# Patient Record
Sex: Male | Born: 1935 | Race: White | Hispanic: No | Marital: Married | Smoking: Former smoker
Health system: Southern US, Community
[De-identification: ages and names within clinical notes are randomized; demographics above are authoritative.]

## PROBLEM LIST (undated history)

## (undated) DIAGNOSIS — L989 Disorder of the skin and subcutaneous tissue, unspecified: Secondary | ICD-10-CM

## (undated) DIAGNOSIS — T4145XA Adverse effect of unspecified anesthetic, initial encounter: Secondary | ICD-10-CM

## (undated) DIAGNOSIS — C61 Malignant neoplasm of prostate: Secondary | ICD-10-CM

## (undated) DIAGNOSIS — N400 Enlarged prostate without lower urinary tract symptoms: Secondary | ICD-10-CM

## (undated) DIAGNOSIS — N529 Male erectile dysfunction, unspecified: Secondary | ICD-10-CM

## (undated) DIAGNOSIS — Z8489 Family history of other specified conditions: Secondary | ICD-10-CM

## (undated) DIAGNOSIS — I10 Essential (primary) hypertension: Secondary | ICD-10-CM

## (undated) DIAGNOSIS — M1990 Unspecified osteoarthritis, unspecified site: Secondary | ICD-10-CM

## (undated) DIAGNOSIS — I480 Paroxysmal atrial fibrillation: Secondary | ICD-10-CM

## (undated) DIAGNOSIS — J45909 Unspecified asthma, uncomplicated: Secondary | ICD-10-CM

## (undated) DIAGNOSIS — T8859XA Other complications of anesthesia, initial encounter: Secondary | ICD-10-CM

## (undated) DIAGNOSIS — I5042 Chronic combined systolic (congestive) and diastolic (congestive) heart failure: Secondary | ICD-10-CM

## (undated) DIAGNOSIS — K219 Gastro-esophageal reflux disease without esophagitis: Secondary | ICD-10-CM

## (undated) DIAGNOSIS — E039 Hypothyroidism, unspecified: Secondary | ICD-10-CM

## (undated) DIAGNOSIS — I2510 Atherosclerotic heart disease of native coronary artery without angina pectoris: Secondary | ICD-10-CM

## (undated) DIAGNOSIS — Z952 Presence of prosthetic heart valve: Secondary | ICD-10-CM

## (undated) DIAGNOSIS — I219 Acute myocardial infarction, unspecified: Secondary | ICD-10-CM

## (undated) DIAGNOSIS — R0989 Other specified symptoms and signs involving the circulatory and respiratory systems: Secondary | ICD-10-CM

## (undated) DIAGNOSIS — K602 Anal fissure, unspecified: Secondary | ICD-10-CM

## (undated) DIAGNOSIS — I509 Heart failure, unspecified: Secondary | ICD-10-CM

## (undated) DIAGNOSIS — H353130 Nonexudative age-related macular degeneration, bilateral, stage unspecified: Secondary | ICD-10-CM

## (undated) DIAGNOSIS — Z860100 Personal history of colon polyps, unspecified: Secondary | ICD-10-CM

## (undated) DIAGNOSIS — E669 Obesity, unspecified: Secondary | ICD-10-CM

## (undated) DIAGNOSIS — I350 Nonrheumatic aortic (valve) stenosis: Secondary | ICD-10-CM

## (undated) DIAGNOSIS — T7840XA Allergy, unspecified, initial encounter: Secondary | ICD-10-CM

## (undated) DIAGNOSIS — E785 Hyperlipidemia, unspecified: Secondary | ICD-10-CM

## (undated) DIAGNOSIS — Z951 Presence of aortocoronary bypass graft: Secondary | ICD-10-CM

## (undated) HISTORY — DX: Heart failure, unspecified: I50.9

## (undated) HISTORY — DX: Personal history of colonic polyps: Z86.010

## (undated) HISTORY — DX: Anal fissure, unspecified: K60.2

## (undated) HISTORY — DX: Atherosclerotic heart disease of native coronary artery without angina pectoris: I25.10

## (undated) HISTORY — PX: CYST EXCISION PERINEAL: SHX6278

## (undated) HISTORY — DX: Hyperlipidemia, unspecified: E78.5

## (undated) HISTORY — DX: Allergy, unspecified, initial encounter: T78.40XA

## (undated) HISTORY — DX: Paroxysmal atrial fibrillation: I48.0

## (undated) HISTORY — PX: COLONOSCOPY: SHX174

## (undated) HISTORY — DX: Other specified symptoms and signs involving the circulatory and respiratory systems: R09.89

## (undated) HISTORY — DX: Chronic combined systolic (congestive) and diastolic (congestive) heart failure: I50.42

## (undated) HISTORY — DX: Acute myocardial infarction, unspecified: I21.9

## (undated) HISTORY — PX: MULTIPLE TOOTH EXTRACTIONS: SHX2053

## (undated) HISTORY — DX: Personal history of colon polyps, unspecified: Z86.0100

## (undated) HISTORY — DX: Essential (primary) hypertension: I10

## (undated) HISTORY — PX: HAMMER TOE SURGERY: SHX385

## (undated) HISTORY — PX: JOINT REPLACEMENT: SHX530

## (undated) HISTORY — DX: Gastro-esophageal reflux disease without esophagitis: K21.9

## (undated) HISTORY — DX: Unspecified osteoarthritis, unspecified site: M19.90

## (undated) HISTORY — DX: Male erectile dysfunction, unspecified: N52.9

## (undated) HISTORY — DX: Unspecified asthma, uncomplicated: J45.909

## (undated) HISTORY — PX: CATARACT EXTRACTION W/ INTRAOCULAR LENS  IMPLANT, BILATERAL: SHX1307

## (undated) HISTORY — DX: Nonrheumatic aortic (valve) stenosis: I35.0

## (undated) HISTORY — DX: Malignant neoplasm of prostate: C61

## (undated) HISTORY — DX: Disorder of the skin and subcutaneous tissue, unspecified: L98.9

## (undated) HISTORY — DX: Benign prostatic hyperplasia without lower urinary tract symptoms: N40.0

## (undated) HISTORY — DX: Presence of aortocoronary bypass graft: Z95.1

## (undated) HISTORY — PX: PROSTATE BIOPSY: SHX241

## (undated) HISTORY — PX: MASTECTOMY SUBCUTANEOUS: SUR853

## (undated) HISTORY — PX: ORIF FINGER / THUMB FRACTURE: SUR932

## (undated) HISTORY — PX: ANUS SURGERY: SHX302

## (undated) HISTORY — DX: Obesity, unspecified: E66.9

---

## 2002-07-05 ENCOUNTER — Ambulatory Visit (HOSPITAL_BASED_OUTPATIENT_CLINIC_OR_DEPARTMENT_OTHER): Admission: RE | Admit: 2002-07-05 | Discharge: 2002-07-05 | Payer: Self-pay | Admitting: *Deleted

## 2004-09-21 ENCOUNTER — Encounter: Admission: RE | Admit: 2004-09-21 | Discharge: 2004-09-21 | Payer: Self-pay | Admitting: Neurology

## 2004-10-31 ENCOUNTER — Ambulatory Visit: Payer: Self-pay | Admitting: Family Medicine

## 2005-06-27 ENCOUNTER — Ambulatory Visit: Payer: Self-pay | Admitting: Family Medicine

## 2005-08-26 HISTORY — PX: CORONARY ARTERY BYPASS GRAFT: SHX141

## 2005-10-11 ENCOUNTER — Ambulatory Visit: Payer: Self-pay | Admitting: Family Medicine

## 2005-10-15 ENCOUNTER — Ambulatory Visit: Payer: Self-pay | Admitting: Family Medicine

## 2005-10-18 ENCOUNTER — Ambulatory Visit: Payer: Self-pay | Admitting: Family Medicine

## 2005-10-29 ENCOUNTER — Inpatient Hospital Stay (HOSPITAL_COMMUNITY): Admission: RE | Admit: 2005-10-29 | Discharge: 2005-11-04 | Payer: Self-pay | Admitting: Cardiovascular Disease

## 2005-10-29 HISTORY — PX: CARDIAC CATHETERIZATION: SHX172

## 2005-10-30 DIAGNOSIS — Z951 Presence of aortocoronary bypass graft: Secondary | ICD-10-CM | POA: Insufficient documentation

## 2005-10-30 HISTORY — DX: Presence of aortocoronary bypass graft: Z95.1

## 2005-10-31 ENCOUNTER — Encounter: Payer: Self-pay | Admitting: Internal Medicine

## 2005-11-21 ENCOUNTER — Encounter: Admission: RE | Admit: 2005-11-21 | Discharge: 2005-11-21 | Payer: Self-pay | Admitting: Cardiothoracic Surgery

## 2005-11-28 ENCOUNTER — Encounter (HOSPITAL_COMMUNITY): Admission: RE | Admit: 2005-11-28 | Discharge: 2006-02-26 | Payer: Self-pay | Admitting: Cardiology

## 2006-04-02 ENCOUNTER — Ambulatory Visit: Payer: Self-pay | Admitting: Family Medicine

## 2006-06-06 ENCOUNTER — Ambulatory Visit: Payer: Self-pay | Admitting: Family Medicine

## 2006-06-30 ENCOUNTER — Ambulatory Visit: Payer: Self-pay | Admitting: Family Medicine

## 2006-06-30 LAB — CONVERTED CEMR LAB
ALT: 27 units/L (ref 0–40)
AST: 25 units/L (ref 0–37)
Albumin: 3.5 g/dL (ref 3.5–5.2)
Alkaline Phosphatase: 59 units/L (ref 39–117)
BUN: 9 mg/dL (ref 6–23)
Basophils Absolute: 0.1 10*3/uL (ref 0.0–0.1)
Basophils Relative: 1 % (ref 0.0–1.0)
CO2: 28 meq/L (ref 19–32)
Calcium: 8.7 mg/dL (ref 8.4–10.5)
Chloride: 103 meq/L (ref 96–112)
Chol/HDL Ratio, serum: 4.6
Cholesterol: 158 mg/dL (ref 0–200)
Creatinine, Ser: 0.9 mg/dL (ref 0.4–1.5)
Eosinophil percent: 3.4 % (ref 0.0–5.0)
GFR calc non Af Amer: 89 mL/min
Glomerular Filtration Rate, Af Am: 107 mL/min/{1.73_m2}
Glucose, Bld: 87 mg/dL (ref 70–99)
HCT: 45.5 % (ref 39.0–52.0)
HDL: 34.7 mg/dL — ABNORMAL LOW (ref >39.0)
Hemoglobin: 14.9 g/dL (ref 13.0–17.0)
LDL Cholesterol: 95 mg/dL (ref 0–99)
Lymphocytes Relative: 24.7 % (ref 12.0–46.0)
MCHC: 32.8 g/dL (ref 30.0–36.0)
MCV: 84.3 fL (ref 78.0–100.0)
Monocytes Absolute: 0.7 10*3/uL (ref 0.2–0.7)
Monocytes Relative: 7.1 % (ref 3.0–11.0)
Neutro Abs: 6.1 10*3/uL (ref 1.4–7.7)
Neutrophils Relative %: 63.8 % (ref 43.0–77.0)
PSA: 0.82 ng/mL (ref 0.10–4.00)
Platelets: 304 10*3/uL (ref 150–400)
Potassium: 3.8 meq/L (ref 3.5–5.1)
RBC: 5.4 M/uL (ref 4.22–5.81)
RDW: 15.5 % — ABNORMAL HIGH (ref 11.5–14.6)
Sodium: 139 meq/L (ref 135–145)
TSH: 3.54 microintl units/mL (ref 0.35–5.50)
Total Bilirubin: 1.2 mg/dL (ref 0.3–1.2)
Total Protein: 7.6 g/dL (ref 6.0–8.3)
Triglyceride fasting, serum: 143 mg/dL (ref 0–149)
VLDL: 29 mg/dL (ref 0–40)
WBC: 9.6 10*3/uL (ref 4.5–10.5)

## 2007-06-01 ENCOUNTER — Encounter: Payer: Self-pay | Admitting: Family Medicine

## 2007-06-05 ENCOUNTER — Ambulatory Visit: Payer: Self-pay | Admitting: Family Medicine

## 2007-07-03 ENCOUNTER — Ambulatory Visit: Payer: Self-pay | Admitting: Family Medicine

## 2007-07-03 DIAGNOSIS — J309 Allergic rhinitis, unspecified: Secondary | ICD-10-CM | POA: Insufficient documentation

## 2007-07-03 DIAGNOSIS — Z872 Personal history of diseases of the skin and subcutaneous tissue: Secondary | ICD-10-CM

## 2007-07-03 DIAGNOSIS — E785 Hyperlipidemia, unspecified: Secondary | ICD-10-CM | POA: Insufficient documentation

## 2007-07-03 DIAGNOSIS — I252 Old myocardial infarction: Secondary | ICD-10-CM | POA: Insufficient documentation

## 2007-07-03 DIAGNOSIS — I10 Essential (primary) hypertension: Secondary | ICD-10-CM | POA: Insufficient documentation

## 2007-07-03 DIAGNOSIS — Z860100 Personal history of colon polyps, unspecified: Secondary | ICD-10-CM | POA: Insufficient documentation

## 2007-07-03 DIAGNOSIS — L570 Actinic keratosis: Secondary | ICD-10-CM

## 2007-07-03 DIAGNOSIS — E291 Testicular hypofunction: Secondary | ICD-10-CM

## 2007-08-10 ENCOUNTER — Telehealth: Payer: Self-pay | Admitting: Family Medicine

## 2007-10-21 ENCOUNTER — Telehealth (INDEPENDENT_AMBULATORY_CARE_PROVIDER_SITE_OTHER): Payer: Self-pay | Admitting: *Deleted

## 2007-10-21 ENCOUNTER — Ambulatory Visit: Payer: Self-pay | Admitting: Family Medicine

## 2007-11-20 ENCOUNTER — Telehealth: Payer: Self-pay | Admitting: Family Medicine

## 2007-12-30 ENCOUNTER — Encounter: Payer: Self-pay | Admitting: Family Medicine

## 2008-02-01 ENCOUNTER — Ambulatory Visit: Payer: Self-pay | Admitting: Family Medicine

## 2008-02-01 DIAGNOSIS — N401 Enlarged prostate with lower urinary tract symptoms: Secondary | ICD-10-CM | POA: Insufficient documentation

## 2008-02-01 DIAGNOSIS — M545 Low back pain, unspecified: Secondary | ICD-10-CM | POA: Insufficient documentation

## 2008-02-04 ENCOUNTER — Encounter: Payer: Self-pay | Admitting: Family Medicine

## 2008-02-08 ENCOUNTER — Encounter: Payer: Self-pay | Admitting: Internal Medicine

## 2008-02-08 ENCOUNTER — Encounter: Payer: Self-pay | Admitting: Family Medicine

## 2008-02-11 ENCOUNTER — Encounter: Payer: Self-pay | Admitting: Family Medicine

## 2008-02-12 ENCOUNTER — Telehealth (INDEPENDENT_AMBULATORY_CARE_PROVIDER_SITE_OTHER): Payer: Self-pay | Admitting: *Deleted

## 2008-05-13 ENCOUNTER — Ambulatory Visit: Payer: Self-pay | Admitting: Family Medicine

## 2008-05-13 DIAGNOSIS — S7000XA Contusion of unspecified hip, initial encounter: Secondary | ICD-10-CM

## 2008-05-16 ENCOUNTER — Encounter: Admission: RE | Admit: 2008-05-16 | Discharge: 2008-05-23 | Payer: Self-pay | Admitting: Family Medicine

## 2008-05-24 ENCOUNTER — Encounter: Payer: Self-pay | Admitting: Family Medicine

## 2008-06-06 ENCOUNTER — Ambulatory Visit: Payer: Self-pay | Admitting: Internal Medicine

## 2008-06-30 ENCOUNTER — Ambulatory Visit: Payer: Self-pay | Admitting: Internal Medicine

## 2008-06-30 ENCOUNTER — Encounter: Payer: Self-pay | Admitting: Internal Medicine

## 2008-06-30 HISTORY — PX: COLONOSCOPY: SHX174

## 2008-07-01 ENCOUNTER — Encounter: Payer: Self-pay | Admitting: Internal Medicine

## 2008-07-07 ENCOUNTER — Ambulatory Visit: Payer: Self-pay | Admitting: Family Medicine

## 2008-07-07 DIAGNOSIS — M1990 Unspecified osteoarthritis, unspecified site: Secondary | ICD-10-CM | POA: Insufficient documentation

## 2008-07-07 DIAGNOSIS — N400 Enlarged prostate without lower urinary tract symptoms: Secondary | ICD-10-CM

## 2008-09-09 ENCOUNTER — Ambulatory Visit: Payer: Self-pay | Admitting: Cardiothoracic Surgery

## 2008-09-21 ENCOUNTER — Ambulatory Visit: Payer: Self-pay | Admitting: Family Medicine

## 2008-09-21 DIAGNOSIS — IMO0002 Reserved for concepts with insufficient information to code with codable children: Secondary | ICD-10-CM | POA: Insufficient documentation

## 2008-12-12 ENCOUNTER — Ambulatory Visit: Payer: Self-pay | Admitting: Family Medicine

## 2008-12-12 DIAGNOSIS — R252 Cramp and spasm: Secondary | ICD-10-CM | POA: Insufficient documentation

## 2008-12-15 LAB — CONVERTED CEMR LAB: Vit D, 25-Hydroxy: 33 ng/mL (ref 30–89)

## 2008-12-26 ENCOUNTER — Telehealth: Payer: Self-pay | Admitting: Family Medicine

## 2009-01-10 ENCOUNTER — Encounter: Payer: Self-pay | Admitting: Family Medicine

## 2009-04-21 ENCOUNTER — Telehealth: Payer: Self-pay | Admitting: Family Medicine

## 2009-05-08 ENCOUNTER — Ambulatory Visit: Payer: Self-pay | Admitting: Internal Medicine

## 2009-05-08 DIAGNOSIS — R011 Cardiac murmur, unspecified: Secondary | ICD-10-CM | POA: Insufficient documentation

## 2009-05-08 DIAGNOSIS — R0989 Other specified symptoms and signs involving the circulatory and respiratory systems: Secondary | ICD-10-CM

## 2009-05-10 DIAGNOSIS — I25810 Atherosclerosis of coronary artery bypass graft(s) without angina pectoris: Secondary | ICD-10-CM | POA: Insufficient documentation

## 2009-05-19 ENCOUNTER — Telehealth: Payer: Self-pay | Admitting: Internal Medicine

## 2009-06-06 ENCOUNTER — Ambulatory Visit: Payer: Self-pay | Admitting: Family Medicine

## 2009-06-09 ENCOUNTER — Telehealth: Payer: Self-pay | Admitting: Internal Medicine

## 2009-06-15 ENCOUNTER — Encounter: Payer: Self-pay | Admitting: Internal Medicine

## 2009-06-15 ENCOUNTER — Ambulatory Visit: Payer: Self-pay

## 2009-08-15 ENCOUNTER — Ambulatory Visit: Payer: Self-pay | Admitting: Family Medicine

## 2009-08-15 DIAGNOSIS — L02619 Cutaneous abscess of unspecified foot: Secondary | ICD-10-CM

## 2009-08-15 DIAGNOSIS — L03119 Cellulitis of unspecified part of limb: Secondary | ICD-10-CM

## 2009-08-16 ENCOUNTER — Encounter: Payer: Self-pay | Admitting: Family Medicine

## 2009-08-17 ENCOUNTER — Ambulatory Visit: Payer: Self-pay | Admitting: Family Medicine

## 2009-09-08 ENCOUNTER — Ambulatory Visit: Payer: Self-pay | Admitting: Family Medicine

## 2009-09-08 LAB — CONVERTED CEMR LAB
Glucose, Urine, Semiquant: NEGATIVE
Protein, U semiquant: NEGATIVE
Specific Gravity, Urine: 1.02
Urobilinogen, UA: 0.2
WBC Urine, dipstick: NEGATIVE
pH: 5.5

## 2009-09-12 ENCOUNTER — Ambulatory Visit: Payer: Self-pay | Admitting: Family Medicine

## 2009-09-12 DIAGNOSIS — M109 Gout, unspecified: Secondary | ICD-10-CM | POA: Insufficient documentation

## 2009-09-12 LAB — CONVERTED CEMR LAB
ALT: 22 units/L (ref 0–53)
Albumin: 3.9 g/dL (ref 3.5–5.2)
Basophils Absolute: 0.1 10*3/uL (ref 0.0–0.1)
Basophils Relative: 0.9 % (ref 0.0–3.0)
Bilirubin, Direct: <0.1 mg/dL (ref 0.0–0.3)
Chloride: 106 meq/L (ref 96–112)
Creatinine, Ser: 0.78 mg/dL (ref 0.40–1.50)
Glucose, Bld: 89 mg/dL (ref 70–99)
Lymphocytes Relative: 22.5 % (ref 12.0–46.0)
Lymphs Abs: 2 10*3/uL (ref 0.7–4.0)
MCHC: 32.6 g/dL (ref 30.0–36.0)
Monocytes Absolute: 0.8 10*3/uL (ref 0.1–1.0)
PSA: 0.73 ng/mL (ref 0.10–4.00)
Potassium: 4.5 meq/L (ref 3.5–5.3)
Sodium: 141 meq/L (ref 135–145)
TSH: 4.67 microintl units/mL (ref 0.35–5.50)
Total Bilirubin: 0.7 mg/dL (ref 0.3–1.2)
Total Protein: 7.2 g/dL (ref 6.0–8.3)
WBC: 8.9 10*3/uL (ref 4.5–10.5)

## 2009-10-31 ENCOUNTER — Ambulatory Visit: Payer: Self-pay | Admitting: Internal Medicine

## 2009-11-17 ENCOUNTER — Telehealth: Payer: Self-pay | Admitting: Family Medicine

## 2009-11-26 ENCOUNTER — Encounter: Payer: Self-pay | Admitting: Internal Medicine

## 2009-11-30 ENCOUNTER — Telehealth (INDEPENDENT_AMBULATORY_CARE_PROVIDER_SITE_OTHER): Payer: Self-pay | Admitting: *Deleted

## 2009-12-04 ENCOUNTER — Ambulatory Visit: Payer: Self-pay | Admitting: Internal Medicine

## 2009-12-04 ENCOUNTER — Encounter (HOSPITAL_COMMUNITY): Admission: RE | Admit: 2009-12-04 | Discharge: 2010-01-25 | Payer: Self-pay | Admitting: Internal Medicine

## 2009-12-04 ENCOUNTER — Ambulatory Visit: Payer: Self-pay

## 2009-12-12 ENCOUNTER — Telehealth: Payer: Self-pay | Admitting: Internal Medicine

## 2010-05-30 ENCOUNTER — Ambulatory Visit: Payer: Self-pay | Admitting: Internal Medicine

## 2010-05-30 ENCOUNTER — Ambulatory Visit: Payer: Self-pay | Admitting: Family Medicine

## 2010-05-30 ENCOUNTER — Telehealth (INDEPENDENT_AMBULATORY_CARE_PROVIDER_SITE_OTHER): Payer: Self-pay | Admitting: *Deleted

## 2010-06-15 ENCOUNTER — Ambulatory Visit: Payer: Self-pay | Admitting: Internal Medicine

## 2010-06-22 ENCOUNTER — Encounter: Payer: Self-pay | Admitting: Internal Medicine

## 2010-06-22 LAB — CONVERTED CEMR LAB
ALT: 23 units/L (ref 0–53)
Alkaline Phosphatase: 65 units/L (ref 39–117)
Bilirubin, Direct: 0.2 mg/dL (ref 0.0–0.3)
Chloride: 104 meq/L (ref 96–112)
Creatinine, Ser: 0.8 mg/dL (ref 0.4–1.5)
Potassium: 4.3 meq/L (ref 3.5–5.1)
Total Bilirubin: 1.1 mg/dL (ref 0.3–1.2)
Total Protein: 6.8 g/dL (ref 6.0–8.3)

## 2010-08-13 ENCOUNTER — Encounter: Payer: Self-pay | Admitting: Family Medicine

## 2010-08-14 ENCOUNTER — Encounter: Payer: Self-pay | Admitting: Internal Medicine

## 2010-08-16 ENCOUNTER — Telehealth: Payer: Self-pay | Admitting: Internal Medicine

## 2010-08-24 ENCOUNTER — Ambulatory Visit
Admission: RE | Admit: 2010-08-24 | Discharge: 2010-08-24 | Payer: Self-pay | Source: Home / Self Care | Attending: Family Medicine | Admitting: Family Medicine

## 2010-08-26 HISTORY — PX: REPLACEMENT TOTAL KNEE BILATERAL: SUR1225

## 2010-08-26 HISTORY — PX: CATARACT EXTRACTION, BILATERAL: SHX1313

## 2010-09-05 ENCOUNTER — Other Ambulatory Visit: Payer: Self-pay | Admitting: Family Medicine

## 2010-09-05 ENCOUNTER — Ambulatory Visit
Admission: RE | Admit: 2010-09-05 | Discharge: 2010-09-05 | Payer: Self-pay | Source: Home / Self Care | Attending: Family Medicine | Admitting: Family Medicine

## 2010-09-05 ENCOUNTER — Telehealth: Payer: Self-pay | Admitting: Family Medicine

## 2010-09-05 LAB — CONVERTED CEMR LAB
Specific Gravity, Urine: 1.02
Urobilinogen, UA: 0.2
WBC Urine, dipstick: NEGATIVE
pH: 6.5

## 2010-09-05 LAB — CBC WITH DIFFERENTIAL/PLATELET
Basophils Absolute: 0.1 10*3/uL (ref 0.0–0.1)
Basophils Relative: 0.4 % (ref 0.0–3.0)
Eosinophils Absolute: 0.3 10*3/uL (ref 0.0–0.7)
Eosinophils Relative: 2.3 % (ref 0.0–5.0)
HCT: 42.9 % (ref 39.0–52.0)
Hemoglobin: 14.3 g/dL (ref 13.0–17.0)
Lymphocytes Relative: 18.8 % (ref 12.0–46.0)
Lymphs Abs: 2.5 10*3/uL (ref 0.7–4.0)
MCHC: 33.3 g/dL (ref 30.0–36.0)
MCV: 86.9 fl (ref 78.0–100.0)
Monocytes Absolute: 0.7 10*3/uL (ref 0.1–1.0)
Monocytes Relative: 5 % (ref 3.0–12.0)
Neutro Abs: 9.6 10*3/uL — ABNORMAL HIGH (ref 1.4–7.7)
Neutrophils Relative %: 73.5 % (ref 43.0–77.0)
Platelets: 281 10*3/uL (ref 150.0–400.0)
RBC: 4.93 Mil/uL (ref 4.22–5.81)
RDW: 15.4 % — ABNORMAL HIGH (ref 11.5–14.6)
WBC: 13.1 10*3/uL — ABNORMAL HIGH (ref 4.5–10.5)

## 2010-09-05 LAB — LIPID PANEL
Cholesterol: 130 mg/dL (ref 0–200)
HDL: 39.2 mg/dL (ref >39.00)
LDL Cholesterol: 75 mg/dL (ref 0–99)
Total CHOL/HDL Ratio: 3
Triglycerides: 78 mg/dL (ref 0.0–149.0)
VLDL: 15.6 mg/dL (ref 0.0–40.0)

## 2010-09-05 LAB — BASIC METABOLIC PANEL
BUN: 18 mg/dL (ref 6–23)
CO2: 28 mEq/L (ref 19–32)
Calcium: 8.7 mg/dL (ref 8.4–10.5)
Chloride: 103 mEq/L (ref 96–112)
Creatinine, Ser: 0.8 mg/dL (ref 0.4–1.5)
GFR: 100.38 mL/min (ref >60.00)
Glucose, Bld: 79 mg/dL (ref 70–99)
Potassium: 4.6 mEq/L (ref 3.5–5.1)
Sodium: 139 mEq/L (ref 135–145)

## 2010-09-05 LAB — TSH: TSH: 3.04 u[IU]/mL (ref 0.35–5.50)

## 2010-09-05 LAB — HEPATIC FUNCTION PANEL
ALT: 32 U/L (ref 0–53)
AST: 26 U/L (ref 0–37)
Albumin: 3.3 g/dL — ABNORMAL LOW (ref 3.5–5.2)
Alkaline Phosphatase: 67 U/L (ref 39–117)
Bilirubin, Direct: 0.1 mg/dL (ref 0.0–0.3)
Total Bilirubin: 0.7 mg/dL (ref 0.3–1.2)
Total Protein: 6.9 g/dL (ref 6.0–8.3)

## 2010-09-05 LAB — PSA: PSA: 0.93 ng/mL (ref 0.10–4.00)

## 2010-09-06 ENCOUNTER — Ambulatory Visit
Admission: RE | Admit: 2010-09-06 | Discharge: 2010-09-06 | Payer: Self-pay | Source: Home / Self Care | Attending: Family Medicine | Admitting: Family Medicine

## 2010-09-06 DIAGNOSIS — J209 Acute bronchitis, unspecified: Secondary | ICD-10-CM | POA: Insufficient documentation

## 2010-09-11 ENCOUNTER — Telehealth: Payer: Self-pay

## 2010-09-13 ENCOUNTER — Ambulatory Visit
Admission: RE | Admit: 2010-09-13 | Discharge: 2010-09-13 | Payer: Self-pay | Source: Home / Self Care | Attending: Family Medicine | Admitting: Family Medicine

## 2010-09-23 LAB — CONVERTED CEMR LAB
ALT: 38 units/L (ref 0–53)
AST: 37 units/L (ref 0–37)
Albumin: 3.4 g/dL — ABNORMAL LOW (ref 3.5–5.2)
Albumin: 3.5 g/dL (ref 3.5–5.2)
BUN: 11 mg/dL (ref 6–23)
BUN: 9 mg/dL (ref 6–23)
Bilirubin, Direct: 0.1 mg/dL (ref 0.0–0.3)
Bilirubin, Direct: 0.2 mg/dL (ref 0.0–0.3)
CO2: 29 meq/L (ref 19–32)
Calcium: 8.8 mg/dL (ref 8.4–10.5)
Calcium: 8.9 mg/dL (ref 8.4–10.5)
Chloride: 103 meq/L (ref 96–112)
Chloride: 106 meq/L (ref 96–112)
Cholesterol: 125 mg/dL (ref 0–200)
Cholesterol: 131 mg/dL (ref 0–200)
Creatinine, Ser: 0.7 mg/dL (ref 0.4–1.5)
Creatinine, Ser: 0.8 mg/dL (ref 0.4–1.5)
Eosinophils Absolute: 0.6 10*3/uL (ref 0.0–0.6)
GFR calc Af Amer: 143 mL/min
Glucose, Bld: 98 mg/dL (ref 70–99)
HCT: 41.9 % (ref 39.0–52.0)
HDL: 31.1 mg/dL — ABNORMAL LOW (ref >39.0)
HDL: 36.5 mg/dL — ABNORMAL LOW (ref >39.0)
Hemoglobin: 14.2 g/dL (ref 13.0–17.0)
Hemoglobin: 14.4 g/dL (ref 13.0–17.0)
Lymphocytes Relative: 25.2 % (ref 12.0–46.0)
MCHC: 33.7 g/dL (ref 30.0–36.0)
MCV: 86.5 fL (ref 78.0–100.0)
Neutro Abs: 5 10*3/uL (ref 1.4–7.7)
Neutro Abs: 5.1 10*3/uL (ref 1.4–7.7)
PSA: 0.75 ng/mL (ref 0.10–4.00)
Potassium: 4.2 meq/L (ref 3.5–5.1)
RDW: 13.9 % (ref 11.5–14.6)
Sodium: 139 meq/L (ref 135–145)
TSH: 3.27 microintl units/mL (ref 0.35–5.50)
Total Protein: 6.7 g/dL (ref 6.0–8.3)
Total Protein: 7.4 g/dL (ref 6.0–8.3)
Triglycerides: 103 mg/dL (ref 0–149)
VLDL: 21 mg/dL (ref 0–40)
WBC: 8.7 10*3/uL (ref 4.5–10.5)
WBC: 8.8 10*3/uL (ref 4.5–10.5)

## 2010-09-27 NOTE — Assessment & Plan Note (Signed)
Summary: 6 month rov./sl   Referring Provider:  Jethro Bolus Primary Provider:  Nelwyn Salisbury MD   History of Present Illness: Justin [name] is a 77 y/o male with a h/o HTN, HL and CAD s/p CABG 2007. Previously followed by Dr. Shelva Majestic we saw him fo rthe first time at the end of last year to establish long-term cardiac care.  Carotid u/s in 10/10: 0-39% bilaterally.   Had nuclear stress test 4/11 EF 54%. Inferior, inferoseptal and apical defect consistent with scar and possible soft tissue attenuation.  No signficiant ischemia.  Returns for f/u. 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 Remains active. Has lost 40 pounds over past year. No CP or dyspnea.  Compliant with meds but not taking BP regularly.    Most recent cholesterol TC 153, LDL 87 HDL 43 TG 116. HgBa1c 6.0 CRP 5.0  on Lipitor 10.   Current Medications (verified): 1)  Allegra 180 Mg Tabs (Fexofenadine Hcl) .[name].. 1 By Mouth Once Daily 2)  Aspirin 325 Mg Tabs (Aspirin) .[name].. 1 Qd 3)  Niacin 500 Mg Tabs (Niacin) .... Once Daily 4)  Fish Oil   Oil (Fish Oil) .... 3 By Mouth Once Daily 5)  Multivitamins   Tabs (Multiple Vitamin) .... 2 By Mouth Once Daily 6)  Enablex 7.5 Mg Xr24h-Tab (Darifenacin Hydrobromide) .... Once Daily 7)  Lipitor 20 Mg Tabs (Atorvastatin Calcium) .... Take One Tablet By Mouth Daily. 8)  Vitamin D 1000 Unit  Tabs (Cholecalciferol) .[name].. 1by Mouth Once Daily 9)  Androgel Pump 1 % Gel (Testosterone) .[name].. 10g Every Day 10)  Ketoconazole 2 % Crea (Ketoconazole) .... Apply Three Times A Day As Needed 11)  Cialis 5 Mg Tabs (Tadalafil) .... As Needed 12)  Diclofenac Sodium 50 Mg Tbec (Diclofenac Sodium) .... Two Times A Day 13)  Afrin Nasal Spray 0.05 % Soln (Oxymetazoline Hcl) .... Once Daily 14)  Ramipril 2.5 Mg Caps (Ramipril) .... Take One Capsule By Mouth Daily  Allergies (verified): 1)  ! Sulfa 2)  ! * Mold 3)  ! * Shrimp 4)  ! * Peanuts  Past History:  Past  Medical History: Last updated: 10/31/2009 Allergic rhinitis (sees Dr. Corinda Gubler) Hyperlipidemia CAD, sees Dr. Arvilla Meres    --s/p Myocardial infarction, hx of    --s/p CABG 2007 Carotid bruits    --carotid u/s 10/10: 0-39% bilaterally Colonic polyps, hx of (sees Dr. Marina Goodell) ED Hypertension precancerous skin lesions (sees Dr. Terri Piedra) Osteoarthritis, especially in left knee, sees Dr. Dorene Grebe Benign prostatic hypertrophy, sees Dr. Vonita Moss Obesity Gout  Review of Systems       As per HPI and past medical history; otherwise all systems negative.   Vital Signs:  Patient profile:   77 year old male Height:      68 inches Weight:      250 pounds BMI:     38.15 Pulse rate:   72 / minute Resp:     16 per minute BP sitting:   138 / 76  (right arm)  Vitals Entered By: Marrion Coy, CNA (May 30, 2010 3:22 PM)  Physical Exam  General:  Obese well appearing. no resp difficulty HEENT: normal Neck: supple. no JVD. Carotids 2+ bilat; bilteral radiated bruits. No lymphadenopathy or thryomegaly appreciated. Cor: PMI nondisplaced. Regular rate & rhythm. No rubs, gallops. 2/6  AS murmur. Lungs: clear Abdomen: obese soft, nontender, nondistended. Good bowel sounds. Extremities: no cyanosis, clubbing, rash, 1+ edema Neuro: alert &  orientedx3, cranial nerves grossly intact. moves all 4 extremities w/o difficulty. affect pleasant    Impression & Recommendations:  Problem # 1:  CAD, AUTOLOGOUS BYPASS GRAFT (ICD-414.02) Stable. No evidence of ischemia on recent Myoview. Continue current regimen.  Problem # 2:  HYPERLIPIDEMIA (ICD-272.4) Due for repeat lipids. Check CMET and lipids. Goal LDL < 70.   Problem # 3:  HYPERTENSION (ICD-401.9) BP mildly elevated; increase remipril to 5 once daily   Problem # 4:  MURMUR (ICD-785.2) AS murmur on exam. Check echo at next visit.   Other Orders: EKG w/ Interpretation (93000)  Patient Instructions: 1)  Your physician recommends  that you schedule a follow-up appointment in: 6 months with Dr Gala Romney 2)  Your physician has recommended you make the following change in your medication: increase Ramipril 5 mg a day 3)  Your physician recommends that you return for a FASTING lipid profile: and CMP 272.0  414.01 v58.69 4)  Your physician has requested that you have an echocardiogram.  Echocardiography is a painless test that uses sound waves to create images of your heart. It provides your doctor with information about the size and shape of your heart and how well your heart's chambers and valves are working.  This procedure takes approximately one hour. There are no restrictions for this procedure.  Due in 6 months Prescriptions: RAMIPRIL 5 MG CAPS (RAMIPRIL) one daily  #90 x 3   Entered by:   Charolotte Capuchin, RN   Authorized by:   Dolores Patty, MD, [facility]   Signed by:   Charolotte Capuchin, RN on 05/30/2010   Method used:   Electronically to        [facility]* (retail)       [address]       [place], [place]  [address]       Ph: [phone]       Fax: [phone]   RxID:   [id number]

## 2010-09-27 NOTE — Assessment & Plan Note (Signed)
Summary: continued cough/dm   Vital Signs:  Patient profile:   77 year old male Weight:      252 pounds O2 Sat:      96 % Temp:     98 degrees F Pulse rate:   70 / minute BP sitting:   130 / 74  (left arm) Cuff size:   large  Vitals Entered By: Pura Spice, RN (September 06, 2010 11:32 AM) CC: dry cough lingering on     History of Present Illness: Here for continued wheezing, chest congestion, and a dry cough after 3 weeks. he had a Zpack at the beginning which did not help. H ehad a prednisone shot which did help somewhat for about 24 hours. No fever or chest pain.   Allergies: 1)  ! Sulfa 2)  ! * Mold 3)  ! * Shrimp 4)  ! * Peanuts  Past History:  Past Medical History: Reviewed history from 10/31/2009 and no changes required. Allergic rhinitis (sees Dr. Corinda Gubler) Hyperlipidemia CAD, sees Dr. Arvilla Meres    --s/p Myocardial infarction, hx of    --s/p CABG 2007 Carotid bruits    --carotid u/s 10/10: 0-39% bilaterally Colonic polyps, hx of (sees Dr. Marina Goodell) ED Hypertension precancerous skin lesions (sees Dr. Terri Piedra) Osteoarthritis, especially in left knee, sees Dr. Dorene Grebe Benign prostatic hypertrophy, sees Dr. Vonita Moss Obesity Gout  Review of Systems  The patient denies anorexia, fever, weight loss, weight gain, vision loss, decreased hearing, hoarseness, chest pain, syncope, dyspnea on exertion, peripheral edema, headaches, hemoptysis, abdominal pain, melena, hematochezia, severe indigestion/heartburn, hematuria, incontinence, genital sores, muscle weakness, suspicious skin lesions, transient blindness, difficulty walking, depression, unusual weight change, abnormal bleeding, enlarged lymph nodes, angioedema, breast masses, and testicular masses.    Physical Exam  General:  Well-developed,well-nourished,in no acute distress; alert,appropriate and cooperative throughout examination Head:  Normocephalic and atraumatic without obvious abnormalities. No  apparent alopecia or balding. Eyes:  No corneal or conjunctival inflammation noted. EOMI. Perrla. Funduscopic exam benign, without hemorrhages, exudates or papilledema. Vision grossly normal. Ears:  External ear exam shows no significant lesions or deformities.  Otoscopic examination reveals clear canals, tympanic membranes are intact bilaterally without bulging, retraction, inflammation or discharge. Hearing is grossly normal bilaterally. Nose:  External nasal examination shows no deformity or inflammation. Nasal mucosa are pink and moist without lesions or exudates. Mouth:  Oral mucosa and oropharynx without lesions or exudates.  Teeth in good repair. Neck:  No deformities, masses, or tenderness noted. Lungs:  soft rhonchi only . Voice is hoarse    Impression & Recommendations:  Problem # 1:  ACUTE BRONCHITIS (ICD-466.0)  His updated medication list for this problem includes:    Hydromet 5-1.5 Mg/34ml Syrp (Hydrocodone-homatropine) .[name]... 1 tsp q 4 hours as needed pain    Levaquin 500 Mg Tabs (Levofloxacin) ..... Once daily  Orders: T-2 View CXR (71020TC)  Complete Medication List: 1)  Allegra 180 Mg Tabs (Fexofenadine hcl) .[name].. 1 by mouth once daily 2)  Aspirin 325 Mg Tabs (Aspirin) .[name].. 1 qd 3)  Niacin 500 Mg Tabs (Niacin) .... Once daily 4)  Fish Oil Oil (Fish oil) .... 3 by mouth once daily 5)  Multivitamins Tabs (Multiple vitamin) .... 2 by mouth once daily 6)  Enablex 7.5 Mg Xr24h-tab (Darifenacin hydrobromide) .... Once daily 7)  Lipitor 20 Mg Tabs (Atorvastatin calcium) .... Take one tablet by mouth daily. 8)  Vitamin D 1000 Unit Tabs (Cholecalciferol) .[name].. 1by mouth once daily 9)  Androgel Pump 1 %  Gel (Testosterone) .[name].. 10g every day 10)  Ketoconazole 2 % Crea (Ketoconazole) .... Apply three times a day as needed 11)  Cialis 5 Mg Tabs (Tadalafil) .... As needed 12)  Diclofenac Sodium 50 Mg Tbec (Diclofenac sodium) .... Two times a day 13)  Afrin Nasal Spray 0.05 % Soln  (Oxymetazoline hcl) .... Once daily 14)  Ramipril 5 Mg Caps (Ramipril) .... One daily 15)  Hydromet 5-1.5 Mg/62ml Syrp (Hydrocodone-homatropine) .[name].. 1 tsp q 4 hours as needed pain 16)  Levaquin 500 Mg Tabs (Levofloxacin) .... Once daily 17)  Prednisone (pak) 10 Mg Tabs (Prednisone) .... As directed for 12 days  Patient Instructions: 1)  get a CXR today.  Prescriptions: PREDNISONE (PAK) 10 MG TABS (PREDNISONE) as directed for 12 days  #1 x 0   Entered and Authorized by:   Nelwyn Salisbury MD   Signed by:   Nelwyn Salisbury MD on 09/06/2010   Method used:   Electronically to        [facility]* (retail)       [address]       [place], [place]  [address]       Ph: [phone]       Fax: [phone]   RxID:   [id number] LEVAQUIN 500 MG TABS (LEVOFLOXACIN) once daily  #10 x 0   Entered and Authorized by:   Nelwyn Salisbury MD   Signed by:   Nelwyn Salisbury MD on 09/06/2010   Method used:   Electronically to        [facility]* (retail)       [address]       [place], [place]  [address]       Ph: [phone]       Fax: [phone]   RxID:   [id number]    Orders Added: 1)  Est. Patient Level IV [23557] 2)  T-2 View CXR [71020TC]

## 2010-09-27 NOTE — Progress Notes (Signed)
Summary: Patients Blood Pressure Log  Patients Blood Pressure Log   Imported By: Roderic Ovens 12/22/2009 12:35:24  _____________________________________________________________________  External Attachment:    Type:   Image     Comment:   External Document

## 2010-09-27 NOTE — Progress Notes (Signed)
Summary: Pt called to check on status of med. Waiting at pharmacy  Phone Note Call from Patient Call back at Home Phone [phone]   Caller: Patient Call For: Nelwyn Salisbury MD Summary of Call: Pt cannot sleep on the prednisone right now, and would like RX to help him sleep. [facility] Initial call taken by: Lynann Beaver CMA AAMA,  September 11, 2010 9:36 AM  Follow-up for Phone Call        call in Temazepam 30 mg at bedtime , #30 with 2 rf  Follow-up by: Nelwyn Salisbury MD,  September 11, 2010 12:52 PM  Additional Follow-up for Phone Call Additional follow up Details #1::        Pt called back and said that he wants med called in to [facility] pharmacy asap. Pt is going on to pharmacy to wait for med. [phone] cell Additional Follow-up by: Lucy Antigua,  September 11, 2010 2:11 PM    Additional Follow-up for Phone Call Additional follow up Details #2::    rx called in and pt aware Follow-up by: Kyung Rudd, CMA,  September 11, 2010 2:25 PM  New/Updated Medications: TEMAZEPAM 30 MG CAPS (TEMAZEPAM) 1 tab at bedtime Prescriptions: TEMAZEPAM 30 MG CAPS (TEMAZEPAM) 1 tab at bedtime  #30 x 2   Entered by:   Kyung Rudd, CMA   Authorized by:   Nelwyn Salisbury MD   Signed by:   Kyung Rudd, CMA on 09/11/2010   Method used:   Telephoned to ...       [facility]* (retail)       [address]       [place], [place]  [address]       Ph: [phone]       Fax: [phone]   RxID:   [id number]

## 2010-09-27 NOTE — Assessment & Plan Note (Signed)
Summary: CPX/RCD/pt rsc/cjr   Vital Signs:  Patient profile:   77 year old male Height:      68 inches Weight:      274 pounds Temp:     97.7 degrees F oral Pulse rate:   86 / minute BP sitting:   144 / 72  (left arm) Cuff size:   large  Vitals Entered By: Alfred Levins, CMA (September 12, 2009 2:47 PM) CC: cpx   History of Present Illness: 77 yr old male for cpx. He is doing well and has no concerns. He switched from Dr. Shelva Majestic to Dr. Gala Romney this past year for Cardiology care, and he seems to be stable.   Current Medications (verified): 1)  Allegra 180 Mg Tabs (Fexofenadine Hcl) .[name].. 1 By Mouth Once Daily 2)  Aspirin 325 Mg Tabs (Aspirin) .[name].. 1 Qd 3)  Niacin 250 Mg  Tabs (Niacin) .[name].. 1 By Mouth Once Daily 4)  Fish Oil   Oil (Fish Oil) .... 3 By Mouth Once Daily 5)  Multivitamins   Tabs (Multiple Vitamin) .... 2 By Mouth Once Daily 6)  Enablex 7.5 Mg Xr24h-Tab (Darifenacin Hydrobromide) .... Once Daily 7)  Lipitor 20 Mg Tabs (Atorvastatin Calcium) .... Take One Tablet By Mouth Daily. 8)  Vitamin D 1000 Unit  Tabs (Cholecalciferol) .... 2 By Mouth Once Daily 9)  Nabumetone 500 Mg  Tabs (Nabumetone) .[name].. 1 Two Times A Day Pc Prn 10)  Diphenhydramine Hcl 12.5 Mg/23ml Elix (Diphenhydramine Hcl) .... As Needed 11)  Androgel Pump 1 % Gel (Testosterone) .[name].. 10g Every Day 12)  Ketoconazole 2 % Crea (Ketoconazole) .... Apply Three Times A Day As Needed  Allergies (verified): 1)  ! Sulfa 2)  ! * Mold 3)  ! * Shrimp 4)  ! * Peanuts  Past History:  Past Medical History: Allergic rhinitis (sees Dr. Corinda Gubler) Hyperlipidemia CAD, sees Dr. Arvilla Meres    --s/p Myocardial infarction, hx of    --s/p CABG 2007 Colonic polyps, hx of (sees Dr. Marina Goodell) ED Hypertension precancerous skin lesions (sees Dr. Terri Piedra) Osteoarthritis, especially in left knee, sees Dr. Dorene Grebe Benign prostatic hypertrophy, sees Dr. Vonita Moss Obesity Gout  Past Surgical History: Reviewed history  from 07/07/2008 and no changes required. colonoscopy 06-30-08 per Dr. Marina Goodell, repeat in 5 yrs Cataract extraction, bilateral  Family History: Reviewed history from 07/03/2007 and no changes required. Family History of CAD Male 1st degree relative <50  Social History: Reviewed history from 05/08/2009 and no changes required. 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    Physical Exam  General:  overweight-appearing.   Head:  Normocephalic and atraumatic without obvious abnormalities. No apparent alopecia or balding. Eyes:  No corneal or conjunctival inflammation noted. EOMI. Perrla. Funduscopic exam benign, without hemorrhages, exudates or papilledema. Vision grossly normal. Ears:  External ear exam shows no significant lesions or deformities.  Otoscopic examination reveals clear canals, tympanic membranes are intact bilaterally without bulging, retraction, inflammation or discharge. Hearing is grossly normal bilaterally. Nose:  External nasal examination shows no deformity or inflammation. Nasal mucosa are  pink and moist without lesions or exudates. Mouth:  Oral mucosa and oropharynx without lesions or exudates.  Teeth in good repair. Neck:  No deformities, masses, or tenderness noted. Chest Wall:  No deformities, masses, tenderness or gynecomastia noted. Lungs:  Normal respiratory effort,  chest expands symmetrically. Lungs are clear to auscultation, no crackles or wheezes. Heart:  Normal rate and regular rhythm. S1 and S2 normal without gallop, murmur, click, rub or other extra sounds. EKG is at his baseline with inferior and anteroseptal T wave changes Abdomen:  Bowel sounds positive,abdomen soft and non-tender without masses, organomegaly or hernias noted. Rectal:  No external abnormalities noted. Normal sphincter tone. No rectal masses or tenderness. Heme neg. Genitalia:  Testes bilaterally descended without nodularity, tenderness or masses. No scrotal masses or lesions. No penis lesions or urethral discharge. Prostate:  Prostate gland firm and smooth, no enlargement, nodularity, tenderness, mass, asymmetry or induration. Msk:  No deformity or scoliosis noted of thoracic or lumbar spine.   Pulses:  R and L carotid,radial,femoral,dorsalis pedis and posterior tibial pulses are full and equal bilaterally Extremities:  No clubbing, cyanosis, edema, or deformity noted with normal full range of motion of all joints.   Neurologic:  No cranial nerve deficits noted. Station and gait are normal. Plantar reflexes are down-going bilaterally. DTRs are symmetrical throughout. Sensory, motor and coordinative functions appear intact. Skin:  Intact without suspicious lesions or rashes Cervical Nodes:  No lymphadenopathy noted Axillary Nodes:  No palpable lymphadenopathy Inguinal Nodes:  No significant adenopathy Psych:  Cognition and judgment appear intact. Alert and cooperative with normal attention span and concentration. No apparent delusions, illusions, hallucinations   Impression & Recommendations:  Problem # 1:  WELL ADULT EXAM (ICD-V70.0)  Orders: Hemoccult Guaiac-1 spec.(in office) (82270) EKG w/ Interpretation (93000)  Complete Medication List: 1)  Allegra 180 Mg Tabs (Fexofenadine hcl) .[name].. 1 by mouth once daily 2)  Aspirin 325 Mg Tabs (Aspirin) .[name].. 1 qd 3)  Niacin 250 Mg  Tabs (Niacin) .[name].. 1 by mouth once daily 4)  Fish Oil Oil (Fish oil) .... 3 by mouth once daily 5)  Multivitamins Tabs (Multiple vitamin) .... 2 by mouth once daily 6)  Enablex 7.5 Mg Xr24h-tab (Darifenacin hydrobromide) .... Once daily 7)  Lipitor 20 Mg Tabs (Atorvastatin calcium) .... Take one tablet by mouth daily. 8)  Vitamin D 1000 Unit Tabs (Cholecalciferol) .... 2 by mouth once daily 9)  Nabumetone 500 Mg Tabs (Nabumetone) .[name].. 1 two times a day pc prn 10)  Diphenhydramine Hcl 12.5 Mg/27ml Elix (Diphenhydramine hcl) .... As needed 11)  Androgel Pump 1 % Gel (Testosterone) .[name].. 10g every day 12)  Ketoconazole 2 % Crea (Ketoconazole) .... Apply three times a day as needed 13)  Cialis 5 Mg Tabs (Tadalafil) .... As needed  Patient Instructions: 1)  It is important that you exercise reguarly at least 20 minutes 5 times a week. If you develop chest pain, have severe difficulty breathing, or feel very tired, stop exercising immediately and seek medical attention.  2)  You need to lose weight. Consider a lower calorie diet and regular exercise.  3)  Please schedule a follow-up appointment in 6 months .  Prescriptions: CIALIS 5 MG TABS (TADALAFIL) as needed  #10 x 11   Entered and Authorized by:   Nelwyn Salisbury MD   Signed by:   Nelwyn Salisbury MD on 09/12/2009   Method used:   Electronically to        [facility]* (  retail)       [address]       [place], [place]  [address]       Ph: [phone]       Fax: [phone]   RxID:   [id number]

## 2010-09-27 NOTE — Progress Notes (Signed)
Summary: kidney pain & out of town few weeks  Phone Note Call from Patient   Summary of Call: In [place] working.  Sharp pain above waist back area last pm & night before with urination.  Residue pain.  Will go to UC there.   Initial call taken by: Rudy Jew, RN,  November 17, 2009 10:42 AM

## 2010-09-27 NOTE — Assessment & Plan Note (Signed)
Summary: cpx/cjr   Vital Signs:  Patient profile:   77 year old male Height:      68 inches Weight:      246 pounds O2 Sat:      98 % Temp:     97.7 degrees F Pulse rate:   90 / minute BP sitting:   124 / 70  (left arm) Cuff size:   large  Vitals Entered By: Pura Spice, RN (September 13, 2010 9:07 AM) CC: cpx    History of Present Illness: 77 yr old male for a cpx. We saw him  a week ago for bronchitis, and we started him on Levaquin. A CXR at that time was clear. He now feels a lot better. Otherwise he is doing well.   Allergies: 1)  ! Sulfa 2)  ! * Mold 3)  ! * Shrimp 4)  ! * Peanuts  Past History:  Past Medical History: Reviewed history from 10/31/2009 and no changes required. Allergic rhinitis (sees Dr. Corinda Gubler) Hyperlipidemia CAD, sees Dr. Arvilla Meres    --s/p Myocardial infarction, hx of    --s/p CABG 2007 Carotid bruits    --carotid u/s 10/10: 0-39% bilaterally Colonic polyps, hx of (sees Dr. Marina Goodell) ED Hypertension precancerous skin lesions (sees Dr. Terri Piedra) Osteoarthritis, especially in left knee, sees Dr. Dorene Grebe Benign prostatic hypertrophy, sees Dr. Vonita Moss Obesity Gout  Past Surgical History: Reviewed history from 07/07/2008 and no changes required. colonoscopy 06-30-08 per Dr. Marina Goodell, repeat in 5 yrs Cataract extraction, bilateral  Past History:  Care Management: Cardiology:  Dr Gala Romney Gastroenterology:[facility] Ophthalmology: Dr Vernetta Honey  Orthopedics: Dr Dorene Grebe   Family History: Reviewed history from 07/03/2007 and no changes required. Family History of CAD Male 1st degree relative <50  Social History: Reviewed history from 05/08/2009 and no changes required. 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    Physical Exam  General:  overweight-appearing.   Head:  Normocephalic and atraumatic without obvious abnormalities. No apparent alopecia or balding. Eyes:  No corneal or conjunctival inflammation noted. EOMI. Perrla. Funduscopic exam benign, without hemorrhages, exudates or papilledema. Vision grossly normal. Ears:  External ear exam shows no significant lesions or deformities.  Otoscopic examination reveals clear canals, tympanic membranes are intact bilaterally without bulging, retraction, inflammation or discharge. Hearing is grossly normal bilaterally. Nose:  External nasal examination shows no deformity or inflammation. Nasal mucosa are pink and moist without lesions or exudates. Mouth:  Oral mucosa and oropharynx without lesions or exudates.  Teeth in good repair. Neck:  No deformities, masses, or tenderness noted. Chest Wall:  No deformities, masses, tenderness or gynecomastia noted. Lungs:  Normal respiratory effort, chest expands symmetrically. Lungs are clear to auscultation, no crackles or wheezes. Heart:  normal rate, regular rhythm, no gallop, no rub, no JVD, and no HJR.  Has a 2/6 SM at the aortic area and also over the mitral area  Abdomen:  Bowel sounds positive,abdomen soft and non-tender without masses, organomegaly or hernias noted. Genitalia:  Testes bilaterally descended without nodularity, tenderness or masses. No scrotal  masses or lesions. No penis lesions or urethral discharge. Msk:  No deformity or scoliosis noted of thoracic or lumbar spine.   Pulses:  R and L  carotid,radial,femoral,dorsalis pedis and posterior tibial pulses are full and equal bilaterally Extremities:  No clubbing, cyanosis, edema, or deformity noted with normal full range of motion of all joints.   Neurologic:  No cranial nerve deficits noted. Station and gait are normal. Plantar reflexes are down-going bilaterally. DTRs are symmetrical throughout. Sensory, motor and coordinative functions appear intact. Skin:  Intact without suspicious lesions or rashes Cervical Nodes:  No lymphadenopathy noted Axillary Nodes:  No palpable lymphadenopathy Inguinal Nodes:  No significant adenopathy Psych:  Cognition and judgment appear intact. Alert and cooperative with normal attention span and concentration. No apparent delusions, illusions, hallucinations   Impression & Recommendations:  Problem # 1:  WELL ADULT EXAM (ICD-V70.0)  Complete Medication List: 1)  Allegra 180 Mg Tabs (Fexofenadine hcl) .[name].. 1 by mouth once daily 2)  Aspirin 325 Mg Tabs (Aspirin) .[name].. 1 qd 3)  Niacin 500 Mg Tabs (Niacin) .... Once daily 4)  Fish Oil Oil (Fish oil) .... 3 by mouth once daily 5)  Multivitamins Tabs (Multiple vitamin) .... 2 by mouth once daily 6)  Enablex 7.5 Mg Xr24h-tab (Darifenacin hydrobromide) .... Once daily 7)  Lipitor 20 Mg Tabs (Atorvastatin calcium) .... Take one tablet by mouth daily. 8)  Vitamin D 1000 Unit Tabs (Cholecalciferol) .[name].. 1by mouth once daily 9)  Androgel Pump 1 % Gel (Testosterone) .[name].. 10g every day 10)  Ketoconazole 2 % Crea (Ketoconazole) .... Apply three times a day as needed 11)  Cialis 5 Mg Tabs (Tadalafil) .... As needed 12)  Diclofenac Sodium 50 Mg Tbec (Diclofenac sodium) .... Two times a day 13)  Ramipril 5 Mg Caps (Ramipril) .... One daily 14)  Hydromet 5-1.5 Mg/83ml Syrp (Hydrocodone-homatropine) .[name].. 1 tsp q 4 hours as needed pain 15)  Levaquin 500 Mg Tabs (Levofloxacin) .... Once daily 16)  Prednisone (pak) 10 Mg Tabs (Prednisone) .... As directed for 12  days 17)  Temazepam 30 Mg Caps (Temazepam) .[name].. 1 tab at bedtime  Patient Instructions: 1)  Please schedule a follow-up appointment in 6 months .  2)  It is important that you exercise reguarly at least 20 minutes 5 times a week. If you develop chest pain, have severe difficulty breathing, or feel very tired, stop exercising immediately and seek medical attention.  3)  You need to lose weight. Consider a lower calorie diet and regular exercise.    Orders Added: 1)  Est. Patient 65& > [99397]     Eye Exam  Dr French Ana  opth last exam 07-2010

## 2010-09-27 NOTE — Assessment & Plan Note (Signed)
Summary: f68m   Visit Type:  Follow-up Referring Provider:  Jethro Bolus Primary Provider:  Nelwyn Salisbury MD   History of Present Illness: Justin [name] is a 77 y/o male with a h/o HTN, HL and CAD s/p CABG 2007. Previously followed by Dr. Shelva Majestic we saw him fo rthe first time at the end of last year to establish long-term cardiac care.  Carotid u/s in 10/10: 0-39% bilaterally.   Overall doing well but functional status limited by L knee arthritis and planning to get knee replacement later this year. No CP does note that he gets quite SOB with moderate activity. Compliant with med. No orthopnea, PND or edema.   Most recent cholesterol TC 153, LDL 87 HDL 43 TG 116. HgBa1c 6.0 CRP 5.0  on Lipitor 10.   Hypertension History:      Positive major cardiovascular risk factors include male age 41 years old or older, hyperlipidemia, and hypertension.  Negative major cardiovascular risk factors include non-tobacco-user status.        Positive history for target organ damage include ASHD (either angina/prior MI/prior CABG).    Current Medications (verified): 1)  Allegra 180 Mg Tabs (Fexofenadine Hcl) .[name].. 1 By Mouth Once Daily 2)  Aspirin 325 Mg Tabs (Aspirin) .[name].. 1 Qd 3)  Niacin 500 Mg Tabs (Niacin) .... Once Daily 4)  Fish Oil   Oil (Fish Oil) .... 3 By Mouth Once Daily 5)  Multivitamins   Tabs (Multiple Vitamin) .... 2 By Mouth Once Daily 6)  Enablex 7.5 Mg Xr24h-Tab (Darifenacin Hydrobromide) .... Once Daily 7)  Lipitor 20 Mg Tabs (Atorvastatin Calcium) .... Take One Tablet By Mouth Daily. 8)  Vitamin D 1000 Unit  Tabs (Cholecalciferol) .[name].. 1by Mouth Once Daily 9)  Androgel Pump 1 % Gel (Testosterone) .[name].. 10g Every Day 10)  Ketoconazole 2 % Crea (Ketoconazole) .... Apply Three Times A Day As Needed 11)  Cialis 5 Mg Tabs (Tadalafil) .... As Needed 12)  Allergy 4 Mg Tabs (Chlorpheniramine Maleate) .... Once Daily 13)  Diclofenac Sodium 50 Mg Tbec (Diclofenac Sodium) .... Two Times A Day 14)  Afrin  Nasal Spray 0.05 % Soln (Oxymetazoline Hcl) .... Once Daily  Allergies (verified): 1)  ! Sulfa 2)  ! * Mold 3)  ! * Shrimp 4)  ! * Peanuts  Past History:  Past Medical History: Allergic rhinitis (sees Dr. Corinda Gubler) Hyperlipidemia CAD, sees Dr. Arvilla Meres    --s/p Myocardial infarction, hx of    --s/p CABG 2007 Carotid bruits    --carotid u/s 10/10: 0-39% bilaterally Colonic polyps, hx of (sees Dr. Marina Goodell) ED Hypertension precancerous skin lesions (sees Dr. Terri Piedra) Osteoarthritis, especially in left knee, sees Dr. Dorene Grebe Benign prostatic hypertrophy, sees Dr. Vonita Moss Obesity Gout  Review of Systems       As per HPI and past medical history; otherwise all systems negative.   Vital Signs:  Patient profile:   77 year old male Height:      68 inches Weight:      271 pounds BMI:     41.35 Pulse rate:   67 / minute Pulse rhythm:   irregular BP sitting:   152 / 68  (left arm)  Vitals Entered By: Laurance Flatten CMA (October 31, 2009 4:35 PM)  Physical Exam  General:  Gen: well appearing. no resp difficulty HEENT: normal Neck: supple. no JVD. Carotids 2+ bilat; no bruits. No lymphadenopathy or thryomegaly appreciated. Cor: PMI nondisplaced. Regular rate & rhythm. No rubs, gallops, murmur. Lungs:  clear Abdomen: obese soft, nontender, nondistended. Good bowel sounds. Extremities: no cyanosis, clubbing, rash, edema Neuro: alert & orientedx3, cranial nerves grossly intact. moves all 4 extremities w/o difficulty. affect pleasant    New Orders:     1)  Nuclear Stress Test (Nuc Stress Test)   Impression & Recommendations:  Problem # 1:  CAD, AUTOLOGOUS BYPASS GRAFT (ICD-414.02) Somewhat progressive exertional dyspnea. Unable to walk on treadmill well. Will plan Lexiscan Myoview. We did discuss the role of weight loss.   Problem # 2:  HYPERTENSION (ICD-401.9) Assessment: Deteriorated BP elevated. Will have him keep BP log. Suspect he will need anti-hypertensive  agent.   Other Orders: Nuclear Stress Test (Nuc Stress Test)  Hypertension Assessment/Plan:      The patient's hypertensive risk group is category C: Target organ damage and/or diabetes.  Today's blood pressure is 152/68.     Patient Instructions: 1)  Your physician recommends that you schedule a follow-up appointment in: 6 months 2)  Your physician has requested that you have an adenosine myoview.  For further information please visit [email or web address]  Please follow instruction sheet, as given.

## 2010-09-27 NOTE — Procedures (Signed)
Summary: Colonoscopy   Colonoscopy  Procedure date:  06/30/2008  Findings:      Location:  [facility].    Procedures Next Due Date:    Colonoscopy: 06/2013  Patient Name: Justin [name], Justin [name]. MRN:  Procedure Procedures: Colonoscopy CPT: [phone].    with polypectomy. CPT: [id number].  Personnel: Endoscopist: Wilhemina Bonito. Marina Goodell, MD.  Exam Location: Exam performed in Outpatient Clinic. Outpatient  Patient Consent: Procedure, Alternatives, Risks and Benefits discussed, consent obtained, from patient. Consent was obtained by the RN.  Indications  Surveillance of: Adenomatous Polyp(s). This is not an initial surveillance exam. Initial polypectomy was performed in 1997. Pathology of worst  polyp: tubular adenoma. Previous surveillance exam(s) in  2000, Previous surveillance exam(s) in  2004,  History  Current Medications: Patient is not currently taking Coumadin.  Pre-Exam Physical: Performed Jun 30, 2008. Cardio-pulmonary exam, Rectal exam, HEENT exam , Abdominal exam, Mental status exam WNL.  Comments: Pt. history reviewed/updated, physical exam performed prior to initiation of sedation?yes Exam Exam: Extent of exam reached: Cecum, extent intended: Cecum.  The cecum was identified by appendiceal orifice and IC valve. Patient position: on left side. Time to Cecum: 00:01:52. Time for Withdrawl: 00:09:43. Colon retroflexion performed. Images taken. ASA Classification: III. Tolerance: excellent.  Monitoring: Pulse and BP monitoring, Oximetry used. Supplemental O2 given.  Colon Prep Used Movi prep for colon prep. Prep results: excellent.  Sedation Meds: Patient assessed and found to be appropriate for moderate (conscious) sedation. Fentanyl 75 mcg. given IV. Versed 10 mg. given IV.  Findings POLYP: Ascending Colon, Maximum size: 5 mm. Procedure:  snare without cautery, removed, retrieved, Polyp sent to pathology. ICD9: Colon Polyps: 211.3.  NORMAL EXAM: Rectum to  Cecum.  - DIVERTICULOSIS: Descending Colon to Sigmoid Colon. ICD9: Diverticulosis, Colon: 562.10. Comments: marked changes.   Assessment  Diagnoses: 211.3: Colon Polyps.  x 1.  562.10: Diverticulosis, Colon.   Events  Unplanned Interventions: No intervention was required.  Unplanned Events: There were no complications. Plans Disposition: After procedure patient sent to recovery. After recovery patient sent home.  Scheduling/Referral: Colonoscopy, to Wilhemina Bonito. Marina Goodell, MD, in 5 years if medically fit ,    cc: Gershon Crane, MD    REPORT OF SURGICAL PATHOLOGY   Case #: [id number] Patient Name: Justin [name] Office Chart Number:  N/A   MRN: [id number] Pathologist: H. Hollice Espy, MD DOB/Age  October 18, 1933 (Age: 81)    Gender: M Date Taken:  06/30/2008 Date Received: 06/30/2008   FINAL DIAGNOSIS   ***MICROSCOPIC EXAMINATION AND DIAGNOSIS***   COLON, ASCENDING, POLYP, BIOPSY:  INFLAMMATORY POLYP WITH SURFACE ULCERATION. NO EVIDENCE ADENOMATOUS CHANGES, DYSPLASIA OR MALIGNANCY. SEE COMMENT.   COMMENT The biopsies show a polypoid fragment of inflamed colonic mucosa with focal surface erosion.  There is no evidence of adenomatous changes, dysplasia or malignancy identified.  The overall findings are most consistent with inflammatory polyp.  Clinical and endoscopic correlation is highly recommended.  (HCL:kv 07-01-08)   kv Date Reported:  07/01/2008     H. Hollice Espy, MD    July 01, 2008 MRN: [id number]    [name] [address] [place], [place]  [address]    Dear Mr. PENKALA,  I am pleased to inform you that the colon polyp(s) removed during your recent colonoscopy was (were) found to be benign (no cancer detected) upon pathologic examination.  I recommend you have a repeat colonoscopy examination in 5 years to look for recurrent polyps, as having colon polyps increases your risk for  having recurrent polyps or even colon cancer in the  future.  Should you develop new or worsening symptoms of abdominal pain, bowel habit changes or bleeding from the rectum or bowels, please schedule an evaluation with either your primary care physician or with me.  Additional information/recommendations:  _X_ No further action with gastroenterology is needed at this time. Please      follow-up with your primary care physician for your other healthcare      needs.   Please call us if you are having persistent problems or have questions about your condition that have not been fully answered at this time.  Sincerely,  Hilarie Fredrickson MD  This report was created from the original endoscopy report, which was reviewed and signed by the above listed endoscopist.

## 2010-09-27 NOTE — Letter (Signed)
Summary: [facility]   Imported By: Maryln Gottron 08/29/2010 13:33:19  _____________________________________________________________________  External Attachment:    Type:   Image     Comment:   External Document

## 2010-09-27 NOTE — Progress Notes (Signed)
Summary: BP readings   Phone Note Outgoing Call   Call placed by: Meredith Staggers, RN,  December 12, 2009 5:00 PM Call placed to: Patient Summary of Call: Pt had faxed in BP readings ranging from 120-170, per Dr Gala Romney start ramipril 2.5mg  daily pt aware and agreeable, pt also given myoview results.  Pt states he is in [place] on a job until July wants new rx sent to [facility] and he states his daughter will pick it up along w/his other meds and get it to him next week.  Rx sent pt will monitor BP and let us know if still elevated Meredith Staggers, RN  December 12, 2009 5:02 PM     New/Updated Medications: RAMIPRIL 2.5 MG CAPS (RAMIPRIL) Take one capsule by mouth daily Prescriptions: RAMIPRIL 2.5 MG CAPS (RAMIPRIL) Take one capsule by mouth daily  #30 x 6   Entered by:   Meredith Staggers, RN   Authorized by:   Dolores Patty, MD, [facility]   Signed by:   Meredith Staggers, RN on 12/12/2009   Method used:   Electronically to        [facility]* (retail)       [address]       [place], [place]  [id number]       Ph: [phone]       Fax: [phone]   RxID:   [id number]

## 2010-09-27 NOTE — Progress Notes (Signed)
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 He informed me that if you get sick on the ship it is a $200 fee just to get an antibotic. Please advise..... Initial call taken by: Kathrynn Speed CMA,  May 30, 2010 2:27 PM  Follow-up for Phone Call        call in 2 Zpacks  Follow-up by: Nelwyn Salisbury MD,  May 30, 2010 3:44 PM  Additional Follow-up for Phone Call Additional follow up Details #1::        2 Z-packs called into [facility]. Tried to add to Z-pack to med list wasn't able to due to another user in chart a [name], MD. Lft msg for pt that Rx was sent. Additional Follow-up by: Kathrynn Speed CMA,  May 30, 2010 4:16 PM

## 2010-09-27 NOTE — Progress Notes (Signed)
Summary: surg clearance  Phone Note Other Incoming   Summary of Call: received fax from pt stating he was probably going to have knee replacement surgery in the fall of 2012 and wanted to know about clearance, per Dr Gala Romney pt should be fine to have surgery but would like to see him the summer 2012 to make sure he is doing ok, left mess on pts ID VM Initial call taken by: Meredith Staggers, RN,  August 16, 2010 2:26 PM

## 2010-09-27 NOTE — Assessment & Plan Note (Signed)
Summary: Cardiology Nuclear Study  Nuclear Med Background Indications for Stress Test: Evaluation for Ischemia, Graft Patency   History: CABG, Echo, Heart Catheterization, Myocardial Perfusion Study  History Comments: 4/98 BJY:NWGNF inferior infarct, EF=57%; '98 Echo: EF=60%, mild LVH; '07 CABG x 4  Symptoms: DOE, Fatigue    Nuclear Pre-Procedure Cardiac Risk Factors: Carotid Disease, History of Smoking, Hypertension, Lipids, Obesity Caffeine/Decaff Intake: None NPO After: 7:00 PM Lungs: Clear.  O2 Sat 98% on RA. IV 0.9% NS with Angio Cath: 20g     IV Site: (L) AC IV Started by: Irean Hong RN Chest Size (in) 50     Height (in): 68 Weight (lb): 266 BMI: 40.59  Nuclear Med Study 1 or 2 day study:  1 day     Stress Test Type:  Eugenie Birks Reading MD:  Dietrich Pates, MD     Referring MD:  Arvilla Meres, MD Resting Radionuclide:  Technetium 62m Tetrofosmin     Resting Radionuclide Dose:  11 mCi  Stress Radionuclide:  Technetium 72m Tetrofosmin     Stress Radionuclide Dose:  33 mCi   Stress Protocol   Lexiscan: 0.4 mg   Stress Test Technologist:  [employer] CMA-N     Nuclear Technologist:  Domenic Polite CNMT  Rest Procedure  Myocardial perfusion imaging was performed at rest 45 minutes following the intravenous administration of Myoview Technetium 89m Tetrofosmin.  Stress Procedure  The patient received IV Lexiscan 0.4 mg over 15-seconds.  Myoview injected at 30-seconds.  There were no significant changes with lexiscan; occasional PVC's.  Quantitative spect images were obtained after a 45 minute delay.  QPS Raw Data Images:  Images wer motion corrected.  Soft tissue (diaphragm) underlies heart. Stress Images:  Defect in the inferior (base, mid, distal), inferoseptal(base) and apical walls.  Otherwise normal perfusion. Rest Images:  No signifciant change from the stress images. Subtraction (SDS):  No evidence of ischemia. Transient Ischemic Dilatation:  1.11  (Normal  <1.22)  Lung/Heart Ratio:  .35  (Normal <0.45)  Quantitative Gated Spect Images QGS EDV:  144 ml QGS ESV:  63 ml QGS EF:  56 % QGS cine images:  Mild inferoseptal hypokinesis.   Overall Impression  Exercise Capacity: Lexiscan protocol. BP Response: Normal blood pressure response. Clinical Symptoms: Atypical chest pain. ECG Impression: No significant ST segment change suggestive of ischemia. Overall Impression: Inferior, inferoseptal and apical defect consistent with scar and possible soft tissue attenuation.  nO signficiant ischemia. Overall Impression Comments: Conclusion does not appear to be different from previous scan but images were not available for review.  Appended Document: Cardiology Nuclear Study continue medical therapy.  Appended Document: Cardiology Nuclear Study pt aware

## 2010-09-27 NOTE — Progress Notes (Signed)
Summary: Nuclear Pre-Procedure  Phone Note Outgoing Call Call back at [facility] Phone [phone]   Call placed by: Stanton Kidney, EMT-P,  November 30, 2009 2:04 PM Call placed to: Patient Action Taken: Phone Call Completed Summary of Call: Reviewed information on Myoview Information Sheet (see scanned document for further details).  Spoke with Patient.    Nuclear Med Background Indications for Stress Test: Evaluation for Ischemia, Graft Patency   History: CABG, Echo, Heart Catheterization, Myocardial Infarction  History Comments: 4/98 MI '98 Echo: EF=60%, mild LVH '07 Heart Cath: 2V Dz > CABG  Symptoms: DOE, SOB    Nuclear Pre-Procedure Cardiac Risk Factors: Carotid Disease, History of Smoking, Hypertension, Lipids Height (in): 68

## 2010-09-27 NOTE — Assessment & Plan Note (Signed)
Summary: FLU SHOT / Tdap/ VACC // RS  Nurse Visit   Allergies: 1)  ! Sulfa 2)  ! * Mold 3)  ! * Shrimp 4)  ! * Peanuts  Immunizations Administered:  Tetanus Vaccine:    Vaccine Type: Tdap    Site: right deltoid    Mfr: GlaxoSmithKline    Dose: 0.5 ml    Route: IM    Given by: Kathrynn Speed CMA    Exp. Date: 06/14/2012    Lot #: [id number]    VIS given: 07/13/08 version given May 30, 2010.  Influenza Vaccine # 1:    Vaccine Type: Fluvax MCR    Site: left deltoid    Mfr: GlaxoSmithKline    Dose: 0.5 ml    Route: IM    Given by: Kathrynn Speed CMA    Exp. Date: 02/23/2011    Lot #: [id number]    VIS given: 03/20/10 version given May 30, 2010.  Flu Vaccine Consent Questions:    Do you have a history of severe allergic reactions to this vaccine? no    Any prior history of allergic reactions to egg and/or gelatin? no    Do you have a sensitivity to the preservative Thimersol? no    Do you have a past history of Guillan-Barre Syndrome? no    Do you currently have an acute febrile illness? no    Have you ever had a severe reaction to latex? no    Vaccine information given and explained to patient? yes  Orders Added: 1)  Tdap => 7yrs IM [90715] 2)  Admin 1st Vaccine [90471] 3)  Influenza Vaccine MCR [00025]

## 2010-09-27 NOTE — Progress Notes (Signed)
Summary: Need Another RX, Still Coughing  Phone Note Refill Request Call back at Home Phone [phone] Message from:  Patient on September 05, 2010 8:23 AM  Refills Requested: Medication #1:  HYDROMET 5-1.5 MG/5ML SYRP 1 tsp q 4 hours as needed pain.  Method Requested: Electronic Initial call taken by: Trixie Dredge,  September 05, 2010 8:23 AM  Follow-up for Phone Call        call in another bottle please  Follow-up by: Nelwyn Salisbury MD,  September 06, 2010 8:40 AM  Additional Follow-up for Phone Call Additional follow up Details #1::        called Additional Follow-up by: Pura Spice, RN,  September 06, 2010 9:28 AM    Prescriptions: Heath Lark 5-1.5 MG/5ML SYRP (HYDROCODONE-HOMATROPINE) 1 tsp q 4 hours as needed pain  #240 ml x 0   Entered by:   Pura Spice, RN   Authorized by:   Nelwyn Salisbury MD   Signed by:   Pura Spice, RN on 09/06/2010   Method used:   Telephoned to ...       [facility]* (retail)       [address]       [place], [place]  [id number]       Ph: [phone]       Fax: [phone]   RxID:   [id number]

## 2010-09-27 NOTE — Assessment & Plan Note (Signed)
Summary: cough/chest congestion/cjr   Vital Signs:  Patient profile:   77 year old male O2 Sat:      98 % Temp:     97.9 degrees F Pulse rate:   79 / minute BP sitting:   124 / 76  (left arm) Cuff size:   large  Vitals Entered By: Pura Spice, RN (August 24, 2010 9:39 AM) CC: cough congestion sore throat just completed z pk today taking otc mucinex   History of Present Illness: Here for one week of sinus pressure, PND, ST, and a dry cough. No fever. He just finshed a Zpack with no improvement.   Allergies: 1)  ! Sulfa 2)  ! * Mold 3)  ! * Shrimp 4)  ! * Peanuts  Past History:  Past Medical History: Reviewed history from 10/31/2009 and no changes required. Allergic rhinitis (sees Dr. Corinda Gubler) Hyperlipidemia CAD, sees Dr. Arvilla Meres    --s/p Myocardial infarction, hx of    --s/p CABG 2007 Carotid bruits    --carotid u/s 10/10: 0-39% bilaterally Colonic polyps, hx of (sees Dr. Marina Goodell) ED Hypertension precancerous skin lesions (sees Dr. Terri Piedra) Osteoarthritis, especially in left knee, sees Dr. Dorene Grebe Benign prostatic hypertrophy, sees Dr. Vonita Moss Obesity Gout  Review of Systems  The patient denies anorexia, fever, weight loss, weight gain, vision loss, decreased hearing, hoarseness, chest pain, syncope, dyspnea on exertion, peripheral edema, hemoptysis, abdominal pain, melena, hematochezia, severe indigestion/heartburn, hematuria, incontinence, genital sores, muscle weakness, suspicious skin lesions, transient blindness, difficulty walking, depression, unusual weight change, abnormal bleeding, enlarged lymph nodes, angioedema, breast masses, and testicular masses.    Physical Exam  General:  Well-developed,well-nourished,in no acute distress; alert,appropriate and cooperative throughout examination Head:  Normocephalic and atraumatic without obvious abnormalities. No apparent alopecia or balding. Eyes:  No corneal or conjunctival inflammation noted.  EOMI. Perrla. Funduscopic exam benign, without hemorrhages, exudates or papilledema. Vision grossly normal. Ears:  External ear exam shows no significant lesions or deformities.  Otoscopic examination reveals clear canals, tympanic membranes are intact bilaterally without bulging, retraction, inflammation or discharge. Hearing is grossly normal bilaterally. Nose:  External nasal examination shows no deformity or inflammation. Nasal mucosa are pink and moist without lesions or exudates. Mouth:  Oral mucosa and oropharynx without lesions or exudates.  Teeth in good repair. Hoarse  Neck:  No deformities, masses, or tenderness noted. Lungs:  Normal respiratory effort, chest expands symmetrically. Lungs are clear to auscultation, no crackles or wheezes.   Impression & Recommendations:  Problem # 1:  VIRAL URI (ICD-465.9)  His updated medication list for this problem includes:    Allegra 180 Mg Tabs (Fexofenadine hcl) .[name]... 1 by mouth once daily    Aspirin 325 Mg Tabs (Aspirin) .[name]... 1 qd    Diclofenac Sodium 50 Mg Tbec (Diclofenac sodium) .[name]..[name] Two times a day    Hydromet 5-1.5 Mg/23ml Syrp (Hydrocodone-homatropine) .[name]... 1 tsp q 4 hours as needed pain  Orders: Depo- Medrol 40mg  (J1030) Depo- Medrol 80mg  (J1040) Admin of Therapeutic Inj  intramuscular or subcutaneous (91478)  Complete Medication List: 1)  Allegra 180 Mg Tabs (Fexofenadine hcl) .[name].. 1 by mouth once daily 2)  Aspirin 325 Mg Tabs (Aspirin) .[name].. 1 qd 3)  Niacin 500 Mg Tabs (Niacin) .... Once daily 4)  Fish Oil Oil (Fish oil) .... 3 by mouth once daily 5)  Multivitamins Tabs (Multiple vitamin) .... 2 by mouth once daily 6)  Enablex 7.5 Mg Xr24h-tab (Darifenacin hydrobromide) .... Once daily 7)  Lipitor 20  Mg Tabs (Atorvastatin calcium) .... Take one tablet by mouth daily. 8)  Vitamin D 1000 Unit Tabs (Cholecalciferol) .[name].. 1by mouth once daily 9)  Androgel Pump 1 % Gel (Testosterone) .[name].. 10g every day 10)  Ketoconazole 2 %  Crea (Ketoconazole) .... Apply three times a day as needed 11)  Cialis 5 Mg Tabs (Tadalafil) .... As needed 12)  Diclofenac Sodium 50 Mg Tbec (Diclofenac sodium) .... Two times a day 13)  Afrin Nasal Spray 0.05 % Soln (Oxymetazoline hcl) .... Once daily 14)  Ramipril 5 Mg Caps (Ramipril) .... One daily 15)  Hydromet 5-1.5 Mg/36ml Syrp (Hydrocodone-homatropine) .[name].. 1 tsp q 4 hours as needed pain  Patient Instructions: 1)  Please schedule a follow-up appointment as needed .  Prescriptions: HYDROMET 5-1.5 MG/5ML SYRP (HYDROCODONE-HOMATROPINE) 1 tsp q 4 hours as needed pain  #240 x 0   Entered and Authorized by:   Nelwyn Salisbury MD   Signed by:   Nelwyn Salisbury MD on 08/24/2010   Method used:   Print then Give to Patient   RxID:   [id number]    Medication Administration  Injection # 1:    Medication: Depo- Medrol 40mg     Diagnosis: VIRAL URI (ICD-465.9)    Route: IM    Site: RUOQ gluteus    Exp Date: 02/2013    Lot #: [id number]    Mfr: Pharmacia    Patient tolerated injection without complications    Given by: Pura Spice, RN (August 24, 2010 10:27 AM)  Injection # 2:    Medication: Depo- Medrol 80mg     Diagnosis: VIRAL URI (ICD-465.9)    Route: IM    Site: L thigh    Exp Date: 02/2013    Lot #: [id number]    Mfr: Pharmacia    Patient tolerated injection without complications    Given by: Pura Spice, RN (August 24, 2010 10:28 AM)  Orders Added: 1)  Est. Patient Level IV [52841] 2)  Depo- Medrol 40mg  [J1030] 3)  Depo- Medrol 80mg  [J1040] 4)  Admin of Therapeutic Inj  intramuscular or subcutaneous [32440]

## 2010-09-27 NOTE — Letter (Signed)
Summary: [facility]   Imported By: Maryln Gottron 06/07/2010 09:57:35  _____________________________________________________________________  External Attachment:    Type:   Image     Comment:   External Document

## 2010-09-27 NOTE — Letter (Signed)
Summary: [facility] - [facility]  [facility], Main Office  [address]   [place], [place] [address]   Phone: [phone]  Fax: [phone]     June 22, 2010 MRN: [id number]   Justin [name] [address] [place], [place]  [address]   Dear [name],  We have reviewed your cholesterol results.  They are as follows:     Total Cholesterol:    119 (Desirable: less than 200)       HDL  Cholesterol:     34.50  (Desirable: greater than 40 for men and 50 for women)       LDL Cholesterol:       68  (Desirable: less than 100 for low risk and less than 70 for moderate to high risk)       Triglycerides:       85.0  (Desirable: less than 150)  Our recommendations include:  Look Good, continue current medications.   Call our office at the number listed above if you have any questions.  Lowering your LDL cholesterol is important, but it is only one of a large number of "risk factors" that may indicate that you are at risk for heart disease, stroke or other complications of hardening of the arteries.  Other risk factors include:   A.  Cigarette Smoking* B.  High Blood Pressure* C.  Obesity* D.   Low HDL Cholesterol (see yours above)* E.   Diabetes Mellitus (higher risk if your is uncontrolled) F.  Family history of premature heart disease G.  Previous history of stroke or cardiovascular disease    *These are risk factors YOU HAVE CONTROL OVER.  For more information, visit .  There is now evidence that lowering the TOTAL CHOLESTEROL AND LDL CHOLESTEROL can reduce the risk of heart disease.  The American Heart Association recommends the following guidelines for the treatment of elevated cholesterol:  1.  If there is now current heart disease and less than two risk factors, TOTAL CHOLESTEROL should be less than 200 and LDL CHOLESTEROL should be less than 100. 2.  If there is current heart disease or two or more risk factors, TOTAL CHOLESTEROL should be less than 200 and LDL  CHOLESTEROL should be less than 70.  A diet low in cholesterol, saturated fat, and calories is the cornerstone of treatment for elevated cholesterol.  Cessation of smoking and exercise are also important in the management of elevated cholesterol and preventing vascular disease.  Studies have shown that 30 to 60 minutes of physical activity most days can help lower blood pressure, lower cholesterol, and keep your weight at a healthy level.  Drug therapy is used when cholesterol levels do not respond to therapeutic lifestyle changes (smoking cessation, diet, and exercise) and remains unacceptably high.  If medication is started, it is important to have you levels checked periodically to evaluate the need for further treatment options.  Thank you,    [facility] Team

## 2010-10-09 ENCOUNTER — Telehealth: Payer: Self-pay | Admitting: *Deleted

## 2010-10-09 NOTE — Telephone Encounter (Signed)
Take Align (an OTC probiotic) daily

## 2010-10-09 NOTE — Telephone Encounter (Signed)
Pt took 12 days of Prednisone and Levofloxin, and could not sleep, and stopped.   Started 09/06/2010. He also had diarrhea, constipation, heavy gas and heart burn constantly.  Is taking Zantac 150 mg. Bid - tid.  Also, Gas X.  No loss of appetite, no nausea.  Going with bland diet did not help.  Needs suggestions. Assumed he wiped out the bacteria in his GI tract.  Yogurt is not helping.  Please call pt.

## 2010-10-09 NOTE — Telephone Encounter (Signed)
Pt given Dr. Claris Che recommendations.

## 2010-10-17 NOTE — Letter (Signed)
Summary: [name]   [name]   Imported By: Roderic Ovens 10/10/2010 14:23:42  _____________________________________________________________________  External Attachment:    Type:   Image     Comment:   External Document

## 2010-12-26 ENCOUNTER — Encounter: Payer: Self-pay | Admitting: Internal Medicine

## 2011-01-01 ENCOUNTER — Encounter: Payer: Self-pay | Admitting: Internal Medicine

## 2011-01-01 ENCOUNTER — Ambulatory Visit (INDEPENDENT_AMBULATORY_CARE_PROVIDER_SITE_OTHER): Payer: Medicare Other | Admitting: Internal Medicine

## 2011-01-01 VITALS — BP 120/70 | HR 71 | Ht 70.0 in | Wt 259.0 lb

## 2011-01-01 DIAGNOSIS — I359 Nonrheumatic aortic valve disorder, unspecified: Secondary | ICD-10-CM

## 2011-01-01 DIAGNOSIS — E785 Hyperlipidemia, unspecified: Secondary | ICD-10-CM

## 2011-01-01 DIAGNOSIS — I2510 Atherosclerotic heart disease of native coronary artery without angina pectoris: Secondary | ICD-10-CM

## 2011-01-01 DIAGNOSIS — I10 Essential (primary) hypertension: Secondary | ICD-10-CM

## 2011-01-01 NOTE — Progress Notes (Signed)
HPI:  Justin [name] is a 77 y/o male with a h/o HTN, HL and CAD s/p CABG 2007. Previously followed by Dr. Shelva Majestic we saw him fo rthe first time at the end of last year to establish long-term cardiac care.  Carotid u/s in 10/10: 0-39% bilaterally.   Had nuclear stress test 4/11 EF 54%. Inferior, inferoseptal and apical defect consistent with scar and possible soft tissue attenuation.  No signficiant ischemia.  Echo 5/09: Normal EF. Aortic valve sclerosis. No stenosis.  Returns for f/u. Overall doing well but says he feels tired. 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 No CP or dyspnea.  Compliant with meds but not taking BP regularly.   Most recent cholesterol 1/12 with Dr. Clent Ridges.  TC 130, LDL 75 HDL 39 TG 78 on Lipitor 20.    ROS: All systems negative except as listed in HPI, PMH and Problem List.  Past Medical History  Diagnosis Date  . Allergic     (sees Dr. Corinda Gubler)  . Hyperlipidemia   . CAD (coronary artery disease)   . Myocardial infarction   . S/P CABG (coronary artery bypass graft) 2007     Coronary artery bypass grafting x4 with left internal(Edward Tyrone Sage, MD)  . Carotid bruit     carotid u/s 10/10: 0.39% bilaterally  . Hx of colonic polyps     (sees Dr. Marina Goodell)  . ED (erectile dysfunction)   . HTN (hypertension)   . Precancerous skin lesion     (sees Dr. Terri Piedra)  . Osteoarthritis     especially in left knee, (sees Dr. Dorene Grebe )  . Benign prostatic hypertrophy     (sees Dr. Chester Holstein  . Obesity   . Gout   . GERD (gastroesophageal reflux disease)     Current Outpatient Prescriptions  Medication Sig Dispense Refill  . aspirin 325 MG tablet Take 325 mg by mouth daily.        [name] atorvastatin (LIPITOR) 20 MG tablet Take 20 mg by mouth daily.        . Cholecalciferol (VITAMIN D) 1000 UNITS capsule Take 1,000 Units by mouth daily.        [name] darifenacin (ENABLEX) 7.5 MG 24  hr tablet Take 7.5 mg by mouth daily.        [name] DICLOFENAC SODIUM PO Take by mouth. 50 MG TBEC. 2 Times daily.       . fexofenadine (ALLEGRA) 180 MG tablet Take 180 mg by mouth daily.        [name] ketoconazole (NIZORAL) 2 % cream Apply topically. Apply 3 times a day as needed.       . niacin 500 MG tablet Take 500 mg by mouth daily.        . Omega-3 Fatty Acids (FISH OIL PO) Take by mouth. 3 by mouth once daily.       . Prenatal Vit-Fe Fumarate-FA (M-VIT PO) Take by mouth. 2 daily.       . ramipril (ALTACE) 5 MG capsule Take 5 mg by mouth daily.        . tadalafil (CIALIS) 5 MG tablet Take 5 mg by mouth as needed.        . temazepam (RESTORIL) 30 MG capsule Take 30 mg by mouth at bedtime as needed.       . Testosterone (ANDROGEL PUMP TD) Place onto the skin. 1% Gel. 10g every  day.      [name] DISCONTD: HYDROcodone-homatropine (HYCODAN) 5-1.5 MG/5ML syrup Take by mouth. 1tsp every 4 hours as needed for pain.          PHYSICAL EXAM: Filed Vitals:   01/01/11 0838  BP: 120/70  Pulse: 71   Obese well appearing. no resp difficulty. I walked him in clinic and sats 95-95%. No change with ambulation HEENT: normal Neck: supple. no JVD. Carotids 2+ bilat; bilteral radiated bruits. No lymphadenopathy or thryomegaly appreciated. Cor: PMI nondisplaced. Regular rate & rhythm. No rubs, gallops. 2/6  AS murmur. Lungs: clear Abdomen: obese soft, nontender, nondistended. Good bowel sounds. Extremities: no cyanosis, clubbing, rash, 1+ edema Neuro: alert & orientedx3, cranial nerves grossly intact. moves all 4 extremities w/o difficulty. affect pleasant     ECG: Sinus rhythm 71 with 1AVB ( ) Anteroseptal Qs   ASSESSMENT & PLAN:

## 2011-01-01 NOTE — Assessment & Plan Note (Signed)
AS murmur seems to have progressed. Will check echo. Suspect it is in mild to moderate range.

## 2011-01-01 NOTE — Assessment & Plan Note (Signed)
No evidence of ischemia. Continue current regimen.   

## 2011-01-01 NOTE — Assessment & Plan Note (Signed)
Blood pressure well controlled. Continue current regimen.  

## 2011-01-01 NOTE — Assessment & Plan Note (Signed)
Lipids look good. Continue atorva 20. F/u with Dr. Clent Ridges.

## 2011-01-01 NOTE — Patient Instructions (Signed)

## 2011-01-11 ENCOUNTER — Telehealth: Payer: Self-pay | Admitting: Internal Medicine

## 2011-01-11 ENCOUNTER — Ambulatory Visit (HOSPITAL_COMMUNITY): Payer: Medicare Other | Attending: Family Medicine | Admitting: Radiology

## 2011-01-11 DIAGNOSIS — I359 Nonrheumatic aortic valve disorder, unspecified: Secondary | ICD-10-CM

## 2011-01-11 DIAGNOSIS — E785 Hyperlipidemia, unspecified: Secondary | ICD-10-CM | POA: Insufficient documentation

## 2011-01-11 DIAGNOSIS — E669 Obesity, unspecified: Secondary | ICD-10-CM | POA: Insufficient documentation

## 2011-01-11 DIAGNOSIS — I080 Rheumatic disorders of both mitral and aortic valves: Secondary | ICD-10-CM | POA: Insufficient documentation

## 2011-01-11 DIAGNOSIS — I10 Essential (primary) hypertension: Secondary | ICD-10-CM | POA: Insufficient documentation

## 2011-01-11 DIAGNOSIS — I079 Rheumatic tricuspid valve disease, unspecified: Secondary | ICD-10-CM | POA: Insufficient documentation

## 2011-01-11 NOTE — Discharge Summary (Signed)
NAMEJOEANGEL, JEANPAUL NO.:  [id number]   MEDICAL RECORD NO.:  [id number]          PATIENT TYPE:  INP   LOCATION:  2011                         FACILITY:  [facility]   PHYSICIAN:  Sheliah Plane, MD    DATE OF BIRTH:  1934/02/19   DATE OF ADMISSION:  10/29/2005  DATE OF DISCHARGE:                                 DISCHARGE SUMMARY   ADMISSION DIAGNOSIS:  Coronary occlusive disease.   PAST MEDICAL HISTORY/DISCHARGE DIAGNOSES:  1.  Hyperlipidemia.  2.  Remote tobacco abuse.  3.  Hammer toe repairs bilaterally.  4.  Drainage of anal cyst.  5.  Repair of right thumb injury.  6.  Bilateral subcutaneous mastectomies.  7.  Coronary occlusive disease, status post coronary artery bypass grafting      times four.   ALLERGIES:  SULFA which causes redness in the face.   BRIEF HISTORY:  The patient is a 77 year old male, status post myocardial  infarction approximately 8 years ago. He was treated medically and had done  well since that time, although approximately two years ago the patient began  to have vague onset of exertional anginal symptoms. He has noted in the fall  of 2006 since that time these symptoms have began to progress with less  activity. He also noted several episodes of gas after eating which also  have been getting worse over the past several months. A stress echo was done  in September 2006 which showed some thickening of the aortic valve, but  without stenosis. He had a posterior wall abnormality and question of a peri-  infarct ischemia. Secondary to these findings the patient was admitted on  October 29, 2005, for an elective cardiac catheterization by Dr. Allyson Sabal.   HOSPITAL COURSE:  The patient was admitted and taken for elective cardiac  catheterization on October 29, 2005, by Dr. Allyson Sabal. This revealed multi-vessel  coronary artery disease with a patent foramen ovale. Secondary to these  findings, Dr. Tyrone Sage of the CVTS service was consulted regarding  surgical  revascularization. Dr. Tyrone Sage evaluated the patient on October 29, 2005, and  it was his opinion that the patient should proceed with coronary artery  bypass graft.   The patient was taken to the OR on October 30, 2005, for coronary artery bypass  grafting times four. The left internal mammary artery was grafted to the  LAD, saphenous vein graft to the diagonal, saphenous vein graft was grafted  to the first obtuse marginal, and saphenous vein graft was grafted to the  right coronary artery. A closure of the patent foramen ovale was also  performed at the same time. Endoscopic vessel harvesting was performed on  the right lower extremity. The patient tolerated the procedure well, was  hemodynamically stable immediately postoperatively. The patient was  transferred from the OR to the SICU in stable condition. The patient was  extubated without complications and woke up from anesthesia neurologically  intact.   The patient's hospital course has progressed as expected. He has been volume  overloaded postoperatively and has been diuresed accordingly. All invasive  lines and chest tubes have  been discontinued in a routine manner. On  postoperative day one the patient was afebrile with stable vital signs and  maintaining a normal sinus rhythm. He also began cardiac rehab on  postoperative day one and has continued to increased his tolerance to a  satisfactory level at this time. The patient was transferred without  difficulty to  unit 2000 on postoperative day two. At this time he remains  afebrile with stable vital signs and continues to maintain a normal sinus  rhythm. His chest x-ray is stable, and his wounds are healing well. The  patient is in stable condition at this time and as long as he continues to  progress in the current manner, should be ready for discharge within the  next one or two days pending morning round re-evaluations.   LABORATORY DATA:  CBC on November 01, 2005,  revealed hemoglobin of 8, hematocrit  23.8, white count 12.3, platelet count 117,000. BNP on November 01, 2005, showed  sodium of 137, potassium 3.4, BUN 14, creatinine 0.7, glucose 123.   CONDITION ON DISCHARGE:  Improved.   DISCHARGE INSTRUCTIONS:   MEDICATIONS:  1.  Aspirin 325 mg daily.  2.  Toprol XL 25 mg daily.  3.  Lipitor 10 mg daily.  4.  Folic acid 1 mg daily.  5.  Tylox one to two q.4-6h. p.r.n. pain.   ACTIVITY:  No driving or lifting more than 10 pounds until after three weeks  and the patient should continue daily breathing and walking exercises.   DIET:  Low salt, low fat.   WOUND CARE:  The patient may shower daily and clean incisions with soap and  water. If wound problems the patient should contact the CVTS office.   FOLLOWUP APPOINTMENT:  1.  With Dr. Rosalie Doctor patient has been instructed to contact his office      for an appointment two weeks after discharge.  2.  With [facility] one hour prior to the appointment with      Dr. Tyrone Sage for PA and lateral      chest x-ray.  3.  With Dr. Tyrone Sage three weeks after discharge. The CVTS office to      contact the patient with the date and time of this appointment.      Pecola Leisure, [place]      Sheliah Plane, MD  Electronically Signed    AY/MEDQ  D:  11/01/2005  T:  11/03/2005  Job:  [id number]   cc:   Sheliah Plane, MD  [address]  [place]  [place] [address]   Macarthur Critchley. Shelva Majestic, M.D.  Fax: [phone]

## 2011-01-11 NOTE — Cardiovascular Report (Signed)
NAMEMarland Kitchen  Justin [name], Justin [name] NO.:  [id number]   MEDICAL RECORD NO.:  [id number]          PATIENT TYPE:  OIB   LOCATION:  2899                         FACILITY:  [facility]   PHYSICIAN:  Nanetta Batty, M.D.   DATE OF BIRTH:  12/31/1933   DATE OF PROCEDURE:  10/29/2005  DATE OF DISCHARGE:                              CARDIAC CATHETERIZATION   Justin [name] is a 77 year old white male, a patient of Dr. Shelva Majestic, referred  for diagnostic coronary arteriogram.  He has a history of GERD,  hyperlipidemia, hypertension, and progressive effort angina.  He had a  silent MI in the past and an abnormal stress echo last year.  He presents  now for a diagnostic coronary angiography to define his anatomy and to rule  out an ischemic etiology.   PROCEDURE DESCRIPTION:  The patient was brought to the second floor [facility] cardiac catheterization lab in the postabsorptive state.  He was  premedicated with p.o. Valium.  His right groin was prepped and shaved in  the usual sterile fashion.  Xylocaine 1% was used for local anesthesia.  A 6  French sheath was inserted into the right femoral artery using standard  Seldinger technique.  Six French right and left Judkins diagnostic catheters  along with a 6 French pigtail catheter and a 6 [place] Q35 guide catheter  were used for selective coronary angiography, left ventriculography,  subselective left internal mammary artery angiography and distal abdominal  aortography.  Isovue dye was used for the entirety of the case.  Retrograde  aorta, ventricular and pullback pressures were recorded.   HEMODYNAMICS:  See catheterization lab flow.  There was no pullback gradient  noted.   SELECTIVE CORONARY ANGIOGRAPHY:  1.  The left main was inferiorly directed and most likely posteriorly      directed in order to engage with diagnostic catheters.  A 6 [place] Q35      catheter was used to partially engage the left main, which was short but      normal.  2.  LAD:  The LAD had a high-grade 90% segmental lesion proximally until the      first large diagonal branch.  3.  Left circumflex:  The circumflex was codominant.  The first OM branch      had a 50-60% proximal stenosis.  4.  RCA:  This appeared to be a codominant vessel, but clearly the dominance      was mostly left.  The right was occluded in its midportion and      recanalized with competitive flow distally.  5.  Left internal mammary artery:  This vessel was subselectively visualized      and was widely patent.  It appeared suitable for use during coronary      artery bypass grafting.   LEFT VENTRICULOGRAPHY:  RAO  left ventriculogram was performed using 25 mL  of Visipaque dye at 12 mL/sec.  The overall LVEF was estimated at  approximately 50% with inferoapical severe hypokinesia akinesia from his old  silent MI.   DISTAL ABDOMINAL AORTOGRAPHY:  Distal abdominal aortogram was performed  using 25 mL of Visipaque dye at 20 mL/sec.  The renal arteries were widely  patent.  The infrarenal abdominal aorta and iliac bifurcation were free of  significant atherosclerotic changes.   IMPRESSION:  Justin [name] has two-vessel disease primarily involving his left  anterior descending artery, which is large and wraps the apex, and his  right, which is old and occluded.  His left anterior descending artery  geometry is somewhat problematic with regard to intervention because of the  takeoff of the left main and the proximity of the lesion to the first large  diagonal branch.  After review of his anatomy with my partner, Justin Solo.  [name], M.D., the consensus opinion is that he will require coronary artery  bypass grafting, which I think is a safer procedure for him and has a long-  term more durable result.  I have discussed this with Dr. Shelva Majestic.  Dr.  Tyrone Sage will see the patient and will post him for tomorrow.   The patient did develop some chest pain at the end of the procedure, which   responded to sublingual nitroglycerin and Valium.   The sheath was removed and pressure was held on the groin to achieve  hemostasis.  The patient left the lab in stable condition.      Nanetta Batty, M.D.  Electronically Signed     JB/MEDQ  D:  10/29/2005  T:  10/30/2005  Job:  [id number]   cc:   Second Floor Cardiac Catheterization Lab   Macarthur Critchley. Shelva Majestic, M.D.  Fax: [phone]

## 2011-01-11 NOTE — Op Note (Signed)
NAMEDAILEN, MCCLISH NO.:  [id number]   MEDICAL RECORD NO.:  [id number]          PATIENT TYPE:  INP   LOCATION:  2315                         FACILITY:  [facility]   PHYSICIAN:  Sheliah Plane, MD    DATE OF BIRTH:  1934/06/17   DATE OF PROCEDURE:  10/30/2005  DATE OF DISCHARGE:                                 OPERATIVE REPORT   PREOPERATIVE DIAGNOSIS:  Coronary occlusive disease with progressive angina.   POSTOPERATIVE DIAGNOSES:  Coronary occlusive disease with progressive angina  plus patent foramen ovale.   PROCEDURE PERFORMED:  Coronary artery bypass grafting x4 with left internal  mammary to the left anterior descending coronary artery, reverse saphenous  vein graft to the diagonal coronary artery, reverse saphenous vein graft to  the first obtuse marginal coronary artery, reverse saphenous vein graft to  the right coronary artery and closure of patent foramen with right thigh and  lower leg endo-vein harvesting.   SURGEON:  Dr. Tyrone Sage.   FIRST ASSISTANT:  Theda Belfast, P.A.   BRIEF HISTORY:  The patient is a 77 year old male who had inferior  myocardial infarction with occlusion of the right coronary artery  approximately eight years ago.  He had done well until the last three to  four months when he began having recurrent anginal symptoms which have been  progressive in nature, usually associated with exertion but coming on more  frequently.  He also was noted to have a murmur.  Echocardiogram in  September showed some sclerosis of the aortic valve but without stenosis.  Because of his progressive anginal symptoms, he was referred for a cardiac  catheterization which was performed by Dr. Allyson Sabal.  This demonstrated no  aortic gradient and coronary occlusive disease with a complex proximal LAD  lesion involving the diagonal, a 70% first obtuse marginal vessel.  The  patient had a very short left main.  The circumflex proper had luminal  irregularities but no significant stenosis.  The right coronary artery was  occluded in the proximal third with collateral filling of a small distal  coronary system.  Overall, ventricular function was preserved.  The patient  was, coronary artery bypass grafting was recommended to the patient who  agreed and signed informed consent.  In addition, because of the patient's  murmur, it was decided to use TE during the operative procedure.  With this,  the patient was found to have some calcification of the aortic valve but  without any significant stenosis and good opening of the valve orifice.  He  was also found to have a patent foramen with left to right shunting.  Closure of the patent foramen was also undertaken.   DESCRIPTION OF PROCEDURE:  With Swan-Ganz and arterial line monitors in  place, the patient underwent general endotracheal anesthesia without  incident.  The skin of the chest and legs was prepped with Betadine and  draped in the usual sterile manner.  Using the Guidant endo-vein harvesting  system, the vein was harvested from the right thigh and the upper portion of  the right lower leg.  The vein was of  good quality and caliber.  A median  sternotomy was performed.  The left internal mammary artery was dissected  down to its pedicle graft.  The distal artery was divided, had good free  flow.  The pericardium was opened.  Overall ventricular function appeared  preserved.  The patient was systemically heparinized.  The ascending aorta  was cannulated.  The superior and inferior vena caval cannulas were placed  with cable tapes.  A TE was placed and the findings were noted above and in  a separate note dictated by anesthesia.  The patient was placed on  cardiopulmonary bypass, 2.4 liters per minute per m2.  The sites of  anastomosis were selected and dissected out of the epicardium.  The  patient's body temperature was cooled to 30 degrees.  The aortic cross clamp  was applied  and 500 cc of cold blood potassium cardioplegia was administered  with rapid diastolic arrest of the heart.  The myocardial septal temperature  was monitored throughout the cross clamp.  Attention was turned first to the  distal right coronary artery which was opened and admitted a 1.5 mm probe.  The artery appeared larger than appreciated on the catheter films.  Using a  running 7-0 Prolene, a distal anastomosis was performed with a second  reverse saphenous vein graft.  Additional cold blood cardioplegia was  administered down the vein grafts.  Attention was then turned to the first  obtuse marginal coronary artery which was opened and admitted a 1.5 mm  probe.  The vessel was 1.8 mm in size.  Using a running 7-0 Prolene, distal  anastomosis was performed.  The diagonal coronary artery was then opened and  was 1.5 mm in size using a running 7-0 Prolene.  A distal anastomosis was  performed.  The left anterior descending coronary artery was opened in the  mid portion using a running 8-0 Prolene.  The left internal mammary artery  was anastomosed to the left anterior descending coronary artery.  With  release of bulldog on the mammary artery, there was rise of myocardial  septal temperature.  The bulldog was replaced back on the mammary artery.  The fascia at the mammary artery was tacked to the epicardium.  Additional  cold blood cardioplegia was administered.  Cable tapes were secured and an  oblique incision was made in the right atrium.  This gave exposure to the  intra-atrial septum.  The patent foramen was easily identified and using a  running 4-0 Prolene in double layer, the patent foramen was closed.  With  closure of the patent foramen,___________ maneuvers were made to get as much  air out of the atrium as possible and the closure was completed.  The right  atriotomy was then closed with horizontal running 4-0 Prolene suture.  With the cross clamp still in place, three punch  aortotomies were performed on  the ascending aorta and each of the three vein grafts were anastomosed to  the ascending aorta.  Prior to completion of the final approximation of the  anastomosis, the bulldog was removed from the mammary artery.  There was  prompt rise in myocardial septal temperature.  The head was put in the down  position and other ___________ maneuvers and the heart was allowed to  passively fill. The aortic cross clamp was removed.  The total cross clamp  time was 89 minutes.  The patient spontaneously converted to a sinus rhythm  but because of slow rate, transiently required atrial pacing.  With the body  temperature rewarmed to 37 degrees, he was then ventilated and weaned from  cardiopulmonary bypass and low dose dopamine.  He was decannulated in the  usual fashion.  Protamine sulfate was administered with the operative field  hemostatic.  Two right atrial and two ventricular pacing wires were applied.  Graft marks applied.  The left pleural tube and two mediastinal tubes were  left in place.  The sternum was closed with #6 stainless steel wire. The  fascia was closed with interrupted 0 Vicryl and running 3-0 Vicryl and the  subcutaneous tissue with 4-0 subcuticular stitch in the skin edges.  Dry  dressings were applied.  Sponge and needle count was reported as correct at  the completion of the procedure.  The patient tolerated the procedure  without obvious complication.  No blood products were administered.      Sheliah Plane, MD  Electronically Signed     EG/MEDQ  D:  10/31/2005  T:  10/31/2005  Job:  [id number]   cc:   Tobe Sos, M.D.  [place]   Nanetta Batty, M.D.  Fax: [phone]

## 2011-01-11 NOTE — Telephone Encounter (Signed)
Have referral form, Dr Gala Romney will sign on Mon and fax to them, left mess on pt's VM

## 2011-01-11 NOTE — Telephone Encounter (Signed)
Pt needs auth for cardiac rehab

## 2011-01-11 NOTE — Consult Note (Signed)
Justin [name], Justin [name] NO.:  [id number]   MEDICAL RECORD NO.:  [id number]          PATIENT TYPE:  OIB   LOCATION:  2899                         FACILITY:  [facility]   PHYSICIAN:  Sheliah Plane, MD    DATE OF BIRTH:  [birth year]   DATE OF CONSULTATION:  10/29/2005  DATE OF DISCHARGE:                                   CONSULTATION   REQUESTING PHYSICIAN:  Dr. Allyson Sabal   CARDIOLOGIST:  Dr. Shelva Majestic   PRIMARY CARE PHYSICIAN:  Dr. Clent Ridges in [place].   REASON FOR CONSULTATION:  Coronary occlusive disease.   HISTORY OF PRESENT ILLNESS:  This patient is a 77 year old male, who had a  myocardial infarction approximately 8 years ago initially recognized only by  EKG changes.  He was treated medically and had done well, though  approximately 2 years ago he began having vague onset of exertional angina.  He notes that in the fall of 2006, these symptoms began getting worse with  less activity.  Frequently he had noted substernal chest pain, nonradiating,  without shortness of breath or nausea with exertion early in the morning and  later in the day, it would be less bothersome.  He has continued to work  full time in spite of these symptoms.  In addition, he has had episodes of  gas after eating which has also been getting worse over the past several  months.  A stress echocardiogram was done in September 2006 that showed some  thickening of the aortic valve but without stenosis.  He had posterior wall  abnormality and question of peri-infarct ischemia.  He denies hypertension,  has treated hyperlipidemia, denies diabetes, was a very remote smoker, quit  more than 50 years ago.   FAMILY HISTORY:  Significant for a father who died of congestive heart  failure at age 58.  Mother died of congestive heart failure at 52.  He has 1  sister who has had a history of breast cancer but no coronary disease.  The  patient has had no previous history of stroke or TIAs.  Denies claudication,  denies renal insufficiency.   PAST SURGICAL HISTORY:  1.  Hammer toe repairs bilateral feet.  2.  Drainage of anal cyst.  3.  Repair of right thumb injury.  4.  Bilateral subcutaneous mastectomies.   SOCIAL HISTORY:  The patient is married, lives with his wife, has 3  daughters.  He has worked a Product/process development scientist, has occasional wine.  As  noted above, he quit smoking more than 50 years ago.   MEDICATIONS:  1.  Lipitor 10 mg daily.  2.  Allergra.  3.  Toprol XL 100 mg daily.  4.  Aspirin 325 mg daily.  5.  Flomax 0.4 mg daily.   DRUG ALLERGIES:  SULFA which causes redness in the face.  The patient also  has been followed for allergies to grasses and trees.   REVIEW OF SYSTEMS:  CARDIAC:  Positive for chest pain, mild lower extremity  edema.  Denies palpitations, denies syncope, denies presyncope, denies  orthopnea, denies exertional or resting shortness of breath.  GENERAL:  The  patient denies any constitutional symptoms such as fever, chills, or night  sweats.  RESPIRATORY:  Does have a history of wheezing for more than 25  years usually associated with pollens and grasses.  GASTROINTESTINAL:  Frequent gas and bloating and belching has become more frequent in the past  month.  NEUROLOGIC:  Denies TIAs or amaurosis, does have some numbness in  his toes.  MUSCULOSKELETAL:  Feet hurt with ambulation, denies claudication.  GU:  Had some urinary frequency and hesitation, started on Flomax for BPH  last year.  Denies any recent infections, denies any easy bruisability,  denies amaurosis or TIAs, denies psychiatric history.  Other review of  systems are negative.   PHYSICAL EXAMINATION:  VITAL SIGNS:  Blood pressure 131/80.  Pulse is 66,  respiratory rate 17, O2 saturations 97% on room air, 5 feet 11 inches tall,  268 pounds.  GENERAL:  The patient is awake and alert, neurologically intact and able to  relay his history.  HEENT:  Pupils equal, round, and reactive to light.   NECK:  Supple with no carotid bruits.  LUNGS:  Clear without wheezing.  CARDIAC EXAM:  2/6 early systolic murmur, best heard along the right sternal  border but also heard at the apex, is nonradiating.  ABDOMINAL EXAM:  Soft without palpable tenderness.  There is no abdominal  enlargement of the aorta that is palpable.  NEUROLOGIC EXAM:  Grossly intact.  He has no palpable cervical __________  lymph nodes.  Right groin incision was catheterized.  There is no hematoma.  Pulses are 2+ DP and PT bilaterally.  SKIN:  Without ischemic changes.   Laboratory findings reveal a white count of 8.7, hematocrit of 40,  hemoglobin 13.8, platelets 210.  PT is 14, INR 1.1, PTT 32, SGOT 13, SGPT  41, creatinine 0.8.  Cardiac catheterization films are reviewed.  The  patient has a 50% ejection fraction.  The LAD proximally had a complex  lesion of at least 90% and extended into a diagonal branch.  The circumflex  is a dominant vessel without significant stenosis.  The patient has a very  short left main.  The right coronary artery appears to be small and is  totally occluded in the proximal third with collateral filling of small  distal right.  Because of the murmur, the valve gradient was discussed with  Dr. Marina Goodell, and there was no valve gradient appreciated.   IMPRESSION:  The patient with progressive anginal symptoms and catheterized,  is found to have high-grade proximal left anterior descending complex lesion  not suitable for angioplasty with total occlusion the right coronary artery  and evidence of old inferior myocardial infarction.  Because of the critical  nature of the proximal left anterior descending involving the diagonal,  coronary artery bypass grafting is recommended to the patient.  In addition,  there is some disease in the first obtuse marginal.  I have recommended  coronary artery bypass grafting to the patient as has Dr. Allyson Sabal.  At the time of surgery, we will place a TEE probe to  further evaluate the aortic  valve and only if appeared to be unrecognized significant disease of the  aortic valve will we do anything.  We did discuss aortic valve  replacement with the patient and if necessary, he would prefer a tissue  valve.  The risks of surgery including death, infection, stroke, myocardial  infarction, bleeding, blood transfusion are all discussed with the patient  in  detail.  He is willing to proceed.  We will plan on surgery on October 30, 2005.      Sheliah Plane, MD  Electronically Signed     EG/MEDQ  D:  10/29/2005  T:  10/29/2005  Job:  [id number]   cc:   Nanetta Batty, M.D.  Fax: [phone]   Macarthur Critchley. Shelva Majestic, M.D.  Fax: [phone]

## 2011-01-11 NOTE — Op Note (Signed)
NAMEMarland Kitchen  Justin [name], Justin [name] NO.:  [id number]   MEDICAL RECORD NO.:  [id number]          PATIENT TYPE:  INP   LOCATION:  2315                         FACILITY:  [facility]   PHYSICIAN:  Zenon Mayo, MDDATE OF BIRTH:  [birth year]   DATE OF PROCEDURE:  10/30/2005  DATE OF DISCHARGE:                                 OPERATIVE REPORT   DESCRIPTION:  [name] is a 77 year old gentleman with hypertension,  coronary artery disease who was brought to the operating room today by Dr.  Tyrone Sage for coronary artery bypass surgery. Transesophageal echocardiogram  was requested to also evaluate the aortic valve which had been noted to be  calcified on catheterization. The patient was brought to the operating room  and placed under general anesthesia. After confirmation of endotracheal tube  placement and suctioning of the esophagus, a transesophageal echo probe was  placed into the esophagus without complication. The left ventricle was  imaged first and revealed normal-sized left ventricle with wall thickness  measuring 11 mm. There were no wall motion abnormalities noted and an  ejection fraction of 55% was estimated. The mitral valve appeared normal in  structure. There did not appear to be any calcifications or vegetations  seen. The leaflets appeared to coapt well. When color was placed across the  valve, a trace amount of mitral regurgitation was seen. The aortic valve was  imaged next. The aortic valve was trileaflet in nature. All three leaflets  did have a moderate amount of calcification noted. The leaflets appeared to  open well, however. The aortic valve area was measured by planimetry and  then an area of 1.65 cm2 was measured. I was unable to give velocities  across the valve due to the angle at which I was taking measurements. There  was only trace to mild amounts of aortic insufficiency seen. The pulmonic  valve was normal in structure and function. The tricuspid  valve appeared  normal and revealed trace amounts of tricuspid regurgitation. When  evaluating or looking at the interatrial septum, a patent foramen ovale was  seen with left-to-right flow. The left atrial appendage was free from  thrombus. The thoracic aorta revealed moderate atherosclerotic disease.   At the conclusion of bypass, the heart was again imaged. A dopamine infusion  had been begun to give inotropic support for the post bypass. There was no  change in left ventricular function as well as wall motion. The interatrial  septum was again visualized and I was unable to locate any blood flow coming  across the septum at this point having been repaired by Dr. Tyrone Sage. The  other structures and functions of the heart remained unchanged from  prebypass values. The patient remained stable throughout his post bypass. At  the conclusion of the procedure, the echo probe was removed from the  patient's esophagus without complication. The patient was taken from the  operating room directly to the intensive care unit in stable condition.           ______________________________  Zenon Mayo, MD     WEF/MEDQ  D:  10/30/2005  T:  10/31/2005  Job:  [id number]   cc:   Patient's chart   Anesthesia Office

## 2011-01-11 NOTE — Assessment & Plan Note (Signed)
[facility] OFFICE NOTE   NAME:[name], [name]                     MRN:          [id number]  DATE:06/30/2006                            DOB:          28-Jul-1934    This is a 77 year old gentleman here for complete physical examination,  generally is doing well and has no complaints at all.  He continues to see  Dr. Alric Seton every 3 months for followup of coronary artery disease.  He just  had full stress testing done a year ago, and this came out fine.  Patient  just got back from a 2-week cruise with his wife to [place] and enjoyed  himself thoroughly.  He continues to walk every day for exercise.  He has  been able to lose about 20 pounds of weight over the past year.  We have  been following him primarily for hypertension, coronary artery disease,  hyperlipidemia, BPH and some erectile dysfunction.  For details of his past  medical history, family history, social history, habits, etc., refer to the  last physical note dated June 27, 2005.   ALLERGIES:  SULFA, PEANUTS AND SHRIMP.   CURRENT MEDICATIONS:  1. Aspirin 325 mg per day.  2. Lipitor 5 mg per day.  3. Flonase nasal sprays daily.  4. Cialis 20 mg as needed.   OBJECTIVE:  VITAL SIGNS:  Height 68 inches, weight 256 pounds, BP 138/64,  pulse 72 and regular.  GENERAL:  He remains overweight.  SKIN:  Eyes clear.  Sclerae and pharynx clear.  NECK:  Supple without lymphadenopathy or masses.  LUNGS:  Clear.  CARDIAC:  Regular rate and rhythm, without gallop, murmurs, rubs.  Distal  pulses are full.  EKGs within normal limits.  ABDOMEN:  Soft, normal bowel sounds, nontender, no masses.  GENITALIA:  Normal male.  RECTAL:  No masses or tenderness.  Prostate smooth.  It is slightly  enlarged.  Stool:  Heme-occult negative.  EXTREMITIES:  No clubbing, cyanosis or edema.  NEUROLOGIC:  Exam is grossly intact.   ASSESSMENT/PLAN:  Problem:  1.  Complete physical.  Will check the usual laboratories today, and I      encouraged him to continue losing weight.  2. Coronary artery disease, currently stable.  He is due to see Dr.      Alric Seton again in several months.  3. Hypertension, stable, off of all medications.  4. Hyperlipidemia.  Will check a fasting lipid panel today.  5. Erectile dysfunction, stable.  6. Benign prostatic hypertrophy, stable, off of all medications.    ______________________________  Tera Mater Clent Ridges, MD    SAF/MedQ  DD: 06/30/2006  DT: 06/30/2006  Job #: [id number]

## 2011-01-18 ENCOUNTER — Encounter (HOSPITAL_COMMUNITY): Payer: Self-pay | Attending: Internal Medicine

## 2011-01-18 DIAGNOSIS — I080 Rheumatic disorders of both mitral and aortic valves: Secondary | ICD-10-CM | POA: Insufficient documentation

## 2011-01-18 DIAGNOSIS — I10 Essential (primary) hypertension: Secondary | ICD-10-CM | POA: Insufficient documentation

## 2011-01-18 DIAGNOSIS — E785 Hyperlipidemia, unspecified: Secondary | ICD-10-CM | POA: Insufficient documentation

## 2011-01-18 DIAGNOSIS — Z5189 Encounter for other specified aftercare: Secondary | ICD-10-CM | POA: Insufficient documentation

## 2011-01-18 DIAGNOSIS — E669 Obesity, unspecified: Secondary | ICD-10-CM | POA: Insufficient documentation

## 2011-01-18 DIAGNOSIS — I079 Rheumatic tricuspid valve disease, unspecified: Secondary | ICD-10-CM | POA: Insufficient documentation

## 2011-01-21 ENCOUNTER — Encounter (HOSPITAL_COMMUNITY): Payer: Self-pay

## 2011-01-23 ENCOUNTER — Encounter (HOSPITAL_COMMUNITY): Payer: Self-pay

## 2011-01-25 ENCOUNTER — Encounter (HOSPITAL_COMMUNITY): Payer: Self-pay | Attending: Internal Medicine

## 2011-01-25 DIAGNOSIS — E785 Hyperlipidemia, unspecified: Secondary | ICD-10-CM | POA: Insufficient documentation

## 2011-01-25 DIAGNOSIS — I080 Rheumatic disorders of both mitral and aortic valves: Secondary | ICD-10-CM | POA: Insufficient documentation

## 2011-01-25 DIAGNOSIS — E669 Obesity, unspecified: Secondary | ICD-10-CM | POA: Insufficient documentation

## 2011-01-25 DIAGNOSIS — I079 Rheumatic tricuspid valve disease, unspecified: Secondary | ICD-10-CM | POA: Insufficient documentation

## 2011-01-25 DIAGNOSIS — I10 Essential (primary) hypertension: Secondary | ICD-10-CM | POA: Insufficient documentation

## 2011-01-25 DIAGNOSIS — Z5189 Encounter for other specified aftercare: Secondary | ICD-10-CM | POA: Insufficient documentation

## 2011-01-28 ENCOUNTER — Encounter (HOSPITAL_COMMUNITY): Payer: Self-pay

## 2011-01-29 ENCOUNTER — Telehealth: Payer: Self-pay | Admitting: Internal Medicine

## 2011-01-29 NOTE — Telephone Encounter (Signed)
Pt rtn call to heather from yesterday °

## 2011-01-29 NOTE — Telephone Encounter (Signed)
Left a message to call back.

## 2011-01-29 NOTE — Telephone Encounter (Signed)
Echo results given. Patient verbalized understanding.

## 2011-01-30 ENCOUNTER — Encounter (HOSPITAL_COMMUNITY): Payer: Self-pay

## 2011-02-01 ENCOUNTER — Encounter (HOSPITAL_COMMUNITY): Payer: Self-pay

## 2011-02-04 ENCOUNTER — Encounter (HOSPITAL_COMMUNITY): Payer: Self-pay

## 2011-02-06 ENCOUNTER — Encounter (HOSPITAL_COMMUNITY): Payer: Self-pay

## 2011-02-08 ENCOUNTER — Encounter (HOSPITAL_COMMUNITY): Payer: Self-pay

## 2011-02-11 ENCOUNTER — Encounter (HOSPITAL_COMMUNITY): Payer: Self-pay

## 2011-02-13 ENCOUNTER — Encounter (HOSPITAL_COMMUNITY): Payer: Self-pay

## 2011-02-15 ENCOUNTER — Encounter (HOSPITAL_COMMUNITY): Payer: Self-pay

## 2011-02-18 ENCOUNTER — Encounter (HOSPITAL_COMMUNITY): Payer: Self-pay

## 2011-02-20 ENCOUNTER — Encounter (HOSPITAL_COMMUNITY): Payer: Self-pay

## 2011-02-22 ENCOUNTER — Encounter (HOSPITAL_COMMUNITY): Payer: Self-pay

## 2011-02-25 ENCOUNTER — Encounter (HOSPITAL_COMMUNITY): Payer: Self-pay | Attending: Internal Medicine

## 2011-02-25 DIAGNOSIS — I080 Rheumatic disorders of both mitral and aortic valves: Secondary | ICD-10-CM | POA: Insufficient documentation

## 2011-02-25 DIAGNOSIS — Z5189 Encounter for other specified aftercare: Secondary | ICD-10-CM | POA: Insufficient documentation

## 2011-02-25 DIAGNOSIS — E669 Obesity, unspecified: Secondary | ICD-10-CM | POA: Insufficient documentation

## 2011-02-25 DIAGNOSIS — E785 Hyperlipidemia, unspecified: Secondary | ICD-10-CM | POA: Insufficient documentation

## 2011-02-25 DIAGNOSIS — I10 Essential (primary) hypertension: Secondary | ICD-10-CM | POA: Insufficient documentation

## 2011-02-25 DIAGNOSIS — I079 Rheumatic tricuspid valve disease, unspecified: Secondary | ICD-10-CM | POA: Insufficient documentation

## 2011-02-27 ENCOUNTER — Encounter (HOSPITAL_COMMUNITY): Payer: Self-pay

## 2011-03-01 ENCOUNTER — Encounter (HOSPITAL_COMMUNITY): Payer: Self-pay

## 2011-03-04 ENCOUNTER — Encounter (HOSPITAL_COMMUNITY): Payer: Self-pay

## 2011-03-06 ENCOUNTER — Encounter (HOSPITAL_COMMUNITY): Payer: Self-pay

## 2011-03-08 ENCOUNTER — Encounter (HOSPITAL_COMMUNITY): Payer: Self-pay

## 2011-03-11 ENCOUNTER — Encounter (HOSPITAL_COMMUNITY): Payer: Self-pay

## 2011-03-13 ENCOUNTER — Encounter (HOSPITAL_COMMUNITY): Payer: Self-pay

## 2011-03-15 ENCOUNTER — Encounter (HOSPITAL_COMMUNITY): Payer: Self-pay

## 2011-03-18 ENCOUNTER — Encounter (HOSPITAL_COMMUNITY): Payer: Self-pay

## 2011-03-19 ENCOUNTER — Inpatient Hospital Stay (HOSPITAL_COMMUNITY): Admission: RE | Admit: 2011-03-19 | Payer: Medicare Other | Source: Ambulatory Visit | Admitting: Orthopedic Surgery

## 2011-03-20 ENCOUNTER — Encounter (HOSPITAL_COMMUNITY): Payer: Self-pay

## 2011-03-22 ENCOUNTER — Encounter (HOSPITAL_COMMUNITY): Payer: Self-pay

## 2011-03-25 ENCOUNTER — Encounter (HOSPITAL_COMMUNITY): Payer: Self-pay

## 2011-03-27 ENCOUNTER — Encounter (HOSPITAL_COMMUNITY): Payer: Self-pay | Attending: Internal Medicine

## 2011-03-27 DIAGNOSIS — I10 Essential (primary) hypertension: Secondary | ICD-10-CM | POA: Insufficient documentation

## 2011-03-27 DIAGNOSIS — E785 Hyperlipidemia, unspecified: Secondary | ICD-10-CM | POA: Insufficient documentation

## 2011-03-27 DIAGNOSIS — I080 Rheumatic disorders of both mitral and aortic valves: Secondary | ICD-10-CM | POA: Insufficient documentation

## 2011-03-27 DIAGNOSIS — I079 Rheumatic tricuspid valve disease, unspecified: Secondary | ICD-10-CM | POA: Insufficient documentation

## 2011-03-27 DIAGNOSIS — Z5189 Encounter for other specified aftercare: Secondary | ICD-10-CM | POA: Insufficient documentation

## 2011-03-27 DIAGNOSIS — E669 Obesity, unspecified: Secondary | ICD-10-CM | POA: Insufficient documentation

## 2011-03-29 ENCOUNTER — Encounter (HOSPITAL_COMMUNITY): Payer: Self-pay

## 2011-04-01 ENCOUNTER — Encounter (HOSPITAL_COMMUNITY): Payer: Self-pay

## 2011-04-03 ENCOUNTER — Encounter (HOSPITAL_COMMUNITY): Payer: Self-pay

## 2011-04-05 ENCOUNTER — Encounter (HOSPITAL_COMMUNITY): Payer: Self-pay

## 2011-04-08 ENCOUNTER — Encounter (HOSPITAL_COMMUNITY): Payer: Self-pay

## 2011-04-10 ENCOUNTER — Encounter (HOSPITAL_COMMUNITY): Payer: Self-pay

## 2011-04-12 ENCOUNTER — Encounter (HOSPITAL_COMMUNITY): Payer: Self-pay

## 2011-04-15 ENCOUNTER — Encounter (HOSPITAL_COMMUNITY): Payer: Self-pay

## 2011-04-17 ENCOUNTER — Encounter (HOSPITAL_COMMUNITY): Payer: Self-pay

## 2011-04-19 ENCOUNTER — Encounter (HOSPITAL_COMMUNITY): Payer: Self-pay

## 2011-04-22 ENCOUNTER — Encounter (HOSPITAL_COMMUNITY): Payer: Self-pay

## 2011-04-24 ENCOUNTER — Encounter (HOSPITAL_COMMUNITY): Payer: Self-pay

## 2011-04-26 ENCOUNTER — Encounter (HOSPITAL_COMMUNITY): Payer: Self-pay

## 2011-04-26 ENCOUNTER — Encounter (HOSPITAL_COMMUNITY)
Admission: RE | Admit: 2011-04-26 | Discharge: 2011-04-26 | Disposition: A | Discharge status: Home / Self Care | Payer: Medicare Other | Source: Ambulatory Visit | Attending: Orthopedic Surgery | Admitting: Orthopedic Surgery

## 2011-04-26 LAB — PROTIME-INR: INR: 1.04 (ref 0.00–1.49)

## 2011-04-26 LAB — SURGICAL PCR SCREEN
MRSA, PCR: NEGATIVE
Staphylococcus aureus: NEGATIVE

## 2011-04-26 LAB — URINALYSIS, ROUTINE W REFLEX MICROSCOPIC
Ketones, ur: NEGATIVE mg/dL
Leukocytes, UA: NEGATIVE
Nitrite: NEGATIVE
Protein, ur: NEGATIVE mg/dL
Urobilinogen, UA: 0.2 mg/dL (ref 0.0–1.0)
pH: 6.5 (ref 5.0–8.0)

## 2011-04-26 LAB — BASIC METABOLIC PANEL
BUN: 17 mg/dL (ref 6–23)
CO2: 27 mEq/L (ref 19–32)
Calcium: 9.3 mg/dL (ref 8.4–10.5)
Chloride: 100 mEq/L (ref 96–112)
Creatinine, Ser: 0.78 mg/dL (ref 0.50–1.35)
Glucose, Bld: 92 mg/dL (ref 70–99)

## 2011-04-26 LAB — CBC
MCH: 28.3 pg (ref 26.0–34.0)
MCHC: 33.3 g/dL (ref 30.0–36.0)
Platelets: 252 10*3/uL (ref 150–400)
RBC: 4.87 MIL/uL (ref 4.22–5.81)
RDW: 14.6 % (ref 11.5–15.5)

## 2011-04-27 LAB — URINE CULTURE
Colony Count: NO GROWTH
Culture  Setup Time: 201208311224

## 2011-04-29 ENCOUNTER — Encounter (HOSPITAL_COMMUNITY): Payer: Self-pay | Attending: Internal Medicine

## 2011-04-29 DIAGNOSIS — I079 Rheumatic tricuspid valve disease, unspecified: Secondary | ICD-10-CM | POA: Insufficient documentation

## 2011-04-29 DIAGNOSIS — E669 Obesity, unspecified: Secondary | ICD-10-CM | POA: Insufficient documentation

## 2011-04-29 DIAGNOSIS — Z5189 Encounter for other specified aftercare: Secondary | ICD-10-CM | POA: Insufficient documentation

## 2011-04-29 DIAGNOSIS — E785 Hyperlipidemia, unspecified: Secondary | ICD-10-CM | POA: Insufficient documentation

## 2011-04-29 DIAGNOSIS — I10 Essential (primary) hypertension: Secondary | ICD-10-CM | POA: Insufficient documentation

## 2011-04-29 DIAGNOSIS — I080 Rheumatic disorders of both mitral and aortic valves: Secondary | ICD-10-CM | POA: Insufficient documentation

## 2011-05-01 ENCOUNTER — Encounter (HOSPITAL_COMMUNITY): Payer: Self-pay

## 2011-05-03 ENCOUNTER — Encounter (HOSPITAL_COMMUNITY): Payer: Self-pay

## 2011-05-06 ENCOUNTER — Encounter (HOSPITAL_COMMUNITY): Payer: Self-pay

## 2011-05-07 ENCOUNTER — Inpatient Hospital Stay (HOSPITAL_COMMUNITY): Payer: Medicare Other

## 2011-05-07 ENCOUNTER — Inpatient Hospital Stay (HOSPITAL_COMMUNITY)
Admission: RE | Admit: 2011-05-07 | Discharge: 2011-05-10 | DRG: 470 | Disposition: A | Discharge status: Home / Self Care | Payer: Medicare Other | Source: Ambulatory Visit | Attending: Orthopedic Surgery | Admitting: Orthopedic Surgery

## 2011-05-07 DIAGNOSIS — I2510 Atherosclerotic heart disease of native coronary artery without angina pectoris: Secondary | ICD-10-CM | POA: Diagnosis present

## 2011-05-07 DIAGNOSIS — I10 Essential (primary) hypertension: Secondary | ICD-10-CM | POA: Diagnosis present

## 2011-05-07 DIAGNOSIS — Z882 Allergy status to sulfonamides status: Secondary | ICD-10-CM

## 2011-05-07 DIAGNOSIS — E669 Obesity, unspecified: Secondary | ICD-10-CM | POA: Diagnosis present

## 2011-05-07 DIAGNOSIS — M1710 Unilateral primary osteoarthritis, unspecified knee: Principal | ICD-10-CM | POA: Diagnosis present

## 2011-05-07 DIAGNOSIS — Z7901 Long term (current) use of anticoagulants: Secondary | ICD-10-CM

## 2011-05-07 DIAGNOSIS — Z951 Presence of aortocoronary bypass graft: Secondary | ICD-10-CM

## 2011-05-08 ENCOUNTER — Encounter (HOSPITAL_COMMUNITY): Payer: Self-pay

## 2011-05-08 LAB — BASIC METABOLIC PANEL
BUN: 9 mg/dL (ref 6–23)
CO2: 31 mEq/L (ref 19–32)
Calcium: 8.4 mg/dL (ref 8.4–10.5)
GFR calc non Af Amer: >60 mL/min (ref >60)
Glucose, Bld: 111 mg/dL — ABNORMAL HIGH (ref 70–99)
Potassium: 4 mEq/L (ref 3.5–5.1)
Sodium: 140 mEq/L (ref 135–145)

## 2011-05-08 LAB — CBC
HCT: 35.7 % — ABNORMAL LOW (ref 39.0–52.0)
MCH: 28.5 pg (ref 26.0–34.0)
MCV: 86.9 fL (ref 78.0–100.0)
Platelets: 207 10*3/uL (ref 150–400)
RDW: 15.1 % (ref 11.5–15.5)
WBC: 12.5 10*3/uL — ABNORMAL HIGH (ref 4.0–10.5)

## 2011-05-08 NOTE — Op Note (Signed)
NAMEMarland Kitchen  [name], [name] NO.:  [id number]  MEDICAL RECORD NO.:  [id number]  LOCATION:  5015                         FACILITY:  [facility]  PHYSICIAN:  Burnard Bunting, M.D.    DATE OF BIRTH:  [birth year]  DATE OF PROCEDURE:  05/07/2011 DATE OF DISCHARGE:                              OPERATIVE REPORT   PREOPERATIVE DIAGNOSIS:  Right knee arthritis.  POSTOPERATIVE DIAGNOSIS:  Right knee arthritis.  PROCEDURE:  Right total knee replacement.  SURGEON:  Burnard Bunting, M.D.  ASSISTANT:  Wende Neighbors, PA.  ANESTHESIA:  General endotracheal.  BLOOD LOSS:  100 mL.  DRAINS:  None.  TOURNIQUET TIME:  1 hour 59 minutes at 3 mmHg.  INDICATIONS:  Justin [name] is a 77 year old patient with right knee arthritis who presents for operative management of his knee arthritis after explanation of risks and benefits.  COMPONENTS INSERTED:  DePuy rotating platform, 5 femur, 5 tibia, 12.5 poly, 41 patella, posterior cruciate ligament sacrificing.  PROCEDURE IN DETAIL:  The patient was brought to operating room where general endotracheal anesthesia was induced.  Preoperative antibiotics were administered.  Right leg was prescribed with alcohol and Betadine which was allowed to air dry and prepped with DuraPrep solution and draped in sterile manner.  Justin [name] was used to cover the operative field. Right leg was elevated and exsanguinated with the Esmarch wrap. Tourniquet was inflated.  The anterior approach of the knee was made. Skin and subcutaneous tissue were sharply divided.  Median parapatellar approach was made.  Fat-pad was partially excised.  Precise location of the arthrotomy was marked #1 Vicryl suture.  Soft tissue was removed from the anterior distal femur maintaining a periosteum on the distal femur and lateral patellofemoral ligament was released.  Soft tissue dissection was performed around the proximal-medial aspect of the tibia for visualization.  ACL and PCL were  released.  Two pins were then placed percutaneously through the anterior distal medial femur and proximal medial tibia.  Registration points were achieved hip center rotation, ankle and various points around the knee.  Tibial cut was then made perpendicular mechanical axis 10 mm off of the least affected lateral side, 8.6 off of the more affected medial side.  This cut did to be revisited later in the case with an additional 2 mm resected. Tensioning device was placed in both extension and flexion.  A 12-mm cut was then made off the distal femur, spacer block was placed and the patient had full extension with a 10 spacer.  Chamfer cut and box cut was made.  Tibia was keel punched.  Trial components were again placed with the 5 femur, 4 tibia and 10 poly.  There was a 7-mm flexion gap, but the alignment was intact.  At this time soft tissue was resected in subperiosteal fashion posteriorly.  The 2 more millimeters as mentioned earlier was then resected from the tibia.  The tibia was then re-keel punched and with the trial components in place, the patient had about 5 degrees of hyperextension with the 10-mm spacer.  Patella was then cut freehand fashion from 24 to 14, 41 patella was placed.  Trial components in place.  The patient had good  patellar tracking with no-thumbs technique and in good balance, in full extension and flexion and 90 degrees of flexion with a 12.5 poly.  Trial components were removed. True components were placed.  Again the patient achieved full extension, full flexion with good patellar tracking, good stable collateral ligaments at 0, 30 and 90 degrees of flexion.  At this time, excess cement was removed.  Tourniquet was released.  Bleeding points were encountered and controlled with electrocautery.  Incision was then thoroughly irrigated and closed over Hemovac drain using #1 Vicryl suture, 0 Vicryl suture, 0 Vicryl suture and skin staples.  Incisions for the pin  sites were irrigated and closed using 3-0 nylon.  Solution of Marcaine and morphine finally injected in to the knee.  The patient tolerated procedure well without immediate complications.  Bulky dressing and knee immobilizer was placed.  Justin [name]'s assistance was required at all times during the case for retraction of important neurovascular structures, limb positioning, opening, closing.  Her assistance was a medical necessity.     Burnard Bunting, M.D.     GSD/MEDQ  D:  05/07/2011  T:  05/07/2011  Job:  [id number]  Electronically Signed by Reece Agar.  DEAN M.D. on 05/08/2011 08:37:35 AM

## 2011-05-09 LAB — BASIC METABOLIC PANEL
BUN: 10 mg/dL (ref 6–23)
Chloride: 99 mEq/L (ref 96–112)
Creatinine, Ser: 0.71 mg/dL (ref 0.50–1.35)
Glucose, Bld: 112 mg/dL — ABNORMAL HIGH (ref 70–99)
Potassium: 3.5 mEq/L (ref 3.5–5.1)

## 2011-05-09 LAB — CBC
HCT: 36.6 % — ABNORMAL LOW (ref 39.0–52.0)
Hemoglobin: 11.9 g/dL — ABNORMAL LOW (ref 13.0–17.0)
MCV: 86.7 fL (ref 78.0–100.0)
WBC: 15.6 10*3/uL — ABNORMAL HIGH (ref 4.0–10.5)

## 2011-05-09 LAB — PROTIME-INR: INR: 1.3 (ref 0.00–1.49)

## 2011-05-10 ENCOUNTER — Encounter (HOSPITAL_COMMUNITY): Payer: Self-pay

## 2011-05-10 LAB — CBC
HCT: 31.5 % — ABNORMAL LOW (ref 39.0–52.0)
Hemoglobin: 10.3 g/dL — ABNORMAL LOW (ref 13.0–17.0)
MCH: 28.1 pg (ref 26.0–34.0)
MCHC: 32.7 g/dL (ref 30.0–36.0)
MCV: 86.1 fL (ref 78.0–100.0)
RDW: 14.8 % (ref 11.5–15.5)

## 2011-05-13 ENCOUNTER — Encounter (HOSPITAL_COMMUNITY): Payer: Self-pay

## 2011-05-15 ENCOUNTER — Encounter (HOSPITAL_COMMUNITY): Payer: Self-pay

## 2011-05-17 ENCOUNTER — Encounter (HOSPITAL_COMMUNITY): Payer: Self-pay

## 2011-05-20 ENCOUNTER — Encounter (HOSPITAL_COMMUNITY): Payer: Self-pay

## 2011-05-22 ENCOUNTER — Encounter (HOSPITAL_COMMUNITY): Payer: Self-pay

## 2011-05-24 ENCOUNTER — Encounter (HOSPITAL_COMMUNITY): Payer: Self-pay

## 2011-05-27 ENCOUNTER — Encounter (HOSPITAL_COMMUNITY): Payer: Medicare Other

## 2011-05-29 ENCOUNTER — Encounter (HOSPITAL_COMMUNITY): Payer: Medicare Other

## 2011-05-31 ENCOUNTER — Encounter (HOSPITAL_COMMUNITY): Payer: Medicare Other

## 2011-06-03 ENCOUNTER — Encounter (HOSPITAL_COMMUNITY): Payer: Medicare Other

## 2011-06-05 ENCOUNTER — Encounter (HOSPITAL_COMMUNITY): Payer: Medicare Other

## 2011-06-05 NOTE — Discharge Summary (Signed)
  NAMEMarland Kitchen  WASH, NIENHAUS NO.:  [id number]  MEDICAL RECORD NO.:  [id number]  LOCATION:  5015                         FACILITY:  [facility]  PHYSICIAN:  Burnard Bunting, M.D.    DATE OF BIRTH:  [birth year]  DATE OF ADMISSION:  05/07/2011 DATE OF DISCHARGE:  05/10/2011                              DISCHARGE SUMMARY   DISCHARGE DIAGNOSIS:  Right knee arthritis.  SECONDARY DIAGNOSES:  Hypertension and coronary artery disease.  OPERATIONS AND PROCEDURES:  Right total knee replacement performed on April 27, 2011.  HOSPITAL COURSE:  Tozzi is a 77 year old patient with right knee arthritis, underwent right total knee replacement on May 07, 2011, tolerated the procedure without immediate complication, started on Coumadin for DVT prophylaxis, physical therapy for range of motion, progressed well in therapy, was walking in the hall by the time of discharge.  Postop hemoglobin 11.0.  INR 1.6 on day of discharge.Discharged home in good condition.  DISCHARGE MEDICATIONS: 1. Testosterone transdermal gel. 2. Ramipril 5 mg p.o. daily. 3. Niacin 500 mg p.o. q.a.m. 4. Ambien 5 mg p.o. nightly. 5. Enablex 15 mg p.o. q.a.m. 6. Colace 100 mg p.o. b.i.d. 7. Robaxin 500 mg p.o. q.8 h. p.r.n. spasm. 8. Lipitor 20 mg p.o. q.a.m. 9. Multivitamins 1 p.o. daily. 10.Percocet 10/325 one p.o. q.3 h. p.r.n. pain. 11.Coumadin 5 mg p.o. daily with INR 2-0.5.  He is discharged in good condition.  Follow up with me in 10 days.     Burnard Bunting, M.D.     GSD/MEDQ  D:  05/10/2011  T:  05/10/2011  Job:  [id number]  Electronically Signed by Reece Agar.  Rakeisha Nyce M.D. on 06/05/2011 02:40:58 PM

## 2011-06-07 ENCOUNTER — Encounter (HOSPITAL_COMMUNITY): Payer: Medicare Other

## 2011-06-10 ENCOUNTER — Encounter (HOSPITAL_COMMUNITY): Payer: Medicare Other

## 2011-06-12 ENCOUNTER — Encounter (HOSPITAL_COMMUNITY): Payer: Medicare Other

## 2011-06-14 ENCOUNTER — Encounter (HOSPITAL_COMMUNITY): Payer: Medicare Other

## 2011-06-17 ENCOUNTER — Encounter (HOSPITAL_COMMUNITY): Payer: Medicare Other

## 2011-06-18 ENCOUNTER — Other Ambulatory Visit: Payer: Self-pay | Admitting: *Deleted

## 2011-06-18 MED ORDER — RAMIPRIL 5 MG PO CAPS: 5.0000 mg | ORAL_CAPSULE | Freq: Every day | ORAL | Status: DC

## 2011-06-19 ENCOUNTER — Encounter (HOSPITAL_COMMUNITY): Payer: Medicare Other

## 2011-06-21 ENCOUNTER — Encounter (HOSPITAL_COMMUNITY): Payer: Medicare Other

## 2011-06-24 ENCOUNTER — Encounter (HOSPITAL_COMMUNITY): Payer: Medicare Other

## 2011-06-26 ENCOUNTER — Encounter (HOSPITAL_COMMUNITY): Payer: Medicare Other

## 2011-06-28 ENCOUNTER — Encounter (HOSPITAL_COMMUNITY): Payer: Medicare Other

## 2011-07-01 ENCOUNTER — Encounter (HOSPITAL_COMMUNITY): Payer: Medicare Other

## 2011-07-02 DIAGNOSIS — H10829 Rosacea conjunctivitis, unspecified eye: Secondary | ICD-10-CM | POA: Insufficient documentation

## 2011-07-02 DIAGNOSIS — H04229 Epiphora due to insufficient drainage, unspecified lacrimal gland: Secondary | ICD-10-CM | POA: Insufficient documentation

## 2011-07-03 ENCOUNTER — Encounter (HOSPITAL_COMMUNITY): Payer: Medicare Other

## 2011-07-05 ENCOUNTER — Encounter (HOSPITAL_COMMUNITY): Payer: Medicare Other

## 2011-07-08 ENCOUNTER — Encounter (HOSPITAL_COMMUNITY): Payer: Medicare Other

## 2011-07-10 ENCOUNTER — Encounter (HOSPITAL_COMMUNITY): Payer: Medicare Other

## 2011-07-11 ENCOUNTER — Telehealth: Payer: Self-pay | Admitting: *Deleted

## 2011-07-11 NOTE — Telephone Encounter (Signed)
Finished a Zpack yesterday for a URI, and started having chills, low grade fever, with nausea and dry heaves.  Saw Dr. Corinda Gubler yesterday and his lungs were normal.  Please call pt with suggestions.

## 2011-07-11 NOTE — Telephone Encounter (Signed)
This sounds like a viral infection, in which case antibiotics are not helpful. It needs to run its course

## 2011-07-11 NOTE — Telephone Encounter (Signed)
Left a message for pt to return call 

## 2011-07-11 NOTE — Telephone Encounter (Signed)
Patient is calling back with some dysuria, nausea, and low grade fever.  Appointment made.

## 2011-07-12 ENCOUNTER — Encounter (HOSPITAL_COMMUNITY): Payer: Medicare Other

## 2011-07-12 ENCOUNTER — Ambulatory Visit (INDEPENDENT_AMBULATORY_CARE_PROVIDER_SITE_OTHER): Payer: Medicare Other | Admitting: Family Medicine

## 2011-07-12 ENCOUNTER — Encounter: Payer: Self-pay | Admitting: Family Medicine

## 2011-07-12 VITALS — BP 136/72 | HR 85 | Temp 98.7°F | Wt 259.0 lb

## 2011-07-12 DIAGNOSIS — N390 Urinary tract infection, site not specified: Secondary | ICD-10-CM

## 2011-07-12 LAB — POCT URINALYSIS DIPSTICK
Glucose, UA: NEGATIVE
Nitrite, UA: NEGATIVE
pH, UA: 6

## 2011-07-12 MED ORDER — CIPROFLOXACIN HCL 500 MG PO TABS: 500.0000 mg | ORAL_TABLET | Freq: Two times a day (BID) | ORAL | Status: DC

## 2011-07-12 NOTE — Progress Notes (Signed)
  Subjective:    Patient ID: Justin [name], male    DOB: [birth year], 77 y.o.   MRN: [id number]  HPI Here for 3 days of fevers, body aches, urgency to urinate and burning. Some nausea. Drinking plenty of water.    Review of Systems  Constitutional: Positive for fever.  Respiratory: Negative.   Cardiovascular: Negative.   Gastrointestinal: Positive for nausea. Negative for vomiting and abdominal pain.  Genitourinary: Positive for dysuria, urgency and frequency. Negative for flank pain.       Objective:   Physical Exam  Constitutional: He appears well-developed and well-nourished.  Abdominal: Soft. Bowel sounds are normal. He exhibits no distension and no mass. There is no tenderness. There is no rebound and no guarding.          Assessment & Plan:

## 2011-07-12 NOTE — Progress Notes (Signed)
Addended by: Aniceto Boss A on: 07/12/2011 05:15 PM   Modules accepted: Orders

## 2011-07-12 NOTE — Telephone Encounter (Signed)
Pt has an appt 07/12/2011

## 2011-07-15 ENCOUNTER — Encounter (HOSPITAL_COMMUNITY): Payer: Medicare Other

## 2011-07-15 ENCOUNTER — Other Ambulatory Visit (HOSPITAL_COMMUNITY): Payer: Self-pay | Admitting: Orthopedic Surgery

## 2011-07-15 LAB — URINE CULTURE

## 2011-07-15 NOTE — Progress Notes (Signed)
Quick Note:  Pt informed on personally identified VM ______ 

## 2011-07-17 ENCOUNTER — Encounter (HOSPITAL_COMMUNITY): Payer: Medicare Other

## 2011-07-19 ENCOUNTER — Encounter (HOSPITAL_COMMUNITY): Payer: Medicare Other

## 2011-07-22 ENCOUNTER — Encounter (HOSPITAL_COMMUNITY)
Admission: RE | Admit: 2011-07-22 | Discharge: 2011-07-22 | Disposition: A | Discharge status: Home / Self Care | Payer: Medicare Other | Source: Ambulatory Visit | Attending: Orthopedic Surgery | Admitting: Orthopedic Surgery

## 2011-07-22 ENCOUNTER — Ambulatory Visit (HOSPITAL_COMMUNITY)
Admission: RE | Admit: 2011-07-22 | Discharge: 2011-07-22 | Disposition: A | Discharge status: Home / Self Care | Payer: Medicare Other | Source: Ambulatory Visit | Attending: Orthopedic Surgery | Admitting: Orthopedic Surgery

## 2011-07-22 ENCOUNTER — Encounter (HOSPITAL_COMMUNITY): Payer: Self-pay | Admitting: Pharmacy Technician

## 2011-07-22 ENCOUNTER — Encounter (HOSPITAL_COMMUNITY): Payer: Medicare Other

## 2011-07-22 DIAGNOSIS — Z01812 Encounter for preprocedural laboratory examination: Secondary | ICD-10-CM | POA: Insufficient documentation

## 2011-07-22 DIAGNOSIS — Z01818 Encounter for other preprocedural examination: Secondary | ICD-10-CM | POA: Insufficient documentation

## 2011-07-22 DIAGNOSIS — I517 Cardiomegaly: Secondary | ICD-10-CM | POA: Insufficient documentation

## 2011-07-22 LAB — CBC
Hemoglobin: 12.8 g/dL — ABNORMAL LOW (ref 13.0–17.0)
MCH: 27.9 pg (ref 26.0–34.0)
MCHC: 32.9 g/dL (ref 30.0–36.0)

## 2011-07-22 LAB — URINALYSIS, ROUTINE W REFLEX MICROSCOPIC
Glucose, UA: NEGATIVE mg/dL
Hgb urine dipstick: NEGATIVE
Protein, ur: NEGATIVE mg/dL

## 2011-07-22 LAB — BASIC METABOLIC PANEL
BUN: 22 mg/dL (ref 6–23)
Calcium: 8.9 mg/dL (ref 8.4–10.5)
Creatinine, Ser: 0.78 mg/dL (ref 0.50–1.35)
GFR calc non Af Amer: 86 mL/min — ABNORMAL LOW (ref >90)
Glucose, Bld: 99 mg/dL (ref 70–99)
Potassium: 4.3 mEq/L (ref 3.5–5.1)

## 2011-07-22 LAB — TYPE AND SCREEN: ABO/RH(D): A POS

## 2011-07-22 LAB — SURGICAL PCR SCREEN: Staphylococcus aureus: NEGATIVE

## 2011-07-22 MED ORDER — CHLORHEXIDINE GLUCONATE 4 % EX LIQD: 60.0000 mL | Freq: Once | CUTANEOUS | Status: DC

## 2011-07-22 NOTE — Pre-Procedure Instructions (Signed)
20 Justin [name]  07/22/2011   Your procedure is scheduled on:  Dec 4  Report to [facility] at 0900 AM.  Call this number if you have problems the morning of surgery: [phone]   Remember:   Do not eat food:After Midnight.  May have clear liquids: up to 4 Hours before arrival.  Clear liquids include soda, tea, black coffee, apple or grape juice, broth.  Take these medicines the morning of surgery with A SIP OF WATER: allegra, stop aspirin, fish oil   Do not wear jewelry, make-up or nail polish.  Do not wear lotions, powders, or perfumes. You may wear deodorant.  Do not shave 48 hours prior to surgery.  Do not bring valuables to the hospital.  Contacts, dentures or bridgework may not be worn into surgery.  Leave suitcase in the car. After surgery it may be brought to your room.  For patients admitted to the hospital, checkout time is 11:00 AM the day of discharge.   Patients discharged the day of surgery will not be allowed to drive home.  Name and phone number of your driver: NA  Special Instructions: CHG Shower Use Special Wash: 1/2 bottle night before surgery and 1/2 bottle morning of surgery.   Please read over the following fact sheets that you were given: Pain Booklet, Blood Transfusion Information and Surgical Site Infection Prevention

## 2011-07-23 LAB — URINE CULTURE
Colony Count: NO GROWTH
Culture: NO GROWTH

## 2011-07-23 NOTE — Consult Note (Signed)
Anesthesia:  Patient is a 77 year old male for left TKA.  He is s/p right TKA in September of this year.  Hx + for moderate AS by 12/2010 echo, CAD s/p CABG 2007, HTN, hyperlipidemia, ED, BPH, GERD.  His former Cardiologist was Dr. Shelva Majestic with [facility], but transfered care to Dr. Gala Romney in 2011.  He underwent Lexiscan stress test on 12/04/09 showing EF 56%, inferior, inferoseptal and apical defect c/w scar and possible soft tissue attenuation, but no significant ischemia.  An echocardiogram done on 01/11/11 showed LVH, EF 60-65%, normal LV wall motion, mod AS, mild AR, grade 1 diastolic dysfunction, bilateral atrium mildly dilated, and an atrial septal aneurysm was noted.  His EKG from 01/01/11 showed SR with first degree AVB, inferior infarct, and possible anterolateral infarct.  Dr. Kittie Plater recommended 1 year follow up and continued medical therapy based on these studies.  Since he has had fairly recent cardiac studies and because he tolerated a similar procedure a few months ago, would plan to proceed if he remains asymptomatic.  Labs and CXR noted.  He was treated for an E. Coli UTI on 07/12/11.  His 07/22/11 urine culture is still pending and has been left for the Short Stay nurses to f/u results.

## 2011-07-24 ENCOUNTER — Encounter (HOSPITAL_COMMUNITY): Payer: Medicare Other

## 2011-07-26 ENCOUNTER — Encounter (HOSPITAL_COMMUNITY): Payer: Medicare Other

## 2011-07-29 MED ORDER — CEFAZOLIN SODIUM-DEXTROSE 2-3 GM-% IV SOLR
2.0000 g | INTRAVENOUS | Status: DC
  Filled 2011-07-29: qty 50

## 2011-07-30 ENCOUNTER — Inpatient Hospital Stay (HOSPITAL_COMMUNITY): Payer: Medicare Other

## 2011-07-30 ENCOUNTER — Inpatient Hospital Stay (HOSPITAL_COMMUNITY): Payer: Medicare Other | Admitting: Vascular Surgery

## 2011-07-30 ENCOUNTER — Encounter (HOSPITAL_COMMUNITY): Payer: Self-pay | Admitting: Vascular Surgery

## 2011-07-30 ENCOUNTER — Encounter (HOSPITAL_COMMUNITY): Payer: Self-pay | Admitting: *Deleted

## 2011-07-30 ENCOUNTER — Encounter (HOSPITAL_COMMUNITY)
Admission: RE | Disposition: A | Discharge status: Home / Self Care | Payer: Self-pay | Source: Ambulatory Visit | Attending: Orthopedic Surgery

## 2011-07-30 ENCOUNTER — Inpatient Hospital Stay (HOSPITAL_COMMUNITY)
Admission: RE | Admit: 2011-07-30 | Discharge: 2011-08-02 | DRG: 470 | Disposition: A | Discharge status: Home / Self Care | Payer: Medicare Other | Source: Ambulatory Visit | Attending: Orthopedic Surgery | Admitting: Orthopedic Surgery

## 2011-07-30 DIAGNOSIS — I252 Old myocardial infarction: Secondary | ICD-10-CM

## 2011-07-30 DIAGNOSIS — Z860100 Personal history of colon polyps, unspecified: Secondary | ICD-10-CM

## 2011-07-30 DIAGNOSIS — I10 Essential (primary) hypertension: Secondary | ICD-10-CM | POA: Diagnosis present

## 2011-07-30 DIAGNOSIS — M1712 Unilateral primary osteoarthritis, left knee: Secondary | ICD-10-CM

## 2011-07-30 DIAGNOSIS — I2510 Atherosclerotic heart disease of native coronary artery without angina pectoris: Secondary | ICD-10-CM | POA: Diagnosis present

## 2011-07-30 DIAGNOSIS — Z951 Presence of aortocoronary bypass graft: Secondary | ICD-10-CM

## 2011-07-30 DIAGNOSIS — Z87891 Personal history of nicotine dependence: Secondary | ICD-10-CM

## 2011-07-30 DIAGNOSIS — K219 Gastro-esophageal reflux disease without esophagitis: Secondary | ICD-10-CM | POA: Diagnosis present

## 2011-07-30 DIAGNOSIS — M1710 Unilateral primary osteoarthritis, unspecified knee: Principal | ICD-10-CM | POA: Diagnosis present

## 2011-07-30 HISTORY — PX: KNEE ARTHROPLASTY: SHX992

## 2011-07-30 LAB — PROTIME-INR
INR: 1.14 (ref 0.00–1.49)
Prothrombin Time: 14.8 seconds (ref 11.6–15.2)

## 2011-07-30 SURGERY — ARTHROPLASTY, KNEE, TOTAL, USING IMAGELESS COMPUTER-ASSISTED NAVIGATION
Anesthesia: General | Site: Knee | Laterality: Left | Wound class: Clean

## 2011-07-30 MED ORDER — PROMETHAZINE HCL 25 MG/ML IJ SOLN
12.5000 mg | Freq: Four times a day (QID) | INTRAMUSCULAR | Status: DC | PRN
  Administered 2011-07-30: 12.5 mg via INTRAVENOUS
  Filled 2011-07-30: qty 1

## 2011-07-30 MED ORDER — THERA M PLUS PO TABS
1.0000 | ORAL_TABLET | Freq: Every day | ORAL | Status: DC
  Administered 2011-07-31 – 2011-08-02 (×3): 1 via ORAL
  Filled 2011-07-30 (×3): qty 1

## 2011-07-30 MED ORDER — HYDROMORPHONE HCL PF 1 MG/ML IJ SOLN
0.2500 mg | INTRAMUSCULAR | Status: DC | PRN
  Administered 2011-07-30 (×4): 0.5 mg via INTRAVENOUS

## 2011-07-30 MED ORDER — WARFARIN VIDEO
1.0000 | Freq: Once | Status: AC
  Administered 2011-07-30: 1

## 2011-07-30 MED ORDER — LORATADINE 10 MG PO TABS
10.0000 mg | ORAL_TABLET | Freq: Every day | ORAL | Status: DC
  Administered 2011-07-31 – 2011-08-02 (×3): 10 mg via ORAL
  Filled 2011-07-30 (×3): qty 1

## 2011-07-30 MED ORDER — ONE-DAILY MULTI VITAMINS PO TABS: 1.0000 | ORAL_TABLET | Freq: Every day | ORAL | Status: DC

## 2011-07-30 MED ORDER — SIMVASTATIN 20 MG PO TABS
20.0000 mg | ORAL_TABLET | ORAL | Status: DC
  Administered 2011-07-31 – 2011-08-01 (×2): 20 mg via ORAL
  Filled 2011-07-30 (×4): qty 1

## 2011-07-30 MED ORDER — CLONIDINE HCL (ANALGESIA) 100 MCG/ML EP SOLN
1000.0000 ug | EPIDURAL | Status: DC
  Filled 2011-07-30: qty 10

## 2011-07-30 MED ORDER — ONDANSETRON HCL 4 MG/2ML IJ SOLN
4.0000 mg | Freq: Four times a day (QID) | INTRAMUSCULAR | Status: DC | PRN
  Filled 2011-07-30: qty 2

## 2011-07-30 MED ORDER — NIACIN 500 MG PO TABS
500.0000 mg | ORAL_TABLET | ORAL | Status: DC
  Administered 2011-07-31 – 2011-08-02 (×3): 500 mg via ORAL
  Filled 2011-07-30 (×4): qty 1

## 2011-07-30 MED ORDER — LABETALOL HCL 5 MG/ML IV SOLN
INTRAVENOUS | Status: DC | PRN
  Administered 2011-07-30 (×4): 5 mg via INTRAVENOUS

## 2011-07-30 MED ORDER — MENTHOL 3 MG MT LOZG: 1.0000 | LOZENGE | OROMUCOSAL | Status: DC | PRN

## 2011-07-30 MED ORDER — ONDANSETRON HCL 4 MG PO TABS: 4.0000 mg | ORAL_TABLET | Freq: Four times a day (QID) | ORAL | Status: DC | PRN

## 2011-07-30 MED ORDER — CLONIDINE HCL (ANALGESIA) 100 MCG/ML EP SOLN
EPIDURAL | Status: DC | PRN
  Administered 2011-07-30: 1 mL via INTRA_ARTICULAR

## 2011-07-30 MED ORDER — DARIFENACIN HYDROBROMIDE ER 15 MG PO TB24
15.0000 mg | ORAL_TABLET | ORAL | Status: DC
  Administered 2011-07-31 – 2011-08-02 (×3): 15 mg via ORAL
  Filled 2011-07-30 (×4): qty 1

## 2011-07-30 MED ORDER — ROCURONIUM BROMIDE 100 MG/10ML IV SOLN
INTRAVENOUS | Status: DC | PRN
  Administered 2011-07-30: 50 mg via INTRAVENOUS

## 2011-07-30 MED ORDER — ONDANSETRON HCL 4 MG/2ML IJ SOLN: 4.0000 mg | Freq: Four times a day (QID) | INTRAMUSCULAR | Status: DC | PRN

## 2011-07-30 MED ORDER — TEMAZEPAM 15 MG PO CAPS
30.0000 mg | ORAL_CAPSULE | Freq: Every evening | ORAL | Status: DC | PRN
  Administered 2011-07-30: 30 mg via ORAL
  Administered 2011-07-31: 15 mg via ORAL
  Administered 2011-08-01: 30 mg via ORAL
  Filled 2011-07-30: qty 1
  Filled 2011-07-30 (×2): qty 2

## 2011-07-30 MED ORDER — OLOPATADINE HCL 0.2 % OP SOLN: 1.0000 [drp] | OPHTHALMIC | Status: DC

## 2011-07-30 MED ORDER — METOCLOPRAMIDE HCL 5 MG/ML IJ SOLN
5.0000 mg | Freq: Three times a day (TID) | INTRAMUSCULAR | Status: DC | PRN
Start: 2011-07-30 — End: 2011-08-02
  Administered 2011-07-30: 10 mg via INTRAVENOUS
  Filled 2011-07-30: qty 2

## 2011-07-30 MED ORDER — FENTANYL CITRATE 0.05 MG/ML IJ SOLN
INTRAMUSCULAR | Status: DC | PRN
  Administered 2011-07-30 (×2): 50 ug via INTRAVENOUS
  Administered 2011-07-30: 100 ug via INTRAVENOUS
  Administered 2011-07-30: 200 ug via INTRAVENOUS
  Administered 2011-07-30 (×2): 50 ug via INTRAVENOUS
  Administered 2011-07-30: 100 ug via INTRAVENOUS

## 2011-07-30 MED ORDER — OLOPATADINE HCL 0.1 % OP SOLN
1.0000 [drp] | Freq: Two times a day (BID) | OPHTHALMIC | Status: DC
  Administered 2011-08-01 – 2011-08-02 (×3): 1 [drp] via OPHTHALMIC
  Filled 2011-07-30: qty 5

## 2011-07-30 MED ORDER — LACTATED RINGERS IV SOLN
INTRAVENOUS | Status: DC | PRN
  Administered 2011-07-30 (×3): via INTRAVENOUS

## 2011-07-30 MED ORDER — ACETAMINOPHEN 325 MG PO TABS: 650.0000 mg | ORAL_TABLET | Freq: Four times a day (QID) | ORAL | Status: DC | PRN

## 2011-07-30 MED ORDER — WARFARIN SODIUM 7.5 MG PO TABS
7.5000 mg | ORAL_TABLET | Freq: Once | ORAL | Status: AC
  Administered 2011-07-30: 7.5 mg via ORAL
  Filled 2011-07-30: qty 1

## 2011-07-30 MED ORDER — ONDANSETRON HCL 4 MG/2ML IJ SOLN
INTRAMUSCULAR | Status: DC | PRN
  Administered 2011-07-30: 4 mg via INTRAVENOUS

## 2011-07-30 MED ORDER — COUMADIN BOOK
1.0000 | Freq: Once | Status: AC
  Administered 2011-07-30: 1
  Filled 2011-07-30: qty 1

## 2011-07-30 MED ORDER — POTASSIUM CHLORIDE IN NACL 20-0.9 MEQ/L-% IV SOLN
INTRAVENOUS | Status: AC
  Administered 2011-07-30: 20:00:00 via INTRAVENOUS
  Filled 2011-07-30 (×4): qty 1000

## 2011-07-30 MED ORDER — RAMIPRIL 5 MG PO CAPS
5.0000 mg | ORAL_CAPSULE | Freq: Every day | ORAL | Status: DC
  Administered 2011-07-31 – 2011-08-02 (×3): 5 mg via ORAL
  Filled 2011-07-30 (×3): qty 1

## 2011-07-30 MED ORDER — VITAMIN D3 25 MCG (1000 UNIT) PO TABS
1000.0000 [IU] | ORAL_TABLET | Freq: Every day | ORAL | Status: DC
  Administered 2011-07-31 – 2011-08-02 (×3): 1000 [IU] via ORAL
  Filled 2011-07-30 (×3): qty 1

## 2011-07-30 MED ORDER — MORPHINE SULFATE (PF) 1 MG/ML IV SOLN
INTRAVENOUS | Status: DC
  Administered 2011-07-30: 4.5 mg via INTRAVENOUS
  Administered 2011-07-30 – 2011-07-31 (×2): 1.5 mg via INTRAVENOUS

## 2011-07-30 MED ORDER — METOCLOPRAMIDE HCL 10 MG PO TABS: 5.0000 mg | ORAL_TABLET | Freq: Three times a day (TID) | ORAL | Status: DC | PRN

## 2011-07-30 MED ORDER — DIPHENHYDRAMINE HCL 50 MG/ML IJ SOLN
12.5000 mg | Freq: Four times a day (QID) | INTRAMUSCULAR | Status: DC | PRN
  Filled 2011-07-30: qty 0.25

## 2011-07-30 MED ORDER — SODIUM CHLORIDE 0.9 % IR SOLN
Status: DC | PRN
  Administered 2011-07-30: 1000 mL
  Administered 2011-07-30: 3000 mL

## 2011-07-30 MED ORDER — SODIUM CHLORIDE 0.9 % IJ SOLN: 9.0000 mL | INTRAMUSCULAR | Status: DC | PRN

## 2011-07-30 MED ORDER — CEFAZOLIN SODIUM 1-5 GM-% IV SOLN
1.0000 g | Freq: Four times a day (QID) | INTRAVENOUS | Status: AC
  Administered 2011-07-30 – 2011-07-31 (×3): 1 g via INTRAVENOUS
  Filled 2011-07-30 (×3): qty 50

## 2011-07-30 MED ORDER — NEOSTIGMINE METHYLSULFATE 1 MG/ML IJ SOLN
INTRAMUSCULAR | Status: DC | PRN
  Administered 2011-07-30: 2 mg via INTRAVENOUS

## 2011-07-30 MED ORDER — DIPHENHYDRAMINE HCL 12.5 MG/5ML PO ELIX
12.5000 mg | ORAL_SOLUTION | Freq: Four times a day (QID) | ORAL | Status: DC | PRN
  Filled 2011-07-30: qty 5

## 2011-07-30 MED ORDER — ONDANSETRON HCL 4 MG/2ML IJ SOLN
4.0000 mg | Freq: Once | INTRAMUSCULAR | Status: AC | PRN
  Administered 2011-07-30: 4 mg via INTRAVENOUS

## 2011-07-30 MED ORDER — PROPOFOL 10 MG/ML IV EMUL
INTRAVENOUS | Status: DC | PRN
  Administered 2011-07-30: 200 mg via INTRAVENOUS

## 2011-07-30 MED ORDER — PHENOL 1.4 % MT LIQD
1.0000 | OROMUCOSAL | Status: DC | PRN
  Administered 2011-07-30: 1 via OROMUCOSAL
  Filled 2011-07-30: qty 177

## 2011-07-30 MED ORDER — GLYCOPYRROLATE 0.2 MG/ML IJ SOLN
INTRAMUSCULAR | Status: DC | PRN
  Administered 2011-07-30: 0.2 mg via INTRAVENOUS

## 2011-07-30 MED ORDER — VITAMIN D 1000 UNITS PO CAPS: 1000.0000 [IU] | ORAL_CAPSULE | Freq: Every day | ORAL | Status: DC

## 2011-07-30 MED ORDER — NALOXONE HCL 0.4 MG/ML IJ SOLN
0.4000 mg | INTRAMUSCULAR | Status: DC | PRN
  Filled 2011-07-30: qty 1

## 2011-07-30 MED ORDER — LACTATED RINGERS IV SOLN
INTRAVENOUS | Status: DC
  Administered 2011-07-30: 10:00:00 via INTRAVENOUS

## 2011-07-30 MED ORDER — ACETAMINOPHEN 650 MG RE SUPP: 650.0000 mg | Freq: Four times a day (QID) | RECTAL | Status: DC | PRN

## 2011-07-30 SURGICAL SUPPLY — 77 items
BANDAGE ACE 4 STERILE (GAUZE/BANDAGES/DRESSINGS) ×2 IMPLANT
BANDAGE ELASTIC 4 VELCRO ST LF (GAUZE/BANDAGES/DRESSINGS) IMPLANT
BANDAGE ELASTIC 6 VELCRO ST LF (GAUZE/BANDAGES/DRESSINGS) ×2 IMPLANT
BANDAGE ESMARK 6X9 LF (GAUZE/BANDAGES/DRESSINGS) ×1 IMPLANT
BLADE SAG 18X100X1.27 (BLADE) ×2 IMPLANT
BLADE SAW SGTL 13.0X1.19X90.0M (BLADE) ×2 IMPLANT
BLADE SURG 10 STRL SS (BLADE) ×4 IMPLANT
BNDG COHESIVE 6X5 TAN STRL LF (GAUZE/BANDAGES/DRESSINGS) ×2 IMPLANT
BNDG ELASTIC 6X10 VLCR STRL LF (GAUZE/BANDAGES/DRESSINGS) IMPLANT
BNDG ESMARK 6X9 LF (GAUZE/BANDAGES/DRESSINGS) ×2
BOWL SMART MIX CTS (DISPOSABLE) ×2 IMPLANT
CEMENT HV SMART SET (Cement) ×4 IMPLANT
CLOTH BEACON ORANGE TIMEOUT ST (SAFETY) ×2 IMPLANT
COVER BACK TABLE 24X17X13 BIG (DRAPES) IMPLANT
COVER SURGICAL LIGHT HANDLE (MISCELLANEOUS) ×2 IMPLANT
CUFF TOURNIQUET SINGLE 34IN LL (TOURNIQUET CUFF) ×2 IMPLANT
CUFF TOURNIQUET SINGLE 44IN (TOURNIQUET CUFF) IMPLANT
DRAPE INCISE IOBAN 66X45 STRL (DRAPES) ×4 IMPLANT
DRAPE ORTHO SPLIT 77X108 STRL (DRAPES) ×2
DRAPE PROXIMA HALF (DRAPES) ×2 IMPLANT
DRAPE SURG ORHT 6 SPLT 77X108 (DRAPES) ×2 IMPLANT
DRAPE U-SHAPE 47X51 STRL (DRAPES) ×2 IMPLANT
DRAPE X-RAY CASS 24X20 (DRAPES) IMPLANT
DRSG PAD ABDOMINAL 8X10 ST (GAUZE/BANDAGES/DRESSINGS) IMPLANT
DURAPREP 26ML APPLICATOR (WOUND CARE) ×2 IMPLANT
ELECT REM PT RETURN 9FT ADLT (ELECTROSURGICAL) ×2
ELECTRODE REM PT RTRN 9FT ADLT (ELECTROSURGICAL) ×1 IMPLANT
EVACUATOR 1/8 PVC DRAIN (DRAIN) IMPLANT
FACESHIELD LNG OPTICON STERILE (SAFETY) ×2 IMPLANT
FLUID NSS /IRRIG 3000 ML XXX (IV SOLUTION) ×2 IMPLANT
GAUZE XEROFORM 5X9 LF (GAUZE/BANDAGES/DRESSINGS) IMPLANT
GLOVE BIO SURGEON ST LM GN SZ9 (GLOVE) IMPLANT
GLOVE BIOGEL PI IND STRL 8 (GLOVE) ×1 IMPLANT
GLOVE BIOGEL PI INDICATOR 8 (GLOVE) ×1
GLOVE SURG ORTHO 8.0 STRL STRW (GLOVE) ×2 IMPLANT
GOWN PREVENTION PLUS LG XLONG (DISPOSABLE) ×2 IMPLANT
GOWN PREVENTION PLUS XLARGE (GOWN DISPOSABLE) ×4 IMPLANT
GOWN STRL NON-REIN LRG LVL3 (GOWN DISPOSABLE) ×4 IMPLANT
HANDPIECE INTERPULSE COAX TIP (DISPOSABLE) ×1
HOOD PEEL AWAY FACE SHEILD DIS (HOOD) ×6 IMPLANT
IMMBOLIZER KNEE 19 BLUE UNIV (SOFTGOODS) ×2 IMPLANT
IMMOBILIZER KNEE 20 (SOFTGOODS)
IMMOBILIZER KNEE 20 THIGH 36 (SOFTGOODS) IMPLANT
IMMOBILIZER KNEE 22 UNIV (SOFTGOODS) ×2 IMPLANT
IMMOBILIZER KNEE 24 THIGH 36 (MISCELLANEOUS) IMPLANT
IMMOBILIZER KNEE 24 UNIV (MISCELLANEOUS)
KIT BASIN OR (CUSTOM PROCEDURE TRAY) ×2 IMPLANT
KIT ROOM TURNOVER OR (KITS) ×2 IMPLANT
MANIFOLD NEPTUNE II (INSTRUMENTS) ×2 IMPLANT
MARKER SPHERE PSV REFLC THRD 5 (MARKER) ×6 IMPLANT
NEEDLE 18GX1X1/2 (RX/OR ONLY) (NEEDLE) ×2 IMPLANT
NEEDLE SPNL 18GX3.5 QUINCKE PK (NEEDLE) ×2 IMPLANT
NS IRRIG 1000ML POUR BTL (IV SOLUTION) ×2 IMPLANT
PACK TOTAL JOINT (CUSTOM PROCEDURE TRAY) ×2 IMPLANT
PAD ARMBOARD 7.5X6 YLW CONV (MISCELLANEOUS) ×4 IMPLANT
PAD CAST 4YDX4 CTTN HI CHSV (CAST SUPPLIES) IMPLANT
PADDING CAST COTTON 4X4 STRL (CAST SUPPLIES)
PADDING CAST COTTON 6X4 STRL (CAST SUPPLIES) ×2 IMPLANT
PIN SCHANZ 4MM 130MM (PIN) ×8 IMPLANT
RUBBERBAND STERILE (MISCELLANEOUS) IMPLANT
SET HNDPC FAN SPRY TIP SCT (DISPOSABLE) ×1 IMPLANT
SPONGE GAUZE 4X4 12PLY (GAUZE/BANDAGES/DRESSINGS) IMPLANT
SPONGE LAP 18X18 X RAY DECT (DISPOSABLE) IMPLANT
STAPLER VISISTAT 35W (STAPLE) ×2 IMPLANT
SUCTION FRAZIER TIP 10 FR DISP (SUCTIONS) ×2 IMPLANT
SUT ETHILON 3 0 PS 1 (SUTURE) ×4 IMPLANT
SUT VIC AB 0 CTB1 27 (SUTURE) ×6 IMPLANT
SUT VIC AB 1 CT1 27 (SUTURE) ×5
SUT VIC AB 1 CT1 27XBRD ANBCTR (SUTURE) ×5 IMPLANT
SUT VIC AB 2-0 CT1 27 (SUTURE) ×2
SUT VIC AB 2-0 CT1 TAPERPNT 27 (SUTURE) ×2 IMPLANT
SYR 30ML SLIP (SYRINGE) ×2 IMPLANT
SYR TB 1ML LUER SLIP (SYRINGE) ×2 IMPLANT
TOWEL OR 17X24 6PK STRL BLUE (TOWEL DISPOSABLE) ×2 IMPLANT
TOWEL OR 17X26 10 PK STRL BLUE (TOWEL DISPOSABLE) ×4 IMPLANT
TRAY FOLEY CATH 14FR (SET/KITS/TRAYS/PACK) ×2 IMPLANT
WATER STERILE IRR 1000ML POUR (IV SOLUTION) ×6 IMPLANT

## 2011-07-30 NOTE — H&P (Signed)
Justin [name] is an 77 y.o. male.   Chief Complaint: Left knee pain HPI: Justin [name] is a 77 year old patient with left knee pain. He is failed conservative management including a long course of therapy quite strengthening multiple injections bracing. He reports night pain and rest pain which is affecting his activities of daily living. He has had right total knee replacement 3 months ago has had a very good result from that procedure. He desires operative intervention for his left knee arthritis.  Past Medical History  Diagnosis Date  . Allergic     (sees Dr. Corinda Gubler)  . Hyperlipidemia   . CAD (coronary artery disease)   . Myocardial infarction   . S/P CABG (coronary artery bypass graft) 2007     Coronary artery bypass grafting x4 with left internal(Justin Tyrone Sage, MD)  . Carotid bruit     carotid u/s 10/10: 0.39% bilaterally  . Hx of colonic polyps     (sees Dr. Marina [name])  . ED (erectile dysfunction)   . HTN (hypertension)   . Precancerous skin lesion     (sees Dr. Terri [name])  . Osteoarthritis     especially in left knee, (sees Dr. Dorene Grebe )  . Benign prostatic hypertrophy     (sees Dr. Chester [name]  . Obesity   . Gout   . GERD (gastroesophageal reflux disease)     Past Surgical History  Procedure Date  . Coronary artery bypass graft 2007  . Colonoscopy 06/30/2008    Repeat  in 5 years.  . Cataract extraction, bilateral   . Cardiac catheterization 10/29/2005  . Hammer toe repairs bilaterally   . Repair of right thumb injury   . Bilateral subcutaneous mastectomies     No family history on file. Social History:  reports that he has never smoked. He has never used smokeless tobacco. He reports that he drinks alcohol. He reports that he does not use illicit drugs.  Allergies:  Allergies  Allergen Reactions  . Peanut-Containing Drug Products   . Prednisone     Constipation & "he could not sleep"  . Sulfonamide Derivatives     REACTION: anaphylaxis    Medications  Prior to Admission  Medication Dose Route Frequency Provider Last Rate Last Dose  . ceFAZolin (ANCEF) IVPB 2 g/50 mL premix  2 g Intravenous 60 min Pre-Op Cammy Copa       Medications Prior to Admission  Medication Sig Dispense Refill  . aspirin 325 MG tablet Take 325 mg by mouth daily.        [name] atorvastatin (LIPITOR) 20 MG tablet Take 20 mg by mouth daily.        . Cholecalciferol (VITAMIN D) 1000 UNITS capsule Take 1,000 Units by mouth daily.        . fexofenadine (ALLEGRA) 180 MG tablet Take 180 mg by mouth 2 (two) times daily.       . Multiple Vitamin (MULTIVITAMIN) tablet Take 1 tablet by mouth daily.        . niacin 500 MG tablet Take 500 mg by mouth every morning.       . Omega-3 Fatty Acids (FISH OIL PO) Take 1,000 mg by mouth daily. On hold for procedure      . ramipril (ALTACE) 5 MG capsule Take 1 capsule (5 mg total) by mouth daily.  90 capsule  2  . tadalafil (CIALIS) 5 MG tablet Take 5 mg by mouth as needed. For erectile dysfunction      . temazepam (  RESTORIL) 30 MG capsule Take 30 mg by mouth at bedtime as needed. For sleep      . Testosterone (ANDROGEL PUMP TD) Place 10 g onto the skin daily.         No results found for this or any previous visit (from the past 48 hour(s)). No results found.  Review of Systems  Constitutional: Negative.   HENT: Negative.   Eyes: Negative.   Respiratory: Negative.   Cardiovascular: Negative.   Genitourinary: Negative.   Musculoskeletal: Positive for joint pain.  Skin: Negative.   Neurological: Negative.   Endo/Heme/Allergies: Negative.   Psychiatric/Behavioral: Negative.     There were no vitals taken for this visit. Physical Exam  Constitutional: He is oriented to person, place, and time. He appears well-developed and well-nourished.  HENT:  Head: Normocephalic and atraumatic.  Right Ear: External ear normal.  Left Ear: External ear normal.  Nose: Nose normal.  Mouth/Throat: Oropharynx is clear and moist.  Eyes:  Conjunctivae and EOM are normal. Pupils are equal, round, and reactive to light.  Neck: Normal range of motion. Neck supple.  Cardiovascular: Normal rate, regular rhythm and intact distal pulses.   Murmur heard. Respiratory: Effort normal.  GI: Soft.  Neurological: He is alert and oriented to person, place, and time. He has normal reflexes.  Skin: Skin is warm.  Psychiatric: He has a normal mood and affect. His behavior is normal. Thought content normal.   left knee is examined. The patient has range of motion from 5-100.0 115. Varus alignment is present pedal pulses are palpable trace pitting edema is present extensor mechanism is intact there is no point in internal/external rotation of the leg no nerve root tension signs- skin is intact over the knee region  Assessment/Plan Left knee arthritis in a patient who has failed conservative measures. Patient presents now for left total knee arthroplasty he done well from his right total knee arthroplasty. The risks and benefits including but not limited to infection nerve vessel damage knee stiffness persistent pain deep vein thrombosis and death were all reviewed with the patient. Patient understands the risks and benefits and wishes to proceed with surgery. Visipaque 90% chance of improvement in pain and improvement in walking endurance activities of daily living. All questions are answered.  JustinGREGORY [name] 07/30/2011, 7:33 AM

## 2011-07-30 NOTE — Preoperative (Signed)
Beta Blockers   Reason not to administer Beta Blockers:Not Applicable 

## 2011-07-30 NOTE — Anesthesia Preprocedure Evaluation (Signed)
Anesthesia Evaluation  Patient identified by MRN, date of birth, ID band Patient awake    Reviewed: Allergy & Precautions, H&P , NPO status , Patient's Chart, lab work & pertinent test results  Airway Mallampati: I TM Distance: >3 FB Neck ROM: full    Dental   Pulmonary neg pulmonary ROS, former smoker         Cardiovascular hypertension, + CAD and + Past MI + Valvular Problems/Murmurs regular Normal    Neuro/Psych PSYCHIATRIC DISORDERS Negative Neurological ROS     GI/Hepatic GERD-  ,  Endo/Other  Negative Endocrine ROS  Renal/GU negative Renal ROS  Genitourinary negative   Musculoskeletal   Abdominal   Peds  Hematology negative hematology ROS (+)   Anesthesia Other Findings   Reproductive/Obstetrics                           Anesthesia Physical Anesthesia Plan  ASA: III  Anesthesia Plan: General   Post-op Pain Management:    Induction: Intravenous  Airway Management Planned: Oral ETT  Additional Equipment:   Intra-op Plan:   Post-operative Plan:   Informed Consent: I have reviewed the patients History and Physical, chart, labs and discussed the procedure including the risks, benefits and alternatives for the proposed anesthesia with the patient or authorized representative who has indicated his/her understanding and acceptance.     Plan Discussed with: Anesthesiologist, CRNA and Surgeon  Anesthesia Plan Comments:         Anesthesia Quick Evaluation

## 2011-07-30 NOTE — Brief Op Note (Signed)
07/30/2011  2:54 PM  PATIENT:  Justin [name]  77 y.o. male  PRE-OPERATIVE DIAGNOSIS:  Left knee osteoarthritis  POST-OPERATIVE DIAGNOSIS:  left knee osteoarthritis  PROCEDURE:  Procedure(s): COMPUTER ASSISTED TOTAL KNEE ARTHROPLASTY  SURGEON:  Surgeon(s): Corrie Mckusick Dean  ASSISTANT: Orma Flaming rnfa  ANESTHESIA:   general  EBL: 100 ml    Total I/O In: 2500 [I.V.:2500] Out: 300 [Urine:200; Blood:100]  BLOOD ADMINISTERED: none  DRAINS: hemovac x 1    LOCAL MEDICATIONS USED:  none  SPECIMEN:  No Specimen  COUNTS:  YES  TOURNIQUET:   Total Tourniquet Time Documented: Thigh (Left) - 116 minutes  DICTATION: .Other Dictation: Dictation Number [id number]  PLAN OF CARE: Admit to inpatient   PATIENT DISPOSITION:  PACU - hemodynamically stable

## 2011-07-30 NOTE — Transfer of Care (Signed)
Immediate Anesthesia Transfer of Care Note  Patient: Justin [name]  Procedure(s) Performed:  COMPUTER ASSISTED TOTAL KNEE ARTHROPLASTY - left total knee arthroplasty  Patient Location: PACU  Anesthesia Type: General  Level of Consciousness: awake and alert   Airway & Oxygen Therapy: Patient Spontanous Breathing and Patient connected to face mask oxygen  Post-op Assessment: Report given to PACU RN and Post -op Vital signs reviewed and stable  Post vital signs: Reviewed and stable  Complications: No apparent anesthesia complications

## 2011-07-30 NOTE — Progress Notes (Signed)
PHARMACY - ANTICOAGULATION CONSULT INITIAL NOTE  Pharmacy Consult for: Coumadin  Indication: VTE prophylaxis s/p left total knee arthroplasty    Patient Data:   Allergies: Allergies  Allergen Reactions  . Peanut-Containing Drug Products   . Prednisone     Constipation & "he could not sleep"  . Sulfonamide Derivatives     REACTION: anaphylaxis    Patient Measurements:   Adjusted Body Weight:   Vital Signs: Temp:  [97.5 F (36.4 C)-98 F (36.7 C)] 97.5 F (36.4 C) (12/04 1658) Pulse Rate:  [57-85] 74  (12/04 1659) Resp:  [5-24] 17  (12/04 1659) BP: (108-163)/(61-79) 156/78 mmHg (12/04 1647) SpO2:  [93 %-100 %] 99 % (12/04 1659)  Intake/Output from previous day:  Intake/Output Summary (Last 24 hours) at 07/30/11 1843 Last data filed at 07/30/11 1656  Gross per 24 hour  Intake   2500 ml  Output    550 ml  Net   1950 ml    Labs:  Basename 07/30/11 1804  HGB --  HCT --  PLT --  APTT --  LABPROT 14.8  INR 1.14  HEPARINUNFRC --  CREATININE --  CKTOTAL --  CKMB --  TROPONINI --   The CrCl is unknown because both a height and weight (above a minimum accepted value) are required for this calculation.  Medical History: Past Medical History  Diagnosis Date  . Allergic     (sees Dr. Corinda Gubler)  . Hyperlipidemia   . CAD (coronary artery disease)   . Myocardial infarction   . S/P CABG (coronary artery bypass graft) 2007     Coronary artery bypass grafting x4 with left internal(Edward Tyrone Sage, MD)  . Carotid bruit     carotid u/s 10/10: 0.39% bilaterally  . Hx of colonic polyps     (sees Dr. Marina Goodell)  . ED (erectile dysfunction)   . HTN (hypertension)   . Precancerous skin lesion     (sees Dr. Terri Piedra)  . Osteoarthritis     especially in left knee, (sees Dr. Dorene Grebe )  . Benign prostatic hypertrophy     (sees Dr. Chester Holstein  . Obesity   . Gout   . GERD (gastroesophageal reflux disease)     Scheduled medications:     . ceFAZolin (ANCEF) IV  1 g  Intravenous Q6H  . cholecalciferol  1,000 Units Oral Daily  . darifenacin  15 mg Oral Q0700  . loratadine  10 mg Oral Daily  . morphine   Intravenous Q4H  . multivitamins ther. w/minerals  1 tablet Oral Daily  . niacin  500 mg Oral Q0700  . olopatadine  1 drop Both Eyes BID  . ramipril  5 mg Oral Daily  . simvastatin  20 mg Oral Q24H  . DISCONTD: ceFAZolin (ANCEF) IV  2 g Intravenous 60 min Pre-Op  . DISCONTD: cloNIDine  1,000 mcg Intra-articular STAT  . DISCONTD: multivitamin  1 tablet Oral Daily  . DISCONTD: Olopatadine HCl  1 drop Both Eyes Q0700  . DISCONTD: Vitamin D  1,000 Units Oral Daily     Assessment:  77 y.o. male admitted on 07/30/2011 s/p left total knee arthroplasty. Pharmacy consulted to manage Coumadin.  Goal of Therapy:  1. INR 2-3  Plan:  1. Coumadin 7.5 mg po today.  2. Daily PT / INR 3. Coumadin book / video.  Dineen Kid Thad Ranger, PharmD 07/30/2011, 6:43 PM

## 2011-07-30 NOTE — Anesthesia Postprocedure Evaluation (Signed)
  Anesthesia Post-op Note  Patient: Justin [name]  Procedure(s) Performed:  COMPUTER ASSISTED TOTAL KNEE ARTHROPLASTY - left total knee arthroplasty  Patient Location: PACU  Anesthesia Type: General  Level of Consciousness: awake, alert , oriented and patient cooperative  Airway and Oxygen Therapy: Patient Spontanous Breathing and Patient connected to nasal cannula oxygen  Post-op Pain: moderate  Post-op Assessment: Post-op Vital signs reviewed, Patient's Cardiovascular Status Stable, Respiratory Function Stable, Patent Airway, No signs of Nausea or vomiting and Pain level controlled  Post-op Vital Signs: stable  Complications: No apparent anesthesia complications

## 2011-07-31 LAB — PROTIME-INR: Prothrombin Time: 13.9 seconds (ref 11.6–15.2)

## 2011-07-31 LAB — CBC
Hemoglobin: 11.6 g/dL — ABNORMAL LOW (ref 13.0–17.0)
MCV: 86.2 fL (ref 78.0–100.0)
Platelets: 327 10*3/uL (ref 150–400)
RBC: 4.27 MIL/uL (ref 4.22–5.81)
WBC: 12.2 10*3/uL — ABNORMAL HIGH (ref 4.0–10.5)

## 2011-07-31 LAB — BASIC METABOLIC PANEL
CO2: 26 mEq/L (ref 19–32)
Chloride: 101 mEq/L (ref 96–112)
Glucose, Bld: 104 mg/dL — ABNORMAL HIGH (ref 70–99)
Sodium: 137 mEq/L (ref 135–145)

## 2011-07-31 MED ORDER — OXYCODONE-ACETAMINOPHEN 5-325 MG PO TABS
1.0000 | ORAL_TABLET | ORAL | Status: DC | PRN
  Administered 2011-07-31 – 2011-08-02 (×8): 2 via ORAL
  Filled 2011-07-31 (×8): qty 2

## 2011-07-31 MED ORDER — WARFARIN SODIUM 7.5 MG PO TABS
7.5000 mg | ORAL_TABLET | Freq: Once | ORAL | Status: AC
  Administered 2011-07-31: 7.5 mg via ORAL
  Filled 2011-07-31: qty 1

## 2011-07-31 MED ORDER — METHOCARBAMOL 500 MG PO TABS
500.0000 mg | ORAL_TABLET | Freq: Four times a day (QID) | ORAL | Status: DC | PRN
  Administered 2011-07-31 – 2011-08-02 (×7): 500 mg via ORAL
  Filled 2011-07-31 (×8): qty 1

## 2011-07-31 NOTE — Op Note (Signed)
NAMEMarland Kitchen  Justin, [name] NO.:  [id number]  MEDICAL RECORD NO.:  [id number]  LOCATION:  5009                         FACILITY:  [facility]  PHYSICIAN:  Burnard Bunting, M.D.    DATE OF BIRTH:  11-27-1933  DATE OF PROCEDURE:  07/30/2011 DATE OF DISCHARGE:                              OPERATIVE REPORT   PREOPERATIVE DIAGNOSIS:  Left knee arthritis.  POSTOPERATIVE DIAGNOSIS:  Left knee arthritis.  PROCEDURE:  Left total knee replacement using DePuy posterior cruciate sacrificing, size 5 femur, size 4 tibia, 15 poly, 41 mm patella.  SURGEON:  Burnard Bunting, MD  ASSISTANT:  Marshia Ly, PA  ANESTHESIA:  General endotracheal.  ESTIMATED BLOOD LOSS:  100 mL.  DRAINS:  Hemovac x1.  TOURNIQUET TIME:  115 minutes at 300 mmHg.  INDICATIONS:  Justin [name] is a patient with left knee arthritis refractory to nonoperative management, presents for total knee replacement after explanation of risks and benefits.  PROCEDURE IN DETAIL:  The patient was brought to the operating room, where general endotracheal anesthesia was induced.  Preop antibiotics administered.  Left leg was pre-scrubbed with alcohol and Betadine, which allowed to air dry.  Prepped with DuraPrep solution, draped in a sterile manner.  [name] was used to cover the operative field.  Time-out was called.  Left leg was then elevated and exsanguinated with Esmarch wrap.  Tourniquet was inflated.  Anterior approach of the knee was utilized.  Skin and subcutaneous tissue sharply divided.  Median parapatellar approach was made. Lateral patellofemoral ligament was released.  Fat-pad was partially excised.  Osteophytes were removed.  ACL and PCL were released. Periosteal sleeve on the medial side was elevated.  Two pins were placed in the distal medial femur.  Two pins were placed in the proximal medial tibia.  Computer registration points were achieved around the hip, ankle, and points about the knee.  The tibia was then  cut perpendicular to mechanical axis.  Required a 12 mm commensurate with the previous cut on his previous side.  A tensioner was placed in both extension and flexion.  The femur was then cut approximately 8-10 mm.  The patient had a generally symmetric extension and flexion tested by blocks.  Chamfer and box cut was then made.  The tibia was then keel punched,  tried 4 tibia trial, size 5 femur, 12.5 poly and 15 poly were then trialed; 15 poly gave full extension, full flexion, excellent patellar tracking, and good stability in varus and valgus stress at 0, 30 and 90 degrees.  The patella was then prepared freehand from 25-24 mm 41 patella was placed. With trial components in position, again the patient had excellent extension and flexion, good stability in varus and valgus stress at 0, 30 and 90 degrees.  Excellent patellar tracking using no thumbs technique.  The trial components were removed.  True components were placed after a thorough 3 L irrigation.  The excess cement was removed. Same stability and range of motion parameters were maintained. Tourniquet was released.  Bleeding points were controlled with electrocautery.  The incision was closed over drain using #1 Vicryl suture, 0 Vicryl suture, 2-0 Vicryl suture, and the skin staples.  The  incisions for the pins were irrigated and closed using 3-0 nylon. Solution of Marcaine, morphine, and clonidine was injected into the knee.  The patient tolerated the procedure well without immediate complications.  Bethune's assistance was required in all times during the case for retraction, for neurovascular structures opening, closing.  Her assistance was a medical necessity.  The patient was transferred to recovery room after bulky wrap and knee immobilizer.     Burnard Bunting, M.D.     GSD/MEDQ  D:  07/30/2011  T:  07/31/2011  Job:  [id number]

## 2011-07-31 NOTE — Progress Notes (Signed)
Subjective: Pt stable has been walking in hall   Objective: Vital signs in last 24 hours: Temp:  [97.3 F (36.3 C)-98.7 F (37.1 C)] 97.9 F (36.6 C) (12/05 0557) Pulse Rate:  [57-85] 81  (12/05 0557) Resp:  [5-24] 20  (12/05 0557) BP: (108-163)/(53-79) 123/61 mmHg (12/05 0557) SpO2:  [93 %-100 %] 99 % (12/05 0557) Weight:  [116.6 kg (257 lb 0.9 oz)] 257 lb 0.9 oz (116.6 kg) (12/04 1700)  Intake/Output from previous day: 12/04 0701 - 12/05 0700 In: 2770 [P.O.:120; I.V.:2650] Out: 2225 [Urine:1850; Drains:275; Blood:100] Intake/Output this shift: Total I/O In: -  Out: 500 [Urine:500]  Exam:  Sensation intact distally Intact pulses distally Dorsiflexion/Plantar flexion intact  Labs:  Basename 07/31/11 0540  HGB 11.6*    Basename 07/31/11 0540  WBC 12.2*  RBC 4.27  HCT 36.8*  PLT 327    Basename 07/31/11 0540  NA 137  K 4.4  CL 101  CO2 26  BUN 11  CREATININE 0.73  GLUCOSE 104*  CALCIUM 8.4    Basename 07/31/11 0540 07/30/11 1804  LABPT -- --  INR 1.05 1.14    Assessment/Plan: Pt doing well - change to po pain meds - cpm today   DEAN,GREGORY SCOTT 07/31/2011, 12:48 PM

## 2011-07-31 NOTE — Progress Notes (Signed)
ANTICOAGULATION CONSULT NOTE - Follow Up Consult  Pharmacy Consult for Coumadin Indication: VTE prophylaxis  Allergies  Allergen Reactions  . Peanut-Containing Drug Products   . Prednisone     Constipation & "he could not sleep"  . Sulfonamide Derivatives     REACTION: anaphylaxis    Patient Measurements: Height: 5\' 10"  (177.8 cm) Weight: 257 lb 0.9 oz (116.6 kg) IBW/kg (Calculated) : 73  Adjusted Body Weight:    Vital Signs: Temp: 97.9 F (36.6 C) (12/05 0557) Temp src: Oral (12/05 0557) BP: 123/61 mmHg (12/05 0557) Pulse Rate: 81  (12/05 0557)  Labs:  Basename 07/31/11 0540 07/30/11 1804  HGB 11.6* --  HCT 36.8* --  PLT 327 --  APTT -- --  LABPROT 13.9 14.8  INR 1.05 1.14  HEPARINUNFRC -- --  CREATININE 0.73 --  CKTOTAL -- --  CKMB -- --  TROPONINI -- --   Estimated Creatinine Clearance: 102 ml/min (by C-G formula based on Cr of 0.73).   Medications:  Scheduled:    . ceFAZolin (ANCEF) IV  1 g Intravenous Q6H  . cholecalciferol  1,000 Units Oral Daily  . coumadin book  1 each Does not apply Once  . darifenacin  15 mg Oral Q0700  . loratadine  10 mg Oral Daily  . multivitamins ther. w/minerals  1 tablet Oral Daily  . niacin  500 mg Oral Q0700  . olopatadine  1 drop Both Eyes BID  . ramipril  5 mg Oral Daily  . simvastatin  20 mg Oral Q24H  . warfarin  7.5 mg Oral Once  . warfarin  1 each Does not apply Once  . DISCONTD: ceFAZolin (ANCEF) IV  2 g Intravenous 60 min Pre-Op  . DISCONTD: cloNIDine  1,000 mcg Intra-articular STAT  . DISCONTD: morphine   Intravenous Q4H  . DISCONTD: multivitamin  1 tablet Oral Daily  . DISCONTD: Olopatadine HCl  1 drop Both Eyes Q0700  . DISCONTD: Vitamin D  1,000 Units Oral Daily   Infusions:    . 0.9 % NaCl with KCl 20 mEq / L 75 mL/hr at 07/30/11 2010  . DISCONTD: lactated ringers 50 mL/hr at 07/30/11 1005    Assessment: S/p Left TKA start Coumadin for VTE prophylaxis. Post-op Hgb 11.6 today.   Goal of  Therapy:  INR 2-3   Plan:  Repeat Coumadin 7.5mg  again today.  Merilynn Finland, Saxon Barich Stillinger 07/31/2011,9:08 AM

## 2011-07-31 NOTE — Progress Notes (Signed)
Physical Therapy Evaluation Patient Details Name: Justin [name] MRN: [id number] DOB: June 21, 1934 Today's Date: 07/31/2011  Problem List:  Patient Active Problem List  Diagnoses  . TINEA PEDIS  . TESTOSTERONE DEFICIENCY  . HYPERLIPIDEMIA  . GOUT  . HYPERTENSION  . MYOCARDIAL INFARCTION, HX OF  . CAD, AUTOLOGOUS BYPASS GRAFT  . ALLERGIC RHINITIS  . BENIGN PROSTATIC HYPERTROPHY  . BENIGN PROSTATIC HYPERTROPHY, WITH OBSTRUCTION  . CELLULITIS, FOOT  . ACTINIC KERATOSIS  . OSTEOARTHRITIS  . LOW BACK PAIN SYNDROME  . MUSCLE CRAMPS  . MURMUR  . BRUIT  . KNEE SPRAIN  . CONTUSION, HIP  . COLONIC POLYPS, HX OF  . ACNE ROSACEA, HX OF  . VIRAL URI  . ACUTE BRONCHITIS  . Coronary atherosclerosis of native coronary artery  . Aortic valve disorders    Past Medical History:  Past Medical History  Diagnosis Date  . Allergic     (sees Dr. Corinda Gubler)  . Hyperlipidemia   . CAD (coronary artery disease)   . Myocardial infarction   . S/P CABG (coronary artery bypass graft) 2007     Coronary artery bypass grafting x4 with left internal(Edward Tyrone Sage, MD)  . Carotid bruit     carotid u/s 10/10: 0.39% bilaterally  . Hx of colonic polyps     (sees Dr. Marina Goodell)  . ED (erectile dysfunction)   . HTN (hypertension)   . Precancerous skin lesion     (sees Dr. Terri Piedra)  . Osteoarthritis     especially in left knee, (sees Dr. Dorene Grebe )  . Benign prostatic hypertrophy     (sees Dr. Chester Holstein  . Obesity   . Gout   . GERD (gastroesophageal reflux disease)    Past Surgical History:  Past Surgical History  Procedure Date  . Coronary artery bypass graft 2007  . Colonoscopy 06/30/2008    Repeat  in 5 years.  . Cataract extraction, bilateral   . Cardiac catheterization 10/29/2005  . Hammer toe repairs bilaterally   . Repair of right thumb injury   . Bilateral subcutaneous mastectomies     PT Assessment/Plan/Recommendation PT Assessment Clinical Impression Statement: 77 yo male  presents with decr independence and safety with mobility, amb, activity tol; will benefit from acute PT services to maximize independence and safety with mobility transfers, amb, to enable safe dc home PT Recommendation/Assessment: Patient will need skilled PT in the acute care venue PT Problem List: Decreased strength;Decreased range of motion;Decreased activity tolerance;Decreased mobility;Decreased knowledge of use of DME;Pain PT Therapy Diagnosis : Difficulty walking;Acute pain PT Plan PT Frequency: 7X/week PT Treatment/Interventions: DME instruction;Gait training;Stair training;Functional mobility training;Therapeutic activities;Therapeutic exercise;Patient/family education PT Recommendation Follow Up Recommendations: Home health PT Equipment Recommended: None recommended by PT PT Goals  Acute Rehab PT Goals PT Goal Formulation: With patient Time For Goal Achievement: 7 days Pt will go Supine/Side to Sit: with modified independence PT Goal: Supine/Side to Sit - Progress: Other (comment) Pt will go Sit to Supine/Side: with modified independence PT Goal: Sit to Supine/Side - Progress: Other (comment) Pt will go Sit to Stand: with modified independence PT Goal: Sit to Stand - Progress: Progressing toward goal Pt will go Stand to Sit: with modified independence PT Goal: Stand to Sit - Progress: Progressing toward goal Pt will Ambulate: >150 feet;with modified independence;with rolling walker PT Goal: Ambulate - Progress: Progressing toward goal Pt will Go Up / Down Stairs: 1-2 stairs (to simulate curb) PT Goal: Up/Down Stairs - Progress: Other (comment) Pt will Perform Home Exercise  Program: Independently PT Goal: Perform Home Exercise Program - Progress: Progressing toward goal  PT Evaluation Precautions/Restrictions  Precautions Precautions: Knee Required Braces or Orthoses: Yes Knee Immobilizer: On when out of bed or walking Restrictions LLE Weight Bearing: Weight bearing as  tolerated Prior Functioning  Home Living Lives With: Spouse Receives Help From: Family Type of Home: Apartment Home Layout: One level Home Access: Elevator Home Adaptive Equipment: Walker - rolling;Bedside commode/3-in-1 Prior Function Level of Independence: Independent with homemaking with ambulation Cognition Cognition Arousal/Alertness: Awake/alert Overall Cognitive Status: Appears within functional limits for tasks assessed Orientation Level: Oriented X4 Sensation/Coordination Sensation Light Touch: Appears Intact Coordination Gross Motor Movements are Fluid and Coordinated: Yes Fine Motor Movements are Fluid and Coordinated: Not tested Extremity Assessment RUE Assessment RUE Assessment: Within Functional Limits LUE Assessment LUE Assessment: Within Functional Limits RLE Assessment RLE Assessment: Within Functional Limits LLE Assessment LLE Assessment: Exceptions to [facility] LLE Strength LLE Overall Strength Comments: grossly decreased ROM and strength, limited by pain postop; AAROM flex to approx 50% by visual estimate; positive quad activation Mobility (including Balance) Transfers Transfers: Yes Sit to Stand: 4: Min assist;From chair/3-in-1;With upper extremity assist Sit to Stand Details (indicate cue type and reason): cues for safe transfer Stand to Sit: 4: Min assist (without phisical contact) Stand to Sit Details: good control Ambulation/Gait Ambulation/Gait: Yes Ambulation/Gait Assistance: 4: Min assist (without physical contact) Ambulation/Gait Assistance Details (indicate cue type and reason): initial cues for sequence, but pt manages well, other knee was replcced recently Ambulation Distance (Feet): 60 Feet Assistive device: Rolling walker Gait Pattern: Step-to pattern    Exercise  Total Joint Exercises Quad Sets: AROM;10 reps;Left;Seated Heel Slides: AAROM;Left;5 reps;Seated End of Session PT - End of Session Equipment Utilized During Treatment: Left  knee immobilizer;Gait belt Activity Tolerance: Patient tolerated treatment well Patient left: in chair;with call bell in reach General Behavior During Session: [facility] for tasks performed Cognition: [facility] for tasks performed  [facility] [place], [place] [phone]  07/31/2011, 2:03 PM

## 2011-08-01 ENCOUNTER — Encounter (HOSPITAL_COMMUNITY): Payer: Self-pay | Admitting: Orthopedic Surgery

## 2011-08-01 LAB — CBC
Platelets: 281 10*3/uL (ref 150–400)
RBC: 3.84 MIL/uL — ABNORMAL LOW (ref 4.22–5.81)
RDW: 15.6 % — ABNORMAL HIGH (ref 11.5–15.5)
WBC: 11.3 10*3/uL — ABNORMAL HIGH (ref 4.0–10.5)

## 2011-08-01 LAB — PROTIME-INR: INR: 1.33 (ref 0.00–1.49)

## 2011-08-01 MED ORDER — WARFARIN SODIUM 7.5 MG PO TABS
7.5000 mg | ORAL_TABLET | Freq: Once | ORAL | Status: AC
  Administered 2011-08-01: 7.5 mg via ORAL
  Filled 2011-08-01: qty 1

## 2011-08-01 NOTE — Progress Notes (Signed)
Order received, chart reviewed, spoke to pt about role of OT and he reports he does not foresee any issues with BADLs since he just had the other knee done 3 months ago. Per his report, his wife can A him prn and he has all DME. No OT needs identified, will sign off.  Ignacia Palma, [place] [phone] 08/01/2011

## 2011-08-01 NOTE — Progress Notes (Signed)
Physical Therapy Treatment Patient Details Name: Justin [name] MRN: [id number] DOB: [birth year] Today's Date: 08/01/2011  PT Assessment/Plan  PT - Assessment/Plan Comments on Treatment Session: Pt progressing very well today. Pt is really motivated PT Plan: Discharge plan remains appropriate PT Frequency: 7X/week Follow Up Recommendations: Home health PT Equipment Recommended: None recommended by PT PT Goals  Acute Rehab PT Goals PT Goal: Supine/Side to Sit - Progress: Met PT Goal: Sit to Supine/Side - Progress: Met PT Goal: Sit to Stand - Progress: Progressing toward goal PT Goal: Stand to Sit - Progress: Progressing toward goal PT Goal: Ambulate - Progress: Progressing toward goal PT Goal: Up/Down Stairs - Progress: Not met PT Goal: Perform Home Exercise Program - Progress: Progressing toward goal  PT Treatment Precautions/Restrictions  Precautions Precautions: Knee Required Braces or Orthoses: Yes Knee Immobilizer: On when out of bed or walking Restrictions Weight Bearing Restrictions: Yes LLE Weight Bearing: Weight bearing as tolerated Mobility (including Balance) Bed Mobility Bed Mobility: Yes Supine to Sit: 6: Modified independent (Device/Increase time) Sit to Supine - Left: 6: Modified independent (Device/Increase time) Transfers Transfers: Yes Sit to Stand: 5: Supervision;With upper extremity assist;From bed;From chair/3-in-1 Sit to Stand Details (indicate cue type and reason): Cues for safe hand placement Stand to Sit: 5: Supervision;To bed;To chair/3-in-1;With upper extremity assist Stand to Sit Details: Cues for safe hand placement Ambulation/Gait Ambulation/Gait: Yes Ambulation/Gait Assistance: 5: Supervision Ambulation/Gait Assistance Details (indicate cue type and reason): cues to increase step length and stand upright Ambulation Distance (Feet): 180 Feet Assistive device: Rolling walker Gait Pattern: Decreased stride length;Trunk flexed Gait velocity:  decreased    Exercise  Total Joint Exercises Ankle Circles/Pumps: AROM;Left;10 reps Quad Sets: 10 reps;Left;AROM Short Arc Quad: AROM;Strengthening;Left;10 reps Heel Slides: AROM;Strengthening;Left;10 reps Hip ABduction/ADduction: AROM;Strengthening;Left;10 reps Long Arc Quad: AAROM;Strengthening;Left;10 reps End of Session PT - End of Session Equipment Utilized During Treatment: Gait belt Activity Tolerance: Patient tolerated treatment well Patient left: in bed;in CPM;with call bell in reach General Behavior During Session: [facility] for tasks performed Cognition: [facility] for tasks performed  Keneshia Tena, Adline Potter 08/01/2011, 10:11 AM 08/01/2011 Fredrich Birks PTA [phone] pager [phone] office

## 2011-08-01 NOTE — Progress Notes (Signed)
ANTICOAGULATION CONSULT NOTE - Follow Up Consult  Pharmacy Consult for Coumadin Indication: VTE prophylaxis  Allergies  Allergen Reactions  . Peanut-Containing Drug Products   . Prednisone     Constipation & "he could not sleep"  . Sulfonamide Derivatives     REACTION: anaphylaxis    Patient Measurements: Height: 5\' 10"  (177.8 cm) Weight: 257 lb 0.9 oz (116.6 kg) IBW/kg (Calculated) : 73   Vital Signs: Temp: 99.5 F (37.5 C) (12/06 0602) Temp src: Oral (12/06 0602) BP: 99/52 mmHg (12/06 0602) Pulse Rate: 87  (12/06 0602)  Labs:  Basename 08/01/11 0610 07/31/11 0540 07/30/11 1804  HGB 10.4* 11.6* --  HCT 33.1* 36.8* --  PLT 281 327 --  APTT -- -- --  LABPROT 16.7* 13.9 14.8  INR 1.33 1.05 1.14  HEPARINUNFRC -- -- --  CREATININE -- 0.73 --  CKTOTAL -- -- --  CKMB -- -- --  TROPONINI -- -- --   Estimated Creatinine Clearance: 102 ml/min (by C-G formula based on Cr of 0.73).   Medications:  Scheduled:    . cholecalciferol  1,000 Units Oral Daily  . darifenacin  15 mg Oral Q0700  . loratadine  10 mg Oral Daily  . multivitamins ther. w/minerals  1 tablet Oral Daily  . niacin  500 mg Oral Q0700  . olopatadine  1 drop Both Eyes BID  . ramipril  5 mg Oral Daily  . simvastatin  20 mg Oral Q24H  . warfarin  7.5 mg Oral ONCE-1800    Assessment: 77 y/o male patient s/p L TKA receiving coumadin for dvt px. INR subtherapeutic but trending up on 7.5mg . No bleeding reported.  Goal of Therapy:  INR 2-3   Plan:  Coumadin 7.5mg  today and f/u in am.  Verlene Mayer, PharmD, BCPS Pager [phone]  08/01/2011,10:02 AM

## 2011-08-01 NOTE — Progress Notes (Signed)
Subjective: Pt stable, mobilizing well   Objective: Vital signs in last 24 hours: Temp:  [98.6 F (37 C)-100.1 F (37.8 C)] 99.5 F (37.5 C) (12/06 0602) Pulse Rate:  [75-94] 87  (12/06 0602) Resp:  [18-20] 18  (12/06 0602) BP: (99-117)/(52-63) 99/52 mmHg (12/06 0602) SpO2:  [93 %-98 %] 93 % (12/06 0602)  Intake/Output from previous day: 12/05 0701 - 12/06 0700 In: 840 [P.O.:840] Out: 1700 [Urine:1700] Intake/Output this shift:    Exam:  Sensation intact distally Intact pulses distally Dorsiflexion/Plantar flexion intact  Labs:  Basename 08/01/11 0610 07/31/11 0540  HGB 10.4* 11.6*    Basename 08/01/11 0610 07/31/11 0540  WBC 11.3* 12.2*  RBC 3.84* 4.27  HCT 33.1* 36.8*  PLT 281 327    Basename 07/31/11 0540  NA 137  K 4.4  CL 101  CO2 26  BUN 11  CREATININE 0.73  GLUCOSE 104*  CALCIUM 8.4    Basename 08/01/11 0610 07/31/11 0540  LABPT -- --  INR 1.33 1.05    Assessment/Plan: Possible dc friday   DEAN,GREGORY SCOTT 08/01/2011, 8:18 AM

## 2011-08-02 LAB — CBC
Platelets: 255 10*3/uL (ref 150–400)
RDW: 15.5 % (ref 11.5–15.5)
WBC: 10.6 10*3/uL — ABNORMAL HIGH (ref 4.0–10.5)

## 2011-08-02 LAB — PROTIME-INR
INR: 1.65 — ABNORMAL HIGH (ref 0.00–1.49)
Prothrombin Time: 19.8 seconds — ABNORMAL HIGH (ref 11.6–15.2)

## 2011-08-02 MED ORDER — CEFAZOLIN SODIUM 1-5 GM-% IV SOLN
INTRAVENOUS | Status: DC | PRN
  Administered 2011-07-30: 2 g via INTRAVENOUS

## 2011-08-02 MED ORDER — WARFARIN SODIUM 7.5 MG PO TABS
7.5000 mg | ORAL_TABLET | Freq: Once | ORAL | Status: DC
  Filled 2011-08-02: qty 1

## 2011-08-02 NOTE — Progress Notes (Signed)
ANTICOAGULATION CONSULT NOTE - Follow Up Consult  Pharmacy Consult for Coumadin Indication: VTE prophylaxis  Allergies  Allergen Reactions  . Peanut-Containing Drug Products   . Prednisone     Constipation & "he could not sleep"  . Sulfonamide Derivatives     REACTION: anaphylaxis    Patient Measurements: Height: 5\' 10"  (177.8 cm) Weight: 257 lb 0.9 oz (116.6 kg) IBW/kg (Calculated) : 73    Vital Signs: Temp: 97.9 F (36.6 C) (12/07 0624) Temp src: Oral (12/07 0624) BP: 126/64 mmHg (12/07 0624) Pulse Rate: 76  (12/07 0624)  Labs:  Basename 08/02/11 0520 08/01/11 0610 07/31/11 0540  HGB 10.3* 10.4* --  HCT 32.6* 33.1* 36.8*  PLT 255 281 327  APTT -- -- --  LABPROT 19.8* 16.7* 13.9  INR 1.65* 1.33 1.05  HEPARINUNFRC -- -- --  CREATININE -- -- 0.73  CKTOTAL -- -- --  CKMB -- -- --  TROPONINI -- -- --   Estimated Creatinine Clearance: 102 ml/min (by C-G formula based on Cr of 0.73).   Medications:  Scheduled:    . cholecalciferol  1,000 Units Oral Daily  . darifenacin  15 mg Oral Q0700  . loratadine  10 mg Oral Daily  . multivitamins ther. w/minerals  1 tablet Oral Daily  . niacin  500 mg Oral Q0700  . olopatadine  1 drop Both Eyes BID  . ramipril  5 mg Oral Daily  . simvastatin  20 mg Oral Q24H  . warfarin  7.5 mg Oral ONCE-1800    Assessment: 77 y/o male patient receiving coumadin for DVT px. INR subtherapeutic but trending up nicely on 7.5mg . No bleeding reported.  Goal of Therapy:  INR 2-3   Plan:  Repeat coumadin 7.5mg  today, education complete. Recommend this to be daily dose.  Verlene Mayer, PharmD, BCPS Pager [phone] 08/02/2011,10:00 AM

## 2011-08-02 NOTE — Discharge Summary (Signed)
Physician Discharge Summary  Patient ID: Justin [name] MRN: [id number] DOB/AGE: [birth year] 77 y.o.  Admit date: 07/30/2011 Discharge date: 08/02/2011  Admission Diagnoses:  Left knee arthritis  Discharge Diagnoses:  Same  Surgeries: Procedure(s): COMPUTER ASSISTED TOTAL KNEE ARTHROPLASTY on 07/30/2011   Consultants:    Discharged Condition: Stable  Hospital Course: Justin [name] is an 77 y.o. male who was admitted 07/30/2011 with a chief complaint ofleft knee pain, and found to have a diagnosis of left knee arthritis.  They were brought to the operating room on 07/30/2011 and underwent the above named procedures.    Antibiotics given:  Anti-infectives     Start     Dose/Rate Route Frequency Ordered Stop   07/30/11 2000   ceFAZolin (ANCEF) IVPB 1 g/50 mL premix        1 g 100 mL/hr over 30 Minutes Intravenous Every 6 hours 07/30/11 1739 07/31/11 0830   07/29/11 1500   ceFAZolin (ANCEF) IVPB 2 g/50 mL premix  Status:  Discontinued        2 g 100 mL/hr over 30 Minutes Intravenous 60 min pre-op 07/29/11 1447 07/30/11 1716        .  Recent vital signs:  Filed Vitals:   08/02/11 0624  BP: 126/64  Pulse: 76  Temp: 97.9 F (36.6 C)  Resp: 18    Recent laboratory studies:  Results for orders placed during the hospital encounter of 07/30/11  PROTIME-INR      Component Value Range   Prothrombin Time 13.9  11.6 - 15.2 (seconds)   INR 1.05  0.00 - 1.49   CBC      Component Value Range   WBC 12.2 (*) 4.0 - 10.5 (K/uL)   RBC 4.27  4.22 - 5.81 (MIL/uL)   Hemoglobin 11.6 (*) 13.0 - 17.0 (g/dL)   HCT 84.6 (*) 96.2 - 52.0 (%)   MCV 86.2  78.0 - 100.0 (fL)   MCH 27.2  26.0 - 34.0 (pg)   MCHC 31.5  30.0 - 36.0 (g/dL)   RDW 95.2 (*) 84.1 - 15.5 (%)   Platelets 327  150 - 400 (K/uL)  BASIC METABOLIC PANEL      Component Value Range   Sodium 137  135 - 145 (mEq/L)   Potassium 4.4  3.5 - 5.1 (mEq/L)   Chloride 101  96 - 112 (mEq/L)   CO2 26  19 - 32 (mEq/L)   Glucose, Bld 104 (*) 70 - 99 (mg/dL)   BUN 11  6 - 23 (mg/dL)   Creatinine, Ser 3.24  0.50 - 1.35 (mg/dL)   Calcium 8.4  8.4 - 40.1 (mg/dL)   GFR calc non Af Amer 89 (*) >90 (mL/min)   GFR calc Af Amer >90  >90 (mL/min)  PROTIME-INR      Component Value Range   Prothrombin Time 14.8  11.6 - 15.2 (seconds)   INR 1.14  0.00 - 1.49   PROTIME-INR      Component Value Range   Prothrombin Time 16.7 (*) 11.6 - 15.2 (seconds)   INR 1.33  0.00 - 1.49   CBC      Component Value Range   WBC 11.3 (*) 4.0 - 10.5 (K/uL)   RBC 3.84 (*) 4.22 - 5.81 (MIL/uL)   Hemoglobin 10.4 (*) 13.0 - 17.0 (g/dL)   HCT 02.7 (*) 25.3 - 52.0 (%)   MCV 86.2  78.0 - 100.0 (fL)   MCH 27.1  26.0 - 34.0 (pg)   MCHC 31.4  30.0 - 36.0 (g/dL)   RDW 16.1 (*) 09.6 - 15.5 (%)   Platelets 281  150 - 400 (K/uL)  PROTIME-INR      Component Value Range   Prothrombin Time 19.8 (*) 11.6 - 15.2 (seconds)   INR 1.65 (*) 0.00 - 1.49   CBC      Component Value Range   WBC 10.6 (*) 4.0 - 10.5 (K/uL)   RBC 3.77 (*) 4.22 - 5.81 (MIL/uL)   Hemoglobin 10.3 (*) 13.0 - 17.0 (g/dL)   HCT 04.5 (*) 40.9 - 52.0 (%)   MCV 86.5  78.0 - 100.0 (fL)   MCH 27.3  26.0 - 34.0 (pg)   MCHC 31.6  30.0 - 36.0 (g/dL)   RDW 81.1  91.4 - 78.2 (%)   Platelets 255  150 - 400 (K/uL)    Discharge Medications:   Discharge Medication List as of 08/02/2011 11:59 AM    CONTINUE these medications which have NOT CHANGED   Details  atorvastatin (LIPITOR) 20 MG tablet Take 20 mg by mouth daily.  , Until Discontinued, Historical Med    Cholecalciferol (VITAMIN D) 1000 UNITS capsule Take 1,000 Units by mouth daily.  , Until Discontinued, Historical Med    darifenacin (ENABLEX) 15 MG 24 hr tablet Take 15 mg by mouth every morning.  , Until Discontinued, Historical Med    fexofenadine (ALLEGRA) 180 MG tablet Take 180 mg by mouth 2 (two) times daily. , Until Discontinued, Historical Med    Multiple Vitamin (MULTIVITAMIN) tablet Take 1 tablet by mouth daily.   , Until Discontinued, Historical Med    niacin 500 MG tablet Take 500 mg by mouth every morning. , Until Discontinued, Historical Med    Olopatadine HCl (PATADAY) 0.2 % SOLN Place 1 drop into both eyes every morning.  , Until Discontinued, Historical Med    OVER THE COUNTER MEDICATION Take 2 tablets by mouth 2 (two) times daily. areds eye vitamin , Until Discontinued, Historical Med    ramipril (ALTACE) 5 MG capsule Take 1 capsule (5 mg total) by mouth daily., Starting 06/18/2011, Until Discontinued, Normal    tadalafil (CIALIS) 5 MG tablet Take 5 mg by mouth as needed. For erectile dysfunction, Until Discontinued, Historical Med    temazepam (RESTORIL) 30 MG capsule Take 30 mg by mouth at bedtime as needed. For sleep, Until Discontinued, Historical Med      STOP taking these medications     aspirin 325 MG tablet      diclofenac (VOLTAREN) 75 MG EC tablet      Omega-3 Fatty Acids (FISH OIL PO)      Testosterone (ANDROGEL PUMP TD)         Diagnostic Studies: Dg Chest 2 View  07/22/2011  *RADIOLOGY REPORT*  Clinical Data: Preop for left knee replacement  CHEST - 2 VIEW  Comparison: Chest x-ray of 09/06/2010  Findings: No active infiltrate or effusion is seen.  Moderate cardiomegaly is stable.  Median sternotomy sutures are noted from prior CABG.  There are diffuse degenerative changes throughout the thoracic spine .  IMPRESSION:   Stable cardiomegaly.  No active lung disease.  Original Report Authenticated By: Juline Patch, M.D.   X-ray Knee Left Port  07/30/2011  *RADIOLOGY REPORT*  Clinical Data: Postop arthroplasty  PORTABLE LEFT KNEE - 1-2 VIEW  Comparison: None.  Findings: Total knee arthroplasty is been performed.  Components appear well positioned.  Suprapatellar drain in place.  No radiographically detectable complication.  IMPRESSION: Good appearance  following total knee arthroplasty.  Original Report Authenticated By: Thomasenia Sales, M.D.    Disposition: Home or Self  Care  Discharge Orders    Future Appointments: Provider: Department: Dept Phone: Center:   09/30/2011 9:00 AM [facility] [phone] [facility]   10/07/2011 9:00 AM Nel[facility] Sal[facility] [phone] [facility]     Future Orders Please Complete By Expires   Diet - low sodium heart healthy      Call MD / Call 911      Comments:   If you experience chest pain or shortness of breath, CALL 911 and be transported to the hospital emergency room.  If you develope a fever above 101 F, pus (white drainage) or increased drainage or redness at the wound, or calf pain, call your surgeon's office.   Constipation Prevention      Comments:   Drink plenty of fluids.  Prune juice may be helpful.  You may use a stool softener, such as Colace (over the counter) 100 mg twice a day.  Use MiraLax (over the counter) for constipation as needed.   Increase activity slowly as tolerated      Weight Bearing as taught in Physical Therapy      Comments:   Use a walker or crutches as instructed.   Discharge instructions      Comments:   1.Weight bearing as tolerated left leg 2.cpm 2 hours per 8 3. Keep incision dry         Signed: Cammy Copa 08/02/2011, 5:45 PM

## 2011-08-02 NOTE — Progress Notes (Signed)
Utilization review completed. Lataria Courser, RN, BSN. 08/02/11  

## 2011-08-02 NOTE — Progress Notes (Signed)
Physical Therapy Treatment Patient Details Name: Justin [name] MRN: [id number] DOB: 11/02/1933 Today's Date: 08/02/2011  PT Assessment/Plan  PT - Assessment/Plan Comments on Treatment Session: Pt.continues to progress well. Very motivated PT Plan: Discharge plan remains appropriate PT Frequency: 7X/week Follow Up Recommendations: Home health PT Equipment Recommended: None recommended by PT PT Goals  Acute Rehab PT Goals PT Goal: Supine/Side to Sit - Progress: Met PT Goal: Sit to Supine/Side - Progress: Met PT Goal: Sit to Stand - Progress: Met PT Goal: Stand to Sit - Progress: Met PT Goal: Ambulate - Progress: Progressing toward goal PT Goal: Up/Down Stairs - Progress: Other (comment) (pt stated that he had no stairs and declined practice) PT Goal: Perform Home Exercise Program - Progress: Met  PT Treatment Precautions/Restrictions  Precautions Precautions: Knee Required Braces or Orthoses: Yes Knee Immobilizer: On when out of bed or walking Restrictions Weight Bearing Restrictions: Yes LLE Weight Bearing: Weight bearing as tolerated Mobility (including Balance) Bed Mobility Supine to Sit: 6: Modified independent (Device/Increase time) Sit to Supine - Left: 6: Modified independent (Device/Increase time) Transfers Sit to Stand: 6: Modified independent (Device/Increase time);With upper extremity assist;From chair/3-in-1;From bed;With armrests Stand to Sit: 6: Modified independent (Device/Increase time);To bed;To chair/3-in-1;With armrests;With upper extremity assist Ambulation/Gait Ambulation/Gait: Yes Ambulation/Gait Assistance: 5: Supervision Ambulation/Gait Assistance Details (indicate cue type and reason): Cues to stand upright and closer to RW Assistive device: Rolling walker Gait Pattern: Step-through pattern    Exercise  Total Joint Exercises Quad Sets: AROM;20 reps;Left;Strengthening Short Arc Quad: AROM;Strengthening;Left;Other reps (comment) (15) Heel  Slides: AAROM;Strengthening;Left;20 reps Straight Leg Raises: AROM;Strengthening;Left;15 reps End of Session PT - End of Session Equipment Utilized During Treatment: Gait belt Activity Tolerance: Patient tolerated treatment well Patient left: in chair;with call bell in reach Nurse Communication: Mobility status for transfers;Mobility status for ambulation General Behavior During Session: [facility] for tasks performed Cognition: [facility] for tasks performed  Fredrich Birks 08/02/2011, 11:32 AM 08/02/2011 Fredrich Birks PTA [phone] pager [phone] office

## 2011-08-02 NOTE — Progress Notes (Signed)
Subjective: Pt stable ready for dc  Objective: Vital signs in last 24 hours: Temp:  [97.9 F (36.6 C)-98 F (36.7 C)] 97.9 F (36.6 C) (12/07 0624) Pulse Rate:  [75-84] 76  (12/07 0624) Resp:  [18] 18  (12/07 0624) BP: (119-150)/(63-72) 126/64 mmHg (12/07 0624) SpO2:  [94 %-98 %] 98 % (12/07 0624)  Intake/Output from previous day: 12/06 0701 - 12/07 0700 In: 1640 [P.O.:1640] Out: 452 [Urine:452] Intake/Output this shift:    Exam:  Sensation intact distally Intact pulses distally Dorsiflexion/Plantar flexion intact  Labs:  Basename 08/02/11 0520 08/01/11 0610 07/31/11 0540  HGB 10.3* 10.4* 11.6*    Basename 08/02/11 0520 08/01/11 0610  WBC 10.6* 11.3*  RBC 3.77* 3.84*  HCT 32.6* 33.1*  PLT 255 281    Basename 07/31/11 0540  NA 137  K 4.4  CL 101  CO2 26  BUN 11  CREATININE 0.73  GLUCOSE 104*  CALCIUM 8.4    Basename 08/02/11 0520 08/01/11 0610  LABPT -- --  INR 1.65* 1.33    Assessment/Plan: Dc today   Justin [name] 08/02/2011, 8:07 AM

## 2011-08-02 NOTE — Addendum Note (Signed)
Addendum  created 08/02/11 1043 by Lenice Llamas Neena Beecham   Modules edited:Anesthesia Medication Administration

## 2011-08-02 NOTE — Progress Notes (Signed)
Pt is s/p L TKR. Knee precautions. +cms 1+ edema LLE. Ice prn LLE. Pt is WBAT with RW CPM per order. Lungs CTA. No s/sx resp or cardiac distress and no c/o such. Vital signs stable. Pt performs IS per order. Pt has 3 pink mepilex's to L knee that are dry and intact and per pt were placed yesterday. Pt has two small sutured areas adjacent dry dressings of L knee. No pressure skin issues of sacral area or heels. Pt can turn self but doesn't. Pt repts LBM 12/3. Pt repts passing gas and denies nausea or vomiting and is tolerating diet fair to good. BS+x4. Abdomen is soft flat nontender and nondistended. PAS and coumadin for DVT prophylaxis.

## 2011-09-19 ENCOUNTER — Other Ambulatory Visit: Payer: Self-pay | Admitting: Family Medicine

## 2011-09-30 ENCOUNTER — Other Ambulatory Visit: Payer: Medicare Other

## 2011-10-01 ENCOUNTER — Other Ambulatory Visit (INDEPENDENT_AMBULATORY_CARE_PROVIDER_SITE_OTHER): Payer: Medicare Other

## 2011-10-01 DIAGNOSIS — Z0000 Encounter for general adult medical examination without abnormal findings: Secondary | ICD-10-CM

## 2011-10-01 DIAGNOSIS — Z125 Encounter for screening for malignant neoplasm of prostate: Secondary | ICD-10-CM

## 2011-10-01 DIAGNOSIS — Z79899 Other long term (current) drug therapy: Secondary | ICD-10-CM

## 2011-10-01 LAB — POCT URINALYSIS DIPSTICK
Bilirubin, UA: NEGATIVE
Blood, UA: NEGATIVE
Glucose, UA: NEGATIVE
Nitrite, UA: NEGATIVE
Urobilinogen, UA: 0.2

## 2011-10-01 LAB — PSA: PSA: 2 ng/mL (ref 0.10–4.00)

## 2011-10-01 LAB — TSH: TSH: 3.93 u[IU]/mL (ref 0.35–5.50)

## 2011-10-01 LAB — LIPID PANEL
Cholesterol: 107 mg/dL (ref 0–200)
LDL Cholesterol: 58 mg/dL (ref 0–99)
Total CHOL/HDL Ratio: 3
Triglycerides: 92 mg/dL (ref 0.0–149.0)

## 2011-10-01 LAB — BASIC METABOLIC PANEL
Chloride: 104 mEq/L (ref 96–112)
GFR: 106.19 mL/min (ref >60.00)
Potassium: 4.5 mEq/L (ref 3.5–5.1)
Sodium: 139 mEq/L (ref 135–145)

## 2011-10-01 LAB — CBC WITH DIFFERENTIAL/PLATELET
Basophils Relative: 0.7 % (ref 0.0–3.0)
Eosinophils Relative: 2.6 % (ref 0.0–5.0)
HCT: 36.6 % — ABNORMAL LOW (ref 39.0–52.0)
Lymphs Abs: 2 10*3/uL (ref 0.7–4.0)
MCV: 82.6 fl (ref 78.0–100.0)
Monocytes Absolute: 0.8 10*3/uL (ref 0.1–1.0)
Monocytes Relative: 9 % (ref 3.0–12.0)
RBC: 4.43 Mil/uL (ref 4.22–5.81)
WBC: 8.7 10*3/uL (ref 4.5–10.5)

## 2011-10-01 LAB — HEPATIC FUNCTION PANEL
ALT: 16 U/L (ref 0–53)
AST: 17 U/L (ref 0–37)
Bilirubin, Direct: 0.1 mg/dL (ref 0.0–0.3)
Total Protein: 6.5 g/dL (ref 6.0–8.3)

## 2011-10-04 NOTE — Progress Notes (Signed)
Quick Note:  Left voice message ______ 

## 2011-10-07 ENCOUNTER — Encounter: Payer: Self-pay | Admitting: Family Medicine

## 2011-10-07 ENCOUNTER — Ambulatory Visit (INDEPENDENT_AMBULATORY_CARE_PROVIDER_SITE_OTHER): Payer: Medicare Other | Admitting: Family Medicine

## 2011-10-07 VITALS — BP 126/62 | HR 93 | Temp 97.7°F | Ht 67.5 in | Wt 257.0 lb

## 2011-10-07 DIAGNOSIS — Z0000 Encounter for general adult medical examination without abnormal findings: Secondary | ICD-10-CM

## 2011-10-07 MED ORDER — TADALAFIL 10 MG PO TABS: 10.0000 mg | ORAL_TABLET | Freq: Every day | ORAL | Status: DC | PRN

## 2011-10-07 MED ORDER — LOSARTAN POTASSIUM 100 MG PO TABS: 100.0000 mg | ORAL_TABLET | Freq: Every day | ORAL | Status: DC

## 2011-10-07 NOTE — Progress Notes (Signed)
  Subjective:    Patient ID: Justin [name], male    DOB: [birth year], 78 y.o.   MRN: [id number]  HPI 78 yr old male for a cpx. He feels fine except for a chronic dry cough which has been going on for about 6 months. He has been on Ramipril for about 3 years. He had bilateral knee replacements last December, and these went quite well. He is still in PT 3 days a week. He is due to see his cardiologist and his urologist soon.    Review of Systems  Constitutional: Negative.   HENT: Negative.   Eyes: Negative.   Respiratory: Negative.   Cardiovascular: Negative.   Gastrointestinal: Negative.   Genitourinary: Negative.   Musculoskeletal: Negative.   Skin: Negative.   Neurological: Negative.   Hematological: Negative.   Psychiatric/Behavioral: Negative.        Objective:   Physical Exam  Constitutional: He is oriented to person, place, and time. He appears well-developed and well-nourished. No distress.  HENT:  Head: Normocephalic and atraumatic.  Right Ear: External ear normal.  Left Ear: External ear normal.  Nose: Nose normal.  Mouth/Throat: Oropharynx is clear and moist. No oropharyngeal exudate.  Eyes: Conjunctivae and EOM are normal. Pupils are equal, round, and reactive to light. Right eye exhibits no discharge. Left eye exhibits no discharge. No scleral icterus.  Neck: Neck supple. No JVD present. No tracheal deviation present. No thyromegaly present.  Cardiovascular: Normal rate, regular rhythm and intact distal pulses.  Exam reveals no gallop and no friction rub.   Murmur heard.      3/6 holosystolic murmur over the aortic area   Pulmonary/Chest: Effort normal and breath sounds normal. No respiratory distress. He has no wheezes. He has no rales. He exhibits no tenderness.  Abdominal: Soft. Bowel sounds are normal. He exhibits no distension and no mass. There is no tenderness. There is no rebound and no guarding.  Genitourinary: Rectum normal, prostate normal and penis  normal. Guaiac negative stool. No penile tenderness.  Musculoskeletal: Normal range of motion. He exhibits no edema and no tenderness.  Lymphadenopathy:    He has no cervical adenopathy.  Neurological: He is alert and oriented to person, place, and time. He has normal reflexes. No cranial nerve deficit. He exhibits normal muscle tone. Coordination normal.  Skin: Skin is warm and dry. No rash noted. He is not diaphoretic. No erythema. No pallor.  Psychiatric: He has a normal mood and affect. His behavior is normal. Judgment and thought content normal.          Assessment & Plan:  Well exam. His cough may be related to his ACE inhibitor, so we will switch him from ramipril to Losartan. His PSA jumped over a point from last year, so he will address this with his Urologist soon.

## 2012-01-24 ENCOUNTER — Encounter: Payer: Self-pay | Admitting: Cardiovascular Disease

## 2012-01-24 ENCOUNTER — Ambulatory Visit (INDEPENDENT_AMBULATORY_CARE_PROVIDER_SITE_OTHER): Payer: Medicare Other | Admitting: Cardiovascular Disease

## 2012-01-24 VITALS — BP 110/48 | HR 68 | Ht 70.0 in | Wt 269.0 lb

## 2012-01-24 DIAGNOSIS — I2510 Atherosclerotic heart disease of native coronary artery without angina pectoris: Secondary | ICD-10-CM

## 2012-01-24 DIAGNOSIS — I359 Nonrheumatic aortic valve disorder, unspecified: Secondary | ICD-10-CM

## 2012-01-24 NOTE — Assessment & Plan Note (Signed)
Moderate stenosis by echo May 2012. Will repeat echo now.

## 2012-01-24 NOTE — Progress Notes (Signed)
History of Present Illness: 78 yo WM with a h/o HTN, HL and CAD s/p 4V CABG March 2007 who is here today for cardiac follow up. He has been followed in the past by Dr. Shelva Majestic and most recently by Dr. Gala Romney. He has not had a cardiac cath since his bypass in 2007. Carotid u/s in 10/10: 0-39% bilaterally. Had nuclear stress test 4/11 EF 54%. Inferior, inferoseptal and apical defect consistent with scar and possible soft tissue attenuation. No signficiant ischemia. Echo 5/12: Normal EF. Moderate Aortic valve stenosis.   He is here today for follow up. He has been doing well. No CP or dyspnea. Compliant with medications. He exercises three days per week. Recent bilateral knee replacements.  Primary Care Physician: Dr. Clent Ridges  Last Lipid Profile: Lipid Panel     Component Value Date/Time   CHOL 107 10/01/2011 0922   TRIG 92.0 10/01/2011 0922   HDL 30.60* 10/01/2011 0922   CHOLHDL 3 10/01/2011 0922   VLDL 18.4 10/01/2011 0922   LDLCALC 58 10/01/2011 5784     Past Medical History  Diagnosis Date  . Allergic     (sees Dr. Corinda Gubler)  . Hyperlipidemia   . CAD (coronary artery disease)     sees Dr. Gala Romney   . Myocardial infarction   . S/P CABG (coronary artery bypass graft) 2007     Coronary artery bypass grafting x4 with left internal(Edward Tyrone Sage, MD)  . Carotid bruit     carotid u/s 10/10: 0.39% bilaterally  . Hx of colonic polyps     (sees Dr. Marina Goodell)  . ED (erectile dysfunction)   . HTN (hypertension)   . Precancerous skin lesion     (sees Dr. Terri Piedra)  . Osteoarthritis     especially in left knee, (sees Dr. Dorene Grebe )  . Benign prostatic hypertrophy     (sees Dr. Chester Holstein  . Obesity   . Gout   . GERD (gastroesophageal reflux disease)     Past Surgical History  Procedure Date  . Coronary artery bypass graft 2007  . Colonoscopy 06/30/2008    per Dr. Marina Goodell, Repeat  in 5 years.  . Cataract extraction, bilateral   . Cardiac catheterization 10/29/2005  . Hammer toe repairs  bilaterally   . Repair of right thumb injury   . Bilateral subcutaneous mastectomies   . Knee arthroplasty 07/30/2011    Procedure: COMPUTER ASSISTED TOTAL KNEE ARTHROPLASTY;  Surgeon: Cammy Copa;  Location: [facility] OR;  Service: Orthopedics;  Laterality: Left;  left total knee arthroplasty  . Replacement total knee bilateral 2012    Current Outpatient Prescriptions  Medication Sig Dispense Refill  . aspirin 325 MG tablet Take 325 mg by mouth daily.      [name] darifenacin (ENABLEX) 15 MG 24 hr tablet Take 15 mg by mouth every morning.        . diclofenac (VOLTAREN) 75 MG EC tablet Take 75 mg by mouth 2 (two) times daily.      . fesoterodine (TOVIAZ) 8 MG TB24 Take 8 mg by mouth daily.      . fexofenadine (ALLEGRA) 180 MG tablet Take 180 mg by mouth 2 (two) times daily.       . fish oil-omega-3 fatty acids 1000 MG capsule Take 2 g by mouth daily.      [name] LIPITOR 20 MG tablet TAKE 1 TABLET ONCE DAILY.  90 each  3  . losartan (COZAAR) 100 MG tablet Take 1 tablet (100 mg total) by mouth  daily.  90 tablet  3  . Multiple Vitamin (MULTIVITAMIN) tablet Take 1 tablet by mouth daily.        . niacin 500 MG tablet Take 500 mg by mouth every morning.       . Olopatadine HCl (PATADAY) 0.2 % SOLN Place 1 drop into both eyes every morning.        [name] OVER THE COUNTER MEDICATION Take 2 tablets by mouth 2 (two) times daily. areds eye vitamin       . Probiotic Product (PROBIOTIC FORMULA) CAPS Take by mouth daily.      . Testosterone (ANDROGEL) 20.25 MG/1.25GM (1.62%) GEL Place 2 application onto the skin daily.       . tadalafil (CIALIS) 10 MG tablet Take 1 tablet (10 mg total) by mouth daily as needed for erectile dysfunction.  10 tablet  11    Allergies  Allergen Reactions  . Peanut-Containing Drug Products   . Prednisone     Constipation & "he could not sleep"  . Sulfonamide Derivatives     REACTION: anaphylaxis    History   Social History  . Marital Status: Married    Spouse Name: N/A    Number  of Children: N/A  . Years of Education: N/A   Occupational History  . general contractor     builds malls   Social History Main Topics  . Smoking status: Never Smoker   . Smokeless tobacco: Never Used  . Alcohol Use: 3.5 oz/week    7 drink(s) per week  . Drug Use: No  . Sexually Active: Not on file   Other Topics Concern  . Not on file   Social History Narrative   FH of CAD, Male 1st degree relative less than age 2.    Family History  Problem Relation Age of Onset  . Heart attack Father 53  . Heart failure Mother 23    Review of Systems:  As stated in the HPI and otherwise negative.   BP 110/48  Pulse 68  Ht 5\' 10"  (1.778 m)  Wt 269 lb (122.018 kg)  BMI 38.60 kg/m2  Physical Examination: General: Well developed, well nourished, NAD HEENT: OP clear, mucus membranes moist SKIN: warm, dry. No rashes. Neuro: No focal deficits Musculoskeletal: Muscle strength 5/5 all ext Psychiatric: Mood and affect normal Neck: No JVD, no carotid bruits, no thyromegaly, no lymphadenopathy. Lungs:Clear bilaterally, no wheezes, rhonci, crackles Cardiovascular: Regular rate and rhythm. Systolic outflow murmur. No gallops or rubs. Abdomen:Soft. Bowel sounds present. Non-tender.  Extremities: Trace to 1+bilateral lower extremity edema. Pulses are 2 + in the bilateral DP/PT.  Echo May 2012:  Left ventricle: The cavity size was normal. Wall thickness was increased in a pattern of moderate LVH. Systolic function was normal. The estimated ejection fraction was in the range of 60% to 65%. Wall motion was normal; there were no regional wall motion abnormalities. Doppler parameters are consistent with abnormal left ventricular relaxation (grade 1 diastolic dysfunction). - Aortic valve: Valve mobility was restricted. There was moderate stenosis. Mild regurgitation. Mean gradient: 23mm Hg (S). Peak gradient: 42mm Hg (S). - Mitral valve: Calcified annulus. - Left atrium: The atrium was  moderately dilated. - Right atrium: The atrium was mildly dilated. - Atrial septum: There was an atrial septal aneurysm.

## 2012-01-24 NOTE — Patient Instructions (Signed)

## 2012-01-24 NOTE — Assessment & Plan Note (Signed)
Stable. No changes. BP and lipids are at goal.

## 2012-01-28 ENCOUNTER — Ambulatory Visit (HOSPITAL_COMMUNITY): Payer: Medicare Other | Attending: Cardiovascular Disease

## 2012-01-28 DIAGNOSIS — I2510 Atherosclerotic heart disease of native coronary artery without angina pectoris: Secondary | ICD-10-CM | POA: Insufficient documentation

## 2012-01-28 DIAGNOSIS — I10 Essential (primary) hypertension: Secondary | ICD-10-CM | POA: Insufficient documentation

## 2012-01-28 DIAGNOSIS — I519 Heart disease, unspecified: Secondary | ICD-10-CM | POA: Insufficient documentation

## 2012-01-28 DIAGNOSIS — E785 Hyperlipidemia, unspecified: Secondary | ICD-10-CM | POA: Insufficient documentation

## 2012-01-28 DIAGNOSIS — I517 Cardiomegaly: Secondary | ICD-10-CM | POA: Insufficient documentation

## 2012-01-28 DIAGNOSIS — E669 Obesity, unspecified: Secondary | ICD-10-CM | POA: Insufficient documentation

## 2012-01-28 DIAGNOSIS — I252 Old myocardial infarction: Secondary | ICD-10-CM | POA: Insufficient documentation

## 2012-01-28 DIAGNOSIS — I359 Nonrheumatic aortic valve disorder, unspecified: Secondary | ICD-10-CM | POA: Insufficient documentation

## 2012-01-28 NOTE — Progress Notes (Signed)
Echocardiogram performed.  

## 2012-01-29 ENCOUNTER — Telehealth: Payer: Self-pay | Admitting: Cardiovascular Disease

## 2012-01-29 NOTE — Telephone Encounter (Signed)
Patient aware of 2-D echo results.

## 2012-01-29 NOTE — Telephone Encounter (Signed)
Fu call Patient calling back about test results 

## 2012-04-06 ENCOUNTER — Encounter: Payer: Self-pay | Admitting: Family Medicine

## 2012-04-06 ENCOUNTER — Ambulatory Visit (INDEPENDENT_AMBULATORY_CARE_PROVIDER_SITE_OTHER): Payer: Medicare Other | Admitting: Family Medicine

## 2012-04-06 VITALS — Temp 98.2°F | Wt 273.0 lb

## 2012-04-06 DIAGNOSIS — Z23 Encounter for immunization: Secondary | ICD-10-CM

## 2012-04-06 DIAGNOSIS — Z7189 Other specified counseling: Secondary | ICD-10-CM

## 2012-04-06 DIAGNOSIS — Z299 Encounter for prophylactic measures, unspecified: Secondary | ICD-10-CM

## 2012-04-06 DIAGNOSIS — I10 Essential (primary) hypertension: Secondary | ICD-10-CM

## 2012-04-06 DIAGNOSIS — IMO0002 Reserved for concepts with insufficient information to code with codable children: Secondary | ICD-10-CM

## 2012-04-06 DIAGNOSIS — I25810 Atherosclerosis of coronary artery bypass graft(s) without angina pectoris: Secondary | ICD-10-CM

## 2012-04-06 MED ORDER — AZITHROMYCIN 250 MG PO TABS: ORAL_TABLET | ORAL | Status: AC

## 2012-04-06 MED ORDER — CIPROFLOXACIN HCL 500 MG PO TABS: 500.0000 mg | ORAL_TABLET | Freq: Two times a day (BID) | ORAL | Status: AC

## 2012-04-06 NOTE — Progress Notes (Signed)
  Subjective:    Patient ID: Justin [name], male    DOB: 04/19/1934, 78 y.o.   MRN: [id number]  HPI Here to prepare for an upcoming cruise to the [place], [place] and [place]. He wants to be immunized for hepatitis and to get some antibiotics in case he gets sick. He is doing well in general.    Review of Systems  Constitutional: Negative.   Respiratory: Negative.   Cardiovascular: Negative.        Objective:   Physical Exam  Constitutional: He appears well-developed and well-nourished.  Cardiovascular: Normal rate, regular rhythm, normal heart sounds and intact distal pulses.   Pulmonary/Chest: Effort normal and breath sounds normal.          Assessment & Plan:  We will start the series for Hepatitis A and B. Given Zpacks and Cipro.

## 2012-04-17 ENCOUNTER — Telehealth: Payer: Self-pay | Admitting: Family Medicine

## 2012-04-17 NOTE — Telephone Encounter (Signed)
Refill request for Cialis 10 mg take 1 po prn and last here on 04/06/12.

## 2012-04-20 MED ORDER — TADALAFIL 10 MG PO TABS: 10.0000 mg | ORAL_TABLET | Freq: Every day | ORAL | Status: DC | PRN

## 2012-04-20 NOTE — Telephone Encounter (Signed)
I sent script e-scribe. 

## 2012-04-20 NOTE — Telephone Encounter (Signed)
Call in #[phone] with 11 rf 

## 2012-04-28 ENCOUNTER — Ambulatory Visit: Payer: Medicare Other | Admitting: Family Medicine

## 2012-05-07 ENCOUNTER — Ambulatory Visit (INDEPENDENT_AMBULATORY_CARE_PROVIDER_SITE_OTHER): Payer: Medicare Other | Admitting: Family Medicine

## 2012-05-07 DIAGNOSIS — Z23 Encounter for immunization: Secondary | ICD-10-CM

## 2012-05-07 DIAGNOSIS — Z0000 Encounter for general adult medical examination without abnormal findings: Secondary | ICD-10-CM

## 2012-10-07 ENCOUNTER — Ambulatory Visit (INDEPENDENT_AMBULATORY_CARE_PROVIDER_SITE_OTHER): Payer: Medicare Other | Admitting: Family Medicine

## 2012-10-07 DIAGNOSIS — Z23 Encounter for immunization: Secondary | ICD-10-CM

## 2012-10-17 ENCOUNTER — Other Ambulatory Visit: Payer: Self-pay | Admitting: Family Medicine

## 2012-10-20 ENCOUNTER — Telehealth: Payer: Self-pay | Admitting: Family Medicine

## 2012-10-20 MED ORDER — LOSARTAN POTASSIUM 100 MG PO TABS: 100.0000 mg | ORAL_TABLET | Freq: Every day | ORAL | Status: DC

## 2012-10-20 NOTE — Telephone Encounter (Signed)
Refill request for Losartan 100 mg and I did send script e-scribe.

## 2013-01-17 ENCOUNTER — Other Ambulatory Visit: Payer: Self-pay | Admitting: Family Medicine

## 2013-01-19 NOTE — Telephone Encounter (Signed)
Okay for one year on both meds

## 2013-01-19 NOTE — Telephone Encounter (Signed)
Can we refill this? 

## 2013-01-21 ENCOUNTER — Encounter: Payer: Self-pay | Admitting: Cardiovascular Disease

## 2013-01-21 ENCOUNTER — Ambulatory Visit (INDEPENDENT_AMBULATORY_CARE_PROVIDER_SITE_OTHER): Payer: Medicare Other | Admitting: Cardiovascular Disease

## 2013-01-21 VITALS — BP 124/70 | HR 69 | Ht 70.0 in | Wt 278.0 lb

## 2013-01-21 DIAGNOSIS — I350 Nonrheumatic aortic (valve) stenosis: Secondary | ICD-10-CM

## 2013-01-21 DIAGNOSIS — I359 Nonrheumatic aortic valve disorder, unspecified: Secondary | ICD-10-CM

## 2013-01-21 DIAGNOSIS — I2510 Atherosclerotic heart disease of native coronary artery without angina pectoris: Secondary | ICD-10-CM

## 2013-01-21 NOTE — Progress Notes (Signed)
History of Present Illness: 79 yo WM with a h/o HTN, HL and CAD s/p 4V CABG March 2007 who is here today for cardiac follow up. He has been followed in the past by Dr. Shelva Majestic and most recently by Dr. Gala Romney. He has not had a cardiac cath since his bypass in 2007. Carotid u/s in 10/10: 0-39% bilaterally. Had nuclear stress test 4/11 EF 54%. Inferior, inferoseptal and apical defect consistent with scar and possible soft tissue attenuation. No signficiant ischemia. Echo 5/12: Normal EF. Moderate Aortic valve stenosis. He was last seen in our office May 2013.  He is here today for follow up. He has been doing well. No CP or dyspnea. Compliant with medications. He exercises three days per week.  Primary Care Physician: Dr. Clent Ridges  Last Lipid Profile:Lipid Panel     Component Value Date/Time   CHOL 107 10/01/2011 0922   TRIG 92.0 10/01/2011 0922   HDL 30.60* 10/01/2011 0922   CHOLHDL 3 10/01/2011 0922   VLDL 18.4 10/01/2011 0922   LDLCALC 58 10/01/2011 4540     Past Medical History  Diagnosis Date  . Allergic     (sees Dr. Corinda Gubler)  . Hyperlipidemia   . CAD (coronary artery disease)     sees Dr. Gala Romney   . Myocardial infarction   . S/P CABG (coronary artery bypass graft) 2007     Coronary artery bypass grafting x4 with left internal(Edward Tyrone Sage, MD)  . Carotid bruit     carotid u/s 10/10: 0.39% bilaterally  . Hx of colonic polyps     (sees Dr. Marina Goodell)  . ED (erectile dysfunction)   . HTN (hypertension)   . Precancerous skin lesion     (sees Dr. Terri Piedra)  . Osteoarthritis     especially in left knee, (sees Dr. Dorene Grebe )  . Benign prostatic hypertrophy     (sees Dr. Chester Holstein  . Obesity   . Gout   . GERD (gastroesophageal reflux disease)     Past Surgical History  Procedure Laterality Date  . Coronary artery bypass graft  2007  . Colonoscopy  06/30/2008    per Dr. Marina Goodell, Repeat  in 5 years.  . Cataract extraction, bilateral    . Cardiac catheterization  10/29/2005  . Hammer  toe repairs bilaterally    . Repair of right thumb injury    . Bilateral subcutaneous mastectomies    . Knee arthroplasty  07/30/2011    Procedure: COMPUTER ASSISTED TOTAL KNEE ARTHROPLASTY;  Surgeon: Cammy Copa;  Location: [facility];  Service: Orthopedics;  Laterality: Left;  left total knee arthroplasty  . Replacement total knee bilateral  2012    Current Outpatient Prescriptions  Medication Sig Dispense Refill  . aspirin 325 MG tablet Take 325 mg by mouth daily.      [name] atorvastatin (LIPITOR) 20 MG tablet TAKE 1 TABLET ONCE DAILY.  90 tablet  3  . diclofenac (VOLTAREN) 75 MG EC tablet Take 75 mg by mouth 2 (two) times daily.      . fesoterodine (TOVIAZ) 8 MG TB24 Take 8 mg by mouth daily.      [name] loratadine (CLARITIN) 5 MG chewable tablet Chew 5 mg by mouth daily.      [name] losartan (COZAAR) 100 MG tablet TAKE 1 TABLET ONCE DAILY.  90 tablet  3  . niacin 500 MG tablet Take 500 mg by mouth every morning.       . Testosterone (ANDROGEL) 20.25 MG/1.25GM (1.62%) GEL Place 2  application onto the skin daily.       [name] darifenacin (ENABLEX) 15 MG 24 hr tablet Take 15 mg by mouth every morning.        . fesoterodine (TOVIAZ) 8 MG TB24 Take 8 mg by mouth daily.      . tadalafil (CIALIS) 10 MG tablet Take 1 tablet (10 mg total) by mouth daily as needed for erectile dysfunction.  10 tablet  11   No current facility-administered medications for this visit.    Allergies  Allergen Reactions  . Peanut-Containing Drug Products   . Prednisone     Constipation & "he could not sleep"  . Sulfonamide Derivatives     REACTION: anaphylaxis    History   Social History  . Marital Status: Married    Spouse Name: N/A    Number of Children: N/A  . Years of Education: N/A   Occupational History  . general contractor     builds malls   Social History Main Topics  . Smoking status: Former Smoker    Quit date: 08/26/1958  . Smokeless tobacco: Never Used  . Alcohol Use: 3.5 oz/week    7 drink(s) per  week  . Drug Use: No  . Sexually Active: Not on file   Other Topics Concern  . Not on file   Social History Narrative   FH of CAD, Male 1st degree relative less than age 64.    Family History  Problem Relation Age of Onset  . Heart attack Father 18  . Heart failure Mother 61    Review of Systems:  As stated in the HPI and otherwise negative.   BP 124/70  Pulse 69  Ht 5\' 10"  (1.778 m)  Wt 278 lb (126.1 kg)  BMI 39.89 kg/m2  Physical Examination: General: Well developed, well nourished, NAD HEENT: OP clear, mucus membranes moist SKIN: warm, dry. No rashes. Neuro: No focal deficits Musculoskeletal: Muscle strength 5/5 all ext Psychiatric: Mood and affect normal Neck: No JVD, no carotid bruits, no thyromegaly, no lymphadenopathy. Lungs:Clear bilaterally, no wheezes, rhonci, crackles Cardiovascular: Regular rate and rhythm. Loud systolic murmur. No gallops or rubs. Abdomen:Soft. Bowel sounds present. Non-tender.  Extremities: No lower extremity edema. Pulses are 2 + in the bilateral DP/PT.  EKG: NSR, rate 69 bpm. Old inferior and septal infarct.   Echo 01/28/12: Left ventricle: Inferobasal hypokinesis The cavity size was normal. Wall thickness was increased in a pattern of moderate LVH. Systolic function was normal. The estimated ejection fraction was in the range of 50% to 55%. Features are consistent with a pseudonormal left ventricular filling pattern, with concomitant abnormal relaxation and increased filling pressure (grade 2 diastolic dysfunction). - Aortic valve: There was moderate stenosis. Mild regurgitation. - Mitral valve: Calcified annulus. - Left atrium: The atrium was moderately dilated. - Right atrium: The atrium was mildly dilated. - Atrial septum: No defect or patent foramen ovale was identified.  Assessment and Plan:   1. CAD: s/p CABG. Stable. No changes. BP and lipids are at goal. He will ask Dr. Clent Ridges to check lipids and LFTs this summer at his  physical.   2. Aortic valve stenosis: Moderate stenosis by echo June 2013. Will repeat next month.

## 2013-01-21 NOTE — Patient Instructions (Addendum)

## 2013-02-01 ENCOUNTER — Ambulatory Visit (HOSPITAL_COMMUNITY): Payer: Medicare Other | Attending: Cardiology

## 2013-02-01 DIAGNOSIS — I350 Nonrheumatic aortic (valve) stenosis: Secondary | ICD-10-CM

## 2013-02-01 DIAGNOSIS — I2510 Atherosclerotic heart disease of native coronary artery without angina pectoris: Secondary | ICD-10-CM

## 2013-02-01 DIAGNOSIS — I359 Nonrheumatic aortic valve disorder, unspecified: Secondary | ICD-10-CM | POA: Insufficient documentation

## 2013-02-01 DIAGNOSIS — I10 Essential (primary) hypertension: Secondary | ICD-10-CM | POA: Insufficient documentation

## 2013-02-01 DIAGNOSIS — I252 Old myocardial infarction: Secondary | ICD-10-CM | POA: Insufficient documentation

## 2013-02-01 DIAGNOSIS — E785 Hyperlipidemia, unspecified: Secondary | ICD-10-CM | POA: Insufficient documentation

## 2013-02-01 DIAGNOSIS — Z87891 Personal history of nicotine dependence: Secondary | ICD-10-CM | POA: Insufficient documentation

## 2013-02-01 NOTE — Progress Notes (Signed)
Echocardiogram performed.  

## 2013-02-03 ENCOUNTER — Telehealth: Payer: Self-pay | Admitting: Cardiovascular Disease

## 2013-02-03 NOTE — Telephone Encounter (Signed)
Spoke with pt and reviewed echo results with him.  

## 2013-02-03 NOTE — Telephone Encounter (Signed)
New problem  Pt is returning your call about his test results.

## 2013-04-16 ENCOUNTER — Ambulatory Visit (INDEPENDENT_AMBULATORY_CARE_PROVIDER_SITE_OTHER): Payer: Medicare Other | Admitting: Family Medicine

## 2013-04-16 ENCOUNTER — Encounter: Payer: Self-pay | Admitting: Family Medicine

## 2013-04-16 VITALS — BP 120/60 | HR 94 | Temp 98.0°F | Wt 280.0 lb

## 2013-04-16 DIAGNOSIS — J019 Acute sinusitis, unspecified: Secondary | ICD-10-CM

## 2013-04-16 MED ORDER — AMOXICILLIN-POT CLAVULANATE 875-125 MG PO TABS: 1.0000 | ORAL_TABLET | Freq: Two times a day (BID) | ORAL | Status: DC

## 2013-04-16 MED ORDER — METHYLPREDNISOLONE ACETATE 80 MG/ML IJ SUSP
120.0000 mg | Freq: Once | INTRAMUSCULAR | Status: AC
  Administered 2013-04-16: 120 mg via INTRAMUSCULAR

## 2013-04-16 NOTE — Progress Notes (Signed)
  Subjective:    Patient ID: Justin [name], male    DOB: 14-Dec-1933, 79 y.o.   MRN: [id number]  HPI Here for one week of sinus pressure , PND, ST, and a dry cough.    Review of Systems  Constitutional: Negative.   HENT: Positive for congestion, postnasal drip and sinus pressure.   Eyes: Negative.   Respiratory: Positive for cough.        Objective:   Physical Exam  Constitutional: He appears well-developed and well-nourished.  HENT:  Right Ear: External ear normal.  Left Ear: External ear normal.  Nose: Nose normal.  Mouth/Throat: Oropharynx is clear and moist.  Eyes: Conjunctivae are normal.  Pulmonary/Chest: Effort normal and breath sounds normal.  Lymphadenopathy:    He has no cervical adenopathy.          Assessment & Plan:  Add Mucinex

## 2013-04-16 NOTE — Addendum Note (Signed)
Addended by: Verdie Shire on: 04/16/2013 04:37 PM   Modules accepted: Orders

## 2013-05-17 ENCOUNTER — Encounter: Payer: Self-pay | Admitting: Internal Medicine

## 2013-10-27 ENCOUNTER — Encounter: Payer: Self-pay | Admitting: Family Medicine

## 2013-10-27 ENCOUNTER — Ambulatory Visit (INDEPENDENT_AMBULATORY_CARE_PROVIDER_SITE_OTHER): Payer: Medicare Other | Admitting: Family Medicine

## 2013-10-27 VITALS — BP 120/64 | HR 82 | Temp 97.9°F | Ht 70.0 in | Wt 270.0 lb

## 2013-10-27 DIAGNOSIS — J209 Acute bronchitis, unspecified: Secondary | ICD-10-CM

## 2013-10-27 MED ORDER — SILDENAFIL CITRATE 100 MG PO TABS: 100.0000 mg | ORAL_TABLET | ORAL | Status: DC | PRN

## 2013-10-27 MED ORDER — AZITHROMYCIN 250 MG PO TABS: ORAL_TABLET | ORAL | Status: DC

## 2013-10-27 MED ORDER — METHYLPREDNISOLONE ACETATE 80 MG/ML IJ SUSP
120.0000 mg | Freq: Once | INTRAMUSCULAR | Status: AC
  Administered 2013-10-27: 120 mg via INTRAMUSCULAR

## 2013-10-27 NOTE — Progress Notes (Signed)
   Subjective:    Patient ID: Justin [name], male    DOB: 12-19-1933, 80 y.o.   MRN: [id number]  HPI Here for 2 weeks of chest tightness and coughing up green sputum.    Review of Systems  Constitutional: Negative.   HENT: Positive for congestion. Negative for sinus pressure.   Eyes: Negative.   Respiratory: Positive for cough and chest tightness. Negative for wheezing.        Objective:   Physical Exam  Constitutional: He appears well-developed and well-nourished.  HENT:  Right Ear: External ear normal.  Left Ear: External ear normal.  Nose: Nose normal.  Mouth/Throat: Oropharynx is clear and moist.  Eyes: Conjunctivae are normal.  Pulmonary/Chest: Effort normal and breath sounds normal. No respiratory distress. He has no wheezes. He has no rales.  Lymphadenopathy:    He has no cervical adenopathy.          Assessment & Plan:  Add Mucinex

## 2013-10-27 NOTE — Progress Notes (Signed)
Pre visit review using our clinic review tool, if applicable. No additional management support is needed unless otherwise documented below in the visit note. 

## 2013-10-27 NOTE — Addendum Note (Signed)
Addended by: Aggie Hacker A on: 10/27/2013 12:12 PM   Modules accepted: Orders

## 2013-11-02 ENCOUNTER — Telehealth: Payer: Self-pay | Admitting: Family Medicine

## 2013-11-02 MED ORDER — HYDROCODONE-HOMATROPINE 5-1.5 MG/5ML PO SYRP: 5.0000 mL | ORAL_SOLUTION | ORAL | Status: DC | PRN

## 2013-11-02 NOTE — Telephone Encounter (Signed)
Patient Information:  Caller Name: [place]  Phone: [phone]  Patient: Justin [name], Justin [name]  Gender: Male  DOB: 1933-09-06  Age: 80 Years  PCP: [name])  Office Follow Up:  Does the office need to follow up with this patient?: Yes  Instructions For The Office: Rx request  RN Note:  Treated for bronchitis a week ago wtih a zpack 10/26/13 but cough continues.  States he finished the antibiotic, and does feel better, but states the cough is disruptive to sleep and worse throughout the day.  Wants cough suppressant called in, because "my ribs hurt."  States does not want codeine but would like something stronger than OTC cough syrup.  States is leaving for a conference in [place] 11/03/13.  Denies difficulty breathing, wheezing, or other emergent symptoms per cough protocol; sputum is clear and cough is essentially dry.  Declines appt; would like MD to consider Rx for cough.  Uses [employer] [facility] [employer].  Info to office for provider review/Rx/callback.  May reach patient at [phone].  krs/can  Symptoms  Reason For Call & Symptoms: cough  Reviewed Health History In EMR: Yes  Reviewed Medications In EMR: Yes  Reviewed Allergies In EMR: Yes  Reviewed Surgeries / Procedures: Yes  Date of Onset of Symptoms: 10/05/2013  Guideline(s) Used:  Cough  Disposition Per Guideline:   Home Care  Reason For Disposition Reached:   Cough with no complications  Advice Given:  N/A  Patient Will Follow Care Advice:  YES

## 2013-11-02 NOTE — Telephone Encounter (Signed)
Script is ready for pick up and I spoke with pt.  

## 2013-11-02 NOTE — Telephone Encounter (Signed)
rx is ready to pick up  

## 2013-11-02 NOTE — Telephone Encounter (Signed)
Patient had called, asked for call back concerning cough, congestion. Attempted to reach patient twice, left message for patient to call office back if still needing to speak with [place].

## 2013-11-02 NOTE — Telephone Encounter (Signed)
Call back attempt with no answer, voice mail left.

## 2013-11-03 ENCOUNTER — Other Ambulatory Visit (HOSPITAL_COMMUNITY): Payer: Self-pay | Admitting: Urology

## 2013-11-03 DIAGNOSIS — N402 Nodular prostate without lower urinary tract symptoms: Secondary | ICD-10-CM

## 2013-11-12 ENCOUNTER — Ambulatory Visit (INDEPENDENT_AMBULATORY_CARE_PROVIDER_SITE_OTHER)
Admission: RE | Admit: 2013-11-12 | Discharge: 2013-11-12 | Disposition: A | Discharge status: Home / Self Care | Payer: Medicare Other | Source: Ambulatory Visit | Attending: Family Medicine | Admitting: Family Medicine

## 2013-11-12 ENCOUNTER — Ambulatory Visit (INDEPENDENT_AMBULATORY_CARE_PROVIDER_SITE_OTHER): Payer: Medicare Other | Admitting: Family Medicine

## 2013-11-12 ENCOUNTER — Encounter: Payer: Self-pay | Admitting: Family Medicine

## 2013-11-12 VITALS — BP 120/68 | HR 84 | Temp 98.5°F | Wt 268.0 lb

## 2013-11-12 DIAGNOSIS — R05 Cough: Secondary | ICD-10-CM

## 2013-11-12 DIAGNOSIS — J209 Acute bronchitis, unspecified: Secondary | ICD-10-CM

## 2013-11-12 DIAGNOSIS — R059 Cough, unspecified: Secondary | ICD-10-CM

## 2013-11-12 MED ORDER — HYDROCODONE-HOMATROPINE 5-1.5 MG/5ML PO SYRP: 5.0000 mL | ORAL_SOLUTION | ORAL | Status: DC | PRN

## 2013-11-12 MED ORDER — LEVOFLOXACIN 500 MG PO TABS: 500.0000 mg | ORAL_TABLET | Freq: Every day | ORAL | Status: AC

## 2013-11-12 NOTE — Progress Notes (Signed)
   Subjective:    Patient ID: Justin [name], male    DOB: 1934/02/24, 80 y.o.   MRN: [id number]  HPI Here for continued dry coughing over the past 5 weeks. No fever or chest pains. He was here on 10-27-13 and was given a Zpack. This has not helped.    Review of Systems  Constitutional: Negative.   HENT: Negative.   Eyes: Negative.   Respiratory: Positive for cough. Negative for shortness of breath and wheezing.   Cardiovascular: Negative.        Objective:   Physical Exam  Constitutional: He appears well-developed and well-nourished. No distress.  HENT:  Right Ear: External ear normal.  Left Ear: External ear normal.  Nose: Nose normal.  Mouth/Throat: Oropharynx is clear and moist.  Eyes: Conjunctivae are normal.  Cardiovascular: Normal rate, regular rhythm, normal heart sounds and intact distal pulses.   Pulmonary/Chest: Effort normal and breath sounds normal. No respiratory distress. He has no wheezes. He has no rales.  Lymphadenopathy:    He has no cervical adenopathy.          Assessment & Plan:  Partially treated bronchitis. Try Levaquin. Get a CXR today

## 2013-11-24 ENCOUNTER — Ambulatory Visit (HOSPITAL_COMMUNITY)
Admission: RE | Admit: 2013-11-24 | Discharge: 2013-11-24 | Disposition: A | Discharge status: Home / Self Care | Payer: Medicare Other | Source: Ambulatory Visit | Attending: Urology | Admitting: Urology

## 2013-11-24 DIAGNOSIS — K5730 Diverticulosis of large intestine without perforation or abscess without bleeding: Secondary | ICD-10-CM | POA: Insufficient documentation

## 2013-11-24 DIAGNOSIS — N402 Nodular prostate without lower urinary tract symptoms: Secondary | ICD-10-CM | POA: Insufficient documentation

## 2013-11-24 DIAGNOSIS — N400 Enlarged prostate without lower urinary tract symptoms: Secondary | ICD-10-CM | POA: Insufficient documentation

## 2013-11-24 LAB — POCT I-STAT, CHEM 8
BUN: 12 mg/dL (ref 6–23)
CHLORIDE: 104 meq/L (ref 96–112)
Calcium, Ion: 1.04 mmol/L — ABNORMAL LOW (ref 1.13–1.30)
Creatinine, Ser: 0.8 mg/dL (ref 0.50–1.35)
Glucose, Bld: 102 mg/dL — ABNORMAL HIGH (ref 70–99)
HEMATOCRIT: 46 % (ref 39.0–52.0)
Hemoglobin: 15.6 g/dL (ref 13.0–17.0)
Potassium: 4 mEq/L (ref 3.7–5.3)
Sodium: 141 mEq/L (ref 137–147)
TCO2: 26 mmol/L (ref 0–100)

## 2013-11-24 MED ORDER — GADOBENATE DIMEGLUMINE 529 MG/ML IV SOLN
20.0000 mL | Freq: Once | INTRAVENOUS | Status: AC | PRN
  Administered 2013-11-24: 20 mL via INTRAVENOUS

## 2013-12-10 ENCOUNTER — Telehealth: Payer: Self-pay | Admitting: Family Medicine

## 2013-12-10 MED ORDER — BENZONATATE 200 MG PO CAPS: 200.0000 mg | ORAL_CAPSULE | Freq: Two times a day (BID) | ORAL | Status: DC | PRN

## 2013-12-10 MED ORDER — DOXYCYCLINE HYCLATE 100 MG PO TABS: 100.0000 mg | ORAL_TABLET | Freq: Two times a day (BID) | ORAL | Status: DC

## 2013-12-10 NOTE — Telephone Encounter (Signed)
Pt said he has a persistent cough and would like to know if Dr Sarajane Jews would call him something

## 2013-12-10 NOTE — Telephone Encounter (Signed)
Call in Doxycycline 100 mg bid for 10 days, also Benzonatate 200 mg capsules to take bid prn cough, #60 with no rf.

## 2013-12-10 NOTE — Telephone Encounter (Signed)
I sent both scripts e-scribe and spoke with pt. 

## 2013-12-24 ENCOUNTER — Ambulatory Visit (INDEPENDENT_AMBULATORY_CARE_PROVIDER_SITE_OTHER): Payer: 59

## 2013-12-24 VITALS — BP 163/74 | HR 96 | Resp 12

## 2013-12-24 DIAGNOSIS — M722 Plantar fascial fibromatosis: Secondary | ICD-10-CM

## 2013-12-24 DIAGNOSIS — M1990 Unspecified osteoarthritis, unspecified site: Secondary | ICD-10-CM

## 2013-12-24 DIAGNOSIS — M79609 Pain in unspecified limb: Secondary | ICD-10-CM

## 2013-12-24 DIAGNOSIS — M779 Enthesopathy, unspecified: Secondary | ICD-10-CM

## 2013-12-24 DIAGNOSIS — B351 Tinea unguium: Secondary | ICD-10-CM

## 2013-12-24 NOTE — Progress Notes (Signed)
   Subjective:    Patient ID: Justin [name], male    DOB: 01-Mar-1934, 80 y.o.   MRN: [id number]  HPI PT STATED TOENAILS ARE HARD TO TRIM AND NEED TO BE TRIM IF POSSIBLE. ALSO, HAVE OLD ORTHOTICS AND NEED NEW 4 PAIR OF ORTHOTICS.    Review of Systems  Constitutional: Negative.   HENT: Positive for congestion.   Eyes: Negative.   Respiratory: Negative.   Cardiovascular: Negative.   Gastrointestinal: Negative.   Endocrine: Negative.   Genitourinary: Negative.   Musculoskeletal: Positive for arthralgias.  Skin: Negative.   Allergic/Immunologic: Negative.   Neurological: Negative.   Hematological: Negative.   Psychiatric/Behavioral: Negative.        Objective:   Physical Exam Lower extremity objective findings as follows vascular status is intact. Pedal pulses palpable DP and PT posterior were for Refill timed 3-4 seconds skin temperature warm turgor normal no edema rubor pallor or varicosities noted mild varicosities distally nails thick criptotic incurvated friable 1 through 5 bilateral patient is in the past and self-care having difficulty with thickness and his vision shoes. Neurologically epicritic and proprioceptive sensations intact and symmetric he has a history of previous hammertoe surgeries with rigid digital contractures still has arthropathy the forefoot well-healed digital scars are noted orthopedic biomechanical exam unremarkable for mild residual arthrosis the digits MTP joints patient wearing functional orthoses with met pads for several years now and needs replacement at this time as the previous pairs are extremely worn and broken down. Orthopedic biomechanical exam otherwise unremarkable no x-rays taken at this time is a history of plantar fascial symptomology management the orthoses as well as capsulitis of the forefoot managed by orthotic use.       Assessment & Plan:  Assessment this time his plantar fasciitis and capsulitis forefoot managed with orthoses  patient is incision for new set of orthotics that could as individual shoes he understands me now in approximately covered by his insurance and is still wants the total number of 4 orthotics pairs. Patient also requesting nail care dystrophic friable mycotic nails debrided 1 through 5 bilateral return for future palliative care is needed patient will be packed with the next 3-4 weeks for orthotic pickup and fitting orthotics skin carried out at this time a new functional orthotics will be ordered with [employer] top cover and per patient request and met pads to ask  Harriet Masson DPM

## 2013-12-24 NOTE — Patient Instructions (Signed)

## 2014-01-11 ENCOUNTER — Telehealth: Payer: Self-pay | Admitting: *Deleted

## 2014-01-11 NOTE — Telephone Encounter (Signed)
I left the patient a message that his orthotics are not here yet.  Hopefully they'll be here for his scheduled appointment on the 22nd.  Please call with any further questions are concerns.

## 2014-01-11 NOTE — Telephone Encounter (Signed)
Message copied by Lolita Rieger on Tue Jan 11, 2014  3:55 PM ------      Message from: Azell Der      Created: Mon Jan 10, 2014  2:29 PM      Regarding: ORTHOTIC'S      Contact: [phone]       Are  Ortho's in? ------

## 2014-01-14 ENCOUNTER — Other Ambulatory Visit: Payer: 59

## 2014-01-14 ENCOUNTER — Ambulatory Visit (INDEPENDENT_AMBULATORY_CARE_PROVIDER_SITE_OTHER): Payer: 59 | Admitting: *Deleted

## 2014-01-14 DIAGNOSIS — M779 Enthesopathy, unspecified: Secondary | ICD-10-CM

## 2014-01-14 NOTE — Progress Notes (Signed)
   Subjective:    Patient ID: Justin [name], male    DOB: 19-May-1934, 80 y.o.   MRN: [id number]  HPI PICK UP ORTHOTICS AND GIVEN INSTRUCTION.    Review of Systems     Objective:   Physical Exam        Assessment & Plan:

## 2014-01-14 NOTE — Progress Notes (Signed)
Dispensed patient's orthotics with oral and written instructions for wearing. Patient will follow up with Dr. Blenda Mounts in 6 weeks for an orthotic check.

## 2014-01-14 NOTE — Patient Instructions (Signed)

## 2014-01-20 ENCOUNTER — Ambulatory Visit (INDEPENDENT_AMBULATORY_CARE_PROVIDER_SITE_OTHER): Payer: Medicare Other | Admitting: Cardiovascular Disease

## 2014-01-20 ENCOUNTER — Encounter: Payer: Self-pay | Admitting: Cardiovascular Disease

## 2014-01-20 VITALS — BP 130/62 | HR 69 | Ht 70.0 in | Wt 274.0 lb

## 2014-01-20 DIAGNOSIS — I2510 Atherosclerotic heart disease of native coronary artery without angina pectoris: Secondary | ICD-10-CM

## 2014-01-20 DIAGNOSIS — I359 Nonrheumatic aortic valve disorder, unspecified: Secondary | ICD-10-CM

## 2014-01-20 DIAGNOSIS — I350 Nonrheumatic aortic (valve) stenosis: Secondary | ICD-10-CM

## 2014-01-20 DIAGNOSIS — I10 Essential (primary) hypertension: Secondary | ICD-10-CM

## 2014-01-20 DIAGNOSIS — E785 Hyperlipidemia, unspecified: Secondary | ICD-10-CM

## 2014-01-20 LAB — HEPATIC FUNCTION PANEL
ALBUMIN: 3.4 g/dL — AB (ref 3.5–5.2)
ALK PHOS: 64 U/L (ref 39–117)
ALT: 20 U/L (ref 0–53)
AST: 24 U/L (ref 0–37)
BILIRUBIN DIRECT: 0 mg/dL (ref 0.0–0.3)
TOTAL PROTEIN: 6.8 g/dL (ref 6.0–8.3)
Total Bilirubin: 0.6 mg/dL (ref 0.2–1.2)

## 2014-01-20 LAB — LIPID PANEL
CHOLESTEROL: 124 mg/dL (ref 0–200)
HDL: 36.8 mg/dL — ABNORMAL LOW (ref >39.00)
LDL CALC: 58 mg/dL (ref 0–99)
TRIGLYCERIDES: 148 mg/dL (ref 0.0–149.0)
Total CHOL/HDL Ratio: 3
VLDL: 29.6 mg/dL (ref 0.0–40.0)

## 2014-01-20 NOTE — Progress Notes (Signed)
History of Present Illness: 80 yo WM with a h/o HTN, HLD and CAD s/p 4V CABG March 2007 who is here today for cardiac follow up. He has been followed in the past by Dr. Rayford Halsted and then by Dr. Haroldine Laws. He has not had a cardiac cath since his bypass in 2007. Carotid u/s in 10/10: 0-39% bilaterally. Had nuclear stress test 4/11 EF 54%. Inferior, inferoseptal and apical defect consistent with scar and possible soft tissue attenuation. No signficiant ischemia. Echo June 2014 with normal EF. Moderate Aortic valve stenosis.  He is here today for follow up. He has been doing well. No CP or dyspnea. Compliant with medications. He exercises three days per week. He is still working as a Chief Strategy Officer.   Primary Care Physician: Dr. Sarajane Jews  Last Lipid Profile:Lipid Panel     Component Value Date/Time   CHOL 107 10/01/2011 0922   TRIG 92.0 10/01/2011 0922   HDL 30.60* 10/01/2011 0922   CHOLHDL 3 10/01/2011 0922   VLDL 18.4 10/01/2011 0922   LDLCALC 58 10/01/2011 3329     Past Medical History  Diagnosis Date  . Allergic     (sees Dr. Velora Heckler)  . Hyperlipidemia   . CAD (coronary artery disease)     sees Dr. Haroldine Laws   . Myocardial infarction   . S/P CABG (coronary artery bypass graft) 2007     Coronary artery bypass grafting x4 with left internal(Edward Servando Snare, MD)  . Carotid bruit     carotid u/s 10/10: 0.39% bilaterally  . Hx of colonic polyps     (sees Dr. Henrene Pastor)  . ED (erectile dysfunction)   . HTN (hypertension)   . Precancerous skin lesion     (sees Dr. Allyson Sabal)  . Osteoarthritis     especially in left knee, (sees Dr. Alphonzo Severance )  . Benign prostatic hypertrophy     (sees Dr. Denman George  . Obesity   . Gout   . GERD (gastroesophageal reflux disease)     Past Surgical History  Procedure Laterality Date  . Coronary artery bypass graft  2007  . Colonoscopy  06/30/2008    per Dr. Henrene Pastor, Repeat  in 5 years.  . Cataract extraction, bilateral    . Cardiac catheterization  10/29/2005  . Hammer  toe repairs bilaterally    . Repair of right thumb injury    . Bilateral subcutaneous mastectomies    . Knee arthroplasty  07/30/2011    Procedure: COMPUTER ASSISTED TOTAL KNEE ARTHROPLASTY;  Surgeon: Meredith Pel;  Location: [facility];  Service: Orthopedics;  Laterality: Left;  left total knee arthroplasty  . Replacement total knee bilateral  2012    Current Outpatient Prescriptions  Medication Sig Dispense Refill  . aspirin 325 MG tablet Take 325 mg by mouth daily.      [name] atorvastatin (LIPITOR) 20 MG tablet TAKE 1 TABLET ONCE DAILY.  90 tablet  3  . loratadine (CLARITIN) 10 MG tablet Take 10 mg by mouth 2 (two) times daily.      [name] losartan (COZAAR) 100 MG tablet TAKE 1 TABLET ONCE DAILY.  90 tablet  3  . Multiple Vitamins-Minerals (EYE VITAMINS PO) Take by mouth.      . sildenafil (VIAGRA) 100 MG tablet Take 1 tablet (100 mg total) by mouth as needed for erectile dysfunction.  10 tablet  11  . Testosterone (ANDROGEL) 20.25 MG/1.25GM (1.62%) GEL Place 2 application onto the skin daily.        No current  facility-administered medications for this visit.    Allergies  Allergen Reactions  . Peanut-Containing Drug Products   . Prednisone     Constipation & "he could not sleep"  . Sulfonamide Derivatives     REACTION: anaphylaxis    History   Social History  . Marital Status: Married    Spouse Name: N/A    Number of Children: N/A  . Years of Education: N/A   Occupational History  . general contractor     builds malls   Social History Main Topics  . Smoking status: Former Smoker    Quit date: 08/26/1958  . Smokeless tobacco: Never Used  . Alcohol Use: 3.5 oz/week    7 drink(s) per week  . Drug Use: No  . Sexual Activity: Not on file   Other Topics Concern  . Not on file   Social History Narrative   FH of CAD, Male 1st degree relative less than age 71.    Family History  Problem Relation Age of Onset  . Heart attack Father 69  . Heart failure Mother 50     Review of Systems:  As stated in the HPI and otherwise negative.   BP 130/62  Pulse 69  Ht 5\' 10"  (1.778 m)  Wt 274 lb (124.286 kg)  BMI 39.32 kg/m2  Physical Examination: General: Well developed, well nourished, NAD HEENT: OP clear, mucus membranes moist SKIN: warm, dry. No rashes. Neuro: No focal deficits Musculoskeletal: Muscle strength 5/5 all ext Psychiatric: Mood and affect normal Neck: No JVD, no carotid bruits, no thyromegaly, no lymphadenopathy. Lungs:Clear bilaterally, no wheezes, rhonci, crackles Cardiovascular: Regular rate and rhythm. Loud systolic murmur. No gallops or rubs. Abdomen:Soft. Bowel sounds present. Non-tender.  Extremities: No lower extremity edema. Pulses are 2 + in the bilateral DP/PT.  EKG: NSR, rate 69 bpm. Poor R wave progression precordial leads. Inferior Q-waves.   Echo 02/01/13: Left ventricle: The cavity size was normal. Wall thickness was increased in a pattern of moderate LVH. The estimated ejection fraction was 60%. Wall motion was normal; there were no regional wall motion abnormalities. - Aortic valve: Moderately calcified leaflets. There was moderate to severe stenosis. Trivial regurgitation. Peak velocity: 371cm/s (S). Mean gradient: 65mm Hg (S). Peak gradient: 73mm Hg (S). - Mitral valve: Mildly calcified annulus. No significant regurgitation. - Left atrium: The atrium was moderately to severely dilated. - Right atrium: The atrium was mildly dilated.  Assessment and Plan:   1. CAD: s/p CABG. Stable. No changes.  2. Aortic valve stenosis: Moderate stenosis by echo June 2014. Repeat echo now. We discussed AVR and TAVR when needed. He is asymptomatic.   3. HTN: BP controlled. No changes.   4. HLD: Continue statin. Check lipids and LFTs today.

## 2014-01-20 NOTE — Patient Instructions (Signed)

## 2014-01-21 ENCOUNTER — Ambulatory Visit: Payer: Medicare Other | Admitting: Cardiovascular Disease

## 2014-01-23 ENCOUNTER — Other Ambulatory Visit: Payer: Self-pay | Admitting: Family Medicine

## 2014-01-26 ENCOUNTER — Ambulatory Visit (HOSPITAL_COMMUNITY)
Admission: RE | Admit: 2014-01-26 | Discharge: 2014-01-26 | Disposition: A | Discharge status: Home / Self Care | Payer: Medicare Other | Source: Ambulatory Visit | Attending: Cardiovascular Disease | Admitting: Cardiovascular Disease

## 2014-01-26 DIAGNOSIS — I359 Nonrheumatic aortic valve disorder, unspecified: Secondary | ICD-10-CM

## 2014-01-26 DIAGNOSIS — I350 Nonrheumatic aortic (valve) stenosis: Secondary | ICD-10-CM

## 2014-01-26 NOTE — Progress Notes (Signed)
2D Echo Performed 01/26/2014    Yeng Perz, RCS  

## 2014-02-15 DIAGNOSIS — M722 Plantar fascial fibromatosis: Secondary | ICD-10-CM

## 2014-03-04 ENCOUNTER — Ambulatory Visit: Payer: 59

## 2014-03-11 ENCOUNTER — Encounter: Payer: Self-pay | Admitting: Family Medicine

## 2014-03-11 ENCOUNTER — Ambulatory Visit (INDEPENDENT_AMBULATORY_CARE_PROVIDER_SITE_OTHER): Payer: Medicare Other | Admitting: Family Medicine

## 2014-03-11 VITALS — BP 135/69 | HR 82 | Temp 98.8°F | Ht 70.0 in | Wt 273.0 lb

## 2014-03-11 DIAGNOSIS — J209 Acute bronchitis, unspecified: Secondary | ICD-10-CM

## 2014-03-11 MED ORDER — HYDROCODONE-HOMATROPINE 5-1.5 MG/5ML PO SYRP: 5.0000 mL | ORAL_SOLUTION | ORAL | Status: DC | PRN

## 2014-03-11 MED ORDER — LEVOFLOXACIN 500 MG PO TABS: 500.0000 mg | ORAL_TABLET | Freq: Every day | ORAL | Status: AC

## 2014-03-11 MED ORDER — BENZONATATE 200 MG PO CAPS: 200.0000 mg | ORAL_CAPSULE | Freq: Two times a day (BID) | ORAL | Status: DC | PRN

## 2014-03-11 MED ORDER — METHYLPREDNISOLONE ACETATE 40 MG/ML IJ SUSP
120.0000 mg | Freq: Once | INTRAMUSCULAR | Status: AC
  Administered 2014-03-11: 120 mg via INTRAMUSCULAR

## 2014-03-11 NOTE — Progress Notes (Signed)
Pre visit review using our clinic review tool, if applicable. No additional management support is needed unless otherwise documented below in the visit note. 

## 2014-03-13 ENCOUNTER — Encounter: Payer: Self-pay | Admitting: Family Medicine

## 2014-03-13 NOTE — Progress Notes (Signed)
   Subjective:    Patient ID: Justin [name], male    DOB: July 17, 1934, 80 y.o.   MRN: [id number]  HPI Here for 4 days of chest tightness, PND,and coughing up yellow sputum. No fever.    Review of Systems  Constitutional: Negative.   HENT: Positive for congestion and postnasal drip. Negative for sinus pressure.   Eyes: Negative.   Respiratory: Positive for cough and chest tightness. Negative for shortness of breath and wheezing.        Objective:   Physical Exam  Constitutional: He appears well-developed and well-nourished.  HENT:  Right Ear: External ear normal.  Left Ear: External ear normal.  Nose: Nose normal.  Mouth/Throat: Oropharynx is clear and moist.  Eyes: Conjunctivae are normal.  Pulmonary/Chest: Effort normal. No respiratory distress. He has no wheezes. He has no rales.  Scattered rhonchi  Lymphadenopathy:    He has no cervical adenopathy.          Assessment & Plan:  Given a shot of steroids and started on Levaquin.

## 2014-04-17 ENCOUNTER — Other Ambulatory Visit: Payer: Self-pay | Admitting: Family Medicine

## 2014-05-10 ENCOUNTER — Telehealth: Payer: Self-pay | Admitting: Family Medicine

## 2014-05-10 NOTE — Telephone Encounter (Signed)
This may be related to silent GERD (even if he does not have heartburn), so try Nexium OTC to take 2 a day (total of 40 mg ) each morning for several weeks, then follow up

## 2014-05-10 NOTE — Telephone Encounter (Signed)
Pt states he has had that same persistent cough for several months. Pt was given benzonatate (TESSALON) 200 MG capsule 2 x /day  Pt states it has gotten worse lately.  Pt doesn't think he needs to come in, but would like to know what you would recommend.at this point. [place]

## 2014-05-11 NOTE — Telephone Encounter (Signed)
Called and spoke with pt and pt is aware.  

## 2014-05-31 ENCOUNTER — Other Ambulatory Visit: Payer: Self-pay | Admitting: Allergy and Immunology

## 2014-05-31 ENCOUNTER — Ambulatory Visit
Admission: RE | Admit: 2014-05-31 | Discharge: 2014-05-31 | Disposition: A | Discharge status: Home / Self Care | Payer: Medicare Other | Source: Ambulatory Visit | Attending: Allergy and Immunology | Admitting: Allergy and Immunology

## 2014-05-31 DIAGNOSIS — R059 Cough, unspecified: Secondary | ICD-10-CM

## 2014-05-31 DIAGNOSIS — R05 Cough: Secondary | ICD-10-CM

## 2014-07-14 ENCOUNTER — Other Ambulatory Visit: Payer: Self-pay | Admitting: Family Medicine

## 2014-07-14 NOTE — Telephone Encounter (Signed)
Per Dr. Sarajane Jews, okay to refill the Tessalon capsules, which I did.

## 2014-07-25 ENCOUNTER — Encounter: Payer: Self-pay | Admitting: Family Medicine

## 2014-07-25 ENCOUNTER — Ambulatory Visit (INDEPENDENT_AMBULATORY_CARE_PROVIDER_SITE_OTHER): Payer: Medicare Other | Admitting: Family Medicine

## 2014-07-25 VITALS — BP 140/73 | HR 76 | Temp 97.8°F | Ht 70.0 in | Wt 282.0 lb

## 2014-07-25 DIAGNOSIS — I10 Essential (primary) hypertension: Secondary | ICD-10-CM

## 2014-07-25 DIAGNOSIS — R053 Chronic cough: Secondary | ICD-10-CM

## 2014-07-25 DIAGNOSIS — R05 Cough: Secondary | ICD-10-CM

## 2014-07-25 MED ORDER — AZITHROMYCIN 250 MG PO TABS: ORAL_TABLET | ORAL | Status: DC

## 2014-07-25 MED ORDER — AMLODIPINE BESYLATE 10 MG PO TABS: 10.0000 mg | ORAL_TABLET | Freq: Every day | ORAL | Status: DC

## 2014-07-25 NOTE — Progress Notes (Signed)
Pre visit review using our clinic review tool, if applicable. No additional management support is needed unless otherwise documented below in the visit note. 

## 2014-07-25 NOTE — Progress Notes (Signed)
   Subjective:    Patient ID: Justin [name], male    DOB: Jul 11, 1934, 80 y.o.   MRN: [id number]  HPI Here to follow up on chronic cough. He has seen Dr. Harold Hedge and he feels there is no allergy problem. He asked Justin [name] to try Prilosec, which he did for one month, but this did not help. We switched him from Lisinopril to Losartan last year with no effect.    Review of Systems  Constitutional: Negative.   Respiratory: Positive for cough. Negative for chest tightness, shortness of breath and wheezing.   Cardiovascular: Negative.        Objective:   Physical Exam  Constitutional: He appears well-developed and well-nourished.  Cardiovascular: Normal rate, regular rhythm, normal heart sounds and intact distal pulses.   Pulmonary/Chest: Effort normal and breath sounds normal.          Assessment & Plan:  We will stop his ARB as well, and we will switch from Losartan to Amlodipine 10 mg daily. Recheck one month

## 2014-07-26 ENCOUNTER — Telehealth: Payer: Self-pay | Admitting: Family Medicine

## 2014-07-26 NOTE — Telephone Encounter (Signed)
emmi emailed °

## 2014-08-22 ENCOUNTER — Telehealth: Payer: Self-pay | Admitting: Family Medicine

## 2014-08-22 NOTE — Telephone Encounter (Signed)
Pt's daughter stopped by with a detailed letter explaining that pt has had some swelling in both ankles since starting on Amlodipine. ( please see letter ). I asked Dr. Regis Bill to give some advice, just until Dr. Sarajane Jews gets back in the office. Per Panosh advice pt to take only half of the Amlodipine for today and tomorrow. I spoke with daughter [name] and gave this advice. We will call her back with further advice once Dr. Sarajane Jews reviews notes.

## 2014-08-23 MED ORDER — METOPROLOL TARTRATE 50 MG PO TABS: 50.0000 mg | ORAL_TABLET | Freq: Every day | ORAL | Status: DC

## 2014-08-23 NOTE — Telephone Encounter (Signed)
I spoke with [name], pt's daughter, sent new script e-scribe and added Amlodipine to drug allergy list.

## 2014-08-23 NOTE — Telephone Encounter (Signed)
Tell him to stop the Amlodipine completely. Stay on a total of 40 mg a day of Lasix for now. After he gets back to [place], start on Metoprolol 50 mg daily. Call in #30 with 2 rf. Have him see me one week after that.

## 2014-08-26 DIAGNOSIS — R6889 Other general symptoms and signs: Secondary | ICD-10-CM | POA: Diagnosis not present

## 2014-08-29 DIAGNOSIS — R05 Cough: Secondary | ICD-10-CM | POA: Diagnosis not present

## 2014-08-29 DIAGNOSIS — R6889 Other general symptoms and signs: Secondary | ICD-10-CM | POA: Diagnosis not present

## 2014-08-31 ENCOUNTER — Other Ambulatory Visit: Payer: Medicare Other

## 2014-08-31 DIAGNOSIS — C61 Malignant neoplasm of prostate: Secondary | ICD-10-CM | POA: Diagnosis not present

## 2014-09-01 ENCOUNTER — Ambulatory Visit (INDEPENDENT_AMBULATORY_CARE_PROVIDER_SITE_OTHER): Payer: Medicare Other | Admitting: Family Medicine

## 2014-09-01 ENCOUNTER — Encounter: Payer: Self-pay | Admitting: Family Medicine

## 2014-09-01 ENCOUNTER — Other Ambulatory Visit (INDEPENDENT_AMBULATORY_CARE_PROVIDER_SITE_OTHER): Payer: Medicare Other

## 2014-09-01 VITALS — BP 148/85 | HR 81 | Temp 97.5°F | Ht 70.0 in | Wt 285.0 lb

## 2014-09-01 DIAGNOSIS — R05 Cough: Secondary | ICD-10-CM | POA: Diagnosis not present

## 2014-09-01 DIAGNOSIS — I10 Essential (primary) hypertension: Secondary | ICD-10-CM

## 2014-09-01 DIAGNOSIS — R600 Localized edema: Secondary | ICD-10-CM | POA: Diagnosis not present

## 2014-09-01 DIAGNOSIS — E785 Hyperlipidemia, unspecified: Secondary | ICD-10-CM

## 2014-09-01 DIAGNOSIS — R053 Chronic cough: Secondary | ICD-10-CM

## 2014-09-01 DIAGNOSIS — N401 Enlarged prostate with lower urinary tract symptoms: Secondary | ICD-10-CM | POA: Diagnosis not present

## 2014-09-01 DIAGNOSIS — Z0000 Encounter for general adult medical examination without abnormal findings: Secondary | ICD-10-CM | POA: Diagnosis not present

## 2014-09-01 DIAGNOSIS — C61 Malignant neoplasm of prostate: Secondary | ICD-10-CM | POA: Diagnosis not present

## 2014-09-01 DIAGNOSIS — J4520 Mild intermittent asthma, uncomplicated: Secondary | ICD-10-CM | POA: Insufficient documentation

## 2014-09-01 LAB — LIPID PANEL
CHOLESTEROL: 105 mg/dL (ref 0–200)
HDL: 24.1 mg/dL — AB (ref >39.00)
LDL Cholesterol: 57 mg/dL (ref 0–99)
NonHDL: 80.9
TRIGLYCERIDES: 119 mg/dL (ref 0.0–149.0)
Total CHOL/HDL Ratio: 4
VLDL: 23.8 mg/dL (ref 0.0–40.0)

## 2014-09-01 LAB — CBC WITH DIFFERENTIAL/PLATELET
BASOS ABS: 0.1 10*3/uL (ref 0.0–0.1)
BASOS PCT: 0.5 % (ref 0.0–3.0)
EOS PCT: 3.5 % (ref 0.0–5.0)
Eosinophils Absolute: 0.4 10*3/uL (ref 0.0–0.7)
HEMATOCRIT: 40 % (ref 39.0–52.0)
Hemoglobin: 12.8 g/dL — ABNORMAL LOW (ref 13.0–17.0)
LYMPHS ABS: 2.2 10*3/uL (ref 0.7–4.0)
Lymphocytes Relative: 19.5 % (ref 12.0–46.0)
MCHC: 32 g/dL (ref 30.0–36.0)
MCV: 85.6 fl (ref 78.0–100.0)
Monocytes Absolute: 0.9 10*3/uL (ref 0.1–1.0)
Monocytes Relative: 8 % (ref 3.0–12.0)
Neutro Abs: 7.7 10*3/uL (ref 1.4–7.7)
Neutrophils Relative %: 68.5 % (ref 43.0–77.0)
Platelets: 248 10*3/uL (ref 150.0–400.0)
RBC: 4.68 Mil/uL (ref 4.22–5.81)
RDW: 15.8 % — ABNORMAL HIGH (ref 11.5–15.5)
WBC: 11.3 10*3/uL — AB (ref 4.0–10.5)

## 2014-09-01 LAB — BASIC METABOLIC PANEL
BUN: 9 mg/dL (ref 6–23)
CHLORIDE: 103 meq/L (ref 96–112)
CO2: 27 meq/L (ref 19–32)
CREATININE: 0.7 mg/dL (ref 0.4–1.5)
Calcium: 8.3 mg/dL — ABNORMAL LOW (ref 8.4–10.5)
GFR: 108.67 mL/min (ref >60.00)
GLUCOSE: 94 mg/dL (ref 70–99)
Potassium: 3.6 mEq/L (ref 3.5–5.1)
Sodium: 139 mEq/L (ref 135–145)

## 2014-09-01 LAB — HEPATIC FUNCTION PANEL
ALBUMIN: 3 g/dL — AB (ref 3.5–5.2)
ALT: 19 U/L (ref 0–53)
AST: 27 U/L (ref 0–37)
Alkaline Phosphatase: 64 U/L (ref 39–117)
Bilirubin, Direct: 0.2 mg/dL (ref 0.0–0.3)
TOTAL PROTEIN: 6.1 g/dL (ref 6.0–8.3)
Total Bilirubin: 1 mg/dL (ref 0.2–1.2)

## 2014-09-01 LAB — TSH: TSH: 5.67 u[IU]/mL — ABNORMAL HIGH (ref 0.35–4.50)

## 2014-09-01 LAB — PSA: PSA: 1.15 ng/mL (ref 0.10–4.00)

## 2014-09-01 LAB — POCT URINALYSIS DIPSTICK
Bilirubin, UA: NEGATIVE
Glucose, UA: NEGATIVE
Ketones, UA: NEGATIVE
Leukocytes, UA: NEGATIVE
NITRITE UA: NEGATIVE
Protein, UA: NEGATIVE
RBC UA: NEGATIVE
Spec Grav, UA: 1.015
Urobilinogen, UA: 0.2
pH, UA: 6

## 2014-09-01 NOTE — Progress Notes (Signed)
   Subjective:    Patient ID: Justin [name], male    DOB: 1933/08/30, 81 y.o.   MRN: [id number]  HPI Here for several issues. First he has had a chronic dry cough for the past 6 months, and no etiology has been found. He had a normal CXR on 05-31-14. He has been treated for possible GERD with PPIs and this has not helped. He has seen Dr. Harold Hedge for an allergy workup and this has been unrevealing. Also we have taken him off Lisinopril and then off Losartan thinking these meds could be a cause of the cough, but this has not helped either. At his visit with [place] last month we stopped his Losartan and started him on Amlodipine for his HTN. Unfortunately he immediately began swelling and by the fifth day he was on this he had massive LE edema. He was on a cruise at the time and the cruise doctor saw him. He stopped the Amlodipine and started him on Lasix 20 mg daily. This almost immediately reduced the swelling and he felt better. His BP was stable. Then the Lasix was increasedto 40 mg a day, and he began to feel very lethargic and SOB. It was determined that he was dehydrated and the Lasix was stopped 3 days ago. He is now back in town and his BP is slightly high. We had called in a rx for Metoprolol succinate 50 mg daily for him but he has not started this yet. Today he feels fine with no chest pain or SOB. His legs are still a bit puffy and he is wearing knee high support stockings.    Review of Systems  Constitutional: Negative.   Respiratory: Negative.   Cardiovascular: Positive for leg swelling. Negative for chest pain and palpitations.       Objective:   Physical Exam  Constitutional: He appears well-developed and well-nourished. No distress.  Cardiovascular: Normal rate, regular rhythm, normal heart sounds and intact distal pulses.   Pulmonary/Chest: Effort normal and breath sounds normal. No respiratory distress. He has no wheezes. He has no rales.  Musculoskeletal:  2+ edema to both  lower legs           Assessment & Plan:  Stay off lasix for now, continue the support stockings. Start on Metoprolol as above. Recheck the BP in 3 weeks. Refer to Pulmonary for the cough.

## 2014-09-01 NOTE — Progress Notes (Signed)
Pre visit review using our clinic review tool, if applicable. No additional management support is needed unless otherwise documented below in the visit note. 

## 2014-09-05 ENCOUNTER — Encounter: Payer: Self-pay | Admitting: Family Medicine

## 2014-09-05 ENCOUNTER — Ambulatory Visit (INDEPENDENT_AMBULATORY_CARE_PROVIDER_SITE_OTHER): Payer: Medicare Other | Admitting: Family Medicine

## 2014-09-05 VITALS — BP 137/76 | HR 72 | Temp 97.7°F | Ht 70.0 in | Wt 283.0 lb

## 2014-09-05 DIAGNOSIS — J3089 Other allergic rhinitis: Secondary | ICD-10-CM | POA: Diagnosis not present

## 2014-09-05 DIAGNOSIS — E038 Other specified hypothyroidism: Secondary | ICD-10-CM

## 2014-09-05 DIAGNOSIS — K219 Gastro-esophageal reflux disease without esophagitis: Secondary | ICD-10-CM | POA: Diagnosis not present

## 2014-09-05 DIAGNOSIS — Z0000 Encounter for general adult medical examination without abnormal findings: Secondary | ICD-10-CM | POA: Diagnosis not present

## 2014-09-05 DIAGNOSIS — J301 Allergic rhinitis due to pollen: Secondary | ICD-10-CM | POA: Diagnosis not present

## 2014-09-05 DIAGNOSIS — R05 Cough: Secondary | ICD-10-CM | POA: Diagnosis not present

## 2014-09-05 LAB — T4, FREE: Free T4: 0.84 ng/dL (ref 0.60–1.60)

## 2014-09-05 LAB — T3, FREE: T3, Free: 2.8 pg/mL (ref 2.3–4.2)

## 2014-09-05 NOTE — Progress Notes (Signed)
   Subjective:    Patient ID: Justin [name], male    DOB: 1933/10/03, 81 y.o.   MRN: [id number]  HPI Here for a cpx. We met last week and had a long discussion about his edema, his HTN, and his chronic cough. He is scheduled to see Pulmonary this Wednesday. He is now taking Metoprolol and his  BP is stable. The edema is slowly going down. His recent labs here were remarkable for an elevated TSH of 5.67.    Review of Systems  Constitutional: Negative.   HENT: Negative.   Eyes: Negative.   Respiratory: Negative.   Cardiovascular: Positive for leg swelling. Negative for chest pain and palpitations.  Gastrointestinal: Negative.   Genitourinary: Negative.   Musculoskeletal: Negative.   Skin: Negative.   Neurological: Negative.   Psychiatric/Behavioral: Negative.        Objective:   Physical Exam  Constitutional: He is oriented to person, place, and time. He appears well-developed and well-nourished. No distress.  HENT:  Head: Normocephalic and atraumatic.  Right Ear: External ear normal.  Left Ear: External ear normal.  Nose: Nose normal.  Mouth/Throat: Oropharynx is clear and moist. No oropharyngeal exudate.  Eyes: Conjunctivae and EOM are normal. Pupils are equal, round, and reactive to light. Right eye exhibits no discharge. Left eye exhibits no discharge. No scleral icterus.  Neck: Neck supple. No JVD present. No tracheal deviation present. No thyromegaly present.  Cardiovascular: Normal rate, regular rhythm, normal heart sounds and intact distal pulses.  Exam reveals no gallop and no friction rub.   No murmur heard. Pulmonary/Chest: Effort normal and breath sounds normal. No respiratory distress. He has no wheezes. He has no rales. He exhibits no tenderness.  Abdominal: Soft. Bowel sounds are normal. He exhibits no distension and no mass. There is no tenderness. There is no rebound and no guarding.  Musculoskeletal: Normal range of motion. He exhibits no edema or tenderness.    Lymphadenopathy:    He has no cervical adenopathy.  Neurological: He is alert and oriented to person, place, and time. He has normal reflexes. No cranial nerve deficit. He exhibits normal muscle tone. Coordination normal.  Skin: Skin is warm and dry. No rash noted. He is not diaphoretic. No erythema. No pallor.  Psychiatric: He has a normal mood and affect. His behavior is normal. Judgment and thought content normal.          Assessment & Plan:  Well exam. We will further check his thyroid status by getting a free T3 and T4 today. The edema is stable and his HTN is stable.

## 2014-09-05 NOTE — Progress Notes (Signed)
Pre visit review using our clinic review tool, if applicable. No additional management support is needed unless otherwise documented below in the visit note. 

## 2014-09-07 ENCOUNTER — Encounter: Payer: Self-pay | Admitting: Pulmonary Disease

## 2014-09-07 ENCOUNTER — Ambulatory Visit (INDEPENDENT_AMBULATORY_CARE_PROVIDER_SITE_OTHER): Payer: Medicare Other | Admitting: Pulmonary Disease

## 2014-09-07 VITALS — BP 124/60 | HR 70 | Temp 97.2°F | Ht 67.0 in | Wt 280.0 lb

## 2014-09-07 DIAGNOSIS — R05 Cough: Secondary | ICD-10-CM | POA: Diagnosis not present

## 2014-09-07 DIAGNOSIS — R053 Chronic cough: Secondary | ICD-10-CM

## 2014-09-07 MED ORDER — TRAMADOL HCL 50 MG PO TABS: 50.0000 mg | ORAL_TABLET | Freq: Four times a day (QID) | ORAL | Status: DC | PRN

## 2014-09-07 NOTE — Patient Instructions (Addendum)
Will refer to ENT to check your upper airway and make sure there is no structural abnormality You must avoid ALL throat clearing.  Can drink water, or just swallow Put hard candy in your mouth from sun-up to sun-down to prevent cough and throat clearing.  No mint of any kind, and no menthol or cough drops (jolly ranchers, butterscotch, werthers, fruit lifesavers) Minimize voice use as much as possible Get chlorpheniramine 4mg  OTC, and take 2 at bedtime each night.  Take only one of your zyrtec during day while on this. Take tramadol 50mg  one every 6 hrs if needed for cough suppression.  Qvar 33mcg, take 2 inhalations each am with spacer until next visit.  Rinse mouth well after using.  followup with me again in 3 weeks.

## 2014-09-07 NOTE — Assessment & Plan Note (Signed)
The patient has a chronic cough over one year duration, and based on his history this sounds most consistent with an upper airway etiology. However, we did do spirometry today, and he clearly has mild airflow limitation. This would be most consistent with asthma. I suspect that he has both upper and lower airway causes for his cough, and would like to start him on an inhaled steroid for his air flow limitation. I've also had a long discussion with him about upper airway cough syndrome, and how we need to treat him aggressively for postnasal drip, reflux, and finally his cyclical coughing. I have stressed to him the importance of totally avoiding throat clearing, and have also reviewed the various behavioral therapies that can help with cough propagation. Also, given his chronic hoarseness and ongoing cough, I think he should have an upper airway evaluation by otolaryngology as well.

## 2014-09-07 NOTE — Progress Notes (Signed)
   Subjective:    Patient ID: Justin [name], male    DOB: 07-27-1934, 82 y.o.   MRN: [id number]  HPI The patient is a 81 year old male who I've been asked to see for chronic cough. The patient states he has had a cough for over a year, and feels that it has been progressive in nature. His primarily dry and hacking, and he believes it is related to a tickle sensation in his throat. He describes a classic globus sensation, and has constant throat clearing during the day and also during our visit. The cough is worsened with conversation, and also after eating especially in the evening. It is made better if he drinks hot tea. He has had chronic hoarseness, but no throat discomfort. He has never been evaluated by otolaryngology. He has had a recent chest x-ray that was unremarkable. He was on an ACE inhibitor, but this has been discontinued and he has done better. He has been tried on treatment for reflux with Nexium twice a day, but this resulted in significant diarrhea. He does have a history of allergies, but doesn't feel it is a major issue for him currently. He is not using his nasal corticosteroid spray.   Review of Systems  Constitutional: Negative for fever and unexpected weight change.  HENT: Positive for congestion, dental problem, postnasal drip and sneezing. Negative for ear pain, nosebleeds, rhinorrhea, sinus pressure, sore throat and trouble swallowing.   Eyes: Negative for redness and itching.  Respiratory: Positive for cough, chest tightness, shortness of breath and wheezing.   Cardiovascular: Negative for palpitations and leg swelling.  Gastrointestinal: Negative for nausea and vomiting.  Genitourinary: Negative for dysuria.  Musculoskeletal: Negative for joint swelling.  Skin: Negative for rash.  Neurological: Negative for headaches.  Hematological: Does not bruise/bleed easily.  Psychiatric/Behavioral: Negative for dysphoric mood. The patient is not nervous/anxious.          Objective:   Physical Exam Constitutional:  Obese male, no acute distress  HENT:  Nares patent without discharge  Oropharynx without exudate, palate and uvula are normal  Eyes:  Perrla, eomi, no scleral icterus  Neck:  No JVD, no TMG  Cardiovascular:  Normal rate, regular rhythm, no rubs or gallops.  No murmurs        Intact distal pulses  Pulmonary :  Normal breath sounds, no stridor or respiratory distress   No rales, rhonchi, or wheezing  Abdominal:  Soft, nondistended, bowel sounds present.  No tenderness noted.   Musculoskeletal:  1+ lower extremity edema noted.  Lymph Nodes:  No cervical lymphadenopathy noted  Skin:  No cyanosis noted  Neurologic:  Alert, appropriate, moves all 4 extremities without obvious deficit.         Assessment & Plan:

## 2014-09-14 DIAGNOSIS — K219 Gastro-esophageal reflux disease without esophagitis: Secondary | ICD-10-CM | POA: Diagnosis not present

## 2014-09-14 DIAGNOSIS — R05 Cough: Secondary | ICD-10-CM | POA: Diagnosis not present

## 2014-09-21 ENCOUNTER — Institutional Professional Consult (permissible substitution): Payer: Medicare Other | Admitting: Pulmonary Disease

## 2014-09-21 DIAGNOSIS — D1801 Hemangioma of skin and subcutaneous tissue: Secondary | ICD-10-CM | POA: Diagnosis not present

## 2014-09-21 DIAGNOSIS — L821 Other seborrheic keratosis: Secondary | ICD-10-CM | POA: Diagnosis not present

## 2014-09-21 DIAGNOSIS — L570 Actinic keratosis: Secondary | ICD-10-CM | POA: Diagnosis not present

## 2014-09-21 DIAGNOSIS — D485 Neoplasm of uncertain behavior of skin: Secondary | ICD-10-CM | POA: Diagnosis not present

## 2014-09-21 DIAGNOSIS — L820 Inflamed seborrheic keratosis: Secondary | ICD-10-CM | POA: Diagnosis not present

## 2014-09-30 ENCOUNTER — Encounter: Payer: Self-pay | Admitting: Pulmonary Disease

## 2014-09-30 ENCOUNTER — Ambulatory Visit (INDEPENDENT_AMBULATORY_CARE_PROVIDER_SITE_OTHER): Payer: Medicare Other | Admitting: Pulmonary Disease

## 2014-09-30 VITALS — BP 134/76 | HR 61 | Temp 97.6°F | Ht 67.0 in | Wt 274.6 lb

## 2014-09-30 DIAGNOSIS — R053 Chronic cough: Secondary | ICD-10-CM

## 2014-09-30 DIAGNOSIS — R05 Cough: Secondary | ICD-10-CM | POA: Diagnosis not present

## 2014-09-30 DIAGNOSIS — J387 Other diseases of larynx: Secondary | ICD-10-CM | POA: Diagnosis not present

## 2014-09-30 MED ORDER — BECLOMETHASONE DIPROPIONATE 80 MCG/ACT IN AERS: 2.0000 | INHALATION_SPRAY | RESPIRATORY_TRACT | Status: DC

## 2014-09-30 NOTE — Assessment & Plan Note (Signed)
The patient clearly has an upper airway etiology for his cough, with postnasal drip being an ongoing issue. 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 I have asked him to try and avoid throat clearing, voice rest is much as possible, and to continue with his heart candy. I hope over time this gradually improved.

## 2014-09-30 NOTE — Assessment & Plan Note (Signed)
The patient has been on Qvar since the last visit, and has definitely seen improvement in his cough and his breathing. Again, I think his cough is from his upper and lower airway, and will ask him to continue on once a day Qvar for now at the higher strength.

## 2014-09-30 NOTE — Patient Instructions (Signed)
Continue with hard candy, limit voice use when able, no throat clearing. Stay on qvar 2 inhalations each am. Take your chlorpheniramine at bedtime, zyrtec once a day, and stay on your nasal spray 2 each nostril each am.  Finish up your prescription for acid reflux, then try to come off.  If cough worsens though, get back on the medication.  followup with me again in 70mos, but call if symptoms are worsening.

## 2014-09-30 NOTE — Progress Notes (Signed)
   Subjective:    Patient ID: Justin [name], male    DOB: June 08, 1934, 81 y.o.   MRN: [id number]  HPI The patient comes in today for follow-up of his known chronic cough. This was felt to be secondary to the irritable larynx syndrome, but he also had mild airflow obstruction on his spirometry. He was started on Qvar, and feels that it has helped his cough, but it also has helped his breathing. He was referred to otolaryngology for an upper airway exam, and found to have some inflammation of his larynx but no upper airway lesion. He has been working on behavioral therapy, treatment of acid reflux, and also treatment of postnasal drip. He feels that his cough is at least 60% improved from the last visit.   Review of Systems  Constitutional: Negative for fever and unexpected weight change.  HENT: Positive for congestion and postnasal drip. Negative for dental problem, ear pain, nosebleeds, rhinorrhea, sinus pressure, sneezing, sore throat and trouble swallowing.   Eyes: Negative for redness and itching.  Respiratory: Positive for cough. Negative for chest tightness, shortness of breath and wheezing.   Cardiovascular: Positive for leg swelling. Negative for palpitations.  Gastrointestinal: Negative for nausea and vomiting.  Genitourinary: Negative for dysuria.  Musculoskeletal: Negative for joint swelling.  Skin: Negative for rash.  Neurological: Negative for headaches.  Hematological: Does not bruise/bleed easily.  Psychiatric/Behavioral: Negative for dysphoric mood. The patient is not nervous/anxious.        Objective:   Physical Exam Overweight male in no acute distress Nose without purulence or discharge noted Neck without lymphadenopathy or thyromegaly Chest totally clear to auscultation, no wheezing Cardiac exam with regular rate and rhythm, 3/6 blowing systolic murmur Lower extremities with mild edema, no cyanosis Alert and oriented, moves all 4 extremities.       Assessment  & Plan:

## 2014-10-04 ENCOUNTER — Ambulatory Visit (INDEPENDENT_AMBULATORY_CARE_PROVIDER_SITE_OTHER): Payer: Medicare Other | Admitting: Podiatry

## 2014-10-04 ENCOUNTER — Encounter: Payer: Self-pay | Admitting: Podiatry

## 2014-10-04 ENCOUNTER — Ambulatory Visit: Payer: Self-pay

## 2014-10-04 VITALS — BP 134/55 | HR 62 | Resp 18

## 2014-10-04 DIAGNOSIS — B351 Tinea unguium: Secondary | ICD-10-CM | POA: Diagnosis not present

## 2014-10-04 DIAGNOSIS — M79676 Pain in unspecified toe(s): Secondary | ICD-10-CM | POA: Diagnosis not present

## 2014-10-10 ENCOUNTER — Encounter: Payer: Self-pay | Admitting: Podiatry

## 2014-10-10 NOTE — Progress Notes (Signed)
Patient ID: Justin [name], male   DOB: 1934/02/26, 81 y.o.   MRN: [id number]  Subjective: 81 year old male presents the office they with complaints of painful, thick nails. He states that he is unable to trim them himself and the nails become painful particularly with pressure. He denies any recent redness or drainage from around the nail sites. No other complaints at this time in no acute changes since last appointment.  Objective: AAO 3, NAD DP/PT pulses palpable, CRT less than 3 seconds Protective sensation intact with Simms Weinstein monofilament, vibratory sensation intact, Achilles tendon reflex intact. Nails are hypertrophic, dystrophic, elongated, brittle, discolored 10. There is no surrounding erythema or drainage from the nail sites. There is subjective tenderness on the nails. No open lesions or pre-ulcer lesions identified. No other areas of tenderness to bilateral lower extremities an overlying edema, erythema, increased warmth. MMT 5/5, ROM WNL No pain with calf compression, swelling, warmth, erythema.  Assessment: 81 year old male with symptomatic onychomycosis  Plan: -Nail sharply debrided 10 without complication/bleeding. -Discussed daily foot inspection. -Follow-up in 3 months or sooner if any problems are to arise. In the meantime, encouraged to call the office with any questions, concerns, change in symptoms.

## 2014-10-17 ENCOUNTER — Other Ambulatory Visit: Payer: Self-pay | Admitting: Family Medicine

## 2014-11-10 ENCOUNTER — Other Ambulatory Visit (HOSPITAL_COMMUNITY): Payer: Self-pay | Admitting: Urology

## 2014-11-10 DIAGNOSIS — C61 Malignant neoplasm of prostate: Secondary | ICD-10-CM

## 2014-11-15 DIAGNOSIS — R6889 Other general symptoms and signs: Secondary | ICD-10-CM | POA: Diagnosis not present

## 2014-11-21 ENCOUNTER — Other Ambulatory Visit: Payer: Self-pay | Admitting: Family Medicine

## 2014-11-28 DIAGNOSIS — C61 Malignant neoplasm of prostate: Secondary | ICD-10-CM | POA: Diagnosis not present

## 2014-12-02 ENCOUNTER — Ambulatory Visit (HOSPITAL_COMMUNITY)
Admission: RE | Admit: 2014-12-02 | Discharge: 2014-12-02 | Disposition: A | Discharge status: Home / Self Care | Payer: Medicare Other | Source: Ambulatory Visit | Attending: Urology | Admitting: Urology

## 2014-12-02 DIAGNOSIS — C61 Malignant neoplasm of prostate: Secondary | ICD-10-CM | POA: Insufficient documentation

## 2014-12-02 MED ORDER — GADOBENATE DIMEGLUMINE 529 MG/ML IV SOLN
20.0000 mL | Freq: Once | INTRAVENOUS | Status: AC | PRN
  Administered 2014-12-02: 20 mL via INTRAVENOUS

## 2014-12-07 DIAGNOSIS — C61 Malignant neoplasm of prostate: Secondary | ICD-10-CM | POA: Diagnosis not present

## 2014-12-15 DIAGNOSIS — H3531 Nonexudative age-related macular degeneration: Secondary | ICD-10-CM | POA: Diagnosis not present

## 2014-12-20 ENCOUNTER — Ambulatory Visit (INDEPENDENT_AMBULATORY_CARE_PROVIDER_SITE_OTHER): Payer: Medicare Other | Admitting: Family Medicine

## 2014-12-20 ENCOUNTER — Encounter: Payer: Self-pay | Admitting: Family Medicine

## 2014-12-20 VITALS — BP 122/66 | HR 58 | Temp 98.3°F | Ht 67.0 in | Wt 267.0 lb

## 2014-12-20 DIAGNOSIS — M6208 Separation of muscle (nontraumatic), other site: Secondary | ICD-10-CM

## 2014-12-20 DIAGNOSIS — R142 Eructation: Secondary | ICD-10-CM | POA: Diagnosis not present

## 2014-12-20 NOTE — Progress Notes (Signed)
Pre visit review using our clinic review tool, if applicable. No additional management support is needed unless otherwise documented below in the visit note. 

## 2014-12-20 NOTE — Progress Notes (Signed)
   Subjective:    Patient ID: Justin [name], male    DOB: 10-Nov-1933, 81 y.o.   MRN: [id number]  HPI Ere with several issues. First he asks if he has a hernia in the abdomen. He has noticed a lump in the center of the abdomen that comes and goes. It is most noticeable when he sits up or lies down. There is no pain. Also for the past 2 months he has had frequent bloating in the belly and frequent belching. He feels pressure build up in the epigastrium and feels the need to belch, then he feels relief after that. His BMs are regular. No chest pain or SOB. Of note he made some dramatic changes in his diet about 3 months ago which included eating a lot more vegetables, including broccoli and other greens along with a variety of beans. He has started to take Nexium 20 mg daily.    Review of Systems  Constitutional: Negative.   Respiratory: Negative.   Cardiovascular: Negative.   Gastrointestinal: Positive for diarrhea and abdominal distention. Negative for nausea, vomiting, abdominal pain, constipation, blood in stool and anal bleeding.       Objective:   Physical Exam  Constitutional: He appears well-developed and well-nourished.  Cardiovascular: Normal rate, regular rhythm, normal heart sounds and intact distal pulses.   Pulmonary/Chest: Effort normal and breath sounds normal.  Abdominal: Soft. Bowel sounds are normal. He exhibits no distension and no mass. There is no tenderness. There is no rebound and no guarding.          Assessment & Plan:  I think his bloating and GI gas production are the result of his dietary changes. I advised him to eat less beans and less broccoli. Stay on the Nexium. Try a probiotic like Align daily. He has a diastasis recti. I explained to him that this is not an actual hernia and it should never bother him.

## 2014-12-29 ENCOUNTER — Ambulatory Visit: Payer: Medicare Other | Admitting: Pulmonary Disease

## 2015-01-03 ENCOUNTER — Encounter: Payer: Self-pay | Admitting: Pulmonary Disease

## 2015-01-03 ENCOUNTER — Ambulatory Visit (INDEPENDENT_AMBULATORY_CARE_PROVIDER_SITE_OTHER): Payer: Medicare Other | Admitting: Pulmonary Disease

## 2015-01-03 VITALS — BP 122/74 | HR 61 | Temp 97.0°F | Ht 67.0 in | Wt 269.2 lb

## 2015-01-03 DIAGNOSIS — J4520 Mild intermittent asthma, uncomplicated: Secondary | ICD-10-CM | POA: Diagnosis not present

## 2015-01-03 DIAGNOSIS — J387 Other diseases of larynx: Secondary | ICD-10-CM | POA: Diagnosis not present

## 2015-01-03 MED ORDER — ALBUTEROL SULFATE 108 (90 BASE) MCG/ACT IN AEPB: 2.0000 | INHALATION_SPRAY | Freq: Four times a day (QID) | RESPIRATORY_TRACT | Status: DC | PRN

## 2015-01-03 NOTE — Assessment & Plan Note (Signed)
The patient has done well on Qvar, but he will occasionally have some wheezing and increased cough. Told him that Qvar is typically a twice a day drug, and can increase to twice a day if he is having breakthrough symptoms. Otherwise, he is to stay on once a day at the higher strength.

## 2015-01-03 NOTE — Assessment & Plan Note (Signed)
The patient states that his cough is completely controlled as long as he takes his antihistamines on a regular basis. He tried to discontinue his proton pump inhibitor, and had increased belching. It was then restarted by his primary care physician. I have also reminded him of behavioral therapies, and also hard candy in order to prevent cyclical coughing.

## 2015-01-03 NOTE — Progress Notes (Signed)
   Subjective:    Patient ID: Justin [name], male    DOB: 02/18/1934, 81 y.o.   MRN: [id number]  HPI The patient comes in today for follow-up of his known mild intermittent asthma and also upper airway cough related to postnasal drip and irritable larynx syndrome. He is currently doing extremely well as long as he stays on his antihistamine, and was not able to completely stop his proton pump inhibitor. He continues with behavioral therapies and hard candy whenever he has increased tickle. He has been staying on Qvar high-dose once a day, and feels that he has done very well with this. It has definitely helped both his cough and breathing, and he will only have breakthrough symptoms on rare occasions.   Review of Systems  Constitutional: Negative for fever and unexpected weight change.  HENT: Positive for congestion, postnasal drip and rhinorrhea. Negative for dental problem, ear pain, nosebleeds, sinus pressure, sneezing, sore throat and trouble swallowing.   Eyes: Negative for redness and itching.  Respiratory: Positive for wheezing. Negative for cough, chest tightness and shortness of breath.   Cardiovascular: Negative for palpitations and leg swelling.  Gastrointestinal: Negative for nausea and vomiting.  Genitourinary: Negative for dysuria.  Musculoskeletal: Negative for joint swelling.  Skin: Negative for rash.  Neurological: Negative for headaches.  Hematological: Does not bruise/bleed easily.  Psychiatric/Behavioral: Negative for dysphoric mood. The patient is not nervous/anxious.        Objective:   Physical Exam Obese male in no acute distress Nose without purulence or discharge noted Neck without lymphadenopathy or thyromegaly Chest completely clear, no wheezing Cardiac exam with regular rate and rhythm, 2/6 blowing murmur Lower extremities with mild ankle edema, no cyanosis Alert and oriented, moves all 4 extremities.       Assessment & Plan:

## 2015-01-03 NOTE — Patient Instructions (Signed)
Continue on your antihistamine as needed, as well as reflux meds. Stay on qvar 2 inhalations each am, but can increase to am AND pm if increased wheezing or increased cough. Will give you a prescription for a rescue inhaler to use for emergencies only.  proair respiclick 2 inhalations every 6 hrs if needed for rescue.  followup with [name] in one year if doing well, but call if having issues

## 2015-01-04 ENCOUNTER — Ambulatory Visit (INDEPENDENT_AMBULATORY_CARE_PROVIDER_SITE_OTHER): Payer: Medicare Other | Admitting: Podiatry

## 2015-01-04 ENCOUNTER — Encounter: Payer: Self-pay | Admitting: Podiatry

## 2015-01-04 VITALS — BP 141/64 | HR 68 | Resp 18

## 2015-01-04 DIAGNOSIS — M79676 Pain in unspecified toe(s): Secondary | ICD-10-CM

## 2015-01-04 DIAGNOSIS — B351 Tinea unguium: Secondary | ICD-10-CM

## 2015-01-05 ENCOUNTER — Encounter: Payer: Self-pay | Admitting: Internal Medicine

## 2015-01-08 ENCOUNTER — Encounter: Payer: Self-pay | Admitting: Podiatry

## 2015-01-08 NOTE — Progress Notes (Signed)
Patient ID: Justin [name], male   DOB: 1933-09-03, 81 y.o.   MRN: [id number]  Subjective: [name] presents the office they with complaints of painful, thick nails which he is unable to trim himself. He denies any recent redness or drainage from around the nail sites. No other complaints at this time in no acute changes since last appointment.  Objective: AAO 3, NAD DP/PT pulses palpable, CRT less than 3 seconds Protective sensation intact with Simms Weinstein monofilament, vibratory sensation intact, Achilles tendon reflex intact. Nails are hypertrophic, dystrophic, elongated, brittle, discolored 10. There is no surrounding erythema or drainage from the nail sites. There is subjective tenderness on the nails. No open lesions or pre-ulcer lesions identified. No other areas of tenderness to bilateral lower extremities an overlying edema, erythema, increased warmth. MMT 5/5, ROM WNL No pain with calf compression, swelling, warmth, erythema.  Assessment: 81 year old male with symptomatic onychomycosis  Plan: -Nail sharply debrided 10 without complication/bleeding. -Discussed daily foot inspection. -Follow-up in 3 months or sooner if any problems are to arise. In the meantime, encouraged to call the office with any questions, concerns, change in symptoms.

## 2015-01-27 ENCOUNTER — Telehealth: Payer: Self-pay | Admitting: *Deleted

## 2015-01-27 ENCOUNTER — Encounter: Payer: Self-pay | Admitting: Cardiovascular Disease

## 2015-01-27 ENCOUNTER — Ambulatory Visit (INDEPENDENT_AMBULATORY_CARE_PROVIDER_SITE_OTHER): Payer: Medicare Other | Admitting: Cardiovascular Disease

## 2015-01-27 VITALS — BP 128/74 | HR 66 | Ht 67.0 in | Wt 269.0 lb

## 2015-01-27 DIAGNOSIS — C61 Malignant neoplasm of prostate: Secondary | ICD-10-CM | POA: Insufficient documentation

## 2015-01-27 DIAGNOSIS — I10 Essential (primary) hypertension: Secondary | ICD-10-CM | POA: Diagnosis not present

## 2015-01-27 DIAGNOSIS — I350 Nonrheumatic aortic (valve) stenosis: Secondary | ICD-10-CM | POA: Diagnosis not present

## 2015-01-27 DIAGNOSIS — E785 Hyperlipidemia, unspecified: Secondary | ICD-10-CM | POA: Diagnosis not present

## 2015-01-27 DIAGNOSIS — I2510 Atherosclerotic heart disease of native coronary artery without angina pectoris: Secondary | ICD-10-CM

## 2015-01-27 MED ORDER — METOPROLOL TARTRATE 50 MG PO TABS: 50.0000 mg | ORAL_TABLET | Freq: Two times a day (BID) | ORAL | Status: DC

## 2015-01-27 NOTE — Telephone Encounter (Signed)
Attempted to start PA Need to know if patient has tried Flovent, [employer] Diskus or Pulmicort? PA has been saved until we can get a response back from patient. I left a vmail on 01/27/2015 at 4:45.

## 2015-01-27 NOTE — Telephone Encounter (Signed)
Would go ahead and do PA since flovent is not a great drug for him.

## 2015-01-27 NOTE — Patient Instructions (Signed)
Medication Instructions:  Your physician has recommended you make the following change in your medication: Increase metoprolol tartrate to 50 mg by mouth twice daily.   Labwork: none  Testing/Procedures: Your physician has requested that you have an echocardiogram. Echocardiography is a painless test that uses sound waves to create images of your heart. It provides your doctor with information about the size and shape of your heart and how well your heart's chambers and valves are working. This procedure takes approximately one hour. There are no restrictions for this procedure.    Follow-Up: Your physician wants you to follow-up in: 12 months.  You will receive a reminder letter in the mail two months in advance. If you don't receive a letter, please call our office to schedule the follow-up appointment.

## 2015-01-27 NOTE — Progress Notes (Signed)
No chief complaint on file.   History of Present Illness: 81 yo WM with a h/o HTN, HLD and CAD s/p 4V CABG March 2007 who is here today for cardiac follow up. He has been followed in the past by Dr. Rayford Halsted and then by Dr. Haroldine Laws. He has not had a cardiac cath since his bypass in 2007. Carotid u/s in 10/10: 0-39% bilaterally. Had nuclear stress test 4/11 EF 54%. Inferior, inferoseptal and apical defect consistent with scar and possible soft tissue attenuation. No signficiant ischemia. Echo June 2015 with normal EF, moderate Aortic valve stenosis with mean gradient 14 mmHg. AVA around 1.0 cm2.   He is here today for follow up. He has been doing well. No CP or dyspnea. Compliant with medications. He exercises 3-4 days per week. He is still working as a Chief Strategy Officer. Occasional mild LE edema.   Primary Care Physician: Dr. Sarajane Jews   Past Medical History  Diagnosis Date  . Allergic     (sees Dr. Velora Heckler)  . Hyperlipidemia   . CAD (coronary artery disease)     sees Dr. Haroldine Laws   . Myocardial infarction   . S/P CABG (coronary artery bypass graft) 2007     Coronary artery bypass grafting x4 with left internal(Edward Servando Snare, MD)  . Carotid bruit     carotid u/s 10/10: 0.39% bilaterally  . Hx of colonic polyps     (sees Dr. Henrene Pastor)  . ED (erectile dysfunction)   . HTN (hypertension)   . Precancerous skin lesion     (sees Dr. Allyson Sabal)  . Osteoarthritis     especially in left knee, (sees Dr. Alphonzo Severance )  . Benign prostatic hypertrophy     (sees Dr. Denman George  . Obesity   . Gout   . GERD (gastroesophageal reflux disease)     Past Surgical History  Procedure Laterality Date  . Coronary artery bypass graft  2007  . Colonoscopy  06/30/2008    per Dr. Henrene Pastor, Repeat  in 5 years.  . Cataract extraction, bilateral    . Cardiac catheterization  10/29/2005  . Hammer toe repairs bilaterally    . Repair of right thumb injury    . Bilateral subcutaneous mastectomies    . Knee arthroplasty   07/30/2011    Procedure: COMPUTER ASSISTED TOTAL KNEE ARTHROPLASTY;  Surgeon: Meredith Pel;  Location: [facility];  Service: Orthopedics;  Laterality: Left;  left total knee arthroplasty  . Replacement total knee bilateral  2012    Current Outpatient Prescriptions  Medication Sig Dispense Refill  . Albuterol Sulfate (PROAIR RESPICLICK) 101 (90 BASE) MCG/ACT AEPB Inhale 2 puffs into the lungs every 6 (six) hours as needed. (Patient taking differently: Inhale 2 puffs into the lungs every 6 (six) hours as needed (shortness of breath or wheezing). ) 1 each 0  . aspirin 325 MG tablet Take 325 mg by mouth daily.    [name] atorvastatin (LIPITOR) 20 MG tablet TAKE 1 TABLET ONCE DAILY. (Patient taking differently: TAKE 1 TABLET BY MOUTH ONCE DAILY.) 90 tablet 3  . beclomethasone (QVAR) 80 MCG/ACT inhaler Inhale 2 puffs into the lungs every morning. 1 Inhaler 3  . cetirizine (ZYRTEC) 10 MG tablet Take 10 mg by mouth daily.     . chlorpheniramine (CHLOR-TRIMETON) 4 MG tablet Take 8 mg by mouth at bedtime.    . metoprolol (LOPRESSOR) 50 MG tablet TAKE 1 TABLET DAILY. (Patient taking differently: TAKE 1 TABLET BY MOUTH DAILY.) 30 tablet 11  . oxymetazoline (AFRIN)  0.05 % nasal spray Place 1 spray into both nostrils 2 (two) times daily as needed for congestion.     . sildenafil (VIAGRA) 100 MG tablet Take 1 tablet (100 mg total) by mouth as needed for erectile dysfunction. (Patient taking differently: Take 100 mg by mouth daily as needed for erectile dysfunction. ) 10 tablet 11  . Triamcinolone Acetonide (NASACORT AQ NA) Place 2 sprays into the nose every morning.     No current facility-administered medications for this visit.    Allergies  Allergen Reactions  . Amlodipine     Swelling in ankles  . Peanut-Containing Drug Products Anaphylaxis  . Lisinopril     cough  . Prednisone     Constipation & "he could not sleep" **Can tolerate in certain situations**  . Sulfonamide Derivatives     REACTION:  anaphylaxis    History   Social History  . Marital Status: Married    Spouse Name: N/A  . Number of Children: N/A  . Years of Education: N/A   Occupational History  . general contractor     builds malls   Social History Main Topics  . Smoking status: Former Smoker -- 3.00 packs/day for 6 years    Types: Cigarettes    Quit date: 08/26/1958  . Smokeless tobacco: Never Used  . Alcohol Use: 0.0 oz/week    0 Standard drinks or equivalent per week     Comment: 2-3 per week  . Drug Use: No  . Sexual Activity: Not on file   Other Topics Concern  . Not on file   Social History Narrative   FH of CAD, Male 1st degree relative less than age 4.    Family History  Problem Relation Age of Onset  . Heart attack Father 36  . Heart failure Mother 87  . Uterine cancer Mother     Review of Systems:  As stated in the HPI and otherwise negative.   BP 128/74 mmHg  Pulse 66  Ht 5\' 7"  (1.702 m)  Wt 269 lb (122.018 kg)  BMI 42.12 kg/m2  Physical Examination: General: Well developed, well nourished, NAD HEENT: OP clear, mucus membranes moist SKIN: warm, dry. No rashes. Neuro: No focal deficits Musculoskeletal: Muscle strength 5/5 all ext Psychiatric: Mood and affect normal Neck: No JVD, no carotid bruits, no thyromegaly, no lymphadenopathy. Lungs:Clear bilaterally, no wheezes, rhonci, crackles Cardiovascular: Regular rate and rhythm. Loud systolic murmur. No gallops or rubs. Abdomen:Soft. Bowel sounds present. Non-tender.  Extremities: No lower extremity edema. Pulses are 2 + in the bilateral DP/PT.  Echo 01/26/14: Left ventricle: The cavity size was normal. There was moderate concentric hypertrophy. Systolic function was normal. The estimated ejection fraction was in the range of 50% to 55%. Wall motion was normal; there were no regional wall motion abnormalities. Features are consistent with a pseudonormal left ventricular filling pattern, with concomitant abnormal  relaxation and increased filling pressure (grade 2 diastolic dysfunction). - Aortic valve: Cusp separation was moderately reduced. There was moderate stenosis. There was mild regurgitation. Valve area (VTI): 0.89 cm^2. Valve area (Vmax): 0.93 cm^2. - Mitral valve: Calcified annulus. Mobility was not restricted. Transvalvular velocity was within the normal range. There was no evidence for stenosis. - Left atrium: The atrium was moderately to severely dilated. - Tricuspid valve: Structurally normal valve. There was mild regurgitation. - Inferior vena cava: The vessel was normal in size. The respirophasic diameter changes were in the normal range (= 50%), consistent with normal central venous pressure. Impressions: -  Compared to the prior study on February 01, 2013 there is no significant change. The transaortic velocities are lower (peak 23, mean 14, previosuly peak 55 and mean 46mmHg) and are most probably underestimated on today&'s study.   EKG:  EKG is ordered today. The ekg ordered today demonstrates sinus with 1st degree AV block with PVCs. Poor R wave progression precordial leads.   Recent Labs: 09/01/2014: ALT 19; BUN 9; Creatinine 0.7; Hemoglobin 12.8*; Platelets 248.0; Potassium 3.6; Sodium 139; TSH 5.67*   Lipid Panel    Component Value Date/Time   CHOL 105 09/01/2014 0811   TRIG 119.0 09/01/2014 0811   TRIG 143 06/30/2006 1132   HDL 24.10* 09/01/2014 0811   CHOLHDL 4 09/01/2014 0811   CHOLHDL 4.6 CALC 06/30/2006 1132   VLDL 23.8 09/01/2014 0811   LDLCALC 57 09/01/2014 0811     Wt Readings from Last 3 Encounters:  01/27/15 269 lb (122.018 kg)  01/03/15 269 lb 3.2 oz (122.108 kg)  12/20/14 267 lb (121.11 kg)     Other studies Reviewed: Additional studies/ records that were reviewed today include:  Review of the above records demonstrates:    Assessment and Plan:   1. CAD: s/p CABG. Stable. No changes.  2. Aortic valve stenosis: Moderate  stenosis by echo June 2015. Repeat echo now. We discussed AVR and TAVR when needed. He is asymptomatic.   3. HTN: BP controlled. No changes.   4. HLD: Continue statin. Lipids are well controlled.   Current medicines are reviewed at length with the patient today.  The patient does not have concerns regarding medicines.  The following changes have been made:  no change  Labs/ tests ordered today include:  No orders of the defined types were placed in this encounter.     Disposition:   FU with me in 12  months   Signed, [name], MD 01/27/2015 8:07 AM    [facility] [address], [place], [place]  [address] Phone: [phone]; Fax: [phone]

## 2015-01-27 NOTE — Telephone Encounter (Signed)
Ins now requires a PA for Qvar. Preferred drug is Flovent Would KC like to proceed with PA? Last office visit 01/03/2015 He came as new pt on 09/07/2014 that's when Qvar prescribed.

## 2015-01-30 NOTE — Telephone Encounter (Signed)
Pt is returning Sonya's call. He states he has never had an inhaler before. Ph # [phone]

## 2015-01-31 NOTE — Telephone Encounter (Signed)
PA has been denied  I will complete appeal form and fax back. Awaiting decision.

## 2015-01-31 NOTE — Telephone Encounter (Signed)
PA for Qvar has been submitted thru [email or web address] May have to appeal decision if not approved.

## 2015-02-02 MED ORDER — BECLOMETHASONE DIPROPIONATE 80 MCG/ACT IN AERS: 2.0000 | INHALATION_SPRAY | RESPIRATORY_TRACT | Status: DC

## 2015-02-02 NOTE — Telephone Encounter (Signed)
Called to check on status of PA PA # [phone] Insurance ID # [id number]  Appeal is approved through 08/26/2015 Pt can pick up Rx at pharmacy.  Patient aware. Nothing further needed.

## 2015-02-02 NOTE — Telephone Encounter (Signed)
his inhaler was denied and he is wanting to know what to do and which one to take pt is calling back [phone]

## 2015-02-06 ENCOUNTER — Ambulatory Visit (HOSPITAL_COMMUNITY): Payer: Medicare Other | Attending: Cardiology

## 2015-02-06 ENCOUNTER — Other Ambulatory Visit: Payer: Self-pay

## 2015-02-06 DIAGNOSIS — I059 Rheumatic mitral valve disease, unspecified: Secondary | ICD-10-CM | POA: Diagnosis not present

## 2015-02-06 DIAGNOSIS — I2510 Atherosclerotic heart disease of native coronary artery without angina pectoris: Secondary | ICD-10-CM | POA: Diagnosis not present

## 2015-02-06 DIAGNOSIS — I350 Nonrheumatic aortic (valve) stenosis: Secondary | ICD-10-CM | POA: Diagnosis not present

## 2015-02-06 DIAGNOSIS — I517 Cardiomegaly: Secondary | ICD-10-CM | POA: Insufficient documentation

## 2015-02-06 DIAGNOSIS — I351 Nonrheumatic aortic (valve) insufficiency: Secondary | ICD-10-CM | POA: Diagnosis not present

## 2015-03-10 DIAGNOSIS — L570 Actinic keratosis: Secondary | ICD-10-CM | POA: Diagnosis not present

## 2015-03-10 DIAGNOSIS — L820 Inflamed seborrheic keratosis: Secondary | ICD-10-CM | POA: Diagnosis not present

## 2015-03-13 DIAGNOSIS — R6889 Other general symptoms and signs: Secondary | ICD-10-CM | POA: Diagnosis not present

## 2015-03-14 DIAGNOSIS — R6889 Other general symptoms and signs: Secondary | ICD-10-CM | POA: Diagnosis not present

## 2015-03-16 ENCOUNTER — Ambulatory Visit: Payer: Medicare Other | Admitting: Podiatry

## 2015-03-17 ENCOUNTER — Ambulatory Visit (INDEPENDENT_AMBULATORY_CARE_PROVIDER_SITE_OTHER): Payer: Medicare Other | Admitting: Podiatry

## 2015-03-17 DIAGNOSIS — B361 Tinea nigra: Secondary | ICD-10-CM | POA: Diagnosis not present

## 2015-03-17 DIAGNOSIS — M79676 Pain in unspecified toe(s): Secondary | ICD-10-CM | POA: Diagnosis not present

## 2015-03-17 DIAGNOSIS — B351 Tinea unguium: Secondary | ICD-10-CM

## 2015-03-17 NOTE — Progress Notes (Signed)
Patient ID: Justin [name], male   DOB: 1934/04/04, 81 y.o.   MRN: [id number] Complaint:  Visit Type: Patient returns to my office for continued preventative foot care services. Complaint: Patient states" my nails have grown long and thick and become painful to walk and wear shoes" . The patient presents for preventative foot care services. No changes to ROS  Podiatric Exam: Vascular: dorsalis pedis and posterior tibial pulses are palpable bilateral. Capillary return is immediate. Temperature gradient is WNL. Skin turgor WNL  Sensorium: Normal Semmes Weinstein monofilament test. Normal tactile sensation bilaterally. Nail Exam: Pt has thick disfigured discolored nails with subungual debris noted bilateral entire nail hallux through fifth toenails Ulcer Exam: There is no evidence of ulcer or pre-ulcerative changes or infection. Orthopedic Exam: Muscle tone and strength are WNL. No limitations in general ROM. No crepitus or effusions noted. Foot type and digits show no abnormalities. Bony prominences are unremarkable. Skin: No Porokeratosis. No infection or ulcers  Diagnosis:  Onychomycosis, , Pain in right toe, pain in left toes  Treatment & Plan Procedures and Treatment: Consent by patient was obtained for treatment procedures. The patient understood the discussion of treatment and procedures well. All questions were answered thoroughly reviewed. Debridement of mycotic and hypertrophic toenails, 1 through 5 bilateral and clearing of subungual debris. No ulceration, no infection noted.  Return Visit-Office Procedure: Patient instructed to return to the office for a follow up visit 3 months for continued evaluation and treatment.

## 2015-04-05 ENCOUNTER — Ambulatory Visit: Payer: Medicare Other | Admitting: Podiatry

## 2015-04-19 DIAGNOSIS — R6889 Other general symptoms and signs: Secondary | ICD-10-CM | POA: Diagnosis not present

## 2015-04-26 DIAGNOSIS — R6889 Other general symptoms and signs: Secondary | ICD-10-CM | POA: Diagnosis not present

## 2015-05-15 DIAGNOSIS — H3531 Nonexudative age-related macular degeneration: Secondary | ICD-10-CM | POA: Diagnosis not present

## 2015-05-15 DIAGNOSIS — Z961 Presence of intraocular lens: Secondary | ICD-10-CM | POA: Diagnosis not present

## 2015-05-17 DIAGNOSIS — C61 Malignant neoplasm of prostate: Secondary | ICD-10-CM | POA: Diagnosis not present

## 2015-05-22 DIAGNOSIS — C61 Malignant neoplasm of prostate: Secondary | ICD-10-CM | POA: Diagnosis not present

## 2015-05-22 DIAGNOSIS — N400 Enlarged prostate without lower urinary tract symptoms: Secondary | ICD-10-CM | POA: Diagnosis not present

## 2015-05-25 DIAGNOSIS — R6889 Other general symptoms and signs: Secondary | ICD-10-CM | POA: Diagnosis not present

## 2015-06-14 ENCOUNTER — Encounter: Payer: Self-pay | Admitting: Podiatry

## 2015-06-14 ENCOUNTER — Ambulatory Visit (INDEPENDENT_AMBULATORY_CARE_PROVIDER_SITE_OTHER): Payer: Medicare Other | Admitting: Podiatry

## 2015-06-14 DIAGNOSIS — B351 Tinea unguium: Secondary | ICD-10-CM

## 2015-06-14 DIAGNOSIS — R6889 Other general symptoms and signs: Secondary | ICD-10-CM | POA: Diagnosis not present

## 2015-06-14 DIAGNOSIS — M79676 Pain in unspecified toe(s): Secondary | ICD-10-CM

## 2015-06-14 NOTE — Progress Notes (Signed)
Patient ID: Justin [name], male   DOB: 07/15/1934, 80 y.o.   MRN: [id number] Complaint:  Visit Type: Patient returns to my office for continued preventative foot care services. Complaint: Patient states" my nails have grown long and thick and become painful to walk and wear shoes" . The patient presents for preventative foot care services. No changes to ROS  Podiatric Exam: Vascular: dorsalis pedis and posterior tibial pulses are palpable bilateral. Capillary return is immediate. Temperature gradient is WNL. Skin turgor WNL  Sensorium: Normal Semmes Weinstein monofilament test. Normal tactile sensation bilaterally. Nail Exam: Pt has thick disfigured discolored nails with subungual debris noted bilateral entire nail hallux through fifth toenails Ulcer Exam: There is no evidence of ulcer or pre-ulcerative changes or infection. Orthopedic Exam: Muscle tone and strength are WNL. No limitations in general ROM. No crepitus or effusions noted. Foot type and digits show no abnormalities. Bony prominences are unremarkable. Skin: No Porokeratosis. No infection or ulcers  Diagnosis:  Onychomycosis, , Pain in right toe, pain in left toes  Treatment & Plan Procedures and Treatment: Consent by patient was obtained for treatment procedures. The patient understood the discussion of treatment and procedures well. All questions were answered thoroughly reviewed. Debridement of mycotic and hypertrophic toenails, 1 through 5 bilateral and clearing of subungual debris. No ulceration, no infection noted.  Return Visit-Office Procedure: Patient instructed to return to the office for a follow up visit 3 months for continued evaluation and treatment. 

## 2015-06-20 DIAGNOSIS — R6889 Other general symptoms and signs: Secondary | ICD-10-CM | POA: Diagnosis not present

## 2015-06-22 DIAGNOSIS — H43813 Vitreous degeneration, bilateral: Secondary | ICD-10-CM | POA: Diagnosis not present

## 2015-06-22 DIAGNOSIS — H353132 Nonexudative age-related macular degeneration, bilateral, intermediate dry stage: Secondary | ICD-10-CM | POA: Diagnosis not present

## 2015-06-27 DIAGNOSIS — R6889 Other general symptoms and signs: Secondary | ICD-10-CM | POA: Diagnosis not present

## 2015-07-13 ENCOUNTER — Telehealth: Payer: Self-pay | Admitting: Pulmonary Disease

## 2015-07-13 NOTE — Telephone Encounter (Signed)
Spoke with pt. He received a letter stating QVAR is not on 2017 formulary. The covered alternatives are arnuity, flovent, incruse, spiriva and serevent diskus. Pt has a recall in chart to see [name] in May 2017. Please advise [name] thanks

## 2015-07-17 MED ORDER — FLUTICASONE PROPIONATE HFA 110 MCG/ACT IN AERO: 1.0000 | INHALATION_SPRAY | Freq: Two times a day (BID) | RESPIRATORY_TRACT | Status: DC

## 2015-07-17 NOTE — Telephone Encounter (Signed)
Advised patient of Dr. Anastasia Pall change in medications Patient says to send to pharmacy on hold until he needs it. Rx sent to pharmacy. Nothing further needed. Closing encounter

## 2015-07-17 NOTE — Telephone Encounter (Signed)
Flovent 110 mcg 1 puff bid

## 2015-08-07 ENCOUNTER — Encounter: Payer: Self-pay | Admitting: Family Medicine

## 2015-08-07 ENCOUNTER — Ambulatory Visit (INDEPENDENT_AMBULATORY_CARE_PROVIDER_SITE_OTHER): Payer: Medicare Other | Admitting: Family Medicine

## 2015-08-07 VITALS — BP 136/72 | HR 65 | Temp 97.9°F | Ht 67.0 in | Wt 276.0 lb

## 2015-08-07 DIAGNOSIS — L02229 Furuncle of trunk, unspecified: Secondary | ICD-10-CM

## 2015-08-07 DIAGNOSIS — J40 Bronchitis, not specified as acute or chronic: Secondary | ICD-10-CM

## 2015-08-07 MED ORDER — AZITHROMYCIN 250 MG PO TABS: ORAL_TABLET | ORAL | Status: DC

## 2015-08-07 MED ORDER — METHYLPREDNISOLONE ACETATE 80 MG/ML IJ SUSP
120.0000 mg | Freq: Once | INTRAMUSCULAR | Status: AC
  Administered 2015-08-07: 120 mg via INTRAMUSCULAR

## 2015-08-07 NOTE — Addendum Note (Signed)
Addended by: Aggie Hacker A on: 08/07/2015 12:26 PM   Modules accepted: Orders

## 2015-08-07 NOTE — Progress Notes (Signed)
Pre visit review using our clinic review tool, if applicable. No additional management support is needed unless otherwise documented below in the visit note. 

## 2015-08-07 NOTE — Progress Notes (Signed)
   Subjective:    Patient ID: Justin [name], male    DOB: [birth year], 81 y.o.   MRN: [id number]  HPI Here for 2 things. First he has had 2 days of chest congestion and coughing up yellow sputum. No fever. Also for the last week he has had a tender swollen lump on the chest. He has had a sebaceous cyst here for years, but it has never become infected before.   Review of Systems  Constitutional: Negative.   HENT: Negative.   Eyes: Negative.   Respiratory: Positive for cough and chest tightness. Negative for shortness of breath and wheezing.   Cardiovascular: Negative.   Skin: Positive for wound.       Objective:   Physical Exam  Constitutional: He appears well-developed and well-nourished.  HENT:  Right Ear: External ear normal.  Left Ear: External ear normal.  Nose: Nose normal.  Mouth/Throat: Oropharynx is clear and moist.  Eyes: Conjunctivae are normal.  Neck: No thyromegaly present.  Cardiovascular: Normal rate, regular rhythm, normal heart sounds and intact distal pulses.   Pulmonary/Chest: Effort normal and breath sounds normal. No respiratory distress. He has no wheezes. He has no rales.  Lymphadenopathy:    He has no cervical adenopathy.  Skin:  Inflamed sebaceous cyst just inferior to left breast           Assessment & Plan:  Treat the bronchitis with a steroid shot and a Zpack. The boil was lanced with a scalpel and a moderate amount of purulent material was expressed from it. Recheck prn

## 2015-09-13 DIAGNOSIS — L820 Inflamed seborrheic keratosis: Secondary | ICD-10-CM | POA: Diagnosis not present

## 2015-09-13 DIAGNOSIS — L218 Other seborrheic dermatitis: Secondary | ICD-10-CM | POA: Diagnosis not present

## 2015-09-13 DIAGNOSIS — R202 Paresthesia of skin: Secondary | ICD-10-CM | POA: Diagnosis not present

## 2015-09-13 DIAGNOSIS — L905 Scar conditions and fibrosis of skin: Secondary | ICD-10-CM | POA: Diagnosis not present

## 2015-09-13 DIAGNOSIS — L570 Actinic keratosis: Secondary | ICD-10-CM | POA: Diagnosis not present

## 2015-09-15 ENCOUNTER — Ambulatory Visit: Payer: Medicare Other | Admitting: Podiatry

## 2015-09-15 ENCOUNTER — Ambulatory Visit (INDEPENDENT_AMBULATORY_CARE_PROVIDER_SITE_OTHER): Payer: Medicare Other | Admitting: Podiatry

## 2015-09-15 ENCOUNTER — Encounter: Payer: Self-pay | Admitting: Podiatry

## 2015-09-15 DIAGNOSIS — B351 Tinea unguium: Secondary | ICD-10-CM

## 2015-09-15 DIAGNOSIS — M79676 Pain in unspecified toe(s): Secondary | ICD-10-CM

## 2015-09-15 NOTE — Progress Notes (Signed)
Patient ID: Justin [name], male   DOB: [birth year], 82 y.o.   MRN: [id number] Complaint:  Visit Type: Patient returns to my office for continued preventative foot care services. Complaint: Patient states" my nails have grown long and thick and become painful to walk and wear shoes" . The patient presents for preventative foot care services. No changes to ROS  Podiatric Exam: Vascular: dorsalis pedis and posterior tibial pulses are palpable bilateral. Capillary return is immediate. Temperature gradient is WNL. Skin turgor WNL  Sensorium: Normal Semmes Weinstein monofilament test. Normal tactile sensation bilaterally. Nail Exam: Pt has thick disfigured discolored nails with subungual debris noted bilateral entire nail hallux through fifth toenails Ulcer Exam: There is no evidence of ulcer or pre-ulcerative changes or infection. Orthopedic Exam: Muscle tone and strength are WNL. No limitations in general ROM. No crepitus or effusions noted. Foot type and digits show no abnormalities. Bony prominences are unremarkable. Skin: No Porokeratosis. No infection or ulcers  Diagnosis:  Onychomycosis, , Pain in right toe, pain in left toes  Treatment & Plan Procedures and Treatment: Consent by patient was obtained for treatment procedures. The patient understood the discussion of treatment and procedures well. All questions were answered thoroughly reviewed. Debridement of mycotic and hypertrophic toenails, 1 through 5 bilateral and clearing of subungual debris. No ulceration, no infection noted.  Return Visit-Office Procedure: Patient instructed to return to the office for a follow up visit 3 months for continued evaluation and treatment.   Gardiner Barefoot DPM

## 2015-09-18 ENCOUNTER — Ambulatory Visit (INDEPENDENT_AMBULATORY_CARE_PROVIDER_SITE_OTHER): Payer: Medicare Other | Admitting: Family Medicine

## 2015-09-18 ENCOUNTER — Encounter: Payer: Self-pay | Admitting: Family Medicine

## 2015-09-18 VITALS — BP 138/76 | HR 62 | Temp 98.4°F | Ht 67.0 in | Wt 276.0 lb

## 2015-09-18 DIAGNOSIS — Z23 Encounter for immunization: Secondary | ICD-10-CM

## 2015-09-18 DIAGNOSIS — Z0000 Encounter for general adult medical examination without abnormal findings: Secondary | ICD-10-CM

## 2015-09-18 LAB — LIPID PANEL
CHOL/HDL RATIO: 3
CHOLESTEROL: 127 mg/dL (ref 0–200)
HDL: 39.5 mg/dL (ref >39.00)
LDL CALC: 60 mg/dL (ref 0–99)
NonHDL: 87.3
TRIGLYCERIDES: 136 mg/dL (ref 0.0–149.0)
VLDL: 27.2 mg/dL (ref 0.0–40.0)

## 2015-09-18 LAB — CBC WITH DIFFERENTIAL/PLATELET
BASOS PCT: 0.4 % (ref 0.0–3.0)
Basophils Absolute: 0.1 10*3/uL (ref 0.0–0.1)
Eosinophils Absolute: 0.3 10*3/uL (ref 0.0–0.7)
Eosinophils Relative: 2 % (ref 0.0–5.0)
HEMATOCRIT: 46.2 % (ref 39.0–52.0)
Hemoglobin: 14.9 g/dL (ref 13.0–17.0)
LYMPHS PCT: 17.8 % (ref 12.0–46.0)
Lymphs Abs: 2.4 10*3/uL (ref 0.7–4.0)
MCHC: 32.2 g/dL (ref 30.0–36.0)
MCV: 85.2 fl (ref 78.0–100.0)
MONOS PCT: 6.4 % (ref 3.0–12.0)
Monocytes Absolute: 0.9 10*3/uL (ref 0.1–1.0)
NEUTROS ABS: 9.8 10*3/uL — AB (ref 1.4–7.7)
Neutrophils Relative %: 73.4 % (ref 43.0–77.0)
PLATELETS: 243 10*3/uL (ref 150.0–400.0)
RBC: 5.42 Mil/uL (ref 4.22–5.81)
RDW: 16 % — AB (ref 11.5–15.5)
WBC: 13.4 10*3/uL — ABNORMAL HIGH (ref 4.0–10.5)

## 2015-09-18 LAB — POCT URINALYSIS DIPSTICK
BILIRUBIN UA: NEGATIVE
Blood, UA: NEGATIVE
Glucose, UA: NEGATIVE
Ketones, UA: NEGATIVE
LEUKOCYTES UA: NEGATIVE
NITRITE UA: NEGATIVE
PH UA: 6.5
Protein, UA: NEGATIVE
Spec Grav, UA: 1.015
Urobilinogen, UA: 0.2

## 2015-09-18 LAB — BASIC METABOLIC PANEL
BUN: 14 mg/dL (ref 6–23)
CALCIUM: 8.6 mg/dL (ref 8.4–10.5)
CO2: 31 mEq/L (ref 19–32)
CREATININE: 0.77 mg/dL (ref 0.40–1.50)
Chloride: 101 mEq/L (ref 96–112)
GFR: 103.52 mL/min (ref >60.00)
GLUCOSE: 101 mg/dL — AB (ref 70–99)
POTASSIUM: 4.5 meq/L (ref 3.5–5.1)
Sodium: 140 mEq/L (ref 135–145)

## 2015-09-18 LAB — HEPATIC FUNCTION PANEL
ALBUMIN: 3.7 g/dL (ref 3.5–5.2)
ALK PHOS: 71 U/L (ref 39–117)
ALT: 15 U/L (ref 0–53)
AST: 15 U/L (ref 0–37)
BILIRUBIN DIRECT: 0.2 mg/dL (ref 0.0–0.3)
Total Bilirubin: 0.9 mg/dL (ref 0.2–1.2)
Total Protein: 6.9 g/dL (ref 6.0–8.3)

## 2015-09-18 LAB — TSH: TSH: 3.73 u[IU]/mL (ref 0.35–4.50)

## 2015-09-18 MED ORDER — AZITHROMYCIN 250 MG PO TABS: ORAL_TABLET | ORAL | Status: DC

## 2015-09-18 MED ORDER — ATORVASTATIN CALCIUM 20 MG PO TABS: 20.0000 mg | ORAL_TABLET | Freq: Every day | ORAL | Status: DC

## 2015-09-18 NOTE — Progress Notes (Signed)
Pre visit review using our clinic review tool, if applicable. No additional management support is needed unless otherwise documented below in the visit note. 

## 2015-09-18 NOTE — Progress Notes (Signed)
   Subjective:    Patient ID: Justin [name], male    DOB: July 01, 1934, 82 y.o.   MRN: [id number]  HPI 82 yr old male for a cpx. He feels well in general. He is set to meet with [name] soon for his asthma. His BP is stable. He sees his Urologist regularly.    Review of Systems  Constitutional: Negative.   HENT: Negative.   Eyes: Negative.   Respiratory: Negative.   Cardiovascular: Negative.   Gastrointestinal: Negative.   Genitourinary: Negative.   Musculoskeletal: Negative.   Skin: Negative.   Neurological: Negative.   Psychiatric/Behavioral: Negative.        Objective:   Physical Exam  Constitutional: He is oriented to person, place, and time. He appears well-developed and well-nourished. No distress.  HENT:  Head: Normocephalic and atraumatic.  Right Ear: External ear normal.  Left Ear: External ear normal.  Nose: Nose normal.  Mouth/Throat: Oropharynx is clear and moist. No oropharyngeal exudate.  Eyes: Conjunctivae and EOM are normal. Pupils are equal, round, and reactive to light. Right eye exhibits no discharge. Left eye exhibits no discharge. No scleral icterus.  Neck: Neck supple. No JVD present. No tracheal deviation present. No thyromegaly present.  Cardiovascular: Normal rate, regular rhythm, normal heart sounds and intact distal pulses.  Exam reveals no gallop and no friction rub.   No murmur heard. Pulmonary/Chest: Effort normal and breath sounds normal. No respiratory distress. He has no wheezes. He has no rales. He exhibits no tenderness.  Abdominal: Soft. Bowel sounds are normal. He exhibits no distension and no mass. There is no tenderness. There is no rebound and no guarding.  Genitourinary: Rectum normal, prostate normal and penis normal. Guaiac negative stool. No penile tenderness.  Musculoskeletal: Normal range of motion. He exhibits no edema or tenderness.  Lymphadenopathy:    He has no cervical adenopathy.  Neurological: He is alert and oriented  to person, place, and time. He has normal reflexes. No cranial nerve deficit. He exhibits normal muscle tone. Coordination normal.  Skin: Skin is warm and dry. No rash noted. He is not diaphoretic. No erythema. No pallor.  Psychiatric: He has a normal mood and affect. His behavior is normal. Judgment and thought content normal.          Assessment & Plan:  Well exam. We discussed diet and exercise. Get fasting labs

## 2015-10-05 ENCOUNTER — Encounter: Payer: Self-pay | Admitting: Acute Care

## 2015-10-05 ENCOUNTER — Telehealth: Payer: Self-pay | Admitting: Acute Care

## 2015-10-05 ENCOUNTER — Ambulatory Visit (INDEPENDENT_AMBULATORY_CARE_PROVIDER_SITE_OTHER): Payer: Medicare Other | Admitting: Acute Care

## 2015-10-05 ENCOUNTER — Ambulatory Visit (INDEPENDENT_AMBULATORY_CARE_PROVIDER_SITE_OTHER)
Admission: RE | Admit: 2015-10-05 | Discharge: 2015-10-05 | Disposition: A | Discharge status: Home / Self Care | Payer: Medicare Other | Source: Ambulatory Visit | Attending: Acute Care | Admitting: Acute Care

## 2015-10-05 VITALS — BP 102/64 | HR 71 | Ht 67.0 in | Wt 274.0 lb

## 2015-10-05 DIAGNOSIS — J4520 Mild intermittent asthma, uncomplicated: Secondary | ICD-10-CM | POA: Diagnosis not present

## 2015-10-05 DIAGNOSIS — J209 Acute bronchitis, unspecified: Secondary | ICD-10-CM | POA: Diagnosis not present

## 2015-10-05 DIAGNOSIS — R0602 Shortness of breath: Secondary | ICD-10-CM | POA: Diagnosis not present

## 2015-10-05 DIAGNOSIS — R05 Cough: Secondary | ICD-10-CM | POA: Diagnosis not present

## 2015-10-05 MED ORDER — DOXYCYCLINE HYCLATE 100 MG PO TABS: 100.0000 mg | ORAL_TABLET | Freq: Two times a day (BID) | ORAL | Status: DC

## 2015-10-05 MED ORDER — FLUTICASONE PROPIONATE HFA 110 MCG/ACT IN AERO: 1.0000 | INHALATION_SPRAY | Freq: Two times a day (BID) | RESPIRATORY_TRACT | Status: DC

## 2015-10-05 MED ORDER — METHYLPREDNISOLONE ACETATE 80 MG/ML IJ SUSP
120.0000 mg | Freq: Once | INTRAMUSCULAR | Status: AC
  Administered 2015-10-05: 120 mg via INTRAMUSCULAR

## 2015-10-05 MED ORDER — ALBUTEROL SULFATE HFA 108 (90 BASE) MCG/ACT IN AERS: 1.0000 | INHALATION_SPRAY | Freq: Four times a day (QID) | RESPIRATORY_TRACT | Status: DC | PRN

## 2015-10-05 NOTE — Progress Notes (Signed)
Subjective:    Patient ID: Justin [name], male    DOB: 1934/02/01, 82 y.o.   MRN: [id number]  HPI  82 year old male with known mild intermittent asthma and also upper airway cough related to postnasal drip and irritable larynx syndrome. Former Immunologist patient who is now seen by [facility].Last seen in the pulmonary office 12/2014.  Significant Events/Procedures:  09/07/14: Spirometry mild airflow obstruction, and a reduced FVC probably due to centripetal obesity.   10/05/15 CXR:  EXAM: CHEST 2 VIEW  COMPARISON: 05/31/2014 chest radiograph.  FINDINGS: Sternotomy wires appear aligned and intact. Stable cardiomediastinal silhouette with mild cardiomegaly. No pneumothorax. Trace left pleural effusion. Cephalization of the pulmonary vasculature without overt pulmonary edema. No acute consolidative airspace disease.  IMPRESSION: 1. Stable mild cardiomegaly without overt pulmonary edema. 2. Trace left pleural effusion .     10/05/15: Acute Office Visit: Pt.  Presents to the office with worsening wheezing and cough . States this started as a sinus infection about 1 week ago that has progressed to his chest. He did see his PCP last week and was given Z-Pack, which he completed today. Sputum is not discolored, and he denies fever.He states that he has had some shortness of breath with exertion.No chest pain, leg pain or hemoptysis.   Review of Systems   Constitutional:   No  weight loss, night sweats,  Fevers, chills, fatigue, or  lassitude.  HEENT:   No headaches,  Difficulty swallowing,  Tooth/dental problems, or  Sore throat,                No sneezing, itching, ear ache, nasal congestion, post nasal drip,   CV:  No chest pain,  Orthopnea, PND, swelling in lower extremities, anasarca, dizziness, palpitations, syncope.   GI  No heartburn, indigestion, abdominal pain, nausea, vomiting, diarrhea, change in bowel habits, loss of appetite, bloody stools.   Resp: + shortness of  breath with exertion not at rest.  + excess mucus, + productive cough,  No non-productive cough,  No coughing up of blood.  No change in color of mucus.  + wheezing.  No chest wall deformity  Skin: no rash or lesions.  GU: no dysuria, change in color of urine, no urgency or frequency.  No flank pain, no hematuria   MS:  No joint pain or swelling.  No decreased range of motion.  No back pain.  Psych:  No change in mood or affect. No depression or anxiety.  No memory loss.   Current outpatient prescriptions:  .  aspirin 325 MG tablet, Take 325 mg by mouth daily., Disp: , Rfl:  .  atorvastatin (LIPITOR) 20 MG tablet, Take 1 tablet (20 mg total) by mouth daily., Disp: 90 tablet, Rfl: 3 .  azithromycin (ZITHROMAX) 250 MG tablet, As directed, Disp: 6 tablet, Rfl: 3 .  beclomethasone (QVAR) 80 MCG/ACT inhaler, Inhale 2 puffs into the lungs every morning. Can use additional 2 puffs in the PM for increased wheezing/SOB, Disp: 1 Inhaler, Rfl: 6 .  cetirizine (ZYRTEC) 10 MG tablet, Take 10 mg by mouth daily. , Disp: , Rfl:  .  chlorpheniramine (CHLOR-TRIMETON) 4 MG tablet, Take 8 mg by mouth at bedtime., Disp: , Rfl:  .  ketotifen (ZADITOR) 0.025 % ophthalmic solution, Place 1 drop into both eyes as needed., Disp: , Rfl:  .  metoprolol (LOPRESSOR) 50 MG tablet, Take 1 tablet (50 mg total) by mouth 2 (two) times daily., Disp: 60 tablet, Rfl: 11 .  Multiple Vitamins-Minerals (PRESERVISION AREDS PO), Take 2 tablets by mouth every morning., Disp: , Rfl:  .  oxymetazoline (AFRIN) 0.05 % nasal spray, Place 1 spray into both nostrils 2 (two) times daily as needed for congestion. , Disp: , Rfl:  .  valACYclovir (VALTREX) 500 MG tablet, Take 500 mg by mouth as needed., Disp: , Rfl:  .  albuterol (PROAIR HFA) 108 (90 Base) MCG/ACT inhaler, Inhale 1 puff into the lungs every 6 (six) hours as needed for wheezing or shortness of breath., Disp: 1 Inhaler, Rfl: 6 .  doxycycline (VIBRA-TABS) 100 MG tablet, Take 1  tablet (100 mg total) by mouth 2 (two) times daily., Disp: 14 tablet, Rfl: 0 .  fluticasone (FLOVENT HFA) 110 MCG/ACT inhaler, Inhale 1 puff into the lungs 2 (two) times daily., Disp: 1 Inhaler, Rfl: 6   Past Medical History  Diagnosis Date  . Allergic     (sees Dr. Velora Heckler)  . Hyperlipidemia   . CAD (coronary artery disease)     sees Dr. Haroldine Laws   . Myocardial infarction ([place])   . S/P CABG (coronary artery bypass graft) 2007     Coronary artery bypass grafting x4 with left internal(Edward Servando Snare, MD)  . Carotid bruit     carotid u/s 10/10: 0.39% bilaterally  . Hx of colonic polyps     (sees Dr. Henrene Pastor)  . ED (erectile dysfunction)   . HTN (hypertension)   . Precancerous skin lesion     (sees Dr. Allyson Sabal)  . Osteoarthritis     especially in left knee, (sees Dr. Alphonzo Severance )  . Benign prostatic hypertrophy     (sees Dr. Denman George  . Obesity   . Gout   . GERD (gastroesophageal reflux disease)   . Prostate cancer ([place])   . Allergy   . Asthma     sees [name]     Allergies  Allergen Reactions  . Amlodipine     Swelling in ankles  . Peanut-Containing Drug Products Anaphylaxis  . Lisinopril     cough  . Prednisone     Constipation & "he could not sleep" **Can tolerate in certain situations**  . Sulfonamide Derivatives     REACTION: anaphylaxis         Objective:   Physical Exam  BP 102/64 mmHg  Pulse 71  Ht 5\' 7"  (1.702 m)  Wt 274 lb (124.286 kg)  BMI 42.90 kg/m2  SpO2 96%  Physical Exam:  General- No distress,  A&Ox3 ENT: No sinus tenderness, TM clear, pale nasal mucosa, no oral exudate,no post nasal drip, no LAN Cardiac: S1, S2, regular rate and rhythm, + murmur Chest: + wheeze/ rales/ dullness; rhonchi noted, clears with cough, no accessory muscle use, no nasal flaring, no sternal retractions Abd.: Soft Non-tender Ext: No clubbing cyanosis, edema Neuro:  normal strength Skin: No rashes, warm and dry Psych: normal mood and behavior        Assessment & Plan:

## 2015-10-05 NOTE — Telephone Encounter (Signed)
Per 10/05/15 OV: Patient Instructions       It is nice to meet you today. Chest x ray today We will call with results We will try the Flovent 110 1 puff twice daily.   We will add Pro Air as your rescue inhaler Use every 4-6 hours as neded for wheezing or shortness of breath. Let us know if that works well for you. Depo-Medrol injection today. We will start Doxycycline 100 mg twice daily x 7 days. Continue your probiotic with your antibiotic Make sure you swallow the doxycycline, and wash it down with extra water as the tablet can be irritating to the throat and esophagus. Follow up in 2 weeks to make sure you are better. Schedule with [name] first available to get you on schedule. Please contact office for sooner follow up if symptoms do not improve or worsen or seek emergency care   --  Called spoke with pt. He reports Judson Roch was going to call in Cheratussin cough syrup for him I do not see any mention in the note about this. Judson Roch is already gone. I advised will check with her in the AM. Please advise Judson Roch thanks

## 2015-10-05 NOTE — Assessment & Plan Note (Addendum)
Acute slow to resolve Flare./ continued wheezing Treated with Z-Pack previously Does not tolerate prednisone tapers well    CXR today Call patient with results Use every 4-6 hours as needed for wheezing or shortness of breath. Depo-Medrol 120 injection today. We will start Doxycycline 100 mg twice daily x 7 days. Continue your probiotic with your antibiotic Make sure you swallow the doxycycline, and wash it down with extra water as the tablet can be irritating to the throat and esophagus. Be mindful of sun exposure while on this medication. Follow up in 2 weeks to make sure you are better. Schedule with [name] first available to get you on schedule. Please contact office for sooner follow up if symptoms do not improve or worsen or seek emergency care

## 2015-10-05 NOTE — Assessment & Plan Note (Signed)
Continued allergies which impact asthma Plan: Continue Zyrtec daily as maintenance Call the office for flares

## 2015-10-05 NOTE — Assessment & Plan Note (Signed)
Pt. Has had insurance change.  QVAR no longer with good coverage on his plan.  Plan: Change to Flovent 110 one puff twice daily. Add Pro Air for rescue, every 4-6 hours for wheezing or shortness of breath,

## 2015-10-05 NOTE — Patient Instructions (Addendum)
It is nice to meet you today. Chest x ray today We will call with results We will try the Flovent 110 1 puff twice daily.  We will add Pro Air as your rescue inhaler Use every 4-6 hours as neded for wheezing or shortness of breath. Let us know if that works well for you. Depo-Medrol injection today. We will start Doxycycline 100 mg twice daily x 7 days. Continue your probiotic with your antibiotic Make sure you swallow the doxycycline, and wash it down with extra water as the tablet can be irritating to the throat and esophagus. Follow up in 2 weeks to make sure you are better. Schedule with [name] first available to get you on schedule. Please contact office for sooner follow up if symptoms do not improve or worsen or seek emergency care

## 2015-10-06 MED ORDER — HYDROCODONE-HOMATROPINE 5-1.5 MG/5ML PO SYRP: 5.0000 mL | ORAL_SOLUTION | Freq: Four times a day (QID) | ORAL | Status: DC | PRN

## 2015-10-06 NOTE — Telephone Encounter (Signed)
Called spoke with pt. Aware of recs below. RX has been printed and placed for pick up. Nothing further needed

## 2015-10-06 NOTE — Telephone Encounter (Signed)
Please order Hydromet 5 cc's by mouth every 6 hours for cough. Disp. 120 cc's This will make you sleepy. Do not drive if you are drowsy. Thanks so much.

## 2015-10-09 ENCOUNTER — Telehealth: Payer: Self-pay | Admitting: Cardiovascular Disease

## 2015-10-09 NOTE — Telephone Encounter (Signed)
.   New Message  Pt c/o of "extreme wheezing" for the past 3 months- stated that it has been getting worse the last 30 days- constant- disturbs sleep- Please call back and discuss.

## 2015-10-09 NOTE — Telephone Encounter (Signed)
Spoke with pt. He reports wheezing for last few months. Worse in last 30 days. Keeps him awake at night. Short of breath with exertion but this is not worsening. No increase in swelling in feet /ankles. Reports "normal" for him.  Is being treated by pulmonary.  He has follow up with pulmonary on 2/23.  I asked him to keep this appt and if pulmonary felt he needed to be seen by cardiology to have them contact us or he could contact us after appt.  Pt agreeable with this plan.

## 2015-10-19 ENCOUNTER — Ambulatory Visit (INDEPENDENT_AMBULATORY_CARE_PROVIDER_SITE_OTHER): Payer: Medicare Other | Admitting: Acute Care

## 2015-10-19 ENCOUNTER — Other Ambulatory Visit: Payer: Self-pay | Admitting: Acute Care

## 2015-10-19 ENCOUNTER — Encounter: Payer: Self-pay | Admitting: Acute Care

## 2015-10-19 VITALS — BP 136/64 | HR 70 | Ht 67.0 in | Wt 274.0 lb

## 2015-10-19 DIAGNOSIS — J309 Allergic rhinitis, unspecified: Secondary | ICD-10-CM

## 2015-10-19 DIAGNOSIS — J4520 Mild intermittent asthma, uncomplicated: Secondary | ICD-10-CM

## 2015-10-19 DIAGNOSIS — J209 Acute bronchitis, unspecified: Secondary | ICD-10-CM

## 2015-10-19 MED ORDER — HYDROCODONE-HOMATROPINE 5-1.5 MG/5ML PO SYRP: 5.0000 mL | ORAL_SOLUTION | Freq: Four times a day (QID) | ORAL | Status: DC | PRN

## 2015-10-19 NOTE — Progress Notes (Signed)
Subjective:    Patient ID: Justin [name], male    DOB: May 17, 1934, 82 y.o.   MRN: [id number]  HPI  82 year old male with known mild intermittent asthma and also upper airway cough related to postnasal drip and irritable larynx syndrome. Former Immunologist patient who is now seen by [facility]. Last seen in the office 10/05/15.  Significant Events/Procedures:  09/07/14: Spirometry mild airflow obstruction, and a reduced FVC probably due to centripetal obesity.   10/05/15 CXR:  EXAM: CHEST 2 VIEW  COMPARISON: 05/31/2014 chest radiograph.  FINDINGS: Sternotomy wires appear aligned and intact. Stable cardiomediastinal silhouette with mild cardiomegaly. No pneumothorax. Trace left pleural effusion. Cephalization of the pulmonary vasculature without overt pulmonary edema. No acute consolidative airspace disease.  IMPRESSION: 1. Stable mild cardiomegaly without overt pulmonary edema. 2. Trace left pleural effusion .  10/05/15: Acute Bronchitis Flare: Treated with Doxy 100 mg BID x 7 days Depo Medrol 120 mg injection.  10/19/15:Two Week Follow up OV:  Patient returns to the office today for follow up of acute bronchitis on 10/05/15. He is feeling much better. No fever, no longer having a productive cough, wheezing has cleared.He has responded well to treatment. He does have a residual cough that is his baseline. It is dry and has responded well to the Hydromet.He denies chest pain, orthopnea, hemoptysis or leg pain. He has no other complaints.   Current outpatient prescriptions:  .  albuterol (PROAIR HFA) 108 (90 Base) MCG/ACT inhaler, Inhale 1 puff into the lungs every 6 (six) hours as needed for wheezing or shortness of breath., Disp: 1 Inhaler, Rfl: 6 .  aspirin 81 MG tablet, Take 81 mg by mouth daily., Disp: , Rfl:  .  atorvastatin (LIPITOR) 20 MG tablet, Take 1 tablet (20 mg total) by mouth daily., Disp: 90 tablet, Rfl: 3 .  beclomethasone (QVAR) 80 MCG/ACT inhaler, Inhale 2 puffs into  the lungs every morning. Can use additional 2 puffs in the PM for increased wheezing/SOB, Disp: 1 Inhaler, Rfl: 6 .  cetirizine (ZYRTEC) 10 MG tablet, Take 10 mg by mouth daily. , Disp: , Rfl:  .  chlorpheniramine (CHLOR-TRIMETON) 4 MG tablet, Take 8 mg by mouth at bedtime., Disp: , Rfl:  .  HYDROcodone-homatropine (HYDROMET) 5-1.5 MG/5ML syrup, Take 5 mLs by mouth every 6 (six) hours as needed for cough., Disp: 240 mL, Rfl: 0 .  ketotifen (ZADITOR) 0.025 % ophthalmic solution, Place 1 drop into both eyes as needed., Disp: , Rfl:  .  metoprolol (LOPRESSOR) 50 MG tablet, Take 1 tablet (50 mg total) by mouth 2 (two) times daily., Disp: 60 tablet, Rfl: 11 .  Multiple Vitamins-Minerals (PRESERVISION AREDS PO), Take 2 tablets by mouth every morning., Disp: , Rfl:  .  oxymetazoline (AFRIN) 0.05 % nasal spray, Place 1 spray into both nostrils 2 (two) times daily as needed for congestion. , Disp: , Rfl:  .  valACYclovir (VALTREX) 500 MG tablet, Take 500 mg by mouth as needed., Disp: , Rfl:  .  fluticasone (FLOVENT HFA) 110 MCG/ACT inhaler, Inhale 1 puff into the lungs 2 (two) times daily. (Patient not taking: Reported on 10/19/2015), Disp: 1 Inhaler, Rfl: 6   Past Medical History  Diagnosis Date  . Allergic     (sees Dr. Velora Heckler)  . Hyperlipidemia   . CAD (coronary artery disease)     sees Dr. Haroldine Laws   . Myocardial infarction ([place])   . S/P CABG (coronary artery bypass graft) 2007     Coronary artery  bypass grafting x4 with left internal(Edward Servando Snare, MD)  . Carotid bruit     carotid u/s 10/10: 0.39% bilaterally  . Hx of colonic polyps     (sees Dr. Henrene Pastor)  . ED (erectile dysfunction)   . HTN (hypertension)   . Precancerous skin lesion     (sees Dr. Allyson Sabal)  . Osteoarthritis     especially in left knee, (sees Dr. Alphonzo Severance )  . Benign prostatic hypertrophy     (sees Dr. Denman George  . Obesity   . Gout   . GERD (gastroesophageal reflux disease)   . Prostate cancer ([place])   . Allergy     . Asthma     sees [name]     Allergies  Allergen Reactions  . Amlodipine     Swelling in ankles  . Peanut-Containing Drug Products Anaphylaxis  . Lisinopril     cough  . Prednisone     Constipation & "he could not sleep" **Can tolerate in certain situations**  . Sulfonamide Derivatives     REACTION: anaphylaxis        Review of Systems   Constitutional:   No  weight loss, night sweats,  Fevers, chills, fatigue, or  lassitude.  HEENT:   No headaches,  Difficulty swallowing,  Tooth/dental problems, or  Sore throat,                No sneezing, itching, ear ache, nasal congestion, post nasal drip,   CV:  No chest pain,  Orthopnea, PND, swelling in lower extremities, anasarca, dizziness, palpitations, syncope.   GI  No heartburn, indigestion, abdominal pain, nausea, vomiting, diarrhea, change in bowel habits, loss of appetite, bloody stools.   Resp: No shortness of breath with exertion or at rest.  No excess mucus, no productive cough,  + non-productive cough,  No coughing up of blood.  No change in color of mucus.  No wheezing.  No chest wall deformity  Skin: no rash or lesions.  GU: no dysuria, change in color of urine, no urgency or frequency.  No flank pain, no hematuria   MS:  No joint pain or swelling.  No decreased range of motion.  No back pain.  Psych:  No change in mood or affect. No depression or anxiety.  No memory loss.         Objective:   Physical Exam  BP 136/64 mmHg  Pulse 70  Ht 5\' 7"  (1.702 m)  Wt 274 lb (124.286 kg)  BMI 42.90 kg/m2  SpO2 98%  Physical Exam:  General- No distress,  A&Ox3 ENT: No sinus tenderness, TM clear, pale nasal mucosa, no oral exudate,no post nasal drip, no LAN Cardiac: S1, S2, regular rate and rhythm, no murmur Chest: No wheeze/ rales/ dullness; no accessory muscle use, no nasal flaring, no sternal retractions Abd.: Soft Non-tender Ext: No clubbing cyanosis, trace lower extremity edema Neuro:  normal  strength Skin: No rashes, warm and dry Psych: normal mood and behavior     Assessment & Plan:

## 2015-10-19 NOTE — Patient Instructions (Addendum)
It is nice to see you again today. I am glad you are feeling so much better. We will give you a prescription for a refill on your hydromet. Follow up with [name]  On 12/14/15 as is already scheduled. Please contact office for sooner follow up if symptoms do not improve or worsen or seek emergency care

## 2015-10-19 NOTE — Assessment & Plan Note (Signed)
Has not started Flovent 110 yet as he still had some QVAR doses left.  Plan: Start Flovent 110 when you are finished with QVAR. Follow up with [place] and let us know how it is working for you. Appointment with [name] 12/14/15. Call sooner if you need to be seen prior to that date.

## 2015-10-19 NOTE — Assessment & Plan Note (Signed)
Potential for seasonal allergy flare which impacts chronic asthma  Plan: Zyrtec daily as maintenance Flonase nasal spray  Call if this does not control your symptoms

## 2015-10-19 NOTE — Assessment & Plan Note (Signed)
Improved after treatment with Doxycycline 100 mg BID x 7 days and Depo Medrol 120 mg. Injection Wheezing has cleared. Productive cough has resolved. Plan:  No need for further treatment. Hydromet syrup renewed for dry non-productive cough. Follow up with [name] 12/14/15 as is already scheduled Please contact office for sooner follow up if symptoms do not improve or worsen or seek emergency care

## 2015-10-19 NOTE — Progress Notes (Signed)
Chart reviewed, seems to be recovering from asthma/bronchitis flare after doxycycline and solumedrol.  Agree with the treatment plan outlined above.

## 2015-10-25 ENCOUNTER — Other Ambulatory Visit: Payer: Self-pay | Admitting: Pulmonary Disease

## 2015-10-25 MED ORDER — FLUTICASONE PROPIONATE HFA 110 MCG/ACT IN AERO: 1.0000 | INHALATION_SPRAY | Freq: Two times a day (BID) | RESPIRATORY_TRACT | Status: DC

## 2015-10-25 NOTE — Telephone Encounter (Signed)
Per [name], pt was to be on Flovent 110, 1 puff, BID. Pharmacy sent request for clarification because pt states that he has been taking 2 puffs BID. Refill sent to pharmacy with correct dosage instructions.            Justin [name]  07/13/2015  Telephone  MRN:  [id number]   Description: 82 year old male  Provider: Juanito Doom, MD  Department: [facility]       Reason for Call     med question         Call Documentation      Glean Hess, CMA at 07/17/2015 9:39 AM     Status: Signed       Expand All Collapse All   Advised patient of Dr. Anastasia Pall change in medications Patient says to send to pharmacy on hold until he needs it. Rx sent to pharmacy. Nothing further needed. Closing encounter             Juanito Doom, MD at 07/17/2015 7:23 AM     Status: Signed       Expand All Collapse All   Flovent 110 mcg 1 puff bid            [name], CMA at 07/13/2015 11:03 AM     Status: Signed       Expand All Collapse All   Spoke with pt. He received a letter stating QVAR is not on 2017 formulary. The covered alternatives are arnuity, flovent, incruse, spiriva and serevent diskus. Pt has a recall in chart to see [name] in May 2017. Please advise [name] thanks             Encounter MyChart Messages     No messages in this encounter     Routing History     Priority Sent On From To Message Type     07/17/2015 7:24 AM Juanito Doom, MD P [facility] TRIAGE POOL      07/13/2015 11:04 AM Mindy Elza R[place], [place], MD      07/13/2015 10:30 AM Otelia Limes Par[place] P [facility] TRIAGE POOL       Created by     Jamse Arn on 07/13/2015 10:29 AM     Approved      Disp Refills Start End    fluticasone (FLOVENT HFA) 110 MCG/ACT inhaler 1 Inhaler 12 07/17/2015 08/07/2015    Sig - Route:  Inhale 1 puff into the lungs 2 (two) times daily. - Inhalation    Class:  Normal     Comment:  Please hold until patient needs refill.. Thanks.    Authorizing Provider:  Juanito Doom, MD    Ordering User:  Glean Hess, [place]     Contacts       Type Contact Phone    07/13/2015 10:29 AM Phone (Incoming) Vlad, Asai (Self) [phone] (M)    Using Qvar and this is not on his 2017 drug list. Wanting to see what options are.

## 2015-11-15 ENCOUNTER — Telehealth: Payer: Self-pay | Admitting: Family Medicine

## 2015-11-15 NOTE — Telephone Encounter (Signed)
Call in a Zpack  ?

## 2015-11-15 NOTE — Telephone Encounter (Signed)
Pt call to ask if a zpack can be called for him. Cough and chest congestion     Pharmacy ; [facility]

## 2015-11-16 MED ORDER — AZITHROMYCIN 250 MG PO TABS: ORAL_TABLET | ORAL | Status: DC

## 2015-11-16 NOTE — Telephone Encounter (Signed)
Rx was sent to the the pharmacy

## 2015-11-17 DIAGNOSIS — C61 Malignant neoplasm of prostate: Secondary | ICD-10-CM | POA: Diagnosis not present

## 2015-11-23 ENCOUNTER — Ambulatory Visit: Payer: Medicare Other | Admitting: Pulmonary Disease

## 2015-11-24 DIAGNOSIS — Z0000 Encounter for general adult medical examination without abnormal findings: Secondary | ICD-10-CM | POA: Diagnosis not present

## 2015-11-24 DIAGNOSIS — C61 Malignant neoplasm of prostate: Secondary | ICD-10-CM | POA: Diagnosis not present

## 2015-12-07 DIAGNOSIS — H43813 Vitreous degeneration, bilateral: Secondary | ICD-10-CM | POA: Diagnosis not present

## 2015-12-07 DIAGNOSIS — H353112 Nonexudative age-related macular degeneration, right eye, intermediate dry stage: Secondary | ICD-10-CM | POA: Diagnosis not present

## 2015-12-07 DIAGNOSIS — H353122 Nonexudative age-related macular degeneration, left eye, intermediate dry stage: Secondary | ICD-10-CM | POA: Diagnosis not present

## 2015-12-14 ENCOUNTER — Other Ambulatory Visit: Payer: Medicare Other

## 2015-12-14 ENCOUNTER — Ambulatory Visit (INDEPENDENT_AMBULATORY_CARE_PROVIDER_SITE_OTHER): Payer: Medicare Other | Admitting: Pulmonary Disease

## 2015-12-14 ENCOUNTER — Encounter: Payer: Self-pay | Admitting: Pulmonary Disease

## 2015-12-14 VITALS — BP 142/62 | HR 62 | Ht 67.0 in | Wt 285.2 lb

## 2015-12-14 DIAGNOSIS — J3089 Other allergic rhinitis: Secondary | ICD-10-CM

## 2015-12-14 DIAGNOSIS — J387 Other diseases of larynx: Secondary | ICD-10-CM

## 2015-12-14 MED ORDER — MONTELUKAST SODIUM 10 MG PO TABS
10.0000 mg | ORAL_TABLET | Freq: Every day | ORAL | Status: DC
Start: 2015-12-14 — End: 2017-01-06

## 2015-12-14 NOTE — Assessment & Plan Note (Signed)
As noted above, I believe that allergic rhinitis is contributing to his significant laryngeal symptoms. He has had refractory allergic rhinitis in the past and currently is having symptoms despite taking a good antihistamine and compliance with a nasal steroid.  His allergic rhinitis has been refractory to immunotherapy in the past  Plan: Check serum IgE, consider Xolair Continue antihistamine and nasal steroid Add Singulair

## 2015-12-14 NOTE — Patient Instructions (Signed)
Take montelukast daily in addition to your other medications We will call you with the results of your bloodwork I will see you back in 3 months or sooner if needed

## 2015-12-14 NOTE — Progress Notes (Signed)
Subjective:    Patient ID: Justin [name], male    DOB: 24-Dec-1933, 82 y.o.   MRN: [id number]  Synopsis: Former patient of Dr. Gwenette Greet who has irritable larynx syndrome as well as mild persistent asthma.  HPI  Chief Complaint  Patient presents with  . Follow-up    former [facility] pt transferring care, being treated for asthma.  Pt c/o wheezing, nonprod cough X6-8 wks.  Cough is worse when talking.      Justin [name] is a patient of Dr. Janifer Adie who has irritable larynx and asthma.  He has seen our nurse practitioner here a couple of times in the last year for a bronchitis flare.  He has been using Hall's vitamin C fairly regularly to help controlled the cough.  He notes that he is still wheezing, he says enough to wake up his wife at night.  He will occasionally have dyspnea if he moves too much.  He does not exercise much.  He notes significant allergy problems with sinus congestion.  He notes that when he wakes up in the mornings he has a lot of clear drainage from his nose.  This lasts nearly all day.   Past Medical History  Diagnosis Date  . Allergic     (sees Dr. Velora Heckler)  . Hyperlipidemia   . CAD (coronary artery disease)     sees Dr. Haroldine Laws   . Myocardial infarction ([place])   . S/P CABG (coronary artery bypass graft) 2007     Coronary artery bypass grafting x4 with left internal(Edward Servando Snare, MD)  . Carotid bruit     carotid u/s 10/10: 0.39% bilaterally  . Hx of colonic polyps     (sees Dr. Henrene Pastor)  . ED (erectile dysfunction)   . HTN (hypertension)   . Precancerous skin lesion     (sees Dr. Allyson Sabal)  . Osteoarthritis     especially in left knee, (sees Dr. Alphonzo Severance )  . Benign prostatic hypertrophy     (sees Dr. Denman George  . Obesity   . Gout   . GERD (gastroesophageal reflux disease)   . Prostate cancer ([place])   . Allergy   . Asthma     sees [name]      Review of Systems  Constitutional: Negative for fever, chills and fatigue.  HENT: Negative for  postnasal drip, rhinorrhea and sinus pressure.   Respiratory: Positive for cough and shortness of breath. Negative for wheezing.   Cardiovascular: Negative for chest pain, palpitations and leg swelling.       Objective:   Physical Exam  Filed Vitals:   12/14/15 0910  BP: 142/62  Pulse: 62  Height: 5\' 7"  (1.702 m)  Weight: 285 lb 3.2 oz (129.366 kg)  SpO2: 97%    RA  Gen: obese but well appearing HENT: OP clear, TM's clear, neck supple PULM: CTA B, normal percussion CV: RRR, no mgr, trace edema GI: BS+, soft, nontender Derm: no cyanosis or rash Psyche: normal mood and affect   2015 chest x-ray images personally showing sternal wires and cardiomegaly but no pulmonary parenchymal infiltrate  Records from my partner reviewed were he was treated for allergic rhinitis, irritable larynx syndrome, and asthma.      Assessment & Plan:  Asthma, intermittent Aside from the recent episode of bronchitis it sounds as if this has been a stable interval for him. He does describe wheezing but it is an audible upper airway wheeze associated with significant sinus congestion. He only notes minimal shortness  of breath issues which is likely related to deconditioning and obesity. On exam today his lungs are clear. Significantly, in the past his chest x-ray and spirometry findings have not shown significant evidence of lung disease.  So in summary, I think his asthma is quite mild and most of the wheezing and symptoms he been experiencing are related to upper airway wheeze and allergic rhinitis.  Plan: Continue Flovent  Irritable larynx syndrome As noted above, I believe that allergic rhinitis is contributing to his significant laryngeal symptoms. He has had refractory allergic rhinitis in the past and currently is having symptoms despite taking a good antihistamine and compliance with a nasal steroid.  His allergic rhinitis has been refractory to immunotherapy in the past  Plan: Check  serum IgE, consider Xolair Continue antihistamine and nasal steroid Add Singulair     Current outpatient prescriptions:  .  albuterol (PROAIR HFA) 108 (90 Base) MCG/ACT inhaler, Inhale 1 puff into the lungs every 6 (six) hours as needed for wheezing or shortness of breath., Disp: 1 Inhaler, Rfl: 6 .  aspirin 81 MG tablet, Take 81 mg by mouth daily., Disp: , Rfl:  .  atorvastatin (LIPITOR) 20 MG tablet, Take 1 tablet (20 mg total) by mouth daily., Disp: 90 tablet, Rfl: 3 .  cetirizine (ZYRTEC) 10 MG tablet, Take 10 mg by mouth daily. , Disp: , Rfl:  .  diphenhydrAMINE (SOMINEX) 25 MG tablet, Take 50 mg by mouth at bedtime as needed for sleep., Disp: , Rfl:  .  fluticasone (FLOVENT HFA) 110 MCG/ACT inhaler, Inhale 1 puff into the lungs 2 (two) times daily., Disp: 1 Inhaler, Rfl: 6 .  metoprolol (LOPRESSOR) 50 MG tablet, Take 1 tablet (50 mg total) by mouth 2 (two) times daily., Disp: 60 tablet, Rfl: 11 .  Multiple Vitamins-Minerals (PRESERVISION AREDS PO), Take 2 tablets by mouth every morning., Disp: , Rfl:  .  oxymetazoline (AFRIN) 0.05 % nasal spray, Place 1 spray into both nostrils 2 (two) times daily as needed for congestion. Reported on 12/14/2015, Disp: , Rfl:  .  Probiotic Product (ALIGN) 4 MG CAPS, Take 1 capsule by mouth daily., Disp: , Rfl:  .  triamcinolone (NASACORT ALLERGY 24HR) 55 MCG/ACT AERO nasal inhaler, Place 2 sprays into the nose daily., Disp: , Rfl:  .  montelukast (SINGULAIR) 10 MG tablet, Take 1 tablet (10 mg total) by mouth at bedtime., Disp: 30 tablet, Rfl: 11 .  valACYclovir (VALTREX) 500 MG tablet, Take 500 mg by mouth as needed., Disp: , Rfl:

## 2015-12-14 NOTE — Assessment & Plan Note (Signed)
Aside from the recent episode of bronchitis it sounds as if this has been a stable interval for him. He does describe wheezing but it is an audible upper airway wheeze associated with significant sinus congestion. He only notes minimal shortness of breath issues which is likely related to deconditioning and obesity. On exam today his lungs are clear. Significantly, in the past his chest x-ray and spirometry findings have not shown significant evidence of lung disease.  So in summary, I think his asthma is quite mild and most of the wheezing and symptoms he been experiencing are related to upper airway wheeze and allergic rhinitis.  Plan: Continue Flovent

## 2015-12-15 ENCOUNTER — Ambulatory Visit (INDEPENDENT_AMBULATORY_CARE_PROVIDER_SITE_OTHER): Payer: Medicare Other | Admitting: Podiatry

## 2015-12-15 ENCOUNTER — Encounter: Payer: Self-pay | Admitting: Podiatry

## 2015-12-15 DIAGNOSIS — B351 Tinea unguium: Secondary | ICD-10-CM | POA: Diagnosis not present

## 2015-12-15 DIAGNOSIS — M79676 Pain in unspecified toe(s): Secondary | ICD-10-CM | POA: Diagnosis not present

## 2015-12-15 LAB — IGE: IgE (Immunoglobulin E), Serum: 223 kU/L — ABNORMAL HIGH (ref <115)

## 2015-12-15 NOTE — Progress Notes (Signed)
Patient ID: Justin [name], male   DOB: 1934-05-30, 82 y.o.   MRN: [id number] Complaint:  Visit Type: Patient returns to my office for continued preventative foot care services. Complaint: Patient states" my nails have grown long and thick and become painful to walk and wear shoes" . The patient presents for preventative foot care services. No changes to ROS  Podiatric Exam: Vascular: dorsalis pedis and posterior tibial pulses are palpable bilateral. Capillary return is immediate. Temperature gradient is WNL. Skin turgor WNL  Sensorium: Normal Semmes Weinstein monofilament test. Normal tactile sensation bilaterally. Nail Exam: Pt has thick disfigured discolored nails with subungual debris noted bilateral entire nail hallux through fifth toenails Ulcer Exam: There is no evidence of ulcer or pre-ulcerative changes or infection. Orthopedic Exam: Muscle tone and strength are WNL. No limitations in general ROM. No crepitus or effusions noted. Foot type and digits show no abnormalities. Bony prominences are unremarkable. Skin: No Porokeratosis. No infection or ulcers  Diagnosis:  Onychomycosis, , Pain in right toe, pain in left toes  Treatment & Plan Procedures and Treatment: Consent by patient was obtained for treatment procedures. The patient understood the discussion of treatment and procedures well. All questions were answered thoroughly reviewed. Debridement of mycotic and hypertrophic toenails, 1 through 5 bilateral and clearing of subungual debris. No ulceration, no infection noted.  Return Visit-Office Procedure: Patient instructed to return to the office for a follow up visit 3 months for continued evaluation and treatment.   Gardiner Barefoot DPM

## 2016-01-05 ENCOUNTER — Ambulatory Visit (INDEPENDENT_AMBULATORY_CARE_PROVIDER_SITE_OTHER): Payer: Medicare Other | Admitting: Adult Health

## 2016-01-05 ENCOUNTER — Ambulatory Visit (INDEPENDENT_AMBULATORY_CARE_PROVIDER_SITE_OTHER)
Admission: RE | Admit: 2016-01-05 | Discharge: 2016-01-05 | Disposition: A | Discharge status: Home / Self Care | Payer: Medicare Other | Source: Ambulatory Visit | Attending: Adult Health | Admitting: Adult Health

## 2016-01-05 ENCOUNTER — Encounter: Payer: Self-pay | Admitting: Adult Health

## 2016-01-05 VITALS — BP 138/78 | HR 59 | Temp 97.6°F | Ht 67.0 in | Wt 283.0 lb

## 2016-01-05 DIAGNOSIS — J387 Other diseases of larynx: Secondary | ICD-10-CM | POA: Diagnosis not present

## 2016-01-05 DIAGNOSIS — R053 Chronic cough: Secondary | ICD-10-CM

## 2016-01-05 DIAGNOSIS — R05 Cough: Secondary | ICD-10-CM

## 2016-01-05 DIAGNOSIS — R0981 Nasal congestion: Secondary | ICD-10-CM | POA: Diagnosis not present

## 2016-01-05 DIAGNOSIS — J4521 Mild intermittent asthma with (acute) exacerbation: Secondary | ICD-10-CM

## 2016-01-05 MED ORDER — BENZONATATE 200 MG PO CAPS: 200.0000 mg | ORAL_CAPSULE | Freq: Three times a day (TID) | ORAL | Status: DC | PRN

## 2016-01-05 NOTE — Patient Instructions (Addendum)
Begin Delsym 2 tsp Twice daily  .  Tessalon Three times a day  As needed  Cough .  Begin Zantac 75mg  Twice daily  .  Chest xray today  Set up for CT sinus .  Work on not coughing , or throat clearing with sips of water and non mint candy .  Continue on Nasacort , Singular, Flovent and Zyrtec.  follow up [name] in 2 months as planned and As needed   Please contact office for sooner follow up if symptoms do not improve or worsen or seek emergency care

## 2016-01-05 NOTE — Assessment & Plan Note (Signed)
Mild flare with chronic cough  Check CT sinus and CXR  Cough and trigger control regimen   Plan  Begin Delsym 2 tsp Twice daily  .  Tessalon Three times a day  As needed  Cough .  Begin Zantac 75mg  Twice daily  .  Chest xray today  Set up for CT sinus .  Work on not coughing , or throat clearing with sips of water and non mint candy .  Continue on Nasacort , Singular, Flovent and Zyrtec.  follow up Justin [name] in 2 months as planned and As needed   Please contact office for sooner follow up if symptoms do not improve or worsen or seek emergency care

## 2016-01-05 NOTE — Progress Notes (Signed)
Reviewed, I agree with this plan of care 

## 2016-01-05 NOTE — Progress Notes (Signed)
Quick Note:  Called spoke with pt. Reviewed results and recs. Pt voiced understanding and had no further questions. ______ 

## 2016-01-05 NOTE — Progress Notes (Signed)
Subjective:    Patient ID: Justin [name], male    DOB: 1933-11-04, 82 y.o.   MRN: [id number]  HPI 82 year old male followed for mild persistent asthma and irritable larynx syndrome  01/05/2016 Acute OV  Patient returns for persistent symptoms. Patient has a history of irritable larynx syndrome with upper airway cough.  Was treated for flare in Feb, says he really has not gotten over this and cough is interfering with daily work.  He remains on Nasacort, Singulair, Flovent and zyrtec on consistent basis.  Not using anything for cough except for vitamin c drops.  Cough is not interferring with sleep .  He does have constant sinus drip . Ige was elevated last ov at ~223.  Has been seen by allergist in past.  He denies chest pain, orthopnea, PND, increased leg swelling, fever, or discolored mucus.  Current Outpatient Prescriptions on File Prior to Visit  Medication Sig Dispense Refill  . albuterol (PROAIR HFA) 108 (90 Base) MCG/ACT inhaler Inhale 1 puff into the lungs every 6 (six) hours as needed for wheezing or shortness of breath. 1 Inhaler 6  . aspirin 81 MG tablet Take 81 mg by mouth daily.    [name] atorvastatin (LIPITOR) 20 MG tablet Take 1 tablet (20 mg total) by mouth daily. 90 tablet 3  . cetirizine (ZYRTEC) 10 MG tablet Take 10 mg by mouth daily.     . diphenhydrAMINE (SOMINEX) 25 MG tablet Take 50 mg by mouth at bedtime as needed for sleep.    . fluticasone (FLOVENT HFA) 110 MCG/ACT inhaler Inhale 1 puff into the lungs 2 (two) times daily. 1 Inhaler 6  . metoprolol (LOPRESSOR) 50 MG tablet Take 1 tablet (50 mg total) by mouth 2 (two) times daily. 60 tablet 11  . montelukast (SINGULAIR) 10 MG tablet Take 1 tablet (10 mg total) by mouth at bedtime. 30 tablet 11  . Multiple Vitamins-Minerals (PRESERVISION AREDS PO) Take 2 tablets by mouth every morning.    [name] oxymetazoline (AFRIN) 0.05 % nasal spray Place 1 spray into both nostrils 2 (two) times daily as needed for congestion.  Reported on 12/14/2015    . Probiotic Product (ALIGN) 4 MG CAPS Take 1 capsule by mouth daily.    [name] triamcinolone (NASACORT ALLERGY 24HR) 55 MCG/ACT AERO nasal inhaler Place 2 sprays into the nose daily.    . valACYclovir (VALTREX) 500 MG tablet Take 500 mg by mouth as needed.     No current facility-administered medications on file prior to visit.     Review of Systems Constitutional:   No  weight loss, night sweats,  Fevers, chills, + fatigue, or  lassitude.  HEENT:   No headaches,  Difficulty swallowing,  Tooth/dental problems, or  Sore throat,                No sneezing, itching, ear ache,  +nasal congestion, post nasal drip,   CV:  No chest pain,  Orthopnea, PND, swelling in lower extremities, anasarca, dizziness, palpitations, syncope.   GI  No heartburn, indigestion, abdominal pain, nausea, vomiting, diarrhea, change in bowel habits, loss of appetite, bloody stools.   Resp:  No chest wall deformity  Skin: no rash or lesions.  GU: no dysuria, change in color of urine, no urgency or frequency.  No flank pain, no hematuria   MS:  No joint pain or swelling.  No decreased range of motion.  No back pain.  Psych:  No change in mood or affect. No depression or  anxiety.  No memory loss.         Objective:   Physical Exam Filed Vitals:   01/05/16 0957  BP: 138/78  Pulse: 59  Temp: 97.6 F (36.4 C)  TempSrc: Oral  Height: 5\' 7"  (1.702 m)  Weight: 283 lb (128.368 kg)  SpO2: 97%   GEN: A/Ox3; pleasant , NAD, elderly , obese   HEENT:  [place]/AT,  EACs-clear, TMs-wnl, NOSE-clear drainge , THROAT-clear, no lesions, no postnasal drip or exudate noted.   NECK:  Supple w/ fair ROM; no JVD; normal carotid impulses w/o bruits; no thyromegaly or nodules palpated; no lymphadenopathy.  RESP  Clear  P & A; w/o, wheezes/ rales/ or rhonchi.no accessory muscle use, no dullness to percussion  CARD:  RRR, no m/r/g  ,tr  peripheral edema, pulses intact, no cyanosis or clubbing.  GI:    Soft & nt; nml bowel sounds; no organomegaly or masses detected.  Musco: Warm bil, no deformities or joint swelling noted.   Neuro: alert, no focal deficits noted.    Skin: Warm, no lesions or rashes  Bridget Westbrooks NP-C  [facility]  01/05/2016       Assessment & Plan:

## 2016-01-05 NOTE — Assessment & Plan Note (Signed)
Begin Delsym 2 tsp Twice daily  .  Tessalon Three times a day  As needed  Cough .  Begin Zantac 75mg  Twice daily  .  Chest xray today  Set up for CT sinus .  Work on not coughing , or throat clearing with sips of water and non mint candy .  Continue on Nasacort , Singular, Flovent and Zyrtec.  follow up Justin [name] in 2 months as planned and As needed   Please contact office for sooner follow up if symptoms do not improve or worsen or seek emergency care

## 2016-01-08 ENCOUNTER — Ambulatory Visit (INDEPENDENT_AMBULATORY_CARE_PROVIDER_SITE_OTHER)
Admission: RE | Admit: 2016-01-08 | Discharge: 2016-01-08 | Disposition: A | Discharge status: Home / Self Care | Payer: Medicare Other | Source: Ambulatory Visit | Attending: Adult Health | Admitting: Adult Health

## 2016-01-08 DIAGNOSIS — R05 Cough: Secondary | ICD-10-CM

## 2016-01-08 DIAGNOSIS — R0981 Nasal congestion: Secondary | ICD-10-CM

## 2016-01-08 DIAGNOSIS — R053 Chronic cough: Secondary | ICD-10-CM

## 2016-01-09 NOTE — Progress Notes (Signed)
Quick Note:  Called spoke with pt. Reviewed results and recs. Pt voiced understanding and had no further questions. ______ 

## 2016-01-19 DIAGNOSIS — Z961 Presence of intraocular lens: Secondary | ICD-10-CM | POA: Diagnosis not present

## 2016-01-19 DIAGNOSIS — H52203 Unspecified astigmatism, bilateral: Secondary | ICD-10-CM | POA: Diagnosis not present

## 2016-01-28 ENCOUNTER — Other Ambulatory Visit: Payer: Self-pay | Admitting: Cardiovascular Disease

## 2016-02-15 DIAGNOSIS — L820 Inflamed seborrheic keratosis: Secondary | ICD-10-CM | POA: Diagnosis not present

## 2016-02-15 DIAGNOSIS — L821 Other seborrheic keratosis: Secondary | ICD-10-CM | POA: Diagnosis not present

## 2016-03-15 ENCOUNTER — Ambulatory Visit: Payer: Medicare Other | Admitting: Podiatry

## 2016-03-15 ENCOUNTER — Ambulatory Visit (INDEPENDENT_AMBULATORY_CARE_PROVIDER_SITE_OTHER): Payer: Medicare Other | Admitting: Pulmonary Disease

## 2016-03-15 ENCOUNTER — Encounter: Payer: Self-pay | Admitting: Pulmonary Disease

## 2016-03-15 ENCOUNTER — Ambulatory Visit: Payer: Medicare Other | Admitting: Pulmonary Disease

## 2016-03-15 VITALS — BP 142/72 | HR 69 | Ht 67.0 in | Wt 283.0 lb

## 2016-03-15 DIAGNOSIS — J387 Other diseases of larynx: Secondary | ICD-10-CM

## 2016-03-15 DIAGNOSIS — J4521 Mild intermittent asthma with (acute) exacerbation: Secondary | ICD-10-CM

## 2016-03-15 MED ORDER — TRAMADOL HCL 50 MG PO TABS: 50.0000 mg | ORAL_TABLET | Freq: Four times a day (QID) | ORAL | Status: DC | PRN

## 2016-03-15 NOTE — Patient Instructions (Signed)
You need to try to suppress your cough to allow your larynx (voice box) to heal.  For three days don't talk, laugh, sing, or clear your throat. Do everything you can to suppress the cough during this time. Use hard candies (sugarless Jolly Ranchers) or non-mint or non-menthol containing cough drops during this time to soothe your throat.  Use a cough suppressant (Delsym or what I have prescribed you) around the clock during this time.  After three days, gradually increase the use of your voice and back off on the cough suppressants. Use tramadol every 6 hours during the 3 days of cough suppression. Take the first dose at home to make sure that it does not make you excessively sleepy. Keep taking your other medicines as you're doing I will see you back in 2 months or sooner if needed

## 2016-03-15 NOTE — Assessment & Plan Note (Signed)
He has persistent cough despite having well controlled postnasal drip and acid reflux. His asthma has been stable recently. I believe that this is solely due to irritable larynx syndrome or excessive irritation of his larynx from ongoing coughing and talking.  Today I stressed the importance of voice rest. I told him that he needs to give himself 3 days of complete voice rest, cough suppression, and no throat clearing with tramadol use around-the-clock to help suppress his cough.

## 2016-03-15 NOTE — Progress Notes (Signed)
Subjective:    Patient ID: Justin [name], male    DOB: June 30, 1934, 82 y.o.   MRN: [id number]  Synopsis: Former patient of Dr. Gwenette Greet who has irritable larynx syndrome as well as mild persistent asthma.' May 2017 CT scan of the sinuses was a poor study, there is no clear acute or chronic sinusitis noted.  HPI  Chief Complaint  Patient presents with  . Follow-up    Pt c/o continued "throaty" cough that is worse when talking. Pt states that is has become quite a "nuisance". Pt wants to know if it could be a result of one of his medications. Pt denies cough when sleeping but does notice some in the early morning. Pt has not noticed any difference with reflux medications.     Justin [name] says that he still has dry cough.  He says that it still is bothering him mostly at work.  He has been working so much it is hard for him to rest his voice.  He says that talking on a phone will make it worse or any excessive talking will make it worse.  He has not had much sinus congestion lately.  No heartburn or acid reflux lately, he has been taking his heart burn pills.  He will occasionally burp.   Past Medical History  Diagnosis Date  . Allergic     (sees Dr. Velora Heckler)  . Hyperlipidemia   . CAD (coronary artery disease)     sees Dr. Haroldine Laws   . Myocardial infarction ([place])   . S/P CABG (coronary artery bypass graft) 2007     Coronary artery bypass grafting x4 with left internal(Edward Servando Snare, MD)  . Carotid bruit     carotid u/s 10/10: 0.39% bilaterally  . Hx of colonic polyps     (sees Dr. Henrene Pastor)  . ED (erectile dysfunction)   . HTN (hypertension)   . Precancerous skin lesion     (sees Dr. Allyson Sabal)  . Osteoarthritis     especially in left knee, (sees Dr. Alphonzo Severance )  . Benign prostatic hypertrophy     (sees Dr. Denman George  . Obesity   . Gout   . GERD (gastroesophageal reflux disease)   . Prostate cancer ([place])   . Allergy   . Asthma     sees [name]      Review of Systems    Constitutional: Negative for fever, chills and fatigue.  HENT: Negative for postnasal drip, rhinorrhea and sinus pressure.   Respiratory: Positive for cough and shortness of breath. Negative for wheezing.   Cardiovascular: Negative for chest pain, palpitations and leg swelling.       Objective:   Physical Exam  Filed Vitals:   03/15/16 1612  BP: 142/72  Pulse: 69  Height: 5\' 7"  (1.702 m)  Weight: 283 lb (128.368 kg)  SpO2: 96%    RA  Gen: obese but well appearing HENT: OP clear, TM's clear, neck supple PULM: CTA B, normal percussion CV: RRR, no mgr, trace edema GI: BS+, soft, nontender Derm: no cyanosis or rash Psyche: normal mood and affect   Records from his last visit with [place] were reviewed where he was treated for upper airway cough syndrome with Zantac, gastroesophageal reflux disease lifestyle modification, Tessalon Perles and Delsym, recommended that he try to rest his voice, and treatment for allergic rhinitis CT scan of sinuses was performed. See summary above.      Assessment & Plan:  Irritable larynx syndrome He has persistent cough despite  having well controlled postnasal drip and acid reflux. His asthma has been stable recently. I believe that this is solely due to irritable larynx syndrome or excessive irritation of his larynx from ongoing coughing and talking.  Today I stressed the importance of voice rest. I told him that he needs to give himself 3 days of complete voice rest, cough suppression, and no throat clearing with tramadol use around-the-clock to help suppress his cough.     Current outpatient prescriptions:  .  albuterol (PROAIR HFA) 108 (90 Base) MCG/ACT inhaler, Inhale 1 puff into the lungs every 6 (six) hours as needed for wheezing or shortness of breath., Disp: 1 Inhaler, Rfl: 6 .  aspirin 81 MG tablet, Take 81 mg by mouth daily., Disp: , Rfl:  .  atorvastatin (LIPITOR) 20 MG tablet, Take 1 tablet (20 mg total) by mouth daily., Disp: 90  tablet, Rfl: 3 .  benzonatate (TESSALON) 200 MG capsule, Take 1 capsule (200 mg total) by mouth 3 (three) times daily as needed for cough., Disp: 30 capsule, Rfl: 1 .  cetirizine (ZYRTEC) 10 MG tablet, Take 10 mg by mouth daily. , Disp: , Rfl:  .  fluticasone (FLOVENT HFA) 110 MCG/ACT inhaler, Inhale 1 puff into the lungs 2 (two) times daily., Disp: 1 Inhaler, Rfl: 6 .  metoprolol (LOPRESSOR) 50 MG tablet, Take 1 tablet (50 mg total) by mouth 2 (two) times daily. Please call and schedule a one year follow up appointment, Disp: 180 tablet, Rfl: 0 .  montelukast (SINGULAIR) 10 MG tablet, Take 1 tablet (10 mg total) by mouth at bedtime., Disp: 30 tablet, Rfl: 11 .  Multiple Vitamins-Minerals (PRESERVISION AREDS PO), Take 2 tablets by mouth every morning., Disp: , Rfl:  .  omeprazole (PRILOSEC) 20 MG capsule, Take 20 mg by mouth daily., Disp: , Rfl:  .  oxymetazoline (AFRIN) 0.05 % nasal spray, Place 1 spray into both nostrils 2 (two) times daily as needed for congestion. Reported on 12/14/2015, Disp: , Rfl:  .  Probiotic Product (ALIGN) 4 MG CAPS, Take 1 capsule by mouth daily., Disp: , Rfl:  .  triamcinolone (NASACORT ALLERGY 24HR) 55 MCG/ACT AERO nasal inhaler, Place 2 sprays into the nose daily., Disp: , Rfl:  .  triamcinolone ointment (KENALOG) 0.1 %, Apply 1 application topically 2 (two) times daily., Disp: , Rfl:  .  valACYclovir (VALTREX) 500 MG tablet, Take 500 mg by mouth as needed., Disp: , Rfl:  .  traMADol (ULTRAM) 50 MG tablet, Take 1 tablet (50 mg total) by mouth every 6 (six) hours as needed (cough)., Disp: 40 tablet, Rfl: 0

## 2016-03-15 NOTE — Assessment & Plan Note (Signed)
Stable interval.  Continue Flovent as prescribed

## 2016-04-19 ENCOUNTER — Telehealth: Payer: Self-pay | Admitting: Family Medicine

## 2016-04-19 NOTE — Telephone Encounter (Signed)
Pt states he has  the beginning of a cold. No appointments  Today and pt declined to see another provider. Pt would like a Zpak called in to  [facility].  Pt states he has had this before, and Dr Sarajane Jews is aware of his history with allergies turning in to something else. Pt going out of town this weekend.

## 2016-04-22 MED ORDER — AZITHROMYCIN 250 MG PO TABS: ORAL_TABLET | ORAL | 0 refills | Status: DC

## 2016-04-22 NOTE — Telephone Encounter (Signed)
I spoke with pt and send script e-scribe to [facility].

## 2016-04-22 NOTE — Telephone Encounter (Signed)
Call in a Zpack  ?

## 2016-04-25 ENCOUNTER — Other Ambulatory Visit: Payer: Self-pay | Admitting: Urology

## 2016-04-25 DIAGNOSIS — C61 Malignant neoplasm of prostate: Secondary | ICD-10-CM

## 2016-04-30 ENCOUNTER — Other Ambulatory Visit: Payer: Self-pay | Admitting: Cardiovascular Disease

## 2016-05-01 ENCOUNTER — Encounter: Payer: Self-pay | Admitting: Pulmonary Disease

## 2016-05-01 ENCOUNTER — Ambulatory Visit (INDEPENDENT_AMBULATORY_CARE_PROVIDER_SITE_OTHER): Payer: Medicare Other | Admitting: Pulmonary Disease

## 2016-05-01 VITALS — BP 110/62 | HR 67 | Ht 68.0 in | Wt 282.4 lb

## 2016-05-01 DIAGNOSIS — K219 Gastro-esophageal reflux disease without esophagitis: Secondary | ICD-10-CM

## 2016-05-01 DIAGNOSIS — J387 Other diseases of larynx: Secondary | ICD-10-CM | POA: Diagnosis not present

## 2016-05-01 DIAGNOSIS — J302 Other seasonal allergic rhinitis: Secondary | ICD-10-CM

## 2016-05-01 MED ORDER — TRAMADOL HCL 50 MG PO TABS: 50.0000 mg | ORAL_TABLET | Freq: Four times a day (QID) | ORAL | 0 refills | Status: DC | PRN

## 2016-05-01 NOTE — Progress Notes (Signed)
[facility]   Chief Complaint  Patient presents with  . Acute Visit    cough x 15 days, "violent" cough, phlegm builds up in chest, no color to phlegm. chest tightness. Took tessalon perles, did not help.  Was given Zpak but has not pick up from pharmacy. Taking Amoxicillin that his dentist gave him.     Current Outpatient Prescriptions on File Prior to Visit  Medication Sig  . albuterol (PROAIR HFA) 108 (90 Base) MCG/ACT inhaler Inhale 1 puff into the lungs every 6 (six) hours as needed for wheezing or shortness of breath.  [name] aspirin 81 MG tablet Take 81 mg by mouth daily.  [name] atorvastatin (LIPITOR) 20 MG tablet Take 1 tablet (20 mg total) by mouth daily.  . cetirizine (ZYRTEC) 10 MG tablet Take 10 mg by mouth daily.   . fluticasone (FLOVENT HFA) 110 MCG/ACT inhaler Inhale 1 puff into the lungs 2 (two) times daily.  . metoprolol (LOPRESSOR) 50 MG tablet TAKE 1 TABLET TWICE DAILY.  . montelukast (SINGULAIR) 10 MG tablet Take 1 tablet (10 mg total) by mouth at bedtime.  . Multiple Vitamins-Minerals (PRESERVISION AREDS PO) Take 2 tablets by mouth every morning.  [name] omeprazole (PRILOSEC) 20 MG capsule Take 20 mg by mouth daily.  [name] oxymetazoline (AFRIN) 0.05 % nasal spray Place 1 spray into both nostrils 2 (two) times daily as needed for congestion. Reported on 12/14/2015  . Probiotic Product (ALIGN) 4 MG CAPS Take 1 capsule by mouth daily.  [name] triamcinolone (NASACORT ALLERGY 24HR) 55 MCG/ACT AERO nasal inhaler Place 2 sprays into the nose daily.  [name] triamcinolone ointment (KENALOG) 0.1 % Apply 1 application topically 2 (two) times daily.  . valACYclovir (VALTREX) 500 MG tablet Take 500 mg by mouth as needed.  [name] azithromycin (ZITHROMAX Z-PAK) 250 MG tablet Take as directed (Patient not taking: Reported on 05/01/2016)   No current facility-administered medications on file prior to visit.      Synopsis:  Former patient of Dr. Gwenette Greet who has irritable larynx syndrome as well as mild  persistent asthma. May 2017 CT scan of the sinuses was a poor study, there is no clear acute or chronic sinusitis noted.  Studies: 12/2015  CT Sinuses >> poor images, no clear acute or chronic sinusitis noted  Past Medical Hx:  has a past medical history of Allergic; Allergy; Asthma; Benign prostatic hypertrophy; CAD (coronary artery disease); Carotid bruit; ED (erectile dysfunction); GERD (gastroesophageal reflux disease); Gout; HTN (hypertension); colonic polyps; Hyperlipidemia; Myocardial infarction ([place]); Obesity; Osteoarthritis; Precancerous skin lesion; Prostate cancer ([place]); and S/P CABG (coronary artery bypass graft) (2007).   Past Surgical hx, Allergies, Family hx, Social hx all reviewed.  Vital Signs BP 110/62 (BP Location: Left Arm, Cuff Size: Normal)   Pulse 67   Ht 5\' 8"  (1.727 m)   Wt 282 lb 6.4 oz (128.1 kg)   SpO2 94%   BMI 42.94 kg/m   History of Present Illness Justin [name] is a 82 y.o. male with a history of gout, hypertension, hyperlipidemia, MI, CAD S/P CABG, prostate cancer, intermittent asthma, irritable larynx syndrome, postnasal drip and allergic rhinitis (followed by [name]) who presented to the pulmonary office for a 15 day history of cough.  Patient was last seen on 03/15/16 for continued complaints of dry cough. At that time it was felt asthma was well controlled, postnasal drip and acid reflux controlled as well. Was recommended that he have 3 days of voice rest, cough suppression, no throat clearing and  round-the-clock tramadol use.  At baseline, he takes albuterol, cetirizine, flovent, singulair, omerazole.  He called his primary MD on 8/25 with above complaints and was given a Z-pak but he did not take the antibiotic.  He was already on amoxicillin for dental coverage.    Pt reports cough began 15 days ago, central chest, "violent" cough, primarily after meals, no color to sputum, minimum sputum production, clear nasal drainage.  Cough kept him up  last night > took codeine cough syrup last night without relief.  Recent gum infection in tooth > Rx for amoxicillin for approx 2 weeks.  Dry cough in exam room.  Voice rest did help. Denies fevers, chills, no chest pain, pain with inspiration  Denies swelling, no weight gain, able to lie flat at night.  Using afrin nasal spray every night > 20 years or more, denies shortness of breath.    Physical Exam  General - well developed adult M in no acute distress ENT - No sinus tenderness, no oral exudate, no LAN Cardiac - s1s2 regular, no murmur Chest - even/non-labored, lungs bilaterally clear, no wheezing Back - No focal tenderness Abd - Soft, non-tender Ext - No edema Neuro - Normal strength Skin - No rashes Psych - normal mood, and behavior   Assessment/Plan  Irritable Larynx Syndrome  Cough GERD   Plan: Continue Nasacort, Singulair, Flovent and zyrtec Continue PPI for reflux Diet modification discussed - small meals etc  Voice rest Tramadol  Nasal hygiene  Would like to see him stop using Afrin but he feels he can not go without Complete abx for dental issues, hold zpak Has follow up with [name] in 3 weeks Consider CT imaging of sinuses    Patient Instructions  1.  Continue your reflux medications > Omeprazole twice daily.  Eat small frequent meals.  Avoid large meals, spicy food, tomato based foods, chocolate and alcoholic beverages as these can make reflux worse 2.  Rest your voice as much as possible during the day 3.  Use tramadol around the clock for the first 48 hours for cough suppression then every 6 hours as needed  4.  Continue nasal hygiene.  5.  We recommend that you do not use Afrin on a daily basis 6.  Hold off on filling the Zpak as you have been on antibiotics for two weeks  7.  You need to try to suppress your cough to allow your larynx (voice box) to heal.  For three days don't talk, laugh, sing, or clear your throat. Do everything you can to  suppress the cough during this time. Use hard candies (sugarless Jolly Ranchers) or non-mint or non-menthol containing cough drops during this time to soothe your throat.  Use a cough suppressant (Delsym or Tramadol) around the clock during this time.  After three days, gradually increase the use of your voice and back off on the cough suppressants. 8.  Follow up with [name] as scheduled. Please call sooner if new needs arise.      Noe Gens, NP-C [facility]  [phone] 05/01/2016, 11:59 AM

## 2016-05-01 NOTE — Patient Instructions (Addendum)
1.  Continue your reflux medications > Omeprazole twice daily.  Eat small frequent meals.  Avoid large meals, spicy food, tomato based foods, chocolate and alcoholic beverages as these can make reflux worse 2.  Rest your voice as much as possible during the day 3.  Use tramadol around the clock for the first 48 hours for cough suppression then every 6 hours as needed  4.  Continue nasal hygiene.  5.  We recommend that you do not use Afrin on a daily basis 6.  Hold off on filling the Zpak as you have been on antibiotics for two weeks  7.  You need to try to suppress your cough to allow your larynx (voice box) to heal.  For three days don't talk, laugh, sing, or clear your throat. Do everything you can to suppress the cough during this time. Use hard candies (sugarless Jolly Ranchers) or non-mint or non-menthol containing cough drops during this time to soothe your throat.  Use a cough suppressant (Delsym or Tramadol) around the clock during this time.  After three days, gradually increase the use of your voice and back off on the cough suppressants. 8.  Follow up with [name] as scheduled. Please call sooner if new needs arise.

## 2016-05-02 NOTE — Progress Notes (Signed)
Reviewed, agree with this plan of care 

## 2016-05-16 NOTE — Progress Notes (Signed)
Chief Complaint  Patient presents with  . Coronary Artery Disease    follow up    History of Present Illness: 82 yo WM with a h/o HTN, HLD and CAD s/p 4V CABG March 2007 who is here today for cardiac follow up. He has been followed in the past by Dr. Rayford Halsted and then by Dr. Haroldine Laws. He has not had a cardiac cath since his bypass in 2007. Carotid u/s in 10/10: 0-39% bilaterally. Had nuclear stress test 4/11 EF 54%. Inferior, inferoseptal and apical defect consistent with scar and possible soft tissue attenuation. No signficiant ischemia. Echo June 2016 with normal EF, moderate Aortic valve stenosis with mean gradient 27 mmHg. AVA around 0.75cm2 cm2.   He is here today for follow up. He has been doing well. No CP or dyspnea. Compliant with medications. He exercises 3-4 days per week. He is still working as a Chief Strategy Officer. Occasional mild LE edema.   Primary Care Physician: Laurey Morale, MD   Past Medical History:  Diagnosis Date  . Allergic    (sees Dr. Velora Heckler)  . Allergy   . Asthma    sees [name]   . Benign prostatic hypertrophy    (sees Dr. Denman George  . CAD (coronary artery disease)    sees Dr. Haroldine Laws   . Carotid bruit    carotid u/s 10/10: 0.39% bilaterally  . ED (erectile dysfunction)   . GERD (gastroesophageal reflux disease)   . Gout   . HTN (hypertension)   . Hx of colonic polyps    (sees Dr. Henrene Pastor)  . Hyperlipidemia   . Myocardial infarction ([place])   . Obesity   . Osteoarthritis    especially in left knee, (sees Dr. Alphonzo Severance )  . Precancerous skin lesion    (sees Dr. Allyson Sabal)  . Prostate cancer ([place])   . S/P CABG (coronary artery bypass graft) 2007    Coronary artery bypass grafting x4 with left internal(Edward Servando Snare, MD)    Past Surgical History:  Procedure Laterality Date  . Bilateral subcutaneous mastectomies    . CARDIAC CATHETERIZATION  10/29/2005  . CATARACT EXTRACTION, BILATERAL    . COLONOSCOPY  06/30/2008   no repeats needed   . CORONARY  ARTERY BYPASS GRAFT  2007  . Hammer toe repairs bilaterally    . KNEE ARTHROPLASTY  07/30/2011   Procedure: COMPUTER ASSISTED TOTAL KNEE ARTHROPLASTY;  Surgeon: Meredith Pel;  Location: [facility];  Service: Orthopedics;  Laterality: Left;  left total knee arthroplasty  . Repair of right thumb injury    . REPLACEMENT TOTAL KNEE BILATERAL  2012    Current Outpatient Prescriptions  Medication Sig Dispense Refill  . albuterol (PROAIR HFA) 108 (90 Base) MCG/ACT inhaler Inhale 1 puff into the lungs every 6 (six) hours as needed for wheezing or shortness of breath. 1 Inhaler 6  . aspirin 81 MG tablet Take 81 mg by mouth daily.    [name] atorvastatin (LIPITOR) 20 MG tablet Take 1 tablet (20 mg total) by mouth daily. 90 tablet 3  . cetirizine (ZYRTEC) 10 MG tablet Take 10 mg by mouth daily.     . fluticasone (FLOVENT HFA) 110 MCG/ACT inhaler Inhale 1 puff into the lungs 2 (two) times daily. 1 Inhaler 6  . metoprolol (LOPRESSOR) 50 MG tablet Take 50 mg by mouth 2 (two) times daily.    . montelukast (SINGULAIR) 10 MG tablet Take 1 tablet (10 mg total) by mouth at bedtime. 30 tablet 11  . Multiple Vitamins-Minerals (  PRESERVISION AREDS PO) Take 2 tablets by mouth every morning.    [name] omeprazole (PRILOSEC) 20 MG capsule Take 20 mg by mouth daily.    [name] oxymetazoline (AFRIN) 0.05 % nasal spray Place 1 spray into both nostrils 2 (two) times daily as needed for congestion. Reported on 12/14/2015    . Probiotic Product (ALIGN) 4 MG CAPS Take 1 capsule by mouth daily.    . ranitidine (ZANTAC) 150 MG capsule Take 150 mg by mouth 2 (two) times daily.    [name] triamcinolone (NASACORT ALLERGY 24HR) 55 MCG/ACT AERO nasal inhaler Place 2 sprays into the nose daily.    [name] triamcinolone ointment (KENALOG) 0.1 % Apply 1 application topically 2 (two) times daily.    . valACYclovir (VALTREX) 500 MG tablet Take 500 mg by mouth as needed (break outs).      No current facility-administered medications for this visit.     Allergies    Allergen Reactions  . Amlodipine     Swelling in ankles  . Peanut-Containing Drug Products Anaphylaxis  . Lisinopril     cough  . Prednisone     Constipation & "he could not sleep" **Can tolerate in certain situations**  . Sulfonamide Derivatives     REACTION: anaphylaxis    Social History   Social History  . Marital status: Married    Spouse name: N/A  . Number of children: N/A  . Years of education: N/A   Occupational History  . general contractor     builds malls   Social History Main Topics  . Smoking status: Former Smoker    Packs/day: 3.00    Years: 6.00    Types: Cigarettes    Quit date: 08/26/1958  . Smokeless tobacco: Never Used  . Alcohol use 0.0 oz/week     Comment: 2-3 per week  . Drug use: No  . Sexual activity: Not on file   Other Topics Concern  . Not on file   Social History Narrative   FH of CAD, Male 1st degree relative less than age 69.    Family History  Problem Relation Age of Onset  . Heart attack Father 83  . Heart failure Mother 19  . Uterine cancer Mother     Review of Systems:  As stated in the HPI and otherwise negative.   BP 130/60   Pulse 64   Ht 5\' 8"  (1.727 m)   Wt 130 kg (286 lb 9.6 oz)   BMI 43.58 kg/m   Physical Examination: General: Well developed, well nourished, NAD  HEENT: OP clear, mucus membranes moist  SKIN: warm, dry. No rashes. Neuro: No focal deficits  Musculoskeletal: Muscle strength 5/5 all ext  Psychiatric: Mood and affect normal  Neck: No JVD, no carotid bruits, no thyromegaly, no lymphadenopathy.  Lungs:Clear bilaterally, no wheezes, rhonci, crackles Cardiovascular: Regular rate and rhythm. Loud systolic murmur. No gallops or rubs. Abdomen:Soft. Bowel sounds present. Non-tender.  Extremities: No lower extremity edema. Pulses are 2 + in the bilateral DP/PT.  Echo June 2016: Left ventricle: The cavity size was normal. There was mild   concentric hypertrophy. Systolic function was normal. The    estimated ejection fraction was in the range of 55% to 60%. Wall   motion was normal; there were no regional wall motion   abnormalities. Features are consistent with a pseudonormal left   ventricular filling pattern, with concomitant abnormal relaxation   and increased filling pressure (grade 2 diastolic dysfunction). - Aortic valve: Cusp separation was  reduced. There was moderate   stenosis. There was trivial regurgitation. Peak velocity (S): 366   cm/s. Mean gradient (S): 27 mm Hg. - Mitral valve: Moderately calcified annulus. - Left atrium: The atrium was moderately dilated. - Right atrium: The atrium was mildly dilated. Impressions: - When compared to prior echocardiogram, mean gradient has   increased from 24mmHg.  EKG:  EKG is  ordered today. The ekg ordered today demonstrates NSR, rate 64 bpm. Inferior Q-waves. Poor R wave progression.   Recent Labs: 09/18/2015: ALT 15; BUN 14; Creatinine, Ser 0.77; Hemoglobin 14.9; Platelets 243.0; Potassium 4.5; Sodium 140; TSH 3.73   Lipid Panel    Component Value Date/Time   CHOL 127 09/18/2015 1007   TRIG 136.0 09/18/2015 1007   TRIG 143 06/30/2006 1132   HDL 39.50 09/18/2015 1007   CHOLHDL 3 09/18/2015 1007   VLDL 27.2 09/18/2015 1007   LDLCALC 60 09/18/2015 1007     Wt Readings from Last 3 Encounters:  05/17/16 130 kg (286 lb 9.6 oz)  05/01/16 128.1 kg (282 lb 6.4 oz)  03/15/16 128.4 kg (283 lb)     Other studies Reviewed: Additional studies/ records that were reviewed today include:  Review of the above records demonstrates:    Assessment and Plan:   1. CAD: s/p CABG. He has had no chest pain concerning for angina. Will continue current medical therapy.   2. Aortic valve stenosis: Moderate to severe stenosis by echo June 2016. Repeat echo now. We discussed AVR and TAVR when needed. He is asymptomatic. I suspect he will need TAVR as he has had prior open heart surgery   3. HTN: BP controlled. No changes.   4. HLD:  Continue statin. Lipids are well controlled.   Current medicines are reviewed at length with the patient today.  The patient does not have concerns regarding medicines.  The following changes have been made:  no change  Labs/ tests ordered today include:   Orders Placed This Encounter  Procedures  . EKG 12-Lead  . ECHOCARDIOGRAM COMPLETE     Disposition:   FU with me in 3  months   Signed, [name], MD 05/17/2016 9:24 AM    [facility] [place], [place],   [address] Phone: [phone]; Fax: [phone]

## 2016-05-17 ENCOUNTER — Encounter: Payer: Self-pay | Admitting: Cardiovascular Disease

## 2016-05-17 ENCOUNTER — Ambulatory Visit (INDEPENDENT_AMBULATORY_CARE_PROVIDER_SITE_OTHER): Payer: Medicare Other | Admitting: Cardiovascular Disease

## 2016-05-17 VITALS — BP 130/60 | HR 64 | Ht 68.0 in | Wt 286.6 lb

## 2016-05-17 DIAGNOSIS — E785 Hyperlipidemia, unspecified: Secondary | ICD-10-CM

## 2016-05-17 DIAGNOSIS — I10 Essential (primary) hypertension: Secondary | ICD-10-CM | POA: Diagnosis not present

## 2016-05-17 DIAGNOSIS — I350 Nonrheumatic aortic (valve) stenosis: Secondary | ICD-10-CM | POA: Diagnosis not present

## 2016-05-17 DIAGNOSIS — I2510 Atherosclerotic heart disease of native coronary artery without angina pectoris: Secondary | ICD-10-CM

## 2016-05-17 NOTE — Patient Instructions (Signed)
Medication Instructions:  Your physician recommends that you continue on your current medications as directed. Please refer to the Current Medication list given to you today.   Labwork: none  Testing/Procedures: Your physician has requested that you have an echocardiogram. Echocardiography is a painless test that uses sound waves to create images of your heart. It provides your doctor with information about the size and shape of your heart and how well your heart's chambers and valves are working. This procedure takes approximately one hour. There are no restrictions for this procedure.    Follow-Up: Your physician recommends that you schedule a follow-up appointment in: 3 months    Any Other Special Instructions Will Be Listed Below (If Applicable).     If you need a refill on your cardiac medications before your next appointment, please call your pharmacy.   

## 2016-05-20 ENCOUNTER — Ambulatory Visit (INDEPENDENT_AMBULATORY_CARE_PROVIDER_SITE_OTHER): Payer: Medicare Other | Admitting: Pulmonary Disease

## 2016-05-20 ENCOUNTER — Encounter: Payer: Self-pay | Admitting: Pulmonary Disease

## 2016-05-20 DIAGNOSIS — J301 Allergic rhinitis due to pollen: Secondary | ICD-10-CM | POA: Diagnosis not present

## 2016-05-20 DIAGNOSIS — Z23 Encounter for immunization: Secondary | ICD-10-CM | POA: Diagnosis not present

## 2016-05-20 MED ORDER — IPRATROPIUM BROMIDE 0.03 % NA SOLN: 2.0000 | Freq: Four times a day (QID) | NASAL | 1 refills | Status: DC

## 2016-05-20 NOTE — Patient Instructions (Signed)
Take the ipratropium nasal spray as directed Continue taking your allergic rhinitis medicines as you are doing  We will see you back in 4 weeks or sooner if needed

## 2016-05-20 NOTE — Assessment & Plan Note (Signed)
Justin [name] has really struggled with allergic rhinitis this year despite taking multiple medicines for this condition. I believe that this is the primary cause of his persistent cough as his CT sinuses did not show evidence of a chronic sinus infection, his asthma has not been flaring up, and his chest x-ray has not shown evidence of active pulmonary disease of some other cause. His acid reflux also seems to be well controlled.  I wonder whether or not there is a component of vasomotor rhinitis contributing.  I also worry that he may be dependent on Afrin.  Plan: Start ipratropium nasal spray Continue nasal mometasone, montelukast, cetirizine, and Flovent  If no improvement in his cough by the next visit then consider going back for allergy testing again versus enrollment in our clinical trial for chronic cough Follow-up 4 weeks  > 50% of today's 26 minute visit spent face to face

## 2016-05-20 NOTE — Progress Notes (Signed)
Subjective:    Patient ID: Justin [name], male    DOB: 1934/03/08, 82 y.o.   MRN: [id number]  Synopsis: Former patient of Dr. Gwenette Greet who has irritable larynx syndrome as well as mild persistent asthma.' May 2017 CT scan of the sinuses was a poor study, there is no clear acute or chronic sinusitis noted.  HPI  Chief Complaint  Patient presents with  . Follow-up    Pt reports he still has "heavy breathing" but Cough has improved.    Mr. Wiebelhaus cough was worse lately.  He has been having a lot of significant clear sinus congeiton lately which contributes to his cough. He says that his sinus congestion has been much worse this year than prior. He has had clear mucus production which he again attributes to his sinus congestion. The sinus congestion is worse in the mornings.. No recent changes in environment, no change in work exposures. He is taking his allergy pills nightly.  He has been taking his afrin nightly.  He   Past Medical History:  Diagnosis Date  . Allergic    (sees Dr. Velora Heckler)  . Allergy   . Asthma    sees [name]   . Benign prostatic hypertrophy    (sees Dr. Denman George  . CAD (coronary artery disease)    sees Dr. Haroldine Laws   . Carotid bruit    carotid u/s 10/10: 0.39% bilaterally  . ED (erectile dysfunction)   . GERD (gastroesophageal reflux disease)   . Gout   . HTN (hypertension)   . Hx of colonic polyps    (sees Dr. Henrene Pastor)  . Hyperlipidemia   . Myocardial infarction ([place])   . Obesity   . Osteoarthritis    especially in left knee, (sees Dr. Alphonzo Severance )  . Precancerous skin lesion    (sees Dr. Allyson Sabal)  . Prostate cancer ([place])   . S/P CABG (coronary artery bypass graft) 2007    Coronary artery bypass grafting x4 with left internal(Edward Servando Snare, MD)     Review of Systems  Constitutional: Negative for chills, fatigue and fever.  HENT: Negative for postnasal drip, rhinorrhea and sinus pressure.   Respiratory: Positive for cough. Negative  for shortness of breath and wheezing.   Cardiovascular: Negative for chest pain, palpitations and leg swelling.       Objective:   Physical Exam  Vitals:   05/20/16 0946  BP: 124/60  Pulse: 60  SpO2: 96%  Weight: 278 lb 3.2 oz (126.2 kg)  Height: 5\' 8"  (1.727 m)    RA  Gen: obese but well appearing HENT: OP clear, TM's clear, neck supple PULM: CTA B, normal percussion CV: RRR, no mgr, trace edema GI: BS+, soft, nontender Derm: no cyanosis or rash Psyche: normal mood and affect   Records from his last visit with [place] reviewed where he was treated for allergic rhinitis  CT sinuses this year without evidence of acute infection, chest x-ray this year normal     Assessment & Plan:  Allergic rhinitis Mr. Stavropoulos has really struggled with allergic rhinitis this year despite taking multiple medicines for this condition. I believe that this is the primary cause of his persistent cough as his CT sinuses did not show evidence of a chronic sinus infection, his asthma has not been flaring up, and his chest x-ray has not shown evidence of active pulmonary disease of some other cause. His acid reflux also seems to be well controlled.  I wonder whether or not there  is a component of vasomotor rhinitis contributing.  I also worry that he may be dependent on Afrin.  Plan: Start ipratropium nasal spray Continue nasal mometasone, montelukast, cetirizine, and Flovent  If no improvement in his cough by the next visit then consider going back for allergy testing again versus enrollment in our clinical trial for chronic cough Follow-up 4 weeks  > 50% of today's 26 minute visit spent face to face    Current Outpatient Prescriptions:  .  albuterol (PROAIR HFA) 108 (90 Base) MCG/ACT inhaler, Inhale 1 puff into the lungs every 6 (six) hours as needed for wheezing or shortness of breath., Disp: 1 Inhaler, Rfl: 6 .  aspirin 81 MG tablet, Take 81 mg by mouth daily., Disp: , Rfl:  .  atorvastatin  (LIPITOR) 20 MG tablet, Take 1 tablet (20 mg total) by mouth daily., Disp: 90 tablet, Rfl: 3 .  cetirizine (ZYRTEC) 10 MG tablet, Take 10 mg by mouth daily. , Disp: , Rfl:  .  fluticasone (FLOVENT HFA) 110 MCG/ACT inhaler, Inhale 1 puff into the lungs 2 (two) times daily., Disp: 1 Inhaler, Rfl: 6 .  metoprolol (LOPRESSOR) 50 MG tablet, Take 50 mg by mouth 2 (two) times daily., Disp: , Rfl:  .  montelukast (SINGULAIR) 10 MG tablet, Take 1 tablet (10 mg total) by mouth at bedtime., Disp: 30 tablet, Rfl: 11 .  Multiple Vitamins-Minerals (PRESERVISION AREDS PO), Take 2 tablets by mouth every morning., Disp: , Rfl:  .  omeprazole (PRILOSEC) 20 MG capsule, Take 20 mg by mouth daily., Disp: , Rfl:  .  oxymetazoline (AFRIN) 0.05 % nasal spray, Place 1 spray into both nostrils 2 (two) times daily as needed for congestion. Reported on 12/14/2015, Disp: , Rfl:  .  Probiotic Product (ALIGN) 4 MG CAPS, Take 1 capsule by mouth daily., Disp: , Rfl:  .  ranitidine (ZANTAC) 150 MG capsule, Take 150 mg by mouth 2 (two) times daily., Disp: , Rfl:  .  triamcinolone (NASACORT ALLERGY 24HR) 55 MCG/ACT AERO nasal inhaler, Place 2 sprays into the nose daily., Disp: , Rfl:  .  triamcinolone ointment (KENALOG) 0.1 %, Apply 1 application topically 2 (two) times daily., Disp: , Rfl:  .  valACYclovir (VALTREX) 500 MG tablet, Take 500 mg by mouth as needed (break outs). , Disp: , Rfl:  .  ipratropium (ATROVENT) 0.03 % nasal spray, Place 2 sprays into the nose 4 (four) times daily., Disp: 30 mL, Rfl: 1

## 2016-05-27 DIAGNOSIS — L820 Inflamed seborrheic keratosis: Secondary | ICD-10-CM | POA: Diagnosis not present

## 2016-05-27 DIAGNOSIS — L821 Other seborrheic keratosis: Secondary | ICD-10-CM | POA: Diagnosis not present

## 2016-05-27 DIAGNOSIS — D225 Melanocytic nevi of trunk: Secondary | ICD-10-CM | POA: Diagnosis not present

## 2016-05-27 DIAGNOSIS — R208 Other disturbances of skin sensation: Secondary | ICD-10-CM | POA: Diagnosis not present

## 2016-05-27 DIAGNOSIS — D1801 Hemangioma of skin and subcutaneous tissue: Secondary | ICD-10-CM | POA: Diagnosis not present

## 2016-05-27 DIAGNOSIS — L570 Actinic keratosis: Secondary | ICD-10-CM | POA: Diagnosis not present

## 2016-05-29 DIAGNOSIS — H353112 Nonexudative age-related macular degeneration, right eye, intermediate dry stage: Secondary | ICD-10-CM | POA: Diagnosis not present

## 2016-05-29 DIAGNOSIS — H353122 Nonexudative age-related macular degeneration, left eye, intermediate dry stage: Secondary | ICD-10-CM | POA: Diagnosis not present

## 2016-05-30 ENCOUNTER — Ambulatory Visit (HOSPITAL_COMMUNITY)
Admission: RE | Admit: 2016-05-30 | Discharge: 2016-05-30 | Disposition: A | Discharge status: Home / Self Care | Payer: Medicare Other | Source: Ambulatory Visit | Attending: Urology | Admitting: Urology

## 2016-05-30 ENCOUNTER — Other Ambulatory Visit (HOSPITAL_COMMUNITY): Payer: Medicare Other

## 2016-05-30 DIAGNOSIS — C61 Malignant neoplasm of prostate: Secondary | ICD-10-CM | POA: Insufficient documentation

## 2016-05-30 LAB — POCT I-STAT CREATININE: CREATININE: 0.7 mg/dL (ref 0.61–1.24)

## 2016-05-30 MED ORDER — GADOBENATE DIMEGLUMINE 529 MG/ML IV SOLN
20.0000 mL | Freq: Once | INTRAVENOUS | Status: AC | PRN
  Administered 2016-05-30: 20 mL via INTRAVENOUS

## 2016-05-31 ENCOUNTER — Ambulatory Visit (HOSPITAL_COMMUNITY): Payer: Medicare Other | Attending: Internal Medicine

## 2016-05-31 ENCOUNTER — Other Ambulatory Visit: Payer: Self-pay

## 2016-05-31 DIAGNOSIS — I371 Nonrheumatic pulmonary valve insufficiency: Secondary | ICD-10-CM | POA: Insufficient documentation

## 2016-05-31 DIAGNOSIS — Z951 Presence of aortocoronary bypass graft: Secondary | ICD-10-CM | POA: Diagnosis not present

## 2016-05-31 DIAGNOSIS — I350 Nonrheumatic aortic (valve) stenosis: Secondary | ICD-10-CM

## 2016-05-31 LAB — ECHOCARDIOGRAM COMPLETE

## 2016-05-31 MED ORDER — PERFLUTREN LIPID MICROSPHERE
1.0000 mL | INTRAVENOUS | Status: AC | PRN
  Administered 2016-05-31: 2 mL via INTRAVENOUS

## 2016-06-04 ENCOUNTER — Telehealth: Payer: Self-pay | Admitting: Cardiovascular Disease

## 2016-06-04 NOTE — Telephone Encounter (Signed)
New Message ° °Pt returning RN call. Please call back to discuss  °

## 2016-06-04 NOTE — Telephone Encounter (Signed)
I spoke to patient. I advised him of results and to keep 3 mo f/u 10/30 w/ Dr Angelena Form. He voiced understanding and agreed with the plan. See result note.

## 2016-06-06 DIAGNOSIS — C61 Malignant neoplasm of prostate: Secondary | ICD-10-CM | POA: Diagnosis not present

## 2016-06-06 DIAGNOSIS — N400 Enlarged prostate without lower urinary tract symptoms: Secondary | ICD-10-CM | POA: Diagnosis not present

## 2016-06-24 ENCOUNTER — Ambulatory Visit: Payer: Medicare Other | Admitting: Pulmonary Disease

## 2016-07-05 ENCOUNTER — Other Ambulatory Visit: Payer: Self-pay | Admitting: Cardiovascular Disease

## 2016-07-11 ENCOUNTER — Ambulatory Visit (INDEPENDENT_AMBULATORY_CARE_PROVIDER_SITE_OTHER): Payer: Medicare Other | Admitting: Pulmonary Disease

## 2016-07-11 ENCOUNTER — Encounter: Payer: Self-pay | Admitting: Pulmonary Disease

## 2016-07-11 DIAGNOSIS — J4520 Mild intermittent asthma, uncomplicated: Secondary | ICD-10-CM | POA: Diagnosis not present

## 2016-07-11 DIAGNOSIS — J301 Allergic rhinitis due to pollen: Secondary | ICD-10-CM

## 2016-07-11 NOTE — Assessment & Plan Note (Signed)
This is seasonal in nature but occurrs for about 6-9 months of the year.  He says that it is well controlled right now.   Plan: I explained that his regimen for should be: nasacort, Zyrtec, montelukast If he has symptoms despite this regimen then we can start Xolair Hold off on allergy referral No benefit from ipratropium so we'll stop that.

## 2016-07-11 NOTE — Patient Instructions (Addendum)
For your allergic rhinitis I recommend the following: take the Zyrtec in the morning, Montelukast in the evening, and the Nasacort in the mornings If your symptoms worsen despite these medicines then we can give you a medicine called Xolair I will see you back in the spring or sooner if needed

## 2016-07-11 NOTE — Assessment & Plan Note (Signed)
Continue Flovent. Stable interval. Flu shot up-to-date.

## 2016-07-11 NOTE — Progress Notes (Signed)
Subjective:    Patient ID: Justin [name], male    DOB: 02/27/1934, 82 y.o.   MRN: [id number]  Synopsis: Former patient of Dr. Gwenette [name] who has irritable larynx syndrome as well as mild persistent asthma.' May 2017 CT scan of the sinuses was a poor study, there is no clear acute or chronic sinusitis noted. Previously treated with immunotherapy by Dr. Velora [name] many years prior.   HPI  Chief Complaint  Patient presents with  . Follow-up    Pt states he still has sinus congestion - baseline. Pt states his cough has resovled since last OV. Pt denies SOB and CP/tightness and f/c/s.    [name] reports that his cough is better since the last visit.  He says that it is impossible for him to tell if his inhalers or pills are doing any good.  He says the allergy pill (Zyrtec) in the morning really makes a difference.  He is taking benadryl at night.  He continues to use Afrin at night.  He wakes up frequently with dry mouth throughout the night.  He is using his Flovent inhaler in the morning.    Ipratropium nasal spray did not help.    He notes some dyspnea on exertion with chest "discomfort" when he is climbing stairs or an incline.  He is following with Dr. Angelena [name]  Past Medical History:  Diagnosis Date  . Allergic    (sees Dr. Velora [name])  . Allergy   . Asthma    sees Dr. Lake [name]   . Benign prostatic hypertrophy    (sees Justin [name]  . CAD (coronary artery disease)    sees Dr. Haroldine [name]   . Carotid bruit    carotid u/s 10/10: 0.39% bilaterally  . ED (erectile dysfunction)   . GERD (gastroesophageal reflux disease)   . Gout   . HTN (hypertension)   . Hx of colonic polyps    (sees Dr. Henrene [name])  . Hyperlipidemia   . Myocardial infarction   . Obesity   . Osteoarthritis    especially in left knee, (sees Dr. Alphonzo [name] )  . Precancerous skin lesion    (sees Dr. Allyson [name])  . Prostate cancer (Justin [name])   . S/P CABG (coronary artery bypass graft) 2007    Coronary artery bypass  grafting x4 with left internal(Justin Servando Snare, MD)     Review of Systems  Constitutional: Negative for chills, fatigue and fever.  HENT: Negative for postnasal drip, rhinorrhea and sinus pressure.   Respiratory: Positive for cough. Negative for shortness of breath and wheezing.   Cardiovascular: Negative for chest pain, palpitations and leg swelling.       Objective:   Physical Exam  Vitals:   07/11/16 0936  BP: 126/62  Pulse: 60  SpO2: 97%  Weight: 284 lb (128.8 kg)  Height: 5\' 8"  (1.727 m)    RA  Gen: obese but well appearing HENT: OP clear, TM's clear, neck supple PULM: CTA B, normal percussion CV: RRR, no mgr, trace edema GI: BS+, soft, nontender Derm: no cyanosis or rash Psyche: normal mood and affect   October 2017 echocardiogram results showed moderate aortic stenosis     Assessment & Plan:  Allergic rhinitis This is seasonal in nature but occurrs for about 6-9 months of the year.  He says that it is well controlled right now.   Plan: I explained that his regimen for should be: nasacort, Zyrtec, montelukast If he has symptoms despite this regimen then we can start Xolair Hold  off on allergy referral No benefit from ipratropium so we'll stop that.  Asthma, intermittent Continue Flovent. Stable interval. Flu shot up-to-date.    Current Outpatient Prescriptions:  .  albuterol (PROAIR HFA) 108 (90 Base) MCG/ACT inhaler, Inhale 1 puff into the lungs every 6 (six) hours as needed for wheezing or shortness of breath., Disp: 1 Inhaler, Rfl: 6 .  aspirin 81 MG tablet, Take 81 mg by mouth daily., Disp: , Rfl:  .  atorvastatin (LIPITOR) 20 MG tablet, Take 1 tablet (20 mg total) by mouth daily., Disp: 90 tablet, Rfl: 3 .  cetirizine (ZYRTEC) 10 MG tablet, Take 10 mg by mouth daily. , Disp: , Rfl:  .  fluticasone (FLOVENT HFA) 110 MCG/ACT inhaler, Inhale 1 puff into the lungs 2 (two) times daily., Disp: 1 Inhaler, Rfl: 6 .  metoprolol (LOPRESSOR) 50 MG tablet,  Take 1 tablet (50 mg total) by mouth 2 (two) times daily., Disp: 180 tablet, Rfl: 2 .  montelukast (SINGULAIR) 10 MG tablet, Take 1 tablet (10 mg total) by mouth at bedtime., Disp: 30 tablet, Rfl: 11 .  Multiple Vitamins-Minerals (PRESERVISION AREDS PO), Take 2 tablets by mouth every morning., Disp: , Rfl:  .  omeprazole (PRILOSEC) 20 MG capsule, Take 20 mg by mouth daily., Disp: , Rfl:  .  oxymetazoline (AFRIN) 0.05 % nasal spray, Place 1 spray into both nostrils 2 (two) times daily as needed for congestion. Reported on 12/14/2015, Disp: , Rfl:  .  Probiotic Product (ALIGN) 4 MG CAPS, Take 1 capsule by mouth daily., Disp: , Rfl:  .  ranitidine (ZANTAC) 150 MG capsule, Take 150 mg by mouth 2 (two) times daily., Disp: , Rfl:  .  triamcinolone (NASACORT ALLERGY 24HR) 55 MCG/ACT AERO nasal inhaler, Place 2 sprays into the nose daily., Disp: , Rfl:  .  triamcinolone ointment (KENALOG) 0.1 %, Apply 1 application topically 2 (two) times daily., Disp: , Rfl:  .  valACYclovir (VALTREX) 500 MG tablet, Take 500 mg by mouth as needed (break outs). , Disp: , Rfl:

## 2016-07-29 ENCOUNTER — Ambulatory Visit (INDEPENDENT_AMBULATORY_CARE_PROVIDER_SITE_OTHER): Payer: Medicare Other | Admitting: Orthopedic Surgery

## 2016-07-29 ENCOUNTER — Ambulatory Visit: Payer: Medicare Other | Admitting: Family Medicine

## 2016-07-29 ENCOUNTER — Encounter (INDEPENDENT_AMBULATORY_CARE_PROVIDER_SITE_OTHER): Payer: Self-pay | Admitting: Orthopedic Surgery

## 2016-07-29 DIAGNOSIS — M25532 Pain in left wrist: Secondary | ICD-10-CM

## 2016-07-29 MED ORDER — COLCHICINE 0.6 MG PO TABS: 0.6000 mg | ORAL_TABLET | Freq: Three times a day (TID) | ORAL | 0 refills | Status: DC

## 2016-07-29 NOTE — Progress Notes (Signed)
Office Visit Note   Patient: Justin [name]           Date of Birth: 1933/12/19           MRN: [id number] Visit Date: 07/29/2016 Requested by: Justin Morale, MD [place], [place] [address] PCP: Justin Morale, MD  Subjective: Chief Complaint  Patient presents with  . Left Wrist - Pain, Numbness    HPI Justin [name] is an 82 year old right-hand-dominant patient with 1 day history of acute onset atraumatic left wrist pain.  He does describe history of gout 30 years ago.  Reports a little bit of numbness in the fingers and hand.  Denies any neck pain.  Workup this morning thinking he slept on it wrong.  Describes pain in the anatomic snuffbox radiating to the fingers.  He is using a homemade sling.  He took some ibuprofen this morning and that is helping.              Review of Systems All systems reviewed are negative as they relate to the chief complaint within the history of present illness.  Patient denies  fevers or chills.    Assessment & Plan: Visit Diagnoses:  1. Pain in left wrist     Plan: Impression is likely gout in the left wrist.  The wrist itself is warm and has some swelling.  Patient does have a history of gout.  We discussed medical management versus injection.  He wants to try medical management first.  We will try colchicine 3 times a day for 2 days then once a day for 3 days afterwards.  I think that should help break the cycle of gout in the wrist.  If not he should come back in for an injection and further workup  Follow-Up Instructions: Return if symptoms worsen or fail to improve.   Orders:  No orders of the defined types were placed in this encounter.  Meds ordered this encounter  Medications  . colchicine 0.6 MG tablet    Sig: Take 1 tablet (0.6 mg total) by mouth 3 (three) times daily after meals. Take colchicine 1 tablet 3 times a day for 2 days then 1 tablet once a day for 3 more days then daily as needed for pain in wrist    Dispense:  30  tablet    Refill:  0      Procedures: No procedures performed   Clinical Data: No additional findings.  Objective: Vital Signs: There were no vitals taken for this visit.  Physical Exam  Constitutional: He appears well-developed.  HENT:  Head: Normocephalic.  Eyes: EOM are normal.  Neck: Normal range of motion.  Cardiovascular: Normal rate.   Pulmonary/Chest: Effort normal.  Neurological: He is alert.  Skin: Skin is warm.  Psychiatric: He has a normal mood and affect.    Ortho Exam examination of the left wrist demonstrates tenderness warmth and swelling.  He does have pain with range of motion of the left wrist.  Finger range of motion is intact.  There is no proximal lymphadenopathy.  No tissue crepitus or induration or erythema.  Specialty Comments:  No specialty comments available.  Imaging: No results found.   PMFS History: Patient Active Problem List   Diagnosis Date Noted  . Prostate cancer ([place])   . 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 Edema of both legs 09/01/2014  . Asthma, intermittent 09/01/2014  . Coronary atherosclerosis of native coronary artery 01/01/2011  . Aortic valve disorders  01/01/2011  . ACUTE BRONCHITIS 09/06/2010  . VIRAL URI 08/24/2010  . GOUT 09/12/2009  . TINEA PEDIS 08/15/2009  . CELLULITIS, FOOT 08/15/2009  . CAD, AUTOLOGOUS BYPASS GRAFT 05/10/2009  . MURMUR 05/08/2009  . BRUIT 05/08/2009  . MUSCLE CRAMPS 12/12/2008  . KNEE SPRAIN 09/21/2008  . BENIGN PROSTATIC HYPERTROPHY 07/07/2008  . OSTEOARTHRITIS 07/07/2008  . CONTUSION, HIP 05/13/2008  . BENIGN PROSTATIC HYPERTROPHY, WITH OBSTRUCTION 02/01/2008  . LOW BACK PAIN SYNDROME 02/01/2008  . TESTOSTERONE DEFICIENCY 07/03/2007  . HYPERLIPIDEMIA 07/03/2007  . Essential hypertension 07/03/2007  . MYOCARDIAL INFARCTION, HX OF 07/03/2007  . Allergic rhinitis 07/03/2007  . ACTINIC KERATOSIS 07/03/2007  . COLONIC POLYPS, HX OF 07/03/2007  . ACNE ROSACEA, HX OF 07/03/2007    Past Medical History:  Diagnosis Date  . Allergic    (sees Dr. Velora Heckler)  . Allergy   . Asthma    sees [name]   . Benign prostatic hypertrophy    (sees Dr. Denman George  . CAD (coronary artery disease)    sees Dr. Haroldine Laws   . Carotid bruit    carotid u/s 10/10: 0.39% bilaterally  . ED (erectile dysfunction)   . GERD (gastroesophageal reflux disease)   . Gout   . HTN (hypertension)   . Hx of colonic polyps    (sees Dr. Henrene Pastor)  . Hyperlipidemia   . Myocardial infarction   . Obesity   . Osteoarthritis    especially in left knee, (sees Dr. Alphonzo Severance )  . Precancerous skin lesion    (sees Dr. Allyson Sabal)  . Prostate cancer ([place])   . S/P CABG (coronary artery bypass graft) 2007    Coronary artery bypass grafting x4 with left internal(Edward Servando Snare, MD)    Family History  Problem Relation Age of Onset  . Heart attack Father 78  . Heart failure Mother 63  . Uterine cancer Mother     Past Surgical History:  Procedure Laterality Date  . Bilateral subcutaneous mastectomies    . CARDIAC CATHETERIZATION  10/29/2005  . CATARACT EXTRACTION, BILATERAL    . COLONOSCOPY  06/30/2008   no repeats needed   . CORONARY ARTERY BYPASS GRAFT  2007  . Hammer toe repairs bilaterally    . KNEE ARTHROPLASTY  07/30/2011   Procedure: COMPUTER ASSISTED TOTAL KNEE ARTHROPLASTY;  Surgeon: Meredith Pel;  Location: [facility];  Service: Orthopedics;  Laterality: Left;  left total knee arthroplasty  . Repair of right thumb injury    . REPLACEMENT TOTAL KNEE BILATERAL  2012   Social History   Occupational History  . general contractor     builds malls   Social History Main Topics  . Smoking status: Former Smoker    Packs/day: 3.00    Years: 6.00    Types: Cigarettes    Quit date: 08/26/1958  . Smokeless tobacco: Never Used  . Alcohol use 0.0 oz/week     Comment: 2-3 per week  . Drug use: No  . Sexual activity: Not on file

## 2016-08-13 DIAGNOSIS — L570 Actinic keratosis: Secondary | ICD-10-CM | POA: Diagnosis not present

## 2016-08-13 DIAGNOSIS — L820 Inflamed seborrheic keratosis: Secondary | ICD-10-CM | POA: Diagnosis not present

## 2016-08-13 NOTE — Progress Notes (Signed)
Chief Complaint  Patient presents with  . Follow-up    3 months  . Shortness of Breath    occasional with hill climbing  . Chest Pain    occasional with hill climbing    History of Present Illness: 82 yo WM with a h/o HTN, HLD, aortic stenosis and CAD s/p 4V CABG March 2007 who is here today for cardiac follow up. He has been followed in the past by Dr. Rayford Halsted and then by Dr. Haroldine Laws. He has not had a cardiac cath since his bypass in 2007. Carotid u/s in 2010 with 0-39% bilateral ICA stenosis. Nuclear stress test 2011 with LVEF 54%, inferior, inferoseptal and apical defect consistent with scar and possible soft tissue attenuation. No signficiant ischemia. Echo October 2017 normal EF, moderate Aortic valve stenosis with mean gradient 28 mmHg, peak gradient 41 mmHg.   He is here today for follow up. He has been having chest pressure and dyspnea when climbing hills. NO resting pain or dyspnea. No LE edema. He is still working as a Chief Strategy Officer.   Primary Care Physician: [name], MD   Past Medical History:  Diagnosis Date  . Allergic    (sees Dr. Velora Heckler)  . Allergy   . Asthma    sees [name]   . Benign prostatic hypertrophy    (sees Dr. Denman George  . CAD (coronary artery disease)    sees Dr. Haroldine Laws   . Carotid bruit    carotid u/s 10/10: 0.39% bilaterally  . ED (erectile dysfunction)   . GERD (gastroesophageal reflux disease)   . Gout   . HTN (hypertension)   . Hx of colonic polyps    (sees Dr. Henrene Pastor)  . Hyperlipidemia   . Myocardial infarction   . Obesity   . Osteoarthritis    especially in left knee, (sees Dr. Alphonzo Severance )  . Precancerous skin lesion    (sees Dr. Allyson Sabal)  . Prostate cancer (Justin [name])   . S/P CABG (coronary artery bypass graft) 2007    Coronary artery bypass grafting x4 with left internal(Edward Servando Snare, MD)    Past Surgical History:  Procedure Laterality Date  . Bilateral subcutaneous mastectomies    . CARDIAC CATHETERIZATION  10/29/2005  .  CATARACT EXTRACTION, BILATERAL    . COLONOSCOPY  06/30/2008   no repeats needed   . CORONARY ARTERY BYPASS GRAFT  2007  . Hammer toe repairs bilaterally    . KNEE ARTHROPLASTY  07/30/2011   Procedure: COMPUTER ASSISTED TOTAL KNEE ARTHROPLASTY;  Surgeon: Meredith Pel;  Location: [facility];  Service: Orthopedics;  Laterality: Left;  left total knee arthroplasty  . Repair of right thumb injury    . REPLACEMENT TOTAL KNEE BILATERAL  2012    Current Outpatient Prescriptions  Medication Sig Dispense Refill  . albuterol (PROAIR HFA) 108 (90 Base) MCG/ACT inhaler Inhale 1 puff into the lungs every 6 (six) hours as needed for wheezing or shortness of breath. 1 Inhaler 6  . aspirin 81 MG tablet Take 81 mg by mouth daily.    [name] atorvastatin (LIPITOR) 20 MG tablet Take 1 tablet (20 mg total) by mouth daily. 90 tablet 3  . cetirizine (ZYRTEC) 10 MG tablet Take 10 mg by mouth daily.     . fluticasone (FLOVENT HFA) 110 MCG/ACT inhaler Inhale 1 puff into the lungs 2 (two) times daily. 1 Inhaler 6  . metoprolol (LOPRESSOR) 50 MG tablet Take 1 tablet (50 mg total) by mouth 2 (two) times daily. 180 tablet  2  . montelukast (SINGULAIR) 10 MG tablet Take 1 tablet (10 mg total) by mouth at bedtime. 30 tablet 11  . Multiple Vitamins-Minerals (PRESERVISION AREDS PO) Take 2 tablets by mouth every morning.    [name] oxymetazoline (AFRIN) 0.05 % nasal spray Place 1 spray into both nostrils 2 (two) times daily as needed for congestion. Reported on 12/14/2015    . Probiotic Product (ALIGN) 4 MG CAPS Take 1 capsule by mouth daily.    . ranitidine (ZANTAC) 150 MG capsule Take 150 mg by mouth 2 (two) times daily.    [name] triamcinolone (NASACORT ALLERGY 24HR) 55 MCG/ACT AERO nasal inhaler Place 2 sprays into the nose daily.    [name] triamcinolone ointment (KENALOG) 0.1 % Apply 1 application topically 2 (two) times daily.    . valACYclovir (VALTREX) 500 MG tablet Take 500 mg by mouth as needed (break outs).      No current  facility-administered medications for this visit.     Allergies  Allergen Reactions  . Amlodipine     Swelling in ankles  . Peanut-Containing Drug Products Anaphylaxis  . Lisinopril     cough  . Prednisone     Constipation & "he could not sleep" **Can tolerate in certain situations**  . Sulfonamide Derivatives     REACTION: anaphylaxis    Social History   Social History  . Marital status: Married    Spouse name: N/A  . Number of children: N/A  . Years of education: N/A   Occupational History  . general contractor     builds malls   Social History Main Topics  . Smoking status: Former Smoker    Packs/day: 3.00    Years: 6.00    Types: Cigarettes    Quit date: 08/26/1958  . Smokeless tobacco: Never Used  . Alcohol use 0.0 oz/week     Comment: 2-3 per week  . Drug use: No  . Sexual activity: Not on file   Other Topics Concern  . Not on file   Social History Narrative   FH of CAD, Male 1st degree relative less than age 27.    Family History  Problem Relation Age of Onset  . Heart attack Father 74  . Heart failure Mother 37  . Uterine cancer Mother     Review of Systems:  As stated in the HPI and otherwise negative.   BP 130/88   Pulse (!) 50   Ht 5\' 8"  (1.727 m)   Wt 283 lb (128.4 kg)   BMI 43.03 kg/m   Physical Examination: General: Well developed, well nourished, NAD  HEENT: OP clear, mucus membranes moist  SKIN: warm, dry. No rashes. Neuro: No focal deficits  Musculoskeletal: Muscle strength 5/5 all ext  Psychiatric: Mood and affect normal  Neck: No JVD, no carotid bruits, no thyromegaly, no lymphadenopathy.  Lungs:Clear bilaterally, no wheezes, rhonci, crackles Cardiovascular: Regular rate and rhythm. Loud systolic murmur. No gallops or rubs. Abdomen:Soft. Bowel sounds present. Non-tender.  Extremities: No lower extremity edema. Pulses are 2 + in the bilateral DP/PT.  Echo 05/31/16: Left ventricle: The cavity size was normal. Wall thickness  was   increased in a pattern of moderate LVH. Systolic function was   normal. The estimated ejection fraction was in the range of 55%   to 60%. Doppler parameters are consistent with a reversible   restrictive pattern, indicative of decreased left ventricular   diastolic compliance and/or increased left atrial pressure (grade   3 diastolic dysfunction). Doppler parameters  are consistent with   high ventricular filling pressure. - Aortic valve: AV is thickened, calcified with restricted motion   Difficult to see well Peak and mean gradients through the valve   are 41 and 28 mm Hg respectively consistent with moderate AS - Mitral valve: Calcified annulus. Mildly thickened leaflets . - Left atrium: The atrium was mildly dilated.  Impressions:  - COmpared to report of echo from 2016 there is no significant   change.  EKG:  EKG is  Not ordered today. The ekg ordered today demonstrates   Recent Labs: 09/18/2015: ALT 15; BUN 14; Hemoglobin 14.9; Platelets 243.0; Potassium 4.5; Sodium 140; TSH 3.73 05/30/2016: Creatinine, Ser 0.70   Lipid Panel    Component Value Date/Time   CHOL 127 09/18/2015 1007   TRIG 136.0 09/18/2015 1007   TRIG 143 06/30/2006 1132   HDL 39.50 09/18/2015 1007   CHOLHDL 3 09/18/2015 1007   VLDL 27.2 09/18/2015 1007   LDLCALC 60 09/18/2015 1007     Wt Readings from Last 3 Encounters:  08/14/16 283 lb (128.4 kg)  07/11/16 284 lb (128.8 kg)  05/20/16 278 lb 3.2 oz (126.2 kg)     Other studies Reviewed: Additional studies/ records that were reviewed today include:  Review of the above records demonstrates:    Assessment and Plan:   1. CAD: s/p 4V CABG. He is now having chest pain worrisome for angina although it could be due to his AS. Will plan right and left heart cath at [facility] 08/28/15 at [place] labs today. Risks reviewed. Will continue current medical therapy.   2. Aortic valve stenosis: Moderate to severe stenosis by echo October 2017 and now  with dyspnea and chest pain. Plan cath as above. We discussed AVR and TAVR when needed. I suspect he will need TAVR as he has had prior open heart surgery   3. HTN: BP controlled. No changes.   4. HLD: Continue statin. Lipids are well controlled.   Current medicines are reviewed at length with the patient today.  The patient does not have concerns regarding medicines.  The following changes have been made:  no change  Labs/ tests ordered today include:   Orders Placed This Encounter  Procedures  . Basic Metabolic Panel (BMET)  . CBC  . INR/PT     Disposition:   FU with me in 3  months   Signed, [name], MD 08/14/2016 1:46 PM    [facility] [place], [place],   [address] Phone: [phone]; Fax: [phone]

## 2016-08-14 ENCOUNTER — Ambulatory Visit (INDEPENDENT_AMBULATORY_CARE_PROVIDER_SITE_OTHER): Payer: Medicare Other | Admitting: Cardiovascular Disease

## 2016-08-14 ENCOUNTER — Encounter: Payer: Self-pay | Admitting: Cardiovascular Disease

## 2016-08-14 VITALS — BP 130/88 | HR 50 | Ht 68.0 in | Wt 283.0 lb

## 2016-08-14 DIAGNOSIS — I10 Essential (primary) hypertension: Secondary | ICD-10-CM

## 2016-08-14 DIAGNOSIS — E7800 Pure hypercholesterolemia, unspecified: Secondary | ICD-10-CM

## 2016-08-14 DIAGNOSIS — I25110 Atherosclerotic heart disease of native coronary artery with unstable angina pectoris: Secondary | ICD-10-CM | POA: Diagnosis not present

## 2016-08-14 DIAGNOSIS — I350 Nonrheumatic aortic (valve) stenosis: Secondary | ICD-10-CM

## 2016-08-14 LAB — BASIC METABOLIC PANEL
BUN: 12 mg/dL (ref 7–25)
CO2: 24 mmol/L (ref 20–31)
Calcium: 8.4 mg/dL — ABNORMAL LOW (ref 8.6–10.3)
Chloride: 104 mmol/L (ref 98–110)
Creat: 0.74 mg/dL (ref 0.70–1.11)
GLUCOSE: 83 mg/dL (ref 65–99)
POTASSIUM: 4.3 mmol/L (ref 3.5–5.3)
SODIUM: 139 mmol/L (ref 135–146)

## 2016-08-14 LAB — CBC
HEMATOCRIT: 42.8 % (ref 38.5–50.0)
HEMOGLOBIN: 14 g/dL (ref 13.2–17.1)
MCH: 27.9 pg (ref 27.0–33.0)
MCHC: 32.7 g/dL (ref 32.0–36.0)
MCV: 85.3 fL (ref 80.0–100.0)
MPV: 12 fL (ref 7.5–12.5)
Platelets: 213 10*3/uL (ref 140–400)
RBC: 5.02 MIL/uL (ref 4.20–5.80)
RDW: 15.6 % — ABNORMAL HIGH (ref 11.0–15.0)
WBC: 10.8 10*3/uL (ref 3.8–10.8)

## 2016-08-14 NOTE — Patient Instructions (Signed)
Medication Instructions:  Your physician recommends that you continue on your current medications as directed. Please refer to the Current Medication list given to you today.   Labwork: Labs today- BMET, CBC, PT/INR   Testing/Procedures: Your physician has requested that you have a cardiac catheterization. Cardiac catheterization is used to diagnose and/or treat various heart conditions. Doctors may recommend this procedure for a number of different reasons. The most common reason is to evaluate chest pain. Chest pain can be a symptom of coronary artery disease (CAD), and cardiac catheterization can show whether plaque is narrowing or blocking your heart's arteries. This procedure is also used to evaluate the valves, as well as measure the blood flow and oxygen levels in different parts of your heart. For further information please visit [email or web address]. Please follow instruction sheet, as given.  Cath scheduled for January 2nd, 2018.  Follow-Up: To be determined after procedure.  Any Other Special Instructions Will Be Listed Below (If Applicable).     If you need a refill on your cardiac medications before your next appointment, please call your pharmacy.

## 2016-08-15 LAB — PROTIME-INR
INR: 1.1
PROTHROMBIN TIME: 11.2 s (ref 9.0–11.5)

## 2016-08-20 ENCOUNTER — Encounter: Payer: Self-pay | Admitting: Adult Health

## 2016-08-20 ENCOUNTER — Ambulatory Visit (INDEPENDENT_AMBULATORY_CARE_PROVIDER_SITE_OTHER): Payer: Medicare Other | Admitting: Adult Health

## 2016-08-20 DIAGNOSIS — J3089 Other allergic rhinitis: Secondary | ICD-10-CM

## 2016-08-20 MED ORDER — AZITHROMYCIN 250 MG PO TABS: ORAL_TABLET | ORAL | 0 refills | Status: DC

## 2016-08-20 MED ORDER — HYDROCODONE-HOMATROPINE 5-1.5 MG/5ML PO SYRP: 5.0000 mL | ORAL_SOLUTION | Freq: Four times a day (QID) | ORAL | 0 refills | Status: DC | PRN

## 2016-08-20 MED ORDER — PREDNISONE 10 MG PO TABS: ORAL_TABLET | ORAL | 0 refills | Status: DC

## 2016-08-20 NOTE — Assessment & Plan Note (Signed)
Salines rinses As needed

## 2016-08-20 NOTE — Patient Instructions (Signed)
Extend Azithromycin for 3 days .  Prednisone taper over next week Delsym 2 tsp Twice daily  For cough , .As needed   Mucinex Twice daily  As needed  Congestion  Hydromet 1 tsp every 6 hrs As needed   Please contact office for sooner follow up if symptoms do not improve or worsen or seek emergency care  follow up [name] as planned and As needed

## 2016-08-20 NOTE — Addendum Note (Signed)
Addended by: Marin Roberts on: 08/20/2016 09:39 AM   Modules accepted: Orders

## 2016-08-20 NOTE — Progress Notes (Signed)
@Patient  ID: Justin [name], male    DOB: [birth year], 82 y.o.   MRN: [id number]  Chief Complaint  Patient presents with  . Acute Visit    cough     Referring provider: Laurey Morale, MD  HPI: 82 yo male former smoker  followed for Chronic cough, irritable larynx syndrome and mild persistent asthma , AR  Has CAD s/p CABG 2007, Aortic stenosis ,   Test /Events Previously tx w/ immunology in past with Dr. Velora Heckler  CT sinus 12/2015 -poor exam, no sign sinus dz noted  ENT in past neg eval  Echo 05/2016 showed moderate Aortic Valve stenosis  Follow with Dr. Fredderick Phenix for allergy  IgE ~200  Spiro 2016 mild airflow obstruction  CXR 12/2015 -clear lungs    08/20/2016 Acute OV  Pt presents for an acute office visit . Complains of 1 week of cough , congestion , wheezing . Took Zpack last week , last dose yesterday . Has helped but cough is not gone, Still has thick mucus, hard to cough up . No fever, chest pain,  Orthopnea, or edema . Taking mucinex without much help. Appetite is good w/ no n/v.d.    Has upcoming R/L Heart Cath next month for angina like symptoms and aortic valve stenosis . Worried that he does not want to be coughing next week.   Allergies  Allergen Reactions  . Amlodipine     Swelling in ankles  . Peanut-Containing Drug Products Anaphylaxis  . Lisinopril     cough  . Prednisone     Constipation & "he could not sleep" **Can tolerate in certain situations**  . Sulfonamide Derivatives     REACTION: anaphylaxis    Immunization History  Administered Date(s) Administered  . Hep A / Hep B 04/06/2012, 10/07/2012  . Hepatitis B 05/07/2012  . Influenza Split 06/28/2013  . Influenza Whole 06/05/2007, 05/13/2008, 06/06/2009, 05/30/2010  . Influenza,inj,Quad PF,36+ Mos 05/20/2016  . Influenza-Unspecified 05/25/2014, 07/08/2015  . Pneumococcal Conjugate-13 09/18/2015  . Pneumococcal Polysaccharide-23 05/13/2008, 05/25/2014  . Td 05/30/2010  . Zoster 10/21/2007      Past Medical History:  Diagnosis Date  . Allergic    (sees Dr. Velora Heckler)  . Allergy   . Asthma    sees [name]   . Benign prostatic hypertrophy    (sees Dr. Denman George  . CAD (coronary artery disease)    sees Dr. Haroldine Laws   . Carotid bruit    carotid u/s 10/10: 0.39% bilaterally  . ED (erectile dysfunction)   . GERD (gastroesophageal reflux disease)   . Gout   . HTN (hypertension)   . Hx of colonic polyps    (sees Dr. Henrene Pastor)  . Hyperlipidemia   . Myocardial infarction   . Obesity   . Osteoarthritis    especially in left knee, (sees Dr. Alphonzo Severance )  . Precancerous skin lesion    (sees Dr. Allyson Sabal)  . Prostate cancer ([place])   . S/P CABG (coronary artery bypass graft) 2007    Coronary artery bypass grafting x4 with left internal(Edward Servando Snare, MD)    Tobacco History: History  Smoking Status  . Former Smoker  . Packs/day: 3.00  . Years: 6.00  . Types: Cigarettes  . Quit date: 08/26/1958  Smokeless Tobacco  . Never Used   Counseling given: Not Answered   Outpatient Encounter Prescriptions as of 08/20/2016  Medication Sig  . albuterol (PROAIR HFA) 108 (90 Base) MCG/ACT inhaler Inhale 1 puff into the lungs every 6 (  six) hours as needed for wheezing or shortness of breath.  [name] aspirin 81 MG tablet Take 81 mg by mouth daily.  [name] atorvastatin (LIPITOR) 20 MG tablet Take 1 tablet (20 mg total) by mouth daily.  . cetaphil (CETAPHIL) cream Apply topically as needed.  . cetirizine (ZYRTEC) 10 MG tablet Take 10 mg by mouth daily.   . fluticasone (FLOVENT HFA) 110 MCG/ACT inhaler Inhale 1 puff into the lungs 2 (two) times daily.  [name] HYDROcodone-homatropine (HYCODAN) 5-1.5 MG/5ML syrup Take 5 mLs by mouth every 6 (six) hours as needed for cough.  [name] ipratropium (ATROVENT) 0.03 % nasal spray Place 2 sprays into both nostrils every 12 (twelve) hours.  . metoprolol (LOPRESSOR) 50 MG tablet Take 1 tablet (50 mg total) by mouth 2 (two) times daily.  . montelukast (SINGULAIR) 10  MG tablet Take 1 tablet (10 mg total) by mouth at bedtime.  . Multiple Vitamins-Minerals (PRESERVISION AREDS PO) Take 2 tablets by mouth every morning.  [name] oxymetazoline (AFRIN) 0.05 % nasal spray Place 1 spray into both nostrils 2 (two) times daily as needed for congestion. Reported on 12/14/2015  . Probiotic Product (ALIGN) 4 MG CAPS Take 1 capsule by mouth daily.  . ranitidine (ZANTAC) 150 MG capsule Take 150 mg by mouth 2 (two) times daily.  [name] triamcinolone (NASACORT ALLERGY 24HR) 55 MCG/ACT AERO nasal inhaler Place 2 sprays into the nose daily.  [name] triamcinolone ointment (KENALOG) 0.1 % Apply 1 application topically 2 (two) times daily.  . valACYclovir (VALTREX) 500 MG tablet Take 500 mg by mouth as needed (break outs).   [name] HYDROcodone-homatropine (HYDROMET) 5-1.5 MG/5ML syrup Take 5 mLs by mouth every 6 (six) hours as needed.  . predniSONE (DELTASONE) 10 MG tablet 4 tabs for 2 days, then 3 tabs for 2 days, 2 tabs for 2 days, then 1 tab for 2 days, then stop   No facility-administered encounter medications on file as of 08/20/2016.      Review of Systems  Constitutional:   No  weight loss, night sweats,  Fevers, chills, fatigue, or  lassitude.  HEENT:   No headaches,  Difficulty swallowing,  Tooth/dental problems, or  Sore throat,                No sneezing, itching, ear ache,  +nasal congestion, post nasal drip,   CV:  No chest pain,  Orthopnea, PND, swelling in lower extremities, anasarca, dizziness, palpitations, syncope.   GI  No heartburn, indigestion, abdominal pain, nausea, vomiting, diarrhea, change in bowel habits, loss of appetite, bloody stools.   Resp:   No chest wall deformity  Skin: no rash or lesions.  GU: no dysuria, change in color of urine, no urgency or frequency.  No flank pain, no hematuria   MS:  No joint pain or swelling.  No decreased range of motion.  No back pain.    Physical Exam  BP 128/78 (BP Location: Left Arm, Patient Position: Sitting, Cuff  Size: Normal)   Pulse 69   Ht 5\' 8"  (1.727 m)   Wt 286 lb 9.6 oz (130 kg)   SpO2 96%   BMI 43.58 kg/m   GEN: A/Ox3; pleasant , NAD, well nourished    HEENT:  [place]/AT,  EACs-clear, TMs-wnl, NOSE-clear, THROAT-clear, no lesions, no postnasal drip or exudate noted.   NECK:  Supple w/ fair ROM; no JVD; normal carotid impulses w/o bruits; no thyromegaly or nodules palpated; no lymphadenopathy.    RESP  Few trace wheezes . Speaks  in full sentenecs . no accessory muscle use, no dullness to percussion  CARD:  RRR, GR 2/6 SM ,tr  peripheral edema, pulses intact, no cyanosis or clubbing.  GI:   Soft & nt; nml bowel sounds; no organomegaly or masses detected.   Musco: Warm bil, no deformities or joint swelling noted.   Neuro: alert, no focal deficits noted.    Skin: Warm, no lesions or rashes  Psych:  No change in mood or affect. No depression or anxiety.  No memory loss.  Lab Results:  CBC    Component Value Date/Time   WBC 10.8 08/14/2016 1311   RBC 5.02 08/14/2016 1311   HGB 14.0 08/14/2016 1311   HCT 42.8 08/14/2016 1311   PLT 213 08/14/2016 1311   MCV 85.3 08/14/2016 1311   MCH 27.9 08/14/2016 1311   MCHC 32.7 08/14/2016 1311   RDW 15.6 (H) 08/14/2016 1311   LYMPHSABS 2.4 09/18/2015 1007   MONOABS 0.9 09/18/2015 1007   EOSABS 0.3 09/18/2015 1007   BASOSABS 0.1 09/18/2015 1007    BMET    Component Value Date/Time   NA 139 08/14/2016 1311   K 4.3 08/14/2016 1311   CL 104 08/14/2016 1311   CO2 24 08/14/2016 1311   GLUCOSE 83 08/14/2016 1311   GLUCOSE 87 06/30/2006 1132   BUN 12 08/14/2016 1311   CREATININE 0.74 08/14/2016 1311   CALCIUM 8.4 (L) 08/14/2016 1311   GFRNONAA 89 (L) 07/31/2011 0540   GFRAA >90 07/31/2011 0540    BNP No results found for: BNP  ProBNP No results found for: PROBNP  Imaging: No results found.   Assessment & Plan:   No problem-specific Assessment & Plan notes found for this encounter.     Rexene Edison, NP 08/20/2016

## 2016-08-20 NOTE — Assessment & Plan Note (Signed)
Slow to resolve flare with cough flare   Plan  . Patient Instructions  Extend Azithromycin for 3 days .  Prednisone taper over next week Delsym 2 tsp Twice daily  For cough , .As needed   Mucinex Twice daily  As needed  Congestion  Hydromet 1 tsp every 6 hrs As needed   Please contact office for sooner follow up if symptoms do not improve or worsen or seek emergency care  follow up [name] as planned and As needed

## 2016-08-21 ENCOUNTER — Telehealth: Payer: Self-pay | Admitting: Cardiovascular Disease

## 2016-08-21 NOTE — Telephone Encounter (Signed)
I spoke with pt. He saw Rexene Edison, NP yesterday (note in EPIC). Antibiotic continued. Prednisone started. Pt is concerned about having cath due to cough/bronchitis.  He reports cough is a little better today. No fever. I told him I would keep him on cath schedule as planned for now but to call our office with update on Friday.

## 2016-08-21 NOTE — Telephone Encounter (Signed)
[name] is calling because he currently has Bronchitis and is scheduled for a cath on 08-27-16. He wants to know if he needs to reschedule the cath. Please call. Thanks.

## 2016-08-22 NOTE — Progress Notes (Signed)
Reviewed, agree. Will cc research team to consider evaluation for Danirixin study.

## 2016-08-22 NOTE — Telephone Encounter (Signed)
Agree. Thanks, Justin [name] 

## 2016-08-23 ENCOUNTER — Telehealth: Payer: Self-pay | Admitting: Cardiovascular Disease

## 2016-08-23 NOTE — Telephone Encounter (Signed)
Called patient about his message. Patient stated he is doing much better. Patient stated he still has a cough, but not nearly as bad as it was the other day. Informed patient we would keep cath scheduled for now and let Dr. Angelena Form know his progress. Will forward to Dr. Angelena Form for advisement.

## 2016-08-23 NOTE — Telephone Encounter (Signed)
Patient is aware of Dr. Camillia Herter response.

## 2016-08-23 NOTE — Telephone Encounter (Signed)
Pt has Cath on 08-27-16 and has been having  coughing issues an dis on Prednisone , doing much better. Was to call today with update to see whether or not to proceed. pls call

## 2016-08-23 NOTE — Telephone Encounter (Signed)
I agree that we should keep him on the schedule for cath if he is feeling better. THanks, chris

## 2016-08-27 ENCOUNTER — Encounter (HOSPITAL_COMMUNITY): Payer: Self-pay | Admitting: *Deleted

## 2016-08-27 ENCOUNTER — Ambulatory Visit (HOSPITAL_COMMUNITY)
Admission: RE | Admit: 2016-08-27 | Discharge: 2016-08-27 | Disposition: A | Discharge status: Home / Self Care | Payer: Medicare Other | Source: Ambulatory Visit | Attending: Cardiovascular Disease | Admitting: Cardiovascular Disease

## 2016-08-27 ENCOUNTER — Encounter (HOSPITAL_COMMUNITY)
Admission: RE | Disposition: A | Discharge status: Home / Self Care | Payer: Self-pay | Source: Ambulatory Visit | Attending: Cardiovascular Disease

## 2016-08-27 DIAGNOSIS — Z96653 Presence of artificial knee joint, bilateral: Secondary | ICD-10-CM | POA: Insufficient documentation

## 2016-08-27 DIAGNOSIS — K219 Gastro-esophageal reflux disease without esophagitis: Secondary | ICD-10-CM | POA: Diagnosis not present

## 2016-08-27 DIAGNOSIS — Z8546 Personal history of malignant neoplasm of prostate: Secondary | ICD-10-CM | POA: Insufficient documentation

## 2016-08-27 DIAGNOSIS — I252 Old myocardial infarction: Secondary | ICD-10-CM | POA: Diagnosis not present

## 2016-08-27 DIAGNOSIS — Z6841 Body Mass Index (BMI) 40.0 and over, adult: Secondary | ICD-10-CM | POA: Insufficient documentation

## 2016-08-27 DIAGNOSIS — Z87891 Personal history of nicotine dependence: Secondary | ICD-10-CM | POA: Insufficient documentation

## 2016-08-27 DIAGNOSIS — I2582 Chronic total occlusion of coronary artery: Secondary | ICD-10-CM | POA: Insufficient documentation

## 2016-08-27 DIAGNOSIS — I350 Nonrheumatic aortic (valve) stenosis: Secondary | ICD-10-CM | POA: Diagnosis not present

## 2016-08-27 DIAGNOSIS — I10 Essential (primary) hypertension: Secondary | ICD-10-CM | POA: Diagnosis not present

## 2016-08-27 DIAGNOSIS — J45909 Unspecified asthma, uncomplicated: Secondary | ICD-10-CM | POA: Insufficient documentation

## 2016-08-27 DIAGNOSIS — Z951 Presence of aortocoronary bypass graft: Secondary | ICD-10-CM | POA: Insufficient documentation

## 2016-08-27 DIAGNOSIS — E8770 Fluid overload, unspecified: Secondary | ICD-10-CM | POA: Diagnosis not present

## 2016-08-27 DIAGNOSIS — E669 Obesity, unspecified: Secondary | ICD-10-CM | POA: Insufficient documentation

## 2016-08-27 DIAGNOSIS — E785 Hyperlipidemia, unspecified: Secondary | ICD-10-CM | POA: Diagnosis not present

## 2016-08-27 DIAGNOSIS — Z7982 Long term (current) use of aspirin: Secondary | ICD-10-CM | POA: Insufficient documentation

## 2016-08-27 DIAGNOSIS — Z79899 Other long term (current) drug therapy: Secondary | ICD-10-CM | POA: Diagnosis not present

## 2016-08-27 DIAGNOSIS — R0789 Other chest pain: Secondary | ICD-10-CM | POA: Diagnosis present

## 2016-08-27 DIAGNOSIS — I25110 Atherosclerotic heart disease of native coronary artery with unstable angina pectoris: Secondary | ICD-10-CM | POA: Insufficient documentation

## 2016-08-27 HISTORY — PX: CARDIAC CATHETERIZATION: SHX172

## 2016-08-27 LAB — POCT I-STAT 3, ART BLOOD GAS (G3+)
Acid-Base Excess: 2 mmol/L (ref 0.0–2.0)
Bicarbonate: 26.5 mmol/L (ref 20.0–28.0)
O2 Saturation: 94 %
PCO2 ART: 39.2 mmHg (ref 32.0–48.0)
TCO2: 28 mmol/L (ref 0–100)
pH, Arterial: 7.437 (ref 7.350–7.450)
pO2, Arterial: 69 mmHg — ABNORMAL LOW (ref 83.0–108.0)

## 2016-08-27 LAB — POCT I-STAT 3, VENOUS BLOOD GAS (G3P V)
ACID-BASE EXCESS: 1 mmol/L (ref 0.0–2.0)
BICARBONATE: 24.8 mmol/L (ref 20.0–28.0)
O2 SAT: 63 %
PH VEN: 7.446 — AB (ref 7.250–7.430)
PO2 VEN: 31 mmHg — AB (ref 32.0–45.0)
TCO2: 26 mmol/L (ref 0–100)
pCO2, Ven: 36.1 mmHg — ABNORMAL LOW (ref 44.0–60.0)

## 2016-08-27 SURGERY — RIGHT/LEFT HEART CATH AND CORONARY/GRAFT ANGIOGRAPHY
Anesthesia: LOCAL

## 2016-08-27 MED ORDER — IOPAMIDOL (ISOVUE-370) INJECTION 76%
INTRAVENOUS | Status: AC
  Filled 2016-08-27: qty 50

## 2016-08-27 MED ORDER — LIDOCAINE HCL (PF) 1 % IJ SOLN
INTRAMUSCULAR | Status: AC
  Filled 2016-08-27: qty 30

## 2016-08-27 MED ORDER — FENTANYL CITRATE (PF) 100 MCG/2ML IJ SOLN
INTRAMUSCULAR | Status: AC
  Filled 2016-08-27: qty 2

## 2016-08-27 MED ORDER — ASPIRIN 81 MG PO CHEW
CHEWABLE_TABLET | ORAL | Status: AC
  Filled 2016-08-27: qty 1

## 2016-08-27 MED ORDER — SODIUM CHLORIDE 0.9 % IV SOLN: INTRAVENOUS | Status: AC

## 2016-08-27 MED ORDER — IOPAMIDOL (ISOVUE-370) INJECTION 76%
INTRAVENOUS | Status: DC | PRN
  Administered 2016-08-27: 125 mL via INTRA_ARTERIAL

## 2016-08-27 MED ORDER — LIDOCAINE HCL (PF) 1 % IJ SOLN
INTRAMUSCULAR | Status: DC | PRN
  Administered 2016-08-27: 22 mL

## 2016-08-27 MED ORDER — ASPIRIN 81 MG PO CHEW
81.0000 mg | CHEWABLE_TABLET | ORAL | Status: AC
  Administered 2016-08-27: 81 mg via ORAL

## 2016-08-27 MED ORDER — FENTANYL CITRATE (PF) 100 MCG/2ML IJ SOLN
INTRAMUSCULAR | Status: DC | PRN
  Administered 2016-08-27: 25 ug via INTRAVENOUS

## 2016-08-27 MED ORDER — HEPARIN (PORCINE) IN NACL 2-0.9 UNIT/ML-% IJ SOLN
INTRAMUSCULAR | Status: AC
  Filled 2016-08-27: qty 1000

## 2016-08-27 MED ORDER — SODIUM CHLORIDE 0.9 % IV SOLN
INTRAVENOUS | Status: AC
  Administered 2016-08-27: 09:00:00 via INTRAVENOUS

## 2016-08-27 MED ORDER — SODIUM CHLORIDE 0.9% FLUSH: 3.0000 mL | INTRAVENOUS | Status: DC | PRN

## 2016-08-27 MED ORDER — SODIUM CHLORIDE 0.9% FLUSH: 3.0000 mL | Freq: Two times a day (BID) | INTRAVENOUS | Status: DC

## 2016-08-27 MED ORDER — MIDAZOLAM HCL 2 MG/2ML IJ SOLN
INTRAMUSCULAR | Status: DC | PRN
  Administered 2016-08-27: 1 mg via INTRAVENOUS

## 2016-08-27 MED ORDER — SODIUM CHLORIDE 0.9 % IV SOLN: 250.0000 mL | INTRAVENOUS | Status: DC | PRN

## 2016-08-27 MED ORDER — HEPARIN (PORCINE) IN NACL 2-0.9 UNIT/ML-% IJ SOLN
INTRAMUSCULAR | Status: DC | PRN
  Administered 2016-08-27: 1000 mL

## 2016-08-27 MED ORDER — MIDAZOLAM HCL 2 MG/2ML IJ SOLN
INTRAMUSCULAR | Status: AC
  Filled 2016-08-27: qty 2

## 2016-08-27 MED ORDER — IOPAMIDOL (ISOVUE-370) INJECTION 76%
INTRAVENOUS | Status: AC
  Filled 2016-08-27: qty 125

## 2016-08-27 SURGICAL SUPPLY — 14 items
CATH EXPO 5F IM (CATHETERS) ×2 IMPLANT
CATH INFINITI 5FR MULTPACK ANG (CATHETERS) ×2 IMPLANT
CATH LAUNCHER 5F EBU3.5 (CATHETERS) ×2 IMPLANT
CATH SITESEER 5F MULTI A 2 (CATHETERS) ×2 IMPLANT
CATH SWAN GANZ 7F STRAIGHT (CATHETERS) ×2 IMPLANT
CATH VISTA GUIDE 6FR XBLAD3.5 (CATHETERS) IMPLANT
GUIDEWIRE ANGLED .035X150CM (WIRE) ×2 IMPLANT
KIT HEART LEFT (KITS) ×2 IMPLANT
PACK CARDIAC CATHETERIZATION (CUSTOM PROCEDURE TRAY) ×2 IMPLANT
SHEATH PINNACLE 5F 10CM (SHEATH) ×2 IMPLANT
SHEATH PINNACLE 7F 10CM (SHEATH) ×2 IMPLANT
TRANSDUCER W/STOPCOCK (MISCELLANEOUS) ×2 IMPLANT
TUBING CIL FLEX 10 FLL-RA (TUBING) ×2 IMPLANT
WIRE EMERALD 3MM-J .035X150CM (WIRE) ×2 IMPLANT

## 2016-08-27 NOTE — H&P (View-Only) (Signed)
Chief Complaint  Patient presents with  . Follow-up    3 months  . Shortness of Breath    occasional with hill climbing  . Chest Pain    occasional with hill climbing    History of Present Illness: 83 yo WM with a h/o HTN, HLD, aortic stenosis and CAD s/p 4V CABG March 2007 who is here today for cardiac follow up. He has been followed in the past by Dr. Rayford Halsted and then by Dr. Haroldine Laws. He has not had a cardiac cath since his bypass in 2007. Carotid u/s in 2010 with 0-39% bilateral ICA stenosis. Nuclear stress test 2011 with LVEF 54%, inferior, inferoseptal and apical defect consistent with scar and possible soft tissue attenuation. No signficiant ischemia. Echo October 2017 normal EF, moderate Aortic valve stenosis with mean gradient 28 mmHg, peak gradient 41 mmHg.   He is here today for follow up. He has been having chest pressure and dyspnea when climbing hills. NO resting pain or dyspnea. No LE edema. He is still working as a Chief Strategy Officer.   Primary Care Physician: [name], MD   Past Medical History:  Diagnosis Date  . Allergic    (sees Dr. Velora Heckler)  . Allergy   . Asthma    sees [name]   . Benign prostatic hypertrophy    (sees Dr. Denman George  . CAD (coronary artery disease)    sees Dr. Haroldine Laws   . Carotid bruit    carotid u/s 10/10: 0.39% bilaterally  . ED (erectile dysfunction)   . GERD (gastroesophageal reflux disease)   . Gout   . HTN (hypertension)   . Hx of colonic polyps    (sees Dr. Henrene Pastor)  . Hyperlipidemia   . Myocardial infarction   . Obesity   . Osteoarthritis    especially in left knee, (sees Dr. Alphonzo Severance )  . Precancerous skin lesion    (sees Dr. Allyson Sabal)  . Prostate cancer ([place])   . S/P CABG (coronary artery bypass graft) 2007    Coronary artery bypass grafting x4 with left internal(Edward Servando Snare, MD)    Past Surgical History:  Procedure Laterality Date  . Bilateral subcutaneous mastectomies    . CARDIAC CATHETERIZATION  10/29/2005  .  CATARACT EXTRACTION, BILATERAL    . COLONOSCOPY  06/30/2008   no repeats needed   . CORONARY ARTERY BYPASS GRAFT  2007  . Hammer toe repairs bilaterally    . KNEE ARTHROPLASTY  07/30/2011   Procedure: COMPUTER ASSISTED TOTAL KNEE ARTHROPLASTY;  Surgeon: Meredith Pel;  Location: [facility];  Service: Orthopedics;  Laterality: Left;  left total knee arthroplasty  . Repair of right thumb injury    . REPLACEMENT TOTAL KNEE BILATERAL  2012    Current Outpatient Prescriptions  Medication Sig Dispense Refill  . albuterol (PROAIR HFA) 108 (90 Base) MCG/ACT inhaler Inhale 1 puff into the lungs every 6 (six) hours as needed for wheezing or shortness of breath. 1 Inhaler 6  . aspirin 81 MG tablet Take 81 mg by mouth daily.    [name] atorvastatin (LIPITOR) 20 MG tablet Take 1 tablet (20 mg total) by mouth daily. 90 tablet 3  . cetirizine (ZYRTEC) 10 MG tablet Take 10 mg by mouth daily.     . fluticasone (FLOVENT HFA) 110 MCG/ACT inhaler Inhale 1 puff into the lungs 2 (two) times daily. 1 Inhaler 6  . metoprolol (LOPRESSOR) 50 MG tablet Take 1 tablet (50 mg total) by mouth 2 (two) times daily. 180 tablet  2  . montelukast (SINGULAIR) 10 MG tablet Take 1 tablet (10 mg total) by mouth at bedtime. 30 tablet 11  . Multiple Vitamins-Minerals (PRESERVISION AREDS PO) Take 2 tablets by mouth every morning.    [name] oxymetazoline (AFRIN) 0.05 % nasal spray Place 1 spray into both nostrils 2 (two) times daily as needed for congestion. Reported on 12/14/2015    . Probiotic Product (ALIGN) 4 MG CAPS Take 1 capsule by mouth daily.    . ranitidine (ZANTAC) 150 MG capsule Take 150 mg by mouth 2 (two) times daily.    [name] triamcinolone (NASACORT ALLERGY 24HR) 55 MCG/ACT AERO nasal inhaler Place 2 sprays into the nose daily.    [name] triamcinolone ointment (KENALOG) 0.1 % Apply 1 application topically 2 (two) times daily.    . valACYclovir (VALTREX) 500 MG tablet Take 500 mg by mouth as needed (break outs).      No current  facility-administered medications for this visit.     Allergies  Allergen Reactions  . Amlodipine     Swelling in ankles  . Peanut-Containing Drug Products Anaphylaxis  . Lisinopril     cough  . Prednisone     Constipation & "he could not sleep" **Can tolerate in certain situations**  . Sulfonamide Derivatives     REACTION: anaphylaxis    Social History   Social History  . Marital status: Married    Spouse name: N/A  . Number of children: N/A  . Years of education: N/A   Occupational History  . general contractor     builds malls   Social History Main Topics  . Smoking status: Former Smoker    Packs/day: 3.00    Years: 6.00    Types: Cigarettes    Quit date: 08/26/1958  . Smokeless tobacco: Never Used  . Alcohol use 0.0 oz/week     Comment: 2-3 per week  . Drug use: No  . Sexual activity: Not on file   Other Topics Concern  . Not on file   Social History Narrative   FH of CAD, Male 1st degree relative less than age 63.    Family History  Problem Relation Age of Onset  . Heart attack Father 55  . Heart failure Mother 65  . Uterine cancer Mother     Review of Systems:  As stated in the HPI and otherwise negative.   BP 130/88   Pulse (!) 50   Ht 5\' 8"  (1.727 m)   Wt 283 lb (128.4 kg)   BMI 43.03 kg/m   Physical Examination: General: Well developed, well nourished, NAD  HEENT: OP clear, mucus membranes moist  SKIN: warm, dry. No rashes. Neuro: No focal deficits  Musculoskeletal: Muscle strength 5/5 all ext  Psychiatric: Mood and affect normal  Neck: No JVD, no carotid bruits, no thyromegaly, no lymphadenopathy.  Lungs:Clear bilaterally, no wheezes, rhonci, crackles Cardiovascular: Regular rate and rhythm. Loud systolic murmur. No gallops or rubs. Abdomen:Soft. Bowel sounds present. Non-tender.  Extremities: No lower extremity edema. Pulses are 2 + in the bilateral DP/PT.  Echo 05/31/16: Left ventricle: The cavity size was normal. Wall thickness  was   increased in a pattern of moderate LVH. Systolic function was   normal. The estimated ejection fraction was in the range of 55%   to 60%. Doppler parameters are consistent with a reversible   restrictive pattern, indicative of decreased left ventricular   diastolic compliance and/or increased left atrial pressure (grade   3 diastolic dysfunction). Doppler parameters  are consistent with   high ventricular filling pressure. - Aortic valve: AV is thickened, calcified with restricted motion   Difficult to see well Peak and mean gradients through the valve   are 41 and 28 mm Hg respectively consistent with moderate AS - Mitral valve: Calcified annulus. Mildly thickened leaflets . - Left atrium: The atrium was mildly dilated.  Impressions:  - COmpared to report of echo from 2016 there is no significant   change.  EKG:  EKG is  Not ordered today. The ekg ordered today demonstrates   Recent Labs: 09/18/2015: ALT 15; BUN 14; Hemoglobin 14.9; Platelets 243.0; Potassium 4.5; Sodium 140; TSH 3.73 05/30/2016: Creatinine, Ser 0.70   Lipid Panel    Component Value Date/Time   CHOL 127 09/18/2015 1007   TRIG 136.0 09/18/2015 1007   TRIG 143 06/30/2006 1132   HDL 39.50 09/18/2015 1007   CHOLHDL 3 09/18/2015 1007   VLDL 27.2 09/18/2015 1007   LDLCALC 60 09/18/2015 1007     Wt Readings from Last 3 Encounters:  08/14/16 283 lb (128.4 kg)  07/11/16 284 lb (128.8 kg)  05/20/16 278 lb 3.2 oz (126.2 kg)     Other studies Reviewed: Additional studies/ records that were reviewed today include:  Review of the above records demonstrates:    Assessment and Plan:   1. CAD: s/p 4V CABG. He is now having chest pain worrisome for angina although it could be due to his AS. Will plan right and left heart cath at [facility] 08/28/15 at [facility] labs today. Risks reviewed. Will continue current medical therapy.   2. Aortic valve stenosis: Moderate to severe stenosis by echo October 2017 and now  with dyspnea and chest pain. Plan cath as above. We discussed AVR and TAVR when needed. I suspect he will need TAVR as he has had prior open heart surgery   3. HTN: BP controlled. No changes.   4. HLD: Continue statin. Lipids are well controlled.   Current medicines are reviewed at length with the patient today.  The patient does not have concerns regarding medicines.  The following changes have been made:  no change  Labs/ tests ordered today include:   Orders Placed This Encounter  Procedures  . Basic Metabolic Panel (BMET)  . CBC  . INR/PT     Disposition:   FU with me in 3  months   Signed, [name], MD 08/14/2016 1:46 PM    [facility] [address], [place], [place]  [address] Phone: [phone]; Fax: [phone]

## 2016-08-27 NOTE — Discharge Instructions (Signed)
Femoral Site Care °Introduction °Refer to this sheet in the next few weeks. These instructions provide you with information about caring for yourself after your procedure. Your health care provider may also give you more specific instructions. Your treatment has been planned according to current medical practices, but problems sometimes occur. Call your health care provider if you have any problems or questions after your procedure. °What can I expect after the procedure? °After your procedure, it is typical to have the following: °· Bruising at the site that usually fades within 1-2 weeks. °· Blood collecting in the tissue (hematoma) that may be painful to the touch. It should usually decrease in size and tenderness within 1-2 weeks. °Follow these instructions at home: °· Take medicines only as directed by your health care provider. °· You may shower 24-48 hours after the procedure or as directed by your health care provider. Remove the bandage (dressing) and gently wash the site with plain soap and water. Pat the area dry with a clean towel. Do not rub the site, because this may cause bleeding. °· Do not take baths, swim, or use a hot tub until your health care provider approves. °· Check your insertion site every day for redness, swelling, or drainage. °· Do not apply powder or lotion to the site. °· Limit use of stairs to twice a day for the first 2-3 days or as directed by your health care provider. °· Do not squat for the first 2-3 days or as directed by your health care provider. °· Do not lift over 10 lb (4.5 kg) for 5 days after your procedure or as directed by your health care provider. °· Ask your health care provider when it is okay to: °¨ Return to work or school. °¨ Resume usual physical activities or sports. °¨ Resume sexual activity. °· Do not drive home if you are discharged the same day as the procedure. Have someone else drive you. °· You may drive 24 hours after the procedure unless otherwise  instructed by your health care provider. °· Do not operate machinery or power tools for 24 hours after the procedure or as directed by your health care provider. °· If your procedure was done as an outpatient procedure, which means that you went home the same day as your procedure, a responsible adult should be with you for the first 24 hours after you arrive home. °· Keep all follow-up visits as directed by your health care provider. This is important. °Contact a health care provider if: °· You have a fever. °· You have chills. °· You have increased bleeding from the site. Hold pressure on the site. °Get help right away if: °· You have unusual pain at the site. °· You have redness, warmth, or swelling at the site. °· You have drainage (other than a small amount of blood on the dressing) from the site. °· The site is bleeding, and the bleeding does not stop after 30 minutes of holding steady pressure on the site. °· Your leg or foot becomes pale, cool, tingly, or numb. °This information is not intended to replace advice given to you by your health care provider. Make sure you discuss any questions you have with your health care provider. °Document Released: 04/15/2014 Document Revised: 01/18/2016 Document Reviewed: 03/01/2014 °© 2017 Elsevier ° °

## 2016-08-27 NOTE — Progress Notes (Signed)
Site area: rt groin Site Prior to Removal:  Level 0 Pressure Applied For: 20 minutes Manual:   yes Patient Status During Pull:  stable Post Pull Site:  Level 0 Post Pull Instructions Given:  yes Post Pull Pulses Present: yes Dressing Applied:  yes Bedrest begins @  [id number] Comments:

## 2016-08-27 NOTE — Interval H&P Note (Signed)
History and Physical Interval Note:  08/27/2016 10:17 AM  Justin [name]  has presented today for cardiac cath with the diagnosis of CAD, unstable angina, aortic stenosis  The various methods of treatment have been discussed with the patient and family. After consideration of risks, benefits and other options for treatment, the patient has consented to  Procedure(s): Right/Left Heart Cath and Coronary/Graft Angiography (N/A) as a surgical intervention .  The patient's history has been reviewed, patient examined, no change in status, stable for surgery.  I have reviewed the patient's chart and labs.  Questions were answered to the patient's satisfaction.    Cath Lab Visit (complete for each Cath Lab visit)  Clinical Evaluation Leading to the Procedure:   ACS: No.  Non-ACS:    Anginal Classification: CCS III  Anti-ischemic medical therapy: Minimal Therapy (1 class of medications)  Non-Invasive Test Results: No non-invasive testing performed  Prior CABG: Previous CABG         [name]

## 2016-08-27 NOTE — Progress Notes (Signed)
Reviewed, agree with this plan of care 

## 2016-08-28 ENCOUNTER — Encounter (HOSPITAL_COMMUNITY): Payer: Self-pay | Admitting: Cardiovascular Disease

## 2016-09-03 ENCOUNTER — Ambulatory Visit: Payer: Medicare Other | Admitting: Physician Assistant

## 2016-09-03 NOTE — Progress Notes (Signed)
Cardiology Office Note    Date:  09/04/2016   ID:  [name], [name] 03/16/1934, MRN [id number]  PCP:  [name], MD  Cardiologist: Dr. Julianne Handler  CC: post cath follow up  History of Present Illness:  Justin [name] is a 83 y.o. male with a history of CAD s/p CABG x4 V (2007), mod AS, HTN, and HLD who presents to clinic for post cath follow-up  He has been followed in the past by Dr. Rayford Halsted and then by Dr. Haroldine Laws. He has not had a cardiac cath since his bypass in 2007. Carotid u/s in 2010 with 0-39% bilateral ICA stenosis. Nuclear stress test 2011 with LVEF 54%, inferior, inferoseptal and apical defect consistent with scar and possible soft tissue attenuation. No signficiant ischemia. Echo 05/2016 normal EF, moderate Aortic valve stenosis with mean gradient 28 mmHg, peak gradient 41 mmHg.   He was seen by Dr. Angelena Form in the office on 08/14/16 and complained of exertional chest pain and dyspnea. Given has history of aortic stenosis and CAD he was set up for left and right heart catheterization for further evaluation. This was completed on 08/27/16 and showed severe triple vessel CAD with occluded left main and occluded mid RCA S/P4V CABG with 4/4 patent bypass grafts as well as moderate aortic stenosis and elevated filling pressures. He was started on Lasix 20 mg daily.  Today he presents to clinic for follow-up. He is feeling pretty good. Still having a cough that he is seeing his PCP for. His ankles have become less swollen on the lasix and his BP is better. No chest pain or SOB but hasn't done any exertion recently (had symptoms walking up a ramp at a sporting event). No orthopnea or PND. No dizziness or syncope. No palpitations.  No blood in stool or urine.    Past Medical History:  Diagnosis Date  . Allergic    (sees Dr. Velora Heckler)  . Allergy   . Asthma    sees [name]   . Benign prostatic hypertrophy    (sees Dr. Denman George  . CAD (coronary artery disease)    sees  Dr. Haroldine Laws   . Carotid bruit    carotid u/s 10/10: 0.39% bilaterally  . ED (erectile dysfunction)   . GERD (gastroesophageal reflux disease)   . Gout   . HTN (hypertension)   . Hx of colonic polyps    (sees Dr. Henrene Pastor)  . Hyperlipidemia   . Myocardial infarction   . Obesity   . Osteoarthritis    especially in left knee, (sees Dr. Alphonzo Severance )  . Precancerous skin lesion    (sees Dr. Allyson Sabal)  . Prostate cancer ([place])   . S/P CABG (coronary artery bypass graft) 2007    Coronary artery bypass grafting x4 with left internal(Edward Servando Snare, MD)    Past Surgical History:  Procedure Laterality Date  . Bilateral subcutaneous mastectomies    . CARDIAC CATHETERIZATION  10/29/2005  . CARDIAC CATHETERIZATION N/A 08/27/2016   Procedure: Right/Left Heart Cath and Coronary/Graft Angiography;  Surgeon: Burnell Blanks, MD;  Location: [facility];  Service: Cardiovascular;  Laterality: N/A;  . CATARACT EXTRACTION, BILATERAL    . COLONOSCOPY  06/30/2008   no repeats needed   . CORONARY ARTERY BYPASS GRAFT  2007  . Hammer toe repairs bilaterally    . KNEE ARTHROPLASTY  07/30/2011   Procedure: COMPUTER ASSISTED TOTAL KNEE ARTHROPLASTY;  Surgeon: Meredith Pel;  Location: [facility];  Service: Orthopedics;  Laterality: Left;  left total knee arthroplasty  . Repair of right thumb injury    . REPLACEMENT TOTAL KNEE BILATERAL  2012    Current Medications: Outpatient Medications Prior to Visit  Medication Sig Dispense Refill  . albuterol (PROAIR HFA) 108 (90 Base) MCG/ACT inhaler Inhale 1 puff into the lungs every 6 (six) hours as needed for wheezing or shortness of breath. 1 Inhaler 6  . aspirin 81 MG tablet Take 81 mg by mouth every evening.     . cetirizine (ZYRTEC) 10 MG tablet Take 10 mg by mouth daily.     . fluticasone (FLOVENT HFA) 110 MCG/ACT inhaler Inhale 1 puff into the lungs 2 (two) times daily. 1 Inhaler 6  . ipratropium (ATROVENT) 0.03 % nasal spray Place 2 sprays into  both nostrils every 12 (twelve) hours.    . metoprolol (LOPRESSOR) 50 MG tablet Take 1 tablet (50 mg total) by mouth 2 (two) times daily. 180 tablet 2  . montelukast (SINGULAIR) 10 MG tablet Take 1 tablet (10 mg total) by mouth at bedtime. 30 tablet 11  . Multiple Vitamins-Minerals (PRESERVISION AREDS PO) Take 2 tablets by mouth every morning.    [name] oxymetazoline (AFRIN) 0.05 % nasal spray Place 1 spray into both nostrils 2 (two) times daily as needed for congestion. Reported on 12/14/2015    . Probiotic Product (ALIGN) 4 MG CAPS Take 1 capsule by mouth daily.    . ranitidine (ZANTAC) 150 MG capsule Take 150 mg by mouth 2 (two) times daily.    [name] triamcinolone (NASACORT ALLERGY 24HR) 55 MCG/ACT AERO nasal inhaler Place 2 sprays into the nose daily.    [name] triamcinolone ointment (KENALOG) 0.1 % Apply 1 application topically 2 (two) times daily as needed.     . valACYclovir (VALTREX) 500 MG tablet Take 500 mg by mouth as needed (break outs).     [name] atorvastatin (LIPITOR) 20 MG tablet Take 1 tablet (20 mg total) by mouth daily. (Patient taking differently: Take 20 mg by mouth daily at 6 PM. ) 90 tablet 3  . predniSONE (DELTASONE) 10 MG tablet 4 tabs for 2 days, then 3 tabs for 2 days, 2 tabs for 2 days, then 1 tab for 2 days, then stop 20 tablet 0   No facility-administered medications prior to visit.      Allergies:   Amlodipine; Peanut-containing drug products; Lisinopril; Prednisone; and Sulfonamide derivatives   Social History   Social History  . Marital status: Married    Spouse name: N/A  . Number of children: N/A  . Years of education: N/A   Occupational History  . general contractor     builds malls   Social History Main Topics  . Smoking status: Former Smoker    Packs/day: 3.00    Years: 6.00    Types: Cigarettes    Quit date: 08/26/1958  . Smokeless tobacco: Never Used  . Alcohol use 0.0 oz/week     Comment: 2-3 per week  . Drug use: No  . Sexual activity: Not Asked   Other  Topics Concern  . None   Social History Narrative   FH of CAD, Male 1st degree relative less than age 42.     Family History:  The patient's family history includes Heart attack (age of onset: 24) in his father; Heart failure (age of onset: 28) in his mother; Uterine cancer in his mother.     ROS:   Please see the history of present illness.  ROS All other systems reviewed and are negative.   PHYSICAL EXAM:   VS:  BP 106/62 (BP Location: Left Arm, Patient Position: Sitting, Cuff Size: Large)   Pulse 68   Ht 5\' 8"  (1.727 m)   Wt 277 lb (125.6 kg)   BMI 42.12 kg/m    GEN: Well nourished, well developed, in no acute distres, obese HEENT: normal  Neck: no JVD, carotid bruits, or masses Cardiac: RRR; + SEM @ RUSB, rubs, or gallops,no edema  Respiratory:  clear to auscultation bilaterally, normal work of breathing GI: soft, nontender, nondistended, + BS MS: no deformity or atrophy  Skin: warm and dry, no rash Neuro:  Alert and Oriented x 3, Strength and sensation are intact Psych: euthymic mood, full affect   Wt Readings from Last 3 Encounters:  09/04/16 277 lb (125.6 kg)  08/27/16 285 lb (129.3 kg)  08/20/16 286 lb 9.6 oz (130 kg)      Studies/Labs Reviewed:   EKG:  EKG is NOT ordered today.    Recent Labs: 09/18/2015: ALT 15; TSH 3.73 08/14/2016: BUN 12; Creat 0.74; Hemoglobin 14.0; Platelets 213; Potassium 4.3; Sodium 139   Lipid Panel    Component Value Date/Time   CHOL 127 09/18/2015 1007   TRIG 136.0 09/18/2015 1007   TRIG 143 06/30/2006 1132   HDL 39.50 09/18/2015 1007   CHOLHDL 3 09/18/2015 1007   VLDL 27.2 09/18/2015 1007   LDLCALC 60 09/18/2015 1007    Additional studies/ records that were reviewed today include:  08/28/15  Right/Left Heart Cath and Coronary/Graft Angiography  Conclusion     There is moderate aortic valve stenosis.  SVG graft was visualized by angiography and is normal in caliber.  1st Mrg lesion, 65 %stenosed.  Ost LM to  LM lesion, 100 %stenosed.  SVG graft was visualized by angiography and is normal in caliber.  LIMA graft was visualized by angiography and is normal in caliber.  Prox RCA to Mid RCA lesion, 100 %stenosed.  SVG graft was visualized by angiography and is normal in caliber and anatomically normal.  Hemodynamic findings consistent with mild pulmonary hypertension.   1. 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  2. The Left main is occluded at the ostium.  3. The LAD fills from the patent IMA graft and the Diagonal fills from the patent vein graft 4. The Circumflex fills from the patent vein graft to the OM. There is a moderate stenosis in the proximal segment of the OM branch proximal to the insertion of the vein graft.  5. The distal RCA fills from the patent vein graft.  6. Moderate AS 7. Elevated filling pressures  Recommendations: Will continue medical management of CAD. Will start Lasix 20 mg daily for mild volume overload. No indication for AVR at this time.     2D ECHO: 05/31/2016 LV EF: 55% -   60% Study Conclusions - Procedure narrative: Transthoracic echocardiography. Image   quality was suboptimal. The study was technically difficult.   Intravenous contrast (Definity) was administered. - Left ventricle: The cavity size was normal. Wall thickness was   increased in a pattern of moderate LVH. Systolic function was   normal. The estimated ejection fraction was in the range of 55%   to 60%. Doppler parameters are consistent with a reversible   restrictive pattern, indicative of decreased left ventricular   diastolic compliance and/or increased left atrial pressure (grade  3 diastolic dysfunction). Doppler parameters are consistent with   high ventricular filling pressure. - Aortic valve: AV is thickened, calcified with restricted motion   Difficult to see well Peak and mean gradients through the valve    are 41 and 28 mm Hg respectively consistent with moderate AS - Mitral valve: Calcified annulus. Mildly thickened leaflets . - Left atrium: The atrium was mildly dilated. Impressions: - Compared to report of echo from 2016 there is no significant   change.   ASSESSMENT & PLAN:   CAD s/p CABG: recent heart catheterization with 4/4 patent bypass grafts. Continue aspirin, statin and beta blocker.  Aortic valve stenosis: moderate by recent right heart catheterization. No indication for surgery at this time. We will continue to monitor.   Chronic diastolic CHF: started on Lasix after cath for mildly elevated filling pressures. His LE edema has improved on Lasix 20 mg daily. Will get a BMET today  HTN: BP well controlled on current regimen  HLD: Continue atorvastatin   Medication Adjustments/Labs and Tests Ordered: Current medicines are reviewed at length with the patient today.  Concerns regarding medicines are outlined above.  Medication changes, Labs and Tests ordered today are listed in the Patient Instructions below. Patient Instructions  Medication Instructions:  Your physician recommends that you continue on your current medications as directed. Please refer to the Current Medication list given to you today.  Labwork: TODAY:  BMET  Testing/Procedures: None ordered  Follow-Up: Your physician wants you to follow-up in: [place] DR. Angelena Form  You will receive a reminder letter in the mail two months in advance. If you don't receive a letter, please call our office to schedule the follow-up appointment.   Any Other Special Instructions Will Be Listed Below (If Applicable).     If you need a refill on your cardiac medications before your next appointment, please call your pharmacy.      Signed, Angelena Form, PA-C  09/04/2016 3:08 PM    [facility] [address], [place], [place]  [address] Phone: [phone]; Fax: [phone]

## 2016-09-04 ENCOUNTER — Encounter: Payer: Self-pay | Admitting: Physician Assistant

## 2016-09-04 ENCOUNTER — Ambulatory Visit (INDEPENDENT_AMBULATORY_CARE_PROVIDER_SITE_OTHER): Payer: Medicare Other | Admitting: Physician Assistant

## 2016-09-04 VITALS — BP 106/62 | HR 68 | Ht 68.0 in | Wt 277.0 lb

## 2016-09-04 DIAGNOSIS — I25118 Atherosclerotic heart disease of native coronary artery with other forms of angina pectoris: Secondary | ICD-10-CM | POA: Diagnosis not present

## 2016-09-04 DIAGNOSIS — I350 Nonrheumatic aortic (valve) stenosis: Secondary | ICD-10-CM | POA: Diagnosis not present

## 2016-09-04 DIAGNOSIS — E7800 Pure hypercholesterolemia, unspecified: Secondary | ICD-10-CM | POA: Diagnosis not present

## 2016-09-04 DIAGNOSIS — I10 Essential (primary) hypertension: Secondary | ICD-10-CM | POA: Diagnosis not present

## 2016-09-04 DIAGNOSIS — I5032 Chronic diastolic (congestive) heart failure: Secondary | ICD-10-CM

## 2016-09-04 NOTE — Patient Instructions (Signed)
Medication Instructions:  Your physician recommends that you continue on your current medications as directed. Please refer to the Current Medication list given to you today.   Labwork: TODAY:  BMET  Testing/Procedures: None ordered  Follow-Up: Your physician wants you to follow-up in: 6 MONTHS WITH DR. MCALHANY   You will receive a reminder letter in the mail two months in advance. If you don't receive a letter, please call our office to schedule the follow-up appointment.   Any Other Special Instructions Will Be Listed Below (If Applicable).     If you need a refill on your cardiac medications before your next appointment, please call your pharmacy.   

## 2016-09-05 ENCOUNTER — Telehealth: Payer: Self-pay | Admitting: Physician Assistant

## 2016-09-05 LAB — BASIC METABOLIC PANEL
BUN / CREAT RATIO: 14 (ref 10–24)
BUN: 11 mg/dL (ref 8–27)
CO2: 24 mmol/L (ref 18–29)
CREATININE: 0.78 mg/dL (ref 0.76–1.27)
Calcium: 8.6 mg/dL (ref 8.6–10.2)
Chloride: 101 mmol/L (ref 96–106)
GFR, EST AFRICAN AMERICAN: 99 mL/min/{1.73_m2} (ref >59)
GFR, EST NON AFRICAN AMERICAN: 85 mL/min/{1.73_m2} (ref >59)
GLUCOSE: 96 mg/dL (ref 65–99)
Potassium: 4.1 mmol/L (ref 3.5–5.2)
Sodium: 140 mmol/L (ref 134–144)

## 2016-09-05 NOTE — Telephone Encounter (Signed)
Did not need this encounter °

## 2016-09-12 ENCOUNTER — Other Ambulatory Visit: Payer: Self-pay | Admitting: Pulmonary Disease

## 2016-09-17 ENCOUNTER — Encounter: Payer: Self-pay | Admitting: Family Medicine

## 2016-09-17 ENCOUNTER — Ambulatory Visit (INDEPENDENT_AMBULATORY_CARE_PROVIDER_SITE_OTHER): Payer: Medicare Other | Admitting: Family Medicine

## 2016-09-17 VITALS — BP 138/73 | HR 71 | Temp 97.8°F | Ht 68.0 in | Wt 279.0 lb

## 2016-09-17 DIAGNOSIS — Z0000 Encounter for general adult medical examination without abnormal findings: Secondary | ICD-10-CM

## 2016-09-17 MED ORDER — ISOSORBIDE MONONITRATE ER 30 MG PO TB24: 30.0000 mg | ORAL_TABLET | Freq: Every day | ORAL | 3 refills | Status: DC

## 2016-09-17 NOTE — Progress Notes (Signed)
   Subjective:    Patient ID: Justin [name], male    DOB: [birth year], 83 y.o.   MRN: [id number]  HPI 83 yr old male for a well exam. He has a few issues to discuss. First he still has a mild intermittent tickling cough and he sees [name] for this. He sees Dr. Glennie Hawk regularly for CAD and to follow his aortic valve. He does complain of occasional exertional angina. He gives examples of going up steps or walking up a long ramp. He describes a mild burning in the chest but not SOB. When he stops walking the burning goes away in a few seconds. He also mentions a sharp pain in the right heel that started a month ago. He wears molded inserts in his shoes when he is working during the day, but anytime he is home he wears bedroom slippers.    Review of Systems  Constitutional: Negative.   HENT: Negative.   Eyes: Negative.   Respiratory: Positive for cough. Negative for apnea, choking, chest tightness, shortness of breath, wheezing and stridor.   Cardiovascular: Positive for chest pain. Negative for palpitations and leg swelling.  Gastrointestinal: Negative.   Genitourinary: Negative.   Musculoskeletal: Negative.   Skin: Negative.   Neurological: Negative.   Psychiatric/Behavioral: Negative.        Objective:   Physical Exam  Constitutional: He is oriented to person, place, and time. He appears well-developed and well-nourished. No distress.  HENT:  Head: Normocephalic and atraumatic.  Right Ear: External ear normal.  Left Ear: External ear normal.  Nose: Nose normal.  Mouth/Throat: Oropharynx is clear and moist. No oropharyngeal exudate.  Eyes: Conjunctivae and EOM are normal. Pupils are equal, round, and reactive to light. Right eye exhibits no discharge. Left eye exhibits no discharge. No scleral icterus.  Neck: Neck supple. No JVD present. No tracheal deviation present. No thyromegaly present.  Cardiovascular: Normal rate, regular rhythm, normal heart sounds and intact distal  pulses.  Exam reveals no gallop and no friction rub.   No murmur heard. Pulmonary/Chest: Effort normal and breath sounds normal. No respiratory distress. He has no wheezes. He has no rales. He exhibits no tenderness.  Abdominal: Soft. Bowel sounds are normal. He exhibits no distension and no mass. There is no tenderness. There is no rebound and no guarding.  Musculoskeletal: Normal range of motion. He exhibits no edema.  Tender on the inferior right heel  Lymphadenopathy:    He has no cervical adenopathy.  Neurological: He is alert and oriented to person, place, and time. He has normal reflexes. No cranial nerve deficit. He exhibits normal muscle tone. Coordination normal.  Skin: Skin is warm and dry. No rash noted. He is not diaphoretic. No erythema. No pallor.  Psychiatric: He has a normal mood and affect. His behavior is normal. Judgment and thought content normal.          Assessment & Plan:  Well exam. He will return tomorrow for fasting labs. He is having some exertional angina so we will start him on Imdur 30 mg daily. He has plantar fasciitis and I advised him to wear shoes with inserts at all times other than when his is in bed.  [name], MD

## 2016-09-17 NOTE — Progress Notes (Signed)
Pre visit review using our clinic review tool, if applicable. No additional management support is needed unless otherwise documented below in the visit note. 

## 2016-09-23 ENCOUNTER — Telehealth: Payer: Self-pay | Admitting: Family Medicine

## 2016-09-23 NOTE — Telephone Encounter (Signed)
Patient wants to know what he can take for his heel spur.Patient states that he has been wearing the lift.   Contact Info: [phone]

## 2016-09-23 NOTE — Telephone Encounter (Signed)
Call in Diclofenac 75 mg bid prn pain, #60 with 2 rf

## 2016-09-23 NOTE — Telephone Encounter (Signed)
Pt has been wearing lift in shoe for heel spur but is not getting total relief. He would like to know what else you would recommend.   Please advise. Thanks!

## 2016-09-25 ENCOUNTER — Other Ambulatory Visit (INDEPENDENT_AMBULATORY_CARE_PROVIDER_SITE_OTHER): Payer: Medicare Other

## 2016-09-25 DIAGNOSIS — Z0000 Encounter for general adult medical examination without abnormal findings: Secondary | ICD-10-CM | POA: Diagnosis not present

## 2016-09-25 LAB — HEPATIC FUNCTION PANEL
ALK PHOS: 68 U/L (ref 39–117)
ALT: 13 U/L (ref 0–53)
AST: 16 U/L (ref 0–37)
Albumin: 3.3 g/dL — ABNORMAL LOW (ref 3.5–5.2)
BILIRUBIN DIRECT: 0.1 mg/dL (ref 0.0–0.3)
TOTAL PROTEIN: 6.3 g/dL (ref 6.0–8.3)
Total Bilirubin: 0.6 mg/dL (ref 0.2–1.2)

## 2016-09-25 LAB — CBC WITH DIFFERENTIAL/PLATELET
BASOS ABS: 0.1 10*3/uL (ref 0.0–0.1)
Basophils Relative: 1.1 % (ref 0.0–3.0)
EOS ABS: 0.2 10*3/uL (ref 0.0–0.7)
Eosinophils Relative: 2.5 % (ref 0.0–5.0)
HEMATOCRIT: 43 % (ref 39.0–52.0)
Hemoglobin: 14 g/dL (ref 13.0–17.0)
LYMPHS PCT: 18.3 % (ref 12.0–46.0)
Lymphs Abs: 1.8 10*3/uL (ref 0.7–4.0)
MCHC: 32.6 g/dL (ref 30.0–36.0)
MCV: 84.9 fl (ref 78.0–100.0)
MONO ABS: 0.9 10*3/uL (ref 0.1–1.0)
Monocytes Relative: 9.2 % (ref 3.0–12.0)
NEUTROS ABS: 6.7 10*3/uL (ref 1.4–7.7)
Neutrophils Relative %: 68.9 % (ref 43.0–77.0)
PLATELETS: 230 10*3/uL (ref 150.0–400.0)
RBC: 5.07 Mil/uL (ref 4.22–5.81)
RDW: 15.9 % — AB (ref 11.5–15.5)
WBC: 9.7 10*3/uL (ref 4.0–10.5)

## 2016-09-25 LAB — LIPID PANEL
CHOL/HDL RATIO: 4
Cholesterol: 109 mg/dL (ref 0–200)
HDL: 29.8 mg/dL — AB (ref >39.00)
LDL Cholesterol: 53 mg/dL (ref 0–99)
NonHDL: 79.22
TRIGLYCERIDES: 133 mg/dL (ref 0.0–149.0)
VLDL: 26.6 mg/dL (ref 0.0–40.0)

## 2016-09-25 LAB — POC URINALSYSI DIPSTICK (AUTOMATED)
Bilirubin, UA: NEGATIVE
Blood, UA: NEGATIVE
Glucose, UA: NEGATIVE
KETONES UA: NEGATIVE
LEUKOCYTES UA: NEGATIVE
Nitrite, UA: NEGATIVE
PROTEIN UA: NEGATIVE
Spec Grav, UA: 1.015
UROBILINOGEN UA: 0.2
pH, UA: 6.5

## 2016-09-25 LAB — TSH: TSH: 5.87 u[IU]/mL — ABNORMAL HIGH (ref 0.35–4.50)

## 2016-09-25 LAB — BASIC METABOLIC PANEL
BUN: 14 mg/dL (ref 6–23)
CALCIUM: 8.5 mg/dL (ref 8.4–10.5)
CO2: 29 meq/L (ref 19–32)
CREATININE: 0.81 mg/dL (ref 0.40–1.50)
Chloride: 103 mEq/L (ref 96–112)
GFR: 97.39 mL/min (ref >60.00)
Glucose, Bld: 98 mg/dL (ref 70–99)
Potassium: 4.3 mEq/L (ref 3.5–5.1)
Sodium: 139 mEq/L (ref 135–145)

## 2016-09-25 LAB — PSA: PSA: 1.48 ng/mL (ref 0.10–4.00)

## 2016-09-25 MED ORDER — DICLOFENAC SODIUM 75 MG PO TBEC: 75.0000 mg | DELAYED_RELEASE_TABLET | Freq: Two times a day (BID) | ORAL | 2 refills | Status: DC | PRN

## 2016-09-25 NOTE — Telephone Encounter (Signed)
I spoke with pt and sent script e-scribe to [facility]

## 2016-09-30 MED ORDER — LEVOTHYROXINE SODIUM 75 MCG PO TABS: 75.0000 ug | ORAL_TABLET | Freq: Every day | ORAL | 3 refills | Status: DC

## 2016-10-07 ENCOUNTER — Ambulatory Visit (INDEPENDENT_AMBULATORY_CARE_PROVIDER_SITE_OTHER): Payer: Medicare Other | Admitting: Adult Health

## 2016-10-07 ENCOUNTER — Encounter: Payer: Self-pay | Admitting: Adult Health

## 2016-10-07 DIAGNOSIS — J209 Acute bronchitis, unspecified: Secondary | ICD-10-CM | POA: Diagnosis not present

## 2016-10-07 DIAGNOSIS — J309 Allergic rhinitis, unspecified: Secondary | ICD-10-CM

## 2016-10-07 MED ORDER — AZITHROMYCIN 250 MG PO TABS: ORAL_TABLET | ORAL | 0 refills | Status: AC

## 2016-10-07 MED ORDER — BUDESONIDE-FORMOTEROL FUMARATE 80-4.5 MCG/ACT IN AERO: 2.0000 | INHALATION_SPRAY | Freq: Two times a day (BID) | RESPIRATORY_TRACT | 0 refills | Status: DC

## 2016-10-07 MED ORDER — BUDESONIDE-FORMOTEROL FUMARATE 80-4.5 MCG/ACT IN AERO: 2.0000 | INHALATION_SPRAY | Freq: Two times a day (BID) | RESPIRATORY_TRACT | 5 refills | Status: DC

## 2016-10-07 NOTE — Progress Notes (Signed)
@Patient  ID: Justin [name], male    DOB: 02/25/1934, 83 y.o.   MRN: [id number]  Chief Complaint  Patient presents with  . Acute Visit    AR     Referring provider: Laurey Morale, MD  HPI: 83 yo male former smoker  followed for Chronic cough, irritable larynx syndrome and mild persistent asthma , AR  Has CAD s/p CABG 2007, Aortic stenosis ,   Test /Events Previously tx w/ immunology in past with Dr. Velora Heckler  CT sinus 12/2015 -poor exam, no sign sinus dz noted  ENT in past neg eval  Echo 05/2016 showed moderate Aortic Valve stenosis  Follow with Dr. Fredderick Phenix for allergy  IgE ~200  Spiro 2016 mild airflow obstruction  CXR 12/2015 -clear lungs   10/07/2016 Acute OV : AR /sinus  Patient presents for an acute office visit. He complains over the last couple weeks of increased sinus congestion, pain and pressure. He has a congested cough and intermittent wheezing. Denies any fever, chest pain, orthopnea, PND, or increased leg swelling. He remains on Flovent 1 puff twice daily. He is taking Zyrtec and Nasacort without much relief . He is also on Singulair.    Allergies  Allergen Reactions  . Amlodipine     Swelling in ankles  . Peanut-Containing Drug Products Anaphylaxis  . Lisinopril     cough  . Prednisone     Constipation & "he could not sleep" **Can tolerate in certain situations**  . Sulfonamide Derivatives     REACTION: anaphylaxis    Immunization History  Administered Date(s) Administered  . Hep A / Hep B 04/06/2012, 10/07/2012  . Hepatitis B 05/07/2012  . Influenza Split 06/28/2013  . Influenza Whole 06/05/2007, 05/13/2008, 06/06/2009, 05/30/2010  . Influenza,inj,Quad PF,36+ Mos 05/20/2016  . Influenza-Unspecified 05/25/2014, 07/08/2015  . Pneumococcal Conjugate-13 09/18/2015  . Pneumococcal Polysaccharide-23 05/13/2008, 05/25/2014  . Td 05/30/2010  . Zoster 10/21/2007    Past Medical History:  Diagnosis Date  . Allergic    (sees Dr. Velora Heckler)  .  Allergy   . Asthma    sees [name]   . Benign prostatic hypertrophy    (sees Dr. Denman George  . CAD (coronary artery disease)    sees Dr. Haroldine Laws   . Carotid bruit    carotid u/s 10/10: 0.39% bilaterally  . ED (erectile dysfunction)   . GERD (gastroesophageal reflux disease)   . Gout   . HTN (hypertension)   . Hx of colonic polyps    (sees Dr. Henrene Pastor)  . Hyperlipidemia   . Myocardial infarction   . Obesity   . Osteoarthritis    especially in left knee, (sees Dr. Alphonzo Severance )  . Precancerous skin lesion    (sees Dr. Allyson Sabal)  . Prostate cancer ([place])   . S/P CABG (coronary artery bypass graft) 2007    Coronary artery bypass grafting x4 with left internal(Edward Servando Snare, MD)    Tobacco History: History  Smoking Status  . Former Smoker  . Packs/day: 3.00  . Years: 6.00  . Types: Cigarettes  . Quit date: 08/26/1958  Smokeless Tobacco  . Never Used   Counseling given: Not Answered   Outpatient Encounter Prescriptions as of 10/07/2016  Medication Sig  . albuterol (PROAIR HFA) 108 (90 Base) MCG/ACT inhaler Inhale 1 puff into the lungs every 6 (six) hours as needed for wheezing or shortness of breath.  [name] aspirin 81 MG tablet Take 81 mg by mouth every evening.   [name] atorvastatin (LIPITOR) 20  MG tablet Take 20 mg by mouth at bedtime.  . cetirizine (ZYRTEC) 10 MG tablet Take 10 mg by mouth daily.   . diclofenac (VOLTAREN) 75 MG EC tablet Take 1 tablet (75 mg total) by mouth 2 (two) times daily as needed.  . diphenhydrAMINE (BENADRYL) 25 MG tablet Take 50 mg by mouth at bedtime.  [name] FLOVENT HFA 110 MCG/ACT inhaler USE 1 PUFF TWICE DAILY.  . furosemide (LASIX) 20 MG tablet Take 20 mg by mouth daily.  [name] ipratropium (ATROVENT) 0.03 % nasal spray Place 2 sprays into both nostrils every 12 (twelve) hours.  . isosorbide mononitrate (IMDUR) 30 MG 24 hr tablet Take 1 tablet (30 mg total) by mouth daily.  [name] levothyroxine (SYNTHROID, LEVOTHROID) 75 MCG tablet Take 1 tablet (75 mcg total)  by mouth daily.  . metoprolol (LOPRESSOR) 50 MG tablet Take 1 tablet (50 mg total) by mouth 2 (two) times daily.  . montelukast (SINGULAIR) 10 MG tablet Take 1 tablet (10 mg total) by mouth at bedtime.  . Multiple Vitamins-Minerals (PRESERVISION AREDS PO) Take 2 tablets by mouth every morning.  [name] oxymetazoline (AFRIN) 0.05 % nasal spray Place 1 spray into both nostrils 2 (two) times daily as needed for congestion. Reported on 12/14/2015  . Probiotic Product (ALIGN) 4 MG CAPS Take 1 capsule by mouth daily.  . ranitidine (ZANTAC) 150 MG capsule Take 150 mg by mouth 2 (two) times daily.  [name] triamcinolone (NASACORT ALLERGY 24HR) 55 MCG/ACT AERO nasal inhaler Place 2 sprays into the nose daily.  [name] triamcinolone ointment (KENALOG) 0.1 % Apply 1 application topically 2 (two) times daily as needed.   . valACYclovir (VALTREX) 500 MG tablet Take 500 mg by mouth as needed (break outs).   [name] azithromycin (ZITHROMAX Z-PAK) 250 MG tablet Take 2 tablets (500 mg) on  Day 1,  followed by 1 tablet (250 mg) once daily on Days 2 through 5.   No facility-administered encounter medications on file as of 10/07/2016.      Review of Systems  Constitutional:   No  weight loss, night sweats,  Fevers, chills, fatigue, or  lassitude.  HEENT:   No headaches,  Difficulty swallowing,  Tooth/dental problems, or  Sore throat,                No sneezing, itching, ear ache, + nasal congestion, post nasal drip,   CV:  No chest pain,  Orthopnea, PND, swelling in lower extremities, anasarca, dizziness, palpitations, syncope.   GI  No heartburn, indigestion, abdominal pain, nausea, vomiting, diarrhea, change in bowel habits, loss of appetite, bloody stools.   Resp:   No chest wall deformity  Skin: no rash or lesions.  GU: no dysuria, change in color of urine, no urgency or frequency.  No flank pain, no hematuria   MS:  No joint pain or swelling.  No decreased range of motion.  No back pain.    Physical Exam  BP 112/60  (BP Location: Right Arm, Cuff Size: Normal)   Pulse 62   Ht 5\' 8"  (1.727 m)   Wt 287 lb 6.4 oz (130.4 kg)   SpO2 93%   BMI 43.70 kg/m   GEN: A/Ox3; pleasant , NAD, elderly /obese    HEENT:  [place]/AT,  EACs-clear, TMs-wnl, NOSE-clear drainage , max tenderness , THROAT-clear, no lesions, no postnasal drip or exudate noted.   NECK:  Supple w/ fair ROM; no JVD; normal carotid impulses w/o bruits; no thyromegaly or nodules palpated; no lymphadenopathy.  RESP  Clear  P & A; w/o, wheezes/ rales/ or rhonchi. no accessory muscle use, no dullness to percussion  CARD:  RRR, no m/r/g, no peripheral edema, pulses intact, no cyanosis or clubbing.  GI:   Soft & nt; nml bowel sounds; no organomegaly or masses detected.   Musco: Warm bil, no deformities or joint swelling noted.   Neuro: alert, no focal deficits noted.    Skin: Warm, no lesions or rashes  Psych:  No change in mood or affect. No depression or anxiety.  No memory loss.  Lab Results:  CBC    Component Value Date/Time   WBC 9.7 09/25/2016 0828   RBC 5.07 09/25/2016 0828   HGB 14.0 09/25/2016 0828   HCT 43.0 09/25/2016 0828   PLT 230.0 09/25/2016 0828   MCV 84.9 09/25/2016 0828   MCH 27.9 08/14/2016 1311   MCHC 32.6 09/25/2016 0828   RDW 15.9 (H) 09/25/2016 0828   LYMPHSABS 1.8 09/25/2016 0828   MONOABS 0.9 09/25/2016 0828   EOSABS 0.2 09/25/2016 0828   BASOSABS 0.1 09/25/2016 0828    BMET    Component Value Date/Time   NA 139 09/25/2016 0828   NA 140 09/04/2016 1452   K 4.3 09/25/2016 0828   CL 103 09/25/2016 0828   CO2 29 09/25/2016 0828   GLUCOSE 98 09/25/2016 0828   GLUCOSE 87 06/30/2006 1132   BUN 14 09/25/2016 0828   BUN 11 09/04/2016 1452   CREATININE 0.81 09/25/2016 0828   CREATININE 0.74 08/14/2016 1311   CALCIUM 8.5 09/25/2016 0828   GFRNONAA 85 09/04/2016 1452   GFRAA 99 09/04/2016 1452    BNP No results found for: BNP  ProBNP No results found for: PROBNP  Imaging: No results  found.   Assessment & Plan:   ACUTE BRONCHITIS Flare with sinusitis   Plan  Patient Instructions  Zpack take as directed.  Mucinex DM Twice daily  As needed  Cough/congestion  Saline nasal rinses As needed   Stop Flovent.  Begin Symbicort 80 2 puffs Twice daily  , rinse after use.  Follow up [name] as planned and As needed      Allergic rhinitis Flare , add saline nasal rinses .       Rexene Edison, NP 10/07/2016

## 2016-10-07 NOTE — Patient Instructions (Addendum)
Zpack take as directed.  Mucinex DM Twice daily  As needed  Cough/congestion  Saline nasal rinses As needed   Stop Flovent.  Begin Symbicort 80 2 puffs Twice daily  , rinse after use.  Follow up [name] as planned and As needed

## 2016-10-07 NOTE — Addendum Note (Signed)
Addended by: Parke Poisson E on: 10/07/2016 05:32 PM   Modules accepted: Orders

## 2016-10-07 NOTE — Assessment & Plan Note (Signed)
Flare , add saline nasal rinses .

## 2016-10-07 NOTE — Assessment & Plan Note (Signed)
Flare with sinusitis   Plan  Patient Instructions  Zpack take as directed.  Mucinex DM Twice daily  As needed  Cough/congestion  Saline nasal rinses As needed   Stop Flovent.  Begin Symbicort 80 2 puffs Twice daily  , rinse after use.  Follow up [name] as planned and As needed

## 2016-10-10 NOTE — Progress Notes (Signed)
Reviewed, agree 

## 2016-10-17 ENCOUNTER — Telehealth: Payer: Self-pay | Admitting: Pulmonary Disease

## 2016-10-17 NOTE — Telephone Encounter (Signed)
Called and spoke with pt and he is aware of BQ recs.  He stated that he will try this and he has an appt in march with BQ.  Nothing further is needed.

## 2016-10-17 NOTE — Telephone Encounter (Signed)
Called and spoke with pt and he stated that he was seen by TP about 2 wks ago and was given zpak. He stated that he did take this and did feel better.  He is now 6 days off zpak and he stated that he is back to square one.  He is now having the head congestion and it is all clear. He stated that this gets worse in the evenings and in the mornings when he gets up and takes his allergy meds, he seems to be ok until he bends to pick something up or it gets later in the day.  Pt is requesting for recs from BQ.  Please advise. Thanks  Allergies  Allergen Reactions  . Amlodipine     Swelling in ankles  . Peanut-Containing Drug Products Anaphylaxis  . Lisinopril     cough  . Prednisone     Constipation & "he could not sleep" **Can tolerate in certain situations**  . Sulfonamide Derivatives     REACTION: anaphylaxis

## 2016-10-17 NOTE — Telephone Encounter (Signed)
Patient returned call, CB is [phone]

## 2016-10-17 NOTE — Telephone Encounter (Signed)
It sounds like it is primarily a sinus problem. We would recommend:  Use Neil Med rinses with distilled water at least twice per day using the instructions on the package. 1/2 hour after using the [facility] Med rinse, use Nasacort two puffs in each nostril once per day.  Remember that the Nasacort can take 1-2 weeks to work after regular use. Use generic zyrtec (cetirizine) every day.  If this doesn't help, then stop taking it and use chlorpheniramine-phenylephrine combination tablets.  OV if no improvement

## 2016-10-17 NOTE — Telephone Encounter (Signed)
Attempted to contact pt. No answer, no option to leave a message. Will try back.  

## 2016-11-11 DIAGNOSIS — L603 Nail dystrophy: Secondary | ICD-10-CM | POA: Diagnosis not present

## 2016-11-11 DIAGNOSIS — L570 Actinic keratosis: Secondary | ICD-10-CM | POA: Diagnosis not present

## 2016-11-13 ENCOUNTER — Other Ambulatory Visit: Payer: Self-pay | Admitting: Family Medicine

## 2016-11-20 ENCOUNTER — Encounter: Payer: Self-pay | Admitting: Cardiovascular Disease

## 2016-11-20 ENCOUNTER — Ambulatory Visit (INDEPENDENT_AMBULATORY_CARE_PROVIDER_SITE_OTHER): Payer: Medicare Other | Admitting: Cardiovascular Disease

## 2016-11-20 VITALS — BP 134/70 | HR 69 | Ht 68.0 in | Wt 287.0 lb

## 2016-11-20 DIAGNOSIS — E7800 Pure hypercholesterolemia, unspecified: Secondary | ICD-10-CM

## 2016-11-20 DIAGNOSIS — I25118 Atherosclerotic heart disease of native coronary artery with other forms of angina pectoris: Secondary | ICD-10-CM

## 2016-11-20 DIAGNOSIS — I5032 Chronic diastolic (congestive) heart failure: Secondary | ICD-10-CM

## 2016-11-20 DIAGNOSIS — I350 Nonrheumatic aortic (valve) stenosis: Secondary | ICD-10-CM

## 2016-11-20 DIAGNOSIS — I10 Essential (primary) hypertension: Secondary | ICD-10-CM | POA: Diagnosis not present

## 2016-11-20 MED ORDER — FUROSEMIDE 40 MG PO TABS: 40.0000 mg | ORAL_TABLET | Freq: Every day | ORAL | 1 refills | Status: DC

## 2016-11-20 MED ORDER — POTASSIUM CHLORIDE ER 20 MEQ PO TBCR: 20.0000 meq | EXTENDED_RELEASE_TABLET | Freq: Every day | ORAL | 1 refills | Status: DC

## 2016-11-20 NOTE — Progress Notes (Signed)
Chief Complaint  Patient presents with  . Follow-up    History of Present Illness: 83 yo male with a h/o HTN, HLD, aortic stenosis, chronic diastolic CHF and CAD s/p 4V CABG March 2007 who is here today for cardiac follow up. Echo October 2017 normal EF, moderate Aortic valve stenosis with mean gradient 28 mmHg, peak gradient 41 mmHg. When I saw him in December 2017 he was having chest pain and dyspnea. Cardiac cath January 2018 with chronic occlusion of the left main and RCA with 4/4 patent bypass grafts (LIMA to LAD, SVG to Diagonal, SVG to OM, SVG to RCA). Mean gradient across the aortic valve of 17 mmHg, AVA 1.61 cm2. Filling pressures elevated and he was started on Lasix. He was started on Imdur.   He is here today for follow up. No chest pain but he is having dyspnea. Weight is up. Still has LE edema. He is still working as a Chief Strategy Officer.   Primary Care Physician: [name], MD   Past Medical History:  Diagnosis Date  . Allergic    (sees Dr. Velora Heckler)  . Allergy   . Asthma    sees [name]   . Benign prostatic hypertrophy    (sees Dr. Denman George  . CAD (coronary artery disease)    sees Dr. Haroldine Laws   . Carotid bruit    carotid u/s 10/10: 0.39% bilaterally  . ED (erectile dysfunction)   . GERD (gastroesophageal reflux disease)   . Gout   . HTN (hypertension)   . Hx of colonic polyps    (sees Dr. Henrene Pastor)  . Hyperlipidemia   . Myocardial infarction   . Obesity   . Osteoarthritis    especially in left knee, (sees Dr. Alphonzo Severance )  . Precancerous skin lesion    (sees Dr. Allyson Sabal)  . Prostate cancer ([place])   . S/P CABG (coronary artery bypass graft) 2007    Coronary artery bypass grafting x4 with left internal(Edward Servando Snare, MD)    Past Surgical History:  Procedure Laterality Date  . Bilateral subcutaneous mastectomies    . CARDIAC CATHETERIZATION  10/29/2005  . CARDIAC CATHETERIZATION N/A 08/27/2016   Procedure: Right/Left Heart Cath and Coronary/Graft Angiography;   Surgeon: Burnell Blanks, MD;  Location: [facility];  Service: Cardiovascular;  Laterality: N/A;  . CATARACT EXTRACTION, BILATERAL    . COLONOSCOPY  06/30/2008   no repeats needed   . CORONARY ARTERY BYPASS GRAFT  2007  . Hammer toe repairs bilaterally    . KNEE ARTHROPLASTY  07/30/2011   Procedure: COMPUTER ASSISTED TOTAL KNEE ARTHROPLASTY;  Surgeon: Meredith Pel;  Location: [facility];  Service: Orthopedics;  Laterality: Left;  left total knee arthroplasty  . Repair of right thumb injury    . REPLACEMENT TOTAL KNEE BILATERAL  2012    Current Outpatient Prescriptions  Medication Sig Dispense Refill  . aspirin 81 MG tablet Take 81 mg by mouth every evening.     [name] atorvastatin (LIPITOR) 20 MG tablet TAKE 1 TABLET ONCE DAILY. 90 tablet 1  . budesonide-formoterol (SYMBICORT) 80-4.5 MCG/ACT inhaler Inhale 2 puffs into the lungs 2 (two) times daily. 1 Inhaler 5  . cetirizine (ZYRTEC) 10 MG tablet Take 10 mg by mouth 2 (two) times daily.     . diclofenac (VOLTAREN) 75 MG EC tablet Take 75 mg by mouth 2 (two) times daily as needed (pain).    [name] ipratropium (ATROVENT) 0.03 % nasal spray Place 2 sprays into both nostrils every  12 (twelve) hours.    . isosorbide mononitrate (IMDUR) 30 MG 24 hr tablet Take 1 tablet (30 mg total) by mouth daily. 90 tablet 3  . levothyroxine (SYNTHROID, LEVOTHROID) 75 MCG tablet Take 1 tablet (75 mcg total) by mouth daily. 90 tablet 3  . metoprolol (LOPRESSOR) 50 MG tablet Take 1 tablet (50 mg total) by mouth 2 (two) times daily. 180 tablet 2  . montelukast (SINGULAIR) 10 MG tablet Take 1 tablet (10 mg total) by mouth at bedtime. 30 tablet 11  . Multiple Vitamins-Minerals (PRESERVISION AREDS PO) Take 2 tablets by mouth every morning.    [name] oxymetazoline (AFRIN) 0.05 % nasal spray Place 1 spray into both nostrils 2 (two) times daily as needed for congestion. Reported on 12/14/2015    . Probiotic Product (ALIGN) 4 MG CAPS Take 1 capsule by mouth daily.    .  ranitidine (ZANTAC) 150 MG capsule Take 150 mg by mouth 2 (two) times daily.    [name] triamcinolone (NASACORT ALLERGY 24HR) 55 MCG/ACT AERO nasal inhaler Place 2 sprays into the nose daily.    [name] triamcinolone ointment (KENALOG) 0.1 % Apply 1 application topically 2 (two) times daily as needed.     . valACYclovir (VALTREX) 500 MG tablet Take 500 mg by mouth as needed (break outs).     . furosemide (LASIX) 40 MG tablet Take 1 tablet (40 mg total) by mouth daily. 90 tablet 1  . potassium chloride 20 MEQ TBCR Take 20 mEq by mouth daily. 90 tablet 1   No current facility-administered medications for this visit.     Allergies  Allergen Reactions  . Amlodipine     Swelling in ankles  . Peanut-Containing Drug Products Anaphylaxis  . Lisinopril     cough  . Prednisone     Constipation & "he could not sleep" **Can tolerate in certain situations**  . Sulfonamide Derivatives     REACTION: anaphylaxis    Social History   Social History  . Marital status: Married    Spouse name: N/A  . Number of children: N/A  . Years of education: N/A   Occupational History  . general contractor     builds malls   Social History Main Topics  . Smoking status: Former Smoker    Packs/day: 3.00    Years: 6.00    Types: Cigarettes    Quit date: 08/26/1958  . Smokeless tobacco: Never Used  . Alcohol use 0.0 oz/week     Comment: 2-3 per week  . Drug use: No  . Sexual activity: Not on file   Other Topics Concern  . Not on file   Social History Narrative   FH of CAD, Male 1st degree relative less than age 22.    Family History  Problem Relation Age of Onset  . Heart attack Father 37  . Heart failure Mother 58  . Uterine cancer Mother     Review of Systems:  As stated in the HPI and otherwise negative.   BP 134/70   Pulse 69   Ht 5\' 8"  (1.727 m)   Wt 287 lb (130.2 kg)   BMI 43.64 kg/m   Physical Examination: General: Well developed, well nourished, NAD  HEENT: OP clear, mucus membranes  moist  SKIN: warm, dry. No rashes. Neuro: No focal deficits  Musculoskeletal: Muscle strength 5/5 all ext  Psychiatric: Mood and affect normal  Neck: No JVD, no carotid bruits, no thyromegaly, no lymphadenopathy.  Lungs:Clear bilaterally, no wheezes, rhonci, crackles Cardiovascular:  Regular rate and rhythm. Loud systolic murmur. No gallops or rubs. Abdomen:Soft. Bowel sounds present. Non-tender.  Extremities: 1 + bilateral  lower extremity edema. Pulses are 2 + in the bilateral DP/PT.  Echo 05/31/16: Left ventricle: The cavity size was normal. Wall thickness was   increased in a pattern of moderate LVH. Systolic function was   normal. The estimated ejection fraction was in the range of 55%   to 60%. Doppler parameters are consistent with a reversible   restrictive pattern, indicative of decreased left ventricular   diastolic compliance and/or increased left atrial pressure (grade   3 diastolic dysfunction). Doppler parameters are consistent with   high ventricular filling pressure. - Aortic valve: AV is thickened, calcified with restricted motion   Difficult to see well Peak and mean gradients through the valve   are 41 and 28 mm Hg respectively consistent with moderate AS - Mitral valve: Calcified annulus. Mildly thickened leaflets . - Left atrium: The atrium was mildly dilated.  Impressions:  - COmpared to report of echo from 2016 there is no significant   change.  Cardiac cath 08/27/16: Diagnostic Diagram          EKG:  EKG is ordered today. The ekg ordered today demonstrates Sinus, rate 69 bpm. 1st degree AV block. PVCs. Poor R wave progression, chronic. T wave flattening.   Recent Labs: 09/25/2016: ALT 13; BUN 14; Creatinine, Ser 0.81; Hemoglobin 14.0; Platelets 230.0; Potassium 4.3; Sodium 139; TSH 5.87   Lipid Panel    Component Value Date/Time   CHOL 109 09/25/2016 0828   TRIG 133.0 09/25/2016 0828   TRIG 143 06/30/2006 1132   HDL 29.80 (L) 09/25/2016 0828    CHOLHDL 4 09/25/2016 0828   VLDL 26.6 09/25/2016 0828   LDLCALC 53 09/25/2016 0828     Wt Readings from Last 3 Encounters:  11/20/16 287 lb (130.2 kg)  10/07/16 287 lb 6.4 oz (130.4 kg)  09/17/16 279 lb (126.6 kg)     Other studies Reviewed: Additional studies/ records that were reviewed today include:  Review of the above records demonstrates:    Assessment and Plan:   1. CAD s/p CABG with angina: He is not having chest pain on Imdur. He is still having some dyspnea. He is s/p 4V CABG in 2007 and cardiac cath January 2018 showed 4/4 patent bypass grafts. Will continue medical management for now with ASA, statin, beta blocker and Imdur. I suspect his dyspnea is related to his CHF.   2. Aortic valve stenosis: Moderate stenosis by echo October 2017 and confirmed moderate gradient by cath January 2018. Will follow for now.   3. HTN: BP controlled. No changes.   4. HLD: Continue statin. Lipids are well controlled.   5. Acute on Chronic diastolic CHF: He is up 8 lbs over last 2 months. He has been taking Lasix 20 mg daily post cath. I  Will increase his Lasix to 40 mg daily and add Kdur 20 meq daily. He will follow daily weights. BMET next week. If no response to this dose of Lasix, may need to increase next week.    Current medicines are reviewed at length with the patient today.  The patient does not have concerns regarding medicines.  The following changes have been made:  no change  Labs/ tests ordered today include:   Orders Placed This Encounter  Procedures  . Basic Metabolic Panel (BMET)  . EKG 12-Lead     Disposition:   FU with me in  6  months   Signed, [name], MD 11/20/2016 10:19 AM    [facility] [address], [place], [place]  [address] Phone: [phone]; Fax: [phone]

## 2016-11-20 NOTE — Patient Instructions (Signed)
Medication Instructions:  Your physician has recommended you make the following change in your medication:  Increase furosemide to 40 mg by mouth daily. Start potassium 20 meq by mouth daily.    Labwork: Your physician recommends that you return for lab work in: one week.  Scheduled for April 4,2018.  The lab is open from 7:30 AM to 4;45 PM.  You do not need to be fasting   Testing/Procedures: none  Follow-Up: Your physician recommends that you schedule a follow-up appointment in: about 3 weeks. Scheduled for April 13,2018 at 10:40   Any Other Special Instructions Will Be Listed Below (If Applicable).     If you need a refill on your cardiac medications before your next appointment, please call your pharmacy.

## 2016-11-27 ENCOUNTER — Other Ambulatory Visit: Payer: Medicare Other | Admitting: *Deleted

## 2016-11-27 DIAGNOSIS — H353132 Nonexudative age-related macular degeneration, bilateral, intermediate dry stage: Secondary | ICD-10-CM | POA: Diagnosis not present

## 2016-11-27 DIAGNOSIS — H43813 Vitreous degeneration, bilateral: Secondary | ICD-10-CM | POA: Diagnosis not present

## 2016-11-27 DIAGNOSIS — I5032 Chronic diastolic (congestive) heart failure: Secondary | ICD-10-CM | POA: Diagnosis not present

## 2016-11-27 DIAGNOSIS — I25118 Atherosclerotic heart disease of native coronary artery with other forms of angina pectoris: Secondary | ICD-10-CM | POA: Diagnosis not present

## 2016-11-28 LAB — BASIC METABOLIC PANEL
BUN/Creatinine Ratio: 23 (ref 10–24)
BUN: 21 mg/dL (ref 8–27)
CALCIUM: 8.7 mg/dL (ref 8.6–10.2)
CHLORIDE: 99 mmol/L (ref 96–106)
CO2: 24 mmol/L (ref 18–29)
Creatinine, Ser: 0.9 mg/dL (ref 0.76–1.27)
GFR calc Af Amer: 93 mL/min/{1.73_m2} (ref >59)
GFR, EST NON AFRICAN AMERICAN: 80 mL/min/{1.73_m2} (ref >59)
GLUCOSE: 105 mg/dL — AB (ref 65–99)
POTASSIUM: 4.3 mmol/L (ref 3.5–5.2)
SODIUM: 140 mmol/L (ref 134–144)

## 2016-12-02 ENCOUNTER — Encounter: Payer: Self-pay | Admitting: Pulmonary Disease

## 2016-12-02 ENCOUNTER — Ambulatory Visit (INDEPENDENT_AMBULATORY_CARE_PROVIDER_SITE_OTHER): Payer: Medicare Other | Admitting: Pulmonary Disease

## 2016-12-02 ENCOUNTER — Other Ambulatory Visit: Payer: Medicare Other

## 2016-12-02 VITALS — BP 114/68 | HR 59 | Ht 68.0 in | Wt 283.0 lb

## 2016-12-02 DIAGNOSIS — R0981 Nasal congestion: Secondary | ICD-10-CM

## 2016-12-02 DIAGNOSIS — J309 Allergic rhinitis, unspecified: Secondary | ICD-10-CM | POA: Diagnosis not present

## 2016-12-02 MED ORDER — DIAZEPAM 10 MG PO TABS: 10.0000 mg | ORAL_TABLET | Freq: Once | ORAL | 0 refills | Status: AC

## 2016-12-02 MED ORDER — AZELASTINE-FLUTICASONE 137-50 MCG/ACT NA SUSP: 2.0000 | Freq: Two times a day (BID) | NASAL | 6 refills | Status: DC

## 2016-12-02 NOTE — Assessment & Plan Note (Signed)
This problem is worse despite near maximal medical therapy. He says it's causing a lot of congestion, shortness of breath and wheezing. Based on his description of his symptoms, his normal lung exam today, I feel that this is solely due to sinus problems and not due to worsening asthma.  Plan: CT scan of the sinuses Add nasal antihistamine in addition to nasal steroid Check serum IgE Follow-up 4-6 weeks, if no improvement then consider either Xolair if IgE elevated or referral to allergy for immunotherapy

## 2016-12-02 NOTE — Addendum Note (Signed)
Addended by: Tyson Dense on: 12/02/2016 10:06 AM   Modules accepted: Orders

## 2016-12-02 NOTE — Assessment & Plan Note (Signed)
No recent changes in symptoms to suggest worsening control. Continue Symbicort

## 2016-12-02 NOTE — Progress Notes (Signed)
Subjective:    Patient ID: Justin [name], male    DOB: [birth year], 83 y.o.   MRN: [id number]  Synopsis: Former patient of Dr. Gwenette Greet who has irritable larynx syndrome as well as mild persistent asthma.' May 2017 CT scan of the sinuses was a poor study, there is no clear acute or chronic sinusitis noted. Previously treated with immunotherapy by Dr. Velora Heckler many years prior.   HPI  Chief Complaint  Patient presents with  . Follow-up    5 mo f/u. Pt states he feels about the same. Denies any SOB or chest pain. Still having congestion.    [name] says he needs some "help and direction".  He says that he is still having a lot of head congestion that is severe when he bends over.  He says that the drainage is clear.  He goes through two boxes of clean-ex twice a day.  He has a lot of wheezing from his throat.  He notes dyspnea and wheezing when he is walking at a gentle pace.    Cardiology recently adjusted his diuretics: lasix.  His weight has dropped by 7 pounds since doing this.  He is sleeping with his head elevated which helps.    He says that he is taking a "lot of medicine".   > he is taking a morning allergy medicine (cetirizine)  Past Medical History:  Diagnosis Date  . Allergic    (sees Dr. Velora Heckler)  . Allergy   . Asthma    sees [name]   . Benign prostatic hypertrophy    (sees Dr. Denman George  . CAD (coronary artery disease)    sees Dr. Haroldine Laws   . Carotid bruit    carotid u/s 10/10: 0.39% bilaterally  . ED (erectile dysfunction)   . GERD (gastroesophageal reflux disease)   . Gout   . HTN (hypertension)   . Hx of colonic polyps    (sees Dr. Henrene Pastor)  . Hyperlipidemia   . Myocardial infarction   . Obesity   . Osteoarthritis    especially in left knee, (sees Dr. Alphonzo Severance )  . Precancerous skin lesion    (sees Dr. Allyson Sabal)  . Prostate cancer ([place])   . S/P CABG (coronary artery bypass graft) 2007    Coronary artery bypass grafting x4 with left  internal(Edward Servando Snare, MD)     Review of Systems  Constitutional: Negative for chills, fatigue and fever.  HENT: Negative for postnasal drip, rhinorrhea and sinus pressure.   Respiratory: Positive for cough. Negative for shortness of breath and wheezing.   Cardiovascular: Negative for chest pain, palpitations and leg swelling.       Objective:   Physical Exam  Vitals:   12/02/16 0912  BP: 114/68  Pulse: (!) 59  SpO2: 96%  Weight: 283 lb (128.4 kg)  Height: 5\' 8"  (1.727 m)    RA  Gen: well appearing HENT: OP clear, TM's clear, neck supple PULM: CTA B, normal percussion CV: RRR, no mgr, trace edema GI: BS+, soft, nontender Derm: no cyanosis or rash Psyche: normal mood and affect  CBC    Component Value Date/Time   WBC 9.7 09/25/2016 0828   RBC 5.07 09/25/2016 0828   HGB 14.0 09/25/2016 0828   HCT 43.0 09/25/2016 0828   PLT 230.0 09/25/2016 0828   MCV 84.9 09/25/2016 0828   MCH 27.9 08/14/2016 1311   MCHC 32.6 09/25/2016 0828   RDW 15.9 (H) 09/25/2016 0828   LYMPHSABS 1.8 09/25/2016 1025  MONOABS 0.9 09/25/2016 0828   EOSABS 0.2 09/25/2016 0828   BASOSABS 0.1 09/25/2016 0828   12/2015 CT sinuses without significant disease      Assessment & Plan:  Allergic rhinitis This problem is worse despite near maximal medical therapy. He says it's causing a lot of congestion, shortness of breath and wheezing. Based on his description of his symptoms, his normal lung exam today, I feel that this is solely due to sinus problems and not due to worsening asthma.  Plan: CT scan of the sinuses Add nasal antihistamine in addition to nasal steroid Check serum IgE Follow-up 4-6 weeks, if no improvement then consider either Xolair if IgE elevated or referral to allergy for immunotherapy  Asthma, intermittent No recent changes in symptoms to suggest worsening control. Continue Symbicort    Current Outpatient Prescriptions:  .  aspirin 81 MG tablet, Take 81 mg by mouth  every evening. , Disp: , Rfl:  .  atorvastatin (LIPITOR) 20 MG tablet, TAKE 1 TABLET ONCE DAILY., Disp: 90 tablet, Rfl: 1 .  budesonide-formoterol (SYMBICORT) 80-4.5 MCG/ACT inhaler, Inhale 2 puffs into the lungs 2 (two) times daily., Disp: 1 Inhaler, Rfl: 5 .  cetirizine (ZYRTEC) 10 MG tablet, Take 10 mg by mouth 2 (two) times daily. , Disp: , Rfl:  .  diclofenac (VOLTAREN) 75 MG EC tablet, Take 75 mg by mouth 2 (two) times daily as needed (pain)., Disp: , Rfl:  .  furosemide (LASIX) 40 MG tablet, Take 1 tablet (40 mg total) by mouth daily., Disp: 90 tablet, Rfl: 1 .  ipratropium (ATROVENT) 0.03 % nasal spray, Place 2 sprays into both nostrils every 12 (twelve) hours., Disp: , Rfl:  .  isosorbide mononitrate (IMDUR) 30 MG 24 hr tablet, Take 1 tablet (30 mg total) by mouth daily., Disp: 90 tablet, Rfl: 3 .  levothyroxine (SYNTHROID, LEVOTHROID) 75 MCG tablet, Take 1 tablet (75 mcg total) by mouth daily., Disp: 90 tablet, Rfl: 3 .  metoprolol (LOPRESSOR) 50 MG tablet, Take 1 tablet (50 mg total) by mouth 2 (two) times daily., Disp: 180 tablet, Rfl: 2 .  montelukast (SINGULAIR) 10 MG tablet, Take 1 tablet (10 mg total) by mouth at bedtime., Disp: 30 tablet, Rfl: 11 .  Multiple Vitamins-Minerals (PRESERVISION AREDS PO), Take 2 tablets by mouth every morning., Disp: , Rfl:  .  oxymetazoline (AFRIN) 0.05 % nasal spray, Place 1 spray into both nostrils 2 (two) times daily as needed for congestion. Reported on 12/14/2015, Disp: , Rfl:  .  potassium chloride 20 MEQ TBCR, Take 20 mEq by mouth daily., Disp: 90 tablet, Rfl: 1 .  Probiotic Product (ALIGN) 4 MG CAPS, Take 1 capsule by mouth daily., Disp: , Rfl:  .  ranitidine (ZANTAC) 150 MG capsule, Take 150 mg by mouth 2 (two) times daily., Disp: , Rfl:  .  triamcinolone (NASACORT ALLERGY 24HR) 55 MCG/ACT AERO nasal inhaler, Place 2 sprays into the nose daily., Disp: , Rfl:  .  triamcinolone ointment (KENALOG) 0.1 %, Apply 1 application topically 2 (two) times  daily as needed. , Disp: , Rfl:  .  valACYclovir (VALTREX) 500 MG tablet, Take 500 mg by mouth as needed (break outs). , Disp: , Rfl:  .  Azelastine-Fluticasone (DYMISTA) 137-50 MCG/ACT SUSP, Place 2 puffs into the nose 2 (two) times daily., Disp: 1 Bottle, Rfl: 6

## 2016-12-02 NOTE — Patient Instructions (Signed)
Stopped taking Nasacort Start taking Dymista 2 puffs each nostril twice a Keep taking Symbicort We will call you with the results of the bloodwork We will arrange for a CT scan in a few weeks, if you find that your symptoms have improved with Dymista then you can cancel the CT scan We will have you follow-up with our nurse practitioner in 4-6 weeks

## 2016-12-03 LAB — IGE: IGE (IMMUNOGLOBULIN E), SERUM: 180 kU/L — AB (ref <115)

## 2016-12-06 ENCOUNTER — Ambulatory Visit (INDEPENDENT_AMBULATORY_CARE_PROVIDER_SITE_OTHER): Payer: Medicare Other | Admitting: Cardiovascular Disease

## 2016-12-06 ENCOUNTER — Encounter: Payer: Self-pay | Admitting: Cardiovascular Disease

## 2016-12-06 VITALS — BP 134/62 | HR 70 | Ht 68.0 in | Wt 283.6 lb

## 2016-12-06 DIAGNOSIS — I5033 Acute on chronic diastolic (congestive) heart failure: Secondary | ICD-10-CM

## 2016-12-06 MED ORDER — FUROSEMIDE 40 MG PO TABS: ORAL_TABLET | ORAL | 3 refills | Status: DC

## 2016-12-06 MED ORDER — POTASSIUM CHLORIDE CRYS ER 20 MEQ PO TBCR: 20.0000 meq | EXTENDED_RELEASE_TABLET | Freq: Every day | ORAL | 3 refills | Status: DC

## 2016-12-06 NOTE — Patient Instructions (Signed)
Medication Instructions:  Your physician has recommended you make the following change in your medication:  Change furosemide to 40 mg by mouth daily alternating with 40 mg twice daily. Take one extra potassium tablet on days you take extra furosemide   Labwork: none  Testing/Procedures: none  Follow-Up: Your physician recommends that you schedule a follow-up appointment in: 3 months.     Any Other Special Instructions Will Be Listed Below (If Applicable).     If you need a refill on your cardiac medications before your next appointment, please call your pharmacy.

## 2016-12-06 NOTE — Progress Notes (Signed)
Chief Complaint  Patient presents with  . Follow-up    History of Present Illness: 83 yo male with history of HTN, HLD, aortic stenosis, chronic diastolic CHF and CAD s/p 4V CABG March 2007 who is here today for cardiac follow up. Echo October 2017 normal EF, moderate Aortic valve stenosis with mean gradient 28 mmHg, peak gradient 41 mmHg. When I saw him in December 2017 he was having chest pain and dyspnea. Cardiac cath January 2018 with chronic occlusion of the left main and RCA with 4/4 patent bypass grafts (LIMA to LAD, SVG to Diagonal, SVG to OM, SVG to RCA). Mean gradient across the aortic valve of 17 mmHg, AVA 1.61 cm2. Filling pressures elevated and he was started on Lasix. He was started on Imdur. I saw him 11/20/16 and his weight was up and he was having more dyspnea. His Lasix was increased.   He is here today for follow up. No chest pain. His breathing seems to be slightly better on higher dose Lasix. Still has LE edema, slightly better. Weight is unchanged.     Primary Care Physician: [name], MD   Past Medical History:  Diagnosis Date  . Allergic    (sees Dr. Velora Heckler)  . Allergy   . Asthma    sees [name]   . Benign prostatic hypertrophy    (sees Dr. Denman George  . CAD (coronary artery disease)    sees Dr. Haroldine Laws   . Carotid bruit    carotid u/s 10/10: 0.39% bilaterally  . ED (erectile dysfunction)   . GERD (gastroesophageal reflux disease)   . Gout   . HTN (hypertension)   . Hx of colonic polyps    (sees Dr. Henrene Pastor)  . Hyperlipidemia   . Myocardial infarction   . Obesity   . Osteoarthritis    especially in left knee, (sees Dr. Alphonzo Severance )  . Precancerous skin lesion    (sees Dr. Allyson Sabal)  . Prostate cancer ([place])   . S/P CABG (coronary artery bypass graft) 2007    Coronary artery bypass grafting x4 with left internal(Edward Servando Snare, MD)    Past Surgical History:  Procedure Laterality Date  . Bilateral subcutaneous mastectomies    . CARDIAC  CATHETERIZATION  10/29/2005  . CARDIAC CATHETERIZATION N/A 08/27/2016   Procedure: Right/Left Heart Cath and Coronary/Graft Angiography;  Surgeon: Burnell Blanks, MD;  Location: [facility];  Service: Cardiovascular;  Laterality: N/A;  . CATARACT EXTRACTION, BILATERAL    . COLONOSCOPY  06/30/2008   no repeats needed   . CORONARY ARTERY BYPASS GRAFT  2007  . Hammer toe repairs bilaterally    . KNEE ARTHROPLASTY  07/30/2011   Procedure: COMPUTER ASSISTED TOTAL KNEE ARTHROPLASTY;  Surgeon: Meredith Pel;  Location: [facility];  Service: Orthopedics;  Laterality: Left;  left total knee arthroplasty  . Repair of right thumb injury    . REPLACEMENT TOTAL KNEE BILATERAL  2012    Current Outpatient Prescriptions  Medication Sig Dispense Refill  . aspirin 81 MG tablet Take 81 mg by mouth every evening.     [name] atorvastatin (LIPITOR) 20 MG tablet Take 20 mg by mouth daily.    . Azelastine-Fluticasone (DYMISTA) 137-50 MCG/ACT SUSP Place 2 puffs into the nose 2 (two) times daily. 1 Bottle 6  . budesonide-formoterol (SYMBICORT) 80-4.5 MCG/ACT inhaler Inhale 2 puffs into the lungs 2 (two) times daily. 1 Inhaler 5  . cetirizine (ZYRTEC) 10 MG tablet Take 10 mg by mouth 2 (two)  times daily.     . diclofenac (VOLTAREN) 75 MG EC tablet Take 75 mg by mouth 2 (two) times daily as needed (pain).    . furosemide (LASIX) 40 MG tablet Take 1 tablet (40 mg total) by mouth daily. 90 tablet 1  . ipratropium (ATROVENT) 0.03 % nasal spray Place 2 sprays into both nostrils every 12 (twelve) hours.    . isosorbide mononitrate (IMDUR) 30 MG 24 hr tablet Take 1 tablet (30 mg total) by mouth daily. 90 tablet 3  . levothyroxine (SYNTHROID, LEVOTHROID) 75 MCG tablet Take 1 tablet (75 mcg total) by mouth daily. 90 tablet 3  . metoprolol (LOPRESSOR) 50 MG tablet Take 1 tablet (50 mg total) by mouth 2 (two) times daily. 180 tablet 2  . montelukast (SINGULAIR) 10 MG tablet Take 1 tablet (10 mg total) by mouth at bedtime.  30 tablet 11  . Multiple Vitamins-Minerals (PRESERVISION AREDS PO) Take 2 tablets by mouth every morning.    [name] oxymetazoline (AFRIN) 0.05 % nasal spray Place 1 spray into both nostrils 2 (two) times daily as needed for congestion. Reported on 12/14/2015    . potassium chloride 20 MEQ TBCR Take 20 mEq by mouth daily. 90 tablet 1  . Probiotic Product (ALIGN) 4 MG CAPS Take 1 capsule by mouth daily.    . ranitidine (ZANTAC) 150 MG capsule Take 150 mg by mouth 2 (two) times daily.    [name] triamcinolone (NASACORT ALLERGY 24HR) 55 MCG/ACT AERO nasal inhaler Place 2 sprays into the nose daily.    [name] triamcinolone ointment (KENALOG) 0.1 % Apply 1 application topically 2 (two) times daily as needed (Use as directed).     . valACYclovir (VALTREX) 500 MG tablet Take 500 mg by mouth as needed (Take as directed for break outs).      No current facility-administered medications for this visit.     Allergies  Allergen Reactions  . Amlodipine     Swelling in ankles  . Peanut-Containing Drug Products Anaphylaxis  . Lisinopril     cough  . Prednisone     Constipation & "he could not sleep" **Can tolerate in certain situations**  . Sulfonamide Derivatives     REACTION: anaphylaxis    Social History   Social History  . Marital status: Married    Spouse name: N/A  . Number of children: N/A  . Years of education: N/A   Occupational History  . general contractor     builds malls   Social History Main Topics  . Smoking status: Former Smoker    Packs/day: 3.00    Years: 6.00    Types: Cigarettes    Quit date: 08/26/1958  . Smokeless tobacco: Never Used  . Alcohol use 0.0 oz/week     Comment: 2-3 per week  . Drug use: No  . Sexual activity: Not on file   Other Topics Concern  . Not on file   Social History Narrative   FH of CAD, Male 1st degree relative less than age 53.    Family History  Problem Relation Age of Onset  . Heart attack Father 2  . Heart failure Mother 107  . Uterine  cancer Mother     Review of Systems:  As stated in the HPI and otherwise negative.   BP 134/62   Pulse 70   Ht 5\' 8"  (1.727 m)   Wt 283 lb 9.6 oz (128.6 kg)   SpO2 94%   BMI 43.12 kg/m   Physical Examination:  General: Well developed, well nourished, NAD  HEENT: OP clear, mucus membranes moist  SKIN: warm, dry. No rashes. Neuro: No focal deficits  Musculoskeletal: Muscle strength 5/5 all ext  Psychiatric: Mood and affect normal  Neck: No JVD, no carotid bruits, no thyromegaly, no lymphadenopathy.  Lungs:Clear bilaterally, no wheezes, rhonci, crackles Cardiovascular: Regular rate and rhythm. Loud systolic murmur. No gallops or rubs. Abdomen:Soft. Bowel sounds present. Non-tender.  Extremities: 1 + bilateral  lower extremity edema. Pulses are 2 + in the bilateral DP/PT.  Echo 05/31/16: Left ventricle: The cavity size was normal. Wall thickness was   increased in a pattern of moderate LVH. Systolic function was   normal. The estimated ejection fraction was in the range of 55%   to 60%. Doppler parameters are consistent with a reversible   restrictive pattern, indicative of decreased left ventricular   diastolic compliance and/or increased left atrial pressure (grade   3 diastolic dysfunction). Doppler parameters are consistent with   high ventricular filling pressure. - Aortic valve: AV is thickened, calcified with restricted motion   Difficult to see well Peak and mean gradients through the valve   are 41 and 28 mm Hg respectively consistent with moderate AS - Mitral valve: Calcified annulus. Mildly thickened leaflets . - Left atrium: The atrium was mildly dilated.  Impressions:  - COmpared to report of echo from 2016 there is no significant   change.  Cardiac cath 08/27/16: Diagnostic Diagram          EKG:  EKG is not ordered today The ekg ordered today demonstrates   Recent Labs: 09/25/2016: ALT 13; Hemoglobin 14.0; Platelets 230.0; TSH 5.87 11/27/2016: BUN 21;  Creatinine, Ser 0.90; Potassium 4.3; Sodium 140   Lipid Panel    Component Value Date/Time   CHOL 109 09/25/2016 0828   TRIG 133.0 09/25/2016 0828   TRIG 143 06/30/2006 1132   HDL 29.80 (L) 09/25/2016 0828   CHOLHDL 4 09/25/2016 0828   VLDL 26.6 09/25/2016 0828   LDLCALC 53 09/25/2016 0828     Wt Readings from Last 3 Encounters:  12/06/16 283 lb 9.6 oz (128.6 kg)  12/02/16 283 lb (128.4 kg)  11/20/16 287 lb (130.2 kg)     Other studies Reviewed: Additional studies/ records that were reviewed today include:  Review of the above records demonstrates:    Assessment and Plan:   1. CAD s/p CABG with angina: He is not having chest pain on Imdur. He is still having some dyspnea but this is slightly better with diuresis. He is s/p 4V CABG in 2007 and cardiac cath January 2018 showed 4/4 patent bypass grafts. Will continue medical management for now with ASA, statin, beta blocker and Imdur.    2. Aortic valve stenosis: Moderate stenosis by echo October 2017 and confirmed moderate gradient by cath January 2018. Will follow for now.   3. HTN: BP controlled. No changes.   4. HLD: Continue statin. Lipids are well controlled.   5. Acute on Chronic diastolic CHF: He thinks his weight is down a few lbs and he is feeling better on the higher dose of Lasix. He is still volume overloaded. Will increase Lasix to 40 mg daily alternating with 40 mg BID. He will call with his weight in several weeks.    Current medicines are reviewed at length with the patient today.  The patient does not have concerns regarding medicines.  The following changes have been made:  no change  Labs/ tests ordered today include:  No orders of the defined types were placed in this encounter.    Disposition:   FU with me in 6  months   Signed, [name], MD 12/06/2016 10:42 AM    [facility] [place], [place], [place]  [address] Phone: [phone]; Fax: [phone]

## 2016-12-23 ENCOUNTER — Encounter: Payer: Self-pay | Admitting: Family Medicine

## 2016-12-23 ENCOUNTER — Ambulatory Visit (INDEPENDENT_AMBULATORY_CARE_PROVIDER_SITE_OTHER): Payer: Medicare Other | Admitting: Family Medicine

## 2016-12-23 VITALS — BP 126/73 | HR 77 | Temp 97.7°F | Ht 68.0 in | Wt 285.0 lb

## 2016-12-23 DIAGNOSIS — I10 Essential (primary) hypertension: Secondary | ICD-10-CM

## 2016-12-23 DIAGNOSIS — K589 Irritable bowel syndrome without diarrhea: Secondary | ICD-10-CM

## 2016-12-23 DIAGNOSIS — E039 Hypothyroidism, unspecified: Secondary | ICD-10-CM | POA: Insufficient documentation

## 2016-12-23 DIAGNOSIS — I252 Old myocardial infarction: Secondary | ICD-10-CM | POA: Diagnosis not present

## 2016-12-23 DIAGNOSIS — R600 Localized edema: Secondary | ICD-10-CM

## 2016-12-23 LAB — BASIC METABOLIC PANEL
BUN: 20 mg/dL (ref 6–23)
CALCIUM: 8.6 mg/dL (ref 8.4–10.5)
CHLORIDE: 103 meq/L (ref 96–112)
CO2: 29 mEq/L (ref 19–32)
CREATININE: 0.72 mg/dL (ref 0.40–1.50)
GFR: 111.5 mL/min (ref >60.00)
Glucose, Bld: 126 mg/dL — ABNORMAL HIGH (ref 70–99)
Potassium: 4.3 mEq/L (ref 3.5–5.1)
Sodium: 139 mEq/L (ref 135–145)

## 2016-12-23 LAB — TSH: TSH: 2.32 u[IU]/mL (ref 0.35–4.50)

## 2016-12-23 LAB — T3, FREE: T3 FREE: 3.1 pg/mL (ref 2.3–4.2)

## 2016-12-23 LAB — T4, FREE: Free T4: 0.8 ng/dL (ref 0.60–1.60)

## 2016-12-23 NOTE — Progress Notes (Signed)
Pre visit review using our clinic review tool, if applicable. No additional management support is needed unless otherwise documented below in the visit note. 

## 2016-12-23 NOTE — Progress Notes (Signed)
   Subjective:    Patient ID: Justin [name], male    DOB: 08-19-1934, 83 y.o.   MRN: [id number]  HPI Here to follow up on several issues. He feels well in general but has questions. He saw Dr. Angelena Form on 12-06-16 to talk about his cardiac issues and leg edema. Since then he has been alternating taking Lasix 40 mg one day and then taking this BID the next day. He feels about the same and the swelling in his legs has not changed. No SOB. His weight sat home are stable in the range of 277-280. Also he has been taking Synthroid for 90 days and we need to check a level. He has been using Dymista sprays which have helped his nasal congestion, but he has had some soreness of the tongue off and on. He has been ising Claritin but still has sneezing episodes. He has been taking Align but still has a lot of gas which causes he to belch. No heartburn. BMs are normal. Gas X does not help.   Review of Systems  Constitutional: Negative.   HENT: Positive for sneezing. Negative for postnasal drip, sinus pain, sinus pressure and sore throat.   Respiratory: Negative.   Cardiovascular: Positive for leg swelling. Negative for chest pain and palpitations.  Gastrointestinal: Negative.   Neurological: Negative.        Objective:   Physical Exam  Constitutional: He is oriented to person, place, and time. He appears well-developed and well-nourished.  HENT:  Right Ear: External ear normal.  Left Ear: External ear normal.  Nose: Nose normal.  Mouth/Throat: Oropharynx is clear and moist.  Eyes: Conjunctivae are normal.  Neck: No thyromegaly present.  Cardiovascular: Normal rate, regular rhythm, normal heart sounds and intact distal pulses.   Pulmonary/Chest: Effort normal and breath sounds normal.  Abdominal: Soft. Bowel sounds are normal. He exhibits no distension and no mass. There is no tenderness. There is no rebound and no guarding.  Musculoskeletal:  1+ edema in both lower legs.  Lymphadenopathy:    He  has no cervical adenopathy.  Neurological: He is alert and oriented to person, place, and time.          Assessment & Plan:  For the hypothyroidism we will check a full thyroid panel today. For the leg edema we will increase the Lasix to 40 mg every day. His potassium 3 weeks ago was normal and we will continue to watch this. For the bowel gas he will try Floraster instead of Align. For the sneezing he will try Xyzal instead of Claritin.  [name], MD

## 2016-12-23 NOTE — Patient Instructions (Signed)
WE NOW OFFER   [facility] FAST TRACK!!!  SAME DAY Appointments for ACUTE CARE  Such as: Sprains, Injuries, cuts, abrasions, rashes, muscle pain, joint pain, back pain Colds, flu, sore throats, headache, allergies, cough, fever  Ear pain, sinus and eye infections Abdominal pain, nausea, vomiting, diarrhea, upset stomach Animal/insect bites  3 Easy Ways to Schedule: Walk-In Scheduling Call in scheduling Mychart Sign-up: [email or web address]         

## 2016-12-31 ENCOUNTER — Inpatient Hospital Stay: Admission: RE | Admit: 2016-12-31 | Payer: Medicare Other | Source: Ambulatory Visit

## 2017-01-02 ENCOUNTER — Telehealth: Payer: Self-pay | Admitting: *Deleted

## 2017-01-02 NOTE — Telephone Encounter (Signed)
I spoke with pt regarding weight chart he dropped off in office earlier this week.  Pt reports furosemide was increased to 40 mg by mouth twice daily when he saw Dr. Sarajane Jews on April 30,2018.  Last recorded weight was 283.6 lbs on 5/7.  Lowest weight was 276 lbs on 4/27 and 5/3.   Pt reports swelling is about the same.Reports he did have a couple days in late April where he was very swollen. He is eating the same and watching salt.  Does report he is moving and has been eating out more often.  Shortness of breath seems to be worsening. He feels he has had to slow down significantly and pace himself when doing activities.  No chest pain. Seeing pulmonary tomorrow. I told pt I would review with Dr. Angelena Form when he was back in the office tomorrow.

## 2017-01-03 ENCOUNTER — Ambulatory Visit (INDEPENDENT_AMBULATORY_CARE_PROVIDER_SITE_OTHER): Payer: Medicare Other | Admitting: Adult Health

## 2017-01-03 ENCOUNTER — Ambulatory Visit (INDEPENDENT_AMBULATORY_CARE_PROVIDER_SITE_OTHER)
Admission: RE | Admit: 2017-01-03 | Discharge: 2017-01-03 | Disposition: A | Discharge status: Home / Self Care | Payer: Medicare Other | Source: Ambulatory Visit | Attending: Adult Health | Admitting: Adult Health

## 2017-01-03 ENCOUNTER — Encounter: Payer: Self-pay | Admitting: Adult Health

## 2017-01-03 VITALS — BP 126/66 | HR 65 | Ht 67.0 in | Wt 288.4 lb

## 2017-01-03 DIAGNOSIS — J4520 Mild intermittent asthma, uncomplicated: Secondary | ICD-10-CM | POA: Diagnosis not present

## 2017-01-03 DIAGNOSIS — J90 Pleural effusion, not elsewhere classified: Secondary | ICD-10-CM | POA: Diagnosis not present

## 2017-01-03 DIAGNOSIS — J309 Allergic rhinitis, unspecified: Secondary | ICD-10-CM

## 2017-01-03 NOTE — Progress Notes (Signed)
@Patient  ID: Justin [name], male    DOB: 11-08-1933, 83 y.o.   MRN: [id number]  Chief Complaint  Patient presents with  . Follow-up    Cough /AR     Referring provider: Laurey Morale, MD  HPI: 83 yo male followed for chronic cough , mild persistent asthnma and irritable larynx syndrome.  Has CAD s/p CABG 2007, Aortic stenosis ,   TEST /Events  Previously treated with immunotherapy by Dr. Velora Heckler many years prior.  CT sinus 12/2015 -poor exam, no sign sinus dz noted  ENT in past neg eval  Echo 05/2016 showed moderate Aortic Valve stenosis  Follow with Dr. Fredderick Phenix for allergy  IgE ~200  Spiro 2016 mild airflow obstruction  CXR 12/2015 -clear lungs  IgE 11/2016 180   01/03/2017 Follow up : Asthma/AR /Chronic cough  Patient returns for a one-month follow-up. Patient was seen last visit with increased allergy symptoms of nasal congestion and drainage along with wheezing and cough. Patient was felt to have a flare of his allergic rhinitis. and was changed from [place] to [place] .  Since last visit. Patient is feeling better with less drainage Throat drainage is not totally gone.  He misunderstood and stopped Symbicort as well. We discussed restarting this.  Currently moving , he notices that he get winded easily carrying boxes and pushing on stuff.  He has to rest frequently. Says no chest pain , no palpitations, increased edema , or fever.  Admits he is sedentary normally.   Going on round the world trip next year.     Allergies  Allergen Reactions  . Amlodipine     Swelling in ankles  . Peanut-Containing Drug Products Anaphylaxis  . Lisinopril     cough  . Prednisone     Constipation & "he could not sleep" **Can tolerate in certain situations**  . Sulfonamide Derivatives     REACTION: anaphylaxis    Immunization History  Administered Date(s) Administered  . Hep A / Hep B 04/06/2012, 10/07/2012  . Hepatitis B 05/07/2012  . Influenza Split 06/28/2013  .  Influenza Whole 06/05/2007, 05/13/2008, 06/06/2009, 05/30/2010  . Influenza,inj,Quad PF,36+ Mos 05/20/2016  . Influenza-Unspecified 05/25/2014, 07/08/2015  . Pneumococcal Conjugate-13 09/18/2015  . Pneumococcal Polysaccharide-23 05/13/2008, 05/25/2014  . Td 05/30/2010  . Zoster 10/21/2007    Past Medical History:  Diagnosis Date  . Allergic    (sees Dr. Velora Heckler)  . Allergy   . Asthma    sees [name]   . Benign prostatic hypertrophy    (sees Dr. Denman George  . CAD (coronary artery disease)    sees Dr. Haroldine Laws   . Carotid bruit    carotid u/s 10/10: 0.39% bilaterally  . ED (erectile dysfunction)   . GERD (gastroesophageal reflux disease)   . Gout   . HTN (hypertension)   . Hx of colonic polyps    (sees Dr. Henrene Pastor)  . Hyperlipidemia   . Myocardial infarction ([place])   . Obesity   . Osteoarthritis    especially in left knee, (sees Dr. Alphonzo Severance )  . Precancerous skin lesion    (sees Dr. Allyson Sabal)  . Prostate cancer ([place])   . S/P CABG (coronary artery bypass graft) 2007    Coronary artery bypass grafting x4 with left internal(Edward Servando Snare, MD)    Tobacco History: History  Smoking Status  . Former Smoker  . Packs/day: 3.00  . Years: 6.00  . Types: Cigarettes  . Quit date: 08/26/1958  Smokeless Tobacco  .  Never Used   Counseling given: Not Answered   Outpatient Encounter Prescriptions as of 01/03/2017  Medication Sig  . aspirin 81 MG tablet Take 81 mg by mouth every evening.   [name] atorvastatin (LIPITOR) 20 MG tablet Take 20 mg by mouth daily.  . Azelastine-Fluticasone (DYMISTA) 137-50 MCG/ACT SUSP Place 2 puffs into the nose 2 (two) times daily.  . budesonide-formoterol (SYMBICORT) 80-4.5 MCG/ACT inhaler Inhale 2 puffs into the lungs 2 (two) times daily.  . diclofenac (VOLTAREN) 75 MG EC tablet Take 75 mg by mouth 2 (two) times daily as needed (pain).  . furosemide (LASIX) 40 MG tablet Take one tablet by mouth daily alternating with one tablet twice daily  .  ipratropium (ATROVENT) 0.03 % nasal spray Place 2 sprays into both nostrils every 12 (twelve) hours.  . isosorbide mononitrate (IMDUR) 30 MG 24 hr tablet Take 1 tablet (30 mg total) by mouth daily.  [name] levothyroxine (SYNTHROID, LEVOTHROID) 75 MCG tablet Take 1 tablet (75 mcg total) by mouth daily.  [name] loratadine (CLARITIN) 10 MG tablet Take 10 mg by mouth daily.  . metoprolol (LOPRESSOR) 50 MG tablet Take 1 tablet (50 mg total) by mouth 2 (two) times daily.  . montelukast (SINGULAIR) 10 MG tablet Take 1 tablet (10 mg total) by mouth at bedtime.  . Multiple Vitamins-Minerals (PRESERVISION AREDS PO) Take 2 tablets by mouth every morning.  . potassium chloride SA (K-DUR,KLOR-CON) 20 MEQ tablet Take 1 tablet (20 mEq total) by mouth daily. Take one extra tablet on days you take extra furosemide  . Probiotic Product (ALIGN) 4 MG CAPS Take 1 capsule by mouth daily.  . ranitidine (ZANTAC) 150 MG capsule Take 150 mg by mouth 2 (two) times daily.  [name] triamcinolone (NASACORT ALLERGY 24HR) 55 MCG/ACT AERO nasal inhaler Place 2 sprays into the nose daily.  . valACYclovir (VALTREX) 500 MG tablet Take 500 mg by mouth as needed (Take as directed for break outs).    No facility-administered encounter medications on file as of 01/03/2017.      Review of Systems  Constitutional:   No  weight loss, night sweats,  Fevers, chills,  +fatigue, or  lassitude.  HEENT:   No headaches,  Difficulty swallowing,  Tooth/dental problems, or  Sore throat,                No sneezing, itching, ear ache,  +nasal congestion, post nasal drip,   CV:  No chest pain,  Orthopnea, PND, swelling in lower extremities, anasarca, dizziness, palpitations, syncope.   GI  No heartburn, indigestion, abdominal pain, nausea, vomiting, diarrhea, change in bowel habits, loss of appetite, bloody stools.   Resp:  .  No excess mucus, no productive cough,  No non-productive cough,  No coughing up of blood.  No change in color of mucus.  No wheezing.   No chest wall deformity  Skin: no rash or lesions.  GU: no dysuria, change in color of urine, no urgency or frequency.  No flank pain, no hematuria   MS:  No joint pain or swelling.  No decreased range of motion.  No back pain.    Physical Exam  BP 126/66 (BP Location: Left Arm, Cuff Size: Normal)   Pulse 65   Ht 5\' 7"  (1.702 m)   Wt 288 lb 6.4 oz (130.8 kg)   SpO2 95%   BMI 45.17 kg/m   GEN: A/Ox3; pleasant , NAD, obese    HEENT:  [place]/AT,  EACs-clear, TMs-wnl, NOSE-clear drainage , THROAT-clear, no  lesions, no postnasal drip or exudate noted.   NECK:  Supple w/ fair ROM; no JVD; normal carotid impulses w/o bruits; no thyromegaly or nodules palpated; no lymphadenopathy.    RESP  Clear  P & A; w/o, wheezes/ rales/ or rhonchi. no accessory muscle use, no dullness to percussion  CARD:  RRR, Gr 2/ 6 SM .  1-2 +  peripheral edema, pulses intact, no cyanosis or clubbing.  GI:   Soft & nt; nml bowel sounds; no organomegaly or masses detected.   Musco: Warm bil, no deformities or joint swelling noted.   Neuro: alert, no focal deficits noted.    Skin: Warm, no lesions or rashes    Lab Results:  CBC    Component Value Date/Time   WBC 9.7 09/25/2016 0828   RBC 5.07 09/25/2016 0828   HGB 14.0 09/25/2016 0828   HCT 43.0 09/25/2016 0828   PLT 230.0 09/25/2016 0828   MCV 84.9 09/25/2016 0828   MCH 27.9 08/14/2016 1311   MCHC 32.6 09/25/2016 0828   RDW 15.9 (H) 09/25/2016 0828   LYMPHSABS 1.8 09/25/2016 0828   MONOABS 0.9 09/25/2016 0828   EOSABS 0.2 09/25/2016 0828   BASOSABS 0.1 09/25/2016 0828    BMET    Component Value Date/Time   NA 139 12/23/2016 0927   NA 140 11/27/2016 1417   K 4.3 12/23/2016 0927   CL 103 12/23/2016 0927   CO2 29 12/23/2016 0927   GLUCOSE 126 (H) 12/23/2016 0927   GLUCOSE 87 06/30/2006 1132   BUN 20 12/23/2016 0927   BUN 21 11/27/2016 1417   CREATININE 0.72 12/23/2016 0927   CREATININE 0.74 08/14/2016 1311   CALCIUM 8.6 12/23/2016  0927   GFRNONAA 80 11/27/2016 1417   GFRAA 93 11/27/2016 1417    BNP No results found for: BNP  ProBNP No results found for: PROBNP  Imaging: No results found.   Assessment & Plan:   Asthma, intermittent Increased symptoms without flare  Would restart Symbiocrt  Check cxr   Plan  Patient Instructions  Check chest xray .  Restart Symbicort 2 puffs Twice daily  , rinse after use  Continue on Dymista 2 puffs daily .  Follow up with [name] in 2-3 months and As needed   Please contact office for sooner follow up if symptoms do not improve or worsen or seek emergency care       Allergic rhinitis Improved control on Dymista  Cont on current regimen      Rexene Edison, NP 01/03/2017

## 2017-01-03 NOTE — Assessment & Plan Note (Signed)
Improved control on Dymista  Cont on current regimen

## 2017-01-03 NOTE — Telephone Encounter (Signed)
I spoke with pt. He reports weight today is 282 lbs.  Taking furosemide twice daily.  He is still having shortness of breath with exertion.  I scheduled him to see Dr. Angelena Form on 01/06/17 at 4:20

## 2017-01-03 NOTE — Addendum Note (Signed)
Addended by: Parke Poisson E on: 01/03/2017 10:26 AM   Modules accepted: Orders

## 2017-01-03 NOTE — Assessment & Plan Note (Signed)
Increased symptoms without flare  Would restart Symbiocrt  Check cxr   Plan  Patient Instructions  Check chest xray .  Restart Symbicort 2 puffs Twice daily  , rinse after use  Continue on Dymista 2 puffs daily .  Follow up with [name] in 2-3 months and As needed   Please contact office for sooner follow up if symptoms do not improve or worsen or seek emergency care

## 2017-01-03 NOTE — Patient Instructions (Addendum)
Check chest xray .  Restart Symbicort 2 puffs Twice daily  , rinse after use  Continue on Dymista 2 puffs daily .  Follow up with [name] in 2-3 months and As needed   Please contact office for sooner follow up if symptoms do not improve or worsen or seek emergency care

## 2017-01-03 NOTE — Progress Notes (Signed)
Reviewed, may need Xolair given elevated IgE

## 2017-01-03 NOTE — Telephone Encounter (Signed)
We may not be able to adequately diurese him at home. He may need to be admitted to [facility] at some point over the next few weeks for diuresis with IV Lasix. Can we check with him today and see how he is feeling? If he is still having dyspnea, we will need to see him. I can see him Monday. Justin [name]

## 2017-01-06 ENCOUNTER — Encounter: Payer: Self-pay | Admitting: Cardiovascular Disease

## 2017-01-06 ENCOUNTER — Other Ambulatory Visit: Payer: Self-pay | Admitting: Pulmonary Disease

## 2017-01-06 ENCOUNTER — Encounter (INDEPENDENT_AMBULATORY_CARE_PROVIDER_SITE_OTHER): Payer: Self-pay

## 2017-01-06 ENCOUNTER — Ambulatory Visit (INDEPENDENT_AMBULATORY_CARE_PROVIDER_SITE_OTHER): Payer: Medicare Other | Admitting: Cardiovascular Disease

## 2017-01-06 VITALS — BP 116/62 | HR 82 | Ht 67.0 in | Wt 287.1 lb

## 2017-01-06 DIAGNOSIS — I5033 Acute on chronic diastolic (congestive) heart failure: Secondary | ICD-10-CM | POA: Diagnosis not present

## 2017-01-06 DIAGNOSIS — I10 Essential (primary) hypertension: Secondary | ICD-10-CM

## 2017-01-06 DIAGNOSIS — I350 Nonrheumatic aortic (valve) stenosis: Secondary | ICD-10-CM

## 2017-01-06 DIAGNOSIS — I25118 Atherosclerotic heart disease of native coronary artery with other forms of angina pectoris: Secondary | ICD-10-CM | POA: Diagnosis not present

## 2017-01-06 NOTE — Progress Notes (Signed)
Chief Complaint  Patient presents with  . Shortness of Breath    History of Present Illness: 83 yo male with history of HTN, HLD, moderate aortic stenosis, chronic diastolic CHF and CAD s/p 4V CABG March 2007 who is here today for cardiac follow up. Echo October 2017 normal EF, moderate aortic valve stenosis with mean gradient 28 mmHg, peak gradient 41 mmHg. When I saw him in December 2017 he was having chest pain and dyspnea. Cardiac cath January 2018 with chronic occlusion of the left main and RCA with 4/4 patent bypass grafts (LIMA to LAD, SVG to Diagonal, SVG to OM, SVG to RCA). Mean gradient across the aortic valve during cath  17 mmHg, AVA 1.61 cm2. Filling pressures elevated and he was started on Lasix. He was started on Imdur. I saw him 11/20/16 and his weight was up and he was having more dyspnea. His Lasix was increased and he had fair diuresis but since then he has not been able to diurese effectively at home with po Lasix, despite higher doses.   He is here today for follow up. He continues to have LE edema, dyspnea with minimal exertion. Weight is up 20 lbs over last 23 months. He denies any chest pain, palpitations, dizziness, near syncope or syncope.    Primary Care Physician: Laurey Morale, MD   Past Medical History:  Diagnosis Date  . Allergic    (sees Dr. Velora Heckler)  . Allergy   . Asthma    sees [name]   . Benign prostatic hypertrophy    (sees Dr. Denman George  . CAD (coronary artery disease)    sees Dr. Haroldine Laws   . Carotid bruit    carotid u/s 10/10: 0.39% bilaterally  . ED (erectile dysfunction)   . GERD (gastroesophageal reflux disease)   . Gout   . HTN (hypertension)   . Hx of colonic polyps    (sees Dr. Henrene Pastor)  . Hyperlipidemia   . Myocardial infarction ([place])   . Obesity   . Osteoarthritis    especially in left knee, (sees Dr. Alphonzo Severance )  . Precancerous skin lesion    (sees Dr. Allyson Sabal)  . Prostate cancer ([place])   . S/P CABG (coronary artery bypass  graft) 2007    Coronary artery bypass grafting x4 with left internal(Edward Servando Snare, MD)    Past Surgical History:  Procedure Laterality Date  . Bilateral subcutaneous mastectomies    . CARDIAC CATHETERIZATION  10/29/2005  . CARDIAC CATHETERIZATION N/A 08/27/2016   Procedure: Right/Left Heart Cath and Coronary/Graft Angiography;  Surgeon: Burnell Blanks, MD;  Location: [facility];  Service: Cardiovascular;  Laterality: N/A;  . CATARACT EXTRACTION, BILATERAL    . COLONOSCOPY  06/30/2008   no repeats needed   . CORONARY ARTERY BYPASS GRAFT  2007  . Hammer toe repairs bilaterally    . KNEE ARTHROPLASTY  07/30/2011   Procedure: COMPUTER ASSISTED TOTAL KNEE ARTHROPLASTY;  Surgeon: Meredith Pel;  Location: [facility];  Service: Orthopedics;  Laterality: Left;  left total knee arthroplasty  . Repair of right thumb injury    . REPLACEMENT TOTAL KNEE BILATERAL  2012    Current Outpatient Prescriptions  Medication Sig Dispense Refill  . aspirin 81 MG tablet Take 81 mg by mouth every evening.     [name] atorvastatin (LIPITOR) 20 MG tablet Take 20 mg by mouth daily.    . Azelastine-Fluticasone (DYMISTA) 137-50 MCG/ACT SUSP Place 2 puffs into the nose 2 (two) times daily.  1 Bottle 6  . budesonide-formoterol (SYMBICORT) 80-4.5 MCG/ACT inhaler Inhale 2 puffs into the lungs 2 (two) times daily. 1 Inhaler 5  . diclofenac (VOLTAREN) 75 MG EC tablet Take 75 mg by mouth 2 (two) times daily as needed (pain).    . furosemide (LASIX) 40 MG tablet Take one tablet by mouth daily alternating with one tablet twice daily 90 tablet 3  . ipratropium (ATROVENT) 0.03 % nasal spray Place 2 sprays into both nostrils every 12 (twelve) hours.    . isosorbide mononitrate (IMDUR) 30 MG 24 hr tablet Take 1 tablet (30 mg total) by mouth daily. 90 tablet 3  . levothyroxine (SYNTHROID, LEVOTHROID) 75 MCG tablet Take 1 tablet (75 mcg total) by mouth daily. 90 tablet 3  . loratadine (CLARITIN) 10 MG tablet Take 10 mg by  mouth daily.    . metoprolol (LOPRESSOR) 50 MG tablet Take 1 tablet (50 mg total) by mouth 2 (two) times daily. 180 tablet 2  . montelukast (SINGULAIR) 10 MG tablet TAKE 1 TABLET IN THE EVENING. 60 tablet 5  . Multiple Vitamins-Minerals (PRESERVISION AREDS PO) Take 2 tablets by mouth every morning.    . potassium chloride SA (K-DUR,KLOR-CON) 20 MEQ tablet Take 1 tablet (20 mEq total) by mouth daily. Take one extra tablet on days you take extra furosemide 90 tablet 3  . Probiotic Product (ALIGN) 4 MG CAPS Take 1 capsule by mouth daily.    . ranitidine (ZANTAC) 150 MG capsule Take 150 mg by mouth 2 (two) times daily.    [name] triamcinolone (NASACORT ALLERGY 24HR) 55 MCG/ACT AERO nasal inhaler Place 2 sprays into the nose daily.    . valACYclovir (VALTREX) 500 MG tablet Take 500 mg by mouth as needed (Take as directed for break outs).      No current facility-administered medications for this visit.     Allergies  Allergen Reactions  . Amlodipine     Swelling in ankles  . Peanut-Containing Drug Products Anaphylaxis  . Lisinopril     cough  . Prednisone     Constipation & "he could not sleep" **Can tolerate in certain situations**  . Sulfonamide Derivatives     REACTION: anaphylaxis    Social History   Social History  . Marital status: Married    Spouse name: N/A  . Number of children: N/A  . Years of education: N/A   Occupational History  . general contractor     builds malls   Social History Main Topics  . Smoking status: Former Smoker    Packs/day: 3.00    Years: 6.00    Types: Cigarettes    Quit date: 08/26/1958  . Smokeless tobacco: Never Used  . Alcohol use 0.0 oz/week     Comment: 2-3 per week  . Drug use: No  . Sexual activity: Not on file   Other Topics Concern  . Not on file   Social History Narrative   FH of CAD, Male 1st degree relative less than age 64.    Family History  Problem Relation Age of Onset  . Heart attack Father 64  . Heart failure Mother 63   . Uterine cancer Mother     Review of Systems:  As stated in the HPI and otherwise negative.   BP 116/62   Pulse 82   Ht 5\' 7"  (1.702 m)   Wt 287 lb 1.9 oz (130.2 kg)   SpO2 94%   BMI 44.97 kg/m   Physical Examination:  General: Well  developed, well nourished, NAD  HEENT: OP clear, mucus membranes moist  SKIN: warm, dry. No rashes. Neuro: No focal deficits  Musculoskeletal: Muscle strength 5/5 all ext  Psychiatric: Mood and affect normal  Neck: No JVD, no carotid bruits, no thyromegaly, no lymphadenopathy.  Lungs:Clear bilaterally, no wheezes, rhonci, crackles Cardiovascular: Regular rate and rhythm. No murmurs, gallops or rubs. Abdomen:Soft. Bowel sounds present. Non-tender.  Extremities: 2 + bilateral LE edema.   Echo 05/31/16: Left ventricle: The cavity size was normal. Wall thickness was   increased in a pattern of moderate LVH. Systolic function was   normal. The estimated ejection fraction was in the range of 55%   to 60%. Doppler parameters are consistent with a reversible   restrictive pattern, indicative of decreased left ventricular   diastolic compliance and/or increased left atrial pressure (grade   3 diastolic dysfunction). Doppler parameters are consistent with   high ventricular filling pressure. - Aortic valve: AV is thickened, calcified with restricted motion   Difficult to see well Peak and mean gradients through the valve   are 41 and 28 mm Hg respectively consistent with moderate AS - Mitral valve: Calcified annulus. Mildly thickened leaflets . - Left atrium: The atrium was mildly dilated.  Impressions:  - COmpared to report of echo from 2016 there is no significant   change.  Cardiac cath 08/27/16: Diagnostic Diagram          EKG:  EKG is not ordered today The ekg ordered today demonstrates   Recent Labs: 09/25/2016: ALT 13; Hemoglobin 14.0; Platelets 230.0 12/23/2016: BUN 20; Creatinine, Ser 0.72; Potassium 4.3; Sodium 139; TSH 2.32    Lipid Panel    Component Value Date/Time   CHOL 109 09/25/2016 0828   TRIG 133.0 09/25/2016 0828   TRIG 143 06/30/2006 1132   HDL 29.80 (L) 09/25/2016 0828   CHOLHDL 4 09/25/2016 0828   VLDL 26.6 09/25/2016 0828   LDLCALC 53 09/25/2016 0828     Wt Readings from Last 3 Encounters:  01/06/17 287 lb 1.9 oz (130.2 kg)  01/03/17 288 lb 6.4 oz (130.8 kg)  12/23/16 285 lb (129.3 kg)     Other studies Reviewed: Additional studies/ records that were reviewed today include:  Review of the above records demonstrates:    Assessment and Plan:   1. CAD s/p CABG with angina: He is not having chest pain currently while on Imdur. Still having dyspnea. He is s/p 4V CABG in 2007. Last cath in January 2018 with 4/4 patent bypass grafts. Continue ASA, statin, beta blocker and Imdur.     2. Aortic valve stenosis: Moderate stenosis by echo October 2017 with moderate gradient by cath in January 2018. I do not think this is largely contributory to his CHF but I will repeat an echo while he is admitted to [facility] this week to assess the valve.     3. HTN: BP controlled. No changes.   4. HLD: LDL at goal. Continue statin.   5. Acute on Chronic diastolic CHF: He has grade 3 diastolic dysfunction by echo last year. He has gradually become volume overloaded over the last year. I cannot effectively diurese him at home. I will admit him to [facility] tomorrow for IV diuresis. Will arrange echo at [facility] to assess aortic valve.   Admitting has been called. He will come to [facility] in am when bed available. Would start Lasix 60 mg IV BID. He will need an echo.    Current medicines are reviewed at length  with the patient today.  The patient does not have concerns regarding medicines.  The following changes have been made:  no change  Labs/ tests ordered today include:   No orders of the defined types were placed in this encounter.  Disposition:   FU with me after discharge.    Signed, [name],  MD 01/06/2017 6:35 PM    [address], [place], [place]  [address] Phone: [phone]; Fax: [phone]

## 2017-01-06 NOTE — Patient Instructions (Signed)
Medication Instructions:  Your physician recommends that you continue on your current medications as directed. Please refer to the Current Medication list given to you today.   Labwork: None today  Testing/Procedures: None today  Follow-Up: [facility] will call you tomorrow with room assignment.   Any Other Special Instructions Will Be Listed Below (If Applicable).     If you need a refill on your cardiac medications before your next appointment, please call your pharmacy.

## 2017-01-07 ENCOUNTER — Encounter (HOSPITAL_COMMUNITY): Payer: Self-pay | Admitting: General Practice

## 2017-01-07 ENCOUNTER — Inpatient Hospital Stay (HOSPITAL_COMMUNITY)
Admission: RE | Admit: 2017-01-07 | Discharge: 2017-01-10 | DRG: 292 | Disposition: A | Discharge status: Home / Self Care | Payer: Medicare Other | Source: Ambulatory Visit | Attending: Cardiovascular Disease | Admitting: Cardiovascular Disease

## 2017-01-07 DIAGNOSIS — I509 Heart failure, unspecified: Secondary | ICD-10-CM | POA: Diagnosis not present

## 2017-01-07 DIAGNOSIS — Z888 Allergy status to other drugs, medicaments and biological substances status: Secondary | ICD-10-CM | POA: Diagnosis not present

## 2017-01-07 DIAGNOSIS — N400 Enlarged prostate without lower urinary tract symptoms: Secondary | ICD-10-CM | POA: Diagnosis present

## 2017-01-07 DIAGNOSIS — I2510 Atherosclerotic heart disease of native coronary artery without angina pectoris: Secondary | ICD-10-CM | POA: Diagnosis present

## 2017-01-07 DIAGNOSIS — M1990 Unspecified osteoarthritis, unspecified site: Secondary | ICD-10-CM | POA: Diagnosis present

## 2017-01-07 DIAGNOSIS — K219 Gastro-esophageal reflux disease without esophagitis: Secondary | ICD-10-CM | POA: Diagnosis not present

## 2017-01-07 DIAGNOSIS — I252 Old myocardial infarction: Secondary | ICD-10-CM | POA: Diagnosis not present

## 2017-01-07 DIAGNOSIS — E876 Hypokalemia: Secondary | ICD-10-CM | POA: Diagnosis not present

## 2017-01-07 DIAGNOSIS — I5020 Unspecified systolic (congestive) heart failure: Secondary | ICD-10-CM

## 2017-01-07 DIAGNOSIS — Z7982 Long term (current) use of aspirin: Secondary | ICD-10-CM

## 2017-01-07 DIAGNOSIS — Z6841 Body Mass Index (BMI) 40.0 and over, adult: Secondary | ICD-10-CM

## 2017-01-07 DIAGNOSIS — I350 Nonrheumatic aortic (valve) stenosis: Secondary | ICD-10-CM

## 2017-01-07 DIAGNOSIS — I5043 Acute on chronic combined systolic (congestive) and diastolic (congestive) heart failure: Secondary | ICD-10-CM | POA: Diagnosis not present

## 2017-01-07 DIAGNOSIS — Z79899 Other long term (current) drug therapy: Secondary | ICD-10-CM | POA: Diagnosis not present

## 2017-01-07 DIAGNOSIS — T502X5A Adverse effect of carbonic-anhydrase inhibitors, benzothiadiazides and other diuretics, initial encounter: Secondary | ICD-10-CM | POA: Diagnosis not present

## 2017-01-07 DIAGNOSIS — I110 Hypertensive heart disease with heart failure: Secondary | ICD-10-CM | POA: Diagnosis not present

## 2017-01-07 DIAGNOSIS — R0602 Shortness of breath: Secondary | ICD-10-CM | POA: Diagnosis not present

## 2017-01-07 DIAGNOSIS — Z951 Presence of aortocoronary bypass graft: Secondary | ICD-10-CM | POA: Diagnosis not present

## 2017-01-07 DIAGNOSIS — E785 Hyperlipidemia, unspecified: Secondary | ICD-10-CM | POA: Diagnosis present

## 2017-01-07 DIAGNOSIS — R0600 Dyspnea, unspecified: Secondary | ICD-10-CM

## 2017-01-07 DIAGNOSIS — Z87891 Personal history of nicotine dependence: Secondary | ICD-10-CM

## 2017-01-07 DIAGNOSIS — I5033 Acute on chronic diastolic (congestive) heart failure: Secondary | ICD-10-CM | POA: Diagnosis not present

## 2017-01-07 DIAGNOSIS — Z91010 Allergy to peanuts: Secondary | ICD-10-CM | POA: Diagnosis not present

## 2017-01-07 DIAGNOSIS — Z882 Allergy status to sulfonamides status: Secondary | ICD-10-CM

## 2017-01-07 DIAGNOSIS — I5022 Chronic systolic (congestive) heart failure: Secondary | ICD-10-CM

## 2017-01-07 DIAGNOSIS — Z7951 Long term (current) use of inhaled steroids: Secondary | ICD-10-CM | POA: Diagnosis not present

## 2017-01-07 DIAGNOSIS — J45909 Unspecified asthma, uncomplicated: Secondary | ICD-10-CM | POA: Diagnosis present

## 2017-01-07 DIAGNOSIS — Z96653 Presence of artificial knee joint, bilateral: Secondary | ICD-10-CM | POA: Diagnosis not present

## 2017-01-07 HISTORY — DX: Other complications of anesthesia, initial encounter: T88.59XA

## 2017-01-07 HISTORY — DX: Nonexudative age-related macular degeneration, bilateral, stage unspecified: H35.3130

## 2017-01-07 HISTORY — DX: Adverse effect of unspecified anesthetic, initial encounter: T41.45XA

## 2017-01-07 HISTORY — DX: Hypothyroidism, unspecified: E03.9

## 2017-01-07 HISTORY — DX: Family history of other specified conditions: Z84.89

## 2017-01-07 LAB — CBC WITH DIFFERENTIAL/PLATELET
BASOS PCT: 0 %
Basophils Absolute: 0.1 10*3/uL (ref 0.0–0.1)
Eosinophils Absolute: 0.5 10*3/uL (ref 0.0–0.7)
Eosinophils Relative: 4 %
HEMATOCRIT: 41.5 % (ref 39.0–52.0)
HEMOGLOBIN: 12.9 g/dL — AB (ref 13.0–17.0)
Lymphocytes Relative: 19 %
Lymphs Abs: 2.2 10*3/uL (ref 0.7–4.0)
MCH: 26.7 pg (ref 26.0–34.0)
MCHC: 31.1 g/dL (ref 30.0–36.0)
MCV: 85.7 fL (ref 78.0–100.0)
MONOS PCT: 13 %
Monocytes Absolute: 1.5 10*3/uL — ABNORMAL HIGH (ref 0.1–1.0)
NEUTROS ABS: 7.4 10*3/uL (ref 1.7–7.7)
NEUTROS PCT: 64 %
Platelets: 212 10*3/uL (ref 150–400)
RBC: 4.84 MIL/uL (ref 4.22–5.81)
RDW: 16.6 % — ABNORMAL HIGH (ref 11.5–15.5)
WBC: 11.6 10*3/uL — AB (ref 4.0–10.5)

## 2017-01-07 LAB — COMPREHENSIVE METABOLIC PANEL
ALBUMIN: 3 g/dL — AB (ref 3.5–5.0)
ALK PHOS: 69 U/L (ref 38–126)
ALT: 27 U/L (ref 17–63)
ANION GAP: 7 (ref 5–15)
AST: 29 U/L (ref 15–41)
BILIRUBIN TOTAL: 0.9 mg/dL (ref 0.3–1.2)
BUN: 16 mg/dL (ref 6–20)
CALCIUM: 8.1 mg/dL — AB (ref 8.9–10.3)
CO2: 29 mmol/L (ref 22–32)
CREATININE: 0.86 mg/dL (ref 0.61–1.24)
Chloride: 103 mmol/L (ref 101–111)
GFR calc Af Amer: >60 mL/min (ref >60)
GFR calc non Af Amer: >60 mL/min (ref >60)
GLUCOSE: 87 mg/dL (ref 65–99)
Potassium: 3.1 mmol/L — ABNORMAL LOW (ref 3.5–5.1)
SODIUM: 139 mmol/L (ref 135–145)
Total Protein: 6.4 g/dL — ABNORMAL LOW (ref 6.5–8.1)

## 2017-01-07 LAB — MAGNESIUM: Magnesium: 2 mg/dL (ref 1.7–2.4)

## 2017-01-07 LAB — BRAIN NATRIURETIC PEPTIDE: B Natriuretic Peptide: 653.3 pg/mL — ABNORMAL HIGH (ref 0.0–100.0)

## 2017-01-07 MED ORDER — FUROSEMIDE 10 MG/ML IJ SOLN
60.0000 mg | Freq: Two times a day (BID) | INTRAMUSCULAR | Status: DC
  Administered 2017-01-07 – 2017-01-08 (×3): 60 mg via INTRAVENOUS
  Filled 2017-01-07 (×3): qty 6

## 2017-01-07 MED ORDER — FAMOTIDINE 20 MG PO TABS
20.0000 mg | ORAL_TABLET | Freq: Every day | ORAL | Status: DC
  Administered 2017-01-08 – 2017-01-10 (×3): 20 mg via ORAL
  Filled 2017-01-07 (×3): qty 1

## 2017-01-07 MED ORDER — ATORVASTATIN CALCIUM 20 MG PO TABS
20.0000 mg | ORAL_TABLET | Freq: Every day | ORAL | Status: DC
  Administered 2017-01-08 – 2017-01-10 (×3): 20 mg via ORAL
  Filled 2017-01-07 (×3): qty 1

## 2017-01-07 MED ORDER — MONTELUKAST SODIUM 10 MG PO TABS
10.0000 mg | ORAL_TABLET | Freq: Every evening | ORAL | Status: DC
  Administered 2017-01-08 – 2017-01-09 (×2): 10 mg via ORAL
  Filled 2017-01-07 (×2): qty 1

## 2017-01-07 MED ORDER — IPRATROPIUM BROMIDE 0.06 % NA SOLN
2.0000 | Freq: Two times a day (BID) | NASAL | Status: DC
  Administered 2017-01-09 – 2017-01-10 (×3): 2 via NASAL
  Filled 2017-01-07: qty 15
  Filled 2017-01-07: qty 30

## 2017-01-07 MED ORDER — ISOSORBIDE MONONITRATE ER 30 MG PO TB24
30.0000 mg | ORAL_TABLET | Freq: Every day | ORAL | Status: DC
  Administered 2017-01-08 – 2017-01-10 (×3): 30 mg via ORAL
  Filled 2017-01-07 (×3): qty 1

## 2017-01-07 MED ORDER — POTASSIUM CHLORIDE CRYS ER 20 MEQ PO TBCR
20.0000 meq | EXTENDED_RELEASE_TABLET | Freq: Every day | ORAL | Status: DC
  Administered 2017-01-08: 20 meq via ORAL
  Filled 2017-01-07: qty 1

## 2017-01-07 MED ORDER — SODIUM CHLORIDE 0.9% FLUSH
3.0000 mL | Freq: Two times a day (BID) | INTRAVENOUS | Status: DC
  Administered 2017-01-07 – 2017-01-10 (×7): 3 mL via INTRAVENOUS

## 2017-01-07 MED ORDER — LOSARTAN POTASSIUM 25 MG PO TABS
25.0000 mg | ORAL_TABLET | Freq: Every day | ORAL | Status: DC
  Administered 2017-01-08 – 2017-01-10 (×3): 25 mg via ORAL
  Filled 2017-01-07 (×3): qty 1

## 2017-01-07 MED ORDER — TRIAMCINOLONE ACETONIDE 55 MCG/ACT NA AERO: 2.0000 | INHALATION_SPRAY | Freq: Every day | NASAL | Status: DC

## 2017-01-07 MED ORDER — ACETAMINOPHEN 325 MG PO TABS: 650.0000 mg | ORAL_TABLET | ORAL | Status: DC | PRN

## 2017-01-07 MED ORDER — ASPIRIN EC 81 MG PO TBEC
81.0000 mg | DELAYED_RELEASE_TABLET | Freq: Every evening | ORAL | Status: DC
Start: 2017-01-07 — End: 2017-01-10
  Administered 2017-01-07 – 2017-01-09 (×3): 81 mg via ORAL
  Filled 2017-01-07 (×3): qty 1

## 2017-01-07 MED ORDER — LISINOPRIL 5 MG PO TABS: 5.0000 mg | ORAL_TABLET | Freq: Every day | ORAL | Status: DC

## 2017-01-07 MED ORDER — ALIGN 4 MG PO CAPS: 1.0000 | ORAL_CAPSULE | Freq: Every day | ORAL | Status: DC

## 2017-01-07 MED ORDER — ENOXAPARIN SODIUM 40 MG/0.4ML ~~LOC~~ SOLN
40.0000 mg | SUBCUTANEOUS | Status: DC
  Administered 2017-01-07 – 2017-01-09 (×3): 40 mg via SUBCUTANEOUS
  Filled 2017-01-07 (×3): qty 0.4

## 2017-01-07 MED ORDER — MOMETASONE FURO-FORMOTEROL FUM 100-5 MCG/ACT IN AERO
2.0000 | INHALATION_SPRAY | Freq: Two times a day (BID) | RESPIRATORY_TRACT | Status: DC
Start: 2017-01-07 — End: 2017-01-10
  Administered 2017-01-07 – 2017-01-10 (×6): 2 via RESPIRATORY_TRACT
  Filled 2017-01-07: qty 8.8

## 2017-01-07 MED ORDER — LEVOTHYROXINE SODIUM 75 MCG PO TABS
75.0000 ug | ORAL_TABLET | Freq: Every day | ORAL | Status: DC
  Administered 2017-01-08 – 2017-01-10 (×3): 75 ug via ORAL
  Filled 2017-01-07 (×3): qty 1

## 2017-01-07 MED ORDER — SACCHAROMYCES BOULARDII 250 MG PO CAPS
250.0000 mg | ORAL_CAPSULE | Freq: Every day | ORAL | Status: DC
Start: 2017-01-08 — End: 2017-01-10
  Administered 2017-01-08 – 2017-01-10 (×3): 250 mg via ORAL
  Filled 2017-01-07 (×3): qty 1

## 2017-01-07 MED ORDER — SODIUM CHLORIDE 0.9 % IV SOLN: 250.0000 mL | INTRAVENOUS | Status: DC | PRN

## 2017-01-07 MED ORDER — METOPROLOL TARTRATE 50 MG PO TABS
50.0000 mg | ORAL_TABLET | Freq: Two times a day (BID) | ORAL | Status: DC
  Administered 2017-01-07 – 2017-01-10 (×6): 50 mg via ORAL
  Filled 2017-01-07 (×6): qty 1

## 2017-01-07 MED ORDER — LORATADINE 10 MG PO TABS
10.0000 mg | ORAL_TABLET | Freq: Every day | ORAL | Status: DC
  Administered 2017-01-08 – 2017-01-10 (×3): 10 mg via ORAL
  Filled 2017-01-07 (×3): qty 1

## 2017-01-07 MED ORDER — ONDANSETRON HCL 4 MG/2ML IJ SOLN: 4.0000 mg | Freq: Four times a day (QID) | INTRAMUSCULAR | Status: DC | PRN

## 2017-01-07 MED ORDER — SODIUM CHLORIDE 0.9% FLUSH: 3.0000 mL | INTRAVENOUS | Status: DC | PRN

## 2017-01-07 MED ORDER — AZELASTINE-FLUTICASONE 137-50 MCG/ACT NA SUSP: 2.0000 | Freq: Two times a day (BID) | NASAL | Status: DC

## 2017-01-07 NOTE — Progress Notes (Signed)
Patient arrived to 3E16 from doctor's office. MD paged of patient arrival. VSS. TELE applied and confirmed.  Ave Filter, RN

## 2017-01-07 NOTE — H&P (Signed)
Patient ID: Justin [name] MRN: [id number] DOB/AGE: November 25, 1933 83 y.o. Admit date: 01/07/2017  Primary Care Physician:Fry, Ishmael Holter, MD Primary Cardiologist: Angelena Form  HPI: 83 yo male with history of chronic diastolic CHF, moderate aortic valve stenosis, hypertensive heart disease, HLD, CAD s/p 4V CABG March 2007 who is being admitted today secondary to volume overload. He is known to have chronic diastolic CHF with grade 3 diastolic dysfunction noted on echo in 2017. He has moderate AS. Cardiac cath January 2018 with 4 patent bypass grafts and elevated filling pressures. I have attempted diuresis at home but he has continued to have progressive dyspnea, weight gain and LE edema. He is not felt to have severe AS.   He denies chest pain. He does endorse dyspnea with minimal exertion, orthopnea, PND, LE edema, fatigue. No dizziness, near syncope or syncope.   Review of systems complete and found to be negative unless listed above   Past Medical History:  Diagnosis Date  . Allergic    (sees Dr. Velora Heckler)  . Allergy   . Asthma    sees [name]   . Benign prostatic hypertrophy    (sees Dr. Denman George  . CAD (coronary artery disease)    sees Dr. Haroldine Laws   . Carotid bruit    carotid u/s 10/10: 0.39% bilaterally  . ED (erectile dysfunction)   . GERD (gastroesophageal reflux disease)   . Gout   . HTN (hypertension)   . Hx of colonic polyps    (sees Dr. Henrene Pastor)  . Hyperlipidemia   . Myocardial infarction ([place])   . Obesity   . Osteoarthritis    especially in left knee, (sees Dr. Alphonzo Severance )  . Precancerous skin lesion    (sees Dr. Allyson Sabal)  . Prostate cancer ([place])   . S/P CABG (coronary artery bypass graft) 2007    Coronary artery bypass grafting x4 with left internal(Edward Servando Snare, MD)    Family History  Problem Relation Age of Onset  . Heart attack Father 62  . Heart failure Mother 59  . Uterine cancer Mother     Social History   Social History  . Marital  status: Married    Spouse name: N/A  . Number of children: N/A  . Years of education: N/A   Occupational History  . general contractor     builds malls   Social History Main Topics  . Smoking status: Former Smoker    Packs/day: 3.00    Years: 6.00    Types: Cigarettes    Quit date: 08/26/1958  . Smokeless tobacco: Never Used  . Alcohol use 0.0 oz/week     Comment: 2-3 per week  . Drug use: No  . Sexual activity: Not on file   Other Topics Concern  . Not on file   Social History Narrative   FH of CAD, Male 1st degree relative less than age 46.    Past Surgical History:  Procedure Laterality Date  . Bilateral subcutaneous mastectomies    . CARDIAC CATHETERIZATION  10/29/2005  . CARDIAC CATHETERIZATION N/A 08/27/2016   Procedure: Right/Left Heart Cath and Coronary/Graft Angiography;  Surgeon: Burnell Blanks, MD;  Location: [facility];  Service: Cardiovascular;  Laterality: N/A;  . CATARACT EXTRACTION, BILATERAL    . COLONOSCOPY  06/30/2008   no repeats needed   . CORONARY ARTERY BYPASS GRAFT  2007  . Hammer toe repairs bilaterally    . KNEE ARTHROPLASTY  07/30/2011   Procedure: COMPUTER ASSISTED  TOTAL KNEE ARTHROPLASTY;  Surgeon: Meredith Pel;  Location: [facility];  Service: Orthopedics;  Laterality: Left;  left total knee arthroplasty  . Repair of right thumb injury    . REPLACEMENT TOTAL KNEE BILATERAL  2012    Allergies  Allergen Reactions  . Amlodipine     Swelling in ankles  . Peanut-Containing Drug Products Anaphylaxis  . Lisinopril     cough  . Prednisone     Constipation & "he could not sleep" **Can tolerate in certain situations**  . Sulfonamide Derivatives     REACTION: anaphylaxis    Prior to Admission Meds:  Prior to Admission medications   Medication Sig Start Date End Date Taking? Authorizing Provider  aspirin 81 MG tablet Take 81 mg by mouth every evening.     [provider]  atorvastatin (LIPITOR) 20 MG tablet Take 20 mg by  mouth daily.    [provider]  Azelastine-Fluticasone (DYMISTA) 137-50 MCG/ACT SUSP Place 2 puffs into the nose 2 (two) times daily. 12/02/16   Juanito Doom, MD  budesonide-formoterol (SYMBICORT) 80-4.5 MCG/ACT inhaler Inhale 2 puffs into the lungs 2 (two) times daily. 10/07/16   Parrett, Fonnie Mu, NP  diclofenac (VOLTAREN) 75 MG EC tablet Take 75 mg by mouth 2 (two) times daily as needed (pain).    [provider]  furosemide (LASIX) 40 MG tablet Take one tablet by mouth daily alternating with one tablet twice daily 12/06/16   Burnell Blanks, MD  ipratropium (ATROVENT) 0.03 % nasal spray Place 2 sprays into both nostrils every 12 (twelve) hours.    [provider]  isosorbide mononitrate (IMDUR) 30 MG 24 hr tablet Take 1 tablet (30 mg total) by mouth daily. 09/17/16   Laurey Morale, MD  levothyroxine (SYNTHROID, LEVOTHROID) 75 MCG tablet Take 1 tablet (75 mcg total) by mouth daily. 09/30/16   Laurey Morale, MD  loratadine (CLARITIN) 10 MG tablet Take 10 mg by mouth daily.    [provider]  metoprolol (LOPRESSOR) 50 MG tablet Take 1 tablet (50 mg total) by mouth 2 (two) times daily. 07/05/16   Burnell Blanks, MD  montelukast (SINGULAIR) 10 MG tablet TAKE 1 TABLET IN THE EVENING. 01/06/17   Juanito Doom, MD  Multiple Vitamins-Minerals (PRESERVISION AREDS PO) Take 2 tablets by mouth every morning.    [provider]  potassium chloride SA (K-DUR,KLOR-CON) 20 MEQ tablet Take 1 tablet (20 mEq total) by mouth daily. Take one extra tablet on days you take extra furosemide 12/06/16   Burnell Blanks, MD  Probiotic Product (ALIGN) 4 MG CAPS Take 1 capsule by mouth daily.    [provider]  ranitidine (ZANTAC) 150 MG capsule Take 150 mg by mouth 2 (two) times daily.    [provider]  triamcinolone (NASACORT ALLERGY 24HR) 55 MCG/ACT AERO nasal inhaler Place 2 sprays into the nose daily.    [provider]  valACYclovir (VALTREX) 500 MG tablet Take 500 mg by mouth as needed (Take as directed for break outs).     [provider]    Physical Exam: Blood pressure (!) 112/55, pulse 66, temperature 97.9 F (36.6 C), temperature source Oral, resp. rate 17, height 5\' 7"  (1.702 m), weight 289 lb 6.4 oz (131.3 kg), SpO2 97 %.    General: Well developed, obese WM. NAD  HEENT: OP clear, mucus membranes moist  SKIN: warm, dry. No rashes.  Neuro: No focal deficits  Musculoskeletal: Muscle strength  5/5 all ext  Psychiatric: Mood and affect normal  Neck: + JVD, no carotid bruits, no thyromegaly, no lymphadenopathy.  Lungs:Clear bilaterally, no wheezes, rhonci, crackles  Cardiovascular: Regular rate and rhythm. Loud harsh systolic murmur noted. No gallops or rubs.  Abdomen:Soft. Bowel sounds present. Non-tender.  Extremities: 2+ bilateral lower extremity edema.    Labs: pending    Radiology: pending  EKG: pending  ASSESSMENT AND PLAN: 83 yo male with history of CAD s/p CABG, chronic diastolic chf, HTN, HLD and moderate AS admitted with acute on chronic diastolic CHF.   1. Acute on chronic diastolic CHF:  He is volume overloaded despite attempts to diurese at home with po Lasix. He has severe diastolic dysfunction and moderate AS. Will admit for diuresis with IV Lasix. Repeat echo to assess LV function and aortic valve. Will add Lisinopril.   2. CAD s/p CABG: He has no chest pain. 4 patent bypass grafts patent by cath in January 2018. Continue ASA, statin, beta blocker and Imdur.      3. Aortic valve stenosis: Moderate by echo October 2017. Low gradient by cath January 2017. Repeat ech now. May be playing some part in his volume overload.   4. HTN: BP controlled. Continue current therapy.   5. HLD: Continue statin.   Darlina Guys, MD 01/07/2017, 2:50 PM

## 2017-01-07 NOTE — Progress Notes (Signed)
Received a call from [facility], patient had PVC's and possible vtach, non sustained. Patient asymptomatic, will continue to monitor.

## 2017-01-08 ENCOUNTER — Inpatient Hospital Stay (HOSPITAL_COMMUNITY): Payer: Medicare Other

## 2017-01-08 LAB — BASIC METABOLIC PANEL
ANION GAP: 9 (ref 5–15)
BUN: 14 mg/dL (ref 6–20)
CHLORIDE: 103 mmol/L (ref 101–111)
CO2: 27 mmol/L (ref 22–32)
Calcium: 8 mg/dL — ABNORMAL LOW (ref 8.9–10.3)
Creatinine, Ser: 0.82 mg/dL (ref 0.61–1.24)
GFR calc Af Amer: >60 mL/min (ref >60)
GLUCOSE: 104 mg/dL — AB (ref 65–99)
POTASSIUM: 3 mmol/L — AB (ref 3.5–5.1)
Sodium: 139 mmol/L (ref 135–145)

## 2017-01-08 MED ORDER — POTASSIUM CHLORIDE CRYS ER 20 MEQ PO TBCR
20.0000 meq | EXTENDED_RELEASE_TABLET | Freq: Two times a day (BID) | ORAL | Status: DC
  Administered 2017-01-08: 20 meq via ORAL
  Filled 2017-01-08 (×2): qty 1

## 2017-01-08 MED ORDER — FUROSEMIDE 80 MG PO TABS
80.0000 mg | ORAL_TABLET | Freq: Two times a day (BID) | ORAL | Status: DC
  Administered 2017-01-09 – 2017-01-10 (×3): 80 mg via ORAL
  Filled 2017-01-08 (×3): qty 1

## 2017-01-08 MED ORDER — POTASSIUM CHLORIDE CRYS ER 20 MEQ PO TBCR
40.0000 meq | EXTENDED_RELEASE_TABLET | Freq: Once | ORAL | Status: AC
  Administered 2017-01-08: 40 meq via ORAL
  Filled 2017-01-08: qty 2

## 2017-01-08 NOTE — Progress Notes (Signed)
CCMD called, 5 beat run of vtach. Patient asymptomatic. Will continue to monitor.

## 2017-01-08 NOTE — Progress Notes (Addendum)
Progress Note  Patient Name: Justin [name] Date of Encounter: 01/08/2017  Primary Cardiologist: Dr. Angelena Form  Subjective   Patient if feeling much better. Breathing and edema is much improved. He has been up walking in the halls with no DOE.  Inpatient Medications    Scheduled Meds: . aspirin EC  81 mg Oral QPM  . atorvastatin  20 mg Oral Daily  . enoxaparin (LOVENOX) injection  40 mg Subcutaneous Q24H  . famotidine  20 mg Oral Daily  . furosemide  60 mg Intravenous BID  . ipratropium  2 spray Each Nare Q12H  . isosorbide mononitrate  30 mg Oral Daily  . levothyroxine  75 mcg Oral QAC breakfast  . loratadine  10 mg Oral Daily  . losartan  25 mg Oral Daily  . metoprolol tartrate  50 mg Oral BID  . mometasone-formoterol  2 puff Inhalation BID  . montelukast  10 mg Oral QPM  . potassium chloride SA  20 mEq Oral Daily  . saccharomyces boulardii  250 mg Oral Daily  . sodium chloride flush  3 mL Intravenous Q12H   Continuous Infusions: . sodium chloride     PRN Meds: sodium chloride, acetaminophen, ondansetron (ZOFRAN) IV, sodium chloride flush   Vital Signs    Vitals:   01/08/17 0148 01/08/17 0552 01/08/17 0848 01/08/17 0937  BP: (!) 124/52 (!) 131/49  (!) 122/50  Pulse: 61 (!) 55  71  Resp:  18    Temp: 98.4 F (36.9 C) 97.5 F (36.4 C)    TempSrc: Oral Oral    SpO2: 94% 94% 95%   Weight:  278 lb 11.2 oz (126.4 kg)    Height:        Intake/Output Summary (Last 24 hours) at 01/08/17 1549 Last data filed at 01/08/17 1100  Gross per 24 hour  Intake              703 ml  Output             3475 ml  Net            -2772 ml   Filed Weights   01/07/17 1434 01/07/17 1642 01/08/17 0552  Weight: 289 lb 6.4 oz (131.3 kg) 282 lb 1.6 oz (128 kg) 278 lb 11.2 oz (126.4 kg)    Telemetry    Sinus rhythm with PVC's. Had 4 beats of WCT last night - Personally Reviewed  ECG    Sinus rhythm/1st degree AV block,  63 bpm, PVC's, non-specific ST/T changes laterally. -  Personally Reviewed  Physical Exam   GEN: No acute distress.   Neck: No JVD Cardiac: RRR, rubs, or gallops. 2/6 soft systolic murmur in RUSB Respiratory: Clear to auscultation bilaterally. GI: Soft, nontender, non-distended  MS: 1+ lower leg edema; No deformity. Neuro:  Nonfocal  Psych: Normal affect   Labs    Chemistry Recent Labs Lab 01/07/17 1705 01/08/17 0537  NA 139 139  K 3.1* 3.0*  CL 103 103  CO2 29 27  GLUCOSE 87 104*  BUN 16 14  CREATININE 0.86 0.82  CALCIUM 8.1* 8.0*  PROT 6.4*  --   ALBUMIN 3.0*  --   AST 29  --   ALT 27  --   ALKPHOS 69  --   BILITOT 0.9  --   GFRNONAA >60 >60  GFRAA >60 >60  ANIONGAP 7 9     Hematology Recent Labs Lab 01/07/17 1705  WBC 11.6*  RBC 4.84  HGB  12.9*  HCT 41.5  MCV 85.7  MCH 26.7  MCHC 31.1  RDW 16.6*  PLT 212    Cardiac EnzymesNo results for input(s): TROPONINI in the last 168 hours. No results for input(s): TROPIPOC in the last 168 hours.   BNP Recent Labs Lab 01/07/17 1705  BNP 653.3*     DDimer No results for input(s): DDIMER in the last 168 hours.   Radiology    Dg Chest Port 1 View  Result Date: 01/08/2017 CLINICAL DATA:  Shortness of breath. EXAM: PORTABLE CHEST 1 VIEW COMPARISON:  01/03/2017. FINDINGS: Prior CABG. Stable cardiomegaly. Interim improvement of pulmonary interstitial prominence suggesting clearing of CHF. Tiny right pleural effusion again noted. No pneumothorax. IMPRESSION: 1. Prior CABG.  Stable cardiomegaly. 2. Interim clearing of pulmonary interstitial prominence suggesting clearing CHF. Tiny right pleural effusion again noted. Electronically Signed   By: Marcello Moores  Register   On: 01/08/2017 07:25    Cardiac Studies   Echo pending  Last echo 05/31/2016 Study Conclusions  - Procedure narrative: Transthoracic echocardiography. Image   quality was suboptimal. The study was technically difficult.   Intravenous contrast (Definity) was administered. - Left ventricle: The cavity  size was normal. Wall thickness was   increased in a pattern of moderate LVH. Systolic function was   normal. The estimated ejection fraction was in the range of 55%   to 60%. Doppler parameters are consistent with a reversible   restrictive pattern, indicative of decreased left ventricular   diastolic compliance and/or increased left atrial pressure (grade   3 diastolic dysfunction). Doppler parameters are consistent with   high ventricular filling pressure. - Aortic valve: AV is thickened, calcified with restricted motion   Difficult to see well Peak and mean gradients through the valve   are 41 and 28 mm Hg respectively consistent with moderate AS - Mitral valve: Calcified annulus. Mildly thickened leaflets . - Left atrium: The atrium was mildly dilated.  Impressions:  - COmpared to report of echo from 2016 there is no significant   change.   Patient Profile     83 y.o. male with history of chronic diastolic CHF, moderate aortic valve stenosis, hypertensive heart disease, HLD, CAD s/p 4V CABG March 2007 who is being admitted today secondary to volume overload. He is known to have chronic diastolic CHF with grade 3 diastolic dysfunction noted on echo in 2017. He has moderate AS. Cardiac cath January 2018 with 4 patent bypass grafts and elevated filling pressures.   Assessment & Plan    Acute on chronic diastolic heart failure -Last echo 05/31/16 showed LV EF 55-60%, grade 3 DD -Volume overloaded with progressive dyspnea, weight gain and LE edema despite outpatient diuretics.  -Admitted yesterday for IV diuresis.  -BNP 5/15 653.3 -CXR today showed stable cardiomegaly, findings suggestive of clearing CHF. -Lasix 60 mg IV BID. Down 4+ pounds (11 pounds total ?difference in scales). 3.4L urine output so far. Net negative 2.7L I&O.  -Breathing and edema improved. Pt walking in the halls without [place] to continue IV diuresis and reassess in the morning. Pt may benefit from  changing to torsemide at home (better absorption) as lasix, even 80 mg, was ineffective.  -K+ 3.0, SCr stable at 0.82. Will supplement K+ and continue to monitor.  -Low sodium diet. Had long discussion on sodium intake and how to avoid in the diet.   CAD s/p CABG -No chest pain -4 patent graft by cath 08/2016 -continue aspirin, statin, beta blocker and Imdur  Aortic  valve stenosis -Moderate by echo 05/2016. Low gradient by cath 08/2015. May be playing some role in current volume overload.  -Echo pending  Hypertension -BP well controlled (Cozaar 25 mg, metoprolol 50 mg bid, Imdur 30 mg daily)  Hyperlipidemia -Continue Lipitor 20 mg  Hypokalemia -K+ 3.0. Currently on K-Dur 20 mEq daily. Will increase to 20 mEq BID, and dose now  Signed, Daune Perch, NP  01/08/2017, 3:49 PM    I have seen and examined the patient along with Daune Perch, NP.  I have reviewed the chart, notes and new data.  I agree with PA/NP's note.  Key new complaints: much improved. Key examination changes: no overt hypervolemia, but obesity limits exam. Closeto baseline weight in January 2018 Key new findings / data: K low, replacing. Creat and BUN normal  PLAN: Switch to PO diuretics. Probable DC 24h or so.  Sanda Klein, MD, [place] [phone] 01/08/2017, 5:01 PM

## 2017-01-08 NOTE — Consult Note (Signed)
   [facility] CM Inpatient Consult   01/08/2017  Justin [name] [birth year] [id number]  Patient evaluated for Care Management needs. Patient is an 83 year old with HX of chronic HF, admitted now with volume overload.  Patient with [employer]. Patient's primary care provider is Dr. [name], [place].  This office provides post hospital transition of care calls. Met with the patient and his wife.  Patient states he weighs, eats right, and sees his primary. Patient states he gets calls from his primary care provider and knows about the automated phone calls as well. No post hospital care management needs identified at this time.  For questions, please contact:  Natividad Brood, RN BSN [facility] Liaison  [phone] business mobile phone Toll free office [phone]

## 2017-01-08 NOTE — Progress Notes (Signed)
Patient is using "breathe right" nasal strips, requests to take a shower  in the morning.

## 2017-01-09 ENCOUNTER — Inpatient Hospital Stay (HOSPITAL_COMMUNITY): Payer: Medicare Other

## 2017-01-09 DIAGNOSIS — I509 Heart failure, unspecified: Secondary | ICD-10-CM

## 2017-01-09 DIAGNOSIS — I2510 Atherosclerotic heart disease of native coronary artery without angina pectoris: Secondary | ICD-10-CM

## 2017-01-09 DIAGNOSIS — I350 Nonrheumatic aortic (valve) stenosis: Secondary | ICD-10-CM

## 2017-01-09 LAB — BASIC METABOLIC PANEL
Anion gap: 11 (ref 5–15)
BUN: 11 mg/dL (ref 6–20)
CALCIUM: 8 mg/dL — AB (ref 8.9–10.3)
CHLORIDE: 98 mmol/L — AB (ref 101–111)
CO2: 29 mmol/L (ref 22–32)
Creatinine, Ser: 0.84 mg/dL (ref 0.61–1.24)
Glucose, Bld: 88 mg/dL (ref 65–99)
Potassium: 3 mmol/L — ABNORMAL LOW (ref 3.5–5.1)
SODIUM: 138 mmol/L (ref 135–145)

## 2017-01-09 LAB — ECHOCARDIOGRAM COMPLETE
AO mean calculated velocity dopler: 205 cm/s
AOVTI: 74.7 cm
AV Area VTI index: 0.31 cm2/m2
AV Area VTI: 0.77 cm2
AV Area mean vel: 0.75 cm2
AV Peak grad: 33 mmHg
AV VEL mean LVOT/AV: 0.24
AV vel: 0.77
AVA: 0.77 cm2
AVAREAMEANVIN: 0.3 cm2/m2
AVG: 20 mmHg
AVPKVEL: 288 cm/s
Ao pk vel: 0.24 m/s
Area-P 1/2: 3.73 cm2
CHL CUP AV PEAK INDEX: 0.31
CHL CUP MV DEC (S): 201
CHL CUP RV SYS PRESS: 20 mmHg
EERAT: 17.59
EWDT: 201 ms
FS: 39 % (ref 28–44)
Height: 67 in
IV/PV OW: 1.08
LADIAMINDEX: 1.77 cm/m2
LASIZE: 44 mm
LAVOL: 97.4 mL
LAVOLA4C: 82.3 mL
LAVOLIN: 39.1 mL/m2
LDCA: 3.14 cm2
LEFT ATRIUM END SYS DIAM: 44 mm
LV TDI E'LATERAL: 7.05
LV dias vol index: 51 mL/m2
LV dias vol: 126 mL (ref 62–150)
LV sys vol: 55 mL (ref 21–61)
LVEEAVG: 17.59
LVEEMED: 17.59
LVELAT: 7.05 cm/s
LVOT SV: 58 mL
LVOT VTI: 18.4 cm
LVOT diameter: 20 mm
LVOT peak grad rest: 2 mmHg
LVOTPV: 70.2 cm/s
LVOTVTI: 0.25 cm
LVSYSVOLIN: 22 mL/m2
Lateral S' vel: 10.4 cm/s
MV Peak grad: 6 mmHg
MV pk A vel: 34.4 m/s
MVPKEVEL: 124 m/s
P 1/2 time: 59 ms
P 1/2 time: 628 ms
PW: 12 mm — AB (ref 0.6–1.1)
Reg peak vel: 176 cm/s
Simpson's disk: 57
Stroke v: 72 ml
TAPSE: 20.9 mm
TDI e' medial: 3.73
TRMAXVEL: 176 cm/s
Valve area index: 0.31
WEIGHTICAEL: 4398.4 [oz_av]

## 2017-01-09 MED ORDER — PERFLUTREN LIPID MICROSPHERE
1.0000 mL | INTRAVENOUS | Status: AC | PRN
  Administered 2017-01-09: 1 mL via INTRAVENOUS
  Filled 2017-01-09: qty 10

## 2017-01-09 MED ORDER — POTASSIUM CHLORIDE CRYS ER 20 MEQ PO TBCR
40.0000 meq | EXTENDED_RELEASE_TABLET | Freq: Once | ORAL | Status: AC
  Administered 2017-01-09: 40 meq via ORAL
  Filled 2017-01-09: qty 2

## 2017-01-09 MED ORDER — PERFLUTREN LIPID MICROSPHERE
INTRAVENOUS | Status: AC
  Administered 2017-01-09: 1 mL via INTRAVENOUS
  Filled 2017-01-09: qty 10

## 2017-01-09 MED ORDER — POTASSIUM CHLORIDE CRYS ER 20 MEQ PO TBCR
40.0000 meq | EXTENDED_RELEASE_TABLET | Freq: Two times a day (BID) | ORAL | Status: DC
  Administered 2017-01-09 – 2017-01-10 (×3): 40 meq via ORAL
  Filled 2017-01-09 (×3): qty 2

## 2017-01-09 NOTE — Progress Notes (Signed)
The echo report raises concern for possible low output severe AS. I have personally reviewed the study. In my opinion, the AS is clearly moderate, not severe.  The AS jet is early peaking. The Aortic ejection CW envelope that was chosen for calculation of gradients was a post ectopic beat with higher than average stroke volume. The LVOT diameter was markedly undermeasured (by about 20%). In my measurement the LVOT is 24 mm, the AV area calculates to approximately 1 cm sq, even using the exaggerated velocities of the postectopic beat. The leaflets are indeed restricted in motion, but a clear opening can be seen and measured by planimetry in short axis, 1.3 cm sq. Aortic valve area was 1.6 cm sq by Gorlin equation at cath in January 2018.  Repeat assessment of the AV in the near future would be of value. I would definitely avoid dobutamine echo at this time, since the patient has frequent ectopy due to diuretic-related hypokalemia.  The echo does show clear evidence of elevated mean left atrial pressure, even after diuresis.  Sanda Klein, MD, [facility] [phone] office [phone] pager

## 2017-01-09 NOTE — Progress Notes (Signed)
Patient slept well, VS stable. No concerns or complaints.

## 2017-01-09 NOTE — Progress Notes (Signed)
Progress Note  Patient Name: Justin [name] Date of Encounter: 01/09/2017  Primary Cardiologist: Dr. Angelena Form  Subjective   Patient is feeling much better. Breathing and edema is much improved. He has been up walking in the halls with no DOE. No orthopnea.  Inpatient Medications    Scheduled Meds: . perflutren lipid microspheres (DEFINITY) IV suspension      . aspirin EC  81 mg Oral QPM  . atorvastatin  20 mg Oral Daily  . enoxaparin (LOVENOX) injection  40 mg Subcutaneous Q24H  . famotidine  20 mg Oral Daily  . furosemide  80 mg Oral BID  . ipratropium  2 spray Each Nare Q12H  . isosorbide mononitrate  30 mg Oral Daily  . levothyroxine  75 mcg Oral QAC breakfast  . loratadine  10 mg Oral Daily  . losartan  25 mg Oral Daily  . metoprolol tartrate  50 mg Oral BID  . mometasone-formoterol  2 puff Inhalation BID  . montelukast  10 mg Oral QPM  . potassium chloride SA  20 mEq Oral BID  . potassium chloride  40 mEq Oral BID  . potassium chloride  40 mEq Oral Once  . saccharomyces boulardii  250 mg Oral Daily  . sodium chloride flush  3 mL Intravenous Q12H   Continuous Infusions: . sodium chloride     PRN Meds: sodium chloride, acetaminophen, ondansetron (ZOFRAN) IV, sodium chloride flush   Vital Signs    Vitals:   01/08/17 2002 01/08/17 2235 01/09/17 0043 01/09/17 0520  BP: (!) 107/50 134/68 (!) 100/57 (!) 120/51  Pulse: 68 68 61 66  Resp: (!) 22  18 18   Temp: 97.6 F (36.4 C)  98.2 F (36.8 C) 97.8 F (36.6 C)  TempSrc: Oral  Oral Oral  SpO2: 98%  90% 93%  Weight:    274 lb 14.4 oz (124.7 kg)  Height:        Intake/Output Summary (Last 24 hours) at 01/09/17 1026 Last data filed at 01/09/17 0939  Gross per 24 hour  Intake              843 ml  Output             3850 ml  Net            -3007 ml   Filed Weights   01/07/17 1642 01/08/17 0552 01/09/17 0520  Weight: 282 lb 1.6 oz (128 kg) 278 lb 11.2 oz (126.4 kg) 274 lb 14.4 oz (124.7 kg)    Telemetry     Sinus rhythm in the 60's with PVC's. - Personally Reviewed  ECG    No new tracings 01/07/17 Sinus rhythm/1st degree AV block,  63 bpm, PVC's, non-specific ST/T changes laterally. - Personally Reviewed  Physical Exam   GEN: Obese caucasian male. No acute distress.   Neck: No JVD Cardiac: RRR, rubs, or gallops. 2/6 systolic murmur in RUSB Respiratory: Clear to auscultation bilaterally. GI: Soft, nontender, non-distended  MS: 1+ lower leg edema; No deformity. Neuro:  Nonfocal  Psych: Normal affect   Labs    Chemistry  Recent Labs Lab 01/07/17 1705 01/08/17 0537 01/09/17 0358  NA 139 139 138  K 3.1* 3.0* 3.0*  CL 103 103 98*  CO2 29 27 29   GLUCOSE 87 104* 88  BUN 16 14 11   CREATININE 0.86 0.82 0.84  CALCIUM 8.1* 8.0* 8.0*  PROT 6.4*  --   --   ALBUMIN 3.0*  --   --  AST 29  --   --   ALT 27  --   --   ALKPHOS 69  --   --   BILITOT 0.9  --   --   GFRNONAA >60 >60 >60  GFRAA >60 >60 >60  ANIONGAP 7 9 11      Hematology  Recent Labs Lab 01/07/17 1705  WBC 11.6*  RBC 4.84  HGB 12.9*  HCT 41.5  MCV 85.7  MCH 26.7  MCHC 31.1  RDW 16.6*  PLT 212    Cardiac EnzymesNo results for input(s): TROPONINI in the last 168 hours. No results for input(s): TROPIPOC in the last 168 hours.   BNP  Recent Labs Lab 01/07/17 1705  BNP 653.3*     DDimer No results for input(s): DDIMER in the last 168 hours.   Radiology    Dg Chest Port 1 View  Result Date: 01/08/2017 CLINICAL DATA:  Shortness of breath. EXAM: PORTABLE CHEST 1 VIEW COMPARISON:  01/03/2017. FINDINGS: Prior CABG. Stable cardiomegaly. Interim improvement of pulmonary interstitial prominence suggesting clearing of CHF. Tiny right pleural effusion again noted. No pneumothorax. IMPRESSION: 1. Prior CABG.  Stable cardiomegaly. 2. Interim clearing of pulmonary interstitial prominence suggesting clearing CHF. Tiny right pleural effusion again noted. Electronically Signed   By: Marcello Moores  Register   On:  01/08/2017 07:25    Cardiac Studies   Echo pending  Last echo 05/31/2016 Study Conclusions  - Procedure narrative: Transthoracic echocardiography. Image   quality was suboptimal. The study was technically difficult.   Intravenous contrast (Definity) was administered. - Left ventricle: The cavity size was normal. Wall thickness was   increased in a pattern of moderate LVH. Systolic function was   normal. The estimated ejection fraction was in the range of 55%   to 60%. Doppler parameters are consistent with a reversible   restrictive pattern, indicative of decreased left ventricular   diastolic compliance and/or increased left atrial pressure (grade   3 diastolic dysfunction). Doppler parameters are consistent with   high ventricular filling pressure. - Aortic valve: AV is thickened, calcified with restricted motion   Difficult to see well Peak and mean gradients through the valve   are 41 and 28 mm Hg respectively consistent with moderate AS - Mitral valve: Calcified annulus. Mildly thickened leaflets . - Left atrium: The atrium was mildly dilated.  Impressions:  - COmpared to report of echo from 2016 there is no significant   change.   Patient Profile     83 y.o. male with history of chronic diastolic CHF, moderate aortic valve stenosis, hypertensive heart disease, HLD, CAD s/p 4V CABG March 2007 who is being admitted today secondary to volume overload. He is known to have chronic diastolic CHF with grade 3 diastolic dysfunction noted on echo in 2017. He has moderate AS. Cardiac cath January 2018 with 4 patent bypass grafts and elevated filling pressures.   Assessment & Plan    Acute on chronic diastolic heart failure -Last echo 05/31/16 showed LV EF 55-60%, grade 3 DD -Volume overloaded with progressive dyspnea, weight gain and LE edema despite outpatient diuretics.  -Admitted 5/15 for IV diuresis.  -BNP 5/15 653.3 -CXR today showed stable cardiomegaly, findings  suggestive of clearing CHF. -Breathing and edema improved. Pt walking in the halls without DOE -Pt had good response to IV lasix. Changed to oral dosing this am.  -Wt down 4+ pounds overnight- now 274 lbs (15 pounds total since admission ?difference in scales). 3.8L urine output yesterday. Net negative  4.3 L I&O since admission.  -K+ 3.0, SCr stable at 0.84. Will supplement K+ and continue to monitor.  -Low sodium diet. Had long discussion on sodium intake and how to avoid in the diet.  -Plan to watch on oral lasix and possible discharge tomorrow.  CAD s/p CABG -No chest pain -4 patent graft by cath 08/2016 -continue aspirin, statin, beta blocker and Imdur  Aortic valve stenosis -Moderate by echo 05/2016. Low gradient by cath 08/2015. May be playing some role in current volume overload.  -Echo pending  Hypertension -BP well controlled (Cozaar 25 mg, metoprolol 50 mg bid, Imdur 30 mg daily)  Hyperlipidemia -Continue Lipitor 20 mg  Hypokalemia -K+ 3.0. Will give KDur 40 mEq TID today.  Signed, Daune Perch, NP  01/09/2017, 10:26 AM    I have seen and examined the patient along with Daune Perch, NP.  I have reviewed the chart, notes and new data.  I agree with NP's note.  Key new complaints: feels much better Key examination changes: edema virtually resolved, no JVD, no S3, frequent ectopy Key new findings / data: bedside review of echo still shows evidence of high mean left atrial pressure. AS remains moderate.  PLAN: Replete K. High dose PO furosemide. Watch at least 24 hours to ensure he still has a net negative fluid balance on PO diuretics. Recheck K in AM. Possible DC tomorrow, with early office follow up. Reviewed sodium restriction and daily weights.  Sanda Klein, MD, [place] [phone] 01/09/2017, 11:45 AM

## 2017-01-09 NOTE — Progress Notes (Signed)
*  PRELIMINARY RESULTS* Echocardiogram 2D Echocardiogram has been performed with Definity.  Samuel Germany 01/09/2017, 11:20 AM

## 2017-01-10 ENCOUNTER — Telehealth: Payer: Self-pay | Admitting: Cardiovascular Disease

## 2017-01-10 ENCOUNTER — Other Ambulatory Visit: Payer: Self-pay | Admitting: Cardiology

## 2017-01-10 DIAGNOSIS — I350 Nonrheumatic aortic (valve) stenosis: Secondary | ICD-10-CM

## 2017-01-10 DIAGNOSIS — E876 Hypokalemia: Secondary | ICD-10-CM

## 2017-01-10 DIAGNOSIS — I5022 Chronic systolic (congestive) heart failure: Secondary | ICD-10-CM

## 2017-01-10 DIAGNOSIS — I5043 Acute on chronic combined systolic (congestive) and diastolic (congestive) heart failure: Secondary | ICD-10-CM

## 2017-01-10 DIAGNOSIS — I5020 Unspecified systolic (congestive) heart failure: Secondary | ICD-10-CM

## 2017-01-10 LAB — BASIC METABOLIC PANEL
Anion gap: 8 (ref 5–15)
BUN: 14 mg/dL (ref 6–20)
CALCIUM: 8.5 mg/dL — AB (ref 8.9–10.3)
CHLORIDE: 103 mmol/L (ref 101–111)
CO2: 28 mmol/L (ref 22–32)
CREATININE: 0.86 mg/dL (ref 0.61–1.24)
GFR calc non Af Amer: >60 mL/min (ref >60)
Glucose, Bld: 102 mg/dL — ABNORMAL HIGH (ref 65–99)
Potassium: 3.9 mmol/L (ref 3.5–5.1)
Sodium: 139 mmol/L (ref 135–145)

## 2017-01-10 MED ORDER — POTASSIUM CHLORIDE CRYS ER 20 MEQ PO TBCR: 40.0000 meq | EXTENDED_RELEASE_TABLET | Freq: Every day | ORAL | 2 refills | Status: DC

## 2017-01-10 MED ORDER — FUROSEMIDE 80 MG PO TABS: 80.0000 mg | ORAL_TABLET | Freq: Every day | ORAL | 2 refills | Status: DC

## 2017-01-10 MED ORDER — LOSARTAN POTASSIUM 25 MG PO TABS: 25.0000 mg | ORAL_TABLET | Freq: Every day | ORAL | 5 refills | Status: DC

## 2017-01-10 NOTE — Discharge Summary (Signed)
Discharge Summary    Patient ID: Justin [name],  MRN: [id number], DOB/AGE: May 17, 1934 83 y.o.  Admit date: 01/07/2017 Discharge date: 01/10/2017  Primary Care Provider: [name] A Primary Cardiologist: Dr. Angelena Form  Discharge Diagnoses    Active Problems:   Acute on chronic diastolic CHF (congestive heart failure) (HCC)   Hypokalemia due to loss of potassium with diuresis   Aortic stenosis   Systolic heart failure (HCC)   Allergies Allergies  Allergen Reactions  . Amlodipine Other (See Comments)    Swelling in ankles  . Peanut-Containing Drug Products Anaphylaxis  . Sulfonamide Derivatives Anaphylaxis  . Lisinopril Other (See Comments)    cough  . Prednisone Other (See Comments)    Constipation & "he could not sleep" **Can tolerate in certain situations**    Diagnostic Studies/Procedures    Echocardiogram 01/09/2017 Study Conclusions  - Left ventricle: The cavity size was normal. There was moderate   concentric hypertrophy. Systolic function was mildly to   moderately reduced. The estimated ejection fraction was in the   range of 40% to 45%. Wall motion was normal; there were no   regional wall motion abnormalities. Features are consistent with   a pseudonormal left ventricular filling pattern, with concomitant   abnormal relaxation and increased filling pressure (grade 2   diastolic dysfunction). Doppler parameters are consistent with   elevated ventricular end-diastolic filling pressure. - Ventricular septum: Septal motion showed paradox. - Aortic valve: Valve mobility was restricted. There was moderate   to severe stenosis. There was mild regurgitation. Mean gradient   (S): 27 mm Hg. Peak gradient (S): 58 mm Hg. Valve area (VTI):   0.63 cm^2. Valve area (Vmax): 0.57 cm^2. Valve area (Vmean): 0.66   cm^2. - Mitral valve: Calcified annulus. Mildly thickened leaflets . - Left atrium: The atrium was mildly dilated. - Right ventricle: The cavity size  was mildly dilated. Wall   thickness was normal. Systolic function was moderately reduced.  Impressions:  - When compared to th eprior study from 05/31/2016 LVEF has   decreased from 55-60% to 40-45%.   Aortic valve is severely calcified and thickened with severely   restricted leaflet openings. Transaortic gradient consistent with   moderate stenosis, however AVA consistent with severe aortic   stenosis. Consider a low Dobutamine stress echocardiogram for   further evaluation.  _____________   History of Present Illness     Demoni Gergen Is an 83 year old male with history of chronic diastolic CHF, moderate aortic valve stenosis, hypertensive heart disease, hyperlipidemia, CAD S/P 4V CABG March 2007 who is admitted on 01/07/17 with volume overload with symptoms of progressive dyspnea, weight gain and lower extremity edema despite outpatient diuretics. He is known to have chronic diastolic CHF with grade 3 diastolic dysfunction noted on echo in 2017. He also has moderate aortic stenosis. Cardiac cath in January 2018 showed 4 patent bypass grafts and elevated filling pressures.  Hospital Course     Consultants: None  The patient was placed on IV Lasix 60 mg twice a day with excellent response. His weight is down 15 pounds since admission and breathing and edema have greatly improved. He no longer has orthopnea. He still has mild lower leg edema. He has since been switched to 80 mg orally twice a day and will be sent home on 80 mg daily. The patient developed hypokalemia with a potassium of 3.0. He was supplemented and today his potassium level is 3.9. We'll discharge him on potassium 40 mEq  daily and repeat metabolic panel on Tuesday.  I have had extensive discussion with patient on low-sodium/heart healthy diet. We have also discussed heart failure monitoring including daily weights, edema monitoring, and breathing. He is to notify the office of increase in edema, dyspnea, orthopnea, and  weight of more than 3 pounds in a day or 5 pounds in a week. He verbalizes understanding.  Repeat echocardiogram on 01/09/17 showed moderate possibly low output aortic stenosis and a decrease in EF from 55-60% in 2017 to 40-45% with grade 2 diastolic dysfunction. No regional wall motion abnormalities were noted. Dr.Croitoru advises repeat assessment of the aortic valve in the near future and avoidance of dobutamine echo at this time since the patient is having frequent PVCs in the setting of diuretic related hypokalemia.  Patient has been seen by Dr. Sallyanne Kuster today and deemed ready for discharge home. All follow up appointments have been scheduled. Discharge medications are listed below. _____________  Discharge Vitals Blood pressure (!) 127/57, pulse 69, temperature 97.8 F (36.6 C), temperature source Oral, resp. rate 20, height 5\' 7"  (1.702 m), weight 274 lb (124.3 kg), SpO2 96 %.  Filed Weights   01/08/17 0552 01/09/17 0520 01/10/17 0513  Weight: 278 lb 11.2 oz (126.4 kg) 274 lb 14.4 oz (124.7 kg) 274 lb (124.3 kg)    Labs & Radiologic Studies    CBC  Recent Labs  01/07/17 1705  WBC 11.6*  NEUTROABS 7.4  HGB 12.9*  HCT 41.5  MCV 85.7  PLT 762   Basic Metabolic Panel  Recent Labs  01/07/17 1705  01/09/17 0358 01/10/17 0553  NA 139  < > 138 139  K 3.1*  < > 3.0* 3.9  CL 103  < > 98* 103  CO2 29  < > 29 28  GLUCOSE 87  < > 88 102*  BUN 16  < > 11 14  CREATININE 0.86  < > 0.84 0.86  CALCIUM 8.1*  < > 8.0* 8.5*  MG 2.0  --   --   --   < > = values in this interval not displayed. Liver Function Tests  Recent Labs  01/07/17 1705  AST 29  ALT 27  ALKPHOS 69  BILITOT 0.9  PROT 6.4*  ALBUMIN 3.0*   No results for input(s): LIPASE, AMYLASE in the last 72 hours. Cardiac Enzymes No results for input(s): CKTOTAL, CKMB, CKMBINDEX, TROPONINI in the last 72 hours. BNP Invalid input(s): POCBNP D-Dimer No results for input(s): DDIMER in the last 72 hours. Hemoglobin  A1C No results for input(s): HGBA1C in the last 72 hours. Fasting Lipid Panel No results for input(s): CHOL, HDL, LDLCALC, TRIG, CHOLHDL, LDLDIRECT in the last 72 hours. Thyroid Function Tests No results for input(s): TSH, T4TOTAL, T3FREE, THYROIDAB in the last 72 hours.  Invalid input(s): FREET3 _____________  Dg Chest 2 View  Result Date: 01/03/2017 CLINICAL DATA:  83 year old male with history of chronic shortness of breath. EXAM: CHEST  2 VIEW COMPARISON:  Chest x-ray 01/05/2016. FINDINGS: Diffuse peribronchial cuffing. Lung volumes are low. No consolidative airspace disease. Small right pleural effusion. No left pleural effusion. No pneumothorax. No pulmonary nodule or mass noted. Pulmonary vasculature is normal. Heart size is mildly enlarged. Upper mediastinal contours are within normal limits. Aortic atherosclerosis. IMPRESSION: 1. Small right-sided pleural effusion. 2. Diffuse peribronchial cuffing, concerning for an acute bronchitis. 3. Mild cardiomegaly (unchanged). 4. Aortic atherosclerosis. Electronically Signed   By: Vinnie Langton M.D.   On: 01/03/2017 10:38   Dg Chest  Port 1 View  Result Date: 01/08/2017 CLINICAL DATA:  Shortness of breath. EXAM: PORTABLE CHEST 1 VIEW COMPARISON:  01/03/2017. FINDINGS: Prior CABG. Stable cardiomegaly. Interim improvement of pulmonary interstitial prominence suggesting clearing of CHF. Tiny right pleural effusion again noted. No pneumothorax. IMPRESSION: 1. Prior CABG.  Stable cardiomegaly. 2. Interim clearing of pulmonary interstitial prominence suggesting clearing CHF. Tiny right pleural effusion again noted. Electronically Signed   By: Marcello Moores  Register   On: 01/08/2017 07:25   Disposition   Pt is being discharged home today in good condition.  Follow-up Plans & Appointments    Follow-up Information    [facility] Follow up on 01/14/2017.   Specialty:  Cardiology Why:  Go to the cardiology office on Tuesday 5/22 to have  labs drawn after 7:30 am.  Contact information: [address] [place] [phone]       Burnell Blanks, MD Follow up on 01/15/2017.   Specialty:  Cardiology Why:  at 2:00 for cardiology hospital follow up.  Contact information: [address] [place] [address] [phone]          Discharge Instructions    (HEART FAILURE PATIENTS) Call MD:  Anytime you have any of the following symptoms: 1) 3 pound weight gain in 24 hours or 5 pounds in 1 week 2) shortness of breath, with or without a dry hacking cough 3) swelling in the hands, feet or stomach 4) if you have to sleep on extra pillows at night in order to breathe.    Complete by:  As directed    Avoid straining    Complete by:  As directed    Diet - low sodium heart healthy    Complete by:  As directed    Heart Failure patients record your daily weight using the same scale at the same time of day    Complete by:  As directed    Increase activity slowly    Complete by:  As directed    STOP any activity that causes chest pain, shortness of breath, dizziness, sweating, or exessive weakness    Complete by:  As directed       Discharge Medications   Current Discharge Medication List    START taking these medications   Details  losartan (COZAAR) 25 MG tablet Take 1 tablet (25 mg total) by mouth daily. Qty: 30 tablet, Refills: 5      CONTINUE these medications which have CHANGED   Details  furosemide (LASIX) 80 MG tablet Take 1 tablet (80 mg total) by mouth daily. Qty: 30 tablet, Refills: 2    potassium chloride SA (K-DUR,KLOR-CON) 20 MEQ tablet Take 2 tablets (40 mEq total) by mouth daily. Qty: 30 tablet, Refills: 2      CONTINUE these medications which have NOT CHANGED   Details  aspirin 81 MG tablet Take 81 mg by mouth every evening.     atorvastatin (LIPITOR) 20 MG tablet Take 20 mg by mouth daily.    Azelastine-Fluticasone (DYMISTA) 137-50  MCG/ACT SUSP Place 2 puffs into the nose 2 (two) times daily. Qty: 1 Bottle, Refills: 6    budesonide-formoterol (SYMBICORT) 80-4.5 MCG/ACT inhaler Inhale 2 puffs into the lungs 2 (two) times daily. Qty: 1 Inhaler, Refills: 5    ipratropium (ATROVENT) 0.03 % nasal spray Place 2 sprays into both nostrils every 12 (twelve) hours.    isosorbide mononitrate (IMDUR) 30 MG 24 hr tablet Take 1 tablet (  30 mg total) by mouth daily. Qty: 90 tablet, Refills: 3    levothyroxine (SYNTHROID, LEVOTHROID) 75 MCG tablet Take 1 tablet (75 mcg total) by mouth daily. Qty: 90 tablet, Refills: 3    loratadine (CLARITIN) 10 MG tablet Take 10 mg by mouth daily.    metoprolol (LOPRESSOR) 50 MG tablet Take 1 tablet (50 mg total) by mouth 2 (two) times daily. Qty: 180 tablet, Refills: 2    montelukast (SINGULAIR) 10 MG tablet TAKE 1 TABLET IN THE EVENING. Qty: 60 tablet, Refills: 5    Multiple Vitamins-Minerals (PRESERVISION AREDS PO) Take 2 tablets by mouth every morning.    Probiotic Product (ALIGN) 4 MG CAPS Take 4 mg by mouth daily.     ranitidine (ZANTAC) 150 MG capsule Take 150 mg by mouth 2 (two) times daily.    Simethicone (GAS-X EXTRA STRENGTH) 125 MG CAPS Take 125 mg by mouth daily.      STOP taking these medications     diclofenac (VOLTAREN) 75 MG EC tablet          Outstanding Labs/Studies   Needs BMet on Tuesday for follow up potassium and renal function  Duration of Discharge Encounter   Greater than 30 minutes including physician time.  Signed, Daune Perch NP 01/10/2017, 1:06 PM

## 2017-01-10 NOTE — Progress Notes (Signed)
Patient stable, no complaints or concerns from patient. 

## 2017-01-10 NOTE — Progress Notes (Signed)
Patient discharged with wife. Pt refused to weight for wheelchair.

## 2017-01-10 NOTE — Progress Notes (Signed)
Progress Note  Patient Name: Justin [name] Date of Encounter: 01/10/2017  Primary Cardiologist: Dr. Angelena Form  Subjective   Patient is feeling much better. Breathing and edema is much improved. He has been up walking in the halls with no DOE. No orthopnea.  Inpatient Medications    Scheduled Meds: . aspirin EC  81 mg Oral QPM  . atorvastatin  20 mg Oral Daily  . enoxaparin (LOVENOX) injection  40 mg Subcutaneous Q24H  . famotidine  20 mg Oral Daily  . furosemide  80 mg Oral BID  . ipratropium  2 spray Each Nare Q12H  . isosorbide mononitrate  30 mg Oral Daily  . levothyroxine  75 mcg Oral QAC breakfast  . loratadine  10 mg Oral Daily  . losartan  25 mg Oral Daily  . metoprolol tartrate  50 mg Oral BID  . mometasone-formoterol  2 puff Inhalation BID  . montelukast  10 mg Oral QPM  . potassium chloride  40 mEq Oral BID  . saccharomyces boulardii  250 mg Oral Daily  . sodium chloride flush  3 mL Intravenous Q12H   Continuous Infusions: . sodium chloride     PRN Meds: sodium chloride, acetaminophen, ondansetron (ZOFRAN) IV, sodium chloride flush   Vital Signs    Vitals:   01/09/17 2106 01/10/17 0507 01/10/17 0513 01/10/17 0809  BP: (!) 126/49 (!) 108/52    Pulse: 64 61  65  Resp: 18 17  18   Temp: 97.4 F (36.3 C) 97.9 F (36.6 C)    TempSrc: Oral Oral    SpO2: 96% 97%  97%  Weight:   274 lb (124.3 kg)   Height:        Intake/Output Summary (Last 24 hours) at 01/10/17 0934 Last data filed at 01/10/17 0800  Gross per 24 hour  Intake             1203 ml  Output             2350 ml  Net            -1147 ml   Filed Weights   01/08/17 0552 01/09/17 0520 01/10/17 0513  Weight: 278 lb 11.2 oz (126.4 kg) 274 lb 14.4 oz (124.7 kg) 274 lb (124.3 kg)    Telemetry    Sinus rhythm in the 60's with PVC's (slightly less frequent PVC's). - Personally Reviewed  ECG    No new tracings 01/07/17 Sinus rhythm/1st degree AV block,  63 bpm, PVC's, non-specific ST/T  changes laterally. - Personally Reviewed  Physical Exam   GEN: Obese caucasian male. No acute distress.   Neck: No JVD Cardiac: RRR, rubs, or gallops. 2/6 systolic murmur in RUSB Respiratory: Clear to auscultation bilaterally. GI: Soft, nontender, non-distended  MS: 1+ lower leg edema; No deformity. Neuro:  Nonfocal  Psych: Normal affect   Labs    Chemistry  Recent Labs Lab 01/07/17 1705 01/08/17 0537 01/09/17 0358 01/10/17 0553  NA 139 139 138 139  K 3.1* 3.0* 3.0* 3.9  CL 103 103 98* 103  CO2 29 27 29 28   GLUCOSE 87 104* 88 102*  BUN 16 14 11 14   CREATININE 0.86 0.82 0.84 0.86  CALCIUM 8.1* 8.0* 8.0* 8.5*  PROT 6.4*  --   --   --   ALBUMIN 3.0*  --   --   --   AST 29  --   --   --   ALT 27  --   --   --  ALKPHOS 69  --   --   --   BILITOT 0.9  --   --   --   GFRNONAA >60 >60 >60 >60  GFRAA >60 >60 >60 >60  ANIONGAP 7 9 11 8      Hematology  Recent Labs Lab 01/07/17 1705  WBC 11.6*  RBC 4.84  HGB 12.9*  HCT 41.5  MCV 85.7  MCH 26.7  MCHC 31.1  RDW 16.6*  PLT 212    Cardiac EnzymesNo results for input(s): TROPONINI in the last 168 hours. No results for input(s): TROPIPOC in the last 168 hours.   BNP  Recent Labs Lab 01/07/17 1705  BNP 653.3*     DDimer No results for input(s): DDIMER in the last 168 hours.   Radiology    No results found.  Cardiac Studies   Echo pending  Last echo 05/31/2016 Study Conclusions  - Procedure narrative: Transthoracic echocardiography. Image   quality was suboptimal. The study was technically difficult.   Intravenous contrast (Definity) was administered. - Left ventricle: The cavity size was normal. Wall thickness was   increased in a pattern of moderate LVH. Systolic function was   normal. The estimated ejection fraction was in the range of 55%   to 60%. Doppler parameters are consistent with a reversible   restrictive pattern, indicative of decreased left ventricular   diastolic compliance and/or  increased left atrial pressure (grade   3 diastolic dysfunction). Doppler parameters are consistent with   high ventricular filling pressure. - Aortic valve: AV is thickened, calcified with restricted motion   Difficult to see well Peak and mean gradients through the valve   are 41 and 28 mm Hg respectively consistent with moderate AS - Mitral valve: Calcified annulus. Mildly thickened leaflets . - Left atrium: The atrium was mildly dilated.  Impressions:  - COmpared to report of echo from 2016 there is no significant   change.   Patient Profile     83 y.o. male with history of chronic diastolic CHF, moderate aortic valve stenosis, hypertensive heart disease, HLD, CAD s/p 4V CABG March 2007 who is being admitted today secondary to volume overload. He is known to have chronic diastolic CHF with grade 3 diastolic dysfunction noted on echo in 2017. He has moderate AS. Cardiac cath January 2018 with 4 patent bypass grafts and elevated filling pressures.   Assessment & Plan    Acute on chronic diastolic heart failure -Volume overloaded with progressive dyspnea, weight gain and LE edema despite outpatient diuretics.  -Repeat echo shows EF 40-45% ( down from 55-60% in 05/2016), grade 2 diastolic dysfunction, moderate aortic stenosis, no regional wall motion abnormalities -Aggressive IV diuresis with good response. Changed to oral Lasix yesterday, with no change in weight today. SCr stable.  -Breathing and edema improved. Pt walking in the halls without [place] unchanged today (15 pounds total since admission ?difference in scales). 2.3 L urine output yesterday. Net negative 5.5 L I&O since admission.  -Low sodium, heart healthy diet. Had long discussion on sodium intake and how to avoid in the diet and heart healthy diet low in processed foods. He is going to take a large load of canned foods to the food bank. -Discussed monitoring for fluid overload with daily weight, monitoring edema and  breathing and when to notify the office.  -Plan for discharge today on lasix 80 mg daily  CAD s/p CABG -No chest pain -4 patent graft by cath 08/2016 -continue aspirin, statin, beta blocker and  Imdur  Aortic valve stenosis -Moderate by echo 05/2016. Low gradient by cath 08/2015. May be playing some role in current volume overload.  -Echo shows possible low output AS, moderate, not severe.  -per Dr. Sallyanne Kuster: Repeat assessment of the AV in the near future would be of value.  Avoid dobutamine echo at this time, since the patient has frequent ectopy due to diuretic-related hypokalemia.  Hypertension -BP well controlled (Cozaar 25 mg, metoprolol 50 mg bid, Imdur 30 mg daily)  Hyperlipidemia -Continue Lipitor 20 mg  Hypokalemia -Potassium level improved today with supplementation. We'll continue on K Dur 40 mEq daily on discharge  Signed, Daune Perch, NP  01/10/2017, 9:34 AM   Pager:  [phone]  I have seen and examined the patient along with Daune Perch, NP.  I have reviewed the chart, notes and new data.  I agree with PA/NP's note.  Key new complaints: no dyspnea with activity. Has maintained negative fluid balance on current PO diuretic regimen. Key examination changes: no edema and no JVD. AS murmur is early peaking. Key new findings / data: discussed the echo findings with him and why I believe that his AS is not yet severe. It is likely to become symptomatic in the next few years. Asked him to perform some form of daily physical activity (walking) to allow [place] to compare his exercise capacity over time. He should report exertional angina/dyspnea/syncope. In my opinion, dobutamine echo is not warranted at this time.   PLAN: Decompensation was related to sodium dietary indiscretion (lots of convenience/fast food while moving to a new home). Judging by yesterday echo and his exam, he is probably now at upper limit of desirable fluid range. Should call us if he develops >3 lb  weight gain from today's weight. He is firmly committed to a 2000 mg Na diet restriction. Furosemide 80 mg daily. KCl 40 mEq daily. Early labs next week and TOC f/u 1-2 weeks.  Sanda Klein, MD, [place] [phone] 01/10/2017, 1:24 PM

## 2017-01-10 NOTE — Telephone Encounter (Signed)
TCM Phone Call-Please call Pt-Pt has an appointment with Dr Angelena Form on 01-15-17.

## 2017-01-10 NOTE — Progress Notes (Signed)
Called spoke with patient, advised of cxr results / recs as stated by TP.  Pt verbalized his understanding and denied any questions.  Pt was hospitalized after ov for CHF - HFU scheduled w/ SG for 5.23.18 @ 4pm.

## 2017-01-10 NOTE — Progress Notes (Signed)
Call placed to [facility] to notify of telemetry monitoring d/c.  IV discontinued. Pt is c/a/ox4 and VS WNL.

## 2017-01-10 NOTE — Assessment & Plan Note (Signed)
Moderate per echo on 01/09/17

## 2017-01-13 ENCOUNTER — Telehealth: Payer: Self-pay | Admitting: *Deleted

## 2017-01-13 NOTE — Telephone Encounter (Signed)
D/C 01/10/17 D/C to home DX: Dyspnea  Transition Care Management Follow-up Telephone Call  How have you been since you were released from the hospital? Patient reports he is doing great, feeling much better.    Do you understand why you were in the hospital? Yes, patient voiced understanding that he was admitted due to dyspnea and increased fluid retention   Do you understand the discharge instrcutions? Yes, patient has been following discharge instructions, including daily weights, sodium restrictions, and medication compliance  Items Reviewed:  Medications reviewed: Yes  Allergies reviewed: Yes, no new allergies  Dietary changes reviewed: Yes, reviewed low sodium diet choices; patient voiced understanding   Referrals reviewed: Yes patient to follow up with pulmonology and cardiology on 01/15/17  Functional Questionnaire:   Activities of Daily Living (ADLs):   He states they are independent in the following: ambulation, bathing and hygiene, feeding, continence, grooming, toileting and dressing States they require assistance with the following: N/A    Any transportation issues/concerns?: No   Any patient concerns? No   Confirmed importance and date/time of follow-up visits scheduled: Follow up with Cardiologist and Pulmonologist on 01/15/17; patient to follow up with PCP on 01/17/17 at 0830am  Confirmed with patient if condition begins to worsen call PCP or go to the ER.  Patient was given the Call-a-Nurse line [phone]: Yes, understanding voiced.

## 2017-01-13 NOTE — Telephone Encounter (Signed)
Patient contacted regarding discharge from [facility] on May 18,2018.  Patient understands to follow up with provider Dr Angelena Form on 01/15/17 at 2:20 PM at [facility]. Patient understands discharge instructions? yes Patient understands medications and regiment? yes Patient understands to bring all medications to this visit? yes

## 2017-01-14 ENCOUNTER — Other Ambulatory Visit: Payer: Medicare Other

## 2017-01-15 ENCOUNTER — Encounter: Payer: Self-pay | Admitting: Acute Care

## 2017-01-15 ENCOUNTER — Ambulatory Visit (INDEPENDENT_AMBULATORY_CARE_PROVIDER_SITE_OTHER): Payer: Medicare Other | Admitting: Acute Care

## 2017-01-15 ENCOUNTER — Encounter: Payer: Self-pay | Admitting: Cardiovascular Disease

## 2017-01-15 ENCOUNTER — Ambulatory Visit (INDEPENDENT_AMBULATORY_CARE_PROVIDER_SITE_OTHER): Payer: Medicare Other | Admitting: Cardiovascular Disease

## 2017-01-15 VITALS — BP 108/60 | HR 78 | Ht 67.0 in | Wt 273.4 lb

## 2017-01-15 DIAGNOSIS — J309 Allergic rhinitis, unspecified: Secondary | ICD-10-CM

## 2017-01-15 DIAGNOSIS — I10 Essential (primary) hypertension: Secondary | ICD-10-CM

## 2017-01-15 DIAGNOSIS — I5032 Chronic diastolic (congestive) heart failure: Secondary | ICD-10-CM | POA: Diagnosis not present

## 2017-01-15 DIAGNOSIS — I350 Nonrheumatic aortic (valve) stenosis: Secondary | ICD-10-CM | POA: Diagnosis not present

## 2017-01-15 DIAGNOSIS — I25118 Atherosclerotic heart disease of native coronary artery with other forms of angina pectoris: Secondary | ICD-10-CM

## 2017-01-15 DIAGNOSIS — J4520 Mild intermittent asthma, uncomplicated: Secondary | ICD-10-CM | POA: Diagnosis not present

## 2017-01-15 NOTE — Patient Instructions (Addendum)
It is good to see you today. I am glad you are doing well. Continue using the Symbicort or Dulera. ( Do not use both) 2 puffs twice daily Rinse mouth after use. Continue using nasal sprays as directed. Replace Claritin with Zyrtec or Xyzol once daily. Zantac ( ranitidine) 150 mg at bedtime.( Therapeutic Trial) Continue to keep up with your weight daily. Follow up with [name] as scheduled 03/18/2017. Please contact office for sooner follow up if symptoms do not improve or worsen or seek emergency care

## 2017-01-15 NOTE — Patient Instructions (Signed)
Medication Instructions:  Your physician recommends that you continue on your current medications as directed. Please refer to the Current Medication list given to you today.   Labwork: none  Testing/Procedures: none  Follow-Up: We will make referral for you to [facility].  Any Other Special Instructions Will Be Listed Below (If Applicable).     If you need a refill on your cardiac medications before your next appointment, please call your pharmacy.

## 2017-01-15 NOTE — Assessment & Plan Note (Signed)
Continued improvement and resolution to baseline after 15 pound diuresis for congestive heart failure requiring hospitalization Plan Continue using the Symbicort or Dulera. ( Do not use both) 2 puffs twice daily Rinse mouth after use. Continue using nasal sprays as directed. Replace Claritin with Zyrtec or Xyzol once daily. Zantac ( ranitidine) 150 mg at bedtime.( Therapeutic Trial) Continue to keep up with your weight daily. Follow up with [name] as scheduled 03/18/2017. Please contact office for sooner follow up if symptoms do not improve or worsen or seek emergency care

## 2017-01-15 NOTE — Progress Notes (Signed)
History of Present Illness Justin [name] is a 83 y.o. male former smoker (quit in 1963), followed for chronic cough, mild persistent asthma, and irritable larynx syndrome.Has CAD s/p CABG 2007, Aortic stenosis    01/15/2017 Pt. Presents for hospital follow up. He was admitted 5/15-5/18 for Acute on chronic diastolic CHF (congestive heart failure) ([place]), and aortic stenosis. The patient was admitted on 01/07/17 with volume overload with symptoms of progressive dyspnea, weight gain and lower extremity edema despite outpatient diuretics. He is known to have chronic diastolic CHF with grade 3 diastolic dysfunction noted on echo in 2017. He was diuresed 15 pounds with significant improvement in breathing and edema. He was no longer having orthopnea. He was discharged with some mild lower leg edema. Lasix dosing was switched 80 mg twice a day, and patient was discharged on 80 mg daily. He did require potassium supplementation for hypokalemia. He is been encouraged to maintain a low-sodium heart healthy diet. He is currently monitoring daily weights, edema, and breathing. He is to notify the office of increase in edema, dyspnea, orthopnea, and weight of more than 3 pounds in a day or 5 pounds in a week.. Repeat echocardiogram on 01/09/2017 indicated a decrease in EF and concern for worsening aortic valve stenosis. Cardiology advises repeat assessment of the aortic valve in the near future. Patient states plan is to potentially replace valve in August 2018. He has follow-up appointments with cardiology scheduled for continued evaluation and planning.  Patient states he is feeling better. His dyspnea has significantly resolved with diuresis. He states he no longer has dyspnea on exertion. He states he is able to perform activities of daily living without any distress. This is a significant improvement. We discussed continued maintenance inhaler therapy regimens, . He denies chest pain, fever, orthopnea, or  hemoptysis.     Test Results:  TEST Joya San  Echo: 01/12/2017 When compared to th eprior study from 05/31/2016 LVEF has decreased from 55-60% to 40-45%. Aortic valve is severely calcified and thickened with severely restricted leaflet openings. Transaortic gradient consistent with moderate stenosis, however AVA consistent with severe aortic stenosis. Consider a low Dobutamine stress echocardiogram for further evaluation.  CXR 01/08/2017 Interim clearing of pulmonary interstitial prominence suggesting clearing CHF. Tiny right pleural effusion again noted.  Previously treated with immunotherapy by Dr. Velora Heckler many years prior.  CT sinus 12/2015 -poor exam, no sign sinus dz noted  ENT in past neg eval  Echo 05/2016 showed moderate Aortic Valve stenosis  Follow with Dr. Fredderick Phenix for allergy  IgE ~200  Spiro 2016 mild airflow obstruction  CXR 12/2015 -clear lungs  IgE 11/2016 180    CBC Latest Ref Rng & Units 01/07/2017 09/25/2016 08/14/2016  WBC 4.0 - 10.5 K/uL 11.6(H) 9.7 10.8  Hemoglobin 13.0 - 17.0 g/dL 12.9(L) 14.0 14.0  Hematocrit 39.0 - 52.0 % 41.5 43.0 42.8  Platelets 150 - 400 K/uL 212 230.0 213    BMP Latest Ref Rng & Units 01/10/2017 01/09/2017 01/08/2017  Glucose 65 - 99 mg/dL 102(H) 88 104(H)  BUN 6 - 20 mg/dL 14 11 14   Creatinine 0.61 - 1.24 mg/dL 0.86 0.84 0.82  BUN/Creat Ratio 10 - 24 - - -  Sodium 135 - 145 mmol/L 139 138 139  Potassium 3.5 - 5.1 mmol/L 3.9 3.0(L) 3.0(L)  Chloride 101 - 111 mmol/L 103 98(L) 103  CO2 22 - 32 mmol/L 28 29 27   Calcium 8.9 - 10.3 mg/dL 8.5(L) 8.0(L) 8.0(L)    BNP  Component Value Date/Time   BNP 653.3 (H) 01/07/2017 1705    ProBNP No results found for: PROBNP  PFT No results found for: FEV1PRE, FEV1POST, FVCPRE, FVCPOST, TLC, DLCOUNC, PREFEV1FVCRT, PSTFEV1FVCRT  Dg Chest 2 View  Result Date: 01/03/2017 CLINICAL DATA:  83 year old male with history of chronic shortness of breath. EXAM: CHEST  2 VIEW  COMPARISON:  Chest x-ray 01/05/2016. FINDINGS: Diffuse peribronchial cuffing. Lung volumes are low. No consolidative airspace disease. Small right pleural effusion. No left pleural effusion. No pneumothorax. No pulmonary nodule or mass noted. Pulmonary vasculature is normal. Heart size is mildly enlarged. Upper mediastinal contours are within normal limits. Aortic atherosclerosis. IMPRESSION: 1. Small right-sided pleural effusion. 2. Diffuse peribronchial cuffing, concerning for an acute bronchitis. 3. Mild cardiomegaly (unchanged). 4. Aortic atherosclerosis. Electronically Signed   By: Vinnie Langton M.D.   On: 01/03/2017 10:38   Dg Chest Port 1 View  Result Date: 01/08/2017 CLINICAL DATA:  Shortness of breath. EXAM: PORTABLE CHEST 1 VIEW COMPARISON:  01/03/2017. FINDINGS: Prior CABG. Stable cardiomegaly. Interim improvement of pulmonary interstitial prominence suggesting clearing of CHF. Tiny right pleural effusion again noted. No pneumothorax. IMPRESSION: 1. Prior CABG.  Stable cardiomegaly. 2. Interim clearing of pulmonary interstitial prominence suggesting clearing CHF. Tiny right pleural effusion again noted. Electronically Signed   By: Marcello Moores  Register   On: 01/08/2017 07:25     Past medical hx Past Medical History:  Diagnosis Date  . Age-related macular degeneration, dry, both eyes   . Allergic    "24/7; 365 days/year; I'm allergic to pollens, dust, all southern grasses/trees, mold, mildue, cats, dogs" (01/07/2017)  . Anginal pain ([place])   . Asthma    sees [name]   . Benign prostatic hypertrophy    (sees Dr. Denman George  . CAD (coronary artery disease)    sees Dr. Haroldine Laws   . Carotid bruit    carotid u/s 10/10: 0.39% bilaterally  . CHF (congestive heart failure) ([place])   . Complication of anesthesia 1980s   "w/anal cyst OR, he gave me a saddle block then put a narcotic in spinal cord; had a severe reaction to that" (01/07/2017)  . ED (erectile dysfunction)   . Family history of  adverse reaction to anesthesia    "daughter wakes up during OR" (01/07/2017)  . GERD (gastroesophageal reflux disease)   . Gout   . Heart murmur   . HTN (hypertension)   . Hx of colonic polyps    (sees Dr. Henrene Pastor)  . Hyperlipidemia   . Hypothyroidism   . Leaky heart valve   . Myocardial infarction ([place]) ~ 2000  . Obesity   . Osteoarthritis    "was in my knees, hands" (01/07/2017 )  . Precancerous skin lesion    (sees Dr. Allyson Sabal)  . Prostate cancer ([place]) dx'd ~ 2014  . S/P CABG (coronary artery bypass graft) 2007    Coronary artery bypass grafting x4 with left internal(Edward Servando Snare, MD)     Social History  Substance Use Topics  . Smoking status: Former Smoker    Packs/day: 3.50    Years: 13.00    Types: Cigarettes    Quit date: 1963  . Smokeless tobacco: Never Used  . Alcohol use 0.0 oz/week     Comment: 01/07/2017 "might have 2 beers 2-3 times/month"    Tobacco Cessation: Patient is a former smoker. He has a 45.5 pack years smoking history. He quit smoking in 1963.  Past surgical hx, Family hx, Social hx all reviewed.  Current Outpatient  Prescriptions on File Prior to Visit  Medication Sig  . aspirin 81 MG tablet Take 81 mg by mouth every evening.   [name] atorvastatin (LIPITOR) 20 MG tablet Take 20 mg by mouth daily.  . Azelastine-Fluticasone (DYMISTA) 137-50 MCG/ACT SUSP Place 2 puffs into the nose 2 (two) times daily.  . budesonide-formoterol (SYMBICORT) 80-4.5 MCG/ACT inhaler Inhale 2 puffs into the lungs 2 (two) times daily.  . furosemide (LASIX) 80 MG tablet Take 1 tablet (80 mg total) by mouth daily.  [name] ipratropium (ATROVENT) 0.03 % nasal spray Place 2 sprays into both nostrils every 12 (twelve) hours.  . isosorbide mononitrate (IMDUR) 30 MG 24 hr tablet Take 1 tablet (30 mg total) by mouth daily.  [name] levothyroxine (SYNTHROID, LEVOTHROID) 75 MCG tablet Take 1 tablet (75 mcg total) by mouth daily.  [name] loratadine (CLARITIN) 10 MG tablet Take 10 mg by mouth daily.  [name]  losartan (COZAAR) 25 MG tablet Take 1 tablet (25 mg total) by mouth daily.  . metoprolol (LOPRESSOR) 50 MG tablet Take 1 tablet (50 mg total) by mouth 2 (two) times daily.  . montelukast (SINGULAIR) 10 MG tablet TAKE 1 TABLET IN THE EVENING.  . Multiple Vitamins-Minerals (PRESERVISION AREDS PO) Take 2 tablets by mouth every morning.  . potassium chloride SA (K-DUR,KLOR-CON) 20 MEQ tablet Take 2 tablets (40 mEq total) by mouth daily.  . Probiotic Product (ALIGN) 4 MG CAPS Take 4 mg by mouth daily.   . ranitidine (ZANTAC) 150 MG capsule Take 150 mg by mouth 2 (two) times daily.  . Simethicone (GAS-X EXTRA STRENGTH) 125 MG CAPS Take 125 mg by mouth daily.  . [DISCONTINUED] potassium chloride 20 MEQ TBCR Take 20 mEq by mouth daily.   No current facility-administered medications on file prior to visit.      Allergies  Allergen Reactions  . Amlodipine Other (See Comments)    Swelling in ankles  . Peanut-Containing Drug Products Anaphylaxis  . Sulfonamide Derivatives Anaphylaxis  . Lisinopril Other (See Comments)    cough  . Prednisone Other (See Comments)    Constipation & "he could not sleep" **Can tolerate in certain situations**    Review Of Systems:  Constitutional:   +  weight loss,No night sweats,  Fevers, chills, fatigue, or  lassitude.  HEENT:   No headaches,  Difficulty swallowing,  Tooth/dental problems, or  Sore throat,                No sneezing, itching, ear ache, nasal congestion, post nasal drip,   CV:  No chest pain,  Orthopnea, PND, swelling in lower extremities, anasarca, dizziness, palpitations, syncope.   GI  No heartburn, indigestion, abdominal pain, nausea, vomiting, diarrhea, change in bowel habits, loss of appetite, bloody stools.   Resp: No shortness of breath with exertion or at rest.  No excess mucus, no productive cough,  No non-productive cough,  No coughing up of blood.  No change in color of mucus.  No wheezing.  No chest wall deformity  Skin: no rash  or lesions.  GU: no dysuria, change in color of urine, no urgency or frequency.  No flank pain, no hematuria   MS:  No joint pain or swelling.  No decreased range of motion.  No back pain.  Psych:  No change in mood or affect. No depression or anxiety.  No memory loss.   Vital Signs BP 114/70 (BP Location: Left Arm, Cuff Size: Normal)   Pulse 74   Ht 5\' 7"  (1.702 m)  Wt 272 lb 6.4 oz (123.6 kg)   SpO2 95%   BMI 42.66 kg/m    Physical Exam:  General- No distress,  A&Ox3, pleasant ENT: No sinus tenderness, TM clear, pale nasal mucosa, no oral exudate,no post nasal drip, no LAN Cardiac: S1, S2, regular rate and rhythm, no murmur Chest: No wheeze/ rales/ dullness; no accessory muscle use, no nasal flaring, no sternal retractions Abd.: Soft Non-tender, obese Ext: No clubbing cyanosis, trace bilateral lower extremity edema Neuro:  normal strength Skin: No rashes, warm and dry Psych: normal mood and behavior   Assessment/Plan  Allergic rhinitis Continued improvement on DyMista Plan: Continue using the Symbicort or Dulera. ( Do not use both) 2 puffs twice daily Rinse mouth after use. Continue using nasal sprays as directed. Replace Claritin with Zyrtec or Xyzol once daily. Zantac ( ranitidine) 150 mg at bedtime.( Therapeutic Trial) Continue to keep up with your weight daily. Follow up with [name] as scheduled 03/18/2017. Please contact office for sooner follow up if symptoms do not improve or worsen or seek emergency care     Asthma, intermittent Continued improvement and resolution to baseline after 15 pound diuresis for congestive heart failure requiring hospitalization Plan Continue using the Symbicort or Dulera. ( Do not use both) 2 puffs twice daily Rinse mouth after use. Continue using nasal sprays as directed. Replace Claritin with Zyrtec or Xyzol once daily. Zantac ( ranitidine) 150 mg at bedtime.( Therapeutic Trial) Continue to keep up with your weight  daily. Follow up with [name] as scheduled 03/18/2017. Please contact office for sooner follow up if symptoms do not improve or worsen or seek emergency care       Magdalen Spatz, NP 01/15/2017  9:41 PM

## 2017-01-15 NOTE — Progress Notes (Addendum)
Chief Complaint  Patient presents with  . Follow-up    diastolic chf    History of Present Illness: 83 yo male with history of HTN, HLD, moderate aortic stenosis, chronic diastolic CHF and CAD s/p 4V CABG March 2007 who is here today for cardiac follow up. He is known to have CAD s/p 4V CABG (LIMA to LAD, SVG to Diagonal, SVG to OM, SVG to RCA) with stable disease, 4/4 patent grafts by cath January 2018. He is known to have moderately severe aortic stenosis with echo May 2018 showing moderate AS with mean gradient of 27 mmHg, peak gradient of 58 mmHg, AVA 0.57cm2. Gradient across aortic valve during cath January 2018 was 17 mmHg. Echo may 2018 with mildly reduced LV systolic function, UJWJ=19-14%. He was started on Lasix following his cath in January 2018 for volume overload. I had trouble diuresing him at home. I admitted him to [facility] 01/06/17 from the office and he diuresed 5.5 liters over the next 4 days with 15 lb weight loss.   He is here today for follow up. The patient denies any chest pain, dyspnea, palpitations, lower extremity edema, orthopnea, PND, dizziness, near syncope or syncope.    Primary Care Physician: Laurey Morale, MD   Past Medical History:  Diagnosis Date  . Age-related macular degeneration, dry, both eyes   . Allergic    "24/7; 365 days/year; I'm allergic to pollens, dust, all southern grasses/trees, mold, mildue, cats, dogs" (01/07/2017)  . Anginal pain ([place])   . Asthma    sees [name]   . Benign prostatic hypertrophy    (sees Dr. Denman George  . CAD (coronary artery disease)    sees Dr. Haroldine Laws   . Carotid bruit    carotid u/s 10/10: 0.39% bilaterally  . CHF (congestive heart failure) ([place])   . Complication of anesthesia 1980s   "w/anal cyst OR, he gave me a saddle block then put a narcotic in spinal cord; had a severe reaction to that" (01/07/2017)  . ED (erectile dysfunction)   . Family history of adverse reaction to anesthesia    "daughter wakes up  during OR" (01/07/2017)  . GERD (gastroesophageal reflux disease)   . Gout   . Heart murmur   . HTN (hypertension)   . Hx of colonic polyps    (sees Dr. Henrene Pastor)  . Hyperlipidemia   . Hypothyroidism   . Leaky heart valve   . Myocardial infarction ([place]) ~ 2000  . Obesity   . Osteoarthritis    "was in my knees, hands" (01/07/2017 )  . Precancerous skin lesion    (sees Dr. Allyson Sabal)  . Prostate cancer ([place]) dx'd ~ 2014  . S/P CABG (coronary artery bypass graft) 2007    Coronary artery bypass grafting x4 with left internal(Edward Servando Snare, MD)    Past Surgical History:  Procedure Laterality Date  . ANUS SURGERY     "opened it back up cause it wouldn't heal; wound up w/a fissure" (01/07/2017)  . CARDIAC CATHETERIZATION  10/29/2005  . CARDIAC CATHETERIZATION N/A 08/27/2016   Procedure: Right/Left Heart Cath and Coronary/Graft Angiography;  Surgeon: Burnell Blanks, MD;  Location: [facility];  Service: Cardiovascular;  Laterality: N/A;  . CATARACT EXTRACTION W/ INTRAOCULAR LENS  IMPLANT, BILATERAL Bilateral   . COLONOSCOPY  06/30/2008   no repeats needed   . CORONARY ARTERY BYPASS GRAFT  2007   "CABG X4"  . CYST EXCISION PERINEAL  1980s  . HAMMER TOE SURGERY Bilateral   .  JOINT REPLACEMENT    . KNEE ARTHROPLASTY  07/30/2011   Procedure: COMPUTER ASSISTED TOTAL KNEE ARTHROPLASTY;  Surgeon: Meredith Pel;  Location: [facility];  Service: Orthopedics;  Laterality: Left;  left total knee arthroplasty  . MASTECTOMY SUBCUTANEOUS Bilateral   . ORIF FINGER / THUMB FRACTURE Right ~ 1980   "repair of thumb injury"  . PROSTATE BIOPSY    . REPLACEMENT TOTAL KNEE BILATERAL Bilateral 2012    Current Outpatient Prescriptions  Medication Sig Dispense Refill  . aspirin 81 MG tablet Take 81 mg by mouth every evening.     [name] atorvastatin (LIPITOR) 20 MG tablet Take 20 mg by mouth daily.    . Azelastine-Fluticasone (DYMISTA) 137-50 MCG/ACT SUSP Place 2 puffs into the nose 2 (two) times daily.  1 Bottle 6  . budesonide-formoterol (SYMBICORT) 80-4.5 MCG/ACT inhaler Inhale 2 puffs into the lungs 2 (two) times daily. 1 Inhaler 5  . furosemide (LASIX) 80 MG tablet Take 1 tablet (80 mg total) by mouth daily. 30 tablet 2  . ipratropium (ATROVENT) 0.03 % nasal spray Place 2 sprays into both nostrils every 12 (twelve) hours.    . isosorbide mononitrate (IMDUR) 30 MG 24 hr tablet Take 1 tablet (30 mg total) by mouth daily. 90 tablet 3  . levothyroxine (SYNTHROID, LEVOTHROID) 75 MCG tablet Take 1 tablet (75 mcg total) by mouth daily. 90 tablet 3  . loratadine (CLARITIN) 10 MG tablet Take 10 mg by mouth daily.    [name] losartan (COZAAR) 25 MG tablet Take 1 tablet (25 mg total) by mouth daily. 30 tablet 5  . metoprolol (LOPRESSOR) 50 MG tablet Take 1 tablet (50 mg total) by mouth 2 (two) times daily. 180 tablet 2  . montelukast (SINGULAIR) 10 MG tablet TAKE 1 TABLET IN THE EVENING. 60 tablet 5  . Multiple Vitamins-Minerals (PRESERVISION AREDS PO) Take 2 tablets by mouth every morning.    . potassium chloride SA (K-DUR,KLOR-CON) 20 MEQ tablet Take 2 tablets (40 mEq total) by mouth daily. 30 tablet 2  . Probiotic Product (ALIGN) 4 MG CAPS Take 4 mg by mouth daily.     . ranitidine (ZANTAC) 150 MG capsule Take 150 mg by mouth 2 (two) times daily.    . Simethicone (GAS-X EXTRA STRENGTH) 125 MG CAPS Take 125 mg by mouth daily.     No current facility-administered medications for this visit.     Allergies  Allergen Reactions  . Amlodipine Other (See Comments)    Swelling in ankles  . Peanut-Containing Drug Products Anaphylaxis  . Sulfonamide Derivatives Anaphylaxis  . Lisinopril Other (See Comments)    cough  . Prednisone Other (See Comments)    Constipation & "he could not sleep" **Can tolerate in certain situations**    Social History   Social History  . Marital status: Married    Spouse name: N/A  . Number of children: N/A  . Years of education: N/A   Occupational History  . general  contractor     builds [employer]   Social History Main Topics  . Smoking status: Former Smoker    Packs/day: 3.50    Years: 13.00    Types: Cigarettes    Quit date: 1963  . Smokeless tobacco: Never Used  . Alcohol use 0.0 oz/week     Comment: 01/07/2017 "might have 2 beers 2-3 times/month"  . Drug use: No  . Sexual activity: No   Other Topics Concern  . Not on file   Social History Narrative  FH of CAD, Male 1st degree relative less than age 65.    Family History  Problem Relation Age of Onset  . Heart attack Father 60  . Heart failure Mother 75  . Uterine cancer Mother     Review of Systems:  As stated in the HPI and otherwise negative.   BP 108/60   Pulse 78   Ht 5\' 7"  (1.702 m)   Wt 273 lb 6.4 oz (124 kg)   SpO2 95%   BMI 42.82 kg/m   Physical Examination:  General: Well developed, well nourished, NAD  HEENT: OP clear, mucus membranes moist  SKIN: warm, dry. No rashes. Neuro: No focal deficits  Musculoskeletal: Muscle strength 5/5 all ext  Psychiatric: Mood and affect normal  Neck: No JVD, no carotid bruits, no thyromegaly, no lymphadenopathy.  Lungs:Clear bilaterally, no wheezes, rhonci, crackles Cardiovascular: Regular rate and rhythm. No murmurs, gallops or rubs. Abdomen:Soft. Bowel sounds present. Non-tender.  Extremities: No lower extremity edema. Pulses are 2 + in the bilateral DP/PT.  Echo 01/09/17: - Left ventricle: The cavity size was normal. There was moderate   concentric hypertrophy. Systolic function was mildly to   moderately reduced. The estimated ejection fraction was in the   range of 40% to 45%. Wall motion was normal; there were no   regional wall motion abnormalities. Features are consistent with   a pseudonormal left ventricular filling pattern, with concomitant   abnormal relaxation and increased filling pressure (grade 2   diastolic dysfunction). Doppler parameters are consistent with   elevated ventricular end-diastolic filling  pressure. - Ventricular septum: Septal motion showed paradox. - Aortic valve: Valve mobility was restricted. There was moderate   to severe stenosis. There was mild regurgitation. Mean gradient   (S): 27 mm Hg. Peak gradient (S): 58 mm Hg. Valve area (VTI):   0.63 cm^2. Valve area (Vmax): 0.57 cm^2. Valve area (Vmean): 0.66   cm^2. - Mitral valve: Calcified annulus. Mildly thickened leaflets . - Left atrium: The atrium was mildly dilated. - Right ventricle: The cavity size was mildly dilated. Wall   thickness was normal. Systolic function was moderately reduced.   Cardiac cath 08/27/16: Diagnostic Diagram          EKG:  EKG is not ordered today The ekg ordered today demonstrates   Recent Labs: 12/23/2016: TSH 2.32 01/07/2017: ALT 27; B Natriuretic Peptide 653.3; Hemoglobin 12.9; Magnesium 2.0; Platelets 212 01/10/2017: BUN 14; Creatinine, Ser 0.86; Potassium 3.9; Sodium 139   Lipid Panel    Component Value Date/Time   CHOL 109 09/25/2016 0828   TRIG 133.0 09/25/2016 0828   TRIG 143 06/30/2006 1132   HDL 29.80 (L) 09/25/2016 0828   CHOLHDL 4 09/25/2016 0828   VLDL 26.6 09/25/2016 0828   LDLCALC 53 09/25/2016 0828     Wt Readings from Last 3 Encounters:  01/15/17 273 lb 6.4 oz (124 kg)  01/10/17 274 lb (124.3 kg)  01/06/17 287 lb 1.9 oz (130.2 kg)     Other studies Reviewed: Additional studies/ records that were reviewed today include:  Review of the above records demonstrates:    Assessment and Plan:   1. CAD s/p CABG with angina: No chest pain suggestive of angina on Imdur. CAD stable by cath in January 2018. 4/4 patent grafts. Continue ASA, statin, beta blocker and Imdur.      2. Aortic valve stenosis: I think his AS has progressed and it is likely that he has lower gradients due to low output. He  has slight worsening of he LV function over the last 9 months. He has had evidence of recent heart failure, likely due to his diastolic dysfunction and aortic stenosis.  I have personally reviewed his echo images from may 2018. His aortic valve is thickened with poor leaflet excursion. I will not plan a dobutamine echo given his baseline ventricular ectopy. He will not be a good candidate for surgical AVR. I think he would be a good candidate for TAVR. I will go ahead and begin workup for TAVR. He has had his cardiac cath in January. I will refer him to see Dr. Cyndia Bent or Roxy Manns. He can have his CT scans before that visit. I have reviewed the TAVR procedure in detail with the patient today.   3. HTN: BP controlled. No changes  4. HLD: LDL is controlled. Continue statin.   5. Chronic diastolic CHF: Volume status much better following recent admission for diuresis. Will continue Lasix 80 mg po daily     Current medicines are reviewed at length with the patient today.  The patient does not have concerns regarding medicines.  The following changes have been made:  no change  Labs/ tests ordered today include:   No orders of the defined types were placed in this encounter.  Disposition:  Will arrange an appt with CT surgery to discuss AVR vs TAVR   Signed, [name], MD 01/15/2017 2:38 PM    [address], [place], [place]  [address] Phone: [phone]; Fax: [phone]

## 2017-01-15 NOTE — Assessment & Plan Note (Signed)
Continued improvement on DyMista Plan: Continue using the Symbicort or Dulera. ( Do not use both) 2 puffs twice daily Rinse mouth after use. Continue using nasal sprays as directed. Replace Claritin with Zyrtec or Xyzol once daily. Zantac ( ranitidine) 150 mg at bedtime.( Therapeutic Trial) Continue to keep up with your weight daily. Follow up with [name] as scheduled 03/18/2017. Please contact office for sooner follow up if symptoms do not improve or worsen or seek emergency care

## 2017-01-16 ENCOUNTER — Other Ambulatory Visit: Payer: Self-pay | Admitting: *Deleted

## 2017-01-16 DIAGNOSIS — I350 Nonrheumatic aortic (valve) stenosis: Secondary | ICD-10-CM

## 2017-01-17 ENCOUNTER — Encounter: Payer: Self-pay | Admitting: Family Medicine

## 2017-01-17 ENCOUNTER — Ambulatory Visit (INDEPENDENT_AMBULATORY_CARE_PROVIDER_SITE_OTHER): Payer: Medicare Other | Admitting: Family Medicine

## 2017-01-17 VITALS — BP 121/68 | HR 70 | Temp 98.4°F | Ht 67.0 in | Wt 275.0 lb

## 2017-01-17 DIAGNOSIS — E876 Hypokalemia: Secondary | ICD-10-CM | POA: Diagnosis not present

## 2017-01-17 DIAGNOSIS — R600 Localized edema: Secondary | ICD-10-CM

## 2017-01-17 DIAGNOSIS — I350 Nonrheumatic aortic (valve) stenosis: Secondary | ICD-10-CM | POA: Diagnosis not present

## 2017-01-17 DIAGNOSIS — I10 Essential (primary) hypertension: Secondary | ICD-10-CM

## 2017-01-17 DIAGNOSIS — I5033 Acute on chronic diastolic (congestive) heart failure: Secondary | ICD-10-CM

## 2017-01-17 LAB — BASIC METABOLIC PANEL
BUN: 17 mg/dL (ref 6–23)
CHLORIDE: 102 meq/L (ref 96–112)
CO2: 25 mEq/L (ref 19–32)
CREATININE: 0.76 mg/dL (ref 0.40–1.50)
Calcium: 8.7 mg/dL (ref 8.4–10.5)
GFR: 104.74 mL/min (ref >60.00)
GLUCOSE: 106 mg/dL — AB (ref 70–99)
Potassium: 4.1 mEq/L (ref 3.5–5.1)
Sodium: 135 mEq/L (ref 135–145)

## 2017-01-17 NOTE — Progress Notes (Signed)
   Subjective:    Patient ID: Justin [name], male    DOB: [birth year], 83 y.o.   MRN: [id number]  HPI Here for a transitional care visit after a hospital stay from 01-07-17 to 01-10-17 for acute on chronic CHF. He has had known diastolic failure and now he has systolic failure as well due to a severely stenotic aortic valve. The leaflets are quite thick and there is a pronounced gradient across the valve. An ECHO showed his EF has decreased to 40-45%. He was diuresed 5.5 liters of fluid. His potassium dropped but was corrected. He saw Dr. Angelena Form on 01-15-17 and he has decided that [name] needs a valve replacement surgery. He is currently taking Lasix 80 mg once a day along with potassium 40 mEq once daily. He feels fine with no SOB.    Review of Systems  Constitutional: Negative.   Respiratory: Negative.   Cardiovascular: Positive for leg swelling. Negative for chest pain and palpitations.  Neurological: Negative.        Objective:   Physical Exam  Constitutional: He is oriented to person, place, and time. He appears well-developed and well-nourished.  Cardiovascular: Normal rate, regular rhythm and intact distal pulses.   2/6 SM over the aortic  area   Pulmonary/Chest: Effort normal and breath sounds normal. No respiratory distress. He has no wheezes. He has no rales.  Musculoskeletal:  Trace edema in the feet and ankles   Neurological: He is alert and oriented to person, place, and time.          Assessment & Plan:  He is recovering from a recent bout of both systolic and diastolic heart failure, and he needs an aortic valve replacement. His fluid status is stable for now and he is weighing himself daily at home. Get a BMET today to follow the potassium. He has dramatically reduced his dietary sodium intake.  [name], MD

## 2017-01-17 NOTE — Patient Instructions (Signed)
WE NOW OFFER   [facility] FAST TRACK!!!  SAME DAY Appointments for ACUTE CARE  Such as: Sprains, Injuries, cuts, abrasions, rashes, muscle pain, joint pain, back pain Colds, flu, sore throats, headache, allergies, cough, fever  Ear pain, sinus and eye infections Abdominal pain, nausea, vomiting, diarrhea, upset stomach Animal/insect bites  3 Easy Ways to Schedule: Walk-In Scheduling Call in scheduling Mychart Sign-up: [email or web address]         

## 2017-01-17 NOTE — Addendum Note (Signed)
Addended by: Elmer Picker on: 01/17/2017 09:17 AM   Modules accepted: Orders

## 2017-01-20 NOTE — Progress Notes (Signed)
Reviewed, agree 

## 2017-01-22 ENCOUNTER — Ambulatory Visit: Payer: Medicare Other | Attending: Ophthalmology | Admitting: Occupational Therapy

## 2017-01-22 DIAGNOSIS — H53413 Scotoma involving central area, bilateral: Secondary | ICD-10-CM | POA: Diagnosis not present

## 2017-01-22 NOTE — Therapy (Signed)
[facility] [address] [place], [place], [address] Phone: [phone]   Fax:  [phone]  Occupational Therapy Evaluation  Patient Details  Name: Justin [name] MRN: [id number] Date of Birth: [birth year] Referring Provider: Dr. Billie Ruddy  Encounter Date: 01/22/2017      OT End of Session - 01/22/17 0810    Visit Number 1   Number of Visits 1   Authorization Type MCR   Authorization - Visit Number 1   Authorization - Number of Visits 10   OT Start Time 0805   OT Stop Time 0900   OT Time Calculation (min) 55 min   Activity Tolerance Patient tolerated treatment well   Behavior During Therapy [facility] for tasks assessed/performed      Past Medical History:  Diagnosis Date  . Age-related macular degeneration, dry, both eyes   . Allergic    "24/7; 365 days/year; I'm allergic to pollens, dust, all southern grasses/trees, mold, mildue, cats, dogs" (01/07/2017)  . Anginal pain ([place])   . Asthma    sees [name]   . Benign prostatic hypertrophy    (sees Dr. Denman George  . CAD (coronary artery disease)    sees Dr. Haroldine Laws   . Carotid bruit    carotid u/s 10/10: 0.39% bilaterally  . CHF (congestive heart failure) ([place])   . Complication of anesthesia 1980s   "w/anal cyst OR, he gave me a saddle block then put a narcotic in spinal cord; had a severe reaction to that" (01/07/2017)  . ED (erectile dysfunction)   . Family history of adverse reaction to anesthesia    "daughter wakes up during OR" (01/07/2017)  . GERD (gastroesophageal reflux disease)   . Gout   . Heart murmur   . HTN (hypertension)   . Hx of colonic polyps    (sees Dr. Henrene Pastor)  . Hyperlipidemia   . Hypothyroidism   . Leaky heart valve   . Myocardial infarction ([place]) ~ 2000  . Obesity   . Osteoarthritis    "was in my knees, hands" (01/07/2017 )  . Precancerous skin lesion    (sees Dr. Allyson Sabal)  . Prostate cancer ([place]) dx'd ~ 2014  . S/P  CABG (coronary artery bypass graft) 2007    Coronary artery bypass grafting x4 with left internal(Edward Servando Snare, MD)    Past Surgical History:  Procedure Laterality Date  . ANUS SURGERY     "opened it back up cause it wouldn't heal; wound up w/a fissure" (01/07/2017)  . CARDIAC CATHETERIZATION  10/29/2005  . CARDIAC CATHETERIZATION N/A 08/27/2016   Procedure: Right/Left Heart Cath and Coronary/Graft Angiography;  Surgeon: Burnell Blanks, MD;  Location: [facility];  Service: Cardiovascular;  Laterality: N/A;  . CATARACT EXTRACTION W/ INTRAOCULAR LENS  IMPLANT, BILATERAL Bilateral   . COLONOSCOPY  06/30/2008   no repeats needed   . CORONARY ARTERY BYPASS GRAFT  2007   "CABG X4"  . CYST EXCISION PERINEAL  1980s  . HAMMER TOE SURGERY Bilateral   . JOINT REPLACEMENT    . KNEE ARTHROPLASTY  07/30/2011   Procedure: COMPUTER ASSISTED TOTAL KNEE ARTHROPLASTY;  Surgeon: Meredith Pel;  Location: [facility];  Service: Orthopedics;  Laterality: Left;  left total knee arthroplasty  . MASTECTOMY SUBCUTANEOUS Bilateral   . ORIF FINGER / THUMB FRACTURE Right ~ 1980   "repair of thumb injury"  . PROSTATE BIOPSY    . REPLACEMENT TOTAL KNEE BILATERAL Bilateral 2012    There were no  vitals filed for this visit.      Subjective Assessment - 01/22/17 0807    Subjective  Pt with macular degeneration reports visual deficits which impede performance of ADLS/IADLS           [facility] OT Assessment - 01/22/17 0815      Assessment   Diagnosis macular degeneration   Referring Provider Dr. Billie Ruddy   Onset Date 11/27/16   Prior Therapy PT     Precautions   Precautions Other (comment)  visual deficits     Balance Screen   Has the patient fallen in the past 6 months No   Has the patient had a decrease in activity level because of a fear of falling?  No   Is the patient reluctant to leave their home because of a fear of falling?  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     ADL   ADL comments Pt is modified independent, uses Aeronautical engineer     IADL   Shopping --  Able to shop independently,  unable to read prices   [place] alone or with occasional assistance   Meal Prep Plans, prepares and serves adequate meals independently  difficulty seeeing stove dials   Medication Management Is responsible for taking medication in correct dosages at correct time  needs stronger light   Physiological scientist financial matters independently (budgets, writes checks, pays rent, bills goes to bank), collects and keeps track of income     Mobility   Mobility Status Independent     Written Expression   Dominant Hand Right   Handwriting 100% legible     Vision - History   Baseline Vision Bifocals   Visual History Macular degeneration     Vision Assessment   Vision Assessment Vision tested   Per MD/OD Report OD 20/30, OS 20/50   Reading Acuity --  20/40   Patient has diffculty with activities due to visual impairment Adjusting stove/washing machine dials  unable to read labels in grocery store     [place]                         OT Education - 01/22/17 1306    Education provided Yes   Education Details education regarding macular degeneration/ eccentric viewing, use of pebble mini video magnifier, use of hi- marks, full spectrum lighting-chromalux   Person(s) Educated Patient   Methods Explanation;Demonstration;Verbal cues;Handout   Comprehension Verbalized understanding;Returned demonstration        Pt was also shown 3x hand held and stand  magnifiers yet pt is not interested in pursuing. Therapist provided info regarding purchase of handheld video magnifier.            Plan - 01/22/17 1300    Clinical Impression Statement Pt is an 83 yo with macular degeneration who presents with visual deficits which impede performance of ADLS/ IADLS. Pt can benefit from skilled occupational therapy for education regarding AE/ adapted strategies in order to compensate for visual impairments.   Occupational Profile and client history currently impacting  functional performance macular degeneration, B TKR, bilateral cataract surgery, CHF, CAD, MI, CABG   Occupational performance deficits (Please refer to evaluation for details): ADL's;IADL's;Work;Play;Leisure;Social Participation   Rehab Potential Good   Current Impairments/barriers affecting progress: none   OT Frequency One time visit   OT Duration 8 weeks   OT Treatment/Interventions Self-care/ADL training;DME and/or AE instruction;Patient/family education;Visual/perceptual remediation/compensation   Plan Pt was seen for evaluation and treatment on day of eval. Pt demonstrates good understanding of all education and therefore no goals were set as pt does not require additional visits.      Patient will benefit from skilled therapeutic intervention in order to improve the following deficits and impairments:  Impaired vision/preception  Visit Diagnosis: [place] involving central area, bilateral      G-Codes - 2017/02/18 0916    Functional Assessment Tool Used (Outpatient only) clinical impressions   Functional Limitation Self care   Self Care Current Status (R9163) At least 1 percent but less than 20 percent impaired, limited or restricted   Self Care Goal Status (W4665) At least 1 percent but less than 20 percent impaired, limited or restricted   Self Care Discharge Status [phone]) At least 1 percent but less than 20 percent impaired, limited or restricted      Problem List Patient  Active Problem List   Diagnosis Date Noted  . Hypokalemia due to loss of potassium with diuresis 01/10/2017  . Aortic stenosis 01/10/2017  . Systolic heart failure ([place]) 01/10/2017  . Acute on chronic diastolic CHF (congestive heart failure) ([place]) 01/07/2017  . Hypothyroidism 12/23/2016  . Prostate cancer ([place])   . 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 Edema of both legs 09/01/2014  . Asthma, intermittent 09/01/2014  . Coronary artery disease involving native coronary artery of native heart with unstable angina pectoris ([place]) 01/01/2011  . Aortic valve disorders 01/01/2011  . ACUTE BRONCHITIS 09/06/2010  . VIRAL URI 08/24/2010  . GOUT 09/12/2009  . TINEA PEDIS 08/15/2009  . CELLULITIS, FOOT 08/15/2009  . CAD, AUTOLOGOUS BYPASS GRAFT 05/10/2009  . MURMUR 05/08/2009  . BRUIT 05/08/2009  . MUSCLE CRAMPS 12/12/2008  . KNEE SPRAIN 09/21/2008  . BENIGN PROSTATIC HYPERTROPHY 07/07/2008  . OSTEOARTHRITIS 07/07/2008  . CONTUSION, HIP 05/13/2008  . BENIGN PROSTATIC HYPERTROPHY, WITH OBSTRUCTION 02/01/2008  . LOW BACK PAIN SYNDROME 02/01/2008  . TESTOSTERONE DEFICIENCY 07/03/2007  . HYPERLIPIDEMIA 07/03/2007  . Essential hypertension 07/03/2007  . MYOCARDIAL INFARCTION, HX OF 07/03/2007  . Allergic rhinitis 07/03/2007  . ACTINIC KERATOSIS 07/03/2007  . COLONIC POLYPS, HX OF 07/03/2007  . ACNE ROSACEA, HX OF 07/03/2007    RINE,KATHRYN February 18, 2017, 1:14 PM  [facility] [address] [place], [place], [address] Phone: [phone]   Fax:  [phone]  Name: Justin [name] MRN: [id number] Date of Birth: 1934-03-16    Physician: Dr. Billie Ruddy  Certification Start Date: 9/37/34 Certification End Date: 03/17/17  Physician Documentation Your signature is required to indicate approval of the treatment plan as stated above.  Please sign and either send electronically or make a copy of this report for your files  and return this physician signed original.  Please mark one 1.__approve of plan   2. ___approve of plan with the followingconditions. ____________________________________________________________________________________________________________________________________________   ______________________  _____________________ Physician Signature                                                                     Date    Faxed to MD for signature

## 2017-01-27 DIAGNOSIS — H353132 Nonexudative age-related macular degeneration, bilateral, intermediate dry stage: Secondary | ICD-10-CM | POA: Diagnosis not present

## 2017-01-27 DIAGNOSIS — H52201 Unspecified astigmatism, right eye: Secondary | ICD-10-CM | POA: Diagnosis not present

## 2017-01-27 DIAGNOSIS — Z961 Presence of intraocular lens: Secondary | ICD-10-CM | POA: Diagnosis not present

## 2017-01-28 ENCOUNTER — Ambulatory Visit (HOSPITAL_COMMUNITY)
Admission: RE | Admit: 2017-01-28 | Discharge: 2017-01-28 | Disposition: A | Discharge status: Home / Self Care | Payer: Medicare Other | Source: Ambulatory Visit | Attending: Cardiovascular Disease | Admitting: Cardiovascular Disease

## 2017-01-28 ENCOUNTER — Encounter (HOSPITAL_COMMUNITY): Payer: Self-pay

## 2017-01-28 ENCOUNTER — Ambulatory Visit (HOSPITAL_BASED_OUTPATIENT_CLINIC_OR_DEPARTMENT_OTHER)
Admission: RE | Admit: 2017-01-28 | Discharge: 2017-01-28 | Disposition: A | Discharge status: Home / Self Care | Payer: Medicare Other | Source: Ambulatory Visit | Attending: Cardiovascular Disease | Admitting: Cardiovascular Disease

## 2017-01-28 ENCOUNTER — Ambulatory Visit (HOSPITAL_COMMUNITY): Payer: Medicare Other

## 2017-01-28 DIAGNOSIS — I700 Atherosclerosis of aorta: Secondary | ICD-10-CM | POA: Insufficient documentation

## 2017-01-28 DIAGNOSIS — I350 Nonrheumatic aortic (valve) stenosis: Secondary | ICD-10-CM | POA: Diagnosis not present

## 2017-01-28 DIAGNOSIS — I2510 Atherosclerotic heart disease of native coronary artery without angina pectoris: Secondary | ICD-10-CM | POA: Insufficient documentation

## 2017-01-28 DIAGNOSIS — Z951 Presence of aortocoronary bypass graft: Secondary | ICD-10-CM | POA: Diagnosis not present

## 2017-01-28 DIAGNOSIS — K5730 Diverticulosis of large intestine without perforation or abscess without bleeding: Secondary | ICD-10-CM | POA: Insufficient documentation

## 2017-01-28 DIAGNOSIS — I701 Atherosclerosis of renal artery: Secondary | ICD-10-CM | POA: Insufficient documentation

## 2017-01-28 DIAGNOSIS — I6523 Occlusion and stenosis of bilateral carotid arteries: Secondary | ICD-10-CM | POA: Insufficient documentation

## 2017-01-28 LAB — VAS US CAROTID
LCCAPDIAS: 14 cm/s
LEFT ECA DIAS: -5 cm/s
LEFT VERTEBRAL DIAS: 7 cm/s
LICAPDIAS: -12 cm/s
Left CCA dist dias: -10 cm/s
Left CCA dist sys: -72 cm/s
Left CCA prox sys: 89 cm/s
Left ICA dist dias: -16 cm/s
Left ICA dist sys: -88 cm/s
Left ICA prox sys: -70 cm/s
RCCADSYS: -60 cm/s
RCCAPDIAS: 9 cm/s
RCCAPSYS: 91 cm/s
RIGHT ECA DIAS: 0 cm/s
RIGHT VERTEBRAL DIAS: 6 cm/s

## 2017-01-28 LAB — PULMONARY FUNCTION TEST
DL/VA % pred: 81 %
DL/VA: 3.58 ml/min/mmHg/L
DLCO unc % pred: 57 %
DLCO unc: 16.31 ml/min/mmHg
FEF 25-75 PRE: 1.88 L/s
FEF 25-75 Post: 1.98 L/sec
FEF2575-%CHANGE-POST: 5 %
FEF2575-%Pred-Post: 118 %
FEF2575-%Pred-Pre: 112 %
FEV1-%Change-Post: 0 %
FEV1-%Pred-Post: 86 %
FEV1-%Pred-Pre: 85 %
FEV1-POST: 2.13 L
FEV1-PRE: 2.12 L
FEV1FVC-%CHANGE-POST: 3 %
FEV1FVC-%Pred-Pre: 111 %
FEV6-%CHANGE-POST: -3 %
FEV6-%PRED-PRE: 82 %
FEV6-%Pred-Post: 79 %
FEV6-PRE: 2.68 L
FEV6-Post: 2.59 L
FEV6FVC-%Change-Post: 0 %
FEV6FVC-%PRED-POST: 107 %
FEV6FVC-%Pred-Pre: 107 %
FVC-%CHANGE-POST: -3 %
FVC-%PRED-PRE: 76 %
FVC-%Pred-Post: 73 %
FVC-POST: 2.59 L
FVC-Pre: 2.68 L
POST FEV6/FVC RATIO: 100 %
Post FEV1/FVC ratio: 82 %
Pre FEV1/FVC ratio: 79 %
Pre FEV6/FVC Ratio: 100 %
RV % pred: 74 %
RV: 1.87 L
TLC % pred: 72 %
TLC: 4.67 L

## 2017-01-28 MED ORDER — IOPAMIDOL (ISOVUE-370) INJECTION 76%
INTRAVENOUS | Status: AC
  Administered 2017-01-28: 100 mL
  Filled 2017-01-28: qty 100

## 2017-01-28 MED ORDER — ALBUTEROL SULFATE (2.5 MG/3ML) 0.083% IN NEBU
2.5000 mg | INHALATION_SOLUTION | Freq: Once | RESPIRATORY_TRACT | Status: AC
  Administered 2017-01-28: 2.5 mg via RESPIRATORY_TRACT

## 2017-01-28 MED ORDER — IOPAMIDOL (ISOVUE-370) INJECTION 76%
INTRAVENOUS | Status: AC
  Administered 2017-01-28: 50 mL
  Filled 2017-01-28: qty 50

## 2017-01-28 NOTE — Progress Notes (Signed)
*  PRELIMINARY RESULTS* Vascular Ultrasound Carotid Duplex (Doppler) has been completed.  Preliminary findings: Bilateral: No significant (1-39%) ICA stenosis. Antegrade vertebral flow.    Landry Mellow, RDMS, RVT   01/28/2017, 11:49 AM

## 2017-01-29 ENCOUNTER — Telehealth: Payer: Self-pay | Admitting: *Deleted

## 2017-01-29 ENCOUNTER — Telehealth: Payer: Self-pay | Admitting: Pulmonary Disease

## 2017-01-29 NOTE — Telephone Encounter (Signed)
Received letter from BQ's folder up front. Letter states that since he has been discharged from the hospital on 5/18, he has had a consistent cough. It is a non-productive cough.   Patient had an appt with Eric Form NP on 5/23.   He states he is using vitamin C and hard candy but they are not helping.   Last night (01/28/17) he took a "large" swallow of Hydromet at 10pm and the cough was gone in 10 mins but he was hyper all night until 4am.   He wants to know what he should do next for the cough.   Will place letter in BQ's look-at folder.   BQ, please advise. Thanks!

## 2017-01-29 NOTE — Telephone Encounter (Signed)
Could be a medication effect (Losartan), this is rare and needs to be worked out with a clinic visit.

## 2017-01-29 NOTE — Telephone Encounter (Signed)
Received note pt brought to office regarding consistent cough.  Weight chart also included. Chart reviewed and pt is going to see pulmonary later this week regarding cough. Dr. Angelena Form reviewed weights and these look very good.  I spoke with pt and gave him this information.

## 2017-01-29 NOTE — Telephone Encounter (Signed)
Pt scheduled to see PM at 9:00 on Friday.  Nothing further needed.

## 2017-01-30 ENCOUNTER — Telehealth: Payer: Self-pay | Admitting: Family Medicine

## 2017-01-30 MED ORDER — BENZONATATE 200 MG PO CAPS: 200.0000 mg | ORAL_CAPSULE | Freq: Two times a day (BID) | ORAL | 5 refills | Status: DC | PRN

## 2017-01-30 NOTE — Telephone Encounter (Signed)
Spoke with pt

## 2017-01-30 NOTE — Telephone Encounter (Signed)
He has complained of a throat cough that keeps him up at night. Please tell him that I do not think this comes from any of his medications. Try Benzonatate 200 mg to take bid. Call in #[phone] with 5 rf

## 2017-01-30 NOTE — Telephone Encounter (Signed)
Can you document that you spoke with pt?

## 2017-01-30 NOTE — Telephone Encounter (Signed)
I left a voice message for pt to return my call, also I did send new script e-scribe to [facility]

## 2017-01-31 ENCOUNTER — Encounter: Payer: Self-pay | Admitting: Pulmonary Disease

## 2017-01-31 ENCOUNTER — Ambulatory Visit (INDEPENDENT_AMBULATORY_CARE_PROVIDER_SITE_OTHER): Payer: Medicare Other | Admitting: Pulmonary Disease

## 2017-01-31 VITALS — BP 124/78 | HR 62 | Temp 97.8°F | Ht 67.0 in | Wt 270.0 lb

## 2017-01-31 DIAGNOSIS — R059 Cough, unspecified: Secondary | ICD-10-CM

## 2017-01-31 DIAGNOSIS — J4520 Mild intermittent asthma, uncomplicated: Secondary | ICD-10-CM

## 2017-01-31 DIAGNOSIS — R05 Cough: Secondary | ICD-10-CM

## 2017-01-31 DIAGNOSIS — K219 Gastro-esophageal reflux disease without esophagitis: Secondary | ICD-10-CM | POA: Diagnosis not present

## 2017-01-31 DIAGNOSIS — J309 Allergic rhinitis, unspecified: Secondary | ICD-10-CM

## 2017-01-31 LAB — NITRIC OXIDE: NITRIC OXIDE: 48

## 2017-01-31 MED ORDER — CHLORPHENIRAMINE MALEATE 4 MG PO TABS: 8.0000 mg | ORAL_TABLET | Freq: Three times a day (TID) | ORAL | 3 refills | Status: DC

## 2017-01-31 MED ORDER — BUDESONIDE-FORMOTEROL FUMARATE 160-4.5 MCG/ACT IN AERO: 2.0000 | INHALATION_SPRAY | Freq: Two times a day (BID) | RESPIRATORY_TRACT | 6 refills | Status: DC

## 2017-01-31 MED ORDER — PREDNISONE 10 MG PO TABS: ORAL_TABLET | ORAL | 0 refills | Status: DC

## 2017-01-31 NOTE — Patient Instructions (Addendum)
We will give a prednisone taper. Starting at 40 mg. Reduce dose by 10 mg every 2 days until complete Increase Symbicort to 160/4.5 Continue dymista nasal spray Continue Tessalon and Hycodan Continue Singulair. Stop Zyrtec. Instead of Zyrtec take chlorpheniramine 8 mg 3 times daily Increase zantac to 150 mg bid  Follow up with [name]

## 2017-01-31 NOTE — Progress Notes (Signed)
Justin [name]    [id number]    May 11, 1934  Primary Care Physician:Fry, Ishmael Holter, MD  Referring Physician: Laurey Morale, MD [place], [place] [address]  Chief complaint:  Evaluation for chronic cough  HPI: 83 year old with history of mild persistent asthma, irritable larynx, allergies. He was previously on immunotherapy for his allergies, severe aortic stenosis being assessed for TAVR. His complains of recurrent attacks of sinusitis, allergic rhinitis, chronic cough. He was seen 3 times this year for the same. This was treated with Z-Pak, Mucinex DM, saline nasal rinses, dymista nasal spray, and Symbicort with tremporary relief in symptoms but they recurred.   He has been hospitalized in may 2038 for acute on chronic diastolic heart failure treated with Lasix. Since his discharge he reports worsening cough. He was put on tessalon by PCP and it seems to relieve the cough somewhat. He took hycodan this week and stayed up all night. He reports worsening GERD symptoms.   Outpatient Encounter Prescriptions as of 01/31/2017  Medication Sig  . aspirin 81 MG tablet Take 81 mg by mouth every evening.   [name] atorvastatin (LIPITOR) 20 MG tablet Take 20 mg by mouth daily.  . Azelastine-Fluticasone (DYMISTA) 137-50 MCG/ACT SUSP Place 2 puffs into the nose 2 (two) times daily.  . benzonatate (TESSALON) 200 MG capsule Take 1 capsule (200 mg total) by mouth 2 (two) times daily as needed for cough.  . budesonide-formoterol (SYMBICORT) 80-4.5 MCG/ACT inhaler Inhale 2 puffs into the lungs 2 (two) times daily.  . furosemide (LASIX) 80 MG tablet Take 1 tablet (80 mg total) by mouth daily.  [name] ipratropium (ATROVENT) 0.03 % nasal spray Place 2 sprays into both nostrils every 12 (twelve) hours.  . isosorbide mononitrate (IMDUR) 30 MG 24 hr tablet Take 1 tablet (30 mg total) by mouth daily.  [name] levothyroxine (SYNTHROID, LEVOTHROID) 75 MCG tablet Take 1 tablet (75 mcg total) by mouth  daily.  [name] loratadine (CLARITIN) 10 MG tablet Take 10 mg by mouth daily.  [name] losartan (COZAAR) 25 MG tablet Take 1 tablet (25 mg total) by mouth daily.  . metoprolol (LOPRESSOR) 50 MG tablet Take 1 tablet (50 mg total) by mouth 2 (two) times daily.  . montelukast (SINGULAIR) 10 MG tablet TAKE 1 TABLET IN THE EVENING.  . Multiple Vitamins-Minerals (PRESERVISION AREDS PO) Take 2 tablets by mouth every morning.  . potassium chloride SA (K-DUR,KLOR-CON) 20 MEQ tablet Take 2 tablets (40 mEq total) by mouth daily.  . Probiotic Product (ALIGN) 4 MG CAPS Take 4 mg by mouth daily.   . ranitidine (ZANTAC) 150 MG capsule Take 150 mg by mouth 2 (two) times daily.  . Simethicone (GAS-X EXTRA STRENGTH) 125 MG CAPS Take 125 mg by mouth daily.   No facility-administered encounter medications on file as of 01/31/2017.     Allergies as of 01/31/2017 - Review Complete 01/31/2017  Allergen Reaction Noted  . Amlodipine Other (See Comments) 08/23/2014  . Peanut-containing drug products Anaphylaxis 07/03/2007  . Sulfonamide derivatives Anaphylaxis 07/03/2007  . Lisinopril Other (See Comments) 07/25/2014  . Prednisone Other (See Comments)     Past Medical History:  Diagnosis Date  . Age-related macular degeneration, dry, both eyes   . Allergic    "24/7; 365 days/year; I'm allergic to pollens, dust, all southern grasses/trees, mold, mildue, cats, dogs" (01/07/2017)  . Anginal pain ([place])   . Asthma    sees [name]   . Benign prostatic  hypertrophy    (sees Dr. Denman George  . CAD (coronary artery disease)    sees Dr. Haroldine Laws   . Carotid bruit    carotid u/s 10/10: 0.39% bilaterally  . CHF (congestive heart failure) ([facility])   . Complication of anesthesia 1980s   "w/anal cyst OR, he gave me a saddle block then put a narcotic in spinal cord; had a severe reaction to that" (01/07/2017)  . ED (erectile dysfunction)   . Family history of adverse reaction to anesthesia    "daughter wakes up during OR" (01/07/2017)   . GERD (gastroesophageal reflux disease)   . Gout   . Heart murmur   . HTN (hypertension)   . Hx of colonic polyps    (sees Dr. Henrene Pastor)  . Hyperlipidemia   . Hypothyroidism   . Leaky heart valve   . Myocardial infarction ([facility]) ~ 2000  . Obesity   . Osteoarthritis    "was in my knees, hands" (01/07/2017 )  . Precancerous skin lesion    (sees Dr. Allyson Sabal)  . Prostate cancer ([facility]) dx'd ~ 2014  . S/P CABG (coronary artery bypass graft) 2007    Coronary artery bypass grafting x4 with left internal(Edward Servando Snare, MD)    Past Surgical History:  Procedure Laterality Date  . ANUS SURGERY     "opened it back up cause it wouldn't heal; wound up w/a fissure" (01/07/2017)  . CARDIAC CATHETERIZATION  10/29/2005  . CARDIAC CATHETERIZATION N/A 08/27/2016   Procedure: Right/Left Heart Cath and Coronary/Graft Angiography;  Surgeon: Burnell Blanks, MD;  Location: [facility];  Service: Cardiovascular;  Laterality: N/A;  . CATARACT EXTRACTION W/ INTRAOCULAR LENS  IMPLANT, BILATERAL Bilateral   . COLONOSCOPY  06/30/2008   no repeats needed   . CORONARY ARTERY BYPASS GRAFT  2007   "CABG X4"  . CYST EXCISION PERINEAL  1980s  . HAMMER TOE SURGERY Bilateral   . JOINT REPLACEMENT    . KNEE ARTHROPLASTY  07/30/2011   Procedure: COMPUTER ASSISTED TOTAL KNEE ARTHROPLASTY;  Surgeon: Meredith Pel;  Location: [facility];  Service: Orthopedics;  Laterality: Left;  left total knee arthroplasty  . MASTECTOMY SUBCUTANEOUS Bilateral   . ORIF FINGER / THUMB FRACTURE Right ~ 1980   "repair of thumb injury"  . PROSTATE BIOPSY    . REPLACEMENT TOTAL KNEE BILATERAL Bilateral 2012    Family History  Problem Relation Age of Onset  . Heart attack Father 93  . Heart failure Mother 64  . Uterine cancer Mother     Social History   Social History  . Marital status: Married    Spouse name: N/A  . Number of children: N/A  . Years of education: N/A   Occupational History  . general contractor      builds malls   Social History Main Topics  . Smoking status: Former Smoker    Packs/day: 3.50    Years: 13.00    Types: Cigarettes    Quit date: 1963  . Smokeless tobacco: Never Used  . Alcohol use 0.0 oz/week     Comment: 01/07/2017 "might have 2 beers 2-3 times/month"  . Drug use: No  . Sexual activity: No   Other Topics Concern  . Not on file   Social History Narrative   FH of CAD, Male 1st degree relative less than age 27.    Review of systems: Review of Systems  Constitutional: Negative for fever and chills.  HENT: Negative.   Eyes: Negative for blurred vision.  Respiratory: as per HPI  Cardiovascular: Negative for chest pain and palpitations.  Gastrointestinal: Negative for vomiting, diarrhea, blood per rectum. Genitourinary: Negative for dysuria, urgency, frequency and hematuria.  Musculoskeletal: Negative for myalgias, back pain and joint pain.  Skin: Negative for itching and rash.  Neurological: Negative for dizziness, tremors, focal weakness, seizures and loss of consciousness.  Endo/Heme/Allergies: Negative for environmental allergies.  Psychiatric/Behavioral: Negative for depression, suicidal ideas and hallucinations.  All other systems reviewed and are negative.  Physical Exam: Blood pressure 124/78, pulse 62, temperature 97.8 F (36.6 C), temperature source Oral, height 5\' 7"  (1.702 m), weight 270 lb (122.5 kg), SpO2 100 %. Gen:      No acute distress HEENT:  EOMI, sclera anicteric Neck:     No masses; no thyromegaly Lungs:    Clear to auscultation bilaterally; normal respiratory effort CV:         Regular rate and rhythm; no murmurs Abd:      + bowel sounds; soft, non-tender; no palpable masses, no distension Ext:    No edema; adequate peripheral perfusion Skin:      Warm and dry; no rash Neuro: alert and oriented x 3 Psych: normal mood and affect  Data Reviewed: CTA 01/28/17- Mild scarring in left upper lobe. Lung are otherwise clear.  I have reviewed  all images personally  PFTs 01/28/17 FVC 2.59 [73%) FEV1 2.13 [86%] F/F 82 TLC 72  DLCO 57% Mild restriction with moderate diffusion defect  FENO 12/01/16- 48  Assessment:  Evaluation for cough Asthma He has had recurrent issues since the beginning of the year. He likely has upper airway cough syndrome from allergies, postnasal drip. He has elevated FENO which indicates increased airway inflammation and worsening of asthma control. He had a CT chest 3 days ago that does not show any lung abnormality except for mild LUL scarring.   We will give a short prednisone taper and increase Symbicort to 160/4.5. I don't think he'll need to be on elevated dose of inhaled steroid for long. After control of his symptoms he can titrate back his inhaler therapy.  For his postnasal drip continue on dymista and singulair.  I'll stop zyrtec and use a first-generation antihistamine chlorphentermine for better control of his postnasal drip. As he has increased GERD symptoms we will increase zantac to 150 mg bid.   If his symptoms continue unchanged then we can consider alternatives for losatran. Although ARB does have a lower incidence of cough there have been reports of this. Plus he has increased susceptibility to ARB induced cough given his reaction to lisinopril in past.  Plan/Recommendations: - Pred taper starting at 40 mg - Increase symbicort to 160/4.5 - Continue dymista nasal spray, singulair - Use chlorpheniramine instead of zyrtec - Increase zantac to 150 mg bid  Marshell Garfinkel MD [facility] Pager [phone] 01/31/2017, 9:10 AM  CC: Laurey Morale, MD

## 2017-02-04 ENCOUNTER — Encounter: Payer: Self-pay | Admitting: Cardiothoracic Surgery

## 2017-02-04 ENCOUNTER — Institutional Professional Consult (permissible substitution) (INDEPENDENT_AMBULATORY_CARE_PROVIDER_SITE_OTHER): Payer: Medicare Other | Admitting: Cardiothoracic Surgery

## 2017-02-04 VITALS — BP 100/54 | HR 55 | Resp 20 | Ht 67.0 in | Wt 265.6 lb

## 2017-02-04 DIAGNOSIS — I350 Nonrheumatic aortic (valve) stenosis: Secondary | ICD-10-CM | POA: Diagnosis not present

## 2017-02-04 NOTE — Progress Notes (Signed)
[address]       [place] [address]             [place] Record #[id number] Date of Birth: 10-21-1933  Referring: Burnell Blanks* Primary Care: Laurey Morale, MD  Chief Complaint:    Chief Complaint  Patient presents with  . Aortic Stenosis    Surgical eval for possible TAVR v/s AVR, review all studies    History of Present Illness:    AEDIN JEANSONNE 83 y.o. male is seen in the office  today for Evaluation for of aortic valve replacement versus TAVR In March 2007 he had coronary artery bypass grafting 4 done . Over the past 6 months he's had progressive symptoms of congestive heart failure, with increasing shortness of breath with minimal exertion. He avoids all stairs and hills. He was admitted in April with exacerbation of heart failure, at that time his weight was 286 pounds he's now been stable at 265 on diuretics. Echocardiogram shows peak velocity across his aortic valve of 380.    Current Activity/ Functional Status:  Patient is independent with mobility/ambulation, transfers, ADL's, IADL's.   Zubrod Score: At the time of surgery this patient's most appropriate activity status/level should be described as: []     0    Normal activity, no symptoms []     1    Restricted in physical strenuous activity but ambulatory, able to do out light work [x]     2    Ambulatory and capable of self care, unable to do work activities, up and about               >50 % of waking hours                              []     3    Only limited self care, in bed greater than 50% of waking hours []     4    Completely disabled, no self care, confined to bed or chair []     5    Moribund   Past Medical History:  Diagnosis Date  . Age-related macular degeneration, dry, both eyes   . Allergic    "24/7; 365 days/year; I'm allergic to pollens, dust, all southern grasses/trees, mold, mildue, cats, dogs"  (01/07/2017)  . Anginal pain ([place])   . Asthma    sees [name]   . Benign prostatic hypertrophy    (sees Dr. Denman George  . CAD (coronary artery disease)    sees Dr. Haroldine Laws   . Carotid bruit    carotid u/s 10/10: 0.39% bilaterally  . CHF (congestive heart failure) ([place])   . Complication of anesthesia 1980s   "w/anal cyst OR, he gave me a saddle block then put a narcotic in spinal cord; had a severe reaction to that" (01/07/2017)  . ED (erectile dysfunction)   . Family history of adverse reaction to anesthesia    "daughter wakes up during OR" (01/07/2017)  . GERD (gastroesophageal reflux disease)   . Gout   . Heart murmur   . HTN (hypertension)   . Hx of colonic polyps    (sees Dr. Henrene Pastor)  . Hyperlipidemia   . Hypothyroidism   . Leaky heart valve   . Myocardial infarction ([place]) ~  2000  . Obesity   . Osteoarthritis    "was in my knees, hands" (01/07/2017 )  . Precancerous skin lesion    (sees Dr. Allyson Sabal)  . Prostate cancer ([place]) dx'd ~ 2014  . S/P CABG (coronary artery bypass graft) 2007    Coronary artery bypass grafting x4 with left internal(Ragan Reale Servando Snare, MD)    Past Surgical History:  Procedure Laterality Date  . ANUS SURGERY     "opened it back up cause it wouldn't heal; wound up w/a fissure" (01/07/2017)  . CARDIAC CATHETERIZATION  10/29/2005  . CARDIAC CATHETERIZATION N/A 08/27/2016   Procedure: Right/Left Heart Cath and Coronary/Graft Angiography;  Surgeon: Burnell Blanks, MD;  Location: [facility];  Service: Cardiovascular;  Laterality: N/A;  . CATARACT EXTRACTION W/ INTRAOCULAR LENS  IMPLANT, BILATERAL Bilateral   . COLONOSCOPY  06/30/2008   no repeats needed   . CORONARY ARTERY BYPASS GRAFT  2007   "CABG X4"  . CYST EXCISION PERINEAL  1980s  . HAMMER TOE SURGERY Bilateral   . JOINT REPLACEMENT    . KNEE ARTHROPLASTY  07/30/2011   Procedure: COMPUTER ASSISTED TOTAL KNEE ARTHROPLASTY;  Surgeon: Meredith Pel;  Location: [facility];  Service:  Orthopedics;  Laterality: Left;  left total knee arthroplasty  . MASTECTOMY SUBCUTANEOUS Bilateral   . ORIF FINGER / THUMB FRACTURE Right ~ 1980   "repair of thumb injury"  . PROSTATE BIOPSY    . REPLACEMENT TOTAL KNEE BILATERAL Bilateral 2012    Family History  Problem Relation Age of Onset  . Heart attack Father 17  . Heart failure Mother 54  . Uterine cancer Mother     Social History   Social History  . Marital status: Married    Spouse name: N/A  . Number of children: N/A  . Years of education: N/A   Occupational History  . general contractor     builds malls   Social History Main Topics  . Smoking status: Former Smoker    Packs/day: 3.50    Years: 13.00    Types: Cigarettes    Quit date: 1963  . Smokeless tobacco: Never Used  . Alcohol use 0.0 oz/week     Comment: 01/07/2017 "might have 2 beers 2-3 times/month"  . Drug use: No  . Sexual activity: No   Other Topics Concern  . Not on file   Social History Narrative   FH of CAD, Male 1st degree relative less than age 6.    History  Smoking Status  . Former Smoker  . Packs/day: 3.50  . Years: 13.00  . Types: Cigarettes  . Quit date: 1963  Smokeless Tobacco  . Never Used    History  Alcohol Use  . 0.0 oz/week    Comment: 01/07/2017 "might have 2 beers 2-3 times/month"     Allergies  Allergen Reactions  . Amlodipine Other (See Comments)    Swelling in ankles  . Peanut-Containing Drug Products Anaphylaxis  . Sulfonamide Derivatives Anaphylaxis  . Lisinopril Other (See Comments)    cough  . Prednisone Other (See Comments)    Constipation & "he could not sleep" **Can tolerate in certain situations**    Current Outpatient Prescriptions  Medication Sig Dispense Refill  . aspirin 81 MG tablet Take 81 mg by mouth every evening.     [name] atorvastatin (LIPITOR) 20 MG tablet Take 20 mg by mouth daily.    . Azelastine-Fluticasone (DYMISTA) 137-50 MCG/ACT SUSP Place 2 puffs into the nose 2 (two)  times  daily. 1 Bottle 6  . benzonatate (TESSALON) 200 MG capsule Take 1 capsule (200 mg total) by mouth 2 (two) times daily as needed for cough. 60 capsule 5  . budesonide-formoterol (SYMBICORT) 160-4.5 MCG/ACT inhaler Inhale 2 puffs into the lungs 2 (two) times daily. 1 Inhaler 6  . chlorpheniramine (CHLOR-TRIMETON) 4 MG tablet Take 2 tablets (8 mg total) by mouth 3 (three) times daily. 90 tablet 3  . furosemide (LASIX) 80 MG tablet Take 1 tablet (80 mg total) by mouth daily. 30 tablet 2  . ipratropium (ATROVENT) 0.03 % nasal spray Place 2 sprays into both nostrils every 12 (twelve) hours.    . isosorbide mononitrate (IMDUR) 30 MG 24 hr tablet Take 1 tablet (30 mg total) by mouth daily. 90 tablet 3  . levothyroxine (SYNTHROID, LEVOTHROID) 75 MCG tablet Take 1 tablet (75 mcg total) by mouth daily. 90 tablet 3  . loratadine (CLARITIN) 10 MG tablet Take 10 mg by mouth daily.    [name] losartan (COZAAR) 25 MG tablet Take 1 tablet (25 mg total) by mouth daily. 30 tablet 5  . metoprolol (LOPRESSOR) 50 MG tablet Take 1 tablet (50 mg total) by mouth 2 (two) times daily. 180 tablet 2  . montelukast (SINGULAIR) 10 MG tablet TAKE 1 TABLET IN THE EVENING. 60 tablet 5  . Multiple Vitamins-Minerals (PRESERVISION AREDS PO) Take 2 tablets by mouth every morning.    . potassium chloride SA (K-DUR,KLOR-CON) 20 MEQ tablet Take 2 tablets (40 mEq total) by mouth daily. 30 tablet 2  . predniSONE (DELTASONE) 10 MG tablet 4 tabs x 2 days, 3 days x 2 days, 2 tabs x 2 days, 1 tab x 2 days then stop 20 tablet 0  . Probiotic Product (ALIGN) 4 MG CAPS Take 4 mg by mouth daily.     . ranitidine (ZANTAC) 150 MG capsule Take 150 mg by mouth 2 (two) times daily.    . Simethicone (GAS-X EXTRA STRENGTH) 125 MG CAPS Take 125 mg by mouth daily.     No current facility-administered medications for this visit.      Review of Systems:     Cardiac Review of Systems: Y or N  Chest Pain [    ]  Resting SOB [   ] Exertional SOB  [  ]    Orthopnea [  ]   Pedal Edema [   ]    Palpitations [  ] Syncope  [  ]   Presyncope [   ]  General Review of Systems: [Y] = yes [  ]=no Constitional: recent weight change [  ];  Wt loss over the last 3 months [   ] anorexia [  ]; fatigue [  ]; nausea [  ]; night sweats [  ]; fever [  ]; or chills [  ];          Dental: poor dentition[  ]; Last Dentist visit:   Eye : blurred vision [  ]; diplopia [   ]; vision changes [  ];  Amaurosis fugax[  ]; Resp: cough [  ];  wheezing[  ];  hemoptysis[  ]; shortness of breath[  ]; paroxysmal nocturnal dyspnea[  ]; dyspnea on exertion[  ]; or orthopnea[  ];  GI:  gallstones[  ], vomiting[  ];  dysphagia[  ]; melena[  ];  hematochezia [  ]; heartburn[  ];   Hx of  Colonoscopy[  ]; GU: kidney stones [  ]; hematuria[  ];  dysuria [  ];  nocturia[  ];  history of     obstruction [  ]; urinary frequency [  ]             Skin: rash, swelling[  ];, hair loss[  ];  peripheral edema[  ];  or itching[  ]; Musculosketetal: myalgias[  ];  joint swelling[  ];  joint erythema[  ];  joint pain[  ];  back pain[  ];  Heme/Lymph: bruising[  ];  bleeding[  ];  anemia[  ];  Neuro: TIA[  ];  headaches[  ];  stroke[  ];  vertigo[  ];  seizures[  ];   paresthesias[  ];  difficulty walking[  ];  Psych:depression[  ]; anxiety[  ];  Endocrine: diabetes[  ];  thyroid dysfunction[  ];  Immunizations: Flu up to date [  ]; Pneumococcal up to date [  ];  Other:  Physical Exam: Ht 5\' 7"  (1.702 m)   PHYSICAL EXAMINATION: General appearance: alert and cooperative Head: Normocephalic, without obvious abnormality, atraumatic Neck: no adenopathy, no carotid bruit, no JVD, supple, symmetrical, trachea midline and thyroid not enlarged, symmetric, no tenderness/mass/nodules Lymph nodes: Cervical, supraclavicular, and axillary nodes normal. Resp: clear to auscultation bilaterally Back: symmetric, no curvature. ROM normal. No CVA tenderness. Cardio: regular rate and rhythm, S1, S2 normal, no  murmur, click, rub or gallop GI: soft, non-tender; bowel sounds normal; no masses,  no organomegaly Genitalia: defer exam Extremities: extremities normal, atraumatic, no cyanosis or edema and Homans sign is negative, no sign of DVT Neurologic: Grossly normal  Diagnostic Studies & Laboratory data:     Recent Radiology Findings:   Ct Coronary Morph W/cta Cor W/score W/ca W/cm &/or Wo/cm  Addendum Date: 01/28/2017   ADDENDUM REPORT: 01/28/2017 13:16 CLINICAL DATA:  83 year old male with severe aortic stenosis. EXAM: Cardiac TAVR CT TECHNIQUE: The patient was scanned on a Philips 256 scanner. A 120 kV retrospective scan was triggered in the descending thoracic aorta at 111 HU's. Gantry rotation speed was 270 msecs and collimation was .9 mm. No beta blockade or nitro were given. The 3D data set was reconstructed in 5% intervals of the R-R cycle. Systolic and diastolic phases were analyzed on a dedicated work station using MPR, MIP and VRT modes. The patient received 80 cc of contrast. FINDINGS: Aortic Valve: Trileaflet, severely thickened, moderately calcified aortic valve with minimal calcifications extending into the LVOT. Aorta: Normal size, moderate diffuse calcifications, diffuse atherosclerotic plaque, focal severe protruding plaque in the aortic arch. Sinotubular Junction:  31 x 31 mm Ascending Thoracic Aorta:  36 x 36 mm Aortic Arch:  30 x 26 mm Descending Thoracic Aorta:  26 x 26 mm Sinus of Valsalva Measurements: Non-coronary:  37 mm Right -coronary:  36 mm Left -coronary:  36 mm Coronary Artery Height above Annulus: Left Main:  13 mm Right Coronary:  15 mm Virtual Basal Annulus Measurements: Maximum/Minimum Diameter:  31 x 25 mm Perimeter:  90 mm Area:  619 mm2 Optimum Fluoroscopic Angle for Delivery:  LAO 9 CAU 7 IMPRESSION: 1. Trileaflet, severely thickened, moderately calcified aortic valve with minimal calcifications extending into the LVOT. Annular measurements suitable for delivery of a 29 mm  Edwards-SAPIEN 3 valve. 2. Sufficient annulus to coronary distance. 3. Optimum Fluoroscopic Angle for Delivery:  LAO 9 CAU 7 4. A large left atrial appendage has no evidence for a thrombus. 5. Dilated pulmonary artery measuring 35 x 31 mm suggestive of pulmonary hypertension. Ena Dawley Electronically Signed  By: Ena Dawley   On: 01/28/2017 13:16   Result Date: 01/28/2017 EXAM: OVER-READ INTERPRETATION  CT CHEST The following report is an over-read performed by radiologist Dr. Rebekah [facility] on 01/28/2017. This over-read does not include interpretation of cardiac or coronary anatomy or pathology. The coronary calcium score/coronary CTA interpretation by the cardiologist is attached. COMPARISON:  None. FINDINGS: Extracardiac findings will be described under separate dictation for contemporaneously obtained CTA of the chest, abdomen and pelvis. IMPRESSION: Please see separate dictation for contemporaneously obtained CTA of the chest, abdomen and pelvis dated 01/28/2017 for full description of extracardiac findings. Electronically Signed: By: Vinnie Langton M.D. On: 01/28/2017 10:26   DCt Angio Chest Aorta W/cm &/or Wo/cm  Result Date: 01/28/2017 CLINICAL DATA:  83 year old male with history of severe aortic stenosis. Preprocedural study prior to potential transcatheter aortic valve replacement (TAVR) procedure. EXAM: CT ANGIOGRAPHY CHEST, ABDOMEN AND PELVIS TECHNIQUE: Multidetector CT imaging through the chest, abdomen and pelvis was performed using the standard protocol during bolus administration of intravenous contrast. Multiplanar reconstructed images and MIPs were obtained and reviewed to evaluate the vascular anatomy. CONTRAST:  70 mL of Isovue 370. COMPARISON:  No priors. FINDINGS: CTA CHEST FINDINGS Cardiovascular: Heart size is mildly enlarged. There is no significant pericardial fluid, thickening or pericardial calcification. There is aortic atherosclerosis, as  well as atherosclerosis of the great vessels of the mediastinum and the coronary arteries, including calcified atherosclerotic plaque in the left main, left anterior descending, left circumflex and right coronary arteries. Status post median sternotomy for CABG including LIMA to the LAD. Thickening calcification of the aortic valve. Mediastinum/Lymph Nodes: No pathologically enlarged mediastinal or hilar lymph nodes. Small hiatal hernia. No axillary lymphadenopathy. Lungs/Pleura: Mild scarring in the periphery of the left upper lobe. No acute consolidative airspace disease. No pleural effusions. No suspicious appearing pulmonary nodules or masses. Musculoskeletal/Soft Tissues: Median sternotomy wires. There are no aggressive appearing lytic or blastic lesions noted in the visualized portions of the skeleton. CTA ABDOMEN AND PELVIS FINDINGS Hepatobiliary: No cystic or solid hepatic lesions. No intra or extrahepatic biliary ductal dilatation. Gallbladder is normal in appearance. Pancreas: No pancreatic mass. No pancreatic ductal dilatation. No pancreatic or peripancreatic fluid or inflammatory changes. Spleen: Unremarkable. Adrenals/Urinary Tract: Bilateral kidneys and bilateral adrenal glands are normal in appearance. No hydroureteronephrosis. Urinary bladder is normal in appearance. Stomach/Bowel: The appearance of the stomach is normal. There is no pathologic dilatation of small bowel or colon. Numerous colonic diverticulae are noted, particularly in the sigmoid colon, without surrounding inflammatory changes to suggest an acute diverticulitis at this time. Normal appendix. Vascular/Lymphatic: Aortic atherosclerosis, with vascular findings and measurements pertinent to potential TAVR procedure, as detailed below. No aneurysm or dissection identified in the abdominal or pelvic vasculature. Densely calcified plaque at the origin of the celiac axis, the appearance of which suggests the presence of hemodynamically  significant stenosis (because of the degree of calcification, accurate assessment of severity of stenosis is not possible on today's examination). Similarly, there is a densely calcified plaque at the ostium of the superior mesenteric artery, which also likely causes significant stenosis which is difficult to assess because of the severity of calcification. Inferior mesenteric artery appears to be widely patent without hemodynamically significant stenosis. Single renal arteries bilaterally. Left renal artery is widely patent without hemodynamically significant stenosis. Mild to moderate stenosis at the ostium of the right renal artery secondary to eccentric calcified plaque. No lymphadenopathy noted in the abdomen or pelvis. Reproductive: Prostate gland  and seminal vesicles are unremarkable in appearance. Other: No significant volume of ascites.  No pneumoperitoneum. Musculoskeletal: There are no aggressive appearing lytic or blastic lesions noted in the visualized portions of the skeleton. VASCULAR MEASUREMENTS PERTINENT TO TAVR: AORTA: Minimal Aortic Diameter -  16 x 17 mm Severity of Aortic Calcification -  severe RIGHT PELVIS: Right Common Iliac Artery - Minimal Diameter - 13.4 x 13.4 mm Tortuosity - mild Calcification - mild Right External Iliac Artery - Minimal Diameter - 8.9 x 9.7 mm Tortuosity - mild Calcification - none Right Common Femoral Artery - Minimal Diameter - 9.8 x 8.5 mm Tortuosity - mild Calcification - mild LEFT PELVIS: Left Common Iliac Artery - Minimal Diameter - 14.6 x 8.8 mm Tortuosity - mild Calcification - mild Left External Iliac Artery - Minimal Diameter - 9.8 x 9.6 mm Tortuosity - mild Calcification - none Left Common Femoral Artery - Minimal Diameter - 9.4 x 9.3 mm Tortuosity - mild Calcification - mild Review of the MIP images confirms the above findings. IMPRESSION: 1. Vascular findings and measurements pertinent to potential TAVR procedure, as detailed above. This patient does  appear to have suitable pelvic arterial access bilaterally. 2. Thickening calcification of the aortic valve, compatible with the reported clinical history of severe aortic stenosis. 3. Aortic atherosclerosis, in addition to left main and 3 vessel coronary artery disease. Status post median sternotomy for CABG including LIMA to the LAD. 4. 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 5. Extensive colonic diverticulosis without evidence to suggest an acute diverticulitis at this time. 6. Additional incidental findings, as above. Electronically Signed   By: Vinnie Langton M.D.   On: 01/28/2017 11:29   Ct Angio Abd/pel W/ And/or W/o  Result Date: 01/28/2017 CLINICAL DATA:  83 year old male with history of severe aortic stenosis. Preprocedural study prior to potential transcatheter aortic valve replacement (TAVR) procedure. EXAM: CT ANGIOGRAPHY CHEST, ABDOMEN AND PELVIS TECHNIQUE: Multidetector CT imaging through the chest, abdomen and pelvis was performed using the standard protocol during bolus administration of intravenous contrast. Multiplanar reconstructed images and MIPs were obtained and reviewed to evaluate the vascular anatomy. CONTRAST:  70 mL of Isovue 370. COMPARISON:  No priors. FINDINGS: CTA CHEST FINDINGS Cardiovascular: Heart size is mildly enlarged. There is no significant pericardial fluid, thickening or pericardial calcification. There is aortic atherosclerosis, as well as atherosclerosis of the great vessels of the mediastinum and the coronary arteries, including calcified atherosclerotic plaque in the left main, left anterior descending, left circumflex and right coronary arteries. Status post median sternotomy for CABG including LIMA to the LAD. Thickening calcification of the aortic valve. Mediastinum/Lymph Nodes: No pathologically  enlarged mediastinal or hilar lymph nodes. Small hiatal hernia. No axillary lymphadenopathy. Lungs/Pleura: Mild scarring in the periphery of the left upper lobe. No acute consolidative airspace disease. No pleural effusions. No suspicious appearing pulmonary nodules or masses. Musculoskeletal/Soft Tissues: Median sternotomy wires. There are no aggressive appearing lytic or blastic lesions noted in the visualized portions of the skeleton. CTA ABDOMEN AND PELVIS FINDINGS Hepatobiliary: No cystic or solid hepatic lesions. No intra or extrahepatic biliary ductal dilatation. Gallbladder is normal in appearance. Pancreas: No pancreatic mass. No pancreatic ductal dilatation. No pancreatic or peripancreatic fluid or inflammatory changes. Spleen: Unremarkable. Adrenals/Urinary Tract: Bilateral kidneys and bilateral adrenal glands are normal in appearance. No hydroureteronephrosis. Urinary bladder is normal in appearance.  Stomach/Bowel: The appearance of the stomach is normal. There is no pathologic dilatation of small bowel or colon. Numerous colonic diverticulae are noted, particularly in the sigmoid colon, without surrounding inflammatory changes to suggest an acute diverticulitis at this time. Normal appendix. Vascular/Lymphatic: Aortic atherosclerosis, with vascular findings and measurements pertinent to potential TAVR procedure, as detailed below. No aneurysm or dissection identified in the abdominal or pelvic vasculature. Densely calcified plaque at the origin of the celiac axis, the appearance of which suggests the presence of hemodynamically significant stenosis (because of the degree of calcification, accurate assessment of severity of stenosis is not possible on today's examination). Similarly, there is a densely calcified plaque at the ostium of the superior mesenteric artery, which also likely causes significant stenosis which is difficult to assess because of the severity of calcification. Inferior mesenteric  artery appears to be widely patent without hemodynamically significant stenosis. Single renal arteries bilaterally. Left renal artery is widely patent without hemodynamically significant stenosis. Mild to moderate stenosis at the ostium of the right renal artery secondary to eccentric calcified plaque. No lymphadenopathy noted in the abdomen or pelvis. Reproductive: Prostate gland and seminal vesicles are unremarkable in appearance. Other: No significant volume of ascites.  No pneumoperitoneum. Musculoskeletal: There are no aggressive appearing lytic or blastic lesions noted in the visualized portions of the skeleton. VASCULAR MEASUREMENTS PERTINENT TO TAVR: AORTA: Minimal Aortic Diameter -  16 x 17 mm Severity of Aortic Calcification -  severe RIGHT PELVIS: Right Common Iliac Artery - Minimal Diameter - 13.4 x 13.4 mm Tortuosity - mild Calcification - mild Right External Iliac Artery - Minimal Diameter - 8.9 x 9.7 mm Tortuosity - mild Calcification - none Right Common Femoral Artery - Minimal Diameter - 9.8 x 8.5 mm Tortuosity - mild Calcification - mild LEFT PELVIS: Left Common Iliac Artery - Minimal Diameter - 14.6 x 8.8 mm Tortuosity - mild Calcification - mild Left External Iliac Artery - Minimal Diameter - 9.8 x 9.6 mm Tortuosity - mild Calcification - none Left Common Femoral Artery - Minimal Diameter - 9.4 x 9.3 mm Tortuosity - mild Calcification - mild Review of the MIP images confirms the above findings. IMPRESSION: 1. Vascular findings and measurements pertinent to potential TAVR procedure, as detailed above. This patient does appear to have suitable pelvic arterial access bilaterally. 2. Thickening calcification of the aortic valve, compatible with the reported clinical history of severe aortic stenosis. 3. Aortic atherosclerosis, in addition to left main and 3 vessel coronary artery disease. Status post median sternotomy for CABG including LIMA to the LAD. 4. 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 5. Extensive colonic diverticulosis without evidence to suggest an acute diverticulitis at this time. 6. Additional incidental findings, as above. Electronically Signed   By: Vinnie Langton M.D.   On: 01/28/2017 11:29     I have independently reviewed the above radiology studies  and reviewed the findings with the patient.   Recent Lab Findings: Lab Results  Component Value Date   WBC 11.6 (H) 01/07/2017   HGB 12.9 (L) 01/07/2017   HCT 41.5 01/07/2017   PLT 212 01/07/2017   GLUCOSE 106 (H) 01/17/2017   CHOL 109 09/25/2016   TRIG 133.0 09/25/2016   HDL 29.80 (L) 09/25/2016   LDLCALC 53 09/25/2016   ALT 27 01/07/2017   AST 29 01/07/2017   NA  135 01/17/2017   K 4.1 01/17/2017   CL 102 01/17/2017   CREATININE 0.76 01/17/2017   BUN 17 01/17/2017   CO2 25 01/17/2017   TSH 2.32 12/23/2016   INR 1.1 08/14/2016   CATH: Procedures   Right/Left Heart Cath and Coronary/Graft Angiography  Conclusion     There is moderate aortic valve stenosis.  SVG graft was visualized by angiography and is normal in caliber.  1st Mrg lesion, 65 %stenosed.  Ost LM to LM lesion, 100 %stenosed.  SVG graft was visualized by angiography and is normal in caliber.  LIMA graft was visualized by angiography and is normal in caliber.  Prox RCA to Mid RCA lesion, 100 %stenosed.  SVG graft was visualized by angiography and is normal in caliber and anatomically normal.  Hemodynamic findings consistent with mild pulmonary hypertension.   1. 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  2. The Left main is occluded at the ostium.  3. The LAD fills from the patent IMA graft and the Diagonal fills from the patent vein graft 4. The Circumflex fills from the patent vein  graft to the OM. There is a moderate stenosis in the proximal segment of the OM branch proximal to the insertion of the vein graft.  5. The distal RCA fills from the patent vein graft.  6. Moderate AS 7. Elevated filling pressures  Recommendations: Will continue medical management of CAD. Will start Lasix 20 mg daily for mild volume overload. No indication for AVR at this time.      I have independently reviewed the above  cath films and reviewed the findings with the  patient .  ECHO: Transthoracic Echocardiography  Patient:    Justin [name], Justin [name] MR #:       [id number] Study Date: 01/09/2017 Gender:     M Age:        24 Height:     170.2 cm Weight:     124.7 kg BSA:        2.49 m^2 Pt. Status: Room:       3E16C   ATTENDING    Darlina Guys, MD  ADMITTING    Sonny Dandy, [place], [place], Inpatient  SONOGRAPHER  Alvino Chapel, RCS  cc:  ------------------------------------------------------------------- LV EF: 40% -   45%  ------------------------------------------------------------------- Indications:      CHF - 428.0.  ------------------------------------------------------------------- History:   PMH:   Coronary artery disease.  Aortic valve disease. PMH:   Myocardial infarction.  Risk factors:  Hypertension.  ------------------------------------------------------------------- Study Conclusions  - Left ventricle: The cavity size was normal. There was moderate   concentric hypertrophy. Systolic function was mildly to   moderately reduced. The estimated ejection fraction was in the   range of 40% to 45%. Wall motion was normal; there were no   regional wall motion abnormalities. Features are consistent with   a pseudonormal left ventricular filling pattern, with concomitant   abnormal relaxation and increased filling pressure (grade 2   diastolic dysfunction). Doppler  parameters are consistent with   elevated ventricular end-diastolic filling pressure. - Ventricular septum: Septal motion showed paradox. - Aortic valve: Valve mobility was restricted. There was moderate   to severe stenosis. There was mild regurgitation. Mean gradient   (S): 27 mm Hg. Peak gradient (S): 58 mm Hg. Valve area (VTI):  0.63 cm^2. Valve area (Vmax): 0.57 cm^2. Valve area (Vmean): 0.66   cm^2. - Mitral valve: Calcified annulus. Mildly thickened leaflets . - Left atrium: The atrium was mildly dilated. - Right ventricle: The cavity size was mildly dilated. Wall   thickness was normal. Systolic function was moderately reduced.  Impressions:  - When compared to th eprior study from 05/31/2016 LVEF has   decreased from 55-60% to 40-45%.   Aortic valve is severely calcified and thickened with severely   restricted leaflet openings. Transaortic gradient consistent with   moderate stenosis, however AVA consistent with severe aortic   stenosis. Consider a low Dobutamine stress echocardiogram for   further evaluation.  ------------------------------------------------------------------- Study data:  Comparison was made to the study of 05/31/2016.  Study status:  Routine.  Procedure:  The patient reported no pain pre or post test. Transthoracic echocardiography. Image quality was fair. The study was technically difficult, as a result of poor sound wave transmission and body habitus. Intravenous contrast (Definity) was administered.  Study completion:  There were no complications.     Transthoracic echocardiography.  M-mode, complete 2D, spectral Doppler, and color Doppler.  Birthdate:  Patient birthdate: 23-Jun-1934.  Age:  Patient is 83 yr old.  Sex:  Gender: male. BMI: 43 kg/m^2.  Blood pressure:     120/51  Patient status: Inpatient.  Study date:  Study date: 01/09/2017. Study time: 09:29 AM.  Location:   Bedside.  -------------------------------------------------------------------  ------------------------------------------------------------------- Left ventricle:  The cavity size was normal. There was moderate concentric hypertrophy. Systolic function was mildly to moderately reduced. The estimated ejection fraction was in the range of 40% to 45%. Wall motion was normal; there were no regional wall motion abnormalities. Features are consistent with a pseudonormal left ventricular filling pattern, with concomitant abnormal relaxation and increased filling pressure (grade 2 diastolic dysfunction). Doppler parameters are consistent with elevated ventricular end-diastolic filling pressure.  ------------------------------------------------------------------- Aortic valve:   Trileaflet; severely thickened, severely calcified leaflets. Valve mobility was restricted.  Doppler:   There was moderate to severe stenosis.   There was mild regurgitation.    VTI ratio of LVOT to aortic valve: 0.2. Valve area (VTI): 0.63 cm^2. Indexed valve area (VTI): 0.25 cm^2/m^2. Peak velocity ratio of LVOT to aortic valve: 0.18. Valve area (Vmax): 0.57 cm^2. Indexed valve area (Vmax): 0.23 cm^2/m^2. Mean velocity ratio of LVOT to aortic valve: 0.21. Valve area (Vmean): 0.66 cm^2. Indexed valve area (Vmean): 0.27 cm^2/m^2.    Mean gradient (S): 27 mm Hg. Peak gradient (S): 58 mm Hg.  ------------------------------------------------------------------- Aorta:  Aortic root: The aortic root was normal in size.  ------------------------------------------------------------------- Mitral valve:   Calcified annulus. Mildly thickened leaflets . Mobility was not restricted.  Doppler:  Transvalvular velocity was within the normal range. There was no evidence for stenosis. There was trivial regurgitation.    Valve area by pressure half-time: 3.73 cm^2. Indexed valve area by pressure half-time: 1.5 cm^2/m^2.   Peak  gradient (D): 6 mm Hg.  ------------------------------------------------------------------- Left atrium:  The atrium was mildly dilated.  ------------------------------------------------------------------- Right ventricle:  The cavity size was mildly dilated. Wall thickness was normal. Systolic function was moderately reduced.  ------------------------------------------------------------------- Ventricular septum:   Septal motion showed paradox.  ------------------------------------------------------------------- Pulmonic valve:    Structurally normal valve.   Cusp separation was normal.  Doppler:  Transvalvular velocity was within the normal range. There was no evidence for stenosis. There was trivial regurgitation.  ------------------------------------------------------------------- Tricuspid valve:   Structurally normal valve.    Doppler: Transvalvular  velocity was within the normal range. There was trivial regurgitation.  ------------------------------------------------------------------- Pulmonary artery:   The main pulmonary artery was normal-sized. Systolic pressure was within the normal range.  ------------------------------------------------------------------- Right atrium:  The atrium was normal in size.  ------------------------------------------------------------------- Pericardium:  There was no pericardial effusion.  ------------------------------------------------------------------- Systemic veins: Inferior vena cava: The vessel was dilated. The respirophasic diameter changes were blunted (< 50%), consistent with elevated central venous pressure.  ------------------------------------------------------------------- Measurements   Left ventricle                          Value           Reference  LV ID, ED, PLAX chordal                 49     mm       43 - 52  LV ID, ES, PLAX chordal                 30     mm       23 - 38  LV fx shortening, PLAX  chordal          39     %        >=29  LV PW thickness, ED                     12     mm       ----------  IVS/LV PW ratio, ED                     1.08            <=1.3  Stroke volume, 2D                       58     ml       ----------  Stroke volume/bsa, 2D                   23     ml/m^2   ----------  LV ejection fraction, 1-p A4C           56     %        ----------  LV end-diastolic volume, 2-p            126    ml       ----------  LV end-systolic volume, 2-p             55     ml       ----------  LV ejection fraction, 2-p               57     %        ----------  Stroke volume, 2-p                      72     ml       ----------  LV end-diastolic volume/bsa,            51     ml/m^2   ----------  2-p  LV end-systolic volume/bsa, 2-p         22     ml/m^2   ----------  Stroke volume/bsa, 2-p                  28.7   ml/m^2   ----------  LV e&', lateral                          7.05   cm/s     ----------  LV E/e&', lateral                        17.59           ----------  LV e&', medial                           3.73   cm/s     ----------  LV E/e&', medial                         33.24           ----------  LV e&', average                          5.39   cm/s     ----------  LV E/e&', average                        23.01           ----------  LV ejection time                        410    ms       ----------    Ventricular septum                      Value           Reference  IVS thickness, ED                       13     mm       ----------    LVOT                                    Value           Reference  LVOT ID, S                              20     mm       ----------  LVOT area                               3.14   cm^2     ----------  LVOT peak velocity, S                   70.2   cm/s     ----------  LVOT mean velocity, S                   48.7   cm/s     ----------  LVOT VTI, S                             18.4   cm       ----------  LVOT peak gradient, S  2      mm Hg    ----------    Aortic valve                            Value           Reference  Aortic valve peak velocity, S           380.04 cm/s     ----------  Aortic valve mean velocity, S           230.19 cm/s     ----------  Aortic valve VTI, S                     93.21  cm       ----------  Aortic mean gradient, S                 27     mm Hg    ----------  Aortic peak gradient, S                 58     mm Hg    ----------  VTI ratio, LVOT/AV                      0.2             ----------  Aortic valve area, VTI                  0.63   cm^2     ----------  Aortic valve area/bsa, VTI              0.25   cm^2/m^2 ----------  Velocity ratio, peak, LVOT/AV           0.18            ----------  Aortic valve area, peak                 0.57   cm^2     ----------  velocity  Aortic valve area/bsa, peak             0.23   cm^2/m^2 ----------  velocity  Velocity ratio, mean, LVOT/AV           0.21            ----------  Aortic valve area, mean                 0.66   cm^2     ----------  velocity  Aortic valve area/bsa, mean             0.27   cm^2/m^2 ----------  velocity  Aortic regurg pressure                  628    ms       ----------  half-time    Aorta                                   Value           Reference  Aortic root ID, ED                      36     mm       ----------    Left atrium  Value           Reference  LA ID, A-P, ES                          44     mm       ----------  LA ID/bsa, A-P                          1.77   cm/m^2   <=2.2  LA volume, S                            97.4   ml       ----------  LA volume/bsa, S                        39.1   ml/m^2   ----------  LA volume, ES, 1-p A4C                  82.3   ml       ----------  LA volume/bsa, ES, 1-p A4C              33     ml/m^2   ----------  LA volume, ES, 1-p A2C                  106    ml       ----------  LA volume/bsa, ES, 1-p A2C              42.5   ml/m^2    ----------    Mitral valve                            Value           Reference  Mitral E-wave peak velocity             124    cm/s     ----------  Mitral A-wave peak velocity             34.4   cm/s     ----------  Mitral deceleration time                201    ms       150 - 230  Mitral pressure half-time               59     ms       ----------  Mitral peak gradient, D                 6      mm Hg    ----------  Mitral E/A ratio, peak                  3.6             ----------  Mitral valve area, PHT, DP              3.73   cm^2     ----------  Mitral valve area/bsa, PHT, DP          1.5    cm^2/m^2 ----------    Pulmonary arteries                      Value  Reference  PA pressure, S, DP                      20     mm Hg    <=30    Tricuspid valve                         Value           Reference  Tricuspid regurg peak velocity          176    cm/s     ----------  Tricuspid peak RV-RA gradient           12     mm Hg    ----------    Right atrium                            Value           Reference  RA ID, S-I, ES, A4C             (H)     69.5   mm       34 - 49  RA area, ES, A4C                (H)     23.3   cm^2     8.3 - 19.5  RA volume, ES, A/L                      64.9   ml       ----------  RA volume/bsa, ES, A/L                  26     ml/m^2   ----------    Systemic veins                          Value           Reference  Estimated CVP                           8      mm Hg    ----------    Right ventricle                         Value           Reference  RV ID, ED, PLAX                         35     mm       19 - 38  TAPSE                                   20.9   mm       ----------  RV pressure, S, DP                      20     mm Hg    <=30  RV s&', lateral, S                       10.4   cm/s     ----------  Legend: (L)  and  (H)  mark values outside specified reference  range.  ------------------------------------------------------------------- Prepared and Electronically Authenticated by  Ena Dawley, M.D. 2018-05-17T12:12:11   Assessment / Plan:   Symptomatic severe aortic stenosis and 83 year old patient status post coronary artery bypass grafting with patent grafts who presents with Stage  III, Class C currently controlled with diuretic and limiting activity. After seeing the patient and examining his studies TVAR  placement  Is the preferred    Treatment option for his symptomatic aortic stenosis considering his age and redo status.  The patient is having a temporary bridge replaced next week and would like to proceed with TVAR    I  spent 45 minutes counseling the patient face to face and 50% or more the  time was spent in counseling and coordination of care. The total time spent in the appointment was 60 minutes.  Grace Isaac MD      [address] [place] [address] Office [phone]   Beeper [phone]  02/04/2017 12:58 PM

## 2017-02-05 ENCOUNTER — Encounter: Payer: Medicare Other | Admitting: Cardiothoracic Surgery

## 2017-02-11 ENCOUNTER — Other Ambulatory Visit: Payer: Self-pay | Admitting: *Deleted

## 2017-02-11 DIAGNOSIS — I350 Nonrheumatic aortic (valve) stenosis: Secondary | ICD-10-CM

## 2017-02-20 ENCOUNTER — Encounter: Payer: Medicare Other | Admitting: Thoracic Surgery (Cardiothoracic Vascular Surgery)

## 2017-02-27 ENCOUNTER — Encounter: Payer: Self-pay | Admitting: Physical Therapy

## 2017-02-27 ENCOUNTER — Ambulatory Visit (HOSPITAL_COMMUNITY)
Admission: RE | Admit: 2017-02-27 | Discharge: 2017-02-27 | Disposition: A | Discharge status: Home / Self Care | Payer: Medicare Other | Source: Ambulatory Visit | Attending: Cardiovascular Disease | Admitting: Cardiovascular Disease

## 2017-02-27 ENCOUNTER — Ambulatory Visit: Payer: Medicare Other | Attending: Ophthalmology | Admitting: Physical Therapy

## 2017-02-27 ENCOUNTER — Encounter (HOSPITAL_COMMUNITY): Payer: Self-pay

## 2017-02-27 ENCOUNTER — Encounter (HOSPITAL_COMMUNITY)
Admission: RE | Admit: 2017-02-27 | Discharge: 2017-02-27 | Disposition: A | Discharge status: Home / Self Care | Payer: Medicare Other | Source: Ambulatory Visit | Attending: Cardiovascular Disease | Admitting: Cardiovascular Disease

## 2017-02-27 ENCOUNTER — Encounter: Payer: Medicare Other | Admitting: Surgery

## 2017-02-27 DIAGNOSIS — Z951 Presence of aortocoronary bypass graft: Secondary | ICD-10-CM | POA: Diagnosis not present

## 2017-02-27 DIAGNOSIS — I700 Atherosclerosis of aorta: Secondary | ICD-10-CM | POA: Diagnosis not present

## 2017-02-27 DIAGNOSIS — I350 Nonrheumatic aortic (valve) stenosis: Secondary | ICD-10-CM | POA: Diagnosis not present

## 2017-02-27 DIAGNOSIS — Z01818 Encounter for other preprocedural examination: Secondary | ICD-10-CM | POA: Diagnosis not present

## 2017-02-27 DIAGNOSIS — I517 Cardiomegaly: Secondary | ICD-10-CM | POA: Insufficient documentation

## 2017-02-27 DIAGNOSIS — R2689 Other abnormalities of gait and mobility: Secondary | ICD-10-CM | POA: Diagnosis not present

## 2017-02-27 LAB — PROTIME-INR
INR: 1.02
PROTHROMBIN TIME: 13.4 s (ref 11.4–15.2)

## 2017-02-27 LAB — BLOOD GAS, ARTERIAL
Acid-Base Excess: 3 mmol/L — ABNORMAL HIGH (ref 0.0–2.0)
Bicarbonate: 26.5 mmol/L (ref 20.0–28.0)
DRAWN BY: 421801
O2 SAT: 95 %
PCO2 ART: 36.4 mmHg (ref 32.0–48.0)
PH ART: 7.475 — AB (ref 7.350–7.450)
PO2 ART: 69.5 mmHg — AB (ref 83.0–108.0)
Patient temperature: 98.6

## 2017-02-27 LAB — APTT: APTT: 31 s (ref 24–36)

## 2017-02-27 LAB — SURGICAL PCR SCREEN
MRSA, PCR: NEGATIVE
STAPHYLOCOCCUS AUREUS: NEGATIVE

## 2017-02-27 LAB — COMPREHENSIVE METABOLIC PANEL
ALBUMIN: 3.2 g/dL — AB (ref 3.5–5.0)
ALK PHOS: 77 U/L (ref 38–126)
ALT: 20 U/L (ref 17–63)
AST: 21 U/L (ref 15–41)
Anion gap: 11 (ref 5–15)
BILIRUBIN TOTAL: 1 mg/dL (ref 0.3–1.2)
BUN: 12 mg/dL (ref 6–20)
CALCIUM: 8.5 mg/dL — AB (ref 8.9–10.3)
CO2: 23 mmol/L (ref 22–32)
CREATININE: 0.75 mg/dL (ref 0.61–1.24)
Chloride: 103 mmol/L (ref 101–111)
GFR calc Af Amer: >60 mL/min (ref >60)
GLUCOSE: 99 mg/dL (ref 65–99)
Potassium: 3.9 mmol/L (ref 3.5–5.1)
Sodium: 137 mmol/L (ref 135–145)
TOTAL PROTEIN: 6.9 g/dL (ref 6.5–8.1)

## 2017-02-27 LAB — URINALYSIS, ROUTINE W REFLEX MICROSCOPIC
BILIRUBIN URINE: NEGATIVE
GLUCOSE, UA: NEGATIVE mg/dL
HGB URINE DIPSTICK: NEGATIVE
KETONES UR: NEGATIVE mg/dL
Leukocytes, UA: NEGATIVE
NITRITE: NEGATIVE
PH: 6 (ref 5.0–8.0)
Protein, ur: NEGATIVE mg/dL
SPECIFIC GRAVITY, URINE: 1.005 (ref 1.005–1.030)

## 2017-02-27 LAB — TYPE AND SCREEN
ABO/RH(D): A POS
ANTIBODY SCREEN: NEGATIVE

## 2017-02-27 LAB — CBC
HEMATOCRIT: 45.9 % (ref 39.0–52.0)
HEMOGLOBIN: 14.5 g/dL (ref 13.0–17.0)
MCH: 27 pg (ref 26.0–34.0)
MCHC: 31.6 g/dL (ref 30.0–36.0)
MCV: 85.3 fL (ref 78.0–100.0)
Platelets: 214 10*3/uL (ref 150–400)
RBC: 5.38 MIL/uL (ref 4.22–5.81)
RDW: 16.5 % — AB (ref 11.5–15.5)
WBC: 11.3 10*3/uL — AB (ref 4.0–10.5)

## 2017-02-27 NOTE — Progress Notes (Signed)
   02/27/17 1508  OBSTRUCTIVE SLEEP APNEA  Have you ever been diagnosed with sleep apnea through a sleep study? No  Do you snore loudly (loud enough to be heard through closed doors)?  0  Do you often feel tired, fatigued, or sleepy during the daytime (such as falling asleep during driving or talking to someone)? 0  Has anyone observed you stop breathing during your sleep? 0  Do you have, or are you being treated for high blood pressure? 1  BMI more than 35 kg/m2? 1  Age > 50 (1-yes) 1  Neck circumference greater than:Male 16 inches or larger, Male 17inches or larger? 1  Male Gender (Yes=1) 1  Obstructive Sleep Apnea Score 5  Score 5 or greater  Results sent to PCP

## 2017-02-27 NOTE — Therapy (Signed)
[place], [place], [address] Phone: [phone]   Fax:  [phone]  Physical Therapy Evaluation  Patient Details  Name: Justin [name] MRN: [id number] Date of Birth: 1934/07/25 Referring Provider: Dr. Gilford Raid  Encounter Date: 02/27/2017      PT End of Session - 02/27/17 1202    Visit Number 1   PT Start Time 1202   PT Stop Time 3536   PT Time Calculation (min) 43 min      Past Medical History:  Diagnosis Date  . Age-related macular degeneration, dry, both eyes   . Allergic    "24/7; 365 days/year; I'm allergic to pollens, dust, all southern grasses/trees, mold, mildue, cats, dogs" (01/07/2017)  . Anginal pain ([place])   . Asthma    sees [name]   . Benign prostatic hypertrophy    (sees Dr. Denman George  . CAD (coronary artery disease)    sees Dr. Haroldine Laws   . Carotid bruit    carotid u/s 10/10: 0.39% bilaterally  . CHF (congestive heart failure) ([place])   . Complication of anesthesia 1980s   "w/anal cyst OR, he gave me a saddle block then put a narcotic in spinal cord; had a severe reaction to that" (01/07/2017)  . ED (erectile dysfunction)   . Family history of adverse reaction to anesthesia    "daughter wakes up during OR" (01/07/2017)  . GERD (gastroesophageal reflux disease)   . Gout   . Heart murmur   . HTN (hypertension)   . Hx of colonic polyps    (sees Dr. Henrene Pastor)  . Hyperlipidemia   . Hypothyroidism   . Leaky heart valve   . Myocardial infarction ([place]) ~ 2000  . Obesity   . Osteoarthritis    "was in my knees, hands" (01/07/2017 )  . Precancerous skin lesion    (sees Dr. Allyson Sabal)  . Prostate cancer ([place]) dx'd ~ 2014  . S/P CABG (coronary artery bypass graft) 2007    Coronary artery bypass grafting x4 with left internal(Edward Servando Snare, MD)    Past Surgical History:  Procedure Laterality Date  . ANUS SURGERY     "opened it back up cause it wouldn't heal; wound up w/a  fissure" (01/07/2017)  . CARDIAC CATHETERIZATION  10/29/2005  . CARDIAC CATHETERIZATION N/A 08/27/2016   Procedure: Right/Left Heart Cath and Coronary/Graft Angiography;  Surgeon: Burnell Blanks, MD;  Location: [facility];  Service: Cardiovascular;  Laterality: N/A;  . CATARACT EXTRACTION W/ INTRAOCULAR LENS  IMPLANT, BILATERAL Bilateral   . COLONOSCOPY  06/30/2008   no repeats needed   . CORONARY ARTERY BYPASS GRAFT  2007   "CABG X4"  . CYST EXCISION PERINEAL  1980s  . HAMMER TOE SURGERY Bilateral   . JOINT REPLACEMENT    . KNEE ARTHROPLASTY  07/30/2011   Procedure: COMPUTER ASSISTED TOTAL KNEE ARTHROPLASTY;  Surgeon: Meredith Pel;  Location: [facility];  Service: Orthopedics;  Laterality: Left;  left total knee arthroplasty  . MASTECTOMY SUBCUTANEOUS Bilateral   . ORIF FINGER / THUMB FRACTURE Right ~ 1980   "repair of thumb injury"  . PROSTATE BIOPSY    . REPLACEMENT TOTAL KNEE BILATERAL Bilateral 2012    There were no vitals filed for this visit.       Subjective Assessment - 02/27/17 1207    Subjective Pt reports progressive shortness of breath with minimal exertion over the past 6 months due to heart failure. Hospitalization a couple of months ago  improved fluid retention and shortness of breath. Pt reports he still gets short of breath with ambulating up inclines but denies chest pain.    Patient Stated Goals to fix heart   Currently in Pain? No/denies            [facility] PT Assessment - 02/27/17 0001      Assessment   Medical Diagnosis severeal aortic stenosis   Referring Provider Dr. Gilford Raid   Onset Date/Surgical Date --  approximately 6 months     Precautions   Precautions Other (comment)   Precaution Comments visual deficits     Restrictions   Weight Bearing Restrictions No     Balance Screen   Has the patient fallen in the past 6 months No   Has the patient had a decrease in activity level because of a fear of falling?  No   Is the patient  reluctant to leave their home because of a fear of falling?  No     Home Environment   Living Environment Private residence   Living Arrangements Spouse/significant other   Home Access Level entry   [place] One level     Prior Function   Level of Independence Independent     Posture/Postural Control   Posture/Postural Control Postural limitations   Postural Limitations Rounded Shoulders;Forward head  mild     ROM / Strength   AROM / PROM / Strength AROM;Strength     AROM   Overall AROM Comments grossly WNl     Strength   Overall Strength Comments grossly 5/5 throughout   Strength Assessment Site Hand   Right/Left hand Right;Left   Right Hand Grip (lbs) 78  R hand dominant   Left Hand Grip (lbs) 75     Ambulation/Gait   Gait Comments Pt externally rotates bil with ambulation. Gait distance limited by 18% for age/gender.           [facility] Pre-Surgical Assessment - 02/27/17 0001    5 Meter Walk Test- trial 1 4 sec   5 Meter Walk Test- trial 2 5 sec.    5 Meter Walk Test- trial 3 5 sec.   5 meter walk test average 4.67 sec   4 Stage Balance Test tolerated for:  3 sec.   4 Stage Balance Test Position 4   Sit To Stand Test- trial 1 16 sec.   ADL/IADL Independent with: Bathing;Dressing;Meal prep;Finances   ADL/IADL Needs Assistance with: Valla Leaver work  never done it   6 Minute Walk- Baseline yes   BP (mmHg) 104/90   HR (bpm) 58   02 Sat (%RA) 97 %   Modified Borg Scale for Dyspnea 0- Nothing at all   Perceived Rate of Exertion (Borg) 6-   6 Minute Walk Post Test yes   BP (mmHg) 167/70   HR (bpm) 93   02 Sat (%RA) 93 %   Modified Borg Scale for Dyspnea 2- Mild shortness of breath   Perceived Rate of Exertion (Borg) 9- very light   Aerobic Endurance Distance Walked 1110          Objective measurements completed on examination: See above findings.                               Plan - 02/27/17 1230    Clinical Impression Statement see  below   PT Frequency One time visit   Consulted and Agree with Plan of Care  Patient     Clinical Impression Statement: Pt is a well appearing 83 yo male presenting to OP PT for evaluation prior to possible TAVR surgery due to severe aortic stenosis. Pt reports onset of shortness of breath with minimal exertion approximately 6 months ago due to heart failure. After hospitalization and fluid removal, pt is feeling better however he still reports shortness of breath when ambulating up inclines. Pt presents with good ROM and strength, good balance and is not at high fall risk 4 stage balance test, good walking speed and fair aerobic endurance per 6 minute walk test.  Pt ambulated a total of 1110 feet in 6 minute walk. Systolic BP increased significantly with 6 minute walk test. Based on the Short Physical Performance Battery, patient has a frailty rating of 10/12 with </= 5/12 considered frail.   Patient demonstrated the following deficits and impairments:     Visit Diagnosis: Other abnormalities of gait and mobility      G-Codes - 03/07/2017 1251    Functional Assessment Tool Used (Outpatient Only) 6 minute walk    Functional Limitation Mobility: Walking and moving around   Mobility: Walking and Moving Around Current Status [phone]) At least 1 percent but less than 20 percent impaired, limited or restricted   Mobility: Walking and Moving Around Goal Status [phone]) At least 1 percent but less than 20 percent impaired, limited or restricted   Mobility: Walking and Moving Around Discharge Status [phone]) At least 1 percent but less than 20 percent impaired, limited or restricted       Problem List Patient Active Problem List   Diagnosis Date Noted  . Hypokalemia due to loss of potassium with diuresis 01/10/2017  . Aortic stenosis 01/10/2017  . Systolic heart failure ([place]) 01/10/2017  . Acute on chronic diastolic CHF (congestive heart failure) ([place]) 01/07/2017  . Hypothyroidism 12/23/2016  .  Prostate cancer ([place])   . 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 Edema of both legs 09/01/2014  . Asthma, intermittent 09/01/2014  . Coronary artery disease involving native coronary artery of native heart with unstable angina pectoris ([place]) 01/01/2011  . Aortic valve disorders 01/01/2011  . ACUTE BRONCHITIS 09/06/2010  . VIRAL URI 08/24/2010  . GOUT 09/12/2009  . TINEA PEDIS 08/15/2009  . CELLULITIS, FOOT 08/15/2009  . CAD, AUTOLOGOUS BYPASS GRAFT 05/10/2009  . MURMUR 05/08/2009  . BRUIT 05/08/2009  . MUSCLE CRAMPS 12/12/2008  . KNEE SPRAIN 09/21/2008  . BENIGN PROSTATIC HYPERTROPHY 07/07/2008  . OSTEOARTHRITIS 07/07/2008  . CONTUSION, HIP 05/13/2008  . BENIGN PROSTATIC HYPERTROPHY, WITH OBSTRUCTION 02/01/2008  . LOW BACK PAIN SYNDROME 02/01/2008  . TESTOSTERONE DEFICIENCY 07/03/2007  . HYPERLIPIDEMIA 07/03/2007  . Essential hypertension 07/03/2007  . MYOCARDIAL INFARCTION, HX OF 07/03/2007  . Allergic rhinitis 07/03/2007  . ACTINIC KERATOSIS 07/03/2007  . COLONIC POLYPS, HX OF 07/03/2007  . ACNE ROSACEA, HX OF 07/03/2007    Tikisha Molinaro, PT 03-07-2017, 12:52 PM  [facility] [address] [place], [place], [address] Phone: [phone]   Fax:  [phone]  Name: Justin [name] MRN: [id number] Date of Birth: Aug 02, 1934

## 2017-02-27 NOTE — Pre-Procedure Instructions (Signed)
Justin [name]  02/27/2017     Your procedure is scheduled on Tuesday, March 04, 2017 at 7:30 AM.   Report to [facility] Entrance "A" Admitting Office at 5:30 AM.   Call this number if you have problems the morning of surgery: [phone]   Questions prior to day of surgery, please call [phone] between 8 & 4 PM.   Remember:  Do not eat food or drink liquids after midnight Monday, 03/02/17.  Take these medicines the morning of surgery with A SIP OF WATER: Cetirizine (Zyrtec), Isosorbide Mononitrate (Imdur), Levothyroxine (Synthroid), Metoprolol (Lopressor), Ranitidine (Zantac), Nasal spray, Symbicort inhaler  Do not use NSAIDS (Ibuprofen, Aleve, etc) prior to surgery as of today. Stop Multivitamins as of today.   Do not wear jewelry.  Do not wear lotions, powders or cologne or deodorant.  Men may shave face and neck.  Do not bring valuables to the hospital.  [facility] is not responsible for any belongings or valuables.  Contacts, dentures or bridgework may not be worn into surgery.  Leave your suitcase in the car.  After surgery it may be brought to your room.  For patients admitted to the hospital, discharge time will be determined by your treatment team.  [facility] - Preparing for Surgery  Before surgery, you can play an important role.  Because skin is not sterile, your skin needs to be as free of germs as possible.  You can reduce the number of germs on you skin by washing with CHG (chlorahexidine gluconate) soap before surgery.  CHG is an antiseptic cleaner which kills germs and bonds with the skin to continue killing germs even after washing.  Please DO NOT use if you have an allergy to CHG or antibacterial soaps.  If your skin becomes reddened/irritated stop using the CHG and inform your nurse when you arrive at Short Stay.  Do not shave (including legs and underarms) for at least 48 hours prior to the first CHG shower.  You may shave your face.  Please  follow these instructions carefully:   1.  Shower with CHG Soap the night before surgery and the                    morning of Surgery.  2.  If you choose to wash your hair, wash your hair first as usual with your       normal shampoo.  3.  After you shampoo, rinse your hair and body thoroughly to remove the shampoo.  4.  Use CHG as you would any other liquid soap.  You can apply chg directly       to the skin and wash gently with scrungie or a clean washcloth.  5.  Apply the CHG Soap to your body ONLY FROM THE NECK DOWN.        Do not use on open wounds or open sores.  Avoid contact with your eyes, ears, mouth and genitals (private parts).  Wash genitals (private parts) with your normal soap.  6.  Wash thoroughly, paying special attention to the area where your surgery        will be performed.  7.  Thoroughly rinse your body with warm water from the neck down.  8.  DO NOT shower/wash with your normal soap after using and rinsing off       the CHG Soap.  9.  Pat yourself dry with a clean towel.  10.  Wear clean pajamas.            11.  Place clean sheets on your bed the night of your first shower and do not        sleep with pets.  Day of Surgery  Do not apply any lotions/deodorants the morning of surgery.  Please wear clean clothes to the hospital.    Please read over the fact sheets that you were given.

## 2017-02-27 NOTE — Progress Notes (Signed)
Pt has long hx of CAD with CABG in 2007, CHF and Aortic stenosis. Pt states he is not diabetic.

## 2017-02-28 ENCOUNTER — Other Ambulatory Visit (HOSPITAL_COMMUNITY): Payer: Medicare Other

## 2017-02-28 LAB — HEMOGLOBIN A1C
Hgb A1c MFr Bld: 6.5 % — ABNORMAL HIGH (ref 4.8–5.6)
MEAN PLASMA GLUCOSE: 140 mg/dL

## 2017-03-03 ENCOUNTER — Institutional Professional Consult (permissible substitution) (INDEPENDENT_AMBULATORY_CARE_PROVIDER_SITE_OTHER): Payer: Medicare Other | Admitting: Thoracic Surgery (Cardiothoracic Vascular Surgery)

## 2017-03-03 ENCOUNTER — Encounter: Payer: Self-pay | Admitting: Thoracic Surgery (Cardiothoracic Vascular Surgery)

## 2017-03-03 VITALS — BP 111/54 | HR 70 | Resp 20 | Ht 67.0 in | Wt 268.0 lb

## 2017-03-03 DIAGNOSIS — Z951 Presence of aortocoronary bypass graft: Secondary | ICD-10-CM | POA: Diagnosis not present

## 2017-03-03 DIAGNOSIS — I350 Nonrheumatic aortic (valve) stenosis: Secondary | ICD-10-CM

## 2017-03-03 DIAGNOSIS — I5022 Chronic systolic (congestive) heart failure: Secondary | ICD-10-CM

## 2017-03-03 MED ORDER — DOPAMINE-DEXTROSE 3.2-5 MG/ML-% IV SOLN
0.0000 ug/kg/min | INTRAVENOUS | Status: DC
Start: 2017-03-04 — End: 2017-03-04
  Filled 2017-03-03: qty 250

## 2017-03-03 MED ORDER — SODIUM CHLORIDE 0.9 % IV SOLN: INTRAVENOUS | Status: DC

## 2017-03-03 MED ORDER — SODIUM CHLORIDE 0.9 % IV SOLN
INTRAVENOUS | Status: DC
  Filled 2017-03-03: qty 30

## 2017-03-03 MED ORDER — POTASSIUM CHLORIDE 2 MEQ/ML IV SOLN
80.0000 meq | INTRAVENOUS | Status: DC
Start: 2017-03-04 — End: 2017-03-04
  Filled 2017-03-03: qty 40

## 2017-03-03 MED ORDER — NITROGLYCERIN IN D5W 200-5 MCG/ML-% IV SOLN
2.0000 ug/min | INTRAVENOUS | Status: DC
  Filled 2017-03-03: qty 250

## 2017-03-03 MED ORDER — DEXMEDETOMIDINE HCL IN NACL 400 MCG/100ML IV SOLN
0.1000 ug/kg/h | INTRAVENOUS | Status: AC
Start: 2017-03-04 — End: 2017-03-04
  Administered 2017-03-04: .4 ug/kg/h via INTRAVENOUS
  Filled 2017-03-03: qty 100

## 2017-03-03 MED ORDER — CHLORHEXIDINE GLUCONATE 0.12 % MT SOLN: 15.0000 mL | Freq: Once | OROMUCOSAL | Status: DC

## 2017-03-03 MED ORDER — DEXTROSE 5 % IV SOLN
1.5000 g | INTRAVENOUS | Status: AC
  Administered 2017-03-04: 1.5 g via INTRAVENOUS
  Filled 2017-03-03: qty 1.5

## 2017-03-03 MED ORDER — INSULIN REGULAR HUMAN 100 UNIT/ML IJ SOLN
INTRAMUSCULAR | Status: DC
  Filled 2017-03-03: qty 1

## 2017-03-03 MED ORDER — EPINEPHRINE PF 1 MG/ML IJ SOLN
0.0000 ug/min | INTRAVENOUS | Status: DC
  Filled 2017-03-03: qty 4

## 2017-03-03 MED ORDER — SODIUM CHLORIDE 0.9 % IV SOLN
30.0000 ug/min | INTRAVENOUS | Status: DC
  Filled 2017-03-03: qty 2

## 2017-03-03 MED ORDER — SODIUM CHLORIDE 0.9 % IV SOLN
1500.0000 mg | INTRAVENOUS | Status: AC
  Administered 2017-03-04: 1500 mg via INTRAVENOUS
  Filled 2017-03-03: qty 1500

## 2017-03-03 MED ORDER — NOREPINEPHRINE BITARTRATE 1 MG/ML IV SOLN
0.0000 ug/min | INTRAVENOUS | Status: DC
  Filled 2017-03-03: qty 4

## 2017-03-03 MED ORDER — MAGNESIUM SULFATE 50 % IJ SOLN
40.0000 meq | INTRAMUSCULAR | Status: DC
Start: 2017-03-04 — End: 2017-03-04
  Filled 2017-03-03: qty 10

## 2017-03-03 NOTE — Patient Instructions (Addendum)
   Continue taking all current medications without change through the day before surgery.  Have nothing to eat or drink after midnight the night before surgery.  On the morning of surgery take only Synthroid, Zantac and metoprolol with a sip of water.  You may use your inhaler(s)

## 2017-03-03 NOTE — Progress Notes (Signed)
[facility]  MULTIDISCIPLINARY HEART VALVE CLINIC  CARDIOTHORACIC SURGERY CONSULTATION REPORT  Referring Provider is Burnell Blanks, MD PCP is Laurey Morale, MD  Chief Complaint  Patient presents with  . Aortic Stenosis    2nd TAVR eval, reveiw all studies, surgery scheduled for 03/04/17    HPI:  Patient is an 83 year old male with history of coronary artery disease status post coronary artery bypass grafting in 2007, aortic stenosis, previous myocardial infarction with chronic combined systolic and diastolic congestive heart failure, hypertension, hypothyroidism, and prostate cancer with been referred for second surgical opinion to discuss treatment options for management of severe low gradient low ejection fraction aortic stenosis.  The patient's cardiac history dates back approximately 18 years ago when he suffered an acute inferior wall myocardial infarction. He was treated medically and did well until 2007 when he presented with symptoms of angina pectoris. He was found to have severe left main and three-vessel coronary artery disease and underwent coronary artery bypass grafting 4 by Dr. Servando Snare. Grafts placed at the time of surgery included left internal mammary artery to the distal left anterior descending coronary artery, saphenous vein graft to diagonal branch, saphenous vein graft to the obtuse marginal branch of left circumflex coronary artery, and saphenous vein graft to the distal right coronary artery. At the time of surgery the patient was noted to have sclerosis of the aortic valve with no significant aortic stenosis. The patient did well and has remained stable from a cardiac standpoint until fairly recently.  Over the last 2-3 years the patient has developed exertional shortness of breath and chronic lower extremity edema. Symptoms gradually progressed and have been more problematic over the last 6 months. Transthoracic echocardiograms have documented the  presence of aortic stenosis that up until recently has been felt to be moderate in severity. In January of this year he was seen in follow-up by Dr. Angelena Form.  Diagnostic cardiac catheterization was performed revealing severe native coronary artery disease with chronic occlusion of left main coronary artery and the right coronary artery. All of the previously placed bypass grafts remained widely patent and free of significant disease.  Mean transvalvular gradient measured across the aortic valve was reported 17 mmHg. The patient was treated medically. Since then he has had further progression of symptoms of congestive heart failure, and in May he was hospitalized with acute exacerbation of chronic symptoms with resting shortness of breath and severe volume overload. He improved with diuretic therapy. Follow-up transthoracic echocardiogram performed at that time revealed the presence of aortic stenosis with peak velocity across the aortic valve measured a size 3.8 m/s corresponding to mean transvalvular gradient estimated 27 mmHg and aortic valve area calculated 0.57 cm. The DVI was reportedly quite low at 0.18 and left ventricular ejection fraction had notably dropped 40-45%, having previously been estimated 55-60% on the last previous echocardiogram performed in October 2017. The patient was referred for surgical consultation and evaluated previously by Dr. Servando Snare who felt that the patient would be at relatively high risk for conventional surgical aortic valve replacement. Transcatheter aortic valve replacement was discussed as an alternative and the patient was referred for second surgical opinion.  The patient is married and lives locally in [place] with his wife. 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 He does not exercise on a regular basis although he states that he remains functionally independent and  overall doing well  for a gentleman his age. Since his last hospitalization the patient has done better on medical therapy. He states that he now gets short of breath only with more strenuous physical exertion. His weight has been stable on his current regimen of diuretics. He denies any resting shortness breath, PND, orthopnea, palpitations, dizzy spells, or syncope. He has chronic lower extremity edema. He has never had any chest tightness or chest pressure other with activity or at rest.  Past Medical History:  Diagnosis Date  . Age-related macular degeneration, dry, both eyes   . Allergic    "24/7; 365 days/year; I'm allergic to pollens, dust, all southern grasses/trees, mold, mildue, cats, dogs" (01/07/2017)  . Anginal pain ([place])   . Asthma    sees [name]   . Benign prostatic hypertrophy    (sees Dr. Denman George  . CAD (coronary artery disease)    sees Dr. Haroldine Laws   . Carotid bruit    carotid u/s 10/10: 0.39% bilaterally  . CHF (congestive heart failure) ([place])   . Complication of anesthesia 1980s   "w/anal cyst OR, he gave me a saddle block then put a narcotic in spinal cord; had a severe reaction to that" (01/07/2017)  . Coronary artery disease involving native coronary artery of native heart without angina pectoris   . Dyspnea    with fluid buildup  . ED (erectile dysfunction)   . Family history of adverse reaction to anesthesia    "daughter wakes up during OR" (01/07/2017)  . GERD (gastroesophageal reflux disease)   . Gout   . Heart murmur   . HTN (hypertension)   . Hx of colonic polyps    (sees Dr. Henrene Pastor)  . Hyperlipidemia   . Hypothyroidism   . Leaky heart valve   . Myocardial infarction ([place]) ~ 2000  . Obesity   . Osteoarthritis    "was in my knees, hands" (01/07/2017 )  . Precancerous skin lesion    (sees Dr. Allyson Sabal)  . Prostate cancer ([place]) dx'd ~ 2014  . S/P CABG (coronary artery bypass graft) 2007    Coronary artery bypass grafting x4 with left internal(Edward  Servando Snare, MD)  . 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    Past Surgical History:  Procedure Laterality Date  . ANUS SURGERY     "opened it back up cause it wouldn't heal; wound up w/a fissure" (01/07/2017)  . CARDIAC CATHETERIZATION  10/29/2005  . CARDIAC CATHETERIZATION N/A 08/27/2016   Procedure: Right/Left Heart Cath and Coronary/Graft Angiography;  Surgeon: Burnell Blanks, MD;  Location: [facility];  Service: Cardiovascular;  Laterality: N/A;  . CATARACT EXTRACTION W/ INTRAOCULAR LENS  IMPLANT, BILATERAL Bilateral   . COLONOSCOPY  06/30/2008   no repeats needed   . CORONARY ARTERY BYPASS GRAFT  2007   "CABG X4"  . CYST EXCISION PERINEAL  1980s  . HAMMER TOE SURGERY Bilateral   . JOINT REPLACEMENT    . KNEE ARTHROPLASTY  07/30/2011   Procedure: COMPUTER ASSISTED TOTAL KNEE ARTHROPLASTY;  Surgeon: Meredith Pel;  Location: [facility];  Service: Orthopedics;  Laterality: Left;  left total knee arthroplasty  . MASTECTOMY SUBCUTANEOUS Bilateral   . ORIF FINGER / THUMB FRACTURE Right ~ 1980   "repair of thumb injury"  . PROSTATE BIOPSY    . REPLACEMENT TOTAL KNEE BILATERAL Bilateral 2012    Family History  Problem Relation Age of Onset  . Heart attack Father 68  . Heart failure Mother 44  . Uterine cancer Mother  Social History   Social History  . Marital status: Married    Spouse name: N/A  . Number of children: N/A  . Years of education: N/A   Occupational History  . general contractor     builds malls   Social History Main Topics  . Smoking status: Former Smoker    Packs/day: 3.50    Years: 13.00    Types: Cigarettes    Quit date: 1963  . Smokeless tobacco: Never Used  . Alcohol use 0.0 oz/week     Comment: 01/07/2017 "might have 2 beers 2-3 times/month"  . Drug use: No  . Sexual activity: No   Other Topics Concern  . Not on file   Social History Narrative   FH of CAD, Male 1st degree relative less than age 61.    Current Outpatient Prescriptions    Medication Sig Dispense Refill  . aspirin 81 MG tablet Take 81 mg by mouth every evening.     [name] atorvastatin (LIPITOR) 20 MG tablet Take 20 mg by mouth daily.    . Azelastine-Fluticasone (DYMISTA) 137-50 MCG/ACT SUSP Place 2 puffs into the nose 2 (two) times daily. 1 Bottle 6  . benzonatate (TESSALON) 200 MG capsule Take 1 capsule (200 mg total) by mouth 2 (two) times daily as needed for cough. 60 capsule 5  . budesonide-formoterol (SYMBICORT) 160-4.5 MCG/ACT inhaler Inhale 2 puffs into the lungs 2 (two) times daily. 1 Inhaler 6  . budesonide-formoterol (SYMBICORT) 160-4.5 MCG/ACT inhaler Inhale 2 puffs into the lungs 2 (two) times daily.    . cetirizine (ZYRTEC) 10 MG tablet Take 10 mg by mouth daily.    . chlorpheniramine (CHLOR-TRIMETON) 4 MG tablet Take 2 tablets (8 mg total) by mouth 3 (three) times daily. (Patient taking differently: Take 8 mg by mouth 2 (two) times daily. ) 90 tablet 3  . furosemide (LASIX) 80 MG tablet Take 1 tablet (80 mg total) by mouth daily. 30 tablet 2  . isosorbide mononitrate (IMDUR) 30 MG 24 hr tablet Take 1 tablet (30 mg total) by mouth daily. 90 tablet 3  . levothyroxine (SYNTHROID, LEVOTHROID) 75 MCG tablet Take 1 tablet (75 mcg total) by mouth daily. 90 tablet 3  . losartan (COZAAR) 25 MG tablet Take 1 tablet (25 mg total) by mouth daily. 30 tablet 5  . metoprolol (LOPRESSOR) 50 MG tablet Take 1 tablet (50 mg total) by mouth 2 (two) times daily. 180 tablet 2  . montelukast (SINGULAIR) 10 MG tablet TAKE 1 TABLET IN THE EVENING. 60 tablet 5  . Multiple Vitamins-Minerals (PRESERVISION AREDS PO) Take 2 tablets by mouth every morning.    . potassium chloride SA (K-DUR,KLOR-CON) 20 MEQ tablet Take 2 tablets (40 mEq total) by mouth daily. 30 tablet 2  . ranitidine (ZANTAC) 150 MG capsule Take 150 mg by mouth 2 (two) times daily.     No current facility-administered medications for this visit.     Allergies  Allergen Reactions  . Amlodipine Swelling    Swelling  in ankles  . Peanut-Containing Drug Products Anaphylaxis  . Sulfonamide Derivatives Anaphylaxis  . Lisinopril Other (See Comments)    cough      Review of Systems:   General:  normal appetite, normal energy, no weight gain, + weight loss, no fever  Cardiac:  no chest pain with exertion, no chest pain at rest, + SOB with exertion, no resting SOB, no PND, no orthopnea, no palpitations, no arrhythmia, no atrial fibrillation, + LE edema, no dizzy spells,  no syncope  Respiratory:  + shortness of breath, no home oxygen, no productive cough, + dry cough, no bronchitis, + wheezing, no hemoptysis, no asthma, no pain with inspiration or cough, no sleep apnea, no CPAP at night  GI:   no difficulty swallowing, no reflux, no frequent heartburn, + hiatal hernia, no abdominal pain, no constipation, no diarrhea, no hematochezia, no hematemesis, no melena  GU:   no dysuria,  + frequency, no urinary tract infection, no hematuria, no enlarged prostate, no kidney stones, no kidney disease  Vascular:  no pain suggestive of claudication, no pain in feet, no leg cramps, no varicose veins, no DVT, no non-healing foot ulcer  Neuro:   no stroke, no TIA's, no seizures, no headaches, no temporary blindness one eye,  no slurred speech, no peripheral neuropathy, no chronic pain, no instability of gait, no memory/cognitive dysfunction  Musculoskeletal: no arthritis, no joint swelling, no myalgias, no difficulty walking, normal mobility   Skin:   no rash, no itching, no skin infections, no pressure sores or ulcerations  Psych:   no anxiety, no depression, no nervousness, no unusual recent stress  Eyes:   no blurry vision, no floaters, no recent vision changes, + wears glasses or contacts  ENT:   no hearing loss, no loose or painful teeth, no dentures, last saw dentist 02/04/2017  Hematologic:  no easy bruising, no abnormal bleeding, no clotting disorder, no frequent epistaxis  Endocrine:  no diabetes, does not check CBG's at  home           Physical Exam:   BP (!) 111/54   Pulse 70   Resp 20   Ht 5\' 7"  (1.702 m)   Wt 268 lb (121.6 kg)   SpO2 97% Comment: RA  BMI 41.97 kg/m   General:  Moderately obese,  well-appearing  HEENT:  Unremarkable   Neck:   no JVD, no bruits, no adenopathy   Chest:   clear to auscultation, symmetrical breath sounds, no wheezes, no rhonchi   CV:   RRR, grade IV/VI crescendo/decrescendo murmur heard best at RUSB,  no diastolic murmur  Abdomen:  soft, non-tender, no masses   Extremities:  warm, well-perfused, pulses palpable, mild LE edema  Rectal/GU  Deferred  Neuro:   Grossly non-focal and symmetrical throughout  Skin:   Clean and dry, no rashes, no breakdown   Diagnostic Tests:  Transthoracic Echocardiography  Patient:    Justin [name], Justin [name] MR #:       [id number] Study Date: 01/09/2017 Gender:     M Age:        82 Height:     170.2 cm Weight:     124.7 kg BSA:        2.49 m^2 Pt. Status: Room:       3E16C   ATTENDING    Darlina Guys, MD  ADMITTING    Sonny Dandy, [place], [place], Inpatient  SONOGRAPHER  Alvino Chapel, RCS  cc:  ------------------------------------------------------------------- LV EF: 40% -   45%  ------------------------------------------------------------------- Indications:      CHF - 428.0.  ------------------------------------------------------------------- History:   PMH:   Coronary artery disease.  Aortic valve disease. PMH:   Myocardial infarction.  Risk factors:  Hypertension.  ------------------------------------------------------------------- Study Conclusions  - Left ventricle: The cavity size was normal. There was moderate   concentric hypertrophy. Systolic function was mildly to   moderately reduced. The  estimated ejection fraction was in the   range of 40% to 45%. Wall motion was normal; there were no   regional wall  motion abnormalities. Features are consistent with   a pseudonormal left ventricular filling pattern, with concomitant   abnormal relaxation and increased filling pressure (grade 2   diastolic dysfunction). Doppler parameters are consistent with   elevated ventricular end-diastolic filling pressure. - Ventricular septum: Septal motion showed paradox. - Aortic valve: Valve mobility was restricted. There was moderate   to severe stenosis. There was mild regurgitation. Mean gradient   (S): 27 mm Hg. Peak gradient (S): 58 mm Hg. Valve area (VTI):   0.63 cm^2. Valve area (Vmax): 0.57 cm^2. Valve area (Vmean): 0.66   cm^2. - Mitral valve: Calcified annulus. Mildly thickened leaflets . - Left atrium: The atrium was mildly dilated. - Right ventricle: The cavity size was mildly dilated. Wall   thickness was normal. Systolic function was moderately reduced.  Impressions:  - When compared to th eprior study from 05/31/2016 LVEF has   decreased from 55-60% to 40-45%.   Aortic valve is severely calcified and thickened with severely   restricted leaflet openings. Transaortic gradient consistent with   moderate stenosis, however AVA consistent with severe aortic   stenosis. Consider a low Dobutamine stress echocardiogram for   further evaluation.  ------------------------------------------------------------------- Study data:  Comparison was made to the study of 05/31/2016.  Study status:  Routine.  Procedure:  The patient reported no pain pre or post test. Transthoracic echocardiography. Image quality was fair. The study was technically difficult, as a result of poor sound wave transmission and body habitus. Intravenous contrast (Definity) was administered.  Study completion:  There were no complications.     Transthoracic echocardiography.  M-mode, complete 2D, spectral Doppler, and color Doppler.  Birthdate:  Patient birthdate: Jun 29, 1934.  Age:  Patient is 83 yr old.  Sex:  Gender:  male. BMI: 43 kg/m^2.  Blood pressure:     120/51  Patient status: Inpatient.  Study date:  Study date: 01/09/2017. Study time: 09:29 AM.  Location:  Bedside.  -------------------------------------------------------------------  ------------------------------------------------------------------- Left ventricle:  The cavity size was normal. There was moderate concentric hypertrophy. Systolic function was mildly to moderately reduced. The estimated ejection fraction was in the range of 40% to 45%. Wall motion was normal; there were no regional wall motion abnormalities. Features are consistent with a pseudonormal left ventricular filling pattern, with concomitant abnormal relaxation and increased filling pressure (grade 2 diastolic dysfunction). Doppler parameters are consistent with elevated ventricular end-diastolic filling pressure.  ------------------------------------------------------------------- Aortic valve:   Trileaflet; severely thickened, severely calcified leaflets. Valve mobility was restricted.  Doppler:   There was moderate to severe stenosis.   There was mild regurgitation.    VTI ratio of LVOT to aortic valve: 0.2. Valve area (VTI): 0.63 cm^2. Indexed valve area (VTI): 0.25 cm^2/m^2. Peak velocity ratio of LVOT to aortic valve: 0.18. Valve area (Vmax): 0.57 cm^2. Indexed valve area (Vmax): 0.23 cm^2/m^2. Mean velocity ratio of LVOT to aortic valve: 0.21. Valve area (Vmean): 0.66 cm^2. Indexed valve area (Vmean): 0.27 cm^2/m^2.    Mean gradient (S): 27 mm Hg. Peak gradient (S): 58 mm Hg.  ------------------------------------------------------------------- Aorta:  Aortic root: The aortic root was normal in size.  ------------------------------------------------------------------- Mitral valve:   Calcified annulus. Mildly thickened leaflets . Mobility was not restricted.  Doppler:  Transvalvular velocity was within the normal range. There was no evidence for  stenosis. There was trivial regurgitation.    Valve  area by pressure half-time: 3.73 cm^2. Indexed valve area by pressure half-time: 1.5 cm^2/m^2.   Peak gradient (D): 6 mm Hg.  ------------------------------------------------------------------- Left atrium:  The atrium was mildly dilated.  ------------------------------------------------------------------- Right ventricle:  The cavity size was mildly dilated. Wall thickness was normal. Systolic function was moderately reduced.  ------------------------------------------------------------------- Ventricular septum:   Septal motion showed paradox.  ------------------------------------------------------------------- Pulmonic valve:    Structurally normal valve.   Cusp separation was normal.  Doppler:  Transvalvular velocity was within the normal range. There was no evidence for stenosis. There was trivial regurgitation.  ------------------------------------------------------------------- Tricuspid valve:   Structurally normal valve.    Doppler: Transvalvular velocity was within the normal range. There was trivial regurgitation.  ------------------------------------------------------------------- Pulmonary artery:   The main pulmonary artery was normal-sized. Systolic pressure was within the normal range.  ------------------------------------------------------------------- Right atrium:  The atrium was normal in size.  ------------------------------------------------------------------- Pericardium:  There was no pericardial effusion.  ------------------------------------------------------------------- Systemic veins: Inferior vena cava: The vessel was dilated. The respirophasic diameter changes were blunted (< 50%), consistent with elevated central venous pressure.  ------------------------------------------------------------------- Measurements   Left ventricle                          Value           Reference   LV ID, ED, PLAX chordal                 49     mm       43 - 52  LV ID, ES, PLAX chordal                 30     mm       23 - 38  LV fx shortening, PLAX chordal          39     %        >=29  LV PW thickness, ED                     12     mm       ----------  IVS/LV PW ratio, ED                     1.08            <=1.3  Stroke volume, 2D                       58     ml       ----------  Stroke volume/bsa, 2D                   23     ml/m^2   ----------  LV ejection fraction, 1-p A4C           56     %        ----------  LV end-diastolic volume, 2-p            126    ml       ----------  LV end-systolic volume, 2-p             55     ml       ----------  LV ejection fraction, 2-p               57     %        ----------  Stroke volume, 2-p                      72     ml       ----------  LV end-diastolic volume/bsa,            51     ml/m^2   ----------  2-p  LV end-systolic volume/bsa, 2-p         22     ml/m^2   ----------  Stroke volume/bsa, 2-p                  28.7   ml/m^2   ----------  LV e&', lateral                          7.05   cm/s     ----------  LV E/e&', lateral                        17.59           ----------  LV e&', medial                           3.73   cm/s     ----------  LV E/e&', medial                         33.24           ----------  LV e&', average                          5.39   cm/s     ----------  LV E/e&', average                        23.01           ----------  LV ejection time                        410    ms       ----------    Ventricular septum                      Value           Reference  IVS thickness, ED                       13     mm       ----------    LVOT                                    Value           Reference  LVOT ID, S                              20     mm       ----------  LVOT area                               3.14   cm^2     ----------  LVOT peak velocity, S                   70.2   cm/s     ----------  LVOT mean  velocity, S                   48.7   cm/s     ----------  LVOT VTI, S                             18.4   cm       ----------  LVOT peak gradient, S                   2      mm Hg    ----------    Aortic valve                            Value           Reference  Aortic valve peak velocity, S           380.04 cm/s     ----------  Aortic valve mean velocity, S           230.19 cm/s     ----------  Aortic valve VTI, S                     93.21  cm       ----------  Aortic mean gradient, S                 27     mm Hg    ----------  Aortic peak gradient, S                 58     mm Hg    ----------  VTI ratio, LVOT/AV                      0.2             ----------  Aortic valve area, VTI                  0.63   cm^2     ----------  Aortic valve area/bsa, VTI              0.25   cm^2/m^2 ----------  Velocity ratio, peak, LVOT/AV           0.18            ----------  Aortic valve area, peak                 0.57   cm^2     ----------  velocity  Aortic valve area/bsa, peak             0.23   cm^2/m^2 ----------  velocity  Velocity ratio, mean, LVOT/AV           0.21            ----------  Aortic valve area, mean                 0.66   cm^2     ----------  velocity  Aortic valve area/bsa, mean             0.27   cm^2/m^2 ----------  velocity  Aortic regurg pressure  628    ms       ----------  half-time    Aorta                                   Value           Reference  Aortic root ID, ED                      36     mm       ----------    Left atrium                             Value           Reference  LA ID, A-P, ES                          44     mm       ----------  LA ID/bsa, A-P                          1.77   cm/m^2   <=2.2  LA volume, S                            97.4   ml       ----------  LA volume/bsa, S                        39.1   ml/m^2   ----------  LA volume, ES, 1-p A4C                  82.3   ml       ----------  LA volume/bsa, ES, 1-p A4C               33     ml/m^2   ----------  LA volume, ES, 1-p A2C                  106    ml       ----------  LA volume/bsa, ES, 1-p A2C              42.5   ml/m^2   ----------    Mitral valve                            Value           Reference  Mitral E-wave peak velocity             124    cm/s     ----------  Mitral A-wave peak velocity             34.4   cm/s     ----------  Mitral deceleration time                201    ms       150 - 230  Mitral pressure half-time               59     ms       ----------  Mitral peak gradient, D  6      mm Hg    ----------  Mitral E/A ratio, peak                  3.6             ----------  Mitral valve area, PHT, DP              3.73   cm^2     ----------  Mitral valve area/bsa, PHT, DP          1.5    cm^2/m^2 ----------    Pulmonary arteries                      Value           Reference  PA pressure, S, DP                      20     mm Hg    <=30    Tricuspid valve                         Value           Reference  Tricuspid regurg peak velocity          176    cm/s     ----------  Tricuspid peak RV-RA gradient           12     mm Hg    ----------    Right atrium                            Value           Reference  RA ID, S-I, ES, A4C             (H)     69.5   mm       34 - 49  RA area, ES, A4C                (H)     23.3   cm^2     8.3 - 19.5  RA volume, ES, A/L                      64.9   ml       ----------  RA volume/bsa, ES, A/L                  26     ml/m^2   ----------    Systemic veins                          Value           Reference  Estimated CVP                           8      mm Hg    ----------    Right ventricle                         Value           Reference  RV ID, ED, PLAX  35     mm       19 - 38  TAPSE                                   20.9   mm       ----------  RV pressure, S, DP                      20     mm Hg    <=30  RV s&', lateral, S                       10.4   cm/s      ----------  Legend: (L)  and  (H)  mark values outside specified reference range.  ------------------------------------------------------------------- Prepared and Electronically Authenticated by  Ena Dawley, M.D. 2018-05-17T12:12:11  Transthoracic Echocardiography  Patient:    Zyion, Doxtater MR #:       [id number] Study Date: 05/31/2016 Gender:     M Age:        40 Height:     172.7 cm Weight:     126.2 kg BSA:        2.52 m^2 Pt. Status: Room:   SONOGRAPHER  Wyatt Mage, RDCS  ATTENDING    Sonny Dandy, Christopher  REFERRING    McAlhany, Christopher  PERFORMING   [facility]  cc:  ------------------------------------------------------------------- LV EF: 55% -   60%  ------------------------------------------------------------------- Indications:      Aortic stenosis (I35).  ------------------------------------------------------------------- History:   PMH:   Murmur.  Coronary artery disease.  Aortic valve disease.  PMH:   Myocardial infarction.  Risk factors:  Former tobacco use. Hypertension. Dyslipidemia.  ------------------------------------------------------------------- Study Conclusions  - Procedure narrative: Transthoracic echocardiography. Image   quality was suboptimal. The study was technically difficult.   Intravenous contrast (Definity) was administered. - Left ventricle: The cavity size was normal. Wall thickness was   increased in a pattern of moderate LVH. Systolic function was   normal. The estimated ejection fraction was in the range of 55%   to 60%. Doppler parameters are consistent with a reversible   restrictive pattern, indicative of decreased left ventricular   diastolic compliance and/or increased left atrial pressure (grade   3 diastolic dysfunction). Doppler parameters are consistent with   high ventricular filling pressure. - Aortic valve: AV is thickened, calcified with  restricted motion   Difficult to see well Peak and mean gradients through the valve   are 41 and 28 mm Hg respectively consistent with moderate AS - Mitral valve: Calcified annulus. Mildly thickened leaflets . - Left atrium: The atrium was mildly dilated.  Impressions:  - COmpared to report of echo from 2016 there is no significant   change.  ------------------------------------------------------------------- Labs, prior tests, procedures, and surgery: Transthoracic echocardiography (02/06/2015).    The aortic valve showed moderate stenosis.  EF was 60%.  Coronary artery bypass grafting.  ------------------------------------------------------------------- Study data:  Comparison was made to the study of 02/06/2015.  Study status:  Routine.  Procedure:  The patient reported no pain pre or post test. Transthoracic echocardiography. Image quality was suboptimal. The study was technically difficult, as a result of poor sound wave transmission and body habitus. Intravenous contrast (Definity) was administered.  Study completion:  There were no complications.  Transthoracic echocardiography.  M-mode, complete 2D, spectral Doppler, and color Doppler.  Birthdate: Patient birthdate: [birth year].  Age:  Patient is 83 yr old.  Sex: Gender: male.    BMI: 42.3 kg/m^2.  Blood pressure:     124/60 Patient status:  Outpatient.  Study date:  Study date: 05/31/2016. Study time: 11:32 AM.  Location:  [place] [facility]  -------------------------------------------------------------------  ------------------------------------------------------------------- Left ventricle:  The cavity size was normal. Wall thickness was increased in a pattern of moderate LVH. Systolic function was normal. The estimated ejection fraction was in the range of 55% to 60%. Doppler parameters are consistent with a reversible restrictive pattern, indicative of decreased left ventricular diastolic compliance  and/or increased left atrial pressure (grade 3 diastolic dysfunction). Doppler parameters are consistent with high ventricular filling pressure.  ------------------------------------------------------------------- Aortic valve:  AV is thickened, calcified with restricted motion Difficult to see well Peak and mean gradients through the valve are 41 and 28 mm Hg respectively consistent with moderate AS  Doppler: There was no significant regurgitation.    VTI ratio of LVOT to aortic valve: 0.33. Valve area (VTI): 1.35 cm^2. Indexed valve area (VTI): 0.54 cm^2/m^2. Peak velocity ratio of LVOT to aortic valve: 0.33. Valve area (Vmax): 1.35 cm^2. Indexed valve area (Vmax): 0.54 cm^2/m^2. Mean velocity ratio of LVOT to aortic valve: 0.31. Valve area (Vmean): 1.28 cm^2. Indexed valve area (Vmean): 0.51 cm^2/m^2.    Mean gradient (S): 27 mm Hg. Peak gradient (S): 41 mm Hg.  ------------------------------------------------------------------- Aorta:  The aorta was normal, not dilated, and non-diseased.  ------------------------------------------------------------------- Mitral valve:   Calcified annulus. Mildly thickened leaflets . Doppler:  There was trivial regurgitation.    Peak gradient (D): 6 mm Hg.  ------------------------------------------------------------------- Left atrium:  The atrium was mildly dilated.  ------------------------------------------------------------------- Right ventricle:  The cavity size was normal. Wall thickness was normal. Systolic function was normal.  ------------------------------------------------------------------- Pulmonic valve:    Structurally normal valve.   Cusp separation was normal.  Doppler:  Transvalvular velocity was within the normal range. There was mild regurgitation.  ------------------------------------------------------------------- Tricuspid valve:   Structurally normal valve.   Leaflet separation was normal.  Doppler:   Transvalvular velocity was within the normal range. There was trivial regurgitation.  ------------------------------------------------------------------- Right atrium:  The atrium was normal in size.  ------------------------------------------------------------------- Pericardium:  There was no pericardial effusion.   ------------------------------------------------------------------- Post procedure conclusions Ascending Aorta:  - The aorta was normal, not dilated, and non-diseased.  ------------------------------------------------------------------- Measurements   Left ventricle                            Value          Reference  LV ID, ED, PLAX chordal           (L)     41.6  mm       43 - 52  LV ID, ES, PLAX chordal                   29.5  mm       23 - 38  LV fx shortening, PLAX chordal            29    %        >=29  LV PW thickness, ED                       14.7  mm       ---------  IVS/LV PW ratio, ED                       1.08           <=1.3  Stroke volume, 2D                         117   ml       ---------  Stroke volume/bsa, 2D                     46    ml/m^2   ---------  LV e&', lateral                            9.32  cm/s     ---------  LV E/e&', lateral                          13.09          ---------  LV e&', medial                             6.57  cm/s     ---------  LV E/e&', medial                           18.57          ---------  LV e&', average                            7.95  cm/s     ---------  LV E/e&', average                          15.36          ---------    Ventricular septum                        Value          Reference  IVS thickness, ED                         15.9  mm       ---------    LVOT                                      Value          Reference  LVOT ID, S                                23    mm       ---------  LVOT area                                 4.15  cm^2     ---------  LVOT peak velocity, S                      104   cm/s     ---------  LVOT mean velocity, S                     75    cm/s     ---------  LVOT VTI, S                               28.1  cm       ---------    Aortic valve                              Value          Reference  Aortic valve peak velocity, S             320   cm/s     ---------  Aortic valve mean velocity, S             244   cm/s     ---------  Aortic valve VTI, S                       86.2  cm       ---------  Aortic mean gradient, S                   27    mm Hg    ---------  Aortic peak gradient, S                   41    mm Hg    ---------  VTI ratio, LVOT/AV                        0.33           ---------  Aortic valve area, VTI                    1.35  cm^2     ---------  Aortic valve area/bsa, VTI                0.54  cm^2/m^2 ---------  Velocity ratio, peak, LVOT/AV             0.33           ---------  Aortic valve area, peak velocity          1.35  cm^2     ---------  Aortic valve area/bsa, peak               0.54  cm^2/m^2 ---------  velocity  Velocity ratio, mean, LVOT/AV             0.31           ---------  Aortic valve area, mean velocity          1.28  cm^2     ---------  Aortic valve area/bsa, mean               0.51  cm^2/m^2 ---------  velocity    Aorta                                     Value          Reference  Aortic root ID, ED  37    mm       ---------  Ascending aorta ID, A-P, S                37    mm       ---------    Left atrium                               Value          Reference  LA ID, A-P, ES                            56    mm       ---------  LA ID/bsa, A-P                    (H)     2.22  cm/m^2   <=2.2  LA volume, S                              83.4  ml       ---------  LA volume/bsa, S                          33.1  ml/m^2   ---------  LA volume, ES, 1-p A4C                    66.6  ml       ---------  LA volume/bsa, ES, 1-p A4C                26.4  ml/m^2   ---------  LA volume, ES, 1-p A2C                     100   ml       ---------  LA volume/bsa, ES, 1-p A2C                39.6  ml/m^2   ---------    Mitral valve                              Value          Reference  Mitral E-wave peak velocity               122   cm/s     ---------  Mitral A-wave peak velocity               52.5  cm/s     ---------  Mitral deceleration time                  155   ms       150 - 230  Mitral peak gradient, D                   6     mm Hg    ---------  Mitral E/A ratio, peak                    2.3            ---------    Systemic veins  Value          Reference  Estimated CVP                             3     mm Hg    ---------    Right ventricle                           Value          Reference  TAPSE                                     9.76  mm       ---------  RV s&', lateral, S                         5.4   cm/s     ---------    Pulmonic valve                            Value          Reference  Pulmonic regurg velocity, ED              148   cm/s     ---------  Pulmonic regurg gradient, ED              9     mm Hg    ---------  Legend: (L)  and  (H)  mark values outside specified reference range.  ------------------------------------------------------------------- Prepared and Electronically Authenticated by  [name], M.D. 2017-10-06T16:28:47   Right/Left Heart Cath and Coronary/Graft Angiography  Conclusion     There is moderate aortic valve stenosis.  SVG graft was visualized by angiography and is normal in caliber.  1st Mrg lesion, 65 %stenosed.  Ost LM to LM lesion, 100 %stenosed.  SVG graft was visualized by angiography and is normal in caliber.  LIMA graft was visualized by angiography and is normal in caliber.  Prox RCA to Mid RCA lesion, 100 %stenosed.  SVG graft was visualized by angiography and is normal in caliber and anatomically normal.  Hemodynamic findings consistent with mild pulmonary hypertension.   1. Severe triple  vessel CAD with occluded left main and occluded mid RCA s/p 4V CABG with 4/4 patent bypass grafts.  2. The Left main is occluded at the ostium.  3. The LAD fills from the patent IMA graft and the Diagonal fills from the patent vein graft 4. The Circumflex fills from the patent vein graft to the OM. There is a moderate stenosis in the proximal segment of the OM branch proximal to the insertion of the vein graft.  5. The distal RCA fills from the patent vein graft.  6. Moderate AS 7. Elevated filling pressures  Recommendations: Will continue medical management of CAD. Will start Lasix 20 mg daily for mild volume overload. No indication for AVR at this time.    Indications   Coronary artery disease involving native coronary artery of native heart with unstable angina pectoris ([place]) [I25.110 (ICD-10-CM)]  Procedural Details/Technique   Technical Details Indication: 83 yo male with history of CAD s/p CABG in 2007, moderate AS with recent worsened dyspnea with exertion.   Procedure: The risks, benefits, complications, treatment options, and expected outcomes  were discussed with the patient. The patient and/or family concurred with the proposed plan, giving informed consent. The patient was brought to the cath lab after IV hydration was begun and oral premedication was given. The patient was further sedated with Versed and Fentanyl. The right groin was prepped and draped in the usual manner. Using the modified Seldinger access technique, a 5 French sheath was placed in the right femoral artery. A 7 French sheath was placed in the right femoral vein. Right heart catheterization performed with a balloon tipped catheter. Standard diagnostic catheters were used to perform selective coronary angiography. I engaged the RCA and both left sided vein grafts with the JR4 catheter. I engaged the vein graft to the PDA with a multi-purpose catheter. I could not engage the left main which appeared to be occluded at  the ostium. I non-selectively imaged the LIMA graft with a LIMA catheter. I easily crossed the aortic valve with the JR4 catheter. There were no immediate complications. The patient was taken to the recovery area in stable condition.    Estimated blood loss <50 mL.  During this procedure the patient was administered the following to achieve and maintain moderate conscious sedation: Versed 1 mg, Fentanyl 25 mcg, while the patient's heart rate, blood pressure, and oxygen saturation were continuously monitored. The period of conscious sedation was 49 minutes, of which I was present face-to-face 100% of this time.    Complications   Complications documented before study signed (08/27/2016 11:42 AM EST)    RIGHT/LEFT HEART CATH AND CORONARY/GRAFT ANGIOGRAPHY   None Documented by Burnell Blanks, MD 08/27/2016 11:41 AM EST  Time Range: Intra-procedure      Coronary Findings   Dominance: Right  Left Main  Ost LM to LM lesion, 100% stenosed. The lesion is chronically occluded.  Left Anterior Descending  First Diagonal Branch  Vessel is moderate in size.  Second Diagonal Branch  Vessel is small in size.  Third Diagonal Branch  Vessel is small in size.  Left Circumflex  First Obtuse Marginal Branch  Vessel is large in size.  1st Mrg lesion, 65% stenosed.  Second Obtuse Marginal Branch  Vessel is small in size.  Third Obtuse Marginal Branch  Vessel is moderate in size.  Right Coronary Artery  Vessel is large.  Prox RCA to Mid RCA lesion, 100% stenosed. The lesion is chronically occluded.  Right Posterior Descending Artery  Vessel is moderate in size.  Graft Angiography  saphenous Graft to 1st Mrg  SVG graft was visualized by angiography and is normal in caliber.  saphenous Graft to 1st Diag  SVG graft was visualized by angiography and is normal in caliber.  Free LIMA Graft to Dist LAD  LIMA graft was visualized by angiography and is normal in caliber.  saphenous Graft to  RPDA  SVG graft was visualized by angiography and is normal in caliber and anatomically normal.  Right Heart   Right Heart Pressures Hemodynamic findings consistent with mild pulmonary hypertension. Elevated LV EDP consistent with volume overload.    Left Heart   Aortic Valve There is moderate aortic valve stenosis.    Coronary Diagrams   Diagnostic Diagram       Implants     No implant documentation for this case.  PACS Images   Show images for Cardiac catheterization   Link to Procedure Log   Procedure Log    Hemo Data    Most Recent Value  Fick Cardiac Output 5.34 L/min  Fick  Cardiac Output Index 2.25 (L/min)/BSA  Aortic Mean Gradient 17 mmHg  Aortic Peak Gradient 21 mmHg  Aortic Valve Area 1.61  Aortic Value Area Index 0.68 cm2/BSA  RA A Wave 20 mmHg  RA V Wave 20 mmHg  RA Mean 18 mmHg  RV Systolic Pressure 54 mmHg  RV Diastolic Pressure 12 mmHg  RV EDP 16 mmHg  PA Systolic Pressure 44 mmHg  PA Diastolic Pressure 20 mmHg  PA Mean 32 mmHg  PW A Wave 28 mmHg  PW V Wave 42 mmHg  PW Mean 25 mmHg  AO Systolic Pressure 341 mmHg  AO Diastolic Pressure 55 mmHg  AO Mean 88 mmHg  LV Systolic Pressure 962 mmHg  LV Diastolic Pressure 3 mmHg  LV EDP 25 mmHg  Arterial Occlusion Pressure Extended Systolic Pressure 229 mmHg  Arterial Occlusion Pressure Extended Diastolic Pressure 60 mmHg  Arterial Occlusion Pressure Extended Mean Pressure 87 mmHg  Left Ventricular Apex Extended Systolic Pressure 798 mmHg  Left Ventricular Apex Extended Diastolic Pressure 12 mmHg  Left Ventricular Apex Extended EDP Pressure 29 mmHg  QP/QS 1  TPVR Index 14.2 HRUI  TSVR Index 39.06 HRUI  PVR SVR Ratio 0.1  TPVR/TSVR Ratio 0.36     Cardiac TAVR CT  TECHNIQUE: The patient was scanned on a Philips 256 scanner. A 120 kV retrospective scan was triggered in the descending thoracic aorta at 111 HU's. Gantry rotation speed was 270 msecs and collimation was .9 mm. No beta blockade  or nitro were given. The 3D data set was reconstructed in 5% intervals of the R-R cycle. Systolic and diastolic phases were analyzed on a dedicated work station using MPR, MIP and VRT modes. The patient received 80 cc of contrast.  FINDINGS: Aortic Valve: Trileaflet, severely thickened, moderately calcified aortic valve with minimal calcifications extending into the LVOT.  Aorta: Normal size, moderate diffuse calcifications, diffuse atherosclerotic plaque, focal severe protruding plaque in the aortic arch.  Sinotubular Junction:  31 x 31 mm  Ascending Thoracic Aorta:  36 x 36 mm  Aortic Arch:  30 x 26 mm  Descending Thoracic Aorta:  26 x 26 mm  Sinus of Valsalva Measurements:  Non-coronary:  37 mm  Right -coronary:  36 mm  Left -coronary:  36 mm  Coronary Artery Height above Annulus:  Left Main:  13 mm  Right Coronary:  15 mm  Virtual Basal Annulus Measurements:  Maximum/Minimum Diameter:  31 x 25 mm  Perimeter:  90 mm  Area:  619 mm2  Optimum Fluoroscopic Angle for Delivery:  LAO 9 CAU 7  IMPRESSION: 1. Trileaflet, severely thickened, moderately calcified aortic valve with minimal calcifications extending into the LVOT. Annular measurements suitable for delivery of a 29 mm Edwards-SAPIEN 3 valve.  2. Sufficient annulus to coronary distance.  3. Optimum Fluoroscopic Angle for Delivery:  LAO 9 CAU 7  4. A large left atrial appendage has no evidence for a thrombus.  5. Dilated pulmonary artery measuring 35 x 31 mm suggestive of pulmonary hypertension.  Ena Dawley   Electronically Signed   By: Ena Dawley   On: 01/28/2017 13:16   CT ANGIOGRAPHY CHEST, ABDOMEN AND PELVIS  TECHNIQUE: Multidetector CT imaging through the chest, abdomen and pelvis was performed using the standard protocol during bolus administration of intravenous contrast. Multiplanar reconstructed images and MIPs were obtained and reviewed to  evaluate the vascular anatomy.  CONTRAST:  70 mL of Isovue 370.  COMPARISON:  No priors.  FINDINGS: CTA CHEST FINDINGS  Cardiovascular: Heart  size is mildly enlarged. There is no significant pericardial fluid, thickening or pericardial calcification. There is aortic atherosclerosis, as well as atherosclerosis of the great vessels of the mediastinum and the coronary arteries, including calcified atherosclerotic plaque in the left main, left anterior descending, left circumflex and right coronary arteries. Status post median sternotomy for CABG including LIMA to the LAD. Thickening calcification of the aortic valve.  Mediastinum/Lymph Nodes: No pathologically enlarged mediastinal or hilar lymph nodes. Small hiatal hernia. No axillary lymphadenopathy.  Lungs/Pleura: Mild scarring in the periphery of the left upper lobe. No acute consolidative airspace disease. No pleural effusions. No suspicious appearing pulmonary nodules or masses.  Musculoskeletal/Soft Tissues: Median sternotomy wires. There are no aggressive appearing lytic or blastic lesions noted in the visualized portions of the skeleton.  CTA ABDOMEN AND PELVIS FINDINGS  Hepatobiliary: No cystic or solid hepatic lesions. No intra or extrahepatic biliary ductal dilatation. Gallbladder is normal in appearance.  Pancreas: No pancreatic mass. No pancreatic ductal dilatation. No pancreatic or peripancreatic fluid or inflammatory changes.  Spleen: Unremarkable.  Adrenals/Urinary Tract: Bilateral kidneys and bilateral adrenal glands are normal in appearance. No hydroureteronephrosis. Urinary bladder is normal in appearance.  Stomach/Bowel: The appearance of the stomach is normal. There is no pathologic dilatation of small bowel or colon. Numerous colonic diverticulae are noted, particularly in the sigmoid colon, without surrounding inflammatory changes to suggest an acute diverticulitis at this time. Normal  appendix.  Vascular/Lymphatic: Aortic atherosclerosis, with vascular findings and measurements pertinent to potential TAVR procedure, as detailed below. No aneurysm or dissection identified in the abdominal or pelvic vasculature. Densely calcified plaque at the origin of the celiac axis, the appearance of which suggests the presence of hemodynamically significant stenosis (because of the degree of calcification, accurate assessment of severity of stenosis is not possible on today's examination). Similarly, there is a densely calcified plaque at the ostium of the superior mesenteric artery, which also likely causes significant stenosis which is difficult to assess because of the severity of calcification. Inferior mesenteric artery appears to be widely patent without hemodynamically significant stenosis. Single renal arteries bilaterally. Left renal artery is widely patent without hemodynamically significant stenosis. Mild to moderate stenosis at the ostium of the right renal artery secondary to eccentric calcified plaque. No lymphadenopathy noted in the abdomen or pelvis.  Reproductive: Prostate gland and seminal vesicles are unremarkable in appearance.  Other: No significant volume of ascites.  No pneumoperitoneum.  Musculoskeletal: There are no aggressive appearing lytic or blastic lesions noted in the visualized portions of the skeleton.  VASCULAR MEASUREMENTS PERTINENT TO TAVR:  AORTA:  Minimal Aortic Diameter -  16 x 17 mm  Severity of Aortic Calcification -  severe  RIGHT PELVIS:  Right Common Iliac Artery -  Minimal Diameter - 13.4 x 13.4 mm  Tortuosity - mild  Calcification - mild  Right External Iliac Artery -  Minimal Diameter - 8.9 x 9.7 mm  Tortuosity - mild  Calcification - none  Right Common Femoral Artery -  Minimal Diameter - 9.8 x 8.5 mm  Tortuosity - mild  Calcification - mild  LEFT PELVIS:  Left Common Iliac  Artery -  Minimal Diameter - 14.6 x 8.8 mm  Tortuosity - mild  Calcification - mild  Left External Iliac Artery -  Minimal Diameter - 9.8 x 9.6 mm  Tortuosity - mild  Calcification - none  Left Common Femoral Artery -  Minimal Diameter - 9.4 x 9.3 mm  Tortuosity - mild  Calcification - mild  Review of the MIP images confirms the above findings.  IMPRESSION: 1. Vascular findings and measurements pertinent to potential TAVR procedure, as detailed above. This patient does appear to have suitable pelvic arterial access bilaterally. 2. Thickening calcification of the aortic valve, compatible with the reported clinical history of severe aortic stenosis. 3. Aortic atherosclerosis, in addition to left main and 3 vessel coronary artery disease. Status post median sternotomy for CABG including LIMA to the LAD. 4. 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 5. Extensive colonic diverticulosis without evidence to suggest an acute diverticulitis at this time. 6. Additional incidental findings, as above.   Electronically Signed   By: Vinnie Langton M.D.   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 He presents with a long history of symptoms of exertional shortness of breath and lower extremity edema consistent with chronic combined systolic and diastolic congestive heart failure. He was hospitalized in early May with an acute  exacerbation of symptoms at rest consistent with class IV CHF. Over the last several weeks he has been doing better on medical therapy but he continues to experience exertional shortness of breath and lower extremity edema consistent with class II CHF. I have personally reviewed the patient's recent transthoracic echocardiogram, diagnostic cardiac catheterization, and CT angiograms. Echocardiogram performed in May of this year demonstrates severe thickening, calcification, and restricted leaflet mobility involving all 3 leaflets of the patient's aortic valve. Peak velocity across the aortic valve was measured as high as 3.8 m/s corresponding to mean transvalvular gradient estimated 27 mmHg. The DVI was measured quite low at 0.18.  Although the peak velocity was slightly less than 4 m/s and the mean transvalvular gradient remained less than 40 mmHg, the patient's ejection fraction dropped significantly over the past 6 months. I agree that this likely represents the presence of low gradient severe symptomatic aortic stenosis, and I share Dr. Camillia Herter concerns regarding whether or not it would be wise or necessary to perform dobutamine stress echocardiography under the circumstances. Diagnostic cardiac catheterization performed last January confirmed the presence of severe native vessel coronary artery disease with chronic occlusion of left main coronary artery and the right coronary artery. However, all of the bypass grafts placed at the time of the patient's coronary artery bypass surgery in 2017 remained widely patent and free of significant disease.  Under the circumstances, I agree that the patient would likely benefit from aortic valve replacement. Risks associated with conventional surgery would be at least moderately elevated because of the patient's age, moderately reduced left ventricular function, and previous coronary artery bypass surgery.  Cardiac-gated CTA of the heart reveals anatomical  characteristics consistent with aortic stenosis suitable for treatment by transcatheter aortic valve replacement without any significant complicating features and CTA of the aorta and iliac vessels demonstrate what appears to be adequate pelvic vascular access to facilitate a transfemoral approach.   Plan:  The  patient and his family were counseled at length regarding treatment alternatives for management of severe symptomatic aortic stenosis. Alternative approaches such as conventional aortic valve replacement, transcatheter aortic valve replacement, and palliative medical therapy were compared and contrasted at length.  The risks associated with conventional surgical aortic valve replacement were been discussed in detail, as were expectations for post-operative convalescence, and why I would be reluctant to consider this patient a candidate for conventional surgery.  Issues specific to transcatheter aortic valve replacement were discussed including questions about long term valve durability, the potential for paravalvular leak, possible increased risk of need for permanent pacemaker placement, and other technical complications related to the procedure itself.  Long-term prognosis with medical therapy was discussed. This discussion was placed in the context of the patient's own specific clinical presentation and past medical history.  All of their questions been addressed.  Following the decision to proceed with transcatheter aortic valve replacement, a discussion has been held regarding what types of management strategies would be attempted intraoperatively in the event of life-threatening complications, including whether or not the patient would be considered a candidate for the use of cardiopulmonary bypass and/or conversion to open sternotomy for attempted surgical intervention.  The patient has been advised of a variety of complications that might develop including but not limited to risks of death,  stroke, paravalvular leak, aortic dissection or other major vascular complications, aortic annulus rupture, device embolization, cardiac rupture or perforation, mitral regurgitation, acute myocardial infarction, arrhythmia, heart block or bradycardia requiring permanent pacemaker placement, congestive heart failure, respiratory failure, renal failure, pneumonia, infection, other late complications related to structural valve deterioration or migration, or other complications that might ultimately cause a temporary or permanent loss of functional independence or other long term morbidity.  The patient provides full informed consent for the procedure as described and all questions were answered.    I spent in excess of 90 minutes during the conduct of this office consultation and >50% of this time involved direct face-to-face encounter with the patient for counseling and/or coordination of their care.   Valentina Gu. Roxy Manns, MD 03/03/2017 10:29 AM

## 2017-03-04 ENCOUNTER — Other Ambulatory Visit: Payer: Self-pay

## 2017-03-04 ENCOUNTER — Other Ambulatory Visit: Payer: Self-pay | Admitting: *Deleted

## 2017-03-04 ENCOUNTER — Encounter (HOSPITAL_COMMUNITY)
Admission: RE | Disposition: A | Discharge status: Home / Self Care | Payer: Self-pay | Source: Ambulatory Visit | Attending: Cardiovascular Disease

## 2017-03-04 ENCOUNTER — Inpatient Hospital Stay (HOSPITAL_COMMUNITY): Payer: Medicare Other | Admitting: Anesthesiology

## 2017-03-04 ENCOUNTER — Inpatient Hospital Stay (HOSPITAL_COMMUNITY)
Admission: RE | Admit: 2017-03-04 | Discharge: 2017-03-07 | DRG: 267 | Disposition: A | Discharge status: Home / Self Care | Payer: Medicare Other | Source: Ambulatory Visit | Attending: Cardiovascular Disease | Admitting: Cardiovascular Disease

## 2017-03-04 ENCOUNTER — Encounter (HOSPITAL_COMMUNITY): Payer: Self-pay | Admitting: Orthopedic Surgery

## 2017-03-04 ENCOUNTER — Inpatient Hospital Stay (HOSPITAL_COMMUNITY): Payer: Medicare Other

## 2017-03-04 DIAGNOSIS — I340 Nonrheumatic mitral (valve) insufficiency: Secondary | ICD-10-CM | POA: Diagnosis not present

## 2017-03-04 DIAGNOSIS — I350 Nonrheumatic aortic (valve) stenosis: Secondary | ICD-10-CM

## 2017-03-04 DIAGNOSIS — Z9842 Cataract extraction status, left eye: Secondary | ICD-10-CM

## 2017-03-04 DIAGNOSIS — Z8546 Personal history of malignant neoplasm of prostate: Secondary | ICD-10-CM | POA: Diagnosis not present

## 2017-03-04 DIAGNOSIS — I25110 Atherosclerotic heart disease of native coronary artery with unstable angina pectoris: Secondary | ICD-10-CM | POA: Diagnosis not present

## 2017-03-04 DIAGNOSIS — Z952 Presence of prosthetic heart valve: Secondary | ICD-10-CM | POA: Diagnosis not present

## 2017-03-04 DIAGNOSIS — E039 Hypothyroidism, unspecified: Secondary | ICD-10-CM | POA: Diagnosis present

## 2017-03-04 DIAGNOSIS — I2510 Atherosclerotic heart disease of native coronary artery without angina pectoris: Secondary | ICD-10-CM | POA: Diagnosis not present

## 2017-03-04 DIAGNOSIS — Z8249 Family history of ischemic heart disease and other diseases of the circulatory system: Secondary | ICD-10-CM | POA: Diagnosis not present

## 2017-03-04 DIAGNOSIS — Z86010 Personal history of colonic polyps: Secondary | ICD-10-CM

## 2017-03-04 DIAGNOSIS — K219 Gastro-esophageal reflux disease without esophagitis: Secondary | ICD-10-CM | POA: Diagnosis not present

## 2017-03-04 DIAGNOSIS — Z91010 Allergy to peanuts: Secondary | ICD-10-CM

## 2017-03-04 DIAGNOSIS — Z87891 Personal history of nicotine dependence: Secondary | ICD-10-CM | POA: Diagnosis not present

## 2017-03-04 DIAGNOSIS — Z7951 Long term (current) use of inhaled steroids: Secondary | ICD-10-CM

## 2017-03-04 DIAGNOSIS — Z888 Allergy status to other drugs, medicaments and biological substances status: Secondary | ICD-10-CM

## 2017-03-04 DIAGNOSIS — I10 Essential (primary) hypertension: Secondary | ICD-10-CM | POA: Diagnosis present

## 2017-03-04 DIAGNOSIS — I4891 Unspecified atrial fibrillation: Secondary | ICD-10-CM | POA: Diagnosis not present

## 2017-03-04 DIAGNOSIS — Z954 Presence of other heart-valve replacement: Secondary | ICD-10-CM | POA: Diagnosis not present

## 2017-03-04 DIAGNOSIS — Z9841 Cataract extraction status, right eye: Secondary | ICD-10-CM

## 2017-03-04 DIAGNOSIS — I360 Nonrheumatic tricuspid (valve) stenosis: Secondary | ICD-10-CM | POA: Diagnosis not present

## 2017-03-04 DIAGNOSIS — Z951 Presence of aortocoronary bypass graft: Secondary | ICD-10-CM | POA: Diagnosis not present

## 2017-03-04 DIAGNOSIS — Z96653 Presence of artificial knee joint, bilateral: Secondary | ICD-10-CM | POA: Diagnosis present

## 2017-03-04 DIAGNOSIS — Z006 Encounter for examination for normal comparison and control in clinical research program: Secondary | ICD-10-CM | POA: Diagnosis not present

## 2017-03-04 DIAGNOSIS — Z7982 Long term (current) use of aspirin: Secondary | ICD-10-CM

## 2017-03-04 DIAGNOSIS — I25810 Atherosclerosis of coronary artery bypass graft(s) without angina pectoris: Secondary | ICD-10-CM | POA: Diagnosis not present

## 2017-03-04 DIAGNOSIS — I5042 Chronic combined systolic (congestive) and diastolic (congestive) heart failure: Secondary | ICD-10-CM | POA: Diagnosis not present

## 2017-03-04 DIAGNOSIS — I5020 Unspecified systolic (congestive) heart failure: Secondary | ICD-10-CM | POA: Diagnosis present

## 2017-03-04 DIAGNOSIS — E785 Hyperlipidemia, unspecified: Secondary | ICD-10-CM | POA: Diagnosis not present

## 2017-03-04 DIAGNOSIS — Z79899 Other long term (current) drug therapy: Secondary | ICD-10-CM | POA: Diagnosis not present

## 2017-03-04 DIAGNOSIS — I252 Old myocardial infarction: Secondary | ICD-10-CM

## 2017-03-04 DIAGNOSIS — Z6841 Body Mass Index (BMI) 40.0 and over, adult: Secondary | ICD-10-CM

## 2017-03-04 DIAGNOSIS — Z953 Presence of xenogenic heart valve: Secondary | ICD-10-CM

## 2017-03-04 DIAGNOSIS — Z961 Presence of intraocular lens: Secondary | ICD-10-CM | POA: Diagnosis present

## 2017-03-04 DIAGNOSIS — I5022 Chronic systolic (congestive) heart failure: Secondary | ICD-10-CM | POA: Diagnosis present

## 2017-03-04 DIAGNOSIS — I110 Hypertensive heart disease with heart failure: Secondary | ICD-10-CM | POA: Diagnosis not present

## 2017-03-04 DIAGNOSIS — I081 Rheumatic disorders of both mitral and tricuspid valves: Secondary | ICD-10-CM | POA: Diagnosis not present

## 2017-03-04 DIAGNOSIS — Z882 Allergy status to sulfonamides status: Secondary | ICD-10-CM

## 2017-03-04 HISTORY — PX: TRANSCATHETER AORTIC VALVE REPLACEMENT, TRANSFEMORAL: SHX6400

## 2017-03-04 HISTORY — DX: Presence of prosthetic heart valve: Z95.2

## 2017-03-04 HISTORY — PX: TEE WITHOUT CARDIOVERSION: SHX5443

## 2017-03-04 LAB — ECHOCARDIOGRAM LIMITED
AO mean calculated velocity dopler: 150 cm/s
AOPV: 0.47 m/s
AV Area mean vel: 1.57 cm2
AV Mean grad: 12 mmHg
AV VEL mean LVOT/AV: 0.38
AV pk vel: 202 cm/s
AVAREAMEANVIN: 0.64 cm2/m2
AVAREAVTI: 1.97 cm2
AVAREAVTIIND: 0.66 cm2/m2
AVPG: 16 mmHg
CHL CUP AV PEAK INDEX: 0.81
CHL CUP AV VEL: 1.6
CHL CUP DOP CALC LVOT VTI: 20.5 cm
FS: 23 % — AB (ref 28–44)
IV/PV OW: 1.06
LA diam end sys: 50 mm
LADIAMINDEX: 2.05 cm/m2
LASIZE: 50 mm
LVOT area: 4.15 cm2
LVOT diameter: 23 mm
LVOT peak vel: 95.8 cm/s
LVOTSV: 85 mL
LVOTVTI: 0.39 cm
PW: 17 mm — AB (ref 0.6–1.1)
VTI: 53.2 cm
Valve area index: 0.66
Valve area: 1.6 cm2

## 2017-03-04 LAB — POCT I-STAT 4, (NA,K, GLUC, HGB,HCT)
GLUCOSE: 119 mg/dL — AB (ref 65–99)
HCT: 38 % — ABNORMAL LOW (ref 39.0–52.0)
Hemoglobin: 12.9 g/dL — ABNORMAL LOW (ref 13.0–17.0)
POTASSIUM: 3.9 mmol/L (ref 3.5–5.1)
SODIUM: 141 mmol/L (ref 135–145)

## 2017-03-04 LAB — POCT I-STAT, CHEM 8
BUN: 13 mg/dL (ref 6–20)
BUN: 11 mg/dL (ref 6–20)
CHLORIDE: 101 mmol/L (ref 101–111)
CREATININE: 0.7 mg/dL (ref 0.61–1.24)
CREATININE: 0.5 mg/dL — AB (ref 0.61–1.24)
Calcium, Ion: 1.07 mmol/L — ABNORMAL LOW (ref 1.15–1.40)
Calcium, Ion: 1.1 mmol/L — ABNORMAL LOW (ref 1.15–1.40)
Chloride: 100 mmol/L — ABNORMAL LOW (ref 101–111)
GLUCOSE: 138 mg/dL — AB (ref 65–99)
Glucose, Bld: 136 mg/dL — ABNORMAL HIGH (ref 65–99)
HEMATOCRIT: 43 % (ref 39.0–52.0)
HEMATOCRIT: 40 % (ref 39.0–52.0)
HEMOGLOBIN: 14.6 g/dL (ref 13.0–17.0)
HEMOGLOBIN: 13.6 g/dL (ref 13.0–17.0)
POTASSIUM: 3.9 mmol/L (ref 3.5–5.1)
POTASSIUM: 3.8 mmol/L (ref 3.5–5.1)
SODIUM: 140 mmol/L (ref 135–145)
Sodium: 140 mmol/L (ref 135–145)
TCO2: 28 mmol/L (ref 0–100)
TCO2: 28 mmol/L (ref 0–100)

## 2017-03-04 LAB — CBC
HCT: 39.6 % (ref 39.0–52.0)
Hemoglobin: 12.8 g/dL — ABNORMAL LOW (ref 13.0–17.0)
MCH: 27.4 pg (ref 26.0–34.0)
MCHC: 32.3 g/dL (ref 30.0–36.0)
MCV: 84.6 fL (ref 78.0–100.0)
PLATELETS: 184 10*3/uL (ref 150–400)
RBC: 4.68 MIL/uL (ref 4.22–5.81)
RDW: 16 % — AB (ref 11.5–15.5)
WBC: 13.9 10*3/uL — AB (ref 4.0–10.5)

## 2017-03-04 LAB — PROTIME-INR
INR: 1.23
PROTHROMBIN TIME: 15.6 s — AB (ref 11.4–15.2)

## 2017-03-04 LAB — APTT: APTT: 36 s (ref 24–36)

## 2017-03-04 SURGERY — IMPLANTATION, AORTIC VALVE, TRANSCATHETER, FEMORAL APPROACH
Anesthesia: Monitor Anesthesia Care

## 2017-03-04 MED ORDER — LIDOCAINE HCL 1 % IJ SOLN
INTRAMUSCULAR | Status: DC | PRN
  Administered 2017-03-04: 25 mL

## 2017-03-04 MED ORDER — ASPIRIN 81 MG PO CHEW
81.0000 mg | CHEWABLE_TABLET | Freq: Every day | ORAL | Status: DC
  Administered 2017-03-04 – 2017-03-07 (×4): 81 mg via ORAL
  Filled 2017-03-04 (×4): qty 1

## 2017-03-04 MED ORDER — ALBUMIN HUMAN 5 % IV SOLN: 250.0000 mL | INTRAVENOUS | Status: DC | PRN

## 2017-03-04 MED ORDER — ACETAMINOPHEN 500 MG PO TABS
1000.0000 mg | ORAL_TABLET | Freq: Four times a day (QID) | ORAL | Status: DC
  Administered 2017-03-04 – 2017-03-05 (×3): 1000 mg via ORAL
  Filled 2017-03-04 (×3): qty 2

## 2017-03-04 MED ORDER — LIDOCAINE HCL (CARDIAC) 20 MG/ML IV SOLN
INTRAVENOUS | Status: AC
  Filled 2017-03-04: qty 5

## 2017-03-04 MED ORDER — ASPIRIN 81 MG PO TABS: 81.0000 mg | ORAL_TABLET | Freq: Every evening | ORAL | Status: DC

## 2017-03-04 MED ORDER — DEXMEDETOMIDINE HCL 200 MCG/2ML IV SOLN
INTRAVENOUS | Status: DC | PRN
  Administered 2017-03-04: 119.8 ug via INTRAVENOUS

## 2017-03-04 MED ORDER — LACTATED RINGERS IV SOLN
INTRAVENOUS | Status: DC | PRN
  Administered 2017-03-04: 07:00:00 via INTRAVENOUS

## 2017-03-04 MED ORDER — ACETAMINOPHEN 650 MG RE SUPP
650.0000 mg | Freq: Once | RECTAL | Status: DC
  Filled 2017-03-04: qty 1

## 2017-03-04 MED ORDER — CHLORHEXIDINE GLUCONATE 4 % EX LIQD: 30.0000 mL | CUTANEOUS | Status: DC

## 2017-03-04 MED ORDER — ORAL CARE MOUTH RINSE
15.0000 mL | Freq: Two times a day (BID) | OROMUCOSAL | Status: DC
  Administered 2017-03-04 – 2017-03-05 (×2): 15 mL via OROMUCOSAL

## 2017-03-04 MED ORDER — CHLORHEXIDINE GLUCONATE 4 % EX LIQD: 60.0000 mL | Freq: Once | CUTANEOUS | Status: DC

## 2017-03-04 MED ORDER — POTASSIUM CHLORIDE CRYS ER 20 MEQ PO TBCR
40.0000 meq | EXTENDED_RELEASE_TABLET | Freq: Every day | ORAL | Status: DC
  Administered 2017-03-05 – 2017-03-07 (×3): 40 meq via ORAL
  Filled 2017-03-04 (×4): qty 2

## 2017-03-04 MED ORDER — LOSARTAN POTASSIUM 25 MG PO TABS
25.0000 mg | ORAL_TABLET | Freq: Every day | ORAL | Status: DC
  Administered 2017-03-04 – 2017-03-07 (×4): 25 mg via ORAL
  Filled 2017-03-04 (×4): qty 1

## 2017-03-04 MED ORDER — METOPROLOL TARTRATE 5 MG/5ML IV SOLN: 2.5000 mg | INTRAVENOUS | Status: DC | PRN

## 2017-03-04 MED ORDER — MOMETASONE FURO-FORMOTEROL FUM 200-5 MCG/ACT IN AERO
2.0000 | INHALATION_SPRAY | Freq: Two times a day (BID) | RESPIRATORY_TRACT | Status: DC
  Administered 2017-03-04 – 2017-03-07 (×5): 2 via RESPIRATORY_TRACT
  Filled 2017-03-04: qty 8.8

## 2017-03-04 MED ORDER — PHENYLEPHRINE HCL 10 MG/ML IJ SOLN
INTRAMUSCULAR | Status: DC | PRN
  Administered 2017-03-04: 120 ug via INTRAVENOUS

## 2017-03-04 MED ORDER — ACETAMINOPHEN 160 MG/5ML PO SOLN: 650.0000 mg | Freq: Once | ORAL | Status: DC

## 2017-03-04 MED ORDER — CLOPIDOGREL BISULFATE 75 MG PO TABS
75.0000 mg | ORAL_TABLET | Freq: Every day | ORAL | Status: DC
  Administered 2017-03-05: 75 mg via ORAL
  Filled 2017-03-04: qty 1

## 2017-03-04 MED ORDER — PROTAMINE SULFATE 10 MG/ML IV SOLN
INTRAVENOUS | Status: DC | PRN
  Administered 2017-03-04: 180 mg via INTRAVENOUS

## 2017-03-04 MED ORDER — PROPOFOL 500 MG/50ML IV EMUL
INTRAVENOUS | Status: DC | PRN
  Administered 2017-03-04: 20 ug/kg/min via INTRAVENOUS

## 2017-03-04 MED ORDER — PROPOFOL 10 MG/ML IV BOLUS
INTRAVENOUS | Status: DC | PRN
  Administered 2017-03-04: 30 mg via INTRAVENOUS

## 2017-03-04 MED ORDER — PROPOFOL 10 MG/ML IV BOLUS
INTRAVENOUS | Status: AC
  Filled 2017-03-04: qty 20

## 2017-03-04 MED ORDER — TRAMADOL HCL 50 MG PO TABS: 50.0000 mg | ORAL_TABLET | ORAL | Status: DC | PRN

## 2017-03-04 MED ORDER — 0.9 % SODIUM CHLORIDE (POUR BTL) OPTIME
TOPICAL | Status: DC | PRN
  Administered 2017-03-04: 1000 mL

## 2017-03-04 MED ORDER — AZELASTINE-FLUTICASONE 137-50 MCG/ACT NA SUSP: 2.0000 | Freq: Two times a day (BID) | NASAL | Status: DC

## 2017-03-04 MED ORDER — SODIUM CHLORIDE 0.9 % IV SOLN
INTRAVENOUS | Status: AC
  Administered 2017-03-04: 11:00:00 via INTRAVENOUS

## 2017-03-04 MED ORDER — VANCOMYCIN HCL IN DEXTROSE 1-5 GM/200ML-% IV SOLN
1000.0000 mg | Freq: Once | INTRAVENOUS | Status: AC
  Administered 2017-03-04: 1000 mg via INTRAVENOUS
  Filled 2017-03-04: qty 200

## 2017-03-04 MED ORDER — LACTATED RINGERS IV SOLN: 500.0000 mL | Freq: Once | INTRAVENOUS | Status: DC | PRN

## 2017-03-04 MED ORDER — LORATADINE 10 MG PO TABS
10.0000 mg | ORAL_TABLET | Freq: Every day | ORAL | Status: DC
  Administered 2017-03-04 – 2017-03-07 (×4): 10 mg via ORAL
  Filled 2017-03-04 (×4): qty 1

## 2017-03-04 MED ORDER — METOPROLOL TARTRATE 50 MG PO TABS: 50.0000 mg | ORAL_TABLET | Freq: Two times a day (BID) | ORAL | Status: DC

## 2017-03-04 MED ORDER — OXYCODONE HCL 5 MG PO TABS: 5.0000 mg | ORAL_TABLET | ORAL | Status: DC | PRN

## 2017-03-04 MED ORDER — IODIXANOL 320 MG/ML IV SOLN
INTRAVENOUS | Status: DC | PRN
  Administered 2017-03-04: 62.8 mL via INTRAVENOUS

## 2017-03-04 MED ORDER — ACETAMINOPHEN 160 MG/5ML PO SOLN: 1000.0000 mg | Freq: Four times a day (QID) | ORAL | Status: DC

## 2017-03-04 MED ORDER — SUFENTANIL CITRATE 50 MCG/ML IV SOLN
INTRAVENOUS | Status: AC
  Filled 2017-03-04: qty 1

## 2017-03-04 MED ORDER — ONDANSETRON HCL 4 MG/2ML IJ SOLN: 4.0000 mg | Freq: Four times a day (QID) | INTRAMUSCULAR | Status: DC | PRN

## 2017-03-04 MED ORDER — MORPHINE SULFATE (PF) 2 MG/ML IV SOLN: 2.0000 mg | INTRAVENOUS | Status: DC | PRN

## 2017-03-04 MED ORDER — SODIUM CHLORIDE 0.9 % IJ SOLN
INTRAMUSCULAR | Status: AC
  Filled 2017-03-04: qty 10

## 2017-03-04 MED ORDER — ONDANSETRON HCL 4 MG/2ML IJ SOLN
INTRAMUSCULAR | Status: DC | PRN
  Administered 2017-03-04: 4 mg via INTRAVENOUS

## 2017-03-04 MED ORDER — ATORVASTATIN CALCIUM 20 MG PO TABS
20.0000 mg | ORAL_TABLET | Freq: Every day | ORAL | Status: DC
  Administered 2017-03-04 – 2017-03-06 (×3): 20 mg via ORAL
  Filled 2017-03-04 (×3): qty 1

## 2017-03-04 MED ORDER — SUFENTANIL CITRATE 50 MCG/ML IV SOLN
INTRAVENOUS | Status: DC | PRN
  Administered 2017-03-04: 5 ug via INTRAVENOUS

## 2017-03-04 MED ORDER — LEVOTHYROXINE SODIUM 75 MCG PO TABS
75.0000 ug | ORAL_TABLET | Freq: Every day | ORAL | Status: DC
  Administered 2017-03-05 – 2017-03-07 (×3): 75 ug via ORAL
  Filled 2017-03-04 (×3): qty 1

## 2017-03-04 MED ORDER — DEXTROSE 5 % IV SOLN
1.5000 g | Freq: Two times a day (BID) | INTRAVENOUS | Status: AC
  Administered 2017-03-04 – 2017-03-06 (×4): 1.5 g via INTRAVENOUS
  Filled 2017-03-04 (×4): qty 1.5

## 2017-03-04 MED ORDER — MIDAZOLAM HCL 2 MG/2ML IJ SOLN
INTRAMUSCULAR | Status: AC
  Filled 2017-03-04: qty 2

## 2017-03-04 MED ORDER — ONDANSETRON HCL 4 MG/2ML IJ SOLN
INTRAMUSCULAR | Status: AC
  Filled 2017-03-04: qty 2

## 2017-03-04 MED ORDER — HEPARIN SODIUM (PORCINE) 1000 UNIT/ML IJ SOLN
INTRAMUSCULAR | Status: DC | PRN
  Administered 2017-03-04: 18000 [IU] via INTRAVENOUS

## 2017-03-04 MED ORDER — MORPHINE SULFATE (PF) 4 MG/ML IV SOLN
2.0000 mg | INTRAVENOUS | Status: DC | PRN
  Filled 2017-03-04: qty 1

## 2017-03-04 MED ORDER — MIDAZOLAM HCL 2 MG/2ML IJ SOLN: 2.0000 mg | INTRAMUSCULAR | Status: DC | PRN

## 2017-03-04 MED ORDER — FUROSEMIDE 80 MG PO TABS
80.0000 mg | ORAL_TABLET | Freq: Every day | ORAL | Status: DC
  Administered 2017-03-05: 80 mg via ORAL
  Filled 2017-03-04 (×2): qty 1

## 2017-03-04 MED ORDER — ISOSORBIDE MONONITRATE ER 30 MG PO TB24
30.0000 mg | ORAL_TABLET | Freq: Every day | ORAL | Status: DC
  Administered 2017-03-05 – 2017-03-07 (×3): 30 mg via ORAL
  Filled 2017-03-04 (×3): qty 1

## 2017-03-04 MED ORDER — FAMOTIDINE IN NACL 20-0.9 MG/50ML-% IV SOLN: 20.0000 mg | Freq: Two times a day (BID) | INTRAVENOUS | Status: AC

## 2017-03-04 MED ORDER — LIDOCAINE HCL (PF) 1 % IJ SOLN
INTRAMUSCULAR | Status: AC
  Filled 2017-03-04: qty 30

## 2017-03-04 MED ORDER — ROCURONIUM BROMIDE 50 MG/5ML IV SOLN
INTRAVENOUS | Status: AC
  Filled 2017-03-04: qty 1

## 2017-03-04 MED ORDER — MONTELUKAST SODIUM 10 MG PO TABS
10.0000 mg | ORAL_TABLET | Freq: Every evening | ORAL | Status: DC
  Administered 2017-03-04 – 2017-03-06 (×3): 10 mg via ORAL
  Filled 2017-03-04 (×3): qty 1

## 2017-03-04 MED ORDER — MIDAZOLAM HCL 5 MG/5ML IJ SOLN
INTRAMUSCULAR | Status: DC | PRN
  Administered 2017-03-04: 1 mg via INTRAVENOUS

## 2017-03-04 MED ORDER — SODIUM CHLORIDE 0.9 % IV SOLN
INTRAVENOUS | Status: DC | PRN
  Administered 2017-03-04: 08:00:00 1500 mL

## 2017-03-04 MED FILL — Heparin Sodium (Porcine) Inj 1000 Unit/ML: INTRAMUSCULAR | Qty: 30 | Status: AC

## 2017-03-04 MED FILL — Magnesium Sulfate Inj 50%: INTRAMUSCULAR | Qty: 10 | Status: AC

## 2017-03-04 MED FILL — Potassium Chloride Inj 2 mEq/ML: INTRAVENOUS | Qty: 10 | Status: AC

## 2017-03-04 SURGICAL SUPPLY — 94 items
ADAPTER UNIV SWAN GANZ BIP (ADAPTER) ×1 IMPLANT
ADAPTER UNV SWAN GANZ BIP (ADAPTER) ×2
BAG BANDED W/RUBBER/TAPE 36X54 (MISCELLANEOUS) ×3 IMPLANT
BAG DECANTER FOR FLEXI CONT (MISCELLANEOUS) IMPLANT
BAG SNAP BAND KOVER 36X36 (MISCELLANEOUS) ×6 IMPLANT
BLADE CLIPPER SURG (BLADE) IMPLANT
BLADE OSCILLATING /SAGITTAL (BLADE) IMPLANT
BLADE STERNUM SYSTEM 6 (BLADE) ×3 IMPLANT
CABLE ADAPT CONN TEMP 6FT (ADAPTER) ×3 IMPLANT
CABLE PACING FASLOC BIEGE (MISCELLANEOUS) ×3 IMPLANT
CABLE PACING FASLOC BLUE (MISCELLANEOUS) ×3 IMPLANT
CANNULA FEM VENOUS REMOTE 22FR (CANNULA) IMPLANT
CANNULA OPTISITE PERFUSION 16F (CANNULA) IMPLANT
CANNULA OPTISITE PERFUSION 18F (CANNULA) IMPLANT
CATH DIAG EXPO 6F VENT PIG 145 (CATHETERS) ×6 IMPLANT
CATH EXPO 5FR AL1 (CATHETERS) ×3 IMPLANT
CATH S G BIP PACING (SET/KITS/TRAYS/PACK) ×6 IMPLANT
CONT SPEC 4OZ CLIKSEAL STRL BL (MISCELLANEOUS) ×18 IMPLANT
COVER BACK TABLE 24X17X13 BIG (DRAPES) ×3 IMPLANT
COVER BACK TABLE 60X90IN (DRAPES) ×3 IMPLANT
COVER BACK TABLE 80X110 HD (DRAPES) ×3 IMPLANT
COVER DOME SNAP 22 D (MISCELLANEOUS) ×3 IMPLANT
COVER MAYO STAND STRL (DRAPES) ×3 IMPLANT
CRADLE DONUT ADULT HEAD (MISCELLANEOUS) ×3 IMPLANT
DERMABOND ADVANCED (GAUZE/BANDAGES/DRESSINGS) ×2
DERMABOND ADVANCED .7 DNX12 (GAUZE/BANDAGES/DRESSINGS) ×1 IMPLANT
DEVICE CLOSURE PERCLS PRGLD 6F (VASCULAR PRODUCTS) ×3 IMPLANT
DRAPE INCISE IOBAN 66X45 STRL (DRAPES) IMPLANT
DRAPE SLUSH MACHINE 52X66 (DRAPES) ×3 IMPLANT
DRSG ADAPTIC 3X8 NADH LF (GAUZE/BANDAGES/DRESSINGS) ×6 IMPLANT
DRSG TEGADERM 4X4.75 (GAUZE/BANDAGES/DRESSINGS) ×3 IMPLANT
ELECT REM PT RETURN 9FT ADLT (ELECTROSURGICAL) ×6
ELECTRODE REM PT RTRN 9FT ADLT (ELECTROSURGICAL) ×2 IMPLANT
FELT TEFLON 6X6 (MISCELLANEOUS) ×3 IMPLANT
FEMORAL VENOUS CANN RAP (CANNULA) IMPLANT
GAUZE SPONGE 4X4 12PLY STRL (GAUZE/BANDAGES/DRESSINGS) ×3 IMPLANT
GAUZE SPONGE 4X4 12PLY STRL LF (GAUZE/BANDAGES/DRESSINGS) ×6 IMPLANT
GLOVE BIO SURGEON STRL SZ8 (GLOVE) ×6 IMPLANT
GLOVE EUDERMIC 7 POWDERFREE (GLOVE) ×6 IMPLANT
GLOVE ORTHO TXT STRL SZ7.5 (GLOVE) ×6 IMPLANT
GOWN STRL REUS W/ TWL LRG LVL3 (GOWN DISPOSABLE) ×3 IMPLANT
GOWN STRL REUS W/ TWL XL LVL3 (GOWN DISPOSABLE) ×6 IMPLANT
GOWN STRL REUS W/TWL LRG LVL3 (GOWN DISPOSABLE) ×6
GOWN STRL REUS W/TWL XL LVL3 (GOWN DISPOSABLE) ×12
GUIDEWIRE SAF TJ AMPL .035X180 (WIRE) ×3 IMPLANT
GUIDEWIRE SAFE TJ AMPLATZ EXST (WIRE) ×3 IMPLANT
GUIDEWIRE STRAIGHT .035 260CM (WIRE) ×3 IMPLANT
INSERT FOGARTY 61MM (MISCELLANEOUS) ×3 IMPLANT
INSERT FOGARTY SM (MISCELLANEOUS) ×6 IMPLANT
INSERT FOGARTY XLG (MISCELLANEOUS) IMPLANT
KIT BASIN OR (CUSTOM PROCEDURE TRAY) ×3 IMPLANT
KIT DILATOR VASC 18G NDL (KITS) IMPLANT
KIT HEART LEFT (KITS) ×3 IMPLANT
KIT ROOM TURNOVER OR (KITS) ×3 IMPLANT
KIT SUCTION CATH 14FR (SUCTIONS) ×6 IMPLANT
NEEDLE PERC 18GX7CM (NEEDLE) ×3 IMPLANT
NS IRRIG 1000ML POUR BTL (IV SOLUTION) ×9 IMPLANT
PACK AORTA (CUSTOM PROCEDURE TRAY) ×3 IMPLANT
PAD ARMBOARD 7.5X6 YLW CONV (MISCELLANEOUS) ×6 IMPLANT
PAD ELECT DEFIB RADIOL ZOLL (MISCELLANEOUS) ×3 IMPLANT
PERCLOSE PROGLIDE 6F (VASCULAR PRODUCTS) ×9
SET MICROPUNCTURE 5F STIFF (MISCELLANEOUS) ×3 IMPLANT
SHEATH AVANTI 11CM 8FR (MISCELLANEOUS) ×3 IMPLANT
SHEATH PINNACLE 6F 10CM (SHEATH) ×6 IMPLANT
SLEEVE REPOSITIONING LENGTH 30 (MISCELLANEOUS) ×3 IMPLANT
SPONGE LAP 4X18 X RAY DECT (DISPOSABLE) ×3 IMPLANT
STOPCOCK MORSE 400PSI 3WAY (MISCELLANEOUS) ×18 IMPLANT
SUT ETHIBOND X763 2 0 SH 1 (SUTURE) ×3 IMPLANT
SUT GORETEX CV 4 TH 22 36 (SUTURE) ×3 IMPLANT
SUT GORETEX CV4 TH-18 (SUTURE) ×9 IMPLANT
SUT GORETEX TH-18 36 INCH (SUTURE) ×6 IMPLANT
SUT MNCRL AB 3-0 PS2 18 (SUTURE) ×3 IMPLANT
SUT PROLENE 3 0 SH1 36 (SUTURE) IMPLANT
SUT PROLENE 4 0 RB 1 (SUTURE) ×2
SUT PROLENE 4-0 RB1 .5 CRCL 36 (SUTURE) ×1 IMPLANT
SUT PROLENE 5 0 C 1 36 (SUTURE) ×6 IMPLANT
SUT PROLENE 6 0 C 1 30 (SUTURE) ×6 IMPLANT
SUT SILK  1 MH (SUTURE) ×2
SUT SILK 1 MH (SUTURE) ×1 IMPLANT
SUT SILK 2 0 SH CR/8 (SUTURE) IMPLANT
SUT VIC AB 2-0 CT1 27 (SUTURE) ×2
SUT VIC AB 2-0 CT1 TAPERPNT 27 (SUTURE) ×1 IMPLANT
SUT VIC AB 2-0 CTX 36 (SUTURE) IMPLANT
SUT VIC AB 3-0 SH 8-18 (SUTURE) ×6 IMPLANT
SYR 10ML LL (SYRINGE) ×9 IMPLANT
SYR 30ML LL (SYRINGE) ×6 IMPLANT
SYR 50ML LL SCALE MARK (SYRINGE) ×3 IMPLANT
TOWEL OR 17X26 10 PK STRL BLUE (TOWEL DISPOSABLE) ×6 IMPLANT
TRANSDUCER W/STOPCOCK (MISCELLANEOUS) ×6 IMPLANT
TRAY FOLEY SILVER 16FR TEMP (SET/KITS/TRAYS/PACK) ×3 IMPLANT
TUBING HIGH PRESSURE 120CM (CONNECTOR) ×3 IMPLANT
VALVE HEART TRANSCATH SZ3 29MM (Prosthesis & Implant Heart) ×3 IMPLANT
WIRE AMPLATZ SS-J .035X180CM (WIRE) ×3 IMPLANT
WIRE BENTSON .035X145CM (WIRE) ×3 IMPLANT

## 2017-03-04 NOTE — CV Procedure (Signed)
[facility]  TAVR OPERATIVE NOTE   Date of Procedure:  03/04/2017  Preoperative Diagnosis: Severe Aortic Stenosis   Postoperative Diagnosis: Same   Procedure:    Transcatheter Aortic Valve Replacement - Transfemoral Approach  Edwards Sapien 3 THV (size 29 mm, model # [id number], serial # [id number])   Co-Surgeons:  Valentina Gu. Roxy Manns, MD and [name], MD  Anesthesiologist:  Linna Caprice  Echocardiographer:  Johnsie Cancel  Pre-operative Echo Findings:  Severe aortic stenosis  Mild left ventricular systolic dysfunction  Post-operative Echo Findings:  No paravalvular leak  Mild left ventricular systolic dysfunction    BRIEF CLINICAL NOTE AND INDICATIONS FOR SURGERY  83 yo male with history of CAD s/p CABG, HTN, HLD, chronic diastolic CHF and severe AS with progressive dyspnea on exertion and worsened CHF. Echo with LVEF=40-45%. Cardiac cath with 4 patent bypass grafts. The aortic valve is thickened and does not open well c/w severe AS.   During the course of the patient's preoperative work up they have been evaluated comprehensively by a multidisciplinary team of specialists coordinated through the [facility] in the [facility].  They have been demonstrated to suffer from symptomatic severe aortic stenosis as noted above. The patient has been counseled extensively as to the relative risks and benefits of all options for the treatment of severe aortic stenosis including long term medical therapy, conventional surgery for aortic valve replacement, and transcatheter aortic valve replacement.  The patient has been independently evaluated by two cardiac surgeons including Dr Roxy Manns and Dr. Servando Snare, and they are felt to be at high risk for conventional surgical aortic valve replacement. Both surgeons indicated the patient would be a poor candidate for conventional surgery because of comorbidities including prior open chest  procedure, morbid obesity, advanced age.   Based upon review of all of the patient's preoperative diagnostic tests they are felt to be candidate for transcatheter aortic valve replacement using the transfemoral approach as an alternative to high risk conventional surgery.    Following the decision to proceed with transcatheter aortic valve replacement, a discussion has been held regarding what types of management strategies would be attempted intraoperatively in the event of life-threatening complications, including whether or not the patient would be considered a candidate for the use of cardiopulmonary bypass and/or conversion to open sternotomy for attempted surgical intervention.  The patient has been advised of a variety of complications that might develop peculiar to this approach including but not limited to risks of death, stroke, paravalvular leak, aortic dissection or other major vascular complications, aortic annulus rupture, device embolization, cardiac rupture or perforation, acute myocardial infarction, arrhythmia, heart block or bradycardia requiring permanent pacemaker placement, congestive heart failure, respiratory failure, renal failure, pneumonia, infection, other late complications related to structural valve deterioration or migration, or other complications that might ultimately cause a temporary or permanent loss of functional independence or other long term morbidity.  The patient provides full informed consent for the procedure as described and all questions were answered preoperatively.    DETAILS OF THE OPERATIVE PROCEDURE  PREPARATION:   The patient is brought to the operating room on the above mentioned date and central monitoring was established by the anesthesia team including placement of Swan-Ganz catheter and radial arterial line. The patient is placed in the supine position on the operating table.  Intravenous antibiotics are administered. Conscious sedation is started. A  Foley catheter is placed.  Baseline transthoracic echocardiogram was performed. The patient's chest, abdomen, both  groins, and both lower extremities are prepared and draped in a sterile manner. A time out procedure is performed.   PERIPHERAL ACCESS:   Using the modified Seldinger technique, femoral arterial and venous access were obtained with placement of 6 Fr sheaths on the left side.  A pigtail diagnostic catheter was passed through the femoral arterial sheath under fluoroscopic guidance into the aortic root.  A temporary transvenous pacemaker catheter was passed through the femoral venous sheath under fluoroscopic guidance into the right ventricle.  The pacemaker was tested to ensure stable lead placement and pacemaker capture. Aortic root angiography was performed in order to determine the optimal angiographic angle for valve deployment.  TRANSFEMORAL ACCESS:  A micropuncture kid was used to access the right femoral artery. Position confirmed with angiography. Pre-closure with double ProGlide closure devices.   A 16 Fr transfemoral E-sheath was introduced into the right femoral artery after progressively dilating over an Amplatz superstiff wire. An AL-2 catheter was used to direct a straight-tip exchange length wire across the native aortic valve into the left ventricle. This was exchanged out for a pigtail catheter and position was confirmed in the LV apex. Simultaneous LV and Ao pressures were recorded.  The pigtail catheter was then exchanged for an Amplatz Extra-stiff wire in the LV apex.   TRANSCATHETER HEART VALVE DEPLOYMENT:  An Edwards Sapien 3 THV (size 29 mm) was prepared and crimped per manufacturer's guidelines, and the proper orientation of the valve is confirmed on the [employer] delivery system. The valve was advanced through the introducer sheath using normal technique until in an appropriate position in the abdominal aorta beyond the sheath tip. The balloon was then  retracted and using the fine-tuning wheel was centered on the valve. The valve was then advanced across the aortic arch using appropriate flexion of the catheter. The valve was carefully positioned across the aortic valve annulus. The Commander catheter was retracted using normal technique. Once final position of the valve has been confirmed by angiographic assessment, the valve is deployed while temporarily holding ventilation and during rapid ventricular pacing to maintain systolic blood pressure < 50 mmHg and pulse pressure < 10 mmHg. The balloon inflation is held for >3 seconds after reaching full deployment volume. Once the balloon has fully deflated the balloon is retracted into the ascending aorta and valve function is assessed using TEE. There is felt to be no paravalvular leak and no central aortic insufficiency.  The patient's hemodynamic recovery following valve deployment is good.  The deployment balloon and guidewire are both removed. Echo demostrated acceptable post-procedural gradients, stable mitral valve function, and no AI.   PROCEDURE COMPLETION:  The sheath was then removed and the closure device was completed. Protamine was administered once femoral arterial repair was complete. The temporary pacemaker, pigtail catheters and femoral sheaths were removed with manual pressure used for hemostasis.   The patient tolerated the procedure well and is transported to the surgical intensive care in stable condition. There were no immediate intraoperative complications. All sponge instrument and needle counts are verified correct at completion of the operation.   No blood products were administered during the operation.  The patient received a total of 62.8 mL of intravenous contrast during the procedure.  [name] MD 03/04/2017 7:45 AM

## 2017-03-04 NOTE — Interval H&P Note (Signed)
History and Physical Interval Note:  03/04/2017 7:22 AM  Justin [name]  has presented today for surgery with the diagnosis of SEVERE AS  The various methods of treatment have been discussed with the patient and family. After consideration of risks, benefits and other options for treatment, the patient has consented to  Procedure(s): TRANSCATHETER AORTIC VALVE REPLACEMENT, TRANSFEMORAL (N/A) TRANSESOPHAGEAL ECHOCARDIOGRAM (TEE) (N/A) as a surgical intervention .  The patient's history has been reviewed, patient examined, no change in status, stable for surgery.  I have reviewed the patient's chart and labs.  Questions were answered to the patient's satisfaction.     [name]

## 2017-03-04 NOTE — Op Note (Signed)
[facility]   MULTIDISCIPLINARY HEART VALVE TEAM   TAVR OPERATIVE NOTE   Date of Procedure:  03/04/2017  Preoperative Diagnosis: Severe Aortic Stenosis   Postoperative Diagnosis: Same   Procedure:    Transcatheter Aortic Valve Replacement - Percutaneous Right Transfemoral Approach  Edwards Sapien 3 THV (size 29 mm, model # [id number], serial # [id number])   Co-Surgeons:  [name], MD and Valentina Gu. Roxy Manns, MD  Anesthesiologist:  Roberts Gaudy, MD  Echocardiographer:  Jenkins Rouge, MD  Pre-operative Echo Findings:  Severe aortic stenosis  Moderate left ventricular systolic dysfunction  Post-operative Echo Findings:  No paravalvular leak  Unchanged left ventricular systolic function   BRIEF CLINICAL NOTE AND INDICATIONS FOR SURGERY  Patient is an 83 year old male with history of coronary artery disease status post coronary artery bypass grafting in 2007, aortic stenosis, previous myocardial infarction with chronic combined systolic and diastolic congestive heart failure, hypertension, hypothyroidism, and prostate cancer with been referred for second surgical opinion to discuss treatment options for management of severe low gradient low ejection fraction aortic stenosis.  The patient's cardiac history dates back approximately 18 years ago when he suffered an acute inferior wall myocardial infarction. He was treated medically and did well until 2007 when he presented with symptoms of angina pectoris. He was found to have severe left main and three-vessel coronary artery disease and underwent coronary artery bypass grafting 4 by Dr. Servando Snare. Grafts placed at the time of surgery included left internal mammary artery to the distal left anterior descending coronary artery, saphenous vein graft to diagonal branch, saphenous vein graft to the obtuse marginal branch of left circumflex coronary artery, and saphenous vein graft to the distal right coronary artery. At  the time of surgery the patient was noted to have sclerosis of the aortic valve with no significant aortic stenosis. The patient did well and has remained stable from a cardiac standpoint until fairly recently.  Over the last 2-3 years the patient has developed exertional shortness of breath and chronic lower extremity edema. Symptoms gradually progressed and have been more problematic over the last 6 months. Transthoracic echocardiograms have documented the presence of aortic stenosis that up until recently has been felt to be moderate in severity. In January of this year he was seen in follow-up by Dr. Angelena Form.  Diagnostic cardiac catheterization was performed revealing severe native coronary artery disease with chronic occlusion of left main coronary artery and the right coronary artery. All of the previously placed bypass grafts remained widely patent and free of significant disease.  Mean transvalvular gradient measured across the aortic valve was reported 17 mmHg. The patient was treated medically. Since then he has had further progression of symptoms of congestive heart failure, and in May he was hospitalized with acute exacerbation of chronic symptoms with resting shortness of breath and severe volume overload. He improved with diuretic therapy. Follow-up transthoracic echocardiogram performed at that time revealed the presence of aortic stenosis with peak velocity across the aortic valve measured a size 3.8 m/s corresponding to mean transvalvular gradient estimated 27 mmHg and aortic valve area calculated 0.57 cm. The DVI was reportedly quite low at 0.18 and left ventricular ejection fraction had notably dropped 40-45%, having previously been estimated 55-60% on the last previous echocardiogram performed in October 2017. The patient was referred for surgical consultation and evaluated previously by Dr. Servando Snare who felt that the patient would be at relatively high risk for conventional surgical aortic  valve replacement. Transcatheter aortic valve replacement was  discussed as an alternative and the patient was referred for second surgical opinion.  During the course of the patient's preoperative work up they have been evaluated comprehensively by a multidisciplinary team of specialists coordinated through the [facility] in the [facility].  They have been demonstrated to suffer from symptomatic severe aortic stenosis as noted above. The patient has been counseled extensively as to the relative risks and benefits of all options for the treatment of severe aortic stenosis including long term medical therapy, conventional surgery for aortic valve replacement, and transcatheter aortic valve replacement.  All questions have been answered, and the patient provides full informed consent for the operation as described.   DETAILS OF THE OPERATIVE PROCEDURE  PREPARATION:    The patient is brought to the operating room on the above mentioned date and central monitoring was established by the anesthesia team including placement of a central venous line and radial arterial line. The patient is placed in the supine position on the operating table.  Intravenous antibiotics are administered. The patient is monitored closely throughout the procedure under conscious sedation.  Baseline transthoracic echocardiogram was performed. The patient's chest, abdomen, both groins, and both lower extremities are prepared and draped in a sterile manner. A time out procedure is performed.   PERIPHERAL ACCESS:    Using the modified Seldinger technique, femoral arterial and venous access was obtained with placement of 6 Fr sheaths on the left side.  A pigtail diagnostic catheter was passed through the left arterial sheath under fluoroscopic guidance into the aortic root.  A temporary transvenous pacemaker catheter was passed through the left femoral venous sheath under  fluoroscopic guidance into the right ventricle.  The pacemaker was tested to ensure stable lead placement and pacemaker capture. Aortic root angiography was performed in order to determine the optimal angiographic angle for valve deployment.   TRANSFEMORAL ACCESS:   Percutaneous transfemoral access and sheath placement was performed by Dr Angelena Form. Please see his separate operative note for details. The patient was heparinized systemically and ACT verified > 250 seconds.    A 16 Fr transfemoral E-sheath was introduced into the right common femoral artery after progressively dilating over an Amplatz superstiff wire. An AL-2 catheter was used to direct a straight-tip exchange length wire across the native aortic valve into the left ventricle. This was exchanged out for a pigtail catheter and position was confirmed in the LV apex.  The pigtail catheter was exchanged for an Amplatz Extra-stiff wire in the LV apex.  Echocardiography was utilized to confirm appropriate wire position and no sign of entanglement in the mitral subvalvular apparatus.   TRANSCATHETER HEART VALVE DEPLOYMENT:   An Edwards Sapien 3 transcatheter heart valve (size 29 mm, model #[id number], serial #[id number]) was prepared and crimped per manufacturer's guidelines, and the proper orientation of the valve is confirmed on the [employer] delivery system. The valve was advanced through the introducer sheath using normal technique until in an appropriate position in the abdominal aorta beyond the sheath tip. The balloon was then retracted and using the fine-tuning wheel was centered on the valve. The valve was then advanced across the aortic arch using appropriate flexion of the catheter. The valve was carefully positioned across the aortic valve annulus. The Commander catheter was retracted using normal technique. Once final position of the valve has been confirmed by angiographic assessment, the valve is deployed while temporarily holding  ventilation and during rapid ventricular pacing to maintain systolic blood  pressure < 50 mmHg and pulse pressure < 10 mmHg. The balloon inflation is held for >3 seconds after reaching full deployment volume. Once the balloon has fully deflated the balloon is retracted into the ascending aorta and valve function is assessed using echocardiography. There is felt to be no paravalvular leak and no central aortic insufficiency.  The patient's hemodynamic recovery following valve deployment is good.  The deployment balloon and guidewire are both removed. Final intraoperative echocardiogram demostrated acceptable post-procedural gradients, stable mitral valve function, no aortic insufficiency, and stable LV systolic function.   PROCEDURE COMPLETION:   The sheath was removed and femoral artery closure performed by Dr Angelena Form. Please see his separate report for details.  Protamine was administered once femoral arterial repair was complete. The temporary pacemaker, pigtail catheters and femoral sheaths were removed with manual pressure used for hemostasis.   The patient tolerated the procedure well and is transported to the surgical intensive care in stable condition. There were no immediate intraoperative complications. All sponge instrument and needle counts are verified correct at completion of the operation.   No blood products were administered during the operation.  The patient received a total of 62.8 mL of intravenous contrast during the procedure.   Rexene Alberts, MD 03/04/2017 9:32 AM

## 2017-03-04 NOTE — Anesthesia Postprocedure Evaluation (Signed)
Anesthesia Post Note  Patient: Justin [name]  Procedure(s) Performed: Procedure(s) (LRB): TRANSCATHETER AORTIC VALVE REPLACEMENT, TRANSFEMORAL (N/A) TRANSESOPHAGEAL ECHOCARDIOGRAM (TEE) (N/A)     Patient location during evaluation: PACU Anesthesia Type: MAC Level of consciousness: awake, awake and alert and oriented Pain management: pain level controlled Vital Signs Assessment: post-procedure vital signs reviewed and stable Respiratory status: spontaneous breathing, nonlabored ventilation and respiratory function stable Cardiovascular status: blood pressure returned to baseline Anesthetic complications: no    Last Vitals:  Vitals:   03/04/17 1500 03/04/17 1557  BP: (!) 105/55 (!) 105/55  Pulse: (!) 53 (!) 47  Resp: 14 16  Temp:  (!) 36.3 C    Last Pain:  Vitals:   03/04/17 1557  TempSrc: Oral                 Gabrial Poppell COKER

## 2017-03-04 NOTE — H&P (View-Only) (Signed)
[facility]  MULTIDISCIPLINARY HEART VALVE CLINIC  CARDIOTHORACIC SURGERY CONSULTATION REPORT  Referring Provider is Burnell Blanks, MD PCP is Laurey Morale, MD  Chief Complaint  Patient presents with  . Aortic Stenosis    2nd TAVR eval, reveiw all studies, surgery scheduled for 03/04/17    HPI:  Patient is an 83 year old male with history of coronary artery disease status post coronary artery bypass grafting in 2007, aortic stenosis, previous myocardial infarction with chronic combined systolic and diastolic congestive heart failure, hypertension, hypothyroidism, and prostate cancer with been referred for second surgical opinion to discuss treatment options for management of severe low gradient low ejection fraction aortic stenosis.  The patient's cardiac history dates back approximately 18 years ago when he suffered an acute inferior wall myocardial infarction. He was treated medically and did well until 2007 when he presented with symptoms of angina pectoris. He was found to have severe left main and three-vessel coronary artery disease and underwent coronary artery bypass grafting 4 by Dr. Servando Snare. Grafts placed at the time of surgery included left internal mammary artery to the distal left anterior descending coronary artery, saphenous vein graft to diagonal branch, saphenous vein graft to the obtuse marginal branch of left circumflex coronary artery, and saphenous vein graft to the distal right coronary artery. At the time of surgery the patient was noted to have sclerosis of the aortic valve with no significant aortic stenosis. The patient did well and has remained stable from a cardiac standpoint until fairly recently.  Over the last 2-3 years the patient has developed exertional shortness of breath and chronic lower extremity edema. Symptoms gradually progressed and have been more problematic over the last 6 months. Transthoracic echocardiograms have documented the  presence of aortic stenosis that up until recently has been felt to be moderate in severity. In January of this year he was seen in follow-up by Dr. Angelena Form.  Diagnostic cardiac catheterization was performed revealing severe native coronary artery disease with chronic occlusion of left main coronary artery and the right coronary artery. All of the previously placed bypass grafts remained widely patent and free of significant disease.  Mean transvalvular gradient measured across the aortic valve was reported 17 mmHg. The patient was treated medically. Since then he has had further progression of symptoms of congestive heart failure, and in May he was hospitalized with acute exacerbation of chronic symptoms with resting shortness of breath and severe volume overload. He improved with diuretic therapy. Follow-up transthoracic echocardiogram performed at that time revealed the presence of aortic stenosis with peak velocity across the aortic valve measured a size 3.8 m/s corresponding to mean transvalvular gradient estimated 27 mmHg and aortic valve area calculated 0.57 cm. The DVI was reportedly quite low at 0.18 and left ventricular ejection fraction had notably dropped 40-45%, having previously been estimated 55-60% on the last previous echocardiogram performed in October 2017. The patient was referred for surgical consultation and evaluated previously by Dr. Servando Snare who felt that the patient would be at relatively high risk for conventional surgical aortic valve replacement. Transcatheter aortic valve replacement was discussed as an alternative and the patient was referred for second surgical opinion.  The patient is married and lives locally in [place] with his wife. 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 He does not exercise on a regular basis although he states that he remains functionally independent and  overall doing well  for a gentleman his age. Since his last hospitalization the patient has done better on medical therapy. He states that he now gets short of breath only with more strenuous physical exertion. His weight has been stable on his current regimen of diuretics. He denies any resting shortness breath, PND, orthopnea, palpitations, dizzy spells, or syncope. He has chronic lower extremity edema. He has never had any chest tightness or chest pressure other with activity or at rest.  Past Medical History:  Diagnosis Date  . Age-related macular degeneration, dry, both eyes   . Allergic    "24/7; 365 days/year; I'm allergic to pollens, dust, all southern grasses/trees, mold, mildue, cats, dogs" (01/07/2017)  . Anginal pain ([place])   . Asthma    sees [name]   . Benign prostatic hypertrophy    (sees Dr. Denman George  . CAD (coronary artery disease)    sees Dr. Haroldine Laws   . Carotid bruit    carotid u/s 10/10: 0.39% bilaterally  . CHF (congestive heart failure) ([place])   . Complication of anesthesia 1980s   "w/anal cyst OR, he gave me a saddle block then put a narcotic in spinal cord; had a severe reaction to that" (01/07/2017)  . Coronary artery disease involving native coronary artery of native heart without angina pectoris   . Dyspnea    with fluid buildup  . ED (erectile dysfunction)   . Family history of adverse reaction to anesthesia    "daughter wakes up during OR" (01/07/2017)  . GERD (gastroesophageal reflux disease)   . Gout   . Heart murmur   . HTN (hypertension)   . Hx of colonic polyps    (sees Dr. Henrene Pastor)  . Hyperlipidemia   . Hypothyroidism   . Leaky heart valve   . Myocardial infarction ([place]) ~ 2000  . Obesity   . Osteoarthritis    "was in my knees, hands" (01/07/2017 )  . Precancerous skin lesion    (sees Dr. Allyson Sabal)  . Prostate cancer ([place]) dx'd ~ 2014  . S/P CABG (coronary artery bypass graft) 2007    Coronary artery bypass grafting x4 with left internal([place]  Servando Snare, MD)  . 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    Past Surgical History:  Procedure Laterality Date  . ANUS SURGERY     "opened it back up cause it wouldn't heal; wound up w/a fissure" (01/07/2017)  . CARDIAC CATHETERIZATION  10/29/2005  . CARDIAC CATHETERIZATION N/A 08/27/2016   Procedure: Right/Left Heart Cath and Coronary/Graft Angiography;  Surgeon: Burnell Blanks, MD;  Location: [facility];  Service: Cardiovascular;  Laterality: N/A;  . CATARACT EXTRACTION W/ INTRAOCULAR LENS  IMPLANT, BILATERAL Bilateral   . COLONOSCOPY  06/30/2008   no repeats needed   . CORONARY ARTERY BYPASS GRAFT  2007   "CABG X4"  . CYST EXCISION PERINEAL  1980s  . HAMMER TOE SURGERY Bilateral   . JOINT REPLACEMENT    . KNEE ARTHROPLASTY  07/30/2011   Procedure: COMPUTER ASSISTED TOTAL KNEE ARTHROPLASTY;  Surgeon: Meredith Pel;  Location: [facility];  Service: Orthopedics;  Laterality: Left;  left total knee arthroplasty  . MASTECTOMY SUBCUTANEOUS Bilateral   . ORIF FINGER / THUMB FRACTURE Right ~ 1980   "repair of thumb injury"  . PROSTATE BIOPSY    . REPLACEMENT TOTAL KNEE BILATERAL Bilateral 2012    Family History  Problem Relation Age of Onset  . Heart attack Father 53  . Heart failure Mother 21  . Uterine cancer Mother  Social History   Social History  . Marital status: Married    Spouse name: N/A  . Number of children: N/A  . Years of education: N/A   Occupational History  . general contractor     builds malls   Social History Main Topics  . Smoking status: Former Smoker    Packs/day: 3.50    Years: 13.00    Types: Cigarettes    Quit date: 1963  . Smokeless tobacco: Never Used  . Alcohol use 0.0 oz/week     Comment: 01/07/2017 "might have 2 beers 2-3 times/month"  . Drug use: No  . Sexual activity: No   Other Topics Concern  . Not on file   Social History Narrative   FH of CAD, Male 1st degree relative less than age 12.    Current Outpatient Prescriptions    Medication Sig Dispense Refill  . aspirin 81 MG tablet Take 81 mg by mouth every evening.     [name] atorvastatin (LIPITOR) 20 MG tablet Take 20 mg by mouth daily.    . Azelastine-Fluticasone (DYMISTA) 137-50 MCG/ACT SUSP Place 2 puffs into the nose 2 (two) times daily. 1 Bottle 6  . benzonatate (TESSALON) 200 MG capsule Take 1 capsule (200 mg total) by mouth 2 (two) times daily as needed for cough. 60 capsule 5  . budesonide-formoterol (SYMBICORT) 160-4.5 MCG/ACT inhaler Inhale 2 puffs into the lungs 2 (two) times daily. 1 Inhaler 6  . budesonide-formoterol (SYMBICORT) 160-4.5 MCG/ACT inhaler Inhale 2 puffs into the lungs 2 (two) times daily.    . cetirizine (ZYRTEC) 10 MG tablet Take 10 mg by mouth daily.    . chlorpheniramine (CHLOR-TRIMETON) 4 MG tablet Take 2 tablets (8 mg total) by mouth 3 (three) times daily. (Patient taking differently: Take 8 mg by mouth 2 (two) times daily. ) 90 tablet 3  . furosemide (LASIX) 80 MG tablet Take 1 tablet (80 mg total) by mouth daily. 30 tablet 2  . isosorbide mononitrate (IMDUR) 30 MG 24 hr tablet Take 1 tablet (30 mg total) by mouth daily. 90 tablet 3  . levothyroxine (SYNTHROID, LEVOTHROID) 75 MCG tablet Take 1 tablet (75 mcg total) by mouth daily. 90 tablet 3  . losartan (COZAAR) 25 MG tablet Take 1 tablet (25 mg total) by mouth daily. 30 tablet 5  . metoprolol (LOPRESSOR) 50 MG tablet Take 1 tablet (50 mg total) by mouth 2 (two) times daily. 180 tablet 2  . montelukast (SINGULAIR) 10 MG tablet TAKE 1 TABLET IN THE EVENING. 60 tablet 5  . Multiple Vitamins-Minerals (PRESERVISION AREDS PO) Take 2 tablets by mouth every morning.    . potassium chloride SA (K-DUR,KLOR-CON) 20 MEQ tablet Take 2 tablets (40 mEq total) by mouth daily. 30 tablet 2  . ranitidine (ZANTAC) 150 MG capsule Take 150 mg by mouth 2 (two) times daily.     No current facility-administered medications for this visit.     Allergies  Allergen Reactions  . Amlodipine Swelling    Swelling  in ankles  . Peanut-Containing Drug Products Anaphylaxis  . Sulfonamide Derivatives Anaphylaxis  . Lisinopril Other (See Comments)    cough      Review of Systems:   General:  normal appetite, normal energy, no weight gain, + weight loss, no fever  Cardiac:  no chest pain with exertion, no chest pain at rest, + SOB with exertion, no resting SOB, no PND, no orthopnea, no palpitations, no arrhythmia, no atrial fibrillation, + LE edema, no dizzy spells,  no syncope  Respiratory:  + shortness of breath, no home oxygen, no productive cough, + dry cough, no bronchitis, + wheezing, no hemoptysis, no asthma, no pain with inspiration or cough, no sleep apnea, no CPAP at night  GI:   no difficulty swallowing, no reflux, no frequent heartburn, + hiatal hernia, no abdominal pain, no constipation, no diarrhea, no hematochezia, no hematemesis, no melena  GU:   no dysuria,  + frequency, no urinary tract infection, no hematuria, no enlarged prostate, no kidney stones, no kidney disease  Vascular:  no pain suggestive of claudication, no pain in feet, no leg cramps, no varicose veins, no DVT, no non-healing foot ulcer  Neuro:   no stroke, no TIA's, no seizures, no headaches, no temporary blindness one eye,  no slurred speech, no peripheral neuropathy, no chronic pain, no instability of gait, no memory/cognitive dysfunction  Musculoskeletal: no arthritis, no joint swelling, no myalgias, no difficulty walking, normal mobility   Skin:   no rash, no itching, no skin infections, no pressure sores or ulcerations  Psych:   no anxiety, no depression, no nervousness, no unusual recent stress  Eyes:   no blurry vision, no floaters, no recent vision changes, + wears glasses or contacts  ENT:   no hearing loss, no loose or painful teeth, no dentures, last saw dentist 02/04/2017  Hematologic:  no easy bruising, no abnormal bleeding, no clotting disorder, no frequent epistaxis  Endocrine:  no diabetes, does not check CBG's at  home           Physical Exam:   BP (!) 111/54   Pulse 70   Resp 20   Ht 5\' 7"  (1.702 m)   Wt 268 lb (121.6 kg)   SpO2 97% Comment: RA  BMI 41.97 kg/m   General:  Moderately obese,  well-appearing  HEENT:  Unremarkable   Neck:   no JVD, no bruits, no adenopathy   Chest:   clear to auscultation, symmetrical breath sounds, no wheezes, no rhonchi   CV:   RRR, grade IV/VI crescendo/decrescendo murmur heard best at RUSB,  no diastolic murmur  Abdomen:  soft, non-tender, no masses   Extremities:  warm, well-perfused, pulses palpable, mild LE edema  Rectal/GU  Deferred  Neuro:   Grossly non-focal and symmetrical throughout  Skin:   Clean and dry, no rashes, no breakdown   Diagnostic Tests:  Transthoracic Echocardiography  Patient:    Alexiz, Sustaita MR #:       [id number] Study Date: 01/09/2017 Gender:     M Age:        82 Height:     170.2 cm Weight:     124.7 kg BSA:        2.49 m^2 Pt. Status: Room:       3E16C   ATTENDING    Darlina Guys, MD  ADMITTING    Sonny Dandy, [place], [place], Inpatient  SONOGRAPHER  Alvino Chapel, RCS  cc:  ------------------------------------------------------------------- LV EF: 40% -   45%  ------------------------------------------------------------------- Indications:      CHF - 428.0.  ------------------------------------------------------------------- History:   PMH:   Coronary artery disease.  Aortic valve disease. PMH:   Myocardial infarction.  Risk factors:  Hypertension.  ------------------------------------------------------------------- Study Conclusions  - Left ventricle: The cavity size was normal. There was moderate   concentric hypertrophy. Systolic function was mildly to   moderately reduced. The  estimated ejection fraction was in the   range of 40% to 45%. Wall motion was normal; there were no   regional wall  motion abnormalities. Features are consistent with   a pseudonormal left ventricular filling pattern, with concomitant   abnormal relaxation and increased filling pressure (grade 2   diastolic dysfunction). Doppler parameters are consistent with   elevated ventricular end-diastolic filling pressure. - Ventricular septum: Septal motion showed paradox. - Aortic valve: Valve mobility was restricted. There was moderate   to severe stenosis. There was mild regurgitation. Mean gradient   (S): 27 mm Hg. Peak gradient (S): 58 mm Hg. Valve area (VTI):   0.63 cm^2. Valve area (Vmax): 0.57 cm^2. Valve area (Vmean): 0.66   cm^2. - Mitral valve: Calcified annulus. Mildly thickened leaflets . - Left atrium: The atrium was mildly dilated. - Right ventricle: The cavity size was mildly dilated. Wall   thickness was normal. Systolic function was moderately reduced.  Impressions:  - When compared to th eprior study from 05/31/2016 LVEF has   decreased from 55-60% to 40-45%.   Aortic valve is severely calcified and thickened with severely   restricted leaflet openings. Transaortic gradient consistent with   moderate stenosis, however AVA consistent with severe aortic   stenosis. Consider a low Dobutamine stress echocardiogram for   further evaluation.  ------------------------------------------------------------------- Study data:  Comparison was made to the study of 05/31/2016.  Study status:  Routine.  Procedure:  The patient reported no pain pre or post test. Transthoracic echocardiography. Image quality was fair. The study was technically difficult, as a result of poor sound wave transmission and body habitus. Intravenous contrast (Definity) was administered.  Study completion:  There were no complications.     Transthoracic echocardiography.  M-mode, complete 2D, spectral Doppler, and color Doppler.  Birthdate:  Patient birthdate: 1934-05-18.  Age:  Patient is 83 yr old.  Sex:  Gender:  male. BMI: 43 kg/m^2.  Blood pressure:     120/51  Patient status: Inpatient.  Study date:  Study date: 01/09/2017. Study time: 09:29 AM.  Location:  Bedside.  -------------------------------------------------------------------  ------------------------------------------------------------------- Left ventricle:  The cavity size was normal. There was moderate concentric hypertrophy. Systolic function was mildly to moderately reduced. The estimated ejection fraction was in the range of 40% to 45%. Wall motion was normal; there were no regional wall motion abnormalities. Features are consistent with a pseudonormal left ventricular filling pattern, with concomitant abnormal relaxation and increased filling pressure (grade 2 diastolic dysfunction). Doppler parameters are consistent with elevated ventricular end-diastolic filling pressure.  ------------------------------------------------------------------- Aortic valve:   Trileaflet; severely thickened, severely calcified leaflets. Valve mobility was restricted.  Doppler:   There was moderate to severe stenosis.   There was mild regurgitation.    VTI ratio of LVOT to aortic valve: 0.2. Valve area (VTI): 0.63 cm^2. Indexed valve area (VTI): 0.25 cm^2/m^2. Peak velocity ratio of LVOT to aortic valve: 0.18. Valve area (Vmax): 0.57 cm^2. Indexed valve area (Vmax): 0.23 cm^2/m^2. Mean velocity ratio of LVOT to aortic valve: 0.21. Valve area (Vmean): 0.66 cm^2. Indexed valve area (Vmean): 0.27 cm^2/m^2.    Mean gradient (S): 27 mm Hg. Peak gradient (S): 58 mm Hg.  ------------------------------------------------------------------- Aorta:  Aortic root: The aortic root was normal in size.  ------------------------------------------------------------------- Mitral valve:   Calcified annulus. Mildly thickened leaflets . Mobility was not restricted.  Doppler:  Transvalvular velocity was within the normal range. There was no evidence for  stenosis. There was trivial regurgitation.    Valve  area by pressure half-time: 3.73 cm^2. Indexed valve area by pressure half-time: 1.5 cm^2/m^2.   Peak gradient (D): 6 mm Hg.  ------------------------------------------------------------------- Left atrium:  The atrium was mildly dilated.  ------------------------------------------------------------------- Right ventricle:  The cavity size was mildly dilated. Wall thickness was normal. Systolic function was moderately reduced.  ------------------------------------------------------------------- Ventricular septum:   Septal motion showed paradox.  ------------------------------------------------------------------- Pulmonic valve:    Structurally normal valve.   Cusp separation was normal.  Doppler:  Transvalvular velocity was within the normal range. There was no evidence for stenosis. There was trivial regurgitation.  ------------------------------------------------------------------- Tricuspid valve:   Structurally normal valve.    Doppler: Transvalvular velocity was within the normal range. There was trivial regurgitation.  ------------------------------------------------------------------- Pulmonary artery:   The main pulmonary artery was normal-sized. Systolic pressure was within the normal range.  ------------------------------------------------------------------- Right atrium:  The atrium was normal in size.  ------------------------------------------------------------------- Pericardium:  There was no pericardial effusion.  ------------------------------------------------------------------- Systemic veins: Inferior vena cava: The vessel was dilated. The respirophasic diameter changes were blunted (< 50%), consistent with elevated central venous pressure.  ------------------------------------------------------------------- Measurements   Left ventricle                          Value           Reference   LV ID, ED, PLAX chordal                 49     mm       43 - 52  LV ID, ES, PLAX chordal                 30     mm       23 - 38  LV fx shortening, PLAX chordal          39     %        >=29  LV PW thickness, ED                     12     mm       ----------  IVS/LV PW ratio, ED                     1.08            <=1.3  Stroke volume, 2D                       58     ml       ----------  Stroke volume/bsa, 2D                   23     ml/m^2   ----------  LV ejection fraction, 1-p A4C           56     %        ----------  LV end-diastolic volume, 2-p            126    ml       ----------  LV end-systolic volume, 2-p             55     ml       ----------  LV ejection fraction, 2-p               57     %        ----------  Stroke volume, 2-p                      72     ml       ----------  LV end-diastolic volume/bsa,            51     ml/m^2   ----------  2-p  LV end-systolic volume/bsa, 2-p         22     ml/m^2   ----------  Stroke volume/bsa, 2-p                  28.7   ml/m^2   ----------  LV e&', lateral                          7.05   cm/s     ----------  LV E/e&', lateral                        17.59           ----------  LV e&', medial                           3.73   cm/s     ----------  LV E/e&', medial                         33.24           ----------  LV e&', average                          5.39   cm/s     ----------  LV E/e&', average                        23.01           ----------  LV ejection time                        410    ms       ----------    Ventricular septum                      Value           Reference  IVS thickness, ED                       13     mm       ----------    LVOT                                    Value           Reference  LVOT ID, S                              20     mm       ----------  LVOT area                               3.14   cm^2     ----------  LVOT peak velocity, S                   70.2   cm/s     ----------  LVOT mean  velocity, S                   48.7   cm/s     ----------  LVOT VTI, S                             18.4   cm       ----------  LVOT peak gradient, S                   2      mm Hg    ----------    Aortic valve                            Value           Reference  Aortic valve peak velocity, S           380.04 cm/s     ----------  Aortic valve mean velocity, S           230.19 cm/s     ----------  Aortic valve VTI, S                     93.21  cm       ----------  Aortic mean gradient, S                 27     mm Hg    ----------  Aortic peak gradient, S                 58     mm Hg    ----------  VTI ratio, LVOT/AV                      0.2             ----------  Aortic valve area, VTI                  0.63   cm^2     ----------  Aortic valve area/bsa, VTI              0.25   cm^2/m^2 ----------  Velocity ratio, peak, LVOT/AV           0.18            ----------  Aortic valve area, peak                 0.57   cm^2     ----------  velocity  Aortic valve area/bsa, peak             0.23   cm^2/m^2 ----------  velocity  Velocity ratio, mean, LVOT/AV           0.21            ----------  Aortic valve area, mean                 0.66   cm^2     ----------  velocity  Aortic valve area/bsa, mean             0.27   cm^2/m^2 ----------  velocity  Aortic regurg pressure  628    ms       ----------  half-time    Aorta                                   Value           Reference  Aortic root ID, ED                      36     mm       ----------    Left atrium                             Value           Reference  LA ID, A-P, ES                          44     mm       ----------  LA ID/bsa, A-P                          1.77   cm/m^2   <=2.2  LA volume, S                            97.4   ml       ----------  LA volume/bsa, S                        39.1   ml/m^2   ----------  LA volume, ES, 1-p A4C                  82.3   ml       ----------  LA volume/bsa, ES, 1-p A4C               33     ml/m^2   ----------  LA volume, ES, 1-p A2C                  106    ml       ----------  LA volume/bsa, ES, 1-p A2C              42.5   ml/m^2   ----------    Mitral valve                            Value           Reference  Mitral E-wave peak velocity             124    cm/s     ----------  Mitral A-wave peak velocity             34.4   cm/s     ----------  Mitral deceleration time                201    ms       150 - 230  Mitral pressure half-time               59     ms       ----------  Mitral peak gradient, D  6      mm Hg    ----------  Mitral E/A ratio, peak                  3.6             ----------  Mitral valve area, PHT, DP              3.73   cm^2     ----------  Mitral valve area/bsa, PHT, DP          1.5    cm^2/m^2 ----------    Pulmonary arteries                      Value           Reference  PA pressure, S, DP                      20     mm Hg    <=30    Tricuspid valve                         Value           Reference  Tricuspid regurg peak velocity          176    cm/s     ----------  Tricuspid peak RV-RA gradient           12     mm Hg    ----------    Right atrium                            Value           Reference  RA ID, S-I, ES, A4C             (H)     69.5   mm       34 - 49  RA area, ES, A4C                (H)     23.3   cm^2     8.3 - 19.5  RA volume, ES, A/L                      64.9   ml       ----------  RA volume/bsa, ES, A/L                  26     ml/m^2   ----------    Systemic veins                          Value           Reference  Estimated CVP                           8      mm Hg    ----------    Right ventricle                         Value           Reference  RV ID, ED, PLAX  35     mm       19 - 38  TAPSE                                   20.9   mm       ----------  RV pressure, S, DP                      20     mm Hg    <=30  RV s&', lateral, S                       10.4   cm/s      ----------  Legend: (L)  and  (H)  mark values outside specified reference range.  ------------------------------------------------------------------- Prepared and Electronically Authenticated by  Ena Dawley, M.D. 2018-05-17T12:12:11  Transthoracic Echocardiography  Patient:    Jareb, Radoncic MR #:       [id number] Study Date: 05/31/2016 Gender:     M Age:        36 Height:     172.7 cm Weight:     126.2 kg BSA:        2.52 m^2 Pt. Status: Room:   SONOGRAPHER  Wyatt Mage, RDCS  ATTENDING    Sonny Dandy, Christopher  REFERRING    McAlhany, Christopher  PERFORMING   [facility]  cc:  ------------------------------------------------------------------- LV EF: 55% -   60%  ------------------------------------------------------------------- Indications:      Aortic stenosis (I35).  ------------------------------------------------------------------- History:   PMH:   Murmur.  Coronary artery disease.  Aortic valve disease.  PMH:   Myocardial infarction.  Risk factors:  Former tobacco use. Hypertension. Dyslipidemia.  ------------------------------------------------------------------- Study Conclusions  - Procedure narrative: Transthoracic echocardiography. Image   quality was suboptimal. The study was technically difficult.   Intravenous contrast (Definity) was administered. - Left ventricle: The cavity size was normal. Wall thickness was   increased in a pattern of moderate LVH. Systolic function was   normal. The estimated ejection fraction was in the range of 55%   to 60%. Doppler parameters are consistent with a reversible   restrictive pattern, indicative of decreased left ventricular   diastolic compliance and/or increased left atrial pressure (grade   3 diastolic dysfunction). Doppler parameters are consistent with   high ventricular filling pressure. - Aortic valve: AV is thickened, calcified with  restricted motion   Difficult to see well Peak and mean gradients through the valve   are 41 and 28 mm Hg respectively consistent with moderate AS - Mitral valve: Calcified annulus. Mildly thickened leaflets . - Left atrium: The atrium was mildly dilated.  Impressions:  - COmpared to report of echo from 2016 there is no significant   change.  ------------------------------------------------------------------- Labs, prior tests, procedures, and surgery: Transthoracic echocardiography (02/06/2015).    The aortic valve showed moderate stenosis.  EF was 60%.  Coronary artery bypass grafting.  ------------------------------------------------------------------- Study data:  Comparison was made to the study of 02/06/2015.  Study status:  Routine.  Procedure:  The patient reported no pain pre or post test. Transthoracic echocardiography. Image quality was suboptimal. The study was technically difficult, as a result of poor sound wave transmission and body habitus. Intravenous contrast (Definity) was administered.  Study completion:  There were no complications.  Transthoracic echocardiography.  M-mode, complete 2D, spectral Doppler, and color Doppler.  Birthdate: Patient birthdate: [birth year].  Age:  Patient is 83 yr old.  Sex: Gender: male.    BMI: 42.3 kg/m^2.  Blood pressure:     124/60 Patient status:  Outpatient.  Study date:  Study date: 05/31/2016. Study time: 11:32 AM.  Location:  [facility]  -------------------------------------------------------------------  ------------------------------------------------------------------- Left ventricle:  The cavity size was normal. Wall thickness was increased in a pattern of moderate LVH. Systolic function was normal. The estimated ejection fraction was in the range of 55% to 60%. Doppler parameters are consistent with a reversible restrictive pattern, indicative of decreased left ventricular diastolic compliance  and/or increased left atrial pressure (grade 3 diastolic dysfunction). Doppler parameters are consistent with high ventricular filling pressure.  ------------------------------------------------------------------- Aortic valve:  AV is thickened, calcified with restricted motion Difficult to see well Peak and mean gradients through the valve are 41 and 28 mm Hg respectively consistent with moderate AS  Doppler: There was no significant regurgitation.    VTI ratio of LVOT to aortic valve: 0.33. Valve area (VTI): 1.35 cm^2. Indexed valve area (VTI): 0.54 cm^2/m^2. Peak velocity ratio of LVOT to aortic valve: 0.33. Valve area (Vmax): 1.35 cm^2. Indexed valve area (Vmax): 0.54 cm^2/m^2. Mean velocity ratio of LVOT to aortic valve: 0.31. Valve area (Vmean): 1.28 cm^2. Indexed valve area (Vmean): 0.51 cm^2/m^2.    Mean gradient (S): 27 mm Hg. Peak gradient (S): 41 mm Hg.  ------------------------------------------------------------------- Aorta:  The aorta was normal, not dilated, and non-diseased.  ------------------------------------------------------------------- Mitral valve:   Calcified annulus. Mildly thickened leaflets . Doppler:  There was trivial regurgitation.    Peak gradient (D): 6 mm Hg.  ------------------------------------------------------------------- Left atrium:  The atrium was mildly dilated.  ------------------------------------------------------------------- Right ventricle:  The cavity size was normal. Wall thickness was normal. Systolic function was normal.  ------------------------------------------------------------------- Pulmonic valve:    Structurally normal valve.   Cusp separation was normal.  Doppler:  Transvalvular velocity was within the normal range. There was mild regurgitation.  ------------------------------------------------------------------- Tricuspid valve:   Structurally normal valve.   Leaflet separation was normal.  Doppler:   Transvalvular velocity was within the normal range. There was trivial regurgitation.  ------------------------------------------------------------------- Right atrium:  The atrium was normal in size.  ------------------------------------------------------------------- Pericardium:  There was no pericardial effusion.   ------------------------------------------------------------------- Post procedure conclusions Ascending Aorta:  - The aorta was normal, not dilated, and non-diseased.  ------------------------------------------------------------------- Measurements   Left ventricle                            Value          Reference  LV ID, ED, PLAX chordal           (L)     41.6  mm       43 - 52  LV ID, ES, PLAX chordal                   29.5  mm       23 - 38  LV fx shortening, PLAX chordal            29    %        >=29  LV PW thickness, ED                       14.7  mm       ---------  IVS/LV PW ratio, ED                       1.08           <=1.3  Stroke volume, 2D                         117   ml       ---------  Stroke volume/bsa, 2D                     46    ml/m^2   ---------  LV e&', lateral                            9.32  cm/s     ---------  LV E/e&', lateral                          13.09          ---------  LV e&', medial                             6.57  cm/s     ---------  LV E/e&', medial                           18.57          ---------  LV e&', average                            7.95  cm/s     ---------  LV E/e&', average                          15.36          ---------    Ventricular septum                        Value          Reference  IVS thickness, ED                         15.9  mm       ---------    LVOT                                      Value          Reference  LVOT ID, S                                23    mm       ---------  LVOT area                                 4.15  cm^2     ---------  LVOT peak velocity, S                      104   cm/s     ---------  LVOT mean velocity, S                     75    cm/s     ---------  LVOT VTI, S                               28.1  cm       ---------    Aortic valve                              Value          Reference  Aortic valve peak velocity, S             320   cm/s     ---------  Aortic valve mean velocity, S             244   cm/s     ---------  Aortic valve VTI, S                       86.2  cm       ---------  Aortic mean gradient, S                   27    mm Hg    ---------  Aortic peak gradient, S                   41    mm Hg    ---------  VTI ratio, LVOT/AV                        0.33           ---------  Aortic valve area, VTI                    1.35  cm^2     ---------  Aortic valve area/bsa, VTI                0.54  cm^2/m^2 ---------  Velocity ratio, peak, LVOT/AV             0.33           ---------  Aortic valve area, peak velocity          1.35  cm^2     ---------  Aortic valve area/bsa, peak               0.54  cm^2/m^2 ---------  velocity  Velocity ratio, mean, LVOT/AV             0.31           ---------  Aortic valve area, mean velocity          1.28  cm^2     ---------  Aortic valve area/bsa, mean               0.51  cm^2/m^2 ---------  velocity    Aorta                                     Value          Reference  Aortic root ID, ED  37    mm       ---------  Ascending aorta ID, A-P, S                37    mm       ---------    Left atrium                               Value          Reference  LA ID, A-P, ES                            56    mm       ---------  LA ID/bsa, A-P                    (H)     2.22  cm/m^2   <=2.2  LA volume, S                              83.4  ml       ---------  LA volume/bsa, S                          33.1  ml/m^2   ---------  LA volume, ES, 1-p A4C                    66.6  ml       ---------  LA volume/bsa, ES, 1-p A4C                26.4  ml/m^2   ---------  LA volume, ES, 1-p A2C                     100   ml       ---------  LA volume/bsa, ES, 1-p A2C                39.6  ml/m^2   ---------    Mitral valve                              Value          Reference  Mitral E-wave peak velocity               122   cm/s     ---------  Mitral A-wave peak velocity               52.5  cm/s     ---------  Mitral deceleration time                  155   ms       150 - 230  Mitral peak gradient, D                   6     mm Hg    ---------  Mitral E/A ratio, peak                    2.3            ---------    Systemic veins  Value          Reference  Estimated CVP                             3     mm Hg    ---------    Right ventricle                           Value          Reference  TAPSE                                     9.76  mm       ---------  RV s&', lateral, S                         5.4   cm/s     ---------    Pulmonic valve                            Value          Reference  Pulmonic regurg velocity, ED              148   cm/s     ---------  Pulmonic regurg gradient, ED              9     mm Hg    ---------  Legend: (L)  and  (H)  mark values outside specified reference range.  ------------------------------------------------------------------- Prepared and Electronically Authenticated by  [name], M.D. 2017-10-06T16:28:47   Right/Left Heart Cath and Coronary/Graft Angiography  Conclusion     There is moderate aortic valve stenosis.  SVG graft was visualized by angiography and is normal in caliber.  1st Mrg lesion, 65 %stenosed.  Ost LM to LM lesion, 100 %stenosed.  SVG graft was visualized by angiography and is normal in caliber.  LIMA graft was visualized by angiography and is normal in caliber.  Prox RCA to Mid RCA lesion, 100 %stenosed.  SVG graft was visualized by angiography and is normal in caliber and anatomically normal.  Hemodynamic findings consistent with mild pulmonary hypertension.   1. Severe triple  vessel CAD with occluded left main and occluded mid RCA s/p 4V CABG with 4/4 patent bypass grafts.  2. The Left main is occluded at the ostium.  3. The LAD fills from the patent IMA graft and the Diagonal fills from the patent vein graft 4. The Circumflex fills from the patent vein graft to the OM. There is a moderate stenosis in the proximal segment of the OM branch proximal to the insertion of the vein graft.  5. The distal RCA fills from the patent vein graft.  6. Moderate AS 7. Elevated filling pressures  Recommendations: Will continue medical management of CAD. Will start Lasix 20 mg daily for mild volume overload. No indication for AVR at this time.    Indications   Coronary artery disease involving native coronary artery of native heart with unstable angina pectoris ([place]) [I25.110 (ICD-10-CM)]  Procedural Details/Technique   Technical Details Indication: 83 yo male with history of CAD s/p CABG in 2007, moderate AS with recent worsened dyspnea with exertion.   Procedure: The risks, benefits, complications, treatment options, and expected outcomes  were discussed with the patient. The patient and/or family concurred with the proposed plan, giving informed consent. The patient was brought to the cath lab after IV hydration was begun and oral premedication was given. The patient was further sedated with Versed and Fentanyl. The right groin was prepped and draped in the usual manner. Using the modified Seldinger access technique, a 5 French sheath was placed in the right femoral artery. A 7 French sheath was placed in the right femoral vein. Right heart catheterization performed with a balloon tipped catheter. Standard diagnostic catheters were used to perform selective coronary angiography. I engaged the RCA and both left sided vein grafts with the JR4 catheter. I engaged the vein graft to the PDA with a multi-purpose catheter. I could not engage the left main which appeared to be occluded at  the ostium. I non-selectively imaged the LIMA graft with a LIMA catheter. I easily crossed the aortic valve with the JR4 catheter. There were no immediate complications. The patient was taken to the recovery area in stable condition.    Estimated blood loss <50 mL.  During this procedure the patient was administered the following to achieve and maintain moderate conscious sedation: Versed 1 mg, Fentanyl 25 mcg, while the patient's heart rate, blood pressure, and oxygen saturation were continuously monitored. The period of conscious sedation was 49 minutes, of which I was present face-to-face 100% of this time.    Complications   Complications documented before study signed (08/27/2016 11:42 AM EST)    RIGHT/LEFT HEART CATH AND CORONARY/GRAFT ANGIOGRAPHY   None Documented by Burnell Blanks, MD 08/27/2016 11:41 AM EST  Time Range: Intra-procedure      Coronary Findings   Dominance: Right  Left Main  Ost LM to LM lesion, 100% stenosed. The lesion is chronically occluded.  Left Anterior Descending  First Diagonal Branch  Vessel is moderate in size.  Second Diagonal Branch  Vessel is small in size.  Third Diagonal Branch  Vessel is small in size.  Left Circumflex  First Obtuse Marginal Branch  Vessel is large in size.  1st Mrg lesion, 65% stenosed.  Second Obtuse Marginal Branch  Vessel is small in size.  Third Obtuse Marginal Branch  Vessel is moderate in size.  Right Coronary Artery  Vessel is large.  Prox RCA to Mid RCA lesion, 100% stenosed. The lesion is chronically occluded.  Right Posterior Descending Artery  Vessel is moderate in size.  Graft Angiography  saphenous Graft to 1st Mrg  SVG graft was visualized by angiography and is normal in caliber.  saphenous Graft to 1st Diag  SVG graft was visualized by angiography and is normal in caliber.  Free LIMA Graft to Dist LAD  LIMA graft was visualized by angiography and is normal in caliber.  saphenous Graft to  RPDA  SVG graft was visualized by angiography and is normal in caliber and anatomically normal.  Right Heart   Right Heart Pressures Hemodynamic findings consistent with mild pulmonary hypertension. Elevated LV EDP consistent with volume overload.    Left Heart   Aortic Valve There is moderate aortic valve stenosis.    Coronary Diagrams   Diagnostic Diagram       Implants     No implant documentation for this case.  PACS Images   Show images for Cardiac catheterization   Link to Procedure Log   Procedure Log    Hemo Data    Most Recent Value  Fick Cardiac Output 5.34 L/min  Fick  Cardiac Output Index 2.25 (L/min)/BSA  Aortic Mean Gradient 17 mmHg  Aortic Peak Gradient 21 mmHg  Aortic Valve Area 1.61  Aortic Value Area Index 0.68 cm2/BSA  RA A Wave 20 mmHg  RA V Wave 20 mmHg  RA Mean 18 mmHg  RV Systolic Pressure 54 mmHg  RV Diastolic Pressure 12 mmHg  RV EDP 16 mmHg  PA Systolic Pressure 44 mmHg  PA Diastolic Pressure 20 mmHg  PA Mean 32 mmHg  PW A Wave 28 mmHg  PW V Wave 42 mmHg  PW Mean 25 mmHg  AO Systolic Pressure 063 mmHg  AO Diastolic Pressure 55 mmHg  AO Mean 88 mmHg  LV Systolic Pressure 016 mmHg  LV Diastolic Pressure 3 mmHg  LV EDP 25 mmHg  Arterial Occlusion Pressure Extended Systolic Pressure 010 mmHg  Arterial Occlusion Pressure Extended Diastolic Pressure 60 mmHg  Arterial Occlusion Pressure Extended Mean Pressure 87 mmHg  Left Ventricular Apex Extended Systolic Pressure 932 mmHg  Left Ventricular Apex Extended Diastolic Pressure 12 mmHg  Left Ventricular Apex Extended EDP Pressure 29 mmHg  QP/QS 1  TPVR Index 14.2 HRUI  TSVR Index 39.06 HRUI  PVR SVR Ratio 0.1  TPVR/TSVR Ratio 0.36     Cardiac TAVR CT  TECHNIQUE: The patient was scanned on a Philips 256 scanner. A 120 kV retrospective scan was triggered in the descending thoracic aorta at 111 HU's. Gantry rotation speed was 270 msecs and collimation was .9 mm. No beta blockade  or nitro were given. The 3D data set was reconstructed in 5% intervals of the R-R cycle. Systolic and diastolic phases were analyzed on a dedicated work station using MPR, MIP and VRT modes. The patient received 80 cc of contrast.  FINDINGS: Aortic Valve: Trileaflet, severely thickened, moderately calcified aortic valve with minimal calcifications extending into the LVOT.  Aorta: Normal size, moderate diffuse calcifications, diffuse atherosclerotic plaque, focal severe protruding plaque in the aortic arch.  Sinotubular Junction:  31 x 31 mm  Ascending Thoracic Aorta:  36 x 36 mm  Aortic Arch:  30 x 26 mm  Descending Thoracic Aorta:  26 x 26 mm  Sinus of Valsalva Measurements:  Non-coronary:  37 mm  Right -coronary:  36 mm  Left -coronary:  36 mm  Coronary Artery Height above Annulus:  Left Main:  13 mm  Right Coronary:  15 mm  Virtual Basal Annulus Measurements:  Maximum/Minimum Diameter:  31 x 25 mm  Perimeter:  90 mm  Area:  619 mm2  Optimum Fluoroscopic Angle for Delivery:  LAO 9 CAU 7  IMPRESSION: 1. Trileaflet, severely thickened, moderately calcified aortic valve with minimal calcifications extending into the LVOT. Annular measurements suitable for delivery of a 29 mm Edwards-SAPIEN 3 valve.  2. Sufficient annulus to coronary distance.  3. Optimum Fluoroscopic Angle for Delivery:  LAO 9 CAU 7  4. A large left atrial appendage has no evidence for a thrombus.  5. Dilated pulmonary artery measuring 35 x 31 mm suggestive of pulmonary hypertension.  Ena Dawley   Electronically Signed   By: Ena Dawley   On: 01/28/2017 13:16   CT ANGIOGRAPHY CHEST, ABDOMEN AND PELVIS  TECHNIQUE: Multidetector CT imaging through the chest, abdomen and pelvis was performed using the standard protocol during bolus administration of intravenous contrast. Multiplanar reconstructed images and MIPs were obtained and reviewed to  evaluate the vascular anatomy.  CONTRAST:  70 mL of Isovue 370.  COMPARISON:  No priors.  FINDINGS: CTA CHEST FINDINGS  Cardiovascular: Heart  size is mildly enlarged. There is no significant pericardial fluid, thickening or pericardial calcification. There is aortic atherosclerosis, as well as atherosclerosis of the great vessels of the mediastinum and the coronary arteries, including calcified atherosclerotic plaque in the left main, left anterior descending, left circumflex and right coronary arteries. Status post median sternotomy for CABG including LIMA to the LAD. Thickening calcification of the aortic valve.  Mediastinum/Lymph Nodes: No pathologically enlarged mediastinal or hilar lymph nodes. Small hiatal hernia. No axillary lymphadenopathy.  Lungs/Pleura: Mild scarring in the periphery of the left upper lobe. No acute consolidative airspace disease. No pleural effusions. No suspicious appearing pulmonary nodules or masses.  Musculoskeletal/Soft Tissues: Median sternotomy wires. There are no aggressive appearing lytic or blastic lesions noted in the visualized portions of the skeleton.  CTA ABDOMEN AND PELVIS FINDINGS  Hepatobiliary: No cystic or solid hepatic lesions. No intra or extrahepatic biliary ductal dilatation. Gallbladder is normal in appearance.  Pancreas: No pancreatic mass. No pancreatic ductal dilatation. No pancreatic or peripancreatic fluid or inflammatory changes.  Spleen: Unremarkable.  Adrenals/Urinary Tract: Bilateral kidneys and bilateral adrenal glands are normal in appearance. No hydroureteronephrosis. Urinary bladder is normal in appearance.  Stomach/Bowel: The appearance of the stomach is normal. There is no pathologic dilatation of small bowel or colon. Numerous colonic diverticulae are noted, particularly in the sigmoid colon, without surrounding inflammatory changes to suggest an acute diverticulitis at this time. Normal  appendix.  Vascular/Lymphatic: Aortic atherosclerosis, with vascular findings and measurements pertinent to potential TAVR procedure, as detailed below. No aneurysm or dissection identified in the abdominal or pelvic vasculature. Densely calcified plaque at the origin of the celiac axis, the appearance of which suggests the presence of hemodynamically significant stenosis (because of the degree of calcification, accurate assessment of severity of stenosis is not possible on today's examination). Similarly, there is a densely calcified plaque at the ostium of the superior mesenteric artery, which also likely causes significant stenosis which is difficult to assess because of the severity of calcification. Inferior mesenteric artery appears to be widely patent without hemodynamically significant stenosis. Single renal arteries bilaterally. Left renal artery is widely patent without hemodynamically significant stenosis. Mild to moderate stenosis at the ostium of the right renal artery secondary to eccentric calcified plaque. No lymphadenopathy noted in the abdomen or pelvis.  Reproductive: Prostate gland and seminal vesicles are unremarkable in appearance.  Other: No significant volume of ascites.  No pneumoperitoneum.  Musculoskeletal: There are no aggressive appearing lytic or blastic lesions noted in the visualized portions of the skeleton.  VASCULAR MEASUREMENTS PERTINENT TO TAVR:  AORTA:  Minimal Aortic Diameter -  16 x 17 mm  Severity of Aortic Calcification -  severe  RIGHT PELVIS:  Right Common Iliac Artery -  Minimal Diameter - 13.4 x 13.4 mm  Tortuosity - mild  Calcification - mild  Right External Iliac Artery -  Minimal Diameter - 8.9 x 9.7 mm  Tortuosity - mild  Calcification - none  Right Common Femoral Artery -  Minimal Diameter - 9.8 x 8.5 mm  Tortuosity - mild  Calcification - mild  LEFT PELVIS:  Left Common Iliac  Artery -  Minimal Diameter - 14.6 x 8.8 mm  Tortuosity - mild  Calcification - mild  Left External Iliac Artery -  Minimal Diameter - 9.8 x 9.6 mm  Tortuosity - mild  Calcification - none  Left Common Femoral Artery -  Minimal Diameter - 9.4 x 9.3 mm  Tortuosity - mild  Calcification - mild  Review of the MIP images confirms the above findings.  IMPRESSION: 1. Vascular findings and measurements pertinent to potential TAVR procedure, as detailed above. This patient does appear to have suitable pelvic arterial access bilaterally. 2. Thickening calcification of the aortic valve, compatible with the reported clinical history of severe aortic stenosis. 3. Aortic atherosclerosis, in addition to left main and 3 vessel coronary artery disease. Status post median sternotomy for CABG including LIMA to the LAD. 4. 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 5. Extensive colonic diverticulosis without evidence to suggest an acute diverticulitis at this time. 6. Additional incidental findings, as above.   Electronically Signed   By: Vinnie Langton M.D.   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 He presents with a long history of symptoms of exertional shortness of breath and lower extremity edema consistent with chronic combined systolic and diastolic congestive heart failure. He was hospitalized in early May with an acute  exacerbation of symptoms at rest consistent with class IV CHF. Over the last several weeks he has been doing better on medical therapy but he continues to experience exertional shortness of breath and lower extremity edema consistent with class II CHF. I have personally reviewed the patient's recent transthoracic echocardiogram, diagnostic cardiac catheterization, and CT angiograms. Echocardiogram performed in May of this year demonstrates severe thickening, calcification, and restricted leaflet mobility involving all 3 leaflets of the patient's aortic valve. Peak velocity across the aortic valve was measured as high as 3.8 m/s corresponding to mean transvalvular gradient estimated 27 mmHg. The DVI was measured quite low at 0.18.  Although the peak velocity was slightly less than 4 m/s and the mean transvalvular gradient remained less than 40 mmHg, the patient's ejection fraction dropped significantly over the past 6 months. I agree that this likely represents the presence of low gradient severe symptomatic aortic stenosis, and I share Dr. Camillia Herter concerns regarding whether or not it would be wise or necessary to perform dobutamine stress echocardiography under the circumstances. Diagnostic cardiac catheterization performed last January confirmed the presence of severe native vessel coronary artery disease with chronic occlusion of left main coronary artery and the right coronary artery. However, all of the bypass grafts placed at the time of the patient's coronary artery bypass surgery in 2017 remained widely patent and free of significant disease.  Under the circumstances, I agree that the patient would likely benefit from aortic valve replacement. Risks associated with conventional surgery would be at least moderately elevated because of the patient's age, moderately reduced left ventricular function, and previous coronary artery bypass surgery.  Cardiac-gated CTA of the heart reveals anatomical  characteristics consistent with aortic stenosis suitable for treatment by transcatheter aortic valve replacement without any significant complicating features and CTA of the aorta and iliac vessels demonstrate what appears to be adequate pelvic vascular access to facilitate a transfemoral approach.   Plan:  The  patient and his family were counseled at length regarding treatment alternatives for management of severe symptomatic aortic stenosis. Alternative approaches such as conventional aortic valve replacement, transcatheter aortic valve replacement, and palliative medical therapy were compared and contrasted at length.  The risks associated with conventional surgical aortic valve replacement were been discussed in detail, as were expectations for post-operative convalescence, and why I would be reluctant to consider this patient a candidate for conventional surgery.  Issues specific to transcatheter aortic valve replacement were discussed including questions about long term valve durability, the potential for paravalvular leak, possible increased risk of need for permanent pacemaker placement, and other technical complications related to the procedure itself.  Long-term prognosis with medical therapy was discussed. This discussion was placed in the context of the patient's own specific clinical presentation and past medical history.  All of their questions been addressed.  Following the decision to proceed with transcatheter aortic valve replacement, a discussion has been held regarding what types of management strategies would be attempted intraoperatively in the event of life-threatening complications, including whether or not the patient would be considered a candidate for the use of cardiopulmonary bypass and/or conversion to open sternotomy for attempted surgical intervention.  The patient has been advised of a variety of complications that might develop including but not limited to risks of death,  stroke, paravalvular leak, aortic dissection or other major vascular complications, aortic annulus rupture, device embolization, cardiac rupture or perforation, mitral regurgitation, acute myocardial infarction, arrhythmia, heart block or bradycardia requiring permanent pacemaker placement, congestive heart failure, respiratory failure, renal failure, pneumonia, infection, other late complications related to structural valve deterioration or migration, or other complications that might ultimately cause a temporary or permanent loss of functional independence or other long term morbidity.  The patient provides full informed consent for the procedure as described and all questions were answered.    I spent in excess of 90 minutes during the conduct of this office consultation and >50% of this time involved direct face-to-face encounter with the patient for counseling and/or coordination of their care.   Valentina Gu. Roxy Manns, MD 03/03/2017 10:29 AM

## 2017-03-04 NOTE — Anesthesia Procedure Notes (Addendum)
Arterial Line Insertion Start/End7/05/2017 7:45 AM, 03/04/2017 7:48 AM Performed by: Linna Caprice Kyana Aicher, anesthesiologist  Patient location: OR. Preanesthetic checklist: patient identified, IV checked, site marked, risks and benefits discussed, surgical consent, monitors and equipment checked, pre-op evaluation, timeout performed and anesthesia consent Lidocaine 1% used for infiltration and patient sedated Right, brachial was placed Catheter size: 20 G Hand hygiene performed , maximum sterile barriers used  and Seldinger technique used Allen's test indicative of satisfactory collateral circulation Attempts: 1 Procedure performed using ultrasound guided technique. Following insertion, dressing applied and Biopatch. Patient tolerated the procedure well with no immediate complications.

## 2017-03-04 NOTE — Transfer of Care (Signed)
Immediate Anesthesia Transfer of Care Note  Patient: Justin [name]  Procedure(s) Performed: Procedure(s): TRANSCATHETER AORTIC VALVE REPLACEMENT, TRANSFEMORAL (N/A) TRANSESOPHAGEAL ECHOCARDIOGRAM (TEE) (N/A)  Patient Location: PACU and ICU  Anesthesia Type:MAC  Level of Consciousness: drowsy  Airway & Oxygen Therapy: Patient Spontanous Breathing and Patient connected to nasal cannula oxygen  Post-op Assessment: Report given to RN and Post -op Vital signs reviewed and stable  Post vital signs: Reviewed and stable  Last Vitals:  Vitals:   03/04/17 0549  BP: 128/65  Pulse: (!) 59  Resp: 20  Temp: 36.6 C    Last Pain:  Vitals:   03/04/17 0549  TempSrc: Oral         Complications: No apparent anesthesia complications

## 2017-03-04 NOTE — Progress Notes (Addendum)
Upon bedside report pt complaining of 7/10 right shoulder pain that throbs down his right shoulder. Pt also has oozing at his right groin sight that was marked and has now gotten bigger. Pressure held on right groin for 10 minutes. Stat EKG done, MD notified.    Dressing on right groin changed and no further bleeding. No hematoma. Will continue to monitor closely. MD to bedside to evaluate pt.   Will continue to monitor closely.  Lucius Conn, RN

## 2017-03-04 NOTE — Progress Notes (Signed)
[facility] BRIEF SICU PROGRESS NOTE  Day of Surgery  S/P Procedure(s) (LRB): TRANSCATHETER AORTIC VALVE REPLACEMENT, TRANSFEMORAL (N/A) TRANSESOPHAGEAL ECHOCARDIOGRAM (TEE) (N/A)   Some right shoulder pain early this afternoon which resolved Feels well presently, no complaints NSR w/ stable BP No murmur on exam Both groins look good  Plan: Continue routine early postop  Rexene Alberts, MD 03/04/2017 6:59 PM

## 2017-03-04 NOTE — Anesthesia Preprocedure Evaluation (Addendum)
Anesthesia Evaluation  Patient identified by MRN, date of birth, ID band Patient awake    Reviewed: Allergy & Precautions, NPO status , Patient's Chart, lab work & pertinent test results, reviewed documented beta blocker date and time   History of Anesthesia Complications (+) Family history of anesthesia reaction  Airway Mallampati: II  TM Distance: >3 FB Neck ROM: Full    Dental  (+) Teeth Intact, Dental Advisory Given   Pulmonary former smoker,    breath sounds clear to auscultation       Cardiovascular hypertension, Pt. on medications and Pt. on home beta blockers + angina + CAD, + Past MI and +CHF  + Valvular Problems/Murmurs AS  Rhythm:Regular Rate:Normal + Systolic murmurs    Neuro/Psych    GI/Hepatic GERD  Medicated and Controlled,  Endo/Other  Hypothyroidism   Renal/GU      Musculoskeletal   Abdominal   Peds  Hematology   Anesthesia Other Findings   Reproductive/Obstetrics                            Anesthesia Physical Anesthesia Plan  ASA: IV  Anesthesia Plan: MAC   Post-op Pain Management:    Induction:   PONV Risk Score and Plan: 1 and Ondansetron and Dexamethasone  Airway Management Planned: Simple Face Mask  Additional Equipment: Arterial line and TEE  Intra-op Plan:   Post-operative Plan: Extubation in OR  Informed Consent:   Dental advisory given  Plan Discussed with: CRNA, Anesthesiologist and Surgeon  Anesthesia Plan Comments:         Anesthesia Quick Evaluation

## 2017-03-04 NOTE — Progress Notes (Signed)
  Echocardiogram 2D Echocardiogram has been performed.  Justin [name] 03/04/2017, 9:33 AM

## 2017-03-04 NOTE — Anesthesia Procedure Notes (Addendum)
Central Venous Catheter Insertion Performed by: Roberts Gaudy, anesthesiologist Start/End7/05/2017 7:00 AM, 03/04/2017 7:05 AM Patient location: Pre-op. Preanesthetic checklist: patient identified, IV checked, site marked, risks and benefits discussed, surgical consent, monitors and equipment checked, pre-op evaluation, timeout performed and anesthesia consent Position: supine Lidocaine 1% used for infiltration and patient sedated Hand hygiene performed  and maximum sterile barriers used  Double lumen Procedure performed using ultrasound guided technique. Ultrasound Notes:anatomy identified, no ultrasound evidence of intravascular and/or intraneural injection and image(s) printed for medical record Attempts: 1 Following insertion, dressing applied, Biopatch and line sutured. Post procedure assessment: blood return through all ports, free fluid flow and no air  Patient tolerated the procedure well with no immediate complications.

## 2017-03-05 ENCOUNTER — Inpatient Hospital Stay (HOSPITAL_COMMUNITY): Payer: Medicare Other

## 2017-03-05 ENCOUNTER — Encounter (HOSPITAL_COMMUNITY): Payer: Self-pay | Admitting: Cardiovascular Disease

## 2017-03-05 DIAGNOSIS — Z952 Presence of prosthetic heart valve: Secondary | ICD-10-CM

## 2017-03-05 DIAGNOSIS — Z954 Presence of other heart-valve replacement: Secondary | ICD-10-CM

## 2017-03-05 DIAGNOSIS — I360 Nonrheumatic tricuspid (valve) stenosis: Secondary | ICD-10-CM

## 2017-03-05 DIAGNOSIS — I350 Nonrheumatic aortic (valve) stenosis: Secondary | ICD-10-CM

## 2017-03-05 LAB — ECHOCARDIOGRAM COMPLETE
Height: 67 in
Weight: 4244.8 oz

## 2017-03-05 LAB — BASIC METABOLIC PANEL
Anion gap: 6 (ref 5–15)
BUN: 9 mg/dL (ref 6–20)
CHLORIDE: 104 mmol/L (ref 101–111)
CO2: 27 mmol/L (ref 22–32)
CREATININE: 0.8 mg/dL (ref 0.61–1.24)
Calcium: 8.1 mg/dL — ABNORMAL LOW (ref 8.9–10.3)
GFR calc Af Amer: >60 mL/min (ref >60)
GFR calc non Af Amer: >60 mL/min (ref >60)
Glucose, Bld: 109 mg/dL — ABNORMAL HIGH (ref 65–99)
Potassium: 3.9 mmol/L (ref 3.5–5.1)
SODIUM: 137 mmol/L (ref 135–145)

## 2017-03-05 LAB — CBC
HEMATOCRIT: 40.2 % (ref 39.0–52.0)
HEMOGLOBIN: 12.7 g/dL — AB (ref 13.0–17.0)
MCH: 26.9 pg (ref 26.0–34.0)
MCHC: 31.6 g/dL (ref 30.0–36.0)
MCV: 85.2 fL (ref 78.0–100.0)
Platelets: 164 10*3/uL (ref 150–400)
RBC: 4.72 MIL/uL (ref 4.22–5.81)
RDW: 16.1 % — ABNORMAL HIGH (ref 11.5–15.5)
WBC: 11.3 10*3/uL — ABNORMAL HIGH (ref 4.0–10.5)

## 2017-03-05 LAB — MAGNESIUM: Magnesium: 2 mg/dL (ref 1.7–2.4)

## 2017-03-05 MED ORDER — AMIODARONE HCL IN DEXTROSE 360-4.14 MG/200ML-% IV SOLN: 30.0000 mg/h | INTRAVENOUS | Status: DC

## 2017-03-05 MED ORDER — AMIODARONE HCL IN DEXTROSE 360-4.14 MG/200ML-% IV SOLN
INTRAVENOUS | Status: AC
  Administered 2017-03-05: 150 mg via INTRAVENOUS
  Filled 2017-03-05: qty 200

## 2017-03-05 MED ORDER — DILTIAZEM HCL 100 MG IV SOLR
10.0000 mg/h | INTRAVENOUS | Status: DC
  Administered 2017-03-05: 10 mg/h via INTRAVENOUS
  Filled 2017-03-05: qty 100

## 2017-03-05 MED ORDER — AMIODARONE HCL IN DEXTROSE 360-4.14 MG/200ML-% IV SOLN
60.0000 mg/h | INTRAVENOUS | Status: DC
  Filled 2017-03-05: qty 200

## 2017-03-05 MED ORDER — AMIODARONE LOAD VIA INFUSION
150.0000 mg | Freq: Once | INTRAVENOUS | Status: AC
  Administered 2017-03-05: 150 mg via INTRAVENOUS

## 2017-03-05 MED ORDER — FUROSEMIDE 80 MG PO TABS
80.0000 mg | ORAL_TABLET | Freq: Every day | ORAL | Status: DC
  Administered 2017-03-06 – 2017-03-07 (×2): 80 mg via ORAL
  Filled 2017-03-05: qty 4
  Filled 2017-03-05: qty 1

## 2017-03-05 MED ORDER — LORAZEPAM 1 MG PO TABS
1.0000 mg | ORAL_TABLET | Freq: Four times a day (QID) | ORAL | Status: DC | PRN
  Administered 2017-03-06: 1 mg via ORAL
  Filled 2017-03-05: qty 1

## 2017-03-05 MED FILL — Sodium Chloride IV Soln 0.9%: INTRAVENOUS | Qty: 100 | Status: AC

## 2017-03-05 MED FILL — Insulin Regular (Human) Inj 100 Unit/ML: INTRAMUSCULAR | Qty: 1 | Status: AC

## 2017-03-05 NOTE — Progress Notes (Signed)
   The patient went into atrial fib without symptoms tonight.  He has no symptoms.  He is in no distress.  He reports to me that he might feel a little quivering in his chest but no pain or SOB.  IV Dilt did not slow him down.  I will give IV amiodarone bolus and drip.

## 2017-03-05 NOTE — Progress Notes (Signed)
Hochrein w/Cards text-paged regarding change in pt's EKG changes. Pt went into what appears to be sustained SVT ~21:10. Beforehand had a few 2-3second spurts of SVT preceded by R-on-T PVCs. 12-lead obtained.  Pt not in distress, BP stable. Awaiting MD response.  Will continue to monitor closely.  Henreitta Leber, RN 03/05/17

## 2017-03-05 NOTE — Progress Notes (Signed)
      [address]       [place] [address]             [phone]        CARDIOTHORACIC SURGERY PROGRESS NOTE   R1 Day Post-Op Procedure(s) (LRB): TRANSCATHETER AORTIC VALVE REPLACEMENT, TRANSFEMORAL (N/A) TRANSESOPHAGEAL ECHOCARDIOGRAM (TEE) (N/A)  Subjective: Looks good and feels well.  No complaints  Objective: Vital signs: BP Readings from Last 1 Encounters:  03/05/17 (!) 122/59   Pulse Readings from Last 1 Encounters:  03/05/17 79   Resp Readings from Last 1 Encounters:  03/05/17 16   Temp Readings from Last 1 Encounters:  03/05/17 98.5 F (36.9 C) (Oral)    Hemodynamics:    Physical Exam:  Rhythm:   Sinus w/ frequent PVC's  Breath sounds: clear  Heart sounds:  RRR w/out murmur  Incisions:  Both groins okay  Abdomen:  Soft, non-distended, non-tender  Extremities:  Warm, well-perfused    Intake/Output from previous day: 07/10 0701 - 07/11 0700 In: 919.2 [P.O.:100; I.V.:819.2] Out: 925 [Urine:925] Intake/Output this shift: No intake/output data recorded.  Lab Results:  CBC: Recent Labs  03/04/17 1042 03/04/17 1057 03/05/17 0350  WBC 13.9*  --  11.3*  HGB 12.8* 12.9* 12.7*  HCT 39.6 38.0* 40.2  PLT 184  --  164    BMET:  Recent Labs  03/04/17 0903 03/04/17 1057 03/05/17 0350  NA 140 141 137  K 3.8 3.9 3.9  CL 101  --  104  CO2  --   --  27  GLUCOSE 138* 119* 109*  BUN 11  --  9  CREATININE 0.50*  --  0.80  CALCIUM  --   --  8.1*     PT/INR:   Recent Labs  03/04/17 1042  LABPROT 15.6*  INR 1.23    CBG (last 3)  No results for input(s): GLUCAP in the last 72 hours.  ABG    Component Value Date/Time   PHART 7.475 (H) 02/27/2017 1422   PCO2ART 36.4 02/27/2017 1422   PO2ART 69.5 (L) 02/27/2017 1422   HCO3 26.5 02/27/2017 1422   TCO2 28 03/04/2017 0903   O2SAT 95.0 02/27/2017 1422    CXR: Clear  EKG: NSR w/out acute ischemic changes, 1st degree AV block and new AV conduction delay     Assessment/Plan: S/P Procedure(s) (LRB): TRANSCATHETER AORTIC VALVE REPLACEMENT, TRANSFEMORAL (N/A) TRANSESOPHAGEAL ECHOCARDIOGRAM (TEE) (N/A)  Overall doing well POD1 Maintaining NSR w/ stable BP, new partial AV conduction delay Expected post op acute blood loss anemia, mild Chronic combined systolic and diastolic CHF with expected post-op volume excess, mild   Agree w/ plans per Dr Angelena Form  Hold beta blockers  ASA and Plavix  Routine POD1 ECHO  Transfer tele  Anticipate likely d/c home tomorrow  I spent in excess of 15 minutes during the conduct of this hospital encounter and >50% of this time involved direct face-to-face encounter with the patient for counseling and/or coordination of their care.  Rexene Alberts, MD 03/05/2017 8:37 AM

## 2017-03-05 NOTE — Progress Notes (Signed)
Dr. Percival Spanish called back, discuss pt's EKG changes. He said it appears to be Afib w/RVR on the 12-lead EKG. Gave verbal orders w/readback to start Diltiazem at 10mg /hr.  Discussed pt's listed allergy to amlodipine. MD would like to proceed using Diltiazem.  Will continue to monitor closely.

## 2017-03-05 NOTE — Progress Notes (Signed)
Anesthesiology Follow-up:  Awake and alert, in good spirits, neuro intact, sitting in chair.  VS: T- 36.9 BP- 111/58 HR 73 (SR with occasional PVCs) RR- 11 O2 sat 94% on RA  K- 3.9 glucose- 140 BUN/CR.- 9/0.9 H/H- 12.7/40.2 Platelets- 76,13  83 year old male one day S/P TAVR, doing well, no apparent complications.  Justin [name]

## 2017-03-05 NOTE — Care Management Note (Addendum)
Case Management Note  Patient Details  Name: GUSTAVUS HASKIN MRN: [id number] Date of Birth: Sep 28, 1933  Subjective/Objective:  From home with wife, pta indep, pod 1 TAVR, he has PCP and medication coverage, and transportation , stopped plavix and started xarelto per Cards, NCM awaiting benefit check for xarelto,he will go to [facility] to get 30 days free and they do have in stock.  NCM gave patient 30 day savings card.               Action/Plan: NCM will follow for dc needs.   Expected Discharge Date:                  Expected Discharge Plan:  Home/Self Care  In-House Referral:     Discharge planning Services  CM Consult  Post Acute Care Choice:    Choice offered to:     DME Arranged:    DME Agency:     HH Arranged:    [place] Agency:     Status of Service:  Completed, signed off  If discussed at [employer] of Stay Meetings, dates discussed:    Additional Comments:  Zenon Mayo, RN 03/05/2017, 1:39 PM

## 2017-03-05 NOTE — Discharge Summary (Signed)
Discharge Summary    Patient ID: Justin [name],  MRN: [id number], DOB/AGE: May 29, 1934 83 y.o.  Admit date: 03/04/2017 Discharge date: 03/07/2017  Primary Care Provider: Laurey Morale Primary Cardiologist: Angelena Form   Discharge Diagnoses    Principal Problem:   S/P TAVR (transcatheter aortic valve replacement) Active Problems:   Essential hypertension   Coronary artery disease involving native coronary artery of native heart without angina pectoris   Aortic stenosis   Systolic heart failure (HCC)   S/P CABG x 4   Severe aortic valve stenosis   Allergies Allergies  Allergen Reactions  . Peanut-Containing Drug Products Anaphylaxis  . Sulfonamide Derivatives Anaphylaxis  . Amlodipine Swelling    Swelling in ankles  . Lisinopril Cough    cough    Diagnostic Studies/Procedures    TAVR: 03/04/17  Procedure:        Transcatheter Aortic Valve Replacement - Percutaneous Right Transfemoral Approach             Edwards Sapien 3 THV (size 29 mm, model # 9600TFX, serial # [id number])              Co-Surgeons:                        [name], MD and Valentina Gu. Roxy Manns, MD  Anesthesiologist:                  Roberts Gaudy, MD  Echocardiographer:              Jenkins Rouge, MD  Pre-operative Echo Findings: ? Severe aortic stenosis ? Moderate left ventricular systolic dysfunction  Post-operative Echo Findings: ? No paravalvular leak ? Unchanged left ventricular systolic function  TTE: 2/69/48  Study Conclusions  - Left ventricle: Abnormal septal motion inferior and posterior   lateral hypokinesis. The cavity size was mildly dilated. Wall   thickness was increased in a pattern of moderate LVH. Systolic   function was mildly to moderately reduced. The estimated ejection   fraction was in the range of 40% to 45%. - Aortic valve: Post implant 29 mm Sapien 3 valve with normal   leaflet motion and no peri valvular regurgitation. Valve area   (VTI): 1.48  cm^2. Valve area (Vmax): 1.35 cm^2. Valve area   (Vmean): 1.58 cm^2. - Mitral valve: Calcified annulus. - Left atrium: The atrium was moderately to severely dilated. - Atrial septum: No defect or patent foramen ovale was identified. _____________   History of Present Illness     83 year old male with history of coronary artery disease status post coronary artery bypass grafting in 2007, aortic stenosis, previous myocardial infarction with chronic combined systolic and diastolic congestive heart failure, hypertension, hypothyroidism, and prostate cancer with been referred for second surgical opinion to discuss treatment options for management of severe low gradient lowejection fraction aortic stenosis. The patient's cardiac history dates back approximately 18 years ago when he suffered an acute inferior wall myocardial infarction. He was treated medically and did well until 2007 when he presented with symptoms of angina pectoris. He was found to have severe left main and three-vessel coronary artery disease and underwent coronary artery bypass grafting 4 by Dr. Servando Snare. Grafts placed at the time of surgery included left internal mammary artery to the distal left anterior descending coronary artery, saphenous vein graft to diagonal branch, saphenous vein graft to the obtuse marginal branch of left circumflex coronary artery, and saphenous vein graft to the distal right  coronary artery. At the time of surgery the patient was noted to have sclerosis of the aortic valve with no significant aortic stenosis. The patient did well and has remained stable from a cardiac standpoint until fairly recently. Over the last 2-3 years the patient has developed exertional shortness of breath and chronic lower extremity edema. Symptoms gradually progressed and have been more problematic over the last 6 months. Transthoracic echocardiograms have documented the presence of aortic stenosis that up until recently has been  felt to be moderate in severity. In January of this year he was seen in follow-up by Dr. Angelena Form. Diagnostic cardiac catheterization was performed revealing severe native coronary artery disease with chronic occlusion of left main coronary artery and the right coronary artery. All of the previously placed bypass grafts remainedwidely patent and free of significant disease. Mean transvalvular gradient measured across the aortic valve was reported 17 mmHg. The patient was treated medically. Since then he has had further progression of symptoms of congestive heart failure, and in May he was hospitalized with acute exacerbation of chronic symptoms with resting shortness of breath and severe volume overload. He improved with diuretic therapy. Follow-up transthoracic echocardiogram performed at that time revealed the presence of aortic stenosis with peak velocity across the aortic valve measured a size 3.8 m/s corresponding to mean transvalvular gradient estimated 27 mmHg and aortic valve area calculated 0.57 cm. The DVI was reportedly quite low at 0.18 and left ventricular ejection fraction had notably dropped 40-45%, having previously been estimated 55-60% on the last previous echocardiogram performed in October 2017. Given symptoms he was referred for TAVR.   Hospital Course     Consultants: CVTS   Underwent successful TAVR with Dr. Angelena Form and Dr. Roxy Manns with Oletta Lamas Sapien 3 THV (size 68mm) on 03/04/17. Felt well post op without any noted complications. Plan for ASA/plavix. Post op labs showed Cr 0.80, Hgb 12.7. He was transferred to telemetry on 03/05/17. Worked well with cardiac rehab. Developed episode of Afib RVR and placed on IV amiodarone with conversion back to SR shortly afterwards. No further episodes noted. He was placed on Xarelto and Plavix stopped as a result. Plan for ASA/Xarelto. Follow up echo showed well seated valve with no peri valvular leak.   He was seen by Dr. Angelena Form and determined  stable for discharge home. Follow up in the office has been arranged. Medications are listed below.   _____________  Discharge Vitals Blood pressure 108/62, pulse 84, temperature 99.2 F (37.3 C), temperature source Oral, resp. rate 17, height 5\' 7"  (1.702 m), weight 265 lb 14.4 oz (120.6 kg), SpO2 98 %.  Filed Weights   03/04/17 1100 03/05/17 0500 03/07/17 0618  Weight: 264 lb (119.7 kg) 265 lb 4.8 oz (120.3 kg) 265 lb 14.4 oz (120.6 kg)    Labs & Radiologic Studies    CBC  Recent Labs  03/06/17 0546 03/07/17 0231  WBC 15.3* 14.4*  HGB 12.9* 12.4*  HCT 41.4 38.9*  MCV 86.1 84.7  PLT 161 300   Basic Metabolic Panel  Recent Labs  03/05/17 0350 03/06/17 0546 03/07/17 0231  NA 137 134* 134*  K 3.9 3.4* 3.7  CL 104 99* 101  CO2 27 27 26   GLUCOSE 109* 118* 109*  BUN 9 7 8   CREATININE 0.80 0.75 0.79  CALCIUM 8.1* 7.7* 7.9*  MG 2.0  --   --    Liver Function Tests No results for input(s): AST, ALT, ALKPHOS, BILITOT, PROT, ALBUMIN in the last 72 hours.  No results for input(s): LIPASE, AMYLASE in the last 72 hours. Cardiac Enzymes No results for input(s): CKTOTAL, CKMB, CKMBINDEX, TROPONINI in the last 72 hours. BNP Invalid input(s): POCBNP D-Dimer No results for input(s): DDIMER in the last 72 hours. Hemoglobin A1C No results for input(s): HGBA1C in the last 72 hours. Fasting Lipid Panel No results for input(s): CHOL, HDL, LDLCALC, TRIG, CHOLHDL, LDLDIRECT in the last 72 hours. Thyroid Function Tests No results for input(s): TSH, T4TOTAL, T3FREE, THYROIDAB in the last 72 hours.  Invalid input(s): FREET3 _____________  Dg Chest 2 View  Result Date: 02/27/2017 CLINICAL DATA:  Preoperative examination prior to aortic valve replacement. EXAM: CHEST  2 VIEW COMPARISON:  Chest x-ray of Jan 08, 2017 FINDINGS: The lungs are adequately inflated and clear. There is no pleural effusion. The cardiac silhouette is enlarged but stable. The pulmonary vascularity is less  prominent likely due to the PA technique. The patient has undergone previous CABG. There is calcification in the wall of the aortic arch. There is multilevel degenerative disc disease of the thoracic spine. IMPRESSION: Cardiomegaly without evidence of pulmonary vascular congestion or pulmonary edema. Previous CABG. No acute cardiopulmonary abnormality. Thoracic aortic atherosclerosis. Electronically Signed   By: David  Martinique M.D.   On: 02/27/2017 15:02   Dg Chest Port 1 View  Result Date: 03/05/2017 CLINICAL DATA:  Status post aortic valve repair. EXAM: PORTABLE CHEST 1 VIEW COMPARISON:  Radiograph March 04, 2017. FINDINGS: Stable cardiomegaly. Aortic valve prosthesis is noted. Right internal jugular catheter is unchanged in position. No pneumothorax or pleural effusion is noted. No acute pulmonary disease is noted. Bony thorax is unremarkable. IMPRESSION: No acute cardiopulmonary abnormality seen. Electronically Signed   By: Marijo Conception, M.D.   On: 03/05/2017 08:26   Dg Chest Port 1 View  Result Date: 03/04/2017 CLINICAL DATA:  Aortic stenosis. Status post transcatheter aortic valve replacement (TAVR). EXAM: PORTABLE CHEST 1 VIEW COMPARISON:  02/27/2017 FINDINGS: Central venous catheter tip appears in good position in the superior vena cava. No pneumothorax. Prosthetic aortic valve in place. CABG. Slight pulmonary vascular congestion, felt to be due to the shallow inspiration. No atelectasis or effusions. IMPRESSION: Mild vascular prominence felt to be due to a shallow inspiration. Electronically Signed   By: Lorriane Shire M.D.   On: 03/04/2017 11:01   Disposition   Pt is being discharged home today in good condition.  Follow-up Plans & Appointments    Follow-up Information    [name], PA-C Follow up on 03/19/2017.   Specialties:  Cardiology, Radiology Why:  at 11:30am for your follow up appt.  Contact information: [address] [place]  [phone]          Discharge Instructions    Amb Referral to Cardiac Rehabilitation    Complete by:  As directed    Diagnosis:  Valve Replacement   Valve:  Aortic Comment - TAVR   Call MD for:  redness, tenderness, or signs of infection (pain, swelling, redness, odor or green/yellow discharge around incision site)    Complete by:  As directed    Diet - low sodium heart healthy    Complete by:  As directed    Discharge instructions    Complete by:  As directed    ACTIVITY AND EXERCISE . Daily activity and exercise are an important part of your recovery. People recover at different rates depending on their general health and type of valve procedure. . Most people require six  to 10 weeks to feel recovered. . No lifting, pushing, pulling more than 10 pounds (examples to avoid: groceries, vacuuming, gardening, golfing):  - For one week with a procedure through the groin.  - For six weeks for procedures through the chest wall.  - For three months for procedures through the breast-bone. NOTE: You will typically see one of our providers 7-10 days after your procedure to discuss [place] the above activities.    DRIVING . Do not drive for four weeks after the date of your procedure. . If you have been told by your doctor in the past that you may not drive, you must talk with him/her before you begin driving again. . When you resume driving, you must have someone with you.   DRESSING . Groin site: you may leave the clear dressing over the site for up to one week or until it falls off.   HYGIENE . If you had a femoral (leg) procedure, you may take a shower when you return home. After the shower, pat the site dry. Do NOT use powder, oils or lotions in your groin area until the site has completely healed. . If you had a chest procedure, you may shower when you return home unless specifically instructed not to by your discharging practitioner.  - DO NOT scrub incision; pat  dry with a towel  - DO NOT apply any lotions, oils, powders to the incision  - No tub baths / swimming for at least six weeks. . If you notice any fevers, chills, increased pain, swelling, bleeding or pus, please contact your doctor.  ADDITIONAL INFORMATION . If you are going to have an upcoming dental procedure, please contact our office as you may require antibiotics ahead of time to prevent infection on your heart valve.  We started a new medication called Xarelto this admission. Please watch closely for signs of bleeding with this medication and notify the office with any concerns.   Increase activity slowly    Complete by:  As directed       Discharge Medications   Current Discharge Medication List    START taking these medications   Details  rivaroxaban (XARELTO) 20 MG TABS tablet Take 1 tablet (20 mg total) by mouth daily with breakfast. Qty: 30 tablet, Refills: 3      CONTINUE these medications which have CHANGED   Details  aspirin 81 MG tablet Take 1 tablet (81 mg total) by mouth daily. Qty: 30 tablet, Refills: 0      CONTINUE these medications which have NOT CHANGED   Details  atorvastatin (LIPITOR) 20 MG tablet Take 20 mg by mouth daily.    Azelastine-Fluticasone (DYMISTA) 137-50 MCG/ACT SUSP Place 2 puffs into the nose 2 (two) times daily. Qty: 1 Bottle, Refills: 6    benzonatate (TESSALON) 200 MG capsule Take 1 capsule (200 mg total) by mouth 2 (two) times daily as needed for cough. Qty: 60 capsule, Refills: 5    budesonide-formoterol (SYMBICORT) 160-4.5 MCG/ACT inhaler Inhale 2 puffs into the lungs 2 (two) times daily.    cetirizine (ZYRTEC) 10 MG tablet Take 10 mg by mouth daily.    chlorpheniramine (CHLOR-TRIMETON) 4 MG tablet Take 2 tablets (8 mg total) by mouth 3 (three) times daily. Qty: 90 tablet, Refills: 3    furosemide (LASIX) 80 MG tablet Take 1 tablet (80 mg total) by mouth daily. Qty: 30 tablet, Refills: 2    isosorbide mononitrate (IMDUR)  30 MG 24 hr tablet Take  1 tablet (30 mg total) by mouth daily. Qty: 90 tablet, Refills: 3    levothyroxine (SYNTHROID, LEVOTHROID) 75 MCG tablet Take 1 tablet (75 mcg total) by mouth daily. Qty: 90 tablet, Refills: 3    losartan (COZAAR) 25 MG tablet Take 1 tablet (25 mg total) by mouth daily. Qty: 30 tablet, Refills: 5    metoprolol (LOPRESSOR) 50 MG tablet Take 1 tablet (50 mg total) by mouth 2 (two) times daily. Qty: 180 tablet, Refills: 2    montelukast (SINGULAIR) 10 MG tablet TAKE 1 TABLET IN THE EVENING. Qty: 60 tablet, Refills: 5    Multiple Vitamins-Minerals (PRESERVISION AREDS PO) Take 2 tablets by mouth every morning.    potassium chloride SA (K-DUR,KLOR-CON) 20 MEQ tablet Take 2 tablets (40 mEq total) by mouth daily. Qty: 30 tablet, Refills: 2    ranitidine (ZANTAC) 150 MG capsule Take 150 mg by mouth 2 (two) times daily.          Outstanding Labs/Studies   Follow up echo in one month.   Duration of Discharge Encounter   Greater than 30 minutes including physician time.  Signed, Reino Bellis NP-C 03/07/2017, 11:26 AM   I have personally seen and examined this patient. I agree with the assessment and plan as outlined above.  See my full note the day of discharge.   [name] 03/11/2017 10:58 AM

## 2017-03-05 NOTE — Progress Notes (Signed)
Progress Note  Patient Name: Justin [name] Date of Encounter: 03/05/2017  Primary Cardiologist: Angelena Form  Subjective   No chest pain or dyspnea.   Inpatient Medications    Scheduled Meds: . acetaminophen  1,000 mg Oral Q6H   Or  . acetaminophen (TYLENOL) oral liquid 160 mg/5 mL  1,000 mg Per Tube Q6H  . acetaminophen (TYLENOL) oral liquid 160 mg/5 mL  650 mg Per Tube Once   Or  . acetaminophen  650 mg Rectal Once  . aspirin  81 mg Oral Daily  . atorvastatin  20 mg Oral q1800  . clopidogrel  75 mg Oral Q breakfast  . furosemide  80 mg Oral Daily  . isosorbide mononitrate  30 mg Oral Daily  . levothyroxine  75 mcg Oral QAC breakfast  . loratadine  10 mg Oral Daily  . losartan  25 mg Oral Daily  . mouth rinse  15 mL Mouth Rinse BID  . mometasone-formoterol  2 puff Inhalation BID  . montelukast  10 mg Oral QPM  . potassium chloride SA  40 mEq Oral Daily   Continuous Infusions: . albumin human    . cefUROXime (ZINACEF)  IV Stopped (03/04/17 1959)  . famotidine (PEPCID) IV    . lactated ringers     PRN Meds: albumin human, lactated ringers, midazolam, morphine injection, ondansetron (ZOFRAN) IV, oxyCODONE, traMADol   Vital Signs    Vitals:   03/05/17 0100 03/05/17 0200 03/05/17 0300 03/05/17 0400  BP: (!) 107/49 (!) 104/53 (!) 112/52 (!) 114/52  Pulse: 81 73 78 75  Resp: (!) 21 13 16 13   Temp:      TempSrc:      SpO2: 96% 97% 97% 99%  Weight:      Height:        Intake/Output Summary (Last 24 hours) at 03/05/17 0525 Last data filed at 03/05/17 0400  Gross per 24 hour  Intake           919.17 ml  Output              925 ml  Net            -5.83 ml   Filed Weights   03/04/17 0549 03/04/17 1100  Weight: 264 lb (119.7 kg) 264 lb (119.7 kg)    Telemetry    Sinus with PVCs - Personally Reviewed  ECG    Sinus, 1st degree AVB. IVCD - Personally Reviewed  Physical Exam   GEN: No acute distress.   Neck: No JVD Cardiac: RRR, no murmurs, rubs, or  gallops.  Respiratory: Clear to auscultation bilaterally. GI: Soft, nontender, non-distended  Ext: Trace bilateral LE edema; Both groins without hematoma Neuro:  Nonfocal  Psych: Normal affect   Labs    Chemistry Recent Labs Lab 02/27/17 1421 03/04/17 0758 03/04/17 0903 03/04/17 1057 03/05/17 0350  NA 137 140 140 141 137  K 3.9 3.9 3.8 3.9 3.9  CL 103 100* 101  --  104  CO2 23  --   --   --  27  GLUCOSE 99 136* 138* 119* 109*  BUN 12 13 11   --  9  CREATININE 0.75 0.70 0.50*  --  0.80  CALCIUM 8.5*  --   --   --  8.1*  PROT 6.9  --   --   --   --   ALBUMIN 3.2*  --   --   --   --   AST 21  --   --   --   --  ALT 20  --   --   --   --   ALKPHOS 77  --   --   --   --   BILITOT 1.0  --   --   --   --   GFRNONAA >60  --   --   --  >60  GFRAA >60  --   --   --  >60  ANIONGAP 11  --   --   --  6     Hematology Recent Labs Lab 02/27/17 1421  03/04/17 1042 03/04/17 1057 03/05/17 0350  WBC 11.3*  --  13.9*  --  11.3*  RBC 5.38  --  4.68  --  4.72  HGB 14.5  < > 12.8* 12.9* 12.7*  HCT 45.9  < > 39.6 38.0* 40.2  MCV 85.3  --  84.6  --  85.2  MCH 27.0  --  27.4  --  26.9  MCHC 31.6  --  32.3  --  31.6  RDW 16.5*  --  16.0*  --  16.1*  PLT 214  --  184  --  164  < > = values in this interval not displayed.    Radiology    Dg Chest Port 1 View  Result Date: 03/04/2017 CLINICAL DATA:  Aortic stenosis. Status post transcatheter aortic valve replacement (TAVR). EXAM: PORTABLE CHEST 1 VIEW COMPARISON:  02/27/2017 FINDINGS: Central venous catheter tip appears in good position in the superior vena cava. No pneumothorax. Prosthetic aortic valve in place. CABG. Slight pulmonary vascular congestion, felt to be due to the shallow inspiration. No atelectasis or effusions. IMPRESSION: Mild vascular prominence felt to be due to a shallow inspiration. Electronically Signed   By: Lorriane Shire M.D.   On: 03/04/2017 11:01    Cardiac Studies    Patient Profile     83 y.o. male  with chronic diastolic CHF, morbid obesity, CAD s/p CABG and severe aortic stenosis who underwent TAVR on 03/04/17 from the right transfemoral approach.   Assessment & Plan    1. Severe aortic valve stenosis: He is now one day post TAVR with placement of a 29 mm Edwards Sapien 3 bioprosthetic valve replacement from the right transfemoral approach. He is doing well this am. He is on ASA. Will start Plavix today. Echo pending today to assess valve.   2. CAD: No chest pain this am  Will d/c central line. Transfer to telemetry unit today  Signed, [name], MD  03/05/2017, 5:25 AM

## 2017-03-05 NOTE — Progress Notes (Signed)
  Echocardiogram 2D Echocardiogram has been performed.  Justin [name] [name] 03/05/2017, 9:14 AM

## 2017-03-05 NOTE — Progress Notes (Signed)
Pt has converted back to baseline rhythm (SR w/BBB and 1deg AVB). Still has frequent ectopy. Currently on 60mg /hr amiodarone. Will continue to monitor closely.

## 2017-03-06 ENCOUNTER — Ambulatory Visit: Payer: Medicare Other | Admitting: Cardiovascular Disease

## 2017-03-06 DIAGNOSIS — I4891 Unspecified atrial fibrillation: Secondary | ICD-10-CM

## 2017-03-06 DIAGNOSIS — I2510 Atherosclerotic heart disease of native coronary artery without angina pectoris: Secondary | ICD-10-CM

## 2017-03-06 LAB — CBC
HEMATOCRIT: 41.4 % (ref 39.0–52.0)
Hemoglobin: 12.9 g/dL — ABNORMAL LOW (ref 13.0–17.0)
MCH: 26.8 pg (ref 26.0–34.0)
MCHC: 31.2 g/dL (ref 30.0–36.0)
MCV: 86.1 fL (ref 78.0–100.0)
Platelets: 161 10*3/uL (ref 150–400)
RBC: 4.81 MIL/uL (ref 4.22–5.81)
RDW: 16.9 % — AB (ref 11.5–15.5)
WBC: 15.3 10*3/uL — ABNORMAL HIGH (ref 4.0–10.5)

## 2017-03-06 LAB — BASIC METABOLIC PANEL
ANION GAP: 8 (ref 5–15)
BUN: 7 mg/dL (ref 6–20)
CALCIUM: 7.7 mg/dL — AB (ref 8.9–10.3)
CHLORIDE: 99 mmol/L — AB (ref 101–111)
CO2: 27 mmol/L (ref 22–32)
CREATININE: 0.75 mg/dL (ref 0.61–1.24)
GFR calc Af Amer: >60 mL/min (ref >60)
GFR calc non Af Amer: >60 mL/min (ref >60)
GLUCOSE: 118 mg/dL — AB (ref 65–99)
Potassium: 3.4 mmol/L — ABNORMAL LOW (ref 3.5–5.1)
Sodium: 134 mmol/L — ABNORMAL LOW (ref 135–145)

## 2017-03-06 MED ORDER — METOPROLOL TARTRATE 50 MG PO TABS
50.0000 mg | ORAL_TABLET | Freq: Two times a day (BID) | ORAL | Status: DC
  Administered 2017-03-06 – 2017-03-07 (×3): 50 mg via ORAL
  Filled 2017-03-06 (×3): qty 1

## 2017-03-06 MED ORDER — RIVAROXABAN 20 MG PO TABS
20.0000 mg | ORAL_TABLET | Freq: Every day | ORAL | Status: DC
  Administered 2017-03-06 – 2017-03-07 (×2): 20 mg via ORAL
  Filled 2017-03-06 (×2): qty 1

## 2017-03-06 NOTE — Plan of Care (Signed)
Problem: Pain Management: Goal: Pain level will decrease Outcome: Progressing States no pain, declined scheduled Tylenol

## 2017-03-06 NOTE — Progress Notes (Signed)
Progress Note  Patient Name: Justin [name] Date of Encounter: 03/06/2017  Primary Cardiologist: Angelena Form  Subjective   No chest pain or SOB this am. Atrial fib overnight with RVR. Started on amiodarone drip and converted to sinus. He slept well.   Inpatient Medications    Scheduled Meds: . acetaminophen (TYLENOL) oral liquid 160 mg/5 mL  650 mg Per Tube Once  . acetaminophen  1,000 mg Oral Q6H  . aspirin  81 mg Oral Daily  . atorvastatin  20 mg Oral q1800  . furosemide  80 mg Oral Daily  . isosorbide mononitrate  30 mg Oral Daily  . levothyroxine  75 mcg Oral QAC breakfast  . loratadine  10 mg Oral Daily  . losartan  25 mg Oral Daily  . metoprolol tartrate  50 mg Oral BID  . mometasone-formoterol  2 puff Inhalation BID  . montelukast  10 mg Oral QPM  . potassium chloride SA  40 mEq Oral Daily  . rivaroxaban  20 mg Oral Daily   Continuous Infusions: . cefUROXime (ZINACEF)  IV Stopped (03/05/17 2224)   PRN Meds: LORazepam, morphine injection, ondansetron (ZOFRAN) IV, oxyCODONE, traMADol   Vital Signs    Vitals:   03/06/17 0200 03/06/17 0300 03/06/17 0400 03/06/17 0500  BP: (!) 100/52 (!) 145/66 106/60 121/65  Pulse: 82 85 82 79  Resp: (!) 22 (!) 21 (!) 21 (!) 9  Temp:      TempSrc:      SpO2: 98% 99% 98% 99%  Weight:      Height:        Intake/Output Summary (Last 24 hours) at 03/06/17 0703 Last data filed at 03/06/17 0529  Gross per 24 hour  Intake           554.58 ml  Output              650 ml  Net           -95.42 ml   Filed Weights   03/04/17 0549 03/04/17 1100 03/05/17 0500  Weight: 264 lb (119.7 kg) 264 lb (119.7 kg) 265 lb 4.8 oz (120.3 kg)    Telemetry    Sinus with PVCs this am. Atrial fib with RVR overnight- Personally Reviewed  ECG       Physical Exam   General: Well developed, well nourished, NAD  HEENT: OP clear, mucus membranes moist  SKIN: warm, dry. No rashes. Neuro: No focal deficits  Musculoskeletal: Muscle strength 5/5  all ext  Psychiatric: Mood and affect normal  Neck: No JVD, no carotid bruits, no thyromegaly, no lymphadenopathy.  Lungs:Clear bilaterally, no wheezes, rhonci, crackles Cardiovascular: Regular rate and rhythm. No murmurs, gallops or rubs. Abdomen:Soft. Bowel sounds present. Non-tender.  Extremities: No lower extremity edema. Pulses are 2 + in the bilateral DP/PT.     Labs    Chemistry  Recent Labs Lab 02/27/17 1421 03/04/17 0758 03/04/17 0903 03/04/17 1057 03/05/17 0350  NA 137 140 140 141 137  K 3.9 3.9 3.8 3.9 3.9  CL 103 100* 101  --  104  CO2 23  --   --   --  27  GLUCOSE 99 136* 138* 119* 109*  BUN 12 13 11   --  9  CREATININE 0.75 0.70 0.50*  --  0.80  CALCIUM 8.5*  --   --   --  8.1*  PROT 6.9  --   --   --   --   ALBUMIN 3.2*  --   --   --   --  AST 21  --   --   --   --   ALT 20  --   --   --   --   ALKPHOS 77  --   --   --   --   BILITOT 1.0  --   --   --   --   GFRNONAA >60  --   --   --  >60  GFRAA >60  --   --   --  >60  ANIONGAP 11  --   --   --  6     Hematology Recent Labs Lab 02/27/17 1421  03/04/17 1042 03/04/17 1057 03/05/17 0350  WBC 11.3*  --  13.9*  --  11.3*  RBC 5.38  --  4.68  --  4.72  HGB 14.5  < > 12.8* 12.9* 12.7*  HCT 45.9  < > 39.6 38.0* 40.2  MCV 85.3  --  84.6  --  85.2  MCH 27.0  --  27.4  --  26.9  MCHC 31.6  --  32.3  --  31.6  RDW 16.5*  --  16.0*  --  16.1*  PLT 214  --  184  --  164  < > = values in this interval not displayed.    Radiology    Dg Chest Port 1 View  Result Date: 03/05/2017 CLINICAL DATA:  Status post aortic valve repair. EXAM: PORTABLE CHEST 1 VIEW COMPARISON:  Radiograph March 04, 2017. FINDINGS: Stable cardiomegaly. Aortic valve prosthesis is noted. Right internal jugular catheter is unchanged in position. No pneumothorax or pleural effusion is noted. No acute pulmonary disease is noted. Bony thorax is unremarkable. IMPRESSION: No acute cardiopulmonary abnormality seen. Electronically Signed   By:  Marijo Conception, M.D.   On: 03/05/2017 08:26   Dg Chest Port 1 View  Result Date: 03/04/2017 CLINICAL DATA:  Aortic stenosis. Status post transcatheter aortic valve replacement (TAVR). EXAM: PORTABLE CHEST 1 VIEW COMPARISON:  02/27/2017 FINDINGS: Central venous catheter tip appears in good position in the superior vena cava. No pneumothorax. Prosthetic aortic valve in place. CABG. Slight pulmonary vascular congestion, felt to be due to the shallow inspiration. No atelectasis or effusions. IMPRESSION: Mild vascular prominence felt to be due to a shallow inspiration. Electronically Signed   By: Lorriane Shire M.D.   On: 03/04/2017 11:01    Cardiac Studies   Echo 03/05/17: Left ventricle: Abnormal septal motion inferior and posterior   lateral hypokinesis. The cavity size was mildly dilated. Wall   thickness was increased in a pattern of moderate LVH. Systolic   function was mildly to moderately reduced. The estimated ejection   fraction was in the range of 40% to 45%. - Aortic valve: Post implant 29 mm Sapien 3 valve with normal   leaflet motion and no peri valvular regurgitation. Valve area   (VTI): 1.48 cm^2. Valve area (Vmax): 1.35 cm^2. Valve area   (Vmean): 1.58 cm^2. - Mitral valve: Calcified annulus. - Left atrium: The atrium was moderately to severely dilated. - Atrial septum: No defect or patent foramen ovale was identified.  Patient Profile     83 y.o. male with chronic diastolic CHF, morbid obesity, CAD s/p CABG and severe aortic stenosis who underwent TAVR on 03/04/17 from the right transfemoral approach.   Assessment & Plan    1. Severe aortic valve stenosis:  He is now two days post TAVR with a 29 mm valve from the right transfemoral approach. Echo with normally  functioning bioprosthetic aortic valve. He has been started on ASA and Plavix post valve deployment but given new finding of atrial fib overnight, will stop Plavix and start Xarelto 20 mg daily. Continue ASA 81 mg  daily.  Will plan to continue ASA 81 mg daily and Xarelto 20 mg daily as an outpatient.   2. CAD: He has no chest pain this am. Restart beta blocker.   3. Atrial fib with RVR: new onset last night. Pt converted to sinus with IV amiodarone. CHADSVASC score of 5. Will stop IV amiodarone. Will start Xarelto 20 mg daily. Will resume home dose of metoprolol which was held post op due to bradycardia.   Transfer orders to tele written yesterday but no bed available. Will monitor x 24 more hours with atrial fib last night.     Signed, [name], MD  03/06/2017, 7:03 AM

## 2017-03-06 NOTE — Progress Notes (Signed)
1. XARELTO  20 MG  DAILY   COVER- YES  CO-PAY- $ 45.00 Q/L ONE PILL PER DAY  TIER- 3 DRUG  PRIOR APPROVAL-NO   PHARMACY: [facility]

## 2017-03-06 NOTE — Progress Notes (Signed)
[address]       [place] [address]             [phone]        CARDIOTHORACIC SURGERY PROGRESS NOTE   R2 Days Post-Op Procedure(s) (LRB): TRANSCATHETER AORTIC VALVE REPLACEMENT, TRANSFEMORAL (N/A) TRANSESOPHAGEAL ECHOCARDIOGRAM (TEE) (N/A)  Subjective: Episode Afib w/ RVR overnight.  Currently reports feeling quite well  Objective: Vital signs: BP Readings from Last 1 Encounters:  03/06/17 113/74   Pulse Readings from Last 1 Encounters:  03/06/17 86   Resp Readings from Last 1 Encounters:  03/06/17 19   Temp Readings from Last 1 Encounters:  03/06/17 98.3 F (36.8 C) (Oral)    Hemodynamics:    Physical Exam:  Rhythm:   Sinus w/ PVC's and PAC's  Breath sounds: clear  Heart sounds:  RRR  Incisions:  Both groins okay  Abdomen:  Soft, non-distended, non-tender  Extremities:  Warm, well-perfused    Intake/Output from previous day: 07/11 0701 - 07/12 0700 In: 579.9 [P.O.:240; I.V.:336.9] Out: 650 [Urine:650] Intake/Output this shift: No intake/output data recorded.  Lab Results:  CBC: Recent Labs  03/05/17 0350 03/06/17 0546  WBC 11.3* 15.3*  HGB 12.7* 12.9*  HCT 40.2 41.4  PLT 164 161    BMET:  Recent Labs  03/05/17 0350 03/06/17 0546  NA 137 134*  K 3.9 3.4*  CL 104 99*  CO2 27 27  GLUCOSE 109* 118*  BUN 9 7  CREATININE 0.80 0.75  CALCIUM 8.1* 7.7*     PT/INR:   Recent Labs  03/04/17 1042  LABPROT 15.6*  INR 1.23    CBG (last 3)  No results for input(s): GLUCAP in the last 72 hours.  ABG    Component Value Date/Time   PHART 7.475 (H) 02/27/2017 1422   PCO2ART 36.4 02/27/2017 1422   PO2ART 69.5 (L) 02/27/2017 1422   HCO3 26.5 02/27/2017 1422   TCO2 28 03/04/2017 0903   O2SAT 95.0 02/27/2017 1422    Transthoracic Echocardiography  Patient:    Justin [name], Justin [name] MR #:       [id number] Study Date: 03/05/2017 Gender:     M Age:        83 Height:     170.2 cm Weight:     120.3 kg BSA:         2.44 m^2 Pt. Status: Room:       [facility]   ATTENDING    Darlina Guys, MD  ADMITTING    Sonny Dandy, [place], [place], Inpatient  SONOGRAPHER  Cardell Peach, RDCS  cc:  ------------------------------------------------------------------- LV EF: 40% -   45%  ------------------------------------------------------------------- History:   PMH:  Aortic Valve disorder.  Murmur.  Coronary artery disease. Congestive heart failure.  Risk factors:  Hypertension. Dyslipidemia.  ------------------------------------------------------------------- Study Conclusions  - Left ventricle: Abnormal septal motion inferior and posterior   lateral hypokinesis. The cavity size was mildly dilated. Wall   thickness was increased in a pattern of moderate LVH. Systolic   function was mildly to moderately reduced. The estimated ejection   fraction was in the range of 40% to 45%. - Aortic valve: Post implant 29 mm Sapien 3 valve with normal   leaflet motion and no peri valvular regurgitation. Valve area   (VTI): 1.48 cm^2. Valve area (Vmax): 1.35 cm^2. Valve area   (Vmean): 1.58 cm^2. - Mitral valve:  Calcified annulus. - Left atrium: The atrium was moderately to severely dilated. - Atrial septum: No defect or patent foramen ovale was identified.  ------------------------------------------------------------------- Study data:  Comparison was made to the study of 01/09/2017.  Study status:  Routine.  Procedure:  Transthoracic echocardiography. Image quality was adequate.  Study completion:  There were no complications.          Transthoracic echocardiography.  M-mode, complete 2D, spectral Doppler, and color Doppler.  Birthdate: Patient birthdate: [birth year].  Age:  Patient is 83 yr old.  Sex: Gender: male.    BMI: 41.5 kg/m^2.  Blood pressure:     122/59 Patient status:  Inpatient.  Study date:  Study date:  03/05/2017. Study time: 07:59 AM.  Location:  Bedside.  -------------------------------------------------------------------  ------------------------------------------------------------------- Left ventricle:  Abnormal septal motion inferior and posterior lateral hypokinesis. The cavity size was mildly dilated. Wall thickness was increased in a pattern of moderate LVH. Systolic function was mildly to moderately reduced. The estimated ejection fraction was in the range of 40% to 45%.  ------------------------------------------------------------------- Aortic valve:  Post implant 29 mm Sapien 3 valve with normal leaflet motion and no peri valvular regurgitation.  Doppler: VTI ratio of LVOT to aortic valve: 0.44. Valve area (VTI): 1.48 cm^2. Indexed valve area (VTI): 0.61 cm^2/m^2. Peak velocity ratio of LVOT to aortic valve: 0.41. Valve area (Vmax): 1.35 cm^2. Indexed valve area (Vmax): 0.55 cm^2/m^2. Mean velocity ratio of LVOT to aortic valve: 0.46. Valve area (Vmean): 1.58 cm^2. Indexed valve area (Vmean): 0.65 cm^2/m^2.    Mean gradient (S): 11 mm Hg. Peak gradient (S): 24 mm Hg.  ------------------------------------------------------------------- Aorta:  The aorta was normal, not dilated, and non-diseased.  ------------------------------------------------------------------- Mitral valve:   Calcified annulus.  Doppler:  There was trivial regurgitation.  ------------------------------------------------------------------- Left atrium:  The atrium was moderately to severely dilated.  ------------------------------------------------------------------- Atrial septum:  No defect or patent foramen ovale was identified.   ------------------------------------------------------------------- Right ventricle:  The cavity size was normal. Wall thickness was normal. Systolic function was normal.  ------------------------------------------------------------------- Pulmonic valve:     Doppler:  There was mild regurgitation.  ------------------------------------------------------------------- Tricuspid valve:   Doppler:  There was mild regurgitation.  ------------------------------------------------------------------- Right atrium:  The atrium was normal in size.  ------------------------------------------------------------------- Pericardium:  The pericardium was normal in appearance.  ------------------------------------------------------------------- Systemic veins: Inferior vena cava: The vessel was normal in size. The respirophasic diameter changes were in the normal range (>= 50%), consistent with normal central venous pressure.  ------------------------------------------------------------------- Post procedure conclusions Ascending Aorta:  - The aorta was normal, not dilated, and non-diseased.  ------------------------------------------------------------------- Measurements   Left ventricle                           Value          Reference  LV ID, ED, PLAX chordal                  45    mm       43 - 52  LV ID, ES, PLAX chordal                  31    mm       23 - 38  LV fx shortening, PLAX chordal           31    %        >=29  LV PW thickness, ED  15    mm       ----------  IVS/LV PW ratio, ED                      1.07           <=1.3  Stroke volume, 2D                        72    ml       ----------  Stroke volume/bsa, 2D                    29    ml/m^2   ----------    Ventricular septum                       Value          Reference  IVS thickness, ED                        16    mm       ----------    LVOT                                     Value          Reference  LVOT ID, S                               21    mm       ----------  LVOT area                                3.46  cm^2     ----------  LVOT peak velocity, S                    101   cm/s     ----------  LVOT mean velocity, S                    71.4   cm/s     ----------  LVOT VTI, S                              20.9  cm       ----------  LVOT peak gradient, S                    4     mm Hg    ----------    Aortic valve                             Value          Reference  Aortic valve peak velocity, S            244   cm/s     ----------  Aortic valve mean velocity, S            156   cm/s     ----------  Aortic valve VTI, S  47.3  cm       ----------  Aortic mean gradient, S                  11    mm Hg    ----------  Aortic peak gradient, S                  24    mm Hg    ----------  VTI ratio, LVOT/AV                       0.44           ----------  Aortic valve area, VTI                   1.48  cm^2     ----------  Aortic valve area/bsa, VTI               0.61  cm^2/m^2 ----------  Velocity ratio, peak, LVOT/AV            0.41           ----------  Aortic valve area, peak velocity         1.35  cm^2     ----------  Aortic valve area/bsa, peak              0.55  cm^2/m^2 ----------  velocity  Velocity ratio, mean, LVOT/AV            0.46           ----------  Aortic valve area, mean velocity         1.58  cm^2     ----------  Aortic valve area/bsa, mean              0.65  cm^2/m^2 ----------  velocity    Aorta                                    Value          Reference  Aortic root ID, ED                       27    mm       ----------    Left atrium                              Value          Reference  LA ID, A-P, ES                           55    mm       ----------  LA ID/bsa, A-P                   (H)     2.25  cm/m^2   <=2.2  LA volume, ES, 1-p A4C                   60.9  ml       ----------  LA volume/bsa, ES, 1-p A4C               24.9  ml/m^2   ----------  LA volume, ES, 1-p A2C  66.2  ml       ----------  LA volume/bsa, ES, 1-p A2C               27.1  ml/m^2   ----------    Right atrium                             Value          Reference  RA ID, S-I, ES, A4C              (H)      67.7  mm       34 - 49  RA area, ES, A4C                         15.8  cm^2     8.3 - 19.5  RA volume, ES, A/L                       30.6  ml       ----------  RA volume/bsa, ES, A/L                   12.5  ml/m^2   ----------    Systemic veins                           Value          Reference  Estimated CVP                            3     mm Hg    ----------    Right ventricle                          Value          Reference  RV ID, minor axis, ED, A4C base          40    mm       ----------  TAPSE                                    11.2  mm       ----------  RV s&', lateral, S                        5.66  cm/s     ----------    Pulmonic valve                           Value          Reference  Pulmonic regurg velocity, ED             114   cm/s     ----------  Pulmonic regurg gradient, ED             5     mm Hg    ----------  Legend: (L)  and  (H)  mark values outside specified reference range.  ------------------------------------------------------------------- Prepared and Electronically Authenticated by  Jenkins Rouge, M.D. 2018-07-11T09:37:12  Assessment/Plan: S/P Procedure(s) (LRB): TRANSCATHETER AORTIC VALVE REPLACEMENT, TRANSFEMORAL (N/A) TRANSESOPHAGEAL ECHOCARDIOGRAM (TEE) (N/A)  Overall doing well POD2  s/p TAVR but episode Afib w/ RVR overnight Agree w/ plans outlined by Dr Angelena Form Restart beta blocker Stop Plavix and start [place] on telemetry at least 1 more day  Rexene Alberts, MD 03/06/2017 8:42 AM

## 2017-03-07 LAB — BASIC METABOLIC PANEL
ANION GAP: 7 (ref 5–15)
BUN: 8 mg/dL (ref 6–20)
CHLORIDE: 101 mmol/L (ref 101–111)
CO2: 26 mmol/L (ref 22–32)
Calcium: 7.9 mg/dL — ABNORMAL LOW (ref 8.9–10.3)
Creatinine, Ser: 0.79 mg/dL (ref 0.61–1.24)
GFR calc non Af Amer: >60 mL/min (ref >60)
Glucose, Bld: 109 mg/dL — ABNORMAL HIGH (ref 65–99)
POTASSIUM: 3.7 mmol/L (ref 3.5–5.1)
Sodium: 134 mmol/L — ABNORMAL LOW (ref 135–145)

## 2017-03-07 LAB — CBC
HEMATOCRIT: 38.9 % — AB (ref 39.0–52.0)
HEMOGLOBIN: 12.4 g/dL — AB (ref 13.0–17.0)
MCH: 27 pg (ref 26.0–34.0)
MCHC: 31.9 g/dL (ref 30.0–36.0)
MCV: 84.7 fL (ref 78.0–100.0)
Platelets: 150 10*3/uL (ref 150–400)
RBC: 4.59 MIL/uL (ref 4.22–5.81)
RDW: 16.6 % — AB (ref 11.5–15.5)
WBC: 14.4 10*3/uL — AB (ref 4.0–10.5)

## 2017-03-07 MED ORDER — ASPIRIN 81 MG PO TABS
81.0000 mg | ORAL_TABLET | Freq: Every day | ORAL | 0 refills | Status: DC
Start: 2017-03-07 — End: 2019-05-19

## 2017-03-07 MED ORDER — RIVAROXABAN 20 MG PO TABS: 20.0000 mg | ORAL_TABLET | Freq: Every day | ORAL | 3 refills | Status: DC

## 2017-03-07 NOTE — Progress Notes (Signed)
      [address]       [place] [address]             [phone]      3 Days Post-Op Procedure(s) (LRB): TRANSCATHETER AORTIC VALVE REPLACEMENT, TRANSFEMORAL (N/A) TRANSESOPHAGEAL ECHOCARDIOGRAM (TEE) (N/A) Subjective: Feels ok, no specific c/o  Objective: Vital signs in last 24 hours: Temp:  [98.2 F (36.8 C)-99.2 F (37.3 C)] 99.2 F (37.3 C) (07/12 2011) Pulse Rate:  [36-101] 64 (07/13 0618) Cardiac Rhythm: Heart block;Bundle branch block (07/12 2328) Resp:  [15-27] 18 (07/13 0618) BP: (89-115)/(51-74) 113/67 (07/13 0027) SpO2:  [92 %-100 %] 94 % (07/13 0618) Weight:  [265 lb 14.4 oz (120.6 kg)] 265 lb 14.4 oz (120.6 kg) (07/13 0618)  Hemodynamic parameters for last 24 hours:    Intake/Output from previous day: 07/12 0701 - 07/13 0700 In: 376.7 [P.O.:360; I.V.:16.7] Out: -  Intake/Output this shift: No intake/output data recorded.  General appearance: alert, cooperative and no distress Heart: regular rate and rhythm and soft systolic aortic murmur Lungs: clear to auscultation bilaterally Abdomen: benign Extremities: min edema Wound: some echymosis on genitalia/groin- no hematoma  Lab Results:  Recent Labs  03/06/17 0546 03/07/17 0231  WBC 15.3* 14.4*  HGB 12.9* 12.4*  HCT 41.4 38.9*  PLT 161 150   BMET:  Recent Labs  03/06/17 0546 03/07/17 0231  NA 134* 134*  K 3.4* 3.7  CL 99* 101  CO2 27 26  GLUCOSE 118* 109*  BUN 7 8  CREATININE 0.75 0.79  CALCIUM 7.7* 7.9*    PT/INR:  Recent Labs  03/04/17 1042  LABPROT 15.6*  INR 1.23   ABG    Component Value Date/Time   PHART 7.475 (H) 02/27/2017 1422   HCO3 26.5 02/27/2017 1422   TCO2 28 03/04/2017 0903   O2SAT 95.0 02/27/2017 1422   CBG (last 3)  No results for input(s): GLUCAP in the last 72 hours.  Meds Scheduled Meds: . acetaminophen (TYLENOL) oral liquid 160 mg/5 mL  650 mg Per Tube Once  . acetaminophen  1,000 mg Oral Q6H  . aspirin  81 mg Oral Daily  .  atorvastatin  20 mg Oral q1800  . furosemide  80 mg Oral Daily  . isosorbide mononitrate  30 mg Oral Daily  . levothyroxine  75 mcg Oral QAC breakfast  . loratadine  10 mg Oral Daily  . losartan  25 mg Oral Daily  . metoprolol tartrate  50 mg Oral BID  . mometasone-formoterol  2 puff Inhalation BID  . montelukast  10 mg Oral QPM  . potassium chloride SA  40 mEq Oral Daily  . rivaroxaban  20 mg Oral Q breakfast   Continuous Infusions: PRN Meds:.LORazepam, morphine injection, ondansetron (ZOFRAN) IV, oxyCODONE, traMADol  Xrays No results found.  Assessment/Plan: S/P Procedure(s) (LRB): TRANSCATHETER AORTIC VALVE REPLACEMENT, TRANSFEMORAL (N/A) TRANSESOPHAGEAL ECHOCARDIOGRAM (TEE) (N/A)  1 conts to progress nicely 2 some junct tachy, PVC's , no further afib. Tm 99.2, BP is adeq controlled, K= 3.7 will replace. H/H a little lower 3 rehab improving 4 pulm status stable 5 poss d/c today if all agree  LOS: 3 days    Justin [name],Justin [name] 03/07/2017

## 2017-03-07 NOTE — Care Management Important Message (Signed)
Important Message  Patient Details  Name: Justin [name] MRN: [id number] Date of Birth: 1934/08/19   Medicare Important Message Given:  Yes    [name] Montine Circle 03/07/2017, 1:33 PM

## 2017-03-07 NOTE — Progress Notes (Addendum)
Progress Note  Patient Name: Justin [name] Date of Encounter: 03/07/2017  Primary Cardiologist: Angelena Form  Subjective   No chest pain or dyspnea.   Inpatient Medications    Scheduled Meds: . acetaminophen (TYLENOL) oral liquid 160 mg/5 mL  650 mg Per Tube Once  . acetaminophen  1,000 mg Oral Q6H  . aspirin  81 mg Oral Daily  . atorvastatin  20 mg Oral q1800  . furosemide  80 mg Oral Daily  . isosorbide mononitrate  30 mg Oral Daily  . levothyroxine  75 mcg Oral QAC breakfast  . loratadine  10 mg Oral Daily  . losartan  25 mg Oral Daily  . metoprolol tartrate  50 mg Oral BID  . mometasone-formoterol  2 puff Inhalation BID  . montelukast  10 mg Oral QPM  . potassium chloride SA  40 mEq Oral Daily  . rivaroxaban  20 mg Oral Q breakfast   Continuous Infusions:  PRN Meds: LORazepam, morphine injection, ondansetron (ZOFRAN) IV, oxyCODONE, traMADol   Vital Signs    Vitals:   03/06/17 1856 03/06/17 2011 03/07/17 0027 03/07/17 0618  BP: (!) 89/69 95/69 113/67   Pulse: (!) 101 (!) 101 (!) 58 64  Resp: (!) 22 16 (!) 24 18  Temp: 98.7 F (37.1 C) 99.2 F (37.3 C)    TempSrc: Oral Oral    SpO2: 100% 95% 100% 94%  Weight:    265 lb 14.4 oz (120.6 kg)  Height:        Intake/Output Summary (Last 24 hours) at 03/07/17 0631 Last data filed at 03/06/17 1800  Gross per 24 hour  Intake           402.03 ml  Output                0 ml  Net           402.03 ml   Filed Weights   03/04/17 1100 03/05/17 0500 03/07/17 0618  Weight: 264 lb (119.7 kg) 265 lb 4.8 oz (120.3 kg) 265 lb 14.4 oz (120.6 kg)    Telemetry    Sinus, IVCD- Personally Reviewed  ECG       Physical Exam   General: Well developed, well nourished, NAD  HEENT: OP clear, mucus membranes moist  SKIN: warm, dry. No rashes. Neuro: No focal deficits  Musculoskeletal: Muscle strength 5/5 all ext  Psychiatric: Mood and affect normal  Neck: No JVD, no carotid bruits, no thyromegaly, no lymphadenopathy.    Lungs:Clear bilaterally, no wheezes, rhonci, crackles Cardiovascular: Regular rate and rhythm. No murmurs, gallops or rubs. Abdomen:Soft. Bowel sounds present. Non-tender.  Extremities: No lower extremity edema. Pulses are 2 + in the bilateral DP/PT. Bilateral groins without hematoma    Labs    Chemistry  Recent Labs Lab 03/05/17 0350 03/06/17 0546 03/07/17 0231  NA 137 134* 134*  K 3.9 3.4* 3.7  CL 104 99* 101  CO2 27 27 26   GLUCOSE 109* 118* 109*  BUN 9 7 8   CREATININE 0.80 0.75 0.79  CALCIUM 8.1* 7.7* 7.9*  GFRNONAA >60 >60 >60  GFRAA >60 >60 >60  ANIONGAP 6 8 7      Hematology  Recent Labs Lab 03/05/17 0350 03/06/17 0546 03/07/17 0231  WBC 11.3* 15.3* 14.4*  RBC 4.72 4.81 4.59  HGB 12.7* 12.9* 12.4*  HCT 40.2 41.4 38.9*  MCV 85.2 86.1 84.7  MCH 26.9 26.8 27.0  MCHC 31.6 31.2 31.9  RDW 16.1* 16.9* 16.6*  PLT 164 161  150      Radiology    No results found.  Cardiac Studies   Echo 03/05/17: Left ventricle: Abnormal septal motion inferior and posterior   lateral hypokinesis. The cavity size was mildly dilated. Wall   thickness was increased in a pattern of moderate LVH. Systolic   function was mildly to moderately reduced. The estimated ejection   fraction was in the range of 40% to 45%. - Aortic valve: Post implant 29 mm Sapien 3 valve with normal   leaflet motion and no peri valvular regurgitation. Valve area   (VTI): 1.48 cm^2. Valve area (Vmax): 1.35 cm^2. Valve area   (Vmean): 1.58 cm^2. - Mitral valve: Calcified annulus. - Left atrium: The atrium was moderately to severely dilated. - Atrial septum: No defect or patent foramen ovale was identified.  Patient Profile     83 y.o. male with chronic diastolic CHF, morbid obesity, CAD s/p CABG and severe aortic stenosis who underwent TAVR on 03/04/17 from the right transfemoral approach.   Assessment & Plan    1. Severe aortic valve stenosis:  He is now three days post TAVR with 29 mm  bioprosthetic AVR from the right transfemoral approach. Echo with normally functioning AVR. He is now on ASA and Xarelto. The Xarelto was started 03/06/17 after episode of atrial fib which was new for him. Will continue ASA and Xarelto at discharge    2. CAD: No chest pain  3. Atrial fib with RVR: New onset evening of 03/05/17. Converted to sinus with IV amiodarone. He is now off of amiodarone and back on his beta blocker at home dose. CHADSVASC score 5. Will continue metoprolol and Xarelto.    Discharge home today. Follow up one week with office APP and one month with me with echo on day of f/u with me in one month.   Signed, [name], MD  03/07/2017, 6:31 AM

## 2017-03-07 NOTE — Progress Notes (Signed)
CARDIAC REHAB PHASE I   PRE:  Rate/Rhythm: ?JT  BP:  Supine:   Sitting: 95/56  Standing:    SaO2: 97%RA  MODE:  Ambulation: 470 ft   POST:  Rate/Rhythm: 93 ?JT  BP:  Supine:   Sitting: 138/56  Standing:    SaO2: 96%RA 0940-1020 Pt walked 470 ft independently with steady gait. Tolerated well. Pt knows to weigh daily and when to call MD with weight gain. Gave low sodium handouts and reinforced the 2000 mg sodium restriction. Gave ex ed and will refer to [facility] Phase 2. Pt attended before and excited to do again.   Graylon Good, RN BSN  03/07/2017 10:15 AM

## 2017-03-07 NOTE — Discharge Instructions (Signed)

## 2017-03-10 ENCOUNTER — Encounter: Payer: Self-pay | Admitting: Family Medicine

## 2017-03-10 ENCOUNTER — Ambulatory Visit (INDEPENDENT_AMBULATORY_CARE_PROVIDER_SITE_OTHER): Payer: Medicare Other | Admitting: Family Medicine

## 2017-03-10 VITALS — BP 129/71 | HR 78 | Temp 97.9°F | Ht 67.0 in | Wt 265.0 lb

## 2017-03-10 DIAGNOSIS — I350 Nonrheumatic aortic (valve) stenosis: Secondary | ICD-10-CM

## 2017-03-10 DIAGNOSIS — Z951 Presence of aortocoronary bypass graft: Secondary | ICD-10-CM | POA: Diagnosis not present

## 2017-03-10 DIAGNOSIS — I10 Essential (primary) hypertension: Secondary | ICD-10-CM | POA: Diagnosis not present

## 2017-03-10 DIAGNOSIS — Z952 Presence of prosthetic heart valve: Secondary | ICD-10-CM

## 2017-03-10 DIAGNOSIS — I5022 Chronic systolic (congestive) heart failure: Secondary | ICD-10-CM

## 2017-03-10 NOTE — Patient Instructions (Signed)
WE NOW OFFER    [facility] FAST TRACK!!!  SAME DAY Appointments for ACUTE CARE  Such as: Sprains, Injuries, cuts, abrasions, rashes, muscle pain, joint pain, back pain Colds, flu, sore throats, headache, allergies, cough, fever  Ear pain, sinus and eye infections Abdominal pain, nausea, vomiting, diarrhea, upset stomach Animal/insect bites  3 Easy Ways to Schedule: Walk-In Scheduling Call in scheduling Mychart Sign-up: [email or web address]         

## 2017-03-10 NOTE — Progress Notes (Signed)
   Subjective:    Patient ID: Justin [name], male    DOB: 09/04/1933, 83 y.o.   MRN: [id number]  HPI Here with his wife to follow up a hospital stay from 03-04-17 to 03-07-17 for a transcatheter AVR per Dr. Angelena Form and Dr. Roxy Manns. This went very well and he feels great. His vital signs were stable and his cardiac output was preserved. The catheter site looks clean although he still has a tiny amount of pinkish drainage daily. He had significant scrotal swelling but this has receded. He does still complain of a a dry tickling cough in the throat, which we have discussed for some time. He was switched from Lisinopril to Losartan several years ago.    Review of Systems  Constitutional: Negative.   Respiratory: Positive for cough. Negative for chest tightness, shortness of breath and wheezing.   Cardiovascular: Negative.   Gastrointestinal: Negative.   Neurological: Negative.        Objective:   Physical Exam  Constitutional: He appears well-developed and well-nourished.  Cardiovascular: Normal rate, regular rhythm and intact distal pulses.   He has a crisp click and a 1/6 SM as expected. Occasional ectopy.   Pulmonary/Chest: Effort normal and breath sounds normal. No respiratory distress. He has no wheezes. He has no rales.  Abdominal: Soft. Bowel sounds are normal. He exhibits no distension and no mass. There is no tenderness. There is no rebound and no guarding.  The right groin site is clean. Not tender.   Genitourinary:  Genitourinary Comments: Mild ecchymosis in the scrotum           Assessment & Plan:  He is doing well after a TAVR. His HTN and CHF are stable. He will see [name] on 03-18-17 and then the Cardiology clinic on 03-19-17. He will soon begin the cardiac rehab program. I told him there is a slight possibility that his cough is coming from his ARB, so he will ask Cardiology of this can be stopped.  [name], MD

## 2017-03-11 ENCOUNTER — Telehealth (HOSPITAL_COMMUNITY): Payer: Self-pay

## 2017-03-11 ENCOUNTER — Telehealth: Payer: Self-pay | Admitting: Cardiovascular Disease

## 2017-03-11 NOTE — Telephone Encounter (Signed)
Can we let him know that the medication can be discussed at his office visit next week? There is only a small chance that the ARB is causing his cough. Justin [name]

## 2017-03-11 NOTE — Telephone Encounter (Signed)
Pt aware Dr Angelena Form on vacation this week may call back this week with response or may not pending if messages are reviewed .Per pt has appt with  Melina Copa next week can make changes at that time pt aware./cy

## 2017-03-11 NOTE — Telephone Encounter (Signed)
Pt notified ./cy 

## 2017-03-11 NOTE — Telephone Encounter (Signed)
New message      Pt want the doctor/nurse to look at Dr Barbie Banner note regarding stopping a medication because pt has a cough.  Please call and let him know if Dr Angelena Form agrees

## 2017-03-11 NOTE — Telephone Encounter (Signed)
Patient insurance is active and benefits verified. Patient has [facility] Medicare -  $20.00 co-pay, no deductible, out of pocket $4400/$1922.74 has been met, no co-insurance, no pre-authorization and no limit on visit. Passport/reference [id number].  Patient will be contacted and scheduled after their follow up appointment with the cardiologist on 03/19/17, upon review by [facility] RN navigator.

## 2017-03-18 ENCOUNTER — Encounter: Payer: Self-pay | Admitting: Pulmonary Disease

## 2017-03-18 ENCOUNTER — Ambulatory Visit (INDEPENDENT_AMBULATORY_CARE_PROVIDER_SITE_OTHER): Payer: Medicare Other | Admitting: Pulmonary Disease

## 2017-03-18 VITALS — BP 118/64 | HR 78 | Ht 67.0 in | Wt 264.0 lb

## 2017-03-18 DIAGNOSIS — J4520 Mild intermittent asthma, uncomplicated: Secondary | ICD-10-CM

## 2017-03-18 DIAGNOSIS — K219 Gastro-esophageal reflux disease without esophagitis: Secondary | ICD-10-CM | POA: Diagnosis not present

## 2017-03-18 DIAGNOSIS — J309 Allergic rhinitis, unspecified: Secondary | ICD-10-CM

## 2017-03-18 DIAGNOSIS — R05 Cough: Secondary | ICD-10-CM

## 2017-03-18 DIAGNOSIS — R059 Cough, unspecified: Secondary | ICD-10-CM

## 2017-03-18 NOTE — Patient Instructions (Signed)
Allergic rhinitis  continue Dymista, montelukast, and Chlor-Trimeton as you are doing  GERD:  continue taking Zantac at night but stop taking the morning dose Start taking Prilosec in the morning, 20 mg daily, make sure your primary care physician and pharmacist know you are taking this  Asthma: Continue taking Dulera until you run out, then resume Symbicort  We will see you back in 6-8 weeks, if the cough has not improved by that point we will perform an esophageal pH probe and manometry monitoring

## 2017-03-18 NOTE — Progress Notes (Signed)
Cardiology Office Note    Date:  03/19/2017  ID:  Justin, [name] [birth year], MRN [id number] PCP:  Laurey Morale, MD  Cardiologist:  Dr. Angelena Form   Chief Complaint: f/u TAVR  History of Present Illness:  Justin [name] is a 83 y.o. male with history of CAD s/p CABG in 2007, aortic stenosis s/p recent TAVR 02/2017 (post-op atrial fib), previous myocardial infarction with chronic combined systolic and diastolic congestive heart failure, hypertension, hypothyroidism, and prostate cancer who presents for post-TAVR follow-up.  He was recently seen by the multidisciplinary valve team with complaints of progressive shortness of breath. Last cath 08/2016 showed severe native CAD with 4/4 patent bypass grafts and moderate AS, with recommendation to continue medical therapy. 2D Echo 01/09/17 showed moderate LVH, EF 40-45% (previously 55-60%), grade 2 DD, moderate-severe AS, mild AI, mild LAE, mildly dilated RV with mod reduced RV function. Given sx, he was referred for TAVR. He was admitted for this procedure on 03/04/17 by Dr. Angelena Form and Dr. Roxy Manns with Oletta Lamas Sapien 3 THV (size 27mm) on 03/04/17. Plan for ASA/plavix. Post op labs showed Cr 0.80, Hgb 12.7. He did develop post-op atrial fib RVR and was placed on IV amiodarone with conversion back to NSR. He was placed on Xarelto and Plavix stopped as a result, discharged on ASA/Xarelto. New intraventricular conduction delay was also noted post-surgery. Follow up echo 03/05/17 showed EF 40-45%, well-seated valve with no perivalvular leak, mod-severe dilated LA. Last labs showed Hgb 12.4, Plt 150, K 3.7, Na 134, Cr 0.79.  He presents back for post-op follow-up feeling "excellent." No chest pain, SOB, fatigue, syncope, LEE. Weight stable at home. Remains "on the go." He has not felt any recurrent palpitations like the atrial fib he had in the hospital. He is unaware of his irregular heartbeat in the office today (see below).   Past Medical History:    Diagnosis Date  . Age-related macular degeneration, dry, both eyes   . Allergic    "24/7; 365 days/year; I'm allergic to pollens, dust, all southern grasses/trees, mold, mildue, cats, dogs" (01/07/2017)  . Asthma    sees [name]   . Benign prostatic hypertrophy    (sees Dr. Denman George  . CAD (coronary artery disease)    a. s/p CABG 2007. b. Cath 08/2016 - 4/4 patent grafts.  . Carotid bruit    carotid u/s 10/10: 0.39% bilaterally  . Chronic combined systolic and diastolic CHF (congestive heart failure) ([place])   . Complication of anesthesia 1980s   "w/anal cyst OR, he gave me a saddle block then put a narcotic in spinal cord; had a severe reaction to that" (01/07/2017)  . ED (erectile dysfunction)   . Family history of adverse reaction to anesthesia    "daughter wakes up during OR" (01/07/2017)  . GERD (gastroesophageal reflux disease)   . Gout   . HTN (hypertension)   . Hx of colonic polyps    (sees Dr. Henrene Pastor)  . Hyperlipidemia   . Hypothyroidism   . Moderate to severe aortic stenosis    a. s/p TAVR 02/2017.  [name] Myocardial infarction ([place]) ~ 2000  . Obesity   . Osteoarthritis    "was in my knees, hands" (01/07/2017 )  . PAF (paroxysmal atrial fibrillation) ([place])    a. documented post TAVR.  [name] Precancerous skin lesion    (sees Dr. Allyson Sabal)  . Prostate cancer ([place]) dx'd ~ 2014  . 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 S/P TAVR (transcatheter aortic  valve replacement) 03/04/2017   29 mm Edwards Sapien 3 transcatheter heart valve placed via percutaneous right transfemoral approach    Past Surgical History:  Procedure Laterality Date  . ANUS SURGERY     "opened it back up cause it wouldn't heal; wound up w/a fissure" (01/07/2017)  . CARDIAC CATHETERIZATION  10/29/2005  . CARDIAC CATHETERIZATION N/A 08/27/2016   Procedure: Right/Left Heart Cath and Coronary/Graft Angiography;  Surgeon: Burnell Blanks, MD;  Location: [facility];  Service: Cardiovascular;  Laterality: N/A;  . CATARACT  EXTRACTION W/ INTRAOCULAR LENS  IMPLANT, BILATERAL Bilateral   . COLONOSCOPY  06/30/2008   no repeats needed   . CORONARY ARTERY BYPASS GRAFT  2007   "CABG X4"  . CYST EXCISION PERINEAL  1980s  . HAMMER TOE SURGERY Bilateral   . JOINT REPLACEMENT    . KNEE ARTHROPLASTY  07/30/2011   Procedure: COMPUTER ASSISTED TOTAL KNEE ARTHROPLASTY;  Surgeon: Meredith Pel;  Location: [facility];  Service: Orthopedics;  Laterality: Left;  left total knee arthroplasty  . MASTECTOMY SUBCUTANEOUS Bilateral   . ORIF FINGER / THUMB FRACTURE Right ~ 1980   "repair of thumb injury"  . PROSTATE BIOPSY    . REPLACEMENT TOTAL KNEE BILATERAL Bilateral 2012  . TEE WITHOUT CARDIOVERSION N/A 03/04/2017   Procedure: TRANSESOPHAGEAL ECHOCARDIOGRAM (TEE);  Surgeon: Burnell Blanks, MD;  Location: [facility];  Service: Open Heart Surgery;  Laterality: N/A;  . TRANSCATHETER AORTIC VALVE REPLACEMENT, TRANSFEMORAL N/A 03/04/2017   Procedure: TRANSCATHETER AORTIC VALVE REPLACEMENT, TRANSFEMORAL;  Surgeon: Burnell Blanks, MD;  Location: [facility];  Service: Open Heart Surgery;  Laterality: N/A;    Current Medications: Current Meds  Medication Sig  . aspirin 81 MG tablet Take 1 tablet (81 mg total) by mouth daily.  [name] atorvastatin (LIPITOR) 20 MG tablet Take 20 mg by mouth daily.  . Azelastine-Fluticasone (DYMISTA) 137-50 MCG/ACT SUSP Place 2 puffs into the nose 2 (two) times daily.  . benzonatate (TESSALON) 200 MG capsule Take 1 capsule (200 mg total) by mouth 2 (two) times daily as needed for cough.  . budesonide-formoterol (SYMBICORT) 160-4.5 MCG/ACT inhaler Inhale 2 puffs into the lungs 2 (two) times daily.  . cetirizine (ZYRTEC) 10 MG tablet Take 10 mg by mouth daily.  . chlorpheniramine (CHLOR-TRIMETON) 4 MG tablet Take 2 tablets by mouth (8 mg total) two times daily  . furosemide (LASIX) 80 MG tablet Take 1 tablet (80 mg total) by mouth daily.  . isosorbide mononitrate (IMDUR) 30 MG 24 hr tablet Take 1 tablet  (30 mg total) by mouth daily.  [name] levothyroxine (SYNTHROID, LEVOTHROID) 75 MCG tablet Take 1 tablet (75 mcg total) by mouth daily.  [name] losartan (COZAAR) 25 MG tablet Take 1 tablet (25 mg total) by mouth daily.  . metoprolol (LOPRESSOR) 50 MG tablet Take 1 tablet (50 mg total) by mouth 2 (two) times daily.  . montelukast (SINGULAIR) 10 MG tablet TAKE 1 TABLET IN THE EVENING.  . Multiple Vitamins-Minerals (PRESERVISION AREDS PO) Take 2 tablets by mouth every morning.  . potassium chloride SA (K-DUR,KLOR-CON) 20 MEQ tablet Take 2 tablets (40 mEq total) by mouth daily.  . ranitidine (ZANTAC) 150 MG capsule Take 150 mg by mouth 2 (two) times daily.  . rivaroxaban (XARELTO) 20 MG TABS tablet Take 1 tablet (20 mg total) by mouth daily with breakfast.     Allergies:   Peanut-containing drug products; Sulfonamide derivatives; Amlodipine; and Lisinopril   Social History   Social History  .  Marital status: Married    Spouse name: N/A  . Number of children: N/A  . Years of education: N/A   Occupational History  . general contractor     builds malls   Social History Main Topics  . Smoking status: Former Smoker    Packs/day: 3.50    Years: 13.00    Types: Cigarettes    Quit date: 1963  . Smokeless tobacco: Never Used  . Alcohol use 0.0 oz/week     Comment: 01/07/2017 "might have 2 beers 2-3 times/month"  . Drug use: No  . Sexual activity: No   Other Topics Concern  . None   Social History Narrative   FH of CAD, Male 1st degree relative less than age 92.     Family History:  Family History  Problem Relation Age of Onset  . Heart attack Father 64  . Heart failure Mother 3  . Uterine cancer Mother     ROS:   Please see the history of present illness.  All other systems are reviewed and otherwise negative.    PHYSICAL EXAM:   VS:  BP 112/60   Pulse 98   Ht 5\' 7"  (1.702 m)   Wt 261 lb 12.8 oz (118.8 kg)   SpO2 96%   BMI 41.00 kg/m   BMI: Body mass index is 41 kg/m. GEN:  Well nourished, well developed obese WM, in no acute distress  HEENT: normocephalic, atraumatic Neck: no JVD, carotid bruits, or masses Cardiac: regularly irregular rhythm; no murmurs, rubs, or gallops, no edema  Respiratory:  clear to auscultation bilaterally, normal work of breathing GI: soft, nontender, nondistended, + BS MS: no deformity or atrophy  Skin: warm and dry, no rash. Right groin procedure site with puncture site in tact, early stages of healing with scabbed eschar, mild erythema above site which appears to be healing skin that may have been disrupted Neuro:  Alert and Oriented x 3, Strength and sensation are intact, follows commands Psych: euthymic mood, full affect  Wt Readings from Last 3 Encounters:  03/19/17 261 lb 12.8 oz (118.8 kg)  03/18/17 264 lb (119.7 kg)  03/10/17 265 lb (120.2 kg)      Studies/Labs Reviewed:   EKG:  EKG was ordered today and personally reviewed  With Dr. [name], regularly irregular rhythm 99bpm felt to represent NSR with LBBB and 2:1 competing slow and fast pathways   Recent Labs: 12/23/2016: TSH 2.32 01/07/2017: B Natriuretic Peptide 653.3 02/27/2017: ALT 20 03/05/2017: Magnesium 2.0 03/07/2017: BUN 8; Creatinine, Ser 0.79; Hemoglobin 12.4; Platelets 150; Potassium 3.7; Sodium 134   Lipid Panel    Component Value Date/Time   CHOL 109 09/25/2016 0828   TRIG 133.0 09/25/2016 0828   TRIG 143 06/30/2006 1132   HDL 29.80 (L) 09/25/2016 0828   CHOLHDL 4 09/25/2016 0828   VLDL 26.6 09/25/2016 0828   LDLCALC 53 09/25/2016 0828    Additional studies/ records that were reviewed today include: Summarized above.    ASSESSMENT & PLAN:   1. Aortic stenosis s/p TAVR - overall doing well. Surgical site appears in early-mid stages of healing. It appears that perhaps some of the healing healthy skin around the top was disrupted with mild erythema, perhaps by a band-aid or surgical dressing. It is open to air now. Wife and patient have been applying  antibiotic ointment. I've advised to continue to do so daily, but to also leave it open to air for part of the day to allow it to heal  and keep it from getting macerated. There has not been any suppuration or dehiscence. No evidence of infection. He is feeling great. Has f/u with Dr. Angelena Form on 04/04/17 for post-op echo and f/u. 2. Post-op intraventricular conduction delay/irregular heartbeat - EKG shows a regularly irregular rhythm. Per discussion with Dr. [name], he feels this represents NSR with 2:1 conduction of competing slow and fast pathways. He recommended a 48 hour Holter monitor and to decrease dose of metoprolol to 25mg  BID - because although his actual V rate is in the 90s, Dr. [name] states his sinus rate is likely closer to the 60s. Dr. [name] states he would be happy to review the monitor results upon completion for further recs. Will also update labs today to make sure no metabolic abnormality contributing. 3. Paroxysmal atrial fib - maintaining NSR as above, but with competing pathways. Will update thyroid function. Recheck CBC to ensure stable given recent start of Xarelto. This was started by Dr. Angelena Form. I had reviewed in clinic yesterday with Dr. Burt Knack with regards to the issue of "valvular atrial fib" and it sounds like in general, TAVR has not been felt to be a part of that category and thus Xarelto was agreed upon by the cardiology team as acceptable. CrCl is 19ml/min thus dose is appropriate. 4. CAD - no recent angina. Remains on ASA as well. Follow for any bleeding. 5. Chronic combined CHF - appears euvolemic. He reports he's had a cough for several months. Saw pulm who titrated his GERD regimen with plans for f/u manometry if no improvement. He states PCP asked him to discuss one of his heart medicines with [place] to see if it could be causing cough. He's not sure which one but I suspect losartan. Cough with this med is very rare but is possble. Since we are adjusting metoprolol as above,  I'd prefer not to make too many changes at one time. If his cough persists despite GERD rx, would consider trial off ARB. He's not had any escalation recently.  Disposition: F/u with Dr. Angelena Form as scheduled 04/04/17.    Medication Adjustments/Labs and Tests Ordered: Current medicines are reviewed at length with the patient today.  Concerns regarding medicines are outlined above. Medication changes, Labs and Tests ordered today are summarized above and listed in the Patient Instructions accessible in Encounters.   Signed, [name], PA-C  03/19/2017 11:51 AM    [address], [place], [place]  [address] Phone: [phone]; Fax: [phone]

## 2017-03-18 NOTE — Progress Notes (Signed)
Subjective:    Patient ID: Justin [name], male    DOB: Nov 26, 1933, 83 y.o.   MRN: [id number]  Synopsis: Former patient of Dr. Gwenette Greet who has irritable larynx syndrome as well as mild persistent asthma.' May 2017 CT scan of the sinuses was a poor study, there is no clear acute or chronic sinusitis noted. Previously treated with immunotherapy by Dr. Velora Heckler many years prior.   HPI  Chief Complaint  Patient presents with  . Follow-up    pt states he is doing well, notes persistent nonprod cough Xseveral mos. had a heart valve replacement since last OV.     Justin [name] is still struggling with cough.  He recently had TAVR in July.  He has struggled with heart failure.  During this time he continues to have a cough during this whole time.  He is supposed to start cardiac rehab tomorrow.  He is coughing all day.   Allergic rhinitis > he says that dymista really helps > he continues to take Zyrtec, and he is taking chlorpheniramine > minimal post nasal drip on this regimen > still taking montelukast > this was much worse in April and May when his heart failure was worse.  GERD: > he continues to take Zantac day and night > he denies heartburn > cough is worse after eating.   Asthma: > he reports no dyspnea, no wheezing  Past Medical History:  Diagnosis Date  . Age-related macular degeneration, dry, both eyes   . Allergic    "24/7; 365 days/year; I'm allergic to pollens, dust, all southern grasses/trees, mold, mildue, cats, dogs" (01/07/2017)  . Anginal pain ([place])   . Asthma    sees [name]   . Benign prostatic hypertrophy    (sees Dr. Denman George  . CAD (coronary artery disease)    sees Dr. Haroldine Laws   . Carotid bruit    carotid u/s 10/10: 0.39% bilaterally  . CHF (congestive heart failure) ([place])   . Complication of anesthesia 1980s   "w/anal cyst OR, he gave me a saddle block then put a narcotic in spinal cord; had a severe reaction to that" (01/07/2017)  . Coronary artery  disease involving native coronary artery of native heart without angina pectoris   . Dyspnea    with fluid buildup  . ED (erectile dysfunction)   . Family history of adverse reaction to anesthesia    "daughter wakes up during OR" (01/07/2017)  . GERD (gastroesophageal reflux disease)   . Gout   . Heart murmur   . HTN (hypertension)   . Hx of colonic polyps    (sees Dr. Henrene Pastor)  . Hyperlipidemia   . Hypothyroidism   . Leaky heart valve   . Myocardial infarction ([place]) ~ 2000  . Obesity   . Osteoarthritis    "was in my knees, hands" (01/07/2017 )  . Precancerous skin lesion    (sees Dr. Allyson Sabal)  . Prostate cancer ([place]) dx'd ~ 2014  . S/P CABG (coronary artery bypass graft) 2007    Coronary artery bypass grafting x4 with left internal(Edward Servando Snare, MD)  . 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  HENT: Negative for postnasal drip, rhinorrhea and sinus pressure.   Respiratory: Positive for cough. Negative for shortness of breath and wheezing.  Cardiovascular: Negative for chest pain, palpitations and leg swelling.       Objective:   Physical Exam  Vitals:   03/18/17 0856  BP: 118/64  Pulse: 78  SpO2: 97%  Weight: 264 lb (119.7 kg)  Height: 5\' 7"  (1.702 m)    RA  Gen: well appearing HENT: OP clear, TM's clear, neck supple PULM: CTA B, normal percussion CV: RRR, slight murmur, trace edema GI: BS+, soft, nontender Derm: no cyanosis or rash Psyche: normal mood and affect   CBC    Component Value Date/Time   WBC 14.4 (H) 03/07/2017 0231   RBC 4.59 03/07/2017 0231   HGB 12.4 (L) 03/07/2017 0231   HCT 38.9 (L) 03/07/2017 0231   PLT 150 03/07/2017 0231   MCV 84.7 03/07/2017 0231   MCH 27.0 03/07/2017 0231   MCHC 31.9 03/07/2017 0231    RDW 16.6 (H) 03/07/2017 0231   LYMPHSABS 2.2 01/07/2017 1705   MONOABS 1.5 (H) 01/07/2017 1705   EOSABS 0.5 01/07/2017 1705   BASOSABS 0.1 01/07/2017 1705   Imaging: 12/2015 CT sinuses without significant disease  Labs  12/2016 IgE > 180  Records from his recent trans-aortic valve replacement reviewed     Assessment & Plan:  Cough  Gastroesophageal reflux disease, esophagitis presence not specified  Mild intermittent asthma without complication  Allergic rhinitis, unspecified seasonality, unspecified trigger   Discussion: Berkley has done remarkably well since having his transaortic valve replacement performed last month. However, he continues to struggle with cough. He still has signs and symptoms suggestive of acid reflux is the etiology of cough. I've asked him to stop taking the morning dose of Zantac and start taking a PPI. If this is ineffective that he needs to have esophageal pH probe monitoring and manometry and we may need to consider a Nissen.  In regards to his allergic rhinitis he had an elevated serum IgE several months ago but currently his symptoms are well controlled on his current regimen. I've asked him to stop taking Zyrtec so long as he continues to take Chlor-Trimeton.  Plan: Allergic rhinitis  continue Dymista, montelukast, and Chlor-Trimeton as you are doing  GERD:  continue taking Zantac at night but stop taking the morning dose Start taking Prilosec in the morning, 20 mg daily, make sure your primary care physician and pharmacist know you are taking this  Asthma: Continue taking Dulera until you run out, then resume Symbicort  We will see you back in 6-8 weeks, if the cough has not improved by that point we will perform an esophageal pH probe and manometry monitoring  Current Outpatient Prescriptions:  .  aspirin 81 MG tablet, Take 1 tablet (81 mg total) by mouth daily., Disp: 30 tablet, Rfl: 0 .  atorvastatin (LIPITOR) 20 MG tablet, Take 20 mg by  mouth daily., Disp: , Rfl:  .  Azelastine-Fluticasone (DYMISTA) 137-50 MCG/ACT SUSP, Place 2 puffs into the nose 2 (two) times daily., Disp: 1 Bottle, Rfl: 6 .  benzonatate (TESSALON) 200 MG capsule, Take 1 capsule (200 mg total) by mouth 2 (two) times daily as needed for cough., Disp: 60 capsule, Rfl: 5 .  budesonide-formoterol (SYMBICORT) 160-4.5 MCG/ACT inhaler, Inhale 2 puffs into the lungs 2 (two) times daily., Disp: , Rfl:  .  cetirizine (ZYRTEC) 10 MG tablet, Take 10 mg by mouth daily., Disp: , Rfl:  .  chlorpheniramine (CHLOR-TRIMETON) 4 MG tablet, Take 2 tablets (8 mg total) by mouth 3 (three) times daily. (Patient taking differently: Take 8  mg by mouth 2 (two) times daily. ), Disp: 90 tablet, Rfl: 3 .  furosemide (LASIX) 80 MG tablet, Take 1 tablet (80 mg total) by mouth daily., Disp: 30 tablet, Rfl: 2 .  isosorbide mononitrate (IMDUR) 30 MG 24 hr tablet, Take 1 tablet (30 mg total) by mouth daily., Disp: 90 tablet, Rfl: 3 .  levothyroxine (SYNTHROID, LEVOTHROID) 75 MCG tablet, Take 1 tablet (75 mcg total) by mouth daily., Disp: 90 tablet, Rfl: 3 .  losartan (COZAAR) 25 MG tablet, Take 1 tablet (25 mg total) by mouth daily., Disp: 30 tablet, Rfl: 5 .  metoprolol (LOPRESSOR) 50 MG tablet, Take 1 tablet (50 mg total) by mouth 2 (two) times daily., Disp: 180 tablet, Rfl: 2 .  montelukast (SINGULAIR) 10 MG tablet, TAKE 1 TABLET IN THE EVENING., Disp: 60 tablet, Rfl: 5 .  Multiple Vitamins-Minerals (PRESERVISION AREDS PO), Take 2 tablets by mouth every morning., Disp: , Rfl:  .  potassium chloride SA (K-DUR,KLOR-CON) 20 MEQ tablet, Take 2 tablets (40 mEq total) by mouth daily., Disp: 30 tablet, Rfl: 2 .  ranitidine (ZANTAC) 150 MG capsule, Take 150 mg by mouth 2 (two) times daily., Disp: , Rfl:  .  rivaroxaban (XARELTO) 20 MG TABS tablet, Take 1 tablet (20 mg total) by mouth daily with breakfast., Disp: 30 tablet, Rfl: 3

## 2017-03-19 ENCOUNTER — Encounter: Payer: Self-pay | Admitting: Physician Assistant

## 2017-03-19 ENCOUNTER — Ambulatory Visit (INDEPENDENT_AMBULATORY_CARE_PROVIDER_SITE_OTHER): Payer: Medicare Other | Admitting: Physician Assistant

## 2017-03-19 ENCOUNTER — Ambulatory Visit (INDEPENDENT_AMBULATORY_CARE_PROVIDER_SITE_OTHER): Payer: Medicare Other

## 2017-03-19 VITALS — BP 112/60 | HR 98 | Ht 67.0 in | Wt 261.8 lb

## 2017-03-19 DIAGNOSIS — Z952 Presence of prosthetic heart valve: Secondary | ICD-10-CM | POA: Diagnosis not present

## 2017-03-19 DIAGNOSIS — I480 Paroxysmal atrial fibrillation: Secondary | ICD-10-CM

## 2017-03-19 DIAGNOSIS — I499 Cardiac arrhythmia, unspecified: Secondary | ICD-10-CM | POA: Diagnosis not present

## 2017-03-19 DIAGNOSIS — I2510 Atherosclerotic heart disease of native coronary artery without angina pectoris: Secondary | ICD-10-CM

## 2017-03-19 DIAGNOSIS — I459 Conduction disorder, unspecified: Secondary | ICD-10-CM

## 2017-03-19 DIAGNOSIS — I5042 Chronic combined systolic (congestive) and diastolic (congestive) heart failure: Secondary | ICD-10-CM

## 2017-03-19 MED ORDER — METOPROLOL TARTRATE 25 MG PO TABS: 25.0000 mg | ORAL_TABLET | Freq: Two times a day (BID) | ORAL | 3 refills | Status: DC

## 2017-03-19 NOTE — Patient Instructions (Addendum)
Medication Instructions:  Your physician has recommended you make the following change in your medication:  1.  REDUCE the Metoprolol to 25 mg taking 1 tablet twice a day   Labwork: TODAY:  BMET, MAG, CBC, & TSH  Testing/Procedures: Your physician has recommended that you wear a holter monitor ASAP. Holter monitors are medical devices that record the heart's electrical activity. Doctors most often use these monitors to diagnose arrhythmias. Arrhythmias are problems with the speed or rhythm of the heartbeat. The monitor is a small, portable device. You can wear one while you do your normal daily activities. This is usually used to diagnose what is causing palpitations/syncope (passing out).    Follow-Up: Your physician recommends that you schedule a follow-up appointment in: [place]   Any Other Special Instructions Will Be Listed Below (If Applicable).  Holter Monitoring A Holter monitor is a small device that is used to detect abnormal heart rhythms. It clips to your clothing and is connected by wires to flat, sticky disks (electrodes) that attach to your chest. It is worn continuously for 24-48 hours. Follow these instructions at home:  Wear your Holter monitor at all times, even while exercising and sleeping, for as long as directed by your health care provider.  Make sure that the Holter monitor is safely clipped to your clothing or close to your body as recommended by your health care provider.  Do not get the monitor or wires wet.  Do not put body lotion or moisturizer on your chest.  Keep your skin clean.  Keep a diary of your daily activities, such as walking and doing chores. If you feel that your heartbeat is abnormal or that your heart is fluttering or skipping a beat: ? Record what you are doing when it happens. ? Record what time of day the symptoms occur.  Return your Holter monitor as directed by your health care provider.  Keep  all follow-up visits as directed by your health care provider. This is important. Get help right away if:  You feel lightheaded or you faint.  You have trouble breathing.  You feel pain in your chest, upper arm, or jaw.  You feel sick to your stomach and your skin is pale, cool, or damp.  You heartbeat feels unusual or abnormal. This information is not intended to replace advice given to you by your health care provider. Make sure you discuss any questions you have with your health care provider. Document Released: 05/10/2004 Document Revised: 01/18/2016 Document Reviewed: 03/21/2014 Elsevier Interactive Patient Education  [employer].    If you need a refill on your cardiac medications before your next appointment, please call your pharmacy.

## 2017-03-20 ENCOUNTER — Encounter (INDEPENDENT_AMBULATORY_CARE_PROVIDER_SITE_OTHER): Payer: Self-pay | Admitting: Orthopedic Surgery

## 2017-03-20 ENCOUNTER — Ambulatory Visit (INDEPENDENT_AMBULATORY_CARE_PROVIDER_SITE_OTHER): Payer: Medicare Other

## 2017-03-20 ENCOUNTER — Other Ambulatory Visit: Payer: Self-pay | Admitting: Cardiology

## 2017-03-20 ENCOUNTER — Ambulatory Visit (INDEPENDENT_AMBULATORY_CARE_PROVIDER_SITE_OTHER): Payer: Medicare Other | Admitting: Orthopedic Surgery

## 2017-03-20 DIAGNOSIS — Z96653 Presence of artificial knee joint, bilateral: Secondary | ICD-10-CM | POA: Diagnosis not present

## 2017-03-20 LAB — MAGNESIUM: MAGNESIUM: 2.2 mg/dL (ref 1.6–2.3)

## 2017-03-20 LAB — CBC
Hematocrit: 43.7 % (ref 37.5–51.0)
Hemoglobin: 14.1 g/dL (ref 13.0–17.7)
MCH: 27.6 pg (ref 26.6–33.0)
MCHC: 32.3 g/dL (ref 31.5–35.7)
MCV: 86 fL (ref 79–97)
PLATELETS: 312 10*3/uL (ref 150–379)
RBC: 5.1 x10E6/uL (ref 4.14–5.80)
RDW: 16.5 % — AB (ref 12.3–15.4)
WBC: 14.5 10*3/uL — AB (ref 3.4–10.8)

## 2017-03-20 LAB — BASIC METABOLIC PANEL
BUN/Creatinine Ratio: 20 (ref 10–24)
BUN: 15 mg/dL (ref 8–27)
CALCIUM: 8.8 mg/dL (ref 8.6–10.2)
CO2: 21 mmol/L (ref 20–29)
Chloride: 101 mmol/L (ref 96–106)
Creatinine, Ser: 0.75 mg/dL — ABNORMAL LOW (ref 0.76–1.27)
GFR calc Af Amer: 100 mL/min/{1.73_m2} (ref >59)
GFR, EST NON AFRICAN AMERICAN: 87 mL/min/{1.73_m2} (ref >59)
GLUCOSE: 84 mg/dL (ref 65–99)
POTASSIUM: 4.3 mmol/L (ref 3.5–5.2)
SODIUM: 141 mmol/L (ref 134–144)

## 2017-03-20 LAB — TSH: TSH: 3.68 u[IU]/mL (ref 0.450–4.500)

## 2017-03-20 NOTE — Progress Notes (Signed)
Office Visit Note   Patient: Justin [name]           Date of Birth: 11-03-1933           MRN: [id number] Visit Date: 03/20/2017 Requested by: Laurey Morale, MD [place], [place] [address] PCP: Laurey Morale, MD  Subjective: Chief Complaint  Patient presents with  . Right Knee - Pain  . Left Knee - Pain    HPI: when is a 83 year old patient with bilateral knee pain left and right.  Since of seen him he has had valve replacement done.  He's been diagnosed with congestive heart failure.  He also has had some bridge work done in his teeth.  He hasn't done as much exercise as he is used to doing over the past 4 months.  He is going on a round the world Cruise in January 2019.              ROS: All systems reviewed are negative as they relate to the chief complaint within the history of present illness.  Patient denies  fevers or chills.   Assessment & Plan: Visit Diagnoses:  1. Presence of artificial knee joint, bilateral     Plan: impression is bilateral knee pain with no obvious loosening or infection on clinical examination or radiographic examination.  His knees are now 81 years old.  Red flag signs to report are discussed.at this time this may be referred pain from his back or he may have some inflammatory pain in his knees but there does not appear to be a significant problem with the knee replacements.  Follow-Up Instructions: Return if symptoms worsen or fail to improve.   Orders:  Orders Placed This Encounter  Procedures  . XR Knee 1-2 Views Left  . XR KNEE 3 VIEW RIGHT   No orders of the defined types were placed in this encounter.     Procedures: No procedures performed   Clinical Data: No additional findings.  Objective: Vital Signs: There were no vitals taken for this visit.  Physical Exam:   Constitutional: Patient appears well-developed HEENT:  Head: Normocephalic Eyes:EOM are normal Neck: Normal range of  motion Cardiovascular: Normal rate Pulmonary/chest: Effort normal Neurologic: Patient is alert Skin: Skin is warm Psychiatric: Patient has normal mood and affect    Ortho Exam: orthopedic exam demonstrates no effusion or warmth in either knee.  Full extension and flexion easily past 90 is present in both knees.  Pedal pulses palpable.  There is no groin pain or nerve root tension signs in either leg.  Specialty Comments:  No specialty comments available.  Imaging: Xr Knee 1-2 Views Left  Result Date: 03/20/2017 AP lateral merchant left knee reviewed.  Total knee prosthesis in good position and alignment with no evidence of loosening or effusion.  Xr Knee 3 View Right  Result Date: 03/20/2017 AP lateral merchant right knee reviewed.  Total knee prosthesis in good position and alignment.  No fracture.  No evidence of loosening or effusion.    PMFS History: Patient Active Problem List   Diagnosis Date Noted  . 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 Severe aortic valve stenosis 03/04/2017  . Hypokalemia due to loss of potassium with diuresis 01/10/2017  . Aortic stenosis 01/10/2017  . Systolic heart failure ([place]) 01/10/2017  . Acute on chronic diastolic CHF (congestive heart failure) ([place]) 01/07/2017  . Hypothyroidism 12/23/2016  . Prostate cancer ([place])   . Irritable larynx  syndrome 09/30/2014  . Edema of both legs 09/01/2014  . Asthma, intermittent 09/01/2014  . Aortic valve disorders 01/01/2011  . Coronary artery disease involving native coronary artery of native heart without angina pectoris   . ACUTE BRONCHITIS 09/06/2010  . VIRAL URI 08/24/2010  . GOUT 09/12/2009  . TINEA PEDIS 08/15/2009  . CELLULITIS, FOOT 08/15/2009  . CAD, AUTOLOGOUS BYPASS GRAFT 05/10/2009  . MURMUR 05/08/2009  . BRUIT 05/08/2009  . MUSCLE CRAMPS 12/12/2008  . KNEE SPRAIN 09/21/2008  . BENIGN PROSTATIC HYPERTROPHY 07/07/2008  . OSTEOARTHRITIS 07/07/2008  . CONTUSION,  HIP 05/13/2008  . BENIGN PROSTATIC HYPERTROPHY, WITH OBSTRUCTION 02/01/2008  . LOW BACK PAIN SYNDROME 02/01/2008  . TESTOSTERONE DEFICIENCY 07/03/2007  . HYPERLIPIDEMIA 07/03/2007  . Essential hypertension 07/03/2007  . MYOCARDIAL INFARCTION, HX OF 07/03/2007  . Allergic rhinitis 07/03/2007  . ACTINIC KERATOSIS 07/03/2007  . COLONIC POLYPS, HX OF 07/03/2007  . ACNE ROSACEA, HX OF 07/03/2007  . 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   Past Medical History:  Diagnosis Date  . Age-related macular degeneration, dry, both eyes   . Allergic    "24/7; 365 days/year; I'm allergic to pollens, dust, all southern grasses/trees, mold, mildue, cats, dogs" (01/07/2017)  . Asthma    sees [name]   . Benign prostatic hypertrophy    (sees Dr. Denman George  . CAD (coronary artery disease)    a. s/p CABG 2007. b. Cath 08/2016 - 4/4 patent grafts.  . Carotid bruit    carotid u/s 10/10: 0.39% bilaterally  . Chronic combined systolic and diastolic CHF (congestive heart failure) ([place])   . Complication of anesthesia 1980s   "w/anal cyst OR, he gave me a saddle block then put a narcotic in spinal cord; had a severe reaction to that" (01/07/2017)  . ED (erectile dysfunction)   . Family history of adverse reaction to anesthesia    "daughter wakes up during OR" (01/07/2017)  . GERD (gastroesophageal reflux disease)   . Gout   . HTN (hypertension)   . Hx of colonic polyps    (sees Dr. Henrene Pastor)  . Hyperlipidemia   . Hypothyroidism   . Moderate to severe aortic stenosis    a. s/p TAVR 02/2017.  [name] Myocardial infarction ([place]) ~ 2000  . Obesity   . Osteoarthritis    "was in my knees, hands" (01/07/2017 )  . PAF (paroxysmal atrial fibrillation) ([place])    a. documented post TAVR.  [name] Precancerous skin lesion    (sees Dr. Allyson Sabal)  . Prostate cancer ([place]) dx'd ~ 2014  . 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 Heart attack Father 41  . Heart failure Mother 34  . Uterine cancer Mother     Past Surgical History:  Procedure Laterality Date  . ANUS SURGERY     "opened it back up cause it wouldn't heal; wound up w/a fissure" (01/07/2017)  . CARDIAC CATHETERIZATION  10/29/2005  . CARDIAC CATHETERIZATION N/A 08/27/2016   Procedure: Right/Left Heart Cath and Coronary/Graft Angiography;  Surgeon: Burnell Blanks, MD;  Location: [facility];  Service: Cardiovascular;  Laterality: N/A;  . CATARACT EXTRACTION W/ INTRAOCULAR LENS  IMPLANT, BILATERAL Bilateral   . COLONOSCOPY  06/30/2008   no repeats needed   . CORONARY ARTERY BYPASS GRAFT  2007   "  CABG X4"  . CYST EXCISION PERINEAL  1980s  . HAMMER TOE SURGERY Bilateral   . JOINT REPLACEMENT    . KNEE ARTHROPLASTY  07/30/2011   Procedure: COMPUTER ASSISTED TOTAL KNEE ARTHROPLASTY;  Surgeon: Meredith Pel;  Location: [facility];  Service: Orthopedics;  Laterality: Left;  left total knee arthroplasty  . MASTECTOMY SUBCUTANEOUS Bilateral   . ORIF FINGER / THUMB FRACTURE Right ~ 1980   "repair of thumb injury"  . PROSTATE BIOPSY    . REPLACEMENT TOTAL KNEE BILATERAL Bilateral 2012  . TEE WITHOUT CARDIOVERSION N/A 03/04/2017   Procedure: TRANSESOPHAGEAL ECHOCARDIOGRAM (TEE);  Surgeon: Burnell Blanks, MD;  Location: [facility];  Service: Open Heart Surgery;  Laterality: N/A;  . TRANSCATHETER AORTIC VALVE REPLACEMENT, TRANSFEMORAL N/A 03/04/2017   Procedure: TRANSCATHETER AORTIC VALVE REPLACEMENT, TRANSFEMORAL;  Surgeon: Burnell Blanks, MD;  Location: [facility];  Service: Open Heart Surgery;  Laterality: N/A;   Social History   Occupational History  . general contractor     builds malls   Social History Main Topics  . Smoking status: Former Smoker    Packs/day: 3.50    Years: 13.00    Types: Cigarettes    Quit date: 1963  . Smokeless  tobacco: Never Used  . Alcohol use 0.0 oz/week     Comment: 01/07/2017 "might have 2 beers 2-3 times/month"  . Drug use: No  . Sexual activity: No

## 2017-03-25 ENCOUNTER — Telehealth: Payer: Self-pay | Admitting: Cardiovascular Disease

## 2017-03-25 NOTE — Telephone Encounter (Signed)
New message    Tiffany from [facility] is calling about a holter notification for this pt.

## 2017-03-25 NOTE — Telephone Encounter (Signed)
Reviewed monitor. See monitor results with my note attached.

## 2017-03-25 NOTE — Telephone Encounter (Signed)
Tiffany from the holter department of [employer] calling and states that the patient had 607 ventricular runs with the longest being 45 beats at 148 bpm. She states that she is uploading the report onto the website. Monitor department contacted to upload report.

## 2017-03-26 NOTE — Telephone Encounter (Signed)
Can we let him know that his monitor shows mostly normal heart rhythms but at times his heart speeds up after early beats. We have reviewed with EP and recommendation is for EP visit. He is currently asymptomatic so no rush but an appointment within the next 10-14 days would be optimal. Thanks, chris

## 2017-03-26 NOTE — Telephone Encounter (Signed)
I spoke with pt and reviewed monitor results and recommendation for referral to Dr. [name] with him.  I told him Dr. Olin Pia scheduler would contact him with appt time. Pt reports other than arthritis he is feeling well.  He has been taking tylenol arthritis.  I told him that was OK to take but to follow label instructions.

## 2017-04-01 ENCOUNTER — Telehealth: Payer: Self-pay | Admitting: Cardiovascular Disease

## 2017-04-01 MED ORDER — APIXABAN 5 MG PO TABS: 5.0000 mg | ORAL_TABLET | Freq: Two times a day (BID) | ORAL | 0 refills | Status: DC

## 2017-04-01 NOTE — Telephone Encounter (Signed)
New message    Pt c/o medication issue:  1. Name of Medication:rivaroxaban (XARELTO) 20 MG TABS tablet  2. How are you currently taking this medication (dosage and times per day)? 20mg   3. Are you having a reaction (difficulty breathing--STAT)? yes  4. What is your medication issue? Back pain, joints hurting

## 2017-04-01 NOTE — Telephone Encounter (Signed)
Pt states last week he had extreme pain in his right shoulder socket for 5-7 days. Pt states Monday morning he woke up with stiff and painful neck, yesterday pain/catching in left lower back. Pt continues to have stiff neck and back pain today. Pt denies any blood in urine or stool, painful urination, or  active bleeding Pt states he is using tylenol arthritis with some relief. Pt states he feels symptoms are related to Xarelto.  Pt would like to consider alternative to Xarelto. Pt advised I will forward to [facility], pharmacist, for replacement recommendation for Xarelto.

## 2017-04-01 NOTE — Telephone Encounter (Signed)
Discussed with patient, pt does feel Xarelto is culprit of symptoms, would like to change to Eliquis 5 mg BID. Pt states he currently takes Xarelto in the AM and took it this AM, pt advised start Eliquis tomorrow morning, he is aware it is a BID dose.  Pt will plan to keep appt 04/04/17 with Dr Angelena Form.

## 2017-04-01 NOTE — Telephone Encounter (Signed)
Back pain is reported with Xarelto though rare (3%). Would recommend change to Eliquis 5mg  BID if patient feels Xarelto is culprit of pains.

## 2017-04-04 ENCOUNTER — Other Ambulatory Visit: Payer: Self-pay

## 2017-04-04 ENCOUNTER — Ambulatory Visit (INDEPENDENT_AMBULATORY_CARE_PROVIDER_SITE_OTHER): Payer: Medicare Other | Admitting: Cardiovascular Disease

## 2017-04-04 ENCOUNTER — Encounter: Payer: Self-pay | Admitting: Cardiovascular Disease

## 2017-04-04 ENCOUNTER — Ambulatory Visit (HOSPITAL_COMMUNITY): Payer: Medicare Other | Attending: Cardiovascular Disease

## 2017-04-04 VITALS — BP 132/70 | HR 62 | Ht 67.0 in | Wt 261.0 lb

## 2017-04-04 DIAGNOSIS — I480 Paroxysmal atrial fibrillation: Secondary | ICD-10-CM

## 2017-04-04 DIAGNOSIS — I420 Dilated cardiomyopathy: Secondary | ICD-10-CM | POA: Diagnosis not present

## 2017-04-04 DIAGNOSIS — Z952 Presence of prosthetic heart valve: Secondary | ICD-10-CM | POA: Insufficient documentation

## 2017-04-04 DIAGNOSIS — I25118 Atherosclerotic heart disease of native coronary artery with other forms of angina pectoris: Secondary | ICD-10-CM

## 2017-04-04 DIAGNOSIS — I5032 Chronic diastolic (congestive) heart failure: Secondary | ICD-10-CM

## 2017-04-04 DIAGNOSIS — I350 Nonrheumatic aortic (valve) stenosis: Secondary | ICD-10-CM

## 2017-04-04 DIAGNOSIS — I10 Essential (primary) hypertension: Secondary | ICD-10-CM

## 2017-04-04 DIAGNOSIS — I5030 Unspecified diastolic (congestive) heart failure: Secondary | ICD-10-CM | POA: Diagnosis not present

## 2017-04-04 DIAGNOSIS — I069 Rheumatic aortic valve disease, unspecified: Secondary | ICD-10-CM | POA: Diagnosis not present

## 2017-04-04 MED ORDER — PERFLUTREN LIPID MICROSPHERE
1.0000 mL | INTRAVENOUS | Status: AC | PRN
  Administered 2017-04-04: 2 mL via INTRAVENOUS

## 2017-04-04 NOTE — Progress Notes (Signed)
Chief Complaint  Patient presents with  . Follow-up    One month s/p TAVR    History of Present Illness: 83 yo male with history of HTN, HLD, severe aortic stenosis s/p TAVR, chronic diastolic CHF and CAD s/p 4V CABG who is here today for one month TAVR follow up. He is known to have CAD s/p 4V CABG in March 2007 (LIMA to LAD, SVG to Diagonal, SVG to OM, SVG to RCA). Last cardiac cath in January 2018 with 4/4 patent bypass grafts. He had progression of his aortic stenosis over the last year with worsened dyspnea and acute on chronic CHF. Echo May 2018 with mildly reduced LV systolic function, VOHY=07-37%. His aortic stenosis was severe. He underwent TAVR on 03/04/17 with a 29 mm Edwards Sapien 3 valve from the right transfemoral approach.  He developed atrial fibrillation following his procedure and was placed on IV amiodarone with conversion to sinus rhythm. He has been on ASA and Eliquis (Xarelto had been used initially but pt thought it caused him to have shoulder pain). Follow up visit in our office 03/19/17 and pt noted to have irregular rhythm with intraventricular conduction delay and was felt by our EP team to represent 2:1 conduction of competing fast and slow pathways. Cardiac monitor with sinus rhythm with periods of junctional rhythm, frequent PVCs, frequent PACs and runs of a wide complex tachycardia.   He is here today for follow up. The patient denies any chest pain, dyspnea, palpitations, lower extremity edema, orthopnea, PND, near syncope or syncope. He has rare dizziness early in the morning. He has a 30 lb weight loss over the last 3 months following aggressive diuresis and his TAVR. He feels great overall.    Primary Care Physician: Laurey Morale, MD   Past Medical History:  Diagnosis Date  . Age-related macular degeneration, dry, both eyes   . Allergic    "24/7; 365 days/year; I'm allergic to pollens, dust, all southern grasses/trees, mold, mildue, cats, dogs" (01/07/2017)  .  Asthma    sees [name]   . Benign prostatic hypertrophy    (sees Dr. Denman George  . CAD (coronary artery disease)    a. s/p CABG 2007. b. Cath 08/2016 - 4/4 patent grafts.  . Carotid bruit    carotid u/s 10/10: 0.39% bilaterally  . Chronic combined systolic and diastolic CHF (congestive heart failure) ([place])   . Complication of anesthesia 1980s   "w/anal cyst OR, he gave me a saddle block then put a narcotic in spinal cord; had a severe reaction to that" (01/07/2017)  . ED (erectile dysfunction)   . Family history of adverse reaction to anesthesia    "daughter wakes up during OR" (01/07/2017)  . GERD (gastroesophageal reflux disease)   . Gout   . HTN (hypertension)   . Hx of colonic polyps    (sees Dr. Henrene Pastor)  . Hyperlipidemia   . Hypothyroidism   . Moderate to severe aortic stenosis    a. s/p TAVR 02/2017.  [name] Myocardial infarction ([place]) ~ 2000  . Obesity   . Osteoarthritis    "was in my knees, hands" (01/07/2017 )  . PAF (paroxysmal atrial fibrillation) ([place])    a. documented post TAVR.  [name] Precancerous skin lesion    (sees Dr. Allyson Sabal)  . Prostate cancer ([place]) dx'd ~ 2014  . 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   29 mm Edwards Sapien 3 transcatheter heart valve placed  via percutaneous right transfemoral approach    Past Surgical History:  Procedure Laterality Date  . ANUS SURGERY     "opened it back up cause it wouldn't heal; wound up w/a fissure" (01/07/2017)  . CARDIAC CATHETERIZATION  10/29/2005  . CARDIAC CATHETERIZATION N/A 08/27/2016   Procedure: Right/Left Heart Cath and Coronary/Graft Angiography;  Surgeon: Burnell Blanks, MD;  Location: [facility];  Service: Cardiovascular;  Laterality: N/A;  . CATARACT EXTRACTION W/ INTRAOCULAR LENS  IMPLANT, BILATERAL Bilateral   . COLONOSCOPY  06/30/2008   no repeats needed   . CORONARY ARTERY BYPASS GRAFT  2007   "CABG X4"  . CYST EXCISION PERINEAL  1980s  . HAMMER  TOE SURGERY Bilateral   . JOINT REPLACEMENT    . KNEE ARTHROPLASTY  07/30/2011   Procedure: COMPUTER ASSISTED TOTAL KNEE ARTHROPLASTY;  Surgeon: Meredith Pel;  Location: [facility];  Service: Orthopedics;  Laterality: Left;  left total knee arthroplasty  . MASTECTOMY SUBCUTANEOUS Bilateral   . ORIF FINGER / THUMB FRACTURE Right ~ 1980   "repair of thumb injury"  . PROSTATE BIOPSY    . REPLACEMENT TOTAL KNEE BILATERAL Bilateral 2012  . TEE WITHOUT CARDIOVERSION N/A 03/04/2017   Procedure: TRANSESOPHAGEAL ECHOCARDIOGRAM (TEE);  Surgeon: Burnell Blanks, MD;  Location: [facility];  Service: Open Heart Surgery;  Laterality: N/A;  . TRANSCATHETER AORTIC VALVE REPLACEMENT, TRANSFEMORAL N/A 03/04/2017   Procedure: TRANSCATHETER AORTIC VALVE REPLACEMENT, TRANSFEMORAL;  Surgeon: Burnell Blanks, MD;  Location: [facility];  Service: Open Heart Surgery;  Laterality: N/A;    Current Outpatient Prescriptions  Medication Sig Dispense Refill  . apixaban (ELIQUIS) 5 MG TABS tablet Take 1 tablet (5 mg total) by mouth 2 (two) times daily. 60 tablet 0  . aspirin 81 MG tablet Take 1 tablet (81 mg total) by mouth daily. 30 tablet 0  . atorvastatin (LIPITOR) 20 MG tablet Take 20 mg by mouth daily.    . Azelastine-Fluticasone (DYMISTA) 137-50 MCG/ACT SUSP Place 2 puffs into the nose 2 (two) times daily. 1 Bottle 6  . benzonatate (TESSALON) 200 MG capsule Take 1 capsule (200 mg total) by mouth 2 (two) times daily as needed for cough. 60 capsule 5  . budesonide-formoterol (SYMBICORT) 160-4.5 MCG/ACT inhaler Inhale 2 puffs into the lungs 2 (two) times daily.    . cetirizine (ZYRTEC) 10 MG tablet Take 10 mg by mouth daily.    . chlorpheniramine (CHLOR-TRIMETON) 4 MG tablet Take 2 tablets by mouth (8 mg total) two times daily    . furosemide (LASIX) 80 MG tablet Take 1 tablet (80 mg total) by mouth daily. 30 tablet 2  . isosorbide mononitrate (IMDUR) 30 MG 24 hr tablet Take 1 tablet (30 mg total) by mouth daily.  90 tablet 3  . levothyroxine (SYNTHROID, LEVOTHROID) 75 MCG tablet Take 1 tablet (75 mcg total) by mouth daily. 90 tablet 3  . losartan (COZAAR) 25 MG tablet Take 1 tablet (25 mg total) by mouth daily. 30 tablet 5  . metoprolol tartrate (LOPRESSOR) 25 MG tablet Take 1 tablet (25 mg total) by mouth 2 (two) times daily. 180 tablet 3  . montelukast (SINGULAIR) 10 MG tablet TAKE 1 TABLET IN THE EVENING. 60 tablet 5  . Multiple Vitamins-Minerals (PRESERVISION AREDS PO) Take 2 tablets by mouth every morning.    . potassium chloride SA (K-DUR,KLOR-CON) 20 MEQ tablet TAKE (2) TABLETS DAILY. 180 tablet 3  . ranitidine (ZANTAC) 150 MG capsule Take 150 mg by mouth 2 (two)  times daily.     No current facility-administered medications for this visit.    Facility-Administered Medications Ordered in Other Visits  Medication Dose Route Frequency Provider Last Rate Last Dose  . perflutren lipid microspheres (DEFINITY) IV suspension  1-10 mL Intravenous PRN Burnell Blanks, MD   2 mL at 04/04/17 1028    Allergies  Allergen Reactions  . Peanut-Containing Drug Products Anaphylaxis  . Sulfonamide Derivatives Anaphylaxis  . Amlodipine Swelling    Swelling in ankles  . Lisinopril Cough    cough  . Xarelto [Rivaroxaban] Other (See Comments)    Back pain    Social History   Social History  . Marital status: Married    Spouse name: N/A  . Number of children: N/A  . Years of education: N/A   Occupational History  . general contractor     builds malls   Social History Main Topics  . Smoking status: Former Smoker    Packs/day: 3.50    Years: 13.00    Types: Cigarettes    Quit date: 1963  . Smokeless tobacco: Never Used  . Alcohol use 0.0 oz/week     Comment: 01/07/2017 "might have 2 beers 2-3 times/month"  . Drug use: No  . Sexual activity: No   Other Topics Concern  . Not on file   Social History Narrative   FH of CAD, Male 1st degree relative less than age 37.    Family History   Problem Relation Age of Onset  . Heart attack Father 53  . Heart failure Mother 52  . Uterine cancer Mother     Review of Systems:  As stated in the HPI and otherwise negative.   BP 132/70   Pulse 62   Ht 5\' 7"  (1.702 m)   Wt 261 lb (118.4 kg)   SpO2 98%   BMI 40.88 kg/m   Physical Examination:  General: Well developed, well nourished, NAD  HEENT: OP clear, mucus membranes moist  SKIN: warm, dry. No rashes. Neuro: No focal deficits  Musculoskeletal: Muscle strength 5/5 all ext  Psychiatric: Mood and affect normal  Neck: No JVD, no carotid bruits, no thyromegaly, no lymphadenopathy.  Lungs:Clear bilaterally, no wheezes, rhonci, crackles Cardiovascular: Regular rate and rhythm. No murmurs, gallops or rubs. Abdomen:Soft. Bowel sounds present. Non-tender.  Extremities: No lower extremity edema. Pulses are 2 + in the bilateral DP/PT.  Echo 04/04/17 - Procedure narrative: Transthoracic echocardiography for left   ventricular function evaluation, for right ventricular function   evaluation, and for assessment of valvular function. Image   quality was suboptimal. Intravenous contrast (Definity) was   administered to enhance regional wall motion assessment and   opacify the LV. - Left ventricle: The cavity size was normal. There was moderate   concentric hypertrophy. Systolic function was mildly to   moderately reduced. The estimated ejection fraction was in the   range of 40% to 45%. Inferior and distal inferoseptal akinesis.   Doppler parameters are consistent with abnormal left ventricular   relaxation (grade 1 diastolic dysfunction). The E/e&' ratio is   >15, suggesting elevated LV filling pressure. - Aortic valve: s/p 29 mm Sapien 3 TAVR bioprosthesis. No   obstruction. Mean gradient (S): 8 mm Hg. Peak gradient (S): 15 mm   Hg. Valve area (VTI): 1.82 cm^2. Valve area (Vmax): 1.53 cm^2.   Valve area (Vmean): 1.61 cm^2. - Aorta: Ascending aortic diameter: 39 mm (S). -  Ascending aorta: The ascending aorta is mildly dilated. - Left atrium:  Moderately dilated. - Right ventricle: The cavity size was mildly dilated. mildly   reduced systolic function. - Right atrium: The atrium was mildly dilated. - Atrial septum: No defect or patent foramen ovale was identified. - Inferior vena cava: The vessel was normal in size. The   respirophasic diameter changes were in the normal range (>= 50%),   consistent with normal central venous pressure.  Impressions:  - Compared to a prior study in 02/2017, the LVEF is unchanged. The   TAVR gradient is stable.    Cardiac cath 08/27/16: Diagnostic Diagram          EKG:  EKG is  ordered today The ekg ordered today demonstrates atrial fib with rate 71 bpm. PVCs. IVCD.   Recent Labs: 01/07/2017: B Natriuretic Peptide 653.3 02/27/2017: ALT 20 03/19/2017: BUN 15; Creatinine, Ser 0.75; Hemoglobin 14.1; Magnesium 2.2; Platelets 312; Potassium 4.3; Sodium 141; TSH 3.680   Lipid Panel    Component Value Date/Time   CHOL 109 09/25/2016 0828   TRIG 133.0 09/25/2016 0828   TRIG 143 06/30/2006 1132   HDL 29.80 (L) 09/25/2016 0828   CHOLHDL 4 09/25/2016 0828   VLDL 26.6 09/25/2016 0828   LDLCALC 53 09/25/2016 0828     Wt Readings from Last 3 Encounters:  04/04/17 261 lb (118.4 kg)  03/19/17 261 lb 12.8 oz (118.8 kg)  03/18/17 264 lb (119.7 kg)     Other studies Reviewed: Additional studies/ records that were reviewed today include:  Review of the above records demonstrates:    Assessment and Plan:   1. CAD s/p CABG with angina: No chest pain suggestive of unstable angina. He is on Imdur. Will continue ASA, statin, beta blocker and Imdur.       2. Aortic valve stenosis: He is now s/p TAVR on 03/04/17 and is doing well. He is having no dyspnea. He is NYHA class 2. Echo today shows mild LV systolic dysfunction, unchanged. Normally functioning bioprosthetic AVR. No AI is seen. Continue ASA along with Eliquis.   3.  HTN: BP controlled. No changes  4. HLD: Lipids at goal. Continue staitn.   5. Chronic diastolic CHF: Volume status is stable on Lasix. Weight down to 259 lbs at home. That is down 30 lbs from several months.   6. Atrial fibrillation, paroxysmal: He has evidence of junctional bradycardia and PVCs as well as wide complex tachycardia: For now will continue beta blocker. EP consult is pending.   Current medicines are reviewed at length with the patient today.  The patient does not have concerns regarding medicines.  The following changes have been made:  no change  Labs/ tests ordered today include:   Orders Placed This Encounter  Procedures  . EKG 12-Lead   Disposition:  Follow up with  Me in 6 months  Signed, [name], MD 04/04/2017 11:44 AM    [facility] [address], [place], [place]  [address] Phone: [phone]; Fax: [phone]

## 2017-04-04 NOTE — Patient Instructions (Signed)
Medication Instructions:  Your physician recommends that you continue on your current medications as directed. Please refer to the Current Medication list given to you today.   Labwork: none  Testing/Procedures: none  Follow-Up: Your physician recommends that you schedule a follow-up appointment in: 6 months. Please call our office in about 3 months to schedule this appointment.   Keep upcoming appointment with Dr. [name]   Any Other Special Instructions Will Be Listed Below (If Applicable).     If you need a refill on your cardiac medications before your next appointment, please call your pharmacy.

## 2017-04-08 ENCOUNTER — Encounter: Payer: Self-pay | Admitting: Internal Medicine

## 2017-04-08 ENCOUNTER — Ambulatory Visit (INDEPENDENT_AMBULATORY_CARE_PROVIDER_SITE_OTHER): Payer: Medicare Other | Admitting: Internal Medicine

## 2017-04-08 VITALS — BP 132/68 | HR 81 | Ht 67.0 in | Wt 262.0 lb

## 2017-04-08 DIAGNOSIS — I493 Ventricular premature depolarization: Secondary | ICD-10-CM | POA: Diagnosis not present

## 2017-04-08 DIAGNOSIS — I480 Paroxysmal atrial fibrillation: Secondary | ICD-10-CM

## 2017-04-08 NOTE — Progress Notes (Signed)
ELECTROPHYSIOLOGY CONSULT NOTE  Patient ID: Justin [name], MRN: [id number], DOB/AGE: 81-Sep-1935 81 y.o. Admit date: (Not on file) Date of Consult: 04/08/2017  Primary Physician: Laurey Morale, MD Primary Cardiologist: Cmac     Justin [name] is a 83 y.o. male who is being seen today for the evaluation of abnormal monitor at the request of Cmac.    HPI Justin [name] is a 83 y.o. male  Referred because of an abnormal Holter monitor.  He has a history of coronary artery disease with prior bypass surgery in 2007 and developed aortic stenosis and underwent TAVR 7/18. He was complicated by postoperative atrial fibrillation. Treated with anticoagulation and amiodarone  He had an abnormal ECG following his procedure that demonstrated group beating and a higher V a rate. I initially thought maybe it was 1:2 conduction over fast pathway and slow pathway based on subsequent monitoring, I suspect it is junctional rhythm. He has had episodes of abrupt presyncope which are rapidly terminating with a total duration being just a few seconds. He has not had tachycardia palpitations. He has not had syncope.  Following TAVR, his shortness of breath which has been very limiting is markedly attenuated he's had significant edema which is improved. He has eliminated sodium from his diet. His volume intake is decreasing.   Holter was personally reviewed. It demonstrated frequent junctional beats which are accelerated resulting in AV dissociation as well as PVCs.  He also had nonsustained ventricular tachycardia lasting about 15 seconds   Catheterization 1/18 demonstrated patent grafts. Echocardiogram EF 40-45%   Past Medical History:  Diagnosis Date  . Age-related macular degeneration, dry, both eyes   . Allergic    "24/7; 365 days/year; I'm allergic to pollens, dust, all southern grasses/trees, mold, mildue, cats, dogs" (01/07/2017)  . Asthma    sees [name]   . Benign prostatic  hypertrophy    (sees Dr. Denman George  . CAD (coronary artery disease)    a. s/p CABG 2007. b. Cath 08/2016 - 4/4 patent grafts.  . Carotid bruit    carotid u/s 10/10: 0.39% bilaterally  . Chronic combined systolic and diastolic CHF (congestive heart failure) ([place])   . Complication of anesthesia 1980s   "w/anal cyst OR, he gave me a saddle block then put a narcotic in spinal cord; had a severe reaction to that" (01/07/2017)  . ED (erectile dysfunction)   . Family history of adverse reaction to anesthesia    "daughter wakes up during OR" (01/07/2017)  . GERD (gastroesophageal reflux disease)   . Gout   . HTN (hypertension)   . Hx of colonic polyps    (sees Dr. Henrene Pastor)  . Hyperlipidemia   . Hypothyroidism   . Moderate to severe aortic stenosis    a. s/p TAVR 02/2017.  Marland [name] Myocardial infarction ([place]) ~ 2000  . Obesity   . Osteoarthritis    "was in my knees, hands" (01/07/2017 )  . PAF (paroxysmal atrial fibrillation) ([place])    a. documented post TAVR.  Marland [name] Precancerous skin lesion    (sees Dr. Allyson Sabal)  . Prostate cancer (Justin [name]) dx'd ~ 2014  . 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 ANUS SURGERY     "opened it back up cause  it wouldn't heal; wound up w/a fissure" (01/07/2017)  . CARDIAC CATHETERIZATION  10/29/2005  . CARDIAC CATHETERIZATION N/A 08/27/2016   Procedure: Right/Left Heart Cath and Coronary/Graft Angiography;  Surgeon: Burnell Blanks, MD;  Location: [facility];  Service: Cardiovascular;  Laterality: N/A;  . CATARACT EXTRACTION W/ INTRAOCULAR LENS  IMPLANT, BILATERAL Bilateral   . COLONOSCOPY  06/30/2008   no repeats needed   . CORONARY ARTERY BYPASS GRAFT  2007   "CABG X4"  . CYST EXCISION PERINEAL  1980s  . HAMMER TOE SURGERY Bilateral   .  JOINT REPLACEMENT    . KNEE ARTHROPLASTY  07/30/2011   Procedure: COMPUTER ASSISTED TOTAL KNEE ARTHROPLASTY;  Surgeon: Meredith Pel;  Location: [facility];  Service: Orthopedics;  Laterality: Left;  left total knee arthroplasty  . MASTECTOMY SUBCUTANEOUS Bilateral   . ORIF FINGER / THUMB FRACTURE Right ~ 1980   "repair of thumb injury"  . PROSTATE BIOPSY    . REPLACEMENT TOTAL KNEE BILATERAL Bilateral 2012  . TEE WITHOUT CARDIOVERSION N/A 03/04/2017   Procedure: TRANSESOPHAGEAL ECHOCARDIOGRAM (TEE);  Surgeon: Burnell Blanks, MD;  Location: [facility];  Service: Open Heart Surgery;  Laterality: N/A;  . TRANSCATHETER AORTIC VALVE REPLACEMENT, TRANSFEMORAL N/A 03/04/2017   Procedure: TRANSCATHETER AORTIC VALVE REPLACEMENT, TRANSFEMORAL;  Surgeon: Burnell Blanks, MD;  Location: [facility];  Service: Open Heart Surgery;  Laterality: N/A;     Home Meds: Prior to Admission medications   Medication Sig Start Date End Date Taking? Authorizing Provider  apixaban (ELIQUIS) 5 MG TABS tablet Take 1 tablet (5 mg total) by mouth 2 (two) times daily. 04/01/17  Yes Burnell Blanks, MD  aspirin 81 MG tablet Take 1 tablet (81 mg total) by mouth daily. 03/07/17  Yes Reino Bellis B, NP  atorvastatin (LIPITOR) 20 MG tablet Take 20 mg by mouth daily.   Yes [provider]  Azelastine-Fluticasone (DYMISTA) 137-50 MCG/ACT SUSP Place 2 puffs into the nose 2 (two) times daily. 12/02/16  Yes Juanito Doom, MD  benzonatate (TESSALON) 200 MG capsule Take 1 capsule (200 mg total) by mouth 2 (two) times daily as needed for cough. 01/30/17  Yes Laurey Morale, MD  budesonide-formoterol [facility]) 160-4.5 MCG/ACT inhaler Inhale 2 puffs into the lungs 2 (two) times daily.   Yes [provider]  cetirizine (ZYRTEC) 10 MG tablet Take 10 mg by mouth daily.   Yes [provider]  chlorpheniramine (CHLOR-TRIMETON) 4 MG tablet Take 2 tablets by mouth (8 mg total) two times daily   Yes  [provider]  furosemide (LASIX) 80 MG tablet Take 1 tablet (80 mg total) by mouth daily. 01/10/17  Yes Daune Perch, NP  isosorbide mononitrate (IMDUR) 30 MG 24 hr tablet Take 1 tablet (30 mg total) by mouth daily. 09/17/16  Yes Laurey Morale, MD  levothyroxine (SYNTHROID, LEVOTHROID) 75 MCG tablet Take 1 tablet (75 mcg total) by mouth daily. 09/30/16  Yes Laurey Morale, MD  losartan (COZAAR) 25 MG tablet Take 1 tablet (25 mg total) by mouth daily. 01/11/17  Yes Daune Perch, NP  metoprolol tartrate (LOPRESSOR) 25 MG tablet Take 1 tablet (25 mg total) by mouth 2 (two) times daily. 03/19/17 06/17/17 Yes Dunn, Dayna N, PA-C  montelukast (SINGULAIR) 10 MG tablet TAKE 1 TABLET IN THE EVENING. 01/06/17  Yes McQuaid, Ronie Spies, MD  Multiple Vitamins-Minerals (PRESERVISION AREDS PO) Take 2 tablets by mouth every morning.   Yes [provider]  potassium chloride SA (K-DUR,KLOR-CON)  20 MEQ tablet TAKE (2) TABLETS DAILY. 03/20/17  Yes Dunn, Dayna N, PA-C  ranitidine (ZANTAC) 150 MG capsule Take 150 mg by mouth 2 (two) times daily.   Yes [provider]    Allergies:  Allergies  Allergen Reactions  . Peanut-Containing Drug Products Anaphylaxis  . Sulfonamide Derivatives Anaphylaxis  . Amlodipine Swelling    Swelling in ankles  . Lisinopril Cough    cough  . Xarelto [Rivaroxaban] Other (See Comments)    Back pain    Social History   Social History  . Marital status: Married    Spouse name: N/A  . Number of children: N/A  . Years of education: N/A   Occupational History  . general contractor     builds malls   Social History Main Topics  . Smoking status: Former Smoker    Packs/day: 3.50    Years: 13.00    Types: Cigarettes    Quit date: 1963  . Smokeless tobacco: Never Used  . Alcohol use 0.0 oz/week     Comment: 01/07/2017 "might have 2 beers 2-3 times/month"  . Drug use: No  . Sexual activity: No   Other Topics Concern  . Not on file   Social  History Narrative   FH of CAD, Male 1st degree relative less than age 40.     Family History  Problem Relation Age of Onset  . Heart attack Father 36  . Heart failure Mother 57  . Uterine cancer Mother      ROS:  Please see the history of present illness.     All other systems reviewed and negative.    Physical Exam: Blood pressure 132/68, pulse 81, height _0  (1.702 m), weight 262 lb (118.8 kg), SpO2 98 %. General: Well developed, well nourished male in no acute distress. Head: Normocephalic, atraumatic, sclera non-icteric, no xanthomas, nares are without discharge. EENT: normal  Lymph Nodes:  none Neck: Negative for carotid bruits. JVD 8 Back:without scoliosis kyphosis  Lungs: Clear bilaterally to auscultation without wheezes, rales, or rhonchi. Breathing is unlabored. Heart: RRR with S1 S2. N 2/6 systolic murmur . No rubs, or gallops appreciated. Abdomen: Soft, non-tender, non-distended with normoactive bowel sounds. No hepatomegaly. No rebound/guarding. No obvious abdominal masses. Msk:  Strength and tone appear normal for age. Extremities: No clubbing or cyanosis. 2+ edema.  Distal pedal pulses are 2+ and equal bilaterally. Skin: Warm and Dry Neuro: Alert and oriented X 3. CN III-XII intact Grossly normal sensory and motor function . Psych:  Responds to questions appropriately with a normal affect.      Labs: Cardiac Enzymes No results for input(s): CKTOTAL, CKMB, TROPONINI in the last 72 hours. CBC Lab Results  Component Value Date   WBC 14.5 (H) 03/19/2017   HGB 14.1 03/19/2017   HCT 43.7 03/19/2017   MCV 86 03/19/2017   PLT 312 03/19/2017   PROTIME: No results for input(s): LABPROT, INR in the last 72 hours. Chemistry No results for input(s): NA, K, CL, CO2, BUN, CREATININE, CALCIUM, PROT, BILITOT, ALKPHOS, ALT, AST, GLUCOSE in the last 168 hours.  Invalid input(s): LABALBU Lipids Lab Results  Component Value Date   CHOL 109 09/25/2016   HDL 29.80 (L)  09/25/2016   LDLCALC 53 09/25/2016   TRIG 133.0 09/25/2016   BNP No results found for: PROBNP Thyroid Function Tests: No results for input(s): TSH, T4TOTAL, T3FREE, THYROIDAB in the last 72 hours.  Invalid input(s): FREET3 Miscellaneous No results found for: DDIMER  Radiology/Studies:  Xr Knee 1-2 Views Left  Result Date: 03/20/2017 AP lateral merchant left knee reviewed.  Total knee prosthesis in good position and alignment with no evidence of loosening or effusion.  Xr Knee 3 View Right  Result Date: 03/20/2017 AP lateral merchant right knee reviewed.  Total knee prosthesis in good position and alignment.  No fracture.  No evidence of loosening or effusion.   EKG:  15 May sinus rhythm at 65 with frequent ventricular ectopy and evidence of sinus node dysfunction with sinus arrest. Intervals 24/10/45   25 July wide-complex rhythm with a left bundle branch configuration lead V1 and nonspecific IVCD configuration elsewhere QRS duration 140 ms sinus bradycardia with group beating with presumably a junctional origin  10 August sinus bradycardia with a junctional escape rhythm again with a left bundle branch block morphology in the anterior precordium and nonspecific morphology in the lateral leads there is also frequent ventricular ectopy   Assessment and Plan:  Ventricular tachycardia- prolonged- nonsustained  Sinus node dysfunction  Junctional rhythm  Post TAVR left bundle branch block/IVCD  Cardiomyopathy EF 40-45%   The patient has significant arrhythmias that will predate and postdate his TAVR. He has postprocedural left bundle branch block/IVCD for which there is a literature outlining a higher mortality, higher incidence of pacing as well as a prolonged monitoring study we are in bradycardia arrhythmias and tach arrhythmias were identified and for which devices were subsequently implanted.  He's had recurrent presyncope. The brevity of these events suggest a rhythmic  cause which could either be bradycardia or tachycardia  With his cardiomyopathy is ventricular tachycardia is worrisome.  I've discussed this with Dr. Kendra Opitz was inclined towards device therapy as opposed diagnostics  More than 50% of 95 min was spent in counseling related to the above    Virl Axe

## 2017-04-11 ENCOUNTER — Telehealth: Payer: Self-pay | Admitting: Internal Medicine

## 2017-04-11 DIAGNOSIS — I472 Ventricular tachycardia, unspecified: Secondary | ICD-10-CM

## 2017-04-11 DIAGNOSIS — Z01812 Encounter for preprocedural laboratory examination: Secondary | ICD-10-CM

## 2017-04-11 NOTE — Telephone Encounter (Signed)
Mr.Sobecki is calling to find out what was decided about placing the pacemaker or defib. He states he is still having episodes of light dizziness, ( he calls them Hot Flashes), heart skips a beat . Please call

## 2017-04-11 NOTE — Telephone Encounter (Signed)
Will review with Dr. [name] prior to calling the patient back.

## 2017-04-14 ENCOUNTER — Telehealth: Payer: Self-pay | Admitting: Cardiovascular Disease

## 2017-04-14 NOTE — Telephone Encounter (Signed)
Pt is calling just to touch base with Dr. Angelena Form and RN, to follow-up on the status of getting things in line for him to be scheduled for his needed device.  Pt states that he saw Dr [name] on 8/14 and at that visit, it was discussed that the pt will need a device. Pt just wants to know if Dr Angelena Form was aware of his OV with Dr [name], and what does he suggest from here on out.  Informed the pt that Dr. Angelena Form and RN are out of the office today, but I will route this message to them for further review and follow-up with the pt, upon return to the office.  Pt verbalized understanding and agrees with this plan.

## 2017-04-14 NOTE — Telephone Encounter (Signed)
Can you let Mr. Blann know that I spoke to Dr. [name] and I am aware of the plans for the device and I agree with those plans. I am not sure when Dr. [name] is planning to see him back. I will route this to Dr. [name] also. Thanks, chris

## 2017-04-14 NOTE — Telephone Encounter (Signed)
New message   Pt states he met with Dr. [name] about a pacemaker and he was supposed to talk with Dr. Angelena Form. Pt states no one called him and let him know the details of what Dr. Angelena Form thinks. He requests a call back

## 2017-04-17 ENCOUNTER — Encounter: Payer: Self-pay | Admitting: *Deleted

## 2017-04-17 NOTE — Telephone Encounter (Signed)
I called and spoke with the patient. He is agreeable with scheduling an ICD implant as he is aware that this is the discussion between Dr. [name] and Dr. Angelena Form. I advised him the 1st available date is 05/02/17- he is agreeable with this date. He will come to the office on 04/23/17 for his pre -procedure labs and to pick up his surgical scrub.   Detailed letter of instructions mailed to the patient.

## 2017-04-17 NOTE — Telephone Encounter (Signed)
Pt has been notified and procedure scheduled.

## 2017-04-21 ENCOUNTER — Telehealth: Payer: Self-pay | Admitting: Internal Medicine

## 2017-04-21 NOTE — Telephone Encounter (Signed)
Pt wants to verify  about  the procedure he is scheduled to have on 05/02/18. Pt is to have ICD implant. Pt verbalized understanding.

## 2017-04-21 NOTE — Telephone Encounter (Signed)
New message    Pt is calling asking for a call back. He has some questions about the instructions for his procedure. Please call.

## 2017-04-22 ENCOUNTER — Telehealth: Payer: Self-pay | Admitting: Internal Medicine

## 2017-04-22 NOTE — Telephone Encounter (Signed)
Justin [name] is aware that he can proceed with his root canal after device placement. Dental procedures do not require antibiotic therapy directly related to AICD. He verbalizes understanding and is appreciative.

## 2017-04-22 NOTE — Telephone Encounter (Signed)
New message      Pt is scheduled to have a pacemaker/defib put in on sept 7th.  He is also scheduled to have a root canel on sept 20th.  Is the root canel scheduled out far enough from the pacemaker/defib?  Pt is now on antibiotic for 10 days.  Please advise

## 2017-04-23 ENCOUNTER — Other Ambulatory Visit: Payer: Medicare Other | Admitting: *Deleted

## 2017-04-23 DIAGNOSIS — I472 Ventricular tachycardia, unspecified: Secondary | ICD-10-CM

## 2017-04-23 DIAGNOSIS — Z01812 Encounter for preprocedural laboratory examination: Secondary | ICD-10-CM | POA: Diagnosis not present

## 2017-04-23 LAB — CBC WITH DIFFERENTIAL/PLATELET
BASOS: 1 %
Basophils Absolute: 0.1 10*3/uL (ref 0.0–0.2)
EOS (ABSOLUTE): 0.3 10*3/uL (ref 0.0–0.4)
EOS: 3 %
HEMATOCRIT: 42.1 % (ref 37.5–51.0)
HEMOGLOBIN: 13.9 g/dL (ref 13.0–17.7)
IMMATURE GRANS (ABS): 0 10*3/uL (ref 0.0–0.1)
IMMATURE GRANULOCYTES: 0 %
LYMPHS: 19 %
Lymphocytes Absolute: 2 10*3/uL (ref 0.7–3.1)
MCH: 27.9 pg (ref 26.6–33.0)
MCHC: 33 g/dL (ref 31.5–35.7)
MCV: 84 fL (ref 79–97)
MONOCYTES: 7 %
Monocytes Absolute: 0.8 10*3/uL (ref 0.1–0.9)
NEUTROS ABS: 7.4 10*3/uL — AB (ref 1.4–7.0)
NEUTROS PCT: 70 %
PLATELETS: 241 10*3/uL (ref 150–379)
RBC: 4.99 x10E6/uL (ref 4.14–5.80)
RDW: 15.8 % — ABNORMAL HIGH (ref 12.3–15.4)
WBC: 10.5 10*3/uL (ref 3.4–10.8)

## 2017-04-23 LAB — BASIC METABOLIC PANEL
BUN/Creatinine Ratio: 18 (ref 10–24)
BUN: 13 mg/dL (ref 8–27)
CALCIUM: 8.8 mg/dL (ref 8.6–10.2)
CO2: 22 mmol/L (ref 20–29)
CREATININE: 0.74 mg/dL — AB (ref 0.76–1.27)
Chloride: 101 mmol/L (ref 96–106)
GFR, EST AFRICAN AMERICAN: 101 mL/min/{1.73_m2} (ref >59)
GFR, EST NON AFRICAN AMERICAN: 87 mL/min/{1.73_m2} (ref >59)
Glucose: 119 mg/dL — ABNORMAL HIGH (ref 65–99)
POTASSIUM: 3.7 mmol/L (ref 3.5–5.2)
Sodium: 142 mmol/L (ref 134–144)

## 2017-04-29 ENCOUNTER — Other Ambulatory Visit: Payer: Self-pay | Admitting: Cardiology

## 2017-04-29 ENCOUNTER — Other Ambulatory Visit: Payer: Self-pay | Admitting: Family Medicine

## 2017-04-29 ENCOUNTER — Other Ambulatory Visit: Payer: Self-pay | Admitting: Cardiovascular Disease

## 2017-04-30 NOTE — Telephone Encounter (Signed)
Can we refill this medication.  

## 2017-05-01 ENCOUNTER — Telehealth: Payer: Self-pay | Admitting: Internal Medicine

## 2017-05-01 NOTE — Telephone Encounter (Signed)
I left a message on the patient's identified voice mail to hold this evening's dose of eliquis if he has not already taken it and hold the dose of eliquis in the morning prior to his procedure.

## 2017-05-02 ENCOUNTER — Encounter (HOSPITAL_COMMUNITY): Payer: Self-pay | Admitting: General Practice

## 2017-05-02 ENCOUNTER — Ambulatory Visit (HOSPITAL_COMMUNITY)
Admission: RE | Admit: 2017-05-02 | Discharge: 2017-05-03 | Disposition: A | Discharge status: Home / Self Care | Payer: Medicare Other | Source: Ambulatory Visit | Attending: Internal Medicine | Admitting: Internal Medicine

## 2017-05-02 ENCOUNTER — Encounter (HOSPITAL_COMMUNITY)
Admission: RE | Disposition: A | Discharge status: Home / Self Care | Payer: Self-pay | Source: Ambulatory Visit | Attending: Internal Medicine

## 2017-05-02 DIAGNOSIS — E039 Hypothyroidism, unspecified: Secondary | ICD-10-CM | POA: Insufficient documentation

## 2017-05-02 DIAGNOSIS — I447 Left bundle-branch block, unspecified: Secondary | ICD-10-CM | POA: Diagnosis not present

## 2017-05-02 DIAGNOSIS — Z6841 Body Mass Index (BMI) 40.0 and over, adult: Secondary | ICD-10-CM | POA: Insufficient documentation

## 2017-05-02 DIAGNOSIS — Z87891 Personal history of nicotine dependence: Secondary | ICD-10-CM | POA: Insufficient documentation

## 2017-05-02 DIAGNOSIS — Z7951 Long term (current) use of inhaled steroids: Secondary | ICD-10-CM | POA: Diagnosis not present

## 2017-05-02 DIAGNOSIS — Z882 Allergy status to sulfonamides status: Secondary | ICD-10-CM | POA: Insufficient documentation

## 2017-05-02 DIAGNOSIS — Z952 Presence of prosthetic heart valve: Secondary | ICD-10-CM | POA: Insufficient documentation

## 2017-05-02 DIAGNOSIS — N400 Enlarged prostate without lower urinary tract symptoms: Secondary | ICD-10-CM | POA: Insufficient documentation

## 2017-05-02 DIAGNOSIS — I2510 Atherosclerotic heart disease of native coronary artery without angina pectoris: Secondary | ICD-10-CM | POA: Diagnosis not present

## 2017-05-02 DIAGNOSIS — K219 Gastro-esophageal reflux disease without esophagitis: Secondary | ICD-10-CM | POA: Insufficient documentation

## 2017-05-02 DIAGNOSIS — I5042 Chronic combined systolic (congestive) and diastolic (congestive) heart failure: Secondary | ICD-10-CM | POA: Diagnosis not present

## 2017-05-02 DIAGNOSIS — H353130 Nonexudative age-related macular degeneration, bilateral, stage unspecified: Secondary | ICD-10-CM | POA: Insufficient documentation

## 2017-05-02 DIAGNOSIS — J45909 Unspecified asthma, uncomplicated: Secondary | ICD-10-CM | POA: Diagnosis not present

## 2017-05-02 DIAGNOSIS — E785 Hyperlipidemia, unspecified: Secondary | ICD-10-CM | POA: Insufficient documentation

## 2017-05-02 DIAGNOSIS — Z8249 Family history of ischemic heart disease and other diseases of the circulatory system: Secondary | ICD-10-CM | POA: Insufficient documentation

## 2017-05-02 DIAGNOSIS — Z951 Presence of aortocoronary bypass graft: Secondary | ICD-10-CM | POA: Insufficient documentation

## 2017-05-02 DIAGNOSIS — I472 Ventricular tachycardia: Secondary | ICD-10-CM | POA: Diagnosis not present

## 2017-05-02 DIAGNOSIS — E669 Obesity, unspecified: Secondary | ICD-10-CM | POA: Diagnosis not present

## 2017-05-02 DIAGNOSIS — I480 Paroxysmal atrial fibrillation: Secondary | ICD-10-CM | POA: Insufficient documentation

## 2017-05-02 DIAGNOSIS — I252 Old myocardial infarction: Secondary | ICD-10-CM | POA: Insufficient documentation

## 2017-05-02 DIAGNOSIS — Z959 Presence of cardiac and vascular implant and graft, unspecified: Secondary | ICD-10-CM

## 2017-05-02 DIAGNOSIS — Z7901 Long term (current) use of anticoagulants: Secondary | ICD-10-CM | POA: Diagnosis not present

## 2017-05-02 DIAGNOSIS — I495 Sick sinus syndrome: Secondary | ICD-10-CM | POA: Diagnosis not present

## 2017-05-02 DIAGNOSIS — M109 Gout, unspecified: Secondary | ICD-10-CM | POA: Insufficient documentation

## 2017-05-02 DIAGNOSIS — M1990 Unspecified osteoarthritis, unspecified site: Secondary | ICD-10-CM | POA: Diagnosis not present

## 2017-05-02 DIAGNOSIS — Z95810 Presence of automatic (implantable) cardiac defibrillator: Secondary | ICD-10-CM

## 2017-05-02 DIAGNOSIS — Z7982 Long term (current) use of aspirin: Secondary | ICD-10-CM | POA: Diagnosis not present

## 2017-05-02 DIAGNOSIS — I428 Other cardiomyopathies: Secondary | ICD-10-CM

## 2017-05-02 DIAGNOSIS — I110 Hypertensive heart disease with heart failure: Secondary | ICD-10-CM | POA: Insufficient documentation

## 2017-05-02 HISTORY — PX: BIV ICD INSERTION CRT-D: EP1195

## 2017-05-02 LAB — SURGICAL PCR SCREEN
MRSA, PCR: NEGATIVE
STAPHYLOCOCCUS AUREUS: NEGATIVE

## 2017-05-02 SURGERY — BIV ICD INSERTION CRT-D

## 2017-05-02 MED ORDER — ACETAMINOPHEN 325 MG PO TABS
325.0000 mg | ORAL_TABLET | ORAL | Status: DC | PRN
  Administered 2017-05-02 (×3): 650 mg via ORAL
  Filled 2017-05-02 (×3): qty 2

## 2017-05-02 MED ORDER — FLUTICASONE PROPIONATE 50 MCG/ACT NA SUSP
1.0000 | Freq: Two times a day (BID) | NASAL | Status: DC
  Administered 2017-05-02: 1 via NASAL
  Filled 2017-05-02: qty 16

## 2017-05-02 MED ORDER — LIDOCAINE HCL (PF) 1 % IJ SOLN
INTRAMUSCULAR | Status: AC
Start: 2017-05-02 — End: 2017-05-02
  Filled 2017-05-02: qty 60

## 2017-05-02 MED ORDER — ATORVASTATIN CALCIUM 20 MG PO TABS
20.0000 mg | ORAL_TABLET | Freq: Every day | ORAL | Status: DC
  Filled 2017-05-02: qty 1

## 2017-05-02 MED ORDER — MOMETASONE FURO-FORMOTEROL FUM 200-5 MCG/ACT IN AERO
2.0000 | INHALATION_SPRAY | Freq: Two times a day (BID) | RESPIRATORY_TRACT | Status: DC
  Administered 2017-05-02: 2 via RESPIRATORY_TRACT
  Filled 2017-05-02: qty 8.8

## 2017-05-02 MED ORDER — FENTANYL CITRATE (PF) 100 MCG/2ML IJ SOLN
INTRAMUSCULAR | Status: AC
  Filled 2017-05-02: qty 2

## 2017-05-02 MED ORDER — LOSARTAN POTASSIUM 25 MG PO TABS
25.0000 mg | ORAL_TABLET | Freq: Every day | ORAL | Status: DC
  Administered 2017-05-03: 25 mg via ORAL
  Filled 2017-05-02 (×2): qty 1

## 2017-05-02 MED ORDER — MUPIROCIN 2 % EX OINT
1.0000 "application " | TOPICAL_OINTMENT | Freq: Once | CUTANEOUS | Status: AC
  Administered 2017-05-02: 1 via TOPICAL
  Filled 2017-05-02: qty 22

## 2017-05-02 MED ORDER — LIDOCAINE HCL (PF) 1 % IJ SOLN
INTRAMUSCULAR | Status: DC | PRN
  Administered 2017-05-02: 65 mL

## 2017-05-02 MED ORDER — HEPARIN (PORCINE) IN NACL 2-0.9 UNIT/ML-% IJ SOLN
INTRAMUSCULAR | Status: AC | PRN
  Administered 2017-05-02: 500 mL

## 2017-05-02 MED ORDER — MIDAZOLAM HCL 5 MG/5ML IJ SOLN
INTRAMUSCULAR | Status: AC
  Filled 2017-05-02: qty 5

## 2017-05-02 MED ORDER — CEFAZOLIN SODIUM-DEXTROSE 2-4 GM/100ML-% IV SOLN
2.0000 g | INTRAVENOUS | Status: AC
  Administered 2017-05-02: 2 g via INTRAVENOUS
  Filled 2017-05-02: qty 100

## 2017-05-02 MED ORDER — ONDANSETRON HCL 4 MG/2ML IJ SOLN: 4.0000 mg | Freq: Four times a day (QID) | INTRAMUSCULAR | Status: DC | PRN

## 2017-05-02 MED ORDER — FUROSEMIDE 80 MG PO TABS
80.0000 mg | ORAL_TABLET | Freq: Every day | ORAL | Status: DC
  Administered 2017-05-02 – 2017-05-03 (×2): 80 mg via ORAL
  Filled 2017-05-02 (×2): qty 1

## 2017-05-02 MED ORDER — MONTELUKAST SODIUM 10 MG PO TABS
10.0000 mg | ORAL_TABLET | Freq: Every evening | ORAL | Status: DC
  Administered 2017-05-02: 10 mg via ORAL
  Filled 2017-05-02: qty 1

## 2017-05-02 MED ORDER — LEVOTHYROXINE SODIUM 75 MCG PO TABS
75.0000 ug | ORAL_TABLET | Freq: Every day | ORAL | Status: DC
  Administered 2017-05-02: 75 ug via ORAL
  Filled 2017-05-02 (×2): qty 1

## 2017-05-02 MED ORDER — CEFAZOLIN SODIUM-DEXTROSE 1-4 GM/50ML-% IV SOLN
1.0000 g | Freq: Four times a day (QID) | INTRAVENOUS | Status: AC
  Administered 2017-05-02 – 2017-05-03 (×3): 1 g via INTRAVENOUS
  Filled 2017-05-02 (×3): qty 50

## 2017-05-02 MED ORDER — SODIUM CHLORIDE 0.45 % IV SOLN: INTRAVENOUS | Status: DC

## 2017-05-02 MED ORDER — MUPIROCIN 2 % EX OINT
TOPICAL_OINTMENT | CUTANEOUS | Status: AC
  Administered 2017-05-02: 1 via TOPICAL
  Filled 2017-05-02: qty 22

## 2017-05-02 MED ORDER — ATORVASTATIN CALCIUM 20 MG PO TABS
20.0000 mg | ORAL_TABLET | Freq: Every day | ORAL | Status: DC
  Administered 2017-05-02: 20 mg via ORAL
  Filled 2017-05-02: qty 1

## 2017-05-02 MED ORDER — METOPROLOL TARTRATE 25 MG PO TABS
25.0000 mg | ORAL_TABLET | Freq: Two times a day (BID) | ORAL | Status: DC
  Administered 2017-05-02 – 2017-05-03 (×2): 25 mg via ORAL
  Filled 2017-05-02 (×3): qty 1

## 2017-05-02 MED ORDER — GENTAMICIN SULFATE 40 MG/ML IJ SOLN
80.0000 mg | INTRAMUSCULAR | Status: AC
  Administered 2017-05-02: 80 mg
  Filled 2017-05-02: qty 2

## 2017-05-02 MED ORDER — ASPIRIN EC 81 MG PO TBEC
81.0000 mg | DELAYED_RELEASE_TABLET | Freq: Every day | ORAL | Status: DC
  Administered 2017-05-03: 81 mg via ORAL
  Filled 2017-05-02 (×2): qty 1

## 2017-05-02 MED ORDER — FENTANYL CITRATE (PF) 100 MCG/2ML IJ SOLN
INTRAMUSCULAR | Status: DC | PRN
  Administered 2017-05-02 (×7): 25 ug via INTRAVENOUS

## 2017-05-02 MED ORDER — LIDOCAINE HCL (PF) 1 % IJ SOLN
INTRAMUSCULAR | Status: AC
  Filled 2017-05-02: qty 30

## 2017-05-02 MED ORDER — MIDAZOLAM HCL 5 MG/5ML IJ SOLN
INTRAMUSCULAR | Status: AC
Start: 2017-05-02 — End: 2017-05-02
  Filled 2017-05-02: qty 5

## 2017-05-02 MED ORDER — SODIUM CHLORIDE 0.9 % IV SOLN: INTRAVENOUS | Status: AC

## 2017-05-02 MED ORDER — CHLORHEXIDINE GLUCONATE 4 % EX LIQD
60.0000 mL | Freq: Once | CUTANEOUS | Status: DC
  Filled 2017-05-02: qty 60

## 2017-05-02 MED ORDER — MIDAZOLAM HCL 5 MG/5ML IJ SOLN
INTRAMUSCULAR | Status: DC | PRN
  Administered 2017-05-02 (×4): 1 mg via INTRAVENOUS
  Administered 2017-05-02: 2 mg via INTRAVENOUS
  Administered 2017-05-02 (×3): 1 mg via INTRAVENOUS

## 2017-05-02 MED ORDER — ISOSORBIDE MONONITRATE ER 30 MG PO TB24
30.0000 mg | ORAL_TABLET | Freq: Every day | ORAL | Status: DC
  Administered 2017-05-03: 30 mg via ORAL
  Filled 2017-05-02 (×2): qty 1

## 2017-05-02 MED ORDER — CEFAZOLIN SODIUM-DEXTROSE 2-4 GM/100ML-% IV SOLN
INTRAVENOUS | Status: AC
  Filled 2017-05-02: qty 100

## 2017-05-02 MED ORDER — IOPAMIDOL (ISOVUE-370) INJECTION 76%
INTRAVENOUS | Status: AC
Start: 2017-05-02 — End: 2017-05-02
  Filled 2017-05-02: qty 50

## 2017-05-02 MED ORDER — IOPAMIDOL (ISOVUE-370) INJECTION 76%
INTRAVENOUS | Status: DC | PRN
  Administered 2017-05-02: 10 mL via INTRAVENOUS

## 2017-05-02 MED ORDER — SODIUM CHLORIDE 0.9 % IR SOLN
Status: AC
  Filled 2017-05-02: qty 2

## 2017-05-02 MED ORDER — SODIUM CHLORIDE 0.9 % IV SOLN
INTRAVENOUS | Status: DC
  Administered 2017-05-02: 07:00:00 via INTRAVENOUS

## 2017-05-02 MED ORDER — APIXABAN 5 MG PO TABS
5.0000 mg | ORAL_TABLET | Freq: Two times a day (BID) | ORAL | Status: DC
  Administered 2017-05-02 – 2017-05-03 (×2): 5 mg via ORAL
  Filled 2017-05-02 (×3): qty 1

## 2017-05-02 MED ORDER — ATORVASTATIN CALCIUM 20 MG PO TABS: 20.0000 mg | ORAL_TABLET | Freq: Every day | ORAL | Status: DC

## 2017-05-02 SURGICAL SUPPLY — 16 items
CABLE SURGICAL S-101-97-12 (CABLE) ×3 IMPLANT
CATH CPS DIRECT 135 DS2C020 (CATHETERS) ×3 IMPLANT
HEMOSTAT SURGICEL 2X4 FIBR (HEMOSTASIS) ×3 IMPLANT
ICD CLARIA MRI DTMA1D4 (ICD Generator) ×3 IMPLANT
KIT ESSENTIALS PG (KITS) ×3 IMPLANT
LEAD ATTAIN ABILITY 4396-88CM (Lead) ×3 IMPLANT
LEAD CAPSURE NOVUS 5076-52CM (Lead) ×3 IMPLANT
LEAD SPRINT QUAT SEC 6935M-62 (Lead) ×3 IMPLANT
PAD DEFIB LIFELINK (PAD) ×3 IMPLANT
SHEATH CLASSIC 7F (SHEATH) ×3 IMPLANT
SHEATH CLASSIC 9.5F (SHEATH) ×3 IMPLANT
SHEATH CLASSIC 9F (SHEATH) ×3 IMPLANT
SLITTER UNIVERSAL DS2A003 (MISCELLANEOUS) ×3 IMPLANT
TRAY PACEMAKER INSERTION (PACKS) ×3 IMPLANT
WIRE ACUITY WHISPER EDS 4648 (WIRE) ×3 IMPLANT
WIRE HI TORQ VERSACORE-J 145CM (WIRE) ×3 IMPLANT

## 2017-05-02 NOTE — Interval H&P Note (Signed)
History and Physical Interval Note:  05/02/2017 7:28 AM  Justin [name]  has presented today for surgery, with the diagnosis of VT  The various methods of treatment have been discussed with the patient and family. After consideration of risks, benefits and other options for treatment, the patient has consented to  Procedure(s): ICD Implant (N/A) as a surgical intervention .  The patient's history has been reviewed, patient examined, no change in status, stable for surgery.  I have reviewed the patient's chart and labs.  Questions were answered to the patient's satisfaction.     Virl Axe

## 2017-05-02 NOTE — Progress Notes (Signed)
Nutrition Brief Note  Patient identified on the Malnutrition Screening Tool (MST) Report  Wt Readings from Last 15 Encounters:  05/02/17 260 lb (117.9 kg)  04/08/17 262 lb (118.8 kg)  04/04/17 261 lb (118.4 kg)  03/19/17 261 lb 12.8 oz (118.8 kg)  03/18/17 264 lb (119.7 kg)  03/10/17 265 lb (120.2 kg)  03/07/17 265 lb 14.4 oz (120.6 kg)  03/03/17 268 lb (121.6 kg)  02/27/17 267 lb 4.8 oz (121.2 kg)  02/04/17 265 lb 9.6 oz (120.5 kg)  01/31/17 270 lb (122.5 kg)  01/17/17 275 lb (124.7 kg)  01/15/17 272 lb 6.4 oz (123.6 kg)  01/15/17 273 lb 6.4 oz (124 kg)  01/10/17 274 lb (124.3 kg)    Body mass index is 40.72 kg/m. Patient meets criteria for morbid obesity based on current BMI. Pt reports no changes in appetite or intakes and states that he has been losing weight since being started on Lasix. Pt with hx of CAD with bypass in 2007. He developed aortic stenosis and underwent TAVR in July which was complicated by post-op Afib. He was admitted for surgery (ICD implant) today d/t v-tach.  Current diet order is 2 gram Na. Medications reviewed; 80 mg oral Lasix/day, 75 mcg oral Synthroid/day.  Labs reviewed; creatinine: 0.79 mg/dL. IVF: NS @ 50 mL/hr.   No nutrition interventions warranted at this time. If nutrition issues arise, please consult RD.     Jarome Matin, MS, RD, LDN, [facility] Inpatient Clinical Dietitian Pager # [phone] After hours/weekend pager # [phone]

## 2017-05-02 NOTE — H&P (View-Only) (Signed)
ELECTROPHYSIOLOGY CONSULT NOTE  Patient ID: Justin [name], MRN: [id number], DOB/AGE: 81-Sep-1935 81 y.o. Admit date: (Not on file) Date of Consult: 04/08/2017  Primary Physician: Laurey Morale, MD Primary Cardiologist: C[facility]     Justin [name] is a 83 y.o. male who is being seen today for the evaluation of abnormal monitor at the request of Cmac.    HPI Justin [name] is a 83 y.o. male  Referred because of an abnormal Holter monitor.  He has a history of coronary artery disease with prior bypass surgery in 2007 and developed aortic stenosis and underwent TAVR 7/18. He was complicated by postoperative atrial fibrillation. Treated with anticoagulation and amiodarone  He had an abnormal ECG following his procedure that demonstrated group beating and a higher V a rate. I initially thought maybe it was 1:2 conduction over fast pathway and slow pathway based on subsequent monitoring, I suspect it is junctional rhythm. He has had episodes of abrupt presyncope which are rapidly terminating with a total duration being just a few seconds. He has not had tachycardia palpitations. He has not had syncope.  Following TAVR, his shortness of breath which has been very limiting is markedly attenuated he's had significant edema which is improved. He has eliminated sodium from his diet. His volume intake is decreasing.   Holter was personally reviewed. It demonstrated frequent junctional beats which are accelerated resulting in AV dissociation as well as PVCs.  He also had nonsustained ventricular tachycardia lasting about 15 seconds   Catheterization 1/18 demonstrated patent grafts. Echocardiogram EF 40-45%   Past Medical History:  Diagnosis Date  . Age-related macular degeneration, dry, both eyes   . Allergic    "24/7; 365 days/year; I'm allergic to pollens, dust, all southern grasses/trees, mold, mildue, cats, dogs" (01/07/2017)  . Asthma    sees [name]   . Benign prostatic  hypertrophy    (sees Dr. Denman George  . CAD (coronary artery disease)    a. s/p CABG 2007. b. Cath 08/2016 - 4/4 patent grafts.  . Carotid bruit    carotid u/s 10/10: 0.39% bilaterally  . Chronic combined systolic and diastolic CHF (congestive heart failure) ([place])   . Complication of anesthesia 1980s   "w/anal cyst OR, he gave me a saddle block then put a narcotic in spinal cord; had a severe reaction to that" (01/07/2017)  . ED (erectile dysfunction)   . Family history of adverse reaction to anesthesia    "daughter wakes up during OR" (01/07/2017)  . GERD (gastroesophageal reflux disease)   . Gout   . HTN (hypertension)   . Hx of colonic polyps    (sees Dr. Henrene Pastor)  . Hyperlipidemia   . Hypothyroidism   . Moderate to severe aortic stenosis    a. s/p TAVR 02/2017.  [name] Myocardial infarction ([place]) ~ 2000  . Obesity   . Osteoarthritis    "was in my knees, hands" (01/07/2017 )  . PAF (paroxysmal atrial fibrillation) ([place])    a. documented post TAVR.  [name] Precancerous skin lesion    (sees Dr. Allyson Sabal)  . Prostate cancer ([place]) dx'd ~ 2014  . 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 ANUS SURGERY     "opened it back up cause  it wouldn't heal; wound up w/a fissure" (01/07/2017)  . CARDIAC CATHETERIZATION  10/29/2005  . CARDIAC CATHETERIZATION N/A 08/27/2016   Procedure: Right/Left Heart Cath and Coronary/Graft Angiography;  Surgeon: Burnell Blanks, MD;  Location: [facility];  Service: Cardiovascular;  Laterality: N/A;  . CATARACT EXTRACTION W/ INTRAOCULAR LENS  IMPLANT, BILATERAL Bilateral   . COLONOSCOPY  06/30/2008   no repeats needed   . CORONARY ARTERY BYPASS GRAFT  2007   "CABG X4"  . CYST EXCISION PERINEAL  1980s  . HAMMER TOE SURGERY Bilateral   .  JOINT REPLACEMENT    . KNEE ARTHROPLASTY  07/30/2011   Procedure: COMPUTER ASSISTED TOTAL KNEE ARTHROPLASTY;  Surgeon: Meredith Pel;  Location: [facility];  Service: Orthopedics;  Laterality: Left;  left total knee arthroplasty  . MASTECTOMY SUBCUTANEOUS Bilateral   . ORIF FINGER / THUMB FRACTURE Right ~ 1980   "repair of thumb injury"  . PROSTATE BIOPSY    . REPLACEMENT TOTAL KNEE BILATERAL Bilateral 2012  . TEE WITHOUT CARDIOVERSION N/A 03/04/2017   Procedure: TRANSESOPHAGEAL ECHOCARDIOGRAM (TEE);  Surgeon: Burnell Blanks, MD;  Location: [facility];  Service: Open Heart Surgery;  Laterality: N/A;  . TRANSCATHETER AORTIC VALVE REPLACEMENT, TRANSFEMORAL N/A 03/04/2017   Procedure: TRANSCATHETER AORTIC VALVE REPLACEMENT, TRANSFEMORAL;  Surgeon: Burnell Blanks, MD;  Location: [facility];  Service: Open Heart Surgery;  Laterality: N/A;     Home Meds: Prior to Admission medications   Medication Sig Start Date End Date Taking? Authorizing Provider  apixaban (ELIQUIS) 5 MG TABS tablet Take 1 tablet (5 mg total) by mouth 2 (two) times daily. 04/01/17  Yes Burnell Blanks, MD  aspirin 81 MG tablet Take 1 tablet (81 mg total) by mouth daily. 03/07/17  Yes Reino Bellis B, NP  atorvastatin (LIPITOR) 20 MG tablet Take 20 mg by mouth daily.   Yes [provider]  Azelastine-Fluticasone (DYMISTA) 137-50 MCG/ACT SUSP Place 2 puffs into the nose 2 (two) times daily. 12/02/16  Yes Juanito Doom, MD  benzonatate (TESSALON) 200 MG capsule Take 1 capsule (200 mg total) by mouth 2 (two) times daily as needed for cough. 01/30/17  Yes Laurey Morale, MD  budesonide-formoterol [facility]) 160-4.5 MCG/ACT inhaler Inhale 2 puffs into the lungs 2 (two) times daily.   Yes [provider]  cetirizine (ZYRTEC) 10 MG tablet Take 10 mg by mouth daily.   Yes [provider]  chlorpheniramine (CHLOR-TRIMETON) 4 MG tablet Take 2 tablets by mouth (8 mg total) two times daily   Yes  [provider]  furosemide (LASIX) 80 MG tablet Take 1 tablet (80 mg total) by mouth daily. 01/10/17  Yes Daune Perch, NP  isosorbide mononitrate (IMDUR) 30 MG 24 hr tablet Take 1 tablet (30 mg total) by mouth daily. 09/17/16  Yes Laurey Morale, MD  levothyroxine (SYNTHROID, LEVOTHROID) 75 MCG tablet Take 1 tablet (75 mcg total) by mouth daily. 09/30/16  Yes Laurey Morale, MD  losartan (COZAAR) 25 MG tablet Take 1 tablet (25 mg total) by mouth daily. 01/11/17  Yes Daune Perch, NP  metoprolol tartrate (LOPRESSOR) 25 MG tablet Take 1 tablet (25 mg total) by mouth 2 (two) times daily. 03/19/17 06/17/17 Yes Dunn, Dayna N, PA-C  montelukast (SINGULAIR) 10 MG tablet TAKE 1 TABLET IN THE EVENING. 01/06/17  Yes McQuaid, Ronie Spies, MD  Multiple Vitamins-Minerals (PRESERVISION AREDS PO) Take 2 tablets by mouth every morning.   Yes [provider]  potassium chloride SA (K-DUR,KLOR-CON)  20 MEQ tablet TAKE (2) TABLETS DAILY. 03/20/17  Yes Dunn, Dayna N, PA-C  ranitidine (ZANTAC) 150 MG capsule Take 150 mg by mouth 2 (two) times daily.   Yes [provider]    Allergies:  Allergies  Allergen Reactions  . Peanut-Containing Drug Products Anaphylaxis  . Sulfonamide Derivatives Anaphylaxis  . Amlodipine Swelling    Swelling in ankles  . Lisinopril Cough    cough  . Xarelto [Rivaroxaban] Other (See Comments)    Back pain    Social History   Social History  . Marital status: Married    Spouse name: N/A  . Number of children: N/A  . Years of education: N/A   Occupational History  . general contractor     builds malls   Social History Main Topics  . Smoking status: Former Smoker    Packs/day: 3.50    Years: 13.00    Types: Cigarettes    Quit date: 1963  . Smokeless tobacco: Never Used  . Alcohol use 0.0 oz/week     Comment: 01/07/2017 "might have 2 beers 2-3 times/month"  . Drug use: No  . Sexual activity: No   Other Topics Concern  . Not on file   Social  History Narrative   FH of CAD, Male 1st degree relative less than age 89.     Family History  Problem Relation Age of Onset  . Heart attack Father 101  . Heart failure Mother 21  . Uterine cancer Mother      ROS:  Please see the history of present illness.     All other systems reviewed and negative.    Physical Exam: Blood pressure 132/68, pulse 81, height _0  (1.702 m), weight 262 lb (118.8 kg), SpO2 98 %. General: Well developed, well nourished male in no acute distress. Head: Normocephalic, atraumatic, sclera non-icteric, no xanthomas, nares are without discharge. EENT: normal  Lymph Nodes:  none Neck: Negative for carotid bruits. JVD 8 Back:without scoliosis kyphosis  Lungs: Clear bilaterally to auscultation without wheezes, rales, or rhonchi. Breathing is unlabored. Heart: RRR with S1 S2. N 2/6 systolic murmur . No rubs, or gallops appreciated. Abdomen: Soft, non-tender, non-distended with normoactive bowel sounds. No hepatomegaly. No rebound/guarding. No obvious abdominal masses. Msk:  Strength and tone appear normal for age. Extremities: No clubbing or cyanosis. 2+ edema.  Distal pedal pulses are 2+ and equal bilaterally. Skin: Warm and Dry Neuro: Alert and oriented X 3. CN III-XII intact Grossly normal sensory and motor function . Psych:  Responds to questions appropriately with a normal affect.      Labs: Cardiac Enzymes No results for input(s): CKTOTAL, CKMB, TROPONINI in the last 72 hours. CBC Lab Results  Component Value Date   WBC 14.5 (H) 03/19/2017   HGB 14.1 03/19/2017   HCT 43.7 03/19/2017   MCV 86 03/19/2017   PLT 312 03/19/2017   PROTIME: No results for input(s): LABPROT, INR in the last 72 hours. Chemistry No results for input(s): NA, K, CL, CO2, BUN, CREATININE, CALCIUM, PROT, BILITOT, ALKPHOS, ALT, AST, GLUCOSE in the last 168 hours.  Invalid input(s): LABALBU Lipids Lab Results  Component Value Date   CHOL 109 09/25/2016   HDL 29.80 (L)  09/25/2016   LDLCALC 53 09/25/2016   TRIG 133.0 09/25/2016   BNP No results found for: PROBNP Thyroid Function Tests: No results for input(s): TSH, T4TOTAL, T3FREE, THYROIDAB in the last 72 hours.  Invalid input(s): FREET3 Miscellaneous No results found for: DDIMER  Radiology/Studies:  Xr Knee 1-2 Views Left  Result Date: 03/20/2017 AP lateral merchant left knee reviewed.  Total knee prosthesis in good position and alignment with no evidence of loosening or effusion.  Xr Knee 3 View Right  Result Date: 03/20/2017 AP lateral merchant right knee reviewed.  Total knee prosthesis in good position and alignment.  No fracture.  No evidence of loosening or effusion.   EKG:  15 May sinus rhythm at 65 with frequent ventricular ectopy and evidence of sinus node dysfunction with sinus arrest. Intervals 24/10/45   25 July wide-complex rhythm with a left bundle branch configuration lead V1 and nonspecific IVCD configuration elsewhere QRS duration 140 ms sinus bradycardia with group beating with presumably a junctional origin  10 August sinus bradycardia with a junctional escape rhythm again with a left bundle branch block morphology in the anterior precordium and nonspecific morphology in the lateral leads there is also frequent ventricular ectopy   Assessment and Plan:  Ventricular tachycardia- prolonged- nonsustained  Sinus node dysfunction  Junctional rhythm  Post TAVR left bundle branch block/IVCD  Cardiomyopathy EF 40-45%   The patient has significant arrhythmias that will predate and postdate his TAVR. He has postprocedural left bundle branch block/IVCD for which there is a literature outlining a higher mortality, higher incidence of pacing as well as a prolonged monitoring study we are in bradycardia arrhythmias and tach arrhythmias were identified and for which devices were subsequently implanted.  He's had recurrent presyncope. The brevity of these events suggest a rhythmic  cause which could either be bradycardia or tachycardia  With his cardiomyopathy is ventricular tachycardia is worrisome.  I've discussed this with Dr. Kendra Opitz was inclined towards device therapy as opposed diagnostics  More than 50% of 95 min was spent in counseling related to the above    Virl Axe

## 2017-05-03 ENCOUNTER — Ambulatory Visit (HOSPITAL_COMMUNITY): Payer: Medicare Other

## 2017-05-03 ENCOUNTER — Encounter (HOSPITAL_COMMUNITY): Payer: Self-pay

## 2017-05-03 DIAGNOSIS — I252 Old myocardial infarction: Secondary | ICD-10-CM | POA: Diagnosis not present

## 2017-05-03 DIAGNOSIS — I5042 Chronic combined systolic (congestive) and diastolic (congestive) heart failure: Secondary | ICD-10-CM | POA: Diagnosis not present

## 2017-05-03 DIAGNOSIS — E039 Hypothyroidism, unspecified: Secondary | ICD-10-CM | POA: Diagnosis not present

## 2017-05-03 DIAGNOSIS — H353130 Nonexudative age-related macular degeneration, bilateral, stage unspecified: Secondary | ICD-10-CM | POA: Diagnosis not present

## 2017-05-03 DIAGNOSIS — I447 Left bundle-branch block, unspecified: Secondary | ICD-10-CM | POA: Diagnosis not present

## 2017-05-03 DIAGNOSIS — N400 Enlarged prostate without lower urinary tract symptoms: Secondary | ICD-10-CM | POA: Diagnosis not present

## 2017-05-03 DIAGNOSIS — M1990 Unspecified osteoarthritis, unspecified site: Secondary | ICD-10-CM | POA: Diagnosis not present

## 2017-05-03 DIAGNOSIS — Z95810 Presence of automatic (implantable) cardiac defibrillator: Secondary | ICD-10-CM

## 2017-05-03 DIAGNOSIS — K219 Gastro-esophageal reflux disease without esophagitis: Secondary | ICD-10-CM | POA: Diagnosis not present

## 2017-05-03 DIAGNOSIS — E785 Hyperlipidemia, unspecified: Secondary | ICD-10-CM | POA: Diagnosis not present

## 2017-05-03 DIAGNOSIS — I428 Other cardiomyopathies: Secondary | ICD-10-CM

## 2017-05-03 DIAGNOSIS — I472 Ventricular tachycardia: Secondary | ICD-10-CM

## 2017-05-03 DIAGNOSIS — I110 Hypertensive heart disease with heart failure: Secondary | ICD-10-CM | POA: Diagnosis not present

## 2017-05-03 DIAGNOSIS — I2510 Atherosclerotic heart disease of native coronary artery without angina pectoris: Secondary | ICD-10-CM | POA: Diagnosis not present

## 2017-05-03 DIAGNOSIS — Z7951 Long term (current) use of inhaled steroids: Secondary | ICD-10-CM | POA: Diagnosis not present

## 2017-05-03 DIAGNOSIS — I495 Sick sinus syndrome: Secondary | ICD-10-CM | POA: Diagnosis not present

## 2017-05-03 DIAGNOSIS — Z952 Presence of prosthetic heart valve: Secondary | ICD-10-CM | POA: Diagnosis not present

## 2017-05-03 DIAGNOSIS — Z950 Presence of cardiac pacemaker: Secondary | ICD-10-CM | POA: Diagnosis not present

## 2017-05-03 DIAGNOSIS — Z7982 Long term (current) use of aspirin: Secondary | ICD-10-CM | POA: Diagnosis not present

## 2017-05-03 DIAGNOSIS — Z951 Presence of aortocoronary bypass graft: Secondary | ICD-10-CM | POA: Diagnosis not present

## 2017-05-03 DIAGNOSIS — M109 Gout, unspecified: Secondary | ICD-10-CM | POA: Diagnosis not present

## 2017-05-03 DIAGNOSIS — Z7901 Long term (current) use of anticoagulants: Secondary | ICD-10-CM | POA: Diagnosis not present

## 2017-05-03 DIAGNOSIS — I480 Paroxysmal atrial fibrillation: Secondary | ICD-10-CM | POA: Diagnosis not present

## 2017-05-03 DIAGNOSIS — J45909 Unspecified asthma, uncomplicated: Secondary | ICD-10-CM | POA: Diagnosis not present

## 2017-05-03 NOTE — Discharge Summary (Signed)
Discharge Summary    Patient ID: Justin [name],  MRN: [id number], DOB/AGE: 1933-12-07 83 y.o.  Admit date: 05/02/2017 Discharge date: 05/03/2017  Primary Care Provider: Laurey Morale Primary Cardiologist: Dr. Angelena Form EP: Dr. [name]   Discharge Diagnoses    Active Problems:   NICM (nonischemic cardiomyopathy) ([place])   S/P ICD (internal cardiac defibrillator) procedure   Allergies Allergies  Allergen Reactions  . Peanut-Containing Drug Products Anaphylaxis  . Sulfonamide Derivatives Anaphylaxis  . Amlodipine Swelling    Swelling in ankles  . Lisinopril Cough    cough  . Xarelto [Rivaroxaban] Other (See Comments)    Back pain    Diagnostic Studies/Procedures    ICD implantation (Medtronic) 05/02/17  (See CV procedural Note for full details)   History of Present Illness     83 y/o male with a history of coronary artery disease with prior bypass surgery in 2007 and developed aortic stenosis and underwent TAVR 7/18. He was complicated by postoperative atrial fibrillation. Treated with anticoagulation and amiodarone. EF mildly reduced at 40-45%. LHC 08/2016 showed patient grafts.   He had an abnormal ECG following his TAVR that demonstrated group beating and a higher Vrate. He has had episodes of abrupt presyncope which are rapidly terminating with a total duration being just a few seconds. He has not had palpitations. He has not had syncope. He was referred to Dr. [name] and Holter monitor was placed showing frequent junctional beats which are accelerated resulting in AV dissociation as well as PVCs.  He also had nonsustained ventricular tachycardia lasting about 15 seconds. Based on this, decision was made to implant on ICD for VT/ primary prevention of SCD.   Hospital Course     Pt [facility] to [facility]17 for planned procedure, performed by Dr. [name]. He underwent successful implantation of a Medtronic ICD. He was monitored overnight and had no postop complications. No  CP or dyspnea. CXR showed proper lead placement and no evidence of pneumothorax. Device interrogation revealed normal functioning. Device pocket was stable. VSS. He was last seen and examined by Dr. Rayann Heman, who determined he was stable for discharge home. He was given wound care instructions and activity restrictions. Wound check f/u has been arranged in the device clinic on 05/14/17. His home meds were resumed. No changes.   Consultants: none    Discharge Vitals Blood pressure 131/77, pulse 75, temperature 97.7 F (36.5 C), temperature source Oral, resp. rate 19, height 5\' 7"  (1.702 m), weight 263 lb 11.2 oz (119.6 kg), SpO2 97 %.  Filed Weights   05/02/17 0551 05/03/17 0500  Weight: 260 lb (117.9 kg) 263 lb 11.2 oz (119.6 kg)   Physical Exam  Constitutional: He is oriented to person, place, and time. He appears well-developed and well-nourished. No distress.  HENT:  Head: Normocephalic and atraumatic.  Eyes: Pupils are equal, round, and reactive to light. Conjunctivae and EOM are normal.  Neck: Normal range of motion. Neck supple. No JVD present. No thyromegaly present.  Cardiovascular: Normal rate and regular rhythm.  Exam reveals friction rub. Exam reveals no gallop.   No murmur heard. Pulmonary/Chest: Effort normal and breath sounds normal. No respiratory distress. He has no wheezes. He has no rales.  Abdominal: Soft. Bowel sounds are normal. He exhibits no distension. There is no tenderness.  Musculoskeletal: Normal range of motion. He exhibits no edema or deformity.  Lymphadenopathy:    He has no cervical adenopathy.  Neurological: He is alert and oriented to person,  place, and time.  Skin: Skin is warm and dry. No erythema.  Psychiatric: He has a normal mood and affect. His behavior is normal. Judgment and thought content normal.   Labs & Radiologic Studies    CBC No results for input(s): WBC, NEUTROABS, HGB, HCT, MCV, PLT in the last 72 hours. Basic Metabolic Panel No  results for input(s): NA, K, CL, CO2, GLUCOSE, BUN, CREATININE, CALCIUM, MG, PHOS in the last 72 hours. Liver Function Tests No results for input(s): AST, ALT, ALKPHOS, BILITOT, PROT, ALBUMIN in the last 72 hours. No results for input(s): LIPASE, AMYLASE in the last 72 hours. Cardiac Enzymes No results for input(s): CKTOTAL, CKMB, CKMBINDEX, TROPONINI in the last 72 hours. BNP Invalid input(s): POCBNP D-Dimer No results for input(s): DDIMER in the last 72 hours. Hemoglobin A1C No results for input(s): HGBA1C in the last 72 hours. Fasting Lipid Panel No results for input(s): CHOL, HDL, LDLCALC, TRIG, CHOLHDL, LDLDIRECT in the last 72 hours. Thyroid Function Tests No results for input(s): TSH, T4TOTAL, T3FREE, THYROIDAB in the last 72 hours.  Invalid input(s): FREET3 _____________  Dg Chest 2 View  Result Date: 05/03/2017 CLINICAL DATA:  Cardiac device placement EXAM: CHEST  2 VIEW COMPARISON:  March 05, 2017 FINDINGS: A 3 lead cardiac device has been place with tips projected over the right atrium and right ventricle. The third lead projects over the posterior heart on the lateral view. Cardiomegaly. The hila and mediastinum are normal. No pneumothorax. No nodules or masses. IMPRESSION: 1. Three lead AICD device/pacemaker placement as above. No pneumothorax. Electronically Signed   By: Dorise Bullion III M.D   On: 05/03/2017 08:46   Disposition   Pt is being discharged home today in good condition.  Follow-up Plans & Appointments    Follow-up Information    [facility] Follow up on 05/14/2017.   Specialty:  Cardiology Why:  9:00 AM for wound check  Contact information: [address] [place] [phone]         Discharge Instructions    Diet - low sodium heart healthy    Complete by:  As directed    Increase activity slowly    Complete by:  As directed       Discharge Medications   Current Discharge  Medication List    CONTINUE these medications which have NOT CHANGED   Details  aspirin 81 MG tablet Take 1 tablet (81 mg total) by mouth daily. Qty: 30 tablet, Refills: 0    atorvastatin (LIPITOR) 20 MG tablet TAKE 1 TABLET ONCE DAILY. Qty: 90 tablet, Refills: 3    Azelastine-Fluticasone (DYMISTA) 137-50 MCG/ACT SUSP Place 2 puffs into the nose 2 (two) times daily. Qty: 1 Bottle, Refills: 6    benzonatate (TESSALON) 200 MG capsule Take 1 capsule (200 mg total) by mouth 2 (two) times daily as needed for cough. Qty: 60 capsule, Refills: 5    budesonide-formoterol (SYMBICORT) 160-4.5 MCG/ACT inhaler Inhale 2 puffs into the lungs 2 (two) times daily.    cetirizine (ZYRTEC) 10 MG tablet Take 10 mg by mouth daily.    chlorpheniramine (CHLOR-TRIMETON) 4 MG tablet Take 8 mg by mouth 2 (two) times daily.     ELIQUIS 5 MG TABS tablet TAKE 1 TABLET TWICE DAILY. Qty: 60 tablet, Refills: 10    furosemide (LASIX) 80 MG tablet Take 1 tablet (80 mg total) by mouth daily. Qty: 90 tablet, Refills: 3    isosorbide mononitrate (IMDUR) 30  MG 24 hr tablet Take 1 tablet (30 mg total) by mouth daily. Qty: 90 tablet, Refills: 3    Ketotifen Fumarate (ZADITOR OP) Apply 1 drop to eye daily as needed (dry eyes).    levothyroxine (SYNTHROID, LEVOTHROID) 75 MCG tablet Take 1 tablet (75 mcg total) by mouth daily. Qty: 90 tablet, Refills: 3    losartan (COZAAR) 25 MG tablet Take 1 tablet (25 mg total) by mouth daily. Qty: 30 tablet, Refills: 5    metoprolol tartrate (LOPRESSOR) 25 MG tablet Take 1 tablet (25 mg total) by mouth 2 (two) times daily. Qty: 180 tablet, Refills: 3    montelukast (SINGULAIR) 10 MG tablet TAKE 1 TABLET IN THE EVENING. Qty: 60 tablet, Refills: 5    Multiple Vitamins-Minerals (PRESERVISION AREDS PO) Take 2 tablets by mouth every morning.    potassium chloride SA (K-DUR,KLOR-CON) 20 MEQ tablet TAKE (2) TABLETS DAILY. Qty: 180 tablet, Refills: 3    ranitidine (ZANTAC) 150 MG  capsule Take 150 mg by mouth every evening.     triamcinolone cream (KENALOG) 0.1 % Apply 1 application topically daily as needed for dry skin.           Outstanding Labs/Studies   None   Duration of Discharge Encounter   Greater than 30 minutes including physician time.  Signed, Lyda Jester PA-C 05/03/2017, 10:06 AM  Thompson Grayer MD, [facility] 05/03/2017 10:45 PM

## 2017-05-03 NOTE — Plan of Care (Signed)
Problem: Cardiac: Goal: Ability to achieve and maintain adequate cardiopulmonary perfusion will improve Outcome: Progressing Site is clean dry and intact. Pt educated and compliant with prescribed extremity restriction.  Problem: Education: Goal: Knowledge of cardiac device and self-care will improve Outcome: Progressing Discussed purpose/function of cardiac device with pt. Pt verbalized a clearer understanding.

## 2017-05-03 NOTE — Care Management Note (Signed)
Case Management Note  Patient Details  Name: Justin [name] MRN: [id number] Date of Birth: [birth year]  Subjective/Objective:                 Patient with order to DC to home today. Chart reviewed. No Home Health or Equipment needs, no unacknowledged Case Management consults or medication needs identified at the time of this note. Plan for DC to home. If needs arise today prior to discharge, please call Carles Collet RN CM at [phone].    Action/Plan:   Expected Discharge Date:  05/03/17               Expected Discharge Plan:  Home/Self Care  In-House Referral:     Discharge planning Services  CM Consult  Post Acute Care Choice:    Choice offered to:     DME Arranged:    DME Agency:     HH Arranged:    HH Agency:     Status of Service:  Completed, signed off  If discussed at [employer] of Stay Meetings, dates discussed:    Additional Comments:  Carles Collet, RN 05/03/2017, 9:39 AM

## 2017-05-03 NOTE — Discharge Instructions (Signed)

## 2017-05-03 NOTE — Progress Notes (Signed)
Doing well sp BiV ICD implant by Dr [name] Device interrogation is reviewed.  LV threshold elevated, otherwise normal. (see paper chart) CXR reveals no ptx, leads appear stable though LV lead is proximal in location  DC to home Resume home medicines  Routine wound care and follow-up  Thompson Grayer MD, [facility] 05/03/2017 8:10 AM

## 2017-05-05 NOTE — Op Note (Signed)
NAME:  Justin [name], Justin [name]                   ACCOUNT NO.:  MEDICAL RECORD NO.:  [id number]  LOCATION:                                 FACILITY:  PHYSICIAN:  Deboraha Sprang, MD, [facility]   DATE OF BIRTH:  DATE OF PROCEDURE:  05/02/2017 DATE OF DISCHARGE:                              OPERATIVE REPORT   PREOPERATIVE DIAGNOSES:  Cardiomyopathy with ventricular tachycardia, left bundle-branch block, status post transcatheter aortic valve replacement.  POSTOPERATIVE DIAGNOSES:  Cardiomyopathy with ventricular tachycardia, left bundle-branch block, status post transcatheter aortic valve replacement.  PROCEDURES:  Dual-chamber defibrillator implantation with left ventricular lead placement.  DESCRIPTION OF PROCEDURE:  Following obtaining informed consent, the patient was brought to the electrophysiology laboratory and placed on the fluoroscopic table in supine position.  After routine prep and drape of the left upper chest, lidocaine was infiltrated in the prepectoral subclavicular region.  An incision was made and carried down to layer of the prepectoral fascia using electrocautery and sharp dissection.  A pocket was formed similarly.  Thereafter, attention was turned to gain access to the extrathoracic left subclavian vein, which was complicated by 1 arterial puncture with pressure being held for 2 minutes.  Three separate venipunctures were then accomplished.  Guidewires were placed and retained.  Sequentially 9, 9.5, and 7-French sheaths were placed through which we passed a Medtronic 6935 single coil defibrillator lead, serial number [id number]. A St. Jude 135CS cannulation sheath and a Medtronic 5076 52-cm active fixation atrial lead, serial number [id number].  Under fluoroscopic guidance, the right ventricular lead was manipulated to the distal septum with the bipolar R-wave of 10.2 with impedance of 670, a threshold of 0.7 V at 0.5 milliseconds.  Current threshold was 1.0 mA.   There was no diaphragmatic pacing at 10 V, and the current of injury was brisk.  This lead was chosen because the apical site had high pacing thresholds.  We then turned our attention to gain an access to the coronary sinus, which was accomplished without difficulty.  A nonocclusive venogram demonstrated the subselective cannulation of a lateral branch.  It was relatively small.  It was unable to hold a [place] lead, so we passed a Medtronic 4396 88-cm lead, serial number [id number] to a junction between the proximal and mid third.  In this location in a tip to coil configuration, the threshold was 2 V at 0.4 milliseconds.  A QLV in a bipolar configuration was 140 milliseconds.  There was no diaphragmatic pacing at 10 V and the impedance was 960 ohms.  This lead was secured with the removal of the delivery system.  We then deployed the atrial lead in the remnant of the right atrial appendage where the bipolar P-wave was 1.8 with a pace impedance of 877, a threshold of 0.7 V at 0.5 millisecond.  Current of threshold was 0.9 mA.  There was no diaphragmatic pacing at 10 V and the current of injury was brisk.  All of the leads were secured to the prepectoral fascia. The pocket was copiously irrigated with antibiotic-containing saline solution.  The leads were then attached to Medtronic Claria MRI device, serial number [id number].  Through  the device, bipolar P-wave was 1.6 with a pace impedance of 513 ohms with threshold 0.75 V at 0.4 milliseconds.  The R-wave was 13.9 with a pace impedance of 494, threshold 0.5 V at 0.4 millisecond and the LV impedance was 589 with a threshold of 2.25 V at 0.4 millisecond in a tip to coil configuration. With these acceptable parameters recorded, the system was implanted. The pocket having been copiously irrigated was closed following insertion of Surgicel and the securing of the device in 2 layers.  The wound was washed, dried and Dermabond dressing  was applied.  Needle counts, sponge counts, and instrument counts were correct at the end of the procedure according to staff.  The patient tolerated the procedure without apparent complication.     Deboraha Sprang, MD, [facility]     SCK/MEDQ  D:  05/02/2017  T:  05/03/2017  Job:  [id number]

## 2017-05-07 DIAGNOSIS — C61 Malignant neoplasm of prostate: Secondary | ICD-10-CM | POA: Diagnosis not present

## 2017-05-07 DIAGNOSIS — L814 Other melanin hyperpigmentation: Secondary | ICD-10-CM | POA: Diagnosis not present

## 2017-05-07 DIAGNOSIS — D225 Melanocytic nevi of trunk: Secondary | ICD-10-CM | POA: Diagnosis not present

## 2017-05-07 DIAGNOSIS — L821 Other seborrheic keratosis: Secondary | ICD-10-CM | POA: Diagnosis not present

## 2017-05-07 DIAGNOSIS — L570 Actinic keratosis: Secondary | ICD-10-CM | POA: Diagnosis not present

## 2017-05-12 ENCOUNTER — Telehealth: Payer: Self-pay | Admitting: Cardiovascular Disease

## 2017-05-12 DIAGNOSIS — C61 Malignant neoplasm of prostate: Secondary | ICD-10-CM | POA: Diagnosis not present

## 2017-05-12 MED ORDER — DABIGATRAN ETEXILATE MESYLATE 150 MG PO CAPS: 150.0000 mg | ORAL_CAPSULE | Freq: Two times a day (BID) | ORAL | 5 refills | Status: DC

## 2017-05-12 NOTE — Telephone Encounter (Signed)
Was previously on Xarelto but caused lower back pain.  Switched to Eliquis, did well until about 2-3 days ago.  Now has lower back and hip pain.  Of note he was taking his Eliquis only once daily until late last week when he realized the label stated bid.  It was within two days of starting that dose that the back/hip pains developed.    Will have him d/c Eliquis and start Pradaxa 150 mg bid.  Stressed that the med is BID. Rx sent to [facility].    Patient voiced understanding.

## 2017-05-12 NOTE — Telephone Encounter (Signed)
New message    Pt is calling about medication.  Pt c/o medication issue:  1. Name of Medication: Eliquis  2. How are you currently taking this medication (dosage and times per day)? 5 mg two daily  3. Are you having a reaction (difficulty breathing--STAT)? L hip pain and now lower L back pain  4. What is your medication issue? Pt states that he is having pain and he believes it is from this medication. He states that it is hard for him to lean over.

## 2017-05-14 ENCOUNTER — Encounter (INDEPENDENT_AMBULATORY_CARE_PROVIDER_SITE_OTHER): Payer: Self-pay

## 2017-05-14 ENCOUNTER — Ambulatory Visit (INDEPENDENT_AMBULATORY_CARE_PROVIDER_SITE_OTHER): Payer: Medicare Other | Admitting: *Deleted

## 2017-05-14 DIAGNOSIS — Z95810 Presence of automatic (implantable) cardiac defibrillator: Secondary | ICD-10-CM | POA: Diagnosis not present

## 2017-05-14 DIAGNOSIS — I472 Ventricular tachycardia, unspecified: Secondary | ICD-10-CM

## 2017-05-14 DIAGNOSIS — I5042 Chronic combined systolic (congestive) and diastolic (congestive) heart failure: Secondary | ICD-10-CM | POA: Diagnosis not present

## 2017-05-14 LAB — CUP PACEART INCLINIC DEVICE CHECK
Brady Statistic AP VP Percent: 2.32 %
Brady Statistic AS VP Percent: 84.23 %
Brady Statistic AS VS Percent: 13.29 %
Date Time Interrogation Session: 20180919140132
HIGH POWER IMPEDANCE MEASURED VALUE: 57 Ohm
Implantable Lead Implant Date: 20180907
Implantable Lead Implant Date: 20180907
Implantable Lead Location: 753858
Implantable Lead Location: 753859
Implantable Lead Location: 753860
Implantable Lead Model: 4396
Lead Channel Impedance Value: 323 Ohm
Lead Channel Impedance Value: 4047 Ohm
Lead Channel Impedance Value: 4047 Ohm
Lead Channel Impedance Value: 437 Ohm
Lead Channel Impedance Value: 437 Ohm
Lead Channel Pacing Threshold Amplitude: 1.5 V
Lead Channel Pacing Threshold Pulse Width: 0.4 ms
Lead Channel Pacing Threshold Pulse Width: 1 ms
Lead Channel Sensing Intrinsic Amplitude: 1.875 mV
Lead Channel Setting Pacing Amplitude: 3.5 V
Lead Channel Setting Sensing Sensitivity: 0.3 mV
MDC IDC LEAD IMPLANT DT: 20180907
MDC IDC MSMT BATTERY REMAINING LONGEVITY: 60 mo
MDC IDC MSMT BATTERY VOLTAGE: 3.02 V
MDC IDC MSMT LEADCHNL RA PACING THRESHOLD AMPLITUDE: 1 V
MDC IDC MSMT LEADCHNL RV IMPEDANCE VALUE: 304 Ohm
MDC IDC MSMT LEADCHNL RV PACING THRESHOLD AMPLITUDE: 1 V
MDC IDC MSMT LEADCHNL RV PACING THRESHOLD PULSEWIDTH: 0.4 ms
MDC IDC MSMT LEADCHNL RV SENSING INTR AMPL: 12.75 mV
MDC IDC PG IMPLANT DT: 20180907
MDC IDC SET LEADCHNL LV PACING AMPLITUDE: 3.5 V
MDC IDC SET LEADCHNL LV PACING PULSEWIDTH: 1 ms
MDC IDC SET LEADCHNL RV PACING AMPLITUDE: 3.5 V
MDC IDC SET LEADCHNL RV PACING PULSEWIDTH: 0.4 ms
MDC IDC STAT BRADY AP VS PERCENT: 0.16 %
MDC IDC STAT BRADY RA PERCENT PACED: 2.42 %
MDC IDC STAT BRADY RV PERCENT PACED: 75.33 %

## 2017-05-14 NOTE — Progress Notes (Signed)
Wound check appointment. Dermabond removed. Wound without redness or edema. Incision edges approximated, wound well healed. Normal device function. Thresholds, sensing, and impedances consistent with implant measurements. Device programmed at 3.5V for extra safety margin until 3 month visit. Histogram distribution appropriate for patient and level of activity. BiV pacing 77.5%--frequent PVCs and PJCs, Vs episode markers suggest 1:1 SVT, NSVT, and ?AIVR. No mode switches or ventricular arrhythmias noted, VT monitor zone enabled at 176bpm today. RA/RV/LV max pacing impedances increased to 2000ohms per clinic protocol. Patient educated about wound care, arm mobility, lifting restrictions, shock plan, and Carelink monitor. ROV with SK on 08/06/17.

## 2017-05-15 ENCOUNTER — Encounter: Payer: Self-pay | Admitting: Pulmonary Disease

## 2017-05-15 ENCOUNTER — Ambulatory Visit (INDEPENDENT_AMBULATORY_CARE_PROVIDER_SITE_OTHER): Payer: Medicare Other | Admitting: Pulmonary Disease

## 2017-05-15 VITALS — BP 140/76 | HR 86 | Ht 67.0 in | Wt 260.0 lb

## 2017-05-15 DIAGNOSIS — J301 Allergic rhinitis due to pollen: Secondary | ICD-10-CM | POA: Diagnosis not present

## 2017-05-15 DIAGNOSIS — R059 Cough, unspecified: Secondary | ICD-10-CM

## 2017-05-15 DIAGNOSIS — R05 Cough: Secondary | ICD-10-CM

## 2017-05-15 DIAGNOSIS — J309 Allergic rhinitis, unspecified: Secondary | ICD-10-CM | POA: Diagnosis not present

## 2017-05-15 DIAGNOSIS — K219 Gastro-esophageal reflux disease without esophagitis: Secondary | ICD-10-CM | POA: Diagnosis not present

## 2017-05-15 DIAGNOSIS — J4520 Mild intermittent asthma, uncomplicated: Secondary | ICD-10-CM | POA: Diagnosis not present

## 2017-05-15 MED ORDER — BUDESONIDE-FORMOTEROL FUMARATE 80-4.5 MCG/ACT IN AERO: 2.0000 | INHALATION_SPRAY | Freq: Two times a day (BID) | RESPIRATORY_TRACT | 12 refills | Status: DC

## 2017-05-15 MED ORDER — OMEPRAZOLE 20 MG PO CPDR: 20.0000 mg | DELAYED_RELEASE_CAPSULE | Freq: Every day | ORAL | 11 refills | Status: DC

## 2017-05-15 NOTE — Patient Instructions (Signed)
For asthma: Flu shot today Take Symbicort 80/4.52 puffs twice a day  For allergic rhinitis: Stop taking Zyrtec Keep taking Dymista Keep taking Chlor-Trimeton twice a day  For acid reflux: Keep taking Zantac in the evenings Start taking Prilosec in the mornings  Follow-up with our nurse practitioner in 1 months ago over your symptoms and medication list, bring all of your medications to that visit

## 2017-05-15 NOTE — Progress Notes (Signed)
Subjective:    Patient ID: Justin [name], male    DOB: 10/24/1933, 83 y.o.   MRN: [id number]  Synopsis: Former patient of Dr. Gwenette [name] who has irritable larynx syndrome as well as mild persistent asthma.' May 2017 CT scan of the sinuses was a poor study, there is no clear acute or chronic sinusitis noted. Previously treated with immunotherapy by Dr. Velora [name] many years prior.   HPI  Chief Complaint  Patient presents with  . Follow-up    pt states he is doing well, has had a heart valve and pacemaker placed since last OV.     This year Justin [name] had a transaortic valve placed percutaneously. In our last visit we discussed the importance of taking allergic rhinitis treatment and we considered a pH probe with cough did not improve.  Justin [name] had a pacer placed a few weeks ago.  He was hospitalized in may and needed volume removal. He feels better now aith diuresis.  His weight is down to 267 pounds and feels much better.    He says that the diuresis helps his cough and congestion significantly.    Allergic rhinitis and cough: >He still has a little drainage form his sinuses which is associated with cough.  He is treating to the cough with tessalon perles.   > he is still taking the dymista > He is still taking both Chlor-Trimeton and Zyrtec despite me telling him not to do this.  Acid reflux: He stopped taking the Prilosec I told him to take and he is only taking Zantac.  Asthma: > he is still taking the symbicort 80/4.5 2 puffs  He has struggled with body aches from his blood thinners.  Xarelto and Eliquis caused back pain, Pradaxa is causing heart burn.    Past Medical History:  Diagnosis Date  . Age-related macular degeneration, dry, both eyes   . Allergic    "24/7; 365 days/year; I'm allergic to pollens, dust, all southern grasses/trees, mold, mildue, cats, dogs" (01/07/2017)  . Asthma    sees Dr. [place] [name]   . Benign prostatic hypertrophy    (sees Dr. Denman [name]  . CAD  (coronary artery disease)    a. s/p CABG 2007. b. Cath 08/2016 - 4/4 patent grafts.  . Carotid bruit    carotid u/s 10/10: 0.39% bilaterally  . Chronic combined systolic and diastolic CHF (congestive heart failure) (Justin [name])   . Complication of anesthesia 1980s   "w/anal cyst OR, he gave me a saddle block then put a narcotic in spinal cord; had a severe reaction to that" (01/07/2017)  . ED (erectile dysfunction)   . Family history of adverse reaction to anesthesia    "daughter wakes up during OR" (01/07/2017)  . GERD (gastroesophageal reflux disease)   . Gout   . HTN (hypertension)   . Hx of colonic polyps    (sees Dr. Henrene [name])  . Hyperlipidemia   . Hypothyroidism   . Moderate to severe aortic stenosis    a. s/p TAVR 02/2017.  [name] Myocardial infarction (Justin [name]) ~ 2000  . Obesity   . Osteoarthritis    "was in my knees, hands" (01/07/2017 )  . PAF (paroxysmal atrial fibrillation) ([place])    a. documented post TAVR.  [name] Precancerous skin lesion    (sees Dr. Allyson [name])  . Prostate cancer (Justin [name]) dx'd ~ 2014  . 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  HENT: Negative for postnasal drip, rhinorrhea and sinus pressure.   Respiratory: Positive for cough. Negative for shortness of breath and wheezing.   Cardiovascular: Negative for chest pain, palpitations and leg swelling.       Objective:   Physical Exam  Vitals:   05/15/17 0958  BP: 140/76  Pulse: 86  SpO2: 97%  Weight: 260 lb (117.9 kg)  Height: 5\' 7"  (1.702 m)    RA  Gen: well appearing HENT: OP clear, TM's clear, neck supple PULM: CTA B, normal percussion CV: RRR, no mgr, trace edema GI: BS+, soft, nontender Derm: no cyanosis or rash Psyche: normal mood and affect    CBC    Component Value Date/Time    WBC 10.5 04/23/2017 1012   WBC 14.4 (H) 03/07/2017 0231   RBC 4.99 04/23/2017 1012   RBC 4.59 03/07/2017 0231   HGB 13.9 04/23/2017 1012   HCT 42.1 04/23/2017 1012   PLT 241 04/23/2017 1012   MCV 84 04/23/2017 1012   MCH 27.9 04/23/2017 1012   MCH 27.0 03/07/2017 0231   MCHC 33.0 04/23/2017 1012   MCHC 31.9 03/07/2017 0231   RDW 15.8 (H) 04/23/2017 1012   LYMPHSABS 2.0 04/23/2017 1012   MONOABS 1.5 (H) 01/07/2017 1705   EOSABS 0.3 04/23/2017 1012   BASOSABS 0.1 04/23/2017 1012   Imaging: 12/2015 CT sinuses without significant disease  Labs  12/2016 IgE > 180  Records from his recent trans-aortic valve replacement reviewed     Assessment & Plan:  Cough  Gastroesophageal reflux disease, esophagitis presence not specified  Allergic rhinitis, unspecified seasonality, unspecified trigger  Acute seasonal allergic rhinitis due to pollen  Mild intermittent asthma without complication   Discussion:  Fortunately [name] has done very well since his diagnosis of heart failure, valve replacement, and pacemaker placement.  However, he continues to struggle with cough related to acid reflux. Despite my best efforts he is still quite confused about his medication regimen. He is still having acid reflux symptoms which are causing his cough, and I believe that this is due to him not taking the medications as prescribed.  We spent a significant amount of time today clarifying which regimen he supposed to be on for his acid reflux, allergic rhinitis, and asthma.  Plan: For asthma: Flu shot today Take Symbicort 80/4.52 puffs twice a day  For allergic rhinitis: Stop taking Zyrtec Keep taking Dymista Keep taking Chlor-Trimeton twice a day  For acid reflux: Keep taking Zantac in the evenings Start taking Prilosec in the mornings  Follow-up with our nurse practitioner in 1 months ago over your symptoms and medication list, bring all of your medications to that visit  > 50% of  this 27 minute visit spent face to face   Current Outpatient Prescriptions:  .  aspirin 81 MG tablet, Take 1 tablet (81 mg total) by mouth daily., Disp: 30 tablet, Rfl: 0 .  atorvastatin (LIPITOR) 20 MG tablet, TAKE 1 TABLET ONCE DAILY. (Patient taking differently: TAKE 1 TABLET ONCE DAILY IN THE EVENING), Disp: 90 tablet, Rfl: 3 .  Azelastine-Fluticasone (DYMISTA) 137-50 MCG/ACT SUSP, Place 2 puffs into the nose 2 (two) times daily., Disp: 1 Bottle, Rfl: 6 .  benzonatate (TESSALON) 200 MG capsule, Take 1 capsule (200 mg total) by mouth 2 (two) times daily as needed for cough. (Patient taking differently: Take 200 mg by mouth 2 (  two) times daily. ), Disp: 60 capsule, Rfl: 5 .  chlorpheniramine (CHLOR-TRIMETON) 4 MG tablet, Take 8 mg by mouth 2 (two) times daily. , Disp: , Rfl:  .  dabigatran (PRADAXA) 150 MG CAPS capsule, Take 1 capsule (150 mg total) by mouth 2 (two) times daily., Disp: 60 capsule, Rfl: 5 .  furosemide (LASIX) 80 MG tablet, Take 1 tablet (80 mg total) by mouth daily., Disp: 90 tablet, Rfl: 3 .  isosorbide mononitrate (IMDUR) 30 MG 24 hr tablet, Take 1 tablet (30 mg total) by mouth daily., Disp: 90 tablet, Rfl: 3 .  Ketotifen Fumarate (ZADITOR OP), Apply 1 drop to eye daily as needed (dry eyes)., Disp: , Rfl:  .  levothyroxine (SYNTHROID, LEVOTHROID) 75 MCG tablet, Take 1 tablet (75 mcg total) by mouth daily. (Patient taking differently: Take 75 mcg by mouth at bedtime. ), Disp: 90 tablet, Rfl: 3 .  losartan (COZAAR) 25 MG tablet, Take 1 tablet (25 mg total) by mouth daily., Disp: 30 tablet, Rfl: 5 .  metoprolol tartrate (LOPRESSOR) 25 MG tablet, Take 1 tablet (25 mg total) by mouth 2 (two) times daily., Disp: 180 tablet, Rfl: 3 .  montelukast (SINGULAIR) 10 MG tablet, TAKE 1 TABLET IN THE EVENING., Disp: 60 tablet, Rfl: 5 .  Multiple Vitamins-Minerals (PRESERVISION AREDS PO), Take 2 tablets by mouth every morning., Disp: , Rfl:  .  potassium chloride SA (K-DUR,KLOR-CON) 20 MEQ  tablet, TAKE (2) TABLETS DAILY., Disp: 180 tablet, Rfl: 3 .  ranitidine (ZANTAC) 150 MG capsule, Take 150 mg by mouth every evening. , Disp: , Rfl:  .  triamcinolone cream (KENALOG) 0.1 %, Apply 1 application topically daily as needed for dry skin., Disp: , Rfl:  .  budesonide-formoterol (SYMBICORT) 80-4.5 MCG/ACT inhaler, Inhale 2 puffs into the lungs 2 (two) times daily., Disp: 1 Inhaler, Rfl: 12 .  omeprazole (PRILOSEC) 20 MG capsule, Take 1 capsule (20 mg total) by mouth daily., Disp: 30 capsule, Rfl: 11

## 2017-05-16 ENCOUNTER — Telehealth: Payer: Self-pay | Admitting: Cardiovascular Disease

## 2017-05-16 NOTE — Telephone Encounter (Signed)
I spoke with Justin [name] and told him I would make referral to cardiac rehab. Dr. Angelena Form was to see Justin [name] back in February 2019 for 6 month follow up. Justin [name] will be out of the country from 08/21/17 -12/28/17.  He is seeing Dr. [name] on 08/06/17.  Justin [name] is asking if he needs to see Dr. Angelena Form prior to leaving or if OK to wait until he returns in May.  I told Justin [name] I would check with Dr. Angelena Form and call him back if he needed appointment prior to leaving.

## 2017-05-16 NOTE — Telephone Encounter (Signed)
I would be glad to see him. Do I have anything end of November? chris

## 2017-05-16 NOTE — Telephone Encounter (Signed)
I spoke with pt and scheduled him to see Dr. Angelena Form on 07/24/17 at 10:40

## 2017-05-16 NOTE — Telephone Encounter (Signed)
New message   Pt calling to leave a message for Dr. Angelena Form nurse. States that he would like to take cardiac rehabilitation now.

## 2017-05-20 ENCOUNTER — Telehealth: Payer: Self-pay | Admitting: Cardiovascular Disease

## 2017-05-20 NOTE — Telephone Encounter (Signed)
Walk in Pt Form-Questions about what goes on an ID Bractlet. Placed in [place] doc box

## 2017-05-23 ENCOUNTER — Telehealth (HOSPITAL_COMMUNITY): Payer: Self-pay | Admitting: Pharmacist

## 2017-05-23 NOTE — Telephone Encounter (Signed)
Cardiac Rehab Medication Review by a Pharmacist  Does the patient  feel that his/her medications are working for him/her?  yes  Has the patient been experiencing any side effects to the medications prescribed?  no  Does the patient measure his/her own blood pressure or blood glucose at home?  no   Does the patient have any problems obtaining medications due to transportation or finances?   no  Understanding of regimen: good Understanding of indications: good Potential of compliance: good   Pharmacist comments: Justin [name] is a pleasant 83 y/o male who endorses compliance with all his medications and denies any adverse effects. He reports that this furosemide dose is working very well for him for diuresis. He experiences nasal congestion and allergy symptoms daily and cannot stop Dymista or chlorpheniramine. He does not take his BP at home but reports his BP has been consistent over the past several months. He also reports that he will be traveling for ~6 months starting December and will need long term supply of medication.    [name], PharmD PGY1 Pharmacy Resident 05/23/2017 3:49 PM

## 2017-05-24 ENCOUNTER — Other Ambulatory Visit: Payer: Self-pay | Admitting: Pulmonary Disease

## 2017-05-26 ENCOUNTER — Other Ambulatory Visit: Payer: Self-pay | Admitting: *Deleted

## 2017-05-26 MED ORDER — LOSARTAN POTASSIUM 25 MG PO TABS: 25.0000 mg | ORAL_TABLET | Freq: Every day | ORAL | 3 refills | Status: DC

## 2017-05-26 MED ORDER — FUROSEMIDE 80 MG PO TABS: 80.0000 mg | ORAL_TABLET | Freq: Every day | ORAL | 3 refills | Status: DC

## 2017-05-26 MED ORDER — DABIGATRAN ETEXILATE MESYLATE 150 MG PO CAPS: 150.0000 mg | ORAL_CAPSULE | Freq: Two times a day (BID) | ORAL | 3 refills | Status: DC

## 2017-05-26 MED ORDER — POTASSIUM CHLORIDE CRYS ER 20 MEQ PO TBCR: EXTENDED_RELEASE_TABLET | ORAL | 3 refills | Status: DC

## 2017-05-26 MED ORDER — METOPROLOL TARTRATE 25 MG PO TABS: 25.0000 mg | ORAL_TABLET | Freq: Two times a day (BID) | ORAL | 3 refills | Status: DC

## 2017-05-27 ENCOUNTER — Encounter: Payer: Self-pay | Admitting: Family Medicine

## 2017-05-27 ENCOUNTER — Ambulatory Visit (INDEPENDENT_AMBULATORY_CARE_PROVIDER_SITE_OTHER): Payer: Medicare Other | Admitting: Family Medicine

## 2017-05-27 VITALS — BP 122/81 | HR 64 | Temp 97.6°F | Ht 67.0 in | Wt 262.0 lb

## 2017-05-27 DIAGNOSIS — M7989 Other specified soft tissue disorders: Secondary | ICD-10-CM

## 2017-05-27 MED ORDER — OMEPRAZOLE 20 MG PO CPDR: 20.0000 mg | DELAYED_RELEASE_CAPSULE | Freq: Every day | ORAL | 4 refills | Status: DC

## 2017-05-27 MED ORDER — MONTELUKAST SODIUM 10 MG PO TABS: 10.0000 mg | ORAL_TABLET | Freq: Every evening | ORAL | 4 refills | Status: DC

## 2017-05-27 MED ORDER — AZITHROMYCIN 250 MG PO TABS: ORAL_TABLET | ORAL | 0 refills | Status: DC

## 2017-05-27 NOTE — Patient Instructions (Signed)
WE NOW OFFER   [facility] FAST TRACK!!!  SAME DAY Appointments for ACUTE CARE  Such as: Sprains, Injuries, cuts, abrasions, rashes, muscle pain, joint pain, back pain Colds, flu, sore throats, headache, allergies, cough, fever  Ear pain, sinus and eye infections Abdominal pain, nausea, vomiting, diarrhea, upset stomach Animal/insect bites  3 Easy Ways to Schedule: Walk-In Scheduling Call in scheduling Mychart Sign-up: [email or web address]         

## 2017-05-27 NOTE — Progress Notes (Signed)
   Subjective:    Patient ID: Justin [name], male    DOB: 1933/10/02, 83 y.o.   MRN: [id number]  HPI Here for 4 weeks of swelling in the left arm, primarily in the forearm and wrist. There is no pain. No other swelling in the extremities. No SOB. On 05-02-17 he had a pacemaker implanted. He has had numerous IVs and venipunctures in the arm in the past few months. He is on Pradaxa. Of note he and his wife will be cruising around the world from December to May.   Review of Systems  Constitutional: Negative.   Respiratory: Negative.   Cardiovascular: Negative.   Neurological: Negative.        Objective:   Physical Exam  Constitutional: He is oriented to person, place, and time. He appears well-developed and well-nourished.  Cardiovascular: Normal rate, regular rhythm, normal heart sounds and intact distal pulses.   Pulmonary/Chest: Effort normal and breath sounds normal. No respiratory distress. He has no wheezes. He has no rales.  Musculoskeletal:  There is 1+ edema in the left lower arm and wrist. The hand appears normal. No erythema or warmth or tenderness. No axillary adenopathy  Neurological: He is alert and oriented to person, place, and time.          Assessment & Plan:  Left arm swelling consistent with venous engorgement. Lymphedema is less likely. No evidence of infection. We will set up a venous doppler soon to evaluate.  [name], MD

## 2017-05-28 ENCOUNTER — Telehealth (HOSPITAL_COMMUNITY): Payer: Self-pay | Admitting: *Deleted

## 2017-05-28 NOTE — Telephone Encounter (Signed)
-----   Message from [place], [place] sent at 05/27/2017  5:00 PM EDT ----- Regarding: RE: Justin [name] to participate in cardiac rehab after ICD placed on 9/7 This is what they are given at the time of implant for activity restrictions.  In general no high reaching, heavy pulling or vigorous activities with arm on the side of the device ---- Supplemental Discharge Instructions for  Pacemaker/Defibrillator Patients Activity No heavy lifting or vigorous activity with your left/right arm for 6 to 8 weeks.  Do not raise your left/right arm above your head for one week.    Thanks for checking!!! Renee         ----- Message ----- From: Rowe Pavy, RN Sent: 05/27/2017   4:26 PM To: [place], PA-C Subject: Justin [name] to participate in cardiac rehab after I#  Renee,  The above patient referred to cardiac rehab s/p TAVR on 03/04/17.  Pt had ICD placed on 05/02/17 by [name]. Pt has completed her wound check and is anxious to get started in exercise.  Any restrictions and or limitations of activity for cardiac rehab?  Thanks for you for your input  Maurice Small RN, BSN Cardiac and Pulmonary Rehab Nurse Navigator

## 2017-05-29 ENCOUNTER — Encounter (HOSPITAL_COMMUNITY)
Admission: RE | Admit: 2017-05-29 | Discharge: 2017-05-29 | Disposition: A | Discharge status: Home / Self Care | Payer: Medicare Other | Source: Ambulatory Visit | Attending: Cardiovascular Disease | Admitting: Cardiovascular Disease

## 2017-05-29 ENCOUNTER — Encounter (HOSPITAL_COMMUNITY): Payer: Self-pay

## 2017-05-29 ENCOUNTER — Telehealth (HOSPITAL_COMMUNITY): Payer: Self-pay | Admitting: *Deleted

## 2017-05-29 VITALS — BP 124/60 | HR 89 | Ht 67.75 in | Wt 262.6 lb

## 2017-05-29 DIAGNOSIS — Z952 Presence of prosthetic heart valve: Secondary | ICD-10-CM | POA: Diagnosis not present

## 2017-05-29 DIAGNOSIS — Z48812 Encounter for surgical aftercare following surgery on the circulatory system: Secondary | ICD-10-CM | POA: Diagnosis not present

## 2017-05-29 NOTE — Progress Notes (Signed)
Cardiac Individual Treatment Plan  Patient Details  Name: Justin [name] MRN: [id number] Date of Birth: 03-30-1934 Referring Provider:     CARDIAC REHAB PHASE II ORIENTATION from 05/29/2017 in [facility]  Referring Provider  Darlina Guys      Initial Encounter Date:    CARDIAC REHAB PHASE II ORIENTATION from 05/29/2017 in [facility]  Date  05/29/17  Referring Provider  Darlina Guys      Visit Diagnosis: S/P TAVR (transcatheter aortic valve replacement)  Patient's Home Medications on Admission:  Current Outpatient Prescriptions:  .  aspirin 81 MG tablet, Take 1 tablet (81 mg total) by mouth daily., Disp: 30 tablet, Rfl: 0 .  atorvastatin (LIPITOR) 20 MG tablet, TAKE 1 TABLET ONCE DAILY. (Patient taking differently: TAKE 1 TABLET ONCE DAILY IN THE EVENING), Disp: 90 tablet, Rfl: 3 .  azithromycin (ZITHROMAX Z-PAK) 250 MG tablet, As directed, Disp: 6 each, Rfl: 0 .  azithromycin (ZITHROMAX Z-PAK) 250 MG tablet, As directed, Disp: 6 each, Rfl: 0 .  benzonatate (TESSALON) 200 MG capsule, Take 1 capsule (200 mg total) by mouth 2 (two) times daily as needed for cough. (Patient taking differently: Take 200 mg by mouth 2 (two) times daily. ), Disp: 60 capsule, Rfl: 5 .  budesonide-formoterol (SYMBICORT) 80-4.5 MCG/ACT inhaler, Inhale 2 puffs into the lungs 2 (two) times daily., Disp: 1 Inhaler, Rfl: 12 .  chlorpheniramine (CHLOR-TRIMETON) 4 MG tablet, Take 8 mg by mouth 2 (two) times daily. , Disp: , Rfl:  .  dabigatran (PRADAXA) 150 MG CAPS capsule, Take 1 capsule (150 mg total) by mouth 2 (two) times daily., Disp: 180 capsule, Rfl: 3 .  DYMISTA 137-50 MCG/ACT SUSP, USE 2 SPRAYS EACH NOSTRIL TWICE DAILY., Disp: 23 g, Rfl: 11 .  furosemide (LASIX) 80 MG tablet, Take 1 tablet (80 mg total) by mouth daily., Disp: 90 tablet, Rfl: 3 .  isosorbide mononitrate (IMDUR) 30 MG 24 hr tablet, Take 1 tablet (30 mg total) by mouth  daily., Disp: 90 tablet, Rfl: 3 .  Ketotifen Fumarate (ZADITOR OP), Apply 1 drop to eye daily as needed (dry eyes)., Disp: , Rfl:  .  levothyroxine (SYNTHROID, LEVOTHROID) 75 MCG tablet, Take 1 tablet (75 mcg total) by mouth daily. (Patient taking differently: Take 75 mcg by mouth at bedtime. ), Disp: 90 tablet, Rfl: 3 .  losartan (COZAAR) 25 MG tablet, Take 1 tablet (25 mg total) by mouth daily., Disp: 90 tablet, Rfl: 3 .  metoprolol tartrate (LOPRESSOR) 25 MG tablet, Take 1 tablet (25 mg total) by mouth 2 (two) times daily., Disp: 180 tablet, Rfl: 3 .  montelukast (SINGULAIR) 10 MG tablet, Take 1 tablet (10 mg total) by mouth every evening., Disp: 90 tablet, Rfl: 4 .  Multiple Vitamins-Minerals (PRESERVISION AREDS PO), Take 2 tablets by mouth every morning., Disp: , Rfl:  .  omeprazole (PRILOSEC) 20 MG capsule, Take 1 capsule (20 mg total) by mouth daily., Disp: 90 capsule, Rfl: 4 .  potassium chloride SA (K-DUR,KLOR-CON) 20 MEQ tablet, TAKE (2) TABLETS DAILY., Disp: 180 tablet, Rfl: 3 .  ranitidine (ZANTAC) 150 MG capsule, Take 150 mg by mouth every evening. , Disp: , Rfl:  .  triamcinolone cream (KENALOG) 0.1 %, Apply 1 application topically daily as needed for dry skin., Disp: , Rfl:   Past Medical History: Past Medical History:  Diagnosis Date  . Age-related macular degeneration, dry, both eyes   . Allergic    "24/7; 365 days/year;  I'm allergic to pollens, dust, all southern grasses/trees, mold, mildue, cats, dogs" (01/07/2017)  . Asthma    sees [name]   . Benign prostatic hypertrophy    (sees Dr. Denman George  . CAD (coronary artery disease)    a. s/p CABG 2007. b. Cath 08/2016 - 4/4 patent grafts.  . Carotid bruit    carotid u/s 10/10: 0.39% bilaterally  . Chronic combined systolic and diastolic CHF (congestive heart failure) ([place])   . Complication of anesthesia 1980s   "w/anal cyst OR, he gave me a saddle block then put a narcotic in spinal cord; had a severe reaction to that"  (01/07/2017)  . ED (erectile dysfunction)   . Family history of adverse reaction to anesthesia    "daughter wakes up during OR" (01/07/2017)  . GERD (gastroesophageal reflux disease)   . Gout   . HTN (hypertension)   . Hx of colonic polyps    (sees Dr. Henrene Pastor)  . Hyperlipidemia   . Hypothyroidism   . Moderate to severe aortic stenosis    a. s/p TAVR 02/2017.  [name] Myocardial infarction ([place]) ~ 2000  . Obesity   . Osteoarthritis    "was in my knees, hands" (01/07/2017 )  . PAF (paroxysmal atrial fibrillation) ([place])    a. documented post TAVR.  [name] Precancerous skin lesion    (sees Dr. Allyson Sabal)  . Prostate cancer ([place]) dx'd ~ 2014  . 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 Former Smoker  . Packs/day: 3.50  . Years: 13.00  . Types: Cigarettes  . Quit date: 1963  Smokeless Tobacco  . Never Used    Labs: Recent Review Flowsheet Data    Labs for ITP Cardiac and Pulmonary Rehab Latest Ref Rng & Units 08/27/2016 09/25/2016 02/27/2017 03/04/2017 03/04/2017   Cholestrol 0 - 200 mg/dL - 109 - - -   LDLCALC 0 - 99 mg/dL - 53 - - -   HDL >39.00 mg/dL - 29.80(L) - - -   Trlycerides 0.0 - 149.0 mg/dL - 133.0 - - -   Hemoglobin A1c 4.8 - 5.6 % - - 6.5(H) - -   PHART 7.350 - 7.450 7.437 - 7.475(H) - -   PCO2ART 32.0 - 48.0 mmHg 39.2 - 36.4 - -   HCO3 20.0 - 28.0 mmol/L 26.5 - 26.5 - -   TCO2 0 - 100 mmol/L 28 - - 28 28   O2SAT % 94.0 - 95.0 - -      Capillary Blood Glucose: No results found for: GLUCAP   Exercise Target Goals: Date: 05/29/17  Exercise Program Goal: Individual exercise prescription set with THRR, safety & activity barriers. Participant demonstrates ability to understand and report RPE using BORG scale, to self-measure pulse accurately, and to acknowledge the importance of the exercise  prescription.  Exercise Prescription Goal: Starting with aerobic activity 30 plus minutes a day, 3 days per week for initial exercise prescription. Provide home exercise prescription and guidelines that participant acknowledges understanding prior to discharge.  Activity Barriers & Risk Stratification:     Activity Barriers & Cardiac Risk Stratification - 05/29/17 0819      Activity Barriers & Cardiac Risk Stratification   Activity Barriers Joint Problems;Deconditioning;Muscular Weakness;Incisional Pain;Left Knee Replacement;Right Knee Replacement;Other (comment)   Comments toes straighten, R thumb surgery  Cardiac Risk Stratification High      6 Minute Walk:     6 Minute Walk    Row Name 05/29/17 0820 05/29/17 1015 05/29/17 1033     6 Minute Walk   Phase Initial  -  -   Distance 1372 feet  -  -   Distance Feet Change  - 1372 ft  -   Walk Time 6 minutes  -  -   # of Rest Breaks 0  -  -   MPH  - 2.6  -   METS  - 1.87  -   RPE 11  -  -   VO2 Peak  - 1372  -   Symptoms  - Yes (comment)  -   Comments  - brief chest pressure noted at the end. Resolved at end of walk test  -   Resting HR 89 bpm  -  -   Resting BP 124/60  -  -   Resting Oxygen Saturation  96 %  -  -   Exercise Oxygen Saturation  during 6 min walk 96 %  -  -   Max Ex. HR 111 bpm  -  -   Max Ex. BP 134/64  -  -   2 Minute Post BP  -  - 122/70      Oxygen Initial Assessment:   Oxygen Re-Evaluation:   Oxygen Discharge (Final Oxygen Re-Evaluation):   Initial Exercise Prescription:     Initial Exercise Prescription - 05/29/17 1000      Date of Initial Exercise RX and Referring Provider   Date 05/29/17   Referring Provider Darlina Guys     Treadmill   MPH 1.8   Grade 0   Minutes 10   METs 2.38     Bike   Level --   Minutes --   METs --     Recumbant Bike   Level 1.5   Minutes 10   METs 1.8     NuStep   Level 2   SPM 70   Minutes 10   METs 2     Prescription Details    Frequency (times per week) 3   Duration Progress to 30 minutes of continuous aerobic without signs/symptoms of physical distress     Intensity   THRR 40-80% of Max Heartrate 56-112   Ratings of Perceived Exertion 11-13   Perceived Dyspnea 0-4     Progression   Progression Continue to progress workloads to maintain intensity without signs/symptoms of physical distress.     Resistance Training   Training Prescription No  recent IDC placement on 05/02/17      Perform Capillary Blood Glucose checks as needed.  Exercise Prescription Changes:   Exercise Comments:   Exercise Goals and Review:      Exercise Goals    Row Name 05/29/17 0820             Exercise Goals   Increase Physical Activity Yes       Intervention Provide advice, education, support and counseling about physical activity/exercise needs.;Develop an individualized exercise prescription for aerobic and resistive training based on initial evaluation findings, risk stratification, comorbidities and participant's personal goals.       Expected Outcomes Achievement of increased cardiorespiratory fitness and enhanced flexibility, muscular endurance and strength shown through measurements of functional capacity and personal statement of participant.       Increase Strength and Stamina Yes  learn activity/exercise limitations  Intervention Provide advice, education, support and counseling about physical activity/exercise needs.;Develop an individualized exercise prescription for aerobic and resistive training based on initial evaluation findings, risk stratification, comorbidities and participant's personal goals.       Expected Outcomes Achievement of increased cardiorespiratory fitness and enhanced flexibility, muscular endurance and strength shown through measurements of functional capacity and personal statement of participant.       Able to understand and use rate of perceived exertion (RPE) scale Yes        Intervention Provide education and explanation on how to use RPE scale       Expected Outcomes Short Term: Able to use RPE daily in rehab to express subjective intensity level;Long Term:  Able to use RPE to guide intensity level when exercising independently       Knowledge and understanding of Target Heart Rate Range (THRR) Yes       Intervention Provide education and explanation of THRR including how the numbers were predicted and where they are located for reference       Expected Outcomes Short Term: Able to state/look up THRR;Long Term: Able to use THRR to govern intensity when exercising independently;Short Term: Able to use daily as guideline for intensity in rehab       Able to check pulse independently Yes       Intervention Provide education and demonstration on how to check pulse in carotid and radial arteries.;Review the importance of being able to check your own pulse for safety during independent exercise       Expected Outcomes Short Term: Able to explain why pulse checking is important during independent exercise;Long Term: Able to check pulse independently and accurately       Understanding of Exercise Prescription Yes       Intervention Provide education, explanation, and written materials on patient's individual exercise prescription       Expected Outcomes Short Term: Able to explain program exercise prescription;Long Term: Able to explain home exercise prescription to exercise independently          Exercise Goals Re-Evaluation :    Discharge Exercise Prescription (Final Exercise Prescription Changes):   Nutrition:  Target Goals: Understanding of nutrition guidelines, daily intake of sodium 1500mg , cholesterol 200mg , calories 30% from fat and 7% or less from saturated fats, daily to have 5 or more servings of fruits and vegetables.  Biometrics:     Pre Biometrics - 05/29/17 1027      Pre Biometrics   Waist Circumference 51 inches   Hip Circumference 49.5 inches    Waist to Hip Ratio 1.03 %   Triceps Skinfold 29 mm   % Body Fat 40.7 %   Grip Strength 39 kg   Flexibility 7.5 in   Single Leg Stand 4.45 seconds       Nutrition Therapy Plan and Nutrition Goals:   Nutrition Discharge: Nutrition Scores:   Nutrition Goals Re-Evaluation:   Nutrition Goals Re-Evaluation:   Nutrition Goals Discharge (Final Nutrition Goals Re-Evaluation):   Psychosocial: Target Goals: Acknowledge presence or absence of significant depression and/or stress, maximize coping skills, provide positive support system. Participant is able to verbalize types and ability to use techniques and skills needed for reducing stress and depression.  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 Yes   Comments Pt prior participant in cardiac rehab.  Pt displays positive and healthy outlook on life and feels supported by his family.      Barriers   Psychosocial barriers to participate in program There are no identifiable barriers or psychosocial needs.      Quality of Life Scores:     Quality of Life - 05/29/17 1029      Quality of Life Scores   Health/Function Pre 29.11 %   Socioeconomic Pre 29.38 %   Psych/Spiritual Pre 30 %   Family Pre 30 %   GLOBAL Pre 29.49 %      PHQ-9: Recent Review Flowsheet Data    Depression screen [facility] 2/9 01/17/2017 12/20/2014 11/12/2013   Decreased Interest 0 0 0   Down, Depressed, Hopeless 0 0 0   PHQ - 2 Score 0 0 0     Interpretation of Total Score  Total Score Depression Severity:  1-4 = Minimal depression, 5-9 = Mild depression, 10-14 = Moderate depression, 15-19 = Moderately severe depression, 20-27 = Severe depression   Psychosocial Evaluation and  Intervention:   Psychosocial Re-Evaluation:   Psychosocial Discharge (Final Psychosocial Re-Evaluation):   Vocational Rehabilitation: Provide vocational rehab assistance to qualifying candidates.   Vocational Rehab Evaluation & Intervention:   Education: Education Goals: Education classes will be provided on a weekly basis, covering required topics. Participant will state understanding/return demonstration of topics presented.  Learning Barriers/Preferences:     Learning Barriers/Preferences - 05/29/17 1030      Learning Barriers/Preferences   Learning Barriers Sight      Education Topics: Count Your Pulse:  -Group instruction provided by verbal instruction, demonstration, patient participation and written materials to support subject.  Instructors address importance of being able to find your pulse and how to count your pulse when at home without a heart monitor.  Patients get hands on experience counting their pulse with staff help and individually.   Heart Attack, Angina, and Risk Factor Modification:  -Group instruction provided by verbal instruction, video, and written materials to support subject.  Instructors address signs and symptoms of angina and heart attacks.    Also discuss risk factors for heart disease and how to make changes to improve heart health risk factors.   Functional Fitness:  -Group instruction provided by verbal instruction, demonstration, patient participation, and written materials to support subject.  Instructors address safety measures for doing things around the house.  Discuss how to get up and down off the floor, how to pick things up properly, how to safely get out of a chair without assistance, and balance training.   Meditation and Mindfulness:  -Group instruction provided by verbal instruction, patient participation, and written materials to support subject.  Instructor addresses importance of mindfulness and meditation practice to help  reduce stress and improve awareness.  Instructor also leads participants through a meditation exercise.    Stretching for Flexibility and Mobility:  -Group instruction provided by verbal instruction, patient participation, and written materials to support subject.  Instructors lead participants through series of stretches that are designed to increase flexibility thus improving mobility.  These stretches are additional exercise for major muscle groups that are typically performed during regular warm up and cool down.   Hands Only CPR:  -Group verbal, video, and participation provides a basic overview of AHA guidelines for community CPR. Role-play of  emergencies allow participants the opportunity to practice calling for help and chest compression technique with discussion of AED use.   Hypertension: -Group verbal and written instruction that provides a basic overview of hypertension including the most recent diagnostic guidelines, risk factor reduction with self-care instructions and medication management.    Nutrition I class: Heart Healthy Eating:  -Group instruction provided by PowerPoint slides, verbal discussion, and written materials to support subject matter. The instructor gives an explanation and review of the Therapeutic Lifestyle Changes diet recommendations, which includes a discussion on lipid goals, dietary fat, sodium, fiber, plant stanol/sterol esters, sugar, and the components of a well-balanced, healthy diet.   Nutrition II class: Lifestyle Skills:  -Group instruction provided by PowerPoint slides, verbal discussion, and written materials to support subject matter. The instructor gives an explanation and review of label reading, grocery shopping for heart health, heart healthy recipe modifications, and ways to make healthier choices when eating out.   Diabetes Question & Answer:  -Group instruction provided by PowerPoint slides, verbal discussion, and written materials to  support subject matter. The instructor gives an explanation and review of diabetes co-morbidities, pre- and post-prandial blood glucose goals, pre-exercise blood glucose goals, signs, symptoms, and treatment of hypoglycemia and hyperglycemia, and foot care basics.   Diabetes Blitz:  -Group instruction provided by PowerPoint slides, verbal discussion, and written materials to support subject matter. The instructor gives an explanation and review of the physiology behind type 1 and type 2 diabetes, diabetes medications and rational behind using different medications, pre- and post-prandial blood glucose recommendations and Hemoglobin A1c goals, diabetes diet, and exercise including blood glucose guidelines for exercising safely.    Portion Distortion:  -Group instruction provided by PowerPoint slides, verbal discussion, written materials, and food models to support subject matter. The instructor gives an explanation of serving size versus portion size, changes in portions sizes over the last 20 years, and what consists of a serving from each food group.   Stress Management:  -Group instruction provided by verbal instruction, video, and written materials to support subject matter.  Instructors review role of stress in heart disease and how to cope with stress positively.     Exercising on Your Own:  -Group instruction provided by verbal instruction, power point, and written materials to support subject.  Instructors discuss benefits of exercise, components of exercise, frequency and intensity of exercise, and end points for exercise.  Also discuss use of nitroglycerin and activating EMS.  Review options of places to exercise outside of rehab.  Review guidelines for sex with heart disease.   Cardiac Drugs I:  -Group instruction provided by verbal instruction and written materials to support subject.  Instructor reviews cardiac drug classes: antiplatelets, anticoagulants, beta blockers, and statins.   Instructor discusses reasons, side effects, and lifestyle considerations for each drug class.   Cardiac Drugs II:  -Group instruction provided by verbal instruction and written materials to support subject.  Instructor reviews cardiac drug classes: angiotensin converting enzyme inhibitors (ACE-I), angiotensin II receptor blockers (ARBs), nitrates, and calcium channel blockers.  Instructor discusses reasons, side effects, and lifestyle considerations for each drug class.   Anatomy and Physiology of the Circulatory System:  Group verbal and written instruction and models provide basic cardiac anatomy and physiology, with the coronary electrical and arterial systems. Review of: AMI, Angina, Valve disease, Heart Failure, Peripheral Artery Disease, Cardiac Arrhythmia, Pacemakers, and the ICD.   Other Education:  -Group or individual verbal, written, or video instructions that support the educational  goals of the cardiac rehab program.   Knowledge Questionnaire Score:     Knowledge Questionnaire Score - 05/29/17 1030      Knowledge Questionnaire Score   Pre Score 22/24      Core Components/Risk Factors/Patient Goals at Admission:     Personal Goals and Risk Factors at Admission - 05/29/17 0821      Core Components/Risk Factors/Patient Goals on Admission    Weight Management Yes;Weight Maintenance;Weight Loss;Obesity   Intervention Weight Management: Develop a combined nutrition and exercise program designed to reach desired caloric intake, while maintaining appropriate intake of nutrient and fiber, sodium and fats, and appropriate energy expenditure required for the weight goal.;Weight Management: Provide education and appropriate resources to help participant work on and attain dietary goals.;Weight Management/Obesity: Establish reasonable short term and long term weight goals.;Obesity: Provide education and appropriate resources to help participant work on and attain dietary goals.    Admit Weight 262 lb 9.1 oz (119.1 kg)   Goal Weight: Short Term 255 lb (115.7 kg)   Goal Weight: Long Term 250 lb (113.4 kg)   Expected Outcomes Short Term: Continue to assess and modify interventions until short term weight is achieved;Weight Maintenance: Understanding of the daily nutrition guidelines, which includes 25-35% calories from fat, 7% or less cal from saturated fats, less than 200mg  cholesterol, less than 1.5gm of sodium, & 5 or more servings of fruits and vegetables daily;Weight Loss: Understanding of general recommendations for a balanced deficit meal plan, which promotes 1-2 lb weight loss per week and includes a negative energy balance of [phone] kcal/d;Understanding recommendations for meals to include 15-35% energy as protein, 25-35% energy from fat, 35-60% energy from carbohydrates, less than 200mg  of dietary cholesterol, 20-35 gm of total fiber daily;Understanding of distribution of calorie intake throughout the day with the consumption of 4-5 meals/snacks   Heart Failure Yes   Intervention Provide a combined exercise and nutrition program that is supplemented with education, support and counseling about heart failure. Directed toward relieving symptoms such as shortness of breath, decreased exercise tolerance, and extremity edema.   Expected Outcomes Improve functional capacity of life;Short term: Attendance in program 2-3 days a week with increased exercise capacity. Reported lower sodium intake. Reported increased fruit and vegetable intake. Reports medication compliance.;Short term: Daily weights obtained and reported for increase. Utilizing diuretic protocols set by physician.;Long term: Adoption of self-care skills and reduction of barriers for early signs and symptoms recognition and intervention leading to self-care maintenance.   Hypertension Yes   Intervention Provide education on lifestyle modifcations including regular physical activity/exercise, weight management, moderate  sodium restriction and increased consumption of fresh fruit, vegetables, and low fat dairy, alcohol moderation, and smoking cessation.;Monitor prescription use compliance.   Expected Outcomes Short Term: Continued assessment and intervention until BP is < 140/32mm HG in hypertensive participants. < 130/12mm HG in hypertensive participants with diabetes, heart failure or chronic kidney disease.;Long Term: Maintenance of blood pressure at goal levels.   Lipids Yes   Intervention Provide education and support for participant on nutrition & aerobic/resistive exercise along with prescribed medications to achieve LDL 70mg , HDL >40mg .   Expected Outcomes Short Term: Participant states understanding of desired cholesterol values and is compliant with medications prescribed. Participant is following exercise prescription and nutrition guidelines.;Long Term: Cholesterol controlled with medications as prescribed, with individualized exercise RX and with personalized nutrition plan. Value goals: LDL < 70mg , HDL > 40 mg.   Stress Yes   Intervention Offer individual and/or small group education and  counseling on adjustment to heart disease, stress management and health-related lifestyle change. Teach and support self-help strategies.;Refer participants experiencing significant psychosocial distress to appropriate mental health specialists for further evaluation and treatment. When possible, include family members and significant others in education/counseling sessions.   Expected Outcomes Short Term: Participant demonstrates changes in health-related behavior, relaxation and other stress management skills, ability to obtain effective social support, and compliance with psychotropic medications if prescribed.;Long Term: Emotional wellbeing is indicated by absence of clinically significant psychosocial distress or social isolation.      Core Components/Risk Factors/Patient Goals Review:    Core Components/Risk  Factors/Patient Goals at Discharge (Final Review):    ITP Comments:     ITP Comments    Row Name 05/29/17 0800           ITP Comments Medical Director, Dr. Fransico Him          Comments: Patient who is a prior participant in cardiac rehab attended orientation from 0730 to 0930 to review rules and guidelines for program. Completed 6 minute walk test, Intitial ITP, and exercise prescription.  VSS. Telemetry-V pacing with occasional intrinsic beats noted.  Pt did have brief episode of chest pressure at the completion of his last lap during the walk test.  This episode lasted less than 10 seconds and resolved immediately.  Pt s/p TAVR with history of CABG in 2007 who is on imdur. bp 134/70.  Pt able to resume activities for the remaining portion of cardiac rehab including walking back to the [facility] without any difficulty.  Dr. Angelena Form made aware and no further interventions warranted - will continue to monitor. Brief Psychosocial Assessment reveal no immediate barriers to participating in cardiac rehab.  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  Pt is looking forward to returning to cardiac rehab next week.  Cherre Huger, BSN Cardiac and Training and development officer

## 2017-05-29 NOTE — Telephone Encounter (Signed)
-----   Message from Burnell Blanks, MD sent at 05/29/2017 11:52 AM EDT ----- Regarding: RE: Brief chest pressure I would not change anything. Thanks for the update. Gerald Stabs  ----- Message ----- From: Rowe Pavy, RN Sent: 05/29/2017  10:11 AM To: Burnell Blanks, MD Subject: Brief chest pressure                           Dr. Angelena Form, FYI: Pt in today for cardiac rehab orientation and completed a 6 minute walk test for determination of functional capacity.  During the last ap toward the 6 minute mark pt complained of brief chest pressure that lasted less than 10 seconds.  Pt had immediate resolution.  Bp 134/70, monitor showed V pacing.  Pt able to resume the remaining portions of the orientation evaluation with no complaints. Pt denies any other prior episodes since his TAVR.  Pt ambulated from the [place] tower to our department which is about a quarter of mile without any difficulty.  Pt noted to be on Imdur 30 mg daily.  Pt compliant with his medications.  Any other interventions warranted?  Please advise  Cherre Huger, BSN Cardiac and Pulmonary Rehab Nurse Navigator

## 2017-06-02 ENCOUNTER — Encounter (HOSPITAL_COMMUNITY): Payer: Self-pay

## 2017-06-02 ENCOUNTER — Encounter (HOSPITAL_COMMUNITY)
Admission: RE | Admit: 2017-06-02 | Discharge: 2017-06-02 | Disposition: A | Discharge status: Home / Self Care | Payer: Medicare Other | Source: Ambulatory Visit | Attending: Cardiovascular Disease | Admitting: Cardiovascular Disease

## 2017-06-02 DIAGNOSIS — Z952 Presence of prosthetic heart valve: Secondary | ICD-10-CM

## 2017-06-02 DIAGNOSIS — Z48812 Encounter for surgical aftercare following surgery on the circulatory system: Secondary | ICD-10-CM | POA: Diagnosis not present

## 2017-06-02 NOTE — Progress Notes (Signed)
Daily Session Note  Patient Details  Name: Justin [name] MRN: [id number] Date of Birth: [birth year] Referring Provider:     CARDIAC REHAB PHASE II ORIENTATION from 05/29/2017 in [place]  Referring Provider  Darlina Guys      Encounter Date: 06/02/2017  Check In:     Session Check In - 06/02/17 0917      Check-In   Location [facility]   Staff Present Dorna Bloom, MS, ACSM RCEP, Exercise Physiologist;Mariela Rex, RN, Marga Melnick, RN, BSN   Supervising physician immediately available to respond to emergencies [facility] immediately available   Physician(s) Dr. Rodena Piety   Medication changes reported     No   Fall or balance concerns reported    No   Tobacco Cessation No Change   Warm-up and Cool-down Performed as group-led instruction   Resistance Training Performed Yes   VAD Patient? No     Pain Assessment   Currently in Pain? No/denies      Capillary Blood Glucose: No results found for this or any previous visit (from the past 24 hour(s)).    History  Smoking Status  . Former Smoker  . Packs/day: 3.50  . Years: 13.00  . Types: Cigarettes  . Quit date: 1963  Smokeless Tobacco  . Never Used    Goals Met:  Exercise tolerated well  Goals Unmet:  Not Applicable  Comments: Pt started cardiac rehab today.  Pt tolerated light exercise without difficulty. VSS, telemetry-V paced,  asymptomatic.  Medication list reconciled. Pt denies barriers to medicaiton compliance.  PSYCHOSOCIAL ASSESSMENT:  PHQ-0. [name] Pt exhibits positive coping skills, hopeful outlook with supportive family. No psychosocial needs identified at this time, no psychosocial interventions necessary.    Pt enjoys working.  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  Pt also enjoys going on trips with his wife.  Pt goals for cardiac reha are to know his capacity and continue weight loss with personal goal of 250lb.[name]   Pt oriented to exercise equipment and  routine.    Understanding verbalized.   Dr. Fransico Him is Medical Director for Cardiac Rehab at [facility].

## 2017-06-03 DIAGNOSIS — H353132 Nonexudative age-related macular degeneration, bilateral, intermediate dry stage: Secondary | ICD-10-CM | POA: Diagnosis not present

## 2017-06-03 DIAGNOSIS — H43813 Vitreous degeneration, bilateral: Secondary | ICD-10-CM | POA: Diagnosis not present

## 2017-06-03 NOTE — Progress Notes (Signed)
Cardiac Individual Treatment Plan  Patient Details  Name: Justin [name] MRN: [id number] Date of Birth: 1934/03/24 Referring Provider:     CARDIAC REHAB PHASE II ORIENTATION from 05/29/2017 in [place]  Referring Provider  Darlina Guys      Initial Encounter Date:    CARDIAC REHAB PHASE II ORIENTATION from 05/29/2017 in [facility]  Date  05/29/17  Referring Provider  Darlina Guys      Visit Diagnosis: S/P TAVR (transcatheter aortic valve replacement)  Patient's Home Medications on Admission:  Current Outpatient Prescriptions:  .  aspirin 81 MG tablet, Take 1 tablet (81 mg total) by mouth daily., Disp: 30 tablet, Rfl: 0 .  atorvastatin (LIPITOR) 20 MG tablet, TAKE 1 TABLET ONCE DAILY. (Patient taking differently: TAKE 1 TABLET ONCE DAILY IN THE EVENING), Disp: 90 tablet, Rfl: 3 .  benzonatate (TESSALON) 200 MG capsule, Take 1 capsule (200 mg total) by mouth 2 (two) times daily as needed for cough. (Patient taking differently: Take 200 mg by mouth 2 (two) times daily. ), Disp: 60 capsule, Rfl: 5 .  budesonide-formoterol (SYMBICORT) 80-4.5 MCG/ACT inhaler, Inhale 2 puffs into the lungs 2 (two) times daily., Disp: 1 Inhaler, Rfl: 12 .  chlorpheniramine (CHLOR-TRIMETON) 4 MG tablet, Take 8 mg by mouth 2 (two) times daily. , Disp: , Rfl:  .  dabigatran (PRADAXA) 150 MG CAPS capsule, Take 1 capsule (150 mg total) by mouth 2 (two) times daily., Disp: 180 capsule, Rfl: 3 .  DYMISTA 137-50 MCG/ACT SUSP, USE 2 SPRAYS EACH NOSTRIL TWICE DAILY., Disp: 23 g, Rfl: 11 .  furosemide (LASIX) 80 MG tablet, Take 1 tablet (80 mg total) by mouth daily., Disp: 90 tablet, Rfl: 3 .  isosorbide mononitrate (IMDUR) 30 MG 24 hr tablet, Take 1 tablet (30 mg total) by mouth daily., Disp: 90 tablet, Rfl: 3 .  Ketotifen Fumarate (ZADITOR OP), Apply 1 drop to eye daily as needed (dry eyes)., Disp: , Rfl:  .  levothyroxine (SYNTHROID, LEVOTHROID)  75 MCG tablet, Take 1 tablet (75 mcg total) by mouth daily. (Patient taking differently: Take 75 mcg by mouth at bedtime. ), Disp: 90 tablet, Rfl: 3 .  losartan (COZAAR) 25 MG tablet, Take 1 tablet (25 mg total) by mouth daily., Disp: 90 tablet, Rfl: 3 .  metoprolol tartrate (LOPRESSOR) 25 MG tablet, Take 1 tablet (25 mg total) by mouth 2 (two) times daily., Disp: 180 tablet, Rfl: 3 .  montelukast (SINGULAIR) 10 MG tablet, Take 1 tablet (10 mg total) by mouth every evening., Disp: 90 tablet, Rfl: 4 .  Multiple Vitamins-Minerals (PRESERVISION AREDS PO), Take 2 tablets by mouth every morning., Disp: , Rfl:  .  omeprazole (PRILOSEC) 20 MG capsule, Take 1 capsule (20 mg total) by mouth daily., Disp: 90 capsule, Rfl: 4 .  potassium chloride SA (K-DUR,KLOR-CON) 20 MEQ tablet, TAKE (2) TABLETS DAILY., Disp: 180 tablet, Rfl: 3 .  ranitidine (ZANTAC) 150 MG capsule, Take 150 mg by mouth every evening. , Disp: , Rfl:  .  triamcinolone cream (KENALOG) 0.1 %, Apply 1 application topically daily as needed for dry skin., Disp: , Rfl:   Past Medical History: Past Medical History:  Diagnosis Date  . Age-related macular degeneration, dry, both eyes   . Allergic    "24/7; 365 days/year; I'm allergic to pollens, dust, all southern grasses/trees, mold, mildue, cats, dogs" (01/07/2017)  . Asthma    sees [name]   . Benign prostatic hypertrophy    (  sees Dr. Denman George  . CAD (coronary artery disease)    a. s/p CABG 2007. b. Cath 08/2016 - 4/4 patent grafts.  . Carotid bruit    carotid u/s 10/10: 0.39% bilaterally  . Chronic combined systolic and diastolic CHF (congestive heart failure) ([place])   . Complication of anesthesia 1980s   "w/anal cyst OR, he gave me a saddle block then put a narcotic in spinal cord; had a severe reaction to that" (01/07/2017)  . ED (erectile dysfunction)   . Family history of adverse reaction to anesthesia    "daughter wakes up during OR" (01/07/2017)  . GERD (gastroesophageal reflux  disease)   . Gout   . HTN (hypertension)   . Hx of colonic polyps    (sees Dr. Henrene Pastor)  . Hyperlipidemia   . Hypothyroidism   . Moderate to severe aortic stenosis    a. s/p TAVR 02/2017.  [name] Myocardial infarction ([place]) ~ 2000  . Obesity   . Osteoarthritis    "was in my knees, hands" (01/07/2017 )  . PAF (paroxysmal atrial fibrillation) ([place])    a. documented post TAVR.  [name] Precancerous skin lesion    (sees Dr. Allyson Sabal)  . Prostate cancer ([place]) dx'd ~ 2014  . 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 Former Smoker  . Packs/day: 3.50  . Years: 13.00  . Types: Cigarettes  . Quit date: 1963  Smokeless Tobacco  . Never Used    Labs: Recent Review Flowsheet Data    Labs for ITP Cardiac and Pulmonary Rehab Latest Ref Rng & Units 08/27/2016 09/25/2016 02/27/2017 03/04/2017 03/04/2017   Cholestrol 0 - 200 mg/dL - 109 - - -   LDLCALC 0 - 99 mg/dL - 53 - - -   HDL >39.00 mg/dL - 29.80(L) - - -   Trlycerides 0.0 - 149.0 mg/dL - 133.0 - - -   Hemoglobin A1c 4.8 - 5.6 % - - 6.5(H) - -   PHART 7.350 - 7.450 7.437 - 7.475(H) - -   PCO2ART 32.0 - 48.0 mmHg 39.2 - 36.4 - -   HCO3 20.0 - 28.0 mmol/L 26.5 - 26.5 - -   TCO2 0 - 100 mmol/L 28 - - 28 28   O2SAT % 94.0 - 95.0 - -      Capillary Blood Glucose: No results found for: GLUCAP   Exercise Target Goals:    Exercise Program Goal: Individual exercise prescription set with THRR, safety & activity barriers. Participant demonstrates ability to understand and report RPE using BORG scale, to self-measure pulse accurately, and to acknowledge the importance of the exercise prescription.  Exercise Prescription Goal: Starting with aerobic activity 30 plus minutes a day, 3 days per week for initial exercise prescription. Provide home exercise prescription and  guidelines that participant acknowledges understanding prior to discharge.  Activity Barriers & Risk Stratification:     Activity Barriers & Cardiac Risk Stratification - 05/29/17 0819      Activity Barriers & Cardiac Risk Stratification   Activity Barriers Joint Problems;Deconditioning;Muscular Weakness;Incisional Pain;Left Knee Replacement;Right Knee Replacement;Other (comment)   Comments toes straighten, R thumb surgery   Cardiac Risk Stratification High      6 Minute Walk:     6 Minute Walk    Row Name 05/29/17 0820 05/29/17 1015 05/29/17 1033  6 Minute Walk   Phase Initial  -  -   Distance 1372 feet  -  -   Distance Feet Change  - 1372 ft  -   Walk Time 6 minutes  -  -   # of Rest Breaks 0  -  -   MPH  - 2.6  -   METS  - 1.87  -   RPE 11  -  -   VO2 Peak  - 1372  -   Symptoms  - Yes (comment)  -   Comments  - brief chest pressure noted at the end. Resolved at end of walk test  -   Resting HR 89 bpm  -  -   Resting BP 124/60  -  -   Resting Oxygen Saturation  96 %  -  -   Exercise Oxygen Saturation  during 6 min walk 96 %  -  -   Max Ex. HR 111 bpm  -  -   Max Ex. BP 134/64  -  -   2 Minute Post BP  -  - 122/70      Oxygen Initial Assessment:   Oxygen Re-Evaluation:   Oxygen Discharge (Final Oxygen Re-Evaluation):   Initial Exercise Prescription:     Initial Exercise Prescription - 05/29/17 1000      Date of Initial Exercise RX and Referring Provider   Date 05/29/17   Referring Provider Darlina Guys     Treadmill   MPH 1.8   Grade 0   Minutes 10   METs 2.38     Bike   Level --   Minutes --   METs --     Recumbant Bike   Level 1.5   Minutes 10   METs 1.8     NuStep   Level 2   SPM 70   Minutes 10   METs 2     Prescription Details   Frequency (times per week) 3   Duration Progress to 30 minutes of continuous aerobic without signs/symptoms of physical distress     Intensity   THRR 40-80% of Max Heartrate 56-112   Ratings  of Perceived Exertion 11-13   Perceived Dyspnea 0-4     Progression   Progression Continue to progress workloads to maintain intensity without signs/symptoms of physical distress.     Resistance Training   Training Prescription No  recent IDC placement on 05/02/17      Perform Capillary Blood Glucose checks as needed.  Exercise Prescription Changes:     Exercise Prescription Changes    Row Name 06/03/17 1400             Response to Exercise   Blood Pressure (Admit) 106/60       Blood Pressure (Exercise) 124/70       Blood Pressure (Exit) 104/62       Heart Rate (Admit) 97 bpm       Heart Rate (Exercise) 108 bpm       Heart Rate (Exit) 97 bpm       Rating of Perceived Exertion (Exercise) 11       Symptoms none       Comments pt was oriented to exercise equipment on 06/02/17       Duration Continue with 45 min of aerobic exercise without signs/symptoms of physical distress.       Intensity THRR unchanged         Progression   Progression Continue to progress  workloads to maintain intensity without signs/symptoms of physical distress.       Average METs 2.3         Treadmill   MPH 1.8       Grade 0       Minutes 10       METs 2.38         Recumbant Bike   Level 1.5       Minutes 10       METs 1.9         NuStep   Level 2       SPM 80       Minutes 10       METs 2.6          Exercise Comments:     Exercise Comments    Row Name 06/02/17 1423           Exercise Comments Pt was oriented to exercise equipment today. Pt responded well to first exercise session and denied SOB, chesp pain and dizziness with exercise today; will continue to monitor.          Exercise Goals and Review:     Exercise Goals    Row Name 05/29/17 0820             Exercise Goals   Increase Physical Activity Yes       Intervention Provide advice, education, support and counseling about physical activity/exercise needs.;Develop an individualized exercise prescription for  aerobic and resistive training based on initial evaluation findings, risk stratification, comorbidities and participant's personal goals.       Expected Outcomes Achievement of increased cardiorespiratory fitness and enhanced flexibility, muscular endurance and strength shown through measurements of functional capacity and personal statement of participant.       Increase Strength and Stamina Yes  learn activity/exercise limitations       Intervention Provide advice, education, support and counseling about physical activity/exercise needs.;Develop an individualized exercise prescription for aerobic and resistive training based on initial evaluation findings, risk stratification, comorbidities and participant's personal goals.       Expected Outcomes Achievement of increased cardiorespiratory fitness and enhanced flexibility, muscular endurance and strength shown through measurements of functional capacity and personal statement of participant.       Able to understand and use rate of perceived exertion (RPE) scale Yes       Intervention Provide education and explanation on how to use RPE scale       Expected Outcomes Short Term: Able to use RPE daily in rehab to express subjective intensity level;Long Term:  Able to use RPE to guide intensity level when exercising independently       Knowledge and understanding of Target Heart Rate Range (THRR) Yes       Intervention Provide education and explanation of THRR including how the numbers were predicted and where they are located for reference       Expected Outcomes Short Term: Able to state/look up THRR;Long Term: Able to use THRR to govern intensity when exercising independently;Short Term: Able to use daily as guideline for intensity in rehab       Able to check pulse independently Yes       Intervention Provide education and demonstration on how to check pulse in carotid and radial arteries.;Review the importance of being able to check your own pulse  for safety during independent exercise       Expected Outcomes Short Term: Able to explain why pulse checking  is important during independent exercise;Long Term: Able to check pulse independently and accurately       Understanding of Exercise Prescription Yes       Intervention Provide education, explanation, and written materials on patient's individual exercise prescription       Expected Outcomes Short Term: Able to explain program exercise prescription;Long Term: Able to explain home exercise prescription to exercise independently          Exercise Goals Re-Evaluation :     Exercise Goals Re-Evaluation    Row Name 06/03/17 1424 06/03/17 1426           Exercise Goal Re-Evaluation   Exercise Goals Review Able to understand and use rate of perceived exertion (RPE) scale  -      Comments  - Pt demonstated use of RPE scale with exercise.      Expected Outcomes  - Pt will continue to exercise in cardiac rehab safely.           Discharge Exercise Prescription (Final Exercise Prescription Changes):     Exercise Prescription Changes - 06/03/17 1400      Response to Exercise   Blood Pressure (Admit) 106/60   Blood Pressure (Exercise) 124/70   Blood Pressure (Exit) 104/62   Heart Rate (Admit) 97 bpm   Heart Rate (Exercise) 108 bpm   Heart Rate (Exit) 97 bpm   Rating of Perceived Exertion (Exercise) 11   Symptoms none   Comments pt was oriented to exercise equipment on 06/02/17   Duration Continue with 45 min of aerobic exercise without signs/symptoms of physical distress.   Intensity THRR unchanged     Progression   Progression Continue to progress workloads to maintain intensity without signs/symptoms of physical distress.   Average METs 2.3     Treadmill   MPH 1.8   Grade 0   Minutes 10   METs 2.38     Recumbant Bike   Level 1.5   Minutes 10   METs 1.9     NuStep   Level 2   SPM 80   Minutes 10   METs 2.6      Nutrition:  Target Goals: Understanding of  nutrition guidelines, daily intake of sodium 1500mg , cholesterol 200mg , calories 30% from fat and 7% or less from saturated fats, daily to have 5 or more servings of fruits and vegetables.  Biometrics:     Pre Biometrics - 05/29/17 1027      Pre Biometrics   Waist Circumference 51 inches   Hip Circumference 49.5 inches   Waist to Hip Ratio 1.03 %   Triceps Skinfold 29 mm   % Body Fat 40.7 %   Grip Strength 39 kg   Flexibility 7.5 in   Single Leg Stand 4.45 seconds       Nutrition Therapy Plan and Nutrition Goals:   Nutrition Discharge: Nutrition Scores:   Nutrition Goals Re-Evaluation:   Nutrition Goals Re-Evaluation:   Nutrition Goals Discharge (Final Nutrition Goals Re-Evaluation):   Psychosocial: Target Goals: Acknowledge presence or absence of significant depression and/or stress, maximize coping skills, provide positive support system. Participant is able to verbalize types and ability to use techniques and skills needed for reducing stress and depression.  Initial Review & Psychosocial Screening:     Initial Psych Review & Screening - 05/29/17 1253      Barriers   Psychosocial barriers to participate in program The patient should benefit from training in stress management and relaxation.  Screening Interventions   Interventions Encouraged to exercise      Quality of Life Scores:     Quality of Life - 05/29/17 1029      Quality of Life Scores   Health/Function Pre 29.11 %   Socioeconomic Pre 29.38 %   Psych/Spiritual Pre 30 %   Family Pre 30 %   GLOBAL Pre 29.49 %      PHQ-9: Recent Review Flowsheet Data    Depression screen [facility] 2/9 06/02/2017 01/17/2017 12/20/2014 11/12/2013   Decreased Interest 0 0 0 0   Down, Depressed, Hopeless 0 0 0 0   PHQ - 2 Score 0 0 0 0     Interpretation of Total Score  Total Score Depression Severity:  1-4 = Minimal depression, 5-9 = Mild depression, 10-14 = Moderate depression, 15-19 = Moderately severe  depression, 20-27 = Severe depression   Psychosocial Evaluation and Intervention:     Psychosocial Evaluation - 06/02/17 1631      Psychosocial Evaluation & Interventions   Interventions Encouraged to exercise with the program and follow exercise prescription;Stress management education;Relaxation education   Comments no psychosocial needs identified, no interventions necessary    Expected Outcomes pt will exhibit positive outlook with good coping skills.   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 otherwise no psychosocial needs identified, no interventions necessary       Expected Outcomes pt will exhibit positive outlook with good coping skills.        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 otherwise no psychosocial needs identified, no interventions necessary   Expected Outcomes pt will exhibit positive outlook with good coping skills.    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   Vocational Rehab Evaluation & Intervention:     Vocational Rehab - 05/29/17 1225      Initial Vocational Rehab Evaluation & Intervention   Assessment shows need for Vocational Rehabilitation No      Education: Education Goals: Education classes will be provided on a weekly basis, covering required topics. Participant will state understanding/return demonstration of topics presented.  Learning Barriers/Preferences:     Learning Barriers/Preferences - 05/29/17 1030      Learning Barriers/Preferences  Learning Barriers Sight      Education Topics: Count Your Pulse:  -Group instruction provided by verbal instruction, demonstration, patient participation and written materials to support subject.  Instructors address importance of being able to find your pulse and how to count your pulse when at home without a heart monitor.  Patients get hands on experience counting their pulse with staff help and individually.   Heart Attack, Angina, and Risk Factor Modification:  -Group instruction provided by verbal instruction, video, and written materials to support subject.  Instructors address signs and symptoms of angina and heart attacks.    Also discuss risk factors for heart disease and how to make changes to improve heart health risk factors.   Functional Fitness:  -Group instruction provided by verbal instruction, demonstration, patient participation, and written materials to support subject.  Instructors address safety measures for doing things around the house.  Discuss how to get up and down off the floor, how to pick things up properly, how to safely get out of a chair without assistance, and balance training.   Meditation and Mindfulness:  -Group instruction provided by verbal  instruction, patient participation, and written materials to support subject.  Instructor addresses importance of mindfulness and meditation practice to help reduce stress and improve awareness.  Instructor also leads participants through a meditation exercise.    Stretching for Flexibility and Mobility:  -Group instruction provided by verbal instruction, patient participation, and written materials to support subject.  Instructors lead participants through series of stretches that are designed to increase flexibility thus improving mobility.  These stretches are additional exercise for major muscle groups that are typically performed during regular warm up and cool down.   Hands Only CPR:  -Group verbal, video, and participation provides a basic overview of AHA guidelines for community CPR. Role-play of emergencies allow participants the opportunity to practice calling for help and chest compression technique with discussion of AED use.   Hypertension: -Group verbal and written instruction that provides a basic overview of hypertension including the most recent diagnostic guidelines, risk factor reduction with self-care instructions and medication management.    Nutrition I class: Heart Healthy Eating:  -Group instruction provided by PowerPoint slides, verbal discussion, and written materials to support subject matter. The instructor gives an explanation and review of the Therapeutic Lifestyle Changes diet recommendations, which includes a discussion on lipid goals, dietary fat, sodium, fiber, plant stanol/sterol esters, sugar, and the components of a well-balanced, healthy diet.   Nutrition II class: Lifestyle Skills:  -Group instruction provided by PowerPoint slides, verbal discussion, and written materials to support subject matter. The instructor gives an explanation and review of label reading, grocery shopping for heart health, heart healthy recipe modifications, and ways to make  healthier choices when eating out.   Diabetes Question & Answer:  -Group instruction provided by PowerPoint slides, verbal discussion, and written materials to support subject matter. The instructor gives an explanation and review of diabetes co-morbidities, pre- and post-prandial blood glucose goals, pre-exercise blood glucose goals, signs, symptoms, and treatment of hypoglycemia and hyperglycemia, and foot care basics.   Diabetes Blitz:  -Group instruction provided by PowerPoint slides, verbal discussion, and written materials to support subject matter. The instructor gives an explanation and review of the physiology behind type 1 and type 2 diabetes, diabetes medications and rational behind using different medications, pre- and post-prandial blood glucose recommendations and Hemoglobin A1c goals, diabetes diet, and exercise including blood glucose guidelines for exercising safely.    Portion  Distortion:  -Group instruction provided by PowerPoint slides, verbal discussion, written materials, and food models to support subject matter. The instructor gives an explanation of serving size versus portion size, changes in portions sizes over the last 20 years, and what consists of a serving from each food group.   Stress Management:  -Group instruction provided by verbal instruction, video, and written materials to support subject matter.  Instructors review role of stress in heart disease and how to cope with stress positively.     Exercising on Your Own:  -Group instruction provided by verbal instruction, power point, and written materials to support subject.  Instructors discuss benefits of exercise, components of exercise, frequency and intensity of exercise, and end points for exercise.  Also discuss use of nitroglycerin and activating EMS.  Review options of places to exercise outside of rehab.  Review guidelines for sex with heart disease.   Cardiac Drugs I:  -Group instruction provided by  verbal instruction and written materials to support subject.  Instructor reviews cardiac drug classes: antiplatelets, anticoagulants, beta blockers, and statins.  Instructor discusses reasons, side effects, and lifestyle considerations for each drug class.   Cardiac Drugs II:  -Group instruction provided by verbal instruction and written materials to support subject.  Instructor reviews cardiac drug classes: angiotensin converting enzyme inhibitors (ACE-I), angiotensin II receptor blockers (ARBs), nitrates, and calcium channel blockers.  Instructor discusses reasons, side effects, and lifestyle considerations for each drug class.   Anatomy and Physiology of the Circulatory System:  Group verbal and written instruction and models provide basic cardiac anatomy and physiology, with the coronary electrical and arterial systems. Review of: AMI, Angina, Valve disease, Heart Failure, Peripheral Artery Disease, Cardiac Arrhythmia, Pacemakers, and the ICD.   Other Education:  -Group or individual verbal, written, or video instructions that support the educational goals of the cardiac rehab program.   Knowledge Questionnaire Score:     Knowledge Questionnaire Score - 05/29/17 1030      Knowledge Questionnaire Score   Pre Score 22/24      Core Components/Risk Factors/Patient Goals at Admission:     Personal Goals and Risk Factors at Admission - 06/02/17 0922      Core Components/Risk Factors/Patient Goals on Admission    Weight Management Yes;Weight Maintenance;Weight Loss;Obesity   Intervention Weight Management: Develop a combined nutrition and exercise program designed to reach desired caloric intake, while maintaining appropriate intake of nutrient and fiber, sodium and fats, and appropriate energy expenditure required for the weight goal.   Admit Weight 262 lb 9.1 oz (119.1 kg)   Goal Weight: Short Term 255 lb (115.7 kg)   Goal Weight: Long Term 250 lb (113.4 kg)   Expected Outcomes  Short Term: Continue to assess and modify interventions until short term weight is achieved;Weight Maintenance: Understanding of the daily nutrition guidelines, which includes 25-35% calories from fat, 7% or less cal from saturated fats, less than 200mg  cholesterol, less than 1.5gm of sodium, & 5 or more servings of fruits and vegetables daily;Weight Loss: Understanding of general recommendations for a balanced deficit meal plan, which promotes 1-2 lb weight loss per week and includes a negative energy balance of [phone] kcal/d;Understanding recommendations for meals to include 15-35% energy as protein, 25-35% energy from fat, 35-60% energy from carbohydrates, less than 200mg  of dietary cholesterol, 20-35 gm of total fiber daily;Understanding of distribution of calorie intake throughout the day with the consumption of 4-5 meals/snacks   Intervention Provide a combined exercise and nutrition program that is  supplemented with education, support and counseling about heart failure. Directed toward relieving symptoms such as shortness of breath, decreased exercise tolerance, and extremity edema.   Expected Outcomes Improve functional capacity of life;Short term: Attendance in program 2-3 days a week with increased exercise capacity. Reported lower sodium intake. Reported increased fruit and vegetable intake. Reports medication compliance.;Short term: Daily weights obtained and reported for increase. Utilizing diuretic protocols set by physician.;Long term: Adoption of self-care skills and reduction of barriers for early signs and symptoms recognition and intervention leading to self-care maintenance.   Hypertension Yes   Intervention Provide education on lifestyle modifcations including regular physical activity/exercise, weight management, moderate sodium restriction and increased consumption of fresh fruit, vegetables, and low fat dairy, alcohol moderation, and smoking cessation.;Monitor prescription use  compliance.   Expected Outcomes Short Term: Continued assessment and intervention until BP is < 140/53mm HG in hypertensive participants. < 130/61mm HG in hypertensive participants with diabetes, heart failure or chronic kidney disease.;Long Term: Maintenance of blood pressure at goal levels.   Lipids Yes   Intervention Provide education and support for participant on nutrition & aerobic/resistive exercise along with prescribed medications to achieve LDL 70mg , HDL >40mg .   Expected Outcomes Short Term: Participant states understanding of desired cholesterol values and is compliant with medications prescribed. Participant is following exercise prescription and nutrition guidelines.;Long Term: Cholesterol controlled with medications as prescribed, with individualized exercise RX and with personalized nutrition plan. Value goals: LDL < 70mg , HDL > 40 mg.   Stress Yes   Intervention Offer individual and/or small group education and counseling on adjustment to heart disease, stress management and health-related lifestyle change. Teach and support self-help strategies.;Refer participants experiencing significant psychosocial distress to appropriate mental health specialists for further evaluation and treatment. When possible, include family members and significant others in education/counseling sessions.   Expected Outcomes Short Term: Participant demonstrates changes in health-related behavior, relaxation and other stress management skills, ability to obtain effective social support, and compliance with psychotropic medications if prescribed.;Long Term: Emotional wellbeing is indicated by absence of clinically significant psychosocial distress or social isolation.   Personal Goal Other Yes   Personal Goal pt personal goals for cardaic rehab are to learn safe, appropriate activity parameters.  pt would like to continue weight loss until goal wt 250lb achieved.    Intervention provide exercise, nutrition and  lifestyle modification education to promote safe exercise, activity levels and weight loss goals.     Expected Outcomes pt wil participate in CR exercise, nutrition and lifestyle modification education opportunities      Core Components/Risk Factors/Patient Goals Review:      Goals and Risk Factor Review    Row Name 06/03/17 1641             Core Components/Risk Factors/Patient Goals Review   Personal Goals Review Weight Management/Obesity;Heart Failure;Hypertension;Lipids;Stress       Review pt with multiple CAD RF demonstrates eagerness to participate in CR.       Expected Outcomes pt will participate in CR exercise, nutrition and lifestyle modification opportunities.           Core Components/Risk Factors/Patient Goals at Discharge (Final Review):      Goals and Risk Factor Review - 06/03/17 1641      Core Components/Risk Factors/Patient Goals Review   Personal Goals Review Weight Management/Obesity;Heart Failure;Hypertension;Lipids;Stress   Review pt with multiple CAD RF demonstrates eagerness to participate in CR.   Expected Outcomes pt will participate in CR exercise, nutrition and lifestyle modification  opportunities.       ITP Comments:     ITP Comments    Row Name 05/29/17 0800 06/03/17 1639         ITP Comments Medical Director, Dr. Fransico Him 30day ITP review.  pt started group exercise 06/02/2017.  pt demonstrates eagerness to participate in program         Comments:

## 2017-06-04 ENCOUNTER — Encounter (HOSPITAL_COMMUNITY)
Admission: RE | Admit: 2017-06-04 | Discharge: 2017-06-04 | Disposition: A | Discharge status: Home / Self Care | Payer: Medicare Other | Source: Ambulatory Visit | Attending: Cardiovascular Disease | Admitting: Cardiovascular Disease

## 2017-06-04 DIAGNOSIS — Z952 Presence of prosthetic heart valve: Secondary | ICD-10-CM

## 2017-06-04 DIAGNOSIS — Z48812 Encounter for surgical aftercare following surgery on the circulatory system: Secondary | ICD-10-CM | POA: Diagnosis not present

## 2017-06-04 NOTE — Progress Notes (Signed)
Justin [name] 83 y.o. male DOB 18-May-1934 MRN [id number]       Nutrition: Brief Note  1. S/P TAVR (transcatheter aortic valve replacement)   2.      S/P ICD  Past Medical History:  Diagnosis Date  . Age-related macular degeneration, dry, both eyes   . Allergic    "24/7; 365 days/year; I'm allergic to pollens, dust, all southern grasses/trees, mold, mildue, cats, dogs" (01/07/2017)  . Asthma    sees [name]   . Benign prostatic hypertrophy    (sees Dr. Denman George  . CAD (coronary artery disease)    a. s/p CABG 2007. b. Cath 08/2016 - 4/4 patent grafts.  . Carotid bruit    carotid u/s 10/10: 0.39% bilaterally  . Chronic combined systolic and diastolic CHF (congestive heart failure) ([place])   . Complication of anesthesia 1980s   "w/anal cyst OR, he gave me a saddle block then put a narcotic in spinal cord; had a severe reaction to that" (01/07/2017)  . ED (erectile dysfunction)   . Family history of adverse reaction to anesthesia    "daughter wakes up during OR" (01/07/2017)  . GERD (gastroesophageal reflux disease)   . Gout   . HTN (hypertension)   . Hx of colonic polyps    (sees Dr. Henrene Pastor)  . Hyperlipidemia   . Hypothyroidism   . Moderate to severe aortic stenosis    a. s/p TAVR 02/2017.  [name] Myocardial infarction ([place]) ~ 2000  . Obesity   . Osteoarthritis    "was in my knees, hands" (01/07/2017 )  . PAF (paroxysmal atrial fibrillation) ([place])    a. documented post TAVR.  [name] Precancerous skin lesion    (sees Dr. Allyson Sabal)  . Prostate cancer ([place]) dx'd ~ 2014  . 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   29 mm Edwards Sapien 3 transcatheter heart valve placed via percutaneous right transfemoral approach   Meds reviewed.   HT: Ht Readings from Last 1 Encounters:  05/29/17 5' 7.75" (1.721 m)    WT: Wt Readings from Last 3 Encounters:  05/29/17 262 lb 9.1 oz (119.1 kg)  05/27/17 262 lb (118.8 kg)  05/15/17 260 lb (117.9 kg)      BMI 40.2   Current tobacco use? No    Labs:  Lipid Panel     Component Value Date/Time   CHOL 109 09/25/2016 0828   TRIG 133.0 09/25/2016 0828   TRIG 143 06/30/2006 1132   HDL 29.80 (L) 09/25/2016 0828   CHOLHDL 4 09/25/2016 0828   VLDL 26.6 09/25/2016 0828   LDLCALC 53 09/25/2016 0828    Lab Results  Component Value Date   HGBA1C 6.5 (H) 02/27/2017   CBG (last 3)  No results for input(s): GLUCAP in the last 72 hours.  Nutrition Diagnosis ? Food-and nutrition-related knowledge deficit related to lack of exposure to information as related to diagnosis of: ? CVD ? Hyperglycemia  ? Obesity related to excessive energy intake as evidenced by a BMI of 40.2  Nutrition Goal(s):  ? Pt to identify food quantities necessary to achieve weight loss of 6-24 lb (2.7-10.9 kg) at graduation from cardiac rehab. ? Pt able to name foods that affect blood glucose  Plan:  Pt to attend nutrition classes ? Nutrition I ? Nutrition II ? Portion Distortion ? Diabetes Q & A Will provide client-centered nutrition education as part of interdisciplinary care.   Monitor and evaluate progress toward nutrition goal  with team.  Derek Mound, M.Ed, RD, LDN, CDE 06/04/2017 9:02 AM

## 2017-06-05 ENCOUNTER — Ambulatory Visit (HOSPITAL_COMMUNITY)
Admission: RE | Admit: 2017-06-05 | Discharge: 2017-06-05 | Disposition: A | Discharge status: Home / Self Care | Payer: Medicare Other | Source: Ambulatory Visit | Attending: Cardiovascular Disease | Admitting: Cardiovascular Disease

## 2017-06-05 DIAGNOSIS — M7989 Other specified soft tissue disorders: Secondary | ICD-10-CM | POA: Insufficient documentation

## 2017-06-06 ENCOUNTER — Telehealth: Payer: Self-pay | Admitting: Family Medicine

## 2017-06-06 ENCOUNTER — Encounter (HOSPITAL_COMMUNITY)
Admission: RE | Admit: 2017-06-06 | Discharge: 2017-06-06 | Disposition: A | Discharge status: Home / Self Care | Payer: Medicare Other | Source: Ambulatory Visit | Attending: Cardiovascular Disease | Admitting: Cardiovascular Disease

## 2017-06-06 DIAGNOSIS — Z952 Presence of prosthetic heart valve: Secondary | ICD-10-CM | POA: Diagnosis not present

## 2017-06-06 DIAGNOSIS — M7989 Other specified soft tissue disorders: Secondary | ICD-10-CM

## 2017-06-06 DIAGNOSIS — Z48812 Encounter for surgical aftercare following surgery on the circulatory system: Secondary | ICD-10-CM | POA: Diagnosis not present

## 2017-06-06 NOTE — Telephone Encounter (Signed)
Pt wanted to say that arm is still swollen and has blood in his right nostril every morning. Please advise?

## 2017-06-09 ENCOUNTER — Encounter (HOSPITAL_COMMUNITY)
Admission: RE | Admit: 2017-06-09 | Discharge: 2017-06-09 | Disposition: A | Discharge status: Home / Self Care | Payer: Medicare Other | Source: Ambulatory Visit | Attending: Cardiovascular Disease | Admitting: Cardiovascular Disease

## 2017-06-09 DIAGNOSIS — Z952 Presence of prosthetic heart valve: Secondary | ICD-10-CM

## 2017-06-09 DIAGNOSIS — Z48812 Encounter for surgical aftercare following surgery on the circulatory system: Secondary | ICD-10-CM | POA: Diagnosis not present

## 2017-06-09 NOTE — Progress Notes (Signed)
Reviewed home exercise with pt today.  Pt plans to walk indoors at local shopping stores for exercise.  Reviewed THR, pulse, RPE, sign and symptoms, and when to call 911 or MD.  Also discussed weather considerations and indoor options.  Pt voiced understanding.    Justin [name] [employer]

## 2017-06-09 NOTE — Telephone Encounter (Signed)
I have referred him to see Dr. Alphonzo Severance (Orthopedics) for this

## 2017-06-10 NOTE — Telephone Encounter (Signed)
I spoke with pt and gave information about referral, also Dr. Sarajane Jews recommended trying nasal spray for nose.

## 2017-06-11 ENCOUNTER — Encounter (HOSPITAL_COMMUNITY)
Admission: RE | Admit: 2017-06-11 | Discharge: 2017-06-11 | Disposition: A | Discharge status: Home / Self Care | Payer: Medicare Other | Source: Ambulatory Visit | Attending: Cardiovascular Disease | Admitting: Cardiovascular Disease

## 2017-06-11 DIAGNOSIS — Z952 Presence of prosthetic heart valve: Secondary | ICD-10-CM

## 2017-06-11 DIAGNOSIS — Z48812 Encounter for surgical aftercare following surgery on the circulatory system: Secondary | ICD-10-CM | POA: Diagnosis not present

## 2017-06-13 ENCOUNTER — Encounter (HOSPITAL_COMMUNITY)
Admission: RE | Admit: 2017-06-13 | Discharge: 2017-06-13 | Disposition: A | Discharge status: Home / Self Care | Payer: Medicare Other | Source: Ambulatory Visit | Attending: Cardiovascular Disease | Admitting: Cardiovascular Disease

## 2017-06-13 DIAGNOSIS — Z952 Presence of prosthetic heart valve: Secondary | ICD-10-CM

## 2017-06-13 DIAGNOSIS — Z48812 Encounter for surgical aftercare following surgery on the circulatory system: Secondary | ICD-10-CM | POA: Diagnosis not present

## 2017-06-16 ENCOUNTER — Encounter: Payer: Medicare Other | Admitting: Adult Health

## 2017-06-16 ENCOUNTER — Encounter (HOSPITAL_COMMUNITY)
Admission: RE | Admit: 2017-06-16 | Discharge: 2017-06-16 | Disposition: A | Discharge status: Home / Self Care | Payer: Medicare Other | Source: Ambulatory Visit | Attending: Cardiovascular Disease | Admitting: Cardiovascular Disease

## 2017-06-16 DIAGNOSIS — Z952 Presence of prosthetic heart valve: Secondary | ICD-10-CM

## 2017-06-16 DIAGNOSIS — Z48812 Encounter for surgical aftercare following surgery on the circulatory system: Secondary | ICD-10-CM | POA: Diagnosis not present

## 2017-06-18 ENCOUNTER — Telehealth: Payer: Self-pay | Admitting: Pulmonary Disease

## 2017-06-18 ENCOUNTER — Encounter (HOSPITAL_COMMUNITY)
Admission: RE | Admit: 2017-06-18 | Discharge: 2017-06-18 | Disposition: A | Discharge status: Home / Self Care | Payer: Medicare Other | Source: Ambulatory Visit | Attending: Cardiovascular Disease | Admitting: Cardiovascular Disease

## 2017-06-18 DIAGNOSIS — Z48812 Encounter for surgical aftercare following surgery on the circulatory system: Secondary | ICD-10-CM | POA: Diagnosis not present

## 2017-06-18 DIAGNOSIS — Z952 Presence of prosthetic heart valve: Secondary | ICD-10-CM

## 2017-06-18 NOTE — Telephone Encounter (Signed)
Letter left in BQ's look-at cubby for review.

## 2017-06-18 NOTE — Telephone Encounter (Signed)
BQ let us know when you complete forms. Thanks.

## 2017-06-18 NOTE — Telephone Encounter (Signed)
?   What forms

## 2017-06-19 ENCOUNTER — Ambulatory Visit (INDEPENDENT_AMBULATORY_CARE_PROVIDER_SITE_OTHER): Payer: Medicare Other | Admitting: Orthopedic Surgery

## 2017-06-19 ENCOUNTER — Encounter (INDEPENDENT_AMBULATORY_CARE_PROVIDER_SITE_OTHER): Payer: Self-pay | Admitting: Orthopedic Surgery

## 2017-06-19 DIAGNOSIS — M25532 Pain in left wrist: Secondary | ICD-10-CM | POA: Diagnosis not present

## 2017-06-20 ENCOUNTER — Encounter (HOSPITAL_COMMUNITY)
Admission: RE | Admit: 2017-06-20 | Discharge: 2017-06-20 | Disposition: A | Discharge status: Home / Self Care | Payer: Medicare Other | Source: Ambulatory Visit | Attending: Cardiovascular Disease | Admitting: Cardiovascular Disease

## 2017-06-20 DIAGNOSIS — Z952 Presence of prosthetic heart valve: Secondary | ICD-10-CM

## 2017-06-20 DIAGNOSIS — Z48812 Encounter for surgical aftercare following surgery on the circulatory system: Secondary | ICD-10-CM | POA: Diagnosis not present

## 2017-06-20 NOTE — Progress Notes (Signed)
Justin [name] 83 y.o. male DOB 05/28/1934 MRN [id number]       Nutrition Note  Dx: TAVR, CHF, S/P ICD  Lab Results  Component Value Date   HGBA1C 6.5 (H) 02/27/2017   Note Spoke with pt. Nutrition Plan and Nutrition Survey goals reviewed with pt. Pt is following Step 2 of the Therapeutic Lifestyle Changes diet. Pt wants to lose wt. Pt states his wt has been stable since admission. Wt loss tips reviewed.  Pt with hyperglycemia. Pt with dx of CHF. Per discussion, pt does tries to monitor sodium consumption, but continues to eat out frequently "due to work." Pt expressed understanding of the information reviewed. Pt aware of nutrition education classes offered.  Nutrition Diagnosis ? Food-and nutrition-related knowledge deficit related to lack of exposure to information as related to diagnosis of: ? CVD ? Hyperglycemia  ? Obesity related to excessive energy intake as evidenced by a BMI of 40.2  Nutrition Intervention ? Pt's individual nutrition plan reviewed with pt. ? Benefits of adopting Therapeutic Lifestyle Changes discussed when Medficts reviewed.     Nutrition Goal(s):  ? Pt to identify food quantities necessary to achieve weight loss of 6-24 lb (2.7-10.9 kg) at graduation from cardiac rehab. ? Pt able to name foods that affect blood glucose  Plan:  Pt to attend nutrition classes ? Nutrition I                                       ? Nutrition II - met 06/10/17                                                                 ? Portion Distortion - met 06/11/17 ? Diabetes Q & A - met 06/06/17 Will provide client-centered nutrition education as part of interdisciplinary care.   Monitor and evaluate progress toward nutrition goal with team.  Edna Franko, M.Ed, RD, LDN, CDE 06/20/2017 9:17 AM 

## 2017-06-20 NOTE — Progress Notes (Signed)
Office Visit Note   Patient: Justin [name]           Date of Birth: [birth year]           MRN: [id number] Visit Date: 06/19/2017 Requested by: Laurey Morale, MD [place], [place] [address] PCP: Laurey Morale, MD  Subjective: Chief Complaint  Patient presents with  . left arm swelling    HPI: When is an 83 year old patient with left arm swelling for the past 3 months.  He is on protected accident which is a blood thinner.  He has had an ultrasound of his left arm which is been negative.  Since I have last seen him he has had a heart valve placed along with a pacemaker and defibrillator placed which is on that left hand side.  He was treated for congestive heart failure March 10 and is lost about 20 pounds in water weight since that time.  Denies any history of injury but just reports that his left arm is more swollen than the right arm.              ROS: All systems reviewed are negative as they relate to the chief complaint within the history of present illness.  Patient denies  fevers or chills.   Assessment & Plan: Visit Diagnoses:  1. Pain in left wrist     Plan: Impression is left arm swelling with general high-level pitting edema in the arm on the left compared to the right.  Bilateral lower chemise also have some degree of pitting edema so he has some fluid retention in general.  I think is more prominent on that left hand side because of multiple IVs vitamin place they're along with potential interruption of venous return where the pacemaker was placed.  I don't think any intervention is required especially in light of the fact that ultrasound was negative for any type of deep vein thrombosis.  This is not rising to the degree that he would like to have any type of sleeve or compression device placed.  I'll see him back as needed  Follow-Up Instructions: Return if symptoms worsen or fail to improve.   Orders:  No orders of the defined types were placed  in this encounter.  No orders of the defined types were placed in this encounter.     Procedures: No procedures performed   Clinical Data: No additional findings.  Objective: Vital Signs: There were no vitals taken for this visit.  Physical Exam:   Constitutional: Patient appears well-developed HEENT:  Head: Normocephalic Eyes:EOM are normal Neck: Normal range of motion Cardiovascular: Normal rate Pulmonary/chest: Effort normal Neurologic: Patient is alert Skin: Skin is warm Psychiatric: Patient has normal mood and affect    Ortho Exam: Orthopedic exam demonstrates 5 out of 5 grip EPL FPL interosseous wrist flexion-extension biceps triceps and deltoid strength.  Does have a little bit more edema in the left arm compared to the right.  Shoulder examination is normal.  He does have bilateral pitting edema in the legs 2+.  Radial pulses intact in the left arm and right arm.  Elbow range of motion is full left and right arm.  No lymphadenopathy present in the axillary region.  No masses in this area.  Specialty Comments:  No specialty comments available.  Imaging: No results found.   PMFS History: Patient Active Problem List   Diagnosis Date Noted  . S/P ICD (internal cardiac defibrillator) procedure 05/03/2017  . NICM (  nonischemic cardiomyopathy) ([place]) 05/02/2017  . 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 Severe aortic valve stenosis 03/04/2017  . Hypokalemia due to loss of potassium with diuresis 01/10/2017  . Aortic stenosis 01/10/2017  . Systolic heart failure ([place]) 01/10/2017  . Acute on chronic diastolic CHF (congestive heart failure) ([place]) 01/07/2017  . Hypothyroidism 12/23/2016  . Prostate cancer ([place])   . 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 Edema of both legs 09/01/2014  . Asthma, intermittent 09/01/2014  . Aortic valve disorders 01/01/2011  . Coronary artery disease involving native coronary artery of native heart without  angina pectoris   . ACUTE BRONCHITIS 09/06/2010  . VIRAL URI 08/24/2010  . GOUT 09/12/2009  . TINEA PEDIS 08/15/2009  . CELLULITIS, FOOT 08/15/2009  . CAD, AUTOLOGOUS BYPASS GRAFT 05/10/2009  . MURMUR 05/08/2009  . BRUIT 05/08/2009  . MUSCLE CRAMPS 12/12/2008  . KNEE SPRAIN 09/21/2008  . BENIGN PROSTATIC HYPERTROPHY 07/07/2008  . OSTEOARTHRITIS 07/07/2008  . CONTUSION, HIP 05/13/2008  . BENIGN PROSTATIC HYPERTROPHY, WITH OBSTRUCTION 02/01/2008  . LOW BACK PAIN SYNDROME 02/01/2008  . TESTOSTERONE DEFICIENCY 07/03/2007  . HYPERLIPIDEMIA 07/03/2007  . Essential hypertension 07/03/2007  . MYOCARDIAL INFARCTION, HX OF 07/03/2007  . Allergic rhinitis 07/03/2007  . ACTINIC KERATOSIS 07/03/2007  . COLONIC POLYPS, HX OF 07/03/2007  . ACNE ROSACEA, HX OF 07/03/2007  . 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   Past Medical History:  Diagnosis Date  . Age-related macular degeneration, dry, both eyes   . Allergic    "24/7; 365 days/year; I'm allergic to pollens, dust, all southern grasses/trees, mold, mildue, cats, dogs" (01/07/2017)  . Asthma    sees [name]   . Benign prostatic hypertrophy    (sees Dr. Denman George  . CAD (coronary artery disease)    a. s/p CABG 2007. b. Cath 08/2016 - 4/4 patent grafts.  . Carotid bruit    carotid u/s 10/10: 0.39% bilaterally  . Chronic combined systolic and diastolic CHF (congestive heart failure) ([place])   . Complication of anesthesia 1980s   "w/anal cyst OR, he gave me a saddle block then put a narcotic in spinal cord; had a severe reaction to that" (01/07/2017)  . ED (erectile dysfunction)   . Family history of adverse reaction to anesthesia    "daughter wakes up during OR" (01/07/2017)  . GERD (gastroesophageal reflux disease)   . Gout   . HTN (hypertension)   . Hx of colonic polyps    (sees Dr. Henrene Pastor)  . Hyperlipidemia   . Hypothyroidism   . Moderate to severe aortic stenosis    a. s/p TAVR 02/2017.  [name] Myocardial infarction ([place]) ~ 2000  . Obesity   .  Osteoarthritis    "was in my knees, hands" (01/07/2017 )  . PAF (paroxysmal atrial fibrillation) ([place])    a. documented post TAVR.  [name] Precancerous skin lesion    (sees Dr. Allyson Sabal)  . Prostate cancer ([place]) dx'd ~ 2014  . 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 Heart attack Father 24  . Heart failure Mother 17  . Uterine cancer Mother     Past Surgical History:  Procedure Laterality Date  . ANUS SURGERY     "opened it back up cause it wouldn't heal; wound up w/a fissure" (01/07/2017)  . BIV  ICD INSERTION CRT-D N/A 05/02/2017   Procedure: BIV ICD INSERTION CRT-D;  Surgeon: Deboraha Sprang, MD;  Location: [facility];  Service: Cardiovascular;  Laterality: N/A;  . CARDIAC CATHETERIZATION  10/29/2005  . CARDIAC CATHETERIZATION N/A 08/27/2016   Procedure: Right/Left Heart Cath and Coronary/Graft Angiography;  Surgeon: Burnell Blanks, MD;  Location: [facility];  Service: Cardiovascular;  Laterality: N/A;  . CATARACT EXTRACTION W/ INTRAOCULAR LENS  IMPLANT, BILATERAL Bilateral   . CATARACT EXTRACTION, BILATERAL  2012  . COLONOSCOPY  06/30/2008   no repeats needed   . CORONARY ARTERY BYPASS GRAFT  2007   "CABG X4"  . CYST EXCISION PERINEAL  1980s  . HAMMER TOE SURGERY Bilateral   . JOINT REPLACEMENT    . KNEE ARTHROPLASTY  07/30/2011   Procedure: COMPUTER ASSISTED TOTAL KNEE ARTHROPLASTY;  Surgeon: Meredith Pel;  Location: [facility];  Service: Orthopedics;  Laterality: Left;  left total knee arthroplasty  . MASTECTOMY SUBCUTANEOUS Bilateral   . ORIF FINGER / THUMB FRACTURE Right ~ 1980   "repair of thumb injury"  . PROSTATE BIOPSY    . REPLACEMENT TOTAL KNEE BILATERAL Bilateral 2012  . TEE WITHOUT CARDIOVERSION N/A 03/04/2017   Procedure: TRANSESOPHAGEAL ECHOCARDIOGRAM  (TEE);  Surgeon: Burnell Blanks, MD;  Location: [facility];  Service: Open Heart Surgery;  Laterality: N/A;  . TRANSCATHETER AORTIC VALVE REPLACEMENT, TRANSFEMORAL N/A 03/04/2017   Procedure: TRANSCATHETER AORTIC VALVE REPLACEMENT, TRANSFEMORAL;  Surgeon: Burnell Blanks, MD;  Location: [facility];  Service: Open Heart Surgery;  Laterality: N/A;   Social History   Occupational History  . general contractor     builds malls   Social History Main Topics  . Smoking status: Former Smoker    Packs/day: 3.50    Years: 13.00    Types: Cigarettes    Quit date: 1963  . Smokeless tobacco: Never Used  . Alcohol use 0.0 oz/week     Comment: 01/07/2017 "might have 2 beers 2-3 times/month"  . Drug use: No  . Sexual activity: No

## 2017-06-23 ENCOUNTER — Ambulatory Visit (INDEPENDENT_AMBULATORY_CARE_PROVIDER_SITE_OTHER): Payer: Medicare Other | Admitting: Adult Health

## 2017-06-23 ENCOUNTER — Encounter (HOSPITAL_COMMUNITY)
Admission: RE | Admit: 2017-06-23 | Discharge: 2017-06-23 | Disposition: A | Discharge status: Home / Self Care | Payer: Medicare Other | Source: Ambulatory Visit | Attending: Cardiovascular Disease | Admitting: Cardiovascular Disease

## 2017-06-23 ENCOUNTER — Encounter: Payer: Self-pay | Admitting: Adult Health

## 2017-06-23 DIAGNOSIS — J4520 Mild intermittent asthma, uncomplicated: Secondary | ICD-10-CM | POA: Diagnosis not present

## 2017-06-23 DIAGNOSIS — Z952 Presence of prosthetic heart valve: Secondary | ICD-10-CM | POA: Diagnosis not present

## 2017-06-23 DIAGNOSIS — J302 Other seasonal allergic rhinitis: Secondary | ICD-10-CM | POA: Diagnosis not present

## 2017-06-23 DIAGNOSIS — Z48812 Encounter for surgical aftercare following surgery on the circulatory system: Secondary | ICD-10-CM | POA: Diagnosis not present

## 2017-06-23 NOTE — Progress Notes (Signed)
Reviewed, agree 

## 2017-06-23 NOTE — Patient Instructions (Addendum)
Restart Zyrtec 10mg  daily in am .  Continue on Symbicort 80/4.52 puffs twice a day Keep taking Dymista Take Chlor-Trimeton At bedtime  .  May use saline spray and gel As needed  Nasal congestion   Keep taking Zantac in the evenings Keep  taking Prilosec in the mornings  Follow up with [name] in Dec as planned and As needed

## 2017-06-23 NOTE — Progress Notes (Signed)
@Patient  ID: Justin [name], male    DOB: [birth year], 83 y.o.   MRN: [id number]  Chief Complaint  Patient presents with  . Follow-up    Asthma     Referring provider: Laurey Morale, MD  HPI: 83 yo male followed for chronic cough , mild persistent asthnma and irritable larynx syndrome.  Has CAD s/p CABG 2007, Aortic stenosis ,    TEST /Events  Previously treated with immunotherapy by Dr. Velora Heckler many years prior.  CT sinus 12/2015 -poor exam, no sign sinus dz noted  ENT in past neg eval  Echo 05/2016 showed moderate Aortic Valve stenosis  Follow with Dr. Fredderick Phenix for allergy  IgE ~200  Spiro 2016 mild airflow obstruction  CXR 12/2015 -clear lungs  IgE 11/2016 180    06/23/2017 Follow up : Asthma /Med Review  Patient presents for one-month follow-up. Last visit pt was having some increased allergy symptoms . He was changed from Zyrtec to Chlortrimeton Twice daily  . And was recommended to take PPI/Zantac. He feels this has helped. He is doing good except.  Feels like allergy meds wear off too soon .   Did not bring meds today .  Breathing is much better. No cough or wheezing.   He underwent TAVR on 02/2017 . He s/p ICD implantation 04/2017 .  Is currently in cardiac rehab. Doing well . Feels a lot of better. Weight is down .  Activity tolerance is increased . Decreased dyspnea.   Leaving for round around trip. -Cruise ship . Leaves in Dec.   Allergies  Allergen Reactions  . Peanut-Containing Drug Products Anaphylaxis  . Sulfonamide Derivatives Anaphylaxis  . Amlodipine Swelling    Swelling in ankles  . Eliquis [Apixaban] Other (See Comments)    Back/hip pain  . Lisinopril Cough    cough  . Xarelto [Rivaroxaban] Other (See Comments)    Back/hip pain    Immunization History  Administered Date(s) Administered  . Hep A / Hep B 04/06/2012, 10/07/2012  . Hepatitis B 05/07/2012  . Influenza Split 06/28/2013  . Influenza Whole 06/05/2007, 05/13/2008, 06/06/2009,  05/30/2010  . Influenza,inj,Quad PF,6+ Mos 05/20/2016  . Influenza-Unspecified 05/25/2014, 07/08/2015, 05/06/2017  . Pneumococcal Conjugate-13 09/18/2015  . Pneumococcal Polysaccharide-23 05/13/2008, 05/25/2014  . Td 05/30/2010  . Zoster 10/21/2007    Past Medical History:  Diagnosis Date  . Age-related macular degeneration, dry, both eyes   . Allergic    "24/7; 365 days/year; I'm allergic to pollens, dust, all [place] grasses/trees, mold, mildue, cats, dogs" (01/07/2017)  . Asthma    sees [name]   . Benign prostatic hypertrophy    (sees Dr. Denman George  . CAD (coronary artery disease)    a. s/p CABG 2007. b. Cath 08/2016 - 4/4 patent grafts.  . Carotid bruit    carotid u/s 10/10: 0.39% bilaterally  . Chronic combined systolic and diastolic CHF (congestive heart failure) ([place])   . Complication of anesthesia 1980s   "w/anal cyst OR, he gave me a saddle block then put a narcotic in spinal cord; had a severe reaction to that" (01/07/2017)  . ED (erectile dysfunction)   . Family history of adverse reaction to anesthesia    "daughter wakes up during OR" (01/07/2017)  . GERD (gastroesophageal reflux disease)   . Gout   . HTN (hypertension)   . Hx of colonic polyps    (sees Dr. Henrene Pastor)  . Hyperlipidemia   . Hypothyroidism   . Moderate to severe aortic stenosis  a. s/p TAVR 02/2017.  [name] Myocardial infarction ([place]) ~ 2000  . Obesity   . Osteoarthritis    "was in my knees, hands" (01/07/2017 )  . PAF (paroxysmal atrial fibrillation) ([place])    a. documented post TAVR.  [name] Precancerous skin lesion    (sees Dr. Allyson Sabal)  . Prostate cancer ([place]) dx'd ~ 2014  . 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 Former Smoker  . Packs/day: 3.50  . Years: 13.00  . Types: Cigarettes  . Quit date: 1963   Smokeless Tobacco  . Never Used   Counseling given: Not Answered   Outpatient Encounter Prescriptions as of 06/23/2017  Medication Sig  . aspirin 81 MG tablet Take 1 tablet (81 mg total) by mouth daily.  [name] atorvastatin (LIPITOR) 20 MG tablet TAKE 1 TABLET ONCE DAILY. (Patient taking differently: TAKE 1 TABLET ONCE DAILY IN THE EVENING)  . benzonatate (TESSALON) 200 MG capsule Take 1 capsule (200 mg total) by mouth 2 (two) times daily as needed for cough. (Patient taking differently: Take 200 mg by mouth 2 (two) times daily. )  . budesonide-formoterol (SYMBICORT) 80-4.5 MCG/ACT inhaler Inhale 2 puffs into the lungs 2 (two) times daily.  . chlorpheniramine (CHLOR-TRIMETON) 4 MG tablet Take 8 mg by mouth 2 (two) times daily.   . dabigatran (PRADAXA) 150 MG CAPS capsule Take 1 capsule (150 mg total) by mouth 2 (two) times daily.  [name] DYMISTA 137-50 MCG/ACT SUSP USE 2 SPRAYS EACH NOSTRIL TWICE DAILY.  . furosemide (LASIX) 80 MG tablet Take 1 tablet (80 mg total) by mouth daily.  . isosorbide mononitrate (IMDUR) 30 MG 24 hr tablet Take 1 tablet (30 mg total) by mouth daily.  [name] Ketotifen Fumarate (ZADITOR OP) Apply 1 drop to eye daily as needed (dry eyes).  [name] levothyroxine (SYNTHROID, LEVOTHROID) 75 MCG tablet Take 1 tablet (75 mcg total) by mouth daily. (Patient taking differently: Take 75 mcg by mouth at bedtime. )  . losartan (COZAAR) 25 MG tablet Take 1 tablet (25 mg total) by mouth daily.  . metoprolol tartrate (LOPRESSOR) 25 MG tablet Take 1 tablet (25 mg total) by mouth 2 (two) times daily.  . montelukast (SINGULAIR) 10 MG tablet Take 1 tablet (10 mg total) by mouth every evening.  . Multiple Vitamins-Minerals (PRESERVISION AREDS PO) Take 2 tablets by mouth every morning.  [name] omeprazole (PRILOSEC) 20 MG capsule Take 1 capsule (20 mg total) by mouth daily.  . potassium chloride SA (K-DUR,KLOR-CON) 20 MEQ tablet TAKE (2) TABLETS DAILY.  . ranitidine (ZANTAC) 150 MG capsule Take 150 mg by mouth  every evening.   . triamcinolone cream (KENALOG) 0.1 % Apply 1 application topically daily as needed for dry skin.   No facility-administered encounter medications on file as of 06/23/2017.      Review of Systems  Constitutional:   No  weight loss, night sweats,  Fevers, chills, fatigue, or  lassitude.  HEENT:   No headaches,  Difficulty swallowing,  Tooth/dental problems, or  Sore throat,                No sneezing, itching, ear ache, +nasal congestion, post nasal drip,   CV:  No chest pain,  Orthopnea, PND, swelling in lower extremities, anasarca, dizziness, palpitations, syncope.   GI  No heartburn, indigestion, abdominal  pain, nausea, vomiting, diarrhea, change in bowel habits, loss of appetite, bloody stools.   Resp:    No excess mucus, no productive cough,  No non-productive cough,  No coughing up of blood.  No change in color of mucus.  No wheezing.  No chest wall deformity  Skin: no rash or lesions.  GU: no dysuria, change in color of urine, no urgency or frequency.  No flank pain, no hematuria   MS:  No joint pain or swelling.  No decreased range of motion.  No back pain.    Physical Exam  BP 102/64 (BP Location: Right Arm, Cuff Size: Normal)   Pulse 76   Ht 5\' 7"  (1.702 m)   Wt 260 lb 3.2 oz (118 kg)   SpO2 97%   BMI 40.75 kg/m   GEN: A/Ox3; pleasant , NAD , elderly    HEENT:  [place]/AT,  EACs-clear, TMs-wnl, NOSE-clear drainage , right nare w/ crusted area , no active bleeding  THROAT-clear, no lesions, no postnasal drip or exudate noted.   NECK:  Supple w/ fair ROM; no JVD; normal carotid impulses w/o bruits; no thyromegaly or nodules palpated; no lymphadenopathy.    RESP  Clear  P & A; w/o, wheezes/ rales/ or rhonchi. no accessory muscle use, no dullness to percussion  CARD:  RRR, no m/r/g, tr  peripheral edema, pulses intact, no cyanosis or clubbing.  GI:   Soft & nt; nml bowel sounds; no organomegaly or masses detected.   Musco: Warm bil, no deformities or  joint swelling noted.   Neuro: alert, no focal deficits noted.    Skin: Warm, no lesions or rashes    Lab Results:  ProBNP No results found for: PROBNP  Imaging: No results found.   Assessment & Plan:   Asthma, intermittent Improved control on current regimen   Plan  Patient Instructions  Restart Zyrtec 10mg  daily in am .  Continue on Symbicort 80/4.52 puffs twice a day Keep taking Dymista Take Chlor-Trimeton At bedtime  .  May use saline spray and gel As needed  Nasal congestion   Keep taking Zantac in the evenings Keep  taking Prilosec in the mornings  Follow up with [name] in Dec as planned and As needed       Allergic rhinitis Restart Zyrtec   Plan  Patient Instructions  Restart Zyrtec 10mg  daily in am .  Continue on Symbicort 80/4.52 puffs twice a day Keep taking Dymista Take Chlor-Trimeton At bedtime  .  May use saline spray and gel As needed  Nasal congestion   Keep taking Zantac in the evenings Keep  taking Prilosec in the mornings  Follow up with [name] in Dec as planned and As needed          Rexene Edison, NP 06/23/2017

## 2017-06-23 NOTE — Assessment & Plan Note (Signed)
Improved control on current regimen   Plan  Patient Instructions  Restart Zyrtec 10mg  daily in am .  Continue on Symbicort 80/4.52 puffs twice a day Keep taking Dymista Take Chlor-Trimeton At bedtime  .  May use saline spray and gel As needed  Nasal congestion   Keep taking Zantac in the evenings Keep  taking Prilosec in the mornings  Follow up with [name] in Dec as planned and As needed

## 2017-06-23 NOTE — Assessment & Plan Note (Signed)
Restart Zyrtec   Plan  Patient Instructions  Restart Zyrtec 10mg  daily in am .  Continue on Symbicort 80/4.52 puffs twice a day Keep taking Dymista Take Chlor-Trimeton At bedtime  .  May use saline spray and gel As needed  Nasal congestion   Keep taking Zantac in the evenings Keep  taking Prilosec in the mornings  Follow up with [name] in Dec as planned and As needed

## 2017-06-24 MED ORDER — BUDESONIDE-FORMOTEROL FUMARATE 80-4.5 MCG/ACT IN AERO: 2.0000 | INHALATION_SPRAY | Freq: Two times a day (BID) | RESPIRATORY_TRACT | 0 refills | Status: DC

## 2017-06-24 MED ORDER — AZELASTINE-FLUTICASONE 137-50 MCG/ACT NA SUSP: 2.0000 | Freq: Two times a day (BID) | NASAL | 0 refills | Status: DC

## 2017-06-24 NOTE — Addendum Note (Signed)
Addended by: Parke Poisson E on: 06/24/2017 10:27 AM   Modules accepted: Orders

## 2017-06-25 ENCOUNTER — Other Ambulatory Visit: Payer: Self-pay | Admitting: Family Medicine

## 2017-06-25 ENCOUNTER — Encounter (HOSPITAL_COMMUNITY)
Admission: RE | Admit: 2017-06-25 | Discharge: 2017-06-25 | Disposition: A | Discharge status: Home / Self Care | Payer: Medicare Other | Source: Ambulatory Visit | Attending: Cardiovascular Disease | Admitting: Cardiovascular Disease

## 2017-06-25 DIAGNOSIS — Z952 Presence of prosthetic heart valve: Secondary | ICD-10-CM

## 2017-06-25 DIAGNOSIS — Z48812 Encounter for surgical aftercare following surgery on the circulatory system: Secondary | ICD-10-CM | POA: Diagnosis not present

## 2017-06-25 NOTE — Telephone Encounter (Signed)
Refill request for Benzonatate 200 mg, Isosorbid 30 mg, Levothyroxine 75 mcg and a 90 day supply to [facility].

## 2017-06-26 NOTE — Telephone Encounter (Signed)
Look-at folder has been given to BQ for review.

## 2017-06-27 ENCOUNTER — Encounter (HOSPITAL_COMMUNITY)
Admission: RE | Admit: 2017-06-27 | Discharge: 2017-06-27 | Disposition: A | Discharge status: Home / Self Care | Payer: Medicare Other | Source: Ambulatory Visit | Attending: Cardiovascular Disease | Admitting: Cardiovascular Disease

## 2017-06-27 DIAGNOSIS — Z48812 Encounter for surgical aftercare following surgery on the circulatory system: Secondary | ICD-10-CM | POA: Diagnosis not present

## 2017-06-27 DIAGNOSIS — Z952 Presence of prosthetic heart valve: Secondary | ICD-10-CM | POA: Diagnosis not present

## 2017-06-27 NOTE — Progress Notes (Signed)
Cardiac Individual Treatment Plan  Patient Details  Name: Justin [name] MRN: [id number] Date of Birth: May 30, 1934 Referring Provider:     CARDIAC REHAB PHASE II ORIENTATION from 05/29/2017 in [facility]  Referring Provider  Darlina Guys      Initial Encounter Date:    CARDIAC REHAB PHASE II ORIENTATION from 05/29/2017 in [facility]  Date  05/29/17  Referring Provider  Darlina Guys      Visit Diagnosis: S/P TAVR (transcatheter aortic valve replacement)  Patient's Home Medications on Admission:  Current Outpatient Prescriptions:  .  aspirin 81 MG tablet, Take 1 tablet (81 mg total) by mouth daily., Disp: 30 tablet, Rfl: 0 .  atorvastatin (LIPITOR) 20 MG tablet, TAKE 1 TABLET ONCE DAILY. (Patient taking differently: TAKE 1 TABLET ONCE DAILY IN THE EVENING), Disp: 90 tablet, Rfl: 3 .  Azelastine-Fluticasone 137-50 MCG/ACT SUSP, Place 2 sprays into both nostrils 2 (two) times daily., Disp: 2 Bottle, Rfl: 0 .  benzonatate (TESSALON) 200 MG capsule, Take 1 capsule (200 mg total) by mouth 2 (two) times daily as needed for cough. (Patient taking differently: Take 200 mg by mouth 2 (two) times daily. ), Disp: 60 capsule, Rfl: 5 .  budesonide-formoterol (SYMBICORT) 80-4.5 MCG/ACT inhaler, Inhale 2 puffs into the lungs 2 (two) times daily., Disp: 1 Inhaler, Rfl: 12 .  budesonide-formoterol (SYMBICORT) 80-4.5 MCG/ACT inhaler, Inhale 2 puffs into the lungs 2 (two) times daily., Disp: 4 Inhaler, Rfl: 0 .  chlorpheniramine (CHLOR-TRIMETON) 4 MG tablet, Take 8 mg by mouth 2 (two) times daily. , Disp: , Rfl:  .  dabigatran (PRADAXA) 150 MG CAPS capsule, Take 1 capsule (150 mg total) by mouth 2 (two) times daily., Disp: 180 capsule, Rfl: 3 .  DYMISTA 137-50 MCG/ACT SUSP, USE 2 SPRAYS EACH NOSTRIL TWICE DAILY., Disp: 23 g, Rfl: 11 .  furosemide (LASIX) 80 MG tablet, Take 1 tablet (80 mg total) by mouth daily., Disp: 90 tablet, Rfl:  3 .  isosorbide mononitrate (IMDUR) 30 MG 24 hr tablet, Take 1 tablet (30 mg total) by mouth daily., Disp: 90 tablet, Rfl: 3 .  Ketotifen Fumarate (ZADITOR OP), Apply 1 drop to eye daily as needed (dry eyes)., Disp: , Rfl:  .  levothyroxine (SYNTHROID, LEVOTHROID) 75 MCG tablet, Take 1 tablet (75 mcg total) by mouth daily. (Patient taking differently: Take 75 mcg by mouth at bedtime. ), Disp: 90 tablet, Rfl: 3 .  losartan (COZAAR) 25 MG tablet, Take 1 tablet (25 mg total) by mouth daily., Disp: 90 tablet, Rfl: 3 .  metoprolol tartrate (LOPRESSOR) 25 MG tablet, Take 1 tablet (25 mg total) by mouth 2 (two) times daily., Disp: 180 tablet, Rfl: 3 .  montelukast (SINGULAIR) 10 MG tablet, Take 1 tablet (10 mg total) by mouth every evening., Disp: 90 tablet, Rfl: 4 .  Multiple Vitamins-Minerals (PRESERVISION AREDS PO), Take 2 tablets by mouth every morning., Disp: , Rfl:  .  omeprazole (PRILOSEC) 20 MG capsule, Take 1 capsule (20 mg total) by mouth daily., Disp: 90 capsule, Rfl: 4 .  potassium chloride SA (K-DUR,KLOR-CON) 20 MEQ tablet, TAKE (2) TABLETS DAILY., Disp: 180 tablet, Rfl: 3 .  ranitidine (ZANTAC) 150 MG capsule, Take 150 mg by mouth every evening. , Disp: , Rfl:  .  triamcinolone cream (KENALOG) 0.1 %, Apply 1 application topically daily as needed for dry skin., Disp: , Rfl:   Past Medical History: Past Medical History:  Diagnosis Date  . Age-related macular degeneration,  dry, both eyes   . Allergic    "24/7; 365 days/year; I'm allergic to pollens, dust, all [place] grasses/trees, mold, mildue, cats, dogs" (01/07/2017)  . Asthma    sees [name]   . Benign prostatic hypertrophy    (sees Dr. Denman George  . CAD (coronary artery disease)    a. s/p CABG 2007. b. Cath 08/2016 - 4/4 patent grafts.  . Carotid bruit    carotid u/s 10/10: 0.39% bilaterally  . Chronic combined systolic and diastolic CHF (congestive heart failure) ([place])   . Complication of anesthesia 1980s   "w/anal cyst OR,  he gave me a saddle block then put a narcotic in spinal cord; had a severe reaction to that" (01/07/2017)  . ED (erectile dysfunction)   . Family history of adverse reaction to anesthesia    "daughter wakes up during OR" (01/07/2017)  . GERD (gastroesophageal reflux disease)   . Gout   . HTN (hypertension)   . Hx of colonic polyps    (sees Dr. Henrene Pastor)  . Hyperlipidemia   . Hypothyroidism   . Moderate to severe aortic stenosis    a. s/p TAVR 02/2017.  [name] Myocardial infarction ([place]) ~ 2000  . Obesity   . Osteoarthritis    "was in my knees, hands" (01/07/2017 )  . PAF (paroxysmal atrial fibrillation) ([place])    a. documented post TAVR.  [name] Precancerous skin lesion    (sees Dr. Allyson Sabal)  . Prostate cancer ([place]) dx'd ~ 2014  . 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 Former Smoker  . Packs/day: 3.50  . Years: 13.00  . Types: Cigarettes  . Quit date: 1963  Smokeless Tobacco  . Never Used    Labs: Recent Review Flowsheet Data    Labs for ITP Cardiac and Pulmonary Rehab Latest Ref Rng & Units 08/27/2016 09/25/2016 02/27/2017 03/04/2017 03/04/2017   Cholestrol 0 - 200 mg/dL - 109 - - -   LDLCALC 0 - 99 mg/dL - 53 - - -   HDL >39.00 mg/dL - 29.80(L) - - -   Trlycerides 0.0 - 149.0 mg/dL - 133.0 - - -   Hemoglobin A1c 4.8 - 5.6 % - - 6.5(H) - -   PHART 7.350 - 7.450 7.437 - 7.475(H) - -   PCO2ART 32.0 - 48.0 mmHg 39.2 - 36.4 - -   HCO3 20.0 - 28.0 mmol/L 26.5 - 26.5 - -   TCO2 0 - 100 mmol/L 28 - - 28 28   O2SAT % 94.0 - 95.0 - -      Capillary Blood Glucose: No results found for: GLUCAP   Exercise Target Goals:    Exercise Program Goal: Individual exercise prescription set with THRR, safety & activity barriers. Participant demonstrates ability to understand and report RPE using BORG scale, to  self-measure pulse accurately, and to acknowledge the importance of the exercise prescription.  Exercise Prescription Goal: Starting with aerobic activity 30 plus minutes a day, 3 days per week for initial exercise prescription. Provide home exercise prescription and guidelines that participant acknowledges understanding prior to discharge.  Activity Barriers & Risk Stratification:     Activity Barriers & Cardiac Risk Stratification - 05/29/17 0819      Activity Barriers & Cardiac Risk Stratification   Activity Barriers Joint Problems;Deconditioning;Muscular Weakness;Incisional Pain;Left Knee Replacement;Right  Knee Replacement;Other (comment)   Comments toes straighten, R thumb surgery   Cardiac Risk Stratification High      6 Minute Walk:     6 Minute Walk    Row Name 05/29/17 0820 05/29/17 1015 05/29/17 1033     6 Minute Walk   Phase Initial  -  -   Distance 1372 feet  -  -   Distance Feet Change  - 1372 ft  -   Walk Time 6 minutes  -  -   # of Rest Breaks 0  -  -   MPH  - 2.6  -   METS  - 1.87  -   RPE 11  -  -   VO2 Peak  - 1372  -   Symptoms  - Yes (comment)  -   Comments  - brief chest pressure noted at the end. Resolved at end of walk test  -   Resting HR 89 bpm  -  -   Resting BP 124/60  -  -   Resting Oxygen Saturation  96 %  -  -   Exercise Oxygen Saturation  during 6 min walk 96 %  -  -   Max Ex. HR 111 bpm  -  -   Max Ex. BP 134/64  -  -   2 Minute Post BP  -  - 122/70      Oxygen Initial Assessment:   Oxygen Re-Evaluation:   Oxygen Discharge (Final Oxygen Re-Evaluation):   Initial Exercise Prescription:     Initial Exercise Prescription - 05/29/17 1000      Date of Initial Exercise RX and Referring Provider   Date 05/29/17   Referring Provider Darlina Guys     Treadmill   MPH 1.8   Grade 0   Minutes 10   METs 2.38     Bike   Level --   Minutes --   METs --     Recumbant Bike   Level 1.5   Minutes 10   METs 1.8     NuStep    Level 2   SPM 70   Minutes 10   METs 2     Prescription Details   Frequency (times per week) 3   Duration Progress to 30 minutes of continuous aerobic without signs/symptoms of physical distress     Intensity   THRR 40-80% of Max Heartrate 56-112   Ratings of Perceived Exertion 11-13   Perceived Dyspnea 0-4     Progression   Progression Continue to progress workloads to maintain intensity without signs/symptoms of physical distress.     Resistance Training   Training Prescription No  recent IDC placement on 05/02/17      Perform Capillary Blood Glucose checks as needed.  Exercise Prescription Changes:     Exercise Prescription Changes    Row Name 06/03/17 1400 06/23/17 1443           Response to Exercise   Blood Pressure (Admit) 106/60 114/64      Blood Pressure (Exercise) 124/70 110/72      Blood Pressure (Exit) 104/62 108/70      Heart Rate (Admit) 97 bpm 74 bpm      Heart Rate (Exercise) 108 bpm 93 bpm      Heart Rate (Exit) 97 bpm 74 bpm      Rating of Perceived Exertion (Exercise) 11 11      Symptoms none none      Comments pt  was oriented to exercise equipment on 06/02/17 pt was oriented to exercise equipment on 06/02/17      Duration Continue with 45 min of aerobic exercise without signs/symptoms of physical distress. Continue with 45 min of aerobic exercise without signs/symptoms of physical distress.      Intensity THRR unchanged THRR unchanged        Progression   Progression Continue to progress workloads to maintain intensity without signs/symptoms of physical distress. Continue to progress workloads to maintain intensity without signs/symptoms of physical distress.      Average METs 2.3 2.7        Treadmill   MPH 1.8 2      Grade 0 1      Minutes 10 10      METs 2.38 2.81        Recumbant Bike   Level 1.5 3      Minutes 10 10      METs 1.9 2.7        NuStep   Level 2 4      SPM 80 80      Minutes 10 10      METs 2.6 2.7        Home  Exercise Plan   Plans to continue exercise at  - Home (comment)      Frequency  - Add 2 additional days to program exercise sessions.      Initial Home Exercises Provided  - 06/09/17         Exercise Comments:     Exercise Comments    Row Name 06/02/17 1423 06/24/17 1447         Exercise Comments Pt was oriented to exercise equipment today. Pt responded well to first exercise session and denied SOB, chesp pain and dizziness with exercise today; will continue to monitor. Reviewed METs and goals. Pt is tolerating exercise well; will continue to monitor pt's progress and activity levels.          Exercise Goals and Review:     Exercise Goals    Row Name 05/29/17 0820             Exercise Goals   Increase Physical Activity Yes       Intervention Provide advice, education, support and counseling about physical activity/exercise needs.;Develop an individualized exercise prescription for aerobic and resistive training based on initial evaluation findings, risk stratification, comorbidities and participant's personal goals.       Expected Outcomes Achievement of increased cardiorespiratory fitness and enhanced flexibility, muscular endurance and strength shown through measurements of functional capacity and personal statement of participant.       Increase Strength and Stamina Yes  learn activity/exercise limitations       Intervention Provide advice, education, support and counseling about physical activity/exercise needs.;Develop an individualized exercise prescription for aerobic and resistive training based on initial evaluation findings, risk stratification, comorbidities and participant's personal goals.       Expected Outcomes Achievement of increased cardiorespiratory fitness and enhanced flexibility, muscular endurance and strength shown through measurements of functional capacity and personal statement of participant.       Able to understand and use rate of perceived exertion  (RPE) scale Yes       Intervention Provide education and explanation on how to use RPE scale       Expected Outcomes Short Term: Able to use RPE daily in rehab to express subjective intensity level;Long Term:  Able to use RPE to guide intensity  level when exercising independently       Knowledge and understanding of Target Heart Rate Range (THRR) Yes       Intervention Provide education and explanation of THRR including how the numbers were predicted and where they are located for reference       Expected Outcomes Short Term: Able to state/look up THRR;Long Term: Able to use THRR to govern intensity when exercising independently;Short Term: Able to use daily as guideline for intensity in rehab       Able to check pulse independently Yes       Intervention Provide education and demonstration on how to check pulse in carotid and radial arteries.;Review the importance of being able to check your own pulse for safety during independent exercise       Expected Outcomes Short Term: Able to explain why pulse checking is important during independent exercise;Long Term: Able to check pulse independently and accurately       Understanding of Exercise Prescription Yes       Intervention Provide education, explanation, and written materials on patient's individual exercise prescription       Expected Outcomes Short Term: Able to explain program exercise prescription;Long Term: Able to explain home exercise prescription to exercise independently          Exercise Goals Re-Evaluation :     Exercise Goals Re-Evaluation    Row Name 06/03/17 1424 06/03/17 1426 06/09/17 1114 06/24/17 1447       Exercise Goal Re-Evaluation   Exercise Goals Review Able to understand and use rate of perceived exertion (RPE) scale  - Increase Physical Activity;Able to understand and use rate of perceived exertion (RPE) scale;Knowledge and understanding of Target Heart Rate Range (THRR);Understanding of Exercise  Prescription;Increase Strength and Stamina;Able to check pulse independently Increase Physical Activity;Able to understand and use rate of perceived exertion (RPE) scale;Knowledge and understanding of Target Heart Rate Range (THRR);Understanding of Exercise Prescription;Increase Strength and Stamina;Able to check pulse independently    Comments  - Pt demonstated use of RPE scale with exercise. Reviewed home exercise with pt today.  Pt plans to walk indoors at local shopping stores for exercise.  Reviewed THR, pulse, RPE, sign and symptoms, and when to call 911 or MD.  Also discussed weather considerations and indoor options.  Pt voiced understanding. Pt is making great progress and handles WL increases very well. Pt is compliant with HEP and is able to exercise for 30 minutes without difficulty.    Expected Outcomes  - Pt will continue to exercise in cardiac rehab safely.  Pt will be compliant with HEP and improve in cardiorespiratory fitness. Pt will be compliant with HEP and improve in cardiorespiratory fitness.        Discharge Exercise Prescription (Final Exercise Prescription Changes):     Exercise Prescription Changes - 06/23/17 1443      Response to Exercise   Blood Pressure (Admit) 114/64   Blood Pressure (Exercise) 110/72   Blood Pressure (Exit) 108/70   Heart Rate (Admit) 74 bpm   Heart Rate (Exercise) 93 bpm   Heart Rate (Exit) 74 bpm   Rating of Perceived Exertion (Exercise) 11   Symptoms none   Comments pt was oriented to exercise equipment on 06/02/17   Duration Continue with 45 min of aerobic exercise without signs/symptoms of physical distress.   Intensity THRR unchanged     Progression   Progression Continue to progress workloads to maintain intensity without signs/symptoms of physical distress.   Average METs  2.7     Treadmill   MPH 2   Grade 1   Minutes 10   METs 2.81     Recumbant Bike   Level 3   Minutes 10   METs 2.7     NuStep   Level 4   SPM 80    Minutes 10   METs 2.7     Home Exercise Plan   Plans to continue exercise at Home (comment)   Frequency Add 2 additional days to program exercise sessions.   Initial Home Exercises Provided 06/09/17      Nutrition:  Target Goals: Understanding of nutrition guidelines, daily intake of sodium 1500mg , cholesterol 200mg , calories 30% from fat and 7% or less from saturated fats, daily to have 5 or more servings of fruits and vegetables.  Biometrics:     Pre Biometrics - 05/29/17 1027      Pre Biometrics   Waist Circumference 51 inches   Hip Circumference 49.5 inches   Waist to Hip Ratio 1.03 %   Triceps Skinfold 29 mm   % Body Fat 40.7 %   Grip Strength 39 kg   Flexibility 7.5 in   Single Leg Stand 4.45 seconds       Nutrition Therapy Plan and Nutrition Goals:     Nutrition Therapy & Goals - 06/04/17 0908      Nutrition Therapy   Diet Carb Modified, Therapeutic Lifestyle Changes     Personal Nutrition Goals   Nutrition Goal Pt to identify food quantities necessary to achieve weight loss of 6-24 lb (2.7-10.9 kg) at graduation from cardiac rehab.   Personal Goal #2 Pt able to name foods that affect blood glucose     Intervention Plan   Intervention Prescribe, educate and counsel regarding individualized specific dietary modifications aiming towards targeted core components such as weight, hypertension, lipid management, diabetes, heart failure and other comorbidities.   Expected Outcomes Short Term Goal: Understand basic principles of dietary content, such as calories, fat, sodium, cholesterol and nutrients.;Long Term Goal: Adherence to prescribed nutrition plan.      Nutrition Discharge: Nutrition Scores:     Nutrition Assessments - 06/20/17 0940      MEDFICTS Scores   Pre Score 23      Nutrition Goals Re-Evaluation:   Nutrition Goals Re-Evaluation:   Nutrition Goals Discharge (Final Nutrition Goals Re-Evaluation):   Psychosocial: Target Goals:  Acknowledge presence or absence of significant depression and/or stress, maximize coping skills, provide positive support system. Participant is able to verbalize types and ability to use techniques and skills needed for reducing stress and depression.  Initial Review & Psychosocial Screening:     Initial Psych Review & Screening - 05/29/17 1253      Barriers   Psychosocial barriers to participate in program The patient should benefit from training in stress management and relaxation.     Screening Interventions   Interventions Encouraged to exercise      Quality of Life Scores:     Quality of Life - 05/29/17 1029      Quality of Life Scores   Health/Function Pre 29.11 %   Socioeconomic Pre 29.38 %   Psych/Spiritual Pre 30 %   Family Pre 30 %   GLOBAL Pre 29.49 %      PHQ-9: Recent Review Flowsheet Data    Depression screen [facility] 2/9 06/02/2017 01/17/2017 12/20/2014 11/12/2013   Decreased Interest 0 0 0 0   Down, Depressed, Hopeless 0 0 0 0   PHQ -  2 Score 0 0 0 0     Interpretation of Total Score  Total Score Depression Severity:  1-4 = Minimal depression, 5-9 = Mild depression, 10-14 = Moderate depression, 15-19 = Moderately severe depression, 20-27 = Severe depression   Psychosocial Evaluation and Intervention:     Psychosocial Evaluation - 06/02/17 1631      Psychosocial Evaluation & Interventions   Interventions Encouraged to exercise with the program and follow exercise prescription;Stress management education;Relaxation education   Comments no psychosocial needs identified, no interventions necessary    Expected Outcomes pt will exhibit positive outlook with good coping skills.   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 otherwise no psychosocial needs identified, no interventions necessary      Expected Outcomes pt will exhibit positive outlook with good coping skills.  pt will exhibit positive outlook with good coping skills.       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 otherwise no psychosocial needs identified, no interventions necessary   Expected Outcomes pt will exhibit positive outlook with good coping skills.    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   Vocational Rehab Evaluation & Intervention:     Vocational Rehab - 05/29/17 1225      Initial Vocational  Rehab Evaluation & Intervention   Assessment shows need for Vocational Rehabilitation No      Education: Education Goals: Education classes will be provided on a weekly basis, covering required topics. Participant will state understanding/return demonstration of topics presented.  Learning Barriers/Preferences:     Learning Barriers/Preferences - 05/29/17 1030      Learning Barriers/Preferences   Learning Barriers Sight      Education Topics: Count Your Pulse:  -Group instruction provided by verbal instruction, demonstration, patient participation and written materials to support subject.  Instructors address importance of being able to find your pulse and how to count your pulse when at home without a heart monitor.  Patients get hands on experience counting their pulse with staff help and individually.   CARDIAC REHAB PHASE II EXERCISE from 06/27/2017 in [place] [facility]  Date  06/27/17  Instruction Review Code  2- meets goals/outcomes      Heart Attack, Angina, and Risk Factor Modification:  -Group instruction provided by verbal instruction, video, and written materials to support subject.  Instructors address signs and symptoms of angina and heart attacks.    Also discuss risk factors for heart disease and how to make changes to improve heart health risk factors.   CARDIAC REHAB PHASE II EXERCISE from 06/27/2017 in [place]  Date  06/25/17  Instruction Review Code  2- meets goals/outcomes      Functional Fitness:  -Group instruction provided by verbal instruction, demonstration, patient participation, and written materials to support subject.  Instructors address safety measures for doing things around the house.  Discuss how to get up and down off the floor, how to pick things up properly, how to safely get out of a chair without assistance, and balance training.   CARDIAC REHAB PHASE II EXERCISE from 06/27/2017 in  [place]  Date  06/13/17  Instruction Review Code  2- meets goals/outcomes      Meditation and Mindfulness:  -Group instruction provided by verbal instruction, patient participation, and written materials to support subject.  Instructor addresses importance of mindfulness and meditation practice to help reduce stress and improve awareness.  Instructor also leads participants through a meditation exercise.    Stretching for Flexibility and Mobility:  -Group instruction provided by verbal instruction, patient participation, and written materials to support subject.  Instructors lead participants through series of stretches that are designed to increase flexibility thus improving mobility.  These stretches are additional exercise for major muscle groups that are typically performed during regular warm up and cool down.   CARDIAC REHAB PHASE II EXERCISE from 06/27/2017 in [place]  Date  06/18/17  Instruction Review Code  2- meets goals/outcomes      Hands Only CPR:  -Group verbal, video, and participation provides a basic overview of AHA guidelines for community CPR. Role-play of emergencies allow participants the opportunity to practice calling for help and chest compression technique with discussion of AED use.   Hypertension: -Group verbal and written instruction that provides a basic overview of hypertension including the most recent diagnostic guidelines, risk factor reduction with self-care instructions and medication management.    Nutrition I class: Heart Healthy Eating:  -Group instruction provided by PowerPoint slides, verbal discussion, and written materials to support subject matter. The instructor gives an explanation and review of the Therapeutic Lifestyle Changes diet recommendations, which includes a discussion on lipid goals, dietary fat, sodium, fiber, plant stanol/sterol esters, sugar, and the components of  a well-balanced, healthy diet.   Nutrition II class: Lifestyle Skills:  -Group instruction provided by PowerPoint slides, verbal discussion, and written materials to support subject matter. The instructor gives an explanation and review of label reading, grocery shopping for heart health, heart healthy recipe modifications, and ways to make healthier choices when eating out.   CARDIAC REHAB PHASE II EXERCISE from 06/27/2017 in [place]  Date  06/10/17  Educator  RD  Instruction Review Code  2- meets goals/outcomes      Diabetes Question & Answer:  -Group instruction provided by PowerPoint slides, verbal discussion, and written materials to  support subject matter. The instructor gives an explanation and review of diabetes co-morbidities, pre- and post-prandial blood glucose goals, pre-exercise blood glucose goals, signs, symptoms, and treatment of hypoglycemia and hyperglycemia, and foot care basics.   CARDIAC REHAB PHASE II EXERCISE from 06/27/2017 in [place]  Date  06/06/17  Educator  RD  Instruction Review Code  2- meets goals/outcomes      Diabetes Blitz:  -Group instruction provided by PowerPoint slides, verbal discussion, and written materials to support subject matter. The instructor gives an explanation and review of the physiology behind type 1 and type 2 diabetes, diabetes medications and rational behind using different medications, pre- and post-prandial blood glucose recommendations and Hemoglobin A1c goals, diabetes diet, and exercise including blood glucose guidelines for exercising safely.    Portion Distortion:  -Group instruction provided by PowerPoint slides, verbal discussion, written materials, and food models to support subject matter. The instructor gives an explanation of serving size versus portion size, changes in portions sizes over the last 20 years, and what consists of a serving from each food  group.   CARDIAC REHAB PHASE II EXERCISE from 06/27/2017 in [place]  Date  06/11/17  Educator  RD  Instruction Review Code  2- meets goals/outcomes      Stress Management:  -Group instruction provided by verbal instruction, video, and written materials to support subject matter.  Instructors review role of stress in heart disease and how to cope with stress positively.     CARDIAC REHAB PHASE II EXERCISE from 06/27/2017 in [place]  Date  06/20/17  Instruction Review Code  2- meets goals/outcomes      Exercising on Your Own:  -Group instruction provided by verbal instruction, power point, and written materials to support subject.  Instructors discuss benefits of exercise, components of exercise, frequency and intensity of exercise, and end points for exercise.  Also discuss use of nitroglycerin and activating EMS.  Review options of places to exercise outside of rehab.  Review guidelines for sex with heart disease.   Cardiac Drugs I:  -Group instruction provided by verbal instruction and written materials to support subject.  Instructor reviews cardiac drug classes: antiplatelets, anticoagulants, beta blockers, and statins.  Instructor discusses reasons, side effects, and lifestyle considerations for each drug class.   Cardiac Drugs II:  -Group instruction provided by verbal instruction and written materials to support subject.  Instructor reviews cardiac drug classes: angiotensin converting enzyme inhibitors (ACE-I), angiotensin II receptor blockers (ARBs), nitrates, and calcium channel blockers.  Instructor discusses reasons, side effects, and lifestyle considerations for each drug class.   CARDIAC REHAB PHASE II EXERCISE from 06/27/2017 in [place]  Date  06/04/17  Instruction Review Code  2- meets goals/outcomes      Anatomy and Physiology of the Circulatory System:  Group verbal  and written instruction and models provide basic cardiac anatomy and physiology, with the coronary electrical and arterial systems. Review of: AMI, Angina, Valve disease, Heart Failure, Peripheral Artery Disease, Cardiac Arrhythmia, Pacemakers, and the ICD.   Other Education:  -Group or individual verbal, written, or video instructions that support the educational goals of the cardiac rehab program.   Knowledge Questionnaire Score:     Knowledge Questionnaire Score - 05/29/17 1030      Knowledge Questionnaire Score   Pre Score 22/24      Core Components/Risk Factors/Patient Goals at Admission:  Personal Goals and Risk Factors at Admission - 06/02/17 0922      Core Components/Risk Factors/Patient Goals on Admission    Weight Management Yes;Weight Maintenance;Weight Loss;Obesity   Intervention Weight Management: Develop a combined nutrition and exercise program designed to reach desired caloric intake, while maintaining appropriate intake of nutrient and fiber, sodium and fats, and appropriate energy expenditure required for the weight goal.   Admit Weight 262 lb 9.1 oz (119.1 kg)   Goal Weight: Short Term 255 lb (115.7 kg)   Goal Weight: Long Term 250 lb (113.4 kg)   Expected Outcomes Short Term: Continue to assess and modify interventions until short term weight is achieved;Weight Maintenance: Understanding of the daily nutrition guidelines, which includes 25-35% calories from fat, 7% or less cal from saturated fats, less than 200mg  cholesterol, less than 1.5gm of sodium, & 5 or more servings of fruits and vegetables daily;Weight Loss: Understanding of general recommendations for a balanced deficit meal plan, which promotes 1-2 lb weight loss per week and includes a negative energy balance of [phone] kcal/d;Understanding recommendations for meals to include 15-35% energy as protein, 25-35% energy from fat, 35-60% energy from carbohydrates, less than 200mg  of dietary cholesterol, 20-35  gm of total fiber daily;Understanding of distribution of calorie intake throughout the day with the consumption of 4-5 meals/snacks   Intervention Provide a combined exercise and nutrition program that is supplemented with education, support and counseling about heart failure. Directed toward relieving symptoms such as shortness of breath, decreased exercise tolerance, and extremity edema.   Expected Outcomes Improve functional capacity of life;Short term: Attendance in program 2-3 days a week with increased exercise capacity. Reported lower sodium intake. Reported increased fruit and vegetable intake. Reports medication compliance.;Short term: Daily weights obtained and reported for increase. Utilizing diuretic protocols set by physician.;Long term: Adoption of self-care skills and reduction of barriers for early signs and symptoms recognition and intervention leading to self-care maintenance.   Hypertension Yes   Intervention Provide education on lifestyle modifcations including regular physical activity/exercise, weight management, moderate sodium restriction and increased consumption of fresh fruit, vegetables, and low fat dairy, alcohol moderation, and smoking cessation.;Monitor prescription use compliance.   Expected Outcomes Short Term: Continued assessment and intervention until BP is < 140/29mm HG in hypertensive participants. < 130/10mm HG in hypertensive participants with diabetes, heart failure or chronic kidney disease.;Long Term: Maintenance of blood pressure at goal levels.   Lipids Yes   Intervention Provide education and support for participant on nutrition & aerobic/resistive exercise along with prescribed medications to achieve LDL 70mg , HDL >40mg .   Expected Outcomes Short Term: Participant states understanding of desired cholesterol values and is compliant with medications prescribed. Participant is following exercise prescription and nutrition guidelines.;Long Term: Cholesterol  controlled with medications as prescribed, with individualized exercise RX and with personalized nutrition plan. Value goals: LDL < 70mg , HDL > 40 mg.   Stress Yes   Intervention Offer individual and/or small group education and counseling on adjustment to heart disease, stress management and health-related lifestyle change. Teach and support self-help strategies.;Refer participants experiencing significant psychosocial distress to appropriate mental health specialists for further evaluation and treatment. When possible, include family members and significant others in education/counseling sessions.   Expected Outcomes Short Term: Participant demonstrates changes in health-related behavior, relaxation and other stress management skills, ability to obtain effective social support, and compliance with psychotropic medications if prescribed.;Long Term: Emotional wellbeing is indicated by absence of clinically significant psychosocial distress or social isolation.   Personal Goal  Other Yes   Personal Goal pt personal goals for cardaic rehab are to learn safe, appropriate activity parameters.  pt would like to continue weight loss until goal wt 250lb achieved.    Intervention provide exercise, nutrition and lifestyle modification education to promote safe exercise, activity levels and weight loss goals.     Expected Outcomes pt wil participate in CR exercise, nutrition and lifestyle modification education opportunities      Core Components/Risk Factors/Patient Goals Review:      Goals and Risk Factor Review    Row Name 06/03/17 1641 06/24/17 1511 06/27/17 1056         Core Components/Risk Factors/Patient Goals Review   Personal Goals Review Weight Management/Obesity;Heart Failure;Hypertension;Lipids;Stress Weight Management/Obesity;Heart Failure;Hypertension;Lipids;Stress Weight Management/Obesity;Heart Failure;Hypertension;Lipids;Stress     Review pt with multiple CAD RF demonstrates eagerness to  participate in CR. pt with multiple CAD RF demonstrates eagerness to participate in CR. pt with multiple CAD RF demonstrates eagerness to participate in CR. pt pleased with his increased flexability since CR participation.     Expected Outcomes pt will participate in CR exercise, nutrition and lifestyle modification opportunities.  pt will participate in CR exercise, nutrition and lifestyle modification opportunities.  pt will participate in CR exercise, nutrition and lifestyle modification opportunities.         Core Components/Risk Factors/Patient Goals at Discharge (Final Review):      Goals and Risk Factor Review - 06/27/17 1056      Core Components/Risk Factors/Patient Goals Review   Personal Goals Review Weight Management/Obesity;Heart Failure;Hypertension;Lipids;Stress   Review pt with multiple CAD RF demonstrates eagerness to participate in CR. pt pleased with his increased flexability since CR participation.   Expected Outcomes pt will participate in CR exercise, nutrition and lifestyle modification opportunities.       ITP Comments:     ITP Comments    Row Name 05/29/17 0800 06/03/17 1639 06/24/17 1511       ITP Comments Medical Director, Dr. Fransico Him 30day ITP review.  pt started group exercise 06/02/2017.  pt demonstrates eagerness to participate in program 30day ITP review.  pt started group exercise 06/02/2017.  pt demonstrates eagerness to participate in program        Comments:

## 2017-06-27 NOTE — Telephone Encounter (Signed)
Per Dr. Sarajane Jews okay to refill this 3 scripts.

## 2017-06-27 NOTE — Telephone Encounter (Signed)
I spoke with pt, he still has a few refills left. He will notify office about 1 week before time.

## 2017-06-27 NOTE — Telephone Encounter (Signed)
This letter was regarding switching pharmacies and samples.  See 10/29 OV with TP- this was addressed then.  Will close encounter.

## 2017-06-30 ENCOUNTER — Encounter (HOSPITAL_COMMUNITY)
Admission: RE | Admit: 2017-06-30 | Discharge: 2017-06-30 | Disposition: A | Discharge status: Home / Self Care | Payer: Medicare Other | Source: Ambulatory Visit | Attending: Cardiovascular Disease | Admitting: Cardiovascular Disease

## 2017-06-30 ENCOUNTER — Encounter (HOSPITAL_COMMUNITY): Payer: Medicare Other

## 2017-06-30 DIAGNOSIS — Z952 Presence of prosthetic heart valve: Secondary | ICD-10-CM | POA: Diagnosis not present

## 2017-06-30 DIAGNOSIS — Z48812 Encounter for surgical aftercare following surgery on the circulatory system: Secondary | ICD-10-CM | POA: Diagnosis not present

## 2017-06-30 DIAGNOSIS — H353132 Nonexudative age-related macular degeneration, bilateral, intermediate dry stage: Secondary | ICD-10-CM | POA: Diagnosis not present

## 2017-07-02 ENCOUNTER — Encounter (HOSPITAL_COMMUNITY)
Admission: RE | Admit: 2017-07-02 | Discharge: 2017-07-02 | Disposition: A | Discharge status: Home / Self Care | Payer: Medicare Other | Source: Ambulatory Visit | Attending: Cardiovascular Disease | Admitting: Cardiovascular Disease

## 2017-07-02 DIAGNOSIS — Z952 Presence of prosthetic heart valve: Secondary | ICD-10-CM

## 2017-07-02 DIAGNOSIS — Z48812 Encounter for surgical aftercare following surgery on the circulatory system: Secondary | ICD-10-CM | POA: Diagnosis not present

## 2017-07-04 ENCOUNTER — Encounter (HOSPITAL_COMMUNITY)
Admission: RE | Admit: 2017-07-04 | Discharge: 2017-07-04 | Disposition: A | Discharge status: Home / Self Care | Payer: Medicare Other | Source: Ambulatory Visit | Attending: Cardiovascular Disease | Admitting: Cardiovascular Disease

## 2017-07-04 DIAGNOSIS — Z48812 Encounter for surgical aftercare following surgery on the circulatory system: Secondary | ICD-10-CM | POA: Diagnosis not present

## 2017-07-04 DIAGNOSIS — Z952 Presence of prosthetic heart valve: Secondary | ICD-10-CM | POA: Diagnosis not present

## 2017-07-07 ENCOUNTER — Encounter (HOSPITAL_COMMUNITY)
Admission: RE | Admit: 2017-07-07 | Discharge: 2017-07-07 | Disposition: A | Discharge status: Home / Self Care | Payer: Medicare Other | Source: Ambulatory Visit | Attending: Cardiovascular Disease | Admitting: Cardiovascular Disease

## 2017-07-07 DIAGNOSIS — Z952 Presence of prosthetic heart valve: Secondary | ICD-10-CM | POA: Diagnosis not present

## 2017-07-07 DIAGNOSIS — Z48812 Encounter for surgical aftercare following surgery on the circulatory system: Secondary | ICD-10-CM | POA: Diagnosis not present

## 2017-07-09 ENCOUNTER — Telehealth: Payer: Self-pay | Admitting: Pulmonary Disease

## 2017-07-09 ENCOUNTER — Encounter (HOSPITAL_COMMUNITY)
Admission: RE | Admit: 2017-07-09 | Discharge: 2017-07-09 | Disposition: A | Discharge status: Home / Self Care | Payer: Medicare Other | Source: Ambulatory Visit | Attending: Cardiovascular Disease | Admitting: Cardiovascular Disease

## 2017-07-09 DIAGNOSIS — Z952 Presence of prosthetic heart valve: Secondary | ICD-10-CM | POA: Diagnosis not present

## 2017-07-09 DIAGNOSIS — Z48812 Encounter for surgical aftercare following surgery on the circulatory system: Secondary | ICD-10-CM | POA: Diagnosis not present

## 2017-07-09 NOTE — Telephone Encounter (Signed)
Spoke with pt, he states for a couple of months, sensitive to heat and cold, mouth and teeth are sensitive. He does wash his mouth out with water and doesn't know if its the Symbicort or Dymista. He states he takes 21 medications and wanted to start with the meds he gets from [place] bc he uses inhalers. This has been going on for 2 months and he wanted to see if it would go away but it hasn't. BQ please advise.

## 2017-07-10 NOTE — Telephone Encounter (Signed)
It's possible I suppose but I don't know why it would have developed after years of use.  He should have a dentist look at his mouth to assess.  If nothing seen then we can consider a switch.  Given his degree of symptoms though I don't recommend stopping either of these until a dentist has taken a look.

## 2017-07-10 NOTE — Telephone Encounter (Signed)
lmtcb x1 for pt. 

## 2017-07-10 NOTE — Telephone Encounter (Signed)
Spoke with patient. He is aware of BQ's response. He said that he has an appt with his dentist and will address these issues with him then.   Nothing else needed at time of call.

## 2017-07-11 ENCOUNTER — Encounter (HOSPITAL_COMMUNITY)
Admission: RE | Admit: 2017-07-11 | Discharge: 2017-07-11 | Disposition: A | Discharge status: Home / Self Care | Payer: Medicare Other | Source: Ambulatory Visit | Attending: Cardiovascular Disease | Admitting: Cardiovascular Disease

## 2017-07-11 DIAGNOSIS — Z48812 Encounter for surgical aftercare following surgery on the circulatory system: Secondary | ICD-10-CM | POA: Diagnosis not present

## 2017-07-11 DIAGNOSIS — Z952 Presence of prosthetic heart valve: Secondary | ICD-10-CM | POA: Diagnosis not present

## 2017-07-14 ENCOUNTER — Encounter (HOSPITAL_COMMUNITY)
Admission: RE | Admit: 2017-07-14 | Discharge: 2017-07-14 | Disposition: A | Discharge status: Home / Self Care | Payer: Medicare Other | Source: Ambulatory Visit | Attending: Cardiovascular Disease | Admitting: Cardiovascular Disease

## 2017-07-14 DIAGNOSIS — Z952 Presence of prosthetic heart valve: Secondary | ICD-10-CM

## 2017-07-14 DIAGNOSIS — Z48812 Encounter for surgical aftercare following surgery on the circulatory system: Secondary | ICD-10-CM | POA: Diagnosis not present

## 2017-07-16 ENCOUNTER — Encounter (HOSPITAL_COMMUNITY)
Admission: RE | Admit: 2017-07-16 | Discharge: 2017-07-16 | Disposition: A | Discharge status: Home / Self Care | Payer: Medicare Other | Source: Ambulatory Visit | Attending: Cardiovascular Disease | Admitting: Cardiovascular Disease

## 2017-07-16 DIAGNOSIS — Z952 Presence of prosthetic heart valve: Secondary | ICD-10-CM | POA: Diagnosis not present

## 2017-07-16 DIAGNOSIS — Z48812 Encounter for surgical aftercare following surgery on the circulatory system: Secondary | ICD-10-CM | POA: Diagnosis not present

## 2017-07-21 ENCOUNTER — Encounter (HOSPITAL_COMMUNITY)
Admission: RE | Admit: 2017-07-21 | Discharge: 2017-07-21 | Disposition: A | Discharge status: Home / Self Care | Payer: Medicare Other | Source: Ambulatory Visit | Attending: Cardiovascular Disease | Admitting: Cardiovascular Disease

## 2017-07-21 DIAGNOSIS — Z48812 Encounter for surgical aftercare following surgery on the circulatory system: Secondary | ICD-10-CM | POA: Diagnosis not present

## 2017-07-21 DIAGNOSIS — Z952 Presence of prosthetic heart valve: Secondary | ICD-10-CM | POA: Diagnosis not present

## 2017-07-23 ENCOUNTER — Encounter (HOSPITAL_COMMUNITY)
Admission: RE | Admit: 2017-07-23 | Discharge: 2017-07-23 | Disposition: A | Discharge status: Home / Self Care | Payer: Medicare Other | Source: Ambulatory Visit | Attending: Cardiovascular Disease | Admitting: Cardiovascular Disease

## 2017-07-23 DIAGNOSIS — Z48812 Encounter for surgical aftercare following surgery on the circulatory system: Secondary | ICD-10-CM | POA: Diagnosis not present

## 2017-07-23 DIAGNOSIS — Z952 Presence of prosthetic heart valve: Secondary | ICD-10-CM

## 2017-07-23 DIAGNOSIS — D225 Melanocytic nevi of trunk: Secondary | ICD-10-CM | POA: Diagnosis not present

## 2017-07-23 DIAGNOSIS — L570 Actinic keratosis: Secondary | ICD-10-CM | POA: Diagnosis not present

## 2017-07-23 DIAGNOSIS — L820 Inflamed seborrheic keratosis: Secondary | ICD-10-CM | POA: Diagnosis not present

## 2017-07-23 DIAGNOSIS — L304 Erythema intertrigo: Secondary | ICD-10-CM | POA: Diagnosis not present

## 2017-07-23 NOTE — Progress Notes (Signed)
Chief Complaint  Patient presents with  . Coronary Artery Disease    History of Present Illness: 83 yo male with history of HTN, HLD, severe aortic stenosis s/p TAVR, chronic diastolic CHF and CAD s/p 4V CABG who is here today for one month TAVR follow up. He is known to have CAD s/p 4V CABG in March 2007 (LIMA to LAD, SVG to Diagonal, SVG to OM, SVG to RCA). Last cardiac cath in January 2018 with 4/4 patent bypass grafts. He had progression of his aortic stenosis over the last year with worsened dyspnea and acute on chronic CHF. Echo May 2018 with mildly reduced LV systolic function, XQJJ=94-17%. His aortic stenosis was severe. He underwent TAVR on 03/04/17 with a 29 mm Edwards Sapien 3 valve from the right transfemoral approach.  He developed atrial fibrillation following his procedure and was placed on IV amiodarone with conversion to sinus rhythm. He has been on ASA and Eliquis (Xarelto had been used initially but pt thought it caused him to have shoulder pain). Follow up visit in our office 03/19/17 and pt noted to have irregular rhythm with intraventricular conduction delay and was felt by our EP team to represent 2:1 conduction of competing fast and slow pathways. Cardiac monitor with sinus rhythm with periods of junctional rhythm, frequent PVCs, frequent PACs and runs of a wide complex tachycardia. ICD was implanted on 05/03/17.   He is here today for follow up. The patient denies any chest pain, dyspnea, palpitations, lower extremity edema, orthopnea, PND, dizziness, near syncope or syncope. He still has mild LE edema. He is excited about his 5 month cruise that starts next month.    Primary Care Physician: Laurey Morale, MD   Past Medical History:  Diagnosis Date  . Age-related macular degeneration, dry, both eyes   . Allergic    "24/7; 365 days/year; I'm allergic to pollens, dust, all southern grasses/trees, mold, mildue, cats, dogs" (01/07/2017)  . Asthma    sees [name]   . Benign  prostatic hypertrophy    (sees Dr. Denman George  . CAD (coronary artery disease)    a. s/p CABG 2007. b. Cath 08/2016 - 4/4 patent grafts.  . Carotid bruit    carotid u/s 10/10: 0.39% bilaterally  . Chronic combined systolic and diastolic CHF (congestive heart failure) ([place])   . Complication of anesthesia 1980s   "w/anal cyst OR, he gave me a saddle block then put a narcotic in spinal cord; had a severe reaction to that" (01/07/2017)  . ED (erectile dysfunction)   . Family history of adverse reaction to anesthesia    "daughter wakes up during OR" (01/07/2017)  . GERD (gastroesophageal reflux disease)   . Gout   . HTN (hypertension)   . Hx of colonic polyps    (sees Dr. Henrene Pastor)  . Hyperlipidemia   . Hypothyroidism   . Moderate to severe aortic stenosis    a. s/p TAVR 02/2017.  [name] Myocardial infarction ([place]) ~ 2000  . Obesity   . Osteoarthritis    "was in my knees, hands" (01/07/2017 )  . PAF (paroxysmal atrial fibrillation) ([place])    a. documented post TAVR.  [name] Precancerous skin lesion    (sees Dr. Allyson Sabal)  . Prostate cancer ([place]) dx'd ~ 2014  . 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 ANUS SURGERY     "opened it back up cause it wouldn't heal; wound up w/a fissure" (01/07/2017)  . BIV ICD INSERTION CRT-D N/A 05/02/2017   Procedure: BIV ICD INSERTION CRT-D;  Surgeon: Deboraha Sprang, MD;  Location: [facility];  Service: Cardiovascular;  Laterality: N/A;  . CARDIAC CATHETERIZATION  10/29/2005  . CARDIAC CATHETERIZATION N/A 08/27/2016   Procedure: Right/Left Heart Cath and Coronary/Graft Angiography;  Surgeon: Burnell Blanks, MD;  Location: [facility];  Service: Cardiovascular;  Laterality: N/A;  . CATARACT EXTRACTION W/ INTRAOCULAR LENS  IMPLANT, BILATERAL Bilateral     . CATARACT EXTRACTION, BILATERAL  2012  . COLONOSCOPY  06/30/2008   no repeats needed   . CORONARY ARTERY BYPASS GRAFT  2007   "CABG X4"  . CYST EXCISION PERINEAL  1980s  . HAMMER TOE SURGERY Bilateral   . JOINT REPLACEMENT    . KNEE ARTHROPLASTY  07/30/2011   Procedure: COMPUTER ASSISTED TOTAL KNEE ARTHROPLASTY;  Surgeon: Meredith Pel;  Location: [facility];  Service: Orthopedics;  Laterality: Left;  left total knee arthroplasty  . MASTECTOMY SUBCUTANEOUS Bilateral   . ORIF FINGER / THUMB FRACTURE Right ~ 1980   "repair of thumb injury"  . PROSTATE BIOPSY    . REPLACEMENT TOTAL KNEE BILATERAL Bilateral 2012  . TEE WITHOUT CARDIOVERSION N/A 03/04/2017   Procedure: TRANSESOPHAGEAL ECHOCARDIOGRAM (TEE);  Surgeon: Burnell Blanks, MD;  Location: [facility];  Service: Open Heart Surgery;  Laterality: N/A;  . TRANSCATHETER AORTIC VALVE REPLACEMENT, TRANSFEMORAL N/A 03/04/2017   Procedure: TRANSCATHETER AORTIC VALVE REPLACEMENT, TRANSFEMORAL;  Surgeon: Burnell Blanks, MD;  Location: [facility];  Service: Open Heart Surgery;  Laterality: N/A;    Current Outpatient Medications  Medication Sig Dispense Refill  . aspirin 81 MG tablet Take 1 tablet (81 mg total) by mouth daily. 30 tablet 0  . atorvastatin (LIPITOR) 20 MG tablet TAKE 1 TABLET ONCE DAILY. (Patient taking differently: TAKE 1 TABLET ONCE DAILY IN THE EVENING) 90 tablet 3  . Azelastine-Fluticasone 137-50 MCG/ACT SUSP Place 2 sprays into both nostrils 2 (two) times daily. 2 Bottle 0  . benzonatate (TESSALON) 200 MG capsule Take 1 capsule (200 mg total) by mouth 2 (two) times daily as needed for cough. (Patient taking differently: Take 200 mg by mouth 2 (two) times daily. ) 60 capsule 5  . budesonide-formoterol (SYMBICORT) 80-4.5 MCG/ACT inhaler Inhale 2 puffs into the lungs 2 (two) times daily. 1 Inhaler 12  . budesonide-formoterol (SYMBICORT) 80-4.5 MCG/ACT inhaler Inhale 2 puffs into the lungs 2 (two) times daily. 4 Inhaler 0  .  chlorpheniramine (CHLOR-TRIMETON) 4 MG tablet Take 8 mg by mouth 2 (two) times daily.     . dabigatran (PRADAXA) 150 MG CAPS capsule Take 1 capsule (150 mg total) by mouth 2 (two) times daily. 180 capsule 3  . DYMISTA 137-50 MCG/ACT SUSP USE 2 SPRAYS EACH NOSTRIL TWICE DAILY. 23 g 11  . isosorbide mononitrate (IMDUR) 30 MG 24 hr tablet Take 1 tablet (30 mg total) by mouth daily. 90 tablet 3  . Ketotifen Fumarate (ZADITOR OP) Apply 1 drop to eye daily as needed (dry eyes).    [name] levothyroxine (SYNTHROID, LEVOTHROID) 75 MCG tablet Take 1 tablet (75 mcg total) by mouth daily. (Patient taking differently: Take 75 mcg by mouth at bedtime. ) 90 tablet 3  . losartan (COZAAR) 25 MG tablet Take 1 tablet (25 mg total) by mouth daily. 90 tablet 3  .  metoprolol tartrate (LOPRESSOR) 25 MG tablet Take 1 tablet (25 mg total) by mouth 2 (two) times daily. 180 tablet 3  . montelukast (SINGULAIR) 10 MG tablet Take 1 tablet (10 mg total) by mouth every evening. 90 tablet 4  . Multiple Vitamins-Minerals (PRESERVISION AREDS PO) Take 2 tablets by mouth every morning.    [name] omeprazole (PRILOSEC) 20 MG capsule Take 1 capsule (20 mg total) by mouth daily. 90 capsule 4  . ranitidine (ZANTAC) 150 MG capsule Take 150 mg by mouth every evening.     . triamcinolone cream (KENALOG) 0.1 % Apply 1 application topically daily as needed for dry skin.    . furosemide (LASIX) 80 MG tablet Take 80 mg daily alternating with 80 mg twice daily. 135 tablet 3  . potassium chloride SA (K-DUR,KLOR-CON) 20 MEQ tablet Take 2 tablets by mouth twice daily alternating with 2 tablets daily. 270 tablet 3   No current facility-administered medications for this visit.     Allergies  Allergen Reactions  . Peanut-Containing Drug Products Anaphylaxis  . Sulfonamide Derivatives Anaphylaxis  . Amlodipine Swelling    Swelling in ankles  . Eliquis [Apixaban] Other (See Comments)    Back/hip pain  . Lisinopril Cough    cough  . Xarelto [Rivaroxaban]  Other (See Comments)    Back/hip pain    Social History   Socioeconomic History  . Marital status: Married    Spouse name: Not on file  . Number of children: Not on file  . Years of education: Not on file  . Highest education level: Not on file  Social Needs  . Financial resource strain: Not on file  . Food insecurity - worry: Not on file  . Food insecurity - inability: Not on file  . Transportation needs - medical: Not on file  . Transportation needs - non-medical: Not on file  Occupational History  . Occupation: Clinical biochemist    Comment: builds malls  Tobacco Use  . Smoking status: Former Smoker    Packs/day: 3.50    Years: 13.00    Pack years: 45.50    Types: Cigarettes    Last attempt to quit: 1963    Years since quitting: 55.9  . Smokeless tobacco: Never Used  Substance and Sexual Activity  . Alcohol use: Yes    Alcohol/week: 0.0 oz    Comment: 01/07/2017 "might have 2 beers 2-3 times/month"  . Drug use: No  . Sexual activity: No  Other Topics Concern  . Not on file  Social History Narrative   FH of CAD, Male 1st degree relative less than age 64.    Family History  Problem Relation Age of Onset  . Heart attack Father 57  . Heart failure Mother 54  . Uterine cancer Mother     Review of Systems:  As stated in the HPI and otherwise negative.   BP 124/70   Pulse 74   Ht 5\' 7"  (1.702 m)   Wt 263 lb (119.3 kg)   SpO2 97%   BMI 41.19 kg/m   Physical Examination:  General: Well developed, well nourished, NAD  HEENT: OP clear, mucus membranes moist  SKIN: warm, dry. No rashes. Neuro: No focal deficits  Musculoskeletal: Muscle strength 5/5 all ext  Psychiatric: Mood and affect normal  Neck: No JVD, no carotid bruits, no thyromegaly, no lymphadenopathy.  Lungs:Clear bilaterally, no wheezes, rhonci, crackles Cardiovascular: Regular rate and rhythm. No murmurs, gallops or rubs. Abdomen:Soft. Bowel sounds present. Non-tender.  Extremities: No  lower  extremity edema. Pulses are 2 + in the bilateral DP/PT.  Echo 04/04/17 - Procedure narrative: Transthoracic echocardiography for left   ventricular function evaluation, for right ventricular function   evaluation, and for assessment of valvular function. Image   quality was suboptimal. Intravenous contrast (Definity) was   administered to enhance regional wall motion assessment and   opacify the LV. - Left ventricle: The cavity size was normal. There was moderate   concentric hypertrophy. Systolic function was mildly to   moderately reduced. The estimated ejection fraction was in the   range of 40% to 45%. Inferior and distal inferoseptal akinesis.   Doppler parameters are consistent with abnormal left ventricular   relaxation (grade 1 diastolic dysfunction). The E/e&' ratio is   >15, suggesting elevated LV filling pressure. - Aortic valve: s/p 29 mm Sapien 3 TAVR bioprosthesis. No   obstruction. Mean gradient (S): 8 mm Hg. Peak gradient (S): 15 mm   Hg. Valve area (VTI): 1.82 cm^2. Valve area (Vmax): 1.53 cm^2.   Valve area (Vmean): 1.61 cm^2. - Aorta: Ascending aortic diameter: 39 mm (S). - Ascending aorta: The ascending aorta is mildly dilated. - Left atrium: Moderately dilated. - Right ventricle: The cavity size was mildly dilated. mildly   reduced systolic function. - Right atrium: The atrium was mildly dilated. - Atrial septum: No defect or patent foramen ovale was identified. - Inferior vena cava: The vessel was normal in size. The   respirophasic diameter changes were in the normal range (>= 50%),   consistent with normal central venous pressure.  Impressions:  - Compared to a prior study in 02/2017, the LVEF is unchanged. The   TAVR gradient is stable.    Cardiac cath 08/27/16: Diagnostic Diagram          EKG:  EKG is not ordered today The ekg ordered today demonstrates  Recent Labs: 01/07/2017: B Natriuretic Peptide 653.3 02/27/2017: ALT 20 03/19/2017:  Magnesium 2.2; TSH 3.680 04/23/2017: BUN 13; Creatinine, Ser 0.74; Hemoglobin 13.9; Platelets 241; Potassium 3.7; Sodium 142   Lipid Panel    Component Value Date/Time   CHOL 109 09/25/2016 0828   TRIG 133.0 09/25/2016 0828   TRIG 143 06/30/2006 1132   HDL 29.80 (L) 09/25/2016 0828   CHOLHDL 4 09/25/2016 0828   VLDL 26.6 09/25/2016 0828   LDLCALC 53 09/25/2016 0828     Wt Readings from Last 3 Encounters:  07/24/17 263 lb (119.3 kg)  06/23/17 260 lb 3.2 oz (118 kg)  05/29/17 262 lb 9.1 oz (119.1 kg)     Other studies Reviewed: Additional studies/ records that were reviewed today include:  Review of the above records demonstrates:    Assessment and Plan:   1. CAD s/p CABG with angina: He has no chest pain suggestive of angina. Will continue ASA, statin, beta blocker and Imdur.      2. Aortic valve stenosis: He is s/p TAVR in July 2018. Will continue ASA and Pradaxa.   3. HTN: BP controlled. No changes.   4. HLD: Continue statin  5. Chronic systolic and diastolic CHF: Volume status stable on Lasix but he still has some LE edema. Will increase Lasix to 80 mg po BID every other day alternating with lasix 80 mg daily.   6. Atrial fibrillation, paroxysmal: He had post-operative atrial fibrillation following TAVR. He appears to be in atrial fib today. He is on a beta blocker and Pradaxa.  Rate is controlled.   7. Ischemic cardiomyopathy: ICD in place.  8. Junctional bradycardia: ICD in place.   Current medicines are reviewed at length with the patient today.  The patient does not have concerns regarding medicines.  The following changes have been made:  no change  Labs/ tests ordered today include:   No orders of the defined types were placed in this encounter.  Disposition:  Follow up with me in June 2019 when he is back from his 5 month cruise.   Signed, [name], MD 07/24/2017 11:44 AM    [facility] [address], [place],  [place]  [address] Phone: [phone]; Fax: [phone]

## 2017-07-24 ENCOUNTER — Encounter: Payer: Self-pay | Admitting: Cardiovascular Disease

## 2017-07-24 ENCOUNTER — Ambulatory Visit: Payer: Medicare Other | Admitting: Cardiovascular Disease

## 2017-07-24 VITALS — BP 124/70 | HR 74 | Ht 67.0 in | Wt 263.0 lb

## 2017-07-24 DIAGNOSIS — I10 Essential (primary) hypertension: Secondary | ICD-10-CM | POA: Diagnosis not present

## 2017-07-24 DIAGNOSIS — I255 Ischemic cardiomyopathy: Secondary | ICD-10-CM

## 2017-07-24 DIAGNOSIS — I480 Paroxysmal atrial fibrillation: Secondary | ICD-10-CM | POA: Diagnosis not present

## 2017-07-24 DIAGNOSIS — I5042 Chronic combined systolic (congestive) and diastolic (congestive) heart failure: Secondary | ICD-10-CM

## 2017-07-24 DIAGNOSIS — I350 Nonrheumatic aortic (valve) stenosis: Secondary | ICD-10-CM

## 2017-07-24 MED ORDER — DABIGATRAN ETEXILATE MESYLATE 150 MG PO CAPS: 150.0000 mg | ORAL_CAPSULE | Freq: Two times a day (BID) | ORAL | 3 refills | Status: DC

## 2017-07-24 MED ORDER — POTASSIUM CHLORIDE CRYS ER 20 MEQ PO TBCR: EXTENDED_RELEASE_TABLET | ORAL | 3 refills | Status: DC

## 2017-07-24 MED ORDER — METOPROLOL TARTRATE 25 MG PO TABS: 25.0000 mg | ORAL_TABLET | Freq: Two times a day (BID) | ORAL | 3 refills | Status: DC

## 2017-07-24 MED ORDER — LOSARTAN POTASSIUM 25 MG PO TABS: 25.0000 mg | ORAL_TABLET | Freq: Every day | ORAL | 3 refills | Status: DC

## 2017-07-24 MED ORDER — FUROSEMIDE 80 MG PO TABS: ORAL_TABLET | ORAL | 3 refills | Status: DC

## 2017-07-24 NOTE — Patient Instructions (Signed)
Medication Instructions:  Your physician has recommended you make the following change in your medication:  Increase furosemide to 80 mg twice daily alternating with 80 mg daily. Increase potassium to 40 meq twice daily alternating with 40 meq daily.     Labwork: none  Testing/Procedures: none  Follow-Up: Your physician recommends that you schedule a follow-up appointment in: June.  Please call us in about 3 months to schedule this appointment.     Any Other Special Instructions Will Be Listed Below (If Applicable).     If you need a refill on your cardiac medications before your next appointment, please call your pharmacy.

## 2017-07-25 ENCOUNTER — Encounter (HOSPITAL_COMMUNITY)
Admission: RE | Admit: 2017-07-25 | Discharge: 2017-07-25 | Disposition: A | Discharge status: Home / Self Care | Payer: Medicare Other | Source: Ambulatory Visit | Attending: Cardiovascular Disease | Admitting: Cardiovascular Disease

## 2017-07-25 DIAGNOSIS — Z48812 Encounter for surgical aftercare following surgery on the circulatory system: Secondary | ICD-10-CM | POA: Diagnosis not present

## 2017-07-25 DIAGNOSIS — Z952 Presence of prosthetic heart valve: Secondary | ICD-10-CM | POA: Diagnosis not present

## 2017-07-25 NOTE — Progress Notes (Signed)
Cardiac Individual Treatment Plan  Patient Details  Name: Justin [name] MRN: [id number] Date of Birth: 1934-04-27 Referring Provider:     CARDIAC REHAB PHASE II ORIENTATION from 05/29/2017 in [place]  Referring Provider  Darlina Guys      Initial Encounter Date:    CARDIAC REHAB PHASE II ORIENTATION from 05/29/2017 in [facility]  Date  05/29/17  Referring Provider  Darlina Guys      Visit Diagnosis: S/P TAVR (transcatheter aortic valve replacement)  Patient's Home Medications on Admission:  Current Outpatient Medications:  .  aspirin 81 MG tablet, Take 1 tablet (81 mg total) by mouth daily., Disp: 30 tablet, Rfl: 0 .  atorvastatin (LIPITOR) 20 MG tablet, TAKE 1 TABLET ONCE DAILY. (Patient taking differently: TAKE 1 TABLET ONCE DAILY IN THE EVENING), Disp: 90 tablet, Rfl: 3 .  Azelastine-Fluticasone 137-50 MCG/ACT SUSP, Place 2 sprays into both nostrils 2 (two) times daily., Disp: 2 Bottle, Rfl: 0 .  benzonatate (TESSALON) 200 MG capsule, Take 1 capsule (200 mg total) by mouth 2 (two) times daily as needed for cough. (Patient taking differently: Take 200 mg by mouth 2 (two) times daily. ), Disp: 60 capsule, Rfl: 5 .  budesonide-formoterol (SYMBICORT) 80-4.5 MCG/ACT inhaler, Inhale 2 puffs into the lungs 2 (two) times daily., Disp: 1 Inhaler, Rfl: 12 .  budesonide-formoterol (SYMBICORT) 80-4.5 MCG/ACT inhaler, Inhale 2 puffs into the lungs 2 (two) times daily., Disp: 4 Inhaler, Rfl: 0 .  chlorpheniramine (CHLOR-TRIMETON) 4 MG tablet, Take 8 mg by mouth 2 (two) times daily. , Disp: , Rfl:  .  dabigatran (PRADAXA) 150 MG CAPS capsule, Take 1 capsule (150 mg total) by mouth 2 (two) times daily., Disp: 180 capsule, Rfl: 3 .  DYMISTA 137-50 MCG/ACT SUSP, USE 2 SPRAYS EACH NOSTRIL TWICE DAILY., Disp: 23 g, Rfl: 11 .  furosemide (LASIX) 80 MG tablet, Take 80 mg daily alternating with 80 mg twice daily., Disp: 135 tablet,  Rfl: 3 .  isosorbide mononitrate (IMDUR) 30 MG 24 hr tablet, Take 1 tablet (30 mg total) by mouth daily., Disp: 90 tablet, Rfl: 3 .  Ketotifen Fumarate (ZADITOR OP), Apply 1 drop to eye daily as needed (dry eyes)., Disp: , Rfl:  .  levothyroxine (SYNTHROID, LEVOTHROID) 75 MCG tablet, Take 1 tablet (75 mcg total) by mouth daily. (Patient taking differently: Take 75 mcg by mouth at bedtime. ), Disp: 90 tablet, Rfl: 3 .  losartan (COZAAR) 25 MG tablet, Take 1 tablet (25 mg total) by mouth daily., Disp: 90 tablet, Rfl: 3 .  metoprolol tartrate (LOPRESSOR) 25 MG tablet, Take 1 tablet (25 mg total) by mouth 2 (two) times daily., Disp: 180 tablet, Rfl: 3 .  montelukast (SINGULAIR) 10 MG tablet, Take 1 tablet (10 mg total) by mouth every evening., Disp: 90 tablet, Rfl: 4 .  Multiple Vitamins-Minerals (PRESERVISION AREDS PO), Take 2 tablets by mouth every morning., Disp: , Rfl:  .  omeprazole (PRILOSEC) 20 MG capsule, Take 1 capsule (20 mg total) by mouth daily., Disp: 90 capsule, Rfl: 4 .  potassium chloride SA (K-DUR,KLOR-CON) 20 MEQ tablet, Take 2 tablets by mouth twice daily alternating with 2 tablets daily., Disp: 270 tablet, Rfl: 3 .  ranitidine (ZANTAC) 150 MG capsule, Take 150 mg by mouth every evening. , Disp: , Rfl:  .  triamcinolone cream (KENALOG) 0.1 %, Apply 1 application topically daily as needed for dry skin., Disp: , Rfl:   Past Medical History: Past Medical  History:  Diagnosis Date  . Age-related macular degeneration, dry, both eyes   . Allergic    "24/7; 365 days/year; I'm allergic to pollens, dust, all southern grasses/trees, mold, mildue, cats, dogs" (01/07/2017)  . Asthma    sees [name]   . Benign prostatic hypertrophy    (sees Dr. Denman George  . CAD (coronary artery disease)    a. s/p CABG 2007. b. Cath 08/2016 - 4/4 patent grafts.  . Carotid bruit    carotid u/s 10/10: 0.39% bilaterally  . Chronic combined systolic and diastolic CHF (congestive heart failure) ([place])   .  Complication of anesthesia 1980s   "w/anal cyst OR, he gave me a saddle block then put a narcotic in spinal cord; had a severe reaction to that" (01/07/2017)  . ED (erectile dysfunction)   . Family history of adverse reaction to anesthesia    "daughter wakes up during OR" (01/07/2017)  . GERD (gastroesophageal reflux disease)   . Gout   . HTN (hypertension)   . Hx of colonic polyps    (sees Dr. Henrene Pastor)  . Hyperlipidemia   . Hypothyroidism   . Moderate to severe aortic stenosis    a. s/p TAVR 02/2017.  [name] Myocardial infarction ([place]) ~ 2000  . Obesity   . Osteoarthritis    "was in my knees, hands" (01/07/2017 )  . PAF (paroxysmal atrial fibrillation) ([place])    a. documented post TAVR.  [name] Precancerous skin lesion    (sees Dr. Allyson Sabal)  . Prostate cancer ([place]) dx'd ~ 2014  . 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 Packs/day: 3.50  . Years: 13.00  . Pack years: 45.50  . Types: Cigarettes  . Last attempt to quit: 1963  . Years since quitting: 55.9  Smokeless Tobacco Never Used    Labs: Recent Chemical engineer    Labs for ITP Cardiac and Pulmonary Rehab Latest Ref Rng & Units 08/27/2016 09/25/2016 02/27/2017 03/04/2017 03/04/2017   Cholestrol 0 - 200 mg/dL - 109 - - -   LDLCALC 0 - 99 mg/dL - 53 - - -   HDL >39.00 mg/dL - 29.80(L) - - -   Trlycerides 0.0 - 149.0 mg/dL - 133.0 - - -   Hemoglobin A1c 4.8 - 5.6 % - - 6.5(H) - -   PHART 7.350 - 7.450 7.437 - 7.475(H) - -   PCO2ART 32.0 - 48.0 mmHg 39.2 - 36.4 - -   HCO3 20.0 - 28.0 mmol/L 26.5 - 26.5 - -   TCO2 0 - 100 mmol/L 28 - - 28 28   O2SAT % 94.0 - 95.0 - -      Capillary Blood Glucose: No results found for: GLUCAP   Exercise Target Goals:    Exercise Program Goal: Individual exercise  prescription set with THRR, safety & activity barriers. Participant demonstrates ability to understand and report RPE using BORG scale, to self-measure pulse accurately, and to acknowledge the importance of the exercise prescription.  Exercise Prescription Goal: Starting with aerobic activity 30 plus minutes a day, 3 days per week for initial exercise prescription. Provide home exercise prescription and guidelines that participant acknowledges understanding prior to discharge.  Activity Barriers & Risk Stratification: Activity Barriers & Cardiac Risk Stratification - 05/29/17 7124  Activity Barriers & Cardiac Risk Stratification   Activity Barriers  Joint Problems;Deconditioning;Muscular Weakness;Incisional Pain;Left Knee Replacement;Right Knee Replacement;Other (comment)    Comments  toes straighten, R thumb surgery    Cardiac Risk Stratification  High       6 Minute Walk: 6 Minute Walk    Row Name 05/29/17 0820 05/29/17 1015 05/29/17 1033     6 Minute Walk   Phase  Initial  -  -   Distance  1372 feet  -  -   Distance Feet Change  -  1372 ft  -   Walk Time  6 minutes  -  -   # of Rest Breaks  0  -  -   MPH  -  2.6  -   METS  -  1.87  -   RPE  11  -  -   VO2 Peak  -  1372  -   Symptoms  -  Yes (comment)  -   Comments  -  brief chest pressure noted at the end. Resolved at end of walk test  -   Resting HR  89 bpm  -  -   Resting BP  124/60  -  -   Resting Oxygen Saturation   96 %  -  -   Exercise Oxygen Saturation  during 6 min walk  96 %  -  -   Max Ex. HR  111 bpm  -  -   Max Ex. BP  134/64  -  -   2 Minute Post BP  -  -  122/70      Oxygen Initial Assessment:   Oxygen Re-Evaluation:   Oxygen Discharge (Final Oxygen Re-Evaluation):   Initial Exercise Prescription: Initial Exercise Prescription - 05/29/17 1000      Date of Initial Exercise RX and Referring Provider   Date  05/29/17    Referring Provider  Darlina Guys      Treadmill   MPH  1.8    Grade   0    Minutes  10    METs  2.38      Bike   Level  --    Minutes  --    METs  --      Recumbant Bike   Level  1.5    Minutes  10    METs  1.8      NuStep   Level  2    SPM  70    Minutes  10    METs  2      Prescription Details   Frequency (times per week)  3    Duration  Progress to 30 minutes of continuous aerobic without signs/symptoms of physical distress      Intensity   THRR 40-80% of Max Heartrate  56-112    Ratings of Perceived Exertion  11-13    Perceived Dyspnea  0-4      Progression   Progression  Continue to progress workloads to maintain intensity without signs/symptoms of physical distress.      Resistance Training   Training Prescription  No recent IDC placement on 05/02/17       Perform Capillary Blood Glucose checks as needed.  Exercise Prescription Changes: Exercise Prescription Changes    Row Name 06/03/17 1400 06/23/17 1443 07/07/17 1356 07/22/17 1400       Response to Exercise   Blood Pressure (Admit)  106/60  114/64  124/62  102/72    Blood  Pressure (Exercise)  124/70  110/72  128/70  140/60    Blood Pressure (Exit)  104/62  108/70  98/58  108/60    Heart Rate (Admit)  97 bpm  74 bpm  82 bpm  86 bpm    Heart Rate (Exercise)  108 bpm  93 bpm  99 bpm  104 bpm    Heart Rate (Exit)  97 bpm  74 bpm  61 bpm  78 bpm    Rating of Perceived Exertion (Exercise)  11  11  11  11     Symptoms  none  none  none  none    Comments  pt was oriented to exercise equipment on 06/02/17  pt was oriented to exercise equipment on 06/02/17  -  -    Duration  Continue with 45 min of aerobic exercise without signs/symptoms of physical distress.  Continue with 45 min of aerobic exercise without signs/symptoms of physical distress.  Continue with 45 min of aerobic exercise without signs/symptoms of physical distress.  Continue with 45 min of aerobic exercise without signs/symptoms of physical distress.    Intensity  THRR unchanged  THRR unchanged  THRR unchanged  THRR  unchanged      Progression   Progression  Continue to progress workloads to maintain intensity without signs/symptoms of physical distress.  Continue to progress workloads to maintain intensity without signs/symptoms of physical distress.  Continue to progress workloads to maintain intensity without signs/symptoms of physical distress.  Continue to progress workloads to maintain intensity without signs/symptoms of physical distress.    Average METs  2.3  2.7  3.4  2.6      Treadmill   MPH  1.8  2  2  2     Grade  0  1  1  1     Minutes  10  10  10  10     METs  2.38  2.81  2.81  2.81      Recumbant Bike   Level  1.5  3  3  3     Minutes  10  10  10  10     METs  1.9  2.7  3.4  2.4      NuStep   Level  2  4  4  4     SPM  80  80  100  100    Minutes  10  10  10  10     METs  2.6  2.7  4.1  2.5      Home Exercise Plan   Plans to continue exercise at  -  Home (comment)  Home (comment)  Home (comment)    Frequency  -  Add 2 additional days to program exercise sessions.  Add 2 additional days to program exercise sessions.  Add 2 additional days to program exercise sessions.    Initial Home Exercises Provided  -  06/09/17  06/09/17  06/09/17       Exercise Comments: Exercise Comments    Row Name 06/02/17 1423 06/24/17 1447 07/22/17 1433       Exercise Comments  Pt was oriented to exercise equipment today. Pt responded well to first exercise session and denied SOB, chesp pain and dizziness with exercise today; will continue to monitor.  Reviewed METs and goals. Pt is tolerating exercise well; will continue to monitor pt's progress and activity levels.   Reviewed METs and goals. Pt is tolerating exercise well; will continue to monitor pt's progress and activity levels.  Exercise Goals and Review: Exercise Goals    Row Name 05/29/17 0820             Exercise Goals   Increase Physical Activity  Yes       Intervention  Provide advice, education, support and counseling about  physical activity/exercise needs.;Develop an individualized exercise prescription for aerobic and resistive training based on initial evaluation findings, risk stratification, comorbidities and participant's personal goals.       Expected Outcomes  Achievement of increased cardiorespiratory fitness and enhanced flexibility, muscular endurance and strength shown through measurements of functional capacity and personal statement of participant.       Increase Strength and Stamina  Yes learn activity/exercise limitations       Intervention  Provide advice, education, support and counseling about physical activity/exercise needs.;Develop an individualized exercise prescription for aerobic and resistive training based on initial evaluation findings, risk stratification, comorbidities and participant's personal goals.       Expected Outcomes  Achievement of increased cardiorespiratory fitness and enhanced flexibility, muscular endurance and strength shown through measurements of functional capacity and personal statement of participant.       Able to understand and use rate of perceived exertion (RPE) scale  Yes       Intervention  Provide education and explanation on how to use RPE scale       Expected Outcomes  Short Term: Able to use RPE daily in rehab to express subjective intensity level;Long Term:  Able to use RPE to guide intensity level when exercising independently       Knowledge and understanding of Target Heart Rate Range (THRR)  Yes       Intervention  Provide education and explanation of THRR including how the numbers were predicted and where they are located for reference       Expected Outcomes  Short Term: Able to state/look up THRR;Long Term: Able to use THRR to govern intensity when exercising independently;Short Term: Able to use daily as guideline for intensity in rehab       Able to check pulse independently  Yes       Intervention  Provide education and demonstration on how to check  pulse in carotid and radial arteries.;Review the importance of being able to check your own pulse for safety during independent exercise       Expected Outcomes  Short Term: Able to explain why pulse checking is important during independent exercise;Long Term: Able to check pulse independently and accurately       Understanding of Exercise Prescription  Yes       Intervention  Provide education, explanation, and written materials on patient's individual exercise prescription       Expected Outcomes  Short Term: Able to explain program exercise prescription;Long Term: Able to explain home exercise prescription to exercise independently          Exercise Goals Re-Evaluation : Exercise Goals Re-Evaluation    Row Name 06/03/17 1424 06/03/17 1426 06/09/17 1114 06/24/17 1447 07/22/17 1433     Exercise Goal Re-Evaluation   Exercise Goals Review  Able to understand and use rate of perceived exertion (RPE) scale  -  Increase Physical Activity;Able to understand and use rate of perceived exertion (RPE) scale;Knowledge and understanding of Target Heart Rate Range (THRR);Understanding of Exercise Prescription;Increase Strength and Stamina;Able to check pulse independently  Increase Physical Activity;Able to understand and use rate of perceived exertion (RPE) scale;Knowledge and understanding of Target Heart Rate Range (THRR);Understanding of Exercise Prescription;Increase  Strength and Stamina;Able to check pulse independently  Increase Physical Activity;Able to understand and use rate of perceived exertion (RPE) scale;Knowledge and understanding of Target Heart Rate Range (THRR);Understanding of Exercise Prescription;Increase Strength and Stamina;Able to check pulse independently   Comments  -  Pt demonstated use of RPE scale with exercise.  Reviewed home exercise with pt today.  Pt plans to walk indoors at local shopping stores for exercise.  Reviewed THR, pulse, RPE, sign and symptoms, and when to call 911 or  MD.  Also discussed weather considerations and indoor options.  Pt voiced understanding.  Pt is making great progress and handles WL increases very well. Pt is compliant with HEP and is able to exercise for 30 minutes without difficulty.  Pt states "everyday is getting better" Pt is now using handheld weights in cardiac rehab.   Expected Outcomes  -  Pt will continue to exercise in cardiac rehab safely.   Pt will be compliant with HEP and improve in cardiorespiratory fitness.  Pt will be compliant with HEP and improve in cardiorespiratory fitness.  Pt will continue to improve in cardiorespiratory fitness and upper extremity strength.       Discharge Exercise Prescription (Final Exercise Prescription Changes): Exercise Prescription Changes - 07/22/17 1400      Response to Exercise   Blood Pressure (Admit)  102/72    Blood Pressure (Exercise)  140/60    Blood Pressure (Exit)  108/60    Heart Rate (Admit)  86 bpm    Heart Rate (Exercise)  104 bpm    Heart Rate (Exit)  78 bpm    Rating of Perceived Exertion (Exercise)  11    Symptoms  none    Duration  Continue with 45 min of aerobic exercise without signs/symptoms of physical distress.    Intensity  THRR unchanged      Progression   Progression  Continue to progress workloads to maintain intensity without signs/symptoms of physical distress.    Average METs  2.6      Treadmill   MPH  2    Grade  1    Minutes  10    METs  2.81      Recumbant Bike   Level  3    Minutes  10    METs  2.4      NuStep   Level  4    SPM  100    Minutes  10    METs  2.5      Home Exercise Plan   Plans to continue exercise at  Home (comment)    Frequency  Add 2 additional days to program exercise sessions.    Initial Home Exercises Provided  06/09/17       Nutrition:  Target Goals: Understanding of nutrition guidelines, daily intake of sodium 1500mg , cholesterol 200mg , calories 30% from fat and 7% or less from saturated fats, daily to have 5  or more servings of fruits and vegetables.  Biometrics: Pre Biometrics - 05/29/17 1027      Pre Biometrics   Waist Circumference  51 inches    Hip Circumference  49.5 inches    Waist to Hip Ratio  1.03 %    Triceps Skinfold  29 mm    % Body Fat  40.7 %    Grip Strength  39 kg    Flexibility  7.5 in    Single Leg Stand  4.45 seconds        Nutrition Therapy Plan and  Nutrition Goals: Nutrition Therapy & Goals - 06/04/17 0908      Nutrition Therapy   Diet  Carb Modified, Therapeutic Lifestyle Changes      Personal Nutrition Goals   Nutrition Goal  Pt to identify food quantities necessary to achieve weight loss of 6-24 lb (2.7-10.9 kg) at graduation from cardiac rehab.    Personal Goal #2  Pt able to name foods that affect blood glucose      Intervention Plan   Intervention  Prescribe, educate and counsel regarding individualized specific dietary modifications aiming towards targeted core components such as weight, hypertension, lipid management, diabetes, heart failure and other comorbidities.    Expected Outcomes  Short Term Goal: Understand basic principles of dietary content, such as calories, fat, sodium, cholesterol and nutrients.;Long Term Goal: Adherence to prescribed nutrition plan.       Nutrition Discharge: Nutrition Scores: Nutrition Assessments - 06/20/17 0940      MEDFICTS Scores   Pre Score  23       Nutrition Goals Re-Evaluation:   Nutrition Goals Re-Evaluation:   Nutrition Goals Discharge (Final Nutrition Goals Re-Evaluation):   Psychosocial: Target Goals: Acknowledge presence or absence of significant depression and/or stress, maximize coping skills, provide positive support system. Participant is able to verbalize types and ability to use techniques and skills needed for reducing stress and depression.  Initial Review & Psychosocial Screening: Initial Psych Review & Screening - 05/29/17 1253      Barriers   Psychosocial barriers to participate  in program  The patient should benefit from training in stress management and relaxation.      Screening Interventions   Interventions  Encouraged to exercise       Quality of Life Scores: Quality of Life - 05/29/17 1029      Quality of Life Scores   Health/Function Pre  29.11 %    Socioeconomic Pre  29.38 %    Psych/Spiritual Pre  30 %    Family Pre  30 %    GLOBAL Pre  29.49 %       PHQ-9: Recent Review Flowsheet Data    Depression screen [facility] 2/9 06/02/2017 01/17/2017 12/20/2014 11/12/2013   Decreased Interest 0 0 0 0   Down, Depressed, Hopeless 0 0 0 0   PHQ - 2 Score 0 0 0 0     Interpretation of Total Score  Total Score Depression Severity:  1-4 = Minimal depression, 5-9 = Mild depression, 10-14 = Moderate depression, 15-19 = Moderately severe depression, 20-27 = Severe depression   Psychosocial Evaluation and Intervention: Psychosocial Evaluation - 06/02/17 1631      Psychosocial Evaluation & Interventions   Interventions  Encouraged to exercise with the program and follow exercise prescription;Stress management education;Relaxation education    Comments  no psychosocial needs identified, no interventions necessary     Expected Outcomes  pt will exhibit positive outlook with good coping skills.    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 otherwise no  psychosocial needs identified, no interventions necessary     Expected Outcomes  pt will exhibit positive outlook with good coping skills.   pt will exhibit positive outlook with good coping skills.   pt will exhibit positive outlook with good coping skills.      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 otherwise no psychosocial needs identified, no interventions necessary    Expected Outcomes  pt will exhibit positive outlook with good coping skills.     Interventions  Encouraged to attend Cardiac Rehabilitation for the exercise;Stress management education;Relaxation education    Continue Psychosocial Services   No Follow up required       Vocational Rehabilitation: Provide vocational rehab assistance to qualifying candidates.   Vocational Rehab Evaluation & Intervention: Vocational Rehab - 05/29/17 1225      Initial Vocational Rehab Evaluation & Intervention   Assessment shows need for Vocational Rehabilitation  No       Education: Education Goals: Education classes will be provided on a  weekly basis, covering required topics. Participant will state understanding/return demonstration of topics presented.  Learning Barriers/Preferences: Learning Barriers/Preferences - 05/29/17 1030      Learning Barriers/Preferences   Learning Barriers  Sight       Education Topics: Count Your Pulse:  -Group instruction provided by verbal instruction, demonstration, patient participation and written materials to support subject.  Instructors address importance of being able to find your pulse and how to count your pulse when at home without a heart monitor.  Patients get hands on experience counting their pulse with staff help and individually.   CARDIAC REHAB PHASE II EXERCISE from 07/25/2017 in [place]  Date  06/27/17  Instruction Review Code  2- meets goals/outcomes      Heart Attack, Angina, and Risk Factor Modification:  -Group instruction provided by verbal instruction, video, and written materials to support subject.  Instructors address signs and symptoms of angina and heart attacks.    Also discuss risk factors for heart disease and how to make changes to improve heart health risk factors.   CARDIAC REHAB PHASE II EXERCISE from 07/25/2017 in [place]  Date  06/25/17  Instruction Review Code  2- meets goals/outcomes      Functional Fitness:  -Group instruction provided by verbal instruction, demonstration, patient participation, and written materials to support subject.  Instructors address safety measures for doing things around the house.  Discuss how to get up and down off the floor,  how to pick things up properly, how to safely get out of a chair without assistance, and balance training.   CARDIAC REHAB PHASE II EXERCISE from 07/25/2017 in [place]  Date  07/11/17  Instruction Review Code  2- meets goals/outcomes      Meditation and Mindfulness:  -Group instruction  provided by verbal instruction, patient participation, and written materials to support subject.  Instructor addresses importance of mindfulness and meditation practice to help reduce stress and improve awareness.  Instructor also leads participants through a meditation exercise.    Stretching for Flexibility and Mobility:  -Group instruction provided by verbal instruction, patient participation, and written materials to support subject.  Instructors lead participants through series of stretches that are designed to increase flexibility thus improving mobility.  These stretches are additional exercise for major muscle groups that are typically performed during regular warm up and cool down.   CARDIAC REHAB PHASE II EXERCISE from 07/25/2017 in [place]  Date  07/25/17  Instruction Review Code  2- meets goals/outcomes      Hands Only CPR:  -Group verbal, video, and participation provides a basic overview of AHA guidelines for community CPR. Role-play of emergencies allow participants the opportunity to practice calling for help and chest compression technique with discussion of AED use.   Hypertension: -Group verbal and written instruction that provides a basic overview of hypertension including the most recent diagnostic guidelines, risk factor reduction with self-care instructions and medication management.    Nutrition I class: Heart Healthy Eating:  -Group instruction provided by PowerPoint slides, verbal discussion, and written materials to support subject matter. The instructor gives an explanation and review of the Therapeutic Lifestyle Changes diet recommendations, which includes a discussion on lipid goals, dietary fat, sodium, fiber, plant stanol/sterol esters, sugar, and the components of a well-balanced, healthy diet.   Nutrition II class: Lifestyle Skills:  -Group instruction provided by PowerPoint slides, verbal discussion, and written materials  to support subject matter. The instructor gives an explanation and review of label reading, grocery shopping for heart health, heart healthy recipe modifications, and ways to make healthier choices when eating out.   CARDIAC REHAB PHASE II EXERCISE from 07/25/2017 in [place]  Date  06/10/17  Educator  RD  Instruction Review Code  2- meets goals/outcomes      Diabetes Question & Answer:  -Group instruction provided by PowerPoint slides, verbal discussion, and written materials to support subject matter. The instructor gives an explanation and review of diabetes co-morbidities, pre- and post-prandial blood glucose goals, pre-exercise blood glucose goals, signs, symptoms, and treatment of hypoglycemia and hyperglycemia, and foot care basics.   CARDIAC REHAB PHASE II EXERCISE from 07/25/2017 in [place]  Date  06/06/17  Educator  RD  Instruction Review Code  2- meets goals/outcomes      Diabetes Blitz:  -Group instruction provided by PowerPoint slides, verbal discussion, and written materials to support subject matter. The instructor gives an explanation and review of the physiology behind type 1 and type 2 diabetes, diabetes medications and rational behind using different medications, pre- and post-prandial blood glucose recommendations and Hemoglobin A1c goals, diabetes diet, and exercise including blood glucose guidelines for exercising safely.    Portion Distortion:  -Group instruction provided by PowerPoint slides, verbal discussion, written materials, and food models to support subject matter. The instructor gives an explanation of serving size versus portion size, changes in  portions sizes over the last 20 years, and what consists of a serving from each food group.   CARDIAC REHAB PHASE II EXERCISE from 07/25/2017 in [place]  Date  06/11/17  Educator  RD  Instruction Review Code  2-  meets goals/outcomes      Stress Management:  -Group instruction provided by verbal instruction, video, and written materials to support subject matter.  Instructors review role of stress in heart disease and how to cope with stress positively.     CARDIAC REHAB PHASE II EXERCISE from 07/25/2017 in [place]  Date  06/20/17  Instruction Review Code  2- meets goals/outcomes      Exercising on Your Own:  -Group instruction provided by verbal instruction, power point, and written materials to support subject.  Instructors discuss benefits of exercise, components of exercise, frequency and intensity of exercise, and end points for exercise.  Also discuss use of nitroglycerin and activating EMS.  Review options of places to exercise outside of rehab.  Review guidelines for sex with heart disease.   Cardiac Drugs I:  -Group instruction provided by verbal instruction and written materials to support subject.  Instructor reviews cardiac drug classes: antiplatelets, anticoagulants, beta blockers, and statins.  Instructor discusses reasons, side effects, and lifestyle considerations for each drug class.   CARDIAC REHAB PHASE II EXERCISE from 07/25/2017 in [place]  Date  07/09/17  Instruction Review Code  2- meets goals/outcomes      Cardiac Drugs II:  -Group instruction provided by verbal instruction and written materials to support subject.  Instructor reviews cardiac drug classes: angiotensin converting enzyme inhibitors (ACE-I), angiotensin II receptor blockers (ARBs), nitrates, and calcium channel blockers.  Instructor discusses reasons, side effects, and lifestyle considerations for each drug class.   CARDIAC REHAB PHASE II EXERCISE from 07/25/2017 in [place]  Date  06/04/17  Instruction Review Code  2- meets goals/outcomes      Anatomy and Physiology of the Circulatory System:  Group  verbal and written instruction and models provide basic cardiac anatomy and physiology, with the coronary electrical and arterial systems. Review of: AMI, Angina, Valve disease, Heart Failure, Peripheral Artery Disease, Cardiac Arrhythmia, Pacemakers, and the ICD.   CARDIAC REHAB PHASE II EXERCISE from 07/25/2017 in [place]  Date  07/23/17  Instruction Review Code  2- meets goals/outcomes      Other Education:  -Group or individual verbal, written, or video instructions that support the educational goals of the cardiac rehab program.   CARDIAC REHAB PHASE II EXERCISE from 07/25/2017 in [place]  Date  07/04/17 [[facility]]  Educator  RD  Instruction Review Code  2- Demonstrated Understanding      Knowledge Questionnaire Score: Knowledge Questionnaire Score - 05/29/17 1030      Knowledge Questionnaire Score   Pre Score  22/24       Core Components/Risk Factors/Patient Goals at Admission: Personal Goals and Risk Factors at Admission - 06/02/17 0922      Core Components/Risk Factors/Patient Goals on Admission    Weight Management  Yes;Weight Maintenance;Weight Loss;Obesity    Intervention  Weight Management: Develop a combined nutrition and exercise program designed to reach desired caloric intake, while maintaining appropriate intake of nutrient and fiber, sodium and fats, and appropriate energy expenditure required for the weight goal.    Admit Weight  262 lb 9.1  oz (119.1 kg)    Goal Weight: Short Term  255 lb (115.7 kg)    Goal Weight: Long Term  250 lb (113.4 kg)    Expected Outcomes  Short Term: Continue to assess and modify interventions until short term weight is achieved;Weight Maintenance: Understanding of the daily nutrition guidelines, which includes 25-35% calories from fat, 7% or less cal from saturated fats, less than 200mg  cholesterol, less than 1.5gm of sodium, & 5 or more servings of fruits and  vegetables daily;Weight Loss: Understanding of general recommendations for a balanced deficit meal plan, which promotes 1-2 lb weight loss per week and includes a negative energy balance of [phone] kcal/d;Understanding recommendations for meals to include 15-35% energy as protein, 25-35% energy from fat, 35-60% energy from carbohydrates, less than 200mg  of dietary cholesterol, 20-35 gm of total fiber daily;Understanding of distribution of calorie intake throughout the day with the consumption of 4-5 meals/snacks    Intervention  Provide a combined exercise and nutrition program that is supplemented with education, support and counseling about heart failure. Directed toward relieving symptoms such as shortness of breath, decreased exercise tolerance, and extremity edema.    Expected Outcomes  Improve functional capacity of life;Short term: Attendance in program 2-3 days a week with increased exercise capacity. Reported lower sodium intake. Reported increased fruit and vegetable intake. Reports medication compliance.;Short term: Daily weights obtained and reported for increase. Utilizing diuretic protocols set by physician.;Long term: Adoption of self-care skills and reduction of barriers for early signs and symptoms recognition and intervention leading to self-care maintenance.    Hypertension  Yes    Intervention  Provide education on lifestyle modifcations including regular physical activity/exercise, weight management, moderate sodium restriction and increased consumption of fresh fruit, vegetables, and low fat dairy, alcohol moderation, and smoking cessation.;Monitor prescription use compliance.    Expected Outcomes  Short Term: Continued assessment and intervention until BP is < 140/55mm HG in hypertensive participants. < 130/71mm HG in hypertensive participants with diabetes, heart failure or chronic kidney disease.;Long Term: Maintenance of blood pressure at goal levels.    Lipids  Yes    Intervention   Provide education and support for participant on nutrition & aerobic/resistive exercise along with prescribed medications to achieve LDL 70mg , HDL >40mg .    Expected Outcomes  Short Term: Participant states understanding of desired cholesterol values and is compliant with medications prescribed. Participant is following exercise prescription and nutrition guidelines.;Long Term: Cholesterol controlled with medications as prescribed, with individualized exercise RX and with personalized nutrition plan. Value goals: LDL < 70mg , HDL > 40 mg.    Stress  Yes    Intervention  Offer individual and/or small group education and counseling on adjustment to heart disease, stress management and health-related lifestyle change. Teach and support self-help strategies.;Refer participants experiencing significant psychosocial distress to appropriate mental health specialists for further evaluation and treatment. When possible, include family members and significant others in education/counseling sessions.    Expected Outcomes  Short Term: Participant demonstrates changes in health-related behavior, relaxation and other stress management skills, ability to obtain effective social support, and compliance with psychotropic medications if prescribed.;Long Term: Emotional wellbeing is indicated by absence of clinically significant psychosocial distress or social isolation.    Personal Goal Other  Yes    Personal Goal  pt personal goals for cardaic rehab are to learn safe, appropriate activity parameters.  pt would like to continue weight loss until goal wt 250lb achieved.     Intervention  provide exercise, nutrition and  lifestyle modification education to promote safe exercise, activity levels and weight loss goals.      Expected Outcomes  pt wil participate in CR exercise, nutrition and lifestyle modification education opportunities       Core Components/Risk Factors/Patient Goals Review:  Goals and Risk Factor Review     Row Name 06/03/17 1641 06/24/17 1511 06/27/17 1056 07/22/17 1609       Core Components/Risk Factors/Patient Goals Review   Personal Goals Review  Weight Management/Obesity;Heart Failure;Hypertension;Lipids;Stress  Weight Management/Obesity;Heart Failure;Hypertension;Lipids;Stress  Weight Management/Obesity;Heart Failure;Hypertension;Lipids;Stress  Weight Management/Obesity;Heart Failure;Hypertension;Lipids;Stress    Review  pt with multiple CAD RF demonstrates eagerness to participate in CR.  pt with multiple CAD RF demonstrates eagerness to participate in CR.  pt with multiple CAD RF demonstrates eagerness to participate in CR. pt pleased with his increased flexability since CR participation.  pt with multiple CAD RF demonstrates eagerness to participate in CR. pt is walking at home for exercise. pt reports increased strength/stamina to complete home tasks.     Expected Outcomes  pt will participate in CR exercise, nutrition and lifestyle modification opportunities.   pt will participate in CR exercise, nutrition and lifestyle modification opportunities.   pt will participate in CR exercise, nutrition and lifestyle modification opportunities.   pt will participate in CR exercise, nutrition and lifestyle modification opportunities.        Core Components/Risk Factors/Patient Goals at Discharge (Final Review):  Goals and Risk Factor Review - 07/22/17 1609      Core Components/Risk Factors/Patient Goals Review   Personal Goals Review  Weight Management/Obesity;Heart Failure;Hypertension;Lipids;Stress    Review  pt with multiple CAD RF demonstrates eagerness to participate in CR. pt is walking at home for exercise. pt reports increased strength/stamina to complete home tasks.     Expected Outcomes  pt will participate in CR exercise, nutrition and lifestyle modification opportunities.        ITP Comments: ITP Comments    Row Name 05/29/17 0800 06/03/17 1639 06/24/17 1511 07/22/17 1608     ITP  Comments  Medical Director, Dr. Fransico Him  30day ITP review.  pt started group exercise 06/02/2017.  pt demonstrates eagerness to participate in program  30day ITP review.  pt started group exercise 06/02/2017.  pt demonstrates eagerness to participate in program  30day ITP review.  pt with good attendance and participation.  pt demonstrates eagerness to participate in program       Comments:

## 2017-07-28 ENCOUNTER — Encounter (HOSPITAL_COMMUNITY)
Admission: RE | Admit: 2017-07-28 | Discharge: 2017-07-28 | Disposition: A | Discharge status: Home / Self Care | Payer: Medicare Other | Source: Ambulatory Visit | Attending: Cardiovascular Disease | Admitting: Cardiovascular Disease

## 2017-07-28 DIAGNOSIS — Z952 Presence of prosthetic heart valve: Secondary | ICD-10-CM | POA: Insufficient documentation

## 2017-07-28 DIAGNOSIS — Z48812 Encounter for surgical aftercare following surgery on the circulatory system: Secondary | ICD-10-CM | POA: Diagnosis not present

## 2017-07-30 ENCOUNTER — Encounter (HOSPITAL_COMMUNITY)
Admission: RE | Admit: 2017-07-30 | Discharge: 2017-07-30 | Disposition: A | Discharge status: Home / Self Care | Payer: Medicare Other | Source: Ambulatory Visit | Attending: Cardiovascular Disease | Admitting: Cardiovascular Disease

## 2017-07-30 DIAGNOSIS — Z952 Presence of prosthetic heart valve: Secondary | ICD-10-CM

## 2017-07-30 DIAGNOSIS — Z48812 Encounter for surgical aftercare following surgery on the circulatory system: Secondary | ICD-10-CM | POA: Diagnosis not present

## 2017-07-31 ENCOUNTER — Encounter: Payer: Self-pay | Admitting: Pulmonary Disease

## 2017-07-31 ENCOUNTER — Ambulatory Visit (INDEPENDENT_AMBULATORY_CARE_PROVIDER_SITE_OTHER): Payer: Medicare Other | Admitting: Pulmonary Disease

## 2017-07-31 ENCOUNTER — Ambulatory Visit (INDEPENDENT_AMBULATORY_CARE_PROVIDER_SITE_OTHER): Payer: Medicare Other | Admitting: Family Medicine

## 2017-07-31 ENCOUNTER — Encounter: Payer: Self-pay | Admitting: Family Medicine

## 2017-07-31 VITALS — BP 138/78 | HR 77 | Ht 67.0 in | Wt 261.1 lb

## 2017-07-31 VITALS — BP 130/70 | HR 44 | Temp 97.7°F | Wt 262.0 lb

## 2017-07-31 DIAGNOSIS — R600 Localized edema: Secondary | ICD-10-CM | POA: Diagnosis not present

## 2017-07-31 DIAGNOSIS — J302 Other seasonal allergic rhinitis: Secondary | ICD-10-CM | POA: Diagnosis not present

## 2017-07-31 DIAGNOSIS — Z951 Presence of aortocoronary bypass graft: Secondary | ICD-10-CM | POA: Diagnosis not present

## 2017-07-31 DIAGNOSIS — K219 Gastro-esophageal reflux disease without esophagitis: Secondary | ICD-10-CM | POA: Diagnosis not present

## 2017-07-31 DIAGNOSIS — I10 Essential (primary) hypertension: Secondary | ICD-10-CM

## 2017-07-31 DIAGNOSIS — R0981 Nasal congestion: Secondary | ICD-10-CM

## 2017-07-31 DIAGNOSIS — E039 Hypothyroidism, unspecified: Secondary | ICD-10-CM | POA: Diagnosis not present

## 2017-07-31 DIAGNOSIS — R05 Cough: Secondary | ICD-10-CM

## 2017-07-31 DIAGNOSIS — M159 Polyosteoarthritis, unspecified: Secondary | ICD-10-CM

## 2017-07-31 DIAGNOSIS — I5020 Unspecified systolic (congestive) heart failure: Secondary | ICD-10-CM

## 2017-07-31 DIAGNOSIS — M150 Primary generalized (osteo)arthritis: Secondary | ICD-10-CM | POA: Diagnosis not present

## 2017-07-31 DIAGNOSIS — R059 Cough, unspecified: Secondary | ICD-10-CM

## 2017-07-31 MED ORDER — ISOSORBIDE MONONITRATE ER 30 MG PO TB24: 30.0000 mg | ORAL_TABLET | Freq: Every day | ORAL | 3 refills | Status: DC

## 2017-07-31 MED ORDER — IPRATROPIUM BROMIDE 0.03 % NA SOLN: 2.0000 | Freq: Four times a day (QID) | NASAL | 4 refills | Status: DC | PRN

## 2017-07-31 MED ORDER — BENZONATATE 200 MG PO CAPS: 200.0000 mg | ORAL_CAPSULE | Freq: Two times a day (BID) | ORAL | 3 refills | Status: DC

## 2017-07-31 MED ORDER — LEVOTHYROXINE SODIUM 75 MCG PO TABS: 75.0000 ug | ORAL_TABLET | Freq: Every day | ORAL | 3 refills | Status: DC

## 2017-07-31 MED ORDER — AZITHROMYCIN 250 MG PO TABS: 250.0000 mg | ORAL_TABLET | ORAL | 1 refills | Status: DC

## 2017-07-31 NOTE — Progress Notes (Signed)
Subjective:    Patient ID: Justin [name], male    DOB: 06/07/1934, 83 y.o.   MRN: [id number]  Synopsis: Former patient of Dr. Gwenette Greet who has irritable larynx syndrome as well as mild persistent asthma.' May 2017 CT scan of the sinuses was a poor study, there is no clear acute or chronic sinusitis noted. Previously treated with immunotherapy by Dr. Velora Heckler many years prior.   HPI  Chief Complaint  Patient presents with  . Follow-up    nose bleeds    [name] and his wife are about to go on a trip around the world on a sailing cruise for 5 months.  He has been dealing with epistaxis lately, sometimes its dried blood, sometimes its fresh blood.  It always happens in the mornings.  He has been using neosporin reguarly.  He says that he has a constant runny nose and has to blow his nose a lot.  This has really started since he started his blood thinners.  He would like to have an Rx for a Zpack to have on hand.  He is taking multiple antihistamines per day, zyrtec and chlorpheniramine.  He is taking Dymista as prescribed lately as well.    He has some acid reflux right now.  He has some mucus in the back of his throat.     Past Medical History:  Diagnosis Date  . Age-related macular degeneration, dry, both eyes   . Allergic    "24/7; 365 days/year; I'm allergic to pollens, dust, all southern grasses/trees, mold, mildue, cats, dogs" (01/07/2017)  . Asthma    sees [name]   . Benign prostatic hypertrophy    (sees Dr. Denman George  . CAD (coronary artery disease)    a. s/p CABG 2007. b. Cath 08/2016 - 4/4 patent grafts.  . Carotid bruit    carotid u/s 10/10: 0.39% bilaterally  . Chronic combined systolic and diastolic CHF (congestive heart failure) ([place])   . Complication of anesthesia 1980s   "w/anal cyst OR, he gave me a saddle block then put a narcotic in spinal cord; had a severe reaction to that" (01/07/2017)  . ED (erectile dysfunction)   . Family history of adverse reaction  to anesthesia    "daughter wakes up during OR" (01/07/2017)  . GERD (gastroesophageal reflux disease)   . Gout   . HTN (hypertension)   . Hx of colonic polyps    (sees Dr. Henrene Pastor)  . Hyperlipidemia   . Hypothyroidism   . Moderate to severe aortic stenosis    a. s/p TAVR 02/2017.  [name] Myocardial infarction ([place]) ~ 2000  . Obesity   . Osteoarthritis    "was in my knees, hands" (01/07/2017 )  . PAF (paroxysmal atrial fibrillation) ([place])    a. documented post TAVR.  [name] Precancerous skin lesion    (sees Dr. Allyson Sabal)  . Prostate cancer ([place]) dx'd ~ 2014  . 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  HENT: Negative for postnasal drip, rhinorrhea and sinus pressure.   Respiratory: Positive for cough. Negative for shortness of breath and wheezing.   Cardiovascular: Negative for chest pain, palpitations and leg swelling.       Objective:   Physical Exam  Vitals:   07/31/17 0911  BP: 138/78  Pulse: 77  SpO2: 98%  Weight: 261 lb 2 oz (118.4 kg)  Height: 5\' 7"  (1.702 m)    RA  Gen: well appearing HENT: OP clear, TM's clear, neck supple PULM: CTA B, normal percussion CV: RRR, no mgr, trace edema GI: BS+, soft, nontender Derm: no cyanosis or rash Psyche: normal mood and affect     CBC    Component Value Date/Time   WBC 10.5 04/23/2017 1012   WBC 14.4 (H) 03/07/2017 0231   RBC 4.99 04/23/2017 1012   RBC 4.59 03/07/2017 0231   HGB 13.9 04/23/2017 1012   HCT 42.1 04/23/2017 1012   PLT 241 04/23/2017 1012   MCV 84 04/23/2017 1012   MCH 27.9 04/23/2017 1012   MCH 27.0 03/07/2017 0231   MCHC 33.0 04/23/2017 1012   MCHC 31.9 03/07/2017 0231   RDW 15.8 (H) 04/23/2017 1012   LYMPHSABS 2.0 04/23/2017 1012   MONOABS 1.5 (H) 01/07/2017 1705   EOSABS 0.3  04/23/2017 1012   BASOSABS 0.1 04/23/2017 1012   Imaging: 12/2015 CT sinuses without significant disease  Labs  12/2016 IgE > 180  Records from his recent trans-aortic valve replacement reviewed     Assessment & Plan:  Seasonal allergic rhinitis, unspecified trigger  Cough  Gastroesophageal reflux disease, esophagitis presence not specified  Nasal congestion  Discussion: Mr. Shieh continues to struggle with understanding of his medications and medication compliance despite many attempts on our and to go over his medication list.  I think we have made some improvement but he continues to have some cough and postnasal drip drainage.  I think that his cough and mucus in his throat is related to GERD allergic rhinitis, and vasomotor rhinitis.  He tells me today that he is willing to put up with a mild amount of epistaxis as long as it helps keep his allergic rhinitis under control.  So will not back off on any inhaled steroids or other nasal therapy and will have him use more saline at night to help with dryness to prevent the epistaxis.  Epistaxis (bloody nose): Use saline gel in your nostrils at night before going to bed  Allergic rhinitis: Take Zyrtec daily Use Dymista twice a day Continue montelukast  Vasomotor rhinitis: Use ipratropium nasal spray every 6 hours as needed for post nasal drip  Asthma: Continue taking Symbicort 2 puffs twice a day We will prescribe a Z-Pak for you to have available in case you get a case of bronchitis while you are traveling  Gastroesophageal reflux disease: Remember that it is very important to stay compliant with these medicines because this is what causes your cough and mucus in your throat Continue Prilosec in the morning's Continue Zantac at night  We will see you back in 6 months    > 50% of this 27 min visit spent face to face   Current Outpatient Medications:  .  aspirin 81 MG tablet, Take 1 tablet (81 mg total) by mouth  daily., Disp: 30 tablet, Rfl: 0 .  atorvastatin (LIPITOR) 20 MG tablet, TAKE 1 TABLET ONCE DAILY. (Patient taking differently: TAKE 1 TABLET ONCE DAILY IN THE EVENING), Disp: 90 tablet, Rfl: 3 .  Azelastine-Fluticasone 137-50 MCG/ACT SUSP, Place 2 sprays into both nostrils 2 (two) times daily., Disp: 2 Bottle, Rfl: 0 .  benzonatate (TESSALON) 200 MG capsule, Take 1 capsule (200 mg total) by mouth 2 (two) times daily as needed for cough. (Patient taking differently: Take 200 mg by mouth 2 (two) times  daily. ), Disp: 60 capsule, Rfl: 5 .  budesonide-formoterol (SYMBICORT) 80-4.5 MCG/ACT inhaler, Inhale 2 puffs into the lungs 2 (two) times daily., Disp: 1 Inhaler, Rfl: 12 .  chlorpheniramine (CHLOR-TRIMETON) 4 MG tablet, Take 8 mg by mouth 2 (two) times daily. , Disp: , Rfl:  .  dabigatran (PRADAXA) 150 MG CAPS capsule, Take 1 capsule (150 mg total) by mouth 2 (two) times daily., Disp: 180 capsule, Rfl: 3 .  DYMISTA 137-50 MCG/ACT SUSP, USE 2 SPRAYS EACH NOSTRIL TWICE DAILY., Disp: 23 g, Rfl: 11 .  furosemide (LASIX) 80 MG tablet, Take 80 mg daily alternating with 80 mg twice daily., Disp: 135 tablet, Rfl: 3 .  isosorbide mononitrate (IMDUR) 30 MG 24 hr tablet, Take 1 tablet (30 mg total) by mouth daily., Disp: 90 tablet, Rfl: 3 .  Ketotifen Fumarate (ZADITOR OP), Apply 1 drop to eye daily as needed (dry eyes)., Disp: , Rfl:  .  levothyroxine (SYNTHROID, LEVOTHROID) 75 MCG tablet, Take 1 tablet (75 mcg total) by mouth daily. (Patient taking differently: Take 75 mcg by mouth at bedtime. ), Disp: 90 tablet, Rfl: 3 .  losartan (COZAAR) 25 MG tablet, Take 1 tablet (25 mg total) by mouth daily., Disp: 90 tablet, Rfl: 3 .  metoprolol tartrate (LOPRESSOR) 25 MG tablet, Take 1 tablet (25 mg total) by mouth 2 (two) times daily., Disp: 180 tablet, Rfl: 3 .  montelukast (SINGULAIR) 10 MG tablet, Take 1 tablet (10 mg total) by mouth every evening., Disp: 90 tablet, Rfl: 4 .  Multiple Vitamins-Minerals (PRESERVISION  AREDS PO), Take 2 tablets by mouth every morning., Disp: , Rfl:  .  omeprazole (PRILOSEC) 20 MG capsule, Take 1 capsule (20 mg total) by mouth daily., Disp: 90 capsule, Rfl: 4 .  potassium chloride SA (K-DUR,KLOR-CON) 20 MEQ tablet, Take 2 tablets by mouth twice daily alternating with 2 tablets daily., Disp: 270 tablet, Rfl: 3 .  ranitidine (ZANTAC) 150 MG capsule, Take 150 mg by mouth every evening. , Disp: , Rfl:  .  triamcinolone cream (KENALOG) 0.1 %, Apply 1 application topically daily as needed for dry skin., Disp: , Rfl:  .  azithromycin (ZITHROMAX) 250 MG tablet, Take 1 tablet (250 mg total) by mouth as directed., Disp: 6 tablet, Rfl: 1 .  ipratropium (ATROVENT) 0.03 % nasal spray, Place 2 sprays into both nostrils every 6 (six) hours as needed for rhinitis., Disp: 30 mL, Rfl: 4

## 2017-07-31 NOTE — Progress Notes (Signed)
   Subjective:    Patient ID: Justin [name], male    DOB: 1933-10-29, 83 y.o.   MRN: [id number]  HPI Here to follow up on some medications prior to leaving on his 5 month cruise around the world. They will leave [facility] on 08-28-17. He feel great, and he has lost about 40 lbs over the course of this past year. His BP remains stable. He saw [name] this morning and he is doing well from a pulmonary standpoint. He will see Dr. [name] next week for a cardiology check.    Review of Systems  Constitutional: Negative.   Respiratory: Negative.   Cardiovascular: Negative.   Neurological: Negative.        Objective:   Physical Exam  Constitutional: He appears well-developed and well-nourished.  Neck: No thyromegaly present.  Cardiovascular: Normal rate, regular rhythm, normal heart sounds and intact distal pulses.  Pulmonary/Chest: Effort normal and breath sounds normal. No respiratory distress. He has no wheezes. He has no rales.  Musculoskeletal: He exhibits no edema.  Lymphadenopathy:    He has no cervical adenopathy.          Assessment & Plan:  He is doing well, and I think he is in great shape for the trip. Several medications were refilled.  [name], MD

## 2017-07-31 NOTE — Patient Instructions (Addendum)
Epistaxis (bloody nose): Use saline gel in your nostrils at night before going to bed  Allergic rhinitis: Take Zyrtec daily Use Dymista twice a day Continue montelukast  Vasomotor rhinitis: Use ipratropium nasal spray every 6 hours as needed for post nasal drip  Asthma: Continue taking Symbicort 2 puffs twice a day We will prescribe a Z-Pak for you to have available in case you get a case of bronchitis while you are traveling  Gastroesophageal reflux disease: Remember that it is very important to stay compliant with these medicines because this is what causes your cough and mucus in your throat Continue Prilosec in the morning's Continue Zantac at night  We will see you back in 6 months

## 2017-08-01 ENCOUNTER — Encounter (HOSPITAL_COMMUNITY)
Admission: RE | Admit: 2017-08-01 | Discharge: 2017-08-01 | Disposition: A | Discharge status: Home / Self Care | Payer: Medicare Other | Source: Ambulatory Visit | Attending: Cardiovascular Disease | Admitting: Cardiovascular Disease

## 2017-08-01 DIAGNOSIS — Z952 Presence of prosthetic heart valve: Secondary | ICD-10-CM

## 2017-08-01 DIAGNOSIS — Z48812 Encounter for surgical aftercare following surgery on the circulatory system: Secondary | ICD-10-CM | POA: Diagnosis not present

## 2017-08-04 ENCOUNTER — Encounter (HOSPITAL_COMMUNITY): Payer: Medicare Other

## 2017-08-06 ENCOUNTER — Encounter: Payer: Medicare Other | Admitting: Internal Medicine

## 2017-08-06 ENCOUNTER — Encounter (HOSPITAL_COMMUNITY): Payer: Medicare Other

## 2017-08-06 ENCOUNTER — Encounter (HOSPITAL_COMMUNITY)
Admission: RE | Admit: 2017-08-06 | Discharge: 2017-08-06 | Disposition: A | Discharge status: Home / Self Care | Payer: Medicare Other | Source: Ambulatory Visit | Attending: Cardiovascular Disease | Admitting: Cardiovascular Disease

## 2017-08-06 DIAGNOSIS — Z952 Presence of prosthetic heart valve: Secondary | ICD-10-CM | POA: Diagnosis not present

## 2017-08-06 DIAGNOSIS — Z48812 Encounter for surgical aftercare following surgery on the circulatory system: Secondary | ICD-10-CM | POA: Diagnosis not present

## 2017-08-08 ENCOUNTER — Encounter (HOSPITAL_COMMUNITY)
Admission: RE | Admit: 2017-08-08 | Discharge: 2017-08-08 | Disposition: A | Discharge status: Home / Self Care | Payer: Medicare Other | Source: Ambulatory Visit | Attending: Cardiovascular Disease | Admitting: Cardiovascular Disease

## 2017-08-08 ENCOUNTER — Telehealth (HOSPITAL_COMMUNITY): Payer: Self-pay | Admitting: Cardiac Rehabilitation

## 2017-08-08 DIAGNOSIS — Z48812 Encounter for surgical aftercare following surgery on the circulatory system: Secondary | ICD-10-CM | POA: Diagnosis not present

## 2017-08-08 DIAGNOSIS — Z952 Presence of prosthetic heart valve: Secondary | ICD-10-CM

## 2017-08-08 NOTE — Telephone Encounter (Addendum)
No new recommendations. Pt made aware. Andi Hence, RN, BSN Cardiac Pulmonary Rehab

## 2017-08-08 NOTE — Progress Notes (Signed)
OUTPATIENT CARDIAC REHAB  Primary Cardiologist:Dr. McAlhany  Pt reported at cardiac rehab he has had recent episodes of dizziness at home, associated with flushing, unlike his usual vertigo symptoms, no syncope or presyncope.  Denies pain, dyspnea or tachypalpitations. Pt able to exercise at cardiac rehab without difficulty or symptoms.    Orthostatic VS:  BP: 125/58   HR:  68   Lying   BP: 118/57 HR:  74   Sitting   BP: 109/63  HR:  75   Standing    Pt has upcoming appt with Dr. [name] next Tuesday. Dr. Angelena Form made  aware since recently  lasix dosage adjusted to 80mg  BID every other day at recent office visit. Pt does have continued lower extremity edema. I have also recommended compression stockings to him.  Andi Hence, RN, BSN-Cardiac Pulmonary Rehab 08/08/17  10:07 AM

## 2017-08-08 NOTE — Telephone Encounter (Signed)
-----   Message from Burnell Blanks, MD sent at 08/08/2017 11:55 AM EST ----- Regarding: RE: cardiac rehab  Thanks Joann.   ----- Message ----- From: Lowell Guitar, RN Sent: 08/08/2017   9:22 AM To: Burnell Blanks, MD Subject: cardiac rehab                                  Dear Dr. Angelena Form,  Pt reported at cardiac rehab he has had recent episodes of dizziness at home, associated with flushing, unlike his usual vertigo symptoms, no syncope or presyncope.    Denies pain, dyspnea or tachypalpitations.  Pt able to exercise at cardiac rehab without difficulty or symptoms.   Orthostatic VS: BP:  125/58     HR:    68     Lying  BP:  118/57  HR:   74     Sitting  BP:  109/63   HR:   75     Standing    Pt has upcoming appt with Dr. [name] next Tuesday.  However I wanted to make you aware since you recently adjusted his lasix dosage to 80mg  BID every other day.    Pt does have continued lower extremity edema.  I have also  recommended compression stockings to him.    Thank you, Andi Hence, RN, BSN Cardiac Pulmonary Rehab

## 2017-08-11 ENCOUNTER — Other Ambulatory Visit: Payer: Self-pay | Admitting: Family Medicine

## 2017-08-11 ENCOUNTER — Encounter (HOSPITAL_COMMUNITY)
Admission: RE | Admit: 2017-08-11 | Discharge: 2017-08-11 | Disposition: A | Discharge status: Home / Self Care | Payer: Medicare Other | Source: Ambulatory Visit | Attending: Cardiovascular Disease | Admitting: Cardiovascular Disease

## 2017-08-11 DIAGNOSIS — Z952 Presence of prosthetic heart valve: Secondary | ICD-10-CM | POA: Diagnosis not present

## 2017-08-11 DIAGNOSIS — Z48812 Encounter for surgical aftercare following surgery on the circulatory system: Secondary | ICD-10-CM | POA: Diagnosis not present

## 2017-08-11 NOTE — Progress Notes (Signed)
Daily Session Note  Patient Details  Name: Justin [name] MRN: [id number] Date of Birth: [birth year] Referring Provider:     CARDIAC REHAB PHASE II ORIENTATION from 05/29/2017 in [place]  Referring Provider  Darlina Guys      Encounter Date: 08/11/2017  Check In: Session Check In - 08/11/17 9622      Check-In   Location  [facility]    Staff Present  Dorna Bloom, MS, ACSM RCEP, Exercise Physiologist;Joann Rion, RN, Deland Pretty, MS, ACSM CEP, Exercise Physiologist;Tyara Carol Ada, MS,ACSM CEP, Exercise Physiologist    Supervising physician immediately available to respond to emergencies  [facility] immediately available    Physician(s)  Dr. Eliseo Squires    Medication changes reported      No    Fall or balance concerns reported     No    Tobacco Cessation  No Change    Warm-up and Cool-down  Performed as group-led instruction    Resistance Training Performed  Yes    VAD Patient?  No      Pain Assessment   Currently in Pain?  No/denies    Multiple Pain Sites  No       Capillary Blood Glucose: No results found for this or any previous visit (from the past 24 hour(s)).    Social History   Tobacco Use  Smoking Status Former Smoker  . Packs/day: 3.50  . Years: 13.00  . Pack years: 45.50  . Types: Cigarettes  . Last attempt to quit: 1963  . Years since quitting: 55.9  Smokeless Tobacco Never Used    Goals Met:  Exercise tolerated well  Goals Unmet:  Not Applicable  Comments: pt medication list reconciled at pt request.  No recent changes or concerns with medications.    Dr. Fransico Him is Medical Director for Cardiac Rehab at [facility].

## 2017-08-12 ENCOUNTER — Other Ambulatory Visit: Payer: Self-pay | Admitting: Family Medicine

## 2017-08-12 ENCOUNTER — Encounter: Payer: Self-pay | Admitting: Internal Medicine

## 2017-08-12 ENCOUNTER — Encounter (HOSPITAL_COMMUNITY): Payer: Self-pay

## 2017-08-12 ENCOUNTER — Ambulatory Visit: Payer: Medicare Other | Admitting: Internal Medicine

## 2017-08-12 VITALS — BP 125/51 | HR 68 | Ht 67.0 in | Wt 265.0 lb

## 2017-08-12 DIAGNOSIS — I428 Other cardiomyopathies: Secondary | ICD-10-CM | POA: Diagnosis not present

## 2017-08-12 DIAGNOSIS — I5020 Unspecified systolic (congestive) heart failure: Secondary | ICD-10-CM

## 2017-08-12 IMAGING — CR DG CHEST 2V
2 series · 2 of 2 positions shown · non-contrast
Comparison: Chest x-ray of January 08, 2017

CLINICAL DATA: Preoperative examination prior to aortic valve
replacement.

EXAM:
CHEST  2 VIEW

[w chest pa]
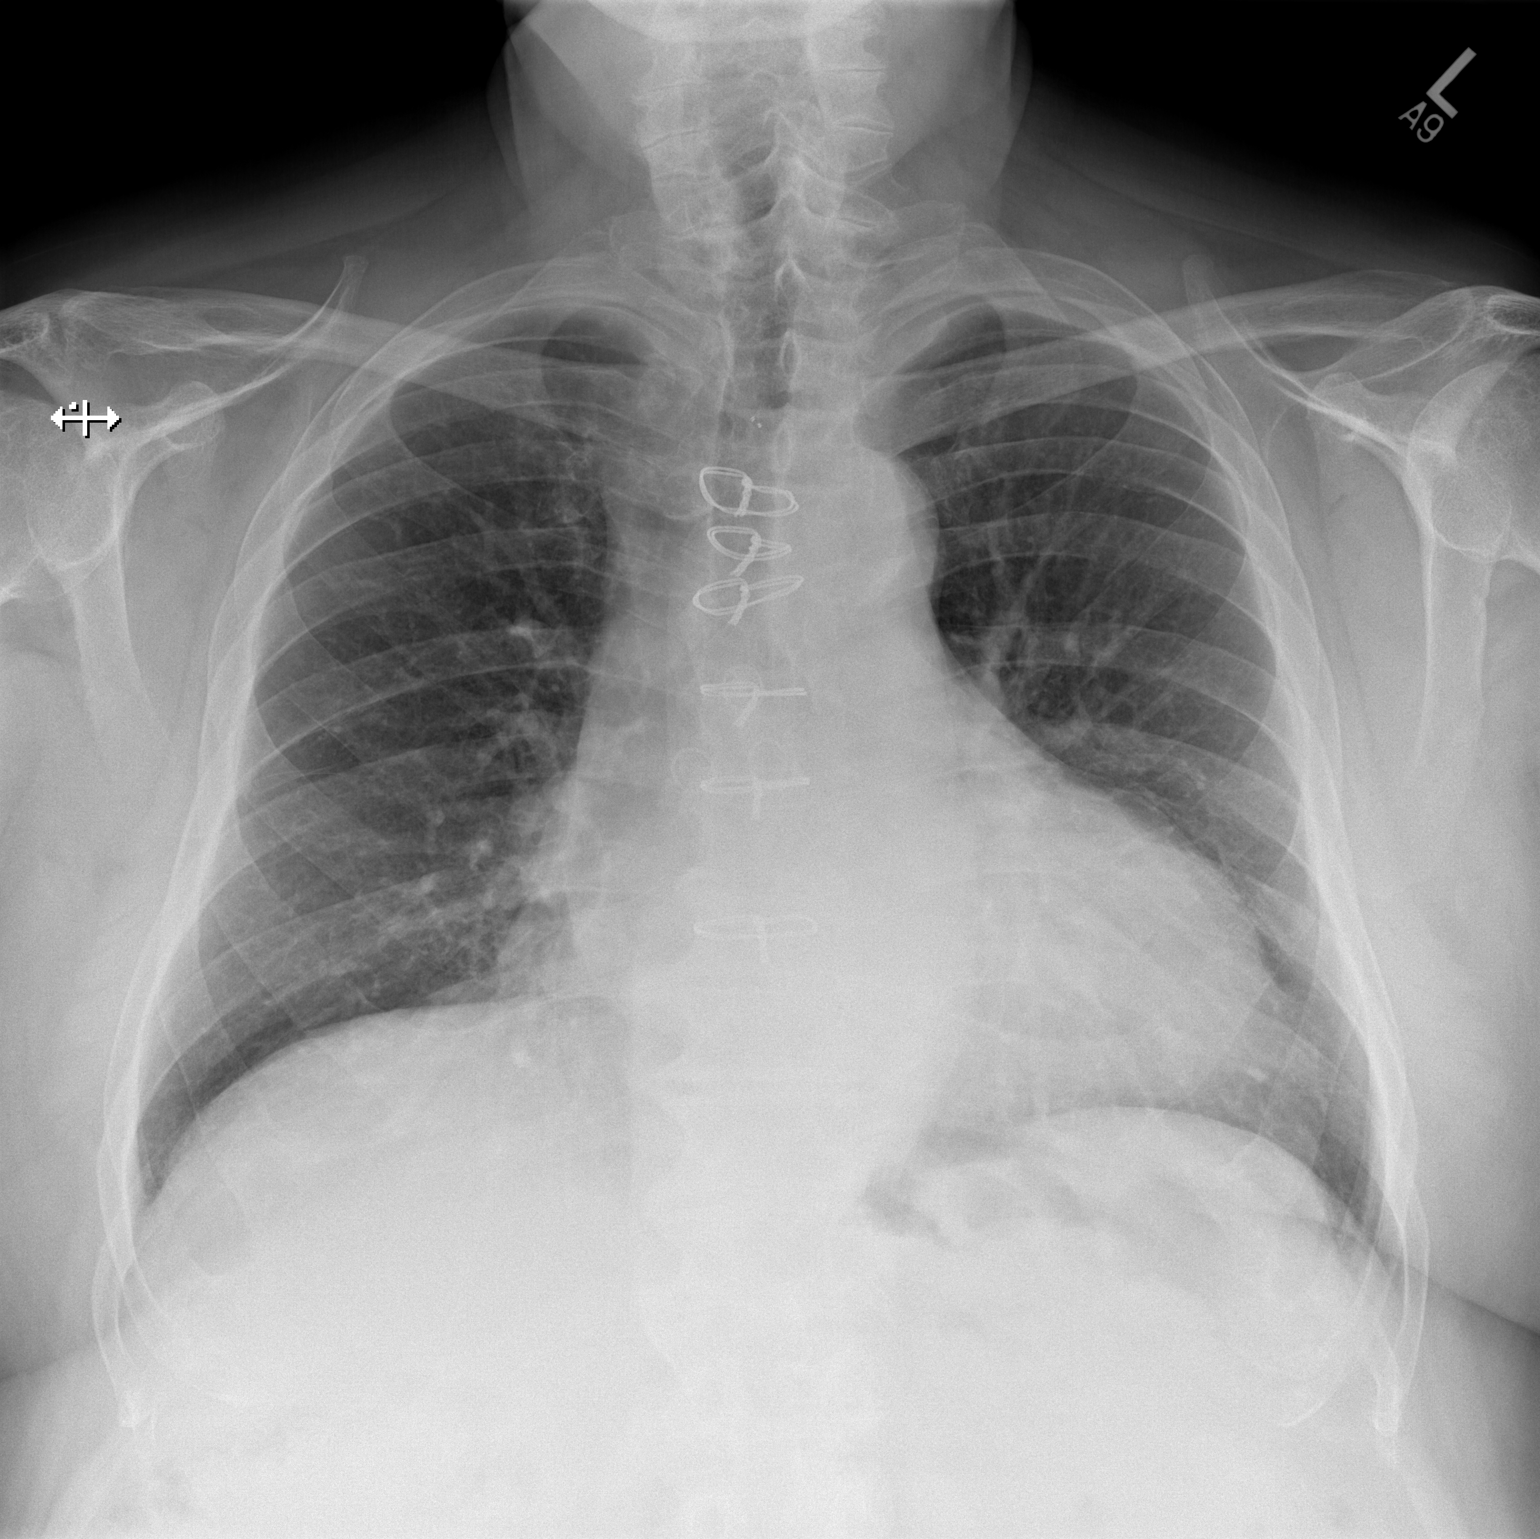

[w chest lat]
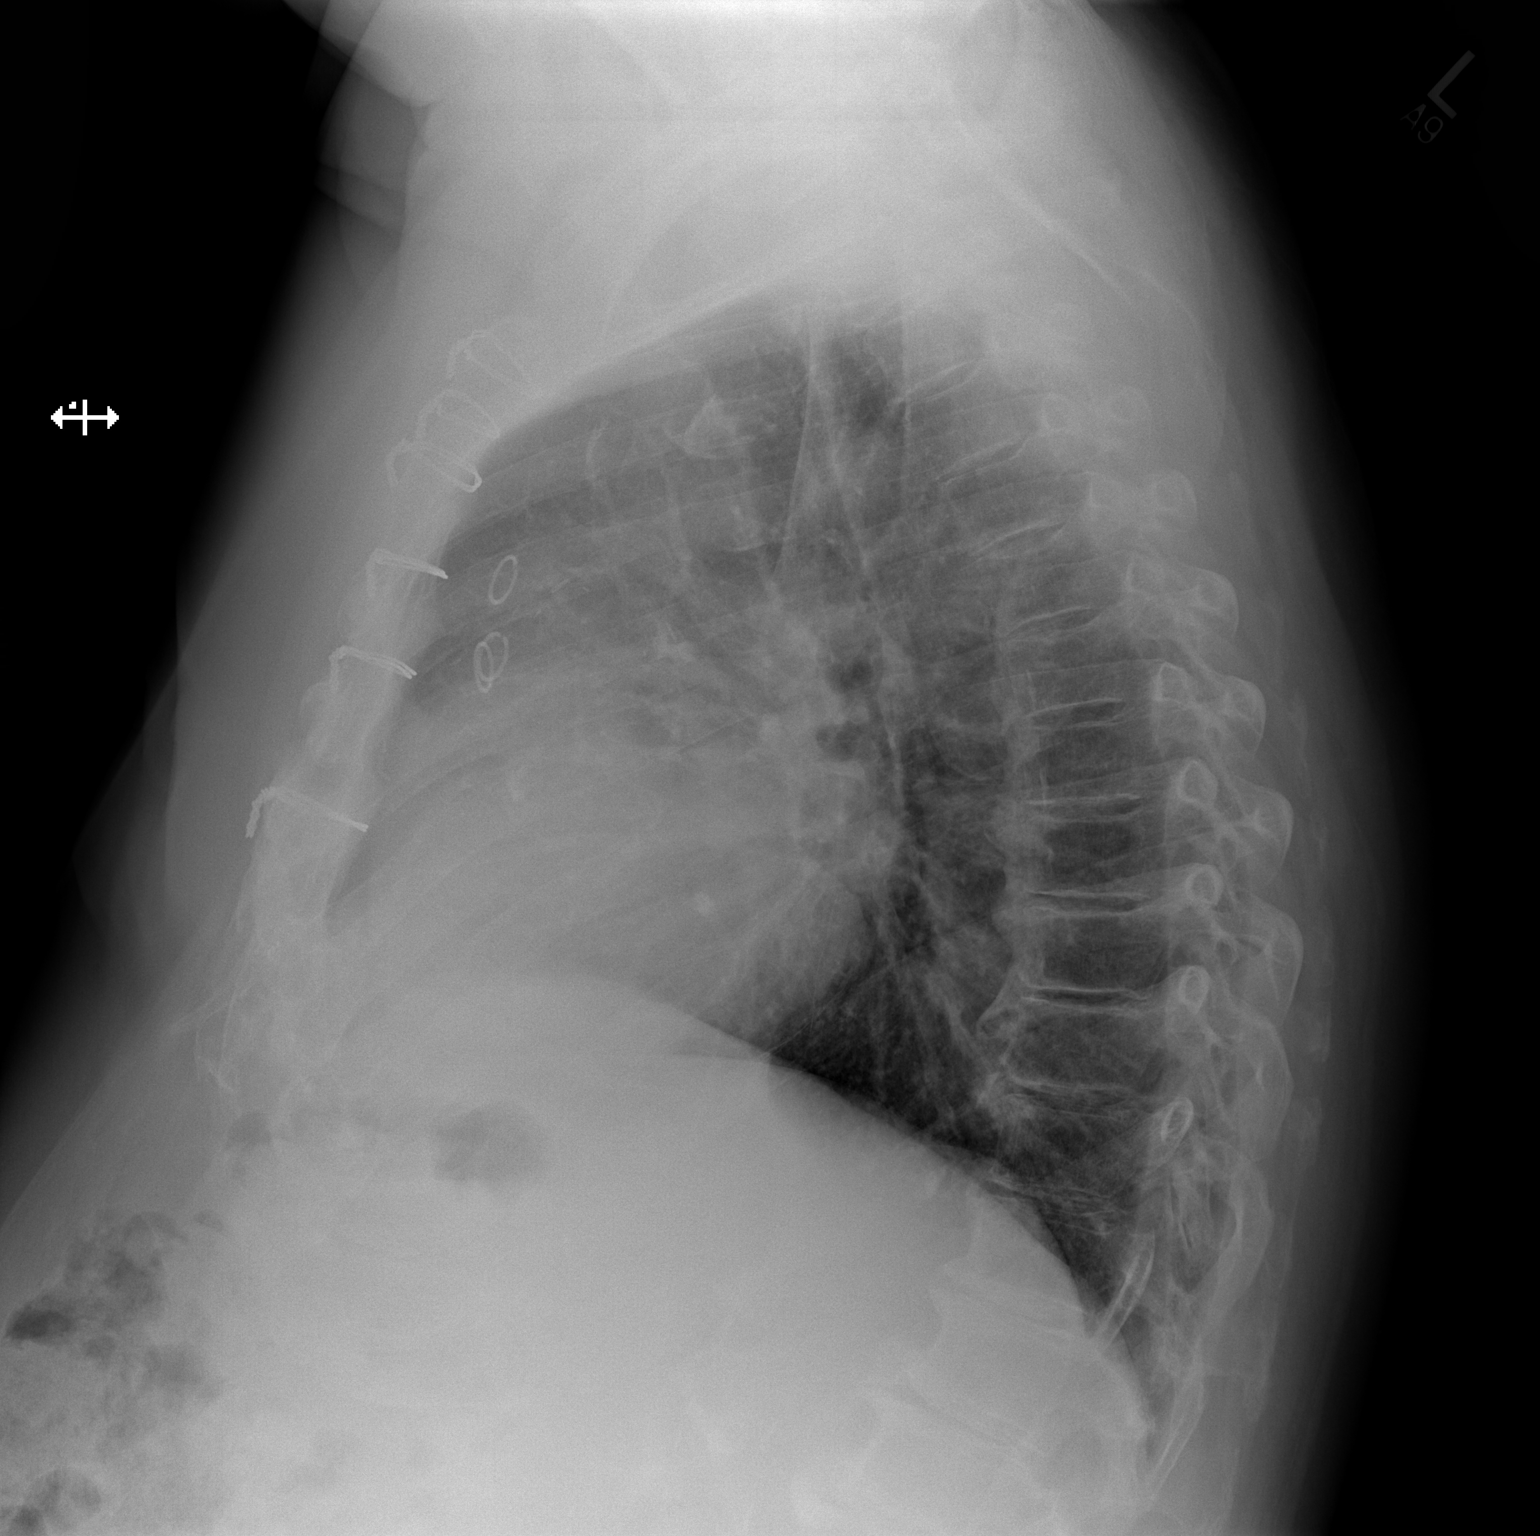

[2 of 2 positions shown; findings below may reference images not displayed]

FINDINGS: The lungs are adequately inflated and clear. There is no pleural
effusion. The cardiac silhouette is enlarged but stable. The
pulmonary vascularity is less prominent likely due to the PA
technique. The patient has undergone previous CABG. There is
calcification in the wall of the aortic arch. There is multilevel
degenerative disc disease of the thoracic spine.
IMPRESSION: Cardiomegaly without evidence of pulmonary vascular congestion or
pulmonary edema. Previous CABG. No acute cardiopulmonary
abnormality.

Thoracic aortic atherosclerosis.

## 2017-08-12 NOTE — Patient Instructions (Addendum)
Medication Instructions: Your physician recommends that you continue on your current medications as directed. Please refer to the Current Medication list given to you today.  Labwork: None Ordered  Procedures/Testing: None Ordered  Follow-Up: Your physician wants you to follow-up in: 9 MONTHS with Dr. [name]. You will receive a reminder letter in the mail two months in advance. If you don't receive a letter, please call our office to schedule the follow-up appointment.  Remote monitoring is used to monitor yourICD from home. This monitoring reduces the number of office visits required to check your device to one time per year. It allows [place] to keep an eye on the functioning of your device to ensure it is working properly. You are scheduled for a device check from home on 01/06/18. You may send your transmission at any time that day. If you have a wireless device, the transmission will be sent automatically. After your physician reviews your transmission, you will receive a postcard with your next transmission date.   If you need a refill on your cardiac medications before your next appointment, please call your pharmacy.

## 2017-08-12 NOTE — Progress Notes (Signed)
He is not having infrequent     Patient Care Team: Laurey Morale, MD as PCP - General Festus Aloe, MD as Consulting Physician (Urology)   HPI  Justin [name] is a 83 y.o. male Seen with a chief complaint of an ICD implanted because of syncope  and new left bundle branch block following TAVR    No interval syncope   Around the world cruise is upcoming    Records and Results Reviewed  Past Medical History:  Diagnosis Date  . Age-related macular degeneration, dry, both eyes   . Allergic    "24/7; 365 days/year; I'm allergic to pollens, dust, all southern grasses/trees, mold, mildue, cats, dogs" (01/07/2017)  . Asthma    sees [name]   . Benign prostatic hypertrophy    (sees Dr. Denman George  . CAD (coronary artery disease)    a. s/p CABG 2007. b. Cath 08/2016 - 4/4 patent grafts.  . Carotid bruit    carotid u/s 10/10: 0.39% bilaterally  . Chronic combined systolic and diastolic CHF (congestive heart failure) ([place])   . Complication of anesthesia 1980s   "w/anal cyst OR, he gave me a saddle block then put a narcotic in spinal cord; had a severe reaction to that" (01/07/2017)  . ED (erectile dysfunction)   . Family history of adverse reaction to anesthesia    "daughter wakes up during OR" (01/07/2017)  . GERD (gastroesophageal reflux disease)   . Gout   . HTN (hypertension)   . Hx of colonic polyps    (sees Dr. Henrene Pastor)  . Hyperlipidemia   . Hypothyroidism   . Moderate to severe aortic stenosis    a. s/p TAVR 02/2017.  [name] Myocardial infarction ([place]) ~ 2000  . Obesity   . Osteoarthritis    "was in my knees, hands" (01/07/2017 )  . PAF (paroxysmal atrial fibrillation) ([place])    a. documented post TAVR.  [name] Precancerous skin lesion    (sees Dr. Allyson Sabal)  . Prostate cancer ([place]) dx'd ~ 2014  . 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 ANUS SURGERY     "opened it back up cause it wouldn't heal; wound up w/a fissure" (01/07/2017)  . BIV ICD INSERTION CRT-D N/A 05/02/2017   Procedure: BIV ICD INSERTION CRT-D;  Surgeon: Deboraha Sprang, MD;  Location: [facility];  Service: Cardiovascular;  Laterality: N/A;  . CARDIAC CATHETERIZATION  10/29/2005  . CARDIAC CATHETERIZATION N/A 08/27/2016   Procedure: Right/Left Heart Cath and Coronary/Graft Angiography;  Surgeon: Burnell Blanks, MD;  Location: [facility];  Service: Cardiovascular;  Laterality: N/A;  . CATARACT EXTRACTION W/ INTRAOCULAR LENS  IMPLANT, BILATERAL Bilateral   . CATARACT EXTRACTION, BILATERAL  2012  . COLONOSCOPY  06/30/2008   no repeats needed   . CORONARY ARTERY BYPASS GRAFT  2007   "CABG X4"  . CYST EXCISION PERINEAL  1980s  . HAMMER TOE SURGERY Bilateral   . JOINT REPLACEMENT    . KNEE ARTHROPLASTY  07/30/2011   Procedure: COMPUTER ASSISTED TOTAL KNEE ARTHROPLASTY;  Surgeon: Meredith Pel;  Location: [facility];  Service: Orthopedics;  Laterality: Left;  left total knee arthroplasty  . MASTECTOMY SUBCUTANEOUS Bilateral   . ORIF FINGER / THUMB FRACTURE Right ~ 1980   "repair of thumb  injury"  . PROSTATE BIOPSY    . REPLACEMENT TOTAL KNEE BILATERAL Bilateral 2012  . TEE WITHOUT CARDIOVERSION N/A 03/04/2017   Procedure: TRANSESOPHAGEAL ECHOCARDIOGRAM (TEE);  Surgeon: Burnell Blanks, MD;  Location: [facility];  Service: Open Heart Surgery;  Laterality: N/A;  . TRANSCATHETER AORTIC VALVE REPLACEMENT, TRANSFEMORAL N/A 03/04/2017   Procedure: TRANSCATHETER AORTIC VALVE REPLACEMENT, TRANSFEMORAL;  Surgeon: Burnell Blanks, MD;  Location: [facility];  Service: Open Heart Surgery;  Laterality: N/A;    Current Outpatient Medications  Medication Sig Dispense Refill  . aspirin 81 MG tablet Take 1 tablet (81 mg total) by mouth daily. 30 tablet 0  . atorvastatin  (LIPITOR) 20 MG tablet TAKE 1 TABLET ONCE DAILY. (Patient taking differently: TAKE 1 TABLET ONCE DAILY IN THE EVENING) 90 tablet 3  . Azelastine-Fluticasone 137-50 MCG/ACT SUSP Place 2 sprays into both nostrils 2 (two) times daily. 2 Bottle 0  . azithromycin (ZITHROMAX) 250 MG tablet Take 1 tablet (250 mg total) by mouth as directed. 6 tablet 1  . benzonatate (TESSALON) 200 MG capsule TAKE 1 CAPSULE TWICE DAILY AS NEEDED FOR COUGH. 60 capsule 0  . budesonide-formoterol (SYMBICORT) 80-4.5 MCG/ACT inhaler Inhale 2 puffs into the lungs 2 (two) times daily. 1 Inhaler 12  . chlorpheniramine (CHLOR-TRIMETON) 4 MG tablet Take 8 mg by mouth 2 (two) times daily.     . dabigatran (PRADAXA) 150 MG CAPS capsule Take 1 capsule (150 mg total) by mouth 2 (two) times daily. 180 capsule 3  . DYMISTA 137-50 MCG/ACT SUSP USE 2 SPRAYS EACH NOSTRIL TWICE DAILY. 23 g 11  . furosemide (LASIX) 80 MG tablet Take 80 mg daily alternating with 80 mg twice daily. 135 tablet 3  . ipratropium (ATROVENT) 0.03 % nasal spray Place 2 sprays into both nostrils every 6 (six) hours as needed for rhinitis. 30 mL 4  . isosorbide mononitrate (IMDUR) 30 MG 24 hr tablet Take 1 tablet (30 mg total) by mouth daily. 180 tablet 3  . isosorbide mononitrate (IMDUR) 30 MG 24 hr tablet TAKE 1 TABLET ONCE DAILY. 90 tablet 0  . Ketotifen Fumarate (ZADITOR OP) Apply 1 drop to eye daily as needed (dry eyes).    [name] levothyroxine (SYNTHROID, LEVOTHROID) 75 MCG tablet Take 1 tablet (75 mcg total) by mouth daily. 180 tablet 3  . losartan (COZAAR) 25 MG tablet Take 1 tablet (25 mg total) by mouth daily. 90 tablet 3  . metoprolol tartrate (LOPRESSOR) 25 MG tablet Take 1 tablet (25 mg total) by mouth 2 (two) times daily. 180 tablet 3  . montelukast (SINGULAIR) 10 MG tablet Take 1 tablet (10 mg total) by mouth every evening. 90 tablet 4  . Multiple Vitamins-Minerals (PRESERVISION AREDS PO) Take 2 tablets by mouth every morning.    [name] omeprazole (PRILOSEC) 20 MG  capsule Take 1 capsule (20 mg total) by mouth daily. 90 capsule 4  . potassium chloride SA (K-DUR,KLOR-CON) 20 MEQ tablet Take 2 tablets by mouth twice daily alternating with 2 tablets daily. 270 tablet 3  . ranitidine (ZANTAC) 150 MG capsule Take 150 mg by mouth every evening.     . triamcinolone cream (KENALOG) 0.1 % Apply 1 application topically daily as needed for dry skin.     No current facility-administered medications for this visit.     Allergies  Allergen Reactions  . Peanut-Containing Drug Products Anaphylaxis  . Sulfonamide Derivatives Anaphylaxis  . Amlodipine Swelling    Swelling in ankles  . Eliquis [Apixaban] Other (See Comments)  Back/hip pain  . Lisinopril Cough    cough  . Xarelto [Rivaroxaban] Other (See Comments)    Back/hip pain      Review of Systems negative except from HPI and PMH  Physical Exam BP (!) 125/51   Pulse 68   Ht 5\' 7"  (1.702 m)   Wt 265 lb (120.2 kg)   SpO2 95%   BMI 41.50 kg/m  Well developed and nourished in no acute distress HENT normal Neck supple with JVP-flat Clear Device pocket well healed; without hematoma or erythema.  There is no tethering Regular rate and rhythm, no murmurs or gallops Abd-soft with active BS No Clubbing cyanosis edema Skin-warm and dry A & Oriented  Grossly normal sensory and motor function   ECG to assess biventricular pacing function demonstrates P synchronous pacing with an upright QRS in lead V1 negative QRS in lead I infrequent ectopic beats with a right bundle branch block morphology  Assessment and  Plan  Ventricular tachycardia- prolonged- nonsustained  Sinus node dysfunction  Junctional rhythm  Post TAVR left bundle branch block/IVCD  Cardiomyopathy EF 40-45%  CRT- D [employer]  PVCs-frequent    The patient is having frequent PVCs.  It may be a contributor to his cardiomyopathy.  As he is getting ready to go around the world, we will not invoke any medical changes at this time.    He is having infrequent VTNS  Euvolemic continue current meds   No interval syncope.  Device function is normal     Current medicines are reviewed at length with the patient today .  The patient does not  have concerns regarding medicines.

## 2017-08-13 ENCOUNTER — Encounter (HOSPITAL_COMMUNITY)
Admission: RE | Admit: 2017-08-13 | Discharge: 2017-08-13 | Disposition: A | Discharge status: Home / Self Care | Payer: Medicare Other | Source: Ambulatory Visit | Attending: Cardiovascular Disease | Admitting: Cardiovascular Disease

## 2017-08-13 VITALS — Ht 67.75 in | Wt 265.9 lb

## 2017-08-13 DIAGNOSIS — Z952 Presence of prosthetic heart valve: Secondary | ICD-10-CM

## 2017-08-13 DIAGNOSIS — Z48812 Encounter for surgical aftercare following surgery on the circulatory system: Secondary | ICD-10-CM | POA: Diagnosis not present

## 2017-08-13 LAB — CUP PACEART INCLINIC DEVICE CHECK
Brady Statistic AP VP Percent: 1.6 %
Brady Statistic AS VP Percent: 85.9 %
Brady Statistic AS VS Percent: 12.4 %
Date Time Interrogation Session: 20181219104325
HIGH POWER IMPEDANCE MEASURED VALUE: 69 Ohm
Implantable Lead Implant Date: 20180907
Implantable Lead Location: 753859
Lead Channel Impedance Value: 437 Ohm
Lead Channel Impedance Value: 456 Ohm
Lead Channel Pacing Threshold Amplitude: 0.75 V
Lead Channel Pacing Threshold Pulse Width: 0.4 ms
Lead Channel Sensing Intrinsic Amplitude: 13 mV
Lead Channel Setting Pacing Amplitude: 3.5 V
Lead Channel Setting Pacing Pulse Width: 0.4 ms
Lead Channel Setting Sensing Sensitivity: 0.3 mV
MDC IDC LEAD IMPLANT DT: 20180907
MDC IDC LEAD IMPLANT DT: 20180907
MDC IDC LEAD LOCATION: 753858
MDC IDC LEAD LOCATION: 753860
MDC IDC MSMT BATTERY REMAINING LONGEVITY: 78 mo
MDC IDC MSMT LEADCHNL LV PACING THRESHOLD AMPLITUDE: 1 V
MDC IDC MSMT LEADCHNL LV PACING THRESHOLD PULSEWIDTH: 1 ms
MDC IDC MSMT LEADCHNL RA IMPEDANCE VALUE: 380 Ohm
MDC IDC MSMT LEADCHNL RA PACING THRESHOLD AMPLITUDE: 0.75 V
MDC IDC MSMT LEADCHNL RA SENSING INTR AMPL: 2.1 mV
MDC IDC MSMT LEADCHNL RV PACING THRESHOLD PULSEWIDTH: 0.4 ms
MDC IDC PG IMPLANT DT: 20180907
MDC IDC SET LEADCHNL LV PACING AMPLITUDE: 3.5 V
MDC IDC SET LEADCHNL LV PACING PULSEWIDTH: 1 ms
MDC IDC SET LEADCHNL RA PACING AMPLITUDE: 3.5 V
MDC IDC STAT BRADY AP VS PERCENT: 0.1 %

## 2017-08-15 ENCOUNTER — Encounter (HOSPITAL_COMMUNITY)
Admission: RE | Admit: 2017-08-15 | Discharge: 2017-08-15 | Disposition: A | Discharge status: Home / Self Care | Payer: Medicare Other | Source: Ambulatory Visit | Attending: Cardiovascular Disease | Admitting: Cardiovascular Disease

## 2017-08-15 DIAGNOSIS — Z48812 Encounter for surgical aftercare following surgery on the circulatory system: Secondary | ICD-10-CM | POA: Diagnosis not present

## 2017-08-15 DIAGNOSIS — Z952 Presence of prosthetic heart valve: Secondary | ICD-10-CM | POA: Diagnosis not present

## 2017-08-17 IMAGING — CR DG CHEST 1V PORT
1 series · 1 of 1 positions shown · non-contrast
Comparison: 02/27/2017

CLINICAL DATA: Aortic stenosis. Status post transcatheter aortic
valve replacement (TAVR).

EXAM:
PORTABLE CHEST 1 VIEW

[AP]
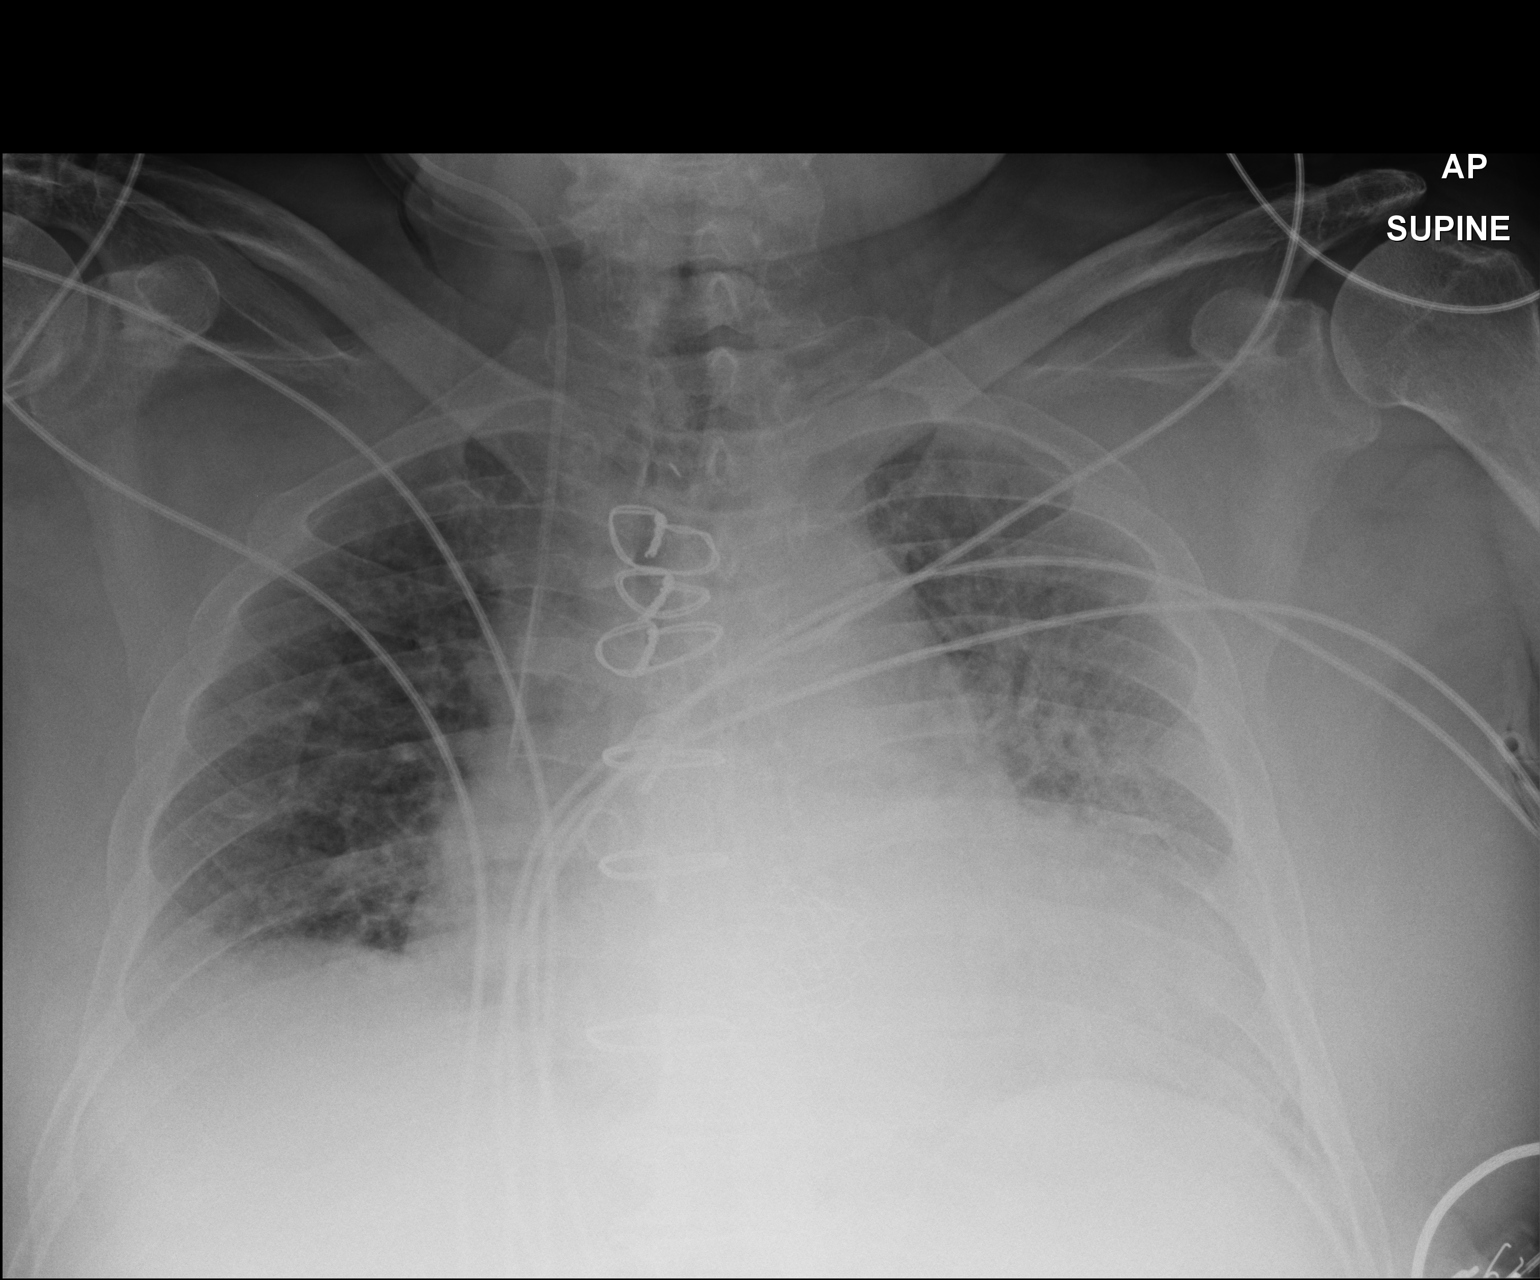

[1 of 1 positions shown; findings below may reference images not displayed]

FINDINGS: Central venous catheter tip appears in good position in the superior
vena cava. No pneumothorax. Prosthetic aortic valve in place. CABG.
Slight pulmonary vascular congestion, felt to be due to the shallow
inspiration. No atelectasis or effusions.
IMPRESSION: Mild vascular prominence felt to be due to a shallow inspiration.

## 2017-08-18 ENCOUNTER — Encounter (HOSPITAL_COMMUNITY): Payer: Medicare Other

## 2017-08-18 IMAGING — CR DG CHEST 1V PORT
1 series · 1 of 1 positions shown · non-contrast
Comparison: Radiograph March 04, 2017.

CLINICAL DATA: Status post aortic valve repair.

EXAM:
PORTABLE CHEST 1 VIEW

[AP]
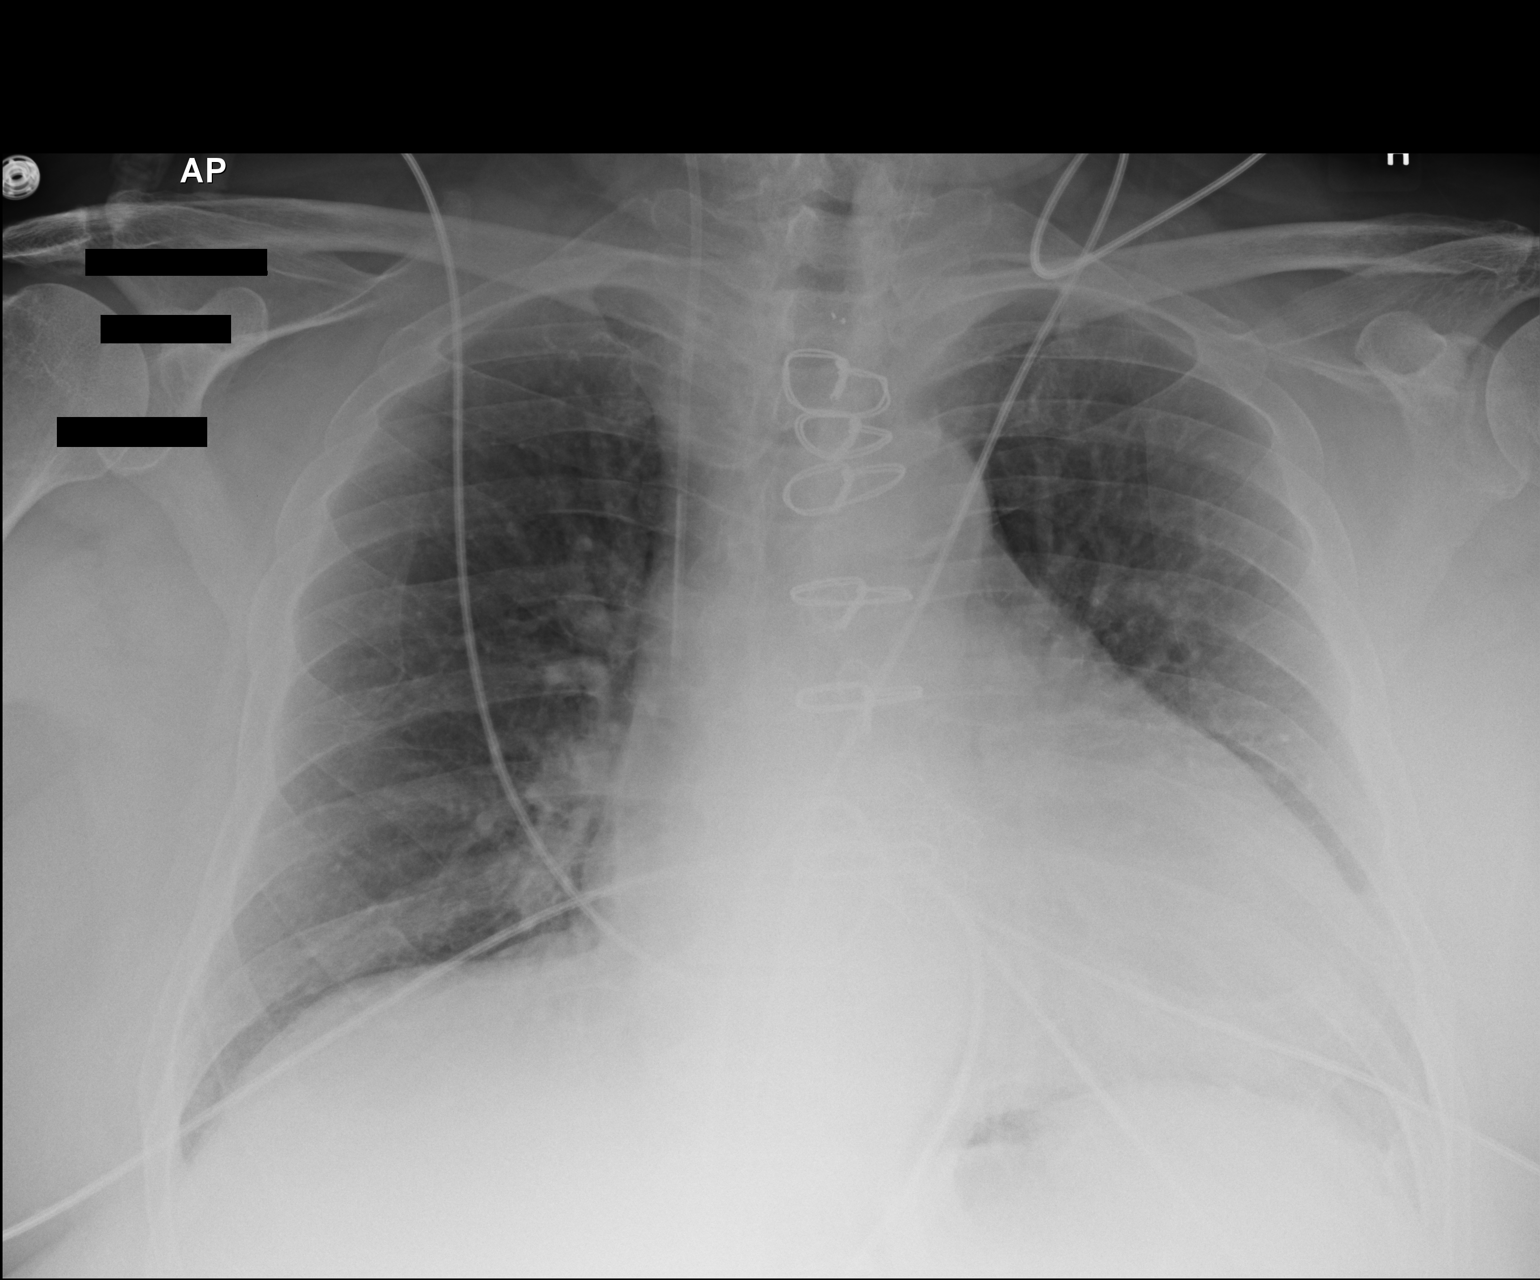

[1 of 1 positions shown; findings below may reference images not displayed]

FINDINGS: Stable cardiomegaly. Aortic valve prosthesis is noted. Right
internal jugular catheter is unchanged in position. No pneumothorax
or pleural effusion is noted. No acute pulmonary disease is noted.
Bony thorax is unremarkable.
IMPRESSION: No acute cardiopulmonary abnormality seen.

## 2017-08-20 ENCOUNTER — Encounter (HOSPITAL_COMMUNITY)
Admission: RE | Admit: 2017-08-20 | Discharge: 2017-08-20 | Disposition: A | Discharge status: Home / Self Care | Payer: Medicare Other | Source: Ambulatory Visit | Attending: Cardiovascular Disease | Admitting: Cardiovascular Disease

## 2017-08-20 DIAGNOSIS — Z48812 Encounter for surgical aftercare following surgery on the circulatory system: Secondary | ICD-10-CM | POA: Diagnosis not present

## 2017-08-20 DIAGNOSIS — Z952 Presence of prosthetic heart valve: Secondary | ICD-10-CM | POA: Diagnosis not present

## 2017-08-22 ENCOUNTER — Encounter (HOSPITAL_COMMUNITY): Payer: Self-pay

## 2017-08-22 ENCOUNTER — Encounter (HOSPITAL_COMMUNITY)
Admission: RE | Admit: 2017-08-22 | Discharge: 2017-08-22 | Disposition: A | Discharge status: Home / Self Care | Payer: Medicare Other | Source: Ambulatory Visit | Attending: Cardiovascular Disease | Admitting: Cardiovascular Disease

## 2017-08-22 DIAGNOSIS — Z952 Presence of prosthetic heart valve: Secondary | ICD-10-CM | POA: Diagnosis not present

## 2017-08-22 DIAGNOSIS — Z48812 Encounter for surgical aftercare following surgery on the circulatory system: Secondary | ICD-10-CM | POA: Diagnosis not present

## 2017-08-22 NOTE — Progress Notes (Signed)
Cardiac Individual Treatment Plan  Patient Details  Name: Justin [name] MRN: [id number] Date of Birth: 06/25/1934 Referring Provider:     CARDIAC REHAB PHASE II ORIENTATION from 05/29/2017 in [place]  Referring Provider  Darlina Guys      Initial Encounter Date:    CARDIAC REHAB PHASE II ORIENTATION from 05/29/2017 in [place]  Date  05/29/17  Referring Provider  Darlina Guys      Visit Diagnosis: S/P TAVR (transcatheter aortic valve replacement)  Patient's Home Medications on Admission:  Current Outpatient Medications:  .  aspirin 81 MG tablet, Take 1 tablet (81 mg total) by mouth daily., Disp: 30 tablet, Rfl: 0 .  atorvastatin (LIPITOR) 20 MG tablet, TAKE 1 TABLET ONCE DAILY. (Patient taking differently: TAKE 1 TABLET ONCE DAILY IN THE EVENING), Disp: 90 tablet, Rfl: 3 .  Azelastine-Fluticasone 137-50 MCG/ACT SUSP, Place 2 sprays into both nostrils 2 (two) times daily., Disp: 2 Bottle, Rfl: 0 .  azithromycin (ZITHROMAX) 250 MG tablet, Take 1 tablet (250 mg total) by mouth as directed., Disp: 6 tablet, Rfl: 1 .  benzonatate (TESSALON) 200 MG capsule, TAKE 1 CAPSULE TWICE DAILY AS NEEDED FOR COUGH., Disp: 60 capsule, Rfl: 0 .  budesonide-formoterol (SYMBICORT) 80-4.5 MCG/ACT inhaler, Inhale 2 puffs into the lungs 2 (two) times daily., Disp: 1 Inhaler, Rfl: 12 .  chlorpheniramine (CHLOR-TRIMETON) 4 MG tablet, Take 8 mg by mouth 2 (two) times daily. , Disp: , Rfl:  .  dabigatran (PRADAXA) 150 MG CAPS capsule, Take 1 capsule (150 mg total) by mouth 2 (two) times daily., Disp: 180 capsule, Rfl: 3 .  DYMISTA 137-50 MCG/ACT SUSP, USE 2 SPRAYS EACH NOSTRIL TWICE DAILY., Disp: 23 g, Rfl: 11 .  furosemide (LASIX) 80 MG tablet, Take 80 mg daily alternating with 80 mg twice daily., Disp: 135 tablet, Rfl: 3 .  ipratropium (ATROVENT) 0.03 % nasal spray, Place 2 sprays into both nostrils every 6 (six) hours as needed for  rhinitis., Disp: 30 mL, Rfl: 4 .  isosorbide mononitrate (IMDUR) 30 MG 24 hr tablet, Take 1 tablet (30 mg total) by mouth daily., Disp: 180 tablet, Rfl: 3 .  isosorbide mononitrate (IMDUR) 30 MG 24 hr tablet, TAKE 1 TABLET ONCE DAILY., Disp: 90 tablet, Rfl: 0 .  Ketotifen Fumarate (ZADITOR OP), Apply 1 drop to eye daily as needed (dry eyes)., Disp: , Rfl:  .  levothyroxine (SYNTHROID, LEVOTHROID) 75 MCG tablet, Take 1 tablet (75 mcg total) by mouth daily., Disp: 180 tablet, Rfl: 3 .  losartan (COZAAR) 25 MG tablet, Take 1 tablet (25 mg total) by mouth daily., Disp: 90 tablet, Rfl: 3 .  metoprolol tartrate (LOPRESSOR) 25 MG tablet, Take 1 tablet (25 mg total) by mouth 2 (two) times daily., Disp: 180 tablet, Rfl: 3 .  montelukast (SINGULAIR) 10 MG tablet, Take 1 tablet (10 mg total) by mouth every evening., Disp: 90 tablet, Rfl: 4 .  Multiple Vitamins-Minerals (PRESERVISION AREDS PO), Take 2 tablets by mouth every morning., Disp: , Rfl:  .  omeprazole (PRILOSEC) 20 MG capsule, Take 1 capsule (20 mg total) by mouth daily., Disp: 90 capsule, Rfl: 4 .  potassium chloride SA (K-DUR,KLOR-CON) 20 MEQ tablet, Take 2 tablets by mouth twice daily alternating with 2 tablets daily., Disp: 270 tablet, Rfl: 3 .  ranitidine (ZANTAC) 150 MG capsule, Take 150 mg by mouth every evening. , Disp: , Rfl:  .  triamcinolone cream (KENALOG) 0.1 %, Apply 1 application topically  daily as needed for dry skin., Disp: , Rfl:   Past Medical History: Past Medical History:  Diagnosis Date  . Age-related macular degeneration, dry, both eyes   . Allergic    "24/7; 365 days/year; I'm allergic to pollens, dust, all southern grasses/trees, mold, mildue, cats, dogs" (01/07/2017)  . Asthma    sees [name]   . Benign prostatic hypertrophy    (sees Dr. Denman George  . CAD (coronary artery disease)    a. s/p CABG 2007. b. Cath 08/2016 - 4/4 patent grafts.  . Carotid bruit    carotid u/s 10/10: 0.39% bilaterally  . Chronic combined  systolic and diastolic CHF (congestive heart failure) ([place])   . Complication of anesthesia 1980s   "w/anal cyst OR, he gave me a saddle block then put a narcotic in spinal cord; had a severe reaction to that" (01/07/2017)  . ED (erectile dysfunction)   . Family history of adverse reaction to anesthesia    "daughter wakes up during OR" (01/07/2017)  . GERD (gastroesophageal reflux disease)   . Gout   . HTN (hypertension)   . Hx of colonic polyps    (sees Dr. Henrene Pastor)  . Hyperlipidemia   . Hypothyroidism   . Moderate to severe aortic stenosis    a. s/p TAVR 02/2017.  [name] Myocardial infarction ([place]) ~ 2000  . Obesity   . Osteoarthritis    "was in my knees, hands" (01/07/2017 )  . PAF (paroxysmal atrial fibrillation) ([place])    a. documented post TAVR.  [name] Precancerous skin lesion    (sees Dr. Allyson Sabal)  . Prostate cancer ([place]) dx'd ~ 2014  . 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 Packs/day: 3.50  . Years: 13.00  . Pack years: 45.50  . Types: Cigarettes  . Last attempt to quit: 1963  . Years since quitting: 56.0  Smokeless Tobacco Never Used    Labs: Recent Chemical engineer    Labs for ITP Cardiac and Pulmonary Rehab Latest Ref Rng & Units 08/27/2016 09/25/2016 02/27/2017 03/04/2017 03/04/2017   Cholestrol 0 - 200 mg/dL - 109 - - -   LDLCALC 0 - 99 mg/dL - 53 - - -   HDL >39.00 mg/dL - 29.80(L) - - -   Trlycerides 0.0 - 149.0 mg/dL - 133.0 - - -   Hemoglobin A1c 4.8 - 5.6 % - - 6.5(H) - -   PHART 7.350 - 7.450 7.437 - 7.475(H) - -   PCO2ART 32.0 - 48.0 mmHg 39.2 - 36.4 - -   HCO3 20.0 - 28.0 mmol/L 26.5 - 26.5 - -   TCO2 0 - 100 mmol/L 28 - - 28 28   O2SAT % 94.0 - 95.0 - -      Capillary Blood Glucose: No results found for: GLUCAP   Exercise Target  Goals:    Exercise Program Goal: Individual exercise prescription set with THRR, safety & activity barriers. Participant demonstrates ability to understand and report RPE using BORG scale, to self-measure pulse accurately, and to acknowledge the importance of the exercise prescription.  Exercise Prescription Goal: Starting with aerobic activity 30 plus minutes a day, 3 days per week for initial exercise prescription. Provide home exercise prescription and guidelines that participant acknowledges understanding prior to discharge.  Activity Barriers & Risk Stratification: Activity Barriers & Cardiac Risk Stratification - 05/29/17 0819      Activity Barriers & Cardiac Risk Stratification   Activity Barriers  Joint Problems;Deconditioning;Muscular Weakness;Incisional Pain;Left Knee Replacement;Right Knee Replacement;Other (comment)    Comments  toes straighten, R thumb surgery    Cardiac Risk Stratification  High       6 Minute Walk: 6 Minute Walk    Row Name 05/29/17 0820 05/29/17 1015 05/29/17 1033     6 Minute Walk   Phase  Initial  -  -   Distance  1372 feet  -  -   Distance Feet Change  -  1372 ft  -   Walk Time  6 minutes  -  -   # of Rest Breaks  0  -  -   MPH  -  2.6  -   METS  -  1.87  -   RPE  11  -  -   VO2 Peak  -  1372  -   Symptoms  -  Yes (comment)  -   Comments  -  brief chest pressure noted at the end. Resolved at end of walk test  -   Resting HR  89 bpm  -  -   Resting BP  124/60  -  -   Resting Oxygen Saturation   96 %  -  -   Exercise Oxygen Saturation  during 6 min walk  96 %  -  -   Max Ex. HR  111 bpm  -  -   Max Ex. BP  134/64  -  -   2 Minute Post BP  -  -  122/70   Row Name 08/13/17 1118 08/13/17 1121       6 Minute Walk   Phase  Discharge  -    Distance  1531 feet  -    Distance % Change  -  11.59 %    Distance Feet Change  -  159 ft    Walk Time  6 minutes  -    # of Rest Breaks  0  -    MPH  2.9  -    METS  2.1  -    RPE  13  -    VO2  Peak  7.35  -    Symptoms  No  -    Resting HR  81 bpm  -    Resting BP  126/60  -    Max Ex. HR  106 bpm  -    Max Ex. BP  138/62  -    2 Minute Post BP  94/64 recheck after water, 112/64  -       Oxygen Initial Assessment:   Oxygen Re-Evaluation:   Oxygen Discharge (Final Oxygen Re-Evaluation):   Initial Exercise Prescription: Initial Exercise Prescription - 05/29/17 1000      Date of Initial Exercise RX and Referring Provider   Date  05/29/17    Referring Provider  Darlina Guys      Treadmill   MPH  1.8    Grade  0    Minutes  10    METs  2.38      Bike   Level  --    Minutes  --    METs  --      Recumbant Bike   Level  1.5    Minutes  10    METs  1.8      NuStep   Level  2    SPM  70    Minutes  10    METs  2      Prescription Details   Frequency (times per week)  3    Duration  Progress to 30 minutes of continuous aerobic without signs/symptoms of physical distress      Intensity   THRR 40-80% of Max Heartrate  56-112    Ratings of Perceived Exertion  11-13    Perceived Dyspnea  0-4      Progression   Progression  Continue to progress workloads to maintain intensity without signs/symptoms of physical distress.      Resistance Training   Training Prescription  No recent IDC placement on 05/02/17       Perform Capillary Blood Glucose checks as needed.  Exercise Prescription Changes: Exercise Prescription Changes    Row Name 06/03/17 1400 06/23/17 1443 07/07/17 1356 07/22/17 1400 08/01/17 1712     Response to Exercise   Blood Pressure (Admit)  106/60  114/64  124/62  102/72  118/66   Blood Pressure (Exercise)  124/70  110/72  128/70  140/60  136/80   Blood Pressure (Exit)  104/62  108/70  98/58  108/60  104/60   Heart Rate (Admit)  97 bpm  74 bpm  82 bpm  86 bpm  82 bpm   Heart Rate (Exercise)  108 bpm  93 bpm  99 bpm  104 bpm  100 bpm   Heart Rate (Exit)  97 bpm  74 bpm  61 bpm  78 bpm  79 bpm   Rating of Perceived Exertion (Exercise)   11  11  11  11  11    Symptoms  none  none  none  none  none   Comments  pt was oriented to exercise equipment on 06/02/17  pt was oriented to exercise equipment on 06/02/17  -  -  -   Duration  Continue with 45 min of aerobic exercise without signs/symptoms of physical distress.  Continue with 45 min of aerobic exercise without signs/symptoms of physical distress.  Continue with 45 min of aerobic exercise without signs/symptoms of physical distress.  Continue with 45 min of aerobic exercise without signs/symptoms of physical distress.  Continue with 45 min of aerobic exercise without signs/symptoms of physical distress.   Intensity  THRR unchanged  THRR unchanged  THRR unchanged  THRR unchanged  THRR unchanged     Progression   Progression  Continue to progress workloads to maintain intensity without signs/symptoms of physical distress.  Continue to progress workloads to maintain intensity without signs/symptoms of physical distress.  Continue to progress workloads to maintain intensity without signs/symptoms of physical distress.  Continue to progress workloads to maintain intensity without signs/symptoms of physical distress.  Continue to progress workloads to maintain intensity without signs/symptoms of physical distress.   Average METs  2.3  2.7  3.4  2.6  2.7     Treadmill   MPH  1.8  2  2  2   2.2   Grade  0  1  1  1  1    Minutes  10  10  10  10  10    METs  2.38  2.81  2.81  2.81  2.99     Recumbant Bike   Level  1.5  3  3  3  3    Minutes  10  10  10  10   10  METs  1.9  2.7  3.4  2.4  2.5     NuStep   Level  2  4  4  4  4    SPM  80  80  100  100  100   Minutes  10  10  10  10  10    METs  2.6  2.7  4.1  2.5  2.7     Home Exercise Plan   Plans to continue exercise at  -  Home (comment)  Home (comment)  Home (comment)  Home (comment)   Frequency  -  Add 2 additional days to program exercise sessions.  Add 2 additional days to program exercise sessions.  Add 2 additional days to program  exercise sessions.  Add 2 additional days to program exercise sessions.   Initial Home Exercises Provided  -  06/09/17  06/09/17  06/09/17  06/09/17   Row Name 08/11/17 1700             Response to Exercise   Blood Pressure (Admit)  100/70       Blood Pressure (Exercise)  120/70       Blood Pressure (Exit)  110/60       Heart Rate (Admit)  85 bpm       Heart Rate (Exercise)  101 bpm       Heart Rate (Exit)  72 bpm       Rating of Perceived Exertion (Exercise)  11       Symptoms  none       Duration  Continue with 45 min of aerobic exercise without signs/symptoms of physical distress.       Intensity  THRR unchanged         Progression   Progression  Continue to progress workloads to maintain intensity without signs/symptoms of physical distress.       Average METs  2.7         Treadmill   MPH  2.2       Grade  1       Minutes  10       METs  2.99         Recumbant Bike   Level  3.5       Minutes  10       METs  2.4         NuStep   Level  4       SPM  100       Minutes  10       METs  2.7         Home Exercise Plan   Plans to continue exercise at  Home (comment)       Frequency  Add 2 additional days to program exercise sessions.       Initial Home Exercises Provided  06/09/17          Exercise Comments: Exercise Comments    Row Name 06/02/17 1423 06/24/17 1447 07/22/17 1433 08/06/17 1053     Exercise Comments  Pt was oriented to exercise equipment today. Pt responded well to first exercise session and denied SOB, chesp pain and dizziness with exercise today; will continue to monitor.  Reviewed METs and goals. Pt is tolerating exercise well; will continue to monitor pt's progress and activity levels.   Reviewed METs and goals. Pt is tolerating exercise well; will continue to monitor pt's progress and activity levels.   Reviewed METs and goals. Pt is tolerating  exercise well; will continue to monitor pt's progress and activity levels.        Exercise Goals and  Review: Exercise Goals    Row Name 05/29/17 0820             Exercise Goals   Increase Physical Activity  Yes       Intervention  Provide advice, education, support and counseling about physical activity/exercise needs.;Develop an individualized exercise prescription for aerobic and resistive training based on initial evaluation findings, risk stratification, comorbidities and participant's personal goals.       Expected Outcomes  Achievement of increased cardiorespiratory fitness and enhanced flexibility, muscular endurance and strength shown through measurements of functional capacity and personal statement of participant.       Increase Strength and Stamina  Yes learn activity/exercise limitations       Intervention  Provide advice, education, support and counseling about physical activity/exercise needs.;Develop an individualized exercise prescription for aerobic and resistive training based on initial evaluation findings, risk stratification, comorbidities and participant's personal goals.       Expected Outcomes  Achievement of increased cardiorespiratory fitness and enhanced flexibility, muscular endurance and strength shown through measurements of functional capacity and personal statement of participant.       Able to understand and use rate of perceived exertion (RPE) scale  Yes       Intervention  Provide education and explanation on how to use RPE scale       Expected Outcomes  Short Term: Able to use RPE daily in rehab to express subjective intensity level;Long Term:  Able to use RPE to guide intensity level when exercising independently       Knowledge and understanding of Target Heart Rate Range (THRR)  Yes       Intervention  Provide education and explanation of THRR including how the numbers were predicted and where they are located for reference       Expected Outcomes  Short Term: Able to state/look up THRR;Long Term: Able to use THRR to govern intensity when exercising  independently;Short Term: Able to use daily as guideline for intensity in rehab       Able to check pulse independently  Yes       Intervention  Provide education and demonstration on how to check pulse in carotid and radial arteries.;Review the importance of being able to check your own pulse for safety during independent exercise       Expected Outcomes  Short Term: Able to explain why pulse checking is important during independent exercise;Long Term: Able to check pulse independently and accurately       Understanding of Exercise Prescription  Yes       Intervention  Provide education, explanation, and written materials on patient's individual exercise prescription       Expected Outcomes  Short Term: Able to explain program exercise prescription;Long Term: Able to explain home exercise prescription to exercise independently          Exercise Goals Re-Evaluation : Exercise Goals Re-Evaluation    Row Name 06/03/17 1424 06/03/17 1426 06/09/17 1114 06/24/17 1447 07/22/17 1433     Exercise Goal Re-Evaluation   Exercise Goals Review  Able to understand and use rate of perceived exertion (RPE) scale  -  Increase Physical Activity;Able to understand and use rate of perceived exertion (RPE) scale;Knowledge and understanding of Target Heart Rate Range (THRR);Understanding of Exercise Prescription;Increase Strength and Stamina;Able to check pulse independently  Increase Physical Activity;Able to understand and  use rate of perceived exertion (RPE) scale;Knowledge and understanding of Target Heart Rate Range (THRR);Understanding of Exercise Prescription;Increase Strength and Stamina;Able to check pulse independently  Increase Physical Activity;Able to understand and use rate of perceived exertion (RPE) scale;Knowledge and understanding of Target Heart Rate Range (THRR);Understanding of Exercise Prescription;Increase Strength and Stamina;Able to check pulse independently   Comments  -  Pt demonstated use of  RPE scale with exercise.  Reviewed home exercise with pt today.  Pt plans to walk indoors at local shopping stores for exercise.  Reviewed THR, pulse, RPE, sign and symptoms, and when to call 911 or MD.  Also discussed weather considerations and indoor options.  Pt voiced understanding.  Pt is making great progress and handles WL increases very well. Pt is compliant with HEP and is able to exercise for 30 minutes without difficulty.  Pt states "everyday is getting better" Pt is now using handheld weights in cardiac rehab.   Expected Outcomes  -  Pt will continue to exercise in cardiac rehab safely.   Pt will be compliant with HEP and improve in cardiorespiratory fitness.  Pt will be compliant with HEP and improve in cardiorespiratory fitness.  Pt will continue to improve in cardiorespiratory fitness and upper extremity strength.   [place] Name 08/06/17 1053             Exercise Goal Re-Evaluation   Exercise Goals Review  Increase Physical Activity;Able to understand and use rate of perceived exertion (RPE) scale;Knowledge and understanding of Target Heart Rate Range (THRR);Understanding of Exercise Prescription;Increase Strength and Stamina;Able to check pulse independently       Comments  Pt was able to clean snow residue off the car and reported feeling great and denied s/s of SOB, CP or dizziness. Pt is also planning ahead for trip around the world in which he is getting prepared for and making plans for an exercise routine.        Expected Outcomes  Pt will continue to improve in cardiorespiratory fitness and understand exercise safety and progression while on trip           Discharge Exercise Prescription (Final Exercise Prescription Changes): Exercise Prescription Changes - 08/11/17 1700      Response to Exercise   Blood Pressure (Admit)  100/70    Blood Pressure (Exercise)  120/70    Blood Pressure (Exit)  110/60    Heart Rate (Admit)  85 bpm    Heart Rate (Exercise)  101 bpm    Heart  Rate (Exit)  72 bpm    Rating of Perceived Exertion (Exercise)  11    Symptoms  none    Duration  Continue with 45 min of aerobic exercise without signs/symptoms of physical distress.    Intensity  THRR unchanged      Progression   Progression  Continue to progress workloads to maintain intensity without signs/symptoms of physical distress.    Average METs  2.7      Treadmill   MPH  2.2    Grade  1    Minutes  10    METs  2.99      Recumbant Bike   Level  3.5    Minutes  10    METs  2.4      NuStep   Level  4    SPM  100    Minutes  10    METs  2.7      Home Exercise Plan   Plans to continue  exercise at  Home (comment)    Frequency  Add 2 additional days to program exercise sessions.    Initial Home Exercises Provided  06/09/17       Nutrition:  Target Goals: Understanding of nutrition guidelines, daily intake of sodium 1500mg , cholesterol 200mg , calories 30% from fat and 7% or less from saturated fats, daily to have 5 or more servings of fruits and vegetables.  Biometrics: Pre Biometrics - 05/29/17 1027      Pre Biometrics   Waist Circumference  51 inches    Hip Circumference  49.5 inches    Waist to Hip Ratio  1.03 %    Triceps Skinfold  29 mm    % Body Fat  40.7 %    Grip Strength  39 kg    Flexibility  7.5 in    Single Leg Stand  4.45 seconds      Post Biometrics - 08/13/17 1119       Post  Biometrics   Height  5' 7.75" (1.721 m)    Weight  265 lb 14 oz (120.6 kg)    Waist Circumference  50.25 inches    Hip Circumference  48 inches    Waist to Hip Ratio  1.05 %    BMI (Calculated)  40.72    Triceps Skinfold  29 mm    % Body Fat  40.5 %    Grip Strength  39 kg    Flexibility  9 in    Single Leg Stand  10.28 seconds       Nutrition Therapy Plan and Nutrition Goals: Nutrition Therapy & Goals - 06/04/17 0908      Nutrition Therapy   Diet  Carb Modified, Therapeutic Lifestyle Changes      Personal Nutrition Goals   Nutrition Goal  Pt to  identify food quantities necessary to achieve weight loss of 6-24 lb (2.7-10.9 kg) at graduation from cardiac rehab.    Personal Goal #2  Pt able to name foods that affect blood glucose      Intervention Plan   Intervention  Prescribe, educate and counsel regarding individualized specific dietary modifications aiming towards targeted core components such as weight, hypertension, lipid management, diabetes, heart failure and other comorbidities.    Expected Outcomes  Short Term Goal: Understand basic principles of dietary content, such as calories, fat, sodium, cholesterol and nutrients.;Long Term Goal: Adherence to prescribed nutrition plan.       Nutrition Discharge: Nutrition Scores: Nutrition Assessments - 08/22/17 0849      MEDFICTS Scores   Pre Score  23    Post Score  27    Score Difference  4       Nutrition Goals Re-Evaluation:   Nutrition Goals Re-Evaluation:   Nutrition Goals Discharge (Final Nutrition Goals Re-Evaluation):   Psychosocial: Target Goals: Acknowledge presence or absence of significant depression and/or stress, maximize coping skills, provide positive support system. Participant is able to verbalize types and ability to use techniques and skills needed for reducing stress and depression.  Initial Review & Psychosocial Screening: Initial Psych Review & Screening - 05/29/17 1253      Barriers   Psychosocial barriers to participate in program  The patient should benefit from training in stress management and relaxation.      Screening Interventions   Interventions  Encouraged to exercise       Quality of Life Scores: Quality of Life - 08/15/17 1042      Quality of Life Scores  Health/Function Pre  29.11 %    Health/Function Post  30 %    Health/Function % Change  3.06 %    Socioeconomic Pre  29.38 %    Socioeconomic Post  30 %    Socioeconomic % Change   2.11 %    Psych/Spiritual Pre  30 %    Psych/Spiritual Post  30 %    Psych/Spiritual %  Change  0 %    Family Pre  30 %    Family Post  30 %    Family % Change  0 %    GLOBAL Pre  29.49 %    GLOBAL Post  30 %    GLOBAL % Change  1.73 %       PHQ-9: Recent Review Flowsheet Data    Depression screen [employer] 2/9 06/02/2017 01/17/2017 12/20/2014 11/12/2013   Decreased Interest 0 0 0 0   Down, Depressed, Hopeless 0 0 0 0   PHQ - 2 Score 0 0 0 0     Interpretation of Total Score  Total Score Depression Severity:  1-4 = Minimal depression, 5-9 = Mild depression, 10-14 = Moderate depression, 15-19 = Moderately severe depression, 20-27 = Severe depression   Psychosocial Evaluation and Intervention: Psychosocial Evaluation - 08/22/17 1128      Psychosocial Evaluation & Interventions   Interventions  Encouraged to exercise with the program and follow exercise prescription;Stress management education;Relaxation education    Comments  no psychosocial needs identified, no interventions necessary     Expected Outcomes  pt will exhibit positive outlook with good coping skills.    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 otherwise no psychosocial needs identified, no interventions necessary    Expected Outcomes  pt will exhibit positive outlook with good coping skills.   pt will exhibit positive outlook with good coping skills.   pt will exhibit positive outlook with good coping skills.   pt will exhibit positive outlook with good coping skills.     Interventions  Encouraged to attend Cardiac Rehabilitation for the exercise;Stress management education;Relaxation education  Encouraged to attend Cardiac Rehabilitation for the exercise;Stress management education;Relaxation education  Encouraged to attend Cardiac Rehabilitation for the exercise;Stress management education;Relaxation education  Encouraged to attend Cardiac Rehabilitation for the exercise;Stress management education;Relaxation education    Continue Psychosocial Services   No Follow up required  No Follow up required  No Follow up required  No Follow up required    Comments  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 otherwise no psychosocial needs identified, no interventions necessary    Expected Outcomes  pt will exhibit positive outlook with good coping skills.     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   Vocational Rehab Evaluation & Intervention: Vocational Rehab - 05/29/17 1225      Initial Vocational Rehab Evaluation & Intervention   Assessment shows need for Vocational Rehabilitation  No       Education: Education Goals: Education classes will be provided on a weekly basis, covering required topics. Participant will state understanding/return demonstration of topics presented.  Learning Barriers/Preferences: Learning Barriers/Preferences - 05/29/17 1030      Learning Barriers/Preferences   Learning Barriers  Sight       Education Topics: Count Your Pulse:  -Group instruction provided by verbal instruction, demonstration, patient participation and written materials to support subject.  Instructors address importance of being able to find your pulse and how to count your pulse when at home without a heart monitor.  Patients get hands on experience counting their pulse with staff help and individually.   CARDIAC REHAB PHASE II EXERCISE from 08/20/2017 in [place]  Date  06/27/17  Instruction Review Code  2- meets goals/outcomes      Heart Attack, Angina, and Risk Factor Modification:  -Group instruction provided by verbal instruction, video, and written materials to support subject.  Instructors address signs and symptoms of angina and heart attacks.    Also discuss risk factors for heart disease and how to make changes to improve heart health risk factors.   CARDIAC REHAB PHASE II EXERCISE from 08/20/2017 in [place]  Date  06/25/17  Instruction Review Code  2- meets goals/outcomes      Functional Fitness:  -Group instruction provided by verbal instruction, demonstration, patient participation, and written materials to  support subject.  Instructors address safety measures for doing things around the house.  Discuss how to get up and down off the floor, how to pick things up properly, how to safely get out of a chair without assistance, and balance training.   CARDIAC REHAB PHASE II EXERCISE from 08/20/2017 in [place]  Date  07/11/17  Instruction Review Code  2- meets goals/outcomes      Meditation and Mindfulness:  -Group instruction provided by verbal instruction, patient participation, and written materials to support subject.  Instructor addresses importance of mindfulness and meditation practice to help reduce stress and improve awareness.  Instructor also leads participants through a meditation exercise.    Stretching for Flexibility and Mobility:  -Group instruction provided by verbal instruction, patient participation,  and written materials to support subject.  Instructors lead participants through series of stretches that are designed to increase flexibility thus improving mobility.  These stretches are additional exercise for major muscle groups that are typically performed during regular warm up and cool down.   CARDIAC REHAB PHASE II EXERCISE from 08/20/2017 in [place]  Date  07/25/17  Instruction Review Code  2- meets goals/outcomes      Hands Only CPR:  -Group verbal, video, and participation provides a basic overview of AHA guidelines for community CPR. Role-play of emergencies allow participants the opportunity to practice calling for help and chest compression technique with discussion of AED use.   Hypertension: -Group verbal and written instruction that provides a basic overview of hypertension including the most recent diagnostic guidelines, risk factor reduction with self-care instructions and medication management.   CARDIAC REHAB PHASE II EXERCISE from 08/20/2017 in [place]  Date   08/01/17  Instruction Review Code  2- meets goals/outcomes       Nutrition I class: Heart Healthy Eating:  -Group instruction provided by PowerPoint slides, verbal discussion, and written materials to support subject matter. The instructor gives an explanation and review of the Therapeutic Lifestyle Changes diet recommendations, which includes a discussion on lipid goals, dietary fat, sodium, fiber, plant stanol/sterol esters, sugar, and the components of a well-balanced, healthy diet.   Nutrition II class: Lifestyle Skills:  -Group instruction provided by PowerPoint slides, verbal discussion, and written materials to support subject matter. The instructor gives an explanation and review of label reading, grocery shopping for heart health, heart healthy recipe modifications, and ways to make healthier choices when eating out.   CARDIAC REHAB PHASE II EXERCISE from 08/20/2017 in [place]  Date  06/10/17  Educator  RD  Instruction Review Code  2- meets goals/outcomes      Diabetes Question & Answer:  -Group instruction provided by PowerPoint slides, verbal discussion, and written materials to support subject matter. The instructor gives an explanation and review of diabetes co-morbidities, pre- and post-prandial blood glucose goals, pre-exercise blood glucose goals, signs, symptoms, and treatment of hypoglycemia and hyperglycemia, and foot care basics.   CARDIAC REHAB PHASE II EXERCISE from 08/20/2017 in [place]  Date  06/06/17  Educator  RD  Instruction Review Code  2- meets goals/outcomes      Diabetes Blitz:  -Group instruction provided by PowerPoint slides, verbal discussion, and written materials to support subject matter. The instructor gives an explanation and review of the physiology behind type 1 and type 2 diabetes, diabetes medications and rational behind using different medications, pre- and post-prandial  blood glucose recommendations and Hemoglobin A1c goals, diabetes diet, and exercise including blood glucose guidelines for exercising safely.    Portion Distortion:  -Group instruction provided by PowerPoint slides, verbal discussion, written materials, and food models to support subject matter. The instructor gives an explanation of serving size versus portion size, changes in portions sizes over the last 20 years, and what consists of a serving from each food group.   CARDIAC REHAB PHASE II EXERCISE from 08/20/2017 in [place]  Date  08/13/17  Educator  RD  Instruction Review Code  2- meets goals/outcomes      Stress Management:  -Group instruction provided by verbal instruction, video, and written materials to support subject matter.  Instructors review role of stress in heart disease and  how to cope with stress positively.     CARDIAC REHAB PHASE II EXERCISE from 08/20/2017 in [place]  Date  08/20/17  Instruction Review Code  2- meets goals/outcomes      Exercising on Your Own:  -Group instruction provided by verbal instruction, power point, and written materials to support subject.  Instructors discuss benefits of exercise, components of exercise, frequency and intensity of exercise, and end points for exercise.  Also discuss use of nitroglycerin and activating EMS.  Review options of places to exercise outside of rehab.  Review guidelines for sex with heart disease.   Cardiac Drugs I:  -Group instruction provided by verbal instruction and written materials to support subject.  Instructor reviews cardiac drug classes: antiplatelets, anticoagulants, beta blockers, and statins.  Instructor discusses reasons, side effects, and lifestyle considerations for each drug class.   CARDIAC REHAB PHASE II EXERCISE from 08/20/2017 in [place]  Date  07/09/17  Instruction Review Code  2- meets  goals/outcomes      Cardiac Drugs II:  -Group instruction provided by verbal instruction and written materials to support subject.  Instructor reviews cardiac drug classes: angiotensin converting enzyme inhibitors (ACE-I), angiotensin II receptor blockers (ARBs), nitrates, and calcium channel blockers.  Instructor discusses reasons, side effects, and lifestyle considerations for each drug class.   CARDIAC REHAB PHASE II EXERCISE from 08/20/2017 in [place]  Date  08/06/17  Instruction Review Code  2- meets goals/outcomes      Anatomy and Physiology of the Circulatory System:  Group verbal and written instruction and models provide basic cardiac anatomy and physiology, with the coronary electrical and arterial systems. Review of: AMI, Angina, Valve disease, Heart Failure, Peripheral Artery Disease, Cardiac Arrhythmia, Pacemakers, and the ICD.   CARDIAC REHAB PHASE II EXERCISE from 08/20/2017 in [place]  Date  07/23/17  Instruction Review Code  2- meets goals/outcomes      Other Education:  -Group or individual verbal, written, or video instructions that support the educational goals of the cardiac rehab program.   CARDIAC REHAB PHASE II EXERCISE from 08/20/2017 in [place]  Date  07/04/17 [[facility]]  Educator  RD  Instruction Review Code  2- Demonstrated Understanding      Knowledge Questionnaire Score: Knowledge Questionnaire Score - 08/15/17 1042      Knowledge Questionnaire Score   Pre Score  22/24    Post Score  22/24       Core Components/Risk Factors/Patient Goals at Admission: Personal Goals and Risk Factors at Admission - 06/02/17 0922      Core Components/Risk Factors/Patient Goals on Admission    Weight Management  Yes;Weight Maintenance;Weight Loss;Obesity    Intervention  Weight Management: Develop a combined nutrition and exercise program designed to  reach desired caloric intake, while maintaining appropriate intake of nutrient and fiber, sodium and fats, and appropriate energy expenditure required for the weight goal.    Admit Weight  262 lb 9.1 oz (119.1 kg)    Goal Weight: Short Term  255 lb (115.7 kg)    Goal Weight: Long Term  250 lb (113.4 kg)    Expected Outcomes  Short Term: Continue to assess and modify interventions until short term weight is achieved;Weight Maintenance: Understanding of the daily nutrition guidelines, which includes 25-35% calories from fat, 7% or less cal from saturated fats, less than 200mg  cholesterol, less than 1.5gm  of sodium, & 5 or more servings of fruits and vegetables daily;Weight Loss: Understanding of general recommendations for a balanced deficit meal plan, which promotes 1-2 lb weight loss per week and includes a negative energy balance of [phone] kcal/d;Understanding recommendations for meals to include 15-35% energy as protein, 25-35% energy from fat, 35-60% energy from carbohydrates, less than 200mg  of dietary cholesterol, 20-35 gm of total fiber daily;Understanding of distribution of calorie intake throughout the day with the consumption of 4-5 meals/snacks    Intervention  Provide a combined exercise and nutrition program that is supplemented with education, support and counseling about heart failure. Directed toward relieving symptoms such as shortness of breath, decreased exercise tolerance, and extremity edema.    Expected Outcomes  Improve functional capacity of life;Short term: Attendance in program 2-3 days a week with increased exercise capacity. Reported lower sodium intake. Reported increased fruit and vegetable intake. Reports medication compliance.;Short term: Daily weights obtained and reported for increase. Utilizing diuretic protocols set by physician.;Long term: Adoption of self-care skills and reduction of barriers for early signs and symptoms recognition and intervention leading to self-care  maintenance.    Hypertension  Yes    Intervention  Provide education on lifestyle modifcations including regular physical activity/exercise, weight management, moderate sodium restriction and increased consumption of fresh fruit, vegetables, and low fat dairy, alcohol moderation, and smoking cessation.;Monitor prescription use compliance.    Expected Outcomes  Short Term: Continued assessment and intervention until BP is < 140/45mm HG in hypertensive participants. < 130/80mm HG in hypertensive participants with diabetes, heart failure or chronic kidney disease.;Long Term: Maintenance of blood pressure at goal levels.    Lipids  Yes    Intervention  Provide education and support for participant on nutrition & aerobic/resistive exercise along with prescribed medications to achieve LDL 70mg , HDL >40mg .    Expected Outcomes  Short Term: Participant states understanding of desired cholesterol values and is compliant with medications prescribed. Participant is following exercise prescription and nutrition guidelines.;Long Term: Cholesterol controlled with medications as prescribed, with individualized exercise RX and with personalized nutrition plan. Value goals: LDL < 70mg , HDL > 40 mg.    Stress  Yes    Intervention  Offer individual and/or small group education and counseling on adjustment to heart disease, stress management and health-related lifestyle change. Teach and support self-help strategies.;Refer participants experiencing significant psychosocial distress to appropriate mental health specialists for further evaluation and treatment. When possible, include family members and significant others in education/counseling sessions.    Expected Outcomes  Short Term: Participant demonstrates changes in health-related behavior, relaxation and other stress management skills, ability to obtain effective social support, and compliance with psychotropic medications if prescribed.;Long Term: Emotional wellbeing  is indicated by absence of clinically significant psychosocial distress or social isolation.    Personal Goal Other  Yes    Personal Goal  pt personal goals for cardaic rehab are to learn safe, appropriate activity parameters.  pt would like to continue weight loss until goal wt 250lb achieved.     Intervention  provide exercise, nutrition and lifestyle modification education to promote safe exercise, activity levels and weight loss goals.      Expected Outcomes  pt wil participate in CR exercise, nutrition and lifestyle modification education opportunities       Core Components/Risk Factors/Patient Goals Review:  Goals and Risk Factor Review    [place] Name 06/03/17 1641 06/24/17 1511 06/27/17 1056 07/22/17 1609 08/12/17 1524     Core Components/Risk Factors/Patient Goals Review  Personal Goals Review  Weight Management/Obesity;Heart Failure;Hypertension;Lipids;Stress  Weight Management/Obesity;Heart Failure;Hypertension;Lipids;Stress  Weight Management/Obesity;Heart Failure;Hypertension;Lipids;Stress  Weight Management/Obesity;Heart Failure;Hypertension;Lipids;Stress  Weight Management/Obesity;Heart Failure;Hypertension;Lipids;Stress   Review  pt with multiple CAD RF demonstrates eagerness to participate in CR.  pt with multiple CAD RF demonstrates eagerness to participate in CR.  pt with multiple CAD RF demonstrates eagerness to participate in CR. pt pleased with his increased flexability since CR participation.  pt with multiple CAD RF demonstrates eagerness to participate in CR. pt is walking at home for exercise. pt reports increased strength/stamina to complete home tasks.   pt with multiple CAD RF demonstrates eagerness to participate in CR. pt is walking at home for exercise. pt reports increased strength/stamina to complete home tasks.    Expected Outcomes  pt will participate in CR exercise, nutrition and lifestyle modification opportunities.   pt will participate in CR exercise, nutrition and  lifestyle modification opportunities.   pt will participate in CR exercise, nutrition and lifestyle modification opportunities.   pt will participate in CR exercise, nutrition and lifestyle modification opportunities.   pt will participate in CR exercise, nutrition and lifestyle modification opportunities.    [place] Name 08/22/17 1129             Core Components/Risk Factors/Patient Goals Review   Personal Goals Review  Weight Management/Obesity;Heart Failure;Hypertension;Lipids;Stress       Review  pt completed CR program early due to scheduled travel. pt plans to continue exercising on his own. pt reports he feels well, no symptoms. pt demonstrates good compliance and understanding of heart failure, hypertension and lipid therapies.         Expected Outcomes  pt will participate in exercise, nutrition and lifestyle modification activities.           Core Components/Risk Factors/Patient Goals at Discharge (Final Review):  Goals and Risk Factor Review - 08/22/17 1129      Core Components/Risk Factors/Patient Goals Review   Personal Goals Review  Weight Management/Obesity;Heart Failure;Hypertension;Lipids;Stress    Review  pt completed CR program early due to scheduled travel. pt plans to continue exercising on his own. pt reports he feels well, no symptoms. pt demonstrates good compliance and understanding of heart failure, hypertension and lipid therapies.      Expected Outcomes  pt will participate in exercise, nutrition and lifestyle modification activities.        ITP Comments: ITP Comments    [place] Name 05/29/17 0800 06/03/17 1639 06/24/17 1511 07/22/17 1608 08/12/17 1524   ITP Comments  Medical Director, Dr. Fransico Him  30day ITP review.  pt started group exercise 06/02/2017.  pt demonstrates eagerness to participate in program  30day ITP review.  pt started group exercise 06/02/2017.  pt demonstrates eagerness to participate in program  30day ITP review.  pt with good attendance and  participation.  pt demonstrates eagerness to participate in program  30day ITP review.  pt with good attendance and participation.  pt demonstrates eagerness to participate in program   [place] Name 08/22/17 1125           ITP Comments  pt completed CR program early due to scheduled travel.           Comments:

## 2017-08-25 ENCOUNTER — Encounter (HOSPITAL_COMMUNITY): Payer: Medicare Other

## 2017-08-27 ENCOUNTER — Encounter (HOSPITAL_COMMUNITY): Payer: Medicare Other

## 2017-08-29 ENCOUNTER — Encounter (HOSPITAL_COMMUNITY): Payer: Medicare Other

## 2017-09-01 ENCOUNTER — Encounter (HOSPITAL_COMMUNITY): Payer: Medicare Other

## 2017-09-03 ENCOUNTER — Encounter (HOSPITAL_COMMUNITY): Payer: Medicare Other

## 2017-09-16 NOTE — Addendum Note (Signed)
Encounter addended by: Lowell Guitar, RN on: 09/16/2017 2:15 PM  Actions taken: Sign clinical note

## 2017-09-16 NOTE — Addendum Note (Signed)
Encounter addended by: Lowell Guitar, RN on: 09/16/2017 2:15 PM  Actions taken: Episode resolved

## 2017-09-16 NOTE — Progress Notes (Signed)
Discharge Progress Report  Patient Details  Name: Justin [name] MRN: [id number] Date of Birth: 09-Feb-1934 Referring Provider:     CARDIAC REHAB PHASE II ORIENTATION from 05/29/2017 in [place]  Referring Provider  Darlina Guys       Number of Visits: 34   Reason for Discharge:  Patient has met program and personal goals.  Smoking History:  Social History   Tobacco Use  Smoking Status Former Smoker  . Packs/day: 3.50  . Years: 13.00  . Pack years: 45.50  . Types: Cigarettes  . Last attempt to quit: 1963  . Years since quitting: 56.0  Smokeless Tobacco Never Used    Diagnosis:  S/P TAVR (transcatheter aortic valve replacement)  ADL UCSD:   Initial Exercise Prescription: Initial Exercise Prescription - 05/29/17 1000      Date of Initial Exercise RX and Referring Provider   Date  05/29/17    Referring Provider  Darlina Guys      Treadmill   MPH  1.8    Grade  0    Minutes  10    METs  2.38      Bike   Level  --    Minutes  --    METs  --      Recumbant Bike   Level  1.5    Minutes  10    METs  1.8      NuStep   Level  2    SPM  70    Minutes  10    METs  2      Prescription Details   Frequency (times per week)  3    Duration  Progress to 30 minutes of continuous aerobic without signs/symptoms of physical distress      Intensity   THRR 40-80% of Max Heartrate  56-112    Ratings of Perceived Exertion  11-13    Perceived Dyspnea  0-4      Progression   Progression  Continue to progress workloads to maintain intensity without signs/symptoms of physical distress.      Resistance Training   Training Prescription  No recent IDC placement on 05/02/17       Discharge Exercise Prescription (Final Exercise Prescription Changes): Exercise Prescription Changes - 08/22/17 1459      Response to Exercise   Blood Pressure (Admit)  102/60    Blood Pressure (Exercise)  124/66    Blood Pressure (Exit)  102/66     Heart Rate (Admit)  89 bpm    Heart Rate (Exercise)  113 bpm    Heart Rate (Exit)  85 bpm    Rating of Perceived Exertion (Exercise)  11    Symptoms  none    Duration  Continue with 45 min of aerobic exercise without signs/symptoms of physical distress.    Intensity  THRR unchanged      Progression   Progression  Continue to progress workloads to maintain intensity without signs/symptoms of physical distress.    Average METs  2.6      Treadmill   MPH  2.2    Grade  1    Minutes  10    METs  2.99      Recumbant Bike   Level  3.5    Minutes  10    METs  2.2      NuStep   Level  4    SPM  100    Minutes  10  METs  2.5      Home Exercise Plan   Plans to continue exercise at  Home (comment)    Frequency  Add 2 additional days to program exercise sessions.    Initial Home Exercises Provided  06/09/17       Functional Capacity: 6 Minute Walk    Row Name 05/29/17 0820 05/29/17 1015 05/29/17 1033     6 Minute Walk   Phase  Initial  -  -   Distance  1372 feet  -  -   Distance Feet Change  -  1372 ft  -   Walk Time  6 minutes  -  -   # of Rest Breaks  0  -  -   MPH  -  2.6  -   METS  -  1.87  -   RPE  11  -  -   VO2 Peak  -  1372  -   Symptoms  -  Yes (comment)  -   Comments  -  brief chest pressure noted at the end. Resolved at end of walk test  -   Resting HR  89 bpm  -  -   Resting BP  124/60  -  -   Resting Oxygen Saturation   96 %  -  -   Exercise Oxygen Saturation  during 6 min walk  96 %  -  -   Max Ex. HR  111 bpm  -  -   Max Ex. BP  134/64  -  -   2 Minute Post BP  -  -  122/70   Row Name 08/13/17 1118 08/13/17 1121       6 Minute Walk   Phase  Discharge  -    Distance  1531 feet  -    Distance % Change  -  11.59 %    Distance Feet Change  -  159 ft    Walk Time  6 minutes  -    # of Rest Breaks  0  -    MPH  2.9  -    METS  2.1  -    RPE  13  -    VO2 Peak  7.35  -    Symptoms  No  -    Resting HR  81 bpm  -    Resting BP  126/60  -    Max  Ex. HR  106 bpm  -    Max Ex. BP  138/62  -    2 Minute Post BP  94/64 recheck after water, 112/64  -       Psychological, QOL, Others - Outcomes: PHQ 2/9: Depression screen [facility] 2/9 06/02/2017 01/17/2017 12/20/2014 11/12/2013  Decreased Interest 0 0 0 0  Down, Depressed, Hopeless 0 0 0 0  PHQ - 2 Score 0 0 0 0  Some recent data might be hidden    Quality of Life: Quality of Life - 08/15/17 1042      Quality of Life Scores   Health/Function Pre  29.11 %    Health/Function Post  30 %    Health/Function % Change  3.06 %    Socioeconomic Pre  29.38 %    Socioeconomic Post  30 %    Socioeconomic % Change   2.11 %    Psych/Spiritual Pre  30 %    Psych/Spiritual Post  30 %    Psych/Spiritual % Change  0 %  Family Pre  30 %    Family Post  30 %    Family % Change  0 %    GLOBAL Pre  29.49 %    GLOBAL Post  30 %    GLOBAL % Change  1.73 %       Personal Goals: Goals established at orientation with interventions provided to work toward goal. Personal Goals and Risk Factors at Admission - 06/02/17 0922      Core Components/Risk Factors/Patient Goals on Admission    Weight Management  Yes;Weight Maintenance;Weight Loss;Obesity    Intervention  Weight Management: Develop a combined nutrition and exercise program designed to reach desired caloric intake, while maintaining appropriate intake of nutrient and fiber, sodium and fats, and appropriate energy expenditure required for the weight goal.    Admit Weight  262 lb 9.1 oz (119.1 kg)    Goal Weight: Short Term  255 lb (115.7 kg)    Goal Weight: Long Term  250 lb (113.4 kg)    Expected Outcomes  Short Term: Continue to assess and modify interventions until short term weight is achieved;Weight Maintenance: Understanding of the daily nutrition guidelines, which includes 25-35% calories from fat, 7% or less cal from saturated fats, less than 266m cholesterol, less than 1.5gm of sodium, & 5 or more servings of fruits and vegetables  daily;Weight Loss: Understanding of general recommendations for a balanced deficit meal plan, which promotes 1-2 lb weight loss per week and includes a negative energy balance of [phone] kcal/d;Understanding recommendations for meals to include 15-35% energy as protein, 25-35% energy from fat, 35-60% energy from carbohydrates, less than 2019mof dietary cholesterol, 20-35 gm of total fiber daily;Understanding of distribution of calorie intake throughout the day with the consumption of 4-5 meals/snacks    Intervention  Provide a combined exercise and nutrition program that is supplemented with education, support and counseling about heart failure. Directed toward relieving symptoms such as shortness of breath, decreased exercise tolerance, and extremity edema.    Expected Outcomes  Improve functional capacity of life;Short term: Attendance in program 2-3 days a week with increased exercise capacity. Reported lower sodium intake. Reported increased fruit and vegetable intake. Reports medication compliance.;Short term: Daily weights obtained and reported for increase. Utilizing diuretic protocols set by physician.;Long term: Adoption of self-care skills and reduction of barriers for early signs and symptoms recognition and intervention leading to self-care maintenance.    Hypertension  Yes    Intervention  Provide education on lifestyle modifcations including regular physical activity/exercise, weight management, moderate sodium restriction and increased consumption of fresh fruit, vegetables, and low fat dairy, alcohol moderation, and smoking cessation.;Monitor prescription use compliance.    Expected Outcomes  Short Term: Continued assessment and intervention until BP is < 140/9031mG in hypertensive participants. < 130/52m78m in hypertensive participants with diabetes, heart failure or chronic kidney disease.;Long Term: Maintenance of blood pressure at goal levels.    Lipids  Yes    Intervention  Provide  education and support for participant on nutrition & aerobic/resistive exercise along with prescribed medications to achieve LDL <70mg57mL >40mg.18mExpected Outcomes  Short Term: Participant states understanding of desired cholesterol values and is compliant with medications prescribed. Participant is following exercise prescription and nutrition guidelines.;Long Term: Cholesterol controlled with medications as prescribed, with individualized exercise RX and with personalized nutrition plan. Value goals: LDL < 70mg, 36m> 40 mg.    Stress  Yes    Intervention  Offer individual and/or  small group education and counseling on adjustment to heart disease, stress management and health-related lifestyle change. Teach and support self-help strategies.;Refer participants experiencing significant psychosocial distress to appropriate mental health specialists for further evaluation and treatment. When possible, include family members and significant others in education/counseling sessions.    Expected Outcomes  Short Term: Participant demonstrates changes in health-related behavior, relaxation and other stress management skills, ability to obtain effective social support, and compliance with psychotropic medications if prescribed.;Long Term: Emotional wellbeing is indicated by absence of clinically significant psychosocial distress or social isolation.    Personal Goal Other  Yes    Personal Goal  pt personal goals for cardaic rehab are to learn safe, appropriate activity parameters.  pt would like to continue weight loss until goal wt 250lb achieved.     Intervention  provide exercise, nutrition and lifestyle modification education to promote safe exercise, activity levels and weight loss goals.      Expected Outcomes  pt wil participate in CR exercise, nutrition and lifestyle modification education opportunities        Personal Goals Discharge: Goals and Risk Factor Review    Row Name 06/03/17 1641 06/24/17  1511 06/27/17 1056 07/22/17 1609 08/12/17 1524     Core Components/Risk Factors/Patient Goals Review   Personal Goals Review  Weight Management/Obesity;Heart Failure;Hypertension;Lipids;Stress  Weight Management/Obesity;Heart Failure;Hypertension;Lipids;Stress  Weight Management/Obesity;Heart Failure;Hypertension;Lipids;Stress  Weight Management/Obesity;Heart Failure;Hypertension;Lipids;Stress  Weight Management/Obesity;Heart Failure;Hypertension;Lipids;Stress   Review  pt with multiple CAD RF demonstrates eagerness to participate in CR.  pt with multiple CAD RF demonstrates eagerness to participate in CR.  pt with multiple CAD RF demonstrates eagerness to participate in CR. pt pleased with his increased flexability since CR participation.  pt with multiple CAD RF demonstrates eagerness to participate in CR. pt is walking at home for exercise. pt reports increased strength/stamina to complete home tasks.   pt with multiple CAD RF demonstrates eagerness to participate in CR. pt is walking at home for exercise. pt reports increased strength/stamina to complete home tasks.    Expected Outcomes  pt will participate in CR exercise, nutrition and lifestyle modification opportunities.   pt will participate in CR exercise, nutrition and lifestyle modification opportunities.   pt will participate in CR exercise, nutrition and lifestyle modification opportunities.   pt will participate in CR exercise, nutrition and lifestyle modification opportunities.   pt will participate in CR exercise, nutrition and lifestyle modification opportunities.    [place] Name 08/22/17 1129             Core Components/Risk Factors/Patient Goals Review   Personal Goals Review  Weight Management/Obesity;Heart Failure;Hypertension;Lipids;Stress       Review  pt completed CR program early due to scheduled travel. pt plans to continue exercising on his own. pt reports he feels well, no symptoms. pt demonstrates good compliance and  understanding of heart failure, hypertension and lipid therapies.         Expected Outcomes  pt will participate in exercise, nutrition and lifestyle modification activities.           Exercise Goals and Review: Exercise Goals    Row Name 05/29/17 0820             Exercise Goals   Increase Physical Activity  Yes       Intervention  Provide advice, education, support and counseling about physical activity/exercise needs.;Develop an individualized exercise prescription for aerobic and resistive training based on initial evaluation findings, risk stratification, comorbidities and participant's personal goals.  Expected Outcomes  Achievement of increased cardiorespiratory fitness and enhanced flexibility, muscular endurance and strength shown through measurements of functional capacity and personal statement of participant.       Increase Strength and Stamina  Yes learn activity/exercise limitations       Intervention  Provide advice, education, support and counseling about physical activity/exercise needs.;Develop an individualized exercise prescription for aerobic and resistive training based on initial evaluation findings, risk stratification, comorbidities and participant's personal goals.       Expected Outcomes  Achievement of increased cardiorespiratory fitness and enhanced flexibility, muscular endurance and strength shown through measurements of functional capacity and personal statement of participant.       Able to understand and use rate of perceived exertion (RPE) scale  Yes       Intervention  Provide education and explanation on how to use RPE scale       Expected Outcomes  Short Term: Able to use RPE daily in rehab to express subjective intensity level;Long Term:  Able to use RPE to guide intensity level when exercising independently       Knowledge and understanding of Target Heart Rate Range (THRR)  Yes       Intervention  Provide education and explanation of THRR including  how the numbers were predicted and where they are located for reference       Expected Outcomes  Short Term: Able to state/look up THRR;Long Term: Able to use THRR to govern intensity when exercising independently;Short Term: Able to use daily as guideline for intensity in rehab       Able to check pulse independently  Yes       Intervention  Provide education and demonstration on how to check pulse in carotid and radial arteries.;Review the importance of being able to check your own pulse for safety during independent exercise       Expected Outcomes  Short Term: Able to explain why pulse checking is important during independent exercise;Long Term: Able to check pulse independently and accurately       Understanding of Exercise Prescription  Yes       Intervention  Provide education, explanation, and written materials on patient's individual exercise prescription       Expected Outcomes  Short Term: Able to explain program exercise prescription;Long Term: Able to explain home exercise prescription to exercise independently          Nutrition & Weight - Outcomes: Pre Biometrics - 05/29/17 1027      Pre Biometrics   Waist Circumference  51 inches    Hip Circumference  49.5 inches    Waist to Hip Ratio  1.03 %    Triceps Skinfold  29 mm    % Body Fat  40.7 %    Grip Strength  39 kg    Flexibility  7.5 in    Single Leg Stand  4.45 seconds      Post Biometrics - 08/13/17 1119       Post  Biometrics   Height  5' 7.75" (1.721 m)    Weight  265 lb 14 oz (120.6 kg)    Waist Circumference  50.25 inches    Hip Circumference  48 inches    Waist to Hip Ratio  1.05 %    BMI (Calculated)  40.72    Triceps Skinfold  29 mm    % Body Fat  40.5 %    Grip Strength  39 kg    Flexibility  9  in    Single Leg Stand  10.28 seconds       Nutrition: Nutrition Therapy & Goals - 06/04/17 0908      Nutrition Therapy   Diet  Carb Modified, Therapeutic Lifestyle Changes      Personal Nutrition Goals    Nutrition Goal  Pt to identify food quantities necessary to achieve weight loss of 6-24 lb (2.7-10.9 kg) at graduation from cardiac rehab.    Personal Goal #2  Pt able to name foods that affect blood glucose      Intervention Plan   Intervention  Prescribe, educate and counsel regarding individualized specific dietary modifications aiming towards targeted core components such as weight, hypertension, lipid management, diabetes, heart failure and other comorbidities.    Expected Outcomes  Short Term Goal: Understand basic principles of dietary content, such as calories, fat, sodium, cholesterol and nutrients.;Long Term Goal: Adherence to prescribed nutrition plan.       Nutrition Discharge: Nutrition Assessments - 08/22/17 0849      MEDFICTS Scores   Pre Score  23    Post Score  27    Score Difference  4       Education Questionnaire Score: Knowledge Questionnaire Score - 08/15/17 1042      Knowledge Questionnaire Score   Pre Score  22/24    Post Score  22/24       Goals reviewed with patient; copy given to patient.

## 2017-12-03 ENCOUNTER — Telehealth: Payer: Self-pay | Admitting: Cardiovascular Disease

## 2017-12-03 NOTE — Telephone Encounter (Signed)
I spoke with pt's daughter. Pt will return to [place] on 5/6.  I scheduled pt to see Dr. Angelena Form on 01/08/18 at 10:40.   I told her Lenna Sciara would call her to schedule appointment with Dr [name]

## 2017-12-03 NOTE — Telephone Encounter (Signed)
New message  Pt daughter Jonelle Sidle verbalized that she is calling for RN  She stated her father has been on an [place]" [place] since January  He has been in contact with Dr.McAlhany and he told him that his device has not been working properly  She verbalized that Dr.McAlhany advised Pt to schedule appt ASAP with Dr.Taylor first ( I reached out to EP scheduler for availability) and then  Schedule an appt with him. Offered August appt for Dr.McAlhany, [place] and wait list and pt daughter declined. She want RN to call her with sooner appt

## 2017-12-03 NOTE — Telephone Encounter (Signed)
Spoke with pt's daughter informed her that once pt gets back from his trip to send in a manual transmission to check the device and make sure its working properly if there is anything concerning on the transmission than the pt will receive a call, if there is nothing of concern than pt will f/u with Dr. [name] as scheduled. Pts daughter voiced understanding

## 2017-12-03 NOTE — Telephone Encounter (Signed)
Daughter reports pt is currently in [place] and device is not sending proper signal. She does not know if he is having problems with device but wants to be checked out upon return

## 2017-12-19 ENCOUNTER — Encounter: Payer: Self-pay | Admitting: Cardiovascular Disease

## 2017-12-30 ENCOUNTER — Encounter: Payer: Self-pay | Admitting: Adult Health

## 2017-12-30 ENCOUNTER — Ambulatory Visit: Payer: Medicare Other | Admitting: Adult Health

## 2017-12-30 ENCOUNTER — Ambulatory Visit (INDEPENDENT_AMBULATORY_CARE_PROVIDER_SITE_OTHER): Payer: Medicare Other | Admitting: Family Medicine

## 2017-12-30 ENCOUNTER — Encounter: Payer: Self-pay | Admitting: Family Medicine

## 2017-12-30 ENCOUNTER — Ambulatory Visit (INDEPENDENT_AMBULATORY_CARE_PROVIDER_SITE_OTHER)
Admission: RE | Admit: 2017-12-30 | Discharge: 2017-12-30 | Disposition: A | Discharge status: Home / Self Care | Payer: Medicare Other | Source: Ambulatory Visit | Attending: Adult Health | Admitting: Adult Health

## 2017-12-30 VITALS — BP 108/62 | HR 69 | Temp 97.9°F | Ht 67.0 in | Wt 256.2 lb

## 2017-12-30 VITALS — BP 106/58 | HR 94 | Ht 67.0 in | Wt 254.6 lb

## 2017-12-30 DIAGNOSIS — I2510 Atherosclerotic heart disease of native coronary artery without angina pectoris: Secondary | ICD-10-CM

## 2017-12-30 DIAGNOSIS — E039 Hypothyroidism, unspecified: Secondary | ICD-10-CM | POA: Diagnosis not present

## 2017-12-30 DIAGNOSIS — H43813 Vitreous degeneration, bilateral: Secondary | ICD-10-CM | POA: Diagnosis not present

## 2017-12-30 DIAGNOSIS — I509 Heart failure, unspecified: Secondary | ICD-10-CM | POA: Diagnosis not present

## 2017-12-30 DIAGNOSIS — R05 Cough: Secondary | ICD-10-CM

## 2017-12-30 DIAGNOSIS — I10 Essential (primary) hypertension: Secondary | ICD-10-CM

## 2017-12-30 DIAGNOSIS — R739 Hyperglycemia, unspecified: Secondary | ICD-10-CM | POA: Diagnosis not present

## 2017-12-30 DIAGNOSIS — R600 Localized edema: Secondary | ICD-10-CM

## 2017-12-30 DIAGNOSIS — J3089 Other allergic rhinitis: Secondary | ICD-10-CM | POA: Diagnosis not present

## 2017-12-30 DIAGNOSIS — M7989 Other specified soft tissue disorders: Secondary | ICD-10-CM

## 2017-12-30 DIAGNOSIS — H353131 Nonexudative age-related macular degeneration, bilateral, early dry stage: Secondary | ICD-10-CM | POA: Diagnosis not present

## 2017-12-30 DIAGNOSIS — G629 Polyneuropathy, unspecified: Secondary | ICD-10-CM

## 2017-12-30 DIAGNOSIS — R059 Cough, unspecified: Secondary | ICD-10-CM

## 2017-12-30 DIAGNOSIS — J4520 Mild intermittent asthma, uncomplicated: Secondary | ICD-10-CM

## 2017-12-30 DIAGNOSIS — I5020 Unspecified systolic (congestive) heart failure: Secondary | ICD-10-CM | POA: Diagnosis not present

## 2017-12-30 MED ORDER — BENZONATATE 200 MG PO CAPS: 200.0000 mg | ORAL_CAPSULE | Freq: Three times a day (TID) | ORAL | 1 refills | Status: DC | PRN

## 2017-12-30 MED ORDER — AZITHROMYCIN 250 MG PO TABS: ORAL_TABLET | ORAL | 0 refills | Status: AC

## 2017-12-30 NOTE — Assessment & Plan Note (Signed)
Control for sx   Plan Patient Instructions  Continue on Symbicort 80/4.52 puffs twice a day Restart Dymista 2 puffs daily.  Continue on Zyrtec 10mg  daily .  Continue on chlortrimeton At bedtime  .  Begin Delsym 2 tsp Twice daily  For cough As needed   Begin Tessalon Three times a day  For cough As needed   May use saline spray and gel As needed  Nasal congestion  Zpack to have on hold if symptoms worsen with discolored mucus .   Keep taking Zantac in the evenings Keep  taking Prilosec in the mornings  Follow up with 4-6 months and As needed   Please contact office for sooner follow up if symptoms do not improve or worsen or seek emergency care

## 2017-12-30 NOTE — Patient Instructions (Addendum)
Continue on Symbicort 80/4.52 puffs twice a day Restart Dymista 2 puffs daily.  Continue on Zyrtec 10mg  daily .  Continue on chlortrimeton At bedtime  .  Begin Delsym 2 tsp Twice daily  For cough As needed   Begin Tessalon Three times a day  For cough As needed   May use saline spray and gel As needed  Nasal congestion  Zpack to have on hold if symptoms worsen with discolored mucus .   Keep taking Zantac in the evenings Keep  taking Prilosec in the mornings  Follow up with 4-6 months and As needed   Please contact office for sooner follow up if symptoms do not improve or worsen or seek emergency care

## 2017-12-30 NOTE — Assessment & Plan Note (Signed)
Well controlled, ongoing cough ? Post viral cough  Check cxr today   Plan  Patient Instructions  Continue on Symbicort 80/4.52 puffs twice a day Restart Dymista 2 puffs daily.  Continue on Zyrtec 10mg  daily .  Continue on chlortrimeton At bedtime  .  Begin Delsym 2 tsp Twice daily  For cough As needed   Begin Tessalon Three times a day  For cough As needed   May use saline spray and gel As needed  Nasal congestion  Zpack to have on hold if symptoms worsen with discolored mucus .   Keep taking Zantac in the evenings Keep  taking Prilosec in the mornings  Follow up with 4-6 months and As needed   Please contact office for sooner follow up if symptoms do not improve or worsen or seek emergency care

## 2017-12-30 NOTE — Progress Notes (Signed)
@Patient  ID: Justin [name], male    DOB: Jan 12, 1934, 84 y.o.   MRN: [id number]  Chief Complaint  Patient presents with  . Follow-up    Cough     Referring provider: Laurey Morale, MD  HPI: 84 yo male followed for chronic cough , mild persistent asthnma and irritable larynx syndrome.  Has CAD s/p CABG 2007, Aortic stenosis , on Pradaxa   TEST /Events  Previously treated with immunotherapy by Dr. Velora Heckler many years prior.  CT sinus 12/2015 -poor exam, no sign sinus dz noted  ENT in past neg eval  Echo 05/2016 showed moderate Aortic Valve stenosis  Follow with Dr. Fredderick Phenix for allergy  IgE ~200  Spiro 2016 mild airflow obstruction  CXR 12/2015 -clear lungs  IgE 11/2016 180    12/30/2017 Follow up: Asthma and Allergic Rhinitis  Patient returns for a 85-month follow-up.  Says has had a cough for last 3-4 months  Cough is dry . Was seen on Ship by Doctor, felt this was viral . Minimally productive, no discolored mucus .  Denies any flare of wheezing.  Patient remains on Singulair for chronic rhinitis. Ran out of Dymista 1 month ago.  Patient just returned from a round the world cruise for the last 4 months.  [name] he had a wonderful time.. Very interesting stories.  While away on vacation patient says he did have a couple of upper respiratory infections with discolored mucus . [name]  He took Z-Pak on 2 separate occasions.   He denies any chest pain, shortness of breath, edema, wheezing , hemoptysis . No nosebleeds not taking anything for cough .    Allergies  Allergen Reactions  . Peanut-Containing Drug Products Anaphylaxis  . Sulfonamide Derivatives Anaphylaxis  . Amlodipine Swelling    Swelling in ankles  . Eliquis [Apixaban] Other (See Comments)    Back/hip pain  . Lisinopril Cough    cough  . Xarelto [Rivaroxaban] Other (See Comments)    Back/hip pain    Immunization History  Administered Date(s) Administered  . Hep A / Hep B 04/06/2012, 10/07/2012  . Hepatitis B  05/07/2012  . Influenza Split 06/28/2013  . Influenza Whole 06/05/2007, 05/13/2008, 06/06/2009, 05/30/2010  . Influenza,inj,Quad PF,6+ Mos 05/20/2016  . Influenza-Unspecified 05/25/2014, 07/08/2015, 05/06/2017  . Pneumococcal Conjugate-13 09/18/2015  . Pneumococcal Polysaccharide-23 05/13/2008, 05/25/2014  . Td 05/30/2010  . Zoster 10/21/2007    Past Medical History:  Diagnosis Date  . Age-related macular degeneration, dry, both eyes   . Allergic    "24/7; 365 days/year; I'm allergic to pollens, dust, all southern grasses/trees, mold, mildue, cats, dogs" (01/07/2017)  . Asthma    sees [name]   . Benign prostatic hypertrophy    (sees Dr. Denman George  . CAD (coronary artery disease)    a. s/p CABG 2007. b. Cath 08/2016 - 4/4 patent grafts.  . Carotid bruit    carotid u/s 10/10: 0.39% bilaterally  . Chronic combined systolic and diastolic CHF (congestive heart failure) ([place])   . Complication of anesthesia 1980s   "w/anal cyst OR, he gave me a saddle block then put a narcotic in spinal cord; had a severe reaction to that" (01/07/2017)  . ED (erectile dysfunction)   . Family history of adverse reaction to anesthesia    "daughter wakes up during OR" (01/07/2017)  . GERD (gastroesophageal reflux disease)   . Gout   . HTN (hypertension)   . Hx of colonic polyps    (sees Dr. Henrene Pastor)  .  Hyperlipidemia   . Hypothyroidism   . Moderate to severe aortic stenosis    a. s/p TAVR 02/2017.  [name] Myocardial infarction ([place]) ~ 2000  . Obesity   . Osteoarthritis    "was in my knees, hands" (01/07/2017 )  . PAF (paroxysmal atrial fibrillation) ([place])    a. documented post TAVR.  [name] Precancerous skin lesion    (sees Dr. Allyson Sabal)  . Prostate cancer ([place]) dx'd ~ 2014  . 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 Packs/day: 3.50  . Years: 13.00  . Pack years: 45.50  . Types: Cigarettes  . Last attempt to quit: 1963  . Years since quitting: 56.3  Smokeless Tobacco Never Used   Counseling given: Not Answered   Outpatient Encounter Medications as of 12/30/2017  Medication Sig  . aspirin 81 MG tablet Take 1 tablet (81 mg total) by mouth daily.  [name] atorvastatin (LIPITOR) 20 MG tablet TAKE 1 TABLET ONCE DAILY. (Patient taking differently: TAKE 1 TABLET ONCE DAILY IN THE EVENING)  . budesonide-formoterol (SYMBICORT) 80-4.5 MCG/ACT inhaler Inhale 2 puffs into the lungs 2 (two) times daily.  . chlorpheniramine (CHLOR-TRIMETON) 4 MG tablet Take 8 mg by mouth 2 (two) times daily.   . dabigatran (PRADAXA) 150 MG CAPS capsule Take 1 capsule (150 mg total) by mouth 2 (two) times daily.  [name] DYMISTA 137-50 MCG/ACT SUSP USE 2 SPRAYS EACH NOSTRIL TWICE DAILY.  . furosemide (LASIX) 80 MG tablet Take 80 mg daily alternating with 80 mg twice daily.  [name] ipratropium (ATROVENT) 0.03 % nasal spray Place 2 sprays into both nostrils every 6 (six) hours as needed for rhinitis.  [name] isosorbide mononitrate (IMDUR) 30 MG 24 hr tablet TAKE 1 TABLET ONCE DAILY.  [name] Ketotifen Fumarate (ZADITOR OP) Apply 1 drop to eye daily as needed (dry eyes).  [name] levothyroxine (SYNTHROID, LEVOTHROID) 75 MCG tablet Take 1 tablet (75 mcg total) by mouth daily.  [name] losartan (COZAAR) 25 MG tablet Take 1 tablet (25 mg total) by mouth daily.  . montelukast (SINGULAIR) 10 MG tablet Take 1 tablet (10 mg total) by mouth every evening.  . Multiple Vitamins-Minerals (PRESERVISION AREDS PO) Take 2 tablets by mouth every morning.  [name] omeprazole (PRILOSEC) 20 MG capsule Take 1 capsule (20 mg total) by mouth daily.  . potassium chloride SA (K-DUR,KLOR-CON) 20 MEQ tablet Take 2 tablets by mouth twice daily alternating with 2 tablets daily.  . ranitidine (ZANTAC) 150 MG capsule Take 150 mg by mouth every evening.   .  triamcinolone cream (KENALOG) 0.1 % Apply 1 application topically daily as needed for dry skin.  [name] azithromycin (ZITHROMAX Z-PAK) 250 MG tablet Take 2 tablets (500 mg) on  Day 1,  followed by 1 tablet (250 mg) once daily on Days 2 through 5.  . benzonatate (TESSALON) 200 MG capsule Take 1 capsule (200 mg total) by mouth 3 (three) times daily as needed for cough.  . metoprolol tartrate (LOPRESSOR) 25 MG tablet Take 1 tablet (25 mg total) by mouth 2 (two) times daily.  . [DISCONTINUED] Azelastine-Fluticasone 137-50 MCG/ACT SUSP Place 2 sprays into both nostrils 2 (two) times daily. (Patient not taking: Reported on 12/30/2017)  . [DISCONTINUED] azithromycin (ZITHROMAX) 250 MG tablet Take 1 tablet (250 mg total) by mouth  as directed. (Patient not taking: Reported on 12/30/2017)  . [DISCONTINUED] benzonatate (TESSALON) 200 MG capsule TAKE 1 CAPSULE TWICE DAILY AS NEEDED FOR COUGH. (Patient not taking: Reported on 12/30/2017)  . [DISCONTINUED] isosorbide mononitrate (IMDUR) 30 MG 24 hr tablet Take 1 tablet (30 mg total) by mouth daily. (Patient not taking: Reported on 12/30/2017)  . [DISCONTINUED] potassium chloride 20 MEQ TBCR Take 20 mEq by mouth daily.   No facility-administered encounter medications on file as of 12/30/2017.      Review of Systems  Constitutional:   No  weight loss, night sweats,  Fevers, chills, fatigue, or  lassitude.  HEENT:   No headaches,  Difficulty swallowing,  Tooth/dental problems, or  Sore throat,                No sneezing, itching, ear ache,  +nasal congestion, post nasal drip,   CV:  No chest pain,  Orthopnea, PND, swelling in lower extremities, anasarca, dizziness, palpitations, syncope.   GI  No heartburn, indigestion, abdominal pain, nausea, vomiting, diarrhea, change in bowel habits, loss of appetite, bloody stools.   Resp: No chest wall deformity  Skin: no rash or lesions.  GU: no dysuria, change in color of urine, no urgency or frequency.  No flank pain, no  hematuria   MS:  No joint pain or swelling.  No decreased range of motion.  No back pain.    Physical Exam  BP (!) 106/58 (BP Location: Left Arm, Cuff Size: Normal)   Pulse 94   Ht 5\' 7"  (1.702 m)   Wt 254 lb 9.6 oz (115.5 kg)   SpO2 97%   BMI 39.88 kg/m   GEN: A/Ox3; pleasant , NAD,    HEENT:  [place]/AT,  EACs-clear, TMs-wnl, NOSE-clear, THROAT-clear, no lesions, no postnasal drip or exudate noted.   NECK:  Supple w/ fair ROM; no JVD; normal carotid impulses w/o bruits; no thyromegaly or nodules palpated; no lymphadenopathy.    RESP  Clear  P & A; w/o, wheezes/ rales/ or rhonchi. no accessory muscle use, no dullness to percussion  CARD:  RRR, no m/r/g, no peripheral edema, pulses intact, no cyanosis or clubbing.  GI:   Soft & nt; nml bowel sounds; no organomegaly or masses detected.   Musco: Warm bil, no deformities or joint swelling noted.   Neuro: alert, no focal deficits noted.    Skin: Warm, no lesions or rashes    Lab Results:  CBC  BMET  No results found for: PROBNP  Imaging: No results found.   Assessment & Plan:   Asthma, intermittent Well controlled, ongoing cough ? Post viral cough  Check cxr today   Plan  Patient Instructions  Continue on Symbicort 80/4.52 puffs twice a day Restart Dymista 2 puffs daily.  Continue on Zyrtec 10mg  daily .  Continue on chlortrimeton At bedtime  .  Begin Delsym 2 tsp Twice daily  For cough As needed   Begin Tessalon Three times a day  For cough As needed   May use saline spray and gel As needed  Nasal congestion  Zpack to have on hold if symptoms worsen with discolored mucus .   Keep taking Zantac in the evenings Keep  taking Prilosec in the mornings  Follow up with 4-6 months and As needed   Please contact office for sooner follow up if symptoms do not improve or worsen or seek emergency care          Allergic rhinitis Control for sx  Plan Patient Instructions  Continue on Symbicort 80/4.52 puffs  twice a day Restart Dymista 2 puffs daily.  Continue on Zyrtec 10mg  daily .  Continue on chlortrimeton At bedtime  .  Begin Delsym 2 tsp Twice daily  For cough As needed   Begin Tessalon Three times a day  For cough As needed   May use saline spray and gel As needed  Nasal congestion  Zpack to have on hold if symptoms worsen with discolored mucus .   Keep taking Zantac in the evenings Keep  taking Prilosec in the mornings  Follow up with 4-6 months and As needed   Please contact office for sooner follow up if symptoms do not improve or worsen or seek emergency care             Rexene Edison, NP 12/30/2017

## 2017-12-30 NOTE — Progress Notes (Signed)
   Subjective:    Patient ID: Justin [name], male    DOB: 1934/06/17, 84 y.o.   MRN: [id number]  HPI Here to follow up after he returned from his 4 month cruise around the world. He thoroughly enjoyed seeing the sights and meeting hundreds of people. He says his weight is exactly the same as when he left, which is impressive. He does have a few concerns. He has had a chronic cough since before he left and this morning he saw Tammy Parrett in the Pulmonary clinic. His lungs were clear on exam, and his CXR was clear. He asks me to check his right leg. He noticed about 2 months ago that the right leg appeared bigger around than the left leg, though his foot never swelled. The right calf seemed swollen and nard to the touch, though this has improved. There was never any pain in the leg. Also he describes numbness in both feet below the ankles which started about 3 months ago. There is no pain or swelling.    Review of Systems  Constitutional: Negative.   Respiratory: Negative.   Cardiovascular: Positive for leg swelling. Negative for chest pain and palpitations.  Neurological: Positive for numbness.       Objective:   Physical Exam  Constitutional: He is oriented to person, place, and time. He appears well-developed and well-nourished.  Neck: No thyromegaly present.  Cardiovascular: Normal rate, regular rhythm, normal heart sounds and intact distal pulses.  Pulmonary/Chest: Effort normal and breath sounds normal. No stridor. No respiratory distress. He has no wheezes. He has no rales.  Musculoskeletal:  There is no edema., but the right calf is slightly larger in diameter than the left calf, and it feels a bit firm. It is not tender. No cords are felt.   Lymphadenopathy:    He has no cervical adenopathy.  Neurological: He is alert and oriented to person, place, and time.          Assessment & Plan:  His HTN and CAD are stable. His pulmonary status seems stable. We will set up a  venous doppler for the right leg to rule out DVT and check for venous insufficiency. 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  [name], MD

## 2017-12-31 ENCOUNTER — Ambulatory Visit: Payer: Medicare Other | Admitting: Family Medicine

## 2017-12-31 LAB — HEMOGLOBIN A1C: Hgb A1c MFr Bld: 6.3 % (ref 4.6–6.5)

## 2017-12-31 LAB — CBC WITH DIFFERENTIAL/PLATELET
BASOS ABS: 0.2 10*3/uL — AB (ref 0.0–0.1)
BASOS PCT: 1.5 % (ref 0.0–3.0)
EOS ABS: 0.4 10*3/uL (ref 0.0–0.7)
Eosinophils Relative: 3.4 % (ref 0.0–5.0)
HCT: 39.7 % (ref 39.0–52.0)
Hemoglobin: 13 g/dL (ref 13.0–17.0)
LYMPHS PCT: 20.4 % (ref 12.0–46.0)
Lymphs Abs: 2.2 10*3/uL (ref 0.7–4.0)
MCHC: 32.7 g/dL (ref 30.0–36.0)
MCV: 82.9 fl (ref 78.0–100.0)
MONO ABS: 1.1 10*3/uL — AB (ref 0.1–1.0)
Monocytes Relative: 10.3 % (ref 3.0–12.0)
NEUTROS ABS: 6.9 10*3/uL (ref 1.4–7.7)
NEUTROS PCT: 64.4 % (ref 43.0–77.0)
PLATELETS: 229 10*3/uL (ref 150.0–400.0)
RBC: 4.79 Mil/uL (ref 4.22–5.81)
RDW: 16.4 % — AB (ref 11.5–15.5)
WBC: 10.7 10*3/uL — ABNORMAL HIGH (ref 4.0–10.5)

## 2017-12-31 LAB — BASIC METABOLIC PANEL
BUN: 14 mg/dL (ref 6–23)
CO2: 25 mEq/L (ref 19–32)
Calcium: 8.3 mg/dL — ABNORMAL LOW (ref 8.4–10.5)
Chloride: 104 mEq/L (ref 96–112)
Creatinine, Ser: 0.73 mg/dL (ref 0.40–1.50)
GFR: 109.46 mL/min (ref >60.00)
GLUCOSE: 123 mg/dL — AB (ref 70–99)
Potassium: 3.5 mEq/L (ref 3.5–5.1)
SODIUM: 140 meq/L (ref 135–145)

## 2017-12-31 LAB — T4, FREE: Free T4: 0.82 ng/dL (ref 0.60–1.60)

## 2017-12-31 LAB — TSH: TSH: 2.86 u[IU]/mL (ref 0.35–4.50)

## 2017-12-31 LAB — T3, FREE: T3, Free: 2.6 pg/mL (ref 2.3–4.2)

## 2017-12-31 LAB — VITAMIN B12: Vitamin B-12: 185 pg/mL — ABNORMAL LOW (ref 211–911)

## 2017-12-31 NOTE — Progress Notes (Signed)
Reviewed, agree 

## 2018-01-01 ENCOUNTER — Telehealth: Payer: Self-pay | Admitting: Internal Medicine

## 2018-01-01 NOTE — Telephone Encounter (Signed)
°  1. Has your device fired? no ° °2. Is you device beeping? no ° °3. Are you experiencing draining or swelling at device site?no ° °4. Are you calling to see if we received your device transmission?yes ° °5. Have you passed out? no ° ° ° °Please route to Device Clinic Pool °

## 2018-01-01 NOTE — Telephone Encounter (Signed)
Verbal instructions for sending a manual transmission provided. Patient verbalized understanding.

## 2018-01-05 ENCOUNTER — Ambulatory Visit (HOSPITAL_COMMUNITY)
Admission: RE | Admit: 2018-01-05 | Discharge: 2018-01-05 | Disposition: A | Discharge status: Home / Self Care | Payer: Medicare Other | Source: Ambulatory Visit | Attending: Cardiovascular Disease | Admitting: Cardiovascular Disease

## 2018-01-05 DIAGNOSIS — M7989 Other specified soft tissue disorders: Secondary | ICD-10-CM | POA: Insufficient documentation

## 2018-01-06 ENCOUNTER — Ambulatory Visit (INDEPENDENT_AMBULATORY_CARE_PROVIDER_SITE_OTHER): Payer: Medicare Other | Admitting: *Deleted

## 2018-01-06 ENCOUNTER — Ambulatory Visit (INDEPENDENT_AMBULATORY_CARE_PROVIDER_SITE_OTHER): Payer: Medicare Other | Admitting: Family Medicine

## 2018-01-06 DIAGNOSIS — I428 Other cardiomyopathies: Secondary | ICD-10-CM | POA: Diagnosis not present

## 2018-01-06 DIAGNOSIS — E538 Deficiency of other specified B group vitamins: Secondary | ICD-10-CM

## 2018-01-06 DIAGNOSIS — H04221 Epiphora due to insufficient drainage, right lacrimal gland: Secondary | ICD-10-CM | POA: Diagnosis not present

## 2018-01-06 MED ORDER — CYANOCOBALAMIN 1000 MCG/ML IJ SOLN
1000.0000 ug | Freq: Once | INTRAMUSCULAR | Status: AC
  Administered 2018-01-06: 1000 ug via INTRAMUSCULAR

## 2018-01-06 NOTE — Progress Notes (Signed)
Per orders of Dr. Fry, injection of Vitamin B 12 given by NIMMONS, SYLVIA ANN. Patient tolerated injection well. 

## 2018-01-07 NOTE — Progress Notes (Signed)
Remote ICD transmission.   

## 2018-01-08 ENCOUNTER — Ambulatory Visit: Payer: Medicare Other | Admitting: Cardiovascular Disease

## 2018-01-08 LAB — CUP PACEART REMOTE DEVICE CHECK
Battery Remaining Longevity: 75 mo
Battery Voltage: 2.99 V
Brady Statistic AS VP Percent: 92.01 %
Brady Statistic AS VS Percent: 6.61 %
Brady Statistic RA Percent Paced: 1.36 %
Brady Statistic RV Percent Paced: 66.13 %
Date Time Interrogation Session: 20190509191155
HighPow Impedance: 68 Ohm
Implantable Lead Implant Date: 20180907
Implantable Lead Implant Date: 20180907
Implantable Lead Location: 753858
Implantable Lead Model: 4396
Implantable Pulse Generator Implant Date: 20180907
Lead Channel Impedance Value: 323 Ohm
Lead Channel Impedance Value: 380 Ohm
Lead Channel Impedance Value: 456 Ohm
Lead Channel Pacing Threshold Amplitude: 0.75 V
Lead Channel Pacing Threshold Amplitude: 1 V
Lead Channel Pacing Threshold Pulse Width: 0.4 ms
Lead Channel Sensing Intrinsic Amplitude: 0.25 mV
Lead Channel Sensing Intrinsic Amplitude: 12.125 mV
Lead Channel Setting Pacing Amplitude: 1.5 V
Lead Channel Setting Pacing Pulse Width: 1 ms
Lead Channel Setting Sensing Sensitivity: 0.3 mV
MDC IDC LEAD IMPLANT DT: 20180907
MDC IDC LEAD LOCATION: 753859
MDC IDC LEAD LOCATION: 753860
MDC IDC MSMT LEADCHNL LV IMPEDANCE VALUE: 532 Ohm
MDC IDC MSMT LEADCHNL LV IMPEDANCE VALUE: 817 Ohm
MDC IDC MSMT LEADCHNL LV PACING THRESHOLD AMPLITUDE: 1 V
MDC IDC MSMT LEADCHNL LV PACING THRESHOLD PULSEWIDTH: 1 ms
MDC IDC MSMT LEADCHNL RA PACING THRESHOLD PULSEWIDTH: 0.4 ms
MDC IDC MSMT LEADCHNL RA SENSING INTR AMPL: 0.25 mV
MDC IDC MSMT LEADCHNL RV IMPEDANCE VALUE: 380 Ohm
MDC IDC MSMT LEADCHNL RV SENSING INTR AMPL: 12.125 mV
MDC IDC SET LEADCHNL LV PACING AMPLITUDE: 2 V
MDC IDC SET LEADCHNL RV PACING AMPLITUDE: 2.5 V
MDC IDC SET LEADCHNL RV PACING PULSEWIDTH: 0.4 ms
MDC IDC STAT BRADY AP VP PERCENT: 1.29 %
MDC IDC STAT BRADY AP VS PERCENT: 0.09 %

## 2018-01-13 ENCOUNTER — Ambulatory Visit (INDEPENDENT_AMBULATORY_CARE_PROVIDER_SITE_OTHER): Payer: Medicare Other | Admitting: Family Medicine

## 2018-01-13 DIAGNOSIS — E538 Deficiency of other specified B group vitamins: Secondary | ICD-10-CM | POA: Diagnosis not present

## 2018-01-13 MED ORDER — CYANOCOBALAMIN 1000 MCG/ML IJ SOLN
1000.0000 ug | Freq: Once | INTRAMUSCULAR | Status: AC
  Administered 2018-01-13: 1000 ug via INTRAMUSCULAR

## 2018-01-13 NOTE — Progress Notes (Signed)
Per orders of Dr. Fry, injection of Vitamin B 12 given by Nur Rabold ANN. Patient tolerated injection well. 

## 2018-01-20 ENCOUNTER — Ambulatory Visit (INDEPENDENT_AMBULATORY_CARE_PROVIDER_SITE_OTHER): Payer: Medicare Other | Admitting: Family Medicine

## 2018-01-20 DIAGNOSIS — E538 Deficiency of other specified B group vitamins: Secondary | ICD-10-CM | POA: Diagnosis not present

## 2018-01-20 MED ORDER — CYANOCOBALAMIN 1000 MCG/ML IJ SOLN
1000.0000 ug | Freq: Once | INTRAMUSCULAR | Status: AC
  Administered 2018-01-20: 1000 ug via INTRAMUSCULAR

## 2018-01-20 NOTE — Progress Notes (Signed)
Per orders of Dr. Fry, injection of Vitamin B 12 given by Nikyla Navedo ANN. Patient tolerated injection well. 

## 2018-01-27 ENCOUNTER — Ambulatory Visit (INDEPENDENT_AMBULATORY_CARE_PROVIDER_SITE_OTHER): Payer: Medicare Other | Admitting: Family Medicine

## 2018-01-27 DIAGNOSIS — E538 Deficiency of other specified B group vitamins: Secondary | ICD-10-CM

## 2018-01-27 DIAGNOSIS — D485 Neoplasm of uncertain behavior of skin: Secondary | ICD-10-CM | POA: Diagnosis not present

## 2018-01-27 DIAGNOSIS — C44729 Squamous cell carcinoma of skin of left lower limb, including hip: Secondary | ICD-10-CM | POA: Diagnosis not present

## 2018-01-27 DIAGNOSIS — L958 Other vasculitis limited to the skin: Secondary | ICD-10-CM | POA: Diagnosis not present

## 2018-01-27 MED ORDER — CYANOCOBALAMIN 1000 MCG/ML IJ SOLN
1000.0000 ug | Freq: Once | INTRAMUSCULAR | Status: AC
Start: 2018-01-27 — End: 2018-01-27
  Administered 2018-01-27: 1000 ug via INTRAMUSCULAR

## 2018-01-27 NOTE — Progress Notes (Signed)
Per orders of Dr. Fry, injection of Vitamin B 12 given by Tavon Corriher ANN. Patient tolerated injection well. 

## 2018-02-02 ENCOUNTER — Encounter: Payer: Self-pay | Admitting: Cardiovascular Disease

## 2018-02-02 ENCOUNTER — Ambulatory Visit: Payer: Medicare Other | Admitting: Cardiovascular Disease

## 2018-02-02 VITALS — BP 132/60 | HR 87 | Ht 67.0 in | Wt 247.8 lb

## 2018-02-02 DIAGNOSIS — I428 Other cardiomyopathies: Secondary | ICD-10-CM

## 2018-02-02 DIAGNOSIS — Z85828 Personal history of other malignant neoplasm of skin: Secondary | ICD-10-CM | POA: Diagnosis not present

## 2018-02-02 DIAGNOSIS — L959 Vasculitis limited to the skin, unspecified: Secondary | ICD-10-CM | POA: Diagnosis not present

## 2018-02-02 DIAGNOSIS — I2510 Atherosclerotic heart disease of native coronary artery without angina pectoris: Secondary | ICD-10-CM | POA: Diagnosis not present

## 2018-02-02 DIAGNOSIS — I350 Nonrheumatic aortic (valve) stenosis: Secondary | ICD-10-CM | POA: Diagnosis not present

## 2018-02-02 DIAGNOSIS — Z952 Presence of prosthetic heart valve: Secondary | ICD-10-CM

## 2018-02-02 DIAGNOSIS — L905 Scar conditions and fibrosis of skin: Secondary | ICD-10-CM | POA: Diagnosis not present

## 2018-02-02 DIAGNOSIS — I480 Paroxysmal atrial fibrillation: Secondary | ICD-10-CM | POA: Diagnosis not present

## 2018-02-02 NOTE — Patient Instructions (Addendum)
Your physician recommends that you continue on your current medications as directed. Please refer to the Current Medication list given to you today.  Your physician has requested that you have an echocardiogram. Echocardiography is a painless test that uses sound waves to create images of your heart. It provides your doctor with information about the size and shape of your heart and how well your heart's chambers and valves are working. This procedure takes approximately one hour. There are no restrictions for this procedure.  Your physician wants you to follow-up in: [place] DR.  You will receive a reminder letter in the mail two months in advance. If you don't receive a letter, please call our office to schedule the follow-up appointment.

## 2018-02-02 NOTE — Progress Notes (Signed)
Chief Complaint  Patient presents with  . Follow-up    CAD    History of Present Illness: 84 yo male with history of HTN, HLD, severe aortic stenosis s/p TAVR, PAF, chronic diastolic CHF and CAD s/p 4V CABG who is here today for one month TAVR follow up. He is known to have CAD s/p 4V CABG in March 2007 (LIMA to LAD, SVG to Diagonal, SVG to OM, SVG to RCA). Last cardiac cath in January 2018 with 4/4 patent bypass grafts. He had progression of his aortic stenosis with worsened dyspnea and acute on chronic CHF in the spring of 2018. Echo May 2018 with mildly reduced LV systolic function, IFOY=77-41%. His aortic stenosis was severe. He underwent TAVR on 03/04/17 with a 29 mm Edwards Sapien 3 valve from the right transfemoral approach.  He developed atrial fibrillation following his procedure and was placed on IV amiodarone with conversion to sinus rhythm. He has been on ASA and Eliquis (Xarelto had been used initially but pt thought it caused him to have shoulder pain). Follow up visit in our office 03/19/17 and pt noted to have irregular rhythm with intraventricular conduction delay and was felt by our EP team to represent 2:1 conduction of competing fast and slow pathways. Cardiac monitor with sinus rhythm with periods of junctional rhythm, frequent PVCs, frequent PACs and runs of a wide complex tachycardia. ICD was implanted on 05/03/17.   He is here today for follow up. The patient denies any chest pain, dyspnea, palpitations, lower extremity edema, orthopnea, PND, dizziness, near syncope or syncope. He feels great. He just got back from his 121 day around the work cruise. He had had issues with his macular degeneration.    Primary Care Physician: Laurey Morale, MD   Past Medical History:  Diagnosis Date  . Age-related macular degeneration, dry, both eyes   . Allergic    "24/7; 365 days/year; I'm allergic to pollens, dust, all southern grasses/trees, mold, mildue, cats, dogs" (01/07/2017)  .  Asthma    sees [name]   . Benign prostatic hypertrophy    (sees Dr. Denman George  . CAD (coronary artery disease)    a. s/p CABG 2007. b. Cath 08/2016 - 4/4 patent grafts.  . Carotid bruit    carotid u/s 10/10: 0.39% bilaterally  . Chronic combined systolic and diastolic CHF (congestive heart failure) ([place])   . Complication of anesthesia 1980s   "w/anal cyst OR, he gave me a saddle block then put a narcotic in spinal cord; had a severe reaction to that" (01/07/2017)  . ED (erectile dysfunction)   . Family history of adverse reaction to anesthesia    "daughter wakes up during OR" (01/07/2017)  . GERD (gastroesophageal reflux disease)   . Gout   . HTN (hypertension)   . Hx of colonic polyps    (sees Dr. Henrene Pastor)  . Hyperlipidemia   . Hypothyroidism   . Moderate to severe aortic stenosis    a. s/p TAVR 02/2017.  [name] Myocardial infarction ([place]) ~ 2000  . Obesity   . Osteoarthritis    "was in my knees, hands" (01/07/2017 )  . PAF (paroxysmal atrial fibrillation) ([place])    a. documented post TAVR.  [name] Precancerous skin lesion    (sees Dr. Allyson Sabal)  . Prostate cancer ([place]) dx'd ~ 2014  . 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   29 mm Edwards Sapien 3 transcatheter heart valve placed via percutaneous  right transfemoral approach    Past Surgical History:  Procedure Laterality Date  . ANUS SURGERY     "opened it back up cause it wouldn't heal; wound up w/a fissure" (01/07/2017)  . BIV ICD INSERTION CRT-D N/A 05/02/2017   Procedure: BIV ICD INSERTION CRT-D;  Surgeon: Deboraha Sprang, MD;  Location: [facility];  Service: Cardiovascular;  Laterality: N/A;  . CARDIAC CATHETERIZATION  10/29/2005  . CARDIAC CATHETERIZATION N/A 08/27/2016   Procedure: Right/Left Heart Cath and Coronary/Graft Angiography;  Surgeon: Burnell Blanks, MD;  Location: [facility];  Service: Cardiovascular;  Laterality: N/A;  . CATARACT EXTRACTION W/  INTRAOCULAR LENS  IMPLANT, BILATERAL Bilateral   . CATARACT EXTRACTION, BILATERAL  2012  . COLONOSCOPY  06/30/2008   no repeats needed   . CORONARY ARTERY BYPASS GRAFT  2007   "CABG X4"  . CYST EXCISION PERINEAL  1980s  . HAMMER TOE SURGERY Bilateral   . JOINT REPLACEMENT    . KNEE ARTHROPLASTY  07/30/2011   Procedure: COMPUTER ASSISTED TOTAL KNEE ARTHROPLASTY;  Surgeon: Meredith Pel;  Location: [facility];  Service: Orthopedics;  Laterality: Left;  left total knee arthroplasty  . MASTECTOMY SUBCUTANEOUS Bilateral   . ORIF FINGER / THUMB FRACTURE Right ~ 1980   "repair of thumb injury"  . PROSTATE BIOPSY    . REPLACEMENT TOTAL KNEE BILATERAL Bilateral 2012  . TEE WITHOUT CARDIOVERSION N/A 03/04/2017   Procedure: TRANSESOPHAGEAL ECHOCARDIOGRAM (TEE);  Surgeon: Burnell Blanks, MD;  Location: [facility];  Service: Open Heart Surgery;  Laterality: N/A;  . TRANSCATHETER AORTIC VALVE REPLACEMENT, TRANSFEMORAL N/A 03/04/2017   Procedure: TRANSCATHETER AORTIC VALVE REPLACEMENT, TRANSFEMORAL;  Surgeon: Burnell Blanks, MD;  Location: [facility];  Service: Open Heart Surgery;  Laterality: N/A;    Current Outpatient Medications  Medication Sig Dispense Refill  . aspirin 81 MG tablet Take 1 tablet (81 mg total) by mouth daily. 30 tablet 0  . atorvastatin (LIPITOR) 20 MG tablet TAKE 1 TABLET ONCE DAILY. (Patient taking differently: TAKE 1 TABLET ONCE DAILY IN THE EVENING) 90 tablet 3  . benzonatate (TESSALON) 200 MG capsule Take 1 capsule (200 mg total) by mouth 3 (three) times daily as needed for cough. 30 capsule 1  . budesonide-formoterol (SYMBICORT) 80-4.5 MCG/ACT inhaler Inhale 2 puffs into the lungs 2 (two) times daily. 1 Inhaler 12  . chlorpheniramine (CHLOR-TRIMETON) 4 MG tablet Take 8 mg by mouth 2 (two) times daily.     . dabigatran (PRADAXA) 150 MG CAPS capsule Take 1 capsule (150 mg total) by mouth 2 (two) times daily. 180 capsule 3  . DYMISTA 137-50 MCG/ACT SUSP USE 2 SPRAYS EACH  NOSTRIL TWICE DAILY. 23 g 11  . furosemide (LASIX) 80 MG tablet Take 80 mg daily alternating with 80 mg twice daily. 135 tablet 3  . ipratropium (ATROVENT) 0.03 % nasal spray Place 2 sprays into both nostrils every 6 (six) hours as needed for rhinitis. 30 mL 4  . isosorbide mononitrate (IMDUR) 30 MG 24 hr tablet TAKE 1 TABLET ONCE DAILY. 90 tablet 0  . Ketotifen Fumarate (ZADITOR OP) Apply 1 drop to eye daily as needed (dry eyes).    [name] levothyroxine (SYNTHROID, LEVOTHROID) 75 MCG tablet Take 1 tablet (75 mcg total) by mouth daily. 180 tablet 3  . losartan (COZAAR) 25 MG tablet Take 1 tablet (25 mg total) by mouth daily. 90 tablet 3  . montelukast (SINGULAIR) 10 MG tablet Take 1 tablet (10 mg total) by mouth  every evening. 90 tablet 4  . Multiple Vitamins-Minerals (PRESERVISION AREDS PO) Take 2 tablets by mouth every morning.    [name] omeprazole (PRILOSEC) 20 MG capsule Take 1 capsule (20 mg total) by mouth daily. 90 capsule 4  . potassium chloride SA (K-DUR,KLOR-CON) 20 MEQ tablet Take 2 tablets by mouth twice daily alternating with 2 tablets daily. 270 tablet 3  . ranitidine (ZANTAC) 150 MG capsule Take 150 mg by mouth every evening.     . triamcinolone cream (KENALOG) 0.1 % Apply 1 application topically daily as needed for dry skin.    . metoprolol tartrate (LOPRESSOR) 25 MG tablet Take 1 tablet (25 mg total) by mouth 2 (two) times daily. 180 tablet 3   No current facility-administered medications for this visit.     Allergies  Allergen Reactions  . Peanut-Containing Drug Products Anaphylaxis  . Sulfonamide Derivatives Anaphylaxis  . Amlodipine Swelling    Swelling in ankles  . Eliquis [Apixaban] Other (See Comments)    Back/hip pain  . Lisinopril Cough    cough  . Xarelto [Rivaroxaban] Other (See Comments)    Back/hip pain    Social History   Socioeconomic History  . Marital status: Married    Spouse name: Not on file  . Number of children: Not on file  . Years of education: Not  on file  . Highest education level: Not on file  Occupational History  . Occupation: Clinical biochemist    Comment: builds [employer]  . Financial resource strain: Not on file  . Food insecurity:    Worry: Not on file    Inability: Not on file  . Transportation needs:    Medical: Not on file    Non-medical: Not on file  Tobacco Use  . Smoking status: Former Smoker    Packs/day: 3.50    Years: 13.00    Pack years: 45.50    Types: Cigarettes    Last attempt to quit: 1963    Years since quitting: 56.4  . Smokeless tobacco: Never Used  Substance and Sexual Activity  . Alcohol use: Yes    Alcohol/week: 0.0 oz    Comment: 01/07/2017 "might have 2 beers 2-3 times/month"  . Drug use: No  . Sexual activity: Never  Lifestyle  . Physical activity:    Days per week: Not on file    Minutes per session: Not on file  . Stress: Not on file  Relationships  . Social connections:    Talks on phone: Not on file    Gets together: Not on file    Attends religious service: Not on file    Active member of club or organization: Not on file    Attends meetings of clubs or organizations: Not on file    Relationship status: Not on file  . Intimate partner violence:    Fear of current or ex partner: Not on file    Emotionally abused: Not on file    Physically abused: Not on file    Forced sexual activity: Not on file  Other Topics Concern  . Not on file  Social History Narrative   FH of CAD, Male 1st degree relative less than age 63.    Family History  Problem Relation Age of Onset  . Heart attack Father 59  . Heart failure Mother 45  . Uterine cancer Mother     Review of Systems:  As stated in the HPI and otherwise negative.   BP 132/60  Pulse 87   Ht 5\' 7"  (1.702 m)   Wt 247 lb 12.8 oz (112.4 kg)   SpO2 98%   BMI 38.81 kg/m   Physical Examination:  General: Well developed, well nourished, NAD  HEENT: OP clear, mucus membranes moist  SKIN: warm, dry. No  rashes. Neuro: No focal deficits  Musculoskeletal: Muscle strength 5/5 all ext  Psychiatric: Mood and affect normal  Neck: No JVD, no carotid bruits, no thyromegaly, no lymphadenopathy.  Lungs:Clear bilaterally, no wheezes, rhonci, crackles Cardiovascular: Regular rate and rhythm. No murmurs, gallops or rubs. Abdomen:Soft. Bowel sounds present. Non-tender.  Extremities: No lower extremity edema. Pulses are 2 + in the bilateral DP/PT.  Echo 04/04/17 - Procedure narrative: Transthoracic echocardiography for left   ventricular function evaluation, for right ventricular function   evaluation, and for assessment of valvular function. Image   quality was suboptimal. Intravenous contrast (Definity) was   administered to enhance regional wall motion assessment and   opacify the LV. - Left ventricle: The cavity size was normal. There was moderate   concentric hypertrophy. Systolic function was mildly to   moderately reduced. The estimated ejection fraction was in the   range of 40% to 45%. Inferior and distal inferoseptal akinesis.   Doppler parameters are consistent with abnormal left ventricular   relaxation (grade 1 diastolic dysfunction). The E/e&' ratio is   >15, suggesting elevated LV filling pressure. - Aortic valve: s/p 29 mm Sapien 3 TAVR bioprosthesis. No   obstruction. Mean gradient (S): 8 mm Hg. Peak gradient (S): 15 mm   Hg. Valve area (VTI): 1.82 cm^2. Valve area (Vmax): 1.53 cm^2.   Valve area (Vmean): 1.61 cm^2. - Aorta: Ascending aortic diameter: 39 mm (S). - Ascending aorta: The ascending aorta is mildly dilated. - Left atrium: Moderately dilated. - Right ventricle: The cavity size was mildly dilated. mildly   reduced systolic function. - Right atrium: The atrium was mildly dilated. - Atrial septum: No defect or patent foramen ovale was identified. - Inferior vena cava: The vessel was normal in size. The   respirophasic diameter changes were in the normal range (>= 50%),    consistent with normal central venous pressure.  Impressions:  - Compared to a prior study in 02/2017, the LVEF is unchanged. The   TAVR gradient is stable.    Cardiac cath 08/27/16: Diagnostic Diagram          EKG:  EKG is not ordered today The ekg ordered today demonstrates  Recent Labs: 02/27/2017: ALT 20 03/19/2017: Magnesium 2.2 12/30/2017: BUN 14; Creatinine, Ser 0.73; Hemoglobin 13.0; Platelets 229.0; Potassium 3.5; Sodium 140; TSH 2.86   Lipid Panel    Component Value Date/Time   CHOL 109 09/25/2016 0828   TRIG 133.0 09/25/2016 0828   TRIG 143 06/30/2006 1132   HDL 29.80 (L) 09/25/2016 0828   CHOLHDL 4 09/25/2016 0828   VLDL 26.6 09/25/2016 0828   LDLCALC 53 09/25/2016 0828     Wt Readings from Last 3 Encounters:  02/02/18 247 lb 12.8 oz (112.4 kg)  12/30/17 256 lb 3.2 oz (116.2 kg)  12/30/17 254 lb 9.6 oz (115.5 kg)     Other studies Reviewed: Additional studies/ records that were reviewed today include:  Review of the above records demonstrates:    Assessment and Plan:   1. CAD s/p CABG with angina: No chest pain. Continue ASA, statin, beta blocker and Imdur   2. Aortic valve stenosis: He is s/p TAVR in July 2018. He is doing well.  He is NYHA class 1. Today is his one year TAVR follow up. Echo has not been arranged. I will plan an echo now. Continue SBE prophylaxis as indicated. He is on Pradaxa.   3. HTN: BP controlled. No changes today.   4. HLD: LDL at goal in 2018. Continue statin.  5. Chronic systolic and diastolic CHF: He is down 48 lbs from his pre TAVR weight. He feels great. No LE edema today. Continue Lasix.    6. Atrial fibrillation, paroxysmal: He had post-operative atrial fibrillation following TAVR and recurrence noted at follow up in our office. He appears to be in sinus today. Continue Pradaxa and Lopressor  7. Ischemic cardiomyopathy: ICD in place.   8. Junctional bradycardia: ICD in place.   Current medicines are reviewed at  length with the patient today.  The patient does not have concerns regarding medicines.  The following changes have been made:  no change  Labs/ tests ordered today include:   Orders Placed This Encounter  Procedures  . ECHOCARDIOGRAM COMPLETE   Disposition:  Echo now. Follow up with me in 6 months.   Signed, [name], MD 02/02/2018 3:16 PM    [facility] [address], [place], [place]  [address] Phone: [phone]; Fax: [phone]

## 2018-02-03 ENCOUNTER — Ambulatory Visit (INDEPENDENT_AMBULATORY_CARE_PROVIDER_SITE_OTHER): Payer: Medicare Other | Admitting: Family Medicine

## 2018-02-03 DIAGNOSIS — E538 Deficiency of other specified B group vitamins: Secondary | ICD-10-CM | POA: Diagnosis not present

## 2018-02-03 MED ORDER — CYANOCOBALAMIN 1000 MCG/ML IJ SOLN
1000.0000 ug | Freq: Once | INTRAMUSCULAR | Status: AC
  Administered 2018-02-03: 1000 ug via INTRAMUSCULAR

## 2018-02-03 NOTE — Progress Notes (Signed)
Per orders of Dr. Fry, injection of Vitamin B 12 given by Stassi Fadely ANN. Patient tolerated injection well. 

## 2018-02-04 ENCOUNTER — Ambulatory Visit (INDEPENDENT_AMBULATORY_CARE_PROVIDER_SITE_OTHER): Payer: Medicare Other

## 2018-02-04 ENCOUNTER — Ambulatory Visit (INDEPENDENT_AMBULATORY_CARE_PROVIDER_SITE_OTHER): Payer: Medicare Other | Admitting: Orthopedic Surgery

## 2018-02-04 ENCOUNTER — Telehealth: Payer: Self-pay | Admitting: *Deleted

## 2018-02-04 ENCOUNTER — Encounter (INDEPENDENT_AMBULATORY_CARE_PROVIDER_SITE_OTHER): Payer: Self-pay | Admitting: Orthopedic Surgery

## 2018-02-04 ENCOUNTER — Other Ambulatory Visit: Payer: Self-pay | Admitting: Adult Health

## 2018-02-04 DIAGNOSIS — M25561 Pain in right knee: Secondary | ICD-10-CM | POA: Diagnosis not present

## 2018-02-04 DIAGNOSIS — Z96653 Presence of artificial knee joint, bilateral: Secondary | ICD-10-CM

## 2018-02-04 NOTE — Telephone Encounter (Signed)
Patient came to office to clarify dosing for his potassium. Patient has been on an international cruise x4 months and returned home about 1 month ago. While cruising, he had the ship doctor refill his potassium in [place]. Potassium there is dosed in mg, not mEq as in the [place]. He received 600 mg tablets in [place] and that is what he's been taking for the last 2.5 months. This is less than his usual dosing in mEq per his pharmacist, and pharmacist did not want to suddenly increase his dose. He will follow-up with Dr. Angelena Form regarding any necessary dose changes, as he prescribes this medication.

## 2018-02-05 ENCOUNTER — Telehealth: Payer: Self-pay | Admitting: Cardiovascular Disease

## 2018-02-05 NOTE — Telephone Encounter (Signed)
New message    Pt c/o medication issue:  1. Name of Medication:  potassium chloride SA (K-DUR,KLOR-CON) 20 MEQ tablet Take 2 tablets by mouth twice daily alternating with 2 tablets daily.        2. How are you currently taking this medication (dosage and times per day)?  1/2 the dosage for 2 months , should he be taking the full dosage ? Or should he increase it gradually   3. Are you having a reaction (difficulty breathing--STAT)? no  4. What is your medication issue? His medication go messed up when he was on vacation and he has only been taking 1/2 the dosage that he was suppose to , he wants to know if he should start slowly increasing the medication to the original dosage or what would Dr Angelena Form recommend

## 2018-02-05 NOTE — Telephone Encounter (Signed)
Pt left 12/27 on cruise and returned early May.  While gone pt ran out of K+ and had to obtain it in [place].  They contacted [facility] to try to get appropriate dose.  When he returned he went to Pharmacy to get refill of K+ and Pharmacist made him aware he had only been taking half the dose of what he should have been taking.  Pt had labs 5/7 at PCP office.  K+ was 3.5, crea 0.73, BUN 14.  Will route to Dr. Angelena Form to advise on what he would like for pt to do in regards to K+?  Originally pt was prescribed K+ 49mEq BID QOD, alternating with 20mEq QOD. Has only been taking half of this dose for a couple of months.

## 2018-02-06 ENCOUNTER — Other Ambulatory Visit: Payer: Self-pay

## 2018-02-06 ENCOUNTER — Ambulatory Visit (HOSPITAL_COMMUNITY): Payer: Medicare Other | Attending: Cardiology

## 2018-02-06 DIAGNOSIS — E669 Obesity, unspecified: Secondary | ICD-10-CM | POA: Diagnosis not present

## 2018-02-06 DIAGNOSIS — I428 Other cardiomyopathies: Secondary | ICD-10-CM | POA: Insufficient documentation

## 2018-02-06 DIAGNOSIS — Z9013 Acquired absence of bilateral breasts and nipples: Secondary | ICD-10-CM | POA: Diagnosis not present

## 2018-02-06 DIAGNOSIS — Z6838 Body mass index (BMI) 38.0-38.9, adult: Secondary | ICD-10-CM | POA: Diagnosis not present

## 2018-02-06 DIAGNOSIS — I252 Old myocardial infarction: Secondary | ICD-10-CM | POA: Insufficient documentation

## 2018-02-06 DIAGNOSIS — Z48812 Encounter for surgical aftercare following surgery on the circulatory system: Secondary | ICD-10-CM | POA: Diagnosis not present

## 2018-02-06 DIAGNOSIS — I509 Heart failure, unspecified: Secondary | ICD-10-CM | POA: Diagnosis not present

## 2018-02-06 DIAGNOSIS — Z8249 Family history of ischemic heart disease and other diseases of the circulatory system: Secondary | ICD-10-CM | POA: Insufficient documentation

## 2018-02-06 DIAGNOSIS — I2510 Atherosclerotic heart disease of native coronary artery without angina pectoris: Secondary | ICD-10-CM | POA: Insufficient documentation

## 2018-02-06 DIAGNOSIS — Z952 Presence of prosthetic heart valve: Secondary | ICD-10-CM | POA: Diagnosis not present

## 2018-02-06 DIAGNOSIS — J45909 Unspecified asthma, uncomplicated: Secondary | ICD-10-CM | POA: Insufficient documentation

## 2018-02-06 DIAGNOSIS — E785 Hyperlipidemia, unspecified: Secondary | ICD-10-CM | POA: Insufficient documentation

## 2018-02-06 DIAGNOSIS — I480 Paroxysmal atrial fibrillation: Secondary | ICD-10-CM | POA: Diagnosis not present

## 2018-02-06 DIAGNOSIS — I350 Nonrheumatic aortic (valve) stenosis: Secondary | ICD-10-CM | POA: Diagnosis not present

## 2018-02-06 DIAGNOSIS — Z951 Presence of aortocoronary bypass graft: Secondary | ICD-10-CM | POA: Insufficient documentation

## 2018-02-06 DIAGNOSIS — I110 Hypertensive heart disease with heart failure: Secondary | ICD-10-CM | POA: Insufficient documentation

## 2018-02-06 MED ORDER — POTASSIUM CHLORIDE CRYS ER 20 MEQ PO TBCR: EXTENDED_RELEASE_TABLET | ORAL | 3 refills | Status: DC

## 2018-02-06 NOTE — Telephone Encounter (Signed)
I would have him resume the dose he was taking before his trip which was 40 meq BID one day and alternating with 40 me QOD. cdm

## 2018-02-06 NOTE — Telephone Encounter (Signed)
lmtcb

## 2018-02-06 NOTE — Telephone Encounter (Signed)
Patient was in the office having an echocardiogram (I was unaware when I left message to call office).  After echo, reviewed instructions with patient who verbalized understanding and agreeable to plan of care. Rx sent to [facility] per pt request

## 2018-02-07 ENCOUNTER — Encounter (INDEPENDENT_AMBULATORY_CARE_PROVIDER_SITE_OTHER): Payer: Self-pay | Admitting: Orthopedic Surgery

## 2018-02-07 NOTE — Progress Notes (Signed)
Office Visit Note   Patient: Justin [name]           Date of Birth: 16-Dec-1933           MRN: [id number] Visit Date: 02/04/2018 Requested by: Laurey Morale, MD [address], [place] [address] PCP: Laurey Morale, MD  Subjective: Chief Complaint  Patient presents with  . Right Knee - Pain    HPI: Patient presents with right knee pain.  He just returned from and around the world cruise.  He reports flareup of lateral sided right knee pain 4 weeks ago.  It is better now.  Notices mostly with sitting.  He has been walking about 2 miles a day.  It hurts him primarily going up and down steps..  He denies any mechanical symptoms in the knees.              ROS: All systems reviewed are negative as they relate to the chief complaint within the history of present illness.  Patient denies  fevers or chills.   Assessment & Plan: Visit Diagnoses:  1. Presence of artificial knee joint, bilateral     Plan: Impression is lateral sided knee pain likely iliotibial band bursitis.  No effusion and normal radiographs of the knee.  There indicated including anti-inflammatories as needed topical Aspercreme and iliotibial band stretching.  Follow-Up Instructions: Return if symptoms worsen or fail to improve.   Orders:  Orders Placed This Encounter  Procedures  . XR Knee 1-2 Views Right   No orders of the defined types were placed in this encounter.     Procedures: No procedures performed   Clinical Data: No additional findings.  Objective: Vital Signs: There were no vitals taken for this visit.  Physical Exam:   Constitutional: Patient appears well-developed HEENT:  Head: Normocephalic Eyes:EOM are normal Neck: Normal range of motion Cardiovascular: Normal rate Pulmonary/chest: Effort normal Neurologic: Patient is alert Skin: Skin is warm Psychiatric: Patient has normal mood and affect    Ortho Exam: Orthopedic exam demonstrates excellent range of motion  in both knees.  No effusion.  Does have some lateral sided tenderness but it is more around the lateral condyle and not as much at the joint line.  Pedal pulses palpable and ankle dorsiflexion intact.  Patient does have a rash on both lower extremities consistent with a virus.  Extensor mechanism is intact bilaterally.  No groin pain with internal/external rotation of the leg.  Specialty Comments:  No specialty comments available.  Imaging: No results found.   PMFS History: Patient Active Problem List   Diagnosis Date Noted  . S/P ICD (internal cardiac defibrillator) procedure 05/03/2017  . NICM (nonischemic cardiomyopathy) ([place]) 05/02/2017  . 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 Severe aortic valve stenosis 03/04/2017  . Hypokalemia due to loss of potassium with diuresis 01/10/2017  . Aortic stenosis 01/10/2017  . Systolic heart failure ([place]) 01/10/2017  . Acute on chronic diastolic CHF (congestive heart failure) ([place]) 01/07/2017  . Hypothyroidism 12/23/2016  . Prostate cancer ([place])   . 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 Edema of both legs 09/01/2014  . Asthma, intermittent 09/01/2014  . Aortic valve disorders 01/01/2011  . Coronary artery disease involving native coronary artery of native heart without angina pectoris   . ACUTE BRONCHITIS 09/06/2010  . VIRAL URI 08/24/2010  . GOUT 09/12/2009  . TINEA PEDIS 08/15/2009  . CELLULITIS, FOOT 08/15/2009  . CAD, AUTOLOGOUS BYPASS GRAFT 05/10/2009  .  MURMUR 05/08/2009  . BRUIT 05/08/2009  . MUSCLE CRAMPS 12/12/2008  . KNEE SPRAIN 09/21/2008  . BENIGN PROSTATIC HYPERTROPHY 07/07/2008  . Osteoarthritis 07/07/2008  . CONTUSION, HIP 05/13/2008  . BENIGN PROSTATIC HYPERTROPHY, WITH OBSTRUCTION 02/01/2008  . LOW BACK PAIN SYNDROME 02/01/2008  . TESTOSTERONE DEFICIENCY 07/03/2007  . HYPERLIPIDEMIA 07/03/2007  . Essential hypertension 07/03/2007  . MYOCARDIAL INFARCTION, HX OF 07/03/2007  . Allergic  rhinitis 07/03/2007  . ACTINIC KERATOSIS 07/03/2007  . COLONIC POLYPS, HX OF 07/03/2007  . ACNE ROSACEA, HX OF 07/03/2007  . 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   Past Medical History:  Diagnosis Date  . Age-related macular degeneration, dry, both eyes   . Allergic    "24/7; 365 days/year; I'm allergic to pollens, dust, all southern grasses/trees, mold, mildue, cats, dogs" (01/07/2017)  . Asthma    sees [name]   . Benign prostatic hypertrophy    (sees Dr. Denman George  . CAD (coronary artery disease)    a. s/p CABG 2007. b. Cath 08/2016 - 4/4 patent grafts.  . Carotid bruit    carotid u/s 10/10: 0.39% bilaterally  . Chronic combined systolic and diastolic CHF (congestive heart failure) ([place])   . Complication of anesthesia 1980s   "w/anal cyst OR, he gave me a saddle block then put a narcotic in spinal cord; had a severe reaction to that" (01/07/2017)  . ED (erectile dysfunction)   . Family history of adverse reaction to anesthesia    "daughter wakes up during OR" (01/07/2017)  . GERD (gastroesophageal reflux disease)   . Gout   . HTN (hypertension)   . Hx of colonic polyps    (sees Dr. Henrene Pastor)  . Hyperlipidemia   . Hypothyroidism   . Moderate to severe aortic stenosis    a. s/p TAVR 02/2017.  [name] Myocardial infarction ([place]) ~ 2000  . Obesity   . Osteoarthritis    "was in my knees, hands" (01/07/2017 )  . PAF (paroxysmal atrial fibrillation) ([place])    a. documented post TAVR.  [name] Precancerous skin lesion    (sees Dr. Allyson Sabal)  . Prostate cancer ([place]) dx'd ~ 2014  . 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 Heart attack Father 89  . Heart failure Mother 27  . Uterine cancer Mother     Past Surgical History:  Procedure Laterality Date  . ANUS SURGERY     "opened it back up cause it wouldn't  heal; wound up w/a fissure" (01/07/2017)  . BIV ICD INSERTION CRT-D N/A 05/02/2017   Procedure: BIV ICD INSERTION CRT-D;  Surgeon: Deboraha Sprang, MD;  Location: [facility];  Service: Cardiovascular;  Laterality: N/A;  . CARDIAC CATHETERIZATION  10/29/2005  . CARDIAC CATHETERIZATION N/A 08/27/2016   Procedure: Right/Left Heart Cath and Coronary/Graft Angiography;  Surgeon: Burnell Blanks, MD;  Location: [facility];  Service: Cardiovascular;  Laterality: N/A;  . CATARACT EXTRACTION W/ INTRAOCULAR LENS  IMPLANT, BILATERAL Bilateral   . CATARACT EXTRACTION, BILATERAL  2012  . COLONOSCOPY  06/30/2008   no repeats needed   . CORONARY ARTERY BYPASS GRAFT  2007   "CABG X4"  . CYST EXCISION PERINEAL  1980s  . HAMMER TOE SURGERY Bilateral   . JOINT REPLACEMENT    . KNEE ARTHROPLASTY  07/30/2011  Procedure: COMPUTER ASSISTED TOTAL KNEE ARTHROPLASTY;  Surgeon: Meredith Pel;  Location: [facility];  Service: Orthopedics;  Laterality: Left;  left total knee arthroplasty  . MASTECTOMY SUBCUTANEOUS Bilateral   . ORIF FINGER / THUMB FRACTURE Right ~ 1980   "repair of thumb injury"  . PROSTATE BIOPSY    . REPLACEMENT TOTAL KNEE BILATERAL Bilateral 2012  . TEE WITHOUT CARDIOVERSION N/A 03/04/2017   Procedure: TRANSESOPHAGEAL ECHOCARDIOGRAM (TEE);  Surgeon: Burnell Blanks, MD;  Location: [facility];  Service: Open Heart Surgery;  Laterality: N/A;  . TRANSCATHETER AORTIC VALVE REPLACEMENT, TRANSFEMORAL N/A 03/04/2017   Procedure: TRANSCATHETER AORTIC VALVE REPLACEMENT, TRANSFEMORAL;  Surgeon: Burnell Blanks, MD;  Location: [facility];  Service: Open Heart Surgery;  Laterality: N/A;   Social History   Occupational History  . Occupation: Clinical biochemist    Comment: builds malls  Tobacco Use  . Smoking status: Former Smoker    Packs/day: 3.50    Years: 13.00    Pack years: 45.50    Types: Cigarettes    Last attempt to quit: 1963    Years since quitting: 56.4  . Smokeless  tobacco: Never Used  Substance and Sexual Activity  . Alcohol use: Yes    Alcohol/week: 0.0 oz    Comment: 01/07/2017 "might have 2 beers 2-3 times/month"  . Drug use: No  . Sexual activity: Never

## 2018-02-10 ENCOUNTER — Ambulatory Visit (INDEPENDENT_AMBULATORY_CARE_PROVIDER_SITE_OTHER): Payer: Medicare Other | Admitting: *Deleted

## 2018-02-10 DIAGNOSIS — E538 Deficiency of other specified B group vitamins: Secondary | ICD-10-CM | POA: Diagnosis not present

## 2018-02-10 MED ORDER — CYANOCOBALAMIN 1000 MCG/ML IJ SOLN
1000.0000 ug | Freq: Once | INTRAMUSCULAR | Status: AC
  Administered 2018-02-10: 1000 ug via INTRAMUSCULAR

## 2018-02-10 NOTE — Progress Notes (Signed)
Per orders of Dr. Sarajane Jews, injection of B12 given by Dorrene German. Patient tolerated injection well.

## 2018-02-12 DIAGNOSIS — Z85828 Personal history of other malignant neoplasm of skin: Secondary | ICD-10-CM | POA: Diagnosis not present

## 2018-02-12 DIAGNOSIS — L570 Actinic keratosis: Secondary | ICD-10-CM | POA: Diagnosis not present

## 2018-02-12 DIAGNOSIS — L720 Epidermal cyst: Secondary | ICD-10-CM | POA: Diagnosis not present

## 2018-02-12 DIAGNOSIS — L905 Scar conditions and fibrosis of skin: Secondary | ICD-10-CM | POA: Diagnosis not present

## 2018-02-17 ENCOUNTER — Ambulatory Visit (INDEPENDENT_AMBULATORY_CARE_PROVIDER_SITE_OTHER): Payer: Medicare Other | Admitting: *Deleted

## 2018-02-17 DIAGNOSIS — E538 Deficiency of other specified B group vitamins: Secondary | ICD-10-CM

## 2018-02-17 MED ORDER — CYANOCOBALAMIN 1000 MCG/ML IJ SOLN
1000.0000 ug | Freq: Once | INTRAMUSCULAR | Status: AC
Start: 2018-02-17 — End: 2018-02-17
  Administered 2018-02-17: 1000 ug via INTRAMUSCULAR

## 2018-02-17 NOTE — Progress Notes (Signed)
Per orders of Dr. Sarajane Jews, injection of B12 given by Dorrene German. Patient tolerated injection well.

## 2018-02-24 ENCOUNTER — Ambulatory Visit (INDEPENDENT_AMBULATORY_CARE_PROVIDER_SITE_OTHER): Payer: Medicare Other | Admitting: *Deleted

## 2018-02-24 DIAGNOSIS — E538 Deficiency of other specified B group vitamins: Secondary | ICD-10-CM | POA: Diagnosis not present

## 2018-02-24 MED ORDER — CYANOCOBALAMIN 1000 MCG/ML IJ SOLN
1000.0000 ug | Freq: Once | INTRAMUSCULAR | Status: AC
  Administered 2018-02-24: 1000 ug via INTRAMUSCULAR

## 2018-02-24 NOTE — Progress Notes (Signed)
Per orders of Dr. Sarajane Jews, injection of B12 given by Dorrene German. Patient tolerated injection well.

## 2018-03-03 ENCOUNTER — Ambulatory Visit (INDEPENDENT_AMBULATORY_CARE_PROVIDER_SITE_OTHER): Payer: Medicare Other | Admitting: *Deleted

## 2018-03-03 DIAGNOSIS — E538 Deficiency of other specified B group vitamins: Secondary | ICD-10-CM

## 2018-03-03 MED ORDER — CYANOCOBALAMIN 1000 MCG/ML IJ SOLN
1000.0000 ug | Freq: Once | INTRAMUSCULAR | Status: AC
  Administered 2018-03-03: 1000 ug via INTRAMUSCULAR

## 2018-03-03 NOTE — Progress Notes (Addendum)
Patient here for monthly B12 injection.  Last injection 02/24/18.  Patient tolerated injection well.  Varney Daily, CMA

## 2018-03-09 DIAGNOSIS — C61 Malignant neoplasm of prostate: Secondary | ICD-10-CM | POA: Diagnosis not present

## 2018-03-10 ENCOUNTER — Ambulatory Visit (INDEPENDENT_AMBULATORY_CARE_PROVIDER_SITE_OTHER): Payer: Medicare Other | Admitting: *Deleted

## 2018-03-10 DIAGNOSIS — E538 Deficiency of other specified B group vitamins: Secondary | ICD-10-CM

## 2018-03-10 MED ORDER — CYANOCOBALAMIN 1000 MCG/ML IJ SOLN
1000.0000 ug | Freq: Once | INTRAMUSCULAR | Status: AC
  Administered 2018-03-10: 1000 ug via INTRAMUSCULAR

## 2018-03-10 NOTE — Progress Notes (Signed)
Per orders of Dr. Sarajane Jews, injection of B12 given by Dorrene German. Patient tolerated injection well.

## 2018-03-16 DIAGNOSIS — L570 Actinic keratosis: Secondary | ICD-10-CM | POA: Diagnosis not present

## 2018-03-16 DIAGNOSIS — L720 Epidermal cyst: Secondary | ICD-10-CM | POA: Diagnosis not present

## 2018-03-16 DIAGNOSIS — R350 Frequency of micturition: Secondary | ICD-10-CM | POA: Diagnosis not present

## 2018-03-17 ENCOUNTER — Ambulatory Visit: Payer: Medicare Other

## 2018-03-19 ENCOUNTER — Ambulatory Visit (INDEPENDENT_AMBULATORY_CARE_PROVIDER_SITE_OTHER): Payer: Medicare Other | Admitting: *Deleted

## 2018-03-19 DIAGNOSIS — E538 Deficiency of other specified B group vitamins: Secondary | ICD-10-CM

## 2018-03-19 MED ORDER — CYANOCOBALAMIN 1000 MCG/ML IJ SOLN
1000.0000 ug | Freq: Once | INTRAMUSCULAR | Status: AC
  Administered 2018-03-19: 1000 ug via INTRAMUSCULAR

## 2018-03-19 NOTE — Progress Notes (Signed)
Per orders of Dr. Sarajane Jews, injection of B12 1000 mcg given by Dorrene German. Patient tolerated injection well.

## 2018-03-24 ENCOUNTER — Ambulatory Visit: Payer: Medicare Other

## 2018-03-26 ENCOUNTER — Encounter: Payer: Self-pay | Admitting: Family Medicine

## 2018-03-26 ENCOUNTER — Ambulatory Visit (INDEPENDENT_AMBULATORY_CARE_PROVIDER_SITE_OTHER): Payer: Medicare Other | Admitting: Family Medicine

## 2018-03-26 VITALS — BP 122/76 | HR 71 | Temp 97.5°F | Ht 67.0 in | Wt 249.2 lb

## 2018-03-26 DIAGNOSIS — N410 Acute prostatitis: Secondary | ICD-10-CM

## 2018-03-26 DIAGNOSIS — S50312A Abrasion of left elbow, initial encounter: Secondary | ICD-10-CM | POA: Diagnosis not present

## 2018-03-26 DIAGNOSIS — E538 Deficiency of other specified B group vitamins: Secondary | ICD-10-CM

## 2018-03-26 DIAGNOSIS — R39198 Other difficulties with micturition: Secondary | ICD-10-CM

## 2018-03-26 LAB — POCT URINALYSIS DIPSTICK
BILIRUBIN UA: NEGATIVE
GLUCOSE UA: NEGATIVE
Ketones, UA: NEGATIVE
LEUKOCYTES UA: NEGATIVE
Nitrite, UA: NEGATIVE
Protein, UA: NEGATIVE
Spec Grav, UA: 1.015 (ref 1.010–1.025)
Urobilinogen, UA: 0.2 E.U./dL
pH, UA: 6 (ref 5.0–8.0)

## 2018-03-26 MED ORDER — CIPROFLOXACIN HCL 500 MG PO TABS: 500.0000 mg | ORAL_TABLET | Freq: Two times a day (BID) | ORAL | 0 refills | Status: DC

## 2018-03-26 MED ORDER — CYANOCOBALAMIN 1000 MCG/ML IJ SOLN
1000.0000 ug | Freq: Once | INTRAMUSCULAR | Status: AC
  Administered 2018-03-26: 1000 ug via INTRAMUSCULAR

## 2018-03-26 NOTE — Progress Notes (Signed)
   Subjective:    Patient ID: Justin [name], male    DOB: [birth year], 84 y.o.   MRN: [id number]  HPI Here for several issues. First one week ago he lost his balance stepping over rolls of carpet and fell, landing on his left side. He had a skin tear to the left elbow, which he has been dressing with Neosporin and a Bandaid since then. He asks me to check it. Also for the past 3 days he has some urgency to urinate but has difficulty passing the urine. No fever or burning.    Review of Systems  Constitutional: Negative.   Respiratory: Negative.   Cardiovascular: Negative.   Gastrointestinal: Negative.   Genitourinary: Positive for difficulty urinating and urgency. Negative for dysuria, frequency and hematuria.  Skin: Positive for wound.       Objective:   Physical Exam  Constitutional: He is oriented to person, place, and time. He appears well-developed and well-nourished.  Cardiovascular: Normal rate, regular rhythm, normal heart sounds and intact distal pulses.  Pulmonary/Chest: Effort normal and breath sounds normal.  Neurological: He is alert and oriented to person, place, and time.  Skin:  The lateral left elbow has a large skin tear which looks clean. No signs of infection. The elbow itself is intact with full ROM           Assessment & Plan:  Elbow abrasion. He will continue with his current regimen. He has a prostatitis and we will treat this with Cipro. Culture the sample.  [name], MD

## 2018-03-27 LAB — URINE CULTURE
MICRO NUMBER:: 90910960
SPECIMEN QUALITY:: ADEQUATE

## 2018-03-31 ENCOUNTER — Telehealth (HOSPITAL_COMMUNITY): Payer: Self-pay

## 2018-03-31 ENCOUNTER — Telehealth: Payer: Self-pay | Admitting: Internal Medicine

## 2018-03-31 ENCOUNTER — Telehealth: Payer: Self-pay | Admitting: Family Medicine

## 2018-03-31 DIAGNOSIS — I350 Nonrheumatic aortic (valve) stenosis: Secondary | ICD-10-CM

## 2018-03-31 DIAGNOSIS — Z952 Presence of prosthetic heart valve: Secondary | ICD-10-CM

## 2018-03-31 NOTE — Telephone Encounter (Signed)
I spoke with pt. He would like to attend cardiac rehab maintenance program.  I told him I would make referral.

## 2018-03-31 NOTE — Addendum Note (Signed)
Addended byEarley Favor, Mardene Celeste L on: 03/31/2018 11:00 AM   Modules accepted: Orders

## 2018-03-31 NOTE — Telephone Encounter (Signed)
Pat called from Dr.McAlhany's office and stated patient was in Cardiac Rehab back in 2018 and would like to get back in the program. She is going to place a new order for Cardiac Rehab Maintenance.

## 2018-03-31 NOTE — Telephone Encounter (Signed)
Call in a Zpack  ?

## 2018-03-31 NOTE — Telephone Encounter (Signed)
Copied from [place] [id number]. Topic: Quick Communication - See Telephone Encounter >> Mar 31, 2018  9:11 AM Bea Graff, NT wrote: CRM for notification. See Telephone encounter for: 03/31/18. Pt is wanting to see if a Zpac can be called in for a chest cold? Last seen 03/26/18. [place], [place] [phone] (Phone) [phone] (Fax)

## 2018-03-31 NOTE — Telephone Encounter (Signed)
Thanks. chris 

## 2018-03-31 NOTE — Telephone Encounter (Signed)
New Message:      Pt states he is ready to be authorized for the heart maintenance program at the hospital.

## 2018-04-01 MED ORDER — AZITHROMYCIN 250 MG PO TABS: ORAL_TABLET | ORAL | 0 refills | Status: DC

## 2018-04-01 NOTE — Telephone Encounter (Signed)
Rx Sent - Patient notified.

## 2018-04-02 ENCOUNTER — Ambulatory Visit: Payer: Medicare Other

## 2018-04-06 ENCOUNTER — Telehealth: Payer: Self-pay | Admitting: Cardiology

## 2018-04-06 NOTE — Telephone Encounter (Signed)
Informed pt that his remote appt should occur automatically and if it doesn't we will call him. Pt verbalized understanding.

## 2018-04-07 ENCOUNTER — Ambulatory Visit (INDEPENDENT_AMBULATORY_CARE_PROVIDER_SITE_OTHER): Payer: Medicare Other | Admitting: *Deleted

## 2018-04-07 DIAGNOSIS — I428 Other cardiomyopathies: Secondary | ICD-10-CM | POA: Diagnosis not present

## 2018-04-07 NOTE — Progress Notes (Signed)
Remote ICD transmission.   

## 2018-04-08 ENCOUNTER — Encounter: Payer: Self-pay | Admitting: Thoracic Surgery (Cardiothoracic Vascular Surgery)

## 2018-04-15 ENCOUNTER — Other Ambulatory Visit: Payer: Self-pay | Admitting: Family Medicine

## 2018-04-15 ENCOUNTER — Encounter (HOSPITAL_COMMUNITY)
Admission: RE | Admit: 2018-04-15 | Discharge: 2018-04-15 | Disposition: A | Discharge status: Home / Self Care | Payer: Self-pay | Source: Ambulatory Visit | Attending: Cardiovascular Disease | Admitting: Cardiovascular Disease

## 2018-04-17 ENCOUNTER — Encounter (HOSPITAL_COMMUNITY)
Admission: RE | Admit: 2018-04-17 | Discharge: 2018-04-17 | Disposition: A | Discharge status: Home / Self Care | Payer: Self-pay | Source: Ambulatory Visit | Attending: Cardiovascular Disease | Admitting: Cardiovascular Disease

## 2018-04-20 ENCOUNTER — Encounter (HOSPITAL_COMMUNITY)
Admission: RE | Admit: 2018-04-20 | Discharge: 2018-04-20 | Disposition: A | Discharge status: Home / Self Care | Payer: Self-pay | Source: Ambulatory Visit | Attending: Cardiovascular Disease | Admitting: Cardiovascular Disease

## 2018-04-22 ENCOUNTER — Encounter (HOSPITAL_COMMUNITY): Payer: Self-pay

## 2018-04-24 ENCOUNTER — Encounter (HOSPITAL_COMMUNITY)
Admission: RE | Admit: 2018-04-24 | Discharge: 2018-04-24 | Disposition: A | Discharge status: Home / Self Care | Payer: Self-pay | Source: Ambulatory Visit | Attending: Cardiovascular Disease | Admitting: Cardiovascular Disease

## 2018-04-29 ENCOUNTER — Encounter (HOSPITAL_COMMUNITY): Payer: Self-pay

## 2018-04-29 DIAGNOSIS — I350 Nonrheumatic aortic (valve) stenosis: Secondary | ICD-10-CM | POA: Insufficient documentation

## 2018-04-29 DIAGNOSIS — Z952 Presence of prosthetic heart valve: Secondary | ICD-10-CM | POA: Insufficient documentation

## 2018-05-01 ENCOUNTER — Encounter (HOSPITAL_COMMUNITY)
Admission: RE | Admit: 2018-05-01 | Discharge: 2018-05-01 | Disposition: A | Discharge status: Home / Self Care | Payer: Self-pay | Source: Ambulatory Visit | Attending: Cardiovascular Disease | Admitting: Cardiovascular Disease

## 2018-05-01 LAB — CUP PACEART REMOTE DEVICE CHECK
Brady Statistic AP VS Percent: 0.12 %
Brady Statistic AS VP Percent: 94.19 %
Brady Statistic RA Percent Paced: 1.78 %
Date Time Interrogation Session: 20190813101706
HIGH POWER IMPEDANCE MEASURED VALUE: 86 Ohm
Implantable Lead Implant Date: 20180907
Implantable Lead Implant Date: 20180907
Implantable Lead Location: 753859
Lead Channel Impedance Value: 380 Ohm
Lead Channel Impedance Value: 380 Ohm
Lead Channel Impedance Value: 456 Ohm
Lead Channel Impedance Value: 494 Ohm
Lead Channel Impedance Value: 589 Ohm
Lead Channel Pacing Threshold Amplitude: 0.75 V
Lead Channel Pacing Threshold Amplitude: 1 V
Lead Channel Pacing Threshold Amplitude: 1 V
Lead Channel Pacing Threshold Pulse Width: 0.4 ms
Lead Channel Pacing Threshold Pulse Width: 0.4 ms
Lead Channel Pacing Threshold Pulse Width: 1 ms
Lead Channel Setting Pacing Amplitude: 2 V
Lead Channel Setting Pacing Amplitude: 2.5 V
Lead Channel Setting Sensing Sensitivity: 0.3 mV
MDC IDC LEAD IMPLANT DT: 20180907
MDC IDC LEAD LOCATION: 753858
MDC IDC LEAD LOCATION: 753860
MDC IDC MSMT BATTERY REMAINING LONGEVITY: 73 mo
MDC IDC MSMT BATTERY VOLTAGE: 2.98 V
MDC IDC MSMT LEADCHNL LV IMPEDANCE VALUE: 836 Ohm
MDC IDC MSMT LEADCHNL RA SENSING INTR AMPL: 0.375 mV
MDC IDC MSMT LEADCHNL RA SENSING INTR AMPL: 0.375 mV
MDC IDC MSMT LEADCHNL RV SENSING INTR AMPL: 13.375 mV
MDC IDC MSMT LEADCHNL RV SENSING INTR AMPL: 13.375 mV
MDC IDC PG IMPLANT DT: 20180907
MDC IDC SET LEADCHNL LV PACING AMPLITUDE: 2 V
MDC IDC SET LEADCHNL LV PACING PULSEWIDTH: 1 ms
MDC IDC SET LEADCHNL RV PACING PULSEWIDTH: 0.4 ms
MDC IDC STAT BRADY AP VP PERCENT: 1.69 %
MDC IDC STAT BRADY AS VS PERCENT: 4 %
MDC IDC STAT BRADY RV PERCENT PACED: 73.2 %

## 2018-05-04 ENCOUNTER — Encounter (HOSPITAL_COMMUNITY)
Admission: RE | Admit: 2018-05-04 | Discharge: 2018-05-04 | Disposition: A | Discharge status: Home / Self Care | Payer: Self-pay | Source: Ambulatory Visit | Attending: Cardiovascular Disease | Admitting: Cardiovascular Disease

## 2018-05-06 ENCOUNTER — Encounter (HOSPITAL_COMMUNITY)
Admission: RE | Admit: 2018-05-06 | Discharge: 2018-05-06 | Disposition: A | Discharge status: Home / Self Care | Payer: Self-pay | Source: Ambulatory Visit | Attending: Cardiovascular Disease | Admitting: Cardiovascular Disease

## 2018-05-08 ENCOUNTER — Encounter (HOSPITAL_COMMUNITY): Payer: Self-pay

## 2018-05-11 ENCOUNTER — Encounter: Payer: Medicare Other | Admitting: Internal Medicine

## 2018-05-11 ENCOUNTER — Encounter (HOSPITAL_COMMUNITY): Payer: Self-pay

## 2018-05-12 ENCOUNTER — Encounter (HOSPITAL_COMMUNITY)
Admission: RE | Admit: 2018-05-12 | Discharge: 2018-05-12 | Disposition: A | Discharge status: Home / Self Care | Payer: Self-pay | Source: Ambulatory Visit | Attending: Cardiovascular Disease | Admitting: Cardiovascular Disease

## 2018-05-13 ENCOUNTER — Encounter (HOSPITAL_COMMUNITY)
Admission: RE | Admit: 2018-05-13 | Discharge: 2018-05-13 | Disposition: A | Discharge status: Home / Self Care | Payer: Self-pay | Source: Ambulatory Visit | Attending: Cardiovascular Disease | Admitting: Cardiovascular Disease

## 2018-05-13 ENCOUNTER — Encounter (HOSPITAL_COMMUNITY): Payer: Self-pay

## 2018-05-15 ENCOUNTER — Encounter (HOSPITAL_COMMUNITY): Payer: Self-pay

## 2018-05-15 ENCOUNTER — Ambulatory Visit: Payer: Self-pay

## 2018-05-15 ENCOUNTER — Encounter (HOSPITAL_COMMUNITY)
Admission: RE | Admit: 2018-05-15 | Discharge: 2018-05-15 | Disposition: A | Discharge status: Home / Self Care | Payer: Self-pay | Source: Ambulatory Visit | Attending: Cardiovascular Disease | Admitting: Cardiovascular Disease

## 2018-05-15 ENCOUNTER — Encounter: Payer: Self-pay | Admitting: Family Medicine

## 2018-05-15 ENCOUNTER — Ambulatory Visit (INDEPENDENT_AMBULATORY_CARE_PROVIDER_SITE_OTHER): Payer: Medicare Other | Admitting: Family Medicine

## 2018-05-15 ENCOUNTER — Encounter: Payer: Self-pay | Admitting: Gastroenterology

## 2018-05-15 VITALS — BP 122/70 | HR 69 | Temp 97.9°F | Wt 255.1 lb

## 2018-05-15 DIAGNOSIS — R1310 Dysphagia, unspecified: Secondary | ICD-10-CM | POA: Diagnosis not present

## 2018-05-15 DIAGNOSIS — Z23 Encounter for immunization: Secondary | ICD-10-CM

## 2018-05-15 DIAGNOSIS — E538 Deficiency of other specified B group vitamins: Secondary | ICD-10-CM | POA: Diagnosis not present

## 2018-05-15 LAB — VITAMIN B12: Vitamin B-12: 548 pg/mL (ref 211–911)

## 2018-05-15 MED ORDER — AZITHROMYCIN 250 MG PO TABS: ORAL_TABLET | ORAL | 2 refills | Status: DC

## 2018-05-15 MED ORDER — VALACYCLOVIR HCL 500 MG PO TABS: 500.0000 mg | ORAL_TABLET | Freq: Two times a day (BID) | ORAL | 5 refills | Status: DC

## 2018-05-15 NOTE — Progress Notes (Signed)
   Subjective:    Patient ID: Justin [name], male    DOB: 10/22/1933, 84 y.o.   MRN: [id number]  HPI Here to discuss problems swallowing. He has a long hx of GERD and he takes Omeprazole and Zantac. For the past week he has had several episodes of food getting stuck partway down when he swallows, especially meat and bread. No trouble with liquids. No ST or heartburn.    Review of Systems  Constitutional: Negative.   HENT: Positive for trouble swallowing. Negative for sore throat and voice change.   Eyes: Negative.   Respiratory: Negative.   Cardiovascular: Negative.   Gastrointestinal: Negative.        Objective:   Physical Exam  Constitutional: He appears well-developed and well-nourished.  Neck: Neck supple. No tracheal deviation present. No thyromegaly present.  Cardiovascular: Normal rate, regular rhythm, normal heart sounds and intact distal pulses.  Pulmonary/Chest: Effort normal and breath sounds normal. No stridor. No respiratory distress. He has no wheezes. He has no rales.  Abdominal: Soft. Bowel sounds are normal. He exhibits no distension and no mass. There is no tenderness. There is no rebound and no guarding.  Lymphadenopathy:    He has no cervical adenopathy.          Assessment & Plan:  Dysphagia. Refer to GI for possible upper endoscopy, dilation, etc.  [name], MD

## 2018-05-15 NOTE — Telephone Encounter (Signed)
Pt. Reports started having difficulty swallowing 2 days ago.No difficulty with liquids, just solid foods. Denies sore throat or cold symptoms. No fever. Feels "raspy deep in my throat." No shortness of breath. Appointment made for today. Reason for Disposition . [1] Swallowing difficulty AND [2] cause unknown (Exception: difficulty swallowing is a chronic symptom)  Answer Assessment - Initial Assessment Questions 1. SYMPTOM: "Are you having difficulty swallowing liquids, solids, or both?"     Just solids 2. ONSET: "When did the swallowing problems begin?"       2 days ago 3. CAUSE: "What do you think is causing the problem?"      Unsure 4. CHRONIC/RECURRENT: "Is this a new problem for you?"  If no, ask: "How long have you had this problem?" (e.g., days, weeks, months)      No 5. OTHER SYMPTOMS: "Do you have any other symptoms?" (e.g., difficulty breathing, sore throat, swollen tongue, chest pain)     No 6. PREGNANCY: "Is there any chance you are pregnant?" "When was your last menstrual period?"     n/a  Protocols used: SWALLOWING DIFFICULTY-A-AH

## 2018-05-18 ENCOUNTER — Encounter (HOSPITAL_COMMUNITY)
Admission: RE | Admit: 2018-05-18 | Discharge: 2018-05-18 | Disposition: A | Discharge status: Home / Self Care | Payer: Self-pay | Source: Ambulatory Visit | Attending: Cardiovascular Disease | Admitting: Cardiovascular Disease

## 2018-05-18 ENCOUNTER — Encounter (HOSPITAL_COMMUNITY): Payer: Self-pay

## 2018-05-19 ENCOUNTER — Telehealth: Payer: Self-pay | Admitting: Family Medicine

## 2018-05-19 MED ORDER — LIDOCAINE VISCOUS HCL 2 % MT SOLN: 10.0000 mL | Freq: Four times a day (QID) | OROMUCOSAL | 2 refills | Status: DC | PRN

## 2018-05-19 NOTE — Telephone Encounter (Signed)
Call in viscous lidocaine 2% to take 10 ml every 6 hours prn sore throat, 100 ml bottle with 2 rf

## 2018-05-19 NOTE — Telephone Encounter (Signed)
Called and spoke with pt and he is aware of meds that have been sent to the pharmacy per Dr. Sarajane Jews

## 2018-05-19 NOTE — Addendum Note (Signed)
Addended by: Elie Confer on: 05/19/2018 01:50 PM   Modules accepted: Orders

## 2018-05-19 NOTE — Telephone Encounter (Signed)
Dr. Fry please advise. Thanks  

## 2018-05-19 NOTE — Telephone Encounter (Signed)
Copied from [place] [id number]. Topic: General - Other >> May 19, 2018  8:09 AM Carolyn Stare wrote:  Pt saw Dr Sarajane Jews on 05/15/18 and can not see a GI doctor until 05/22/18 and is askinf if there is anything that he can take to help relieve the rawness in his throat . CEPACOL is helping but is wondering if there is something else

## 2018-05-20 ENCOUNTER — Encounter: Payer: Self-pay | Admitting: *Deleted

## 2018-05-20 ENCOUNTER — Encounter (HOSPITAL_COMMUNITY)
Admission: RE | Admit: 2018-05-20 | Discharge: 2018-05-20 | Disposition: A | Discharge status: Home / Self Care | Payer: Self-pay | Source: Ambulatory Visit | Attending: Cardiovascular Disease | Admitting: Cardiovascular Disease

## 2018-05-20 ENCOUNTER — Encounter (HOSPITAL_COMMUNITY): Payer: Self-pay

## 2018-05-20 DIAGNOSIS — H43813 Vitreous degeneration, bilateral: Secondary | ICD-10-CM | POA: Diagnosis not present

## 2018-05-20 DIAGNOSIS — H353132 Nonexudative age-related macular degeneration, bilateral, intermediate dry stage: Secondary | ICD-10-CM | POA: Diagnosis not present

## 2018-05-21 ENCOUNTER — Ambulatory Visit (INDEPENDENT_AMBULATORY_CARE_PROVIDER_SITE_OTHER): Payer: Medicare Other | Admitting: Pulmonary Disease

## 2018-05-21 ENCOUNTER — Encounter: Payer: Self-pay | Admitting: Pulmonary Disease

## 2018-05-21 VITALS — BP 118/72 | HR 101 | Ht 67.0 in | Wt 256.0 lb

## 2018-05-21 DIAGNOSIS — K219 Gastro-esophageal reflux disease without esophagitis: Secondary | ICD-10-CM | POA: Diagnosis not present

## 2018-05-21 DIAGNOSIS — R05 Cough: Secondary | ICD-10-CM | POA: Diagnosis not present

## 2018-05-21 DIAGNOSIS — J4520 Mild intermittent asthma, uncomplicated: Secondary | ICD-10-CM | POA: Diagnosis not present

## 2018-05-21 DIAGNOSIS — J069 Acute upper respiratory infection, unspecified: Secondary | ICD-10-CM

## 2018-05-21 DIAGNOSIS — R059 Cough, unspecified: Secondary | ICD-10-CM

## 2018-05-21 MED ORDER — AZITHROMYCIN 250 MG PO TABS: ORAL_TABLET | ORAL | 1 refills | Status: DC

## 2018-05-21 MED ORDER — BENZONATATE 200 MG PO CAPS: 200.0000 mg | ORAL_CAPSULE | Freq: Three times a day (TID) | ORAL | 1 refills | Status: DC | PRN

## 2018-05-21 NOTE — Progress Notes (Signed)
Subjective:    Patient ID: Justin [name], male    DOB: 1933/09/23, 84 y.o.   MRN: [id number]  Synopsis: Former patient of Dr. Gwenette Greet who has irritable larynx syndrome as well as mild persistent asthma.' May 2017 CT scan of the sinuses was a poor study, there is no clear acute or chronic sinusitis noted. Previously treated with immunotherapy by Dr. Velora Heckler many years prior.   HPI  Chief Complaint  Patient presents with  . Follow-up    Pt doing well overall since last ov. Pt currently has a sore throat, voice change in last 7 days, with dry cough.    Justin is back from his trip around the world. He says that he continues to take his symbicort, dymista daily.  He ran our of his tessalon perles and has been out of them for a while.   He has a sore throat and cough right now.  He has ben taking something for this with a viscous lidocaine lately.  He says that he feels like there is raw meat in his throat for 4-5 days.  He says that he is in the hospital a lot so he wonders if he was around someone sick.  He goes to cardiac rehab maintenance.  He   Past Medical History:  Diagnosis Date  . Age-related macular degeneration, dry, both eyes   . Allergic    "24/7; 365 days/year; I'm allergic to pollens, dust, all southern grasses/trees, mold, mildue, cats, dogs" (01/07/2017)  . Asthma    sees [name]   . Benign prostatic hypertrophy    (sees Dr. Denman George  . CAD (coronary artery disease)    a. s/p CABG 2007. b. Cath 08/2016 - 4/4 patent grafts.  . Carotid bruit    carotid u/s 10/10: 0.39% bilaterally  . Chronic combined systolic and diastolic CHF (congestive heart failure) ([place])   . Complication of anesthesia 1980s   "w/anal cyst OR, he gave me a saddle block then put a narcotic in spinal cord; had a severe reaction to that" (01/07/2017)  . ED (erectile dysfunction)   . Family history of adverse reaction to anesthesia    "daughter wakes up during OR" (01/07/2017)  . GERD  (gastroesophageal reflux disease)   . Gout   . HTN (hypertension)   . Hx of colonic polyps    (sees Dr. Henrene Pastor)  . Hyperlipidemia   . Hypothyroidism   . Moderate to severe aortic stenosis    a. s/p TAVR 02/2017.  [name] Myocardial infarction ([place]) ~ 2000  . Obesity   . Osteoarthritis    "was in my knees, hands" (01/07/2017 )  . PAF (paroxysmal atrial fibrillation) ([place])    a. documented post TAVR.  [name] Precancerous skin lesion    (sees Dr. Allyson Sabal)  . Prostate cancer ([place]) dx'd ~ 2014  . 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  HENT: Negative for postnasal drip, rhinorrhea and sinus pressure.   Respiratory: Positive for cough. Negative for shortness of breath and wheezing.   Cardiovascular: Negative for chest pain, palpitations and leg swelling.       Objective:   Physical Exam  Vitals:   05/21/18 1432  BP: 118/72  Pulse: (!) 101  SpO2: 95%  Weight: 256 lb (116.1 kg)  Height: 5\' 7"  (1.702 m)    RA  Gen: mildly ill appearing HENT: OP clear, TM's clear, neck supple PULM: CTA B, normal percussion CV: RRR, no mgr, trace edema GI: BS+, soft, nontender Derm: no cyanosis or rash Psyche: normal mood and affect      CBC    Component Value Date/Time   WBC 10.7 (H) 12/30/2017 1633   RBC 4.79 12/30/2017 1633   HGB 13.0 12/30/2017 1633   HGB 13.9 04/23/2017 1012   HCT 39.7 12/30/2017 1633   HCT 42.1 04/23/2017 1012   PLT 229.0 12/30/2017 1633   PLT 241 04/23/2017 1012   MCV 82.9 12/30/2017 1633   MCV 84 04/23/2017 1012   MCH 27.9 04/23/2017 1012   MCH 27.0 03/07/2017 0231   MCHC 32.7 12/30/2017 1633   RDW 16.4 (H) 12/30/2017 1633   RDW 15.8 (H) 04/23/2017 1012   LYMPHSABS 2.2 12/30/2017 1633   LYMPHSABS 2.0 04/23/2017 1012   MONOABS 1.1 (H)  12/30/2017 1633   EOSABS 0.4 12/30/2017 1633   EOSABS 0.3 04/23/2017 1012   BASOSABS 0.2 (H) 12/30/2017 1633   BASOSABS 0.1 04/23/2017 1012   Imaging: 12/2015 CT sinuses without significant disease  Labs  12/2016 IgE > 180  Records from his recent trans-aortic valve replacement reviewed     Assessment & Plan:  No diagnosis found.  Discussion: [name] suffers from an acute upper respiratory infection which in his case typically will lead to acute bronchitis if we do not treat.  He has asthma, allergic rhinitis, and gastroesophageal reflux disease all which require regular maintenance therapy and frequent reminding that he needs to be compliant with these medicines.  Plan: Upper respiratory infection: Take a Z-Pak Use Tessalon as needed for cough  Allergic rhinitis: Continue Dymista Continue Zyrtec Continue Singulair  Gastroesophageal reflux disease: Continue pantoprazole Continue famotidine  Asthma: Continue Symbicort I am glad you have had a flu shot  We will see you back in 4 to 6 months or sooner if needed   Current Outpatient Medications:  .  aspirin 81 MG tablet, Take 1 tablet (81 mg total) by mouth daily., Disp: 30 tablet, Rfl: 0 .  atorvastatin (LIPITOR) 20 MG tablet, TAKE 1 TABLET ONCE DAILY., Disp: 90 tablet, Rfl: 0 .  budesonide-formoterol (SYMBICORT) 80-4.5 MCG/ACT inhaler, Inhale 2 puffs into the lungs 2 (two) times daily., Disp: 1 Inhaler, Rfl: 12 .  chlorpheniramine (CHLOR-TRIMETON) 4 MG tablet, Take 8 mg by mouth 2 (two) times daily. , Disp: , Rfl:  .  ciprofloxacin (CIPRO) 500 MG tablet, Take 1 tablet (500 mg total) by mouth 2 (two) times daily., Disp: 20 tablet, Rfl: 0 .  dabigatran (PRADAXA) 150 MG CAPS capsule, Take 1 capsule (150 mg total) by mouth 2 (two) times daily., Disp: 180 capsule, Rfl: 3 .  DYMISTA 137-50 MCG/ACT SUSP, USE 2 SPRAYS EACH NOSTRIL TWICE DAILY., Disp: 23 g, Rfl: 11 .  furosemide (LASIX) 80 MG tablet, Take 80 mg daily  alternating with 80 mg twice daily., Disp: 135 tablet, Rfl: 3 .  ipratropium (ATROVENT) 0.03 % nasal spray, Place 2 sprays into both nostrils every 6 (six) hours as needed for rhinitis., Disp: 30 mL, Rfl: 4 .  isosorbide mononitrate (IMDUR) 30 MG 24 hr tablet, TAKE 1 TABLET ONCE DAILY., Disp: 90 tablet, Rfl: 0 .  Ketotifen Fumarate (ZADITOR OP), Apply 1 drop to eye daily as needed (dry eyes)., Disp: , Rfl:  .  levothyroxine (SYNTHROID, LEVOTHROID) 75 MCG tablet, Take 1 tablet (75  mcg total) by mouth daily., Disp: 180 tablet, Rfl: 3 .  lidocaine (XYLOCAINE) 2 % solution, Use as directed 10 mLs in the mouth or throat every 6 (six) hours as needed (sore throat)., Disp: 100 mL, Rfl: 2 .  losartan (COZAAR) 25 MG tablet, Take 1 tablet (25 mg total) by mouth daily., Disp: 90 tablet, Rfl: 3 .  montelukast (SINGULAIR) 10 MG tablet, Take 1 tablet (10 mg total) by mouth every evening., Disp: 90 tablet, Rfl: 4 .  Multiple Vitamins-Minerals (PRESERVISION AREDS PO), Take 2 tablets by mouth every morning., Disp: , Rfl:  .  omeprazole (PRILOSEC) 20 MG capsule, Take 1 capsule (20 mg total) by mouth daily., Disp: 90 capsule, Rfl: 4 .  potassium chloride SA (K-DUR,KLOR-CON) 20 MEQ tablet, Take 2 tablets by mouth twice daily alternating with 2 tablets daily., Disp: 270 tablet, Rfl: 3 .  ranitidine (ZANTAC) 150 MG capsule, Take 150 mg by mouth every evening. , Disp: , Rfl:  .  triamcinolone cream (KENALOG) 0.1 %, Apply 1 application topically daily as needed for dry skin., Disp: , Rfl:  .  valACYclovir (VALTREX) 500 MG tablet, Take 1 tablet (500 mg total) by mouth 2 (two) times daily., Disp: 60 tablet, Rfl: 5 .  metoprolol tartrate (LOPRESSOR) 25 MG tablet, Take 1 tablet (25 mg total) by mouth 2 (two) times daily., Disp: 180 tablet, Rfl: 3

## 2018-05-21 NOTE — Patient Instructions (Signed)
Upper respiratory infection: Take a Z-Pak Use Tessalon as needed for cough  Allergic rhinitis: Continue Dymista Continue Zyrtec Continue Singulair  Gastroesophageal reflux disease: Continue pantoprazole Continue famotidine  Asthma: Continue Symbicort I am glad you have had a flu shot  We will see you back in 4 to 6 months or sooner if needed

## 2018-05-22 ENCOUNTER — Encounter (HOSPITAL_COMMUNITY): Payer: Self-pay

## 2018-05-22 ENCOUNTER — Telehealth: Payer: Self-pay | Admitting: Internal Medicine

## 2018-05-22 ENCOUNTER — Ambulatory Visit: Payer: Medicare Other | Admitting: Gastroenterology

## 2018-05-22 NOTE — Telephone Encounter (Signed)
Pt states he has been having trouble swallowing since last Wednesday. Reports food has been trying to stick. Also states his esophagus feels raw, his PCP gave him a script for lidocaine and that has helped some. Instructed pt to try doubling his omeprazole in the am and continuing his zantac in the evening. Pt asked if there were doctors in [facility] that could see him as that is closer. Pt scheduled to see Dr. Bryan Lemma 05/25/18@8 :45am. Pt aware of appt.

## 2018-05-25 ENCOUNTER — Encounter: Payer: Self-pay | Admitting: Gastroenterology

## 2018-05-25 ENCOUNTER — Encounter (HOSPITAL_COMMUNITY): Payer: Self-pay

## 2018-05-25 ENCOUNTER — Ambulatory Visit: Payer: Medicare Other | Admitting: Gastroenterology

## 2018-05-25 ENCOUNTER — Telehealth: Payer: Self-pay

## 2018-05-25 VITALS — BP 122/76 | HR 99 | Ht 66.0 in | Wt 256.5 lb

## 2018-05-25 DIAGNOSIS — Z7901 Long term (current) use of anticoagulants: Secondary | ICD-10-CM

## 2018-05-25 DIAGNOSIS — R1310 Dysphagia, unspecified: Secondary | ICD-10-CM | POA: Diagnosis not present

## 2018-05-25 DIAGNOSIS — K219 Gastro-esophageal reflux disease without esophagitis: Secondary | ICD-10-CM

## 2018-05-25 NOTE — Telephone Encounter (Signed)
[facility] Pre-operative Risk Assessment     Request for surgical clearance:     Endoscopy Procedure  What type of surgery is being performed?     Endoscopy with Dil  When is this surgery scheduled?     05/29/2018  What type of clearance is required ?   Pharmacy  Are there any medications that need to be held prior to surgery and how long? Pradaxa 2 days prior  Practice name and name of physician performing surgery?      [facility]   [place], [place].  What is your office phone and fax number?      Phone- [phone]  [phone]  Anesthesia type (None, local, MAC, general) ?       MAC

## 2018-05-25 NOTE — Patient Instructions (Signed)
If you are age 82 or older, your body mass index should be between 23-30. Your Body mass index is 41.4 kg/m. If this is out of the aforementioned range listed, please consider follow up with your Primary Care Provider.  If you are age 62 or younger, your body mass index should be between 19-25. Your Body mass index is 41.4 kg/m. If this is out of the aformentioned range listed, please consider follow up with your Primary Care Provider.   You have been scheduled for an endoscopy. Please follow written instructions given to you at your visit today. If you use inhalers (even only as needed), please bring them with you on the day of your procedure. Your physician has requested that you go to www.startemmi.com and enter the access code given to you at your visit today. This web site gives a general overview about your procedure. However, you should still follow specific instructions given to you by our office regarding your preparation for the procedure.  You will be contacted by our office prior to your procedure for directions on holding your Pradaxa.  If you do not hear from our office 1 week prior to your scheduled procedure, please call [phone] to discuss.   It was a pleasure to see you today!  Vito Cirigliano, D.O.

## 2018-05-25 NOTE — Progress Notes (Signed)
Chief Complaint: Dysphagia   Referring Provider:     Laurey Morale, MD    HPI:     Justin [name] is a 84 y.o. male with a prior hx of irritable larynx syndrome and mild persistent asthma, as well as CAD w/ CABG in 2007, TAVR in 2018, paroxysmal Afib, HTN, Prostate CA, and GERD referred to the Gastroenterology Clinic for evaluation of dysphagia.   He states he 2 to 3 days of mild, progressive difficulty swallowing followed by abrupt onset difficulty swallowing chicken 12 days ago.  He points to his suprasternal notch as the site of dysphagia.  He was able to eventually clear the chicken with repeated swallows of fluids.  No prior history of dysphagia or food impactions.  Irritation persisted the following morning and he still has a feeling of "rawness "at that same level.  PCM gave Rx for lidocaine with some clinical improvement, and recommended to increase his AM prilosec and referred to the GI Clinic. Sxs have improved slightly in last couple of days. Started Z pack 5 days ago for URI sxs (had at home). Steak last night without issue.   Additionally, he is a history of GERD, which is generally well controlled with omeprazole and zantac. No prior EGD. Thinks he may have run out of Prilosec in spring and forgot to get more (is OTC), but no recent breakthough reflux sxs.   On ASA 81 mg and Pradaxa. Follows with Dr. Angelena Form and Dr. [name] with Cardiology.   Endoscopic Hx: - Colonoscopy (06/2008; Dr. Henrene Pastor): 5 mm inflammatory polyp in ascending colon, sigmoid diverticulosis. Recommended repeat in 5 years - Colonoscopies also completed in 1997, 2000, 2004, notable for TA  Past Medical History:  Diagnosis Date  . Age-related macular degeneration, dry, both eyes   . Allergic    "24/7; 365 days/year; I'm allergic to pollens, dust, all southern grasses/trees, mold, mildue, cats, dogs" (01/07/2017)  . Anal fissure   . Asthma    sees [name]   . Benign prostatic hypertrophy     (sees Dr. Denman George  . CAD (coronary artery disease)    a. s/p CABG 2007. b. Cath 08/2016 - 4/4 patent grafts.  . Carotid bruit    carotid u/s 10/10: 0.39% bilaterally  . Chronic combined systolic and diastolic CHF (congestive heart failure) ([place])   . Complication of anesthesia 1980s   "w/anal cyst OR, he gave me a saddle block then put a narcotic in spinal cord; had a severe reaction to that" (01/07/2017)  . Congestive heart failure (CHF) ([place])   . ED (erectile dysfunction)   . Family history of adverse reaction to anesthesia    "daughter wakes up during OR" (01/07/2017)  . GERD (gastroesophageal reflux disease)   . Gout   . HTN (hypertension)   . Hx of colonic polyps    (sees Dr. Henrene Pastor)  . Hyperlipidemia   . Hypothyroidism   . Moderate to severe aortic stenosis    a. s/p TAVR 02/2017.  [name] Myocardial infarction ([place]) ~ 2000  . Obesity   . Osteoarthritis    "was in my knees, hands" (01/07/2017 )  . PAF (paroxysmal atrial fibrillation) ([place])    a. documented post TAVR.  [name] Precancerous skin lesion    (sees Dr. Allyson Sabal)  . Prostate cancer ([place]) dx'd ~ 2014  . 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 ANUS SURGERY     "opened it back up cause it wouldn't heal; wound up w/a fissure" (01/07/2017)  . BIV ICD INSERTION CRT-D N/A 05/02/2017   Procedure: BIV ICD INSERTION CRT-D;  Surgeon: Deboraha Sprang, MD;  Location: [facility];  Service: Cardiovascular;  Laterality: N/A;  . CARDIAC CATHETERIZATION  10/29/2005  . CARDIAC CATHETERIZATION N/A 08/27/2016   Procedure: Right/Left Heart Cath and Coronary/Graft Angiography;  Surgeon: Burnell Blanks, MD;  Location: [facility];  Service: Cardiovascular;  Laterality: N/A;  . CATARACT EXTRACTION W/ INTRAOCULAR LENS  IMPLANT,  BILATERAL Bilateral   . CATARACT EXTRACTION, BILATERAL  2012  . COLONOSCOPY  06/30/2008   no repeats needed   . CORONARY ARTERY BYPASS GRAFT  2007   "CABG X4"  . CYST EXCISION PERINEAL  1980s  . HAMMER TOE SURGERY Bilateral   . JOINT REPLACEMENT    . KNEE ARTHROPLASTY  07/30/2011   Procedure: COMPUTER ASSISTED TOTAL KNEE ARTHROPLASTY;  Surgeon: Meredith Pel;  Location: [facility];  Service: Orthopedics;  Laterality: Left;  left total knee arthroplasty  . MASTECTOMY SUBCUTANEOUS Bilateral   . ORIF FINGER / THUMB FRACTURE Right ~ 1980   "repair of thumb injury"  . PROSTATE BIOPSY    . REPLACEMENT TOTAL KNEE BILATERAL Bilateral 2012  . TEE WITHOUT CARDIOVERSION N/A 03/04/2017   Procedure: TRANSESOPHAGEAL ECHOCARDIOGRAM (TEE);  Surgeon: Burnell Blanks, MD;  Location: [facility];  Service: Open Heart Surgery;  Laterality: N/A;  . TRANSCATHETER AORTIC VALVE REPLACEMENT, TRANSFEMORAL N/A 03/04/2017   Procedure: TRANSCATHETER AORTIC VALVE REPLACEMENT, TRANSFEMORAL;  Surgeon: Burnell Blanks, MD;  Location: [facility];  Service: Open Heart Surgery;  Laterality: N/A;   Family History  Problem Relation Age of Onset  . Heart attack Father 70  . Heart failure Mother 10  . Uterine cancer Mother   . Breast cancer Mother   . Colon cancer Neg Hx   . Esophageal cancer Neg Hx    Social History   Tobacco Use  . Smoking status: Former Smoker    Packs/day: 3.50    Years: 13.00    Pack years: 45.50    Types: Cigarettes    Last attempt to quit: 1963    Years since quitting: 56.7  . Smokeless tobacco: Never Used  Substance Use Topics  . Alcohol use: Yes    Alcohol/week: 0.0 standard drinks    Comment: 01/07/2017 "might have 2 beers 2-3 times/month"  . Drug use: No   Current Outpatient Medications  Medication Sig Dispense Refill  . aspirin 81 MG tablet Take 1 tablet (81 mg total) by mouth daily. 30 tablet 0  . atorvastatin (LIPITOR) 20 MG tablet TAKE 1 TABLET ONCE DAILY. 90 tablet 0  .  azithromycin (ZITHROMAX) 250 MG tablet Take as directed (Patient taking differently: Take 250 mg by mouth as needed. Take as directed) 6 tablet 1  . benzonatate (TESSALON) 200 MG capsule Take 1 capsule (200 mg total) by mouth every 8 (eight) hours as needed for cough. 45 capsule 1  . budesonide-formoterol (SYMBICORT) 80-4.5 MCG/ACT inhaler Inhale 2 puffs into the lungs 2 (two) times daily. 1 Inhaler 12  . chlorpheniramine (CHLOR-TRIMETON) 4 MG tablet Take 8 mg by mouth 2 (two) times daily.     . ciprofloxacin (CIPRO) 500 MG tablet Take 1 tablet (500 mg  total) by mouth 2 (two) times daily. (Patient taking differently: Take 500 mg by mouth as needed. ) 20 tablet 0  . dabigatran (PRADAXA) 150 MG CAPS capsule Take 1 capsule (150 mg total) by mouth 2 (two) times daily. 180 capsule 3  . DYMISTA 137-50 MCG/ACT SUSP USE 2 SPRAYS EACH NOSTRIL TWICE DAILY. 23 g 11  . furosemide (LASIX) 80 MG tablet Take 80 mg daily alternating with 80 mg twice daily. 135 tablet 3  . ipratropium (ATROVENT) 0.03 % nasal spray Place 2 sprays into both nostrils every 6 (six) hours as needed for rhinitis. 30 mL 4  . isosorbide mononitrate (IMDUR) 30 MG 24 hr tablet TAKE 1 TABLET ONCE DAILY. 90 tablet 0  . Ketotifen Fumarate (ZADITOR OP) Apply 1 drop to eye daily as needed (dry eyes).    [name] levothyroxine (SYNTHROID, LEVOTHROID) 75 MCG tablet Take 1 tablet (75 mcg total) by mouth daily. 180 tablet 3  . lidocaine (XYLOCAINE) 2 % solution Use as directed 10 mLs in the mouth or throat every 6 (six) hours as needed (sore throat). 100 mL 2  . losartan (COZAAR) 25 MG tablet Take 1 tablet (25 mg total) by mouth daily. 90 tablet 3  . metoprolol tartrate (LOPRESSOR) 25 MG tablet Take 25 mg by mouth 2 (two) times daily.    . montelukast (SINGULAIR) 10 MG tablet Take 1 tablet (10 mg total) by mouth every evening. 90 tablet 4  . Multiple Vitamins-Minerals (PRESERVISION AREDS PO) Take 2 tablets by mouth every morning.    [name] omeprazole (PRILOSEC)  20 MG capsule Take 1 capsule (20 mg total) by mouth daily. (Patient taking differently: Take 20 mg by mouth 2 (two) times daily. ) 90 capsule 4  . potassium chloride SA (K-DUR,KLOR-CON) 20 MEQ tablet Take 2 tablets by mouth twice daily alternating with 2 tablets daily. 270 tablet 3  . ranitidine (ZANTAC) 150 MG capsule Take 150 mg by mouth every evening.     . triamcinolone cream (KENALOG) 0.1 % Apply 1 application topically daily as needed for dry skin.    . valACYclovir (VALTREX) 500 MG tablet Take 1 tablet (500 mg total) by mouth 2 (two) times daily. 60 tablet 5   No current facility-administered medications for this visit.    Allergies  Allergen Reactions  . Peanut-Containing Drug Products Anaphylaxis  . Sulfonamide Derivatives Anaphylaxis  . Amlodipine Swelling    Swelling in ankles  . Eliquis [Apixaban] Other (See Comments)    Back/hip pain  . Lisinopril Cough    cough  . Xarelto [Rivaroxaban] Other (See Comments)    Back/hip pain     Review of Systems: All systems reviewed and negative except where noted in HPI.     Physical Exam:    Wt Readings from Last 3 Encounters:  05/25/18 256 lb 8 oz (116.3 kg)  05/21/18 256 lb (116.1 kg)  05/15/18 255 lb 2 oz (115.7 kg)    Ht 5\' 6"  (1.676 m)   Wt 256 lb 8 oz (116.3 kg)   BMI 41.40 kg/m  Constitutional:  Pleasant, in no acute distress. Psychiatric: Normal mood and affect. Behavior is normal. EENT: Pupils normal.  Conjunctivae are normal. No scleral icterus. Neck supple. No cervical LAD. Cardiovascular: Normal rate, regular rhythm. No edema Pulmonary/chest: Effort normal and breath sounds normal. No wheezing, rales or rhonchi. Abdominal: Soft, nondistended, nontender. Bowel sounds active throughout. There are no masses palpable. No hepatomegaly. Neurological: Alert and oriented to person place and time. Skin: Skin is  warm and dry. No rashes noted.   ASSESSMENT AND PLAN;   Justin [name] is a 84 y.o. male presenting  with new onset dysphagia along with a pre-existing history of GERD:  1) Dysphagia: Discussed the DDX for dysphagia to include esophageal stricture, stenosis, mass, motility disorder, etc. and will evaluate as below: - EGD with dilation - Resume PPI and H2RA - Advised patient to cut food into small pieces, eat small bites, chew food thoroughly and with plenty of liquids to avoid food impaction.  2) GERD: - Reflux sxs previously well controlled on current therapy.  We will not make any changes to therapy at this time, pending endoscopic evaluation for objective evidence of ongoing reflux (i.e. erosive esophagitis, Barrett's esophagus, etc.).  3) Chronic anticoagulation: - Hold Pradaxa 2 days before procedure - will instruct when and how to resume after procedure. Low but real risk of cardiovascular event such as heart attack, stroke, embolism, thrombosis or ischemia/infarct of other organs off Pradaxa explained and need to seek urgent help if this occurs. The patient consents to proceed. Will communicate by phone or EMR with patient's prescribing providers ( Cardiologists, Dr. Angelena Form and Dr. [name]) to confirm that holding Pradaxa is reasonable in this case.  Okay to resume ASA 81 mg through perioperative period.  The indications, risks, and benefits of EGD were explained to the patient in detail. Risks include but are not limited to bleeding, perforation, adverse reaction to medications, and cardiopulmonary compromise. Sequelae include but are not limited to the possibility of surgery, hositalization, and mortality. The patient verbalized understanding and wished to proceed. All questions answered, referred to scheduler. Further recommendations pending results of the exam.    Lavena Bullion, DO, FACG  05/25/2018, 8:52 AM   Laurey Morale, MD

## 2018-05-26 ENCOUNTER — Ambulatory Visit: Payer: Medicare Other | Admitting: Physician Assistant

## 2018-05-26 NOTE — Telephone Encounter (Signed)
Pt takes Pradaxa for afib with CHADS2VASc score of 5 (age x2, CHF, HTN, CAD). Renal function is normal. Ok to hold Pradaxa for 2 days prior to procedure.

## 2018-05-26 NOTE — Telephone Encounter (Signed)
Spoke to patient notified him to hold is Pradaxa for 2 days prior to procedure. Patient verbalized understanding of orders.

## 2018-05-26 NOTE — Telephone Encounter (Signed)
Pt return your call.

## 2018-05-27 ENCOUNTER — Encounter (HOSPITAL_COMMUNITY): Payer: Self-pay

## 2018-05-27 ENCOUNTER — Ambulatory Visit: Payer: Medicare Other | Admitting: Gastroenterology

## 2018-05-27 ENCOUNTER — Encounter (HOSPITAL_COMMUNITY)
Admission: RE | Admit: 2018-05-27 | Discharge: 2018-05-27 | Disposition: A | Discharge status: Home / Self Care | Payer: Self-pay | Source: Ambulatory Visit | Attending: Cardiovascular Disease | Admitting: Cardiovascular Disease

## 2018-05-27 DIAGNOSIS — Z952 Presence of prosthetic heart valve: Secondary | ICD-10-CM | POA: Insufficient documentation

## 2018-05-27 DIAGNOSIS — I350 Nonrheumatic aortic (valve) stenosis: Secondary | ICD-10-CM | POA: Insufficient documentation

## 2018-05-28 ENCOUNTER — Ambulatory Visit: Payer: Medicare Other | Admitting: Gastroenterology

## 2018-05-29 ENCOUNTER — Encounter (HOSPITAL_COMMUNITY): Payer: Self-pay

## 2018-05-29 ENCOUNTER — Encounter: Payer: Self-pay | Admitting: Gastroenterology

## 2018-05-29 ENCOUNTER — Ambulatory Visit (AMBULATORY_SURGERY_CENTER): Payer: Medicare Other | Admitting: Gastroenterology

## 2018-05-29 VITALS — BP 134/81 | HR 79 | Temp 99.3°F | Resp 11

## 2018-05-29 DIAGNOSIS — R1310 Dysphagia, unspecified: Secondary | ICD-10-CM | POA: Diagnosis not present

## 2018-05-29 DIAGNOSIS — B3781 Candidal esophagitis: Secondary | ICD-10-CM | POA: Diagnosis not present

## 2018-05-29 DIAGNOSIS — K209 Esophagitis, unspecified: Secondary | ICD-10-CM | POA: Diagnosis not present

## 2018-05-29 DIAGNOSIS — I10 Essential (primary) hypertension: Secondary | ICD-10-CM | POA: Diagnosis not present

## 2018-05-29 DIAGNOSIS — K219 Gastro-esophageal reflux disease without esophagitis: Secondary | ICD-10-CM

## 2018-05-29 MED ORDER — SODIUM CHLORIDE 0.9 % IV SOLN: 500.0000 mL | Freq: Once | INTRAVENOUS | Status: DC

## 2018-05-29 MED ORDER — FLUCONAZOLE 200 MG PO TABS: ORAL_TABLET | ORAL | 0 refills | Status: DC

## 2018-05-29 MED ORDER — DIFLUCAN 200 MG PO TABS: 200.0000 mg | ORAL_TABLET | Freq: Every day | ORAL | 0 refills | Status: AC

## 2018-05-29 NOTE — Progress Notes (Signed)
Called to room to assist during endoscopic procedure.  Patient ID and intended procedure confirmed with present staff. Received instructions for my participation in the procedure from the performing physician.  

## 2018-05-29 NOTE — Op Note (Signed)
[facility] Patient Name: Justin [name] Procedure Date: 05/29/2018 10:13 AM MRN: [id number] Endoscopist: Gerrit Heck , MD Age: 84 Referring MD:  Date of Birth: Jan 30, 1934 Gender: Male Account #: [id number] Procedure:                Upper GI endoscopy Indications:              Dysphagia, Gastro-esophageal reflux disease, Globus                            sensation                           84 yo male with recent onset dysphagia and globus                            sensation, pointing to suprasternal notch. He has a                            history of reflux that had otherwise been                            previously controlled with acid suppression therapy. Medicines:                Monitored Anesthesia Care Procedure:                Pre-Anesthesia Assessment:                           - Prior to the procedure, a History and Physical                            was performed, and patient medications and                            allergies were reviewed. The patient's tolerance of                            previous anesthesia was also reviewed. The risks                            and benefits of the procedure and the sedation                            options and risks were discussed with the patient.                            All questions were answered, and informed consent                            was obtained. Prior Anticoagulants: The patient has                            taken Pradaxa (dabigatran), last dose was 2 days  prior to procedure. Aspirin 81 mg last taken                            yesterday morning. ASA Grade Assessment: III - A                            patient with severe systemic disease. After                            reviewing the risks and benefits, the patient was                            deemed in satisfactory condition to undergo the                            procedure.                           After  obtaining informed consent, the endoscope was                            passed under direct vision. Throughout the                            procedure, the patient's blood pressure, pulse, and                            oxygen saturations were monitored continuously. The                            Model GIF-HQ190 [id number]) scope was introduced                            through the mouth, and advanced to the second part                            of duodenum. The upper GI endoscopy was                            accomplished without difficulty. The patient                            tolerated the procedure well. Scope In: Scope Out: Findings:                 Diffuse, white plaques were found in the entire                            esophagus. Biopsies were taken with a cold forceps                            for histology. Estimated blood loss was minimal.                           The Z-line was  regular and was found 40 cm from the                            incisors.                           The gastroesophageal flap valve was visualized                            endoscopically and classified as Hill Grade II                            (fold present, opens with respiration).                           The entire examined stomach was normal.                           The duodenal bulb, first portion of the duodenum                            and second portion of the duodenum were normal. Complications:            No immediate complications. Estimated Blood Loss:     Estimated blood loss was minimal. Impression:               - Esophageal plaques were found, suspicious for                            candidiasis. Biopsied.                           - Z-line regular, 40 cm from the incisors.                           - The esophagus was otherwise without luminal                            narrowing, stricture, or rings. No dilation                            performed.                            - Gastroesophageal flap valve classified as Hill                            Grade II (fold present, opens with respiration).                           - Normal stomach.                           - Normal duodenal bulb, first portion of the                            duodenum  and second portion of the duodenum. Recommendation:           - Patient has a contact number available for                            emergencies. The signs and symptoms of potential                            delayed complications were discussed with the                            patient. Return to normal activities tomorrow.                            Written discharge instructions were provided to the                            patient.                           - Resume previous diet today.                           - Continue present medications.                           - Await pathology results.                           - Diflucan (fluconazole) 400 mg PO x1 then 200 mg                            PO daily for 20 days.                           - Ensure to drink water/fluids after using inhaler.                           - Return to GI clinic PRN. Gerrit Heck, MD 05/29/2018 10:38:08 AM

## 2018-05-29 NOTE — Patient Instructions (Signed)
Diflucan ordered for you to pick up at [facility]  Take Diflucan 400 mg 1st day then take 200 mg once daily for next 21 days .   Be sure to drink water /fluids after inhaler    Await biopsy results     YOU HAD AN ENDOSCOPIC PROCEDURE TODAY AT [facility]:   Refer to the procedure report that was given to you for any specific questions about what was found during the examination.  If the procedure report does not answer your questions, please call your gastroenterologist to clarify.  If you requested that your care partner not be given the details of your procedure findings, then the procedure report has been included in a sealed envelope for you to review at your convenience later.  YOU SHOULD EXPECT: Some feelings of bloating in the abdomen. Passage of more gas than usual.  Walking can help get rid of the air that was put into your GI tract during the procedure and reduce the bloating. If you had a lower endoscopy (such as a colonoscopy or flexible sigmoidoscopy) you may notice spotting of blood in your stool or on the toilet paper. If you underwent a bowel prep for your procedure, you may not have a normal bowel movement for a few days.  Please Note:  You might notice some irritation and congestion in your nose or some drainage.  This is from the oxygen used during your procedure.  There is no need for concern and it should clear up in a day or so.  SYMPTOMS TO REPORT IMMEDIATELY:     Following upper endoscopy (EGD)  Vomiting of blood or coffee ground material  New chest pain or pain under the shoulder blades  Painful or persistently difficult swallowing  New shortness of breath  Fever of 100F or higher  Black, tarry-looking stools  For urgent or emergent issues, a gastroenterologist can be reached at any hour by calling [phone].   DIET:  We do recommend a small meal at first, but then you may proceed to your regular diet.  Drink plenty of  fluids but you should avoid alcoholic beverages for 24 hours.  ACTIVITY:  You should plan to take it easy for the rest of today and you should NOT DRIVE or use heavy machinery until tomorrow (because of the sedation medicines used during the test).    FOLLOW UP: Our staff will call the number listed on your records the next business day following your procedure to check on you and address any questions or concerns that you may have regarding the information given to you following your procedure. If we do not reach you, we will leave a message.  However, if you are feeling well and you are not experiencing any problems, there is no need to return our call.  We will assume that you have returned to your regular daily activities without incident.  If any biopsies were taken you will be contacted by phone or by letter within the next 1-3 weeks.  Please call us at [phone] if you have not heard about the biopsies in 3 weeks.    SIGNATURES/CONFIDENTIALITY: You and/or your care partner have signed paperwork which will be entered into your electronic medical record.  These signatures attest to the fact that that the information above on your After Visit Summary has been reviewed and is understood.  Full responsibility of the confidentiality of this discharge information lies with you and/or your care-partner.

## 2018-05-29 NOTE — Progress Notes (Signed)
Pt's states no medical or surgical changes since previsit or office visit. 

## 2018-05-29 NOTE — Progress Notes (Signed)
Report to PACU, RN, vss, BBS= Clear.  

## 2018-06-01 ENCOUNTER — Encounter (HOSPITAL_COMMUNITY): Payer: Self-pay

## 2018-06-01 ENCOUNTER — Other Ambulatory Visit: Payer: Self-pay | Admitting: Pharmacist

## 2018-06-01 ENCOUNTER — Telehealth: Payer: Self-pay | Admitting: *Deleted

## 2018-06-01 ENCOUNTER — Encounter (HOSPITAL_COMMUNITY)
Admission: RE | Admit: 2018-06-01 | Discharge: 2018-06-01 | Disposition: A | Discharge status: Home / Self Care | Payer: Self-pay | Source: Ambulatory Visit | Attending: Cardiovascular Disease | Admitting: Cardiovascular Disease

## 2018-06-01 NOTE — Telephone Encounter (Signed)
  Follow up Call-  Call back number 05/29/2018  Post procedure Call Back phone  # [phone]  Permission to leave phone message Yes  Some recent data might be hidden     Patient questions:  Do you have a fever, pain , or abdominal swelling? No. Pain Score  0 *  Have you tolerated food without any problems? Yes.    Have you been able to return to your normal activities? Yes.    Do you have any questions about your discharge instructions: Diet   No. Medications  No. Follow up visit  No.  Do you have questions or concerns about your Care? No.  Actions: * If pain score is 4 or above: No action needed, pain <4.

## 2018-06-01 NOTE — Patient Outreach (Signed)
[facility]) Care Management  06/01/2018  Justin [name] 01-Mar-1934 [id number]   Incoming call from [name] in response to the [facility] Medication Adherence Campaign. Speak with patient. HIPAA identifiers verified and verbal consent received.  [name] reports that he takes his losartan once daily. Denies any missed doses or barriers to taking his medication. Reports that earlier in the year he was traveling for several months and that his refills were irregular at that time, but that he was consistent with taking his medication. Reports that he uses a weekly pillbox to organize his medications. Reports that he checks his blood pressure at home and that it is currently being checked three times/week at his Cardiac Rehab Maintenance appointments.   Denies any further medication questions/concerns at this time. Will close pharmacy episode.  Harlow Asa, PharmD, [facility] [phone]

## 2018-06-03 ENCOUNTER — Encounter (HOSPITAL_COMMUNITY)
Admission: RE | Admit: 2018-06-03 | Discharge: 2018-06-03 | Disposition: A | Discharge status: Home / Self Care | Payer: Self-pay | Source: Ambulatory Visit | Attending: Cardiovascular Disease | Admitting: Cardiovascular Disease

## 2018-06-03 ENCOUNTER — Encounter (HOSPITAL_COMMUNITY): Payer: Self-pay

## 2018-06-05 ENCOUNTER — Encounter (HOSPITAL_COMMUNITY): Payer: Self-pay

## 2018-06-08 ENCOUNTER — Encounter (HOSPITAL_COMMUNITY): Payer: Self-pay

## 2018-06-09 ENCOUNTER — Ambulatory Visit: Payer: Medicare Other | Admitting: Internal Medicine

## 2018-06-09 ENCOUNTER — Encounter: Payer: Self-pay | Admitting: Internal Medicine

## 2018-06-09 VITALS — BP 122/60 | HR 94 | Ht 66.0 in | Wt 256.2 lb

## 2018-06-09 DIAGNOSIS — I255 Ischemic cardiomyopathy: Secondary | ICD-10-CM | POA: Diagnosis not present

## 2018-06-09 DIAGNOSIS — I472 Ventricular tachycardia, unspecified: Secondary | ICD-10-CM

## 2018-06-09 DIAGNOSIS — I493 Ventricular premature depolarization: Secondary | ICD-10-CM

## 2018-06-09 DIAGNOSIS — Z95810 Presence of automatic (implantable) cardiac defibrillator: Secondary | ICD-10-CM

## 2018-06-09 DIAGNOSIS — I5020 Unspecified systolic (congestive) heart failure: Secondary | ICD-10-CM

## 2018-06-09 DIAGNOSIS — I428 Other cardiomyopathies: Secondary | ICD-10-CM | POA: Diagnosis not present

## 2018-06-09 DIAGNOSIS — Z952 Presence of prosthetic heart valve: Secondary | ICD-10-CM

## 2018-06-09 NOTE — Patient Instructions (Addendum)
Medication Instructions:  Your physician recommends that you continue on your current medications as directed. Please refer to the Current Medication list given to you today. If you need a refill on your cardiac medications before your next appointment, please call your pharmacy.   Lab work: None ordered. If you have labs (blood work) drawn today and your tests are completely normal, you will receive your results only by: [name] MyChart Message (if you have MyChart) OR . A paper copy in the mail If you have any lab test that is abnormal or we need to change your treatment, we will call you to review the results.  Testing/Procedures: None ordered.  Follow-Up: At [facility], you and your health needs are our priority.  As part of our continuing mission to provide you with exceptional heart care, we have created designated Provider Care Teams.  These Care Teams include your primary Cardiologist (physician) and Advanced Practice Providers (APPs -  Physician Assistants and Nurse Practitioners) who all work together to provide you with the care you need, when you need it. You will need a follow up appointment in 12 months.  Please call our office 2 months in advance to schedule this appointment.  You may see Dr [name] or one of the following Advanced Practice Providers on your designated Care Team:   Chanetta Marshall, NP . Tommye Standard, PA-C  Follow up with Dr Julianne Handler as scheduled.  Remote monitoring is used to monitor your Pacemaker of ICD from home. This monitoring reduces the number of office visits required to check your device to one time per year. It allows [place] to keep an eye on the functioning of your device to ensure it is working properly. You are scheduled for a device check from home on 07/07/2018. You may send your transmission at any time that day. If you have a wireless device, the transmission will be sent automatically. After your physician reviews your transmission, you will receive a  postcard with your next transmission date.   Any Other Special Instructions Will Be Listed Below (If Applicable).

## 2018-06-09 NOTE — Progress Notes (Signed)
He is not having infrequent     Patient Care Team: Laurey Morale, MD as PCP - Cephus Richer, MD as Consulting Physician (Urology)   HPI  Justin [name] is a 84 y.o. male Seen with a chief complaint of an ICD implanted because of syncope  and new left bundle branch block following TAVR    Much improved with CRT  Denies chest pain sob   Some edema  Great trip around the world     1/18 Cath >>SVG patent   Prior CABG 2007 whenever he is very good  DATE TEST EF   8/18 Echo   40-45 %   6/19 Echo   40-45 %            Records and Results Reviewed  Past Medical History:  Diagnosis Date  . Age-related macular degeneration, dry, both eyes   . Allergic    "24/7; 365 days/year; I'm allergic to pollens, dust, all southern grasses/trees, mold, mildue, cats, dogs" (01/07/2017)  . Anal fissure   . Asthma    sees [name]   . Benign prostatic hypertrophy    (sees Dr. Denman George  . CAD (coronary artery disease)    a. s/p CABG 2007. b. Cath 08/2016 - 4/4 patent grafts.  . Carotid bruit    carotid u/s 10/10: 0.39% bilaterally  . Chronic combined systolic and diastolic CHF (congestive heart failure) ([place])   . Complication of anesthesia 1980s   "w/anal cyst OR, he gave me a saddle block then put a narcotic in spinal cord; had a severe reaction to that" (01/07/2017)  . Congestive heart failure (CHF) ([place])   . ED (erectile dysfunction)   . Family history of adverse reaction to anesthesia    "daughter wakes up during OR" (01/07/2017)  . GERD (gastroesophageal reflux disease)   . Gout   . HTN (hypertension)   . Hx of colonic polyps    (sees Dr. Henrene Pastor)  . Hyperlipidemia   . Hypothyroidism   . Moderate to severe aortic stenosis    a. s/p TAVR 02/2017.  [name] Myocardial infarction ([place]) ~ 2000  . Obesity   . Osteoarthritis    "was in my knees, hands" (01/07/2017 )  . PAF (paroxysmal atrial fibrillation) ([place])    a. documented post TAVR.  [name] Precancerous skin lesion    (sees Dr. Allyson Sabal)  . Prostate cancer ([place]) dx'd ~ 2014  . 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 ANUS SURGERY     "opened it back up cause it wouldn't heal; wound up w/a fissure" (01/07/2017)  . BIV ICD INSERTION CRT-D N/A 05/02/2017   Procedure: BIV ICD INSERTION CRT-D;  Surgeon: Deboraha Sprang, MD;  Location: [facility];  Service: Cardiovascular;  Laterality: N/A;  . CARDIAC CATHETERIZATION  10/29/2005  . CARDIAC CATHETERIZATION N/A 08/27/2016   Procedure: Right/Left Heart Cath and Coronary/Graft Angiography;  Surgeon: Burnell Blanks, MD;  Location: [facility];  Service: Cardiovascular;  Laterality: N/A;  . CATARACT EXTRACTION W/ INTRAOCULAR LENS  IMPLANT, BILATERAL Bilateral   . CATARACT EXTRACTION, BILATERAL  2012  . COLONOSCOPY  06/30/2008   no repeats needed   . COLONOSCOPY     had 3 or 4 in the past   . CORONARY ARTERY BYPASS GRAFT  2007   "CABG X4"  . CYST EXCISION PERINEAL  1980s  . HAMMER TOE SURGERY Bilateral   . JOINT REPLACEMENT    . KNEE ARTHROPLASTY  07/30/2011   Procedure: COMPUTER ASSISTED TOTAL KNEE ARTHROPLASTY;  Surgeon: Meredith Pel;  Location: [facility];  Service: Orthopedics;  Laterality: Left;  left total knee arthroplasty  . MASTECTOMY SUBCUTANEOUS Bilateral   . ORIF FINGER / THUMB FRACTURE Right ~ 1980   "repair of thumb injury"  . PROSTATE BIOPSY    . REPLACEMENT TOTAL KNEE BILATERAL Bilateral 2012  . TEE WITHOUT CARDIOVERSION N/A 03/04/2017   Procedure: TRANSESOPHAGEAL ECHOCARDIOGRAM (TEE);  Surgeon: Burnell Blanks, MD;  Location: [facility];  Service: Open Heart Surgery;  Laterality: N/A;  . TRANSCATHETER AORTIC VALVE REPLACEMENT, TRANSFEMORAL N/A 03/04/2017   Procedure: TRANSCATHETER AORTIC VALVE REPLACEMENT, TRANSFEMORAL;   Surgeon: Burnell Blanks, MD;  Location: [facility];  Service: Open Heart Surgery;  Laterality: N/A;    Current Outpatient Medications  Medication Sig Dispense Refill  . aspirin 81 MG tablet Take 1 tablet (81 mg total) by mouth daily. 30 tablet 0  . atorvastatin (LIPITOR) 20 MG tablet TAKE 1 TABLET ONCE DAILY. 90 tablet 0  . azithromycin (ZITHROMAX) 250 MG tablet Take as directed (Patient taking differently: Take 250 mg by mouth as needed. Take as directed) 6 tablet 1  . benzonatate (TESSALON) 200 MG capsule Take 1 capsule (200 mg total) by mouth every 8 (eight) hours as needed for cough. 45 capsule 1  . budesonide-formoterol (SYMBICORT) 80-4.5 MCG/ACT inhaler Inhale 2 puffs into the lungs 2 (two) times daily. 1 Inhaler 12  . chlorpheniramine (CHLOR-TRIMETON) 4 MG tablet Take 8 mg by mouth 2 (two) times daily.     . ciprofloxacin (CIPRO) 500 MG tablet Take 1 tablet (500 mg total) by mouth 2 (two) times daily. (Patient taking differently: Take 500 mg by mouth as needed. ) 20 tablet 0  . dabigatran (PRADAXA) 150 MG CAPS capsule Take 1 capsule (150 mg total) by mouth 2 (two) times daily. 180 capsule 3  . DIFLUCAN 200 MG tablet Take 1 tablet (200 mg total) by mouth daily for 21 days. 21 tablet 0  . DYMISTA 137-50 MCG/ACT SUSP USE 2 SPRAYS EACH NOSTRIL TWICE DAILY. 23 g 11  . furosemide (LASIX) 80 MG tablet Take 80 mg daily alternating with 80 mg twice daily. 135 tablet 3  . ipratropium (ATROVENT) 0.03 % nasal spray Place 2 sprays into both nostrils every 6 (six) hours as needed for rhinitis. 30 mL 4  . isosorbide mononitrate (IMDUR) 30 MG 24 hr tablet TAKE 1 TABLET ONCE DAILY. 90 tablet 0  . Ketotifen Fumarate (ZADITOR OP) Apply 1 drop to eye daily as needed (dry eyes).    [name] levothyroxine (SYNTHROID, LEVOTHROID) 75 MCG tablet Take 1 tablet (75 mcg total) by mouth daily. 180 tablet 3  . lidocaine (XYLOCAINE) 2 % solution Use as directed 10 mLs in the mouth or throat every 6 (six) hours as needed  (sore throat). 100 mL 2  . losartan (COZAAR) 25 MG tablet Take 1 tablet (25 mg total) by mouth daily. 90 tablet 3  . metoprolol tartrate (LOPRESSOR) 25 MG tablet Take 25 mg by mouth 2 (two) times daily.    . Multiple Vitamins-Minerals (PRESERVISION AREDS PO) Take 2 tablets by mouth every morning.    [name] omeprazole (PRILOSEC) 20 MG capsule Take 1 capsule (20 mg total) by mouth daily. 90 capsule 4  . Potassium Chloride ER 20 MEQ  TBCR Take 20 mEq by mouth daily.    . potassium chloride SA (K-DUR,KLOR-CON) 20 MEQ tablet Take 2 tablets by mouth twice daily alternating with 2 tablets daily. 270 tablet 3  . ranitidine (ZANTAC) 150 MG capsule Take 150 mg by mouth every evening.     . triamcinolone cream (KENALOG) 0.1 % Apply 1 application topically daily as needed for dry skin.    . valACYclovir (VALTREX) 500 MG tablet Take 1 tablet (500 mg total) by mouth 2 (two) times daily. 60 tablet 5   No current facility-administered medications for this visit.     Allergies  Allergen Reactions  . Peanut-Containing Drug Products Anaphylaxis  . Sulfonamide Derivatives Anaphylaxis  . Amlodipine Swelling    Swelling in ankles  . Eliquis [Apixaban] Other (See Comments)    Back/hip pain  . Lisinopril Cough    cough  . Xarelto [Rivaroxaban] Other (See Comments)    Back/hip pain      Review of Systems negative except from HPI and PMH  Physical Exam BP 122/60   Pulse 94   Ht 5\' 6"  (1.676 m)   Wt 256 lb 3.2 oz (116.2 kg)   SpO2 97%   BMI 41.35 kg/m    Well developed and nourished in no acute distress HENT normal Neck supple with JVP-flat Clear Device pocket well healed; without hematoma or erythema.  There is no tethering  Regular rate and rhythm, no murmurs or gallops Abd-soft with active BS No Clubbing cyanosis edema Skin-warm and dry A & Oriented  Grossly normal sensory and motor function\    ECG to assess biventricular pacing function demonstrates P synchronous pacing with an upright QRS  in lead V1 negative QRS in lead I infrequent ectopic beats with a right bundle branch block morphology  Assessment and  Plan  Ventricular tachycardia- prolonged- nonsustained  Sinus node dysfunction  Junctional rhythm  Post TAVR left bundle branch block/IVCD  Cardiomyopathy EF 40-45%  CRT- D Medtronic  The patient's device was interrogated.  The information was reviewed. No changes were made in the programming.     PVCs-frequent   VT nonsustained  Euvolemic continue current meds  Continue current meds   Current medicines are reviewed at length with the patient today .  The patient does not  have concerns regarding medicines.

## 2018-06-10 ENCOUNTER — Encounter (HOSPITAL_COMMUNITY)
Admission: RE | Admit: 2018-06-10 | Discharge: 2018-06-10 | Disposition: A | Discharge status: Home / Self Care | Payer: Medicare Other | Source: Ambulatory Visit | Attending: Cardiovascular Disease | Admitting: Cardiovascular Disease

## 2018-06-10 ENCOUNTER — Encounter (HOSPITAL_COMMUNITY): Payer: Self-pay

## 2018-06-11 ENCOUNTER — Other Ambulatory Visit: Payer: Self-pay | Admitting: Cardiovascular Disease

## 2018-06-11 NOTE — Telephone Encounter (Signed)
Pradaxa 150mg  refill request received; pt is 84 yrs old, wt-116.2kg, Crea-0.73 on 12/30/17, last seen by Dr. [name] on 06/09/18, CrCl-130.65ml/min; will send in refill to requested pharmacy.

## 2018-06-12 ENCOUNTER — Encounter (HOSPITAL_COMMUNITY): Payer: Self-pay

## 2018-06-12 ENCOUNTER — Encounter (HOSPITAL_COMMUNITY)
Admission: RE | Admit: 2018-06-12 | Discharge: 2018-06-12 | Disposition: A | Discharge status: Home / Self Care | Payer: Medicare Other | Source: Ambulatory Visit | Attending: Cardiovascular Disease | Admitting: Cardiovascular Disease

## 2018-06-15 ENCOUNTER — Encounter (HOSPITAL_COMMUNITY): Payer: Self-pay

## 2018-06-15 LAB — CUP PACEART INCLINIC DEVICE CHECK
Date Time Interrogation Session: 20191021082807
Implantable Lead Implant Date: 20180907
Implantable Lead Implant Date: 20180907
Implantable Lead Location: 753858
Implantable Pulse Generator Implant Date: 20180907
MDC IDC LEAD IMPLANT DT: 20180907
MDC IDC LEAD LOCATION: 753859
MDC IDC LEAD LOCATION: 753860

## 2018-06-17 ENCOUNTER — Encounter (HOSPITAL_COMMUNITY): Payer: Self-pay

## 2018-06-17 ENCOUNTER — Encounter (HOSPITAL_COMMUNITY)
Admission: RE | Admit: 2018-06-17 | Discharge: 2018-06-17 | Disposition: A | Discharge status: Home / Self Care | Payer: Self-pay | Source: Ambulatory Visit | Attending: Cardiovascular Disease | Admitting: Cardiovascular Disease

## 2018-06-19 ENCOUNTER — Encounter (HOSPITAL_COMMUNITY): Payer: Self-pay

## 2018-06-19 ENCOUNTER — Encounter: Payer: Self-pay | Admitting: Family Medicine

## 2018-06-19 DIAGNOSIS — L819 Disorder of pigmentation, unspecified: Secondary | ICD-10-CM | POA: Diagnosis not present

## 2018-06-19 DIAGNOSIS — L814 Other melanin hyperpigmentation: Secondary | ICD-10-CM | POA: Diagnosis not present

## 2018-06-19 DIAGNOSIS — L821 Other seborrheic keratosis: Secondary | ICD-10-CM | POA: Diagnosis not present

## 2018-06-19 DIAGNOSIS — D485 Neoplasm of uncertain behavior of skin: Secondary | ICD-10-CM | POA: Diagnosis not present

## 2018-06-19 DIAGNOSIS — L570 Actinic keratosis: Secondary | ICD-10-CM | POA: Diagnosis not present

## 2018-06-19 DIAGNOSIS — D229 Melanocytic nevi, unspecified: Secondary | ICD-10-CM | POA: Diagnosis not present

## 2018-06-22 ENCOUNTER — Encounter (HOSPITAL_COMMUNITY): Payer: Self-pay

## 2018-06-22 ENCOUNTER — Encounter (HOSPITAL_COMMUNITY)
Admission: RE | Admit: 2018-06-22 | Discharge: 2018-06-22 | Disposition: A | Discharge status: Home / Self Care | Payer: Self-pay | Source: Ambulatory Visit | Attending: Cardiovascular Disease | Admitting: Cardiovascular Disease

## 2018-06-24 ENCOUNTER — Encounter (HOSPITAL_COMMUNITY)
Admission: RE | Admit: 2018-06-24 | Discharge: 2018-06-24 | Disposition: A | Discharge status: Home / Self Care | Payer: Self-pay | Source: Ambulatory Visit | Attending: Cardiovascular Disease | Admitting: Cardiovascular Disease

## 2018-06-24 ENCOUNTER — Encounter (HOSPITAL_COMMUNITY): Payer: Self-pay

## 2018-06-26 ENCOUNTER — Encounter (HOSPITAL_COMMUNITY): Payer: Self-pay

## 2018-06-26 ENCOUNTER — Other Ambulatory Visit: Payer: Self-pay | Admitting: Pulmonary Disease

## 2018-06-26 DIAGNOSIS — Z952 Presence of prosthetic heart valve: Secondary | ICD-10-CM | POA: Insufficient documentation

## 2018-06-26 DIAGNOSIS — I350 Nonrheumatic aortic (valve) stenosis: Secondary | ICD-10-CM | POA: Insufficient documentation

## 2018-06-28 ENCOUNTER — Other Ambulatory Visit: Payer: Self-pay | Admitting: Pulmonary Disease

## 2018-06-29 ENCOUNTER — Encounter (HOSPITAL_COMMUNITY)
Admission: RE | Admit: 2018-06-29 | Discharge: 2018-06-29 | Disposition: A | Discharge status: Home / Self Care | Payer: Self-pay | Source: Ambulatory Visit | Attending: Cardiovascular Disease | Admitting: Cardiovascular Disease

## 2018-06-29 ENCOUNTER — Encounter (HOSPITAL_COMMUNITY): Payer: Self-pay

## 2018-06-29 DIAGNOSIS — H353133 Nonexudative age-related macular degeneration, bilateral, advanced atrophic without subfoveal involvement: Secondary | ICD-10-CM | POA: Diagnosis not present

## 2018-06-29 DIAGNOSIS — H524 Presbyopia: Secondary | ICD-10-CM | POA: Diagnosis not present

## 2018-07-01 ENCOUNTER — Encounter (HOSPITAL_COMMUNITY)
Admission: RE | Admit: 2018-07-01 | Discharge: 2018-07-01 | Disposition: A | Discharge status: Home / Self Care | Payer: Self-pay | Source: Ambulatory Visit | Attending: Cardiovascular Disease | Admitting: Cardiovascular Disease

## 2018-07-01 ENCOUNTER — Ambulatory Visit: Payer: Medicare Other | Attending: Ophthalmology | Admitting: Occupational Therapy

## 2018-07-01 ENCOUNTER — Encounter (HOSPITAL_COMMUNITY): Payer: Self-pay

## 2018-07-01 DIAGNOSIS — H53413 Scotoma involving central area, bilateral: Secondary | ICD-10-CM | POA: Insufficient documentation

## 2018-07-01 NOTE — Therapy (Signed)
[facility] [address] [place], [place], [address] Phone: [phone]   Fax:  [phone]  Occupational Therapy Evaluation  Patient Details  Name: Justin [name] MRN: [id number] Date of Birth: 07-09-1934 Referring Provider (OT): Dr Ellie Lunch   Encounter Date: 07/01/2018  OT End of Session - 07/01/18 1323    Visit Number  1    Authorization Type  Medicare    OT Start Time  1320    OT Stop Time  1415    OT Time Calculation (min)  55 min    Activity Tolerance  Patient tolerated treatment well    Behavior During Therapy  [facility] for tasks assessed/performed       Past Medical History:  Diagnosis Date  . Age-related macular degeneration, dry, both eyes   . Allergic    "24/7; 365 days/year; I'm allergic to pollens, dust, all southern grasses/trees, mold, mildue, cats, dogs" (01/07/2017)  . Anal fissure   . Asthma    sees [name]   . Benign prostatic hypertrophy    (sees Dr. Denman George  . CAD (coronary artery disease)    a. s/p CABG 2007. b. Cath 08/2016 - 4/4 patent grafts.  . Carotid bruit    carotid u/s 10/10: 0.39% bilaterally  . Chronic combined systolic and diastolic CHF (congestive heart failure) ([place])   . Complication of anesthesia 1980s   "w/anal cyst OR, he gave me a saddle block then put a narcotic in spinal cord; had a severe reaction to that" (01/07/2017)  . Congestive heart failure (CHF) ([place])   . ED (erectile dysfunction)   . Family history of adverse reaction to anesthesia    "daughter wakes up during OR" (01/07/2017)  . GERD (gastroesophageal reflux disease)   . Gout   . HTN (hypertension)   . Hx of colonic polyps    (sees Dr. Henrene Pastor)  . Hyperlipidemia   . Hypothyroidism   . Moderate to severe aortic stenosis    a. s/p TAVR 02/2017.  [name] Myocardial infarction ([place]) ~ 2000  . Obesity   . Osteoarthritis    "was in my knees, hands" (01/07/2017 )  . PAF (paroxysmal atrial fibrillation) ([place])    a.  documented post TAVR.  [name] Precancerous skin lesion    (sees Dr. Allyson Sabal)  . Prostate cancer ([place]) dx'd ~ 2014  . 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 ANUS SURGERY     "opened it back up cause it wouldn't heal; wound up w/a fissure" (01/07/2017)  . BIV ICD INSERTION CRT-D N/A 05/02/2017   Procedure: BIV ICD INSERTION CRT-D;  Surgeon: Deboraha Sprang, MD;  Location: [facility];  Service: Cardiovascular;  Laterality: N/A;  . CARDIAC CATHETERIZATION  10/29/2005  . CARDIAC CATHETERIZATION N/A 08/27/2016   Procedure: Right/Left Heart Cath and Coronary/Graft Angiography;  Surgeon: Burnell Blanks, MD;  Location: [facility];  Service: Cardiovascular;  Laterality: N/A;  . CATARACT EXTRACTION W/ INTRAOCULAR LENS  IMPLANT, BILATERAL Bilateral   . CATARACT EXTRACTION, BILATERAL  2012  . COLONOSCOPY  06/30/2008   no repeats needed   . COLONOSCOPY     had 3 or 4 in the past   . CORONARY ARTERY BYPASS GRAFT  2007   "CABG X4"  . CYST EXCISION PERINEAL  64s  . HAMMER TOE SURGERY Bilateral   . JOINT REPLACEMENT    . KNEE ARTHROPLASTY  07/30/2011   Procedure: COMPUTER ASSISTED TOTAL KNEE ARTHROPLASTY;  Surgeon: Meredith Pel;  Location: [facility];  Service: Orthopedics;  Laterality: Left;  left total knee arthroplasty  . MASTECTOMY SUBCUTANEOUS Bilateral   . ORIF FINGER / THUMB FRACTURE Right ~ 1980   "repair of thumb injury"  . PROSTATE BIOPSY    . REPLACEMENT TOTAL KNEE BILATERAL Bilateral 2012  . TEE WITHOUT CARDIOVERSION N/A 03/04/2017   Procedure: TRANSESOPHAGEAL ECHOCARDIOGRAM (TEE);  Surgeon: Burnell Blanks, MD;  Location: [facility];  Service: Open Heart Surgery;  Laterality: N/A;  . TRANSCATHETER AORTIC VALVE REPLACEMENT, TRANSFEMORAL N/A 03/04/2017   Procedure:  TRANSCATHETER AORTIC VALVE REPLACEMENT, TRANSFEMORAL;  Surgeon: Burnell Blanks, MD;  Location: [facility];  Service: Open Heart Surgery;  Laterality: N/A;    There were no vitals filed for this visit.  Subjective Assessment - 07/01/18 1523    Subjective   Pt reports a decline in vision since he was last seen    Pertinent History  see snapshot    Patient Stated Goals  to read easier    Currently in Pain?  No/denies        [facility] OT Assessment - 07/01/18 1324      Assessment   Medical Diagnosis  macular degeneration    Referring Provider (OT)  Dr Ellie Lunch    Onset Date/Surgical Date  05/19/18      Precautions   Precaution Comments  visual impairment      Balance Screen   Has the patient fallen in the past 6 months  No    Has the patient had a decrease in activity level because of a fear of falling?   No    Is the patient reluctant to leave their home because of a fear of falling?   No      Home  Environment   Family/patient expects to be discharged to:  Private residence    Lives With  Spouse      Prior Function   Level of Independence  Independent      ADL   ADL comments  Modified independent with all basic       IADL   Shopping  Takes care of all shopping needs independently    Medication Management  Is responsible for taking medication in correct dosages at correct time    Financial Management  Requires assistance;Manages financial matters independently (budgets, writes checks, pays rent, bills goes to bank), collects and keeps track of income   has someone double check business activities      Mobility   Mobility Status  [place] tested    Per MD/OD Report  OD 20/100, OS 20/60-2    Reading Acuity  (0.5)    Patient has diffculty with activities due to visual impairment  Writing checks   has assistance for work bills and finances   Comment  Pt reports 2 lines of blurry vision in right eye due to tears in  retina      Cognition   Overall Cognitive Status  Within Functional Limits for tasks assessed    [address] Exam   28/30            Pt was provided with education regarding various magnifiers, see below. Pt demonstrates ability to read continuous text following education.  Therapist also recommended use of check writing line guide. Pt was provided with information regarding CCTV's. Pt reports he is borrowing a device from his MD office.  Pt was also given contact info for [employer] assistive technology for information regarding voice dictation software. Pt was provided with info regarding CCTV's.          OT Education - 07/01/18 1514    Education Details  Pt was educated regarding the Following: contact info for [employer] assistive technology, use of handheld electronic video magnifier, 3x stand magnifier, hands free gooseneck magnifier with light, use of sharpie for writing,     Person(s) Educated  Patient;Spouse;Child(ren)    Methods  Explanation;Demonstration;Verbal cues;Handout    Comprehension  Verbalized understanding;Returned demonstration                 Plan - 07/01/18 1458    Clinical Impression Statement  Pt is an 84 y.o male with diagnosis of macular degeneration who presents to occupational therapy wih visual deficits which impede performance of reading ability, and ADLs/ IADLS. Pt was seen for an occupational therapy evaluation and treatment on day of eval. Pt demonstrates good understanding of all education and therefore additional visits are not recommended.    Occupational Profile and client history currently impacting functional performance  currently I with ADLs, working, and drives during day, see snapshot for [place]    Occupational performance deficits (Please refer to evaluation for details):  ADL's;IADL's;Leisure;Work;Social Participation    Rehab Potential  Good    OT Frequency  One time visit    OT Duration  8 weeks    OT Treatment/Interventions   Self-care/ADL training;Visual/perceptual remediation/compensation;Patient/family education    Plan  Pt demonstrates good understanding of all education, no additional visits recommended at this time.    Clinical Decision Making  Limited treatment options, no task modification necessary    Consulted and Agree with Plan of Care  Patient;Family member/caregiver    Family Member Consulted  wife, dtr       Patient will benefit from skilled therapeutic intervention in order to improve the following deficits and impairments:  Impaired vision/preception  Visit Diagnosis: [place] involving central area, bilateral - Plan: Ot plan of care cert/re-cert    Problem List Patient Active Problem List   Diagnosis Date Noted  . S/P ICD (internal cardiac defibrillator) procedure 05/03/2017  . NICM (nonischemic cardiomyopathy) ([place]) 05/02/2017  . 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 Severe aortic valve stenosis 03/04/2017  . Hypokalemia due to loss of potassium with diuresis 01/10/2017  . Aortic stenosis 01/10/2017  . Systolic heart failure ([place]) 01/10/2017  . Acute on chronic diastolic CHF (congestive heart failure) ([place]) 01/07/2017  . Hypothyroidism 12/23/2016  . Prostate cancer ([place])   . 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 Edema of both legs 09/01/2014  . Asthma, intermittent 09/01/2014  . Aortic valve disorders 01/01/2011  . Coronary artery disease involving native coronary artery of native heart without angina pectoris   . ACUTE BRONCHITIS 09/06/2010  . VIRAL URI 08/24/2010  . GOUT 09/12/2009  . TINEA PEDIS 08/15/2009  . CELLULITIS, FOOT 08/15/2009  . CAD, AUTOLOGOUS BYPASS GRAFT 05/10/2009  . MURMUR 05/08/2009  . BRUIT 05/08/2009  . MUSCLE CRAMPS 12/12/2008  . KNEE SPRAIN 09/21/2008  . BENIGN PROSTATIC HYPERTROPHY 07/07/2008  . Osteoarthritis 07/07/2008  . CONTUSION, HIP 05/13/2008  . BENIGN PROSTATIC HYPERTROPHY, WITH OBSTRUCTION 02/01/2008  . LOW BACK  PAIN SYNDROME 02/01/2008  . TESTOSTERONE DEFICIENCY 07/03/2007  . HYPERLIPIDEMIA 07/03/2007  .  Essential hypertension 07/03/2007  . MYOCARDIAL INFARCTION, HX OF 07/03/2007  . Allergic rhinitis 07/03/2007  . ACTINIC KERATOSIS 07/03/2007  . COLONIC POLYPS, HX OF 07/03/2007  . ACNE ROSACEA, HX OF 07/03/2007  . 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    , 07/01/2018, 3:32 PM Theone Murdoch, OTR/L [phone] Phone: [phone] 3:32 PM 07/01/18 [facility] [address] [place], [place], [address] Phone: [phone]   Fax:  [phone]  Name: Justin [name] MRN: [id number] Date of Birth: [birth year]

## 2018-07-03 ENCOUNTER — Encounter (HOSPITAL_COMMUNITY): Payer: Self-pay

## 2018-07-03 ENCOUNTER — Encounter (HOSPITAL_COMMUNITY)
Admission: RE | Admit: 2018-07-03 | Discharge: 2018-07-03 | Disposition: A | Discharge status: Home / Self Care | Payer: Medicare Other | Source: Ambulatory Visit | Attending: Cardiovascular Disease | Admitting: Cardiovascular Disease

## 2018-07-06 ENCOUNTER — Encounter (HOSPITAL_COMMUNITY)
Admission: RE | Admit: 2018-07-06 | Discharge: 2018-07-06 | Disposition: A | Discharge status: Home / Self Care | Payer: Self-pay | Source: Ambulatory Visit | Attending: Cardiovascular Disease | Admitting: Cardiovascular Disease

## 2018-07-06 ENCOUNTER — Encounter (HOSPITAL_COMMUNITY): Payer: Self-pay

## 2018-07-07 ENCOUNTER — Telehealth: Payer: Self-pay | Admitting: Cardiology

## 2018-07-07 ENCOUNTER — Ambulatory Visit (INDEPENDENT_AMBULATORY_CARE_PROVIDER_SITE_OTHER): Payer: Medicare Other | Admitting: *Deleted

## 2018-07-07 DIAGNOSIS — I428 Other cardiomyopathies: Secondary | ICD-10-CM | POA: Diagnosis not present

## 2018-07-07 NOTE — Telephone Encounter (Signed)
LMOVM reminding pt to send remote transmission.   

## 2018-07-08 ENCOUNTER — Encounter (HOSPITAL_COMMUNITY): Payer: Self-pay

## 2018-07-08 ENCOUNTER — Encounter: Payer: Self-pay | Admitting: Cardiology

## 2018-07-08 ENCOUNTER — Ambulatory Visit: Payer: Medicare Other | Admitting: Occupational Therapy

## 2018-07-08 ENCOUNTER — Encounter (HOSPITAL_COMMUNITY)
Admission: RE | Admit: 2018-07-08 | Discharge: 2018-07-08 | Disposition: A | Discharge status: Home / Self Care | Payer: Medicare Other | Source: Ambulatory Visit | Attending: Cardiovascular Disease | Admitting: Cardiovascular Disease

## 2018-07-08 NOTE — Progress Notes (Signed)
Letter  

## 2018-07-08 NOTE — Progress Notes (Signed)
Remote ICD transmission.   

## 2018-07-10 ENCOUNTER — Encounter (HOSPITAL_COMMUNITY): Payer: Self-pay

## 2018-07-10 ENCOUNTER — Encounter (HOSPITAL_COMMUNITY)
Admission: RE | Admit: 2018-07-10 | Discharge: 2018-07-10 | Disposition: A | Discharge status: Home / Self Care | Payer: Medicare Other | Source: Ambulatory Visit | Attending: Cardiovascular Disease | Admitting: Cardiovascular Disease

## 2018-07-13 ENCOUNTER — Encounter (HOSPITAL_COMMUNITY): Payer: Self-pay

## 2018-07-13 ENCOUNTER — Encounter (HOSPITAL_COMMUNITY)
Admission: RE | Admit: 2018-07-13 | Discharge: 2018-07-13 | Disposition: A | Discharge status: Home / Self Care | Payer: Medicare Other | Source: Ambulatory Visit | Attending: Cardiovascular Disease | Admitting: Cardiovascular Disease

## 2018-07-15 ENCOUNTER — Encounter (HOSPITAL_COMMUNITY)
Admission: RE | Admit: 2018-07-15 | Discharge: 2018-07-15 | Disposition: A | Discharge status: Home / Self Care | Payer: Self-pay | Source: Ambulatory Visit | Attending: Cardiovascular Disease | Admitting: Cardiovascular Disease

## 2018-07-15 ENCOUNTER — Encounter (HOSPITAL_COMMUNITY): Payer: Self-pay

## 2018-07-17 ENCOUNTER — Encounter (HOSPITAL_COMMUNITY): Payer: Self-pay

## 2018-07-17 ENCOUNTER — Encounter (HOSPITAL_COMMUNITY)
Admission: RE | Admit: 2018-07-17 | Discharge: 2018-07-17 | Disposition: A | Discharge status: Home / Self Care | Payer: Self-pay | Source: Ambulatory Visit | Attending: Cardiovascular Disease | Admitting: Cardiovascular Disease

## 2018-07-18 ENCOUNTER — Other Ambulatory Visit: Payer: Self-pay | Admitting: Pulmonary Disease

## 2018-07-20 ENCOUNTER — Encounter (HOSPITAL_COMMUNITY)
Admission: RE | Admit: 2018-07-20 | Discharge: 2018-07-20 | Disposition: A | Discharge status: Home / Self Care | Payer: Self-pay | Source: Ambulatory Visit | Attending: Cardiovascular Disease | Admitting: Cardiovascular Disease

## 2018-07-20 ENCOUNTER — Encounter (HOSPITAL_COMMUNITY): Payer: Self-pay

## 2018-07-20 ENCOUNTER — Telehealth: Payer: Self-pay | Admitting: Gastroenterology

## 2018-07-20 NOTE — Telephone Encounter (Signed)
Per previous documentation, ppt was prescribed Lidocaine 2% by Dr. Sarajane Jews; would you advise prescribing this medication for the patient, have him come in for a office visit or have him notify Dr. Sarajane Jews for a refill? Or alternative options? Please advise

## 2018-07-20 NOTE — Telephone Encounter (Signed)
I believe I have an open slot in clinic tomorrow afternoon and possible Wed morning, and can see him then to assess and give an Rx. Thanks.

## 2018-07-20 NOTE — Telephone Encounter (Signed)
Called and spoke with patient-patient reports his symptoms have recurred the same way as last time; patient reports he did finish the entire antibiotic Rx, had relief of symptoms, but he is still using the nasal inhaler (just different schedule from last time); patient reports he is going out of town on Wednesday for the holidays and wants "to get the situation handled before he goes"; patient states he would like any Rx's that are to be sent in to go to [facility] please; Would you like to see the patient for an office visit to assess or alternative plan of care-he does report symptoms have been getting worse over the last 48 hrs; Please advise on plan of care;

## 2018-07-20 NOTE — Telephone Encounter (Signed)
FYI-Called and spoke with patient- patient informed of MD recommendations and is agreeable with plan of care; patient scheduled for 07/21/18 arrival at 1:15 pm for 1:30 pm appt; patient verbalized understanding; patient advised to call back if questions/concerns arise;

## 2018-07-20 NOTE — Telephone Encounter (Signed)
Patient states that he is having difficulty swallowing again and would like same medication that was prescribed for these symptoms

## 2018-07-20 NOTE — Telephone Encounter (Signed)
Did he take and complete the fluconazole as previously prescribed for esophageal candidiasis? Did he have relief with that treatment and now symptoms have recurred? If that is the case, there is concern for recurrence of candida. I am ok with giving another Rx for Lidocaine 2% for now, but do not want to simply mask the symptoms if its really recurrence of infection. Given the upcoming long holiday weekend, lets plan on refill of the Lidocaine with follow-up appointment with me to go through in further detail.   Thank you.

## 2018-07-21 ENCOUNTER — Ambulatory Visit: Payer: Medicare Other | Admitting: Gastroenterology

## 2018-07-21 ENCOUNTER — Ambulatory Visit: Payer: Medicare Other | Admitting: Occupational Therapy

## 2018-07-21 ENCOUNTER — Encounter: Payer: Self-pay | Admitting: Gastroenterology

## 2018-07-21 VITALS — BP 130/64 | HR 79 | Ht 68.0 in | Wt 265.1 lb

## 2018-07-21 DIAGNOSIS — K219 Gastro-esophageal reflux disease without esophagitis: Secondary | ICD-10-CM

## 2018-07-21 DIAGNOSIS — B3781 Candidal esophagitis: Secondary | ICD-10-CM | POA: Diagnosis not present

## 2018-07-21 MED ORDER — FLUCONAZOLE 200 MG PO TABS: 200.0000 mg | ORAL_TABLET | ORAL | 0 refills | Status: DC

## 2018-07-21 NOTE — Progress Notes (Signed)
P  Chief Complaint:    Dysphagia  GI History: 84 year old male with a prior hx of irritable larynx syndrome and mild persistent asthma, as well as CAD w/ CABG in 2007, TAVR in 2018, paroxysmal Afib, HTN, Prostate CA, and GERD,  initially seen by me on 05/25/2018 for new onset progressive dysphagia.  He was initially given an Rx for lidocaine by his PCM with some clinical improvement along with increasing his acid suppression therapy.  EGD on 05/29/2018 was notable for esophageal candidiasis, treated with fluconazole x21 days.  Otherwise normal-appearing Z line, Hill grade 2 valve, normal stomach and duodenum.  His GERD is otherwise generally well controlled with omeprazole and Zantac (no longer taking Zantac due to recent recall).  HPI:     Patient is a 84 y.o. male presenting to [facility] for follow-up.  He was initially seen by me on 05/25/2018 for new onset progressive dysphagia with subsequent EGD notable for esophageal candidiasis.  He was treated with fluconazole x21 days.  He reports resolution of dysphagia with antifungal therapy, but recent recurrence of dysphagia since completing therapy. Sxs started to recur late last week, pointing to anterior neck/suprasternal notch, similar to index presentation. He continues to take Symbicort BID, but is drinking water after and brushes teeth immediately following inhaler use.  Otherwise no recent changes in medications.  No prior history of chemotherapy, immunosuppression.  No previous blood transfusions and no sited risk factors for HIV.  He is leaving tomorrow AM to head to the beach with his family for Thanksgiving for 1 week.   Endoscopic Hx: -EGD (05/29/2018, Dr. Bryan Lemma): Esophageal candidiasis, regular Z line, Hill grade 2 valve, normal stomach and duodenum.  Treated with fluconazole x21 days. - Colonoscopy (06/2008; Dr. Henrene Pastor): 5 mm inflammatory polyp in ascending colon, sigmoid diverticulosis. Recommended repeat in 5  years - Colonoscopies also completed in 1997, 2000, 2004, notable for TA  Review of systems:     No chest pain, no SOB, no fevers, no urinary sx   Past Medical History:  Diagnosis Date  . Age-related macular degeneration, dry, both eyes   . Allergic    "24/7; 365 days/year; I'm allergic to pollens, dust, all southern grasses/trees, mold, mildue, cats, dogs" (01/07/2017)  . Anal fissure   . Asthma    sees [name]   . Benign prostatic hypertrophy    (sees Dr. Denman George  . CAD (coronary artery disease)    a. s/p CABG 2007. b. Cath 08/2016 - 4/4 patent grafts.  . Carotid bruit    carotid u/s 10/10: 0.39% bilaterally  . Chronic combined systolic and diastolic CHF (congestive heart failure) ([place])   . Complication of anesthesia 1980s   "w/anal cyst OR, he gave me a saddle block then put a narcotic in spinal cord; had a severe reaction to that" (01/07/2017)  . Congestive heart failure (CHF) ([place])   . ED (erectile dysfunction)   . Family history of adverse reaction to anesthesia    "daughter wakes up during OR" (01/07/2017)  . GERD (gastroesophageal reflux disease)   . Gout   . HTN (hypertension)   . Hx of colonic polyps    (sees Dr. Henrene Pastor)  . Hyperlipidemia   . Hypothyroidism   . Moderate to severe aortic stenosis    a. s/p TAVR 02/2017.  [name] Myocardial infarction ([place]) ~ 2000  . Obesity   . Osteoarthritis    "was in my knees, hands" (01/07/2017 )  . PAF (paroxysmal atrial fibrillation) (  [place])    a. documented post TAVR.  [name] Precancerous skin lesion    (sees Dr. Allyson Sabal)  . Prostate cancer ([place]) dx'd ~ 2014  . 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 aspirin 81 MG tablet Take 1 tablet (81 mg total) by mouth daily. 30 tablet 0  . atorvastatin (LIPITOR) 20 MG tablet TAKE 1 TABLET ONCE DAILY. 90 tablet 0  . azithromycin (ZITHROMAX) 250 MG tablet Take as directed (Patient taking differently: Take 250 mg by mouth as needed. Take as directed) 6 tablet 1  . benzonatate (TESSALON) 200 MG capsule TAKE 1 CAPSULE EVERY 8 HOURS AS NEEDED FOR COUGH. 45 capsule 1  . cetirizine (ZYRTEC) 10 MG tablet Take 10 mg by mouth daily.    . chlorpheniramine (CHLOR-TRIMETON) 4 MG tablet Take 8 mg by mouth 2 (two) times daily.     . ciprofloxacin (CIPRO) 500 MG tablet Take 1 tablet (500 mg total) by mouth 2 (two) times daily. (Patient taking differently: Take 500 mg by mouth as needed. ) 20 tablet 0  . DYMISTA 137-50 MCG/ACT SUSP USE 2 SPRAYS EACH NOSTRIL TWICE DAILY. 23 g 1  . furosemide (LASIX) 80 MG tablet Take 80 mg daily alternating with 80 mg twice daily. 135 tablet 3  . ipratropium (ATROVENT) 0.03 % nasal spray Place 2 sprays into both nostrils every 6 (six) hours as needed for rhinitis. 30 mL 4  . isosorbide mononitrate (IMDUR) 30 MG 24 hr tablet TAKE 1 TABLET ONCE DAILY. 90 tablet 0  . Ketotifen Fumarate (ZADITOR OP) Apply 1 drop to eye daily as needed (dry eyes).    [name] levothyroxine (SYNTHROID, LEVOTHROID) 75 MCG tablet Take 1 tablet (75 mcg total) by mouth daily. 180 tablet 3  . lidocaine (XYLOCAINE) 2 % solution Use as directed 10 mLs in the mouth or throat every 6 (six) hours as needed (sore throat). 100 mL 2  . losartan (COZAAR) 25 MG tablet Take 1 tablet (25 mg total) by mouth daily. 90 tablet 3  . metoprolol tartrate (LOPRESSOR) 25 MG tablet Take 25 mg by mouth 2 (two) times daily.    . Multiple Vitamins-Minerals (PRESERVISION AREDS PO) Take 2 tablets by mouth every morning.    [name] omeprazole (PRILOSEC) 20 MG capsule Take 1 capsule (20 mg total) by mouth daily. (Patient taking differently: Take 20 mg by mouth 2 (two) times daily. ) 90 capsule 4  .  Potassium Chloride ER 20 MEQ TBCR Take 20 mEq by mouth daily.    . potassium chloride SA (K-DUR,KLOR-CON) 20 MEQ tablet Take 2 tablets by mouth twice daily alternating with 2 tablets daily. 270 tablet 3  . PRADAXA 150 MG CAPS capsule TAKE (1) CAPSULE TWICE DAILY. 180 capsule 1  . ranitidine (ZANTAC) 150 MG capsule Take 150 mg by mouth every evening.     . SYMBICORT 80-4.5 MCG/ACT inhaler USE 2 PUFFS TWICE DAILY. 10.2 g 0  . triamcinolone cream (KENALOG) 0.1 % Apply 1 application topically daily as needed for dry skin.    . valACYclovir (VALTREX) 500 MG tablet Take 1 tablet (500 mg total) by mouth 2 (two) times daily. 60 tablet 5   No current facility-administered  medications for this visit.     Physical Exam:     BP 130/64   Pulse 79   Ht 5\' 8"  (1.727 m)   Wt 265 lb 2 oz (120.3 kg)   BMI 40.31 kg/m   GENERAL:  Pleasant male in NAD PSYCH: : Cooperative, normal affect EENT:  conjunctiva pink, mucous membranes moist, neck supple without masses.  No oral candidiasis noted CARDIAC:  RRR, no murmur heard, no peripheral edema PULM: Normal respiratory effort, lungs CTA bilaterally, no wheezing ABDOMEN:  Nondistended, soft, nontender. No obvious masses, no hepatomegaly,  normal bowel sounds SKIN:  turgor, no lesions seen Musculoskeletal:  Normal muscle tone, normal strength NEURO: Alert and oriented x 3, no focal neurologic deficits   IMPRESSION and PLAN:    #1.  Esophageal candidiasis: Recent EGD notable for esophageal candidiasis which clinically responded to appropriate course of fluconazole.  However, with recurrence of index symptoms since completing course of antifungal medication a few weeks ago.  Discussed esophageal candidiasis, to include recurrence and will evaluate and treat as below:  - Discussed treating with another course of fluconazole versus changing to alternate agent (i.e. voriconazole).  Given his response to therapy previously, he would like to proceed with another  course of fluconazole, which seems reasonable. - Fluconazole 400 mg on day 1 then 200 mg daily for 20 additional days -We did discuss further evaluation, to include HIV testing, which he politely declined at this time citing no risk factors and no prior history of immunosuppression or opportunistic type infection.  If successfully treated but then another recurrence, explained that we will certainly be obligated to look for underlying immunosuppression, to include HIV testing, CBC, etc and potentially referral to infectious disease, which he certainly understands. - Resume dysphagia measures at this time, to include cutting food into small pieces, drink plenty fluids with meals, etc. -Continue to drink fluids after inhaled steroids    #2.  GERD: Well-controlled on acid suppression therapy.  No plan to change at this time.  RTC in 3 to 6 months or sooner as needed.  I spent a total of 15 minutes of face-to-face time with the patient. Greater than 50% of the time was spent counseling and coordinating care.      Lavena Bullion ,DO, FACG 07/21/2018, 1:34 PM

## 2018-07-21 NOTE — Patient Instructions (Signed)
If you are age 82 or older, your body mass index should be between 23-30. Your Body mass index is 40.31 kg/m. If this is out of the aforementioned range listed, please consider follow up with your Primary Care Provider.  If you are age 57 or younger, your body mass index should be between 19-25. Your Body mass index is 40.31 kg/m. If this is out of the aformentioned range listed, please consider follow up with your Primary Care Provider.   We have sent the following medications to your pharmacy for you to pick up at your convenience: Fluconazole 200mg  Take two tablets by mouth on day 1 Take one tablet by mouth for the next 20 days Then Stop.  It was a pleasure to see you today!  Vito Cirigliano, D.O.

## 2018-07-22 ENCOUNTER — Encounter (HOSPITAL_COMMUNITY): Payer: Self-pay

## 2018-07-27 ENCOUNTER — Encounter (HOSPITAL_COMMUNITY): Payer: Self-pay

## 2018-07-27 DIAGNOSIS — Z952 Presence of prosthetic heart valve: Secondary | ICD-10-CM | POA: Insufficient documentation

## 2018-07-27 DIAGNOSIS — I350 Nonrheumatic aortic (valve) stenosis: Secondary | ICD-10-CM | POA: Insufficient documentation

## 2018-07-29 ENCOUNTER — Encounter (HOSPITAL_COMMUNITY): Payer: Self-pay

## 2018-07-31 ENCOUNTER — Encounter (HOSPITAL_COMMUNITY): Payer: Self-pay

## 2018-07-31 ENCOUNTER — Other Ambulatory Visit: Payer: Self-pay | Admitting: Family Medicine

## 2018-08-01 ENCOUNTER — Other Ambulatory Visit: Payer: Self-pay | Admitting: Cardiovascular Disease

## 2018-08-01 ENCOUNTER — Other Ambulatory Visit: Payer: Self-pay | Admitting: Family Medicine

## 2018-08-03 ENCOUNTER — Encounter (HOSPITAL_COMMUNITY)
Admission: RE | Admit: 2018-08-03 | Discharge: 2018-08-03 | Disposition: A | Discharge status: Home / Self Care | Payer: Medicare Other | Source: Ambulatory Visit | Attending: Cardiovascular Disease | Admitting: Cardiovascular Disease

## 2018-08-03 ENCOUNTER — Encounter (HOSPITAL_COMMUNITY): Payer: Self-pay

## 2018-08-04 ENCOUNTER — Ambulatory Visit: Payer: Medicare Other | Admitting: Primary Care

## 2018-08-04 ENCOUNTER — Encounter: Payer: Self-pay | Admitting: Primary Care

## 2018-08-04 VITALS — BP 120/78 | HR 96 | Temp 98.2°F | Ht 68.0 in | Wt 262.0 lb

## 2018-08-04 DIAGNOSIS — R05 Cough: Secondary | ICD-10-CM | POA: Diagnosis not present

## 2018-08-04 DIAGNOSIS — R059 Cough, unspecified: Secondary | ICD-10-CM

## 2018-08-04 LAB — NITRIC OXIDE: FENO LEVEL (PPB): 45

## 2018-08-04 MED ORDER — ALBUTEROL SULFATE HFA 108 (90 BASE) MCG/ACT IN AERS: 2.0000 | INHALATION_SPRAY | Freq: Four times a day (QID) | RESPIRATORY_TRACT | 6 refills | Status: DC | PRN

## 2018-08-04 MED ORDER — AZITHROMYCIN 250 MG PO TABS: ORAL_TABLET | ORAL | 1 refills | Status: DC

## 2018-08-04 MED ORDER — PREDNISONE 10 MG PO TABS: ORAL_TABLET | ORAL | 0 refills | Status: DC

## 2018-08-04 NOTE — Patient Instructions (Addendum)
  Trial off Symbicort d/t candidiasis and normal PFTs  Needs oral prednisone course x 5 days d/t elevated FENO  Albuterol rescue inhaler 2 puffs every 4-6 hours as needed for sob/wheezing   Cough: Take Delsym 5 to 7mL every 12 hours as needed for cough  Rhinitis: Continue Dymista, Zyrtec and Singulair as prescribed *Do not use Ipratroprium and Dymista together. You can use Ipratropium short term on its own to relieve sinus symptoms.   GERD: Continue Protonix 2 tabs in am and take Famotidine at bedtime  Office testing: Spirometry and FENO today   Follow up in 2-3 weeks with Eustaquio Maize NP

## 2018-08-04 NOTE — Progress Notes (Signed)
@Patient  ID: Justin [name], male    DOB: 1934/02/06, 84 y.o.   MRN: [id number]  Chief Complaint  Patient presents with  . Acute Visit    fungus, med changes, cough with clear phlegm, allergy pills, antacid, 3 times a day sneezing fit.    Referring provider: Laurey Morale, MD   Synopsis: Former patient of Dr. Gwenette Greet who has irritable larynx syndrome as well as mild persistent asthma. May 2017 CT scan of the sinuses was a poor study, there is no clear acute or chronic sinusitis noted. Previously treated with immunotherapy by Dr. Velora Heckler many years prior.    HPI: 84 year old male, former smoker.  Past medical history significant for irritable larynx syndrome, mild persistent asthma and GERD.  Patient of [name], last seen 05/21/2018.  During that visit patient complained of sore throat and cough which was treated with Z-Pak and as needed Tessalon Perles.  Maintained on Symbicort puffs twice daily.  Continues dymista, Zyrtec and Singulair.  Receives cardiac rehab as maintenance.  08/05/2018 Presents today for an acute visit with complaints of dry cough x 6 weeks. If he brings up mucus its clear. Associated post nasal drip and throat clearing. Shows me pictures from recent endoscopy that showed recurrent esophageal candidiasis, treated with diflucan and lidocaine. States that he feels its cleared up. He is taking 40mg  omeprazole in the morning and pepcid at bedtime. Feels this regimen in working. There is a concern that it is coming from his maintenance inhaler. He rinses his mouth after use. States that he stopped Symbicort for 7-10 days and did not have a problem being off it. Denies wheezing or sob. He does not have a rescue inhaler. FENO was 45 today.   Continues to have post nasal drip symptoms which he feels it triggering his cough. Using Dymista twice a day and states that its fantastic. Taking Tessalon perles as needed, has 1 refill. Takes Antihistamine in the morning, works all day  but has sinus drainage around 4 am. Asking if he can use an old prescription for Ipratropium nasal spray. Requesting refill zpack to take as needed, has used this appropriately in the past.   08/04/2018 FVC 2.6 (73), FEV1 2.0 (78%), ratio 74.     Allergies  Allergen Reactions  . Peanut-Containing Drug Products Anaphylaxis  . Sulfonamide Derivatives Anaphylaxis  . Amlodipine Swelling    Swelling in ankles  . Eliquis [Apixaban] Other (See Comments)    Back/hip pain  . Lisinopril Cough    cough  . Xarelto [Rivaroxaban] Other (See Comments)    Back/hip pain    Immunization History  Administered Date(s) Administered  . Hep A / Hep B 04/06/2012, 10/07/2012  . Hepatitis B 05/07/2012  . Influenza Split 06/28/2013  . Influenza Whole 06/05/2007, 05/13/2008, 06/06/2009, 05/30/2010  . Influenza, High Dose Seasonal PF 05/15/2018  . Influenza,inj,Quad PF,6+ Mos 05/20/2016  . Influenza-Unspecified 05/25/2014, 07/08/2015, 05/06/2017  . Pneumococcal Conjugate-13 09/18/2015  . Pneumococcal Polysaccharide-23 05/13/2008, 05/25/2014  . Td 05/30/2010  . Zoster 10/21/2007  . Zoster Recombinat (Shingrix) 05/18/2018    Past Medical History:  Diagnosis Date  . Age-related macular degeneration, dry, both eyes   . Allergic    "24/7; 365 days/year; I'm allergic to pollens, dust, all southern grasses/trees, mold, mildue, cats, dogs" (01/07/2017)  . Anal fissure   . Asthma    sees [name]   . Benign prostatic hypertrophy    (sees Dr. Denman George  . CAD (coronary artery disease)  a. s/p CABG 2007. b. Cath 08/2016 - 4/4 patent grafts.  . Carotid bruit    carotid u/s 10/10: 0.39% bilaterally  . Chronic combined systolic and diastolic CHF (congestive heart failure) ([place])   . Complication of anesthesia 1980s   "w/anal cyst OR, he gave me a saddle block then put a narcotic in spinal cord; had a severe reaction to that" (01/07/2017)  . Congestive heart failure (CHF) ([place])   . ED (erectile  dysfunction)   . Family history of adverse reaction to anesthesia    "daughter wakes up during OR" (01/07/2017)  . GERD (gastroesophageal reflux disease)   . Gout   . HTN (hypertension)   . Hx of colonic polyps    (sees Dr. Henrene Pastor)  . Hyperlipidemia   . Hypothyroidism   . Moderate to severe aortic stenosis    a. s/p TAVR 02/2017.  [name] Myocardial infarction ([place]) ~ 2000  . Obesity   . Osteoarthritis    "was in my knees, hands" (01/07/2017 )  . PAF (paroxysmal atrial fibrillation) ([place])    a. documented post TAVR.  [name] Precancerous skin lesion    (sees Dr. Allyson Sabal)  . Prostate cancer ([place]) dx'd ~ 2014  . 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 Packs/day: 3.50  . Years: 13.00  . Pack years: 45.50  . Types: Cigarettes  . Last attempt to quit: 1963  . Years since quitting: 56.9  Smokeless Tobacco Never Used   Counseling given: Not Answered   Outpatient Medications Prior to Visit  Medication Sig Dispense Refill  . aspirin 81 MG tablet Take 1 tablet (81 mg total) by mouth daily. 30 tablet 0  . atorvastatin (LIPITOR) 20 MG tablet TAKE 1 TABLET ONCE DAILY. 90 tablet 0  . benzonatate (TESSALON) 200 MG capsule TAKE 1 CAPSULE EVERY 8 HOURS AS NEEDED FOR COUGH. 45 capsule 1  . cetirizine (ZYRTEC) 10 MG tablet Take 10 mg by mouth daily.    . chlorpheniramine (CHLOR-TRIMETON) 4 MG tablet Take 8 mg by mouth 2 (two) times daily.     [name] DYMISTA 137-50 MCG/ACT SUSP USE 2 SPRAYS EACH NOSTRIL TWICE DAILY. 23 g 1  . fluconazole (DIFLUCAN) 200 MG tablet Take 1 tablet (200 mg total) by mouth as directed. Take two tablets by mouth on day 1 Take one tablet by mouth for the next 20 days Then Stop. 22 tablet 0  . furosemide (LASIX) 80 MG tablet Take 80 mg daily alternating  with 80 mg twice daily. 135 tablet 3  . ipratropium (ATROVENT) 0.03 % nasal spray Place 2 sprays into both nostrils every 6 (six) hours as needed for rhinitis. 30 mL 4  . isosorbide mononitrate (IMDUR) 30 MG 24 hr tablet TAKE 1 TABLET ONCE DAILY. 90 tablet 0  . Ketotifen Fumarate (ZADITOR OP) Apply 1 drop to eye daily as needed (dry eyes).    [name] levothyroxine (SYNTHROID, LEVOTHROID) 75 MCG tablet Take 1 tablet (75 mcg total) by mouth daily. 180 tablet 3  . losartan (COZAAR) 25 MG tablet TAKE 1 TABLET EACH DAY. 90 tablet 1  . metoprolol tartrate (LOPRESSOR) 25 MG tablet TAKE 1 TABLET BY MOUTH TWICE DAILY. 180 tablet 1  . Multiple Vitamins-Minerals (PRESERVISION AREDS PO) Take 2 tablets by mouth every morning.    [name]  omeprazole (PRILOSEC) 20 MG capsule Take 1 capsule (20 mg total) by mouth daily. (Patient taking differently: Take 20 mg by mouth 2 (two) times daily. ) 90 capsule 4  . Potassium Chloride ER 20 MEQ TBCR Take 20 mEq by mouth daily.    . potassium chloride SA (K-DUR,KLOR-CON) 20 MEQ tablet Take 2 tablets by mouth twice daily alternating with 2 tablets daily. 270 tablet 3  . PRADAXA 150 MG CAPS capsule TAKE (1) CAPSULE TWICE DAILY. 180 capsule 1  . ranitidine (ZANTAC) 150 MG capsule Take 150 mg by mouth every evening.     . SYMBICORT 80-4.5 MCG/ACT inhaler USE 2 PUFFS TWICE DAILY. 10.2 g 0  . triamcinolone cream (KENALOG) 0.1 % Apply 1 application topically daily as needed for dry skin.    . valACYclovir (VALTREX) 500 MG tablet Take 1 tablet (500 mg total) by mouth 2 (two) times daily. 60 tablet 5  . azithromycin (ZITHROMAX) 250 MG tablet Take as directed (Patient taking differently: Take 250 mg by mouth as needed. Take as directed) 6 tablet 1  . ciprofloxacin (CIPRO) 500 MG tablet Take 1 tablet (500 mg total) by mouth 2 (two) times daily. (Patient taking differently: Take 500 mg by mouth as needed. ) 20 tablet 0  . lidocaine (XYLOCAINE) 2 % solution Use as directed 10 mLs in the mouth or  throat every 6 (six) hours as needed (sore throat). (Patient not taking: Reported on 08/04/2018) 100 mL 2   No facility-administered medications prior to visit.     Review of Systems  Review of Systems  Constitutional: Negative.   HENT: Positive for postnasal drip.   Respiratory: Positive for cough. Negative for shortness of breath and wheezing.   Cardiovascular: Negative.   Gastrointestinal: Negative.     Physical Exam  BP 120/78 (BP Location: Left Arm, Cuff Size: Normal)   Pulse 96   Temp 98.2 F (36.8 C)   Ht 5\' 8"  (1.727 m)   Wt 262 lb (118.8 kg)   SpO2 98%   BMI 39.84 kg/m  Physical Exam  Constitutional: He is oriented to person, place, and time. He appears well-developed and well-nourished. No distress.  Well appearing gentleman   HENT:  Head: Normocephalic and atraumatic.  Eyes: Pupils are equal, round, and reactive to light. EOM are normal.  Neck: Normal range of motion. Neck supple.  Cardiovascular: Normal rate and regular rhythm.  Pulmonary/Chest: Effort normal and breath sounds normal.  Musculoskeletal: Normal range of motion.  Neurological: He is alert and oriented to person, place, and time.  Skin: Skin is warm.  Psychiatric: He has a normal mood and affect. His behavior is normal. Judgment and thought content normal.     Lab Results:  CBC    Component Value Date/Time   WBC 10.7 (H) 12/30/2017 1633   RBC 4.79 12/30/2017 1633   HGB 13.0 12/30/2017 1633   HGB 13.9 04/23/2017 1012   HCT 39.7 12/30/2017 1633   HCT 42.1 04/23/2017 1012   PLT 229.0 12/30/2017 1633   PLT 241 04/23/2017 1012   MCV 82.9 12/30/2017 1633   MCV 84 04/23/2017 1012   MCH 27.9 04/23/2017 1012   MCH 27.0 03/07/2017 0231   MCHC 32.7 12/30/2017 1633   RDW 16.4 (H) 12/30/2017 1633   RDW 15.8 (H) 04/23/2017 1012   LYMPHSABS 2.2 12/30/2017 1633   LYMPHSABS 2.0 04/23/2017 1012   MONOABS 1.1 (H) 12/30/2017 1633   EOSABS 0.4 12/30/2017 1633   EOSABS 0.3 04/23/2017 1012  BASOSABS 0.2 (H) 12/30/2017 1633   BASOSABS 0.1 04/23/2017 1012    BMET    Component Value Date/Time   NA 140 12/30/2017 1633   NA 142 04/23/2017 1012   K 3.5 12/30/2017 1633   CL 104 12/30/2017 1633   CO2 25 12/30/2017 1633   GLUCOSE 123 (H) 12/30/2017 1633   GLUCOSE 87 06/30/2006 1132   BUN 14 12/30/2017 1633   BUN 13 04/23/2017 1012   CREATININE 0.73 12/30/2017 1633   CREATININE 0.74 08/14/2016 1311   CALCIUM 8.3 (L) 12/30/2017 1633   GFRNONAA 87 04/23/2017 1012   GFRAA 101 04/23/2017 1012    BNP    Component Value Date/Time   BNP 653.3 (H) 01/07/2017 1705    ProBNP No results found for: PROBNP  Imaging: No results found.   Assessment & Plan:   Asthma, intermittent - Trial off Symbicort 80 due to oral/esophageal candidiasis and mostly normal spirometry today - Albuterol rescue inhaler 2 puffs every 4-6 hours as needed for breakthrough sob/wheezing  - FU in 2-3 weeks, if no exacerbation can remain off ICS/LAMA. If symptoms flare consider changing agent   Allergic rhinitis - Continues genertic Zyrtec, Singulair and Dysmista nasal spray   ACUTE BRONCHITIS - Dry cough x 6 weeks - FENO 45 - RX prednisone x 5 days - Zpack to have on hold okay    Martyn Ehrich, NP 08/05/2018

## 2018-08-05 ENCOUNTER — Encounter: Payer: Self-pay | Admitting: Primary Care

## 2018-08-05 ENCOUNTER — Ambulatory Visit (INDEPENDENT_AMBULATORY_CARE_PROVIDER_SITE_OTHER): Payer: Medicare Other | Admitting: Family Medicine

## 2018-08-05 ENCOUNTER — Encounter (HOSPITAL_COMMUNITY): Payer: Self-pay

## 2018-08-05 ENCOUNTER — Encounter (HOSPITAL_COMMUNITY)
Admission: RE | Admit: 2018-08-05 | Discharge: 2018-08-05 | Disposition: A | Discharge status: Home / Self Care | Payer: Self-pay | Source: Ambulatory Visit | Attending: Cardiovascular Disease | Admitting: Cardiovascular Disease

## 2018-08-05 ENCOUNTER — Encounter: Payer: Self-pay | Admitting: Family Medicine

## 2018-08-05 VITALS — BP 110/60 | HR 87 | Temp 97.6°F | Wt 261.5 lb

## 2018-08-05 DIAGNOSIS — M159 Polyosteoarthritis, unspecified: Secondary | ICD-10-CM

## 2018-08-05 DIAGNOSIS — M150 Primary generalized (osteo)arthritis: Secondary | ICD-10-CM

## 2018-08-05 NOTE — Assessment & Plan Note (Signed)
-   Dry cough x 6 weeks - FENO 45 - RX prednisone x 5 days - Zpack to have on hold okay

## 2018-08-05 NOTE — Progress Notes (Signed)
Reviewed, agree 

## 2018-08-05 NOTE — Assessment & Plan Note (Addendum)
-   Continues genertic Zyrtec, Singulair and Dysmista nasal spray

## 2018-08-05 NOTE — Progress Notes (Signed)
   Subjective:    Patient ID: Justin [name], male    DOB: Jan 28, 1934, 84 y.o.   MRN: [id number]  HPI Here to ask about his hands. He has had mild intermittent stiffness in one hand or the other for several years, but about 3 weeks ago this started up in both hands. There is little pain. No redness or warmth. The fingers swell slightly. There is very little swelling in his feet.    Review of Systems  Constitutional: Negative.   Respiratory: Negative.   Cardiovascular: Negative.   Musculoskeletal: Positive for arthralgias and joint swelling.  Neurological: Negative.        Objective:   Physical Exam  Constitutional: He appears well-developed and well-nourished.  Cardiovascular: Normal rate, regular rhythm, normal heart sounds and intact distal pulses.  Pulmonary/Chest: Effort normal and breath sounds normal.  Musculoskeletal: He exhibits no edema.  Both hands appear normal. No tenderness or swelling.           Assessment & Plan:  Stiffness in the hands, consistent with osteoarthritis. He may use Tylenol. Recheck prn. [name], MD

## 2018-08-05 NOTE — Assessment & Plan Note (Addendum)
-   Trial off Symbicort 80 due to oral/esophageal candidiasis and mostly normal spirometry today - Albuterol rescue inhaler 2 puffs every 4-6 hours as needed for breakthrough sob/wheezing  - FU in 2-3 weeks, if no exacerbation can remain off ICS/LAMA. If symptoms flare consider changing agent

## 2018-08-06 ENCOUNTER — Telehealth: Payer: Self-pay | Admitting: Primary Care

## 2018-08-06 NOTE — Telephone Encounter (Signed)
Can you call patient and let him know I spoke with [name],   I told him we discussed trial off Symbicort and he agrees... said to use it twice a day only as needed as opposed to every day scheduled. thx

## 2018-08-06 NOTE — Telephone Encounter (Signed)
Relayed info.  Nothing further needed at this time.

## 2018-08-07 ENCOUNTER — Encounter (HOSPITAL_COMMUNITY): Payer: Self-pay

## 2018-08-07 ENCOUNTER — Other Ambulatory Visit: Payer: Self-pay | Admitting: Family Medicine

## 2018-08-07 MED ORDER — ISOSORBIDE MONONITRATE ER 30 MG PO TB24: 30.0000 mg | ORAL_TABLET | Freq: Every day | ORAL | 1 refills | Status: DC

## 2018-08-07 NOTE — Progress Notes (Signed)
Chief Complaint  Patient presents with  . Follow-up    CAD    History of Present Illness: 84 yo male with history of HTN, HLD, severe aortic stenosis s/p TAVR, PAF, chronic diastolic CHF and CAD s/p 4V CABG, syncope, junctional bradycardia, PVCS, non-sustained VT and LBBB s/p ICD implantation who is here today for one month TAVR follow up. He is known to have CAD s/p 4V CABG in March 2007 (LIMA to LAD, SVG to Diagonal, SVG to OM, SVG to RCA). Last cardiac cath in January 2018 with 4/4 patent bypass grafts. He had progression of his aortic stenosis with worsened dyspnea and acute on chronic CHF in the spring of 2018. Echo May 2018 with mildly reduced LV systolic function, CBJS=28-31%. His aortic stenosis was severe. He underwent TAVR on 03/04/17 with a 29 mm Edwards Sapien 3 valve from the right transfemoral approach.  He developed atrial fibrillation following his procedure and was placed on IV amiodarone with conversion to sinus rhythm. He has been on ASA and Eliquis (Xarelto had been used initially but pt thought it caused him to have shoulder pain). Follow up visit in our office 03/19/17 and pt noted to have irregular rhythm with intraventricular conduction delay and was felt by our EP team to represent 2:1 conduction of competing fast and slow pathways. Cardiac monitor with sinus rhythm with periods of junctional rhythm, frequent PVCs, frequent PACs and runs of a wide complex tachycardia. ICD was implanted on 05/03/17.   He is here today for follow up. The patient denies any chest pain, dyspnea, palpitations, lower extremity edema, orthopnea, PND, dizziness, near syncope or syncope.   Primary Care Physician: Laurey Morale, MD  Past Medical History:  Diagnosis Date  . Age-related macular degeneration, dry, both eyes   . Allergic    "24/7; 365 days/year; I'm allergic to pollens, dust, all southern grasses/trees, mold, mildue, cats, dogs" (01/07/2017)  . Anal fissure   . Asthma    sees Dr.  Lake Bells   . Benign prostatic hypertrophy    (sees Dr. Denman George  . CAD (coronary artery disease)    a. s/p CABG 2007. b. Cath 08/2016 - 4/4 patent grafts.  . Carotid bruit    carotid u/s 10/10: 0.39% bilaterally  . Chronic combined systolic and diastolic CHF (congestive heart failure) ([place])   . Complication of anesthesia 1980s   "w/anal cyst OR, he gave me a saddle block then put a narcotic in spinal cord; had a severe reaction to that" (01/07/2017)  . Congestive heart failure (CHF) ([place])   . ED (erectile dysfunction)   . Family history of adverse reaction to anesthesia    "daughter wakes up during OR" (01/07/2017)  . GERD (gastroesophageal reflux disease)   . Gout   . HTN (hypertension)   . Hx of colonic polyps    (sees Dr. Henrene Pastor)  . Hyperlipidemia   . Hypothyroidism   . Moderate to severe aortic stenosis    a. s/p TAVR 02/2017.  [name] Myocardial infarction ([place]) ~ 2000  . Obesity   . Osteoarthritis    "was in my knees, hands" (01/07/2017 )  . PAF (paroxysmal atrial fibrillation) ([place])    a. documented post TAVR.  [name] Precancerous skin lesion    (sees Dr. Allyson Sabal)  . Prostate cancer ([place]) dx'd ~ 2014  . 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 ANUS SURGERY     "opened it back up cause it wouldn't heal; wound up w/a fissure" (01/07/2017)  . BIV ICD INSERTION CRT-D N/A 05/02/2017   Procedure: BIV ICD INSERTION CRT-D;  Surgeon: Deboraha Sprang, MD;  Location: [facility];  Service: Cardiovascular;  Laterality: N/A;  . CARDIAC CATHETERIZATION  10/29/2005  . CARDIAC CATHETERIZATION N/A 08/27/2016   Procedure: Right/Left Heart Cath and Coronary/Graft Angiography;  Surgeon: Burnell Blanks, MD;  Location: [facility];  Service: Cardiovascular;  Laterality: N/A;  . CATARACT  EXTRACTION W/ INTRAOCULAR LENS  IMPLANT, BILATERAL Bilateral   . CATARACT EXTRACTION, BILATERAL  2012  . COLONOSCOPY  06/30/2008   no repeats needed   . COLONOSCOPY     had 3 or 4 in the past   . CORONARY ARTERY BYPASS GRAFT  2007   "CABG X4"  . CYST EXCISION PERINEAL  1980s  . HAMMER TOE SURGERY Bilateral   . JOINT REPLACEMENT    . KNEE ARTHROPLASTY  07/30/2011   Procedure: COMPUTER ASSISTED TOTAL KNEE ARTHROPLASTY;  Surgeon: Meredith Pel;  Location: [facility];  Service: Orthopedics;  Laterality: Left;  left total knee arthroplasty  . MASTECTOMY SUBCUTANEOUS Bilateral   . ORIF FINGER / THUMB FRACTURE Right ~ 1980   "repair of thumb injury"  . PROSTATE BIOPSY    . REPLACEMENT TOTAL KNEE BILATERAL Bilateral 2012  . TEE WITHOUT CARDIOVERSION N/A 03/04/2017   Procedure: TRANSESOPHAGEAL ECHOCARDIOGRAM (TEE);  Surgeon: Burnell Blanks, MD;  Location: [facility];  Service: Open Heart Surgery;  Laterality: N/A;  . TRANSCATHETER AORTIC VALVE REPLACEMENT, TRANSFEMORAL N/A 03/04/2017   Procedure: TRANSCATHETER AORTIC VALVE REPLACEMENT, TRANSFEMORAL;  Surgeon: Burnell Blanks, MD;  Location: [facility];  Service: Open Heart Surgery;  Laterality: N/A;    Current Outpatient Medications  Medication Sig Dispense Refill  . albuterol (PROVENTIL HFA;VENTOLIN HFA) 108 (90 Base) MCG/ACT inhaler Inhale 2 puffs into the lungs every 6 (six) hours as needed for wheezing or shortness of breath. 1 Inhaler 6  . aspirin 81 MG tablet Take 1 tablet (81 mg total) by mouth daily. 30 tablet 0  . atorvastatin (LIPITOR) 20 MG tablet TAKE 1 TABLET ONCE DAILY. 90 tablet 0  . benzonatate (TESSALON) 200 MG capsule TAKE 1 CAPSULE EVERY 8 HOURS AS NEEDED FOR COUGH. 45 capsule 1  . cetirizine (ZYRTEC) 10 MG tablet Take 10 mg by mouth daily.    . chlorpheniramine (CHLOR-TRIMETON) 4 MG tablet Take 8 mg by mouth 2 (two) times daily.     [name] DYMISTA 137-50 MCG/ACT SUSP USE 2 SPRAYS EACH NOSTRIL TWICE DAILY. 23 g 1  .  famotidine (PEPCID) 10 MG tablet Take 10 mg by mouth at bedtime.    . fluconazole (DIFLUCAN) 200 MG tablet Take 1 tablet (200 mg total) by mouth as directed. Take two tablets by mouth on day 1 Take one tablet by mouth for the next 20 days Then Stop. 22 tablet 0  . furosemide (LASIX) 80 MG tablet Take 80 mg daily alternating with 80 mg twice daily. 135 tablet 3  . ipratropium (ATROVENT) 0.03 % nasal spray Place 2 sprays into both nostrils every 6 (six) hours as needed for rhinitis. 30 mL 4  . isosorbide mononitrate (IMDUR) 30 MG 24 hr tablet Take 1 tablet (30 mg total) by mouth daily. 90 tablet 1  . Ketotifen Fumarate (ZADITOR OP) Apply 1 drop to eye  daily as needed (dry eyes).    [name] levothyroxine (SYNTHROID, LEVOTHROID) 75 MCG tablet Take 1 tablet (75 mcg total) by mouth daily. 180 tablet 3  . lidocaine (XYLOCAINE) 2 % solution Use as directed 10 mLs in the mouth or throat every 6 (six) hours as needed (sore throat). 100 mL 2  . losartan (COZAAR) 25 MG tablet TAKE 1 TABLET EACH DAY. 90 tablet 1  . metoprolol tartrate (LOPRESSOR) 25 MG tablet TAKE 1 TABLET BY MOUTH TWICE DAILY. 180 tablet 1  . Multiple Vitamins-Minerals (PRESERVISION AREDS PO) Take 2 tablets by mouth every morning.    [name] omeprazole (PRILOSEC) 20 MG capsule Take 1 capsule (20 mg total) by mouth daily. (Patient taking differently: Take 20 mg by mouth 2 (two) times daily. ) 90 capsule 4  . potassium chloride SA (K-DUR,KLOR-CON) 20 MEQ tablet Take 2 tablets by mouth twice daily alternating with 2 tablets daily. 270 tablet 3  . PRADAXA 150 MG CAPS capsule TAKE (1) CAPSULE TWICE DAILY. 180 capsule 1  . triamcinolone cream (KENALOG) 0.1 % Apply 1 application topically daily as needed for dry skin.    . valACYclovir (VALTREX) 500 MG tablet Take 1 tablet (500 mg total) by mouth 2 (two) times daily. 60 tablet 5   No current facility-administered medications for this visit.     Allergies  Allergen Reactions  . Peanut-Containing Drug  Products Anaphylaxis  . Sulfonamide Derivatives Anaphylaxis  . Amlodipine Swelling    Swelling in ankles  . Eliquis [Apixaban] Other (See Comments)    Back/hip pain  . Lisinopril Cough    cough  . Xarelto [Rivaroxaban] Other (See Comments)    Back/hip pain    Social History   Socioeconomic History  . Marital status: Married    Spouse name: Not on file  . Number of children: 3  . Years of education: Not on file  . Highest education level: Not on file  Occupational History  . Occupation: Clinical biochemist    Comment: builds [employer]  . Financial resource strain: Not on file  . Food insecurity:    Worry: Not on file    Inability: Not on file  . Transportation needs:    Medical: Not on file    Non-medical: Not on file  Tobacco Use  . Smoking status: Former Smoker    Packs/day: 3.50    Years: 13.00    Pack years: 45.50    Types: Cigarettes    Last attempt to quit: 1963    Years since quitting: 56.9  . Smokeless tobacco: Never Used  Substance and Sexual Activity  . Alcohol use: Yes    Alcohol/week: 0.0 standard drinks    Comment: 01/07/2017 "might have 2 beers 2-3 times/month"  . Drug use: No  . Sexual activity: Never  Lifestyle  . Physical activity:    Days per week: Not on file    Minutes per session: Not on file  . Stress: Not on file  Relationships  . Social connections:    Talks on phone: Not on file    Gets together: Not on file    Attends religious service: Not on file    Active member of club or organization: Not on file    Attends meetings of clubs or organizations: Not on file    Relationship status: Not on file  . Intimate partner violence:    Fear of current or ex partner: Not on file    Emotionally abused: Not on file  Physically abused: Not on file    Forced sexual activity: Not on file  Other Topics Concern  . Not on file  Social History Narrative   FH of CAD, Male 1st degree relative less than age 81.    Family History    Problem Relation Age of Onset  . Heart attack Father 37  . Heart failure Mother 10  . Uterine cancer Mother   . Breast cancer Mother   . Colon cancer Neg Hx   . Esophageal cancer Neg Hx     Review of Systems:  As stated in the HPI and otherwise negative.   BP 134/78   Pulse 94   Ht 5\' 8"  (1.727 m)   Wt 256 lb 12.8 oz (116.5 kg)   SpO2 97%   BMI 39.05 kg/m   Physical Examination:  General: Well developed, well nourished, NAD  HEENT: OP clear, mucus membranes moist  SKIN: warm, dry. No rashes. Neuro: No focal deficits  Musculoskeletal: Muscle strength 5/5 all ext  Psychiatric: Mood and affect normal  Neck: No JVD, no carotid bruits, no thyromegaly, no lymphadenopathy.  Lungs:Clear bilaterally, no wheezes, rhonci, crackles Cardiovascular: Regular rate and rhythm. No murmurs, gallops or rubs. Abdomen:Soft. Bowel sounds present. Non-tender.  Extremities: No lower extremity edema. Pulses are 2 + in the bilateral DP/PT.  Echo June 2019: .- Left ventricle: The cavity size was normal. Wall thickness was   increased in a pattern of mild LVH. Systolic function was mildly   to moderately reduced. The estimated ejection fraction was in the   range of 40% to 45%. Diffuse hypokinesis. Features are consistent   with a pseudonormal left ventricular filling pattern, with   concomitant abnormal relaxation and increased filling pressure   (grade 2 diastolic dysfunction). - Aortic valve: Bioprosthetic aortic valve s/p TAVR. There was no   stenosis. There was no regurgitation. Mean gradient (S): 9 mm Hg.   Valve area (VTI): 1.99 cm^2. - Aorta: Ascending aortic diameter: 39 mm (S). - Ascending aorta: The ascending aorta was mildly dilated. - Mitral valve: Mildly calcified annulus. There was no significant   regurgitation. - Left atrium: The atrium was moderately dilated. - Right ventricle: The cavity size was normal. Pacer wire or   catheter noted in right ventricle. Systolic function  was mildly   reduced. - Right atrium: The atrium was mildly dilated. - Tricuspid valve: Peak RV-RA gradient (S): 19 mm Hg. - Inferior vena cava: The vessel was normal in size. The   respirophasic diameter changes were in the normal range (>= 50%),   consistent with normal central venous pressure.  Impressions:  - Poor images, Definity contrast may have helped but was not used.   Normal LV size with mild LV hypertrophy. EF 40-45%, diffuse   hypokinesis. Normal RV size with mildly decreased systolic   function. Bioprosthetic aortic valve s/p TAVR appeared to   function normally.    Cardiac cath 08/27/16: Diagnostic Diagram          EKG:  EKG is not ordered today The ekg ordered today demonstrates  Recent Labs: 12/30/2017: BUN 14; Creatinine, Ser 0.73; Hemoglobin 13.0; Platelets 229.0; Potassium 3.5; Sodium 140; TSH 2.86   Lipid Panel    Component Value Date/Time   CHOL 109 09/25/2016 0828   TRIG 133.0 09/25/2016 0828   TRIG 143 06/30/2006 1132   HDL 29.80 (L) 09/25/2016 0828   CHOLHDL 4 09/25/2016 0828   VLDL 26.6 09/25/2016 0828   LDLCALC 53 09/25/2016 7106  Wt Readings from Last 3 Encounters:  08/10/18 256 lb 12.8 oz (116.5 kg)  08/05/18 261 lb 8 oz (118.6 kg)  08/04/18 262 lb (118.8 kg)     Other studies Reviewed: Additional studies/ records that were reviewed today include:  Review of the above records demonstrates:    Assessment and Plan:   1. CAD s/p CABG with angina: He has no chest pain. Will continue ASA, statin, Imdur and beta blocker.    2. Aortic valve stenosis: He is s/p TAVR in July 2018. He is NYHA class 1-2. Will continue SBE prophylaxis as indicated. He is on Pradaxa.   3. HTN: BP is controlled. No change today  4. HLD: LDL at goal in 2018. Will repeat now. Continue statin  5. Chronic systolic and diastolic CHF: Weight is stable. Continue Lasix.   6. Atrial fibrillation, paroxysmal: He had post-operative atrial fibrillation  following TAVR and recurrence noted at follow up in our office. Sinus today. Will continue Pradaxa and Lopressor.   7. Ischemic cardiomyopathy: ICD in place.   8. Junctional bradycardia: ICD in place.   Current medicines are reviewed at length with the patient today.  The patient does not have concerns regarding medicines.  The following changes have been made:  no change  Labs/ tests ordered today include:   Orders Placed This Encounter  Procedures  . Lipid Profile  . Hepatic function panel   Disposition:  Follow up with me in 6 months.   Signed, [name], MD 08/10/2018 8:43 AM    [facility] [address], [place], [place]  [address] Phone: [phone]; Fax: [phone]

## 2018-08-07 NOTE — Telephone Encounter (Signed)
Copied from [place] [id number]. Topic: Quick Communication - Rx Refill/Question >> Aug 07, 2018  3:54 PM [place], [place] D wrote: Medication: Montelukast Sodium 10 MG / Isosorbide Mononitrate 30 MG / Levothyroxine Sodium 75 MCG / Omeprazole 20 MG / Pt stated pharmacy sent in a request for these medications and they were denied. Pt just had OV with Dr. Sarajane Jews on 08/05/18. Please advise  Has the patient contacted their pharmacy? Yes.   (Agent: If no, request that the patient contact the pharmacy for the refill.) (Agent: If yes, when and what did the pharmacy advise?)  Preferred Pharmacy (with phone number or street name): [place], [place]. [phone] (Phone) [phone] (Fax)    Agent: Please be advised that RX refills may take up to 3 business days. We ask that you follow-up with your pharmacy.

## 2018-08-07 NOTE — Telephone Encounter (Signed)
Requested medication (s) are due for refill today: Yes  Requested medication (s) are on the active medication list: Yes and No to the Singulair  Last refill:  Levothyroxine 08/02/17; Omeprazole 05/29/17; Singulair removed from profile on 05/29/18  Future visit scheduled: No  Notes to clinic:  Unable to refill per protocol     Requested Prescriptions  Pending Prescriptions Disp Refills   levothyroxine (SYNTHROID, LEVOTHROID) 75 MCG tablet 180 tablet 3    Sig: Take 1 tablet (75 mcg total) by mouth daily.     Endocrinology:  Hypothyroid Agents Failed - 08/07/2018  5:43 PM      Failed - TSH needs to be rechecked within 3 months after an abnormal result. Refill until TSH is due.      Passed - TSH in normal range and within 360 days    TSH  Date Value Ref Range Status  12/30/2017 2.86 0.35 - 4.50 uIU/mL Final         Passed - Valid encounter within last 12 months    Recent Outpatient Visits          2 days ago Primary osteoarthritis involving multiple joints   [place] [facility] [place], MD   2 months ago Dysphagia, unspecified type   Therapist, [facility] at [facility], Ishmael Holter, MD   4 months ago Prostatitis, acute   [facility], Ishmael Holter, MD   7 months ago Neuropathy   Therap[facility], Ishmael Holter, MD   1 year ago Essential hypertension   Therapist, [facility], Ishmael Holter, MD      Future Appointments            In 1 week Martyn Ehrich, NP [facility]   In 3 weeks Marlou Sa, Tonna Corner, MD [facility] [place]          montelukast (SINGULAIR) 10 MG tablet 90 tablet 4    Sig: Take 1 tablet (10 mg total) by mouth every evening.     Pulmonology:  Leukotriene Inhibitors Passed - 08/07/2018  5:43 PM      Passed - Valid encounter within last 12 months    Recent Outpatient Visits          2 days ago Primary osteoarthritis involving multiple joints   [place] [facility] [place], MD   2 months ago Dysphagia, unspecified type   Therapist, [facility], Ishmael Holter, MD   4 months ago Prostatitis, acute   Therapist, [facility], Ishmael Holter, MD   7 months ago Neuropathy   Therapist, [facility], Ishmael Holter, MD   1 year ago Essential hypertension   Therapist, [facility] at [facility], Ishmael Holter, MD      Future Appointments            In 1 week Martyn Ehrich, NP [facility]   In 3 weeks Marlou Sa, Tonna Corner, MD [facility]          omeprazole (PRILOSEC) 20 MG capsule      Sig: Take 1 capsule (20 mg total) by mouth 2 (two) times daily.     Gastroenterology: Proton Pump Inhibitors Passed - 08/07/2018  5:43 PM      Passed - Valid encounter within last 12 months    Recent Outpatient Visits          2 days ago Primary osteoarthritis involving multiple joints  Therapist, music at [place], MD   2 months ago Dysphagia, unspecified type   Therapist, music at [place], MD   4 months ago Prostatitis, acute   Therapist, [facility], Ishmael Holter, MD   7 months ago Neuropathy   Therapist, [facility] at [facility], Ishmael Holter, MD   1 year ago Essential hypertension   Therapist, [facility] at [facility], Ishmael Holter, MD      Future Appointments            In 1 week Martyn Ehrich, NP [facility]   In 3 weeks Marlou Sa, Tonna Corner, MD [facility]         Signed Prescriptions Disp Refills   isosorbide mononitrate (IMDUR) 30 MG 24 hr tablet 90 tablet 1    Sig: Take 1 tablet (30 mg total) by mouth daily.     Cardiovascular:  Nitrates Passed - 08/07/2018  5:43 PM      Passed - Last BP in normal range    BP Readings from Last 1 Encounters:  08/05/18 110/60         Passed - Last Heart Rate in normal range    Pulse Readings from Last 1 Encounters:  08/05/18 87          Passed - Valid encounter within last 12 months    Recent Outpatient Visits          2 days ago Primary osteoarthritis involving multiple joints   [place] [facility] [place], MD   2 months ago Dysphagia, unspecified type   Therapist, music at [place], MD   4 months ago Prostatitis, acute   Therapist, [facility] at [facility], Ishmael Holter, MD   7 months ago Neuropathy   Therapist, [facility] at [facility], Ishmael Holter, MD   1 year ago Essential hypertension   Therapist, [facility] at [facility] [employer], Ishmael Holter, MD      Future Appointments            In 1 week Martyn Ehrich, NP [facility]   In 3 weeks Marlou Sa, Tonna Corner, MD [facility]

## 2018-08-10 ENCOUNTER — Ambulatory Visit: Payer: Medicare Other | Admitting: Cardiovascular Disease

## 2018-08-10 ENCOUNTER — Other Ambulatory Visit: Payer: Self-pay | Admitting: Family Medicine

## 2018-08-10 ENCOUNTER — Encounter (HOSPITAL_COMMUNITY): Payer: Self-pay

## 2018-08-10 ENCOUNTER — Encounter: Payer: Self-pay | Admitting: Cardiovascular Disease

## 2018-08-10 VITALS — BP 134/78 | HR 94 | Ht 68.0 in | Wt 256.8 lb

## 2018-08-10 DIAGNOSIS — I350 Nonrheumatic aortic (valve) stenosis: Secondary | ICD-10-CM | POA: Diagnosis not present

## 2018-08-10 DIAGNOSIS — I255 Ischemic cardiomyopathy: Secondary | ICD-10-CM | POA: Diagnosis not present

## 2018-08-10 DIAGNOSIS — I25118 Atherosclerotic heart disease of native coronary artery with other forms of angina pectoris: Secondary | ICD-10-CM | POA: Diagnosis not present

## 2018-08-10 DIAGNOSIS — I480 Paroxysmal atrial fibrillation: Secondary | ICD-10-CM | POA: Diagnosis not present

## 2018-08-10 DIAGNOSIS — I10 Essential (primary) hypertension: Secondary | ICD-10-CM

## 2018-08-10 MED ORDER — OMEPRAZOLE 20 MG PO CPDR: 20.0000 mg | DELAYED_RELEASE_CAPSULE | Freq: Two times a day (BID) | ORAL | 3 refills | Status: DC

## 2018-08-10 MED ORDER — LEVOTHYROXINE SODIUM 75 MCG PO TABS: 75.0000 ug | ORAL_TABLET | Freq: Every day | ORAL | 3 refills | Status: DC

## 2018-08-10 MED ORDER — MONTELUKAST SODIUM 10 MG PO TABS: 10.0000 mg | ORAL_TABLET | Freq: Every evening | ORAL | 4 refills | Status: DC

## 2018-08-10 NOTE — Patient Instructions (Addendum)
Medication Instructions:  Your physician recommends that you continue on your current medications as directed. Please refer to the Current Medication list given to you today.  If you need a refill on your cardiac medications before your next appointment, please call your pharmacy.   Lab work: Your physician recommends that you return for lab work tomorrow.  This will be fasting--Lipid and liver profiles.  The lab opens at 7:30 AM  If you have labs (blood work) drawn today and your tests are completely normal, you will receive your results only by: [name] MyChart Message (if you have MyChart) OR . A paper copy in the mail If you have any lab test that is abnormal or we need to change your treatment, we will call you to review the results.  Testing/Procedures: none  Follow-Up: At [facility], you and your health needs are our priority.  As part of our continuing mission to provide you with exceptional heart care, we have created designated Provider Care Teams.  These Care Teams include your primary Cardiologist (physician) and Advanced Practice Providers (APPs -  Physician Assistants and Nurse Practitioners) who all work together to provide you with the care you need, when you need it. You will need a follow up appointment in 6 months.  Please call our office 2 months in advance to schedule this appointment.  You may see [name], MD or one of the following Advanced Practice Providers on your designated Care Team:   [place], PA-C Melina Copa, PA-C . Ermalinda Barrios, PA-C  Any Other Special Instructions Will Be Listed Below (If Applicable).

## 2018-08-11 ENCOUNTER — Other Ambulatory Visit: Payer: Medicare Other

## 2018-08-11 DIAGNOSIS — I25118 Atherosclerotic heart disease of native coronary artery with other forms of angina pectoris: Secondary | ICD-10-CM | POA: Diagnosis not present

## 2018-08-11 LAB — LIPID PANEL
Chol/HDL Ratio: 5 ratio (ref 0.0–5.0)
Cholesterol, Total: 171 mg/dL (ref 100–199)
HDL: 34 mg/dL — ABNORMAL LOW (ref >39)
LDL Calculated: 73 mg/dL (ref 0–99)
Triglycerides: 321 mg/dL — ABNORMAL HIGH (ref 0–149)
VLDL Cholesterol Cal: 64 mg/dL — ABNORMAL HIGH (ref 5–40)

## 2018-08-11 LAB — HEPATIC FUNCTION PANEL
ALT: 15 IU/L (ref 0–44)
AST: 16 IU/L (ref 0–40)
Albumin: 3.8 g/dL (ref 3.5–4.7)
Alkaline Phosphatase: 72 IU/L (ref 39–117)
Bilirubin Total: 0.5 mg/dL (ref 0.0–1.2)
Bilirubin, Direct: 0.13 mg/dL (ref 0.00–0.40)
Total Protein: 6.8 g/dL (ref 6.0–8.5)

## 2018-08-12 ENCOUNTER — Encounter (HOSPITAL_COMMUNITY): Payer: Self-pay

## 2018-08-13 ENCOUNTER — Telehealth: Payer: Self-pay | Admitting: Cardiovascular Disease

## 2018-08-13 NOTE — Telephone Encounter (Signed)
I spoke with pt and reviewed lipid and liver results with him.

## 2018-08-13 NOTE — Telephone Encounter (Signed)
° ° °  Please return call to patient regarding labs

## 2018-08-14 ENCOUNTER — Encounter (HOSPITAL_COMMUNITY)
Admission: RE | Admit: 2018-08-14 | Discharge: 2018-08-14 | Disposition: A | Discharge status: Home / Self Care | Payer: Self-pay | Source: Ambulatory Visit | Attending: Cardiovascular Disease | Admitting: Cardiovascular Disease

## 2018-08-14 ENCOUNTER — Encounter (HOSPITAL_COMMUNITY): Payer: Self-pay

## 2018-08-17 ENCOUNTER — Encounter (HOSPITAL_COMMUNITY)
Admission: RE | Admit: 2018-08-17 | Discharge: 2018-08-17 | Disposition: A | Discharge status: Home / Self Care | Payer: Self-pay | Source: Ambulatory Visit | Attending: Cardiovascular Disease | Admitting: Cardiovascular Disease

## 2018-08-17 ENCOUNTER — Encounter (HOSPITAL_COMMUNITY): Payer: Self-pay

## 2018-08-18 ENCOUNTER — Encounter: Payer: Self-pay | Admitting: Primary Care

## 2018-08-18 ENCOUNTER — Ambulatory Visit (INDEPENDENT_AMBULATORY_CARE_PROVIDER_SITE_OTHER): Payer: Medicare Other | Admitting: Primary Care

## 2018-08-18 VITALS — BP 112/72 | HR 95 | Ht 68.0 in | Wt 263.6 lb

## 2018-08-18 DIAGNOSIS — R05 Cough: Secondary | ICD-10-CM | POA: Diagnosis not present

## 2018-08-18 DIAGNOSIS — R059 Cough, unspecified: Secondary | ICD-10-CM

## 2018-08-18 LAB — NITRIC OXIDE: Nitric Oxide: 43

## 2018-08-18 MED ORDER — BUDESONIDE-FORMOTEROL FUMARATE 80-4.5 MCG/ACT IN AERO: 2.0000 | INHALATION_SPRAY | Freq: Two times a day (BID) | RESPIRATORY_TRACT | 0 refills | Status: DC

## 2018-08-18 MED ORDER — HYDROCODONE-HOMATROPINE 5-1.5 MG/5ML PO SYRP: 5.0000 mL | ORAL_SOLUTION | Freq: Every evening | ORAL | 0 refills | Status: DC | PRN

## 2018-08-18 MED ORDER — BENZONATATE 200 MG PO CAPS: ORAL_CAPSULE | ORAL | 1 refills | Status: DC

## 2018-08-18 NOTE — Patient Instructions (Addendum)
Symbicort as needed for wheezing or shortness of breath Use spacer   Tessalon perles three times a day for cough  Delsym cough syrup 5-46ml every 12 hours  Hycodan at bedtime as needed for cough- do not drive while taking or combine with other sedating medication    Follow up in 6-8 weeks with [name] or Eustaquio Maize NP

## 2018-08-18 NOTE — Progress Notes (Signed)
@Patient  ID: Justin [name], male    DOB: 08-18-1934, 84 y.o.   MRN: [id number]  Chief Complaint  Patient presents with  . Follow-up    Cough, still a dry cough, no SOB, aleegy medication helps     Referring provider: Laurey Morale, MD   Synopsis: Former patient of Dr. Gwenette Greet who has irritable larynx syndrome as well as mild persistent asthma. May 2017 CT scan of the sinuses was a poor study, there is no clear acute or chronic sinusitis noted. Previously treated with immunotherapy by Dr. Velora Heckler many years prior.    HPI: 84 year old male, former smoker.  Past medical history significant for irritable larynx syndrome, mild persistent asthma and GERD.  Patient of [name], last seen 05/21/2018.  During that visit patient complained of sore throat and cough which was treated with Z-Pak and as needed Tessalon Perles.  Maintained on Symbicort puffs twice daily.  Continues dymista, Zyrtec and Singulair.  Receives cardiac rehab as maintenance.  08/05/2018 Presents today for an acute visit with complaints of dry cough x 6 weeks. If he brings up mucus its clear. Associated post nasal drip and throat clearing. Shows me pictures from recent endoscopy that showed recurrent esophageal candidiasis, treated with diflucan and lidocaine. States that he feels its cleared up. He is taking 40mg  omeprazole in the morning and pepcid at bedtime. Feels this regimen in working. There is a concern that it is coming from his maintenance inhaler. He rinses his mouth after use. States that he stopped Symbicort for 7-10 days and did not have a problem being off it. Denies wheezing or sob. He does not have a rescue inhaler. FENO was 45 today.   Continues to have post nasal drip symptoms which he feels triggers his cough. Using Dymista twice a day and states that its fantastic. Taking Tessalon perles as needed, has 1 refill. Takes Antihistamine in the morning, works all day but has sinus drainage around 4 am. Asking if  he can use an old prescription for Ipratropium nasal spray. Requesting refill zpack to take as needed, has used this appropriately in the past.   08/18/2018  Patient presents today for 2 week office visit for chronic cough and PND symptoms. Symbicort changed to as needed only. Breathing is fine, he has not require Symbicort or rescue inhaler. Denies wheeze or shortness of breath. Continues to have dry cough. Inhalers and prednisone do not seem to make a difference. Tessalon perles do help some. Needs to refill dymista prescription. He has had allergy shots in the past, they do help but takes a long time.   Testing: 08/04/18 Spirometry FVC 2.6 (73), FEV1 2.0 (78%), ratio 74  01/28/17 PFTS - FVC 2.59 (73%), FEV1 2.13 (86%), ratio 82, DLCOunc 57 05/19- Eos relative 0.4 04/18 - IgE 180    Allergies  Allergen Reactions  . Peanut-Containing Drug Products Anaphylaxis  . Sulfonamide Derivatives Anaphylaxis  . Amlodipine Swelling    Swelling in ankles  . Eliquis [Apixaban] Other (See Comments)    Back/hip pain  . Lisinopril Cough    cough  . Xarelto [Rivaroxaban] Other (See Comments)    Back/hip pain    Immunization History  Administered Date(s) Administered  . Hep A / Hep B 04/06/2012, 10/07/2012  . Hepatitis B 05/07/2012  . Influenza Split 06/28/2013  . Influenza Whole 06/05/2007, 05/13/2008, 06/06/2009, 05/30/2010  . Influenza, High Dose Seasonal PF 05/15/2018  . Influenza,inj,Quad PF,6+ Mos 05/20/2016  . Influenza-Unspecified 05/25/2014, 07/08/2015, 05/06/2017  .  Pneumococcal Conjugate-13 09/18/2015  . Pneumococcal Polysaccharide-23 05/13/2008, 05/25/2014  . Td 05/30/2010  . Zoster 10/21/2007  . Zoster Recombinat (Shingrix) 05/18/2018    Past Medical History:  Diagnosis Date  . Age-related macular degeneration, dry, both eyes   . Allergic    "24/7; 365 days/year; I'm allergic to pollens, dust, all southern grasses/trees, mold, mildue, cats, dogs" (01/07/2017)  . Anal fissure     . Asthma    sees [name]   . Benign prostatic hypertrophy    (sees Dr. Denman George  . CAD (coronary artery disease)    a. s/p CABG 2007. b. Cath 08/2016 - 4/4 patent grafts.  . Carotid bruit    carotid u/s 10/10: 0.39% bilaterally  . Chronic combined systolic and diastolic CHF (congestive heart failure) ([place])   . Complication of anesthesia 1980s   "w/anal cyst OR, he gave me a saddle block then put a narcotic in spinal cord; had a severe reaction to that" (01/07/2017)  . Congestive heart failure (CHF) ([place])   . ED (erectile dysfunction)   . Family history of adverse reaction to anesthesia    "daughter wakes up during OR" (01/07/2017)  . GERD (gastroesophageal reflux disease)   . Gout   . HTN (hypertension)   . Hx of colonic polyps    (sees Dr. Henrene Pastor)  . Hyperlipidemia   . Hypothyroidism   . Moderate to severe aortic stenosis    a. s/p TAVR 02/2017.  [name] Myocardial infarction ([place]) ~ 2000  . Obesity   . Osteoarthritis    "was in my knees, hands" (01/07/2017 )  . PAF (paroxysmal atrial fibrillation) ([place])    a. documented post TAVR.  [name] Precancerous skin lesion    (sees Dr. Allyson Sabal)  . Prostate cancer ([place]) dx'd ~ 2014  . 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 Packs/day: 3.50  . Years: 13.00  . Pack years: 45.50  . Types: Cigarettes  . Last attempt to quit: 1963  . Years since quitting: 57.0  Smokeless Tobacco Never Used   Counseling given: Not Answered   Outpatient Medications Prior to Visit  Medication Sig Dispense Refill  . albuterol (PROVENTIL HFA;VENTOLIN HFA) 108 (90 Base) MCG/ACT inhaler Inhale 2 puffs into the lungs every 6 (six) hours as needed for wheezing or shortness of breath. 1 Inhaler 6  . aspirin 81 MG tablet Take 1  tablet (81 mg total) by mouth daily. 30 tablet 0  . atorvastatin (LIPITOR) 20 MG tablet TAKE 1 TABLET ONCE DAILY. 90 tablet 0  . cetirizine (ZYRTEC) 10 MG tablet Take 10 mg by mouth daily.    . chlorpheniramine (CHLOR-TRIMETON) 4 MG tablet Take 8 mg by mouth 2 (two) times daily.     [name] DYMISTA 137-50 MCG/ACT SUSP USE 2 SPRAYS EACH NOSTRIL TWICE DAILY. 23 g 1  . famotidine (PEPCID) 10 MG tablet Take 10 mg by mouth at bedtime.    . fluconazole (DIFLUCAN) 200 MG tablet Take 1 tablet (200 mg total) by mouth as directed. Take two tablets by mouth on day 1 Take one tablet by mouth for the next 20 days Then Stop. 22 tablet 0  . furosemide (LASIX) 80 MG tablet Take 80 mg daily alternating with 80 mg twice daily. 135 tablet  3  . ipratropium (ATROVENT) 0.03 % nasal spray Place 2 sprays into both nostrils every 6 (six) hours as needed for rhinitis. 30 mL 4  . isosorbide mononitrate (IMDUR) 30 MG 24 hr tablet Take 1 tablet (30 mg total) by mouth daily. 90 tablet 1  . Ketotifen Fumarate (ZADITOR OP) Apply 1 drop to eye daily as needed (dry eyes).    [name] levothyroxine (SYNTHROID, LEVOTHROID) 75 MCG tablet Take 1 tablet (75 mcg total) by mouth daily. 90 tablet 3  . lidocaine (XYLOCAINE) 2 % solution Use as directed 10 mLs in the mouth or throat every 6 (six) hours as needed (sore throat). 100 mL 2  . losartan (COZAAR) 25 MG tablet TAKE 1 TABLET EACH DAY. 90 tablet 1  . metoprolol tartrate (LOPRESSOR) 25 MG tablet TAKE 1 TABLET BY MOUTH TWICE DAILY. 180 tablet 1  . montelukast (SINGULAIR) 10 MG tablet Take 1 tablet (10 mg total) by mouth every evening. 90 tablet 4  . Multiple Vitamins-Minerals (PRESERVISION AREDS PO) Take 2 tablets by mouth every morning.    [name] omeprazole (PRILOSEC) 20 MG capsule Take 1 capsule (20 mg total) by mouth 2 (two) times daily. 90 capsule 3  . potassium chloride SA (K-DUR,KLOR-CON) 20 MEQ tablet Take 2 tablets by mouth twice daily alternating with 2 tablets daily. 270 tablet 3  . PRADAXA  150 MG CAPS capsule TAKE (1) CAPSULE TWICE DAILY. 180 capsule 1  . triamcinolone cream (KENALOG) 0.1 % Apply 1 application topically daily as needed for dry skin.    . valACYclovir (VALTREX) 500 MG tablet Take 1 tablet (500 mg total) by mouth 2 (two) times daily. 60 tablet 5  . benzonatate (TESSALON) 200 MG capsule TAKE 1 CAPSULE EVERY 8 HOURS AS NEEDED FOR COUGH. 45 capsule 1   No facility-administered medications prior to visit.     Review of Systems  Review of Systems  Constitutional: Negative.   HENT: Positive for postnasal drip.   Respiratory: Positive for cough. Negative for chest tightness, shortness of breath and wheezing.   Cardiovascular: Negative.   Gastrointestinal: Negative.     Physical Exam  BP 112/72 (BP Location: Left Arm, Cuff Size: Large)   Pulse 95   Ht 5\' 8"  (1.727 m)   Wt 263 lb 9.6 oz (119.6 kg)   SpO2 98%   BMI 40.08 kg/m  Physical Exam Constitutional:      Appearance: He is well-developed. He is obese.  HENT:     Head: Normocephalic and atraumatic.  Eyes:     Pupils: Pupils are equal, round, and reactive to light.  Neck:     Musculoskeletal: Normal range of motion and neck supple.  Cardiovascular:     Rate and Rhythm: Normal rate and regular rhythm.     Heart sounds: Normal heart sounds.  Pulmonary:     Effort: Pulmonary effort is normal. No respiratory distress.     Breath sounds: Normal breath sounds. No wheezing.     Comments: CTA  Abdominal:     General: Bowel sounds are normal.     Palpations: Abdomen is soft.     Tenderness: There is no abdominal tenderness.  Skin:    General: Skin is warm and dry.     Findings: No erythema or rash.  Neurological:     Mental Status: He is alert and oriented to person, place, and time.  Psychiatric:        Behavior: Behavior normal.        Judgment: Judgment  normal.      Lab Results:  CBC    Component Value Date/Time   WBC 10.7 (H) 12/30/2017 1633   RBC 4.79 12/30/2017 1633   HGB 13.0  12/30/2017 1633   HGB 13.9 04/23/2017 1012   HCT 39.7 12/30/2017 1633   HCT 42.1 04/23/2017 1012   PLT 229.0 12/30/2017 1633   PLT 241 04/23/2017 1012   MCV 82.9 12/30/2017 1633   MCV 84 04/23/2017 1012   MCH 27.9 04/23/2017 1012   MCH 27.0 03/07/2017 0231   MCHC 32.7 12/30/2017 1633   RDW 16.4 (H) 12/30/2017 1633   RDW 15.8 (H) 04/23/2017 1012   LYMPHSABS 2.2 12/30/2017 1633   LYMPHSABS 2.0 04/23/2017 1012   MONOABS 1.1 (H) 12/30/2017 1633   EOSABS 0.4 12/30/2017 1633   EOSABS 0.3 04/23/2017 1012   BASOSABS 0.2 (H) 12/30/2017 1633   BASOSABS 0.1 04/23/2017 1012    BMET    Component Value Date/Time   NA 140 12/30/2017 1633   NA 142 04/23/2017 1012   K 3.5 12/30/2017 1633   CL 104 12/30/2017 1633   CO2 25 12/30/2017 1633   GLUCOSE 123 (H) 12/30/2017 1633   GLUCOSE 87 06/30/2006 1132   BUN 14 12/30/2017 1633   BUN 13 04/23/2017 1012   CREATININE 0.73 12/30/2017 1633   CREATININE 0.74 08/14/2016 1311   CALCIUM 8.3 (L) 12/30/2017 1633   GFRNONAA 87 04/23/2017 1012   GFRAA 101 04/23/2017 1012    BNP    Component Value Date/Time   BNP 653.3 (H) 01/07/2017 1705    ProBNP No results found for: PROBNP  Imaging: No results found.   Assessment & Plan:   Asthma, intermittent - Continues to have chronic cough with no wheezing or dyspnea  - No noticeable difference with oral steroid course or inhalers  - Unable to take regular ICS d/t recurrent esophageal candidiasis - Previous spirometry and pfts showed some restriction, no obstruction. Mild flow loop curvature on 2018 PFTS. DLCO defect at 57%.  - CXR's have been clear, may consider HRCT - Previous elevated Eos and IgE, could also consider Xolair or dupixent d/t chronic sinusitis  - For now maximize cough regimen with delsym q12hr, prn tessalon and hycodan qhs - Given Xopenex neb treatment today in office to see if helps with coughing symptoms  - FU in 6-8 weeks  Testing: 08/04/18 Spirometry FVC 2.6 (73%), FEV1  2.0 (78%), ratio 74  01/28/17 PFTS - FVC 2.59 (73%), FEV1 2.13 (86%), ratio 82, DLCOunc 57 05/19- Eos relative 0.4 04/18 - IgE 180  08/18/2018- FENO 45    Martyn Ehrich, NP 08/18/2018

## 2018-08-18 NOTE — Assessment & Plan Note (Addendum)
-   Continues to have chronic cough with no wheezing or dyspnea  - No noticeable difference with oral steroid course or inhalers  - Unable to take regular ICS d/t recurrent esophageal candidiasis - Previous spirometry and pfts showed some restriction, no obstruction. Mild flow loop curvature on 2018 PFTS. DLCO defect at 57%.  - CXR's have been clear, may consider HRCT - Previous elevated Eos and IgE, could also consider Xolair or dupixent d/t chronic sinusitis  - For now maximize cough regimen with delsym q12hr, prn tessalon and hycodan qhs - Given Xopenex neb treatment today in office to see if helps with coughing symptoms  - FU in 6-8 weeks  Testing: 08/04/18 Spirometry FVC 2.6 (73%), FEV1 2.0 (78%), ratio 74  01/28/17 PFTS - FVC 2.59 (73%), FEV1 2.13 (86%), ratio 82, DLCOunc 57 05/19- Eos relative 0.4 04/18 - IgE 180  08/18/2018- FENO 43

## 2018-08-21 ENCOUNTER — Encounter (HOSPITAL_COMMUNITY)
Admission: RE | Admit: 2018-08-21 | Discharge: 2018-08-21 | Disposition: A | Discharge status: Home / Self Care | Payer: Self-pay | Source: Ambulatory Visit | Attending: Cardiovascular Disease | Admitting: Cardiovascular Disease

## 2018-08-21 ENCOUNTER — Encounter (HOSPITAL_COMMUNITY): Payer: Self-pay

## 2018-08-24 ENCOUNTER — Encounter (HOSPITAL_COMMUNITY)
Admission: RE | Admit: 2018-08-24 | Discharge: 2018-08-24 | Disposition: A | Discharge status: Home / Self Care | Payer: Self-pay | Source: Ambulatory Visit | Attending: Cardiovascular Disease | Admitting: Cardiovascular Disease

## 2018-08-24 ENCOUNTER — Encounter (HOSPITAL_COMMUNITY): Payer: Self-pay

## 2018-08-26 NOTE — Progress Notes (Signed)
Reviewed, agree 

## 2018-08-27 ENCOUNTER — Telehealth: Payer: Self-pay | Admitting: Primary Care

## 2018-08-27 NOTE — Telephone Encounter (Signed)
Please refer to allergy,

## 2018-08-28 ENCOUNTER — Telehealth: Payer: Self-pay | Admitting: Primary Care

## 2018-08-28 ENCOUNTER — Ambulatory Visit (INDEPENDENT_AMBULATORY_CARE_PROVIDER_SITE_OTHER): Payer: Medicare Other | Admitting: Orthopedic Surgery

## 2018-08-28 ENCOUNTER — Encounter (HOSPITAL_COMMUNITY)
Admission: RE | Admit: 2018-08-28 | Discharge: 2018-08-28 | Disposition: A | Discharge status: Home / Self Care | Payer: Self-pay | Source: Ambulatory Visit | Attending: Cardiovascular Disease | Admitting: Cardiovascular Disease

## 2018-08-28 ENCOUNTER — Ambulatory Visit (INDEPENDENT_AMBULATORY_CARE_PROVIDER_SITE_OTHER): Payer: Medicare Other

## 2018-08-28 ENCOUNTER — Encounter (HOSPITAL_COMMUNITY): Payer: Self-pay

## 2018-08-28 ENCOUNTER — Encounter (INDEPENDENT_AMBULATORY_CARE_PROVIDER_SITE_OTHER): Payer: Self-pay | Admitting: Orthopedic Surgery

## 2018-08-28 ENCOUNTER — Other Ambulatory Visit: Payer: Self-pay

## 2018-08-28 DIAGNOSIS — G8929 Other chronic pain: Secondary | ICD-10-CM

## 2018-08-28 DIAGNOSIS — M25561 Pain in right knee: Secondary | ICD-10-CM

## 2018-08-28 DIAGNOSIS — J309 Allergic rhinitis, unspecified: Secondary | ICD-10-CM

## 2018-08-28 DIAGNOSIS — I350 Nonrheumatic aortic (valve) stenosis: Secondary | ICD-10-CM | POA: Insufficient documentation

## 2018-08-28 DIAGNOSIS — M869 Osteomyelitis, unspecified: Secondary | ICD-10-CM

## 2018-08-28 DIAGNOSIS — Z952 Presence of prosthetic heart valve: Secondary | ICD-10-CM | POA: Insufficient documentation

## 2018-08-28 NOTE — Telephone Encounter (Signed)
-----   Message from Juanito Doom, MD sent at 08/26/2018  6:24 PM EST ----- Oh wait, just send to allergy, if not dyspneic then HRCT of little value ----- Message ----- From: Martyn Ehrich, NP Sent: 08/18/2018   9:46 AM EST To: Juanito Doom, MD  This is a pleasant patient of your with a hx mild asthma, irritable larynx syndrome, GERD and chronic cough. Saw him today for a 2 week follow up for cough and PND. Last time I saw him he told me he was being treated for recurrent esophageal candidiasis likely related to ICS. Treated with two courses of diflucan. FENO was 45, gave him prednisone course. Symbicort changed to as needed only. He has never felt that inhalers or prednisone have been helpful. He denies sob or wheezing. Lungs are clear on exam. Repeat FENO today 43. Last eos was 400, IgE 180. He's had a normal CT sinus. He's been on immunotherapy in the past. CXR's have been clear but no CT imaging. No obstruction on PFTs or spirometry.   I had considered trying a LAMA, but there is no obstruction on PFTs (mild flow loop curvature) so it would be off label use. He is former smoker but quit 60 some years ago. Considering an HRCT but wanted to check with you first. Not sure if allergy referral would be beneficial either. Do you have any additional thoughts? No rush, I'm having him come back in 6-8 weeks.   -[name]

## 2018-08-28 NOTE — Telephone Encounter (Signed)
Please refer to Allergy per [name] recommendations. Re: allergic rhinitis

## 2018-08-28 NOTE — Telephone Encounter (Signed)
LMOM to return call.

## 2018-08-28 NOTE — Progress Notes (Signed)
Office Visit Note   Patient: Justin [name]           Date of Birth: 12/07/1933           MRN: [id number] Visit Date: 08/28/2018 Requested by: Justin Morale, MD [place], [place] [address] PCP: Justin Morale, MD  Subjective: Chief Complaint  Patient presents with  . Right Knee - Pain    HPI: win Justin [name] is a patient with right knee pain.  Had right total knee replacement in 2011.  He is done well but he describes discrete pain when going up stairs.  When he gets to the top of the stairs no problem.  Also has pain going down stairs.  Walking is no problem.  Only has pain in the right knee.  Denies any fevers or chills.  He is on multiple medications including Pradaxa as a blood thinner.              ROS: All systems reviewed are negative as they relate to the chief complaint within the history of present illness.  Patient denies  fevers or chills.   Assessment & Plan: Visit Diagnoses:  1. Chronic pain of right knee   2. Osteomyelitis, unspecified site, unspecified type ([place])     Plan: Impression is right knee pain which is patellar oriented.  I think that that patella button may be loose.  Plain radiographs today do show potential change in loosening at that prosthetic cement interface.  Remainder of the knee prosthesis appears to be intact.  I would like to obtain a bone scan of that right knee to evaluate for patellar loosening.  Could consider revision since that really seems to be his only problem.  I will see him back after the bone scan.  Follow-Up Instructions: No follow-ups on file.   Orders:  Orders Placed This Encounter  Procedures  . XR Knee 1-2 Views Right  . NM Bone Scan 3 Phase   No orders of the defined types were placed in this encounter.     Procedures: No procedures performed   Clinical Data: No additional findings.  Objective: Vital Signs: There were no vitals taken for this visit.  Physical Exam:   Constitutional:  Patient appears well-developed HEENT:  Head: Normocephalic Eyes:EOM are normal Neck: Normal range of motion Cardiovascular: Normal rate Pulmonary/chest: Effort normal Neurologic: Patient is alert Skin: Skin is warm Psychiatric: Patient has normal mood and affect    Ortho Exam: Ortho exam demonstrates normal gait alignment.  Excellent range of motion of both knees.  No effusion in either knee.  No warmth to either knee.  Pedal pulses palpable.  Extensor mechanism is intact and nontender.  He does have some pain on the lateral aspect of the patella with flexion in stairclimbing imitation.  Specialty Comments:  No specialty comments available.  Imaging: Xr Knee 1-2 Views Right  Result Date: 08/28/2018 AP lateral merchant right knee reviewed.  Cemented total knee prosthesis in good position alignment with no complicating features.  There does appear to be evidence of potential patellar button loosening seen on the lateral view.  This is compared with radiographs from 2 years ago.    PMFS History: Patient Active Problem List   Diagnosis Date Noted  . S/P ICD (internal cardiac defibrillator) procedure 05/03/2017  . NICM (nonischemic cardiomyopathy) ([place]) 05/02/2017  . 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 Severe aortic valve stenosis 03/04/2017  . Hypokalemia due to loss of  potassium with diuresis 01/10/2017  . Aortic stenosis 01/10/2017  . Systolic heart failure ([place]) 01/10/2017  . Acute on chronic diastolic CHF (congestive heart failure) ([place]) 01/07/2017  . Hypothyroidism 12/23/2016  . Prostate cancer ([place])   . 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 Edema of both legs 09/01/2014  . Asthma, intermittent 09/01/2014  . Aortic valve disorders 01/01/2011  . Coronary artery disease involving native coronary artery of native heart without angina pectoris   . ACUTE BRONCHITIS 09/06/2010  . GOUT 09/12/2009  . CELLULITIS, FOOT 08/15/2009  . CAD, AUTOLOGOUS  BYPASS GRAFT 05/10/2009  . MURMUR 05/08/2009  . BRUIT 05/08/2009  . MUSCLE CRAMPS 12/12/2008  . KNEE SPRAIN 09/21/2008  . BENIGN PROSTATIC HYPERTROPHY 07/07/2008  . Osteoarthritis 07/07/2008  . CONTUSION, HIP 05/13/2008  . BENIGN PROSTATIC HYPERTROPHY, WITH OBSTRUCTION 02/01/2008  . LOW BACK PAIN SYNDROME 02/01/2008  . TESTOSTERONE DEFICIENCY 07/03/2007  . HYPERLIPIDEMIA 07/03/2007  . Essential hypertension 07/03/2007  . MYOCARDIAL INFARCTION, HX OF 07/03/2007  . Allergic rhinitis 07/03/2007  . ACTINIC KERATOSIS 07/03/2007  . COLONIC POLYPS, HX OF 07/03/2007  . ACNE ROSACEA, HX OF 07/03/2007  . 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   Past Medical History:  Diagnosis Date  . Age-related macular degeneration, dry, both eyes   . Allergic    "24/7; 365 days/year; I'm allergic to pollens, dust, all southern grasses/trees, mold, mildue, cats, dogs" (01/07/2017)  . Anal fissure   . Asthma    sees [name]   . Benign prostatic hypertrophy    (sees Dr. Denman George  . CAD (coronary artery disease)    a. s/p CABG 2007. b. Cath 08/2016 - 4/4 patent grafts.  . Carotid bruit    carotid u/s 10/10: 0.39% bilaterally  . Chronic combined systolic and diastolic CHF (congestive heart failure) ([place])   . Complication of anesthesia 1980s   "w/anal cyst OR, he gave me a saddle block then put a narcotic in spinal cord; had a severe reaction to that" (01/07/2017)  . Congestive heart failure (CHF) ([place])   . ED (erectile dysfunction)   . Family history of adverse reaction to anesthesia    "daughter wakes up during OR" (01/07/2017)  . GERD (gastroesophageal reflux disease)   . Gout   . HTN (hypertension)   . Hx of colonic polyps    (sees Dr. Henrene Pastor)  . Hyperlipidemia   . Hypothyroidism   . Moderate to severe aortic stenosis    a. s/p TAVR 02/2017.  [name] Myocardial infarction ([place]) ~ 2000  . Obesity   . Osteoarthritis    "was in my knees, hands" (01/07/2017 )  . PAF (paroxysmal atrial fibrillation) ([place])    a.  documented post TAVR.  [name] Precancerous skin lesion    (sees Dr. Allyson Sabal)  . Prostate cancer ([place]) dx'd ~ 2014  . 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 Heart attack Father 26  . Heart failure Mother 67  . Uterine cancer Mother   . Breast cancer Mother   . Colon cancer Neg Hx   . Esophageal cancer Neg Hx     Past Surgical History:  Procedure Laterality Date  . ANUS SURGERY     "opened it back up cause it wouldn't heal; wound up w/a fissure" (01/07/2017)  . BIV ICD INSERTION CRT-D N/A  05/02/2017   Procedure: BIV ICD INSERTION CRT-D;  Surgeon: Deboraha Sprang, MD;  Location: [facility];  Service: Cardiovascular;  Laterality: N/A;  . CARDIAC CATHETERIZATION  10/29/2005  . CARDIAC CATHETERIZATION N/A 08/27/2016   Procedure: Right/Left Heart Cath and Coronary/Graft Angiography;  Surgeon: Burnell Blanks, MD;  Location: [facility];  Service: Cardiovascular;  Laterality: N/A;  . CATARACT EXTRACTION W/ INTRAOCULAR LENS  IMPLANT, BILATERAL Bilateral   . CATARACT EXTRACTION, BILATERAL  2012  . COLONOSCOPY  06/30/2008   no repeats needed   . COLONOSCOPY     had 3 or 4 in the past   . CORONARY ARTERY BYPASS GRAFT  2007   "CABG X4"  . CYST EXCISION PERINEAL  1980s  . HAMMER TOE SURGERY Bilateral   . JOINT REPLACEMENT    . KNEE ARTHROPLASTY  07/30/2011   Procedure: COMPUTER ASSISTED TOTAL KNEE ARTHROPLASTY;  Surgeon: Meredith Pel;  Location: [facility];  Service: Orthopedics;  Laterality: Left;  left total knee arthroplasty  . MASTECTOMY SUBCUTANEOUS Bilateral   . ORIF FINGER / THUMB FRACTURE Right ~ 1980   "repair of thumb injury"  . PROSTATE BIOPSY    . REPLACEMENT TOTAL KNEE BILATERAL Bilateral 2012  . TEE WITHOUT CARDIOVERSION N/A 03/04/2017   Procedure: TRANSESOPHAGEAL  ECHOCARDIOGRAM (TEE);  Surgeon: Burnell Blanks, MD;  Location: [facility];  Service: Open Heart Surgery;  Laterality: N/A;  . TRANSCATHETER AORTIC VALVE REPLACEMENT, TRANSFEMORAL N/A 03/04/2017   Procedure: TRANSCATHETER AORTIC VALVE REPLACEMENT, TRANSFEMORAL;  Surgeon: Burnell Blanks, MD;  Location: [facility];  Service: Open Heart Surgery;  Laterality: N/A;   Social History   Occupational History  . Occupation: Clinical biochemist    Comment: builds malls  Tobacco Use  . Smoking status: Former Smoker    Packs/day: 3.50    Years: 13.00    Pack years: 45.50    Types: Cigarettes    Last attempt to quit: 1963    Years since quitting: 57.0  . Smokeless tobacco: Never Used  Substance and Sexual Activity  . Alcohol use: Yes    Alcohol/week: 0.0 standard drinks    Comment: 01/07/2017 "might have 2 beers 2-3 times/month"  . Drug use: No  . Sexual activity: Never

## 2018-08-31 ENCOUNTER — Encounter (HOSPITAL_COMMUNITY): Payer: Self-pay

## 2018-08-31 ENCOUNTER — Encounter (HOSPITAL_COMMUNITY)
Admission: RE | Admit: 2018-08-31 | Discharge: 2018-08-31 | Disposition: A | Discharge status: Home / Self Care | Payer: Self-pay | Source: Ambulatory Visit | Attending: Cardiovascular Disease | Admitting: Cardiovascular Disease

## 2018-09-02 ENCOUNTER — Encounter (HOSPITAL_COMMUNITY): Payer: Self-pay

## 2018-09-02 NOTE — Telephone Encounter (Signed)
Has allergy appt on 09/24/18

## 2018-09-04 ENCOUNTER — Encounter (HOSPITAL_COMMUNITY): Payer: Self-pay

## 2018-09-04 ENCOUNTER — Encounter (HOSPITAL_COMMUNITY)
Admission: RE | Admit: 2018-09-04 | Discharge: 2018-09-04 | Disposition: A | Discharge status: Home / Self Care | Payer: Self-pay | Source: Ambulatory Visit | Attending: Cardiovascular Disease | Admitting: Cardiovascular Disease

## 2018-09-06 LAB — CUP PACEART REMOTE DEVICE CHECK
Battery Remaining Longevity: 68 mo
Battery Voltage: 2.98 V
Brady Statistic AP VP Percent: 0.98 %
Brady Statistic AP VS Percent: 0.08 %
Brady Statistic AS VP Percent: 94.48 %
Brady Statistic RA Percent Paced: 1.03 %
Brady Statistic RV Percent Paced: 69.8 %
Date Time Interrogation Session: 20191113225336
HighPow Impedance: 71 Ohm
Implantable Lead Implant Date: 20180907
Implantable Lead Implant Date: 20180907
Implantable Lead Location: 753858
Implantable Lead Location: 753859
Implantable Lead Location: 753860
Implantable Lead Model: 4396
Implantable Lead Model: 5076
Implantable Pulse Generator Implant Date: 20180907
Lead Channel Impedance Value: 380 Ohm
Lead Channel Impedance Value: 494 Ohm
Lead Channel Impedance Value: 570 Ohm
Lead Channel Impedance Value: 779 Ohm
Lead Channel Pacing Threshold Amplitude: 0.75 V
Lead Channel Pacing Threshold Amplitude: 0.875 V
Lead Channel Pacing Threshold Amplitude: 1.125 V
Lead Channel Pacing Threshold Pulse Width: 0.4 ms
Lead Channel Pacing Threshold Pulse Width: 0.4 ms
Lead Channel Sensing Intrinsic Amplitude: 13.5 mV
Lead Channel Sensing Intrinsic Amplitude: 13.5 mV
Lead Channel Sensing Intrinsic Amplitude: 2.125 mV
Lead Channel Sensing Intrinsic Amplitude: 2.125 mV
Lead Channel Setting Pacing Amplitude: 1.75 V
Lead Channel Setting Pacing Amplitude: 2 V
Lead Channel Setting Pacing Amplitude: 2.5 V
Lead Channel Setting Pacing Pulse Width: 1 ms
Lead Channel Setting Sensing Sensitivity: 0.3 mV
MDC IDC LEAD IMPLANT DT: 20180907
MDC IDC MSMT LEADCHNL LV PACING THRESHOLD PULSEWIDTH: 1 ms
MDC IDC MSMT LEADCHNL RV IMPEDANCE VALUE: 380 Ohm
MDC IDC MSMT LEADCHNL RV IMPEDANCE VALUE: 399 Ohm
MDC IDC SET LEADCHNL RV PACING PULSEWIDTH: 0.4 ms
MDC IDC STAT BRADY AS VS PERCENT: 4.45 %

## 2018-09-07 ENCOUNTER — Encounter (HOSPITAL_COMMUNITY)
Admission: RE | Admit: 2018-09-07 | Discharge: 2018-09-07 | Disposition: A | Discharge status: Home / Self Care | Payer: Medicare Other | Source: Ambulatory Visit | Attending: Cardiovascular Disease | Admitting: Cardiovascular Disease

## 2018-09-07 ENCOUNTER — Encounter (HOSPITAL_COMMUNITY): Payer: Self-pay

## 2018-09-08 ENCOUNTER — Telehealth: Payer: Self-pay | Admitting: *Deleted

## 2018-09-08 NOTE — Telephone Encounter (Signed)
Received fax from [employer] stating Pradaxa will not be covered in 2020.  Pt is unable to take Eliquis or Xarelto due to back pain.  I spoke with pt who reports since changing to Pradaxa he has not had problems with back pain.  I told him we would contact insurance to see if Pradaxa could be covered due to Eliquis and Xarelto allergy.

## 2018-09-08 NOTE — Telephone Encounter (Signed)
I have completed the form and faxed it back to [facility].

## 2018-09-08 NOTE — Telephone Encounter (Addendum)
I called [employer] and s/w Doren Custard concerning this Pradaxa non-formulary PA. Per Doren Custard he is faxing over a PA form for [place] to complete and fax back to them.

## 2018-09-09 ENCOUNTER — Encounter (HOSPITAL_COMMUNITY)
Admission: RE | Admit: 2018-09-09 | Discharge: 2018-09-09 | Disposition: A | Discharge status: Home / Self Care | Payer: Self-pay | Source: Ambulatory Visit | Attending: Cardiovascular Disease | Admitting: Cardiovascular Disease

## 2018-09-09 ENCOUNTER — Telehealth: Payer: Self-pay | Admitting: Internal Medicine

## 2018-09-09 ENCOUNTER — Telehealth: Payer: Self-pay | Admitting: *Deleted

## 2018-09-09 ENCOUNTER — Encounter (HOSPITAL_COMMUNITY): Payer: Self-pay

## 2018-09-09 NOTE — Telephone Encounter (Signed)
LMOVM (DPR) for patient advising that Dr. [name] recommended some possible device reprogramming. Advised that a scheduler will contact him to set up this appointment. [place] phone number for questions/concerns.

## 2018-09-09 NOTE — Telephone Encounter (Signed)
-----   Message from Deboraha Sprang, MD sent at 09/07/2018  8:51 PM EST ----- Remote reviewed. This remote is abnormal for significant ventricular sensed events-- some of these appear to be failure to atrial sense   Needs OV

## 2018-09-10 ENCOUNTER — Ambulatory Visit (HOSPITAL_COMMUNITY): Payer: Medicare Other

## 2018-09-10 ENCOUNTER — Encounter (HOSPITAL_COMMUNITY): Payer: Medicare Other

## 2018-09-11 ENCOUNTER — Encounter (HOSPITAL_COMMUNITY)
Admission: RE | Admit: 2018-09-11 | Discharge: 2018-09-11 | Disposition: A | Discharge status: Home / Self Care | Payer: Self-pay | Source: Ambulatory Visit | Attending: Cardiovascular Disease | Admitting: Cardiovascular Disease

## 2018-09-11 ENCOUNTER — Encounter (HOSPITAL_COMMUNITY): Payer: Self-pay

## 2018-09-11 ENCOUNTER — Other Ambulatory Visit: Payer: Self-pay | Admitting: Cardiovascular Disease

## 2018-09-11 NOTE — Telephone Encounter (Signed)
**Note De-Identified Justin [name] Obfuscation** Letter received from [employer] stating that they have approved the pts Pradaxa non formulary exception until 08/26/2019.  I have notified the pts pharmacy of this approval.

## 2018-09-11 NOTE — Telephone Encounter (Signed)
We received another form to complete from [employer] concerning the pts Pradaxa. I have completed the form and faxed it back to [facility].

## 2018-09-11 NOTE — Telephone Encounter (Signed)
Patient is scheduled with Dr. [name] on 09/15/18 at 4:00pm.

## 2018-09-14 ENCOUNTER — Encounter (HOSPITAL_COMMUNITY): Payer: Self-pay

## 2018-09-14 ENCOUNTER — Encounter (HOSPITAL_COMMUNITY)
Admission: RE | Admit: 2018-09-14 | Discharge: 2018-09-14 | Disposition: A | Discharge status: Home / Self Care | Payer: Self-pay | Source: Ambulatory Visit | Attending: Cardiovascular Disease | Admitting: Cardiovascular Disease

## 2018-09-14 DIAGNOSIS — H3122 Choroidal dystrophy (central areolar) (generalized) (peripapillary): Secondary | ICD-10-CM | POA: Diagnosis not present

## 2018-09-14 DIAGNOSIS — H35363 Drusen (degenerative) of macula, bilateral: Secondary | ICD-10-CM | POA: Diagnosis not present

## 2018-09-14 DIAGNOSIS — H353213 Exudative age-related macular degeneration, right eye, with inactive scar: Secondary | ICD-10-CM | POA: Diagnosis not present

## 2018-09-14 DIAGNOSIS — H353122 Nonexudative age-related macular degeneration, left eye, intermediate dry stage: Secondary | ICD-10-CM | POA: Diagnosis not present

## 2018-09-14 DIAGNOSIS — H35453 Secondary pigmentary degeneration, bilateral: Secondary | ICD-10-CM | POA: Diagnosis not present

## 2018-09-15 ENCOUNTER — Encounter: Payer: Self-pay | Admitting: Internal Medicine

## 2018-09-15 ENCOUNTER — Ambulatory Visit: Payer: Medicare Other | Admitting: Internal Medicine

## 2018-09-15 VITALS — BP 128/70 | HR 82 | Ht 68.0 in | Wt 268.4 lb

## 2018-09-15 DIAGNOSIS — I255 Ischemic cardiomyopathy: Secondary | ICD-10-CM

## 2018-09-15 DIAGNOSIS — I428 Other cardiomyopathies: Secondary | ICD-10-CM

## 2018-09-15 DIAGNOSIS — Z95810 Presence of automatic (implantable) cardiac defibrillator: Secondary | ICD-10-CM | POA: Diagnosis not present

## 2018-09-15 DIAGNOSIS — I480 Paroxysmal atrial fibrillation: Secondary | ICD-10-CM

## 2018-09-15 NOTE — Progress Notes (Signed)
Patient Care Team: Laurey Morale, MD as PCP - General Angelena Form, [name], MD as PCP - Cardiology (Cardiology) Festus Aloe, MD as Consulting Physician (Urology)   HPI  Justin [name] is a 85 y.o. male Seen for follow-up of a CRT ICD implanted because of syncope  and new left bundle branch block following TAVR    Much improved with CRT     [place] trip [place] the [place]     1/18 Cath >>SVG patent   Prior CABG 2007    DATE TEST EF   8/18 Echo   40-45 %   6/19 Echo   40-45 %         Over the last few months he has had increasing problems with peripheral edema dyspnea and weight gain.  He ascribes it to exuberant salt intake.    No chest pain but significant fatigue and lassitude   Records and Results Reviewed  Past Medical History:  Diagnosis Date  . Age-related macular degeneration, dry, both eyes   . Allergic    "24/7; 365 days/year; I'm allergic to pollens, dust, all southern grasses/trees, mold, mildue, cats, dogs" (01/07/2017)  . Anal fissure   . Asthma    sees [name]   . Benign prostatic hypertrophy    (sees Dr. Denman George  . CAD (coronary artery disease)    a. s/p CABG 2007. b. Cath 08/2016 - 4/4 patent grafts.  . Carotid bruit    carotid u/s 10/10: 0.39% bilaterally  . Chronic combined systolic and diastolic CHF (congestive heart failure) ([place])   . Complication of anesthesia 1980s   "w/anal cyst OR, he gave me a saddle block then put a narcotic in spinal cord; had a severe reaction to that" (01/07/2017)  . Congestive heart failure (CHF) ([place])   . ED (erectile dysfunction)   . Family history of adverse reaction to anesthesia    "daughter wakes up during OR" (01/07/2017)  . GERD (gastroesophageal reflux disease)   . Gout   . HTN (hypertension)   . Hx of colonic polyps    (sees Dr. Henrene Pastor)  . Hyperlipidemia   . Hypothyroidism   . Moderate to severe aortic stenosis    a. s/p TAVR 02/2017.  [name] Myocardial infarction ([place]) ~ 2000  . Obesity    . Osteoarthritis    "was in my knees, hands" (01/07/2017 )  . PAF (paroxysmal atrial fibrillation) ([place])    a. documented post TAVR.  [name] Precancerous skin lesion    (sees Dr. Allyson Sabal)  . Prostate cancer ([place]) dx'd ~ 2014  . 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 ANUS SURGERY     "opened it back up cause it wouldn't heal; wound up w/a fissure" (01/07/2017)  . BIV ICD INSERTION CRT-D N/A 05/02/2017   Procedure: BIV ICD INSERTION CRT-D;  Surgeon: Deboraha Sprang, MD;  Location: [place];  Service: Cardiovascular;  Laterality: N/A;  . CARDIAC CATHETERIZATION  10/29/2005  . CARDIAC CATHETERIZATION N/A 08/27/2016   Procedure: Right/Left Heart Cath and Coronary/Graft Angiography;  Surgeon: Burnell Blanks, MD;  Location: [facility];  Service: Cardiovascular;  Laterality: N/A;  . CATARACT EXTRACTION W/ INTRAOCULAR LENS  IMPLANT, BILATERAL Bilateral   . CATARACT EXTRACTION, BILATERAL  2012  .  COLONOSCOPY  06/30/2008   no repeats needed   . COLONOSCOPY     had 3 or 4 in the past   . CORONARY ARTERY BYPASS GRAFT  2007   "CABG X4"  . CYST EXCISION PERINEAL  1980s  . HAMMER TOE SURGERY Bilateral   . JOINT REPLACEMENT    . KNEE ARTHROPLASTY  07/30/2011   Procedure: COMPUTER ASSISTED TOTAL KNEE ARTHROPLASTY;  Surgeon: Meredith Pel;  Location: [facility];  Service: Orthopedics;  Laterality: Left;  left total knee arthroplasty  . MASTECTOMY SUBCUTANEOUS Bilateral   . ORIF FINGER / THUMB FRACTURE Right ~ 1980   "repair of thumb injury"  . PROSTATE BIOPSY    . REPLACEMENT TOTAL KNEE BILATERAL Bilateral 2012  . TEE WITHOUT CARDIOVERSION N/A 03/04/2017   Procedure: TRANSESOPHAGEAL ECHOCARDIOGRAM (TEE);  Surgeon: Burnell Blanks, MD;  Location: [facility];  Service: Open Heart  Surgery;  Laterality: N/A;  . TRANSCATHETER AORTIC VALVE REPLACEMENT, TRANSFEMORAL N/A 03/04/2017   Procedure: TRANSCATHETER AORTIC VALVE REPLACEMENT, TRANSFEMORAL;  Surgeon: Burnell Blanks, MD;  Location: [facility];  Service: Open Heart Surgery;  Laterality: N/A;    Current Outpatient Medications  Medication Sig Dispense Refill  . albuterol (PROVENTIL HFA;VENTOLIN HFA) 108 (90 Base) MCG/ACT inhaler Inhale 2 puffs into the lungs every 6 (six) hours as needed for wheezing or shortness of breath. 1 Inhaler 6  . aspirin 81 MG tablet Take 1 tablet (81 mg total) by mouth daily. 30 tablet 0  . atorvastatin (LIPITOR) 20 MG tablet TAKE 1 TABLET ONCE DAILY. 90 tablet 0  . benzonatate (TESSALON) 200 MG capsule TAKE 1 CAPSULE EVERY 8 HOURS AS NEEDED FOR COUGH. 45 capsule 1  . budesonide-formoterol (SYMBICORT) 80-4.5 MCG/ACT inhaler Inhale 2 puffs into the lungs 2 (two) times daily. 1 Inhaler 0  . cetirizine (ZYRTEC) 10 MG tablet Take 10 mg by mouth daily.    . chlorpheniramine (CHLOR-TRIMETON) 4 MG tablet Take 8 mg by mouth 2 (two) times daily.     [name] DYMISTA 137-50 MCG/ACT SUSP USE 2 SPRAYS EACH NOSTRIL TWICE DAILY. 23 g 1  . famotidine (PEPCID) 10 MG tablet Take 10 mg by mouth at bedtime.    . fluconazole (DIFLUCAN) 200 MG tablet Take 1 tablet (200 mg total) by mouth as directed. Take two tablets by mouth on day 1 Take one tablet by mouth for the next 20 days Then Stop. 22 tablet 0  . furosemide (LASIX) 80 MG tablet TAKE ONE TABLET ONCE A DAY ALTERNATING WITH ONE TABLET TWICE A DAY. 135 tablet 3  . HYDROcodone-homatropine (HYCODAN) 5-1.5 MG/5ML syrup Take 5 mLs by mouth at bedtime and may repeat dose one time if needed. 240 mL 0  . ipratropium (ATROVENT) 0.03 % nasal spray Place 2 sprays into both nostrils every 6 (six) hours as needed for rhinitis. 30 mL 4  . isosorbide mononitrate (IMDUR) 30 MG 24 hr tablet Take 1 tablet (30 mg total) by mouth daily. 90 tablet 1  . Ketotifen Fumarate (ZADITOR OP)  Apply 1 drop to eye daily as needed (dry eyes).    [name] levothyroxine (SYNTHROID, LEVOTHROID) 75 MCG tablet Take 1 tablet (75 mcg total) by mouth daily. 90 tablet 3  . lidocaine (XYLOCAINE) 2 % solution Use as directed 10 mLs in the mouth or throat every 6 (six) hours as needed (sore throat). 100 mL 2  . losartan (COZAAR) 25 MG tablet TAKE 1 TABLET EACH DAY. 90 tablet 1  . metoprolol tartrate (LOPRESSOR) 25 MG tablet  TAKE 1 TABLET BY MOUTH TWICE DAILY. 180 tablet 1  . montelukast (SINGULAIR) 10 MG tablet Take 1 tablet (10 mg total) by mouth every evening. 90 tablet 4  . Multiple Vitamins-Minerals (PRESERVISION AREDS PO) Take 2 tablets by mouth every morning.    [name] omeprazole (PRILOSEC) 20 MG capsule Take 1 capsule (20 mg total) by mouth 2 (two) times daily. 90 capsule 3  . potassium chloride SA (K-DUR,KLOR-CON) 20 MEQ tablet Take 2 tablets by mouth twice daily alternating with 2 tablets daily. 270 tablet 3  . PRADAXA 150 MG CAPS capsule TAKE (1) CAPSULE TWICE DAILY. 180 capsule 1  . triamcinolone cream (KENALOG) 0.1 % Apply 1 application topically daily as needed for dry skin.    . valACYclovir (VALTREX) 500 MG tablet Take 1 tablet (500 mg total) by mouth 2 (two) times daily. 60 tablet 5   No current facility-administered medications for this visit.     Allergies  Allergen Reactions  . Peanut-Containing Drug Products Anaphylaxis  . Sulfonamide Derivatives Anaphylaxis  . Amlodipine Swelling    Swelling in ankles  . Eliquis [Apixaban] Other (See Comments)    Back/hip pain  . Lisinopril Cough    cough  . Xarelto [Rivaroxaban] Other (See Comments)    Back/hip pain      Review of Systems negative except from HPI and PMH  Physical Exam BP 128/70   Pulse 82   Ht 5\' 8"  (1.727 m)   Wt 268 lb 6.4 oz (121.7 kg)   SpO2 95%   BMI 40.81 kg/m  Well developed and nourished in no acute distress HENT normal Neck supple with JVP-8-10 Clear Regular rate and rhythm without M Abd-soft with  active BS No Clubbing cyanosis 2+ edema Skin-warm and dry A & Oriented  Grossly normal sensory and motor function    ECG P-synchronous/ AV  Pacing with upright QRS V1 and neg lead 1  Freq PVCs  Assessment and  Plan  Ventricular tachycardia- prolonged- nonsustained  Sinus node dysfunction  Junctional rhythm  Post TAVR left bundle branch block/IVCD  Cardiomyopathy EF 40-45%  CRT- D Med[employer]onic  The patient's device was interrogated.  The information was reviewed. No changes were made in the programming.  Patient has PVARP extension associated with PVCs which is decreasing his biventricular pacing percentage.  PVCs-frequent-  Nonsustained VT.  Also significant ventricular ectopy burden.  Over the last 3 months it has been about 2%; this afternoon is 30%.  Electromechanical effects are likely responsible; not sure however whether the ventricular ectopy has kicked and is caused a heart failure or the other way around.  I suspect it is the latter.  We will undertake an aggressive diuretic regime.  He is about 15 pounds overweight.  We will increase his diuretics to twice daily until he reaches his dry weight of about 255.  At that point we will reset his PVC counters and determine their frequency.  We spent more than 50% of our >25 min visit in face to face counseling regarding the above

## 2018-09-15 NOTE — Patient Instructions (Addendum)
Medication Instructions:  Your physician has recommended you make the following change in your medication:   1. Take 80mg  lasix, twice daily, every other day until your weight returns to 252lbs. Please take additional K when you take your extra dose of lasix.   Labwork: None ordered.  Testing/Procedures: None ordered.  Follow-Up: Your physician recommends that you schedule a follow-up appointment in:   Please call us when you are to your goal weight to schedule a device clinic appt to reset PVC counters on your device.   One Year with Dr [name]  Any Other Special Instructions Will Be Listed Below (If Applicable).     If you need a refill on your cardiac medications before your next appointment, please call your pharmacy.

## 2018-09-16 ENCOUNTER — Encounter (HOSPITAL_COMMUNITY)
Admission: RE | Admit: 2018-09-16 | Discharge: 2018-09-16 | Disposition: A | Discharge status: Home / Self Care | Payer: Self-pay | Source: Ambulatory Visit | Attending: Cardiovascular Disease | Admitting: Cardiovascular Disease

## 2018-09-16 ENCOUNTER — Encounter (HOSPITAL_COMMUNITY): Payer: Self-pay

## 2018-09-17 ENCOUNTER — Other Ambulatory Visit: Payer: Self-pay | Admitting: Pulmonary Disease

## 2018-09-17 ENCOUNTER — Encounter (HOSPITAL_COMMUNITY)
Admission: RE | Admit: 2018-09-17 | Discharge: 2018-09-17 | Disposition: A | Discharge status: Home / Self Care | Payer: Medicare Other | Source: Ambulatory Visit | Attending: Orthopedic Surgery | Admitting: Orthopedic Surgery

## 2018-09-17 DIAGNOSIS — G8929 Other chronic pain: Secondary | ICD-10-CM | POA: Insufficient documentation

## 2018-09-17 DIAGNOSIS — M25561 Pain in right knee: Secondary | ICD-10-CM | POA: Diagnosis not present

## 2018-09-17 DIAGNOSIS — Z471 Aftercare following joint replacement surgery: Secondary | ICD-10-CM | POA: Diagnosis not present

## 2018-09-17 DIAGNOSIS — Z96651 Presence of right artificial knee joint: Secondary | ICD-10-CM | POA: Diagnosis not present

## 2018-09-17 LAB — CUP PACEART INCLINIC DEVICE CHECK
Battery Remaining Longevity: 63 mo
Battery Voltage: 2.9 V
Brady Statistic AP VP Percent: 1.07 %
Brady Statistic AP VS Percent: 0.1 %
Brady Statistic AS VP Percent: 93.72 %
Brady Statistic RA Percent Paced: 1.14 %
Brady Statistic RV Percent Paced: 73.72 %
Date Time Interrogation Session: 20200122043304
HighPow Impedance: 71 Ohm
Implantable Lead Implant Date: 20180907
Implantable Lead Implant Date: 20180907
Implantable Lead Implant Date: 20180907
Implantable Lead Location: 753858
Implantable Lead Location: 753859
Implantable Lead Location: 753860
Implantable Lead Model: 4396
Implantable Lead Model: 5076
Implantable Pulse Generator Implant Date: 20180907
Lead Channel Impedance Value: 342 Ohm
Lead Channel Impedance Value: 380 Ohm
Lead Channel Impedance Value: 570 Ohm
Lead Channel Impedance Value: 855 Ohm
Lead Channel Pacing Threshold Amplitude: 0.75 V
Lead Channel Pacing Threshold Amplitude: 0.75 V
Lead Channel Pacing Threshold Amplitude: 1 V
Lead Channel Pacing Threshold Pulse Width: 0.4 ms
Lead Channel Pacing Threshold Pulse Width: 0.4 ms
Lead Channel Pacing Threshold Pulse Width: 1 ms
Lead Channel Sensing Intrinsic Amplitude: 10.75 mV
Lead Channel Sensing Intrinsic Amplitude: 11.875 mV
Lead Channel Sensing Intrinsic Amplitude: 2.5 mV
Lead Channel Sensing Intrinsic Amplitude: 2.625 mV
Lead Channel Setting Pacing Amplitude: 1.5 V
Lead Channel Setting Pacing Amplitude: 2 V
Lead Channel Setting Pacing Amplitude: 2.5 V
Lead Channel Setting Pacing Pulse Width: 0.4 ms
Lead Channel Setting Pacing Pulse Width: 1 ms
MDC IDC MSMT LEADCHNL LV IMPEDANCE VALUE: 456 Ohm
MDC IDC MSMT LEADCHNL RV IMPEDANCE VALUE: 456 Ohm
MDC IDC SET LEADCHNL RV SENSING SENSITIVITY: 0.3 mV
MDC IDC STAT BRADY AS VS PERCENT: 5.11 %

## 2018-09-17 MED ORDER — TECHNETIUM TC 99M MEDRONATE IV KIT
20.5000 | PACK | Freq: Once | INTRAVENOUS | Status: AC | PRN
  Administered 2018-09-17: 20.5 via INTRAVENOUS

## 2018-09-18 ENCOUNTER — Encounter (HOSPITAL_COMMUNITY)
Admission: RE | Admit: 2018-09-18 | Discharge: 2018-09-18 | Disposition: A | Discharge status: Home / Self Care | Payer: Self-pay | Source: Ambulatory Visit | Attending: Cardiovascular Disease | Admitting: Cardiovascular Disease

## 2018-09-18 ENCOUNTER — Encounter (HOSPITAL_COMMUNITY): Payer: Self-pay

## 2018-09-21 ENCOUNTER — Encounter (HOSPITAL_COMMUNITY)
Admission: RE | Admit: 2018-09-21 | Discharge: 2018-09-21 | Disposition: A | Discharge status: Home / Self Care | Payer: Self-pay | Source: Ambulatory Visit | Attending: Cardiovascular Disease | Admitting: Cardiovascular Disease

## 2018-09-21 ENCOUNTER — Encounter (HOSPITAL_COMMUNITY): Payer: Self-pay

## 2018-09-23 ENCOUNTER — Encounter (HOSPITAL_COMMUNITY): Payer: Self-pay

## 2018-09-23 ENCOUNTER — Other Ambulatory Visit: Payer: Self-pay | Admitting: Pulmonary Disease

## 2018-09-23 ENCOUNTER — Encounter (HOSPITAL_COMMUNITY)
Admission: RE | Admit: 2018-09-23 | Discharge: 2018-09-23 | Disposition: A | Discharge status: Home / Self Care | Payer: Self-pay | Source: Ambulatory Visit | Attending: Cardiovascular Disease | Admitting: Cardiovascular Disease

## 2018-09-24 ENCOUNTER — Encounter: Payer: Self-pay | Admitting: Allergy

## 2018-09-24 ENCOUNTER — Ambulatory Visit: Payer: Medicare Other | Admitting: Allergy

## 2018-09-24 ENCOUNTER — Telehealth (INDEPENDENT_AMBULATORY_CARE_PROVIDER_SITE_OTHER): Payer: Self-pay | Admitting: Orthopedic Surgery

## 2018-09-24 ENCOUNTER — Telehealth: Payer: Self-pay | Admitting: *Deleted

## 2018-09-24 VITALS — BP 110/64 | HR 76 | Temp 97.7°F | Resp 18 | Ht 67.0 in | Wt 264.8 lb

## 2018-09-24 DIAGNOSIS — J4521 Mild intermittent asthma with (acute) exacerbation: Secondary | ICD-10-CM | POA: Diagnosis not present

## 2018-09-24 DIAGNOSIS — J3089 Other allergic rhinitis: Secondary | ICD-10-CM | POA: Diagnosis not present

## 2018-09-24 DIAGNOSIS — T781XXD Other adverse food reactions, not elsewhere classified, subsequent encounter: Secondary | ICD-10-CM | POA: Diagnosis not present

## 2018-09-24 NOTE — Progress Notes (Signed)
New Patient Note  RE: Justin [name] MRN: [id number] DOB: 06-Aug-1934 Date of Office Visit: 09/24/2018  Referring provider: Martyn Ehrich, NP Primary care provider: Laurey Morale, MD  Chief Complaint: allergies  History of present illness: Justin [name] is a 85 y.o. male presenting today for consultation for allergic rhinitis.   He states he has had allergies since childhood.  Is been in the [place] area since 1966.  He reports he used to see Dr. Yvetta Coder for his allergy care back then.  He reports being allergic to Southern grasses and trees predominantly.   He has been on immunotherapy 3 different courses in lifetime.  He states he is interested in restarting immunotherapy as he would like to limit his medication use and potentially come off his allergy medications as he takes a lot of medicine for other issues.  He reports nasal drainage and congestion.  He states he uses about 1/2 box of kleenex in the mornings.  He does report sneezing fits. He takes zyrtec, singulair and dymista.  He takes zyrtec in the AM and states it takes about 30 min-1 hr to kick-in and is effective.  He takes Singulair at night.  He uses Dymista 1 spray twice a day and this works well for him thus he pays for this medication since it is not covered.  He also states he uses nasal saline spray at night.  He has a history of asthma. He has had esophageal yeast infections seen on endoscopy that was caused to steroid inhaler use.  He states he was rinsing mouth after use and brushing teeth and still was developing yeast infections.  Decision was made with his PCP and other providers to stop the symbicort.  He does have albuterol inhaler.  He states he rarely needs to use the albuterol inhaler.  He reports a longstanding history of a peanut allergy.  He states there are other foods that he has tested positive for in the past but he only really avoids peanuts.  He states that he will eat shrimp and crab  sparingly.  He eats fish pretty regularly.  He does report that walnuts will make his mouth feel itchy.  Tries to stay away from watermelon and cantaloupe as they cause increased nasal congestion and upset stomach.  He states he is able to eat pecans, pistachios, cashews without any issue.  He states he has never had an epinephrine device.  As of 3 years ago he started seeing [name] for his heart disease and has had valve placement and a pacemaker placed.  He is on a beta-blocker.    Review of systems: Review of Systems  Constitutional: Negative for chills, fever and malaise/fatigue.  HENT: Positive for congestion and nosebleeds. Negative for ear discharge, ear pain and sore throat.   Eyes: Negative for pain, discharge and redness.  Respiratory: Negative for cough, shortness of breath and wheezing.   Cardiovascular: Negative for chest pain.  Gastrointestinal: Negative for abdominal pain, constipation, diarrhea, heartburn, nausea and vomiting.  Musculoskeletal: Positive for joint pain.  Skin: Negative for itching and rash.  Neurological: Negative for headaches.    All other systems negative unless noted above in HPI  Past medical history: Past Medical History:  Diagnosis Date  . Age-related macular degeneration, dry, both eyes   . Allergic    "24/7; 365 days/year; I'm allergic to pollens, dust, all southern grasses/trees, mold, mildue, cats, dogs" (01/07/2017)  . Anal fissure   .  Asthma    sees [name]   . Benign prostatic hypertrophy    (sees Dr. Denman George  . CAD (coronary artery disease)    a. s/p CABG 2007. b. Cath 08/2016 - 4/4 patent grafts.  . Carotid bruit    carotid u/s 10/10: 0.39% bilaterally  . Chronic combined systolic and diastolic CHF (congestive heart failure) ([place])   . Complication of anesthesia 1980s   "w/anal cyst OR, he gave me a saddle block then put a narcotic in spinal cord; had a severe reaction to that" (01/07/2017)  . Congestive heart failure (CHF)  ([place])   . ED (erectile dysfunction)   . Family history of adverse reaction to anesthesia    "daughter wakes up during OR" (01/07/2017)  . GERD (gastroesophageal reflux disease)   . Gout   . HTN (hypertension)   . Hx of colonic polyps    (sees Dr. Henrene Pastor)  . Hyperlipidemia   . Hypothyroidism   . Moderate to severe aortic stenosis    a. s/p TAVR 02/2017.  [name] Myocardial infarction ([place]) ~ 2000  . Obesity   . Osteoarthritis    "was in my knees, hands" (01/07/2017 )  . PAF (paroxysmal atrial fibrillation) ([place])    a. documented post TAVR.  [name] Precancerous skin lesion    (sees Dr. Allyson Sabal)  . Prostate cancer ([place]) dx'd ~ 2014  . 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 ANUS SURGERY     "opened it back up cause it wouldn't heal; wound up w/a fissure" (01/07/2017)  . BIV ICD INSERTION CRT-D N/A 05/02/2017   Procedure: BIV ICD INSERTION CRT-D;  Surgeon: Deboraha Sprang, MD;  Location: [facility];  Service: Cardiovascular;  Laterality: N/A;  . CARDIAC CATHETERIZATION  10/29/2005  . CARDIAC CATHETERIZATION N/A 08/27/2016   Procedure: Right/Left Heart Cath and Coronary/Graft Angiography;  Surgeon: Burnell Blanks, MD;  Location: [facility];  Service: Cardiovascular;  Laterality: N/A;  . CATARACT EXTRACTION W/ INTRAOCULAR LENS  IMPLANT, BILATERAL Bilateral   . CATARACT EXTRACTION, BILATERAL  2012  . COLONOSCOPY  06/30/2008   no repeats needed   . COLONOSCOPY     had 3 or 4 in the past   . CORONARY ARTERY BYPASS GRAFT  2007   "CABG X4"  . CYST EXCISION PERINEAL  1980s  . HAMMER TOE SURGERY Bilateral   . JOINT REPLACEMENT    . KNEE ARTHROPLASTY  07/30/2011   Procedure: COMPUTER ASSISTED TOTAL KNEE ARTHROPLASTY;  Surgeon: Meredith Pel;  Location: [facility];  Service: Orthopedics;  Laterality: Left;  left total knee arthroplasty  . MASTECTOMY SUBCUTANEOUS Bilateral   . ORIF FINGER / THUMB FRACTURE Right ~ 1980   "repair of thumb injury"  . PROSTATE BIOPSY    . REPLACEMENT TOTAL KNEE BILATERAL Bilateral 2012  . TEE WITHOUT CARDIOVERSION N/A 03/04/2017   Procedure: TRANSESOPHAGEAL ECHOCARDIOGRAM (TEE);  Surgeon: Burnell Blanks, MD;  Location: [facility];  Service: Open Heart Surgery;  Laterality: N/A;  . TRANSCATHETER AORTIC VALVE REPLACEMENT, TRANSFEMORAL N/A 03/04/2017   Procedure: TRANSCATHETER AORTIC VALVE REPLACEMENT, TRANSFEMORAL;  Surgeon: Burnell Blanks, MD;  Location: [facility];  Service: Open Heart Surgery;  Laterality: N/A;    Family history:  Family History  Problem Relation Age of  Onset  . Heart attack Father 86  . Allergic rhinitis Father   . Asthma Father   . Heart failure Mother 53  . Uterine cancer Mother   . Breast cancer Mother   . Colon cancer Neg Hx   . Esophageal cancer Neg Hx     Social history: He lives in an apartment with carpeting with electric heating and central cooling.  There are no pets in the home.  There is no concern for water damage, mildew or roaches in the home.  He still works and owns his own company. Tobacco Use  . Smoking status: Former Smoker    Packs/day: 3.50    Years: 13.00    Pack years: 45.50    Types: Cigarettes    Last attempt to quit: 1963    Years since quitting: 57.1  . Smokeless tobacco: Never Used    Medication List: Allergies as of 09/24/2018      Reactions   Peanut-containing Drug Products Anaphylaxis   Sulfonamide Derivatives Anaphylaxis   Amlodipine Swelling   Swelling in ankles   Eliquis [apixaban] Other (See Comments)   Back/hip pain   Lisinopril Cough   cough   Xarelto [rivaroxaban] Other (See Comments)   Back/hip pain      Medication List       Accurate as of September 24, 2018  1:35 PM. Always use your most recent med list.        albuterol  108 (90 Base) MCG/ACT inhaler Commonly known as:  PROVENTIL HFA;VENTOLIN HFA Inhale 2 puffs into the lungs every 6 (six) hours as needed for wheezing or shortness of breath.   aspirin 81 MG tablet Take 1 tablet (81 mg total) by mouth daily.   atorvastatin 20 MG tablet Commonly known as:  LIPITOR TAKE 1 TABLET ONCE DAILY.   benzonatate 200 MG capsule Commonly known as:  TESSALON TAKE 1 CAPSULE EVERY 8 HOURS AS NEEDED FOR COUGH.   budesonide-formoterol 80-4.5 MCG/ACT inhaler Commonly known as:  SYMBICORT Inhale 2 puffs into the lungs 2 (two) times daily.   cetirizine 10 MG tablet Commonly known as:  ZYRTEC Take 10 mg by mouth daily.   chlorpheniramine 4 MG tablet Commonly known as:  CHLOR-TRIMETON Take 8 mg by mouth 2 (two) times daily.   DYMISTA 137-50 MCG/ACT Susp Generic drug:  Azelastine-Fluticasone USE 2 SPRAYS EACH NOSTRIL TWICE DAILY.   famotidine 10 MG tablet Commonly known as:  PEPCID Take 10 mg by mouth at bedtime.   fluconazole 200 MG tablet Commonly known as:  DIFLUCAN Take 1 tablet (200 mg total) by mouth as directed. Take two tablets by mouth on day 1 Take one tablet by mouth for the next 20 days Then Stop.   furosemide 80 MG tablet Commonly known as:  LASIX TAKE ONE TABLET ONCE A DAY ALTERNATING WITH ONE TABLET TWICE A DAY.   HYDROcodone-homatropine 5-1.5 MG/5ML syrup Commonly known as:  HYCODAN Take 5 mLs by mouth at bedtime and may repeat dose one time if needed.   ipratropium 0.03 % nasal spray Commonly known as:  ATROVENT Place 2 sprays into both nostrils every 6 (six) hours as needed for rhinitis.   isosorbide mononitrate 30 MG 24 hr tablet Commonly known as:  IMDUR Take 1 tablet (30 mg total) by mouth daily.   levothyroxine 75 MCG tablet Commonly known as:  SYNTHROID, LEVOTHROID Take 1 tablet (75 mcg total) by mouth daily.   lidocaine 2 % solution Commonly known as:  XYLOCAINE Use as  directed 10 mLs in the mouth or throat every 6 (six)  hours as needed (sore throat).   losartan 25 MG tablet Commonly known as:  COZAAR TAKE 1 TABLET EACH DAY.   metoprolol tartrate 25 MG tablet Commonly known as:  LOPRESSOR TAKE 1 TABLET BY MOUTH TWICE DAILY.   montelukast 10 MG tablet Commonly known as:  SINGULAIR Take 1 tablet (10 mg total) by mouth every evening.   omeprazole 20 MG capsule Commonly known as:  PRILOSEC Take 1 capsule (20 mg total) by mouth 2 (two) times daily.   potassium chloride SA 20 MEQ tablet Commonly known as:  K-DUR,KLOR-CON Take 2 tablets by mouth twice daily alternating with 2 tablets daily.   PRADAXA 150 MG Caps capsule Generic drug:  dabigatran TAKE (1) CAPSULE TWICE DAILY.   PRESERVISION AREDS PO Take 2 tablets by mouth every morning.   triamcinolone cream 0.1 % Commonly known as:  KENALOG Apply 1 application topically daily as needed for dry skin.   valACYclovir 500 MG tablet Commonly known as:  VALTREX Take 1 tablet (500 mg total) by mouth 2 (two) times daily.   ZADITOR OP Apply 1 drop to eye daily as needed (dry eyes).       Known medication allergies: Allergies  Allergen Reactions  . Peanut-Containing Drug Products Anaphylaxis  . Sulfonamide Derivatives Anaphylaxis  . Amlodipine Swelling    Swelling in ankles  . Eliquis [Apixaban] Other (See Comments)    Back/hip pain  . Lisinopril Cough    cough  . Xarelto [Rivaroxaban] Other (See Comments)    Back/hip pain     Physical examination: Blood pressure 110/64, pulse 76, temperature 97.7 F (36.5 C), temperature source Oral, resp. rate 18, height 5\' 7"  (1.702 m), weight 264 lb 12.8 oz (120.1 kg), SpO2 97 %.  General: Alert, interactive, in no acute distress. HEENT: PERRLA, TMs pearly gray, turbinates mildly edematous with clear discharge, post-pharynx non erythematous. Neck: Supple without lymphadenopathy. Lungs: Clear to auscultation without wheezing, rhonchi or rales. {no increased work of breathing. CV: Normal S1, S2  without murmurs. Abdomen: Nondistended, nontender. Skin: Warm and dry, without lesions or rashes. Extremities:  No clubbing, cyanosis or edema. Neuro:   Grossly intact.  Diagnositics/Labs:  Spirometry: FEV1: 1.76L 73%, FVC: 2.26L 66% predicted.  FVC is mildly reduced for age  Allergy testing: Unable to perform due to Zyrtec use this morning  Assessment and plan:   Allergic rhinitis  - will obtain environmental allergy panel today to determine what you are still reactive to.   If panel is negative then will recommend skin testing visit (off zyrtec for 3 days prior to visit) to confirm  - if positive then allergen immunotherapy (allergy shots) is recommended to help lose your sensitivity and thus need for medication management  - at this time continue your current routine with zyrtec, singulair, dymista and saline nasal spray  - allergen immunotherapy discussed today including protocol, benefits and risk.  Informational handout provided. Will provide you with an epinephrine device (AuviQ or Epipen) if starting immunotherapy.   - Will need to discuss decreasing or change metoprolol if starting immunotherapy.    Mild intermittent asthma  - have access to albuterol inhaler 2 puffs every 4-6 hours as needed for cough/wheeze/shortness of breath/chest tightness.  May use 15-20 minutes prior to activity.   Monitor frequency of use.    -He has had recurrent episodes of esophageal thrush related to ICS use and it has been decided previously that he will  hold ICS use.  Adverse food reaction  -He will continue to avoid peanuts, watermelon cantaloupe  -As above he will have access to an epinephrine device in case of allergic reaction  Follow-up 4-6 months or sooner if needed  I appreciate the opportunity to take part in Chaney's care. Please do not hesitate to contact me with questions.  Sincerely,   Prudy Feeler, MD Allergy/Immunology [facility]

## 2018-09-24 NOTE — Telephone Encounter (Signed)
Please advise 

## 2018-09-24 NOTE — Telephone Encounter (Signed)
Patient stated that if Dr. Marlou Sa needs to do surgery, the first week of March is the best time for him.  CB#[phone].  Thank you.

## 2018-09-24 NOTE — Patient Instructions (Addendum)
Allergic rhinitis  - will obtain environmental allergy panel today to determine what you are still reactive to.   If panel is negative then will recommend skin testing visit (off zyrtec for 3 days prior to visit) to confirm  - if positive then allergen immunotherapy (allergy shots) is recommended to help lose your sensitivity and thus need for medication management  - at this time continue your current routine with zyrtec, singulair, dymista and saline nasal spray  - allergen immunotherapy discussed today including protocol, benefits and risk.  Informational handout provided. Will provide you with an epinephrine device (AuviQ or Epipen) if starting immunotherapy.     Follow-up 4-6 months or sooner if needed

## 2018-09-24 NOTE — Telephone Encounter (Signed)
Copied from [place] [id number]. Topic: General - Other >> Sep 24, 2018  3:32 PM Percell Belt A wrote: Reason for CRM:   Pt called in and stated that ins is not paying for his : Take 1 capsule (20 mg total) by mouth 2 (two) times daily..  because he stated they have take 1 tab daily.  He would like to know if a new script could be called in for the 2 tabs daily   [facility] [place], [place] (Phone)

## 2018-09-24 NOTE — Telephone Encounter (Signed)
Pt called wanting to know if he needs to get surgery scheduled after looking his results through [facility].

## 2018-09-25 ENCOUNTER — Encounter (HOSPITAL_COMMUNITY)
Admission: RE | Admit: 2018-09-25 | Discharge: 2018-09-25 | Disposition: A | Discharge status: Home / Self Care | Payer: Self-pay | Source: Ambulatory Visit | Attending: Cardiovascular Disease | Admitting: Cardiovascular Disease

## 2018-09-25 ENCOUNTER — Encounter (HOSPITAL_COMMUNITY): Payer: Self-pay

## 2018-09-25 MED ORDER — OMEPRAZOLE 20 MG PO CPDR: 20.0000 mg | DELAYED_RELEASE_CAPSULE | Freq: Two times a day (BID) | ORAL | 1 refills | Status: DC

## 2018-09-25 NOTE — Telephone Encounter (Signed)
Refill has been sent in for 90 day supply of the omeprazole BID.

## 2018-09-25 NOTE — Telephone Encounter (Signed)
Sent to wrong office °

## 2018-09-25 NOTE — Telephone Encounter (Signed)
LM for patient, need to know what medication he is talking about

## 2018-09-25 NOTE — Telephone Encounter (Signed)
omeprazole (PRILOSEC) 20 MG capsule [[id number]]   Pt called to give update.

## 2018-09-26 LAB — IGE+ALLERGENS ZONE 2(30)
Alternaria Alternata IgE: <0.1 kU/L
Amer Sycamore IgE Qn: 0.14 kU/L — AB
Aspergillus Fumigatus IgE: 1.24 kU/L — AB
Bermuda Grass IgE: <0.1 kU/L
Cat Dander IgE: <0.1 kU/L
Cedar, Mountain IgE: <0.1 kU/L
Cladosporium Herbarum IgE: <0.1 kU/L
Cockroach, American IgE: <0.1 kU/L
Common Silver Birch IgE: <0.1 kU/L
D Farinae IgE: <0.1 kU/L
D Pteronyssinus IgE: <0.1 kU/L
Dog Dander IgE: <0.1 kU/L
Hickory, White IgE: <0.1 kU/L
IgE (Immunoglobulin E), Serum: 118 IU/mL (ref 6–495)
Johnson Grass IgE: <0.1 kU/L
Maple/Box Elder IgE: <0.1 kU/L
Mucor Racemosus IgE: <0.1 kU/L
Mugwort IgE Qn: <0.1 kU/L
Nettle IgE: <0.1 kU/L
Oak, White IgE: <0.1 kU/L
Penicillium Chrysogen IgE: <0.1 kU/L
Plantain, English IgE: <0.1 kU/L
Ragweed, Short IgE: <0.1 kU/L
Sheep Sorrel IgE Qn: <0.1 kU/L
Stemphylium Herbarum IgE: <0.1 kU/L
Sweet gum IgE RAST Ql: <0.1 kU/L
Timothy Grass IgE: <0.1 kU/L
White Mulberry IgE: <0.1 kU/L

## 2018-09-28 ENCOUNTER — Encounter (HOSPITAL_COMMUNITY)
Admission: RE | Admit: 2018-09-28 | Discharge: 2018-09-28 | Disposition: A | Discharge status: Home / Self Care | Payer: Self-pay | Source: Ambulatory Visit | Attending: Cardiovascular Disease | Admitting: Cardiovascular Disease

## 2018-09-28 ENCOUNTER — Encounter (HOSPITAL_COMMUNITY): Payer: Self-pay

## 2018-09-28 DIAGNOSIS — Z952 Presence of prosthetic heart valve: Secondary | ICD-10-CM | POA: Insufficient documentation

## 2018-09-28 DIAGNOSIS — I350 Nonrheumatic aortic (valve) stenosis: Secondary | ICD-10-CM | POA: Insufficient documentation

## 2018-09-29 ENCOUNTER — Telehealth (INDEPENDENT_AMBULATORY_CARE_PROVIDER_SITE_OTHER): Payer: Self-pay | Admitting: Orthopedic Surgery

## 2018-09-29 NOTE — Telephone Encounter (Signed)
Pt called asking for Dr. Forbes Cellar nurse to contact him about how he needs to proceed

## 2018-09-30 ENCOUNTER — Encounter (HOSPITAL_COMMUNITY): Payer: Self-pay

## 2018-09-30 ENCOUNTER — Encounter: Payer: Self-pay | Admitting: Pulmonary Disease

## 2018-09-30 ENCOUNTER — Ambulatory Visit: Payer: Medicare Other | Admitting: Pulmonary Disease

## 2018-09-30 ENCOUNTER — Encounter (HOSPITAL_COMMUNITY)
Admission: RE | Admit: 2018-09-30 | Discharge: 2018-09-30 | Disposition: A | Discharge status: Home / Self Care | Payer: Self-pay | Source: Ambulatory Visit | Attending: Cardiovascular Disease | Admitting: Cardiovascular Disease

## 2018-09-30 VITALS — BP 116/64 | HR 80 | Ht 67.0 in | Wt 264.0 lb

## 2018-09-30 DIAGNOSIS — R05 Cough: Secondary | ICD-10-CM | POA: Diagnosis not present

## 2018-09-30 DIAGNOSIS — J4520 Mild intermittent asthma, uncomplicated: Secondary | ICD-10-CM

## 2018-09-30 DIAGNOSIS — J309 Allergic rhinitis, unspecified: Secondary | ICD-10-CM

## 2018-09-30 DIAGNOSIS — R059 Cough, unspecified: Secondary | ICD-10-CM

## 2018-09-30 DIAGNOSIS — K219 Gastro-esophageal reflux disease without esophagitis: Secondary | ICD-10-CM | POA: Diagnosis not present

## 2018-09-30 MED ORDER — AZELASTINE-FLUTICASONE 137-50 MCG/ACT NA SUSP: NASAL | 11 refills | Status: DC

## 2018-09-30 NOTE — Progress Notes (Signed)
Subjective:    Patient ID: Justin [name], male    DOB: 11/17/1933, 85 y.o.   MRN: [id number]  Synopsis: Former patient of Dr. Gwenette Greet who has irritable larynx syndrome as well as mild persistent asthma.' May 2017 CT scan of the sinuses was a poor study, there is no clear acute or chronic sinusitis noted. Previously treated with immunotherapy by Dr. Velora Heckler many years prior.   HPI  Chief Complaint  Patient presents with  . Follow-up    6 wk f/u for cough and allergies. States he is currently doing ok. Allergist suggested allergy shots but he is not sure about this.    When was seen here in our clinic a few times over the last several months because of increasing cough.  He says that his symptoms are fairly well-controlled on his current regimen though he understands that with pollen season right around the corner there is a high likelihood that it is going to get worse.  He has not had much shortness of breath recently.  He stopped taking his Symbicort because he says that it was causing too much thrush.  He has not had any ill effect off of it.  He saw an allergist and it has been recommended that he have allergy shots  Past Medical History:  Diagnosis Date  . Age-related macular degeneration, dry, both eyes   . Allergic    "24/7; 365 days/year; I'm allergic to pollens, dust, all southern grasses/trees, mold, mildue, cats, dogs" (01/07/2017)  . Anal fissure   . Asthma    sees [name]   . Benign prostatic hypertrophy    (sees Dr. Denman George  . CAD (coronary artery disease)    a. s/p CABG 2007. b. Cath 08/2016 - 4/4 patent grafts.  . Carotid bruit    carotid u/s 10/10: 0.39% bilaterally  . Chronic combined systolic and diastolic CHF (congestive heart failure) ([employer] [facility])   . Complication of anesthesia 1980s   "w/anal cyst OR, he gave me a saddle block then put a narcotic in spinal cord; had a severe reaction to that" (01/07/2017)  . Congestive heart failure (CHF) ([place])   . ED  (erectile dysfunction)   . Family history of adverse reaction to anesthesia    "daughter wakes up during OR" (01/07/2017)  . GERD (gastroesophageal reflux disease)   . Gout   . HTN (hypertension)   . Hx of colonic polyps    (sees Dr. Henrene Pastor)  . Hyperlipidemia   . Hypothyroidism   . Moderate to severe aortic stenosis    a. s/p TAVR 02/2017.  [name] Myocardial infarction ([place]) ~ 2000  . Obesity   . Osteoarthritis    "was in my knees, hands" (01/07/2017 )  . PAF (paroxysmal atrial fibrillation) ([place])    a. documented post TAVR.  [name] Precancerous skin lesion    (sees Dr. Allyson Sabal)  . Prostate cancer ([place]) dx'd ~ 2014  . 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  HENT: Negative for postnasal drip, rhinorrhea and sinus pressure.   Respiratory: Positive for cough. Negative for shortness of breath and wheezing.   Cardiovascular: Negative for chest pain, palpitations and leg swelling.       Objective:   Physical Exam  Vitals:   09/30/18 1014  BP:  116/64  Pulse: 80  SpO2: 95%  Weight: 264 lb (119.7 kg)  Height: 5\' 7"  (1.702 m)    RA  Gen: well appearing HENT: OP clear, TM's clear, neck supple PULM: CTA B, normal percussion CV: RRR, no mgr, trace edema GI: BS+, soft, nontender Derm: no cyanosis or rash Psyche: normal mood and affect       CBC    Component Value Date/Time   WBC 10.7 (H) 12/30/2017 1633   RBC 4.79 12/30/2017 1633   HGB 13.0 12/30/2017 1633   HGB 13.9 04/23/2017 1012   HCT 39.7 12/30/2017 1633   HCT 42.1 04/23/2017 1012   PLT 229.0 12/30/2017 1633   PLT 241 04/23/2017 1012   MCV 82.9 12/30/2017 1633   MCV 84 04/23/2017 1012   MCH 27.9 04/23/2017 1012   MCH 27.0 03/07/2017 0231   MCHC 32.7 12/30/2017 1633   RDW 16.4 (H)  12/30/2017 1633   RDW 15.8 (H) 04/23/2017 1012   LYMPHSABS 2.2 12/30/2017 1633   LYMPHSABS 2.0 04/23/2017 1012   MONOABS 1.1 (H) 12/30/2017 1633   EOSABS 0.4 12/30/2017 1633   EOSABS 0.3 04/23/2017 1012   BASOSABS 0.2 (H) 12/30/2017 1633   BASOSABS 0.1 04/23/2017 1012   Imaging: 12/2015 CT sinuses without significant disease  Pulmonary function testing: December 2019 spirometry showed a normal ratio, FEV1 2.0 L 78% predicted  Exhaled nitric oxide: December 2000 1943 ppm  Labs  12/2016 IgE > 180 May 2019 eosinophil 400/dL  Records from his visit with asthma and allergy reviewed: They plan skin testing and discussed allergy shots.  It was recommended that he stopped taking an inhaled corticosteroid because of thrush     Assessment & Plan:  Allergic rhinitis, unspecified seasonality, unspecified trigger  Cough  Intermittent asthma, unspecified asthma severity, unspecified whether complicated  Gastroesophageal reflux disease, esophagitis presence not specified  Discussion: In the time that I have cared for Mr. Kiss my observation has been that his symptoms are purely due to upper airway problems from gastroesophageal reflux disease and severe allergic rhinitis.  Even though his exhaled nitric oxide level has been elevated on recent measurements it is never been clear to me that his asthma has been a significant cause of his symptoms.  I completely agree with him holding any inhaled corticosteroid right now for the same reason.  Apparently it has been suggested that he undergo allergy injections, I completely agree with this as I think that his allergic rhinitis is the predominant cause of his symptoms.  Plan: Allergic rhinitis: I think it is a good idea for you to go back to see the allergist and consider allergy injections Continue Dymista Continue Zyrtec Continue Singulair  Gastroesophageal reflux disease: Continue pantoprazole and famotidine as you are doing  Mild  intermittent asthma: I agree with holding Symbicort Use albuterol as needed, Xopenex as needed is also okay  We will see you back in 3 to 4 months or sooner if needed   Current Outpatient Medications:  .  albuterol (PROVENTIL HFA;VENTOLIN HFA) 108 (90 Base) MCG/ACT inhaler, Inhale 2 puffs into the lungs every 6 (six) hours as needed for wheezing or shortness of breath., Disp: 1 Inhaler, Rfl: 6 .  aspirin 81 MG tablet, Take 1 tablet (81 mg total) by mouth daily., Disp: 30 tablet, Rfl: 0 .  atorvastatin (LIPITOR) 20 MG tablet, TAKE 1 TABLET ONCE DAILY., Disp: 90 tablet, Rfl: 0 .  Azelastine-Fluticasone (DYMISTA) 137-50 MCG/ACT SUSP, USE 2 SPRAYS EACH NOSTRIL TWICE DAILY., Disp:  23 g, Rfl: 11 .  benzonatate (TESSALON) 200 MG capsule, TAKE 1 CAPSULE EVERY 8 HOURS AS NEEDED FOR COUGH., Disp: 45 capsule, Rfl: 1 .  cetirizine (ZYRTEC) 10 MG tablet, Take 10 mg by mouth daily., Disp: , Rfl:  .  chlorpheniramine (CHLOR-TRIMETON) 4 MG tablet, Take 8 mg by mouth 2 (two) times daily. , Disp: , Rfl:  .  famotidine (PEPCID) 10 MG tablet, Take 10 mg by mouth at bedtime., Disp: , Rfl:  .  furosemide (LASIX) 80 MG tablet, TAKE ONE TABLET ONCE A DAY ALTERNATING WITH ONE TABLET TWICE A DAY., Disp: 135 tablet, Rfl: 3 .  HYDROcodone-homatropine (HYCODAN) 5-1.5 MG/5ML syrup, Take 5 mLs by mouth at bedtime and may repeat dose one time if needed., Disp: 240 mL, Rfl: 0 .  ipratropium (ATROVENT) 0.03 % nasal spray, Place 2 sprays into both nostrils every 6 (six) hours as needed for rhinitis., Disp: 30 mL, Rfl: 4 .  isosorbide mononitrate (IMDUR) 30 MG 24 hr tablet, Take 1 tablet (30 mg total) by mouth daily., Disp: 90 tablet, Rfl: 1 .  Ketotifen Fumarate (ZADITOR OP), Apply 1 drop to eye daily as needed (dry eyes)., Disp: , Rfl:  .  levothyroxine (SYNTHROID, LEVOTHROID) 75 MCG tablet, Take 1 tablet (75 mcg total) by mouth daily., Disp: 90 tablet, Rfl: 3 .  lidocaine (XYLOCAINE) 2 % solution, Use as directed 10 mLs in the  mouth or throat every 6 (six) hours as needed (sore throat)., Disp: 100 mL, Rfl: 2 .  losartan (COZAAR) 25 MG tablet, TAKE 1 TABLET EACH DAY., Disp: 90 tablet, Rfl: 1 .  metoprolol tartrate (LOPRESSOR) 25 MG tablet, TAKE 1 TABLET BY MOUTH TWICE DAILY., Disp: 180 tablet, Rfl: 1 .  montelukast (SINGULAIR) 10 MG tablet, Take 1 tablet (10 mg total) by mouth every evening., Disp: 90 tablet, Rfl: 4 .  Multiple Vitamins-Minerals (PRESERVISION AREDS PO), Take 2 tablets by mouth every morning., Disp: , Rfl:  .  omeprazole (PRILOSEC) 20 MG capsule, Take 1 capsule (20 mg total) by mouth 2 (two) times daily., Disp: 180 capsule, Rfl: 1 .  potassium chloride SA (K-DUR,KLOR-CON) 20 MEQ tablet, Take 2 tablets by mouth twice daily alternating with 2 tablets daily., Disp: 270 tablet, Rfl: 3 .  PRADAXA 150 MG CAPS capsule, TAKE (1) CAPSULE TWICE DAILY., Disp: 180 capsule, Rfl: 1 .  triamcinolone cream (KENALOG) 0.1 %, Apply 1 application topically daily as needed for dry skin., Disp: , Rfl:  .  valACYclovir (VALTREX) 500 MG tablet, Take 1 tablet (500 mg total) by mouth 2 (two) times daily., Disp: 60 tablet, Rfl: 5

## 2018-09-30 NOTE — Telephone Encounter (Signed)
Patient is coming in with daughter today for an appointment, according to wife.

## 2018-09-30 NOTE — Patient Instructions (Signed)
Allergic rhinitis: I think it is a good idea for you to go back to see the allergist and consider allergy injections Continue Dymista Continue Zyrtec Continue Singulair  Gastroesophageal reflux disease: Continue pantoprazole and famotidine as you are doing  Mild intermittent asthma: I agree with holding Symbicort Use albuterol as needed, Xopenex as needed is also okay  We will see you back in 3 to 4 months or sooner if needed

## 2018-09-30 NOTE — Telephone Encounter (Signed)
Surgery sheet given to Justin [name] today

## 2018-09-30 NOTE — Telephone Encounter (Signed)
Please schedule for first Tuesday in March thanks

## 2018-10-02 ENCOUNTER — Telehealth: Payer: Self-pay | Admitting: *Deleted

## 2018-10-02 ENCOUNTER — Telehealth: Payer: Self-pay

## 2018-10-02 ENCOUNTER — Encounter (HOSPITAL_COMMUNITY)
Admission: RE | Admit: 2018-10-02 | Discharge: 2018-10-02 | Disposition: A | Discharge status: Home / Self Care | Payer: Self-pay | Source: Ambulatory Visit | Attending: Cardiovascular Disease | Admitting: Cardiovascular Disease

## 2018-10-02 ENCOUNTER — Encounter (HOSPITAL_COMMUNITY): Payer: Self-pay

## 2018-10-02 NOTE — Telephone Encounter (Signed)
   [facility] Pre-operative Risk Assessment    Request for surgical clearance:  1. What type of surgery is being performed? R PATELLA REVISION   2. When is this surgery scheduled?  TBD ( WAITING ON CLEARANCE)   3. What type of clearance is required (medical clearance vs. Pharmacy clearance to hold med vs. Both)?  BOTH  4. Are there any medications that need to be held prior to surgery and how long? PRADAXA BASED UPON PROVIDERS RECOMMENDATIONS ON LENGTH   5. Practice name and name of physician performing surgery?  [facility] DR. DEAN   6. What is your office phone number [phone]    7.   What is your office fax number [phone]  8.   Anesthesia type (None, local, MAC, general) ? GENERAL AND ADDUCTOR BLOCK    Jeanann Lewandowsky 10/02/2018, 3:09 PM  _________________________________________________________________   (provider comments below)

## 2018-10-02 NOTE — Telephone Encounter (Signed)
Referred to [facility] clinic by Dr [name]. Spoke with patient and agreeable to monthly follow up.  Advised monitor should be by bedside in order for it to automatically transmit a report during sleep hours of 12 midnight and 6 AM.  Patient confirmed monitor is at bedside. Advised will receive a call after the transmission is reviewed to provide results.  Explained a Remote Home Transmission will be seen as daytime appointment on office summary visit sheet but the time is not relevant since all transmissions are sent at night time so there is no obligation to stay by the monitor at that time appointed time during the day. 1st ICM remote transmission scheduled for 10/07/2018.  He said he's had some problems with fluid accumulation in the past.  At last office visit with Dr [name] on 09/11/2018, Dr [name] instructed him to take 80mg  lasix, twice daily, every other day until your weight returns to 252lbs. Please take additional K when you take your extra dose of lasix.   Explained the report next week will show if he is still retaining fluid.

## 2018-10-02 NOTE — Telephone Encounter (Signed)
Patient takes Pradaxa for afib with CHADS2VASc score of 5 (age x2, CHF, HTN, CAD). Renal function is normal. Recommend holding Pradaxa for 3 days prior to knee surgery.

## 2018-10-05 ENCOUNTER — Encounter (HOSPITAL_COMMUNITY)
Admission: RE | Admit: 2018-10-05 | Discharge: 2018-10-05 | Disposition: A | Discharge status: Home / Self Care | Payer: Medicare Other | Source: Ambulatory Visit | Attending: Cardiovascular Disease | Admitting: Cardiovascular Disease

## 2018-10-05 ENCOUNTER — Encounter (HOSPITAL_COMMUNITY): Payer: Self-pay

## 2018-10-06 DIAGNOSIS — D1801 Hemangioma of skin and subcutaneous tissue: Secondary | ICD-10-CM | POA: Diagnosis not present

## 2018-10-06 DIAGNOSIS — L570 Actinic keratosis: Secondary | ICD-10-CM | POA: Diagnosis not present

## 2018-10-06 DIAGNOSIS — L814 Other melanin hyperpigmentation: Secondary | ICD-10-CM | POA: Diagnosis not present

## 2018-10-06 DIAGNOSIS — D229 Melanocytic nevi, unspecified: Secondary | ICD-10-CM | POA: Diagnosis not present

## 2018-10-06 DIAGNOSIS — L819 Disorder of pigmentation, unspecified: Secondary | ICD-10-CM | POA: Diagnosis not present

## 2018-10-06 DIAGNOSIS — L821 Other seborrheic keratosis: Secondary | ICD-10-CM | POA: Diagnosis not present

## 2018-10-06 NOTE — Telephone Encounter (Signed)
   Primary Cardiologist: [name], MD  Chart reviewed as part of pre-operative protocol coverage. Patient was contacted 10/06/2018 in reference to pre-operative risk assessment for pending surgery as outlined below.  Justin [name] was last seen on 08/13/2018 by Dr. Angelena Form.  Since that day, Justin [name] has done well and can complete 4 METS of activity without any exertional symptom.  Therefore, based on ACC/AHA guidelines, the patient would be at acceptable risk for the planned procedure without further cardiovascular testing.   I will route this recommendation to the requesting party via Epic fax function and remove from pre-op pool.  Please call with questions. Per our clinical pharmacist: Recommend holding Pradaxa for 3 days prior to knee surgery.  [place], [place] 10/06/2018, 3:10 PM

## 2018-10-07 ENCOUNTER — Encounter (HOSPITAL_COMMUNITY)
Admission: RE | Admit: 2018-10-07 | Discharge: 2018-10-07 | Disposition: A | Discharge status: Home / Self Care | Payer: Self-pay | Source: Ambulatory Visit | Attending: Cardiovascular Disease | Admitting: Cardiovascular Disease

## 2018-10-07 ENCOUNTER — Ambulatory Visit (INDEPENDENT_AMBULATORY_CARE_PROVIDER_SITE_OTHER): Payer: Medicare Other

## 2018-10-07 ENCOUNTER — Encounter (HOSPITAL_COMMUNITY): Payer: Self-pay

## 2018-10-07 DIAGNOSIS — I428 Other cardiomyopathies: Secondary | ICD-10-CM

## 2018-10-07 DIAGNOSIS — Z95810 Presence of automatic (implantable) cardiac defibrillator: Secondary | ICD-10-CM

## 2018-10-07 DIAGNOSIS — I5020 Unspecified systolic (congestive) heart failure: Secondary | ICD-10-CM

## 2018-10-07 NOTE — Progress Notes (Signed)
EPIC Encounter for ICM Monitoring  Patient Name: Justin [name] is a 85 y.o. male Date: 10/07/2018 Primary Care Physican: Laurey Morale, MD Primary Cardiologist: Angelena Form Electrophysiologist: Vergie Living Pacing:   84.8%  Today's Weight: 259 lbs (baseline 252 lbs)       Heart Failure questions reviewed.  Pt weight remains up and he has not been taking Furosemide as instructed on 10/02/2018 by Dr [name].  He has been missing dosages of Furosemide due to work schedule and eating food such as pickles.  He knows the foods are high in salt and will start cutting back on salt intake.  Advised Dr [name] wants him to return return to his base weight of 252 lbs.  Instructed to weigh daily before eating or drinking with same type of clothes on for consistency.    Report: Thoracic impedance abnormal suggesting fluid accumulation.   Prescribed: Furosemide 80 mg take one tablet twice a day alternating with 1 tablet daily.    Recommendations:  Reinforced limiting salt intake to < 2000 mg daily and taking Furosemide as prescribed.    Follow-up plan: ICM clinic phone appointment on 10/13/2018 to recheck fluid levels.     Copy of ICM check sent to Dr. [name] and Dr Angelena Form.   3 month ICM trend: 10/07/2018    1 Year ICM trend:       [name], RN 10/07/2018 4:48 PM

## 2018-10-08 ENCOUNTER — Telehealth (INDEPENDENT_AMBULATORY_CARE_PROVIDER_SITE_OTHER): Payer: Self-pay | Admitting: Orthopedic Surgery

## 2018-10-08 NOTE — Telephone Encounter (Signed)
FYI:  Patient has been cleared by Cardiologist-Dr [name]. It is recommended patient hold Pradaxa 3 days prior to knee surgery.  We are still awaiting clearance from a pulmonology standpoint.  Patient is tentatively scheduled October 27, 2018

## 2018-10-08 NOTE — Telephone Encounter (Signed)
Great thx

## 2018-10-09 ENCOUNTER — Encounter (HOSPITAL_COMMUNITY): Payer: Self-pay

## 2018-10-09 ENCOUNTER — Encounter (HOSPITAL_COMMUNITY)
Admission: RE | Admit: 2018-10-09 | Discharge: 2018-10-09 | Disposition: A | Discharge status: Home / Self Care | Payer: Self-pay | Source: Ambulatory Visit | Attending: Cardiovascular Disease | Admitting: Cardiovascular Disease

## 2018-10-09 LAB — CUP PACEART REMOTE DEVICE CHECK
Battery Remaining Longevity: 60 mo
Battery Voltage: 2.97 V
Brady Statistic AP VP Percent: 0.31 %
Brady Statistic AP VS Percent: 0.03 %
Brady Statistic AS VP Percent: 95.87 %
Brady Statistic AS VS Percent: 3.79 %
Brady Statistic RA Percent Paced: 0.33 %
Brady Statistic RV Percent Paced: 84.8 %
Date Time Interrogation Session: 20200212224236
HighPow Impedance: 71 Ohm
Implantable Lead Implant Date: 20180907
Implantable Lead Implant Date: 20180907
Implantable Lead Implant Date: 20180907
Implantable Lead Location: 753858
Implantable Lead Location: 753859
Implantable Lead Location: 753860
Implantable Lead Model: 4396
Implantable Pulse Generator Implant Date: 20180907
Lead Channel Impedance Value: 342 Ohm
Lead Channel Impedance Value: 456 Ohm
Lead Channel Impedance Value: 456 Ohm
Lead Channel Impedance Value: 589 Ohm
Lead Channel Impedance Value: 836 Ohm
Lead Channel Pacing Threshold Amplitude: 0.75 V
Lead Channel Pacing Threshold Amplitude: 0.75 V
Lead Channel Pacing Threshold Amplitude: 1.125 V
Lead Channel Pacing Threshold Pulse Width: 0.4 ms
Lead Channel Pacing Threshold Pulse Width: 0.4 ms
Lead Channel Pacing Threshold Pulse Width: 1 ms
Lead Channel Sensing Intrinsic Amplitude: 11.625 mV
Lead Channel Sensing Intrinsic Amplitude: 2.75 mV
Lead Channel Sensing Intrinsic Amplitude: 2.75 mV
Lead Channel Setting Pacing Amplitude: 1.5 V
Lead Channel Setting Pacing Amplitude: 2 V
Lead Channel Setting Pacing Amplitude: 2.5 V
Lead Channel Setting Pacing Pulse Width: 0.4 ms
Lead Channel Setting Pacing Pulse Width: 1 ms
Lead Channel Setting Sensing Sensitivity: 0.3 mV
MDC IDC MSMT LEADCHNL RA IMPEDANCE VALUE: 380 Ohm
MDC IDC MSMT LEADCHNL RV SENSING INTR AMPL: 11.625 mV

## 2018-10-12 ENCOUNTER — Telehealth (INDEPENDENT_AMBULATORY_CARE_PROVIDER_SITE_OTHER): Payer: Self-pay | Admitting: Orthopedic Surgery

## 2018-10-12 ENCOUNTER — Encounter (HOSPITAL_COMMUNITY): Payer: Self-pay

## 2018-10-12 ENCOUNTER — Encounter (HOSPITAL_COMMUNITY)
Admission: RE | Admit: 2018-10-12 | Discharge: 2018-10-12 | Disposition: A | Discharge status: Home / Self Care | Payer: Self-pay | Source: Ambulatory Visit | Attending: Cardiovascular Disease | Admitting: Cardiovascular Disease

## 2018-10-12 ENCOUNTER — Other Ambulatory Visit: Payer: Self-pay | Admitting: Primary Care

## 2018-10-12 NOTE — Telephone Encounter (Signed)
[name], please advise if it is okay to refill med for pt. Thanks! 

## 2018-10-12 NOTE — Telephone Encounter (Signed)
FYI:  Patient called to reschedule his right knee surgery from March 3rd to April 27th, 2020.  He has found a house and will be closing and moving in March.

## 2018-10-12 NOTE — Telephone Encounter (Signed)
FYI

## 2018-10-12 NOTE — Telephone Encounter (Signed)
thx

## 2018-10-13 ENCOUNTER — Ambulatory Visit (INDEPENDENT_AMBULATORY_CARE_PROVIDER_SITE_OTHER): Payer: Medicare Other

## 2018-10-13 DIAGNOSIS — I5020 Unspecified systolic (congestive) heart failure: Secondary | ICD-10-CM

## 2018-10-13 DIAGNOSIS — Z95810 Presence of automatic (implantable) cardiac defibrillator: Secondary | ICD-10-CM

## 2018-10-14 ENCOUNTER — Encounter (HOSPITAL_COMMUNITY): Payer: Self-pay

## 2018-10-14 ENCOUNTER — Encounter (HOSPITAL_COMMUNITY)
Admission: RE | Admit: 2018-10-14 | Discharge: 2018-10-14 | Disposition: A | Discharge status: Home / Self Care | Payer: Self-pay | Source: Ambulatory Visit | Attending: Cardiovascular Disease | Admitting: Cardiovascular Disease

## 2018-10-14 NOTE — Progress Notes (Signed)
EPIC Encounter for ICM Monitoring  Patient Name: Justin [name] is a 85 y.o. male Date: 10/14/2018 Primary Care Physican: Laurey Morale, MD Primary Cardiologist: Angelena Form Electrophysiologist: Vergie Living Pacing:   88.6%       Last Weight: 259 lbs (baseline 252 lbs)                                                          Attempted call to patient and unable to reach.  Left detailed message per DPR regarding transmission. Transmission reviewed.    Report: Thoracic impedance has returned close to baseline normal.  (Patient previously reported he was not taking Furosemide as prescribed.)  Prescribed: Furosemide 80 mg take one tablet twice a day alternating with 1 tablet daily.    Recommendations:  Left voice mail with ICM number and encouraged to call if experiencing any fluid symptoms.    Follow-up plan: ICM clinic phone appointment on 11/02/2018.     Copy of ICM check sent to Dr. [name] and Dr Angelena Form.   3 month ICM trend: 10/13/2018    1 Year ICM trend:       [name], RN 10/14/2018 10:48 AM

## 2018-10-16 ENCOUNTER — Encounter (HOSPITAL_COMMUNITY): Payer: Self-pay

## 2018-10-19 ENCOUNTER — Encounter (HOSPITAL_COMMUNITY): Payer: Self-pay

## 2018-10-19 ENCOUNTER — Encounter (HOSPITAL_COMMUNITY)
Admission: RE | Admit: 2018-10-19 | Discharge: 2018-10-19 | Disposition: A | Discharge status: Home / Self Care | Payer: Self-pay | Source: Ambulatory Visit | Attending: Cardiovascular Disease | Admitting: Cardiovascular Disease

## 2018-10-19 NOTE — Progress Notes (Signed)
Remote ICD transmission.   

## 2018-10-20 ENCOUNTER — Inpatient Hospital Stay (HOSPITAL_COMMUNITY): Admission: RE | Admit: 2018-10-20 | Payer: Medicare Other | Source: Ambulatory Visit

## 2018-10-21 ENCOUNTER — Encounter (HOSPITAL_COMMUNITY)
Admission: RE | Admit: 2018-10-21 | Discharge: 2018-10-21 | Disposition: A | Discharge status: Home / Self Care | Payer: Medicare Other | Source: Ambulatory Visit | Attending: Cardiovascular Disease | Admitting: Cardiovascular Disease

## 2018-10-21 ENCOUNTER — Encounter (HOSPITAL_COMMUNITY): Payer: Self-pay

## 2018-10-21 ENCOUNTER — Other Ambulatory Visit (INDEPENDENT_AMBULATORY_CARE_PROVIDER_SITE_OTHER): Payer: Self-pay | Admitting: Orthopedic Surgery

## 2018-10-21 DIAGNOSIS — Z96653 Presence of artificial knee joint, bilateral: Secondary | ICD-10-CM

## 2018-10-22 NOTE — Telephone Encounter (Signed)
Patient has now received clearance from both pulmonologist Dr Simonne Maffucci and cardiologist [name].   Patient is scheduled for right patella removal/revision on April 28th.  Vendor of the 1st TKA was [employer] (xrays available and ready for [facility]/Depuy), CPM order faxed to [facility], follow up scheduled 01-01-19. Patient aware of locations, dates & times.

## 2018-10-23 ENCOUNTER — Encounter (HOSPITAL_COMMUNITY)
Admission: RE | Admit: 2018-10-23 | Discharge: 2018-10-23 | Disposition: A | Discharge status: Home / Self Care | Payer: Self-pay | Source: Ambulatory Visit | Attending: Cardiovascular Disease | Admitting: Cardiovascular Disease

## 2018-10-23 ENCOUNTER — Encounter (HOSPITAL_COMMUNITY): Payer: Self-pay

## 2018-10-23 NOTE — Telephone Encounter (Signed)
Thx what is the blood thinner plan?

## 2018-10-26 ENCOUNTER — Encounter (HOSPITAL_COMMUNITY)
Admission: RE | Admit: 2018-10-26 | Discharge: 2018-10-26 | Disposition: A | Discharge status: Home / Self Care | Payer: Self-pay | Source: Ambulatory Visit | Attending: Cardiovascular Disease | Admitting: Cardiovascular Disease

## 2018-10-26 DIAGNOSIS — Z952 Presence of prosthetic heart valve: Secondary | ICD-10-CM | POA: Insufficient documentation

## 2018-10-26 DIAGNOSIS — I350 Nonrheumatic aortic (valve) stenosis: Secondary | ICD-10-CM | POA: Insufficient documentation

## 2018-10-27 NOTE — Telephone Encounter (Signed)
Patient's cardiologist has recommended patient to hold Pradaxa 3 days prior to knee surgery (April 28th -surgery date). Patient is aware.

## 2018-10-28 ENCOUNTER — Encounter (HOSPITAL_COMMUNITY)
Admission: RE | Admit: 2018-10-28 | Discharge: 2018-10-28 | Disposition: A | Discharge status: Home / Self Care | Payer: Self-pay | Source: Ambulatory Visit | Attending: Cardiovascular Disease | Admitting: Cardiovascular Disease

## 2018-10-28 NOTE — Telephone Encounter (Signed)
Great - thanks

## 2018-10-30 ENCOUNTER — Ambulatory Visit: Payer: Self-pay | Admitting: Allergy

## 2018-10-30 ENCOUNTER — Encounter (HOSPITAL_COMMUNITY): Payer: Self-pay

## 2018-11-02 ENCOUNTER — Ambulatory Visit (INDEPENDENT_AMBULATORY_CARE_PROVIDER_SITE_OTHER): Payer: Medicare Other

## 2018-11-02 ENCOUNTER — Encounter (HOSPITAL_COMMUNITY)
Admission: RE | Admit: 2018-11-02 | Discharge: 2018-11-02 | Disposition: A | Discharge status: Home / Self Care | Payer: Self-pay | Source: Ambulatory Visit | Attending: Cardiovascular Disease | Admitting: Cardiovascular Disease

## 2018-11-02 DIAGNOSIS — I5020 Unspecified systolic (congestive) heart failure: Secondary | ICD-10-CM | POA: Diagnosis not present

## 2018-11-02 DIAGNOSIS — Z95810 Presence of automatic (implantable) cardiac defibrillator: Secondary | ICD-10-CM

## 2018-11-03 ENCOUNTER — Other Ambulatory Visit: Payer: Self-pay | Admitting: Cardiovascular Disease

## 2018-11-03 ENCOUNTER — Other Ambulatory Visit: Payer: Self-pay | Admitting: Primary Care

## 2018-11-03 ENCOUNTER — Other Ambulatory Visit: Payer: Self-pay | Admitting: Family Medicine

## 2018-11-04 ENCOUNTER — Encounter (HOSPITAL_COMMUNITY)
Admission: RE | Admit: 2018-11-04 | Discharge: 2018-11-04 | Disposition: A | Discharge status: Home / Self Care | Payer: Self-pay | Source: Ambulatory Visit | Attending: Cardiovascular Disease | Admitting: Cardiovascular Disease

## 2018-11-04 ENCOUNTER — Other Ambulatory Visit: Payer: Self-pay

## 2018-11-04 NOTE — Progress Notes (Signed)
EPIC Encounter for ICM Monitoring  Patient Name: Justin [name] is a 85 y.o. male Date: 11/04/2018 Primary Care Physican: Laurey Morale, MD Primary Cardiologist:[place] Electrophysiologist:Klein Bi-V Pacing:91.6% Last Weight:258.2 lbs (baseline 255 lb - 258 lb)  Heart failure questions reviewed and pt is asymptomatic.  Report: Thoracic impedance has returned close to baseline normal.  (Patient previously reported he was not taking Furosemide as prescribed.)  Prescribed: Furosemide80 mg take 1 tablets (80 mg total) by mouth once daily every other day, alternating with twice daily on alternate days  Recommendations:No changes and encouraged to call for fluid symptoms.  Follow-up plan: ICM clinic phone appointment on4/20/2020. Office visit with Dr [name] 11/09/2018.  Copy of ICM check sent to [place].  3 month ICM trend: 11/02/2018    1 Year ICM trend:       [name], RN 11/04/2018 9:53 AM

## 2018-11-06 ENCOUNTER — Encounter (HOSPITAL_COMMUNITY): Payer: Self-pay

## 2018-11-09 ENCOUNTER — Other Ambulatory Visit: Payer: Self-pay

## 2018-11-09 ENCOUNTER — Ambulatory Visit (INDEPENDENT_AMBULATORY_CARE_PROVIDER_SITE_OTHER): Payer: Medicare Other | Admitting: Internal Medicine

## 2018-11-09 ENCOUNTER — Encounter: Payer: Self-pay | Admitting: Internal Medicine

## 2018-11-09 ENCOUNTER — Telehealth (HOSPITAL_COMMUNITY): Payer: Self-pay

## 2018-11-09 ENCOUNTER — Encounter (HOSPITAL_COMMUNITY): Payer: Self-pay

## 2018-11-09 VITALS — BP 102/64 | HR 61 | Ht 67.0 in | Wt 264.0 lb

## 2018-11-09 DIAGNOSIS — Z95810 Presence of automatic (implantable) cardiac defibrillator: Secondary | ICD-10-CM

## 2018-11-09 DIAGNOSIS — I5020 Unspecified systolic (congestive) heart failure: Secondary | ICD-10-CM | POA: Diagnosis not present

## 2018-11-09 DIAGNOSIS — I428 Other cardiomyopathies: Secondary | ICD-10-CM | POA: Diagnosis not present

## 2018-11-09 DIAGNOSIS — I493 Ventricular premature depolarization: Secondary | ICD-10-CM

## 2018-11-09 DIAGNOSIS — I472 Ventricular tachycardia, unspecified: Secondary | ICD-10-CM

## 2018-11-09 NOTE — Progress Notes (Signed)
Patient Care Team: Laurey Morale, MD as PCP - General Angelena Form, [name], MD as PCP - Cardiology (Cardiology) Festus Aloe, MD as Consulting Physician (Urology)   HPI  Justin [name] is a 85 y.o. male Seen for follow-up of a CRT ICD 9/18 implanted because of syncope  and new left bundle branch block following TAVR    Much improved with CRT     [place] trip around the world     1/18 Cath >>SVG patent   Prior CABG 2007    DATE TEST EF   8/18 Echo   40-45 %   6/19 Echo   40-45 %         Had problems with volume and enrolled in ICM clinic w improvement  More recently weight and fluid gain again--   No chest pain     Records and Results Reviewed  Past Medical History:  Diagnosis Date  . Age-related macular degeneration, dry, both eyes   . Allergic    "24/7; 365 days/year; I'm allergic to pollens, dust, all southern grasses/trees, mold, mildue, cats, dogs" (01/07/2017)  . Anal fissure   . Asthma    sees [name]   . Benign prostatic hypertrophy    (sees Dr. Denman George  . CAD (coronary artery disease)    a. s/p CABG 2007. b. Cath 08/2016 - 4/4 patent grafts.  . Carotid bruit    carotid u/s 10/10: 0.39% bilaterally  . Chronic combined systolic and diastolic CHF (congestive heart failure) ([place])   . Complication of anesthesia 1980s   "w/anal cyst OR, he gave me a saddle block then put a narcotic in spinal cord; had a severe reaction to that" (01/07/2017)  . Congestive heart failure (CHF) ([place])   . ED (erectile dysfunction)   . Family history of adverse reaction to anesthesia    "daughter wakes up during OR" (01/07/2017)  . GERD (gastroesophageal reflux disease)   . Gout   . HTN (hypertension)   . Hx of colonic polyps    (sees Dr. Henrene Pastor)  . Hyperlipidemia   . Hypothyroidism   . Moderate to severe aortic stenosis    a. s/p TAVR 02/2017.  [name] Myocardial infarction ([employer]) ~ 2000  . Obesity   . Osteoarthritis    "was in my knees, hands" (01/07/2017 )   . PAF (paroxysmal atrial fibrillation) ([place])    a. documented post TAVR.  [name] Precancerous skin lesion    (sees Dr. Allyson Sabal)  . Prostate cancer ([place]) dx'd ~ 2014  . 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 ANUS SURGERY     "opened it back up cause it wouldn't heal; wound up w/a fissure" (01/07/2017)  . BIV ICD INSERTION CRT-D N/A 05/02/2017   Procedure: BIV ICD INSERTION CRT-D;  Surgeon: Deboraha Sprang, MD;  Location: [facility];  Service: Cardiovascular;  Laterality: N/A;  . CARDIAC CATHETERIZATION  10/29/2005  . CARDIAC CATHETERIZATION N/A 08/27/2016   Procedure: Right/Left Heart Cath and Coronary/Graft Angiography;  Surgeon: Burnell Blanks, MD;  Location: [facility];  Service: Cardiovascular;  Laterality: N/A;  . CATARACT EXTRACTION W/ INTRAOCULAR LENS  IMPLANT, BILATERAL Bilateral   . CATARACT EXTRACTION, BILATERAL  2012  . COLONOSCOPY  06/30/2008   no repeats needed   .  COLONOSCOPY     had 3 or 4 in the past   . CORONARY ARTERY BYPASS GRAFT  2007   "CABG X4"  . CYST EXCISION PERINEAL  1980s  . HAMMER TOE SURGERY Bilateral   . JOINT REPLACEMENT    . KNEE ARTHROPLASTY  07/30/2011   Procedure: COMPUTER ASSISTED TOTAL KNEE ARTHROPLASTY;  Surgeon: Meredith Pel;  Location: [facility];  Service: Orthopedics;  Laterality: Left;  left total knee arthroplasty  . MASTECTOMY SUBCUTANEOUS Bilateral   . ORIF FINGER / THUMB FRACTURE Right ~ 1980   "repair of thumb injury"  . PROSTATE BIOPSY    . REPLACEMENT TOTAL KNEE BILATERAL Bilateral 2012  . TEE WITHOUT CARDIOVERSION N/A 03/04/2017   Procedure: TRANSESOPHAGEAL ECHOCARDIOGRAM (TEE);  Surgeon: Burnell Blanks, MD;  Location: [facility];  Service: Open Heart Surgery;  Laterality: N/A;  . TRANSCATHETER AORTIC VALVE  REPLACEMENT, TRANSFEMORAL N/A 03/04/2017   Procedure: TRANSCATHETER AORTIC VALVE REPLACEMENT, TRANSFEMORAL;  Surgeon: Burnell Blanks, MD;  Location: [facility];  Service: Open Heart Surgery;  Laterality: N/A;    Current Outpatient Medications  Medication Sig Dispense Refill  . albuterol (PROVENTIL HFA;VENTOLIN HFA) 108 (90 Base) MCG/ACT inhaler Inhale 2 puffs into the lungs every 6 (six) hours as needed for wheezing or shortness of breath. 1 Inhaler 6  . aspirin 81 MG tablet Take 1 tablet (81 mg total) by mouth daily. 30 tablet 0  . atorvastatin (LIPITOR) 20 MG tablet TAKE 1 TABLET ONCE DAILY. 90 tablet 1  . Azelastine-Fluticasone (DYMISTA) 137-50 MCG/ACT SUSP USE 2 SPRAYS EACH NOSTRIL TWICE DAILY. 23 g 11  . benzonatate (TESSALON) 200 MG capsule TAKE 1 CAPSULE EVERY 8 HOURS AS NEEDED FOR COUGH. 45 capsule 0  . cetirizine (ZYRTEC) 10 MG tablet Take 10 mg by mouth daily.    . chlorpheniramine (CHLOR-TRIMETON) 4 MG tablet Take 8 mg by mouth at bedtime.    . famotidine (PEPCID) 10 MG tablet Take 10 mg by mouth at bedtime.    . furosemide (LASIX) 80 MG tablet TAKE ONE TABLET ONCE A DAY ALTERNATING WITH ONE TABLET TWICE A DAY. 135 tablet 3  . HYDROcodone-homatropine (HYCODAN) 5-1.5 MG/5ML syrup Take 5 mLs by mouth at bedtime and may repeat dose one time if needed. 240 mL 0  . ipratropium (ATROVENT) 0.03 % nasal spray Place 2 sprays into both nostrils every 6 (six) hours as needed for rhinitis. 30 mL 4  . isosorbide mononitrate (IMDUR) 30 MG 24 hr tablet Take 1 tablet (30 mg total) by mouth daily. 90 tablet 1  . Ketotifen Fumarate (ZADITOR OP) Apply 1 drop to eye daily as needed (itchy eyes).     [name] levothyroxine (SYNTHROID, LEVOTHROID) 75 MCG tablet Take 1 tablet (75 mcg total) by mouth daily. (Patient taking differently: Take 75 mcg by mouth daily before breakfast. ) 90 tablet 3  . losartan (COZAAR) 25 MG tablet TAKE 1 TABLET EACH DAY. (Patient taking differently: Take 25 mg by mouth daily. ) 90  tablet 1  . metoprolol tartrate (LOPRESSOR) 25 MG tablet TAKE 1 TABLET BY MOUTH TWICE DAILY. 180 tablet 1  . montelukast (SINGULAIR) 10 MG tablet Take 1 tablet (10 mg total) by mouth every evening. 90 tablet 4  . Multiple Vitamins-Minerals (PRESERVISION AREDS PO) Take 2 tablets by mouth every morning.    [name] omeprazole (PRILOSEC) 20 MG capsule Take 1 capsule (20 mg total) by mouth 2 (two) times daily. 180 capsule 1  . potassium chloride SA (K-DUR,KLOR-CON) 20 MEQ tablet Take  2 tablets by mouth twice daily alternating with 2 tablets daily. (Patient taking differently: Take 40 mEq by mouth See admin instructions. Take 40 mg by mouth once daily every other day, alternating with twice daily on alternate days) 270 tablet 3  . PRADAXA 150 MG CAPS capsule TAKE (1) CAPSULE TWICE DAILY. (Patient taking differently: Take 150 mg by mouth 2 (two) times daily. ) 180 capsule 1  . triamcinolone cream (KENALOG) 0.1 % Apply 1 application topically daily as needed for dry skin.    . valACYclovir (VALTREX) 500 MG tablet Take 1 tablet (500 mg total) by mouth 2 (two) times daily. (Patient taking differently: Take 500 mg by mouth 2 (two) times daily as needed (cold sores). ) 60 tablet 5   No current facility-administered medications for this visit.     Allergies  Allergen Reactions  . Peanut-Containing Drug Products Anaphylaxis  . Sulfonamide Derivatives Anaphylaxis  . Amlodipine Swelling    Swelling in ankles  . Eliquis [Apixaban] Other (See Comments)    Back/hip pain  . Lisinopril Cough    cough  . Xarelto [Rivaroxaban] Other (See Comments)    Back/hip pain      Review of Systems negative except from HPI and PMH  Physical Exam BP 102/64   Pulse 61   Ht 5\' 7"  (1.702 m)   Wt 264 lb (119.7 kg)   SpO2 97%   BMI 41.35 kg/m  Well developed and well nourished in no acute distress HENT normal Neck supple with JVP-flat Clear Device pocket well healed; without hematoma or erythema.  There is no tethering   Regular rate and rhythm, no  gallop No murmur Abd-soft with active BS No Clubbing cyanosis 1+ edema Skin-warm and dry A & Oriented  Grossly normal sensory and motor function  ECG AV pacing upright QRS V1 and neg QRS Lead 1  PVCs infrequent    Assessment and  Plan  Ventricular tachycardia- prolonged- nonsustained  Sinus node dysfunction  Junctional rhythm  Post TAVR left bundle branch block/IVCD  Cardiomyopathy EF 40-45%  CRT- D Medtronic  The patient's device was interrogated.  The information was reviewed. No changes were made in the programming.     CHF chronic/acute systolic/diastolic   Morbidly obese  PVCs-infrequent  We have discussed the physiology of heart failure including the importance of salt restriction and fluid restriction and have reviewed sources of dietary salt and water and physiology of systolic and   Discussed the importance of weight loss   PVCs quiescent  Will increase diuretics   We spent more than 50% of our >25 min visit in face to face counseling regarding the above

## 2018-11-09 NOTE — Telephone Encounter (Signed)
Pt called and message left informing pt of Cardiac and Pulmonary rehab closure for the next 2 weeks.   Lashell Moffitt RN, BSN Cardiac and Pulmonary Rehab RN  

## 2018-11-09 NOTE — Patient Instructions (Signed)
Medication Instructions:  Your physician has recommended you make the following change in your medication:   None  Labwork: None ordered.  Testing/Procedures: None ordered.  Follow-Up: Your physician recommends that you schedule a follow-up appointment in:   One Year with Dr [name]  Any Other Special Instructions Will Be Listed Below (If Applicable).     If you need a refill on your cardiac medications before your next appointment, please call your pharmacy.

## 2018-11-10 LAB — CUP PACEART INCLINIC DEVICE CHECK
Date Time Interrogation Session: 20200317104649
Implantable Lead Implant Date: 20180907
Implantable Lead Implant Date: 20180907
Implantable Lead Implant Date: 20180907
Implantable Lead Location: 753858
Implantable Lead Location: 753859
Implantable Lead Location: 753860
Implantable Lead Model: 4396
Implantable Lead Model: 5076
Implantable Pulse Generator Implant Date: 20180907

## 2018-11-10 NOTE — Telephone Encounter (Signed)
Fine to refill. Thanks

## 2018-11-10 NOTE — Telephone Encounter (Signed)
[name] please advise on refill. Thank you.

## 2018-11-11 ENCOUNTER — Encounter (HOSPITAL_COMMUNITY): Payer: Self-pay

## 2018-11-11 ENCOUNTER — Inpatient Hospital Stay (INDEPENDENT_AMBULATORY_CARE_PROVIDER_SITE_OTHER): Payer: Medicare Other | Admitting: Orthopedic Surgery

## 2018-11-13 ENCOUNTER — Encounter (HOSPITAL_COMMUNITY): Payer: Self-pay

## 2018-11-16 ENCOUNTER — Encounter (HOSPITAL_COMMUNITY): Payer: Self-pay

## 2018-11-17 ENCOUNTER — Telehealth (HOSPITAL_COMMUNITY): Payer: Self-pay | Admitting: Family Medicine

## 2018-11-17 NOTE — Telephone Encounter (Signed)
Referred already

## 2018-11-18 ENCOUNTER — Encounter (HOSPITAL_COMMUNITY): Payer: Self-pay

## 2018-11-19 ENCOUNTER — Ambulatory Visit: Payer: Self-pay | Admitting: Allergy

## 2018-11-20 ENCOUNTER — Encounter (HOSPITAL_COMMUNITY): Payer: Self-pay

## 2018-11-23 ENCOUNTER — Encounter (HOSPITAL_COMMUNITY): Payer: Self-pay

## 2018-11-25 ENCOUNTER — Encounter (HOSPITAL_COMMUNITY): Payer: Self-pay

## 2018-11-27 ENCOUNTER — Encounter (HOSPITAL_COMMUNITY): Payer: Self-pay

## 2018-11-27 ENCOUNTER — Other Ambulatory Visit: Payer: Self-pay | Admitting: Cardiovascular Disease

## 2018-11-30 ENCOUNTER — Encounter (HOSPITAL_COMMUNITY): Payer: Self-pay

## 2018-12-02 ENCOUNTER — Encounter (HOSPITAL_COMMUNITY): Payer: Self-pay

## 2018-12-03 ENCOUNTER — Telehealth (HOSPITAL_COMMUNITY): Payer: Self-pay | Admitting: Cardiac Rehabilitation

## 2018-12-03 NOTE — Telephone Encounter (Signed)
Pt phone call to inform of continued Outpatient Cardiac Rehab departmental closure for COVID 19 precautions. Future opening date to be determined. Pt instructed to continue exercising on his own following home exercise guidelines.Heis currently in process in moving so getting a lot of steps. Pt advised to contact cardiology or PCP PRN symptoms, questions or concerns. Understanding verbalized. Pt denies food insecurity or other needs at this time. Pt offered emotional support and understanding. Pt expressed gratitude for the call and is interested in resuming CRPII when available.  Andi Hence, RN, BSN Cardiac Pulmonary Rehab

## 2018-12-04 ENCOUNTER — Encounter (HOSPITAL_COMMUNITY): Payer: Self-pay

## 2018-12-07 ENCOUNTER — Encounter (HOSPITAL_COMMUNITY): Payer: Self-pay

## 2018-12-09 ENCOUNTER — Encounter (HOSPITAL_COMMUNITY): Payer: Self-pay

## 2018-12-10 ENCOUNTER — Encounter: Payer: Medicare Other | Admitting: Family Medicine

## 2018-12-11 ENCOUNTER — Encounter (HOSPITAL_COMMUNITY): Payer: Self-pay

## 2018-12-14 ENCOUNTER — Encounter (HOSPITAL_COMMUNITY): Payer: Self-pay

## 2018-12-14 ENCOUNTER — Other Ambulatory Visit: Payer: Self-pay | Admitting: Cardiovascular Disease

## 2018-12-14 ENCOUNTER — Other Ambulatory Visit: Payer: Self-pay

## 2018-12-14 NOTE — Telephone Encounter (Signed)
SCr 0.73 on 12/30/17, age 85, weight 120kg, CrCl 191mL/min

## 2018-12-16 ENCOUNTER — Encounter (HOSPITAL_COMMUNITY): Payer: Self-pay

## 2018-12-18 ENCOUNTER — Encounter (HOSPITAL_COMMUNITY): Payer: Self-pay

## 2018-12-18 ENCOUNTER — Telehealth: Payer: Self-pay

## 2018-12-18 NOTE — Telephone Encounter (Signed)
Left message for patient to remind of missed remote transmission.  

## 2018-12-20 ENCOUNTER — Other Ambulatory Visit: Payer: Self-pay | Admitting: Primary Care

## 2018-12-21 ENCOUNTER — Encounter: Payer: Self-pay | Admitting: Allergy

## 2018-12-21 ENCOUNTER — Encounter (HOSPITAL_COMMUNITY): Payer: Self-pay

## 2018-12-21 NOTE — Telephone Encounter (Signed)
Justin [name], is this appropriate to refill

## 2018-12-21 NOTE — Progress Notes (Signed)
No ICM remote transmission received for 12/14/2018 and next ICM transmission scheduled for 01/05/2019.

## 2018-12-21 NOTE — Telephone Encounter (Signed)
Yes, thank you.

## 2018-12-22 ENCOUNTER — Inpatient Hospital Stay: Admit: 2018-12-22 | Payer: Medicare Other | Admitting: Orthopedic Surgery

## 2018-12-22 ENCOUNTER — Other Ambulatory Visit: Payer: Self-pay

## 2018-12-22 ENCOUNTER — Ambulatory Visit (INDEPENDENT_AMBULATORY_CARE_PROVIDER_SITE_OTHER): Payer: Medicare Other

## 2018-12-22 DIAGNOSIS — Z95810 Presence of automatic (implantable) cardiac defibrillator: Secondary | ICD-10-CM

## 2018-12-22 DIAGNOSIS — I5020 Unspecified systolic (congestive) heart failure: Secondary | ICD-10-CM

## 2018-12-22 SURGERY — TOTAL KNEE REVISION
Anesthesia: General | Laterality: Right

## 2018-12-23 ENCOUNTER — Encounter (HOSPITAL_COMMUNITY): Payer: Self-pay

## 2018-12-23 NOTE — Progress Notes (Signed)
opended in error

## 2018-12-25 ENCOUNTER — Encounter (HOSPITAL_COMMUNITY): Payer: Self-pay

## 2018-12-28 ENCOUNTER — Encounter (HOSPITAL_COMMUNITY): Payer: Self-pay

## 2018-12-29 ENCOUNTER — Encounter: Payer: Medicare Other | Admitting: Family Medicine

## 2018-12-30 ENCOUNTER — Encounter (HOSPITAL_COMMUNITY): Payer: Self-pay

## 2018-12-30 DIAGNOSIS — H353132 Nonexudative age-related macular degeneration, bilateral, intermediate dry stage: Secondary | ICD-10-CM | POA: Diagnosis not present

## 2018-12-30 DIAGNOSIS — Z961 Presence of intraocular lens: Secondary | ICD-10-CM | POA: Diagnosis not present

## 2018-12-30 DIAGNOSIS — H524 Presbyopia: Secondary | ICD-10-CM | POA: Diagnosis not present

## 2018-12-31 ENCOUNTER — Ambulatory Visit (INDEPENDENT_AMBULATORY_CARE_PROVIDER_SITE_OTHER): Payer: Medicare Other | Admitting: Allergy

## 2018-12-31 ENCOUNTER — Other Ambulatory Visit: Payer: Self-pay

## 2018-12-31 ENCOUNTER — Encounter: Payer: Self-pay | Admitting: Allergy

## 2018-12-31 VITALS — BP 132/68 | HR 76 | Temp 97.8°F | Resp 18

## 2018-12-31 DIAGNOSIS — J3089 Other allergic rhinitis: Secondary | ICD-10-CM | POA: Diagnosis not present

## 2018-12-31 MED ORDER — EPINEPHRINE 0.3 MG/0.3ML IJ SOAJ: 0.3000 mg | INTRAMUSCULAR | 1 refills | Status: DC | PRN

## 2018-12-31 NOTE — Patient Instructions (Addendum)
Allergic rhinitis  -  environmental allergy panel via serum IgE levels showed elevated IgE levels to aspergillus and sycamore tree  -  Environmental allergy skin testing today is pigweed, aspergillus, dust mites and cockroach  - at this time continue your current routine with zyrtec, singulair, dymista and saline nasal spray  - will proceed with allergen immunotherapy.  Protocol, benefits and risks have been discussed. Consent completed today.  Schedule a start injection appointment.  Will prescribe epinephrine device to bring with you on days of your injections.    Follow-up 6 months or sooner if needed

## 2018-12-31 NOTE — Progress Notes (Signed)
Follow-up Note  RE: Justin [name] MRN: [id number] DOB: [birth year] Date of Office Visit: 12/31/2018   History of present illness: Justin [name] is a 85 y.o. male presenting today for skin testing visit.  He was seen for initial visit on 09/24/18.  At this time he reported interested in undergoing immunotherapy.  He also states that his pulmonary doctor, [name] agrees he may benefit from immunotherapy at this time.  He has been on off his zyrtec for past 3 days and he states he has had increased in allergy symptoms including sneezing and congestion.   Review of systems: Review of Systems  Constitutional: Negative for chills, fever and malaise/fatigue.  HENT: Positive for congestion and nosebleeds. Negative for ear discharge, ear pain and sinus pain.   Eyes: Negative for pain, discharge and redness.  Respiratory: Negative for cough, shortness of breath and wheezing.   Cardiovascular: Negative for chest pain.  Gastrointestinal: Negative for abdominal pain, constipation, diarrhea, heartburn, nausea and vomiting.  Musculoskeletal: Positive for joint pain.  Skin: Negative for itching and rash.  Neurological: Negative for headaches.    All other systems negative unless noted above in HPI  Past medical/social/surgical/family history have been reviewed and are unchanged unless specifically indicated below.  No changes  Medication List: Allergies as of 12/31/2018      Reactions   Peanut-containing Drug Products Anaphylaxis   Sulfonamide Derivatives Anaphylaxis   Amlodipine Swelling   Swelling in ankles   Eliquis [apixaban] Other (See Comments)   Back/hip pain   Lisinopril Cough   cough   Xarelto [rivaroxaban] Other (See Comments)   Back/hip pain      Medication List       Accurate as of Dec 31, 2018 10:57 AM. If you have any questions, ask your nurse or doctor.        STOP taking these medications   albuterol 108 (90 Base) MCG/ACT inhaler Commonly known as:   VENTOLIN HFA Stopped by:  Jany Buckwalter Charmian Muff, MD     TAKE these medications   aspirin 81 MG tablet Take 1 tablet (81 mg total) by mouth daily.   atorvastatin 20 MG tablet Commonly known as:  LIPITOR Take 20 mg by mouth daily.   Azelastine-Fluticasone 137-50 MCG/ACT Susp Commonly known as:  Dymista USE 2 SPRAYS EACH NOSTRIL TWICE DAILY.   benzonatate 200 MG capsule Commonly known as:  TESSALON Take 200 mg by mouth 3 (three) times daily as needed for cough. What changed:  Another medication with the same name was removed. Continue taking this medication, and follow the directions you see here. Changed by:  Zyon Rosser Charmian Muff, MD   cetirizine 10 MG tablet Commonly known as:  ZYRTEC Take 10 mg by mouth daily.   chlorpheniramine 4 MG tablet Commonly known as:  CHLOR-TRIMETON Take 8 mg by mouth at bedtime.   dabigatran 150 MG Caps capsule Commonly known as:  Pradaxa Take 1 capsule (150 mg total) by mouth 2 (two) times daily.   famotidine 10 MG tablet Commonly known as:  PEPCID Take 10 mg by mouth at bedtime.   furosemide 80 MG tablet Commonly known as:  LASIX Take 80 mg by mouth 2 (two) times daily. TAKE ONE TABLET ONCE A DAY ALTERNATING WITH ONE TABLET TWICE A DAY.   HYDROcodone-homatropine 5-1.5 MG/5ML syrup Commonly known as:  HYCODAN Take 5 mLs by mouth at bedtime and may repeat dose one time if needed.   ipratropium 0.03 % nasal spray Commonly known  as:  ATROVENT Place 2 sprays into both nostrils every 6 (six) hours as needed for rhinitis.   isosorbide mononitrate 30 MG 24 hr tablet Commonly known as:  IMDUR Take 1 tablet (30 mg total) by mouth daily.   levothyroxine 75 MCG tablet Commonly known as:  SYNTHROID Take 1 tablet (75 mcg total) by mouth daily.   losartan 25 MG tablet Commonly known as:  COZAAR TAKE 1 TABLET EACH DAY.   metoprolol tartrate 25 MG tablet Commonly known as:  LOPRESSOR Take 25 mg by mouth 2 (two) times daily.    montelukast 10 MG tablet Commonly known as:  SINGULAIR Take 1 tablet (10 mg total) by mouth every evening.   omeprazole 20 MG capsule Commonly known as:  PRILOSEC Take 1 capsule (20 mg total) by mouth 2 (two) times daily.   potassium chloride SA 20 MEQ tablet Commonly known as:  K-DUR Take 2 tablets by mouth twice daily alternating with 2 tablets daily.   PRESERVISION AREDS PO Take 2 tablets by mouth every morning.   triamcinolone cream 0.1 % Commonly known as:  KENALOG Apply 1 application topically daily as needed for dry skin.   valACYclovir 500 MG tablet Commonly known as:  VALTREX Take 500 mg by mouth 2 (two) times daily as needed (cold sores).   ZADITOR OP Apply 1 drop to eye daily as needed (itchy eyes).       Known medication allergies: Allergies  Allergen Reactions  . Peanut-Containing Drug Products Anaphylaxis  . Sulfonamide Derivatives Anaphylaxis  . Amlodipine Swelling    Swelling in ankles  . Eliquis [Apixaban] Other (See Comments)    Back/hip pain  . Lisinopril Cough    cough  . Xarelto [Rivaroxaban] Other (See Comments)    Back/hip pain     Physical examination: Blood pressure 132/68, pulse 76, temperature 97.8 F (36.6 C), temperature source Oral, resp. rate 18, SpO2 97 %.  General: Alert, interactive, in no acute distress. HEENT: PERRLA, TMs pearly gray, turbinates mildly edematous without discharge, post-pharynx non erythematous. Neck: Supple without lymphadenopathy. Lungs: Clear to auscultation without wheezing, rhonchi or rales. {no increased work of breathing. CV: Normal S1, S2 without murmurs. Abdomen: Nondistended, nontender. Skin: Warm and dry, without lesions or rashes. Extremities:  No clubbing, cyanosis or edema. Neuro:   Grossly intact.  Diagnositics/Labs: Labs:  Component     Latest Ref Rng & Units 09/24/2018  IgE (Immunoglobulin E), Serum     6 - 495 IU/mL 118  D Pteronyssinus IgE     Class 0 kU/L <0.10  D Farinae IgE      Class 0 kU/L <0.10  Cat Dander IgE     Class 0 kU/L <0.10  Dog Dander IgE     Class 0 kU/L <0.10  [place] Grass IgE     Class 0 kU/L <0.10  Timothy Grass IgE     Class 0 kU/L <0.10  [place] Grass IgE     Class 0 kU/L <0.10  Bahia Grass IgE     Class 0 kU/L <0.10  Cockroach, American IgE     Class 0 kU/L <0.10  Penicillium Chrysogen IgE     Class 0 kU/L <0.10  Cladosporium Herbarum IgE     Class 0 kU/L <0.10  Aspergillus Fumigatus IgE     Class II kU/L 1.24 (A)  Mucor Racemosus IgE     Class 0 kU/L <0.10  Alternaria Alternata IgE     Class 0 kU/L <0.10  Stemphylium Herbarum IgE  Class 0 kU/L <0.10  Common Silver Wendee Copp IgE     Class 0 kU/L <0.10  Oak, White IgE     Class 0 kU/L <0.10  Elm, American IgE     Class 0 kU/L <0.10  Maple/Box Elder IgE     Class 0 kU/L <0.10  Hickory, White IgE     Class 0 kU/L <0.10  Amer Sycamore IgE Qn     Class 0/I kU/L 0.14 (A)  White Mulberry IgE     Class 0 kU/L <0.10  Sweet gum IgE RAST Ql     Class 0 kU/L <0.10  Cedar, [place] IgE     Class 0 kU/L <0.10  Ragweed, Short IgE     Class 0 kU/L <0.10  Mugwort IgE Qn     Class 0 kU/L <0.10  Plantain, English IgE     Class 0 kU/L <0.10  Pigweed, Rough IgE     Class 0 kU/L <0.10  Sheep Sorrel IgE Qn     Class 0 kU/L <0.10  Nettle IgE     Class 0 kU/L <0.10    Assessment and plan:   Allergic rhinitis  -  environmental allergy panel via serum IgE levels showed elevated IgE levels to aspergillus and sycamore tree  -  Environmental allergy skin testing today is pigweed, aspergillus, dust mites and cockroach  - at this time continue your current routine with zyrtec, singulair, dymista and saline nasal spray  - will proceed with allergen immunotherapy.  Protocol, benefits and risks have been discussed. Consent completed today.  Schedule a start injection appointment.  Will prescribe epinephrine device to bring with you on days of your injections.    Follow-up 6 months or  sooner if needed  I appreciate the opportunity to take part in Blandon's care. Please do not hesitate to contact me with questions.  Sincerely,   Prudy Feeler, MD Allergy/Immunology [facility]

## 2019-01-01 ENCOUNTER — Encounter (HOSPITAL_COMMUNITY): Payer: Self-pay

## 2019-01-01 ENCOUNTER — Inpatient Hospital Stay (INDEPENDENT_AMBULATORY_CARE_PROVIDER_SITE_OTHER): Payer: Medicare Other | Admitting: Orthopedic Surgery

## 2019-01-04 ENCOUNTER — Encounter (HOSPITAL_COMMUNITY): Payer: Self-pay

## 2019-01-05 ENCOUNTER — Other Ambulatory Visit: Payer: Self-pay

## 2019-01-05 ENCOUNTER — Ambulatory Visit (INDEPENDENT_AMBULATORY_CARE_PROVIDER_SITE_OTHER): Payer: Medicare Other

## 2019-01-05 DIAGNOSIS — I5020 Unspecified systolic (congestive) heart failure: Secondary | ICD-10-CM | POA: Diagnosis not present

## 2019-01-05 DIAGNOSIS — Z95810 Presence of automatic (implantable) cardiac defibrillator: Secondary | ICD-10-CM

## 2019-01-05 NOTE — Progress Notes (Signed)
VIALS EXP 01-05-2020

## 2019-01-06 ENCOUNTER — Ambulatory Visit (INDEPENDENT_AMBULATORY_CARE_PROVIDER_SITE_OTHER): Payer: Medicare Other | Admitting: *Deleted

## 2019-01-06 ENCOUNTER — Encounter (HOSPITAL_COMMUNITY): Payer: Self-pay

## 2019-01-06 ENCOUNTER — Other Ambulatory Visit: Payer: Self-pay

## 2019-01-06 DIAGNOSIS — I5020 Unspecified systolic (congestive) heart failure: Secondary | ICD-10-CM

## 2019-01-06 DIAGNOSIS — I472 Ventricular tachycardia, unspecified: Secondary | ICD-10-CM

## 2019-01-06 DIAGNOSIS — J3089 Other allergic rhinitis: Secondary | ICD-10-CM | POA: Diagnosis not present

## 2019-01-07 DIAGNOSIS — J301 Allergic rhinitis due to pollen: Secondary | ICD-10-CM | POA: Diagnosis not present

## 2019-01-07 LAB — CUP PACEART REMOTE DEVICE CHECK
Battery Remaining Longevity: 53 mo
Battery Voltage: 2.96 V
Brady Statistic AP VP Percent: 1.87 %
Brady Statistic AP VS Percent: 0.02 %
Brady Statistic AS VP Percent: 95.95 %
Brady Statistic AS VS Percent: 2.15 %
Brady Statistic RA Percent Paced: 1.87 %
Brady Statistic RV Percent Paced: 90.71 %
Date Time Interrogation Session: 20200512092604
HighPow Impedance: 83 Ohm
Implantable Lead Implant Date: 20180907
Implantable Lead Implant Date: 20180907
Implantable Lead Implant Date: 20180907
Implantable Lead Location: 753858
Implantable Lead Location: 753859
Implantable Lead Location: 753860
Implantable Lead Model: 4396
Implantable Lead Model: 5076
Implantable Pulse Generator Implant Date: 20180907
Lead Channel Impedance Value: 380 Ohm
Lead Channel Impedance Value: 380 Ohm
Lead Channel Impedance Value: 456 Ohm
Lead Channel Impedance Value: 494 Ohm
Lead Channel Impedance Value: 627 Ohm
Lead Channel Impedance Value: 855 Ohm
Lead Channel Pacing Threshold Amplitude: 0.75 V
Lead Channel Pacing Threshold Amplitude: 1 V
Lead Channel Pacing Threshold Amplitude: 1.125 V
Lead Channel Pacing Threshold Pulse Width: 0.4 ms
Lead Channel Pacing Threshold Pulse Width: 0.4 ms
Lead Channel Pacing Threshold Pulse Width: 1 ms
Lead Channel Sensing Intrinsic Amplitude: 14.625 mV
Lead Channel Sensing Intrinsic Amplitude: 2.5 mV
Lead Channel Setting Pacing Amplitude: 1.5 V
Lead Channel Setting Pacing Amplitude: 2 V
Lead Channel Setting Pacing Amplitude: 2.5 V
Lead Channel Setting Pacing Pulse Width: 0.4 ms
Lead Channel Setting Pacing Pulse Width: 1 ms
Lead Channel Setting Sensing Sensitivity: 0.3 mV

## 2019-01-08 ENCOUNTER — Encounter (HOSPITAL_COMMUNITY): Payer: Self-pay

## 2019-01-08 NOTE — Progress Notes (Signed)
EPIC Encounter for ICM Monitoring  Patient Name: Justin [name] is a 85 y.o. male Date: 01/08/2019 Primary Care Physican: Laurey Morale, MD Primary Cardiologist:[place] Electrophysiologist:Klein Bi-V Pacing:90.7% LastWeight:258.2 lbs (baseline 255 lb - 258 lb)  Transmission reviewed and results sent via my chart.  Report: Thoracic impedancenormal but was abnormal suggesting fluid accumulation from 11/25/2018 - 12/16/2018.  Prescribed: Furosemide80 mg take 1 tablets (80 mg total) by mouth once daily every other day, alternating with twice daily on alternate days  Recommendations: Recommendation to limit salt and fluid intake  Follow-up plan: ICM clinic phone appointment on6/15/2020  Copy of ICM check sent to [facility].   3 month ICM trend: 01/04/2019    1 Year ICM trend:        [name], RN 01/08/2019 11:21 AM

## 2019-01-11 ENCOUNTER — Encounter (HOSPITAL_COMMUNITY): Payer: Self-pay

## 2019-01-11 DIAGNOSIS — H53411 Scotoma involving central area, right eye: Secondary | ICD-10-CM | POA: Diagnosis not present

## 2019-01-11 DIAGNOSIS — H35363 Drusen (degenerative) of macula, bilateral: Secondary | ICD-10-CM | POA: Diagnosis not present

## 2019-01-11 DIAGNOSIS — H35453 Secondary pigmentary degeneration, bilateral: Secondary | ICD-10-CM | POA: Diagnosis not present

## 2019-01-11 DIAGNOSIS — H353221 Exudative age-related macular degeneration, left eye, with active choroidal neovascularization: Secondary | ICD-10-CM | POA: Diagnosis not present

## 2019-01-11 DIAGNOSIS — H353213 Exudative age-related macular degeneration, right eye, with inactive scar: Secondary | ICD-10-CM | POA: Diagnosis not present

## 2019-01-13 ENCOUNTER — Encounter (HOSPITAL_COMMUNITY): Payer: Self-pay

## 2019-01-15 ENCOUNTER — Ambulatory Visit (INDEPENDENT_AMBULATORY_CARE_PROVIDER_SITE_OTHER): Payer: Medicare Other | Admitting: *Deleted

## 2019-01-15 ENCOUNTER — Encounter (HOSPITAL_COMMUNITY): Payer: Self-pay

## 2019-01-15 ENCOUNTER — Other Ambulatory Visit: Payer: Self-pay

## 2019-01-15 DIAGNOSIS — J309 Allergic rhinitis, unspecified: Secondary | ICD-10-CM

## 2019-01-15 NOTE — Progress Notes (Signed)
Immunotherapy   Patient Details  Name: Justin [name] MRN: [id number] Date of Birth: [birth year]  01/15/2019  Justin [name] started injections for  MOLD-CR, POLLEN-MITE Following schedule: B  Frequency:1 time per week Epi-Pen: Yes Consent signed and patient instructions given. Patient received .5ml of MOLD-CR in RUA and .20ml of POLLEN-MITE. Patient waited 30 minutes in office and did not experience any issues.    Drayke Grabel Fernandez-Vernon 01/15/2019, 10:36 AM

## 2019-01-20 ENCOUNTER — Encounter (HOSPITAL_COMMUNITY): Payer: Self-pay

## 2019-01-22 ENCOUNTER — Ambulatory Visit (INDEPENDENT_AMBULATORY_CARE_PROVIDER_SITE_OTHER): Payer: Medicare Other | Admitting: *Deleted

## 2019-01-22 ENCOUNTER — Encounter (HOSPITAL_COMMUNITY): Payer: Self-pay

## 2019-01-22 DIAGNOSIS — J309 Allergic rhinitis, unspecified: Secondary | ICD-10-CM | POA: Diagnosis not present

## 2019-01-22 DIAGNOSIS — H353221 Exudative age-related macular degeneration, left eye, with active choroidal neovascularization: Secondary | ICD-10-CM | POA: Diagnosis not present

## 2019-01-25 ENCOUNTER — Encounter (HOSPITAL_COMMUNITY): Payer: Self-pay

## 2019-01-25 NOTE — Progress Notes (Signed)
Remote ICD transmission.   

## 2019-01-26 ENCOUNTER — Telehealth: Payer: Self-pay | Admitting: *Deleted

## 2019-01-26 NOTE — Telephone Encounter (Signed)
Pt reports Pradaxa is very expensive and is asking if there is any way to get it cheaper.  He cannot take Xarelto and Eliquis due to side effects. Will forward to Lyn Via to see if he may qualify for pt assistance.

## 2019-01-26 NOTE — Telephone Encounter (Signed)
Phone visit-June 15 Consent obtained on June 2 Pt has scales but no BP cuff or pulse ox.   Virtual Visit Pre-Appointment Phone Call  "(Name), I am calling you today to discuss your upcoming appointment. We are currently trying to limit exposure to the virus that causes COVID-19 by seeing patients at home rather than in the office."  1. "What is the BEST phone number to call the day of the visit?" - include this in appointment notes -[phone]  2. "Do you have or have access to (through a family member/friend) a smartphone with video capability that we can use for your visit?"no a. If yes - list this number in appt notes as "cell" (if different from BEST phone #) and list the appointment type as a VIDEO visit in appointment notes b. If no - list the appointment type as a PHONE visit in appointment notes  3. Confirm consent - "In the setting of the current Covid19 crisis, you are scheduled for a phone visit with your provider on June 15,2020  Just as we do with many in-office visits, in order for you to participate in this visit, we must obtain consent.  If you'd like, I can send this to your mychart (if signed up) or email for you to review.  Otherwise, I can obtain your verbal consent now.  All virtual visits are billed to your insurance company just like a normal visit would be.  By agreeing to a virtual visit, we'd like you to understand that the technology does not allow for your provider to perform an examination, and thus may limit your provider's ability to fully assess your condition. If your provider identifies any concerns that need to be evaluated in person, we will make arrangements to do so.  Finally, though the technology is pretty good, we cannot assure that it will always work on either your or our end, and in the setting of a video visit, we may have to convert it to a phone-only visit.  In either situation, we cannot ensure that we have a secure connection.  Are you willing to  proceed?" STAFF: Did the patient verbally acknowledge consent to telehealth visit? Document YES/NO here: yes  4. Advise patient to be prepared - "Two hours prior to your appointment, go ahead and check your blood pressure, pulse, oxygen saturation, and your weight (if you have the equipment to check those) and write them all down. When your visit starts, your provider will ask you for this information. If you have an Apple Watch or Kardia device, please plan to have heart rate information ready on the day of your appointment. Please have a pen and paper handy nearby the day of the visit as well."  5. Give patient instructions for MyChart download to smartphone OR Doximity/Doxy.me as below if video visit (depending on what platform provider is using)  6. Inform patient they will receive a phone call 15 minutes prior to their appointment time (may be from unknown caller ID) so they should be prepared to answer    TELEPHONE CALL NOTE  Justin [name] has been deemed a candidate for a follow-up tele-health visit to limit community exposure during the Covid-19 pandemic. I spoke with the patient via phone to ensure availability of phone/video source, confirm preferred email & phone number, and discuss instructions and expectations.  I reminded Justin [name] to be prepared with any vital sign and/or heart rhythm information that could potentially be obtained via home monitoring, at the  time of his visit. I reminded Justin [name] to expect a phone call prior to his visit.  [name], RN 01/26/2019 12:38 PM   INSTRUCTIONS FOR DOWNLOADING THE MYCHART APP TO SMARTPHONE  - The patient must first make sure to have activated MyChart and know their login information - If Apple, go to [employer] and type in MyChart in the search bar and download the app. If Android, ask patient to go to [employer] and type in [place] in the search bar and download the app. The app is free but as with any  other app downloads, their phone may require them to verify saved payment information or Apple/Android password.  - The patient will need to then log into the app with their MyChart username and password, and select [facility] as their healthcare provider to link the account. When it is time for your visit, go to the MyChart app, find appointments, and click Begin Video Visit. Be sure to Select Allow for your device to access the Microphone and Camera for your visit. You will then be connected, and your provider will be with you shortly.  **If they have any issues connecting, or need assistance please contact MyChart service desk [phone])**  **If using a computer, in order to ensure the best quality for their visit they will need to use either of the following Internet Browsers: [employer], or Google Chrome**  IF USING DOXIMITY or DOXY.ME - The patient will receive a link just prior to their visit by text.     FULL LENGTH CONSENT FOR TELE-HEALTH VISIT   I hereby voluntarily request, consent and authorize [facility] and its employed or contracted physicians, physician assistants, nurse practitioners or other licensed health care professionals (the Practitioner), to provide me with telemedicine health care services (the "Services") as deemed necessary by the treating Practitioner. I acknowledge and consent to receive the Services by the Practitioner via telemedicine. I understand that the telemedicine visit will involve communicating with the Practitioner through live audiovisual communication technology and the disclosure of certain medical information by electronic transmission. I acknowledge that I have been given the opportunity to request an in-person assessment or other available alternative prior to the telemedicine visit and am voluntarily participating in the telemedicine visit.  I understand that I have the right to withhold or withdraw my consent to the use of  telemedicine in the course of my care at any time, without affecting my right to future care or treatment, and that the Practitioner or I may terminate the telemedicine visit at any time. I understand that I have the right to inspect all information obtained and/or recorded in the course of the telemedicine visit and may receive copies of available information for a reasonable fee.  I understand that some of the potential risks of receiving the Services via telemedicine include:  [name] Delay or interruption in medical evaluation due to technological equipment failure or disruption; . Information transmitted may not be sufficient (e.g. poor resolution of images) to allow for appropriate medical decision making by the Practitioner; and/or  . In rare instances, security protocols could fail, causing a breach of personal health information.  Furthermore, I acknowledge that it is my responsibility to provide information about my medical history, conditions and care that is complete and accurate to the best of my ability. I acknowledge that Practitioner's advice, recommendations, and/or decision may be based on factors not within their control, such as incomplete or inaccurate data provided  by me or distortions of diagnostic images or specimens that may result from electronic transmissions. I understand that the practice of medicine is not an exact science and that Practitioner makes no warranties or guarantees regarding treatment outcomes. I acknowledge that I will receive a copy of this consent concurrently upon execution via email to the email address I last provided but may also request a printed copy by calling the office of [facility].    I understand that my insurance will be billed for this visit.   I have read or had this consent read to me. . I understand the contents of this consent, which adequately explains the benefits and risks of the Services being provided via telemedicine.  . I have been  provided ample opportunity to ask questions regarding this consent and the Services and have had my questions answered to my satisfaction. . I give my informed consent for the services to be provided through the use of telemedicine in my medical care  By participating in this telemedicine visit I agree to the above.

## 2019-01-27 ENCOUNTER — Encounter (HOSPITAL_COMMUNITY): Payer: Self-pay

## 2019-01-27 NOTE — Telephone Encounter (Signed)
I called [employer] and s/w Abigail Butts concerning he cost of Pradaxa for the pt. Per Abigail Butts a PA is not required and that the pt paid $350 for a 90 day supply in January, 2020 and that he paid $300 for a 90 day supply on 4//20/2020 so his cost has gone down but not by much.  I am going to attempt a Tier exception in hopes of getting the cost down for the pt.

## 2019-01-27 NOTE — Telephone Encounter (Signed)
**Note De-Identified Eddy Termine Obfuscation** I called [facility] and did a Pradaxa tier exception over the phone with Lewis. Per Lewis they will fax [place] the determination within 24 to 48 hours.

## 2019-01-28 ENCOUNTER — Other Ambulatory Visit: Payer: Self-pay | Admitting: Family Medicine

## 2019-01-28 NOTE — Telephone Encounter (Signed)
I called the pt and advised him that we received a letter from [facility] stating that they have denied his Pradaxa tier exception. Reason: Pradaxa is not on the pt's plan's list of covered drugs. They state that they have already granted coverage of Pradaxa even though it is not on the plan's list and cannot lower the cost to the pt any further.  I gave the pt the phone number to reach [facility] Patient Assistance Program at [phone]. I have asked him to call me back with an update and he states that he will.

## 2019-01-29 ENCOUNTER — Ambulatory Visit (INDEPENDENT_AMBULATORY_CARE_PROVIDER_SITE_OTHER): Payer: Medicare Other | Admitting: *Deleted

## 2019-01-29 ENCOUNTER — Encounter (HOSPITAL_COMMUNITY): Payer: Self-pay

## 2019-01-29 DIAGNOSIS — J309 Allergic rhinitis, unspecified: Secondary | ICD-10-CM

## 2019-02-01 ENCOUNTER — Encounter (HOSPITAL_COMMUNITY): Payer: Self-pay

## 2019-02-03 ENCOUNTER — Encounter (HOSPITAL_COMMUNITY): Payer: Self-pay

## 2019-02-05 ENCOUNTER — Ambulatory Visit (INDEPENDENT_AMBULATORY_CARE_PROVIDER_SITE_OTHER): Payer: Medicare Other | Admitting: *Deleted

## 2019-02-05 ENCOUNTER — Encounter (HOSPITAL_COMMUNITY): Payer: Self-pay

## 2019-02-05 DIAGNOSIS — J309 Allergic rhinitis, unspecified: Secondary | ICD-10-CM | POA: Diagnosis not present

## 2019-02-08 ENCOUNTER — Encounter: Payer: Self-pay | Admitting: Cardiovascular Disease

## 2019-02-08 ENCOUNTER — Ambulatory Visit (INDEPENDENT_AMBULATORY_CARE_PROVIDER_SITE_OTHER): Payer: Medicare Other

## 2019-02-08 ENCOUNTER — Telehealth (INDEPENDENT_AMBULATORY_CARE_PROVIDER_SITE_OTHER): Payer: Medicare Other | Admitting: Cardiovascular Disease

## 2019-02-08 ENCOUNTER — Other Ambulatory Visit: Payer: Self-pay

## 2019-02-08 ENCOUNTER — Encounter (HOSPITAL_COMMUNITY): Payer: Self-pay

## 2019-02-08 VITALS — BP 146/72 | Ht 67.0 in | Wt 252.7 lb

## 2019-02-08 DIAGNOSIS — I480 Paroxysmal atrial fibrillation: Secondary | ICD-10-CM | POA: Diagnosis not present

## 2019-02-08 DIAGNOSIS — Z95810 Presence of automatic (implantable) cardiac defibrillator: Secondary | ICD-10-CM | POA: Diagnosis not present

## 2019-02-08 DIAGNOSIS — I5020 Unspecified systolic (congestive) heart failure: Secondary | ICD-10-CM | POA: Diagnosis not present

## 2019-02-08 DIAGNOSIS — I255 Ischemic cardiomyopathy: Secondary | ICD-10-CM

## 2019-02-08 DIAGNOSIS — I25118 Atherosclerotic heart disease of native coronary artery with other forms of angina pectoris: Secondary | ICD-10-CM | POA: Diagnosis not present

## 2019-02-08 DIAGNOSIS — I350 Nonrheumatic aortic (valve) stenosis: Secondary | ICD-10-CM | POA: Diagnosis not present

## 2019-02-08 DIAGNOSIS — I10 Essential (primary) hypertension: Secondary | ICD-10-CM

## 2019-02-08 NOTE — Progress Notes (Signed)
Virtual Visit via Video Note   This visit type was conducted due to national recommendations for restrictions regarding the COVID-19 Pandemic (e.g. social distancing) in an effort to limit this patient's exposure and mitigate transmission in our community.  Due to his co-morbid illnesses, this patient is at least at moderate risk for complications without adequate follow up.  This format is felt to be most appropriate for this patient at this time.  All issues noted in this document were discussed and addressed.  A limited physical exam was performed with this format.  Please refer to the patient's chart for his consent to telehealth for [facility].   Date:  02/08/2019   ID:  Justin [name], Justin [name] 1933/12/27, MRN [id number]  Patient Location: Home Provider Location: Office  PCP:  Laurey Morale, MD  Cardiologist:  [name], MD  Electrophysiologist:  None   Evaluation Performed:  Follow-Up Visit  Chief Complaint:  Follow up- CAD  History of Present Illness:    Justin [name] is a 85 y.o. male with history of HTN, HLD, severe aortic stenosis s/p TAVR, PAF, chronic diastolic CHF and CAD s/p 4V CABG, syncope, junctional bradycardia, PVCS, non-sustained VT and LBBB s/p ICD implantation who is who is being seen today by virtual e-visit due to the [place] pandemic. He is known to have CAD s/p 4V CABG in March 2007 (LIMA to LAD, SVG to Diagonal, SVG to OM, SVG to RCA). Last cardiac cath in January 2018 with 4/4 patent bypass grafts. He had progression of his aortic stenosis with worsened dyspnea and acute on chronic CHF in the spring of 2018. Echo May 2018 with mildly reduced LV systolic function, HKVQ=25-95%. His aortic stenosis was severe. He underwent TAVR on 03/04/17 with a 29 mm Edwards Sapien 3 valve from the right transfemoral approach.  He developed atrial fibrillation following his procedure and was placed on IV amiodarone with conversion to sinus rhythm. He has been on  ASA and Eliquis (Xarelto had been used initially but pt thought it caused him to have shoulder pain). Follow up visit in our office 03/19/17 and pt noted to have irregular rhythm with intraventricular conduction delay and was felt by our EP team to represent 2:1 conduction of competing fast and slow pathways. Cardiac monitor with sinus rhythm with periods of junctional rhythm, frequent PVCs, frequent PACs and runs of a wide complex tachycardia. ICD was implanted on 05/03/17. Echo June 2019 with with LVEF=40-45%.   The patient denies chest pain, dyspnea, palpitations, dizziness, near syncope or syncope. No lower extremity edema. He is feeling well. He is very active. Having issues with macular degeneration.   The patient does not have symptoms concerning for COVID-19 infection (fever, chills, cough, or new shortness of breath).   Past Medical History:  Diagnosis Date   Age-related macular degeneration, dry, both eyes    Allergic    "24/7; 365 days/year; I'm allergic to pollens, dust, all southern grasses/trees, mold, mildue, cats, dogs" (01/07/2017)   Anal fissure    Asthma    sees [name]    Benign prostatic hypertrophy    (sees Dr. Denman George   CAD (coronary artery disease)    a. s/p CABG 2007. b. Cath 08/2016 - 4/4 patent grafts.   Carotid bruit    carotid u/s 10/10: 0.39% bilaterally   Chronic combined systolic and diastolic CHF (congestive heart failure) ([place])    Complication of anesthesia 1980s   "w/anal cyst OR, he gave  me a saddle block then put a narcotic in spinal cord; had a severe reaction to that" (01/07/2017)   Congestive heart failure (CHF) [facility])    ED (erectile dysfunction)    Family history of adverse reaction to anesthesia    "daughter wakes up during OR" (01/07/2017)   GERD (gastroesophageal reflux disease)    Gout    HTN (hypertension)    Hx of colonic polyps    (sees Dr. Henrene Pastor)   Hyperlipidemia    Hypothyroidism    Moderate to severe aortic  stenosis    a. s/p TAVR 02/2017.   Myocardial infarction [facility]) ~ 2000   Obesity    Osteoarthritis    "was in my knees, hands" (01/07/2017 )   PAF (paroxysmal atrial fibrillation) ([place])    a. documented post TAVR.   Precancerous skin lesion    (sees Dr. Allyson Sabal)   Prostate cancer [facility]) dx'd ~ 2014   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   29 mm Edwards Sapien 3 transcatheter heart valve placed via percutaneous right transfemoral approach   Past Surgical History:  Procedure Laterality Date   ANUS SURGERY     "opened it back up cause it wouldn't heal; wound up w/a fissure" (01/07/2017)   BIV ICD INSERTION CRT-D N/A 05/02/2017   Procedure: BIV ICD INSERTION CRT-D;  Surgeon: Deboraha Sprang, MD;  Location: [facility];  Service: Cardiovascular;  Laterality: N/A;   CARDIAC CATHETERIZATION  10/29/2005   CARDIAC CATHETERIZATION N/A 08/27/2016   Procedure: Right/Left Heart Cath and Coronary/Graft Angiography;  Surgeon: Burnell Blanks, MD;  Location: [facility];  Service: Cardiovascular;  Laterality: N/A;   CATARACT EXTRACTION W/ INTRAOCULAR LENS  IMPLANT, BILATERAL Bilateral    CATARACT EXTRACTION, BILATERAL  2012   COLONOSCOPY  06/30/2008   no repeats needed    COLONOSCOPY     had 3 or 4 in the past    CORONARY ARTERY BYPASS GRAFT  2007   "CABG X4"   CYST EXCISION PERINEAL  1980s   HAMMER TOE SURGERY Bilateral    JOINT REPLACEMENT     KNEE ARTHROPLASTY  07/30/2011   Procedure: COMPUTER ASSISTED TOTAL KNEE ARTHROPLASTY;  Surgeon: Meredith Pel;  Location: [facility];  Service: Orthopedics;  Laterality: Left;  left total knee arthroplasty   MASTECTOMY SUBCUTANEOUS Bilateral    ORIF FINGER / THUMB FRACTURE Right ~ 1980   "repair of thumb injury"   PROSTATE BIOPSY     REPLACEMENT TOTAL KNEE BILATERAL Bilateral 2012   TEE WITHOUT CARDIOVERSION N/A 03/04/2017   Procedure: TRANSESOPHAGEAL ECHOCARDIOGRAM  (TEE);  Surgeon: Burnell Blanks, MD;  Location: [place] [facility];  Service: Open Heart Surgery;  Laterality: N/A;   TRANSCATHETER AORTIC VALVE REPLACEMENT, TRANSFEMORAL N/A 03/04/2017   Procedure: TRANSCATHETER AORTIC VALVE REPLACEMENT, TRANSFEMORAL;  Surgeon: Burnell Blanks, MD;  Location: [facility];  Service: Open Heart Surgery;  Laterality: N/A;     Current Meds  Medication Sig   aspirin 81 MG tablet Take 1 tablet (81 mg total) by mouth daily.   atorvastatin (LIPITOR) 20 MG tablet Take 20 mg by mouth daily.   benzonatate (TESSALON) 200 MG capsule Take 200 mg by mouth 3 (three) times daily as needed for cough.   cetirizine (ZYRTEC) 10 MG tablet Take 10 mg by mouth daily.   chlorpheniramine (CHLOR-TRIMETON) 4 MG tablet Take 8 mg by mouth at bedtime.   dabigatran (PRADAXA) 150 MG CAPS capsule Take 1  capsule (150 mg total) by mouth 2 (two) times daily.   EPINEPHrine 0.3 mg/0.3 mL IJ SOAJ injection Inject 0.3 mLs (0.3 mg total) into the muscle as needed for anaphylaxis.   famotidine (PEPCID) 10 MG tablet Take 10 mg by mouth at bedtime.   furosemide (LASIX) 80 MG tablet Take 80 mg by mouth 2 (two) times daily. TAKE ONE TABLET ONCE A DAY ALTERNATING WITH ONE TABLET TWICE A DAY.   HYDROcodone-homatropine (HYCODAN) 5-1.5 MG/5ML syrup Take 5 mLs by mouth at bedtime and may repeat dose one time if needed.   ipratropium (ATROVENT) 0.03 % nasal spray Place 2 sprays into both nostrils every 6 (six) hours as needed for rhinitis.   isosorbide mononitrate (IMDUR) 30 MG 24 hr tablet TAKE 1 TABLET ONCE DAILY.   Ketotifen Fumarate (ZADITOR OP) Apply 1 drop to eye daily as needed (itchy eyes).    levothyroxine (SYNTHROID, LEVOTHROID) 75 MCG tablet Take 1 tablet (75 mcg total) by mouth daily.   losartan (COZAAR) 25 MG tablet TAKE 1 TABLET EACH DAY.   metoprolol tartrate (LOPRESSOR) 25 MG tablet Take 25 mg by mouth 2 (two) times daily.   montelukast (SINGULAIR) 10 MG tablet Take 1 tablet (10  mg total) by mouth every evening.   Multiple Vitamins-Minerals (PRESERVISION AREDS PO) Take 2 tablets by mouth every morning.   omeprazole (PRILOSEC) 20 MG capsule Take 1 capsule (20 mg total) by mouth 2 (two) times daily.   potassium chloride SA (K-DUR,KLOR-CON) 20 MEQ tablet Take 2 tablets by mouth twice daily alternating with 2 tablets daily.   triamcinolone cream (KENALOG) 0.1 % Apply 1 application topically daily as needed for dry skin.   valACYclovir (VALTREX) 500 MG tablet Take 500 mg by mouth 2 (two) times daily as needed (cold sores).     Allergies:   Peanut-containing drug products, Sulfonamide derivatives, Amlodipine, Eliquis [apixaban], Lisinopril, and Xarelto [rivaroxaban]   Social History   Tobacco Use   Smoking status: Former Smoker    Packs/day: 3.50    Years: 13.00    Pack years: 45.50    Types: Cigarettes    Quit date: 1963    Years since quitting: 57.4   Smokeless tobacco: Never Used  Substance Use Topics   Alcohol use: Yes    Alcohol/week: 0.0 standard drinks    Comment: 01/07/2017 "might have 2 beers 2-3 times/month"   Drug use: No     Family Hx: The patient's family history includes Allergic rhinitis in his father; Asthma in his father; Breast cancer in his mother; Heart attack (age of onset: 4) in his father; Heart failure (age of onset: 84) in his mother; Uterine cancer in his mother. There is no history of Colon cancer or Esophageal cancer.  ROS:   Please see the history of present illness.    All other systems reviewed and are negative.   Prior CV studies:   The following studies were reviewed today:  Echo June 2019: .- Left ventricle: The cavity size was normal. Wall thickness was increased in a pattern of mild LVH. Systolic function was mildly to moderately reduced. The estimated ejection fraction was in the range of 40% to 45%. Diffuse hypokinesis. Features are consistent with a pseudonormal left ventricular filling pattern,  with concomitant abnormal relaxation and increased filling pressure (grade 2 diastolic dysfunction). - Aortic valve: Bioprosthetic aortic valve s/p TAVR. There was no stenosis. There was no regurgitation. Mean gradient (S): 9 mm Hg. Valve area (VTI): 1.99 cm^2. - Aorta: Ascending aortic  diameter: 39 mm (S). - Ascending aorta: The ascending aorta was mildly dilated. - Mitral valve: Mildly calcified annulus. There was no significant regurgitation. - Left atrium: The atrium was moderately dilated. - Right ventricle: The cavity size was normal. Pacer wire or catheter noted in right ventricle. Systolic function was mildly reduced. - Right atrium: The atrium was mildly dilated. - Tricuspid valve: Peak RV-RA gradient (S): 19 mm Hg. - Inferior vena cava: The vessel was normal in size. The respirophasic diameter changes were in the normal range (>= 50%), consistent with normal central venous pressure.  Impressions:  - Poor images, Definity contrast may have helped but was not used. Normal LV size with mild LV hypertrophy. EF 40-45%, diffuse hypokinesis. Normal RV size with mildly decreased systolic function. Bioprosthetic aortic valve s/p TAVR appeared to function normally.    Cardiac cath 08/27/16: Diagnostic Diagram          Labs/Other Tests and Data Reviewed:    EKG:  No ECG reviewed.  Recent Labs: 08/11/2018: ALT 15   Recent Lipid Panel Lab Results  Component Value Date/Time   CHOL 171 08/11/2018 07:35 AM   TRIG 321 (H) 08/11/2018 07:35 AM   TRIG 143 06/30/2006 11:32 AM   HDL 34 (L) 08/11/2018 07:35 AM   CHOLHDL 5.0 08/11/2018 07:35 AM   CHOLHDL 4 09/25/2016 08:28 AM   LDLCALC 73 08/11/2018 07:35 AM    Wt Readings from Last 3 Encounters:  02/08/19 252 lb 11.2 oz (114.6 kg)  11/09/18 264 lb (119.7 kg)  09/30/18 264 lb (119.7 kg)     Objective:    Vital Signs:  BP (!) 146/72    Ht 5\' 7"  (1.702 m)    Wt 252 lb 11.2 oz (114.6 kg)     BMI 39.58 kg/m    VITAL SIGNS:  reviewed GEN:  no acute distress  ASSESSMENT & PLAN:    1. CAD s/p CABG with angina: No chest pain. Continue ASA, statin, beta blocker and Imdur.     2. Aortic valve stenosis: He is s/p TAVR in July 2018. NYHA class 2. Will continue SBE prophylaxis as indicated. He is on Pradaxa.   3. HTN: BP is well controlled. No changes   4. HLD: LDL at goal in December 2019. Continue statin.   5. Chronic systolic and diastolic CHF: Weight is stable. Continue Lasix  6. Atrial fibrillation, paroxysmal: He had post-operative atrial fibrillation following TAVR. ICD in place. Followed by Dr. [name]. Continue Lopressor and Pradaxa.    7. Ischemic cardiomyopathy: ICD in place.   8. Junctional bradycardia: ICD in place.  COVID-19 Education: The signs and symptoms of COVID-19 were discussed with the patient and how to seek care for testing (follow up with PCP or arrange E-visit).  The importance of social distancing was discussed today.  Time:   Today, I have spent 15 minutes with the patient with telehealth technology discussing the above problems.     Medication Adjustments/Labs and Tests Ordered: Current medicines are reviewed at length with the patient today.  Concerns regarding medicines are outlined above.   Tests Ordered: No orders of the defined types were placed in this encounter.   Medication Changes: No orders of the defined types were placed in this encounter.   Disposition:  Follow up in 6 month(s)  Signed, [name], MD  02/08/2019 8:39 AM    [facility]

## 2019-02-08 NOTE — Patient Instructions (Signed)

## 2019-02-08 NOTE — Progress Notes (Signed)
EPIC Encounter for ICM Monitoring  Patient Name: IZEAH VOSSLER is a 85 y.o. male Date: 02/08/2019 Primary Care Physican: Laurey Morale, MD Primary Cardiologist:[place] Electrophysiologist:[place] Bi-V Pacing:93% LastWeight:258.2lbs (baseline 255 lb - 258 lb)  Transmission reviewed.  Optivol thoracic impedancenormal.  Prescribed: Furosemide80 mgtake TAKE ONE TABLET ONCE A DAY ALTERNATING WITH ONE TABLET TWICE A DAY.  Recommendations:None  Follow-up plan: ICM clinic phone appointment on7/20/2020.  Copy of ICM check sent to [place].  .  3 month ICM trend: 02/08/2019    1 Year ICM trend:       [name], RN 02/08/2019 8:04 AM

## 2019-02-10 ENCOUNTER — Encounter (HOSPITAL_COMMUNITY): Payer: Self-pay

## 2019-02-12 ENCOUNTER — Ambulatory Visit (INDEPENDENT_AMBULATORY_CARE_PROVIDER_SITE_OTHER): Payer: Medicare Other | Admitting: *Deleted

## 2019-02-12 ENCOUNTER — Encounter (HOSPITAL_COMMUNITY): Payer: Self-pay

## 2019-02-12 DIAGNOSIS — J309 Allergic rhinitis, unspecified: Secondary | ICD-10-CM

## 2019-02-15 ENCOUNTER — Encounter (HOSPITAL_COMMUNITY): Payer: Self-pay

## 2019-02-17 ENCOUNTER — Encounter (HOSPITAL_COMMUNITY): Payer: Self-pay

## 2019-02-18 ENCOUNTER — Telehealth (HOSPITAL_COMMUNITY): Payer: Self-pay | Admitting: *Deleted

## 2019-02-18 NOTE — Telephone Encounter (Signed)
Called patient re: the maintenance Cardiac Rehab exercise classes.  They remain on hold at this time due to the COVID-19 pandemic precautions.

## 2019-02-19 ENCOUNTER — Ambulatory Visit (INDEPENDENT_AMBULATORY_CARE_PROVIDER_SITE_OTHER): Payer: Medicare Other | Admitting: *Deleted

## 2019-02-19 ENCOUNTER — Encounter (HOSPITAL_COMMUNITY): Payer: Self-pay

## 2019-02-19 DIAGNOSIS — J309 Allergic rhinitis, unspecified: Secondary | ICD-10-CM

## 2019-02-22 ENCOUNTER — Encounter (HOSPITAL_COMMUNITY): Payer: Self-pay

## 2019-02-23 DIAGNOSIS — H353221 Exudative age-related macular degeneration, left eye, with active choroidal neovascularization: Secondary | ICD-10-CM | POA: Diagnosis not present

## 2019-02-24 ENCOUNTER — Encounter (HOSPITAL_COMMUNITY): Payer: Self-pay

## 2019-03-01 ENCOUNTER — Encounter (HOSPITAL_COMMUNITY): Payer: Self-pay

## 2019-03-01 ENCOUNTER — Ambulatory Visit (INDEPENDENT_AMBULATORY_CARE_PROVIDER_SITE_OTHER): Payer: Medicare Other

## 2019-03-01 DIAGNOSIS — J309 Allergic rhinitis, unspecified: Secondary | ICD-10-CM | POA: Diagnosis not present

## 2019-03-02 IMAGING — NM NM BONE 3 PHASE
10 series · 20 of 20 positions shown · non-contrast
Comparison: None

CLINICAL DATA: Chronic RIGHT knee pain, known osteomyelitis, prior
BILATERAL knee replacement surgery in 5725, RIGHT knee pain for
years

EXAM:
NUCLEAR MEDICINE 3-PHASE BONE SCAN
TECHNIQUE: Radionuclide angiographic images, immediate static blood pool
images, and 3-hour delayed static images were obtained of the knees
after intravenous injection of radiopharmaceutical.
RADIOPHARMACEUTICALS:  20.5 mCi 4c-LLm MDP IV

[Series 1: flow · 2.07mm/px · 6 of 48 frames shown (1 of 2)]
[frame 5/48]
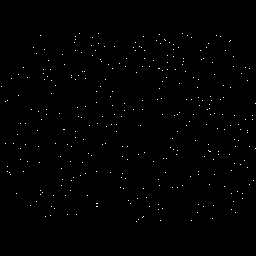
[frame 13/48]
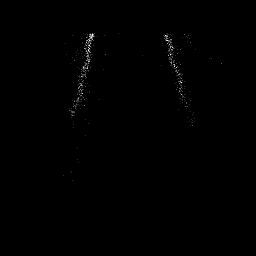
[frame 21/48  full-range]
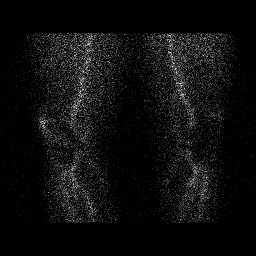
[frame 29/48  full-range]
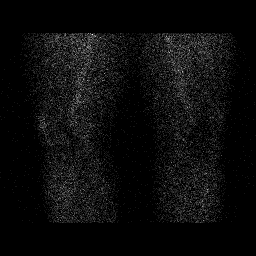
[frame 37/48  full-range]
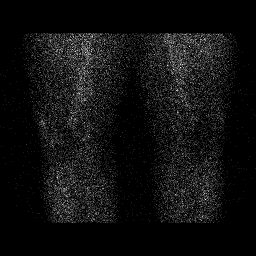
[frame 45/48  full-range]
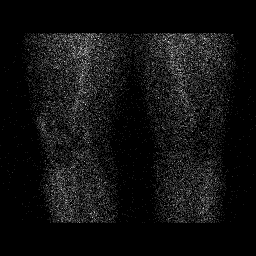

[Series 1: flow · 2.07mm/px · 6 of 48 frames shown (2 of 2)]
[frame 5/48]
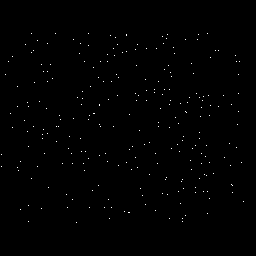
[frame 13/48]
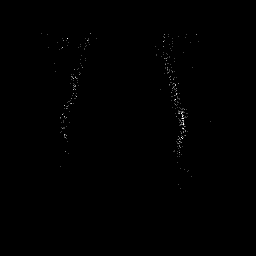
[frame 21/48  full-range]
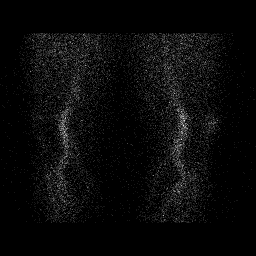
[frame 29/48  full-range]
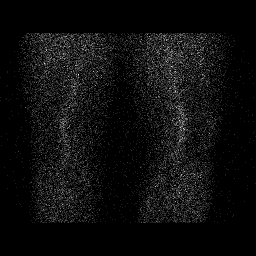
[frame 37/48  full-range]
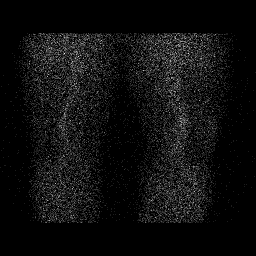
[frame 45/48  full-range]
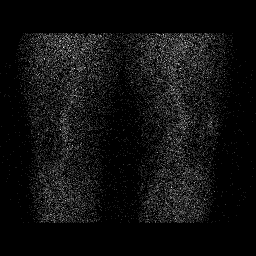

[Series 1: lat bp · 2.07mm/px · 1 of 1 slices shown (1 of 2)]
[im 1/1  full-range]
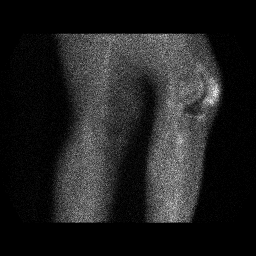

[Series 1: lat bp · 2.07mm/px · 1 of 1 slices shown (2 of 2)]
[im 1/1  full-range]
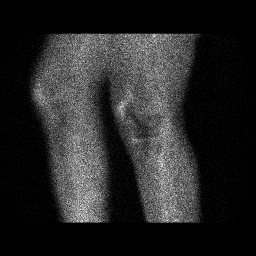

[Series 2: blood pool · 2.07mm/px · 1 of 1 slices shown (1 of 2)]
[im 1/1  full-range]
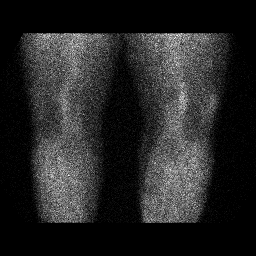

[Series 2: blood pool · 2.07mm/px · 1 of 1 slices shown (2 of 2)]
[im 1/1  full-range]
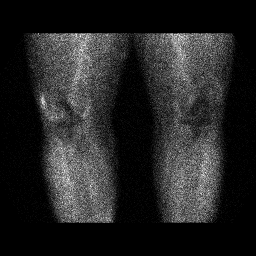

[Series 3: delay · delayed · 2.07mm/px · 1 of 1 slices shown (1 of 4)]
[im 1/1]
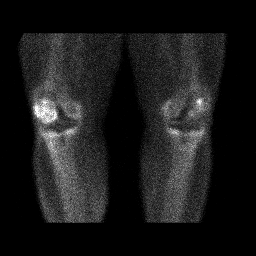

[Series 3: delay · delayed · 2.07mm/px · 1 of 1 slices shown (2 of 4)]
[im 1/1  full-range]
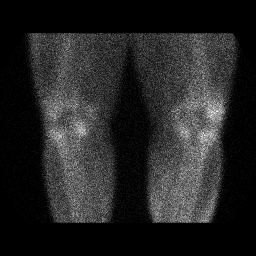

[Series 4: delay · delayed · 2.07mm/px · 1 of 1 slices shown (3 of 4)]
[im 1/1]
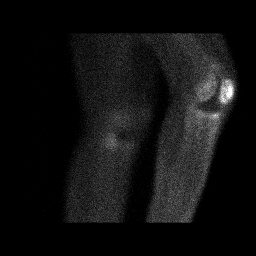

[Series 4: delay · delayed · 2.07mm/px · 1 of 1 slices shown (4 of 4)]
[im 1/1]
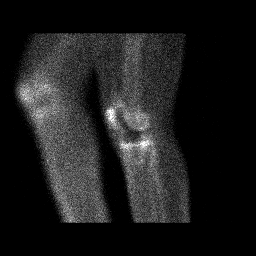

[20 of 20 positions shown; findings below may reference images not displayed]

Radiographic correlation: RIGHT knee radiographs 08/28/2018,
BILATERAL knee radiographs 03/20/2017
FINDINGS: Vascular phase: Minimally increased blood flow to the lateral aspect
of the RIGHT knee versus LEFT. Normal blood flow to LEFT knee.

Blood pool phase: Minimally increased blood pool at the lateral
aspect of the RIGHT patella. Normal blood pool at the LEFT knee

Delayed phase: Photopenic defects at both knees from knee
prostheses. Mildly increased tracer accumulation at the RIGHT
patella, nonspecific. No additional/significant abnormal osseous
tracer accumulation is seen adjacent to components of the knee
prostheses to suggest infection or loosening.
IMPRESSION: BILATERAL knee prostheses.

Nonspecific increased tracer localization at the RIGHT patella
associated with minimally increased blood flow and blood pool to the
lateral aspect of the RIGHT patella.

Otherwise unremarkable exam.

## 2019-03-03 ENCOUNTER — Encounter (HOSPITAL_COMMUNITY): Payer: Self-pay

## 2019-03-05 ENCOUNTER — Encounter (HOSPITAL_COMMUNITY): Payer: Self-pay

## 2019-03-05 ENCOUNTER — Encounter: Payer: Medicare Other | Admitting: Family Medicine

## 2019-03-08 ENCOUNTER — Encounter (HOSPITAL_COMMUNITY): Payer: Self-pay

## 2019-03-10 ENCOUNTER — Encounter (HOSPITAL_COMMUNITY): Payer: Self-pay

## 2019-03-11 ENCOUNTER — Other Ambulatory Visit: Payer: Self-pay

## 2019-03-11 ENCOUNTER — Encounter: Payer: Self-pay | Admitting: Family Medicine

## 2019-03-11 ENCOUNTER — Ambulatory Visit (INDEPENDENT_AMBULATORY_CARE_PROVIDER_SITE_OTHER): Payer: Medicare Other | Admitting: Family Medicine

## 2019-03-11 VITALS — BP 122/74 | HR 77 | Temp 98.0°F | Ht 66.5 in | Wt 258.2 lb

## 2019-03-11 DIAGNOSIS — R739 Hyperglycemia, unspecified: Secondary | ICD-10-CM

## 2019-03-11 DIAGNOSIS — Z0000 Encounter for general adult medical examination without abnormal findings: Secondary | ICD-10-CM

## 2019-03-11 DIAGNOSIS — E039 Hypothyroidism, unspecified: Secondary | ICD-10-CM | POA: Diagnosis not present

## 2019-03-11 LAB — HEPATIC FUNCTION PANEL
ALT: 20 U/L (ref 0–53)
AST: 24 U/L (ref 0–37)
Albumin: 3.7 g/dL (ref 3.5–5.2)
Alkaline Phosphatase: 84 U/L (ref 39–117)
Bilirubin, Direct: 0.2 mg/dL (ref 0.0–0.3)
Total Bilirubin: 0.7 mg/dL (ref 0.2–1.2)
Total Protein: 7 g/dL (ref 6.0–8.3)

## 2019-03-11 LAB — CBC WITH DIFFERENTIAL/PLATELET
Basophils Absolute: 0.1 10*3/uL (ref 0.0–0.1)
Basophils Relative: 0.8 % (ref 0.0–3.0)
Eosinophils Absolute: 0.4 10*3/uL (ref 0.0–0.7)
Eosinophils Relative: 3.4 % (ref 0.0–5.0)
HCT: 42.1 % (ref 39.0–52.0)
Hemoglobin: 13.7 g/dL (ref 13.0–17.0)
Lymphocytes Relative: 23.1 % (ref 12.0–46.0)
Lymphs Abs: 2.5 10*3/uL (ref 0.7–4.0)
MCHC: 32.5 g/dL (ref 30.0–36.0)
MCV: 84.6 fl (ref 78.0–100.0)
Monocytes Absolute: 1.1 10*3/uL — ABNORMAL HIGH (ref 0.1–1.0)
Monocytes Relative: 9.7 % (ref 3.0–12.0)
Neutro Abs: 6.9 10*3/uL (ref 1.4–7.7)
Neutrophils Relative %: 63 % (ref 43.0–77.0)
Platelets: 227 10*3/uL (ref 150.0–400.0)
RBC: 4.98 Mil/uL (ref 4.22–5.81)
RDW: 16.5 % — ABNORMAL HIGH (ref 11.5–15.5)
WBC: 10.9 10*3/uL — ABNORMAL HIGH (ref 4.0–10.5)

## 2019-03-11 LAB — POC URINALSYSI DIPSTICK (AUTOMATED)
Bilirubin, UA: NEGATIVE
Blood, UA: NEGATIVE
Glucose, UA: NEGATIVE
Ketones, UA: NEGATIVE
Leukocytes, UA: NEGATIVE
Nitrite, UA: NEGATIVE
Protein, UA: POSITIVE — AB
Spec Grav, UA: 1.025 (ref 1.010–1.025)
Urobilinogen, UA: 0.2 E.U./dL
pH, UA: 5.5 (ref 5.0–8.0)

## 2019-03-11 LAB — LIPID PANEL
Cholesterol: 115 mg/dL (ref 0–200)
HDL: 31.3 mg/dL — ABNORMAL LOW (ref >39.00)
LDL Cholesterol: 49 mg/dL (ref 0–99)
NonHDL: 83.45
Total CHOL/HDL Ratio: 4
Triglycerides: 170 mg/dL — ABNORMAL HIGH (ref 0.0–149.0)
VLDL: 34 mg/dL (ref 0.0–40.0)

## 2019-03-11 LAB — T4, FREE: Free T4: 0.67 ng/dL (ref 0.60–1.60)

## 2019-03-11 LAB — BASIC METABOLIC PANEL
BUN: 18 mg/dL (ref 6–23)
CO2: 29 mEq/L (ref 19–32)
Calcium: 8.6 mg/dL (ref 8.4–10.5)
Chloride: 102 mEq/L (ref 96–112)
Creatinine, Ser: 0.99 mg/dL (ref 0.40–1.50)
GFR: 72.24 mL/min (ref >60.00)
Glucose, Bld: 99 mg/dL (ref 70–99)
Potassium: 4.1 mEq/L (ref 3.5–5.1)
Sodium: 140 mEq/L (ref 135–145)

## 2019-03-11 LAB — TSH: TSH: 5.28 u[IU]/mL — ABNORMAL HIGH (ref 0.35–4.50)

## 2019-03-11 LAB — T3, FREE: T3, Free: 2.9 pg/mL (ref 2.3–4.2)

## 2019-03-11 LAB — HEMOGLOBIN A1C: Hgb A1c MFr Bld: 6.6 % — ABNORMAL HIGH (ref 4.6–6.5)

## 2019-03-11 LAB — PSA: PSA: 3.35 ng/mL (ref 0.10–4.00)

## 2019-03-11 MED ORDER — POTASSIUM CHLORIDE CRYS ER 20 MEQ PO TBCR: EXTENDED_RELEASE_TABLET | ORAL | 3 refills | Status: DC

## 2019-03-11 MED ORDER — FUROSEMIDE 80 MG PO TABS: 80.0000 mg | ORAL_TABLET | Freq: Two times a day (BID) | ORAL | 3 refills | Status: DC

## 2019-03-11 NOTE — Progress Notes (Signed)
Subjective:    Patient ID: Justin [name], male    DOB: 11-07-1933, 85 y.o.   MRN: [id number]  HPI Here for a well exam. He feels well in general. His arthritis pain flares up some days, but he takes nothing for this. His hands get stiff and painful at times. He was set to have surgery on the right knee in March per Dr. Marlou Sa, but when the virus pandemic hit they decided to postpone this indefinitely. His macular degeneration is slowly progressing, and now he has little use of his right eye. His weight has been stable.    Review of Systems  Constitutional: Negative.   HENT: Negative.   Eyes: Negative.   Respiratory: Negative.   Cardiovascular: Negative.   Gastrointestinal: Negative.   Genitourinary: Negative.   Musculoskeletal: Negative.   Skin: Negative.   Neurological: Negative.   Psychiatric/Behavioral: Negative.        Objective:   Physical Exam Constitutional:      General: He is not in acute distress.    Appearance: He is well-developed. He is not diaphoretic.  HENT:     Head: Normocephalic and atraumatic.     Right Ear: External ear normal.     Left Ear: External ear normal.     Nose: Nose normal.     Mouth/Throat:     Pharynx: No oropharyngeal exudate.  Eyes:     General: No scleral icterus.       Right eye: No discharge.        Left eye: No discharge.     Conjunctiva/sclera: Conjunctivae normal.     Pupils: Pupils are equal, round, and reactive to light.  Neck:     Musculoskeletal: Neck supple.     Thyroid: No thyromegaly.     Vascular: No JVD.     Trachea: No tracheal deviation.  Cardiovascular:     Rate and Rhythm: Normal rate and regular rhythm.     Heart sounds: Normal heart sounds. No murmur. No friction rub. No gallop.   Pulmonary:     Effort: Pulmonary effort is normal. No respiratory distress.     Breath sounds: Normal breath sounds. No wheezing or rales.  Chest:     Chest wall: No tenderness.  Abdominal:     General: Bowel sounds are  normal. There is no distension.     Palpations: Abdomen is soft. There is no mass.     Tenderness: There is no abdominal tenderness. There is no guarding or rebound.  Genitourinary:    Penis: Normal. No tenderness.      Prostate: Normal.     Rectum: Normal. Guaiac result negative.  Musculoskeletal: Normal range of motion.        General: No tenderness.  Lymphadenopathy:     Cervical: No cervical adenopathy.  Skin:    General: Skin is warm and dry.     Coloration: Skin is not pale.     Findings: No erythema or rash.  Neurological:     Mental Status: He is alert and oriented to person, place, and time.     Cranial Nerves: No cranial nerve deficit.     Motor: No abnormal muscle tone.     Coordination: Coordination normal.     Deep Tendon Reflexes: Reflexes are normal and symmetric. Reflexes normal.  Psychiatric:        Behavior: Behavior normal.        Thought Content: Thought content normal.        Judgment:  Judgment normal.           Assessment & Plan:  Well exam. We discussed diet and exercise. Get fasting labs.  [name], MD

## 2019-03-12 ENCOUNTER — Ambulatory Visit (INDEPENDENT_AMBULATORY_CARE_PROVIDER_SITE_OTHER): Payer: Medicare Other | Admitting: *Deleted

## 2019-03-12 ENCOUNTER — Encounter (HOSPITAL_COMMUNITY): Payer: Self-pay

## 2019-03-12 DIAGNOSIS — J309 Allergic rhinitis, unspecified: Secondary | ICD-10-CM | POA: Diagnosis not present

## 2019-03-15 ENCOUNTER — Other Ambulatory Visit: Payer: Self-pay | Admitting: *Deleted

## 2019-03-15 ENCOUNTER — Ambulatory Visit (INDEPENDENT_AMBULATORY_CARE_PROVIDER_SITE_OTHER): Payer: Medicare Other

## 2019-03-15 ENCOUNTER — Encounter (HOSPITAL_COMMUNITY): Payer: Self-pay

## 2019-03-15 ENCOUNTER — Telehealth: Payer: Self-pay | Admitting: Family Medicine

## 2019-03-15 DIAGNOSIS — Z95810 Presence of automatic (implantable) cardiac defibrillator: Secondary | ICD-10-CM | POA: Diagnosis not present

## 2019-03-15 DIAGNOSIS — I5020 Unspecified systolic (congestive) heart failure: Secondary | ICD-10-CM

## 2019-03-15 DIAGNOSIS — E039 Hypothyroidism, unspecified: Secondary | ICD-10-CM

## 2019-03-15 MED ORDER — LEVOTHYROXINE SODIUM 100 MCG PO TABS: 100.0000 ug | ORAL_TABLET | Freq: Every day | ORAL | 3 refills | Status: DC

## 2019-03-15 NOTE — Telephone Encounter (Signed)
Patient is calling back for his lab results Please advise [phone]

## 2019-03-16 ENCOUNTER — Telehealth: Payer: Self-pay | Admitting: Family Medicine

## 2019-03-16 NOTE — Telephone Encounter (Signed)
Discussed results with patient, patient expressed understanding. Nothing further needed. ° °

## 2019-03-16 NOTE — Progress Notes (Signed)
EPIC Encounter for ICM Monitoring  Patient Name: Justin [name] is a 85 y.o. male Date: 03/16/2019 Primary Care Physican: Laurey Morale, MD Primary Cardiologist:[place] Electrophysiologist:Klein Bi-V Pacing:84.2% 03/17/2019 Weight: 254 lbs - 255 lbs  Spoke with patient and he is doing well.  He misses cardiac rehab program.   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   He is without complaints today.  Optivol thoracic impedancenormal.  Prescribed: Furosemide80 mgtake take 1 tablet once a day alternating with one twice a day.  Recommendations: No changes and encouraged to call if experiencing any fluid symptoms.  Follow-up plan: ICM clinic phone appointment on8/24/2020.  Copy of ICM check sent to [facility].   3 month ICM trend: 03/15/2019    1 Year ICM trend:       [name], RN 03/16/2019 4:56 PM

## 2019-03-16 NOTE — Telephone Encounter (Signed)
Pt. Called back and given lab results and instructions. Scheduled for repeat labs in 3 months.

## 2019-03-17 ENCOUNTER — Encounter (HOSPITAL_COMMUNITY): Payer: Self-pay

## 2019-03-19 ENCOUNTER — Ambulatory Visit (INDEPENDENT_AMBULATORY_CARE_PROVIDER_SITE_OTHER): Payer: Medicare Other | Admitting: *Deleted

## 2019-03-19 ENCOUNTER — Encounter (HOSPITAL_COMMUNITY): Payer: Self-pay

## 2019-03-19 DIAGNOSIS — J309 Allergic rhinitis, unspecified: Secondary | ICD-10-CM | POA: Diagnosis not present

## 2019-03-22 ENCOUNTER — Encounter (HOSPITAL_COMMUNITY): Payer: Self-pay

## 2019-03-24 ENCOUNTER — Other Ambulatory Visit: Payer: Self-pay

## 2019-03-24 ENCOUNTER — Encounter (HOSPITAL_COMMUNITY): Payer: Self-pay

## 2019-03-26 ENCOUNTER — Ambulatory Visit (INDEPENDENT_AMBULATORY_CARE_PROVIDER_SITE_OTHER): Payer: Medicare Other

## 2019-03-26 ENCOUNTER — Encounter (HOSPITAL_COMMUNITY): Payer: Self-pay

## 2019-03-26 DIAGNOSIS — J309 Allergic rhinitis, unspecified: Secondary | ICD-10-CM | POA: Diagnosis not present

## 2019-03-28 ENCOUNTER — Other Ambulatory Visit: Payer: Self-pay | Admitting: Family Medicine

## 2019-03-29 ENCOUNTER — Encounter (HOSPITAL_COMMUNITY): Payer: Self-pay

## 2019-03-29 DIAGNOSIS — H353221 Exudative age-related macular degeneration, left eye, with active choroidal neovascularization: Secondary | ICD-10-CM | POA: Diagnosis not present

## 2019-03-31 ENCOUNTER — Encounter (HOSPITAL_COMMUNITY): Payer: Self-pay

## 2019-04-02 ENCOUNTER — Ambulatory Visit (INDEPENDENT_AMBULATORY_CARE_PROVIDER_SITE_OTHER): Payer: Medicare Other | Admitting: *Deleted

## 2019-04-02 ENCOUNTER — Encounter (HOSPITAL_COMMUNITY): Payer: Self-pay

## 2019-04-02 DIAGNOSIS — J309 Allergic rhinitis, unspecified: Secondary | ICD-10-CM | POA: Diagnosis not present

## 2019-04-05 ENCOUNTER — Encounter (HOSPITAL_COMMUNITY): Payer: Self-pay

## 2019-04-07 ENCOUNTER — Encounter (HOSPITAL_COMMUNITY): Payer: Self-pay

## 2019-04-07 ENCOUNTER — Ambulatory Visit (INDEPENDENT_AMBULATORY_CARE_PROVIDER_SITE_OTHER): Payer: Medicare Other | Admitting: *Deleted

## 2019-04-07 DIAGNOSIS — I255 Ischemic cardiomyopathy: Secondary | ICD-10-CM

## 2019-04-07 LAB — CUP PACEART REMOTE DEVICE CHECK
Battery Remaining Longevity: 46 mo
Battery Voltage: 2.96 V
Brady Statistic AP VP Percent: 3.37 %
Brady Statistic AP VS Percent: 0.27 %
Brady Statistic AS VP Percent: 89.45 %
Brady Statistic AS VS Percent: 6.91 %
Brady Statistic RA Percent Paced: 3.5 %
Brady Statistic RV Percent Paced: 79.8 %
Date Time Interrogation Session: 20200812083723
HighPow Impedance: 76 Ohm
Implantable Lead Implant Date: 20180907
Implantable Lead Implant Date: 20180907
Implantable Lead Implant Date: 20180907
Implantable Lead Location: 753858
Implantable Lead Location: 753859
Implantable Lead Location: 753860
Implantable Lead Model: 4396
Implantable Lead Model: 5076
Implantable Pulse Generator Implant Date: 20180907
Lead Channel Impedance Value: 323 Ohm
Lead Channel Impedance Value: 342 Ohm
Lead Channel Impedance Value: 399 Ohm
Lead Channel Impedance Value: 456 Ohm
Lead Channel Impedance Value: 570 Ohm
Lead Channel Impedance Value: 836 Ohm
Lead Channel Pacing Threshold Amplitude: 0.875 V
Lead Channel Pacing Threshold Amplitude: 1 V
Lead Channel Pacing Threshold Amplitude: 1.125 V
Lead Channel Pacing Threshold Pulse Width: 0.4 ms
Lead Channel Pacing Threshold Pulse Width: 0.4 ms
Lead Channel Pacing Threshold Pulse Width: 1 ms
Lead Channel Sensing Intrinsic Amplitude: 11 mV
Lead Channel Sensing Intrinsic Amplitude: 11 mV
Lead Channel Sensing Intrinsic Amplitude: 2 mV
Lead Channel Sensing Intrinsic Amplitude: 2 mV
Lead Channel Setting Pacing Amplitude: 1.75 V
Lead Channel Setting Pacing Amplitude: 2 V
Lead Channel Setting Pacing Amplitude: 2.5 V
Lead Channel Setting Pacing Pulse Width: 0.4 ms
Lead Channel Setting Pacing Pulse Width: 1 ms
Lead Channel Setting Sensing Sensitivity: 0.3 mV

## 2019-04-09 ENCOUNTER — Encounter (HOSPITAL_COMMUNITY): Payer: Self-pay

## 2019-04-09 ENCOUNTER — Ambulatory Visit (INDEPENDENT_AMBULATORY_CARE_PROVIDER_SITE_OTHER): Payer: Medicare Other | Admitting: *Deleted

## 2019-04-09 DIAGNOSIS — J309 Allergic rhinitis, unspecified: Secondary | ICD-10-CM | POA: Diagnosis not present

## 2019-04-12 ENCOUNTER — Encounter (HOSPITAL_COMMUNITY): Payer: Self-pay

## 2019-04-13 ENCOUNTER — Telehealth: Payer: Self-pay | Admitting: Radiology

## 2019-04-13 DIAGNOSIS — M25561 Pain in right knee: Secondary | ICD-10-CM

## 2019-04-13 DIAGNOSIS — G8929 Other chronic pain: Secondary | ICD-10-CM

## 2019-04-13 NOTE — Telephone Encounter (Signed)
Faxed per Dr. Marlou Sa request.

## 2019-04-13 NOTE — Telephone Encounter (Signed)
Per Dr. Marlou Sa, send rx to [facility] for quad strengthening prior to revision knee replacement 3x wk x 6 wks.

## 2019-04-14 ENCOUNTER — Encounter (HOSPITAL_COMMUNITY): Payer: Self-pay

## 2019-04-15 ENCOUNTER — Encounter: Payer: Self-pay | Admitting: Cardiology

## 2019-04-15 NOTE — Progress Notes (Signed)
Remote ICD transmission.   

## 2019-04-16 ENCOUNTER — Ambulatory Visit (INDEPENDENT_AMBULATORY_CARE_PROVIDER_SITE_OTHER): Payer: Medicare Other

## 2019-04-16 ENCOUNTER — Encounter (HOSPITAL_COMMUNITY): Payer: Self-pay

## 2019-04-16 DIAGNOSIS — J309 Allergic rhinitis, unspecified: Secondary | ICD-10-CM

## 2019-04-19 ENCOUNTER — Encounter (HOSPITAL_COMMUNITY): Payer: Self-pay

## 2019-04-19 ENCOUNTER — Ambulatory Visit (INDEPENDENT_AMBULATORY_CARE_PROVIDER_SITE_OTHER): Payer: Medicare Other

## 2019-04-19 DIAGNOSIS — I5020 Unspecified systolic (congestive) heart failure: Secondary | ICD-10-CM | POA: Diagnosis not present

## 2019-04-19 DIAGNOSIS — M25561 Pain in right knee: Secondary | ICD-10-CM | POA: Diagnosis not present

## 2019-04-19 DIAGNOSIS — Z95810 Presence of automatic (implantable) cardiac defibrillator: Secondary | ICD-10-CM

## 2019-04-20 NOTE — Progress Notes (Signed)
EPIC Encounter for ICM Monitoring  Patient Name: Justin [name] is a 85 y.o. male Date: 04/20/2019 Primary Care Physican: Laurey Morale, MD Primary Cardiologist:Mc[place]any Electrophysiologist:Klein Bi-V Pacing:78.9% 03/17/2019 Weight: 254 lbs - 255 lbs  Attempted call to patient and unable to reach.  Left detailed message per DPR regarding transmission. Transmission reviewed.   Optivol thoracic impedanceslightly below baseline normal.  Prescribed: Furosemide80 mgtake take 1 tablet once a day alternating with one twice a day.  Recommendations: Left voice mail with ICM number and encouraged to call if experiencing any fluid symptoms.  Follow-up plan: ICM clinic phone appointment on10/01/2019.  Copy of ICM check sent to [facility]   3 month ICM trend: 04/19/2019    1 Year ICM trend:       [name], RN 04/20/2019 12:12 PM

## 2019-04-21 ENCOUNTER — Telehealth: Payer: Self-pay | Admitting: Family Medicine

## 2019-04-21 ENCOUNTER — Telehealth: Payer: Self-pay

## 2019-04-21 ENCOUNTER — Encounter (HOSPITAL_COMMUNITY): Payer: Self-pay

## 2019-04-21 DIAGNOSIS — M25561 Pain in right knee: Secondary | ICD-10-CM | POA: Diagnosis not present

## 2019-04-21 NOTE — Progress Notes (Addendum)
Patient returned call.  He said he is feeling fine and denies any fluid symptoms.  He is scheduled for knee surgery 9/22 due to there is a loose piece from a previous total knee replacement.  Current weight 257 lbs and stable.  No changes and encouraged to call if experiencing any fluid symptoms.

## 2019-04-21 NOTE — Telephone Encounter (Signed)
Pt state that he is having trouble swallowing a sore throat and wanted to come in to the office.  He is aware that we are not seeing anyone with the symptoms of a sore throat but pt wanted me to ask the provider.  Can this pt be scheduled for in the office appointment?

## 2019-04-21 NOTE — Telephone Encounter (Signed)
Remote ICM transmission received.  Attempted call to patient regarding ICM remote transmission and left detailed message, per DPR. 

## 2019-04-22 ENCOUNTER — Telehealth (INDEPENDENT_AMBULATORY_CARE_PROVIDER_SITE_OTHER): Payer: Medicare Other | Admitting: Family Medicine

## 2019-04-22 ENCOUNTER — Other Ambulatory Visit: Payer: Self-pay

## 2019-04-22 ENCOUNTER — Encounter: Payer: Self-pay | Admitting: Family Medicine

## 2019-04-22 DIAGNOSIS — J029 Acute pharyngitis, unspecified: Secondary | ICD-10-CM

## 2019-04-22 MED ORDER — CEPHALEXIN 500 MG PO CAPS: 500.0000 mg | ORAL_CAPSULE | Freq: Three times a day (TID) | ORAL | 0 refills | Status: AC

## 2019-04-22 NOTE — Telephone Encounter (Signed)
Patient can not come into the office. This has to be a virtual visit.

## 2019-04-22 NOTE — Progress Notes (Signed)
Virtual Visit via Telephone Note  I connected with the patient on 04/22/19 at  9:45 AM EDT by telephone and verified that I am speaking with the correct person using two identifiers. We attempted to connect virtually but we had technical difficulties with the audio and video.     I discussed the limitations, risks, security and privacy concerns of performing an evaluation and management service by telephone and the availability of in person appointments. I also discussed with the patient that there may be a patient responsible charge related to this service. The patient expressed understanding and agreed to proceed.  Location patient: home Location provider: work or home office Participants present for the call: patient, provider Patient did not have a visit in the prior 7 days to address this/these issue(s).   History of Present Illness: Here for 5 days of soreness in the left side of the back of the throat. No sinus drainage or headache or fever. No cough or SOB.    Observations/Objective: Patient sounds cheerful and well on the phone. I do not appreciate any SOB. Speech and thought processing are grossly intact. Patient reported vitals:  Assessment and Plan: Pharyngitis, treat with Keflex and warm saltwater gargles. Recheck prn.  [name], MD   Follow Up Instructions:     [phone] 5-10 [phone] 11-20 [phone] 21-30 I did not refer this patient for an OV in the next 24 hours for this/these issue(s).  I discussed the assessment and treatment plan with the patient. The patient was provided an opportunity to ask questions and all were answered. The patient agreed with the plan and demonstrated an understanding of the instructions.   The patient was advised to call back or seek an in-person evaluation if the symptoms worsen or if the condition fails to improve as anticipated.  I provided 16 minutes of non-face-to-face time during this encounter.   [name], MD

## 2019-04-22 NOTE — Telephone Encounter (Signed)
Pt was called and scheduled !

## 2019-04-23 ENCOUNTER — Ambulatory Visit (INDEPENDENT_AMBULATORY_CARE_PROVIDER_SITE_OTHER): Payer: Medicare Other

## 2019-04-23 ENCOUNTER — Encounter (HOSPITAL_COMMUNITY): Payer: Self-pay

## 2019-04-23 DIAGNOSIS — J309 Allergic rhinitis, unspecified: Secondary | ICD-10-CM | POA: Diagnosis not present

## 2019-04-23 DIAGNOSIS — M25561 Pain in right knee: Secondary | ICD-10-CM | POA: Diagnosis not present

## 2019-04-26 ENCOUNTER — Encounter (HOSPITAL_COMMUNITY): Payer: Self-pay

## 2019-04-26 ENCOUNTER — Telehealth: Payer: Self-pay | Admitting: *Deleted

## 2019-04-26 DIAGNOSIS — M25561 Pain in right knee: Secondary | ICD-10-CM | POA: Diagnosis not present

## 2019-04-26 NOTE — Telephone Encounter (Signed)
   [facility] Pre-operative Risk Assessment    Request for surgical clearance:  1. What type of surgery is being performed? RIGHT PATELLA REMOVAL/REVISION   2. When is this surgery scheduled? 05/18/19  3. What type of clearance is required (medical clearance vs. Pharmacy clearance to hold med vs. Both)? MEDICAL  4. Are there any medications that need to be held prior to surgery and how long? ASA AND PRADAXA   5. Practice name and name of physician performing surgery? [facility]; DR. Nicki Reaper DEAN   6. What is your office phone number [phone]    7.   What is your office fax number [phone]  8.   Anesthesia type (None, local, MAC, general) ? GENERAL [facility] CANAL BLOCK   Julaine Hua 04/26/2019, 5:05 PM  _________________________________________________________________   (provider comments below)

## 2019-04-27 ENCOUNTER — Telehealth: Payer: Self-pay

## 2019-04-27 DIAGNOSIS — I493 Ventricular premature depolarization: Secondary | ICD-10-CM

## 2019-04-27 NOTE — Telephone Encounter (Signed)
Patient with diagnosis of ATRIAL FIBRILLATION on PRADAXA for anticoagulation.    Procedure:RIGHT PATELLA REVISIO Date of procedure: 05-18-2019  CHADS2-VASc score of  5 (CHF, HTN, AGE X 2,CAD)  CrCl = 95ML/MIN Platelet count 227 ON 03/11/2019  Per office protocol, patient can hold PRAXADA for 3 days prior to procedure .

## 2019-04-27 NOTE — Telephone Encounter (Signed)
Reviewed pacer report with pt. He agrees with repeat echo. Pt states he is also having a knee repair in a few weeks. His surgeon will send in surgical clearance if needed.

## 2019-04-27 NOTE — Telephone Encounter (Signed)
Patient has plans for right patella removal/revision on 05/18/2019 and surgeon is requesting to hold home Aspirin and Pradaxa. 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 Last cath in 2018 showed 4/4 paten grafts in 08/2016. Recent ICD download indicated zero atrial fibrillation burden but increase in PVC burden, so Dr. [name] recommended repeat Echo. Will await Echo result prior to addressing surgical clearance. But in the meantime, will route to Pharmacy for input on holding Pradaxa.   Darreld Mclean, PA-C 04/27/2019 9:12 AM

## 2019-04-28 ENCOUNTER — Telehealth: Payer: Self-pay | Admitting: Pulmonary Disease

## 2019-04-28 DIAGNOSIS — Z85828 Personal history of other malignant neoplasm of skin: Secondary | ICD-10-CM | POA: Diagnosis not present

## 2019-04-28 DIAGNOSIS — L853 Xerosis cutis: Secondary | ICD-10-CM | POA: Diagnosis not present

## 2019-04-28 DIAGNOSIS — L819 Disorder of pigmentation, unspecified: Secondary | ICD-10-CM | POA: Diagnosis not present

## 2019-04-28 DIAGNOSIS — L570 Actinic keratosis: Secondary | ICD-10-CM | POA: Diagnosis not present

## 2019-04-28 DIAGNOSIS — L218 Other seborrheic dermatitis: Secondary | ICD-10-CM | POA: Diagnosis not present

## 2019-04-28 DIAGNOSIS — M25561 Pain in right knee: Secondary | ICD-10-CM | POA: Diagnosis not present

## 2019-04-28 NOTE — Telephone Encounter (Signed)
Received a fax from [employer]. Patient is currently scheduled for surgery on 05/18/19 for a right patella removal/revision.   Per patient's chart, he has not been seen since February. He was instructed to return in 3 months. As of today, he has not returned to clinic.   Will call patient today to see if I can get him scheduled with a NP. Will keep clearance letter in my purple look at folder.

## 2019-04-29 ENCOUNTER — Other Ambulatory Visit: Payer: Self-pay

## 2019-04-29 ENCOUNTER — Ambulatory Visit (HOSPITAL_COMMUNITY): Payer: Medicare Other | Attending: Internal Medicine

## 2019-04-29 DIAGNOSIS — I493 Ventricular premature depolarization: Secondary | ICD-10-CM | POA: Diagnosis not present

## 2019-04-29 MED ORDER — PERFLUTREN LIPID MICROSPHERE
1.0000 mL | INTRAVENOUS | Status: AC | PRN
  Administered 2019-04-29: 2 mL via INTRAVENOUS

## 2019-04-29 NOTE — Progress Notes (Signed)
@Patient  ID: Justin [name], male    DOB: [birth year], 85 y.o.   MRN: [id number]  Chief Complaint  Patient presents with  . Surgical Clearance    Scheduled for knee surgery for 9/22. States his breathing has been ok since last visit, still having post nasal drip issues.     Referring provider: Laurey Morale, MD  HPI:  85 year old male former smoker followed in our office for mild persistent asthma as well as irritable larynx syndrome.  Patient is also on weekly allergy shots managed by [name].  Dr. [name] follows him for cardiology needs.   PMH: Cough, irritable larynx syndrome, hypothyroidism, testosterone deficiency, BPH, status post CABG x4, status post TAVR, status post ICD placement Smoker/ Smoking History: Former smoker.  Quit 1963.  45.5 pack years. Maintenance: Weekly allergy shots from [name] Pt of: [name]  04/30/2019  - Visit   85 year old male former smoker presenting to our office today as a appointment for surgical clearance.  He is planning on having knee surgery.  Right now he feels that his breathing is stable.  Patient continues to get weekly allergy shots managed by [name].  Patient also reports he has been having some intermittent A. fib or ectopic beats which is being monitored by cardiology specifically Dr. [name] per patient.  Patient is actively in physical therapy.  Overall has no acute concerns.  Continues to have chronic postnasal drip.  He is on a significant regimen for this.     Tests:   Imaging: 12/2015 CT sinuses without significant disease  Pulmonary function testing: December 2019 spirometry showed a normal ratio, FEV1 2.0 L 78% predicted  Labs  12/2016 IgE > 180 May 2019 eosinophil 400/dL  FENO:  Lab Results  Component Value Date   NITRICOXIDE 43 08/18/2018    PFT: PFT Results Latest Ref Rng & Units 01/28/2017  FVC-Pre L 2.68  FVC-Predicted Pre % 76  FVC-Post L 2.59  FVC-Predicted Post % 73  Pre FEV1/FVC % % 79   Post FEV1/FCV % % 82  FEV1-Pre L 2.12  FEV1-Predicted Pre % 85  FEV1-Post L 2.13  DLCO UNC% % 57  DLCO COR %Predicted % 81  TLC L 4.67  TLC % Predicted % 72  RV % Predicted % 74    Imaging: Dg Chest 2 View  Result Date: 04/30/2019 CLINICAL DATA:  Surgical clearance.  Unspecified asthma. EXAM: CHEST - 2 VIEW COMPARISON:  Radiographs 12/30/2017 and 05/13/2017. FINDINGS: Stable mild cardiomegaly post median sternotomy, CABG and TAVR procedure. Left subclavian AICD leads are unchanged. The lungs are clear. There is no pleural effusion or pneumothorax. There are stable mild degenerative changes throughout the spine. IMPRESSION: Stable postoperative chest.  No acute cardiopulmonary process. Electronically Signed   By: Richardean Sale M.D.   On: 04/30/2019 12:06      Specialty Problems      Pulmonary Problems   Allergic rhinitis    Qualifier: Diagnosis of  By: Sherlynn Stalls, CMA, Cindy        ACUTE BRONCHITIS    Qualifier: Diagnosis of  By: Sarajane Jews MD, Ishmael Holter       Asthma, intermittent    CXR 05/2014:  Clear Spiro 08/2014: mild airflow obstruction CT sinus 01/08/2016 Neg (motion artifcact)       Irritable larynx syndrome    ENT evaluation negative Follows with allergy Harold Hedge) +postnasal drip, occasional reflux      Cough      Allergies  Allergen Reactions  .  Peanut-Containing Drug Products Anaphylaxis  . Sulfonamide Derivatives Anaphylaxis  . Amlodipine Swelling    Swelling in ankles  . Eliquis [Apixaban] Other (See Comments)    Back/hip pain  . Lisinopril Cough    cough  . Xarelto [Rivaroxaban] Other (See Comments)    Back/hip pain    Immunization History  Administered Date(s) Administered  . Hep A / Hep B 04/06/2012, 10/07/2012  . Hepatitis B 05/07/2012  . Influenza Split 06/28/2013  . Influenza Whole 06/05/2007, 05/13/2008, 06/06/2009, 05/30/2010  . Influenza, High Dose Seasonal PF 05/15/2018  . Influenza,inj,Quad PF,6+ Mos 05/20/2016  .  Influenza-Unspecified 05/25/2014, 07/08/2015, 05/06/2017  . Pneumococcal Conjugate-13 09/18/2015  . Pneumococcal Polysaccharide-23 05/13/2008, 05/25/2014  . Td 05/30/2010  . Zoster 10/21/2007  . Zoster Recombinat (Shingrix) 05/18/2018    Past Medical History:  Diagnosis Date  . Age-related macular degeneration, dry, both eyes   . Allergic    "24/7; 365 days/year; I'm allergic to pollens, dust, all [place] grasses/trees, mold, mildue, cats, dogs" (01/07/2017)  . Anal fissure   . Asthma    sees [name]   . Benign prostatic hypertrophy    (sees Dr. Denman [place]  . CAD (coronary artery disease)    a. s/p CABG 2007. b. Cath 08/2016 - 4/4 patent grafts.  . Carotid bruit    carotid u/s 10/10: 0.39% bilaterally  . Chronic combined systolic and diastolic CHF (congestive heart failure) ([name])   . Complication of anesthesia 1980s   "w/anal cyst OR, he gave me a saddle block then put a narcotic in spinal cord; had a severe reaction to that" (01/07/2017)  . Congestive heart failure (CHF) ([place])   . ED (erectile dysfunction)   . Family history of adverse reaction to anesthesia    "daughter wakes up during OR" (01/07/2017)  . GERD (gastroesophageal reflux disease)   . Gout   . HTN (hypertension)   . Hx of colonic polyps    (sees Dr. Henrene Pastor)  . Hyperlipidemia   . Hypothyroidism   . Moderate to severe aortic stenosis    a. s/p TAVR 02/2017.  [name] Myocardial infarction ([place]) ~ 2000  . Obesity   . Osteoarthritis    "was in my knees, hands" (01/07/2017 )  . PAF (paroxysmal atrial fibrillation) ([place])    a. documented post TAVR.  [name] Precancerous skin lesion    (sees Dr. Allyson Sabal)  . Prostate cancer ([place]) dx'd ~ 2014  . 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 Packs/day:  3.50  . Years: 13.00  . Pack years: 45.50  . Types: Cigarettes  . Quit date: 1963  . Years since quitting: 57.7  Smokeless Tobacco Never Used   Counseling given: Not Answered   Continue to not smoke  Outpatient Encounter Medications as of 04/30/2019  Medication Sig  . aspirin 81 MG tablet Take 1 tablet (81 mg total) by mouth daily.  [name] atorvastatin (LIPITOR) 20 MG tablet Take 20 mg by mouth daily.  . Azelastine-Fluticasone (DYMISTA) 137-50 MCG/ACT SUSP USE 2 SPRAYS EACH NOSTRIL TWICE DAILY.  . benzonatate (TESSALON) 200 MG capsule Take 200 mg by mouth 3 (three) times daily as needed for cough.  . cephALEXin (KEFLEX) 500 MG capsule Take 1 capsule (500 mg total) by mouth 3 (three) times daily for  10 days.  . cetirizine (ZYRTEC) 10 MG tablet Take 10 mg by mouth daily.  . chlorpheniramine (CHLOR-TRIMETON) 4 MG tablet Take 2 tablets (8 mg total) by mouth at bedtime.  . dabigatran (PRADAXA) 150 MG CAPS capsule Take 1 capsule (150 mg total) by mouth 2 (two) times daily.  [name] EPINEPHrine 0.3 mg/0.3 mL IJ SOAJ injection Inject 0.3 mLs (0.3 mg total) into the muscle as needed for anaphylaxis.  . famotidine (PEPCID) 10 MG tablet Take 10 mg by mouth at bedtime.  . furosemide (LASIX) 80 MG tablet Take 1 tablet (80 mg total) by mouth 2 (two) times daily. TAKE ONE TABLET ONCE A DAY ALTERNATING WITH ONE TABLET TWICE A DAY.  [name] HYDROcodone-homatropine (HYCODAN) 5-1.5 MG/5ML syrup Take 5 mLs by mouth at bedtime and may repeat dose one time if needed.  [name] ipratropium (ATROVENT) 0.03 % nasal spray Place 2 sprays into both nostrils every 6 (six) hours as needed for rhinitis.  [name] isosorbide mononitrate (IMDUR) 30 MG 24 hr tablet TAKE 1 TABLET ONCE DAILY.  [name] Ketotifen Fumarate (ZADITOR OP) Apply 1 drop to eye daily as needed (itchy eyes).   [name] levothyroxine (SYNTHROID) 100 MCG tablet Take 1 tablet (100 mcg total) by mouth daily.  [name] losartan (COZAAR) 25 MG tablet TAKE 1 TABLET EACH DAY.  . metoprolol tartrate  (LOPRESSOR) 25 MG tablet Take 25 mg by mouth 2 (two) times daily.  . montelukast (SINGULAIR) 10 MG tablet Take 1 tablet (10 mg total) by mouth every evening.  . Multiple Vitamins-Minerals (PRESERVISION AREDS PO) Take 2 tablets by mouth every morning.  [name] omeprazole (PRILOSEC) 20 MG capsule TAKE (1) CAPSULE TWICE DAILY.  [name] potassium chloride SA (K-DUR) 20 MEQ tablet Take 2 tablets by mouth twice daily alternating with 2 tablets daily.  [name] triamcinolone cream (KENALOG) 0.1 % Apply 1 application topically daily as needed for dry skin.  . valACYclovir (VALTREX) 500 MG tablet Take 500 mg by mouth 2 (two) times daily as needed (cold sores).  . [DISCONTINUED] chlorpheniramine (CHLOR-TRIMETON) 4 MG tablet Take 8 mg by mouth at bedtime.   No facility-administered encounter medications on file as of 04/30/2019.      Review of Systems  Review of Systems  Constitutional: Negative for activity change, chills, fatigue, fever and unexpected weight change.  HENT: Positive for congestion, postnasal drip and rhinorrhea. Negative for sinus pressure, sinus pain and sore throat.   Eyes: Negative.   Respiratory: Positive for cough. Negative for shortness of breath and wheezing.   Cardiovascular: Negative for chest pain and palpitations.  Gastrointestinal: Negative for constipation, diarrhea, nausea and vomiting.  Endocrine: Negative.   Genitourinary: Negative.   Skin: Negative.   Allergic/Immunologic: Positive for environmental allergies.  Neurological: Negative for dizziness and headaches.  Psychiatric/Behavioral: Negative.  Negative for dysphoric mood. The patient is not nervous/anxious.   All other systems reviewed and are negative.    Physical Exam  BP 104/60   Pulse 64   Temp 98 F (36.7 C) (Oral)   Ht 5' 6.5" (1.689 m)   Wt 262 lb 12.8 oz (119.2 kg)   SpO2 97%   BMI 41.78 kg/m   Wt Readings from Last 5 Encounters:  04/30/19 262 lb 12.8 oz (119.2 kg)  03/11/19 258 lb 3.2 oz (117.1 kg)   02/08/19 252 lb 11.2 oz (114.6 kg)  11/09/18 264 lb (119.7 kg)  09/30/18 264 lb (119.7 kg)     Physical Exam Vitals signs and nursing note reviewed.  Constitutional:  General: He is not in acute distress.    Appearance: Normal appearance. He is obese.  HENT:     Head: Normocephalic and atraumatic.     Right Ear: Hearing, tympanic membrane, ear canal and external ear normal. There is impacted cerumen.     Left Ear: Hearing, tympanic membrane, ear canal and external ear normal. There is impacted cerumen.     Nose: Congestion and rhinorrhea present. No mucosal edema.     Right Turbinates: Not enlarged.     Left Turbinates: Not enlarged.     Mouth/Throat:     Mouth: Mucous membranes are dry.     Pharynx: Oropharynx is clear. No oropharyngeal exudate.     Comments: Postnasal drip Eyes:     Pupils: Pupils are equal, round, and reactive to light.  Neck:     Musculoskeletal: Normal range of motion.  Cardiovascular:     Rate and Rhythm: Normal rate. Rhythm regularly irregular. Frequent extrasystoles are present.    Pulses: Normal pulses.     Heart sounds: Normal heart sounds. No murmur.  Pulmonary:     Effort: Pulmonary effort is normal.     Breath sounds: Normal breath sounds. No decreased breath sounds, wheezing or rales.  Abdominal:     General: Bowel sounds are normal. There is no distension.     Palpations: Abdomen is soft.     Tenderness: There is no abdominal tenderness.  Musculoskeletal:     Right lower leg: No edema.     Left lower leg: No edema.  Lymphadenopathy:     Cervical: No cervical adenopathy.  Skin:    General: Skin is warm and dry.     Capillary Refill: Capillary refill takes less than 2 seconds.     Findings: No erythema or rash.  Neurological:     General: No focal deficit present.     Mental Status: He is alert and oriented to person, place, and time.     Motor: No weakness.     Coordination: Coordination normal.     Gait: Gait is intact. Gait  normal.  Psychiatric:        Mood and Affect: Mood normal.        Behavior: Behavior normal. Behavior is cooperative.        Thought Content: Thought content normal.        Judgment: Judgment normal.      Lab Results:  CBC    Component Value Date/Time   WBC 10.9 (H) 03/11/2019 0858   RBC 4.98 03/11/2019 0858   HGB 13.7 03/11/2019 0858   HGB 13.9 04/23/2017 1012   HCT 42.1 03/11/2019 0858   HCT 42.1 04/23/2017 1012   PLT 227.0 03/11/2019 0858   PLT 241 04/23/2017 1012   MCV 84.6 03/11/2019 0858   MCV 84 04/23/2017 1012   MCH 27.9 04/23/2017 1012   MCH 27.0 03/07/2017 0231   MCHC 32.5 03/11/2019 0858   RDW 16.5 (H) 03/11/2019 0858   RDW 15.8 (H) 04/23/2017 1012   LYMPHSABS 2.5 03/11/2019 0858   LYMPHSABS 2.0 04/23/2017 1012   MONOABS 1.1 (H) 03/11/2019 0858   EOSABS 0.4 03/11/2019 0858   EOSABS 0.3 04/23/2017 1012   BASOSABS 0.1 03/11/2019 0858   BASOSABS 0.1 04/23/2017 1012    BMET    Component Value Date/Time   NA 140 03/11/2019 0858   NA 142 04/23/2017 1012   K 4.1 03/11/2019 0858   CL 102 03/11/2019 0858   CO2 29 03/11/2019 0858  GLUCOSE 99 03/11/2019 0858   GLUCOSE 87 06/30/2006 1132   BUN 18 03/11/2019 0858   BUN 13 04/23/2017 1012   CREATININE 0.99 03/11/2019 0858   CREATININE 0.74 08/14/2016 1311   CALCIUM 8.6 03/11/2019 0858   GFRNONAA 87 04/23/2017 1012   GFRAA 101 04/23/2017 1012    BNP    Component Value Date/Time   BNP 653.3 (H) 01/07/2017 1705    ProBNP No results found for: PROBNP    Assessment & Plan:   Allergic rhinitis Plan: Continue Dymista Continue Singulair, Continue daily antihistamine Continue nasal saline rinses Recommend starting chlor tabs at night in place of diphenhydramine Continue weekly allergy injections Continue follow-up with [name] Establish with Dr. Vaughan Browner in 6 months  Asthma, intermittent Plan: Continue respiratory meds as prescribed Continue follow-up with allergy Continue allergy  injections Recommending stopping diphenhydramine and trying chlor tabs to help with postnasal drip  Irritable larynx syndrome Plan: Continue irritable larynx meds/allergy meds as prescribed Continue follow-up with allergy Continue allergy injections   Gastroesophageal reflux disease Plan: Continue omeprazole Continue Pepcid  Surgical counseling visit Plan: Chest x-ray today Patient okay to proceed forward with surgery.  Patient is a low to moderate risk for pulmonary complications see below.  Peri-operative Assessment of Pulmonary Risk for Non-Thoracic Surgery:  For Mr. Bachtell, low to moderate risk of perioperative pulmonary complications is increased by:  Age greater than 65 years  Asthma - stable  Chronic immunotherapy managed by Dr.Padgett   Respiratory complications generally occur in 1% of ASA Class I patients, 5% of ASA Class II and 10% of ASA Class III-IV patients These complications rarely result in mortality and iclude postoperative pneumonia, atelectasis, pulmonary embolism, ARDS and increased time requiring postoperative mechanical ventilation.  Overall, I recommend proceeding with the surgery if the risk for respiratory complications are outweighed by the potential benefits. This will need to be discussed between the patient and surgeon.  To reduce risks of respiratory complications, I recommend: --Pre- and post-operative incentive spirometry performed frequently while awake --Avoiding use of pancuronium during anesthesia.  I have discussed the risk factors and recommendations above with the patient. Pt can proceed forward with surgery.   Cough Plan: Continue outlined management for cough Can also use Delsym over-the-counter cough medicine  Bilateral impacted cerumen Plan: Can use Debrox over-the-counter eardrops Also recommend home flush kits for removal of earwax If symptoms worsen such as dizziness, ear pain, difficulty hearing may need to consider  follow-up with primary care or referral to ENT    Return in about 6 months (around 10/28/2019), or if symptoms worsen or fail to improve, for Follow up with Dr. Vaughan Browner - 63min new pt appt .   [name], NP 04/30/2019   This appointment was 32 minutes long with over 50% of the time in direct face-to-face patient care, assessment, plan of care, and follow-up.

## 2019-04-29 NOTE — Telephone Encounter (Signed)
Spoke with patient. He has been scheduled to see Justin [name] tomorrow at 1130am for a surgical clearance appointment. Patient is aware of appointment and location. Nothing further needed.

## 2019-04-30 ENCOUNTER — Ambulatory Visit (INDEPENDENT_AMBULATORY_CARE_PROVIDER_SITE_OTHER): Payer: Medicare Other | Admitting: Pulmonary Disease

## 2019-04-30 ENCOUNTER — Ambulatory Visit (INDEPENDENT_AMBULATORY_CARE_PROVIDER_SITE_OTHER): Payer: Medicare Other

## 2019-04-30 ENCOUNTER — Encounter: Payer: Self-pay | Admitting: Pulmonary Disease

## 2019-04-30 VITALS — BP 104/60 | HR 64 | Temp 98.0°F | Ht 66.5 in | Wt 262.8 lb

## 2019-04-30 DIAGNOSIS — R05 Cough: Secondary | ICD-10-CM | POA: Diagnosis not present

## 2019-04-30 DIAGNOSIS — J3089 Other allergic rhinitis: Secondary | ICD-10-CM | POA: Diagnosis not present

## 2019-04-30 DIAGNOSIS — H6123 Impacted cerumen, bilateral: Secondary | ICD-10-CM

## 2019-04-30 DIAGNOSIS — Z7189 Other specified counseling: Secondary | ICD-10-CM

## 2019-04-30 DIAGNOSIS — J45909 Unspecified asthma, uncomplicated: Secondary | ICD-10-CM | POA: Diagnosis not present

## 2019-04-30 DIAGNOSIS — M25561 Pain in right knee: Secondary | ICD-10-CM | POA: Diagnosis not present

## 2019-04-30 DIAGNOSIS — J387 Other diseases of larynx: Secondary | ICD-10-CM

## 2019-04-30 DIAGNOSIS — R059 Cough, unspecified: Secondary | ICD-10-CM | POA: Insufficient documentation

## 2019-04-30 DIAGNOSIS — J4520 Mild intermittent asthma, uncomplicated: Secondary | ICD-10-CM

## 2019-04-30 DIAGNOSIS — K219 Gastro-esophageal reflux disease without esophagitis: Secondary | ICD-10-CM | POA: Diagnosis not present

## 2019-04-30 MED ORDER — CHLORPHENIRAMINE MALEATE 4 MG PO TABS: 8.0000 mg | ORAL_TABLET | Freq: Every day | ORAL | 12 refills | Status: DC

## 2019-04-30 NOTE — Assessment & Plan Note (Signed)
Plan: Can use Debrox over-the-counter eardrops Also recommend home flush kits for removal of earwax If symptoms worsen such as dizziness, ear pain, difficulty hearing may need to consider follow-up with primary care or referral to ENT

## 2019-04-30 NOTE — Assessment & Plan Note (Signed)
Plan: Continue respiratory meds as prescribed Continue follow-up with allergy Continue allergy injections Recommending stopping diphenhydramine and trying chlor tabs to help with postnasal drip

## 2019-04-30 NOTE — Assessment & Plan Note (Signed)
Plan: Continue outlined management for cough Can also use Delsym over-the-counter cough medicine

## 2019-04-30 NOTE — Assessment & Plan Note (Signed)
Plan: Chest x-ray today Patient okay to proceed forward with surgery.  Patient is a low to moderate risk for pulmonary complications see below.  Peri-operative Assessment of Pulmonary Risk for Non-Thoracic Surgery:  For Mr. Ruffins, low to moderate risk of perioperative pulmonary complications is increased by:  Age greater than 65 years  Asthma - stable  Chronic immunotherapy managed by Dr.Padgett   Respiratory complications generally occur in 1% of ASA Class I patients, 5% of ASA Class II and 10% of ASA Class III-IV patients These complications rarely result in mortality and iclude postoperative pneumonia, atelectasis, pulmonary embolism, ARDS and increased time requiring postoperative mechanical ventilation.  Overall, I recommend proceeding with the surgery if the risk for respiratory complications are outweighed by the potential benefits. This will need to be discussed between the patient and surgeon.  To reduce risks of respiratory complications, I recommend: --Pre- and post-operative incentive spirometry performed frequently while awake --Avoiding use of pancuronium during anesthesia.  I have discussed the risk factors and recommendations above with the patient. Pt can proceed forward with surgery.

## 2019-04-30 NOTE — Patient Instructions (Addendum)
You were seen today by Justin Rinne, NP  for:   1. Surgical counseling visit  - DG Chest 2 View; Future  Peri-operative Assessment of Pulmonary Risk for Non-Thoracic Surgery:  Justin [name], low to moderate risk of perioperative pulmonary complications is increased by:  Age greater than 65 years  Asthma - stable  Chronic immunotherapy managed by  Justin [name]   Respiratory complications generally occur in 1% of ASA Class I patients, 5% of ASA Class II and 10% of ASA Class III-IV patients These complications rarely result in mortality and iclude postoperative pneumonia, atelectasis, pulmonary embolism, ARDS and increased time requiring postoperative mechanical ventilation.  Overall, I recommend proceeding with the surgery if the risk for respiratory complications are outweighed by the potential benefits. This will need to be discussed between the patient and surgeon.  To reduce risks of respiratory complications, I recommend: --Pre- and post-operative incentive spirometry performed frequently while awake --Avoiding use of pancuronium during anesthesia.  I have discussed the risk factors and recommendations above with the patient. Pt can proceed forward with surgery.    2. Intermittent asthma, unspecified asthma severity, unspecified whether complicated  I will have the patient establish with Justin [name] in 6 months for pulmonary care  Continue outlined asthma management Continue weekly allergy shots managed by Justin [name] Continue daily Singulair  I would recommending stopping diphenhydramine and starting:  Please start taking chlorpheniramine (aka Chlor tabs) 4 mg tablet (1 to 2 tablets at night) for management of allergies and postnasal drip at night >>> This is an over-the-counter medication >>> This medication is sedating   - DG Chest 2 View; Future  3. Non-seasonal allergic rhinitis due to other allergic trigger  Continue outlined management by Justin [name]  Stop taking  diphenhydramine and start taking chlor tabs  4. Cough  Continue antibiotics outlined by Justin [name] Continue irritable larynx management and outlined medications  5. Gastroesophageal reflux disease, esophagitis presence not specified  Omeprazole 20 mg tablet  >>>Please take 1 tablet daily 15 minutes to 30 minutes before your first meal of the day as well as before your other medications >>>Try to take at the same time each day >>>take this medication daily  Continue Pepcid  GERD management: >>>Avoid laying flat until 2 hours after meals >>>Elevate head of the bed including entire chest >>>Reduce size of meals and amount of fat, acid, spices, caffeine and sweets >>>If you are smoking, Please stop! >>>Decrease alcohol consumption >>>Work on maintaining a healthy weight with normal BMI      We recommend today:  Orders Placed This Encounter  Procedures  . DG Chest 2 View    Standing Status:   Future    Number of Occurrences:   1    Standing Expiration Date:   06/29/2020    Order Specific Question:   Reason for Exam (SYMPTOM  OR DIAGNOSIS REQUIRED)    Answer:   surg cleareance    Order Specific Question:   Preferred imaging location?    Answer:   Internal    Order Specific Question:   Radiology Contrast Protocol - do NOT remove file path    Answer:   [email or web address]   Orders Placed This Encounter  Procedures  . DG Chest 2 View   No orders of the defined types were placed in this encounter.   Follow Up:    Return in about 6 months (around 10/28/2019), or if symptoms worsen or fail to improve, for Follow up with Justin [name] - 75min  new pt appt .   Please do your part to reduce the spread of COVID-19:      Reduce your risk of any infection  and COVID19 by using the similar precautions used for avoiding the common cold or flu:  [name] Wash your hands often with soap and warm water for at least 20 seconds.  If soap and water are not  readily available, use an alcohol-based hand sanitizer with at least 60% alcohol.  . If coughing or sneezing, cover your mouth and nose by coughing or sneezing into the elbow areas of your shirt or coat, into a tissue or into your sleeve (not your hands). Justin [name] A MASK when in public  . Avoid shaking hands with others and consider head nods or verbal greetings only. . Avoid touching your eyes, nose, or mouth with unwashed hands.  . Avoid close contact with people who are sick. . Avoid places or events with large numbers of people in one location, like concerts or sporting events. . If you have some symptoms but not all symptoms, continue to monitor at home and seek medical attention if your symptoms worsen. . If you are having a medical emergency, call 911.   [place] / e-Visit: [email or web address]         [facility]: [facility]: [id number]                   [facility]: [id number]     It is flu season:   >>> Best ways to protect herself from the flu: Receive the yearly flu vaccine, practice good hand hygiene washing with soap and also using hand sanitizer when available, eat a nutritious meals, get adequate rest, hydrate appropriately   Please contact the office if your symptoms worsen or you have concerns that you are not improving.   Thank you for choosing [facility] for your healthcare, and for allowing [place] to partner with you on your healthcare journey. I am thankful to be able to provide care to you today.   Justin Quaker FNP-C

## 2019-04-30 NOTE — Assessment & Plan Note (Signed)
Plan: Continue irritable larynx meds/allergy meds as prescribed Continue follow-up with allergy Continue allergy injections

## 2019-04-30 NOTE — Progress Notes (Signed)
Your chest x-ray results of come back.  Showing no acute changes.  No plan of care changes at this time.  Keep follow-up appointment.    Follow-up with our office if symptoms worsen or you do not feel like you are improving under her current regimen.  It was a pleasure taking care of you,  Brian Mack, FNP 

## 2019-04-30 NOTE — Assessment & Plan Note (Signed)
Plan: Continue omeprazole Continue Pepcid  

## 2019-04-30 NOTE — Assessment & Plan Note (Signed)
Plan: Continue Dymista Continue Singulair, Continue daily antihistamine Continue nasal saline rinses Recommend starting chlor tabs at night in place of diphenhydramine Continue weekly allergy injections Continue follow-up with [name] Establish with Dr. Vaughan Browner in 6 months

## 2019-05-04 ENCOUNTER — Telehealth: Payer: Self-pay | Admitting: Orthopedic Surgery

## 2019-05-04 ENCOUNTER — Ambulatory Visit (INDEPENDENT_AMBULATORY_CARE_PROVIDER_SITE_OTHER): Payer: Medicare Other

## 2019-05-04 DIAGNOSIS — J309 Allergic rhinitis, unspecified: Secondary | ICD-10-CM | POA: Diagnosis not present

## 2019-05-04 DIAGNOSIS — M25561 Pain in right knee: Secondary | ICD-10-CM | POA: Diagnosis not present

## 2019-05-04 NOTE — Progress Notes (Signed)
Reviewed, agree 

## 2019-05-04 NOTE — Telephone Encounter (Signed)
Ok for asa before surgery pls claal thx

## 2019-05-04 NOTE — Telephone Encounter (Signed)
   Primary Cardiologist: [name], MD  Chart reviewed as part of pre-operative protocol coverage. Patient was contacted 05/04/2019 in reference to pre-operative risk assessment for pending surgery as outlined below.  Justin [name] was last seen on 02/08/2019 via virtual visit by Dr. Angelena Form.  Since that day, Justin [name] has done well. He was able to stack 150 boxes this past weekend without any exertional chest pain or shortness of breath. Recent echocardiogram shows stable ejection fraction (35-40% as compare to previous 40-45%). Since he is physically active without exertional symptom, he is cleared to proceed with surgery. His RCRI score is class III 6.6% perioperative risk for major cardiac event.  Therefore, based on ACC/AHA guidelines, the patient would be at acceptable risk for the planned procedure without further cardiovascular testing.   I have informed the patient to hold pradaxa for 3 days prior to surgery. I am currently waiting on callback from Dr. Randel Pigg surgery scheduler to see if Dr. Marlou Sa would be willing to perform the surgery on aspirin or does aspirin must be held prior to surgery. Once that question has been answered, I will forward clearance to the requesting party  Almyra Deforest, PA 05/04/2019, 10:41 AM

## 2019-05-04 NOTE — Telephone Encounter (Signed)
Patient scheduled for right patella removal/revision 05-18-19 with Dr. Marlou Sa at [facility].  Cardiac Clearance request sent.  Almyra Deforest, PA left message on voice mail this morning.  Please advise in regards to the following message:      Based on ACC/AHA guidelines, the patient would be at acceptable risk for the planned procedure without further cardiovascular testing.   I have informed the patient to hold pradaxa for 3 days prior to surgery. I am currently waiting on callback from Dr. Randel Pigg surgery scheduler to see if Dr. Marlou Sa would be willing to perform the surgery on aspirin or does aspirin must be held prior to surgery. Once that question has been answered, I will forward clearance to the requesting party  Almyra Deforest, PA 05/04/2019, 10:41 AM

## 2019-05-04 NOTE — Telephone Encounter (Signed)
Please advise. Thanks.  

## 2019-05-04 NOTE — Telephone Encounter (Signed)
See response from Dr Dean. 

## 2019-05-06 ENCOUNTER — Ambulatory Visit (INDEPENDENT_AMBULATORY_CARE_PROVIDER_SITE_OTHER): Payer: Medicare Other

## 2019-05-06 ENCOUNTER — Other Ambulatory Visit: Payer: Self-pay

## 2019-05-06 DIAGNOSIS — Z23 Encounter for immunization: Secondary | ICD-10-CM

## 2019-05-06 DIAGNOSIS — M25561 Pain in right knee: Secondary | ICD-10-CM | POA: Diagnosis not present

## 2019-05-06 NOTE — Telephone Encounter (Signed)
LM2CB-for surgery scheduler-discuss ASA

## 2019-05-06 NOTE — Patient Instructions (Signed)
Health Maintenance Due  Topic Date Due  . INFLUENZA VACCINE  03/27/2019    Depression screen PHQ 2/9 03/11/2019  Decreased Interest 0  Down, Depressed, Hopeless 0  PHQ - 2 Score 0  Some recent data might be hidden

## 2019-05-07 ENCOUNTER — Other Ambulatory Visit: Payer: Self-pay | Admitting: Family Medicine

## 2019-05-07 NOTE — Telephone Encounter (Signed)
Spoke with Almyra Deforest, PA with [facility]. Per Dr. Marlou Sa, patient can continue ASA for surgery. Isaac Laud will provide clearance for surgery.

## 2019-05-07 NOTE — Telephone Encounter (Signed)
I have called and discussed with Dr. Randel Pigg surgery scheduler, ok per Dr. Marlou Sa to continue on the aspirin, no need to hold prior to surgery. See my previous note regarding clearance and how long to hold pradaxa. I will forward this letter to Dr. Randel Pigg office

## 2019-05-07 NOTE — Telephone Encounter (Signed)
Contacted Dr Deans office, left message for the surgery scheduler informing her of Justin [name]'s recommendations and advised her to contact our office and speak with someone in the preoperative pool.

## 2019-05-10 ENCOUNTER — Ambulatory Visit (INDEPENDENT_AMBULATORY_CARE_PROVIDER_SITE_OTHER): Payer: Medicare Other

## 2019-05-10 DIAGNOSIS — J309 Allergic rhinitis, unspecified: Secondary | ICD-10-CM

## 2019-05-10 DIAGNOSIS — H353221 Exudative age-related macular degeneration, left eye, with active choroidal neovascularization: Secondary | ICD-10-CM | POA: Diagnosis not present

## 2019-05-10 DIAGNOSIS — M25561 Pain in right knee: Secondary | ICD-10-CM | POA: Diagnosis not present

## 2019-05-11 NOTE — Pre-Procedure Instructions (Signed)
[address] [place] [address] Phone: [phone] Fax: [phone]     Your procedure is scheduled on Tuesday September 22nd.  Report to [facility] Main Entrance "A" at 5:30 A.M., and check in at the Admitting office.  Call this number if you have problems the morning of surgery:  [phone]  Call [phone] if you have any questions prior to your surgery date Monday-Friday 8am-4pm    Remember:  Do not eat  after midnight the night before your surgery  You may drink clear liquids until 4:15 the morning of your surgery.   Clear liquids allowed are: Water, Non-Citrus Juices (without pulp), Carbonated Beverages, Clear Tea, Black Coffee Only, and Gatorade. Your surgeon has ordered an Ensure Pre-surgery Carbohydrate Drink for you. Drink all in one sitting, do not sip. Please make sure you complete by 4:15 AM the morning of your surgery. Patient Instructions  . The night before surgery:  o No food after midnight. ONLY clear liquids after midnight  . The day of surgery (if you do NOT have diabetes):  o Drink ONE (1) Pre-Surgery Clear Ensure as directed.   o This drink was given to you during your hospital  pre-op appointment visit. o The pre-op nurse will instruct you on the time to drink the  Pre-Surgery Ensure depending on your surgery time. o Finish the drink at the designated time by the pre-op nurse- 4:15 AM  o Nothing else to drink after completing the  Pre-Surgery Clear Ensure.         If you have questions, please contact your surgeon's office.     Take these medicines the morning of surgery with A SIP OF WATER Azelastine-Fluticasone (DYMISTA)  cetirizine (ZYRTEC) ipratropium (ATROVENT) isosorbide mononitrate (IMDUR)  levothyroxine (SYNTHROID) metoprolol tartrate (LOPRESSOR) omeprazole (PRILOSEC) Ketotifen Fumarate (ZADITOR OP) if needed  Follow your surgeon's instructions on  when to stop Asprin and dabigatran (PRADAXA). If no instructions were given by your surgeon then you will need to call the office to get those instructions.    7 days prior to surgery STOP taking any Aspirin (unless otherwise instructed by your surgeon), Aleve, Naproxen, Ibuprofen, Motrin, Advil, Goody's, BC's, all herbal medications, fish oil, and all vitamins.    The Morning of Surgery  Do not wear jewelry, make-up or nail polish.  Do not wear lotions, powders, or perfumes/colognes, or deodorant  Do not shave 48 hours prior to surgery.  Men may shave face and neck.  Do not bring valuables to the hospital.  [facility] is not responsible for any belongings or valuables.  If you are a smoker, DO NOT Smoke 24 hours prior to surgery IF you wear a CPAP at night please bring your mask, tubing, and machine the morning of surgery   Remember that you must have someone to transport you home after your surgery, and remain with you for 24 hours if you are discharged the same day.   Contacts, glasses, hearing aids, dentures or bridgework may not be worn into surgery.    Leave your suitcase in the car.  After surgery it may be brought to your room.  For patients admitted to the hospital, discharge time will be determined by your treatment team.  Patients discharged the day of surgery will not be allowed to drive home.    Special instructions:   [facility]- Preparing For Surgery  Before surgery, you can play an important role. Because  skin is not sterile, your skin needs to be as free of germs as possible. You can reduce the number of germs on your skin by washing with CHG (chlorahexidine gluconate) Soap before surgery.  CHG is an antiseptic cleaner which kills germs and bonds with the skin to continue killing germs even after washing.    Oral Hygiene is also important to reduce your risk of infection.  Remember - BRUSH YOUR TEETH THE MORNING OF SURGERY WITH YOUR REGULAR TOOTHPASTE  Please do  not use if you have an allergy to CHG or antibacterial soaps. If your skin becomes reddened/irritated stop using the CHG.  Do not shave (including legs and underarms) for at least 48 hours prior to first CHG shower. It is OK to shave your face.  Please follow these instructions carefully.   1. Shower the NIGHT BEFORE SURGERY and the MORNING OF SURGERY with CHG Soap.   2. If you chose to wash your hair, wash your hair first as usual with your normal shampoo.  3. After you shampoo, rinse your hair and body thoroughly to remove the shampoo.  4. Use CHG as you would any other liquid soap. You can apply CHG directly to the skin and wash gently with a scrungie or a clean washcloth.   5. Apply the CHG Soap to your body ONLY FROM THE NECK DOWN.  Do not use on open wounds or open sores. Avoid contact with your eyes, ears, mouth and genitals (private parts). Wash Face and genitals (private parts)  with your normal soap.   6. Wash thoroughly, paying special attention to the area where your surgery will be performed.  7. Thoroughly rinse your body with warm water from the neck down.  8. DO NOT shower/wash with your normal soap after using and rinsing off the CHG Soap.  9. Pat yourself dry with a CLEAN TOWEL.  10. Wear CLEAN PAJAMAS to bed the night before surgery, wear comfortable clothes the morning of surgery  11. Place CLEAN SHEETS on your bed the night of your first shower and DO NOT SLEEP WITH PETS.    Day of Surgery:  Do not apply any deodorants/lotions. Please shower the morning of surgery with the CHG soap  Please wear clean clothes to the hospital/surgery center.   Remember to brush your teeth WITH YOUR REGULAR TOOTHPASTE.   Please read over the following fact sheets that you were given.

## 2019-05-12 ENCOUNTER — Encounter (HOSPITAL_COMMUNITY): Payer: Self-pay

## 2019-05-12 ENCOUNTER — Encounter (HOSPITAL_COMMUNITY)
Admission: RE | Admit: 2019-05-12 | Discharge: 2019-05-12 | Disposition: A | Discharge status: Home / Self Care | Payer: Medicare Other | Source: Ambulatory Visit | Attending: Orthopedic Surgery | Admitting: Orthopedic Surgery

## 2019-05-12 ENCOUNTER — Other Ambulatory Visit: Payer: Self-pay

## 2019-05-12 DIAGNOSIS — Z01812 Encounter for preprocedural laboratory examination: Secondary | ICD-10-CM | POA: Diagnosis not present

## 2019-05-12 DIAGNOSIS — M25561 Pain in right knee: Secondary | ICD-10-CM | POA: Diagnosis not present

## 2019-05-12 LAB — TYPE AND SCREEN
ABO/RH(D): A POS
Antibody Screen: NEGATIVE

## 2019-05-12 LAB — URINALYSIS, ROUTINE W REFLEX MICROSCOPIC
Bilirubin Urine: NEGATIVE
Glucose, UA: NEGATIVE mg/dL
Hgb urine dipstick: NEGATIVE
Ketones, ur: NEGATIVE mg/dL
Leukocytes,Ua: NEGATIVE
Nitrite: NEGATIVE
Protein, ur: NEGATIVE mg/dL
Specific Gravity, Urine: 1.01 (ref 1.005–1.030)
pH: 5 (ref 5.0–8.0)

## 2019-05-12 LAB — BASIC METABOLIC PANEL
Anion gap: 9 (ref 5–15)
BUN: 17 mg/dL (ref 8–23)
CO2: 26 mmol/L (ref 22–32)
Calcium: 8.6 mg/dL — ABNORMAL LOW (ref 8.9–10.3)
Chloride: 102 mmol/L (ref 98–111)
Creatinine, Ser: 0.82 mg/dL (ref 0.61–1.24)
GFR calc Af Amer: >60 mL/min (ref >60)
GFR calc non Af Amer: >60 mL/min (ref >60)
Glucose, Bld: 105 mg/dL — ABNORMAL HIGH (ref 70–99)
Potassium: 3.5 mmol/L (ref 3.5–5.1)
Sodium: 137 mmol/L (ref 135–145)

## 2019-05-12 LAB — CBC
HCT: 41.3 % (ref 39.0–52.0)
Hemoglobin: 13.3 g/dL (ref 13.0–17.0)
MCH: 27.6 pg (ref 26.0–34.0)
MCHC: 32.2 g/dL (ref 30.0–36.0)
MCV: 85.7 fL (ref 80.0–100.0)
Platelets: 205 10*3/uL (ref 150–400)
RBC: 4.82 MIL/uL (ref 4.22–5.81)
RDW: 15.6 % — ABNORMAL HIGH (ref 11.5–15.5)
WBC: 11.4 10*3/uL — ABNORMAL HIGH (ref 4.0–10.5)
nRBC: 0 % (ref 0.0–0.2)

## 2019-05-12 LAB — SURGICAL PCR SCREEN
MRSA, PCR: NEGATIVE
Staphylococcus aureus: NEGATIVE

## 2019-05-12 NOTE — Progress Notes (Signed)
PCP - [name] Cardiologist - [place]   PPM/ICD - medtronic Device Orders - faxed to device clinic Rep Notified email sent to [employer] reps  Chest x-ray - 04-30-19 EKG -11/09/18  Stress Test - 2011 ECHO - 04/29/19 Cardiac Cath -08/27/16    Blood Thinner Instructions: last dose of Paradaxa on 9/18 Aspirin Instructions:continue per Cardiology  ERAS Protcol - yes Clears until 0415 am  PRE-SURGERY Ensure - yes given  COVID TEST- planned for 9/18   Anesthesia review: yes, Cardiac history  Patient denies shortness of breath, fever, cough and chest pain at PAT appointment   Patient verbalized understanding of instructions that were given to them at the PAT appointment. Patient was also instructed that they will need to review over the PAT instructions again at home before surgery.

## 2019-05-13 LAB — URINE CULTURE: Culture: NO GROWTH

## 2019-05-13 NOTE — Anesthesia Preprocedure Evaluation (Addendum)
Anesthesia Evaluation  Patient identified by MRN, date of birth, ID band Patient awake  General Assessment Comment:Daughter hard to anesthetize  Reviewed: Allergy & Precautions, NPO status , Patient's Chart, lab work & pertinent test results  Airway Mallampati: II  TM Distance: >3 FB Neck ROM: Full    Dental no notable dental hx. (+) Teeth Intact   Pulmonary asthma , former smoker,    Pulmonary exam normal breath sounds clear to auscultation       Cardiovascular Exercise Tolerance: Good hypertension, + CAD and +CHF  Normal cardiovascular exam+ Cardiac Defibrillator  Rhythm:Regular Rate:Normal  04/29/19 Echo The left ventricle has moderately reduced systolic function, with an ejection fraction of 35- 40%. The cavity size was normal. There is moderately increased left ventricular wall thickness. Left ventricular diastolic Doppler parameters are consistent with impaired relaxation. Indeterminate filling pressures. 2. Severe hypokinesis of the left ventricular, entire anterior wall, anteroseptal wall and apical Segment.  S/p TAVR   Neuro/Psych negative psych ROS   GI/Hepatic Neg liver ROS,   Endo/Other  Hypothyroidism   Renal/GU negative Renal ROS     Musculoskeletal   Abdominal (+) + obese,   Peds  Hematology negative hematology ROS (+)   Anesthesia Other Findings   Reproductive/Obstetrics                           Anesthesia Physical Anesthesia Plan  ASA: III  Anesthesia Plan: General   Post-op Pain Management: GA combined w/ Regional for post-op pain   Induction: Intravenous  PONV Risk Score and Plan: 3 and Treatment may vary due to age or medical condition, Ondansetron and Dexamethasone  Airway Management Planned: Oral ETT  Additional Equipment: None  Intra-op Plan:   Post-operative Plan: Extubation in OR  Informed Consent: I have reviewed the patients History and Physical,  chart, labs and discussed the procedure including the risks, benefits and alternatives for the proposed anesthesia with the patient or authorized representative who has indicated his/her understanding and acceptance.     Dental advisory given  Plan Discussed with:   Anesthesia Plan Comments: (Follows with pulmonology as well as allergy/asthma for mild persistent asthma as well as irritable larynx syndrome. Allergy shots managed by [name]. Pulm clearance 04/30/19: "Patient okay to proceed forward with surgery.  Patient is a low to moderate risk for pulmonary complications see below. Peri-operative Assessment of Pulmonary Risk for Non-Thoracic Surgery: For Justin [name], low to moderate risk of perioperative pulmonary complications is increased by: Age greater than 42 years, Asthma - stable, Chronic immunotherapy managed by Dr.Padgett."  Follows with cardiology for hx of CAD s/p CABG (cath 2018 with 4/4 grafts patent), AS s/p TAVR, combined HF, syncope, junctional bradycardia, PVCS, non-sustained VT and LBBB s/p Medtronic ICD. Device form has been faxed to EP clinic. Cardiology clearance 05/07/19: "Justin [name] was last seen on 02/08/2019 via virtual visit by Dr. Angelena Form.  Since that day, Justin [name] has done well. He was able to stack 150 boxes this past weekend without any exertional chest pain or shortness of breath. Recent echocardiogram shows stable ejection fraction (35-40% as compare to previous 40-45%). Since he is physically active without exertional symptom, he is cleared to proceed with surgery. His RCRI score is class III 6.6% perioperative risk for major cardiac event. Therefore, based on ACC/AHA guidelines, the patient would be at acceptable risk for the planned procedure without further cardiovascular testing."  TTE 04/29/19: LVEF 35-40%, moderate LVH, severe  anterior, apical and anteroseptal hypokinesis/dyskinesis, moderate RV systolic dysfunction, s/p AICD, mild biatrial  enlargement, s/p 29 mm Edwards Sapien 3 TAVR, no perivalvular leak - mean gradient 12 mmHg, dilated ascending aorta to 4.0 cm)    Anesthesia Quick Evaluation

## 2019-05-14 ENCOUNTER — Other Ambulatory Visit (HOSPITAL_COMMUNITY)
Admission: RE | Admit: 2019-05-14 | Discharge: 2019-05-14 | Disposition: A | Discharge status: Home / Self Care | Payer: Medicare Other | Source: Ambulatory Visit | Attending: Orthopedic Surgery | Admitting: Orthopedic Surgery

## 2019-05-14 DIAGNOSIS — M25561 Pain in right knee: Secondary | ICD-10-CM | POA: Diagnosis not present

## 2019-05-14 DIAGNOSIS — Z01812 Encounter for preprocedural laboratory examination: Secondary | ICD-10-CM | POA: Insufficient documentation

## 2019-05-14 DIAGNOSIS — Z20828 Contact with and (suspected) exposure to other viral communicable diseases: Secondary | ICD-10-CM | POA: Diagnosis not present

## 2019-05-15 LAB — NOVEL CORONAVIRUS, NAA (HOSP ORDER, SEND-OUT TO REF LAB; TAT 18-24 HRS): SARS-CoV-2, NAA: NOT DETECTED

## 2019-05-17 MED ORDER — DEXTROSE 5 % IV SOLN
3.0000 g | INTRAVENOUS | Status: AC
  Administered 2019-05-18: 3 g via INTRAVENOUS
  Filled 2019-05-17: qty 3

## 2019-05-18 ENCOUNTER — Inpatient Hospital Stay (HOSPITAL_COMMUNITY): Payer: Medicare Other | Admitting: Physician Assistant

## 2019-05-18 ENCOUNTER — Inpatient Hospital Stay (HOSPITAL_COMMUNITY): Payer: Medicare Other | Admitting: Anesthesiology

## 2019-05-18 ENCOUNTER — Encounter (HOSPITAL_COMMUNITY)
Admission: RE | Disposition: A | Discharge status: Home Health | Payer: Self-pay | Source: Home / Self Care | Attending: Orthopedic Surgery

## 2019-05-18 ENCOUNTER — Encounter (HOSPITAL_COMMUNITY): Payer: Self-pay

## 2019-05-18 ENCOUNTER — Inpatient Hospital Stay (HOSPITAL_COMMUNITY)
Admission: RE | Admit: 2019-05-18 | Discharge: 2019-05-19 | DRG: 464 | Disposition: A | Discharge status: Home Health | Payer: Medicare Other | Attending: Orthopedic Surgery | Admitting: Orthopedic Surgery

## 2019-05-18 ENCOUNTER — Other Ambulatory Visit: Payer: Self-pay

## 2019-05-18 DIAGNOSIS — Z96651 Presence of right artificial knee joint: Secondary | ICD-10-CM

## 2019-05-18 DIAGNOSIS — Z803 Family history of malignant neoplasm of breast: Secondary | ICD-10-CM

## 2019-05-18 DIAGNOSIS — T84032A Mechanical loosening of internal right knee prosthetic joint, initial encounter: Secondary | ICD-10-CM | POA: Diagnosis not present

## 2019-05-18 DIAGNOSIS — K219 Gastro-esophageal reflux disease without esophagitis: Secondary | ICD-10-CM | POA: Diagnosis present

## 2019-05-18 DIAGNOSIS — M1990 Unspecified osteoarthritis, unspecified site: Secondary | ICD-10-CM | POA: Diagnosis not present

## 2019-05-18 DIAGNOSIS — I5043 Acute on chronic combined systolic (congestive) and diastolic (congestive) heart failure: Secondary | ICD-10-CM | POA: Diagnosis not present

## 2019-05-18 DIAGNOSIS — Z8049 Family history of malignant neoplasm of other genital organs: Secondary | ICD-10-CM

## 2019-05-18 DIAGNOSIS — E039 Hypothyroidism, unspecified: Secondary | ICD-10-CM | POA: Diagnosis present

## 2019-05-18 DIAGNOSIS — I428 Other cardiomyopathies: Secondary | ICD-10-CM | POA: Diagnosis present

## 2019-05-18 DIAGNOSIS — Z6841 Body Mass Index (BMI) 40.0 and over, adult: Secondary | ICD-10-CM

## 2019-05-18 DIAGNOSIS — Z8249 Family history of ischemic heart disease and other diseases of the circulatory system: Secondary | ICD-10-CM

## 2019-05-18 DIAGNOSIS — I110 Hypertensive heart disease with heart failure: Secondary | ICD-10-CM | POA: Diagnosis not present

## 2019-05-18 DIAGNOSIS — Z951 Presence of aortocoronary bypass graft: Secondary | ICD-10-CM | POA: Diagnosis not present

## 2019-05-18 DIAGNOSIS — T84038A Mechanical loosening of other internal prosthetic joint, initial encounter: Secondary | ICD-10-CM | POA: Diagnosis not present

## 2019-05-18 DIAGNOSIS — I252 Old myocardial infarction: Secondary | ICD-10-CM

## 2019-05-18 DIAGNOSIS — Z825 Family history of asthma and other chronic lower respiratory diseases: Secondary | ICD-10-CM | POA: Diagnosis not present

## 2019-05-18 DIAGNOSIS — M109 Gout, unspecified: Secondary | ICD-10-CM | POA: Diagnosis present

## 2019-05-18 DIAGNOSIS — I2510 Atherosclerotic heart disease of native coronary artery without angina pectoris: Secondary | ICD-10-CM | POA: Diagnosis not present

## 2019-05-18 DIAGNOSIS — E785 Hyperlipidemia, unspecified: Secondary | ICD-10-CM | POA: Diagnosis present

## 2019-05-18 DIAGNOSIS — M1711 Unilateral primary osteoarthritis, right knee: Secondary | ICD-10-CM | POA: Diagnosis present

## 2019-05-18 DIAGNOSIS — Z8546 Personal history of malignant neoplasm of prostate: Secondary | ICD-10-CM

## 2019-05-18 DIAGNOSIS — Z91010 Allergy to peanuts: Secondary | ICD-10-CM

## 2019-05-18 DIAGNOSIS — I5042 Chronic combined systolic (congestive) and diastolic (congestive) heart failure: Secondary | ICD-10-CM | POA: Diagnosis present

## 2019-05-18 DIAGNOSIS — Z87891 Personal history of nicotine dependence: Secondary | ICD-10-CM | POA: Diagnosis not present

## 2019-05-18 DIAGNOSIS — Z888 Allergy status to other drugs, medicaments and biological substances status: Secondary | ICD-10-CM | POA: Diagnosis not present

## 2019-05-18 DIAGNOSIS — N400 Enlarged prostate without lower urinary tract symptoms: Secondary | ICD-10-CM | POA: Diagnosis present

## 2019-05-18 DIAGNOSIS — T84038D Mechanical loosening of other internal prosthetic joint, subsequent encounter: Secondary | ICD-10-CM | POA: Diagnosis not present

## 2019-05-18 DIAGNOSIS — Z952 Presence of prosthetic heart valve: Secondary | ICD-10-CM

## 2019-05-18 DIAGNOSIS — G8918 Other acute postprocedural pain: Secondary | ICD-10-CM | POA: Diagnosis not present

## 2019-05-18 DIAGNOSIS — Z20828 Contact with and (suspected) exposure to other viral communicable diseases: Secondary | ICD-10-CM | POA: Diagnosis present

## 2019-05-18 DIAGNOSIS — Z882 Allergy status to sulfonamides status: Secondary | ICD-10-CM | POA: Diagnosis not present

## 2019-05-18 DIAGNOSIS — Z95810 Presence of automatic (implantable) cardiac defibrillator: Secondary | ICD-10-CM

## 2019-05-18 DIAGNOSIS — E669 Obesity, unspecified: Secondary | ICD-10-CM | POA: Diagnosis present

## 2019-05-18 DIAGNOSIS — T84032D Mechanical loosening of internal right knee prosthetic joint, subsequent encounter: Secondary | ICD-10-CM | POA: Diagnosis not present

## 2019-05-18 HISTORY — PX: TOTAL KNEE REVISION: SHX996

## 2019-05-18 LAB — APTT: aPTT: 33 seconds (ref 24–36)

## 2019-05-18 SURGERY — TOTAL KNEE REVISION
Anesthesia: General | Laterality: Right

## 2019-05-18 MED ORDER — NAPROXEN 250 MG PO TABS
250.0000 mg | ORAL_TABLET | Freq: Two times a day (BID) | ORAL | Status: DC
  Administered 2019-05-19: 10:00:00 250 mg via ORAL
  Filled 2019-05-18 (×2): qty 1

## 2019-05-18 MED ORDER — NON FORMULARY
Status: DC | PRN
  Administered 2019-05-18: 450 mL

## 2019-05-18 MED ORDER — METOCLOPRAMIDE HCL 5 MG/ML IJ SOLN: 5.0000 mg | Freq: Three times a day (TID) | INTRAMUSCULAR | Status: DC | PRN

## 2019-05-18 MED ORDER — ONDANSETRON HCL 4 MG/2ML IJ SOLN: 4.0000 mg | Freq: Once | INTRAMUSCULAR | Status: DC | PRN

## 2019-05-18 MED ORDER — CLONIDINE HCL (ANALGESIA) 100 MCG/ML EP SOLN
EPIDURAL | Status: DC | PRN
  Administered 2019-05-18: 100 ug

## 2019-05-18 MED ORDER — MENTHOL 3 MG MT LOZG
1.0000 | LOZENGE | OROMUCOSAL | Status: DC | PRN
  Filled 2019-05-18: qty 9

## 2019-05-18 MED ORDER — LEVOTHYROXINE SODIUM 100 MCG PO TABS
100.0000 ug | ORAL_TABLET | Freq: Every day | ORAL | Status: DC
  Administered 2019-05-19: 100 ug via ORAL
  Filled 2019-05-18: qty 1

## 2019-05-18 MED ORDER — TRANEXAMIC ACID-NACL 1000-0.7 MG/100ML-% IV SOLN
INTRAVENOUS | Status: AC
  Filled 2019-05-18: qty 100

## 2019-05-18 MED ORDER — ROPIVACAINE HCL 5 MG/ML IJ SOLN
INTRAMUSCULAR | Status: DC | PRN
  Administered 2019-05-18: 30 mL via PERINEURAL

## 2019-05-18 MED ORDER — OXYCODONE HCL 5 MG PO TABS
5.0000 mg | ORAL_TABLET | ORAL | Status: DC | PRN
  Administered 2019-05-18 – 2019-05-19 (×3): 10 mg via ORAL
  Filled 2019-05-18 (×3): qty 2

## 2019-05-18 MED ORDER — ONDANSETRON HCL 4 MG/2ML IJ SOLN
INTRAMUSCULAR | Status: AC
  Filled 2019-05-18: qty 2

## 2019-05-18 MED ORDER — ACETAMINOPHEN 10 MG/ML IV SOLN
1000.0000 mg | Freq: Once | INTRAVENOUS | Status: DC | PRN
  Administered 2019-05-18: 1000 mg via INTRAVENOUS

## 2019-05-18 MED ORDER — ROCURONIUM BROMIDE 10 MG/ML (PF) SYRINGE
PREFILLED_SYRINGE | INTRAVENOUS | Status: DC | PRN
  Administered 2019-05-18: 50 mg via INTRAVENOUS

## 2019-05-18 MED ORDER — TRANEXAMIC ACID-NACL 1000-0.7 MG/100ML-% IV SOLN
INTRAVENOUS | Status: DC | PRN
  Administered 2019-05-18: 1000 mg via INTRAVENOUS

## 2019-05-18 MED ORDER — ONDANSETRON HCL 4 MG/2ML IJ SOLN
INTRAMUSCULAR | Status: DC | PRN
  Administered 2019-05-18: 4 mg via INTRAVENOUS

## 2019-05-18 MED ORDER — SUCCINYLCHOLINE CHLORIDE 20 MG/ML IJ SOLN
INTRAMUSCULAR | Status: DC | PRN
  Administered 2019-05-18: 100 mg via INTRAVENOUS

## 2019-05-18 MED ORDER — DEXAMETHASONE SODIUM PHOSPHATE 10 MG/ML IJ SOLN
INTRAMUSCULAR | Status: AC
  Filled 2019-05-18: qty 1

## 2019-05-18 MED ORDER — POVIDONE-IODINE 10 % EX SWAB
2.0000 "application " | Freq: Once | CUTANEOUS | Status: AC
  Administered 2019-05-18: 2 via TOPICAL

## 2019-05-18 MED ORDER — METHOCARBAMOL 1000 MG/10ML IJ SOLN
500.0000 mg | Freq: Four times a day (QID) | INTRAVENOUS | Status: DC | PRN
  Filled 2019-05-18: qty 5

## 2019-05-18 MED ORDER — BUPIVACAINE LIPOSOME 1.3 % IJ SUSP
20.0000 mL | INTRAMUSCULAR | Status: AC
  Administered 2019-05-18: 20 mL
  Filled 2019-05-18: qty 20

## 2019-05-18 MED ORDER — SUGAMMADEX SODIUM 200 MG/2ML IV SOLN
INTRAVENOUS | Status: DC | PRN
  Administered 2019-05-18: 200 mg via INTRAVENOUS

## 2019-05-18 MED ORDER — METHOCARBAMOL 500 MG PO TABS
500.0000 mg | ORAL_TABLET | Freq: Four times a day (QID) | ORAL | Status: DC | PRN
  Administered 2019-05-19: 500 mg via ORAL
  Filled 2019-05-18: qty 1

## 2019-05-18 MED ORDER — LACTATED RINGERS IV SOLN
INTRAVENOUS | Status: DC
  Administered 2019-05-18: 19:00:00 via INTRAVENOUS

## 2019-05-18 MED ORDER — ROCURONIUM BROMIDE 10 MG/ML (PF) SYRINGE
PREFILLED_SYRINGE | INTRAVENOUS | Status: AC
  Filled 2019-05-18: qty 10

## 2019-05-18 MED ORDER — PROPOFOL 10 MG/ML IV BOLUS
INTRAVENOUS | Status: AC
  Filled 2019-05-18: qty 40

## 2019-05-18 MED ORDER — ONDANSETRON HCL 4 MG/2ML IJ SOLN: 4.0000 mg | Freq: Four times a day (QID) | INTRAMUSCULAR | Status: DC | PRN

## 2019-05-18 MED ORDER — VANCOMYCIN HCL 1000 MG IV SOLR
INTRAVENOUS | Status: DC | PRN
  Administered 2019-05-18: 1000 mg

## 2019-05-18 MED ORDER — ACETAMINOPHEN 10 MG/ML IV SOLN
INTRAVENOUS | Status: AC
  Filled 2019-05-18: qty 100

## 2019-05-18 MED ORDER — HYDROMORPHONE HCL 1 MG/ML IJ SOLN: 0.5000 mg | INTRAMUSCULAR | Status: DC | PRN

## 2019-05-18 MED ORDER — FENTANYL CITRATE (PF) 100 MCG/2ML IJ SOLN
INTRAMUSCULAR | Status: DC | PRN
  Administered 2019-05-18 (×2): 50 ug via INTRAVENOUS
  Administered 2019-05-18 (×2): 25 ug via INTRAVENOUS
  Administered 2019-05-18: 50 ug via INTRAVENOUS

## 2019-05-18 MED ORDER — LIDOCAINE 2% (20 MG/ML) 5 ML SYRINGE
INTRAMUSCULAR | Status: DC | PRN
  Administered 2019-05-18: 100 mg via INTRAVENOUS

## 2019-05-18 MED ORDER — SUCCINYLCHOLINE CHLORIDE 200 MG/10ML IV SOSY
PREFILLED_SYRINGE | INTRAVENOUS | Status: AC
  Filled 2019-05-18: qty 10

## 2019-05-18 MED ORDER — LIDOCAINE 2% (20 MG/ML) 5 ML SYRINGE
INTRAMUSCULAR | Status: AC
  Filled 2019-05-18: qty 5

## 2019-05-18 MED ORDER — BUPIVACAINE-EPINEPHRINE 0.5% -1:200000 IJ SOLN
INTRAMUSCULAR | Status: DC | PRN
  Administered 2019-05-18: 40 mL

## 2019-05-18 MED ORDER — CLONIDINE HCL (ANALGESIA) 100 MCG/ML EP SOLN
EPIDURAL | Status: DC | PRN
  Administered 2019-05-18: 1 mL

## 2019-05-18 MED ORDER — LACTATED RINGERS IV SOLN
INTRAVENOUS | Status: DC | PRN
  Administered 2019-05-18: 07:00:00 via INTRAVENOUS

## 2019-05-18 MED ORDER — MORPHINE SULFATE (PF) 4 MG/ML IV SOLN
INTRAVENOUS | Status: AC
  Filled 2019-05-18: qty 2

## 2019-05-18 MED ORDER — CHLORHEXIDINE GLUCONATE 4 % EX LIQD: 60.0000 mL | Freq: Once | CUTANEOUS | Status: DC

## 2019-05-18 MED ORDER — CEFAZOLIN SODIUM-DEXTROSE 2-4 GM/100ML-% IV SOLN
2.0000 g | Freq: Four times a day (QID) | INTRAVENOUS | Status: AC
  Administered 2019-05-18 – 2019-05-19 (×2): 2 g via INTRAVENOUS
  Filled 2019-05-18 (×2): qty 100

## 2019-05-18 MED ORDER — DABIGATRAN ETEXILATE MESYLATE 150 MG PO CAPS
150.0000 mg | ORAL_CAPSULE | Freq: Two times a day (BID) | ORAL | Status: DC
  Administered 2019-05-18 – 2019-05-19 (×2): 150 mg via ORAL
  Filled 2019-05-18 (×3): qty 1

## 2019-05-18 MED ORDER — PANTOPRAZOLE SODIUM 40 MG PO TBEC
40.0000 mg | DELAYED_RELEASE_TABLET | Freq: Every day | ORAL | Status: DC
  Administered 2019-05-19: 40 mg via ORAL
  Filled 2019-05-18: qty 1

## 2019-05-18 MED ORDER — EPHEDRINE SULFATE-NACL 50-0.9 MG/10ML-% IV SOSY
PREFILLED_SYRINGE | INTRAVENOUS | Status: DC | PRN
  Administered 2019-05-18 (×2): 10 mg via INTRAVENOUS

## 2019-05-18 MED ORDER — FENTANYL CITRATE (PF) 100 MCG/2ML IJ SOLN
25.0000 ug | INTRAMUSCULAR | Status: DC | PRN
  Administered 2019-05-18: 25 ug via INTRAVENOUS

## 2019-05-18 MED ORDER — BUPIVACAINE-EPINEPHRINE (PF) 0.5% -1:200000 IJ SOLN
INTRAMUSCULAR | Status: AC
  Filled 2019-05-18: qty 30

## 2019-05-18 MED ORDER — DOCUSATE SODIUM 100 MG PO CAPS
100.0000 mg | ORAL_CAPSULE | Freq: Two times a day (BID) | ORAL | Status: DC
  Administered 2019-05-18 – 2019-05-19 (×2): 100 mg via ORAL
  Filled 2019-05-18 (×2): qty 1

## 2019-05-18 MED ORDER — METOCLOPRAMIDE HCL 5 MG PO TABS: 5.0000 mg | ORAL_TABLET | Freq: Three times a day (TID) | ORAL | Status: DC | PRN

## 2019-05-18 MED ORDER — PROPOFOL 10 MG/ML IV BOLUS
INTRAVENOUS | Status: DC | PRN
  Administered 2019-05-18: 100 mg via INTRAVENOUS
  Administered 2019-05-18: 20 mg via INTRAVENOUS
  Administered 2019-05-18: 50 mg via INTRAVENOUS

## 2019-05-18 MED ORDER — TRANEXAMIC ACID 1000 MG/10ML IV SOLN
2000.0000 mg | INTRAVENOUS | Status: AC
  Administered 2019-05-18: 09:00:00 2000 mg via TOPICAL
  Filled 2019-05-18: qty 20

## 2019-05-18 MED ORDER — VANCOMYCIN HCL 1000 MG IV SOLR
INTRAVENOUS | Status: AC
  Filled 2019-05-18: qty 1000

## 2019-05-18 MED ORDER — DEXAMETHASONE SODIUM PHOSPHATE 4 MG/ML IJ SOLN
INTRAMUSCULAR | Status: DC | PRN
  Administered 2019-05-18: 10 mg via INTRAVENOUS

## 2019-05-18 MED ORDER — FENTANYL CITRATE (PF) 100 MCG/2ML IJ SOLN
INTRAMUSCULAR | Status: AC
  Filled 2019-05-18: qty 2

## 2019-05-18 MED ORDER — ATORVASTATIN CALCIUM 10 MG PO TABS
20.0000 mg | ORAL_TABLET | Freq: Every day | ORAL | Status: DC
  Administered 2019-05-18: 20 mg via ORAL
  Filled 2019-05-18: qty 2

## 2019-05-18 MED ORDER — BUPIVACAINE-EPINEPHRINE 0.25% -1:200000 IJ SOLN
INTRAMUSCULAR | Status: AC
  Filled 2019-05-18: qty 1

## 2019-05-18 MED ORDER — PHENYLEPHRINE 40 MCG/ML (10ML) SYRINGE FOR IV PUSH (FOR BLOOD PRESSURE SUPPORT)
PREFILLED_SYRINGE | INTRAVENOUS | Status: AC
  Filled 2019-05-18: qty 10

## 2019-05-18 MED ORDER — ACETAMINOPHEN 325 MG PO TABS: 325.0000 mg | ORAL_TABLET | Freq: Four times a day (QID) | ORAL | Status: DC | PRN

## 2019-05-18 MED ORDER — 0.9 % SODIUM CHLORIDE (POUR BTL) OPTIME
TOPICAL | Status: DC | PRN
  Administered 2019-05-18 (×5): 1000 mL

## 2019-05-18 MED ORDER — MORPHINE SULFATE 4 MG/ML IJ SOLN
INTRAMUSCULAR | Status: DC | PRN
  Administered 2019-05-18: 8 mg

## 2019-05-18 MED ORDER — PHENYLEPHRINE 40 MCG/ML (10ML) SYRINGE FOR IV PUSH (FOR BLOOD PRESSURE SUPPORT)
PREFILLED_SYRINGE | INTRAVENOUS | Status: DC | PRN
  Administered 2019-05-18 (×4): 80 ug via INTRAVENOUS

## 2019-05-18 MED ORDER — PHENOL 1.4 % MT LIQD
1.0000 | OROMUCOSAL | Status: DC | PRN
  Administered 2019-05-18: 1 via OROMUCOSAL

## 2019-05-18 MED ORDER — CLONIDINE HCL (ANALGESIA) 100 MCG/ML EP SOLN
EPIDURAL | Status: AC
  Filled 2019-05-18: qty 10

## 2019-05-18 MED ORDER — FENTANYL CITRATE (PF) 250 MCG/5ML IJ SOLN
INTRAMUSCULAR | Status: AC
  Filled 2019-05-18: qty 5

## 2019-05-18 MED ORDER — ONDANSETRON HCL 4 MG PO TABS: 4.0000 mg | ORAL_TABLET | Freq: Four times a day (QID) | ORAL | Status: DC | PRN

## 2019-05-18 SURGICAL SUPPLY — 66 items
BAG DECANTER FOR FLEXI CONT (MISCELLANEOUS) ×3 IMPLANT
BANDAGE ESMARK 6X9 LF (GAUZE/BANDAGES/DRESSINGS) ×1 IMPLANT
BIT DRILL 5/64X5 DISP (BIT) ×3 IMPLANT
BIT DRILL 7/64X5 DISP (BIT) ×3 IMPLANT
BLADE SAW SGTL 13.0X1.19X90.0M (BLADE) ×3 IMPLANT
BLADE SURG 10 STRL SS (BLADE) ×3 IMPLANT
BNDG COHESIVE 6X5 TAN STRL LF (GAUZE/BANDAGES/DRESSINGS) ×3 IMPLANT
BNDG ELASTIC 4X5.8 VLCR STR LF (GAUZE/BANDAGES/DRESSINGS) ×3 IMPLANT
BNDG ELASTIC 6X10 VLCR STRL LF (GAUZE/BANDAGES/DRESSINGS) ×9 IMPLANT
BNDG ELASTIC 6X15 VLCR STRL LF (GAUZE/BANDAGES/DRESSINGS) ×3 IMPLANT
BNDG ESMARK 6X9 LF (GAUZE/BANDAGES/DRESSINGS) ×3
BOWL SMART MIX CTS (DISPOSABLE) ×3 IMPLANT
CEMENT BONE SIMPLEX SPEEDSET (Cement) ×3 IMPLANT
COVER SURGICAL LIGHT HANDLE (MISCELLANEOUS) ×3 IMPLANT
CUFF TOURN SGL QUICK 34 (TOURNIQUET CUFF) ×2
CUFF TRNQT CYL 34X4.125X (TOURNIQUET CUFF) ×1 IMPLANT
DECANTER SPIKE VIAL GLASS SM (MISCELLANEOUS) ×3 IMPLANT
DRAPE INCISE IOBAN 66X45 STRL (DRAPES) IMPLANT
DRAPE ORTHO SPLIT 77X108 STRL (DRAPES) ×6
DRAPE SURG ORHT 6 SPLT 77X108 (DRAPES) ×3 IMPLANT
DRAPE U-SHAPE 47X51 STRL (DRAPES) ×3 IMPLANT
DRSG AQUACEL AG ADV 3.5X 6 (GAUZE/BANDAGES/DRESSINGS) ×3 IMPLANT
DRSG AQUACEL AG ADV 3.5X10 (GAUZE/BANDAGES/DRESSINGS) ×6 IMPLANT
DURAPREP 26ML APPLICATOR (WOUND CARE) ×6 IMPLANT
ELECT REM PT RETURN 9FT ADLT (ELECTROSURGICAL) ×3
ELECTRODE REM PT RTRN 9FT ADLT (ELECTROSURGICAL) ×1 IMPLANT
GAUZE SPONGE 4X4 12PLY STRL (GAUZE/BANDAGES/DRESSINGS) ×3 IMPLANT
GLOVE BIOGEL PI IND STRL 7.5 (GLOVE) IMPLANT
GLOVE BIOGEL PI IND STRL 8 (GLOVE) ×1 IMPLANT
GLOVE BIOGEL PI INDICATOR 7.5 (GLOVE)
GLOVE BIOGEL PI INDICATOR 8 (GLOVE) ×2
GLOVE ECLIPSE 7.0 STRL STRAW (GLOVE) IMPLANT
GLOVE SURG ORTHO 8.0 STRL STRW (GLOVE) ×3 IMPLANT
GOWN STRL REUS W/ TWL LRG LVL3 (GOWN DISPOSABLE) ×3 IMPLANT
GOWN STRL REUS W/ TWL XL LVL3 (GOWN DISPOSABLE) ×1 IMPLANT
GOWN STRL REUS W/TWL LRG LVL3 (GOWN DISPOSABLE) ×6
GOWN STRL REUS W/TWL XL LVL3 (GOWN DISPOSABLE) ×2
HOOD PEEL AWAY FLYTE STAYCOOL (MISCELLANEOUS) ×9 IMPLANT
IMMOBILIZER KNEE 22 UNIV (SOFTGOODS) ×3 IMPLANT
JET LAVAGE IRRISEPT WOUND (IRRIGATION / IRRIGATOR) ×3
KIT BASIN OR (CUSTOM PROCEDURE TRAY) ×3 IMPLANT
KIT TURNOVER KIT B (KITS) ×3 IMPLANT
LAVAGE JET IRRISEPT WOUND (IRRIGATION / IRRIGATOR) ×1 IMPLANT
MANIFOLD NEPTUNE II (INSTRUMENTS) ×3 IMPLANT
NEEDLE 22X1 1/2 (OR ONLY) (NEEDLE) ×6 IMPLANT
NS IRRIG 1000ML POUR BTL (IV SOLUTION) ×3 IMPLANT
PACK TOTAL JOINT (CUSTOM PROCEDURE TRAY) ×3 IMPLANT
PAD ARMBOARD 7.5X6 YLW CONV (MISCELLANEOUS) ×6 IMPLANT
PAD CAST 4YDX4 CTTN HI CHSV (CAST SUPPLIES) ×1 IMPLANT
PADDING CAST COTTON 4X4 STRL (CAST SUPPLIES) ×2
PADDING CAST COTTON 6X4 STRL (CAST SUPPLIES) ×3 IMPLANT
PATELLA DOME PFC 41MM (Knees) ×3 IMPLANT
SUCTION FRAZIER HANDLE 10FR (MISCELLANEOUS) ×2
SUCTION TUBE FRAZIER 10FR DISP (MISCELLANEOUS) ×1 IMPLANT
SUT MNCRL AB 3-0 PS2 27 (SUTURE) ×6 IMPLANT
SUT VIC AB 0 CT1 27 (SUTURE) ×6
SUT VIC AB 0 CT1 27XBRD ANBCTR (SUTURE) ×3 IMPLANT
SUT VIC AB 1 CT1 27 (SUTURE) ×6
SUT VIC AB 1 CT1 27XBRD ANBCTR (SUTURE) ×3 IMPLANT
SUT VIC AB 2-0 CT1 27 (SUTURE) ×6
SUT VIC AB 2-0 CT1 TAPERPNT 27 (SUTURE) ×3 IMPLANT
SWAB COLLECTION DEVICE MRSA (MISCELLANEOUS) ×3 IMPLANT
SWAB CULTURE LIQ STUART DBL (MISCELLANEOUS) ×3 IMPLANT
SYR 30ML LL (SYRINGE) ×9 IMPLANT
TOWEL GREEN STERILE (TOWEL DISPOSABLE) ×6 IMPLANT
WATER STERILE IRR 1000ML POUR (IV SOLUTION) ×9 IMPLANT

## 2019-05-18 NOTE — Anesthesia Procedure Notes (Signed)
Procedure Name: Intubation Date/Time: 05/18/2019 7:29 AM Performed by: Lieutenant Diego, CRNA Pre-anesthesia Checklist: Patient identified, Emergency Drugs available, Suction available and Patient being monitored Patient Re-evaluated:Patient Re-evaluated prior to induction Oxygen Delivery Method: Circle system utilized Preoxygenation: Pre-oxygenation with 100% oxygen Induction Type: IV induction Ventilation: Mask ventilation without difficulty Laryngoscope Size: Miller and 2 Grade View: Grade I Tube type: Oral Number of attempts: 1 Airway Equipment and Method: Stylet Placement Confirmation: ETT inserted through vocal cords under direct vision,  positive ETCO2 and breath sounds checked- equal and bilateral Secured at: 22 cm Tube secured with: Tape Dental Injury: Teeth and Oropharynx as per pre-operative assessment

## 2019-05-18 NOTE — Anesthesia Postprocedure Evaluation (Signed)
Anesthesia Post Note  Patient: Justin [name]  Procedure(s) Performed: RIGHT PATELLA REVISION/REMOVAL (Right )     Patient location during evaluation: PACU Anesthesia Type: General Level of consciousness: awake and alert Pain management: pain level controlled Vital Signs Assessment: post-procedure vital signs reviewed and stable Respiratory status: spontaneous breathing, nonlabored ventilation, respiratory function stable and patient connected to nasal cannula oxygen Cardiovascular status: blood pressure returned to baseline and stable Postop Assessment: no apparent nausea or vomiting Anesthetic complications: no    Last Vitals:  Vitals:   05/18/19 1042 05/18/19 1047  BP: (!) 100/50 (!) 104/51  Pulse: 67 76  Resp: 19 17  Temp:    SpO2: 96% 98%    Last Pain:  Vitals:   05/18/19 1047  TempSrc:   PainSc: 2                  Barnet Glasgow

## 2019-05-18 NOTE — Progress Notes (Signed)
Orthopedic Tech Progress Note Patient Details:  Justin [name] 10-05-1933 [id number]  CPM Right Knee CPM Right Knee: On Right Knee Flexion (Degrees): 10 Right Knee Extension (Degrees): 40 Additional Comments: added ice  Post Interventions Patient Tolerated: Well Instructions Provided: Care of device, Adjustment of device Ortho Devices Type of Ortho Device: Bone foam zero knee   Post Interventions Patient Tolerated: Well Instructions Provided: Care of device, Adjustment of device   Janit Pagan 05/18/2019, 2:46 PM

## 2019-05-18 NOTE — Brief Op Note (Signed)
   05/18/2019  10:12 AM  PATIENT:  Justin [name]  85 y.o. male  PRE-OPERATIVE DIAGNOSIS:  RIGHT PATELLA LOOSENING  POST-OPERATIVE DIAGNOSIS:  RIGHT PATELLA LOOSENING  PROCEDURE:  Procedure(s): RIGHT PATELLA REVISION/REMOVAL  SURGEON:  Surgeon(s): Marlou Sa, Tonna Corner, MD  ASSISTANT: magnant pa  ANESTHESIA:   general  EBL: 25 ml    Total I/O In: 800 [I.V.:800] Out: 25 [Blood:25]  BLOOD ADMINISTERED: none  DRAINS: none   LOCAL MEDICATIONS USED:  [id number]  SPECIMEN:  No Specimen  COUNTS:  YES  TOURNIQUET:   Total Tourniquet Time Documented: Thigh (Right) - 49 minutes Thigh (Right) - 19 minutes Total: Thigh (Right) - 68 minutes   DICTATION: .Other Dictation: Dictation Number [id number]  PLAN OF CARE: Admit to inpatient   PATIENT DISPOSITION:  PACU - hemodynamically stable

## 2019-05-18 NOTE — Transfer of Care (Signed)
Immediate Anesthesia Transfer of Care Note  Patient: Justin [name]  Procedure(s) Performed: RIGHT PATELLA REVISION/REMOVAL (Right )  Patient Location: PACU  Anesthesia Type:General  Level of Consciousness: awake and alert   Airway & Oxygen Therapy: Patient Spontanous Breathing and Patient connected to face mask oxygen  Post-op Assessment: Report given to RN and Post -op Vital signs reviewed and stable  Post vital signs: Reviewed and stable  Last Vitals:  Vitals Value Taken Time  BP    Temp    Pulse    Resp    SpO2      Last Pain:  Vitals:   05/18/19 0615  TempSrc: Oral  PainSc:          Complications: No apparent anesthesia complications

## 2019-05-18 NOTE — Anesthesia Procedure Notes (Signed)
Anesthesia Regional Block: Adductor canal block   Pre-Anesthetic Checklist: ,, timeout performed, Correct Patient, Correct Site, Correct Laterality, Correct Procedure, Correct Position, site marked, Risks and benefits discussed,  Surgical consent,  Pre-op evaluation,  At surgeon's request and post-op pain management  Laterality: Lower and Right  Prep: chloraprep       Needles:  Injection technique: Single-shot  Needle Type: Echogenic Needle     Needle Length: 9cm  Needle Gauge: 22     Additional Needles:   Procedures:,,,, ultrasound used (permanent image in chart),,,,  Narrative:  Start time: 05/18/2019 7:08 AM End time: 05/18/2019 7:12 AM Injection made incrementally with aspirations every 5 mL.  Performed by: Personally  Anesthesiologist: Barnet Glasgow, MD  Additional Notes: Block assessed prior to surgery. Pt tolerated procedure well.

## 2019-05-18 NOTE — H&P (Signed)
TOTAL KNEE REVISION ADMISSION H&P  Patient is being admitted for right revision total knee arthroplasty.  Subjective:  Chief Complaint:right knee pain.  HPI: Justin [name], 85 y.o. male, has a history of pain and functional disability in the right knee(s) due to arthritis and patient has failed non-surgical conservative treatments for greater than 12 weeks to include flexibility and strengthening excercises, use of assistive devices and activity modification. The indications for the revision of the total knee arthroplasty are loosening of one or more components. Onset of symptoms was gradual starting 2 years ago with gradually worsening course since that time.  Prior procedures on the right knee(s) include arthroplasty.  Patient currently rates pain in the right knee(s) at 6 out of 10 with activity. There is Pain that is worsened primarily by going up and down steps.  Flat ground walking generally is fine..  Patient has evidence of prosthetic loosening by imaging studies. This condition presents safety issues increasing the risk of falls. This patient has had Bone scan which shows stable components on the tibial and femoral side but loosening of the patellar component based on sequential radiographs..  There is no current active infection.  Patient Active Problem List   Diagnosis Date Noted  . Surgical counseling visit 04/30/2019  . Cough 04/30/2019  . Gastroesophageal reflux disease 04/30/2019  . Bilateral impacted cerumen 04/30/2019  . S/P ICD (internal cardiac defibrillator) procedure 05/03/2017  . NICM (nonischemic cardiomyopathy) ([place]) 05/02/2017  . 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 Severe aortic valve stenosis 03/04/2017  . Hypokalemia due to loss of potassium with diuresis 01/10/2017  . Aortic stenosis 01/10/2017  . Systolic heart failure ([place]) 01/10/2017  . Acute on chronic diastolic CHF (congestive heart failure) ([place]) 01/07/2017  . Hypothyroidism  12/23/2016  . Prostate cancer ([place])   . 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 Edema of both legs 09/01/2014  . Asthma, intermittent 09/01/2014  . Aortic valve disorders 01/01/2011  . Coronary artery disease involving native coronary artery of native heart without angina pectoris   . ACUTE BRONCHITIS 09/06/2010  . GOUT 09/12/2009  . CELLULITIS, FOOT 08/15/2009  . CAD, AUTOLOGOUS BYPASS GRAFT 05/10/2009  . MURMUR 05/08/2009  . BRUIT 05/08/2009  . MUSCLE CRAMPS 12/12/2008  . KNEE SPRAIN 09/21/2008  . BENIGN PROSTATIC HYPERTROPHY 07/07/2008  . Osteoarthritis 07/07/2008  . CONTUSION, HIP 05/13/2008  . BENIGN PROSTATIC HYPERTROPHY, WITH OBSTRUCTION 02/01/2008  . LOW BACK PAIN SYNDROME 02/01/2008  . TESTOSTERONE DEFICIENCY 07/03/2007  . HYPERLIPIDEMIA 07/03/2007  . Essential hypertension 07/03/2007  . MYOCARDIAL INFARCTION, HX OF 07/03/2007  . Allergic rhinitis 07/03/2007  . ACTINIC KERATOSIS 07/03/2007  . COLONIC POLYPS, HX OF 07/03/2007  . ACNE ROSACEA, HX OF 07/03/2007  . 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   Past Medical History:  Diagnosis Date  . Age-related macular degeneration, dry, both eyes   . Allergic    "24/7; 365 days/year; I'm allergic to pollens, dust, all southern grasses/trees, mold, mildue, cats, dogs" (01/07/2017)  . Anal fissure   . Asthma    sees [name]   . Benign prostatic hypertrophy    (sees Dr. Denman George  . CAD (coronary artery disease)    a. s/p CABG 2007. b. Cath 08/2016 - 4/4 patent grafts.  . Carotid bruit    carotid u/s 10/10: 0.39% bilaterally  . Chronic combined systolic and diastolic CHF (congestive heart failure) ([place])   . Complication of anesthesia 1980s   "w/anal cyst OR, he gave me a saddle block  then put a narcotic in spinal cord; had a severe reaction to that" (01/07/2017)  . Congestive heart failure (CHF) ([place])   . ED (erectile dysfunction)   . Family history of adverse reaction to anesthesia    "daughter wakes up during OR" (01/07/2017)  .  GERD (gastroesophageal reflux disease)   . Gout   . HTN (hypertension)   . Hx of colonic polyps    (sees Dr. Henrene Pastor)  . Hyperlipidemia   . Hypothyroidism   . Moderate to severe aortic stenosis    a. s/p TAVR 02/2017.  [name] Myocardial infarction ([place]) ~ 2000  . Obesity   . Osteoarthritis    "was in my knees, hands" (01/07/2017 )  . PAF (paroxysmal atrial fibrillation) ([place])    a. documented post TAVR.  [name] Precancerous skin lesion    (sees Dr. Allyson Sabal)  . Prostate cancer ([place]) dx'd ~ 2014  . 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 ANUS SURGERY     "opened it back up cause it wouldn't heal; wound up w/a fissure" (01/07/2017)  . BIV ICD INSERTION CRT-D N/A 05/02/2017   Procedure: BIV ICD INSERTION CRT-D;  Surgeon: Deboraha Sprang, MD;  Location: [facility];  Service: Cardiovascular;  Laterality: N/A;  . CARDIAC CATHETERIZATION  10/29/2005  . CARDIAC CATHETERIZATION N/A 08/27/2016   Procedure: Right/Left Heart Cath and Coronary/Graft Angiography;  Surgeon: Burnell Blanks, MD;  Location: [facility];  Service: Cardiovascular;  Laterality: N/A;  . CATARACT EXTRACTION W/ INTRAOCULAR LENS  IMPLANT, BILATERAL Bilateral   . CATARACT EXTRACTION, BILATERAL  2012  . COLONOSCOPY  06/30/2008   no repeats needed   . COLONOSCOPY     had 3 or 4 in the past   . CORONARY ARTERY BYPASS GRAFT  2007   "CABG X4"  . CYST EXCISION PERINEAL  1980s  . HAMMER TOE SURGERY Bilateral   . JOINT REPLACEMENT    . KNEE ARTHROPLASTY  07/30/2011   Procedure: COMPUTER ASSISTED TOTAL KNEE ARTHROPLASTY;  Surgeon: Meredith Pel;  Location: [facility];  Service: Orthopedics;  Laterality: Left;  left total knee arthroplasty  . MASTECTOMY SUBCUTANEOUS Bilateral   . MULTIPLE TOOTH EXTRACTIONS    . ORIF FINGER /  THUMB FRACTURE Right ~ 1980   "repair of thumb injury"  . PROSTATE BIOPSY    . REPLACEMENT TOTAL KNEE BILATERAL Bilateral 2012  . TEE WITHOUT CARDIOVERSION N/A 03/04/2017   Procedure: TRANSESOPHAGEAL ECHOCARDIOGRAM (TEE);  Surgeon: Burnell Blanks, MD;  Location: [facility];  Service: Open Heart Surgery;  Laterality: N/A;  . TRANSCATHETER AORTIC VALVE REPLACEMENT, TRANSFEMORAL N/A 03/04/2017   Procedure: TRANSCATHETER AORTIC VALVE REPLACEMENT, TRANSFEMORAL;  Surgeon: Burnell Blanks, MD;  Location: [facility];  Service: Open Heart Surgery;  Laterality: N/A;    Current Facility-Administered Medications  Medication Dose Route Frequency Provider Last Rate Last Dose  . ceFAZolin (ANCEF) 3 g in dextrose 5 % 50 mL IVPB  3 g Intravenous To SS-Surg Dean, Tonna Corner, MD      . chlorhexidine (HIBICLENS) 4 % liquid 4 application  60 mL Topical Once Magnant, Charles L, PA-C      . chlorhexidine (HIBICLENS) 4 % liquid 4 application  60 mL Topical Once Magnant, Charles L, PA-C      . povidone-iodine  10 % swab 2 application  2 application Topical Once Magnant, Charles L, PA-C       Allergies  Allergen Reactions  . Peanut-Containing Drug Products Anaphylaxis  . Sulfonamide Derivatives Anaphylaxis  . Amlodipine Swelling    Swelling in ankles  . Eliquis [Apixaban] Other (See Comments)    Back/hip pain  . Lisinopril Cough    cough  . Xarelto [Rivaroxaban] Other (See Comments)    Back/hip pain    Social History   Tobacco Use  . Smoking status: Former Smoker    Packs/day: 3.50    Years: 13.00    Pack years: 45.50    Types: Cigarettes    Quit date: 1963    Years since quitting: 57.7  . Smokeless tobacco: Never Used  Substance Use Topics  . Alcohol use: Yes    Alcohol/week: 0.0 standard drinks    Comment: 01/07/2017 "might have 2 beers 2-3 times/month"    Family History  Problem Relation Age of Onset  . Heart attack Father 42  . Allergic rhinitis Father   . Asthma Father   . Heart  failure Mother 41  . Uterine cancer Mother   . Breast cancer Mother   . Colon cancer Neg Hx   . Esophageal cancer Neg Hx       Review of Systems  Musculoskeletal: Positive for joint pain.  All other systems reviewed and are negative.    Objective:  Physical Exam  Constitutional: He appears well-developed.  HENT:  Head: Normocephalic.  Eyes: Pupils are equal, round, and reactive to light.  Neck: Normal range of motion.  Respiratory: Effort normal.  Neurological: He is alert.  Skin: Skin is warm.  Psychiatric: He has a normal mood and affect. Thought content normal.  Examination of the right knee demonstrates stable collateral ligaments at 0 30 and 90 degrees of flexion.  Extensor mechanism is intact but he does have a little bit of periretinacular tenderness.  There is no effusion or warmth to the right knee.  Pedal pulses are palpable.  Range of motion is excellent full extension to past 90 degrees of flexion.  Vital signs in last 24 hours: Temp:  [97.9 F (36.6 C)] 97.9 F (36.6 C) (09/22 0615) Pulse Rate:  [67] 67 (09/22 0615) Resp:  [20] 20 (09/22 0615) BP: (110)/(60) 110/60 (09/22 0615) SpO2:  [96 %] 96 % (09/22 0615) Weight:  [id number] kg] 119.2 kg (09/22 0606)  Labs:  Estimated body mass index is 42.42 kg/m as calculated from the following:   Height as of this encounter: 5\' 6"  (1.676 m).   Weight as of this encounter: 119.2 kg.  Imaging Review Plain radiographs demonstrate Total knee replacement components in good position alignment.  No evidence of cement mantle loosening.  There is evidence of patellar implant loosening degenerative joint disease of the right knee(s). The overall alignment is neutral.There is evidence of loosening of the patellar components. The bone quality appears to be good for age and reported activity level. There is no evidence of patellar maltracking.    Assessment/Plan:  End stage arthritis, right knee(s) with failed previous  arthroplasty.   The patient history, physical examination, clinical judgment of the provider and imaging studies are consistent with end stage degenerative joint disease of the right knee(s), previous total knee arthroplasty. Revision total knee arthroplasty is deemed medically necessary. The treatment options including medical management, injection therapy, arthroscopy and revision arthroplasty were discussed at length. The risks and benefits of revision total knee arthroplasty  were presented and reviewed. The risks due to aseptic loosening, infection, stiffness, patella tracking problems, thromboembolic complications and other imponderables were discussed. The patient acknowledged the explanation, agreed to proceed with the plan and consent was signed. Patient is being admitted for inpatient treatment for surgery, pain control, PT, OT, prophylactic antibiotics, VTE prophylaxis, progressive ambulation and ADL's and discharge planning.The patient is planning to be discharged home with home health services

## 2019-05-18 NOTE — Progress Notes (Signed)
Pt stable In cpm Right foot df and pf ok  Foot perfused and sensate Plan dc to home possibly tomorrow

## 2019-05-19 MED ORDER — ASPIRIN 81 MG PO TABS: 81.0000 mg | ORAL_TABLET | Freq: Every day | ORAL | 0 refills | Status: DC

## 2019-05-19 MED ORDER — OXYCODONE HCL 5 MG PO TABS: 5.0000 mg | ORAL_TABLET | Freq: Four times a day (QID) | ORAL | 0 refills | Status: DC | PRN

## 2019-05-19 MED ORDER — METHOCARBAMOL 500 MG PO TABS: 500.0000 mg | ORAL_TABLET | Freq: Three times a day (TID) | ORAL | 0 refills | Status: DC | PRN

## 2019-05-19 NOTE — TOC Transition Note (Signed)
Transition of Care [facility]) - CM/SW Discharge Note   Patient Details  Name: Justin [name] MRN: [id number] Date of Birth: [birth year]  [facility] CM/SW Contact:  Midge Minium RN, BSN, NCM-BC, ACM-RN [phone] Phone Number: 05/19/2019, 9:09 AM   Clinical Narrative:    CM following for dispositional needs. Patient states he lives at home with his spouse and was independent PTA; reports having (2) RW, SPC and CPM machine at home. PCP/Demo verified. Patient is s/p Right patellar prosthesis revision. HHPT arranged with [facility] prior to patients admission, with patient agreeable to [facility] services. Patient declined needing a BSC. Patient stated his spouse will assist at home and provide transportation. No further needs from CM.    Final next level of care: [place] Barriers to Discharge: No Barriers Identified   Patient Goals and CMS Choice Patient states their goals for this hospitalization and ongoing recovery are:: "to get home" CMS Medicare.gov Compare Post Acute Care list provided to:: Patient Choice offered to / list presented to : Patient   Discharge Plan and Services                DME Arranged: N/A DME Agency: NA       HH Arranged: PT HH Agency: [facility]formerly [employer]) Date [place]: 05/19/19 Time [place]: 0908 Representative spoke with at [place]: [place]  Social Determinants of Health ([place]) Interventions     Readmission Risk Interventions No flowsheet data found.

## 2019-05-19 NOTE — Op Note (Signed)
NAME: Justin [name], Justin [name] MEDICAL RECORD [id number] ACCOUNT [id number] DATE OF BIRTH:[birth year] FACILITY: [facility] LOCATION: [facility] PHYSICIAN:GREGORY Randel Pigg, MD  OPERATIVE REPORT  DATE OF PROCEDURE:  05/18/2019  PREOPERATIVE DIAGNOSIS:  Right knee patellar loosening.  POSTOPERATIVE DIAGNOSIS:  Right knee patellar loosening.  PROCEDURE:  Right knee arthrotomy with revision of patellar component from 41 mm 3-peg cemented patella J and J to 41 mm 3-peg cemented patella from J and J.  SURGEON:  Meredith Pel, MD  ASSISTANT:  Annie Main, PA-C   INDICATIONS:  The patient is an 85 year old patient with pain going up and down stairs and evidence of loosening of the patella on radiographs.  Presents now for operative management after explanation of risks and benefits.  PROCEDURE IN DETAIL:  The patient was brought to the operating room where general anesthetic was induced.  Preoperative antibiotics were administered.  Timeout was called.  Right leg was prescrubbed with alcohol and Betadine, allowed to air dry, prepped  with DuraPrep solution, and draped in a sterile manner.  [name] was used to cover the operative field.  Leg was elevated and exsanguinated with the Esmarch wrap.  Tourniquet was inflated.  Median parapatellar arthrotomy was made.  There was some  discoloration of the synovium proximal to the distal femur.  This was removed.  The femoral and tibial components were intact.  No excessive wear on the tibial platform.  At this time, the patella was inspected.  There was some overgrowth of bone and  soft tissue encompassing the patella; however, once that was removed, the patella was obviously loose and it was removed easily.  Cement mantle was removed with it.  The remaining patellar shell was thin, approximately 12 mm.  Curetting was performed in  order to remove soft tissue from the patella.  Excess cement was also removed, particularly from within the holes.  At this time, thorough  irrigation was performed.  IrriSept was used at all times during the case to prevent infection.  Following partial  anterior and medial and lateral gutter synovectomy, the knee joint was thoroughly irrigated.  The new patella was then cemented into position with good cement bonding occurring.  After this, the excess cement was removed, and once full hardening had  occurred, the tourniquet was released.  Bleeding points encountered controlled using electrocautery.  Irrigation was again performed.  Irrisept was utilized.  Tranexamic acid sponge was also utilized.  The vancomycin powder was then placed within the  knee joint as well as on top of the arthrotomy closure.  Closed over flexion using #1 Vicryl suture followed by interrupted inverted 0 Vicryl suture, 2-0 Vicryl suture, and 3-0 Monocryl.  Steri-Strips and an Aquacel dressing were applied.  A solution of  Marcaine, morphine, clonidine was injected into the knee for postop pain relief.  The patient tolerated the procedure well without immediate complications.  [name]'s assistance was required at all times during the case for retraction, opening and closing.   His assistance was a medical necessity.  LN/NUANCE  D:05/18/2019 T:05/18/2019 JOB:[id number]

## 2019-05-19 NOTE — Progress Notes (Signed)
  Subjective: Justin [name] is a 85 y.o. male s/p Right patellar prosthesis revision.  They are POD1.  Pt's pain is controlled.  Pt has ambulated with little difficulty.     Objective: Vital signs in last 24 hours: Temp:  [97.6 F (36.4 C)-98.2 F (36.8 C)] 97.6 F (36.4 C) (09/23 0421) Pulse Rate:  [55-82] 76 (09/23 0421) Resp:  [14-21] 14 (09/23 0421) BP: (95-124)/(48-67) 118/60 (09/23 0421) SpO2:  [92 %-99 %] 96 % (09/23 0421)  Intake/Output from previous day: 09/22 0701 - 09/23 0700 In: 1632 [P.O.:832; I.V.:800] Out: 375 [Urine:350; Blood:25] Intake/Output this shift: No intake/output data recorded.  Exam:  No gross blood or drainage overlying the dressing 2+ DP pulse Sensation intact distally in the right foot Able to dorsiflex and plantarflex the right foot   Labs: No results for input(s): HGB in the last 72 hours. No results for input(s): WBC, RBC, HCT, PLT in the last 72 hours. No results for input(s): NA, K, CL, CO2, BUN, CREATININE, GLUCOSE, CALCIUM in the last 72 hours. No results for input(s): LABPT, INR in the last 72 hours.  Assessment/Plan: Pt is POD1 s/p Right patellar prosthesis revision.    -Plan to discharge to home today   -WBAT with a walker  -Okay to shower, dressing is waterproof.  Cautioned patient against soaking dressing in bath/pool/body of water  -Encouraged the use of the blue cradle boot to work on extension.  Cautioned patient against using a pillow under their knee.  -Use the CPM machine at least 3 times per day for one hour each time, increasing the degrees daily.     Justin [name] L Anandi Abramo 05/19/2019, 7:16 AM

## 2019-05-19 NOTE — Evaluation (Signed)
Physical Therapy Evaluation Patient Details Name: Justin [name] MRN: [id number] DOB: 07-03-1934 Today's Date: 05/19/2019   History of Present Illness  85 y.o. male admitted on 05/18/19 for R TKA revision.  Pt is WBAT post op.  Pt with significant PMH of s/p TAVR, CABG, PAF, obseity, MI, HTN, gout, CHF, CAD, macular degeneration, bil TKA, bil foot surgery, BIV ICD.   Clinical Impression  Pt was able to walk initially with RW and then transition to a cane, demonstrate safe stair training and review his HEP.  He had done all mobility practice he needs to do to safely d/c home.   PT to follow acutely for deficits listed below.      Follow Up Recommendations Follow surgeon's recommendation for DC plan and follow-up therapies    Equipment Recommendations  None recommended by PT    Recommendations for Other Services   NA    Precautions / Restrictions Precautions Precautions: Knee Precaution Booklet Issued: Yes (comment) Precaution Comments: knee exercise handout given, precaution reviewed Restrictions Weight Bearing Restrictions: Yes RLE Weight Bearing: Weight bearing as tolerated      Mobility  Bed Mobility Overal bed mobility: Modified Independent                Transfers Overall transfer level: Modified independent                  Ambulation/Gait Ambulation/Gait assistance: Modified independent (Device/Increase time) Gait Distance (Feet): 300 Feet Assistive device: Rolling walker (2 wheeled);Straight cane Gait Pattern/deviations: Step-through pattern;Antalgic   Gait velocity interpretation: >2.62 ft/sec, indicative of community ambulatory General Gait Details: Pt with good heel to toe pattern on the right, very mildly antalgic, started with RW and changed to cane 1/2 without difficulty or instability  Stairs Stairs: Yes Stairs assistance: Min guard Stair Management: No rails;Step to pattern;Forwards Number of Stairs: 1 General stair comments: simulated  curb step to enter home, pt able to tell and demonstrate correct LE sequencing for ascending and descending the stairs.          Balance Overall balance assessment: No apparent balance deficits (not formally assessed)                                           Pertinent Vitals/Pain Pain Assessment: Faces Faces Pain Scale: Hurts little more Pain Location: R thigh Pain Descriptors / Indicators: Aching;Burning Pain Intervention(s): Limited activity within patient's tolerance;Monitored during session;Repositioned    Home Living Family/patient expects to be discharged to:: Private residence Living Arrangements: Spouse/significant other Available Help at Discharge: Family;Available 24 hours/day Type of Home: House Home Access: Stairs to enter Entrance Stairs-Rails: None Entrance Stairs-Number of Steps: 1 Home Layout: One level Home Equipment: Walker - 2 wheels;Cane - single point      Prior Function Level of Independence: Independent               Hand Dominance   Dominant Hand: Right    Extremity/Trunk Assessment   Upper Extremity Assessment Upper Extremity Assessment: Overall WFL for tasks assessed    Lower Extremity Assessment Lower Extremity Assessment: RLE deficits/detail RLE Deficits / Details: right leg with 4/5 ankle, 3/5 knee extension, 3/5 hip flexion RLE Sensation: WNL    Cervical / Trunk Assessment Cervical / Trunk Assessment: Normal  Communication   Communication: No difficulties  Cognition Arousal/Alertness: Awake/alert Behavior During Therapy: WFL for tasks assessed/performed Overall Cognitive  Status: Within Functional Limits for tasks assessed                                           Exercises Total Joint Exercises Ankle Circles/Pumps: AROM;Both;20 reps Quad Sets: AROM;Right;10 reps Towel Squeeze: AROM;Both;10 reps Short Arc Quad: AROM;Right;10 reps Heel Slides: AAROM;Right;10 reps Hip  ABduction/ADduction: AROM;Right;10 reps Straight Leg Raises: AROM;Right;10 reps Long Arc Quad: AROM;Right;10 reps Knee Flexion: AROM;Right;10 reps;Seated Goniometric ROM: 0-110   Assessment/Plan    PT Assessment Patient needs continued PT services  PT Problem List Decreased strength;Decreased range of motion;Decreased activity tolerance;Decreased balance;Decreased mobility;Decreased knowledge of use of DME;Decreased knowledge of precautions;Pain;Obesity       PT Treatment Interventions DME instruction;Gait training;Functional mobility training;Stair training;Therapeutic activities;Therapeutic exercise;Balance training;Patient/family education;Manual techniques;Modalities    PT Goals (Current goals can be found in the Care Plan section)  Acute Rehab PT Goals Patient Stated Goal: to get healed up so that he can start cardiac rehab OP again PT Goal Formulation: With patient Time For Goal Achievement: 06/02/19 Potential to Achieve Goals: Good    Frequency 7X/week           AM-PAC PT "6 Clicks" Mobility  Outcome Measure Help needed turning from your back to your side while in a flat bed without using bedrails?: None Help needed moving from lying on your back to sitting on the side of a flat bed without using bedrails?: None Help needed moving to and from a bed to a chair (including a wheelchair)?: None Help needed standing up from a chair using your arms (e.g., wheelchair or bedside chair)?: None Help needed to walk in hospital room?: None Help needed climbing 3-5 steps with a railing? : A Little 6 Click Score: 23    End of Session   Activity Tolerance: Patient tolerated treatment well Patient left: in chair;with call bell/phone within reach Nurse Communication: Mobility status PT Visit Diagnosis: Muscle weakness (generalized) (M62.81);Difficulty in walking, not elsewhere classified (R26.2);Pain Pain - Right/Left: Right Pain - part of body: Knee    Time: [id number] PT Time  Calculation (min) (ACUTE ONLY): 29 min   Charges:         Wells Guiles B. Hanh Kertesz, PT, DPT  Acute Rehabilitation [phone] pager #[phone] office  @ [facility]: [phone]   PT Evaluation $PT Eval Moderate Complexity: 1 Mod PT Treatments $Gait Training: 8-22 mins       05/19/2019, 11:18 AM

## 2019-05-19 NOTE — Progress Notes (Signed)
   05/19/19 1008  AVS Discharge Documentation  AVS Discharge Instructions Including Medications Provided to patient/caregiver  Name of Person Receiving AVS Discharge Instructions Including Medications Nicholaus  Name of Clinician That Reviewed AVS Discharge Instructions Including Medications Dineen Kid RN  Pt verbalized understanding

## 2019-05-20 ENCOUNTER — Encounter (HOSPITAL_COMMUNITY): Payer: Self-pay | Admitting: Orthopedic Surgery

## 2019-05-20 DIAGNOSIS — Z87891 Personal history of nicotine dependence: Secondary | ICD-10-CM | POA: Diagnosis not present

## 2019-05-20 DIAGNOSIS — Z95810 Presence of automatic (implantable) cardiac defibrillator: Secondary | ICD-10-CM | POA: Diagnosis not present

## 2019-05-20 DIAGNOSIS — Z7982 Long term (current) use of aspirin: Secondary | ICD-10-CM | POA: Diagnosis not present

## 2019-05-20 DIAGNOSIS — J45909 Unspecified asthma, uncomplicated: Secondary | ICD-10-CM | POA: Diagnosis not present

## 2019-05-20 DIAGNOSIS — Z952 Presence of prosthetic heart valve: Secondary | ICD-10-CM | POA: Diagnosis not present

## 2019-05-20 DIAGNOSIS — I110 Hypertensive heart disease with heart failure: Secondary | ICD-10-CM | POA: Diagnosis not present

## 2019-05-20 DIAGNOSIS — K219 Gastro-esophageal reflux disease without esophagitis: Secondary | ICD-10-CM | POA: Diagnosis not present

## 2019-05-20 DIAGNOSIS — I2510 Atherosclerotic heart disease of native coronary artery without angina pectoris: Secondary | ICD-10-CM | POA: Diagnosis not present

## 2019-05-20 DIAGNOSIS — Z951 Presence of aortocoronary bypass graft: Secondary | ICD-10-CM | POA: Diagnosis not present

## 2019-05-20 DIAGNOSIS — I255 Ischemic cardiomyopathy: Secondary | ICD-10-CM | POA: Diagnosis not present

## 2019-05-20 DIAGNOSIS — E039 Hypothyroidism, unspecified: Secondary | ICD-10-CM | POA: Diagnosis not present

## 2019-05-20 DIAGNOSIS — I480 Paroxysmal atrial fibrillation: Secondary | ICD-10-CM | POA: Diagnosis not present

## 2019-05-20 DIAGNOSIS — M109 Gout, unspecified: Secondary | ICD-10-CM | POA: Diagnosis not present

## 2019-05-20 DIAGNOSIS — H3530 Unspecified macular degeneration: Secondary | ICD-10-CM | POA: Diagnosis not present

## 2019-05-20 DIAGNOSIS — T84032D Mechanical loosening of internal right knee prosthetic joint, subsequent encounter: Secondary | ICD-10-CM | POA: Diagnosis not present

## 2019-05-20 DIAGNOSIS — E785 Hyperlipidemia, unspecified: Secondary | ICD-10-CM | POA: Diagnosis not present

## 2019-05-20 DIAGNOSIS — I252 Old myocardial infarction: Secondary | ICD-10-CM | POA: Diagnosis not present

## 2019-05-20 DIAGNOSIS — I5033 Acute on chronic diastolic (congestive) heart failure: Secondary | ICD-10-CM | POA: Diagnosis not present

## 2019-05-20 DIAGNOSIS — I5023 Acute on chronic systolic (congestive) heart failure: Secondary | ICD-10-CM | POA: Diagnosis not present

## 2019-05-20 DIAGNOSIS — Z96652 Presence of left artificial knee joint: Secondary | ICD-10-CM | POA: Diagnosis not present

## 2019-05-22 DIAGNOSIS — H3530 Unspecified macular degeneration: Secondary | ICD-10-CM | POA: Diagnosis not present

## 2019-05-22 DIAGNOSIS — E039 Hypothyroidism, unspecified: Secondary | ICD-10-CM | POA: Diagnosis not present

## 2019-05-22 DIAGNOSIS — I255 Ischemic cardiomyopathy: Secondary | ICD-10-CM | POA: Diagnosis not present

## 2019-05-22 DIAGNOSIS — I480 Paroxysmal atrial fibrillation: Secondary | ICD-10-CM | POA: Diagnosis not present

## 2019-05-22 DIAGNOSIS — M109 Gout, unspecified: Secondary | ICD-10-CM | POA: Diagnosis not present

## 2019-05-22 DIAGNOSIS — Z951 Presence of aortocoronary bypass graft: Secondary | ICD-10-CM | POA: Diagnosis not present

## 2019-05-22 DIAGNOSIS — Z95810 Presence of automatic (implantable) cardiac defibrillator: Secondary | ICD-10-CM | POA: Diagnosis not present

## 2019-05-22 DIAGNOSIS — Z952 Presence of prosthetic heart valve: Secondary | ICD-10-CM | POA: Diagnosis not present

## 2019-05-22 DIAGNOSIS — E785 Hyperlipidemia, unspecified: Secondary | ICD-10-CM | POA: Diagnosis not present

## 2019-05-22 DIAGNOSIS — I5023 Acute on chronic systolic (congestive) heart failure: Secondary | ICD-10-CM | POA: Diagnosis not present

## 2019-05-22 DIAGNOSIS — Z87891 Personal history of nicotine dependence: Secondary | ICD-10-CM | POA: Diagnosis not present

## 2019-05-22 DIAGNOSIS — Z7982 Long term (current) use of aspirin: Secondary | ICD-10-CM | POA: Diagnosis not present

## 2019-05-22 DIAGNOSIS — I2510 Atherosclerotic heart disease of native coronary artery without angina pectoris: Secondary | ICD-10-CM | POA: Diagnosis not present

## 2019-05-22 DIAGNOSIS — J45909 Unspecified asthma, uncomplicated: Secondary | ICD-10-CM | POA: Diagnosis not present

## 2019-05-22 DIAGNOSIS — K219 Gastro-esophageal reflux disease without esophagitis: Secondary | ICD-10-CM | POA: Diagnosis not present

## 2019-05-22 DIAGNOSIS — I5033 Acute on chronic diastolic (congestive) heart failure: Secondary | ICD-10-CM | POA: Diagnosis not present

## 2019-05-22 DIAGNOSIS — T84032D Mechanical loosening of internal right knee prosthetic joint, subsequent encounter: Secondary | ICD-10-CM | POA: Diagnosis not present

## 2019-05-22 DIAGNOSIS — I252 Old myocardial infarction: Secondary | ICD-10-CM | POA: Diagnosis not present

## 2019-05-22 DIAGNOSIS — I110 Hypertensive heart disease with heart failure: Secondary | ICD-10-CM | POA: Diagnosis not present

## 2019-05-22 DIAGNOSIS — Z96652 Presence of left artificial knee joint: Secondary | ICD-10-CM | POA: Diagnosis not present

## 2019-05-23 LAB — AEROBIC/ANAEROBIC CULTURE W GRAM STAIN (SURGICAL/DEEP WOUND)
Culture: NO GROWTH
Gram Stain: NONE SEEN

## 2019-05-24 DIAGNOSIS — H3530 Unspecified macular degeneration: Secondary | ICD-10-CM | POA: Diagnosis not present

## 2019-05-24 DIAGNOSIS — I5033 Acute on chronic diastolic (congestive) heart failure: Secondary | ICD-10-CM | POA: Diagnosis not present

## 2019-05-24 DIAGNOSIS — J45909 Unspecified asthma, uncomplicated: Secondary | ICD-10-CM | POA: Diagnosis not present

## 2019-05-24 DIAGNOSIS — I252 Old myocardial infarction: Secondary | ICD-10-CM | POA: Diagnosis not present

## 2019-05-24 DIAGNOSIS — Z951 Presence of aortocoronary bypass graft: Secondary | ICD-10-CM | POA: Diagnosis not present

## 2019-05-24 DIAGNOSIS — K219 Gastro-esophageal reflux disease without esophagitis: Secondary | ICD-10-CM | POA: Diagnosis not present

## 2019-05-24 DIAGNOSIS — I2510 Atherosclerotic heart disease of native coronary artery without angina pectoris: Secondary | ICD-10-CM | POA: Diagnosis not present

## 2019-05-24 DIAGNOSIS — I5023 Acute on chronic systolic (congestive) heart failure: Secondary | ICD-10-CM | POA: Diagnosis not present

## 2019-05-24 DIAGNOSIS — Z96652 Presence of left artificial knee joint: Secondary | ICD-10-CM | POA: Diagnosis not present

## 2019-05-24 DIAGNOSIS — E039 Hypothyroidism, unspecified: Secondary | ICD-10-CM | POA: Diagnosis not present

## 2019-05-24 DIAGNOSIS — Z95810 Presence of automatic (implantable) cardiac defibrillator: Secondary | ICD-10-CM | POA: Diagnosis not present

## 2019-05-24 DIAGNOSIS — E785 Hyperlipidemia, unspecified: Secondary | ICD-10-CM | POA: Diagnosis not present

## 2019-05-24 DIAGNOSIS — Z87891 Personal history of nicotine dependence: Secondary | ICD-10-CM | POA: Diagnosis not present

## 2019-05-24 DIAGNOSIS — Z952 Presence of prosthetic heart valve: Secondary | ICD-10-CM | POA: Diagnosis not present

## 2019-05-24 DIAGNOSIS — T84032D Mechanical loosening of internal right knee prosthetic joint, subsequent encounter: Secondary | ICD-10-CM | POA: Diagnosis not present

## 2019-05-24 DIAGNOSIS — M109 Gout, unspecified: Secondary | ICD-10-CM | POA: Diagnosis not present

## 2019-05-24 DIAGNOSIS — Z7982 Long term (current) use of aspirin: Secondary | ICD-10-CM | POA: Diagnosis not present

## 2019-05-24 DIAGNOSIS — I480 Paroxysmal atrial fibrillation: Secondary | ICD-10-CM | POA: Diagnosis not present

## 2019-05-24 DIAGNOSIS — I110 Hypertensive heart disease with heart failure: Secondary | ICD-10-CM | POA: Diagnosis not present

## 2019-05-24 DIAGNOSIS — I255 Ischemic cardiomyopathy: Secondary | ICD-10-CM | POA: Diagnosis not present

## 2019-05-25 ENCOUNTER — Ambulatory Visit (INDEPENDENT_AMBULATORY_CARE_PROVIDER_SITE_OTHER): Payer: Medicare Other

## 2019-05-25 DIAGNOSIS — I5023 Acute on chronic systolic (congestive) heart failure: Secondary | ICD-10-CM | POA: Diagnosis not present

## 2019-05-25 DIAGNOSIS — Z7982 Long term (current) use of aspirin: Secondary | ICD-10-CM | POA: Diagnosis not present

## 2019-05-25 DIAGNOSIS — Z96652 Presence of left artificial knee joint: Secondary | ICD-10-CM | POA: Diagnosis not present

## 2019-05-25 DIAGNOSIS — J309 Allergic rhinitis, unspecified: Secondary | ICD-10-CM | POA: Diagnosis not present

## 2019-05-25 DIAGNOSIS — Z87891 Personal history of nicotine dependence: Secondary | ICD-10-CM | POA: Diagnosis not present

## 2019-05-25 DIAGNOSIS — E039 Hypothyroidism, unspecified: Secondary | ICD-10-CM | POA: Diagnosis not present

## 2019-05-25 DIAGNOSIS — I110 Hypertensive heart disease with heart failure: Secondary | ICD-10-CM | POA: Diagnosis not present

## 2019-05-25 DIAGNOSIS — I252 Old myocardial infarction: Secondary | ICD-10-CM | POA: Diagnosis not present

## 2019-05-25 DIAGNOSIS — J45909 Unspecified asthma, uncomplicated: Secondary | ICD-10-CM | POA: Diagnosis not present

## 2019-05-25 DIAGNOSIS — K219 Gastro-esophageal reflux disease without esophagitis: Secondary | ICD-10-CM | POA: Diagnosis not present

## 2019-05-25 DIAGNOSIS — T84032D Mechanical loosening of internal right knee prosthetic joint, subsequent encounter: Secondary | ICD-10-CM | POA: Diagnosis not present

## 2019-05-25 DIAGNOSIS — E785 Hyperlipidemia, unspecified: Secondary | ICD-10-CM | POA: Diagnosis not present

## 2019-05-25 DIAGNOSIS — H3530 Unspecified macular degeneration: Secondary | ICD-10-CM | POA: Diagnosis not present

## 2019-05-25 DIAGNOSIS — I2510 Atherosclerotic heart disease of native coronary artery without angina pectoris: Secondary | ICD-10-CM | POA: Diagnosis not present

## 2019-05-25 DIAGNOSIS — Z95810 Presence of automatic (implantable) cardiac defibrillator: Secondary | ICD-10-CM | POA: Diagnosis not present

## 2019-05-25 DIAGNOSIS — Z951 Presence of aortocoronary bypass graft: Secondary | ICD-10-CM | POA: Diagnosis not present

## 2019-05-25 DIAGNOSIS — I255 Ischemic cardiomyopathy: Secondary | ICD-10-CM | POA: Diagnosis not present

## 2019-05-25 DIAGNOSIS — M109 Gout, unspecified: Secondary | ICD-10-CM | POA: Diagnosis not present

## 2019-05-25 DIAGNOSIS — I5033 Acute on chronic diastolic (congestive) heart failure: Secondary | ICD-10-CM | POA: Diagnosis not present

## 2019-05-25 DIAGNOSIS — Z952 Presence of prosthetic heart valve: Secondary | ICD-10-CM | POA: Diagnosis not present

## 2019-05-25 DIAGNOSIS — I480 Paroxysmal atrial fibrillation: Secondary | ICD-10-CM | POA: Diagnosis not present

## 2019-05-25 NOTE — Discharge Summary (Signed)
Physician Discharge Summary      Patient ID: Justin [name] MRN: [id number] DOB/AGE: 09-19-1933 85 y.o.  Admit date: 05/18/2019 Discharge date: 05/19/2019  Admission Diagnoses:  Active Problems:   Loosening of knee joint prosthesis [facility])   Discharge Diagnoses:  Same  Surgeries: Procedure(s): RIGHT PATELLA REVISION/REMOVAL on 05/18/2019   Consultants:   Discharged Condition: Stable  Hospital Course: Justin [name] is an 85 y.o. male who was admitted 05/18/2019 with a chief complaint of Right knee pain, and found to have a diagnosis of Right patellar prosthesis loosening.  They were brought to the operating room on 05/18/2019 and underwent the above named procedures.  Pt awoke from anesthesia without complication and was transferred to the floor. On POD1, patient's pain was well-controlled and he was able to ambulate well without significant discomfort.  Pt was discharged on POD1.  Pt will f/u with Dr. Marlou Sa in clinic in ~2 weeks.   Antibiotics given:  Anti-infectives (From admission, onward)   Start     Dose/Rate Route Frequency Ordered Stop   05/18/19 1830  ceFAZolin (ANCEF) IVPB 2g/100 mL premix     2 g 200 mL/hr over 30 Minutes Intravenous Every 6 hours 05/18/19 1828 05/19/19 0223   05/18/19 0910  vancomycin (VANCOCIN) powder  Status:  Discontinued       As needed 05/18/19 0910 05/18/19 1013   05/18/19 0630  ceFAZolin (ANCEF) 3 g in dextrose 5 % 50 mL IVPB     3 g 100 mL/hr over 30 Minutes Intravenous To ShortStay [facility] 05/17/19 1445 05/18/19 0735    .  Recent vital signs:  Vitals:   05/19/19 0421 05/19/19 0802  BP: 118/60 (!) 143/77  Pulse: 76 68  Resp: 14 16  Temp: 97.6 F (36.4 C) 97.6 F (36.4 C)  SpO2: 96% 96%    Recent laboratory studies:  Results for orders placed or performed during the hospital encounter of 05/18/19  Aerobic/Anaerobic Culture (surgical/deep wound)   Specimen: PATH Cytology Misc. fluid; Body Fluid  Result Value Ref Range   Specimen Description WOUND SWAB    Special Requests RIGHT KNEE JOINT    Gram Stain NO WBC SEEN NO ORGANISMS SEEN     Culture      No growth aerobically or anaerobically. Performed at [facility], [place] [address], [place], [place] [address]    Report Status 05/23/2019 FINAL   PTT Day of Surgery  Result Value Ref Range   aPTT 33 24 - 36 seconds    Discharge Medications:   Allergies as of 05/19/2019      Reactions   Peanut-containing Drug Products Anaphylaxis   Sulfonamide Derivatives Anaphylaxis   Amlodipine Swelling   Swelling in ankles   Eliquis [apixaban] Other (See Comments)   Back/hip pain   Lisinopril Cough   cough   Xarelto [rivaroxaban] Other (See Comments)   Back/hip pain      Medication List    STOP taking these medications   HYDROcodone-homatropine 5-1.5 MG/5ML syrup Commonly known as: HYCODAN     TAKE these medications   aspirin 81 MG tablet Take 1 tablet (81 mg total) by mouth daily.   atorvastatin 20 MG tablet Commonly known as: LIPITOR TAKE 1 TABLET ONCE DAILY.   Azelastine-Fluticasone 137-50 MCG/ACT Susp Commonly known as: Dymista USE 2 SPRAYS EACH NOSTRIL TWICE DAILY. What changed:   how much to take  how to take this  when to take this   benzonatate 200 MG capsule Commonly known as:  TESSALON Take 200 mg by mouth 3 (three) times daily as needed for cough.   cetirizine 10 MG tablet Commonly known as: ZYRTEC Take 10 mg by mouth daily.   chlorpheniramine 4 MG tablet Commonly known as: CHLOR-TRIMETON Take 2 tablets (8 mg total) by mouth at bedtime.   dabigatran 150 MG Caps capsule Commonly known as: Pradaxa Take 1 capsule (150 mg total) by mouth 2 (two) times daily.   EPINEPHrine 0.3 mg/0.3 mL Soaj injection Commonly known as: EPI-PEN Inject 0.3 mLs (0.3 mg total) into the muscle as needed for anaphylaxis.   famotidine 10 MG tablet Commonly known as: PEPCID Take 10 mg by mouth at bedtime.   furosemide 80 MG tablet  Commonly known as: LASIX Take 1 tablet (80 mg total) by mouth 2 (two) times daily. TAKE ONE TABLET ONCE A DAY ALTERNATING WITH ONE TABLET TWICE A DAY. What changed:   when to take this  additional instructions   ipratropium 0.03 % nasal spray Commonly known as: ATROVENT Place 2 sprays into both nostrils every 6 (six) hours as needed for rhinitis.   isosorbide mononitrate 30 MG 24 hr tablet Commonly known as: IMDUR TAKE 1 TABLET ONCE DAILY.   levothyroxine 100 MCG tablet Commonly known as: SYNTHROID Take 1 tablet (100 mcg total) by mouth daily.   losartan 25 MG tablet Commonly known as: COZAAR TAKE 1 TABLET EACH DAY. What changed: See the new instructions.   methocarbamol 500 MG tablet Commonly known as: Robaxin Take 1 tablet (500 mg total) by mouth every 8 (eight) hours as needed for muscle spasms.   metoprolol tartrate 25 MG tablet Commonly known as: LOPRESSOR Take 25 mg by mouth 2 (two) times daily.   montelukast 10 MG tablet Commonly known as: SINGULAIR Take 1 tablet (10 mg total) by mouth every evening.   omeprazole 20 MG capsule Commonly known as: PRILOSEC TAKE (1) CAPSULE TWICE DAILY. What changed: See the new instructions.   oxyCODONE 5 MG immediate release tablet Commonly known as: Oxy IR/ROXICODONE Take 1 tablet (5 mg total) by mouth every 6 (six) hours as needed for moderate pain (pain score 4-6).   potassium chloride SA 20 MEQ tablet Commonly known as: K-DUR Take 2 tablets by mouth twice daily alternating with 2 tablets daily. What changed:   how much to take  how to take this  when to take this  additional instructions   PRESERVISION AREDS PO Take 2 tablets by mouth every morning.   triamcinolone cream 0.1 % Commonly known as: KENALOG Apply 1 application topically daily as needed for dry skin.   valACYclovir 500 MG tablet Commonly known as: VALTREX Take 500 mg by mouth 2 (two) times daily as needed (cold sores).   ZADITOR OP Apply 1  drop to eye daily as needed (itchy eyes).       Diagnostic Studies: Dg Chest 2 View  Result Date: 04/30/2019 CLINICAL DATA:  Surgical clearance.  Unspecified asthma. EXAM: CHEST - 2 VIEW COMPARISON:  Radiographs 12/30/2017 and 05/13/2017. FINDINGS: Stable mild cardiomegaly post median sternotomy, CABG and TAVR procedure. Left subclavian AICD leads are unchanged. The lungs are clear. There is no pleural effusion or pneumothorax. There are stable mild degenerative changes throughout the spine. IMPRESSION: Stable postoperative chest.  No acute cardiopulmonary process. Electronically Signed   By: Richardean Sale M.D.   On: 04/30/2019 12:06    Disposition:   Discharge Instructions    Call MD / Call 911   Complete by: As directed  If you experience chest pain or shortness of breath, CALL 911 and be transported to the hospital emergency room.  If you develope a fever above 101 F, pus (white drainage) or increased drainage or redness at the wound, or calf pain, call your surgeon's office.   Constipation Prevention   Complete by: As directed    Drink plenty of fluids.  Prune juice may be helpful.  You may use a stool softener, such as Colace (over the counter) 100 mg twice a day.  Use MiraLax (over the counter) for constipation as needed.   Diet - low sodium heart healthy   Complete by: As directed    Discharge instructions   Complete by: As directed    You may shower, dressing is waterproof.  Do not remove the dressing, we will remove it at your first post-op appointment.  Do not take a bath or soak the knee in a tub or pool.  You may weightbear as you can tolerate on the operative leg with a walker.  Continue using the CPM machine 3 times per day for at least one hour each time, increasing the degrees of range of motion daily.  Use the blue cradle boot or a pillow under your heel to work on getting your leg straight.  Do NOT put a pillow under your knee.  You will follow-up with Dr. Marlou Sa in the  clinic in 1-2 weeks at your given appointment date.   Increase activity slowly as tolerated   Complete by: As directed       Follow-up Information    [facility] Follow up.   Specialty: Home Health Services Why: Home Health Physical Therapy Contact information: [address] [place] [address] [phone]            Signed: Donella Stade 05/25/2019, 10:42 AM

## 2019-05-26 ENCOUNTER — Other Ambulatory Visit: Payer: Self-pay

## 2019-05-26 ENCOUNTER — Other Ambulatory Visit: Payer: Self-pay | Admitting: Orthopedic Surgery

## 2019-05-26 MED ORDER — COLCHICINE 0.6 MG PO TABS: ORAL_TABLET | ORAL | 0 refills | Status: DC

## 2019-05-26 NOTE — Progress Notes (Signed)
Patient called requesting something for gout attack. Medication sent to pharmacy. Recommended to patient he come in for eval if not better within the next 24hrs

## 2019-05-27 DIAGNOSIS — J45909 Unspecified asthma, uncomplicated: Secondary | ICD-10-CM | POA: Diagnosis not present

## 2019-05-27 DIAGNOSIS — Z7982 Long term (current) use of aspirin: Secondary | ICD-10-CM | POA: Diagnosis not present

## 2019-05-27 DIAGNOSIS — Z96652 Presence of left artificial knee joint: Secondary | ICD-10-CM | POA: Diagnosis not present

## 2019-05-27 DIAGNOSIS — I255 Ischemic cardiomyopathy: Secondary | ICD-10-CM | POA: Diagnosis not present

## 2019-05-27 DIAGNOSIS — Z951 Presence of aortocoronary bypass graft: Secondary | ICD-10-CM | POA: Diagnosis not present

## 2019-05-27 DIAGNOSIS — E785 Hyperlipidemia, unspecified: Secondary | ICD-10-CM | POA: Diagnosis not present

## 2019-05-27 DIAGNOSIS — T84032D Mechanical loosening of internal right knee prosthetic joint, subsequent encounter: Secondary | ICD-10-CM | POA: Diagnosis not present

## 2019-05-27 DIAGNOSIS — I252 Old myocardial infarction: Secondary | ICD-10-CM | POA: Diagnosis not present

## 2019-05-27 DIAGNOSIS — I2510 Atherosclerotic heart disease of native coronary artery without angina pectoris: Secondary | ICD-10-CM | POA: Diagnosis not present

## 2019-05-27 DIAGNOSIS — Z95810 Presence of automatic (implantable) cardiac defibrillator: Secondary | ICD-10-CM | POA: Diagnosis not present

## 2019-05-27 DIAGNOSIS — I5033 Acute on chronic diastolic (congestive) heart failure: Secondary | ICD-10-CM | POA: Diagnosis not present

## 2019-05-27 DIAGNOSIS — E039 Hypothyroidism, unspecified: Secondary | ICD-10-CM | POA: Diagnosis not present

## 2019-05-27 DIAGNOSIS — I480 Paroxysmal atrial fibrillation: Secondary | ICD-10-CM | POA: Diagnosis not present

## 2019-05-27 DIAGNOSIS — H3530 Unspecified macular degeneration: Secondary | ICD-10-CM | POA: Diagnosis not present

## 2019-05-27 DIAGNOSIS — K219 Gastro-esophageal reflux disease without esophagitis: Secondary | ICD-10-CM | POA: Diagnosis not present

## 2019-05-27 DIAGNOSIS — I110 Hypertensive heart disease with heart failure: Secondary | ICD-10-CM | POA: Diagnosis not present

## 2019-05-27 DIAGNOSIS — Z952 Presence of prosthetic heart valve: Secondary | ICD-10-CM | POA: Diagnosis not present

## 2019-05-27 DIAGNOSIS — I5023 Acute on chronic systolic (congestive) heart failure: Secondary | ICD-10-CM | POA: Diagnosis not present

## 2019-05-27 DIAGNOSIS — Z87891 Personal history of nicotine dependence: Secondary | ICD-10-CM | POA: Diagnosis not present

## 2019-05-27 DIAGNOSIS — M109 Gout, unspecified: Secondary | ICD-10-CM | POA: Diagnosis not present

## 2019-05-28 ENCOUNTER — Ambulatory Visit (INDEPENDENT_AMBULATORY_CARE_PROVIDER_SITE_OTHER): Payer: Medicare Other

## 2019-05-28 ENCOUNTER — Encounter: Payer: Self-pay | Admitting: Orthopedic Surgery

## 2019-05-28 ENCOUNTER — Ambulatory Visit (INDEPENDENT_AMBULATORY_CARE_PROVIDER_SITE_OTHER): Payer: Medicare Other | Admitting: Orthopedic Surgery

## 2019-05-28 DIAGNOSIS — Z96653 Presence of artificial knee joint, bilateral: Secondary | ICD-10-CM | POA: Diagnosis not present

## 2019-05-28 NOTE — Progress Notes (Signed)
Post-Op Visit Note   Patient: Justin [name]           Date of Birth: 08-25-1934           MRN: [id number] Visit Date: 05/28/2019 PCP: Laurey Morale, MD   Assessment & Plan:  Chief Complaint:  Chief Complaint  Patient presents with   Right Knee - Routine Post Op   Visit Diagnoses:  1. Presence of artificial knee joint, bilateral     Plan: Vaughan Basta is now 2 weeks out right knee patellar revision.  He is doing well.  On exam the incision is intact he has good range of motion and strength.  No calf tenderness.  Radiographs look good.  Left wrist gout attack is improving on colchicine.  He may need to go on allopurinol.  Plan is to start physical therapy 3 times a week for 8 weeks with heart just to work on range of motion strengthening and balance.  He may need to consider going on allopurinol to diminish his baseline uric acid level.  Also of note is that he had some throat issues after surgery and should be considered a difficult intubation in the future should he require further general anesthetic.  Follow-Up Instructions: Return in about 6 weeks (around 07/09/2019).   Orders:  Orders Placed This Encounter  Procedures   XR Knee 1-2 Views Right   No orders of the defined types were placed in this encounter.   Imaging: Xr Knee 1-2 Views Right  Result Date: 05/28/2019 AP lateral right knee reviewed.  Total knee prosthesis in good position alignment.  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   Severe aortic valve stenosis 03/04/2017   Hypokalemia due to loss of potassium with diuresis 01/10/2017   Aortic stenosis [id number]   Systolic heart failure ([name]) 01/10/2017   Acute on chronic diastolic CHF (congestive heart failure) ([place]) 01/07/2017   Hypothyroidism 12/23/2016   Prostate cancer ([place])    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   Edema of both legs 09/01/2014   Asthma, intermittent 09/01/2014   Aortic valve disorders 01/01/2011   Coronary artery disease involving native coronary artery of native heart without angina pectoris    ACUTE BRONCHITIS 09/06/2010   GOUT 09/12/2009   CELLULITIS, FOOT 08/15/2009   CAD, AUTOLOGOUS BYPASS GRAFT 05/10/2009   MURMUR 05/08/2009   BRUIT 05/08/2009   MUSCLE CRAMPS 12/12/2008   KNEE SPRAIN 09/21/2008   BENIGN PROSTATIC HYPERTROPHY 07/07/2008   Osteoarthritis 07/07/2008   CONTUSION, HIP 05/13/2008   BENIGN PROSTATIC HYPERTROPHY, WITH OBSTRUCTION 02/01/2008   LOW BACK PAIN SYNDROME 02/01/2008   TESTOSTERONE DEFICIENCY 07/03/2007   HYPERLIPIDEMIA 07/03/2007   Essential hypertension 07/03/2007   MYOCARDIAL INFARCTION, HX OF 07/03/2007   Allergic rhinitis 07/03/2007   ACTINIC KERATOSIS 07/03/2007   COLONIC POLYPS, HX OF 07/03/2007   ACNE ROSACEA, HX OF 07/03/2007   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   Past Medical History:  Diagnosis Date   Age-related macular degeneration, dry, both eyes    Allergic    "24/7; 365 days/year; I'm  allergic to pollens, dust, all [place] grasses/trees, mold, mildue, cats, dogs" (01/07/2017)   Anal fissure    Asthma    sees [name]    Benign prostatic hypertrophy    (sees Dr. Denman George   CAD (coronary artery disease)    a. s/p CABG 2007. b. Cath 08/2016 - 4/4 patent grafts.   Carotid bruit    carotid u/s 10/10: 0.39% bilaterally   Chronic combined systolic and diastolic CHF (congestive heart failure) ([facility])    Complication of anesthesia  1980s   "w/anal cyst OR, he gave me a saddle block then put a narcotic in spinal cord; had a severe reaction to that" (01/07/2017)   Congestive heart failure (CHF) [facility])    ED (erectile dysfunction)    Family history of adverse reaction to anesthesia    "daughter wakes up during OR" (01/07/2017)   GERD (gastroesophageal reflux disease)    Gout    HTN (hypertension)    Hx of colonic polyps    (sees Dr. Henrene Pastor)   Hyperlipidemia    Hypothyroidism    Moderate to severe aortic stenosis    a. s/p TAVR 02/2017.   Myocardial infarction [facility]) ~ 2000   Obesity    Osteoarthritis    "was in my knees, hands" (01/07/2017 )   PAF (paroxysmal atrial fibrillation) ([place])    a. documented post TAVR.   Precancerous skin lesion    (sees Dr. 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   29 mm Edwards Sapien 3 transcatheter heart valve placed via percutaneous right transfemoral approach    Family History  Problem Relation Age of Onset   Heart attack Father 33   Allergic rhinitis Father    Asthma Father    Heart failure Mother 49   Uterine cancer Mother    Breast cancer Mother    Colon cancer Neg Hx    Esophageal cancer Neg Hx     Past Surgical History:  Procedure Laterality Date   ANUS SURGERY     "opened it back up cause it wouldn't heal; wound up w/a fissure" (01/07/2017)   BIV ICD INSERTION CRT-D N/A 05/02/2017   Procedure: BIV ICD INSERTION CRT-D;  Surgeon: Deboraha Sprang, MD;  Location: [place];  Service: Cardiovascular;  Laterality: N/A;   CARDIAC CATHETERIZATION  10/29/2005   CARDIAC CATHETERIZATION N/A 08/27/2016   Procedure: Right/Left Heart Cath and Coronary/Graft Angiography;  Surgeon: Burnell Blanks, MD;  Location: [facility];  Service: Cardiovascular;  Laterality: N/A;   CATARACT EXTRACTION W/ INTRAOCULAR LENS  IMPLANT, BILATERAL Bilateral     CATARACT EXTRACTION, BILATERAL  2012   COLONOSCOPY  06/30/2008   no repeats needed    COLONOSCOPY     had 3 or 4 in the past    CORONARY ARTERY BYPASS GRAFT  2007   "CABG X4"   CYST EXCISION PERINEAL  1980s   HAMMER TOE SURGERY Bilateral    JOINT REPLACEMENT     KNEE ARTHROPLASTY  07/30/2011   Procedure: COMPUTER ASSISTED TOTAL KNEE ARTHROPLASTY;  Surgeon: Meredith Pel;  Location: [place];  Service: Orthopedics;  Laterality: Left;  left total knee arthroplasty   MASTECTOMY SUBCUTANEOUS Bilateral    MULTIPLE TOOTH EXTRACTIONS     ORIF FINGER / THUMB FRACTURE Right ~ 1980   "repair of thumb injury"   PROSTATE BIOPSY     REPLACEMENT TOTAL  KNEE BILATERAL Bilateral 2012   TEE WITHOUT CARDIOVERSION N/A 03/04/2017   Procedure: TRANSESOPHAGEAL ECHOCARDIOGRAM (TEE);  Surgeon: Burnell Blanks, MD;  Location: [place];  Service: Open Heart Surgery;  Laterality: N/A;   TOTAL KNEE REVISION Right 05/18/2019   Procedure: RIGHT PATELLA REVISION/REMOVAL;  Surgeon: Meredith Pel, MD;  Location: [place] [facility];  Service: Orthopedics;  Laterality: Right;   TRANSCATHETER AORTIC VALVE REPLACEMENT, TRANSFEMORAL N/A 03/04/2017   Procedure: TRANSCATHETER AORTIC VALVE REPLACEMENT, TRANSFEMORAL;  Surgeon: Burnell Blanks, MD;  Location: [place];  Service: Open Heart Surgery;  Laterality: N/A;   Social History   Occupational History   Occupation: Clinical biochemist    Comment: builds malls  Tobacco Use   Smoking status: Former Smoker    Packs/day: 3.50    Years: 13.00    Pack years: 45.50    Types: Cigarettes    Quit date: 1963    Years since quitting: 57.7   Smokeless tobacco: Never Used  Substance and Sexual Activity   Alcohol use: Yes    Alcohol/week: 0.0 standard drinks    Comment: 01/07/2017 "might have 2 beers 2-3 times/month"   Drug use: No   Sexual activity: Never

## 2019-05-31 ENCOUNTER — Encounter: Payer: Self-pay | Admitting: Family Medicine

## 2019-05-31 ENCOUNTER — Other Ambulatory Visit: Payer: Self-pay

## 2019-05-31 ENCOUNTER — Other Ambulatory Visit (INDEPENDENT_AMBULATORY_CARE_PROVIDER_SITE_OTHER): Payer: Medicare Other

## 2019-05-31 ENCOUNTER — Ambulatory Visit (INDEPENDENT_AMBULATORY_CARE_PROVIDER_SITE_OTHER): Payer: Medicare Other | Admitting: *Deleted

## 2019-05-31 ENCOUNTER — Telehealth (INDEPENDENT_AMBULATORY_CARE_PROVIDER_SITE_OTHER): Payer: Medicare Other | Admitting: Family Medicine

## 2019-05-31 DIAGNOSIS — J309 Allergic rhinitis, unspecified: Secondary | ICD-10-CM

## 2019-05-31 DIAGNOSIS — M109 Gout, unspecified: Secondary | ICD-10-CM

## 2019-05-31 DIAGNOSIS — M25561 Pain in right knee: Secondary | ICD-10-CM | POA: Diagnosis not present

## 2019-05-31 DIAGNOSIS — E039 Hypothyroidism, unspecified: Secondary | ICD-10-CM

## 2019-05-31 LAB — T3, FREE: T3, Free: 2.9 pg/mL (ref 2.3–4.2)

## 2019-05-31 LAB — TSH: TSH: 3.69 u[IU]/mL (ref 0.35–4.50)

## 2019-05-31 LAB — T4, FREE: Free T4: 0.89 ng/dL (ref 0.60–1.60)

## 2019-05-31 NOTE — Progress Notes (Signed)
Virtual Visit via Video Note  I connected with the patient on 05/31/19 at  1:30 PM EDT by a video enabled telemedicine application and verified that I am speaking with the correct person using two identifiers.  Location patient: home Location provider:work or home office Persons participating in the virtual visit: patient, provider  I discussed the limitations of evaluation and management by telemedicine and the availability of in person appointments. The patient expressed understanding and agreed to proceed.   HPI: Here to follow up on gout. He had been diagnosed with gout in 2008, but it had not bothered him much the past few years. Now he has had 2 episodes of acute gout this year. Each time he has contacted Dr. Alphonzo Severance, his orthopedist, and he has provided him with Colchicine. This seems to help the acute flare to settle down. Dr. Marlou Sa suggested he see [place] to discuss possible preventive strategies. [name] seldom drinks alcohol and he avoids red meat. The most recent episode has involved the right hand and wrist.    ROS: See pertinent positives and negatives per HPI.  Past Medical History:  Diagnosis Date  . Age-related macular degeneration, dry, both eyes   . Allergic    "24/7; 365 days/year; I'm allergic to pollens, dust, all southern grasses/trees, mold, mildue, cats, dogs" (01/07/2017)  . Anal fissure   . Asthma    sees [name]   . Benign prostatic hypertrophy    (sees Dr. Denman George  . CAD (coronary artery disease)    a. s/p CABG 2007. b. Cath 08/2016 - 4/4 patent grafts.  . Carotid bruit    carotid u/s 10/10: 0.39% bilaterally  . Chronic combined systolic and diastolic CHF (congestive heart failure) ([place])   . Complication of anesthesia 1980s   "w/anal cyst OR, he gave me a saddle block then put a narcotic in spinal cord; had a severe reaction to that" (01/07/2017)  . Congestive heart failure (CHF) ([place])   . ED (erectile dysfunction)   . Family history of adverse reaction to  anesthesia    "daughter wakes up during OR" (01/07/2017)  . GERD (gastroesophageal reflux disease)   . Gout   . HTN (hypertension)   . Hx of colonic polyps    (sees Dr. Henrene Pastor)  . Hyperlipidemia   . Hypothyroidism   . Moderate to severe aortic stenosis    a. s/p TAVR 02/2017.  [name] Myocardial infarction ([place]) ~ 2000  . Obesity   . Osteoarthritis    "was in my knees, hands" (01/07/2017 )  . PAF (paroxysmal atrial fibrillation) ([place])    a. documented post TAVR.  [name] Precancerous skin lesion    (sees Dr. Allyson Sabal)  . Prostate cancer ([place]) dx'd ~ 2014  . 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 ANUS SURGERY     "opened it back up cause it wouldn't heal; wound up w/a fissure" (01/07/2017)  . BIV ICD INSERTION CRT-D N/A 05/02/2017   Procedure: BIV ICD INSERTION CRT-D;  Surgeon: Deboraha Sprang, MD;  Location: [facility];  Service: Cardiovascular;  Laterality: N/A;  . CARDIAC CATHETERIZATION  10/29/2005  . CARDIAC CATHETERIZATION N/A 08/27/2016   Procedure: Right/Left Heart Cath and Coronary/Graft Angiography;  Surgeon: Burnell Blanks, MD;  Location: [facility];  Service: Cardiovascular;  Laterality: N/A;  . CATARACT EXTRACTION W/ INTRAOCULAR LENS  IMPLANT, BILATERAL Bilateral   . CATARACT EXTRACTION, BILATERAL  2012  . COLONOSCOPY  06/30/2008   no repeats needed   . COLONOSCOPY     had 3 or 4 in the past   . CORONARY ARTERY BYPASS GRAFT  2007   "CABG X4"  . CYST EXCISION PERINEAL  1980s  . HAMMER TOE SURGERY Bilateral   . JOINT REPLACEMENT    . KNEE ARTHROPLASTY  07/30/2011   Procedure: COMPUTER ASSISTED TOTAL KNEE ARTHROPLASTY;  Surgeon: Meredith Pel;  Location: [facility];  Service: Orthopedics;  Laterality: Left;  left total knee arthroplasty  . MASTECTOMY SUBCUTANEOUS  Bilateral   . MULTIPLE TOOTH EXTRACTIONS    . ORIF FINGER / THUMB FRACTURE Right ~ 1980   "repair of thumb injury"  . PROSTATE BIOPSY    . REPLACEMENT TOTAL KNEE BILATERAL Bilateral 2012  . TEE WITHOUT CARDIOVERSION N/A 03/04/2017   Procedure: TRANSESOPHAGEAL ECHOCARDIOGRAM (TEE);  Surgeon: Burnell Blanks, MD;  Location: [facility];  Service: Open Heart Surgery;  Laterality: N/A;  . TOTAL KNEE REVISION Right 05/18/2019   Procedure: RIGHT PATELLA REVISION/REMOVAL;  Surgeon: Meredith Pel, MD;  Location: [facility];  Service: Orthopedics;  Laterality: Right;  . TRANSCATHETER AORTIC VALVE REPLACEMENT, TRANSFEMORAL N/A 03/04/2017   Procedure: TRANSCATHETER AORTIC VALVE REPLACEMENT, TRANSFEMORAL;  Surgeon: Burnell Blanks, MD;  Location: [facility];  Service: Open Heart Surgery;  Laterality: N/A;    Family History  Problem Relation Age of Onset  . Heart attack Father 3  . Allergic rhinitis Father   . Asthma Father   . Heart failure Mother 59  . Uterine cancer Mother   . Breast cancer Mother   . Colon cancer Neg Hx   . Esophageal cancer Neg Hx      Current Outpatient Medications:  .  aspirin 81 MG tablet, Take 1 tablet (81 mg total) by mouth daily., Disp: 30 tablet, Rfl: 0 .  atorvastatin (LIPITOR) 20 MG tablet, TAKE 1 TABLET ONCE DAILY. (Patient taking differently: Take 20 mg by mouth daily. ), Disp: 90 tablet, Rfl: 0 .  Azelastine-Fluticasone (DYMISTA) 137-50 MCG/ACT SUSP, USE 2 SPRAYS EACH NOSTRIL TWICE DAILY. (Patient taking differently: Place 1 spray into both nostrils 2 (two) times daily. USE 2 SPRAYS EACH NOSTRIL TWICE DAILY.), Disp: 23 g, Rfl: 11 .  benzonatate (TESSALON) 200 MG capsule, Take 200 mg by mouth 3 (three) times daily as needed for cough., Disp: , Rfl:  .  cetirizine (ZYRTEC) 10 MG tablet, Take 10 mg by mouth daily., Disp: , Rfl:  .  chlorpheniramine (CHLOR-TRIMETON) 4 MG tablet, Take 2 tablets (8 mg total) by mouth at bedtime., Disp: 60 tablet, Rfl: 12 .   colchicine 0.6 MG tablet, 1 po tid x 1 day 1 po bid x 2 days Then daily for 5 days, Disp: 30 tablet, Rfl: 0 .  dabigatran (PRADAXA) 150 MG CAPS capsule, Take 1 capsule (150 mg total) by mouth 2 (two) times daily., Disp: 180 capsule, Rfl: 1 .  EPINEPHrine 0.3 mg/0.3 mL IJ SOAJ injection, Inject 0.3 mLs (0.3 mg total) into the muscle as needed for anaphylaxis., Disp: 2 Device, Rfl: 1 .  famotidine (PEPCID) 10 MG tablet, Take 10 mg by mouth at bedtime., Disp: , Rfl:  .  furosemide (LASIX) 80 MG tablet, Take 1 tablet (80 mg total) by mouth 2 (two) times daily. TAKE ONE TABLET ONCE A DAY ALTERNATING WITH ONE TABLET TWICE A DAY. (  Patient taking differently: Take 80 mg by mouth See admin instructions. Take 80 mg by mouth once a day every other day, alternating with twice daily to coincide with potassium), Disp: 180 tablet, Rfl: 3 .  ipratropium (ATROVENT) 0.03 % nasal spray, Place 2 sprays into both nostrils every 6 (six) hours as needed for rhinitis., Disp: 30 mL, Rfl: 4 .  isosorbide mononitrate (IMDUR) 30 MG 24 hr tablet, TAKE 1 TABLET ONCE DAILY. (Patient taking differently: Take 30 mg by mouth daily. ), Disp: 90 tablet, Rfl: 0 .  Ketotifen Fumarate (ZADITOR OP), Apply 1 drop to eye daily as needed (itchy eyes). , Disp: , Rfl:  .  levothyroxine (SYNTHROID) 100 MCG tablet, Take 1 tablet (100 mcg total) by mouth daily., Disp: 90 tablet, Rfl: 3 .  losartan (COZAAR) 25 MG tablet, TAKE 1 TABLET EACH DAY. (Patient taking differently: Take 25 mg by mouth daily. ), Disp: 90 tablet, Rfl: 3 .  methocarbamol (ROBAXIN) 500 MG tablet, Take 1 tablet (500 mg total) by mouth every 8 (eight) hours as needed for muscle spasms., Disp: 30 tablet, Rfl: 0 .  metoprolol tartrate (LOPRESSOR) 25 MG tablet, Take 25 mg by mouth 2 (two) times daily., Disp: , Rfl:  .  montelukast (SINGULAIR) 10 MG tablet, Take 1 tablet (10 mg total) by mouth every evening., Disp: 90 tablet, Rfl: 4 .  Multiple Vitamins-Minerals (PRESERVISION AREDS PO),  Take 2 tablets by mouth every morning., Disp: , Rfl:  .  omeprazole (PRILOSEC) 20 MG capsule, TAKE (1) CAPSULE TWICE DAILY. (Patient taking differently: Take 20 mg by mouth 2 (two) times daily. ), Disp: 180 capsule, Rfl: 0 .  oxyCODONE (OXY IR/ROXICODONE) 5 MG immediate release tablet, Take 1 tablet (5 mg total) by mouth every 6 (six) hours as needed for moderate pain (pain score 4-6)., Disp: 35 tablet, Rfl: 0 .  potassium chloride SA (K-DUR) 20 MEQ tablet, Take 2 tablets by mouth twice daily alternating with 2 tablets daily. (Patient taking differently: Take 40 mEq by mouth See admin instructions. Take 40 meq by mouth once a day, alternating with twice daily to coincide with furosemide), Disp: 270 tablet, Rfl: 3 .  triamcinolone cream (KENALOG) 0.1 %, Apply 1 application topically daily as needed for dry skin., Disp: , Rfl:  .  valACYclovir (VALTREX) 500 MG tablet, Take 500 mg by mouth 2 (two) times daily as needed (cold sores)., Disp: , Rfl:   EXAM:  VITALS per patient if applicable:  GENERAL: alert, oriented, appears well and in no acute distress  HEENT: atraumatic, conjunttiva clear, no obvious abnormalities on inspection of external nose and ears  NECK: normal movements of the head and neck  LUNGS: on inspection no signs of respiratory distress, breathing rate appears normal, no obvious gross SOB, gasping or wheezing  CV: no obvious cyanosis  MS: moves all visible extremities without noticeable abnormality  PSYCH/NEURO: pleasant and cooperative, no obvious depression or anxiety, speech and thought processing grossly intact  ASSESSMENT AND PLAN: Gout. The acute episode is settling down nicely. For prevention he will come by the lab tomorrow to get a uric acid level drawn. Depending on this result we may start him on daily Allopurinol. Recheck prn.  [name], MD  Discussed the following assessment and plan:  Acute gout of left hand, unspecified cause - Plan: Uric acid     I  discussed the assessment and treatment plan with the patient. The patient was provided an opportunity to ask questions and all were answered.  The patient agreed with the plan and demonstrated an understanding of the instructions.   The patient was advised to call back or seek an in-person evaluation if the symptoms worsen or if the condition fails to improve as anticipated.

## 2019-06-01 ENCOUNTER — Other Ambulatory Visit: Payer: Self-pay

## 2019-06-01 ENCOUNTER — Ambulatory Visit (INDEPENDENT_AMBULATORY_CARE_PROVIDER_SITE_OTHER): Payer: Medicare Other

## 2019-06-01 ENCOUNTER — Other Ambulatory Visit (INDEPENDENT_AMBULATORY_CARE_PROVIDER_SITE_OTHER): Payer: Medicare Other

## 2019-06-01 DIAGNOSIS — M109 Gout, unspecified: Secondary | ICD-10-CM

## 2019-06-01 DIAGNOSIS — Z95810 Presence of automatic (implantable) cardiac defibrillator: Secondary | ICD-10-CM | POA: Diagnosis not present

## 2019-06-01 DIAGNOSIS — I5020 Unspecified systolic (congestive) heart failure: Secondary | ICD-10-CM

## 2019-06-01 LAB — URIC ACID: Uric Acid, Serum: 5.9 mg/dL (ref 4.0–7.8)

## 2019-06-02 DIAGNOSIS — M25561 Pain in right knee: Secondary | ICD-10-CM | POA: Diagnosis not present

## 2019-06-04 NOTE — Progress Notes (Signed)
EPIC Encounter for ICM Monitoring  Patient Name: Justin [name] is a 85 y.o. male Date: 06/04/2019 Primary Care Physican: Laurey Morale, MD Primary Cardiologist:[place] Electrophysiologist:K[facility] Bi-V Pacing:75% 06/04/2019 Weight: 254 lbs - 255 lbs  AT/AF                   1 Time in AT/AF  <0.1 hr/day (<0.1%) Longest AT/AF  17 seconds   Spoke with patient and reports feeling well at this time.  Denies fluid symptoms.   He is recovering from out patient knee surgery done on 05/18/2019.  Optivol thoracic impedancenormal but was suggestive of possible fluid accumulation during time of outpatient knee surgery.  Prescribed: Furosemide80 mgtaketake 1 tablet once a day alternating with one twice a day.  Recommendations: No changes and encouraged to call if experiencing any fluid symptoms.  Follow-up plan: ICM clinic phone appointment on 07/08/2019.   91 day device clinic remote transmission 07/07/2019.  Office appt 07/02/2019 with Dr. Angelena Form.    Copy of ICM check sent to Dr. [name].   3 month ICM trend: 06/01/2019    1 Year ICM trend:       [name], RN 06/04/2019 2:24 PM

## 2019-06-08 DIAGNOSIS — M25561 Pain in right knee: Secondary | ICD-10-CM | POA: Diagnosis not present

## 2019-06-10 DIAGNOSIS — M25561 Pain in right knee: Secondary | ICD-10-CM | POA: Diagnosis not present

## 2019-06-14 DIAGNOSIS — M25561 Pain in right knee: Secondary | ICD-10-CM | POA: Diagnosis not present

## 2019-06-14 DIAGNOSIS — H353221 Exudative age-related macular degeneration, left eye, with active choroidal neovascularization: Secondary | ICD-10-CM | POA: Diagnosis not present

## 2019-06-15 ENCOUNTER — Telehealth: Payer: Self-pay

## 2019-06-15 MED ORDER — METOPROLOL SUCCINATE ER 50 MG PO TB24: 50.0000 mg | ORAL_TABLET | Freq: Every day | ORAL | 3 refills | Status: DC

## 2019-06-15 NOTE — Telephone Encounter (Signed)
Reviewed echo results with pt. Per Dr. [name], he wanted input for med changes from Dr. Angelena Form.   Discussed medication change per Dr. Angelena Form with pt. He has agreed to d/c lopressor and begin Toprol XL 50mg  qd. He will monitor his bp and discuss with Dr. Angelena Form during his next office visit.

## 2019-06-15 NOTE — Telephone Encounter (Signed)
-----   Message from Burnell Blanks, MD sent at 06/13/2019 11:06 AM EDT ----- Regarding: RE: Med adjustment We can change his metoprolol to Toprol 50 mg daily. GIven soft BP at last office visit, he may not tolerate the Coreg as well. Thanks, Gerald Stabs ----- Message ----- From: Dollene Primrose, RN Sent: 06/11/2019  12:57 PM EDT To: Deboraha Sprang, MD, Burnell Blanks, MD Subject: Med adjustment                                 Hello Dr. [name] and Dr. Julianne Handler,  Did you guys decided on any med changes based off this pt's echo results?  From Dr. [name]: "Should we change his BB to succinate or carvedilol with low EF "  Thanks!  Austine Wiedeman

## 2019-06-16 ENCOUNTER — Ambulatory Visit (INDEPENDENT_AMBULATORY_CARE_PROVIDER_SITE_OTHER): Payer: Medicare Other

## 2019-06-16 DIAGNOSIS — M25561 Pain in right knee: Secondary | ICD-10-CM | POA: Diagnosis not present

## 2019-06-16 DIAGNOSIS — J309 Allergic rhinitis, unspecified: Secondary | ICD-10-CM | POA: Diagnosis not present

## 2019-06-18 DIAGNOSIS — M25561 Pain in right knee: Secondary | ICD-10-CM | POA: Diagnosis not present

## 2019-06-21 ENCOUNTER — Ambulatory Visit (INDEPENDENT_AMBULATORY_CARE_PROVIDER_SITE_OTHER): Payer: Medicare Other | Admitting: *Deleted

## 2019-06-21 ENCOUNTER — Other Ambulatory Visit: Payer: Self-pay | Admitting: Primary Care

## 2019-06-21 DIAGNOSIS — M25561 Pain in right knee: Secondary | ICD-10-CM | POA: Diagnosis not present

## 2019-06-21 DIAGNOSIS — J309 Allergic rhinitis, unspecified: Secondary | ICD-10-CM

## 2019-06-23 ENCOUNTER — Other Ambulatory Visit: Payer: Self-pay | Admitting: Internal Medicine

## 2019-06-23 DIAGNOSIS — M25561 Pain in right knee: Secondary | ICD-10-CM | POA: Diagnosis not present

## 2019-06-25 DIAGNOSIS — M25561 Pain in right knee: Secondary | ICD-10-CM | POA: Diagnosis not present

## 2019-06-28 DIAGNOSIS — M25561 Pain in right knee: Secondary | ICD-10-CM | POA: Diagnosis not present

## 2019-06-30 ENCOUNTER — Other Ambulatory Visit: Payer: Self-pay | Admitting: Family Medicine

## 2019-06-30 DIAGNOSIS — M25561 Pain in right knee: Secondary | ICD-10-CM | POA: Diagnosis not present

## 2019-07-02 ENCOUNTER — Encounter: Payer: Self-pay | Admitting: Cardiovascular Disease

## 2019-07-02 ENCOUNTER — Ambulatory Visit: Payer: Medicare Other | Admitting: Cardiovascular Disease

## 2019-07-02 ENCOUNTER — Other Ambulatory Visit: Payer: Self-pay

## 2019-07-02 VITALS — BP 128/72 | HR 89 | Ht 66.0 in | Wt 262.2 lb

## 2019-07-02 DIAGNOSIS — I5020 Unspecified systolic (congestive) heart failure: Secondary | ICD-10-CM | POA: Diagnosis not present

## 2019-07-02 DIAGNOSIS — I480 Paroxysmal atrial fibrillation: Secondary | ICD-10-CM | POA: Diagnosis not present

## 2019-07-02 DIAGNOSIS — I10 Essential (primary) hypertension: Secondary | ICD-10-CM

## 2019-07-02 DIAGNOSIS — I25118 Atherosclerotic heart disease of native coronary artery with other forms of angina pectoris: Secondary | ICD-10-CM | POA: Diagnosis not present

## 2019-07-02 DIAGNOSIS — I255 Ischemic cardiomyopathy: Secondary | ICD-10-CM

## 2019-07-02 DIAGNOSIS — Z952 Presence of prosthetic heart valve: Secondary | ICD-10-CM

## 2019-07-02 NOTE — Progress Notes (Signed)
Chief Complaint  Patient presents with  . Follow-up    CAD    History of Present Illness: 85 yo male with history of HTN, HLD, severe aortic stenosis s/p TAVR, PAF, chronic diastolic CHF and CAD s/p 4V CABG, syncope, junctional bradycardia, PVCS, non-sustained VT and LBBB s/p ICD implantationwho is here today for cardiac follow up. He is known to have CAD s/p 4V CABG in March 2007 (LIMA to LAD, SVG to Diagonal, SVG to OM, SVG to RCA). Last cardiac cath in January 2018 with 4/4 patent bypass grafts. He had progression of his aortic stenosis with worsened dyspnea and acute on chronic CHF in the spring of 2018. Echo May 2018 with mildly reduced LV systolic function, [id number]. His aortic stenosis was severe. He underwent TAVR on 03/04/17 with a 29 mm Edwards Sapien 3 valve from the right transfemoral approach. He developed atrial fibrillation following his procedure and was placed on IV amiodarone with conversion to sinus rhythm. He has been on ASA and Eliquis (Xarelto had been used initially but pt thought it caused him to have shoulder pain). Follow up visit in our office 03/19/17 and pt noted to have irregular rhythm with intraventricular conduction delay and was felt by our EP team to represent 2:1 conduction of competing fast and slow pathways. Cardiac monitor with sinus rhythm with periods of junctional rhythm, frequent PVCs, frequent PACs and runs of a wide complex tachycardia. ICD was implanted on 05/03/17. Echo September 2020 with LVEF=35-40%.   He is here today for follow up. The patient denies any chest pain, dyspnea, palpitations, lower extremity edema, orthopnea, PND, dizziness, near syncope or syncope.   Primary Care Physician: Laurey Morale, MD  Past Medical History:  Diagnosis Date  . Age-related macular degeneration, dry, both eyes   . Allergic    "24/7; 365 days/year; I'm allergic to pollens, dust, all southern grasses/trees, mold, mildue, cats, dogs" (01/07/2017)  . Anal fissure    . Asthma    sees [name]   . Benign prostatic hypertrophy    (sees Dr. Denman George  . CAD (coronary artery disease)    a. s/p CABG 2007. b. Cath 08/2016 - 4/4 patent grafts.  . Carotid bruit    carotid u/s 10/10: 0.39% bilaterally  . Chronic combined systolic and diastolic CHF (congestive heart failure) ([place])   . Complication of anesthesia 1980s   "w/anal cyst OR, he gave me a saddle block then put a narcotic in spinal cord; had a severe reaction to that" (01/07/2017)  . Congestive heart failure (CHF) ([place])   . ED (erectile dysfunction)   . Family history of adverse reaction to anesthesia    "daughter wakes up during OR" (01/07/2017)  . GERD (gastroesophageal reflux disease)   . Gout   . HTN (hypertension)   . Hx of colonic polyps    (sees Dr. Henrene Pastor)  . Hyperlipidemia   . Hypothyroidism   . Moderate to severe aortic stenosis    a. s/p TAVR 02/2017.  [name] Myocardial infarction ([place]) ~ 2000  . Obesity   . Osteoarthritis    "was in my knees, hands" (01/07/2017 )  . PAF (paroxysmal atrial fibrillation) ([place])    a. documented post TAVR.  [name] Precancerous skin lesion    (sees Dr. Allyson Sabal)  . Prostate cancer ([place]) dx'd ~ 2014  . 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   29 mm Edwards Sapien 3 transcatheter heart valve placed via percutaneous  right transfemoral approach    Past Surgical History:  Procedure Laterality Date  . ANUS SURGERY     "opened it back up cause it wouldn't heal; wound up w/a fissure" (01/07/2017)  . BIV ICD INSERTION CRT-D N/A 05/02/2017   Procedure: BIV ICD INSERTION CRT-D;  Surgeon: Deboraha Sprang, MD;  Location: [facility];  Service: Cardiovascular;  Laterality: N/A;  . CARDIAC CATHETERIZATION  10/29/2005  . CARDIAC CATHETERIZATION N/A 08/27/2016   Procedure: Right/Left Heart Cath and Coronary/Graft Angiography;  Surgeon: Burnell Blanks, MD;  Location: [facility];  Service: Cardiovascular;   Laterality: N/A;  . CATARACT EXTRACTION W/ INTRAOCULAR LENS  IMPLANT, BILATERAL Bilateral   . CATARACT EXTRACTION, BILATERAL  2012  . COLONOSCOPY  06/30/2008   no repeats needed   . COLONOSCOPY     had 3 or 4 in the past   . CORONARY ARTERY BYPASS GRAFT  2007   "CABG X4"  . CYST EXCISION PERINEAL  1980s  . HAMMER TOE SURGERY Bilateral   . JOINT REPLACEMENT    . KNEE ARTHROPLASTY  07/30/2011   Procedure: COMPUTER ASSISTED TOTAL KNEE ARTHROPLASTY;  Surgeon: Meredith Pel;  Location: [facility];  Service: Orthopedics;  Laterality: Left;  left total knee arthroplasty  . MASTECTOMY SUBCUTANEOUS Bilateral   . MULTIPLE TOOTH EXTRACTIONS    . ORIF FINGER / THUMB FRACTURE Right ~ 1980   "repair of thumb injury"  . PROSTATE BIOPSY    . REPLACEMENT TOTAL KNEE BILATERAL Bilateral 2012  . TEE WITHOUT CARDIOVERSION N/A 03/04/2017   Procedure: TRANSESOPHAGEAL ECHOCARDIOGRAM (TEE);  Surgeon: Burnell Blanks, MD;  Location: [facility];  Service: Open Heart Surgery;  Laterality: N/A;  . TOTAL KNEE REVISION Right 05/18/2019   Procedure: RIGHT PATELLA REVISION/REMOVAL;  Surgeon: Meredith Pel, MD;  Location: [facility];  Service: Orthopedics;  Laterality: Right;  . TRANSCATHETER AORTIC VALVE REPLACEMENT, TRANSFEMORAL N/A 03/04/2017   Procedure: TRANSCATHETER AORTIC VALVE REPLACEMENT, TRANSFEMORAL;  Surgeon: Burnell Blanks, MD;  Location: [facility];  Service: Open Heart Surgery;  Laterality: N/A;    Current Outpatient Medications  Medication Sig Dispense Refill  . aspirin 81 MG tablet Take 1 tablet (81 mg total) by mouth daily. 30 tablet 0  . atorvastatin (LIPITOR) 20 MG tablet TAKE 1 TABLET ONCE DAILY. (Patient taking differently: Take 20 mg by mouth daily. ) 90 tablet 0  . Azelastine-Fluticasone (DYMISTA) 137-50 MCG/ACT SUSP USE 2 SPRAYS EACH NOSTRIL TWICE DAILY. (Patient taking differently: Place 1 spray into both nostrils 2 (two) times daily. USE 2 SPRAYS EACH NOSTRIL TWICE DAILY.) 23 g 11  .  benzonatate (TESSALON) 200 MG capsule TAKE 1 CAPSULE EVERY 8 HOURS AS NEEDED FOR COUGH. 45 capsule 0  . cetirizine (ZYRTEC) 10 MG tablet Take 10 mg by mouth daily.    . chlorpheniramine (CHLOR-TRIMETON) 4 MG tablet Take 2 tablets (8 mg total) by mouth at bedtime. 60 tablet 12  . colchicine 0.6 MG tablet 1 po tid x 1 day 1 po bid x 2 days Then daily for 5 days 30 tablet 0  . EPINEPHrine 0.3 mg/0.3 mL IJ SOAJ injection Inject 0.3 mLs (0.3 mg total) into the muscle as needed for anaphylaxis. 2 Device 1  . famotidine (PEPCID) 10 MG tablet Take 10 mg by mouth at bedtime.    . furosemide (LASIX) 80 MG tablet Take 1 tablet (80 mg total) by mouth 2 (two) times daily. TAKE ONE TABLET ONCE A DAY ALTERNATING WITH ONE TABLET TWICE  A DAY. (Patient taking differently: Take 80 mg by mouth See admin instructions. Take 80 mg by mouth once a day every other day, alternating with twice daily to coincide with potassium) 180 tablet 3  . ipratropium (ATROVENT) 0.03 % nasal spray Place 2 sprays into both nostrils every 6 (six) hours as needed for rhinitis. 30 mL 4  . isosorbide mononitrate (IMDUR) 30 MG 24 hr tablet TAKE 1 TABLET ONCE DAILY. (Patient taking differently: Take 30 mg by mouth daily. ) 90 tablet 0  . Ketotifen Fumarate (ZADITOR OP) Apply 1 drop to eye daily as needed (itchy eyes).     [name] levothyroxine (SYNTHROID) 100 MCG tablet Take 1 tablet (100 mcg total) by mouth daily. 90 tablet 3  . losartan (COZAAR) 25 MG tablet TAKE 1 TABLET EACH DAY. (Patient taking differently: Take 25 mg by mouth daily. ) 90 tablet 3  . methocarbamol (ROBAXIN) 500 MG tablet Take 1 tablet (500 mg total) by mouth every 8 (eight) hours as needed for muscle spasms. 30 tablet 0  . metoprolol succinate (TOPROL XL) 50 MG 24 hr tablet Take 1 tablet (50 mg total) by mouth daily. Take with or immediately following a meal. 90 tablet 3  . montelukast (SINGULAIR) 10 MG tablet Take 1 tablet (10 mg total) by mouth every evening. 90 tablet 4  .  Multiple Vitamins-Minerals (PRESERVISION AREDS PO) Take 2 tablets by mouth every morning.    [name] omeprazole (PRILOSEC) 20 MG capsule TAKE (1) CAPSULE TWICE DAILY. 180 capsule 0  . potassium chloride SA (K-DUR) 20 MEQ tablet Take 2 tablets by mouth twice daily alternating with 2 tablets daily. (Patient taking differently: Take 40 mEq by mouth See admin instructions. Take 40 meq by mouth once a day, alternating with twice daily to coincide with furosemide) 270 tablet 3  . PRADAXA 150 MG CAPS capsule TAKE (1) CAPSULE TWICE DAILY. 180 capsule 0  . triamcinolone cream (KENALOG) 0.1 % Apply 1 application topically daily as needed for dry skin.    . valACYclovir (VALTREX) 500 MG tablet Take 500 mg by mouth 2 (two) times daily as needed (cold sores).     No current facility-administered medications for this visit.     Allergies  Allergen Reactions  . Peanut-Containing Drug Products Anaphylaxis  . Sulfonamide Derivatives Anaphylaxis  . Amlodipine Swelling    Swelling in ankles  . Eliquis [Apixaban] Other (See Comments)    Back/hip pain  . Lisinopril Cough    cough  . Xarelto [Rivaroxaban] Other (See Comments)    Back/hip pain    Social History   Socioeconomic History  . Marital status: Married    Spouse name: Not on file  . Number of children: 3  . Years of education: Not on file  . Highest education level: Not on file  Occupational History  . Occupation: Clinical biochemist    Comment: builds [employer]  . Financial resource strain: Not on file  . Food insecurity    Worry: Not on file    Inability: Not on file  . Transportation needs    Medical: Not on file    Non-medical: Not on file  Tobacco Use  . Smoking status: Former Smoker    Packs/day: 3.50    Years: 13.00    Pack years: 45.50    Types: Cigarettes    Quit date: 1963    Years since quitting: 57.8  . Smokeless tobacco: Never Used  Substance and Sexual Activity  . Alcohol use:  Yes    Alcohol/week: 0.0 standard  drinks    Comment: 01/07/2017 "might have 2 beers 2-3 times/month"  . Drug use: No  . Sexual activity: Never  Lifestyle  . Physical activity    Days per week: Not on file    Minutes per session: Not on file  . Stress: Not on file  Relationships  . Social Herbalist on phone: Not on file    Gets together: Not on file    Attends religious service: Not on file    Active member of club or organization: Not on file    Attends meetings of clubs or organizations: Not on file    Relationship status: Not on file  . Intimate partner violence    Fear of current or ex partner: Not on file    Emotionally abused: Not on file    Physically abused: Not on file    Forced sexual activity: Not on file  Other Topics Concern  . Not on file  Social History Narrative   FH of CAD, Male 1st degree relative less than age 75.    Family History  Problem Relation Age of Onset  . Heart attack Father 71  . Allergic rhinitis Father   . Asthma Father   . Heart failure Mother 60  . Uterine cancer Mother   . Breast cancer Mother   . Colon cancer Neg Hx   . Esophageal cancer Neg Hx     Review of Systems:  As stated in the HPI and otherwise negative.   BP 128/72   Pulse 89   Ht 5\' 6"  (1.676 m)   Wt 262 lb 3.2 oz (118.9 kg)   SpO2 95%   BMI 42.32 kg/m   Physical Examination:  General: Well developed, well nourished, NAD  HEENT: OP clear, mucus membranes moist  SKIN: warm, dry. No rashes. Neuro: No focal deficits  Musculoskeletal: Muscle strength 5/5 all ext  Psychiatric: Mood and affect normal  Neck: No JVD, no carotid bruits, no thyromegaly, no lymphadenopathy.  Lungs:Clear bilaterally, no wheezes, rhonci, crackles Cardiovascular: Regular rate and rhythm. No murmurs, gallops or rubs. Abdomen:Soft. Bowel sounds present. Non-tender.  Extremities: No lower extremity edema. Pulses are 2 + in the bilateral DP/PT.  Echo September 2020:  1. The left ventricle has moderately reduced  systolic function, with an ejection fraction of 35-40%. The cavity size was normal. There is moderately increased left ventricular wall thickness. Left ventricular diastolic Doppler parameters are consistent  with impaired relaxation. Indeterminate filling pressures.  2. Severe hypokinesis of the left ventricular, entire anterior wall, anteroseptal wall and apical segment.  3. The right ventricle has moderately reduced systolic function. The cavity was normal. There is no increase in right ventricular wall thickness.  4. Left atrial size was mildly dilated.  5. Right atrial size was mildly dilated.  6. The mitral valve is abnormal. Mild thickening of the mitral valve leaflet. There is mild to moderate mitral annular calcification present.  7. The tricuspid valve is grossly normal.  8. A 71mm an Wende Crease Sapien bioprosthetic aortic valve (TAVR) valve is present in the aortic position. Procedure Date: 03/13/2019 Normal aortic valve prosthesis.  9. No stenosis of the aortic valve. 10. The aorta is abnormal unless otherwise noted. 11. There is mild dilatation of the ascending aorta measuring 40 mm. 12. When compared to the prior study: 02/06/2018: LVEF 40-45%, TAVR mean gradient 9 mmHg, aorta 3.9 cm.   Cardiac cath 08/27/16: Diagnostic  Diagram          EKG:  EKG is not ordered today The ekg ordered today demonstrates  Recent Labs: 03/11/2019: ALT 20 05/12/2019: BUN 17; Creatinine, Ser 0.82; Hemoglobin 13.3; Platelets 205; Potassium 3.5; Sodium 137 05/31/2019: TSH 3.69   Lipid Panel    Component Value Date/Time   CHOL 115 03/11/2019 0858   CHOL 171 08/11/2018 0735   TRIG 170.0 (H) 03/11/2019 0858   TRIG 143 06/30/2006 1132   HDL 31.30 (L) 03/11/2019 0858   HDL 34 (L) 08/11/2018 0735   CHOLHDL 4 03/11/2019 0858   VLDL 34.0 03/11/2019 0858   LDLCALC 49 03/11/2019 0858   LDLCALC 73 08/11/2018 0735     Wt Readings from Last 3 Encounters:  07/02/19 262 lb 3.2 oz (118.9 kg)   05/18/19 262 lb 12.8 oz (119.2 kg)  05/12/19 262 lb 12.8 oz (119.2 kg)     Other studies Reviewed: Additional studies/ records that were reviewed today include:  Review of the above records demonstrates:    Assessment and Plan:   1. CAD s/p CABG with angina: No chest pain. Continue ASA, statin, beta blocker and Imdur. .    2. Aortic valve stenosis: He is s/p TAVR in July 2018. Continue SBE prophylaxis as indicated. He is on Pradaxa.   3. HTN: BP controlled. No changes today  4. HLD: Lipids at goal. Continue statin  5. Chronic systolic and diastolic CHF: Weight is stable. Continue Lasix  6. Atrial fibrillation, paroxysmal: Sinus today. Continue Pradaxa and beta blocker.   7. Ischemic cardiomyopathy: ICD in place. Continue Losartan and Toprol   Current medicines are reviewed at length with the patient today.  The patient does not have concerns regarding medicines.  The following changes have been made:  no change  Labs/ tests ordered today include:   No orders of the defined types were placed in this encounter.  Disposition:  Follow up with me in 6 months.   Signed, [name], MD 07/02/2019 8:50 AM    [facility] [address], [place], [place]  [address] Phone: [phone]; Fax: [phone]

## 2019-07-02 NOTE — Patient Instructions (Signed)
Medication Instructions:  Your physician recommends that you continue on your current medications as directed. Please refer to the Current Medication list given to you today.  *If you need a refill on your cardiac medications before your next appointment, please call your pharmacy*  Lab Work: None ordered  If you have labs (blood work) drawn today and your tests are completely normal, you will receive your results only by: . MyChart Message (if you have MyChart) OR . A paper copy in the mail If you have any lab test that is abnormal or we need to change your treatment, we will call you to review the results.  Testing/Procedures: None ordered  Follow-Up: At [facility], you and your health needs are our priority.  As part of our continuing mission to provide you with exceptional heart care, we have created designated Provider Care Teams.  These Care Teams include your primary Cardiologist (physician) and Advanced Practice Providers (APPs -  Physician Assistants and Nurse Practitioners) who all work together to provide you with the care you need, when you need it.  Your next appointment:   6 month(s)  The format for your next appointment:   In Person  Provider:   You may see Christopher McAlhany, MD or one of the following Advanced Practice Providers on your designated Care Team:    Dayna Dunn, PA-C  Michele Lenze, PA-C   Other Instructions   

## 2019-07-05 DIAGNOSIS — M25561 Pain in right knee: Secondary | ICD-10-CM | POA: Diagnosis not present

## 2019-07-07 ENCOUNTER — Ambulatory Visit (INDEPENDENT_AMBULATORY_CARE_PROVIDER_SITE_OTHER): Payer: Medicare Other | Admitting: *Deleted

## 2019-07-07 DIAGNOSIS — J309 Allergic rhinitis, unspecified: Secondary | ICD-10-CM | POA: Diagnosis not present

## 2019-07-07 DIAGNOSIS — I5033 Acute on chronic diastolic (congestive) heart failure: Secondary | ICD-10-CM

## 2019-07-07 DIAGNOSIS — M25561 Pain in right knee: Secondary | ICD-10-CM | POA: Diagnosis not present

## 2019-07-07 DIAGNOSIS — I428 Other cardiomyopathies: Secondary | ICD-10-CM

## 2019-07-07 LAB — CUP PACEART REMOTE DEVICE CHECK
Battery Remaining Longevity: 39 mo
Battery Voltage: 2.96 V
Brady Statistic AP VP Percent: 1.52 %
Brady Statistic AP VS Percent: 0.18 %
Brady Statistic AS VP Percent: 83.34 %
Brady Statistic AS VS Percent: 14.96 %
Brady Statistic RA Percent Paced: 1.64 %
Brady Statistic RV Percent Paced: 72.57 %
Date Time Interrogation Session: 20201111051759
HighPow Impedance: 75 Ohm
Implantable Lead Implant Date: 20180907
Implantable Lead Implant Date: 20180907
Implantable Lead Implant Date: 20180907
Implantable Lead Location: 753858
Implantable Lead Location: 753859
Implantable Lead Location: 753860
Implantable Lead Model: 4396
Implantable Lead Model: 5076
Implantable Pulse Generator Implant Date: 20180907
Lead Channel Impedance Value: 323 Ohm
Lead Channel Impedance Value: 342 Ohm
Lead Channel Impedance Value: 380 Ohm
Lead Channel Impedance Value: 456 Ohm
Lead Channel Impedance Value: 532 Ohm
Lead Channel Impedance Value: 817 Ohm
Lead Channel Pacing Threshold Amplitude: 0.75 V
Lead Channel Pacing Threshold Amplitude: 1.125 V
Lead Channel Pacing Threshold Amplitude: 1.125 V
Lead Channel Pacing Threshold Pulse Width: 0.4 ms
Lead Channel Pacing Threshold Pulse Width: 0.4 ms
Lead Channel Pacing Threshold Pulse Width: 1 ms
Lead Channel Sensing Intrinsic Amplitude: 11.875 mV
Lead Channel Sensing Intrinsic Amplitude: 11.875 mV
Lead Channel Sensing Intrinsic Amplitude: 2.125 mV
Lead Channel Sensing Intrinsic Amplitude: 2.125 mV
Lead Channel Setting Pacing Amplitude: 1.5 V
Lead Channel Setting Pacing Amplitude: 2 V
Lead Channel Setting Pacing Amplitude: 2.5 V
Lead Channel Setting Pacing Pulse Width: 0.4 ms
Lead Channel Setting Pacing Pulse Width: 1 ms
Lead Channel Setting Sensing Sensitivity: 0.3 mV

## 2019-07-08 ENCOUNTER — Ambulatory Visit (INDEPENDENT_AMBULATORY_CARE_PROVIDER_SITE_OTHER): Payer: Medicare Other

## 2019-07-08 DIAGNOSIS — I5020 Unspecified systolic (congestive) heart failure: Secondary | ICD-10-CM

## 2019-07-08 DIAGNOSIS — Z95810 Presence of automatic (implantable) cardiac defibrillator: Secondary | ICD-10-CM | POA: Diagnosis not present

## 2019-07-09 ENCOUNTER — Ambulatory Visit (INDEPENDENT_AMBULATORY_CARE_PROVIDER_SITE_OTHER): Payer: Medicare Other | Admitting: Orthopedic Surgery

## 2019-07-09 ENCOUNTER — Other Ambulatory Visit: Payer: Self-pay

## 2019-07-09 ENCOUNTER — Encounter: Payer: Self-pay | Admitting: Orthopedic Surgery

## 2019-07-09 DIAGNOSIS — Z96653 Presence of artificial knee joint, bilateral: Secondary | ICD-10-CM

## 2019-07-09 DIAGNOSIS — M25561 Pain in right knee: Secondary | ICD-10-CM | POA: Diagnosis not present

## 2019-07-09 NOTE — Progress Notes (Signed)
EPIC Encounter for ICM Monitoring  Patient Name: TARENCE STUEDEMANN is a 85 y.o. male Date: 07/09/2019 Primary Care Physican: Laurey Morale, MD Primary Cardiologist:McAlhany Electrophysiologist:Klein Bi-V Pacing:74% 06/04/2019 Weight: 254 lbs - 255 lbs  AT/AF                  1 Time in AT/AF   <0.1 hr/day (<0.1%) Longest AT/AF   45 seconds VT-NS (>4 beats, >200 bpm)  2   Spoke with patient and he is doing well.  Denies any fluid symptoms.  Optivol thoracic impedancenormal.  Prescribed: Furosemide80 mgtaketake 1 tablet once a day alternating with one twice a day.  Recommendations: No changes and encouraged to call if experiencing any fluid symptoms.  Follow-up plan: ICM clinic phone appointment on 08/09/2019.   91 day device clinic remote transmission 10/06/2019.    Copy of ICM check sent to Dr. [name].   3 month ICM trend: 07/07/2019    1 Year ICM trend:       [name], RN 07/09/2019 1:55 PM

## 2019-07-09 NOTE — Progress Notes (Signed)
Post-Op Visit Note   Patient: Justin [name]           Date of Birth: 15-Jan-1934           MRN: [id number] Visit Date: 07/09/2019 PCP: Laurey Morale, MD   Assessment & Plan:  Chief Complaint:  Chief Complaint  Patient presents with  . Follow-up   Visit Diagnoses:  1. Presence of artificial knee joint, bilateral     Plan: Patient is now about 6 weeks out right patellar revision.  Has been doing well.  On exam he has good range of motion of that right knee with no effusion.  Incision is intact.  He is able to go up and down stairs.  Plan at this time is continue for at least 6 more weeks on quad strengthening and gait training.  I think that is helping him to achieve more walking endurance and strengthening in the leg.  I will see him back as needed.  Follow-Up Instructions: No follow-ups on file.   Orders:  No orders of the defined types were placed in this encounter.  No orders of the defined types were placed in this encounter.   Imaging: No results found.  PMFS History: Patient Active Problem List   Diagnosis Date Noted  . Loosening of knee joint prosthesis ([place]) 05/18/2019  . Surgical counseling visit 04/30/2019  . Cough 04/30/2019  . Gastroesophageal reflux disease 04/30/2019  . Bilateral impacted cerumen 04/30/2019  . S/P ICD (internal cardiac defibrillator) procedure 05/03/2017  . NICM (nonischemic cardiomyopathy) ([place]) 05/02/2017  . 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 Severe aortic valve stenosis 03/04/2017  . Hypokalemia due to loss of potassium with diuresis 01/10/2017  . Aortic stenosis 01/10/2017  . Systolic heart failure ([place]) 01/10/2017  . Acute on chronic diastolic CHF (congestive heart failure) ([place]) 01/07/2017  . Hypothyroidism 12/23/2016  . Prostate cancer ([place])   . 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 Edema of both legs 09/01/2014  . Asthma, intermittent 09/01/2014  . Aortic valve disorders 01/01/2011  .  Coronary artery disease involving native coronary artery of native heart without angina pectoris   . ACUTE BRONCHITIS 09/06/2010  . GOUT 09/12/2009  . CELLULITIS, FOOT 08/15/2009  . CAD, AUTOLOGOUS BYPASS GRAFT 05/10/2009  . MURMUR 05/08/2009  . BRUIT 05/08/2009  . MUSCLE CRAMPS 12/12/2008  . KNEE SPRAIN 09/21/2008  . BENIGN PROSTATIC HYPERTROPHY 07/07/2008  . Osteoarthritis 07/07/2008  . CONTUSION, HIP 05/13/2008  . BENIGN PROSTATIC HYPERTROPHY, WITH OBSTRUCTION 02/01/2008  . LOW BACK PAIN SYNDROME 02/01/2008  . TESTOSTERONE DEFICIENCY 07/03/2007  . HYPERLIPIDEMIA 07/03/2007  . Essential hypertension 07/03/2007  . MYOCARDIAL INFARCTION, HX OF 07/03/2007  . Allergic rhinitis 07/03/2007  . ACTINIC KERATOSIS 07/03/2007  . COLONIC POLYPS, HX OF 07/03/2007  . ACNE ROSACEA, HX OF 07/03/2007  . 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   Past Medical History:  Diagnosis Date  . Age-related macular degeneration, dry, both eyes   . Allergic    "24/7; 365 days/year; I'm allergic to pollens, dust, all southern grasses/trees, mold, mildue, cats, dogs" (01/07/2017)  . Anal fissure   . Asthma    sees [name]   . Benign prostatic hypertrophy    (sees Dr. Denman George  . CAD (coronary artery disease)    a. s/p CABG 2007. b. Cath 08/2016 - 4/4 patent grafts.  . Carotid bruit    carotid u/s 10/10: 0.39% bilaterally  . Chronic combined systolic and diastolic CHF (congestive heart failure) ([place])   .  Complication of anesthesia 1980s   "w/anal cyst OR, he gave me a saddle block then put a narcotic in spinal cord; had a severe reaction to that" (01/07/2017)  . Congestive heart failure (CHF) ([place])   . ED (erectile dysfunction)   . Family history of adverse reaction to anesthesia    "daughter wakes up during OR" (01/07/2017)  . GERD (gastroesophageal reflux disease)   . Gout   . HTN (hypertension)   . Hx of colonic polyps    (sees Dr. Henrene Pastor)  . Hyperlipidemia   . Hypothyroidism   . Moderate to severe aortic  stenosis    a. s/p TAVR 02/2017.  [name] Myocardial infarction ([place]) ~ 2000  . Obesity   . Osteoarthritis    "was in my knees, hands" (01/07/2017 )  . PAF (paroxysmal atrial fibrillation) ([place])    a. documented post TAVR.  [name] Precancerous skin lesion    (sees Dr. Allyson Sabal)  . Prostate cancer ([place]) dx'd ~ 2014  . 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 Heart attack Father 54  . Allergic rhinitis Father   . Asthma Father   . Heart failure Mother 66  . Uterine cancer Mother   . Breast cancer Mother   . Colon cancer Neg Hx   . Esophageal cancer Neg Hx     Past Surgical History:  Procedure Laterality Date  . ANUS SURGERY     "opened it back up cause it wouldn't heal; wound up w/a fissure" (01/07/2017)  . BIV ICD INSERTION CRT-D N/A 05/02/2017   Procedure: BIV ICD INSERTION CRT-D;  Surgeon: Deboraha Sprang, MD;  Location: [facility];  Service: Cardiovascular;  Laterality: N/A;  . CARDIAC CATHETERIZATION  10/29/2005  . CARDIAC CATHETERIZATION N/A 08/27/2016   Procedure: Right/Left Heart Cath and Coronary/Graft Angiography;  Surgeon: Burnell Blanks, MD;  Location: [facility];  Service: Cardiovascular;  Laterality: N/A;  . CATARACT EXTRACTION W/ INTRAOCULAR LENS  IMPLANT, BILATERAL Bilateral   . CATARACT EXTRACTION, BILATERAL  2012  . COLONOSCOPY  06/30/2008   no repeats needed   . COLONOSCOPY     had 3 or 4 in the past   . CORONARY ARTERY BYPASS GRAFT  2007   "CABG X4"  . CYST EXCISION PERINEAL  1980s  . HAMMER TOE SURGERY Bilateral   . JOINT REPLACEMENT    . KNEE ARTHROPLASTY  07/30/2011   Procedure: COMPUTER ASSISTED TOTAL KNEE ARTHROPLASTY;  Surgeon: Meredith Pel;  Location: [facility];  Service: Orthopedics;  Laterality: Left;  left total knee arthroplasty  . MASTECTOMY  SUBCUTANEOUS Bilateral   . MULTIPLE TOOTH EXTRACTIONS    . ORIF FINGER / THUMB FRACTURE Right ~ 1980   "repair of thumb injury"  . PROSTATE BIOPSY    . REPLACEMENT TOTAL KNEE BILATERAL Bilateral 2012  . TEE WITHOUT CARDIOVERSION N/A 03/04/2017   Procedure: TRANSESOPHAGEAL ECHOCARDIOGRAM (TEE);  Surgeon: Burnell Blanks, MD;  Location: [facility];  Service: Open Heart Surgery;  Laterality: N/A;  . TOTAL KNEE REVISION Right 05/18/2019   Procedure: RIGHT PATELLA REVISION/REMOVAL;  Surgeon: Meredith Pel, MD;  Location: [facility];  Service: Orthopedics;  Laterality: Right;  . TRANSCATHETER AORTIC VALVE REPLACEMENT, TRANSFEMORAL N/A 03/04/2017   Procedure: TRANSCATHETER AORTIC VALVE REPLACEMENT, TRANSFEMORAL;  Surgeon: [name]  D, MD;  Location: [facility];  Service: Open Heart Surgery;  Laterality: N/A;   Social History   Occupational History  . Occupation: Clinical biochemist    Comment: builds malls  Tobacco Use  . Smoking status: Former Smoker    Packs/day: 3.50    Years: 13.00    Pack years: 45.50    Types: Cigarettes    Quit date: 1963    Years since quitting: 57.9  . Smokeless tobacco: Never Used  Substance and Sexual Activity  . Alcohol use: Yes    Alcohol/week: 0.0 standard drinks    Comment: 01/07/2017 "might have 2 beers 2-3 times/month"  . Drug use: No  . Sexual activity: Never

## 2019-07-12 ENCOUNTER — Ambulatory Visit (INDEPENDENT_AMBULATORY_CARE_PROVIDER_SITE_OTHER): Payer: Medicare Other

## 2019-07-12 DIAGNOSIS — J309 Allergic rhinitis, unspecified: Secondary | ICD-10-CM

## 2019-07-12 DIAGNOSIS — M25561 Pain in right knee: Secondary | ICD-10-CM | POA: Diagnosis not present

## 2019-07-14 DIAGNOSIS — M25561 Pain in right knee: Secondary | ICD-10-CM | POA: Diagnosis not present

## 2019-07-16 DIAGNOSIS — M25561 Pain in right knee: Secondary | ICD-10-CM | POA: Diagnosis not present

## 2019-07-19 DIAGNOSIS — H353221 Exudative age-related macular degeneration, left eye, with active choroidal neovascularization: Secondary | ICD-10-CM | POA: Diagnosis not present

## 2019-07-19 DIAGNOSIS — H35363 Drusen (degenerative) of macula, bilateral: Secondary | ICD-10-CM | POA: Diagnosis not present

## 2019-07-19 DIAGNOSIS — H35453 Secondary pigmentary degeneration, bilateral: Secondary | ICD-10-CM | POA: Diagnosis not present

## 2019-07-19 DIAGNOSIS — H353213 Exudative age-related macular degeneration, right eye, with inactive scar: Secondary | ICD-10-CM | POA: Diagnosis not present

## 2019-07-19 DIAGNOSIS — M25561 Pain in right knee: Secondary | ICD-10-CM | POA: Diagnosis not present

## 2019-07-19 DIAGNOSIS — H3122 Choroidal dystrophy (central areolar) (generalized) (peripapillary): Secondary | ICD-10-CM | POA: Diagnosis not present

## 2019-07-20 ENCOUNTER — Ambulatory Visit (INDEPENDENT_AMBULATORY_CARE_PROVIDER_SITE_OTHER): Payer: Medicare Other

## 2019-07-20 DIAGNOSIS — J309 Allergic rhinitis, unspecified: Secondary | ICD-10-CM

## 2019-07-21 DIAGNOSIS — M25561 Pain in right knee: Secondary | ICD-10-CM | POA: Diagnosis not present

## 2019-07-26 ENCOUNTER — Ambulatory Visit (INDEPENDENT_AMBULATORY_CARE_PROVIDER_SITE_OTHER): Payer: Medicare Other | Admitting: *Deleted

## 2019-07-26 DIAGNOSIS — J309 Allergic rhinitis, unspecified: Secondary | ICD-10-CM | POA: Diagnosis not present

## 2019-07-26 DIAGNOSIS — M25561 Pain in right knee: Secondary | ICD-10-CM | POA: Diagnosis not present

## 2019-07-27 DIAGNOSIS — H353221 Exudative age-related macular degeneration, left eye, with active choroidal neovascularization: Secondary | ICD-10-CM | POA: Diagnosis not present

## 2019-07-28 DIAGNOSIS — M25561 Pain in right knee: Secondary | ICD-10-CM | POA: Diagnosis not present

## 2019-07-29 ENCOUNTER — Other Ambulatory Visit: Payer: Self-pay | Admitting: Primary Care

## 2019-07-29 NOTE — Progress Notes (Signed)
Remote ICD transmission.   

## 2019-07-30 DIAGNOSIS — M25561 Pain in right knee: Secondary | ICD-10-CM | POA: Diagnosis not present

## 2019-08-02 ENCOUNTER — Other Ambulatory Visit: Payer: Self-pay | Admitting: Family Medicine

## 2019-08-03 ENCOUNTER — Ambulatory Visit (INDEPENDENT_AMBULATORY_CARE_PROVIDER_SITE_OTHER): Payer: Medicare Other

## 2019-08-03 DIAGNOSIS — J309 Allergic rhinitis, unspecified: Secondary | ICD-10-CM | POA: Diagnosis not present

## 2019-08-03 DIAGNOSIS — M25561 Pain in right knee: Secondary | ICD-10-CM | POA: Diagnosis not present

## 2019-08-05 DIAGNOSIS — L819 Disorder of pigmentation, unspecified: Secondary | ICD-10-CM | POA: Diagnosis not present

## 2019-08-05 DIAGNOSIS — L570 Actinic keratosis: Secondary | ICD-10-CM | POA: Diagnosis not present

## 2019-08-05 DIAGNOSIS — L820 Inflamed seborrheic keratosis: Secondary | ICD-10-CM | POA: Diagnosis not present

## 2019-08-05 DIAGNOSIS — L853 Xerosis cutis: Secondary | ICD-10-CM | POA: Diagnosis not present

## 2019-08-05 DIAGNOSIS — M25561 Pain in right knee: Secondary | ICD-10-CM | POA: Diagnosis not present

## 2019-08-05 DIAGNOSIS — D485 Neoplasm of uncertain behavior of skin: Secondary | ICD-10-CM | POA: Diagnosis not present

## 2019-08-09 ENCOUNTER — Ambulatory Visit (INDEPENDENT_AMBULATORY_CARE_PROVIDER_SITE_OTHER): Payer: Medicare Other

## 2019-08-09 DIAGNOSIS — M25561 Pain in right knee: Secondary | ICD-10-CM | POA: Diagnosis not present

## 2019-08-09 DIAGNOSIS — J309 Allergic rhinitis, unspecified: Secondary | ICD-10-CM | POA: Diagnosis not present

## 2019-08-11 ENCOUNTER — Other Ambulatory Visit: Payer: Self-pay | Admitting: Family Medicine

## 2019-08-11 ENCOUNTER — Telehealth: Payer: Self-pay

## 2019-08-11 NOTE — Telephone Encounter (Signed)
Left message for patient to remind of missed remote transmission. 12/15 

## 2019-08-12 DIAGNOSIS — M25561 Pain in right knee: Secondary | ICD-10-CM | POA: Diagnosis not present

## 2019-08-16 ENCOUNTER — Encounter: Payer: Self-pay | Admitting: Orthopedic Surgery

## 2019-08-16 ENCOUNTER — Other Ambulatory Visit: Payer: Self-pay

## 2019-08-16 ENCOUNTER — Ambulatory Visit (INDEPENDENT_AMBULATORY_CARE_PROVIDER_SITE_OTHER): Payer: Medicare Other

## 2019-08-16 ENCOUNTER — Ambulatory Visit: Payer: Self-pay

## 2019-08-16 ENCOUNTER — Ambulatory Visit (INDEPENDENT_AMBULATORY_CARE_PROVIDER_SITE_OTHER): Payer: Medicare Other | Admitting: Orthopedic Surgery

## 2019-08-16 DIAGNOSIS — M25559 Pain in unspecified hip: Secondary | ICD-10-CM

## 2019-08-16 DIAGNOSIS — J309 Allergic rhinitis, unspecified: Secondary | ICD-10-CM | POA: Diagnosis not present

## 2019-08-16 DIAGNOSIS — M25561 Pain in right knee: Secondary | ICD-10-CM | POA: Diagnosis not present

## 2019-08-18 DIAGNOSIS — M25561 Pain in right knee: Secondary | ICD-10-CM | POA: Diagnosis not present

## 2019-08-18 DIAGNOSIS — M7071 Other bursitis of hip, right hip: Secondary | ICD-10-CM | POA: Diagnosis not present

## 2019-08-18 DIAGNOSIS — M7072 Other bursitis of hip, left hip: Secondary | ICD-10-CM | POA: Diagnosis not present

## 2019-08-18 NOTE — Progress Notes (Signed)
No ICM remote transmission received for 08/09/2019 and next ICM transmission scheduled for 09/20/2019.   

## 2019-08-20 ENCOUNTER — Encounter: Payer: Self-pay | Admitting: Orthopedic Surgery

## 2019-08-20 NOTE — Progress Notes (Signed)
Office Visit Note   Patient: Justin [name]           Date of Birth: 26-Nov-1933           MRN: [id number] Visit Date: 08/16/2019 Requested by: Laurey Morale, MD [place],  [place] [id number] PCP: Laurey Morale, MD  Subjective: Chief Complaint  Patient presents with  . Hip Pain    HPI: Justin [name] is a 85 y.o. male who presents to the office complaining of right hip pain.  Patient notes 3 episodes of right hip pain in the last couple months.  He describes burning pain over the trochanteric bursa that lasts for a couple hours before relief.  He denies any injury that he can recall.  He notes that it is tender to touch during these episodes.  He denies any radicular pain.  Denies any numbness or tingling or groin pain.  Denies any significant change in low back pain.  He also describes new onset he also to the left side that started over the weekend.  He has had 1 episode on the left side that lasted for between 6 to 8 hours.  He has been going to physical therapy for his knee 3 times a week.  He has tried dry needling with some relief at PT.  He denies ever having injections into his trochanteric bursa..                ROS:  All systems reviewed are negative as they relate to the chief complaint within the history of present illness.  Patient denies fevers or chills.  Assessment & Plan: Visit Diagnoses:  1. Greater trochanteric pain syndrome     Plan: Patient is an 85 year old male who presents complaining of right hip right hip pain.  He is tender to palpation over the trochanteric bursa.  He has only had a few episodes but when they happen there are severe and debilitating.  X-rays of the lumbar spine are negative for any acute pathology to explain patient's symptoms.  Impression is trochanteric bursitis.  Discussed options available to patient including doing nothing versus therapy versus anti-inflammatory medication versus cortisone injection.  Patient will  continue with his physical therapy for his knee and add IT band stretching as well.  Plan for return office visit in January or February 2021 for trochanteric bursa cortisone injection.  Patient agreed with plan and will follow up with the office in 2021.  Follow-Up Instructions: No follow-ups on file.   Orders:  Orders Placed This Encounter  Procedures  . XR Lumbar Spine 2-3 Views   No orders of the defined types were placed in this encounter.     Procedures: No procedures performed   Clinical Data: No additional findings.  Objective: Vital Signs: There were no vitals taken for this visit.  Physical Exam:  Constitutional: Patient appears well-developed HEENT:  Head: Normocephalic Eyes:EOM are normal Neck: Normal range of motion Cardiovascular: Normal rate Pulmonary/chest: Effort normal Neurologic: Patient is alert Skin: Skin is warm Psychiatric: Patient has normal mood and affect  Ortho Exam:  Negative straight leg raise bilaterally No tenderness palpation throughout the axial lumbar spine or paraspinal musculature Bilateral tenderness to palpation over the trochanteric versus, right greater than left 5/5 motor strength of the bilateral hip flexors, quadriceps, hamstring, dorsiflexion, plantarflexion Sensation intact through all dermatomes of the bilateral lower extremities  Specialty Comments:  No specialty comments available.  Imaging: No results found.   [place]  History: Patient Active Problem List   Diagnosis Date Noted  . Loosening of knee joint prosthesis ([place]) 05/18/2019  . Surgical counseling visit 04/30/2019  . Cough 04/30/2019  . Gastroesophageal reflux disease 04/30/2019  . Bilateral impacted cerumen 04/30/2019  . S/P ICD (internal cardiac defibrillator) procedure 05/03/2017  . NICM (nonischemic cardiomyopathy) ([place]) 05/02/2017  . 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 Severe aortic valve stenosis 03/04/2017  . Hypokalemia  due to loss of potassium with diuresis 01/10/2017  . Aortic stenosis 01/10/2017  . Systolic heart failure ([place]) 01/10/2017  . Acute on chronic diastolic CHF (congestive heart failure) ([place]) 01/07/2017  . Hypothyroidism 12/23/2016  . Prostate cancer ([place])   . 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 Edema of both legs 09/01/2014  . Asthma, intermittent 09/01/2014  . Aortic valve disorders 01/01/2011  . Coronary artery disease involving native coronary artery of native heart without angina pectoris   . ACUTE BRONCHITIS 09/06/2010  . GOUT 09/12/2009  . CELLULITIS, FOOT 08/15/2009  . CAD, AUTOLOGOUS BYPASS GRAFT 05/10/2009  . MURMUR 05/08/2009  . BRUIT 05/08/2009  . MUSCLE CRAMPS 12/12/2008  . KNEE SPRAIN 09/21/2008  . BENIGN PROSTATIC HYPERTROPHY 07/07/2008  . Osteoarthritis 07/07/2008  . CONTUSION, HIP 05/13/2008  . BENIGN PROSTATIC HYPERTROPHY, WITH OBSTRUCTION 02/01/2008  . LOW BACK PAIN SYNDROME 02/01/2008  . TESTOSTERONE DEFICIENCY 07/03/2007  . HYPERLIPIDEMIA 07/03/2007  . Essential hypertension 07/03/2007  . MYOCARDIAL INFARCTION, HX OF 07/03/2007  . Allergic rhinitis 07/03/2007  . ACTINIC KERATOSIS 07/03/2007  . COLONIC POLYPS, HX OF 07/03/2007  . ACNE ROSACEA, HX OF 07/03/2007  . 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   Past Medical History:  Diagnosis Date  . Age-related macular degeneration, dry, both eyes   . Allergic    "24/7; 365 days/year; I'm allergic to pollens, dust, all southern grasses/trees, mold, mildue, cats, dogs" (01/07/2017)  . Anal fissure   . Asthma    sees [name]   . Benign prostatic hypertrophy    (sees Dr. Denman George  . CAD (coronary artery disease)    a. s/p CABG 2007. b. Cath 08/2016 - 4/4 patent grafts.  . Carotid bruit    carotid u/s 10/10: 0.39% bilaterally  . Chronic combined systolic and diastolic CHF (congestive heart failure) ([place])   . Complication of anesthesia 1980s   "w/anal cyst OR, he gave me a saddle block then put a narcotic in spinal  cord; had a severe reaction to that" (01/07/2017)  . Congestive heart failure (CHF) ([place])   . ED (erectile dysfunction)   . Family history of adverse reaction to anesthesia    "daughter wakes up during OR" (01/07/2017)  . GERD (gastroesophageal reflux disease)   . Gout   . HTN (hypertension)   . Hx of colonic polyps    (sees Dr. Henrene Pastor)  . Hyperlipidemia   . Hypothyroidism   . Moderate to severe aortic stenosis    a. s/p TAVR 02/2017.  [name] Myocardial infarction ([place]) ~ 2000  . Obesity   . Osteoarthritis    "was in my knees, hands" (01/07/2017 )  . PAF (paroxysmal atrial fibrillation) ([place])    a. documented post TAVR.  [name] Precancerous skin lesion    (sees Dr. Allyson Sabal)  . Prostate cancer ([place]) dx'd ~ 2014  . 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 Heart attack Father 64  . Allergic rhinitis Father   . Asthma Father   . Heart failure Mother 25  . Uterine cancer Mother   . Breast cancer Mother   . Colon cancer Neg Hx   . Esophageal cancer Neg Hx     Past Surgical History:  Procedure Laterality Date  . ANUS SURGERY     "opened it back up cause it wouldn't heal; wound up w/a fissure" (01/07/2017)  . BIV ICD INSERTION CRT-D N/A 05/02/2017   Procedure: BIV ICD INSERTION CRT-D;  Surgeon: Deboraha Sprang, MD;  Location: [facility];  Service: Cardiovascular;  Laterality: N/A;  . CARDIAC CATHETERIZATION  10/29/2005  . CARDIAC CATHETERIZATION N/A 08/27/2016   Procedure: Right/Left Heart Cath and Coronary/Graft Angiography;  Surgeon: Burnell Blanks, MD;  Location: [facility];  Service: Cardiovascular;  Laterality: N/A;  . CATARACT EXTRACTION W/ INTRAOCULAR LENS  IMPLANT, BILATERAL Bilateral   . CATARACT EXTRACTION, BILATERAL  2012  . COLONOSCOPY  06/30/2008   no repeats needed    . COLONOSCOPY     had 3 or 4 in the past   . CORONARY ARTERY BYPASS GRAFT  2007   "CABG X4"  . CYST EXCISION PERINEAL  1980s  . HAMMER TOE SURGERY Bilateral   . JOINT REPLACEMENT    . KNEE ARTHROPLASTY  07/30/2011   Procedure: COMPUTER ASSISTED TOTAL KNEE ARTHROPLASTY;  Surgeon: Meredith Pel;  Location: [facility];  Service: Orthopedics;  Laterality: Left;  left total knee arthroplasty  . MASTECTOMY SUBCUTANEOUS Bilateral   . MULTIPLE TOOTH EXTRACTIONS    . ORIF FINGER / THUMB FRACTURE Right ~ 1980   "repair of thumb injury"  . PROSTATE BIOPSY    . REPLACEMENT TOTAL KNEE BILATERAL Bilateral 2012  . TEE WITHOUT CARDIOVERSION N/A 03/04/2017   Procedure: TRANSESOPHAGEAL ECHOCARDIOGRAM (TEE);  Surgeon: Burnell Blanks, MD;  Location: [facility];  Service: Open Heart Surgery;  Laterality: N/A;  . TOTAL KNEE REVISION Right 05/18/2019   Procedure: RIGHT PATELLA REVISION/REMOVAL;  Surgeon: Meredith Pel, MD;  Location: [facility];  Service: Orthopedics;  Laterality: Right;  . TRANSCATHETER AORTIC VALVE REPLACEMENT, TRANSFEMORAL N/A 03/04/2017   Procedure: TRANSCATHETER AORTIC VALVE REPLACEMENT, TRANSFEMORAL;  Surgeon: Burnell Blanks, MD;  Location: [facility];  Service: Open Heart Surgery;  Laterality: N/A;   Social History   Occupational History  . Occupation: Clinical biochemist    Comment: builds malls  Tobacco Use  . Smoking status: Former Smoker    Packs/day: 3.50    Years: 13.00    Pack years: 45.50    Types: Cigarettes    Quit date: 1963    Years since quitting: 58.0  . Smokeless tobacco: Never Used  Substance and Sexual Activity  . Alcohol use: Yes    Alcohol/week: 0.0 standard drinks    Comment: 01/07/2017 "might have 2 beers 2-3 times/month"  . Drug use: No  . Sexual activity: Never

## 2019-08-23 ENCOUNTER — Ambulatory Visit (INDEPENDENT_AMBULATORY_CARE_PROVIDER_SITE_OTHER): Payer: Medicare Other

## 2019-08-23 DIAGNOSIS — M7071 Other bursitis of hip, right hip: Secondary | ICD-10-CM | POA: Diagnosis not present

## 2019-08-23 DIAGNOSIS — J309 Allergic rhinitis, unspecified: Secondary | ICD-10-CM | POA: Diagnosis not present

## 2019-08-23 DIAGNOSIS — H353213 Exudative age-related macular degeneration, right eye, with inactive scar: Secondary | ICD-10-CM | POA: Diagnosis not present

## 2019-08-23 DIAGNOSIS — M25561 Pain in right knee: Secondary | ICD-10-CM | POA: Diagnosis not present

## 2019-08-23 DIAGNOSIS — M7072 Other bursitis of hip, left hip: Secondary | ICD-10-CM | POA: Diagnosis not present

## 2019-08-23 DIAGNOSIS — H353122 Nonexudative age-related macular degeneration, left eye, intermediate dry stage: Secondary | ICD-10-CM | POA: Diagnosis not present

## 2019-08-25 DIAGNOSIS — M25561 Pain in right knee: Secondary | ICD-10-CM | POA: Diagnosis not present

## 2019-08-25 DIAGNOSIS — M7071 Other bursitis of hip, right hip: Secondary | ICD-10-CM | POA: Diagnosis not present

## 2019-08-25 DIAGNOSIS — M7072 Other bursitis of hip, left hip: Secondary | ICD-10-CM | POA: Diagnosis not present

## 2019-08-30 ENCOUNTER — Ambulatory Visit (INDEPENDENT_AMBULATORY_CARE_PROVIDER_SITE_OTHER): Payer: Medicare Other

## 2019-08-30 DIAGNOSIS — J309 Allergic rhinitis, unspecified: Secondary | ICD-10-CM | POA: Diagnosis not present

## 2019-08-30 DIAGNOSIS — M7072 Other bursitis of hip, left hip: Secondary | ICD-10-CM | POA: Diagnosis not present

## 2019-08-30 DIAGNOSIS — M25561 Pain in right knee: Secondary | ICD-10-CM | POA: Diagnosis not present

## 2019-08-30 DIAGNOSIS — M7071 Other bursitis of hip, right hip: Secondary | ICD-10-CM | POA: Diagnosis not present

## 2019-09-01 DIAGNOSIS — M7072 Other bursitis of hip, left hip: Secondary | ICD-10-CM | POA: Diagnosis not present

## 2019-09-01 DIAGNOSIS — M25561 Pain in right knee: Secondary | ICD-10-CM | POA: Diagnosis not present

## 2019-09-01 DIAGNOSIS — H353221 Exudative age-related macular degeneration, left eye, with active choroidal neovascularization: Secondary | ICD-10-CM | POA: Diagnosis not present

## 2019-09-01 DIAGNOSIS — M7071 Other bursitis of hip, right hip: Secondary | ICD-10-CM | POA: Diagnosis not present

## 2019-09-03 DIAGNOSIS — M25561 Pain in right knee: Secondary | ICD-10-CM | POA: Diagnosis not present

## 2019-09-03 DIAGNOSIS — M7072 Other bursitis of hip, left hip: Secondary | ICD-10-CM | POA: Diagnosis not present

## 2019-09-03 DIAGNOSIS — M7071 Other bursitis of hip, right hip: Secondary | ICD-10-CM | POA: Diagnosis not present

## 2019-09-04 ENCOUNTER — Other Ambulatory Visit: Payer: Self-pay | Admitting: Primary Care

## 2019-09-06 ENCOUNTER — Ambulatory Visit (INDEPENDENT_AMBULATORY_CARE_PROVIDER_SITE_OTHER): Payer: Medicare Other

## 2019-09-06 DIAGNOSIS — M7072 Other bursitis of hip, left hip: Secondary | ICD-10-CM | POA: Diagnosis not present

## 2019-09-06 DIAGNOSIS — J309 Allergic rhinitis, unspecified: Secondary | ICD-10-CM | POA: Diagnosis not present

## 2019-09-06 DIAGNOSIS — M7071 Other bursitis of hip, right hip: Secondary | ICD-10-CM | POA: Diagnosis not present

## 2019-09-06 DIAGNOSIS — M25561 Pain in right knee: Secondary | ICD-10-CM | POA: Diagnosis not present

## 2019-09-06 NOTE — Telephone Encounter (Signed)
[name], please advise if you are okay with us refilling med. 

## 2019-09-08 DIAGNOSIS — M7071 Other bursitis of hip, right hip: Secondary | ICD-10-CM | POA: Diagnosis not present

## 2019-09-08 DIAGNOSIS — M25561 Pain in right knee: Secondary | ICD-10-CM | POA: Diagnosis not present

## 2019-09-08 DIAGNOSIS — M7072 Other bursitis of hip, left hip: Secondary | ICD-10-CM | POA: Diagnosis not present

## 2019-09-10 DIAGNOSIS — M7072 Other bursitis of hip, left hip: Secondary | ICD-10-CM | POA: Diagnosis not present

## 2019-09-10 DIAGNOSIS — M25561 Pain in right knee: Secondary | ICD-10-CM | POA: Diagnosis not present

## 2019-09-10 DIAGNOSIS — M7071 Other bursitis of hip, right hip: Secondary | ICD-10-CM | POA: Diagnosis not present

## 2019-09-13 ENCOUNTER — Ambulatory Visit (INDEPENDENT_AMBULATORY_CARE_PROVIDER_SITE_OTHER): Payer: Medicare Other

## 2019-09-13 DIAGNOSIS — J309 Allergic rhinitis, unspecified: Secondary | ICD-10-CM

## 2019-09-13 DIAGNOSIS — M7072 Other bursitis of hip, left hip: Secondary | ICD-10-CM | POA: Diagnosis not present

## 2019-09-13 DIAGNOSIS — M7071 Other bursitis of hip, right hip: Secondary | ICD-10-CM | POA: Diagnosis not present

## 2019-09-13 DIAGNOSIS — M25561 Pain in right knee: Secondary | ICD-10-CM | POA: Diagnosis not present

## 2019-09-13 NOTE — Progress Notes (Signed)
EXP 09/14/20

## 2019-09-15 DIAGNOSIS — M7071 Other bursitis of hip, right hip: Secondary | ICD-10-CM | POA: Diagnosis not present

## 2019-09-15 DIAGNOSIS — M7072 Other bursitis of hip, left hip: Secondary | ICD-10-CM | POA: Diagnosis not present

## 2019-09-15 DIAGNOSIS — J301 Allergic rhinitis due to pollen: Secondary | ICD-10-CM

## 2019-09-15 DIAGNOSIS — M25561 Pain in right knee: Secondary | ICD-10-CM | POA: Diagnosis not present

## 2019-09-16 DIAGNOSIS — J3089 Other allergic rhinitis: Secondary | ICD-10-CM

## 2019-09-17 DIAGNOSIS — M7071 Other bursitis of hip, right hip: Secondary | ICD-10-CM | POA: Diagnosis not present

## 2019-09-17 DIAGNOSIS — M7072 Other bursitis of hip, left hip: Secondary | ICD-10-CM | POA: Diagnosis not present

## 2019-09-17 DIAGNOSIS — M25561 Pain in right knee: Secondary | ICD-10-CM | POA: Diagnosis not present

## 2019-09-19 ENCOUNTER — Other Ambulatory Visit: Payer: Self-pay | Admitting: Internal Medicine

## 2019-09-19 ENCOUNTER — Other Ambulatory Visit: Payer: Self-pay | Admitting: Primary Care

## 2019-09-20 ENCOUNTER — Ambulatory Visit (INDEPENDENT_AMBULATORY_CARE_PROVIDER_SITE_OTHER): Payer: Medicare Other

## 2019-09-20 DIAGNOSIS — J309 Allergic rhinitis, unspecified: Secondary | ICD-10-CM | POA: Diagnosis not present

## 2019-09-20 DIAGNOSIS — M25561 Pain in right knee: Secondary | ICD-10-CM | POA: Diagnosis not present

## 2019-09-20 DIAGNOSIS — Z95810 Presence of automatic (implantable) cardiac defibrillator: Secondary | ICD-10-CM | POA: Diagnosis not present

## 2019-09-20 DIAGNOSIS — M7072 Other bursitis of hip, left hip: Secondary | ICD-10-CM | POA: Diagnosis not present

## 2019-09-20 DIAGNOSIS — M7071 Other bursitis of hip, right hip: Secondary | ICD-10-CM | POA: Diagnosis not present

## 2019-09-20 DIAGNOSIS — I5020 Unspecified systolic (congestive) heart failure: Secondary | ICD-10-CM | POA: Diagnosis not present

## 2019-09-20 NOTE — Telephone Encounter (Signed)
Pradaxa 150mg  refill request received. Pt is 86 years old, weight-118.9kg, Crea-0.82 on 05/12/2019, last seen by Dr. Angelena Form on 07/02/2019, Diagnosis-Afib, CrCl-114.2ml/min; Dose is appropriate based on dosing criteria. Will send in refill to requested pharmacy.

## 2019-09-22 DIAGNOSIS — M25561 Pain in right knee: Secondary | ICD-10-CM | POA: Diagnosis not present

## 2019-09-22 DIAGNOSIS — M7072 Other bursitis of hip, left hip: Secondary | ICD-10-CM | POA: Diagnosis not present

## 2019-09-22 DIAGNOSIS — M7071 Other bursitis of hip, right hip: Secondary | ICD-10-CM | POA: Diagnosis not present

## 2019-09-24 DIAGNOSIS — M25561 Pain in right knee: Secondary | ICD-10-CM | POA: Diagnosis not present

## 2019-09-24 DIAGNOSIS — M7071 Other bursitis of hip, right hip: Secondary | ICD-10-CM | POA: Diagnosis not present

## 2019-09-24 DIAGNOSIS — M7072 Other bursitis of hip, left hip: Secondary | ICD-10-CM | POA: Diagnosis not present

## 2019-09-24 NOTE — Progress Notes (Signed)
EPIC Encounter for ICM Monitoring  Patient Name: Justin [name] is a 86 y.o. male Date: 09/24/2019 Primary Care Physican: Laurey Morale, MD Primary Cardiologist:[place] Electrophysiologist:[facility] Bi-V Pacing:79.3% 09/24/2019 Weight: 250-252 lbs   Spoke with patient and he is doing well.  He has been cooking a lot at home especially soups without salt.    Optivol thoracic impedancenormal.  Prescribed: Furosemide80 mgtaketake 1 tablet once a day alternating with one twice a day.  Recommendations: No changes and encouraged to call if experiencing any fluid symptoms.  Follow-up plan: ICM clinic phone appointment on 10/25/2018.   91 day device clinic remote transmission 10/06/2019.  Office visit with Oda Kilts, PA on 11/09/2019  Copy of ICM check sent to Dr. [name].   3 month ICM trend: 09/20/2019    1 Year ICM trend:       [name], RN 09/24/2019 2:09 PM

## 2019-09-27 ENCOUNTER — Ambulatory Visit (INDEPENDENT_AMBULATORY_CARE_PROVIDER_SITE_OTHER): Payer: Medicare Other

## 2019-09-27 DIAGNOSIS — M7072 Other bursitis of hip, left hip: Secondary | ICD-10-CM | POA: Diagnosis not present

## 2019-09-27 DIAGNOSIS — J309 Allergic rhinitis, unspecified: Secondary | ICD-10-CM

## 2019-09-27 DIAGNOSIS — M7071 Other bursitis of hip, right hip: Secondary | ICD-10-CM | POA: Diagnosis not present

## 2019-09-27 DIAGNOSIS — M25561 Pain in right knee: Secondary | ICD-10-CM | POA: Diagnosis not present

## 2019-09-29 DIAGNOSIS — M7072 Other bursitis of hip, left hip: Secondary | ICD-10-CM | POA: Diagnosis not present

## 2019-09-29 DIAGNOSIS — M7071 Other bursitis of hip, right hip: Secondary | ICD-10-CM | POA: Diagnosis not present

## 2019-09-29 DIAGNOSIS — M25561 Pain in right knee: Secondary | ICD-10-CM | POA: Diagnosis not present

## 2019-09-30 ENCOUNTER — Telehealth (HOSPITAL_COMMUNITY): Payer: Self-pay | Admitting: Family Medicine

## 2019-10-04 ENCOUNTER — Other Ambulatory Visit: Payer: Self-pay | Admitting: Pulmonary Disease

## 2019-10-04 ENCOUNTER — Ambulatory Visit (INDEPENDENT_AMBULATORY_CARE_PROVIDER_SITE_OTHER): Payer: Medicare Other

## 2019-10-04 DIAGNOSIS — J309 Allergic rhinitis, unspecified: Secondary | ICD-10-CM | POA: Diagnosis not present

## 2019-10-04 DIAGNOSIS — M25561 Pain in right knee: Secondary | ICD-10-CM | POA: Diagnosis not present

## 2019-10-04 DIAGNOSIS — M7071 Other bursitis of hip, right hip: Secondary | ICD-10-CM | POA: Diagnosis not present

## 2019-10-04 DIAGNOSIS — M7072 Other bursitis of hip, left hip: Secondary | ICD-10-CM | POA: Diagnosis not present

## 2019-10-04 NOTE — Telephone Encounter (Signed)
Refill request for Dymista  Last OV: 04/30/2019 Next OV: 10/18/2019  Last ordered by : Lake Bells Quantity:23g with 11 refills Instructions: Use 2 sprays each nostril twice daily  Aaron Edelman please advise  Allergies  Allergen Reactions  . Peanut-Containing Drug Products Anaphylaxis  . Sulfonamide Derivatives Anaphylaxis  . Amlodipine Swelling    Swelling in ankles  . Eliquis [Apixaban] Other (See Comments)    Back/hip pain  . Lisinopril Cough    cough  . Xarelto [Rivaroxaban] Other (See Comments)    Back/hip pain    Current Outpatient Medications on File Prior to Visit  Medication Sig Dispense Refill  . aspirin 81 MG tablet Take 1 tablet (81 mg total) by mouth daily. 30 tablet 0  . atorvastatin (LIPITOR) 20 MG tablet TAKE 1 TABLET ONCE DAILY. 90 tablet 0  . Azelastine-Fluticasone (DYMISTA) 137-50 MCG/ACT SUSP USE 2 SPRAYS EACH NOSTRIL TWICE DAILY. (Patient taking differently: Place 1 spray into both nostrils 2 (two) times daily. USE 2 SPRAYS EACH NOSTRIL TWICE DAILY.) 23 g 11  . benzonatate (TESSALON) 200 MG capsule TAKE 1 CAPSULE EVERY 8 HOURS AS NEEDED FOR COUGH. 45 capsule 0  . cetirizine (ZYRTEC) 10 MG tablet Take 10 mg by mouth daily.    . chlorpheniramine (CHLOR-TRIMETON) 4 MG tablet Take 2 tablets (8 mg total) by mouth at bedtime. 60 tablet 12  . colchicine 0.6 MG tablet 1 po tid x 1 day 1 po bid x 2 days Then daily for 5 days 30 tablet 0  . EPINEPHrine 0.3 mg/0.3 mL IJ SOAJ injection Inject 0.3 mLs (0.3 mg total) into the muscle as needed for anaphylaxis. 2 Device 1  . famotidine (PEPCID) 10 MG tablet Take 10 mg by mouth at bedtime.    . furosemide (LASIX) 80 MG tablet Take 1 tablet (80 mg total) by mouth 2 (two) times daily. TAKE ONE TABLET ONCE A DAY ALTERNATING WITH ONE TABLET TWICE A DAY. (Patient taking differently: Take 80 mg by mouth See admin instructions. Take 80 mg by mouth once a day every other day, alternating with twice daily to coincide with potassium) 180 tablet 3  .  ipratropium (ATROVENT) 0.03 % nasal spray Place 2 sprays into both nostrils every 6 (six) hours as needed for rhinitis. 30 mL 4  . isosorbide mononitrate (IMDUR) 30 MG 24 hr tablet TAKE 1 TABLET ONCE DAILY. 90 tablet 0  . Ketotifen Fumarate (ZADITOR OP) Apply 1 drop to eye daily as needed (itchy eyes).     [name] levothyroxine (SYNTHROID) 100 MCG tablet Take 1 tablet (100 mcg total) by mouth daily. 90 tablet 3  . losartan (COZAAR) 25 MG tablet TAKE 1 TABLET EACH DAY. (Patient taking differently: Take 25 mg by mouth daily. ) 90 tablet 3  . methocarbamol (ROBAXIN) 500 MG tablet Take 1 tablet (500 mg total) by mouth every 8 (eight) hours as needed for muscle spasms. 30 tablet 0  . metoprolol succinate (TOPROL XL) 50 MG 24 hr tablet Take 1 tablet (50 mg total) by mouth daily. Take with or immediately following a meal. 90 tablet 3  . montelukast (SINGULAIR) 10 MG tablet TAKE 1 TABLET ONCE DAILY IN THE EVENING. 90 tablet 0  . Multiple Vitamins-Minerals (PRESERVISION AREDS PO) Take 2 tablets by mouth every morning.    [name] omeprazole (PRILOSEC) 20 MG capsule TAKE (1) CAPSULE TWICE DAILY. 180 capsule 0  . potassium chloride SA (K-DUR) 20 MEQ tablet Take 2 tablets by mouth twice daily alternating with 2 tablets daily. (Patient taking  differently: Take 40 mEq by mouth See admin instructions. Take 40 meq by mouth once a day, alternating with twice daily to coincide with furosemide) 270 tablet 3  . PRADAXA 150 MG CAPS capsule TAKE (1) CAPSULE TWICE DAILY. 180 capsule 1  . triamcinolone cream (KENALOG) 0.1 % Apply 1 application topically daily as needed for dry skin.    . valACYclovir (VALTREX) 500 MG tablet Take 500 mg by mouth 2 (two) times daily as needed (cold sores).     No current facility-administered medications on file prior to visit.

## 2019-10-05 ENCOUNTER — Telehealth: Payer: Self-pay

## 2019-10-05 NOTE — Telephone Encounter (Addendum)
**Note De-identified Deniss Wormley Obfuscation** -----  **Note De-Identified Kc Summerson Obfuscation** Message from Rodman Key, RN sent at 10/04/2019  4:20 PM EST ----- Regarding: can you help? Wyonia Hough,  I got this expired Pradaxa copay card from this gentleman, along with a note stating it expired in 2019 and was given to him in fall 2020. He had gotten a 3 month supply around that time (fall 2020) and then just tried to use the copay card on 09/24/19.  The pharmacy refused it due to the expiration date.  The note he attached asks if assistance.  The copay on 09/24/19 was 351.99 for 3 months.  Can you see if he qualifies for any other assistance? Thank you! Michalene

## 2019-10-05 NOTE — Telephone Encounter (Signed)
**Note De-Identified Makaylie Dedeaux Obfuscation** I called [employer] and s/w [place], Per [place] the pt did pay $350/90 day supply for his Pradaxa and that it shows that only $50 of that went towards his deductible.  I have called and left a message asking the pt to call me back as I will offer him the phone number to reach out to [place] at [phone]  pt asst program as the offer different types of assistance.

## 2019-10-06 DIAGNOSIS — M7072 Other bursitis of hip, left hip: Secondary | ICD-10-CM | POA: Diagnosis not present

## 2019-10-06 DIAGNOSIS — M25561 Pain in right knee: Secondary | ICD-10-CM | POA: Diagnosis not present

## 2019-10-06 DIAGNOSIS — M7071 Other bursitis of hip, right hip: Secondary | ICD-10-CM | POA: Diagnosis not present

## 2019-10-08 DIAGNOSIS — M7071 Other bursitis of hip, right hip: Secondary | ICD-10-CM | POA: Diagnosis not present

## 2019-10-08 DIAGNOSIS — M7072 Other bursitis of hip, left hip: Secondary | ICD-10-CM | POA: Diagnosis not present

## 2019-10-08 DIAGNOSIS — M25561 Pain in right knee: Secondary | ICD-10-CM | POA: Diagnosis not present

## 2019-10-11 ENCOUNTER — Ambulatory Visit: Payer: Medicare Other | Admitting: Physician Assistant

## 2019-10-11 ENCOUNTER — Ambulatory Visit (INDEPENDENT_AMBULATORY_CARE_PROVIDER_SITE_OTHER): Payer: Medicare Other

## 2019-10-11 ENCOUNTER — Ambulatory Visit (INDEPENDENT_AMBULATORY_CARE_PROVIDER_SITE_OTHER): Payer: Medicare Other | Admitting: *Deleted

## 2019-10-11 DIAGNOSIS — J309 Allergic rhinitis, unspecified: Secondary | ICD-10-CM | POA: Diagnosis not present

## 2019-10-11 DIAGNOSIS — I428 Other cardiomyopathies: Secondary | ICD-10-CM

## 2019-10-11 LAB — CUP PACEART REMOTE DEVICE CHECK
Battery Remaining Longevity: 36 mo
Battery Voltage: 2.96 V
Brady Statistic AP VP Percent: 12.76 %
Brady Statistic AP VS Percent: 0.48 %
Brady Statistic AS VP Percent: 80.32 %
Brady Statistic AS VS Percent: 6.44 %
Brady Statistic RA Percent Paced: 12.35 %
Brady Statistic RV Percent Paced: 81.94 %
Date Time Interrogation Session: 20210215072404
HighPow Impedance: 80 Ohm
Implantable Lead Implant Date: 20180907
Implantable Lead Implant Date: 20180907
Implantable Lead Implant Date: 20180907
Implantable Lead Location: 753858
Implantable Lead Location: 753859
Implantable Lead Location: 753860
Implantable Lead Model: 4396
Implantable Lead Model: 5076
Implantable Pulse Generator Implant Date: 20180907
Lead Channel Impedance Value: 342 Ohm
Lead Channel Impedance Value: 342 Ohm
Lead Channel Impedance Value: 437 Ohm
Lead Channel Impedance Value: 494 Ohm
Lead Channel Impedance Value: 589 Ohm
Lead Channel Impedance Value: 893 Ohm
Lead Channel Pacing Threshold Amplitude: 0.875 V
Lead Channel Pacing Threshold Amplitude: 0.875 V
Lead Channel Pacing Threshold Amplitude: 1 V
Lead Channel Pacing Threshold Pulse Width: 0.4 ms
Lead Channel Pacing Threshold Pulse Width: 0.4 ms
Lead Channel Pacing Threshold Pulse Width: 1 ms
Lead Channel Sensing Intrinsic Amplitude: 10.875 mV
Lead Channel Sensing Intrinsic Amplitude: 10.875 mV
Lead Channel Sensing Intrinsic Amplitude: 2.25 mV
Lead Channel Sensing Intrinsic Amplitude: 2.25 mV
Lead Channel Setting Pacing Amplitude: 1.75 V
Lead Channel Setting Pacing Amplitude: 2 V
Lead Channel Setting Pacing Amplitude: 2.5 V
Lead Channel Setting Pacing Pulse Width: 0.4 ms
Lead Channel Setting Pacing Pulse Width: 1 ms
Lead Channel Setting Sensing Sensitivity: 0.3 mV

## 2019-10-12 NOTE — Progress Notes (Signed)
ICD Remote  

## 2019-10-13 DIAGNOSIS — H353221 Exudative age-related macular degeneration, left eye, with active choroidal neovascularization: Secondary | ICD-10-CM | POA: Diagnosis not present

## 2019-10-15 ENCOUNTER — Other Ambulatory Visit (HOSPITAL_COMMUNITY): Payer: Self-pay | Admitting: Family Medicine

## 2019-10-18 ENCOUNTER — Ambulatory Visit: Payer: Medicare Other | Admitting: Critical Care Medicine

## 2019-10-18 ENCOUNTER — Encounter: Payer: Self-pay | Admitting: Critical Care Medicine

## 2019-10-18 ENCOUNTER — Other Ambulatory Visit: Payer: Self-pay

## 2019-10-18 ENCOUNTER — Ambulatory Visit (INDEPENDENT_AMBULATORY_CARE_PROVIDER_SITE_OTHER): Payer: Medicare Other

## 2019-10-18 VITALS — BP 110/60 | HR 75 | Temp 97.8°F | Ht 66.0 in | Wt 262.2 lb

## 2019-10-18 DIAGNOSIS — J3089 Other allergic rhinitis: Secondary | ICD-10-CM | POA: Diagnosis not present

## 2019-10-18 DIAGNOSIS — G453 Amaurosis fugax: Secondary | ICD-10-CM | POA: Diagnosis not present

## 2019-10-18 DIAGNOSIS — H53132 Sudden visual loss, left eye: Secondary | ICD-10-CM | POA: Diagnosis not present

## 2019-10-18 DIAGNOSIS — J309 Allergic rhinitis, unspecified: Secondary | ICD-10-CM | POA: Diagnosis not present

## 2019-10-18 DIAGNOSIS — H353221 Exudative age-related macular degeneration, left eye, with active choroidal neovascularization: Secondary | ICD-10-CM | POA: Diagnosis not present

## 2019-10-18 DIAGNOSIS — H353213 Exudative age-related macular degeneration, right eye, with inactive scar: Secondary | ICD-10-CM | POA: Diagnosis not present

## 2019-10-18 DIAGNOSIS — H35363 Drusen (degenerative) of macula, bilateral: Secondary | ICD-10-CM | POA: Diagnosis not present

## 2019-10-18 MED ORDER — MONTELUKAST SODIUM 10 MG PO TABS: ORAL_TABLET | ORAL | 3 refills | Status: DC

## 2019-10-18 MED ORDER — FLUTICASONE PROPIONATE 50 MCG/ACT NA SUSP: 2.0000 | Freq: Every day | NASAL | 11 refills | Status: DC

## 2019-10-18 MED ORDER — AZELASTINE HCL 0.15 % NA SOLN: 2.0000 | Freq: Two times a day (BID) | NASAL | 11 refills | Status: DC

## 2019-10-18 NOTE — Progress Notes (Signed)
Synopsis: Referred in 2017 for asthma by Justin Morale, MD.  Previously patient of Dr. Lake [name].  Subjective:   PATIENT ID: Justin [name] GENDER: male DOB: [birth year], MRN: [id number]  Chief Complaint  Patient presents with  . Follow-up    Patient is here to estabish care. Patient has shortness of breath with exertion and daily activites like showering. Patient states that he has a lot of drainage, sneezing and a dry cough    Justin [name] is an 86 year old gentleman with a history of irritable larynx syndrome and allergic rhinosinusitis who presents for follow-up.  He was previously thought to have asthma, but had no improvement or worsening of symptoms with starting or stopping ICS-LABA inhalers.  He has had an episode of esophageal candidiasis in the past, prompting him to stop his Symbicort.  He has not used any inhalers in several years.  He has been on combination fluticasone and ipratropium nasal spray, Zyrtec, Benadryl nightly for several years.  He gets allergy shots every Monday at [name] office.  He has a chronic cough due to postnasal drip which is stable.  He complains of mildly increased dyspnea on exertion recently, mostly in the shower or when he is leaning over.  He has no lung congestion, coughing, sputum production.  He has significant ongoing clear nasal drainage.  He continues to treat his GERD with Prilosec and Zantac.    Flowsheet data:  Lab Results  Component Value Date   NITRICOXIDE 43 08/18/2018    Past Medical History:  Diagnosis Date  . Age-related macular degeneration, dry, both eyes   . Allergic    "24/7; 365 days/year; I'm allergic to pollens, dust, all southern grasses/trees, mold, mildue, cats, dogs" (01/07/2017)  . Anal fissure   . Asthma    sees Dr. Lake [name]   . Benign prostatic hypertrophy    (sees Dr. Denman [name]  . CAD (coronary artery disease)    a. s/p CABG 2007. b. Cath 08/2016 - 4/4 patent grafts.  . Carotid bruit    carotid u/s  10/10: 0.39% bilaterally  . Chronic combined systolic and diastolic CHF (congestive heart failure) ([place])   . Complication of anesthesia 1980s   "w/anal cyst OR, he gave me a saddle block then put a narcotic in spinal cord; had a severe reaction to that" (01/07/2017)  . Congestive heart failure (CHF) ([place])   . ED (erectile dysfunction)   . Family history of adverse reaction to anesthesia    "daughter wakes up during OR" (01/07/2017)  . GERD (gastroesophageal reflux disease)   . Gout   . HTN (hypertension)   . Hx of colonic polyps    (sees Dr. Henrene [name])  . Hyperlipidemia   . Hypothyroidism   . Moderate to severe aortic stenosis    a. s/p TAVR 02/2017.  [name] Myocardial infarction ([place]) ~ 2000  . Obesity   . Osteoarthritis    "was in my knees, hands" (01/07/2017 )  . PAF (paroxysmal atrial fibrillation) ([place])    a. documented post TAVR.  [name] Precancerous skin lesion    (sees Dr. Allyson [name])  . Prostate cancer ([place]) dx'd ~ 2014  . 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 Heart attack Father 9  . Allergic rhinitis Father   . Asthma Father   .  Heart failure Mother 37  . Uterine cancer Mother   . Breast cancer Mother   . Colon cancer Neg Hx   . Esophageal cancer Neg Hx      Past Surgical History:  Procedure Laterality Date  . ANUS SURGERY     "opened it back up cause it wouldn't heal; wound up w/a fissure" (01/07/2017)  . BIV ICD INSERTION CRT-D N/A 05/02/2017   Procedure: BIV ICD INSERTION CRT-D;  Surgeon: Deboraha Sprang, MD;  Location: [facility];  Service: Cardiovascular;  Laterality: N/A;  . CARDIAC CATHETERIZATION  10/29/2005  . CARDIAC CATHETERIZATION N/A 08/27/2016   Procedure: Right/Left Heart Cath and Coronary/Graft Angiography;  Surgeon: Burnell Blanks, MD;  Location: [facility];  Service: Cardiovascular;  Laterality: N/A;  . CATARACT EXTRACTION W/ INTRAOCULAR LENS  IMPLANT, BILATERAL Bilateral   . CATARACT EXTRACTION, BILATERAL  2012  . COLONOSCOPY  06/30/2008   no repeats needed   . COLONOSCOPY     had 3 or 4 in the past   . CORONARY ARTERY BYPASS GRAFT  2007   "CABG X4"  . CYST EXCISION PERINEAL  1980s  . HAMMER TOE SURGERY Bilateral   . JOINT REPLACEMENT    . KNEE ARTHROPLASTY  07/30/2011   Procedure: COMPUTER ASSISTED TOTAL KNEE ARTHROPLASTY;  Surgeon: Meredith Pel;  Location: [facility];  Service: Orthopedics;  Laterality: Left;  left total knee arthroplasty  . MASTECTOMY SUBCUTANEOUS Bilateral   . MULTIPLE TOOTH EXTRACTIONS    . ORIF FINGER / THUMB FRACTURE Right ~ 1980   "repair of thumb injury"  . PROSTATE BIOPSY    . REPLACEMENT TOTAL KNEE BILATERAL Bilateral 2012  . TEE WITHOUT CARDIOVERSION N/A 03/04/2017   Procedure: TRANSESOPHAGEAL ECHOCARDIOGRAM (TEE);  Surgeon: Burnell Blanks, MD;  Location: [facility];  Service: Open Heart Surgery;  Laterality: N/A;  . TOTAL KNEE REVISION Right 05/18/2019   Procedure: RIGHT PATELLA REVISION/REMOVAL;  Surgeon: Meredith Pel, MD;  Location: [facility];  Service: Orthopedics;  Laterality: Right;  . TRANSCATHETER AORTIC VALVE REPLACEMENT, TRANSFEMORAL N/A 03/04/2017   Procedure: TRANSCATHETER AORTIC VALVE REPLACEMENT, TRANSFEMORAL;  Surgeon: Burnell Blanks, MD;  Location: [facility];  Service: Open Heart Surgery;  Laterality: N/A;    Social History   Socioeconomic History  . Marital status: Married    Spouse name: Not on file  . Number of children: 3  . Years of education: Not on file  . Highest education level: Not on file  Occupational History  . Occupation: Clinical biochemist    Comment: builds malls  Tobacco Use  . Smoking status: Former Smoker    Packs/day: 3.50    Years: 13.00    Pack years: 45.50    Types: Cigarettes    Quit date: 1963    Years since quitting: 58.1  .  Smokeless tobacco: Never Used  Substance and Sexual Activity  . Alcohol use: Yes    Alcohol/week: 0.0 standard drinks    Comment: 01/07/2017 "might have 2 beers 2-3 times/month"  . Drug use: No  . Sexual activity: Never  Other Topics Concern  . Not on file  Social History Narrative   FH of CAD, Male 1st degree relative less than age 44.   Social Determinants of Health   Financial Resource Strain:   . Difficulty of Paying Living Expenses: Not on file  Food Insecurity:   . Worried About Charity fundraiser in the Last Year: Not on file  . Ran  Out of Food in the Last Year: Not on file  Transportation Needs:   . Lack of Transportation (Medical): Not on file  . Lack of Transportation (Non-Medical): Not on file  Physical Activity:   . Days of Exercise per Week: Not on file  . Minutes of Exercise per Session: Not on file  Stress:   . Feeling of Stress : Not on file  Social Connections:   . Frequency of Communication with Friends and Family: Not on file  . Frequency of Social Gatherings with Friends and Family: Not on file  . Attends Religious Services: Not on file  . Active Member of Clubs or Organizations: Not on file  . Attends Archivist Meetings: Not on file  . Marital Status: Not on file  Intimate Partner Violence:   . Fear of Current or Ex-Partner: Not on file  . Emotionally Abused: Not on file  . Physically Abused: Not on file  . Sexually Abused: Not on file     Allergies  Allergen Reactions  . Peanut-Containing Drug Products Anaphylaxis  . Sulfonamide Derivatives Anaphylaxis  . Amlodipine Swelling    Swelling in ankles  . Eliquis [Apixaban] Other (See Comments)    Back/hip pain  . Lisinopril Cough    cough  . Xarelto [Rivaroxaban] Other (See Comments)    Back/hip pain     Immunization History  Administered Date(s) Administered  . Fluad Quad(high Dose 65+) 05/06/2019  . Hep A / Hep B 04/06/2012, 10/07/2012  . Hepatitis B 05/07/2012  . Influenza  Split 06/28/2013  . Influenza Whole 06/05/2007, 05/13/2008, 06/06/2009, 05/30/2010  . Influenza, High Dose Seasonal PF 05/15/2018  . Influenza,inj,Quad PF,6+ Mos 05/20/2016  . Influenza-Unspecified 05/25/2014, 07/08/2015, 05/06/2017  . PFIZER SARS-COV-2 Vaccination 09/28/2019, 10/12/2019  . Pneumococcal Conjugate-13 09/18/2015  . Pneumococcal Polysaccharide-23 05/13/2008, 05/25/2014  . Td 05/30/2010  . Zoster 10/21/2007  . Zoster Recombinat (Shingrix) 05/18/2018, 10/16/2018    Outpatient Medications Prior to Visit  Medication Sig Dispense Refill  . aspirin 81 MG tablet Take 1 tablet (81 mg total) by mouth daily. 30 tablet 0  . atorvastatin (LIPITOR) 20 MG tablet TAKE 1 TABLET ONCE DAILY. 90 tablet 0  . benzonatate (TESSALON) 200 MG capsule TAKE 1 CAPSULE EVERY 8 HOURS AS NEEDED FOR COUGH. 45 capsule 0  . cetirizine (ZYRTEC) 10 MG tablet Take 10 mg by mouth daily.    . chlorpheniramine (CHLOR-TRIMETON) 4 MG tablet Take 2 tablets (8 mg total) by mouth at bedtime. 60 tablet 12  . colchicine 0.6 MG tablet 1 po tid x 1 day 1 po bid x 2 days Then daily for 5 days 30 tablet 0  . DYMISTA 137-50 MCG/ACT SUSP USE 2 SPRAYS EACH NOSTRIL TWICE DAILY. 23 g 0  . EPINEPHrine 0.3 mg/0.3 mL IJ SOAJ injection Inject 0.3 mLs (0.3 mg total) into the muscle as needed for anaphylaxis. 2 Device 1  . famotidine (PEPCID) 10 MG tablet Take 10 mg by mouth at bedtime.    . furosemide (LASIX) 80 MG tablet Take 1 tablet (80 mg total) by mouth 2 (two) times daily. TAKE ONE TABLET ONCE A DAY ALTERNATING WITH ONE TABLET TWICE A DAY. (Patient taking differently: Take 80 mg by mouth See admin instructions. Take 80 mg by mouth once a day every other day, alternating with twice daily to coincide with potassium) 180 tablet 3  . isosorbide mononitrate (IMDUR) 30 MG 24 hr tablet TAKE 1 TABLET ONCE DAILY. 90 tablet 0  . Ketotifen Fumarate (ZADITOR  OP) Apply 1 drop to eye daily as needed (itchy eyes).     [name] levothyroxine  (SYNTHROID) 100 MCG tablet Take 1 tablet (100 mcg total) by mouth daily. 90 tablet 3  . losartan (COZAAR) 25 MG tablet TAKE 1 TABLET EACH DAY. (Patient taking differently: Take 25 mg by mouth daily. ) 90 tablet 3  . methocarbamol (ROBAXIN) 500 MG tablet Take 1 tablet (500 mg total) by mouth every 8 (eight) hours as needed for muscle spasms. 30 tablet 0  . metoprolol succinate (TOPROL XL) 50 MG 24 hr tablet Take 1 tablet (50 mg total) by mouth daily. Take with or immediately following a meal. 90 tablet 3  . Multiple Vitamins-Minerals (PRESERVISION AREDS PO) Take 2 tablets by mouth every morning.    [name] omeprazole (PRILOSEC) 20 MG capsule TAKE (1) CAPSULE TWICE DAILY. 180 capsule 0  . potassium chloride SA (K-DUR) 20 MEQ tablet Take 2 tablets by mouth twice daily alternating with 2 tablets daily. (Patient taking differently: Take 40 mEq by mouth See admin instructions. Take 40 meq by mouth once a day, alternating with twice daily to coincide with furosemide) 270 tablet 3  . PRADAXA 150 MG CAPS capsule TAKE (1) CAPSULE TWICE DAILY. 180 capsule 1  . triamcinolone cream (KENALOG) 0.1 % Apply 1 application topically daily as needed for dry skin.    . valACYclovir (VALTREX) 500 MG tablet Take 500 mg by mouth 2 (two) times daily as needed (cold sores).    . montelukast (SINGULAIR) 10 MG tablet TAKE 1 TABLET ONCE DAILY IN THE EVENING. 90 tablet 0  . ipratropium (ATROVENT) 0.03 % nasal spray Place 2 sprays into both nostrils every 6 (six) hours as needed for rhinitis. (Patient not taking: Reported on 10/18/2019) 30 mL 4   No facility-administered medications prior to visit.    Review of Systems  Constitutional: Negative for chills, fever and malaise/fatigue.  HENT: Negative for congestion.   Respiratory: Positive for cough and shortness of breath.   Cardiovascular: Negative for chest pain and leg swelling.     Objective:   Vitals:   10/18/19 1506  BP: 110/60  Pulse: 75  Temp: 97.8 F (36.6 C)    TempSrc: Temporal  SpO2: 98%  Weight: 262 lb 3.2 oz (118.9 kg)  Height: 5\' 6"  (1.676 m)   98% on   RA BMI Readings from Last 3 Encounters:  10/18/19 42.32 kg/m  07/02/19 42.32 kg/m  05/18/19 42.42 kg/m   Wt Readings from Last 3 Encounters:  10/18/19 262 lb 3.2 oz (118.9 kg)  07/02/19 262 lb 3.2 oz (118.9 kg)  05/18/19 262 lb 12.8 oz (119.2 kg)    Physical Exam Vitals reviewed.  Constitutional:      Appearance: He is obese.  HENT:     Head: Normocephalic and atraumatic.     Nose:     Comments: Deferred due to masking requirement.    Mouth/Throat:     Comments: Deferred due to masking requirement. Eyes:     General: No scleral icterus. Cardiovascular:     Rate and Rhythm: Normal rate and regular rhythm.  Pulmonary:     Comments: Breathing comfortably on room air, no conversational dyspnea.  Clear to auscultation bilaterally. Abdominal:     General: There is no distension.     Palpations: Abdomen is soft.  Musculoskeletal:        General: No deformity.     Cervical back: Neck supple.     Comments: Minimal symmetric lower extremity swelling.  Lymphadenopathy:     Cervical: No cervical adenopathy.  Skin:    General: Skin is warm and dry.     Findings: No rash.  Neurological:     General: No focal deficit present.     Mental Status: He is alert.     Motor: No weakness.     Coordination: Coordination normal.  Psychiatric:        Mood and Affect: Mood normal.        Behavior: Behavior normal.      CBC    Component Value Date/Time   WBC 11.4 (H) 05/12/2019 1030   RBC 4.82 05/12/2019 1030   HGB 13.3 05/12/2019 1030   HGB 13.9 04/23/2017 1012   HCT 41.3 05/12/2019 1030   HCT 42.1 04/23/2017 1012   PLT 205 05/12/2019 1030   PLT 241 04/23/2017 1012   MCV 85.7 05/12/2019 1030   MCV 84 04/23/2017 1012   MCH 27.6 05/12/2019 1030   MCHC 32.2 05/12/2019 1030   RDW 15.6 (H) 05/12/2019 1030   RDW 15.8 (H) 04/23/2017 1012   LYMPHSABS 2.5 03/11/2019 0858    LYMPHSABS 2.0 04/23/2017 1012   MONOABS 1.1 (H) 03/11/2019 0858   EOSABS 0.4 03/11/2019 0858   EOSABS 0.3 04/23/2017 1012   BASOSABS 0.1 03/11/2019 0858   BASOSABS 0.1 04/23/2017 1012    CHEMISTRY No results for input(s): NA, K, CL, CO2, GLUCOSE, BUN, CREATININE, CALCIUM, MG, PHOS in the last 168 hours. CrCl cannot be calculated (Patient's most recent lab result is older than the maximum 21 days allowed.).   Chest Imaging- films reviewed: CXR, 2 view 04/30/2019-ICD in place, ectatic aorta versus hiatal hernia.  Pulmonary Functions Testing Results: PFT Results Latest Ref Rng & Units 01/28/2017  FVC-Pre L 2.68  FVC-Predicted Pre % 76  FVC-Post L 2.59  FVC-Predicted Post % 73  Pre FEV1/FVC % % 79  Post FEV1/FCV % % 82  FEV1-Pre L 2.12  FEV1-Predicted Pre % 85  FEV1-Post L 2.13  DLCO UNC% % 57  DLCO COR %Predicted % 81  TLC L 4.67  TLC % Predicted % 72  RV % Predicted % 74   2018- No significant obstruction or bronchodilator reversibility. Mild restriction, moderate diffusion impairment.  Spirometry 09/24/2018: FVC 2.26 (67%) FEV1 1.76 (76%) Ratio 78   Echocardiogram 04/29/2019: LVEF 35 to 40%, moderate LVH, diastolic dysfunction.  Severe hypokinesis of LV anterior wall, anteroseptal wall, and apical segment.  Mildly dilated LA, reduced RV systolic function.  Mildly dilated RA.  Mild mitral valve thickening, aortic valve replacement present-TAVR.  Mild dilation of ascending aorta.  Heart Catheterization 08/27/2016:   There is moderate aortic valve stenosis.  SVG graft was visualized by angiography and is normal in caliber.  1st Mrg lesion, 65 %stenosed.  Ost LM to LM lesion, 100 %stenosed.  SVG graft was visualized by angiography and is normal in caliber.  LIMA graft was visualized by angiography and is normal in caliber.  Prox RCA to Mid RCA lesion, 100 %stenosed.  SVG graft was visualized by angiography and is normal in caliber and anatomically normal.  Hemodynamic  findings consistent with mild pulmonary hypertension.   1. 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  2. The Left main is occluded at the ostium.  3. The LAD fills from the patent IMA graft and the Diagonal fills from the patent vein graft 4. The Circumflex fills from the patent vein graft  to the OM. There is a moderate stenosis in the proximal segment of the OM branch proximal to the insertion of the vein graft.  5. The distal RCA fills from the patent vein graft.  6. Moderate AS 7. Elevated filling pressures    Assessment & Plan:     ICD-10-CM   1. Non-seasonal allergic rhinitis due to other allergic trigger  J30.89     Allergic rhinitis, possibly also vasomotor rhinitis -Continue Flonase and ipratropium nasal sprays.  Will separate from combination of medication due to cost. -Continue Zyrtec daily. -Continue allergy shots and follow-up with Dr. Fredderick Phenix as prescribed -Adding Singulair once daily -Does not require inhalers.   Lung restriction, likely due to body habitus.  Dyspnea on exertion likely due to underlying cardiac disease, body habitus. -Recommend modest weight loss to maintain healthy weight.   RTC in 6 months.   Current Outpatient Medications:  .  aspirin 81 MG tablet, Take 1 tablet (81 mg total) by mouth daily., Disp: 30 tablet, Rfl: 0 .  atorvastatin (LIPITOR) 20 MG tablet, TAKE 1 TABLET ONCE DAILY., Disp: 90 tablet, Rfl: 0 .  benzonatate (TESSALON) 200 MG capsule, TAKE 1 CAPSULE EVERY 8 HOURS AS NEEDED FOR COUGH., Disp: 45 capsule, Rfl: 0 .  cetirizine (ZYRTEC) 10 MG tablet, Take 10 mg by mouth daily., Disp: , Rfl:  .  chlorpheniramine (CHLOR-TRIMETON) 4 MG tablet, Take 2 tablets (8 mg total) by mouth at bedtime., Disp: 60 tablet, Rfl: 12 .  colchicine 0.6 MG tablet, 1 po tid x 1 day 1 po bid x 2 days Then daily for 5 days, Disp: 30 tablet, Rfl: 0 .  DYMISTA 137-50 MCG/ACT SUSP, USE 2 SPRAYS  EACH NOSTRIL TWICE DAILY., Disp: 23 g, Rfl: 0 .  EPINEPHrine 0.3 mg/0.3 mL IJ SOAJ injection, Inject 0.3 mLs (0.3 mg total) into the muscle as needed for anaphylaxis., Disp: 2 Device, Rfl: 1 .  famotidine (PEPCID) 10 MG tablet, Take 10 mg by mouth at bedtime., Disp: , Rfl:  .  furosemide (LASIX) 80 MG tablet, Take 1 tablet (80 mg total) by mouth 2 (two) times daily. TAKE ONE TABLET ONCE A DAY ALTERNATING WITH ONE TABLET TWICE A DAY. (Patient taking differently: Take 80 mg by mouth See admin instructions. Take 80 mg by mouth once a day every other day, alternating with twice daily to coincide with potassium), Disp: 180 tablet, Rfl: 3 .  isosorbide mononitrate (IMDUR) 30 MG 24 hr tablet, TAKE 1 TABLET ONCE DAILY., Disp: 90 tablet, Rfl: 0 .  Ketotifen Fumarate (ZADITOR OP), Apply 1 drop to eye daily as needed (itchy eyes). , Disp: , Rfl:  .  levothyroxine (SYNTHROID) 100 MCG tablet, Take 1 tablet (100 mcg total) by mouth daily., Disp: 90 tablet, Rfl: 3 .  losartan (COZAAR) 25 MG tablet, TAKE 1 TABLET EACH DAY. (Patient taking differently: Take 25 mg by mouth daily. ), Disp: 90 tablet, Rfl: 3 .  methocarbamol (ROBAXIN) 500 MG tablet, Take 1 tablet (500 mg total) by mouth every 8 (eight) hours as needed for muscle spasms., Disp: 30 tablet, Rfl: 0 .  metoprolol succinate (TOPROL XL) 50 MG 24 hr tablet, Take 1 tablet (50 mg total) by mouth daily. Take with or immediately following a meal., Disp: 90 tablet, Rfl: 3 .  montelukast (SINGULAIR) 10 MG tablet, TAKE 1 TABLET ONCE DAILY IN THE EVENING., Disp: 90 tablet, Rfl: 3 .  Multiple Vitamins-Minerals (PRESERVISION AREDS PO), Take 2 tablets by mouth every morning., Disp: , Rfl:  .  omeprazole (PRILOSEC) 20 MG capsule, TAKE (1) CAPSULE TWICE DAILY., Disp: 180 capsule, Rfl: 0 .  potassium chloride SA (K-DUR) 20 MEQ tablet, Take 2 tablets by mouth twice daily alternating with 2 tablets daily. (Patient taking differently: Take 40 mEq by mouth See admin instructions.  Take 40 meq by mouth once a day, alternating with twice daily to coincide with furosemide), Disp: 270 tablet, Rfl: 3 .  PRADAXA 150 MG CAPS capsule, TAKE (1) CAPSULE TWICE DAILY., Disp: 180 capsule, Rfl: 1 .  triamcinolone cream (KENALOG) 0.1 %, Apply 1 application topically daily as needed for dry skin., Disp: , Rfl:  .  valACYclovir (VALTREX) 500 MG tablet, Take 500 mg by mouth 2 (two) times daily as needed (cold sores)., Disp: , Rfl:  .  Azelastine HCl 0.15 % SOLN, Place 2 sprays into the nose in the morning and at bedtime., Disp: 30 mL, Rfl: 11 .  fluticasone (FLONASE) 50 MCG/ACT nasal spray, Place 2 sprays into both nostrils daily., Disp: 16 g, Rfl: 11 .  ipratropium (ATROVENT) 0.03 % nasal spray, Place 2 sprays into both nostrils every 6 (six) hours as needed for rhinitis. (Patient not taking: Reported on 10/18/2019), Disp: 30 mL, Rfl: 4    Julian Hy, DO [facility] 10/18/2019 6:35 PM

## 2019-10-18 NOTE — Patient Instructions (Addendum)
Thank you for visiting Dr. Carlis Abbott at [facility]. We recommend the following:  Keep taking cetirizine/ Zyrtec daily. No need for inhalers.  Meds ordered this encounter  Medications  . Azelastine HCl 0.15 % SOLN    Sig: Place 2 sprays into the nose in the morning and at bedtime.    Dispense:  30 mL    Refill:  11  . fluticasone (FLONASE) 50 MCG/ACT nasal spray    Sig: Place 2 sprays into both nostrils daily.    Dispense:  16 g    Refill:  11  . montelukast (SINGULAIR) 10 MG tablet    Sig: TAKE 1 TABLET ONCE DAILY IN THE EVENING.    Dispense:  90 tablet    Refill:  3    Return in about 6 months (around 04/16/2020).    Please do your part to reduce the spread of COVID-19.

## 2019-10-21 NOTE — Progress Notes (Signed)
Chief Complaint  Patient presents with  . Follow-up    CAD   History of Present Illness: 86 yo male with history of HTN, HLD, severe aortic stenosis s/p TAVR, PAF, chronic diastolic CHF and CAD s/p 4V CABG, syncope, junctional bradycardia, PVCS, non-sustained VT and LBBB s/p ICD implantationwho is here today for cardiac follow up. He is known to have CAD s/p 4V CABG in March 2007 (LIMA to LAD, SVG to Diagonal, SVG to OM, SVG to RCA). Last cardiac cath in January 2018 with 4/4 patent bypass grafts. He had progression of his aortic stenosis with worsened dyspnea and acute on chronic CHF in the spring of 2018. Echo May 2018 with mildly reduced LV systolic function, [id number]. His aortic stenosis was severe. He underwent TAVR on 03/04/17 with a 29 mm Edwards Sapien 3 valve from the right transfemoral approach. He developed atrial fibrillation following his procedure and was placed on IV amiodarone with conversion to sinus rhythm. He has been on ASA and Eliquis (Xarelto had been used initially but pt thought it caused him to have shoulder pain). Follow up visit in our office 03/19/17 and pt noted to have irregular rhythm with intraventricular conduction delay and was felt by our EP team to represent 2:1 conduction of competing fast and slow pathways. Cardiac monitor with sinus rhythm with periods of junctional rhythm, frequent PVCs, frequent PACs and runs of a wide complex tachycardia. ICD was implanted on 05/03/17. Echo September 2020 with LVEF=35-40%.   He is here today for follow up. The patient denies any chest pain, dyspnea, palpitations, lower extremity edema, orthopnea, PND, dizziness, near syncope or syncope. He has had some weakness. He has had visual changes and his eye doctor recommended a stroke workup.  Primary Care Physician: Laurey Morale, MD  Past Medical History:  Diagnosis Date  . Age-related macular degeneration, dry, both eyes   . Allergic    "24/7; 365 days/year; I'm allergic to  pollens, dust, all southern grasses/trees, mold, mildue, cats, dogs" (01/07/2017)  . Anal fissure   . Asthma    sees [name]   . Benign prostatic hypertrophy    (sees Dr. Denman George  . CAD (coronary artery disease)    a. s/p CABG 2007. b. Cath 08/2016 - 4/4 patent grafts.  . Carotid bruit    carotid u/s 10/10: 0.39% bilaterally  . Chronic combined systolic and diastolic CHF (congestive heart failure) ([place])   . Complication of anesthesia 1980s   "w/anal cyst OR, he gave me a saddle block then put a narcotic in spinal cord; had a severe reaction to that" (01/07/2017)  . Congestive heart failure (CHF) ([place])   . ED (erectile dysfunction)   . Family history of adverse reaction to anesthesia    "daughter wakes up during OR" (01/07/2017)  . GERD (gastroesophageal reflux disease)   . Gout   . HTN (hypertension)   . Hx of colonic polyps    (sees Dr. Henrene Pastor)  . Hyperlipidemia   . Hypothyroidism   . Moderate to severe aortic stenosis    a. s/p TAVR 02/2017.  [name] Myocardial infarction ([place]) ~ 2000  . Obesity   . Osteoarthritis    "was in my knees, hands" (01/07/2017 )  . PAF (paroxysmal atrial fibrillation) ([place])    a. documented post TAVR.  [name] Precancerous skin lesion    (sees Dr. Allyson Sabal)  . Prostate cancer ([place]) dx'd ~ 2014  . 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 S/P TAVR (transcatheter aortic  valve replacement) 03/04/2017   29 mm Edwards Sapien 3 transcatheter heart valve placed via percutaneous right transfemoral approach    Past Surgical History:  Procedure Laterality Date  . ANUS SURGERY     "opened it back up cause it wouldn't heal; wound up w/a fissure" (01/07/2017)  . BIV ICD INSERTION CRT-D N/A 05/02/2017   Procedure: BIV ICD INSERTION CRT-D;  Surgeon: Deboraha Sprang, MD;  Location: [facility];  Service: Cardiovascular;  Laterality: N/A;  . CARDIAC CATHETERIZATION  10/29/2005  . CARDIAC CATHETERIZATION N/A 08/27/2016   Procedure: Right/Left Heart Cath and Coronary/Graft Angiography;   Surgeon: Burnell Blanks, MD;  Location: [facility];  Service: Cardiovascular;  Laterality: N/A;  . CATARACT EXTRACTION W/ INTRAOCULAR LENS  IMPLANT, BILATERAL Bilateral   . CATARACT EXTRACTION, BILATERAL  2012  . COLONOSCOPY  06/30/2008   no repeats needed   . COLONOSCOPY     had 3 or 4 in the past   . CORONARY ARTERY BYPASS GRAFT  2007   "CABG X4"  . CYST EXCISION PERINEAL  1980s  . HAMMER TOE SURGERY Bilateral   . JOINT REPLACEMENT    . KNEE ARTHROPLASTY  07/30/2011   Procedure: COMPUTER ASSISTED TOTAL KNEE ARTHROPLASTY;  Surgeon: Meredith Pel;  Location: [facility];  Service: Orthopedics;  Laterality: Left;  left total knee arthroplasty  . MASTECTOMY SUBCUTANEOUS Bilateral   . MULTIPLE TOOTH EXTRACTIONS    . ORIF FINGER / THUMB FRACTURE Right ~ 1980   "repair of thumb injury"  . PROSTATE BIOPSY    . REPLACEMENT TOTAL KNEE BILATERAL Bilateral 2012  . TEE WITHOUT CARDIOVERSION N/A 03/04/2017   Procedure: TRANSESOPHAGEAL ECHOCARDIOGRAM (TEE);  Surgeon: Burnell Blanks, MD;  Location: [facility];  Service: Open Heart Surgery;  Laterality: N/A;  . TOTAL KNEE REVISION Right 05/18/2019   Procedure: RIGHT PATELLA REVISION/REMOVAL;  Surgeon: Meredith Pel, MD;  Location: [facility];  Service: Orthopedics;  Laterality: Right;  . TRANSCATHETER AORTIC VALVE REPLACEMENT, TRANSFEMORAL N/A 03/04/2017   Procedure: TRANSCATHETER AORTIC VALVE REPLACEMENT, TRANSFEMORAL;  Surgeon: Burnell Blanks, MD;  Location: [facility];  Service: Open Heart Surgery;  Laterality: N/A;    Current Outpatient Medications  Medication Sig Dispense Refill  . aspirin 81 MG tablet Take 1 tablet (81 mg total) by mouth daily. 30 tablet 0  . atorvastatin (LIPITOR) 20 MG tablet TAKE 1 TABLET ONCE DAILY. 90 tablet 0  . Azelastine HCl 0.15 % SOLN Place 2 sprays into the nose in the morning and at bedtime. 30 mL 11  . benzonatate (TESSALON) 200 MG capsule TAKE 1 CAPSULE EVERY 8 HOURS AS NEEDED FOR COUGH. 45  capsule 0  . cetirizine (ZYRTEC) 10 MG tablet Take 10 mg by mouth daily.    . chlorpheniramine (CHLOR-TRIMETON) 4 MG tablet Take 2 tablets (8 mg total) by mouth at bedtime. 60 tablet 12  . colchicine 0.6 MG tablet 1 po tid x 1 day 1 po bid x 2 days Then daily for 5 days 30 tablet 0  . DYMISTA 137-50 MCG/ACT SUSP USE 2 SPRAYS EACH NOSTRIL TWICE DAILY. 23 g 0  . EPINEPHrine 0.3 mg/0.3 mL IJ SOAJ injection Inject 0.3 mLs (0.3 mg total) into the muscle as needed for anaphylaxis. 2 Device 1  . famotidine (PEPCID) 10 MG tablet Take 10 mg by mouth at bedtime.    . fluticasone (FLONASE) 50 MCG/ACT nasal spray Place 2 sprays into both nostrils daily. 16 g 11  . furosemide (LASIX) 80  MG tablet Take 1 tablet (80 mg total) by mouth 2 (two) times daily. TAKE ONE TABLET ONCE A DAY ALTERNATING WITH ONE TABLET TWICE A DAY. (Patient taking differently: Take 80 mg by mouth See admin instructions. Take 80 mg by mouth once a day every other day, alternating with twice daily to coincide with potassium) 180 tablet 3  . ipratropium (ATROVENT) 0.03 % nasal spray Place 2 sprays into both nostrils every 6 (six) hours as needed for rhinitis. 30 mL 4  . isosorbide mononitrate (IMDUR) 30 MG 24 hr tablet TAKE 1 TABLET ONCE DAILY. 90 tablet 0  . Ketotifen Fumarate (ZADITOR OP) Apply 1 drop to eye daily as needed (itchy eyes).     [name] levothyroxine (SYNTHROID) 100 MCG tablet Take 1 tablet (100 mcg total) by mouth daily. 90 tablet 3  . losartan (COZAAR) 25 MG tablet TAKE 1 TABLET EACH DAY. (Patient taking differently: Take 25 mg by mouth daily. ) 90 tablet 3  . methocarbamol (ROBAXIN) 500 MG tablet Take 1 tablet (500 mg total) by mouth every 8 (eight) hours as needed for muscle spasms. 30 tablet 0  . metoprolol succinate (TOPROL XL) 50 MG 24 hr tablet Take 1 tablet (50 mg total) by mouth daily. Take with or immediately following a meal. 90 tablet 3  . montelukast (SINGULAIR) 10 MG tablet TAKE 1 TABLET ONCE DAILY IN THE EVENING. 90  tablet 3  . Multiple Vitamins-Minerals (PRESERVISION AREDS PO) Take 2 tablets by mouth every morning.    [name] omeprazole (PRILOSEC) 20 MG capsule TAKE (1) CAPSULE TWICE DAILY. 180 capsule 0  . potassium chloride SA (K-DUR) 20 MEQ tablet Take 2 tablets by mouth twice daily alternating with 2 tablets daily. (Patient taking differently: Take 40 mEq by mouth See admin instructions. Take 40 meq by mouth once a day, alternating with twice daily to coincide with furosemide) 270 tablet 3  . PRADAXA 150 MG CAPS capsule TAKE (1) CAPSULE TWICE DAILY. 180 capsule 1  . triamcinolone cream (KENALOG) 0.1 % Apply 1 application topically daily as needed for dry skin.    . valACYclovir (VALTREX) 500 MG tablet Take 500 mg by mouth 2 (two) times daily as needed (cold sores).     No current facility-administered medications for this visit.    Allergies  Allergen Reactions  . Peanut-Containing Drug Products Anaphylaxis  . Sulfonamide Derivatives Anaphylaxis  . Amlodipine Swelling    Swelling in ankles  . Eliquis [Apixaban] Other (See Comments)    Back/hip pain  . Lisinopril Cough    cough  . Xarelto [Rivaroxaban] Other (See Comments)    Back/hip pain    Social History   Socioeconomic History  . Marital status: Married    Spouse name: Not on file  . Number of children: 3  . Years of education: Not on file  . Highest education level: Not on file  Occupational History  . Occupation: Clinical biochemist    Comment: builds malls  Tobacco Use  . Smoking status: Former Smoker    Packs/day: 3.50    Years: 13.00    Pack years: 45.50    Types: Cigarettes    Quit date: 1963    Years since quitting: 58.1  . Smokeless tobacco: Never Used  Substance and Sexual Activity  . Alcohol use: Yes    Alcohol/week: 0.0 standard drinks    Comment: 01/07/2017 "might have 2 beers 2-3 times/month"  . Drug use: No  . Sexual activity: Never  Other Topics Concern  .  Not on file  Social History Narrative   FH of CAD,  Male 1st degree relative less than age 62.   Social Determinants of Health   Financial Resource Strain:   . Difficulty of Paying Living Expenses: Not on file  Food Insecurity:   . Worried About Charity fundraiser in the Last Year: Not on file  . Ran Out of Food in the Last Year: Not on file  Transportation Needs:   . Lack of Transportation (Medical): Not on file  . Lack of Transportation (Non-Medical): Not on file  Physical Activity:   . Days of Exercise per Week: Not on file  . Minutes of Exercise per Session: Not on file  Stress:   . Feeling of Stress : Not on file  Social Connections:   . Frequency of Communication with Friends and Family: Not on file  . Frequency of Social Gatherings with Friends and Family: Not on file  . Attends Religious Services: Not on file  . Active Member of Clubs or Organizations: Not on file  . Attends Archivist Meetings: Not on file  . Marital Status: Not on file  Intimate Partner Violence:   . Fear of Current or Ex-Partner: Not on file  . Emotionally Abused: Not on file  . Physically Abused: Not on file  . Sexually Abused: Not on file    Family History  Problem Relation Age of Onset  . Heart attack Father 39  . Allergic rhinitis Father   . Asthma Father   . Heart failure Mother 23  . Uterine cancer Mother   . Breast cancer Mother   . Colon cancer Neg Hx   . Esophageal cancer Neg Hx     Review of Systems:  As stated in the HPI and otherwise negative.   BP 112/60   Pulse 84   Ht 5\' 5"  (1.651 m)   Wt 260 lb (117.9 kg)   SpO2 97%   BMI 43.27 kg/m   Physical Examination:  General: Well developed, well nourished, NAD  HEENT: OP clear, mucus membranes moist  SKIN: warm, dry. No rashes. Neuro: No focal deficits  Musculoskeletal: Muscle strength 5/5 all ext  Psychiatric: Mood and affect normal  Neck: No JVD, no carotid bruits, no thyromegaly, no lymphadenopathy.  Lungs:Clear bilaterally, no wheezes, rhonci, crackles  Cardiovascular: Regular rate and rhythm. No murmurs, gallops or rubs. Abdomen:Soft. Bowel sounds present. Non-tender.  Extremities: No lower extremity edema. Pulses are 2 + in the bilateral DP/PT.  Echo September 2020:  1. The left ventricle has moderately reduced systolic function, with an ejection fraction of 35-40%. The cavity size was normal. There is moderately increased left ventricular wall thickness. Left ventricular diastolic Doppler parameters are consistent  with impaired relaxation. Indeterminate filling pressures.  2. Severe hypokinesis of the left ventricular, entire anterior wall, anteroseptal wall and apical segment.  3. The right ventricle has moderately reduced systolic function. The cavity was normal. There is no increase in right ventricular wall thickness.  4. Left atrial size was mildly dilated.  5. Right atrial size was mildly dilated.  6. The mitral valve is abnormal. Mild thickening of the mitral valve leaflet. There is mild to moderate mitral annular calcification present.  7. The tricuspid valve is grossly normal.  8. A 4mm an Wende Crease Sapien bioprosthetic aortic valve (TAVR) valve is present in the aortic position. Procedure Date: 03/13/2019 Normal aortic valve prosthesis.  9. No stenosis of the aortic valve. 10. The aorta is  abnormal unless otherwise noted. 11. There is mild dilatation of the ascending aorta measuring 40 mm. 12. When compared to the prior study: 02/06/2018: LVEF 40-45%, TAVR mean gradient 9 mmHg, aorta 3.9 cm.   Cardiac cath 08/27/16: Diagnostic Diagram          EKG:  EKG is ordered today The ekg ordered today demonstrates Ventricular paced, PvC, rate 84 bpm.   Recent Labs: 03/11/2019: ALT 20 05/12/2019: BUN 17; Creatinine, Ser 0.82; Hemoglobin 13.3; Platelets 205; Potassium 3.5; Sodium 137 05/31/2019: TSH 3.69   Lipid Panel    Component Value Date/Time   CHOL 115 03/11/2019 0858   CHOL 171 08/11/2018 0735   TRIG 170.0 (H)  03/11/2019 0858   TRIG 143 06/30/2006 1132   HDL 31.30 (L) 03/11/2019 0858   HDL 34 (L) 08/11/2018 0735   CHOLHDL 4 03/11/2019 0858   VLDL 34.0 03/11/2019 0858   LDLCALC 49 03/11/2019 0858   LDLCALC 73 08/11/2018 0735     Wt Readings from Last 3 Encounters:  10/22/19 260 lb (117.9 kg)  10/18/19 262 lb 3.2 oz (118.9 kg)  07/02/19 262 lb 3.2 oz (118.9 kg)     Other studies Reviewed: Additional studies/ records that were reviewed today include:  Review of the above records demonstrates:    Assessment and Plan:   1. CAD s/p CABG with angina: He has no chest pain. Will continue ASA, statin, Imdur and beta blocker. He was mistakenly taking Toprol 50 mg BID. Will reduce down to 50 mg once daily.      2. Aortic valve stenosis: He is s/p TAVR in July 2018. Continue SBE prophylaxis as indicated. He is on Pradaxa.   3. HTN: BP is controlled. Continue current therapy  4. HLD: Lipids well controlled. LDL 49 in July 2020. Continue statin.   5. Chronic systolic and diastolic CHF: Weight is stable. No volume overload. Continue Lasix.   6. Atrial fibrillation, paroxysmal: He is in sinus today. Continue beta blocker and Pradaxa.    7. Ischemic cardiomyopathy: ICD in place. Will continue Toprol and Losartan.   8. Carotid artery disease/Visual changes/Possible TIA: Will arrange carotid artery dopplers to exclude progression of carotid artery disease. He has mild carotid disease by dopplers in 2018.    Current medicines are reviewed at length with the patient today.  The patient does not have concerns regarding medicines.  The following changes have been made:  no change  Labs/ tests ordered today include:   Orders Placed This Encounter  Procedures  . EKG 12-Lead  . VAS US CAROTID   Disposition:  Follow up with me in 6 months.   Signed, [name], MD 10/22/2019 11:10 AM    [address], [place], [place]  [address] Phone: [phone];  Fax: [phone]

## 2019-10-22 ENCOUNTER — Ambulatory Visit: Payer: Medicare Other | Admitting: Cardiovascular Disease

## 2019-10-22 ENCOUNTER — Encounter: Payer: Self-pay | Admitting: Cardiovascular Disease

## 2019-10-22 ENCOUNTER — Other Ambulatory Visit: Payer: Self-pay

## 2019-10-22 VITALS — BP 112/60 | HR 84 | Ht 65.0 in | Wt 260.0 lb

## 2019-10-22 DIAGNOSIS — I10 Essential (primary) hypertension: Secondary | ICD-10-CM | POA: Diagnosis not present

## 2019-10-22 DIAGNOSIS — I6523 Occlusion and stenosis of bilateral carotid arteries: Secondary | ICD-10-CM

## 2019-10-22 DIAGNOSIS — I255 Ischemic cardiomyopathy: Secondary | ICD-10-CM

## 2019-10-22 DIAGNOSIS — I25118 Atherosclerotic heart disease of native coronary artery with other forms of angina pectoris: Secondary | ICD-10-CM | POA: Diagnosis not present

## 2019-10-22 DIAGNOSIS — I350 Nonrheumatic aortic (valve) stenosis: Secondary | ICD-10-CM

## 2019-10-22 NOTE — Patient Instructions (Signed)
Medication Instructions:  Your provider recommends that you continue on your current medications as directed. Please refer to the Current Medication list given to you today.   *If you need a refill on your cardiac medications before your next appointment, please call your pharmacy*  Testing/Procedures: Your physician has requested that you have a carotid duplex. This test is an ultrasound of the carotid arteries in your neck. It looks at blood flow through these arteries that supply the brain with blood. Allow one hour for this exam. There are no restrictions or special instructions.  Follow-Up: At [facility], you and your health needs are our priority.  As part of our continuing mission to provide you with exceptional heart care, we have created designated Provider Care Teams.  These Care Teams include your primary Cardiologist (physician) and Advanced Practice Providers (APPs -  Physician Assistants and Nurse Practitioners) who all work together to provide you with the care you need, when you need it. Your next appointment:   6 month(s) The format for your next appointment:   In Person Provider:   You may see [name], MD or one of the following Advanced Practice Providers on your designated Care Team:    Melina Copa, PA-C  Ermalinda Barrios, PA-C

## 2019-10-25 ENCOUNTER — Ambulatory Visit (INDEPENDENT_AMBULATORY_CARE_PROVIDER_SITE_OTHER): Payer: Medicare Other

## 2019-10-25 DIAGNOSIS — Z95810 Presence of automatic (implantable) cardiac defibrillator: Secondary | ICD-10-CM | POA: Diagnosis not present

## 2019-10-25 DIAGNOSIS — J309 Allergic rhinitis, unspecified: Secondary | ICD-10-CM | POA: Diagnosis not present

## 2019-10-25 DIAGNOSIS — I5020 Unspecified systolic (congestive) heart failure: Secondary | ICD-10-CM

## 2019-10-28 DIAGNOSIS — L570 Actinic keratosis: Secondary | ICD-10-CM | POA: Diagnosis not present

## 2019-10-28 DIAGNOSIS — L853 Xerosis cutis: Secondary | ICD-10-CM | POA: Diagnosis not present

## 2019-10-29 NOTE — Progress Notes (Signed)
EPIC Encounter for ICM Monitoring  Patient Name: Justin [name] is a 86 y.o. male Date: 10/29/2019 Primary Care Physican: Laurey Morale, MD Primary Cardiologist:[place] Electrophysiologist:Klein Bi-V Pacing:81.9% 10/29/2019 Weight: 250-252 lbs   Spoke with patient and reports feeling well at this time.  Denies fluid symptoms.    Optivol thoracic impedancenormal.  Prescribed: Furosemide80 mgtaketake 1 tablet once a day alternating with one twice a day.  Recommendations:No changes and encouraged to call if experiencing any fluid symptoms.  Follow-up plan: ICM clinic phone appointment on4/12/2018. 91 day device clinic remote transmission 01/10/2020. Office visit with Oda Kilts, PA on 11/09/2019  Copy of ICM check sent to [facility].   3 month ICM trend: 10/25/2019    1 Year ICM trend:       [name], RN 10/29/2019 4:53 PM

## 2019-11-02 ENCOUNTER — Ambulatory Visit (INDEPENDENT_AMBULATORY_CARE_PROVIDER_SITE_OTHER): Payer: Medicare Other

## 2019-11-02 DIAGNOSIS — J309 Allergic rhinitis, unspecified: Secondary | ICD-10-CM | POA: Diagnosis not present

## 2019-11-03 ENCOUNTER — Other Ambulatory Visit (HOSPITAL_COMMUNITY): Payer: Self-pay | Admitting: Family Medicine

## 2019-11-08 NOTE — Progress Notes (Signed)
Electrophysiology Office Note Date: 11/09/2019  ID:  Hursel, Mcnell 1933/11/16, MRN [id number]  PCP: Laurey Morale, MD Primary Cardiologist: [name], MD Electrophysiologist: Dr. [name]  CC: Routine ICD follow-up  Justin [name] is a 86 y.o. male seen today for Dr. [name].  They present today for routine electrophysiology followup.  Since last being seen in our clinic, the patient reports doing very well. They deny chest pain, palpitations, dyspnea, PND, orthopnea, nausea, vomiting, dizziness, syncope, edema, weight gain, or early satiety.  He has not had ICD shocks.   He had been taking Toprol 50 mg BID by mistake (was previously on lopressor BID and made the switch but didn't decrease to daily). He felt "washed out" during this time. More energy now off.   Device History: Medtronic BiV ICD implanted 04/2017 for syncope and new LBBB following TAVR History of appropriate therapy: No History of AAD therapy: No   Past Medical History:  Diagnosis Date  . Age-related macular degeneration, dry, both eyes   . Allergic    "24/7; 365 days/year; I'm allergic to pollens, dust, all southern grasses/trees, mold, mildue, cats, dogs" (01/07/2017)  . Anal fissure   . Asthma    sees [name]   . Benign prostatic hypertrophy    (sees Dr. Denman George  . CAD (coronary artery disease)    a. s/p CABG 2007. b. Cath 08/2016 - 4/4 patent grafts.  . Carotid bruit    carotid u/s 10/10: 0.39% bilaterally  . Chronic combined systolic and diastolic CHF (congestive heart failure) ([place])   . Complication of anesthesia 1980s   "w/anal cyst OR, he gave me a saddle block then put a narcotic in spinal cord; had a severe reaction to that" (01/07/2017)  . Congestive heart failure (CHF) ([place])   . ED (erectile dysfunction)   . Family history of adverse reaction to anesthesia    "daughter wakes up during OR" (01/07/2017)  . GERD (gastroesophageal reflux disease)   . Gout   . HTN (hypertension)     . Hx of colonic polyps    (sees Dr. Henrene Pastor)  . Hyperlipidemia   . Hypothyroidism   . Moderate to severe aortic stenosis    a. s/p TAVR 02/2017.  [name] Myocardial infarction ([place]) ~ 2000  . Obesity   . Osteoarthritis    "was in my knees, hands" (01/07/2017 )  . PAF (paroxysmal atrial fibrillation) ([place])    a. documented post TAVR.  [name] Precancerous skin lesion    (sees Dr. Allyson Sabal)  . Prostate cancer ([place]) dx'd ~ 2014  . 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 ANUS SURGERY     "opened it back up cause it wouldn't heal; wound up w/a fissure" (01/07/2017)  . BIV ICD INSERTION CRT-D N/A 05/02/2017   Procedure: BIV ICD INSERTION CRT-D;  Surgeon: Deboraha Sprang, MD;  Location: [place] [facility];  Service: Cardiovascular;  Laterality: N/A;  . CARDIAC CATHETERIZATION  10/29/2005  . CARDIAC CATHETERIZATION N/A 08/27/2016   Procedure: Right/Left Heart Cath and Coronary/Graft Angiography;  Surgeon: Burnell Blanks, MD;  Location: [place] [facility];  Service: Cardiovascular;  Laterality: N/A;  . CATARACT EXTRACTION W/ INTRAOCULAR LENS  IMPLANT, BILATERAL Bilateral   . CATARACT EXTRACTION, BILATERAL  2012  . COLONOSCOPY  06/30/2008  no repeats needed   . COLONOSCOPY     had 3 or 4 in the past   . CORONARY ARTERY BYPASS GRAFT  2007   "CABG X4"  . CYST EXCISION PERINEAL  1980s  . HAMMER TOE SURGERY Bilateral   . JOINT REPLACEMENT    . KNEE ARTHROPLASTY  07/30/2011   Procedure: COMPUTER ASSISTED TOTAL KNEE ARTHROPLASTY;  Surgeon: Meredith Pel;  Location: [place] [place];  Service: Orthopedics;  Laterality: Left;  left total knee arthroplasty  . MASTECTOMY SUBCUTANEOUS Bilateral   . MULTIPLE TOOTH EXTRACTIONS    . ORIF FINGER / THUMB FRACTURE Right ~ 1980   "repair of thumb injury"  . PROSTATE  BIOPSY    . REPLACEMENT TOTAL KNEE BILATERAL Bilateral 2012  . TEE WITHOUT CARDIOVERSION N/A 03/04/2017   Procedure: TRANSESOPHAGEAL ECHOCARDIOGRAM (TEE);  Surgeon: Burnell Blanks, MD;  Location: [place];  Service: Open Heart Surgery;  Laterality: N/A;  . TOTAL KNEE REVISION Right 05/18/2019   Procedure: RIGHT PATELLA REVISION/REMOVAL;  Surgeon: Meredith Pel, MD;  Location: [place];  Service: Orthopedics;  Laterality: Right;  . TRANSCATHETER AORTIC VALVE REPLACEMENT, TRANSFEMORAL N/A 03/04/2017   Procedure: TRANSCATHETER AORTIC VALVE REPLACEMENT, TRANSFEMORAL;  Surgeon: Burnell Blanks, MD;  Location: [place];  Service: Open Heart Surgery;  Laterality: N/A;    Current Outpatient Medications  Medication Sig Dispense Refill  . aspirin 81 MG tablet Take 1 tablet (81 mg total) by mouth daily. 30 tablet 0  . atorvastatin (LIPITOR) 20 MG tablet TAKE 1 TABLET ONCE DAILY. 90 tablet 0  . Azelastine HCl 0.15 % SOLN Place 2 sprays into the nose in the morning and at bedtime. 30 mL 11  . benzonatate (TESSALON) 200 MG capsule TAKE 1 CAPSULE EVERY 8 HOURS AS NEEDED FOR COUGH. 45 capsule 0  . cetirizine (ZYRTEC) 10 MG tablet Take 10 mg by mouth daily.    . chlorpheniramine (CHLOR-TRIMETON) 4 MG tablet Take 2 tablets (8 mg total) by mouth at bedtime. 60 tablet 12  . colchicine 0.6 MG tablet 1 po tid x 1 day 1 po bid x 2 days Then daily for 5 days 30 tablet 0  . DYMISTA 137-50 MCG/ACT SUSP USE 2 SPRAYS EACH NOSTRIL TWICE DAILY. 23 g 0  . EPINEPHrine 0.3 mg/0.3 mL IJ SOAJ injection Inject 0.3 mLs (0.3 mg total) into the muscle as needed for anaphylaxis. 2 Device 1  . famotidine (PEPCID) 10 MG tablet Take 10 mg by mouth at bedtime.    . fluticasone (FLONASE) 50 MCG/ACT nasal spray Place 2 sprays into both nostrils daily. 16 g 11  . furosemide (LASIX) 80 MG tablet Take 1 tablet (80 mg total) by mouth 2 (two) times daily. TAKE ONE TABLET ONCE A DAY ALTERNATING WITH ONE TABLET TWICE A DAY. (Patient  taking differently: Take 80 mg by mouth See admin instructions. Take 80 mg by mouth once a day every other day, alternating with twice daily to coincide with potassium) 180 tablet 3  . ipratropium (ATROVENT) 0.03 % nasal spray Place 2 sprays into both nostrils every 6 (six) hours as needed for rhinitis. 30 mL 4  . isosorbide mononitrate (IMDUR) 30 MG 24 hr tablet TAKE 1 TABLET ONCE DAILY. 90 tablet 0  . Ketotifen Fumarate (ZADITOR OP) Apply 1 drop to eye daily as needed (itchy eyes).     [name] levothyroxine (SYNTHROID) 100 MCG tablet Take 1 tablet (100 mcg total) by mouth daily. 90 tablet 3  . losartan (COZAAR) 25 MG tablet  TAKE 1 TABLET EACH DAY. (Patient taking differently: Take 25 mg by mouth daily. ) 90 tablet 3  . methocarbamol (ROBAXIN) 500 MG tablet Take 1 tablet (500 mg total) by mouth every 8 (eight) hours as needed for muscle spasms. 30 tablet 0  . metoprolol succinate (TOPROL XL) 50 MG 24 hr tablet Take 1 tablet (50 mg total) by mouth daily. Take with or immediately following a meal. 90 tablet 3  . montelukast (SINGULAIR) 10 MG tablet TAKE 1 TABLET ONCE DAILY IN THE EVENING. 90 tablet 3  . Multiple Vitamins-Minerals (PRESERVISION AREDS PO) Take 2 tablets by mouth every morning.    [name] omeprazole (PRILOSEC) 20 MG capsule TAKE (1) CAPSULE TWICE DAILY. 180 capsule 0  . potassium chloride SA (K-DUR) 20 MEQ tablet Take 2 tablets by mouth twice daily alternating with 2 tablets daily. (Patient taking differently: Take 40 mEq by mouth See admin instructions. Take 40 meq by mouth once a day, alternating with twice daily to coincide with furosemide) 270 tablet 3  . PRADAXA 150 MG CAPS capsule TAKE (1) CAPSULE TWICE DAILY. 180 capsule 1  . triamcinolone cream (KENALOG) 0.1 % Apply 1 application topically daily as needed for dry skin.    . valACYclovir (VALTREX) 500 MG tablet Take 500 mg by mouth 2 (two) times daily as needed (cold sores).     No current facility-administered medications for this visit.     Allergies:   Peanut-containing drug products, Sulfonamide derivatives, Amlodipine, Eliquis [apixaban], Lisinopril, and Xarelto [rivaroxaban]   Social History: Social History   Socioeconomic History  . Marital status: Married    Spouse name: Not on file  . Number of children: 3  . Years of education: Not on file  . Highest education level: Not on file  Occupational History  . Occupation: Clinical biochemist    Comment: builds malls  Tobacco Use  . Smoking status: Former Smoker    Packs/day: 3.50    Years: 13.00    Pack years: 45.50    Types: Cigarettes    Quit date: 1963    Years since quitting: 58.2  . Smokeless tobacco: Never Used  Substance and Sexual Activity  . Alcohol use: Yes    Alcohol/week: 0.0 standard drinks    Comment: 01/07/2017 "might have 2 beers 2-3 times/month"  . Drug use: No  . Sexual activity: Never  Other Topics Concern  . Not on file  Social History Narrative   FH of CAD, Male 1st degree relative less than age 6.   Social Determinants of Health   Financial Resource Strain:   . Difficulty of Paying Living Expenses:   Food Insecurity:   . Worried About Charity fundraiser in the Last Year:   . Arboriculturist in the Last Year:   Transportation Needs:   . Film/video editor (Medical):   [name] Lack of Transportation (Non-Medical):   Physical Activity:   . Days of Exercise per Week:   . Minutes of Exercise per Session:   Stress:   . Feeling of Stress :   Social Connections:   . Frequency of Communication with Friends and Family:   . Frequency of Social Gatherings with Friends and Family:   . Attends Religious Services:   . Active Member of Clubs or Organizations:   . Attends Archivist Meetings:   [name] Marital Status:   Intimate Partner Violence:   . Fear of Current or Ex-Partner:   . Emotionally Abused:   .  Physically Abused:   . Sexually Abused:     Family History: Family History  Problem Relation Age of Onset  . Heart  attack Father 38  . Allergic rhinitis Father   . Asthma Father   . Heart failure Mother 35  . Uterine cancer Mother   . Breast cancer Mother   . Colon cancer Neg Hx   . Esophageal cancer Neg Hx     Review of Systems: All other systems reviewed and are otherwise negative except as noted above.   Physical Exam: Vitals:   11/09/19 0759  BP: 122/64  Pulse: 78  SpO2: 95%  Weight: 260 lb 9.6 oz (118.2 kg)  Height: 5\' 5"  (1.651 m)     GEN- The patient is well appearing, alert and oriented x 3 today.   HEENT: normocephalic, atraumatic; sclera clear, conjunctiva pink; hearing intact; oropharynx clear; neck supple, no JVP Lymph- no cervical lymphadenopathy Lungs- Clear to ausculation bilaterally, normal work of breathing.  No wheezes, rales, rhonchi Heart- Regular rate and rhythm, no murmurs, rubs or gallops, PMI not laterally displaced GI- soft, non-tender, non-distended, bowel sounds present, no hepatosplenomegaly Extremities- no clubbing, cyanosis, or edema; DP/PT/radial pulses 2+ bilaterally MS- no significant deformity or atrophy Skin- warm and dry, no rash or lesion; ICD pocket well healed Psych- euthymic mood, full affect Neuro- strength and sensation are intact  ICD interrogation- reviewed in detail today,  See PACEART report  EKG:  EKG is not ordered today.  Recent Labs: 03/11/2019: ALT 20 05/12/2019: BUN 17; Creatinine, Ser 0.82; Hemoglobin 13.3; Platelets 205; Potassium 3.5; Sodium 137 05/31/2019: TSH 3.69   Wt Readings from Last 3 Encounters:  11/09/19 260 lb 9.6 oz (118.2 kg)  10/22/19 260 lb (117.9 kg)  10/18/19 262 lb 3.2 oz (118.9 kg)     Other studies Reviewed: Additional studies/ records that were reviewed today include: previous office notes, previous EP notes, previous remotes, previous lab work.   Assessment and Plan:  1.  Chronic systolic dysfunction s/p Medtronic CRT-D  Echo 04/2019 with LVEF 35-40% euvolemic today Continue lasix 80 mg daily  alternating with 80 mg BID Normal ICD function See Pace Art report No changes today Continue losartan. Under the new guidelines Justin [name] is the preferred agent. Discussed with patient would is very interested, but would like me to discuss with Dr. Angelena Form.  Continue toprol 50 mg daily.  He has chronically CRT pacing in the 80% range, which is actually improved from the upper 60-70 range.  2. CAD s/p CABG Denies ischemic symptoms Continue ASA, statin, and BB  3. Severe AS s/p TAVR LBBB/IVCD Stable  4. Morbidly obese Body mass index is 43.37 kg/m.   5. VT, prolonged NSVT Stable Labs today  6. PAF NSR today. Continue Pradaxa for CHA2DS2VASC of at least 5    Current medicines are reviewed at length with the patient today.   The patient does not have concerns regarding his medicines.  The following changes were made today:  none  Labs/ tests ordered today include:  Orders Placed This Encounter  Procedures  . CBC  . Basic Metabolic Panel (BMET)  . CUP PACEART INCLINIC DEVICE CHECK   Disposition:   Follow up with EP APP in 6 months  Signed, Shirley Friar, PA-C  11/09/2019 8:28 AM  [place] [address] [place] [address] [phone] (office) [phone] (fax)

## 2019-11-09 ENCOUNTER — Ambulatory Visit (HOSPITAL_COMMUNITY)
Admission: RE | Admit: 2019-11-09 | Discharge: 2019-11-09 | Disposition: A | Discharge status: Home / Self Care | Payer: Medicare Other | Source: Ambulatory Visit | Attending: Cardiology | Admitting: Cardiology

## 2019-11-09 ENCOUNTER — Ambulatory Visit: Payer: Medicare Other | Admitting: Student

## 2019-11-09 ENCOUNTER — Encounter: Payer: Self-pay | Admitting: Student

## 2019-11-09 ENCOUNTER — Ambulatory Visit (INDEPENDENT_AMBULATORY_CARE_PROVIDER_SITE_OTHER): Payer: Medicare Other

## 2019-11-09 ENCOUNTER — Encounter (HOSPITAL_COMMUNITY): Payer: Medicare Other

## 2019-11-09 ENCOUNTER — Other Ambulatory Visit: Payer: Self-pay

## 2019-11-09 VITALS — BP 122/64 | HR 78 | Ht 65.0 in | Wt 260.6 lb

## 2019-11-09 DIAGNOSIS — I480 Paroxysmal atrial fibrillation: Secondary | ICD-10-CM

## 2019-11-09 DIAGNOSIS — Z79899 Other long term (current) drug therapy: Secondary | ICD-10-CM

## 2019-11-09 DIAGNOSIS — I25118 Atherosclerotic heart disease of native coronary artery with other forms of angina pectoris: Secondary | ICD-10-CM

## 2019-11-09 DIAGNOSIS — I5020 Unspecified systolic (congestive) heart failure: Secondary | ICD-10-CM | POA: Diagnosis not present

## 2019-11-09 DIAGNOSIS — J309 Allergic rhinitis, unspecified: Secondary | ICD-10-CM

## 2019-11-09 DIAGNOSIS — I10 Essential (primary) hypertension: Secondary | ICD-10-CM

## 2019-11-09 DIAGNOSIS — I2510 Atherosclerotic heart disease of native coronary artery without angina pectoris: Secondary | ICD-10-CM | POA: Diagnosis not present

## 2019-11-09 DIAGNOSIS — I6523 Occlusion and stenosis of bilateral carotid arteries: Secondary | ICD-10-CM | POA: Diagnosis not present

## 2019-11-09 LAB — CBC
Hematocrit: 40.5 % (ref 37.5–51.0)
Hemoglobin: 13.1 g/dL (ref 13.0–17.7)
MCH: 27.1 pg (ref 26.6–33.0)
MCHC: 32.3 g/dL (ref 31.5–35.7)
MCV: 84 fL (ref 79–97)
Platelets: 224 10*3/uL (ref 150–450)
RBC: 4.84 x10E6/uL (ref 4.14–5.80)
RDW: 14.8 % (ref 11.6–15.4)
WBC: 9.9 10*3/uL (ref 3.4–10.8)

## 2019-11-09 LAB — BASIC METABOLIC PANEL
BUN/Creatinine Ratio: 18 (ref 10–24)
BUN: 16 mg/dL (ref 8–27)
CO2: 22 mmol/L (ref 20–29)
Calcium: 8.6 mg/dL (ref 8.6–10.2)
Chloride: 101 mmol/L (ref 96–106)
Creatinine, Ser: 0.88 mg/dL (ref 0.76–1.27)
GFR calc Af Amer: 92 mL/min/{1.73_m2} (ref >59)
GFR calc non Af Amer: 79 mL/min/{1.73_m2} (ref >59)
Glucose: 107 mg/dL — ABNORMAL HIGH (ref 65–99)
Potassium: 3.9 mmol/L (ref 3.5–5.2)
Sodium: 137 mmol/L (ref 134–144)

## 2019-11-09 LAB — CUP PACEART INCLINIC DEVICE CHECK
Battery Remaining Longevity: 35 mo
Battery Voltage: 2.95 V
Brady Statistic AP VP Percent: 3.16 %
Brady Statistic AP VS Percent: 0.23 %
Brady Statistic AS VP Percent: 88.26 %
Brady Statistic AS VS Percent: 8.35 %
Brady Statistic RA Percent Paced: 3.27 %
Brady Statistic RV Percent Paced: 81.12 %
Date Time Interrogation Session: 20210316082125
HighPow Impedance: 73 Ohm
Implantable Lead Implant Date: 20180907
Implantable Lead Implant Date: 20180907
Implantable Lead Implant Date: 20180907
Implantable Lead Location: 753858
Implantable Lead Location: 753859
Implantable Lead Location: 753860
Implantable Lead Model: 4396
Implantable Lead Model: 5076
Implantable Pulse Generator Implant Date: 20180907
Lead Channel Impedance Value: 342 Ohm
Lead Channel Impedance Value: 342 Ohm
Lead Channel Impedance Value: 437 Ohm
Lead Channel Impedance Value: 494 Ohm
Lead Channel Impedance Value: 532 Ohm
Lead Channel Impedance Value: 855 Ohm
Lead Channel Pacing Threshold Amplitude: 0.75 V
Lead Channel Pacing Threshold Amplitude: 0.75 V
Lead Channel Pacing Threshold Amplitude: 1.125 V
Lead Channel Pacing Threshold Pulse Width: 0.4 ms
Lead Channel Pacing Threshold Pulse Width: 0.4 ms
Lead Channel Pacing Threshold Pulse Width: 1 ms
Lead Channel Sensing Intrinsic Amplitude: 1.75 mV
Lead Channel Sensing Intrinsic Amplitude: 1.875 mV
Lead Channel Sensing Intrinsic Amplitude: 12.625 mV
Lead Channel Sensing Intrinsic Amplitude: 12.75 mV
Lead Channel Setting Pacing Amplitude: 1.5 V
Lead Channel Setting Pacing Amplitude: 2 V
Lead Channel Setting Pacing Amplitude: 2.5 V
Lead Channel Setting Pacing Pulse Width: 0.4 ms
Lead Channel Setting Pacing Pulse Width: 1 ms
Lead Channel Setting Sensing Sensitivity: 0.3 mV

## 2019-11-09 NOTE — Patient Instructions (Addendum)
Medication Instructions:  none *If you need a refill on your cardiac medications before your next appointment, please call your pharmacy*   Lab Work:  TODAY BMET CBC If you have labs (blood work) drawn today and your tests are completely normal, you will receive your results only by: [name] MyChart Message (if you have MyChart) OR . A paper copy in the mail If you have any lab test that is abnormal or we need to change your treatment, we will call you to review the results.   Testing/Procedures: none   Follow-Up: At [facility], you and your health needs are our priority.  As part of our continuing mission to provide you with exceptional heart care, we have created designated Provider Care Teams.  These Care Teams include your primary Cardiologist (physician) and Advanced Practice Providers (APPs -  Physician Assistants and Nurse Practitioners) who all work together to provide you with the care you need, when you need it.  We recommend signing up for the patient portal called "MyChart".  Sign up information is provided on this After Visit Summary.  MyChart is used to connect with patients for Virtual Visits (Telemedicine).  Patients are able to view lab/test results, encounter notes, upcoming appointments, etc.  Non-urgent messages can be sent to your provider as well.   To learn more about what you can do with MyChart, go to [email or web address].    Your next appointment:   6 months  The format for your next appointment:   In Person  Provider:   Oda Kilts, PA   Other Instructions Remote monitoring is used to monitor your ICD from home. This monitoring reduces the number of office visits required to check your device to one time per year. It allows [place] to keep an eye on the functioning of your device to ensure it is working properly. You are scheduled for a device check from home on 01/10/20. You may send your transmission at any time that day. If you have a wireless device, the  transmission will be sent automatically. After your physician reviews your transmission, you will receive a postcard with your next transmission date.

## 2019-11-11 ENCOUNTER — Other Ambulatory Visit: Payer: Self-pay | Admitting: Primary Care

## 2019-11-11 ENCOUNTER — Other Ambulatory Visit (HOSPITAL_COMMUNITY): Payer: Self-pay | Admitting: Family Medicine

## 2019-11-12 ENCOUNTER — Telehealth: Payer: Self-pay

## 2019-11-12 MED ORDER — ENTRESTO 24-26 MG PO TABS: 1.0000 | ORAL_TABLET | Freq: Two times a day (BID) | ORAL | 3 refills | Status: DC

## 2019-11-12 NOTE — Telephone Encounter (Signed)
I spoke to the patient with Andy's recommendation to stop Losartan and start Entresto 24/26 mg Twice Daily.  He will f/u with Pharm D in 10-14 days.

## 2019-11-12 NOTE — Telephone Encounter (Signed)
-----   Message from Shirley Friar, PA-C sent at 11/12/2019  8:15 AM EDT -----  Could we please switch his Losartan to Entresto 24/26 mg BID and have him see pharmacy in 10-14 days for repeat labs and continued med titration as tolerated?  Thank you! ----- Message ----- From: Burnell Blanks, MD Sent: 11/09/2019   3:55 PM EDT To: Shirley Friar, PA-C  That's fine with me Jonni Sanger. Thanks, chris ----- Message ----- From: Shirley Friar, Hershal Coria Sent: 11/09/2019   8:32 AM EDT To: Burnell Blanks, MD  Good morning! I saw this pleasant gentleman today. I discussed with him Delene Loll being the preferred agent by the most recent CHF guidelines, and he is interested in the switch. He wanted me to speak to you about it. I drew a BMET and if you think stable, we could switch him from losartan to low dose Entresto with pharmacy follow up for further med titration as tolerated and repeat labwork.  Thank you!

## 2019-11-15 ENCOUNTER — Other Ambulatory Visit (HOSPITAL_COMMUNITY): Payer: Self-pay | Admitting: Family Medicine

## 2019-11-15 ENCOUNTER — Ambulatory Visit (INDEPENDENT_AMBULATORY_CARE_PROVIDER_SITE_OTHER): Payer: Medicare Other

## 2019-11-15 DIAGNOSIS — J309 Allergic rhinitis, unspecified: Secondary | ICD-10-CM | POA: Diagnosis not present

## 2019-11-16 NOTE — Telephone Encounter (Signed)
Pt saw Justin [name] in Feb 2021 but Justin [name] original prescriber. Ok to fill?  Assessment & Plan:     ICD-10-CM   1. Non-seasonal allergic rhinitis due to other allergic trigger  J30.89     Allergic rhinitis, possibly also vasomotor rhinitis -Continue Flonase and ipratropium nasal sprays.  Will separate from combination of medication due to cost. -Continue Zyrtec daily. -Continue allergy shots and follow-up with Dr. Fredderick Phenix as prescribed -Adding Singulair once daily -Does not require inhalers.   Lung restriction, likely due to body habitus.  Dyspnea on exertion likely due to underlying cardiac disease, body habitus. -Recommend modest weight loss to maintain healthy weight.   RTC in 6 months.   Current Outpatient Medications:  .  aspirin 81 MG tablet, Take 1 tablet (81 mg total) by mouth daily., Disp: 30 tablet, Rfl: 0 .  atorvastatin (LIPITOR) 20 MG tablet, TAKE 1 TABLET ONCE DAILY., Disp: 90 tablet, Rfl: 0 .  benzonatate (TESSALON) 200 MG capsule, TAKE 1 CAPSULE EVERY 8 HOURS AS NEEDED FOR COUGH., Disp: 45 capsule, Rfl: 0 .  cetirizine (ZYRTEC) 10 MG tablet, Take 10 mg by mouth daily., Disp: , Rfl:  .  chlorpheniramine (CHLOR-TRIMETON) 4 MG tablet, Take 2 tablets (8 mg total) by mouth at bedtime., Disp: 60 tablet, Rfl: 12 .  colchicine 0.6 MG tablet, 1 po tid x 1 day 1 po bid x 2 days Then daily for 5 days, Disp: 30 tablet, Rfl: 0 .  DYMISTA 137-50 MCG/ACT SUSP, USE 2 SPRAYS EACH NOSTRIL TWICE DAILY., Disp: 23 g, Rfl: 0 .  EPINEPHrine 0.3 mg/0.3 mL IJ SOAJ injection, Inject 0.3 mLs (0.3 mg total) into the muscle as needed for anaphylaxis., Disp: 2 Device, Rfl: 1 .  famotidine (PEPCID) 10 MG tablet, Take 10 mg by mouth at bedtime., Disp: , Rfl:  .  furosemide (LASIX) 80 MG tablet, Take 1 tablet (80 mg total) by mouth 2 (two) times daily. TAKE ONE TABLET ONCE A DAY ALTERNATING WITH ONE TABLET TWICE A DAY. (Patient taking differently: Take 80 mg by mouth See admin  instructions. Take 80 mg by mouth once a day every other day, alternating with twice daily to coincide with potassium), Disp: 180 tablet, Rfl: 3 .  isosorbide mononitrate (IMDUR) 30 MG 24 hr tablet, TAKE 1 TABLET ONCE DAILY., Disp: 90 tablet, Rfl: 0 .  Ketotifen Fumarate (ZADITOR OP), Apply 1 drop to eye daily as needed (itchy eyes). , Disp: , Rfl:  .  levothyroxine (SYNTHROID) 100 MCG tablet, Take 1 tablet (100 mcg total) by mouth daily., Disp: 90 tablet, Rfl: 3 .  losartan (COZAAR) 25 MG tablet, TAKE 1 TABLET EACH DAY. (Patient taking differently: Take 25 mg by mouth daily. ), Disp: 90 tablet, Rfl: 3 .  methocarbamol (ROBAXIN) 500 MG tablet, Take 1 tablet (500 mg total) by mouth every 8 (eight) hours as needed for muscle spasms., Disp: 30 tablet, Rfl: 0 .  metoprolol succinate (TOPROL XL) 50 MG 24 hr tablet, Take 1 tablet (50 mg total) by mouth daily. Take with or immediately following a meal., Disp: 90 tablet, Rfl: 3 .  montelukast (SINGULAIR) 10 MG tablet, TAKE 1 TABLET ONCE DAILY IN THE EVENING., Disp: 90 tablet, Rfl: 3 .  Multiple Vitamins-Minerals (PRESERVISION AREDS PO), Take 2 tablets by mouth every morning., Disp: , Rfl:  .  omeprazole (PRILOSEC) 20 MG capsule, TAKE (1) CAPSULE TWICE DAILY., Disp: 180 capsule, Rfl: 0 .  potassium chloride SA (K-DUR) 20 MEQ tablet, Take 2 tablets by  mouth twice daily alternating with 2 tablets daily. (Patient taking differently: Take 40 mEq by mouth See admin instructions. Take 40 meq by mouth once a day, alternating with twice daily to coincide with furosemide), Disp: 270 tablet, Rfl: 3 .  PRADAXA 150 MG CAPS capsule, TAKE (1) CAPSULE TWICE DAILY., Disp: 180 capsule, Rfl: 1 .  triamcinolone cream (KENALOG) 0.1 %, Apply 1 application topically daily as needed for dry skin., Disp: , Rfl:  .  valACYclovir (VALTREX) 500 MG tablet, Take 500 mg by mouth 2 (two) times daily as needed (cold sores)., Disp: , Rfl:  .  Azelastine HCl 0.15 % SOLN, Place 2 sprays into the  nose in the morning and at bedtime., Disp: 30 mL, Rfl: 11 .  fluticasone (FLONASE) 50 MCG/ACT nasal spray, Place 2 sprays into both nostrils daily., Disp: 16 g, Rfl: 11 .  ipratropium (ATROVENT) 0.03 % nasal spray, Place 2 sprays into both nostrils every 6 (six) hours as needed for rhinitis. (Patient not taking: Reported on 10/18/2019), Disp: 30 mL, Rfl: 4

## 2019-11-16 NOTE — Telephone Encounter (Signed)
ok 

## 2019-11-19 ENCOUNTER — Telehealth: Payer: Self-pay | Admitting: Family Medicine

## 2019-11-19 ENCOUNTER — Telehealth: Payer: Self-pay | Admitting: Cardiovascular Disease

## 2019-11-19 NOTE — Telephone Encounter (Signed)
Pt was told to notify his PCP about his issue. He has been having pain on the right side above his hip. He states that the only thing that has changed is one of his medication--he is now taking Entresto 24mg -26mg  twice a day, he started taking it 3/19 . His heart doctor recommend that medication. He has not done anything different with his routine other than that medication. The pain is not constant, he stated he walked all over [place] yesterday and was fine. It hurts more at night but once he gets up and moves he is okay. It has been going on for the past four to five days.    Pt says he has an appt with the pharmacist on the 30th-to make sure it is not affecting his kidneys.   Pt can be reached at [phone]

## 2019-11-19 NOTE — Telephone Encounter (Signed)
I spoke to the patient who started [facility] 24-26 about 1 week ago.  He has developed intermittent right hip pain at times described as "sharp".  He is not in pain presently when we spoke.  He will also reach out to his PCP for advisement.  He has an appointment 3/30 with our Pharmacist to f/u on Entresto.

## 2019-11-19 NOTE — Telephone Encounter (Signed)
I do not suspect that this is related to [facility], but rather coincidental.    Please encourage him to keep his appointment 3/30 and let us know if he develops any additional symptoms such as lightheadedness/dizziness or marked decrease in urine output.   Legrand Como [address] [place], [place]  11/19/2019 8:37 AM

## 2019-11-19 NOTE — Telephone Encounter (Signed)
Please advise 

## 2019-11-19 NOTE — Telephone Encounter (Signed)
Pt c/o medication issue: 1. Name of Medication: Entresto  2. How are you currently taking this medication (dosage and times per day)? 2 times a day 3. Are you having a reaction (difficulty breathing--STAT)?  NO 4. What is your medication issue? At night patient has pain in his right side above the hip.

## 2019-11-19 NOTE — Telephone Encounter (Signed)
I spoke to the patient with Andy's recommendation.  The patient will monitor symptoms and update [place] on Monday.

## 2019-11-19 NOTE — Telephone Encounter (Signed)
This sounds like a muscular pain since it gets better when he moves around ibuprofen am sure it is not related to the [facility]

## 2019-11-20 NOTE — Telephone Encounter (Signed)
Pt has been notified of note and has understanding

## 2019-11-22 NOTE — Progress Notes (Signed)
Patient ID: SHERWOOD STENNIS                 DOB: [birth year]                      MRN: [id number]     HPI: Justin [name] is a 86 y.o. male referred by Oda Kilts, PA-C to HTN clinic. PMH is significant for moderate to severe aortic stenosis s/p TAVR LBBB/IVCD (2018), MI (2000), PAF, CAD s/p CABG x4 (2007), obesity, HTN, HLD, and CHF (ECHO 04/2019 EF 35-40%).   At last visit with Jonni Sanger on 11/09/19, patient reported he had been taking metoprolol succinate 50mg  twice daily by mistake (had been on metoprolol tartrate twice daily before). Patient reports improved energy since lowering dose to once daily. Patient was switched from losartan to [facility] 24/26mg  twice daily with plans to repeat labs and further titrate medications as tolerated. Patient called back a week later to report he has had sharp, intermittent pain in his right side above the hip at night since starting Entresto. Patient was encouraged to keep taking the medication and follow up if his symptoms get worse or if he experiences lightheadedness or dizziness or marked decrease in urine output. Baseline BMET 11/09/19 WNL.   Patient presents to clinic for follow up of heart failure medications. Patient reports some dizziness when standing up too quickly but much improved symptoms of fatigue since going to Toprol XL once daily. Patient endorses a healthy lifestyle as taught by the [facility] he has attended multiple times in [place] including regular walking and very limited salt. Patient has a blood pressure cuff at home but rarely uses it. States he thinks his hip pain is musclar in nature.  Current HTN/HF meds:  - Entresto 24-26mg  BID - metoprolol succinate 50mg  daily - furosemide 80mg  daily or BID - alternates days (takes twice daily on days he takes potassium) - isosorbide mononitrate 30mg  daily  Previously tried: losartan (switched to [place]), metoprolol tartrate (switched to metoprolol succinate)  BP goal:  <130/86mmHg LDL goal: <70  Family History: CAD (male 1st degree relative <50y)  Social History: former smoker (quit 1963, smoked 3.5 ppd), occasional alcohol use, denies drug use  Diet: patient has attended [facility] in [place], [place] seven times - endorses very limited salt intake, snacks at 10a and 3p  Exercise: walks regularly  Home BP readings: does not take at home  Labs: 11/09/19 BMET K 3.9, Scr 0.88 (prior to Entresto switch) 03/11/19 lipid panel TC 115, TG 170, HDL 31, LDL 49 (on atorvastatin 20mg  daily)  Wt Readings from Last 3 Encounters:  11/09/19 260 lb 9.6 oz (118.2 kg)  10/22/19 260 lb (117.9 kg)  10/18/19 262 lb 3.2 oz (118.9 kg)   BP Readings from Last 3 Encounters:  11/09/19 122/64  10/22/19 112/60  10/18/19 110/60   Pulse Readings from Last 3 Encounters:  11/09/19 78  10/22/19 84  10/18/19 75    Renal function: Estimated Creatinine Clearance: 75.7 mL/min (by C-G formula based on SCr of 0.88 mg/dL).  Past Medical History:  Diagnosis Date  . Age-related macular degeneration, dry, both eyes   . Allergic    "24/7; 365 days/year; I'm allergic to pollens, dust, all southern grasses/trees, mold, mildue, cats, dogs" (01/07/2017)  . Anal fissure   . Asthma    sees [name]   . Benign prostatic hypertrophy    (sees Dr. Denman George  . CAD (coronary artery disease)  a. s/p CABG 2007. b. Cath 08/2016 - 4/4 patent grafts.  . Carotid bruit    carotid u/s 10/10: 0.39% bilaterally  . Chronic combined systolic and diastolic CHF (congestive heart failure) ([place])   . Complication of anesthesia 1980s   "w/anal cyst OR, he gave me a saddle block then put a narcotic in spinal cord; had a severe reaction to that" (01/07/2017)  . Congestive heart failure (CHF) ([place])   . ED (erectile dysfunction)   . Family history of adverse reaction to anesthesia    "daughter wakes up during OR" (01/07/2017)  . GERD (gastroesophageal reflux disease)   . Gout   . HTN  (hypertension)   . Hx of colonic polyps    (sees Dr. Henrene Pastor)  . Hyperlipidemia   . Hypothyroidism   . Moderate to severe aortic stenosis    a. s/p TAVR 02/2017.  [name] Myocardial infarction ([place]) ~ 2000  . Obesity   . Osteoarthritis    "was in my knees, hands" (01/07/2017 )  . PAF (paroxysmal atrial fibrillation) ([place])    a. documented post TAVR.  [name] Precancerous skin lesion    (sees Dr. Allyson Sabal)  . Prostate cancer ([place]) dx'd ~ 2014  . 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 aspirin 81 MG tablet Take 1 tablet (81 mg total) by mouth daily. 30 tablet 0  . atorvastatin (LIPITOR) 20 MG tablet TAKE 1 TABLET ONCE DAILY. 90 tablet 0  . Azelastine HCl 0.15 % SOLN Place 2 sprays into the nose in the morning and at bedtime. 30 mL 11  . benzonatate (TESSALON) 200 MG capsule TAKE 1 CAPSULE EVERY 8 HOURS AS NEEDED FOR COUGH. 45 capsule 0  . cetirizine (ZYRTEC) 10 MG tablet Take 10 mg by mouth daily.    . chlorpheniramine (CHLOR-TRIMETON) 4 MG tablet Take 2 tablets (8 mg total) by mouth at bedtime. 60 tablet 12  . colchicine 0.6 MG tablet 1 po tid x 1 day 1 po bid x 2 days Then daily for 5 days 30 tablet 0  . DYMISTA 137-50 MCG/ACT SUSP USE 2 SPRAYS EACH NOSTRIL TWICE DAILY. 23 g 0  . EPINEPHrine 0.3 mg/0.3 mL IJ SOAJ injection Inject 0.3 mLs (0.3 mg total) into the muscle as needed for anaphylaxis. 2 Device 1  . famotidine (PEPCID) 10 MG tablet Take 10 mg by mouth at bedtime.    . fluticasone (FLONASE) 50 MCG/ACT nasal spray Place 2 sprays into both nostrils daily. 16 g 11  . furosemide (LASIX) 80 MG tablet Take 1 tablet (80 mg total) by mouth 2 (two) times daily. TAKE ONE TABLET ONCE A DAY ALTERNATING WITH ONE TABLET TWICE A DAY. (Patient taking differently: Take 80  mg by mouth See admin instructions. Take 80 mg by mouth once a day every other day, alternating with twice daily to coincide with potassium) 180 tablet 3  . ipratropium (ATROVENT) 0.03 % nasal spray Place 2 sprays into both nostrils every 6 (six) hours as needed for rhinitis. 30 mL 4  . isosorbide mononitrate (IMDUR) 30 MG 24 hr tablet TAKE 1 TABLET ONCE DAILY. 90 tablet 0  . Ketotifen Fumarate (ZADITOR OP) Apply 1 drop to eye daily as needed (itchy eyes).     [name] levothyroxine (SYNTHROID) 100 MCG  tablet Take 1 tablet (100 mcg total) by mouth daily. 90 tablet 3  . methocarbamol (ROBAXIN) 500 MG tablet Take 1 tablet (500 mg total) by mouth every 8 (eight) hours as needed for muscle spasms. 30 tablet 0  . metoprolol succinate (TOPROL XL) 50 MG 24 hr tablet Take 1 tablet (50 mg total) by mouth daily. Take with or immediately following a meal. 90 tablet 3  . montelukast (SINGULAIR) 10 MG tablet TAKE 1 TABLET ONCE DAILY IN THE EVENING. 90 tablet 3  . Multiple Vitamins-Minerals (PRESERVISION AREDS PO) Take 2 tablets by mouth every morning.    [name] omeprazole (PRILOSEC) 20 MG capsule TAKE (1) CAPSULE TWICE DAILY. 180 capsule 0  . potassium chloride SA (K-DUR) 20 MEQ tablet Take 2 tablets by mouth twice daily alternating with 2 tablets daily. (Patient taking differently: Take 40 mEq by mouth See admin instructions. Take 40 meq by mouth once a day, alternating with twice daily to coincide with furosemide) 270 tablet 3  . PRADAXA 150 MG CAPS capsule TAKE (1) CAPSULE TWICE DAILY. 180 capsule 1  . sacubitril-valsartan (ENTRESTO) 24-26 MG Take 1 tablet by mouth 2 (two) times daily. 90 tablet 3  . triamcinolone cream (KENALOG) 0.1 % Apply 1 application topically daily as needed for dry skin.    . valACYclovir (VALTREX) 500 MG tablet Take 500 mg by mouth 2 (two) times daily as needed (cold sores).     No current facility-administered medications on file prior to visit.    Allergies  Allergen Reactions  .  Peanut-Containing Drug Products Anaphylaxis  . Sulfonamide Derivatives Anaphylaxis  . Amlodipine Swelling    Swelling in ankles  . Eliquis [Apixaban] Other (See Comments)    Back/hip pain  . Lisinopril Cough    cough  . Xarelto [Rivaroxaban] Other (See Comments)    Back/hip pain     Assessment/Plan:  1. Hypertension/HF medication titration - Based on BP goal <130/80, patient is at goal with room to further titrate heart failure medications. Patient will continue Imdur 30mg  daily and furosemide 80mg  daily or BID (alternates days). Because of patient's elevated HR, we will increase metoprolol succinate dose to 75mg  daily as slow titration due to patient's previous fatigue from increasing metoprolol succinate too quickly. Patient to continue Entresto 24/26mg  BID pending BMET results from today. If normal, we will increase patient to [facility] 49/51mg  BID. We will follow up with lab results and schedule follow up visit accordingly. Patient was encouraged to use his home blood pressure cuff to take his blood pressure a few times a week and continue his current diet and exercise.   Unable to send in an electronic prescription for 1.5 tablets of metoprolol succinate 50mg  through Epic. Prescription was called in verbally to [facility].   2. Hyperlipidemia - Based on LDL goal <70, patient is at goal on atorvastatin 20mg  daily but would benefit from a high intensity statin due to extensive cardiac history. This was not addressed at this visit due to time constraints but at next visit will address increasing atorvastatin to 40mg  daily as long as patient has not had previous intolerances to this dose in the past.   Ladoris Gene, PharmD Candidate  Megan E. Supple, PharmD, BCACP, [place] [id number], [place], [place] [address] Phone: [phone]; Fax: [phone] 11/23/2019 11:07 AM

## 2019-11-23 ENCOUNTER — Ambulatory Visit (INDEPENDENT_AMBULATORY_CARE_PROVIDER_SITE_OTHER): Payer: Medicare Other | Admitting: Pharmacist

## 2019-11-23 ENCOUNTER — Other Ambulatory Visit: Payer: Self-pay

## 2019-11-23 ENCOUNTER — Ambulatory Visit (INDEPENDENT_AMBULATORY_CARE_PROVIDER_SITE_OTHER): Payer: Medicare Other

## 2019-11-23 VITALS — BP 122/60 | HR 83

## 2019-11-23 DIAGNOSIS — I10 Essential (primary) hypertension: Secondary | ICD-10-CM | POA: Diagnosis not present

## 2019-11-23 DIAGNOSIS — J309 Allergic rhinitis, unspecified: Secondary | ICD-10-CM | POA: Diagnosis not present

## 2019-11-23 DIAGNOSIS — I5020 Unspecified systolic (congestive) heart failure: Secondary | ICD-10-CM

## 2019-11-23 LAB — BASIC METABOLIC PANEL
BUN/Creatinine Ratio: 22 (ref 10–24)
BUN: 20 mg/dL (ref 8–27)
CO2: 22 mmol/L (ref 20–29)
Calcium: 8.7 mg/dL (ref 8.6–10.2)
Chloride: 101 mmol/L (ref 96–106)
Creatinine, Ser: 0.92 mg/dL (ref 0.76–1.27)
GFR calc Af Amer: 89 mL/min/{1.73_m2} (ref >59)
GFR calc non Af Amer: 77 mL/min/{1.73_m2} (ref >59)
Glucose: 113 mg/dL — ABNORMAL HIGH (ref 65–99)
Potassium: 4.3 mmol/L (ref 3.5–5.2)
Sodium: 138 mmol/L (ref 134–144)

## 2019-11-23 NOTE — Patient Instructions (Addendum)
It was great seeing you today!  Your blood pressure is at goal of <130/80 but still have some room to increase your medications to benefit your heart failure.   CONTINUE Entresto 24/26mg  twice daily  CONTINUE isosorbide mononitrate 30mg  daily  CONTINUE furosemide 80mg  once daily and twice daily when you take your potassium  INCREASE your metoprolol succinate to 75mg  once daily (feel free to take 1.5 tablets of your 50mg )   We will check lab work today and call you with your results tomorrow.

## 2019-11-24 ENCOUNTER — Telehealth: Payer: Self-pay

## 2019-11-24 DIAGNOSIS — H353221 Exudative age-related macular degeneration, left eye, with active choroidal neovascularization: Secondary | ICD-10-CM | POA: Diagnosis not present

## 2019-11-24 MED ORDER — ENTRESTO 49-51 MG PO TABS: 1.0000 | ORAL_TABLET | Freq: Two times a day (BID) | ORAL | 11 refills | Status: DC

## 2019-11-24 NOTE — Telephone Encounter (Signed)
Left VM for Justin [name] to call us back regarding his BMET results from yesterday. Because they are normal, we will increase his Entresto to 49/51mg  BID and follow up with him in clinic in 2 weeks for labs and a BP check.

## 2019-11-24 NOTE — Telephone Encounter (Signed)
Spoke with [name] over the phone who was agreeable to increasing his dose of Entresto to 49/51mg  BID. Patient is scheduled for follow up labs and BP check in 2 weeks.

## 2019-11-29 ENCOUNTER — Ambulatory Visit (INDEPENDENT_AMBULATORY_CARE_PROVIDER_SITE_OTHER): Payer: Medicare Other | Admitting: Family Medicine

## 2019-11-29 ENCOUNTER — Encounter: Payer: Self-pay | Admitting: Family Medicine

## 2019-11-29 ENCOUNTER — Telehealth: Payer: Self-pay | Admitting: Pharmacist

## 2019-11-29 ENCOUNTER — Ambulatory Visit (INDEPENDENT_AMBULATORY_CARE_PROVIDER_SITE_OTHER): Payer: Medicare Other

## 2019-11-29 ENCOUNTER — Other Ambulatory Visit: Payer: Self-pay

## 2019-11-29 VITALS — BP 134/62 | HR 80 | Temp 97.3°F | Wt 263.0 lb

## 2019-11-29 DIAGNOSIS — H6983 Other specified disorders of Eustachian tube, bilateral: Secondary | ICD-10-CM

## 2019-11-29 DIAGNOSIS — J309 Allergic rhinitis, unspecified: Secondary | ICD-10-CM | POA: Diagnosis not present

## 2019-11-29 MED ORDER — ENTRESTO 49-51 MG PO TABS: 1.0000 | ORAL_TABLET | Freq: Two times a day (BID) | ORAL | 3 refills | Status: DC

## 2019-11-29 MED ORDER — METOPROLOL SUCCINATE ER 50 MG PO TB24: 75.0000 mg | ORAL_TABLET | Freq: Every day | ORAL | 3 refills | Status: DC

## 2019-11-29 MED ORDER — METHYLPREDNISOLONE 4 MG PO TBPK: ORAL_TABLET | ORAL | 0 refills | Status: DC

## 2019-11-29 NOTE — Telephone Encounter (Signed)
Patient requesting refills on Entresto and metoprolol

## 2019-11-29 NOTE — Progress Notes (Signed)
   Subjective:    Patient ID: Justin [name], male    DOB: 30-Mar-1934, 86 y.o.   MRN: [id number]  HPI Here for one week of popping in the right ear. No pain or change in hearing. He is taking his usuall allergy medications inlcuding Zyrtec and Azelastine and Flonase sprays. He tried OTC ear was removal drops with no success.   Review of Systems  Constitutional: Negative.   HENT: Positive for congestion. Negative for ear discharge, ear pain, postnasal drip, sinus pressure, sore throat and tinnitus.   Eyes: Negative.   Respiratory: Negative.   Cardiovascular: Negative.        Objective:   Physical Exam Constitutional:      Appearance: Normal appearance.  HENT:     Right Ear: Tympanic membrane and external ear normal.     Left Ear: Tympanic membrane and external ear normal.     Ears:     Comments: Both canals have a small amount of cerumen present     Nose: Nose normal.     Mouth/Throat:     Pharynx: Oropharynx is clear.  Eyes:     Conjunctiva/sclera: Conjunctivae normal.  Cardiovascular:     Rate and Rhythm: Normal rate and regular rhythm.     Pulses: Normal pulses.     Heart sounds: Normal heart sounds.  Pulmonary:     Effort: Pulmonary effort is normal.     Breath sounds: Normal breath sounds.  Lymphadenopathy:     Cervical: No cervical adenopathy.  Neurological:     Mental Status: He is alert.           Assessment & Plan:  Eustachian tube dysfunction, treat with a Medrol dose pack.  [name], MD

## 2019-12-01 ENCOUNTER — Telehealth: Payer: Self-pay | Admitting: Cardiovascular Disease

## 2019-12-01 DIAGNOSIS — I2510 Atherosclerotic heart disease of native coronary artery without angina pectoris: Secondary | ICD-10-CM

## 2019-12-01 NOTE — Telephone Encounter (Signed)
New Message:   Pt says he needs Dr Angelena Form to contact the Heart Maintenance Program and let them know he can be in this program please.

## 2019-12-02 NOTE — Telephone Encounter (Signed)
Was stopped due to covid a year ago. The program was restarted. He is requesting an order to restart the maintenance program at cardiac rehab at [facility].

## 2019-12-03 NOTE — Progress Notes (Signed)
No ICM remote transmission received for 11/29/2019 and next ICM transmission scheduled for 12/13/2019.

## 2019-12-07 ENCOUNTER — Ambulatory Visit (INDEPENDENT_AMBULATORY_CARE_PROVIDER_SITE_OTHER): Payer: Medicare Other

## 2019-12-07 DIAGNOSIS — J309 Allergic rhinitis, unspecified: Secondary | ICD-10-CM

## 2019-12-07 NOTE — Progress Notes (Signed)
Stopped by Justin [name] after being directed by Cardiac Rehab. Looking for a way to continue exercising. Interested in PREP-explained goals of the 12 week period would be independent exercise by end of program. Would like to be called when next class start date is identified.  We exchanged contact information with each other.

## 2019-12-08 ENCOUNTER — Encounter: Payer: Self-pay | Admitting: Pharmacist

## 2019-12-08 ENCOUNTER — Ambulatory Visit (INDEPENDENT_AMBULATORY_CARE_PROVIDER_SITE_OTHER): Payer: Medicare Other | Admitting: Pharmacist

## 2019-12-08 ENCOUNTER — Other Ambulatory Visit: Payer: Self-pay

## 2019-12-08 VITALS — BP 118/60 | HR 86 | Ht 66.0 in | Wt 264.4 lb

## 2019-12-08 DIAGNOSIS — Z0000 Encounter for general adult medical examination without abnormal findings: Secondary | ICD-10-CM | POA: Diagnosis not present

## 2019-12-08 DIAGNOSIS — I428 Other cardiomyopathies: Secondary | ICD-10-CM

## 2019-12-08 DIAGNOSIS — I480 Paroxysmal atrial fibrillation: Secondary | ICD-10-CM

## 2019-12-08 MED ORDER — FUROSEMIDE 80 MG PO TABS: 80.0000 mg | ORAL_TABLET | Freq: Every day | ORAL | 1 refills | Status: DC

## 2019-12-08 MED ORDER — SPIRONOLACTONE 25 MG PO TABS: 12.5000 mg | ORAL_TABLET | Freq: Every day | ORAL | 1 refills | Status: DC

## 2019-12-08 NOTE — Assessment & Plan Note (Signed)
Blood pressure and HR well controlled and appropriate for further medication titration. Patient reports increased back pain after increasing Entresto to 49-51mg , but will continue taking medication as prescribed. Back pain is not an expected ADR of Entresto and patient is currently undergoing assessment for back pain by PCP. Noted increased fatigue when taking metoprolol succinate 100mg  daily in the past.  Will continue Entresto 49-51mg  BID, furosemide 80mg  daily (no PM dose in over 1 month), metoprolol succinate 75mg  daily and ADD spironolactone 12.5mg  daily to therapy. Noted patient is on potassium supplement, but baseline potassium only at 4.3.  Plan to repeat BMEt in 2 week and adjust K supplement if needed. I am not expected his BP to tolerate further titration after introducing low dose spironolactone. Will follow up in 3 week at pharmacist clinic for further assesment.

## 2019-12-08 NOTE — Patient Instructions (Addendum)
Return for a follow up appointment in 3 weeks  Go to the lab in 2 weeks  Check your blood pressure at home daily (if able) and keep record of the readings.  Take your BP meds as follows: *START taking spironolactone 12.5mg  daily*  Bring all of your meds, your BP cuff and your record of home blood pressures to your next appointment.  Exercise as you're able, try to walk approximately 30 minutes per day.  Keep salt intake to a minimum, especially watch canned and prepared boxed foods.  Eat more fresh fruits and vegetables and fewer canned items.  Avoid eating in fast food restaurants.    HOW TO TAKE YOUR BLOOD PRESSURE: . Rest 5 minutes before taking your blood pressure. .  Don't smoke or drink caffeinated beverages for at least 30 minutes before. . Take your blood pressure before (not after) you eat. . Sit comfortably with your back supported and both feet on the floor (don't cross your legs). . Elevate your arm to heart level on a table or a desk. . Use the proper sized cuff. It should fit smoothly and snugly around your bare upper arm. There should be enough room to slip a fingertip under the cuff. The bottom edge of the cuff should be 1 inch above the crease of the elbow. . Ideally, take 3 measurements at one sitting and record the average.

## 2019-12-08 NOTE — Progress Notes (Signed)
Patient ID: Justin [name]                 DOB: December 06, 1933                      MRN: [id number]     [id number] [name] is a 86 y.o. male referred by  Oda Kilts, PA-C to HTN clinic. PMH is significant for moderate to severe aortic stenosis s/p TAVR LBBB/IVCD (2018), MI (2000), PAF, CAD s/p CABG x4 (2007), obesity, HTN, HLD, and CHF (ECHO 04/2019 EF 35-40%).   At last visit with Jonni Sanger on 11/09/19, patient reported he had been taking metoprolol succinate 50mg  twice daily by mistake (had been on metoprolol tartrate twice daily before). Patient reports improved energy since lowering dose to once daily. Patient was switched from losartan to [facility] 24/26mg  twice daily with plans to repeat labs and further titrate medications as tolerated. Patient called back a week later to report he has had sharp, intermittent pain in his right side above the hip at night since starting Entresto. Patient was encouraged to keep taking the medication and follow up if his symptoms get worse or if he experiences lightheadedness or dizziness or marked decrease in urine output. Follow up BMET 11/23/2019 WNL.   Patient presents to clinic for follow up of heart failure medications. Patient endorses a healthy lifestyle as taught by the [facility] he has attended multiple times in [place] including regular walking and very limited salt.  Denies fluid retention, SOB or increase fatigue. Reports increased pain in lower back after increasing Entresto dose to 49/51. He will continue to take medication if needed. Already seen by PCP for pain assessment.   Current HTN meds:  Imdur 30mg  daily Furosemide 80mg  daily Metoprolol succinate 75mg  daily Entresto 49-51mg  BID (increased 11/29/2019)  BP goal: 130/80  Family History: CAD (male 1st degree relative <50y)  Social History: former smoker (quit 1963, smoked 3.5 ppd), occasional alcohol use, denies drug use  Diet: patient has attended [facility] in [place], [place] seven times - endorses very limited salt intake, snacks at 10a and 3p  Exercise: walks regularly  Home BP readings: none provided  Wt Readings from Last 3 Encounters:  12/08/19 264 lb 6.4 oz (119.9 kg)  11/29/19 263 lb (119.3 kg)  11/09/19 260 lb 9.6 oz (118.2 kg)   BP Readings from Last 3 Encounters:  12/08/19 118/60  11/29/19 134/62  11/23/19 122/60   Pulse Readings from Last 3 Encounters:  12/08/19 86  11/29/19 80  11/23/19 83    Renal function: Estimated Creatinine Clearance: 74.2 mL/min (by C-G formula based on SCr of 0.92 mg/dL).  Past Medical History:  Diagnosis Date  . Age-related macular degeneration, dry, both eyes   . Allergic    "24/7; 365 days/year; I'm allergic to pollens, dust, all southern grasses/trees, mold, mildue, cats, dogs" (01/07/2017)  . Anal fissure   . Asthma    sees [name]   . Benign prostatic hypertrophy    (sees Dr. Denman George  . CAD (coronary artery disease)    a. s/p CABG 2007. b. Cath 08/2016 - 4/4 patent grafts.  . Carotid bruit    carotid u/s 10/10: 0.39% bilaterally  . Chronic combined systolic and diastolic CHF (congestive heart failure) ([place])   . Complication of anesthesia 1980s   "w/anal cyst OR, he gave me a saddle block then put a narcotic in spinal cord; had a severe reaction to that" (  01/07/2017)  . Congestive heart failure (CHF) ([place])   . ED (erectile dysfunction)   . Family history of adverse reaction to anesthesia    "daughter wakes up during OR" (01/07/2017)  . GERD (gastroesophageal reflux disease)   . Gout   . HTN (hypertension)   . Hx of colonic polyps    (sees Dr. Henrene Pastor)  . Hyperlipidemia   . Hypothyroidism   . Moderate to severe aortic stenosis    a. s/p TAVR 02/2017.  [name] Myocardial infarction ([place]) ~ 2000  . Obesity   . Osteoarthritis    "was in my knees, hands" (01/07/2017 )  . PAF (paroxysmal atrial fibrillation) ([place])    a. documented post TAVR.  [name] Precancerous skin lesion    (sees  Dr. Allyson Sabal)  . Prostate cancer ([place]) dx'd ~ 2014  . 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 aspirin 81 MG tablet Take 1 tablet (81 mg total) by mouth daily. 30 tablet 0  . atorvastatin (LIPITOR) 20 MG tablet TAKE 1 TABLET ONCE DAILY. 90 tablet 0  . Azelastine HCl 0.15 % SOLN Place 2 sprays into the nose in the morning and at bedtime. 30 mL 11  . benzonatate (TESSALON) 200 MG capsule TAKE 1 CAPSULE EVERY 8 HOURS AS NEEDED FOR COUGH. 45 capsule 0  . cetirizine (ZYRTEC) 10 MG tablet Take 10 mg by mouth daily.    . chlorpheniramine (CHLOR-TRIMETON) 4 MG tablet Take 2 tablets (8 mg total) by mouth at bedtime. 60 tablet 12  . diphenhydrAMINE (DIPHENHIST) 25 MG tablet Take 50 mg by mouth at bedtime.    [name] EPINEPHrine 0.3 mg/0.3 mL IJ SOAJ injection Inject 0.3 mLs (0.3 mg total) into the muscle as needed for anaphylaxis. 2 Device 1  . famotidine (PEPCID) 10 MG tablet Take 20 mg by mouth at bedtime.     . fluticasone (FLONASE) 50 MCG/ACT nasal spray Place 2 sprays into both nostrils daily. 16 g 11  . hydrOXYzine (ATARAX/VISTARIL) 10 MG tablet Take 10 mg by mouth at bedtime.    . isosorbide mononitrate (IMDUR) 30 MG 24 hr tablet TAKE 1 TABLET ONCE DAILY. 90 tablet 0  . Ketotifen Fumarate (ZADITOR OP) Apply 1 drop to eye daily as needed (itchy eyes).     [name] levothyroxine (SYNTHROID) 100 MCG tablet Take 1 tablet (100 mcg total) by mouth daily. 90 tablet 3  . methylPREDNISolone (MEDROL DOSEPAK) 4 MG TBPK tablet As directed 21 tablet 0  . metoprolol succinate (TOPROL-XL) 50 MG 24 hr tablet Take 1.5 tablets (75 mg total) by mouth daily. Take with or immediately following a meal. 135 tablet 3  . montelukast (SINGULAIR) 10 MG tablet TAKE 1 TABLET ONCE DAILY IN THE EVENING.  90 tablet 3  . Multiple Vitamins-Minerals (PRESERVISION AREDS PO) Take 2 tablets by mouth every morning.    [name] omeprazole (PRILOSEC) 20 MG capsule TAKE (1) CAPSULE TWICE DAILY. 180 capsule 0  . potassium chloride SA (K-DUR) 20 MEQ tablet Take 2 tablets by mouth twice daily alternating with 2 tablets daily. (Patient taking differently: Take 40 mEq by mouth See admin instructions. Take 40 meq by mouth once a day, alternating with twice daily to coincide with furosemide) 270 tablet 3  . PRADAXA 150 MG CAPS capsule TAKE (1)  CAPSULE TWICE DAILY. 180 capsule 1  . sacubitril-valsartan (ENTRESTO) 49-51 MG Take 1 tablet by mouth 2 (two) times daily. 180 tablet 3  . triamcinolone cream (KENALOG) 0.1 % Apply 1 application topically daily as needed for dry skin.    . valACYclovir (VALTREX) 500 MG tablet Take 500 mg by mouth 2 (two) times daily as needed (cold sores).     No current facility-administered medications on file prior to visit.    Allergies  Allergen Reactions  . Peanut-Containing Drug Products Anaphylaxis  . Sulfonamide Derivatives Anaphylaxis  . Amlodipine Swelling    Swelling in ankles  . Eliquis [Apixaban] Other (See Comments)    Back/hip pain  . Lisinopril Cough    cough  . Xarelto [Rivaroxaban] Other (See Comments)    Back/hip pain    Blood pressure 118/60, pulse 86, height 5\' 6"  (1.676 m), weight 264 lb 6.4 oz (119.9 kg), SpO2 97 %.  NICM (nonischemic cardiomyopathy) ([place]) Blood pressure and HR well controlled and appropriate for further medication titration. Patient reports increased back pain after increasing Entresto to 49-51mg , but will continue taking medication as prescribed. Back pain is not an expected ADR of Entresto and patient is currently undergoing assessment for back pain by PCP. Noted increased fatigue when taking metoprolol succinate 100mg  daily in the past.  Will continue Entresto 49-51mg  BID, furosemide 80mg  daily (no PM dose in over 1 month), metoprolol  succinate 75mg  daily and ADD spironolactone 12.5mg  daily to therapy. Noted patient is on potassium supplement, but baseline potassium only at 4.3.  Plan to repeat BMEt in 2 week and adjust K supplement if needed. I am not expected his BP to tolerate further titration after introducing low dose spironolactone. Will follow up in 3 week at pharmacist clinic for further assesment.     Nissim Fleischer Rodriguez-Guzman PharmD, BCPS, CPP [place] [id number] 12/08/2019 3:14 PM

## 2019-12-10 ENCOUNTER — Telehealth: Payer: Self-pay

## 2019-12-10 NOTE — Telephone Encounter (Signed)
Called with date of next PREP class: 5/3 M/W 230p-345p He is agreeable. May want his wife to join.  Advised to have her contact her MD for a referral to program if that is the case.  Will phone next week reference intake appt.

## 2019-12-13 ENCOUNTER — Ambulatory Visit (INDEPENDENT_AMBULATORY_CARE_PROVIDER_SITE_OTHER): Payer: Medicare Other

## 2019-12-13 DIAGNOSIS — I255 Ischemic cardiomyopathy: Secondary | ICD-10-CM

## 2019-12-13 DIAGNOSIS — J309 Allergic rhinitis, unspecified: Secondary | ICD-10-CM | POA: Diagnosis not present

## 2019-12-13 DIAGNOSIS — Z95810 Presence of automatic (implantable) cardiac defibrillator: Secondary | ICD-10-CM | POA: Diagnosis not present

## 2019-12-14 NOTE — Progress Notes (Signed)
EPIC Encounter for ICM Monitoring  Patient Name: NOVEL GLASSCO is a 86 y.o. male Date: 12/14/2019 Primary Care Physican: Laurey Morale, MD Primary Cardiologist:[place] Electrophysiologist:K[facility] Bi-V Pacing:81% 3/5/2021Weight: 250-252lbs   Spoke with patient and reports feeling well at this time.  Denies fluid symptoms.    Optivol thoracic impedancenormal.  Prescribed: Furosemide80 mgtaketake 1 tablet once a day alternating with one twice a day.  Recommendations: No changes and encouraged to call if experiencing any fluid symptoms.  Follow-up plan: ICM clinic phone appointment on5/24/2021. 91 day device clinic remote transmission 01/10/2020.  Copy of ICM check sent to [facility].  3 month ICM trend: 12/13/2019    1 Year ICM trend:       [name], RN 12/14/2019 12:01 PM

## 2019-12-15 ENCOUNTER — Other Ambulatory Visit: Payer: Self-pay | Admitting: Primary Care

## 2019-12-16 DIAGNOSIS — J3089 Other allergic rhinitis: Secondary | ICD-10-CM

## 2019-12-16 NOTE — Progress Notes (Signed)
VIALS EXP 12-15-20 

## 2019-12-17 ENCOUNTER — Telehealth: Payer: Self-pay

## 2019-12-17 NOTE — Telephone Encounter (Signed)
Call placed to patient to schedule intake appt for PREP Classes scheduled for 4/27 at 9am

## 2019-12-20 DIAGNOSIS — J301 Allergic rhinitis due to pollen: Secondary | ICD-10-CM | POA: Diagnosis not present

## 2019-12-21 ENCOUNTER — Other Ambulatory Visit: Payer: Self-pay

## 2019-12-21 ENCOUNTER — Ambulatory Visit (INDEPENDENT_AMBULATORY_CARE_PROVIDER_SITE_OTHER): Payer: Medicare Other

## 2019-12-21 ENCOUNTER — Ambulatory Visit (INDEPENDENT_AMBULATORY_CARE_PROVIDER_SITE_OTHER): Payer: Medicare Other | Admitting: Pulmonary Disease

## 2019-12-21 ENCOUNTER — Encounter: Payer: Self-pay | Admitting: Pulmonary Disease

## 2019-12-21 DIAGNOSIS — K219 Gastro-esophageal reflux disease without esophagitis: Secondary | ICD-10-CM

## 2019-12-21 DIAGNOSIS — J387 Other diseases of larynx: Secondary | ICD-10-CM

## 2019-12-21 DIAGNOSIS — J4520 Mild intermittent asthma, uncomplicated: Secondary | ICD-10-CM

## 2019-12-21 DIAGNOSIS — R059 Cough, unspecified: Secondary | ICD-10-CM

## 2019-12-21 DIAGNOSIS — I5020 Unspecified systolic (congestive) heart failure: Secondary | ICD-10-CM | POA: Diagnosis not present

## 2019-12-21 DIAGNOSIS — J3089 Other allergic rhinitis: Secondary | ICD-10-CM

## 2019-12-21 DIAGNOSIS — R05 Cough: Secondary | ICD-10-CM

## 2019-12-21 DIAGNOSIS — J309 Allergic rhinitis, unspecified: Secondary | ICD-10-CM | POA: Diagnosis not present

## 2019-12-21 MED ORDER — BENZONATATE 200 MG PO CAPS: ORAL_CAPSULE | ORAL | 3 refills | Status: DC

## 2019-12-21 MED ORDER — PREDNISONE 10 MG PO TABS: ORAL_TABLET | ORAL | 0 refills | Status: DC

## 2019-12-21 NOTE — Patient Instructions (Addendum)
You were seen today by [name], NP  for:   1. Systolic heart failure, unspecified HF chronicity ([place]) 2. Non-seasonal allergic rhinitis due to other allergic trigger 3. Intermittent asthma, unspecified asthma severity, unspecified whether complicated 4. Irritable larynx syndrome 5. Gastroesophageal reflux disease, unspecified whether esophagitis present 6. Cough  - benzonatate (TESSALON) 200 MG capsule; TAKE 1 CAPSULE EVERY 8 HOURS AS NEEDED FOR COUGH.  Dispense: 90 capsule; Refill: 3  Prednisone 10mg  tablet  >>>Take 2 tablets (20 mg total) daily for the next 5 days >>> Take with food in the morning   Continue nasal meds as prescribed  Keep follow-up with allergy asthma  Continue allergy shots  If your cough persists despite these interventions please let our office know C can be scheduled for an in office evaluation as well as likely a chest x-ray  We recommend today:   Meds ordered this encounter  Medications  . predniSONE (DELTASONE) 10 MG tablet    Sig: Take 2 tablets (20mg  total) daily for the next 5 days. Take in the AM with food.    Dispense:  10 tablet    Refill:  0  . benzonatate (TESSALON) 200 MG capsule    Sig: TAKE 1 CAPSULE EVERY 8 HOURS AS NEEDED FOR COUGH.    Dispense:  90 capsule    Refill:  3    Follow Up:    Return in about 4 months (around 04/21/2020), or if symptoms worsen or fail to improve, for Follow up with Dr. Carlis Abbott.   Please do your part to reduce the spread of COVID-19:      Reduce your risk of any infection  and COVID19 by using the similar precautions used for avoiding the common cold or flu:  [name] Wash your hands often with soap and warm water for at least 20 seconds.  If soap and water are not readily available, use an alcohol-based hand sanitizer with at least 60% alcohol.  . If coughing or sneezing, cover your mouth and nose by coughing or sneezing into the elbow areas of your shirt or coat, into a tissue or into your sleeve (not  your hands). Langley Gauss A MASK when in public  . Avoid shaking hands with others and consider head nods or verbal greetings only. . Avoid touching your eyes, nose, or mouth with unwashed hands.  . Avoid close contact with people who are sick. . Avoid places or events with large numbers of people in one location, like concerts or sporting events. . If you have some symptoms but not all symptoms, continue to monitor at home and seek medical attention if your symptoms worsen. . If you are having a medical emergency, call 911.   [place] / e-Visit: [email or web address]         [facility]: [facility]: [id number]                   [facility]: [id number]     It is flu season:   >>> Best ways to protect herself from the flu: Receive the yearly flu vaccine, practice good hand hygiene washing with soap and also using hand sanitizer when available, eat a nutritious meals, get adequate rest, hydrate appropriately   Please contact the office if your symptoms worsen or you have concerns that you are not improving.   Thank you for choosing [facility] for your healthcare, and  for allowing [place] to partner with you on your healthcare journey. I am thankful to be able to provide care to you today.   [name] FNP-C

## 2019-12-21 NOTE — Assessment & Plan Note (Signed)
Dry cough Likely AR flare With history of heart failure need to also keep this in mind Patient denies fluid overload today  Plan: Keep follow-up with primary care and cardiology and continue cardiac medications

## 2019-12-21 NOTE — Progress Notes (Signed)
Virtual Visit via Telephone Note  I connected with Justin [name] on 12/21/19 at 11:00 AM EDT by telephone and verified that I am speaking with the correct person using two identifiers.  Location: Patient: Home Provider: Office [facility] - [id number], Justin [name], [place], Justin [name] [id number]   I discussed the limitations, risks, security and privacy concerns of performing an evaluation and management service by telephone and the availability of in person appointments. I also discussed with the patient that there may be a patient responsible charge related to this service. The patient expressed understanding and agreed to proceed.  Patient consented to consult via telephone: Yes People present and their role in pt care: Pt     History of Present Illness:  86 year old male former smoker followed in our office for mild persistent asthma as well as irritable larynx syndrome.  Patient is also on weekly allergy shots managed by [name].  Dr. [name] follows him for cardiology needs.   PMH: Cough, irritable larynx syndrome, hypothyroidism, testosterone deficiency, BPH, status post CABG x4, status post TAVR, status post ICD placement Smoker/ Smoking History: Former smoker.  Quit 1963.  45.5 pack years. Maintenance: Weekly allergy shots from [name] Pt of: [name]   Chief complaint:    86 year old male former smoker followed in our office for asthma and irritable larynx syndrome.  Since last being seen by me in 2020 patient has establish care with Dr. Carlis Abbott.  Patient establish care with Dr. Carlis Abbott in February/2021.  At that time patient was encouraged to remain on Flonase, Zyrtec and continue allergy shots.  Singulair was added.  He was encouraged to work on increasing his overall physical activity and work on reducing his weight in order to help improve his dyspnea.  He was encouraged to follow-up in 6 months.  Patient contacted our office reporting a worsening cough over  the past 1 to 2 weeks.  He denies any chest congestion.  He reports the cough is a dry hacking cough and pretty constant.  He occasionally coughs during the telephone visit today.  He denies any fevers.  He reports adherence to all of his allergic rhinitis regimen.  He admits that he does have pretty significant increase of nasal drainage.  He continues to use nasal saline rinses.  The nasal drainage is clear.  He was recently treated by primary care about 6 weeks ago for an ear infection.  This was his last course of steroids.  He continues to be followed by allergy asthma with injections.  He has an appointment with them next week.  He does not have any wheezing.  Patient denies worsened reflux.  Last chest x-ray was in September/2020 which was clear.  Patient denies any new medications.  He also denies any fluid retention.  Observations/Objective:  Imaging: 12/2015 CT sinuses without significant disease  Pulmonary function testing: December 2019 spirometry showed a normal ratio, FEV1 2.0 L 78% predicted  Labs  12/2016 IgE > 180 May 2019 eosinophil 400/dL   Social History   Tobacco Use  Smoking Status Former Smoker  . Packs/day: 3.50  . Years: 13.00  . Pack years: 45.50  . Types: Cigarettes  . Quit date: 1963  . Years since quitting: 58.3  Smokeless Tobacco Never Used   Immunization History  Administered Date(s) Administered  . Fluad Quad(high Dose 65+) 05/06/2019  . Hep A / Hep B 04/06/2012, 10/07/2012  . Hepatitis B 05/07/2012  . Influenza Split 06/28/2013  .  Influenza Whole 06/05/2007, 05/13/2008, 06/06/2009, 05/30/2010  . Influenza, High Dose Seasonal PF 05/15/2018  . Influenza,inj,Quad PF,6+ Mos 05/20/2016  . Influenza-Unspecified 05/25/2014, 07/08/2015, 05/06/2017  . PFIZER SARS-COV-2 Vaccination 09/28/2019, 10/12/2019  . Pneumococcal Conjugate-13 09/18/2015  . Pneumococcal Polysaccharide-23 05/13/2008, 05/25/2014  . Td 05/30/2010  . Zoster 10/21/2007  . Zoster  Recombinat (Shingrix) 05/18/2018, 10/16/2018      Assessment and Plan:  Cough AR flare despite adherence to AR regimen   Plan:  Prednisone short course today  Tessalon pearles refilled  If symptoms arent improving contact our office and we will consider an xray   Systolic heart failure ([place]) Dry cough Likely AR flare With history of heart failure need to also keep this in mind Patient denies fluid overload today  Plan: Keep follow-up with primary care and cardiology and continue cardiac medications  Allergic rhinitis Plan: Continue nasal meds Continue nasal saline rinses Continue daily antihistamine Continue Singulair Continue chlor tabs at night Continue weekly allergy injections Prednisone course today Keep follow-up with allergy asthma next week  Asthma, intermittent No changes with medication regimen Continue allergy injections Keep follow-up with allergy  Irritable larynx syndrome Plan: Continue irritable larynx meds/allergy meds as prescribed Continue follow-up with allergy Continue allergy injections Short course of prednisone today  Gastroesophageal reflux disease Plan: Continue omeprazole Continue Pepcid    Follow Up Instructions:  Return in about 4 months (around 04/21/2020), or if symptoms worsen or fail to improve, for Follow up with Dr. Carlis Abbott.   I discussed the assessment and treatment plan with the patient. The patient was provided an opportunity to ask questions and all were answered. The patient agreed with the plan and demonstrated an understanding of the instructions.   The patient was advised to call back or seek an in-person evaluation if the symptoms worsen or if the condition fails to improve as anticipated.  I provided 28 minutes of non-face-to-face time during this encounter.   [name], NP

## 2019-12-21 NOTE — Assessment & Plan Note (Signed)
Plan: Continue omeprazole Continue Pepcid

## 2019-12-21 NOTE — Assessment & Plan Note (Signed)
Plan: Continue irritable larynx meds/allergy meds as prescribed Continue follow-up with allergy Continue allergy injections Short course of prednisone today

## 2019-12-21 NOTE — Assessment & Plan Note (Signed)
Plan: Continue nasal meds Continue nasal saline rinses Continue daily antihistamine Continue Singulair Continue chlor tabs at night Continue weekly allergy injections Prednisone course today Keep follow-up with allergy asthma next week

## 2019-12-21 NOTE — Assessment & Plan Note (Signed)
AR flare despite adherence to AR regimen   Plan:  Prednisone short course today  Tessalon pearles refilled  If symptoms arent improving contact our office and we will consider an xray

## 2019-12-21 NOTE — Assessment & Plan Note (Signed)
No changes with medication regimen Continue allergy injections Keep follow-up with allergy

## 2019-12-21 NOTE — Progress Notes (Signed)
[place] [facility] Report   Patient Details  Name: Justin [name] MRN: [id number] Date of Birth: 04/24/1934 Age: 86 y.o. PCP: Laurey Morale, MD  Vitals:   12/21/19 1136  BP: 112/60  Pulse: 83  Weight: 264 lb 9.6 oz (120 kg)     [facility] Eval - 12/21/19 1100      Referral    Referring Provider  PT    Reason for referral  Obesitity/Overweight;Inactivity    Program Start Date  12/27/19      Measurement   Neck measurement  18 Inches    Waist Circumference  54 inches    Body fat  --   unable to calculate due to age     Information for Trainer   Goals  Exercise 2-3x wk, lifestyle    Current Exercise  none    Orthopedic Concerns  BTKR, hip bursitis     Pertinent Medical History  MI, MVR, CABG, Pacer, ACID, Mac Deg    Current Barriers  none    Restrictions/Precautions  Fall risk    Medications that affect exercise  Beta blocker;Medication causing dizziness/drowsiness      Timed Up and Go (TUGS)   Timed Up and Go  High risk >13 seconds      Mobility and Daily Activities   I find it easy to walk up or down two or more flights of stairs.  1    I have no trouble taking out the trash.  4    I do housework such as vacuuming and dusting on my own without difficulty.  4    I can easily lift a gallon of milk (8lbs).  4    I can easily walk a mile.  2    I have no trouble reaching into high cupboards or reaching down to pick up something from the floor.  1    I do not have trouble doing out-door work such as Armed forces logistics/support/administrative officer, raking leaves, or gardening.  4      Mobility and Daily Activities   I feel younger than my age.  4    I feel independent.  4    I feel energetic.  4    I live an active life.   4    I feel strong.  3    I feel healthy.  4    I feel active as other people my age.  4      How fit and strong are you.   Fit and Strong Total Score  47      Past Medical History:  Diagnosis Date  . Age-related macular degeneration, dry, both eyes   .  Allergic    "24/7; 365 days/year; I'm allergic to pollens, dust, all southern grasses/trees, mold, mildue, cats, dogs" (01/07/2017)  . Anal fissure   . Asthma    sees [name]   . Benign prostatic hypertrophy    (sees Dr. Denman George  . CAD (coronary artery disease)    a. s/p CABG 2007. b. Cath 08/2016 - 4/4 patent grafts.  . Carotid bruit    carotid u/s 10/10: 0.39% bilaterally  . Chronic combined systolic and diastolic CHF (congestive heart failure) ([place])   . Complication of anesthesia 1980s   "w/anal cyst OR, he gave me a saddle block then put a narcotic in spinal cord; had a severe reaction to that" (01/07/2017)  . Congestive heart failure (CHF) ([place])   . ED (erectile dysfunction)   .  Family history of adverse reaction to anesthesia    "daughter wakes up during OR" (01/07/2017)  . GERD (gastroesophageal reflux disease)   . Gout   . HTN (hypertension)   . Hx of colonic polyps    (sees Dr. Henrene Pastor)  . Hyperlipidemia   . Hypothyroidism   . Moderate to severe aortic stenosis    a. s/p TAVR 02/2017.  [name] Myocardial infarction ([place]) ~ 2000  . Obesity   . Osteoarthritis    "was in my knees, hands" (01/07/2017 )  . PAF (paroxysmal atrial fibrillation) ([place])    a. documented post TAVR.  [name] Precancerous skin lesion    (sees Dr. Allyson Sabal)  . Prostate cancer ([place]) dx'd ~ 2014  . 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 ANUS SURGERY     "opened it back up cause it wouldn't heal; wound up w/a fissure" (01/07/2017)  . BIV ICD INSERTION CRT-D N/A 05/02/2017   Procedure: BIV ICD INSERTION CRT-D;  Surgeon: Deboraha Sprang, MD;  Location: [facility];  Service: Cardiovascular;  Laterality: N/A;  . CARDIAC CATHETERIZATION  10/29/2005  . CARDIAC CATHETERIZATION N/A 08/27/2016   Procedure: Right/Left  Heart Cath and Coronary/Graft Angiography;  Surgeon: Burnell Blanks, MD;  Location: [facility];  Service: Cardiovascular;  Laterality: N/A;  . CATARACT EXTRACTION W/ INTRAOCULAR LENS  IMPLANT, BILATERAL Bilateral   . CATARACT EXTRACTION, BILATERAL  2012  . COLONOSCOPY  06/30/2008   no repeats needed   . COLONOSCOPY     had 3 or 4 in the past   . CORONARY ARTERY BYPASS GRAFT  2007   "CABG X4"  . CYST EXCISION PERINEAL  1980s  . HAMMER TOE SURGERY Bilateral   . JOINT REPLACEMENT    . KNEE ARTHROPLASTY  07/30/2011   Procedure: COMPUTER ASSISTED TOTAL KNEE ARTHROPLASTY;  Surgeon: Meredith Pel;  Location: [facility];  Service: Orthopedics;  Laterality: Left;  left total knee arthroplasty  . MASTECTOMY SUBCUTANEOUS Bilateral   . MULTIPLE TOOTH EXTRACTIONS    . ORIF FINGER / THUMB FRACTURE Right ~ 1980   "repair of thumb injury"  . PROSTATE BIOPSY    . REPLACEMENT TOTAL KNEE BILATERAL Bilateral 2012  . TEE WITHOUT CARDIOVERSION N/A 03/04/2017   Procedure: TRANSESOPHAGEAL ECHOCARDIOGRAM (TEE);  Surgeon: Burnell Blanks, MD;  Location: [facility];  Service: Open Heart Surgery;  Laterality: N/A;  . TOTAL KNEE REVISION Right 05/18/2019   Procedure: RIGHT PATELLA REVISION/REMOVAL;  Surgeon: Meredith Pel, MD;  Location: [facility];  Service: Orthopedics;  Laterality: Right;  . TRANSCATHETER AORTIC VALVE REPLACEMENT, TRANSFEMORAL N/A 03/04/2017   Procedure: TRANSCATHETER AORTIC VALVE REPLACEMENT, TRANSFEMORAL;  Surgeon: Burnell Blanks, MD;  Location: [facility];  Service: Open Heart Surgery;  Laterality: N/A;   Social History   Tobacco Use  Smoking Status Former Smoker  . Packs/day: 3.50  . Years: 13.00  . Pack years: 45.50  . Types: Cigarettes  . Quit date: 1963  . Years since quitting: 58.3  Smokeless Tobacco Never Used    PREP will start 12/27/19 x 12 wks MW 230p-345p     Barnett Hatter 12/21/2019, 11:40 AM

## 2019-12-22 ENCOUNTER — Other Ambulatory Visit: Payer: Self-pay

## 2019-12-22 ENCOUNTER — Other Ambulatory Visit: Payer: Medicare Other | Admitting: *Deleted

## 2019-12-22 DIAGNOSIS — I480 Paroxysmal atrial fibrillation: Secondary | ICD-10-CM | POA: Diagnosis not present

## 2019-12-23 LAB — BASIC METABOLIC PANEL
BUN/Creatinine Ratio: 19 (ref 10–24)
BUN: 17 mg/dL (ref 8–27)
CO2: 23 mmol/L (ref 20–29)
Calcium: 8.8 mg/dL (ref 8.6–10.2)
Chloride: 102 mmol/L (ref 96–106)
Creatinine, Ser: 0.89 mg/dL (ref 0.76–1.27)
GFR calc Af Amer: 91 mL/min/{1.73_m2} (ref >59)
GFR calc non Af Amer: 79 mL/min/{1.73_m2} (ref >59)
Glucose: 164 mg/dL — ABNORMAL HIGH (ref 65–99)
Potassium: 4.5 mmol/L (ref 3.5–5.2)
Sodium: 138 mmol/L (ref 134–144)

## 2019-12-27 NOTE — Progress Notes (Signed)
[facility] PREP Weekly Session   Patient Details  Name: Justin [name] MRN: [id number] Date of Birth: 03/26/1934 Age: 86 y.o. PCP: Laurey Morale, MD  There were no vitals filed for this visit.  [facility] Weekly seesion - 12/27/19 1600      Weekly Session   Topic Discussed  Goal setting and welcome to the program    Classes attended to date  1       Handouts: scale of perceived exertion and weight training log given Barnett Hatter 12/27/2019, 4:22 PM

## 2019-12-28 ENCOUNTER — Ambulatory Visit (INDEPENDENT_AMBULATORY_CARE_PROVIDER_SITE_OTHER): Payer: Medicare Other

## 2019-12-28 ENCOUNTER — Ambulatory Visit (INDEPENDENT_AMBULATORY_CARE_PROVIDER_SITE_OTHER): Payer: Medicare Other | Admitting: Pharmacist

## 2019-12-28 ENCOUNTER — Other Ambulatory Visit: Payer: Self-pay

## 2019-12-28 VITALS — BP 110/60 | HR 89

## 2019-12-28 DIAGNOSIS — J309 Allergic rhinitis, unspecified: Secondary | ICD-10-CM

## 2019-12-28 DIAGNOSIS — I5020 Unspecified systolic (congestive) heart failure: Secondary | ICD-10-CM

## 2019-12-28 MED ORDER — SPIRONOLACTONE 25 MG PO TABS: 25.0000 mg | ORAL_TABLET | Freq: Every day | ORAL | 3 refills | Status: DC

## 2019-12-28 NOTE — Patient Instructions (Addendum)
It was a pleasure to meet you!  Please increase your spironolactone to 25mg  (1 whole tablet) daily. Continue all other medications as is  Call us at [phone] with any questions or concerns.

## 2019-12-28 NOTE — Progress Notes (Signed)
Patient ID: ZADAN ENSZ                 DOB: 27-Mar-1934                      MRN: [id number]     [id number] W Blaisdell is a 86 y.o. male referred by  Oda Kilts, PA-C to HTN clinic. PMH is significant for moderate to severe aortic stenosis s/p TAVR LBBB/IVCD (2018), MI (2000), PAF, CAD s/p CABG x4 (2007), obesity, HTN, HLD, and CHF (ECHO 04/2019 EF 35-40%).   At last visit with PharmD clinic patient reported he had increased back pain with increased Entresto dose, however he stated he would continue to take and was undergoing workup for back pain by PCP. Due to increased fatigure with metoprolol 100mg  daily, metoprolol dose was not up-titrated. Spironolactone 12.5mg  daily was started. Repeat BMP was stable with a scr of 0.89 and K of 4.5.  Patient presents today to PharmD clinic is good spirits. He denies dizziness, lightheadedness, headache, blurred vision, SOB or swelling. Weight remains stable. He states he has good energy. His back pain is much better. He is starting a 12 week exercise program at the [facility] on Mon and Wed and also hopes to start swimming on Fridays.  He stop driving last Thanksgiving due to macular degeneration.    Current HTN meds:  Imdur 30mg  daily Furosemide 80mg  daily Metoprolol succinate 75mg  daily Entresto 49-51mg  BID (increased 11/29/2019) Spironolactone 12.5mg  daily (added 12/08/19)  Previously tried: metoprolol 100mg  daily (fatigue)  BP goal: 130/80  Family History: CAD (male 1st degree relative <50y)  Social History: former smoker (quit 1963, smoked 3.5 ppd), occasional alcohol use, denies drug use  Diet: patient has attended [employer] in [place], [place] seven times - endorses very limited salt intake, snacks at 10a and 3p  Exercise: walks regularly  Home BP readings: 123456 systolic  Wt Readings from Last 3 Encounters:  12/21/19 264 lb 9.6 oz (120 kg)  12/08/19 264 lb 6.4 oz (119.9 kg)  11/29/19 263 lb  (119.3 kg)   BP Readings from Last 3 Encounters:  12/21/19 112/60  12/08/19 118/60  11/29/19 134/62   Pulse Readings from Last 3 Encounters:  12/21/19 83  12/08/19 86  11/29/19 80    Renal function: Estimated Creatinine Clearance: 76.8 mL/min (by C-G formula based on SCr of 0.89 mg/dL).  Past Medical History:  Diagnosis Date  . Age-related macular degeneration, dry, both eyes   . Allergic    "24/7; 365 days/year; I'm allergic to pollens, dust, all southern grasses/trees, mold, mildue, cats, dogs" (01/07/2017)  . Anal fissure   . Asthma    sees [name]   . Benign prostatic hypertrophy    (sees Dr. Denman George  . CAD (coronary artery disease)    a. s/p CABG 2007. b. Cath 08/2016 - 4/4 patent grafts.  . Carotid bruit    carotid u/s 10/10: 0.39% bilaterally  . Chronic combined systolic and diastolic CHF (congestive heart failure) ([place])   . Complication of anesthesia 1980s   "w/anal cyst OR, he gave me a saddle block then put a narcotic in spinal cord; had a severe reaction to that" (01/07/2017)  . Congestive heart failure (CHF) ([place])   . ED (erectile dysfunction)   . Family history of adverse reaction to anesthesia    "daughter wakes up during OR" (01/07/2017)  . GERD (gastroesophageal reflux disease)   . Gout   .  HTN (hypertension)   . Hx of colonic polyps    (sees Dr. Henrene Pastor)  . Hyperlipidemia   . Hypothyroidism   . Moderate to severe aortic stenosis    a. s/p TAVR 02/2017.  [name] Myocardial infarction ([place]) ~ 2000  . Obesity   . Osteoarthritis    "was in my knees, hands" (01/07/2017 )  . PAF (paroxysmal atrial fibrillation) ([place])    a. documented post TAVR.  [name] Precancerous skin lesion    (sees Dr. Allyson Sabal)  . Prostate cancer ([place]) dx'd ~ 2014  . 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 aspirin 81 MG tablet Take 1 tablet (81 mg total) by mouth daily. 30 tablet 0  . atorvastatin (LIPITOR) 20 MG tablet TAKE 1 TABLET ONCE DAILY. 90 tablet 0  . Azelastine HCl 0.15 % SOLN Place 2 sprays into the nose in the morning and at bedtime. 30 mL 11  . benzonatate (TESSALON) 200 MG capsule TAKE 1 CAPSULE EVERY 8 HOURS AS NEEDED FOR COUGH. 90 capsule 3  . cetirizine (ZYRTEC) 10 MG tablet Take 10 mg by mouth daily.    . chlorpheniramine (CHLOR-TRIMETON) 4 MG tablet Take 2 tablets (8 mg total) by mouth at bedtime. 60 tablet 12  . diphenhydrAMINE (DIPHENHIST) 25 MG tablet Take 50 mg by mouth at bedtime.    [name] EPINEPHrine 0.3 mg/0.3 mL IJ SOAJ injection Inject 0.3 mLs (0.3 mg total) into the muscle as needed for anaphylaxis. 2 Device 1  . famotidine (PEPCID) 10 MG tablet Take 20 mg by mouth at bedtime.     . fluticasone (FLONASE) 50 MCG/ACT nasal spray Place 2 sprays into both nostrils daily. 16 g 11  . furosemide (LASIX) 80 MG tablet Take 1 tablet (80 mg total) by mouth daily. 90 tablet 1  . hydrOXYzine (ATARAX/VISTARIL) 10 MG tablet Take 10 mg by mouth at bedtime.    . isosorbide mononitrate (IMDUR) 30 MG 24 hr tablet TAKE 1 TABLET ONCE DAILY. 90 tablet 0  . Ketotifen Fumarate (ZADITOR OP) Apply 1 drop to eye daily as needed (itchy eyes).     [name] levothyroxine (SYNTHROID) 100 MCG tablet Take 1 tablet (100 mcg total) by mouth daily. 90 tablet 3  . methylPREDNISolone (MEDROL DOSEPAK) 4 MG TBPK tablet As directed 21 tablet 0  . metoprolol succinate (TOPROL-XL) 50 MG 24 hr tablet Take 1.5 tablets (75 mg total) by mouth daily. Take with or immediately following a meal. 135 tablet 3  . montelukast (SINGULAIR) 10 MG tablet TAKE 1 TABLET ONCE DAILY IN THE EVENING. 90 tablet 3  . Multiple Vitamins-Minerals (PRESERVISION AREDS PO) Take 2 tablets by mouth every morning.    [name] omeprazole (PRILOSEC) 20 MG capsule TAKE (1) CAPSULE TWICE DAILY. 180 capsule 0    . potassium chloride SA (K-DUR) 20 MEQ tablet Take 2 tablets by mouth twice daily alternating with 2 tablets daily. (Patient taking differently: Take 40 mEq by mouth See admin instructions. Take 40 meq by mouth once a day, alternating with twice daily to coincide with furosemide) 270 tablet 3  . PRADAXA 150 MG CAPS capsule TAKE (1) CAPSULE TWICE DAILY. 180 capsule 1  . predniSONE (DELTASONE) 10 MG tablet Take 2 tablets (20mg  total) daily for the next 5 days. Take in  the AM with food. 10 tablet 0  . sacubitril-valsartan (ENTRESTO) 49-51 MG Take 1 tablet by mouth 2 (two) times daily. 180 tablet 3  . spironolactone (ALDACTONE) 25 MG tablet Take 0.5 tablets (12.5 mg total) by mouth daily. 45 tablet 1  . triamcinolone cream (KENALOG) 0.1 % Apply 1 application topically daily as needed for dry skin.    . valACYclovir (VALTREX) 500 MG tablet Take 500 mg by mouth 2 (two) times daily as needed (cold sores).     No current facility-administered medications on file prior to visit.    Allergies  Allergen Reactions  . Peanut-Containing Drug Products Anaphylaxis  . Sulfonamide Derivatives Anaphylaxis  . Amlodipine Swelling    Swelling in ankles  . Eliquis [Apixaban] Other (See Comments)    Back/hip pain  . Lisinopril Cough    cough  . Xarelto [Rivaroxaban] Other (See Comments)    Back/hip pain    There were no vitals taken for this visit.  No problem-specific Assessment & Plan notes found for this encounter. Assessment and Plan:  CHF medication titration: Patient states his blood pressure at home is usually about 123456 systolic. A little lower here, but I still feel comfortable increasing spironolactone to 25mg  daily. Patient educated on the s/sx of low BP. Will continue K supplements for now as K only increased for 4.3 to 4.5 on initiation of spironolactone. Will consider stopped after next lab repeat in 2 weeks. Follow up in clinic for BP check and BMP in 2 weeks. No room to titrate Entresto  and patient fatigue persisted for 1 month on metoprolol 100mg , therefore will not increase.   Ramond Dial, Pharm.D, BCPS, CPP [place]  [id number], [place], [place] [address]  Phone: [phone]; Fax: [phone]   12/28/2019 3:04 PM

## 2019-12-29 ENCOUNTER — Ambulatory Visit: Payer: Medicare Other

## 2020-01-03 NOTE — Progress Notes (Signed)
[facility] PREP Weekly Session   Patient Details  Name: Justin [name] MRN: [id number] Date of Birth: 1934/06/11 Age: 86 y.o. PCP: Laurey Morale, MD  Vitals:   01/03/20 1627  Weight: 260 lb (117.9 kg)    [facility] Weekly seesion - 01/03/20 1600      Weekly Session   Topic Discussed  Importance of resistance training;Other ways to be active   intial talk about sodium   Minutes exercised this week  160 minutes    Classes attended to date  3      Grateful for: mobility Nutrition celebration: no salt cooking, Mother Day lunch Barriers/struggles: Rande Brunt 01/03/2020, 4:28 PM

## 2020-01-04 ENCOUNTER — Ambulatory Visit (INDEPENDENT_AMBULATORY_CARE_PROVIDER_SITE_OTHER): Payer: Medicare Other

## 2020-01-04 DIAGNOSIS — Z961 Presence of intraocular lens: Secondary | ICD-10-CM | POA: Diagnosis not present

## 2020-01-04 DIAGNOSIS — H548 Legal blindness, as defined in USA: Secondary | ICD-10-CM | POA: Diagnosis not present

## 2020-01-04 DIAGNOSIS — J309 Allergic rhinitis, unspecified: Secondary | ICD-10-CM | POA: Diagnosis not present

## 2020-01-05 DIAGNOSIS — H353221 Exudative age-related macular degeneration, left eye, with active choroidal neovascularization: Secondary | ICD-10-CM | POA: Diagnosis not present

## 2020-01-06 ENCOUNTER — Telehealth: Payer: Self-pay | Admitting: Family Medicine

## 2020-01-06 ENCOUNTER — Other Ambulatory Visit: Payer: Self-pay

## 2020-01-06 ENCOUNTER — Ambulatory Visit: Payer: Medicare Other | Admitting: Allergy

## 2020-01-06 ENCOUNTER — Encounter: Payer: Self-pay | Admitting: Allergy

## 2020-01-06 VITALS — BP 98/58 | HR 71 | Temp 97.9°F | Resp 18 | Ht 66.0 in | Wt 267.2 lb

## 2020-01-06 DIAGNOSIS — J3089 Other allergic rhinitis: Secondary | ICD-10-CM

## 2020-01-06 NOTE — Progress Notes (Signed)
Follow-up Note  RE: Justin [name] MRN: [id number] DOB: 11/08/1933 Date of Office Visit: 01/06/2020   History of present illness: Justin [name] is a 86 y.o. male presenting today for follow-up of allergic rhinitis.  He was last seen in the office on Dec 31, 2018 by myself.  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  He has received 2 doses of the Pfizer vaccine.  He has not had Covid illness.  He overall states he has been doing rather well. In regards to his allergic rhinitis he is on immunotherapy at maintenance now.  He states his allergy symptoms have been stabilized.  He does take his allergy medications in the morning and at night including Zyrtec and Singulair.  The Dymista was expensive thus he has separate Flonase and Astelin that he uses as well.  He states the only issue he has is occasional sneezing fits where he may sneeze 6-9 times during the day.  Sneezing fits mostly occur at home.  He states he has not had any chest congestion this year which is unusual as he typically will develop.  He has been tolerating immunotherapy well without any large local or systemic reactions.  Review of systems: Review of Systems  Constitutional: Negative.   HENT: Negative.   Eyes:       See HPI  Respiratory: Negative.   Cardiovascular: Negative.   Gastrointestinal: Negative.   Musculoskeletal: Negative.   Skin: Negative.   Neurological: Negative.     All other systems negative unless noted above in HPI  Past medical/social/surgical/family history have been reviewed and are unchanged unless specifically indicated below.  No changes  Medication List: Current Outpatient Medications  Medication Sig Dispense Refill  . aspirin 81 MG tablet Take 1 tablet (81 mg total) by mouth daily. 30 tablet 0  . atorvastatin (LIPITOR) 20 MG tablet TAKE 1 TABLET ONCE DAILY. 90  tablet 0  . Azelastine HCl 0.15 % SOLN Place 2 sprays into the nose in the morning and at bedtime. 30 mL 11  . benzonatate (TESSALON) 200 MG capsule TAKE 1 CAPSULE EVERY 8 HOURS AS NEEDED FOR COUGH. 90 capsule 3  . cetirizine (ZYRTEC) 10 MG tablet Take 10 mg by mouth daily.    . chlorpheniramine (CHLOR-TRIMETON) 4 MG tablet Take 2 tablets (8 mg total) by mouth at bedtime. 60 tablet 12  . diphenhydrAMINE (DIPHENHIST) 25 MG tablet Take 50 mg by mouth at bedtime.    [name] EPINEPHrine 0.3 mg/0.3 mL IJ SOAJ injection Inject 0.3 mLs (0.3 mg total) into the muscle as needed for anaphylaxis. 2 Device 1  . famotidine (PEPCID) 10 MG tablet Take 20 mg by mouth at bedtime.     . fluticasone (FLONASE) 50 MCG/ACT nasal spray Place 2 sprays into both nostrils daily. 16 g 11  . furosemide (LASIX) 80 MG tablet Take 1 tablet (80 mg total) by mouth daily. 90 tablet 1  . hydrOXYzine (ATARAX/VISTARIL) 10 MG tablet Take 10 mg by mouth at bedtime.    . isosorbide mononitrate (IMDUR) 30 MG 24 hr tablet TAKE 1 TABLET ONCE DAILY. 90 tablet 0  . Ketotifen Fumarate (ZADITOR OP) Apply 1 drop to eye daily as needed (itchy eyes).     [name] levothyroxine (SYNTHROID) 100 MCG tablet Take 1 tablet (100 mcg total) by mouth daily. 90 tablet 3  . metoprolol succinate (  TOPROL-XL) 50 MG 24 hr tablet Take 1.5 tablets (75 mg total) by mouth daily. Take with or immediately following a meal. 135 tablet 3  . montelukast (SINGULAIR) 10 MG tablet TAKE 1 TABLET ONCE DAILY IN THE EVENING. 90 tablet 3  . Multiple Vitamins-Minerals (PRESERVISION AREDS PO) Take 2 tablets by mouth every morning.    [name] omeprazole (PRILOSEC) 20 MG capsule TAKE (1) CAPSULE TWICE DAILY. 180 capsule 0  . potassium chloride SA (K-DUR) 20 MEQ tablet Take 2 tablets by mouth twice daily alternating with 2 tablets daily. (Patient taking differently: Take 40 mEq by mouth See admin instructions. Take 40 meq by mouth once a day, alternating with twice daily to coincide with furosemide)  270 tablet 3  . PRADAXA 150 MG CAPS capsule TAKE (1) CAPSULE TWICE DAILY. 180 capsule 1  . sacubitril-valsartan (ENTRESTO) 49-51 MG Take 1 tablet by mouth 2 (two) times daily. 180 tablet 3  . spironolactone (ALDACTONE) 25 MG tablet Take 1 tablet (25 mg total) by mouth daily. 90 tablet 3  . triamcinolone cream (KENALOG) 0.1 % Apply 1 application topically daily as needed for dry skin.    . valACYclovir (VALTREX) 500 MG tablet Take 500 mg by mouth 2 (two) times daily as needed (cold sores).     No current facility-administered medications for this visit.     Known medication allergies: Allergies  Allergen Reactions  . Peanut-Containing Drug Products Anaphylaxis  . Sulfonamide Derivatives Anaphylaxis  . Amlodipine Swelling    Swelling in ankles  . Eliquis [Apixaban] Other (See Comments)    Back/hip pain  . Lisinopril Cough    cough  . Xarelto [Rivaroxaban] Other (See Comments)    Back/hip pain     Physical examination: Blood pressure (!) 98/58, pulse 71, temperature 97.9 F (36.6 C), temperature source Temporal, resp. rate 18, height 5\' 6"  (1.676 m), weight 267 lb 3.2 oz (121.2 kg), SpO2 97 %.  General: Alert, interactive, in no acute distress. HEENT: TMs pearly gray, turbinates minimally edematous without discharge, post-pharynx non erythematous. Neck: Supple without lymphadenopathy. Lungs: Clear to auscultation without wheezing, rhonchi or rales. {no increased work of breathing. CV: Normal S1, S2 without murmurs. Abdomen: Nondistended, nontender. Skin: Warm and dry, without lesions or rashes. Extremities:  No clubbing, cyanosis or edema. Neuro:   Grossly intact.  Diagnositics/Labs: None today  Assessment and plan:   Allergic rhinitis  -  continue avoidance measures for weed pollen, tree pollen, mold, dust mites and cockroach  - at this time continue your current routine with zyrtec, singulair, flonase and astelin, and saline nasal spray  - continue allergen  immunotherapy per schedule.  Have your epinephrine device with you on days of your injections.    Follow-up 12 months or sooner if needed    No follow-ups on file.  I appreciate the opportunity to take part in Jivan's care. Please do not hesitate to contact me with questions.  Sincerely,   Prudy Feeler, MD Allergy/Immunology [facility] [place] [facility]

## 2020-01-06 NOTE — Chronic Care Management (AMB) (Signed)
  Chronic Care Management   Note  01/06/2020 Name: Justin [name] MRN: [id number] DOB: 07-21-1934  Justin [name] is a 86 y.o. year old male who is a primary care patient of Laurey Morale, MD. I reached out to Justin [name] by phone today in response to a referral sent by Justin [name]'s PCP, Laurey Morale, MD.   Justin [name] was given information about Chronic Care Management services today including:  1. CCM service includes personalized support from designated clinical staff supervised by his physician, including individualized plan of care and coordination with other care providers 2. 24/7 contact phone numbers for assistance for urgent and routine care needs. 3. Service will only be billed when office clinical staff spend 20 minutes or more in a month to coordinate care. 4. Only one practitioner may furnish and bill the service in a calendar month. 5. The patient may stop CCM services at any time (effective at the end of the month) by phone call to the office staff.   Patient agreed to services and verbal consent obtained.   Follow up plan:   [place]

## 2020-01-06 NOTE — Chronic Care Management (AMB) (Signed)
  Chronic Care Management   Outreach Note  01/06/2020 Name: Justin [name] MRN: [id number] DOB: 08-Jan-1934  Referred by: Laurey Morale, MD Reason for referral : Chronic Care Management   An unsuccessful telephone outreach was attempted today. The patient was referred to the pharmacist for assistance with care management and care coordination.   Follow Up Plan:   [place]

## 2020-01-06 NOTE — Patient Instructions (Addendum)
Allergic rhinitis  -  continue avoidance measures for weed pollen, tree pollen, mold, dust mites and cockroach  - at this time continue your current routine with zyrtec, singulair, flonase and astelin, and saline nasal spray  - continue allergen immunotherapy per schedule.  Have your epinephrine device with you on days of your injections.    Follow-up 12 months or sooner if needed

## 2020-01-10 ENCOUNTER — Ambulatory Visit: Payer: Medicare Other | Admitting: Orthopedic Surgery

## 2020-01-10 ENCOUNTER — Ambulatory Visit: Payer: Self-pay

## 2020-01-10 ENCOUNTER — Ambulatory Visit (INDEPENDENT_AMBULATORY_CARE_PROVIDER_SITE_OTHER): Payer: Medicare Other | Admitting: *Deleted

## 2020-01-10 ENCOUNTER — Other Ambulatory Visit: Payer: Self-pay

## 2020-01-10 DIAGNOSIS — I428 Other cardiomyopathies: Secondary | ICD-10-CM | POA: Diagnosis not present

## 2020-01-10 DIAGNOSIS — M25561 Pain in right knee: Secondary | ICD-10-CM

## 2020-01-10 LAB — CUP PACEART REMOTE DEVICE CHECK
Battery Remaining Longevity: 29 mo
Battery Voltage: 2.95 V
Brady Statistic AP VP Percent: 2.57 %
Brady Statistic AP VS Percent: 0.21 %
Brady Statistic AS VP Percent: 91.21 %
Brady Statistic AS VS Percent: 6.02 %
Brady Statistic RA Percent Paced: 2.71 %
Brady Statistic RV Percent Paced: 87.33 %
Date Time Interrogation Session: 20210516082506
HighPow Impedance: 81 Ohm
Implantable Lead Implant Date: 20180907
Implantable Lead Implant Date: 20180907
Implantable Lead Implant Date: 20180907
Implantable Lead Location: 753858
Implantable Lead Location: 753859
Implantable Lead Location: 753860
Implantable Lead Model: 4396
Implantable Lead Model: 5076
Implantable Pulse Generator Implant Date: 20180907
Lead Channel Impedance Value: 380 Ohm
Lead Channel Impedance Value: 380 Ohm
Lead Channel Impedance Value: 456 Ohm
Lead Channel Impedance Value: 513 Ohm
Lead Channel Impedance Value: 589 Ohm
Lead Channel Impedance Value: 893 Ohm
Lead Channel Pacing Threshold Amplitude: 0.625 V
Lead Channel Pacing Threshold Amplitude: 0.75 V
Lead Channel Pacing Threshold Amplitude: 1.125 V
Lead Channel Pacing Threshold Pulse Width: 0.4 ms
Lead Channel Pacing Threshold Pulse Width: 0.4 ms
Lead Channel Pacing Threshold Pulse Width: 1 ms
Lead Channel Sensing Intrinsic Amplitude: 11.125 mV
Lead Channel Sensing Intrinsic Amplitude: 11.125 mV
Lead Channel Sensing Intrinsic Amplitude: 2 mV
Lead Channel Sensing Intrinsic Amplitude: 2 mV
Lead Channel Setting Pacing Amplitude: 1.5 V
Lead Channel Setting Pacing Amplitude: 2 V
Lead Channel Setting Pacing Amplitude: 2.5 V
Lead Channel Setting Pacing Pulse Width: 0.4 ms
Lead Channel Setting Pacing Pulse Width: 1 ms
Lead Channel Setting Sensing Sensitivity: 0.3 mV

## 2020-01-10 NOTE — Progress Notes (Signed)
[facility] PREP Weekly Session   Patient Details  Name: Justin [name] MRN: [id number] Date of Birth: June 18, 1934 Age: 86 y.o. PCP: Laurey Morale, MD  Vitals:   01/10/20 1626  Weight: 261 lb (118.4 kg)    [facility] Weekly seesion - 01/10/20 1600      Weekly Session   Topic Discussed  Health habits    Minutes exercised this week  400 minutes    Classes attended to date  5      Fun things since last meeting: writing Grateful for: happy life Nutrition celebration: no salt soup Barriers/struggles: snacks   Barnett Hatter 01/10/2020, 4:27 PM

## 2020-01-11 ENCOUNTER — Ambulatory Visit (INDEPENDENT_AMBULATORY_CARE_PROVIDER_SITE_OTHER): Payer: Medicare Other | Admitting: Pharmacist

## 2020-01-11 ENCOUNTER — Ambulatory Visit (INDEPENDENT_AMBULATORY_CARE_PROVIDER_SITE_OTHER): Payer: Medicare Other

## 2020-01-11 VITALS — BP 116/50 | HR 81

## 2020-01-11 DIAGNOSIS — I5033 Acute on chronic diastolic (congestive) heart failure: Secondary | ICD-10-CM

## 2020-01-11 DIAGNOSIS — I5020 Unspecified systolic (congestive) heart failure: Secondary | ICD-10-CM

## 2020-01-11 DIAGNOSIS — J3089 Other allergic rhinitis: Secondary | ICD-10-CM | POA: Diagnosis not present

## 2020-01-11 NOTE — Progress Notes (Signed)
Remote ICD transmission.   

## 2020-01-11 NOTE — Patient Instructions (Addendum)
It was nice to see you again.  Please continue taking:   Imdur 30mg  daily Furosemide 80mg  daily Metoprolol succinate 75mg  daily Entresto 49-51mg  BID  Spironolactone 25mg  daily   Call us at [phone] with any questions or concerns

## 2020-01-11 NOTE — Progress Notes (Signed)
Patient ID: ARGUS VANDECAR                 DOB: 1934-04-12                      MRN: [id number]     [id number] Justin [name] is a 86 y.o. male referred by  Oda Kilts, PA-C to HTN clinic. PMH is significant for moderate to severe aortic stenosis s/p TAVR LBBB/IVCD (2018), MI (2000), PAF, CAD s/p CABG x4 (2007), obesity, HTN, HLD, and CHF (ECHO 04/2019 EF 35-40%).   At last visit with PharmD clinic spironolactone was increased to 25mg  daily. Due to increased fatigure with metoprolol 100mg  daily, metoprolol dose was not up-titrated.   Patient presents today for follow up. He denies dizziness, lightheadedness, headache, blurred vision, SOB or swelling. States his energy is good. His back pain is almost gone. Doing an exercise program through [facility]. Is not checking BP at home, but does check weight which has been stable. Of note when he saw his allergist his BP was 98/58.  Current HTN meds:  Imdur 30mg  daily Furosemide 80mg  daily Metoprolol succinate 75mg  daily Entresto 49-51mg  BID (increased 11/29/2019) Spironolactone 25mg  daily (increased 12/28/19)  Previously tried: metoprolol 100mg  daily (fatigue)  BP goal: <130/80  Family History: CAD (male 1st degree relative <50y)  Social History: former smoker (quit 1963, smoked 3.5 ppd), occasional alcohol use, denies drug use  Diet: patient has attended [employer] in [place], [place] seven times - endorses very limited salt intake, snacks at 10a and 3p  Exercise: walks regularly  Home BP readings: not been checking  Wt Readings from Last 3 Encounters:  01/10/20 261 lb (118.4 kg)  01/06/20 267 lb 3.2 oz (121.2 kg)  01/03/20 260 lb (117.9 kg)   BP Readings from Last 3 Encounters:  01/06/20 (!) 98/58  12/28/19 110/60  12/21/19 112/60   Pulse Readings from Last 3 Encounters:  01/06/20 71  12/28/19 89  12/21/19 83    Renal function: Estimated Creatinine Clearance: 76.1 mL/min (by C-G formula based on  SCr of 0.89 mg/dL).  Past Medical History:  Diagnosis Date  . Age-related macular degeneration, dry, both eyes   . Allergic    "24/7; 365 days/year; I'm allergic to pollens, dust, all southern grasses/trees, mold, mildue, cats, dogs" (01/07/2017)  . Anal fissure   . Asthma    sees [name]   . Benign prostatic hypertrophy    (sees Dr. Denman George  . CAD (coronary artery disease)    a. s/p CABG 2007. b. Cath 08/2016 - 4/4 patent grafts.  . Carotid bruit    carotid u/s 10/10: 0.39% bilaterally  . Chronic combined systolic and diastolic CHF (congestive heart failure) ([place])   . Complication of anesthesia 1980s   "Justin/anal cyst OR, he gave me a saddle block then put a narcotic in spinal cord; had a severe reaction to that" (01/07/2017)  . Congestive heart failure (CHF) ([place])   . ED (erectile dysfunction)   . Family history of adverse reaction to anesthesia    "daughter wakes up during OR" (01/07/2017)  . GERD (gastroesophageal reflux disease)   . Gout   . HTN (hypertension)   . Hx of colonic polyps    (sees Dr. Henrene Pastor)  . Hyperlipidemia   . Hypothyroidism   . Moderate to severe aortic stenosis    a. s/p TAVR 02/2017.  [name] Myocardial infarction ([place]) ~ 2000  . Obesity   .  Osteoarthritis    "was in my knees, hands" (01/07/2017 )  . PAF (paroxysmal atrial fibrillation) ([place])    a. documented post TAVR.  [name] Precancerous skin lesion    (sees Dr. Allyson Sabal)  . Prostate cancer ([place]) dx'd ~ 2014  . 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 aspirin 81 MG tablet Take 1 tablet (81 mg total) by mouth daily. 30 tablet 0  . atorvastatin (LIPITOR) 20 MG tablet TAKE 1 TABLET ONCE DAILY. 90 tablet 0  . Azelastine HCl 0.15 % SOLN Place 2 sprays into the nose in the  morning and at bedtime. 30 mL 11  . benzonatate (TESSALON) 200 MG capsule TAKE 1 CAPSULE EVERY 8 HOURS AS NEEDED FOR COUGH. 90 capsule 3  . cetirizine (ZYRTEC) 10 MG tablet Take 10 mg by mouth daily.    . chlorpheniramine (CHLOR-TRIMETON) 4 MG tablet Take 2 tablets (8 mg total) by mouth at bedtime. 60 tablet 12  . diphenhydrAMINE (DIPHENHIST) 25 MG tablet Take 50 mg by mouth at bedtime.    [name] EPINEPHrine 0.3 mg/0.3 mL IJ SOAJ injection Inject 0.3 mLs (0.3 mg total) into the muscle as needed for anaphylaxis. 2 Device 1  . famotidine (PEPCID) 10 MG tablet Take 20 mg by mouth at bedtime.     . fluticasone (FLONASE) 50 MCG/ACT nasal spray Place 2 sprays into both nostrils daily. 16 g 11  . furosemide (LASIX) 80 MG tablet Take 1 tablet (80 mg total) by mouth daily. 90 tablet 1  . hydrOXYzine (ATARAX/VISTARIL) 10 MG tablet Take 10 mg by mouth at bedtime.    . isosorbide mononitrate (IMDUR) 30 MG 24 hr tablet TAKE 1 TABLET ONCE DAILY. 90 tablet 0  . Ketotifen Fumarate (ZADITOR OP) Apply 1 drop to eye daily as needed (itchy eyes).     [name] levothyroxine (SYNTHROID) 100 MCG tablet Take 1 tablet (100 mcg total) by mouth daily. 90 tablet 3  . metoprolol succinate (TOPROL-XL) 50 MG 24 hr tablet Take 1.5 tablets (75 mg total) by mouth daily. Take with or immediately following a meal. 135 tablet 3  . montelukast (SINGULAIR) 10 MG tablet TAKE 1 TABLET ONCE DAILY IN THE EVENING. 90 tablet 3  . Multiple Vitamins-Minerals (PRESERVISION AREDS PO) Take 2 tablets by mouth every morning.    [name] omeprazole (PRILOSEC) 20 MG capsule TAKE (1) CAPSULE TWICE DAILY. 180 capsule 0  . potassium chloride SA (K-DUR) 20 MEQ tablet Take 2 tablets by mouth twice daily alternating with 2 tablets daily. (Patient taking differently: Take 40 mEq by mouth See admin instructions. Take 40 meq by mouth once a day, alternating with twice daily to coincide with furosemide) 270 tablet 3  . PRADAXA 150 MG CAPS capsule TAKE (1) CAPSULE TWICE DAILY. 180  capsule 1  . sacubitril-valsartan (ENTRESTO) 49-51 MG Take 1 tablet by mouth 2 (two) times daily. 180 tablet 3  . spironolactone (ALDACTONE) 25 MG tablet Take 1 tablet (25 mg total) by mouth daily. 90 tablet 3  . triamcinolone cream (KENALOG) 0.1 % Apply 1 application topically daily as needed for dry skin.    . valACYclovir (VALTREX) 500 MG tablet Take 500 mg by mouth 2 (two) times daily as needed (cold sores).     No current facility-administered medications on  file prior to visit.    Allergies  Allergen Reactions  . Peanut-Containing Drug Products Anaphylaxis  . Sulfonamide Derivatives Anaphylaxis  . Amlodipine Swelling    Swelling in ankles  . Eliquis [Apixaban] Other (See Comments)    Back/hip pain  . Lisinopril Cough    cough  . Xarelto [Rivaroxaban] Other (See Comments)    Back/hip pain    There were no vitals taken for this visit.  No problem-specific Assessment & Plan notes found for this encounter. Assessment and Plan:  CHF medication titration: Patient is on target dose of spironolactone. With a BP of 98/52 at allergist im not comfortable increasing Entresto at this time. I have asked patient to start checking BP at home. I will call him in 2 weeks to see what his readings have been and see if we can increase Entresto. Cannot increase metoprolol due to excessive fatigue with metoprolol 100mg , even after 1 month. Weight is stable. Continue furosemide 80mg . I wrote on patients med list what his cardiac medications were for per request. I will get BMP today since spironolactone was increased last time. Will see if patient can d/c on decrease KCL supplements.  Thank you,  Ramond Dial, Pharm.D, BCPS, CPP [place]  [id number], [place], [place] [address]  Phone: [phone]; Fax: [phone]   01/11/2020 1:20 PM

## 2020-01-12 ENCOUNTER — Telehealth: Payer: Self-pay | Admitting: Pharmacist

## 2020-01-12 DIAGNOSIS — I5033 Acute on chronic diastolic (congestive) heart failure: Secondary | ICD-10-CM

## 2020-01-12 LAB — BASIC METABOLIC PANEL
BUN/Creatinine Ratio: 23 (ref 10–24)
BUN: 26 mg/dL (ref 8–27)
CO2: 21 mmol/L (ref 20–29)
Calcium: 8.3 mg/dL — ABNORMAL LOW (ref 8.6–10.2)
Chloride: 102 mmol/L (ref 96–106)
Creatinine, Ser: 1.14 mg/dL (ref 0.76–1.27)
GFR calc Af Amer: 68 mL/min/{1.73_m2} (ref >59)
GFR calc non Af Amer: 59 mL/min/{1.73_m2} — ABNORMAL LOW (ref >59)
Glucose: 110 mg/dL — ABNORMAL HIGH (ref 65–99)
Potassium: 4.8 mmol/L (ref 3.5–5.2)
Sodium: 138 mmol/L (ref 134–144)

## 2020-01-12 NOTE — Telephone Encounter (Addendum)
Scr bumped a little. Just under 30%. I think we ought to decrease spironolactone back to 12.5mg  daily. I spoke with patient who is in agreement to decrease to 12.5mg  daily. Will repeat BMP in 2 weeks.

## 2020-01-14 ENCOUNTER — Encounter: Payer: Self-pay | Admitting: Orthopedic Surgery

## 2020-01-14 NOTE — Progress Notes (Signed)
Office Visit Note   Patient: Justin [name]           Date of Birth: 1934/07/20           MRN: [id number] Visit Date: 01/10/2020 Requested by: Laurey Morale, MD [place],  [place] [address] PCP: Laurey Morale, MD  Subjective: Chief Complaint  Patient presents with  . Follow-up    HPI: Justin [name] is a patient with bilateral hip pain right worse than left which is worse with prolonged walking.  The pain is in his posterior buttock and thigh region.  Denies any groin pain.  Better when he stops walking.  He also reports some anterior pain with his right knee.  No pain at rest.  His pain is not severe that he is avoided painful activity.  He is 10 months postop from patellar revision.  Pain level 4 out of 10 when going up steps.  3 out of 10 to get up from a seated position.  [facility] therapy [facility].  The pain is worse in bilateral buttock region with ambulation.  Prednisone for his cough actually helped his low back pain.              ROS: All systems reviewed are negative as they relate to the chief complaint within the history of present illness.  Patient denies  fevers or chills.   Assessment & Plan: Visit Diagnoses:  1. Right knee pain, unspecified chronicity     Plan: Impression is mild right knee pain following patellar revision.  No effusion and radiographs look good today.  I think this is something we can watch.  Also he is satisfied with that explanation but just wants to make sure nothing is overtly wrong with that knee.  Also regarding his back and posterior buttock pain this sounds like spinal stenosis.  Discussed with him whether it was severe enough that he would undergo MRI scanning and injections in his back and it did not really meet that criteria so we will hold off on work-up for now.  Follow-up as needed. Follow-Up Instructions: Return if symptoms worsen or fail to improve.   Orders:  Orders Placed This Encounter  Procedures  . XR  KNEE 3 VIEW RIGHT   No orders of the defined types were placed in this encounter.     Procedures: No procedures performed   Clinical Data: No additional findings.  Objective: Vital Signs: There were no vitals taken for this visit.  Physical Exam:   Constitutional: Patient appears well-developed HEENT:  Head: Normocephalic Eyes:EOM are normal Neck: Normal range of motion Cardiovascular: Normal rate Pulmonary/chest: Effort normal Neurologic: Patient is alert Skin: Skin is warm Psychiatric: Patient has normal mood and affect    Ortho Exam: Ortho exam demonstrates full active and passive range of motion of his hips.  No groin pain with internal or external rotation of the leg.  Pedal pulses palpable.  Right knee has no real patellofemoral crepitus.  No effusion.  Extensor mechanism is intact.  No real tenderness around the patella.  No nerve root tension signs.  Mild pain with forward and lateral bending.  Specialty Comments:  No specialty comments available.  Imaging: No results found.   PMFS History: Patient Active Problem List   Diagnosis Date Noted  . Loosening of knee joint prosthesis ([place]) 05/18/2019  . Surgical counseling visit 04/30/2019  . Cough 04/30/2019  . Gastroesophageal reflux disease 04/30/2019  . Bilateral impacted cerumen  04/30/2019  . S/P ICD (internal cardiac defibrillator) procedure 05/03/2017  . NICM (nonischemic cardiomyopathy) ([place]) 05/02/2017  . 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 Severe aortic valve stenosis 03/04/2017  . Hypokalemia due to loss of potassium with diuresis 01/10/2017  . Aortic stenosis 01/10/2017  . Systolic heart failure ([place]) 01/10/2017  . Acute on chronic diastolic CHF (congestive heart failure) ([place]) 01/07/2017  . Hypothyroidism 12/23/2016  . Prostate cancer ([place])   . 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 Edema of both legs 09/01/2014  . Asthma, intermittent 09/01/2014  . Aortic valve  disorders 01/01/2011  . Coronary artery disease involving native coronary artery of native heart without angina pectoris   . ACUTE BRONCHITIS 09/06/2010  . GOUT 09/12/2009  . CELLULITIS, FOOT 08/15/2009  . CAD, AUTOLOGOUS BYPASS GRAFT 05/10/2009  . MURMUR 05/08/2009  . BRUIT 05/08/2009  . MUSCLE CRAMPS 12/12/2008  . KNEE SPRAIN 09/21/2008  . BENIGN PROSTATIC HYPERTROPHY 07/07/2008  . Osteoarthritis 07/07/2008  . CONTUSION, HIP 05/13/2008  . BENIGN PROSTATIC HYPERTROPHY, WITH OBSTRUCTION 02/01/2008  . LOW BACK PAIN SYNDROME 02/01/2008  . TESTOSTERONE DEFICIENCY 07/03/2007  . HYPERLIPIDEMIA 07/03/2007  . Essential hypertension 07/03/2007  . MYOCARDIAL INFARCTION, HX OF 07/03/2007  . Allergic rhinitis 07/03/2007  . ACTINIC KERATOSIS 07/03/2007  . COLONIC POLYPS, HX OF 07/03/2007  . ACNE ROSACEA, HX OF 07/03/2007  . 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   Past Medical History:  Diagnosis Date  . Age-related macular degeneration, dry, both eyes   . Allergic    "24/7; 365 days/year; I'm allergic to pollens, dust, all southern grasses/trees, mold, mildue, cats, dogs" (01/07/2017)  . Anal fissure   . Asthma    sees [name]   . Benign prostatic hypertrophy    (sees Dr. Denman George  . CAD (coronary artery disease)    a. s/p CABG 2007. b. Cath 08/2016 - 4/4 patent grafts.  . Carotid bruit    carotid u/s 10/10: 0.39% bilaterally  . Chronic combined systolic and diastolic CHF (congestive heart failure) ([place])   . Complication of anesthesia 1980s   "w/anal cyst OR, he gave me a saddle block then put a narcotic in spinal cord; had a severe reaction to that" (01/07/2017)  . Congestive heart failure (CHF) ([place])   . ED (erectile dysfunction)   . Family history of adverse reaction to anesthesia    "daughter wakes up during OR" (01/07/2017)  . GERD (gastroesophageal reflux disease)   . Gout   . HTN (hypertension)   . Hx of colonic polyps    (sees Dr. Henrene Pastor)  . Hyperlipidemia   . Hypothyroidism   .  Moderate to severe aortic stenosis    a. s/p TAVR 02/2017.  [name] Myocardial infarction ([place]) ~ 2000  . Obesity   . Osteoarthritis    "was in my knees, hands" (01/07/2017 )  . PAF (paroxysmal atrial fibrillation) ([place])    a. documented post TAVR.  [name] Precancerous skin lesion    (sees Dr. Allyson Sabal)  . Prostate cancer ([place]) dx'd ~ 2014  . 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 Heart attack Father 63  . Allergic rhinitis Father   . Asthma Father   . Heart failure Mother 87  . Uterine cancer Mother   . Breast cancer Mother   .  Colon cancer Neg Hx   . Esophageal cancer Neg Hx     Past Surgical History:  Procedure Laterality Date  . ANUS SURGERY     "opened it back up cause it wouldn't heal; wound up w/a fissure" (01/07/2017)  . BIV ICD INSERTION CRT-D N/A 05/02/2017   Procedure: BIV ICD INSERTION CRT-D;  Surgeon: Deboraha Sprang, MD;  Location: [facility];  Service: Cardiovascular;  Laterality: N/A;  . CARDIAC CATHETERIZATION  10/29/2005  . CARDIAC CATHETERIZATION N/A 08/27/2016   Procedure: Right/Left Heart Cath and Coronary/Graft Angiography;  Surgeon: Burnell Blanks, MD;  Location: [facility];  Service: Cardiovascular;  Laterality: N/A;  . CATARACT EXTRACTION W/ INTRAOCULAR LENS  IMPLANT, BILATERAL Bilateral   . CATARACT EXTRACTION, BILATERAL  2012  . COLONOSCOPY  06/30/2008   no repeats needed   . COLONOSCOPY     had 3 or 4 in the past   . CORONARY ARTERY BYPASS GRAFT  2007   "CABG X4"  . CYST EXCISION PERINEAL  1980s  . HAMMER TOE SURGERY Bilateral   . JOINT REPLACEMENT    . KNEE ARTHROPLASTY  07/30/2011   Procedure: COMPUTER ASSISTED TOTAL KNEE ARTHROPLASTY;  Surgeon: Meredith Pel;  Location: [facility];  Service: Orthopedics;  Laterality: Left;  left total knee  arthroplasty  . MASTECTOMY SUBCUTANEOUS Bilateral   . MULTIPLE TOOTH EXTRACTIONS    . ORIF FINGER / THUMB FRACTURE Right ~ 1980   "repair of thumb injury"  . PROSTATE BIOPSY    . REPLACEMENT TOTAL KNEE BILATERAL Bilateral 2012  . TEE WITHOUT CARDIOVERSION N/A 03/04/2017   Procedure: TRANSESOPHAGEAL ECHOCARDIOGRAM (TEE);  Surgeon: Burnell Blanks, MD;  Location: [facility];  Service: Open Heart Surgery;  Laterality: N/A;  . TOTAL KNEE REVISION Right 05/18/2019   Procedure: RIGHT PATELLA REVISION/REMOVAL;  Surgeon: Meredith Pel, MD;  Location: [facility];  Service: Orthopedics;  Laterality: Right;  . TRANSCATHETER AORTIC VALVE REPLACEMENT, TRANSFEMORAL N/A 03/04/2017   Procedure: TRANSCATHETER AORTIC VALVE REPLACEMENT, TRANSFEMORAL;  Surgeon: Burnell Blanks, MD;  Location: [facility];  Service: Open Heart Surgery;  Laterality: N/A;   Social History   Occupational History  . Occupation: Clinical biochemist    Comment: builds malls  Tobacco Use  . Smoking status: Former Smoker    Packs/day: 3.50    Years: 13.00    Pack years: 45.50    Types: Cigarettes    Quit date: 1963    Years since quitting: 58.4  . Smokeless tobacco: Never Used  Substance and Sexual Activity  . Alcohol use: Yes    Alcohol/week: 0.0 standard drinks    Comment: 01/07/2017 "might have 2 beers 2-3 times/month"  . Drug use: No  . Sexual activity: Never

## 2020-01-17 ENCOUNTER — Other Ambulatory Visit: Payer: Self-pay | Admitting: Family Medicine

## 2020-01-17 ENCOUNTER — Ambulatory Visit (INDEPENDENT_AMBULATORY_CARE_PROVIDER_SITE_OTHER): Payer: Medicare Other

## 2020-01-17 DIAGNOSIS — Z95810 Presence of automatic (implantable) cardiac defibrillator: Secondary | ICD-10-CM | POA: Diagnosis not present

## 2020-01-17 DIAGNOSIS — I5020 Unspecified systolic (congestive) heart failure: Secondary | ICD-10-CM | POA: Diagnosis not present

## 2020-01-17 NOTE — Progress Notes (Signed)
[facility] PREP Weekly Session   Patient Details  Name: Justin [name] MRN: [id number] Date of Birth: 1934/06/05 Age: 86 y.o. PCP: Laurey Morale, MD  Vitals:   01/17/20 1603  Weight: 264 lb (119.7 kg)    [facility] Weekly seesion - 01/17/20 1600      Weekly Session   Topic Discussed  Health habits   Healthy eating was topic for last week    Minutes exercised this week  260 minutes    Classes attended to date  7      Fun things since last meeting: plan travel Grateful for: life Nutrition celebration: sourdough bread Barriers/struggles: right hip pain     Barnett Hatter 01/17/2020, 4:05 PM

## 2020-01-18 ENCOUNTER — Telehealth: Payer: Self-pay

## 2020-01-18 NOTE — Telephone Encounter (Signed)
Spoke with patient to remind of missed remote transmission. Attempted to walk through transmission but reader needs to charge. Will let charge for at least 20 minutes then try again to transmit.

## 2020-01-19 ENCOUNTER — Ambulatory Visit (INDEPENDENT_AMBULATORY_CARE_PROVIDER_SITE_OTHER): Payer: Medicare Other

## 2020-01-19 DIAGNOSIS — J309 Allergic rhinitis, unspecified: Secondary | ICD-10-CM | POA: Diagnosis not present

## 2020-01-19 NOTE — Progress Notes (Signed)
EPIC Encounter for ICM Monitoring  Patient Name: Justin [name] is a 86 y.o. male Date: 01/19/2020 Primary Care Physican: Laurey Morale, MD Primary Cardiologist:[place] Electrophysiologist:Klein Bi-V Pacing:85.3% 5/13/2021Office Weight: [id number]   Spoke with patient and reports feeling well at this time.  Denies fluid symptoms.  He has a persistent dry cough and will discuss with Dr Sarajane Jews.  He has already discussed the cough with pulmonologist and was described prednisone for a week which improved but returned after prednisone was completed.  He does not think it is allergies.   Optivol thoracic impedancenormal.  Prescribed: Furosemide80 mgtaketake 1 tablet once a day alternating with one twice a day.  Recommendations: No changes and encouraged to call if experiencing any fluid symptoms.  Follow-up plan: ICM clinic phone appointment on6/28/2021. 91 day device clinic remote transmission 04/10/2020.  Copy of ICM check sent to [place].  3 month ICM trend: 01/18/2020    1 Year ICM trend:       [name], RN 01/19/2020 1:59 PM

## 2020-01-26 ENCOUNTER — Encounter: Payer: Self-pay | Admitting: Family Medicine

## 2020-01-26 ENCOUNTER — Other Ambulatory Visit: Payer: Medicare Other

## 2020-01-26 ENCOUNTER — Other Ambulatory Visit: Payer: Self-pay

## 2020-01-26 ENCOUNTER — Telehealth (INDEPENDENT_AMBULATORY_CARE_PROVIDER_SITE_OTHER): Payer: Medicare Other | Admitting: Family Medicine

## 2020-01-26 ENCOUNTER — Ambulatory Visit (INDEPENDENT_AMBULATORY_CARE_PROVIDER_SITE_OTHER): Payer: Medicare Other

## 2020-01-26 DIAGNOSIS — R053 Chronic cough: Secondary | ICD-10-CM

## 2020-01-26 DIAGNOSIS — J309 Allergic rhinitis, unspecified: Secondary | ICD-10-CM | POA: Diagnosis not present

## 2020-01-26 DIAGNOSIS — R05 Cough: Secondary | ICD-10-CM | POA: Diagnosis not present

## 2020-01-26 NOTE — Progress Notes (Signed)
   Subjective:    Patient ID: Justin [name], male    DOB: [birth year], 86 y.o.   MRN: [id number]  HPI Virtual Visit via Telephone Note  I connected with the patient on 01/26/20 at 11:15 AM EDT by telephone and verified that I am speaking with the correct person using two identifiers.   I discussed the limitations, risks, security and privacy concerns of performing an evaluation and management service by telephone and the availability of in person appointments. I also discussed with the patient that there may be a patient responsible charge related to this service. The patient expressed understanding and agreed to proceed.  Location patient: home Location provider: work or home office Participants present for the call: patient, provider Patient did not have a visit in the prior 7 days to address this/these issue(s).   History of Present Illness: He has a question about his chronic cough. This started about a year ago. He had a normal CXR last September. He talked about this to the Pulmonary provider last month, and they did not have a specific explanation. His is not on an ACE inhibitor and he takes Omeprazole BID. The cough is dry, and he does not feel ill. No chest pain or fever or SOB. He still takes allergy shots and takes his usual allergy medications. He has had both Covid vaccines.    Observations/Objective: Patient sounds cheerful and well on the phone. I do not appreciate any SOB. Speech and thought processing are grossly intact. Patient reported vitals:  Assessment and Plan: Chronic cough. I agreed that none of his medications are causing this. He likely has an irritable larynx syndrome. He can use OTC cough syrups as needed. Recheck prn.  [name], MD   Follow Up Instructions:     [phone] 5-10 [phone] 11-20 [phone] 21-30 I did not refer this patient for an OV in the next 24 hours for this/these issue(s).  I discussed the assessment and treatment plan with the patient.  The patient was provided an opportunity to ask questions and all were answered. The patient agreed with the plan and demonstrated an understanding of the instructions.   The patient was advised to call back or seek an in-person evaluation if the symptoms worsen or if the condition fails to improve as anticipated.  I provided 13 minutes of non-face-to-face time during this encounter.   [name], MD    Review of Systems     Objective:   Physical Exam        Assessment & Plan:

## 2020-01-31 ENCOUNTER — Other Ambulatory Visit: Payer: Self-pay | Admitting: Family Medicine

## 2020-01-31 ENCOUNTER — Other Ambulatory Visit: Payer: Medicare Other

## 2020-01-31 ENCOUNTER — Ambulatory Visit (INDEPENDENT_AMBULATORY_CARE_PROVIDER_SITE_OTHER): Payer: Medicare Other

## 2020-01-31 ENCOUNTER — Other Ambulatory Visit: Payer: Self-pay

## 2020-01-31 DIAGNOSIS — J309 Allergic rhinitis, unspecified: Secondary | ICD-10-CM | POA: Diagnosis not present

## 2020-01-31 DIAGNOSIS — I5033 Acute on chronic diastolic (congestive) heart failure: Secondary | ICD-10-CM

## 2020-01-31 LAB — BASIC METABOLIC PANEL
BUN/Creatinine Ratio: 23 (ref 10–24)
BUN: 22 mg/dL (ref 8–27)
CO2: 22 mmol/L (ref 20–29)
Calcium: 8.3 mg/dL — ABNORMAL LOW (ref 8.6–10.2)
Chloride: 103 mmol/L (ref 96–106)
Creatinine, Ser: 0.96 mg/dL (ref 0.76–1.27)
GFR calc Af Amer: 84 mL/min/{1.73_m2} (ref >59)
GFR calc non Af Amer: 73 mL/min/{1.73_m2} (ref >59)
Glucose: 123 mg/dL — ABNORMAL HIGH (ref 65–99)
Potassium: 4.7 mmol/L (ref 3.5–5.2)
Sodium: 137 mmol/L (ref 134–144)

## 2020-01-31 NOTE — Progress Notes (Signed)
[facility] PREP Weekly Session   Patient Details  Name: Justin [name] MRN: [id number] Date of Birth: 1934/07/22 Age: 86 y.o. PCP: Laurey Morale, MD  Vitals:   01/31/20 1637  Weight: 262 lb (118.8 kg)    [facility] Weekly seesion - 01/31/20 1600      Weekly Session   Topic Discussed  Restaurant Eating   salt demo   Minutes exercised this week  250 minutes    Classes attended to date  10      Fun things since last meeting: cook, write Grateful for: pain killer Nutrition celebration: onion soup Barriers/struggles: sleep   Barnett Hatter 01/31/2020, 4:38 PM

## 2020-02-01 ENCOUNTER — Telehealth: Payer: Self-pay | Admitting: Pharmacist

## 2020-02-01 DIAGNOSIS — I10 Essential (primary) hypertension: Secondary | ICD-10-CM

## 2020-02-01 DIAGNOSIS — I428 Other cardiomyopathies: Secondary | ICD-10-CM

## 2020-02-01 NOTE — Telephone Encounter (Signed)
Called patient to review lab results which are stable. Depending on home BP will consider increasing Entresto. LVM for patient to call back

## 2020-02-02 MED ORDER — ENTRESTO 97-103 MG PO TABS: 1.0000 | ORAL_TABLET | Freq: Two times a day (BID) | ORAL | 11 refills | Status: DC

## 2020-02-02 NOTE — Addendum Note (Signed)
Addended by: [name] D on: 02/02/2020 01:50 PM   Modules accepted: Orders

## 2020-02-02 NOTE — Telephone Encounter (Signed)
Spoke with patient. Reviewed labs-stable. Blood pressure ranges about 784-784 systolic. Patient agreeable to increasing Entresto to 97/103mg  BID. He will take 2 tablets of the 49/51mg  BID until he runs out and then 1 tablet of the 97/103mg  BID thereafter. Patient had only been taking 1 tablet (50mg  of metoprolol). Instructed to increase to 1.5 tablets (75mg ) daily. Recheck BMP on 6/25.

## 2020-02-02 NOTE — Telephone Encounter (Signed)
Patient is returning call regarding lab results

## 2020-02-03 DIAGNOSIS — L821 Other seborrheic keratosis: Secondary | ICD-10-CM | POA: Diagnosis not present

## 2020-02-03 DIAGNOSIS — L819 Disorder of pigmentation, unspecified: Secondary | ICD-10-CM | POA: Diagnosis not present

## 2020-02-03 DIAGNOSIS — L570 Actinic keratosis: Secondary | ICD-10-CM | POA: Diagnosis not present

## 2020-02-03 DIAGNOSIS — D229 Melanocytic nevi, unspecified: Secondary | ICD-10-CM | POA: Diagnosis not present

## 2020-02-03 DIAGNOSIS — L814 Other melanin hyperpigmentation: Secondary | ICD-10-CM | POA: Diagnosis not present

## 2020-02-07 NOTE — Progress Notes (Signed)
[facility] PREP Weekly Session   Patient Details  Name: Justin [name] MRN: [id number] Date of Birth: Oct 14, 1933 Age: 86 y.o. PCP: Laurey Morale, MD  Vitals:   02/07/20 1607  Weight: 261 lb (118.4 kg)     [facility] Weekly seesion - 02/07/20 1600      Weekly Session   Topic Discussed Stress management and problem solving    Minutes exercised this week 300 minutes    Classes attended to date 50          Fun things since last meeting: cooked Grateful for: local bakery Nutrition celebration: new bread Barriers/struggles: weather   Barnett Hatter 02/07/2020, 4:08 PM

## 2020-02-14 ENCOUNTER — Ambulatory Visit (INDEPENDENT_AMBULATORY_CARE_PROVIDER_SITE_OTHER): Payer: Medicare Other

## 2020-02-14 DIAGNOSIS — J309 Allergic rhinitis, unspecified: Secondary | ICD-10-CM

## 2020-02-14 NOTE — Progress Notes (Signed)
[facility] PREP Weekly Session   Patient Details  Name: Justin [name] MRN: [id number] Date of Birth: 1934/07/04 Age: 86 y.o. PCP: Laurey Morale, MD  Vitals:   02/14/20 1551  Weight: 260 lb (117.9 kg)     [facility] Weekly seesion - 02/14/20 1500      Weekly Session   Topic Discussed Expectations and non-scale victories    Minutes exercised this week 270 minutes    Classes attended to date 66          Grateful for: 25 years of marriage Nutrition celebration: sourdough bread Barriers/struggles: some weather  Barnett Hatter 02/14/2020, 3:52 PM

## 2020-02-16 ENCOUNTER — Ambulatory Visit: Payer: Medicare Other | Admitting: Pharmacist

## 2020-02-16 ENCOUNTER — Other Ambulatory Visit: Payer: Self-pay

## 2020-02-16 DIAGNOSIS — I10 Essential (primary) hypertension: Secondary | ICD-10-CM

## 2020-02-16 DIAGNOSIS — E785 Hyperlipidemia, unspecified: Secondary | ICD-10-CM

## 2020-02-16 NOTE — Chronic Care Management (AMB) (Signed)
Chronic Care Management Pharmacy  Name: Justin [name]  MRN: [id number] DOB: 09/22/1933   Chief Complaint/ HPI  Justin [name],  86 y.o. , male presents for their Initial CCM visit with the clinical pharmacist In office. 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 He is married and his wife helps her with driving since he mentions that his mascular degeneration is getting worse. He brought all of his medications today with him on ziplock bags labeled AM, AM/PM, PM. Everything is sorted out for him and he wanted me to check all his medications if the way he sorts them out is optimal.   PCP : Laurey Morale, MD  Their chronic conditions include: HTN, HLD, combined systolic and diastolic CHF, CAD s/p CABG,    Office Visits: 01/26/20 OV - Chronic cough. Irritable larynx syndrome. Use OTC cough syrups as needed.  Consult Visit: 01/11/20 OV (Maccia, Cardio) - f/u acute on chronic diastolic HF. Patient controlled. On target dose of spironolactone. BMP today due to increased dose of spironolactone. GERD, hypothyroidism, allergic rhinitis  Medications: Outpatient Encounter Medications as of 02/16/2020  Medication Sig  . aspirin 81 MG tablet Take 1 tablet (81 mg total) by mouth daily.  [name] atorvastatin (LIPITOR) 20 MG tablet TAKE 1 TABLET ONCE DAILY.  [name] Azelastine HCl 0.15 % SOLN Place 2 sprays into the nose in the morning and at bedtime.  . benzonatate (TESSALON) 200 MG capsule TAKE 1 CAPSULE EVERY 8 HOURS AS NEEDED FOR COUGH.  . cetirizine (ZYRTEC) 10 MG tablet Take 10 mg by mouth daily.  . diphenhydrAMINE (DIPHENHIST) 25 MG tablet Take 25 mg by mouth at bedtime.   . famotidine (PEPCID) 10 MG tablet Take 20 mg by mouth at bedtime.   . fluticasone (FLONASE) 50 MCG/ACT nasal spray Place 2 sprays into both nostrils daily.  . furosemide (LASIX) 80 MG tablet Take 1 tablet (80 mg total) by mouth daily.  . isosorbide mononitrate (IMDUR) 30  MG 24 hr tablet TAKE 1 TABLET ONCE DAILY.  [name] levothyroxine (SYNTHROID) 100 MCG tablet Take 1 tablet (100 mcg total) by mouth daily.  . metoprolol succinate (TOPROL-XL) 50 MG 24 hr tablet Take 1.5 tablets (75 mg total) by mouth daily. Take with or immediately following a meal.  . montelukast (SINGULAIR) 10 MG tablet TAKE 1 TABLET ONCE DAILY IN THE EVENING.  . Multiple Vitamins-Minerals (PRESERVISION AREDS PO) Take 2 tablets by mouth every morning.  [name] omeprazole (PRILOSEC) 20 MG capsule TAKE (1) CAPSULE TWICE DAILY.  [name] potassium chloride SA (K-DUR) 20 MEQ tablet Take 2 tablets by mouth twice daily alternating with 2 tablets daily. (Patient taking differently: Take 40 mEq by mouth See admin instructions. Take 40 meq by mouth once a day, alternating with twice daily to coincide with furosemide)  . PRADAXA 150 MG CAPS capsule TAKE (1) CAPSULE TWICE DAILY.  . sacubitril-valsartan (ENTRESTO) 97-103 MG Take 1 tablet by mouth 2 (two) times daily.  [name] spironolactone (ALDACTONE) 25 MG tablet Take 1 tablet (25 mg total) by mouth daily.  [name] triamcinolone cream (KENALOG) 0.1 % Apply 1 application topically daily as needed for dry skin.  . valACYclovir (VALTREX) 500 MG tablet Take 500 mg by mouth 2 (two) times daily as needed (cold sores).  . chlorpheniramine (CHLOR-TRIMETON) 4 MG tablet Take 2 tablets (8 mg total) by mouth at bedtime. (Patient not taking: Reported on 02/17/2020)  . EPINEPHrine 0.3 mg/0.3 mL IJ SOAJ injection Inject  0.3 mLs (0.3 mg total) into the muscle as needed for anaphylaxis. (Patient not taking: Reported on 02/17/2020)  . hydrOXYzine (ATARAX/VISTARIL) 10 MG tablet Take 10 mg by mouth at bedtime. (Patient not taking: Reported on 02/17/2020)  . Ketotifen Fumarate (ZADITOR OP) Apply 1 drop to eye daily as needed (itchy eyes).  (Patient not taking: Reported on 02/17/2020)   No facility-administered encounter medications on file as of 02/16/2020.     Transportation Needs: No Transportation Needs  .  Lack of Transportation (Medical): No  . Lack of Transportation (Non-Medical): No     Financial Resource Strain: Low Risk   . Difficulty of Paying Living Expenses: Not hard at all    Current Diagnosis/Assessment:  Goals Addressed            This Visit's Progress   . Chronic Care Management       CARE PLAN ENTRY  Current Barriers:  . Chronic Disease Management support, education, and care coordination needs related to Hypertension, Hyperlipidemia, and Heart Failure   Hypertension BP Readings from Last 3 Encounters:  01/11/20 (!) 116/50  01/06/20 (!) 98/58  12/28/19 110/60   . Pharmacist Clinical Goal(s): o Over the next 60 days, patient will work with PharmD and providers to maintain BP goal <140/90 . Current regimen:  . Furosemide 80 mg 1 tablet daily . Potassium chloride 20 meq 2 tablet once daily . Isosorbide mononitrate 30 mg 1 tablet daily . Metoprolol succinate 50 mg 1.5 tablet daily with or immediately following a meal . Spironolactone 25 mg 1 tablet daily . Interventions: o Discussed low salt diet and exercising as tolerated extensively . Patient self care activities - Over the next 60 days, patient will: o Check BP once a day, document, and provide at future appointments o Ensure daily salt intake < 2300 mg/day  Hyperlipidemia Lab Results  Component Value Date/Time   LDLCALC 49 03/11/2019 08:58 AM   LDLCALC 73 08/11/2018 07:35 AM   . Pharmacist Clinical Goal(s): o Over the next 60 days, patient will work with PharmD and providers to maintain LDL goal < 100 . Current regimen:  o Atorvastatin 20 mg 1 tablet daily . Interventions: o Discussed low cholesterol diet and exercising as tolerated extensively . Patient self care activities - Over the next 60 days, patient will: o Continue with low salt intake and exercise as tolerated  Heart Failure . Pharmacist Clinical Goal(s) o Over the next 60 days, patient will work with PharmD and providers to minimize  symptoms of CHF . Current regimen:   Furosemide 80 mg 1 tablet daily  Isosorbide mononitrate 30 mg 1 tablet daily  Metoprolol succinate 50 mg 1.5 tablet by mouth daily with or immediately following a meal  Entresto 97-103 mg 1 tablet twice daily  Spironolactone 25 mg 1 tablet daily Interventions: o Dicussed with patient the benefits of low salt diet and exercise o Counseled to weigh daily and monitor for 2-3 lbs gain in 24 hrs or 5 lbs in a week. . Patient self care activities - Over the next 60 days, patient will: o Continue to monitor weight and take medication as prescribed o Continue using salt alternatives and maintain low salt diet  Medication management . Pharmacist Clinical Goal(s): o Over the next 60 days, patient will work with PharmD and providers to maintain optimal medication adherence . Current pharmacy: [facility] . Interventions o Comprehensive medication review performed. o Continue current medication management strategy . Patient self care activities - Over the  next 60 days, patient will: o Focus on medication adherence by using weekly pill organizers o Take medications as prescribed o Report any questions or concerns to PharmD and/or provider(s)  Initial goal documentation        Heart Failure   Type: Combined Systolic and Diastolic  Last ejection fraction: 30-35% (04/2019) NYHA Class: III (marked limitation of activity) AHA HF Stage: D (Advanced disease requiring aggressive medical therapy)  Patient has failed these meds in past: none Patient is currently controlled on the following medications:   Furosemide 80 mg 1 tablet daily  Isosorbide mononitrate 30 mg 1 tablet daily  Metoprolol succinate 50 mg 1.5 tablet by mouth daily with or immediately following a meal  Entresto 97-103 mg 1 tablet twice daily  Spironolactone 25 mg 1 tablet daily  Followed by Dr. Angelena Form. Patient stable. Reports minimal bilateral lower extremity edema.  Patient reports pressure around 110/70 at home. Patient reports not using salt at all and uses salt alternatives and herbs to flavor his food. Patient tries to be active by walking and going out as often. Denies muscle cramps when using furosemide and potassium.  Plan  Continue current medications    Hypertension    Office blood pressures are  BP Readings from Last 3 Encounters:  01/11/20 (!) 116/50  01/06/20 (!) 98/58  12/28/19 110/60    Patient has failed these meds in the past: losartan Patient is currently controlled on the following medications:  . Furosemide 80 mg 1 tablet daily . Potassium chloride 20 meq 2 tablet once daily . Isosorbide mononitrate 30 mg 1 tablet daily . Metoprolol succinate 50 mg 1.5 tablet daily with or immediately following a meal . Spironolactone 25 mg 1 tablet daily  Patient checks BP at home daily  Patient home BP readings are ranging: 110/70  Patient BP stable within goal < 130/80. Patient has been eating well and reports using salt alternatives and herbs to flavor food. He mentions preparing his food more often. He reports going out more often and stay active. He mentions that he was still able to walk in [place] for 3-4 hrs with minimal back pain and knee pain.   Plan  Continue control with diet and exercise    Hyperlipidemia   Lipid Panel     Component Value Date/Time   CHOL 115 03/11/2019 0858   CHOL 171 08/11/2018 0735   TRIG 170.0 (H) 03/11/2019 0858   TRIG 143 06/30/2006 1132   HDL 31.30 (L) 03/11/2019 0858   HDL 34 (L) 08/11/2018 0735   CHOLHDL 4 03/11/2019 0858   VLDL 34.0 03/11/2019 0858   LDLCALC 49 03/11/2019 0858   LDLCALC 73 08/11/2018 0735   LABVLDL 64 (H) 08/11/2018 0735     The ASCVD Risk score (Goff DC Jr., et al., 2013) failed to calculate for the following reasons:   The 2013 ASCVD risk score is only valid for ages 74 to 66   The patient has a prior MI or stroke diagnosis   Patient has failed these meds in  past: none Patient is currently controlled on the following medications:  . Atorvastatin 20 mg 1 tablet daily  Lipid panel within goal except TG at 170 (02/2019). Will need to order lipid panel to assess current state. Patient remains mindful of his diet and exercise. Denies myalgias associated with atorvastatin.  Plan  Continue control with diet and exercise   CAD s/p CABG   Patient has failed these meds in past: none Patient is currently  controlled on the following medications:  . Pradaxa 150 mg 1 capsule twice daily  Followed by Angelena Form. Patient has been on pradaxa for a long time. He mentions the only thing is the cost which he pays $130 a month. Told patient that other alternatives would be around that cost. Patient understands and he mentions he could still afford the medication with problem. Denies severe bleeding and bruising with pradaxa.   Plan  Continue current medications    GERD   Patient has failed these meds in past: none  Patient is currently controlled on the following medications:  . Famotidine 10 mg 1 tablet at bedtime . Omeprazole 20 mg 1 capsule twice daily  Patient reports acid reflux and heart burn stable and has not had any problems for the past months. I recommended if he is willing to see if we could taper his omeprazole to just taking it in the morning instead of twice a day as a trial. Counseled on long term consequences of PPI. Patient understands and agrees.  Plan  Recommend decreasing dose of Omeprazole 20 mg to once daily in the morning and re-evaluate after a week   Hypothyroidism   Lab Results  Component Value Date/Time   TSH 3.69 05/31/2019 08:34 AM   TSH 5.28 (H) 03/11/2019 08:58 AM   FREET4 0.89 05/31/2019 08:34 AM   FREET4 0.67 03/11/2019 08:58 AM    Patient has failed these meds in past: none Patient is currently controlled on the following medications:  . Levothyroxine 100 mcg 1 tablet daily  Patient reports feeling well and  energized. No signs of fatigue, except for symptoms of CHF.  Plan  Continue current medications   Allergic Rhinitis   Patient has failed these meds in past: none Patient is currently controlled on the following medications:  . Azelastine 0.15% 2 sprays into the nose in the morning and at bedtime . Cetirizine 10 mg 1 tablet daily . Flonase 2 sprays into both nostrils daily . Montelukast 10 mg 1 tablet in the evening . Benedaryl 25 mg 2 tablets at bedtime . Benzonatate 200 mg 1 capsule every 8 hrs as needed for cough  Plan  Continue current medications    OTCs/Health Maintenance   Patient is currently controlled on the following medications: . Aspirin 81 mg 1 tablet daily . Triamcinolone 0.1% cream once daily PRN for dry skin . Valtrex 500 mg 1 tablet twice daily as needed for cold sores  Plan  Continue current medications   Vaccines   Reviewed and discussed patient's vaccination history.    Immunization History  Administered Date(s) Administered  . Fluad Quad(high Dose 65+) 05/06/2019  . Hep A / Hep B 04/06/2012, 10/07/2012  . Hepatitis B 05/07/2012  . Influenza Split 06/28/2013  . Influenza Whole 06/05/2007, 05/13/2008, 06/06/2009, 05/30/2010  . Influenza, High Dose Seasonal PF 05/15/2018  . Influenza,inj,Quad PF,6+ Mos 05/20/2016  . Influenza-Unspecified 05/25/2014, 07/08/2015, 05/06/2017  . PFIZER SARS-COV-2 Vaccination 09/28/2019, 10/12/2019  . Pneumococcal Conjugate-13 09/18/2015  . Pneumococcal Polysaccharide-23 05/13/2008, 05/25/2014  . Td 05/30/2010  . Zoster 10/21/2007  . Zoster Recombinat (Shingrix) 05/18/2018, 10/16/2018    Plan  Patient updated with vaccines.  Medication Management   Pharmacy/Benefits: [facility] Adherence: Uses weekly organizers. Wife helps her with her medications since he has difficulty seeing. Pt endorses 100% compliance  Patient goes to [facility] in [facility] and has been their  customer for years. He mentions that he takes care of him  especially when he goes out of the country and needs more day supply of his prescriptions. He mentions that they know him well and he takes care of him greatly.   Plan  Continue current medication management strategy   Follow up: 2 month office visit  Geraldine Contras, PharmD Clinical Pharmacist [facility] [place] [phone]

## 2020-02-17 DIAGNOSIS — H353221 Exudative age-related macular degeneration, left eye, with active choroidal neovascularization: Secondary | ICD-10-CM | POA: Diagnosis not present

## 2020-02-17 NOTE — Patient Instructions (Addendum)
Visit Information  It was a pleasure meeting with you today, [name]! Do not worry, I have not forgotten about your offer. I will see you again in 2 months after your visit with Dr. Clent Ridges! Thank you for bringing your medications today. I look forward to working with you to achieve your health care goals. Below is a summary of what we talked about during the visit:  Goals Addressed            This Visit's Progress   . COMPLETED: Blood Pressure < 130/80      . Chronic Care Management       CARE PLAN ENTRY  Current Barriers:  . Chronic Disease Management support, education, and care coordination needs related to Hypertension, Hyperlipidemia, and Heart Failure   Hypertension BP Readings from Last 3 Encounters:  01/11/20 (!) 116/50  01/06/20 (!) 98/58  12/28/19 110/60   . Pharmacist Clinical Goal(s): o Over the next 60 days, patient will work with PharmD and providers to maintain BP goal <130/80 . Current regimen:  . Furosemide 80 mg 1 tablet daily . Potassium chloride 20 meq 2 tablet once daily . Isosorbide mononitrate 30 mg 1 tablet daily . Metoprolol succinate 50 mg 1.5 tablet daily with or immediately following a meal . Spironolactone 25 mg 1 tablet daily . Interventions: o Discussed low salt diet and exercising as tolerated extensively . Patient self care activities - Over the next 60 days, patient will: o Check BP once a day, document, and provide at future appointments o Ensure daily salt intake < 2300 mg/day  Hyperlipidemia Lab Results  Component Value Date/Time   LDLCALC 49 03/11/2019 08:58 AM   LDLCALC 73 08/11/2018 07:35 AM   . Pharmacist Clinical Goal(s): o Over the next 60 days, patient will work with PharmD and providers to maintain LDL goal < 100 . Current regimen:  o Atorvastatin 20 mg 1 tablet daily . Interventions: o Discussed low cholesterol diet and exercising as tolerated extensively . Patient self care activities - Over the next 60 days, patient  will: o Continue with low salt intake and exercise as tolerated  Heart Failure . Pharmacist Clinical Goal(s) o Over the next 60 days, patient will work with PharmD and providers to minimize symptoms of CHF . Current regimen:   Furosemide 80 mg 1 tablet daily  Isosorbide mononitrate 30 mg 1 tablet daily  Metoprolol succinate 50 mg 1.5 tablet by mouth daily with or immediately following a meal  Entresto 97-103 mg 1 tablet twice daily  Spironolactone 25 mg 1 tablet daily Interventions: o Dicussed with patient the benefits of low salt diet and exercise o Counseled to weigh daily and monitor for 2-3 lbs gain in 24 hrs or 5 lbs in a week. . Patient self care activities - Over the next 60 days, patient will: o Continue to monitor weight and take medication as prescribed o Continue using salt alternatives and maintain low salt diet  Medication management . Pharmacist Clinical Goal(s): o Over the next 60 days, patient will work with PharmD and providers to maintain optimal medication adherence . Current pharmacy: [facility] . Interventions o Comprehensive medication review performed. o Continue current medication management strategy . Patient self care activities - Over the next 60 days, patient will: o Focus on medication adherence by using weekly pill organizers o Take medications as prescribed o Report any questions or concerns to PharmD and/or provider(s)  Initial goal documentation        Mr.  [name] was given information about Chronic Care Management services today including:  1. CCM service includes personalized support from designated clinical staff supervised by his physician, including individualized plan of care and coordination with other care providers 2. 24/7 contact phone numbers for assistance for urgent and routine care needs. 3. Standard insurance, coinsurance, copays and deductibles apply for chronic care management only during months in which we provide  at least 20 minutes of these services. Most insurances cover these services at 100%, however patients may be responsible for any copay, coinsurance and/or deductible if applicable. This service may help you avoid the need for more expensive face-to-face services. 4. Only one practitioner may furnish and bill the service in a calendar month. 5. The patient may stop CCM services at any time (effective at the end of the month) by phone call to the office staff.  Patient agreed to services and verbal consent obtained.   The patient verbalized understanding of instructions provided today and agreed to receive a mailed copy of patient instruction and/or educational materials. Face to Face appointment with pharmacist scheduled for: 03/28/20 at [place], PharmD Clinical Pharmacist [facility] [phone]    Heart Failure and Exercise Heart failure is a condition in which the heart does not fill or pump enough blood and oxygen to support your body and its functions. Heart failure is a long-term (chronic) condition. Living with heart failure can be challenging. However, following your health care provider's instructions about a healthy lifestyle may help improve your symptoms. This includes choosing the right exercise plan. Doing daily physical activity is important after a diagnosis of heart failure. You may have some activity restrictions, so talk to your health care provider before doing any exercises. What are the benefits of exercise? Exercise may:  Make your heart muscles stronger.  Lower your blood pressure.  Lower your cholesterol.  Help you lose weight.  Help your bones stay strong.  Improve your blood circulation.  Help your body use oxygen better. This relieves symptoms such as fatigue and shortness of breath.  Help your mental health by lowering the risk of depression and other problems.  Improve your quality of life.  Decrease your chance of  hospital admission for heart failure. What is an exercise plan? An exercise plan is a set of specific exercises and training activities. You will work with your health care provider to create the exercise plan that works for you. The plan may include:  Different types of exercises and how to do them.  Cardiac rehabilitation exercises. These are supervised programs that are designed to strengthen your heart. What are strengthening exercises? Strengthening exercises are a type of physical activity that involves using resistance to improve your muscle strength. Strengthening exercises usually have repetitive motions. These types of exercises can include:  Lifting weights.  Using weight machines.  Using resistance tubes and bands.  Using kettlebells.  Using your body weight, such as doing push-ups or squats. What are balance exercises? Balance exercises are another type of physical activity. They strengthen the muscles of the back, stomach, and pelvis (core muscles) and improve your balance. They can also lower your risk of falling. These types of exercises can include:  Standing on one leg.  Walking backward, sideways, and in a straight line.  Standing up after sitting, without using your hands.  Shifting your weight from one leg to the other.  Lifting one leg in front of you.  Doing tai chi. This is  a type of exercise that uses slow movements and deep breathing. How can I increase my flexibility? Having better flexibility can keep you from falling. It can also lengthen your muscles, improve your range of motion, and help your joints. You can increase your flexibility by:  Doing tai chi.  Doing yoga.  Stretching. How much aerobic exercise should I get?  Aerobic exercises strengthen your breathing and circulation system and increase your body's use of oxygen. Examples of aerobic exercise include biking, walking, running, and swimming. Talk to your health care provider to find  out how much aerobic exercise is safe for you.  To do these exercises:  Start exercising slowly, limiting the amount of time at first. You may need to start with 5 minutes of aerobic exercise every day.  Slowly add more minutes until you can safely do at least 30 minutes of exercise at least 4 days a week. Summary  Daily physical activity is important after a diagnosis of heart failure.  Exercise can make your heart muscles stronger. It also offers other benefits that will improve your health.  Talk to your health care provider before doing any exercises. This information is not intended to replace advice given to you by your health care provider. Make sure you discuss any questions you have with your health care provider. Document Revised: 12/27/2016 Document Reviewed: 12/24/2016 Elsevier Patient Education  [place].

## 2020-02-18 ENCOUNTER — Other Ambulatory Visit: Payer: Medicare Other

## 2020-02-18 ENCOUNTER — Other Ambulatory Visit: Payer: Self-pay | Admitting: Family Medicine

## 2020-02-18 NOTE — Addendum Note (Signed)
Addended by: [name] D on: 02/18/2020 12:55 PM   Modules accepted: Orders

## 2020-02-21 ENCOUNTER — Ambulatory Visit (INDEPENDENT_AMBULATORY_CARE_PROVIDER_SITE_OTHER): Payer: Medicare Other

## 2020-02-21 DIAGNOSIS — Z95810 Presence of automatic (implantable) cardiac defibrillator: Secondary | ICD-10-CM

## 2020-02-21 DIAGNOSIS — I5020 Unspecified systolic (congestive) heart failure: Secondary | ICD-10-CM

## 2020-02-21 NOTE — Progress Notes (Signed)
PREP weekly progress addendum Fun things since last meeting: [place] for: meeting and eating with family and friends Nutrition celebration: short ribs Barriers/struggles: getting/trying to get on [employer]

## 2020-02-21 NOTE — Progress Notes (Signed)
[facility] PREP Weekly Session   Patient Details  Name: Justin [name] MRN: [id number] Date of Birth: 1934-05-30 Age: 86 y.o. PCP: Laurey Morale, MD  Vitals:   02/21/20 1601  Weight: 262 lb (118.8 kg)     [facility] Weekly seesion - 02/21/20 1600      Weekly Session   Topic Discussed --   Portion size, organic produce, circuit workout   Minutes exercised this week 900 minutes    Classes attended to date [place] 02/21/2020, 4:02 PM

## 2020-02-22 ENCOUNTER — Telehealth: Payer: Self-pay | Admitting: Pharmacist

## 2020-02-22 NOTE — Telephone Encounter (Signed)
Patient missed his lab appointment last week. Called pt to reschedule. LVM

## 2020-02-23 ENCOUNTER — Other Ambulatory Visit: Payer: Self-pay

## 2020-02-23 ENCOUNTER — Telehealth: Payer: Self-pay

## 2020-02-23 ENCOUNTER — Other Ambulatory Visit: Payer: Medicare Other | Admitting: *Deleted

## 2020-02-23 DIAGNOSIS — I428 Other cardiomyopathies: Secondary | ICD-10-CM | POA: Diagnosis not present

## 2020-02-23 NOTE — Telephone Encounter (Signed)
Remote ICM transmission received.  Attempted call to patient regarding ICM remote transmission and left detailed essage per DPR.  Advised to return call for any fluid symptoms or questions. Next ICM remote transmission scheduled 03/27/2020.

## 2020-02-23 NOTE — Progress Notes (Signed)
EPIC Encounter for ICM Monitoring  Patient Name: Justin [name] is a 86 y.o. male Date: 02/23/2020 Primary Care Physican: Laurey Morale, MD Primary Cardiologist:[place] Electrophysiologist:Klein Bi-V Pacing:84.8% 5/13/2021Office Weight: 267lbs   Attempted call to patient and unable to reach.  Left detailed message per DPR regarding transmission. Transmission reviewed.   Optivol thoracic impedancenormal.  Prescribed: Furosemide80 mgtaketake 1 tablet once a day alternating with one twice a day.  Recommendations:Left voice mail with ICM number and encouraged to call if experiencing any fluid symptoms.  Follow-up plan: ICM clinic phone appointment on8/09/2019. 91 day device clinic remote transmission 04/10/2020.  EP/Cardiology Office Visits: Recalls for 04/19/2020 with Dr. Angelena Form and 05/07/2020 with Oda Kilts, [place].    Copy of ICM check sent to Dr. [name].   3 month ICM trend: 02/21/2020    1 Year ICM trend:       [name], RN 02/23/2020 11:14 AM

## 2020-02-24 ENCOUNTER — Telehealth: Payer: Self-pay | Admitting: Pharmacist

## 2020-02-24 ENCOUNTER — Telehealth: Payer: Self-pay | Admitting: Cardiovascular Disease

## 2020-02-24 DIAGNOSIS — Z0000 Encounter for general adult medical examination without abnormal findings: Secondary | ICD-10-CM

## 2020-02-24 DIAGNOSIS — I5020 Unspecified systolic (congestive) heart failure: Secondary | ICD-10-CM

## 2020-02-24 LAB — BASIC METABOLIC PANEL
BUN/Creatinine Ratio: 25 — ABNORMAL HIGH (ref 10–24)
BUN: 34 mg/dL — ABNORMAL HIGH (ref 8–27)
CO2: 19 mmol/L — ABNORMAL LOW (ref 20–29)
Calcium: 8.4 mg/dL — ABNORMAL LOW (ref 8.6–10.2)
Chloride: 107 mmol/L — ABNORMAL HIGH (ref 96–106)
Creatinine, Ser: 1.35 mg/dL — ABNORMAL HIGH (ref 0.76–1.27)
GFR calc Af Amer: 56 mL/min/{1.73_m2} — ABNORMAL LOW (ref >59)
GFR calc non Af Amer: 48 mL/min/{1.73_m2} — ABNORMAL LOW (ref >59)
Glucose: 128 mg/dL — ABNORMAL HIGH (ref 65–99)
Potassium: 4.9 mmol/L (ref 3.5–5.2)
Sodium: 138 mmol/L (ref 134–144)

## 2020-02-24 MED ORDER — FUROSEMIDE 80 MG PO TABS: 40.0000 mg | ORAL_TABLET | Freq: Every day | ORAL | 1 refills | Status: DC

## 2020-02-24 NOTE — Telephone Encounter (Signed)
   Pt returning call from [name], transferred call

## 2020-02-24 NOTE — Telephone Encounter (Signed)
Scr bumped with increased Entresto dose. Appears a little too dry. Decrease furosemide to 1/2 the dose (40mg  daily). Decrease KDur to [facility] daily. Recheck BMP in 1 week. BP has been 110-112/61  Spoke with patient. He is in agreement. Will come for lab 7/8.

## 2020-02-28 NOTE — Progress Notes (Signed)
[facility] PREP Weekly Session   Patient Details  Name: Justin [name] MRN: [id number] Date of Birth: 03-19-1934 Age: 86 y.o. PCP: Laurey Morale, MD  There were no vitals filed for this visit.   [facility] Weekly seesion - 02/28/20 1600      Weekly Session   Topic Discussed Finding support    Minutes exercised this week 700 minutes    Classes attended to date 83          Grateful for: memory Nutrition celebration: 4th of July    Barnett Hatter 02/28/2020, 4:16 PM

## 2020-02-29 ENCOUNTER — Ambulatory Visit (INDEPENDENT_AMBULATORY_CARE_PROVIDER_SITE_OTHER): Payer: Medicare Other

## 2020-02-29 DIAGNOSIS — J309 Allergic rhinitis, unspecified: Secondary | ICD-10-CM

## 2020-03-02 ENCOUNTER — Other Ambulatory Visit: Payer: Medicare Other | Admitting: *Deleted

## 2020-03-02 ENCOUNTER — Other Ambulatory Visit: Payer: Self-pay

## 2020-03-02 DIAGNOSIS — I5020 Unspecified systolic (congestive) heart failure: Secondary | ICD-10-CM

## 2020-03-02 LAB — BASIC METABOLIC PANEL
BUN/Creatinine Ratio: 23 (ref 10–24)
BUN: 25 mg/dL (ref 8–27)
CO2: 21 mmol/L (ref 20–29)
Calcium: 8.8 mg/dL (ref 8.6–10.2)
Chloride: 102 mmol/L (ref 96–106)
Creatinine, Ser: 1.07 mg/dL (ref 0.76–1.27)
GFR calc Af Amer: 74 mL/min/{1.73_m2} (ref >59)
GFR calc non Af Amer: 64 mL/min/{1.73_m2} (ref >59)
Glucose: 95 mg/dL (ref 65–99)
Potassium: 4.7 mmol/L (ref 3.5–5.2)
Sodium: 137 mmol/L (ref 134–144)

## 2020-03-06 NOTE — Progress Notes (Signed)
[facility] PREP Weekly Session   Patient Details  Name: BERNERD TERHUNE MRN: [id number] Date of Birth: [birth year] Age: 86 y.o. PCP: Laurey Morale, MD  There were no vitals filed for this visit.   [facility] Weekly seesion - 03/06/20 1500      Weekly Session   Topic Discussed Calorie breakdown    Minutes exercised this week 610 minutes    Classes attended to date 13          Fun things since last meeting: cooking Grateful for: health Nutrition celebration: ice cream Barriers/struggles: heat   Barnett Hatter 03/06/2020, 3:48 PM

## 2020-03-13 NOTE — Progress Notes (Signed)
[facility] PREP Weekly Session   Patient Details  Name: Justin [name] MRN: [id number] Date of Birth: April 11, 1934 Age: 86 y.o. PCP: Laurey Morale, MD  There were no vitals filed for this visit.   [facility] Weekly seesion - 03/13/20 1600      Weekly Session   Topic Discussed Hitting roadblocks    Minutes exercised this week 600 minutes    Classes attended to date 67         Fun things since last meeting: taxes Grateful for: Lin's help with eyes Nutrition celebration: hot dogs    Barnett Hatter 03/13/2020, 4:23 PM

## 2020-03-21 ENCOUNTER — Encounter: Payer: Self-pay | Admitting: Family Medicine

## 2020-03-21 ENCOUNTER — Other Ambulatory Visit: Payer: Self-pay

## 2020-03-21 ENCOUNTER — Ambulatory Visit (INDEPENDENT_AMBULATORY_CARE_PROVIDER_SITE_OTHER): Payer: Medicare Other | Admitting: Family Medicine

## 2020-03-21 VITALS — BP 108/60 | HR 76 | Temp 97.7°F | Ht 66.0 in | Wt 267.4 lb

## 2020-03-21 DIAGNOSIS — M109 Gout, unspecified: Secondary | ICD-10-CM

## 2020-03-21 DIAGNOSIS — N401 Enlarged prostate with lower urinary tract symptoms: Secondary | ICD-10-CM

## 2020-03-21 DIAGNOSIS — J4520 Mild intermittent asthma, uncomplicated: Secondary | ICD-10-CM

## 2020-03-21 DIAGNOSIS — J387 Other diseases of larynx: Secondary | ICD-10-CM

## 2020-03-21 DIAGNOSIS — M159 Polyosteoarthritis, unspecified: Secondary | ICD-10-CM

## 2020-03-21 DIAGNOSIS — I10 Essential (primary) hypertension: Secondary | ICD-10-CM | POA: Diagnosis not present

## 2020-03-21 DIAGNOSIS — E039 Hypothyroidism, unspecified: Secondary | ICD-10-CM | POA: Diagnosis not present

## 2020-03-21 DIAGNOSIS — N138 Other obstructive and reflux uropathy: Secondary | ICD-10-CM | POA: Diagnosis not present

## 2020-03-21 DIAGNOSIS — E785 Hyperlipidemia, unspecified: Secondary | ICD-10-CM | POA: Diagnosis not present

## 2020-03-21 DIAGNOSIS — R739 Hyperglycemia, unspecified: Secondary | ICD-10-CM | POA: Diagnosis not present

## 2020-03-21 DIAGNOSIS — I2510 Atherosclerotic heart disease of native coronary artery without angina pectoris: Secondary | ICD-10-CM | POA: Diagnosis not present

## 2020-03-21 DIAGNOSIS — I5020 Unspecified systolic (congestive) heart failure: Secondary | ICD-10-CM | POA: Diagnosis not present

## 2020-03-21 DIAGNOSIS — R600 Localized edema: Secondary | ICD-10-CM

## 2020-03-21 DIAGNOSIS — M8949 Other hypertrophic osteoarthropathy, multiple sites: Secondary | ICD-10-CM

## 2020-03-21 MED ORDER — DABIGATRAN ETEXILATE MESYLATE 150 MG PO CAPS: ORAL_CAPSULE | ORAL | 3 refills | Status: DC

## 2020-03-21 NOTE — Progress Notes (Signed)
Subjective:    Patient ID: Justin [name], male    DOB: 08-12-1934, 86 y.o.   MRN: [id number]  HPI Here to follow up on issues. He feels well in general, although his eyesight is failing. He can use his computer because he has a large screen, but he can longer read books or newspapers. His chronic cough has stopped and he sthinks this is because he decreased his use of antihistamines. His BP is stable. He plans to take a cruise to [place] and then to the [place] in March.    Review of Systems  Constitutional: Negative.   HENT: Negative.   Eyes: Positive for visual disturbance.  Respiratory: Negative.   Cardiovascular: Negative.   Gastrointestinal: Negative.   Genitourinary: Negative.   Musculoskeletal: Negative.   Skin: Negative.   Neurological: Negative.   Psychiatric/Behavioral: Negative.        Objective:   Physical Exam Constitutional:      General: He is not in acute distress.    Appearance: He is well-developed. He is not diaphoretic.  HENT:     Head: Normocephalic and atraumatic.     Right Ear: External ear normal.     Left Ear: External ear normal.     Nose: Nose normal.     Mouth/Throat:     Pharynx: No oropharyngeal exudate.  Eyes:     General: No scleral icterus.       Right eye: No discharge.        Left eye: No discharge.     Conjunctiva/sclera: Conjunctivae normal.     Pupils: Pupils are equal, round, and reactive to light.  Neck:     Thyroid: No thyromegaly.     Vascular: No JVD.     Trachea: No tracheal deviation.  Cardiovascular:     Rate and Rhythm: Normal rate and regular rhythm.     Heart sounds: Normal heart sounds. No murmur heard.  No friction rub. No gallop.   Pulmonary:     Effort: Pulmonary effort is normal. No respiratory distress.     Breath sounds: Normal breath sounds. No wheezing or rales.  Chest:     Chest wall: No tenderness.  Abdominal:     General: Bowel sounds are normal. There is no distension.     Palpations: Abdomen  is soft. There is no mass.     Tenderness: There is no abdominal tenderness. There is no guarding or rebound.  Genitourinary:    Penis: Normal. No tenderness.      Testes: Normal.  Musculoskeletal:        General: No tenderness. Normal range of motion.     Cervical back: Neck supple.  Lymphadenopathy:     Cervical: No cervical adenopathy.  Skin:    General: Skin is warm and dry.     Coloration: Skin is not pale.     Findings: No erythema or rash.  Neurological:     Mental Status: He is alert and oriented to person, place, and time.     Cranial Nerves: No cranial nerve deficit.     Motor: No abnormal muscle tone.     Coordination: Coordination normal.     Deep Tendon Reflexes: Reflexes are normal and symmetric. Reflexes normal.  Psychiatric:        Behavior: Behavior normal.        Thought Content: Thought content normal.        Judgment: Judgment normal.  Assessment & Plan:  He seems to be doing well. His HTN and CHF are stable. His asthma is stable. We will get fasting labs to check lipids, etc.  [name], MD

## 2020-03-22 LAB — CBC WITH DIFFERENTIAL/PLATELET
Absolute Monocytes: 1030 cells/uL — ABNORMAL HIGH (ref 200–950)
Basophils Absolute: 82 cells/uL (ref 0–200)
Basophils Relative: 0.8 %
Eosinophils Absolute: 258 cells/uL (ref 15–500)
Eosinophils Relative: 2.5 %
HCT: 43.5 % (ref 38.5–50.0)
Hemoglobin: 14.1 g/dL (ref 13.2–17.1)
Lymphs Abs: 2451 cells/uL (ref 850–3900)
MCH: 28.2 pg (ref 27.0–33.0)
MCHC: 32.4 g/dL (ref 32.0–36.0)
MCV: 87 fL (ref 80.0–100.0)
MPV: 12.5 fL (ref 7.5–12.5)
Monocytes Relative: 10 %
Neutro Abs: 6479 cells/uL (ref 1500–7800)
Neutrophils Relative %: 62.9 %
Platelets: 201 10*3/uL (ref 140–400)
RBC: 5 10*6/uL (ref 4.20–5.80)
RDW: 15 % (ref 11.0–15.0)
Total Lymphocyte: 23.8 %
WBC: 10.3 10*3/uL (ref 3.8–10.8)

## 2020-03-22 LAB — T4, FREE: Free T4: 1 ng/dL (ref 0.8–1.8)

## 2020-03-22 LAB — HEPATIC FUNCTION PANEL
AG Ratio: 1.2 (calc) (ref 1.0–2.5)
ALT: 18 U/L (ref 9–46)
AST: 21 U/L (ref 10–35)
Albumin: 3.6 g/dL (ref 3.6–5.1)
Alkaline phosphatase (APISO): 73 U/L (ref 35–144)
Bilirubin, Direct: 0.2 mg/dL (ref 0.0–0.2)
Globulin: 2.9 g/dL (calc) (ref 1.9–3.7)
Indirect Bilirubin: 0.7 mg/dL (calc) (ref 0.2–1.2)
Total Bilirubin: 0.9 mg/dL (ref 0.2–1.2)
Total Protein: 6.5 g/dL (ref 6.1–8.1)

## 2020-03-22 LAB — TSH: TSH: 3.55 mIU/L (ref 0.40–4.50)

## 2020-03-22 LAB — T3, FREE: T3, Free: 2.9 pg/mL (ref 2.3–4.2)

## 2020-03-22 LAB — URIC ACID: Uric Acid, Serum: 8.4 mg/dL — ABNORMAL HIGH (ref 4.0–8.0)

## 2020-03-22 LAB — BASIC METABOLIC PANEL
BUN/Creatinine Ratio: 24 (calc) — ABNORMAL HIGH (ref 6–22)
BUN: 30 mg/dL — ABNORMAL HIGH (ref 7–25)
CO2: 26 mmol/L (ref 20–32)
Calcium: 8.5 mg/dL — ABNORMAL LOW (ref 8.6–10.3)
Chloride: 101 mmol/L (ref 98–110)
Creat: 1.27 mg/dL — ABNORMAL HIGH (ref 0.70–1.11)
Glucose, Bld: 107 mg/dL — ABNORMAL HIGH (ref 65–99)
Potassium: 4.7 mmol/L (ref 3.5–5.3)
Sodium: 135 mmol/L (ref 135–146)

## 2020-03-22 LAB — LIPID PANEL
Cholesterol: 125 mg/dL (ref <200)
HDL: 30 mg/dL — ABNORMAL LOW (ref >=40)
LDL Cholesterol (Calc): 68 mg/dL (calc)
Non-HDL Cholesterol (Calc): 95 mg/dL (calc) (ref <130)
Total CHOL/HDL Ratio: 4.2 (calc) (ref <5.0)
Triglycerides: 206 mg/dL — ABNORMAL HIGH (ref <150)

## 2020-03-22 LAB — HEMOGLOBIN A1C
Hgb A1c MFr Bld: 6.1 % of total Hgb — ABNORMAL HIGH (ref <5.7)
Mean Plasma Glucose: 128 (calc)
eAG (mmol/L): 7.1 (calc)

## 2020-03-22 LAB — PSA: PSA: 3.5 ng/mL (ref <=4.0)

## 2020-03-22 NOTE — Progress Notes (Signed)
[facility] Report   Patient Details  Name: Justin [name] MRN: [id number] Date of Birth: 12/11/1933 Age: 86 y.o. PCP: Laurey Morale, MD  Vitals:   03/22/20 1611  BP: (!) 110/58  Pulse: 60  SpO2: 97%  Weight: (!) 269 lb 12.8 oz (122.4 kg)      [facility] Eval - 03/22/20 1600      Measurement   Neck measurement 18 Inches    Waist Circumference 55 inches      Mobility and Daily Activities   I find it easy to walk up or down two or more flights of stairs. 1    I have no trouble taking out the trash. 4    I do housework such as vacuuming and dusting on my own without difficulty. 2    I can easily lift a gallon of milk (8lbs). 4    I can easily walk a mile. 4    I have no trouble reaching into high cupboards or reaching down to pick up something from the floor. 3    I do not have trouble doing out-door work such as Armed forces logistics/support/administrative officer, raking leaves, or gardening. 1      Mobility and Daily Activities   I feel younger than my age. 4    I feel independent. 4    I feel energetic. 3    I live an active life.  4    I feel strong. 3    I feel healthy. 4    I feel active as other people my age. 4      How fit and strong are you.   Fit and Strong Total Score 45          Past Medical History:  Diagnosis Date  . Age-related macular degeneration, dry, both eyes   . Allergic    "24/7; 365 days/year; I'm allergic to pollens, dust, all southern grasses/trees, mold, mildue, cats, dogs" (01/07/2017)  . Anal fissure   . Asthma    sees [name]   . Benign prostatic hypertrophy    (sees Dr. Denman George  . CAD (coronary artery disease)    a. s/p CABG 2007. b. Cath 08/2016 - 4/4 patent grafts.  . Carotid bruit    carotid u/s 10/10: 0.39% bilaterally  . Chronic combined systolic and diastolic CHF (congestive heart failure) ([place])   . Complication of anesthesia 1980s   "w/anal cyst OR, he gave me a saddle block then put a narcotic in spinal cord; had a severe reaction  to that" (01/07/2017)  . Congestive heart failure (CHF) ([place])   . ED (erectile dysfunction)   . Family history of adverse reaction to anesthesia    "daughter wakes up during OR" (01/07/2017)  . GERD (gastroesophageal reflux disease)   . Gout   . HTN (hypertension)   . Hx of colonic polyps    (sees Dr. Henrene Pastor)  . Hyperlipidemia   . Hypothyroidism   . Moderate to severe aortic stenosis    a. s/p TAVR 02/2017.  [name] Myocardial infarction ([place]) ~ 2000  . Obesity   . Osteoarthritis    "was in my knees, hands" (01/07/2017 )  . PAF (paroxysmal atrial fibrillation) ([place])    a. documented post TAVR.  [name] Precancerous skin lesion    (sees Dr. Allyson Sabal)  . Prostate cancer ([place]) dx'd ~ 2014  . 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 ANUS SURGERY     "opened it back up cause it wouldn't heal; wound up w/a fissure" (01/07/2017)  . BIV ICD INSERTION CRT-D N/A 05/02/2017   Procedure: BIV ICD INSERTION CRT-D;  Surgeon: Deboraha Sprang, MD;  Location: [facility];  Service: Cardiovascular;  Laterality: N/A;  . CARDIAC CATHETERIZATION  10/29/2005  . CARDIAC CATHETERIZATION N/A 08/27/2016   Procedure: Right/Left Heart Cath and Coronary/Graft Angiography;  Surgeon: Burnell Blanks, MD;  Location: [facility];  Service: Cardiovascular;  Laterality: N/A;  . CATARACT EXTRACTION W/ INTRAOCULAR LENS  IMPLANT, BILATERAL Bilateral   . CATARACT EXTRACTION, BILATERAL  2012  . COLONOSCOPY  06/30/2008   no repeats needed   . COLONOSCOPY     had 3 or 4 in the past   . CORONARY ARTERY BYPASS GRAFT  2007   "CABG X4"  . CYST EXCISION PERINEAL  1980s  . HAMMER TOE SURGERY Bilateral   . JOINT REPLACEMENT    . KNEE ARTHROPLASTY  07/30/2011   Procedure: COMPUTER ASSISTED TOTAL KNEE ARTHROPLASTY;  Surgeon:  Meredith Pel;  Location: [facility];  Service: Orthopedics;  Laterality: Left;  left total knee arthroplasty  . MASTECTOMY SUBCUTANEOUS Bilateral   . MULTIPLE TOOTH EXTRACTIONS    . ORIF FINGER / THUMB FRACTURE Right ~ 1980   "repair of thumb injury"  . PROSTATE BIOPSY    . REPLACEMENT TOTAL KNEE BILATERAL Bilateral 2012  . TEE WITHOUT CARDIOVERSION N/A 03/04/2017   Procedure: TRANSESOPHAGEAL ECHOCARDIOGRAM (TEE);  Surgeon: Burnell Blanks, MD;  Location: [facility];  Service: Open Heart Surgery;  Laterality: N/A;  . TOTAL KNEE REVISION Right 05/18/2019   Procedure: RIGHT PATELLA REVISION/REMOVAL;  Surgeon: Meredith Pel, MD;  Location: [facility];  Service: Orthopedics;  Laterality: Right;  . TRANSCATHETER AORTIC VALVE REPLACEMENT, TRANSFEMORAL N/A 03/04/2017   Procedure: TRANSCATHETER AORTIC VALVE REPLACEMENT, TRANSFEMORAL;  Surgeon: Burnell Blanks, MD;  Location: [facility];  Service: Open Heart Surgery;  Laterality: N/A;   Social History   Tobacco Use  Smoking Status Former Smoker  . Packs/day: 3.50  . Years: 13.00  . Pack years: 45.50  . Types: Cigarettes  . Quit date: 1963  . Years since quitting: 58.6  Smokeless Tobacco Never Used     Plans on continuing exercise to maintain ambulation so they can continue to travel  Barnett Hatter 03/22/2020, 4:13 PM

## 2020-03-27 ENCOUNTER — Ambulatory Visit (INDEPENDENT_AMBULATORY_CARE_PROVIDER_SITE_OTHER): Payer: Medicare Other

## 2020-03-27 ENCOUNTER — Telehealth: Payer: Self-pay

## 2020-03-27 DIAGNOSIS — I5020 Unspecified systolic (congestive) heart failure: Secondary | ICD-10-CM

## 2020-03-27 DIAGNOSIS — Z95810 Presence of automatic (implantable) cardiac defibrillator: Secondary | ICD-10-CM

## 2020-03-27 NOTE — Progress Notes (Addendum)
00   Chronic Care Management Pharmacy Assistant   Name: KAO CONRY  MRN: [id number] DOB: 04/01/1934  Reason for Encounter: Follow-up   PCP : Laurey Morale, MD  Allergies:   Allergies  Allergen Reactions  . Peanut-Containing Drug Products Anaphylaxis  . Sulfonamide Derivatives Anaphylaxis  . Amlodipine Swelling    Swelling in ankles  . Eliquis [Apixaban] Other (See Comments)    Back/hip pain  . Lisinopril Cough    cough  . Xarelto [Rivaroxaban] Other (See Comments)    Back/hip pain    Medications: Outpatient Encounter Medications as of 03/27/2020  Medication Sig  . aspirin 81 MG tablet Take 1 tablet (81 mg total) by mouth daily.  [name] atorvastatin (LIPITOR) 20 MG tablet TAKE 1 TABLET ONCE DAILY.  [name] Azelastine HCl 0.15 % SOLN Place 2 sprays into the nose in the morning and at bedtime.  . benzonatate (TESSALON) 200 MG capsule TAKE 1 CAPSULE EVERY 8 HOURS AS NEEDED FOR COUGH.  . cetirizine (ZYRTEC) 10 MG tablet Take 10 mg by mouth daily.  . chlorpheniramine (CHLOR-TRIMETON) 4 MG tablet Take 2 tablets (8 mg total) by mouth at bedtime.  . dabigatran (PRADAXA) 150 MG CAPS capsule TAKE (1) CAPSULE TWICE DAILY.  . diphenhydrAMINE (DIPHENHIST) 25 MG tablet Take 25 mg by mouth at bedtime.   [name] EPINEPHrine 0.3 mg/0.3 mL IJ SOAJ injection Inject 0.3 mLs (0.3 mg total) into the muscle as needed for anaphylaxis.  . famotidine (PEPCID) 10 MG tablet Take 20 mg by mouth at bedtime.   . fluticasone (FLONASE) 50 MCG/ACT nasal spray Place 2 sprays into both nostrils daily.  . furosemide (LASIX) 80 MG tablet Take 0.5 tablets (40 mg total) by mouth daily.  . hydrOXYzine (ATARAX/VISTARIL) 10 MG tablet Take 10 mg by mouth at bedtime.   . isosorbide mononitrate (IMDUR) 30 MG 24 hr tablet TAKE 1 TABLET ONCE DAILY.  [name] Ketotifen Fumarate (ZADITOR OP) Apply 1 drop to eye daily as needed (itchy eyes).   [name] levothyroxine (SYNTHROID) 100 MCG tablet Take 1 tablet (100 mcg total) by mouth daily.  .  metoprolol succinate (TOPROL-XL) 50 MG 24 hr tablet Take 1.5 tablets (75 mg total) by mouth daily. Take with or immediately following a meal.  . montelukast (SINGULAIR) 10 MG tablet TAKE 1 TABLET ONCE DAILY IN THE EVENING.  . Multiple Vitamins-Minerals (PRESERVISION AREDS PO) Take 2 tablets by mouth every morning.  [name] omeprazole (PRILOSEC) 20 MG capsule TAKE (1) CAPSULE TWICE DAILY.  [name] potassium chloride SA (KLOR-CON) 20 MEQ tablet Take 1 tablet by mouth daily  . sacubitril-valsartan (ENTRESTO) 97-103 MG Take 1 tablet by mouth 2 (two) times daily.  [name] spironolactone (ALDACTONE) 25 MG tablet Take 1 tablet (25 mg total) by mouth daily.  [name] triamcinolone cream (KENALOG) 0.1 % Apply 1 application topically daily as needed for dry skin.  . valACYclovir (VALTREX) 500 MG tablet Take 500 mg by mouth 2 (two) times daily as needed (cold sores).   No facility-administered encounter medications on file as of 03/27/2020.    Current Diagnosis: Patient Active Problem List   Diagnosis Date Noted  . Loosening of knee joint prosthesis ([place]) 05/18/2019  . Cough 04/30/2019  . Gastroesophageal reflux disease 04/30/2019  . S/P ICD (internal cardiac defibrillator) procedure 05/03/2017  . NICM (nonischemic cardiomyopathy) ([place]) 05/02/2017  . 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 Severe aortic valve stenosis 03/04/2017  . Hypokalemia due to loss of potassium with diuresis 01/10/2017  . Aortic stenosis  01/10/2017  . Systolic heart failure ([place]) 01/10/2017  . Acute on chronic diastolic CHF (congestive heart failure) ([place]) 01/07/2017  . Hypothyroidism 12/23/2016  . Prostate cancer ([place])   . 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 Edema of both legs 09/01/2014  . Asthma, intermittent 09/01/2014  . Aortic valve disorders 01/01/2011  . Coronary artery disease involving native coronary artery of native heart without angina pectoris   . GOUT 09/12/2009  . CAD, AUTOLOGOUS BYPASS GRAFT  05/10/2009  . MURMUR 05/08/2009  . BRUIT 05/08/2009  . MUSCLE CRAMPS 12/12/2008  . KNEE SPRAIN 09/21/2008  . Osteoarthritis 07/07/2008  . CONTUSION, HIP 05/13/2008  . BPH with urinary obstruction 02/01/2008  . LOW BACK PAIN SYNDROME 02/01/2008  . TESTOSTERONE DEFICIENCY 07/03/2007  . Hyperlipidemia 07/03/2007  . Essential hypertension 07/03/2007  . MYOCARDIAL INFARCTION, HX OF 07/03/2007  . Allergic rhinitis 07/03/2007  . COLONIC POLYPS, HX OF 07/03/2007  . 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    Goals Addressed   None     Have you seen any other providers since your last visit? Yes Any changes in your medications or health? no Any side effects from any medications? no Do you have an symptoms or problems not managed by your medications? no Any concerns about your health right now? Yes  Patient feels like he is retaining water. Has your provider asked that you check blood pressure, blood sugar, or follow special diet at home? Yes  Patient checks blood pressure at home.  Patient home cooks 90-95% of the time with no salt added.   Do you get any type of exercise on a regular basis? Yes  Patient goes to [facility] and walks for 30-45 three times a week.  Can you think of a goal you would like to reach for your health?   Patient would like to lose 12-15 pounds before he goes on a cruise.  Do you have any problems getting your medications? no Is there anything that you would like to discuss during the appointment?   Patient states he would like to discuss loosing weight, retaining fluid and his previous lab work.  Please bring medications and supplements to appointment  [place] Pharmacist Assistant [phone]

## 2020-03-28 ENCOUNTER — Other Ambulatory Visit: Payer: Self-pay

## 2020-03-28 ENCOUNTER — Ambulatory Visit: Payer: Medicare Other

## 2020-03-28 ENCOUNTER — Other Ambulatory Visit: Payer: Self-pay | Admitting: Family Medicine

## 2020-03-28 ENCOUNTER — Telehealth: Payer: Self-pay | Admitting: Pharmacist

## 2020-03-28 ENCOUNTER — Ambulatory Visit (INDEPENDENT_AMBULATORY_CARE_PROVIDER_SITE_OTHER): Payer: Medicare Other

## 2020-03-28 DIAGNOSIS — E039 Hypothyroidism, unspecified: Secondary | ICD-10-CM

## 2020-03-28 DIAGNOSIS — J309 Allergic rhinitis, unspecified: Secondary | ICD-10-CM

## 2020-03-28 DIAGNOSIS — I5033 Acute on chronic diastolic (congestive) heart failure: Secondary | ICD-10-CM

## 2020-03-28 DIAGNOSIS — I5020 Unspecified systolic (congestive) heart failure: Secondary | ICD-10-CM

## 2020-03-28 NOTE — Chronic Care Management (AMB) (Signed)
Chronic Care Management Pharmacy  Name: Justin [name]  MRN: [id number] DOB: [birth year]   Chief Complaint/ HPI  Justin [name],  86 y.o. , male presents for their Follow-Up CCM visit with the clinical pharmacist In office.   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 He is married and his wife helps with driving since he mentions that his mascular degeneration is getting worse. He brought all of his medications today with him on ziplock bags labeled AM, AM/PM, PM.   PCP : Laurey Morale, MD  Their chronic conditions include: HTN, HLD, combined systolic and diastolic CHF, CAD s/p CABG   Office Visits: 03/21/20: Patient presented to Dr. Sarajane Jews for follow-up. A1c improved.  01/26/20 OV - Chronic cough. Irritable larynx syndrome. Use OTC cough syrups as needed.  Consult Visit: 02/24/20: Patient presented to Dr. [name], [facility] for follow-up. Scr increased, furosemide decreased to 40 mg daily and potassium decreased to 20 mEq daily.  01/11/20 OV (Maccia, Cardio) - f/u acute on chronic diastolic HF. Patient controlled. On target dose of spironolactone. BMP today due to increased dose of spironolactone. GERD, hypothyroidism, allergic rhinitis  Medications: Outpatient Encounter Medications as of 03/28/2020  Medication Sig  . aspirin 81 MG tablet Take 1 tablet (81 mg total) by mouth daily.  [name] atorvastatin (LIPITOR) 20 MG tablet TAKE 1 TABLET ONCE DAILY.  [name] Azelastine HCl 0.15 % SOLN Place 2 sprays into the nose in the morning and at bedtime.  . benzonatate (TESSALON) 200 MG capsule TAKE 1 CAPSULE EVERY 8 HOURS AS NEEDED FOR COUGH.  . cetirizine (ZYRTEC) 10 MG tablet Take 10 mg by mouth daily.  . chlorpheniramine (CHLOR-TRIMETON) 4 MG tablet Take 2 tablets (8 mg total) by mouth at bedtime.  . dabigatran (PRADAXA) 150 MG CAPS capsule TAKE (1) CAPSULE TWICE DAILY.  . diphenhydrAMINE (DIPHENHIST) 25 MG tablet Take 25 mg by mouth at  bedtime.   [name] EPINEPHrine 0.3 mg/0.3 mL IJ SOAJ injection Inject 0.3 mLs (0.3 mg total) into the muscle as needed for anaphylaxis.  . famotidine (PEPCID) 10 MG tablet Take 20 mg by mouth at bedtime.   . fluticasone (FLONASE) 50 MCG/ACT nasal spray Place 2 sprays into both nostrils daily.  . furosemide (LASIX) 80 MG tablet Take 0.5 tablets (40 mg total) by mouth daily.  . hydrOXYzine (ATARAX/VISTARIL) 10 MG tablet Take 10 mg by mouth at bedtime.   . isosorbide mononitrate (IMDUR) 30 MG 24 hr tablet TAKE 1 TABLET ONCE DAILY.  [name] Ketotifen Fumarate (ZADITOR OP) Apply 1 drop to eye daily as needed (itchy eyes).   [name] levothyroxine (SYNTHROID) 100 MCG tablet Take 1 tablet (100 mcg total) by mouth daily.  . metoprolol succinate (TOPROL-XL) 50 MG 24 hr tablet Take 1.5 tablets (75 mg total) by mouth daily. Take with or immediately following a meal.  . montelukast (SINGULAIR) 10 MG tablet TAKE 1 TABLET ONCE DAILY IN THE EVENING.  . Multiple Vitamins-Minerals (PRESERVISION AREDS PO) Take 2 tablets by mouth every morning.  [name] omeprazole (PRILOSEC) 20 MG capsule TAKE (1) CAPSULE TWICE DAILY. (Patient taking differently: Take 20 mg by mouth daily. )  . potassium chloride SA (KLOR-CON) 20 MEQ tablet Take 1 tablet by mouth daily  . sacubitril-valsartan (ENTRESTO) 97-103 MG Take 1 tablet by mouth 2 (two) times daily.  [name] spironolactone (ALDACTONE) 25 MG tablet Take 1 tablet (25 mg total) by mouth daily.  [name] triamcinolone cream (KENALOG) 0.1 % Apply  1 application topically daily as needed for dry skin.  . valACYclovir (VALTREX) 500 MG tablet Take 500 mg by mouth 2 (two) times daily as needed (cold sores).   No facility-administered encounter medications on file as of 03/28/2020.     Transportation Needs: No Transportation Needs  . Lack of Transportation (Medical): No  . Lack of Transportation (Non-Medical): No     Financial Resource Strain: Low Risk   . Difficulty of Paying Living Expenses: Not hard at all     Current Diagnosis/Assessment:  Goals Addressed            This Visit's Progress   . Chronic Care Management       CARE PLAN ENTRY  Current Barriers:  . Chronic Disease Management support, education, and care coordination needs related to Hypertension, Hyperlipidemia, and Heart Failure   Hypertension BP Readings from Last 3 Encounters:  01/11/20 (!) 116/50  01/06/20 (!) 98/58  12/28/19 110/60   . Pharmacist Clinical Goal(s): o Over the next 60 days, patient will work with PharmD and providers to maintain BP goal <130/80 . Current regimen:  . Furosemide 80 mg 1/2 tablet daily . Isosorbide mononitrate 30 mg 1 tablet daily . Metoprolol succinate 50 mg 1.5 tablet daily with or immediately following a meal . Spironolactone 25 mg 1 tablet daily . Interventions: o Discussed low salt diet and exercising as tolerated extensively . Patient self care activities - Over the next 60 days, patient will: o Check BP once a day, document, and provide at future appointments o Ensure daily salt intake < 2300 mg/day  Hyperlipidemia Lab Results  Component Value Date/Time   LDLCALC 49 03/11/2019 08:58 AM   LDLCALC 73 08/11/2018 07:35 AM   . Pharmacist Clinical Goal(s): o Over the next 60 days, patient will work with PharmD and providers to maintain LDL goal < 100 . Current regimen:  o Atorvastatin 20 mg 1 tablet daily . Interventions: o Discussed low cholesterol diet and exercising as tolerated extensively . Patient self care activities - Over the next 60 days, patient will: o Continue with low salt intake and exercise as tolerated  Heart Failure . Pharmacist Clinical Goal(s) o Over the next 60 days, patient will work with PharmD and providers to minimize symptoms of weight gain and fluid overload  . Current regimen:   Furosemide 80 mg 1 tablet daily  Isosorbide mononitrate 30 mg 1 tablet daily  Metoprolol succinate 50 mg 1.5 tablet by mouth daily with or immediately following  a meal  Entresto 97-103 mg 1 tablet twice daily  Spironolactone 25 mg 1 tablet daily Interventions: o Dicussed with patient the benefits of low salt diet and exercise o Counseled to weigh daily and monitor for 2-3 lbs gain in 24 hrs or 5 lbs in a week. . Patient self care activities - Over the next 60 days, patient will: o Continue to monitor weight and take medication as prescribed o Continue using salt alternatives and maintain low salt diet  Medication management . Pharmacist Clinical Goal(s): o Over the next 60 days, patient will work with PharmD and providers to maintain optimal medication adherence . Current pharmacy: [facility] . Interventions o Comprehensive medication review performed. o Continue current medication management strategy . Patient self care activities - Over the next 60 days, patient will: o Focus on medication adherence by using weekly pill organizers o Take medications as prescribed o Report any questions or concerns to PharmD and/or provider(s)  Heart Failure   Type: Combined Systolic and Diastolic  Last ejection fraction: 30-35% (04/2019) NYHA Class: III (marked limitation of activity) AHA HF Stage: C (Heart disease and symptoms present)  Patient has failed these meds in past: none Patient is currently controlled on the following medications:   Furosemide 40 mg 1 tablet daily  Isosorbide mononitrate 30 mg 1 tablet daily  Metoprolol succinate 50 mg 1.5 tablets by mouth daily with or immediately following a meal  Entresto 97-103 mg 1 tablet twice daily  Spironolactone 25 mg 1 tablet daily  Followed by Dr. Angelena Form. Patient feels that since decreasing his furosemide he has been holding onto more fluid, stating he feels his weight has trended up ~12 pounds.   Follows a low-salt diet, is interested in trying to lose weight in time for his cruise in April 2022. Discussed in detail importance of low salt intake, increasing vegetable  intake, and monitoring portions of food to help achieve target goal weight.   Also discussed importance of 150 minutes of physical activity weekly, which patient is close to achieving through formal exercise/ chores around house.  Plan  Continue current medications  Will reach out to Cardiology team to discuss potential increase in furosemide dose.   Hypertension   Office blood pressures are  BP Readings from Last 3 Encounters:  03/22/20 (!) 110/58  03/21/20 (!) 108/60  01/11/20 (!) 116/50   CMP Latest Ref Rng & Units 03/21/2020 03/02/2020 02/23/2020  Glucose 65 - 99 mg/dL 107(H) 95 128(H)  BUN 7 - 25 mg/dL 30(H) 25 34(H)  Creatinine 0.70 - 1.11 mg/dL 1.27(H) 1.07 1.35(H)  Sodium 135 - 146 mmol/L 135 137 138  Potassium 3.5 - 5.3 mmol/L 4.7 4.7 4.9  Chloride 98 - 110 mmol/L 101 102 107(H)  CO2 20 - 32 mmol/L 26 21 19(L)  Calcium 8.6 - 10.3 mg/dL 8.5(L) 8.8 8.4(L)  Total Protein 6.1 - 8.1 g/dL 6.5 - -  Total Bilirubin 0.2 - 1.2 mg/dL 0.9 - -  Alkaline Phos 39 - 117 U/L - - -  AST 10 - 35 U/L 21 - -  ALT 9 - 46 U/L 18 - -   Patient has failed these meds in the past: losartan Patient is currently controlled on the following medications:  . Furosemide 40 mg 1 tablet daily . Isosorbide mononitrate 30 mg 1 tablet daily . Metoprolol succinate 50 mg 1.5 tablet daily with or immediately following a meal . Spironolactone 25 mg 1 tablet daily  Patient checks BP at home daily  Patient home BP readings are ranging: 110/70  Patient BP stable within goal < 130/80. Patient has been eating well and reports using salt alternatives and herbs to flavor food. He mentions preparing his food more often. He reports going out more often and stay active. He mentions that he was still able to walk in [place] for 3-4 hrs with minimal back pain and knee pain.   Plan  Continue current medications   Hyperlipidemia   Lipid Panel     Component Value Date/Time   CHOL 125 03/21/2020 0943   CHOL 171  08/11/2018 0735   TRIG 206 (H) 03/21/2020 0943   TRIG 143 06/30/2006 1132   HDL 30 (L) 03/21/2020 0943   HDL 34 (L) 08/11/2018 0735   CHOLHDL 4.2 03/21/2020 0943   VLDL 34.0 03/11/2019 0858   LDLCALC 68 03/21/2020 0943   LABVLDL 64 (H) 08/11/2018 0735     The ASCVD Risk score Mikey Bussing DC Brooke Bonito., et  al., 2013) failed to calculate for the following reasons:   The 2013 ASCVD risk score is only valid for ages 38 to 38   The patient has a prior MI or stroke diagnosis   Patient has failed these meds in past: none Patient is currently controlled on the following medications:  . Atorvastatin 20 mg 1 tablet daily  Patient remains mindful of his diet and exercise. Denies myalgias associated with atorvastatin.  Plan  Continue current medications  CAD s/p CABG   Patient has failed these meds in past: none Patient is currently controlled on the following medications:  . Aspirin 81 mg 1 tablet daily . Pradaxa 150 mg 1 capsule twice daily  Followed by Angelena Form. Patient has been on pradaxa for a long time. He mentions the only thing is the cost which he pays $130 a month. Told patient that other alternatives would be around that cost. Patient understands and he mentions he could still afford the medication with problem. Denies severe bleeding and bruising with pradaxa.   Plan  Continue current medications  GERD   Patient has failed these meds in past: none  Patient is currently controlled on the following medications:  . Pepcid Complete (Famotidine + Calcium + Magnesium) 2 tablets at bedtime . Omeprazole 20 mg 1 capsule daily  Patient reports acid reflux and heart burn stable and has not had any problems since decreasing omeprazole to once daily.   Plan  Continue current medications  Hypothyroidism   Lab Results  Component Value Date/Time   TSH 3.55 03/21/2020 09:43 AM   TSH 3.69 05/31/2019 08:34 AM   FREET4 1.0 03/21/2020 09:43 AM   FREET4 0.89 05/31/2019 08:34 AM    Patient  has failed these meds in past: none Patient is currently controlled on the following medications:  . Levothyroxine 100 mcg 1 tablet daily  Patient reports feeling well and energized. No signs of fatigue, except for symptoms of CHF.  Plan  Continue current medications   Allergic Rhinitis   Patient has failed these meds in past: none Patient is currently controlled on the following medications:  . Azelastine 0.15% 2 sprays into the nose in the morning and at bedtime . Cetirizine 10 mg 1 tablet daily . Flonase 2 sprays into both nostrils daily . Montelukast 10 mg 1 tablet in the evening . Benedaryl 25 mg 1 tablet at bedtime . Benzonatate 200 mg 1 capsule every 8 hrs as needed for cough  Plan  Continue current medications    OTCs/Health Maintenance   Patient is currently controlled on the following medications: . Triamcinolone 0.1% cream once daily PRN for dry skin . Valtrex 500 mg 1 tablet twice daily as needed for cold sores  Plan  Continue current medications   Vaccines   Reviewed and discussed patient's vaccination history.    Immunization History  Administered Date(s) Administered  . Fluad Quad(high Dose 65+) 05/06/2019  . Hep A / Hep B 04/06/2012, 10/07/2012  . Hepatitis B 05/07/2012  . Influenza Split 06/28/2013  . Influenza Whole 06/05/2007, 05/13/2008, 06/06/2009, 05/30/2010  . Influenza, High Dose Seasonal PF 05/15/2018  . Influenza,inj,Quad PF,6+ Mos 05/20/2016  . Influenza-Unspecified 05/25/2014, 07/08/2015, 05/06/2017  . PFIZER SARS-COV-2 Vaccination 09/28/2019, 10/12/2019  . Pneumococcal Conjugate-13 09/18/2015  . Pneumococcal Polysaccharide-23 05/13/2008, 05/25/2014  . Td 05/30/2010  . Zoster 10/21/2007  . Zoster Recombinat (Shingrix) 05/18/2018, 10/16/2018    Plan  Patient updated with vaccines.  Medication Management   Pharmacy/Benefits: [facility] Adherence:  Uses weekly organizers. Wife helps her with her medications since  he has difficulty seeing. Pt endorses 100% compliance  Patient goes to [facility] in [facility] and has been their customer for years. He mentions that he takes care of him especially when he goes out of the [place] and needs more day supply of his prescriptions. He mentions that they know him well and he takes care of him greatly.   Plan  Continue current medication management strategy  Follow up: 6 month office visit  [place] Pharmacist [facility] [place]

## 2020-03-28 NOTE — Telephone Encounter (Signed)
Thanks

## 2020-03-28 NOTE — Telephone Encounter (Signed)
Received a message from Doristine Section that patient has been retaining some fluid since decreasing furosemide to 40mg  daily. I called and spoke with patient who states that his weight it up to about 266-267 (normally 260-261) up about 5lb. He took furosemide 40mg  BID for 2 days last week, helped some. I have asked patient to take furosemide 80mg  daily for 3 days, then decrease back to 40mg  daily (extra 40mg  prn). I will follow up with patient on Monday. Will potentially have him come in for BMP and BNP next week.  Patient instructed to weigh himself everyday.

## 2020-03-29 NOTE — Progress Notes (Signed)
EPIC Encounter for ICM Monitoring  Patient Name: Justin [name] is a 86 y.o. male Date: 03/29/2020 Primary Care Physican: Laurey Morale, MD Primary Cardiologist:McAlhany Electrophysiologist:Klein Bi-V Pacing:84.0% 8/4/2021Weight: 754HKG   Spoke with patient.  He reports he called Dr Camillia Herter office 8/6 due weight increased from 260-261 to 266-267 over the last week since Furosemide dosage was decreased from 80 mg to 40 mg daily.  He was instructed to take 80 mg x 3 days which he started 8/3 and after that take 40 mg daily  Optivol thoracic impedancenormal.  Prescribed: Furosemide80 mgtaketake 0.5 tablet (40 mg total) by mouth daily.  Recommendations: Discussed limiting salt intake to 2000 mg daily and check food labels for salt amount.  He seldom eats restaurants foods.    Follow-up plan: ICM clinic phone appointment on8/04/2020 to recheck fluid levels. 91 day device clinic remote transmission8/16/2021.  EP/Cardiology Office Visits: Recalls for 04/19/2020 with Dr. Angelena Form and 05/07/2020 with Oda Kilts, [place].    Copy of ICM check sent to Dr. [name].   3 month ICM trend: 03/27/2020    1 Year ICM trend:       [name], RN 03/29/2020 1:45 PM

## 2020-03-31 DIAGNOSIS — J3089 Other allergic rhinitis: Secondary | ICD-10-CM | POA: Diagnosis not present

## 2020-03-31 DIAGNOSIS — H353221 Exudative age-related macular degeneration, left eye, with active choroidal neovascularization: Secondary | ICD-10-CM | POA: Diagnosis not present

## 2020-04-03 ENCOUNTER — Ambulatory Visit (INDEPENDENT_AMBULATORY_CARE_PROVIDER_SITE_OTHER): Payer: Medicare Other

## 2020-04-03 DIAGNOSIS — I5020 Unspecified systolic (congestive) heart failure: Secondary | ICD-10-CM

## 2020-04-03 DIAGNOSIS — Z95810 Presence of automatic (implantable) cardiac defibrillator: Secondary | ICD-10-CM

## 2020-04-03 NOTE — Telephone Encounter (Signed)
Spoke with patient. He took 80mg  of furosemide for 3 days (Aug 4,5,6). States his weight is back down. He also took extra K while on the higher dose. He is now back on furosemide 40mg  daily. I have instructed him that if he notices that he has gained 3 or more pounds overnight or 5lb in a week, he is to take 1.5 tablets of furosemide (60mg ) once. If this is ineffective he can take 80mg  the next day. He is to call us if this is ineffective. Patient will come in tomorrow for BMET.

## 2020-04-03 NOTE — Addendum Note (Signed)
Addended by: [name] D on: 04/03/2020 01:47 PM   Modules accepted: Orders

## 2020-04-04 ENCOUNTER — Other Ambulatory Visit: Payer: Medicare Other

## 2020-04-04 ENCOUNTER — Other Ambulatory Visit: Payer: Self-pay

## 2020-04-04 DIAGNOSIS — I5020 Unspecified systolic (congestive) heart failure: Secondary | ICD-10-CM | POA: Diagnosis not present

## 2020-04-04 LAB — BASIC METABOLIC PANEL
BUN/Creatinine Ratio: 23 (ref 10–24)
BUN: 27 mg/dL (ref 8–27)
CO2: 20 mmol/L (ref 20–29)
Calcium: 8.6 mg/dL (ref 8.6–10.2)
Chloride: 100 mmol/L (ref 96–106)
Creatinine, Ser: 1.18 mg/dL (ref 0.76–1.27)
GFR calc Af Amer: 66 mL/min/{1.73_m2} (ref >59)
GFR calc non Af Amer: 57 mL/min/{1.73_m2} — ABNORMAL LOW (ref >59)
Glucose: 104 mg/dL — ABNORMAL HIGH (ref 65–99)
Potassium: 5.1 mmol/L (ref 3.5–5.2)
Sodium: 134 mmol/L (ref 134–144)

## 2020-04-05 ENCOUNTER — Telehealth: Payer: Self-pay

## 2020-04-05 NOTE — Telephone Encounter (Signed)
LMOVM for pt to send missed ICM transmission.  

## 2020-04-05 NOTE — Telephone Encounter (Addendum)
Pt called PharmD line, states he thinks he just sent in transmission. Forwarding to appropriate staff for follow up.

## 2020-04-05 NOTE — Telephone Encounter (Signed)
Transmission received.

## 2020-04-06 ENCOUNTER — Telehealth: Payer: Self-pay | Admitting: Pharmacist

## 2020-04-06 NOTE — Telephone Encounter (Signed)
Labs are stable after increasing diuretic and K for 3 days, then decreasing back (see previous phone note). Continue furosemide 40mg  daily. May take any extra 20-40mg  as needed for weight gain/swelling. Continue KCL 20 MEQ daily. Patient made aware of results and is in agreement with plan.

## 2020-04-07 NOTE — Progress Notes (Signed)
EPIC Encounter for ICM Monitoring  Patient Name: Justin [name] is a 86 y.o. male Date: 04/07/2020 Primary Care Physican: Laurey Morale, MD Primary Cardiologist:McAlhany Electrophysiologist:Klein Bi-V Pacing:84.6% 8/4/2021Weight: 920FEO   Spoke with patient.  He says he feels great and thinks the fluid has resolved.  Optivol thoracic impedancereturned to normal after taking 3 days of extra Furosemide.  Prescribed:   Furosemide80 mgtaketake 0.5 tablet (40 mg total) by mouth daily. Per [name] pharmacist, he may take extra 20-40 mg as needed for weight gain/swelling.  KLC 20 mEq take 1 tablet daily  Recommendations: No changes and encouraged to call if experiencing any fluid symptoms.    Follow-up plan: ICM clinic phone appointment on9/13/2021. 91 day device clinic remote transmission8/16/2021.  EP/Cardiology Office Visits:Recalls for 04/21/2020 with Dr.McAlhany and 05/07/2020 with [name].   Copy of ICM check sent to [place].   3 month ICM trend: 04/05/2020    1 Year ICM trend:       [name], RN 04/07/2020 4:39 PM

## 2020-04-10 ENCOUNTER — Ambulatory Visit (INDEPENDENT_AMBULATORY_CARE_PROVIDER_SITE_OTHER): Payer: Medicare Other | Admitting: *Deleted

## 2020-04-10 DIAGNOSIS — J301 Allergic rhinitis due to pollen: Secondary | ICD-10-CM | POA: Diagnosis not present

## 2020-04-10 DIAGNOSIS — I5020 Unspecified systolic (congestive) heart failure: Secondary | ICD-10-CM | POA: Diagnosis not present

## 2020-04-10 LAB — CUP PACEART REMOTE DEVICE CHECK
Battery Remaining Longevity: 25 mo
Brady Statistic AP VP Percent: 9.17 %
Brady Statistic AP VS Percent: 0.27 %
Brady Statistic AS VP Percent: 84.03 %
Brady Statistic AS VS Percent: 6.53 %
Brady Statistic RA Percent Paced: 8.98 %
Brady Statistic RV Percent Paced: 79.39 %
Date Time Interrogation Session: 20210815042506
HighPow Impedance: 82 Ohm
Implantable Lead Implant Date: 20180907
Implantable Lead Implant Date: 20180907
Implantable Lead Implant Date: 20180907
Implantable Lead Location: 753858
Implantable Lead Location: 753859
Implantable Lead Location: 753860
Implantable Lead Model: 4396
Implantable Lead Model: 5076
Implantable Pulse Generator Implant Date: 20180907
Lead Channel Impedance Value: 342 Ohm
Lead Channel Impedance Value: 399 Ohm
Lead Channel Impedance Value: 456 Ohm
Lead Channel Impedance Value: 494 Ohm
Lead Channel Impedance Value: 570 Ohm
Lead Channel Impedance Value: 855 Ohm
Lead Channel Pacing Threshold Amplitude: 0.625 V
Lead Channel Pacing Threshold Amplitude: 0.75 V
Lead Channel Pacing Threshold Amplitude: 1.125 V
Lead Channel Pacing Threshold Pulse Width: 0.4 ms
Lead Channel Pacing Threshold Pulse Width: 0.4 ms
Lead Channel Pacing Threshold Pulse Width: 1 ms
Lead Channel Sensing Intrinsic Amplitude: 1.875 mV
Lead Channel Sensing Intrinsic Amplitude: 1.875 mV
Lead Channel Sensing Intrinsic Amplitude: 11.625 mV
Lead Channel Sensing Intrinsic Amplitude: 11.625 mV
Lead Channel Setting Pacing Amplitude: 1.5 V
Lead Channel Setting Pacing Amplitude: 2 V
Lead Channel Setting Pacing Amplitude: 2.5 V
Lead Channel Setting Pacing Pulse Width: 0.4 ms
Lead Channel Setting Pacing Pulse Width: 1 ms
Lead Channel Setting Sensing Sensitivity: 0.3 mV

## 2020-04-11 ENCOUNTER — Ambulatory Visit (INDEPENDENT_AMBULATORY_CARE_PROVIDER_SITE_OTHER): Payer: Medicare Other

## 2020-04-11 DIAGNOSIS — J309 Allergic rhinitis, unspecified: Secondary | ICD-10-CM

## 2020-04-11 NOTE — Progress Notes (Signed)
Remote ICD transmission.   

## 2020-04-21 ENCOUNTER — Encounter: Payer: Self-pay | Admitting: Cardiovascular Disease

## 2020-04-21 ENCOUNTER — Other Ambulatory Visit: Payer: Self-pay

## 2020-04-21 ENCOUNTER — Ambulatory Visit: Payer: Medicare Other | Admitting: Cardiovascular Disease

## 2020-04-21 VITALS — BP 106/60 | HR 61 | Ht 66.0 in | Wt 269.0 lb

## 2020-04-21 DIAGNOSIS — Z95810 Presence of automatic (implantable) cardiac defibrillator: Secondary | ICD-10-CM | POA: Diagnosis not present

## 2020-04-21 DIAGNOSIS — I2510 Atherosclerotic heart disease of native coronary artery without angina pectoris: Secondary | ICD-10-CM

## 2020-04-21 DIAGNOSIS — I350 Nonrheumatic aortic (valve) stenosis: Secondary | ICD-10-CM | POA: Diagnosis not present

## 2020-04-21 DIAGNOSIS — I255 Ischemic cardiomyopathy: Secondary | ICD-10-CM | POA: Diagnosis not present

## 2020-04-21 DIAGNOSIS — I10 Essential (primary) hypertension: Secondary | ICD-10-CM | POA: Diagnosis not present

## 2020-04-21 NOTE — Patient Instructions (Signed)
Medication Instructions:  No changes  *If you need a refill on your cardiac medications before your next appointment, please call your pharmacy*   Lab Work: none If you have labs (blood work) drawn today and your tests are completely normal, you will receive your results only by: . MyChart Message (if you have MyChart) OR . A paper copy in the mail If you have any lab test that is abnormal or we need to change your treatment, we will call you to review the results.   Testing/Procedures: none   Follow-Up: At [facility], you and your health needs are our priority.  As part of our continuing mission to provide you with exceptional heart care, we have created designated Provider Care Teams.  These Care Teams include your primary Cardiologist (physician) and Advanced Practice Providers (APPs -  Physician Assistants and Nurse Practitioners) who all work together to provide you with the care you need, when you need it.  Your next appointment:   6 month(s)  The format for your next appointment:   In Person  Provider:   You may see Christopher McAlhany, MD or one of the following Advanced Practice Providers on your designated Care Team:    Dayna Dunn, PA-C  Michele Lenze, PA-C    Other Instructions   

## 2020-04-21 NOTE — Progress Notes (Signed)
Chief Complaint  Patient presents with  . Follow-up    CAD   History of Present Illness: 86 yo male with history of HTN, HLD, severe aortic stenosis s/p TAVR, PAF, chronic diastolic CHF and CAD s/p 4V CABG, syncope, junctional bradycardia, PVCS, non-sustained VT and LBBB s/p ICD implantationwho is here today for cardiac follow up. He is known to have CAD s/p 4V CABG in March 2007 (LIMA to LAD, SVG to Diagonal, SVG to OM, SVG to RCA). Last cardiac cath in January 2018 with 4/4 patent bypass grafts. He had progression of his aortic stenosis with worsened dyspnea and acute on chronic CHF in the spring of 2018. Echo May 2018 with mildly reduced LV systolic function, OVZC=58-85%. His aortic stenosis was severe. He underwent TAVR on 03/04/17 with a 29 mm Edwards Sapien 3 valve from the right transfemoral approach. He developed atrial fibrillation following his procedure and was placed on IV amiodarone with conversion to sinus rhythm. Follow up visit in our office 03/19/17 and pt noted to have irregular rhythm with intraventricular conduction delay and was felt by our EP team to represent 2:1 conduction of competing fast and slow pathways. Cardiac monitor with sinus rhythm with periods of junctional rhythm, frequent PVCs, frequent PACs and runs of a wide complex tachycardia. ICD was implanted on 05/03/17. Echo September 2020 with LVEF=35-40%. He has since been changed from Losartan to [facility].   He is here today for follow up. The patient denies any chest pain, dyspnea, palpitations, lower extremity edema, orthopnea, PND, dizziness, near syncope or syncope.    Primary Care Physician: Laurey Morale, MD  Past Medical History:  Diagnosis Date  . Age-related macular degeneration, dry, both eyes   . Allergic    "24/7; 365 days/year; I'm allergic to pollens, dust, all southern grasses/trees, mold, mildue, cats, dogs" (01/07/2017)  . Anal fissure   . Asthma    sees [name]   . Benign prostatic  hypertrophy    (sees Dr. Denman George  . CAD (coronary artery disease)    a. s/p CABG 2007. b. Cath 08/2016 - 4/4 patent grafts.  . Carotid bruit    carotid u/s 10/10: 0.39% bilaterally  . Chronic combined systolic and diastolic CHF (congestive heart failure) ([place])   . Complication of anesthesia 1980s   "w/anal cyst OR, he gave me a saddle block then put a narcotic in spinal cord; had a severe reaction to that" (01/07/2017)  . Congestive heart failure (CHF) ([place])   . ED (erectile dysfunction)   . Family history of adverse reaction to anesthesia    "daughter wakes up during OR" (01/07/2017)  . GERD (gastroesophageal reflux disease)   . Gout   . HTN (hypertension)   . Hx of colonic polyps    (sees Dr. Henrene Pastor)  . Hyperlipidemia   . Hypothyroidism   . Moderate to severe aortic stenosis    a. s/p TAVR 02/2017.  [name] Myocardial infarction ([place]) ~ 2000  . Obesity   . Osteoarthritis    "was in my knees, hands" (01/07/2017 )  . PAF (paroxysmal atrial fibrillation) ([place])    a. documented post TAVR.  [name] Precancerous skin lesion    (sees Dr. Allyson Sabal)  . Prostate cancer ([place]) dx'd ~ 2014  . 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   29 mm Edwards Sapien 3 transcatheter heart valve placed via percutaneous right transfemoral approach    Past Surgical History:  Procedure Laterality  Date  . ANUS SURGERY     "opened it back up cause it wouldn't heal; wound up w/a fissure" (01/07/2017)  . BIV ICD INSERTION CRT-D N/A 05/02/2017   Procedure: BIV ICD INSERTION CRT-D;  Surgeon: Deboraha Sprang, MD;  Location: [place] [facility];  Service: Cardiovascular;  Laterality: N/A;  . CARDIAC CATHETERIZATION  10/29/2005  . CARDIAC CATHETERIZATION N/A 08/27/2016   Procedure: Right/Left Heart Cath and Coronary/Graft Angiography;  Surgeon: Burnell Blanks, MD;  Location: [place] [facility];  Service: Cardiovascular;  Laterality: N/A;  . CATARACT EXTRACTION W/ INTRAOCULAR LENS   IMPLANT, BILATERAL Bilateral   . CATARACT EXTRACTION, BILATERAL  2012  . COLONOSCOPY  06/30/2008   no repeats needed   . COLONOSCOPY     had 3 or 4 in the past   . CORONARY ARTERY BYPASS GRAFT  2007   "CABG X4"  . CYST EXCISION PERINEAL  1980s  . HAMMER TOE SURGERY Bilateral   . JOINT REPLACEMENT    . KNEE ARTHROPLASTY  07/30/2011   Procedure: COMPUTER ASSISTED TOTAL KNEE ARTHROPLASTY;  Surgeon: Meredith Pel;  Location: [place];  Service: Orthopedics;  Laterality: Left;  left total knee arthroplasty  . MASTECTOMY SUBCUTANEOUS Bilateral   . MULTIPLE TOOTH EXTRACTIONS    . ORIF FINGER / THUMB FRACTURE Right ~ 1980   "repair of thumb injury"  . PROSTATE BIOPSY    . REPLACEMENT TOTAL KNEE BILATERAL Bilateral 2012  . TEE WITHOUT CARDIOVERSION N/A 03/04/2017   Procedure: TRANSESOPHAGEAL ECHOCARDIOGRAM (TEE);  Surgeon: Burnell Blanks, MD;  Location: [place];  Service: Open Heart Surgery;  Laterality: N/A;  . TOTAL KNEE REVISION Right 05/18/2019   Procedure: RIGHT PATELLA REVISION/REMOVAL;  Surgeon: Meredith Pel, MD;  Location: [place];  Service: Orthopedics;  Laterality: Right;  . TRANSCATHETER AORTIC VALVE REPLACEMENT, TRANSFEMORAL N/A 03/04/2017   Procedure: TRANSCATHETER AORTIC VALVE REPLACEMENT, TRANSFEMORAL;  Surgeon: Burnell Blanks, MD;  Location: [place];  Service: Open Heart Surgery;  Laterality: N/A;    Current Outpatient Medications  Medication Sig Dispense Refill  . aspirin 81 MG tablet Take 1 tablet (81 mg total) by mouth daily. 30 tablet 0  . atorvastatin (LIPITOR) 20 MG tablet TAKE 1 TABLET ONCE DAILY. 90 tablet 0  . Azelastine HCl 0.15 % SOLN Place 2 sprays into the nose in the morning and at bedtime. 30 mL 11  . benzonatate (TESSALON) 200 MG capsule TAKE 1 CAPSULE EVERY 8 HOURS AS NEEDED FOR COUGH. 90 capsule 3  . cetirizine (ZYRTEC) 10 MG tablet Take 10 mg by mouth daily.    . chlorpheniramine (CHLOR-TRIMETON) 4 MG tablet Take 2 tablets (8 mg total) by  mouth at bedtime. 60 tablet 12  . dabigatran (PRADAXA) 150 MG CAPS capsule TAKE (1) CAPSULE TWICE DAILY. 180 capsule 3  . diphenhydrAMINE (DIPHENHIST) 25 MG tablet Take 25 mg by mouth at bedtime.     [name] EPINEPHrine 0.3 mg/0.3 mL IJ SOAJ injection Inject 0.3 mLs (0.3 mg total) into the muscle as needed for anaphylaxis. 2 Device 1  . famotidine (PEPCID) 10 MG tablet Take 20 mg by mouth at bedtime.     . fluticasone (FLONASE) 50 MCG/ACT nasal spray Place 2 sprays into both nostrils daily. 16 g 11  . furosemide (LASIX) 80 MG tablet Take 0.5 tablets (40 mg total) by mouth daily. 90 tablet 1  . hydrOXYzine (ATARAX/VISTARIL) 10 MG tablet Take 10 mg by mouth at bedtime.     . isosorbide mononitrate (IMDUR)  30 MG 24 hr tablet TAKE 1 TABLET ONCE DAILY. 90 tablet 0  . Ketotifen Fumarate (ZADITOR OP) Apply 1 drop to eye daily as needed (itchy eyes).     [name] levothyroxine (SYNTHROID) 100 MCG tablet TAKE 1 TABLET ONCE DAILY. 90 tablet 0  . metoprolol succinate (TOPROL-XL) 50 MG 24 hr tablet Take 1.5 tablets (75 mg total) by mouth daily. Take with or immediately following a meal. 135 tablet 3  . montelukast (SINGULAIR) 10 MG tablet TAKE 1 TABLET ONCE DAILY IN THE EVENING. 90 tablet 3  . Multiple Vitamins-Minerals (PRESERVISION AREDS PO) Take 2 tablets by mouth every morning.    [name] omeprazole (PRILOSEC) 20 MG capsule TAKE (1) CAPSULE TWICE DAILY. (Patient taking differently: Take 20 mg by mouth daily. ) 180 capsule 0  . potassium chloride SA (KLOR-CON) 20 MEQ tablet Take 1 tablet by mouth daily 270 tablet 3  . sacubitril-valsartan (ENTRESTO) 97-103 MG Take 1 tablet by mouth 2 (two) times daily. 60 tablet 11  . spironolactone (ALDACTONE) 25 MG tablet Take 1 tablet (25 mg total) by mouth daily. 90 tablet 3  . triamcinolone cream (KENALOG) 0.1 % Apply 1 application topically daily as needed for dry skin.    . valACYclovir (VALTREX) 500 MG tablet Take 500 mg by mouth 2 (two) times daily as needed (cold sores).     No  current facility-administered medications for this visit.    Allergies  Allergen Reactions  . Peanut-Containing Drug Products Anaphylaxis  . Sulfonamide Derivatives Anaphylaxis  . Amlodipine Swelling    Swelling in ankles  . Eliquis [Apixaban] Other (See Comments)    Back/hip pain  . Lisinopril Cough    cough  . Xarelto [Rivaroxaban] Other (See Comments)    Back/hip pain    Social History   Socioeconomic History  . Marital status: Married    Spouse name: Not on file  . Number of children: 3  . Years of education: Not on file  . Highest education level: Not on file  Occupational History  . Occupation: Clinical biochemist    Comment: builds malls  Tobacco Use  . Smoking status: Former Smoker    Packs/day: 3.50    Years: 13.00    Pack years: 45.50    Types: Cigarettes    Quit date: 1963    Years since quitting: 58.6  . Smokeless tobacco: Never Used  Vaping Use  . Vaping Use: Never used  Substance and Sexual Activity  . Alcohol use: Yes    Alcohol/week: 0.0 standard drinks    Comment: 01/07/2017 "might have 2 beers 2-3 times/month"  . Drug use: No  . Sexual activity: Never  Other Topics Concern  . Not on file  Social History Narrative   FH of CAD, Male 1st degree relative less than age 46.   Social Determinants of Health   Financial Resource Strain: Low Risk   . Difficulty of Paying Living Expenses: Not hard at all  Food Insecurity:   . Worried About Charity fundraiser in the Last Year: Not on file  . Ran Out of Food in the Last Year: Not on file  Transportation Needs: No Transportation Needs  . Lack of Transportation (Medical): No  . Lack of Transportation (Non-Medical): No  Physical Activity:   . Days of Exercise per Week: Not on file  . Minutes of Exercise per Session: Not on file  Stress:   . Feeling of Stress : Not on file  Social Connections:   .  Frequency of Communication with Friends and Family: Not on file  . Frequency of Social Gatherings with  Friends and Family: Not on file  . Attends Religious Services: Not on file  . Active Member of Clubs or Organizations: Not on file  . Attends Archivist Meetings: Not on file  . Marital Status: Not on file  Intimate Partner Violence:   . Fear of Current or Ex-Partner: Not on file  . Emotionally Abused: Not on file  . Physically Abused: Not on file  . Sexually Abused: Not on file    Family History  Problem Relation Age of Onset  . Heart attack Father 67  . Allergic rhinitis Father   . Asthma Father   . Heart failure Mother 54  . Uterine cancer Mother   . Breast cancer Mother   . Colon cancer Neg Hx   . Esophageal cancer Neg Hx     Review of Systems:  As stated in the HPI and otherwise negative.   BP 106/60   Pulse 61   Ht 5\' 6"  (1.676 m)   Wt 269 lb (122 kg)   SpO2 (!) 9%   BMI 43.42 kg/m   Physical Examination: General: Well developed, well nourished, NAD  HEENT: OP clear, mucus membranes moist  SKIN: warm, dry. No rashes. Neuro: No focal deficits  Musculoskeletal: Muscle strength 5/5 all ext  Psychiatric: Mood and affect normal  Neck: No JVD, no carotid bruits, no thyromegaly, no lymphadenopathy.  Lungs:Clear bilaterally, no wheezes, rhonci, crackles Cardiovascular: Regular rate and rhythm. No murmurs, gallops or rubs. Abdomen:Soft. Bowel sounds present. Non-tender.  Extremities: Mild bilateral lower extremity edema. Pulses are 2 + in the bilateral DP/PT.  Echo September 2020:  1. The left ventricle has moderately reduced systolic function, with an ejection fraction of 35-40%. The cavity size was normal. There is moderately increased left ventricular wall thickness. Left ventricular diastolic Doppler parameters are consistent  with impaired relaxation. Indeterminate filling pressures.  2. Severe hypokinesis of the left ventricular, entire anterior wall, anteroseptal wall and apical segment.  3. The right ventricle has moderately reduced systolic  function. The cavity was normal. There is no increase in right ventricular wall thickness.  4. Left atrial size was mildly dilated.  5. Right atrial size was mildly dilated.  6. The mitral valve is abnormal. Mild thickening of the mitral valve leaflet. There is mild to moderate mitral annular calcification present.  7. The tricuspid valve is grossly normal.  8. A 35mm an Wende Crease Sapien bioprosthetic aortic valve (TAVR) valve is present in the aortic position. Procedure Date: 03/13/2019 Normal aortic valve prosthesis.  9. No stenosis of the aortic valve. 10. The aorta is abnormal unless otherwise noted. 11. There is mild dilatation of the ascending aorta measuring 40 mm. 12. When compared to the prior study: 02/06/2018: LVEF 40-45%, TAVR mean gradient 9 mmHg, aorta 3.9 cm.   Cardiac cath 08/27/16: Diagnostic Diagram          EKG:  EKG is not ordered today The ekg ordered today demonstrates    Recent Labs: 03/21/2020: ALT 18; Hemoglobin 14.1; Platelets 201; TSH 3.55 04/04/2020: BUN 27; Creatinine, Ser 1.18; Potassium 5.1; Sodium 134   Lipid Panel    Component Value Date/Time   CHOL 125 03/21/2020 0943   CHOL 171 08/11/2018 0735   TRIG 206 (H) 03/21/2020 0943   TRIG 143 06/30/2006 1132   HDL 30 (L) 03/21/2020 0943   HDL 34 (L) 08/11/2018 3244  CHOLHDL 4.2 03/21/2020 0943   VLDL 34.0 03/11/2019 0858   LDLCALC 68 03/21/2020 0943     Wt Readings from Last 3 Encounters:  04/21/20 269 lb (122 kg)  03/22/20 (!) 269 lb 12.8 oz (122.4 kg)  03/21/20 (!) 267 lb 6.4 oz (121.3 kg)     Other studies Reviewed: Additional studies/ records that were reviewed today include:  Review of the above records demonstrates:    Assessment and Plan:   1. CAD s/p CABG without angina: No chest pain. Continue ASA, statin and beta blocker.      2. Aortic valve stenosis: He is s/p TAVR in July 2018. Continue SBE prophylaxis as indicated. He is on Pradaxa.   3. HTN: BP is well controlled.  Continue current therapy  4. HLD: LDL at goal. Continue statin  5. Chronic systolic and diastolic CHF: No volume overload on exam. Weight is stable. Continue Lasix.   6. Atrial fibrillation, paroxysmal: Sinus today. Continue beta blocker and Pradaxa.   7. Ischemic cardiomyopathy: ICD in place. Will continue Toprol and Entresto  8. Carotid artery disease:  Mild bilateral carotid artery disease by dopplers March 2021.   Current medicines are reviewed at length with the patient today.  The patient does not have concerns regarding medicines.  The following changes have been made:  no change  Labs/ tests ordered today include:   No orders of the defined types were placed in this encounter.  Disposition:  Follow up with me in 6 months.   Signed, [name], MD 04/21/2020 9:58 AM    [facility] [address], [place], [place]  [address] Phone: [phone]; Fax: [phone]

## 2020-04-24 ENCOUNTER — Ambulatory Visit (INDEPENDENT_AMBULATORY_CARE_PROVIDER_SITE_OTHER): Payer: Medicare Other

## 2020-04-24 DIAGNOSIS — J309 Allergic rhinitis, unspecified: Secondary | ICD-10-CM | POA: Diagnosis not present

## 2020-04-27 ENCOUNTER — Ambulatory Visit: Payer: Medicare Other | Admitting: Orthopedic Surgery

## 2020-04-27 ENCOUNTER — Ambulatory Visit: Payer: Self-pay

## 2020-04-27 DIAGNOSIS — M25561 Pain in right knee: Secondary | ICD-10-CM

## 2020-04-29 ENCOUNTER — Encounter: Payer: Self-pay | Admitting: Orthopedic Surgery

## 2020-04-29 NOTE — Progress Notes (Signed)
Office Visit Note   Patient: Justin [name]           Date of Birth: 08/20/1934           MRN: [id number] Visit Date: 04/27/2020 Requested by: Laurey Morale, MD [place],  [place] [id number] PCP: Laurey Morale, MD  Subjective: Chief Complaint  Patient presents with  . Right Knee - Pain    HPI: Justin [name] is a 86 year old patient with right knee pain.  He wants to discuss what he can and cannot do.  In general he is having some mild inferior lateral patellofemoral pain.  He wants to get 15 pounds of weight off.  He is doing [facility] based therapy for the last 12 weeks.  He is able to walk about 1 to 2 miles.  Hurts him when he is going from standing to sitting.  Flat ground walking is okay.              ROS: All systems reviewed are negative as they relate to the chief complaint within the history of present illness.  Patient denies  fevers or chills.   Assessment & Plan: Visit Diagnoses:  1. Right knee pain, unspecified chronicity     Plan: Impression is right knee pain.  May be related to the small ossicle inferiorly around the patella.  No real evidence of patellar implant loosening.  No effusion in the knee.  I would favor observation for now.  I will think he is really doing any harm with his exercise program.  He is not doing anything that would potentially put his extensor mechanism at risk for rupture.  Follow-up as needed.  Follow-Up Instructions: Return if symptoms worsen or fail to improve.   Orders:  Orders Placed This Encounter  Procedures  . XR KNEE 3 VIEW RIGHT   No orders of the defined types were placed in this encounter.     Procedures: No procedures performed   Clinical Data: No additional findings.  Objective: Vital Signs: There were no vitals taken for this visit.  Physical Exam:   Constitutional: Patient appears well-developed HEENT:  Head: Normocephalic Eyes:EOM are normal Neck: Normal range of motion Cardiovascular: Normal  rate Pulmonary/chest: Effort normal Neurologic: Patient is alert Skin: Skin is warm Psychiatric: Patient has normal mood and affect    Ortho Exam: Ortho exam demonstrates mild patellofemoral crepitus bilaterally.  Does have a little bit of point tenderness around the anterior inferior lateral aspect of that patella.  No effusion in either knee.  No patellar apprehension.  No warmth to the knee.  No nerve root tension signs.  No other masses lymphadenopathy or skin changes noted in that right knee region  Specialty Comments:  No specialty comments available.  Imaging: No results found.   PMFS History: Patient Active Problem List   Diagnosis Date Noted  . Loosening of knee joint prosthesis ([place]) 05/18/2019  . Cough 04/30/2019  . Gastroesophageal reflux disease 04/30/2019  . S/P ICD (internal cardiac defibrillator) procedure 05/03/2017  . NICM (nonischemic cardiomyopathy) ([place]) 05/02/2017  . 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 Severe aortic valve stenosis 03/04/2017  . Hypokalemia due to loss of potassium with diuresis 01/10/2017  . Aortic stenosis 01/10/2017  . Systolic heart failure ([place]) 01/10/2017  . Acute on chronic diastolic CHF (congestive heart failure) ([place]) 01/07/2017  . Hypothyroidism 12/23/2016  . Prostate cancer ([place])   . 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 Edema of both legs  09/01/2014  . Asthma, intermittent 09/01/2014  . Aortic valve disorders 01/01/2011  . Coronary artery disease involving native coronary artery of native heart without angina pectoris   . GOUT 09/12/2009  . CAD, AUTOLOGOUS BYPASS GRAFT 05/10/2009  . MURMUR 05/08/2009  . BRUIT 05/08/2009  . MUSCLE CRAMPS 12/12/2008  . KNEE SPRAIN 09/21/2008  . Osteoarthritis 07/07/2008  . CONTUSION, HIP 05/13/2008  . BPH with urinary obstruction 02/01/2008  . LOW BACK PAIN SYNDROME 02/01/2008  . TESTOSTERONE DEFICIENCY 07/03/2007  . Hyperlipidemia 07/03/2007  . Essential  hypertension 07/03/2007  . MYOCARDIAL INFARCTION, HX OF 07/03/2007  . Allergic rhinitis 07/03/2007  . COLONIC POLYPS, HX OF 07/03/2007  . 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   Past Medical History:  Diagnosis Date  . Age-related macular degeneration, dry, both eyes   . Allergic    "24/7; 365 days/year; I'm allergic to pollens, dust, all southern grasses/trees, mold, mildue, cats, dogs" (01/07/2017)  . Anal fissure   . Asthma    sees [name]   . Benign prostatic hypertrophy    (sees Dr. Denman George  . CAD (coronary artery disease)    a. s/p CABG 2007. b. Cath 08/2016 - 4/4 patent grafts.  . Carotid bruit    carotid u/s 10/10: 0.39% bilaterally  . Chronic combined systolic and diastolic CHF (congestive heart failure) ([place])   . Complication of anesthesia 1980s   "w/anal cyst OR, he gave me a saddle block then put a narcotic in spinal cord; had a severe reaction to that" (01/07/2017)  . Congestive heart failure (CHF) ([place])   . ED (erectile dysfunction)   . Family history of adverse reaction to anesthesia    "daughter wakes up during OR" (01/07/2017)  . GERD (gastroesophageal reflux disease)   . Gout   . HTN (hypertension)   . Hx of colonic polyps    (sees Dr. Henrene Pastor)  . Hyperlipidemia   . Hypothyroidism   . Moderate to severe aortic stenosis    a. s/p TAVR 02/2017.  [name] Myocardial infarction ([place]) ~ 2000  . Obesity   . Osteoarthritis    "was in my knees, hands" (01/07/2017 )  . PAF (paroxysmal atrial fibrillation) ([place])    a. documented post TAVR.  [name] Precancerous skin lesion    (sees Dr. Allyson Sabal)  . Prostate cancer ([place]) dx'd ~ 2014  . 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 Heart attack Father 57  . Allergic rhinitis Father   . Asthma Father   . Heart failure Mother 57  . Uterine cancer  Mother   . Breast cancer Mother   . Colon cancer Neg Hx   . Esophageal cancer Neg Hx     Past Surgical History:  Procedure Laterality Date  . ANUS SURGERY     "opened it back up cause it wouldn't heal; wound up w/a fissure" (01/07/2017)  . BIV ICD INSERTION CRT-D N/A 05/02/2017   Procedure: BIV ICD INSERTION CRT-D;  Surgeon: Deboraha Sprang, MD;  Location: [facility];  Service: Cardiovascular;  Laterality: N/A;  . CARDIAC CATHETERIZATION  10/29/2005  . CARDIAC CATHETERIZATION N/A 08/27/2016   Procedure: Right/Left Heart Cath and Coronary/Graft Angiography;  Surgeon: Burnell Blanks, MD;  Location: [facility];  Service: Cardiovascular;  Laterality: N/A;  . CATARACT  EXTRACTION W/ INTRAOCULAR LENS  IMPLANT, BILATERAL Bilateral   . CATARACT EXTRACTION, BILATERAL  2012  . COLONOSCOPY  06/30/2008   no repeats needed   . COLONOSCOPY     had 3 or 4 in the past   . CORONARY ARTERY BYPASS GRAFT  2007   "CABG X4"  . CYST EXCISION PERINEAL  1980s  . HAMMER TOE SURGERY Bilateral   . JOINT REPLACEMENT    . KNEE ARTHROPLASTY  07/30/2011   Procedure: COMPUTER ASSISTED TOTAL KNEE ARTHROPLASTY;  Surgeon: Meredith Pel;  Location: [facility];  Service: Orthopedics;  Laterality: Left;  left total knee arthroplasty  . MASTECTOMY SUBCUTANEOUS Bilateral   . MULTIPLE TOOTH EXTRACTIONS    . ORIF FINGER / THUMB FRACTURE Right ~ 1980   "repair of thumb injury"  . PROSTATE BIOPSY    . REPLACEMENT TOTAL KNEE BILATERAL Bilateral 2012  . TEE WITHOUT CARDIOVERSION N/A 03/04/2017   Procedure: TRANSESOPHAGEAL ECHOCARDIOGRAM (TEE);  Surgeon: Burnell Blanks, MD;  Location: [facility];  Service: Open Heart Surgery;  Laterality: N/A;  . TOTAL KNEE REVISION Right 05/18/2019   Procedure: RIGHT PATELLA REVISION/REMOVAL;  Surgeon: Meredith Pel, MD;  Location: [facility];  Service: Orthopedics;  Laterality: Right;  . TRANSCATHETER AORTIC VALVE REPLACEMENT, TRANSFEMORAL N/A 03/04/2017   Procedure:  TRANSCATHETER AORTIC VALVE REPLACEMENT, TRANSFEMORAL;  Surgeon: Burnell Blanks, MD;  Location: [facility];  Service: Open Heart Surgery;  Laterality: N/A;   Social History   Occupational History  . Occupation: Clinical biochemist    Comment: builds malls  Tobacco Use  . Smoking status: Former Smoker    Packs/day: 3.50    Years: 13.00    Pack years: 45.50    Types: Cigarettes    Quit date: 1963    Years since quitting: 58.7  . Smokeless tobacco: Never Used  Vaping Use  . Vaping Use: Never used  Substance and Sexual Activity  . Alcohol use: Yes    Alcohol/week: 0.0 standard drinks    Comment: 01/07/2017 "might have 2 beers 2-3 times/month"  . Drug use: No  . Sexual activity: Never

## 2020-05-02 ENCOUNTER — Other Ambulatory Visit: Payer: Self-pay | Admitting: Family Medicine

## 2020-05-02 DIAGNOSIS — L821 Other seborrheic keratosis: Secondary | ICD-10-CM | POA: Diagnosis not present

## 2020-05-02 DIAGNOSIS — L853 Xerosis cutis: Secondary | ICD-10-CM | POA: Diagnosis not present

## 2020-05-02 DIAGNOSIS — K130 Diseases of lips: Secondary | ICD-10-CM | POA: Diagnosis not present

## 2020-05-02 DIAGNOSIS — L570 Actinic keratosis: Secondary | ICD-10-CM | POA: Diagnosis not present

## 2020-05-02 DIAGNOSIS — L249 Irritant contact dermatitis, unspecified cause: Secondary | ICD-10-CM | POA: Diagnosis not present

## 2020-05-08 ENCOUNTER — Ambulatory Visit (INDEPENDENT_AMBULATORY_CARE_PROVIDER_SITE_OTHER): Payer: Medicare Other

## 2020-05-08 DIAGNOSIS — I5020 Unspecified systolic (congestive) heart failure: Secondary | ICD-10-CM

## 2020-05-08 DIAGNOSIS — Z95810 Presence of automatic (implantable) cardiac defibrillator: Secondary | ICD-10-CM

## 2020-05-09 ENCOUNTER — Telehealth: Payer: Self-pay | Admitting: Cardiovascular Disease

## 2020-05-09 ENCOUNTER — Telehealth: Payer: Self-pay

## 2020-05-09 DIAGNOSIS — E039 Hypothyroidism, unspecified: Secondary | ICD-10-CM

## 2020-05-09 DIAGNOSIS — I10 Essential (primary) hypertension: Secondary | ICD-10-CM

## 2020-05-09 NOTE — Telephone Encounter (Signed)
Thanks [name].   Justin [name]

## 2020-05-09 NOTE — Progress Notes (Signed)
Chronic Care Management Pharmacy Assistant   Name: Justin [name]  MRN: [id number] DOB: Dec 31, 1933  Reason for Encounter: Medication Review/General Adherence call   PCP : Laurey Morale, MD  Allergies:   Allergies  Allergen Reactions  . Peanut-Containing Drug Products Anaphylaxis  . Sulfonamide Derivatives Anaphylaxis  . Amlodipine Swelling    Swelling in ankles  . Eliquis [Apixaban] Other (See Comments)    Back/hip pain  . Lisinopril Cough    cough  . Xarelto [Rivaroxaban] Other (See Comments)    Back/hip pain    Medications: Outpatient Encounter Medications as of 05/09/2020  Medication Sig  . aspirin 81 MG tablet Take 1 tablet (81 mg total) by mouth daily.  [name] atorvastatin (LIPITOR) 20 MG tablet TAKE 1 TABLET ONCE DAILY.  [name] Azelastine HCl 0.15 % SOLN Place 2 sprays into the nose in the morning and at bedtime.  . benzonatate (TESSALON) 200 MG capsule TAKE 1 CAPSULE EVERY 8 HOURS AS NEEDED FOR COUGH.  . cetirizine (ZYRTEC) 10 MG tablet Take 10 mg by mouth daily.  . chlorpheniramine (CHLOR-TRIMETON) 4 MG tablet Take 2 tablets (8 mg total) by mouth at bedtime.  . dabigatran (PRADAXA) 150 MG CAPS capsule TAKE (1) CAPSULE TWICE DAILY.  . diphenhydrAMINE (DIPHENHIST) 25 MG tablet Take 25 mg by mouth at bedtime.   [name] EPINEPHrine 0.3 mg/0.3 mL IJ SOAJ injection Inject 0.3 mLs (0.3 mg total) into the muscle as needed for anaphylaxis.  . famotidine (PEPCID) 10 MG tablet Take 20 mg by mouth at bedtime.   . fluticasone (FLONASE) 50 MCG/ACT nasal spray Place 2 sprays into both nostrils daily.  . furosemide (LASIX) 80 MG tablet Take 0.5 tablets (40 mg total) by mouth daily.  . hydrOXYzine (ATARAX/VISTARIL) 10 MG tablet Take 10 mg by mouth at bedtime.   . isosorbide mononitrate (IMDUR) 30 MG 24 hr tablet TAKE 1 TABLET ONCE DAILY.  [name] Ketotifen Fumarate (ZADITOR OP) Apply 1 drop to eye daily as needed (itchy eyes).   [name] levothyroxine (SYNTHROID) 100 MCG tablet TAKE 1 TABLET ONCE DAILY.    . metoprolol succinate (TOPROL-XL) 50 MG 24 hr tablet Take 1.5 tablets (75 mg total) by mouth daily. Take with or immediately following a meal.  . montelukast (SINGULAIR) 10 MG tablet TAKE 1 TABLET ONCE DAILY IN THE EVENING.  . Multiple Vitamins-Minerals (PRESERVISION AREDS PO) Take 2 tablets by mouth every morning.  [name] omeprazole (PRILOSEC) 20 MG capsule TAKE (1) CAPSULE TWICE DAILY. (Patient taking differently: Take 20 mg by mouth daily. )  . potassium chloride SA (KLOR-CON) 20 MEQ tablet Take 1 tablet by mouth daily  . sacubitril-valsartan (ENTRESTO) 97-103 MG Take 1 tablet by mouth 2 (two) times daily.  [name] spironolactone (ALDACTONE) 25 MG tablet Take 1 tablet (25 mg total) by mouth daily.  [name] triamcinolone cream (KENALOG) 0.1 % Apply 1 application topically daily as needed for dry skin.  . valACYclovir (VALTREX) 500 MG tablet Take 500 mg by mouth 2 (two) times daily as needed (cold sores).   No facility-administered encounter medications on file as of 05/09/2020.    Current Diagnosis: Patient Active Problem List   Diagnosis Date Noted  . Loosening of knee joint prosthesis ([place]) 05/18/2019  . Cough 04/30/2019  . Gastroesophageal reflux disease 04/30/2019  . S/P ICD (internal cardiac defibrillator) procedure 05/03/2017  . NICM (nonischemic cardiomyopathy) ([place]) 05/02/2017  . 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 Severe aortic valve stenosis 03/04/2017  . Hypokalemia due to loss  of potassium with diuresis 01/10/2017  . Aortic stenosis 01/10/2017  . Systolic heart failure ([place]) 01/10/2017  . Acute on chronic diastolic CHF (congestive heart failure) ([place]) 01/07/2017  . Hypothyroidism 12/23/2016  . Prostate cancer ([place])   . 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 Edema of both legs 09/01/2014  . Asthma, intermittent 09/01/2014  . Aortic valve disorders 01/01/2011  . Coronary artery disease involving native coronary artery of native heart without angina pectoris    . GOUT 09/12/2009  . CAD, AUTOLOGOUS BYPASS GRAFT 05/10/2009  . MURMUR 05/08/2009  . BRUIT 05/08/2009  . MUSCLE CRAMPS 12/12/2008  . KNEE SPRAIN 09/21/2008  . Osteoarthritis 07/07/2008  . CONTUSION, HIP 05/13/2008  . BPH with urinary obstruction 02/01/2008  . LOW BACK PAIN SYNDROME 02/01/2008  . TESTOSTERONE DEFICIENCY 07/03/2007  . Hyperlipidemia 07/03/2007  . Essential hypertension 07/03/2007  . MYOCARDIAL INFARCTION, HX OF 07/03/2007  . Allergic rhinitis 07/03/2007  . COLONIC POLYPS, HX OF 07/03/2007  . 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     Follow-Up:  Pharmacist Review   Called patient and discussed medication adherence with patient.  Patient states that his hands are icy cold, turn pale. His whole body is cold. House [facility] is set at 35 and still cold. Hands go numb when he is walking on the treadmill. He has gas and mild diarrhea that started two weeks ago.  Patient states he mix his medication in his box and thought he arrange his metoprolol to be taking as instructed .Patient reports he has been taking metoprolol BID instead of once daily.He spoke with the pharmacist at cardiology and was made aware to go back to once daily.    Patient denies ED visit since her last CPP follow up.  Patient reports any side effects with her medication. Patient denies any problems with her current pharmacy     [name] Pharmacist Assistant [phone]

## 2020-05-09 NOTE — Telephone Encounter (Signed)
Justin [name] is calling requesting to speak with [name] in regards to what he believes is a medication reaction. He states for the past month he has had cold hands and an upset stomach. [name] reports he was not paying much attention to it at first, until the cold hands turned into his whole body being cold. The body chills have been occurring for the past 7-10 days and the rest the past month. Please advise.

## 2020-05-09 NOTE — Telephone Encounter (Signed)
Called patient to discuss. He states that his hands are icy cold, turn pale. His whole body is cold. House [facility] is set at 6 and still cold. Hands go numb when he is talking on the treadmill. He has gas and mild diarrhea. Potentially Rynauds from metoprolol. After review, it appears pt has been taking metoprolol BID instead of daily. I have asked him to go back to daily to see if his symptoms improve. He will call me Friday to update me. If his symptoms are not improved by Friday, will consider further dose reduction of metoprolol.

## 2020-05-09 NOTE — Telephone Encounter (Signed)
Is he on anything that could cause this?

## 2020-05-10 ENCOUNTER — Ambulatory Visit (INDEPENDENT_AMBULATORY_CARE_PROVIDER_SITE_OTHER): Payer: Medicare Other | Admitting: *Deleted

## 2020-05-10 DIAGNOSIS — J309 Allergic rhinitis, unspecified: Secondary | ICD-10-CM | POA: Diagnosis not present

## 2020-05-10 DIAGNOSIS — H353221 Exudative age-related macular degeneration, left eye, with active choroidal neovascularization: Secondary | ICD-10-CM | POA: Diagnosis not present

## 2020-05-10 NOTE — Progress Notes (Signed)
EPIC Encounter for ICM Monitoring  Patient Name: Justin [name] is a 86 y.o. male Date: 05/10/2020 Primary Care Physican: Laurey Morale, MD Primary Cardiologist:McAlhany Electrophysiologist:Klein Bi-V Pacing:84.4% 8/27/2021Office Weight: 838FMM   Spoke with patient and reports feeling well at this time.  Denies fluid symptoms.    Optivol thoracic impedancesuggesting normal fluid levels.  Prescribed:   Furosemide80 mgtaketake0.5tablet (40 mg total) by mouth daily. Per [name] pharmacist, he may take extra 20-40 mg as needed for weight gain/swelling.  KLC 20 mEq take 1 tablet daily  Spironolactone 25 mg take 1 tablet daily  Recommendations:No changes and encouraged to call if experiencing any fluid symptoms.  Follow-up plan: ICM clinic phone appointment on10/18/2021. 91 day device clinic remote transmission11/16/2021.  EP/Cardiology Office Visits:Recalls for 10/20/2020 with Dr.McAlhany and 05/07/2020 with [name].   Copy of ICM check sent to [place].  3 month ICM trend: 05/08/2020    1 Year ICM trend:       [name], RN 05/10/2020 9:54 AM

## 2020-05-15 ENCOUNTER — Telehealth: Payer: Self-pay | Admitting: Family Medicine

## 2020-05-15 NOTE — Telephone Encounter (Signed)
need a call back  [phone] need to talk to the pharmacist to go over some medication

## 2020-05-17 ENCOUNTER — Other Ambulatory Visit: Payer: Self-pay | Admitting: Cardiovascular Disease

## 2020-05-17 ENCOUNTER — Other Ambulatory Visit: Payer: Self-pay | Admitting: Family Medicine

## 2020-05-17 DIAGNOSIS — Z0000 Encounter for general adult medical examination without abnormal findings: Secondary | ICD-10-CM

## 2020-05-17 MED ORDER — ENTRESTO 97-103 MG PO TABS: 1.0000 | ORAL_TABLET | Freq: Two times a day (BID) | ORAL | 3 refills | Status: DC

## 2020-05-19 ENCOUNTER — Other Ambulatory Visit: Payer: Self-pay | Admitting: Family Medicine

## 2020-05-19 DIAGNOSIS — Z0000 Encounter for general adult medical examination without abnormal findings: Secondary | ICD-10-CM

## 2020-05-22 ENCOUNTER — Ambulatory Visit (INDEPENDENT_AMBULATORY_CARE_PROVIDER_SITE_OTHER): Payer: Medicare Other | Admitting: *Deleted

## 2020-05-22 DIAGNOSIS — J309 Allergic rhinitis, unspecified: Secondary | ICD-10-CM | POA: Diagnosis not present

## 2020-05-22 NOTE — Telephone Encounter (Signed)
OK to refill. cdm 

## 2020-05-23 NOTE — Telephone Encounter (Signed)
Can you please reach out to [name] and set him up for a 60-minute follow-up with me next month?  Thanks, Cristie Hem

## 2020-05-23 NOTE — Telephone Encounter (Signed)
Patient called to ask if he still needs to speak to the pharmacist. He says yes he would like to talk to the pharmacist about his medications and how they interact, because he's been having stomach issues and wants to know if the medications could be the cause, new heart medications. I advised I will send to the pharmacist to call and discuss. He says it can be taken care of over the phone, no need to come into the office.

## 2020-05-24 ENCOUNTER — Telehealth: Payer: Self-pay

## 2020-05-24 NOTE — Progress Notes (Signed)
Spoke to patient to schedule patient appointment with clinical pharmacist.   Patient states he has been having extreme gas for the pass 60 days.Patient states he has been taking his medication as directed without missing a does. Patient states he is unsure if it is a medication side effect or a virus. Patient denies vomiting, nausea and loss of appetite. Patient reports having diarrhea for a few days but not at this time.Patient states his wife had similar symptoms but she is feeling better.Patient states he started taking probiotic 2 days ago with little relief.   Schedule patient telephone  appointment on October  At 10:00 with Junius Argyle the clinical pharmacy  [place] Pharmacist Assistant [phone]

## 2020-05-29 ENCOUNTER — Ambulatory Visit (INDEPENDENT_AMBULATORY_CARE_PROVIDER_SITE_OTHER): Payer: Medicare Other

## 2020-05-29 DIAGNOSIS — J309 Allergic rhinitis, unspecified: Secondary | ICD-10-CM

## 2020-05-31 ENCOUNTER — Telehealth: Payer: Self-pay | Admitting: Cardiovascular Disease

## 2020-05-31 DIAGNOSIS — Z0000 Encounter for general adult medical examination without abnormal findings: Secondary | ICD-10-CM

## 2020-05-31 MED ORDER — POTASSIUM CHLORIDE CRYS ER 20 MEQ PO TBCR: EXTENDED_RELEASE_TABLET | ORAL | 3 refills | Status: DC

## 2020-05-31 MED ORDER — FUROSEMIDE 80 MG PO TABS: 40.0000 mg | ORAL_TABLET | Freq: Every day | ORAL | 3 refills | Status: DC

## 2020-05-31 NOTE — Telephone Encounter (Signed)
Pt's medications were sent to pt's pharmacy as requested. Confirmation received.  

## 2020-05-31 NOTE — Telephone Encounter (Signed)
*  STAT* If patient is at the pharmacy, call can be transferred to refill team.   1. Which medications need to be refilled? (please list name of each medication and dose if known)   furosemide (LASIX) 80 MG tablet  potassium chloride SA (KLOR-CON) 20 MEQ tablet   2. Which pharmacy/location (including street and city if local pharmacy) is medication to be sent to? [place], [place]  3. Do they need a 30 day or 90 day supply? [place]

## 2020-05-31 NOTE — Telephone Encounter (Signed)
Hey, do I need to do something with this? Justin [name]

## 2020-06-05 ENCOUNTER — Ambulatory Visit (INDEPENDENT_AMBULATORY_CARE_PROVIDER_SITE_OTHER): Payer: Medicare Other

## 2020-06-05 DIAGNOSIS — J309 Allergic rhinitis, unspecified: Secondary | ICD-10-CM | POA: Diagnosis not present

## 2020-06-08 ENCOUNTER — Telehealth: Payer: Self-pay

## 2020-06-08 NOTE — Progress Notes (Signed)
Spoke to patient to confirmed patient telephone appointment on 06/09/2020 for CCM at 10:00AM with Junius Argyle the Clinical pharmacist.   Patient verbalized understanding  [place] Pharmacist Assistant [phone]

## 2020-06-09 ENCOUNTER — Telehealth: Payer: Self-pay

## 2020-06-09 ENCOUNTER — Ambulatory Visit: Payer: Medicare Other

## 2020-06-09 DIAGNOSIS — K219 Gastro-esophageal reflux disease without esophagitis: Secondary | ICD-10-CM

## 2020-06-09 DIAGNOSIS — E039 Hypothyroidism, unspecified: Secondary | ICD-10-CM

## 2020-06-09 MED ORDER — OMEPRAZOLE 20 MG PO CPDR: DELAYED_RELEASE_CAPSULE | ORAL | 3 refills | Status: DC

## 2020-06-09 NOTE — Telephone Encounter (Signed)
Omeprazole sent to pharmacy.

## 2020-06-09 NOTE — Chronic Care Management (AMB) (Signed)
Chronic Care Management Pharmacy  Name: Justin [name]  MRN: [id number] DOB: 1933-10-02   Chief Complaint/ HPI  Justin [name],  86 y.o. , male presents for their Follow-Up CCM visit with the clinical pharmacist via telephone.   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 He is married and his wife helps with driving since he mentions that his mascular degeneration is getting worse. He brought all of his medications today with him on ziplock bags labeled AM, AM/PM, PM.   PCP : Laurey Morale, MD  Their chronic conditions include: HTN, HLD, combined systolic and diastolic CHF, CAD s/p CABG   Office Visits: 03/21/20: Patient presented to Dr. Sarajane Jews for follow-up. A1c improved.  01/26/20 OV - Chronic cough. Irritable larynx syndrome. Use OTC cough syrups as needed.  Consult Visit: 02/24/20: Patient presented to Dr. [name], [facility] for follow-up. Scr increased, furosemide decreased to 40 mg daily and potassium decreased to 20 mEq daily.  01/11/20 OV ([place], Cardio) - f/u acute on chronic diastolic HF. Patient controlled. On target dose of spironolactone. BMP today due to increased dose of spironolactone. GERD, hypothyroidism, allergic rhinitis  Medications: Outpatient Encounter Medications as of 06/09/2020  Medication Sig  . aspirin 81 MG tablet Take 1 tablet (81 mg total) by mouth daily.  [name] atorvastatin (LIPITOR) 20 MG tablet TAKE 1 TABLET ONCE DAILY.  [name] Azelastine HCl 0.15 % SOLN Place 2 sprays into the nose in the morning and at bedtime.  . benzonatate (TESSALON) 200 MG capsule TAKE 1 CAPSULE EVERY 8 HOURS AS NEEDED FOR COUGH.  . cetirizine (ZYRTEC) 10 MG tablet Take 10 mg by mouth daily.  . chlorpheniramine (CHLOR-TRIMETON) 4 MG tablet Take 2 tablets (8 mg total) by mouth at bedtime.  . dabigatran (PRADAXA) 150 MG CAPS capsule TAKE (1) CAPSULE TWICE DAILY.  . diphenhydrAMINE (DIPHENHIST) 25 MG tablet Take 25 mg by mouth  at bedtime.   [name] EPINEPHrine 0.3 mg/0.3 mL IJ SOAJ injection Inject 0.3 mLs (0.3 mg total) into the muscle as needed for anaphylaxis.  . famotidine (PEPCID) 10 MG tablet Take 20 mg by mouth at bedtime.   . fluticasone (FLONASE) 50 MCG/ACT nasal spray Place 2 sprays into both nostrils daily.  . furosemide (LASIX) 80 MG tablet Take 0.5 tablets (40 mg total) by mouth daily.  . hydrOXYzine (ATARAX/VISTARIL) 10 MG tablet Take 10 mg by mouth at bedtime.   . isosorbide mononitrate (IMDUR) 30 MG 24 hr tablet TAKE 1 TABLET ONCE DAILY.  [name] Ketotifen Fumarate (ZADITOR OP) Apply 1 drop to eye daily as needed (itchy eyes).   [name] levothyroxine (SYNTHROID) 100 MCG tablet TAKE 1 TABLET ONCE DAILY.  . metoprolol succinate (TOPROL-XL) 50 MG 24 hr tablet Take 1.5 tablets (75 mg total) by mouth daily. Take with or immediately following a meal.  . montelukast (SINGULAIR) 10 MG tablet TAKE 1 TABLET ONCE DAILY IN THE EVENING.  . Multiple Vitamins-Minerals (PRESERVISION AREDS PO) Take 2 tablets by mouth every morning.  [name] omeprazole (PRILOSEC) 20 MG capsule TAKE (1) CAPSULE TWICE DAILY. (Patient taking differently: Take 20 mg by mouth daily. )  . potassium chloride SA (KLOR-CON) 20 MEQ tablet Take 1 tablet by mouth daily  . sacubitril-valsartan (ENTRESTO) 97-103 MG Take 1 tablet by mouth 2 (two) times daily.  [name] spironolactone (ALDACTONE) 25 MG tablet Take 1 tablet (25 mg total) by mouth daily.  [name] triamcinolone cream (KENALOG) 0.1 % Apply 1 application topically daily  as needed for dry skin.  . valACYclovir (VALTREX) 500 MG tablet Take 500 mg by mouth 2 (two) times daily as needed (cold sores).   No facility-administered encounter medications on file as of 06/09/2020.     Transportation Needs: No Transportation Needs  . Lack of Transportation (Medical): No  . Lack of Transportation (Non-Medical): No     Financial Resource Strain: Low Risk   . Difficulty of Paying Living Expenses: Not hard at all    Current  Diagnosis/Assessment:  Goals Addressed   None     Heart Failure   Type: Combined Systolic and Diastolic  Last ejection fraction: 30-35% (04/2019) NYHA Class: III (marked limitation of activity) AHA HF Stage: C (Heart disease and symptoms present)  Patient has failed these meds in past: none Patient is currently controlled on the following medications:    Isosorbide mononitrate 30 mg 1 tablet daily  Furosemide 80 mg 1 tablet daily (alternating between 0.5 and 1 tablet daily)   Metoprolol succinate 50 mg 1.5 tablets by mouth daily with or immediately following a meal  Entresto 97-103 mg 1 tablet twice daily  Spironolactone 25 mg 1 tablet daily  Follows a low-salt diet, is interested in trying to lose weight in time for his cruise in April 2022. Discussed in detail importance of low salt intake, increasing vegetable intake, and monitoring portions of food to help achieve target goal weight.   Also discussed importance of 150 minutes of physical activity weekly  Plan  Continue current medications  Will reach out to Cardiology team to discuss potential increase in furosemide dose.   Hypertension   Office blood pressures are  BP Readings from Last 3 Encounters:  04/21/20 106/60  03/22/20 (!) 110/58  03/21/20 (!) 108/60   CMP Latest Ref Rng & Units 04/04/2020 03/21/2020 03/02/2020  Glucose 65 - 99 mg/dL 104(H) 107(H) 95  BUN 8 - 27 mg/dL 27 30(H) 25  Creatinine 0.76 - 1.27 mg/dL 1.18 1.27(H) 1.07  Sodium 134 - 144 mmol/L 134 135 137  Potassium 3.5 - 5.2 mmol/L 5.1 4.7 4.7  Chloride 96 - 106 mmol/L 100 101 102  CO2 20 - 29 mmol/L 20 26 21   Calcium 8.6 - 10.2 mg/dL 8.6 8.5(L) 8.8  Total Protein 6.1 - 8.1 g/dL - 6.5 -  Total Bilirubin 0.2 - 1.2 mg/dL - 0.9 -  Alkaline Phos 39 - 117 U/L - - -  AST 10 - 35 U/L - 21 -  ALT 9 - 46 U/L - 18 -   Patient has failed these meds in the past: losartan Patient is currently controlled on the following medications:  . Furosemide 80  mg 1 tablet daily (alternating between 0.5 and 1 tablet daily)  . Isosorbide mononitrate 30 mg 1 tablet daily . Metoprolol succinate 50 mg 1.5 tablet daily with or immediately following a meal . Spironolactone 25 mg 1 tablet daily  Patient checks BP at home daily  Patient home BP readings are ranging: 110/70  Plan  Continue current medications   Hyperlipidemia   Lipid Panel     Component Value Date/Time   CHOL 125 03/21/2020 0943   CHOL 171 08/11/2018 0735   TRIG 206 (H) 03/21/2020 0943   TRIG 143 06/30/2006 1132   HDL 30 (L) 03/21/2020 0943   HDL 34 (L) 08/11/2018 0735   CHOLHDL 4.2 03/21/2020 0943   VLDL 34.0 03/11/2019 0858   LDLCALC 68 03/21/2020 0943   LABVLDL 64 (H) 08/11/2018 0350  The ASCVD Risk score Mikey Bussing DC Jr., et al., 2013) failed to calculate for the following reasons:   The 2013 ASCVD risk score is only valid for ages 2 to 49   The patient has a prior MI or stroke diagnosis   Patient has failed these meds in past: none Patient is currently controlled on the following medications:  . Atorvastatin 20 mg 1 tablet daily  Plan  Continue current medications  CAD s/p CABG   Patient has failed these meds in past: none Patient is currently controlled on the following medications:  . Aspirin 81 mg 1 tablet daily . Pradaxa 150 mg 1 capsule twice daily  Plan  Continue current medications  GERD   Patient has failed these meds in past: none  Patient is currently controlled on the following medications:  . Pepcid Complete (Famotidine + Calcium + Magnesium) 2 tablets at bedtime . Omeprazole 20 mg 1 capsule daily . Probiotic daily  Patient reports significant gas and bloating for the past 3-4 weeks. Tried a probiotic, unsure if helped.   Recommend increasing to omprazole to twice daily for 4 weeks  Recommend continuing probiotic daily for 4 weeks.   Plan  Continue current medications  Hypothyroidism   Lab Results  Component Value Date/Time    TSH 3.55 03/21/2020 09:43 AM   TSH 3.69 05/31/2019 08:34 AM   FREET4 1.0 03/21/2020 09:43 AM   FREET4 0.89 05/31/2019 08:34 AM    Patient has failed these meds in past: none Patient is currently controlled on the following medications:  . Levothyroxine 100 mcg 1 tablet daily  Patient reports feeling well and energized. No signs of fatigue, except for symptoms of CHF.  Plan  Continue current medications   Allergic Rhinitis   Patient has failed these meds in past: none Patient is currently controlled on the following medications:  . Azelastine 0.15% 2 sprays into the nose in the morning and at bedtime . Cetirizine 10 mg 1 tablet daily . Flonase 2 sprays into both nostrils daily . Montelukast 10 mg 1 tablet in the evening . Benedaryl 25 mg 1 tablet at bedtime . Benzonatate 200 mg 1 capsule every 8 hrs as needed for cough  Plan  Continue current medications    OTCs/Health Maintenance   Patient is currently controlled on the following medications: . Triamcinolone 0.1% cream once daily PRN for dry skin . Valtrex 500 mg 1 tablet twice daily as needed for cold sores  Plan  Continue current medications   Vaccines   Reviewed and discussed patient's vaccination history.    Immunization History  Administered Date(s) Administered  . Fluad Quad(high Dose 65+) 05/06/2019  . Hep A / Hep B 04/06/2012, 10/07/2012  . Hepatitis B 05/07/2012  . Influenza Split 06/28/2013  . Influenza Whole 06/05/2007, 05/13/2008, 06/06/2009, 05/30/2010  . Influenza, High Dose Seasonal PF 05/15/2018  . Influenza,inj,Quad PF,6+ Mos 05/20/2016  . Influenza-Unspecified 05/25/2014, 07/08/2015, 05/06/2017  . PFIZER SARS-COV-2 Vaccination 09/28/2019, 10/12/2019  . Pneumococcal Conjugate-13 09/18/2015  . Pneumococcal Polysaccharide-23 05/13/2008, 05/25/2014  . Td 05/30/2010  . Zoster 10/21/2007  . Zoster Recombinat (Shingrix) 05/18/2018, 10/16/2018   Medication Management   Pharmacy/Benefits:  [place] [facility] Adherence: Uses weekly organizers. Wife helps him with his medications since he has difficulty seeing. Pt endorses 100% compliance  Patient goes to [facility] in [facility] and has been their customer for years. He mentions that he takes care of him especially when [place] and needs more day supply of his prescriptions. He mentions that they know him well and he takes care of him greatly.   Plan  Continue current medication management strategy  Follow up: 6 month office visit  [place] Pharmacist [facility] [place]

## 2020-06-09 NOTE — Telephone Encounter (Signed)
-----   Message from Germaine Pomfret, [facility] sent at 06/09/2020 10:41 AM EDT ----- Regarding: Refill Request Good Morning Lattie Haw,  I spoke with Mr. Klug today and he was requesting that Dr. Sarajane Jews send in a refill of his pantoprazole to [facility]. Could you put this in for me when you get a chance?   Thanks, [place] Clinical Pharmacist [facility] [place]

## 2020-06-14 ENCOUNTER — Ambulatory Visit (INDEPENDENT_AMBULATORY_CARE_PROVIDER_SITE_OTHER): Payer: Medicare Other | Admitting: *Deleted

## 2020-06-14 DIAGNOSIS — J309 Allergic rhinitis, unspecified: Secondary | ICD-10-CM

## 2020-06-15 ENCOUNTER — Telehealth: Payer: Self-pay

## 2020-06-15 NOTE — Telephone Encounter (Signed)
Lmovm for pt to send missed transmission.

## 2020-06-16 ENCOUNTER — Other Ambulatory Visit: Payer: Self-pay | Admitting: Pulmonary Disease

## 2020-06-16 ENCOUNTER — Telehealth: Payer: Self-pay

## 2020-06-16 DIAGNOSIS — R059 Cough, unspecified: Secondary | ICD-10-CM

## 2020-06-16 NOTE — Telephone Encounter (Signed)
Patient called in stating that he is having some ear pain( popping) in the right ear. He was seen and sent in some antibiotics that he says worked then. He wants to know if the Dr will prescribe it again for him    "Eustachian tube dysfunction, treat with a Medrol dose pack.  [name], MD"   Please call and advise

## 2020-06-16 NOTE — Progress Notes (Signed)
No ICM remote transmission received for 06/12/2020 and next ICM transmission scheduled for 06/26/2020.

## 2020-06-19 ENCOUNTER — Ambulatory Visit (INDEPENDENT_AMBULATORY_CARE_PROVIDER_SITE_OTHER): Payer: Medicare Other

## 2020-06-19 DIAGNOSIS — J309 Allergic rhinitis, unspecified: Secondary | ICD-10-CM | POA: Diagnosis not present

## 2020-06-19 NOTE — Telephone Encounter (Signed)
Set up an in person OV for tomorrow

## 2020-06-19 NOTE — Telephone Encounter (Signed)
Patient called and advised of Dr. Barbie Banner recommendation, he verbalized understanding. Appointment scheduled for tomorrow at 1415.

## 2020-06-19 NOTE — Telephone Encounter (Signed)
Patient called to discuss his ear pain, He says it's popping sensation when he chews only in the right ear. He says he just finished a 5 day course of antibiotics, unsure of the name, for a tooth abscess. He says if he needs to take anymore antibiotics, he will do whatever Dr. Sarajane Jews advises. I asked did he want to come into the office so Dr. Sarajane Jews can look in the ear. He says he doesn't think that's necessary, but if that's what Dr. Sarajane Jews wants, then he will. Otherwise he says to send in the medication to [facility]. He says he was called in prednisone back in April for the same thing. I advised I will send this to Dr. Sarajane Jews and call back with his recommendation.

## 2020-06-20 ENCOUNTER — Ambulatory Visit (INDEPENDENT_AMBULATORY_CARE_PROVIDER_SITE_OTHER): Payer: Medicare Other | Admitting: Family Medicine

## 2020-06-20 ENCOUNTER — Encounter: Payer: Self-pay | Admitting: Family Medicine

## 2020-06-20 ENCOUNTER — Other Ambulatory Visit: Payer: Self-pay

## 2020-06-20 VITALS — BP 114/60 | HR 58 | Temp 97.8°F | Ht 66.0 in | Wt 270.8 lb

## 2020-06-20 DIAGNOSIS — T17320A Food in larynx causing asphyxiation, initial encounter: Secondary | ICD-10-CM

## 2020-06-20 DIAGNOSIS — K219 Gastro-esophageal reflux disease without esophagitis: Secondary | ICD-10-CM

## 2020-06-20 DIAGNOSIS — M26651 Arthropathy of right temporomandibular joint: Secondary | ICD-10-CM

## 2020-06-20 MED ORDER — METHYLPREDNISOLONE 4 MG PO TBPK: ORAL_TABLET | ORAL | 0 refills | Status: DC

## 2020-06-20 NOTE — Progress Notes (Signed)
   Subjective:    Patient ID: Justin [name], male    DOB: 1934-03-26, 86 y.o.   MRN: [id number]  HPI Here for 6 months of intermittent "clicking'" or popping in the right ear when he eats. There is no pain. No ST or sinus congestion. No trouble hearing. Also for several months he has had occasional choking when he eats food and this can cause a cough. No ST. No heartburn. He takes Omeprazole 20 mg in the mornings and evenings, and then he takes Pepcid at bedtime. In 2019 he had upper endoscopy which showed evidence of GERD but no strictures.    Review of Systems  Constitutional: Negative.   HENT: Positive for postnasal drip. Negative for sore throat and trouble swallowing.   Respiratory: Positive for cough and choking. Negative for shortness of breath, wheezing and stridor.   Cardiovascular: Negative.        Objective:   Physical Exam Constitutional:      General: He is not in acute distress.    Appearance: Normal appearance.  HENT:     Right Ear: Tympanic membrane, ear canal and external ear normal.     Left Ear: Tympanic membrane, ear canal and external ear normal.     Nose: Nose normal.     Mouth/Throat:     Pharynx: Oropharynx is clear.     Comments: The right TMJ is mildly tender, no crepitus  Eyes:     Conjunctiva/sclera: Conjunctivae normal.  Cardiovascular:     Rate and Rhythm: Normal rate and regular rhythm.     Pulses: Normal pulses.     Heart sounds: Normal heart sounds.  Pulmonary:     Effort: Pulmonary effort is normal.     Breath sounds: Normal breath sounds.  Musculoskeletal:     Cervical back: No rigidity.  Lymphadenopathy:     Cervical: No cervical adenopathy.  Neurological:     Mental Status: He is alert.           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 He has tried appliances from his dentist to stop teeth grinding ar night, but he does not tolerate these. I advised him to avoid chewing gum and to take small  bites. It this becomes painful, we can refer him to an oral surgeon. The choking when he eats is likely due to some microaspiration of food into the upper trachial area. I advised him to eat slowly and to thoroughly chew his food before he swallows. Never lie down immediately after eating. If this gets worse we can do a swallowing study. Justin Penna, MD

## 2020-06-21 DIAGNOSIS — H353221 Exudative age-related macular degeneration, left eye, with active choroidal neovascularization: Secondary | ICD-10-CM | POA: Diagnosis not present

## 2020-06-26 ENCOUNTER — Ambulatory Visit (INDEPENDENT_AMBULATORY_CARE_PROVIDER_SITE_OTHER): Payer: Medicare Other

## 2020-06-26 DIAGNOSIS — Z95810 Presence of automatic (implantable) cardiac defibrillator: Secondary | ICD-10-CM | POA: Diagnosis not present

## 2020-06-26 DIAGNOSIS — I5020 Unspecified systolic (congestive) heart failure: Secondary | ICD-10-CM

## 2020-06-27 ENCOUNTER — Telehealth: Payer: Self-pay | Admitting: Cardiovascular Disease

## 2020-06-27 NOTE — Telephone Encounter (Signed)
° ° °*  STAT* If patient is at the pharmacy, call can be transferred to refill team.   1. Which medications need to be refilled? (please list name of each medication and dose if known)  metoprolol succinate (TOPROL-XL) 50 MG 24 hr tablet furosemide (LASIX) 80 MG tablet potassium chloride SA (KLOR-CON) 20 MEQ tablet sacubitril-valsartan (ENTRESTO) 97-103 MG spironolactone (ALDACTONE) 25 MG tablet  2. Which pharmacy/location (including street and city if local pharmacy) is medication to be sent to? [facility] [facility], [place]  3. Do they need a 30 day or 90 day supply? 90 with Refills    Pt called and said he will be going to [place] for the winter. He needs all of his medications to be written as a 90 day fill so he will have enough to last him the while time he is in [place].

## 2020-06-27 NOTE — Telephone Encounter (Signed)
Called pt to inform him that all of his medication that he requested, has already been sent to his pharmacy with refills. I advised the pt that if he has any other problems, questions or concerns, to give our office a call back. Pt verbalized understanding.

## 2020-06-28 DIAGNOSIS — L905 Scar conditions and fibrosis of skin: Secondary | ICD-10-CM | POA: Diagnosis not present

## 2020-06-28 DIAGNOSIS — Z85828 Personal history of other malignant neoplasm of skin: Secondary | ICD-10-CM | POA: Diagnosis not present

## 2020-06-28 DIAGNOSIS — L821 Other seborrheic keratosis: Secondary | ICD-10-CM | POA: Diagnosis not present

## 2020-06-28 DIAGNOSIS — L570 Actinic keratosis: Secondary | ICD-10-CM | POA: Diagnosis not present

## 2020-06-28 DIAGNOSIS — L814 Other melanin hyperpigmentation: Secondary | ICD-10-CM | POA: Diagnosis not present

## 2020-06-28 NOTE — Progress Notes (Signed)
EPIC Encounter for ICM Monitoring  Patient Name: Justin [name] is a 86 y.o. male Date: 06/28/2020 Primary Care Physican: Laurey Morale, MD Primary Cardiologist:McAlhany Electrophysiologist:Klein Bi-V Pacing:94.3% 8/27/2021Office Weight: [id number]   Spoke with patient and reports feeling well at this time.  Denies fluid symptoms.  Patient will be staying in [place] for 3 months starting Thanksgiving. He will have his device monitor with him.   Optivol thoracic impedancesuggesting normal fluid levels.  Prescribed:   Furosemide80 mgtaketake0.5tablet (40 mg total) by mouth daily.Per [name] pharmacist, he may take extra 20-40 mg as needed for weight gain/swelling.  KLC 20 mEq take 1 tablet daily  Spironolactone 25 mg take 1 tablet daily  Recommendations:No changes and encouraged to call if experiencing any fluid symptoms.  Follow-up plan: ICM clinic phone appointment on12/01/2020. 91 day device clinic remote transmission11/16/2021.  EP/Cardiology Office Visits:07/12/2020 with Dr.McAlhany.  Message sent to EP scheduler to call patient to schedule an appointment with Oda Kilts, PA since he had a recall for 05/07/2020.   Copy of ICM check sent to [facility].  3 month ICM trend: 06/26/2020    1 Year ICM trend:       [name], RN 06/28/2020 4:43 PM

## 2020-06-30 ENCOUNTER — Encounter: Payer: Self-pay | Admitting: Critical Care Medicine

## 2020-06-30 ENCOUNTER — Ambulatory Visit: Payer: Medicare Other | Admitting: Critical Care Medicine

## 2020-06-30 ENCOUNTER — Other Ambulatory Visit: Payer: Self-pay

## 2020-06-30 VITALS — BP 118/72 | HR 60 | Temp 97.2°F | Ht 66.5 in | Wt 270.4 lb

## 2020-06-30 DIAGNOSIS — J309 Allergic rhinitis, unspecified: Secondary | ICD-10-CM

## 2020-06-30 DIAGNOSIS — R059 Cough, unspecified: Secondary | ICD-10-CM | POA: Diagnosis not present

## 2020-06-30 MED ORDER — AZELASTINE HCL 0.1 % NA SOLN: 2.0000 | Freq: Two times a day (BID) | NASAL | 3 refills | Status: DC

## 2020-06-30 MED ORDER — BENZONATATE 200 MG PO CAPS: 200.0000 mg | ORAL_CAPSULE | Freq: Two times a day (BID) | ORAL | 3 refills | Status: DC | PRN

## 2020-06-30 MED ORDER — MONTELUKAST SODIUM 10 MG PO TABS
ORAL_TABLET | ORAL | 3 refills | Status: DC
Start: 2020-06-30 — End: 2021-08-31

## 2020-06-30 MED ORDER — FLUTICASONE PROPIONATE 50 MCG/ACT NA SUSP: 2.0000 | Freq: Every day | NASAL | 3 refills | Status: DC

## 2020-06-30 NOTE — Patient Instructions (Addendum)
Thank you for visiting Dr. Carlis Abbott at [facility]. We recommend the following:   Meds ordered this encounter  Medications  . azelastine (ASTELIN) 0.1 % nasal spray    Sig: Place 2 sprays into both nostrils 2 (two) times daily. Use in each nostril as directed    Dispense:  90 mL    Refill:  3  . fluticasone (FLONASE) 50 MCG/ACT nasal spray    Sig: Place 2 sprays into both nostrils daily.    Dispense:  48 g    Refill:  3  . benzonatate (TESSALON) 200 MG capsule    Sig: Take 1 capsule (200 mg total) by mouth 2 (two) times daily as needed for cough.    Dispense:  180 capsule    Refill:  3  . montelukast (SINGULAIR) 10 MG tablet    Sig: TAKE 1 TABLET ONCE DAILY IN THE EVENING.    Dispense:  90 tablet    Refill:  3    Return if symptoms worsen or fail to improve. .    Please do your part to reduce the spread of COVID-19.

## 2020-06-30 NOTE — Progress Notes (Signed)
Synopsis: Referred in 2017 for asthma by Laurey Morale, MD.  Previously patient of [name].  Subjective:   PATIENT ID: Justin [name] GENDER: male DOB: 1933/09/05, MRN: [id number]  Chief Complaint  Patient presents with  . Follow-up    no complaints     Justin [name] is an 86 year old gentleman with history of allergic rhinitis and irritable larynx syndrome who presents for follow-up.  Since he was last seen in February he was seen in follow-up by NP [facility] with worse sinus drainage causing cough.  He continues on Pepcid and omeprazole for his GERD.  He continues on Singulair, Zyrtec, Flonase in the morning, and allergy shots.  He also takes Benadryl at night because he feels that his Zyrtec only lasts about 12 hours.  He uses azelastine nasal spray 2 sprays twice daily.  He takes Best boy twice per day.  He feels that his mouth gets very dry overnight and wants to try adjusting his medications to help with this.  He is planning on spending 3 months this winter with his wife in [place] city and would like a 45-month supply of medications to go down there.  He is wondering if he would be able to come off allergy shots to do this.  He has 2 areas of his nose that are irritated from constantly having to blow his nose.  He endorses frequent sneezing, especially in spells that require sneezing 7 or 8 times.  He is up-to-date on his flu vaccine and has been fully vaccinated for Covid, including his booster.   OV 10/18/19: Justin [name] is an 86 year old gentleman with a history of irritable larynx syndrome and allergic rhinosinusitis who presents for follow-up.  He was previously thought to have asthma, but had no improvement or worsening of symptoms with starting or stopping ICS-LABA inhalers.  He has had an episode of esophageal candidiasis in the past, prompting him to stop his Symbicort.  He has not used any inhalers in several years.  He has been on combination fluticasone and ipratropium  nasal spray, Zyrtec, Benadryl nightly for several years.  He gets allergy shots every Monday at Dr. Marjie Skiff office.  He has a chronic cough due to postnasal drip which is stable.  He complains of mildly increased dyspnea on exertion recently, mostly in the shower or when he is leaning over.  He has no lung congestion, coughing, sputum production.  He has significant ongoing clear nasal drainage.  He continues to treat his GERD with Prilosec and Zantac.   Flowsheet data:  Lab Results  Component Value Date   NITRICOXIDE 43 08/18/2018    Past Medical History:  Diagnosis Date  . Age-related macular degeneration, dry, both eyes   . Allergic    "24/7; 365 days/year; I'm allergic to pollens, dust, all southern grasses/trees, mold, mildue, cats, dogs" (01/07/2017)  . Anal fissure   . Asthma    sees [name]   . Benign prostatic hypertrophy    (sees Dr. Denman George  . CAD (coronary artery disease)    a. s/p CABG 2007. b. Cath 08/2016 - 4/4 patent grafts.  . Carotid bruit    carotid u/s 10/10: 0.39% bilaterally  . Chronic combined systolic and diastolic CHF (congestive heart failure) ([place])   . Complication of anesthesia 1980s   "w/anal cyst OR, he gave me a saddle block then put a narcotic in spinal cord; had a severe reaction to that" (01/07/2017)  . Congestive heart failure (CHF) ([place])   .  ED (erectile dysfunction)   . Family history of adverse reaction to anesthesia    "daughter wakes up during OR" (01/07/2017)  . GERD (gastroesophageal reflux disease)   . Gout   . HTN (hypertension)   . Hx of colonic polyps    (sees Dr. Henrene Pastor)  . Hyperlipidemia   . Hypothyroidism   . Moderate to severe aortic stenosis    a. s/p TAVR 02/2017.  [name] Myocardial infarction ([place]) ~ 2000  . Obesity   . Osteoarthritis    "was in my knees, hands" (01/07/2017 )  . PAF (paroxysmal atrial fibrillation) ([place])    a. documented post TAVR.  [name] Precancerous skin lesion    (sees Dr. Allyson Sabal)  . Prostate cancer  ([place]) dx'd ~ 2014  . 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 Heart attack Father 30  . Allergic rhinitis Father   . Asthma Father   . Heart failure Mother 68  . Uterine cancer Mother   . Breast cancer Mother   . Colon cancer Neg Hx   . Esophageal cancer Neg Hx      Past Surgical History:  Procedure Laterality Date  . ANUS SURGERY     "opened it back up cause it wouldn't heal; wound up w/a fissure" (01/07/2017)  . BIV ICD INSERTION CRT-D N/A 05/02/2017   Procedure: BIV ICD INSERTION CRT-D;  Surgeon: Deboraha Sprang, MD;  Location: [facility];  Service: Cardiovascular;  Laterality: N/A;  . CARDIAC CATHETERIZATION  10/29/2005  . CARDIAC CATHETERIZATION N/A 08/27/2016   Procedure: Right/Left Heart Cath and Coronary/Graft Angiography;  Surgeon: Burnell Blanks, MD;  Location: [facility];  Service: Cardiovascular;  Laterality: N/A;  . CATARACT EXTRACTION W/ INTRAOCULAR LENS  IMPLANT, BILATERAL Bilateral   . CATARACT EXTRACTION, BILATERAL  2012  . COLONOSCOPY  06/30/2008   no repeats needed   . COLONOSCOPY     had 3 or 4 in the past   . CORONARY ARTERY BYPASS GRAFT  2007   "CABG X4"  . CYST EXCISION PERINEAL  1980s  . HAMMER TOE SURGERY Bilateral   . JOINT REPLACEMENT    . KNEE ARTHROPLASTY  07/30/2011   Procedure: COMPUTER ASSISTED TOTAL KNEE ARTHROPLASTY;  Surgeon: Meredith Pel;  Location: [facility];  Service: Orthopedics;  Laterality: Left;  left total knee arthroplasty  . MASTECTOMY SUBCUTANEOUS Bilateral   . MULTIPLE TOOTH EXTRACTIONS    . ORIF FINGER / THUMB FRACTURE Right ~ 1980   "repair of thumb injury"  . PROSTATE BIOPSY    . REPLACEMENT TOTAL KNEE BILATERAL Bilateral 2012  . TEE WITHOUT CARDIOVERSION N/A 03/04/2017   Procedure: TRANSESOPHAGEAL  ECHOCARDIOGRAM (TEE);  Surgeon: Burnell Blanks, MD;  Location: [facility];  Service: Open Heart Surgery;  Laterality: N/A;  . TOTAL KNEE REVISION Right 05/18/2019   Procedure: RIGHT PATELLA REVISION/REMOVAL;  Surgeon: Meredith Pel, MD;  Location: [facility];  Service: Orthopedics;  Laterality: Right;  . TRANSCATHETER AORTIC VALVE REPLACEMENT, TRANSFEMORAL N/A 03/04/2017   Procedure: TRANSCATHETER AORTIC VALVE REPLACEMENT, TRANSFEMORAL;  Surgeon: Burnell Blanks, MD;  Location: [facility];  Service: Open Heart Surgery;  Laterality: N/A;    Social History   Socioeconomic History  . Marital status: Married    Spouse name: Not on file  .  Number of children: 3  . Years of education: Not on file  . Highest education level: Not on file  Occupational History  . Occupation: Clinical biochemist    Comment: builds malls  Tobacco Use  . Smoking status: Former Smoker    Packs/day: 3.50    Years: 13.00    Pack years: 45.50    Types: Cigarettes    Quit date: 1963    Years since quitting: 58.8  . Smokeless tobacco: Never Used  Vaping Use  . Vaping Use: Never used  Substance and Sexual Activity  . Alcohol use: Yes    Alcohol/week: 0.0 standard drinks    Comment: 01/07/2017 "might have 2 beers 2-3 times/month"  . Drug use: No  . Sexual activity: Never  Other Topics Concern  . Not on file  Social History Narrative   FH of CAD, Male 1st degree relative less than age 6.   Social Determinants of Health   Financial Resource Strain: Low Risk   . Difficulty of Paying Living Expenses: Not hard at all  Food Insecurity:   . Worried About Charity fundraiser in the Last Year: Not on file  . Ran Out of Food in the Last Year: Not on file  Transportation Needs: No Transportation Needs  . Lack of Transportation (Medical): No  . Lack of Transportation (Non-Medical): No  Physical Activity:   . Days of Exercise per Week: Not on file  . Minutes of Exercise per Session: Not on file  Stress:   .  Feeling of Stress : Not on file  Social Connections:   . Frequency of Communication with Friends and Family: Not on file  . Frequency of Social Gatherings with Friends and Family: Not on file  . Attends Religious Services: Not on file  . Active Member of Clubs or Organizations: Not on file  . Attends Archivist Meetings: Not on file  . Marital Status: Not on file  Intimate Partner Violence:   . Fear of Current or Ex-Partner: Not on file  . Emotionally Abused: Not on file  . Physically Abused: Not on file  . Sexually Abused: Not on file     Allergies  Allergen Reactions  . Peanut-Containing Drug Products Anaphylaxis  . Sulfonamide Derivatives Anaphylaxis  . Amlodipine Swelling    Swelling in ankles  . Eliquis [Apixaban] Other (See Comments)    Back/hip pain  . Lisinopril Cough    cough  . Xarelto [Rivaroxaban] Other (See Comments)    Back/hip pain     Immunization History  Administered Date(s) Administered  . Fluad Quad(high Dose 65+) 05/06/2019, 05/30/2020  . Hep A / Hep B 04/06/2012, 10/07/2012  . Hepatitis B 05/07/2012  . Influenza Split 06/28/2013  . Influenza Whole 06/05/2007, 05/13/2008, 06/06/2009, 05/30/2010  . Influenza, High Dose Seasonal PF 05/15/2018  . Influenza,inj,Quad PF,6+ Mos 05/20/2016  . Influenza-Unspecified 05/25/2014, 07/08/2015, 05/06/2017  . PFIZER SARS-COV-2 Vaccination 09/28/2019, 10/12/2019, 05/30/2020  . Pneumococcal Conjugate-13 09/18/2015  . Pneumococcal Polysaccharide-23 05/13/2008, 05/25/2014  . Td 05/30/2010  . Zoster 10/21/2007  . Zoster Recombinat (Shingrix) 05/18/2018, 10/16/2018    Outpatient Medications Prior to Visit  Medication Sig Dispense Refill  . aspirin 81 MG tablet Take 1 tablet (81 mg total) by mouth daily. 30 tablet 0  . atorvastatin (LIPITOR) 20 MG tablet TAKE 1 TABLET ONCE DAILY. 90 tablet 0  . Azelastine HCl 0.15 % SOLN Place 2 sprays into the nose in the morning and at bedtime. 30 mL 11  .  cetirizine  (ZYRTEC) 10 MG tablet Take 10 mg by mouth daily.    . chlorpheniramine (CHLOR-TRIMETON) 4 MG tablet Take 2 tablets (8 mg total) by mouth at bedtime. 60 tablet 12  . dabigatran (PRADAXA) 150 MG CAPS capsule TAKE (1) CAPSULE TWICE DAILY. 180 capsule 3  . diphenhydrAMINE (DIPHENHIST) 25 MG tablet Take 25 mg by mouth at bedtime.     [name] EPINEPHrine 0.3 mg/0.3 mL IJ SOAJ injection Inject 0.3 mLs (0.3 mg total) into the muscle as needed for anaphylaxis. 2 Device 1  . famotidine (PEPCID) 10 MG tablet Take 20 mg by mouth at bedtime.     . fluticasone (FLONASE) 50 MCG/ACT nasal spray Place 2 sprays into both nostrils daily. 16 g 11  . furosemide (LASIX) 80 MG tablet Take 0.5 tablets (40 mg total) by mouth daily. 45 tablet 3  . hydrOXYzine (ATARAX/VISTARIL) 10 MG tablet Take 10 mg by mouth at bedtime.     . isosorbide mononitrate (IMDUR) 30 MG 24 hr tablet TAKE 1 TABLET ONCE DAILY. 90 tablet 0  . Ketotifen Fumarate (ZADITOR OP) Apply 1 drop to eye daily as needed (itchy eyes).     . Lactobacillus (PROBIOTIC ACIDOPHILUS PO) Take 1 capsule by mouth daily.    [name] levothyroxine (SYNTHROID) 100 MCG tablet TAKE 1 TABLET ONCE DAILY. 90 tablet 0  . methylPREDNISolone (MEDROL DOSEPAK) 4 MG TBPK tablet As directed 6 tablet 0  . metoprolol succinate (TOPROL-XL) 50 MG 24 hr tablet Take 1.5 tablets (75 mg total) by mouth daily. Take with or immediately following a meal. 135 tablet 3  . Multiple Vitamins-Minerals (PRESERVISION AREDS PO) Take 2 tablets by mouth every morning.    [name] omeprazole (PRILOSEC) 20 MG capsule TAKE (1) CAPSULE TWICE DAILY. 180 capsule 3  . potassium chloride SA (KLOR-CON) 20 MEQ tablet Take 1 tablet by mouth daily 90 tablet 3  . sacubitril-valsartan (ENTRESTO) 97-103 MG Take 1 tablet by mouth 2 (two) times daily. 180 tablet 3  . spironolactone (ALDACTONE) 25 MG tablet Take 1 tablet (25 mg total) by mouth daily. 90 tablet 3  . triamcinolone cream (KENALOG) 0.1 % Apply 1 application topically daily as  needed for dry skin.    . valACYclovir (VALTREX) 500 MG tablet Take 500 mg by mouth 2 (two) times daily as needed (cold sores).    . benzonatate (TESSALON) 200 MG capsule TAKE 1 CAPSULE EVERY 8 HOURS AS NEEDED FOR COUGH. 90 capsule 5  . montelukast (SINGULAIR) 10 MG tablet TAKE 1 TABLET ONCE DAILY IN THE EVENING. 90 tablet 3   No facility-administered medications prior to visit.    Review of Systems  Constitutional: Negative for chills, fever and malaise/fatigue.  HENT: Negative for congestion.   Respiratory: Positive for cough and shortness of breath.   Cardiovascular: Negative for chest pain and leg swelling.     Objective:   Vitals:   06/30/20 1410  BP: 118/72  Pulse: 60  Temp: (!) 97.2 F (36.2 C)  SpO2: 99%  Weight: 270 lb 6.4 oz (122.7 kg)  Height: 5' 6.5" (1.689 m)   99% on   RA BMI Readings from Last 3 Encounters:  06/30/20 42.99 kg/m  06/20/20 43.71 kg/m  04/21/20 43.42 kg/m   Wt Readings from Last 3 Encounters:  06/30/20 270 lb 6.4 oz (122.7 kg)  06/20/20 270 lb 12.8 oz (122.8 kg)  04/21/20 269 lb (122 kg)    Physical Exam Vitals reviewed.  Constitutional:      General: He is not  in acute distress.    Appearance: Normal appearance. He is obese. He is not ill-appearing.  HENT:     Head: Normocephalic and atraumatic.     Nose:     Comments: No significant areas visible of significant skin breakdown around his nose Eyes:     General: No scleral icterus. Cardiovascular:     Rate and Rhythm: Normal rate and regular rhythm.     Heart sounds: No murmur heard.   Pulmonary:     Comments: Breathing comfortably on room air, no conversational dyspnea.  No coughing or sneezing observed.  Clear to auscultation bilaterally. Abdominal:     General: There is no distension.     Palpations: Abdomen is soft.     Tenderness: There is no abdominal tenderness.  Musculoskeletal:        General: No swelling or deformity.     Cervical back: Neck supple.    Lymphadenopathy:     Cervical: No cervical adenopathy.  Skin:    General: Skin is warm and dry.     Findings: No rash.  Neurological:     General: No focal deficit present.     Mental Status: He is alert.     Coordination: Coordination normal.  Psychiatric:        Mood and Affect: Mood normal.        Behavior: Behavior normal.      CBC    Component Value Date/Time   WBC 10.3 03/21/2020 0943   RBC 5.00 03/21/2020 0943   HGB 14.1 03/21/2020 0943   HGB 13.1 11/09/2019 0831   HCT 43.5 03/21/2020 0943   HCT 40.5 11/09/2019 0831   PLT 201 03/21/2020 0943   PLT 224 11/09/2019 0831   MCV 87.0 03/21/2020 0943   MCV 84 11/09/2019 0831   MCH 28.2 03/21/2020 0943   MCHC 32.4 03/21/2020 0943   RDW 15.0 03/21/2020 0943   RDW 14.8 11/09/2019 0831   LYMPHSABS 2,451 03/21/2020 0943   LYMPHSABS 2.0 04/23/2017 1012   MONOABS 1.1 (H) 03/11/2019 0858   EOSABS 258 03/21/2020 0943   EOSABS 0.3 04/23/2017 1012   BASOSABS 82 03/21/2020 0943   BASOSABS 0.1 04/23/2017 1012    CHEMISTRY No results for input(s): NA, K, CL, CO2, GLUCOSE, BUN, CREATININE, CALCIUM, MG, PHOS in the last 168 hours. CrCl cannot be calculated (Patient's most recent lab result is older than the maximum 21 days allowed.).   Chest Imaging- films reviewed: CXR, 2 view 04/30/2019-ICD in place, ectatic aorta versus hiatal hernia.  Pulmonary Functions Testing Results: PFT Results Latest Ref Rng & Units 01/28/2017  FVC-Pre L 2.68  FVC-Predicted Pre % 76  FVC-Post L 2.59  FVC-Predicted Post % 73  Pre FEV1/FVC % % 79  Post FEV1/FCV % % 82  FEV1-Pre L 2.12  FEV1-Predicted Pre % 85  FEV1-Post L 2.13  DLCO uncorrected ml/min/mmHg 16.31  DLCO UNC% % 57  DLVA Predicted % 81  TLC L 4.67  TLC % Predicted % 72  RV % Predicted % 74   2018- No significant obstruction or bronchodilator reversibility. Mild restriction, moderate diffusion impairment.  Spirometry 09/24/2018: FVC 2.26 (67%) FEV1 1.76 (76%) Ratio  78   Echocardiogram 04/29/2019: LVEF 35 to 40%, moderate LVH, diastolic dysfunction.  Severe hypokinesis of LV anterior wall, anteroseptal wall, and apical segment.  Mildly dilated LA, reduced RV systolic function.  Mildly dilated RA.  Mild mitral valve thickening, aortic valve replacement present-TAVR.  Mild dilation of ascending aorta.  Heart Catheterization 08/27/2016:  There is moderate aortic valve stenosis.  SVG graft was visualized by angiography and is normal in caliber.  1st Mrg lesion, 65 %stenosed.  Ost LM to LM lesion, 100 %stenosed.  SVG graft was visualized by angiography and is normal in caliber.  LIMA graft was visualized by angiography and is normal in caliber.  Prox RCA to Mid RCA lesion, 100 %stenosed.  SVG graft was visualized by angiography and is normal in caliber and anatomically normal.  Hemodynamic findings consistent with mild pulmonary hypertension.   1. 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  2. The Left main is occluded at the ostium.  3. The LAD fills from the patent IMA graft and the Diagonal fills from the patent vein graft 4. The Circumflex fills from the patent vein graft to the OM. There is a moderate stenosis in the proximal segment of the OM branch proximal to the insertion of the vein graft.  5. The distal RCA fills from the patent vein graft.  6. Moderate AS 7. Elevated filling pressures    Assessment & Plan:     ICD-10-CM   1. Allergic rhinitis, unspecified seasonality, unspecified trigger  J30.9   2. Morbid obesity due to excess calories (HCC)  E66.01   3. Cough  R05.9 benzonatate (TESSALON) 200 MG capsule    Allergic rhinitis, possibly also vasomotor rhinitis causing chronic cough -Continue Flonase and ipratropium nasal sprays. If he is having excessive nasal dryness, recommend decreasing ipratropium to once daily.he is reluctant to not use Benadryl at night.  An  alternative option could be using half dose Zyrtec at night rather than Benadryl. -Continue Zyrtec, montelukast, and Flonase in the morning. -Continue follow-up with allergist.  Recommend following up with Allergist before making a decision to discontinue allergy shots during his trip to [place].  All meds were refilled today. -Can try topical antibiotic ointment for the broken down skin around his nose that is bothersome -Does not require inhalers. -Okay to continue Tessalon as needed -Optimize GERD management; continue PPI and H2 blocker.  Recommend modest weight loss and dietary modification.  Lung restriction, likely due to body habitus.  Dyspnea on exertion likely due to underlying cardiac disease, body habitus. -Recommend modest weight loss to maintain healthy weight.   RTC PRN.   Current Outpatient Medications:  .  aspirin 81 MG tablet, Take 1 tablet (81 mg total) by mouth daily., Disp: 30 tablet, Rfl: 0 .  atorvastatin (LIPITOR) 20 MG tablet, TAKE 1 TABLET ONCE DAILY., Disp: 90 tablet, Rfl: 0 .  Azelastine HCl 0.15 % SOLN, Place 2 sprays into the nose in the morning and at bedtime., Disp: 30 mL, Rfl: 11 .  benzonatate (TESSALON) 200 MG capsule, Take 1 capsule (200 mg total) by mouth 2 (two) times daily as needed for cough., Disp: 180 capsule, Rfl: 3 .  cetirizine (ZYRTEC) 10 MG tablet, Take 10 mg by mouth daily., Disp: , Rfl:  .  chlorpheniramine (CHLOR-TRIMETON) 4 MG tablet, Take 2 tablets (8 mg total) by mouth at bedtime., Disp: 60 tablet, Rfl: 12 .  dabigatran (PRADAXA) 150 MG CAPS capsule, TAKE (1) CAPSULE TWICE DAILY., Disp: 180 capsule, Rfl: 3 .  diphenhydrAMINE (DIPHENHIST) 25 MG tablet, Take 25 mg by mouth at bedtime. , Disp: , Rfl:  .  EPINEPHrine 0.3 mg/0.3 mL IJ SOAJ injection, Inject 0.3 mLs (0.3 mg total) into the muscle as needed for anaphylaxis., Disp: 2 Device, Rfl:  1 .  famotidine (PEPCID) 10 MG tablet, Take 20 mg by mouth at bedtime. , Disp: , Rfl:  .  fluticasone  (FLONASE) 50 MCG/ACT nasal spray, Place 2 sprays into both nostrils daily., Disp: 16 g, Rfl: 11 .  furosemide (LASIX) 80 MG tablet, Take 0.5 tablets (40 mg total) by mouth daily., Disp: 45 tablet, Rfl: 3 .  hydrOXYzine (ATARAX/VISTARIL) 10 MG tablet, Take 10 mg by mouth at bedtime. , Disp: , Rfl:  .  isosorbide mononitrate (IMDUR) 30 MG 24 hr tablet, TAKE 1 TABLET ONCE DAILY., Disp: 90 tablet, Rfl: 0 .  Ketotifen Fumarate (ZADITOR OP), Apply 1 drop to eye daily as needed (itchy eyes). , Disp: , Rfl:  .  Lactobacillus (PROBIOTIC ACIDOPHILUS PO), Take 1 capsule by mouth daily., Disp: , Rfl:  .  levothyroxine (SYNTHROID) 100 MCG tablet, TAKE 1 TABLET ONCE DAILY., Disp: 90 tablet, Rfl: 0 .  methylPREDNISolone (MEDROL DOSEPAK) 4 MG TBPK tablet, As directed, Disp: 6 tablet, Rfl: 0 .  metoprolol succinate (TOPROL-XL) 50 MG 24 hr tablet, Take 1.5 tablets (75 mg total) by mouth daily. Take with or immediately following a meal., Disp: 135 tablet, Rfl: 3 .  montelukast (SINGULAIR) 10 MG tablet, TAKE 1 TABLET ONCE DAILY IN THE EVENING., Disp: 90 tablet, Rfl: 3 .  Multiple Vitamins-Minerals (PRESERVISION AREDS PO), Take 2 tablets by mouth every morning., Disp: , Rfl:  .  omeprazole (PRILOSEC) 20 MG capsule, TAKE (1) CAPSULE TWICE DAILY., Disp: 180 capsule, Rfl: 3 .  potassium chloride SA (KLOR-CON) 20 MEQ tablet, Take 1 tablet by mouth daily, Disp: 90 tablet, Rfl: 3 .  sacubitril-valsartan (ENTRESTO) 97-103 MG, Take 1 tablet by mouth 2 (two) times daily., Disp: 180 tablet, Rfl: 3 .  spironolactone (ALDACTONE) 25 MG tablet, Take 1 tablet (25 mg total) by mouth daily., Disp: 90 tablet, Rfl: 3 .  triamcinolone cream (KENALOG) 0.1 %, Apply 1 application topically daily as needed for dry skin., Disp: , Rfl:  .  valACYclovir (VALTREX) 500 MG tablet, Take 500 mg by mouth 2 (two) times daily as needed (cold sores)., Disp: , Rfl:  .  azelastine (ASTELIN) 0.1 % nasal spray, Place 2 sprays into both nostrils 2 (two) times  daily. Use in each nostril as directed, Disp: 90 mL, Rfl: 3 .  fluticasone (FLONASE) 50 MCG/ACT nasal spray, Place 2 sprays into both nostrils daily., Disp: 48 g, Rfl: 3    Julian Hy, DO [facility] 06/30/2020 2:38 PM

## 2020-07-03 ENCOUNTER — Telehealth: Payer: Self-pay | Admitting: *Deleted

## 2020-07-03 ENCOUNTER — Ambulatory Visit (INDEPENDENT_AMBULATORY_CARE_PROVIDER_SITE_OTHER): Payer: Medicare Other | Admitting: *Deleted

## 2020-07-03 DIAGNOSIS — J309 Allergic rhinitis, unspecified: Secondary | ICD-10-CM | POA: Diagnosis not present

## 2020-07-03 NOTE — Telephone Encounter (Signed)
Patient called stating that he is going to be going to [place] for a few months during the winter. He wants to know should he try to find an allergist down there to get his injections while he is there or should he just hold off and restart when he gets back. Please advise.

## 2020-07-04 ENCOUNTER — Other Ambulatory Visit: Payer: Self-pay | Admitting: Family Medicine

## 2020-07-04 DIAGNOSIS — E039 Hypothyroidism, unspecified: Secondary | ICD-10-CM

## 2020-07-04 NOTE — Telephone Encounter (Signed)
Okay great.  If he can identify an allergist there that would be more convenient for him to get to his shots then that would be great.  Otherwise if he is unable to find a local allergist then I can see if I can identify one for him that have will be able to continue injections.

## 2020-07-04 NOTE — Telephone Encounter (Signed)
Called and spoke with patient and advised. Patient verbalized understanding and will have his daughter help him locate a doctor in [place]. I advised that if he already went down and found a doctor and established care and after faxing the paperwork and receiving it back signed by his doctor I can mail his vials to their office. Patient verbalized understanding and will be in touch.

## 2020-07-04 NOTE — Telephone Encounter (Signed)
He is going to [place] I do believe. I don't think he does know anyone right off hand. I did state that that was an option but he still wanted me to reach out to you and see what you advised. I can give him a call and see if he can set up care down there.

## 2020-07-04 NOTE — Telephone Encounter (Signed)
What part of [place] is he going to? I would recommend finding an allergist down there they can continue his allergy shots so that we do not have to potentially restart therapy. Does he already know of any allergist in the area where he will be living?  If so we can help arrange getting his shots there until he returns to the [place] area.

## 2020-07-09 LAB — CUP PACEART REMOTE DEVICE CHECK
Battery Remaining Longevity: 23 mo
Battery Voltage: 2.94 V
Brady Statistic AP VP Percent: 6.42 %
Brady Statistic AP VS Percent: 0.67 %
Brady Statistic AS VP Percent: 86.91 %
Brady Statistic AS VS Percent: 6 %
Brady Statistic RA Percent Paced: 6.84 %
Brady Statistic RV Percent Paced: 85.74 %
Date Time Interrogation Session: 20211113063523
HighPow Impedance: 82 Ohm
Implantable Lead Implant Date: 20180907
Implantable Lead Implant Date: 20180907
Implantable Lead Implant Date: 20180907
Implantable Lead Location: 753858
Implantable Lead Location: 753859
Implantable Lead Location: 753860
Implantable Lead Model: 4396
Implantable Lead Model: 5076
Implantable Pulse Generator Implant Date: 20180907
Lead Channel Impedance Value: 380 Ohm
Lead Channel Impedance Value: 380 Ohm
Lead Channel Impedance Value: 437 Ohm
Lead Channel Impedance Value: 494 Ohm
Lead Channel Impedance Value: 570 Ohm
Lead Channel Impedance Value: 893 Ohm
Lead Channel Pacing Threshold Amplitude: 0.75 V
Lead Channel Pacing Threshold Amplitude: 0.875 V
Lead Channel Pacing Threshold Amplitude: 1.125 V
Lead Channel Pacing Threshold Pulse Width: 0.4 ms
Lead Channel Pacing Threshold Pulse Width: 0.4 ms
Lead Channel Pacing Threshold Pulse Width: 1 ms
Lead Channel Sensing Intrinsic Amplitude: 2.25 mV
Lead Channel Sensing Intrinsic Amplitude: 2.25 mV
Lead Channel Sensing Intrinsic Amplitude: 9.625 mV
Lead Channel Sensing Intrinsic Amplitude: 9.625 mV
Lead Channel Setting Pacing Amplitude: 1.5 V
Lead Channel Setting Pacing Amplitude: 2 V
Lead Channel Setting Pacing Amplitude: 2.5 V
Lead Channel Setting Pacing Pulse Width: 0.4 ms
Lead Channel Setting Pacing Pulse Width: 1 ms
Lead Channel Setting Sensing Sensitivity: 0.3 mV

## 2020-07-10 ENCOUNTER — Ambulatory Visit (INDEPENDENT_AMBULATORY_CARE_PROVIDER_SITE_OTHER): Payer: Medicare Other

## 2020-07-10 DIAGNOSIS — I428 Other cardiomyopathies: Secondary | ICD-10-CM

## 2020-07-11 ENCOUNTER — Ambulatory Visit (INDEPENDENT_AMBULATORY_CARE_PROVIDER_SITE_OTHER): Payer: Medicare Other | Admitting: Family Medicine

## 2020-07-11 ENCOUNTER — Other Ambulatory Visit: Payer: Self-pay

## 2020-07-11 ENCOUNTER — Encounter: Payer: Self-pay | Admitting: Family Medicine

## 2020-07-11 VITALS — BP 106/52 | HR 71 | Temp 97.6°F | Ht 66.5 in | Wt 269.2 lb

## 2020-07-11 DIAGNOSIS — I10 Essential (primary) hypertension: Secondary | ICD-10-CM

## 2020-07-11 DIAGNOSIS — R42 Dizziness and giddiness: Secondary | ICD-10-CM | POA: Insufficient documentation

## 2020-07-11 DIAGNOSIS — I7300 Raynaud's syndrome without gangrene: Secondary | ICD-10-CM | POA: Diagnosis not present

## 2020-07-11 DIAGNOSIS — M109 Gout, unspecified: Secondary | ICD-10-CM | POA: Diagnosis not present

## 2020-07-11 MED ORDER — MECLIZINE HCL 25 MG PO TABS: 25.0000 mg | ORAL_TABLET | ORAL | 2 refills | Status: DC | PRN

## 2020-07-11 MED ORDER — AZITHROMYCIN 250 MG PO TABS: ORAL_TABLET | ORAL | 0 refills | Status: DC

## 2020-07-11 MED ORDER — METHYLPREDNISOLONE 4 MG PO TBPK: ORAL_TABLET | ORAL | 0 refills | Status: DC

## 2020-07-11 NOTE — Progress Notes (Signed)
Cardiology Office Note Date:  07/12/2020  Patient ID:  Cj, Edgell Nov 20, 1933, MRN [id number] PCP:  Laurey Morale, MD  Cardiologist:  Dr. Angelena Form Electrophysiologist: Dr. [name]   Chief Complaint:  over due device visit  History of Present Illness: TERRIL CHESTNUT is a 86 y.o. male with history of CAD (CABG 2007, Last cardiac cath in January 2018 with 4/4 patent bypass grafts), VHD (TAVR 03/04/2017), LBBB, chronic CHF, NSVT,  CRT-D, HTN, HLD, he had post TAVR AFib   He comes today to be seen for Dr. [name], last seen by him march 2020, at that visit he mentioned VT, prolonged, nonsustained, diuretic increased that visit.  More recently she saw Dr. Angelena Form Aug 2021, she was doing well, no changes were made.  He feels quite well. Is going to [place] soon to stay until march. He denies any kind of CP, palpitations or cardiac awareness. No SOB or OE, feels like he has very good exertional capacity No dizzy spells, near syncope or syncope. No bleeding or signs of bleeding Denies orthostatic symptoms Mentions a small ammount of chronic LE edema    Device information MDT CRT-D  implanted 05/02/2018 for syncope, CM, new LBBB post TAVR   Past Medical History:  Diagnosis Date  . Age-related macular degeneration, dry, both eyes   . Allergic    "24/7; 365 days/year; I'm allergic to pollens, dust, all southern grasses/trees, mold, mildue, cats, dogs" (01/07/2017)  . Anal fissure   . Asthma    sees [name]   . Benign prostatic hypertrophy    (sees Dr. Denman George  . CAD (coronary artery disease)    a. s/p CABG 2007. b. Cath 08/2016 - 4/4 patent grafts.  . Carotid bruit    carotid u/s 10/10: 0.39% bilaterally  . Chronic combined systolic and diastolic CHF (congestive heart failure) ([place])   . Complication of anesthesia 1980s   "w/anal cyst OR, he gave me a saddle block then put a narcotic in spinal cord; had a severe reaction to that" (01/07/2017)  . Congestive heart  failure (CHF) ([place])   . ED (erectile dysfunction)   . Family history of adverse reaction to anesthesia    "daughter wakes up during OR" (01/07/2017)  . GERD (gastroesophageal reflux disease)   . Gout   . HTN (hypertension)   . Hx of colonic polyps    (sees Dr. Henrene Pastor)  . Hyperlipidemia   . Hypothyroidism   . Moderate to severe aortic stenosis    a. s/p TAVR 02/2017.  [name] Myocardial infarction ([place]) ~ 2000  . Obesity   . Osteoarthritis    "was in my knees, hands" (01/07/2017 )  . PAF (paroxysmal atrial fibrillation) ([place])    a. documented post TAVR.  [name] Precancerous skin lesion    (sees Dr. Allyson Sabal)  . Prostate cancer ([place]) dx'd ~ 2014  . 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 ANUS SURGERY     "opened it back up cause it wouldn't heal; wound up w/a fissure" (01/07/2017)  . BIV ICD INSERTION CRT-D N/A 05/02/2017   Procedure: BIV ICD INSERTION CRT-D;  Surgeon: Deboraha Sprang, MD;  Location: [facility];  Service: Cardiovascular;  Laterality: N/A;  . CARDIAC CATHETERIZATION  10/29/2005  . CARDIAC CATHETERIZATION N/A 08/27/2016  Procedure: Right/Left Heart Cath and Coronary/Graft Angiography;  Surgeon: Burnell Blanks, MD;  Location: [facility];  Service: Cardiovascular;  Laterality: N/A;  . CATARACT EXTRACTION W/ INTRAOCULAR LENS  IMPLANT, BILATERAL Bilateral   . CATARACT EXTRACTION, BILATERAL  2012  . COLONOSCOPY  06/30/2008   no repeats needed   . COLONOSCOPY     had 3 or 4 in the past   . CORONARY ARTERY BYPASS GRAFT  2007   "CABG X4"  . CYST EXCISION PERINEAL  1980s  . HAMMER TOE SURGERY Bilateral   . JOINT REPLACEMENT    . KNEE ARTHROPLASTY  07/30/2011   Procedure: COMPUTER ASSISTED TOTAL KNEE ARTHROPLASTY;  Surgeon: Meredith Pel;  Location: [facility];   Service: Orthopedics;  Laterality: Left;  left total knee arthroplasty  . MASTECTOMY SUBCUTANEOUS Bilateral   . MULTIPLE TOOTH EXTRACTIONS    . ORIF FINGER / THUMB FRACTURE Right ~ 1980   "repair of thumb injury"  . PROSTATE BIOPSY    . REPLACEMENT TOTAL KNEE BILATERAL Bilateral 2012  . TEE WITHOUT CARDIOVERSION N/A 03/04/2017   Procedure: TRANSESOPHAGEAL ECHOCARDIOGRAM (TEE);  Surgeon: Burnell Blanks, MD;  Location: [facility];  Service: Open Heart Surgery;  Laterality: N/A;  . TOTAL KNEE REVISION Right 05/18/2019   Procedure: RIGHT PATELLA REVISION/REMOVAL;  Surgeon: Meredith Pel, MD;  Location: [facility];  Service: Orthopedics;  Laterality: Right;  . TRANSCATHETER AORTIC VALVE REPLACEMENT, TRANSFEMORAL N/A 03/04/2017   Procedure: TRANSCATHETER AORTIC VALVE REPLACEMENT, TRANSFEMORAL;  Surgeon: Burnell Blanks, MD;  Location: [facility];  Service: Open Heart Surgery;  Laterality: N/A;    Current Outpatient Medications  Medication Sig Dispense Refill  . aspirin 81 MG tablet Take 1 tablet (81 mg total) by mouth daily. 30 tablet 0  . atorvastatin (LIPITOR) 20 MG tablet TAKE 1 TABLET ONCE DAILY. 90 tablet 0  . azelastine (ASTELIN) 0.1 % nasal spray Place 2 sprays into both nostrils 2 (two) times daily. Use in each nostril as directed 90 mL 3  . Azelastine HCl 0.15 % SOLN Place 2 sprays into the nose in the morning and at bedtime. 30 mL 11  . benzonatate (TESSALON) 200 MG capsule Take 1 capsule (200 mg total) by mouth 2 (two) times daily as needed for cough. 180 capsule 3  . cetirizine (ZYRTEC) 10 MG tablet Take 10 mg by mouth daily.    . chlorpheniramine (CHLOR-TRIMETON) 4 MG tablet Take 2 tablets (8 mg total) by mouth at bedtime. 60 tablet 12  . dabigatran (PRADAXA) 150 MG CAPS capsule TAKE (1) CAPSULE TWICE DAILY. 180 capsule 3  . diphenhydrAMINE (DIPHENHIST) 25 MG tablet Take 25 mg by mouth at bedtime.     [name] EPINEPHrine 0.3 mg/0.3 mL IJ SOAJ injection Inject 0.3 mLs (0.3 mg total)  into the muscle as needed for anaphylaxis. 2 Device 1  . famotidine (PEPCID) 10 MG tablet Take 20 mg by mouth at bedtime.     . furosemide (LASIX) 40 MG tablet Partient take 40 mg tablet by mouth every other day    . furosemide (LASIX) 80 MG tablet Patient take 80 mg by mouth every other day    . hydrOXYzine (ATARAX/VISTARIL) 10 MG tablet Take 10 mg by mouth at bedtime.     . isosorbide mononitrate (IMDUR) 30 MG 24 hr tablet TAKE 1 TABLET ONCE DAILY. 90 tablet 0  . Ketotifen Fumarate (ZADITOR OP) Apply 1 drop to eye daily as needed (itchy eyes).     [name] levothyroxine (SYNTHROID)  100 MCG tablet TAKE 1 TABLET ONCE DAILY. 90 tablet 0  . meclizine (ANTIVERT) 25 MG tablet Take 1 tablet (25 mg total) by mouth every 4 (four) hours as needed for dizziness. 60 tablet 2  . metoprolol succinate (TOPROL-XL) 50 MG 24 hr tablet Take 1.5 tablets (75 mg total) by mouth daily. Take with or immediately following a meal. 135 tablet 3  . montelukast (SINGULAIR) 10 MG tablet TAKE 1 TABLET ONCE DAILY IN THE EVENING. 90 tablet 3  . Multiple Vitamins-Minerals (PRESERVISION AREDS PO) Take 2 tablets by mouth every morning.    [name] omeprazole (PRILOSEC) 20 MG capsule TAKE (1) CAPSULE TWICE DAILY. 180 capsule 3  . potassium bicarbonate (K-LYTE) 25 MEQ disintegrating tablet Take 25 mEq by mouth 2 (two) times daily.    . potassium chloride SA (KLOR-CON) 20 MEQ tablet Take 20 mEq by mouth every other day.    . sacubitril-valsartan (ENTRESTO) 97-103 MG Take 1 tablet by mouth 2 (two) times daily. 180 tablet 3  . spironolactone (ALDACTONE) 25 MG tablet Take 1 tablet (25 mg total) by mouth daily. 90 tablet 3  . triamcinolone cream (KENALOG) 0.1 % Apply 1 application topically daily as needed for dry skin.    . valACYclovir (VALTREX) 500 MG tablet Take 500 mg by mouth 2 (two) times daily as needed (cold sores).     No current facility-administered medications for this visit.    Allergies:   Peanut-containing drug products,  Sulfonamide derivatives, Amlodipine, Eliquis [apixaban], Lisinopril, and Xarelto [rivaroxaban]   Social History:  The patient  reports that he quit smoking about 58 years ago. His smoking use included cigarettes. He has a 45.50 pack-year smoking history. He has never used smokeless tobacco. He reports current alcohol use. He reports that he does not use drugs.   Family History:  The patient's family history includes Allergic rhinitis in his father; Asthma in his father; Breast cancer in his mother; Heart attack (age of onset: 47) in his father; Heart failure (age of onset: 30) in his mother; Uterine cancer in his mother.  ROS:  Please see the history of present illness.    All other systems are reviewed and otherwise negative.   PHYSICAL EXAM:  VS:  BP (!) 90/50   Pulse 62   Ht 5' 6.5" (1.689 m)   Wt 271 lb (122.9 kg)   SpO2 98%   BMI 43.09 kg/m  BMI: Body mass index is 43.09 kg/m. Well nourished, well developed, in no acute distress HEENT: normocephalic, atraumatic Neck: no JVD, carotid bruits or masses Cardiac:  RRR; no significant murmurs, no rubs, or gallops Lungs:  CTA b/l, no wheezing, rhonchi or rales Abd: soft, nontender, obese MS: no deformity or atrophy Ext: trace-1+ edema Skin: warm and dry, no rash Neuro:  No gross deficits appreciated Psych: euthymic mood, full affect  ICD site is stable, no tethering or discomfort   EKG:  Done today, reviewe with dr. Angelena Form SR/VPacing, AVpaced beat  Device interrogation done today and reviewed by myself:  Battery and lead measurements are good 4 NSVT only AFib burden <0.1% OptiVol looks good 83.7% effective BP     September 2020 TTE: 1. The left ventricle has moderately reduced systolic function, with an ejection fraction of 35-40%. The cavity size was normal. There is moderately increased left ventricular wall thickness. Left ventricular diastolic Doppler parameters are consistent  with impaired relaxation.  Indeterminate filling pressures. 2. Severe hypokinesis of the left ventricular, entire anterior wall, anteroseptal wall and  apical segment. 3. The right ventricle has moderately reduced systolic function. The cavity was normal. There is no increase in right ventricular wall thickness. 4. Left atrial size was mildly dilated. 5. Right atrial size was mildly dilated. 6. The mitral valve is abnormal. Mild thickening of the mitral valve leaflet. There is mild to moderate mitral annular calcification present. 7. The tricuspid valve is grossly normal. 8. A 94mm an Wende Crease Sapien bioprosthetic aortic valve (TAVR) valve is present in the aortic position. Procedure Date: 03/13/2019 Normal aortic valve prosthesis. 9. No stenosis of the aortic valve. 10. The aorta is abnormal unless otherwise noted. 11. There is mild dilatation of the ascending aorta measuring 40 mm. 12. When compared to the prior study: 02/06/2018: LVEF 40-45%, TAVR mean gradient 9 mmHg, aorta 3.9 cm.   Cardiac cath 08/27/16  There is moderate aortic valve stenosis.  SVG graft was visualized by angiography and is normal in caliber.  1st Mrg lesion, 65 %stenosed.  Ost LM to LM lesion, 100 %stenosed.  SVG graft was visualized by angiography and is normal in caliber.  LIMA graft was visualized by angiography and is normal in caliber.  Prox RCA to Mid RCA lesion, 100 %stenosed.  SVG graft was visualized by angiography and is normal in caliber and anatomically normal.  Hemodynamic findings consistent with mild pulmonary hypertension.   1. 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  2. The Left main is occluded at the ostium.  3. The LAD fills from the patent IMA graft and the Diagonal fills from the patent vein graft 4. The Circumflex fills from the patent vein graft to the OM. There is a moderate stenosis in the proximal segment of the OM branch  proximal to the insertion of the vein graft.  5. The distal RCA fills from the patent vein graft.  6. Moderate AS 7. Elevated filling pressures  Recommendations: Will continue medical management of CAD. Will start Lasix 20 mg daily for mild volume overload. No indication for AVR at this time.   Diagnostic Diagram           Recent Labs: 03/21/2020: ALT 18; Hemoglobin 14.1; Platelets 201; TSH 3.55 04/04/2020: BUN 27; Creatinine, Ser 1.18; Potassium 5.1; Sodium 134  03/21/2020: Cholesterol 125; HDL 30; LDL Cholesterol (Calc) 68; Total CHOL/HDL Ratio 4.2; Triglycerides 206   CrCl cannot be calculated (Patient's most recent lab result is older than the maximum 21 days allowed.).   Wt Readings from Last 3 Encounters:  07/12/20 271 lb (122.9 kg)  07/12/20 271 lb (122.9 kg)  07/11/20 269 lb 3.2 oz (122.1 kg)     Other studies reviewed: Additional studies/records reviewed today include: summarized above  ASSESSMENT AND PLAN:  1. ICD     Intact function, no programming changes made  2. Paroxysmal AFib     CHA2DS2Vasc is 5, on Pradaxa, appropriately dosed     Burden <1%       3. ICM 4. Chronic CHF (systolic)     No symptoms or exam findings to suggest volume OL, he has minimal edema that he reports unchanged for years     OptiVol looks good     On BB. Entresto, furosemide, aldactone, nitrate     Only BP 84%, observed some PVCs during his interrogation     Discussed with dr. Julianne Handler who is seeing him today as well, BP does not alow room for titration of BB,  given clinically doing well, not likely to plan for changes today (he was going to see him shortly)     C/w Dr. Angelena Form   5. CAD     No symptoms       On BB, ASA, statin, nitrate     C/w Dr. Angelena Form        Disposition: F/u with remotes Q 30mo and in clinic in a year, sooner if needed  Current medicines are reviewed at length with the patient today.  The patient did not have any concerns regarding  medicines.  Venetia Night, PA-C 07/12/2020 2:52 PM     [address] [place] [address] [phone] (office)  [phone] (fax)

## 2020-07-11 NOTE — Progress Notes (Signed)
   Subjective:    Patient ID: Justin [name], male    DOB: [birth year], 86 y.o.   MRN: [id number]  HPI Here to cover some items before he leaves for [place] to spend the winter. He has had cold hands for the past few months. They are not painful and they do not change colors. Warming them up makes this stop. Also he asks for some Zpacks and a steroid pack to take with him in case he gets a spell of bronchitis or a gout flare.    Review of Systems  Constitutional: Negative.   Respiratory: Negative.   Cardiovascular: Negative.        Objective:   Physical Exam Constitutional:      Appearance: Normal appearance.  Cardiovascular:     Rate and Rhythm: Normal rate and regular rhythm.     Pulses: Normal pulses.     Heart sounds: Normal heart sounds.     Comments: Distal pulses in the hands are full, capillary refill is normal  Pulmonary:     Effort: Pulmonary effort is normal.     Breath sounds: Normal breath sounds.  Neurological:     Mental Status: He is alert.           Assessment & Plan:  He is doing well in general. 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 We will supply him medications as above. We will give him some Meclizine in case the vertigo acts up.  [name], MD

## 2020-07-11 NOTE — Progress Notes (Signed)
Remote ICD transmission.   

## 2020-07-12 ENCOUNTER — Ambulatory Visit: Payer: Medicare Other | Admitting: Physician Assistant

## 2020-07-12 ENCOUNTER — Ambulatory Visit: Payer: Medicare Other | Admitting: Cardiovascular Disease

## 2020-07-12 ENCOUNTER — Encounter: Payer: Self-pay | Admitting: Cardiovascular Disease

## 2020-07-12 ENCOUNTER — Encounter: Payer: Self-pay | Admitting: Physician Assistant

## 2020-07-12 VITALS — BP 98/50 | HR 62 | Ht 66.5 in | Wt 271.0 lb

## 2020-07-12 VITALS — BP 90/50 | HR 62 | Ht 66.5 in | Wt 271.0 lb

## 2020-07-12 DIAGNOSIS — I10 Essential (primary) hypertension: Secondary | ICD-10-CM | POA: Diagnosis not present

## 2020-07-12 DIAGNOSIS — Z95810 Presence of automatic (implantable) cardiac defibrillator: Secondary | ICD-10-CM | POA: Diagnosis not present

## 2020-07-12 DIAGNOSIS — I5042 Chronic combined systolic (congestive) and diastolic (congestive) heart failure: Secondary | ICD-10-CM | POA: Diagnosis not present

## 2020-07-12 DIAGNOSIS — I480 Paroxysmal atrial fibrillation: Secondary | ICD-10-CM | POA: Diagnosis not present

## 2020-07-12 DIAGNOSIS — I255 Ischemic cardiomyopathy: Secondary | ICD-10-CM

## 2020-07-12 DIAGNOSIS — I2510 Atherosclerotic heart disease of native coronary artery without angina pectoris: Secondary | ICD-10-CM

## 2020-07-12 DIAGNOSIS — I25810 Atherosclerosis of coronary artery bypass graft(s) without angina pectoris: Secondary | ICD-10-CM

## 2020-07-12 NOTE — Patient Instructions (Signed)
Medication Instructions:  No changes  *If you need a refill on your cardiac medications before your next appointment, please call your pharmacy*   Lab Work: none If you have labs (blood work) drawn today and your tests are completely normal, you will receive your results only by: . MyChart Message (if you have MyChart) OR . A paper copy in the mail If you have any lab test that is abnormal or we need to change your treatment, we will call you to review the results.   Testing/Procedures: none   Follow-Up: At [facility], you and your health needs are our priority.  As part of our continuing mission to provide you with exceptional heart care, we have created designated Provider Care Teams.  These Care Teams include your primary Cardiologist (physician) and Advanced Practice Providers (APPs -  Physician Assistants and Nurse Practitioners) who all work together to provide you with the care you need, when you need it.  Your next appointment:   6 month(s)  The format for your next appointment:   In Person  Provider:   You may see Christopher McAlhany, MD or one of the following Advanced Practice Providers on your designated Care Team:    Dayna Dunn, PA-C  Michele Lenze, PA-C    Other Instructions   

## 2020-07-12 NOTE — Progress Notes (Signed)
Chief Complaint  Patient presents with  . Follow-up    CAD   History of Present Illness: 86 yo male with history of HTN, HLD, severe aortic stenosis s/p TAVR, PAF, chronic diastolic CHF and CAD s/p 4V CABG, syncope, junctional bradycardia, PVCS, non-sustained VT and LBBB s/p ICD implantationwho is here today for cardiac follow up. He is known to have CAD s/p 4V CABG in March 2007 (LIMA to LAD, SVG to Diagonal, SVG to OM, SVG to RCA). Last cardiac cath in January 2018 with 4/4 patent bypass grafts. He had progression of his aortic stenosis with worsened dyspnea and acute on chronic CHF in the spring of 2018. Echo May 2018 with mildly reduced LV systolic function, SEGB=15-17%. His aortic stenosis was severe. He underwent TAVR on 03/04/17 with a 29 mm Edwards Sapien 3 valve from the right transfemoral approach. He developed atrial fibrillation following his procedure and was placed on IV amiodarone with conversion to sinus rhythm. Follow up visit in our office 03/19/17 and pt noted to have irregular rhythm with intraventricular conduction delay and was felt by our EP team to represent 2:1 conduction of competing fast and slow pathways. Cardiac monitor with sinus rhythm with periods of junctional rhythm, frequent PVCs, frequent PACs and runs of a wide complex tachycardia. ICD was implanted on 05/03/17. Echo September 2020 with LVEF=35-40%. He has since been changed from Losartan to [facility]. Carotid artery dopplers March 2021 with mld bilateral carotid artery disease.   He is here today for follow up. The patient denies any chest pain, dyspnea, palpitations, lower extremity edema, orthopnea, PND, dizziness, near syncope or syncope.   Primary Care Physician: Laurey Morale, MD  Past Medical History:  Diagnosis Date  . Age-related macular degeneration, dry, both eyes   . Allergic    "24/7; 365 days/year; I'm allergic to pollens, dust, all southern grasses/trees, mold, mildue, cats, dogs" (01/07/2017)  .  Anal fissure   . Asthma    sees [name]   . Benign prostatic hypertrophy    (sees Dr. Denman George  . CAD (coronary artery disease)    a. s/p CABG 2007. b. Cath 08/2016 - 4/4 patent grafts.  . Carotid bruit    carotid u/s 10/10: 0.39% bilaterally  . Chronic combined systolic and diastolic CHF (congestive heart failure) ([place])   . Complication of anesthesia 1980s   "w/anal cyst OR, he gave me a saddle block then put a narcotic in spinal cord; had a severe reaction to that" (01/07/2017)  . Congestive heart failure (CHF) ([place])   . ED (erectile dysfunction)   . Family history of adverse reaction to anesthesia    "daughter wakes up during OR" (01/07/2017)  . GERD (gastroesophageal reflux disease)   . Gout   . HTN (hypertension)   . Hx of colonic polyps    (sees Dr. Henrene Pastor)  . Hyperlipidemia   . Hypothyroidism   . Moderate to severe aortic stenosis    a. s/p TAVR 02/2017.  [name] Myocardial infarction ([place]) ~ 2000  . Obesity   . Osteoarthritis    "was in my knees, hands" (01/07/2017 )  . PAF (paroxysmal atrial fibrillation) ([place])    a. documented post TAVR.  [name] Precancerous skin lesion    (sees Dr. Allyson Sabal)  . Prostate cancer ([place]) dx'd ~ 2014  . 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   29 mm Edwards Sapien 3 transcatheter heart valve placed via percutaneous right transfemoral  approach    Past Surgical History:  Procedure Laterality Date  . ANUS SURGERY     "opened it back up cause it wouldn't heal; wound up w/a fissure" (01/07/2017)  . BIV ICD INSERTION CRT-D N/A 05/02/2017   Procedure: BIV ICD INSERTION CRT-D;  Surgeon: Deboraha Sprang, MD;  Location: [place] [facility];  Service: Cardiovascular;  Laterality: N/A;  . CARDIAC CATHETERIZATION  10/29/2005  . CARDIAC CATHETERIZATION N/A 08/27/2016   Procedure: Right/Left Heart Cath and Coronary/Graft Angiography;  Surgeon: Burnell Blanks, MD;  Location: [place] [facility];  Service:  Cardiovascular;  Laterality: N/A;  . CATARACT EXTRACTION W/ INTRAOCULAR LENS  IMPLANT, BILATERAL Bilateral   . CATARACT EXTRACTION, BILATERAL  2012  . COLONOSCOPY  06/30/2008   no repeats needed   . COLONOSCOPY     had 3 or 4 in the past   . CORONARY ARTERY BYPASS GRAFT  2007   "CABG X4"  . CYST EXCISION PERINEAL  1980s  . HAMMER TOE SURGERY Bilateral   . JOINT REPLACEMENT    . KNEE ARTHROPLASTY  07/30/2011   Procedure: COMPUTER ASSISTED TOTAL KNEE ARTHROPLASTY;  Surgeon: Meredith Pel;  Location: [facility];  Service: Orthopedics;  Laterality: Left;  left total knee arthroplasty  . MASTECTOMY SUBCUTANEOUS Bilateral   . MULTIPLE TOOTH EXTRACTIONS    . ORIF FINGER / THUMB FRACTURE Right ~ 1980   "repair of thumb injury"  . PROSTATE BIOPSY    . REPLACEMENT TOTAL KNEE BILATERAL Bilateral 2012  . TEE WITHOUT CARDIOVERSION N/A 03/04/2017   Procedure: TRANSESOPHAGEAL ECHOCARDIOGRAM (TEE);  Surgeon: Burnell Blanks, MD;  Location: [place];  Service: Open Heart Surgery;  Laterality: N/A;  . TOTAL KNEE REVISION Right 05/18/2019   Procedure: RIGHT PATELLA REVISION/REMOVAL;  Surgeon: Meredith Pel, MD;  Location: [facility];  Service: Orthopedics;  Laterality: Right;  . TRANSCATHETER AORTIC VALVE REPLACEMENT, TRANSFEMORAL N/A 03/04/2017   Procedure: TRANSCATHETER AORTIC VALVE REPLACEMENT, TRANSFEMORAL;  Surgeon: Burnell Blanks, MD;  Location: [facility];  Service: Open Heart Surgery;  Laterality: N/A;    Current Outpatient Medications  Medication Sig Dispense Refill  . aspirin 81 MG tablet Take 1 tablet (81 mg total) by mouth daily. 30 tablet 0  . atorvastatin (LIPITOR) 20 MG tablet TAKE 1 TABLET ONCE DAILY. 90 tablet 0  . azelastine (ASTELIN) 0.1 % nasal spray Place 2 sprays into both nostrils 2 (two) times daily. Use in each nostril as directed 90 mL 3  . Azelastine HCl 0.15 % SOLN Place 2 sprays into the nose in the morning and at bedtime. 30 mL 11  . benzonatate (TESSALON) 200 MG  capsule Take 1 capsule (200 mg total) by mouth 2 (two) times daily as needed for cough. 180 capsule 3  . cetirizine (ZYRTEC) 10 MG tablet Take 10 mg by mouth daily.    . chlorpheniramine (CHLOR-TRIMETON) 4 MG tablet Take 2 tablets (8 mg total) by mouth at bedtime. 60 tablet 12  . dabigatran (PRADAXA) 150 MG CAPS capsule TAKE (1) CAPSULE TWICE DAILY. 180 capsule 3  . diphenhydrAMINE (DIPHENHIST) 25 MG tablet Take 25 mg by mouth at bedtime.     [name] EPINEPHrine 0.3 mg/0.3 mL IJ SOAJ injection Inject 0.3 mLs (0.3 mg total) into the muscle as needed for anaphylaxis. 2 Device 1  . famotidine (PEPCID) 10 MG tablet Take 20 mg by mouth at bedtime.     . furosemide (LASIX) 40 MG tablet Partient take 40 mg tablet by mouth every other  day    . furosemide (LASIX) 80 MG tablet Patient take 80 mg by mouth every other day    . hydrOXYzine (ATARAX/VISTARIL) 10 MG tablet Take 10 mg by mouth at bedtime.     . isosorbide mononitrate (IMDUR) 30 MG 24 hr tablet TAKE 1 TABLET ONCE DAILY. 90 tablet 0  . Ketotifen Fumarate (ZADITOR OP) Apply 1 drop to eye daily as needed (itchy eyes).     [name] levothyroxine (SYNTHROID) 100 MCG tablet TAKE 1 TABLET ONCE DAILY. 90 tablet 0  . meclizine (ANTIVERT) 25 MG tablet Take 1 tablet (25 mg total) by mouth every 4 (four) hours as needed for dizziness. 60 tablet 2  . metoprolol succinate (TOPROL-XL) 50 MG 24 hr tablet Take 1.5 tablets (75 mg total) by mouth daily. Take with or immediately following a meal. 135 tablet 3  . montelukast (SINGULAIR) 10 MG tablet TAKE 1 TABLET ONCE DAILY IN THE EVENING. 90 tablet 3  . Multiple Vitamins-Minerals (PRESERVISION AREDS PO) Take 2 tablets by mouth every morning.    [name] omeprazole (PRILOSEC) 20 MG capsule TAKE (1) CAPSULE TWICE DAILY. 180 capsule 3  . potassium bicarbonate (K-LYTE) 25 MEQ disintegrating tablet Take 25 mEq by mouth 2 (two) times daily.    . potassium chloride SA (KLOR-CON) 20 MEQ tablet Take 20 mEq by mouth every other day.    .  sacubitril-valsartan (ENTRESTO) 97-103 MG Take 1 tablet by mouth 2 (two) times daily. 180 tablet 3  . spironolactone (ALDACTONE) 25 MG tablet Take 1 tablet (25 mg total) by mouth daily. 90 tablet 3  . triamcinolone cream (KENALOG) 0.1 % Apply 1 application topically daily as needed for dry skin.    . valACYclovir (VALTREX) 500 MG tablet Take 500 mg by mouth 2 (two) times daily as needed (cold sores).     No current facility-administered medications for this visit.    Allergies  Allergen Reactions  . Peanut-Containing Drug Products Anaphylaxis  . Sulfonamide Derivatives Anaphylaxis  . Amlodipine Swelling    Swelling in ankles  . Eliquis [Apixaban] Other (See Comments)    Back/hip pain  . Lisinopril Cough    cough  . Xarelto [Rivaroxaban] Other (See Comments)    Back/hip pain    Social History   Socioeconomic History  . Marital status: Married    Spouse name: Not on file  . Number of children: 3  . Years of education: Not on file  . Highest education level: Not on file  Occupational History  . Occupation: Clinical biochemist    Comment: builds malls  Tobacco Use  . Smoking status: Former Smoker    Packs/day: 3.50    Years: 13.00    Pack years: 45.50    Types: Cigarettes    Quit date: 1963    Years since quitting: 58.9  . Smokeless tobacco: Never Used  Vaping Use  . Vaping Use: Never used  Substance and Sexual Activity  . Alcohol use: Yes    Alcohol/week: 0.0 standard drinks    Comment: 01/07/2017 "might have 2 beers 2-3 times/month"  . Drug use: No  . Sexual activity: Never  Other Topics Concern  . Not on file  Social History Narrative   FH of CAD, Male 1st degree relative less than age 84.   Social Determinants of Health   Financial Resource Strain: Low Risk   . Difficulty of Paying Living Expenses: Not hard at all  Food Insecurity:   . Worried About Charity fundraiser in the  Last Year: Not on file  . Ran Out of Food in the Last Year: Not on file    Transportation Needs: No Transportation Needs  . Lack of Transportation (Medical): No  . Lack of Transportation (Non-Medical): No  Physical Activity:   . Days of Exercise per Week: Not on file  . Minutes of Exercise per Session: Not on file  Stress:   . Feeling of Stress : Not on file  Social Connections:   . Frequency of Communication with Friends and Family: Not on file  . Frequency of Social Gatherings with Friends and Family: Not on file  . Attends Religious Services: Not on file  . Active Member of Clubs or Organizations: Not on file  . Attends Archivist Meetings: Not on file  . Marital Status: Not on file  Intimate Partner Violence:   . Fear of Current or Ex-Partner: Not on file  . Emotionally Abused: Not on file  . Physically Abused: Not on file  . Sexually Abused: Not on file    Family History  Problem Relation Age of Onset  . Heart attack Father 55  . Allergic rhinitis Father   . Asthma Father   . Heart failure Mother 52  . Uterine cancer Mother   . Breast cancer Mother   . Colon cancer Neg Hx   . Esophageal cancer Neg Hx     Review of Systems:  As stated in the HPI and otherwise negative.   BP (!) 98/50   Pulse 62   Ht 5' 6.5" (1.689 m)   Wt 271 lb (122.9 kg)   SpO2 98%   BMI 43.09 kg/m   Physical Examination: General: Well developed, well nourished, NAD  HEENT: OP clear, mucus membranes moist  SKIN: warm, dry. No rashes. Neuro: No focal deficits  Musculoskeletal: Muscle strength 5/5 all ext  Psychiatric: Mood and affect normal  Neck: No JVD, no carotid bruits, no thyromegaly, no lymphadenopathy.  Lungs:Clear bilaterally, no wheezes, rhonci, crackles Cardiovascular: Regular rate and rhythm. No murmurs, gallops or rubs. Abdomen:Soft. Bowel sounds present. Non-tender.  Extremities: No lower extremity edema. Pulses are 2 + in the bilateral DP/PT.  Echo September 2020:  1. The left ventricle has moderately reduced systolic function, with  an ejection fraction of 35-40%. The cavity size was normal. There is moderately increased left ventricular wall thickness. Left ventricular diastolic Doppler parameters are consistent  with impaired relaxation. Indeterminate filling pressures.  2. Severe hypokinesis of the left ventricular, entire anterior wall, anteroseptal wall and apical segment.  3. The right ventricle has moderately reduced systolic function. The cavity was normal. There is no increase in right ventricular wall thickness.  4. Left atrial size was mildly dilated.  5. Right atrial size was mildly dilated.  6. The mitral valve is abnormal. Mild thickening of the mitral valve leaflet. There is mild to moderate mitral annular calcification present.  7. The tricuspid valve is grossly normal.  8. A 44mm an Wende Crease Sapien bioprosthetic aortic valve (TAVR) valve is present in the aortic position. Procedure Date: 03/13/2019 Normal aortic valve prosthesis.  9. No stenosis of the aortic valve. 10. The aorta is abnormal unless otherwise noted. 11. There is mild dilatation of the ascending aorta measuring 40 mm. 12. When compared to the prior study: 02/06/2018: LVEF 40-45%, TAVR mean gradient 9 mmHg, aorta 3.9 cm.   Cardiac cath 08/27/16: Diagnostic Diagram          EKG:  EKG is ordered today The  ekg ordered today demonstrates  Sinus, AV pacing, PVCs  Recent Labs: 03/21/2020: ALT 18; Hemoglobin 14.1; Platelets 201; TSH 3.55 04/04/2020: BUN 27; Creatinine, Ser 1.18; Potassium 5.1; Sodium 134   Lipid Panel    Component Value Date/Time   CHOL 125 03/21/2020 0943   CHOL 171 08/11/2018 0735   TRIG 206 (H) 03/21/2020 0943   TRIG 143 06/30/2006 1132   HDL 30 (L) 03/21/2020 0943   HDL 34 (L) 08/11/2018 0735   CHOLHDL 4.2 03/21/2020 0943   VLDL 34.0 03/11/2019 0858   LDLCALC 68 03/21/2020 0943     Wt Readings from Last 3 Encounters:  07/12/20 271 lb (122.9 kg)  07/12/20 271 lb (122.9 kg)  07/11/20 269 lb 3.2 oz (122.1  kg)     Other studies Reviewed: Additional studies/ records that were reviewed today include:  Review of the above records demonstrates:    Assessment and Plan:   1. CAD s/p CABG without angina: No chest pain suggestive of angina. Will continue ASA, statin and beta blocker.       2. Aortic valve stenosis: He is s/p TAVR in July 2018. Will continue SBE prophylaxis as indicated. He is on Pradaxa.   3. HTN: BP is well controlled. No changes  4. HLD: LDL at goal in July 2021. Continue statin.   5. Chronic systolic and diastolic CHF: Weight is stable. No volume overload on exam. Continue Lasix.    6. Atrial fibrillation, paroxysmal: He is in sinus today. Continue beta blocker and Pradaxa.    7. Ischemic cardiomyopathy: ICD in place. Continue Toprol and Entresto.   8. Carotid artery disease:  Mild bilateral carotid artery disease by dopplers March 2021.   Current medicines are reviewed at length with the patient today.  The patient does not have concerns regarding medicines.  The following changes have been made:  no change  Labs/ tests ordered today include:   No orders of the defined types were placed in this encounter.  Disposition:  Follow up with me in 6 months.   Signed, [name], MD 07/12/2020 3:23 PM    [facility] [address], [place], [place]  [address] Phone: [phone]; Fax: [phone]

## 2020-07-12 NOTE — Patient Instructions (Addendum)
Medication Instructions:   Your physician recommends that you continue on your current medications as directed. Please refer to the Current Medication list given to you today.    *If you need a refill on your cardiac medications before your next appointment, please call your pharmacy*   Lab Work: [place]   If you have labs (blood work) drawn today and your tests are completely normal, you will receive your results only by: [name] MyChart Message (if you have MyChart) OR . A paper copy in the mail If you have any lab test that is abnormal or we need to change your treatment, we will call you to review the results.   Testing/Procedures: NONE ORDERED  TODAY   Follow-Up: At [facility], you and your health needs are our priority.  As part of our continuing mission to provide you with exceptional heart care, we have created designated Provider Care Teams.  These Care Teams include your primary Cardiologist (physician) and Advanced Practice Providers (APPs -  Physician Assistants and Nurse Practitioners) who all work together to provide you with the care you need, when you need it.  We recommend signing up for the patient portal called "MyChart".  Sign up information is provided on this After Visit Summary.  MyChart is used to connect with patients for Virtual Visits (Telemedicine).  Patients are able to view lab/test results, encounter notes, upcoming appointments, etc.  Non-urgent messages can be sent to your provider as well.   To learn more about what you can do with MyChart, go to [email or web address].    Your next appointment:   1 year  The format for your next appointment:   In Person  Provider:   You may see Dr. [name] or one of the following Advanced Practice Providers on your designated Care Team:    Chanetta Marshall, NP  Tommye Standard, PA-C  Legrand Como "Oda [place]ts, [place]    Other Instructions

## 2020-07-17 ENCOUNTER — Ambulatory Visit (INDEPENDENT_AMBULATORY_CARE_PROVIDER_SITE_OTHER): Payer: Medicare Other

## 2020-07-17 DIAGNOSIS — J309 Allergic rhinitis, unspecified: Secondary | ICD-10-CM

## 2020-07-24 DIAGNOSIS — H353221 Exudative age-related macular degeneration, left eye, with active choroidal neovascularization: Secondary | ICD-10-CM | POA: Diagnosis not present

## 2020-07-31 ENCOUNTER — Ambulatory Visit (INDEPENDENT_AMBULATORY_CARE_PROVIDER_SITE_OTHER): Payer: Medicare Other

## 2020-07-31 DIAGNOSIS — Z95810 Presence of automatic (implantable) cardiac defibrillator: Secondary | ICD-10-CM | POA: Diagnosis not present

## 2020-07-31 DIAGNOSIS — I5042 Chronic combined systolic (congestive) and diastolic (congestive) heart failure: Secondary | ICD-10-CM

## 2020-07-31 NOTE — Progress Notes (Addendum)
EPIC Encounter for ICM Monitoring  Patient Name: Justin [name] is a 86 y.o. male Date: 07/31/2020 Primary Care Physican: Laurey Morale, MD Primary Cardiologist:McAlhany Electrophysiologist:Klein Bi-V Pacing: 75.2%  11/172021 OfficeWeight: 271lbs   Spoke with patient and reports feeling well at this time. Denies fluid symptoms. He is currently in [place] for 3 months.   He did not take Furosemide for a couple of days when traveling and took a couple of extra dosages in [place] thoracic impedancesuggesting possible fluid accumulation which has returned close to baseline normal.  Prescribed:   Furosemide80 mgtaketake0.5tablet (40 mg total) by mouth daily.Per [name] pharmacist, he may take extra 20-40 mg as needed for weight gain/swelling.  KLC 20 mEq take 1 tablet daily  Spironolactone 25 mg take 1 tablet daily  Recommendations:No changes and encouraged to call if experiencing any fluid symptoms.  Follow-up plan: ICM clinic phone appointment on1/02/2021. 91 day device clinic remote transmission2/14/2022.  EP/Cardiology Office Visits:Recall 01/08/2021 with Dr Angelena Form.  Recall 07/12/2021 with Dr [name]  Copy of ICM check sent to [facility]  3 month ICM trend: 07/31/2020    1 Year ICM trend:       [name], RN 07/31/2020 1:40 PM

## 2020-09-01 ENCOUNTER — Ambulatory Visit (INDEPENDENT_AMBULATORY_CARE_PROVIDER_SITE_OTHER): Payer: Medicare Other

## 2020-09-01 DIAGNOSIS — I5042 Chronic combined systolic (congestive) and diastolic (congestive) heart failure: Secondary | ICD-10-CM | POA: Diagnosis not present

## 2020-09-01 DIAGNOSIS — Z95810 Presence of automatic (implantable) cardiac defibrillator: Secondary | ICD-10-CM

## 2020-09-04 NOTE — Progress Notes (Signed)
EPIC Encounter for ICM Monitoring  Patient Name: Justin [name] is a 87 y.o. male Date: 09/04/2020 Primary Care Physican: Laurey Morale, MD Primary Cardiologist:McAlhany Electrophysiologist:Klein Bi-V Pacing: 56.9 % ( was 75.2% on 07/31/2020 report) 11/17/2021OfficeWeight: 271lbs   Spoke with patient and reports feeling well at this time.  Denies fluid symptoms.    Optivol thoracic impedancesuggesting normal fluid levels.  Message sent to device clinic regarding decrease in BiV Pacing.  Prescribed:   Furosemide80 mgtaketake0.5tablet (40 mg total) by mouth daily.Per [name] pharmacist, he may take extra 20-40 mg as needed for weight gain/swelling.  KLC 20 mEq take 1 tablet daily  Spironolactone 25 mg take 1 tablet daily  Recommendations: No changes and encouraged to call if experiencing any fluid symptoms.  Follow-up plan: ICM clinic phone appointment on2/15/2022. 91 day device clinic remote transmission2/14/2022.  EP/Cardiology Office Visits:Recall 01/08/2021 with Dr Angelena Form.  Recall 07/12/2021 with Dr [name]  Copy of ICM check sent to [place]     3 month ICM trend: 09/01/2020.    1 Year ICM trend:       [name], RN 09/04/2020 12:57 PM

## 2020-10-08 LAB — CUP PACEART REMOTE DEVICE CHECK
Battery Remaining Longevity: 23 mo
Battery Voltage: 2.94 V
Brady Statistic AP VP Percent: 8.6 %
Brady Statistic AP VS Percent: 1.48 %
Brady Statistic AS VP Percent: 79.05 %
Brady Statistic AS VS Percent: 10.87 %
Brady Statistic RA Percent Paced: 8.83 %
Brady Statistic RV Percent Paced: 63.73 %
Date Time Interrogation Session: 20220211093524
HighPow Impedance: 75 Ohm
Implantable Lead Implant Date: 20180907
Implantable Lead Implant Date: 20180907
Implantable Lead Implant Date: 20180907
Implantable Lead Location: 753858
Implantable Lead Location: 753859
Implantable Lead Location: 753860
Implantable Lead Model: 4396
Implantable Lead Model: 5076
Implantable Pulse Generator Implant Date: 20180907
Lead Channel Impedance Value: 323 Ohm
Lead Channel Impedance Value: 342 Ohm
Lead Channel Impedance Value: 380 Ohm
Lead Channel Impedance Value: 456 Ohm
Lead Channel Impedance Value: 494 Ohm
Lead Channel Impedance Value: 836 Ohm
Lead Channel Pacing Threshold Amplitude: 0.625 V
Lead Channel Pacing Threshold Amplitude: 0.875 V
Lead Channel Pacing Threshold Amplitude: 1.25 V
Lead Channel Pacing Threshold Pulse Width: 0.4 ms
Lead Channel Pacing Threshold Pulse Width: 0.4 ms
Lead Channel Pacing Threshold Pulse Width: 1 ms
Lead Channel Sensing Intrinsic Amplitude: 15.375 mV
Lead Channel Sensing Intrinsic Amplitude: 15.375 mV
Lead Channel Sensing Intrinsic Amplitude: 2.125 mV
Lead Channel Sensing Intrinsic Amplitude: 2.125 mV
Lead Channel Setting Pacing Amplitude: 1.5 V
Lead Channel Setting Pacing Amplitude: 2 V
Lead Channel Setting Pacing Amplitude: 2.5 V
Lead Channel Setting Pacing Pulse Width: 0.4 ms
Lead Channel Setting Pacing Pulse Width: 1 ms
Lead Channel Setting Sensing Sensitivity: 0.3 mV

## 2020-10-09 ENCOUNTER — Ambulatory Visit (INDEPENDENT_AMBULATORY_CARE_PROVIDER_SITE_OTHER): Payer: Medicare Other

## 2020-10-09 DIAGNOSIS — I5042 Chronic combined systolic (congestive) and diastolic (congestive) heart failure: Secondary | ICD-10-CM

## 2020-10-09 DIAGNOSIS — I255 Ischemic cardiomyopathy: Secondary | ICD-10-CM

## 2020-10-10 ENCOUNTER — Ambulatory Visit (INDEPENDENT_AMBULATORY_CARE_PROVIDER_SITE_OTHER): Payer: Medicare Other

## 2020-10-10 DIAGNOSIS — Z95810 Presence of automatic (implantable) cardiac defibrillator: Secondary | ICD-10-CM

## 2020-10-10 DIAGNOSIS — I5042 Chronic combined systolic (congestive) and diastolic (congestive) heart failure: Secondary | ICD-10-CM

## 2020-10-13 NOTE — Progress Notes (Signed)
Remote ICD transmission.   

## 2020-10-20 ENCOUNTER — Ambulatory Visit: Payer: Medicare Other | Admitting: Cardiovascular Disease

## 2020-10-20 NOTE — Progress Notes (Signed)
EPIC Encounter for ICM Monitoring  Patient Name: Justin [name] is a 87 y.o. male Date: 10/20/2020 Primary Care Physican: Laurey Morale, MD Primary Cardiologist:McAlhany Electrophysiologist:Klein Bi-V Pacing:64 %  11/17/2021OfficeWeight: 271lbs     Transmission reviewed.  Optivol thoracic impedancesuggestingnormal fluid levels.   Prescribed:   Furosemide80 mgtaketake0.5tablet (40 mg total) by mouth daily.Per [name] pharmacist, he may take extra 20-40 mg as needed for weight gain/swelling.  KLC 20 mEq take 1 tablet daily  Spironolactone 25 mg take 1 tablet daily  Recommendations: No changes.  Follow-up plan: ICM clinic phone appointment on3/21/2022. 91 day device clinic remote transmission5/16/2022.  EP/Cardiology Office Visits:Recall 01/08/2021 with Dr Angelena Form. Recall 07/12/2021 with Dr [name]  Copy of ICM check sent to [place]   3 month ICM trend: 10/06/2020.    1 Year ICM trend:       [name], RN 10/20/2020 9:45 AM

## 2020-10-26 ENCOUNTER — Other Ambulatory Visit: Payer: Self-pay

## 2020-10-26 ENCOUNTER — Telehealth: Payer: Self-pay | Admitting: Family Medicine

## 2020-10-26 DIAGNOSIS — H353221 Exudative age-related macular degeneration, left eye, with active choroidal neovascularization: Secondary | ICD-10-CM | POA: Diagnosis not present

## 2020-10-26 NOTE — Telephone Encounter (Signed)
Left message asking patient to call office Please reschedule 10/30/20 appointment shannon will be out of office

## 2020-10-27 ENCOUNTER — Ambulatory Visit
Admission: RE | Admit: 2020-10-27 | Discharge: 2020-10-27 | Disposition: A | Discharge status: Home / Self Care | Payer: Medicare Other | Source: Ambulatory Visit | Attending: Orthopedic Surgery | Admitting: Orthopedic Surgery

## 2020-10-27 ENCOUNTER — Ambulatory Visit: Payer: Self-pay

## 2020-10-27 ENCOUNTER — Ambulatory Visit: Payer: Medicare Other | Admitting: Orthopedic Surgery

## 2020-10-27 ENCOUNTER — Ambulatory Visit (INDEPENDENT_AMBULATORY_CARE_PROVIDER_SITE_OTHER): Payer: Medicare Other | Admitting: Family Medicine

## 2020-10-27 ENCOUNTER — Encounter: Payer: Self-pay | Admitting: Family Medicine

## 2020-10-27 VITALS — BP 98/58 | HR 56 | Temp 97.6°F | Wt 268.0 lb

## 2020-10-27 DIAGNOSIS — S01111A Laceration without foreign body of right eyelid and periocular area, initial encounter: Secondary | ICD-10-CM

## 2020-10-27 DIAGNOSIS — R0781 Pleurodynia: Secondary | ICD-10-CM | POA: Diagnosis not present

## 2020-10-27 DIAGNOSIS — I672 Cerebral atherosclerosis: Secondary | ICD-10-CM | POA: Diagnosis not present

## 2020-10-27 DIAGNOSIS — E039 Hypothyroidism, unspecified: Secondary | ICD-10-CM

## 2020-10-27 DIAGNOSIS — Z23 Encounter for immunization: Secondary | ICD-10-CM | POA: Diagnosis not present

## 2020-10-27 DIAGNOSIS — K219 Gastro-esophageal reflux disease without esophagitis: Secondary | ICD-10-CM

## 2020-10-27 DIAGNOSIS — R220 Localized swelling, mass and lump, head: Secondary | ICD-10-CM | POA: Diagnosis not present

## 2020-10-27 DIAGNOSIS — I6782 Cerebral ischemia: Secondary | ICD-10-CM | POA: Diagnosis not present

## 2020-10-27 MED ORDER — LEVOTHYROXINE SODIUM 100 MCG PO TABS: 100.0000 ug | ORAL_TABLET | Freq: Every day | ORAL | 3 refills | Status: DC

## 2020-10-27 MED ORDER — FAMOTIDINE 20 MG PO TABS: 20.0000 mg | ORAL_TABLET | Freq: Every day | ORAL | 3 refills | Status: DC

## 2020-10-27 NOTE — Progress Notes (Signed)
   Subjective:    Patient ID: Justin [name], male    DOB: 06-24-1934, 87 y.o.   MRN: [id number]  HPI Here to discuss some morning spells of nausea and dry heaving he has been having for several months. No heartburn or trouble swallowing. He had been on Omeprazole BID and Pepcid at bedtime, but he decided to cut the Omeprazole back to one dose in the mornings and just Rolaids at bedtime.    Review of Systems  Constitutional: Negative.   Respiratory: Negative.   Cardiovascular: Negative.   Gastrointestinal: Positive for nausea. Negative for abdominal distention, abdominal pain, anal bleeding, blood in stool, constipation, diarrhea, rectal pain and vomiting.       Objective:   Physical Exam Constitutional:      Appearance: Normal appearance.  Cardiovascular:     Rate and Rhythm: Normal rate and regular rhythm.     Pulses: Normal pulses.     Heart sounds: Normal heart sounds.  Pulmonary:     Effort: Pulmonary effort is normal.     Breath sounds: Normal breath sounds.  Abdominal:     General: Abdomen is flat. Bowel sounds are normal. There is no distension.     Palpations: Abdomen is soft. There is no mass.     Tenderness: There is no abdominal tenderness. There is no guarding or rebound.     Hernia: No hernia is present.  Neurological:     Mental Status: He is alert.           Assessment & Plan:  Morning nausea, likely the result of GERD. He will get back on Omeprazole before breakfast and before supper and then Pepcid at bedtime. Recheck as needed.  [name], MD

## 2020-10-27 NOTE — Progress Notes (Signed)
I lm for tam

## 2020-10-27 NOTE — Addendum Note (Signed)
Addended by: Wyvonne Lenz on: 10/27/2020 09:30 AM   Modules accepted: Orders

## 2020-10-28 ENCOUNTER — Other Ambulatory Visit: Payer: Self-pay | Admitting: Family Medicine

## 2020-10-29 ENCOUNTER — Encounter: Payer: Self-pay | Admitting: Orthopedic Surgery

## 2020-10-29 NOTE — Progress Notes (Signed)
Office Visit Note   Patient: Justin [name]           Date of Birth: Dec 29, 1933           MRN: [id number] Visit Date: 10/27/2020 Requested by: Laurey Morale, MD [place],  [place] [address] PCP: Laurey Morale, MD  Subjective: Chief Complaint  Patient presents with  . Other     Rib pain s/p fall 10/27/20    HPI: Justin [name] is an 87 year old patient who had a fall today.  He fell and hit his head.  No loss of consciousness.  He reports rib pain.  Had a laceration over his right eyelid which was Steri-Stripped.  Also broke some teeth in the fall.              ROS: All systems reviewed are negative as they relate to the chief complaint within the history of present illness.  Patient denies  fevers or chills.   Assessment & Plan: Visit Diagnoses:  1. Rib pain on right side   2. Laceration of eyelid of right eye without foreign body, initial encounter     Plan: Impression is no definite rib fracture.  The laceration over the right eyebrow is not grossly contaminated and Dermabond and Steri-Strips applied today.  We will look at that again in a week.  CT scan at the time of this dictation of the head was negative for acute intracranial bleed.  He has an appointment already next week for other orthopedic issues and we will follow up with him at that time.  Follow-Up Instructions: No follow-ups on file.   Orders:  Orders Placed This Encounter  Procedures  . XR Ribs Unilateral Right  . CT HEAD WO CONTRAST   No orders of the defined types were placed in this encounter.     Procedures: No procedures performed   Clinical Data: No additional findings.  Objective: Vital Signs: There were no vitals taken for this visit.  Physical Exam:   Constitutional: Patient appears well-developed HEENT:  Head: Normocephalic Eyes:EOM are normal Neck: Normal range of motion Cardiovascular: Normal rate Pulmonary/chest: Effort normal Neurologic: Patient is  alert Skin: Skin is warm Psychiatric: Patient has normal mood and affect    Ortho Exam: Ortho exam demonstrates no shortness of breath at rest.  Does have a 2 cm laceration over his eyebrow.  Patient is on blood thinners.  Has no focal neurologic defects.  Answers questions appropriately.  Does have tenderness to palpation around the right ribs.  Dermabond applied to that laceration over the eyebrow along with Steri-Strips.  Follow-up next week.  Specialty Comments:  No specialty comments available.  Imaging: No results found.   PMFS History: Patient Active Problem List   Diagnosis Date Noted  . Raynaud's phenomenon without gangrene 07/11/2020  . Vertigo 07/11/2020  . Loosening of knee joint prosthesis ([place]) 05/18/2019  . Cough 04/30/2019  . Gastroesophageal reflux disease 04/30/2019  . S/P ICD (internal cardiac defibrillator) procedure 05/03/2017  . NICM (nonischemic cardiomyopathy) ([place]) 05/02/2017  . 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 Severe aortic valve stenosis 03/04/2017  . Hypokalemia due to loss of potassium with diuresis 01/10/2017  . Aortic stenosis 01/10/2017  . Systolic heart failure ([place]) 01/10/2017  . Acute on chronic diastolic CHF (congestive heart failure) ([place]) 01/07/2017  . Hypothyroidism 12/23/2016  . Prostate cancer ([place])   . 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 Edema of both legs 09/01/2014  . Asthma,  intermittent 09/01/2014  . Aortic valve disorders 01/01/2011  . Coronary artery disease involving native coronary artery of native heart without angina pectoris   . GOUT 09/12/2009  . CAD, AUTOLOGOUS BYPASS GRAFT 05/10/2009  . MURMUR 05/08/2009  . BRUIT 05/08/2009  . MUSCLE CRAMPS 12/12/2008  . KNEE SPRAIN 09/21/2008  . Osteoarthritis 07/07/2008  . CONTUSION, HIP 05/13/2008  . BPH with urinary obstruction 02/01/2008  . LOW BACK PAIN SYNDROME 02/01/2008  . TESTOSTERONE DEFICIENCY 07/03/2007  . Hyperlipidemia 07/03/2007   . Essential hypertension 07/03/2007  . MYOCARDIAL INFARCTION, HX OF 07/03/2007  . Allergic rhinitis 07/03/2007  . COLONIC POLYPS, HX OF 07/03/2007  . 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   Past Medical History:  Diagnosis Date  . Age-related macular degeneration, dry, both eyes   . Allergic    "24/7; 365 days/year; I'm allergic to pollens, dust, all southern grasses/trees, mold, mildue, cats, dogs" (01/07/2017)  . Anal fissure   . Asthma    sees [name]   . Benign prostatic hypertrophy    (sees Dr. Denman George  . CAD (coronary artery disease)    a. s/p CABG 2007. b. Cath 08/2016 - 4/4 patent grafts.  . Carotid bruit    carotid u/s 10/10: 0.39% bilaterally  . Chronic combined systolic and diastolic CHF (congestive heart failure) ([place])   . Complication of anesthesia 1980s   "w/anal cyst OR, he gave me a saddle block then put a narcotic in spinal cord; had a severe reaction to that" (01/07/2017)  . Congestive heart failure (CHF) ([place])   . ED (erectile dysfunction)   . Family history of adverse reaction to anesthesia    "daughter wakes up during OR" (01/07/2017)  . GERD (gastroesophageal reflux disease)   . Gout   . HTN (hypertension)   . Hx of colonic polyps    (sees Dr. Henrene Pastor)  . Hyperlipidemia   . Hypothyroidism   . Moderate to severe aortic stenosis    a. s/p TAVR 02/2017.  [name] Myocardial infarction ([place]) ~ 2000  . Obesity   . Osteoarthritis    "was in my knees, hands" (01/07/2017 )  . PAF (paroxysmal atrial fibrillation) ([place])    a. documented post TAVR.  [name] Precancerous skin lesion    (sees Dr. Allyson Sabal)  . Prostate cancer ([place]) dx'd ~ 2014  . 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 Heart attack Father 79  . Allergic rhinitis Father   . Asthma Father   . Heart failure Mother 53  .  Uterine cancer Mother   . Breast cancer Mother   . Colon cancer Neg Hx   . Esophageal cancer Neg Hx     Past Surgical History:  Procedure Laterality Date  . ANUS SURGERY     "opened it back up cause it wouldn't heal; wound up w/a fissure" (01/07/2017)  . BIV ICD INSERTION CRT-D N/A 05/02/2017   Procedure: BIV ICD INSERTION CRT-D;  Surgeon: Deboraha Sprang, MD;  Location: [facility];  Service: Cardiovascular;  Laterality: N/A;  . CARDIAC CATHETERIZATION  10/29/2005  . CARDIAC CATHETERIZATION N/A 08/27/2016   Procedure: Right/Left Heart Cath and Coronary/Graft Angiography;  Surgeon: Burnell Blanks, MD;  Location: [facility];  Service: Cardiovascular;  Laterality: N/A;  . CATARACT EXTRACTION W/ INTRAOCULAR LENS  IMPLANT, BILATERAL Bilateral   . CATARACT EXTRACTION, BILATERAL  2012  . COLONOSCOPY  06/30/2008   no repeats needed   . COLONOSCOPY     had 3 or 4 in the past   . CORONARY ARTERY BYPASS GRAFT  2007   "CABG X4"  . CYST EXCISION PERINEAL  1980s  . HAMMER TOE SURGERY Bilateral   . JOINT REPLACEMENT    . KNEE ARTHROPLASTY  07/30/2011   Procedure: COMPUTER ASSISTED TOTAL KNEE ARTHROPLASTY;  Surgeon: Meredith Pel;  Location: [facility];  Service: Orthopedics;  Laterality: Left;  left total knee arthroplasty  . MASTECTOMY SUBCUTANEOUS Bilateral   . MULTIPLE TOOTH EXTRACTIONS    . ORIF FINGER / THUMB FRACTURE Right ~ 1980   "repair of thumb injury"  . PROSTATE BIOPSY    . REPLACEMENT TOTAL KNEE BILATERAL Bilateral 2012  . TEE WITHOUT CARDIOVERSION N/A 03/04/2017   Procedure: TRANSESOPHAGEAL ECHOCARDIOGRAM (TEE);  Surgeon: Burnell Blanks, MD;  Location: [facility];  Service: Open Heart Surgery;  Laterality: N/A;  . TOTAL KNEE REVISION Right 05/18/2019   Procedure: RIGHT PATELLA REVISION/REMOVAL;  Surgeon: Meredith Pel, MD;  Location: [facility];  Service: Orthopedics;  Laterality: Right;  . TRANSCATHETER AORTIC VALVE REPLACEMENT, TRANSFEMORAL N/A 03/04/2017    Procedure: TRANSCATHETER AORTIC VALVE REPLACEMENT, TRANSFEMORAL;  Surgeon: Burnell Blanks, MD;  Location: [facility];  Service: Open Heart Surgery;  Laterality: N/A;   Social History   Occupational History  . Occupation: Clinical biochemist    Comment: builds malls  Tobacco Use  . Smoking status: Former Smoker    Packs/day: 3.50    Years: 13.00    Pack years: 45.50    Types: Cigarettes    Quit date: 1963    Years since quitting: 59.2  . Smokeless tobacco: Never Used  Vaping Use  . Vaping Use: Never used  Substance and Sexual Activity  . Alcohol use: Yes    Alcohol/week: 0.0 standard drinks    Comment: 01/07/2017 "might have 2 beers 2-3 times/month"  . Drug use: No  . Sexual activity: Never

## 2020-10-30 ENCOUNTER — Ambulatory Visit: Payer: Medicare Other

## 2020-10-31 DIAGNOSIS — D229 Melanocytic nevi, unspecified: Secondary | ICD-10-CM | POA: Diagnosis not present

## 2020-10-31 DIAGNOSIS — L905 Scar conditions and fibrosis of skin: Secondary | ICD-10-CM | POA: Diagnosis not present

## 2020-10-31 DIAGNOSIS — L570 Actinic keratosis: Secondary | ICD-10-CM | POA: Diagnosis not present

## 2020-10-31 DIAGNOSIS — L819 Disorder of pigmentation, unspecified: Secondary | ICD-10-CM | POA: Diagnosis not present

## 2020-10-31 DIAGNOSIS — C44329 Squamous cell carcinoma of skin of other parts of face: Secondary | ICD-10-CM | POA: Diagnosis not present

## 2020-10-31 DIAGNOSIS — L821 Other seborrheic keratosis: Secondary | ICD-10-CM | POA: Diagnosis not present

## 2020-10-31 DIAGNOSIS — D485 Neoplasm of uncertain behavior of skin: Secondary | ICD-10-CM | POA: Diagnosis not present

## 2020-10-31 DIAGNOSIS — Z85828 Personal history of other malignant neoplasm of skin: Secondary | ICD-10-CM | POA: Diagnosis not present

## 2020-10-31 DIAGNOSIS — L814 Other melanin hyperpigmentation: Secondary | ICD-10-CM | POA: Diagnosis not present

## 2020-11-01 DIAGNOSIS — R6889 Other general symptoms and signs: Secondary | ICD-10-CM | POA: Diagnosis not present

## 2020-11-03 ENCOUNTER — Ambulatory Visit (INDEPENDENT_AMBULATORY_CARE_PROVIDER_SITE_OTHER): Payer: Medicare Other | Admitting: Orthopedic Surgery

## 2020-11-03 ENCOUNTER — Telehealth: Payer: Self-pay

## 2020-11-03 DIAGNOSIS — R0781 Pleurodynia: Secondary | ICD-10-CM | POA: Diagnosis not present

## 2020-11-03 MED ORDER — OXYCODONE HCL 5 MG PO TABS: 5.0000 mg | ORAL_TABLET | Freq: Two times a day (BID) | ORAL | 0 refills | Status: DC | PRN

## 2020-11-03 MED ORDER — METHOCARBAMOL 500 MG PO TABS: 500.0000 mg | ORAL_TABLET | Freq: Three times a day (TID) | ORAL | 0 refills | Status: DC | PRN

## 2020-11-03 NOTE — Telephone Encounter (Signed)
error 

## 2020-11-04 ENCOUNTER — Encounter: Payer: Self-pay | Admitting: Orthopedic Surgery

## 2020-11-04 NOTE — Progress Notes (Signed)
Office Visit Note   Patient: Justin [name]           Date of Birth: Mar 17, 1934           MRN: [id number] Visit Date: 11/03/2020 Requested by: Laurey Morale, MD [place],  [place] [address] PCP: Laurey Morale, MD  Subjective: Chief Complaint  Patient presents with  . Other    F/u right rib injury    HPI: Justin [name] is an 87 year old patient who fell last week and injured his right rib.  He also injured and sustained a laceration above his right eye.  Having some occasional right knee pinching.  Had patellar revision on that side.  He is looking to get back to doing some exercising.  It is hard for him to cough.  Developed bruising on the right side shortly after injury.  Hurts him some to get up and down.  Overall he is doing better.  Has a little pinching in the right lateral knee but no pain.  He does want to walk on the treadmill.              ROS: All systems reviewed are negative as they relate to the chief complaint within the history of present illness.  Patient denies  fevers or chills.   Assessment & Plan: Visit Diagnoses:  1. Rib pain on right side     Plan: Impression is well-healed right eyebrow incision.  Bruising in the right chest wall area consistent with rib fracture.  No shortness of breath today.  That would likely take a little while to heal.  I will refill oxycodone x1 and muscle relaxer x1 in case this rib pain becomes more problematic.  He does not take it on a regular basis.  Regarding his right knee based on exam today I see no reason why he should not be able to walk on the treadmill.  Follow-up as needed  Follow-Up Instructions: Return if symptoms worsen or fail to improve.   Orders:  No orders of the defined types were placed in this encounter.  Meds ordered this encounter  Medications  . methocarbamol (ROBAXIN) 500 MG tablet    Sig: Take 1 tablet (500 mg total) by mouth every 8 (eight) hours as needed for muscle spasms.     Dispense:  30 tablet    Refill:  0  . oxyCODONE (OXY IR/ROXICODONE) 5 MG immediate release tablet    Sig: Take 1 tablet (5 mg total) by mouth every 12 (twelve) hours as needed for severe pain.    Dispense:  30 tablet    Refill:  0      Procedures: No procedures performed   Clinical Data: No additional findings.  Objective: Vital Signs: There were no vitals taken for this visit.  Physical Exam:   Constitutional: Patient appears well-developed HEENT:  Head: Normocephalic Eyes:EOM are normal Neck: Normal range of motion Cardiovascular: Normal rate Pulmonary/chest: Effort normal Neurologic: Patient is alert Skin: Skin is warm Psychiatric: Patient has normal mood and affect    Ortho Exam: Examination of bilateral knees demonstrates excellent range of motion no warmth no effusion.  No real significant crepitus in either knee bilaterally asymmetrically.  Collaterals are stable.  Negative patellar apprehension.  No groin pain with internal or external rotation of either leg.  Eyebrow has healed nicely.  Does have some ecchymosis in the lower right chest wall but can inspire fully.  Specialty Comments:  No specialty comments available.  Imaging: No results found.   PMFS History: Patient Active Problem List   Diagnosis Date Noted  . Raynaud's phenomenon without gangrene 07/11/2020  . Vertigo 07/11/2020  . Loosening of knee joint prosthesis ([place]) 05/18/2019  . Cough 04/30/2019  . Gastroesophageal reflux disease 04/30/2019  . S/P ICD (internal cardiac defibrillator) procedure 05/03/2017  . NICM (nonischemic cardiomyopathy) ([place]) 05/02/2017  . 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 Severe aortic valve stenosis 03/04/2017  . Hypokalemia due to loss of potassium with diuresis 01/10/2017  . Aortic stenosis 01/10/2017  . Systolic heart failure ([place]) 01/10/2017  . Acute on chronic diastolic CHF (congestive heart failure) ([place]) 01/07/2017  .  Hypothyroidism 12/23/2016  . Prostate cancer ([place])   . 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 Edema of both legs 09/01/2014  . Asthma, intermittent 09/01/2014  . Aortic valve disorders 01/01/2011  . Coronary artery disease involving native coronary artery of native heart without angina pectoris   . GOUT 09/12/2009  . CAD, AUTOLOGOUS BYPASS GRAFT 05/10/2009  . MURMUR 05/08/2009  . BRUIT 05/08/2009  . MUSCLE CRAMPS 12/12/2008  . KNEE SPRAIN 09/21/2008  . Osteoarthritis 07/07/2008  . CONTUSION, HIP 05/13/2008  . BPH with urinary obstruction 02/01/2008  . LOW BACK PAIN SYNDROME 02/01/2008  . TESTOSTERONE DEFICIENCY 07/03/2007  . Hyperlipidemia 07/03/2007  . Essential hypertension 07/03/2007  . MYOCARDIAL INFARCTION, HX OF 07/03/2007  . Allergic rhinitis 07/03/2007  . COLONIC POLYPS, HX OF 07/03/2007  . 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   Past Medical History:  Diagnosis Date  . Age-related macular degeneration, dry, both eyes   . Allergic    "24/7; 365 days/year; I'm allergic to pollens, dust, all southern grasses/trees, mold, mildue, cats, dogs" (01/07/2017)  . Anal fissure   . Asthma    sees [name]   . Benign prostatic hypertrophy    (sees Dr. Denman George  . CAD (coronary artery disease)    a. s/p CABG 2007. b. Cath 08/2016 - 4/4 patent grafts.  . Carotid bruit    carotid u/s 10/10: 0.39% bilaterally  . Chronic combined systolic and diastolic CHF (congestive heart failure) ([place])   . Complication of anesthesia 1980s   "w/anal cyst OR, he gave me a saddle block then put a narcotic in spinal cord; had a severe reaction to that" (01/07/2017)  . Congestive heart failure (CHF) ([place])   . ED (erectile dysfunction)   . Family history of adverse reaction to anesthesia    "daughter wakes up during OR" (01/07/2017)  . GERD (gastroesophageal reflux disease)   . Gout   . HTN (hypertension)   . Hx of colonic polyps    (sees Dr. Henrene Pastor)  . Hyperlipidemia   . Hypothyroidism   . Moderate to  severe aortic stenosis    a. s/p TAVR 02/2017.  [name] Myocardial infarction ([place]) ~ 2000  . Obesity   . Osteoarthritis    "was in my knees, hands" (01/07/2017 )  . PAF (paroxysmal atrial fibrillation) ([place])    a. documented post TAVR.  [name] Precancerous skin lesion    (sees Dr. Allyson Sabal)  . Prostate cancer ([place]) dx'd ~ 2014  . 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 Heart attack Father 44  . Allergic rhinitis Father   . Asthma Father   .  Heart failure Mother 8  . Uterine cancer Mother   . Breast cancer Mother   . Colon cancer Neg Hx   . Esophageal cancer Neg Hx     Past Surgical History:  Procedure Laterality Date  . ANUS SURGERY     "opened it back up cause it wouldn't heal; wound up w/a fissure" (01/07/2017)  . BIV ICD INSERTION CRT-D N/A 05/02/2017   Procedure: BIV ICD INSERTION CRT-D;  Surgeon: Deboraha Sprang, MD;  Location: [facility];  Service: Cardiovascular;  Laterality: N/A;  . CARDIAC CATHETERIZATION  10/29/2005  . CARDIAC CATHETERIZATION N/A 08/27/2016   Procedure: Right/Left Heart Cath and Coronary/Graft Angiography;  Surgeon: Burnell Blanks, MD;  Location: [facility];  Service: Cardiovascular;  Laterality: N/A;  . CATARACT EXTRACTION W/ INTRAOCULAR LENS  IMPLANT, BILATERAL Bilateral   . CATARACT EXTRACTION, BILATERAL  2012  . COLONOSCOPY  06/30/2008   no repeats needed   . COLONOSCOPY     had 3 or 4 in the past   . CORONARY ARTERY BYPASS GRAFT  2007   "CABG X4"  . CYST EXCISION PERINEAL  1980s  . HAMMER TOE SURGERY Bilateral   . JOINT REPLACEMENT    . KNEE ARTHROPLASTY  07/30/2011   Procedure: COMPUTER ASSISTED TOTAL KNEE ARTHROPLASTY;  Surgeon: Meredith Pel;  Location: [facility];  Service: Orthopedics;  Laterality: Left;  left total knee arthroplasty   . MASTECTOMY SUBCUTANEOUS Bilateral   . MULTIPLE TOOTH EXTRACTIONS    . ORIF FINGER / THUMB FRACTURE Right ~ 1980   "repair of thumb injury"  . PROSTATE BIOPSY    . REPLACEMENT TOTAL KNEE BILATERAL Bilateral 2012  . TEE WITHOUT CARDIOVERSION N/A 03/04/2017   Procedure: TRANSESOPHAGEAL ECHOCARDIOGRAM (TEE);  Surgeon: Burnell Blanks, MD;  Location: [facility];  Service: Open Heart Surgery;  Laterality: N/A;  . TOTAL KNEE REVISION Right 05/18/2019   Procedure: RIGHT PATELLA REVISION/REMOVAL;  Surgeon: Meredith Pel, MD;  Location: [facility];  Service: Orthopedics;  Laterality: Right;  . TRANSCATHETER AORTIC VALVE REPLACEMENT, TRANSFEMORAL N/A 03/04/2017   Procedure: TRANSCATHETER AORTIC VALVE REPLACEMENT, TRANSFEMORAL;  Surgeon: Burnell Blanks, MD;  Location: [facility];  Service: Open Heart Surgery;  Laterality: N/A;   Social History   Occupational History  . Occupation: Clinical biochemist    Comment: builds malls  Tobacco Use  . Smoking status: Former Smoker    Packs/day: 3.50    Years: 13.00    Pack years: 45.50    Types: Cigarettes    Quit date: 1963    Years since quitting: 59.2  . Smokeless tobacco: Never Used  Vaping Use  . Vaping Use: Never used  Substance and Sexual Activity  . Alcohol use: Yes    Alcohol/week: 0.0 standard drinks    Comment: 01/07/2017 "might have 2 beers 2-3 times/month"  . Drug use: No  . Sexual activity: Never

## 2020-11-05 ENCOUNTER — Other Ambulatory Visit: Payer: Self-pay | Admitting: Orthopedic Surgery

## 2020-11-06 DIAGNOSIS — D485 Neoplasm of uncertain behavior of skin: Secondary | ICD-10-CM | POA: Diagnosis not present

## 2020-11-06 DIAGNOSIS — C44329 Squamous cell carcinoma of skin of other parts of face: Secondary | ICD-10-CM | POA: Diagnosis not present

## 2020-11-06 DIAGNOSIS — D0439 Carcinoma in situ of skin of other parts of face: Secondary | ICD-10-CM | POA: Diagnosis not present

## 2020-11-06 NOTE — Telephone Encounter (Signed)
Please advise 

## 2020-11-06 NOTE — Telephone Encounter (Signed)
Recommend he reaach out to PCP regarding this given his history and no recent gout flares

## 2020-11-08 ENCOUNTER — Other Ambulatory Visit: Payer: Self-pay | Admitting: Surgical

## 2020-11-08 MED ORDER — COLCHICINE 0.6 MG PO TABS: 0.6000 mg | ORAL_TABLET | Freq: Every day | ORAL | 0 refills | Status: DC

## 2020-11-09 DIAGNOSIS — R6889 Other general symptoms and signs: Secondary | ICD-10-CM | POA: Diagnosis not present

## 2020-11-13 ENCOUNTER — Ambulatory Visit (INDEPENDENT_AMBULATORY_CARE_PROVIDER_SITE_OTHER): Payer: Medicare Other

## 2020-11-13 ENCOUNTER — Telehealth: Payer: Self-pay | Admitting: Family Medicine

## 2020-11-13 DIAGNOSIS — R110 Nausea: Secondary | ICD-10-CM

## 2020-11-13 DIAGNOSIS — I5042 Chronic combined systolic (congestive) and diastolic (congestive) heart failure: Secondary | ICD-10-CM | POA: Diagnosis not present

## 2020-11-13 DIAGNOSIS — Z95810 Presence of automatic (implantable) cardiac defibrillator: Secondary | ICD-10-CM | POA: Diagnosis not present

## 2020-11-13 NOTE — Telephone Encounter (Signed)
Pt was last seen at the office on 10/27/2020, state that he is still having the nausea/vomiting feeling. Please advise

## 2020-11-13 NOTE — Telephone Encounter (Signed)
Patient is calling and stated that he stills feeling like he is about to vomit and wanted to know what else he could do about it, please advice. CB is [phone]

## 2020-11-14 ENCOUNTER — Other Ambulatory Visit (INDEPENDENT_AMBULATORY_CARE_PROVIDER_SITE_OTHER): Payer: Medicare Other

## 2020-11-14 ENCOUNTER — Other Ambulatory Visit: Payer: Self-pay

## 2020-11-14 DIAGNOSIS — R110 Nausea: Secondary | ICD-10-CM | POA: Diagnosis not present

## 2020-11-14 LAB — CBC WITH DIFFERENTIAL/PLATELET
Basophils Absolute: 0.2 10*3/uL — ABNORMAL HIGH (ref 0.0–0.1)
Basophils Relative: 1.9 % (ref 0.0–3.0)
Eosinophils Absolute: 0.4 10*3/uL (ref 0.0–0.7)
Eosinophils Relative: 3.7 % (ref 0.0–5.0)
HCT: 38.7 % — ABNORMAL LOW (ref 39.0–52.0)
Hemoglobin: 12.8 g/dL — ABNORMAL LOW (ref 13.0–17.0)
Lymphocytes Relative: 23.3 % (ref 12.0–46.0)
Lymphs Abs: 2.4 10*3/uL (ref 0.7–4.0)
MCHC: 33.1 g/dL (ref 30.0–36.0)
MCV: 85.9 fl (ref 78.0–100.0)
Monocytes Absolute: 1 10*3/uL (ref 0.1–1.0)
Monocytes Relative: 9.4 % (ref 3.0–12.0)
Neutro Abs: 6.4 10*3/uL (ref 1.4–7.7)
Neutrophils Relative %: 61.7 % (ref 43.0–77.0)
Platelets: 223 10*3/uL (ref 150.0–400.0)
RBC: 4.51 Mil/uL (ref 4.22–5.81)
RDW: 16.4 % — ABNORMAL HIGH (ref 11.5–15.5)
WBC: 10.4 10*3/uL (ref 4.0–10.5)

## 2020-11-14 LAB — BASIC METABOLIC PANEL
BUN: 25 mg/dL — ABNORMAL HIGH (ref 6–23)
CO2: 24 mEq/L (ref 19–32)
Calcium: 8.4 mg/dL (ref 8.4–10.5)
Chloride: 105 mEq/L (ref 96–112)
Creatinine, Ser: 1.05 mg/dL (ref 0.40–1.50)
GFR: 65.23 mL/min (ref >60.00)
Glucose, Bld: 120 mg/dL — ABNORMAL HIGH (ref 70–99)
Potassium: 4.6 mEq/L (ref 3.5–5.1)
Sodium: 137 mEq/L (ref 135–145)

## 2020-11-14 LAB — T4, FREE: Free T4: 0.99 ng/dL (ref 0.60–1.60)

## 2020-11-14 LAB — T3, FREE: T3, Free: 2.4 pg/mL (ref 2.3–4.2)

## 2020-11-14 LAB — HEPATIC FUNCTION PANEL
ALT: 12 U/L (ref 0–53)
AST: 16 U/L (ref 0–37)
Albumin: 3.5 g/dL (ref 3.5–5.2)
Alkaline Phosphatase: 115 U/L (ref 39–117)
Bilirubin, Direct: 0.2 mg/dL (ref 0.0–0.3)
Total Bilirubin: 0.8 mg/dL (ref 0.2–1.2)
Total Protein: 6.2 g/dL (ref 6.0–8.3)

## 2020-11-14 LAB — AMYLASE: Amylase: 22 U/L — ABNORMAL LOW (ref 27–131)

## 2020-11-14 LAB — TSH: TSH: 2.8 u[IU]/mL (ref 0.35–4.50)

## 2020-11-14 LAB — AMMONIA: Ammonia: 32 umol/L (ref 11–35)

## 2020-11-14 LAB — LIPASE: Lipase: 22 U/L (ref 11.0–59.0)

## 2020-11-14 NOTE — Telephone Encounter (Signed)
LVM for patient to call schedule lab appointment

## 2020-11-14 NOTE — Progress Notes (Signed)
EPIC Encounter for ICM Monitoring  Patient Name: Justin [name] is a 87 y.o. male Date: 11/14/2020 Primary Care Physican: Laurey Morale, MD Primary Cardiologist:McAlhany Electrophysiologist:Klein Bi-V Pacing:85.1 %  3/04/2022OfficeWeight: 268lbs     Spoke with patient and reports feeling well at this time.  Denies fluid symptoms.  Pt had a fall that resulted in broken rib, chipped teeth.   Optivol thoracic impedancesuggestingnormal fluid levels.   Prescribed:   Furosemide80 mgtaketake0.5tablet (40 mg total) by mouth daily.Per [name] pharmacist, he may take extra 20-40 mg as needed for weight gain/swelling.  KLC 20 mEq take 1 tablet daily  Spironolactone 25 mg take 1 tablet daily  Recommendations:  No changes and encouraged to call if experiencing any fluid symptoms.  Follow-up plan: ICM clinic phone appointment on4/25/2022. 91 day device clinic remote transmission5/16/2022.  EP/Cardiology Office Visits:Recall 01/08/2021 with Dr Angelena Form. Recall 07/12/2021 with Dr [name]  Copy of ICM check sent to [facility].   3 month ICM trend: 11/14/2020.    1 Year ICM trend:       [name], RN 11/14/2020 3:20 PM

## 2020-11-14 NOTE — Telephone Encounter (Signed)
Spoke with patient,lab appointment is scheduled for 11/14/20

## 2020-11-14 NOTE — Telephone Encounter (Signed)
I am not sure what is causing the nausea but I have put in orders for some lab work. Have him make a lab appt to have these drawn

## 2020-11-15 DIAGNOSIS — R6889 Other general symptoms and signs: Secondary | ICD-10-CM | POA: Diagnosis not present

## 2020-11-28 ENCOUNTER — Telehealth: Payer: Self-pay | Admitting: Physician Assistant

## 2020-11-28 MED ORDER — FUROSEMIDE 80 MG PO TABS: ORAL_TABLET | ORAL | 3 refills | Status: DC

## 2020-11-28 MED ORDER — POTASSIUM CHLORIDE CRYS ER 20 MEQ PO TBCR: EXTENDED_RELEASE_TABLET | ORAL | 3 refills | Status: DC

## 2020-11-28 NOTE — Telephone Encounter (Signed)
Reviewed last ov note w Dr. Angelena Form.  No changes in meds.  Recent labs with PCP.  Potassium and creatinine WNL.  Medications refilled as requested.

## 2020-11-28 NOTE — Telephone Encounter (Signed)
Pt c/o medication issue:  1. Name of Medication: furosemide (LASIX) 80 MG tablet potassium chloride SA (KLOR-CON) 20 MEQ tablet  2. How are you currently taking this medication (dosage and times per day)? 1 full tablet of Lasix 2 tablets of potassium. Takes 1/2 tablet of Lasix the following day and 1 tablet of potassium. Alternates between the everyday.    3. Are you having a reaction (difficulty breathing--STAT)? No   4. What is your medication issue? Patient wants prescriptions updated with how he currently takes them so he does not have trouble getting prescriptions when need be. He then wants the new prescriptions sent to the pharmacy with the information listed below.    *STAT* If patient is at the pharmacy, call can be transferred to refill team.   1. Which medications need to be refilled? (please list name of each medication and dose if known) furosemide (LASIX) 80 MG tablet potassium chloride SA (KLOR-CON) 20 MEQ tablet  2. Which pharmacy/location (including street and city if local pharmacy) is medication to be sent to? [facility], [address]  3. Do they need a 30 day or 90 day supply? 90 day supply

## 2020-12-05 DIAGNOSIS — D485 Neoplasm of uncertain behavior of skin: Secondary | ICD-10-CM | POA: Diagnosis not present

## 2020-12-05 DIAGNOSIS — D0439 Carcinoma in situ of skin of other parts of face: Secondary | ICD-10-CM | POA: Diagnosis not present

## 2020-12-05 DIAGNOSIS — C44722 Squamous cell carcinoma of skin of right lower limb, including hip: Secondary | ICD-10-CM | POA: Diagnosis not present

## 2020-12-11 DIAGNOSIS — H0100A Unspecified blepharitis right eye, upper and lower eyelids: Secondary | ICD-10-CM | POA: Diagnosis not present

## 2020-12-11 DIAGNOSIS — H0100B Unspecified blepharitis left eye, upper and lower eyelids: Secondary | ICD-10-CM | POA: Diagnosis not present

## 2020-12-12 ENCOUNTER — Telehealth: Payer: Self-pay | Admitting: Family Medicine

## 2020-12-12 NOTE — Telephone Encounter (Signed)
Pt would like a call from the nurse or doctor about what may be the cause of his dry heaves. He had it this morning. He has no vomiting, he states he has spoken to the Dr about this ongoing problem   [phone]

## 2020-12-13 ENCOUNTER — Other Ambulatory Visit: Payer: Self-pay

## 2020-12-13 MED ORDER — METOCLOPRAMIDE HCL 10 MG PO TABS: ORAL_TABLET | ORAL | 2 refills | Status: DC

## 2020-12-13 NOTE — Telephone Encounter (Signed)
I really do not know why he has this nausea but we can control it with medication, if he wants to try something. Call in Reglan 10 mg to take one every morning with breakfast, #30 with 2 rf

## 2020-12-13 NOTE — Telephone Encounter (Signed)
Spoke with pt state that he is still experiencing Dry Heaves in the mornings after breakfast, pt states that he takes his levothyroxine 30 minutes before breakfast and after breakfast he takes his AM med's, pt state that nothing has changed with his diet or medication. Request for advise on what he needs to do next.

## 2020-12-13 NOTE — Telephone Encounter (Signed)
Spoke with pt advised that Dr Sarajane Jews sent Rx for Reglan 10 mg to take daily with breakfast

## 2020-12-18 ENCOUNTER — Ambulatory Visit (INDEPENDENT_AMBULATORY_CARE_PROVIDER_SITE_OTHER): Payer: Medicare Other

## 2020-12-18 DIAGNOSIS — H0012 Chalazion right lower eyelid: Secondary | ICD-10-CM | POA: Diagnosis not present

## 2020-12-18 DIAGNOSIS — Z95810 Presence of automatic (implantable) cardiac defibrillator: Secondary | ICD-10-CM | POA: Diagnosis not present

## 2020-12-18 DIAGNOSIS — Z961 Presence of intraocular lens: Secondary | ICD-10-CM | POA: Diagnosis not present

## 2020-12-18 DIAGNOSIS — I5042 Chronic combined systolic (congestive) and diastolic (congestive) heart failure: Secondary | ICD-10-CM | POA: Diagnosis not present

## 2020-12-18 NOTE — Progress Notes (Signed)
EPIC Encounter for ICM Monitoring  Patient Name: Justin [name] is a 87 y.o. male Date: 12/18/2020 Primary Care Physican: Laurey Morale, MD Primary Cardiologist:McAlhany Electrophysiologist:Klein Bi-V Pacing:63.1%  (was 85.1% on 11/14/2020 report) 4/25/2022OfficeWeight: 264lbs         Spoke with patient and reports feeling well at this time.  Denies fluid symptoms.  He has been eating restaurant foods which may contribute to decreased impedance.   Optivol thoracic impedancesuggestingpossible fluid accumulation starting 12/14/2020.  Message sent to devic clinic triage 4/25 to review decreased BiV pacing %.  Prescribed:   Furosemide80 mgtake1 tablet every other day alternating with 0.5tablet (40 mg total) every other day.Per [name] pharmacist, he may take extra 20-40 mg as needed for weight gain/swelling.  KLC 20 mEq take 2 tablets every other day alternating with 1 tablet every other day.  Spironolactone 25 mg take 1 tablet daily  Labs: 11/14/2020 Creatinine 1.05, BUN 25, Potassium 4.6, Sodium 137, GFR 65.23 A complete set of results can be found in Results Review.  Recommendations: Patient will take Furosemide 80 mg daily x 3 days and then resume alternating dosage.    Follow-up plan: ICM clinic phone appointment on4/29/2022 (manual) to recheck fluid levels. 91 day device clinic remote transmission5/16/2022.  EP/Cardiology Office Visits:Recall 01/08/2021 with Dr Angelena Form. Recall 07/12/2021 with Dr [name]  Copy of ICM check sent to Dr.Klein and Dr Angelena Form as Juluis Rainier.    3 month ICM trend: 12/18/2020.    1 Year ICM trend:       [name], RN 12/18/2020 1:26 PM

## 2020-12-20 DIAGNOSIS — C44722 Squamous cell carcinoma of skin of right lower limb, including hip: Secondary | ICD-10-CM | POA: Diagnosis not present

## 2020-12-22 ENCOUNTER — Ambulatory Visit: Payer: Medicare Other

## 2020-12-22 DIAGNOSIS — I5042 Chronic combined systolic (congestive) and diastolic (congestive) heart failure: Secondary | ICD-10-CM

## 2020-12-22 DIAGNOSIS — Z95810 Presence of automatic (implantable) cardiac defibrillator: Secondary | ICD-10-CM

## 2020-12-22 NOTE — Progress Notes (Signed)
EPIC Encounter for ICM Monitoring  Patient Name: Justin [name] is a 87 y.o. male Date: 12/22/2020 Primary Care Physican: Laurey Morale, MD Primary Cardiologist:[place] Electrophysiologist:Klein Bi-V Pacing:72.7%  (was 85.1%on 11/14/2020 report) 4/25/2022OfficeWeight: 264lbs       Spoke with patient and reports feeling well at this time.  Denies fluid symptoms.   Optivol thoracic impedancesuggestingfluid levels returned normal after taking extra Furosemide.  Prescribed:   Furosemide80 mgtake1 tablet every other day alternating with 0.5tablet (40 mg total) every other day.Per [name] pharmacist, he may take extra 20-40 mg as needed for weight gain/swelling.  KLC 20 mEq take 2 tablets every other day alternating with 1 tablet every other day.  Spironolactone 25 mg take 1 tablet daily  Labs: 11/14/2020 Creatinine 1.05, BUN 25, Potassium 4.6, Sodium 137, GFR 65.23 A complete set of results can be found in Results Review.  Recommendations: No changes and encouraged to call if experiencing any fluid symptoms.  Follow-up plan: ICM clinic phone appointment on6/01/2021. 91 day device clinic remote transmission5/16/2022.  EP/Cardiology Office Visits:Recall 01/08/2021 with Dr Angelena Form. Recall 07/12/2021 with Dr [name]  Copy of ICM check sent to [facility]  3 month ICM trend: 12/22/2020.    1 Year ICM trend:       [name], RN 12/22/2020 2:26 PM

## 2020-12-26 DIAGNOSIS — D485 Neoplasm of uncertain behavior of skin: Secondary | ICD-10-CM | POA: Diagnosis not present

## 2020-12-26 DIAGNOSIS — C44311 Basal cell carcinoma of skin of nose: Secondary | ICD-10-CM | POA: Diagnosis not present

## 2020-12-26 DIAGNOSIS — C44319 Basal cell carcinoma of skin of other parts of face: Secondary | ICD-10-CM | POA: Diagnosis not present

## 2020-12-26 DIAGNOSIS — C44219 Basal cell carcinoma of skin of left ear and external auricular canal: Secondary | ICD-10-CM | POA: Diagnosis not present

## 2020-12-26 DIAGNOSIS — L905 Scar conditions and fibrosis of skin: Secondary | ICD-10-CM | POA: Diagnosis not present

## 2020-12-26 DIAGNOSIS — Z85828 Personal history of other malignant neoplasm of skin: Secondary | ICD-10-CM | POA: Diagnosis not present

## 2020-12-26 DIAGNOSIS — L820 Inflamed seborrheic keratosis: Secondary | ICD-10-CM | POA: Diagnosis not present

## 2020-12-26 DIAGNOSIS — L570 Actinic keratosis: Secondary | ICD-10-CM | POA: Diagnosis not present

## 2021-01-04 DIAGNOSIS — H353221 Exudative age-related macular degeneration, left eye, with active choroidal neovascularization: Secondary | ICD-10-CM | POA: Diagnosis not present

## 2021-01-05 DIAGNOSIS — C44212 Basal cell carcinoma of skin of right ear and external auricular canal: Secondary | ICD-10-CM | POA: Diagnosis not present

## 2021-01-05 DIAGNOSIS — C44319 Basal cell carcinoma of skin of other parts of face: Secondary | ICD-10-CM | POA: Diagnosis not present

## 2021-01-08 ENCOUNTER — Ambulatory Visit (INDEPENDENT_AMBULATORY_CARE_PROVIDER_SITE_OTHER): Payer: Medicare Other

## 2021-01-08 DIAGNOSIS — I428 Other cardiomyopathies: Secondary | ICD-10-CM | POA: Diagnosis not present

## 2021-01-09 LAB — CUP PACEART REMOTE DEVICE CHECK
Battery Remaining Longevity: 22 mo
Battery Voltage: 2.93 V
Brady Statistic AP VP Percent: 7.45 %
Brady Statistic AP VS Percent: 1.24 %
Brady Statistic AS VP Percent: 74.07 %
Brady Statistic AS VS Percent: 17.24 %
Brady Statistic RA Percent Paced: 7.18 %
Brady Statistic RV Percent Paced: 52.08 %
Date Time Interrogation Session: 20220516172416
HighPow Impedance: 71 Ohm
Implantable Lead Implant Date: 20180907
Implantable Lead Implant Date: 20180907
Implantable Lead Implant Date: 20180907
Implantable Lead Location: 753858
Implantable Lead Location: 753859
Implantable Lead Location: 753860
Implantable Lead Model: 4396
Implantable Lead Model: 5076
Implantable Pulse Generator Implant Date: 20180907
Lead Channel Impedance Value: 323 Ohm
Lead Channel Impedance Value: 342 Ohm
Lead Channel Impedance Value: 399 Ohm
Lead Channel Impedance Value: 456 Ohm
Lead Channel Impedance Value: 494 Ohm
Lead Channel Impedance Value: 817 Ohm
Lead Channel Pacing Threshold Amplitude: 0.75 V
Lead Channel Pacing Threshold Amplitude: 1 V
Lead Channel Pacing Threshold Amplitude: 1.25 V
Lead Channel Pacing Threshold Pulse Width: 0.4 ms
Lead Channel Pacing Threshold Pulse Width: 0.4 ms
Lead Channel Pacing Threshold Pulse Width: 1 ms
Lead Channel Sensing Intrinsic Amplitude: 1.625 mV
Lead Channel Sensing Intrinsic Amplitude: 1.625 mV
Lead Channel Sensing Intrinsic Amplitude: 8.75 mV
Lead Channel Sensing Intrinsic Amplitude: 8.75 mV
Lead Channel Setting Pacing Amplitude: 1.5 V
Lead Channel Setting Pacing Amplitude: 2 V
Lead Channel Setting Pacing Amplitude: 2.5 V
Lead Channel Setting Pacing Pulse Width: 0.4 ms
Lead Channel Setting Pacing Pulse Width: 1 ms
Lead Channel Setting Sensing Sensitivity: 0.3 mV

## 2021-01-11 ENCOUNTER — Other Ambulatory Visit: Payer: Self-pay

## 2021-01-11 ENCOUNTER — Telehealth: Payer: Self-pay | Admitting: Pharmacist

## 2021-01-11 NOTE — Chronic Care Management (AMB) (Signed)
Chronic Care Management Pharmacy Assistant   Name: Justin [name]  MRN: [id number] DOB: [birth year]  Reason for Encounter: Disease State/ General Assessment Call.   Conditions to be addressed/monitored: CHF, CAD, HTN and HLD   Recent office visits:  10/27/20 [name] MD (PCP) - seen for GERD and other chronic conditions. Changed famotidine to 20mg  daily at bedtime. Go back on omeprazole at breakfast and before supper. Follow up as needed.   Recent consult visits:  11/03/20 Meredith Pel MD (Orthopedic Surgery) - seen for rib pain on right side. Patient started on methocarbamol 500mg  and oxycodone 5mg . Follow up if symptoms worsen or dont improve.   10/27/20 Meredith Pel MD (Orthopedic Surgery) - seen for rib pain on right side and laceration of right eye lid. No medication changes. No follow up noted.   09/01/20 Virl Axe MD (Cardiology) remote defib check.   07/31/20 Virl Axe MD (Cardiology) remote defib check.    Hospital visits:  None in previous 6 months  Medications: Outpatient Encounter Medications as of 01/11/2021  Medication Sig  . aspirin 81 MG tablet Take 1 tablet (81 mg total) by mouth daily.  [name] atorvastatin (LIPITOR) 20 MG tablet TAKE 1 TABLET ONCE DAILY.  [name] azelastine (ASTELIN) 0.1 % nasal spray Place 2 sprays into both nostrils 2 (two) times daily. Use in each nostril as directed  . Azelastine HCl 0.15 % SOLN Place 2 sprays into the nose in the morning and at bedtime.  . benzonatate (TESSALON) 200 MG capsule Take 1 capsule (200 mg total) by mouth 2 (two) times daily as needed for cough.  . cetirizine (ZYRTEC) 10 MG tablet Take 10 mg by mouth daily.  . chlorpheniramine (CHLOR-TRIMETON) 4 MG tablet Take 2 tablets (8 mg total) by mouth at bedtime.  . colchicine 0.6 MG tablet Take 1 tablet (0.6 mg total) by mouth daily.  . dabigatran (PRADAXA) 150 MG CAPS capsule TAKE (1) CAPSULE TWICE DAILY.  . diphenhydrAMINE (BENADRYL) 25 MG tablet Take 25 mg by  mouth at bedtime.   [name] EPINEPHrine 0.3 mg/0.3 mL IJ SOAJ injection Inject 0.3 mLs (0.3 mg total) into the muscle as needed for anaphylaxis.  . famotidine (PEPCID) 20 MG tablet Take 1 tablet (20 mg total) by mouth at bedtime.  . furosemide (LASIX) 80 MG tablet Take one tablet daily alternating with a HALF tablet every other day  . hydrOXYzine (ATARAX/VISTARIL) 10 MG tablet Take 10 mg by mouth at bedtime.   . isosorbide mononitrate (IMDUR) 30 MG 24 hr tablet TAKE 1 TABLET ONCE DAILY.  [name] Ketotifen Fumarate (ZADITOR OP) Apply 1 drop to eye daily as needed (itchy eyes).   [name] levothyroxine (SYNTHROID) 100 MCG tablet Take 1 tablet (100 mcg total) by mouth daily.  . meclizine (ANTIVERT) 25 MG tablet Take 1 tablet (25 mg total) by mouth every 4 (four) hours as needed for dizziness.  . methocarbamol (ROBAXIN) 500 MG tablet Take 1 tablet (500 mg total) by mouth every 8 (eight) hours as needed for muscle spasms.  . metoCLOPramide (REGLAN) 10 MG tablet Take 1 tablet every morning with breakfast  . metoprolol succinate (TOPROL-XL) 50 MG 24 hr tablet Take 1.5 tablets (75 mg total) by mouth daily. Take with or immediately following a meal.  . montelukast (SINGULAIR) 10 MG tablet TAKE 1 TABLET ONCE DAILY IN THE EVENING.  . Multiple Vitamins-Minerals (PRESERVISION AREDS PO) Take 2 tablets by mouth every morning.  [name] omeprazole (PRILOSEC) 20 MG capsule TAKE (1) CAPSULE  TWICE DAILY.  [name] oxyCODONE (OXY IR/ROXICODONE) 5 MG immediate release tablet Take 1 tablet (5 mg total) by mouth every 12 (twelve) hours as needed for severe pain.  . potassium bicarbonate (K-LYTE) 25 MEQ disintegrating tablet Take 25 mEq by mouth 2 (two) times daily.  . potassium chloride SA (KLOR-CON) 20 MEQ tablet Take 2 tablets daily alternating with 1 tablet every other day  . sacubitril-valsartan (ENTRESTO) 97-103 MG Take 1 tablet by mouth 2 (two) times daily.  [name] spironolactone (ALDACTONE) 25 MG tablet Take 1 tablet (25 mg total) by mouth daily.  [name]  triamcinolone cream (KENALOG) 0.1 % Apply 1 application topically daily as needed for dry skin.  . valACYclovir (VALTREX) 500 MG tablet Take 500 mg by mouth 2 (two) times daily as needed (cold sores).   No facility-administered encounter medications on file as of 01/11/2021.    Reviewed chart prior to disease state call. Spoke with patient regarding BP  Recent Office Vitals: BP Readings from Last 3 Encounters:  10/27/20 (!) 98/58  07/12/20 (!) 98/50  07/12/20 (!) 90/50   Pulse Readings from Last 3 Encounters:  10/27/20 (!) 56  07/12/20 62  07/12/20 62    Wt Readings from Last 3 Encounters:  10/27/20 268 lb (121.6 kg)  07/12/20 271 lb (122.9 kg)  07/12/20 271 lb (122.9 kg)     Kidney Function Lab Results  Component Value Date/Time   CREATININE 1.05 11/14/2020 01:13 PM   CREATININE 1.18 04/04/2020 11:32 AM   CREATININE 1.27 (H) 03/21/2020 09:43 AM   CREATININE 0.74 08/14/2016 01:11 PM   GFR 65.23 11/14/2020 01:13 PM   GFRNONAA 57 (L) 04/04/2020 11:32 AM   GFRAA 66 04/04/2020 11:32 AM    BMP Latest Ref Rng & Units 11/14/2020 04/04/2020 03/21/2020  Glucose 70 - 99 mg/dL 120(H) 104(H) 107(H)  BUN 6 - 23 mg/dL 25(H) 27 30(H)  Creatinine 0.40 - 1.50 mg/dL 1.05 1.18 1.27(H)  BUN/Creat Ratio 10 - 24 - 23 24(H)  Sodium 135 - 145 mEq/L 137 134 135  Potassium 3.5 - 5.1 mEq/L 4.6 5.1 4.7  Chloride 96 - 112 mEq/L 105 100 101  CO2 19 - 32 mEq/L 24 20 26   Calcium 8.4 - 10.5 mg/dL 8.4 8.6 8.5(L)    . Current antihypertensive regimen:   Furosemide 80 mg 1 tablet daily (alternating between 0.5 and 1 tablet daily)   Isosorbide mononitrate 30 mg 1 tablet daily  Metoprolol succinate 50 mg 1.5 tablet daily with or immediately following a meal  Spironolactone 25 mg 1 tablet daily    . How often are you checking your Blood Pressure? weekly  . Current home BP readings: 108/66 on 01/07/21.   . What recent interventions/DTPs have been made by any provider to improve Blood Pressure  control since last CPP Visit: None.   . Any recent hospitalizations or ED visits since last visit with CPP? No  . What diet changes have been made to improve Blood Pressure Control?  o Patient states he eats very little red meat and hardly any salt. Patient states since his first heart attack 20 years ago he eats mostly chicken and vegetables. Patient does stir fry a lot as well. Patient drinks a lot of water. Patient likes to eat fish too salmon and tuna.   . What exercise is being done to improve your Blood Pressure Control?  o Patient states he gets a lot of walking at home and when he goes out for errands. Patient does not do  yard work due to his allergies but states he gets a lot of activity to be 86.   Adherence Review: Is the patient currently on ACE/ARB medication? No Does the patient have >5 day gap between last estimated fill dates? No  Comprehensive medication review performed; Spoke to patient regarding cholesterol  Lipid Panel    Component Value Date/Time   CHOL 125 03/21/2020 0943   CHOL 171 08/11/2018 0735   TRIG 206 (H) 03/21/2020 0943   TRIG 143 06/30/2006 1132   HDL 30 (L) 03/21/2020 0943   HDL 34 (L) 08/11/2018 0735   LDLCALC 68 03/21/2020 0943    10-year ASCVD risk score: The ASCVD Risk score Mikey Bussing DC Jr., et al., 2013) failed to calculate for the following reasons:   The 2013 ASCVD risk score is only valid for ages 22 to 44   The patient has a prior MI or stroke diagnosis  . Current antihyperlipidemic regimen:   Atorvastatin 20 mg 1 tablet daily   . Previous antihyperlipidemic medications tried: None.   . ASCVD risk enhancing conditions: age >19, HTN and CHF  . What recent interventions/DTPs have been made by any provider to improve Cholesterol control since last CPP Visit: None.   . Any recent hospitalizations or ED visits since last visit with CPP? No  . What diet changes have been made to improve Cholesterol?   o Patient states he eats very  little red meat and hardly any salt. Patient states since his first heart attack 20 years ago he eats mostly chicken and vegetables. Patient does stir fry a lot as well. Patient drinks a lot of water. Patient likes to eat fish too salmon and tuna.   . What exercise is being done to improve Cholesterol?  o Patient states he gets a lot of walking at home and when he goes out for errands. Patient does not do yard work due to his allergies but states he gets a lot of activity to be 86.    Notes:  Spoke with patient and reviewed and reviewed all medications as listed. Patient reports as taking all medications and no issues at this time. Patient tries to check his blood pressure once a week and eat healthy for the most part. I re encouraged patient to stay consistent with blood pressure checks and writing his reading down. Patient verbalized understanding. Patient states his heart failure is stable and he does have a pacemaker. Patient stated that June 25th will be his 62nd wedding anniversary. Patient is overdue for follow up with the clinical pharmacist. Patient was agreeable to 02/13/21 at 9am in person to see [name] the clinical pharmacist. Message sent to CPP to schedule appointment. Patient thanked me for my call.   Adherence Review: Does the patient have >5 day gap between last estimated fill dates? No   Star Rating Drugs:  Atorvastatin 20 mg - last filled on 11/01/20 90DS at [facility].   [place]  Clinical Pharmacist Assistant [phone]

## 2021-01-12 ENCOUNTER — Ambulatory Visit (INDEPENDENT_AMBULATORY_CARE_PROVIDER_SITE_OTHER): Payer: Medicare Other | Admitting: Family Medicine

## 2021-01-12 ENCOUNTER — Encounter: Payer: Self-pay | Admitting: Family Medicine

## 2021-01-12 VITALS — BP 98/68 | HR 67 | Temp 97.7°F | Wt 275.0 lb

## 2021-01-12 DIAGNOSIS — J3089 Other allergic rhinitis: Secondary | ICD-10-CM

## 2021-01-12 NOTE — Progress Notes (Signed)
   Subjective:    Patient ID: Justin [name], male    DOB: 1934-03-29, 87 y.o.   MRN: [id number]  HPI Here with questions about PND and a dry cough he has intermitently. He is convinced this is from his allergies, since hfeels fine otherwise. Currently he takes a generic Zyrtec in the mornings and a Benadryl at night.    Review of Systems  Constitutional: Negative.   HENT: Positive for postnasal drip and rhinorrhea. Negative for congestion, ear pain, sinus pressure, sinus pain, sneezing and sore throat.   Eyes: Negative.   Respiratory: Positive for cough. Negative for chest tightness, shortness of breath and wheezing.        Objective:   Physical Exam Constitutional:      Appearance: Normal appearance.  HENT:     Right Ear: Tympanic membrane, ear canal and external ear normal.     Left Ear: Tympanic membrane, ear canal and external ear normal.     Nose: Nose normal.     Mouth/Throat:     Pharynx: Oropharynx is clear.  Eyes:     Conjunctiva/sclera: Conjunctivae normal.  Pulmonary:     Effort: Pulmonary effort is normal.     Breath sounds: Normal breath sounds.  Lymphadenopathy:     Cervical: No cervical adenopathy.  Neurological:     Mental Status: He is alert.           Assessment & Plan:  Allergies. He will continue the benadryl at night, but in the mornings he will switch to [place] as needed.  [name], MD

## 2021-01-16 ENCOUNTER — Other Ambulatory Visit: Payer: Self-pay | Admitting: Cardiovascular Disease

## 2021-01-20 ENCOUNTER — Other Ambulatory Visit: Payer: Self-pay | Admitting: Family Medicine

## 2021-01-24 ENCOUNTER — Telehealth: Payer: Self-pay | Admitting: Pharmacist

## 2021-01-24 NOTE — Chronic Care Management (AMB) (Signed)
    Chronic Care Management Pharmacy Assistant   Name: Justin [name]  MRN: [id number] DOB: August 28, 1933  01/24/21- Called patient per [name] to reschedule his his 02/13/21 appointment to 02/14/21 at 11:30am in person for a follow up.    Patient aware of appointment date, time, and type of appointment (either telephone or in person). Patient aware to have/bring all medications, supplements, blood pressure and/or blood sugar logs to visit.  Patient is agreeable to new date and time. Message sent to tatjana dellinger to reschedule.   [place]  Clinical Pharmacist Assistant [phone]

## 2021-01-29 ENCOUNTER — Ambulatory Visit (INDEPENDENT_AMBULATORY_CARE_PROVIDER_SITE_OTHER): Payer: Medicare Other

## 2021-01-29 DIAGNOSIS — L570 Actinic keratosis: Secondary | ICD-10-CM | POA: Diagnosis not present

## 2021-01-29 DIAGNOSIS — Z95810 Presence of automatic (implantable) cardiac defibrillator: Secondary | ICD-10-CM

## 2021-01-29 DIAGNOSIS — C44219 Basal cell carcinoma of skin of left ear and external auricular canal: Secondary | ICD-10-CM | POA: Diagnosis not present

## 2021-01-29 DIAGNOSIS — I5042 Chronic combined systolic (congestive) and diastolic (congestive) heart failure: Secondary | ICD-10-CM | POA: Diagnosis not present

## 2021-01-29 DIAGNOSIS — C44311 Basal cell carcinoma of skin of nose: Secondary | ICD-10-CM | POA: Diagnosis not present

## 2021-01-29 NOTE — Progress Notes (Signed)
EPIC Encounter for ICM Monitoring  Patient Name: Justin [name] is a 87 y.o. male Date: 01/29/2021 Primary Care Physican: Laurey Morale, MD Primary Cardiologist:McAlh[place] Electrophysiologist:Klein Bi-V Pacing:65.9% (was 85.1%on 11/14/2020 report) 4/25/2022OfficeWeight: 264lbs                                                           Attempted call to patient and unable to reach.  Left detailed message per DPR regarding transmission. Transmission reviewed.   Optivol thoracic impedancesuggestingpossible fluid accumulation starting 01/24/2021.  Prescribed:   Furosemide80 mgtake1 tablet every other day alternating with0.5tablet (40 mg total)every other day.Per [name] pharmacist, he may take extra 20-40 mg as needed for weight gain/swelling.  KLC 20 mEq take2tablets every other day alternating with 1 tablet every other day.  Spironolactone 25 mg take 1 tablet daily  Labs: 11/14/2020 Creatinine1.05, [place], Potassium4.6, [id number], RXV40.08 A complete set of results can be found in Results Review.  Recommendations: Left voice mail with ICM number and encouraged to call if experiencing any fluid symptoms.  Will encourage to take extra Furosemide if patient reached.  Follow-up plan: ICM clinic phone appointment on7/06/2021. 91 day device clinic remote transmission8/15/2022.  EP/Cardiology Office Visits:Recall 01/08/2021 with Dr Angelena Form. Recall 07/12/2021 with Dr [name]  Copy of ICM check sent to [place]  3 month ICM trend: 01/29/2021.    1 Year ICM trend:       [name], RN 01/29/2021 12:20 PM

## 2021-01-30 ENCOUNTER — Other Ambulatory Visit: Payer: Self-pay | Admitting: Family Medicine

## 2021-01-30 ENCOUNTER — Other Ambulatory Visit: Payer: Self-pay | Admitting: Student

## 2021-01-30 ENCOUNTER — Telehealth: Payer: Self-pay

## 2021-01-30 NOTE — Telephone Encounter (Signed)
Remote ICM transmission received.  Attempted call to patient regarding ICM remote transmission and left detailed message per DPR.  Advised to return call for any fluid symptoms or questions. Next ICM remote transmission scheduled 03/05/2021.

## 2021-01-31 NOTE — Progress Notes (Signed)
Remote ICD transmission.   

## 2021-02-05 ENCOUNTER — Telehealth: Payer: Self-pay | Admitting: Cardiovascular Disease

## 2021-02-05 MED ORDER — METOPROLOL SUCCINATE ER 50 MG PO TB24: 75.0000 mg | ORAL_TABLET | Freq: Every day | ORAL | 1 refills | Status: DC

## 2021-02-05 NOTE — Telephone Encounter (Signed)
Outpatient Medication Detail   Disp Refills Start End   metoprolol succinate (TOPROL-XL) 50 MG 24 hr tablet 135 tablet 1 02/05/2021    Sig - Route: Take 1.5 tablets (75 mg total) by mouth daily. Take with or immediately following a meal. - Oral   Sent to pharmacy as: metoprolol succinate (TOPROL-XL) 50 MG 24 hr tablet   E-Prescribing Status: Receipt confirmed by pharmacy (02/05/2021  9:31 AM EDT)     [place], [address]

## 2021-02-05 NOTE — Telephone Encounter (Signed)
Pt's medication was sent to pt's pharmacy as requested. Confirmation received.  °

## 2021-02-05 NOTE — Telephone Encounter (Signed)
*  STAT* If patient is at the pharmacy, call can be transferred to refill team.   1. Which medications need to be refilled? (please list name of each medication and dose if known) metoprolol succinate (TOPROL-XL) 50 MG 24 hr tablet  2. Which pharmacy/location (including street and city if local pharmacy) is medication to be sent to? [facility], [place] [address]  3. Do they need a 30 day or 90 day supply? 90 day supply

## 2021-02-12 DIAGNOSIS — L249 Irritant contact dermatitis, unspecified cause: Secondary | ICD-10-CM | POA: Diagnosis not present

## 2021-02-12 DIAGNOSIS — L821 Other seborrheic keratosis: Secondary | ICD-10-CM | POA: Diagnosis not present

## 2021-02-13 ENCOUNTER — Telehealth: Payer: Self-pay | Admitting: Pharmacist

## 2021-02-13 NOTE — Chronic Care Management (AMB) (Signed)
Date- Patient called to remind of appointment with Watt Climes on 06.22.2022 at 11:30 am  Patient aware of appointment date, time, and type of appointment (telephone). Patient aware to have/bring all medications, supplements, blood pressure and/or blood sugar logs to visit.  Questions: Have you had any recent office visit or specialist visit outside of [place]? NO Are there any concerns you would like to discuss during your office visit? No Are you having any problems obtaining your medications? (Whether it pharmacy issues or cost) No I Star Rating Drug: Medication Dispensed  Quantity Pharmacy  Atorvastatin 20 mg 06.01.2022 [place] 97-103 MG 06.20.2022 [address]    Any gaps in medications fill history? No   Maia Breslow, [place] Pharmacist Assistant [phone]

## 2021-02-14 ENCOUNTER — Ambulatory Visit (INDEPENDENT_AMBULATORY_CARE_PROVIDER_SITE_OTHER): Payer: Medicare Other | Admitting: Pharmacist

## 2021-02-14 DIAGNOSIS — I5033 Acute on chronic diastolic (congestive) heart failure: Secondary | ICD-10-CM

## 2021-02-14 DIAGNOSIS — I10 Essential (primary) hypertension: Secondary | ICD-10-CM | POA: Diagnosis not present

## 2021-02-14 DIAGNOSIS — E785 Hyperlipidemia, unspecified: Secondary | ICD-10-CM | POA: Diagnosis not present

## 2021-02-14 NOTE — Progress Notes (Signed)
Chronic Care Management Pharmacy Note  02/16/2021 Name:  Justin [name] MRN:  [id number] DOB:  1934/03/05  Summary: Uric acid is above goal of < 6 LDL is at goal < 70 Patient has had recent lower BP readings in office  Recommendations/Changes made from today's visit: -Recommended for patient to monitor BP at home regularly. -Recommend repeat uric acid level and if > 6, consider switching colchicine to allopurinol for uric acid lowering benefit.  Plan: -Mailed patient assistance applications for Pradaxa and Entresto -Follow up BP assessment in 2 months   Subjective: Justin [name] is an 87 y.o. year old male who is a primary patient of Laurey Morale, MD.  The CCM team was consulted for assistance with disease management and care coordination needs.    Engaged with patient by telephone for follow up visit in response to provider referral for pharmacy case management and/or care coordination services.   Consent to Services:  The patient was given information about Chronic Care Management services, agreed to services, and gave verbal consent prior to initiation of services.  Please see initial visit note for detailed documentation.   Patient Care Team: Laurey Morale, MD as PCP - General Angelena Form [name], MD as PCP - Cardiology (Cardiology) Festus Aloe, MD as Consulting Physician (Urology) Viona Gilmore, [facility] as Pharmacist (Pharmacist)  Recent office visits: 01/12/21 [name], MD: Patient presented for allergies. Continued Benadryl at night and switched Zyrtec to Xyzal.   10/27/20 [name] MD (PCP) - seen for GERD and other chronic conditions. Changed famotidine to $RemoveBefor'20mg'itocDzXhXggF$  daily at bedtime. Go back on omeprazole at breakfast and before supper. Follow up as needed.   Recent consult visits: 11/03/20 Meredith Pel MD (Orthopedic Surgery) - seen for rib pain on right side. Patient started on methocarbamol $RemoveBeforeDE'500mg'qbvawJnXqbYwWWI$  and oxycodone $RemoveBefor'5mg'qRSuddZqRiFf$ . Follow up if symptoms worsen  or dont improve.   10/27/20 Meredith Pel MD (Orthopedic Surgery) - seen for rib pain on right side and laceration of right eye lid. No medication changes. No follow up noted.   09/01/20 Virl Axe MD (Cardiology) remote defib check.   07/31/20 Virl Axe MD (Cardiology) remote defib check.   Hospital visits: None in previous 6 months   Objective:  Lab Results  Component Value Date   CREATININE 1.05 11/14/2020   BUN 25 (H) 11/14/2020   GFR 65.23 11/14/2020   GFRNONAA 57 (L) 04/04/2020   GFRAA 66 04/04/2020   NA 137 11/14/2020   K 4.6 11/14/2020   CALCIUM 8.4 11/14/2020   CO2 24 11/14/2020   GLUCOSE 120 (H) 11/14/2020    Lab Results  Component Value Date/Time   HGBA1C 6.1 (H) 03/21/2020 09:43 AM   HGBA1C 6.6 (H) 03/11/2019 08:58 AM   GFR 65.23 11/14/2020 01:13 PM   GFR 72.24 03/11/2019 08:58 AM    Last diabetic Eye exam: No results found for: HMDIABEYEEXA  Last diabetic Foot exam: No results found for: HMDIABFOOTEX   Lab Results  Component Value Date   CHOL 125 03/21/2020   HDL 30 (L) 03/21/2020   LDLCALC 68 03/21/2020   TRIG 206 (H) 03/21/2020   CHOLHDL 4.2 03/21/2020    Hepatic Function Latest Ref Rng & Units 11/14/2020 03/21/2020 03/11/2019  Total Protein 6.0 - 8.3 g/dL 6.2 6.5 7.0  Albumin 3.5 - 5.2 g/dL 3.5 - 3.7  AST 0 - 37 U/L $Remo'16 21 24  'LAale$ ALT 0 - 53 U/L $Remo'12 18 20  'SqbHx$ Alk Phosphatase 39 - 117 U/L 115 -  84  Total Bilirubin 0.2 - 1.2 mg/dL 0.8 0.9 0.7  Bilirubin, Direct 0.0 - 0.3 mg/dL 0.2 0.2 0.2    Lab Results  Component Value Date/Time   TSH 2.80 11/14/2020 01:13 PM   TSH 3.55 03/21/2020 09:43 AM   FREET4 0.99 11/14/2020 01:13 PM   FREET4 1.0 03/21/2020 09:43 AM    CBC Latest Ref Rng & Units 11/14/2020 03/21/2020 11/09/2019  WBC 4.0 - 10.5 K/uL 10.4 10.3 9.9  Hemoglobin 13.0 - 17.0 g/dL 12.8(L) 14.1 13.1  Hematocrit 39.0 - 52.0 % 38.7(L) 43.5 40.5  Platelets 150.0 - 400.0 K/uL 223.0 201 224    Lab Results  Component Value Date/Time   VD25OH 33  12/12/2008 11:27 PM    Clinical ASCVD: Yes  The ASCVD Risk score Mikey Bussing DC Jr., et al., 2013) failed to calculate for the following reasons:   The 2013 ASCVD risk score is only valid for ages 30 to 62   The patient has a prior MI or stroke diagnosis    Depression screen [facility] 2/9 03/11/2019  Decreased Interest 0  Down, Depressed, Hopeless 0  PHQ - 2 Score 0  Some recent data might be hidden     Social History   Tobacco Use  Smoking Status Former   Packs/day: 3.50   Years: 13.00   Pack years: 45.50   Types: Cigarettes   Quit date: 1963   Years since quitting: 59.5  Smokeless Tobacco Never   BP Readings from Last 3 Encounters:  01/12/21 98/68  10/27/20 (!) 98/58  07/12/20 (!) 98/50   Pulse Readings from Last 3 Encounters:  01/12/21 67  10/27/20 (!) 56  07/12/20 62   Wt Readings from Last 3 Encounters:  01/12/21 275 lb (124.7 kg)  10/27/20 268 lb (121.6 kg)  07/12/20 271 lb (122.9 kg)   BMI Readings from Last 3 Encounters:  01/12/21 43.72 kg/m  10/27/20 42.61 kg/m  07/12/20 43.09 kg/m    Assessment/Interventions: Review of patient past medical history, allergies, medications, health status, including review of consultants reports, laboratory and other test data, was performed as part of comprehensive evaluation and provision of chronic care management services.   SDOH:  (Social Determinants of Health) assessments and interventions performed: Yes SDOH Interventions    Flowsheet Row Most Recent Value  SDOH Interventions   Financial Strain Interventions Other (Comment)  [working on patient assistance]      SDOH Screenings   Alcohol Screen: Not on file  Depression (PHQ2-9): Not on file  Financial Resource Strain: Low Risk    Difficulty of Paying Living Expenses: Not very hard  Food Insecurity: Not on file  Housing: Not on file  Physical Activity: Not on file  Social Connections: Not on file  Stress: Not on file  Tobacco Use: Medium Risk   Smoking Tobacco  Use: Former   Smokeless Tobacco Use: Never  Transportation Needs: Not on file    [place]  Allergies  Allergen Reactions   Peanut-Containing Drug Products Anaphylaxis   Sulfonamide Derivatives Anaphylaxis   Amlodipine Swelling    Swelling in ankles   Eliquis [Apixaban] Other (See Comments)    Back/hip pain   Lisinopril Cough    cough   Xarelto [Rivaroxaban] Other (See Comments)    Back/hip pain    Medications Reviewed Today     Reviewed by Viona Gilmore, [facility] (Pharmacist) on 02/14/21 at 1157  Med List Status: <None>   Medication Order Taking? Sig Documenting Provider Last Dose Status Informant  aspirin 81 MG tablet [id number]  Take 1 tablet (81 mg total) by mouth daily. Magnant, Gerrianne Scale, PA-C  Active   atorvastatin (LIPITOR) 20 MG tablet [id number]  TAKE 1 TABLET ONCE DAILY. Laurey Morale, MD  Active   azelastine (ASTELIN) 0.1 % nasal spray [id number]  Place 2 sprays into both nostrils 2 (two) times daily. Use in each nostril as directed Julian Hy, DO  Active   Azelastine HCl 0.15 % SOLN [id number]  Place 2 sprays into the nose in the morning and at bedtime. Julian Hy, DO  Active   benzonatate (TESSALON) 200 MG capsule [id number]  Take 1 capsule (200 mg total) by mouth 2 (two) times daily as needed for cough. Julian Hy, DO  Active   colchicine 0.6 MG tablet [id number]  Take 1 tablet (0.6 mg total) by mouth daily. Magnant, Gerrianne Scale, PA-C  Active   dabigatran (PRADAXA) 150 MG CAPS capsule [id number]  TAKE (1) CAPSULE TWICE DAILY. Laurey Morale, MD  Active   diphenhydrAMINE (BENADRYL) 25 MG tablet [id number]  Take 25 mg by mouth at bedtime.  [provider]  Active   EPINEPHrine 0.3 mg/0.3 mL IJ SOAJ injection [id number]  Inject 0.3 mLs (0.3 mg total) into the muscle as needed for anaphylaxis. Kennith Gain, MD  Active Self  famotidine (PEPCID) 20 MG tablet [id number]  Take 1 tablet (20 mg total) by mouth at bedtime. Laurey Morale, MD  Active    fluorouracil (EFUDEX) 5 % cream [id number]  Apply topically 2 (two) times daily. [provider]  Active   furosemide (LASIX) 80 MG tablet [id number]  Take one tablet daily alternating with a HALF tablet every other day Burnell Blanks, MD  Active   hydrOXYzine (ATARAX/VISTARIL) 10 MG tablet [id number]  Take 10 mg by mouth at bedtime.  [provider]  Active   isosorbide mononitrate (IMDUR) 30 MG 24 hr tablet [id number]  TAKE 1 TABLET ONCE DAILY. Laurey Morale, MD  Active   Ketotifen Fumarate (ZADITOR OP) [id number]  Apply 1 drop to eye daily as needed (itchy eyes).  [provider]  Active Self  levocetirizine (XYZAL) 5 MG tablet [id number] Yes Take 5 mg by mouth daily. [provider] Taking Active   levothyroxine (SYNTHROID) 100 MCG tablet [id number]  Take 1 tablet (100 mcg total) by mouth daily. Laurey Morale, MD  Active   meclizine (ANTIVERT) 25 MG tablet [id number]  Take 1 tablet (25 mg total) by mouth every 4 (four) hours as needed for dizziness. Laurey Morale, MD  Active   methocarbamol (ROBAXIN) 500 MG tablet [id number]  Take 1 tablet (500 mg total) by mouth every 8 (eight) hours as needed for muscle spasms. Meredith Pel, MD  Active   metoCLOPramide [facility]) 10 MG tablet [id number]  Take 1 tablet every morning with breakfast Laurey Morale, MD  Active   metoprolol succinate (TOPROL-XL) 50 MG 24 hr tablet [id number]  Take 1.5 tablets (75 mg total) by mouth daily. Take with or immediately following a meal. Burnell Blanks, MD  Active   montelukast (SINGULAIR) 10 MG tablet [id number]  TAKE 1 TABLET ONCE DAILY IN THE EVENING. Julian Hy, DO  Active   Multiple Vitamins-Minerals (PRESERVISION AREDS PO) [id number]  Take 2 tablets by mouth every morning. [provider]  Active Self  omeprazole (PRILOSEC) 20 MG capsule [id number]  TAKE (1) CAPSULE TWICE DAILY. Laurey Morale, MD  Active   oxyCODONE (OXY  IR/ROXICODONE) 5 MG immediate  release tablet [id number]  Take 1 tablet (5 mg total) by mouth every 12 (twelve) hours as needed for severe pain. Meredith Pel, MD  Active   potassium bicarbonate (K-LYTE) 25 MEQ disintegrating tablet [id number]  Take 25 mEq by mouth 2 (two) times daily. [provider]  Active   potassium chloride SA (KLOR-CON) 20 MEQ tablet [id number]  Take 2 tablets daily alternating with 1 tablet every other day Burnell Blanks, MD  Active   sacubitril-valsartan (ENTRESTO) 97-103 MG [id number]  Take 1 tablet by mouth 2 (two) times daily. Burnell Blanks, MD  Active   spironolactone (ALDACTONE) 25 MG tablet [id number]  TAKE 1 TABLET ONCE DAILY. Burnell Blanks, MD  Active   triamcinolone cream (KENALOG) 0.1 % [id number]  Apply 1 application topically daily as needed for dry skin. [provider]  Active Self           Med Note Nat Christen   Fri Oct 09, 2018 12:40 PM)    valACYclovir (VALTREX) 500 MG tablet [id number]  Take 500 mg by mouth 2 (two) times daily as needed (cold sores). 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   Severe aortic valve stenosis 03/04/2017   Hypokalemia due to loss of potassium with diuresis 01/10/2017   Aortic stenosis 09/38/1829   Systolic heart failure ([place]) 01/10/2017   Acute on chronic diastolic CHF (congestive heart failure) ([place]) 01/07/2017   Hypothyroidism 12/23/2016   Prostate cancer ([place])    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   Edema of both legs 09/01/2014   Asthma, intermittent 09/01/2014    Aortic valve disorders 01/01/2011   Coronary artery disease involving native coronary artery of native heart without angina pectoris    GOUT 09/12/2009   CAD, AUTOLOGOUS BYPASS GRAFT 05/10/2009   MURMUR 05/08/2009   BRUIT 05/08/2009   MUSCLE CRAMPS 12/12/2008   KNEE SPRAIN 09/21/2008   Osteoarthritis 07/07/2008   CONTUSION, HIP 05/13/2008   BPH with urinary obstruction 02/01/2008   LOW BACK PAIN SYNDROME 02/01/2008   TESTOSTERONE DEFICIENCY 07/03/2007   Hyperlipidemia 07/03/2007   Essential hypertension 07/03/2007   MYOCARDIAL INFARCTION, HX OF 07/03/2007   Allergic rhinitis 07/03/2007   COLONIC POLYPS, HX OF 07/03/2007   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    Immunization History  Administered Date(s) Administered   Fluad Quad(high Dose 65+) 05/06/2019, 05/30/2020   Hep A / Hep B 04/06/2012, 10/07/2012   Hepatitis B 05/07/2012   Influenza Split 06/28/2013   Influenza Whole 06/05/2007, 05/13/2008, 06/06/2009, 05/30/2010   Influenza, High Dose Seasonal PF 05/15/2018   Influenza,inj,Quad PF,6+ Mos 05/20/2016   Influenza-Unspecified 05/25/2014, 07/08/2015, 05/06/2017   PFIZER(Purple Top)SARS-COV-2 Vaccination 09/28/2019, 10/12/2019, 05/30/2020   Pneumococcal Conjugate-13 09/18/2015   Pneumococcal Polysaccharide-23 05/13/2008, 05/25/2014   Td 05/30/2010   Tdap 10/27/2020   Zoster Recombinat (Shingrix) 05/18/2018, 10/16/2018   Zoster, Live 10/21/2007    Conditions to be addressed/monitored:  Hypertension, Hyperlipidemia, Heart Failure, Coronary Artery Disease, GERD, Hypothyroidism, Gout, and Allergic Rhinitis  Care Plan : CCM Pharmacy Care Plan  Updates made  by Viona Gilmore, [place] since 02/16/2021 12:00 AM     Problem: Problem: Hypertension, Hyperlipidemia, Heart Failure, Coronary Artery Disease, and Allergic Rhinitis      Long-Range Goal: Patient-Specific Goal   Start Date: 02/14/2021  Expected End Date: 02/14/2022  This Visit's Progress: On track  Priority: High  Note:    Current Barriers:  Unable to independently afford treatment regimen Unable to independently monitor therapeutic efficacy  Pharmacist Clinical Goal(s):  Patient will verbalize ability to afford treatment regimen achieve adherence to monitoring guidelines and medication adherence to achieve therapeutic efficacy through collaboration with PharmD and provider.   Interventions: 1:1 collaboration with Laurey Morale, MD regarding development and update of comprehensive plan of care as evidenced by provider attestation and co-signature Inter-disciplinary care team collaboration (see longitudinal plan of care) Comprehensive medication review performed; medication list updated in electronic medical record  Hypertension (BP goal <130/80) -Controlled -Current treatment: Furosemide 80 mg 1 tablet daily (alternating between 0.5 and 1 tablet daily)  Isosorbide mononitrate 30 mg 1 tablet daily Metoprolol succinate 50 mg 1.5 tablet daily with or immediately following a meal Spironolactone 25 mg 1 tablet daily -Medications previously tried: losartan  -Current home readings: not checking consistently -Current dietary habits: did not discuss -Current exercise habits: did not discuss -Denies hypotensive/hypertensive symptoms -Educated on Importance of home blood pressure monitoring; Proper BP monitoring technique; Symptoms of hypotension and importance of maintaining adequate hydration; -Counseled to monitor BP at home weekly, document, and provide log at future appointments -Counseled on diet and exercise extensively Recommended to continue current medication  Hyperlipidemia: (LDL goal < 70) -Controlled -Current treatment: Atorvastatin 20 mg 1 tablet daily -Medications previously tried: none  -Current dietary patterns: did not discuss -Current exercise habits: did not discuss -Educated on Cholesterol goals;  Importance of limiting foods high in cholesterol; Exercise goal of 150 minutes per  week; -Counseled on diet and exercise extensively Recommended to continue current medication  CAD (Goal: prevent heart events) -Controlled -Current treatment  Aspirin 81 mg 1 tablet daily Pradaxa 150 mg 1 capsule twice daily -Medications previously tried: n/a  -Recommended to continue current medication Assessed patient finances. Patient may qualify for Pradaxa patient assistance. Will send application.   Heart Failure (Goal: manage symptoms and prevent exacerbations) -Controlled -Last ejection fraction: 30-35% (Date: 04/2019) -HF type: Combined Systolic and Diastolic -NYHA Class: III (marked limitation of activity) -AHA HF Stage: C (Heart disease and symptoms present) -Current treatment: Isosorbide mononitrate 30 mg 1 tablet daily Furosemide 80 mg 1 tablet daily (alternating between 0.5 and 1 tablet daily)  Metoprolol succinate 50 mg 1.5 tablets by mouth daily with or immediately following a meal Entresto 97-103 mg 1 tablet twice daily Spironolactone 25 mg 1 tablet daily -Medications previously tried: losartan  -Current home BP/HR readings: not checking regularly -Current dietary habits: did not discuss -Current exercise habits: did not discuss -Educated on Benefits of medications for managing symptoms and prolonging life Importance of weighing daily; if you gain more than 3 pounds in one day or 5 pounds in one week, call cardiologist -Counseled on diet and exercise extensively Recommended to continue current medication  Diabetes (A1c goal <7%) -Controlled -Current medications: No medications -Medications previously tried: none  -Current home glucose readings fasting glucose: does not need to check post prandial glucose: does not need to check -Denies hypoglycemic/hyperglycemic symptoms -Current meal patterns:  breakfast: did not discuss  lunch: did not discuss  dinner: did not discuss snacks: did not discuss drinks: did not discuss -  Current exercise: did not  discuss -Educated on A1c and blood sugar goals; -Counseled to check feet daily and get yearly eye exams -Counseled on diet and exercise extensively Recommended repeat A1c  GERD (Goal: minimize symptoms) -Controlled -Current treatment  Pepcid Complete (Famotidine + Calcium + Magnesium) 2 tablets at bedtime Omeprazole 20 mg 1 capsule twice daily Probiotic daily -Medications previously tried: n/a  -Recommended to continue current medication  Hypothyroidism (Goal: TSH 0.35-4.5) -Controlled -Current treatment  Levothyroxine 100 mcg 1 tablet daily -Medications previously tried: none -Recommended to continue current medication  Allergic rhinitis (Goal: minimize symptoms) -Not ideally controlled -Current treatment  Azelastine 0.15% 2 sprays into the nose in the morning and at bedtime Xyzal 5 mg 1 tablet daily (in AM) Flonase 2 sprays into both nostrils daily Montelukast 10 mg 1 tablet in the evening Benadryl 25 mg 1 tablet at bedtime Cetirizine 10 mg - 1/2 tablet at bedtime Benzonatate 200 mg 1 capsule every 8 hrs as needed for cough -Medications previously tried: n/a  -Recommended limiting use of Benadryl and caution with using 2 antihistamines (cetirizine and Xyzal)  Gout (Goal: prevent flare ups) -Controlled -Current treatment  Colchicine 0.6 mg tablet daily -Medications previously tried: n/a  -Recommended to continue current medication Recommended repeat uric acid level as previous was elevated and consider allopurinol therapy to replace colchicine  Health Maintenance -Vaccine gaps: COVID booster -Current therapy:  Triamcinolone 0.1% cream once daily as needed for dry skin Valtrex 500 mg 1 tablet twice daily as needed for cold sores -Educated on Cost vs benefit of each product must be carefully weighed by individual consumer -Patient is satisfied with current therapy and denies issues -Recommended to continue current medication  Patient Goals/Self-Care  Activities Patient will:  - take medications as prescribed check blood pressure weekly, document, and provide at future appointments  Follow Up Plan: Telephone follow up appointment with care management team member scheduled for: 6 months        Medication Assistance: Application for Pradaxa and Entresto  medication assistance program. in process.  Anticipated assistance start date 03/18/21.  See plan of care for additional detail.  Compliance/Adherence/Medication fill history: Care Gaps: COVID booster  Star-Rating Drugs: Atorvastatin 20 mg - last filled 01/24/21 for 90 ds Entresto 97-103 mg - last filled 02/12/21 for 90 ds  Patient's preferred pharmacy is:  [facility], [place] [address] Phone: [phone] Fax: [phone]  Uses pill box? Yes Pt endorses 99% compliance  We discussed: Current pharmacy is preferred with insurance plan and patient is satisfied with pharmacy services Patient decided to: Continue current medication management strategy  Care Plan and Follow Up Patient Decision:  Patient agrees to Care Plan and Follow-up.  Plan: Telephone follow up appointment with care management team member scheduled for:  6 months  [name], PharmD, [place] Pharmacist [place] [facility] [place] [phone]

## 2021-02-16 ENCOUNTER — Other Ambulatory Visit: Payer: Self-pay | Admitting: Family Medicine

## 2021-02-16 NOTE — Patient Instructions (Addendum)
Hi [name],  It was great to get to meet you over the telephone! Below is a summary of some of the topics we discussed.   I will be mailing out your patient assistance applications over the next week. Please let me know if you do not receive in 2 weeks time. I also scheduled you for a follow up on Dec 19th @3pm  via telephone. Please let me know if that will not work or if you have questions/concerns in the meantime!  Best, Maddie  [name], PharmD, [facility]   Visit Information   Goals Addressed   None    Patient Care Plan: CCM Pharmacy Care Plan     Problem Identified: Problem: Hypertension, Hyperlipidemia, Heart Failure, Coronary Artery Disease, and Allergic Rhinitis      Long-Range Goal: Patient-Specific Goal   Start Date: 02/14/2021  Expected End Date: 02/14/2022  This Visit's Progress: On track  Priority: High  Note:   Current Barriers:  Unable to independently afford treatment regimen Unable to independently monitor therapeutic efficacy  Pharmacist Clinical Goal(s):  Patient will verbalize ability to afford treatment regimen achieve adherence to monitoring guidelines and medication adherence to achieve therapeutic efficacy through collaboration with PharmD and provider.   Interventions: 1:1 collaboration with Laurey Morale, MD regarding development and update of comprehensive plan of care as evidenced by provider attestation and co-signature Inter-disciplinary care team collaboration (see longitudinal plan of care) Comprehensive medication review performed; medication list updated in electronic medical record  Hypertension (BP goal <130/80) -Controlled -Current treatment: Furosemide 80 mg 1 tablet daily (alternating between 0.5 and 1 tablet daily)  Isosorbide mononitrate 30 mg 1 tablet daily Metoprolol succinate 50 mg 1.5 tablet daily with or immediately following a meal Spironolactone 25 mg 1  tablet daily -Medications previously tried: losartan  -Current home readings: not checking consistently -Current dietary habits: did not discuss -Current exercise habits: did not discuss -Denies hypotensive/hypertensive symptoms -Educated on Importance of home blood pressure monitoring; Proper BP monitoring technique; Symptoms of hypotension and importance of maintaining adequate hydration; -Counseled to monitor BP at home weekly, document, and provide log at future appointments -Counseled on diet and exercise extensively Recommended to continue current medication  Hyperlipidemia: (LDL goal < 70) -Controlled -Current treatment: Atorvastatin 20 mg 1 tablet daily -Medications previously tried: none  -Current dietary patterns: did not discuss -Current exercise habits: did not discuss -Educated on Cholesterol goals;  Importance of limiting foods high in cholesterol; Exercise goal of 150 minutes per week; -Counseled on diet and exercise extensively Recommended to continue current medication  CAD (Goal: prevent heart events) -Controlled -Current treatment  Aspirin 81 mg 1 tablet daily Pradaxa 150 mg 1 capsule twice daily -Medications previously tried: n/a  -Recommended to continue current medication Assessed patient finances. Patient may qualify for Pradaxa patient assistance. Will send application.   Heart Failure (Goal: manage symptoms and prevent exacerbations) -Controlled -Last ejection fraction: 30-35% (Date: 04/2019) -HF type: Combined Systolic and Diastolic -NYHA Class: III (marked limitation of activity) -AHA HF Stage: C (Heart disease and symptoms present) -Current treatment: Isosorbide mononitrate 30 mg 1 tablet daily Furosemide 80 mg 1 tablet daily (alternating between 0.5 and 1 tablet daily)  Metoprolol succinate 50 mg 1.5 tablets by mouth daily with or immediately following a meal Entresto 97-103 mg 1 tablet twice daily Spironolactone 25 mg 1 tablet  daily -Medications previously tried: losartan  -Current home BP/HR readings: not checking regularly -Current dietary habits: did not discuss -Current  exercise habits: did not discuss -Educated on Benefits of medications for managing symptoms and prolonging life Importance of weighing daily; if you gain more than 3 pounds in one day or 5 pounds in one week, call cardiologist -Counseled on diet and exercise extensively Recommended to continue current medication  Diabetes (A1c goal <7%) -Controlled -Current medications: No medications -Medications previously tried: none  -Current home glucose readings fasting glucose: does not need to check post prandial glucose: does not need to check -Denies hypoglycemic/hyperglycemic symptoms -Current meal patterns:  breakfast: did not discuss  lunch: did not discuss  dinner: did not discuss snacks: did not discuss drinks: did not discuss -Current exercise: did not discuss -Educated on A1c and blood sugar goals; -Counseled to check feet daily and get yearly eye exams -Counseled on diet and exercise extensively Recommended repeat A1c  GERD (Goal: minimize symptoms) -Controlled -Current treatment  Pepcid Complete (Famotidine + Calcium + Magnesium) 2 tablets at bedtime Omeprazole 20 mg 1 capsule twice daily Probiotic daily -Medications previously tried: n/a  -Recommended to continue current medication  Hypothyroidism (Goal: TSH 0.35-4.5) -Controlled -Current treatment  Levothyroxine 100 mcg 1 tablet daily -Medications previously tried: none -Recommended to continue current medication  Allergic rhinitis (Goal: minimize symptoms) -Not ideally controlled -Current treatment  Azelastine 0.15% 2 sprays into the nose in the morning and at bedtime Xyzal 5 mg 1 tablet daily (in AM) Flonase 2 sprays into both nostrils daily Montelukast 10 mg 1 tablet in the evening Benadryl 25 mg 1 tablet at bedtime Cetirizine 10 mg - 1/2 tablet at  bedtime Benzonatate 200 mg 1 capsule every 8 hrs as needed for cough -Medications previously tried: n/a  -Recommended limiting use of Benadryl and caution with using 2 antihistamines (cetirizine and Xyzal)  Gout (Goal: prevent flare ups) -Controlled -Current treatment  Colchicine 0.6 mg tablet daily -Medications previously tried: n/a  -Recommended to continue current medication Recommended repeat uric acid level as previous was elevated and consider allopurinol therapy to replace colchicine  Health Maintenance -Vaccine gaps: COVID booster -Current therapy:  Triamcinolone 0.1% cream once daily as needed for dry skin Valtrex 500 mg 1 tablet twice daily as needed for cold sores -Educated on Cost vs benefit of each product must be carefully weighed by individual consumer -Patient is satisfied with current therapy and denies issues -Recommended to continue current medication  Patient Goals/Self-Care Activities Patient will:  - take medications as prescribed check blood pressure weekly, document, and provide at future appointments  Follow Up Plan: Telephone follow up appointment with care management team member scheduled for: 6 months       Patient verbalizes understanding of instructions provided today and agrees to view in [place].  Telephone follow up appointment with pharmacy team member scheduled for: December 19th at [facility]

## 2021-02-21 DIAGNOSIS — C44311 Basal cell carcinoma of skin of nose: Secondary | ICD-10-CM | POA: Diagnosis not present

## 2021-02-28 ENCOUNTER — Telehealth: Payer: Self-pay | Admitting: Pharmacist

## 2021-02-28 DIAGNOSIS — H353221 Exudative age-related macular degeneration, left eye, with active choroidal neovascularization: Secondary | ICD-10-CM | POA: Diagnosis not present

## 2021-02-28 NOTE — Chronic Care Management (AMB) (Signed)
Chronic Care Management Pharmacy Assistant   Name: Justin [name]  MRN: [id number] DOB: 1933-10-22  Per [name] the clinical pharmacist I completed Patient Assistance applications for Pradaxa and Entresto. Applications were sent to [name] PTM to print and mail to patient. Per [name] patient is aware that applications are coming in the mail and do not need to call patient.   Medications: Outpatient Encounter Medications as of 02/28/2021  Medication Sig   aspirin 81 MG tablet Take 1 tablet (81 mg total) by mouth daily.   atorvastatin (LIPITOR) 20 MG tablet TAKE 1 TABLET ONCE DAILY.   azelastine (ASTELIN) 0.1 % nasal spray Place 2 sprays into both nostrils 2 (two) times daily. Use in each nostril as directed   Azelastine HCl 0.15 % SOLN Place 2 sprays into the nose in the morning and at bedtime.   benzonatate (TESSALON) 200 MG capsule Take 1 capsule (200 mg total) by mouth 2 (two) times daily as needed for cough.   colchicine 0.6 MG tablet Take 1 tablet (0.6 mg total) by mouth daily.   dabigatran (PRADAXA) 150 MG CAPS capsule TAKE (1) CAPSULE TWICE DAILY.   diphenhydrAMINE (BENADRYL) 25 MG tablet Take 25 mg by mouth at bedtime.    EPINEPHrine 0.3 mg/0.3 mL IJ SOAJ injection Inject 0.3 mLs (0.3 mg total) into the muscle as needed for anaphylaxis.   famotidine (PEPCID) 20 MG tablet Take 1 tablet (20 mg total) by mouth at bedtime.   fluorouracil (EFUDEX) 5 % cream Apply topically 2 (two) times daily.   furosemide (LASIX) 80 MG tablet Take one tablet daily alternating with a HALF tablet every other day   hydrOXYzine (ATARAX/VISTARIL) 10 MG tablet Take 10 mg by mouth at bedtime.    isosorbide mononitrate (IMDUR) 30 MG 24 hr tablet TAKE 1 TABLET ONCE DAILY.   Ketotifen Fumarate (ZADITOR OP) Apply 1 drop to eye daily as needed (itchy eyes).    levocetirizine (XYZAL) 5 MG tablet Take 5 mg by mouth daily.   levothyroxine (SYNTHROID) 100 MCG tablet Take 1 tablet (100 mcg  total) by mouth daily.   meclizine (ANTIVERT) 25 MG tablet Take 1 tablet (25 mg total) by mouth every 4 (four) hours as needed for dizziness.   methocarbamol (ROBAXIN) 500 MG tablet Take 1 tablet (500 mg total) by mouth every 8 (eight) hours as needed for muscle spasms.   metoCLOPramide (REGLAN) 10 MG tablet Take 1 tablet every morning with breakfast   metoprolol succinate (TOPROL-XL) 50 MG 24 hr tablet Take 1.5 tablets (75 mg total) by mouth daily. Take with or immediately following a meal.   montelukast (SINGULAIR) 10 MG tablet TAKE 1 TABLET ONCE DAILY IN THE EVENING.   Multiple Vitamins-Minerals (PRESERVISION AREDS PO) Take 2 tablets by mouth every morning.   omeprazole (PRILOSEC) 20 MG capsule TAKE (1) CAPSULE TWICE DAILY.   oxyCODONE (OXY IR/ROXICODONE) 5 MG immediate release tablet Take 1 tablet (5 mg total) by mouth every 12 (twelve) hours as needed for severe pain.   potassium bicarbonate (K-LYTE) 25 MEQ disintegrating tablet Take 25 mEq by mouth 2 (two) times daily.   potassium chloride SA (KLOR-CON) 20 MEQ tablet Take 2 tablets daily alternating with 1 tablet every other day   sacubitril-valsartan (ENTRESTO) 97-103 MG Take 1 tablet by mouth 2 (two) times daily.   spironolactone (ALDACTONE) 25 MG tablet TAKE 1 TABLET ONCE DAILY.   triamcinolone cream (KENALOG) 0.1 % Apply 1 application topically daily as needed for dry skin.  valACYclovir (VALTREX) 500 MG tablet Take 500 mg by mouth 2 (two) times daily as needed (cold sores).   No facility-administered encounter medications on file as of 02/28/2021.    Star Rating Drugs:  Atorvastatin 20mg  - last filled on 01/24/21 90DS at [facility] Delene Loll) 97-103mg  - PAP  [place]  Clinical Pharmacist Assistant [phone]

## 2021-03-05 ENCOUNTER — Ambulatory Visit (INDEPENDENT_AMBULATORY_CARE_PROVIDER_SITE_OTHER): Payer: Medicare Other

## 2021-03-05 DIAGNOSIS — I5042 Chronic combined systolic (congestive) and diastolic (congestive) heart failure: Secondary | ICD-10-CM | POA: Diagnosis not present

## 2021-03-05 DIAGNOSIS — Z95810 Presence of automatic (implantable) cardiac defibrillator: Secondary | ICD-10-CM

## 2021-03-06 NOTE — Progress Notes (Signed)
VIALS MADE. EXP 03-06-22 

## 2021-03-07 DIAGNOSIS — D485 Neoplasm of uncertain behavior of skin: Secondary | ICD-10-CM | POA: Diagnosis not present

## 2021-03-08 ENCOUNTER — Telehealth: Payer: Self-pay

## 2021-03-08 NOTE — Telephone Encounter (Signed)
LMOVM for patient to send missed ICM transmission. 

## 2021-03-09 ENCOUNTER — Telehealth: Payer: Self-pay

## 2021-03-09 NOTE — Progress Notes (Signed)
EPIC Encounter for ICM Monitoring  Patient Name: Justin [name] is a 87 y.o. male Date: 03/09/2021 Primary Care Physican: Laurey Morale, MD Primary Cardiologist: Angelena Form Electrophysiologist: Vergie Living Pacing: 65.8%  (was 85.1 % on 11/14/2020 report) 02/14/2021 Office Weight: 275 lbs                                                            Attempted call to patient and unable to reach.  Left detailed message per DPR regarding transmission. Transmission reviewed.    Optivol thoracic impedance trending slightly below baseline normal.   Prescribed: Furosemide 80 mg take 1 tablet every other day alternating with 0.5 tablet (40 mg total) every other day. Per [name] pharmacist, he may take extra 20-40 mg as needed for weight gain/swelling. KLC 20 mEq take 2 tablets every other day alternating with 1 tablet every other day. Spironolactone 25 mg take 1 tablet daily   Labs: 11/14/2020 Creatinine 1.05, BUN 25, Potassium 4.6, Sodium 137, GFR 65.23 A complete set of results can be found in Results Review.   Recommendations:  Left voice mail with ICM number and encouraged to call if experiencing any fluid symptoms.     Follow-up plan: ICM clinic phone appointment on 04/10/2021.   91 day device clinic remote transmission 04/09/2021.     EP/Cardiology Office Visits: Recall 01/08/2021 with Dr Angelena Form.  Recall 07/12/2021 with Dr [name]   Copy of ICM check sent to Dr. [name].   3 month ICM trend: 03/09/2021.    1 Year ICM trend:       [name], RN 03/09/2021 4:04 PM

## 2021-03-09 NOTE — Telephone Encounter (Signed)
Remote ICM transmission received.  Attempted call to patient regarding ICM remote transmission and no answer.  Mail box is not set up.

## 2021-03-12 ENCOUNTER — Telehealth: Payer: Self-pay | Admitting: Cardiovascular Disease

## 2021-03-12 NOTE — Telephone Encounter (Signed)
Pt c/o swelling: STAT is pt has developed SOB within 24 hours  If swelling, where is the swelling located? Feet and ankles  How much weight have you gained and in what time span? Not sure. Patient does not weigh himself  Have you gained 3 pounds in a day or 5 pounds in a week? Not sure  Do you have a log of your daily weights (if so, list)? no  Are you currently taking a fluid pill? yes  Are you currently SOB? no  Have you traveled recently? No  Patient said he notices the swelling around his feet and ankles. He sees it when he takes his socks off. HE  only gets SOB when getting in and out of the shower. He just wanted to talk to Dr. Angelena Form about his symptoms

## 2021-03-12 NOTE — Telephone Encounter (Signed)
Feet are still swollen.  Feels bloated in his abdomen.  He has felt this increasing for some time.  Shoes and clothes are tighter.  281 pounds currently. Feels this is about 20 pounds more than normal.  For last 7 days has been taking lasix 80 mg daily instead of alternating w half tab every other day.  Taking 2 potassium tablets with this.   SOB with activities such as when he gets out of the shower.   He is going to weigh daily first thing in the am.  No big diet changes recently.  He is aware I will forward to [facility] nurse for review and recommendations.   Reports he feels like he is moving back in the direction of needing to go into the hospital for diuresis.  Will work to reduce sodium.

## 2021-03-13 NOTE — Telephone Encounter (Signed)
Spoke with patient.  Requested to send updated remote transmission today for review of fluid levels.  He admits to eating foods for the past 2 months that were higher in salt but does try to limit as much as he can in his diet.    He is reporting following symptoms: SOB with activities Weight gain of 15 lbs but unsure of time frame regarding weight gain.  Weight in April 264 lbs.  He does not usually weigh at home but today's weight is 280.6 lbs Swelling of feet (shoes are tighter) Abdominal swelling/bloating (clothes are tighter)   What he has tried: Adjusted Furosemide to 80 mg daily x 7 days instead of prescribed dosage of 80 mg alternating with 40 mg every other day. Urine output is good with 80 mg daily but not resolving symptoms.  Taking 2 Potassium tablets with every dose of Furosemide 80 mg. Decreased dietary salt intake  03/13/2021 Optivol thoracic impedance suggesting possible fluid accumulation starting 7/4 but returned to normal on 03/12/2021.   Pt reporting ongoing possible fluid symptoms which does not correlate with report suggesting fluid levels have returned to normal.       Prescribed: Furosemide 80 mg take 1 tablet every other day alternating with 0.5 tablet (40 mg total) every other day. Per [name] pharmacist, he may take extra 20-40 mg as needed for weight gain/swelling. KLC 20 mEq take 2 tablets every other day alternating with 1 tablet every other day. Spironolactone 25 mg take 1 tablet daily   Labs: 11/14/2020 Creatinine 1.05, BUN 25, Potassium 4.6, Sodium 137, GFR 65.23 A complete set of results can be found in Results Review.  Fluid symptoms have not resolved  He reports 10-15 years ago he had so much fluid that he required hospitalization for diuresing and concerned he may get to that point again if he cannot get fluid symptoms resolved.   Dr Angelena Form is not currently in the office.  Will send to Dr [name] for review and recommendations.

## 2021-03-14 MED ORDER — METOLAZONE 2.5 MG PO TABS: ORAL_TABLET | ORAL | 0 refills | Status: DC

## 2021-03-14 NOTE — Telephone Encounter (Signed)
Spoke with patient and advised of Dr Olin Pia recommendations and options.  After discussing with patient will proceed with taking Metolazone 2.5 mg 1 time dose.  He feels like the fluid may come off with the stronger diuretic.  He is concerned a hospitalization may be needed the longer it takes for fluid to resolve.  Will advise Dr [name] patient will take 1 time dose of Metolazone at this time. Pt's preferred pharmacy is [facility] and script sent.   Advised patient to take 1 time Metolazone 2.5 mg 30 minutes before taking Furosemide to achieve maximum effect.  He will take the recommended dosage after he picks up from pharmacy this AM.  Advised if condition worsens to use ER if needed.  Will follow up with patient on 7/22 to check effectiveness of Metolazone.

## 2021-03-14 NOTE — Telephone Encounter (Signed)
Received: Justin [name], Justin Standard, MD  Dorna Mallet Panda, RN Caller: Unspecified (2 days ago,  5:02 PM) [name]  We can do 1 of 2 things.  We can switch him from furosemide to torsemide treated with 80 mg alternating with 40 mg every other day for 1 week and then let him go on 40 mg daily in hopes that the torsemide promotes a brisker diuresis.  Alternatively we could try him on his furosemide dose with 2.5 mg of metolazone.  I suspect that with his 15 pound weight gain that he will need more than 1 dose of metolazone and the torsemide may be the better option letme know

## 2021-03-15 ENCOUNTER — Other Ambulatory Visit: Payer: Self-pay | Admitting: Family Medicine

## 2021-03-19 ENCOUNTER — Telehealth: Payer: Self-pay | Admitting: Family Medicine

## 2021-03-19 ENCOUNTER — Telehealth: Payer: Self-pay

## 2021-03-19 NOTE — Telephone Encounter (Signed)
Received call from patient.  He reports the following after taking Metolazone dosage as recommended by Dr [name] (see 03/12/2021 phone note).  Pt was advised to only take 1 Metolazone tablet and instructed not to take a 2nd dose unless directed the office.  Pt took a 2nd Metolazone tablet without instructing to do so.    He is reporting following symptoms after taking 2 doses of Metolazone: SOB continue with activities Weight decreased from 281 lbs to 273 lbs as of today.  Baseline 264 lbs.  Weight gain has been over a period of 6 weeks   What he has tried: Adjusted Furosemide to 80 mg daily x 7 prior to Metolazone dosages Metolazone 2.5 mg on 7/21 and 7/23 with extra potassium Decreasing salt intake.   Pt thinks he still has about 8 lbs of fluid weight.   Advised patient will send copy to Dr Angelena Form since he is in the office for review and further recommendations.  Advised patient he may need an office appointment but will wait for recommendation from Dr Angelena Form.  03/19/2021 Optivol Thoracic impedance is suggesting dryness after taking 2 Metolazone dosages.  Symptoms are not correlating with Optivol.

## 2021-03-19 NOTE — Telephone Encounter (Signed)
Patient mailed in forms for [employer]  Fax to: [phone]  Disposition: Dr's Folder

## 2021-03-20 MED ORDER — METOLAZONE 2.5 MG PO TABS: ORAL_TABLET | ORAL | 0 refills | Status: DC

## 2021-03-20 NOTE — Telephone Encounter (Signed)
Pt aware of recommendations. He will contact the office back if he is not back to his base weight by the end of next week. A refill for metolazone was sent in to his preferred pharmacy.

## 2021-03-21 NOTE — Telephone Encounter (Signed)
The form is ready  

## 2021-03-21 NOTE — Telephone Encounter (Signed)
FYI,  forms in folder

## 2021-03-22 ENCOUNTER — Ambulatory Visit (INDEPENDENT_AMBULATORY_CARE_PROVIDER_SITE_OTHER): Payer: Medicare Other

## 2021-03-22 ENCOUNTER — Ambulatory Visit: Payer: Medicare Other

## 2021-03-22 ENCOUNTER — Other Ambulatory Visit: Payer: Self-pay

## 2021-03-22 VITALS — BP 125/65 | HR 65 | Temp 97.7°F | Ht 66.0 in | Wt 277.0 lb

## 2021-03-22 DIAGNOSIS — Z0000 Encounter for general adult medical examination without abnormal findings: Secondary | ICD-10-CM | POA: Diagnosis not present

## 2021-03-22 NOTE — Patient Instructions (Signed)
Justin [name] , Thank you for taking time to come for your Medicare Wellness Visit. I appreciate your ongoing commitment to your health goals. Please review the following plan we discussed and let me know if I can assist you in the future.   Screening recommendations/referrals: Colonoscopy: no longer required  Recommended yearly ophthalmology/optometry visit for glaucoma screening and checkup Recommended yearly dental visit for hygiene and checkup  Vaccinations: Influenza vaccine: due in Fall 2022 Pneumococcal vaccine: completed series  Tdap vaccine: 10/27/2020 Shingles vaccine: completed series     Advanced directives: will provide copies   Conditions/risks identified: none   Next appointment: none   Preventive Care 27 Years and Older, Male Preventive care refers to lifestyle choices and visits with your health care provider that can promote health and wellness. What does preventive care include? A yearly physical exam. This is also called an annual well check. Dental exams once or twice a year. Routine eye exams. Ask your health care provider how often you should have your eyes checked. Personal lifestyle choices, including: Daily care of your teeth and gums. Regular physical activity. Eating a healthy diet. Avoiding tobacco and drug use. Limiting alcohol use. Practicing safe sex. Taking low doses of aspirin every day. Taking vitamin and mineral supplements as recommended by your health care provider. What happens during an annual well check? The services and screenings done by your health care provider during your annual well check will depend on your age, overall health, lifestyle risk factors, and family history of disease. Counseling  Your health care provider may ask you questions about your: Alcohol use. Tobacco use. Drug use. Emotional well-being. Home and relationship well-being. Sexual activity. Eating habits. History of falls. Memory and ability to understand  (cognition). Work and work Statistician. Screening  You may have the following tests or measurements: Height, weight, and BMI. Blood pressure. Lipid and cholesterol levels. These may be checked every 5 years, or more frequently if you are over 39 years old. Skin check. Lung cancer screening. You may have this screening every year starting at age 60 if you have a 30-pack-year history of smoking and currently smoke or have quit within the past 15 years. Fecal occult blood test (FOBT) of the stool. You may have this test every year starting at age 59. Flexible sigmoidoscopy or colonoscopy. You may have a sigmoidoscopy every 5 years or a colonoscopy every 10 years starting at age 33. Prostate cancer screening. Recommendations will vary depending on your family history and other risks. Hepatitis C blood test. Hepatitis B blood test. Sexually transmitted disease (STD) testing. Diabetes screening. This is done by checking your blood sugar (glucose) after you have not eaten for a while (fasting). You may have this done every 1-3 years. Abdominal aortic aneurysm (AAA) screening. You may need this if you are a current or former smoker. Osteoporosis. You may be screened starting at age 86 if you are at high risk. Talk with your health care provider about your test results, treatment options, and if necessary, the need for more tests. Vaccines  Your health care provider may recommend certain vaccines, such as: Influenza vaccine. This is recommended every year. Tetanus, diphtheria, and acellular pertussis (Tdap, Td) vaccine. You may need a Td booster every 10 years. Zoster vaccine. You may need this after age 33. Pneumococcal 13-valent conjugate (PCV13) vaccine. One dose is recommended after age 87. Pneumococcal polysaccharide (PPSV23) vaccine. One dose is recommended after age 85. Talk to your health care provider about which screenings  and vaccines you need and how often you need them. This  information is not intended to replace advice given to you by your health care provider. Make sure you discuss any questions you have with your health care provider. Document Released: 09/08/2015 Document Revised: 05/01/2016 Document Reviewed: 06/13/2015 Elsevier Interactive Patient Education  2017 [place] Prevention in the Home Falls can cause injuries. They can happen to people of all ages. There are many things you can do to make your home safe and to help prevent falls. What can I do on the outside of my home? Regularly fix the edges of walkways and driveways and fix any cracks. Remove anything that might make you trip as you walk through a door, such as a raised step or threshold. Trim any bushes or trees on the path to your home. Use bright outdoor lighting. Clear any walking paths of anything that might make someone trip, such as rocks or tools. Regularly check to see if handrails are loose or broken. Make sure that both sides of any steps have handrails. Any raised decks and porches should have guardrails on the edges. Have any leaves, snow, or ice cleared regularly. Use sand or salt on walking paths during winter. Clean up any spills in your garage right away. This includes oil or grease spills. What can I do in the bathroom? Use night lights. Install grab bars by the toilet and in the tub and shower. Do not use towel bars as grab bars. Use non-skid mats or decals in the tub or shower. If you need to sit down in the shower, use a plastic, non-slip stool. Keep the floor dry. Clean up any water that spills on the floor as soon as it happens. Remove soap buildup in the tub or shower regularly. Attach bath mats securely with double-sided non-slip rug tape. Do not have throw rugs and other things on the floor that can make you trip. What can I do in the bedroom? Use night lights. Make sure that you have a light by your bed that is easy to reach. Do not use any sheets or  blankets that are too big for your bed. They should not hang down onto the floor. Have a firm chair that has side arms. You can use this for support while you get dressed. Do not have throw rugs and other things on the floor that can make you trip. What can I do in the kitchen? Clean up any spills right away. Avoid walking on wet floors. Keep items that you use a lot in easy-to-reach places. If you need to reach something above you, use a strong step stool that has a grab bar. Keep electrical cords out of the way. Do not use floor polish or wax that makes floors slippery. If you must use wax, use non-skid floor wax. Do not have throw rugs and other things on the floor that can make you trip. What can I do with my stairs? Do not leave any items on the stairs. Make sure that there are handrails on both sides of the stairs and use them. Fix handrails that are broken or loose. Make sure that handrails are as long as the stairways. Check any carpeting to make sure that it is firmly attached to the stairs. Fix any carpet that is loose or worn. Avoid having throw rugs at the top or bottom of the stairs. If you do have throw rugs, attach them to the floor with carpet tape.  Make sure that you have a light switch at the top of the stairs and the bottom of the stairs. If you do not have them, ask someone to add them for you. What else can I do to help prevent falls? Wear shoes that: Do not have high heels. Have rubber bottoms. Are comfortable and fit you well. Are closed at the toe. Do not wear sandals. If you use a stepladder: Make sure that it is fully opened. Do not climb a closed stepladder. Make sure that both sides of the stepladder are locked into place. Ask someone to hold it for you, if possible. Clearly mark and make sure that you can see: Any grab bars or handrails. First and last steps. Where the edge of each step is. Use tools that help you move around (mobility aids) if they are  needed. These include: Canes. Walkers. Scooters. Crutches. Turn on the lights when you go into a dark area. Replace any light bulbs as soon as they burn out. Set up your furniture so you have a clear path. Avoid moving your furniture around. If any of your floors are uneven, fix them. If there are any pets around you, be aware of where they are. Review your medicines with your doctor. Some medicines can make you feel dizzy. This can increase your chance of falling. Ask your doctor what other things that you can do to help prevent falls. This information is not intended to replace advice given to you by your health care provider. Make sure you discuss any questions you have with your health care provider. Document Released: 06/08/2009 Document Revised: 01/18/2016 Document Reviewed: 09/16/2014 Elsevier Interactive Patient Education  2017 [employer].

## 2021-03-22 NOTE — Progress Notes (Signed)
Subjective:   Justin [name] is a 87 y.o. male who presents for an Initial Medicare Annual Wellness Visit.  Review of Systems    N/a       Objective:    There were no vitals filed for this visit. There is no height or weight on file to calculate BMI.  Advanced Directives 05/19/2019 05/12/2019 05/02/2017 03/04/2017 02/27/2017 02/27/2017 01/22/2017  Does Patient Have a Medical Advance Directive? Yes Yes Yes Yes Yes Yes Yes  Type of Paramedic of [place];Living will [place];Living will [place];Living will [place];Living will [place];Living will [place];Living will [place];Living will  Does patient want to make changes to medical advance directive? No - Patient declined No - Patient declined No - Patient declined No - Patient declined - - -  Copy of [place] in Chart? Yes - validated most recent copy scanned in chart (See row information) Yes - validated most recent copy scanned in chart (See row information) Yes - No - copy requested No - copy requested No - copy requested  Pre-existing out of facility DNR order (yellow form or pink MOST form) - - - - - - -    Current Medications (verified) Outpatient Encounter Medications as of 03/22/2021  Medication Sig   aspirin 81 MG tablet Take 1 tablet (81 mg total) by mouth daily.   atorvastatin (LIPITOR) 20 MG tablet TAKE 1 TABLET ONCE DAILY.   azelastine (ASTELIN) 0.1 % nasal spray Place 2 sprays into both nostrils 2 (two) times daily. Use in each nostril as directed   Azelastine HCl 0.15 % SOLN Place 2 sprays into the nose in the morning and at bedtime.   benzonatate (TESSALON) 200 MG capsule Take 1 capsule (200 mg total) by mouth 2 (two) times daily as needed for cough.   colchicine 0.6 MG tablet Take 1 tablet (0.6 mg total) by mouth daily.   dabigatran (PRADAXA) 150 MG CAPS capsule  TAKE (1) CAPSULE TWICE DAILY.   diphenhydrAMINE (BENADRYL) 25 MG tablet Take 25 mg by mouth at bedtime.    EPINEPHrine 0.3 mg/0.3 mL IJ SOAJ injection Inject 0.3 mLs (0.3 mg total) into the muscle as needed for anaphylaxis.   famotidine (PEPCID) 20 MG tablet Take 1 tablet (20 mg total) by mouth at bedtime.   fluorouracil (EFUDEX) 5 % cream Apply topically 2 (two) times daily.   furosemide (LASIX) 80 MG tablet Take one tablet daily alternating with a HALF tablet every other day   hydrOXYzine (ATARAX/VISTARIL) 10 MG tablet Take 10 mg by mouth at bedtime.    isosorbide mononitrate (IMDUR) 30 MG 24 hr tablet TAKE 1 TABLET ONCE DAILY.   Ketotifen Fumarate (ZADITOR OP) Apply 1 drop to eye daily as needed (itchy eyes).    levocetirizine (XYZAL) 5 MG tablet Take 5 mg by mouth daily.   levothyroxine (SYNTHROID) 100 MCG tablet Take 1 tablet (100 mcg total) by mouth daily.   meclizine (ANTIVERT) 25 MG tablet Take 1 tablet (25 mg total) by mouth every 4 (four) hours as needed for dizziness.   methocarbamol (ROBAXIN) 500 MG tablet Take 1 tablet (500 mg total) by mouth every 8 (eight) hours as needed for muscle spasms.   metoCLOPramide (REGLAN) 10 MG tablet Take 1 tablet every morning with breakfast   metolazone (ZAROXOLYN) 2.5 MG tablet Take 1 tab 2 times per week for up to 2 weeks or  until weight is back to baseline.   metoprolol succinate (TOPROL-XL) 50 MG 24 hr tablet Take 1.5 tablets (75 mg total) by mouth daily. Take with or immediately following a meal.   montelukast (SINGULAIR) 10 MG tablet TAKE 1 TABLET ONCE DAILY IN THE EVENING.   Multiple Vitamins-Minerals (PRESERVISION AREDS PO) Take 2 tablets by mouth every morning.   omeprazole (PRILOSEC) 20 MG capsule TAKE (1) CAPSULE TWICE DAILY.   oxyCODONE (OXY IR/ROXICODONE) 5 MG immediate release tablet Take 1 tablet (5 mg total) by mouth every 12 (twelve) hours as needed for severe pain.   potassium bicarbonate (K-LYTE) 25 MEQ disintegrating tablet Take 25  mEq by mouth 2 (two) times daily.   potassium chloride SA (KLOR-CON) 20 MEQ tablet Take 2 tablets daily alternating with 1 tablet every other day   sacubitril-valsartan (ENTRESTO) 97-103 MG Take 1 tablet by mouth 2 (two) times daily.   spironolactone (ALDACTONE) 25 MG tablet TAKE 1 TABLET ONCE DAILY.   triamcinolone cream (KENALOG) 0.1 % Apply 1 application topically daily as needed for dry skin.   valACYclovir (VALTREX) 500 MG tablet Take 500 mg by mouth 2 (two) times daily as needed (cold sores).   No facility-administered encounter medications on file as of 03/22/2021.    Allergies (verified) Peanut-containing drug products, Sulfonamide derivatives, Amlodipine, Eliquis [apixaban], Lisinopril, and Xarelto [rivaroxaban]   History: Past Medical History:  Diagnosis Date   Age-related macular degeneration, dry, both eyes    Allergic    "24/7; 365 days/year; I'm allergic to pollens, dust, all southern grasses/trees, mold, mildue, cats, dogs" (01/07/2017)   Anal fissure    Asthma    sees [name]    Benign prostatic hypertrophy    (sees Dr. Denman [place]   CAD (coronary artery disease)    a. s/p CABG 2007. b. Cath 08/2016 - 4/4 patent grafts.   Carotid bruit    carotid u/s 10/10: 0.39% bilaterally   Chronic combined systolic and diastolic CHF (congestive heart failure) (HCC)    Complication of anesthesia 1980s   "w/anal cyst OR, he gave me a saddle block then put a narcotic in spinal cord; had a severe reaction to that" (01/07/2017)   Congestive heart failure (CHF) [facility])    ED (erectile dysfunction)    Family history of adverse reaction to anesthesia    "daughter wakes up during OR" (01/07/2017)   GERD (gastroesophageal reflux disease)    Gout    HTN (hypertension)    Hx of colonic polyps    (sees Dr. Henrene Pastor)   Hyperlipidemia    Hypothyroidism    Moderate to severe aortic stenosis    a. s/p TAVR 02/2017.   Myocardial infarction [facility]) ~ 2000   Obesity    Osteoarthritis    "was in my  knees, hands" (01/07/2017 )   PAF (paroxysmal atrial fibrillation) ([place])    a. documented post TAVR.   Precancerous skin lesion    (sees Dr. 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   29 mm Edwards Sapien 3 transcatheter heart valve placed via percutaneous right transfemoral approach   Past Surgical History:  Procedure Laterality Date   ANUS SURGERY     "opened it back up cause it wouldn't heal; wound up w/a fissure" (01/07/2017)   BIV ICD INSERTION CRT-D N/A 05/02/2017   Procedure: BIV ICD INSERTION CRT-D;  Surgeon: Deboraha Sprang, MD;  Location: [place]  CV LAB;  Service: Cardiovascular;  Laterality: N/A;   CARDIAC CATHETERIZATION  10/29/2005   CARDIAC CATHETERIZATION N/A 08/27/2016   Procedure: Right/Left Heart Cath and Coronary/Graft Angiography;  Surgeon: Burnell Blanks, MD;  Location: [place] CV LAB;  Service: Cardiovascular;  Laterality: N/A;   CATARACT EXTRACTION W/ INTRAOCULAR LENS  IMPLANT, BILATERAL Bilateral    CATARACT EXTRACTION, BILATERAL  2012   COLONOSCOPY  06/30/2008   no repeats needed    COLONOSCOPY     had 3 or 4 in the past    CORONARY ARTERY BYPASS GRAFT  2007   "CABG X4"   CYST EXCISION PERINEAL  1980s   HAMMER TOE SURGERY Bilateral    JOINT REPLACEMENT     KNEE ARTHROPLASTY  07/30/2011   Procedure: COMPUTER ASSISTED TOTAL KNEE ARTHROPLASTY;  Surgeon: Meredith Pel;  Location: [place];  Service: Orthopedics;  Laterality: Left;  left total knee arthroplasty   MASTECTOMY SUBCUTANEOUS Bilateral    MULTIPLE TOOTH EXTRACTIONS     ORIF FINGER / THUMB FRACTURE Right ~ 1980   "repair of thumb injury"   PROSTATE BIOPSY     REPLACEMENT TOTAL KNEE BILATERAL Bilateral 2012   TEE WITHOUT CARDIOVERSION N/A 03/04/2017   Procedure: TRANSESOPHAGEAL ECHOCARDIOGRAM (TEE);  Surgeon: Burnell Blanks, MD;  Location: [place];  Service: Open Heart Surgery;  Laterality: N/A;    TOTAL KNEE REVISION Right 05/18/2019   Procedure: RIGHT PATELLA REVISION/REMOVAL;  Surgeon: Meredith Pel, MD;  Location: [place];  Service: Orthopedics;  Laterality: Right;   TRANSCATHETER AORTIC VALVE REPLACEMENT, TRANSFEMORAL N/A 03/04/2017   Procedure: TRANSCATHETER AORTIC VALVE REPLACEMENT, TRANSFEMORAL;  Surgeon: Burnell Blanks, MD;  Location: [place];  Service: Open Heart Surgery;  Laterality: N/A;   Family History  Problem Relation Age of Onset   Heart attack Father 84   Allergic rhinitis Father    Asthma Father    Heart failure Mother 4   Uterine cancer Mother    Breast cancer Mother    Colon cancer Neg Hx    Esophageal cancer Neg Hx    Social History   Socioeconomic History   Marital status: Married    Spouse name: Not on file   Number of children: 3   Years of education: Not on file   Highest education level: Not on file  Occupational History   Occupation: Clinical biochemist    Comment: builds malls  Tobacco Use   Smoking status: Former    Packs/day: 3.50    Years: 13.00    Pack years: 45.50    Types: Cigarettes    Quit date: 1963    Years since quitting: 59.6   Smokeless tobacco: Never  Vaping Use   Vaping Use: Never used  Substance and Sexual Activity   Alcohol use: Yes    Alcohol/week: 0.0 standard drinks    Comment: 01/07/2017 "might have 2 beers 2-3 times/month"   Drug use: No   Sexual activity: Never  Other Topics Concern   Not on file  Social History Narrative   FH of CAD, Male 1st degree relative less than age 43.   Social Determinants of Health   Financial Resource Strain: Low Risk    Difficulty of Paying Living Expenses: Not very hard  Food Insecurity: Not on file  Transportation Needs: Not on file  Physical Activity: Not on file  Stress: Not on file  Social Connections: Not on file    Tobacco Counseling Counseling given: Not Answered   Clinical Intake:  Diabetic?no         Activities of  Daily Living No flowsheet data found.  Patient Care Team: Laurey Morale, MD as PCP - General Angelena Form [name], MD as PCP - Cardiology (Cardiology) Festus Aloe, MD as Consulting Physician (Urology) Viona Gilmore, [facility] as Pharmacist (Pharmacist)  Indicate any recent Medical Services you may have received from other than [facility] providers in the past year (date may be approximate).     Assessment:   This is a routine wellness examination for Justin [name].  Hearing/Vision screen No results found.  Dietary issues and exercise activities discussed:     Goals Addressed   None    Depression Screen PHQ 2/9 Scores 03/11/2019 06/02/2017 01/17/2017 12/20/2014 11/12/2013  PHQ - 2 Score 0 0 0 0 0    Fall Risk Fall Risk  05/29/2017 01/17/2017 12/20/2014 11/12/2013  Falls in the past year? No No No Yes  Number falls in past yr: - - - 2 or more  Risk for fall due to : Impaired balance/gait - - -    FALL RISK PREVENTION PERTAINING TO THE HOME:  Any stairs in or around the home? No  If so, are there any without handrails? No  Home free of loose throw rugs in walkways, pet beds, electrical cords, etc? Yes  Adequate lighting in your home to reduce risk of falls? Yes   ASSISTIVE DEVICES UTILIZED TO PREVENT FALLS:  Life alert? No  Use of a cane, walker or w/c? No  Grab bars in the bathroom? Yes  Shower chair or bench in shower? No  Elevated toilet seat or a handicapped toilet? Yes   TIMED UP AND GO:  Was the test performed? Yes .  Length of time to ambulate 10 feet: 12 sec.   Gait steady and fast without use of assistive device  Cognitive Function:  Normal cognitive status assessed by direct observation by this Nurse Health Advisor. No abnormalities found.        Immunizations Immunization History  Administered Date(s) Administered   Fluad Quad(high Dose 65+) 05/06/2019, 05/30/2020   Hep A / Hep B 04/06/2012, 10/07/2012   Hepatitis B 05/07/2012   Influenza Split  06/28/2013   Influenza Whole 06/05/2007, 05/13/2008, 06/06/2009, 05/30/2010   Influenza, High Dose Seasonal PF 05/15/2018   Influenza,inj,Quad PF,6+ Mos 05/20/2016   Influenza-Unspecified 05/25/2014, 07/08/2015, 05/06/2017   PFIZER(Purple Top)SARS-COV-2 Vaccination 09/28/2019, 10/12/2019, 05/30/2020   Pneumococcal Conjugate-13 09/18/2015   Pneumococcal Polysaccharide-23 05/13/2008, 05/25/2014   Td 05/30/2010   Tdap 10/27/2020   Zoster Recombinat (Shingrix) 05/18/2018, 10/16/2018   Zoster, Live 10/21/2007    TDAP status: Up to date  Flu Vaccine status: Up to date  Pneumococcal vaccine status: Up to date  Covid-19 vaccine status: Completed vaccines  Qualifies for Shingles Vaccine? Yes   Zostavax completed No   Shingrix Completed?: No.    Education has been provided regarding the importance of this vaccine. Patient has been advised to call insurance company to determine out of pocket expense if they have not yet received this vaccine. Advised may also receive vaccine at local pharmacy or Health Dept. Verbalized acceptance and understanding.  Screening Tests Health Maintenance  Topic Date Due   COVID-19 Vaccine (4 - Booster) 08/30/2020   INFLUENZA VACCINE  03/26/2021   TETANUS/TDAP  10/28/2030   PNA vac Low Risk Adult  Completed   Zoster Vaccines- Shingrix  Completed   HPV VACCINES  Aged Out    Health Maintenance  Health Maintenance Due  Topic  Date Due   COVID-19 Vaccine (4 - Booster) 08/30/2020    Colorectal cancer screening: No longer required.   Lung Cancer Screening: (Low Dose CT Chest recommended if Age 56-80 years, 30 pack-year currently smoking OR have quit w/in 15years.) does not qualify.   Lung Cancer Screening Referral: n/a  Additional Screening:  Hepatitis C Screening: does not qualify;   Vision Screening: Recommended annual ophthalmology exams for early detection of glaucoma and other disorders of the eye. Is the patient up to date with their annual eye  exam?  Yes  Who is the provider or what is the name of the office in which the patient attends annual eye exams? Dr.McQuen  If pt is not established with a provider, would they like to be referred to a provider to establish care? No .   Dental Screening: Recommended annual dental exams for proper oral hygiene  Community Resource Referral / Chronic Care Management: CRR required this visit?  No   CCM required this visit?  No      Plan:     I have personally reviewed and noted the following in the patient's chart:   Medical and social history Use of alcohol, tobacco or illicit drugs  Current medications and supplements including opioid prescriptions. Patient is currently taking opioid prescriptions. Information provided to patient regarding non-opioid alternatives. Patient advised to discuss non-opioid treatment plan with their provider. Functional ability and status Nutritional status Physical activity Advanced directives List of other physicians Hospitalizations, surgeries, and ER visits in previous 12 months Vitals Screenings to include cognitive, depression, and falls Referrals and appointments  In addition, I have reviewed and discussed with patient certain preventive protocols, quality metrics, and best practice recommendations. A written personalized care plan for preventive services as well as general preventive health recommendations were provided to patient.     Randel Pigg, LPN   [id number]   Nurse Notes: none

## 2021-03-27 ENCOUNTER — Encounter: Payer: Self-pay | Admitting: Family Medicine

## 2021-03-27 ENCOUNTER — Ambulatory Visit (INDEPENDENT_AMBULATORY_CARE_PROVIDER_SITE_OTHER): Payer: Medicare Other | Admitting: Family Medicine

## 2021-03-27 ENCOUNTER — Other Ambulatory Visit: Payer: Self-pay

## 2021-03-27 VITALS — BP 96/60 | HR 67 | Temp 97.5°F | Ht 66.0 in | Wt 275.0 lb

## 2021-03-27 DIAGNOSIS — C61 Malignant neoplasm of prostate: Secondary | ICD-10-CM

## 2021-03-27 DIAGNOSIS — K219 Gastro-esophageal reflux disease without esophagitis: Secondary | ICD-10-CM

## 2021-03-27 DIAGNOSIS — M109 Gout, unspecified: Secondary | ICD-10-CM | POA: Diagnosis not present

## 2021-03-27 DIAGNOSIS — N401 Enlarged prostate with lower urinary tract symptoms: Secondary | ICD-10-CM | POA: Diagnosis not present

## 2021-03-27 DIAGNOSIS — I5020 Unspecified systolic (congestive) heart failure: Secondary | ICD-10-CM | POA: Diagnosis not present

## 2021-03-27 DIAGNOSIS — R739 Hyperglycemia, unspecified: Secondary | ICD-10-CM | POA: Diagnosis not present

## 2021-03-27 DIAGNOSIS — I2510 Atherosclerotic heart disease of native coronary artery without angina pectoris: Secondary | ICD-10-CM

## 2021-03-27 DIAGNOSIS — R059 Cough, unspecified: Secondary | ICD-10-CM | POA: Diagnosis not present

## 2021-03-27 DIAGNOSIS — M8949 Other hypertrophic osteoarthropathy, multiple sites: Secondary | ICD-10-CM

## 2021-03-27 DIAGNOSIS — J4520 Mild intermittent asthma, uncomplicated: Secondary | ICD-10-CM | POA: Diagnosis not present

## 2021-03-27 DIAGNOSIS — M150 Primary generalized (osteo)arthritis: Secondary | ICD-10-CM

## 2021-03-27 DIAGNOSIS — E039 Hypothyroidism, unspecified: Secondary | ICD-10-CM | POA: Diagnosis not present

## 2021-03-27 DIAGNOSIS — I10 Essential (primary) hypertension: Secondary | ICD-10-CM | POA: Diagnosis not present

## 2021-03-27 DIAGNOSIS — N138 Other obstructive and reflux uropathy: Secondary | ICD-10-CM

## 2021-03-27 DIAGNOSIS — M159 Polyosteoarthritis, unspecified: Secondary | ICD-10-CM

## 2021-03-27 NOTE — Progress Notes (Signed)
Subjective:    Patient ID: Justin [name], male    DOB: [birth year], 87 y.o.   MRN: [id number]  HPI Here to follow up on issues. He has been doing well in general, though he recently had some trouble with fluid retention. His weight went up from 273 to 281 even though he was taking 80 mg of Lasix a day. He felt his abdomen swell and he became mildly SOB. He talked to Cardiology and they have added Zaroxolyn twice a week. The weight has come down and he feels better. He has no real complaints today. His BP is stable. The gout is stable. The arthritis is stable.    Review of Systems  Constitutional: Negative.   HENT: Negative.    Eyes: Negative.   Respiratory:  Positive for cough and shortness of breath.   Cardiovascular:  Positive for leg swelling.  Gastrointestinal: Negative.   Genitourinary: Negative.   Musculoskeletal:  Positive for arthralgias.  Skin: Negative.   Neurological: Negative.   Psychiatric/Behavioral: Negative.        Objective:   Physical Exam Constitutional:      General: He is not in acute distress.    Appearance: Normal appearance. He is well-developed. He is obese. He is not diaphoretic.  HENT:     Head: Normocephalic and atraumatic.     Right Ear: External ear normal.     Left Ear: External ear normal.     Nose: Nose normal.     Mouth/Throat:     Pharynx: No oropharyngeal exudate.  Eyes:     General: No scleral icterus.       Right eye: No discharge.        Left eye: No discharge.     Conjunctiva/sclera: Conjunctivae normal.     Pupils: Pupils are equal, round, and reactive to light.  Neck:     Thyroid: No thyromegaly.     Vascular: No JVD.     Trachea: No tracheal deviation.  Cardiovascular:     Rate and Rhythm: Normal rate and regular rhythm.     Heart sounds: Normal heart sounds. No murmur heard.   No friction rub. No gallop.  Pulmonary:     Effort: Pulmonary effort is normal. No respiratory distress.     Breath sounds: Normal breath  sounds. No wheezing or rales.  Chest:     Chest wall: No tenderness.  Abdominal:     General: Bowel sounds are normal. There is no distension.     Palpations: Abdomen is soft. There is no mass.     Tenderness: There is no abdominal tenderness. There is no guarding or rebound.  Genitourinary:    Penis: Normal. No tenderness.      Testes: Normal.     Prostate: Normal.     Rectum: Normal. Guaiac result negative.  Musculoskeletal:        General: No tenderness. Normal range of motion.     Cervical back: Neck supple.     Comments: Trace edema in both ankles   Lymphadenopathy:     Cervical: No cervical adenopathy.  Skin:    General: Skin is warm and dry.     Coloration: Skin is not pale.     Findings: No erythema or rash.  Neurological:     Mental Status: He is alert and oriented to person, place, and time.     Cranial Nerves: No cranial nerve deficit.     Motor: No abnormal muscle tone.  Coordination: Coordination normal.     Deep Tendon Reflexes: Reflexes are normal and symmetric. Reflexes normal.  Psychiatric:        Behavior: Behavior normal.        Thought Content: Thought content normal.        Judgment: Judgment normal.          Assessment & Plan:  His CHF and CAD and HTN are stable. He will follow up with Dr. Angelena Form. Check fasting labs tomorrow for lipids, a full thyroid panel, BMET, BNP, etc. His OA and GERD are stable. We spent 35 minutes reviewing records and discussing these issues.  [name], MD

## 2021-03-28 ENCOUNTER — Other Ambulatory Visit (INDEPENDENT_AMBULATORY_CARE_PROVIDER_SITE_OTHER): Payer: Medicare Other

## 2021-03-28 DIAGNOSIS — M109 Gout, unspecified: Secondary | ICD-10-CM | POA: Diagnosis not present

## 2021-03-28 DIAGNOSIS — N401 Enlarged prostate with lower urinary tract symptoms: Secondary | ICD-10-CM | POA: Diagnosis not present

## 2021-03-28 DIAGNOSIS — N138 Other obstructive and reflux uropathy: Secondary | ICD-10-CM

## 2021-03-28 DIAGNOSIS — I10 Essential (primary) hypertension: Secondary | ICD-10-CM

## 2021-03-28 DIAGNOSIS — I5020 Unspecified systolic (congestive) heart failure: Secondary | ICD-10-CM

## 2021-03-28 DIAGNOSIS — E039 Hypothyroidism, unspecified: Secondary | ICD-10-CM | POA: Diagnosis not present

## 2021-03-28 DIAGNOSIS — R739 Hyperglycemia, unspecified: Secondary | ICD-10-CM | POA: Diagnosis not present

## 2021-03-28 LAB — BASIC METABOLIC PANEL
BUN: 31 mg/dL — ABNORMAL HIGH (ref 6–23)
CO2: 25 mEq/L (ref 19–32)
Calcium: 8.5 mg/dL (ref 8.4–10.5)
Chloride: 101 mEq/L (ref 96–112)
Creatinine, Ser: 1.24 mg/dL (ref 0.40–1.50)
GFR: 53.29 mL/min — ABNORMAL LOW (ref >60.00)
Glucose, Bld: 93 mg/dL (ref 70–99)
Potassium: 4.7 mEq/L (ref 3.5–5.1)
Sodium: 135 mEq/L (ref 135–145)

## 2021-03-28 LAB — HEPATIC FUNCTION PANEL
ALT: 16 U/L (ref 0–53)
AST: 17 U/L (ref 0–37)
Albumin: 3.5 g/dL (ref 3.5–5.2)
Alkaline Phosphatase: 82 U/L (ref 39–117)
Bilirubin, Direct: 0.2 mg/dL (ref 0.0–0.3)
Total Bilirubin: 0.8 mg/dL (ref 0.2–1.2)
Total Protein: 6.8 g/dL (ref 6.0–8.3)

## 2021-03-28 LAB — CBC WITH DIFFERENTIAL/PLATELET
Basophils Absolute: 0.1 10*3/uL (ref 0.0–0.1)
Basophils Relative: 0.7 % (ref 0.0–3.0)
Eosinophils Absolute: 0.3 10*3/uL (ref 0.0–0.7)
Eosinophils Relative: 3.2 % (ref 0.0–5.0)
HCT: 42.8 % (ref 39.0–52.0)
Hemoglobin: 13.8 g/dL (ref 13.0–17.0)
Lymphocytes Relative: 19.9 % (ref 12.0–46.0)
Lymphs Abs: 2 10*3/uL (ref 0.7–4.0)
MCHC: 32.3 g/dL (ref 30.0–36.0)
MCV: 86.6 fl (ref 78.0–100.0)
Monocytes Absolute: 1.2 10*3/uL — ABNORMAL HIGH (ref 0.1–1.0)
Monocytes Relative: 11.4 % (ref 3.0–12.0)
Neutro Abs: 6.6 10*3/uL (ref 1.4–7.7)
Neutrophils Relative %: 64.8 % (ref 43.0–77.0)
Platelets: 220 10*3/uL (ref 150.0–400.0)
RBC: 4.94 Mil/uL (ref 4.22–5.81)
RDW: 16.2 % — ABNORMAL HIGH (ref 11.5–15.5)
WBC: 10.1 10*3/uL (ref 4.0–10.5)

## 2021-03-28 LAB — BRAIN NATRIURETIC PEPTIDE: Pro B Natriuretic peptide (BNP): 354 pg/mL — ABNORMAL HIGH (ref 0.0–100.0)

## 2021-03-28 LAB — LIPID PANEL
Cholesterol: 124 mg/dL (ref 0–200)
HDL: 34.6 mg/dL — ABNORMAL LOW (ref >39.00)
LDL Cholesterol: 57 mg/dL (ref 0–99)
NonHDL: 89.32
Total CHOL/HDL Ratio: 4
Triglycerides: 161 mg/dL — ABNORMAL HIGH (ref 0.0–149.0)
VLDL: 32.2 mg/dL (ref 0.0–40.0)

## 2021-03-28 LAB — T4, FREE: Free T4: 0.82 ng/dL (ref 0.60–1.60)

## 2021-03-28 LAB — TSH: TSH: 5.39 u[IU]/mL (ref 0.35–5.50)

## 2021-03-28 LAB — T3, FREE: T3, Free: 3 pg/mL (ref 2.3–4.2)

## 2021-03-28 LAB — URIC ACID: Uric Acid, Serum: 8.5 mg/dL — ABNORMAL HIGH (ref 4.0–7.8)

## 2021-03-28 LAB — PSA: PSA: 3.68 ng/mL (ref 0.10–4.00)

## 2021-03-28 LAB — HEMOGLOBIN A1C: Hgb A1c MFr Bld: 6.6 % — ABNORMAL HIGH (ref 4.6–6.5)

## 2021-04-04 DIAGNOSIS — L570 Actinic keratosis: Secondary | ICD-10-CM | POA: Diagnosis not present

## 2021-04-04 DIAGNOSIS — C44622 Squamous cell carcinoma of skin of right upper limb, including shoulder: Secondary | ICD-10-CM | POA: Diagnosis not present

## 2021-04-04 DIAGNOSIS — L820 Inflamed seborrheic keratosis: Secondary | ICD-10-CM | POA: Diagnosis not present

## 2021-04-04 DIAGNOSIS — D485 Neoplasm of uncertain behavior of skin: Secondary | ICD-10-CM | POA: Diagnosis not present

## 2021-04-09 ENCOUNTER — Telehealth: Payer: Self-pay | Admitting: Pharmacist

## 2021-04-09 ENCOUNTER — Ambulatory Visit (INDEPENDENT_AMBULATORY_CARE_PROVIDER_SITE_OTHER): Payer: Medicare Other

## 2021-04-09 DIAGNOSIS — I5042 Chronic combined systolic (congestive) and diastolic (congestive) heart failure: Secondary | ICD-10-CM | POA: Diagnosis not present

## 2021-04-09 NOTE — Chronic Care Management (AMB) (Signed)
Chronic Care Management Pharmacy Assistant   Name: Justin [name]  MRN: [id number] DOB: 04-21-1934  Reason for Encounter: Disease State/ Hypertension Assessment Call.    Conditions to be addressed/monitored: HTN   Recent office visits:  03/27/21 Justin Penna MD (PCP) - seen for systolic heart failure and other chronic conditions. No medication changes. Follow up with Dr. Angelena [name].  Recent consult visits:  None.  Hospital visits:  None in previous 6 months  Medications: Outpatient Encounter Medications as of 04/09/2021  Medication Sig   aspirin 81 MG tablet Take 1 tablet (81 mg total) by mouth daily.   atorvastatin (LIPITOR) 20 MG tablet TAKE 1 TABLET ONCE DAILY.   azelastine (ASTELIN) 0.1 % nasal spray Place 2 sprays into both nostrils 2 (two) times daily. Use in each nostril as directed   Azelastine HCl 0.15 % SOLN Place 2 sprays into the nose in the morning and at bedtime.   benzonatate (TESSALON) 200 MG capsule Take 1 capsule (200 mg total) by mouth 2 (two) times daily as needed for cough.   colchicine 0.6 MG tablet Take 1 tablet (0.6 mg total) by mouth daily.   dabigatran (PRADAXA) 150 MG CAPS capsule TAKE (1) CAPSULE TWICE DAILY.   diphenhydrAMINE (BENADRYL) 25 MG tablet Take 25 mg by mouth at bedtime.    EPINEPHrine 0.3 mg/0.3 mL IJ SOAJ injection Inject 0.3 mLs (0.3 mg total) into the muscle as needed for anaphylaxis.   famotidine (PEPCID) 20 MG tablet Take 1 tablet (20 mg total) by mouth at bedtime.   fluorouracil (EFUDEX) 5 % cream Apply topically 2 (two) times daily.   furosemide (LASIX) 80 MG tablet Take one tablet daily alternating with a HALF tablet every other day   hydrOXYzine (ATARAX/VISTARIL) 10 MG tablet Take 10 mg by mouth at bedtime.   isosorbide mononitrate (IMDUR) 30 MG 24 hr tablet TAKE 1 TABLET ONCE DAILY.   Ketotifen Fumarate (ZADITOR OP) Apply 1 drop to eye daily as needed (itchy eyes).    levocetirizine (XYZAL) 5 MG tablet Take 5 mg by mouth daily.    levothyroxine (SYNTHROID) 100 MCG tablet Take 1 tablet (100 mcg total) by mouth daily.   meclizine (ANTIVERT) 25 MG tablet Take 1 tablet (25 mg total) by mouth every 4 (four) hours as needed for dizziness.   methocarbamol (ROBAXIN) 500 MG tablet Take 1 tablet (500 mg total) by mouth every 8 (eight) hours as needed for muscle spasms.   metoCLOPramide (REGLAN) 10 MG tablet Take 1 tablet every morning with breakfast   metolazone (ZAROXOLYN) 2.5 MG tablet Take 1 tab 2 times per week for up to 2 weeks or until weight is back to baseline.   metoprolol succinate (TOPROL-XL) 50 MG 24 hr tablet Take 1.5 tablets (75 mg total) by mouth daily. Take with or immediately following a meal.   montelukast (SINGULAIR) 10 MG tablet TAKE 1 TABLET ONCE DAILY IN THE EVENING.   Multiple Vitamins-Minerals (PRESERVISION AREDS PO) Take 2 tablets by mouth every morning.   omeprazole (PRILOSEC) 20 MG capsule TAKE (1) CAPSULE TWICE DAILY.   oxyCODONE (OXY IR/ROXICODONE) 5 MG immediate release tablet Take 1 tablet (5 mg total) by mouth every 12 (twelve) hours as needed for severe pain.   potassium bicarbonate (K-LYTE) 25 MEQ disintegrating tablet Take 25 mEq by mouth 2 (two) times daily.   potassium chloride SA (KLOR-CON) 20 MEQ tablet Take 2 tablets daily alternating with 1 tablet every other day   sacubitril-valsartan (ENTRESTO) 97-103 MG Take 1  tablet by mouth 2 (two) times daily.   spironolactone (ALDACTONE) 25 MG tablet TAKE 1 TABLET ONCE DAILY.   triamcinolone cream (KENALOG) 0.1 % Apply 1 application topically daily as needed for dry skin.   valACYclovir (VALTREX) 500 MG tablet Take 500 mg by mouth 2 (two) times daily as needed (cold sores).   No facility-administered encounter medications on file as of 04/09/2021.   Fill History: atorvastatin 20 mg tablet 01/24/2021 90   azelastine 137 mcg (0.1 %) nasal spray aerosol 04/03/2021 75   benzonatate 200 mg capsule 01/27/2021 90   colchicine 0.6 mg tablet 11/08/2020  30   Pradaxa 150 mg capsule 03/15/2021 90   famotidine 20 mg tablet 01/14/2021 90   fluticasone propionate 50 mcg/actuation nasal spray,suspension 04/03/2021 90   fluorouracil 5 % topical cream 01/12/2021 30   furosemide 80 mg tablet 02/19/2021 89   isosorbide mononitrate ER 30 mg tablet,extended release 24 hr 01/30/2021 90   levothyroxine 100 mcg tablet 03/02/2021 90   metoclopramide 10 mg tablet 04/07/2021 30   metoprolol succinate ER 50 mg tablet,extended release 24 hr 02/05/2021 90   montelukast 10 mg tablet 02/19/2021 90    omeprazole 20 mg capsule,delayed release 01/27/2021 90   potassium chloride ER 20 mEq tablet,extended release(part/cryst) 02/19/2021 90   Entresto 97 mg-103 mg tablet 03/13/2021 30   spironolactone 25 mg tablet 01/27/2021 90    metolazone 2.5 mg tablet 03/20/2021 18   Reviewed chart prior to disease state call. Spoke with patient regarding BP  Recent Office Vitals: BP Readings from Last 3 Encounters:  03/27/21 96/60  03/22/21 125/65  01/12/21 98/68   Pulse Readings from Last 3 Encounters:  03/27/21 67  03/22/21 65  01/12/21 67    Wt Readings from Last 3 Encounters:  03/27/21 275 lb (124.7 kg)  03/22/21 277 lb (125.6 kg)  01/12/21 275 lb (124.7 kg)     Kidney Function Lab Results  Component Value Date/Time   CREATININE 1.24 03/28/2021 07:30 AM   CREATININE 1.05 11/14/2020 01:13 PM   CREATININE 1.27 (H) 03/21/2020 09:43 AM   CREATININE 0.74 08/14/2016 01:11 PM   GFR 53.29 (L) 03/28/2021 07:30 AM   GFRNONAA 57 (L) 04/04/2020 11:32 AM   GFRAA 66 04/04/2020 11:32 AM    BMP Latest Ref Rng & Units 03/28/2021 11/14/2020 04/04/2020  Glucose 70 - 99 mg/dL 93 120(H) 104(H)  BUN 6 - 23 mg/dL 31(H) 25(H) 27  Creatinine 0.40 - 1.50 mg/dL 1.24 1.05 1.18  BUN/Creat Ratio 10 - 24 - - 23  Sodium 135 - 145 mEq/L 135 137 134  Potassium 3.5 - 5.1 mEq/L 4.7 4.6 5.1  Chloride 96 - 112 mEq/L 101 105 100  CO2 19 - 32 mEq/L '25 24 20  '$ Calcium 8.4 -  10.5 mg/dL 8.5 8.4 8.6    Current antihypertensive regimen:  Metoprolol '50mg'$  - take 1.5 tablets by mouth daily. Sacubitril - valsartan 97-103 - take 1 tablet by mouth two times daily.  Furosemide 80 mg 1 tablet daily Spironolactone 25 mg 1 tablet daily How often are you checking your Blood Pressure? 3-5x per week Current home BP readings: 7/19 - 108/64, 7/20 - 110/60, 7/21- 117/59, 7/22- 98/64, 7/23 - 115/56. Has not checked since then. What recent interventions/DTPs have been made by any provider to improve Blood Pressure control since last CPP Visit: None.  Any recent hospitalizations or ED visits since last visit with CPP? No  Adherence Review: Is the patient currently on ACE/ARB medication? Yes Does  the patient have >5 day gap between last estimated fill dates? No  Notes: Spoke with patient and reviewed all medications as listed. Patient reports taking all medications and no issues at this time. Patient is currently taking a whole '80mg'$  tablet of furosemide currently everyday due to some fluid retention. Patient states he has not taken his blood pressure since July because it was staying pretty consistent. I encouraged patient to check weekly and continue keeping a log. Patient was agreeable. Patient states for breakfast he tends to have a boiled egg and some toast with non salted butter and a cup of hot tea. Sometimes he has a bowl of cereal with skim milk and then he takes his medications after breakfast. Patient has a snack around 10 if nuts or fruit and then for lunch he tends to have a glass of V8 juice and some crackers. He does have a [place] sandwich sometimes. For dinner patient has 4 ounces of a meat and vegetables with slaw or a salad and a glass of water. He will have fruit before bed ad another snack around 3pm daily of nuts or fruit. Everyday at 5 patient has a beer and popcorn with his wife Monday through Friday. Patient does not add salt to his food unless he's eating a tomato.  For activity patient gets out of the house everyday and goes and walks different stores like [employer], [employer] or [employer]. Patient thanked me for my call.   Care Gaps:  AWV - completed on 03/22/21 Covid - 19 booster 4 - overdue since 08/30/20 Influenza vaccine - due  Star Rating Drugs:  Atorvastatin '20mg'$  - last filled on 01/24/21 90DS at [facility] Delene Loll) 97-'103mg'$  - PAP   [place]  Clinical Pharmacist Assistant [phone]

## 2021-04-10 ENCOUNTER — Ambulatory Visit (INDEPENDENT_AMBULATORY_CARE_PROVIDER_SITE_OTHER): Payer: Medicare Other

## 2021-04-10 DIAGNOSIS — I5042 Chronic combined systolic (congestive) and diastolic (congestive) heart failure: Secondary | ICD-10-CM | POA: Diagnosis not present

## 2021-04-10 DIAGNOSIS — Z95810 Presence of automatic (implantable) cardiac defibrillator: Secondary | ICD-10-CM | POA: Diagnosis not present

## 2021-04-11 ENCOUNTER — Telehealth: Payer: Self-pay | Admitting: Internal Medicine

## 2021-04-11 DIAGNOSIS — H353221 Exudative age-related macular degeneration, left eye, with active choroidal neovascularization: Secondary | ICD-10-CM | POA: Diagnosis not present

## 2021-04-11 LAB — CUP PACEART REMOTE DEVICE CHECK
Battery Remaining Longevity: 21 mo
Battery Voltage: 2.91 V
Brady Statistic AP VP Percent: 15.84 %
Brady Statistic AP VS Percent: 5.27 %
Brady Statistic AS VP Percent: 39.66 %
Brady Statistic AS VS Percent: 39.23 %
Brady Statistic RA Percent Paced: 17.49 %
Brady Statistic RV Percent Paced: 51.88 %
Date Time Interrogation Session: 20220815063104
HighPow Impedance: 78 Ohm
Implantable Lead Implant Date: 20180907
Implantable Lead Implant Date: 20180907
Implantable Lead Implant Date: 20180907
Implantable Lead Location: 753858
Implantable Lead Location: 753859
Implantable Lead Location: 753860
Implantable Lead Model: 4396
Implantable Lead Model: 5076
Implantable Pulse Generator Implant Date: 20180907
Lead Channel Impedance Value: 342 Ohm
Lead Channel Impedance Value: 342 Ohm
Lead Channel Impedance Value: 399 Ohm
Lead Channel Impedance Value: 456 Ohm
Lead Channel Impedance Value: 513 Ohm
Lead Channel Impedance Value: 817 Ohm
Lead Channel Pacing Threshold Amplitude: 0.625 V
Lead Channel Pacing Threshold Amplitude: 0.75 V
Lead Channel Pacing Threshold Amplitude: 1.125 V
Lead Channel Pacing Threshold Pulse Width: 0.4 ms
Lead Channel Pacing Threshold Pulse Width: 0.4 ms
Lead Channel Pacing Threshold Pulse Width: 1 ms
Lead Channel Sensing Intrinsic Amplitude: 0.25 mV
Lead Channel Sensing Intrinsic Amplitude: 0.25 mV
Lead Channel Sensing Intrinsic Amplitude: 7.875 mV
Lead Channel Sensing Intrinsic Amplitude: 7.875 mV
Lead Channel Setting Pacing Amplitude: 1.5 V
Lead Channel Setting Pacing Amplitude: 2 V
Lead Channel Setting Pacing Amplitude: 2.5 V
Lead Channel Setting Pacing Pulse Width: 0.4 ms
Lead Channel Setting Pacing Pulse Width: 1 ms
Lead Channel Setting Sensing Sensitivity: 0.3 mV

## 2021-04-11 NOTE — Telephone Encounter (Signed)
pt would like to get a message over to the nurse.. sent a transmission over Thursday and has a question about his water retainage.. please advise

## 2021-04-11 NOTE — Telephone Encounter (Signed)
See 04/10/2021 ICM for follow up.

## 2021-04-11 NOTE — Progress Notes (Signed)
EPIC Encounter for ICM Monitoring  Patient Name: Justin [name] is a 87 y.o. male Date: 04/11/2021 Primary Care Physican: Laurey Morale, MD Primary Cardiologist: Angelena Form Electrophysiologist: Vergie Living Pacing: 51.9%  (was 85.1 % on 11/14/2020 report) 02/14/2021 Office Weight: 275 lbs 03/12/2021 Weight: 281 lbs 03/18/2021 Weight: 272.4 03/28/2021   Weight: 272 lbs 04/06/2021 Weight: 276 lbs 04/11/2021 Weight: 273 lbs  Since 05-Apr-2021 AT/AF 283 Time in AT/AF 22.3 hr/day (93.0%) Longest AT/AF 5 hours                                                            Spoke with patient he has lost 6-7 lbs since taking Metolazone twice a week x 2 weeks with Furosemide 80 mg daily.    He continues to report following symptoms: SOB with most normal activities Feels sluggish Ankle swelling.  He believes his weight is still 7-10 lbs over baseline.      Optivol thoracic impedance suggesting dryness since 03/13/2021 (after taking Metolazone) and returned to normal on 8/13.   Optivol does not correlate with patient's symptoms.   Message sent 04/11/2021 to device clinic triage to review decrease in BiV Pacing and report suggest AT/AF since 04/04/2021    Prescribed: Furosemide 80 mg take 1 tablet every other day alternating with 0.5 tablet (40 mg total) every other day. Per [name] pharmacist, he may take extra 20-40 mg as needed for weight gain/swelling. KLC 20 mEq take 2 tablets every other day alternating with 1 tablet every other day. Spironolactone 25 mg take 1 tablet daily   Labs: 03/28/2021 Creatinine 1.24, BUN 31, Potassium 4.7, Sodium 135, GFR 53.29 11/14/2020 Creatinine 1.05, BUN 25, Potassium 4.6, Sodium 137, GFR 65.23 A complete set of results can be found in Results Review.   Recommendations:  Advised patient may need to be seen in the office since Metolazone x 4 doses has not resolved symptoms.  Will send copy to Dr Angelena Form for review and recommendations or patient needs to be  seen in the office.     Follow-up plan: ICM clinic phone appointment on 04/17/2021 to recheck fluid levels.   91 day device clinic remote transmission 07/09/2021.     EP/Cardiology Office Visits: Recall 01/08/2021 with Dr Angelena Form.  Recall 07/12/2021 with Dr [name]   Copy of ICM check sent to Dr. [name].    3 month ICM trend: 04/09/2021.    1 Year ICM trend:       [name], RN 04/11/2021 1:59 PM

## 2021-04-13 ENCOUNTER — Other Ambulatory Visit: Payer: Self-pay | Admitting: Cardiovascular Disease

## 2021-04-13 NOTE — Progress Notes (Signed)
Device clinic message below:  Simone Curia, RN  Adrea Sherpa Panda, RN Hi [name],   This is a scheduled remote so it will be reviewed by Dr. [name] and he will then make recommendations to [place] or sign it off. Thanks for the heads up!   Leigh

## 2021-04-13 NOTE — Progress Notes (Signed)
Spoke with patient and heart failure questions reviewed.  Pt still reporting SOB and weight is 174.4 lbs today.  His symptoms are not urgent at this point but did advised if symptoms become urgent to use ER.  Advised message sent to [facility] Scheduling department to call him to make earliest appointment available.  He agreed with plan and will use ER if symptoms become urgent.

## 2021-04-13 NOTE — Progress Notes (Signed)
Message sent to scheduler at [facility] street office to schedule an appointment with Dr Angelena Form or APP at the earliest appointment due to fluid symptoms.

## 2021-04-17 ENCOUNTER — Other Ambulatory Visit: Payer: Self-pay | Admitting: Family Medicine

## 2021-04-17 ENCOUNTER — Ambulatory Visit (INDEPENDENT_AMBULATORY_CARE_PROVIDER_SITE_OTHER): Payer: Medicare Other

## 2021-04-17 DIAGNOSIS — I5042 Chronic combined systolic (congestive) and diastolic (congestive) heart failure: Secondary | ICD-10-CM

## 2021-04-17 DIAGNOSIS — Z95810 Presence of automatic (implantable) cardiac defibrillator: Secondary | ICD-10-CM

## 2021-04-17 NOTE — Progress Notes (Signed)
EPIC Encounter for ICM Monitoring  Patient Name: Justin [name] is a 87 y.o. male Date: 04/17/2021 Primary Care Physican: Laurey Morale, MD Primary Cardiologist: Angelena Form Electrophysiologist: Vergie Living Pacing: 60.8% Effective 55.2% (was 85.1 % on 11/14/2020 report) 04/11/2021 Weight: 273 lbs 04/17/2021 Weight: 278 lbs   Clinical Status (09-Apr-2021 to 17-Apr-2021) Treated VT/VF 0 episodes  V. Pacing 60.8%  Lower Rate 50 bpm  Upper Rate 130 bpm AT/AF 324 episodes  Atrial Pacing 10.6%  Time in AT/AF 23.9 hr/day (99.8%)  (8/11 report showed AT/AF 0.0 hr/day (0.0%)                                                            Spoke with patient.      He continues to report following symptoms: SOB with most normal activities Feels sluggish Ankle swelling.  He believes his weight is still 7-10 lbs over baseline.   Weight increased 5 pounds since 8/17   Optivol thoracic impedance suggesting normal fluid levels.   Optivol does not correlate with patient's symptoms but possibilty the symptoms may be releated to changes in rhythm.    Phone discussion with device clinic nurse Trish Fountain, RN regarding 04/17/2021 report due increased AT/AF burden and decreased pacing.    Prescribed: Furosemide 80 mg take 1 tablet every other day alternating with 0.5 tablet (40 mg total) every other day. Per [name] pharmacist, he may take extra 20-40 mg as needed for weight gain/swelling. KLC 20 mEq take 2 tablets every other day alternating with 1 tablet every other day. Spironolactone 25 mg take 1 tablet daily   Labs: 03/28/2021 Creatinine 1.24, BUN 31, Potassium 4.7, Sodium 135, GFR 53.29 11/14/2020 Creatinine 1.05, BUN 25, Potassium 4.6, Sodium 137, GFR 65.23 A complete set of results can be found in Results Review.   Recommendations:  Advised received note from Dr Camillia Herter and his nurse will contact him to work into the schedule instead of waiting until September to be seen. Also advised he  should receive a call from device clinic nurse today to discuss recommendations regarding his report showing AT/AF.     Follow-up plan: ICM clinic phone appointment on 05/14/2021.   91 day device clinic remote transmission 07/09/2021.     EP/Cardiology Office Visits: Recall 01/08/2021 with Dr Angelena Form.  Recall 07/12/2021 with Dr [name]   Copy of ICM check sent to Dr. [name].     3 month ICM trend: 04/17/2021.    1 Year ICM trend:       [name], RN 04/17/2021 9:51 AM

## 2021-04-17 NOTE — Progress Notes (Signed)
Incoming call from Sharman Cheek, RN [facility]) to collaborate on patients s/s of volume overload and recent onset of AF. Today's presenting EGM AF with intermittent VS/BVP. AF burden has increased to 99.8%. 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 Successful telephone encounter to patient to discuss recommendation for AF Clinic secondary to symptoms. Patient agreeable. Will send referral to AF Clinic. Patient provided ED precautions.

## 2021-04-17 NOTE — Progress Notes (Signed)
Message Received: [name], [name], MD  Rahma Meller Panda, RN; Rodman Key, RN Michalene, Can we get him on to see me this week at 10:15 am on Friday the 26th at the end of my structural clinic? Justin [name]

## 2021-04-18 ENCOUNTER — Telehealth: Payer: Self-pay | Admitting: Internal Medicine

## 2021-04-18 ENCOUNTER — Telehealth: Payer: Self-pay | Admitting: Pharmacist

## 2021-04-18 NOTE — Telephone Encounter (Signed)
Pt is calling to give the updted fax number for the assistance program [phone] for the entresto medication

## 2021-04-18 NOTE — Telephone Encounter (Signed)
Spoke with pt who states Entresto application needs to be faxed to [phone].    Message from chronic disease management today left message with Caryl Pina to have Dr. [name] nurse or medical assistant to refax entresto patient assitance application to [employer] at [phone]. Stephannie Peters  I do not see any information where assistance with this application was ever requested or completed. Will forward to [place] for review.

## 2021-04-18 NOTE — Chronic Care Management (AMB) (Signed)
Chronic Care Management Pharmacy Assistant   Name: Justin [name]  MRN: [id number] DOB: 18-Apr-1934  Per [name] the clinical pharmacist request I called [employer] and [employer] to follow up on the patient assistance applications for Pradaxa and Entresto due to patient not hearing back by companies pertaining to his applications status. I spoke with Karolee Ohs at [employer] first regarding Arboriculturist. Karolee Ohs stated that they have never received the application back from provider or patient and she requested the application be faxed to [phone]. [name] made aware of update. I then called [employer] in regards to Pradaxa application and spoke with [facility] who stated that they did receive the application from Dr. Sarajane Jews on 03/22/21. However, they sent a letter to his office and patient on 03/24/21 stating that Pradaxa is no longer eligible for patient assistance due to the fact that the medication will be having a generic available starting next year. This was put into effect on 02/23/21 so therefore patient did not apply quite in time to receive the patient assistance. She suggested patient be switched to an alternative until next year. No exact date provided of when generic will be available. [name] made aware.  Medications: Outpatient Encounter Medications as of 04/18/2021  Medication Sig   aspirin 81 MG tablet Take 1 tablet (81 mg total) by mouth daily.   atorvastatin (LIPITOR) 20 MG tablet TAKE 1 TABLET ONCE DAILY.   azelastine (ASTELIN) 0.1 % nasal spray Place 2 sprays into both nostrils 2 (two) times daily. Use in each nostril as directed   Azelastine HCl 0.15 % SOLN Place 2 sprays into the nose in the morning and at bedtime.   benzonatate (TESSALON) 200 MG capsule Take 1 capsule (200 mg total) by mouth 2 (two) times daily as needed for cough.   colchicine 0.6 MG tablet Take 1 tablet (0.6 mg total) by mouth daily.   dabigatran (PRADAXA) 150 MG CAPS capsule TAKE (1)  CAPSULE TWICE DAILY.   diphenhydrAMINE (BENADRYL) 25 MG tablet Take 25 mg by mouth at bedtime.    ENTRESTO 97-103 MG TAKE 1 TABLET BY MOUTH TWICE DAILY.   EPINEPHrine 0.3 mg/0.3 mL IJ SOAJ injection Inject 0.3 mLs (0.3 mg total) into the muscle as needed for anaphylaxis.   famotidine (PEPCID) 20 MG tablet Take 1 tablet (20 mg total) by mouth at bedtime.   fluorouracil (EFUDEX) 5 % cream Apply topically 2 (two) times daily.   furosemide (LASIX) 80 MG tablet Take one tablet daily alternating with a HALF tablet every other day   hydrOXYzine (ATARAX/VISTARIL) 10 MG tablet Take 10 mg by mouth at bedtime.   isosorbide mononitrate (IMDUR) 30 MG 24 hr tablet TAKE 1 TABLET ONCE DAILY.   Ketotifen Fumarate (ZADITOR OP) Apply 1 drop to eye daily as needed (itchy eyes).    levocetirizine (XYZAL) 5 MG tablet Take 5 mg by mouth daily.   levothyroxine (SYNTHROID) 100 MCG tablet Take 1 tablet (100 mcg total) by mouth daily.   meclizine (ANTIVERT) 25 MG tablet Take 1 tablet (25 mg total) by mouth every 4 (four) hours as needed for dizziness.   methocarbamol (ROBAXIN) 500 MG tablet Take 1 tablet (500 mg total) by mouth every 8 (eight) hours as needed for muscle spasms.   metoCLOPramide (REGLAN) 10 MG tablet Take 1 tablet every morning with breakfast   metolazone (ZAROXOLYN) 2.5 MG tablet Take 1 tab 2 times per week for up to 2 weeks or until weight is back to baseline.  metoprolol succinate (TOPROL-XL) 50 MG 24 hr tablet Take 1.5 tablets (75 mg total) by mouth daily. Take with or immediately following a meal.   montelukast (SINGULAIR) 10 MG tablet TAKE 1 TABLET ONCE DAILY IN THE EVENING.   Multiple Vitamins-Minerals (PRESERVISION AREDS PO) Take 2 tablets by mouth every morning.   omeprazole (PRILOSEC) 20 MG capsule TAKE (1) CAPSULE TWICE DAILY.   oxyCODONE (OXY IR/ROXICODONE) 5 MG immediate release tablet Take 1 tablet (5 mg total) by mouth every 12 (twelve) hours as needed for severe pain.   potassium  bicarbonate (K-LYTE) 25 MEQ disintegrating tablet Take 25 mEq by mouth 2 (two) times daily.   potassium chloride SA (KLOR-CON) 20 MEQ tablet Take 2 tablets daily alternating with 1 tablet every other day   spironolactone (ALDACTONE) 25 MG tablet TAKE 1 TABLET ONCE DAILY.   triamcinolone cream (KENALOG) 0.1 % Apply 1 application topically daily as needed for dry skin.   valACYclovir (VALTREX) 500 MG tablet Take 500 mg by mouth 2 (two) times daily as needed (cold sores).   No facility-administered encounter medications on file as of 04/18/2021.    Care Gaps:  AWV - completed 03/22/21 COVID-19 vaccine booster 4  - overdue since 08/30/20 Flu vaccine - due  Star Rating Drugs:  Atorvastatin '20mg'$  - last filled on 01/24/21 90DS at [facility] Entresto 97-'103mg'$  - last filled on 04/13/21 30DS at [facility] Pharmacist Assistant [phone]

## 2021-04-19 ENCOUNTER — Ambulatory Visit (HOSPITAL_COMMUNITY)
Admission: RE | Admit: 2021-04-19 | Discharge: 2021-04-19 | Disposition: A | Discharge status: Home / Self Care | Payer: Medicare Other | Source: Ambulatory Visit | Attending: Physician Assistant | Admitting: Physician Assistant

## 2021-04-19 ENCOUNTER — Other Ambulatory Visit: Payer: Self-pay

## 2021-04-19 ENCOUNTER — Encounter (HOSPITAL_COMMUNITY): Payer: Self-pay | Admitting: Physician Assistant

## 2021-04-19 VITALS — BP 102/58 | HR 101 | Ht 66.0 in | Wt 281.8 lb

## 2021-04-19 DIAGNOSIS — Z8249 Family history of ischemic heart disease and other diseases of the circulatory system: Secondary | ICD-10-CM | POA: Diagnosis not present

## 2021-04-19 DIAGNOSIS — Z951 Presence of aortocoronary bypass graft: Secondary | ICD-10-CM | POA: Diagnosis not present

## 2021-04-19 DIAGNOSIS — Z87891 Personal history of nicotine dependence: Secondary | ICD-10-CM | POA: Diagnosis not present

## 2021-04-19 DIAGNOSIS — D6869 Other thrombophilia: Secondary | ICD-10-CM | POA: Diagnosis not present

## 2021-04-19 DIAGNOSIS — Z6841 Body Mass Index (BMI) 40.0 and over, adult: Secondary | ICD-10-CM | POA: Insufficient documentation

## 2021-04-19 DIAGNOSIS — I5022 Chronic systolic (congestive) heart failure: Secondary | ICD-10-CM | POA: Diagnosis not present

## 2021-04-19 DIAGNOSIS — I110 Hypertensive heart disease with heart failure: Secondary | ICD-10-CM | POA: Diagnosis not present

## 2021-04-19 DIAGNOSIS — I4819 Other persistent atrial fibrillation: Secondary | ICD-10-CM | POA: Insufficient documentation

## 2021-04-19 DIAGNOSIS — Z952 Presence of prosthetic heart valve: Secondary | ICD-10-CM | POA: Diagnosis not present

## 2021-04-19 DIAGNOSIS — E669 Obesity, unspecified: Secondary | ICD-10-CM | POA: Insufficient documentation

## 2021-04-19 DIAGNOSIS — Z79899 Other long term (current) drug therapy: Secondary | ICD-10-CM | POA: Insufficient documentation

## 2021-04-19 DIAGNOSIS — Z95810 Presence of automatic (implantable) cardiac defibrillator: Secondary | ICD-10-CM | POA: Diagnosis not present

## 2021-04-19 DIAGNOSIS — I2510 Atherosclerotic heart disease of native coronary artery without angina pectoris: Secondary | ICD-10-CM | POA: Insufficient documentation

## 2021-04-19 DIAGNOSIS — Z888 Allergy status to other drugs, medicaments and biological substances status: Secondary | ICD-10-CM | POA: Diagnosis not present

## 2021-04-19 DIAGNOSIS — E785 Hyperlipidemia, unspecified: Secondary | ICD-10-CM | POA: Insufficient documentation

## 2021-04-19 DIAGNOSIS — I252 Old myocardial infarction: Secondary | ICD-10-CM | POA: Insufficient documentation

## 2021-04-19 LAB — BASIC METABOLIC PANEL
Anion gap: 8 (ref 5–15)
BUN: 25 mg/dL — ABNORMAL HIGH (ref 8–23)
CO2: 21 mmol/L — ABNORMAL LOW (ref 22–32)
Calcium: 8 mg/dL — ABNORMAL LOW (ref 8.9–10.3)
Chloride: 106 mmol/L (ref 98–111)
Creatinine, Ser: 1.07 mg/dL (ref 0.61–1.24)
GFR, Estimated: >60 mL/min (ref >60)
Glucose, Bld: 114 mg/dL — ABNORMAL HIGH (ref 70–99)
Potassium: 4.8 mmol/L (ref 3.5–5.1)
Sodium: 135 mmol/L (ref 135–145)

## 2021-04-19 LAB — CBC
HCT: 41.8 % (ref 39.0–52.0)
Hemoglobin: 13 g/dL (ref 13.0–17.0)
MCH: 28 pg (ref 26.0–34.0)
MCHC: 31.1 g/dL (ref 30.0–36.0)
MCV: 90.1 fL (ref 80.0–100.0)
Platelets: 241 10*3/uL (ref 150–400)
RBC: 4.64 MIL/uL (ref 4.22–5.81)
RDW: 16.2 % — ABNORMAL HIGH (ref 11.5–15.5)
WBC: 10.9 10*3/uL — ABNORMAL HIGH (ref 4.0–10.5)
nRBC: 0 % (ref 0.0–0.2)

## 2021-04-19 NOTE — Patient Instructions (Signed)
Cardioversion scheduled for Tuesday, August 30th  - Arrive at the [facility] and go to admitting at [facility] not eat or drink anything after midnight the night prior to your procedure.  - Take all your morning medication (except diabetic medications) with a sip of water prior to arrival.  - You will not be able to drive home after your procedure.  - Do NOT miss any doses of your blood thinner - if you should miss a dose please notify our office immediately.  - If you feel as if you go back into normal rhythm prior to scheduled cardioversion, please notify our office immediately. If your procedure is canceled in the cardioversion suite you will be charged a cancellation fee. Patients will be asked to: to mask in public and hand hygiene (no longer quarantine) in the 3 days prior to surgery, to report if any COVID-19-like illness or household contacts to COVID-19 to determine need for testing

## 2021-04-19 NOTE — Progress Notes (Signed)
Primary Care Physician: Laurey Morale, MD Primary Cardiologist: Dr Angelena Form Primary Electrophysiologist: Dr [name] Referring Physician: Device clinic   Justin [name] is a 87 y.o. 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 Patient is on Pradaxa for a CHADS2VASC score of 5. Patient has noted increased fluid retention over the last several weeks with increased dyspnea on exertion. He has had variable results with increasing diuretics. Optivol on 8/23 with normal thoracic impedance. Patient denies orthopnea or PND. His device has shown persistent afib since 04/05/21.   Today, he denies symptoms of palpitations, chest pain, orthopnea, PND, dizziness, presyncope, syncope, snoring, daytime somnolence, bleeding, or neurologic sequela. The patient is tolerating medications without difficulties and is otherwise without complaint today.    Atrial Fibrillation Risk Factors:  he does not have symptoms or diagnosis of sleep apnea. he does not have a history of rheumatic fever. he does have a history of alcohol use.   he has a BMI of Body mass index is 45.48 kg/m.[name] Filed Weights   04/19/21 1432  Weight: 127.8 kg    Family History  Problem Relation Age of Onset   Heart attack Father 30   Allergic rhinitis Father    Asthma Father    Heart failure Mother 15   Uterine cancer Mother    Breast cancer Mother    Colon cancer Neg Hx    Esophageal cancer Neg Hx      Atrial Fibrillation Management history:  Previous antiarrhythmic drugs: none Previous cardioversions: none Previous ablations: none CHADS2VASC score: 5 Anticoagulation history: Pradaxa    Past Medical History:  Diagnosis Date   Age-related macular degeneration, dry, both eyes    Allergic    "24/7; 365 days/year; I'm allergic to pollens, dust, all southern  grasses/trees, mold, mildue, cats, dogs" (01/07/2017)   Anal fissure    Asthma    sees [name]    Benign prostatic hypertrophy    (sees Dr. Denman George   CAD (coronary artery disease)    a. s/p CABG 2007. b. Cath 08/2016 - 4/4 patent grafts.   Carotid bruit    carotid u/s 10/10: 0.39% bilaterally   Chronic combined systolic and diastolic CHF (congestive heart failure) (HCC)    Complication of anesthesia 1980s   "w/anal cyst OR, he gave me a saddle block then put a narcotic in spinal cord; had a severe reaction to that" (01/07/2017)   Congestive heart failure (CHF) [facility])    ED (erectile dysfunction)    Family history of adverse reaction to anesthesia    "daughter wakes up during OR" (01/07/2017)   GERD (gastroesophageal reflux disease)    Gout    HTN (hypertension)    Hx of colonic polyps    (sees Dr. Henrene Pastor)   Hyperlipidemia    Hypothyroidism    Moderate to severe aortic stenosis    a. s/p TAVR 02/2017.   Myocardial infarction [facility]) ~ 2000   Obesity    Osteoarthritis    "was in my knees, hands" (01/07/2017 )   PAF (paroxysmal atrial fibrillation) ([place])    a. documented post TAVR.   Precancerous skin lesion    (sees Dr. 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   29 mm Edwards Sapien 3  transcatheter heart valve placed via percutaneous right transfemoral approach   Past Surgical History:  Procedure Laterality Date   ANUS SURGERY     "opened it back up cause it wouldn't heal; wound up w/a fissure" (01/07/2017)   BIV ICD INSERTION CRT-D N/A 05/02/2017   Procedure: BIV ICD INSERTION CRT-D;  Surgeon: Deboraha Sprang, MD;  Location: [place] CV LAB;  Service: Cardiovascular;  Laterality: N/A;   CARDIAC CATHETERIZATION  10/29/2005   CARDIAC CATHETERIZATION N/A 08/27/2016   Procedure: Right/Left Heart Cath and Coronary/Graft Angiography;  Surgeon: Burnell Blanks, MD;  Location: [place] CV  LAB;  Service: Cardiovascular;  Laterality: N/A;   CATARACT EXTRACTION W/ INTRAOCULAR LENS  IMPLANT, BILATERAL Bilateral    CATARACT EXTRACTION, BILATERAL  2012   COLONOSCOPY  06/30/2008   no repeats needed    COLONOSCOPY     had 3 or 4 in the past    CORONARY ARTERY BYPASS GRAFT  2007   "CABG X4"   CYST EXCISION PERINEAL  1980s   HAMMER TOE SURGERY Bilateral    JOINT REPLACEMENT     KNEE ARTHROPLASTY  07/30/2011   Procedure: COMPUTER ASSISTED TOTAL KNEE ARTHROPLASTY;  Surgeon: Meredith Pel;  Location: [place];  Service: Orthopedics;  Laterality: Left;  left total knee arthroplasty   MASTECTOMY SUBCUTANEOUS Bilateral    MULTIPLE TOOTH EXTRACTIONS     ORIF FINGER / THUMB FRACTURE Right ~ 1980   "repair of thumb injury"   PROSTATE BIOPSY     REPLACEMENT TOTAL KNEE BILATERAL Bilateral 2012   TEE WITHOUT CARDIOVERSION N/A 03/04/2017   Procedure: TRANSESOPHAGEAL ECHOCARDIOGRAM (TEE);  Surgeon: Burnell Blanks, MD;  Location: [place];  Service: Open Heart Surgery;  Laterality: N/A;   TOTAL KNEE REVISION Right 05/18/2019   Procedure: RIGHT PATELLA REVISION/REMOVAL;  Surgeon: Meredith Pel, MD;  Location: [place];  Service: Orthopedics;  Laterality: Right;   TRANSCATHETER AORTIC VALVE REPLACEMENT, TRANSFEMORAL N/A 03/04/2017   Procedure: TRANSCATHETER AORTIC VALVE REPLACEMENT, TRANSFEMORAL;  Surgeon: Burnell Blanks, MD;  Location: [place];  Service: Open Heart Surgery;  Laterality: N/A;    Current Outpatient Medications  Medication Sig Dispense Refill   aspirin 81 MG tablet Take 1 tablet (81 mg total) by mouth daily. 30 tablet 0   atorvastatin (LIPITOR) 20 MG tablet TAKE 1 TABLET ONCE DAILY. 90 tablet 0   Azelastine HCl 0.15 % SOLN Place 2 sprays into the nose in the morning and at bedtime. 30 mL 11   benzonatate (TESSALON) 200 MG capsule Take 1 capsule (200 mg total) by mouth 2 (two) times daily as needed for cough. 180 capsule 3   colchicine 0.6 MG tablet Take 1 tablet  (0.6 mg total) by mouth daily. 30 tablet 0   dabigatran (PRADAXA) 150 MG CAPS capsule TAKE (1) CAPSULE TWICE DAILY. 180 capsule 3   diphenhydrAMINE (BENADRYL) 25 MG tablet Take 25 mg by mouth at bedtime.      ENTRESTO 97-103 MG TAKE 1 TABLET BY MOUTH TWICE DAILY. 180 tablet 2   EPINEPHrine 0.3 mg/0.3 mL IJ SOAJ injection Inject 0.3 mLs (0.3 mg total) into the muscle as needed for anaphylaxis. 2 Device 1   famotidine (PEPCID) 20 MG tablet Take 1 tablet (20 mg total) by mouth at bedtime. 90 tablet 3   fluorouracil (EFUDEX) 5 % cream Apply topically 2 (two) times daily.     furosemide (LASIX) 80 MG tablet Take one tablet daily alternating with a HALF tablet every other day 70  tablet 3   hydrOXYzine (ATARAX/VISTARIL) 10 MG tablet Take 10 mg by mouth at bedtime.     isosorbide mononitrate (IMDUR) 30 MG 24 hr tablet TAKE 1 TABLET ONCE DAILY. 90 tablet 0   Ketotifen Fumarate (ZADITOR OP) Apply 1 drop to eye daily as needed (itchy eyes).      levocetirizine (XYZAL) 5 MG tablet Take 5 mg by mouth daily.     levothyroxine (SYNTHROID) 100 MCG tablet Take 1 tablet (100 mcg total) by mouth daily. 90 tablet 3   meclizine (ANTIVERT) 25 MG tablet Take 1 tablet (25 mg total) by mouth every 4 (four) hours as needed for dizziness. 60 tablet 2   methocarbamol (ROBAXIN) 500 MG tablet Take 1 tablet (500 mg total) by mouth every 8 (eight) hours as needed for muscle spasms. 30 tablet 0   metoCLOPramide (REGLAN) 10 MG tablet Take 1 tablet every morning with breakfast 30 tablet 2   metolazone (ZAROXOLYN) 2.5 MG tablet Take 1 tab 2 times per week for up to 2 weeks or until weight is back to baseline. 5 tablet 0   metoprolol succinate (TOPROL-XL) 50 MG 24 hr tablet Take 1.5 tablets (75 mg total) by mouth daily. Take with or immediately following a meal. 135 tablet 1   montelukast (SINGULAIR) 10 MG tablet TAKE 1 TABLET ONCE DAILY IN THE EVENING. 90 tablet 3   Multiple Vitamins-Minerals (PRESERVISION AREDS PO) Take 2 tablets  by mouth every morning.     omeprazole (PRILOSEC) 20 MG capsule TAKE (1) CAPSULE TWICE DAILY. 180 capsule 3   oxyCODONE (OXY IR/ROXICODONE) 5 MG immediate release tablet Take 1 tablet (5 mg total) by mouth every 12 (twelve) hours as needed for severe pain. 30 tablet 0   potassium bicarbonate (K-LYTE) 25 MEQ disintegrating tablet Take 25 mEq by mouth 2 (two) times daily.     potassium chloride SA (KLOR-CON) 20 MEQ tablet Take 2 tablets daily alternating with 1 tablet every other day 135 tablet 3   spironolactone (ALDACTONE) 25 MG tablet TAKE 1 TABLET ONCE DAILY. 90 tablet 1   triamcinolone cream (KENALOG) 0.1 % Apply 1 application topically daily as needed for dry skin.     valACYclovir (VALTREX) 500 MG tablet Take 500 mg by mouth 2 (two) times daily as needed (cold sores).     No current facility-administered medications for this encounter.    Allergies  Allergen Reactions   Peanut-Containing Drug Products Anaphylaxis   Sulfonamide Derivatives Anaphylaxis   Amlodipine Swelling    Swelling in ankles   Eliquis [Apixaban] Other (See Comments)    Back/hip pain   Lisinopril Cough    cough   Xarelto [Rivaroxaban] Other (See Comments)    Back/hip pain    Social History   Socioeconomic History   Marital status: Married    Spouse name: Not on file   Number of children: 3   Years of education: Not on file   Highest education level: Not on file  Occupational History   Occupation: Clinical biochemist    Comment: builds malls  Tobacco Use   Smoking status: Former    Packs/day: 3.50    Years: 13.00    Pack years: 45.50    Types: Cigarettes    Quit date: 1963    Years since quitting: 59.6   Smokeless tobacco: Never   Tobacco comments:    Former smoker 04/19/21  Vaping Use   Vaping Use: Never used  Substance and Sexual Activity   Alcohol use: Not Currently  Alcohol/week: 7.0 standard drinks    Types: 7 Cans of beer per week    Comment: 1 beer daily after 5pm (04/19/21)   Drug  use: No   Sexual activity: Never  Other Topics Concern   Not on file  Social History Narrative   FH of CAD, Male 1st degree relative less than age 15.   Social Determinants of Health   Financial Resource Strain: Low Risk    Difficulty of Paying Living Expenses: Not very hard  Food Insecurity: No Food Insecurity   Worried About Charity fundraiser in the Last Year: Never true   Ran Out of Food in the Last Year: Never true  Transportation Needs: No Transportation Needs   Lack of Transportation (Medical): No   Lack of Transportation (Non-Medical): No  Physical Activity: Insufficiently Active   Days of Exercise per Week: 4 days   Minutes of Exercise per Session: 30 min  Stress: No Stress Concern Present   Feeling of Stress : Not at all  Social Connections: Moderately Isolated   Frequency of Communication with Friends and Family: Three times a week   Frequency of Social Gatherings with Friends and Family: Three times a week   Attends Religious Services: Never   Active Member of Clubs or Organizations: No   Attends Archivist Meetings: Never   Marital Status: Married  Human resources officer Violence: Not At Risk   Fear of Current or Ex-Partner: No   Emotionally Abused: No   Physically Abused: No   Sexually Abused: No     ROS- All systems are reviewed and negative except as per the HPI above.  Physical Exam: Vitals:   04/19/21 1432  BP: (!) 102/58  Pulse: (!) 101  Weight: 127.8 kg  Height: '5\' 6"'$  (1.676 m)    GEN- The patient is a well appearing elderly obese male, alert and oriented x 3 today.   Head- normocephalic, atraumatic Eyes-  Sclera clear, conjunctiva pink Ears- hearing intact Oropharynx- clear Neck- supple  Lungs- Clear to ausculation bilaterally, normal work of breathing Heart- irregular rate and rhythm, no murmurs, rubs or gallops  GI- soft, NT, ND, + BS Extremities- no clubbing, cyanosis. 1-2+ bilateral edema MS- no significant deformity or  atrophy Skin- no rash or lesion Psych- euthymic mood, full affect Neuro- strength and sensation are intact  Wt Readings from Last 3 Encounters:  04/19/21 127.8 kg  03/27/21 124.7 kg  03/22/21 125.6 kg    EKG today demonstrates  V pacing with underlying afib Vent. rate 101 BPM PR interval * ms QRS duration 132 ms QT/QTcB 406/526 ms  Echo 04/29/19 demonstrated  1. The left ventricle has moderately reduced systolic function, with an  ejection fraction of 35-40%. The cavity size was normal. There is  moderately increased left ventricular wall thickness. Left ventricular  diastolic Doppler parameters are consistent with impaired relaxation. Indeterminate filling pressures.   2. Severe hypokinesis of the left ventricular, entire anterior wall,  anteroseptal wall and apical segment.   3. The right ventricle has moderately reduced systolic function. The  cavity was normal. There is no increase in right ventricular wall  thickness.   4. Left atrial size was mildly dilated.   5. Right atrial size was mildly dilated.   6. The mitral valve is abnormal. Mild thickening of the mitral valve  leaflet. There is mild to moderate mitral annular calcification present.   7. The tricuspid valve is grossly normal.   8. A 9m an EOletta Lamas  Edwards Sapien bioprosthetic aortic valve (TAVR) valve is present in the aortic position. Procedure Date: 03/13/2019 Normal aortic valve prosthesis.   9. No stenosis of the aortic valve.  10. The aorta is abnormal unless otherwise noted.  11. There is mild dilatation of the ascending aorta measuring 40 mm.  12. When compared to the prior study: 02/06/2018: LVEF 40-45%, TAVR mean gradient 9 mmHg, aorta 3.9 cm.   SUMMARY     LVEF 35-40%, moderate LVH, severe anterior, apical and anteroseptal  hypokinesis/dyskinesis, moderate RV systolic dysfunction, s/p AICD,  mild biatrial enlargement, s/p 29 mm Edwards Sapien 3 TAVR, no  perivalvular leak - mean gradient 12 mmHg,  dilated ascending aorta   Epic records are reviewed at length today  CHA2DS2-VASc Score = 5  The patient's score is based upon: CHF History: Yes HTN History: Yes Diabetes History: No Stroke History: No Vascular Disease History: Yes Age Score: 2 Gender Score: 0     ASSESSMENT AND PLAN: 1. Persistent Atrial Fibrillation (ICD10:  I48.19) The patient's CHA2DS2-VASc score is 5, indicating a 7.2% annual risk of stroke.   Patient has been in afib since 04/05/21. Will plan for DCCV. Check bmet/cbc.  Continue Pradaxa 150 mg BID, patient denies any missed doses in the last 3 weeks.  Continue Toprol 75 mg daily  2. Secondary Hypercoagulable State (ICD10:  D68.69) The patient is at significant risk for stroke/thromboembolism based upon his CHA2DS2-VASc Score of 5.  Continue Dabigatran (Pradaxa).   3. Obesity Body mass index is 45.48 kg/m. Lifestyle modification was discussed at length including regular exercise and weight reduction.  4. CAD S/p CABG 2007 No anginal symptoms.  5. Chronic systolic CHF ICM S/p CRT-D, followed by Dr [name] and the device clinic. Optivol not correlating with symptoms but his weight is up and he has increased lower extremity edema.  Check bmet as above. Defer diuretic changes to Dr Angelena Form who he sees tomorrow. If he is admitted for IV diuretics, could consider DCCV inpatient.   6. VHD S/p TAVR  7. HTN Stable, no changes today.   Follow up with Dr Angelena Form, Coletta Memos, and Dr [name] as scheduled.    [facility] [address] [place], [place] [address] [phone] 04/19/2021 2:42 PM

## 2021-04-19 NOTE — Telephone Encounter (Signed)
**Note De-Identified Justin [name] Obfuscation** The pt states that the pharmacist at Dr Thereasa Distance (his PCP) office is taking care of his Pradaxa and Entresto pt asst applications and that to his knowledge nothing is needed from Dr Aquilla Hacker office. He states that he s/w someone at Dr Barbie Banner office yesterday who confirmed that they received his applications in the mail and are working on them now.  I did advise the pt that if he or Dr Barbie Banner office needs anything from [place] concerning Pt Asst for his Delene Loll and/or Pradaxa to call us at [phone].

## 2021-04-20 ENCOUNTER — Ambulatory Visit: Payer: Medicare Other | Admitting: Cardiovascular Disease

## 2021-04-20 ENCOUNTER — Encounter: Payer: Self-pay | Admitting: Cardiovascular Disease

## 2021-04-20 VITALS — BP 108/60 | HR 56 | Ht 66.0 in | Wt 280.6 lb

## 2021-04-20 DIAGNOSIS — I10 Essential (primary) hypertension: Secondary | ICD-10-CM | POA: Diagnosis not present

## 2021-04-20 DIAGNOSIS — I255 Ischemic cardiomyopathy: Secondary | ICD-10-CM | POA: Diagnosis not present

## 2021-04-20 DIAGNOSIS — I350 Nonrheumatic aortic (valve) stenosis: Secondary | ICD-10-CM

## 2021-04-20 DIAGNOSIS — I480 Paroxysmal atrial fibrillation: Secondary | ICD-10-CM

## 2021-04-20 DIAGNOSIS — I5043 Acute on chronic combined systolic (congestive) and diastolic (congestive) heart failure: Secondary | ICD-10-CM

## 2021-04-20 DIAGNOSIS — I2510 Atherosclerotic heart disease of native coronary artery without angina pectoris: Secondary | ICD-10-CM | POA: Diagnosis not present

## 2021-04-20 DIAGNOSIS — Z952 Presence of prosthetic heart valve: Secondary | ICD-10-CM

## 2021-04-20 MED ORDER — FUROSEMIDE 80 MG PO TABS: 80.0000 mg | ORAL_TABLET | Freq: Every day | ORAL | 3 refills | Status: DC

## 2021-04-20 MED ORDER — POTASSIUM CHLORIDE CRYS ER 20 MEQ PO TBCR: 40.0000 meq | EXTENDED_RELEASE_TABLET | Freq: Every day | ORAL | 3 refills | Status: DC

## 2021-04-20 MED ORDER — METOLAZONE 2.5 MG PO TABS: ORAL_TABLET | ORAL | 3 refills | Status: DC

## 2021-04-20 NOTE — H&P (View-Only) (Signed)
Chief Complaint  Patient presents with   Follow-up    Dyspnea, CHF    History of Present Illness: 87 yo male with history of HTN, HLD, severe aortic stenosis s/p TAVR, PAF, chronic diastolic and systolic CHF, CAD s/p 4V CABG, syncope, junctional bradycardia, PVCs, non-sustained VT and LBBB s/p ICD implantation who is here today for cardiac follow up. He is known to have CAD s/p 4V CABG in March 2007 (LIMA to LAD, SVG to Diagonal, SVG to OM, SVG to RCA). Last cardiac cath in January 2018 with 4/4 patent bypass grafts. He had progression of his aortic stenosis with worsened dyspnea and acute on chronic CHF in the spring of 2018. Echo May 2018 with mildly reduced LV systolic function, [id number]. His aortic stenosis was severe. He underwent TAVR on 03/04/17 with a 29 mm Edwards Sapien 3 valve from the right transfemoral approach.  He developed atrial fibrillation following his procedure and was placed on IV amiodarone with conversion to sinus rhythm. Follow up visit in our office 03/19/17 and pt noted to have irregular rhythm with intraventricular conduction delay and was felt by our EP team to represent 2:1 conduction of competing fast and slow pathways. Cardiac monitor with sinus rhythm with periods of junctional rhythm, frequent PVCs, frequent PACs and runs of a wide complex tachycardia. ICD was implanted on 05/03/17. Echo September 2020 with LVEF=35-40%. He has since been changed from Losartan to [facility]. Carotid artery dopplers March 2021 with mld bilateral carotid artery disease. He has had trouble with fluid retention over the the past two months. Lasix was increased to 80 mg daily and metolazone 2.5 mg added two days per week. Recent Optival readings suggest normal fluid levels/normal thoracic impedence. Device interrogation with persistent atrial fibrillation since 04/05/21. He was seen in the atrial fib clinic 04/19/21 and plans for cardioversion next week.   He is here today for follow up. The  patient denies any chest pain, palpitations, dizziness, near syncope or syncope. He endorses ongoing dyspnea, LE edema but denies orthopnea or PND.    Primary Care Physician: Laurey Morale, MD  Past Medical History:  Diagnosis Date   Age-related macular degeneration, dry, both eyes    Allergic    "24/7; 365 days/year; I'm allergic to pollens, dust, all southern grasses/trees, mold, mildue, cats, dogs" (01/07/2017)   Anal fissure    Asthma    sees [name]    Benign prostatic hypertrophy    (sees Dr. Denman George   CAD (coronary artery disease)    a. s/p CABG 2007. b. Cath 08/2016 - 4/4 patent grafts.   Carotid bruit    carotid u/s 10/10: 0.39% bilaterally   Chronic combined systolic and diastolic CHF (congestive heart failure) (HCC)    Complication of anesthesia 1980s   "w/anal cyst OR, he gave me a saddle block then put a narcotic in spinal cord; had a severe reaction to that" (01/07/2017)   Congestive heart failure (CHF) [facility])    ED (erectile dysfunction)    Family history of adverse reaction to anesthesia    "daughter wakes up during OR" (01/07/2017)   GERD (gastroesophageal reflux disease)    Gout    HTN (hypertension)    Hx of colonic polyps    (sees Dr. Henrene Pastor)   Hyperlipidemia    Hypothyroidism    Moderate to severe aortic stenosis    a. s/p TAVR 02/2017.   Myocardial infarction ([place]) ~ 2000   Obesity    Osteoarthritis    "  was in my knees, hands" (01/07/2017 )   PAF (paroxysmal atrial fibrillation) ([place])    a. documented post TAVR.   Precancerous skin lesion    (sees Dr. Allyson Sabal)   Prostate cancer [facility]) dx'd ~ 2014   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   29 mm Edwards Sapien 3 transcatheter heart valve placed via percutaneous right transfemoral approach    Past Surgical History:  Procedure Laterality Date   ANUS SURGERY     "opened it back up cause it wouldn't heal; wound up w/a fissure" (01/07/2017)   BIV ICD  INSERTION CRT-D N/A 05/02/2017   Procedure: BIV ICD INSERTION CRT-D;  Surgeon: Deboraha Sprang, MD;  Location: [place] [facility];  Service: Cardiovascular;  Laterality: N/A;   CARDIAC CATHETERIZATION  10/29/2005   CARDIAC CATHETERIZATION N/A 08/27/2016   Procedure: Right/Left Heart Cath and Coronary/Graft Angiography;  Surgeon: Burnell Blanks, MD;  Location: [place] [facility];  Service: Cardiovascular;  Laterality: N/A;   CATARACT EXTRACTION W/ INTRAOCULAR LENS  IMPLANT, BILATERAL Bilateral    CATARACT EXTRACTION, BILATERAL  2012   COLONOSCOPY  06/30/2008   no repeats needed    COLONOSCOPY     had 3 or 4 in the past    CORONARY ARTERY BYPASS GRAFT  2007   "CABG X4"   CYST EXCISION PERINEAL  1980s   HAMMER TOE SURGERY Bilateral    JOINT REPLACEMENT     KNEE ARTHROPLASTY  07/30/2011   Procedure: COMPUTER ASSISTED TOTAL KNEE ARTHROPLASTY;  Surgeon: Meredith Pel;  Location: [place];  Service: Orthopedics;  Laterality: Left;  left total knee arthroplasty   MASTECTOMY SUBCUTANEOUS Bilateral    MULTIPLE TOOTH EXTRACTIONS     ORIF FINGER / THUMB FRACTURE Right ~ 1980   "repair of thumb injury"   PROSTATE BIOPSY     REPLACEMENT TOTAL KNEE BILATERAL Bilateral 2012   TEE WITHOUT CARDIOVERSION N/A 03/04/2017   Procedure: TRANSESOPHAGEAL ECHOCARDIOGRAM (TEE);  Surgeon: Burnell Blanks, MD;  Location: [place];  Service: Open Heart Surgery;  Laterality: N/A;   TOTAL KNEE REVISION Right 05/18/2019   Procedure: RIGHT PATELLA REVISION/REMOVAL;  Surgeon: Meredith Pel, MD;  Location: [place];  Service: Orthopedics;  Laterality: Right;   TRANSCATHETER AORTIC VALVE REPLACEMENT, TRANSFEMORAL N/A 03/04/2017   Procedure: TRANSCATHETER AORTIC VALVE REPLACEMENT, TRANSFEMORAL;  Surgeon: Burnell Blanks, MD;  Location: [place];  Service: Open Heart Surgery;  Laterality: N/A;    Current Outpatient Medications  Medication Sig Dispense Refill   aspirin 81 MG tablet Take 1 tablet (81 mg total)  by mouth daily. 30 tablet 0   atorvastatin (LIPITOR) 20 MG tablet TAKE 1 TABLET ONCE DAILY. 90 tablet 0   Azelastine HCl 0.15 % SOLN Place 2 sprays into the nose in the morning and at bedtime. 30 mL 11   benzonatate (TESSALON) 200 MG capsule Take 1 capsule (200 mg total) by mouth 2 (two) times daily as needed for cough. 180 capsule 3   colchicine 0.6 MG tablet Take 1 tablet (0.6 mg total) by mouth daily. 30 tablet 0   dabigatran (PRADAXA) 150 MG CAPS capsule TAKE (1) CAPSULE TWICE DAILY. 180 capsule 3   diphenhydrAMINE (BENADRYL) 25 MG tablet Take 25 mg by mouth at bedtime.      ENTRESTO 97-103 MG TAKE 1 TABLET BY MOUTH TWICE DAILY. 180 tablet 2   EPINEPHrine 0.3 mg/0.3 mL IJ SOAJ injection Inject 0.3 mLs (0.3 mg total) into  the muscle as needed for anaphylaxis. 2 Device 1   famotidine (PEPCID) 20 MG tablet Take 1 tablet (20 mg total) by mouth at bedtime. 90 tablet 3   fluorouracil (EFUDEX) 5 % cream Apply 1 application topically 2 (two) times daily as needed (rash).     hydrOXYzine (ATARAX/VISTARIL) 10 MG tablet Take 10 mg by mouth at bedtime.     isosorbide mononitrate (IMDUR) 30 MG 24 hr tablet TAKE 1 TABLET ONCE DAILY. 90 tablet 0   ketotifen (ZADITOR) 0.025 % ophthalmic solution Apply 1 drop to eye daily as needed (itchy eyes).      levocetirizine (XYZAL) 5 MG tablet Take 5 mg by mouth daily.     levothyroxine (SYNTHROID) 100 MCG tablet Take 1 tablet (100 mcg total) by mouth daily. 90 tablet 3   meclizine (ANTIVERT) 25 MG tablet Take 1 tablet (25 mg total) by mouth every 4 (four) hours as needed for dizziness. 60 tablet 2   methocarbamol (ROBAXIN) 500 MG tablet Take 1 tablet (500 mg total) by mouth every 8 (eight) hours as needed for muscle spasms. 30 tablet 0   metoCLOPramide (REGLAN) 10 MG tablet Take 1 tablet every morning with breakfast 30 tablet 2   metolazone (ZAROXOLYN) 2.5 MG tablet Take 1 tablet by mouth 3 days per week 36 tablet 3   metoprolol succinate (TOPROL-XL) 50 MG 24 hr  tablet Take 1.5 tablets (75 mg total) by mouth daily. Take with or immediately following a meal. 135 tablet 1   montelukast (SINGULAIR) 10 MG tablet TAKE 1 TABLET ONCE DAILY IN THE EVENING. 90 tablet 3   Multiple Vitamins-Minerals (PRESERVISION AREDS PO) Take 2 tablets by mouth every morning.     omeprazole (PRILOSEC) 20 MG capsule TAKE (1) CAPSULE TWICE DAILY. 180 capsule 3   oxyCODONE (OXY IR/ROXICODONE) 5 MG immediate release tablet Take 1 tablet (5 mg total) by mouth every 12 (twelve) hours as needed for severe pain. 30 tablet 0   potassium bicarbonate (K-LYTE) 25 MEQ disintegrating tablet Take 25 mEq by mouth 2 (two) times daily.     spironolactone (ALDACTONE) 25 MG tablet TAKE 1 TABLET ONCE DAILY. 90 tablet 1   triamcinolone cream (KENALOG) 0.1 % Apply 1 application topically daily as needed for dry skin.     valACYclovir (VALTREX) 500 MG tablet Take 500 mg by mouth 2 (two) times daily as needed (cold sores).     furosemide (LASIX) 80 MG tablet Take 1 tablet (80 mg total) by mouth daily. 90 tablet 3   potassium chloride SA (KLOR-CON) 20 MEQ tablet Take 2 tablets (40 mEq total) by mouth daily. Take 2 tablets daily alternating with 1 tablet every other day 180 tablet 3   No current facility-administered medications for this visit.    Allergies  Allergen Reactions   Peanut-Containing Drug Products Anaphylaxis   Sulfonamide Derivatives Anaphylaxis   Amlodipine Swelling    Swelling in ankles   Eliquis [Apixaban] Other (See Comments)    Back/hip pain   Lisinopril Cough    cough   Xarelto [Rivaroxaban] Other (See Comments)    Back/hip pain    Social History   Socioeconomic History   Marital status: Married    Spouse name: Not on file   Number of children: 3   Years of education: Not on file   Highest education level: Not on file  Occupational History   Occupation: Clinical biochemist    Comment: builds malls  Tobacco Use   Smoking status: Former  Packs/day: 3.50    Years:  13.00    Pack years: 45.50    Types: Cigarettes    Quit date: 1963    Years since quitting: 59.6   Smokeless tobacco: Never   Tobacco comments:    Former smoker 04/19/21  Vaping Use   Vaping Use: Never used  Substance and Sexual Activity   Alcohol use: Not Currently    Alcohol/week: 7.0 standard drinks    Types: 7 Cans of beer per week    Comment: 1 beer daily after 5pm (04/19/21)   Drug use: No   Sexual activity: Never  Other Topics Concern   Not on file  Social History Narrative   FH of CAD, Male 1st degree relative less than age 23.   Social Determinants of Health   Financial Resource Strain: Low Risk    Difficulty of Paying Living Expenses: Not very hard  Food Insecurity: No Food Insecurity   Worried About Charity fundraiser in the Last Year: Never true   Ran Out of Food in the Last Year: Never true  Transportation Needs: No Transportation Needs   Lack of Transportation (Medical): No   Lack of Transportation (Non-Medical): No  Physical Activity: Insufficiently Active   Days of Exercise per Week: 4 days   Minutes of Exercise per Session: 30 min  Stress: No Stress Concern Present   Feeling of Stress : Not at all  Social Connections: Moderately Isolated   Frequency of Communication with Friends and Family: Three times a week   Frequency of Social Gatherings with Friends and Family: Three times a week   Attends Religious Services: Never   Active Member of Clubs or Organizations: No   Attends Music therapist: Never   Marital Status: Married  Human resources officer Violence: Not At Risk   Fear of Current or Ex-Partner: No   Emotionally Abused: No   Physically Abused: No   Sexually Abused: No    Family History  Problem Relation Age of Onset   Heart attack Father 38   Allergic rhinitis Father    Asthma Father    Heart failure Mother 82   Uterine cancer Mother    Breast cancer Mother    Colon cancer Neg Hx    Esophageal cancer Neg Hx     Review of  Systems:  As stated in the HPI and otherwise negative.   BP 108/60   Pulse (!) 56   Ht '5\' 6"'$  (1.676 m)   Wt 280 lb 9.6 oz (127.3 kg)   SpO2 97%   BMI 45.29 kg/m   Physical Examination: General: Well developed, well nourished, NAD  HEENT: OP clear, mucus membranes moist  SKIN: warm, dry. No rashes. Neuro: No focal deficits  Musculoskeletal: Muscle strength 5/5 all ext  Psychiatric: Mood and affect normal  Neck: No JVD, no carotid bruits, no thyromegaly, no lymphadenopathy.  Lungs:Mild basilar crackles.  Cardiovascular: Regular rate and rhythm. No murmurs, gallops or rubs. Abdomen:Soft. Bowel sounds present. Non-tender.  Extremities: 1+ lower extremity edema. Pulses are 2 + in the bilateral DP/PT.  Echo September 2020:   1. The left ventricle has moderately reduced systolic function, with an ejection fraction of 35-40%. The cavity size was normal. There is moderately increased left ventricular wall thickness. Left ventricular diastolic Doppler parameters are consistent  with impaired relaxation. Indeterminate filling pressures.  2. Severe hypokinesis of the left ventricular, entire anterior wall, anteroseptal wall and apical segment.  3. The right ventricle has moderately  reduced systolic function. The cavity was normal. There is no increase in right ventricular wall thickness.  4. Left atrial size was mildly dilated.  5. Right atrial size was mildly dilated.  6. The mitral valve is abnormal. Mild thickening of the mitral valve leaflet. There is mild to moderate mitral annular calcification present.  7. The tricuspid valve is grossly normal.  8. A 45m an EWende CreaseSapien bioprosthetic aortic valve (TAVR) valve is present in the aortic position. Procedure Date: 03/13/2019 Normal aortic valve prosthesis.  9. No stenosis of the aortic valve. 10. The aorta is abnormal unless otherwise noted. 11. There is mild dilatation of the ascending aorta measuring 40 mm. 12. When compared to  the prior study: 02/06/2018: LVEF 40-45%, TAVR mean gradient 9 mmHg, aorta 3.9 cm.    Cardiac cath 08/27/16: Diagnostic Diagram          EKG:  EKG is not ordered today The ekg ordered today demonstrates    Recent Labs: 03/28/2021: ALT 16; Pro B Natriuretic peptide (BNP) 354.0; TSH 5.39 04/19/2021: BUN 25; Creatinine, Ser 1.07; Hemoglobin 13.0; Platelets 241; Potassium 4.8; Sodium 135   Lipid Panel    Component Value Date/Time   CHOL 124 03/28/2021 0730   CHOL 171 08/11/2018 0735   TRIG 161.0 (H) 03/28/2021 0730   TRIG 143 06/30/2006 1132   HDL 34.60 (L) 03/28/2021 0730   HDL 34 (L) 08/11/2018 0735   CHOLHDL 4 03/28/2021 0730   VLDL 32.2 03/28/2021 0730   LDLCALC 57 03/28/2021 0730   LDLCALC 68 03/21/2020 0943     Wt Readings from Last 3 Encounters:  04/20/21 280 lb 9.6 oz (127.3 kg)  04/19/21 281 lb 12.8 oz (127.8 kg)  03/27/21 275 lb (124.7 kg)     Other studies Reviewed: Additional studies/ records that were reviewed today include:  Review of the above records demonstrates:    Assessment and Plan:   1. CAD s/p CABG without angina: He has no chest pain. Will plan to continue ASA, beta blocker and statin.       2. Aortic valve stenosis: He is s/p TAVR in July 2018. Will continue SBE prophylaxis as indicated. He is on Pradaxa.   3. HTN: BP is controlled. No changes  4. HLD: LDL at goal in August 2022. Continue statin   5. Acute on Chronic systolic and diastolic CHF: He is up 6 lbs over the past week. Will continue Lasix 80 mg daily and KDur 40 meq daily. Will continue metolazone 2.5 mg three days per week. BMET from yesterday  reviewed. Renal function is normal.   6. Atrial fibrillation, paroxysmal: Atrial fib on exam today. Will continue beta blocker and Pradaxa.  7. Ischemic cardiomyopathy: ICD in place. Continue Toprol and Entresto.   8. Carotid artery disease:  Mild bilateral carotid artery disease by dopplers March 2021.   Current medicines are reviewed at  length with the patient today.  The patient does not have concerns regarding medicines.  The following changes have been made:  no change  Labs/ tests ordered today include:   No orders of the defined types were placed in this encounter.  Disposition:  Follow up with me in 12 months.   Signed, CLauree Chandler MD 04/20/2021 10:34 AM    [facility] [address] [place]  [id number]: [phone] Fax: [phone]

## 2021-04-20 NOTE — Patient Instructions (Signed)
Medication Instructions:  Your physician has recommended you make the following change in your medication:  INCREASE the Lasix to 80 mg taking 1 tablet daily  INCREASE the Potassium to 20 mg taking 2 tablets daily  INCREASE the Metolazone to 2.5 mg taking 1 tablet three days a week  *If you need a refill on your cardiac medications before your next appointment, please call your pharmacy*   Lab Work: None ordered  If you have labs (blood work) drawn today and your tests are completely normal, you will receive your results only by: [place] (if you have MyChart) OR A paper copy in the mail If you have any lab test that is abnormal or we need to change your treatment, we will call you to review the results.   Testing/Procedures: None ordered   Follow-Up: At [facility], you and your health needs are our priority.  As part of our continuing mission to provide you with exceptional heart care, we have created designated Provider Care Teams.  These Care Teams include your primary Cardiologist (physician) and Advanced Practice Providers (APPs -  Physician Assistants and Nurse Practitioners) who all work together to provide you with the care you need, when you need it.  We recommend signing up for the patient portal called "MyChart".  Sign up information is provided on this After Visit Summary.  MyChart is used to connect with patients for Virtual Visits (Telemedicine).  Patients are able to view lab/test results, encounter notes, upcoming appointments, etc.  Non-urgent messages can be sent to your provider as well.   To learn more about what you can do with MyChart, go to [email or web address].    Your next appointment:   Keep your scheduled appointments   The format for your next appointment:     Provider:      Other Instructions

## 2021-04-20 NOTE — Progress Notes (Signed)
Chief Complaint  Patient presents with   Follow-up    Dyspnea, CHF    History of Present Illness: 87 yo male with history of HTN, HLD, severe aortic stenosis s/p TAVR, PAF, chronic diastolic and systolic CHF, CAD s/p 4V CABG, syncope, junctional bradycardia, PVCs, non-sustained VT and LBBB s/p ICD implantation who is here today for cardiac follow up. He is known to have CAD s/p 4V CABG in March 2007 (LIMA to LAD, SVG to Diagonal, SVG to OM, SVG to RCA). Last cardiac cath in January 2018 with 4/4 patent bypass grafts. He had progression of his aortic stenosis with worsened dyspnea and acute on chronic CHF in the spring of 2018. Echo May 2018 with mildly reduced LV systolic function, [id number]. His aortic stenosis was severe. He underwent TAVR on 03/04/17 with a 29 mm Edwards Sapien 3 valve from the right transfemoral approach.  He developed atrial fibrillation following his procedure and was placed on IV amiodarone with conversion to sinus rhythm. Follow up visit in our office 03/19/17 and pt noted to have irregular rhythm with intraventricular conduction delay and was felt by our EP team to represent 2:1 conduction of competing fast and slow pathways. Cardiac monitor with sinus rhythm with periods of junctional rhythm, frequent PVCs, frequent PACs and runs of a wide complex tachycardia. ICD was implanted on 05/03/17. Echo September 2020 with LVEF=35-40%. He has since been changed from Losartan to [place]. Carotid artery dopplers March 2021 with mld bilateral carotid artery disease. He has had trouble with fluid retention over the the past two months. Lasix was increased to 80 mg daily and metolazone 2.5 mg added two days per week. Recent Optival readings suggest normal fluid levels/normal thoracic impedence. Device interrogation with persistent atrial fibrillation since 04/05/21. He was seen in the atrial fib clinic 04/19/21 and plans for cardioversion next week.   He is here today for follow up. The  patient denies any chest pain, palpitations, dizziness, near syncope or syncope. He endorses ongoing dyspnea, LE edema but denies orthopnea or PND.    Primary Care Physician: Laurey Morale, MD  Past Medical History:  Diagnosis Date   Age-related macular degeneration, dry, both eyes    Allergic    "24/7; 365 days/year; I'm allergic to pollens, dust, all southern grasses/trees, mold, mildue, cats, dogs" (01/07/2017)   Anal fissure    Asthma    sees [name]    Benign prostatic hypertrophy    (sees Dr. Denman George   CAD (coronary artery disease)    a. s/p CABG 2007. b. Cath 08/2016 - 4/4 patent grafts.   Carotid bruit    carotid u/s 10/10: 0.39% bilaterally   Chronic combined systolic and diastolic CHF (congestive heart failure) (HCC)    Complication of anesthesia 1980s   "w/anal cyst OR, he gave me a saddle block then put a narcotic in spinal cord; had a severe reaction to that" (01/07/2017)   Congestive heart failure (CHF) [facility])    ED (erectile dysfunction)    Family history of adverse reaction to anesthesia    "daughter wakes up during OR" (01/07/2017)   GERD (gastroesophageal reflux disease)    Gout    HTN (hypertension)    Hx of colonic polyps    (sees Dr. Henrene Pastor)   Hyperlipidemia    Hypothyroidism    Moderate to severe aortic stenosis    a. s/p TAVR 02/2017.   Myocardial infarction ([place]) ~ 2000   Obesity    Osteoarthritis    "  was in my knees, hands" (01/07/2017 )   PAF (paroxysmal atrial fibrillation) ([place])    a. documented post TAVR.   Precancerous skin lesion    (sees Dr. Allyson Sabal)   Prostate cancer [facility]) dx'd ~ 2014   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   29 mm Edwards Sapien 3 transcatheter heart valve placed via percutaneous right transfemoral approach    Past Surgical History:  Procedure Laterality Date   ANUS SURGERY     "opened it back up cause it wouldn't heal; wound up w/a fissure" (01/07/2017)   BIV ICD  INSERTION CRT-D N/A 05/02/2017   Procedure: BIV ICD INSERTION CRT-D;  Surgeon: Deboraha Sprang, MD;  Location: [place] [facility];  Service: Cardiovascular;  Laterality: N/A;   CARDIAC CATHETERIZATION  10/29/2005   CARDIAC CATHETERIZATION N/A 08/27/2016   Procedure: Right/Left Heart Cath and Coronary/Graft Angiography;  Surgeon: Burnell Blanks, MD;  Location: [place] [facility];  Service: Cardiovascular;  Laterality: N/A;   CATARACT EXTRACTION W/ INTRAOCULAR LENS  IMPLANT, BILATERAL Bilateral    CATARACT EXTRACTION, BILATERAL  2012   COLONOSCOPY  06/30/2008   no repeats needed    COLONOSCOPY     had 3 or 4 in the past    CORONARY ARTERY BYPASS GRAFT  2007   "CABG X4"   CYST EXCISION PERINEAL  1980s   HAMMER TOE SURGERY Bilateral    JOINT REPLACEMENT     KNEE ARTHROPLASTY  07/30/2011   Procedure: COMPUTER ASSISTED TOTAL KNEE ARTHROPLASTY;  Surgeon: Meredith Pel;  Location: [place];  Service: Orthopedics;  Laterality: Left;  left total knee arthroplasty   MASTECTOMY SUBCUTANEOUS Bilateral    MULTIPLE TOOTH EXTRACTIONS     ORIF FINGER / THUMB FRACTURE Right ~ 1980   "repair of thumb injury"   PROSTATE BIOPSY     REPLACEMENT TOTAL KNEE BILATERAL Bilateral 2012   TEE WITHOUT CARDIOVERSION N/A 03/04/2017   Procedure: TRANSESOPHAGEAL ECHOCARDIOGRAM (TEE);  Surgeon: Burnell Blanks, MD;  Location: [place];  Service: Open Heart Surgery;  Laterality: N/A;   TOTAL KNEE REVISION Right 05/18/2019   Procedure: RIGHT PATELLA REVISION/REMOVAL;  Surgeon: Meredith Pel, MD;  Location: [place];  Service: Orthopedics;  Laterality: Right;   TRANSCATHETER AORTIC VALVE REPLACEMENT, TRANSFEMORAL N/A 03/04/2017   Procedure: TRANSCATHETER AORTIC VALVE REPLACEMENT, TRANSFEMORAL;  Surgeon: Burnell Blanks, MD;  Location: [place];  Service: Open Heart Surgery;  Laterality: N/A;    Current Outpatient Medications  Medication Sig Dispense Refill   aspirin 81 MG tablet Take 1 tablet (81 mg total)  by mouth daily. 30 tablet 0   atorvastatin (LIPITOR) 20 MG tablet TAKE 1 TABLET ONCE DAILY. 90 tablet 0   Azelastine HCl 0.15 % SOLN Place 2 sprays into the nose in the morning and at bedtime. 30 mL 11   benzonatate (TESSALON) 200 MG capsule Take 1 capsule (200 mg total) by mouth 2 (two) times daily as needed for cough. 180 capsule 3   colchicine 0.6 MG tablet Take 1 tablet (0.6 mg total) by mouth daily. 30 tablet 0   dabigatran (PRADAXA) 150 MG CAPS capsule TAKE (1) CAPSULE TWICE DAILY. 180 capsule 3   diphenhydrAMINE (BENADRYL) 25 MG tablet Take 25 mg by mouth at bedtime.      ENTRESTO 97-103 MG TAKE 1 TABLET BY MOUTH TWICE DAILY. 180 tablet 2   EPINEPHrine 0.3 mg/0.3 mL IJ SOAJ injection Inject 0.3 mLs (0.3 mg total) into  the muscle as needed for anaphylaxis. 2 Device 1   famotidine (PEPCID) 20 MG tablet Take 1 tablet (20 mg total) by mouth at bedtime. 90 tablet 3   fluorouracil (EFUDEX) 5 % cream Apply 1 application topically 2 (two) times daily as needed (rash).     hydrOXYzine (ATARAX/VISTARIL) 10 MG tablet Take 10 mg by mouth at bedtime.     isosorbide mononitrate (IMDUR) 30 MG 24 hr tablet TAKE 1 TABLET ONCE DAILY. 90 tablet 0   ketotifen (ZADITOR) 0.025 % ophthalmic solution Apply 1 drop to eye daily as needed (itchy eyes).      levocetirizine (XYZAL) 5 MG tablet Take 5 mg by mouth daily.     levothyroxine (SYNTHROID) 100 MCG tablet Take 1 tablet (100 mcg total) by mouth daily. 90 tablet 3   meclizine (ANTIVERT) 25 MG tablet Take 1 tablet (25 mg total) by mouth every 4 (four) hours as needed for dizziness. 60 tablet 2   methocarbamol (ROBAXIN) 500 MG tablet Take 1 tablet (500 mg total) by mouth every 8 (eight) hours as needed for muscle spasms. 30 tablet 0   metoCLOPramide (REGLAN) 10 MG tablet Take 1 tablet every morning with breakfast 30 tablet 2   metolazone (ZAROXOLYN) 2.5 MG tablet Take 1 tablet by mouth 3 days per week 36 tablet 3   metoprolol succinate (TOPROL-XL) 50 MG 24 hr  tablet Take 1.5 tablets (75 mg total) by mouth daily. Take with or immediately following a meal. 135 tablet 1   montelukast (SINGULAIR) 10 MG tablet TAKE 1 TABLET ONCE DAILY IN THE EVENING. 90 tablet 3   Multiple Vitamins-Minerals (PRESERVISION AREDS PO) Take 2 tablets by mouth every morning.     omeprazole (PRILOSEC) 20 MG capsule TAKE (1) CAPSULE TWICE DAILY. 180 capsule 3   oxyCODONE (OXY IR/ROXICODONE) 5 MG immediate release tablet Take 1 tablet (5 mg total) by mouth every 12 (twelve) hours as needed for severe pain. 30 tablet 0   potassium bicarbonate (K-LYTE) 25 MEQ disintegrating tablet Take 25 mEq by mouth 2 (two) times daily.     spironolactone (ALDACTONE) 25 MG tablet TAKE 1 TABLET ONCE DAILY. 90 tablet 1   triamcinolone cream (KENALOG) 0.1 % Apply 1 application topically daily as needed for dry skin.     valACYclovir (VALTREX) 500 MG tablet Take 500 mg by mouth 2 (two) times daily as needed (cold sores).     furosemide (LASIX) 80 MG tablet Take 1 tablet (80 mg total) by mouth daily. 90 tablet 3   potassium chloride SA (KLOR-CON) 20 MEQ tablet Take 2 tablets (40 mEq total) by mouth daily. Take 2 tablets daily alternating with 1 tablet every other day 180 tablet 3   No current facility-administered medications for this visit.    Allergies  Allergen Reactions   Peanut-Containing Drug Products Anaphylaxis   Sulfonamide Derivatives Anaphylaxis   Amlodipine Swelling    Swelling in ankles   Eliquis [Apixaban] Other (See Comments)    Back/hip pain   Lisinopril Cough    cough   Xarelto [Rivaroxaban] Other (See Comments)    Back/hip pain    Social History   Socioeconomic History   Marital status: Married    Spouse name: Not on file   Number of children: 3   Years of education: Not on file   Highest education level: Not on file  Occupational History   Occupation: Clinical biochemist    Comment: builds malls  Tobacco Use   Smoking status: Former  Packs/day: 3.50    Years:  13.00    Pack years: 45.50    Types: Cigarettes    Quit date: 1963    Years since quitting: 59.6   Smokeless tobacco: Never   Tobacco comments:    Former smoker 04/19/21  Vaping Use   Vaping Use: Never used  Substance and Sexual Activity   Alcohol use: Not Currently    Alcohol/week: 7.0 standard drinks    Types: 7 Cans of beer per week    Comment: 1 beer daily after 5pm (04/19/21)   Drug use: No   Sexual activity: Never  Other Topics Concern   Not on file  Social History Narrative   FH of CAD, Male 1st degree relative less than age 66.   Social Determinants of Health   Financial Resource Strain: Low Risk    Difficulty of Paying Living Expenses: Not very hard  Food Insecurity: No Food Insecurity   Worried About Charity fundraiser in the Last Year: Never true   Ran Out of Food in the Last Year: Never true  Transportation Needs: No Transportation Needs   Lack of Transportation (Medical): No   Lack of Transportation (Non-Medical): No  Physical Activity: Insufficiently Active   Days of Exercise per Week: 4 days   Minutes of Exercise per Session: 30 min  Stress: No Stress Concern Present   Feeling of Stress : Not at all  Social Connections: Moderately Isolated   Frequency of Communication with Friends and Family: Three times a week   Frequency of Social Gatherings with Friends and Family: Three times a week   Attends Religious Services: Never   Active Member of Clubs or Organizations: No   Attends Music therapist: Never   Marital Status: Married  Human resources officer Violence: Not At Risk   Fear of Current or Ex-Partner: No   Emotionally Abused: No   Physically Abused: No   Sexually Abused: No    Family History  Problem Relation Age of Onset   Heart attack Father 30   Allergic rhinitis Father    Asthma Father    Heart failure Mother 10   Uterine cancer Mother    Breast cancer Mother    Colon cancer Neg Hx    Esophageal cancer Neg Hx     Review of  Systems:  As stated in the HPI and otherwise negative.   BP 108/60   Pulse (!) 56   Ht '5\' 6"'$  (1.676 m)   Wt 280 lb 9.6 oz (127.3 kg)   SpO2 97%   BMI 45.29 kg/m   Physical Examination: General: Well developed, well nourished, NAD  HEENT: OP clear, mucus membranes moist  SKIN: warm, dry. No rashes. Neuro: No focal deficits  Musculoskeletal: Muscle strength 5/5 all ext  Psychiatric: Mood and affect normal  Neck: No JVD, no carotid bruits, no thyromegaly, no lymphadenopathy.  Lungs:Mild basilar crackles.  Cardiovascular: Regular rate and rhythm. No murmurs, gallops or rubs. Abdomen:Soft. Bowel sounds present. Non-tender.  Extremities: 1+ lower extremity edema. Pulses are 2 + in the bilateral DP/PT.  Echo September 2020:   1. The left ventricle has moderately reduced systolic function, with an ejection fraction of 35-40%. The cavity size was normal. There is moderately increased left ventricular wall thickness. Left ventricular diastolic Doppler parameters are consistent  with impaired relaxation. Indeterminate filling pressures.  2. Severe hypokinesis of the left ventricular, entire anterior wall, anteroseptal wall and apical segment.  3. The right ventricle has moderately  reduced systolic function. The cavity was normal. There is no increase in right ventricular wall thickness.  4. Left atrial size was mildly dilated.  5. Right atrial size was mildly dilated.  6. The mitral valve is abnormal. Mild thickening of the mitral valve leaflet. There is mild to moderate mitral annular calcification present.  7. The tricuspid valve is grossly normal.  8. A 2m an EWende CreaseSapien bioprosthetic aortic valve (TAVR) valve is present in the aortic position. Procedure Date: 03/13/2019 Normal aortic valve prosthesis.  9. No stenosis of the aortic valve. 10. The aorta is abnormal unless otherwise noted. 11. There is mild dilatation of the ascending aorta measuring 40 mm. 12. When compared to  the prior study: 02/06/2018: LVEF 40-45%, TAVR mean gradient 9 mmHg, aorta 3.9 cm.    Cardiac cath 08/27/16: Diagnostic Diagram          EKG:  EKG is not ordered today The ekg ordered today demonstrates    Recent Labs: 03/28/2021: ALT 16; Pro B Natriuretic peptide (BNP) 354.0; TSH 5.39 04/19/2021: BUN 25; Creatinine, Ser 1.07; Hemoglobin 13.0; Platelets 241; Potassium 4.8; Sodium 135   Lipid Panel    Component Value Date/Time   CHOL 124 03/28/2021 0730   CHOL 171 08/11/2018 0735   TRIG 161.0 (H) 03/28/2021 0730   TRIG 143 06/30/2006 1132   HDL 34.60 (L) 03/28/2021 0730   HDL 34 (L) 08/11/2018 0735   CHOLHDL 4 03/28/2021 0730   VLDL 32.2 03/28/2021 0730   LDLCALC 57 03/28/2021 0730   LDLCALC 68 03/21/2020 0943     Wt Readings from Last 3 Encounters:  04/20/21 280 lb 9.6 oz (127.3 kg)  04/19/21 281 lb 12.8 oz (127.8 kg)  03/27/21 275 lb (124.7 kg)     Other studies Reviewed: Additional studies/ records that were reviewed today include:  Review of the above records demonstrates:    Assessment and Plan:   1. CAD s/p CABG without angina: He has no chest pain. Will plan to continue ASA, beta blocker and statin.       2. Aortic valve stenosis: He is s/p TAVR in July 2018. Will continue SBE prophylaxis as indicated. He is on Pradaxa.   3. HTN: BP is controlled. No changes  4. HLD: LDL at goal in August 2022. Continue statin   5. Acute on Chronic systolic and diastolic CHF: He is up 6 lbs over the past week. Will continue Lasix 80 mg daily and KDur 40 meq daily. Will continue metolazone 2.5 mg three days per week. BMET from yesterday  reviewed. Renal function is normal.   6. Atrial fibrillation, paroxysmal: Atrial fib on exam today. Will continue beta blocker and Pradaxa.  7. Ischemic cardiomyopathy: ICD in place. Continue Toprol and Entresto.   8. Carotid artery disease:  Mild bilateral carotid artery disease by dopplers March 2021.   Current medicines are reviewed at  length with the patient today.  The patient does not have concerns regarding medicines.  The following changes have been made:  no change  Labs/ tests ordered today include:   No orders of the defined types were placed in this encounter.  Disposition:  Follow up with me in 12 months.   Signed, CLauree Chandler MD 04/20/2021 10:34 AM    [facility] [address] [place]  [address]Phone: [phone] Fax: [phone]

## 2021-04-24 ENCOUNTER — Ambulatory Visit (HOSPITAL_COMMUNITY): Payer: Medicare Other | Admitting: Anesthesiology

## 2021-04-24 ENCOUNTER — Ambulatory Visit (HOSPITAL_COMMUNITY)
Admission: RE | Admit: 2021-04-24 | Discharge: 2021-04-24 | Disposition: A | Discharge status: Home / Self Care | Payer: Medicare Other | Attending: Internal Medicine | Admitting: Internal Medicine

## 2021-04-24 ENCOUNTER — Encounter (HOSPITAL_COMMUNITY)
Admission: RE | Disposition: A | Discharge status: Home / Self Care | Payer: Self-pay | Source: Home / Self Care | Attending: Internal Medicine

## 2021-04-24 ENCOUNTER — Encounter (HOSPITAL_COMMUNITY): Payer: Self-pay | Admitting: Internal Medicine

## 2021-04-24 ENCOUNTER — Other Ambulatory Visit: Payer: Self-pay

## 2021-04-24 DIAGNOSIS — I110 Hypertensive heart disease with heart failure: Secondary | ICD-10-CM | POA: Insufficient documentation

## 2021-04-24 DIAGNOSIS — Z79899 Other long term (current) drug therapy: Secondary | ICD-10-CM | POA: Insufficient documentation

## 2021-04-24 DIAGNOSIS — Z951 Presence of aortocoronary bypass graft: Secondary | ICD-10-CM | POA: Insufficient documentation

## 2021-04-24 DIAGNOSIS — I5042 Chronic combined systolic (congestive) and diastolic (congestive) heart failure: Secondary | ICD-10-CM | POA: Diagnosis not present

## 2021-04-24 DIAGNOSIS — Z8049 Family history of malignant neoplasm of other genital organs: Secondary | ICD-10-CM | POA: Insufficient documentation

## 2021-04-24 DIAGNOSIS — Z888 Allergy status to other drugs, medicaments and biological substances status: Secondary | ICD-10-CM | POA: Insufficient documentation

## 2021-04-24 DIAGNOSIS — Z91010 Allergy to peanuts: Secondary | ICD-10-CM | POA: Diagnosis not present

## 2021-04-24 DIAGNOSIS — I252 Old myocardial infarction: Secondary | ICD-10-CM | POA: Diagnosis not present

## 2021-04-24 DIAGNOSIS — Z825 Family history of asthma and other chronic lower respiratory diseases: Secondary | ICD-10-CM | POA: Diagnosis not present

## 2021-04-24 DIAGNOSIS — Z8249 Family history of ischemic heart disease and other diseases of the circulatory system: Secondary | ICD-10-CM | POA: Diagnosis not present

## 2021-04-24 DIAGNOSIS — I447 Left bundle-branch block, unspecified: Secondary | ICD-10-CM | POA: Insufficient documentation

## 2021-04-24 DIAGNOSIS — Z803 Family history of malignant neoplasm of breast: Secondary | ICD-10-CM | POA: Diagnosis not present

## 2021-04-24 DIAGNOSIS — Z882 Allergy status to sulfonamides status: Secondary | ICD-10-CM | POA: Diagnosis not present

## 2021-04-24 DIAGNOSIS — I4819 Other persistent atrial fibrillation: Secondary | ICD-10-CM | POA: Diagnosis not present

## 2021-04-24 DIAGNOSIS — Z95810 Presence of automatic (implantable) cardiac defibrillator: Secondary | ICD-10-CM | POA: Diagnosis not present

## 2021-04-24 DIAGNOSIS — I480 Paroxysmal atrial fibrillation: Secondary | ICD-10-CM | POA: Diagnosis not present

## 2021-04-24 DIAGNOSIS — I2510 Atherosclerotic heart disease of native coronary artery without angina pectoris: Secondary | ICD-10-CM | POA: Insufficient documentation

## 2021-04-24 DIAGNOSIS — Z87891 Personal history of nicotine dependence: Secondary | ICD-10-CM | POA: Insufficient documentation

## 2021-04-24 DIAGNOSIS — I350 Nonrheumatic aortic (valve) stenosis: Secondary | ICD-10-CM | POA: Insufficient documentation

## 2021-04-24 DIAGNOSIS — I5043 Acute on chronic combined systolic (congestive) and diastolic (congestive) heart failure: Secondary | ICD-10-CM | POA: Diagnosis not present

## 2021-04-24 DIAGNOSIS — Z79890 Hormone replacement therapy (postmenopausal): Secondary | ICD-10-CM | POA: Insufficient documentation

## 2021-04-24 DIAGNOSIS — I4891 Unspecified atrial fibrillation: Secondary | ICD-10-CM

## 2021-04-24 HISTORY — PX: CARDIOVERSION: SHX1299

## 2021-04-24 SURGERY — CARDIOVERSION
Anesthesia: General

## 2021-04-24 MED ORDER — PHENYLEPHRINE HCL (PRESSORS) 10 MG/ML IV SOLN
INTRAVENOUS | Status: DC | PRN
  Administered 2021-04-24: 80 ug via INTRAVENOUS
  Administered 2021-04-24: 40 ug via INTRAVENOUS

## 2021-04-24 MED ORDER — PROPOFOL 10 MG/ML IV BOLUS
INTRAVENOUS | Status: DC | PRN
  Administered 2021-04-24: 40 mg via INTRAVENOUS

## 2021-04-24 MED ORDER — SODIUM CHLORIDE 0.9 % IV SOLN
INTRAVENOUS | Status: DC
  Administered 2021-04-24: 500 mL via INTRAVENOUS

## 2021-04-24 MED ORDER — LIDOCAINE 2% (20 MG/ML) 5 ML SYRINGE
INTRAMUSCULAR | Status: DC | PRN
  Administered 2021-04-24: 80 mg via INTRAVENOUS

## 2021-04-24 MED ORDER — SODIUM CHLORIDE 0.9 % IV SOLN: INTRAVENOUS | Status: DC | PRN

## 2021-04-24 NOTE — CV Procedure (Signed)
CARDIOVERSION  Indication:  Atrial fibrillation  Patient anestheized by anesthesia with Propofol intravenously  WIth pads in apex/base position, patient cardioverted to SR with 200 J synchronized biphasic energy  Procedure was without complication  ICD interrogated by [employer] prior to discharge   [name] MD

## 2021-04-24 NOTE — Anesthesia Preprocedure Evaluation (Signed)
Anesthesia Evaluation  Patient identified by MRN, date of birth, ID band Patient awake    Reviewed: Allergy & Precautions, NPO status , Patient's Chart, lab work & pertinent test results, reviewed documented beta blocker date and time   History of Anesthesia Complications Negative for: history of anesthetic complications  Airway Mallampati: II  TM Distance: >3 FB Neck ROM: Full    Dental  (+) Dental Advisory Given   Pulmonary asthma , COPD, former smoker,    breath sounds clear to auscultation       Cardiovascular hypertension, Pt. on medications and Pt. on home beta blockers (-) angina+ CAD, + Past MI, + CABG and +CHF (entresto)  + dysrhythmias Atrial Fibrillation + Valvular Problems/Murmurs (s/p TAVR)  Rhythm:Irregular Rate:Normal  '20 ECHO: The left ventricle has moderately reduced systolic function, EF [id number]. moderately increased left ventricular wall thickness. Severe hypokinesis of the left ventricular, entire anterior wall,  anteroseptal wall and apical segment. The right ventricle has moderately reduced systolic function. S/p TAVR   Neuro/Psych negative neurological ROS     GI/Hepatic Neg liver ROS, GERD  Medicated and Controlled,  Endo/Other  Hypothyroidism Morbid obesity  Renal/GU negative Renal ROS     Musculoskeletal  (+) Arthritis ,   Abdominal (+) + obese,   Peds  Hematology Pradaxa   Anesthesia Other Findings   Reproductive/Obstetrics                             Anesthesia Physical Anesthesia Plan  ASA: 4  Anesthesia Plan: General   Post-op Pain Management:    Induction: Intravenous  PONV Risk Score and Plan: 2 and Treatment may vary due to age or medical condition  Airway Management Planned: Natural Airway and Mask  Additional Equipment: None  Intra-op Plan:   Post-operative Plan:   Informed Consent: I have reviewed the patients History and Physical, chart,  labs and discussed the procedure including the risks, benefits and alternatives for the proposed anesthesia with the patient or authorized representative who has indicated his/her understanding and acceptance.     Dental advisory given  Plan Discussed with: CRNA and Surgeon  Anesthesia Plan Comments:         Anesthesia Quick Evaluation

## 2021-04-24 NOTE — Anesthesia Procedure Notes (Signed)
Procedure Name: General with mask airway Date/Time: 04/24/2021 10:48 AM Performed by: Kathryne Hitch, CRNA Pre-anesthesia Checklist: Patient identified, Emergency Drugs available, Suction available and Patient being monitored Patient Re-evaluated:Patient Re-evaluated prior to induction Oxygen Delivery Method: Simple face mask Preoxygenation: Pre-oxygenation with 100% oxygen Induction Type: IV induction Dental Injury: Teeth and Oropharynx as per pre-operative assessment

## 2021-04-24 NOTE — Interval H&P Note (Signed)
History and Physical Interval Note:  04/24/2021 10:22 AM  Justin [name]  has presented today for surgery, with the diagnosis of AFIB.  The various methods of treatment have been discussed with the patient and family. After consideration of risks, benefits and other options for treatment, the patient has consented to  Procedure(s): CARDIOVERSION (N/A) as a surgical intervention.  The patient's history has been reviewed, patient examined, no change in status, stable for surgery.  I have reviewed the patient's chart and labs.  Questions were answered to the patient's satisfaction.     [name]

## 2021-04-24 NOTE — Anesthesia Postprocedure Evaluation (Signed)
Anesthesia Post Note  Patient: Justin [name]  Procedure(s) Performed: CARDIOVERSION     Patient location during evaluation: Endoscopy Anesthesia Type: General Level of consciousness: awake and alert, patient cooperative and oriented Pain management: pain level controlled Vital Signs Assessment: post-procedure vital signs reviewed and stable Respiratory status: spontaneous breathing, nonlabored ventilation and respiratory function stable Cardiovascular status: blood pressure returned to baseline and stable Postop Assessment: no apparent nausea or vomiting and able to ambulate Anesthetic complications: no   No notable events documented.  Last Vitals:  Vitals:   04/24/21 1130 04/24/21 1140  BP: (!) 86/50 (!) 99/51  Pulse: 74 (!) 58  Resp: (!) 21 15  Temp:    SpO2: 96% 95%    Last Pain:  Vitals:   04/24/21 1140  TempSrc:   PainSc: 0-No pain                 Arushi Partridge,E. Karan Inclan

## 2021-04-24 NOTE — Transfer of Care (Signed)
Immediate Anesthesia Transfer of Care Note  Patient: Justin [name]  Procedure(s) Performed: CARDIOVERSION  Patient Location: Endoscopy Unit  Anesthesia Type:General  Level of Consciousness: drowsy and patient cooperative  Airway & Oxygen Therapy: Patient Spontanous Breathing and Patient connected to face mask oxygen  Post-op Assessment: Report given to RN and Post -op Vital signs reviewed and stable  Post vital signs: Reviewed and stable  Last Vitals:  Vitals Value Taken Time  BP 101/51   Temp    Pulse 61   Resp 23   SpO2 99     Last Pain:  Vitals:   04/24/21 1023  TempSrc: Temporal  PainSc: 0-No pain         Complications: No notable events documented.

## 2021-04-26 ENCOUNTER — Encounter (HOSPITAL_COMMUNITY): Payer: Self-pay | Admitting: Internal Medicine

## 2021-04-26 ENCOUNTER — Other Ambulatory Visit: Payer: Self-pay | Admitting: Family Medicine

## 2021-04-27 NOTE — Progress Notes (Signed)
Remote ICD transmission.   

## 2021-05-04 ENCOUNTER — Telehealth: Payer: Self-pay

## 2021-05-04 NOTE — Telephone Encounter (Signed)
Called patient to send manual transmission, states he will send in a little bit. Advised if in AF Dr. [name] would like to send to AF clinic, if not continue to monitor. Advised patient, either way someone will call him back for follow up. Appreciative for call.

## 2021-05-04 NOTE — Telephone Encounter (Signed)
Remote transmission received and reviewed. Presenting rhythm ~ AS/BVP 73 bpm. Patient called and advised to continue to monitor at this time and call back if he thinks he goes back in AF. Verbalized understanding.

## 2021-05-04 NOTE — Telephone Encounter (Signed)
-----   Message from Deboraha Sprang, MD sent at 05/04/2021 11:03 AM EDT ----- Remote reviewed. This remote is abnormal for afib   Ladies can you have him transmit and if he is in afib, could you please send him to Afib clinic for DCCV Thanks SK

## 2021-05-04 NOTE — Telephone Encounter (Signed)
Pt cardioverted 8/30   Continue to follow

## 2021-05-09 NOTE — Progress Notes (Deleted)
Cardiology Office Note:    Date:  05/09/2021   ID:  Justin [name], Justin [name] 1934/02/18, MRN [id number]  PCP:  Laurey Morale, MD   [facility] Cardiologist:  [name], MD { Click to update primary MD,subspecialty MD or APP then REFRESH:1}    Referring MD: Laurey Morale, MD   No chief complaint on file. ***  History of Present Illness:    Justin [name] is a 87 y.o. male with a hx of ***  Past Medical History:  Diagnosis Date   Age-related macular degeneration, dry, both eyes    Allergic    "24/7; 365 days/year; I'm allergic to pollens, dust, all southern grasses/trees, mold, mildue, cats, dogs" (01/07/2017)   Anal fissure    Asthma    sees [name]    Benign prostatic hypertrophy    (sees Dr. Denman [place]   CAD (coronary artery disease)    a. s/p CABG 2007. b. Cath 08/2016 - 4/4 patent grafts.   Carotid bruit    carotid u/s 10/10: 0.39% bilaterally   Chronic combined systolic and diastolic CHF (congestive heart failure) (HCC)    Complication of anesthesia 1980s   "w/anal cyst OR, he gave me a saddle block then put a narcotic in spinal cord; had a severe reaction to that" (01/07/2017)   Congestive heart failure (CHF) [facility])    ED (erectile dysfunction)    Family history of adverse reaction to anesthesia    "daughter wakes up during OR" (01/07/2017)   GERD (gastroesophageal reflux disease)    Gout    HTN (hypertension)    Hx of colonic polyps    (sees Dr. Henrene Pastor)   Hyperlipidemia    Hypothyroidism    Moderate to severe aortic stenosis    a. s/p TAVR 02/2017.   Myocardial infarction [facility]) ~ 2000   Obesity    Osteoarthritis    "was in my knees, hands" (01/07/2017 )   PAF (paroxysmal atrial fibrillation) ([place])    a. documented post TAVR.   Precancerous skin lesion    (sees Dr. 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   29 mm Edwards Sapien 3  transcatheter heart valve placed via percutaneous right transfemoral approach    Past Surgical History:  Procedure Laterality Date   ANUS SURGERY     "opened it back up cause it wouldn't heal; wound up w/a fissure" (01/07/2017)   BIV ICD INSERTION CRT-D N/A 05/02/2017   Procedure: BIV ICD INSERTION CRT-D;  Surgeon: Deboraha Sprang, MD;  Location: [place] CV LAB;  Service: Cardiovascular;  Laterality: N/A;   CARDIAC CATHETERIZATION  10/29/2005   CARDIAC CATHETERIZATION N/A 08/27/2016   Procedure: Right/Left Heart Cath and Coronary/Graft Angiography;  Surgeon: Burnell Blanks, MD;  Location: [place] CV LAB;  Service: Cardiovascular;  Laterality: N/A;   CARDIOVERSION N/A 04/24/2021   Procedure: CARDIOVERSION;  Surgeon: Fay Records, MD;  [facility] ENDOSCOPY;  Service: Cardiovascular;  Laterality: N/A;   CATARACT EXTRACTION W/ INTRAOCULAR LENS  IMPLANT, BILATERAL Bilateral    CATARACT EXTRACTION, BILATERAL  2012   COLONOSCOPY  06/30/2008   no repeats needed    COLONOSCOPY     had 3 or 4 in the past    CORONARY ARTERY BYPASS GRAFT  2007   "CABG X4"   CYST EXCISION PERINEAL  1980s   HAMMER TOE SURGERY Bilateral  JOINT REPLACEMENT     KNEE ARTHROPLASTY  07/30/2011   Procedure: COMPUTER ASSISTED TOTAL KNEE ARTHROPLASTY;  Surgeon: Meredith Pel;  Location: [place];  Service: Orthopedics;  Laterality: Left;  left total knee arthroplasty   MASTECTOMY SUBCUTANEOUS Bilateral    MULTIPLE TOOTH EXTRACTIONS     ORIF FINGER / THUMB FRACTURE Right ~ 1980   "repair of thumb injury"   PROSTATE BIOPSY     REPLACEMENT TOTAL KNEE BILATERAL Bilateral 2012   TEE WITHOUT CARDIOVERSION N/A 03/04/2017   Procedure: TRANSESOPHAGEAL ECHOCARDIOGRAM (TEE);  Surgeon: Burnell Blanks, MD;  Location: [place];  Service: Open Heart Surgery;  Laterality: N/A;   TOTAL KNEE REVISION Right 05/18/2019   Procedure: RIGHT PATELLA REVISION/REMOVAL;  Surgeon: Meredith Pel, MD;  Location: [place];   Service: Orthopedics;  Laterality: Right;   TRANSCATHETER AORTIC VALVE REPLACEMENT, TRANSFEMORAL N/A 03/04/2017   Procedure: TRANSCATHETER AORTIC VALVE REPLACEMENT, TRANSFEMORAL;  Surgeon: Burnell Blanks, MD;  Location: [place];  Service: Open Heart Surgery;  Laterality: N/A;    Current Medications: No outpatient medications have been marked as taking for the 05/16/21 encounter (Appointment) with Deberah Pelton, NP.     Allergies:   Peanut-containing drug products, Sulfonamide derivatives, Amlodipine, Eliquis [apixaban], Lisinopril, and Xarelto [rivaroxaban]   Social History   Socioeconomic History   Marital status: Married    Spouse name: Not on file   Number of children: 3   Years of education: Not on file   Highest education level: Not on file  Occupational History   Occupation: Clinical biochemist    Comment: builds malls  Tobacco Use   Smoking status: Former    Packs/day: 3.50    Years: 13.00    Pack years: 45.50    Types: Cigarettes    Quit date: 1963    Years since quitting: 59.7   Smokeless tobacco: Never   Tobacco comments:    Former smoker 04/19/21  Vaping Use   Vaping Use: Never used  Substance and Sexual Activity   Alcohol use: Not Currently    Alcohol/week: 7.0 standard drinks    Types: 7 Cans of beer per week    Comment: 1 beer daily after 5pm (04/19/21)   Drug use: No   Sexual activity: Never  Other Topics Concern   Not on file  Social History Narrative   FH of CAD, Male 1st degree relative less than age 18.   Social Determinants of Health   Financial Resource Strain: Low Risk    Difficulty of Paying Living Expenses: Not very hard  Food Insecurity: No Food Insecurity   Worried About Charity fundraiser in the Last Year: Never true   Ran Out of Food in the Last Year: Never true  Transportation Needs: No Transportation Needs   Lack of Transportation (Medical): No   Lack of Transportation (Non-Medical): No  Physical Activity: Insufficiently  Active   Days of Exercise per Week: 4 days   Minutes of Exercise per Session: 30 min  Stress: No Stress Concern Present   Feeling of Stress : Not at all  Social Connections: Moderately Isolated   Frequency of Communication with Friends and Family: Three times a week   Frequency of Social Gatherings with Friends and Family: Three times a week   Attends Religious Services: Never   Active Member of Clubs or Organizations: No   Attends Archivist Meetings: Never   Marital Status: Married     Family History: The patient's ***family  history includes Allergic rhinitis in his father; Asthma in his father; Breast cancer in his mother; Heart attack (age of onset: 2) in his father; Heart failure (age of onset: 80) in his mother; Uterine cancer in his mother. There is no history of Colon cancer or Esophageal cancer.  ROS:   Please see the history of present illness.    *** All other systems reviewed and are negative.   Risk Assessment/Calculations:   {Does this patient have ATRIAL FIBRILLATION?:[phone]}       Physical Exam:    VS:  There were no vitals taken for this visit.    Wt Readings from Last 3 Encounters:  04/24/21 264 lb 6.4 oz (119.9 kg)  04/20/21 280 lb 9.6 oz (127.3 kg)  04/19/21 281 lb 12.8 oz (127.8 kg)     GEN: *** Well nourished, well developed in no acute distress HEENT: Normal NECK: No JVD; No carotid bruits LYMPHATICS: No lymphadenopathy CARDIAC: ***RRR, no murmurs, rubs, gallops RESPIRATORY:  Clear to auscultation without rales, wheezing or rhonchi  ABDOMEN: Soft, non-tender, non-distended MUSCULOSKELETAL:  No edema; No deformity  SKIN: Warm and dry NEUROLOGIC:  Alert and oriented x 3 PSYCHIATRIC:  Normal affect    EKGs/Labs/Other Studies Reviewed:    The following studies were reviewed today: ***  EKG:  EKG is *** ordered today.  The ekg ordered today demonstrates ***  Recent Labs: 03/28/2021: ALT 16; Pro B Natriuretic peptide (BNP) 354.0;  TSH 5.39 04/19/2021: BUN 25; Creatinine, Ser 1.07; Hemoglobin 13.0; Platelets 241; Potassium 4.8; Sodium 135  Recent Lipid Panel    Component Value Date/Time   CHOL 124 03/28/2021 0730   CHOL 171 08/11/2018 0735   TRIG 161.0 (H) 03/28/2021 0730   TRIG 143 06/30/2006 1132   HDL 34.60 (L) 03/28/2021 0730   HDL 34 (L) 08/11/2018 0735   CHOLHDL 4 03/28/2021 0730   VLDL 32.2 03/28/2021 0730   LDLCALC 57 03/28/2021 0730   LDLCALC 68 03/21/2020 0943    ASSESSMENT & PLAN    ***   {Are you ordering a CV Procedure (e.g. stress test, cath, DCCV, TEE, etc)?   Press F2        :[id number]    Medication Adjustments/Labs and Tests Ordered: Current medicines are reviewed at length with the patient today.  Concerns regarding medicines are outlined above.  No orders of the defined types were placed in this encounter.  No orders of the defined types were placed in this encounter.   There are no Patient Instructions on file for this visit.   Signed, Deberah Pelton, NP  05/09/2021 7:24 AM      Notice: This dictation was prepared with Dragon dictation along with smaller phrase technology. Any transcriptional errors that result from this process are unintentional and may not be corrected upon review.  I spent***minutes examining this patient, reviewing medications, and using patient centered shared decision making involving her cardiac care.  Prior to her visit I spent greater than 20 minutes reviewing her past medical history,  medications, and prior cardiac tests.

## 2021-05-14 ENCOUNTER — Ambulatory Visit (INDEPENDENT_AMBULATORY_CARE_PROVIDER_SITE_OTHER): Payer: Medicare Other

## 2021-05-14 ENCOUNTER — Other Ambulatory Visit: Payer: Self-pay | Admitting: Family Medicine

## 2021-05-14 DIAGNOSIS — Z95810 Presence of automatic (implantable) cardiac defibrillator: Secondary | ICD-10-CM

## 2021-05-14 DIAGNOSIS — I5042 Chronic combined systolic (congestive) and diastolic (congestive) heart failure: Secondary | ICD-10-CM

## 2021-05-15 NOTE — Progress Notes (Signed)
EPIC Encounter for ICM Monitoring  Patient Name: Justin [name] is a 87 y.o. male Date: 05/15/2021 Primary Care Physican: Laurey Morale, MD Primary Cardiologist: Angelena Form Electrophysiologist: Vergie Living Pacing: 77.6% Effective 74.8% (was 85.1 % on 11/14/2020 report) 04/11/2021 Weight: 273 lbs 04/17/2021 Weight: 278 lbs 05/15/2021 Weight: 274 lbs   Since 04-May-2021 Time in AT/AF (0.0%)                                                            Spoke with patient and heart failure questions reviewed.  Pt voices concern about swelling in ankles even though he is taking Metolazone 3 days per week and Furosemide as prescribed.  Cardioversion completed 04/24/2021.     Optivol thoracic impedance suggesting normal fluid levels.     Prescribed: Furosemide 80 mg take 1 tablet every day.  KLC 20 mEq take 2 tablets every other day alternating with 1 tablet every other day. Metolazone 2.5 mg Take 1 tablet by mouth 3 days per week Spironolactone 25 mg take 1 tablet daily   Labs: 03/28/2021 Creatinine 1.24, BUN 31, Potassium 4.7, Sodium 135, GFR 53.29 11/14/2020 Creatinine 1.05, BUN 25, Potassium 4.6, Sodium 137, GFR 65.23 A complete set of results can be found in Results Review.   Recommendations:     Follow-up plan: ICM clinic phone appointment on 06/18/2021.   91 day device clinic remote transmission 07/09/2021.     EP/Cardiology Office Visits:  06/18/2021 with Coletta Memos, NP.  05/31/2021 with Dr [name]   Copy of ICM check sent to Dr. [name].   3 month ICM trend: 05/14/2021.    1 Year ICM trend:       [name], RN 05/15/2021 12:57 PM

## 2021-05-16 ENCOUNTER — Ambulatory Visit (HOSPITAL_BASED_OUTPATIENT_CLINIC_OR_DEPARTMENT_OTHER): Payer: Medicare Other | Admitting: General Practice

## 2021-05-21 ENCOUNTER — Other Ambulatory Visit: Payer: Self-pay | Admitting: Cardiovascular Disease

## 2021-05-22 MED ORDER — POTASSIUM CHLORIDE CRYS ER 20 MEQ PO TBCR: 40.0000 meq | EXTENDED_RELEASE_TABLET | Freq: Every day | ORAL | 3 refills | Status: DC

## 2021-05-24 ENCOUNTER — Telehealth: Payer: Self-pay | Admitting: Pharmacist

## 2021-05-24 DIAGNOSIS — H353221 Exudative age-related macular degeneration, left eye, with active choroidal neovascularization: Secondary | ICD-10-CM | POA: Diagnosis not present

## 2021-05-24 NOTE — Chronic Care Management (AMB) (Signed)
Chronic Care Management Pharmacy Assistant   Name: Justin [name]  MRN: [id number] DOB: May 16, 1934  Reason for Encounter: Follow up for Pradaxa patient assistance.  Spoke with patient, he states he was notified and Pradaxa has been denied, reason for denial is the medication will become generic in January 2023 and they are no longer approving Pradaxa for patient assistance. He states his cardiologist is aware of this and he will be following tomorrow with Dr. [name] he plans to discuss this with him.    Medications: Outpatient Encounter Medications as of 05/24/2021  Medication Sig   aspirin 81 MG tablet Take 1 tablet (81 mg total) by mouth daily.   atorvastatin (LIPITOR) 20 MG tablet TAKE 1 TABLET ONCE DAILY.   Azelastine HCl 0.15 % SOLN Place 2 sprays into the nose in the morning and at bedtime.   benzonatate (TESSALON) 200 MG capsule Take 1 capsule (200 mg total) by mouth 2 (two) times daily as needed for cough.   colchicine 0.6 MG tablet Take 1 tablet (0.6 mg total) by mouth daily.   dabigatran (PRADAXA) 150 MG CAPS capsule TAKE (1) CAPSULE TWICE DAILY.   diphenhydrAMINE (BENADRYL) 25 MG tablet Take 25 mg by mouth at bedtime.    ENTRESTO 97-103 MG TAKE 1 TABLET BY MOUTH TWICE DAILY.   EPINEPHrine 0.3 mg/0.3 mL IJ SOAJ injection Inject 0.3 mLs (0.3 mg total) into the muscle as needed for anaphylaxis.   famotidine (PEPCID) 20 MG tablet Take 1 tablet (20 mg total) by mouth at bedtime.   fluorouracil (EFUDEX) 5 % cream Apply 1 application topically 2 (two) times daily as needed (rash).   furosemide (LASIX) 80 MG tablet Take 1 tablet (80 mg total) by mouth daily.   hydrOXYzine (ATARAX/VISTARIL) 10 MG tablet Take 10 mg by mouth at bedtime.   isosorbide mononitrate (IMDUR) 30 MG 24 hr tablet TAKE 1 TABLET ONCE DAILY.   ketotifen (ZADITOR) 0.025 % ophthalmic solution Apply 1 drop to eye daily as needed (itchy eyes).    levocetirizine (XYZAL) 5 MG tablet Take 5 mg by mouth daily.    levothyroxine (SYNTHROID) 100 MCG tablet Take 1 tablet (100 mcg total) by mouth daily.   meclizine (ANTIVERT) 25 MG tablet Take 1 tablet (25 mg total) by mouth every 4 (four) hours as needed for dizziness.   methocarbamol (ROBAXIN) 500 MG tablet Take 1 tablet (500 mg total) by mouth every 8 (eight) hours as needed for muscle spasms.   metoCLOPramide (REGLAN) 10 MG tablet Take 1 tablet every morning with breakfast   metolazone (ZAROXOLYN) 2.5 MG tablet Take 1 tablet by mouth 3 days per week   metoprolol succinate (TOPROL-XL) 50 MG 24 hr tablet Take 1.5 tablets (75 mg total) by mouth daily. Take with or immediately following a meal.   montelukast (SINGULAIR) 10 MG tablet TAKE 1 TABLET ONCE DAILY IN THE EVENING.   Multiple Vitamins-Minerals (PRESERVISION AREDS PO) Take 2 tablets by mouth every morning.   omeprazole (PRILOSEC) 20 MG capsule TAKE (1) CAPSULE TWICE DAILY.   oxyCODONE (OXY IR/ROXICODONE) 5 MG immediate release tablet Take 1 tablet (5 mg total) by mouth every 12 (twelve) hours as needed for severe pain.   potassium bicarbonate (K-LYTE) 25 MEQ disintegrating tablet Take 25 mEq by mouth 2 (two) times daily.   potassium chloride SA (KLOR-CON) 20 MEQ tablet Take 2 tablets (40 mEq total) by mouth daily. Take 2 tablets daily alternating with 1 tablet every other day   spironolactone (ALDACTONE) 25 MG  tablet TAKE 1 TABLET ONCE DAILY.   triamcinolone cream (KENALOG) 0.1 % Apply 1 application topically daily as needed for dry skin.   valACYclovir (VALTREX) 500 MG tablet Take 500 mg by mouth 2 (two) times daily as needed (cold sores).   No facility-administered encounter medications on file as of 05/24/2021.    Care Gaps: AWV - completed on 03/22/21 Covid - 19 booster 4 - overdue since 08/30/20 Influenza vaccine - due  Star Rating Drugs: Atorvastatin 20mg  - last filled on 04/26/2021 90DS at [facility]) 97-103mg  - last filled 02/13/2021 [place] [phone]

## 2021-05-25 ENCOUNTER — Encounter: Payer: Medicare Other | Admitting: Internal Medicine

## 2021-05-30 DIAGNOSIS — L578 Other skin changes due to chronic exposure to nonionizing radiation: Secondary | ICD-10-CM | POA: Diagnosis not present

## 2021-05-30 DIAGNOSIS — C44622 Squamous cell carcinoma of skin of right upper limb, including shoulder: Secondary | ICD-10-CM | POA: Diagnosis not present

## 2021-05-30 DIAGNOSIS — L570 Actinic keratosis: Secondary | ICD-10-CM | POA: Diagnosis not present

## 2021-05-31 ENCOUNTER — Encounter: Payer: Medicare Other | Admitting: Internal Medicine

## 2021-06-05 ENCOUNTER — Telehealth: Payer: Self-pay | Admitting: Cardiovascular Disease

## 2021-06-05 NOTE — Telephone Encounter (Signed)
Called pt to inform him that I called his pharmacy and they have the correct dose and directions and that pt picked up medication, dispensing 180 tablet, with pt taking 2 tablets daily, from his pharmacy on 05/22/21. I advised pt that if he has any other problems, questions or concerns, to give our office a call back. Pt verbalized understanding.

## 2021-06-05 NOTE — Telephone Encounter (Signed)
*  STAT* If patient is at the pharmacy, call can be transferred to refill team.   1. Which medications need to be refilled? (please list name of each medication and dose if known) potassium chloride SA (KLOR-CON) 20 MEQ tablet  2. Which pharmacy/location (including street and city if local pharmacy) is medication to be sent to? [place], [address]  3. Do they need a 30 day or 90 day supply? 90 day  Patient states it was written for the wrong amount it should be for two tablets daily.

## 2021-06-08 ENCOUNTER — Telehealth: Payer: Self-pay | Admitting: Pharmacist

## 2021-06-08 ENCOUNTER — Other Ambulatory Visit: Payer: Self-pay | Admitting: Family Medicine

## 2021-06-08 NOTE — Chronic Care Management (AMB) (Signed)
Chronic Care Management Pharmacy Assistant   Name: Justin [name]  MRN: [id number] DOB: 28-Feb-1934   Reason for Encounter: Disease State/ Hyperlipidemia Assessment Call.    Conditions to be addressed/monitored: HLD  Recent office visits:  None.  Recent consult visits:  04/20/21 [name] MD (Cardiology) - seen for CAD and other issues. Changed furosemide 80mg  from one tablet daily alternating with a half a tablet every other day to 1 tablet daily. Changed metolazone 2.5mg  to 3 days per week. Follow up in 12 weeks.   04/19/21 Malka So MD (Cardiology) - seen for persistent A-FIB. No medication changes. Follow up with Dr. [name], Dr. Angelena Form and Coletta Memos as scheduled.   Hospital visits:  Hospital visit on 04/24/21 for cardioversion performed by [name] MD.   Medications: Outpatient Encounter Medications as of 06/08/2021  Medication Sig   aspirin 81 MG tablet Take 1 tablet (81 mg total) by mouth daily.   atorvastatin (LIPITOR) 20 MG tablet TAKE 1 TABLET ONCE DAILY.   Azelastine HCl 0.15 % SOLN Place 2 sprays into the nose in the morning and at bedtime.   benzonatate (TESSALON) 200 MG capsule Take 1 capsule (200 mg total) by mouth 2 (two) times daily as needed for cough.   colchicine 0.6 MG tablet Take 1 tablet (0.6 mg total) by mouth daily.   dabigatran (PRADAXA) 150 MG CAPS capsule TAKE (1) CAPSULE TWICE DAILY.   diphenhydrAMINE (BENADRYL) 25 MG tablet Take 25 mg by mouth at bedtime.    ENTRESTO 97-103 MG TAKE 1 TABLET BY MOUTH TWICE DAILY.   EPINEPHrine 0.3 mg/0.3 mL IJ SOAJ injection Inject 0.3 mLs (0.3 mg total) into the muscle as needed for anaphylaxis.   famotidine (PEPCID) 20 MG tablet Take 1 tablet (20 mg total) by mouth at bedtime.   fluorouracil (EFUDEX) 5 % cream Apply 1 application topically 2 (two) times daily as needed (rash).   furosemide (LASIX) 80 MG tablet Take 1 tablet (80 mg total) by mouth daily.   hydrOXYzine (ATARAX/VISTARIL) 10 MG  tablet Take 10 mg by mouth at bedtime.   isosorbide mononitrate (IMDUR) 30 MG 24 hr tablet TAKE 1 TABLET ONCE DAILY.   ketotifen (ZADITOR) 0.025 % ophthalmic solution Apply 1 drop to eye daily as needed (itchy eyes).    levocetirizine (XYZAL) 5 MG tablet Take 5 mg by mouth daily.   levothyroxine (SYNTHROID) 100 MCG tablet Take 1 tablet (100 mcg total) by mouth daily.   meclizine (ANTIVERT) 25 MG tablet Take 1 tablet (25 mg total) by mouth every 4 (four) hours as needed for dizziness.   methocarbamol (ROBAXIN) 500 MG tablet Take 1 tablet (500 mg total) by mouth every 8 (eight) hours as needed for muscle spasms.   metoCLOPramide (REGLAN) 10 MG tablet TAKE ONE TABLET EVERY MORNING WITH BREAKFAST   metolazone (ZAROXOLYN) 2.5 MG tablet Take 1 tablet by mouth 3 days per week   metoprolol succinate (TOPROL-XL) 50 MG 24 hr tablet Take 1.5 tablets (75 mg total) by mouth daily. Take with or immediately following a meal.   montelukast (SINGULAIR) 10 MG tablet TAKE 1 TABLET ONCE DAILY IN THE EVENING.   Multiple Vitamins-Minerals (PRESERVISION AREDS PO) Take 2 tablets by mouth every morning.   omeprazole (PRILOSEC) 20 MG capsule TAKE (1) CAPSULE TWICE DAILY.   oxyCODONE (OXY IR/ROXICODONE) 5 MG immediate release tablet Take 1 tablet (5 mg total) by mouth every 12 (twelve) hours as needed for severe pain.   potassium bicarbonate (K-LYTE) 25 MEQ  disintegrating tablet Take 25 mEq by mouth 2 (two) times daily.   potassium chloride SA (KLOR-CON) 20 MEQ tablet Take 2 tablets (40 mEq total) by mouth daily. Take 2 tablets daily alternating with 1 tablet every other day   spironolactone (ALDACTONE) 25 MG tablet TAKE 1 TABLET ONCE DAILY.   triamcinolone cream (KENALOG) 0.1 % Apply 1 application topically daily as needed for dry skin.   valACYclovir (VALTREX) 500 MG tablet Take 500 mg by mouth 2 (two) times daily as needed (cold sores).   No facility-administered encounter medications on file as of 06/08/2021.   Fill  History:                     atorvastatin 20 mg tablet 04/26/2021 90 azelastine 137 mcg (0.1 %) nasal spray aerosol 04/03/2021 75   benzonatate 200 mg capsule 04/26/2021 90   colchicine 0.6 mg tablet 11/08/2020 30   Pradaxa 150 mg capsule 03/15/2021 90   famotidine 20 mg tablet 04/13/2021 90   fluorouracil 5 % topical cream 05/30/2021 30   fluticasone propionate 50 mcg/actuation nasal spray,suspension 04/03/2021 90   furosemide 80 mg tablet 05/21/2021 90   isosorbide mononitrate ER 30 mg tablet,extended release 24 hr 05/14/2021 90   levothyroxine 100 mcg tablet 03/02/2021 90   metoclopramide 10 mg tablet 05/07/2021 30   metoprolol succinate ER 50 mg tablet,extended release 24 hr 04/26/2021 90   montelukast 10 mg tablet 05/21/2021 90   omeprazole 20 mg capsule,delayed release 05/12/2021 90   potassium chloride ER 20 mEq tablet,extended release(part/cryst) 05/21/2021 90   Entresto 97 mg-103 mg tablet 05/16/2021 30   spironolactone 25 mg tablet 04/26/2021 90   metolazone 2.5 mg tablet 04/20/2021 84   Comprehensive medication review performed; Spoke to patient regarding cholesterol  Lipid Panel    Component Value Date/Time   CHOL 124 03/28/2021 0730   CHOL 171 08/11/2018 0735   TRIG 161.0 (H) 03/28/2021 0730   TRIG 143 06/30/2006 1132   HDL 34.60 (L) 03/28/2021 0730   HDL 34 (L) 08/11/2018 0735   LDLCALC 57 03/28/2021 0730   LDLCALC 68 03/21/2020 0943    10-year ASCVD risk score: The ASCVD Risk score (Arnett DK, et al., 2019) failed to calculate for the following reasons:   The 2019 ASCVD risk score is only valid for ages 71 to 3   The patient has a prior MI or stroke diagnosis  Current antihyperlipidemic regimen:  Atorvastatin 20mg  - take 1 tablet once daily.  Previous antihyperlipidemic medications tried: None. ASCVD risk enhancing conditions: age >62, HTN, and CHF What recent interventions/DTPs have been made by any provider to improve Cholesterol control  since last CPP Visit: None. Any recent hospitalizations or ED visits since last visit with CPP? No  Adherence Review: Does the patient have >5 day gap between last estimated fill dates? No  Notes: Spoke with patient and reviewed all medications as prescribed. Patient reports he stopped taking his levothyroxine about 10 days ago due to it causing him to dry heave. Patient reports taking all other medications and no other issues at this time. For breakfast patient has a boiled egg, some cereal with lactaid free milk and some applesauce to take his medications. Patient stated he has a bowl of chunky brand soup and a glass of water and then fish or hamburger with some green beans and tomatoes for dinner. Patient drinks plenty of water and gets a lot of activity around home especially since his wife has been  having back issues. Every evening around 5 he has a beer and some popcorn with his wife Jeani Hawking. Patient thanked me for my call.    Care Gaps:  AWV - completed on 03/22/21 Covid-19 vaccine booster 4 - overdue since 2021 Flu vaccine - due  Star Rating Drugs:  Atorvastatin 20mg  - last filled on 04/26/21 90DS at [place] Pharmacist Assistant [phone]

## 2021-06-12 NOTE — Telephone Encounter (Cosign Needed)
2nd attempt

## 2021-06-15 NOTE — Progress Notes (Signed)
Cardiology Office Note:    Date:  06/15/2021   ID:  Justin, [name] August 25, 1934, MRN [id number]  PCP:  Laurey Morale, MD   [facility] Providers Cardiologist:  [name], MD      Referring MD: Laurey Morale, MD   Follow-up for atrial fibrillation  History of Present Illness:    Justin [name] is a 87 y.o. male with a hx of hypertension, hyperlipidemia, severe aortic stenosis status post TAVR, paroxysmal atrial fibrillation, chronic diastolic and systolic CHF, coronary artery disease status post CABG x4, syncope, junctional bradycardia, PVCs, nonsustained VT, LBBB, and ICD implantation.  He underwent cardiac catheterization 1/18 which showed all 4 of his grafts to be patent.  He was noted to have progression of aortic stenosis with worsening dyspnea and acute on chronic CHF spring 2018.  Echocardiogram 5/18 showed mildly reduced LV systolic function, LVEF 42-87%, severe aortic stenosis.  He underwent TAVR 03/04/2017.  He developed atrial fibrillation postoperatively and was placed on IV amiodarone with conversion to NSR.  During his follow-up visit 03/19/2017 he was noted to have irregular rhythm with intraventricular condition delayed.  EP was evaluated felt his rhythm was 2 1 conduction with competing fast and slow pathways.  A cardiac event monitor showed periods of junctional rhythm, frequent PVCs, frequent PACs and runs of wide-complex tachycardia.  He received ICD 05/03/2017.  His echocardiogram 9/20 showed an LVEF of 35-40%.  His losartan was transitioned to [place].  He was found to be in atrial fibrillation with device interrogation 04/05/2021.  He was seen in the atrial fibrillation clinic 04/19/2021.  Plans for cardioversion were made at that time.  He was seen by Dr. Angelena Form 04/20/2021.  During that time he denied chest pain, palpitations, dizziness, near-syncope and syncope.  He noted ongoing dyspnea but denied orthopnea and PND.  Underwent successful DCCV on  04/24/2021.  He received 1 shock of 200 J.  He presents to the clinic today for follow-up evaluation and states he has noticed some increased shortness of breath with increased physical activity.  He is also having some decreased activity tolerance.  He reports that since he had multiple skin lesions removed he has not been exercising as much and has gained some weight.  He was recently started on increased furosemide, metolazone, and furosemide.  He is tolerating his medications well.  His weight has been decreasing.  We will have him continue to increase his physical activity, check his renal function and electrolytes, give him a salty 6 diet sheet, and we will have him start to restrict his fluids.  Today he denies chest pain, fatigue, palpitations, melena, hematuria, hemoptysis, diaphoresis, weakness, presyncope, syncope, orthopnea, and PND.   Past Medical History:  Diagnosis Date   Age-related macular degeneration, dry, both eyes    Allergic    "24/7; 365 days/year; I'm allergic to pollens, dust, all southern grasses/trees, mold, mildue, cats, dogs" (01/07/2017)   Anal fissure    Asthma    sees [name]    Benign prostatic hypertrophy    (sees Dr. Denman George   CAD (coronary artery disease)    a. s/p CABG 2007. b. Cath 08/2016 - 4/4 patent grafts.   Carotid bruit    carotid u/s 10/10: 0.39% bilaterally   Chronic combined systolic and diastolic CHF (congestive heart failure) (HCC)    Complication of anesthesia 1980s   "w/anal cyst OR, he gave me a saddle block then put a narcotic in spinal cord; had a severe  reaction to that" (01/07/2017)   Congestive heart failure (CHF) [facility])    ED (erectile dysfunction)    Family history of adverse reaction to anesthesia    "daughter wakes up during OR" (01/07/2017)   GERD (gastroesophageal reflux disease)    Gout    HTN (hypertension)    Hx of colonic polyps    (sees Dr. Henrene Pastor)   Hyperlipidemia    Hypothyroidism    Moderate to severe aortic  stenosis    a. s/p TAVR 02/2017.   Myocardial infarction [facility]) ~ 2000   Obesity    Osteoarthritis    "was in my knees, hands" (01/07/2017 )   PAF (paroxysmal atrial fibrillation) ([place])    a. documented post TAVR.   Precancerous skin lesion    (sees Dr. Allyson Sabal)   Prostate cancer [facility]) dx'd ~ 2014   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   29 mm Edwards Sapien 3 transcatheter heart valve placed via percutaneous right transfemoral approach    Past Surgical History:  Procedure Laterality Date   ANUS SURGERY     "opened it back up cause it wouldn't heal; wound up w/a fissure" (01/07/2017)   BIV ICD INSERTION CRT-D N/A 05/02/2017   Procedure: BIV ICD INSERTION CRT-D;  Surgeon: Deboraha Sprang, MD;  Location: [place] [facility];  Service: Cardiovascular;  Laterality: N/A;   CARDIAC CATHETERIZATION  10/29/2005   CARDIAC CATHETERIZATION N/A 08/27/2016   Procedure: Right/Left Heart Cath and Coronary/Graft Angiography;  Surgeon: Burnell Blanks, MD;  Location: [place] [facility];  Service: Cardiovascular;  Laterality: N/A;   CARDIOVERSION N/A 04/24/2021   Procedure: CARDIOVERSION;  Surgeon: Fay Records, MD;  Location: [facility] ENDOSCOPY;  Service: Cardiovascular;  Laterality: N/A;   CATARACT EXTRACTION W/ INTRAOCULAR LENS  IMPLANT, BILATERAL Bilateral    CATARACT EXTRACTION, BILATERAL  2012   COLONOSCOPY  06/30/2008   no repeats needed    COLONOSCOPY     had 3 or 4 in the past    CORONARY ARTERY BYPASS GRAFT  2007   "CABG X4"   CYST EXCISION PERINEAL  1980s   HAMMER TOE SURGERY Bilateral    JOINT REPLACEMENT     KNEE ARTHROPLASTY  07/30/2011   Procedure: COMPUTER ASSISTED TOTAL KNEE ARTHROPLASTY;  Surgeon: Meredith Pel;  Location: [place];  Service: Orthopedics;  Laterality: Left;  left total knee arthroplasty   MASTECTOMY SUBCUTANEOUS Bilateral    MULTIPLE TOOTH EXTRACTIONS     ORIF FINGER / THUMB FRACTURE Right ~ 1980   "repair of thumb  injury"   PROSTATE BIOPSY     REPLACEMENT TOTAL KNEE BILATERAL Bilateral 2012   TEE WITHOUT CARDIOVERSION N/A 03/04/2017   Procedure: TRANSESOPHAGEAL ECHOCARDIOGRAM (TEE);  Surgeon: Burnell Blanks, MD;  Location: [place];  Service: Open Heart Surgery;  Laterality: N/A;   TOTAL KNEE REVISION Right 05/18/2019   Procedure: RIGHT PATELLA REVISION/REMOVAL;  Surgeon: Meredith Pel, MD;  Location: [place];  Service: Orthopedics;  Laterality: Right;   TRANSCATHETER AORTIC VALVE REPLACEMENT, TRANSFEMORAL N/A 03/04/2017   Procedure: TRANSCATHETER AORTIC VALVE REPLACEMENT, TRANSFEMORAL;  Surgeon: Burnell Blanks, MD;  Location: [place];  Service: Open Heart Surgery;  Laterality: N/A;    Current Medications: No outpatient medications have been marked as taking for the 06/18/21 encounter (Appointment) with Deberah Pelton, NP.     Allergies:   Peanut-containing drug products, Sulfonamide derivatives, Amlodipine, Eliquis [apixaban], Lisinopril, and Xarelto [rivaroxaban]   Social History  Socioeconomic History   Marital status: Married    Spouse name: Not on file   Number of children: 3   Years of education: Not on file   Highest education level: Not on file  Occupational History   Occupation: Clinical biochemist    Comment: builds malls  Tobacco Use   Smoking status: Former    Packs/day: 3.50    Years: 13.00    Pack years: 45.50    Types: Cigarettes    Quit date: 1963    Years since quitting: 59.8   Smokeless tobacco: Never   Tobacco comments:    Former smoker 04/19/21  Vaping Use   Vaping Use: Never used  Substance and Sexual Activity   Alcohol use: Not Currently    Alcohol/week: 7.0 standard drinks    Types: 7 Cans of beer per week    Comment: 1 beer daily after 5pm (04/19/21)   Drug use: No   Sexual activity: Never  Other Topics Concern   Not on file  Social History Narrative   FH of CAD, Male 1st degree relative less than age 1.   Social Determinants of  Health   Financial Resource Strain: Low Risk    Difficulty of Paying Living Expenses: Not very hard  Food Insecurity: No Food Insecurity   Worried About Charity fundraiser in the Last Year: Never true   Ran Out of Food in the Last Year: Never true  Transportation Needs: No Transportation Needs   Lack of Transportation (Medical): No   Lack of Transportation (Non-Medical): No  Physical Activity: Insufficiently Active   Days of Exercise per Week: 4 days   Minutes of Exercise per Session: 30 min  Stress: No Stress Concern Present   Feeling of Stress : Not at all  Social Connections: Moderately Isolated   Frequency of Communication with Friends and Family: Three times a week   Frequency of Social Gatherings with Friends and Family: Three times a week   Attends Religious Services: Never   Active Member of Clubs or Organizations: No   Attends Archivist Meetings: Never   Marital Status: Married     Family History: The patient's family history includes Allergic rhinitis in his father; Asthma in his father; Breast cancer in his mother; Heart attack (age of onset: 70) in his father; Heart failure (age of onset: 55) in his mother; Uterine cancer in his mother. There is no history of Colon cancer or Esophageal cancer.  ROS:   Please see the history of present illness.     All other systems reviewed and are negative.   Risk Assessment/Calculations:           Physical Exam:    VS:  There were no vitals taken for this visit.    Wt Readings from Last 3 Encounters:  04/24/21 264 lb 6.4 oz (119.9 kg)  04/20/21 280 lb 9.6 oz (127.3 kg)  04/19/21 281 lb 12.8 oz (127.8 kg)     GEN:  Well nourished, well developed in no acute distress HEENT: Normal NECK: No JVD; No carotid bruits LYMPHATICS: No lymphadenopathy CARDIAC: RRR, no murmurs, rubs, gallops RESPIRATORY:  Clear to auscultation without rales, wheezing or rhonchi  ABDOMEN: Soft, non-tender,  non-distended MUSCULOSKELETAL:  No edema; No deformity  SKIN: Warm and dry NEUROLOGIC:  Alert and oriented x 3 PSYCHIATRIC:  Normal affect    EKGs/Labs/Other Studies Reviewed:    The following studies were reviewed today: Echocardiogram 04/29/2019  IMPRESSIONS  1. The left ventricle has moderately reduced systolic function, with an  ejection fraction of 35-40%. The cavity size was normal. There is  moderately increased left ventricular wall thickness. Left ventricular  diastolic Doppler parameters are consistent  with impaired relaxation. Indeterminate filling pressures.   2. Severe hypokinesis of the left ventricular, entire anterior wall,  anteroseptal wall and apical segment.   3. The right ventricle has moderately reduced systolic function. The  cavity was normal. There is no increase in right ventricular wall  thickness.   4. Left atrial size was mildly dilated.   5. Right atrial size was mildly dilated.   6. The mitral valve is abnormal. Mild thickening of the mitral valve  leaflet. There is mild to moderate mitral annular calcification present.   7. The tricuspid valve is grossly normal.   8. A 11mm an Wende Crease Sapien bioprosthetic aortic valve (TAVR)  valve is present in the aortic position. Procedure Date: 03/13/2019 Normal  aortic valve prosthesis.   9. No stenosis of the aortic valve.  10. The aorta is abnormal unless otherwise noted.  11. There is mild dilatation of the ascending aorta measuring 40 mm.  12. When compared to the prior study: 02/06/2018: LVEF 40-45%, TAVR mean  gradient 9 mmHg, aorta 3.9 cm.   Cardiac catheterization 08/27/16  There is moderate aortic valve stenosis. SVG graft was visualized by angiography and is normal in caliber. 1st Mrg lesion, 65 %stenosed. Ost LM to LM lesion, 100 %stenosed. SVG graft was visualized by angiography and is normal in caliber. LIMA graft was visualized by angiography and is normal in caliber. Prox RCA to  Mid RCA lesion, 100 %stenosed. SVG graft was visualized by angiography and is normal in caliber and anatomically normal. Hemodynamic findings consistent with mild pulmonary hypertension.   1. 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  2. The Left main is occluded at the ostium.  3. The LAD fills from the patent IMA graft and the Diagonal fills from the patent vein graft 4. The Circumflex fills from the patent vein graft to the OM. There is a moderate stenosis in the proximal segment of the OM branch proximal to the insertion of the vein graft.  5. The distal RCA fills from the patent vein graft.  6. Moderate AS 7. Elevated filling pressures   Recommendations: Will continue medical management of CAD. Will start Lasix 20 mg daily for mild volume overload. No indication for AVR at this time.   Diagnostic Dominance: Right Intervention   EKG:  EKG is  ordered today.  The ekg ordered today demonstrates atrially sensed V paced with frequent PVCs 75 bpm  Recent Labs: 03/28/2021: ALT 16; Pro B Natriuretic peptide (BNP) 354.0; TSH 5.39 04/19/2021: BUN 25; Creatinine, Ser 1.07; Hemoglobin 13.0; Platelets 241; Potassium 4.8; Sodium 135  Recent Lipid Panel    Component Value Date/Time   CHOL 124 03/28/2021 0730   CHOL 171 08/11/2018 0735   TRIG 161.0 (H) 03/28/2021 0730   TRIG 143 06/30/2006 1132   HDL 34.60 (L) 03/28/2021 0730   HDL 34 (L) 08/11/2018 0735   CHOLHDL 4 03/28/2021 0730   VLDL 32.2 03/28/2021 0730   LDLCALC 57 03/28/2021 0730   LDLCALC 68 03/21/2020 0943    ASSESSMENT & PLAN    Paroxysmal atrial fibrillation-denies further episodes of irregular heartbeat or increased shortness of breath.  EKG today shows a sensed V paced with  frequent PVCs 75 bpm.  Underwent successful DCCV on 04/24/2021 with 1 shock, 200 J.  Reports compliance with Pradaxa and denies bleeding issues. Continue metoprolol, Pradaxa Heart  healthy low-sodium diet-salty 6 given Increase physical activity as tolerated Avoid triggers caffeine, chocolate, EtOH, dehydration etc. Follow-up with A. fib clinic  Coronary artery disease-denies recent episodes of arm neck back or chest discomfort.  Status post CABG x4. Continue metoprolol, aspirin, statin therapy Heart healthy low-sodium diet-salty 6 given Increase physical activity as tolerated  Chronic systolic and diastolic CHF-euvolemic today.  No increased DOE or activity intolerance.  Weight stable. Continue furosemide, potassium, metoprolol, metolazone, Entresto Heart healthy low-sodium diet-salty 6 given Increase physical activity as tolerated Daily weights-contact office with a weight increase of 3 pounds overnight or 5 pounds in 1 week. Lower extremity support stockings Elevate lower extremities when not active Fluid restriction 48 ounces Order BMP  Essential hypertension-BP today 110/52.  Well-controlled at home. Continue metoprolol, spironolactone Heart healthy low-sodium diet-salty 6 given Increase physical activity as tolerated  Hyperlipidemia-03/28/2021: Cholesterol 124; HDL 34.60; LDL Cholesterol 57; Triglycerides 161.0; VLDL 32.2 Continue atorvastatin Heart healthy low-sodium diet-salty 6 given Increase physical activity as tolerated  Disposition: Follow-up with Dr. Angelena Form or APP in 3-4 months and A. fib clinic as scheduled.       Medication Adjustments/Labs and Tests Ordered: Current medicines are reviewed at length with the patient today.  Concerns regarding medicines are outlined above.  No orders of the defined types were placed in this encounter.  No orders of the defined types were placed in this encounter.   There are no Patient Instructions on file for this visit.   Signed, Deberah Pelton, NP  06/15/2021 10:57 AM      Notice: This dictation was prepared with Dragon dictation along with smaller phrase technology. Any transcriptional errors  that result from this process are unintentional and may not be corrected upon review.  I spent 14 minutes examining this patient, reviewing medications, and using patient centered shared decision making involving her cardiac care.  Prior to her visit I spent greater than 20 minutes reviewing her past medical history,  medications, and prior cardiac tests.

## 2021-06-18 ENCOUNTER — Encounter (HOSPITAL_BASED_OUTPATIENT_CLINIC_OR_DEPARTMENT_OTHER): Payer: Self-pay | Admitting: General Practice

## 2021-06-18 ENCOUNTER — Ambulatory Visit (INDEPENDENT_AMBULATORY_CARE_PROVIDER_SITE_OTHER): Payer: Medicare Other

## 2021-06-18 ENCOUNTER — Other Ambulatory Visit: Payer: Self-pay

## 2021-06-18 ENCOUNTER — Ambulatory Visit (HOSPITAL_BASED_OUTPATIENT_CLINIC_OR_DEPARTMENT_OTHER): Payer: Medicare Other | Admitting: General Practice

## 2021-06-18 VITALS — BP 110/52 | HR 75 | Ht 66.0 in | Wt 278.4 lb

## 2021-06-18 DIAGNOSIS — E782 Mixed hyperlipidemia: Secondary | ICD-10-CM | POA: Diagnosis not present

## 2021-06-18 DIAGNOSIS — I10 Essential (primary) hypertension: Secondary | ICD-10-CM | POA: Diagnosis not present

## 2021-06-18 DIAGNOSIS — I5042 Chronic combined systolic (congestive) and diastolic (congestive) heart failure: Secondary | ICD-10-CM | POA: Diagnosis not present

## 2021-06-18 DIAGNOSIS — I2510 Atherosclerotic heart disease of native coronary artery without angina pectoris: Secondary | ICD-10-CM | POA: Diagnosis not present

## 2021-06-18 DIAGNOSIS — Z95810 Presence of automatic (implantable) cardiac defibrillator: Secondary | ICD-10-CM

## 2021-06-18 DIAGNOSIS — I480 Paroxysmal atrial fibrillation: Secondary | ICD-10-CM | POA: Diagnosis not present

## 2021-06-18 DIAGNOSIS — Z23 Encounter for immunization: Secondary | ICD-10-CM | POA: Diagnosis not present

## 2021-06-18 NOTE — Patient Instructions (Signed)
Medication Instructions:  No changes *If you need a refill on your cardiac medications before your next appointment, please call your pharmacy*   Lab Work: BMP today-3rd floor If you have labs (blood work) drawn today and your tests are completely normal, you will receive your results only by: [place] (if you have MyChart) OR A paper copy in the mail If you have any lab test that is abnormal or we need to change your treatment, we will call you to review the results.   Testing/Procedures: None today   Follow-Up: At [facility], you and your health needs are our priority.  As part of our continuing mission to provide you with exceptional heart care, we have created designated Provider Care Teams.  These Care Teams include your primary Cardiologist (physician) and Advanced Practice Providers (APPs -  Physician Assistants and Nurse Practitioners) who all work together to provide you with the care you need, when you need it.  We recommend signing up for the patient portal called "MyChart".  Sign up information is provided on this After Visit Summary.  MyChart is used to connect with patients for Virtual Visits (Telemedicine).  Patients are able to view lab/test results, encounter notes, upcoming appointments, etc.  Non-urgent messages can be sent to your provider as well.   To learn more about what you can do with MyChart, go to [email or web address].    Your next appointment:   4 month(s)  The format for your next appointment:   In Person  Provider:   You may see [name], MD or one of the following Advanced Practice Providers on your designated Care Team:   Melina Copa, PA-C Ermalinda Barrios, PA-C   Other Instructions Follow Salty 6 diet, increase activity as tolerated

## 2021-06-18 NOTE — Progress Notes (Signed)
EPIC Encounter for ICM Monitoring  Patient Name: Justin [name] is a 87 y.o. male Date: 06/18/2021 Primary Care Physican: Laurey Morale, MD Primary Cardiologist: Angelena Form Electrophysiologist: Vergie Living Pacing: 56.6% Effective 53.9% (was 85.1 % on 11/14/2020 report) 04/11/2021 Weight: 273 lbs 04/17/2021 Weight: 278 lbs 05/15/2021 Weight: 274 lbs 06/18/2021 Weight: 272 lbs   Since 02-Jun-2021 Time in AT/AF  (0.0%)                                                            Spoke with patient and heart failure questions reviewed.  Pt continues to have ankle swelling despite taking Metolazone 3 times a week and will discuss with Denyse Amass today at [facility]. symptoms with Metolazone?   Optivol thoracic impedance suggesting normal fluid levels.     Prescribed: Furosemide 80 mg take 1 tablet every day.  KLC 20 mEq take 2 tablets every other day alternating with 1 tablet every other day. Metolazone 2.5 mg Take 1 tablet by mouth 3 days per week Spironolactone 25 mg take 1 tablet daily   Labs: 04/19/2021 Creatinine 1.07, BUN 25, Potassium 4.8, Sodium 135, GFR >60 03/28/2021 Creatinine 1.24, BUN 31, Potassium 4.7, Sodium 135, GFR 53.29 11/14/2020 Creatinine 1.05, BUN 25, Potassium 4.6, Sodium 137, GFR 65.23 A complete set of results can be found in Results Review.   Recommendations:  No changes and encouraged to call if experiencing any fluid symptoms.   Follow-up plan: ICM clinic phone appointment on 07/30/2021.   91 day device clinic remote transmission 07/09/2021.     EP/Cardiology Office Visits:  06/18/2021 with Coletta Memos, NP.  08/23/2021 with Dr [name]   Copy of ICM check sent to Dr. [name].    3 month ICM trend: 06/18/2021.    1 Year ICM trend:       [name], RN 06/18/2021 8:39 AM

## 2021-06-19 ENCOUNTER — Other Ambulatory Visit: Payer: Self-pay

## 2021-06-19 DIAGNOSIS — I5020 Unspecified systolic (congestive) heart failure: Secondary | ICD-10-CM

## 2021-06-19 DIAGNOSIS — I5042 Chronic combined systolic (congestive) and diastolic (congestive) heart failure: Secondary | ICD-10-CM

## 2021-06-19 DIAGNOSIS — Z79899 Other long term (current) drug therapy: Secondary | ICD-10-CM

## 2021-06-19 LAB — BASIC METABOLIC PANEL
BUN/Creatinine Ratio: 25 — ABNORMAL HIGH (ref 10–24)
BUN: 33 mg/dL — ABNORMAL HIGH (ref 8–27)
CO2: 21 mmol/L (ref 20–29)
Calcium: 8.5 mg/dL — ABNORMAL LOW (ref 8.6–10.2)
Chloride: 103 mmol/L (ref 96–106)
Creatinine, Ser: 1.32 mg/dL — ABNORMAL HIGH (ref 0.76–1.27)
Glucose: 125 mg/dL — ABNORMAL HIGH (ref 70–99)
Potassium: 5 mmol/L (ref 3.5–5.2)
Sodium: 136 mmol/L (ref 134–144)
eGFR: 53 mL/min/{1.73_m2} — ABNORMAL LOW (ref >59)

## 2021-06-19 MED ORDER — METOLAZONE 2.5 MG PO TABS: ORAL_TABLET | ORAL | 3 refills | Status: DC

## 2021-06-25 DIAGNOSIS — H353221 Exudative age-related macular degeneration, left eye, with active choroidal neovascularization: Secondary | ICD-10-CM | POA: Diagnosis not present

## 2021-06-28 ENCOUNTER — Other Ambulatory Visit: Payer: Self-pay | Admitting: General Practice

## 2021-06-28 DIAGNOSIS — I5042 Chronic combined systolic (congestive) and diastolic (congestive) heart failure: Secondary | ICD-10-CM | POA: Diagnosis not present

## 2021-06-28 DIAGNOSIS — Z79899 Other long term (current) drug therapy: Secondary | ICD-10-CM | POA: Diagnosis not present

## 2021-06-28 DIAGNOSIS — I5020 Unspecified systolic (congestive) heart failure: Secondary | ICD-10-CM | POA: Diagnosis not present

## 2021-06-29 ENCOUNTER — Other Ambulatory Visit: Payer: Self-pay

## 2021-06-29 DIAGNOSIS — Z79899 Other long term (current) drug therapy: Secondary | ICD-10-CM

## 2021-06-29 DIAGNOSIS — E782 Mixed hyperlipidemia: Secondary | ICD-10-CM

## 2021-06-29 LAB — BASIC METABOLIC PANEL
BUN/Creatinine Ratio: 23 (ref 10–24)
BUN: 32 mg/dL — ABNORMAL HIGH (ref 8–27)
CO2: 19 mmol/L — ABNORMAL LOW (ref 20–29)
Calcium: 8.6 mg/dL (ref 8.6–10.2)
Chloride: 104 mmol/L (ref 96–106)
Creatinine, Ser: 1.39 mg/dL — ABNORMAL HIGH (ref 0.76–1.27)
Glucose: 121 mg/dL — ABNORMAL HIGH (ref 70–99)
Potassium: 5 mmol/L (ref 3.5–5.2)
Sodium: 139 mmol/L (ref 134–144)
eGFR: 50 mL/min/{1.73_m2} — ABNORMAL LOW (ref >59)

## 2021-07-09 ENCOUNTER — Ambulatory Visit (INDEPENDENT_AMBULATORY_CARE_PROVIDER_SITE_OTHER): Payer: Medicare Other

## 2021-07-09 ENCOUNTER — Other Ambulatory Visit: Payer: Self-pay | Admitting: General Practice

## 2021-07-09 DIAGNOSIS — E782 Mixed hyperlipidemia: Secondary | ICD-10-CM | POA: Diagnosis not present

## 2021-07-09 DIAGNOSIS — I480 Paroxysmal atrial fibrillation: Secondary | ICD-10-CM | POA: Diagnosis not present

## 2021-07-09 DIAGNOSIS — Z79899 Other long term (current) drug therapy: Secondary | ICD-10-CM | POA: Diagnosis not present

## 2021-07-10 ENCOUNTER — Telehealth: Payer: Self-pay

## 2021-07-10 LAB — CUP PACEART REMOTE DEVICE CHECK
Battery Remaining Longevity: 17 mo
Battery Voltage: 2.92 V
Brady Statistic AP VP Percent: 9.29 %
Brady Statistic AP VS Percent: 1.12 %
Brady Statistic AS VP Percent: 75.19 %
Brady Statistic AS VS Percent: 14.4 %
Brady Statistic RA Percent Paced: 9.02 %
Brady Statistic RV Percent Paced: 50.82 %
Date Time Interrogation Session: 20221114073729
HighPow Impedance: 75 Ohm
Implantable Lead Implant Date: 20180907
Implantable Lead Implant Date: 20180907
Implantable Lead Implant Date: 20180907
Implantable Lead Location: 753858
Implantable Lead Location: 753859
Implantable Lead Location: 753860
Implantable Lead Model: 4396
Implantable Lead Model: 5076
Implantable Pulse Generator Implant Date: 20180907
Lead Channel Impedance Value: 304 Ohm
Lead Channel Impedance Value: 342 Ohm
Lead Channel Impedance Value: 380 Ohm
Lead Channel Impedance Value: 437 Ohm
Lead Channel Impedance Value: 494 Ohm
Lead Channel Impedance Value: 817 Ohm
Lead Channel Pacing Threshold Amplitude: 0.75 V
Lead Channel Pacing Threshold Amplitude: 0.875 V
Lead Channel Pacing Threshold Amplitude: 1.125 V
Lead Channel Pacing Threshold Pulse Width: 0.4 ms
Lead Channel Pacing Threshold Pulse Width: 0.4 ms
Lead Channel Pacing Threshold Pulse Width: 1 ms
Lead Channel Sensing Intrinsic Amplitude: 1.75 mV
Lead Channel Sensing Intrinsic Amplitude: 1.75 mV
Lead Channel Sensing Intrinsic Amplitude: 9.25 mV
Lead Channel Sensing Intrinsic Amplitude: 9.25 mV
Lead Channel Setting Pacing Amplitude: 1.5 V
Lead Channel Setting Pacing Amplitude: 2 V
Lead Channel Setting Pacing Amplitude: 2.5 V
Lead Channel Setting Pacing Pulse Width: 0.4 ms
Lead Channel Setting Pacing Pulse Width: 1 ms
Lead Channel Setting Sensing Sensitivity: 0.3 mV

## 2021-07-10 LAB — BASIC METABOLIC PANEL
BUN/Creatinine Ratio: 20 (ref 10–24)
BUN: 22 mg/dL (ref 8–27)
CO2: 23 mmol/L (ref 20–29)
Calcium: 8.4 mg/dL — ABNORMAL LOW (ref 8.6–10.2)
Chloride: 101 mmol/L (ref 96–106)
Creatinine, Ser: 1.08 mg/dL (ref 0.76–1.27)
Glucose: 86 mg/dL (ref 70–99)
Potassium: 4.7 mmol/L (ref 3.5–5.2)
Sodium: 137 mmol/L (ref 134–144)
eGFR: 67 mL/min/{1.73_m2} (ref >59)

## 2021-07-10 NOTE — Telephone Encounter (Signed)
Successful telephone encounter to patient's daughter (on [place]) to answer question. Daughter states patient attempted to explain today's phone call with device RN however he was unsure of his heart rhythm. Discussed remote transmission findings with daughter and informed that as of now it was watchful waiting. Daughter is more concerned about elevated optivol. States patient has been eating take out meals. She will assist patient with managing sodium intake in the future. Daughter appreciative of information and explanation.

## 2021-07-10 NOTE — Telephone Encounter (Signed)
Patient's daughter calling back to discuss the patient's recent transmission.

## 2021-07-10 NOTE — Telephone Encounter (Signed)
Unscheduled manual transmission received. Follow up as scheduled Optivol below threshold Effective CRT 66.2%, VSE 24min/day, markers show PVC's, higher rates PVC's 342/hr, runs 79.8/hr, no comparison data DCCV 8/30, Metoprolol, Pradaxa prescribed Route for review  Successful telephone encounter to patient to follow up on medication compliance and S/S of frequent PVCs and increased burden. Patient denies symptoms. Admits to medication compliance including entresto, imdur, pradaxa, and toprol as prescribed. States he has been under a lot of stress recently related to his wifes back pain and assisting her with needed comfort. Wife to have back surgery tomorrow and patient feels his stress will lessen once patient is pain free and able to do more in the home.   Reviewed with DOD, Dr. Lovena Le. No new orders received. Continue to monitor. Scheduled remote previously exported to Dr. [name] for review. Will continue to monitor.

## 2021-07-11 ENCOUNTER — Encounter (HOSPITAL_BASED_OUTPATIENT_CLINIC_OR_DEPARTMENT_OTHER): Payer: Self-pay

## 2021-07-11 NOTE — Progress Notes (Signed)
Pt. Results shared via mychart

## 2021-07-17 ENCOUNTER — Encounter: Payer: Medicare Other | Admitting: Internal Medicine

## 2021-07-17 ENCOUNTER — Telehealth: Payer: Self-pay | Admitting: Pharmacist

## 2021-07-17 NOTE — Chronic Care Management (AMB) (Signed)
    Chronic Care Management Pharmacy Assistant   Name: Justin [name]  MRN: [id number] DOB: 1934/05/16  Reason for Encounter: Check on patients levothyroxine status and dry heaves.  Spoke with patient, he states he is taking levothyroxine without any issues, the dry heaves have cleared up.  Care Gaps: AWV - completed on 03/22/21 Covid-19 vaccine booster 4 - overdue since 2021 Flu vaccine - due  Star Rating Drugs: Atorvastatin 20mg  - last filled on 04/26/21 90DS at [facility] Pharmacist Assistant [phone]

## 2021-07-17 NOTE — Progress Notes (Signed)
Remote ICD transmission.   

## 2021-07-23 ENCOUNTER — Other Ambulatory Visit: Payer: Self-pay | Admitting: Critical Care Medicine

## 2021-07-23 ENCOUNTER — Other Ambulatory Visit: Payer: Self-pay | Admitting: Cardiovascular Disease

## 2021-07-23 ENCOUNTER — Other Ambulatory Visit: Payer: Self-pay | Admitting: Family Medicine

## 2021-07-23 DIAGNOSIS — R059 Cough, unspecified: Secondary | ICD-10-CM

## 2021-07-25 ENCOUNTER — Other Ambulatory Visit: Payer: Self-pay | Admitting: Critical Care Medicine

## 2021-07-30 ENCOUNTER — Ambulatory Visit (INDEPENDENT_AMBULATORY_CARE_PROVIDER_SITE_OTHER): Payer: Medicare Other

## 2021-07-30 DIAGNOSIS — I5042 Chronic combined systolic (congestive) and diastolic (congestive) heart failure: Secondary | ICD-10-CM | POA: Diagnosis not present

## 2021-07-30 DIAGNOSIS — Z95810 Presence of automatic (implantable) cardiac defibrillator: Secondary | ICD-10-CM

## 2021-07-31 NOTE — Progress Notes (Signed)
EPIC Encounter for ICM Monitoring  Patient Name: Justin [name] is a 87 y.o. male Date: 07/31/2021 Primary Care Physican: Laurey Morale, MD Primary Cardiologist: Angelena Form Electrophysiologist: Vergie Living Pacing: 54.6% Effective 51.8% (was 85.1 % on 11/14/2020 report) 06/18/2021 Weight: 272 lbs   Since 10-Jul-2021 Time in AT/AF  (0.0%)                                                            Spoke with patient and heart failure questions reviewed.  He reports feeling better since fluid accumulation is resolving.  Metolazone was discontinued at last OV with Coletta Memos, NP and advised to limit fluid intake to 48 ounces daily.    Optivol thoracic impedance suggesting fluid levels returned to normal.  Message sent to device clinic triage 12/6 to review BiV Pacing.   Prescribed: Furosemide 80 mg take 1 tablet every day.  KLC 20 mEq Take 2 tablets (40 mEq total) by mouth daily. Take 2 tablets daily alternating with 1 tablet every other day Spironolactone 25 mg take 1 tablet daily   Labs: 07/09/2021 Creatinine 1.08, BUN 22, Potassium 4.7, Sodium 137, GFR 67 06/28/2021 Creatinine 1.39, BUN 32, Potassium 5.0, Sodium 139, GFR 50  06/18/2021 Creatinine 1.32, BUN 33, Potassium 5.0, Sodium 136, GFR 53  04/19/2021 Creatinine 1.07, BUN 25, Potassium 4.8, Sodium 135, GFR >60 03/28/2021 Creatinine 1.24, BUN 31, Potassium 4.7, Sodium 135, GFR 53.29 11/14/2020 Creatinine 1.05, BUN 25, Potassium 4.6, Sodium 137, GFR 65.23 A complete set of results can be found in Results Review.   Recommendations:  No changes and encouraged to call if experiencing any fluid symptoms.   Follow-up plan: ICM clinic phone appointment on 09/03/2021.   91 day device clinic remote transmission 10/07/2021.     EP/Cardiology Office Visits:    08/23/2021 with Dr [name]   Copy of ICM check sent to Dr. [name].    3 month ICM trend: 07/30/2021.    12-14 Month ICM trend:       [name], RN 07/31/2021 1:58  PM

## 2021-08-01 ENCOUNTER — Other Ambulatory Visit: Payer: Self-pay

## 2021-08-07 ENCOUNTER — Other Ambulatory Visit: Payer: Self-pay | Admitting: Family Medicine

## 2021-08-09 ENCOUNTER — Telehealth: Payer: Self-pay | Admitting: Pharmacist

## 2021-08-09 NOTE — Chronic Care Management (AMB) (Signed)
° ° °  Chronic Care Management Pharmacy Assistant   Name: Justin [name]  MRN: [id number] DOB: November 23, 1933  08/13/2021 APPOINTMENT REMINDER  [name] was reminded to have all medications, supplements and any blood glucose and blood pressure readings available for review with [name], Pharm. D, at his telephone visit on 08/13/2021 at 3:00.   Questions: Have you had any recent office visit or specialist visit outside of [place]? Patient has not been seen outside of [facility].  Are there any concerns you would like to discuss during your office visit? Patient denies any concerns at this time.  Are you having any problems obtaining your medications? (Whether it pharmacy issues or cost) Patient denies any issues obtaining medications.  If patient has any PAP medications ask if they are having any problems getting their PAP medication or refill? Patient is not getting any medications from PAP  Care Gaps: AWV - completed on 03/22/21 Covid-19 vaccine booster 4 - overdue since 2021 Flu vaccine - due  Star Rating Drug: Atorvastatin 20mg  - last filled on 09/22/2020 90DS at [facility]  Any gaps in medications fill history? No  [name] [place]  Catering manager [phone]

## 2021-08-13 ENCOUNTER — Ambulatory Visit (INDEPENDENT_AMBULATORY_CARE_PROVIDER_SITE_OTHER): Payer: Medicare Other | Admitting: Pharmacist

## 2021-08-13 DIAGNOSIS — I5033 Acute on chronic diastolic (congestive) heart failure: Secondary | ICD-10-CM

## 2021-08-13 DIAGNOSIS — I10 Essential (primary) hypertension: Secondary | ICD-10-CM

## 2021-08-13 NOTE — Patient Instructions (Addendum)
Hi [name],  It was great to catch up with you! I will reach out to you once I have a chance to talk to Dr. Sarajane Jews and what I can find out about the [facility] patient assistance.  Please reach out to me if you have any questions or need anything before our follow up!  Best, Maddie  [name], PharmD, [place] [facility] [place]   Visit Information   Goals Addressed   None    Patient Care Plan: CCM Pharmacy Care Plan     Problem Identified: Problem: Hypertension, Hyperlipidemia, Heart Failure, Coronary Artery Disease, and Allergic Rhinitis      Long-Range Goal: Patient-Specific Goal   Start Date: 02/14/2021  Expected End Date: 02/14/2022  Recent Progress: On track  Priority: High  Note:   Current Barriers:  Unable to independently afford treatment regimen Unable to independently monitor therapeutic efficacy  Pharmacist Clinical Goal(s):  Patient will verbalize ability to afford treatment regimen achieve adherence to monitoring guidelines and medication adherence to achieve therapeutic efficacy through collaboration with PharmD and provider.   Interventions: 1:1 collaboration with Laurey Morale, MD regarding development and update of comprehensive plan of care as evidenced by provider attestation and co-signature Inter-disciplinary care team collaboration (see longitudinal plan of care) Comprehensive medication review performed; medication list updated in electronic medical record  Hypertension (BP goal <130/80) -Controlled -Current treatment: Furosemide 80 mg 1 tablet daily (alternating between 0.5 and 1 tablet daily)  Isosorbide mononitrate 30 mg 1 tablet daily Metoprolol succinate 50 mg 1.5 tablet daily with or immediately following a meal Spironolactone 25 mg 1 tablet daily -Medications previously tried: losartan  -Current home readings: 117/61 (not checking consistently) -Current dietary habits: did not discuss -Current  exercise habits: was walking some in [employer] -Denies hypotensive/hypertensive symptoms -Educated on Importance of home blood pressure monitoring; Proper BP monitoring technique; Symptoms of hypotension and importance of maintaining adequate hydration; -Counseled to monitor BP at home weekly, document, and provide log at future appointments -Counseled on diet and exercise extensively Recommended to continue current medication  Hyperlipidemia: (LDL goal < 70) -Controlled -Current treatment: Atorvastatin 20 mg 1 tablet daily -Medications previously tried: none  -Current dietary patterns: did not discuss -Current exercise habits: did not discuss -Educated on Cholesterol goals;  Importance of limiting foods high in cholesterol; Exercise goal of 150 minutes per week; -Counseled on diet and exercise extensively Recommended to continue current medication  CAD (Goal: prevent heart events) -Controlled -Current treatment  Aspirin 81 mg 1 tablet daily Pradaxa 150 mg 1 capsule twice daily -Medications previously tried: n/a  -Recommended to continue current medication  Heart Failure (Goal: manage symptoms and prevent exacerbations) -Controlled -Last ejection fraction: 30-35% (Date: 04/2019) -HF type: Combined Systolic and Diastolic -NYHA Class: III (marked limitation of activity) -AHA HF Stage: C (Heart disease and symptoms present) -Current treatment: Isosorbide mononitrate 30 mg 1 tablet daily Furosemide 80 mg 1 tablet daily (alternating between 0.5 and 1 tablet daily)  Metoprolol succinate 50 mg 1.5 tablets by mouth daily with or immediately following a meal Entresto 97-103 mg 1 tablet twice daily  Spironolactone 25 mg 1 tablet daily -Medications previously tried: losartan  -Current home BP/HR readings: not checking regularly -Current dietary habits: did not discuss -Current exercise habits: does have an [employer] membership but plans to activate in January -Educated on Benefits  of medications for managing symptoms and prolonging life Importance of weighing daily; if you gain more than 3 pounds in one  day or 5 pounds in one week, call cardiologist -Counseled on diet and exercise extensively Recommended to continue current medication  Diabetes (A1c goal <7%) -Controlled -Current medications: No medications -Medications previously tried: none  -Current home glucose readings fasting glucose: does not need to check post prandial glucose: does not need to check -Denies hypoglycemic/hyperglycemic symptoms -Current meal patterns:  breakfast: did not discuss  lunch: did not discuss  dinner: did not discuss snacks: did not discuss drinks: did not discuss -Current exercise: did not discuss -Educated on A1c and blood sugar goals; -Counseled to check feet daily and get yearly eye exams -Counseled on diet and exercise extensively Recommended repeat A1c  GERD (Goal: minimize symptoms) -Controlled -Current treatment  Pepcid Complete (Famotidine + Calcium + Magnesium) 2 tablets at bedtime Omeprazole 20 mg 1 capsule twice daily Probiotic daily -Medications previously tried: n/a  -Recommended to continue current medication  Hypothyroidism (Goal: TSH 0.35-4.5) -Controlled -Current treatment  Levothyroxine 100 mcg 1 tablet daily -Medications previously tried: none -Recommended to continue current medication  Allergic rhinitis (Goal: minimize symptoms) -Not ideally controlled -Current treatment  Azelastine 0.15% 2 sprays into the nose in the morning and at bedtime Xyzal 5 mg 1 tablet daily (in AM) Flonase 2 sprays into both nostrils daily Montelukast 10 mg 1 tablet in the evening Benadryl 25 mg 1 tablet at bedtime Cetirizine 10 mg - 1/2 tablet at bedtime Benzonatate 200 mg 1 capsule every 8 hrs as needed for cough -Medications previously tried: n/a  -Recommended limiting use of Benadryl and caution with using 2 antihistamines (cetirizine and Xyzal)  Gout  (Goal: prevent flare ups) -Controlled -Current treatment  Colchicine 0.6 mg tablet daily - as needed -Medications previously tried: n/a  -Recommended to continue current medication Recommended allpurinol to lower uric acid level.  Health Maintenance -Vaccine gaps: COVID booster -Current therapy:  Triamcinolone 0.1% cream once daily as needed for dry skin Valtrex 500 mg 1 tablet twice daily as needed for cold sores -Educated on Cost vs benefit of each product must be carefully weighed by individual consumer -Patient is satisfied with current therapy and denies issues -Recommended to continue current medication  Patient Goals/Self-Care Activities Patient will:  - take medications as prescribed check blood pressure weekly, document, and provide at future appointments  Follow Up Plan: Telephone follow up appointment with care management team member scheduled for: 6 months       Patient verbalizes understanding of instructions provided today and agrees to view in [place].  The pharmacy team will reach out to the patient again over the next 30 days.   Viona Gilmore, [facility]

## 2021-08-13 NOTE — Progress Notes (Signed)
Chronic Care Management Pharmacy Note  08/13/2021 Name:  Justin [name] MRN:  [id number] DOB:  01-10-1934  Summary: Uric acid is above goal of < 6 Patient has had recent lower BP readings in office  Recommendations/Changes made from today's visit: -Recommended for patient to monitor BP at home regularly -Recommend allopurinol for uric acid lowering  Plan: -Follow up on PAP for Entresto and remail application  -Follow up BP assessment in 2 months   Subjective: Justin [name] is an 87 y.o. year old male who is a primary patient of Laurey Morale, MD.  The CCM team was consulted for assistance with disease management and care coordination needs.    Engaged with patient by telephone for follow up visit in response to provider referral for pharmacy case management and/or care coordination services.   Consent to Services:  The patient was given information about Chronic Care Management services, agreed to services, and gave verbal consent prior to initiation of services.  Please see initial visit note for detailed documentation.   Patient Care Team: Laurey Morale, MD as PCP - General Angelena Form [name], MD as PCP - Cardiology (Cardiology) Festus Aloe, MD as Consulting Physician (Urology) Viona Gilmore, [facility] as Pharmacist (Pharmacist)  Recent office visits: 03/27/21 [name] MD (PCP) - seen for systolic heart failure and other chronic conditions. Uric acid elevated. No medication changes. Follow up with Dr. Angelena Form.  03/22/21 Randel Pigg, LPN: Patient presented for AWV.   Recent consult visits: 06/18/21 Coletta Memos, NP (cardiology): Patient presented for Afib follow up. Prescribed furosemide 20 mg daily. Decreased metolazone to twice weekly.  04/20/21 [name] MD (Cardiology) - seen for CAD and other issues. Changed furosemide 80mg  from one tablet daily alternating with a half a tablet every other day to 1 tablet daily. Changed metolazone 2.5mg  to  3 days per week. Follow up in 12 weeks.    04/19/21 Malka So MD (Cardiology) - seen for persistent A-FIB. No medication changes. Follow up with Dr. [name], Dr. Angelena Form and Coletta Memos as scheduled.   11/03/20 Meredith Pel MD (Orthopedic Surgery) - seen for rib pain on right side. Patient started on methocarbamol 500mg  and oxycodone 5mg . Follow up if symptoms worsen or dont improve.  Hospital visits: Hospital visit on 04/24/21 for cardioversion performed by [name] MD.    Objective:  Lab Results  Component Value Date   CREATININE 1.08 07/09/2021   BUN 22 07/09/2021   GFR 53.29 (L) 03/28/2021   GFRNONAA >60 04/19/2021   GFRAA 66 04/04/2020   NA 137 07/09/2021   K 4.7 07/09/2021   CALCIUM 8.4 (L) 07/09/2021   CO2 23 07/09/2021   GLUCOSE 86 07/09/2021    Lab Results  Component Value Date/Time   HGBA1C 6.6 (H) 03/28/2021 07:30 AM   HGBA1C 6.1 (H) 03/21/2020 09:43 AM   GFR 53.29 (L) 03/28/2021 07:30 AM   GFR 65.23 11/14/2020 01:13 PM    Last diabetic Eye exam: No results found for: HMDIABEYEEXA  Last diabetic Foot exam: No results found for: HMDIABFOOTEX   Lab Results  Component Value Date   CHOL 124 03/28/2021   HDL 34.60 (L) 03/28/2021   LDLCALC 57 03/28/2021   TRIG 161.0 (H) 03/28/2021   CHOLHDL 4 03/28/2021    Hepatic Function Latest Ref Rng & Units 03/28/2021 11/14/2020 03/21/2020  Total Protein 6.0 - 8.3 g/dL 6.8 6.2 6.5  Albumin 3.5 - 5.2 g/dL 3.5 3.5 -  AST 0 - 37 U/L 17  16 21  ALT 0 - 53 U/L $Remo'16 12 18  'pHLSx$ Alk Phosphatase 39 - 117 U/L 82 115 -  Total Bilirubin 0.2 - 1.2 mg/dL 0.8 0.8 0.9  Bilirubin, Direct 0.0 - 0.3 mg/dL 0.2 0.2 0.2    Lab Results  Component Value Date/Time   TSH 5.39 03/28/2021 07:30 AM   TSH 2.80 11/14/2020 01:13 PM   FREET4 0.82 03/28/2021 07:30 AM   FREET4 0.99 11/14/2020 01:13 PM    CBC Latest Ref Rng & Units 04/19/2021 03/28/2021 11/14/2020  WBC 4.0 - 10.5 K/uL 10.9(H) 10.1 10.4  Hemoglobin 13.0 - 17.0 g/dL 13.0 13.8 12.8(L)   Hematocrit 39.0 - 52.0 % 41.8 42.8 38.7(L)  Platelets 150 - 400 K/uL 241 220.0 223.0    Lab Results  Component Value Date/Time   VD25OH 33 12/12/2008 11:27 PM    Clinical ASCVD: Yes  The ASCVD Risk score (Arnett DK, et al., 2019) failed to calculate for the following reasons:   The 2019 ASCVD risk score is only valid for ages 67 to 64   The patient has a prior MI or stroke diagnosis    Depression screen [facility] 2/9 03/22/2021 03/22/2021 03/11/2019  Decreased Interest 0 0 0  Down, Depressed, Hopeless 0 0 0  PHQ - 2 Score 0 0 0  Some recent data might be hidden     Social History   Tobacco Use  Smoking Status Former   Packs/day: 3.50   Years: 13.00   Pack years: 45.50   Types: Cigarettes   Quit date: 1963   Years since quitting: 60.0  Smokeless Tobacco Never  Tobacco Comments   Former smoker 04/19/21   BP Readings from Last 3 Encounters:  06/18/21 (!) 110/52  04/24/21 (!) 99/47  04/20/21 108/60   Pulse Readings from Last 3 Encounters:  06/18/21 75  04/24/21 73  04/20/21 (!) 56   Wt Readings from Last 3 Encounters:  06/18/21 278 lb 6.4 oz (126.3 kg)  04/24/21 264 lb 6.4 oz (119.9 kg)  04/20/21 280 lb 9.6 oz (127.3 kg)   BMI Readings from Last 3 Encounters:  06/18/21 44.93 kg/m  04/24/21 42.68 kg/m  04/20/21 45.29 kg/m    Assessment/Interventions: Review of patient past medical history, allergies, medications, health status, including review of consultants reports, laboratory and other test data, was performed as part of comprehensive evaluation and provision of chronic care management services.   SDOH:  (Social Determinants of Health) assessments and interventions performed: Yes   SDOH Screenings   Alcohol Screen: Low Risk    Last Alcohol Screening Score (AUDIT): 1  Depression (PHQ2-9): Low Risk    PHQ-2 Score: 0  Financial Resource Strain: Low Risk    Difficulty of Paying Living Expenses: Not very hard  Food Insecurity: No Food Insecurity   Worried  About Charity fundraiser in the Last Year: Never true   Ran Out of Food in the Last Year: Never true  Housing: Low Risk    Last Housing Risk Score: 0  Physical Activity: Insufficiently Active   Days of Exercise per Week: 4 days   Minutes of Exercise per Session: 30 min  Social Connections: Moderately Isolated   Frequency of Communication with Friends and Family: Three times a week   Frequency of Social Gatherings with Friends and Family: Three times a week   Attends Religious Services: Never   Active Member of Clubs or Organizations: No   Attends Archivist Meetings: Never   Marital Status: Married  Stress: No Stress Concern Present   Feeling of Stress : Not at all  Tobacco Use: Medium Risk   Smoking Tobacco Use: Former   Smokeless Tobacco Use: Never   Passive Exposure: Not on file  Transportation Needs: No Transportation Needs   Lack of Transportation (Medical): No   Lack of Transportation (Non-Medical): No    CCM Care Plan  Allergies  Allergen Reactions   Peanut-Containing Drug Products Anaphylaxis   Sulfonamide Derivatives Anaphylaxis   Amlodipine Swelling    Swelling in ankles   Eliquis [Apixaban] Other (See Comments)    Back/hip pain   Lisinopril Cough    cough   Xarelto [Rivaroxaban] Other (See Comments)    Back/hip pain    Medications Reviewed Today     Reviewed by Deberah Pelton, NP (Nurse Practitioner) on 06/18/21 at 1437  Med List Status: <None>   Medication Order Taking? Sig Documenting Provider Last Dose Status Informant  aspirin 81 MG tablet [id number] No Take 1 tablet (81 mg total) by mouth daily. Rosalin Hawking 04/24/2021 730 Active Spouse/Significant Other  atorvastatin (LIPITOR) 20 MG tablet [id number]  TAKE 1 TABLET ONCE DAILY. Laurey Morale, MD  Active   Azelastine HCl 0.15 % SOLN [id number] No Place 2 sprays into the nose in the morning and at bedtime. Julian Hy, DO 04/24/2021 Active Spouse/Significant Other  benzonatate  (TESSALON) 200 MG capsule [id number] No Take 1 capsule (200 mg total) by mouth 2 (two) times daily as needed for cough. Julian Hy, DO 04/24/2021 730 Active Spouse/Significant Other  colchicine 0.6 MG tablet [id number] No Take 1 tablet (0.6 mg total) by mouth daily. Magnant, Gerrianne Scale, PA-C More than a month Active Spouse/Significant Other  dabigatran (PRADAXA) 150 MG CAPS capsule [id number] No TAKE (1) CAPSULE TWICE DAILY. Eulas Post, MD 04/24/2021 730 Active Spouse/Significant Other  diphenhydrAMINE (BENADRYL) 25 MG tablet [id number] No Take 25 mg by mouth at bedtime.  [provider] 04/23/2021 Active Spouse/Significant Other  ENTRESTO 97-103 MG [id number] No TAKE 1 TABLET BY MOUTH TWICE DAILY. Burnell Blanks, MD 04/24/2021 730 Active Spouse/Significant Other  EPINEPHrine 0.3 mg/0.3 mL IJ SOAJ injection [id number] No Inject 0.3 mLs (0.3 mg total) into the muscle as needed for anaphylaxis. Kennith Gain, MD Unknown Active Spouse/Significant Other  famotidine (PEPCID) 20 MG tablet [id number] No Take 1 tablet (20 mg total) by mouth at bedtime. Laurey Morale, MD 04/23/2021 Active Spouse/Significant Other  fluorouracil (EFUDEX) 5 % cream [id number] No Apply 1 application topically 2 (two) times daily as needed (rash). [provider] Taking Active Spouse/Significant Other  furosemide (LASIX) 80 MG tablet [id number] No Take 1 tablet (80 mg total) by mouth daily. Burnell Blanks, MD 04/23/2021 Active   hydrOXYzine (ATARAX/VISTARIL) 10 MG tablet [id number] No Take 10 mg by mouth at bedtime. [provider] More than a month Active Spouse/Significant Other  isosorbide mononitrate (IMDUR) 30 MG 24 hr tablet [id number]  TAKE 1 TABLET ONCE DAILY. Laurey Morale, MD  Active   ketotifen (ZADITOR) 0.025 % ophthalmic solution [id number] No Apply 1 drop to eye daily as needed (itchy eyes).  [provider] 04/24/2021 Active Spouse/Significant Other   levocetirizine (XYZAL) 5 MG tablet [id number] No Take 5 mg by mouth daily. [provider] 04/24/2021 730 Active Spouse/Significant Other  levothyroxine (SYNTHROID) 100 MCG tablet [id number] No Take 1 tablet (100 mcg total) by mouth daily. Laurey Morale, MD 04/24/2021 730 Active Spouse/Significant Other  meclizine (ANTIVERT) 25 MG tablet  [id number] No Take 1 tablet (25 mg total) by mouth every 4 (four) hours as needed for dizziness. Laurey Morale, MD More than a month Active Spouse/Significant Other  methocarbamol (ROBAXIN) 500 MG tablet [id number] No Take 1 tablet (500 mg total) by mouth every 8 (eight) hours as needed for muscle spasms. Meredith Pel, MD More than a month Active Spouse/Significant Other  metoCLOPramide (REGLAN) 10 MG tablet [id number]  TAKE ONE TABLET EVERY MORNING WITH BREAKFAST Laurey Morale, MD  Active   metolazone (ZAROXOLYN) 2.5 MG tablet [id number] No Take 1 tablet by mouth 3 days per week Burnell Blanks, MD 04/23/2021 Active   metoprolol succinate (TOPROL-XL) 50 MG 24 hr tablet [id number] No Take 1.5 tablets (75 mg total) by mouth daily. Take with or immediately following a meal. Burnell Blanks, MD 04/24/2021 730 Active Spouse/Significant Other  montelukast (SINGULAIR) 10 MG tablet [id number] No TAKE 1 TABLET ONCE DAILY IN THE EVENING. Julian Hy, DO 04/24/2021 730 Active Spouse/Significant Other  Multiple Vitamins-Minerals (PRESERVISION AREDS PO) [id number] No Take 2 tablets by mouth every morning. [provider] 04/24/2021 730 Active Spouse/Significant Other  omeprazole (PRILOSEC) 20 MG capsule [id number] No TAKE (1) CAPSULE TWICE DAILY. Laurey Morale, MD 04/24/2021 730 Active Spouse/Significant Other  oxyCODONE (OXY IR/ROXICODONE) 5 MG immediate release tablet [id number] No Take 1 tablet (5 mg total) by mouth every 12 (twelve) hours as needed for severe pain. Meredith Pel, MD More than a month Active Spouse/Significant Other  potassium  bicarbonate (K-LYTE) 25 MEQ disintegrating tablet [id number] No Take 25 mEq by mouth 2 (two) times daily. [provider] 04/23/2021 Active Spouse/Significant Other  potassium chloride SA (KLOR-CON) 20 MEQ tablet [id number]  Take 2 tablets (40 mEq total) by mouth daily. Take 2 tablets daily alternating with 1 tablet every other day Burnell Blanks, MD  Active   spironolactone (ALDACTONE) 25 MG tablet [id number] No TAKE 1 TABLET ONCE DAILY. Burnell Blanks, MD 04/23/2021 Active Spouse/Significant Other  triamcinolone cream (KENALOG) 0.1 % [id number] No Apply 1 application topically daily as needed for dry skin. [provider] 04/23/2021 Active Spouse/Significant Other           Med Note Nat Christen   Fri Oct 09, 2018 12:40 PM)    valACYclovir (VALTREX) 500 MG tablet [id number] No Take 500 mg by mouth 2 (two) times daily as needed (cold sores). 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   Severe aortic valve stenosis 03/04/2017   Hypokalemia due to loss of potassium with diuresis 01/10/2017   Aortic stenosis 72/04/4708   Systolic heart failure ([place]) 01/10/2017   Acute on chronic diastolic CHF (congestive heart failure) ([place]) 01/07/2017   Hypothyroidism 12/23/2016   Prostate cancer ([place])    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   Edema of both legs 09/01/2014   Asthma, intermittent 09/01/2014   Aortic valve  disorders 01/01/2011   Coronary artery disease involving native coronary artery of native heart without angina pectoris  GOUT 09/12/2009   CAD, AUTOLOGOUS BYPASS GRAFT 05/10/2009   MURMUR 05/08/2009   BRUIT 05/08/2009   MUSCLE CRAMPS 12/12/2008   KNEE SPRAIN 09/21/2008   Osteoarthritis 07/07/2008   CONTUSION, HIP 05/13/2008   BPH with urinary obstruction 02/01/2008   LOW BACK PAIN SYNDROME 02/01/2008   TESTOSTERONE DEFICIENCY 07/03/2007   Hyperlipidemia 07/03/2007   Essential hypertension 07/03/2007   MYOCARDIAL INFARCTION, HX OF 07/03/2007   Allergic rhinitis 07/03/2007   COLONIC POLYPS, HX OF 07/03/2007   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    Immunization History  Administered Date(s) Administered   Fluad Quad(high Dose 65+) 05/06/2019, 05/30/2020, 06/18/2021   Hep A / Hep B 04/06/2012, 10/07/2012   Hepatitis B 05/07/2012   Influenza Split 06/28/2013   Influenza Whole 06/05/2007, 05/13/2008, 06/06/2009, 05/30/2010   Influenza, High Dose Seasonal PF 05/15/2018   Influenza,inj,Quad PF,6+ Mos 05/20/2016   Influenza-Unspecified 05/25/2014, 07/08/2015, 05/06/2017   PFIZER(Purple Top)SARS-COV-2 Vaccination 09/28/2019, 10/12/2019, 05/30/2020   Pneumococcal Conjugate-13 09/18/2015   Pneumococcal Polysaccharide-23 05/13/2008, 05/25/2014   Td 05/30/2010   Tdap 10/27/2020   Zoster Recombinat (Shingrix) 05/18/2018, 10/16/2018   Zoster, Live 10/21/2007   Daughter is a Social worker she lives nearby. She helped decorate his house for Christmas this year. His other daughter lives in [place] and he will see her and her husband for New Years.  Patient has lost 5 lbs recently and he has been eating less breakfast and eating later than usually. His portions are lighter recently and has been eating a balanced and smaller dinner.  Patient is taking care of wife right now. She has not been driving for the last 4 months because she was having the shakes coming off of hydrocodone.  Conditions to be  addressed/monitored:  Hypertension, Hyperlipidemia, Heart Failure, Coronary Artery Disease, GERD, Hypothyroidism, Gout, and Allergic Rhinitis  Conditions addressed this visit: Hypertension, heart failure, gout  Care Plan : CCM Pharmacy Care Plan  Updates made by Viona Gilmore, [place] since 08/13/2021 12:00 AM     Problem: Problem: Hypertension, Hyperlipidemia, Heart Failure, Coronary Artery Disease, and Allergic Rhinitis      Long-Range Goal: Patient-Specific Goal   Start Date: 02/14/2021  Expected End Date: 02/14/2022  Recent Progress: On track  Priority: High  Note:   Current Barriers:  Unable to independently afford treatment regimen Unable to independently monitor therapeutic efficacy  Pharmacist Clinical Goal(s):  Patient will verbalize ability to afford treatment regimen achieve adherence to monitoring guidelines and medication adherence to achieve therapeutic efficacy through collaboration with PharmD and provider.   Interventions: 1:1 collaboration with Laurey Morale, MD regarding development and update of comprehensive plan of care as evidenced by provider attestation and co-signature Inter-disciplinary care team collaboration (see longitudinal plan of care) Comprehensive medication review performed; medication list updated in electronic medical record  Hypertension (BP goal <130/80) -Controlled -Current treatment: Furosemide 80 mg 1 tablet daily (alternating between 0.5 and 1 tablet daily)  Isosorbide mononitrate 30 mg 1 tablet daily Metoprolol succinate 50 mg 1.5 tablet daily with or immediately following a meal Spironolactone 25 mg 1 tablet daily -Medications previously tried: losartan  -Current home readings: 117/61 (not checking consistently) -Current dietary habits: did not discuss -Current exercise habits: was walking some in [employer] -Denies hypotensive/hypertensive symptoms -Educated on Importance of home blood pressure monitoring; Proper BP  monitoring technique; Symptoms of hypotension and importance of maintaining adequate hydration; -Counseled to monitor BP at home weekly, document, and provide log at future appointments -Counseled on diet and exercise extensively Recommended  to continue current medication  Hyperlipidemia: (LDL goal < 70) -Controlled -Current treatment: Atorvastatin 20 mg 1 tablet daily -Medications previously tried: none  -Current dietary patterns: did not discuss -Current exercise habits: did not discuss -Educated on Cholesterol goals;  Importance of limiting foods high in cholesterol; Exercise goal of 150 minutes per week; -Counseled on diet and exercise extensively Recommended to continue current medication  CAD (Goal: prevent heart events) -Controlled -Current treatment  Aspirin 81 mg 1 tablet daily Pradaxa 150 mg 1 capsule twice daily -Medications previously tried: n/a  -Recommended to continue current medication  Heart Failure (Goal: manage symptoms and prevent exacerbations) -Controlled -Last ejection fraction: 30-35% (Date: 04/2019) -HF type: Combined Systolic and Diastolic -NYHA Class: III (marked limitation of activity) -AHA HF Stage: C (Heart disease and symptoms present) -Current treatment: Isosorbide mononitrate 30 mg 1 tablet daily Furosemide 80 mg 1 tablet daily (alternating between 0.5 and 1 tablet daily)  Metoprolol succinate 50 mg 1.5 tablets by mouth daily with or immediately following a meal Entresto 97-103 mg 1 tablet twice daily  Spironolactone 25 mg 1 tablet daily -Medications previously tried: losartan  -Current home BP/HR readings: not checking regularly -Current dietary habits: did not discuss -Current exercise habits: does have an [employer] membership but plans to activate in January -Educated on Benefits of medications for managing symptoms and prolonging life Importance of weighing daily; if you gain more than 3 pounds in one day or 5 pounds in one week, call  cardiologist -Counseled on diet and exercise extensively Recommended to continue current medication  Diabetes (A1c goal <7%) -Controlled -Current medications: No medications -Medications previously tried: none  -Current home glucose readings fasting glucose: does not need to check post prandial glucose: does not need to check -Denies hypoglycemic/hyperglycemic symptoms -Current meal patterns:  breakfast: did not discuss  lunch: did not discuss  dinner: did not discuss snacks: did not discuss drinks: did not discuss -Current exercise: did not discuss -Educated on A1c and blood sugar goals; -Counseled to check feet daily and get yearly eye exams -Counseled on diet and exercise extensively Recommended repeat A1c  GERD (Goal: minimize symptoms) -Controlled -Current treatment  Pepcid Complete (Famotidine + Calcium + Magnesium) 2 tablets at bedtime Omeprazole 20 mg 1 capsule twice daily Probiotic daily -Medications previously tried: n/a  -Recommended to continue current medication  Hypothyroidism (Goal: TSH 0.35-4.5) -Controlled -Current treatment  Levothyroxine 100 mcg 1 tablet daily -Medications previously tried: none -Recommended to continue current medication  Allergic rhinitis (Goal: minimize symptoms) -Not ideally controlled -Current treatment  Azelastine 0.15% 2 sprays into the nose in the morning and at bedtime Xyzal 5 mg 1 tablet daily (in AM) Flonase 2 sprays into both nostrils daily Montelukast 10 mg 1 tablet in the evening Benadryl 25 mg 1 tablet at bedtime Cetirizine 10 mg - 1/2 tablet at bedtime Benzonatate 200 mg 1 capsule every 8 hrs as needed for cough -Medications previously tried: n/a  -Recommended limiting use of Benadryl and caution with using 2 antihistamines (cetirizine and Xyzal)  Gout (Goal: prevent flare ups) -Controlled -Current treatment  Colchicine 0.6 mg tablet daily - as needed -Medications previously tried: n/a  -Recommended to  continue current medication Recommended allpurinol to lower uric acid level.  Health Maintenance -Vaccine gaps: COVID booster -Current therapy:  Triamcinolone 0.1% cream once daily as needed for dry skin Valtrex 500 mg 1 tablet twice daily as needed for cold sores -Educated on Cost vs benefit of each product must be carefully weighed by individual  consumer -Patient is satisfied with current therapy and denies issues -Recommended to continue current medication  Patient Goals/Self-Care Activities Patient will:  - take medications as prescribed check blood pressure weekly, document, and provide at future appointments  Follow Up Plan: Telephone follow up appointment with care management team member scheduled for: 6 months      Medication Assistance: Application for  Entresto  medication assistance program. in process.  Anticipated assistance start date 09/14/21.  See plan of care for additional detail.  Compliance/Adherence/Medication fill history: Care Gaps: COVID booster  Star-Rating Drugs: Atorvastatin 20 mg - last filled 01/24/21 for 90 ds Entresto 97-103 mg - last filled 02/12/21 for 90 ds  Patient's preferred pharmacy is:  [facility], [place] [id number] Phone: [phone] Fax: [phone]   Uses pill box? Yes Pt endorses 99% compliance  We discussed: Current pharmacy is preferred with insurance plan and patient is satisfied with pharmacy services Patient decided to: Continue current medication management strategy  Care Plan and Follow Up Patient Decision:  Patient agrees to Care Plan and Follow-up.  Plan: Telephone follow up appointment with care management team member scheduled for:  6 months  [name], PharmD, [place] Pharmacist [place] [facility] [place] [phone]

## 2021-08-23 ENCOUNTER — Other Ambulatory Visit: Payer: Self-pay

## 2021-08-23 ENCOUNTER — Ambulatory Visit: Payer: Medicare Other | Admitting: Internal Medicine

## 2021-08-23 ENCOUNTER — Encounter: Payer: Self-pay | Admitting: Internal Medicine

## 2021-08-23 VITALS — BP 150/60 | HR 73 | Ht 66.0 in | Wt 273.0 lb

## 2021-08-23 DIAGNOSIS — I5022 Chronic systolic (congestive) heart failure: Secondary | ICD-10-CM | POA: Diagnosis not present

## 2021-08-23 DIAGNOSIS — I428 Other cardiomyopathies: Secondary | ICD-10-CM | POA: Diagnosis not present

## 2021-08-23 DIAGNOSIS — Z79899 Other long term (current) drug therapy: Secondary | ICD-10-CM | POA: Diagnosis not present

## 2021-08-23 MED ORDER — METOLAZONE 2.5 MG PO TABS: ORAL_TABLET | ORAL | 3 refills | Status: DC

## 2021-08-23 NOTE — Progress Notes (Signed)
Patient Care Team: Laurey Morale, MD as PCP - General Angelena Form, [name], MD as PCP - Cardiology (Cardiology) Festus Aloe, MD as Consulting Physician (Urology) Viona Gilmore, [facility] as Pharmacist (Pharmacist)   HPI  Justin [name] is a 87 y.o. male Seen for follow-up of a CRT ICD 9/18 implanted because of syncope  and new left bundle branch block following TAVR ; hx of Afib on dabigitran and ASA;  intercurrent Afib with DCCV 9/22   Much improved with CRT       On entresto  DATE TEST EF   1/18 LHC  Patent grafts  8/18 Echo   40-45 %   6/19 Echo   40-45 %   9/20 Echo  35-40%          DATE Cr K Hgb  11/22  1.08 4.7 13.0(8/22)               Today, notes has been taking care of his wife for the last 6 months due to her back pain, but now her pain has been resolved following surgery. He can not drive due to his macular degeneration, and his vision is also limited.  And so his exercises been cut off.  Weight is up about 20 pounds since his around the world cruise  The patient denies chest pain  nocturnal dyspnea, orthopnea  Some peripheral edema There have been no palpitations, lightheadedness or syncope.  He is unaware of his atrial fibrillation which prompted cardioversion [place] PVCs  On dabigitran and ASA without bleeding  He checks his blood pressure at home, which is around 116, 115.  He remains compliant with all of his medication. One lasix and 2 potassium supplements. He previoulsy took metolazone  but had to stop because he was losing too much fluid.     Past Medical History:  Diagnosis Date   Age-related macular degeneration, dry, both eyes    Allergic    "24/7; 365 days/year; I'm allergic to pollens, dust, all southern grasses/trees, mold, mildue, cats, dogs" (01/07/2017)   Anal fissure    Asthma    sees [name]    Benign prostatic hypertrophy    (sees Dr. Denman George   CAD (coronary artery disease)    a. s/p CABG 2007. b. Cath  08/2016 - 4/4 patent grafts.   Carotid bruit    carotid u/s 10/10: 0.39% bilaterally   Chronic combined systolic and diastolic CHF (congestive heart failure) (HCC)    Complication of anesthesia 1980s   "w/anal cyst OR, he gave me a saddle block then put a narcotic in spinal cord; had a severe reaction to that" (01/07/2017)   Congestive heart failure (CHF) [facility])    ED (erectile dysfunction)    Family history of adverse reaction to anesthesia    "daughter wakes up during OR" (01/07/2017)   GERD (gastroesophageal reflux disease)    Gout    HTN (hypertension)    Hx of colonic polyps    (sees Dr. Henrene Pastor)   Hyperlipidemia    Hypothyroidism    Moderate to severe aortic stenosis    a. s/p TAVR 02/2017.   Myocardial infarction [facility]) ~ 2000   Obesity    Osteoarthritis    "was in my knees, hands" (01/07/2017 )   PAF (paroxysmal atrial fibrillation) ([place])    a. documented post TAVR.   Precancerous skin lesion    (sees Dr. 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   29 mm Edwards Sapien 3 transcatheter heart valve placed via percutaneous right transfemoral approach    Past Surgical History:  Procedure Laterality Date   ANUS SURGERY     "opened it back up cause it wouldn't heal; wound up w/a fissure" (01/07/2017)   BIV ICD INSERTION CRT-D N/A 05/02/2017   Procedure: BIV ICD INSERTION CRT-D;  Surgeon: Deboraha Sprang, MD;  Location: [place] CV [facility];  Service: Cardiovascular;  Laterality: N/A;   CARDIAC CATHETERIZATION  10/29/2005   CARDIAC CATHETERIZATION N/A 08/27/2016   Procedure: Right/Left Heart Cath and Coronary/Graft Angiography;  Surgeon: Burnell Blanks, MD;  Location: [place] [facility];  Service: Cardiovascular;  Laterality: N/A;   CARDIOVERSION N/A 04/24/2021   Procedure: CARDIOVERSION;  Surgeon: Fay Records, MD;  Location: [facility] ENDOSCOPY;  Service: Cardiovascular;  Laterality: N/A;   CATARACT  EXTRACTION W/ INTRAOCULAR LENS  IMPLANT, BILATERAL Bilateral    CATARACT EXTRACTION, BILATERAL  2012   COLONOSCOPY  06/30/2008   no repeats needed    COLONOSCOPY     had 3 or 4 in the past    CORONARY ARTERY BYPASS GRAFT  2007   "CABG X4"   CYST EXCISION PERINEAL  1980s   HAMMER TOE SURGERY Bilateral    JOINT REPLACEMENT     KNEE ARTHROPLASTY  07/30/2011   Procedure: COMPUTER ASSISTED TOTAL KNEE ARTHROPLASTY;  Surgeon: Meredith Pel;  Location: [place];  Service: Orthopedics;  Laterality: Left;  left total knee arthroplasty   MASTECTOMY SUBCUTANEOUS Bilateral    MULTIPLE TOOTH EXTRACTIONS     ORIF FINGER / THUMB FRACTURE Right ~ 1980   "repair of thumb injury"   PROSTATE BIOPSY     REPLACEMENT TOTAL KNEE BILATERAL Bilateral 2012   TEE WITHOUT CARDIOVERSION N/A 03/04/2017   Procedure: TRANSESOPHAGEAL ECHOCARDIOGRAM (TEE);  Surgeon: Burnell Blanks, MD;  Location: [place];  Service: Open Heart Surgery;  Laterality: N/A;   TOTAL KNEE REVISION Right 05/18/2019   Procedure: RIGHT PATELLA REVISION/REMOVAL;  Surgeon: Meredith Pel, MD;  Location: [place];  Service: Orthopedics;  Laterality: Right;   TRANSCATHETER AORTIC VALVE REPLACEMENT, TRANSFEMORAL N/A 03/04/2017   Procedure: TRANSCATHETER AORTIC VALVE REPLACEMENT, TRANSFEMORAL;  Surgeon: Burnell Blanks, MD;  Location: [place];  Service: Open Heart Surgery;  Laterality: N/A;    Current Outpatient Medications  Medication Sig Dispense Refill   aspirin 81 MG tablet Take 1 tablet (81 mg total) by mouth daily. 30 tablet 0   atorvastatin (LIPITOR) 20 MG tablet TAKE 1 TABLET ONCE DAILY. 90 tablet 0   Azelastine HCl 0.15 % SOLN Place 2 sprays into the nose in the morning and at bedtime. 30 mL 11   benzonatate (TESSALON) 200 MG capsule Take 1 capsule (200 mg total) by mouth 2 (two) times daily as needed for cough. 180 capsule 3   colchicine 0.6 MG tablet Take 1 tablet (0.6 mg total) by mouth daily. 30 tablet 0   dabigatran  (PRADAXA) 150 MG CAPS capsule TAKE (1) CAPSULE TWICE DAILY. 180 capsule 3   diphenhydrAMINE (BENADRYL) 25 MG tablet Take 25 mg by mouth at bedtime.      ENTRESTO 97-103 MG TAKE 1 TABLET BY MOUTH TWICE DAILY. 180 tablet 2   EPINEPHrine 0.3 mg/0.3 mL IJ SOAJ injection Inject 0.3 mLs (0.3 mg total) into the muscle as needed for anaphylaxis. 2 Device 1   famotidine (PEPCID) 20 MG tablet Take 1  tablet (20 mg total) by mouth at bedtime. 90 tablet 3   fluorouracil (EFUDEX) 5 % cream Apply 1 application topically 2 (two) times daily as needed (rash).     furosemide (LASIX) 80 MG tablet Take 1 tablet (80 mg total) by mouth daily. 90 tablet 3   hydrOXYzine (ATARAX/VISTARIL) 10 MG tablet Take 10 mg by mouth at bedtime.     isosorbide mononitrate (IMDUR) 30 MG 24 hr tablet TAKE ONE TABLET ONCE DAILY 90 tablet 0   ketotifen (ZADITOR) 0.025 % ophthalmic solution Apply 1 drop to eye daily as needed (itchy eyes).      levocetirizine (XYZAL) 5 MG tablet Take 5 mg by mouth daily.     levothyroxine (SYNTHROID) 100 MCG tablet Take 1 tablet (100 mcg total) by mouth daily. 90 tablet 3   meclizine (ANTIVERT) 25 MG tablet Take 1 tablet (25 mg total) by mouth every 4 (four) hours as needed for dizziness. 60 tablet 2   methocarbamol (ROBAXIN) 500 MG tablet Take 1 tablet (500 mg total) by mouth every 8 (eight) hours as needed for muscle spasms. 30 tablet 0   metoCLOPramide (REGLAN) 10 MG tablet TAKE ONE TABLET EVERY MORNING WITH BREAKFAST 30 tablet 2   metolazone (ZAROXOLYN) 2.5 MG tablet Take 1 tablet by mouth twice per week. 24 tablet 3   metoprolol succinate (TOPROL-XL) 50 MG 24 hr tablet Take 1.5 tablets (75 mg total) by mouth daily. Take with or immediately following a meal. 135 tablet 1   montelukast (SINGULAIR) 10 MG tablet TAKE 1 TABLET ONCE DAILY IN THE EVENING. 90 tablet 3   Multiple Vitamins-Minerals (PRESERVISION AREDS PO) Take 2 tablets by mouth every morning.     omeprazole (PRILOSEC) 20 MG capsule TAKE (1)  CAPSULE TWICE DAILY. 180 capsule 3   oxyCODONE (OXY IR/ROXICODONE) 5 MG immediate release tablet Take 1 tablet (5 mg total) by mouth every 12 (twelve) hours as needed for severe pain. 30 tablet 0   potassium bicarbonate (K-LYTE) 25 MEQ disintegrating tablet Take 25 mEq by mouth 2 (two) times daily.     potassium chloride SA (KLOR-CON) 20 MEQ tablet Take 2 tablets (40 mEq total) by mouth daily. Take 2 tablets daily alternating with 1 tablet every other day 180 tablet 3   spironolactone (ALDACTONE) 25 MG tablet TAKE ONE TABLET BY MOUTH EVERY DAY. 90 tablet 1   triamcinolone cream (KENALOG) 0.1 % Apply 1 application topically daily as needed for dry skin.     valACYclovir (VALTREX) 500 MG tablet Take 500 mg by mouth 2 (two) times daily as needed (cold sores).     No current facility-administered medications for this visit.    Allergies  Allergen Reactions   Peanut-Containing Drug Products Anaphylaxis   Sulfonamide Derivatives Anaphylaxis   Amlodipine Swelling    Swelling in ankles   Eliquis [Apixaban] Other (See Comments)    Back/hip pain   Lisinopril Cough    cough   Xarelto [Rivaroxaban] Other (See Comments)    Back/hip pain     Review of Systems negative except from HPI and PMH Please see the history of present illness. (+)  All other systems are reviewed and negative.     Physical Exam BP (!) 150/60    Pulse 73    Ht 5\' 6"  (1.676 m)    Wt 273 lb (123.8 kg)    SpO2 98%    BMI 44.06 kg/m  Well developed and Morbidly obese  in no acute distress HENT normal Neck supple  with JVP-flat Clear Device pocket well healed; without hematoma or erythema.  There is no tethering  Regular rate and rhythm, no  gallop No murmur Abd-soft with active BS No Clubbing cyanosis 1+ edema  Skin-warm and dry A & Oriented  Grossly normal sensory and motor function   08/23/2021 ECG: Sinus with P synchronous pacing in upright QRS lead V1 and negative QRS lead I and frequent PVCs with a frequency of  1 every 3-5 beats.  Efforts to overdrive suppress with rates up to 80 had no impact.   Assessment and  Plan  Ventricular tachycardia- prolonged- nonsustained  Sinus node dysfunction  Junctional rhythm  Post TAVR left bundle branch block/IVCD  Cardiomyopathy EF 40-45%  CRT- D [employer]       CHF chronic/acute systolic/diastolic   Morbidly obese  PVCs-infrequent  No interval ventricular tachycardia.  Frequent PVCs.  Impact on resynchronization effective pacing is about 65%.  We will need to try to suppress particularly if there is interval change in left ventricular systolic function.  At this time we will check an echocardiogram.  In the absence of symptoms LV function would be the guide as to whether antiarrhythmic therapy would be appropriate.  Continue his metoprolol at 75 mg daily for his cardiomyopathy Entresto 97/103  He is volume overloaded.  We will continue him on his furosemide 80 but have him take metolazone 2.5 mg 2 times a week.  We will check his metabolic profile a couple of weeks.  He is on aspirin in addition to his dabigatran.  That indication is not clear to me; may be related to his TAVR.  I will reach out to [facility] for guidance.  No clinical bleeding and hemoglobin has been about 13 over the last 5 years.     F/U in  1 year   I,Tinashe Williams,acting as a scribe for Virl Axe, MD.,have documented all relevant documentation on the behalf of Virl Axe, MD,as directed by  Virl Axe, MD while in the presence of Virl Axe, MD.   I, Virl Axe, MD, have reviewed all documentation for this visit. The documentation on 08/23/21 for the exam, diagnosis, procedures, and orders are all accurate and complete.

## 2021-08-23 NOTE — Patient Instructions (Signed)
Medication Instructions:  Your physician has recommended you make the following change in your medication:   ** Begin Metolazone 2.5mg  - 1 tablet by mouth twice per week.  *If you need a refill on your cardiac medications before your next appointment, please call your pharmacy*   Lab Work: BMET in 2 weeks If you have labs (blood work) drawn today and your tests are completely normal, you will receive your results only by: [place] (if you have MyChart) OR A paper copy in the mail If you have any lab test that is abnormal or we need to change your treatment, we will call you to review the results.   Testing/Procedures: Your physician has requested that you have an echocardiogram. Echocardiography is a painless test that uses sound waves to create images of your heart. It provides your doctor with information about the size and shape of your heart and how well your hearts chambers and valves are working. This procedure takes approximately one hour. There are no restrictions for this procedure.    Follow-Up: At [facility], you and your health needs are our priority.  As part of our continuing mission to provide you with exceptional heart care, we have created designated Provider Care Teams.  These Care Teams include your primary Cardiologist (physician) and Advanced Practice Providers (APPs -  Physician Assistants and Nurse Practitioners) who all work together to provide you with the care you need, when you need it.  We recommend signing up for the patient portal called "MyChart".  Sign up information is provided on this After Visit Summary.  MyChart is used to connect with patients for Virtual Visits (Telemedicine).  Patients are able to view lab/test results, encounter notes, upcoming appointments, etc.  Non-urgent messages can be sent to your provider as well.   To learn more about what you can do with MyChart, go to [email or web address].    Your next appointment:    1  year with Dr [name] 1 1 year with Dr [name] :1}

## 2021-08-25 DIAGNOSIS — I10 Essential (primary) hypertension: Secondary | ICD-10-CM | POA: Diagnosis not present

## 2021-08-25 DIAGNOSIS — I5033 Acute on chronic diastolic (congestive) heart failure: Secondary | ICD-10-CM

## 2021-08-28 LAB — CUP PACEART INCLINIC DEVICE CHECK
Battery Remaining Longevity: 20 mo
Battery Voltage: 2.91 V
Brady Statistic AP VP Percent: 10.97 %
Brady Statistic AP VS Percent: 1.21 %
Brady Statistic AS VP Percent: 76.72 %
Brady Statistic AS VS Percent: 11.1 %
Brady Statistic RA Percent Paced: 10.72 %
Brady Statistic RV Percent Paced: 61.05 %
Date Time Interrogation Session: 20221229235000
HighPow Impedance: 76 Ohm
Implantable Lead Implant Date: 20180907
Implantable Lead Implant Date: 20180907
Implantable Lead Implant Date: 20180907
Implantable Lead Location: 753858
Implantable Lead Location: 753859
Implantable Lead Location: 753860
Implantable Lead Model: 4396
Implantable Lead Model: 5076
Implantable Pulse Generator Implant Date: 20180907
Lead Channel Impedance Value: 323 Ohm
Lead Channel Impedance Value: 342 Ohm
Lead Channel Impedance Value: 380 Ohm
Lead Channel Impedance Value: 513 Ohm
Lead Channel Impedance Value: 532 Ohm
Lead Channel Impedance Value: 912 Ohm
Lead Channel Pacing Threshold Amplitude: 0.75 V
Lead Channel Pacing Threshold Amplitude: 1 V
Lead Channel Pacing Threshold Amplitude: 1.5 V
Lead Channel Pacing Threshold Pulse Width: 0.4 ms
Lead Channel Pacing Threshold Pulse Width: 0.4 ms
Lead Channel Pacing Threshold Pulse Width: 1 ms
Lead Channel Sensing Intrinsic Amplitude: 1.375 mV
Lead Channel Sensing Intrinsic Amplitude: 1.625 mV
Lead Channel Sensing Intrinsic Amplitude: 8.125 mV
Lead Channel Sensing Intrinsic Amplitude: 9.125 mV
Lead Channel Setting Pacing Amplitude: 1.5 V
Lead Channel Setting Pacing Amplitude: 2 V
Lead Channel Setting Pacing Amplitude: 2.5 V
Lead Channel Setting Pacing Pulse Width: 0.4 ms
Lead Channel Setting Pacing Pulse Width: 1 ms
Lead Channel Setting Sensing Sensitivity: 0.3 mV

## 2021-08-29 ENCOUNTER — Other Ambulatory Visit: Payer: Self-pay

## 2021-08-29 DIAGNOSIS — H353221 Exudative age-related macular degeneration, left eye, with active choroidal neovascularization: Secondary | ICD-10-CM | POA: Diagnosis not present

## 2021-08-29 DIAGNOSIS — M109 Gout, unspecified: Secondary | ICD-10-CM

## 2021-08-29 MED ORDER — ALLOPURINOL 100 MG PO TABS: 100.0000 mg | ORAL_TABLET | Freq: Every day | ORAL | 3 refills | Status: DC

## 2021-08-30 ENCOUNTER — Other Ambulatory Visit: Payer: Self-pay | Admitting: Critical Care Medicine

## 2021-08-30 ENCOUNTER — Other Ambulatory Visit: Payer: Self-pay | Admitting: Family Medicine

## 2021-09-03 ENCOUNTER — Ambulatory Visit (INDEPENDENT_AMBULATORY_CARE_PROVIDER_SITE_OTHER): Payer: Medicare Other

## 2021-09-03 DIAGNOSIS — I5022 Chronic systolic (congestive) heart failure: Secondary | ICD-10-CM | POA: Diagnosis not present

## 2021-09-03 DIAGNOSIS — Z95810 Presence of automatic (implantable) cardiac defibrillator: Secondary | ICD-10-CM

## 2021-09-04 ENCOUNTER — Other Ambulatory Visit: Payer: Self-pay

## 2021-09-04 ENCOUNTER — Encounter: Payer: Self-pay | Admitting: Pulmonary Disease

## 2021-09-04 ENCOUNTER — Ambulatory Visit: Payer: Medicare Other | Admitting: Pulmonary Disease

## 2021-09-04 VITALS — BP 130/64 | HR 101 | Temp 97.8°F | Ht 66.0 in | Wt 271.0 lb

## 2021-09-04 DIAGNOSIS — J300 Vasomotor rhinitis: Secondary | ICD-10-CM | POA: Diagnosis not present

## 2021-09-04 DIAGNOSIS — R053 Chronic cough: Secondary | ICD-10-CM | POA: Diagnosis not present

## 2021-09-04 MED ORDER — AZELASTINE HCL 0.15 % NA SOLN: 2.0000 | Freq: Two times a day (BID) | NASAL | 11 refills | Status: DC

## 2021-09-04 MED ORDER — FLUTICASONE PROPIONATE 50 MCG/ACT NA SUSP: 1.0000 | Freq: Two times a day (BID) | NASAL | 11 refills | Status: DC

## 2021-09-04 MED ORDER — BENZONATATE 200 MG PO CAPS: 200.0000 mg | ORAL_CAPSULE | Freq: Two times a day (BID) | ORAL | 3 refills | Status: DC | PRN

## 2021-09-04 NOTE — Progress Notes (Signed)
Synopsis: Referred in 2017 for asthma by Justin Morale, MD.  Previously patient of Justin [name].  Subjective:   PATIENT ID: Justin [name] GENDER: male DOB: [birth year], MRN: [id number]  Chief Complaint  Patient presents with   New Patient (Initial Visit)    New patient due to former Justin [name] patient due to allergic rhinitis.     Justin [name] is an 88 y.o. gentleman with history of allergic rhinitis and irritable larynx syndrome who presents for follow-up.  Last seen by Justin [name] in 2021, note reviewed.[name]  He continues on Pepcid and omeprazole for his GERD.  He continues on Singulair, Flonase in the morning, astelin BID.  He also takes Benadryl and zyrtec at and xyzal in the morning.  He takes Best boy twice per day but has ran out.  Cough is unchanged.  He can deal with this.  He think some of this is due to [facility] but he wants to continue [place] which is reasonable.  Still endorses a lot of vasomotor rhinitis.  Some cough when he eats or drinks.  About once a week.    Flowsheet data:  Lab Results  Component Value Date   NITRICOXIDE 43 08/18/2018    Past Medical History:  Diagnosis Date   Age-related macular degeneration, dry, both eyes    Allergic    "24/7; 365 days/year; I'm allergic to pollens, dust, all southern grasses/trees, mold, mildue, cats, dogs" (01/07/2017)   Anal fissure    Asthma    sees Justin [name]    Benign prostatic hypertrophy    (sees Justin. Denman George   CAD (coronary artery disease)    a. s/p CABG 2007. b. Cath 08/2016 - 4/4 patent grafts.   Carotid bruit    carotid u/s 10/10: 0.39% bilaterally   Chronic combined systolic and diastolic CHF (congestive heart failure) (HCC)    Complication of anesthesia 1980s   "w/anal cyst OR, he gave me a saddle block then put a narcotic in spinal cord; had a severe reaction to that" (01/07/2017)   Congestive heart failure (CHF) [facility])    ED (erectile dysfunction)    Family history of adverse reaction to anesthesia     "daughter wakes up during OR" (01/07/2017)   GERD (gastroesophageal reflux disease)    Gout    HTN (hypertension)    Hx of colonic polyps    (sees Justin. Henrene Pastor)   Hyperlipidemia    Hypothyroidism    Moderate to severe aortic stenosis    a. s/p TAVR 02/2017.   Myocardial infarction [facility]) ~ 2000   Obesity    Osteoarthritis    "was in my knees, hands" (01/07/2017 )   PAF (paroxysmal atrial fibrillation) ([place])    a. documented post TAVR.   Precancerous skin lesion    (sees Justin. 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   29 mm Edwards Sapien 3 transcatheter heart valve placed via percutaneous right transfemoral approach     Family History  Problem Relation Age of Onset   Heart attack Father 31   Allergic rhinitis Father    Asthma Father    Heart failure Mother 35   Uterine cancer Mother    Breast cancer Mother    Colon cancer Neg Hx    Esophageal cancer Neg Hx      Past Surgical History:  Procedure Laterality Date   ANUS SURGERY     "  opened it back up cause it wouldn't heal; wound up w/a fissure" (01/07/2017)   BIV ICD INSERTION CRT-D N/A 05/02/2017   Procedure: BIV ICD INSERTION CRT-D;  Surgeon: Deboraha Sprang, MD;  Location: [place] [facility];  Service: Cardiovascular;  Laterality: N/A;   CARDIAC CATHETERIZATION  10/29/2005   CARDIAC CATHETERIZATION N/A 08/27/2016   Procedure: Right/Left Heart Cath and Coronary/Graft Angiography;  Surgeon: Burnell Blanks, MD;  Location: [place] [facility];  Service: Cardiovascular;  Laterality: N/A;   CARDIOVERSION N/A 04/24/2021   Procedure: CARDIOVERSION;  Surgeon: Fay Records, MD;  Location: [facility] ENDOSCOPY;  Service: Cardiovascular;  Laterality: N/A;   CATARACT EXTRACTION W/ INTRAOCULAR LENS  IMPLANT, BILATERAL Bilateral    CATARACT EXTRACTION, BILATERAL  2012   COLONOSCOPY  06/30/2008   no repeats needed    COLONOSCOPY     had 3 or 4 in the past     CORONARY ARTERY BYPASS GRAFT  2007   "CABG X4"   CYST EXCISION PERINEAL  1980s   HAMMER TOE SURGERY Bilateral    JOINT REPLACEMENT     KNEE ARTHROPLASTY  07/30/2011   Procedure: COMPUTER ASSISTED TOTAL KNEE ARTHROPLASTY;  Surgeon: Meredith Pel;  Location: [place];  Service: Orthopedics;  Laterality: Left;  left total knee arthroplasty   MASTECTOMY SUBCUTANEOUS Bilateral    MULTIPLE TOOTH EXTRACTIONS     ORIF FINGER / THUMB FRACTURE Right ~ 1980   "repair of thumb injury"   PROSTATE BIOPSY     REPLACEMENT TOTAL KNEE BILATERAL Bilateral 2012   TEE WITHOUT CARDIOVERSION N/A 03/04/2017   Procedure: TRANSESOPHAGEAL ECHOCARDIOGRAM (TEE);  Surgeon: Burnell Blanks, MD;  Location: [place];  Service: Open Heart Surgery;  Laterality: N/A;   TOTAL KNEE REVISION Right 05/18/2019   Procedure: RIGHT PATELLA REVISION/REMOVAL;  Surgeon: Meredith Pel, MD;  Location: [place];  Service: Orthopedics;  Laterality: Right;   TRANSCATHETER AORTIC VALVE REPLACEMENT, TRANSFEMORAL N/A 03/04/2017   Procedure: TRANSCATHETER AORTIC VALVE REPLACEMENT, TRANSFEMORAL;  Surgeon: Burnell Blanks, MD;  Location: [place];  Service: Open Heart Surgery;  Laterality: N/A;    Social History   Socioeconomic History   Marital status: Married    Spouse name: Not on file   Number of children: 3   Years of education: Not on file   Highest education level: Not on file  Occupational History   Occupation: Clinical biochemist    Comment: builds malls  Tobacco Use   Smoking status: Former    Packs/day: 3.50    Years: 13.00    Pack years: 45.50    Types: Cigarettes    Quit date: 1963    Years since quitting: 60.0   Smokeless tobacco: Never   Tobacco comments:    Former smoker 04/19/21  Vaping Use   Vaping Use: Never used  Substance and Sexual Activity   Alcohol use: Not Currently    Alcohol/week: 7.0 standard drinks    Types: 7 Cans of beer per week    Comment: 1 beer daily after 5pm (04/19/21)    Drug use: No   Sexual activity: Never  Other Topics Concern   Not on file  Social History Narrative   FH of CAD, Male 1st degree relative less than age 24.   Social Determinants of Health   Financial Resource Strain: Low Risk    Difficulty of Paying Living Expenses: Not very hard  Food Insecurity: No Food Insecurity   Worried About Running Out of Food in the Last  Year: Never true   [place] in the Last Year: Never true  Transportation Needs: No Transportation Needs   Lack of Transportation (Medical): No   Lack of Transportation (Non-Medical): No  Physical Activity: Insufficiently Active   Days of Exercise per Week: 4 days   Minutes of Exercise per Session: 30 min  Stress: No Stress Concern Present   Feeling of Stress : Not at all  Social Connections: Moderately Isolated   Frequency of Communication with Friends and Family: Three times a week   Frequency of Social Gatherings with Friends and Family: Three times a week   Attends Religious Services: Never   Active Member of Clubs or Organizations: No   Attends Archivist Meetings: Never   Marital Status: Married  Human resources officer Violence: Not At Risk   Fear of Current or Ex-Partner: No   Emotionally Abused: No   Physically Abused: No   Sexually Abused: No     Allergies  Allergen Reactions   Peanut-Containing Drug Products Anaphylaxis   Sulfonamide Derivatives Anaphylaxis   Amlodipine Swelling    Swelling in ankles   Eliquis [Apixaban] Other (See Comments)    Back/hip pain   Lisinopril Cough    cough   Xarelto [Rivaroxaban] Other (See Comments)    Back/hip pain     Immunization History  Administered Date(s) Administered   Fluad Quad(high Dose 65+) 05/06/2019, 05/30/2020, 06/18/2021   Hep A / Hep B 04/06/2012, 10/07/2012   Hepatitis B 05/07/2012   Influenza Split 06/28/2013   Influenza Whole 06/05/2007, 05/13/2008, 06/06/2009, 05/30/2010   Influenza, High Dose Seasonal PF 05/15/2018    Influenza,inj,Quad PF,6+ Mos 05/20/2016   Influenza-Unspecified 05/25/2014, 07/08/2015, 05/06/2017   PFIZER(Purple Top)SARS-COV-2 Vaccination 09/28/2019, 10/12/2019, 05/30/2020   Pneumococcal Conjugate-13 09/18/2015   Pneumococcal Polysaccharide-23 05/13/2008, 05/25/2014   Td 05/30/2010   Tdap 10/27/2020   Zoster Recombinat (Shingrix) 05/18/2018, 10/16/2018   Zoster, Live 10/21/2007    Outpatient Medications Prior to Visit  Medication Sig Dispense Refill   allopurinol (ZYLOPRIM) 100 MG tablet Take 1 tablet (100 mg total) by mouth daily. 90 tablet 3   aspirin 81 MG tablet Take 1 tablet (81 mg total) by mouth daily. 30 tablet 0   atorvastatin (LIPITOR) 20 MG tablet TAKE 1 TABLET ONCE DAILY. 90 tablet 0   colchicine 0.6 MG tablet Take 1 tablet (0.6 mg total) by mouth daily. 30 tablet 0   dabigatran (PRADAXA) 150 MG CAPS capsule TAKE (1) CAPSULE TWICE DAILY. 180 capsule 3   diphenhydrAMINE (BENADRYL) 25 MG tablet Take 25 mg by mouth at bedtime.      ENTRESTO 97-103 MG TAKE 1 TABLET BY MOUTH TWICE DAILY. 180 tablet 2   EPINEPHrine 0.3 mg/0.3 mL IJ SOAJ injection Inject 0.3 mLs (0.3 mg total) into the muscle as needed for anaphylaxis. 2 Device 1   famotidine (PEPCID) 20 MG tablet Take 1 tablet (20 mg total) by mouth at bedtime. 90 tablet 3   fluorouracil (EFUDEX) 5 % cream Apply 1 application topically 2 (two) times daily as needed (rash).     furosemide (LASIX) 80 MG tablet Take 1 tablet (80 mg total) by mouth daily. 90 tablet 3   hydrOXYzine (ATARAX/VISTARIL) 10 MG tablet Take 10 mg by mouth at bedtime.     isosorbide mononitrate (IMDUR) 30 MG 24 hr tablet TAKE ONE TABLET ONCE DAILY 90 tablet 0   ketotifen (ZADITOR) 0.025 % ophthalmic solution Apply 1 drop to eye daily as needed (itchy eyes).  levocetirizine (XYZAL) 5 MG tablet Take 5 mg by mouth daily.     levothyroxine (SYNTHROID) 100 MCG tablet Take 1 tablet (100 mcg total) by mouth daily. 90 tablet 3   meclizine (ANTIVERT) 25 MG  tablet Take 1 tablet (25 mg total) by mouth every 4 (four) hours as needed for dizziness. 60 tablet 2   methocarbamol (ROBAXIN) 500 MG tablet Take 1 tablet (500 mg total) by mouth every 8 (eight) hours as needed for muscle spasms. 30 tablet 0   metoCLOPramide (REGLAN) 10 MG tablet TAKE ONE TABLET BY MOUTH EVERY MORNING WITH BREAKFAST 30 tablet 2   metolazone (ZAROXOLYN) 2.5 MG tablet Take 1 tablet by mouth twice per week. 24 tablet 3   metoprolol succinate (TOPROL-XL) 50 MG 24 hr tablet Take 1.5 tablets (75 mg total) by mouth daily. Take with or immediately following a meal. 135 tablet 1   montelukast (SINGULAIR) 10 MG tablet TAKE 1 TABLET ONCE DAILY IN THE EVENING. 90 tablet 3   Multiple Vitamins-Minerals (PRESERVISION AREDS PO) Take 2 tablets by mouth every morning.     omeprazole (PRILOSEC) 20 MG capsule TAKE (1) CAPSULE TWICE DAILY. 180 capsule 3   oxyCODONE (OXY IR/ROXICODONE) 5 MG immediate release tablet Take 1 tablet (5 mg total) by mouth every 12 (twelve) hours as needed for severe pain. 30 tablet 0   potassium bicarbonate (K-LYTE) 25 MEQ disintegrating tablet Take 25 mEq by mouth 2 (two) times daily.     potassium chloride SA (KLOR-CON) 20 MEQ tablet Take 2 tablets (40 mEq total) by mouth daily. Take 2 tablets daily alternating with 1 tablet every other day 180 tablet 3   spironolactone (ALDACTONE) 25 MG tablet TAKE ONE TABLET BY MOUTH EVERY DAY. 90 tablet 1   triamcinolone cream (KENALOG) 0.1 % Apply 1 application topically daily as needed for dry skin.     valACYclovir (VALTREX) 500 MG tablet Take 500 mg by mouth 2 (two) times daily as needed (cold sores).     Azelastine HCl 0.15 % SOLN Place 2 sprays into the nose in the morning and at bedtime. 30 mL 11   benzonatate (TESSALON) 200 MG capsule Take 1 capsule (200 mg total) by mouth 2 (two) times daily as needed for cough. 180 capsule 3   No facility-administered medications prior to visit.       Objective:   Vitals:   09/04/21  1038  BP: 130/64  Pulse: (!) 101  Temp: 97.8 F (36.6 C)  TempSrc: Oral  SpO2: 98%  Weight: 271 lb (122.9 kg)  Height: 5\' 6"  (1.676 m)   98% on   RA BMI Readings from Last 3 Encounters:  09/04/21 43.74 kg/m  08/23/21 44.06 kg/m  06/18/21 44.93 kg/m   Wt Readings from Last 3 Encounters:  09/04/21 271 lb (122.9 kg)  08/23/21 273 lb (123.8 kg)  06/18/21 278 lb 6.4 oz (126.3 kg)    Physical Exam Vitals reviewed.  Constitutional:      General: He is not in acute distress.    Appearance: Normal appearance. He is obese. He is not ill-appearing.  HENT:     Head: Normocephalic and atraumatic.  Eyes:     General: No scleral icterus. Cardiovascular:     Rate and Rhythm: Normal rate and regular rhythm.     Heart sounds: No murmur heard. Pulmonary:     Comments: Breathing comfortably on room air, no conversational dyspnea.  No coughing or sneezing observed.  Clear to auscultation bilaterally. Abdominal:  General: There is no distension.     Palpations: Abdomen is soft.     Tenderness: There is no abdominal tenderness.  Musculoskeletal:        General: No swelling or deformity.     Cervical back: Neck supple.  Lymphadenopathy:     Cervical: No cervical adenopathy.  Skin:    General: Skin is warm and dry.     Findings: No rash.  Neurological:     General: No focal deficit present.     Mental Status: He is alert.     Coordination: Coordination normal.  Psychiatric:        Mood and Affect: Mood normal.        Behavior: Behavior normal.    Labs personally reviewed CBC    Component Value Date/Time   WBC 10.9 (H) 04/19/2021 1505   RBC 4.64 04/19/2021 1505   HGB 13.0 04/19/2021 1505   HGB 13.1 11/09/2019 0831   HCT 41.8 04/19/2021 1505   HCT 40.5 11/09/2019 0831   PLT 241 04/19/2021 1505   PLT 224 11/09/2019 0831   MCV 90.1 04/19/2021 1505   MCV 84 11/09/2019 0831   MCH 28.0 04/19/2021 1505   MCHC 31.1 04/19/2021 1505   RDW 16.2 (H) 04/19/2021 1505   RDW  14.8 11/09/2019 0831   LYMPHSABS 2.0 03/28/2021 0730   LYMPHSABS 2.0 04/23/2017 1012   MONOABS 1.2 (H) 03/28/2021 0730   EOSABS 0.3 03/28/2021 0730   EOSABS 0.3 04/23/2017 1012   BASOSABS 0.1 03/28/2021 0730   BASOSABS 0.1 04/23/2017 1012    CHEMISTRY No results for input(s): NA, K, CL, CO2, GLUCOSE, BUN, CREATININE, CALCIUM, MG, PHOS in the last 168 hours. CrCl cannot be calculated (Patient's most recent lab result is older than the maximum 21 days allowed.).   Chest Imaging- films reviewed: CXR, 2 view 04/30/2019-ICD in place, ectatic aorta versus hiatal hernia.  Pulmonary Functions Testing Results: PFT Results Latest Ref Rng & Units 01/28/2017  FVC-Pre L 2.68  FVC-Predicted Pre % 76  FVC-Post L 2.59  FVC-Predicted Post % 73  Pre FEV1/FVC % % 79  Post FEV1/FCV % % 82  FEV1-Pre L 2.12  FEV1-Predicted Pre % 85  FEV1-Post L 2.13  DLCO uncorrected ml/min/mmHg 16.31  DLCO UNC% % 57  DLVA Predicted % 81  TLC L 4.67  TLC % Predicted % 72  RV % Predicted % 74   2018- No significant obstruction or bronchodilator reversibility. Mild restriction, moderate diffusion impairment.  Spirometry 09/24/2018: FVC 2.26 (67%) FEV1 1.76 (76%) Ratio 78   Echocardiogram 04/29/2019: LVEF 35 to 40%, moderate LVH, diastolic dysfunction.  Severe hypokinesis of LV anterior wall, anteroseptal wall, and apical segment.  Mildly dilated LA, reduced RV systolic function.  Mildly dilated RA.  Mild mitral valve thickening, aortic valve replacement present-TAVR.  Mild dilation of ascending aorta.  Heart Catheterization 08/27/2016:  There is moderate aortic valve stenosis. SVG graft was visualized by angiography and is normal in caliber. 1st Mrg lesion, 65 %stenosed. Ost LM to LM lesion, 100 %stenosed. SVG graft was visualized by angiography and is normal in caliber. LIMA graft was visualized by angiography and is normal in caliber. Prox RCA to Mid RCA lesion, 100 %stenosed. SVG graft was visualized by  angiography and is normal in caliber and anatomically normal. Hemodynamic findings consistent with mild pulmonary hypertension.   1. 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  2. The Left  main is occluded at the ostium.  3. The LAD fills from the patent IMA graft and the Diagonal fills from the patent vein graft 4. The Circumflex fills from the patent vein graft to the OM. There is a moderate stenosis in the proximal segment of the OM branch proximal to the insertion of the vein graft.  5. The distal RCA fills from the patent vein graft.  6. Moderate AS 7. Elevated filling pressures    Assessment & Plan:     ICD-10-CM   1. Cough  R05.9 Azelastine HCl 0.15 % SOLN    fluticasone (FLONASE) 50 MCG/ACT nasal spray    benzonatate (TESSALON) 200 MG capsule    2. Vasomotor rhinitis  J30.0 Azelastine HCl 0.15 % SOLN    fluticasone (FLONASE) 50 MCG/ACT nasal spray    benzonatate (TESSALON) 200 MG capsule      Allergic rhinitis, possibly also vasomotor rhinitis causing chronic cough --Increase Flonase to twice a day and and continue Astelin spray twice a day, both refilled today. -Continue Xyzal in the morning, Zyrtec, montelukast  -Okay to continue Tessalon as needed, refilled today -Optimize GERD management; continue PPI and H2 blocker.  Recommend modest weight loss and dietary modification.  Lung restriction, likely due to body habitus.  Dyspnea on exertion likely due to underlying cardiac disease, body habitus. Stable.   RTC in 1 year   Current Outpatient Medications:    allopurinol (ZYLOPRIM) 100 MG tablet, Take 1 tablet (100 mg total) by mouth daily., Disp: 90 tablet, Rfl: 3   aspirin 81 MG tablet, Take 1 tablet (81 mg total) by mouth daily., Disp: 30 tablet, Rfl: 0   atorvastatin (LIPITOR) 20 MG tablet, TAKE 1 TABLET ONCE DAILY., Disp: 90 tablet, Rfl: 0   colchicine 0.6 MG tablet, Take 1 tablet (0.6 mg total)  by mouth daily., Disp: 30 tablet, Rfl: 0   dabigatran (PRADAXA) 150 MG CAPS capsule, TAKE (1) CAPSULE TWICE DAILY., Disp: 180 capsule, Rfl: 3   diphenhydrAMINE (BENADRYL) 25 MG tablet, Take 25 mg by mouth at bedtime. , Disp: , Rfl:    ENTRESTO 97-103 MG, TAKE 1 TABLET BY MOUTH TWICE DAILY., Disp: 180 tablet, Rfl: 2   EPINEPHrine 0.3 mg/0.3 mL IJ SOAJ injection, Inject 0.3 mLs (0.3 mg total) into the muscle as needed for anaphylaxis., Disp: 2 Device, Rfl: 1   famotidine (PEPCID) 20 MG tablet, Take 1 tablet (20 mg total) by mouth at bedtime., Disp: 90 tablet, Rfl: 3   fluorouracil (EFUDEX) 5 % cream, Apply 1 application topically 2 (two) times daily as needed (rash)., Disp: , Rfl:    fluticasone (FLONASE) 50 MCG/ACT nasal spray, Place 1 spray into both nostrils in the morning and at bedtime., Disp: 16 g, Rfl: 11   furosemide (LASIX) 80 MG tablet, Take 1 tablet (80 mg total) by mouth daily., Disp: 90 tablet, Rfl: 3   hydrOXYzine (ATARAX/VISTARIL) 10 MG tablet, Take 10 mg by mouth at bedtime., Disp: , Rfl:    isosorbide mononitrate (IMDUR) 30 MG 24 hr tablet, TAKE ONE TABLET ONCE DAILY, Disp: 90 tablet, Rfl: 0   ketotifen (ZADITOR) 0.025 % ophthalmic solution, Apply 1 drop to eye daily as needed (itchy eyes). , Disp: , Rfl:    levocetirizine (XYZAL) 5 MG tablet, Take 5 mg by mouth daily., Disp: , Rfl:    levothyroxine (SYNTHROID) 100 MCG tablet, Take 1 tablet (100 mcg total) by mouth daily., Disp: 90 tablet, Rfl: 3   meclizine (ANTIVERT) 25 MG tablet, Take 1  tablet (25 mg total) by mouth every 4 (four) hours as needed for dizziness., Disp: 60 tablet, Rfl: 2   methocarbamol (ROBAXIN) 500 MG tablet, Take 1 tablet (500 mg total) by mouth every 8 (eight) hours as needed for muscle spasms., Disp: 30 tablet, Rfl: 0   metoCLOPramide (REGLAN) 10 MG tablet, TAKE ONE TABLET BY MOUTH EVERY MORNING WITH BREAKFAST, Disp: 30 tablet, Rfl: 2   metolazone (ZAROXOLYN) 2.5 MG tablet, Take 1 tablet by mouth twice per  week., Disp: 24 tablet, Rfl: 3   metoprolol succinate (TOPROL-XL) 50 MG 24 hr tablet, Take 1.5 tablets (75 mg total) by mouth daily. Take with or immediately following a meal., Disp: 135 tablet, Rfl: 1   montelukast (SINGULAIR) 10 MG tablet, TAKE 1 TABLET ONCE DAILY IN THE EVENING., Disp: 90 tablet, Rfl: 3   Multiple Vitamins-Minerals (PRESERVISION AREDS PO), Take 2 tablets by mouth every morning., Disp: , Rfl:    omeprazole (PRILOSEC) 20 MG capsule, TAKE (1) CAPSULE TWICE DAILY., Disp: 180 capsule, Rfl: 3   oxyCODONE (OXY IR/ROXICODONE) 5 MG immediate release tablet, Take 1 tablet (5 mg total) by mouth every 12 (twelve) hours as needed for severe pain., Disp: 30 tablet, Rfl: 0   potassium bicarbonate (K-LYTE) 25 MEQ disintegrating tablet, Take 25 mEq by mouth 2 (two) times daily., Disp: , Rfl:    potassium chloride SA (KLOR-CON) 20 MEQ tablet, Take 2 tablets (40 mEq total) by mouth daily. Take 2 tablets daily alternating with 1 tablet every other day, Disp: 180 tablet, Rfl: 3   spironolactone (ALDACTONE) 25 MG tablet, TAKE ONE TABLET BY MOUTH EVERY DAY., Disp: 90 tablet, Rfl: 1   triamcinolone cream (KENALOG) 0.1 %, Apply 1 application topically daily as needed for dry skin., Disp: , Rfl:    valACYclovir (VALTREX) 500 MG tablet, Take 500 mg by mouth 2 (two) times daily as needed (cold sores)., Disp: , Rfl:    Azelastine HCl 0.15 % SOLN, Place 2 sprays into the nose in the morning and at bedtime., Disp: 30 mL, Rfl: 11   benzonatate (TESSALON) 200 MG capsule, Take 1 capsule (200 mg total) by mouth 2 (two) times daily as needed for cough., Disp: 180 capsule, Rfl: 3    Lanier Clam, MD [facility] 09/04/2021 1:08 PM

## 2021-09-04 NOTE — Patient Instructions (Signed)
Nice to meet you  I refilled the azelastine, flonase, and tessalon perles  Continue all the other medications  Return in 1 year or sooner as needed with Dr. Silas Flood

## 2021-09-05 NOTE — Progress Notes (Signed)
EPIC Encounter for ICM Monitoring  Patient Name: Justin [name] is a 88 y.o. male Date: 09/05/2021 Primary Care Physican: Laurey Morale, MD Primary Cardiologist: Angelena Form Electrophysiologist: Vergie Living Pacing: 70% Effective 64.4%  08/23/2021 Office Weight: 273 lbs  Clinical Status Since 24-Aug-2021  AT/AF 6 Time in AT/AF 1.2 hr/day (5.0%) Longest AT/AF 11 hours                                                            Spoke with patient and heart failure questions reviewed.  He reports feeling better since fluid accumulation is resolving.  Metolazone was discontinued at last OV with Coletta Memos, NP and advised to limit fluid intake to 48 ounces daily.    Optivol thoracic impedance suggesting fluid levels returned to normal.  Message sent to device clinic triage 12/6 to review BiV Pacing.   Prescribed: Furosemide 80 mg take 1 tablet every day.  Metolazone 2.5 mg Take 1 tablet by mouth twice per week. KLC 20 mEq Take 2 tablets (40 mEq total) by mouth daily. Take 2 tablets daily alternating with 1 tablet every other day Spironolactone 25 mg take 1 tablet daily   Labs: 07/09/2021 Creatinine 1.08, BUN 22, Potassium 4.7, Sodium 137, GFR 67 06/28/2021 Creatinine 1.39, BUN 32, Potassium 5.0, Sodium 139, GFR 50  06/18/2021 Creatinine 1.32, BUN 33, Potassium 5.0, Sodium 136, GFR 53  04/19/2021 Creatinine 1.07, BUN 25, Potassium 4.8, Sodium 135, GFR >60 03/28/2021 Creatinine 1.24, BUN 31, Potassium 4.7, Sodium 135, GFR 53.29 11/14/2020 Creatinine 1.05, BUN 25, Potassium 4.6, Sodium 137, GFR 65.23 A complete set of results can be found in Results Review.   Recommendations:  No changes and encouraged to call if experiencing any fluid symptoms.   Follow-up plan: ICM clinic phone appointment on 10/08/2021.   91 day device clinic remote transmission 10/07/2021.     EP/Cardiology Office Visits:  10/23/2021 with with Ermalinda Barrios, NP.     Copy of ICM check sent to Dr. [name].    3 month  ICM trend: 09/03/2021.    12-14 Month ICM trend:     [name], RN 09/05/2021 2:28 PM

## 2021-09-06 ENCOUNTER — Other Ambulatory Visit: Payer: Self-pay

## 2021-09-06 ENCOUNTER — Other Ambulatory Visit: Payer: Medicare Other

## 2021-09-06 DIAGNOSIS — I5022 Chronic systolic (congestive) heart failure: Secondary | ICD-10-CM | POA: Diagnosis not present

## 2021-09-06 DIAGNOSIS — I428 Other cardiomyopathies: Secondary | ICD-10-CM

## 2021-09-06 DIAGNOSIS — Z79899 Other long term (current) drug therapy: Secondary | ICD-10-CM | POA: Diagnosis not present

## 2021-09-06 LAB — BASIC METABOLIC PANEL
BUN/Creatinine Ratio: 23 (ref 10–24)
BUN: 31 mg/dL — ABNORMAL HIGH (ref 8–27)
CO2: 21 mmol/L (ref 20–29)
Calcium: 8.4 mg/dL — ABNORMAL LOW (ref 8.6–10.2)
Chloride: 101 mmol/L (ref 96–106)
Creatinine, Ser: 1.33 mg/dL — ABNORMAL HIGH (ref 0.76–1.27)
Glucose: 114 mg/dL — ABNORMAL HIGH (ref 70–99)
Potassium: 4.7 mmol/L (ref 3.5–5.2)
Sodium: 136 mmol/L (ref 134–144)
eGFR: 52 mL/min/{1.73_m2} — ABNORMAL LOW (ref >59)

## 2021-09-11 ENCOUNTER — Ambulatory Visit (HOSPITAL_COMMUNITY): Payer: Medicare Other | Attending: Internal Medicine

## 2021-09-11 ENCOUNTER — Other Ambulatory Visit: Payer: Self-pay

## 2021-09-11 DIAGNOSIS — I428 Other cardiomyopathies: Secondary | ICD-10-CM | POA: Diagnosis not present

## 2021-09-11 DIAGNOSIS — I5022 Chronic systolic (congestive) heart failure: Secondary | ICD-10-CM | POA: Diagnosis not present

## 2021-09-11 LAB — ECHOCARDIOGRAM COMPLETE
AR max vel: 1.16 cm2
AV Area VTI: 1.08 cm2
AV Area mean vel: 1.17 cm2
AV Mean grad: 10 mmHg
AV Peak grad: 18 mmHg
Ao pk vel: 2.12 m/s
Area-P 1/2: 3.56 cm2
S' Lateral: 3.1 cm

## 2021-09-11 MED ORDER — PERFLUTREN LIPID MICROSPHERE
1.0000 mL | INTRAVENOUS | Status: AC | PRN
  Administered 2021-09-11: 1 mL via INTRAVENOUS

## 2021-09-17 ENCOUNTER — Encounter: Payer: Self-pay | Admitting: Family Medicine

## 2021-09-17 ENCOUNTER — Telehealth (INDEPENDENT_AMBULATORY_CARE_PROVIDER_SITE_OTHER): Payer: Medicare Other | Admitting: Family Medicine

## 2021-09-17 DIAGNOSIS — J40 Bronchitis, not specified as acute or chronic: Secondary | ICD-10-CM | POA: Diagnosis not present

## 2021-09-17 MED ORDER — HYDROCODONE BIT-HOMATROP MBR 5-1.5 MG/5ML PO SOLN: 5.0000 mL | ORAL | 0 refills | Status: DC | PRN

## 2021-09-17 MED ORDER — AZITHROMYCIN 250 MG PO TABS: ORAL_TABLET | ORAL | 0 refills | Status: DC

## 2021-09-17 NOTE — Progress Notes (Signed)
° °  Subjective:    Patient ID: Justin [name], male    DOB: [birth year], 88 y.o.   MRN: [id number]  HPI Virtual Visit via Telephone Note  I connected with the patient on 09/17/21 at  1:45 PM EST by telephone and verified that I am speaking with the correct person using two identifiers.   I discussed the limitations, risks, security and privacy concerns of performing an evaluation and management service by telephone and the availability of in person appointments. I also discussed with the patient that there may be a patient responsible charge related to this service. The patient expressed understanding and agreed to proceed.  Location patient: home Location provider: work or home office Participants present for the call: patient, provider Patient did not have a visit in the prior 7 days to address this/these issue(s).   History of Present Illness: Here for 5 days of coughing and chest congestion which got worse last night. No fever or SOB or chest pain. No headache or ST or body aches. His wife has similar symptoms. He has not tested for the Covid-19 virus, but his wife tested negative this morning. Drinking fluids, using his inhalers, and taking Benzonatate several times a day.    Observations/Objective: Patient sounds cheerful and well on the phone. I do not appreciate any SOB. Speech and thought processing are grossly intact. Patient reported vitals:  Assessment and Plan: Bronchitis, treat with a Zpack. Add Hycodan for the cough.  Use Mucinex BID. [name], MD   Follow Up Instructions:     [phone] 5-10 [phone] 11-20 [id number] 21-30 I did not refer this patient for an OV in the next 24 hours for this/these issue(s).  I discussed the assessment and treatment plan with the patient. The patient was provided an opportunity to ask questions and all were answered. The patient agreed with the plan and demonstrated an understanding of the instructions.   The patient was advised to call  back or seek an in-person evaluation if the symptoms worsen or if the condition fails to improve as anticipated.  I provided 16 minutes of non-face-to-face time during this encounter.   [name], MD     Review of Systems     Objective:   Physical Exam        Assessment & Plan:

## 2021-09-21 ENCOUNTER — Telehealth: Payer: Self-pay | Admitting: Family Medicine

## 2021-09-21 ENCOUNTER — Other Ambulatory Visit: Payer: Self-pay

## 2021-09-21 MED ORDER — DOXYCYCLINE HYCLATE 100 MG PO TABS: 100.0000 mg | ORAL_TABLET | Freq: Two times a day (BID) | ORAL | 0 refills | Status: AC

## 2021-09-21 NOTE — Telephone Encounter (Signed)
Spoke with pt aware that Rx for Doxycycline was sent to his pharmacy per DR Sarajane Jews

## 2021-09-21 NOTE — Telephone Encounter (Signed)
Patient was prescribed Z-pak on 09/17/21.   Please advise

## 2021-09-21 NOTE — Telephone Encounter (Signed)
Call in Doxycycline 100 mg BID for 10 days  

## 2021-09-21 NOTE — Telephone Encounter (Signed)
Patient called in stating that the medication that was prescribed to him on 01/23 isn't working. He isn't feeling any better than he did during the time of his visit.  Patient could be contacted at [phone].  Please advise.

## 2021-09-25 ENCOUNTER — Telehealth: Payer: Self-pay | Admitting: Family Medicine

## 2021-09-25 ENCOUNTER — Ambulatory Visit (INDEPENDENT_AMBULATORY_CARE_PROVIDER_SITE_OTHER): Payer: Medicare Other | Admitting: Family Medicine

## 2021-09-25 ENCOUNTER — Encounter: Payer: Self-pay | Admitting: Family Medicine

## 2021-09-25 VITALS — BP 80/48 | HR 92 | Temp 97.7°F | Wt 265.0 lb

## 2021-09-25 DIAGNOSIS — J069 Acute upper respiratory infection, unspecified: Secondary | ICD-10-CM | POA: Diagnosis not present

## 2021-09-25 MED ORDER — METHYLPREDNISOLONE 4 MG PO TBPK: ORAL_TABLET | ORAL | 0 refills | Status: DC

## 2021-09-25 NOTE — Telephone Encounter (Signed)
Patient states Dr Sarajane Jews said it was ok for his wife Benuel Ly to become a patient of his.  Is this ok?

## 2021-09-25 NOTE — Progress Notes (Signed)
° °  Subjective:    Patient ID: ALBINO BUFFORD, male    DOB: Dec 22, 1933, 88 y.o.   MRN: [id number]  HPI Here for continued coughing. This started about 4 weeks ago. The cough is dry. No chgst pain or SOB. No fever. We had a virtual visit with him on 09-17-21 and we gavce him a Zpack . This did not help so we followed it with 10 days of Doxycycline. He feels a little better now, but the cough continues. Using Benzonatate, Hycodan syrup, and inhalers. His wife has had the same thing.    Review of Systems  Constitutional: Negative.   HENT: Negative.    Eyes: Negative.   Respiratory:  Positive for cough. Negative for choking, shortness of breath and wheezing.   Cardiovascular: Negative.       Objective:   Physical Exam Constitutional:      Appearance: Normal appearance. He is not ill-appearing.  Cardiovascular:     Rate and Rhythm: Normal rate and regular rhythm.     Pulses: Normal pulses.     Heart sounds: Normal heart sounds.  Pulmonary:     Effort: Pulmonary effort is normal. No respiratory distress.     Breath sounds: Normal breath sounds. No stridor. No wheezing, rhonchi or rales.  Neurological:     Mental Status: He is alert.          Assessment & Plan:  Viral URI, possibly due to RSV. We will treat with a Medrol dose pack. Recheck as needed.  [name], MD

## 2021-09-25 NOTE — Telephone Encounter (Signed)
Patient calling in with respiratory symptoms: Shortness of breath, chest pain, palpitations or other red words send to Triage  Does the patient have a fever over 100, cough, congestion, sore throat, runny nose, lost of taste/smell (please list symptoms that patient has)?cough, sinus drainage  What date did symptoms start?09-17-2021 (If over 5 days ago, pt may be scheduled for in person visit)  Have you tested for Covid in the last 5 days? No   If yes, was it positive []  OR negative [] ? If positive in the last 5 days, please schedule virtual visit now. If negative, schedule for an in person OV with the next available provider if PCP has no openings. Please also let patient know they will be tested again (follow the script below)  "you will have to arrive 44mins prior to your appt time to be Covid tested. Please park in back of office at [facility] & call [phone] to let the staff know you have arrived. A staff member will meet you at your car to do a rapid covid test. Once the test has resulted you will be notified by phone of your results to determine if appt will remain an in person visit or be converted to a virtual/phone visit. If you arrive less than 53mins before your appt time, your visit will be automatically converted to virtual & any recommended testing will happen AFTER the visit." Pt has office visit w/dr fry 09-25-2021 at 215 pm  [place]  If no availability for virtual visit in office,  please schedule another [place] office  If no availability at another [place] office, please instruct patient that they can schedule an evisit or virtual visit through their mychart account. Visits up to 8pm  patients can be seen in office 5 days after positive COVID test

## 2021-09-25 NOTE — Telephone Encounter (Signed)
Please advise if willing to see pt wife.

## 2021-09-26 NOTE — Telephone Encounter (Signed)
Spoke with pt husband scheduled appointment with Dr Sarajane Jews for the wife on 10/05/2021

## 2021-09-26 NOTE — Telephone Encounter (Signed)
Yes I agreed to see her  

## 2021-10-07 LAB — CUP PACEART REMOTE DEVICE CHECK
Battery Remaining Longevity: 15 mo
Battery Voltage: 2.89 V
Brady Statistic RA Percent Paced: 8.92 %
Brady Statistic RV Percent Paced: 60.95 %
Date Time Interrogation Session: 20230212082505
HighPow Impedance: 85 Ohm
Implantable Lead Implant Date: 20180907
Implantable Lead Implant Date: 20180907
Implantable Lead Implant Date: 20180907
Implantable Lead Location: 753858
Implantable Lead Location: 753859
Implantable Lead Location: 753860
Implantable Lead Model: 4396
Implantable Lead Model: 5076
Implantable Pulse Generator Implant Date: 20180907
Lead Channel Impedance Value: 380 Ohm
Lead Channel Impedance Value: 380 Ohm
Lead Channel Impedance Value: 456 Ohm
Lead Channel Impedance Value: 570 Ohm
Lead Channel Impedance Value: 646 Ohm
Lead Channel Impedance Value: 988 Ohm
Lead Channel Pacing Threshold Amplitude: 0.75 V
Lead Channel Pacing Threshold Amplitude: 0.75 V
Lead Channel Pacing Threshold Amplitude: 1.125 V
Lead Channel Pacing Threshold Pulse Width: 0.4 ms
Lead Channel Pacing Threshold Pulse Width: 0.4 ms
Lead Channel Pacing Threshold Pulse Width: 1 ms
Lead Channel Sensing Intrinsic Amplitude: 1.25 mV
Lead Channel Sensing Intrinsic Amplitude: 1.25 mV
Lead Channel Sensing Intrinsic Amplitude: 9.375 mV
Lead Channel Sensing Intrinsic Amplitude: 9.375 mV
Lead Channel Setting Pacing Amplitude: 1.5 V
Lead Channel Setting Pacing Amplitude: 2 V
Lead Channel Setting Pacing Amplitude: 2.5 V
Lead Channel Setting Pacing Pulse Width: 0.4 ms
Lead Channel Setting Pacing Pulse Width: 1 ms
Lead Channel Setting Sensing Sensitivity: 0.3 mV

## 2021-10-08 ENCOUNTER — Ambulatory Visit (INDEPENDENT_AMBULATORY_CARE_PROVIDER_SITE_OTHER): Payer: Medicare Other

## 2021-10-08 DIAGNOSIS — I428 Other cardiomyopathies: Secondary | ICD-10-CM

## 2021-10-09 ENCOUNTER — Ambulatory Visit (INDEPENDENT_AMBULATORY_CARE_PROVIDER_SITE_OTHER): Payer: Medicare Other

## 2021-10-09 DIAGNOSIS — I5022 Chronic systolic (congestive) heart failure: Secondary | ICD-10-CM

## 2021-10-09 DIAGNOSIS — Z95810 Presence of automatic (implantable) cardiac defibrillator: Secondary | ICD-10-CM | POA: Diagnosis not present

## 2021-10-10 NOTE — Progress Notes (Signed)
Remote ICD transmission.   

## 2021-10-11 NOTE — Progress Notes (Signed)
EPIC Encounter for ICM Monitoring  Patient Name: Justin [name] is a 88 y.o. male Date: 10/11/2021 Primary Care Physican: Laurey Morale, MD Primary Cardiologist: Angelena Form Electrophysiologist: Vergie Living Pacing: 70% Effective 64.4%  10/11/2021 Weight: 262 lbs    Since 03-Sep-2021 AT/AF 650 Time in AT/AF 23.9 hr/day (99.8%) Longest AT/AF 4 days  Clinical Status   Since 24-Aug-2021  AT/AF   6 Time in AT/AF  1.2 hr/day (5.0%) Longest AT/AF 11 hours                                                              Spoke with patient and heart failure questions reviewed.  He reports he is feeling well and drinking < 40 oz fluid daily Coletta Memos, NP recommended 48 daily).     Optivol thoracic impedance suggesting possible dehydration starting 1/31.   Biv Pacing decreased from 70% Effective 64.4% on previous report to 60.9% effective 52.1%.   Also AT/AF burden increased from 5% to 99.8%.  Message send to device clinic triage to review.     Prescribed: Furosemide 80 mg take 1 tablet every day.  Metolazone 2.5 mg Take 1 tablet by mouth twice per week. KLC 20 mEq Take 2 tablets (40 mEq total) by mouth daily. Take 2 tablets daily alternating with 1 tablet every other day Spironolactone 25 mg take 1 tablet daily   Labs: 09/06/2021 Creatinine 1.33, BUN 31, Potassium 4.7, Sodium 136, GFR 52 07/09/2021 Creatinine 1.08, BUN 22, Potassium 4.7, Sodium 137, GFR 67 06/28/2021 Creatinine 1.39, BUN 32, Potassium 5.0, Sodium 139, GFR 50  A complete set of results can be found in Results Review.   Recommendations:  Advised to increase fluid intake to 48 to 50 oz daily to avoid dehydration.  No changes and encouraged to call if experiencing any fluid symptoms.   Follow-up plan: ICM clinic phone appointment on 11/12/2021.   91 day device clinic remote transmission 01/07/2022.     EP/Cardiology Office Visits:  10/23/2021 with with Ermalinda Barrios, NP.  Recall 08/23/2022 with Dr [name].   Copy of ICM  check sent to Dr. [name].    3 month ICM trend: 10/09/2021.    12-14 Month ICM trend:     [name], RN 10/11/2021 12:16 PM

## 2021-10-11 NOTE — Progress Notes (Signed)
Simone Curia, RN  Gurshaan Matsuoka Panda, RN Leonie Man,   The 10/07/21 report is scheduled and exported to Dr. [name] for review. We will await his recommendations.   Thanks,  Marliss Czar

## 2021-10-15 NOTE — Progress Notes (Signed)
Cardiology Office Note    Date:  10/23/2021   ID:  Jakell, Trusty 03/08/1934, MRN [id number]   PCP:  Laurey Morale, MD   [place]  Cardiologist:  [name], MD   Advanced Practice Provider:  No care team member to display Electrophysiologist:  Virl Axe, MD   [phone]   Chief Complaint  Patient presents with   Follow-up    History of Present Illness:  Justin [name] is a 88 y.o. male with a hx of hypertension, hyperlipidemia, severe aortic stenosis status post TAVR, paroxysmal atrial fibrillation, chronic diastolic and systolic CHF, coronary artery disease status post CABG x4, syncope, junctional bradycardia, PVCs, nonsustained VT, LBBB, and ICD implantation.   He underwent cardiac catheterization 1/18 which showed all 4 of his grafts to be patent.  CRT ICD 9/18 implanted because of syncope  and new left bundle branch block following TAVR 02/2017 ; hx of Afib on dabigitran and ASA;  recurrent Afib with DCCV 8/22     Patient saw Dr. [name] 08/23/21 and was fluid overloaded and increased metolazone 2.5 mg twice a week and and ordered echo to help guide antiarrhythmic therapy for VT. Echo 09/11/21 improved LVEF 40-45%.  OptiVol thoracic impedance 10/09/2021 suggest possible dehydration starting 09/25/2021.  BiV pacing decreased from 70% effective to 64% and atrial tach A-fib burden increased from 5% to 99%.  He was advised to stay hydrated and drink 48 to 50 ounces daily.  Patient comes in for f/u. Drinking 48 ounces of water daily. Intentionally losing weight. Doesn't feel Afib. Chronic DOE unchanged. His wife has had a lot of medical issues in the past year. Unable to exercise in several months.   Past Medical History:  Diagnosis Date   Age-related macular degeneration, dry, both eyes    Allergic    "24/7; 365 days/year; I'm allergic to pollens, dust, all southern grasses/trees, mold, mildue, cats, dogs" (01/07/2017)   Anal fissure     Asthma    sees [name]    Benign prostatic hypertrophy    (sees Dr. Denman George   CAD (coronary artery disease)    a. s/p CABG 2007. b. Cath 08/2016 - 4/4 patent grafts.   Carotid bruit    carotid u/s 10/10: 0.39% bilaterally   Chronic combined systolic and diastolic CHF (congestive heart failure) (HCC)    Complication of anesthesia 1980s   "w/anal cyst OR, he gave me a saddle block then put a narcotic in spinal cord; had a severe reaction to that" (01/07/2017)   Congestive heart failure (CHF) [facility])    ED (erectile dysfunction)    Family history of adverse reaction to anesthesia    "daughter wakes up during OR" (01/07/2017)   GERD (gastroesophageal reflux disease)    Gout    HTN (hypertension)    Hx of colonic polyps    (sees Dr. Henrene Pastor)   Hyperlipidemia    Hypothyroidism    Moderate to severe aortic stenosis    a. s/p TAVR 02/2017.   Myocardial infarction [facility]) ~ 2000   Obesity    Osteoarthritis    "was in my knees, hands" (01/07/2017 )   PAF (paroxysmal atrial fibrillation) ([place])    a. documented post TAVR.   Precancerous skin lesion    (sees Dr. Allyson Sabal)   Prostate cancer [facility]) dx'd ~ 2014   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   29 mm Edwards Sapien 3 transcatheter  heart valve placed via percutaneous right transfemoral approach    Past Surgical History:  Procedure Laterality Date   ANUS SURGERY     "opened it back up cause it wouldn't heal; wound up w/a fissure" (01/07/2017)   BIV ICD INSERTION CRT-D N/A 05/02/2017   Procedure: BIV ICD INSERTION CRT-D;  Surgeon: Deboraha Sprang, MD;  Location: [place] CV LAB;  Service: Cardiovascular;  Laterality: N/A;   CARDIAC CATHETERIZATION  10/29/2005   CARDIAC CATHETERIZATION N/A 08/27/2016   Procedure: Right/Left Heart Cath and Coronary/Graft Angiography;  Surgeon: Burnell Blanks, MD;  Location: [place] CV LAB;  Service: Cardiovascular;  Laterality: N/A;   CARDIOVERSION  N/A 04/24/2021   Procedure: CARDIOVERSION;  Surgeon: Fay Records, MD;  Location: [facility] ENDOSCOPY;  Service: Cardiovascular;  Laterality: N/A;   CATARACT EXTRACTION W/ INTRAOCULAR LENS  IMPLANT, BILATERAL Bilateral    CATARACT EXTRACTION, BILATERAL  2012   COLONOSCOPY  06/30/2008   no repeats needed    COLONOSCOPY     had 3 or 4 in the past    CORONARY ARTERY BYPASS GRAFT  2007   "CABG X4"   CYST EXCISION PERINEAL  1980s   HAMMER TOE SURGERY Bilateral    JOINT REPLACEMENT     KNEE ARTHROPLASTY  07/30/2011   Procedure: COMPUTER ASSISTED TOTAL KNEE ARTHROPLASTY;  Surgeon: Meredith Pel;  Location: [place];  Service: Orthopedics;  Laterality: Left;  left total knee arthroplasty   MASTECTOMY SUBCUTANEOUS Bilateral    MULTIPLE TOOTH EXTRACTIONS     ORIF FINGER / THUMB FRACTURE Right ~ 1980   "repair of thumb injury"   PROSTATE BIOPSY     REPLACEMENT TOTAL KNEE BILATERAL Bilateral 2012   TEE WITHOUT CARDIOVERSION N/A 03/04/2017   Procedure: TRANSESOPHAGEAL ECHOCARDIOGRAM (TEE);  Surgeon: Burnell Blanks, MD;  Location: [place];  Service: Open Heart Surgery;  Laterality: N/A;   TOTAL KNEE REVISION Right 05/18/2019   Procedure: RIGHT PATELLA REVISION/REMOVAL;  Surgeon: Meredith Pel, MD;  Location: [place];  Service: Orthopedics;  Laterality: Right;   TRANSCATHETER AORTIC VALVE REPLACEMENT, TRANSFEMORAL N/A 03/04/2017   Procedure: TRANSCATHETER AORTIC VALVE REPLACEMENT, TRANSFEMORAL;  Surgeon: Burnell Blanks, MD;  Location: [place];  Service: Open Heart Surgery;  Laterality: N/A;    Current Medications: Current Meds  Medication Sig   allopurinol (ZYLOPRIM) 100 MG tablet Take 1 tablet (100 mg total) by mouth daily.   aspirin 81 MG tablet Take 1 tablet (81 mg total) by mouth daily.   atorvastatin (LIPITOR) 20 MG tablet TAKE ONE TABLET ONCE DAILY   Azelastine HCl 0.15 % SOLN Place 2 sprays into the nose in the morning and at bedtime.   benzonatate (TESSALON) 200 MG capsule Take 1  capsule (200 mg total) by mouth 2 (two) times daily as needed for cough.   colchicine 0.6 MG tablet Take 1 tablet (0.6 mg total) by mouth daily.   dabigatran (PRADAXA) 150 MG CAPS capsule TAKE (1) CAPSULE TWICE DAILY.   diphenhydrAMINE (BENADRYL) 25 MG tablet Take 25 mg by mouth at bedtime.    ENTRESTO 97-103 MG TAKE 1 TABLET BY MOUTH TWICE DAILY.   EPINEPHrine 0.3 mg/0.3 mL IJ SOAJ injection Inject 0.3 mLs (0.3 mg total) into the muscle as needed for anaphylaxis.   famotidine (PEPCID) 20 MG tablet TAKE ONE TABLET BY MOUTH AT BEDTIME.   fluorouracil (EFUDEX) 5 % cream Apply 1 application topically 2 (two) times daily as needed (rash).   furosemide (LASIX) 80 MG tablet Take 1  tablet (80 mg total) by mouth daily.   HYDROcodone bit-homatropine (HYCODAN) 5-1.5 MG/5ML syrup Take 5 mLs by mouth every 4 (four) hours as needed for cough.   hydrOXYzine (ATARAX/VISTARIL) 10 MG tablet Take 10 mg by mouth at bedtime.   isosorbide mononitrate (IMDUR) 30 MG 24 hr tablet TAKE ONE TABLET ONCE DAILY   ketotifen (ZADITOR) 0.025 % ophthalmic solution Apply 1 drop to eye daily as needed (itchy eyes).    levocetirizine (XYZAL) 5 MG tablet Take 5 mg by mouth daily.   levothyroxine (SYNTHROID) 100 MCG tablet Take 1 tablet (100 mcg total) by mouth daily.   meclizine (ANTIVERT) 25 MG tablet Take 1 tablet (25 mg total) by mouth every 4 (four) hours as needed for dizziness.   methocarbamol (ROBAXIN) 500 MG tablet Take 1 tablet (500 mg total) by mouth every 8 (eight) hours as needed for muscle spasms.   metoCLOPramide (REGLAN) 10 MG tablet TAKE ONE TABLET BY MOUTH EVERY MORNING WITH BREAKFAST   metolazone (ZAROXOLYN) 2.5 MG tablet Take 1 tablet by mouth twice per week.   metoprolol succinate (TOPROL-XL) 50 MG 24 hr tablet Take 1.5 tablets (75 mg total) by mouth daily. Take with or immediately following a meal.   montelukast (SINGULAIR) 10 MG tablet TAKE 1 TABLET ONCE DAILY IN THE EVENING.   Multiple Vitamins-Minerals  (PRESERVISION AREDS PO) Take 2 tablets by mouth every morning.   omeprazole (PRILOSEC) 20 MG capsule TAKE (1) CAPSULE TWICE DAILY.   oxyCODONE (OXY IR/ROXICODONE) 5 MG immediate release tablet Take 1 tablet (5 mg total) by mouth every 12 (twelve) hours as needed for severe pain.   potassium chloride SA (KLOR-CON) 20 MEQ tablet Take 2 tablets (40 mEq total) by mouth daily. Take 2 tablets daily alternating with 1 tablet every other day   spironolactone (ALDACTONE) 25 MG tablet TAKE ONE TABLET BY MOUTH EVERY DAY.   triamcinolone cream (KENALOG) 0.1 % Apply 1 application topically daily as needed for dry skin.   valACYclovir (VALTREX) 500 MG tablet Take 500 mg by mouth 2 (two) times daily as needed (cold sores).   [DISCONTINUED] fluticasone (FLONASE) 50 MCG/ACT nasal spray Place 1 spray into both nostrils in the morning and at bedtime.   [DISCONTINUED] methylPREDNISolone (MEDROL DOSEPAK) 4 MG TBPK tablet As directed   [DISCONTINUED] potassium bicarbonate (K-LYTE) 25 MEQ disintegrating tablet Take 25 mEq by mouth 2 (two) times daily.     Allergies:   Peanut-containing drug products, Sulfonamide derivatives, Amlodipine, Eliquis [apixaban], Lisinopril, and Xarelto [rivaroxaban]   Social History   Socioeconomic History   Marital status: Married    Spouse name: Not on file   Number of children: 3   Years of education: Not on file   Highest education level: Not on file  Occupational History   Occupation: Clinical biochemist    Comment: builds malls  Tobacco Use   Smoking status: Former    Packs/day: 3.50    Years: 13.00    Pack years: 45.50    Types: Cigarettes    Quit date: 1963    Years since quitting: 60.2   Smokeless tobacco: Never   Tobacco comments:    Former smoker 04/19/21  Vaping Use   Vaping Use: Never used  Substance and Sexual Activity   Alcohol use: Not Currently    Alcohol/week: 7.0 standard drinks    Types: 7 Cans of beer per week    Comment: 1 beer daily after 5pm  (04/19/21)   Drug use: No   Sexual activity:  Never  Other Topics Concern   Not on file  Social History Narrative   FH of CAD, Male 1st degree relative less than age 42.   Social Determinants of Health   Financial Resource Strain: Low Risk    Difficulty of Paying Living Expenses: Not very hard  Food Insecurity: No Food Insecurity   Worried About Charity fundraiser in the Last Year: Never true   Ran Out of Food in the Last Year: Never true  Transportation Needs: No Transportation Needs   Lack of Transportation (Medical): No   Lack of Transportation (Non-Medical): No  Physical Activity: Insufficiently Active   Days of Exercise per Week: 4 days   Minutes of Exercise per Session: 30 min  Stress: No Stress Concern Present   Feeling of Stress : Not at all  Social Connections: Moderately Isolated   Frequency of Communication with Friends and Family: Three times a week   Frequency of Social Gatherings with Friends and Family: Three times a week   Attends Religious Services: Never   Active Member of Clubs or Organizations: No   Attends Archivist Meetings: Never   Marital Status: Married     Family History:  The patient's  family history includes Allergic rhinitis in his father; Asthma in his father; Breast cancer in his mother; Heart attack (age of onset: 9) in his father; Heart failure (age of onset: 70) in his mother; Uterine cancer in his mother.   ROS:   Please see the history of present illness.    ROS All other systems reviewed and are negative.   PHYSICAL EXAM:   VS:  BP (!) 90/56 (BP Location: Left Arm, Patient Position: Sitting, Cuff Size: Large)    Pulse 94    Ht 5\' 7"  (1.702 m)    Wt 268 lb 9.6 oz (121.8 kg)    SpO2 98%    BMI 42.07 kg/m   Physical Exam  GEN: Obese, in no acute distress  Neck: no JVD, carotid bruits, or masses Cardiac:RRR;  2/6 systolic murmur LSB  Respiratory:  clear to auscultation bilaterally, normal work of breathing GI: soft,  nontender, nondistended, + BS Ext: without cyanosis, clubbing, or edema, Good distal pulses bilaterally Neuro:  Alert and Oriented x 3, Psych: euthymic mood, full affect  Wt Readings from Last 3 Encounters:  10/23/21 268 lb 9.6 oz (121.8 kg)  09/25/21 265 lb (120.2 kg)  09/04/21 271 lb (122.9 kg)      Studies/Labs Reviewed:   EKG:  EKG is not ordered today.     Recent Labs: 03/28/2021: ALT 16; Pro B Natriuretic peptide (BNP) 354.0; TSH 5.39 04/19/2021: Hemoglobin 13.0; Platelets 241 09/06/2021: BUN 31; Creatinine, Ser 1.33; Potassium 4.7; Sodium 136   Lipid Panel    Component Value Date/Time   CHOL 124 03/28/2021 0730   CHOL 171 08/11/2018 0735   TRIG 161.0 (H) 03/28/2021 0730   TRIG 143 06/30/2006 1132   HDL 34.60 (L) 03/28/2021 0730   HDL 34 (L) 08/11/2018 0735   CHOLHDL 4 03/28/2021 0730   VLDL 32.2 03/28/2021 0730   LDLCALC 57 03/28/2021 0730   LDLCALC 68 03/21/2020 0943    Additional studies/ records that were reviewed today include:    Echo 09/11/21 IMPRESSIONS     1. Left ventricular ejection fraction, by estimation, is 40 to 45%. The  left ventricle has mildly decreased function. The left ventricle  demonstrates global hypokinesis. There is mild concentric left ventricular  hypertrophy. Left ventricular diastolic  function could not be evaluated.   2. Right ventricular systolic function is normal. The right ventricular  size is normal.   3. Left atrial size was severely dilated.   4. The mitral valve is normal in structure. No evidence of mitral valve  regurgitation. No evidence of mitral stenosis.   5. The aortic valve has been repaired/replaced. Aortic valve  regurgitation is not visualized. No aortic stenosis is present. There is a  29 mm Sapien prosthetic (TAVR) valve present in the aortic position.  Procedure Date: 03/13/2019. Aortic valve mean  gradient measures 10.0 mmHg. Aortic valve Vmax measures 2.12 m/s. Aortic  valve acceleration time measures  77 msec.   6. The inferior vena cava is normal in size with greater than 50%  respiratory variability, suggesting right atrial pressure of 3 mmHg.   Comparison(s): No significant change from prior study. Prior images  reviewed side by side.   FINDINGS Echocardiogram 04/29/2019   IMPRESSIONS     1. The left ventricle has moderately reduced systolic function, with an  ejection fraction of 35-40%. The cavity size was normal. There is  moderately increased left ventricular wall thickness. Left ventricular  diastolic Doppler parameters are consistent  with impaired relaxation. Indeterminate filling pressures.   2. Severe hypokinesis of the left ventricular, entire anterior wall,  anteroseptal wall and apical segment.   3. The right ventricle has moderately reduced systolic function. The  cavity was normal. There is no increase in right ventricular wall  thickness.   4. Left atrial size was mildly dilated.   5. Right atrial size was mildly dilated.   6. The mitral valve is abnormal. Mild thickening of the mitral valve  leaflet. There is mild to moderate mitral annular calcification present.   7. The tricuspid valve is grossly normal.   8. A 57mm an Wende Crease Sapien bioprosthetic aortic valve (TAVR)  valve is present in the aortic position. Procedure Date: 03/13/2019 Normal  aortic valve prosthesis.   9. No stenosis of the aortic valve.  10. The aorta is abnormal unless otherwise noted.  11. There is mild dilatation of the ascending aorta measuring 40 mm.  12. When compared to the prior study: 02/06/2018: LVEF 40-45%, TAVR mean  gradient 9 mmHg, aorta 3.9 cm.    Cardiac catheterization 08/27/16   There is moderate aortic valve stenosis. SVG graft was visualized by angiography and is normal in caliber. 1st Mrg lesion, 65 %stenosed. Ost LM to LM lesion, 100 %stenosed. SVG graft was visualized by angiography and is normal in caliber. LIMA graft was visualized by angiography and is  normal in caliber. Prox RCA to Mid RCA lesion, 100 %stenosed. SVG graft was visualized by angiography and is normal in caliber and anatomically normal. Hemodynamic findings consistent with mild pulmonary hypertension.   1. 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  2. The Left main is occluded at the ostium.  3. The LAD fills from the patent IMA graft and the Diagonal fills from the patent vein graft 4. The Circumflex fills from the patent vein graft to the OM. There is a moderate stenosis in the proximal segment of the OM branch proximal to the insertion of the vein graft.  5. The distal RCA fills from the patent vein graft.  6. Moderate AS 7. Elevated filling pressures   Recommendations: Will continue medical management of CAD. Will start Lasix 20 mg daily for mild volume  overload. No indication for AVR at this time.    Diagnostic Dominance: Right Intervention       Risk Assessment/Calculations:    CHA2DS2-VASc Score = 5   This indicates a 7.2% annual risk of stroke. The patient's score is based upon: CHF History: 1 HTN History: 1 Diabetes History: 0 Stroke History: 0 Vascular Disease History: 1 Age Score: 2 Gender Score: 0        ASSESSMENT:    1. Paroxysmal atrial fibrillation (HCC)   2. Coronary artery disease involving native coronary artery of native heart without angina pectoris   3. Ischemic cardiomyopathy   4. Essential hypertension   5. Other hyperlipidemia   6. S/P ICD (internal cardiac defibrillator) procedure   7. S/P TAVR (transcatheter aortic valve replacement)      PLAN:  In order of problems listed above:  PAF DCCV 04/24/21 but  increased burden on recent pacer check from 5%-99% with decrease in Biv Pacing 52% on Pradaxa, metoprolol. Rates 80-140/m.  Patient asymptomatic and recent echo shows severely dilated LA size. Discussed with Dr. [name] who recommends cardioversion.  Have called patient and reviewed risks and benefits of DCCV. Patient is agreeable. Hasn't missed any Pradaxa doses and labs drawn.  CAD CABG x 4 patent grafts 2018-no angina  ICM EF improved 40-45% on echo 08/2021-recent optival indicated dehydration. He's also trying to lose weight. Will decrease metolazone 2.5 mg once weekly and can take extra prn edema/weight gain. Check bmet and cbc today  HTN BP runs low  HLD LDL 57 03/28/21  VT ICD followed by Dr. [name]  S/P TAVR valve stable on recent echo.  Shared Decision Making/Informed Consent        Medication Adjustments/Labs and Tests Ordered: Current medicines are reviewed at length with the patient today.  Concerns regarding medicines are outlined above.  Medication changes, Labs and Tests ordered today are listed in the Patient Instructions below. Patient Instructions  Medication Instructions:  Your physician recommends that you continue on your current medications as directed. Please refer to the Current Medication list given to you today.  *If you need a refill on your cardiac medications before your next appointment, please call your pharmacy*   Lab Work: TODAY:  BMET & CBC  If you have labs (blood work) drawn today and your tests are completely normal, you will receive your results only by: [place] (if you have MyChart) OR A paper copy in the mail If you have any lab test that is abnormal or we need to change your treatment, we will call you to review the results.   Testing/Procedures: None ordered   Follow-Up: At [facility], you and your health needs are our priority.  As part of our continuing mission to provide you with exceptional heart care, we have created designated Provider Care Teams.  These Care Teams include your primary Cardiologist (physician) and Advanced Practice Providers (APPs -  Physician Assistants and Nurse Practitioners) who all work together to provide you with the care you need, when you  need it.  We recommend signing up for the patient portal called "MyChart".  Sign up information is provided on this After Visit Summary.  MyChart is used to connect with patients for Virtual Visits (Telemedicine).  Patients are able to view lab/test results, encounter notes, upcoming appointments, etc.  Non-urgent messages can be sent to your provider as well.   To learn more about what you can do with MyChart, go to [email or web address].    Your  next appointment:   6 month(s)  The format for your next appointment:   In Person  Provider:   [name], MD     Other Instructions    Signed, Ermalinda Barrios, PA-C  10/23/2021 2:57 PM    [facility] [place] [address], [place], [place]  [address] Phone: [phone]; Fax: [phone]

## 2021-10-20 ENCOUNTER — Other Ambulatory Visit: Payer: Self-pay | Admitting: Family Medicine

## 2021-10-23 ENCOUNTER — Encounter: Payer: Self-pay | Admitting: *Deleted

## 2021-10-23 ENCOUNTER — Other Ambulatory Visit: Payer: Self-pay

## 2021-10-23 ENCOUNTER — Ambulatory Visit: Payer: Medicare Other | Admitting: Physician Assistant

## 2021-10-23 ENCOUNTER — Encounter: Payer: Self-pay | Admitting: Physician Assistant

## 2021-10-23 DIAGNOSIS — I2510 Atherosclerotic heart disease of native coronary artery without angina pectoris: Secondary | ICD-10-CM

## 2021-10-23 DIAGNOSIS — I255 Ischemic cardiomyopathy: Secondary | ICD-10-CM

## 2021-10-23 DIAGNOSIS — Z95810 Presence of automatic (implantable) cardiac defibrillator: Secondary | ICD-10-CM | POA: Diagnosis not present

## 2021-10-23 DIAGNOSIS — E7849 Other hyperlipidemia: Secondary | ICD-10-CM | POA: Diagnosis not present

## 2021-10-23 DIAGNOSIS — Z952 Presence of prosthetic heart valve: Secondary | ICD-10-CM | POA: Diagnosis not present

## 2021-10-23 DIAGNOSIS — I480 Paroxysmal atrial fibrillation: Secondary | ICD-10-CM | POA: Diagnosis not present

## 2021-10-23 DIAGNOSIS — I10 Essential (primary) hypertension: Secondary | ICD-10-CM

## 2021-10-23 LAB — CBC
Hematocrit: 41.6 % (ref 37.5–51.0)
Hemoglobin: 13.3 g/dL (ref 13.0–17.7)
MCH: 27.9 pg (ref 26.6–33.0)
MCHC: 32 g/dL (ref 31.5–35.7)
MCV: 87 fL (ref 79–97)
Platelets: 261 10*3/uL (ref 150–450)
RBC: 4.77 x10E6/uL (ref 4.14–5.80)
RDW: 15 % (ref 11.6–15.4)
WBC: 9 10*3/uL (ref 3.4–10.8)

## 2021-10-23 LAB — BASIC METABOLIC PANEL
BUN/Creatinine Ratio: 24 (ref 10–24)
BUN: 31 mg/dL — ABNORMAL HIGH (ref 8–27)
CO2: 20 mmol/L (ref 20–29)
Calcium: 8.2 mg/dL — ABNORMAL LOW (ref 8.6–10.2)
Chloride: 102 mmol/L (ref 96–106)
Creatinine, Ser: 1.29 mg/dL — ABNORMAL HIGH (ref 0.76–1.27)
Glucose: 121 mg/dL — ABNORMAL HIGH (ref 70–99)
Potassium: 5.3 mmol/L — ABNORMAL HIGH (ref 3.5–5.2)
Sodium: 134 mmol/L (ref 134–144)
eGFR: 54 mL/min/{1.73_m2} — ABNORMAL LOW (ref >59)

## 2021-10-23 NOTE — Patient Instructions (Signed)
Medication Instructions:  Your physician recommends that you continue on your current medications as directed. Please refer to the Current Medication list given to you today.  *If you need a refill on your cardiac medications before your next appointment, please call your pharmacy*   Lab Work: TODAY:  BMET & CBC  If you have labs (blood work) drawn today and your tests are completely normal, you will receive your results only by: [place] (if you have MyChart) OR A paper copy in the mail If you have any lab test that is abnormal or we need to change your treatment, we will call you to review the results.   Testing/Procedures: Your physician has recommended that you have a Cardioversion (DCCV). Electrical Cardioversion uses a jolt of electricity to your heart either through paddles or wired patches attached to your chest. This is a controlled, usually prescheduled, procedure. Defibrillation is done under light anesthesia in the hospital, and you usually go home the day of the procedure. This is done to get your heart back into a normal rhythm. You are not awake for the procedure. Please see the instruction sheet BELOW:  Dear [name] are scheduled for a Cardioversion on 11/08/21 with Dr. Debara Pickett.  Please arrive at the [facility] (Main Entrance A) at [facility]: [address] [place], [place] [address] at 8:30 am  DIET: Nothing to eat or drink after midnight except a sip of water with medications (see medication instructions below)  FYI: For your safety, and to allow [place] to monitor your vital signs accurately during the surgery/procedure we request that   if you have artificial nails, gel coating, SNS etc. Please have those removed prior to your surgery/procedure. Not having the nail coverings /polish removed may result in cancellation or delay of your surgery/procedure.   Medication Instructions: Hold LASIX AND METOLAZONE ON THE DAY OF YOUR PROCEDURE  Continue your  anticoagulant: PRADAXA & ASPIRIN  You will need to continue your anticoagulant after your procedure until you  are told by your  Provider that it is safe to stop   Labs:   Come to: 11/02/21 Come to the lab at [place] between the hours of 7:15 am and 4:30 pm. You do not have to be fasting  You must have a responsible person to drive you home and stay in the waiting area during your procedure. Failure to do so could result in cancellation.  Bring your insurance cards.  *Special Note: Every effort is made to have your procedure done on time. Occasionally there are emergencies that occur at the hospital that may cause delays. Please be patient if a delay does occur.      Follow-Up: At [facility], you and your health needs are our priority.  As part of our continuing mission to provide you with exceptional heart care, we have created designated Provider Care Teams.  These Care Teams include your primary Cardiologist (physician) and Advanced Practice Providers (APPs -  Physician Assistants and Nurse Practitioners) who all work together to provide you with the care you need, when you need it.  We recommend signing up for the patient portal called "MyChart".  Sign up information is provided on this After Visit Summary.  MyChart is used to connect with patients for Virtual Visits (Telemedicine).  Patients are able to view lab/test results, encounter notes, upcoming appointments, etc.  Non-urgent messages can be sent to your provider as well.   To learn more about what  you can do with MyChart, go to [email or web address].    Your next appointment:   6 month(s)  The format for your next appointment:   In Person  Provider:   [name], MD     Other Instructions

## 2021-10-24 ENCOUNTER — Other Ambulatory Visit: Payer: Self-pay

## 2021-10-24 MED ORDER — METOLAZONE 2.5 MG PO TABS: 2.5000 mg | ORAL_TABLET | ORAL | 3 refills | Status: DC

## 2021-10-24 MED ORDER — POTASSIUM CHLORIDE CRYS ER 20 MEQ PO TBCR: 20.0000 meq | EXTENDED_RELEASE_TABLET | Freq: Every day | ORAL | 3 refills | Status: DC

## 2021-10-30 DIAGNOSIS — H353221 Exudative age-related macular degeneration, left eye, with active choroidal neovascularization: Secondary | ICD-10-CM | POA: Diagnosis not present

## 2021-10-30 MED ORDER — COLCHICINE 0.6 MG PO TABS: 0.6000 mg | ORAL_TABLET | Freq: Four times a day (QID) | ORAL | 5 refills | Status: DC | PRN

## 2021-10-30 NOTE — Telephone Encounter (Signed)
Done

## 2021-10-31 ENCOUNTER — Encounter (HOSPITAL_COMMUNITY): Payer: Self-pay | Admitting: Internal Medicine

## 2021-10-31 NOTE — Progress Notes (Signed)
Attempted to obtain medical history via telephone, unable to reach at this time. I left a voicemail to return pre surgical testing department's phone call.  

## 2021-11-01 ENCOUNTER — Telehealth: Payer: Self-pay | Admitting: Pharmacist

## 2021-11-01 NOTE — Chronic Care Management (AMB) (Signed)
? ? ?Chronic Care Management ?Pharmacy Assistant  ? ?Name: Justin [name]  MRN: [id number] DOB: 10-11-1933 ? ?Reason for Encounter: Disease State / Hypertension Assessment Call ?  ?Conditions to be addressed/monitored: ?HTN ? ? ?Recent office visits:  ?09/25/2021 [name] MD - Patient was seen for viral URI with cough. Started Methylprednisolone 4 mg. Discontinued Zpac. No follow up noted.  ? ?09/17/2021 [name] MD - Patient was seen for bronchitis. Started Zpac and Hycodan. Follow up if symptoms worsen or fail to improve. ? ?Recent consult visits:  ?10/23/2021 Ermalinda Barrios PA-C (cardiology) - Patient was seen for Paroxysmal atrial fibrillation and additional issues. Discontinued Flonase, methylprednisolone and Klyte. No follow up noted.  ? ?09/04/2021 Larey Days MD (pulmonary) - Patient was seen for chronic cough and an additional issue. Started Flonase. Follow up in 1 year. ? ?08/23/2021 Virl Axe MD (cardiology) - Patient was seen for Chronic systolic heart failure and additional issues. Started Metolazone 2.5 mg twice weekly. Follow up in 1 year.  ? ?Hospital visits:  ?None ? ?Medications: ?Outpatient Encounter Medications as of 11/01/2021  ?Medication Sig  ? allopurinol (ZYLOPRIM) 100 MG tablet Take 1 tablet (100 mg total) by mouth daily.  ? aspirin 81 MG tablet Take 1 tablet (81 mg total) by mouth daily.  ? atorvastatin (LIPITOR) 20 MG tablet TAKE ONE TABLET ONCE DAILY  ? Azelastine HCl 0.15 % SOLN Place 2 sprays into the nose in the morning and at bedtime.  ? benzonatate (TESSALON) 200 MG capsule Take 1 capsule (200 mg total) by mouth 2 (two) times daily as needed for cough.  ? colchicine 0.6 MG tablet Take 1 tablet (0.6 mg total) by mouth every 6 (six) hours as needed (gout).  ? dabigatran (PRADAXA) 150 MG CAPS capsule TAKE (1) CAPSULE TWICE DAILY.  ? diphenhydrAMINE (BENADRYL) 25 MG tablet Take 25 mg by mouth at bedtime.   ? ENTRESTO 97-103 MG TAKE 1 TABLET BY MOUTH TWICE DAILY.  ?  EPINEPHrine 0.3 mg/0.3 mL IJ SOAJ injection Inject 0.3 mLs (0.3 mg total) into the muscle as needed for anaphylaxis.  ? famotidine (PEPCID) 20 MG tablet TAKE ONE TABLET BY MOUTH AT BEDTIME.  ? fluorouracil (EFUDEX) 5 % cream Apply 1 application topically 2 (two) times daily as needed (rash).  ? furosemide (LASIX) 80 MG tablet Take 1 tablet (80 mg total) by mouth daily.  ? HYDROcodone bit-homatropine (HYCODAN) 5-1.5 MG/5ML syrup Take 5 mLs by mouth every 4 (four) hours as needed for cough.  ? hydrOXYzine (ATARAX/VISTARIL) 10 MG tablet Take 10 mg by mouth at bedtime.  ? isosorbide mononitrate (IMDUR) 30 MG 24 hr tablet TAKE ONE TABLET ONCE DAILY  ? ketotifen (ZADITOR) 0.025 % ophthalmic solution Apply 1 drop to eye daily as needed (itchy eyes).   ? levocetirizine (XYZAL) 5 MG tablet Take 5 mg by mouth daily.  ? levothyroxine (SYNTHROID) 100 MCG tablet Take 1 tablet (100 mcg total) by mouth daily.  ? meclizine (ANTIVERT) 25 MG tablet Take 1 tablet (25 mg total) by mouth every 4 (four) hours as needed for dizziness.  ? methocarbamol (ROBAXIN) 500 MG tablet Take 1 tablet (500 mg total) by mouth every 8 (eight) hours as needed for muscle spasms.  ? metoCLOPramide (REGLAN) 10 MG tablet TAKE ONE TABLET BY MOUTH EVERY MORNING WITH BREAKFAST  ? metolazone (ZAROXOLYN) 2.5 MG tablet Take 1 tablet (2.5 mg total) by mouth once a week.  ? metoprolol succinate (TOPROL-XL) 50 MG 24 hr tablet Take 1.5 tablets (75  mg total) by mouth daily. Take with or immediately following a meal.  ? montelukast (SINGULAIR) 10 MG tablet TAKE 1 TABLET ONCE DAILY IN THE EVENING.  ? Multiple Vitamins-Minerals (PRESERVISION AREDS PO) Take 2 tablets by mouth every morning.  ? omeprazole (PRILOSEC) 20 MG capsule TAKE (1) CAPSULE TWICE DAILY.  ? oxyCODONE (OXY IR/ROXICODONE) 5 MG immediate release tablet Take 1 tablet (5 mg total) by mouth every 12 (twelve) hours as needed for severe pain.  ? potassium chloride SA (KLOR-CON M) 20 MEQ tablet Take 1 tablet  (20 mEq total) by mouth daily.  ? spironolactone (ALDACTONE) 25 MG tablet TAKE ONE TABLET BY MOUTH EVERY DAY.  ? triamcinolone cream (KENALOG) 0.1 % Apply 1 application topically daily as needed for dry skin.  ? valACYclovir (VALTREX) 500 MG tablet Take 500 mg by mouth 2 (two) times daily as needed (cold sores).  ? ?No facility-administered encounter medications on file as of 11/01/2021.  ?Fill History: ?atorvastatin 20 mg tablet 10/22/2021 90  ? ?allopurinol 100 mg tablet 08/29/2021 90  ? ?azelastine 137 mcg (0.1 %) nasal spray aerosol 04/03/2021 75  ? ?Pradaxa 150 mg capsule 09/27/2021 30  ? ?famotidine 20 mg tablet 10/22/2021 90  ? ?fluticasone propionate 50 mcg/actuation nasal spray,suspension 10/20/2021 30  ? ?isosorbide mononitrate ER 30 mg tablet,extended release 24 hr 08/08/2021 90  ? ?levothyroxine 100 mcg tablet 07/10/2021 90  ? ?metoclopramide 10 mg tablet 09/27/2021 30  ? ?metoprolol succinate ER 50 mg tablet,extended release 24 hr 07/24/2021 90  ? ?montelukast 10 mg tablet 08/31/2021 90  ? ?omeprazole 20 mg capsule,delayed release 08/07/2021 90  ? ?Entresto 97 mg-103 mg tablet 09/20/2021 30  ? ?spironolactone 25 mg tablet 07/24/2021 90  ? ?Reviewed chart prior to disease state call. Spoke with patient regarding BP ? ?Recent Office Vitals: ?BP Readings from Last 3 Encounters:  ?10/23/21 (!) 90/56  ?09/25/21 (!) 80/48  ?09/11/21 112/62  ? ?Pulse Readings from Last 3 Encounters:  ?10/23/21 94  ?09/25/21 92  ?09/04/21 (!) 101  ?  ?Wt Readings from Last 3 Encounters:  ?10/23/21 268 lb 9.6 oz (121.8 kg)  ?09/25/21 265 lb (120.2 kg)  ?09/04/21 271 lb (122.9 kg)  ?  ? ?Kidney Function ?Lab Results  ?Component Value Date/Time  ? CREATININE 1.29 (H) 10/23/2021 10:48 AM  ? CREATININE 1.33 (H) 09/06/2021 10:14 AM  ? CREATININE 1.27 (H) 03/21/2020 09:43 AM  ? CREATININE 0.74 08/14/2016 01:11 PM  ? GFR 53.29 (L) 03/28/2021 07:30 AM  ? GFRNONAA >60 04/19/2021 03:05 PM  ? GFRAA 66 04/04/2020 11:32 AM  ? ? ?BMP Latest Ref  Rng & Units 10/23/2021 09/06/2021 07/09/2021  ?Glucose 70 - 99 mg/dL 121(H) 114(H) 86  ?BUN 8 - 27 mg/dL 31(H) 31(H) 22  ?Creatinine 0.76 - 1.27 mg/dL 1.29(H) 1.33(H) 1.08  ?BUN/Creat Ratio 10 - '24 24 23 20  '$ ?Sodium 134 - 144 mmol/L 134 136 137  ?Potassium 3.5 - 5.2 mmol/L 5.3(H) 4.7 4.7  ?Chloride 96 - 106 mmol/L 102 101 101  ?CO2 20 - 29 mmol/L '20 21 23  '$ ?Calcium 8.6 - 10.2 mg/dL 8.2(L) 8.4(L) 8.4(L)  ? ? ?Current antihypertensive regimen:  ?Entresto 97/103 1 tablet twice daily ?Imdur 30 mg daily ?Metoprolol 50 mg 1.5 tablets daily ?Furosemide 80 mg daily ?Spironolactone 25 mg daily ? ?How often are you checking your Blood Pressure? Patient is not checking at home, he has been going to cardiology pretty regular and his readings have been good. ? ?Current home BP readings: Patient  states his blood pressures have been around 120/60 at the cardiology office.  ? ?What recent interventions/DTPs have been made by any provider to improve Blood Pressure control since last CPP Visit: No recent interventions ? ?Any recent hospitalizations or ED visits since last visit with CPP? No recent hospital visits.  ? ?What diet changes have been made to improve Blood Pressure Control?  ?Patient eating smaller amounts ?Breakfast - patient will have boiled egg and toast or cereal ?Lunch - patient will have glass of V8 or crackers with cheese ?Dinner - patient will have a meat and vegetable, he will usually have slaw and a tomato ?Snack - patient will have berries ? ?What exercise is being done to improve your Blood Pressure Control?  ?Patient isn't doing anything specific, walking around the house and he does all the cooking.  ? ?Adherence Review: ?Is the patient currently on ACE/ARB medication? No ?Does the patient have >5 day gap between last estimated fill dates? No ? ?Care Gaps: ?AWV - completed on 03/22/21 ?Last BP - 90/56 on 10/23/2021 ?Last A1C - 6.6 on 03/28/2021 ?Covid booster - overdue ?  ?Star Rating Drug: ?Atorvastatin '20mg'$  -  last filled on 10/22/2021 90DS at [facility] ? ? ?[place]  ?Clinical Pharmacist Assistant ?[phone] ? ?

## 2021-11-02 ENCOUNTER — Other Ambulatory Visit: Payer: Self-pay

## 2021-11-02 ENCOUNTER — Other Ambulatory Visit: Payer: Medicare Other | Admitting: *Deleted

## 2021-11-02 DIAGNOSIS — Z95810 Presence of automatic (implantable) cardiac defibrillator: Secondary | ICD-10-CM

## 2021-11-02 DIAGNOSIS — E7849 Other hyperlipidemia: Secondary | ICD-10-CM

## 2021-11-02 DIAGNOSIS — I10 Essential (primary) hypertension: Secondary | ICD-10-CM | POA: Diagnosis not present

## 2021-11-02 DIAGNOSIS — I480 Paroxysmal atrial fibrillation: Secondary | ICD-10-CM

## 2021-11-02 DIAGNOSIS — I2510 Atherosclerotic heart disease of native coronary artery without angina pectoris: Secondary | ICD-10-CM

## 2021-11-02 DIAGNOSIS — I255 Ischemic cardiomyopathy: Secondary | ICD-10-CM

## 2021-11-02 DIAGNOSIS — Z952 Presence of prosthetic heart valve: Secondary | ICD-10-CM

## 2021-11-02 LAB — BASIC METABOLIC PANEL
BUN/Creatinine Ratio: 30 — ABNORMAL HIGH (ref 10–24)
BUN: 39 mg/dL — ABNORMAL HIGH (ref 8–27)
CO2: 19 mmol/L — ABNORMAL LOW (ref 20–29)
Calcium: 7.9 mg/dL — ABNORMAL LOW (ref 8.6–10.2)
Chloride: 104 mmol/L (ref 96–106)
Creatinine, Ser: 1.3 mg/dL — ABNORMAL HIGH (ref 0.76–1.27)
Glucose: 116 mg/dL — ABNORMAL HIGH (ref 70–99)
Potassium: 4.7 mmol/L (ref 3.5–5.2)
Sodium: 136 mmol/L (ref 134–144)
eGFR: 54 mL/min/{1.73_m2} — ABNORMAL LOW (ref >59)

## 2021-11-02 LAB — CBC
Hematocrit: 38.8 % (ref 37.5–51.0)
Hemoglobin: 12.6 g/dL — ABNORMAL LOW (ref 13.0–17.7)
MCH: 27.8 pg (ref 26.6–33.0)
MCHC: 32.5 g/dL (ref 31.5–35.7)
MCV: 86 fL (ref 79–97)
Platelets: 265 10*3/uL (ref 150–450)
RBC: 4.53 x10E6/uL (ref 4.14–5.80)
RDW: 15 % (ref 11.6–15.4)
WBC: 10.6 10*3/uL (ref 3.4–10.8)

## 2021-11-08 ENCOUNTER — Encounter (HOSPITAL_COMMUNITY): Payer: Self-pay | Admitting: Internal Medicine

## 2021-11-08 ENCOUNTER — Ambulatory Visit (HOSPITAL_COMMUNITY): Payer: Medicare Other | Admitting: Anesthesiology

## 2021-11-08 ENCOUNTER — Encounter (HOSPITAL_COMMUNITY)
Admission: RE | Disposition: A | Discharge status: Home / Self Care | Payer: Self-pay | Source: Home / Self Care | Attending: Internal Medicine

## 2021-11-08 ENCOUNTER — Ambulatory Visit (HOSPITAL_BASED_OUTPATIENT_CLINIC_OR_DEPARTMENT_OTHER): Payer: Medicare Other | Admitting: Anesthesiology

## 2021-11-08 ENCOUNTER — Ambulatory Visit (HOSPITAL_COMMUNITY)
Admission: RE | Admit: 2021-11-08 | Discharge: 2021-11-08 | Disposition: A | Discharge status: Home / Self Care | Payer: Medicare Other | Attending: Internal Medicine | Admitting: Internal Medicine

## 2021-11-08 DIAGNOSIS — Z6841 Body Mass Index (BMI) 40.0 and over, adult: Secondary | ICD-10-CM | POA: Insufficient documentation

## 2021-11-08 DIAGNOSIS — I2510 Atherosclerotic heart disease of native coronary artery without angina pectoris: Secondary | ICD-10-CM | POA: Diagnosis not present

## 2021-11-08 DIAGNOSIS — J449 Chronic obstructive pulmonary disease, unspecified: Secondary | ICD-10-CM | POA: Diagnosis not present

## 2021-11-08 DIAGNOSIS — E039 Hypothyroidism, unspecified: Secondary | ICD-10-CM | POA: Diagnosis not present

## 2021-11-08 DIAGNOSIS — I4891 Unspecified atrial fibrillation: Secondary | ICD-10-CM | POA: Diagnosis not present

## 2021-11-08 DIAGNOSIS — I110 Hypertensive heart disease with heart failure: Secondary | ICD-10-CM | POA: Diagnosis not present

## 2021-11-08 DIAGNOSIS — I252 Old myocardial infarction: Secondary | ICD-10-CM | POA: Diagnosis not present

## 2021-11-08 DIAGNOSIS — Z79899 Other long term (current) drug therapy: Secondary | ICD-10-CM | POA: Insufficient documentation

## 2021-11-08 DIAGNOSIS — E785 Hyperlipidemia, unspecified: Secondary | ICD-10-CM | POA: Insufficient documentation

## 2021-11-08 DIAGNOSIS — I509 Heart failure, unspecified: Secondary | ICD-10-CM | POA: Diagnosis not present

## 2021-11-08 DIAGNOSIS — I5042 Chronic combined systolic (congestive) and diastolic (congestive) heart failure: Secondary | ICD-10-CM | POA: Insufficient documentation

## 2021-11-08 DIAGNOSIS — Z951 Presence of aortocoronary bypass graft: Secondary | ICD-10-CM | POA: Diagnosis not present

## 2021-11-08 DIAGNOSIS — Z950 Presence of cardiac pacemaker: Secondary | ICD-10-CM | POA: Insufficient documentation

## 2021-11-08 DIAGNOSIS — I480 Paroxysmal atrial fibrillation: Secondary | ICD-10-CM

## 2021-11-08 DIAGNOSIS — K219 Gastro-esophageal reflux disease without esophagitis: Secondary | ICD-10-CM | POA: Diagnosis not present

## 2021-11-08 DIAGNOSIS — Z87891 Personal history of nicotine dependence: Secondary | ICD-10-CM | POA: Diagnosis not present

## 2021-11-08 HISTORY — PX: CARDIOVERSION: SHX1299

## 2021-11-08 LAB — POCT I-STAT, CHEM 8
BUN: 33 mg/dL — ABNORMAL HIGH (ref 8–23)
Calcium, Ion: 1.12 mmol/L — ABNORMAL LOW (ref 1.15–1.40)
Chloride: 104 mmol/L (ref 98–111)
Creatinine, Ser: 1.3 mg/dL — ABNORMAL HIGH (ref 0.61–1.24)
Glucose, Bld: 102 mg/dL — ABNORMAL HIGH (ref 70–99)
HCT: 43 % (ref 39.0–52.0)
Hemoglobin: 14.6 g/dL (ref 13.0–17.0)
Potassium: 4.3 mmol/L (ref 3.5–5.1)
Sodium: 138 mmol/L (ref 135–145)
TCO2: 25 mmol/L (ref 22–32)

## 2021-11-08 SURGERY — CARDIOVERSION
Anesthesia: General

## 2021-11-08 MED ORDER — LIDOCAINE 2% (20 MG/ML) 5 ML SYRINGE
INTRAMUSCULAR | Status: DC | PRN
  Administered 2021-11-08: 40 mg via INTRAVENOUS

## 2021-11-08 MED ORDER — PROPOFOL 10 MG/ML IV BOLUS
INTRAVENOUS | Status: DC | PRN
  Administered 2021-11-08: 30 mg via INTRAVENOUS

## 2021-11-08 MED ORDER — ETOMIDATE 2 MG/ML IV SOLN
INTRAVENOUS | Status: DC | PRN
  Administered 2021-11-08: 20 mg via INTRAVENOUS

## 2021-11-08 MED ORDER — SODIUM CHLORIDE 0.9 % IV SOLN: INTRAVENOUS | Status: DC

## 2021-11-08 NOTE — CV Procedure (Addendum)
? ?  CARDIOVERSION NOTE ? ?Procedure: Electrical Cardioversion ?Indications:  Atrial Fibrillation ? ?Procedure Details: ? ?Consent: Risks of procedure as well as the alternatives and risks of each were explained to the (patient/caregiver).  Consent for procedure obtained. ? ?Time Out: Verified patient identification, verified procedure, site/side was marked, verified correct patient position, special equipment/implants available, medications/allergies/relevent history reviewed, required imaging and test results available.  Performed ? ?Patient placed on cardiac monitor, pulse oximetry, supplemental oxygen as necessary.  ?Sedation given: Etomidate per anesthesia ?Pacer pads placed anterior and posterior chest. ? ?Cardioverted 3 time(s).  ?Cardioverted at 150J and 200J x 2 biphasic ? ?Impression: ?Findings: Post procedure EKG shows:  bi-v paced rhythm  - device remotely interrogated by medtronic ?Complications: None ?Patient did tolerate procedure well. ? ?Plan: ?Successful DCCV with 3 stacked shocks to BI-V paced rhythm ? ?Time Spent Directly with the Patient: ? ?30 minutes  ? ?Pixie Casino, MD, [facility], FACP  ?[place]  ?Medical Director of the [facility] &  ?Card[facility] ?Diplomate of the [employer] of Clinical Lipidology ?Attending Cardiologist  ?Direct Dial: [phone]  Fax: [phone]  ?Website:  [email or web address] ? ?Justin [name] ?11/08/2021, 9:21 AM ? ? ? ? ?

## 2021-11-08 NOTE — Transfer of Care (Signed)
Immediate Anesthesia Transfer of Care Note ? ?Patient: Justin [name] ? ?Procedure(s) Performed: CARDIOVERSION ? ?Patient Location: Endoscopy Unit ? ?Anesthesia Type:General ? ?Level of Consciousness: awake and alert  ? ?Airway & Oxygen Therapy: Patient Spontanous Breathing ? ?Post-op Assessment: Report given to RN and Post -op Vital signs reviewed and stable ? ?Post vital signs: Reviewed and stable ? ?Last Vitals:  ?Vitals Value Taken Time  ?BP 102/61   ?Temp    ?Pulse 62   ?Resp 22   ?SpO2 100   ? ? ?Last Pain:  ?Vitals:  ? 11/08/21 0850  ?TempSrc: Temporal  ?PainSc: 0-No pain  ?   ? ?  ? ?Complications: No notable events documented. ?

## 2021-11-08 NOTE — Anesthesia Postprocedure Evaluation (Signed)
Anesthesia Post Note ? ?Patient: Justin [name] ? ?Procedure(s) Performed: CARDIOVERSION ? ?  ? ?Patient location during evaluation: Endoscopy ?Anesthesia Type: General ?Level of consciousness: awake and alert, oriented and patient cooperative ?Pain management: pain level controlled ?Vital Signs Assessment: post-procedure vital signs reviewed and stable ?Respiratory status: spontaneous breathing, nonlabored ventilation and respiratory function stable ?Cardiovascular status: blood pressure returned to baseline and stable ?Postop Assessment: no apparent nausea or vomiting ?Anesthetic complications: no ? ? ?No notable events documented. ? ?Last Vitals:  ?Vitals:  ? 11/08/21 0850  ?BP: (!) 99/53  ?Pulse: 90  ?Resp: 16  ?Temp: (!) 36.3 ?C  ?SpO2: 100%  ?  ?Last Pain:  ?Vitals:  ? 11/08/21 0850  ?TempSrc: Temporal  ?PainSc: 0-No pain  ? ? ?  ?  ?  ?  ?  ?  ? ?Saina Waage,E. Analleli Gierke ? ? ? ? ?

## 2021-11-08 NOTE — Discharge Instructions (Signed)

## 2021-11-08 NOTE — Anesthesia Preprocedure Evaluation (Addendum)
Anesthesia Evaluation  ?Patient identified by MRN, date of birth, ID band ?Patient awake ? ? ? ?Reviewed: ?Allergy & Precautions, NPO status , Patient's Chart, lab work & pertinent test results, reviewed documented beta blocker date and time  ? ?History of Anesthesia Complications ?Negative for: history of anesthetic complications ? ?Airway ?Mallampati: II ? ?TM Distance: >3 FB ?Neck ROM: Full ? ? ? Dental ? ?(+) Dental Advisory Given ?  ?Pulmonary ?COPD, former smoker,  ?  ?breath sounds clear to auscultation ? ? ? ? ? ? Cardiovascular ?hypertension, Pt. on home beta blockers and Pt. on medications ?(-) angina+ CAD, + CABG and +CHF (Entresto)  ?+ dysrhythmias Atrial Fibrillation + pacemaker (BiV) + Cardiac Defibrillator ?+ Valvular Problems/Murmurs (s/p TAVR)  ?Rhythm:Regular Rate:Normal ? ?08/2021 ECHO: EF 40-45%, mildly decreased LVF, mild concentric LVH, normal RVF, severe L atrial dilation, s/p TAVR ?  ?Neuro/Psych ?negative neurological ROS ?   ? GI/Hepatic ?Neg liver ROS, GERD  Medicated and Controlled,  ?Endo/Other  ?Hypothyroidism Morbid obesity ? Renal/GU ?Renal InsufficiencyRenal disease  ? ?  ?Musculoskeletal ? ?(+) Arthritis ,  ? Abdominal ?(+) + obese,   ?Peds ? Hematology ?Pradaxa   ?Anesthesia Other Findings ? ? Reproductive/Obstetrics ? ?  ? ? ? ? ? ? ? ? ? ? ? ? ? ?  ?  ? ? ? ? ? ? ?Anesthesia Physical ?Anesthesia Plan ? ?ASA: 4 ? ?Anesthesia Plan: General  ? ?Post-op Pain Management: Minimal or no pain anticipated  ? ?Induction:  ? ?PONV Risk Score and Plan: 2 and Treatment may vary due to age or medical condition ? ?Airway Management Planned: Natural Airway and Mask ? ?Additional Equipment: None ? ?Intra-op Plan:  ? ?Post-operative Plan:  ? ?Informed Consent: I have reviewed the patients History and Physical, chart, labs and discussed the procedure including the risks, benefits and alternatives for the proposed anesthesia with the patient or authorized  representative who has indicated his/her understanding and acceptance.  ? ? ? ?Dental advisory given ? ?Plan Discussed with: CRNA and Surgeon ? ?Anesthesia Plan Comments:   ? ? ? ? ? ?Anesthesia Quick Evaluation ? ?

## 2021-11-08 NOTE — Anesthesia Procedure Notes (Signed)
Procedure Name: General with mask airway ?Date/Time: 11/08/2021 9:12 AM ?Performed by: Wilburn Cornelia, CRNA ?Pre-anesthesia Checklist: Patient identified, Timeout performed, Patient being monitored, Suction available and Emergency Drugs available ?Patient Re-evaluated:Patient Re-evaluated prior to induction ?Oxygen Delivery Method: Ambu bag ?Induction Type: IV induction ?Ventilation: Mask ventilation without difficulty ?Placement Confirmation: breath sounds checked- equal and bilateral ?Dental Injury: Teeth and Oropharynx as per pre-operative assessment  ? ? ? ? ?

## 2021-11-08 NOTE — Interval H&P Note (Signed)
History and Physical Interval Note: ? ?11/08/2021 ?8:45 AM ? ?Justin [name]  has presented today for surgery, with the diagnosis of AFIB.  The various methods of treatment have been discussed with the patient and family. After consideration of risks, benefits and other options for treatment, the patient has consented to  Procedure(s): ?CARDIOVERSION (N/A) as a surgical intervention.  The patient's history has been reviewed, patient examined, no change in status, stable for surgery.  I have reviewed the patient's chart and labs.  Questions were answered to the patient's satisfaction.   ? ? ?Justin [name] ? ? ?

## 2021-11-09 ENCOUNTER — Encounter (HOSPITAL_COMMUNITY): Payer: Self-pay | Admitting: Internal Medicine

## 2021-11-12 ENCOUNTER — Other Ambulatory Visit: Payer: Self-pay | Admitting: Family Medicine

## 2021-11-12 ENCOUNTER — Ambulatory Visit (INDEPENDENT_AMBULATORY_CARE_PROVIDER_SITE_OTHER): Payer: Medicare Other

## 2021-11-12 ENCOUNTER — Telehealth: Payer: Self-pay

## 2021-11-12 DIAGNOSIS — I5022 Chronic systolic (congestive) heart failure: Secondary | ICD-10-CM | POA: Diagnosis not present

## 2021-11-12 DIAGNOSIS — Z95810 Presence of automatic (implantable) cardiac defibrillator: Secondary | ICD-10-CM | POA: Diagnosis not present

## 2021-11-12 NOTE — Progress Notes (Signed)
Received: Today ?Imogene Burn, PA-C  Pritesh Sobecki Panda, RN ?Agree with plan, have him take metolazone 2.5 mg once weekly starting tomorrow and see how he does. thanks  ?

## 2021-11-12 NOTE — Progress Notes (Signed)
Spoke with patient and advised Ermalinda Barrios, NP agreed with plan to take Metolazone 2.5 mg 1 tablet weekly.  He verbalized unduerstanding and will take a dose tomorrow morning.  ?

## 2021-11-12 NOTE — Progress Notes (Signed)
EPIC Encounter for ICM Monitoring ? ?Patient Name: Justin [name] is a 88 y.o. male ?Date: 11/12/2021 ?Primary Care Physican: Laurey Morale, MD ?Primary Cardiologist: Angelena Form ?Electrophysiologist: [name] ?Bi-V Pacing: 87% Effective 86.4% (% of Time Since 08-Nov-2021) ?10/11/2021 Weight: 262 lbs ?11/12/2021 Weight: 262-263 lbs ?  ?                                                      ?  ?Spoke with patient and heart failure questions reviewed.  He reports swelling of ankles since he stopped Metolazone 2 weeks ago. Weight is stable.   He misunderstood instructions and thought he was to stop Metolazone instead of taking it weekly as prescribed on 3/1.  Cardioversion completed 3/16  ? ?Optivol thoracic impedance suggesting possible fluid accumulation starting 3/6 which correlates with patient stopping Metolazone.   ?  ?Prescribed: ?Furosemide 80 mg take 1 tablet every day.  ?Metolazone 2.5 mg Take 1 tablet by mouth once week. ?KLC 20 mEq Take 1 tablet (20 mEq total) by mouth daily.  ?Spironolactone 25 mg take 1 tablet daily ?  ?Labs: ?11/08/2021 Creatinine 1.30, BUN 33, Potassium 4.3, Sodium 138 ?11/02/2021 Creatinine 1.30, BUN 39, Potassium 4.7, Sodium 136, GFR 54  ?10/23/2021 Creatinine 1.29, BUN 31, Potassium 5.3, Sodium 134, GFR 54  ?09/06/2021 Creatinine 1.33, BUN 31, Potassium 4.7, Sodium 136, GFR 52 ?07/09/2021 Creatinine 1.08, BUN 22, Potassium 4.7, Sodium 137, GFR 67 ?06/28/2021 Creatinine 1.39, BUN 32, Potassium 5.0, Sodium 139, GFR 50  ?A complete set of results can be found in Results Review. ?  ?Recommendations:  Advised to follow prescribed dosage of Metolazone which is once a week.  He will take Metolazone tomorrow, 3/21.   ?  ?Follow-up plan: ICM clinic phone appointment on 11/20/2021 to recheck fluid levels.   91 day device clinic remote transmission 01/07/2022.   ?  ?EP/Cardiology Office Visits:  11/21/2021 with with Ermalinda Barrios, NP.  Recall 08/23/2022 with Dr [name]. ?  ?Copy of ICM check sent to Dr.  [name] and Ermalinda Barrios NP for review and any further recommendations if needed.   ? ?3 month ICM trend: 11/12/2021. ? ? ? ?12-14 Month ICM trend:  ? ? ? ?[name], RN ?11/12/2021 ?2:35 PM ? ?

## 2021-11-12 NOTE — Telephone Encounter (Signed)
Spoke with patient and requested to send remote transmission.  He will send one today.   ?

## 2021-11-16 NOTE — Progress Notes (Signed)
? ?Cardiology Office Note   ? ?Date:  11/21/2021  ? ?ID:  Justin [name], DOB 18-Jun-1934, MRN [id number] ? ? ?PCP:  Laurey Morale, MD ?  ?[place]  ?Cardiologist:  [name], MD   ?Advanced Practice Provider:  No care team member to display ?Electrophysiologist:  Virl Axe, MD  ? ?[id number]}  ? ?No chief complaint on file. ? ? ?History of Present Illness:  ?Justin [name] is a 88 y.o. male  with a hx of hypertension, hyperlipidemia, severe aortic stenosis status post TAVR, paroxysmal atrial fibrillation, chronic diastolic and systolic CHF, coronary artery disease status post CABG x4, syncope, junctional bradycardia, PVCs, nonsustained VT, LBBB, and ICD implantation. ?  ?He underwent cardiac catheterization 1/18 which showed all 4 of his grafts to be patent.  CRT ICD 9/18 implanted because of syncope  and new left bundle branch block following TAVR 02/2017 ; hx of Afib on dabigitran and ASA;  recurrent Afib with DCCV 8/22    ?  ?Patient saw Dr. [name] 08/23/21 and was fluid overloaded and increased metolazone 2.5 mg twice a week and and ordered echo to help guide antiarrhythmic therapy for VT. Echo 09/11/21 improved LVEF 40-45%. ?  ?OptiVol thoracic impedance 10/09/2021 suggest possible dehydration starting 09/25/2021.  BiV pacing decreased from 70% effective to 64% and atrial tach A-fib burden increased from 5% to 99%.  He was advised to stay hydrated and drink 48 to 50 ounces daily. ? ?I saw patient 10/23/21 with chronic DOE unchanged but Dr. [name] recommend repeat DCCV which was performed 11/08/21(3 shocks). I also decreased metolazone 2.5 mg to once daily but patient stopped completely and then developed swelling. Asked to take weekly. ? ?Patient comes in for f/u. Increase edema, short of breath and optival increased yest. Weight up 10 lbs. Doesn't know if he's in afib or not. ? ?Past Medical History:  ?Diagnosis Date  ? Age-related macular degeneration, dry, both eyes   ?  Allergic   ? "24/7; 365 days/year; I'm allergic to pollens, dust, all southern grasses/trees, mold, mildue, cats, dogs" (01/07/2017)  ? Anal fissure   ? Asthma   ? sees [name]   ? Benign prostatic hypertrophy   ? (sees Dr. Denman George  ? CAD (coronary artery disease)   ? a. s/p CABG 2007. b. Cath 08/2016 - 4/4 patent grafts.  ? Carotid bruit   ? carotid u/s 10/10: 0.39% bilaterally  ? Chronic combined systolic and diastolic CHF (congestive heart failure) ([place])   ? Complication of anesthesia 1980s  ? "w/anal cyst OR, he gave me a saddle block then put a narcotic in spinal cord; had a severe reaction to that" (01/07/2017)  ? Congestive heart failure (CHF) ([place])   ? ED (erectile dysfunction)   ? Family history of adverse reaction to anesthesia   ? "daughter wakes up during OR" (01/07/2017)  ? GERD (gastroesophageal reflux disease)   ? Gout   ? HTN (hypertension)   ? Hx of colonic polyps   ? (sees Dr. Henrene Pastor)  ? Hyperlipidemia   ? Hypothyroidism   ? Moderate to severe aortic stenosis   ? a. s/p TAVR 02/2017.  ? Myocardial infarction ([place]) ~ 2000  ? Obesity   ? Osteoarthritis   ? "was in my knees, hands" (01/07/2017 )  ? PAF (paroxysmal atrial fibrillation) ([place])   ? a. documented post TAVR.  ? Precancerous skin lesion   ? (sees Dr. Allyson Sabal)  ? Prostate cancer ([place]) dx'd ~  2014  ? 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  ? 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  ? 29 mm Edwards Sapien 3 transcatheter heart valve placed via percutaneous right transfemoral approach  ? ? ?Past Surgical History:  ?Procedure Laterality Date  ? ANUS SURGERY    ? "opened it back up cause it wouldn't heal; wound up w/a fissure" (01/07/2017)  ? BIV ICD INSERTION CRT-D N/A 05/02/2017  ? Procedure: BIV ICD INSERTION CRT-D;  Surgeon: Deboraha Sprang, MD;  Location: [facility];  Service: Cardiovascular;  Laterality: N/A;  ? CARDIAC CATHETERIZATION  10/29/2005  ? CARDIAC CATHETERIZATION N/A 08/27/2016  ? Procedure: Right/Left Heart Cath and  Coronary/Graft Angiography;  Surgeon: Burnell Blanks, MD;  Location: [facility];  Service: Cardiovascular;  Laterality: N/A;  ? CARDIOVERSION N/A 04/24/2021  ? Procedure: CARDIOVERSION;  Surgeon: Fay Records, MD;  Location: [facility];  Service: Cardiovascular;  Laterality: N/A;  ? CARDIOVERSION N/A 11/08/2021  ? Procedure: CARDIOVERSION;  Surgeon: Pixie Casino, MD;  Location: [facility];  Service: Cardiovascular;  Laterality: N/A;  ? CATARACT EXTRACTION W/ INTRAOCULAR LENS  IMPLANT, BILATERAL Bilateral   ? CATARACT EXTRACTION, BILATERAL  2012  ? COLONOSCOPY  06/30/2008  ? no repeats needed   ? COLONOSCOPY    ? had 3 or 4 in the past   ? CORONARY ARTERY BYPASS GRAFT  2007  ? "CABG X4"  ? CYST EXCISION PERINEAL  1980s  ? HAMMER TOE SURGERY Bilateral   ? JOINT REPLACEMENT    ? KNEE ARTHROPLASTY  07/30/2011  ? Procedure: COMPUTER ASSISTED TOTAL KNEE ARTHROPLASTY;  Surgeon: Meredith Pel;  Location: [place];  Service: Orthopedics;  Laterality: Left;  left total knee arthroplasty  ? MASTECTOMY SUBCUTANEOUS Bilateral   ? MULTIPLE TOOTH EXTRACTIONS    ? ORIF FINGER / THUMB FRACTURE Right ~ 1980  ? "repair of thumb injury"  ? PROSTATE BIOPSY    ? REPLACEMENT TOTAL KNEE BILATERAL Bilateral 2012  ? TEE WITHOUT CARDIOVERSION N/A 03/04/2017  ? Procedure: TRANSESOPHAGEAL ECHOCARDIOGRAM (TEE);  Surgeon: Burnell Blanks, MD;  Location: [facility];  Service: Open Heart Surgery;  Laterality: N/A;  ? TOTAL KNEE REVISION Right 05/18/2019  ? Procedure: RIGHT PATELLA REVISION/REMOVAL;  Surgeon: Meredith Pel, MD;  Location: [place];  Service: Orthopedics;  Laterality: Right;  ? TRANSCATHETER AORTIC VALVE REPLACEMENT, TRANSFEMORAL N/A 03/04/2017  ? Procedure: TRANSCATHETER AORTIC VALVE REPLACEMENT, TRANSFEMORAL;  Surgeon: Burnell Blanks, MD;  Location: [place];  Service: Open Heart Surgery;  Laterality: N/A;  ? ? ?Current Medications: ?Current Meds  ?Medication Sig  ? allopurinol (ZYLOPRIM) 100 MG  tablet Take 1 tablet (100 mg total) by mouth daily.  ? aspirin 81 MG tablet Take 1 tablet (81 mg total) by mouth daily.  ? atorvastatin (LIPITOR) 20 MG tablet TAKE ONE TABLET ONCE DAILY  ? Azelastine HCl 0.15 % SOLN Place 2 sprays into the nose in the morning and at bedtime.  ? benzonatate (TESSALON) 200 MG capsule Take 1 capsule (200 mg total) by mouth 2 (two) times daily as needed for cough.  ? colchicine 0.6 MG tablet Take 1 tablet (0.6 mg total) by mouth every 6 (six) hours as needed (gout).  ? dabigatran (PRADAXA) 150 MG CAPS capsule TAKE (1) CAPSULE TWICE DAILY.  ? diphenhydrAMINE (BENADRYL) 25 MG tablet Take 25 mg by mouth at bedtime.   ? ENTRESTO 97-103 MG TAKE 1 TABLET BY MOUTH TWICE DAILY.  ? EPINEPHrine 0.3 mg/0.3 mL IJ  SOAJ injection Inject 0.3 mLs (0.3 mg total) into the muscle as needed for anaphylaxis.  ? famotidine (PEPCID) 20 MG tablet TAKE ONE TABLET BY MOUTH AT BEDTIME.  ? fluorouracil (EFUDEX) 5 % cream Apply 1 application topically 2 (two) times daily as needed (rash).  ? furosemide (LASIX) 80 MG tablet Take 1 tablet (80 mg total) by mouth daily.  ? HYDROcodone bit-homatropine (HYCODAN) 5-1.5 MG/5ML syrup Take 5 mLs by mouth every 4 (four) hours as needed for cough.  ? hydrOXYzine (ATARAX/VISTARIL) 10 MG tablet Take 10 mg by mouth at bedtime.  ? isosorbide mononitrate (IMDUR) 30 MG 24 hr tablet TAKE ONE TABLET ONCE DAILY  ? ketotifen (ZADITOR) 0.025 % ophthalmic solution Apply 1 drop to eye daily as needed (itchy eyes).   ? levocetirizine (XYZAL) 5 MG tablet Take 5 mg by mouth daily.  ? levothyroxine (SYNTHROID) 100 MCG tablet Take 1 tablet (100 mcg total) by mouth daily.  ? meclizine (ANTIVERT) 25 MG tablet Take 1 tablet (25 mg total) by mouth every 4 (four) hours as needed for dizziness.  ? methocarbamol (ROBAXIN) 500 MG tablet Take 1 tablet (500 mg total) by mouth every 8 (eight) hours as needed for muscle spasms.  ? metoCLOPramide (REGLAN) 10 MG tablet TAKE ONE TABLET BY MOUTH EVERY MORNING  WITH BREAKFAST  ? metoprolol succinate (TOPROL-XL) 50 MG 24 hr tablet Take 1.5 tablets (75 mg total) by mouth daily. Take with or immediately following a meal.  ? montelukast (SINGULAIR) 10 MG tablet TAKE 1 TABLET ONC

## 2021-11-20 ENCOUNTER — Ambulatory Visit (INDEPENDENT_AMBULATORY_CARE_PROVIDER_SITE_OTHER): Payer: Medicare Other

## 2021-11-20 DIAGNOSIS — I5022 Chronic systolic (congestive) heart failure: Secondary | ICD-10-CM

## 2021-11-20 DIAGNOSIS — Z95810 Presence of automatic (implantable) cardiac defibrillator: Secondary | ICD-10-CM

## 2021-11-20 NOTE — Progress Notes (Signed)
EPIC Encounter for ICM Monitoring ? ?Patient Name: Justin [name] is a 88 y.o. male ?Date: 11/20/2021 ?Primary Care Physican: Laurey Morale, MD ?Primary Cardiologist: Angelena Form ?Electrophysiologist: [name] ?Bi-V Pacing: 82.3% Effective 79.9% (% of Time Since 08-Nov-2021) ?10/11/2021 Weight: 262 lbs ?11/12/2021 Weight: 262-263 lbs ?11/20/2021 Weight: 273-274 lbs  ?  ?Since 12-Nov-2021 ?Time in AT/AF 0.0 hr/day (0.0%)                                                     ?  ?Spoke with patient and heart failure questions reviewed.  Pt is symptomatic with weight gain, ankle swelling and clothes are fitting tighter.  Reports feeling well at this time and voices no complaints. .  Pt restarted weekly Metolazone last week (was off Metolazone for 2 weeks due to misunderstanding about Metolazone dosage).  Cardioversion completed 3/16  ?  ?Optivol thoracic impedance suggesting possible fluid accumulation starting 3/6 but no improvement after taking weekly Metolazone last week.  Fluid index > normal threshold starting 3/17. ?  ?Prescribed: ?Furosemide 80 mg take 1 tablet every day.  ?Metolazone 2.5 mg Take 1 tablet by mouth once week. ?KLC 20 mEq Take 1 tablet (20 mEq total) by mouth daily.  ?Spironolactone 25 mg take 1 tablet daily ?  ?Labs: ?11/08/2021 Creatinine 1.30, BUN 33, Potassium 4.3, Sodium 138 ?11/02/2021 Creatinine 1.30, BUN 39, Potassium 4.7, Sodium 136, GFR 54  ?10/23/2021 Creatinine 1.29, BUN 31, Potassium 5.3, Sodium 134, GFR 54  ?09/06/2021 Creatinine 1.33, BUN 31, Potassium 4.7, Sodium 136, GFR 52 ?07/09/2021 Creatinine 1.08, BUN 22, Potassium 4.7, Sodium 137, GFR 67 ?06/28/2021 Creatinine 1.39, BUN 32, Potassium 5.0, Sodium 139, GFR 50  ?A complete set of results can be found in Results Review. ?  ?Recommendations: Pt will discuss any concerns with Ermalinda Barrios, PA at [facility] 3/29. ?  ?Follow-up plan: ICM clinic phone appointment on 11/26/2021 to recheck fluid levels.   91 day device clinic remote transmission  01/07/2022.   ?  ?EP/Cardiology Office Visits:  11/21/2021 with with Ermalinda Barrios, PA.  Recall 08/23/2022 with Dr [name]. ?  ?Copy of ICM check sent to Dr. [name] and Ermalinda Barrios PA for review at 3/29 OV.   ? ?3 month ICM trend: 11/20/2021. ? ? ? ?12-14 Month ICM trend:  ? ? ? ?[name], RN ?11/20/2021 ?7:41 AM ? ?

## 2021-11-21 ENCOUNTER — Other Ambulatory Visit: Payer: Self-pay | Admitting: Family Medicine

## 2021-11-21 ENCOUNTER — Encounter: Payer: Self-pay | Admitting: Physician Assistant

## 2021-11-21 ENCOUNTER — Other Ambulatory Visit: Payer: Self-pay

## 2021-11-21 ENCOUNTER — Ambulatory Visit: Payer: Medicare Other | Admitting: Physician Assistant

## 2021-11-21 VITALS — BP 100/68 | HR 70 | Ht 67.0 in | Wt 272.0 lb

## 2021-11-21 DIAGNOSIS — E039 Hypothyroidism, unspecified: Secondary | ICD-10-CM

## 2021-11-21 DIAGNOSIS — Z952 Presence of prosthetic heart valve: Secondary | ICD-10-CM | POA: Diagnosis not present

## 2021-11-21 DIAGNOSIS — E7849 Other hyperlipidemia: Secondary | ICD-10-CM

## 2021-11-21 DIAGNOSIS — I255 Ischemic cardiomyopathy: Secondary | ICD-10-CM

## 2021-11-21 DIAGNOSIS — I480 Paroxysmal atrial fibrillation: Secondary | ICD-10-CM

## 2021-11-21 DIAGNOSIS — I5022 Chronic systolic (congestive) heart failure: Secondary | ICD-10-CM

## 2021-11-21 DIAGNOSIS — I10 Essential (primary) hypertension: Secondary | ICD-10-CM

## 2021-11-21 DIAGNOSIS — Z95810 Presence of automatic (implantable) cardiac defibrillator: Secondary | ICD-10-CM | POA: Diagnosis not present

## 2021-11-21 MED ORDER — METOLAZONE 2.5 MG PO TABS: 2.5000 mg | ORAL_TABLET | ORAL | 3 refills | Status: DC

## 2021-11-21 MED ORDER — POTASSIUM CHLORIDE CRYS ER 20 MEQ PO TBCR: 20.0000 meq | EXTENDED_RELEASE_TABLET | Freq: Every day | ORAL | 3 refills | Status: DC

## 2021-11-21 NOTE — Patient Instructions (Signed)
Medication Instructions:  ?Your physician has recommended you make the following change in your medication:  ? ?INCREASE: Metolazone to twice weekly; Wednesday and Saturday ?Take an extra Potassium on the days you take Metolazone ? ?*If you need a refill on your cardiac medications before your next appointment, please call your pharmacy* ? ? ?Lab Work: ?12/05/2021  ? ?If you have labs (blood work) drawn today and your tests are completely normal, you will receive your results only by: ?MyChart Message (if you have MyChart) OR ?A paper copy in the mail ?If you have any lab test that is abnormal or we need to change your treatment, we will call you to review the results. ? ? ?Follow-Up: ?At [facility], you and your health needs are our priority.  As part of our continuing mission to provide you with exceptional heart care, we have created designated Provider Care Teams.  These Care Teams include your primary Cardiologist (physician) and Advanced Practice Providers (APPs -  Physician Assistants and Nurse Practitioners) who all work together to provide you with the care you need, when you need it. ? ?We recommend signing up for the patient portal called "MyChart".  Sign up information is provided on this After Visit Summary.  MyChart is used to connect with patients for Virtual Visits (Telemedicine).  Patients are able to view lab/test results, encounter notes, upcoming appointments, etc.  Non-urgent messages can be sent to your provider as well.   ?To learn more about what you can do with MyChart, go to [email or web address].   ? ?Your next appointment:   ?As scheduled ?

## 2021-11-23 ENCOUNTER — Ambulatory Visit: Payer: Medicare Other | Admitting: Internal Medicine

## 2021-11-23 ENCOUNTER — Encounter: Payer: Self-pay | Admitting: Internal Medicine

## 2021-11-23 VITALS — BP 104/72 | HR 60 | Ht 67.0 in | Wt 269.0 lb

## 2021-11-23 DIAGNOSIS — I480 Paroxysmal atrial fibrillation: Secondary | ICD-10-CM

## 2021-11-23 DIAGNOSIS — Z79899 Other long term (current) drug therapy: Secondary | ICD-10-CM | POA: Diagnosis not present

## 2021-11-23 DIAGNOSIS — I5022 Chronic systolic (congestive) heart failure: Secondary | ICD-10-CM | POA: Diagnosis not present

## 2021-11-23 MED ORDER — AMIODARONE HCL 200 MG PO TABS: ORAL_TABLET | ORAL | 3 refills | Status: DC

## 2021-11-23 NOTE — Progress Notes (Signed)
? ? ? ? ?Patient Care Team: ?Laurey Morale, MD as PCP - General ?Burnell Blanks, MD as PCP - Cardiology (Cardiology) ?Deboraha Sprang, MD as PCP - Electrophysiology (Cardiology) ?Festus Aloe, MD as Consulting Physician (Urology) ?Viona Gilmore, [facility] as Pharmacist (Pharmacist) ? ? ?HPI ? ?Justin [name] is a 88 y.o. male ?Seen for follow-up of a CRT ICD 9/18 implanted because of syncope  and new left bundle branch block following TAVR ; hx of Afib on dabigitran and ASA;  intercurrent Afib with DCCV 9/22  ? ?Initially significantly improved following CRT.  Now with repeated issues with exercise intolerance.  Some edema.  No chest pain.  Interval atrial fibrillation requiring repeat cardioversion 3/16 not associated with significant symptoms ? ?Significant PVC burden which is affecting his CRT pacing percentage.  Repeat echo as noted below was surprisingly good with some interval improvement (may be) ?ECG prior to March cardioversion was not available.  However, ECG prior to the August cardioversion was associated with tracking at rates over 100. ?  ? ?DATE TEST EF   ?1/18 LHC  Patent grafts  ?8/18 Echo   40-45 %   ?6/19 Echo   40-45 %   ?9/20 Echo  35-40%   ?1/23 Echo 40-45%   ? ? ?DATE Cr K Hgb  ?11/22  1.08 4.7 13.0(8/22)   ? 3/23 1.3 4.3 14.6  ?  ?  ?  ? ?  ? ?Past Medical History:  ?Diagnosis Date  ? Age-related macular degeneration, dry, both eyes   ? Allergic   ? "24/7; 365 days/year; I'm allergic to pollens, dust, all southern grasses/trees, mold, mildue, cats, dogs" (01/07/2017)  ? Anal fissure   ? Asthma   ? sees [name]   ? Benign prostatic hypertrophy   ? (sees Dr. Denman George  ? CAD (coronary artery disease)   ? a. s/p CABG 2007. b. Cath 08/2016 - 4/4 patent grafts.  ? Carotid bruit   ? carotid u/s 10/10: 0.39% bilaterally  ? Chronic combined systolic and diastolic CHF (congestive heart failure) ([place])   ? Complication of anesthesia 1980s  ? "w/anal cyst OR, he gave me a saddle block  then put a narcotic in spinal cord; had a severe reaction to that" (01/07/2017)  ? Congestive heart failure (CHF) ([place])   ? ED (erectile dysfunction)   ? Family history of adverse reaction to anesthesia   ? "daughter wakes up during OR" (01/07/2017)  ? GERD (gastroesophageal reflux disease)   ? Gout   ? HTN (hypertension)   ? Hx of colonic polyps   ? (sees Dr. Henrene Pastor)  ? Hyperlipidemia   ? Hypothyroidism   ? Moderate to severe aortic stenosis   ? a. s/p TAVR 02/2017.  ? Myocardial infarction ([place]) ~ 2000  ? Obesity   ? Osteoarthritis   ? "was in my knees, hands" (01/07/2017 )  ? PAF (paroxysmal atrial fibrillation) ([place])   ? a. documented post TAVR.  ? Precancerous skin lesion   ? (sees Dr. Allyson Sabal)  ? Prostate cancer ([place]) dx'd ~ 2014  ? 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  ? 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  ? 29 mm Edwards Sapien 3 transcatheter heart valve placed via percutaneous right transfemoral approach  ? ? ?Past Surgical History:  ?Procedure Laterality Date  ? ANUS SURGERY    ? "opened it back up cause it wouldn't heal; wound up w/a fissure" (01/07/2017)  ? BIV ICD INSERTION CRT-D N/A 05/02/2017  ?  Procedure: BIV ICD INSERTION CRT-D;  Surgeon: Deboraha Sprang, MD;  Location: [place] [facility];  Service: Cardiovascular;  Laterality: N/A;  ? CARDIAC CATHETERIZATION  10/29/2005  ? CARDIAC CATHETERIZATION N/A 08/27/2016  ? Procedure: Right/Left Heart Cath and Coronary/Graft Angiography;  Surgeon: Burnell Blanks, MD;  Location: [place] [facility];  Service: Cardiovascular;  Laterality: N/A;  ? CARDIOVERSION N/A 04/24/2021  ? Procedure: CARDIOVERSION;  Surgeon: Fay Records, MD;  Location: [place];  Service: Cardiovascular;  Laterality: N/A;  ? CARDIOVERSION N/A 11/08/2021  ? Procedure: CARDIOVERSION;  Surgeon: Pixie Casino, MD;  Location: [facility] ENDOSCOPY;  Service: Cardiovascular;  Laterality: N/A;  ? CATARACT EXTRACTION W/ INTRAOCULAR LENS  IMPLANT, BILATERAL Bilateral   ? CATARACT EXTRACTION,  BILATERAL  2012  ? COLONOSCOPY  06/30/2008  ? no repeats needed   ? COLONOSCOPY    ? had 3 or 4 in the past   ? CORONARY ARTERY BYPASS GRAFT  2007  ? "CABG X4"  ? CYST EXCISION PERINEAL  1980s  ? HAMMER TOE SURGERY Bilateral   ? JOINT REPLACEMENT    ? KNEE ARTHROPLASTY  07/30/2011  ? Procedure: COMPUTER ASSISTED TOTAL KNEE ARTHROPLASTY;  Surgeon: Meredith Pel;  Location: [place];  Service: Orthopedics;  Laterality: Left;  left total knee arthroplasty  ? MASTECTOMY SUBCUTANEOUS Bilateral   ? MULTIPLE TOOTH EXTRACTIONS    ? ORIF FINGER / THUMB FRACTURE Right ~ 1980  ? "repair of thumb injury"  ? PROSTATE BIOPSY    ? REPLACEMENT TOTAL KNEE BILATERAL Bilateral 2012  ? TEE WITHOUT CARDIOVERSION N/A 03/04/2017  ? Procedure: TRANSESOPHAGEAL ECHOCARDIOGRAM (TEE);  Surgeon: Burnell Blanks, MD;  Location: [place];  Service: Open Heart Surgery;  Laterality: N/A;  ? TOTAL KNEE REVISION Right 05/18/2019  ? Procedure: RIGHT PATELLA REVISION/REMOVAL;  Surgeon: Meredith Pel, MD;  Location: [place];  Service: Orthopedics;  Laterality: Right;  ? TRANSCATHETER AORTIC VALVE REPLACEMENT, TRANSFEMORAL N/A 03/04/2017  ? Procedure: TRANSCATHETER AORTIC VALVE REPLACEMENT, TRANSFEMORAL;  Surgeon: Burnell Blanks, MD;  Location: [place];  Service: Open Heart Surgery;  Laterality: N/A;  ? ? ?Current Outpatient Medications  ?Medication Sig Dispense Refill  ? allopurinol (ZYLOPRIM) 100 MG tablet Take 1 tablet (100 mg total) by mouth daily. 90 tablet 3  ? aspirin 81 MG tablet Take 1 tablet (81 mg total) by mouth daily. 30 tablet 0  ? atorvastatin (LIPITOR) 20 MG tablet TAKE ONE TABLET ONCE DAILY 90 tablet 3  ? Azelastine HCl 0.15 % SOLN Place 2 sprays into the nose in the morning and at bedtime. 30 mL 11  ? benzonatate (TESSALON) 200 MG capsule Take 1 capsule (200 mg total) by mouth 2 (two) times daily as needed for cough. 180 capsule 3  ? colchicine 0.6 MG tablet Take 1 tablet (0.6 mg total) by mouth every 6 (six) hours as  needed (gout). 60 tablet 5  ? dabigatran (PRADAXA) 150 MG CAPS capsule TAKE (1) CAPSULE TWICE DAILY. 180 capsule 3  ? diphenhydrAMINE (BENADRYL) 25 MG tablet Take 25 mg by mouth at bedtime.     ? ENTRESTO 97-103 MG TAKE 1 TABLET BY MOUTH TWICE DAILY. 180 tablet 2  ? EPINEPHrine 0.3 mg/0.3 mL IJ SOAJ injection Inject 0.3 mLs (0.3 mg total) into the muscle as needed for anaphylaxis. 2 Device 1  ? famotidine (PEPCID) 20 MG tablet TAKE ONE TABLET BY MOUTH AT BEDTIME. 90 tablet 3  ? fluorouracil (EFUDEX) 5 % cream Apply 1 application topically 2 (  two) times daily as needed (rash).    ? furosemide (LASIX) 80 MG tablet Take 1 tablet (80 mg total) by mouth daily. 90 tablet 3  ? HYDROcodone bit-homatropine (HYCODAN) 5-1.5 MG/5ML syrup Take 5 mLs by mouth every 4 (four) hours as needed for cough. 240 mL 0  ? hydrOXYzine (ATARAX/VISTARIL) 10 MG tablet Take 10 mg by mouth at bedtime.    ? isosorbide mononitrate (IMDUR) 30 MG 24 hr tablet TAKE ONE TABLET ONCE DAILY 90 tablet 0  ? ketotifen (ZADITOR) 0.025 % ophthalmic solution Apply 1 drop to eye daily as needed (itchy eyes).     ? levocetirizine (XYZAL) 5 MG tablet Take 5 mg by mouth daily.    ? levothyroxine (SYNTHROID) 100 MCG tablet TAKE ONE TABLET BY MOUTH DAILY. 90 tablet 0  ? meclizine (ANTIVERT) 25 MG tablet Take 1 tablet (25 mg total) by mouth every 4 (four) hours as needed for dizziness. 60 tablet 2  ? methocarbamol (ROBAXIN) 500 MG tablet Take 1 tablet (500 mg total) by mouth every 8 (eight) hours as needed for muscle spasms. 30 tablet 0  ? metoCLOPramide (REGLAN) 10 MG tablet TAKE ONE TABLET BY MOUTH EVERY MORNING WITH BREAKFAST 30 tablet 0  ? metolazone (ZAROXOLYN) 2.5 MG tablet Take 1 tablet (2.5 mg total) by mouth 2 (two) times a week. Wednesday and Saturday 12 tablet 3  ? metoprolol succinate (TOPROL-XL) 50 MG 24 hr tablet Take 1.5 tablets (75 mg total) by mouth daily. Take with or immediately following a meal. 135 tablet 1  ? montelukast (SINGULAIR) 10 MG tablet  TAKE 1 TABLET ONCE DAILY IN THE EVENING. 90 tablet 3  ? Multiple Vitamins-Minerals (PRESERVISION AREDS PO) Take 2 tablets by mouth every morning.    ? omeprazole (PRILOSEC) 20 MG capsule TAKE (1) CAPSULE TW

## 2021-11-23 NOTE — Patient Instructions (Addendum)
Medication Instructions:  ?Your physician has recommended you make the following change in your medication:  ?START Amiodarone 1 tablet (200 mg total)TWICE daily for 1 month, then reduce and take 1 tablet (200 mg total) ONCE daily ? ?*If you need a refill on your cardiac medications before your next appointment, please call your pharmacy* ? ? ?Lab Work: ?On 4/12 - CMET & TSH ? ?Your physician recommends that you return for lab work in: 6 weeks for CMET & TSH ? ?If you have labs (blood work) drawn today and your tests are completely normal, you will receive your results only by: ?MyChart Message (if you have MyChart) OR ?A paper copy in the mail ?If you have any lab test that is abnormal or we need to change your treatment, we will call you to review the results. ? ? ?Testing/Procedures: ?None ordered ? ? ?Follow-Up: ?At [facility], you and your health needs are our priority.  As part of our continuing mission to provide you with exceptional heart care, we have created designated Provider Care Teams.  These Care Teams include your primary Cardiologist (physician) and Advanced Practice Providers (APPs -  Physician Assistants and Nurse Practitioners) who all work together to provide you with the care you need, when you need it. ? ?Remote monitoring is used to monitor your Pacemaker or ICD from home. This monitoring reduces the number of office visits required to check your device to one time per year. It allows [place] to keep an eye on the functioning of your device to ensure it is working properly. You are scheduled for a device check from home on 11/26/21. You may send your transmission at any time that day. If you have a wireless device, the transmission will be sent automatically. After your physician reviews your transmission, you will receive a postcard with your next transmission date. ? ?Your next appointment:   ?3 month(s) ? ?The format for your next appointment:   ?In Person ? ?Provider:   ?You will see one of  the following Advanced Practice Providers on your designated Care Team:   ?Tommye Standard, PA-C ?Legrand Como "Jonni Sanger" Mazie, PA-C ? ?Then, Virl Axe, MD will plan to see you again in 6 month(s). ? ? ?Thank you for choosing [facility]!! ? ? ?[phone] ? ? ? ?Other Instructions ?  ? ?Amiodarone Tablets ?What is this medication? ?AMIODARONE (a MEE oh da rone) prevents and treats a fast or irregular heartbeat (arrhythmia). It works by slowing down overactive electric signals in the heart, which stabilizes your heart rhythm. It belongs to a group of medications called antiarrhythmics. ?This medicine may be used for other purposes; ask your health care provider or pharmacist if you have questions. ?COMMON BRAND NAME(S): Cordarone, Pacerone ?What should I tell my care team before I take this medication? ?They need to know if you have any of these conditions: ?Liver disease ?Lung disease ?Other heart problems ?Thyroid disease ?An unusual or allergic reaction to amiodarone, iodine, other medications, foods, dyes, or preservatives ?Pregnant or trying to get pregnant ?Breast-feeding ?How should I use this medication? ?Take this medication by mouth with a glass of water. Follow the directions on the prescription label. You can take this medication with or without food. However, you should always take it the same way each time. Take your doses at regular intervals. Do not take your medication more often than directed. Do not stop taking except on the advice of your care team. ?A special MedGuide will be given to  you by the pharmacist with each prescription and refill. Be sure to read this information carefully each time. ?Talk to your care team regarding the use of this medication in children. Special care may be needed. ?Overdosage: If you think you have taken too much of this medicine contact a poison control center or emergency room at once. ?NOTE: This medicine is only for you. Do not share this medicine with  others. ?What if I miss a dose? ?If you miss a dose, take it as soon as you can. If it is almost time for your next dose, take only that dose. Do not take double or extra doses. ?What may interact with this medication? ?Do not take this medication with any of the following: ?Abarelix ?Apomorphine ?Arsenic trioxide ?Certain antibiotics like erythromycin, gemifloxacin, levofloxacin, pentamidine ?Certain medications for depression like amoxapine, tricyclic antidepressants ?Certain medications for fungal infections like fluconazole, itraconazole, ketoconazole, posaconazole, voriconazole ?Certain medications for irregular heartbeat like disopyramide, dronedarone, ibutilide, propafenone, sotalol ?Certain medications for malaria like chloroquine, halofantrine ?Cisapride ?Droperidol ?Haloperidol ?[place] ?Maprotiline ?Methadone ?Phenothiazines like chlorpromazine, mesoridazine, thioridazine ?Pimozide ?Ranolazine ?Red yeast rice ?Vardenafil ?This medication may also interact with the following: ?Antiviral medications for HIV or AIDS ?Certain medications for blood pressure, heart disease, irregular heart beat ?Certain medications for cholesterol like atorvastatin, cerivastatin, lovastatin, simvastatin ?Certain medications for hepatitis C like sofosbuvir and ledipasvir; sofosbuvir ?Certain medications for seizures like phenytoin ?Certain medications for thyroid problems ?Certain medications that treat or prevent blood clots like warfarin ?Cholestyramine ?Cimetidine ?Clopidogrel ?Cyclosporine ?Dextromethorphan ?Diuretics ?Dofetilide ?Fentanyl ?General anesthetics ?Grapefruit juice ?Lidocaine ?Loratadine ?Methotrexate ?Other medications that prolong the QT interval (cause an abnormal heart rhythm) ?Procainamide ?Quinidine ?Rifabutin, rifampin, or rifapentine ?[place] ?Trazodone ?Ziprasidone ?This list may not describe all possible interactions. Give your health care provider a list of all the medicines, herbs,  non-prescription drugs, or dietary supplements you use. Also tell them if you smoke, drink alcohol, or use illegal drugs. Some items may interact with your medicine. ?What should I watch for while using this medication? ?Your condition will be monitored closely when you first begin therapy. Often, this medication is first started in a hospital or other monitored health care setting. Once you are on maintenance therapy, visit your care team for regular checks on your progress. Because your condition and use of this medication carry some risk, it is a good idea to carry an identification card, necklace or bracelet with details of your condition, medications, and care team. ?You may get drowsy or dizzy. Do not drive, use machinery, or do anything that needs mental alertness until you know how this medication affects you. Do not stand or sit up quickly, especially if you are an older patient. This reduces the risk of dizzy or fainting spells. ?This medication can make you more sensitive to the sun. Keep out of the sun. If you cannot avoid being in the sun, wear protective clothing and use sunscreen. Do not use sun lamps or tanning beds/booths. ?You should have regular eye exams before and during treatment. Call your care team if you have blurred vision, see halos, or your eyes become sensitive to light. Your eyes may get dry. It may be helpful to use a lubricating eye solution or artificial tears solution. ?If you are going to have surgery or a procedure that requires contrast dyes, tell your care team that you are taking this medication. ?What side effects may I notice from receiving this medication? ?Side effects that you should report to your  care team as soon as possible: ?Allergic reactions--skin rash, itching, hives, swelling of the face, lips, tongue, or throat ?Bluish-gray skin ?Change in vision such as blurry vision, seeing halos around lights, vision loss ?Heart failure--shortness of breath, swelling of the  ankles, feet, or hands, sudden weight gain, unusual weakness or fatigue ?Heart rhythm changes--fast or irregular heartbeat, dizziness, feeling faint or lightheaded, chest pain, trouble breathing ?High thyroid levels (hyp

## 2021-11-26 ENCOUNTER — Ambulatory Visit (INDEPENDENT_AMBULATORY_CARE_PROVIDER_SITE_OTHER): Payer: Medicare Other

## 2021-11-26 ENCOUNTER — Other Ambulatory Visit: Payer: Self-pay | Admitting: *Deleted

## 2021-11-26 DIAGNOSIS — I480 Paroxysmal atrial fibrillation: Secondary | ICD-10-CM

## 2021-11-26 DIAGNOSIS — I5022 Chronic systolic (congestive) heart failure: Secondary | ICD-10-CM

## 2021-11-26 DIAGNOSIS — Z79899 Other long term (current) drug therapy: Secondary | ICD-10-CM

## 2021-11-26 DIAGNOSIS — Z95810 Presence of automatic (implantable) cardiac defibrillator: Secondary | ICD-10-CM

## 2021-11-26 NOTE — Progress Notes (Signed)
EPIC Encounter for ICM Monitoring ? ?Patient Name: Justin [name] is a 88 y.o. male ?Date: 11/26/2021 ?Primary Care Physican: Laurey Morale, MD ?Primary Cardiologist: Angelena Form ?Electrophysiologist: [name] ?Bi-V Pacing: 79.9% Effective 78.3% (% of Time Since 23-Nov-2021) ?10/11/2021 Weight: 262 lbs ?11/12/2021 Weight: 262-263 lbs ?11/20/2021 Weight: 273-274 lbs  ?11/26/2021 Weight: 267 lbs ? ?Since 12-Nov-2021 ?Time in AT/AF  0.0 hr/day (0.0%)                                                     ?  ?Spoke with patient and heart failure questions reviewed.  Pt reports he still has puffiness in ankles and weight is starting to decreased.  Started Amiodarone 4/3.   He is limiting fluids and avoiding restaurant foods.  He does occasionally add salt to tomatoes.   ?  ?Optivol thoracic impedance suggesting possible fluid accumulation starting 3/6 but slightly improved since taking Metolazone on 4/1.  Fluid index > normal threshold starting 3/17. ?  ?Prescribed: ?Furosemide 80 mg take 1 tablet every day.  ?Metolazone 2.5 mg Take 1 tablet by mouth twice a week Wednesday and Saturday (increased on 3/29). ?KLC 20 mEq Take 1 tablet (20 mEq total) by mouth daily.  Take 2 tablets on Wed and Saturday with Metolazone. ?Spironolactone 25 mg take 1 tablet daily ?  ?Labs: ?12/05/2021 BMET Scheduled ?11/08/2021 Creatinine 1.30, BUN 33, Potassium 4.3, Sodium 138 ?11/02/2021 Creatinine 1.30, BUN 39, Potassium 4.7, Sodium 136, GFR 54  ?10/23/2021 Creatinine 1.29, BUN 31, Potassium 5.3, Sodium 134, GFR 54  ?09/06/2021 Creatinine 1.33, BUN 31, Potassium 4.7, Sodium 136, GFR 52 ?07/09/2021 Creatinine 1.08, BUN 22, Potassium 4.7, Sodium 137, GFR 67 ?06/28/2021 Creatinine 1.39, BUN 32, Potassium 5.0, Sodium 139, GFR 50  ?A complete set of results can be found in Results Review. ?  ?Recommendations: Discussed avoid using salt shaker and continue to limit salt and fluid intake.   Confirms taking Metolazone twice a week since 3/29 OV.   ?  ?Follow-up  plan: ICM clinic phone appointment on 12/10/2021 to recheck fluid levels.   91 day device clinic remote transmission 01/07/2022.   ?  ?EP/Cardiology Office Visits:  02/19/2022 with with Ermalinda Barrios, PA.  02/22/2022 with Tommye Standard, PA. ?  ?Copy of ICM check sent to Dr. [name].  Copy sent to Ermalinda Barrios, PA as Juluis Rainier and review.   ? ?PVC ?                                           Prior to Last Session                 Since Last Session ?                                           28 Mar-2023 to 23-Nov-2021 (4 days)      23-Nov-2021 to 26-Nov-2021 (2 days) ?PVC Runs (2-4 beats)      10.0 per hour      11.7 per hour  ?PVC Singles  231.9 per hour    285.4 per hour  ? ?3 month ICM trend: 11/26/2021. ? ? ? ?12-14 Month ICM trend:  ? ? ? ?[name], RN ?11/26/2021 ?10:45 AM ? ?

## 2021-11-27 NOTE — Progress Notes (Signed)
Justin Burn, PA-C  Nysa Sarin Panda, RN ?Thanks!!  ?

## 2021-12-05 ENCOUNTER — Other Ambulatory Visit: Payer: Medicare Other | Admitting: *Deleted

## 2021-12-05 DIAGNOSIS — I480 Paroxysmal atrial fibrillation: Secondary | ICD-10-CM

## 2021-12-05 DIAGNOSIS — Z79899 Other long term (current) drug therapy: Secondary | ICD-10-CM | POA: Diagnosis not present

## 2021-12-05 DIAGNOSIS — I5022 Chronic systolic (congestive) heart failure: Secondary | ICD-10-CM

## 2021-12-06 LAB — COMPREHENSIVE METABOLIC PANEL
ALT: 9 IU/L (ref 0–44)
AST: 13 IU/L (ref 0–40)
Albumin/Globulin Ratio: 1.6 (ref 1.2–2.2)
Albumin: 3.6 g/dL (ref 3.6–4.6)
Alkaline Phosphatase: 94 IU/L (ref 44–121)
BUN/Creatinine Ratio: 27 — ABNORMAL HIGH (ref 10–24)
BUN: 36 mg/dL — ABNORMAL HIGH (ref 8–27)
Bilirubin Total: 0.5 mg/dL (ref 0.0–1.2)
CO2: 19 mmol/L — ABNORMAL LOW (ref 20–29)
Calcium: 8.7 mg/dL (ref 8.6–10.2)
Chloride: 103 mmol/L (ref 96–106)
Creatinine, Ser: 1.34 mg/dL — ABNORMAL HIGH (ref 0.76–1.27)
Globulin, Total: 2.3 g/dL (ref 1.5–4.5)
Glucose: 124 mg/dL — ABNORMAL HIGH (ref 70–99)
Potassium: 5.3 mmol/L — ABNORMAL HIGH (ref 3.5–5.2)
Sodium: 136 mmol/L (ref 134–144)
Total Protein: 5.9 g/dL — ABNORMAL LOW (ref 6.0–8.5)
eGFR: 52 mL/min/{1.73_m2} — ABNORMAL LOW (ref >59)

## 2021-12-06 LAB — TSH: TSH: 4.72 u[IU]/mL — ABNORMAL HIGH (ref 0.450–4.500)

## 2021-12-07 ENCOUNTER — Telehealth: Payer: Self-pay | Admitting: Nurse Practitioner

## 2021-12-07 DIAGNOSIS — I5022 Chronic systolic (congestive) heart failure: Secondary | ICD-10-CM

## 2021-12-07 DIAGNOSIS — I480 Paroxysmal atrial fibrillation: Secondary | ICD-10-CM

## 2021-12-07 DIAGNOSIS — Z79899 Other long term (current) drug therapy: Secondary | ICD-10-CM

## 2021-12-07 MED ORDER — POTASSIUM CHLORIDE CRYS ER 20 MEQ PO TBCR: 20.0000 meq | EXTENDED_RELEASE_TABLET | Freq: Every day | ORAL | 3 refills | Status: DC

## 2021-12-07 NOTE — Telephone Encounter (Signed)
The patient has been notified of the result and verbalized understanding.  All questions (if any) were answered. ?Justin Iba, RN 12/07/2021 4:18 PM  ? ?Patient reports that his weight is still up and he is still having swelling in his ankles. Denies SOB.  ?He will decrease potassium supplement and repeat labs in two weeks.  ?

## 2021-12-07 NOTE — Telephone Encounter (Signed)
Left message for patient to call back.  ? ?Imogene Burn, PA-C  ?12/06/2021  8:10 AM EDT   ?  ?Kidney function up and K up. Ask how his fluid status is and if weight down? Decrease K to one tablet when he takes metolazone. See if he can skip one metolazone this week. Copying Sharman Cheek. Repeat bmet in 2 weeks  ? ?

## 2021-12-07 NOTE — Telephone Encounter (Signed)
Follow Up: ? ? ?Patient is returning call, about his lab results. ?

## 2021-12-10 ENCOUNTER — Ambulatory Visit (INDEPENDENT_AMBULATORY_CARE_PROVIDER_SITE_OTHER): Payer: Medicare Other

## 2021-12-10 DIAGNOSIS — Z95810 Presence of automatic (implantable) cardiac defibrillator: Secondary | ICD-10-CM

## 2021-12-10 DIAGNOSIS — I5022 Chronic systolic (congestive) heart failure: Secondary | ICD-10-CM

## 2021-12-10 NOTE — Progress Notes (Signed)
EPIC Encounter for ICM Monitoring ? ?Patient Name: Justin [name] is a 88 y.o. male ?Date: 12/10/2021 ?Primary Care Physican: Laurey Morale, MD ?Primary Cardiologist: Angelena Form ?Electrophysiologist: [name] ?Bi-V Pacing: 73.2% Effective 69.6%   (% of Time Since 26-Nov-2021) ?10/11/2021 Weight: 262 lbs ?11/12/2021 Weight: 262-263 lbs ?11/20/2021 Weight: 273-274 lbs  ?11/26/2021 Weight:   267 lbs ?12/11/2021 Weight: 267 lbs ? ?Time in AT/AF  0.0 hr/day (0.0%)   Since 26-Nov-2021                                                 ?  ?Spoke with patient and heart failure questions reviewed.  He reports being 4-5 lbs over baseline weight of 162-163 lbs. Still experiences SOB when walking distances and leg swelling remains unchanged. He is limiting salt and fluid intake as recommended by physician/PA. ?  ?Optivol thoracic impedance suggesting possible fluid accumulation continues since 3/6 but returned close to baseline.  Fluid index remains higher than normal threshold starting 3/17. ?  ?Prescribed: ?Furosemide 80 mg take 1 tablet every day.  ?Metolazone 2.5 mg Take 1 tablet by mouth twice a week Wednesday and Saturday (increased on 3/29). ?KLC 20 mEq Take 1 tablet (20 mEq total) by mouth daily.   Per 4/14 phone note, decrease K+ to 1 tablet with Metolazone ?Spironolactone 25 mg take 1 tablet daily ?  ?Labs: BMET scheduled 4/28 ?12/05/2021 Creatinine 1.34, BUN 36, Potassium 5.3, Sodium 136, GFR 52 ?11/08/2021 Creatinine 1.30, BUN 33, Potassium 4.3, Sodium 138 ?11/02/2021 Creatinine 1.30, BUN 39, Potassium 4.7, Sodium 136, GFR 54  ?10/23/2021 Creatinine 1.29, BUN 31, Potassium 5.3, Sodium 134, GFR 54  ?09/06/2021 Creatinine 1.33, BUN 31, Potassium 4.7, Sodium 136, GFR 52 ?07/09/2021 Creatinine 1.08, BUN 22, Potassium 4.7, Sodium 137, GFR 67 ?06/28/2021 Creatinine 1.39, BUN 32, Potassium 5.0, Sodium 139, GFR 50  ?A complete set of results can be found in Results Review. ?  ?Recommendations: Continue to follow recommendations to  limit salt and fluid intake.  Will call back if any recommendations are given.  ?  ?Follow-up plan: ICM clinic phone appointment on 12/17/2021 for 31 day and recheck fluid levels.   91 day device clinic remote transmission 01/07/2022.   ?  ?EP/Cardiology Office Visits:  02/19/2022 with with Ermalinda Barrios, PA.  02/22/2022 with Tommye Standard, PA. ?  ?Copy of ICM check sent to Dr. [name] and Ermalinda Barrios, PA for review and recommendations if needed. ? ?    March 31 to April 3      April 3 to April 17 ?PVC Runs (2-4 beats) 11.7 per hour 18.4 per hour    ?PVC Singles              285.4 per hour 314.2 per hour    ? ?3 month ICM trend: 12/10/2021. ? ? ? ?12-14 Month ICM trend:  ? ? ? ?[name], RN ?12/10/2021 ?12:17 PM ? ?

## 2021-12-17 ENCOUNTER — Ambulatory Visit (INDEPENDENT_AMBULATORY_CARE_PROVIDER_SITE_OTHER): Payer: Medicare Other

## 2021-12-17 DIAGNOSIS — Z95810 Presence of automatic (implantable) cardiac defibrillator: Secondary | ICD-10-CM | POA: Diagnosis not present

## 2021-12-17 DIAGNOSIS — I5022 Chronic systolic (congestive) heart failure: Secondary | ICD-10-CM | POA: Diagnosis not present

## 2021-12-17 NOTE — Progress Notes (Signed)
EPIC Encounter for ICM Monitoring ? ?Patient Name: Justin [name] is a 88 y.o. male ?Date: 12/17/2021 ?Primary Care Physican: Laurey Morale, MD ?Primary Cardiologist: Angelena Form ?Electrophysiologist: [name] ?Bi-V Pacing: 70% Effective 65.9%  (Since 10-Dec-2021) ?10/11/2021 Weight: 262 lbs ?11/12/2021 Weight: 262-263 lbs ?11/20/2021 Weight: 273-274 lbs  ?11/26/2021 Weight:   267 lbs ?12/11/2021 Weight: 267 lbs ?12/17/2021 Weight: No current weight ?  ?Time in AT/AF  0.0 hr/day (0.0%)   Since 26-Nov-2021                                                 ?  ?Spoke with patient and heart failure questions reviewed.  Pt describes puffiness above sock line but definitely improving. ?  ?Optivol thoracic impedance suggesting fluid levels returned to normal.  Fluid index remains higher than normal threshold starting 3/17 but trending down. ?  ?Prescribed: ?Furosemide 80 mg take 1 tablet every day.  ?Metolazone 2.5 mg Take 1 tablet by mouth twice a week Wednesday and Saturday (increased on 3/29). ?KLC 20 mEq Take 1 tablet (20 mEq total) by mouth daily.   Per 4/14 phone note, decrease K+ to 1 tablet with Metolazone ?Spironolactone 25 mg take 1 tablet daily ?  ?Labs:  ?12/21/2021 BMET Scheduled ?12/05/2021 Creatinine 1.34, BUN 36, Potassium 5.3, Sodium 136, GFR 52 ?11/08/2021 Creatinine 1.30, BUN 33, Potassium 4.3, Sodium 138 ?11/02/2021 Creatinine 1.30, BUN 39, Potassium 4.7, Sodium 136, GFR 54  ?10/23/2021 Creatinine 1.29, BUN 31, Potassium 5.3, Sodium 134, GFR 54  ?A complete set of results can be found in Results Review. ?  ?Recommendations:  No changes and encouraged to call if experiencing any fluid symptoms. ?  ?Follow-up plan: ICM clinic phone appointment on 01/07/2022 (to recheck fluid levels).   91 day device clinic remote transmission 01/07/2022.   ?  ?EP/Cardiology Office Visits:  02/19/2022 with with Ermalinda Barrios, PA.  02/22/2022 with Tommye Standard, PA. ?  ?Copy of ICM check sent to Dr. [name]. ?  ?                                                April 3 to April 17           April 17 to April 24 ?PVC Runs (2-4 beats)          18.4       per hour            37.8      per hour                             ?PVC Singles                           314.2     per hour           428.4    per hour    ? ?3 month ICM trend: 12/17/2021. ? ? ? ?12-14 Month ICM trend:  ? ? ? ?[name], RN ?12/17/2021 ?10:12 AM ? ?

## 2021-12-19 ENCOUNTER — Encounter: Payer: Self-pay | Admitting: Family Medicine

## 2021-12-19 ENCOUNTER — Ambulatory Visit (INDEPENDENT_AMBULATORY_CARE_PROVIDER_SITE_OTHER): Payer: Medicare Other | Admitting: Family Medicine

## 2021-12-19 VITALS — BP 116/64 | HR 60 | Temp 97.7°F | Wt 269.0 lb

## 2021-12-19 DIAGNOSIS — N390 Urinary tract infection, site not specified: Secondary | ICD-10-CM

## 2021-12-19 LAB — POC URINALSYSI DIPSTICK (AUTOMATED)
Bilirubin, UA: NEGATIVE
Blood, UA: NEGATIVE
Glucose, UA: NEGATIVE
Ketones, UA: NEGATIVE
Nitrite, UA: NEGATIVE
Protein, UA: POSITIVE — AB
Spec Grav, UA: 1.015 (ref 1.010–1.025)
Urobilinogen, UA: 0.2 E.U./dL
pH, UA: 5 (ref 5.0–8.0)

## 2021-12-19 MED ORDER — CIPROFLOXACIN HCL 500 MG PO TABS: 500.0000 mg | ORAL_TABLET | Freq: Two times a day (BID) | ORAL | 0 refills | Status: AC

## 2021-12-19 NOTE — Addendum Note (Signed)
Addended by: Wyvonne Lenz on: 12/19/2021 03:30 PM ? ? Modules accepted: Orders ? ?

## 2021-12-19 NOTE — Progress Notes (Signed)
? ?  Subjective:  ? ? Patient ID: Justin [name], male    DOB: 1933-10-05, 88 y.o.   MRN: [id number] ? ?HPI ?Here for 3 days of sharp pains in the right lower back. These often wake him up at night. He feels better when standing, the pain is worse when he is sitting. No recent trauma. Now in addition for the past 24 hours he has felt "bad", he is weak, his appetite is poor, and he has been nauseated (but no vomiting). No fever. No urgency to urinate, but he has felt some slight burning on urination. He had 2 normal BM's this morning.  ? ? ?Review of Systems  ?Constitutional:  Positive for fatigue. Negative for chills, diaphoresis and fever.  ?Respiratory: Negative.    ?Cardiovascular: Negative.   ?Gastrointestinal:  Positive for nausea. Negative for abdominal distention, abdominal pain, blood in stool, constipation, diarrhea and vomiting.  ?Genitourinary:  Positive for dysuria and frequency. Negative for difficulty urinating, hematuria and urgency.  ? ?   ?Objective:  ? Physical Exam ?Constitutional:   ?   Appearance: Normal appearance. He is not ill-appearing.  ?Cardiovascular:  ?   Rate and Rhythm: Normal rate and regular rhythm.  ?   Pulses: Normal pulses.  ?   Heart sounds: Normal heart sounds.  ?Pulmonary:  ?   Effort: Pulmonary effort is normal.  ?   Breath sounds: Normal breath sounds.  ?Abdominal:  ?   General: Abdomen is flat. Bowel sounds are normal. There is no distension.  ?   Palpations: Abdomen is soft. There is no mass.  ?   Tenderness: There is right CVA tenderness. There is no left CVA tenderness, guarding or rebound.  ?   Hernia: No hernia is present.  ?   Comments: Mildly tender in the RLQ   ?Neurological:  ?   Mental Status: He is alert.  ? ? ? ? ? ?   ?Assessment & Plan:  ?UTI, treat with 10 days of Cipro. Drink plenty of water. Culture the sample.  ?[name], MD ? ? ?

## 2021-12-21 ENCOUNTER — Other Ambulatory Visit: Payer: Medicare Other | Admitting: *Deleted

## 2021-12-21 DIAGNOSIS — I5022 Chronic systolic (congestive) heart failure: Secondary | ICD-10-CM

## 2021-12-21 DIAGNOSIS — I480 Paroxysmal atrial fibrillation: Secondary | ICD-10-CM

## 2021-12-21 DIAGNOSIS — Z79899 Other long term (current) drug therapy: Secondary | ICD-10-CM

## 2021-12-21 LAB — URINE CULTURE
MICRO NUMBER:: 13315284
SPECIMEN QUALITY:: ADEQUATE

## 2021-12-22 LAB — BASIC METABOLIC PANEL
BUN/Creatinine Ratio: 28 — ABNORMAL HIGH (ref 10–24)
BUN: 51 mg/dL — ABNORMAL HIGH (ref 8–27)
CO2: 19 mmol/L — ABNORMAL LOW (ref 20–29)
Calcium: 8.5 mg/dL — ABNORMAL LOW (ref 8.6–10.2)
Chloride: 101 mmol/L (ref 96–106)
Creatinine, Ser: 1.81 mg/dL — ABNORMAL HIGH (ref 0.76–1.27)
Glucose: 92 mg/dL (ref 70–99)
Potassium: 5.1 mmol/L (ref 3.5–5.2)
Sodium: 135 mmol/L (ref 134–144)
eGFR: 36 mL/min/{1.73_m2} — ABNORMAL LOW (ref >59)

## 2021-12-25 ENCOUNTER — Other Ambulatory Visit: Payer: Self-pay

## 2021-12-25 DIAGNOSIS — H353221 Exudative age-related macular degeneration, left eye, with active choroidal neovascularization: Secondary | ICD-10-CM | POA: Diagnosis not present

## 2021-12-25 DIAGNOSIS — I5022 Chronic systolic (congestive) heart failure: Secondary | ICD-10-CM

## 2021-12-25 DIAGNOSIS — I480 Paroxysmal atrial fibrillation: Secondary | ICD-10-CM

## 2021-12-27 ENCOUNTER — Telehealth: Payer: Self-pay

## 2021-12-27 DIAGNOSIS — E039 Hypothyroidism, unspecified: Secondary | ICD-10-CM

## 2021-12-27 DIAGNOSIS — E875 Hyperkalemia: Secondary | ICD-10-CM

## 2021-12-27 DIAGNOSIS — Z79899 Other long term (current) drug therapy: Secondary | ICD-10-CM

## 2021-12-27 NOTE — Telephone Encounter (Signed)
-----   Message from Deboraha Sprang, MD sent at 12/25/2021 10:39 AM EDT ----- ?Please Inform Patient ? ?Labs are normal x K 5.3  can we recheck please ? And TSH a little bit elevated  will recheck in 3 months plz  ? ?Thanks  ?

## 2021-12-27 NOTE — Telephone Encounter (Signed)
Spoke with pt and advised per Dr [name] K+ is elevated and will need to recheck as well as TSH is slightly elevated and will need to be rechecked in 3 months per Dr [name]. ?Pt verbalized understanding and appointment scheduled for 01/01/2022 for BMET and 03/2022 for TSH.   ?

## 2021-12-29 ENCOUNTER — Other Ambulatory Visit: Payer: Self-pay | Admitting: Family Medicine

## 2021-12-31 DIAGNOSIS — H353132 Nonexudative age-related macular degeneration, bilateral, intermediate dry stage: Secondary | ICD-10-CM | POA: Diagnosis not present

## 2021-12-31 DIAGNOSIS — Z961 Presence of intraocular lens: Secondary | ICD-10-CM | POA: Diagnosis not present

## 2022-01-01 ENCOUNTER — Other Ambulatory Visit: Payer: Medicare Other | Admitting: *Deleted

## 2022-01-01 DIAGNOSIS — E875 Hyperkalemia: Secondary | ICD-10-CM

## 2022-01-01 DIAGNOSIS — E039 Hypothyroidism, unspecified: Secondary | ICD-10-CM | POA: Diagnosis not present

## 2022-01-01 DIAGNOSIS — Z79899 Other long term (current) drug therapy: Secondary | ICD-10-CM

## 2022-01-02 LAB — BASIC METABOLIC PANEL
BUN/Creatinine Ratio: 29 — ABNORMAL HIGH (ref 10–24)
BUN: 47 mg/dL — ABNORMAL HIGH (ref 8–27)
CO2: 18 mmol/L — ABNORMAL LOW (ref 20–29)
Calcium: 8.2 mg/dL — ABNORMAL LOW (ref 8.6–10.2)
Chloride: 101 mmol/L (ref 96–106)
Creatinine, Ser: 1.6 mg/dL — ABNORMAL HIGH (ref 0.76–1.27)
Glucose: 133 mg/dL — ABNORMAL HIGH (ref 70–99)
Potassium: 4.6 mmol/L (ref 3.5–5.2)
Sodium: 132 mmol/L — ABNORMAL LOW (ref 134–144)
eGFR: 42 mL/min/{1.73_m2} — ABNORMAL LOW (ref >59)

## 2022-01-04 ENCOUNTER — Other Ambulatory Visit: Payer: Medicare Other

## 2022-01-07 ENCOUNTER — Ambulatory Visit (INDEPENDENT_AMBULATORY_CARE_PROVIDER_SITE_OTHER): Payer: Medicare Other

## 2022-01-07 DIAGNOSIS — I255 Ischemic cardiomyopathy: Secondary | ICD-10-CM | POA: Diagnosis not present

## 2022-01-08 ENCOUNTER — Telehealth: Payer: Self-pay

## 2022-01-08 ENCOUNTER — Ambulatory Visit (INDEPENDENT_AMBULATORY_CARE_PROVIDER_SITE_OTHER): Payer: Medicare Other

## 2022-01-08 DIAGNOSIS — I5022 Chronic systolic (congestive) heart failure: Secondary | ICD-10-CM

## 2022-01-08 DIAGNOSIS — Z95810 Presence of automatic (implantable) cardiac defibrillator: Secondary | ICD-10-CM

## 2022-01-08 LAB — CUP PACEART REMOTE DEVICE CHECK
Battery Remaining Longevity: 14 mo
Battery Voltage: 2.89 V
Brady Statistic AP VP Percent: 50.03 %
Brady Statistic AP VS Percent: 1.35 %
Brady Statistic AS VP Percent: 39.61 %
Brady Statistic AS VS Percent: 9.01 %
Brady Statistic RA Percent Paced: 47.64 %
Brady Statistic RV Percent Paced: 77.35 %
Date Time Interrogation Session: 20230513063424
HighPow Impedance: 80 Ohm
Implantable Lead Implant Date: 20180907
Implantable Lead Implant Date: 20180907
Implantable Lead Implant Date: 20180907
Implantable Lead Location: 753858
Implantable Lead Location: 753859
Implantable Lead Location: 753860
Implantable Lead Model: 4396
Implantable Lead Model: 5076
Implantable Pulse Generator Implant Date: 20180907
Lead Channel Impedance Value: 342 Ohm
Lead Channel Impedance Value: 342 Ohm
Lead Channel Impedance Value: 399 Ohm
Lead Channel Impedance Value: 513 Ohm
Lead Channel Impedance Value: 646 Ohm
Lead Channel Impedance Value: 988 Ohm
Lead Channel Pacing Threshold Amplitude: 0.875 V
Lead Channel Pacing Threshold Amplitude: 1 V
Lead Channel Pacing Threshold Amplitude: 1.625 V
Lead Channel Pacing Threshold Pulse Width: 0.4 ms
Lead Channel Pacing Threshold Pulse Width: 0.4 ms
Lead Channel Pacing Threshold Pulse Width: 1 ms
Lead Channel Sensing Intrinsic Amplitude: 1.875 mV
Lead Channel Sensing Intrinsic Amplitude: 1.875 mV
Lead Channel Sensing Intrinsic Amplitude: 14.5 mV
Lead Channel Sensing Intrinsic Amplitude: 14.5 mV
Lead Channel Setting Pacing Amplitude: 1.75 V
Lead Channel Setting Pacing Amplitude: 2 V
Lead Channel Setting Pacing Amplitude: 2.5 V
Lead Channel Setting Pacing Pulse Width: 0.4 ms
Lead Channel Setting Pacing Pulse Width: 1 ms
Lead Channel Setting Sensing Sensitivity: 0.3 mV

## 2022-01-08 NOTE — Telephone Encounter (Signed)
Spoke with patient, patient asymptomatic with AF patient stated he will wait to hear from Dr. [name] regarding next steps for atrial fibrillation.  ?

## 2022-01-08 NOTE — Progress Notes (Signed)
EPIC Encounter for ICM Monitoring ? ?Patient Name: Justin [name] is a 88 y.o. male ?Date: 01/08/2022 ?Primary Care Physican: Laurey Morale, MD ?Primary Cardiologist: Angelena Form ?Electrophysiologist: [name] ?Bi-V Pacing: 77.4% Effective 73.1%  (% of Time Since 17-Dec-2021) ?11/26/2021 Weight:   267 lbs ?12/11/2021 Weight: 267 lbs ?12/17/2021 Weight: No current weight ?01/08/2022 Weight: 260 lbs ?  ?Since 17-Dec-2021 ?AT/AF 113 ?Time in AT/AF 7.6 hr/day (31.7%) ?Longest AT/AF 18 hours                                               ?  ?Spoke with patient and heart failure questions reviewed.  Pt denies any fluid symptoms and does not having any symptoms when he is out of NSR.    ?  ?Optivol thoracic impedance suggesting fluid levels returned to normal.  Fluid index remains higher than normal threshold starting 3/17 but trending down. ?  ?Prescribed: ?Furosemide 80 mg take 1 tablet every day.  ?Metolazone 2.5 mg Take 1 tablet by mouth twice a week Wednesday and Saturday (increased on 3/29). ?KLC 20 mEq Take 1 tablet (20 mEq total) by mouth daily.   Per 4/14 phone note, decrease K+ to 1 tablet with Metolazone ?Spironolactone 25 mg take 1 tablet daily ?  ?Labs:  ?01/15/2022 BMET scheduled ?01/01/2022 Creatinine 1.60, BUN 47, Potassium 4.6, Sodium 132, GFR 42 ?12/21/2021 Creatinine 1.81, BUN 51, Potassium 5.1, Sodium 135, GFR 36 ?12/05/2021 Creatinine 1.34, BUN 36, Potassium 5.3, Sodium 136, GFR 52 ?11/08/2021 Creatinine 1.30, BUN 33, Potassium 4.3, Sodium 138 ?11/02/2021 Creatinine 1.30, BUN 39, Potassium 4.7, Sodium 136, GFR 54  ?10/23/2021 Creatinine 1.29, BUN 31, Potassium 5.3, Sodium 134, GFR 54  ?A complete set of results can be found in Results Review. ?  ?Recommendations:  No changes and encouraged to call if experiencing any fluid symptoms. ?  ?Follow-up plan: ICM clinic phone appointment on 01/22/2022.   91 day device clinic remote transmission 04/08/2022.   ?  ?EP/Cardiology Office Visits:  02/19/2022 with with Ermalinda Barrios, PA.  02/22/2022 with Tommye Standard, PA. ?  ?Copy of ICM check sent to Dr. [name]. ?  ?                                               April 17 to April 24         17-Dec-2021 to 05-Jan-2022 ?PVC Runs (2-4 beats)          37.8      per hour             16.7 per hour                              ?PVC Singles                           428.4    per hour             117.4 per hour   ? ?3 month ICM trend: 01/05/2022. ? ? ? ?12-14 Month ICM trend:  ? ? ? ?[name], RN ?01/08/2022 ?2:32 PM ? ?

## 2022-01-08 NOTE — Telephone Encounter (Signed)
LVM for patient to call device clinic back regarding remote transmission  ? ?Presenting rhythm AF/flutter ongoing from 5/12 @ 23:28, controlled rates ?Burden 31.7%, Pradaxa ?Sub optimal BiV pacing 77.4% ?Next remote 91 days ? ?Calling to assess for symptoms of AF/Low bi-vp ?

## 2022-01-10 NOTE — Telephone Encounter (Signed)
Spoke with pt who states he continues to "feel fine" and denies current CP, SOB or dizziness/fainting.  He states he cannot tell he is in Afib.  He is taking Pradaxa and Amiodarone as prescribed.  Reviewed ED precautions.  Pt verbalizes understanding and thanked Therapist, sports for the call.

## 2022-01-15 ENCOUNTER — Other Ambulatory Visit: Payer: Medicare Other | Admitting: *Deleted

## 2022-01-15 DIAGNOSIS — I480 Paroxysmal atrial fibrillation: Secondary | ICD-10-CM | POA: Diagnosis not present

## 2022-01-15 DIAGNOSIS — I5022 Chronic systolic (congestive) heart failure: Secondary | ICD-10-CM | POA: Diagnosis not present

## 2022-01-16 LAB — BASIC METABOLIC PANEL
BUN/Creatinine Ratio: 27 — ABNORMAL HIGH (ref 10–24)
BUN: 40 mg/dL — ABNORMAL HIGH (ref 8–27)
CO2: 19 mmol/L — ABNORMAL LOW (ref 20–29)
Calcium: 8.4 mg/dL — ABNORMAL LOW (ref 8.6–10.2)
Chloride: 101 mmol/L (ref 96–106)
Creatinine, Ser: 1.48 mg/dL — ABNORMAL HIGH (ref 0.76–1.27)
Glucose: 115 mg/dL — ABNORMAL HIGH (ref 70–99)
Potassium: 5.1 mmol/L (ref 3.5–5.2)
Sodium: 135 mmol/L (ref 134–144)
eGFR: 46 mL/min/{1.73_m2} — ABNORMAL LOW (ref >59)

## 2022-01-23 NOTE — Telephone Encounter (Signed)
Lets check another transmission to see what rhythm see him.  If he is still out of rhythm, we should arrange for cardioversion.  Thanks perhaps the A-fib clinic and help [place] get this done.

## 2022-01-24 NOTE — Telephone Encounter (Signed)
Spoke with patient he stated that he would send a transmission later today.

## 2022-01-24 NOTE — Telephone Encounter (Signed)
Transmission received patient in atrial flutter apt scheduled with AF clinic 01/30/2022, patient agreeable to appointment

## 2022-01-25 NOTE — Progress Notes (Signed)
Remote ICD transmission.   

## 2022-01-28 ENCOUNTER — Ambulatory Visit (INDEPENDENT_AMBULATORY_CARE_PROVIDER_SITE_OTHER): Payer: Medicare Other

## 2022-01-28 DIAGNOSIS — Z95810 Presence of automatic (implantable) cardiac defibrillator: Secondary | ICD-10-CM | POA: Diagnosis not present

## 2022-01-28 DIAGNOSIS — I5022 Chronic systolic (congestive) heart failure: Secondary | ICD-10-CM

## 2022-01-29 ENCOUNTER — Telehealth: Payer: Self-pay

## 2022-01-29 NOTE — Telephone Encounter (Signed)
-----   Message from Deboraha Sprang, MD sent at 01/28/2022  7:19 PM EDT ----- Remote reviewed. This remote is abnormal for persistent atrial flutter   can you have him transmit again and see what his rhythm is  thx

## 2022-01-29 NOTE — Telephone Encounter (Signed)
Patient remains out of rhythm, see below.

## 2022-01-29 NOTE — Telephone Encounter (Signed)
Spoke to pt and he will send transmission in a few minutes. Pt has AF clinic apt tomorrow 6/7 to discuss cardioversion.

## 2022-01-30 ENCOUNTER — Ambulatory Visit (HOSPITAL_COMMUNITY)
Admission: RE | Admit: 2022-01-30 | Discharge: 2022-01-30 | Disposition: A | Discharge status: Home / Self Care | Payer: Medicare Other | Source: Ambulatory Visit | Attending: Physician Assistant | Admitting: Physician Assistant

## 2022-01-30 ENCOUNTER — Other Ambulatory Visit: Payer: Self-pay | Admitting: Family Medicine

## 2022-01-30 ENCOUNTER — Other Ambulatory Visit: Payer: Self-pay | Admitting: Cardiovascular Disease

## 2022-01-30 VITALS — BP 86/46 | HR 68 | Ht 67.0 in | Wt 268.0 lb

## 2022-01-30 DIAGNOSIS — Z951 Presence of aortocoronary bypass graft: Secondary | ICD-10-CM | POA: Insufficient documentation

## 2022-01-30 DIAGNOSIS — Z79899 Other long term (current) drug therapy: Secondary | ICD-10-CM | POA: Diagnosis not present

## 2022-01-30 DIAGNOSIS — I480 Paroxysmal atrial fibrillation: Secondary | ICD-10-CM | POA: Diagnosis not present

## 2022-01-30 DIAGNOSIS — I484 Atypical atrial flutter: Secondary | ICD-10-CM | POA: Insufficient documentation

## 2022-01-30 DIAGNOSIS — D6859 Other primary thrombophilia: Secondary | ICD-10-CM | POA: Insufficient documentation

## 2022-01-30 DIAGNOSIS — D6869 Other thrombophilia: Secondary | ICD-10-CM

## 2022-01-30 DIAGNOSIS — Z6841 Body Mass Index (BMI) 40.0 and over, adult: Secondary | ICD-10-CM | POA: Insufficient documentation

## 2022-01-30 DIAGNOSIS — E785 Hyperlipidemia, unspecified: Secondary | ICD-10-CM | POA: Insufficient documentation

## 2022-01-30 DIAGNOSIS — I5022 Chronic systolic (congestive) heart failure: Secondary | ICD-10-CM | POA: Diagnosis not present

## 2022-01-30 DIAGNOSIS — Z7901 Long term (current) use of anticoagulants: Secondary | ICD-10-CM | POA: Insufficient documentation

## 2022-01-30 DIAGNOSIS — Z7902 Long term (current) use of antithrombotics/antiplatelets: Secondary | ICD-10-CM | POA: Diagnosis not present

## 2022-01-30 DIAGNOSIS — I4892 Unspecified atrial flutter: Secondary | ICD-10-CM | POA: Diagnosis not present

## 2022-01-30 DIAGNOSIS — I2510 Atherosclerotic heart disease of native coronary artery without angina pectoris: Secondary | ICD-10-CM | POA: Insufficient documentation

## 2022-01-30 DIAGNOSIS — I447 Left bundle-branch block, unspecified: Secondary | ICD-10-CM | POA: Diagnosis not present

## 2022-01-30 DIAGNOSIS — I110 Hypertensive heart disease with heart failure: Secondary | ICD-10-CM | POA: Diagnosis not present

## 2022-01-30 DIAGNOSIS — E669 Obesity, unspecified: Secondary | ICD-10-CM | POA: Insufficient documentation

## 2022-01-30 DIAGNOSIS — I4819 Other persistent atrial fibrillation: Secondary | ICD-10-CM | POA: Diagnosis not present

## 2022-01-30 LAB — CBC
HCT: 40.7 % (ref 39.0–52.0)
Hemoglobin: 13.2 g/dL (ref 13.0–17.0)
MCH: 29.5 pg (ref 26.0–34.0)
MCHC: 32.4 g/dL (ref 30.0–36.0)
MCV: 90.8 fL (ref 80.0–100.0)
Platelets: 234 10*3/uL (ref 150–400)
RBC: 4.48 MIL/uL (ref 4.22–5.81)
RDW: 14.8 % (ref 11.5–15.5)
WBC: 11 10*3/uL — ABNORMAL HIGH (ref 4.0–10.5)
nRBC: 0 % (ref 0.0–0.2)

## 2022-01-30 LAB — BASIC METABOLIC PANEL
Anion gap: 9 (ref 5–15)
BUN: 36 mg/dL — ABNORMAL HIGH (ref 8–23)
CO2: 25 mmol/L (ref 22–32)
Calcium: 8.6 mg/dL — ABNORMAL LOW (ref 8.9–10.3)
Chloride: 101 mmol/L (ref 98–111)
Creatinine, Ser: 1.35 mg/dL — ABNORMAL HIGH (ref 0.61–1.24)
GFR, Estimated: 51 mL/min — ABNORMAL LOW (ref >60)
Glucose, Bld: 93 mg/dL (ref 70–99)
Potassium: 5.1 mmol/L (ref 3.5–5.1)
Sodium: 135 mmol/L (ref 135–145)

## 2022-01-30 MED ORDER — AMIODARONE HCL 200 MG PO TABS: ORAL_TABLET | ORAL | Status: DC

## 2022-01-30 NOTE — H&P (View-Only) (Signed)
Primary Care Physician: Laurey Morale, MD Primary Cardiologist: Dr Angelena Form Primary Electrophysiologist: Dr [name] Referring Physician: Device clinic   Justin [name] is a 88 y.o. male with a history of CAD (CABG 2007), VHD (TAVR 03/04/2017), LBBB, chronic CHF, NSVT,  CRT-D, HTN, HLD, atrial fibrillation who presents for follow up in the [facility]. Patient is on Pradaxa for a CHADS2VASC score of 5. Patient has noted increased fluid retention over the last several weeks with increased dyspnea on exertion. He has had variable results with increasing diuretics. Optivol on 8/23 with normal thoracic impedance. Patient denies orthopnea or PND. His device has shown persistent afib since 04/05/21.   On follow up today, patient is s/p two DCCV on 04/24/21 and 11/08/21. He was started on amiodarone at his visit with Dr [name] on 11/23/21. He has had persistent atrial flutter since 12/17/21 noted on his device. He states that he feels fine today with no awareness of his arrhythmia although he has periodically had to increase diuretics.   Today, he denies symptoms of palpitations, chest pain, orthopnea, PND, dizziness, presyncope, syncope, snoring, daytime somnolence, bleeding, or neurologic sequela. The patient is tolerating medications without difficulties and is otherwise without complaint today.    Atrial Fibrillation Risk Factors:  he does not have symptoms or diagnosis of sleep apnea. he does not have a history of rheumatic fever. he does have a history of alcohol use.   he has a BMI of Body mass index is 41.97 kg/m.[name] Filed Weights   01/30/22 1527  Weight: 121.6 kg     Family History  Problem Relation Age of Onset   Heart attack Father 75   Allergic rhinitis Father    Asthma Father    Heart failure Mother 104   Uterine cancer Mother    Breast cancer Mother    Colon cancer Neg Hx    Esophageal cancer Neg Hx      Atrial Fibrillation Management  history:  Previous antiarrhythmic drugs: amiodarone  Previous cardioversions: 04/24/21, 11/08/21 Previous ablations: none CHADS2VASC score: 5 Anticoagulation history: Pradaxa    Past Medical History:  Diagnosis Date   Age-related macular degeneration, dry, both eyes    Allergic    "24/7; 365 days/year; I'm allergic to pollens, dust, all southern grasses/trees, mold, mildue, cats, dogs" (01/07/2017)   Anal fissure    Asthma    sees [name]    Benign prostatic hypertrophy    (sees Dr. Denman George   CAD (coronary artery disease)    a. s/p CABG 2007. b. Cath 08/2016 - 4/4 patent grafts.   Carotid bruit    carotid u/s 10/10: 0.39% bilaterally   Chronic combined systolic and diastolic CHF (congestive heart failure) (HCC)    Complication of anesthesia 1980s   "w/anal cyst OR, he gave me a saddle block then put a narcotic in spinal cord; had a severe reaction to that" (01/07/2017)   Congestive heart failure (CHF) [facility])    ED (erectile dysfunction)    Family history of adverse reaction to anesthesia    "daughter wakes up during OR" (01/07/2017)   GERD (gastroesophageal reflux disease)    Gout    HTN (hypertension)    Hx of colonic polyps    (sees Dr. Henrene Pastor)   Hyperlipidemia    Hypothyroidism    Moderate to severe aortic stenosis    a. s/p TAVR 02/2017.   Myocardial infarction ([place]) ~ 2000   Obesity    Osteoarthritis    "  was in my knees, hands" (01/07/2017 )   PAF (paroxysmal atrial fibrillation) ([place])    a. documented post TAVR.   Precancerous skin lesion    (sees Dr. Allyson Sabal)   Prostate cancer 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   29 mm Edwards Sapien 3 transcatheter heart valve placed via percutaneous right transfemoral approach   Past Surgical History:  Procedure Laterality Date   ANUS SURGERY     "opened it back up cause it wouldn't heal; wound up w/a fissure" (01/07/2017)   BIV ICD INSERTION CRT-D N/A  05/02/2017   Procedure: BIV ICD INSERTION CRT-D;  Surgeon: Deboraha Sprang, MD;  Location: [place] CV LAB;  Service: Cardiovascular;  Laterality: N/A;   CARDIAC CATHETERIZATION  10/29/2005   CARDIAC CATHETERIZATION N/A 08/27/2016   Procedure: Right/Left Heart Cath and Coronary/Graft Angiography;  Surgeon: Burnell Blanks, MD;  Location: [place] CV LAB;  Service: Cardiovascular;  Laterality: N/A;   CARDIOVERSION N/A 04/24/2021   Procedure: CARDIOVERSION;  Surgeon: Fay Records, MD;  Location: [facility] ENDOSCOPY;  Service: Cardiovascular;  Laterality: N/A;   CARDIOVERSION N/A 11/08/2021   Procedure: CARDIOVERSION;  Surgeon: Pixie Casino, MD;  Location: [facility] ENDOSCOPY;  Service: Cardiovascular;  Laterality: N/A;   CATARACT EXTRACTION W/ INTRAOCULAR LENS  IMPLANT, BILATERAL Bilateral    CATARACT EXTRACTION, BILATERAL  2012   COLONOSCOPY  06/30/2008   no repeats needed    COLONOSCOPY     had 3 or 4 in the past    CORONARY ARTERY BYPASS GRAFT  2007   "CABG X4"   CYST EXCISION PERINEAL  1980s   HAMMER TOE SURGERY Bilateral    JOINT REPLACEMENT     KNEE ARTHROPLASTY  07/30/2011   Procedure: COMPUTER ASSISTED TOTAL KNEE ARTHROPLASTY;  Surgeon: Meredith Pel;  Location: [place];  Service: Orthopedics;  Laterality: Left;  left total knee arthroplasty   MASTECTOMY SUBCUTANEOUS Bilateral    MULTIPLE TOOTH EXTRACTIONS     ORIF FINGER / THUMB FRACTURE Right ~ 1980   "repair of thumb injury"   PROSTATE BIOPSY     REPLACEMENT TOTAL KNEE BILATERAL Bilateral 2012   TEE WITHOUT CARDIOVERSION N/A 03/04/2017   Procedure: TRANSESOPHAGEAL ECHOCARDIOGRAM (TEE);  Surgeon: Burnell Blanks, MD;  Location: [name];  Service: Open Heart Surgery;  Laterality: N/A;   TOTAL KNEE REVISION Right 05/18/2019   Procedure: RIGHT PATELLA REVISION/REMOVAL;  Surgeon: Meredith Pel, MD;  Location: [place];  Service: Orthopedics;  Laterality: Right;   TRANSCATHETER AORTIC VALVE REPLACEMENT, TRANSFEMORAL N/A  03/04/2017   Procedure: TRANSCATHETER AORTIC VALVE REPLACEMENT, TRANSFEMORAL;  Surgeon: Burnell Blanks, MD;  Location: [place];  Service: Open Heart Surgery;  Laterality: N/A;    Current Outpatient Medications  Medication Sig Dispense Refill   allopurinol (ZYLOPRIM) 100 MG tablet Take 1 tablet (100 mg total) by mouth daily. 90 tablet 3   aspirin 81 MG tablet Take 1 tablet (81 mg total) by mouth daily. 30 tablet 0   atorvastatin (LIPITOR) 20 MG tablet TAKE ONE TABLET ONCE DAILY 90 tablet 3   Azelastine HCl 0.15 % SOLN Place 2 sprays into the nose in the morning and at bedtime. 30 mL 11   benzonatate (TESSALON) 200 MG capsule Take 1 capsule (200 mg total) by mouth 2 (two) times daily as needed for cough. 180 capsule 3   colchicine 0.6 MG tablet Take 1 tablet (0.6 mg total) by mouth  every 6 (six) hours as needed (gout). 60 tablet 5   dabigatran (PRADAXA) 150 MG CAPS capsule TAKE (1) CAPSULE TWICE DAILY. 180 capsule 3   diphenhydrAMINE (BENADRYL) 25 MG tablet Take 25 mg by mouth at bedtime.      ENTRESTO 97-103 MG TAKE 1 TABLET BY MOUTH TWICE DAILY. 180 tablet 2   EPINEPHrine 0.3 mg/0.3 mL IJ SOAJ injection Inject 0.3 mLs (0.3 mg total) into the muscle as needed for anaphylaxis. 2 Device 1   famotidine (PEPCID) 20 MG tablet TAKE ONE TABLET BY MOUTH AT BEDTIME. 90 tablet 3   fluorouracil (EFUDEX) 5 % cream Apply 1 application topically 2 (two) times daily as needed (rash).     furosemide (LASIX) 80 MG tablet Take 1 tablet (80 mg total) by mouth daily. 90 tablet 3   HYDROcodone bit-homatropine (HYCODAN) 5-1.5 MG/5ML syrup Take 5 mLs by mouth every 4 (four) hours as needed for cough. 240 mL 0   hydrOXYzine (ATARAX/VISTARIL) 10 MG tablet Take 10 mg by mouth at bedtime.     isosorbide mononitrate (IMDUR) 30 MG 24 hr tablet TAKE ONE TABLET ONCE DAILY 90 tablet 0   ketotifen (ZADITOR) 0.025 % ophthalmic solution Apply 1 drop to eye daily as needed (itchy eyes).      levocetirizine (XYZAL) 5 MG  tablet Take 5 mg by mouth daily.     levothyroxine (SYNTHROID) 100 MCG tablet TAKE ONE TABLET BY MOUTH DAILY. 90 tablet 0   meclizine (ANTIVERT) 25 MG tablet Take 1 tablet (25 mg total) by mouth every 4 (four) hours as needed for dizziness. 60 tablet 2   methocarbamol (ROBAXIN) 500 MG tablet Take 1 tablet (500 mg total) by mouth every 8 (eight) hours as needed for muscle spasms. 30 tablet 0   metoCLOPramide (REGLAN) 10 MG tablet TAKE ONE TABLET BY MOUTH EVERY MORNING WITH BREAKFAST 30 tablet 0   metolazone (ZAROXOLYN) 2.5 MG tablet Take 1 tablet (2.5 mg total) by mouth 2 (two) times a week. Wednesday and Saturday 12 tablet 3   metoprolol succinate (TOPROL-XL) 50 MG 24 hr tablet Take 1.5 tablets (75 mg total) by mouth daily. Take with or immediately following a meal. 135 tablet 1   montelukast (SINGULAIR) 10 MG tablet TAKE 1 TABLET ONCE DAILY IN THE EVENING. 90 tablet 3   Multiple Vitamins-Minerals (PRESERVISION AREDS PO) Take 2 tablets by mouth every morning.     omeprazole (PRILOSEC) 20 MG capsule TAKE (1) CAPSULE TWICE DAILY. 180 capsule 3   oxyCODONE (OXY IR/ROXICODONE) 5 MG immediate release tablet Take 1 tablet (5 mg total) by mouth every 12 (twelve) hours as needed for severe pain. 30 tablet 0   potassium chloride SA (KLOR-CON M20) 20 MEQ tablet Take 1 tablet (20 mEq total) by mouth daily. Take 1 tablet (20 mEq total) by mouth daily. 90 tablet 3   spironolactone (ALDACTONE) 25 MG tablet TAKE ONE TABLET BY MOUTH EVERY DAY. 90 tablet 2   triamcinolone cream (KENALOG) 0.1 % Apply 1 application topically daily as needed for dry skin.     valACYclovir (VALTREX) 500 MG tablet Take 500 mg by mouth 2 (two) times daily as needed (cold sores).     amiodarone (PACERONE) 200 MG tablet Taking one tablet by mouth daily     No current facility-administered medications for this encounter.    Allergies  Allergen Reactions   Peanut-Containing Drug Products Anaphylaxis   Sulfonamide Derivatives Anaphylaxis    Amlodipine Swelling    Swelling in ankles  Eliquis [Apixaban] Other (See Comments)    Back/hip pain   Lisinopril Cough    cough   Xarelto [Rivaroxaban] Other (See Comments)    Back/hip pain    Social History   Socioeconomic History   Marital status: Married    Spouse name: Not on file   Number of children: 3   Years of education: Not on file   Highest education level: Not on file  Occupational History   Occupation: Clinical biochemist    Comment: builds malls  Tobacco Use   Smoking status: Former    Packs/day: 3.50    Years: 13.00    Pack years: 45.50    Types: Cigarettes    Quit date: 1963    Years since quitting: 60.4   Smokeless tobacco: Never   Tobacco comments:    Former smoker 04/19/21  Vaping Use   Vaping Use: Never used  Substance and Sexual Activity   Alcohol use: Not Currently    Alcohol/week: 7.0 standard drinks    Types: 7 Cans of beer per week    Comment: 1 beer daily after 5pm (04/19/21)   Drug use: No   Sexual activity: Never  Other Topics Concern   Not on file  Social History Narrative   FH of CAD, Male 1st degree relative less than age 32.   Social Determinants of Health   Financial Resource Strain: Low Risk    Difficulty of Paying Living Expenses: Not very hard  Food Insecurity: No Food Insecurity   Worried About Charity fundraiser in the Last Year: Never true   Ran Out of Food in the Last Year: Never true  Transportation Needs: No Transportation Needs   Lack of Transportation (Medical): No   Lack of Transportation (Non-Medical): No  Physical Activity: Insufficiently Active   Days of Exercise per Week: 4 days   Minutes of Exercise per Session: 30 min  Stress: No Stress Concern Present   Feeling of Stress : Not at all  Social Connections: Moderately Isolated   Frequency of Communication with Friends and Family: Three times a week   Frequency of Social Gatherings with Friends and Family: Three times a week   Attends Religious  Services: Never   Active Member of Clubs or Organizations: No   Attends Archivist Meetings: Never   Marital Status: Married  Human resources officer Violence: Not At Risk   Fear of Current or Ex-Partner: No   Emotionally Abused: No   Physically Abused: No   Sexually Abused: No     ROS- All systems are reviewed and negative except as per the HPI above.  Physical Exam: Vitals:   01/30/22 1527  BP: (!) 86/46  Pulse: 68  Weight: 121.6 kg  Height: '5\' 7"'$  (1.702 m)     GEN- The patient is a well appearing obese elderly male, alert and oriented x 3 today.   HEENT-head normocephalic, atraumatic, sclera clear, conjunctiva pink, hearing intact, trachea midline. Lungs- Clear to ausculation bilaterally, normal work of breathing Heart- Regular rate and rhythm, no murmurs, rubs or gallops  GI- soft, NT, ND, + BS Extremities- no clubbing, cyanosis, trace-1+ bilateral edema MS- no significant deformity or atrophy Skin- no rash or lesion Psych- euthymic mood, full affect Neuro- strength and sensation are intact   Wt Readings from Last 3 Encounters:  01/30/22 121.6 kg  12/19/21 122 kg  11/23/21 122 kg    EKG today demonstrates  V pacing with underlying atrial flutter Vent. rate 68 BPM  PR interval 216 ms QRS duration 142 ms QT/QTcB 486/516 ms  Echo 09/11/21 demonstrated  1. Left ventricular ejection fraction, by estimation, is 40 to 45%. The  left ventricle has mildly decreased function. The left ventricle  demonstrates global hypokinesis. There is mild concentric left ventricular hypertrophy. Left ventricular diastolic function could not be evaluated.   2. Right ventricular systolic function is normal. The right ventricular  size is normal.   3. Left atrial size was severely dilated.   4. The mitral valve is normal in structure. No evidence of mitral valve  regurgitation. No evidence of mitral stenosis.   5. The aortic valve has been repaired/replaced. Aortic valve   regurgitation is not visualized. No aortic stenosis is present. There is a  29 mm Sapien prosthetic (TAVR) valve present in the aortic position.  Procedure Date: 03/13/2019. Aortic valve mean  gradient measures 10.0 mmHg. Aortic valve Vmax measures 2.12 m/s. Aortic valve acceleration time measures 77 msec.   6. The inferior vena cava is normal in size with greater than 50%  respiratory variability, suggesting right atrial pressure of 3 mmHg.   Comparison(s): No significant change from prior study. Prior images  reviewed side by side.   Epic records are reviewed at length today  CHA2DS2-VASc Score = 5  The patient's score is based upon: CHF History: 1 HTN History: 1 Diabetes History: 0 Stroke History: 0 Vascular Disease History: 1 Age Score: 2 Gender Score: 0      ASSESSMENT AND PLAN: 1. Persistent Atrial Fibrillation/atrial flutter The patient's CHA2DS2-VASc score is 5, indicating a 7.2% annual risk of stroke.   Patient persistently in atrial flutter per device interrogation. Will plan for DCCV now that he has loaded on amiodarone.  Continue Pradaxa 150 mg BID, patient denies any missed doses in the last 3 weeks.  Continue amiodarone 200 mg daily.  Continue Toprol 75 mg daily  2. Secondary Hypercoagulable State (ICD10:  D68.69) The patient is at significant risk for stroke/thromboembolism based upon his CHA2DS2-VASc Score of 5.  Continue Dabigatran (Pradaxa).   3. Obesity Body mass index is 41.97 kg/m. Lifestyle modification was discussed and encouraged including regular physical activity and weight reduction.  4. CAD S/p CABG 2007 No anginal symptoms.   5. Chronic systolic CHF ICM S/p CRT-D, followed by Dr [name] and the device clinic. EF improved to 40-45% on last echo Appears euvolemic today.    6. VHD S/p TAVR  7. HTN Low in office today, patient asymptomatic, has been in normal range at home. Will not make changes at this time.    Follow up with Ermalinda Barrios and Tommye Standard as scheduled.    [facility] [address] [place], [place] [address] [phone] 01/30/2022 4:05 PM

## 2022-01-30 NOTE — Patient Instructions (Signed)
Cardioversion scheduled for Wednesday, June 14th  - Arrive at the [facility] and go to admitting at 12PM  - Do not eat or drink anything after midnight the night prior to your procedure.  - Take all your morning medication (except diabetic medications) with a sip of water prior to arrival.  - You will not be able to drive home after your procedure.  - Do NOT miss any doses of your blood thinner - if you should miss a dose please notify our office immediately.  - If you feel as if you go back into normal rhythm prior to scheduled cardioversion, please notify our office immediately. If your procedure is canceled in the cardioversion suite you will be charged a cancellation fee.

## 2022-01-30 NOTE — Progress Notes (Signed)
EPIC Encounter for ICM Monitoring  Patient Name: Justin [name] is a 88 y.o. male Date: 01/30/2022 Primary Care Physican: Laurey Morale, MD Primary Cardiologist: Angelena Form Electrophysiologist: Vergie Living Pacing: 55.2% Effective 53.8%  (% of Time Since 24-Jan-2022) 11/26/2021 Weight:   267 lbs 12/11/2021 Weight: 267 lbs 12/17/2021 Weight: No current weight 01/08/2022 Weight: 260 lbs   Since 24-Jan-2022 AT/AF 205 Time in AT/AF 23.9 hr/day (99.5%) Longest AT/AF 26 minutes                                           Spoke with patient and heart failure questions reviewed.  Pt denies any fluid symptoms.   Cardioversion scheduled for 6/14    Optivol thoracic impedance suggesting normal fluid levels.     Prescribed: Furosemide 80 mg take 1 tablet every day.  Metolazone 2.5 mg Take 1 tablet by mouth twice a week Wednesday and Saturday (increased on 3/29). KLC 20 mEq Take 1 tablet (20 mEq total) by mouth daily.   Per 4/14 phone note, decrease K+ to 1 tablet with Metolazone Spironolactone 25 mg take 1 tablet daily   Labs:  01/15/2022 Creatinine 1.48, BUN 40, Potassium 5.1, Sodium 135, GFR 46 01/01/2022 Creatinine 1.60, BUN 47, Potassium 4.6, Sodium 132, GFR 42 12/21/2021 Creatinine 1.81, BUN 51, Potassium 5.1, Sodium 135, GFR 36 12/05/2021 Creatinine 1.34, BUN 36, Potassium 5.3, Sodium 136, GFR 52 11/08/2021 Creatinine 1.30, BUN 33, Potassium 4.3, Sodium 138 11/02/2021 Creatinine 1.30, BUN 39, Potassium 4.7, Sodium 136, GFR 54  10/23/2021 Creatinine 1.29, BUN 31, Potassium 5.3, Sodium 134, GFR 54  A complete set of results can be found in Results Review.   Recommendations:  No changes and encouraged to call if experiencing any fluid symptoms.   Follow-up plan: ICM clinic phone appointment on 03/04/2022.   91 day device clinic remote transmission 04/08/2022.     EP/Cardiology Office Visits:  02/19/2022 with with Ermalinda Barrios, PA.  02/22/2022 with Tommye Standard, PA.   Copy of ICM check sent to  Dr. [name].   3 month ICM trend: 01/28/2022.    12-14 Month ICM trend:     [name], RN 01/30/2022 5:02 PM

## 2022-01-30 NOTE — Progress Notes (Signed)
Primary Care Physician: Laurey Morale, MD Primary Cardiologist: Dr Angelena Form Primary Electrophysiologist: Dr [name] Referring Physician: Device clinic   Justin [name] is a 88 y.o. male with a history of CAD (CABG 2007), VHD (TAVR 03/04/2017), LBBB, chronic CHF, NSVT,  CRT-D, HTN, HLD, atrial fibrillation who presents for follow up in [facility]. Patient is on Pradaxa for a CHADS2VASC score of 5. Patient has noted increased fluid retention over the last several weeks with increased dyspnea on exertion. He has had variable results with increasing diuretics. Optivol on 8/23 with normal thoracic impedance. Patient denies orthopnea or PND. His device has shown persistent afib since 04/05/21.   On follow up today, patient is s/p two DCCV on 04/24/21 and 11/08/21. He was started on amiodarone at his visit with Dr [name] on 11/23/21. He has had persistent atrial flutter since 12/17/21 noted on his device. He states that he feels fine today with no awareness of his arrhythmia although he has periodically had to increase diuretics.   Today, he denies symptoms of palpitations, chest pain, orthopnea, PND, dizziness, presyncope, syncope, snoring, daytime somnolence, bleeding, or neurologic sequela. The patient is tolerating medications without difficulties and is otherwise without complaint today.    Atrial Fibrillation Risk Factors:  he does not have symptoms or diagnosis of sleep apnea. he does not have a history of rheumatic fever. he does have a history of alcohol use.   he has a BMI of Body mass index is 41.97 kg/m.[name] Filed Weights   01/30/22 1527  Weight: 121.6 kg     Family History  Problem Relation Age of Onset   Heart attack Father 69   Allergic rhinitis Father    Asthma Father    Heart failure Mother 2   Uterine cancer Mother    Breast cancer Mother    Colon cancer Neg Hx    Esophageal cancer Neg Hx      Atrial Fibrillation Management  history:  Previous antiarrhythmic drugs: amiodarone  Previous cardioversions: 04/24/21, 11/08/21 Previous ablations: none CHADS2VASC score: 5 Anticoagulation history: Pradaxa    Past Medical History:  Diagnosis Date   Age-related macular degeneration, dry, both eyes    Allergic    "24/7; 365 days/year; I'm allergic to pollens, dust, all southern grasses/trees, mold, mildue, cats, dogs" (01/07/2017)   Anal fissure    Asthma    sees [name]    Benign prostatic hypertrophy    (sees Dr. Denman George   CAD (coronary artery disease)    a. s/p CABG 2007. b. Cath 08/2016 - 4/4 patent grafts.   Carotid bruit    carotid u/s 10/10: 0.39% bilaterally   Chronic combined systolic and diastolic CHF (congestive heart failure) (HCC)    Complication of anesthesia 1980s   "w/anal cyst OR, he gave me a saddle block then put a narcotic in spinal cord; had a severe reaction to that" (01/07/2017)   Congestive heart failure (CHF) [facility])    ED (erectile dysfunction)    Family history of adverse reaction to anesthesia    "daughter wakes up during OR" (01/07/2017)   GERD (gastroesophageal reflux disease)    Gout    HTN (hypertension)    Hx of colonic polyps    (sees Dr. Henrene Pastor)   Hyperlipidemia    Hypothyroidism    Moderate to severe aortic stenosis    a. s/p TAVR 02/2017.   Myocardial infarction ([place]) ~ 2000   Obesity    Osteoarthritis    "  was in my knees, hands" (01/07/2017 )   PAF (paroxysmal atrial fibrillation) ([place])    a. documented post TAVR.   Precancerous skin lesion    (sees Dr. 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   29 mm Edwards Sapien 3 transcatheter heart valve placed via percutaneous right transfemoral approach   Past Surgical History:  Procedure Laterality Date   ANUS SURGERY     "opened it back up cause it wouldn't heal; wound up w/a fissure" (01/07/2017)   BIV ICD INSERTION CRT-D N/A  05/02/2017   Procedure: BIV ICD INSERTION CRT-D;  Surgeon: Deboraha Sprang, MD;  Location: [place] CV LAB;  Service: Cardiovascular;  Laterality: N/A;   CARDIAC CATHETERIZATION  10/29/2005   CARDIAC CATHETERIZATION N/A 08/27/2016   Procedure: Right/Left Heart Cath and Coronary/Graft Angiography;  Surgeon: Burnell Blanks, MD;  Location: [place] CV LAB;  Service: Cardiovascular;  Laterality: N/A;   CARDIOVERSION N/A 04/24/2021   Procedure: CARDIOVERSION;  Surgeon: Fay Records, MD;  Location: [facility] ENDOSCOPY;  Service: Cardiovascular;  Laterality: N/A;   CARDIOVERSION N/A 11/08/2021   Procedure: CARDIOVERSION;  Surgeon: Pixie Casino, MD;  Location: [facility] ENDOSCOPY;  Service: Cardiovascular;  Laterality: N/A;   CATARACT EXTRACTION W/ INTRAOCULAR LENS  IMPLANT, BILATERAL Bilateral    CATARACT EXTRACTION, BILATERAL  2012   COLONOSCOPY  06/30/2008   no repeats needed    COLONOSCOPY     had 3 or 4 in the past    CORONARY ARTERY BYPASS GRAFT  2007   "CABG X4"   CYST EXCISION PERINEAL  1980s   HAMMER TOE SURGERY Bilateral    JOINT REPLACEMENT     KNEE ARTHROPLASTY  07/30/2011   Procedure: COMPUTER ASSISTED TOTAL KNEE ARTHROPLASTY;  Surgeon: Meredith Pel;  Location: [place];  Service: Orthopedics;  Laterality: Left;  left total knee arthroplasty   MASTECTOMY SUBCUTANEOUS Bilateral    MULTIPLE TOOTH EXTRACTIONS     ORIF FINGER / THUMB FRACTURE Right ~ 1980   "repair of thumb injury"   PROSTATE BIOPSY     REPLACEMENT TOTAL KNEE BILATERAL Bilateral 2012   TEE WITHOUT CARDIOVERSION N/A 03/04/2017   Procedure: TRANSESOPHAGEAL ECHOCARDIOGRAM (TEE);  Surgeon: Burnell Blanks, MD;  Location: [place];  Service: Open Heart Surgery;  Laterality: N/A;   TOTAL KNEE REVISION Right 05/18/2019   Procedure: RIGHT PATELLA REVISION/REMOVAL;  Surgeon: Meredith Pel, MD;  Location: [place];  Service: Orthopedics;  Laterality: Right;   TRANSCATHETER AORTIC VALVE REPLACEMENT, TRANSFEMORAL N/A  03/04/2017   Procedure: TRANSCATHETER AORTIC VALVE REPLACEMENT, TRANSFEMORAL;  Surgeon: Burnell Blanks, MD;  Location: [place];  Service: Open Heart Surgery;  Laterality: N/A;    Current Outpatient Medications  Medication Sig Dispense Refill   allopurinol (ZYLOPRIM) 100 MG tablet Take 1 tablet (100 mg total) by mouth daily. 90 tablet 3   aspirin 81 MG tablet Take 1 tablet (81 mg total) by mouth daily. 30 tablet 0   atorvastatin (LIPITOR) 20 MG tablet TAKE ONE TABLET ONCE DAILY 90 tablet 3   Azelastine HCl 0.15 % SOLN Place 2 sprays into the nose in the morning and at bedtime. 30 mL 11   benzonatate (TESSALON) 200 MG capsule Take 1 capsule (200 mg total) by mouth 2 (two) times daily as needed for cough. 180 capsule 3   colchicine 0.6 MG tablet Take 1 tablet (0.6 mg total) by mouth  every 6 (six) hours as needed (gout). 60 tablet 5   dabigatran (PRADAXA) 150 MG CAPS capsule TAKE (1) CAPSULE TWICE DAILY. 180 capsule 3   diphenhydrAMINE (BENADRYL) 25 MG tablet Take 25 mg by mouth at bedtime.      ENTRESTO 97-103 MG TAKE 1 TABLET BY MOUTH TWICE DAILY. 180 tablet 2   EPINEPHrine 0.3 mg/0.3 mL IJ SOAJ injection Inject 0.3 mLs (0.3 mg total) into the muscle as needed for anaphylaxis. 2 Device 1   famotidine (PEPCID) 20 MG tablet TAKE ONE TABLET BY MOUTH AT BEDTIME. 90 tablet 3   fluorouracil (EFUDEX) 5 % cream Apply 1 application topically 2 (two) times daily as needed (rash).     furosemide (LASIX) 80 MG tablet Take 1 tablet (80 mg total) by mouth daily. 90 tablet 3   HYDROcodone bit-homatropine (HYCODAN) 5-1.5 MG/5ML syrup Take 5 mLs by mouth every 4 (four) hours as needed for cough. 240 mL 0   hydrOXYzine (ATARAX/VISTARIL) 10 MG tablet Take 10 mg by mouth at bedtime.     isosorbide mononitrate (IMDUR) 30 MG 24 hr tablet TAKE ONE TABLET ONCE DAILY 90 tablet 0   ketotifen (ZADITOR) 0.025 % ophthalmic solution Apply 1 drop to eye daily as needed (itchy eyes).      levocetirizine (XYZAL) 5 MG  tablet Take 5 mg by mouth daily.     levothyroxine (SYNTHROID) 100 MCG tablet TAKE ONE TABLET BY MOUTH DAILY. 90 tablet 0   meclizine (ANTIVERT) 25 MG tablet Take 1 tablet (25 mg total) by mouth every 4 (four) hours as needed for dizziness. 60 tablet 2   methocarbamol (ROBAXIN) 500 MG tablet Take 1 tablet (500 mg total) by mouth every 8 (eight) hours as needed for muscle spasms. 30 tablet 0   metoCLOPramide (REGLAN) 10 MG tablet TAKE ONE TABLET BY MOUTH EVERY MORNING WITH BREAKFAST 30 tablet 0   metolazone (ZAROXOLYN) 2.5 MG tablet Take 1 tablet (2.5 mg total) by mouth 2 (two) times a week. Wednesday and Saturday 12 tablet 3   metoprolol succinate (TOPROL-XL) 50 MG 24 hr tablet Take 1.5 tablets (75 mg total) by mouth daily. Take with or immediately following a meal. 135 tablet 1   montelukast (SINGULAIR) 10 MG tablet TAKE 1 TABLET ONCE DAILY IN THE EVENING. 90 tablet 3   Multiple Vitamins-Minerals (PRESERVISION AREDS PO) Take 2 tablets by mouth every morning.     omeprazole (PRILOSEC) 20 MG capsule TAKE (1) CAPSULE TWICE DAILY. 180 capsule 3   oxyCODONE (OXY IR/ROXICODONE) 5 MG immediate release tablet Take 1 tablet (5 mg total) by mouth every 12 (twelve) hours as needed for severe pain. 30 tablet 0   potassium chloride SA (KLOR-CON M20) 20 MEQ tablet Take 1 tablet (20 mEq total) by mouth daily. Take 1 tablet (20 mEq total) by mouth daily. 90 tablet 3   spironolactone (ALDACTONE) 25 MG tablet TAKE ONE TABLET BY MOUTH EVERY DAY. 90 tablet 2   triamcinolone cream (KENALOG) 0.1 % Apply 1 application topically daily as needed for dry skin.     valACYclovir (VALTREX) 500 MG tablet Take 500 mg by mouth 2 (two) times daily as needed (cold sores).     amiodarone (PACERONE) 200 MG tablet Taking one tablet by mouth daily     No current facility-administered medications for this encounter.    Allergies  Allergen Reactions   Peanut-Containing Drug Products Anaphylaxis   Sulfonamide Derivatives Anaphylaxis    Amlodipine Swelling    Swelling in ankles  Eliquis [Apixaban] Other (See Comments)    Back/hip pain   Lisinopril Cough    cough   Xarelto [Rivaroxaban] Other (See Comments)    Back/hip pain    Social History   Socioeconomic History   Marital status: Married    Spouse name: Not on file   Number of children: 3   Years of education: Not on file   Highest education level: Not on file  Occupational History   Occupation: Clinical biochemist    Comment: builds malls  Tobacco Use   Smoking status: Former    Packs/day: 3.50    Years: 13.00    Pack years: 45.50    Types: Cigarettes    Quit date: 1963    Years since quitting: 60.4   Smokeless tobacco: Never   Tobacco comments:    Former smoker 04/19/21  Vaping Use   Vaping Use: Never used  Substance and Sexual Activity   Alcohol use: Not Currently    Alcohol/week: 7.0 standard drinks    Types: 7 Cans of beer per week    Comment: 1 beer daily after 5pm (04/19/21)   Drug use: No   Sexual activity: Never  Other Topics Concern   Not on file  Social History Narrative   FH of CAD, Male 1st degree relative less than age 26.   Social Determinants of Health   Financial Resource Strain: Low Risk    Difficulty of Paying Living Expenses: Not very hard  Food Insecurity: No Food Insecurity   Worried About Charity fundraiser in the Last Year: Never true   Ran Out of Food in the Last Year: Never true  Transportation Needs: No Transportation Needs   Lack of Transportation (Medical): No   Lack of Transportation (Non-Medical): No  Physical Activity: Insufficiently Active   Days of Exercise per Week: 4 days   Minutes of Exercise per Session: 30 min  Stress: No Stress Concern Present   Feeling of Stress : Not at all  Social Connections: Moderately Isolated   Frequency of Communication with Friends and Family: Three times a week   Frequency of Social Gatherings with Friends and Family: Three times a week   Attends Religious  Services: Never   Active Member of Clubs or Organizations: No   Attends Archivist Meetings: Never   Marital Status: Married  Human resources officer Violence: Not At Risk   Fear of Current or Ex-Partner: No   Emotionally Abused: No   Physically Abused: No   Sexually Abused: No     ROS- All systems are reviewed and negative except as per the HPI above.  Physical Exam: Vitals:   01/30/22 1527  BP: (!) 86/46  Pulse: 68  Weight: 121.6 kg  Height: '5\' 7"'$  (1.702 m)     GEN- The patient is a well appearing obese elderly male, alert and oriented x 3 today.   HEENT-head normocephalic, atraumatic, sclera clear, conjunctiva pink, hearing intact, trachea midline. Lungs- Clear to ausculation bilaterally, normal work of breathing Heart- Regular rate and rhythm, no murmurs, rubs or gallops  GI- soft, NT, ND, + BS Extremities- no clubbing, cyanosis, trace-1+ bilateral edema MS- no significant deformity or atrophy Skin- no rash or lesion Psych- euthymic mood, full affect Neuro- strength and sensation are intact   Wt Readings from Last 3 Encounters:  01/30/22 121.6 kg  12/19/21 122 kg  11/23/21 122 kg    EKG today demonstrates  V pacing with underlying atrial flutter Vent. rate 68 BPM  PR interval 216 ms QRS duration 142 ms QT/QTcB 486/516 ms  Echo 09/11/21 demonstrated  1. Left ventricular ejection fraction, by estimation, is 40 to 45%. The  left ventricle has mildly decreased function. The left ventricle  demonstrates global hypokinesis. There is mild concentric left ventricular hypertrophy. Left ventricular diastolic function could not be evaluated.   2. Right ventricular systolic function is normal. The right ventricular  size is normal.   3. Left atrial size was severely dilated.   4. The mitral valve is normal in structure. No evidence of mitral valve  regurgitation. No evidence of mitral stenosis.   5. The aortic valve has been repaired/replaced. Aortic valve   regurgitation is not visualized. No aortic stenosis is present. There is a  29 mm Sapien prosthetic (TAVR) valve present in the aortic position.  Procedure Date: 03/13/2019. Aortic valve mean  gradient measures 10.0 mmHg. Aortic valve Vmax measures 2.12 m/s. Aortic valve acceleration time measures 77 msec.   6. The inferior vena cava is normal in size with greater than 50%  respiratory variability, suggesting right atrial pressure of 3 mmHg.   Comparison(s): No significant change from prior study. Prior images  reviewed side by side.   Epic records are reviewed at length today  CHA2DS2-VASc Score = 5  The patient's score is based upon: CHF History: 1 HTN History: 1 Diabetes History: 0 Stroke History: 0 Vascular Disease History: 1 Age Score: 2 Gender Score: 0      ASSESSMENT AND PLAN: 1. Persistent Atrial Fibrillation/atrial flutter The patient's CHA2DS2-VASc score is 5, indicating a 7.2% annual risk of stroke.   Patient persistently in atrial flutter per device interrogation. Will plan for DCCV now that he has loaded on amiodarone.  Continue Pradaxa 150 mg BID, patient denies any missed doses in the last 3 weeks.  Continue amiodarone 200 mg daily.  Continue Toprol 75 mg daily  2. Secondary Hypercoagulable State (ICD10:  D68.69) The patient is at significant risk for stroke/thromboembolism based upon his CHA2DS2-VASc Score of 5.  Continue Dabigatran (Pradaxa).   3. Obesity Body mass index is 41.97 kg/m. Lifestyle modification was discussed and encouraged including regular physical activity and weight reduction.  4. CAD S/p CABG 2007 No anginal symptoms.   5. Chronic systolic CHF ICM S/p CRT-D, followed by Dr [name] and the device clinic. EF improved to 40-45% on last echo Appears euvolemic today.    6. VHD S/p TAVR  7. HTN Low in office today, patient asymptomatic, has been in normal range at home. Will not make changes at this time.    Follow up with Ermalinda Barrios and Tommye Standard as scheduled.    [facility] [address] [place], [place] [address] [phone] 01/30/2022 4:05 PM

## 2022-02-01 ENCOUNTER — Other Ambulatory Visit: Payer: Self-pay | Admitting: Cardiovascular Disease

## 2022-02-01 ENCOUNTER — Encounter (HOSPITAL_COMMUNITY): Payer: Self-pay | Admitting: Cardiovascular Disease

## 2022-02-01 ENCOUNTER — Other Ambulatory Visit: Payer: Self-pay | Admitting: Family Medicine

## 2022-02-06 ENCOUNTER — Other Ambulatory Visit: Payer: Self-pay

## 2022-02-06 ENCOUNTER — Encounter (HOSPITAL_COMMUNITY)
Admission: RE | Disposition: A | Discharge status: Home / Self Care | Payer: Self-pay | Source: Home / Self Care | Attending: Cardiovascular Disease

## 2022-02-06 ENCOUNTER — Encounter (HOSPITAL_COMMUNITY): Payer: Self-pay | Admitting: Cardiovascular Disease

## 2022-02-06 ENCOUNTER — Ambulatory Visit (HOSPITAL_COMMUNITY): Payer: Medicare Other | Admitting: Certified Registered"

## 2022-02-06 ENCOUNTER — Ambulatory Visit (HOSPITAL_COMMUNITY)
Admission: RE | Admit: 2022-02-06 | Discharge: 2022-02-06 | Disposition: A | Discharge status: Home / Self Care | Payer: Medicare Other | Attending: Cardiovascular Disease | Admitting: Cardiovascular Disease

## 2022-02-06 ENCOUNTER — Ambulatory Visit (HOSPITAL_BASED_OUTPATIENT_CLINIC_OR_DEPARTMENT_OTHER): Payer: Medicare Other | Admitting: Certified Registered"

## 2022-02-06 DIAGNOSIS — Z95810 Presence of automatic (implantable) cardiac defibrillator: Secondary | ICD-10-CM | POA: Insufficient documentation

## 2022-02-06 DIAGNOSIS — I4891 Unspecified atrial fibrillation: Secondary | ICD-10-CM | POA: Diagnosis not present

## 2022-02-06 DIAGNOSIS — Z79899 Other long term (current) drug therapy: Secondary | ICD-10-CM | POA: Insufficient documentation

## 2022-02-06 DIAGNOSIS — Z6841 Body Mass Index (BMI) 40.0 and over, adult: Secondary | ICD-10-CM | POA: Diagnosis not present

## 2022-02-06 DIAGNOSIS — I4892 Unspecified atrial flutter: Secondary | ICD-10-CM | POA: Insufficient documentation

## 2022-02-06 DIAGNOSIS — I4819 Other persistent atrial fibrillation: Secondary | ICD-10-CM | POA: Diagnosis not present

## 2022-02-06 DIAGNOSIS — Z7901 Long term (current) use of anticoagulants: Secondary | ICD-10-CM | POA: Insufficient documentation

## 2022-02-06 DIAGNOSIS — I110 Hypertensive heart disease with heart failure: Secondary | ICD-10-CM | POA: Insufficient documentation

## 2022-02-06 DIAGNOSIS — E669 Obesity, unspecified: Secondary | ICD-10-CM | POA: Insufficient documentation

## 2022-02-06 DIAGNOSIS — R0609 Other forms of dyspnea: Secondary | ICD-10-CM | POA: Insufficient documentation

## 2022-02-06 DIAGNOSIS — Z87891 Personal history of nicotine dependence: Secondary | ICD-10-CM | POA: Insufficient documentation

## 2022-02-06 DIAGNOSIS — I255 Ischemic cardiomyopathy: Secondary | ICD-10-CM | POA: Insufficient documentation

## 2022-02-06 DIAGNOSIS — I5022 Chronic systolic (congestive) heart failure: Secondary | ICD-10-CM | POA: Diagnosis not present

## 2022-02-06 DIAGNOSIS — I252 Old myocardial infarction: Secondary | ICD-10-CM | POA: Diagnosis not present

## 2022-02-06 DIAGNOSIS — I509 Heart failure, unspecified: Secondary | ICD-10-CM | POA: Diagnosis not present

## 2022-02-06 DIAGNOSIS — Z952 Presence of prosthetic heart valve: Secondary | ICD-10-CM | POA: Insufficient documentation

## 2022-02-06 DIAGNOSIS — Z951 Presence of aortocoronary bypass graft: Secondary | ICD-10-CM | POA: Diagnosis not present

## 2022-02-06 DIAGNOSIS — I447 Left bundle-branch block, unspecified: Secondary | ICD-10-CM | POA: Insufficient documentation

## 2022-02-06 DIAGNOSIS — I2510 Atherosclerotic heart disease of native coronary artery without angina pectoris: Secondary | ICD-10-CM

## 2022-02-06 DIAGNOSIS — D6869 Other thrombophilia: Secondary | ICD-10-CM | POA: Diagnosis not present

## 2022-02-06 DIAGNOSIS — E785 Hyperlipidemia, unspecified: Secondary | ICD-10-CM | POA: Diagnosis not present

## 2022-02-06 HISTORY — PX: CARDIOVERSION: SHX1299

## 2022-02-06 SURGERY — CARDIOVERSION
Anesthesia: General

## 2022-02-06 MED ORDER — PHENYLEPHRINE HCL (PRESSORS) 10 MG/ML IV SOLN
INTRAVENOUS | Status: DC | PRN
  Administered 2022-02-06: 80 ug via INTRAVENOUS

## 2022-02-06 MED ORDER — PROPOFOL 10 MG/ML IV BOLUS
INTRAVENOUS | Status: DC | PRN
  Administered 2022-02-06: 30 mg via INTRAVENOUS
  Administered 2022-02-06: 40 mg via INTRAVENOUS

## 2022-02-06 MED ORDER — EPHEDRINE SULFATE (PRESSORS) 50 MG/ML IJ SOLN
INTRAMUSCULAR | Status: DC | PRN
  Administered 2022-02-06: 5 mg via INTRAVENOUS

## 2022-02-06 MED ORDER — SODIUM CHLORIDE 0.9 % IV SOLN: INTRAVENOUS | Status: DC

## 2022-02-06 NOTE — Transfer of Care (Signed)
Immediate Anesthesia Transfer of Care Note  Patient: Justin [name]  Procedure(s) Performed: CARDIOVERSION  Patient Location: Endoscopy Unit  Anesthesia Type:General  Level of Consciousness: awake, alert  and oriented  Airway & Oxygen Therapy: Patient Spontanous Breathing  Post-op Assessment: Report given to RN, Post -op Vital signs reviewed and stable and Patient moving all extremities X 4  Post vital signs: Reviewed and stable  Last Vitals:  Vitals Value Taken Time  BP    Temp    Pulse    Resp    SpO2      Last Pain:  Vitals:   02/06/22 1100  TempSrc: Temporal  PainSc: 0-No pain         Complications: No notable events documented.

## 2022-02-06 NOTE — Anesthesia Preprocedure Evaluation (Signed)
Anesthesia Evaluation  Patient identified by MRN, date of birth, ID band Patient awake    Reviewed: Allergy & Precautions, NPO status , Patient's Chart, lab work & pertinent test results  History of Anesthesia Complications Negative for: history of anesthetic complications  Airway Mallampati: III  TM Distance: >3 FB Neck ROM: Full    Dental  (+) Teeth Intact, Dental Advisory Given   Pulmonary asthma , former smoker,    breath sounds clear to auscultation       Cardiovascular hypertension, + CAD, + Past MI, + CABG and +CHF  + dysrhythmias Atrial Fibrillation + pacemaker + Valvular Problems/Murmurs  Rhythm:Irregular  1/23: 1. Left ventricular ejection fraction, by estimation, is 40 to 45%. The  left ventricle has mildly decreased function. The left ventricle  demonstrates global hypokinesis. There is mild concentric left ventricular  hypertrophy. Left ventricular diastolic  function could not be evaluated.  2. Right ventricular systolic function is normal. The right ventricular  size is normal.  3. Left atrial size was severely dilated.  4. The mitral valve is normal in structure. No evidence of mitral valve  regurgitation. No evidence of mitral stenosis.  5. The aortic valve has been repaired/replaced. Aortic valve  regurgitation is not visualized. No aortic stenosis is present. There is a  29 mm Sapien prosthetic (TAVR) valve present in the aortic position.  Procedure Date: 03/13/2019. Aortic valve mean  gradient measures 10.0 mmHg. Aortic valve Vmax measures 2.12 m/s. Aortic  valve acceleration time measures 77 msec.  6. The inferior vena cava is normal in size with greater than 50%  respiratory variability, suggesting right atrial pressure of 3 mmHg.    ? There is moderate aortic valve stenosis. ? SVG graft was visualized by angiography and is normal in caliber. ? 1st Mrg lesion, 65 %stenosed. ? Ost LM to LM lesion,  100 %stenosed. ? SVG graft was visualized by angiography and is normal in caliber. ? LIMA graft was visualized by angiography and is normal in caliber. ? Prox RCA to Mid RCA lesion, 100 %stenosed. ? SVG graft was visualized by angiography and is normal in caliber and anatomically normal. ? Hemodynamic findings consistent with mild pulmonary hypertension.   1. 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  2. The Left main is occluded at the ostium.  3. The LAD fills from the patent IMA graft and the Diagonal fills from the patent vein graft 4. The Circumflex fills from the patent vein graft to the OM. There is a moderate stenosis in the proximal segment of the OM branch proximal to the insertion of the vein graft.  5. The distal RCA fills from the patent vein graft.  6. Moderate AS 7. Elevated filling pressures  Recommendations: Will continue medical management of CAD. Will start Lasix 20 mg daily for mild volume overload. No indication for AVR at this time.      Neuro/Psych negative neurological ROS  negative psych ROS   GI/Hepatic Neg liver ROS, GERD  ,  Endo/Other  Hypothyroidism   Renal/GU negative Renal ROS     Musculoskeletal  (+) Arthritis ,   Abdominal   Peds  Hematology Lab Results      Component                Value               Date  WBC                      11.0 (H)            01/30/2022                HGB                      13.2                01/30/2022                HCT                      40.7                01/30/2022                MCV                      90.8                01/30/2022                PLT                      234                 01/30/2022              Anesthesia Other Findings   Reproductive/Obstetrics                             Anesthesia Physical Anesthesia Plan  ASA: 3  Anesthesia Plan: General   Post-op  Pain Management:    Induction:   PONV Risk Score and Plan: 2 and Treatment may vary due to age or medical condition  Airway Management Planned: Mask  Additional Equipment: None  Intra-op Plan:   Post-operative Plan:   Informed Consent: I have reviewed the patients History and Physical, chart, labs and discussed the procedure including the risks, benefits and alternatives for the proposed anesthesia with the patient or authorized representative who has indicated his/her understanding and acceptance.     Dental advisory given  Plan Discussed with: CRNA  Anesthesia Plan Comments:         Anesthesia Quick Evaluation

## 2022-02-06 NOTE — Anesthesia Postprocedure Evaluation (Signed)
Anesthesia Post Note  Patient: Justin [name]  Procedure(s) Performed: CARDIOVERSION     Patient location during evaluation: Endoscopy Anesthesia Type: General Level of consciousness: awake and alert Pain management: pain level controlled Vital Signs Assessment: post-procedure vital signs reviewed and stable Respiratory status: spontaneous breathing, nonlabored ventilation, respiratory function stable and patient connected to nasal cannula oxygen Cardiovascular status: blood pressure returned to baseline and stable Postop Assessment: no apparent nausea or vomiting Anesthetic complications: no   No notable events documented.  Last Vitals:  Vitals:   02/06/22 1100 02/06/22 1159  BP: (!) 83/37 (!) 78/32  Pulse: 83 66  Resp: 11 12  Temp: (!) 36.3 C   SpO2: 98% 92%    Last Pain:  Vitals:   02/06/22 1159  TempSrc:   PainSc: 0-No pain                 Joeanthony Seeling

## 2022-02-06 NOTE — Discharge Instructions (Signed)

## 2022-02-06 NOTE — CV Procedure (Signed)
DCC: Patient hypotensive. Review of notes show hypotensive in office and with last [facility] Given 250 ccm saline bolus and drip 120 cc/hr before procedure   Anesthesia: Propofol DCC x 1 converted from afib/flutter with V pacing to NSR with AV pacing and ventricular ectopy  On Rx anticoagulation with no missed doses No immediate neurologic sequelae  Jenkins Rouge MD [facility]

## 2022-02-06 NOTE — Interval H&P Note (Signed)
History and Physical Interval Note:  02/06/2022 12:11 PM  Justin [name]  has presented today for surgery, with the diagnosis of AFIB.  The various methods of treatment have been discussed with the patient and family. After consideration of risks, benefits and other options for treatment, the patient has consented to  Procedure(s): CARDIOVERSION (N/A) as a surgical intervention.  The patient's history has been reviewed, patient examined, no change in status, stable for surgery.  I have reviewed the patient's chart and labs.  Questions were answered to the patient's satisfaction.     Jenkins Rouge

## 2022-02-07 ENCOUNTER — Encounter (HOSPITAL_COMMUNITY): Payer: Self-pay | Admitting: Cardiovascular Disease

## 2022-02-07 NOTE — Progress Notes (Unsigned)
Cardiology Office Note    Date:  02/07/2022   ID:  Justin, [name] Feb 13, 1934, MRN [id number]   PCP:  Laurey Morale, MD   [place]  Cardiologist:  [name], MD *** Advanced Practice Provider:  No care team member to display Electrophysiologist:  Virl Axe, MD   [phone]   No chief complaint on file.   History of Present Illness:  Justin [name] is a 88 y.o. male with a hx of hypertension, hyperlipidemia, severe aortic stenosis status post TAVR, paroxysmal atrial fibrillation, chronic diastolic and systolic CHF, coronary artery disease status post CABG x4, syncope, junctional bradycardia, PVCs, nonsustained VT, LBBB, and ICD implantation.   He underwent cardiac catheterization 1/18 which showed all 4 of his grafts to be patent.  CRT ICD 9/18 implanted because of syncope  and new left bundle branch block following TAVR 02/2017 ; hx of Afib on dabigitran and ASA;  recurrent Afib with DCCV 8/22      Patient saw Dr. [name] 08/23/21 and was fluid overloaded and increased metolazone 2.5 mg twice a week and and ordered echo to help guide antiarrhythmic therapy for VT. Echo 09/11/21 improved LVEF 40-45%.   OptiVol thoracic impedance 10/09/2021 suggest possible dehydration starting 09/25/2021.  BiV pacing decreased from 70% effective to 64% and atrial tach A-fib burden increased from 5% to 99%.  He was advised to stay hydrated and drink 48 to 50 ounces daily.  I saw patient 10/23/21 with chronic DOE unchanged but Dr. [name] recommend repeat DCCV which was performed 11/08/21(3 shocks). I also decreased metolazone 2.5 mg to once daily but patient stopped completely and then developed swelling. Asked to take weekly.  Patient was then started on amiodarone for recurrent Aflutter since 12/17/21.Patient underwent repeat [facility] x1 02/06/22.    Past Medical History:  Diagnosis Date   Age-related macular degeneration, dry, both eyes    Allergic    "24/7;  365 days/year; I'm allergic to pollens, dust, all southern grasses/trees, mold, mildue, cats, dogs" (01/07/2017)   Anal fissure    Asthma    sees [name]    Benign prostatic hypertrophy    (sees Dr. Denman George   CAD (coronary artery disease)    a. s/p CABG 2007. b. Cath 08/2016 - 4/4 patent grafts.   Carotid bruit    carotid u/s 10/10: 0.39% bilaterally   Chronic combined systolic and diastolic CHF (congestive heart failure) (HCC)    Complication of anesthesia 1980s   "w/anal cyst OR, he gave me a saddle block then put a narcotic in spinal cord; had a severe reaction to that" (01/07/2017)   Congestive heart failure (CHF) [facility])    ED (erectile dysfunction)    Family history of adverse reaction to anesthesia    "daughter wakes up during OR" (01/07/2017)   GERD (gastroesophageal reflux disease)    Gout    HTN (hypertension)    Hx of colonic polyps    (sees Dr. Henrene Pastor)   Hyperlipidemia    Hypothyroidism    Moderate to severe aortic stenosis    a. s/p TAVR 02/2017.   Myocardial infarction [facility]) ~ 2000   Obesity    Osteoarthritis    "was in my knees, hands" (01/07/2017 )   PAF (paroxysmal atrial fibrillation) ([facility])    a. documented post TAVR.   Precancerous skin lesion    (sees Dr. Allyson Sabal)   Prostate cancer [facility]) dx'd ~ 2014   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   29 mm Edwards Sapien 3 transcatheter heart valve placed via percutaneous right transfemoral approach    Past Surgical History:  Procedure Laterality Date   ANUS SURGERY     "opened it back up cause it wouldn't heal; wound up w/a fissure" (01/07/2017)   BIV ICD INSERTION CRT-D N/A 05/02/2017   Procedure: BIV ICD INSERTION CRT-D;  Surgeon: Deboraha Sprang, MD;  Location: [place] CV LAB;  Service: Cardiovascular;  Laterality: N/A;   CARDIAC CATHETERIZATION  10/29/2005   CARDIAC CATHETERIZATION N/A 08/27/2016   Procedure: Right/Left Heart Cath and Coronary/Graft Angiography;   Surgeon: Burnell Blanks, MD;  Location: [place] CV LAB;  Service: Cardiovascular;  Laterality: N/A;   CARDIOVERSION N/A 04/24/2021   Procedure: CARDIOVERSION;  Surgeon: Fay Records, MD;  Location: [place];  Service: Cardiovascular;  Laterality: N/A;   CARDIOVERSION N/A 11/08/2021   Procedure: CARDIOVERSION;  Surgeon: Pixie Casino, MD;  Location: [facility] ENDOSCOPY;  Service: Cardiovascular;  Laterality: N/A;   CARDIOVERSION N/A 02/06/2022   Procedure: CARDIOVERSION;  Surgeon: Josue Hector, MD;  Location: [facility] ENDOSCOPY;  Service: Cardiovascular;  Laterality: N/A;   CATARACT EXTRACTION W/ INTRAOCULAR LENS  IMPLANT, BILATERAL Bilateral    CATARACT EXTRACTION, BILATERAL  2012   COLONOSCOPY  06/30/2008   no repeats needed    COLONOSCOPY     had 3 or 4 in the past    CORONARY ARTERY BYPASS GRAFT  2007   "CABG X4"   CYST EXCISION PERINEAL  1980s   HAMMER TOE SURGERY Bilateral    JOINT REPLACEMENT     KNEE ARTHROPLASTY  07/30/2011   Procedure: COMPUTER ASSISTED TOTAL KNEE ARTHROPLASTY;  Surgeon: Meredith Pel;  Location: [place];  Service: Orthopedics;  Laterality: Left;  left total knee arthroplasty   MASTECTOMY SUBCUTANEOUS Bilateral    MULTIPLE TOOTH EXTRACTIONS     ORIF FINGER / THUMB FRACTURE Right ~ 1980   "repair of thumb injury"   PROSTATE BIOPSY     REPLACEMENT TOTAL KNEE BILATERAL Bilateral 2012   TEE WITHOUT CARDIOVERSION N/A 03/04/2017   Procedure: TRANSESOPHAGEAL ECHOCARDIOGRAM (TEE);  Surgeon: Burnell Blanks, MD;  Location: [place];  Service: Open Heart Surgery;  Laterality: N/A;   TOTAL KNEE REVISION Right 05/18/2019   Procedure: RIGHT PATELLA REVISION/REMOVAL;  Surgeon: Meredith Pel, MD;  Location: [place];  Service: Orthopedics;  Laterality: Right;   TRANSCATHETER AORTIC VALVE REPLACEMENT, TRANSFEMORAL N/A 03/04/2017   Procedure: TRANSCATHETER AORTIC VALVE REPLACEMENT, TRANSFEMORAL;  Surgeon: Burnell Blanks, MD;  Location: [place];  Service:  Open Heart Surgery;  Laterality: N/A;    Current Medications: No outpatient medications have been marked as taking for the 02/19/22 encounter (Appointment) with Imogene Burn, PA-C.     Allergies:   Peanut-containing drug products, Sulfonamide derivatives, Amlodipine, Eliquis [apixaban], Lisinopril, and Xarelto [rivaroxaban]   Social History   Socioeconomic History   Marital status: Married    Spouse name: Not on file   Number of children: 3   Years of education: Not on file   Highest education level: Not on file  Occupational History   Occupation: Clinical biochemist    Comment: builds malls  Tobacco Use   Smoking status: Former    Packs/day: 3.50    Years: 13.00    Total pack years: 45.50    Types: Cigarettes    Quit date: 1963    Years since quitting: 60.4   Smokeless tobacco: Never  Tobacco comments:    Former smoker 04/19/21  Vaping Use   Vaping Use: Never used  Substance and Sexual Activity   Alcohol use: Not Currently    Alcohol/week: 7.0 standard drinks of alcohol    Types: 7 Cans of beer per week    Comment: 1 beer daily after 5pm (04/19/21)   Drug use: No   Sexual activity: Never  Other Topics Concern   Not on file  Social History Narrative   FH of CAD, Male 1st degree relative less than age 21.   Social Determinants of Health   Financial Resource Strain: Low Risk  (02/16/2021)   Overall Financial Resource Strain (CARDIA)    Difficulty of Paying Living Expenses: Not very hard  Food Insecurity: No Food Insecurity (03/22/2021)   Hunger Vital Sign    Worried About Running Out of Food in the Last Year: Never true    Ran Out of Food in the Last Year: Never true  Transportation Needs: No Transportation Needs (03/22/2021)   PRAPARE - Hydrologist (Medical): No    Lack of Transportation (Non-Medical): No  Physical Activity: Insufficiently Active (03/22/2021)   Exercise Vital Sign    Days of Exercise per Week: 4 days    Minutes  of Exercise per Session: 30 min  Stress: No Stress Concern Present (03/22/2021)   [place]    Feeling of Stress : Not at all  Social Connections: Moderately Isolated (03/22/2021)   Social Connection and Isolation Panel [NHANES]    Frequency of Communication with Friends and Family: Three times a week    Frequency of Social Gatherings with Friends and Family: Three times a week    Attends Religious Services: Never    Active Member of Clubs or Organizations: No    Attends Music therapist: Never    Marital Status: Married     Family History:  The patient's ***family history includes Allergic rhinitis in his father; Asthma in his father; Breast cancer in his mother; Heart attack (age of onset: 12) in his father; Heart failure (age of onset: 58) in his mother; Uterine cancer in his mother.   ROS:   Please see the history of present illness.    ROS All other systems reviewed and are negative.   PHYSICAL EXAM:   VS:  There were no vitals taken for this visit.  Physical Exam  GEN: Well nourished, well developed, in no acute distress  HEENT: normal  Neck: no JVD, carotid bruits, or masses Cardiac:RRR; no murmurs, rubs, or gallops  Respiratory:  clear to auscultation bilaterally, normal work of breathing GI: soft, nontender, nondistended, + BS Ext: without cyanosis, clubbing, or edema, Good distal pulses bilaterally MS: no deformity or atrophy  Skin: warm and dry, no rash Neuro:  Alert and Oriented x 3, Strength and sensation are intact Psych: euthymic mood, full affect  Wt Readings from Last 3 Encounters:  02/06/22 268 lb (121.6 kg)  01/30/22 268 lb (121.6 kg)  12/19/21 269 lb (122 kg)      Studies/Labs Reviewed:   EKG:  EKG is*** ordered today.  The ekg ordered today demonstrates ***  Recent Labs: 03/28/2021: Pro B Natriuretic peptide (BNP) 354.0 12/05/2021: ALT 9; TSH 4.720 01/30/2022: BUN 36;  Creatinine, Ser 1.35; Hemoglobin 13.2; Platelets 234; Potassium 5.1; Sodium 135   Lipid Panel    Component Value Date/Time   CHOL 124 03/28/2021 0730   CHOL 171  08/11/2018 0735   TRIG 161.0 (H) 03/28/2021 0730   TRIG 143 06/30/2006 1132   HDL 34.60 (L) 03/28/2021 0730   HDL 34 (L) 08/11/2018 0735   CHOLHDL 4 03/28/2021 0730   VLDL 32.2 03/28/2021 0730   LDLCALC 57 03/28/2021 0730   LDLCALC 68 03/21/2020 0943    Additional studies/ records that were reviewed today include:     Echo 09/11/21 demonstrated  1. Left ventricular ejection fraction, by estimation, is 40 to 45%. The  left ventricle has mildly decreased function. The left ventricle  demonstrates global hypokinesis. There is mild concentric left ventricular hypertrophy. Left ventricular diastolic function could not be evaluated.   2. Right ventricular systolic function is normal. The right ventricular  size is normal.   3. Left atrial size was severely dilated.   4. The mitral valve is normal in structure. No evidence of mitral valve  regurgitation. No evidence of mitral stenosis.   5. The aortic valve has been repaired/replaced. Aortic valve  regurgitation is not visualized. No aortic stenosis is present. There is a  29 mm Sapien prosthetic (TAVR) valve present in the aortic position.  Procedure Date: 03/13/2019. Aortic valve mean  gradient measures 10.0 mmHg. Aortic valve Vmax measures 2.12 m/s. Aortic valve acceleration time measures 77 msec.   6. The inferior vena cava is normal in size with greater than 50%  respiratory variability, suggesting right atrial pressure of 3 mmHg.   Comparison(s): No significant change from prior study. Prior images  reviewed side by side.      Risk Assessment/Calculations:   {Does this patient have ATRIAL FIBRILLATION?[phone]}     ASSESSMENT:    No diagnosis found.   PLAN:  In order of problems listed above:  PAF DCCV 04/24/21 but  increased burden on recent pacer check  from 5%-99% with decrease in Biv Pacing 52% on Pradaxa, metoprolol. Rates 80-140/m.  Patient asymptomatic and recent echo shows severely dilated LA size.. Patient underwent successful DCCV with 3 stacked shocks to Bi-V paced rhythm 11/08/21, persistent Aflutter started on amiodarone 11/2021 and repeat DCCV 02/06/22    CAD CABG x 4 patent grafts 2018-no angina   ICM EF improved 40-45% on echo 08/2021-recent optival indicated dehydration. He's also trying to lose weight. I decreased metolazone 2.5 mg once weekly but patient stopped completely and developed swelling, he was asked to restart.   HTN BP runs low   HLD LDL 57 03/28/21   VT ICD followed by Dr. [name]   S/P TAVR valve stable on recent echo.     Shared Decision Making/Informed Consent   {Are you ordering a CV Procedure (e.g. stress test, cath, DCCV, TEE, etc)?   Press F2        :[id number]}    Medication Adjustments/Labs and Tests Ordered: Current medicines are reviewed at length with the patient today.  Concerns regarding medicines are outlined above.  Medication changes, Labs and Tests ordered today are listed in the Patient Instructions below. There are no Patient Instructions on file for this visit.   Signed, Ermalinda Barrios, PA-C  02/07/2022 8:20 AM    [facility] [address], [place], [place]  [address] Phone: [phone]; Fax: [phone]

## 2022-02-11 DIAGNOSIS — H353221 Exudative age-related macular degeneration, left eye, with active choroidal neovascularization: Secondary | ICD-10-CM | POA: Diagnosis not present

## 2022-02-14 ENCOUNTER — Telehealth: Payer: Self-pay | Admitting: Pharmacist

## 2022-02-14 NOTE — Chronic Care Management (AMB) (Unsigned)
Chronic Care Management Pharmacy Assistant   Name: Justin [name]  MRN: [id number] DOB: 1934/07/13  Reason for Encounter: Disease State / Hypertension Assessment Call   Conditions to be addressed/monitored: HTN  Recent office visits:  12/19/2021 [name] MD - Patient was seen for Urinary tract infection without hematuria, site unspecified. Started Cipro 500 mg twice daily. No follow up noted.   Recent consult visits:  01/30/2022 Malka So PA (cardiology) - Patient was seen for atypical atrial flutter and additional issues. Changed Amiodarone 200 mg to take 1 tablet daily. Follow up with Ermalinda Barrios and Tommye Standard as scheduled.  01/28/2022 Sharman Cheek RN (heartcare) - Patient was seen for Chronic systolic heart failure and Biventricular implantable cardioverter-defibrillator in situ. No medication changes. No follow up noted. (Multiple visits since 11/01/2021 last CCM encounter)  01/07/2022 Roma Kayser RN [facility]) - Patient was seen for Ischemic cardiomyopathy. No medication changes. No follow up noted.  11/23/2021 Virl Axe MD (cardiology) - Patient was seen for Paroxysmal atrial fibrillation and additional issues. Started Amiodarone 200 mg  Take 1 tablet twice daily for one month, then reduce and take 1 tablet once daily. Follow up in 3 months.   11/21/2021 Ermalinda Barrios PA-C (cardilogy) -Patient was seen for Paroxysmal atrial fibrillation and additional issues. Increased Metolazone 2.5 mg to 2 times weekly. Decreased Potassium 20 meq to 20 mEq Oral Daily, Take 2 tablets on Wed and Saturday with Metolazone. Follow up as scheduled.   11/12/2021 Virl Axe (cardiology) - Patient was seen for Chronic systolic (congestive) heart failure. No other chart notes.   Hospital visits:  Admitted to [facility] on 02/06/2022 ( 2 hours) due to Cardioversion.    New?Medications Started at [facility] Discharge:?? No medications started Medication Changes  at Hospital Discharge: No medications changed Medications Discontinued at Hospital Discharge: No medications discontinued Medications that remain the same after Hospital Discharge:??  -All other medications will remain the same.     Admitted to [facility] on 11/08/2021 ( 2 hours) due to Cardioversion.    New?Medications Started at [facility] Discharge:?? No medications started Medication Changes at Hospital Discharge: No medications changed Medications Discontinued at Hospital Discharge: No medications discontinued Medications that remain the same after Hospital Discharge:??  -All other medications will remain the same.     Medications: Outpatient Encounter Medications as of 02/14/2022  Medication Sig   allopurinol (ZYLOPRIM) 100 MG tablet Take 1 tablet (100 mg total) by mouth daily. (Patient taking differently: Take 100 mg by mouth daily as needed (Gout).)   amiodarone (PACERONE) 200 MG tablet Taking one tablet by mouth daily (Patient taking differently: Take 200 mg by mouth every morning.)   aspirin 81 MG tablet Take 1 tablet (81 mg total) by mouth daily.   atorvastatin (LIPITOR) 20 MG tablet TAKE ONE TABLET ONCE DAILY   Azelastine HCl 0.15 % SOLN Place 2 sprays into the nose in the morning and at bedtime.   benzonatate (TESSALON) 200 MG capsule Take 1 capsule (200 mg total) by mouth 2 (two) times daily as needed for cough.   cetirizine (ZYRTEC) 10 MG tablet Take 10 mg by mouth 2 (two) times daily. [employer]   colchicine 0.6 MG tablet Take 1 tablet (0.6 mg total) by mouth every 6 (six) hours as needed (gout).   dabigatran (PRADAXA) 150 MG CAPS capsule TAKE (1) CAPSULE TWICE DAILY.   diphenhydrAMINE (BENADRYL) 25 MG tablet Take 25 mg by mouth at bedtime.  ENTRESTO 97-103 MG TAKE 1 TABLET BY MOUTH TWICE DAILY.   EPINEPHrine 0.3 mg/0.3 mL IJ SOAJ injection Inject 0.3 mLs (0.3 mg total) into the muscle as needed for anaphylaxis.   famotidine (PEPCID) 20 MG tablet  TAKE ONE TABLET BY MOUTH AT BEDTIME.   fluorouracil (EFUDEX) 5 % cream Apply 1 application topically 2 (two) times daily as needed (rash).   furosemide (LASIX) 80 MG tablet Take 1 tablet (80 mg total) by mouth daily.   HYDROcodone bit-homatropine (HYCODAN) 5-1.5 MG/5ML syrup Take 5 mLs by mouth every 4 (four) hours as needed for cough.   isosorbide mononitrate (IMDUR) 30 MG 24 hr tablet TAKE ONE TABLET ONCE DAILY   ketotifen (ZADITOR) 0.025 % ophthalmic solution Apply 1 drop to eye daily as needed (itchy eyes).    levothyroxine (SYNTHROID) 100 MCG tablet TAKE ONE TABLET BY MOUTH DAILY.   meclizine (ANTIVERT) 25 MG tablet Take 1 tablet (25 mg total) by mouth every 4 (four) hours as needed for dizziness.   methocarbamol (ROBAXIN) 500 MG tablet Take 1 tablet (500 mg total) by mouth every 8 (eight) hours as needed for muscle spasms.   metoCLOPramide (REGLAN) 10 MG tablet TAKE ONE TABLET BY MOUTH EVERY MORNING WITH BREAKFAST   metolazone (ZAROXOLYN) 2.5 MG tablet Take 1 tablet (2.5 mg total) by mouth 2 (two) times a week. Wednesday and Saturday   metoprolol succinate (TOPROL-XL) 50 MG 24 hr tablet Take 1.5 tablets (75 mg total) by mouth daily. Take with or immediately following a meal.   montelukast (SINGULAIR) 10 MG tablet TAKE 1 TABLET ONCE DAILY IN THE EVENING.   Multiple Vitamins-Minerals (PRESERVISION AREDS PO) Take 2 tablets by mouth every morning.   omeprazole (PRILOSEC) 20 MG capsule TAKE (1) CAPSULE TWICE DAILY.   potassium chloride SA (KLOR-CON M20) 20 MEQ tablet Take 1 tablet (20 mEq total) by mouth daily. Take 1 tablet (20 mEq total) by mouth daily. (Patient taking differently: Take 20-40 mEq by mouth See admin instructions. Tale 40 meq on Wednesday and Saturday all the other days take 20 meq)   spironolactone (ALDACTONE) 25 MG tablet TAKE ONE TABLET BY MOUTH EVERY DAY.   triamcinolone cream (KENALOG) 0.1 % Apply 1 application topically daily as needed for dry skin.   valACYclovir (VALTREX)  500 MG tablet Take 500 mg by mouth 2 (two) times daily as needed (cold sores).   No facility-administered encounter medications on file as of 02/14/2022.  Fill History: allopurinol 100 mg tablet 11/21/2021 90   amiodarone 200 mg tablet 01/14/2022 60   atorvastatin 20 mg tablet 01/30/2022 90   benzonatate 200 mg capsule 11/21/2021 90   colchicine 0.6 mg tablet 10/30/2021 15   Pradaxa 150 mg capsule 12/29/2021 30   famotidine 20 mg tablet 01/18/2022 90   fluticasone propionate 50 mcg/actuation nasal spray,suspension 11/14/2021 30   furosemide 80 mg tablet 11/24/2021 90   isosorbide mononitrate ER 30 mg tablet,extended release 24 hr 02/01/2022 90   levothyroxine 100 mcg tablet 11/21/2021 90   metoclopramide 10 mg tablet 01/31/2022 30   metolazone 2.5 mg tablet 10/24/2021 84   metoprolol succinate ER 50 mg tablet,extended release 24 hr 02/01/2022 90   montelukast 10 mg tablet 11/21/2021 90   omeprazole 20 mg capsule,delayed release 11/21/2021 90   potassium chloride ER 20 mEq tablet,extended release(part/cryst) 11/21/2021 70   Entresto 97 mg-103 mg tablet 01/30/2022 30   spironolactone 25 mg tablet 01/30/2022 90   Reviewed chart prior to disease state call. Spoke with patient  regarding BP  Recent Office Vitals: BP Readings from Last 3 Encounters:  02/06/22 (!) 72/47  01/30/22 (!) 86/46  12/19/21 116/64   Pulse Readings from Last 3 Encounters:  02/06/22 (!) 45  01/30/22 68  12/19/21 60    Wt Readings from Last 3 Encounters:  02/06/22 268 lb (121.6 kg)  01/30/22 268 lb (121.6 kg)  12/19/21 269 lb (122 kg)     Kidney Function Lab Results  Component Value Date/Time   CREATININE 1.35 (H) 01/30/2022 04:06 PM   CREATININE 1.48 (H) 01/15/2022 12:48 PM   CREATININE 1.27 (H) 03/21/2020 09:43 AM   CREATININE 0.74 08/14/2016 01:11 PM   GFR 53.29 (L) 03/28/2021 07:30 AM   GFRNONAA 51 (L) 01/30/2022 04:06 PM   GFRAA 66 04/04/2020 11:32 AM       Latest Ref Rng &  Units 01/30/2022    4:06 PM 01/15/2022   12:48 PM 01/01/2022   12:06 PM  BMP  Glucose 70 - 99 mg/dL 93  115  133   BUN 8 - 23 mg/dL 36  40  47   Creatinine 0.61 - 1.24 mg/dL 1.35  1.48  1.60   BUN/Creat Ratio 10 - '24  27  29   '$ Sodium 135 - 145 mmol/L 135  135  132   Potassium 3.5 - 5.1 mmol/L 5.1  5.1  4.6   Chloride 98 - 111 mmol/L 101  101  101   CO2 22 - 32 mmol/L '25  19  18   '$ Calcium 8.9 - 10.3 mg/dL 8.6  8.4  8.2     Current antihypertensive regimen:  Entresto 97/103 1 tablet twice daily Imdur 30 mg daily Metoprolol 50 mg 1.5 tablets daily Furosemide 80 mg daily Spironolactone 25 mg daily  How often are you checking your Blood Pressure? Patient has not been checking, he has been seeing cardiology a lot recently. He states he will start doing this again and keep a log.   Current home BP readings: Patient has not been checking  What recent interventions/DTPs have been made by any provider to improve Blood Pressure control since last CPP Visit: No recent interventions.   Any recent hospitalizations or ED visits since last visit with CPP? Admitted to [facility] on 02/06/2022 ( 2 hours) due to Cardioversion.                                                                   Admitted to [facility] on 11/08/2021 ( 2 hours) due to Cardioversion.     What diet changes have been made to improve Blood Pressure Control?  Patient eating smaller amounts mediterranean diet Breakfast - patient will have boiled egg and toast or cereal Lunch - patient will have glass of V8 and crackers with cheese Dinner - patient will have a meat and vegetable, sometimes rice or pasta Snack - patient will have berries  What exercise is being done to improve your Blood Pressure Control?  Patient walks in a store 3 times a week,  walking around the house and he does all the cooking.  Adherence Review: Is the patient currently on ACE/ARB medication? No Does the patient have  >5 day gap between last estimated fill dates? No  Care  Gaps: AWV - message sent to Ramond Craver Last BP - 72/47 on 02/06/2022 Last A1C - 6.6 on 03/28/2021 Covid booster - overdue  Star Rating Drugs: Atorvastatin '20mg'$  - last filled 01/30/2022 90 DS at [place] Pharmacist Assistant [phone]

## 2022-02-19 ENCOUNTER — Other Ambulatory Visit: Payer: Self-pay | Admitting: *Deleted

## 2022-02-19 ENCOUNTER — Ambulatory Visit: Payer: Medicare Other | Admitting: Physician Assistant

## 2022-02-19 ENCOUNTER — Encounter: Payer: Self-pay | Admitting: Physician Assistant

## 2022-02-19 VITALS — BP 98/60 | HR 66 | Ht 66.5 in | Wt 268.8 lb

## 2022-02-19 DIAGNOSIS — I4819 Other persistent atrial fibrillation: Secondary | ICD-10-CM | POA: Diagnosis not present

## 2022-02-19 DIAGNOSIS — I255 Ischemic cardiomyopathy: Secondary | ICD-10-CM

## 2022-02-19 DIAGNOSIS — I2510 Atherosclerotic heart disease of native coronary artery without angina pectoris: Secondary | ICD-10-CM | POA: Diagnosis not present

## 2022-02-19 DIAGNOSIS — Z952 Presence of prosthetic heart valve: Secondary | ICD-10-CM | POA: Diagnosis not present

## 2022-02-19 DIAGNOSIS — I10 Essential (primary) hypertension: Secondary | ICD-10-CM | POA: Diagnosis not present

## 2022-02-19 DIAGNOSIS — Z95810 Presence of automatic (implantable) cardiac defibrillator: Secondary | ICD-10-CM

## 2022-02-19 LAB — BASIC METABOLIC PANEL
BUN/Creatinine Ratio: 23 (ref 10–24)
BUN: 33 mg/dL — ABNORMAL HIGH (ref 8–27)
CO2: 20 mmol/L (ref 20–29)
Calcium: 8.6 mg/dL (ref 8.6–10.2)
Chloride: 98 mmol/L (ref 96–106)
Creatinine, Ser: 1.43 mg/dL — ABNORMAL HIGH (ref 0.76–1.27)
Glucose: 114 mg/dL — ABNORMAL HIGH (ref 70–99)
Potassium: 4.9 mmol/L (ref 3.5–5.2)
Sodium: 134 mmol/L (ref 134–144)
eGFR: 48 mL/min/{1.73_m2} — ABNORMAL LOW (ref >59)

## 2022-02-19 MED ORDER — ENTRESTO 97-103 MG PO TABS: 1.0000 | ORAL_TABLET | Freq: Two times a day (BID) | ORAL | 2 refills | Status: DC

## 2022-02-21 ENCOUNTER — Telehealth: Payer: Self-pay | Admitting: Internal Medicine

## 2022-02-21 NOTE — Telephone Encounter (Signed)
Pt is returning call, and is requesting call back.

## 2022-02-21 NOTE — Telephone Encounter (Signed)
I had not tried to call pt but I returned his call to let him know that I had sent him a MyChart message regarding his normal labs.

## 2022-02-22 ENCOUNTER — Encounter: Payer: Medicare Other | Admitting: Physician Assistant

## 2022-02-27 ENCOUNTER — Ambulatory Visit (INDEPENDENT_AMBULATORY_CARE_PROVIDER_SITE_OTHER): Payer: Medicare Other

## 2022-02-27 ENCOUNTER — Ambulatory Visit: Payer: Medicare Other | Admitting: Orthopedic Surgery

## 2022-02-27 DIAGNOSIS — M545 Low back pain, unspecified: Secondary | ICD-10-CM | POA: Diagnosis not present

## 2022-02-27 NOTE — Progress Notes (Unsigned)
x

## 2022-02-28 ENCOUNTER — Telehealth: Payer: Self-pay | Admitting: Family Medicine

## 2022-02-28 ENCOUNTER — Encounter: Payer: Self-pay | Admitting: Orthopedic Surgery

## 2022-02-28 DIAGNOSIS — I2510 Atherosclerotic heart disease of native coronary artery without angina pectoris: Secondary | ICD-10-CM

## 2022-02-28 DIAGNOSIS — I10 Essential (primary) hypertension: Secondary | ICD-10-CM

## 2022-02-28 NOTE — Telephone Encounter (Signed)
-----   Message from Viona Gilmore, A [facility] sent at 02/14/2022  3:46 PM EDT ----- Regarding: CCM referral Hi,  Can you please put in a CCM referral for him when you get the chance?  Thank you! Maddie

## 2022-02-28 NOTE — Progress Notes (Signed)
Office Visit Note   Patient: Justin [name]           Date of Birth: [birth year]           MRN: [id number] Visit Date: 02/27/2022 Requested by: Laurey Morale, MD [place],  [place] [id number] PCP: Laurey Morale, MD  Subjective: Chief Complaint  Patient presents with   Lower Back - Pain    HPI: Patient presents for evaluation of multiple problems.  #1 low back pain.  Patient reports pain for the last year in the middle of his back.  Above the buttock but only with leaning or doing housework.  Has relief with sitting and reclining.  He is able to walk for 20 minutes.  Denies much in the way of radiating leg pain but does report some occasional right-sided buttock pain.  2.  The patient describes right-sided rib and muscle pain along the inferior aspect of his thoracic cavity.  When he rolls over in bed at night he reports a sharp pain that subsides.  Does feel it some when he sneezes as well.  Okay with getting up and down out of a chair and straining.  3.  Patient also reports bilateral hip burning sensation left more than right which she localizes to the trochanteric area.  This also gets better with rest.  He is able to sleep on the left-hand side but it does bother him.  4.  Patient also reports right-sided lateral knee pain which has been relatively constant since his knee replacement and later revision patellar placement.  Denies any swelling or fevers and chills.  5.  He also reports left hand abnormality and he is concerned about a palmar ligament that he feels in his hand.  Does not really report symptoms with that but just notices a cord along the palmar aspect of his left hand.              ROS: All systems reviewed are negative as they relate to the chief complaint within the history of present illness.  Patient denies  fevers or chills.   Assessment & Plan: Visit Diagnoses:  1. Low back pain, unspecified back pain laterality, unspecified chronicity,  unspecified whether sciatica present     Plan: Impression is low back pain with radiographs which show primarily degenerative changes.  No real radiating leg pain.  Part of the history is consistent with spinal stenosis.  He is on blood thinners and really cannot come off blood thinners so imaging the back could be considered if his symptoms worsen.  Does not want to really consider surgical intervention.  He is not having any red flag symptoms and radiographs do not show anything destructive involving the vertebral bodies.  This is something I think we can watch for now but if his symptoms change or worsen we should get an MRI scan for better delineation of what appears to be some degree of arthritis and spinal stenosis.  Regarding the right-sided rib pain he is only mildly tender to palpation in this area.  Does not feel like the rib is unstable to palpation compared to the left-hand side.  This too is something that I think we can watch for now.  He does have bilateral trochanteric bursitis symptoms.  Discussed injection today but he wants to hold off on that.  No real groin pain and no arthritis visible on the AP portion of the lumbar spine.  Right knee lateral pain has been  stable.  Right now the knee is functional with no effusion and this is something that can be watched.  The left hand has Dupuytren's contracture which only flexes the MCP joint to about 10 or 15 degrees.  No intervention indicated for that at this time  Follow-Up Instructions: Return if symptoms worsen or fail to improve.   Orders:  Orders Placed This Encounter  Procedures   XR Lumbar Spine 2-3 Views   No orders of the defined types were placed in this encounter.     Procedures: No procedures performed   Clinical Data: No additional findings.  Objective: Vital Signs: There were no vitals taken for this visit.  Physical Exam:   Constitutional: Patient appears well-developed HEENT:  Head:  Normocephalic Eyes:EOM are normal Neck: Normal range of motion Cardiovascular: Normal rate Pulmonary/chest: Effort normal Neurologic: Patient is alert Skin: Skin is warm Psychiatric: Patient has normal mood and affect   Ortho Exam: Ortho exam demonstrates normal gait and alignment.  No nerve root tension signs.  No groin pain with internal/external Tatian of leg.  Pedal pulses palpable.  No paresthesias L1 S1 bilaterally.  Both knees have excellent range of motion with no effusion.  Full extension and flexion past 90.  Quad strength excellent bilaterally.  Negative patellar apprehension and no patellofemoral crepitus in either knee.  Does have left greater than right trochanteric tenderness.  On that left hand he has a Dupuytren's contracture which is flexing the MCP joint about 10 to 15 degrees.  Palpation of the inferior aspect of the thoracic cavity demonstrates no asymmetric rib instability and no discrete tenderness to palpation.  Specialty Comments:  No specialty comments available.  Imaging: XR Lumbar Spine 2-3 Views  Result Date: 02/28/2022 AP lateral radiographs lumbar spine reviewed.  Calcification of the aorta is visualized.  Degenerative disc disease present diffusely throughout the lumbar spine.  Facet arthritis which is moderate to severe is also present.  No spondylolisthesis or compression fractures.  No malalignment or scoliosis.    PMFS History: Patient Active Problem List   Diagnosis Date Noted   Atypical atrial flutter ([name]) 01/30/2022   Paroxysmal atrial fibrillation ([place])    Persistent atrial fibrillation ([place]) 04/19/2021   Secondary hypercoagulable state ([place]) 04/19/2021   Raynaud's phenomenon without gangrene 07/11/2020   Vertigo 07/11/2020   Loosening of knee joint prosthesis ([place]) 05/18/2019   Cough 04/30/2019   Gastroesophageal reflux disease 04/30/2019   S/P ICD (internal cardiac defibrillator) procedure 05/03/2017   NICM (nonischemic cardiomyopathy) ([place])  05/02/2017   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   Severe aortic valve stenosis 03/04/2017   Hypokalemia due to loss of potassium with diuresis 01/10/2017   Aortic stenosis 02/40/9735   Systolic heart failure ([place]) 01/10/2017   Acute on chronic diastolic CHF (congestive heart failure) ([facility]) 01/07/2017   Hypothyroidism 12/23/2016   Prostate cancer ([place])    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   Edema of both legs 09/01/2014   Asthma, intermittent 09/01/2014   Aortic valve disorders 01/01/2011   Coronary artery disease involving native coronary artery of native heart without angina pectoris    GOUT 09/12/2009   CAD, AUTOLOGOUS BYPASS GRAFT 05/10/2009   MURMUR 05/08/2009   BRUIT 05/08/2009   MUSCLE CRAMPS 12/12/2008   KNEE SPRAIN 09/21/2008   Osteoarthritis 07/07/2008   CONTUSION, HIP 05/13/2008   BPH with urinary obstruction 02/01/2008   LOW BACK PAIN SYNDROME 02/01/2008   TESTOSTERONE DEFICIENCY 07/03/2007   Hyperlipidemia 07/03/2007   Essential hypertension 07/03/2007  MYOCARDIAL INFARCTION, HX OF 07/03/2007   Allergic rhinitis 07/03/2007   COLONIC POLYPS, HX OF 07/03/2007   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   Past Medical History:  Diagnosis Date   Age-related macular degeneration, dry, both eyes    Allergic    "24/7; 365 days/year; I'm allergic to pollens, dust, all [place] grasses/trees, mold, mildue, cats, dogs" (01/07/2017)   Anal fissure    Asthma    sees [name]    Benign prostatic hypertrophy    (sees Dr. Denman George   CAD (coronary artery disease)    a. s/p CABG 2007. b. Cath 08/2016 - 4/4 patent grafts.   Carotid bruit    carotid u/s 10/10: 0.39% bilaterally   Chronic combined systolic and diastolic CHF (congestive heart failure) (HCC)    Complication of anesthesia 1980s   "w/anal cyst OR, he gave me a saddle block then put a narcotic in spinal cord; had a severe reaction to that" (01/07/2017)   Congestive heart failure (CHF) [facility])    ED  (erectile dysfunction)    Family history of adverse reaction to anesthesia    "daughter wakes up during OR" (01/07/2017)   GERD (gastroesophageal reflux disease)    Gout    HTN (hypertension)    Hx of colonic polyps    (sees Dr. Henrene Pastor)   Hyperlipidemia    Hypothyroidism    Moderate to severe aortic stenosis    a. s/p TAVR 02/2017.   Myocardial infarction [facility]) ~ 2000   Obesity    Osteoarthritis    "was in my knees, hands" (01/07/2017 )   PAF (paroxysmal atrial fibrillation) ([place])    a. documented post TAVR.   Precancerous skin lesion    (sees Dr. 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   29 mm Edwards Sapien 3 transcatheter heart valve placed via percutaneous right transfemoral approach    Family History  Problem Relation Age of Onset   Heart attack Father 97   Allergic rhinitis Father    Asthma Father    Heart failure Mother 62   Uterine cancer Mother    Breast cancer Mother    Colon cancer Neg Hx    Esophageal cancer Neg Hx     Past Surgical History:  Procedure Laterality Date   ANUS SURGERY     "opened it back up cause it wouldn't heal; wound up w/a fissure" (01/07/2017)   BIV ICD INSERTION CRT-D N/A 05/02/2017   Procedure: BIV ICD INSERTION CRT-D;  Surgeon: Deboraha Sprang, MD;  Location: [facility];  Service: Cardiovascular;  Laterality: N/A;   CARDIAC CATHETERIZATION  10/29/2005   CARDIAC CATHETERIZATION N/A 08/27/2016   Procedure: Right/Left Heart Cath and Coronary/Graft Angiography;  Surgeon: Burnell Blanks, MD;  Location: [facility];  Service: Cardiovascular;  Laterality: N/A;   CARDIOVERSION N/A 04/24/2021   Procedure: CARDIOVERSION;  Surgeon: Fay Records, MD;  Location: [facility];  Service: Cardiovascular;  Laterality: N/A;   CARDIOVERSION N/A 11/08/2021   Procedure: CARDIOVERSION;  Surgeon: Pixie Casino, MD;  Location: [facility] ENDOSCOPY;  Service:  Cardiovascular;  Laterality: N/A;   CARDIOVERSION N/A 02/06/2022   Procedure: CARDIOVERSION;  Surgeon: Josue Hector, MD;  Location: [facility] ENDOSCOPY;  Service: Cardiovascular;  Laterality: N/A;   CATARACT EXTRACTION W/ INTRAOCULAR LENS  IMPLANT, BILATERAL Bilateral    CATARACT EXTRACTION, BILATERAL  2012   COLONOSCOPY  06/30/2008  no repeats needed    COLONOSCOPY     had 3 or 4 in the past    CORONARY ARTERY BYPASS GRAFT  2007   "CABG X4"   CYST EXCISION PERINEAL  1980s   HAMMER TOE SURGERY Bilateral    JOINT REPLACEMENT     KNEE ARTHROPLASTY  07/30/2011   Procedure: COMPUTER ASSISTED TOTAL KNEE ARTHROPLASTY;  Surgeon: Meredith Pel;  Location: [facility];  Service: Orthopedics;  Laterality: Left;  left total knee arthroplasty   MASTECTOMY SUBCUTANEOUS Bilateral    MULTIPLE TOOTH EXTRACTIONS     ORIF FINGER / THUMB FRACTURE Right ~ 1980   "repair of thumb injury"   PROSTATE BIOPSY     REPLACEMENT TOTAL KNEE BILATERAL Bilateral 2012   TEE WITHOUT CARDIOVERSION N/A 03/04/2017   Procedure: TRANSESOPHAGEAL ECHOCARDIOGRAM (TEE);  Surgeon: Burnell Blanks, MD;  Location: [place];  Service: Open Heart Surgery;  Laterality: N/A;   TOTAL KNEE REVISION Right 05/18/2019   Procedure: RIGHT PATELLA REVISION/REMOVAL;  Surgeon: Meredith Pel, MD;  Location: [facility];  Service: Orthopedics;  Laterality: Right;   TRANSCATHETER AORTIC VALVE REPLACEMENT, TRANSFEMORAL N/A 03/04/2017   Procedure: TRANSCATHETER AORTIC VALVE REPLACEMENT, TRANSFEMORAL;  Surgeon: Burnell Blanks, MD;  Location: [place];  Service: Open Heart Surgery;  Laterality: N/A;   Social History   Occupational History   Occupation: Clinical biochemist    Comment: builds malls  Tobacco Use   Smoking status: Former    Packs/day: 3.50    Years: 13.00    Total pack years: 45.50    Types: Cigarettes    Quit date: 1963    Years since quitting: 60.5   Smokeless tobacco: Never   Tobacco comments:    Former smoker 04/19/21   Vaping Use   Vaping Use: Never used  Substance and Sexual Activity   Alcohol use: Not Currently    Alcohol/week: 7.0 standard drinks of alcohol    Types: 7 Cans of beer per week    Comment: 1 beer daily after 5pm (04/19/21)   Drug use: No   Sexual activity: Never

## 2022-02-28 NOTE — Telephone Encounter (Signed)
Done

## 2022-03-04 ENCOUNTER — Telehealth: Payer: Self-pay | Admitting: Family Medicine

## 2022-03-04 ENCOUNTER — Ambulatory Visit (INDEPENDENT_AMBULATORY_CARE_PROVIDER_SITE_OTHER): Payer: Medicare Other

## 2022-03-04 DIAGNOSIS — Z95810 Presence of automatic (implantable) cardiac defibrillator: Secondary | ICD-10-CM

## 2022-03-04 DIAGNOSIS — I2510 Atherosclerotic heart disease of native coronary artery without angina pectoris: Secondary | ICD-10-CM

## 2022-03-04 DIAGNOSIS — I5022 Chronic systolic (congestive) heart failure: Secondary | ICD-10-CM

## 2022-03-04 DIAGNOSIS — I10 Essential (primary) hypertension: Secondary | ICD-10-CM

## 2022-03-04 NOTE — Telephone Encounter (Signed)
Done

## 2022-03-05 ENCOUNTER — Telehealth: Payer: Self-pay | Admitting: Family Medicine

## 2022-03-05 ENCOUNTER — Other Ambulatory Visit: Payer: Self-pay | Admitting: Family Medicine

## 2022-03-05 DIAGNOSIS — E039 Hypothyroidism, unspecified: Secondary | ICD-10-CM

## 2022-03-05 DIAGNOSIS — B351 Tinea unguium: Secondary | ICD-10-CM

## 2022-03-05 NOTE — Addendum Note (Signed)
Addended by: [name] A on: 03/05/2022 05:06 PM   Modules accepted: Orders

## 2022-03-05 NOTE — Progress Notes (Signed)
EPIC Encounter for ICM Monitoring  Patient Name: Justin [name] is a 89 y.o. male Date: 03/05/2022 Primary Care Physican: Laurey Morale, MD Primary Cardiologist: Angelena Form Electrophysiologist: Vergie Living Pacing: 82.4% Effective 80.9%   (% of Time Since 06-Feb-2022) 11/26/2021 Weight:   267 lbs 12/11/2021 Weight: 267 lbs 12/17/2021 Weight: No current weight 01/08/2022 Weight: 260 lbs   Clinical Status Since 06-Feb-2022 AT/AF 1053 Time in AT/AF 19.6 hr/day (81.8%) Longest AT/AF 11 hours                                     Spoke with patient and heart failure questions reviewed.  Pt denies any fluid symptoms.   Cardioversion completed 6/14.  He cannot tell he is out of rhythm and feels great.     Optivol thoracic impedance suggesting normal fluid levels.  AT/AF Episodes listed 7/8-7/10.   Prescribed: Furosemide 80 mg take 1 tablet every day.  Metolazone 2.5 mg Take 1 tablet by mouth twice a week Wednesday and Saturday (increased on 3/29). KLC 20 mEq Take 1 tablet (20 mEq total) by mouth daily.   Per 4/14 phone note, decrease K+ to 1 tablet with Metolazone Spironolactone 25 mg take 1 tablet daily   Labs:  01/15/2022 Creatinine 1.48, BUN 40, Potassium 5.1, Sodium 135, GFR 46 01/01/2022 Creatinine 1.60, BUN 47, Potassium 4.6, Sodium 132, GFR 42 12/21/2021 Creatinine 1.81, BUN 51, Potassium 5.1, Sodium 135, GFR 36 12/05/2021 Creatinine 1.34, BUN 36, Potassium 5.3, Sodium 136, GFR 52 11/08/2021 Creatinine 1.30, BUN 33, Potassium 4.3, Sodium 138 11/02/2021 Creatinine 1.30, BUN 39, Potassium 4.7, Sodium 136, GFR 54  10/23/2021 Creatinine 1.29, BUN 31, Potassium 5.3, Sodium 134, GFR 54  A complete set of results can be found in Results Review.   Recommendations:  No changes and encouraged to call if experiencing any fluid symptoms.   Follow-up plan: ICM clinic phone appointment on 04/09/2022.   91 day device clinic remote transmission 04/08/2022.     EP/Cardiology Office Visits:  07/03/2022  with Dr Angelena Form.  04/22/2022 with Dr [name].   Copy of ICM check sent to Dr. [name].    3 month ICM trend: 03/04/2022.    12-14 Month ICM trend:     [name], RN 03/05/2022 4:21 PM

## 2022-03-05 NOTE — Telephone Encounter (Signed)
Done

## 2022-03-06 ENCOUNTER — Other Ambulatory Visit: Payer: Self-pay | Admitting: Family Medicine

## 2022-03-06 ENCOUNTER — Telehealth: Payer: Self-pay

## 2022-03-06 DIAGNOSIS — E039 Hypothyroidism, unspecified: Secondary | ICD-10-CM

## 2022-03-06 NOTE — Telephone Encounter (Signed)
Reviewed HF transmission. Appears patient has returned to AT/AF on 03/01/22 post DCCV 02/06/22. Previous notes indicate patient is asymptomatic when he is in AT/AF. +OAC. Routing to Dr. [name]

## 2022-03-06 NOTE — Telephone Encounter (Signed)
-----   Message from [name], RN sent at 03/05/2022  4:31 PM EDT ----- Regarding: AT/AF Hey,  Can you review 7/10 Care[facility] report.  It is saying he is out of rhythm since he had cardioversion on 6/14.  I don't know if it is afib or flutter.  Will you discuss with Dr [name] if you think it is necessary?  Thanks! [name]

## 2022-03-06 NOTE — Telephone Encounter (Signed)
Done

## 2022-03-12 ENCOUNTER — Encounter: Payer: Self-pay | Admitting: Internal Medicine

## 2022-03-12 ENCOUNTER — Ambulatory Visit (INDEPENDENT_AMBULATORY_CARE_PROVIDER_SITE_OTHER): Payer: Medicare Other | Admitting: Internal Medicine

## 2022-03-12 VITALS — BP 108/60 | HR 95 | Ht 66.5 in | Wt 270.6 lb

## 2022-03-12 DIAGNOSIS — Z95810 Presence of automatic (implantable) cardiac defibrillator: Secondary | ICD-10-CM | POA: Insufficient documentation

## 2022-03-12 DIAGNOSIS — I428 Other cardiomyopathies: Secondary | ICD-10-CM

## 2022-03-12 DIAGNOSIS — I4819 Other persistent atrial fibrillation: Secondary | ICD-10-CM | POA: Diagnosis not present

## 2022-03-12 DIAGNOSIS — I5043 Acute on chronic combined systolic (congestive) and diastolic (congestive) heart failure: Secondary | ICD-10-CM | POA: Insufficient documentation

## 2022-03-12 DIAGNOSIS — I5042 Chronic combined systolic (congestive) and diastolic (congestive) heart failure: Secondary | ICD-10-CM

## 2022-03-12 NOTE — Patient Instructions (Signed)
Medication Instructions:  Your physician recommends that you continue on your current medications as directed. Please refer to the Current Medication list given to you today.  *If you need a refill on your cardiac medications before your next appointment, please call your pharmacy*   Lab Work: None ordered.  If you have labs (blood work) drawn today and your tests are completely normal, you will receive your results only by: [place] (if you have MyChart) OR A paper copy in the mail If you have any lab test that is abnormal or we need to change your treatment, we will call you to review the results.   Testing/Procedures: None ordered.    Follow-Up: At [facility], you and your health needs are our priority.  As part of our continuing mission to provide you with exceptional heart care, we have created designated Provider Care Teams.  These Care Teams include your primary Cardiologist (physician) and Advanced Practice Providers (APPs -  Physician Assistants and Nurse Practitioners) who all work together to provide you with the care you need, when you need it.  We recommend signing up for the patient portal called "MyChart".  Sign up information is provided on this After Visit Summary.  MyChart is used to connect with patients for Virtual Visits (Telemedicine).  Patients are able to view lab/test results, encounter notes, upcoming appointments, etc.  Non-urgent messages can be sent to your provider as well.   To learn more about what you can do with MyChart, go to [email or web address].    Your next appointment:   Keep follow up appointment as scheduled with Dr [name]  Important Information About Sugar

## 2022-03-12 NOTE — Telephone Encounter (Signed)
Recurrent atrial arrhythmia  He was started on amio a few months ago for recurrent atrial arrhythmia-- I would bring into office ( [facility] or  [address]) and attempt pace termination  and then conversation re ongoing therapy with amio Thanks SK

## 2022-03-12 NOTE — Progress Notes (Signed)
Patient Care Team: Laurey Morale, MD as PCP - General Angelena Form [name], MD as PCP - Cardiology (Cardiology) Deboraha Sprang, MD as PCP - Electrophysiology (Cardiology) Festus Aloe, MD as Consulting Physician (Urology) Viona Gilmore, [facility] as Pharmacist (Pharmacist)   HPI  Justin [name] is a 88 y.o. male Seen for follow-up of a CRT ICD 9/18 implanted because of syncope  and new left bundle branch block following TAVR ; hx of Afib on dabigitran and ASA;  intercurrent Afib with DCCV 9/22   Initially significantly improved following CRT.  Now with repeated issues with exercise intolerance.  Some edema.  No chest pain.  Interval atrial fibrillation requiring repeat cardioversion 3/16 not associated with significant symptoms  Significant PVC burden which is affecting his CRT pacing percentage.  Repeat echo as noted below was surprisingly good with some interval improvement (may be)   Repeat cardioversion 3/23 and then started on amiodarone.  Seen 6/23 with persistent atrial flutter for about 2 months and then submitted for cardioversion.  When seen 2 weeks later, he was again out of rhythm.  No appreciable change in symptoms.  Recently visiting his daughter.  Increase salt intake some edema.  No change in baseline dyspnea.  No chest pain     DATE TEST EF   1/18 LHC  Patent grafts  8/18 Echo   40-45 %   6/19 Echo   40-45 %   9/20 Echo  35-40%   1/23 Echo 40-45%     DATE Cr K Hgb  11/22  1.08 4.7 13.0(8/22)    3/23 1.3 4.3 14.6            Past Medical History:  Diagnosis Date   Age-related macular degeneration, dry, both eyes    Allergic    "24/7; 365 days/year; I'm allergic to pollens, dust, all southern grasses/trees, mold, mildue, cats, dogs" (01/07/2017)   Anal fissure    Asthma    sees [name]    Benign prostatic hypertrophy    (sees Dr. Denman George   CAD (coronary artery disease)    a. s/p CABG 2007. b. Cath 08/2016 - 4/4 patent grafts.    Carotid bruit    carotid u/s 10/10: 0.39% bilaterally   Chronic combined systolic and diastolic CHF (congestive heart failure) (HCC)    Complication of anesthesia 1980s   "w/anal cyst OR, he gave me a saddle block then put a narcotic in spinal cord; had a severe reaction to that" (01/07/2017)   Congestive heart failure (CHF) [facility])    ED (erectile dysfunction)    Family history of adverse reaction to anesthesia    "daughter wakes up during OR" (01/07/2017)   GERD (gastroesophageal reflux disease)    Gout    HTN (hypertension)    Hx of colonic polyps    (sees Dr. Henrene Pastor)   Hyperlipidemia    Hypothyroidism    Moderate to severe aortic stenosis    a. s/p TAVR 02/2017.   Myocardial infarction [facility]) ~ 2000   Obesity    Osteoarthritis    "was in my knees, hands" (01/07/2017 )   PAF (paroxysmal atrial fibrillation) ([place])    a. documented post TAVR.   Precancerous skin lesion    (sees Dr. 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   29 mm Edwards Sapien 3 transcatheter heart valve placed via percutaneous  right transfemoral approach    Past Surgical History:  Procedure Laterality Date   ANUS SURGERY     "opened it back up cause it wouldn't heal; wound up w/a fissure" (01/07/2017)   BIV ICD INSERTION CRT-D N/A 05/02/2017   Procedure: BIV ICD INSERTION CRT-D;  Surgeon: Deboraha Sprang, MD;  Location: [place] [facility];  Service: Cardiovascular;  Laterality: N/A;   CARDIAC CATHETERIZATION  10/29/2005   CARDIAC CATHETERIZATION N/A 08/27/2016   Procedure: Right/Left Heart Cath and Coronary/Graft Angiography;  Surgeon: Burnell Blanks, MD;  Location: [place] [facility];  Service: Cardiovascular;  Laterality: N/A;   CARDIOVERSION N/A 04/24/2021   Procedure: CARDIOVERSION;  Surgeon: Fay Records, MD;  Location: [place];  Service: Cardiovascular;  Laterality: N/A;   CARDIOVERSION N/A 11/08/2021    Procedure: CARDIOVERSION;  Surgeon: Pixie Casino, MD;  Location: [facility] ENDOSCOPY;  Service: Cardiovascular;  Laterality: N/A;   CARDIOVERSION N/A 02/06/2022   Procedure: CARDIOVERSION;  Surgeon: Josue Hector, MD;  Location: [facility] ENDOSCOPY;  Service: Cardiovascular;  Laterality: N/A;   CATARACT EXTRACTION W/ INTRAOCULAR LENS  IMPLANT, BILATERAL Bilateral    CATARACT EXTRACTION, BILATERAL  2012   COLONOSCOPY  06/30/2008   no repeats needed    COLONOSCOPY     had 3 or 4 in the past    CORONARY ARTERY BYPASS GRAFT  2007   "CABG X4"   CYST EXCISION PERINEAL  1980s   HAMMER TOE SURGERY Bilateral    JOINT REPLACEMENT     KNEE ARTHROPLASTY  07/30/2011   Procedure: COMPUTER ASSISTED TOTAL KNEE ARTHROPLASTY;  Surgeon: Meredith Pel;  Location: [place];  Service: Orthopedics;  Laterality: Left;  left total knee arthroplasty   MASTECTOMY SUBCUTANEOUS Bilateral    MULTIPLE TOOTH EXTRACTIONS     ORIF FINGER / THUMB FRACTURE Right ~ 1980   "repair of thumb injury"   PROSTATE BIOPSY     REPLACEMENT TOTAL KNEE BILATERAL Bilateral 2012   TEE WITHOUT CARDIOVERSION N/A 03/04/2017   Procedure: TRANSESOPHAGEAL ECHOCARDIOGRAM (TEE);  Surgeon: Burnell Blanks, MD;  Location: [place];  Service: Open Heart Surgery;  Laterality: N/A;   TOTAL KNEE REVISION Right 05/18/2019   Procedure: RIGHT PATELLA REVISION/REMOVAL;  Surgeon: Meredith Pel, MD;  Location: [place];  Service: Orthopedics;  Laterality: Right;   TRANSCATHETER AORTIC VALVE REPLACEMENT, TRANSFEMORAL N/A 03/04/2017   Procedure: TRANSCATHETER AORTIC VALVE REPLACEMENT, TRANSFEMORAL;  Surgeon: Burnell Blanks, MD;  Location: [place];  Service: Open Heart Surgery;  Laterality: N/A;    Current Outpatient Medications  Medication Sig Dispense Refill   allopurinol (ZYLOPRIM) 100 MG tablet Take 1 tablet (100 mg total) by mouth daily. (Patient taking differently: Take 100 mg by mouth daily as needed (Gout).) 90 tablet 3   amiodarone (PACERONE)  200 MG tablet Taking one tablet by mouth daily (Patient taking differently: Take 200 mg by mouth every morning.)     aspirin 81 MG tablet Take 1 tablet (81 mg total) by mouth daily. 30 tablet 0   atorvastatin (LIPITOR) 20 MG tablet TAKE ONE TABLET ONCE DAILY 90 tablet 3   Azelastine HCl 0.15 % SOLN Place 2 sprays into the nose in the morning and at bedtime. 30 mL 11   benzonatate (TESSALON) 200 MG capsule Take 1 capsule (200 mg total) by mouth 2 (two) times daily as needed for cough. 180 capsule 3   cetirizine (ZYRTEC) 10 MG tablet Take 10 mg by mouth 2 (two) times daily. [employer]  colchicine 0.6 MG tablet Take 1 tablet (0.6 mg total) by mouth every 6 (six) hours as needed (gout). 60 tablet 5   dabigatran (PRADAXA) 150 MG CAPS capsule TAKE (1) CAPSULE TWICE DAILY. 180 capsule 3   diphenhydrAMINE (BENADRYL) 25 MG tablet Take 25 mg by mouth at bedtime.      EPINEPHrine 0.3 mg/0.3 mL IJ SOAJ injection Inject 0.3 mLs (0.3 mg total) into the muscle as needed for anaphylaxis. 2 Device 1   famotidine (PEPCID) 20 MG tablet TAKE ONE TABLET BY MOUTH AT BEDTIME. 90 tablet 3   fluorouracil (EFUDEX) 5 % cream Apply 1 application topically 2 (two) times daily as needed (rash).     furosemide (LASIX) 80 MG tablet Take 1 tablet (80 mg total) by mouth daily. 90 tablet 3   HYDROcodone bit-homatropine (HYCODAN) 5-1.5 MG/5ML syrup Take 5 mLs by mouth every 4 (four) hours as needed for cough. 240 mL 0   isosorbide mononitrate (IMDUR) 30 MG 24 hr tablet TAKE ONE TABLET ONCE DAILY 90 tablet 0   ketotifen (ZADITOR) 0.025 % ophthalmic solution Apply 1 drop to eye daily as needed (itchy eyes).      levothyroxine (SYNTHROID) 100 MCG tablet TAKE ONE TABLET BY MOUTH DAILY. 90 tablet 1   meclizine (ANTIVERT) 25 MG tablet Take 1 tablet (25 mg total) by mouth every 4 (four) hours as needed for dizziness. 60 tablet 2   methocarbamol (ROBAXIN) 500 MG tablet Take 1 tablet (500 mg total) by mouth every 8 (eight) hours as  needed for muscle spasms. 30 tablet 0   metoCLOPramide (REGLAN) 10 MG tablet TAKE ONE TABLET BY MOUTH EVERY MORNING WITH BREAKFAST 30 tablet 2   metolazone (ZAROXOLYN) 2.5 MG tablet Take 1 tablet (2.5 mg total) by mouth 2 (two) times a week. Wednesday and Saturday 12 tablet 3   metoprolol succinate (TOPROL-XL) 50 MG 24 hr tablet Take 1.5 tablets (75 mg total) by mouth daily. Take with or immediately following a meal. 135 tablet 3   montelukast (SINGULAIR) 10 MG tablet TAKE 1 TABLET ONCE DAILY IN THE EVENING. 90 tablet 3   Multiple Vitamins-Minerals (PRESERVISION AREDS PO) Take 2 tablets by mouth every morning.     omeprazole (PRILOSEC) 20 MG capsule TAKE (1) CAPSULE TWICE DAILY. 180 capsule 3   potassium chloride SA (KLOR-CON M20) 20 MEQ tablet Take 1 tablet (20 mEq total) by mouth daily. Take 1 tablet (20 mEq total) by mouth daily. (Patient taking differently: Take 20-40 mEq by mouth See admin instructions. Tale 40 meq on Wednesday and Saturday all the other days take 20 meq) 90 tablet 3   sacubitril-valsartan (ENTRESTO) 97-103 MG Take 1 tablet by mouth 2 (two) times daily. 180 tablet 2   spironolactone (ALDACTONE) 25 MG tablet TAKE ONE TABLET BY MOUTH EVERY DAY. 90 tablet 2   triamcinolone cream (KENALOG) 0.1 % Apply 1 application topically daily as needed for dry skin.     valACYclovir (VALTREX) 500 MG tablet Take 500 mg by mouth 2 (two) times daily as needed (cold sores).     No current facility-administered medications for this visit.    Allergies  Allergen Reactions   Peanut-Containing Drug Products Anaphylaxis   Sulfonamide Derivatives Anaphylaxis   Amlodipine Swelling    Swelling in ankles   Eliquis [Apixaban] Other (See Comments)    Back/hip pain   Lisinopril Cough    cough   Xarelto [Rivaroxaban] Other (See Comments)    Back/hip pain     Review of Systems  negative except from HPI and PMH Please see the history of present illness. (+)  All other systems are reviewed and  negative.     Physical Exam    BP 108/60   Pulse 95   Ht 5' 6.5" (1.689 m)   Wt 270 lb 9.6 oz (122.7 kg)   SpO2 97%   BMI 43.02 kg/m  Well developed and Morbidly obese in no acute distress HENT normal Neck supple   Clear Device pocket well healed; without hematoma or erythema.  There is no tethering   Rapid and regular rate and rhythm, no  gallop No  murmur Abd-soft with active BS No Clubbing cyanosis 1-2+ edema Skin-warm and dry A & Oriented  Grossly normal sensory and motor function  ECG atrial flutter with ventricular pacing  Assessment and  Plan  Ventricular tachycardia- prolonged- nonsustained  Sinus node dysfunction  Atrial fibrillation persistent  Atrial tachycardia/flutter-persistent-recurrent  Post TAVR left bundle branch block/IVCD  Cardiomyopathy EF 40-45%  CRT- D [employer]       CHF chronic systolic/diastolic   Morbidly obese  PVCs-infrequent  He continues with PVCs.  Also with recurrent atrial tachycardia and a cycle length of about 280-300 ms.  Successful pace termination.  And ATP algorithms were activated for the atrium.  We will see how effective they are and I have encouraged him to be very attuned to how his symptoms correlate with his heart rate, heart rates in the 80s and 90s would likely represent atrial tachycardia and flutter with more rapid conduction and heart slower heart rates at rest would be sinus rhythm.  Amiodarone has resulted in sinus node suppression and chronotropic incompetence rate response was activated.  Continue his other medications at this time.

## 2022-03-12 NOTE — Telephone Encounter (Signed)
Appt scheduled with Dr [name] today 03/12/2022.  Pt is aware.

## 2022-03-14 ENCOUNTER — Telehealth: Payer: Self-pay | Admitting: Pharmacist

## 2022-03-14 NOTE — Chronic Care Management (AMB) (Signed)
    Chronic Care Management Pharmacy Assistant   Name: BALDEMAR DADY  MRN: [id number] DOB: [birth year]  Reason for Encounter: Reschedule 04/30/2022 appointment. Rescheduled appointment with patient.   [facility] Pharmacist Assistant [phone]

## 2022-03-20 ENCOUNTER — Encounter: Payer: Self-pay | Admitting: Family Medicine

## 2022-03-20 ENCOUNTER — Ambulatory Visit (INDEPENDENT_AMBULATORY_CARE_PROVIDER_SITE_OTHER): Payer: Medicare Other | Admitting: Family Medicine

## 2022-03-20 VITALS — BP 82/50 | HR 82 | Temp 97.8°F | Wt 274.0 lb

## 2022-03-20 DIAGNOSIS — N390 Urinary tract infection, site not specified: Secondary | ICD-10-CM

## 2022-03-20 LAB — POC URINALSYSI DIPSTICK (AUTOMATED)
Bilirubin, UA: NEGATIVE
Glucose, UA: NEGATIVE
Ketones, UA: NEGATIVE
Nitrite, UA: NEGATIVE
Protein, UA: NEGATIVE
Spec Grav, UA: 1.015 (ref 1.010–1.025)
Urobilinogen, UA: 0.2 E.U./dL
pH, UA: 5 (ref 5.0–8.0)

## 2022-03-20 MED ORDER — CIPROFLOXACIN HCL 500 MG PO TABS: 500.0000 mg | ORAL_TABLET | Freq: Two times a day (BID) | ORAL | 0 refills | Status: AC

## 2022-03-20 NOTE — Addendum Note (Signed)
Addended by: Rosalyn Gess D on: 03/20/2022 11:30 AM   Modules accepted: Orders

## 2022-03-20 NOTE — Addendum Note (Signed)
Addended by: Wyvonne Lenz on: 03/20/2022 11:21 AM   Modules accepted: Orders

## 2022-03-20 NOTE — Progress Notes (Signed)
   Subjective:    Patient ID: Justin [name], male    DOB: [birth year], 88 y.o.   MRN: [id number]  HPI Here for one week of urgency to urinate, burning, and low back pain. No fever or nausea. He drinks lots of water every day. On 12-19-21 he was treated for a UTI with Cipro, and the culture grew Klebsiella pneumoniae. His symptoms promptly resolved.    Review of Systems  Constitutional: Negative.   Respiratory: Negative.    Cardiovascular: Negative.   Gastrointestinal: Negative.   Genitourinary:  Positive for dysuria, flank pain, frequency and urgency. Negative for hematuria.       Objective:   Physical Exam Constitutional:      Appearance: Normal appearance. He is not ill-appearing.  Cardiovascular:     Rate and Rhythm: Normal rate and regular rhythm.     Pulses: Normal pulses.     Heart sounds: Normal heart sounds.  Pulmonary:     Effort: Pulmonary effort is normal.     Breath sounds: Normal breath sounds.  Abdominal:     Tenderness: There is no right CVA tenderness or left CVA tenderness.  Neurological:     Mental Status: He is alert.           Assessment & Plan:  UTI, treat with 14 days of Cipro. Culture the sample.  [name], MD

## 2022-03-22 LAB — URINE CULTURE
MICRO NUMBER:: 13697149
SPECIMEN QUALITY:: ADEQUATE

## 2022-03-25 ENCOUNTER — Ambulatory Visit (INDEPENDENT_AMBULATORY_CARE_PROVIDER_SITE_OTHER): Payer: Medicare Other

## 2022-03-25 VITALS — Ht 66.0 in | Wt 270.0 lb

## 2022-03-25 DIAGNOSIS — Z0000 Encounter for general adult medical examination without abnormal findings: Secondary | ICD-10-CM

## 2022-03-25 NOTE — Progress Notes (Signed)
I connected with Justin [name] today by telephone and verified that I am speaking with the correct person using two identifiers. Location patient: home Location provider: work Persons participating in the virtual visit: Hermilo, Dutter LPN.   I discussed the limitations, risks, security and privacy concerns of performing an evaluation and management service by telephone and the availability of in person appointments. I also discussed with the patient that there may be a patient responsible charge related to this service. The patient expressed understanding and verbally consented to this telephonic visit.    Interactive audio and video telecommunications were attempted between this provider and patient, however failed, due to patient having technical difficulties OR patient did not have access to video capability.  We continued and completed visit with audio only.     Vital signs may be patient reported or missing.  Subjective:   Justin [name] is a 88 y.o. male who presents for Medicare Annual/Subsequent preventive examination.  Review of Systems     Cardiac Risk Factors include: advanced age (>24mn, >>77women);hypertension;obesity (BMI >30kg/m2)     Objective:    Today's Vitals   03/25/22 1111  Weight: 270 lb (122.5 kg)  Height: '5\' 6"'$  (1.676 m)   Body mass index is 43.58 kg/m.     03/25/2022   11:19 AM 02/06/2022   11:22 AM 11/08/2021    8:45 AM 04/24/2021   10:20 AM 03/22/2021    9:27 AM 05/19/2019    6:00 AM 05/12/2019   10:14 AM  Advanced Directives  Does Patient Have a Medical Advance Directive? Yes Yes Yes Yes Yes Yes Yes  Type of AParamedicof AAlfredLiving will HOcean BeachLiving will Living will HHarveyLiving will HPecosLiving will HHastyLiving will HLeesburgLiving will  Does patient want to make changes to medical advance  directive?      No - Patient declined No - Patient declined  Copy of HLemon Hillin Chart? Yes - validated most recent copy scanned in chart (See row information) No - copy requested   No - copy requested Yes - validated most recent copy scanned in chart (See row information) Yes - validated most recent copy scanned in chart (See row information)    Current Medications (verified) Outpatient Encounter Medications as of 03/25/2022  Medication Sig   allopurinol (ZYLOPRIM) 100 MG tablet Take 1 tablet (100 mg total) by mouth daily. (Patient taking differently: Take 100 mg by mouth daily as needed (Gout).)   amiodarone (PACERONE) 200 MG tablet Taking one tablet by mouth daily (Patient taking differently: Take 200 mg by mouth every morning.)   aspirin 81 MG tablet Take 1 tablet (81 mg total) by mouth daily.   atorvastatin (LIPITOR) 20 MG tablet TAKE ONE TABLET ONCE DAILY   benzonatate (TESSALON) 200 MG capsule Take 1 capsule (200 mg total) by mouth 2 (two) times daily as needed for cough.   cetirizine (ZYRTEC) 10 MG tablet Take 10 mg by mouth 2 (two) times daily. [facility]   ciprofloxacin (CIPRO) 500 MG tablet Take 1 tablet (500 mg total) by mouth 2 (two) times daily for 14 days.   colchicine 0.6 MG tablet Take 1 tablet (0.6 mg total) by mouth every 6 (six) hours as needed (gout).   dabigatran (PRADAXA) 150 MG CAPS capsule TAKE (1) CAPSULE TWICE DAILY.   diphenhydrAMINE (BENADRYL) 25 MG tablet Take 25 mg by mouth at  bedtime.    EPINEPHrine 0.3 mg/0.3 mL IJ SOAJ injection Inject 0.3 mLs (0.3 mg total) into the muscle as needed for anaphylaxis.   famotidine (PEPCID) 20 MG tablet TAKE ONE TABLET BY MOUTH AT BEDTIME.   fluorouracil (EFUDEX) 5 % cream Apply 1 application topically 2 (two) times daily as needed (rash).   furosemide (LASIX) 80 MG tablet Take 1 tablet (80 mg total) by mouth daily.   HYDROcodone bit-homatropine (HYCODAN) 5-1.5 MG/5ML syrup Take 5 mLs by mouth every 4 (four)  hours as needed for cough.   isosorbide mononitrate (IMDUR) 30 MG 24 hr tablet TAKE ONE TABLET ONCE DAILY   ketotifen (ZADITOR) 0.025 % ophthalmic solution Apply 1 drop to eye daily as needed (itchy eyes).    levothyroxine (SYNTHROID) 100 MCG tablet TAKE ONE TABLET BY MOUTH DAILY.   meclizine (ANTIVERT) 25 MG tablet Take 1 tablet (25 mg total) by mouth every 4 (four) hours as needed for dizziness.   methocarbamol (ROBAXIN) 500 MG tablet Take 1 tablet (500 mg total) by mouth every 8 (eight) hours as needed for muscle spasms.   metoCLOPramide (REGLAN) 10 MG tablet TAKE ONE TABLET BY MOUTH EVERY MORNING WITH BREAKFAST   metolazone (ZAROXOLYN) 2.5 MG tablet Take 1 tablet (2.5 mg total) by mouth 2 (two) times a week. Wednesday and Saturday   metoprolol succinate (TOPROL-XL) 50 MG 24 hr tablet Take 1.5 tablets (75 mg total) by mouth daily. Take with or immediately following a meal.   montelukast (SINGULAIR) 10 MG tablet TAKE 1 TABLET ONCE DAILY IN THE EVENING.   Multiple Vitamins-Minerals (PRESERVISION AREDS PO) Take 2 tablets by mouth every morning.   omeprazole (PRILOSEC) 20 MG capsule TAKE (1) CAPSULE TWICE DAILY.   potassium chloride SA (KLOR-CON M20) 20 MEQ tablet Take 1 tablet (20 mEq total) by mouth daily. Take 1 tablet (20 mEq total) by mouth daily. (Patient taking differently: Take 20-40 mEq by mouth See admin instructions. Tale 40 meq on Wednesday and Saturday all the other days take 20 meq)   sacubitril-valsartan (ENTRESTO) 97-103 MG Take 1 tablet by mouth 2 (two) times daily.   spironolactone (ALDACTONE) 25 MG tablet TAKE ONE TABLET BY MOUTH EVERY DAY.   triamcinolone cream (KENALOG) 0.1 % Apply 1 application topically daily as needed for dry skin.   valACYclovir (VALTREX) 500 MG tablet Take 500 mg by mouth 2 (two) times daily as needed (cold sores).   No facility-administered encounter medications on file as of 03/25/2022.    Allergies (verified) Peanut-containing drug products,  Sulfonamide derivatives, Amlodipine, Eliquis [apixaban], Lisinopril, and Xarelto [rivaroxaban]   History: Past Medical History:  Diagnosis Date   Age-related macular degeneration, dry, both eyes    Allergic    "24/7; 365 days/year; I'm allergic to pollens, dust, all southern grasses/trees, mold, mildue, cats, dogs" (01/07/2017)   Anal fissure    Asthma    sees [name]    Benign prostatic hypertrophy    (sees Dr. Denman George   CAD (coronary artery disease)    a. s/p CABG 2007. b. Cath 08/2016 - 4/4 patent grafts.   Carotid bruit    carotid u/s 10/10: 0.39% bilaterally   Chronic combined systolic and diastolic CHF (congestive heart failure) (HCC)    Complication of anesthesia 1980s   "w/anal cyst OR, he gave me a saddle block then put a narcotic in spinal cord; had a severe reaction to that" (01/07/2017)   Congestive heart failure (CHF) [facility])    ED (erectile dysfunction)  Family history of adverse reaction to anesthesia    "daughter wakes up during OR" (01/07/2017)   GERD (gastroesophageal reflux disease)    Gout    HTN (hypertension)    Hx of colonic polyps    (sees Dr. Henrene Pastor)   Hyperlipidemia    Hypothyroidism    Moderate to severe aortic stenosis    a. s/p TAVR 02/2017.   Myocardial infarction [facility]) ~ 2000   Obesity    Osteoarthritis    "was in my knees, hands" (01/07/2017 )   PAF (paroxysmal atrial fibrillation) ([place])    a. documented post TAVR.   Precancerous skin lesion    (sees Dr. 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   29 mm Edwards Sapien 3 transcatheter heart valve placed via percutaneous right transfemoral approach   Past Surgical History:  Procedure Laterality Date   ANUS SURGERY     "opened it back up cause it wouldn't heal; wound up w/a fissure" (01/07/2017)   BIV ICD INSERTION CRT-D N/A 05/02/2017   Procedure: BIV ICD INSERTION CRT-D;  Surgeon: Deboraha Sprang, MD;   Location: [place] CV LAB;  Service: Cardiovascular;  Laterality: N/A;   CARDIAC CATHETERIZATION  10/29/2005   CARDIAC CATHETERIZATION N/A 08/27/2016   Procedure: Right/Left Heart Cath and Coronary/Graft Angiography;  Surgeon: Burnell Blanks, MD;  Location: [place] CV LAB;  Service: Cardiovascular;  Laterality: N/A;   CARDIOVERSION N/A 04/24/2021   Procedure: CARDIOVERSION;  Surgeon: Fay Records, MD;  Location: [place];  Service: Cardiovascular;  Laterality: N/A;   CARDIOVERSION N/A 11/08/2021   Procedure: CARDIOVERSION;  Surgeon: Pixie Casino, MD;  Location: [facility] ENDOSCOPY;  Service: Cardiovascular;  Laterality: N/A;   CARDIOVERSION N/A 02/06/2022   Procedure: CARDIOVERSION;  Surgeon: Josue Hector, MD;  Location: [facility] ENDOSCOPY;  Service: Cardiovascular;  Laterality: N/A;   CATARACT EXTRACTION W/ INTRAOCULAR LENS  IMPLANT, BILATERAL Bilateral    CATARACT EXTRACTION, BILATERAL  2012   COLONOSCOPY  06/30/2008   no repeats needed    COLONOSCOPY     had 3 or 4 in the past    CORONARY ARTERY BYPASS GRAFT  2007   "CABG X4"   CYST EXCISION PERINEAL  1980s   HAMMER TOE SURGERY Bilateral    JOINT REPLACEMENT     KNEE ARTHROPLASTY  07/30/2011   Procedure: COMPUTER ASSISTED TOTAL KNEE ARTHROPLASTY;  Surgeon: Meredith Pel;  Location: [place];  Service: Orthopedics;  Laterality: Left;  left total knee arthroplasty   MASTECTOMY SUBCUTANEOUS Bilateral    MULTIPLE TOOTH EXTRACTIONS     ORIF FINGER / THUMB FRACTURE Right ~ 1980   "repair of thumb injury"   PROSTATE BIOPSY     REPLACEMENT TOTAL KNEE BILATERAL Bilateral 2012   TEE WITHOUT CARDIOVERSION N/A 03/04/2017   Procedure: TRANSESOPHAGEAL ECHOCARDIOGRAM (TEE);  Surgeon: Burnell Blanks, MD;  Location: [place];  Service: Open Heart Surgery;  Laterality: N/A;   TOTAL KNEE REVISION Right 05/18/2019   Procedure: RIGHT PATELLA REVISION/REMOVAL;  Surgeon: Meredith Pel, MD;  Location: [place];  Service: Orthopedics;   Laterality: Right;   TRANSCATHETER AORTIC VALVE REPLACEMENT, TRANSFEMORAL N/A 03/04/2017   Procedure: TRANSCATHETER AORTIC VALVE REPLACEMENT, TRANSFEMORAL;  Surgeon: Burnell Blanks, MD;  Location: [place];  Service: Open Heart Surgery;  Laterality: N/A;   Family History  Problem Relation Age of Onset   Heart attack Father 63  Allergic rhinitis Father    Asthma Father    Heart failure Mother 23   Uterine cancer Mother    Breast cancer Mother    Colon cancer Neg Hx    Esophageal cancer Neg Hx    Social History   Socioeconomic History   Marital status: Married    Spouse name: Not on file   Number of children: 3   Years of education: Not on file   Highest education level: Not on file  Occupational History   Occupation: Clinical biochemist    Comment: builds malls  Tobacco Use   Smoking status: Former    Packs/day: 3.50    Years: 13.00    Total pack years: 45.50    Types: Cigarettes    Quit date: 1963    Years since quitting: 60.6   Smokeless tobacco: Never   Tobacco comments:    Former smoker 04/19/21  Vaping Use   Vaping Use: Never used  Substance and Sexual Activity   Alcohol use: Not Currently    Alcohol/week: 7.0 standard drinks of alcohol    Types: 7 Cans of beer per week    Comment: 1 beer daily after 5pm (04/19/21)   Drug use: No   Sexual activity: Never  Other Topics Concern   Not on file  Social History Narrative   FH of CAD, Male 1st degree relative less than age 27.   Social Determinants of Health   Financial Resource Strain: Low Risk  (03/25/2022)   Overall Financial Resource Strain (CARDIA)    Difficulty of Paying Living Expenses: Not hard at all  Food Insecurity: No Food Insecurity (03/25/2022)   Hunger Vital Sign    Worried About Running Out of Food in the Last Year: Never true    Ran Out of Food in the Last Year: Never true  Transportation Needs: No Transportation Needs (03/25/2022)   PRAPARE - Hydrologist  (Medical): No    Lack of Transportation (Non-Medical): No  Physical Activity: Inactive (03/25/2022)   Exercise Vital Sign    Days of Exercise per Week: 0 days    Minutes of Exercise per Session: 0 min  Stress: No Stress Concern Present (03/25/2022)   [place]    Feeling of Stress : Not at all  Social Connections: Moderately Isolated (03/22/2021)   Social Connection and Isolation Panel [NHANES]    Frequency of Communication with Friends and Family: Three times a week    Frequency of Social Gatherings with Friends and Family: Three times a week    Attends Religious Services: Never    Active Member of Clubs or Organizations: No    Attends Archivist Meetings: Never    Marital Status: Married    Tobacco Counseling Counseling given: Not Answered Tobacco comments: Former smoker 04/19/21   Clinical Intake:  Pre-visit preparation completed: Yes  Pain : No/denies pain     Nutritional Status: BMI > 30  Obese Nutritional Risks: None Diabetes: No  How often do you need to have someone help you when you read instructions, pamphlets, or other written materials from your doctor or pharmacy?: 1 - Never What is the last grade level you completed in school?: 1/2 of masters  Diabetic? no  Interpreter Needed?: No  Information entered by :: NAllen LPN   Activities of Daily Living    03/25/2022   11:20 AM  In your present state of health, do you have  any difficulty performing the following activities:  Hearing? 0  Vision? 1  Comment macular degeneration  Difficulty concentrating or making decisions? 0  Walking or climbing stairs? 1  Dressing or bathing? 0  Doing errands, shopping? 1  Comment does not drive  Preparing Food and eating ? N  Using the Toilet? N  In the past six months, have you accidently leaked urine? N  Do you have problems with loss of bowel control? N  Managing your Medications? N   Managing your Finances? N  Housekeeping or managing your Housekeeping? N    Patient Care Team: Laurey Morale, MD as PCP - General Angelena Form [name], MD as PCP - Cardiology (Cardiology) Deboraha Sprang, MD as PCP - Electrophysiology (Cardiology) Festus Aloe, MD as Consulting Physician (Urology) Viona Gilmore, [facility] as Pharmacist (Pharmacist)  Indicate any recent Medical Services you may have received from other than [facility] providers in the past year (date may be approximate).     Assessment:   This is a routine wellness examination for [place].  Hearing/Vision screen Vision Screening - Comments:: Regular eye exams, Dr. Posey Pronto, Dr. Ellie Lunch  Dietary issues and exercise activities discussed: Current Exercise Habits: The patient does not participate in regular exercise at present   Goals Addressed             This Visit's Progress    Patient Stated       03/25/2022, wants to lose weight       Depression Screen    03/25/2022   11:20 AM 03/22/2021    9:28 AM 03/22/2021    9:22 AM 03/11/2019    8:04 AM 06/02/2017    9:22 AM 01/17/2017    8:40 AM 12/20/2014   11:03 AM  PHQ 2/9 Scores  PHQ - 2 Score 0 0 0 0 0 0 0    Fall Risk    03/25/2022   11:20 AM 03/27/2021    9:01 AM 03/22/2021    9:28 AM 05/29/2017    8:12 AM 01/17/2017    8:40 AM  Fall Risk   Falls in the past year? 0 1 1 No No  Number falls in past yr: 0 0 0    Injury with Fall? 0 1 1    Risk for fall due to : Medication side effect Impaired vision Impaired balance/gait Impaired balance/gait   Follow up Falls evaluation completed;Education provided;Falls prevention discussed Follow up appointment Falls evaluation completed      FALL RISK PREVENTION PERTAINING TO THE HOME:  Any stairs in or around the home? No  If so, are there any without handrails? N/a Home free of loose throw rugs in walkways, pet beds, electrical cords, etc? Yes  Adequate lighting in your home to reduce risk of falls? Yes    ASSISTIVE DEVICES UTILIZED TO PREVENT FALLS:  Life alert? No  Use of a cane, walker or w/c? No  Grab bars in the bathroom? Yes  Shower chair or bench in shower? No  Elevated toilet seat or a handicapped toilet? Yes   TIMED UP AND GO:  Was the test performed? No .      Cognitive Function:        03/25/2022   11:21 AM  6CIT Screen  What Year? 0 points  What month? 0 points  What time? 0 points  Count back from 20 4 points  Months in reverse 0 points  Repeat phrase 0 points  Total Score 4 points    Immunizations  Immunization History  Administered Date(s) Administered   Fluad Quad(high Dose 65+) 05/06/2019, 05/30/2020, 06/18/2021   Hep A / Hep B 04/06/2012, 10/07/2012   Hepatitis B 05/07/2012   Influenza Split 06/28/2013   Influenza Whole 06/05/2007, 05/13/2008, 06/06/2009, 05/30/2010   Influenza, High Dose Seasonal PF 05/15/2018   Influenza,inj,Quad PF,6+ Mos 05/20/2016   Influenza-Unspecified 05/25/2014, 07/08/2015, 05/06/2017   PFIZER(Purple Top)SARS-COV-2 Vaccination 09/28/2019, 10/12/2019, 05/30/2020   Pneumococcal Conjugate-13 09/18/2015   Pneumococcal Polysaccharide-23 05/13/2008, 05/25/2014   Td 05/30/2010   Tdap 10/27/2020   Zoster Recombinat (Shingrix) 05/18/2018, 10/16/2018   Zoster, Live 10/21/2007    TDAP status: Up to date  Flu Vaccine status: Up to date  Pneumococcal vaccine status: Up to date  Covid-19 vaccine status: Completed vaccines  Qualifies for Shingles Vaccine? Yes   Zostavax completed Yes   Shingrix Completed?: Yes  Screening Tests Health Maintenance  Topic Date Due   COVID-19 Vaccine (4 - Booster for Pfizer series) 07/25/2020   INFLUENZA VACCINE  03/26/2022   TETANUS/TDAP  10/28/2030   Pneumonia Vaccine 1+ Years old  Completed   Zoster Vaccines- Shingrix  Completed   HPV VACCINES  Aged Out    Health Maintenance  Health Maintenance Due  Topic Date Due   COVID-19 Vaccine (4 - Booster for Pfizer series) 07/25/2020     Colorectal cancer screening: No longer required.   Lung Cancer Screening: (Low Dose CT Chest recommended if Age 35-80 years, 30 pack-year currently smoking OR have quit w/in 15years.) does not qualify.   Lung Cancer Screening Referral: no  Additional Screening:  Hepatitis C Screening: does not qualify;   Vision Screening: Recommended annual ophthalmology exams for early detection of glaucoma and other disorders of the eye. Is the patient up to date with their annual eye exam?  Yes  Who is the provider or what is the name of the office in which the patient attends annual eye exams? Dr. Ellie Lunch, Dr. Posey Pronto If pt is not established with a provider, would they like to be referred to a provider to establish care? No .   Dental Screening: Recommended annual dental exams for proper oral hygiene  Community Resource Referral / Chronic Care Management: CRR required this visit?  No   CCM required this visit?  No      Plan:     I have personally reviewed and noted the following in the patient's chart:   Medical and social history Use of alcohol, tobacco or illicit drugs  Current medications and supplements including opioid prescriptions. Patient is currently taking opioid prescriptions. Information provided to patient regarding non-opioid alternatives. Patient advised to discuss non-opioid treatment plan with their provider. Functional ability and status Nutritional status Physical activity Advanced directives List of other physicians Hospitalizations, surgeries, and ER visits in previous 12 months Vitals Screenings to include cognitive, depression, and falls Referrals and appointments  In addition, I have reviewed and discussed with patient certain preventive protocols, quality metrics, and best practice recommendations. A written personalized care plan for preventive services as well as general preventive health recommendations were provided to patient.     Kellie Simmering,  LPN   [id number]   Nurse Notes: none  Due to this being a virtual visit, the after visit summary with patients personalized plan was offered to patient via mail or my-chart.  Patient would like to access on my-chart

## 2022-03-25 NOTE — Patient Instructions (Signed)
Justin [name] , Thank you for taking time to come for your Medicare Wellness Visit. I appreciate your ongoing commitment to your health goals. Please review the following plan we discussed and let me know if I can assist you in the future.   Screening recommendations/referrals: Colonoscopy: not required Recommended yearly ophthalmology/optometry visit for glaucoma screening and checkup Recommended yearly dental visit for hygiene and checkup  Vaccinations: Influenza vaccine: due 03/26/2022 Pneumococcal vaccine: completed 09/18/2015 Tdap vaccine: completed 10/27/2020, due 10/28/2030 Shingles vaccine: completed    Covid-19:  05/30/2020, 10/12/2019, 09/28/2019  Advanced directives: copy in chart  Conditions/risks identified: none  Next appointment: Follow up in one year for your annual wellness visit.   Preventive Care 86 Years and Older, Male Preventive care refers to lifestyle choices and visits with your health care provider that can promote health and wellness. What does preventive care include? A yearly physical exam. This is also called an annual well check. Dental exams once or twice a year. Routine eye exams. Ask your health care provider how often you should have your eyes checked. Personal lifestyle choices, including: Daily care of your teeth and gums. Regular physical activity. Eating a healthy diet. Avoiding tobacco and drug use. Limiting alcohol use. Practicing safe sex. Taking low doses of aspirin every day. Taking vitamin and mineral supplements as recommended by your health care provider. What happens during an annual well check? The services and screenings done by your health care provider during your annual well check will depend on your age, overall health, lifestyle risk factors, and family history of disease. Counseling  Your health care provider may ask you questions about your: Alcohol use. Tobacco use. Drug use. Emotional well-being. Home and relationship  well-being. Sexual activity. Eating habits. History of falls. Memory and ability to understand (cognition). Work and work Statistician. Screening  You may have the following tests or measurements: Height, weight, and BMI. Blood pressure. Lipid and cholesterol levels. These may be checked every 5 years, or more frequently if you are over 44 years old. Skin check. Lung cancer screening. You may have this screening every year starting at age 43 if you have a 30-pack-year history of smoking and currently smoke or have quit within the past 15 years. Fecal occult blood test (FOBT) of the stool. You may have this test every year starting at age 75. Flexible sigmoidoscopy or colonoscopy. You may have a sigmoidoscopy every 5 years or a colonoscopy every 10 years starting at age 89. Prostate cancer screening. Recommendations will vary depending on your family history and other risks. Hepatitis C blood test. Hepatitis B blood test. Sexually transmitted disease (STD) testing. Diabetes screening. This is done by checking your blood sugar (glucose) after you have not eaten for a while (fasting). You may have this done every 1-3 years. Abdominal aortic aneurysm (AAA) screening. You may need this if you are a current or former smoker. Osteoporosis. You may be screened starting at age 58 if you are at high risk. Talk with your health care provider about your test results, treatment options, and if necessary, the need for more tests. Vaccines  Your health care provider may recommend certain vaccines, such as: Influenza vaccine. This is recommended every year. Tetanus, diphtheria, and acellular pertussis (Tdap, Td) vaccine. You may need a Td booster every 10 years. Zoster vaccine. You may need this after age 57. Pneumococcal 13-valent conjugate (PCV13) vaccine. One dose is recommended after age 31. Pneumococcal polysaccharide (PPSV23) vaccine. One dose is recommended after age 59.  Talk to your health care  provider about which screenings and vaccines you need and how often you need them. This information is not intended to replace advice given to you by your health care provider. Make sure you discuss any questions you have with your health care provider. Document Released: 09/08/2015 Document Revised: 05/01/2016 Document Reviewed: 06/13/2015 Elsevier Interactive Patient Education  2017 [place] Prevention in the Home Falls can cause injuries. They can happen to people of all ages. There are many things you can do to make your home safe and to help prevent falls. What can I do on the outside of my home? Regularly fix the edges of walkways and driveways and fix any cracks. Remove anything that might make you trip as you walk through a door, such as a raised step or threshold. Trim any bushes or trees on the path to your home. Use bright outdoor lighting. Clear any walking paths of anything that might make someone trip, such as rocks or tools. Regularly check to see if handrails are loose or broken. Make sure that both sides of any steps have handrails. Any raised decks and porches should have guardrails on the edges. Have any leaves, snow, or ice cleared regularly. Use sand or salt on walking paths during winter. Clean up any spills in your garage right away. This includes oil or grease spills. What can I do in the bathroom? Use night lights. Install grab bars by the toilet and in the tub and shower. Do not use towel bars as grab bars. Use non-skid mats or decals in the tub or shower. If you need to sit down in the shower, use a plastic, non-slip stool. Keep the floor dry. Clean up any water that spills on the floor as soon as it happens. Remove soap buildup in the tub or shower regularly. Attach bath mats securely with double-sided non-slip rug tape. Do not have throw rugs and other things on the floor that can make you trip. What can I do in the bedroom? Use night lights. Make  sure that you have a light by your bed that is easy to reach. Do not use any sheets or blankets that are too big for your bed. They should not hang down onto the floor. Have a firm chair that has side arms. You can use this for support while you get dressed. Do not have throw rugs and other things on the floor that can make you trip. What can I do in the kitchen? Clean up any spills right away. Avoid walking on wet floors. Keep items that you use a lot in easy-to-reach places. If you need to reach something above you, use a strong step stool that has a grab bar. Keep electrical cords out of the way. Do not use floor polish or wax that makes floors slippery. If you must use wax, use non-skid floor wax. Do not have throw rugs and other things on the floor that can make you trip. What can I do with my stairs? Do not leave any items on the stairs. Make sure that there are handrails on both sides of the stairs and use them. Fix handrails that are broken or loose. Make sure that handrails are as long as the stairways. Check any carpeting to make sure that it is firmly attached to the stairs. Fix any carpet that is loose or worn. Avoid having throw rugs at the top or bottom of the stairs. If you do have throw  rugs, attach them to the floor with carpet tape. Make sure that you have a light switch at the top of the stairs and the bottom of the stairs. If you do not have them, ask someone to add them for you. What else can I do to help prevent falls? Wear shoes that: Do not have high heels. Have rubber bottoms. Are comfortable and fit you well. Are closed at the toe. Do not wear sandals. If you use a stepladder: Make sure that it is fully opened. Do not climb a closed stepladder. Make sure that both sides of the stepladder are locked into place. Ask someone to hold it for you, if possible. Clearly mark and make sure that you can see: Any grab bars or handrails. First and last steps. Where the  edge of each step is. Use tools that help you move around (mobility aids) if they are needed. These include: Canes. Walkers. Scooters. Crutches. Turn on the lights when you go into a dark area. Replace any light bulbs as soon as they burn out. Set up your furniture so you have a clear path. Avoid moving your furniture around. If any of your floors are uneven, fix them. If there are any pets around you, be aware of where they are. Review your medicines with your doctor. Some medicines can make you feel dizzy. This can increase your chance of falling. Ask your doctor what other things that you can do to help prevent falls. This information is not intended to replace advice given to you by your health care provider. Make sure you discuss any questions you have with your health care provider. Document Released: 06/08/2009 Document Revised: 01/18/2016 Document Reviewed: 09/16/2014 Elsevier Interactive Patient Education  2017 [employer].

## 2022-03-29 ENCOUNTER — Other Ambulatory Visit: Payer: Medicare Other

## 2022-04-01 ENCOUNTER — Other Ambulatory Visit: Payer: Medicare Other

## 2022-04-01 DIAGNOSIS — I480 Paroxysmal atrial fibrillation: Secondary | ICD-10-CM | POA: Diagnosis not present

## 2022-04-01 DIAGNOSIS — I5022 Chronic systolic (congestive) heart failure: Secondary | ICD-10-CM | POA: Diagnosis not present

## 2022-04-01 DIAGNOSIS — H353221 Exudative age-related macular degeneration, left eye, with active choroidal neovascularization: Secondary | ICD-10-CM | POA: Diagnosis not present

## 2022-04-01 LAB — BASIC METABOLIC PANEL
BUN/Creatinine Ratio: 23 (ref 10–24)
BUN: 36 mg/dL — ABNORMAL HIGH (ref 8–27)
CO2: 23 mmol/L (ref 20–29)
Calcium: 8.5 mg/dL — ABNORMAL LOW (ref 8.6–10.2)
Chloride: 100 mmol/L (ref 96–106)
Creatinine, Ser: 1.58 mg/dL — ABNORMAL HIGH (ref 0.76–1.27)
Glucose: 100 mg/dL — ABNORMAL HIGH (ref 70–99)
Potassium: 4.5 mmol/L (ref 3.5–5.2)
Sodium: 135 mmol/L (ref 134–144)
eGFR: 43 mL/min/{1.73_m2} — ABNORMAL LOW (ref >59)

## 2022-04-02 ENCOUNTER — Encounter: Payer: Medicare Other | Admitting: Physician Assistant

## 2022-04-08 ENCOUNTER — Ambulatory Visit: Payer: Medicare Other

## 2022-04-09 LAB — CUP PACEART REMOTE DEVICE CHECK
Battery Remaining Longevity: 11 mo
Battery Voltage: 2.88 V
Brady Statistic AP VP Percent: 72.75 %
Brady Statistic AP VS Percent: 1.79 %
Brady Statistic AS VP Percent: 24.56 %
Brady Statistic AS VS Percent: 0.9 %
Brady Statistic RA Percent Paced: 67.75 %
Brady Statistic RV Percent Paced: 79.08 %
Date Time Interrogation Session: 20230814062204
HighPow Impedance: 78 Ohm
Implantable Lead Implant Date: 20180907
Implantable Lead Implant Date: 20180907
Implantable Lead Implant Date: 20180907
Implantable Lead Location: 753858
Implantable Lead Location: 753859
Implantable Lead Location: 753860
Implantable Lead Model: 4396
Implantable Lead Model: 5076
Implantable Pulse Generator Implant Date: 20180907
Lead Channel Impedance Value: 342 Ohm
Lead Channel Impedance Value: 342 Ohm
Lead Channel Impedance Value: 399 Ohm
Lead Channel Impedance Value: 532 Ohm
Lead Channel Impedance Value: 627 Ohm
Lead Channel Impedance Value: 950 Ohm
Lead Channel Pacing Threshold Amplitude: 0.875 V
Lead Channel Pacing Threshold Amplitude: 1 V
Lead Channel Pacing Threshold Amplitude: 1.125 V
Lead Channel Pacing Threshold Pulse Width: 0.4 ms
Lead Channel Pacing Threshold Pulse Width: 0.4 ms
Lead Channel Pacing Threshold Pulse Width: 1 ms
Lead Channel Sensing Intrinsic Amplitude: 1.125 mV
Lead Channel Sensing Intrinsic Amplitude: 1.125 mV
Lead Channel Sensing Intrinsic Amplitude: 8.25 mV
Lead Channel Sensing Intrinsic Amplitude: 8.25 mV
Lead Channel Setting Pacing Amplitude: 1.75 V
Lead Channel Setting Pacing Amplitude: 2 V
Lead Channel Setting Pacing Amplitude: 2.5 V
Lead Channel Setting Pacing Pulse Width: 0.4 ms
Lead Channel Setting Pacing Pulse Width: 1 ms
Lead Channel Setting Sensing Sensitivity: 0.3 mV

## 2022-04-10 ENCOUNTER — Ambulatory Visit (INDEPENDENT_AMBULATORY_CARE_PROVIDER_SITE_OTHER): Payer: Medicare Other

## 2022-04-10 DIAGNOSIS — Z95810 Presence of automatic (implantable) cardiac defibrillator: Secondary | ICD-10-CM | POA: Diagnosis not present

## 2022-04-10 DIAGNOSIS — I5042 Chronic combined systolic (congestive) and diastolic (congestive) heart failure: Secondary | ICD-10-CM | POA: Diagnosis not present

## 2022-04-10 NOTE — Progress Notes (Signed)
EPIC Encounter for ICM Monitoring  Patient Name: Justin [name] is a 88 y.o. male Date: 04/10/2022 Primary Care Physican: Laurey Morale, MD Primary Cardiologist: Angelena Form Electrophysiologist: Vergie Living Pacing: 82.4% Effective 80.9%   (% of Time Since 06-Feb-2022) 11/26/2021 Weight:   267 lbs 12/11/2021 Weight: 267 lbs 12/17/2021 Weight: No current weight 01/08/2022 Weight: 260 lbs   Clinical Status Since 12-Mar-2022 Treated AT/AF 7  Monitored AT/AF 1 Time in AT/AF <0.1 hr/day (0.3%) Longest AT/AF 73 minutes                                Spoke with patient and heart failure questions reviewed.  Pt reports he has been eating more salt lately and can tell he has some fluid.  He reports Dr [name] advised him take extra fluid pills when needed.     Optivol thoracic impedance suggesting normal fluid levels.  Report showing 7 ATP episodes.  Message sent to device clinic triage to review ATP episodes and follow up.    Prescribed: Furosemide 80 mg take 1 tablet every day.   Metolazone 2.5 mg Take 1 tablet by mouth twice a week Wednesday and Saturday (increased on 3/29). KLC 20 mEq Take 1 tablet (20 mEq total) by mouth daily.   Per 4/14 phone note, decrease K+ to 1 tablet with Metolazone Spironolactone 25 mg take 1 tablet daily   Labs:  01/15/2022 Creatinine 1.48, BUN 40, Potassium 5.1, Sodium 135, GFR 46 01/01/2022 Creatinine 1.60, BUN 47, Potassium 4.6, Sodium 132, GFR 42 12/21/2021 Creatinine 1.81, BUN 51, Potassium 5.1, Sodium 135, GFR 36 12/05/2021 Creatinine 1.34, BUN 36, Potassium 5.3, Sodium 136, GFR 52 11/08/2021 Creatinine 1.30, BUN 33, Potassium 4.3, Sodium 138 11/02/2021 Creatinine 1.30, BUN 39, Potassium 4.7, Sodium 136, GFR 54  10/23/2021 Creatinine 1.29, BUN 31, Potassium 5.3, Sodium 134, GFR 54  A complete set of results can be found in Results Review.   Recommendations:  He will take extra Fluid pill for 2 days as discussed with Dr [name] at last office visit.     Follow-up plan: ICM clinic phone appointment on 05/13/2022.   91 day device clinic remote transmission 07/08/2022.     EP/Cardiology Office Visits:  07/03/2022 with Dr Angelena Form.  05/24/2022 with Dr [name].   Copy of ICM check sent to Dr. [name].   3 month ICM trend: 04/08/2022.    12-14 Month ICM trend:     [name], RN 04/10/2022 4:00 PM

## 2022-04-10 NOTE — Progress Notes (Signed)
Message received from device clinic.  Received: Today Wanda Plump, RN  Hiyab Nhem Panda, RN We do not call for AF treated with ATP

## 2022-04-15 ENCOUNTER — Other Ambulatory Visit: Payer: Self-pay | Admitting: Family Medicine

## 2022-04-16 ENCOUNTER — Other Ambulatory Visit: Payer: Self-pay | Admitting: Family Medicine

## 2022-04-19 ENCOUNTER — Telehealth (INDEPENDENT_AMBULATORY_CARE_PROVIDER_SITE_OTHER): Payer: Medicare Other | Admitting: Family Medicine

## 2022-04-19 ENCOUNTER — Encounter: Payer: Self-pay | Admitting: Family Medicine

## 2022-04-19 DIAGNOSIS — R0989 Other specified symptoms and signs involving the circulatory and respiratory systems: Secondary | ICD-10-CM

## 2022-04-19 DIAGNOSIS — U071 COVID-19: Secondary | ICD-10-CM

## 2022-04-19 LAB — POC COVID19 BINAXNOW: SARS Coronavirus 2 Ag: POSITIVE — AB

## 2022-04-19 LAB — POCT RAPID STREP A (OFFICE): Rapid Strep A Screen: NEGATIVE

## 2022-04-19 LAB — POCT INFLUENZA A/B
Influenza A, POC: NEGATIVE
Influenza B, POC: NEGATIVE

## 2022-04-19 MED ORDER — MOLNUPIRAVIR EUA 200MG CAPSULE: 4.0000 | ORAL_CAPSULE | Freq: Two times a day (BID) | ORAL | 0 refills | Status: AC

## 2022-04-19 NOTE — Progress Notes (Signed)
Virtual Visit via Telephone Note  I connected with Justin [name] on 04/19/22 at  3:30 PM EDT by telephone and verified that I am speaking with the correct person using two identifiers.   I discussed the limitations, risks, security and privacy concerns of performing an evaluation and management service by telephone and the availability of in person appointments. I also discussed with the patient that there may be a patient responsible charge related to this service. The patient expressed understanding and agreed to proceed.  Location patient: home Location provider: work or home office Participants present for the call: patient, provider Patient did not have a visit in the prior 7 days to address this/these issue(s).  Chief Complaint  Patient presents with   Covid Positive    Cough and chest congestion starting to break up. Started Wednesday, last night took some codeine cough syrup, and is now able to cough up mucus. Also feels very tired. No fever.     History of Present Illness: Pt is an 88 year old male with pmh sig for CAD s/p CABG x4, HTN, chronic combined heart failure, aortic stenosis s/p TAVR, atrial flutter, NICM with ICD in place, history of MI, GERD, hypothyroidism, history of prostate cancer who is followed by Dr. Sarajane Jews and seen for acute concern.   Patient developed a cough on Wednesday.  Pt took leftover hycodan cough syrup.  Pt with fatigue, dizziness, raspy voice with cough.  Appetite is fine.   Denies HA, fever, n/v, loose stools, or sick contacts.  Pt had all the COVID vaccines and boosters   Observations/Objective: Patient sounds cheerful and well on the phone. I do not appreciate any SOB. Speech and thought processing are grossly intact. Patient reported vitals:  Assessment and Plan: COVID-19 virus infection  -symptoms starting on Wed 04/17/22 with positive COVID testing today 04/19/2022 in clinic -Discussed r/b/a of antiviral medications.  Patient wishes to  start medication -Rx for molnupiravir sent to pharmacy. -Continue expectant management of symptoms with rest, hydration, OTC medications, etc. -Given strict precautions - Plan: molnupiravir EUA (LAGEVRIO) 200 mg CAPS capsule  Chest congestion -COVID testing positive -Strep, influenza testing negative.  - Plan: POC COVID-19, POC Influenza A/B, POCT rapid strep A   Follow Up Instructions:  F/u with pcp prn   [id number] 5-10 [id number] 11-20 [id number] 21-30 I did not refer this patient for an OV in the next 24 hours for this/these issue(s).  I discussed the assessment and treatment plan with the patient. The patient was provided an opportunity to ask questions and all were answered. The patient agreed with the plan and demonstrated an understanding of the instructions.   The patient was advised to call back or seek an in-person evaluation if the symptoms worsen or if the condition fails to improve as anticipated.  I provided 7:38 minutes of non-face-to-face time during this encounter.   Billie Ruddy, MD

## 2022-04-22 ENCOUNTER — Ambulatory Visit: Payer: Medicare Other | Admitting: Internal Medicine

## 2022-04-30 ENCOUNTER — Telehealth: Payer: Medicare Other

## 2022-05-03 ENCOUNTER — Other Ambulatory Visit: Payer: Self-pay | Admitting: Family Medicine

## 2022-05-06 ENCOUNTER — Other Ambulatory Visit: Payer: Self-pay | Admitting: Cardiovascular Disease

## 2022-05-10 ENCOUNTER — Other Ambulatory Visit: Payer: Self-pay | Admitting: Family Medicine

## 2022-05-13 ENCOUNTER — Ambulatory Visit (INDEPENDENT_AMBULATORY_CARE_PROVIDER_SITE_OTHER): Payer: Medicare Other

## 2022-05-13 DIAGNOSIS — I5042 Chronic combined systolic (congestive) and diastolic (congestive) heart failure: Secondary | ICD-10-CM | POA: Diagnosis not present

## 2022-05-13 DIAGNOSIS — Z95810 Presence of automatic (implantable) cardiac defibrillator: Secondary | ICD-10-CM | POA: Diagnosis not present

## 2022-05-16 ENCOUNTER — Telehealth: Payer: Self-pay | Admitting: Pharmacist

## 2022-05-16 NOTE — Chronic Care Management (AMB) (Signed)
    Chronic Care Management Pharmacy Assistant   Name: HUBERT RAATZ  MRN: [id number] DOB: 09-13-1933  Reason for Encounter: Reschedule 07/01/2022 appointment with [name], Clinical Pharmacist.    Rescheduled appointment with patient to 07/17/2022.  [place] Pharmacist Assistant [phone]

## 2022-05-17 ENCOUNTER — Telehealth: Payer: Self-pay

## 2022-05-17 NOTE — Progress Notes (Signed)
EPIC Encounter for ICM Monitoring  Patient Name: Justin [name] is a 88 y.o. male Date: 05/17/2022 Primary Care Physican: Laurey Morale, MD Primary Cardiologist: Angelena Form Electrophysiologist: Vergie Living Pacing: 82.1% Effective 76.3%   (% of Time Since 08-Apr-2022) 01/08/2022 Weight: 260 lbs   Clinical Status  Since 08-Apr-2022 Treated AT/AF   22   Monitored AT/AF         33 Time in AT/AF  0.6 hr/day (2.6%) Longest AT/AF 9 hours                                Attempted call to patient and unable to reach.  Left detailed message per DPR regarding transmission. Transmission reviewed.    Optivol thoracic impedance suggesting normal fluid levels.  Device clinic does not follow up on ATP treated episodes.   Prescribed: Furosemide 80 mg take 1 tablet every day.   Metolazone 2.5 mg Take 1 tablet by mouth twice a week Wednesday and Saturday (increased on 3/29). KLC 20 mEq Take 1 tablet (20 mEq total) by mouth daily.   Per 4/14 phone note, decrease K+ to 1 tablet with Metolazone Spironolactone 25 mg take 1 tablet daily   Labs:  01/15/2022 Creatinine 1.48, BUN 40, Potassium 5.1, Sodium 135, GFR 46 01/01/2022 Creatinine 1.60, BUN 47, Potassium 4.6, Sodium 132, GFR 42 12/21/2021 Creatinine 1.81, BUN 51, Potassium 5.1, Sodium 135, GFR 36 12/05/2021 Creatinine 1.34, BUN 36, Potassium 5.3, Sodium 136, GFR 52 11/08/2021 Creatinine 1.30, BUN 33, Potassium 4.3, Sodium 138 11/02/2021 Creatinine 1.30, BUN 39, Potassium 4.7, Sodium 136, GFR 54  10/23/2021 Creatinine 1.29, BUN 31, Potassium 5.3, Sodium 134, GFR 54  A complete set of results can be found in Results Review.   Recommendations:  Left voice mail with ICM number and encouraged to call if experiencing any fluid symptoms.    Follow-up plan: ICM clinic phone appointment on 06/17/2022.   91 day device clinic remote transmission 07/08/2022.     EP/Cardiology Office Visits:  07/03/2022 with Dr Angelena Form.  05/24/2022 with Dr [name].   Copy of  ICM check sent to Dr. [name].    3 month ICM trend: 05/13/2022.    12-14 Month ICM trend:     [name], RN 05/17/2022 2:29 PM

## 2022-05-17 NOTE — Telephone Encounter (Signed)
Remote ICM transmission received.  Attempted call to patient regarding ICM remote transmission and left detailed message per DPR.  Advised to return call for any fluid symptoms or questions. Next ICM remote transmission scheduled 06/17/2022.    

## 2022-05-20 DIAGNOSIS — H353221 Exudative age-related macular degeneration, left eye, with active choroidal neovascularization: Secondary | ICD-10-CM | POA: Diagnosis not present

## 2022-05-23 DIAGNOSIS — I4729 Other ventricular tachycardia: Secondary | ICD-10-CM | POA: Insufficient documentation

## 2022-05-23 DIAGNOSIS — I495 Sick sinus syndrome: Secondary | ICD-10-CM | POA: Insufficient documentation

## 2022-05-23 DIAGNOSIS — I493 Ventricular premature depolarization: Secondary | ICD-10-CM | POA: Insufficient documentation

## 2022-05-24 ENCOUNTER — Ambulatory Visit: Payer: Medicare Other | Attending: Internal Medicine | Admitting: Internal Medicine

## 2022-05-24 VITALS — BP 110/66 | HR 78 | Ht 66.0 in | Wt 276.4 lb

## 2022-05-24 DIAGNOSIS — I495 Sick sinus syndrome: Secondary | ICD-10-CM | POA: Diagnosis not present

## 2022-05-24 DIAGNOSIS — I493 Ventricular premature depolarization: Secondary | ICD-10-CM

## 2022-05-24 DIAGNOSIS — I4819 Other persistent atrial fibrillation: Secondary | ICD-10-CM | POA: Diagnosis not present

## 2022-05-24 DIAGNOSIS — I5042 Chronic combined systolic (congestive) and diastolic (congestive) heart failure: Secondary | ICD-10-CM | POA: Diagnosis not present

## 2022-05-24 DIAGNOSIS — Z79899 Other long term (current) drug therapy: Secondary | ICD-10-CM

## 2022-05-24 DIAGNOSIS — I484 Atypical atrial flutter: Secondary | ICD-10-CM

## 2022-05-24 DIAGNOSIS — I4729 Other ventricular tachycardia: Secondary | ICD-10-CM

## 2022-05-24 NOTE — Patient Instructions (Signed)
Medication Instructions:  Your physician recommends that you continue on your current medications as directed. Please refer to the Current Medication list given to you today.  *If you need a refill on your cardiac medications before your next appointment, please call your pharmacy*   Lab Work: TSH and Liver Panel today If you have labs (blood work) drawn today and your tests are completely normal, you will receive your results only by: MyChart Message (if you have MyChart) OR A paper copy in the mail If you have any lab test that is abnormal or we need to change your treatment, we will call you to review the results.   Testing/Procedures: None ordered.    Follow-Up: At [facility], you and your health needs are our priority.  As part of our continuing mission to provide you with exceptional heart care, we have created designated Provider Care Teams.  These Care Teams include your primary Cardiologist (physician) and Advanced Practice Providers (APPs -  Physician Assistants and Nurse Practitioners) who all work together to provide you with the care you need, when you need it.  We recommend signing up for the patient portal called "MyChart".  Sign up information is provided on this After Visit Summary.  MyChart is used to connect with patients for Virtual Visits (Telemedicine).  Patients are able to view lab/test results, encounter notes, upcoming appointments, etc.  Non-urgent messages can be sent to your provider as well.   To learn more about what you can do with MyChart, go to [email or web address].    Your next appointment:   6 months with Dr [name]  Important Information About Sugar

## 2022-05-24 NOTE — Progress Notes (Signed)
Patient Care Team: Laurey Morale, MD as PCP - General Angelena Form [name], MD as PCP - Cardiology (Cardiology) Deboraha Sprang, MD as PCP - Electrophysiology (Cardiology) Festus Aloe, MD as Consulting Physician (Urology) Viona Gilmore, [facility] as Pharmacist (Pharmacist)   HPI  Justin [name] is a 88 y.o. male Seen for follow-up of a CRT ICD 9/18 implanted because of syncope  and new left bundle branch block following TAVR ; hx of Afib on dabigitran and ASA;  intercurrent Afib with DCCV 9/22   Initially significantly improved following CRT.  Now with repeated issues with exercise intolerance.  Some edema.  No chest pain.  Interval atrial fibrillation requiring repeat cardioversion 3/16 not associated with significant symptoms  Significant PVC burden which is affecting his CRT pacing percentage.  Repeat echo as noted below was surprisingly good with some interval improvement (may be)   Repeat cardioversion 3/23 and then started on amiodarone.  Seen 6/23 with persistent atrial flutter for about 2 months and then submitted for cardioversion.  When seen 2 weeks later, he was again out of rhythm.  7/23 seen in the office recurrent atrial tachycardia/flutter.  Underwent in office pace termination and ATP algorithms were activated. No appreciable change in symptoms.  He has been doing great.  Has scant samples.  Some variable shortness of breath.  No edema.  No chest pain.  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     DATE TEST EF   1/18 LHC  Patent grafts  8/18 Echo   40-45 %   6/19 Echo   40-45 %   9/20 Echo  35-40%   1/23 Echo 40-45%     DATE Cr K Hgb TSH LFTs  11/22  1.08 4.7 13.0(8/22)      3/23 1.3 4.3 14.6 4.72(4/23) 9(4/23)  8/23 1.58 4.5 13.2              Past Medical History:  Diagnosis Date   Age-related macular degeneration, dry, both eyes    Allergic    "24/7; 365 days/year; I'm allergic to pollens, dust, all southern grasses/trees, mold,  mildue, cats, dogs" (01/07/2017)   Anal fissure    Asthma    sees [name]    Benign prostatic hypertrophy    (sees Dr. Denman George   CAD (coronary artery disease)    a. s/p CABG 2007. b. Cath 08/2016 - 4/4 patent grafts.   Carotid bruit    carotid u/s 10/10: 0.39% bilaterally   Chronic combined systolic and diastolic CHF (congestive heart failure) (HCC)    Complication of anesthesia 1980s   "w/anal cyst OR, he gave me a saddle block then put a narcotic in spinal cord; had a severe reaction to that" (01/07/2017)   Congestive heart failure (CHF) [facility])    ED (erectile dysfunction)    Family history of adverse reaction to anesthesia    "daughter wakes up during OR" (01/07/2017)   GERD (gastroesophageal reflux disease)    Gout    HTN (hypertension)    Hx of colonic polyps    (sees Dr. Henrene Pastor)   Hyperlipidemia    Hypothyroidism    Moderate to severe aortic stenosis    a. s/p TAVR 02/2017.   Myocardial infarction [facility]) ~ 2000   Obesity    Osteoarthritis    "was in my knees, hands" (01/07/2017 )   PAF (paroxysmal atrial fibrillation) ([place])    a. documented post TAVR.   Precancerous skin lesion    (  sees Dr. 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   29 mm Edwards Sapien 3 transcatheter heart valve placed via percutaneous right transfemoral approach    Past Surgical History:  Procedure Laterality Date   ANUS SURGERY     "opened it back up cause it wouldn't heal; wound up w/a fissure" (01/07/2017)   BIV ICD INSERTION CRT-D N/A 05/02/2017   Procedure: BIV ICD INSERTION CRT-D;  Surgeon: Deboraha Sprang, MD;  Location: [place] CV LAB;  Service: Cardiovascular;  Laterality: N/A;   CARDIAC CATHETERIZATION  10/29/2005   CARDIAC CATHETERIZATION N/A 08/27/2016   Procedure: Right/Left Heart Cath and Coronary/Graft Angiography;  Surgeon: Burnell Blanks, MD;  Location: [place] [facility];  Service:  Cardiovascular;  Laterality: N/A;   CARDIOVERSION N/A 04/24/2021   Procedure: CARDIOVERSION;  Surgeon: Fay Records, MD;  Location: [place];  Service: Cardiovascular;  Laterality: N/A;   CARDIOVERSION N/A 11/08/2021   Procedure: CARDIOVERSION;  Surgeon: Pixie Casino, MD;  Location: [facility] ENDOSCOPY;  Service: Cardiovascular;  Laterality: N/A;   CARDIOVERSION N/A 02/06/2022   Procedure: CARDIOVERSION;  Surgeon: Josue Hector, MD;  Location: [name] ENDOSCOPY;  Service: Cardiovascular;  Laterality: N/A;   CATARACT EXTRACTION W/ INTRAOCULAR LENS  IMPLANT, BILATERAL Bilateral    CATARACT EXTRACTION, BILATERAL  2012   COLONOSCOPY  06/30/2008   no repeats needed    COLONOSCOPY     had 3 or 4 in the past    CORONARY ARTERY BYPASS GRAFT  2007   "CABG X4"   CYST EXCISION PERINEAL  1980s   HAMMER TOE SURGERY Bilateral    JOINT REPLACEMENT     KNEE ARTHROPLASTY  07/30/2011   Procedure: COMPUTER ASSISTED TOTAL KNEE ARTHROPLASTY;  Surgeon: Meredith Pel;  Location: [place];  Service: Orthopedics;  Laterality: Left;  left total knee arthroplasty   MASTECTOMY SUBCUTANEOUS Bilateral    MULTIPLE TOOTH EXTRACTIONS     ORIF FINGER / THUMB FRACTURE Right ~ 1980   "repair of thumb injury"   PROSTATE BIOPSY     REPLACEMENT TOTAL KNEE BILATERAL Bilateral 2012   TEE WITHOUT CARDIOVERSION N/A 03/04/2017   Procedure: TRANSESOPHAGEAL ECHOCARDIOGRAM (TEE);  Surgeon: Burnell Blanks, MD;  Location: [place];  Service: Open Heart Surgery;  Laterality: N/A;   TOTAL KNEE REVISION Right 05/18/2019   Procedure: RIGHT PATELLA REVISION/REMOVAL;  Surgeon: Meredith Pel, MD;  Location: [place];  Service: Orthopedics;  Laterality: Right;   TRANSCATHETER AORTIC VALVE REPLACEMENT, TRANSFEMORAL N/A 03/04/2017   Procedure: TRANSCATHETER AORTIC VALVE REPLACEMENT, TRANSFEMORAL;  Surgeon: Burnell Blanks, MD;  Location: [place];  Service: Open Heart Surgery;  Laterality: N/A;    Current Outpatient Medications   Medication Sig Dispense Refill   allopurinol (ZYLOPRIM) 100 MG tablet Take 1 tablet (100 mg total) by mouth daily. (Patient taking differently: Take 100 mg by mouth daily as needed (Gout).) 90 tablet 3   amiodarone (PACERONE) 200 MG tablet Taking one tablet by mouth daily (Patient taking differently: Take 200 mg by mouth every morning.)     aspirin 81 MG tablet Take 1 tablet (81 mg total) by mouth daily. 30 tablet 0   atorvastatin (LIPITOR) 20 MG tablet TAKE ONE TABLET ONCE DAILY 90 tablet 3   benzonatate (TESSALON) 200 MG capsule Take 1 capsule (200 mg total) by mouth 2 (two) times daily as needed for cough. 180 capsule 3  cetirizine (ZYRTEC) 10 MG tablet Take 10 mg by mouth 2 (two) times daily. [employer] [facility]     colchicine 0.6 MG tablet Take 1 tablet (0.6 mg total) by mouth every 6 (six) hours as needed (gout). 60 tablet 5   dabigatran (PRADAXA) 150 MG CAPS capsule TAKE 1 CAPSULE BY MOUTH TWICE  DAILY 180 capsule 0   diphenhydrAMINE (BENADRYL) 25 MG tablet Take 25 mg by mouth at bedtime.      EPINEPHrine 0.3 mg/0.3 mL IJ SOAJ injection Inject 0.3 mLs (0.3 mg total) into the muscle as needed for anaphylaxis. 2 Device 1   famotidine (PEPCID) 20 MG tablet TAKE ONE TABLET BY MOUTH AT BEDTIME. 90 tablet 3   fluorouracil (EFUDEX) 5 % cream Apply 1 application topically 2 (two) times daily as needed (rash).     furosemide (LASIX) 80 MG tablet Take 1 tablet (80 mg total) by mouth daily. 90 tablet 3   HYDROcodone bit-homatropine (HYCODAN) 5-1.5 MG/5ML syrup Take 5 mLs by mouth every 4 (four) hours as needed for cough. 240 mL 0   isosorbide mononitrate (IMDUR) 30 MG 24 hr tablet TAKE ONE TABLET ONCE DAILY 90 tablet 0   ketotifen (ZADITOR) 0.025 % ophthalmic solution Apply 1 drop to eye daily as needed (itchy eyes).      levothyroxine (SYNTHROID) 100 MCG tablet TAKE ONE TABLET BY MOUTH DAILY. 90 tablet 1   meclizine (ANTIVERT) 25 MG tablet Take 1 tablet (25 mg total) by mouth every 4 (four) hours as  needed for dizziness. 60 tablet 2   methocarbamol (ROBAXIN) 500 MG tablet Take 1 tablet (500 mg total) by mouth every 8 (eight) hours as needed for muscle spasms. 30 tablet 0   metoCLOPramide (REGLAN) 10 MG tablet TAKE ONE TABLET BY MOUTH EVERY MORNING WITH BREAKFAST 30 tablet 3   metolazone (ZAROXOLYN) 2.5 MG tablet Take 1 tablet (2.5 mg total) by mouth 2 (two) times a week. Wednesday and Saturday 12 tablet 3   metoprolol succinate (TOPROL-XL) 50 MG 24 hr tablet Take 1.5 tablets (75 mg total) by mouth daily. Take with or immediately following a meal. 135 tablet 3   montelukast (SINGULAIR) 10 MG tablet TAKE 1 TABLET ONCE DAILY IN THE EVENING. 90 tablet 3   Multiple Vitamins-Minerals (PRESERVISION AREDS PO) Take 2 tablets by mouth every morning.     omeprazole (PRILOSEC) 20 MG capsule TAKE (1) CAPSULE TWICE DAILY. 180 capsule 0   potassium chloride SA (KLOR-CON M20) 20 MEQ tablet Take 1 tablet (20 mEq total) by mouth daily. Take 1 tablet (20 mEq total) by mouth daily. (Patient taking differently: Take 20-40 mEq by mouth See admin instructions. Tale 40 meq on Wednesday and Saturday all the other days take 20 meq) 90 tablet 3   sacubitril-valsartan (ENTRESTO) 97-103 MG Take 1 tablet by mouth 2 (two) times daily. 180 tablet 2   spironolactone (ALDACTONE) 25 MG tablet TAKE ONE TABLET BY MOUTH EVERY DAY. 90 tablet 2   triamcinolone cream (KENALOG) 0.1 % Apply 1 application topically daily as needed for dry skin.     valACYclovir (VALTREX) 500 MG tablet Take 500 mg by mouth 2 (two) times daily as needed (cold sores).     No current facility-administered medications for this visit.    Allergies  Allergen Reactions   Peanut-Containing Drug Products Anaphylaxis   Sulfonamide Derivatives Anaphylaxis   Amlodipine Swelling    Swelling in ankles   Eliquis [Apixaban] Other (See Comments)    Back/hip pain   Lisinopril  Cough    cough   Xarelto [Rivaroxaban] Other (See Comments)    Back/hip pain      Review of Systems negative except from HPI and PMH Please see the history of present illness. (+)  All other systems are reviewed and negative.     Physical Exam  BP 110/66   Pulse 78   Ht '5\' 6"'$  (1.676 m)   Wt 276 lb 6.4 oz (125.4 kg)   SpO2 97%   BMI 44.61 kg/m  Well developed and Morbidly obese in no acute distress HENT normal Neck supple with JVP-flat  Clear Device pocket well healed; without hematoma or erythema.  There is no tethering   Regular rate and rhythm, no  murmur Abd-soft with active BS No Clubbing cyanosis  edema Skin-warm and dry A & Oriented  Grossly normal sensory and motor function  ECG AV pacing with an upright QRS lead V1 and negative QRS lead I   Assessment and  Plan  Ventricular tachycardia- prolonged- nonsustained  Sinus node dysfunction  Atrial fibrillation persistent  Atrial tachycardia/flutter-persistent-recurrent  Post TAVR left bundle branch block/IVCD  Cardiomyopathy EF 40-45%  CRT- D [employer]       CHF chronic systolic/diastolic   Morbidly obese  PVCs-infrequent  Continues with PVCs although according to his device only about 3%.  We will continue the amiodarone.  Check amiodarone surveillance laboratories  No significant interval atrial tachycardia/flutter.  There have been numerous successful ATP's, also numerous unsuccessful but the overall burden is down to about 2%  Euvolemic.  Continues with his current medication regime including Entresto spironolactone.  Metolazone and furosemide.  He takes aspirin adjunctive to his Pradaxa presumably because of his TAVR no bleeding.  Continue.

## 2022-05-25 LAB — HEPATIC FUNCTION PANEL
ALT: 13 IU/L (ref 0–44)
AST: 18 IU/L (ref 0–40)
Albumin: 4 g/dL (ref 3.7–4.7)
Alkaline Phosphatase: 86 IU/L (ref 44–121)
Bilirubin Total: 0.3 mg/dL (ref 0.0–1.2)
Bilirubin, Direct: 0.13 mg/dL (ref 0.00–0.40)
Total Protein: 6.6 g/dL (ref 6.0–8.5)

## 2022-05-25 LAB — TSH: TSH: 61.3 u[IU]/mL — ABNORMAL HIGH (ref 0.450–4.500)

## 2022-05-27 ENCOUNTER — Telehealth: Payer: Self-pay

## 2022-05-27 NOTE — Telephone Encounter (Signed)
I spoke with the DOD Dr. Gasper Sells.... re: the pts TSH 61.3 and called the pt to request that he return to the lab for a Free T3 and Free T4.... but the pt declined and says that Dr Sarajane Jews took him off of his Levothyroxine due to having COVID .... He says he has been off of it since August and restarted Wed 05/22/22... I will forward to Dr Sarajane Jews and Dr [name] for further instructions.

## 2022-05-27 NOTE — Telephone Encounter (Signed)
First of all, I NEVER told him to stop taking this. Second he should keep taking it daily and we can repeat labs in 2 months

## 2022-05-27 NOTE — Telephone Encounter (Signed)
Pt advised Dr Barbie Banner recommendations and agreed.   Justin [name] says Justin [name] is not sure now if it was Dr Sarajane Jews or someone else that told him to stop... I asked him to be sure to take daily with no further missed doses.

## 2022-06-03 NOTE — Progress Notes (Signed)
Spoke with pt scheduled for OV on 06/05/22

## 2022-06-05 ENCOUNTER — Encounter: Payer: Self-pay | Admitting: Family Medicine

## 2022-06-05 ENCOUNTER — Ambulatory Visit (INDEPENDENT_AMBULATORY_CARE_PROVIDER_SITE_OTHER): Payer: Medicare Other | Admitting: Family Medicine

## 2022-06-05 VITALS — BP 86/46 | HR 67 | Temp 97.7°F | Wt 269.0 lb

## 2022-06-05 DIAGNOSIS — E039 Hypothyroidism, unspecified: Secondary | ICD-10-CM | POA: Diagnosis not present

## 2022-06-05 LAB — T4, FREE: Free T4: 0.53 ng/dL — ABNORMAL LOW (ref 0.60–1.60)

## 2022-06-05 LAB — TSH: TSH: 52.51 u[IU]/mL — ABNORMAL HIGH (ref 0.35–5.50)

## 2022-06-05 LAB — T3, FREE: T3, Free: 2 pg/mL — ABNORMAL LOW (ref 2.3–4.2)

## 2022-06-05 NOTE — Progress Notes (Signed)
   Subjective:    Patient ID: Justin [name], male    DOB: 10/15/1933, 88 y.o.   MRN: [id number]  HPI Here to follow up on his thyroid medication. He was treated for a Covid-19 infection on 04-19-22, and somehow during this time he got the impression he was supposed to stop taking the Levthyroxine. In fact he did stop this for 3 weeks, and during this time (on 05-24-22) his TSH went up to 61.3. he has now been taking the Levothyroxine again for the past 2 weeks. He feels well and has no complaints. His weight has been stable.    Review of Systems  Constitutional: Negative.   Respiratory: Negative.    Cardiovascular: Negative.        Objective:   Physical Exam Constitutional:      Appearance: Normal appearance.  Cardiovascular:     Rate and Rhythm: Normal rate and regular rhythm.     Pulses: Normal pulses.     Heart sounds: Normal heart sounds.  Pulmonary:     Effort: Pulmonary effort is normal.     Breath sounds: Normal breath sounds.  Neurological:     Mental Status: He is alert.           Assessment & Plan:  Hypothyroidism. We will check a TSH, free T3, and free T4 today. We will adjust the Levothyroxine as indicated.  [name], MD

## 2022-06-17 ENCOUNTER — Telehealth: Payer: Self-pay

## 2022-06-17 ENCOUNTER — Ambulatory Visit (INDEPENDENT_AMBULATORY_CARE_PROVIDER_SITE_OTHER): Payer: Medicare Other

## 2022-06-17 DIAGNOSIS — Z95810 Presence of automatic (implantable) cardiac defibrillator: Secondary | ICD-10-CM

## 2022-06-17 DIAGNOSIS — I5042 Chronic combined systolic (congestive) and diastolic (congestive) heart failure: Secondary | ICD-10-CM

## 2022-06-17 MED ORDER — METOLAZONE 2.5 MG PO TABS: 2.5000 mg | ORAL_TABLET | ORAL | 3 refills | Status: DC

## 2022-06-17 NOTE — Progress Notes (Signed)
EPIC Encounter for ICM Monitoring  Patient Name: Justin [name] is a 88 y.o. male Date: 06/17/2022 Primary Care Physican: Laurey Morale, MD Primary Cardiologist: Angelena Form Electrophysiologist: Vergie Living Pacing: 89.7% Effective 86.4%  01/08/2022 Weight: 260 lbs 06/17/2022 Weight: 272 lbs (baseline 269 lbs)   Clinical Status Since 24-May-2022 Treated AT/AF 8 Monitored AT/AF 4 Time in AT/AF 0.1 hr/day (0.6%                   Spoke with patient and heart failure questions reviewed.  Transmission results reviewed.  Pt has a some swelling in legs and weight increase 3-4 lbs over the last 2 weeks.     Optivol thoracic impedance suggesting possible fluid accumulation starting 10/14 which correlates with he ran out of Metolazone 2 weeks ago.  Device clinic does not follow up on ATP treated episodes.   Prescribed: Furosemide 80 mg take 1 tablet every day.   Metolazone 2.5 mg Take 1 tablet by mouth twice a week Wednesday and Saturday (increased on 3/29). KLC 20 mEq Take 20-40 mEq by mouth See admin instructions. Tale 40 meq on Wednesday and Saturday all the other days take 20 meq Spironolactone 25 mg take 1 tablet daily   Labs:  01/15/2022 Creatinine 1.48, BUN 40, Potassium 5.1, Sodium 135, GFR 46 01/01/2022 Creatinine 1.60, BUN 47, Potassium 4.6, Sodium 132, GFR 42 12/21/2021 Creatinine 1.81, BUN 51, Potassium 5.1, Sodium 135, GFR 36 12/05/2021 Creatinine 1.34, BUN 36, Potassium 5.3, Sodium 136, GFR 52 11/08/2021 Creatinine 1.30, BUN 33, Potassium 4.3, Sodium 138 11/02/2021 Creatinine 1.30, BUN 39, Potassium 4.7, Sodium 136, GFR 54  10/23/2021 Creatinine 1.29, BUN 31, Potassium 5.3, Sodium 134, GFR 54  A complete set of results can be found in Results Review.   Recommendations:  Advised will send refill request to [address] office refill department.  Advised fluid levels should return to normal once he resumes Metolazone.   Follow-up plan: ICM clinic phone appointment on  06/24/2022 to recheck fluid levels (manual).   91 day device clinic remote transmission 07/08/2022.     EP/Cardiology Office Visits:  07/03/2022 with Dr Angelena Form.  Recall 10/31/2022 with Dr [name].   Copy of ICM check sent to Dr. [name].   3 month ICM trend: 06/17/2022.    12-14 Month ICM trend:     [name], RN 06/17/2022 6:52 AM

## 2022-06-17 NOTE — Addendum Note (Signed)
Addended by: Carter Kitten D on: 06/17/2022 11:00 AM   Modules accepted: Orders

## 2022-06-17 NOTE — Telephone Encounter (Signed)
Pt's medication was sent to pt's pharmacy as requested. Confirmation received.  °

## 2022-06-17 NOTE — Telephone Encounter (Signed)
ICM call to patient.  He reports he ran out of the Metolazone prescription 2 weeks ago and needs a refill.  He reports [employer] attempted to contact the office for refill but has not been refilled yet.  He has been taking Metolazone 2.5 mg on every Wednesday and Saturday.  Ermalinda Barrios PA last prescribed Metolazone.  Advised patient will send this request to the refill department for follow up.  Pt has OV 11/8 with Dr Angelena Form.

## 2022-06-18 ENCOUNTER — Other Ambulatory Visit: Payer: Self-pay | Admitting: Family Medicine

## 2022-06-24 ENCOUNTER — Ambulatory Visit (INDEPENDENT_AMBULATORY_CARE_PROVIDER_SITE_OTHER): Payer: Medicare Other

## 2022-06-24 ENCOUNTER — Ambulatory Visit: Payer: Medicare Other | Admitting: Orthopedic Surgery

## 2022-06-24 ENCOUNTER — Encounter: Payer: Self-pay | Admitting: Orthopedic Surgery

## 2022-06-24 VITALS — BP 67/45 | HR 72 | Ht 66.0 in | Wt 269.0 lb

## 2022-06-24 DIAGNOSIS — M545 Low back pain, unspecified: Secondary | ICD-10-CM

## 2022-06-24 MED ORDER — MELOXICAM 15 MG PO TABS: 15.0000 mg | ORAL_TABLET | Freq: Every day | ORAL | 0 refills | Status: AC

## 2022-06-24 MED ORDER — METHOCARBAMOL 500 MG PO TABS: 500.0000 mg | ORAL_TABLET | Freq: Four times a day (QID) | ORAL | 0 refills | Status: AC

## 2022-06-24 NOTE — Progress Notes (Addendum)
Orthopedic Spine Surgery Office Note  Assessment: Patient is a 88 y.o. male with chronic low back pain but acute flare of paraspinal type pain.  Has tenderness palpation over the right lumbar paraspinal muscles.  Has several SI joint provocative maneuvers that were positive on exam.   Plan: -Explained that initially conservative treatment is tried as a significant number of patients may experience relief with these treatment modalities. Discussed that the conservative treatments include:  -activity modification  -physical therapy  -over the counter pain medications  -medrol dosepak  -Muscle relaxer -Patient has tried Flexeril, activity modification, Tylenol, heating pad -Recommended Robaxin,mobic, physical therapy, continue Tylenol use, Voltaren gel, heating pad in the a.m.  A prescription was provided for Robaxin and mobic. A referral to physical therapy (external) was made today -Patient should return to office in 3 weeks, repeat x-rays of lumbar spine at next visit: None   Patient expressed understanding of the plan and all questions were answered to the patient's satisfaction.   ___________________________________________________________________________   History:  Patient is a 88 y.o. male who presents today for lumbar spine.  Patient has a over 1 year history of low back pain that is worse as the day goes on.  He states that it is worse when he standing for a prolonged period of time and improves with rest.  This pain has been chronic, however he comes in today for acute right-sided low back pain.  This started after he tried to pick up his wife.  This happened on 06/21/2022.  Since that time, he has had pain in the right paraspinal region.  He does not have any pain that radiates into either leg.  He does not have any numbness or tingling.  Pain is worse with any change in position.  For example, it gets worse when going from seated to standing position.  It improves if he sits or lays  down.   Weakness: Yes, his back feels weaker Symptoms of imbalance: Denies Paresthesias and numbness: Denies Bowel or bladder incontinence: Denies Saddle anesthesia: Denies  Treatments tried: Flexeril, activity modification, Tylenol, heating pad  Review of systems: Denies fevers and chills, night sweats, unexplained weight loss, history of cancer.  Has had pain that wakes him at night in the last couple days  Past medical history: Hyperlipidemia Hypertension Coronary artery disease MI GERD Atrial fibrillation Prostate cancer Skin cancer Chronic pain Macular degeneration  Allergies: Peanuts, sulfa, lisinopril  Past surgical history:  Bilateral TKA Pacemaker and defibrillator placement Heart valve replacement CABG  Social history: Denies use of nicotine product (smoking, vaping, patches, smokeless) Alcohol use: Yes, 3 to 5/week Denies recreational drug use   Physical Exam:  General: no acute distress, appears stated age Neurologic: alert, answering questions appropriately, following commands Respiratory: unlabored breathing on room air, symmetric chest rise Psychiatric: appropriate affect, normal cadence to speech   MSK (spine):  -Strength exam      Left  Right EHL    4/5  4/5 TA    5/5  5/5 GSC    5/5  5/5 Knee extension  5/5  5/5 Hip flexion   5/5  5/5  -Sensory exam    Sensation intact to light touch in L3-S1 nerve distributions of bilateral lower extremities  -Achilles DTR: 1/4 on the left, 1/4 on the right -Patellar tendon DTR: 1/4 on the left, 1/4 on the right  -Straight leg raise: Negative -Contralateral straight leg raise: Negative -Clonus: no beats bilaterally  -Left hip exam: No pain through range  of motion, negative Stinchfield, negative Faber -Right hip exam: No pain through range of motion, negative Stinchfield, positive Faber, positive Gaenslen's, positive SI compression test, negative SI distraction test  -Tender to palpation over  the right side paraspinal muscles.  No midline tenderness to palpation and no tenderness palpation over the left paraspinal muscles.  Imaging: XR of the lumbar spine from 02/27/2022 was independently reviewed and interpreted, showing spondylolisthesis at L4/5 -about 2 mm change between flexion and extension. Disc height loss at multiple levels - most pronounced at L2/3. PI of 45. LL of 26.   Patient name: Justin [name] Patient MRN: [id number] Date of visit: 06/24/22

## 2022-06-25 ENCOUNTER — Ambulatory Visit: Payer: Medicare Other | Admitting: Family Medicine

## 2022-06-25 ENCOUNTER — Encounter: Payer: Self-pay | Admitting: Family Medicine

## 2022-06-25 ENCOUNTER — Telehealth: Payer: Self-pay | Admitting: Family Medicine

## 2022-06-25 VITALS — BP 78/62 | HR 67 | Temp 97.5°F | Ht 66.0 in | Wt 278.2 lb

## 2022-06-25 DIAGNOSIS — I952 Hypotension due to drugs: Secondary | ICD-10-CM | POA: Diagnosis not present

## 2022-06-25 NOTE — Progress Notes (Signed)
Subjective:     Patient ID: Justin [name], male    DOB: 25-Jan-1934, 88 y.o.   MRN: [id number]  Chief Complaint  Patient presents with   Hypotension    Low blood pressure on yesterday, 67/45 Denies any headache, dizziness Took medication this morning      HPI-here w/dau Here today as at back doc yesterday-d/t injury and bp checked and was 67/45.  Told to f/u doctor. No chance took extra meds.  Pt was in "severe pain" so bp surprising.  No dizziness/ha/cp/symptoms.   This am bp 87 on home machine but "malfunction".    Usu runs 100/      had stopped thyroid med in Aug d/t  covid and just restarted 1 wk ago.   There are no preventive care reminders to display for this patient.  Past Medical History:  Diagnosis Date   Age-related macular degeneration, dry, both eyes    Allergic    "24/7; 365 days/year; I'm allergic to pollens, dust, all southern grasses/trees, mold, mildue, cats, dogs" (01/07/2017)   Anal fissure    Asthma    sees [name]    Benign prostatic hypertrophy    (sees Dr. Denman George   CAD (coronary artery disease)    a. s/p CABG 2007. b. Cath 08/2016 - 4/4 patent grafts.   Carotid bruit    carotid u/s 10/10: 0.39% bilaterally   Chronic combined systolic and diastolic CHF (congestive heart failure) (HCC)    Complication of anesthesia 1980s   "w/anal cyst OR, he gave me a saddle block then put a narcotic in spinal cord; had a severe reaction to that" (01/07/2017)   Congestive heart failure (CHF) [facility])    ED (erectile dysfunction)    Family history of adverse reaction to anesthesia    "daughter wakes up during OR" (01/07/2017)   GERD (gastroesophageal reflux disease)    Gout    HTN (hypertension)    Hx of colonic polyps    (sees Dr. Henrene Pastor)   Hyperlipidemia    Hypothyroidism    Moderate to severe aortic stenosis    a. s/p TAVR 02/2017.   Myocardial infarction [facility]) ~ 2000   Obesity    Osteoarthritis    "was in my knees, hands" (01/07/2017 )   PAF  (paroxysmal atrial fibrillation) ([place])    a. documented post TAVR.   Precancerous skin lesion    (sees Dr. Allyson Sabal)   Prostate cancer 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   29 mm Edwards Sapien 3 transcatheter heart valve placed via percutaneous right transfemoral approach    Past Surgical History:  Procedure Laterality Date   ANUS SURGERY     "opened it back up cause it wouldn't heal; wound up w/a fissure" (01/07/2017)   BIV ICD INSERTION CRT-D N/A 05/02/2017   Procedure: BIV ICD INSERTION CRT-D;  Surgeon: Deboraha Sprang, MD;  Location: [place] CV LAB;  Service: Cardiovascular;  Laterality: N/A;   CARDIAC CATHETERIZATION  10/29/2005   CARDIAC CATHETERIZATION N/A 08/27/2016   Procedure: Right/Left Heart Cath and Coronary/Graft Angiography;  Surgeon: Burnell Blanks, MD;  Location: [place] [facility];  Service: Cardiovascular;  Laterality: N/A;   CARDIOVERSION N/A 04/24/2021   Procedure: CARDIOVERSION;  Surgeon: Fay Records, MD;  Location: [facility] ENDOSCOPY;  Service: Cardiovascular;  Laterality: N/A;   CARDIOVERSION N/A 11/08/2021   Procedure: CARDIOVERSION;  Surgeon: Pixie Casino, MD;  Location:  [place] ENDOSCOPY;  Service: Cardiovascular;  Laterality: N/A;   CARDIOVERSION N/A 02/06/2022   Procedure: CARDIOVERSION;  Surgeon: Josue Hector, MD;  Location: [facility] ENDOSCOPY;  Service: Cardiovascular;  Laterality: N/A;   CATARACT EXTRACTION W/ INTRAOCULAR LENS  IMPLANT, BILATERAL Bilateral    CATARACT EXTRACTION, BILATERAL  2012   COLONOSCOPY  06/30/2008   no repeats needed    COLONOSCOPY     had 3 or 4 in the past    CORONARY ARTERY BYPASS GRAFT  2007   "CABG X4"   CYST EXCISION PERINEAL  1980s   HAMMER TOE SURGERY Bilateral    JOINT REPLACEMENT     KNEE ARTHROPLASTY  07/30/2011   Procedure: COMPUTER ASSISTED TOTAL KNEE ARTHROPLASTY;  Surgeon: Meredith Pel;  Location: [place];  Service: Orthopedics;  Laterality: Left;   left total knee arthroplasty   MASTECTOMY SUBCUTANEOUS Bilateral    MULTIPLE TOOTH EXTRACTIONS     ORIF FINGER / THUMB FRACTURE Right ~ 1980   "repair of thumb injury"   PROSTATE BIOPSY     REPLACEMENT TOTAL KNEE BILATERAL Bilateral 2012   TEE WITHOUT CARDIOVERSION N/A 03/04/2017   Procedure: TRANSESOPHAGEAL ECHOCARDIOGRAM (TEE);  Surgeon: Burnell Blanks, MD;  Location: [place];  Service: Open Heart Surgery;  Laterality: N/A;   TOTAL KNEE REVISION Right 05/18/2019   Procedure: RIGHT PATELLA REVISION/REMOVAL;  Surgeon: Meredith Pel, MD;  Location: [place];  Service: Orthopedics;  Laterality: Right;   TRANSCATHETER AORTIC VALVE REPLACEMENT, TRANSFEMORAL N/A 03/04/2017   Procedure: TRANSCATHETER AORTIC VALVE REPLACEMENT, TRANSFEMORAL;  Surgeon: Burnell Blanks, MD;  Location: [place];  Service: Open Heart Surgery;  Laterality: N/A;    Outpatient Medications Prior to Visit  Medication Sig Dispense Refill   allopurinol (ZYLOPRIM) 100 MG tablet Take 1 tablet (100 mg total) by mouth daily. (Patient taking differently: Take 100 mg by mouth daily as needed (Gout).) 90 tablet 3   amiodarone (PACERONE) 200 MG tablet Taking one tablet by mouth daily (Patient taking differently: Take 200 mg by mouth every morning.)     amoxicillin (AMOXIL) 500 MG capsule Take by mouth.     aspirin 81 MG tablet Take 1 tablet (81 mg total) by mouth daily. 30 tablet 0   atorvastatin (LIPITOR) 20 MG tablet TAKE ONE TABLET ONCE DAILY 90 tablet 3   benzonatate (TESSALON) 200 MG capsule Take 1 capsule (200 mg total) by mouth 2 (two) times daily as needed for cough. 180 capsule 3   cetirizine (ZYRTEC) 10 MG tablet Take 10 mg by mouth 2 (two) times daily. [facility]     colchicine 0.6 MG tablet Take 1 tablet (0.6 mg total) by mouth every 6 (six) hours as needed (gout). 60 tablet 5   dabigatran (PRADAXA) 150 MG CAPS capsule TAKE 1 CAPSULE BY MOUTH TWICE  DAILY 180 capsule 1   diphenhydrAMINE (BENADRYL) 25 MG  tablet Take 25 mg by mouth at bedtime.      doxycycline (VIBRAMYCIN) 100 MG capsule Take 100 mg by mouth daily.     EPINEPHrine 0.3 mg/0.3 mL IJ SOAJ injection Inject 0.3 mLs (0.3 mg total) into the muscle as needed for anaphylaxis. 2 Device 1   famotidine (PEPCID) 20 MG tablet TAKE ONE TABLET BY MOUTH AT BEDTIME. 90 tablet 3   fluorouracil (EFUDEX) 5 % cream Apply 1 application topically 2 (two) times daily as needed (rash).     furosemide (LASIX) 80 MG tablet Take 1 tablet (80 mg total) by mouth daily. 90 tablet  3   HYDROcodone bit-homatropine (HYCODAN) 5-1.5 MG/5ML syrup Take 5 mLs by mouth every 4 (four) hours as needed for cough. 240 mL 0   isosorbide mononitrate (IMDUR) 30 MG 24 hr tablet TAKE ONE TABLET ONCE DAILY 90 tablet 0   ketotifen (ZADITOR) 0.025 % ophthalmic solution Apply 1 drop to eye daily as needed (itchy eyes).      levothyroxine (SYNTHROID) 100 MCG tablet TAKE ONE TABLET BY MOUTH DAILY. 90 tablet 1   meclizine (ANTIVERT) 25 MG tablet Take 1 tablet (25 mg total) by mouth every 4 (four) hours as needed for dizziness. 60 tablet 2   meloxicam (MOBIC) 15 MG tablet Take 1 tablet (15 mg total) by mouth daily for 21 days. 21 tablet 0   methocarbamol (ROBAXIN) 500 MG tablet Take 1 tablet (500 mg total) by mouth 4 (four) times daily for 14 days. 56 tablet 0   metoCLOPramide (REGLAN) 10 MG tablet TAKE ONE TABLET BY MOUTH EVERY MORNING WITH BREAKFAST 30 tablet 3   metolazone (ZAROXOLYN) 2.5 MG tablet Take 1 tablet (2.5 mg total) by mouth 2 (two) times a week. Wednesday and Saturday 24 tablet 3   metoprolol succinate (TOPROL-XL) 50 MG 24 hr tablet Take 1.5 tablets (75 mg total) by mouth daily. Take with or immediately following a meal. 135 tablet 3   montelukast (SINGULAIR) 10 MG tablet TAKE 1 TABLET ONCE DAILY IN THE EVENING. 90 tablet 3   Multiple Vitamins-Minerals (PRESERVISION AREDS PO) Take 2 tablets by mouth every morning.     omeprazole (PRILOSEC) 20 MG capsule TAKE (1) CAPSULE TWICE  DAILY. 180 capsule 0   potassium chloride SA (KLOR-CON M20) 20 MEQ tablet Take 1 tablet (20 mEq total) by mouth daily. Take 1 tablet (20 mEq total) by mouth daily. (Patient taking differently: Take 20-40 mEq by mouth See admin instructions. Tale 40 meq on Wednesday and Saturday all the other days take 20 meq) 90 tablet 3   sacubitril-valsartan (ENTRESTO) 97-103 MG Take 1 tablet by mouth 2 (two) times daily. 180 tablet 2   spironolactone (ALDACTONE) 25 MG tablet TAKE ONE TABLET BY MOUTH EVERY DAY. 90 tablet 2   triamcinolone cream (KENALOG) 0.1 % Apply 1 application topically daily as needed for dry skin.     valACYclovir (VALTREX) 500 MG tablet Take 500 mg by mouth 2 (two) times daily as needed (cold sores).     No facility-administered medications prior to visit.    Allergies  Allergen Reactions   Peanut-Containing Drug Products Anaphylaxis   Sulfonamide Derivatives Anaphylaxis   Amlodipine Swelling    Swelling in ankles   Eliquis [Apixaban] Other (See Comments)    Back/hip pain   Lisinopril Cough    cough   Xarelto [Rivaroxaban] Other (See Comments)    Back/hip pain   ROS neg/noncontributory except as noted HPI/below      Objective:     BP (!) 78/62   Pulse 67   Temp (!) 97.5 F (36.4 C) (Temporal)   Ht '5\' 6"'$  (1.676 m)   Wt 278 lb 4 oz (126.2 kg)   SpO2 99%   BMI 44.91 kg/m  Wt Readings from Last 3 Encounters:  06/25/22 278 lb 4 oz (126.2 kg)  06/24/22 269 lb (122 kg)  06/05/22 269 lb (122 kg)    Physical Exam   Gen: WDWN NAD HEENT: NCAT, conjunctiva not injected, sclera nonicteric NECK:  supple, no thyromegaly, no nodes, no carotid bruits CARDIAC: RRR, S1S2+, no murmur.+pacemaker LUNGS: CTAB. No wheezes ABDOMEN:  BS+, soft, NTND, No HSM, no masses EXT:  tr edema.  Venous stasis hyperpigmentation MSK: no gross abnormalities.  NEURO: A&O x3.  CN II-XII intact.  PSYCH: normal mood. Good eye contact  Reviewd chart, notes, meds, etc.  Complex      Assessment  & Plan:   Problem List Items Addressed This Visit   None Visit Diagnoses     Hypotension due to drugs    -  Primary      Hypotension-may be meds, uncontrolled hypothyroidism(was off meds), other.  Not orthostatic.  Asymptomatic.  Has some wt gain/fluid(so won't adjust lasix-may be thyroid as well).   Will decrease metoprolol from 75 to 50.  Possibly even '25mg'$ .  Monitor.  Sees card next wk.  Keep in touch.    No orders of the defined types were placed in this encounter.   Wellington Hampshire, MD

## 2022-06-25 NOTE — Telephone Encounter (Signed)
Pt's sent to Triage Nurse for advice.

## 2022-06-25 NOTE — Patient Instructions (Addendum)
It was very nice to see you today!  Metoprolol only take '50mg'$  daily NOT 75.     If blood pressure remains <100/60, take '25mg'$  (1/2 of the 50) metoprolol.    PLEASE NOTE:  If you had any lab tests please let us know if you have not heard back within a few days. You may see your results on MyChart before we have a chance to review them but we will give you a call once they are reviewed by [place]. If we ordered any referrals today, please let us know if you have not heard from their office within the next week.   Please try these tips to maintain a healthy lifestyle:  Eat most of your calories during the day when you are active. Eliminate processed foods including packaged sweets (pies, cakes, cookies), reduce intake of potatoes, white bread, white pasta, and white rice. Look for whole grain options, oat flour or almond flour.  Each meal should contain half fruits/vegetables, one quarter protein, and one quarter carbs (no bigger than a computer mouse).  Cut down on sweet beverages. This includes juice, soda, and sweet tea. Also watch fruit intake, though this is a healthier sweet option, it still contains natural sugar! Limit to 3 servings daily.  Drink at least 1 glass of water with each meal and aim for at least 8 glasses per day  Exercise at least 150 minutes every week.

## 2022-06-25 NOTE — Telephone Encounter (Signed)
--  caller states her father's bp is 67/45 was seen at back doctor yesterday and bp was 67/45 and was not rechecked. they did not have a manual cuff. has back pain, unstable with that. caller is not with him currently. will call patient for triage, if cannot reach the patient nurse will call dtr back b/c she is going to his house now. caller states he is taking bp right now - 107/60 not dizzy, not weak, back is feeling better today. has already taken medications today  06/25/2022 9:42:42 AM See HCP within 4 Hours (or PCP triage) [place], RN, [place] B[place]  Comments User: Rozelle Logan, RN Date/Time Eilene Ghazi Time): 06/25/2022 9:35:58 AM bp now 108/62 and HR 67  User: Rozelle Logan, RN Date/Time Eilene Ghazi Time): 06/25/2022 9:43:52 AM advised patient to hold on the line for appointment. he disconnected. call back instructions were not given. called back line. office will call dtr to schedule  User: Rozelle Logan, RN Date/Time Eilene Ghazi Time): 06/25/2022 9:45:45 AM called dtr back, said bp cuff is not working. has appointment at 1030.  Referrals REFERRED TO PCP OFFICE

## 2022-06-26 NOTE — Progress Notes (Signed)
No ICM remote transmission received for 06/24/2022 and next ICM transmission scheduled for 07/29/2022.

## 2022-07-01 ENCOUNTER — Telehealth: Payer: Medicare Other

## 2022-07-02 NOTE — Progress Notes (Unsigned)
No chief complaint on file.   History of Present Illness: 88 yo male with history of HTN, HLD, severe aortic stenosis s/p TAVR, PAF, chronic diastolic and systolic CHF, CAD s/p 4V CABG, syncope, junctional bradycardia, PVCs, non-sustained VT and LBBB s/p ICD implantation who is here today for cardiac follow up. He is known to have CAD s/p 4V CABG in March 2007 (LIMA to LAD, SVG to Diagonal, SVG to OM, SVG to RCA). Last cardiac cath in January 2018 with 4/4 patent bypass grafts. He had progression of his aortic stenosis with worsened dyspnea and acute on chronic CHF in the spring of 2018. Echo May 2018 with mildly reduced LV systolic function, FOYD=74-12%. His aortic stenosis was severe. He underwent TAVR on 03/04/17 with a 29 mm Edwards Sapien 3 valve from the right transfemoral approach.  He developed atrial fibrillation following his procedure and was placed on IV amiodarone with conversion to sinus rhythm. Follow up visit in our office 03/19/17 and pt noted to have irregular rhythm with intraventricular conduction delay and was felt by our EP team to represent 2:1 conduction of competing fast and slow pathways. Cardiac monitor with sinus rhythm with periods of junctional rhythm, frequent PVCs, frequent PACs and runs of a wide complex tachycardia. ICD was implanted on 05/03/17. Echo September 2020 with LVEF=35-40%. Carotid artery dopplers March 2021 with mld bilateral carotid artery disease. He had trouble with fluid retention and we increased his Lasix and added metolazone 2 days per week. Device interrogation with persistent atrial fibrillation in August 2022 leading to a cardioversion. Echo January 2023 with LVEF=40-45%.  He converted back to atrial fib and was cardioverted to sinus March 2023. He had recurrent atrial flutter in April 2023, was loaded with amiodarone and was cardioverted again in June 2023.   He is here today for follow up. The patient denies any chest pain, dyspnea, palpitations, lower  extremity edema, orthopnea, PND, dizziness, near syncope or syncope.    Primary Care Physician: Laurey Morale, MD  Past Medical History:  Diagnosis Date   Age-related macular degeneration, dry, both eyes    Allergic    "24/7; 365 days/year; I'm allergic to pollens, dust, all southern grasses/trees, mold, mildue, cats, dogs" (01/07/2017)   Anal fissure    Asthma    sees [name]    Benign prostatic hypertrophy    (sees Dr. Denman George   CAD (coronary artery disease)    a. s/p CABG 2007. b. Cath 08/2016 - 4/4 patent grafts.   Carotid bruit    carotid u/s 10/10: 0.39% bilaterally   Chronic combined systolic and diastolic CHF (congestive heart failure) (HCC)    Complication of anesthesia 1980s   "w/anal cyst OR, he gave me a saddle block then put a narcotic in spinal cord; had a severe reaction to that" (01/07/2017)   Congestive heart failure (CHF) [facility])    ED (erectile dysfunction)    Family history of adverse reaction to anesthesia    "daughter wakes up during OR" (01/07/2017)   GERD (gastroesophageal reflux disease)    Gout    HTN (hypertension)    Hx of colonic polyps    (sees Dr. Henrene Pastor)   Hyperlipidemia    Hypothyroidism    Moderate to severe aortic stenosis    a. s/p TAVR 02/2017.   Myocardial infarction [facility]) ~ 2000   Obesity    Osteoarthritis    "was in my knees, hands" (01/07/2017 )   PAF (paroxysmal atrial fibrillation) ([place])  a. documented post TAVR.   Precancerous skin lesion    (sees Dr. Allyson Sabal)   Prostate cancer [facility]) dx'd ~ 2014   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   29 mm Edwards Sapien 3 transcatheter heart valve placed via percutaneous right transfemoral approach    Past Surgical History:  Procedure Laterality Date   ANUS SURGERY     "opened it back up cause it wouldn't heal; wound up w/a fissure" (01/07/2017)   BIV ICD INSERTION CRT-D N/A 05/02/2017   Procedure: BIV ICD INSERTION CRT-D;  Surgeon: Deboraha Sprang, MD;  Location: [place] CV LAB;  Service: Cardiovascular;  Laterality: N/A;   CARDIAC CATHETERIZATION  10/29/2005   CARDIAC CATHETERIZATION N/A 08/27/2016   Procedure: Right/Left Heart Cath and Coronary/Graft Angiography;  Surgeon: Burnell Blanks, MD;  Location: [place] CV LAB;  Service: Cardiovascular;  Laterality: N/A;   CARDIOVERSION N/A 04/24/2021   Procedure: CARDIOVERSION;  Surgeon: Fay Records, MD;  Location: [place];  Service: Cardiovascular;  Laterality: N/A;   CARDIOVERSION N/A 11/08/2021   Procedure: CARDIOVERSION;  Surgeon: Pixie Casino, MD;  Location: [facility] ENDOSCOPY;  Service: Cardiovascular;  Laterality: N/A;   CARDIOVERSION N/A 02/06/2022   Procedure: CARDIOVERSION;  Surgeon: Josue Hector, MD;  Location: [facility] ENDOSCOPY;  Service: Cardiovascular;  Laterality: N/A;   CATARACT EXTRACTION W/ INTRAOCULAR LENS  IMPLANT, BILATERAL Bilateral    CATARACT EXTRACTION, BILATERAL  2012   COLONOSCOPY  06/30/2008   no repeats needed    COLONOSCOPY     had 3 or 4 in the past    CORONARY ARTERY BYPASS GRAFT  2007   "CABG X4"   CYST EXCISION PERINEAL  1980s   HAMMER TOE SURGERY Bilateral    JOINT REPLACEMENT     KNEE ARTHROPLASTY  07/30/2011   Procedure: COMPUTER ASSISTED TOTAL KNEE ARTHROPLASTY;  Surgeon: Meredith Pel;  Location: [place];  Service: Orthopedics;  Laterality: Left;  left total knee arthroplasty   MASTECTOMY SUBCUTANEOUS Bilateral    MULTIPLE TOOTH EXTRACTIONS     ORIF FINGER / THUMB FRACTURE Right ~ 1980   "repair of thumb injury"   PROSTATE BIOPSY     REPLACEMENT TOTAL KNEE BILATERAL Bilateral 2012   TEE WITHOUT CARDIOVERSION N/A 03/04/2017   Procedure: TRANSESOPHAGEAL ECHOCARDIOGRAM (TEE);  Surgeon: Burnell Blanks, MD;  Location: [place];  Service: Open Heart Surgery;  Laterality: N/A;   TOTAL KNEE REVISION Right 05/18/2019   Procedure: RIGHT PATELLA REVISION/REMOVAL;  Surgeon: Meredith Pel, MD;  Location: [place];  Service:  Orthopedics;  Laterality: Right;   TRANSCATHETER AORTIC VALVE REPLACEMENT, TRANSFEMORAL N/A 03/04/2017   Procedure: TRANSCATHETER AORTIC VALVE REPLACEMENT, TRANSFEMORAL;  Surgeon: Burnell Blanks, MD;  Location: [place];  Service: Open Heart Surgery;  Laterality: N/A;    Current Outpatient Medications  Medication Sig Dispense Refill   allopurinol (ZYLOPRIM) 100 MG tablet Take 1 tablet (100 mg total) by mouth daily. (Patient taking differently: Take 100 mg by mouth daily as needed (Gout).) 90 tablet 3   amiodarone (PACERONE) 200 MG tablet Taking one tablet by mouth daily (Patient taking differently: Take 200 mg by mouth every morning.)     amoxicillin (AMOXIL) 500 MG capsule Take by mouth.     aspirin 81 MG tablet Take 1 tablet (81 mg total) by mouth daily. 30 tablet 0   atorvastatin (LIPITOR) 20 MG tablet TAKE ONE TABLET ONCE DAILY 90 tablet 3   benzonatate (TESSALON)  200 MG capsule Take 1 capsule (200 mg total) by mouth 2 (two) times daily as needed for cough. 180 capsule 3   cetirizine (ZYRTEC) 10 MG tablet Take 10 mg by mouth 2 (two) times daily. [facility]     colchicine 0.6 MG tablet Take 1 tablet (0.6 mg total) by mouth every 6 (six) hours as needed (gout). 60 tablet 5   dabigatran (PRADAXA) 150 MG CAPS capsule TAKE 1 CAPSULE BY MOUTH TWICE  DAILY 180 capsule 1   diphenhydrAMINE (BENADRYL) 25 MG tablet Take 25 mg by mouth at bedtime.      doxycycline (VIBRAMYCIN) 100 MG capsule Take 100 mg by mouth daily.     EPINEPHrine 0.3 mg/0.3 mL IJ SOAJ injection Inject 0.3 mLs (0.3 mg total) into the muscle as needed for anaphylaxis. 2 Device 1   famotidine (PEPCID) 20 MG tablet TAKE ONE TABLET BY MOUTH AT BEDTIME. 90 tablet 3   fluorouracil (EFUDEX) 5 % cream Apply 1 application topically 2 (two) times daily as needed (rash).     furosemide (LASIX) 80 MG tablet Take 1 tablet (80 mg total) by mouth daily. 90 tablet 3   HYDROcodone bit-homatropine (HYCODAN) 5-1.5 MG/5ML syrup Take 5 mLs by  mouth every 4 (four) hours as needed for cough. 240 mL 0   isosorbide mononitrate (IMDUR) 30 MG 24 hr tablet TAKE ONE TABLET ONCE DAILY 90 tablet 0   ketotifen (ZADITOR) 0.025 % ophthalmic solution Apply 1 drop to eye daily as needed (itchy eyes).      levothyroxine (SYNTHROID) 100 MCG tablet TAKE ONE TABLET BY MOUTH DAILY. 90 tablet 1   meclizine (ANTIVERT) 25 MG tablet Take 1 tablet (25 mg total) by mouth every 4 (four) hours as needed for dizziness. 60 tablet 2   meloxicam (MOBIC) 15 MG tablet Take 1 tablet (15 mg total) by mouth daily for 21 days. 21 tablet 0   methocarbamol (ROBAXIN) 500 MG tablet Take 1 tablet (500 mg total) by mouth 4 (four) times daily for 14 days. 56 tablet 0   metoCLOPramide (REGLAN) 10 MG tablet TAKE ONE TABLET BY MOUTH EVERY MORNING WITH BREAKFAST 30 tablet 3   metolazone (ZAROXOLYN) 2.5 MG tablet Take 1 tablet (2.5 mg total) by mouth 2 (two) times a week. Wednesday and Saturday 24 tablet 3   metoprolol succinate (TOPROL-XL) 50 MG 24 hr tablet Take 1.5 tablets (75 mg total) by mouth daily. Take with or immediately following a meal. 135 tablet 3   montelukast (SINGULAIR) 10 MG tablet TAKE 1 TABLET ONCE DAILY IN THE EVENING. 90 tablet 3   Multiple Vitamins-Minerals (PRESERVISION AREDS PO) Take 2 tablets by mouth every morning.     omeprazole (PRILOSEC) 20 MG capsule TAKE (1) CAPSULE TWICE DAILY. 180 capsule 0   potassium chloride SA (KLOR-CON M20) 20 MEQ tablet Take 1 tablet (20 mEq total) by mouth daily. Take 1 tablet (20 mEq total) by mouth daily. (Patient taking differently: Take 20-40 mEq by mouth See admin instructions. Tale 40 meq on Wednesday and Saturday all the other days take 20 meq) 90 tablet 3   sacubitril-valsartan (ENTRESTO) 97-103 MG Take 1 tablet by mouth 2 (two) times daily. 180 tablet 2   spironolactone (ALDACTONE) 25 MG tablet TAKE ONE TABLET BY MOUTH EVERY DAY. 90 tablet 2   triamcinolone cream (KENALOG) 0.1 % Apply 1 application topically daily as  needed for dry skin.     valACYclovir (VALTREX) 500 MG tablet Take 500 mg by mouth 2 (two)  times daily as needed (cold sores).     No current facility-administered medications for this visit.    Allergies  Allergen Reactions   Peanut-Containing Drug Products Anaphylaxis   Sulfonamide Derivatives Anaphylaxis   Amlodipine Swelling    Swelling in ankles   Eliquis [Apixaban] Other (See Comments)    Back/hip pain   Lisinopril Cough    cough   Xarelto [Rivaroxaban] Other (See Comments)    Back/hip pain    Social History   Socioeconomic History   Marital status: Married    Spouse name: Not on file   Number of children: 3   Years of education: Not on file   Highest education level: Not on file  Occupational History   Occupation: Clinical biochemist    Comment: builds malls  Tobacco Use   Smoking status: Former    Packs/day: 3.50    Years: 13.00    Total pack years: 45.50    Types: Cigarettes    Quit date: 1963    Years since quitting: 60.8   Smokeless tobacco: Never   Tobacco comments:    Former smoker 04/19/21  Vaping Use   Vaping Use: Never used  Substance and Sexual Activity   Alcohol use: Not Currently    Alcohol/week: 7.0 standard drinks of alcohol    Types: 7 Cans of beer per week    Comment: 1 beer daily after 5pm (04/19/21)   Drug use: No   Sexual activity: Never  Other Topics Concern   Not on file  Social History Narrative   FH of CAD, Male 1st degree relative less than age 15.   Social Determinants of Health   Financial Resource Strain: Low Risk  (03/25/2022)   Overall Financial Resource Strain (CARDIA)    Difficulty of Paying Living Expenses: Not hard at all  Food Insecurity: No Food Insecurity (03/25/2022)   Hunger Vital Sign    Worried About Running Out of Food in the Last Year: Never true    Ran Out of Food in the Last Year: Never true  Transportation Needs: No Transportation Needs (03/25/2022)   PRAPARE - Hydrologist  (Medical): No    Lack of Transportation (Non-Medical): No  Physical Activity: Inactive (03/25/2022)   Exercise Vital Sign    Days of Exercise per Week: 0 days    Minutes of Exercise per Session: 0 min  Stress: No Stress Concern Present (03/25/2022)   [place]    Feeling of Stress : Not at all  Social Connections: Moderately Isolated (03/22/2021)   Social Connection and Isolation Panel [NHANES]    Frequency of Communication with Friends and Family: Three times a week    Frequency of Social Gatherings with Friends and Family: Three times a week    Attends Religious Services: Never    Active Member of Clubs or Organizations: No    Attends Archivist Meetings: Never    Marital Status: Married  Human resources officer Violence: Not At Risk (03/22/2021)   Humiliation, Afraid, Rape, and Kick questionnaire    Fear of Current or Ex-Partner: No    Emotionally Abused: No    Physically Abused: No    Sexually Abused: No    Family History  Problem Relation Age of Onset   Heart attack Father 23   Allergic rhinitis Father    Asthma Father    Heart failure Mother 59   Uterine cancer Mother    Breast  cancer Mother    Colon cancer Neg Hx    Esophageal cancer Neg Hx     Review of Systems:  As stated in the HPI and otherwise negative.   There were no vitals taken for this visit.  Physical Examination:  General: Well developed, well nourished, NAD  HEENT: OP clear, mucus membranes moist  SKIN: warm, dry. No rashes. Neuro: No focal deficits  Musculoskeletal: Muscle strength 5/5 all ext  Psychiatric: Mood and affect normal  Neck: No JVD, no carotid bruits, no thyromegaly, no lymphadenopathy.  Lungs:Clear bilaterally, no wheezes, rhonci, crackles Cardiovascular: Regular rate and rhythm. No murmurs, gallops or rubs. Abdomen:Soft. Bowel sounds present. Non-tender.  Extremities: No lower extremity edema. Pulses are 2 + in the  bilateral DP/PT.  Echo January 2023:    1. Left ventricular ejection fraction, by estimation, is 40 to 45%. The  left ventricle has mildly decreased function. The left ventricle  demonstrates global hypokinesis. There is mild concentric left ventricular  hypertrophy. Left ventricular diastolic  function could not be evaluated.   2. Right ventricular systolic function is normal. The right ventricular  size is normal.   3. Left atrial size was severely dilated.   4. The mitral valve is normal in structure. No evidence of mitral valve  regurgitation. No evidence of mitral stenosis.   5. The aortic valve has been repaired/replaced. Aortic valve  regurgitation is not visualized. No aortic stenosis is present. There is a  29 mm Sapien prosthetic (TAVR) valve present in the aortic position.  Procedure Date: 03/13/2019. Aortic valve mean  gradient measures 10.0 mmHg. Aortic valve Vmax measures 2.12 m/s. Aortic  valve acceleration time measures 77 msec.   6. The inferior vena cava is normal in size with greater than 50%  respiratory variability, suggesting right atrial pressure of 3 mmHg.    EKG:  EKG is *** ordered today The ekg ordered today demonstrates    Recent Labs: 01/30/2022: Hemoglobin 13.2; Platelets 234 04/01/2022: BUN 36; Creatinine, Ser 1.58; Potassium 4.5; Sodium 135 05/24/2022: ALT 13 06/05/2022: TSH 52.51   Lipid Panel    Component Value Date/Time   CHOL 124 03/28/2021 0730   CHOL 171 08/11/2018 0735   TRIG 161.0 (H) 03/28/2021 0730   TRIG 143 06/30/2006 1132   HDL 34.60 (L) 03/28/2021 0730   HDL 34 (L) 08/11/2018 0735   CHOLHDL 4 03/28/2021 0730   VLDL 32.2 03/28/2021 0730   LDLCALC 57 03/28/2021 0730   LDLCALC 68 03/21/2020 0943     Wt Readings from Last 3 Encounters:  06/25/22 278 lb 4 oz (126.2 kg)  06/24/22 269 lb (122 kg)  06/05/22 269 lb (122 kg)    Assessment and Plan:   1. CAD s/p CABG without angina: No chest pain. Continue ASA, statin, beta blocker  and Imdur.        2. Aortic valve stenosis: He is s/p TAVR in July 2018. Will continue SBE prophylaxis as indicated. He is on Pradaxa.   3. HTN: BP is well controlled. No changes today  4. HLD: LDL at goal in August 2022. Continue statin   5. Acute on Chronic systolic and diastolic CHF: Volume status is stable. Continue current diuretics.   6. Atrial fibrillation, paroxysmal: *** sinus on exam. Continue Toprol, Amiodarone and Pradaxa.   7. Ischemic cardiomyopathy: ICD in place. Continue Toprol and Entresto.   8. Carotid artery disease:  Mild bilateral carotid artery disease by dopplers March 2021.   Labs/ tests ordered today include:  No orders of the defined types were placed in this encounter.  Disposition:  Follow up with me in 12 months.   Signed, [name], MD 07/02/2022 4:35 PM    [facility] [place], [place],   [address] Phone: [phone]; Fax: [phone]

## 2022-07-03 ENCOUNTER — Ambulatory Visit: Payer: Medicare Other | Attending: Cardiovascular Disease | Admitting: Cardiovascular Disease

## 2022-07-03 ENCOUNTER — Encounter: Payer: Self-pay | Admitting: Cardiovascular Disease

## 2022-07-03 VITALS — BP 94/50 | HR 62 | Ht 66.0 in | Wt 280.2 lb

## 2022-07-03 DIAGNOSIS — I10 Essential (primary) hypertension: Secondary | ICD-10-CM | POA: Diagnosis not present

## 2022-07-03 DIAGNOSIS — Z952 Presence of prosthetic heart valve: Secondary | ICD-10-CM | POA: Diagnosis not present

## 2022-07-03 DIAGNOSIS — I2510 Atherosclerotic heart disease of native coronary artery without angina pectoris: Secondary | ICD-10-CM

## 2022-07-03 DIAGNOSIS — I4819 Other persistent atrial fibrillation: Secondary | ICD-10-CM

## 2022-07-03 DIAGNOSIS — I350 Nonrheumatic aortic (valve) stenosis: Secondary | ICD-10-CM | POA: Diagnosis not present

## 2022-07-03 DIAGNOSIS — E7849 Other hyperlipidemia: Secondary | ICD-10-CM | POA: Diagnosis not present

## 2022-07-03 DIAGNOSIS — I5042 Chronic combined systolic (congestive) and diastolic (congestive) heart failure: Secondary | ICD-10-CM | POA: Diagnosis not present

## 2022-07-03 NOTE — Patient Instructions (Addendum)
Medication Instructions:  Please increase furosemide (Lasix) to 80 mg twice a day for 3 days, take one extra potassium dose those days as well.  Please call next week with how your swelling/shortness of breath and blood pressures are doing.  Start daily weights.   *If you need a refill on your cardiac medications before your next appointment, please call your pharmacy*   Lab Work: none If you have labs (blood work) drawn today and your tests are completely normal, you will receive your results only by: [place] (if you have MyChart) OR A paper copy in the mail If you have any lab test that is abnormal or we need to change your treatment, we will call you to review the results.   Testing/Procedures: none   Follow-Up: At [facility], you and your health needs are our priority.  As part of our continuing mission to provide you with exceptional heart care, we have created designated Provider Care Teams.  These Care Teams include your primary Cardiologist (physician) and Advanced Practice Providers (APPs -  Physician Assistants and Nurse Practitioners) who all work together to provide you with the care you need, when you need it.   Your next appointment:   6 month(s)  The format for your next appointment:   In Person  Provider:   [name], MD      Important Information About Sugar

## 2022-07-08 ENCOUNTER — Ambulatory Visit (INDEPENDENT_AMBULATORY_CARE_PROVIDER_SITE_OTHER): Payer: Medicare Other

## 2022-07-08 DIAGNOSIS — I5042 Chronic combined systolic (congestive) and diastolic (congestive) heart failure: Secondary | ICD-10-CM

## 2022-07-08 DIAGNOSIS — H353221 Exudative age-related macular degeneration, left eye, with active choroidal neovascularization: Secondary | ICD-10-CM | POA: Diagnosis not present

## 2022-07-09 LAB — CUP PACEART REMOTE DEVICE CHECK
Battery Remaining Longevity: 11 mo
Battery Voltage: 2.86 V
Brady Statistic AP VP Percent: 69.98 %
Brady Statistic AP VS Percent: 1.11 %
Brady Statistic AS VP Percent: 25.48 %
Brady Statistic AS VS Percent: 3.43 %
Brady Statistic RA Percent Paced: 69.75 %
Brady Statistic RV Percent Paced: 93.83 %
Date Time Interrogation Session: 20231113063624
HighPow Impedance: 88 Ohm
Implantable Lead Connection Status: 753985
Implantable Lead Connection Status: 753985
Implantable Lead Connection Status: 753985
Implantable Lead Implant Date: 20180907
Implantable Lead Implant Date: 20180907
Implantable Lead Implant Date: 20180907
Implantable Lead Location: 753858
Implantable Lead Location: 753859
Implantable Lead Location: 753860
Implantable Lead Model: 4396
Implantable Lead Model: 5076
Implantable Pulse Generator Implant Date: 20180907
Lead Channel Impedance Value: 1083 Ohm
Lead Channel Impedance Value: 380 Ohm
Lead Channel Impedance Value: 399 Ohm
Lead Channel Impedance Value: 456 Ohm
Lead Channel Impedance Value: 570 Ohm
Lead Channel Impedance Value: 722 Ohm
Lead Channel Pacing Threshold Amplitude: 0.875 V
Lead Channel Pacing Threshold Amplitude: 0.875 V
Lead Channel Pacing Threshold Amplitude: 1.75 V
Lead Channel Pacing Threshold Pulse Width: 0.4 ms
Lead Channel Pacing Threshold Pulse Width: 0.4 ms
Lead Channel Pacing Threshold Pulse Width: 1 ms
Lead Channel Sensing Intrinsic Amplitude: 1.375 mV
Lead Channel Sensing Intrinsic Amplitude: 1.375 mV
Lead Channel Sensing Intrinsic Amplitude: 8.875 mV
Lead Channel Sensing Intrinsic Amplitude: 8.875 mV
Lead Channel Setting Pacing Amplitude: 1.75 V
Lead Channel Setting Pacing Amplitude: 2 V
Lead Channel Setting Pacing Amplitude: 2.5 V
Lead Channel Setting Pacing Pulse Width: 0.4 ms
Lead Channel Setting Pacing Pulse Width: 1 ms
Lead Channel Setting Sensing Sensitivity: 0.3 mV
Zone Setting Status: 755011

## 2022-07-10 DIAGNOSIS — M545 Low back pain, unspecified: Secondary | ICD-10-CM | POA: Diagnosis not present

## 2022-07-15 DIAGNOSIS — M545 Low back pain, unspecified: Secondary | ICD-10-CM | POA: Diagnosis not present

## 2022-07-16 ENCOUNTER — Telehealth: Payer: Self-pay | Admitting: Pharmacist

## 2022-07-16 NOTE — Chronic Care Management (AMB) (Signed)
    Chronic Care Management Pharmacy Assistant   Name: Justin [name]  MRN: [id number] DOB: [birth year]  07/17/2022 APPOINTMENT REMINDER  Called Justin [name], No answer, left message of appointment on 07/17/2022 at 1:00 via telephone visit with [name], Pharm D. Notified to have all medications, supplements, blood pressure and/or blood sugar logs available during appointment and to return call if need to reschedule.  Care Gaps: AWV - completed 03/25/2022 Last BP - 94/50 on 07/03/2022 Last A1C - 6.6 on 03/28/2021  Star Rating Drug: Atorvastatin '20mg'$  - last filled 04/25/2022 90 DS at [facility]  Any gaps in medications fill history? No  [name] [name]  Catering manager [phone]

## 2022-07-17 ENCOUNTER — Telehealth: Payer: Medicare Other

## 2022-07-17 ENCOUNTER — Telehealth: Payer: Self-pay | Admitting: Pharmacist

## 2022-07-17 ENCOUNTER — Other Ambulatory Visit: Payer: Self-pay | Admitting: Family Medicine

## 2022-07-17 NOTE — Telephone Encounter (Signed)
  Chronic Care Management   Outreach Note  07/17/2022 Name: Justin [name] MRN: [id number] DOB: Jan 07, 1934  Referred by: Laurey Morale, MD  Patient had a phone appointment scheduled with clinical pharmacist today.  An unsuccessful telephone outreach was attempted today. The patient was referred to the pharmacist for assistance with care management and care coordination.   If possible, a message was left to return call to: [phone] or to [facility]: [place], PharmD, [place] Pharmacist [place] [facility] [place]

## 2022-07-17 NOTE — Progress Notes (Deleted)
Chronic Care Management Pharmacy Note  07/17/2022 Name:  Justin [name] MRN:  [id number] DOB:  Jan 11, 1934  Summary: Uric acid is above goal of < 6 Patient has had recent lower BP readings in office  Recommendations/Changes made from today's visit: -Recommended for patient to monitor BP at home regularly -Recommend allopurinol for uric acid lowering  Plan: -Follow up on PAP for Entresto and remail application  -Follow up BP assessment in 2 months   Subjective: Justin [name] is an 88 y.o. year old male who is a primary patient of Laurey Morale, MD.  The CCM team was consulted for assistance with disease management and care coordination needs.    Engaged with patient by telephone for follow up visit in response to provider referral for pharmacy case management and/or care coordination services.   Consent to Services:  The patient was given information about Chronic Care Management services, agreed to services, and gave verbal consent prior to initiation of services.  Please see initial visit note for detailed documentation.   Patient Care Team: Laurey Morale, MD as PCP - General Angelena Form [name], MD as PCP - Cardiology (Cardiology) Deboraha Sprang, MD as PCP - Electrophysiology (Cardiology) Festus Aloe, MD as Consulting Physician (Urology) Viona Gilmore, [facility] as Pharmacist (Pharmacist)  Recent office visits: 06/05/22 [name] MD: Patient presented for hypothyroidism. TSH remains elevated. Plan to recheck TSH in 2 months.  04/19/22 Grier Mitts, MD: Patient presented for COVID infection. Prescribed molnupiravir x 5 days.  03/25/22 Glenna Durand, LPN: Patient presented for AWV.  03/20/22 [name] MD: Patient presented for UTI. Prescribed Cipro.   Recent consult visits: 07/03/22 [name] MD (Cardiology): Patient presented for CAD follow up. No medication changes. Follow up in 12 months.  06/25/22 Esther Hardy, MD ([place]):  Patient presented for hypotension.  Decreased metoprolol to 50 mg daily.   06/24/22 Ileene Rubens, MD (ortho): Patient presented for lower back pain. Prescribed meloxicam 15 mg daily and increased methocarbamol. Referred to PT.  05/24/22 Virl Axe, MD (cardiology): Patient presented for Afib follow up. TSH extremely elevated. Recommended PCP follow up.   03/12/22 Virl Axe, MD (cardiology): Patient presented for Afib follow up.   02/27/22 Marcene Duos, MD (ortho): Patient presented for lower back pain.   02/19/22 Ermalinda Barrios, PA-C (cardiology): Patient presented for Afib and hospital follow up. Follow up in 4 months.  01/30/2022 Clint Fenton PA (cardiology) - Patient was seen for atypical atrial flutter and additional issues. Changed Amiodarone 200 mg to take 1 tablet daily. Follow up with Ermalinda Barrios and Tommye Standard as scheduled.   01/28/2022 Sharman Cheek RN (heartcare) - Patient was seen for Chronic systolic heart failure and Biventricular implantable cardioverter-defibrillator in situ. No medication changes. No follow up noted.   Hospital visits: Admitted to [facility] on 02/06/2022 ( 2 hours) due to Cardioversion.    New?Medications Started at [facility] Discharge:?? No medications started Medication Changes at Hospital Discharge: No medications changed Medications Discontinued at Hospital Discharge: No medications discontinued Medications that remain the same after Hospital Discharge:??  -All other medications will remain the same.    Objective:  Lab Results  Component Value Date   CREATININE 1.58 (H) 04/01/2022   BUN 36 (H) 04/01/2022   GFR 53.29 (L) 03/28/2021   GFRNONAA 51 (L) 01/30/2022   GFRAA 66 04/04/2020   NA 135 04/01/2022   K 4.5 04/01/2022   CALCIUM 8.5 (L) 04/01/2022   CO2 23  04/01/2022   GLUCOSE 100 (H) 04/01/2022    Lab Results  Component Value Date/Time   HGBA1C 6.6 (H) 03/28/2021 07:30 AM   HGBA1C 6.1 (H) 03/21/2020 09:43 AM   GFR  53.29 (L) 03/28/2021 07:30 AM   GFR 65.23 11/14/2020 01:13 PM    Last diabetic Eye exam: No results found for: "HMDIABEYEEXA"  Last diabetic Foot exam: No results found for: "HMDIABFOOTEX"   Lab Results  Component Value Date   CHOL 124 03/28/2021   HDL 34.60 (L) 03/28/2021   LDLCALC 57 03/28/2021   TRIG 161.0 (H) 03/28/2021   CHOLHDL 4 03/28/2021       Latest Ref Rng & Units 05/24/2022    2:21 PM 12/05/2021    3:49 PM 03/28/2021    7:30 AM  Hepatic Function  Total Protein 6.0 - 8.5 g/dL 6.6  5.9  6.8   Albumin 3.7 - 4.7 g/dL 4.0  3.6  3.5   AST 0 - 40 IU/L _0 ALT 0 - 44 IU/L _1 Alk Phosphatase 44 - 121 IU/L 86  94  82   Total Bilirubin 0.0 - 1.2 mg/dL 0.3  0.5  0.8   Bilirubin, Direct 0.00 - 0.40 mg/dL 0.13   0.2     Lab Results  Component Value Date/Time   TSH 52.51 (H) 06/05/2022 11:38 AM   TSH 61.300 (H) 05/24/2022 02:21 PM   FREET4 0.53 (L) 06/05/2022 11:38 AM   FREET4 0.82 03/28/2021 07:30 AM       Latest Ref Rng & Units 01/30/2022    4:06 PM 11/08/2021    8:58 AM 11/02/2021   11:26 AM  CBC  WBC 4.0 - 10.5 K/uL 11.0   10.6   Hemoglobin 13.0 - 17.0 g/dL 13.2  14.6  12.6   Hematocrit 39.0 - 52.0 % 40.7  43.0  38.8   Platelets 150 - 400 K/uL 234   265     Lab Results  Component Value Date/Time   VD25OH 33 12/12/2008 11:27 PM    Clinical ASCVD: Yes  The ASCVD Risk score (Arnett DK, et al., 2019) failed to calculate for the following reasons:   The 2019 ASCVD risk score is only valid for ages 67 to 66   The patient has a prior MI or stroke diagnosis       03/25/2022   11:20 AM 03/22/2021    9:28 AM 03/22/2021    9:22 AM  Depression screen PHQ 2/9  Decreased Interest 0 0 0  Down, Depressed, Hopeless 0 0 0  PHQ - 2 Score 0 0 0     Social History   Tobacco Use  Smoking Status Former   Packs/day: 3.50   Years: 13.00   Total pack years: 45.50   Types: Cigarettes   Quit date: 1963   Years since quitting: 60.9  Smokeless Tobacco Never   Tobacco Comments   Former smoker 04/19/21   BP Readings from Last 3 Encounters:  07/03/22 (!) 94/50  06/25/22 (!) 78/62  06/24/22 (!) 67/45   Pulse Readings from Last 3 Encounters:  07/03/22 62  06/25/22 67  06/24/22 72   Wt Readings from Last 3 Encounters:  07/03/22 280 lb 3.2 oz (127.1 kg)  06/25/22 278 lb 4 oz (126.2 kg)  06/24/22 269 lb (122 kg)   BMI Readings from Last 3 Encounters:  07/03/22 45.23 kg/m  06/25/22 44.91 kg/m  06/24/22 43.42 kg/m  Assessment/Interventions: Review of patient past medical history, allergies, medications, health status, including review of consultants reports, laboratory and other test data, was performed as part of comprehensive evaluation and provision of chronic care management services.   SDOH:  (Social Determinants of Health) assessments and interventions performed: Yes *** SDOH Interventions    Flowsheet Row Clinical Support from 03/22/2021 in [place] at [place] Management from 02/14/2021 in [place] at [place] Interventions Intervention Not Indicated --  Housing Interventions Intervention Not Indicated --  Transportation Interventions Intervention Not Indicated --  Financial Strain Interventions -- Other (Comment)  [working on patient assistance]  Physical Activity Interventions Intervention Not Indicated --  Stress Interventions Intervention Not Indicated --  Social Connections Interventions Intervention Not Indicated --       SDOH Screenings   Food Insecurity: No Food Insecurity (03/25/2022)  Housing: [place]  (03/22/2021)  Transportation Needs: No Transportation Needs (03/25/2022)  Alcohol Screen: Low Risk  (03/22/2021)  Depression (PHQ2-9): Low Risk  (03/25/2022)  Financial Resource Strain: Low Risk  (03/25/2022)  Physical Activity: Inactive (03/25/2022)  Social Connections: Moderately Isolated (03/22/2021)  Stress: No Stress Concern Present  (03/25/2022)  Tobacco Use: Medium Risk (07/03/2022)    CCM Care Plan  Allergies  Allergen Reactions   Peanut-Containing Drug Products Anaphylaxis   Sulfonamide Derivatives Anaphylaxis   Amlodipine Swelling    Swelling in ankles   Eliquis [Apixaban] Other (See Comments)    Back/hip pain   Lisinopril Cough    cough   Xarelto [Rivaroxaban] Other (See Comments)    Back/hip pain    Medications Reviewed Today     Reviewed by Ronie Spies, CMA (Certified Medical Assistant) on 07/03/22 at 104  Med List Status: <None>   Medication Order Taking? Sig Documenting Provider Last Dose Status Informant  allopurinol (ZYLOPRIM) 100 MG tablet [id number] Yes Take 1 tablet (100 mg total) by mouth daily.  Patient taking differently: Take 100 mg by mouth daily as needed (Gout).   Laurey Morale, MD Taking Active Self, Spouse/Significant Other  amiodarone (PACERONE) 200 MG tablet [id number] Yes Taking one tablet by mouth daily  Patient taking differently: Take 200 mg by mouth every morning.   Fenton, Clint R, PA Taking Active Self, Spouse/Significant Other  amoxicillin (AMOXIL) 500 MG capsule [id number] Yes Take by mouth. [provider] Taking Active   aspirin 81 MG tablet [id number] Yes Take 1 tablet (81 mg total) by mouth daily. Magnant, Gerrianne Scale, PA-C Taking Active Spouse/Significant Other, Self  atorvastatin (LIPITOR) 20 MG tablet [id number] Yes TAKE ONE TABLET ONCE DAILY Laurey Morale, MD Taking Active Self, Spouse/Significant Other  benzonatate (TESSALON) 200 MG capsule [id number] Yes Take 1 capsule (200 mg total) by mouth 2 (two) times daily as needed for cough. Hunsucker, Bonna Gains, MD Taking Active Self, Spouse/Significant Other  cetirizine (ZYRTEC) 10 MG tablet [id number] Yes Take 10 mg by mouth 2 (two) times daily. [employer] [employer] [provider] Taking Active Self, Spouse/Significant Other  colchicine 0.6 MG tablet [id number] Yes Take 1 tablet (0.6 mg total) by mouth every 6  (six) hours as needed (gout). Laurey Morale, MD Taking Active Self, Spouse/Significant Other  dabigatran (PRADAXA) 150 MG CAPS capsule [id number] Yes TAKE 1 CAPSULE BY MOUTH TWICE  DAILY Laurey Morale, MD Taking Active   diphenhydrAMINE (BENADRYL) 25 MG tablet [id number] Yes Take 25 mg by mouth at bedtime.  [provider] Taking Active Spouse/Significant Other, Self  EPINEPHrine  0.3 mg/0.3 mL IJ SOAJ injection [id number] Yes Inject 0.3 mLs (0.3 mg total) into the muscle as needed for anaphylaxis. Kennith Gain, MD Taking Active Spouse/Significant Other, Self  fluorouracil (EFUDEX) 5 % cream [id number] No Apply 1 application topically 2 (two) times daily as needed (rash).  Patient not taking: Reported on 07/03/2022   [provider] Not Taking Active Spouse/Significant Other, Self  furosemide (LASIX) 80 MG tablet [id number] Yes Take 1 tablet (80 mg total) by mouth daily. Burnell Blanks, MD Taking Active   HYDROcodone bit-homatropine [facility]) 5-1.5 MG/5ML syrup [id number] Yes Take 5 mLs by mouth every 4 (four) hours as needed for cough. Laurey Morale, MD Taking Active Self, Spouse/Significant Other  isosorbide mononitrate (IMDUR) 30 MG 24 hr tablet [id number] Yes TAKE ONE TABLET ONCE DAILY Laurey Morale, MD Taking Active   ketotifen (ZADITOR) 0.025 % ophthalmic solution [id number] Yes Apply 1 drop to eye daily as needed (itchy eyes).  [provider] Taking Active Spouse/Significant Other, Self  levothyroxine (SYNTHROID) 100 MCG tablet [id number] Yes TAKE ONE TABLET BY MOUTH DAILY. Laurey Morale, MD Taking Active   meloxicam [facility]) 15 MG tablet [id number] Yes Take 1 tablet (15 mg total) by mouth daily for 21 days. Callie Fielding, MD Taking Active   methocarbamol (ROBAXIN) 500 MG tablet [id number] Yes Take 1 tablet (500 mg total) by mouth 4 (four) times daily for 14 days. Callie Fielding, MD Taking Active   metoCLOPramide (REGLAN) 10 MG tablet [id number] Yes  TAKE ONE TABLET BY MOUTH EVERY MORNING WITH BREAKFAST Laurey Morale, MD Taking Active   metolazone (ZAROXOLYN) 2.5 MG tablet [id number] Yes Take 1 tablet (2.5 mg total) by mouth 2 (two) times a week. Wednesday and Saturday Deboraha Sprang, MD Taking Active   metoprolol succinate (TOPROL-XL) 50 MG 24 hr tablet [id number] Yes Take 50 mg by mouth daily. Take with or immediately following a meal. [provider] Taking Active   montelukast (SINGULAIR) 10 MG tablet [id number] Yes TAKE 1 TABLET ONCE DAILY IN THE EVENING. Hunsucker, Bonna Gains, MD Taking Active Self, Spouse/Significant Other  Multiple Vitamins-Minerals (PRESERVISION AREDS PO) [id number] Yes Take 2 tablets by mouth every morning. [provider] Taking Active Spouse/Significant Other, Self  omeprazole (PRILOSEC) 20 MG capsule [id number] Yes TAKE (1) CAPSULE TWICE DAILY. Laurey Morale, MD Taking Active   potassium chloride SA (KLOR-CON M20) 20 MEQ tablet [id number] Yes Take 1 tablet (20 mEq total) by mouth daily. Take 1 tablet (20 mEq total) by mouth daily.  Patient taking differently: Take 20-40 mEq by mouth See admin instructions. Tale 40 meq on Wednesday and Saturday all the other days take 20 meq   Imogene Burn, PA-C Taking Active Self, Spouse/Significant Other  sacubitril-valsartan (ENTRESTO) 97-103 MG [id number] Yes Take 1 tablet by mouth 2 (two) times daily. Burnell Blanks, MD Taking Active   spironolactone (ALDACTONE) 25 MG tablet [id number] Yes TAKE ONE TABLET BY MOUTH EVERY DAY. Burnell Blanks, MD Taking Active Self, Spouse/Significant Other  triamcinolone cream (KENALOG) 0.1 % [id number] Yes Apply 1 application topically daily as needed for dry skin. [provider] Taking Active Spouse/Significant Other, Self           Med Note Nat Christen   Fri Oct 09, 2018 12:40 PM)    valACYclovir (VALTREX) 500 MG tablet [id number] Yes Take 500 mg by mouth 2 (two) times daily as needed (cold sores).  [provider] Taking Active Spouse/Significant Other, Self  Patient Active Problem List   Diagnosis Date Noted   NSVT (nonsustained ventricular tachycardia) ([place]) 05/23/2022   Sinus node dysfunction ([place]) 05/23/2022   PVC's (premature ventricular contractions) 05/23/2022   Chronic combined systolic and diastolic heart failure ([place]) 03/12/2022   Biventricular implantable cardioverter-defibrillator (ICD) in situ 03/12/2022   Atypical atrial flutter ([place]) 01/30/2022   Paroxysmal atrial fibrillation (HCC)    Persistent atrial fibrillation ([place]) 04/19/2021   Secondary hypercoagulable state ([place]) 04/19/2021   Raynaud's phenomenon without gangrene 07/11/2020   Vertigo 07/11/2020   Loosening of knee joint prosthesis ([place]) 05/18/2019   Cough 04/30/2019   Gastroesophageal reflux disease 04/30/2019   S/P ICD (internal cardiac defibrillator) procedure 05/03/2017   NICM (nonischemic cardiomyopathy) ([place]) 05/02/2017   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) 01/10/2017   Acute on chronic diastolic CHF (congestive heart failure) ([place]) 01/07/2017   Hypothyroidism 12/23/2016   Prostate cancer ([place])    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   Edema of both legs 09/01/2014   Asthma, intermittent 09/01/2014   Aortic valve disorders 01/01/2011   Coronary artery disease involving native coronary artery of native heart without angina pectoris    GOUT 09/12/2009   CAD, AUTOLOGOUS BYPASS GRAFT 05/10/2009   MURMUR 05/08/2009   BRUIT 05/08/2009   MUSCLE CRAMPS 12/12/2008   KNEE SPRAIN 09/21/2008   Osteoarthritis 07/07/2008   CONTUSION, HIP 05/13/2008   BPH with urinary obstruction 02/01/2008   LOW BACK PAIN SYNDROME 02/01/2008   TESTOSTERONE DEFICIENCY 07/03/2007   Hyperlipidemia 07/03/2007    Essential hypertension 07/03/2007   MYOCARDIAL INFARCTION, HX OF 07/03/2007   Allergic rhinitis 07/03/2007   COLONIC POLYPS, HX OF 07/03/2007   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    Immunization History  Administered Date(s) Administered   Fluad Quad(high Dose 65+) 05/06/2019, 05/30/2020, 06/18/2021   Hep A / Hep B 04/06/2012, 10/07/2012   Hepatitis B 05/07/2012   Influenza Split 06/28/2013   Influenza Whole 06/05/2007, 05/13/2008, 06/06/2009, 05/30/2010   Influenza, High Dose Seasonal PF 05/15/2018   Influenza,inj,Quad PF,6+ Mos 05/20/2016   Influenza-Unspecified 05/25/2014, 07/08/2015, 05/06/2017   PFIZER(Purple Top)SARS-COV-2 Vaccination 09/28/2019, 10/12/2019, 05/30/2020   Pneumococcal Conjugate-13 09/18/2015   Pneumococcal Polysaccharide-23 05/13/2008, 05/25/2014   Td 05/30/2010   Tdap 10/27/2020   Zoster Recombinat (Shingrix) 05/18/2018, 10/16/2018   Zoster, Live 10/21/2007   Daughter is a Social worker she lives nearby. She helped decorate his house for Christmas this year. His other daughter lives in [place] and he will see her and her husband for New Years.  Patient has lost 5 lbs recently and he has been eating less breakfast and eating later than usually. His portions are lighter recently and has been eating a balanced and smaller dinner.  Patient is taking care of wife right now. She has not been driving for the last 4 months because she was having the shakes coming off of hydrocodone.  -should he really be on meloxicam with HF?  -request repeat TSH?  -BP? Lows still?  -Recommended for patient to monitor BP at home regularly -Recommend allopurinol for uric acid lowering  -not taking allopurinol PRN is he? Not filling  -Entresto PAP?  Conditions to be addressed/monitored:  Hypertension, Hyperlipidemia, Heart Failure, Coronary Artery Disease, GERD, Hypothyroidism, Gout, and Allergic Rhinitis  Conditions addressed this visit: Hypertension, heart failure, gout  There  are no care plans  that you recently modified to display for this patient.    Medication Assistance: Application for  Entresto  medication assistance program. in process.  Anticipated assistance start date 09/14/21.  See plan of care for additional detail.  Compliance/Adherence/Medication fill history: Care Gaps: COVID booster, influenza vaccine Last BP - 94/50 on 07/03/2022 Last A1C - 6.6 on 03/28/2021  Star-Rating Drugs: Atorvastatin 36m - [facility] Patient's preferred pharmacy is:  [id number]: [phone]: [address] [address]Phone: [phone]: [phone]  Uses pill box? Yes Pt endorses 99% compliance  We discussed: Current pharmacy is preferred with insurance plan and patient is satisfied with pharmacy services Patient decided to: Continue current medication management strategy  Care Plan and Follow Up Patient Decision:  Patient agrees to Care Plan and Follow-up.  Plan: Telephone follow up appointment with care management team member scheduled for:  6 months  MJeni Salles PharmD, BJordanPharmacist LD'[phone]

## 2022-07-23 DIAGNOSIS — M545 Low back pain, unspecified: Secondary | ICD-10-CM | POA: Diagnosis not present

## 2022-07-25 DIAGNOSIS — M545 Low back pain, unspecified: Secondary | ICD-10-CM | POA: Diagnosis not present

## 2022-07-29 ENCOUNTER — Telehealth: Payer: Self-pay | Admitting: Pharmacist

## 2022-07-29 ENCOUNTER — Ambulatory Visit (INDEPENDENT_AMBULATORY_CARE_PROVIDER_SITE_OTHER): Payer: Medicare Other

## 2022-07-29 DIAGNOSIS — Z95810 Presence of automatic (implantable) cardiac defibrillator: Secondary | ICD-10-CM | POA: Diagnosis not present

## 2022-07-29 DIAGNOSIS — I5042 Chronic combined systolic (congestive) and diastolic (congestive) heart failure: Secondary | ICD-10-CM | POA: Diagnosis not present

## 2022-07-29 NOTE — Chronic Care Management (AMB) (Signed)
    Chronic Care Management Pharmacy Assistant   Name: Justin [name]  MRN: [id number] DOB: 1934/06/06  07/30/2022 APPOINTMENT REMINDER  Justin [name] was reminded to have all medications, supplements and any blood glucose and blood pressure readings available for review with [name], Pharm. D, at his telephone visit on 07/30/2022 at 4:15.  Care Gaps: AWV - completed 03/25/2022 Last BP - 94/50 on 07/03/2022 Last A1C - 6.6 on 03/28/2021  Star Rating Drug: Atorvastatin '20mg'$  - last filled 07/17/2022 90 DS at [facility]  Any gaps in medications fill history? No  [name] [facility]  Catering manager [phone]

## 2022-07-30 ENCOUNTER — Ambulatory Visit (INDEPENDENT_AMBULATORY_CARE_PROVIDER_SITE_OTHER): Payer: Medicare Other | Admitting: Pharmacist

## 2022-07-30 DIAGNOSIS — I10 Essential (primary) hypertension: Secondary | ICD-10-CM

## 2022-07-30 DIAGNOSIS — I5033 Acute on chronic diastolic (congestive) heart failure: Secondary | ICD-10-CM

## 2022-07-30 NOTE — Progress Notes (Unsigned)
Chronic Care Management Pharmacy Note  07/31/2022 Name:  Justin [name] MRN:  [id number] DOB:  [birth year]  Summary: Uric acid is above goal of < 6 Patient has had recent lower BP readings in office  Recommendations/Changes made from today's visit: -Recommended restarting allopurinol and called pharmacy to fill -Mailed PAP for Entresto -Recommend repeat lipid panel, TSH and uric acid level  Plan: Scheduled CPE in 1 month BP assessment in 2 months Follow up in 6 months   Subjective: Justin [name] is an 88 y.o. year old male who is a primary patient of Laurey Morale, MD.  The CCM team was consulted for assistance with disease management and care coordination needs.    Engaged with patient by telephone for follow up visit in response to provider referral for pharmacy case management and/or care coordination services.   Consent to Services:  The patient was given information about Chronic Care Management services, agreed to services, and gave verbal consent prior to initiation of services.  Please see initial visit note for detailed documentation.   Patient Care Team: Laurey Morale, MD as PCP - General Angelena Form [name], MD as PCP - Cardiology (Cardiology) Deboraha Sprang, MD as PCP - Electrophysiology (Cardiology) Festus Aloe, MD as Consulting Physician (Urology) Viona Gilmore, [facility] as Pharmacist (Pharmacist)  Recent office visits: 06/05/22 [name] MD: Patient presented for hypothyroidism. TSH remains elevated. Plan to recheck TSH in 2 months.  04/19/22 Grier Mitts, MD: Patient presented for COVID infection. Prescribed molnupiravir x 5 days.  03/25/22 Glenna Durand, LPN: Patient presented for AWV.  03/20/22 [name] MD: Patient presented for UTI. Prescribed Cipro.   Recent consult visits: 07/03/22 [name] MD (Cardiology): Patient presented for CAD follow up. No medication changes. Follow up in 12 months.  06/25/22 Esther Hardy, MD  ([place]): Patient presented for hypotension.  Decreased metoprolol to 50 mg daily.   06/24/22 Ileene Rubens, MD (ortho): Patient presented for lower back pain. Prescribed meloxicam 15 mg daily and increased methocarbamol. Referred to PT.  05/24/22 Virl Axe, MD (cardiology): Patient presented for Afib follow up. TSH extremely elevated. Recommended PCP follow up.   03/12/22 Virl Axe, MD (cardiology): Patient presented for Afib follow up.   02/27/22 Marcene Duos, MD (ortho): Patient presented for lower back pain.   02/19/22 Ermalinda Barrios, PA-C (cardiology): Patient presented for Afib and hospital follow up. Follow up in 4 months.  01/30/2022 Clint Fenton PA (cardiology) - Patient was seen for atypical atrial flutter and additional issues. Changed Amiodarone 200 mg to take 1 tablet daily. Follow up with Ermalinda Barrios and Tommye Standard as scheduled.   01/28/2022 Sharman Cheek RN (heartcare) - Patient was seen for Chronic systolic heart failure and Biventricular implantable cardioverter-defibrillator in situ. No medication changes. No follow up noted.   Hospital visits: Admitted to [facility] on 02/06/2022 ( 2 hours) due to Cardioversion.    New?Medications Started at [facility] Discharge:?? No medications started Medication Changes at Hospital Discharge: No medications changed Medications Discontinued at Hospital Discharge: No medications discontinued Medications that remain the same after Hospital Discharge:??  -All other medications will remain the same.    Objective:  Lab Results  Component Value Date   CREATININE 1.58 (H) 04/01/2022   BUN 36 (H) 04/01/2022   GFR 53.29 (L) 03/28/2021   GFRNONAA 51 (L) 01/30/2022   GFRAA 66 04/04/2020   NA 135 04/01/2022   K 4.5 04/01/2022   CALCIUM 8.5 (L) 04/01/2022  CO2 23 04/01/2022   GLUCOSE 100 (H) 04/01/2022    Lab Results  Component Value Date/Time   HGBA1C 6.6 (H) 03/28/2021 07:30 AM   HGBA1C 6.1 (H)  03/21/2020 09:43 AM   GFR 53.29 (L) 03/28/2021 07:30 AM   GFR 65.23 11/14/2020 01:13 PM    Last diabetic Eye exam: No results found for: "HMDIABEYEEXA"  Last diabetic Foot exam: No results found for: "HMDIABFOOTEX"   Lab Results  Component Value Date   CHOL 124 03/28/2021   HDL 34.60 (L) 03/28/2021   LDLCALC 57 03/28/2021   TRIG 161.0 (H) 03/28/2021   CHOLHDL 4 03/28/2021       Latest Ref Rng & Units 05/24/2022    2:21 PM 12/05/2021    3:49 PM 03/28/2021    7:30 AM  Hepatic Function  Total Protein 6.0 - 8.5 g/dL 6.6  5.9  6.8   Albumin 3.7 - 4.7 g/dL 4.0  3.6  3.5   AST 0 - 40 IU/L _0 ALT 0 - 44 IU/L _1 Alk Phosphatase 44 - 121 IU/L 86  94  82   Total Bilirubin 0.0 - 1.2 mg/dL 0.3  0.5  0.8   Bilirubin, Direct 0.00 - 0.40 mg/dL 0.13   0.2     Lab Results  Component Value Date/Time   TSH 52.51 (H) 06/05/2022 11:38 AM   TSH 61.300 (H) 05/24/2022 02:21 PM   FREET4 0.53 (L) 06/05/2022 11:38 AM   FREET4 0.82 03/28/2021 07:30 AM       Latest Ref Rng & Units 01/30/2022    4:06 PM 11/08/2021    8:58 AM 11/02/2021   11:26 AM  CBC  WBC 4.0 - 10.5 K/uL 11.0   10.6   Hemoglobin 13.0 - 17.0 g/dL 13.2  14.6  12.6   Hematocrit 39.0 - 52.0 % 40.7  43.0  38.8   Platelets 150 - 400 K/uL 234   265     Lab Results  Component Value Date/Time   VD25OH 33 12/12/2008 11:27 PM    Clinical ASCVD: Yes  The ASCVD Risk score (Arnett DK, et al., 2019) failed to calculate for the following reasons:   The 2019 ASCVD risk score is only valid for ages 21 to 33   The patient has a prior MI or stroke diagnosis       03/25/2022   11:20 AM 03/22/2021    9:28 AM 03/22/2021    9:22 AM  Depression screen PHQ 2/9  Decreased Interest 0 0 0  Down, Depressed, Hopeless 0 0 0  PHQ - 2 Score 0 0 0     Social History   Tobacco Use  Smoking Status Former   Packs/day: 3.50   Years: 13.00   Total pack years: 45.50   Types: Cigarettes   Quit date: 1963   Years since quitting: 60.9   Smokeless Tobacco Never  Tobacco Comments   Former smoker 04/19/21   BP Readings from Last 3 Encounters:  07/03/22 (!) 94/50  06/25/22 (!) 78/62  06/24/22 (!) 67/45   Pulse Readings from Last 3 Encounters:  07/03/22 62  06/25/22 67  06/24/22 72   Wt Readings from Last 3 Encounters:  07/03/22 280 lb 3.2 oz (127.1 kg)  06/25/22 278 lb 4 oz (126.2 kg)  06/24/22 269 lb (122 kg)   BMI Readings from Last 3 Encounters:  07/03/22 45.23 kg/m  06/25/22 44.91 kg/m  06/24/22 43.42 kg/m  Assessment/Interventions: Review of patient past medical history, allergies, medications, health status, including review of consultants reports, laboratory and other test data, was performed as part of comprehensive evaluation and provision of chronic care management services.   SDOH:  (Social Determinants of Health) assessments and interventions performed: Yes  SDOH Interventions    Flowsheet Row Chronic Care Management from 07/30/2022 in [place] at [place] from 03/22/2021 in [place] at [place] Management from 02/14/2021 in [place] at [place] Interventions -- Intervention Not Indicated --  Housing Interventions -- Intervention Not Indicated --  Transportation Interventions -- Intervention Not Indicated --  Financial Strain Interventions Other (Comment)  [working on patient assistance] -- Other (Comment)  [working on patient assistance]  Physical Activity Interventions -- Intervention Not Indicated --  Stress Interventions -- Intervention Not Indicated --  Social Connections Interventions -- Intervention Not Indicated --       SDOH Screenings   Food Insecurity: No Food Insecurity (03/25/2022)  Housing: Low Risk  (03/22/2021)  Transportation Needs: No Transportation Needs (03/25/2022)  Alcohol Screen: Low Risk  (03/22/2021)  Depression (PHQ2-9): Low Risk  (03/25/2022)  Financial Resource  Strain: Medium Risk (07/31/2022)  Physical Activity: Inactive (03/25/2022)  Social Connections: Moderately Isolated (03/22/2021)  Stress: No Stress Concern Present (03/25/2022)  Tobacco Use: Medium Risk (07/03/2022)    CCM Care Plan  Allergies  Allergen Reactions   Peanut-Containing Drug Products Anaphylaxis   Sulfonamide Derivatives Anaphylaxis   Amlodipine Swelling    Swelling in ankles   Eliquis [Apixaban] Other (See Comments)    Back/hip pain   Lisinopril Cough    cough   Xarelto [Rivaroxaban] Other (See Comments)    Back/hip pain    Medications Reviewed Today     Reviewed by Ronie Spies, CMA (Certified Medical Assistant) on 07/03/22 at 5  Med List Status: <None>   Medication Order Taking? Sig Documenting Provider Last Dose Status Informant  allopurinol (ZYLOPRIM) 100 MG tablet [id number] Yes Take 1 tablet (100 mg total) by mouth daily.  Patient taking differently: Take 100 mg by mouth daily as needed (Gout).   Laurey Morale, MD Taking Active Self, Spouse/Significant Other  amiodarone (PACERONE) 200 MG tablet [id number] Yes Taking one tablet by mouth daily  Patient taking differently: Take 200 mg by mouth every morning.   Fenton, Clint R, PA Taking Active Self, Spouse/Significant Other  amoxicillin (AMOXIL) 500 MG capsule [id number] Yes Take by mouth. [provider] Taking Active   aspirin 81 MG tablet [id number] Yes Take 1 tablet (81 mg total) by mouth daily. Magnant, Gerrianne Scale, PA-C Taking Active Spouse/Significant Other, Self  atorvastatin (LIPITOR) 20 MG tablet [id number] Yes TAKE ONE TABLET ONCE DAILY Laurey Morale, MD Taking Active Self, Spouse/Significant Other  benzonatate (TESSALON) 200 MG capsule [id number] Yes Take 1 capsule (200 mg total) by mouth 2 (two) times daily as needed for cough. Hunsucker, Bonna Gains, MD Taking Active Self, Spouse/Significant Other  cetirizine (ZYRTEC) 10 MG tablet [id number] Yes Take 10 mg by mouth 2 (two) times daily. [facility] [provider] Taking Active Self, Spouse/Significant Other  colchicine 0.6 MG tablet [id number] Yes Take 1 tablet (0.6 mg total) by mouth every 6 (six) hours as needed (gout). Laurey Morale, MD Taking Active Self, Spouse/Significant Other  dabigatran (PRADAXA) 150 MG CAPS capsule [id number] Yes TAKE 1 CAPSULE BY MOUTH TWICE  DAILY Laurey Morale, MD Taking Active   diphenhydrAMINE (BENADRYL)  25 MG tablet [id number] Yes Take 25 mg by mouth at bedtime.  [provider] Taking Active Spouse/Significant Other, Self  EPINEPHrine 0.3 mg/0.3 mL IJ SOAJ injection [id number] Yes Inject 0.3 mLs (0.3 mg total) into the muscle as needed for anaphylaxis. Kennith Gain, MD Taking Active Spouse/Significant Other, Self  fluorouracil (EFUDEX) 5 % cream [id number] No Apply 1 application topically 2 (two) times daily as needed (rash).  Patient not taking: Reported on 07/03/2022   [provider] Not Taking Active Spouse/Significant Other, Self  furosemide (LASIX) 80 MG tablet [id number] Yes Take 1 tablet (80 mg total) by mouth daily. Burnell Blanks, MD Taking Active   HYDROcodone bit-homatropine [facility]) 5-1.5 MG/5ML syrup [id number] Yes Take 5 mLs by mouth every 4 (four) hours as needed for cough. Laurey Morale, MD Taking Active Self, Spouse/Significant Other  isosorbide mononitrate (IMDUR) 30 MG 24 hr tablet [id number] Yes TAKE ONE TABLET ONCE DAILY Laurey Morale, MD Taking Active   ketotifen (ZADITOR) 0.025 % ophthalmic solution [id number] Yes Apply 1 drop to eye daily as needed (itchy eyes).  [provider] Taking Active Spouse/Significant Other, Self  levothyroxine (SYNTHROID) 100 MCG tablet [id number] Yes TAKE ONE TABLET BY MOUTH DAILY. Laurey Morale, MD Taking Active   meloxicam [facility]) 15 MG tablet [id number] Yes Take 1 tablet (15 mg total) by mouth daily for 21 days. Callie Fielding, MD Taking Active   methocarbamol (ROBAXIN) 500 MG tablet [id number]  Yes Take 1 tablet (500 mg total) by mouth 4 (four) times daily for 14 days. Callie Fielding, MD Taking Active   metoCLOPramide (REGLAN) 10 MG tablet [id number] Yes TAKE ONE TABLET BY MOUTH EVERY MORNING WITH BREAKFAST Laurey Morale, MD Taking Active   metolazone (ZAROXOLYN) 2.5 MG tablet [id number] Yes Take 1 tablet (2.5 mg total) by mouth 2 (two) times a week. Wednesday and Saturday Deboraha Sprang, MD Taking Active   metoprolol succinate (TOPROL-XL) 50 MG 24 hr tablet [id number] Yes Take 50 mg by mouth daily. Take with or immediately following a meal. [provider] Taking Active   montelukast (SINGULAIR) 10 MG tablet [id number] Yes TAKE 1 TABLET ONCE DAILY IN THE EVENING. Hunsucker, Bonna Gains, MD Taking Active Self, Spouse/Significant Other  Multiple Vitamins-Minerals (PRESERVISION AREDS PO) [id number] Yes Take 2 tablets by mouth every morning. [provider] Taking Active Spouse/Significant Other, Self  omeprazole (PRILOSEC) 20 MG capsule [id number] Yes TAKE (1) CAPSULE TWICE DAILY. Laurey Morale, MD Taking Active   potassium chloride SA (KLOR-CON M20) 20 MEQ tablet [id number] Yes Take 1 tablet (20 mEq total) by mouth daily. Take 1 tablet (20 mEq total) by mouth daily.  Patient taking differently: Take 20-40 mEq by mouth See admin instructions. Tale 40 meq on Wednesday and Saturday all the other days take 20 meq   Imogene Burn, PA-C Taking Active Self, Spouse/Significant Other  sacubitril-valsartan (ENTRESTO) 97-103 MG [id number] Yes Take 1 tablet by mouth 2 (two) times daily. Burnell Blanks, MD Taking Active   spironolactone (ALDACTONE) 25 MG tablet [id number] Yes TAKE ONE TABLET BY MOUTH EVERY DAY. Burnell Blanks, MD Taking Active Self, Spouse/Significant Other  triamcinolone cream (KENALOG) 0.1 % [id number] Yes Apply 1 application topically daily as needed for dry skin. [provider] Taking Active Spouse/Significant Other, Self           Med Note  Winona Legato Oct 09, 2018 12:40 PM)    valACYclovir (VALTREX) 500 MG tablet [id number]  Yes Take 500 mg by mouth 2 (two) times daily as needed (cold sores). [provider] Taking Active Spouse/Significant Other, Self            Patient Active Problem List   Diagnosis Date Noted   NSVT (nonsustained ventricular tachycardia) ([place]) 05/23/2022   Sinus node dysfunction ([place]) 05/23/2022   PVC's (premature ventricular contractions) 05/23/2022   Chronic combined systolic and diastolic heart failure ([place]) 03/12/2022   Biventricular implantable cardioverter-defibrillator (ICD) in situ 03/12/2022   Atypical atrial flutter ([place]) 01/30/2022   Paroxysmal atrial fibrillation ([place])    Persistent atrial fibrillation ([place]) 04/19/2021   Secondary hypercoagulable state ([place]) 04/19/2021   Raynaud's phenomenon without gangrene 07/11/2020   Vertigo 07/11/2020   Loosening of knee joint prosthesis ([place]) 05/18/2019   Cough 04/30/2019   Gastroesophageal reflux disease 04/30/2019   S/P ICD (internal cardiac defibrillator) procedure 05/03/2017   NICM (nonischemic cardiomyopathy) ([place]) 05/02/2017   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   Severe aortic valve stenosis 03/04/2017   Hypokalemia due to loss of potassium with diuresis 01/10/2017   Aortic stenosis 51/88/4166   Systolic heart failure ([place]) 01/10/2017   Acute on chronic diastolic CHF (congestive heart failure) ([place]) 01/07/2017   Hypothyroidism 12/23/2016   Prostate cancer ([place])    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   Edema of both legs 09/01/2014   Asthma, intermittent 09/01/2014   Aortic valve disorders 01/01/2011   Coronary artery disease involving native coronary artery of native heart without angina pectoris    GOUT 09/12/2009   CAD, AUTOLOGOUS BYPASS GRAFT 05/10/2009   MURMUR 05/08/2009   BRUIT 05/08/2009   MUSCLE CRAMPS 12/12/2008   KNEE SPRAIN 09/21/2008   Osteoarthritis 07/07/2008    CONTUSION, HIP 05/13/2008   BPH with urinary obstruction 02/01/2008   LOW BACK PAIN SYNDROME 02/01/2008   TESTOSTERONE DEFICIENCY 07/03/2007   Hyperlipidemia 07/03/2007   Essential hypertension 07/03/2007   MYOCARDIAL INFARCTION, HX OF 07/03/2007   Allergic rhinitis 07/03/2007   COLONIC POLYPS, HX OF 07/03/2007   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    Immunization History  Administered Date(s) Administered   Fluad Quad(high Dose 65+) 05/06/2019, 05/30/2020, 06/18/2021   Hep A / Hep B 04/06/2012, 10/07/2012   Hepatitis B 05/07/2012   Influenza Split 06/28/2013   Influenza Whole 06/05/2007, 05/13/2008, 06/06/2009, 05/30/2010   Influenza, High Dose Seasonal PF 05/15/2018, 05/31/2022   Influenza,inj,Quad PF,6+ Mos 05/20/2016   Influenza-Unspecified 05/25/2014, 07/08/2015, 05/06/2017   Moderna Covid-19 Vaccine Bivalent Booster 17yr & up 05/31/2022   PFIZER(Purple Top)SARS-COV-2 Vaccination 09/28/2019, 10/12/2019, 05/30/2020   Pfizer Covid-19 Vaccine Bivalent Booster 120yr& up 11/18/2021   Pneumococcal Conjugate-13 09/18/2015   Pneumococcal Polysaccharide-23 05/13/2008, 05/25/2014   Td 05/30/2010   Tdap 10/27/2020   Zoster Recombinat (Shingrix) 05/18/2018, 10/16/2018   Zoster, Live 10/21/2007   He is holding weight as far as things go and he is weighing almost every day now and not noticed any changes.  He is working on his life story right now and is finishing it up. 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  He started on this in 2012 when he had his knees replaced and has continued working on it ever since.  Patient reports his drug spend has been a lot this year. The Pradaxa is the most expensive and is paying $500 a quarter for it and the EnDelene Lolls also pretty expensive.  He attempted to apply for assistance before but never heard back about getting it.  Patient is checking BP every other day  and doesn't feel different with the low readings.    Conditions to be addressed/monitored:  Hypertension, Hyperlipidemia, Heart Failure, Coronary Artery Disease, GERD, Hypothyroidism, Gout, and Allergic Rhinitis  Conditions addressed this visit: Hypertension, heart failure, gout  Care Plan : CCM Pharmacy Care Plan  Updates made by Viona Gilmore, [place] since 07/31/2022 12:00 AM     Problem: Problem: Hypertension, Hyperlipidemia, Heart Failure, Coronary Artery Disease, and Allergic Rhinitis      Long-Range Goal: Patient-Specific Goal   Start Date: 02/14/2021  Expected End Date: 02/14/2022  Recent Progress: On track  Priority: High  Note:   Current Barriers:  Unable to independently afford treatment regimen Unable to independently monitor therapeutic efficacy  Pharmacist Clinical Goal(s):  Patient will verbalize ability to afford treatment regimen achieve adherence to monitoring guidelines and medication adherence to achieve therapeutic efficacy through collaboration with PharmD and provider.   Interventions: 1:1 collaboration with Laurey Morale, MD regarding development and update of comprehensive plan of care as evidenced by provider attestation and co-signature Inter-disciplinary care team collaboration (see longitudinal plan of care) Comprehensive medication review performed; medication list updated in electronic medical record  Hypertension (BP goal <130/80) -Controlled -Current treatment: Furosemide 80 mg 1 tablet daily - Appropriate, Effective, Safe, Accessible Isosorbide mononitrate 30 mg 1 tablet daily - Appropriate, Effective, Safe, Accessible Metoprolol succinate 50 mg 1 tablet daily with or immediately following a meal - Appropriate, Effective, Safe, Accessible Spironolactone 25 mg 1 tablet daily - Appropriate, Effective, Safe, Accessible -Medications previously tried: losartan  -Current home readings: 108/64, 122/73, 165/75, 124/63, 110/60, 125/76, 137/89, 128/68  (checking a couple days a week) -Current dietary habits: did not discuss -Current exercise habits: was walking some in [employer] -Denies hypotensive/hypertensive symptoms -Educated on Importance of home blood pressure monitoring; Proper BP monitoring technique; Symptoms of hypotension and importance of maintaining adequate hydration; -Counseled to monitor BP at home weekly, document, and provide log at future appointments -Counseled on diet and exercise extensively Recommended to continue current medication  Hyperlipidemia: (LDL goal < 70) -Controlled -Current treatment: Atorvastatin 20 mg 1 tablet daily - Appropriate, Effective, Safe, Accessible -Medications previously tried: none  -Current dietary patterns: did not discuss -Current exercise habits: did not discuss -Educated on Cholesterol goals;  Importance of limiting foods high in cholesterol; Exercise goal of 150 minutes per week; -Counseled on diet and exercise extensively Recommended to continue current medication  CAD (Goal: prevent heart events) -Controlled -Current treatment  Aspirin 81 mg 1 tablet daily - Appropriate, Effective, Safe, Accessible Pradaxa 150 mg 1 capsule twice daily - Appropriate, Effective, Safe, Accessible -Medications previously tried: n/a  -Recommended to continue current medication  Heart Failure (Goal: manage symptoms and prevent exacerbations) -Controlled -Last ejection fraction: 30-35% (Date: 04/2019) -HF type: Combined Systolic and Diastolic -NYHA Class: III (marked limitation of activity) -AHA HF Stage: C (Heart disease and symptoms present) -Current treatment: Furosemide 80 mg 1 tablet daily  - Appropriate, Effective, Safe, Accessible Metoprolol succinate 50 mg 1 tablet by mouth daily with or immediately following a meal - Appropriate, Effective, Safe, Accessible Entresto 97-103 mg 1 tablet twice daily - Appropriate, Effective, Safe, Query accessible Spironolactone 25 mg 1 tablet  daily - Appropriate, Effective, Safe, Accessible Potassium 20 mEq 1 tablet daily - Appropriate, Effective, Safe, Accessible Metolazone 2.5 mg 1 tablet twice a week - Appropriate, Effective, Safe, Accessible -Medications previously  tried: losartan  -Current home BP/HR readings: not checking regularly -Current dietary habits: did not discuss -Current exercise habits: does have an [employer] membership but plans to activate in January -Educated on Benefits of medications for managing symptoms and prolonging life Importance of weighing daily; if you gain more than 3 pounds in one day or 5 pounds in one week, call cardiologist -Counseled on diet and exercise extensively Recommended to continue current medication  Diabetes (A1c goal <7%) -Controlled -Current medications: No medications -Medications previously tried: none  -Current home glucose readings fasting glucose: does not need to check post prandial glucose: does not need to check -Denies hypoglycemic/hyperglycemic symptoms -Current meal patterns:  breakfast: did not discuss  lunch: did not discuss  dinner: did not discuss snacks: did not discuss drinks: did not discuss -Current exercise: did not discuss -Educated on A1c and blood sugar goals; -Counseled to check feet daily and get yearly eye exams -Counseled on diet and exercise extensively Recommended repeat A1c  GERD (Goal: minimize symptoms) -Controlled -Current treatment  Pepcid Complete (Famotidine + Calcium + Magnesium) 2 tablets at bedtime - Appropriate, Effective, Safe, Accessible Omeprazole 20 mg 1 capsule twice daily - Appropriate, Effective, Safe, Accessible Probiotic daily - Appropriate, Effective, Safe, Accessible -Medications previously tried: n/a  -Recommended to continue current medication  Hypothyroidism (Goal: TSH 0.35-4.5) -Uncontrolled -Current treatment  Levothyroxine 100 mcg 1 tablet daily - Appropriate, Query effective, Safe, Accessible -Medications  previously tried: none -Recommended to continue current medication Recommended repeat TSH.  Allergic rhinitis (Goal: minimize symptoms) -Not ideally controlled -Current treatment  Azelastine 0.15% 2 sprays into the nose in the morning and at bedtime - Appropriate, Effective, Safe, Accessible Flonase 2 sprays into both nostrils daily - Appropriate, Effective, Safe, Accessible Montelukast 10 mg 1 tablet in the evening - Appropriate, Effective, Safe, Accessible Benadryl 25 mg 1 tablet at bedtime - Appropriate, Effective, Query Safe, Accessible Cetirizine 10 mg - 1/2 tablet at bedtime - Appropriate, Effective, Safe, Accessible Benzonatate 200 mg 1 capsule every 8 hrs as needed for cough- Appropriate, Effective, Safe, Accessible -Medications previously tried: n/a  -Recommended limiting use of Benadryl and caution with using 2 antihistamines (cetirizine and Xyzal)  Gout (Goal: prevent flare ups and uric acid < 6) -Uncontrolled -Current treatment  Colchicine 0.6 mg tablet as needed - Appropriate, Effective, Safe, Accessible Allopurinol 100 mg 1 tablet daily - Appropriate, Query effective, Safe, Accessible -Medications previously tried: n/a  -Recommended to continue current medication Recommended repeat uric acid level and restarting allopurinol daily.  Health Maintenance -Vaccine gaps: COVID booster -Current therapy:  Triamcinolone 0.1% cream once daily as needed for dry skin Valtrex 500 mg 1 tablet twice daily as needed for cold sores -Educated on Cost vs benefit of each product must be carefully weighed by individual consumer -Patient is satisfied with current therapy and denies issues -Recommended to continue current medication  Patient Goals/Self-Care Activities Patient will:  - take medications as prescribed check blood pressure weekly, document, and provide at future appointments  Follow Up Plan: Telephone follow up appointment with care management team member scheduled for: 6  months       Medication Assistance: Application for  Entresto  medication assistance program. in process.  Anticipated assistance start date 08/30/22.  See plan of care for additional detail.  Compliance/Adherence/Medication fill history: Care Gaps: Last BP - 94/50 on 07/03/2022 Last A1C - 6.6 on 03/28/2021  Star-Rating Drugs: Atorvastatin 79m - last filled [facility]/17/2022 [facility] Patient's preferred pharmacy is:  [facility], [address] [place] [address] Phone: [phone] Fax: [facility], [address] [place] [address] Phone: [phone] Fax: [phone]   Uses pill box? Yes Pt endorses 99% compliance  We discussed: Current pharmacy is preferred with insurance plan and patient is satisfied with pharmacy services Patient decided to: Continue current medication management strategy  Care Plan and Follow Up Patient Decision:  Patient agrees to Care Plan and Follow-up.  Plan: Telephone follow up appointment with care management team member scheduled for:  6 months  [name], PharmD, [place] Pharmacist [place] [facility] [place] [phone]

## 2022-07-31 NOTE — Patient Instructions (Signed)
Hi [name],  It was great to catch up again! Let me know if you don't receive the [facility] patient assistance application in the mail. I do think you should qualify, it's just a matter of getting everything sent into the manufacturer.  Please reach out to me if you have any questions or need anything before our follow up!  Best, Maddie  [name], PharmD, [place] [facility] [place]   Visit Information   Goals Addressed   None    Patient Care Plan: CCM Pharmacy Care Plan     Problem Identified: Problem: Hypertension, Hyperlipidemia, Heart Failure, Coronary Artery Disease, and Allergic Rhinitis      Long-Range Goal: Patient-Specific Goal   Start Date: 02/14/2021  Expected End Date: 02/14/2022  Recent Progress: On track  Priority: High  Note:   Current Barriers:  Unable to independently afford treatment regimen Unable to independently monitor therapeutic efficacy  Pharmacist Clinical Goal(s):  Patient will verbalize ability to afford treatment regimen achieve adherence to monitoring guidelines and medication adherence to achieve therapeutic efficacy through collaboration with PharmD and provider.   Interventions: 1:1 collaboration with Laurey Morale, MD regarding development and update of comprehensive plan of care as evidenced by provider attestation and co-signature Inter-disciplinary care team collaboration (see longitudinal plan of care) Comprehensive medication review performed; medication list updated in electronic medical record  Hypertension (BP goal <130/80) -Controlled -Current treatment: Furosemide 80 mg 1 tablet daily - Appropriate, Effective, Safe, Accessible Isosorbide mononitrate 30 mg 1 tablet daily - Appropriate, Effective, Safe, Accessible Metoprolol succinate 50 mg 1 tablet daily with or immediately following a meal - Appropriate, Effective, Safe, Accessible Spironolactone 25 mg 1 tablet daily -  Appropriate, Effective, Safe, Accessible -Medications previously tried: losartan  -Current home readings: 108/64, 122/73, 165/75, 124/63, 110/60, 125/76, 137/89, 128/68 (checking a couple days a week) -Current dietary habits: did not discuss -Current exercise habits: was walking some in [employer] -Denies hypotensive/hypertensive symptoms -Educated on Importance of home blood pressure monitoring; Proper BP monitoring technique; Symptoms of hypotension and importance of maintaining adequate hydration; -Counseled to monitor BP at home weekly, document, and provide log at future appointments -Counseled on diet and exercise extensively Recommended to continue current medication  Hyperlipidemia: (LDL goal < 70) -Controlled -Current treatment: Atorvastatin 20 mg 1 tablet daily - Appropriate, Effective, Safe, Accessible -Medications previously tried: none  -Current dietary patterns: did not discuss -Current exercise habits: did not discuss -Educated on Cholesterol goals;  Importance of limiting foods high in cholesterol; Exercise goal of 150 minutes per week; -Counseled on diet and exercise extensively Recommended to continue current medication  CAD (Goal: prevent heart events) -Controlled -Current treatment  Aspirin 81 mg 1 tablet daily - Appropriate, Effective, Safe, Accessible Pradaxa 150 mg 1 capsule twice daily - Appropriate, Effective, Safe, Accessible -Medications previously tried: n/a  -Recommended to continue current medication  Heart Failure (Goal: manage symptoms and prevent exacerbations) -Controlled -Last ejection fraction: 30-35% (Date: 04/2019) -HF type: Combined Systolic and Diastolic -NYHA Class: III (marked limitation of activity) -AHA HF Stage: C (Heart disease and symptoms present) -Current treatment: Furosemide 80 mg 1 tablet daily  - Appropriate, Effective, Safe, Accessible Metoprolol succinate 50 mg 1 tablet by mouth daily with or immediately following a  meal - Appropriate, Effective, Safe, Accessible Entresto 97-103 mg 1 tablet twice daily - Appropriate, Effective, Safe, Query accessible Spironolactone 25 mg 1 tablet daily - Appropriate, Effective, Safe, Accessible Potassium 20 mEq 1 tablet daily - Appropriate, Effective, Safe,  Accessible Metolazone 2.5 mg 1 tablet twice a week - Appropriate, Effective, Safe, Accessible -Medications previously tried: losartan  -Current home BP/HR readings: not checking regularly -Current dietary habits: did not discuss -Current exercise habits: does have an [employer] membership but plans to activate in January -Educated on Benefits of medications for managing symptoms and prolonging life Importance of weighing daily; if you gain more than 3 pounds in one day or 5 pounds in one week, call cardiologist -Counseled on diet and exercise extensively Recommended to continue current medication  Diabetes (A1c goal <7%) -Controlled -Current medications: No medications -Medications previously tried: none  -Current home glucose readings fasting glucose: does not need to check post prandial glucose: does not need to check -Denies hypoglycemic/hyperglycemic symptoms -Current meal patterns:  breakfast: did not discuss  lunch: did not discuss  dinner: did not discuss snacks: did not discuss drinks: did not discuss -Current exercise: did not discuss -Educated on A1c and blood sugar goals; -Counseled to check feet daily and get yearly eye exams -Counseled on diet and exercise extensively Recommended repeat A1c  GERD (Goal: minimize symptoms) -Controlled -Current treatment  Pepcid Complete (Famotidine + Calcium + Magnesium) 2 tablets at bedtime - Appropriate, Effective, Safe, Accessible Omeprazole 20 mg 1 capsule twice daily - Appropriate, Effective, Safe, Accessible Probiotic daily - Appropriate, Effective, Safe, Accessible -Medications previously tried: n/a  -Recommended to continue current  medication  Hypothyroidism (Goal: TSH 0.35-4.5) -Uncontrolled -Current treatment  Levothyroxine 100 mcg 1 tablet daily - Appropriate, Query effective, Safe, Accessible -Medications previously tried: none -Recommended to continue current medication Recommended repeat TSH.  Allergic rhinitis (Goal: minimize symptoms) -Not ideally controlled -Current treatment  Azelastine 0.15% 2 sprays into the nose in the morning and at bedtime - Appropriate, Effective, Safe, Accessible Flonase 2 sprays into both nostrils daily - Appropriate, Effective, Safe, Accessible Montelukast 10 mg 1 tablet in the evening - Appropriate, Effective, Safe, Accessible Benadryl 25 mg 1 tablet at bedtime - Appropriate, Effective, Query Safe, Accessible Cetirizine 10 mg - 1/2 tablet at bedtime - Appropriate, Effective, Safe, Accessible Benzonatate 200 mg 1 capsule every 8 hrs as needed for cough- Appropriate, Effective, Safe, Accessible -Medications previously tried: n/a  -Recommended limiting use of Benadryl and caution with using 2 antihistamines (cetirizine and Xyzal)  Gout (Goal: prevent flare ups and uric acid < 6) -Uncontrolled -Current treatment  Colchicine 0.6 mg tablet as needed - Appropriate, Effective, Safe, Accessible Allopurinol 100 mg 1 tablet daily - Appropriate, Query effective, Safe, Accessible -Medications previously tried: n/a  -Recommended to continue current medication Recommended repeat uric acid level and restarting allopurinol daily.  Health Maintenance -Vaccine gaps: COVID booster -Current therapy:  Triamcinolone 0.1% cream once daily as needed for dry skin Valtrex 500 mg 1 tablet twice daily as needed for cold sores -Educated on Cost vs benefit of each product must be carefully weighed by individual consumer -Patient is satisfied with current therapy and denies issues -Recommended to continue current medication  Patient Goals/Self-Care Activities Patient will:  - take medications as  prescribed check blood pressure weekly, document, and provide at future appointments  Follow Up Plan: Telephone follow up appointment with care management team member scheduled for: 6 months       Patient verbalizes understanding of instructions and care plan provided today and agrees to view in [place]. Active MyChart status and patient understanding of how to access instructions and care plan via MyChart confirmed with patient.    The pharmacy team will reach out to the patient  again over the next 90 days.   Viona Gilmore, [facility]

## 2022-08-01 DIAGNOSIS — M545 Low back pain, unspecified: Secondary | ICD-10-CM | POA: Diagnosis not present

## 2022-08-02 ENCOUNTER — Telehealth: Payer: Self-pay

## 2022-08-02 NOTE — Progress Notes (Signed)
Per secure message, device clinic nurse Lavenia Atlas will send updated AT/AF to Dr [name] for review.  She advised if patient becomes symptomatic to call Afib clinic for appointment.  Will recheck report for AT/AF on 12/19.

## 2022-08-02 NOTE — Telephone Encounter (Signed)
Message received from [place], RN, noted increased AF burden on recent remote check. PatientAF burden at last OV on 05/24/22 was 2% and now 100% w/ controlled VR's. Patient reports asymptomatic and compliant with all medications including [place]. Advised I will forward to Dr. [name] for review. Patient agreeable to call if any symptoms arise.

## 2022-08-02 NOTE — Progress Notes (Signed)
EPIC Encounter for ICM Monitoring  Patient Name: Justin [name] is a 88 y.o. male Date: 08/02/2022 Primary Care Physican: Laurey Morale, MD Primary Cardiologist: Angelena Form Electrophysiologist: Vergie Living Pacing: 90.6% Effective 89.9%  01/08/2022 Weight: 260 lbs 06/17/2022 Weight: 272 lbs (baseline 269 lbs)   Since 08-Jul-2022 Monitored Time in AT/AF 24.0 hr/day (100.0%) Longest AT/AF 16 days            Spoke with patient and heart failure questions reviewed.  Transmission results reviewed.  Pt asymptomatic for fluid accumulation.  Reports feeling well at this time and voices no complaints.    Advised sent a message to device clinic regarding increase in burden.  He cannot tell if he is out of rhythm and has not missed any medication dosages.     Optivol thoracic impedance suggesting normal fluid levels.   AT/AF burden has increased from 0.1 hr/day (0.6%) per Oct report to 100% on 12/4 report.  Device clinic does not follow up on ATP treated episodes.   Prescribed: Furosemide 80 mg take 1 tablet every day.   Metolazone 2.5 mg Take 1 tablet by mouth twice a week Wednesday and Saturday (increased on 3/29). KLC 20 mEq Take 20-40 mEq by mouth See admin instructions. Tale 40 meq on Wednesday and Saturday all the other days take 20 meq Spironolactone 25 mg take 1 tablet daily   Labs: 04/01/2022 Creatinine 1.58, BUN 36, Potassium 4.5, Sodium 135, GFR 43 02/19/2022 Creatinine 1.43, BUN 33, Potassium 4.9, Sodium 134, GFR 48  01/30/2022 Creatinine 1.35, BUN 36, Potassium 5.1, Sodium 135  01/15/2022 Creatinine 1.48, BUN 40, Potassium 5.1, Sodium 135, GFR 46 01/01/2022 Creatinine 1.60, BUN 47, Potassium 4.6, Sodium 132, GFR 42 12/21/2021 Creatinine 1.81, BUN 51, Potassium 5.1, Sodium 135, GFR 36 12/05/2021 Creatinine 1.34, BUN 36, Potassium 5.3, Sodium 136, GFR 52 11/08/2021 Creatinine 1.30, BUN 33, Potassium 4.3, Sodium 138 11/02/2021 Creatinine 1.30, BUN 39, Potassium 4.7, Sodium 136, GFR  54  10/23/2021 Creatinine 1.29, BUN 31, Potassium 5.3, Sodium 134, GFR 54  A complete set of results can be found in Results Review.   Recommendations:  No changes and encouraged to call if experiencing any fluid symptoms.  Will call back if any recommendations from device clinic.     Follow-up plan: ICM clinic phone appointment on 09/09/2022.   91 day device clinic remote transmission 10/07/2022.     EP/Cardiology Office Visits:  01/13/2023 with Dr Angelena Form.  Recall 10/31/2022 with Dr [name].   Copy of ICM check sent to Dr. [name].    3 month ICM trend: 07/29/2022.    12-14 Month ICM trend:     [name], RN 08/02/2022 1:02 PM

## 2022-08-02 NOTE — Progress Notes (Signed)
Advised recommendations given by device clinic nurse.  Advised if he develops any changes such as fluid symptoms or change in heart rhythm to call back or to call Afib clinic.  Advised to send manual remote transmission on 12/18.  Will call back if Dr [name] has any recommendations after reviewing report.

## 2022-08-06 DIAGNOSIS — M545 Low back pain, unspecified: Secondary | ICD-10-CM | POA: Diagnosis not present

## 2022-08-08 ENCOUNTER — Other Ambulatory Visit: Payer: Self-pay | Admitting: Cardiovascular Disease

## 2022-08-08 MED ORDER — ENTRESTO 97-103 MG PO TABS: 1.0000 | ORAL_TABLET | Freq: Two times a day (BID) | ORAL | 3 refills | Status: DC

## 2022-08-12 ENCOUNTER — Ambulatory Visit (INDEPENDENT_AMBULATORY_CARE_PROVIDER_SITE_OTHER): Payer: Medicare Other

## 2022-08-12 DIAGNOSIS — I5042 Chronic combined systolic (congestive) and diastolic (congestive) heart failure: Secondary | ICD-10-CM

## 2022-08-12 DIAGNOSIS — Z95810 Presence of automatic (implantable) cardiac defibrillator: Secondary | ICD-10-CM

## 2022-08-13 DIAGNOSIS — M545 Low back pain, unspecified: Secondary | ICD-10-CM | POA: Diagnosis not present

## 2022-08-13 NOTE — Progress Notes (Signed)
See 08/02/2022 phone note for any recommendations from Dr [name].

## 2022-08-13 NOTE — Progress Notes (Signed)
-----   Message -----  From: [name], RN  Sent: 08/13/2022  11:01 AM EST  To: Juluis Mire, RN  Subject: Afib                                           Maralyn Sago,   This patient's [facility] report suggesting he has been back in Afib since mid November.  He is taking Amiodarone as prescribed and is asymptomatic.  Does he need to come back to Afib clinic to be seen?    Thanks  Sharman Cheek, ICM device clinic    Juluis Mire, RN  Tynesia Harral Panda, RN Yes he would need to be seen by [place] or k[place]. It looks like device clinic sent something to dr [name] on 12/8 regarding his afib burden but he hadn't responded to it. Run it by Dr [name] and you can let me know what he prefers Thanks Stacy

## 2022-08-13 NOTE — Progress Notes (Addendum)
EPIC Encounter for ICM Monitoring  Patient Name: Justin [name] is a 88 y.o. male Date: 08/13/2022 Primary Care Physican: Laurey Morale, MD Primary Cardiologist: Angelena Form Electrophysiologist: Vergie Living Pacing: 91.0% Effective 90.6%  01/08/2022 Weight: 260 lbs 06/17/2022 Weight: 272 lbs (baseline 269 lbs) 08/13/2022 Weight: 272 lbs   Since 29-Jul-2022 Treated AT/AF 10 Monitored Time in AT/AF 24.0 hr/day (99.9%) Longest AT/AF 10 days   Spoke with patient and heart failure questions reviewed.  Transmission results reviewed.  Pt asymptomatic for fluid accumulation.  Reports feeling well at this time and voices no complaints.       Optivol thoracic impedance suggesting possible fluid accumulation starting 12/12.   AT/AF burden has increased from 0.1 hr/day (0.6%) per Oct report to 99.9% on 12/18 report.  Device clinic does not follow up on ATP treated episodes.   Prescribed: Furosemide 80 mg take 1 tablet every day.   Metolazone 2.5 mg Take 1 tablet by mouth twice a week Wednesday and Saturday (increased on 3/29). KLC 20 mEq Take 20-40 mEq by mouth See admin instructions. Tale 40 meq on Wednesday and Saturday all the other days take 20 meq Spironolactone 25 mg take 1 tablet daily   Labs: 04/01/2022 Creatinine 1.58, BUN 36, Potassium 4.5, Sodium 135, GFR 43 02/19/2022 Creatinine 1.43, BUN 33, Potassium 4.9, Sodium 134, GFR 48  01/30/2022 Creatinine 1.35, BUN 36, Potassium 5.1, Sodium 135  01/15/2022 Creatinine 1.48, BUN 40, Potassium 5.1, Sodium 135, GFR 46 01/01/2022 Creatinine 1.60, BUN 47, Potassium 4.6, Sodium 132, GFR 42 12/21/2021 Creatinine 1.81, BUN 51, Potassium 5.1, Sodium 135, GFR 36 12/05/2021 Creatinine 1.34, BUN 36, Potassium 5.3, Sodium 136, GFR 52 11/08/2021 Creatinine 1.30, BUN 33, Potassium 4.3, Sodium 138 11/02/2021 Creatinine 1.30, BUN 39, Potassium 4.7, Sodium 136, GFR 54  10/23/2021 Creatinine 1.29, BUN 31, Potassium 5.3, Sodium 134, GFR 54  A complete set  of results can be found in Results Review.   Recommendations:  No changes and encouraged to call if experiencing any fluid symptoms.     Follow-up plan: ICM clinic phone appointment on 08/28/2022 to recheck fluid levels.   91 day device clinic remote transmission 10/07/2022.     EP/Cardiology Office Visits:  01/13/2023 with Dr Angelena Form.  Recall 10/31/2022 with Dr [name].   Copy of ICM check sent to Dr. [name] and Dr Angelena Form as Juluis Rainier and review.   3 month ICM trend: 08/12/2022.    12-14 Month ICM trend:     [name], RN 08/13/2022 10:27 AM

## 2022-08-20 ENCOUNTER — Other Ambulatory Visit: Payer: Self-pay | Admitting: Family Medicine

## 2022-08-20 ENCOUNTER — Other Ambulatory Visit: Payer: Self-pay | Admitting: Pulmonary Disease

## 2022-08-20 DIAGNOSIS — K219 Gastro-esophageal reflux disease without esophagitis: Secondary | ICD-10-CM

## 2022-08-20 NOTE — Progress Notes (Signed)
Remote ICD transmission.   

## 2022-08-21 NOTE — Telephone Encounter (Signed)
Called the patient without any appreciable benefit of sinus  so will stop amiodarone.   And plan to see him 3 months

## 2022-08-22 ENCOUNTER — Encounter: Payer: Self-pay | Admitting: Internal Medicine

## 2022-08-22 NOTE — Telephone Encounter (Signed)
error 

## 2022-08-25 DIAGNOSIS — I5040 Unspecified combined systolic (congestive) and diastolic (congestive) heart failure: Secondary | ICD-10-CM | POA: Diagnosis not present

## 2022-08-25 DIAGNOSIS — I110 Hypertensive heart disease with heart failure: Secondary | ICD-10-CM

## 2022-08-25 DIAGNOSIS — E039 Hypothyroidism, unspecified: Secondary | ICD-10-CM | POA: Diagnosis not present

## 2022-08-25 DIAGNOSIS — E1159 Type 2 diabetes mellitus with other circulatory complications: Secondary | ICD-10-CM

## 2022-08-25 DIAGNOSIS — E785 Hyperlipidemia, unspecified: Secondary | ICD-10-CM

## 2022-08-28 ENCOUNTER — Ambulatory Visit (INDEPENDENT_AMBULATORY_CARE_PROVIDER_SITE_OTHER): Payer: Medicare Other

## 2022-08-28 DIAGNOSIS — I5042 Chronic combined systolic (congestive) and diastolic (congestive) heart failure: Secondary | ICD-10-CM

## 2022-08-28 DIAGNOSIS — Z95810 Presence of automatic (implantable) cardiac defibrillator: Secondary | ICD-10-CM

## 2022-08-28 NOTE — Progress Notes (Signed)
EPIC Encounter for ICM Monitoring  Patient Name: Justin [name] is a 89 y.o. male Date: 08/28/2022 Primary Care Physican: Laurey Morale, MD Primary Cardiologist: Angelena Form Electrophysiologist: Vergie Living Pacing: 91.9% Effective 91.5%  01/08/2022 Weight: 260 lbs 06/17/2022 Weight: 272 lbs (baseline 269 lbs) 08/13/2022 Weight: 272 lbs   Since 12-Aug-2022 Treated AT/AF 142 Monitored AT/AF 20 Time in AT/AF 23.8 hr/day (99.1%) Longest AT/AF 6 days   Spoke with patient and heart failure questions reviewed.  Transmission results reviewed.  Pt asymptomatic for fluid accumulation.  Reports feeling well at this time and voices no complaints.     Optivol thoracic impedance suggesting fluid levels returning close to baseline.   AT/AF has been addressed by Dr [name].  Device clinic does not follow up on ATP treated episodes.   Prescribed: Furosemide 80 mg take 1 tablet every day.   Metolazone 2.5 mg Take 1 tablet by mouth twice a week Wednesday and Saturday (increased on 3/29). KLC 20 mEq Take 20-40 mEq by mouth See admin instructions. Tale 40 meq on Wednesday and Saturday all the other days take 20 meq Spironolactone 25 mg take 1 tablet daily   Labs: 04/01/2022 Creatinine 1.58, BUN 36, Potassium 4.5, Sodium 135, GFR 43 02/19/2022 Creatinine 1.43, BUN 33, Potassium 4.9, Sodium 134, GFR 48  01/30/2022 Creatinine 1.35, BUN 36, Potassium 5.1, Sodium 135  01/15/2022 Creatinine 1.48, BUN 40, Potassium 5.1, Sodium 135, GFR 46 01/01/2022 Creatinine 1.60, BUN 47, Potassium 4.6, Sodium 132, GFR 42 12/21/2021 Creatinine 1.81, BUN 51, Potassium 5.1, Sodium 135, GFR 36 12/05/2021 Creatinine 1.34, BUN 36, Potassium 5.3, Sodium 136, GFR 52 11/08/2021 Creatinine 1.30, BUN 33, Potassium 4.3, Sodium 138 11/02/2021 Creatinine 1.30, BUN 39, Potassium 4.7, Sodium 136, GFR 54  10/23/2021 Creatinine 1.29, BUN 31, Potassium 5.3, Sodium 134, GFR 54  A complete set of results can be found in Results Review.    Recommendations:  No changes and encouraged to call if experiencing any fluid symptoms.     Follow-up plan: ICM clinic phone appointment on 09/23/2022.   91 day device clinic remote transmission 10/07/2022.     EP/Cardiology Office Visits:  01/13/2023 with Dr Angelena Form.  11/13/2022 with Dr [name].   Copy of ICM check sent to Dr. [name]  3 month ICM trend: 08/28/2021.    12-14 Month ICM trend:     [name], RN 08/28/2022 7:40 AM

## 2022-08-30 NOTE — Addendum Note (Signed)
Addended by: Thora Lance on: 08/30/2022 01:18 PM   Modules accepted: Orders

## 2022-08-30 NOTE — Telephone Encounter (Signed)
Spoke with pt who verifies he has stopped Amiodarone.  Medication removed from [facility].  Pt is scheduled to see Dr [name] 11/13/2022 and plans to keep that appointment.

## 2022-08-30 NOTE — Telephone Encounter (Signed)
Attempted phone call to pt and left voicemail message to contact RN at [phone]. 

## 2022-09-02 DIAGNOSIS — H353221 Exudative age-related macular degeneration, left eye, with active choroidal neovascularization: Secondary | ICD-10-CM | POA: Diagnosis not present

## 2022-09-05 DIAGNOSIS — M545 Low back pain, unspecified: Secondary | ICD-10-CM | POA: Diagnosis not present

## 2022-09-06 ENCOUNTER — Ambulatory Visit: Payer: Medicare Other | Admitting: Orthopedic Surgery

## 2022-09-06 ENCOUNTER — Telehealth: Payer: Self-pay

## 2022-09-06 ENCOUNTER — Ambulatory Visit (INDEPENDENT_AMBULATORY_CARE_PROVIDER_SITE_OTHER): Payer: Medicare Other

## 2022-09-06 DIAGNOSIS — M545 Low back pain, unspecified: Secondary | ICD-10-CM

## 2022-09-06 MED ORDER — METHOCARBAMOL 500 MG PO TABS: 500.0000 mg | ORAL_TABLET | Freq: Four times a day (QID) | ORAL | 0 refills | Status: DC

## 2022-09-06 MED ORDER — OXYCODONE HCL 5 MG PO TABS: 5.0000 mg | ORAL_TABLET | ORAL | 0 refills | Status: AC | PRN

## 2022-09-06 NOTE — Progress Notes (Signed)
Orthopedic Spine Surgery Office Note  Assessment: Patient is a 89 y.o. male with right lumbar paraspinal muscle pain after a fall   Plan: -Explained that initially conservative treatment is tried as a significant number of patients may experience relief with these treatment modalities. Discussed that the conservative treatments include:  -activity modification  -physical therapy  -over the counter pain medications  -medrol dosepak  -lumbar steroid injections -Patient has home exercises currently and is working with physical therapy.  I told him to take a couple of days off and then gradually work back into his home exercises and physical therapy -I prescribed Robaxin and a short course of oxycodone to help with his acute pain -He can continue to use Tylenol for pain relief -A Toradol injection was provided to him today in the office -Discussed possible intramuscular steroid injection if he still having pain before his daughter's wedding in about 2 weeks -Patient should return to office on an as-needed basis   Patient expressed understanding of the plan and all questions were answered to the patient's satisfaction.   Intramuscular Toradol injection note: After discussing the risk, benefits, and alternatives of an intramuscular Toradol injection, patient elected to proceed with that injection today.  The right gluteus muscle was prepped with an alcohol based prep.  A 20-gauge needle was used to inject 60 mg of Toradol into the gluteus muscle under standard sterile technique.  The needle was withdrawn.  A Band-Aid was applied.  Patient tolerated the procedure well.  ___________________________________________________________________________  History: Patient is a 89 y.o. male who has been previously seen in the office for low back and right-sided paraspinal pain. Since the last visit, his symptoms had been improving.  However, he was at the dentist yesterday and had a fall off a rolling stool.   He landed onto his backside.  He noted acute onset of right sided lower back pain in the area of the paraspinal muscles.  Pain is worse with activity and improves with rest.  He has been trying ice, heat, rest, Tylenol.  He has not noticed significant improvement over the last 24 hours.  He was instructed by his physical therapist come here for further evaluation.  No pain rating down the legs.  Denies paresthesias and numbness.  Previous treatments: Ice, heat, rest, Tylenol  Physical Exam:  General: no acute distress, appears stated age Neurologic: alert, answering questions appropriately, following commands Respiratory: unlabored breathing on room air, symmetric chest rise Psychiatric: appropriate affect, normal cadence to speech   MSK (spine):  -Strength exam      Left  Right EHL    4/5  4/5 TA    5/5  5/5 GSC    5/5  5/5 Knee extension  5/5  5/5 Hip flexion   5/5  5/5  -Sensory exam    Sensation intact to light touch in L3-S1 nerve distributions of bilateral lower extremities  -Straight leg raise: negative -Contralateral straight leg raise: negative -Negative logroll bilaterally -Clonus: no beats bilaterally  -Tender to palpation over the right paraspinal muscles in the lumbar region.  No other tenderness palpation over the lumbar spine.  No tenderness palpation over the midline lumbar or thoracic spine.  Imaging: XR of the lumbar spine from 09/06/2022 was independently reviewed and interpreted, showing L4/5 spondylolisthesis that appears stable. No acute osseous abnormality.  Disc loss at multiple levels but most pronounced at L2/3.  Decreased lumbar lordosis.    Patient name: Justin [name] Patient MRN: [id number] Date of visit:  09/06/22  

## 2022-09-06 NOTE — Telephone Encounter (Signed)
Patients daughter called today begging for an appt today with Laurance Flatten stating fathers back is in increased pain since going to the dentist yesterday and them putting heavy xray vest on him Requesting a call back [phone]

## 2022-09-10 ENCOUNTER — Telehealth: Payer: Self-pay | Admitting: Orthopedic Surgery

## 2022-09-10 ENCOUNTER — Encounter: Payer: Self-pay | Admitting: Orthopedic Surgery

## 2022-09-10 NOTE — Telephone Encounter (Signed)
Patient daughter said that Dr Laurance Flatten said to work her dad in for Friday. Please call pt daughter with the appointment [phone]

## 2022-09-11 NOTE — Telephone Encounter (Signed)
Coming in @ 815 09/13/22

## 2022-09-13 ENCOUNTER — Ambulatory Visit: Payer: Medicare Other | Admitting: Orthopedic Surgery

## 2022-09-13 DIAGNOSIS — M5489 Other dorsalgia: Secondary | ICD-10-CM | POA: Diagnosis not present

## 2022-09-13 MED ORDER — METHOCARBAMOL 500 MG PO TABS: 500.0000 mg | ORAL_TABLET | Freq: Four times a day (QID) | ORAL | 0 refills | Status: DC

## 2022-09-13 MED ORDER — OXYCODONE HCL 5 MG PO TABS: 5.0000 mg | ORAL_TABLET | ORAL | 0 refills | Status: AC | PRN

## 2022-09-13 NOTE — Progress Notes (Signed)
Orthopedic Spine Surgery Office Note   Assessment: Patient is a 89 y.o. male with right lumbar paraspinal muscle pain after a fall     Plan: -Explained that initially conservative treatment is tried as a significant number of patients may experience relief with these treatment modalities. Discussed that the conservative treatments include:             -activity modification             -physical therapy             -over the counter pain medications             -medrol dosepak             -lumbar steroid injections -Patient has home exercises currently and has been working with physical therapy.  Still hold the PT and then gradually work back into his home exercises and physical therapy as pain subsides -I prescribed Robaxin and a short course of oxycodone to help with his acute pain, can continue to take for the wedding -He can continue to use Tylenol for pain relief -IM steroid injection done today -Patient should return to office on an as-needed basis, if he returns will get AP/lateral lumbar     Patient expressed understanding of the plan and all questions were answered to the patient's satisfaction.    Intramuscular steroid injection note: After discussing the risk, benefits, and alternatives of an intramuscular cortisone injection, patient elected to proceed with that injection today.  The left gluteus muscle was prepped with an alcohol based prep.  A 20-gauge needle was used to inject '80mg'$  of depomedrol into the gluteus muscle under standard sterile technique.  The needle was withdrawn.  A Band-Aid was applied.  Patient tolerated the procedure well.   ___________________________________________________________________________   History: Patient is a 89 y.o. male who has been previously seen in the office for low back and right-sided paraspinal pain. Was last seen on 09/06/2022. Had a flare of pain after being at the dentist's office. He was prescribed medication and an IM toradol  injection was done to help with his pain. His pain has been about the same. It moves around in terms of location but is most consistently in the right paraspinal muscle. Does not have much pain when sitting or laying down. He is concerned because his daughter's wedding is this weekend and he wants to be able to walk her down the aisle. His goal is to make have the pain be tolerable for his daughter's wedding. Still no radiating leg pain. Has not noticed any weakness.    Previous treatments: Ice, heat, rest, Tylenol, oxycodone, toradol IM   Physical Exam:   General: no acute distress, appears stated age Neurologic: alert, answering questions appropriately, following commands Respiratory: unlabored breathing on room air, symmetric chest rise Psychiatric: appropriate affect, normal cadence to speech     MSK (spine):   -Strength exam                                                   Left                  Right EHL  4/5                  4/5 TA                                 5/5                  5/5 GSC                             5/5                  5/5 Knee extension            5/5                  5/5 Hip flexion                    5/5                  5/5   -Sensory exam                           Sensation intact to light touch in L3-S1 nerve distributions of bilateral lower extremities   -Straight leg raise: negative -Contralateral straight leg raise: negative -Negative logroll bilaterally -Clonus: no beats bilaterally   -Tender to palpation over the right paraspinal muscles in the lumbar region.  No other tenderness palpation over the lumbar spine.  No tenderness palpation over the midline lumbar or thoracic spine.   Imaging: XR of the lumbar spine from 09/06/2022 was independently reviewed and interpreted, showing L4/5 spondylolisthesis that appears stable. No acute osseous abnormality.  Disc loss at multiple levels but most pronounced at L2/3.   Decreased lumbar lordosis.    Patient name: Justin [name] Patient MRN: [id number] Date of visit: 09/13/22

## 2022-09-16 ENCOUNTER — Encounter: Payer: Medicare Other | Admitting: Family Medicine

## 2022-09-23 ENCOUNTER — Ambulatory Visit: Payer: Medicare Other | Attending: Internal Medicine

## 2022-09-23 DIAGNOSIS — I5042 Chronic combined systolic (congestive) and diastolic (congestive) heart failure: Secondary | ICD-10-CM | POA: Diagnosis not present

## 2022-09-23 DIAGNOSIS — Z95810 Presence of automatic (implantable) cardiac defibrillator: Secondary | ICD-10-CM | POA: Diagnosis not present

## 2022-09-24 ENCOUNTER — Telehealth: Payer: Self-pay

## 2022-09-24 NOTE — Telephone Encounter (Signed)
**Note De-Identified Shakeila Pfarr Obfuscation** The pts completed [employer] application for Entresto assistance was left at the office with documents.  I have completed the providers page of his application and have e-mailed all to Dr Camillia Herter nurse so she can print a Entresto 97-103 mg for #180 with 4 refills, obtain Dr Camillia Herter signature while in clinic tomorrow, on it and the RX, date it, And to then fax all to [employer] at the fax number written on the cover letter included.

## 2022-09-24 NOTE — Telephone Encounter (Signed)
Paperwork signed and faxed to [employer].

## 2022-09-25 DIAGNOSIS — M545 Low back pain, unspecified: Secondary | ICD-10-CM | POA: Diagnosis not present

## 2022-09-27 DIAGNOSIS — M545 Low back pain, unspecified: Secondary | ICD-10-CM | POA: Diagnosis not present

## 2022-09-27 NOTE — Progress Notes (Signed)
EPIC Encounter for ICM Monitoring  Patient Name: Justin [name] is a 89 y.o. male Date: 09/27/2022 Primary Care Physican: Laurey Morale, MD Primary Cardiologist: Angelena Form Electrophysiologist: Vergie Living Pacing: 89.39% Effective 88.1%  01/08/2022 Weight: 260 lbs 06/17/2022 Weight: 272 lbs (baseline 269 lbs) 08/13/2022 Weight: 272 lbs 09/27/2022 Weight: 270 lbs   Since 09-Sep-2022 Treated AT/AF   38 Monitored AT/AF 3 Time in AT/AF 24.0 hr/day (99.9%) Longest AT/AF 5 days   Spoke with patient and heart failure questions reviewed.  Transmission results reviewed.  Pt asymptomatic for fluid accumulation.  Reports feeling well at this time and voices no complaints.   His fluid intake with less than normal during high impedance.    Optivol thoracic impedance suggesting possible dryness starting 1/16 and trending back toward baseline 1/29.  Device clinic does not follow up on ATP treated episodes.   Prescribed: Furosemide 80 mg take 1 tablet every day.   Metolazone 2.5 mg Take 1 tablet by mouth twice a week Wednesday and Saturday (increased on 3/29). KLC 20 mEq Take 20-40 mEq by mouth See admin instructions. Tale 40 meq on Wednesday and Saturday all the other days take 20 meq Spironolactone 25 mg take 1 tablet daily   Labs: 04/01/2022 Creatinine 1.58, BUN 36, Potassium 4.5, Sodium 135, GFR 43 02/19/2022 Creatinine 1.43, BUN 33, Potassium 4.9, Sodium 134, GFR 48  01/30/2022 Creatinine 1.35, BUN 36, Potassium 5.1, Sodium 135  01/15/2022 Creatinine 1.48, BUN 40, Potassium 5.1, Sodium 135, GFR 46 01/01/2022 Creatinine 1.60, BUN 47, Potassium 4.6, Sodium 132, GFR 42 12/21/2021 Creatinine 1.81, BUN 51, Potassium 5.1, Sodium 135, GFR 36 12/05/2021 Creatinine 1.34, BUN 36, Potassium 5.3, Sodium 136, GFR 52 11/08/2021 Creatinine 1.30, BUN 33, Potassium 4.3, Sodium 138 11/02/2021 Creatinine 1.30, BUN 39, Potassium 4.7, Sodium 136, GFR 54  10/23/2021 Creatinine 1.29, BUN 31, Potassium 5.3, Sodium  134, GFR 54  A complete set of results can be found in Results Review.   Recommendations:  No changes and encouraged to call if experiencing any fluid symptoms.  His fluid intake has been better this week.     Follow-up plan: ICM clinic phone appointment on 10/28/2022.   91 day device clinic remote transmission 10/07/2022.     EP/Cardiology Office Visits:  01/13/2023 with Dr Angelena Form.  11/13/2022 with Dr [name].   Copy of ICM check sent to Dr. [name].   3 month ICM trend: 09/23/2022.    12-14 Month ICM trend:     [name], RN 09/27/2022 10:45 AM

## 2022-10-01 DIAGNOSIS — M545 Low back pain, unspecified: Secondary | ICD-10-CM | POA: Diagnosis not present

## 2022-10-01 NOTE — Telephone Encounter (Signed)
**Note De-Identified Justin [name] Obfuscation** Letter received from [employer] Rayane Gallardo fax stating that they need the pts proof of income-first 2 pages of his most recent tax return (e.g. 1040).  I called the pt to make him aware and he states that either he or his daughter will drop his proof of income off in the front office and he is aware that I will fax it to [employer] for him.  He thanked me for calling to let him know of this need.

## 2022-10-04 DIAGNOSIS — M545 Low back pain, unspecified: Secondary | ICD-10-CM | POA: Diagnosis not present

## 2022-10-04 NOTE — Telephone Encounter (Addendum)
**Note De-Identified Armend Hochstatter Obfuscation** The pts proof of income from 2022 was left at the office. I have faxed to [employer] as they requested. Fax: Tx 'ok' Report CONE_EMAIL-to-Fax  Ayana Imhof, Mardene Celeste This message was sent Elizabelle Fite [facility], a product from [employer] http://www.biscom.com/  [employer] pioneered [employer] and continues to innovate the most advanced and intelligent fax and secure messaging solutions for enterprises. www.biscom.com  -------Fax Transmission Report-------  To:               Recipient at [id number] Subject:          Fw: Scan from [place] Result:           The transmission was successful. Explanation:      All Pages Ok Pages Sent:       3 Connect Time:     1 minutes, 32 seconds Transmit Time:    10/04/2022 11:49 Transfer Rate:    14400 Status Code:      0000 Retry Count:      0 Job Id:           [id number] Unique [id number] Fax Line:         16 Fax Server:       [id number]

## 2022-10-07 ENCOUNTER — Ambulatory Visit: Payer: Medicare Other

## 2022-10-07 DIAGNOSIS — I5042 Chronic combined systolic (congestive) and diastolic (congestive) heart failure: Secondary | ICD-10-CM | POA: Diagnosis not present

## 2022-10-07 DIAGNOSIS — I428 Other cardiomyopathies: Secondary | ICD-10-CM

## 2022-10-08 ENCOUNTER — Telehealth: Payer: Self-pay

## 2022-10-08 DIAGNOSIS — M545 Low back pain, unspecified: Secondary | ICD-10-CM | POA: Diagnosis not present

## 2022-10-08 LAB — CUP PACEART REMOTE DEVICE CHECK
Battery Remaining Longevity: 6 mo
Battery Voltage: 2.83 V
Brady Statistic RA Percent Paced: 3.47 %
Brady Statistic RV Percent Paced: 82.09 %
Date Time Interrogation Session: 20240211072205
HighPow Impedance: 79 Ohm
Implantable Lead Connection Status: 753985
Implantable Lead Connection Status: 753985
Implantable Lead Connection Status: 753985
Implantable Lead Implant Date: 20180907
Implantable Lead Implant Date: 20180907
Implantable Lead Implant Date: 20180907
Implantable Lead Location: 753858
Implantable Lead Location: 753859
Implantable Lead Location: 753860
Implantable Lead Model: 4396
Implantable Lead Model: 5076
Implantable Pulse Generator Implant Date: 20180907
Lead Channel Impedance Value: 342 Ohm
Lead Channel Impedance Value: 380 Ohm
Lead Channel Impedance Value: 456 Ohm
Lead Channel Impedance Value: 570 Ohm
Lead Channel Impedance Value: 646 Ohm
Lead Channel Impedance Value: 988 Ohm
Lead Channel Pacing Threshold Amplitude: 0.75 V
Lead Channel Pacing Threshold Amplitude: 0.875 V
Lead Channel Pacing Threshold Amplitude: 1.625 V
Lead Channel Pacing Threshold Pulse Width: 0.4 ms
Lead Channel Pacing Threshold Pulse Width: 0.4 ms
Lead Channel Pacing Threshold Pulse Width: 1 ms
Lead Channel Sensing Intrinsic Amplitude: 0.75 mV
Lead Channel Sensing Intrinsic Amplitude: 0.75 mV
Lead Channel Sensing Intrinsic Amplitude: 9.875 mV
Lead Channel Sensing Intrinsic Amplitude: 9.875 mV
Lead Channel Setting Pacing Amplitude: 1.75 V
Lead Channel Setting Pacing Amplitude: 2 V
Lead Channel Setting Pacing Amplitude: 2.5 V
Lead Channel Setting Pacing Pulse Width: 0.4 ms
Lead Channel Setting Pacing Pulse Width: 1 ms
Lead Channel Setting Sensing Sensitivity: 0.3 mV
Zone Setting Status: 755011

## 2022-10-08 NOTE — Telephone Encounter (Signed)
Scheduled remote reviewed. Normal device function.   Known AF, 0 of 20 pace terminated, controlled rates, persistent AF, Pradaxa Battery estimated 3mo- route to triage Next remote to be determined LA  LM for patient to call back and make aware of monthly remote monitoring. Appears he has appointment with Dr. KCaryl Comesin March.  Also, set patient's remote appts to monthly for ongoing monitoring.

## 2022-10-08 NOTE — Telephone Encounter (Signed)
I spoke with the patient and let him know he has 6 months on the battery. I told him we now have him on monthly battery checks.

## 2022-10-09 ENCOUNTER — Telehealth: Payer: Self-pay | Admitting: Cardiovascular Disease

## 2022-10-09 NOTE — Telephone Encounter (Signed)
Pt c/o medication issue:  1. Name of Medication: sacubitril-valsartan (ENTRESTO) 97-103 MG   2. How are you currently taking this medication (dosage and times per day)?   3. Are you having a reaction (difficulty breathing--STAT)?   4. What is your medication issue? Patient called stating he has been approved for patient assistance for Entresto through [employer]. He needs a script sent to them.

## 2022-10-10 ENCOUNTER — Ambulatory Visit (INDEPENDENT_AMBULATORY_CARE_PROVIDER_SITE_OTHER): Payer: Medicare Other | Admitting: Family Medicine

## 2022-10-10 ENCOUNTER — Encounter: Payer: Self-pay | Admitting: Family Medicine

## 2022-10-10 VITALS — BP 84/42 | HR 92 | Temp 97.4°F | Ht 66.0 in | Wt 278.0 lb

## 2022-10-10 DIAGNOSIS — M109 Gout, unspecified: Secondary | ICD-10-CM | POA: Diagnosis not present

## 2022-10-10 DIAGNOSIS — J4520 Mild intermittent asthma, uncomplicated: Secondary | ICD-10-CM

## 2022-10-10 DIAGNOSIS — I10 Essential (primary) hypertension: Secondary | ICD-10-CM | POA: Diagnosis not present

## 2022-10-10 DIAGNOSIS — I484 Atypical atrial flutter: Secondary | ICD-10-CM

## 2022-10-10 DIAGNOSIS — I350 Nonrheumatic aortic (valve) stenosis: Secondary | ICD-10-CM

## 2022-10-10 DIAGNOSIS — E785 Hyperlipidemia, unspecified: Secondary | ICD-10-CM | POA: Diagnosis not present

## 2022-10-10 DIAGNOSIS — M159 Polyosteoarthritis, unspecified: Secondary | ICD-10-CM

## 2022-10-10 DIAGNOSIS — I480 Paroxysmal atrial fibrillation: Secondary | ICD-10-CM | POA: Diagnosis not present

## 2022-10-10 DIAGNOSIS — R600 Localized edema: Secondary | ICD-10-CM

## 2022-10-10 DIAGNOSIS — I5022 Chronic systolic (congestive) heart failure: Secondary | ICD-10-CM

## 2022-10-10 DIAGNOSIS — K219 Gastro-esophageal reflux disease without esophagitis: Secondary | ICD-10-CM | POA: Diagnosis not present

## 2022-10-10 DIAGNOSIS — R739 Hyperglycemia, unspecified: Secondary | ICD-10-CM | POA: Diagnosis not present

## 2022-10-10 DIAGNOSIS — N138 Other obstructive and reflux uropathy: Secondary | ICD-10-CM

## 2022-10-10 DIAGNOSIS — N401 Enlarged prostate with lower urinary tract symptoms: Secondary | ICD-10-CM

## 2022-10-10 DIAGNOSIS — I25810 Atherosclerosis of coronary artery bypass graft(s) without angina pectoris: Secondary | ICD-10-CM

## 2022-10-10 DIAGNOSIS — E039 Hypothyroidism, unspecified: Secondary | ICD-10-CM

## 2022-10-10 DIAGNOSIS — M150 Primary generalized (osteo)arthritis: Secondary | ICD-10-CM

## 2022-10-10 LAB — CBC WITH DIFFERENTIAL/PLATELET
Basophils Absolute: 0.1 10*3/uL (ref 0.0–0.1)
Basophils Relative: 0.7 % (ref 0.0–3.0)
Eosinophils Absolute: 0.2 10*3/uL (ref 0.0–0.7)
Eosinophils Relative: 2.3 % (ref 0.0–5.0)
HCT: 39.7 % (ref 39.0–52.0)
Hemoglobin: 13.2 g/dL (ref 13.0–17.0)
Lymphocytes Relative: 17.2 % (ref 12.0–46.0)
Lymphs Abs: 1.6 10*3/uL (ref 0.7–4.0)
MCHC: 33.2 g/dL (ref 30.0–36.0)
MCV: 92.5 fl (ref 78.0–100.0)
Monocytes Absolute: 1.2 10*3/uL — ABNORMAL HIGH (ref 0.1–1.0)
Monocytes Relative: 12.4 % — ABNORMAL HIGH (ref 3.0–12.0)
Neutro Abs: 6.4 10*3/uL (ref 1.4–7.7)
Neutrophils Relative %: 67.4 % (ref 43.0–77.0)
Platelets: 228 10*3/uL (ref 150.0–400.0)
RBC: 4.29 Mil/uL (ref 4.22–5.81)
RDW: 16.5 % — ABNORMAL HIGH (ref 11.5–15.5)
WBC: 9.5 10*3/uL (ref 4.0–10.5)

## 2022-10-10 LAB — BASIC METABOLIC PANEL
BUN: 36 mg/dL — ABNORMAL HIGH (ref 6–23)
CO2: 27 mEq/L (ref 19–32)
Calcium: 8.4 mg/dL (ref 8.4–10.5)
Chloride: 98 mEq/L (ref 96–112)
Creatinine, Ser: 1.65 mg/dL — ABNORMAL HIGH (ref 0.40–1.50)
GFR: 37.42 mL/min — ABNORMAL LOW (ref >60.00)
Glucose, Bld: 96 mg/dL (ref 70–99)
Potassium: 4.6 mEq/L (ref 3.5–5.1)
Sodium: 136 mEq/L (ref 135–145)

## 2022-10-10 LAB — LIPID PANEL
Cholesterol: 141 mg/dL (ref 0–200)
HDL: 39.7 mg/dL (ref >39.00)
LDL Cholesterol: 72 mg/dL (ref 0–99)
NonHDL: 101.34
Total CHOL/HDL Ratio: 4
Triglycerides: 148 mg/dL (ref 0.0–149.0)
VLDL: 29.6 mg/dL (ref 0.0–40.0)

## 2022-10-10 LAB — HEPATIC FUNCTION PANEL
ALT: 16 U/L (ref 0–53)
AST: 16 U/L (ref 0–37)
Albumin: 3.3 g/dL — ABNORMAL LOW (ref 3.5–5.2)
Alkaline Phosphatase: 73 U/L (ref 39–117)
Bilirubin, Direct: 0.2 mg/dL (ref 0.0–0.3)
Total Bilirubin: 0.7 mg/dL (ref 0.2–1.2)
Total Protein: 6.1 g/dL (ref 6.0–8.3)

## 2022-10-10 LAB — TSH: TSH: 26.49 u[IU]/mL — ABNORMAL HIGH (ref 0.35–5.50)

## 2022-10-10 LAB — HEMOGLOBIN A1C: Hgb A1c MFr Bld: 6.4 % (ref 4.6–6.5)

## 2022-10-10 LAB — PSA: PSA: 4.07 ng/mL — ABNORMAL HIGH (ref 0.10–4.00)

## 2022-10-10 LAB — T3, FREE: T3, Free: 2.1 pg/mL — ABNORMAL LOW (ref 2.3–4.2)

## 2022-10-10 LAB — T4, FREE: Free T4: 0.93 ng/dL (ref 0.60–1.60)

## 2022-10-10 LAB — URIC ACID: Uric Acid, Serum: 7.6 mg/dL (ref 4.0–7.8)

## 2022-10-10 MED ORDER — ENTRESTO 97-103 MG PO TABS: 1.0000 | ORAL_TABLET | Freq: Two times a day (BID) | ORAL | 3 refills | Status: DC

## 2022-10-10 NOTE — Telephone Encounter (Signed)
**Note De-Identified Savaughn Karwowski Obfuscation** I called [employer] and s/w Joscelyn who checked the pts application that we faxed to them on 09/24/22 and there was no printed Entresto 97-103 mg RX for #180 with 3 refills attached.  Per Jocelyn a printed RX with the MDs signature is required.  I checked the phone note from 09/24/22 and Dr Camillia Herter nurse noted that she faxed the application to [employer] but no mention of the signed Rx for Entresto 97-103 mg for #180 with 3 refills.  Forwarding this message to her with request that she print a Entresto 97-103 mg for #180 with 3 refills.

## 2022-10-10 NOTE — Progress Notes (Signed)
Subjective:    Patient ID: Justin [name], male    DOB: 1934/08/04, 89 y.o.   MRN: [id number]  HPI Here to follow up on issues. In general he is doing well. He had a fall about 4 weeks ago, and he saw Dr. Laurance Flatten, his orthorpedist, for this. He has also been seeing his physical therapist for dry needling, and this has been very successful at relieving his right sided low back pain. He sees Dr. [name] regularly for the atrial fibrillation, CAD, and CHF. His BP has been stable, aeveraging 110/60 at home. His GERD and asthma are stable. His gout attacks are quite infrequent. He and his wife will soon be moving into an independent living apartment at [facility] in the next few weeks.    Review of Systems  Constitutional: Negative.   HENT: Negative.    Eyes: Negative.   Respiratory: Negative.    Cardiovascular: Negative.   Gastrointestinal: Negative.   Genitourinary: Negative.   Musculoskeletal:  Positive for arthralgias and back pain.  Skin: Negative.   Neurological: Negative.   Psychiatric/Behavioral: Negative.         Objective:   Physical Exam Constitutional:      General: He is not in acute distress.    Appearance: He is well-developed. He is obese. He is not diaphoretic.  HENT:     Head: Normocephalic and atraumatic.     Right Ear: External ear normal.     Left Ear: External ear normal.     Nose: Nose normal.     Mouth/Throat:     Pharynx: No oropharyngeal exudate.  Eyes:     General: No scleral icterus.       Right eye: No discharge.        Left eye: No discharge.     Conjunctiva/sclera: Conjunctivae normal.     Pupils: Pupils are equal, round, and reactive to light.  Neck:     Thyroid: No thyromegaly.     Vascular: No JVD.     Trachea: No tracheal deviation.  Cardiovascular:     Rate and Rhythm: Normal rate and regular rhythm.     Pulses: Normal pulses.     Heart sounds: Normal heart sounds. No murmur heard.    No friction rub. No gallop.  Pulmonary:     Effort:  Pulmonary effort is normal. No respiratory distress.     Breath sounds: Normal breath sounds. No wheezing or rales.  Chest:     Chest wall: No tenderness.  Abdominal:     General: Abdomen is flat. Bowel sounds are normal. There is no distension.     Palpations: Abdomen is soft. There is no mass.     Tenderness: There is no abdominal tenderness. There is no guarding or rebound.  Genitourinary:    Penis: No tenderness.   Musculoskeletal:        General: No tenderness. Normal range of motion.     Cervical back: Neck supple.     Right lower leg: No edema.     Left lower leg: No edema.  Lymphadenopathy:     Cervical: No cervical adenopathy.  Skin:    General: Skin is warm and dry.     Coloration: Skin is not pale.     Findings: No erythema or rash.  Neurological:     Mental Status: He is alert and oriented to person, place, and time.     Cranial Nerves: No cranial nerve deficit.     Motor: No abnormal  muscle tone.     Coordination: Coordination normal.     Deep Tendon Reflexes: Reflexes are normal and symmetric. Reflexes normal.  Psychiatric:        Behavior: Behavior normal.        Thought Content: Thought content normal.        Judgment: Judgment normal.           Assessment & Plan:  His CAD and atrial fibrillation and HTN and CHF are all stable. His asthma and GERD are stable. His OA and gout are stable. Get fasting labs to check lipids, etc.  We spent a total of ( 33  ) minutes reviewing records and discussing these issues.  [name], MD

## 2022-10-10 NOTE — Telephone Encounter (Signed)
Entresto prescription faxed to [employer] at [phone].

## 2022-10-11 ENCOUNTER — Other Ambulatory Visit: Payer: Self-pay

## 2022-10-11 MED ORDER — LEVOTHYROXINE SODIUM 150 MCG PO TABS: 150.0000 ug | ORAL_TABLET | Freq: Every day | ORAL | 3 refills | Status: DC

## 2022-10-15 ENCOUNTER — Telehealth: Payer: Self-pay | Admitting: Cardiovascular Disease

## 2022-10-15 DIAGNOSIS — M545 Low back pain, unspecified: Secondary | ICD-10-CM | POA: Diagnosis not present

## 2022-10-15 NOTE — Telephone Encounter (Signed)
Transmission received.  Pt in Afib/Biv paced.  History of afib/flutter.    His effective Biv pacing is down to 74%.  Would recommend follow up with physician/APP to assess.

## 2022-10-15 NOTE — Telephone Encounter (Signed)
Spoke with pt who complains of SOB on exertion x 1 week.  Pt denies current CP, edema or dizziness/lightheadedness.  He states last BP was at his PCP office on 10/10/2022 with reading of 84/42.  Pt states he feels well otherwise.  He is taking medications as prescribed.  Requested pt check his BP at home today and send a device transmission for review.  Pt verbalizes understanding and agrees with current plan.  Reviewed ED precautions.

## 2022-10-15 NOTE — Telephone Encounter (Signed)
Pt c/o Shortness Of Breath: STAT if SOB developed within the last 24 hours or pt is noticeably SOB on the phone  1. Are you currently SOB (can you hear that pt is SOB on the phone)? No   2. How long have you been experiencing SOB? About a week   3. Are you SOB when sitting or when up moving around? Moving around   4. Are you currently experiencing any other symptoms? No    Wanted to inform Dr. [name] of this and see if a sooner appt is needed. Patient is currently scheduled for 03/20.

## 2022-10-15 NOTE — Telephone Encounter (Signed)
Spoke with pt and advised appointment scheduled with Oda Kilts, PA-C 10/16/2022 at 1220pm for further evaluation.  Pt verbalizes understanding and agrees with current plan.

## 2022-10-15 NOTE — Telephone Encounter (Signed)
**Note De-Identified Justin [name] Obfuscation** Letter received Justin [name] fax from [employer] Justin [name] fax stating that they have approved the pt for Entresto assistance until 08/26/2023. Pt ID: [id number]. The letter states that they have notified the pt of this approval as well.

## 2022-10-16 ENCOUNTER — Encounter: Payer: Self-pay | Admitting: Student

## 2022-10-16 ENCOUNTER — Ambulatory Visit: Payer: Medicare Other | Attending: Student | Admitting: Student

## 2022-10-16 VITALS — BP 77/52 | HR 76 | Ht 66.0 in | Wt 281.0 lb

## 2022-10-16 DIAGNOSIS — Z95810 Presence of automatic (implantable) cardiac defibrillator: Secondary | ICD-10-CM

## 2022-10-16 DIAGNOSIS — I2510 Atherosclerotic heart disease of native coronary artery without angina pectoris: Secondary | ICD-10-CM

## 2022-10-16 DIAGNOSIS — I4819 Other persistent atrial fibrillation: Secondary | ICD-10-CM

## 2022-10-16 DIAGNOSIS — Z952 Presence of prosthetic heart valve: Secondary | ICD-10-CM | POA: Diagnosis not present

## 2022-10-16 DIAGNOSIS — I5042 Chronic combined systolic (congestive) and diastolic (congestive) heart failure: Secondary | ICD-10-CM

## 2022-10-16 LAB — CUP PACEART INCLINIC DEVICE CHECK
Date Time Interrogation Session: 20240221125111
Implantable Lead Connection Status: 753985
Implantable Lead Connection Status: 753985
Implantable Lead Connection Status: 753985
Implantable Lead Implant Date: 20180907
Implantable Lead Implant Date: 20180907
Implantable Lead Implant Date: 20180907
Implantable Lead Location: 753858
Implantable Lead Location: 753859
Implantable Lead Location: 753860
Implantable Lead Model: 4396
Implantable Lead Model: 5076
Implantable Pulse Generator Implant Date: 20180907

## 2022-10-16 MED ORDER — ENTRESTO 49-51 MG PO TABS: 1.0000 | ORAL_TABLET | Freq: Two times a day (BID) | ORAL | 3 refills | Status: DC

## 2022-10-16 NOTE — Patient Instructions (Addendum)
Medication Instructions:  Your physician has recommended you make the following change in your medication:   STOP Entresto 97/103 TODAY, DO NOT TAKE ANYMORE TODAY OR TOMORROW/  START Entresto 49/51 ON FRIDAY 10/18/22, taking 1 tablet twice a day  WE WILL DISCUSS OTHER MEDICATION CHANGES ONCE WE GET YOUR LAB WORK BACK.   *If you need a refill on your cardiac medications before your next appointment, please call your pharmacy*   Lab Work: TODAY:  BMET & PRO BNP  If you have labs (blood work) drawn today and your tests are completely normal, you will receive your results only by: [place] (if you have MyChart) OR A paper copy in the mail If you have any lab test that is abnormal or we need to change your treatment, we will call you to review the results.   Testing/Procedures: Your physician has requested that you have an echocardiogram BEFORE 11/13/22. Echocardiography is a painless test that uses sound waves to create images of your heart. It provides your doctor with information about the size and shape of your heart and how well your heart's chambers and valves are working. This procedure takes approximately one hour. There are no restrictions for this procedure. Please do NOT wear cologne, perfume, aftershave, or lotions (deodorant is allowed). Please arrive 15 minutes prior to your appointment time.    Follow-Up: At [facility], you and your health needs are our priority.  As part of our continuing mission to provide you with exceptional heart care, we have created designated Provider Care Teams.  These Care Teams include your primary Cardiologist (physician) and Advanced Practice Providers (APPs -  Physician Assistants and Nurse Practitioners) who all work together to provide you with the care you need, when you need it.  We recommend signing up for the patient portal called "MyChart".  Sign up information is provided on this After Visit Summary.  MyChart is used to  connect with patients for Virtual Visits (Telemedicine).  Patients are able to view lab/test results, encounter notes, upcoming appointments, etc.  Non-urgent messages can be sent to your provider as well.   To learn more about what you can do with MyChart, go to [email or web address].    Your next appointment:   As scheduled    Provider:   Virl Axe, MD    Other Instructions

## 2022-10-16 NOTE — Progress Notes (Unsigned)
Electrophysiology Office Note Date: 10/16/2022  ID:  Justin [name], Justin [name] 1934-07-31, MRN [id number]  PCP: Laurey Morale, MD Primary Cardiologist: [name], MD Electrophysiologist: Virl Axe, MD   CC: Routine ICD follow-up  Justin [name] is a 89 y.o. male seen today for Virl Axe, MD for acute visit due to worsening SOB x 1 week.    Patient reports SOB with most activities, certainly with anything more than ADLs, but at times with bathing and changing clothes. + orthopnea. Has been taking medications as directed. Takes Metolazone Wed and Saturday, and notices only slightly more UOP on those days. Denies chest pain or syncope.  Feels like he holds his fluid in his abdomen.   He has not had ICD shocks.   Device History: Medtronic BiV ICD implanted 04/2017 for syncope and new LBBB following TAVR History of appropriate therapy: No History of AAD therapy: No  Past Medical History:  Diagnosis Date   Age-related macular degeneration, dry, both eyes    Allergic    "24/7; 365 days/year; I'm allergic to pollens, dust, all southern grasses/trees, mold, mildue, cats, dogs" (01/07/2017)   Anal fissure    Asthma    sees [name]    Benign prostatic hypertrophy    (sees Dr. Denman George   CAD (coronary artery disease)    a. s/p CABG 2007. b. Cath 08/2016 - 4/4 patent grafts.   Carotid bruit    carotid u/s 10/10: 0.39% bilaterally   Chronic combined systolic and diastolic CHF (congestive heart failure) (HCC)    Complication of anesthesia 1980s   "w/anal cyst OR, he gave me a saddle block then put a narcotic in spinal cord; had a severe reaction to that" (01/07/2017)   Congestive heart failure (CHF) [facility])    ED (erectile dysfunction)    Family history of adverse reaction to anesthesia    "daughter wakes up during OR" (01/07/2017)   GERD (gastroesophageal reflux disease)    Gout    HTN (hypertension)    Hx of colonic polyps    (sees Dr. Henrene Pastor)   Hyperlipidemia     Hypothyroidism    Moderate to severe aortic stenosis    a. s/p TAVR 02/2017.   Myocardial infarction [facility]) ~ 2000   Obesity    Osteoarthritis    "was in my knees, hands" (01/07/2017 )   PAF (paroxysmal atrial fibrillation) ([place])    a. documented post TAVR.   Precancerous skin lesion    (sees Dr. 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   29 mm Edwards Sapien 3 transcatheter heart valve placed via percutaneous right transfemoral approach    Current Outpatient Medications  Medication Instructions   allopurinol (ZYLOPRIM) 100 mg, Oral, Daily   aspirin 81 mg, Oral, Daily   atorvastatin (LIPITOR) 20 MG tablet TAKE ONE TABLET ONCE DAILY   cetirizine (ZYRTEC) 10 mg, Oral, 2 times daily, [facility]    colchicine 0.6 mg, Oral, Every 6 hours PRN   dabigatran (PRADAXA) 150 MG CAPS capsule TAKE 1 CAPSULE BY MOUTH TWICE  DAILY   diphenhydrAMINE (BENADRYL) 25 mg, Oral, Daily at bedtime   EPINEPHrine (EPI-PEN) 0.3 mg, Intramuscular, As needed   fluorouracil (EFUDEX) 5 % cream 1 application , Topical, 2 times daily PRN   furosemide (LASIX) 80 mg, Oral, Daily   isosorbide mononitrate (IMDUR) 30 MG 24 hr tablet TAKE ONE TABLET  ONCE DAILY   ketotifen (ZADITOR) 0.025 % ophthalmic solution 1 drop, Ophthalmic, Daily PRN   levothyroxine (SYNTHROID) 150 mcg, Oral, Daily   methocarbamol (ROBAXIN) 500 mg, Oral, 4 times daily   metoCLOPramide (REGLAN) 10 MG tablet TAKE ONE TABLET BY MOUTH EVERY MORNING WITH BREAKFAST   metolazone (ZAROXOLYN) 2.5 mg, Oral, 2 times weekly, Wednesday and Saturday   metoprolol succinate (TOPROL-XL) 50 mg, Oral, Daily, Take with or immediately following a meal.   montelukast (SINGULAIR) 10 MG tablet TAKE ONE TABLET BY MOUTH ONCE DAILY IN THE EVENING   Multiple Vitamins-Minerals (PRESERVISION AREDS PO) 2 tablets, Oral, BH-each morning   omeprazole (PRILOSEC) 20 MG capsule TAKE ONE  CAPSULE TWICE DAILY   potassium chloride SA (KLOR-CON M20) 20 MEQ tablet 20 mEq, Oral, Daily, Take 1 tablet (20 mEq total) by mouth daily.   sacubitril-valsartan (ENTRESTO) 97-103 MG 1 tablet, Oral, 2 times daily   spironolactone (ALDACTONE) 25 MG tablet TAKE ONE TABLET BY MOUTH EVERY DAY.   triamcinolone cream (KENALOG) 0.1 % 1 application , Daily PRN   valACYclovir (VALTREX) 500 mg, Oral, 2 times daily PRN    Family History: Family History  Problem Relation Age of Onset   Heart attack Father 28   Allergic rhinitis Father    Asthma Father    Heart failure Mother 57   Uterine cancer Mother    Breast cancer Mother    Colon cancer Neg Hx    Esophageal cancer Neg Hx     Physical Exam: Vitals:   10/16/22 1220  BP: (!) 77/52  Pulse: 76  SpO2: 98%  Weight: 281 lb (127.5 kg)  Height: 5' 6"$  (1.676 m)     GEN- NAD. A&O x 3. Normal affect. HEENT: Normocephalic, atraumatic Lungs- CTAB, Normal effort.  Heart- Regular rate and rhythm, Irregularly irregular rate and rhythm rate and rhythm. No M/G/R.  Extremities- Trace peripheral edema. no clubbing or cyanosis Skin- warm and dry, no rash or lesion; ICD pocket well healed  ICD interrogation- reviewed in detail today,  See PACEART report  EKG is ordered today. Personal review shows AF V paced at 76 bpm  Other studies Reviewed: Additional studies/ records that were reviewed today include: Previous EP office notes.    Assessment and Plan:  1.  Chronic systolic dysfunction s/p Medtronic CRT-D  Stable on an appropriate medical regimen Volume status difficult to assess due to body habitus NYHA III-IIIb symptoms Continue lasix 80 mg daily for tomorrow am -> Pending labwork, will transition to torsemide.  Continue to take Metolazone 2.5 mg Wed/Saturday His BP is again in the [id number] systolic today. Will decrease Entresto 49/51, holding until Friday am.  Continue Spiro 25 mg daily Normal ICD function See Claudia Desanctis Art report No changes  today  2. Persistent atrial fibrillation 3. Persistent atrial flutter/tachycardia He had decreasing periods of NSR so amiodarone was stopped in December.  His LV pacing % have been consistently low  Update echo. EF 40-45% 09/11/2021. If has dropped, may need to consider AV nodal ablation.  LV pacing % -> 61.2% -> 52.1% -> 77.4% -> 74% -> 91.9% (Sinus) -> 77.4% Today his BiV pacing over the most recent period (November) is 89%, though has had dips due to his arrhyhtmia.   4. H/o PVCs May also be contributing now without amiodarone on board.    5. Obesity Body mass index is 45.35 kg/m.   6. Hypotension 84 over palp on recheck. Holding and reducing Entresto as above.   Current medicines  are reviewed at length with the patient today.    Labs/ tests ordered today include:  Orders Placed This Encounter  Procedures   Basic metabolic panel   Pro b natriuretic peptide (BNP)   CUP PACEART INCLINIC DEVICE CHECK   EKG 12-Lead   ECHOCARDIOGRAM COMPLETE    Disposition:   Follow up with Dr. [name] as scheduled   Signed, Shirley Friar, PA-C  10/16/2022 12:32 PM  [facility] [address] [place] [address] [phone] (office) [phone] (fax)

## 2022-10-17 ENCOUNTER — Telehealth: Payer: Self-pay | Admitting: *Deleted

## 2022-10-17 DIAGNOSIS — Z79899 Other long term (current) drug therapy: Secondary | ICD-10-CM

## 2022-10-17 MED ORDER — TORSEMIDE 20 MG PO TABS: ORAL_TABLET | ORAL | 1 refills | Status: DC

## 2022-10-17 NOTE — Telephone Encounter (Signed)
-----   Message from Shirley Friar, PA-C sent at 10/17/2022  8:23 AM EST ----- BNP pending, Cr near baseline but K quite high.   No change to plan with Entresto, start new, lower dose tomorrow, 2/23.  Stop lasix.  STOP potassium.  Changing lasix to torsemide. Take 60 mg daily x 2 days, then 40 mg daily for new chronic dose.   Needs repeat BMET Mon/Tuesday of next week with high potassium and change in diuretic regimen.

## 2022-10-18 ENCOUNTER — Other Ambulatory Visit: Payer: Self-pay | Admitting: Cardiovascular Disease

## 2022-10-18 ENCOUNTER — Other Ambulatory Visit: Payer: Self-pay | Admitting: Family Medicine

## 2022-10-18 DIAGNOSIS — M109 Gout, unspecified: Secondary | ICD-10-CM

## 2022-10-18 LAB — BASIC METABOLIC PANEL
BUN/Creatinine Ratio: 20 (ref 10–24)
BUN: 31 mg/dL — ABNORMAL HIGH (ref 8–27)
CO2: 21 mmol/L (ref 20–29)
Calcium: 8.6 mg/dL (ref 8.6–10.2)
Chloride: 97 mmol/L (ref 96–106)
Creatinine, Ser: 1.54 mg/dL — ABNORMAL HIGH (ref 0.76–1.27)
Glucose: 90 mg/dL (ref 70–99)
Potassium: 5.6 mmol/L — ABNORMAL HIGH (ref 3.5–5.2)
Sodium: 134 mmol/L (ref 134–144)
eGFR: 44 mL/min/{1.73_m2} — ABNORMAL LOW (ref >59)

## 2022-10-18 LAB — PRO B NATRIURETIC PEPTIDE: NT-Pro BNP: 2293 pg/mL — ABNORMAL HIGH (ref 0–486)

## 2022-10-21 DIAGNOSIS — H353221 Exudative age-related macular degeneration, left eye, with active choroidal neovascularization: Secondary | ICD-10-CM | POA: Diagnosis not present

## 2022-10-22 ENCOUNTER — Ambulatory Visit: Payer: Medicare Other | Attending: Student

## 2022-10-22 DIAGNOSIS — Z79899 Other long term (current) drug therapy: Secondary | ICD-10-CM | POA: Diagnosis not present

## 2022-10-23 ENCOUNTER — Telehealth: Payer: Self-pay

## 2022-10-23 LAB — BASIC METABOLIC PANEL
BUN/Creatinine Ratio: 27 — ABNORMAL HIGH (ref 10–24)
BUN: 65 mg/dL — ABNORMAL HIGH (ref 8–27)
CO2: 23 mmol/L (ref 20–29)
Calcium: 8.3 mg/dL — ABNORMAL LOW (ref 8.6–10.2)
Chloride: 93 mmol/L — ABNORMAL LOW (ref 96–106)
Creatinine, Ser: 2.37 mg/dL — ABNORMAL HIGH (ref 0.76–1.27)
Glucose: 114 mg/dL — ABNORMAL HIGH (ref 70–99)
Potassium: 4.2 mmol/L (ref 3.5–5.2)
Sodium: 135 mmol/L (ref 134–144)
eGFR: 26 mL/min/{1.73_m2} — ABNORMAL LOW (ref >59)

## 2022-10-23 NOTE — Telephone Encounter (Signed)
Spoke with pt and advised of results as below.  Pt verbalized understanding.  Advised RN will also post on MyChart for review.  Pt states he is doing fine without new complaints.  Pt verbalizes understanding and agrees with current  recommendations.   Appointment scheduled for 10/31/2022 with Oda Kilts, PA-C.

## 2022-10-23 NOTE — Telephone Encounter (Signed)
-----   Message from Shirley Friar, PA-C sent at 10/23/2022 12:18 PM EST ----- Significant AKI noted with transition to torsemide.   How is his UOP?  How is his breathing?   Needs to hold Entresto and spironolactone x 2 days then resume at current doses.  Needs to hold torsemide x 2 days then resume as 40 mg every other day.   Needs to see me in office next week.

## 2022-10-25 ENCOUNTER — Other Ambulatory Visit: Payer: Self-pay

## 2022-10-25 ENCOUNTER — Telehealth: Payer: Self-pay

## 2022-10-25 DIAGNOSIS — I5042 Chronic combined systolic (congestive) and diastolic (congestive) heart failure: Secondary | ICD-10-CM

## 2022-10-25 MED ORDER — ENTRESTO 49-51 MG PO TABS: 1.0000 | ORAL_TABLET | Freq: Two times a day (BID) | ORAL | 3 refills | Status: DC

## 2022-10-25 NOTE — Telephone Encounter (Addendum)
**Note De-Identified Justin [name] Obfuscation** A letter from [facility] to the pt was left at the office with a hand written note at the bottom of the page stating that [facility] needs a new Entresto RX as his dose was decreased to 49-'51mg'$  on 10/16/22.  I called [facility] and was advised that I could call this RX in to the pharmacy and was transferred to [facility]. While on hold I did e-scribe the pts Entresto 49-51 mg #180 with 3 refills to them with a note to the pharmacist "Dose decrease. Pt ID: [id number]".  Dorita Sciara, a pharmacist at [facility] advised me that a person by the name of Justin [name] called them on Wednesday 2/28 and cancelled the pts Entresto 97-103 mg RX and stated that she was e-scribing them his new dose but they never received it.  He thanked me for calling them back with this information as this would have canceled the pts Entresto approval with them if they had not gotten a new Entresto RX from [place] soon.

## 2022-10-28 ENCOUNTER — Ambulatory Visit: Payer: Medicare Other | Attending: Internal Medicine

## 2022-10-28 DIAGNOSIS — I5042 Chronic combined systolic (congestive) and diastolic (congestive) heart failure: Secondary | ICD-10-CM

## 2022-10-28 DIAGNOSIS — Z95810 Presence of automatic (implantable) cardiac defibrillator: Secondary | ICD-10-CM

## 2022-10-30 ENCOUNTER — Other Ambulatory Visit: Payer: Self-pay | Admitting: Pulmonary Disease

## 2022-10-30 ENCOUNTER — Other Ambulatory Visit: Payer: Self-pay | Admitting: Family Medicine

## 2022-10-30 ENCOUNTER — Telehealth: Payer: Self-pay

## 2022-10-30 DIAGNOSIS — J300 Vasomotor rhinitis: Secondary | ICD-10-CM

## 2022-10-30 DIAGNOSIS — R053 Chronic cough: Secondary | ICD-10-CM

## 2022-10-30 NOTE — Progress Notes (Addendum)
EPIC Encounter for ICM Monitoring  Patient Name: ROWELL FOSNIGHT is a 89 y.o. male Date: 10/30/2022 Primary Care Physican: Nelwyn Salisbury, MD rimary Cardiologist: Clifton James Electrophysiologist: Joycelyn Schmid Pacing: 68.7% Effective 62.1%  01/08/2022 Weight: 260 lbs 06/17/2022 Weight: 272 lbs (baseline 269 lbs) 08/13/2022 Weight: 272 lbs 09/27/2022 Weight: 270 lbs  Battery Longevity: 4 months   Since 16-Oct-2022 Monitored AT/AF 211 Time in AT/AF 23.9 hr/day (99.7%) Longest AT/AF 23 hours   Attempted call to patient and unable to reach.  Left detailed message per DPR regarding transmission. Transmission reviewed.    Optivol thoracic impedance normal but was suggesting intermittent days with possible dryness.  Decrease in BiV pacing.  Device clinic does not follow up on ATP treated episodes.   Prescribed: Torsemide 20 mg take 2 tablets (40 mg total) every day.   Metolazone 2.5 mg Take 1 tablet by mouth twice a week Wednesday and Saturday (increased on 3/29). Spironolactone 25 mg take 1 tablet daily   Labs: 10/22/2022 Creatinine 2.37, BUN 65, Potassium 4.2, Sodium 135  10/16/2022 Creatinine 1.54, BUN 31, Potassium 5.6, Sodium 134  10/10/2022 Creatinine 1.65, BUN 36, Potassium 4.6, Sodium 136, GFR 37.42  04/01/2022 Creatinine 1.58, BUN 36, Potassium 4.5, Sodium 135, GFR 43 02/19/2022 Creatinine 1.43, BUN 33, Potassium 4.9, Sodium 134, GFR 48  A complete set of results can be found in Results Review.   Recommendations: Left voice mail with ICM number and encouraged to call if experiencing any fluid symptoms.   Follow-up plan: ICM clinic phone appointment on 12/02/2022.   91 day device clinic remote transmission 01/09/2023.     EP/Cardiology Office Visits: 10/31/2022 with Otilio Saber, PA.   01/13/2023 with Dr Clifton James.  11/13/2022 with Dr Graciela Husbands.   Copy of ICM check sent to Dr. Graciela Husbands.   3 month ICM trend: 10/28/2022.    12-14 Month ICM trend:     Karie Soda, RN 10/30/2022 4:44  PM

## 2022-10-30 NOTE — Telephone Encounter (Signed)
Remote ICM transmission received.  Attempted call to patient regarding ICM remote transmission and left detailed message per DPR.  Advised to return call for any fluid symptoms or questions. Next ICM remote transmission scheduled 12/02/2022.

## 2022-10-31 ENCOUNTER — Ambulatory Visit: Payer: Medicare Other | Attending: Student | Admitting: Student

## 2022-10-31 ENCOUNTER — Encounter: Payer: Self-pay | Admitting: Student

## 2022-10-31 VITALS — BP 86/0 | HR 97 | Ht 66.0 in | Wt 278.8 lb

## 2022-10-31 DIAGNOSIS — N179 Acute kidney failure, unspecified: Secondary | ICD-10-CM

## 2022-10-31 DIAGNOSIS — Z95810 Presence of automatic (implantable) cardiac defibrillator: Secondary | ICD-10-CM

## 2022-10-31 DIAGNOSIS — I5042 Chronic combined systolic (congestive) and diastolic (congestive) heart failure: Secondary | ICD-10-CM | POA: Diagnosis not present

## 2022-10-31 MED ORDER — TORSEMIDE 20 MG PO TABS: 40.0000 mg | ORAL_TABLET | ORAL | 15 refills | Status: DC

## 2022-10-31 NOTE — Patient Instructions (Signed)
Medication Instructions:   DISCONTINUE  Entresto.  DECREASE Torsemide two (2 ) tablets by mouth ( 40 mg) every other day.   *If you need a refill on your cardiac medications before your next appointment, please call your pharmacy*   Lab Work:  TODAY!!!! BMET  If you have labs (blood work) drawn today and your tests are completely normal, you will receive your results only by: [place] (if you have MyChart) OR A paper copy in the mail If you have any lab test that is abnormal or we need to change your treatment, we will call you to review the results.   Testing/Procedures:  Keep your appointment for your echo.    Follow-Up: At [facility], you and your health needs are our priority.  As part of our continuing mission to provide you with exceptional heart care, we have created designated Provider Care Teams.  These Care Teams include your primary Cardiologist (physician) and Advanced Practice Providers (APPs -  Physician Assistants and Nurse Practitioners) who all work together to provide you with the care you need, when you need it.  We recommend signing up for the patient portal called "MyChart".  Sign up information is provided on this After Visit Summary.  MyChart is used to connect with patients for Virtual Visits (Telemedicine).  Patients are able to view lab/test results, encounter notes, upcoming appointments, etc.  Non-urgent messages can be sent to your provider as well.   To learn more about what you can do with MyChart, go to [email or web address].    Your next appointment:   2 week(s)  Provider:   Virl Axe, MD

## 2022-10-31 NOTE — Progress Notes (Signed)
Spoke with patient and heart failure questions reviewed.  Transmission results reviewed.  Pt asymptomatic for fluid accumulation.  He reports med changes that were made at last OV with Oda Kilts, PA.  He has a follow up appointment this morning with Jonni Sanger.  No changes and encouraged to call if experiencing any fluid symptoms.

## 2022-10-31 NOTE — Progress Notes (Signed)
  Cardiology Office Note:   Date:  10/31/2022  ID:  Justin [name], DOB Oct 21, 1933, MRN [id number]  Primary Cardiologist: [name], MD Electrophysiologist: Virl Axe, MD   History of Present Illness:   Justin [name] is a 89 y.o. male h/o VT, SND s/p PPM, Persistent AF/AFL, s/p TAVR, obesity, PVCs, and chronic systolic CHF seen today for acute visit due to abnormal labwork after transition to torsemide  At last visit was volume overloaded and switched from torsemide to lasix. He also had hypotension requiring down-titration of his Entresto.   Patient reports feeling some what better since last appointment. Remains lightheaded with rapid position changes, and BP has remained low. His UOP has been good. Instead of torsemide 40 every other day, he has been taking 20 mg BID EVERY OTHER DAY. He feels like this has helped his fluid come down.  He gets up in the night q 2 hours to urinate, and has follow up with Urology. States his PSA was recent elevated. SOB with more than ADLs  Review of systems complete and found to be negative unless listed in HPI.   Device History: Medtronic BiV ICD implanted 04/2017 for syncope and new LBBB following TAVR History of appropriate therapy: No History of AAD therapy: No     Studies Reviewed:    ICD Interrogation-  reviewed in detail today,  See PACEART report.  EKG is not ordered today  Echo 08/2021 LVEF 40-45%  Risk Assessment/Calculations:             Physical Exam:   VS:  BP (!) 86/0 Comment: palp  Pulse 97   Ht '5\' 6"'$  (1.676 m)   Wt 278 lb 12.8 oz (126.5 kg)   SpO2 97%   BMI 45.00 kg/m    Wt Readings from Last 3 Encounters:  10/31/22 278 lb 12.8 oz (126.5 kg)  10/16/22 281 lb (127.5 kg)  10/10/22 278 lb (126.1 kg)     GEN: Well nourished, well developed in no acute distress NECK: No JVD; No carotid bruits CARDIAC: Regular rate and rhythm, no murmurs, rubs, gallops RESPIRATORY:  Clear to auscultation without rales,  wheezing or rhonchi  ABDOMEN: Soft, non-tender, non-distended EXTREMITIES:  No edema; No deformity   ASSESSMENT AND PLAN:    Chronic systolic dysfunction s/p Medtronic CRT-D  Volume status looks OK.  Normal ICD function See Pace Art report No changes today His BP remains very low today. I do not think he is tolerating Entresto. Took multiple attempts to get BP of 86/palp.  STOP Entresto. Will leave off until 2 week f/u with Dr. [name]. I am hesitant to try even on 24/26. He may need to go back on Losartan instead.  Update Echo 11/12/2022.  AKI on CKD III Cr 1.54 -> 2.37 after transition to torsemide.  Justin [name] was held and decreased.  Justin [name] was held  Persistent atrial fibrillation Persistent atrial flutter/tachycardia He is likely going to become permanent AF soon, if hasn't already.  LV pacing has chronically been low in 60-90% range depending on his control.  Continue pradaxa 150 mg BID   Disposition:   Follow up with Dr. [name] in 2 weeks as scheduled given his acute issues   Signed, Shirley Friar, PA-C

## 2022-11-01 LAB — BASIC METABOLIC PANEL
BUN/Creatinine Ratio: 24 (ref 10–24)
BUN: 44 mg/dL — ABNORMAL HIGH (ref 8–27)
CO2: 21 mmol/L (ref 20–29)
Calcium: 8.3 mg/dL — ABNORMAL LOW (ref 8.6–10.2)
Chloride: 94 mmol/L — ABNORMAL LOW (ref 96–106)
Creatinine, Ser: 1.81 mg/dL — ABNORMAL HIGH (ref 0.76–1.27)
Glucose: 128 mg/dL — ABNORMAL HIGH (ref 70–99)
Potassium: 4.3 mmol/L (ref 3.5–5.2)
Sodium: 132 mmol/L — ABNORMAL LOW (ref 134–144)
eGFR: 36 mL/min/{1.73_m2} — ABNORMAL LOW (ref >59)

## 2022-11-04 ENCOUNTER — Other Ambulatory Visit: Payer: Self-pay

## 2022-11-04 ENCOUNTER — Telehealth: Payer: Self-pay | Admitting: Family Medicine

## 2022-11-04 MED ORDER — BENZONATATE 200 MG PO CAPS: 200.0000 mg | ORAL_CAPSULE | Freq: Two times a day (BID) | ORAL | 0 refills | Status: DC | PRN

## 2022-11-04 NOTE — Telephone Encounter (Signed)
Pt requested for refill on Benzonatate for cough, refill sent to pt pharmacy

## 2022-11-04 NOTE — Telephone Encounter (Signed)
Pt call and stated he want you to give him a call back about some medication .

## 2022-11-06 ENCOUNTER — Ambulatory Visit (INDEPENDENT_AMBULATORY_CARE_PROVIDER_SITE_OTHER): Payer: Medicare Other

## 2022-11-06 DIAGNOSIS — I428 Other cardiomyopathies: Secondary | ICD-10-CM

## 2022-11-11 ENCOUNTER — Other Ambulatory Visit: Payer: Self-pay | Admitting: Family Medicine

## 2022-11-11 DIAGNOSIS — K219 Gastro-esophageal reflux disease without esophagitis: Secondary | ICD-10-CM

## 2022-11-11 LAB — CUP PACEART REMOTE DEVICE CHECK
Battery Remaining Longevity: 5 mo
Battery Voltage: 2.81 V
Brady Statistic AP VP Percent: 15.16 %
Brady Statistic AP VS Percent: 2.77 %
Brady Statistic AS VP Percent: 53.24 %
Brady Statistic AS VS Percent: 28.83 %
Brady Statistic RA Percent Paced: 14.11 %
Brady Statistic RV Percent Paced: 68.66 %
Date Time Interrogation Session: 20240315164218
HighPow Impedance: 80 Ohm
Implantable Lead Connection Status: 753985
Implantable Lead Connection Status: 753985
Implantable Lead Connection Status: 753985
Implantable Lead Implant Date: 20180907
Implantable Lead Implant Date: 20180907
Implantable Lead Implant Date: 20180907
Implantable Lead Location: 753858
Implantable Lead Location: 753859
Implantable Lead Location: 753860
Implantable Lead Model: 4396
Implantable Lead Model: 5076
Implantable Pulse Generator Implant Date: 20180907
Lead Channel Impedance Value: 304 Ohm
Lead Channel Impedance Value: 342 Ohm
Lead Channel Impedance Value: 342 Ohm
Lead Channel Impedance Value: 456 Ohm
Lead Channel Impedance Value: 532 Ohm
Lead Channel Impedance Value: 855 Ohm
Lead Channel Pacing Threshold Amplitude: 0.875 V
Lead Channel Pacing Threshold Amplitude: 1 V
Lead Channel Pacing Threshold Amplitude: 1.375 V
Lead Channel Pacing Threshold Pulse Width: 0.4 ms
Lead Channel Pacing Threshold Pulse Width: 0.4 ms
Lead Channel Pacing Threshold Pulse Width: 1 ms
Lead Channel Sensing Intrinsic Amplitude: 0.875 mV
Lead Channel Sensing Intrinsic Amplitude: 0.875 mV
Lead Channel Sensing Intrinsic Amplitude: 9.25 mV
Lead Channel Sensing Intrinsic Amplitude: 9.25 mV
Lead Channel Setting Pacing Amplitude: 1.75 V
Lead Channel Setting Pacing Amplitude: 2.25 V
Lead Channel Setting Pacing Amplitude: 2.5 V
Lead Channel Setting Pacing Pulse Width: 0.4 ms
Lead Channel Setting Pacing Pulse Width: 1 ms
Lead Channel Setting Sensing Sensitivity: 0.3 mV
Zone Setting Status: 755011

## 2022-11-12 ENCOUNTER — Ambulatory Visit: Payer: Medicare Other | Attending: Student

## 2022-11-12 DIAGNOSIS — I5042 Chronic combined systolic (congestive) and diastolic (congestive) heart failure: Secondary | ICD-10-CM

## 2022-11-12 DIAGNOSIS — I495 Sick sinus syndrome: Secondary | ICD-10-CM | POA: Diagnosis not present

## 2022-11-12 DIAGNOSIS — E039 Hypothyroidism, unspecified: Secondary | ICD-10-CM | POA: Diagnosis not present

## 2022-11-12 DIAGNOSIS — Z95810 Presence of automatic (implantable) cardiac defibrillator: Secondary | ICD-10-CM

## 2022-11-12 DIAGNOSIS — I4729 Other ventricular tachycardia: Secondary | ICD-10-CM | POA: Diagnosis not present

## 2022-11-12 DIAGNOSIS — I4819 Other persistent atrial fibrillation: Secondary | ICD-10-CM | POA: Diagnosis not present

## 2022-11-12 DIAGNOSIS — Z952 Presence of prosthetic heart valve: Secondary | ICD-10-CM | POA: Diagnosis not present

## 2022-11-12 DIAGNOSIS — Z79899 Other long term (current) drug therapy: Secondary | ICD-10-CM | POA: Diagnosis not present

## 2022-11-12 DIAGNOSIS — I484 Atypical atrial flutter: Secondary | ICD-10-CM | POA: Diagnosis not present

## 2022-11-12 DIAGNOSIS — I2510 Atherosclerotic heart disease of native coronary artery without angina pectoris: Secondary | ICD-10-CM

## 2022-11-12 DIAGNOSIS — I493 Ventricular premature depolarization: Secondary | ICD-10-CM | POA: Diagnosis not present

## 2022-11-12 LAB — ECHOCARDIOGRAM COMPLETE
AR max vel: 3 cm2
AV Area VTI: 2.88 cm2
AV Area mean vel: 2.92 cm2
AV Mean grad: 5 mmHg
AV Peak grad: 9.5 mmHg
Ao pk vel: 1.55 m/s
Area-P 1/2: 3.22 cm2
S' Lateral: 2.6 cm

## 2022-11-13 ENCOUNTER — Encounter: Payer: Self-pay | Admitting: Internal Medicine

## 2022-11-13 ENCOUNTER — Ambulatory Visit: Payer: Medicare Other | Admitting: Internal Medicine

## 2022-11-13 VITALS — BP 104/68 | HR 95 | Ht 66.0 in | Wt 280.6 lb

## 2022-11-13 DIAGNOSIS — I493 Ventricular premature depolarization: Secondary | ICD-10-CM | POA: Insufficient documentation

## 2022-11-13 DIAGNOSIS — E039 Hypothyroidism, unspecified: Secondary | ICD-10-CM | POA: Insufficient documentation

## 2022-11-13 DIAGNOSIS — I4819 Other persistent atrial fibrillation: Secondary | ICD-10-CM | POA: Insufficient documentation

## 2022-11-13 DIAGNOSIS — Z79899 Other long term (current) drug therapy: Secondary | ICD-10-CM

## 2022-11-13 DIAGNOSIS — I4729 Other ventricular tachycardia: Secondary | ICD-10-CM

## 2022-11-13 DIAGNOSIS — I484 Atypical atrial flutter: Secondary | ICD-10-CM | POA: Diagnosis not present

## 2022-11-13 DIAGNOSIS — I5042 Chronic combined systolic (congestive) and diastolic (congestive) heart failure: Secondary | ICD-10-CM

## 2022-11-13 DIAGNOSIS — Z952 Presence of prosthetic heart valve: Secondary | ICD-10-CM | POA: Insufficient documentation

## 2022-11-13 DIAGNOSIS — I2510 Atherosclerotic heart disease of native coronary artery without angina pectoris: Secondary | ICD-10-CM | POA: Insufficient documentation

## 2022-11-13 DIAGNOSIS — Z95810 Presence of automatic (implantable) cardiac defibrillator: Secondary | ICD-10-CM

## 2022-11-13 DIAGNOSIS — I495 Sick sinus syndrome: Secondary | ICD-10-CM | POA: Insufficient documentation

## 2022-11-13 MED ORDER — AMIODARONE HCL 200 MG PO TABS: ORAL_TABLET | ORAL | 0 refills | Status: DC

## 2022-11-13 MED ORDER — AMIODARONE HCL 200 MG PO TABS: 200.0000 mg | ORAL_TABLET | Freq: Every day | ORAL | 3 refills | Status: DC

## 2022-11-13 NOTE — Patient Instructions (Signed)
Medication Instructions:  Your physician has recommended you make the following change in your medication:   Please take Torsemide 20mg  - 2 tablets daily x 3 days.  Then return to normal dosing.  Begin Amiodarone 200mg  - 2 tablets by mouth twice daily x 2 weeks then decrease to 2 tablets by mouth daily x 2 weeks then decrease to Amiodarone 200mg  1 tablet by mouth daily  *If you need a refill on your cardiac medications before your next appointment, please call your pharmacy*   Lab Work:  CMET and TSH on Tuesday 11/19/2022   If you have labs (blood work) drawn today and your tests are completely normal, you will receive your results only by: [place] (if you have MyChart) OR A paper copy in the mail If you have any lab test that is abnormal or we need to change your treatment, we will call you to review the results.   Testing/Procedures: None ordered.    Follow-Up: At [facility], you and your health needs are our priority.  As part of our continuing mission to provide you with exceptional heart care, we have created designated Provider Care Teams.  These Care Teams include your primary Cardiologist (physician) and Advanced Practice Providers (APPs -  Physician Assistants and Nurse Practitioners) who all work together to provide you with the care you need, when you need it.  We recommend signing up for the patient portal called "MyChart".  Sign up information is provided on this After Visit Summary.  MyChart is used to connect with patients for Virtual Visits (Telemedicine).  Patients are able to view lab/test results, encounter notes, upcoming appointments, etc.  Non-urgent messages can be sent to your provider as well.   To learn more about what you can do with MyChart, go to [email or web address].    Your next appointment:   3 months with Dr Olin Pia PA_C, Oda Kilts

## 2022-11-13 NOTE — Progress Notes (Signed)
Patient Care Team: Laurey Morale, MD as PCP - General Angelena Form [name], MD as PCP - Cardiology (Cardiology) Deboraha Sprang, MD as PCP - Electrophysiology (Cardiology) Festus Aloe, MD as Consulting Physician (Urology) Viona Gilmore, [facility] (Inactive) as Pharmacist (Pharmacist)   HPI  Justin [name] is a 89 y.o. male Seen for follow-up of a CRT ICD 9/18 implanted because of syncope  and new left bundle branch block following TAVR ; hx of Afib on dabigitran and ASA;  intercurrent Afib with DCCV 9/22   Initially significantly improved following CRT.  Now with repeated issues with exercise intolerance.  Some edema.  No chest pain.  Interval atrial fibrillation requiring repeat cardioversion 3/16 not associated with significant symptoms  Significant PVC burden which is affecting his CRT pacing percentage.  Repeat echo as noted below was surprisingly good with some interval improvement (may be)   Repeat cardioversion 3/23 and then started on amiodarone.  Seen 6/23 with persistent atrial flutter for about 2 months and then submitted for cardioversion.  When seen 2 weeks later, he was again out of rhythm.  7/23 seen in the office recurrent atrial tachycardia/flutter.  Underwent in office pace termination and ATP algorithms were activated. Amiodarone was discontinued because of failure to maintain sinus rhythm.  Over recent months has had progressive dyspnea on exertion progressive edema.  Efforts for outpatient diuresis have been complicated by bump in his creatinine as noted below. No chest pain          DATE TEST EF   1/18 LHC  Patent grafts  8/18 Echo   40-45 %   6/19 Echo   40-45 %   9/20 Echo  35-40%   1/23 Echo 40-45%   3/24 Echo  40-45%     DATE Cr K Hgb TSH LFTs  11/22  1.08 4.7 13.0(8/22)      3/23 1.3 4.3 14.6 4.72(4/23) 9(4/23)  }8/23 1.58 4.5 13.2    3/24 1.81<<2.37 4.3 13.2              Past Medical History:  Diagnosis Date    Age-related macular degeneration, dry, both eyes    Allergic    "24/7; 365 days/year; I'm allergic to pollens, dust, all southern grasses/trees, mold, mildue, cats, dogs" (01/07/2017)   Anal fissure    Asthma    sees [name]    Benign prostatic hypertrophy    (sees Dr. Denman George   CAD (coronary artery disease)    a. s/p CABG 2007. b. Cath 08/2016 - 4/4 patent grafts.   Carotid bruit    carotid u/s 10/10: 0.39% bilaterally   Chronic combined systolic and diastolic CHF (congestive heart failure) (HCC)    Complication of anesthesia 1980s   "w/anal cyst OR, he gave me a saddle block then put a narcotic in spinal cord; had a severe reaction to that" (01/07/2017)   Congestive heart failure (CHF) [facility])    ED (erectile dysfunction)    Family history of adverse reaction to anesthesia    "daughter wakes up during OR" (01/07/2017)   GERD (gastroesophageal reflux disease)    Gout    HTN (hypertension)    Hx of colonic polyps    (sees Dr. Henrene Pastor)   Hyperlipidemia    Hypothyroidism    Moderate to severe aortic stenosis    a. s/p TAVR 02/2017.   Myocardial infarction [facility]) ~ 2000   Obesity    Osteoarthritis    "was in my  knees, hands" (01/07/2017 )   PAF (paroxysmal atrial fibrillation) ([place])    a. documented post TAVR.   Precancerous skin lesion    (sees Dr. 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   29 mm Edwards Sapien 3 transcatheter heart valve placed via percutaneous right transfemoral approach    Past Surgical History:  Procedure Laterality Date   ANUS SURGERY     "opened it back up cause it wouldn't heal; wound up w/a fissure" (01/07/2017)   BIV ICD INSERTION CRT-D N/A 05/02/2017   Procedure: BIV ICD INSERTION CRT-D;  Surgeon: Deboraha Sprang, MD;  Location: [place] CV LAB;  Service: Cardiovascular;  Laterality: N/A;   CARDIAC CATHETERIZATION  10/29/2005   CARDIAC CATHETERIZATION N/A  08/27/2016   Procedure: Right/Left Heart Cath and Coronary/Graft Angiography;  Surgeon: Burnell Blanks, MD;  Location: [place] CV LAB;  Service: Cardiovascular;  Laterality: N/A;   CARDIOVERSION N/A 04/24/2021   Procedure: CARDIOVERSION;  Surgeon: Fay Records, MD;  Location: [place];  Service: Cardiovascular;  Laterality: N/A;   CARDIOVERSION N/A 11/08/2021   Procedure: CARDIOVERSION;  Surgeon: Pixie Casino, MD;  Location: [facility] ENDOSCOPY;  Service: Cardiovascular;  Laterality: N/A;   CARDIOVERSION N/A 02/06/2022   Procedure: CARDIOVERSION;  Surgeon: Josue Hector, MD;  Location: [facility] ENDOSCOPY;  Service: Cardiovascular;  Laterality: N/A;   CATARACT EXTRACTION W/ INTRAOCULAR LENS  IMPLANT, BILATERAL Bilateral    CATARACT EXTRACTION, BILATERAL  2012   COLONOSCOPY  06/30/2008   no repeats needed    COLONOSCOPY     had 3 or 4 in the past    CORONARY ARTERY BYPASS GRAFT  2007   "CABG X4"   CYST EXCISION PERINEAL  1980s   HAMMER TOE SURGERY Bilateral    JOINT REPLACEMENT     KNEE ARTHROPLASTY  07/30/2011   Procedure: COMPUTER ASSISTED TOTAL KNEE ARTHROPLASTY;  Surgeon: Meredith Pel;  Location: [place];  Service: Orthopedics;  Laterality: Left;  left total knee arthroplasty   MASTECTOMY SUBCUTANEOUS Bilateral    MULTIPLE TOOTH EXTRACTIONS     ORIF FINGER / THUMB FRACTURE Right ~ 1980   "repair of thumb injury"   PROSTATE BIOPSY     REPLACEMENT TOTAL KNEE BILATERAL Bilateral 2012   TEE WITHOUT CARDIOVERSION N/A 03/04/2017   Procedure: TRANSESOPHAGEAL ECHOCARDIOGRAM (TEE);  Surgeon: Burnell Blanks, MD;  Location: [place];  Service: Open Heart Surgery;  Laterality: N/A;   TOTAL KNEE REVISION Right 05/18/2019   Procedure: RIGHT PATELLA REVISION/REMOVAL;  Surgeon: Meredith Pel, MD;  Location: [place];  Service: Orthopedics;  Laterality: Right;   TRANSCATHETER AORTIC VALVE REPLACEMENT, TRANSFEMORAL N/A 03/04/2017   Procedure: TRANSCATHETER AORTIC VALVE REPLACEMENT,  TRANSFEMORAL;  Surgeon: Burnell Blanks, MD;  Location: [place];  Service: Open Heart Surgery;  Laterality: N/A;    Current Outpatient Medications  Medication Sig Dispense Refill   allopurinol (ZYLOPRIM) 100 MG tablet Take 1 tablet (100 mg total) by mouth daily. 90 tablet 3   aspirin 81 MG tablet Take 1 tablet (81 mg total) by mouth daily. 30 tablet 0   atorvastatin (LIPITOR) 20 MG tablet TAKE ONE TABLET ONCE DAILY 90 tablet 3   benzonatate (TESSALON) 200 MG capsule Take 1 capsule (200 mg total) by mouth 2 (two) times daily as needed for cough. 20 capsule 0   cetirizine (ZYRTEC) 10 MG tablet Take 10 mg by mouth 2 (  two) times daily. [employer]/ [facility]     colchicine 0.6 MG tablet Take 1 tablet (0.6 mg total) by mouth every 6 (six) hours as needed (gout). 60 tablet 5   dabigatran (PRADAXA) 150 MG CAPS capsule TAKE 1 CAPSULE BY MOUTH TWICE  DAILY 180 capsule 1   diphenhydrAMINE (BENADRYL) 25 MG tablet Take 25 mg by mouth at bedtime.      EPINEPHrine 0.3 mg/0.3 mL IJ SOAJ injection Inject 0.3 mLs (0.3 mg total) into the muscle as needed for anaphylaxis. 2 Device 1   fluorouracil (EFUDEX) 5 % cream Apply 1 application  topically 2 (two) times daily as needed (rash).     isosorbide mononitrate (IMDUR) 30 MG 24 hr tablet TAKE ONE TABLET ONCE DAILY 90 tablet 0   ketotifen (ZADITOR) 0.025 % ophthalmic solution Apply 1 drop to eye daily as needed (itchy eyes).      levothyroxine (SYNTHROID) 150 MCG tablet Take 1 tablet (150 mcg total) by mouth daily. 90 tablet 3   methocarbamol (ROBAXIN) 500 MG tablet Take 1 tablet (500 mg total) by mouth 4 (four) times daily. (Patient taking differently: Take 500 mg by mouth 4 (four) times daily as needed.) 40 tablet 0   metoCLOPramide (REGLAN) 10 MG tablet TAKE ONE TABLET BY MOUTH EVERY MORNING WITH BREAKFAST 30 tablet 3   metolazone (ZAROXOLYN) 2.5 MG tablet Take 1 tablet (2.5 mg total) by mouth 2 (two) times a week. Wednesday and Saturday 24 tablet 3   metoprolol  succinate (TOPROL-XL) 50 MG 24 hr tablet Take 75 mg by mouth daily. Take with or immediately following a meal.     montelukast (SINGULAIR) 10 MG tablet TAKE ONE TABLET BY MOUTH ONCE DAILY IN THE EVENING 90 tablet 3   Multiple Vitamins-Minerals (PRESERVISION AREDS PO) Take 2 tablets by mouth every morning.     omeprazole (PRILOSEC) 20 MG capsule TAKE ONE CAPSULE TWICE DAILY 180 capsule 0   spironolactone (ALDACTONE) 25 MG tablet Take 1 tablet (25 mg total) by mouth daily. 90 tablet 3   torsemide (DEMADEX) 20 MG tablet Take 2 tablets (40 mg total) by mouth every other day. 30 tablet 15   triamcinolone cream (KENALOG) 0.1 % Apply 1 application  topically daily as needed for dry skin.     valACYclovir (VALTREX) 500 MG tablet Take 500 mg by mouth 2 (two) times daily as needed (cold sores).     No current facility-administered medications for this visit.    Allergies  Allergen Reactions   Peanut-Containing Drug Products Anaphylaxis   Sulfonamide Derivatives Anaphylaxis   Amlodipine Swelling    Swelling in ankles   Eliquis [Apixaban] Other (See Comments)    Back/hip pain   Lisinopril Cough    cough   Xarelto [Rivaroxaban] Other (See Comments)    Back/hip pain     Review of Systems negative except from HPI and PMH Please see the history of present illness. (+)  All other systems are reviewed and negative.     Physical Exam  BP 104/68   Pulse 95   Ht 5\' 6"  (1.676 m)   Wt 280 lb 9.6 oz (127.3 kg)   SpO2 96%   BMI 45.29 kg/m  Well developed and well nourished in no acute distress HENT normal Neck supple   Clear Device pocket well healed; without hematoma or erythema.  There is no tethering  Irregularly irregular rate and rhythm murmur Abd-soft with active BS No Clubbing cyanosis 2+ edema Skin-warm and dry A & Oriented  Grossly normal sensory and motor function  ECG ventricular pacing and atrial fibrillation with an upright QRS lead V1 and a negative QRS lead I with frequent  interpolated PVCs.  We undertook a atrial fibrillation baseline electrocardiogram to try to distinguish between aberrant conduction and PVCs.  On the twelve-lead, there is at least 2 occasions where the RR interval that was associated with a wide beat was longer than the RR coupling interval on normally conducted beats  Device function is normal. Programming changes none  See Paceart for details     Assessment and  Plan  Ventricular tachycardia- prolonged- nonsustained  PVCs  Atrial fibrillation-permanent  Post TAVR left bundle branch block/IVCD  Cardiomyopathy EF 40-45%  CRT- D Medtronic       CHF chronic systolic/diastolic   Morbidly obese  .  Renal insufficiency Estimated Creatinine Clearance: 37 mL/min (A) (by C-G formula based on SCr of 1.81 mg/dL (H)).     Continues with PVCs, burden has been only increased from the 3% range to the double-digit percents on average would not today is about 30 or 40%.  I suspect this is contributing to his worsening heart failure symptoms as supported by Opti[employer].    There is some degree of hepatorenal syndrome.  We will try outpatient diuresis we will have him increase his torsemide from 40 every other day to 40 daily x 3 days he will take his metolazone as scheduled on Saturday.  We also discussed the importance of restricting his p.o. fluid intake  then we will recheck his metabolic profile next week, I am not sanguine that we will do this as an outpatient he may need inpatient diuresis.  Will also arrange consultation with the heart failure team.  Amiodarone will be reloaded 400 twice daily x 2 weeks 400 daily x 2 weeks, we will get baseline liver profile and thyroid studies next week we will recheck his creatinine.

## 2022-11-19 ENCOUNTER — Other Ambulatory Visit: Payer: Medicare Other

## 2022-11-20 ENCOUNTER — Ambulatory Visit: Payer: Medicare Other | Attending: Internal Medicine

## 2022-11-20 ENCOUNTER — Other Ambulatory Visit: Payer: Self-pay | Admitting: *Deleted

## 2022-11-20 DIAGNOSIS — I495 Sick sinus syndrome: Secondary | ICD-10-CM | POA: Diagnosis not present

## 2022-11-20 DIAGNOSIS — I5042 Chronic combined systolic (congestive) and diastolic (congestive) heart failure: Secondary | ICD-10-CM | POA: Diagnosis not present

## 2022-11-20 DIAGNOSIS — I484 Atypical atrial flutter: Secondary | ICD-10-CM | POA: Diagnosis not present

## 2022-11-20 DIAGNOSIS — Z79899 Other long term (current) drug therapy: Secondary | ICD-10-CM

## 2022-11-20 DIAGNOSIS — E039 Hypothyroidism, unspecified: Secondary | ICD-10-CM

## 2022-11-20 DIAGNOSIS — I4729 Other ventricular tachycardia: Secondary | ICD-10-CM | POA: Diagnosis not present

## 2022-11-20 DIAGNOSIS — I4819 Other persistent atrial fibrillation: Secondary | ICD-10-CM | POA: Diagnosis not present

## 2022-11-20 DIAGNOSIS — E875 Hyperkalemia: Secondary | ICD-10-CM

## 2022-11-20 NOTE — Progress Notes (Signed)
Remote ICD transmission.   

## 2022-11-21 ENCOUNTER — Telehealth: Payer: Self-pay

## 2022-11-21 LAB — COMPREHENSIVE METABOLIC PANEL
ALT: 18 IU/L (ref 0–44)
AST: 26 IU/L (ref 0–40)
Albumin/Globulin Ratio: 1.5 (ref 1.2–2.2)
Albumin: 3.8 g/dL (ref 3.7–4.7)
Alkaline Phosphatase: 104 IU/L (ref 44–121)
BUN/Creatinine Ratio: 20 (ref 10–24)
BUN: 28 mg/dL — ABNORMAL HIGH (ref 8–27)
Bilirubin Total: 0.7 mg/dL (ref 0.0–1.2)
CO2: 25 mmol/L (ref 20–29)
Calcium: 8.6 mg/dL (ref 8.6–10.2)
Chloride: 95 mmol/L — ABNORMAL LOW (ref 96–106)
Creatinine, Ser: 1.39 mg/dL — ABNORMAL HIGH (ref 0.76–1.27)
Globulin, Total: 2.5 g/dL (ref 1.5–4.5)
Glucose: 128 mg/dL — ABNORMAL HIGH (ref 70–99)
Potassium: 3.2 mmol/L — ABNORMAL LOW (ref 3.5–5.2)
Sodium: 139 mmol/L (ref 134–144)
Total Protein: 6.3 g/dL (ref 6.0–8.5)
eGFR: 49 mL/min/{1.73_m2} — ABNORMAL LOW (ref >59)

## 2022-11-21 LAB — TSH: TSH: 16.2 u[IU]/mL — ABNORMAL HIGH (ref 0.450–4.500)

## 2022-11-21 MED ORDER — POTASSIUM CHLORIDE CRYS ER 20 MEQ PO TBCR: 20.0000 meq | EXTENDED_RELEASE_TABLET | Freq: Two times a day (BID) | ORAL | 3 refills | Status: DC

## 2022-11-21 NOTE — Telephone Encounter (Signed)
Spoke with Dr Lovena Le, DOD re: K+ of 3.2, who advises pt to start K-Dur 21meq 1 tablet twice daily and recheck labs on Monday due to hypokalemia.  Pt is scheduled to be seen in CHF clinic on 11/25/2022.   Spoke with pt and advised of recommendation as above.  Pt verbalizes understanding and thanked Therapist, sports for the call.

## 2022-11-25 ENCOUNTER — Encounter (HOSPITAL_COMMUNITY): Payer: Self-pay | Admitting: Cardiology

## 2022-11-25 ENCOUNTER — Ambulatory Visit (HOSPITAL_COMMUNITY)
Admission: RE | Admit: 2022-11-25 | Discharge: 2022-11-25 | Disposition: A | Discharge status: Home / Self Care | Payer: Medicare Other | Source: Ambulatory Visit | Attending: Cardiology | Admitting: Cardiology

## 2022-11-25 ENCOUNTER — Other Ambulatory Visit (HOSPITAL_COMMUNITY): Payer: Self-pay

## 2022-11-25 VITALS — BP 128/80 | HR 98 | Wt 275.0 lb

## 2022-11-25 DIAGNOSIS — R0602 Shortness of breath: Secondary | ICD-10-CM | POA: Insufficient documentation

## 2022-11-25 DIAGNOSIS — E669 Obesity, unspecified: Secondary | ICD-10-CM | POA: Insufficient documentation

## 2022-11-25 DIAGNOSIS — I130 Hypertensive heart and chronic kidney disease with heart failure and stage 1 through stage 4 chronic kidney disease, or unspecified chronic kidney disease: Secondary | ICD-10-CM | POA: Insufficient documentation

## 2022-11-25 DIAGNOSIS — Z7901 Long term (current) use of anticoagulants: Secondary | ICD-10-CM | POA: Diagnosis not present

## 2022-11-25 DIAGNOSIS — I493 Ventricular premature depolarization: Secondary | ICD-10-CM | POA: Insufficient documentation

## 2022-11-25 DIAGNOSIS — M109 Gout, unspecified: Secondary | ICD-10-CM | POA: Diagnosis not present

## 2022-11-25 DIAGNOSIS — Z95810 Presence of automatic (implantable) cardiac defibrillator: Secondary | ICD-10-CM | POA: Diagnosis not present

## 2022-11-25 DIAGNOSIS — Z6841 Body Mass Index (BMI) 40.0 and over, adult: Secondary | ICD-10-CM | POA: Diagnosis not present

## 2022-11-25 DIAGNOSIS — Z79899 Other long term (current) drug therapy: Secondary | ICD-10-CM | POA: Insufficient documentation

## 2022-11-25 DIAGNOSIS — E785 Hyperlipidemia, unspecified: Secondary | ICD-10-CM | POA: Insufficient documentation

## 2022-11-25 DIAGNOSIS — Z87891 Personal history of nicotine dependence: Secondary | ICD-10-CM | POA: Insufficient documentation

## 2022-11-25 DIAGNOSIS — Z8719 Personal history of other diseases of the digestive system: Secondary | ICD-10-CM | POA: Diagnosis not present

## 2022-11-25 DIAGNOSIS — I252 Old myocardial infarction: Secondary | ICD-10-CM | POA: Insufficient documentation

## 2022-11-25 DIAGNOSIS — N400 Enlarged prostate without lower urinary tract symptoms: Secondary | ICD-10-CM | POA: Insufficient documentation

## 2022-11-25 DIAGNOSIS — I4819 Other persistent atrial fibrillation: Secondary | ICD-10-CM | POA: Diagnosis not present

## 2022-11-25 DIAGNOSIS — E039 Hypothyroidism, unspecified: Secondary | ICD-10-CM | POA: Diagnosis not present

## 2022-11-25 DIAGNOSIS — I5042 Chronic combined systolic (congestive) and diastolic (congestive) heart failure: Secondary | ICD-10-CM | POA: Diagnosis not present

## 2022-11-25 DIAGNOSIS — Z951 Presence of aortocoronary bypass graft: Secondary | ICD-10-CM | POA: Diagnosis not present

## 2022-11-25 DIAGNOSIS — Z952 Presence of prosthetic heart valve: Secondary | ICD-10-CM | POA: Diagnosis not present

## 2022-11-25 DIAGNOSIS — Z79890 Hormone replacement therapy (postmenopausal): Secondary | ICD-10-CM | POA: Diagnosis not present

## 2022-11-25 DIAGNOSIS — K219 Gastro-esophageal reflux disease without esophagitis: Secondary | ICD-10-CM | POA: Insufficient documentation

## 2022-11-25 DIAGNOSIS — N189 Chronic kidney disease, unspecified: Secondary | ICD-10-CM | POA: Insufficient documentation

## 2022-11-25 DIAGNOSIS — I2510 Atherosclerotic heart disease of native coronary artery without angina pectoris: Secondary | ICD-10-CM | POA: Insufficient documentation

## 2022-11-25 DIAGNOSIS — R55 Syncope and collapse: Secondary | ICD-10-CM | POA: Insufficient documentation

## 2022-11-25 DIAGNOSIS — I4821 Permanent atrial fibrillation: Secondary | ICD-10-CM | POA: Diagnosis not present

## 2022-11-25 MED ORDER — DAPAGLIFLOZIN PROPANEDIOL 10 MG PO TABS: 10.0000 mg | ORAL_TABLET | Freq: Every day | ORAL | 5 refills | Status: DC

## 2022-11-25 NOTE — Progress Notes (Signed)
ADVANCED HEART FAILURE CLINIC NOTE  Referring Physician: Laurey Morale, MD  Primary Care: Laurey Morale, MD Primary Cardiologist:  HPI: Justin [name] is a 89 y.o. male with permanent atrial fibrillation, aortic stenosis s/p TAVR, medtronic BiV ICD in 2018 after TAVR, CKD, CAD s/p CABG in 2007 presenting today to due to shortness of breath.   Interval hx:  Justin [name] states he is currently in the process of moving. He become quickly short of breath. He can walk roughly 10-21ft before becoming short of breath. During this time he often has to take a break which leads to quick improvement in his symptoms. Over the past several months he feels that these symptoms have progressed. He now feels short of breath with ADLs. He feels much better when he is well diuresed, however, struggles with rises in sCr at that time.    Past Medical History:  Diagnosis Date   Age-related macular degeneration, dry, both eyes    Allergic    "24/7; 365 days/year; I'm allergic to pollens, dust, all southern grasses/trees, mold, mildue, cats, dogs" (01/07/2017)   Anal fissure    Asthma    sees [name]    Benign prostatic hypertrophy    (sees Dr. Denman George   CAD (coronary artery disease)    a. s/p CABG 2007. b. Cath 08/2016 - 4/4 patent grafts.   Carotid bruit    carotid u/s 10/10: 0.39% bilaterally   Chronic combined systolic and diastolic CHF (congestive heart failure)    Complication of anesthesia 1980s   "w/anal cyst OR, he gave me a saddle block then put a narcotic in spinal cord; had a severe reaction to that" (01/07/2017)   Congestive heart failure (CHF)    ED (erectile dysfunction)    Family history of adverse reaction to anesthesia    "daughter wakes up during OR" (01/07/2017)   GERD (gastroesophageal reflux disease)    Gout    HTN (hypertension)    Hx of colonic polyps    (sees Dr. Henrene Pastor)   Hyperlipidemia    Hypothyroidism    Moderate to severe aortic stenosis    a. s/p TAVR 02/2017.    Myocardial infarction ~ 2000   Obesity    Osteoarthritis    "was in my knees, hands" (01/07/2017 )   PAF (paroxysmal atrial fibrillation)    a. documented post TAVR.   Precancerous skin lesion    (sees Dr. 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 90 tablet 3   amiodarone (PACERONE) 200 MG tablet Take 2 tablets by mouth twice daily x 2 weeks then decrease to 2 tablets by mouth daily x 2 weeks 84 tablet 0   aspirin 81 MG tablet Take 1 tablet (81 mg total) by mouth daily. 30 tablet 0   atorvastatin (LIPITOR) 20 MG tablet TAKE ONE TABLET ONCE DAILY 90 tablet 3   benzonatate (TESSALON) 200 MG capsule Take 1 capsule (200 mg total) by mouth 2 (two) times daily as needed for cough. 20 capsule 0   cetirizine (ZYRTEC) 10 MG tablet Take 10 mg by mouth 2 (two) times daily. [employer]/ [employer]  colchicine 0.6 MG tablet Take 1 tablet (0.6 mg total) by mouth every 6 (six) hours as needed (gout). 60 tablet 5   dabigatran (PRADAXA) 150 MG CAPS capsule TAKE 1 CAPSULE BY MOUTH TWICE  DAILY 180 capsule 1   diphenhydrAMINE (BENADRYL) 25 MG tablet Take 25 mg by mouth 2 (two) times daily.     EPINEPHrine 0.3 mg/0.3 mL IJ SOAJ injection Inject 0.3 mLs (0.3 mg total) into the muscle as needed for anaphylaxis. 2 Device 1   fluorouracil (EFUDEX) 5 % cream Apply 1 application  topically 2 (two) times daily as needed (rash).     isosorbide mononitrate (IMDUR) 30 MG 24 hr tablet TAKE ONE TABLET ONCE DAILY 90 tablet 0   ketotifen (ZADITOR) 0.025 % ophthalmic solution Apply 1 drop to eye daily as needed (itchy eyes).      levothyroxine (SYNTHROID) 150 MCG tablet Take 1 tablet (150 mcg  total) by mouth daily. 90 tablet 3   methocarbamol (ROBAXIN) 500 MG tablet Take 1 tablet (500 mg total) by mouth 4 (four) times daily. (Patient taking differently: Take 500 mg by mouth 4 (four) times daily as needed.) 40 tablet 0   metoCLOPramide (REGLAN) 10 MG tablet TAKE ONE TABLET BY MOUTH EVERY MORNING WITH BREAKFAST 30 tablet 3   metolazone (ZAROXOLYN) 2.5 MG tablet Take 1 tablet (2.5 mg total) by mouth 2 (two) times a week. Wednesday and Saturday 24 tablet 3   metoprolol succinate (TOPROL-XL) 50 MG 24 hr tablet Take 75 mg by mouth daily. Take with or immediately following a meal.     montelukast (SINGULAIR) 10 MG tablet TAKE ONE TABLET BY MOUTH ONCE DAILY IN THE EVENING 90 tablet 3   Multiple Vitamins-Minerals (PRESERVISION AREDS PO) Take 2 tablets by mouth every morning.     omeprazole (PRILOSEC) 20 MG capsule TAKE ONE CAPSULE TWICE DAILY 180 capsule 0   potassium chloride SA (KLOR-CON M) 20 MEQ tablet Take 1 tablet (20 mEq total) by mouth 2 (two) times daily. 180 tablet 3   spironolactone (ALDACTONE) 25 MG tablet Take 1 tablet (25 mg total) by mouth daily. 90 tablet 3   torsemide (DEMADEX) 20 MG tablet Take 2 tablets (40 mg total) by mouth every other day. 30 tablet 15   triamcinolone cream (KENALOG) 0.1 % Apply 1 application  topically daily as needed for dry skin.     valACYclovir (VALTREX) 500 MG tablet Take 500 mg by mouth 2 (two) times daily as needed (cold sores).     No current facility-administered medications for this encounter.    Allergies  Allergen Reactions   Peanut-Containing Drug Products Anaphylaxis   Sulfonamide Derivatives Anaphylaxis   Amlodipine Swelling    Swelling in ankles   Eliquis [Apixaban] Other (See Comments)    Back/hip pain   Lisinopril Cough    cough   Xarelto [Rivaroxaban] Other (See Comments)    Back/hip pain      Social History   Socioeconomic History   Marital status: Married    Spouse name: Not on file   Number of children: 3   Years  of education: Not on file   Highest education level: Not on file  Occupational History   Occupation: Clinical biochemist    Comment: builds malls  Tobacco Use   Smoking status: Former    Packs/day: 3.50    Years: 13.00    Additional pack years: 0.00    Total pack years: 45.50    Types: Cigarettes    Quit date:  1963    Years since quitting: 61.2   Smokeless tobacco: Never   Tobacco comments:    Former smoker 04/19/21  Vaping Use   Vaping Use: Never used  Substance and Sexual Activity   Alcohol use: Not Currently    Alcohol/week: 7.0 standard drinks of alcohol    Types: 7 Cans of beer per week    Comment: 1 beer daily after 5pm (04/19/21)   Drug use: No   Sexual activity: Never  Other Topics Concern   Not on file  Social History Narrative   FH of CAD, Male 1st degree relative less than age 82.   Social Determinants of Health   Financial Resource Strain: Medium Risk (07/31/2022)   Overall Financial Resource Strain (CARDIA)    Difficulty of Paying Living Expenses: Somewhat hard  Food Insecurity: No Food Insecurity (03/25/2022)   Hunger Vital Sign    Worried About Running Out of Food in the Last Year: Never true    Ran Out of Food in the Last Year: Never true  Transportation Needs: No Transportation Needs (03/25/2022)   PRAPARE - Hydrologist (Medical): No    Lack of Transportation (Non-Medical): No  Physical Activity: Inactive (03/25/2022)   Exercise Vital Sign    Days of Exercise per Week: 0 days    Minutes of Exercise per Session: 0 min  Stress: No Stress Concern Present (03/25/2022)   [place]    Feeling of Stress : Not at all  Social Connections: Moderately Isolated (03/22/2021)   Social Connection and Isolation Panel [NHANES]    Frequency of Communication with Friends and Family: Three times a week    Frequency of Social Gatherings with Friends and Family: Three times a week     Attends Religious Services: Never    Active Member of Clubs or Organizations: No    Attends Archivist Meetings: Never    Marital Status: Married  Human resources officer Violence: Not At Risk (03/22/2021)   Humiliation, Afraid, Rape, and Kick questionnaire    Fear of Current or Ex-Partner: No    Emotionally Abused: No    Physically Abused: No    Sexually Abused: No      Family History  Problem Relation Age of Onset   Heart attack Father 58   Allergic rhinitis Father    Asthma Father    Heart failure Mother 25   Uterine cancer Mother    Breast cancer Mother    Colon cancer Neg Hx    Esophageal cancer Neg Hx     PHYSICAL EXAM: Vitals:   11/25/22 1028  BP: 128/80  Pulse: 98  SpO2: 97%   GENERAL: elderly male; NAD HEENT: Negative for arcus senilis or xanthelasma. There is no scleral icterus.  The mucous membranes are pink and moist.   NECK: Supple, No masses. Normal carotid upstrokes without bruits. No masses or thyromegaly.    CHEST: There are no chest wall deformities. There is no chest wall tenderness. Respirations are unlabored.  Lungs- reduced at bases with mild inspiratory crackles CARDIAC:  JVP: 8 cm H2O         Normal S1, S2  Normal rate with regular rhythm. No murmurs, rubs or gallops.  Pulses are 2+ and symmetrical in upper and lower extremities. 1+ edema.  ABDOMEN: Soft, non-tender, non-distended. There are no masses or hepatomegaly. There are normal bowel sounds.  EXTREMITIES: Warm and well perfused with no cyanosis,  clubbing.  LYMPHATIC: No axillary or supraclavicular lymphadenopathy.  NEUROLOGIC: Patient is oriented x3 with no focal or lateralizing neurologic deficits.  PSYCH: Patients affect is appropriate, there is no evidence of anxiety or depression.  SKIN: Warm and dry; no lesions or wounds.   DATA REVIEW  ECG: BIV paced, atrial fibrillation  as per my personal interpretation  ECHO: 11/12/22: LVEF 45-50%, RV function low normal.    CATH: LHC/RHC: 08/2016:  There is moderate aortic valve stenosis. SVG graft was visualized by angiography and is normal in caliber. 1st Mrg lesion, 65 %stenosed. Ost LM to LM lesion, 100 %stenosed. SVG graft was visualized by angiography and is normal in caliber. LIMA graft was visualized by angiography and is normal in caliber. Prox RCA to Mid RCA lesion, 100 %stenosed. SVG graft was visualized by angiography and is normal in caliber and anatomically normal. Hemodynamic findings consistent with mild pulmonary hypertension.   1. 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  2. The Left main is occluded at the ostium.  3. The LAD fills from the patent IMA graft and the Diagonal fills from the patent vein graft 4. The Circumflex fills from the patent vein graft to the OM. There is a moderate stenosis in the proximal segment of the OM branch proximal to the insertion of the vein graft.  5. The distal RCA fills from the patent vein graft.  6. Moderate AS 7. 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  NYHA class / AHA Stage:IIB-III Volume status & Diuretics:  euvolemic, REDS 28. Continue torsemide 40mg  daily with metolazone 2.5mg  twice weekly Vasodilators:Imdur 30mg , add entresto at follow up.  Beta-Blocker:toprol 75mg  daily [id number] 25mg  daily Cardiometabolic:start farxiga 10mg  daily Devices therapies & Valvulopathies:CRT-D Advanced therapies:not a candidate  2. Atrial fibrillation & frequent PVCs  - amiodarone, pradaxa  3. CAD s/p CABG  - CABG in 2007; [place] in 2018  4. Aortic stenosis s/p TAVR - CRT-D placed due to syncope and TAVR  5. Hypothyroidism - synthroid 132mcg  Ronie Fleeger Advanced Heart Failure Mechanical Circulatory Support

## 2022-11-25 NOTE — Patient Instructions (Signed)
Good to see you today!  START Farxiga 10 mg daily  Lab work in 10 days  Your physician recommends that you schedule a follow-up appointment in: 6 months(October) Call office in August to  Schedule an appointment  If you have any questions or concerns before your next appointment please send [place] a message through [place] or call our office at [phone].    TO LEAVE A MESSAGE FOR THE NURSE SELECT OPTION 2, PLEASE LEAVE A MESSAGE INCLUDING: YOUR NAME DATE OF BIRTH CALL BACK NUMBER REASON FOR CALL**this is important as we prioritize the call backs  YOU WILL RECEIVE A CALL BACK THE SAME DAY AS LONG AS YOU CALL BEFORE 4:00 PM  At the [facility], you and your health needs are our priority. As part of our continuing mission to provide you with exceptional heart care, we have created designated Provider Care Teams. These Care Teams include your primary Cardiologist (physician) and Advanced Practice Providers (APPs- Physician Assistants and Nurse Practitioners) who all work together to provide you with the care you need, when you need it.   You may see any of the following providers on your designated Care Team at your next follow up: Dr Glori Bickers Dr Loralie Champagne Dr. Roxana Hires, NP Lyda Jester, [place] [facility] [place], [place] Forestine Na, NP Audry Riles, PharmD   Please be sure to bring in all your medications bottles to every appointment.    Thank you for choosing [facility]

## 2022-11-25 NOTE — Progress Notes (Signed)
ReDS Vest / Clip - 11/25/22 1100       ReDS Vest / Clip   Station Marker D    Ruler Value 36    ReDS Value Range Low volume    ReDS Actual Value 28

## 2022-11-26 ENCOUNTER — Encounter (HOSPITAL_COMMUNITY): Payer: Medicare Other

## 2022-12-02 ENCOUNTER — Ambulatory Visit: Payer: Medicare Other | Attending: Internal Medicine

## 2022-12-02 ENCOUNTER — Telehealth: Payer: Self-pay

## 2022-12-02 DIAGNOSIS — I5042 Chronic combined systolic (congestive) and diastolic (congestive) heart failure: Secondary | ICD-10-CM | POA: Diagnosis not present

## 2022-12-02 DIAGNOSIS — Z95810 Presence of automatic (implantable) cardiac defibrillator: Secondary | ICD-10-CM | POA: Diagnosis not present

## 2022-12-02 NOTE — Progress Notes (Unsigned)
Care Management & Coordination Services Pharmacy Team  Reason for Encounter: Hypertension  Contacted patient to discuss hypertension disease state. {US HC Outreach:28874}   SCHEDULE FU JULY Current antihypertensive regimen:  Imdur 30 mg daily Metoprolol 50 mg take 1.5 daily Spironolactone 25 mg daily Patient verbally confirms he is taking the above medications as directed. {yes/no:20286}  How often are you checking your Blood Pressure? {CHL HP BP Monitoring Frequency:[phone]}  he checks his blood pressure {timing:25218} {before/after:25217} taking his medication.  Current home BP readings: *** DATE:             BP               PULSE   Wrist or arm cuff:  OTC medications including pseudoephedrine or NSAIDs?  Any readings above 180/100? {yes/no:20286} If yes any symptoms of hypertensive emergency? {hypertensive emergency symptoms:25354}  What recent interventions/DTPs have been made by any provider to improve Blood Pressure control since last CPP Visit: ***  Any recent hospitalizations or ED visits since last visit with CPP? {yes/no:20286}  What diet changes have been made to improve Blood Pressure Control?  Patient eating smaller amounts and mediterranean diet Breakfast - boiled egg and toast or cereal Lunch - glass of V8 and crackers with cheese Dinner - meat and vegetable, sometimes rice or pasta Caffeine intake -  Salt intake -   What exercise is being done to improve your Blood Pressure Control?  Patient walks in a store 3 times a week,  walking around the house and he does all the cooking.  Adherence Review: Is the patient currently on ACE/ARB medication? No Does the patient have >5 day gap between last estimated fill dates? No  Care Gaps: AWV - completed 03/25/2022 Covid - Postponed   Star Rating Drug: Atorvastatin 20mg  - last filled 10/18/2022 90 DS at [facility] 10 mg - last filled 11/25/2022 30 DS at [facility]  Chart Updates: Recent office  visits:  10/10/2022 Gershon Crane MD - Patient was seen for essential hypertension and additional concerns. Discontinued Amoxicillin and Benzonatate.   Recent consult visits:  11/13/2022 Sherryl Manges MD (cardiology) - Patient was seen for Persistent atrial fibrillation and additional concerns. Started Amiodarone 200 mg daily.   10/31/2022 Maxine Glenn PA-C (cardiology) - Patient was seen for Chronic combined systolic and diastolic heart failure and additional concerns. Changed Torsemide 40mg  every other day. Discontinued Entresto.   10/16/2022 Maxine Glenn PA-C (cardiology) - Patient was seen for Chronic combined systolic and diastolic heart failure and additional concerns. Decreased Entresto to 49/51 mg twice daily.   09/13/2022 Willia Craze MD (ortho) - Patient was seen for pain in right paraspinal region. Started Oxycodone 5 mg q 4 hours prn.  09/06/2022 Willia Craze MD (ortho) - Patient was seen for Low back pain, unspecified back pain laterality, unspecified chronicity, unspecified whether sciatica present. Started Methocarbamol and Oxycodone.   08/01/2022 Aart Schulenklopper (physical therapy) - Patient was seen for low back pain. No additional chart notes.  Hospital visits:  None  Medications: Outpatient Encounter Medications as of 12/02/2022  Medication Sig   allopurinol (ZYLOPRIM) 100 MG tablet Take 1 tablet (100 mg total) by mouth daily.   amiodarone (PACERONE) 200 MG tablet Take 2 tablets by mouth twice daily x 2 weeks then decrease to 2 tablets by mouth daily x 2 weeks   aspirin 81 MG tablet Take 1 tablet (81 mg total) by mouth daily.   atorvastatin (LIPITOR) 20 MG tablet TAKE ONE TABLET ONCE DAILY  benzonatate (TESSALON) 200 MG capsule Take 1 capsule (200 mg total) by mouth 2 (two) times daily as needed for cough.   cetirizine (ZYRTEC) 10 MG tablet Take 10 mg by mouth 2 (two) times daily. [employer]   colchicine 0.6 MG tablet Take 1 tablet (0.6 mg total) by mouth  every 6 (six) hours as needed (gout).   dabigatran (PRADAXA) 150 MG CAPS capsule TAKE 1 CAPSULE BY MOUTH TWICE  DAILY   dapagliflozin propanediol (FARXIGA) 10 MG TABS tablet Take 1 tablet (10 mg total) by mouth daily before breakfast.   diphenhydrAMINE (BENADRYL) 25 MG tablet Take 25 mg by mouth 2 (two) times daily.   EPINEPHrine 0.3 mg/0.3 mL IJ SOAJ injection Inject 0.3 mLs (0.3 mg total) into the muscle as needed for anaphylaxis.   fluorouracil (EFUDEX) 5 % cream Apply 1 application  topically 2 (two) times daily as needed (rash).   isosorbide mononitrate (IMDUR) 30 MG 24 hr tablet TAKE ONE TABLET ONCE DAILY   ketotifen (ZADITOR) 0.025 % ophthalmic solution Apply 1 drop to eye daily as needed (itchy eyes).    levothyroxine (SYNTHROID) 150 MCG tablet Take 1 tablet (150 mcg total) by mouth daily.   methocarbamol (ROBAXIN) 500 MG tablet Take 1 tablet (500 mg total) by mouth 4 (four) times daily. (Patient taking differently: Take 500 mg by mouth 4 (four) times daily as needed.)   metoCLOPramide (REGLAN) 10 MG tablet TAKE ONE TABLET BY MOUTH EVERY MORNING WITH BREAKFAST   metolazone (ZAROXOLYN) 2.5 MG tablet Take 1 tablet (2.5 mg total) by mouth 2 (two) times a week. Wednesday and Saturday   metoprolol succinate (TOPROL-XL) 50 MG 24 hr tablet Take 75 mg by mouth daily. Take with or immediately following a meal.   montelukast (SINGULAIR) 10 MG tablet TAKE ONE TABLET BY MOUTH ONCE DAILY IN THE EVENING   Multiple Vitamins-Minerals (PRESERVISION AREDS PO) Take 2 tablets by mouth every morning.   omeprazole (PRILOSEC) 20 MG capsule TAKE ONE CAPSULE TWICE DAILY   potassium chloride SA (KLOR-CON M) 20 MEQ tablet Take 1 tablet (20 mEq total) by mouth 2 (two) times daily.   spironolactone (ALDACTONE) 25 MG tablet Take 1 tablet (25 mg total) by mouth daily.   torsemide (DEMADEX) 20 MG tablet Take 2 tablets (40 mg total) by mouth every other day.   triamcinolone cream (KENALOG) 0.1 % Apply 1 application   topically daily as needed for dry skin.   valACYclovir (VALTREX) 500 MG tablet Take 500 mg by mouth 2 (two) times daily as needed (cold sores).   No facility-administered encounter medications on file as of 12/02/2022.  Fill History:  Dispensed Days Supply Quantity Provider Pharmacy  allopurinol 100 mg tablet 10/18/2022 90 90 each      Dispensed Days Supply Quantity Provider Pharmacy  amiodarone 200 mg tablet 11/13/2022 28 84 each      Dispensed Days Supply Quantity Provider Pharmacy  atorvastatin 20 mg tablet 10/18/2022 90 90 each      Dispensed Days Supply Quantity Provider Pharmacy  colchicine 0.6 mg tablet 10/30/2021 15 60 each      Dispensed Days Supply Quantity Provider Pharmacy  DABIGATRAN ETEXILATE  150 MG CAPS 07/17/2022 90 180 capsule      Dispensed Days Supply Quantity Provider Pharmacy  Farxiga 10 mg tablet 11/25/2022 30 30 each      Dispensed Days Supply Quantity Provider Pharmacy  fluorouracil 5 % topical cream 05/30/2021 30 40 g      Dispensed Days Supply Quantity  Provider Pharmacy  isosorbide mononitrate ER 30 mg tablet,extended release 24 hr 10/31/2022 90 90 each      Dispensed Days Supply Quantity Provider Pharmacy  levothyroxine 150 mcg tablet 10/11/2022 90 90 each      Dispensed Days Supply Quantity Provider Pharmacy  methocarbamol 500 mg tablet 09/24/2022 10 40 each      Dispensed Days Supply Quantity Provider Pharmacy  metoclopramide 10 mg tablet 11/11/2022 30 30 each      Dispensed Days Supply Quantity Provider Pharmacy  metolazone 2.5 mg tablet 08/20/2022 84 24 each      Dispensed Days Supply Quantity Provider Pharmacy  metoprolol succinate ER 50 mg tablet,extended release 24 hr 07/17/2022 90 135 each      Dispensed Days Supply Quantity Provider Pharmacy  montelukast 10 mg tablet 11/11/2022 90 90 each      Dispensed Days Supply Quantity Provider Pharmacy  omeprazole 20 mg capsule,delayed release 11/11/2022 90 180 each      Dispensed Days Supply  Quantity Provider Pharmacy  potassium chloride ER 20 mEq tablet,extended release(part/cryst) 10/05/2022 90 90 each      Dispensed Days Supply Quantity Provider Pharmacy  spironolactone 25 mg tablet 10/18/2022 90 90 each      Dispensed Days Supply Quantity Provider Pharmacy  torsemide 20 mg tablet 10/17/2022 92 186 each     Recent Office Vitals: BP Readings from Last 3 Encounters:  11/25/22 128/80  11/13/22 104/68  10/31/22 (!) 86/0   Pulse Readings from Last 3 Encounters:  11/25/22 98  11/13/22 95  10/31/22 97    Wt Readings from Last 3 Encounters:  11/25/22 275 lb (124.7 kg)  11/13/22 280 lb 9.6 oz (127.3 kg)  10/31/22 278 lb 12.8 oz (126.5 kg)     Kidney Function Lab Results  Component Value Date/Time   CREATININE 1.39 (H) 11/20/2022 01:42 PM   CREATININE 1.81 (H) 10/31/2022 10:39 AM   CREATININE 1.27 (H) 03/21/2020 09:43 AM   CREATININE 0.74 08/14/2016 01:11 PM   GFR 37.42 (L) 10/10/2022 11:43 AM   GFRNONAA 51 (L) 01/30/2022 04:06 PM   GFRAA 66 04/04/2020 11:32 AM       Latest Ref Rng & Units 11/20/2022    1:42 PM 10/31/2022   10:39 AM 10/22/2022    2:40 PM  BMP  Glucose 70 - 99 mg/dL 244  010  272   BUN 8 - 27 mg/dL 28  44  65   Creatinine 0.76 - 1.27 mg/dL 5.36  6.44  0.34   BUN/Creat Ratio 10 - 24 20  24  27    Sodium 134 - 144 mmol/L 139  132  135   Potassium 3.5 - 5.2 mmol/L 3.2  4.3  4.2   Chloride 96 - 106 mmol/L 95  94  93   CO2 20 - 29 mmol/L 25  21  23    Calcium 8.6 - 10.2 mg/dL 8.6  8.3  8.3     Inetta Fermo [facility] [place]  Clinical Pharmacist Assistant [phone]

## 2022-12-04 ENCOUNTER — Telehealth: Payer: Self-pay

## 2022-12-04 ENCOUNTER — Ambulatory Visit (HOSPITAL_COMMUNITY)
Admission: RE | Admit: 2022-12-04 | Discharge: 2022-12-04 | Disposition: A | Discharge status: Home / Self Care | Payer: Medicare Other | Source: Ambulatory Visit | Attending: Internal Medicine | Admitting: Internal Medicine

## 2022-12-04 ENCOUNTER — Other Ambulatory Visit (HOSPITAL_COMMUNITY): Payer: Self-pay | Admitting: Cardiology

## 2022-12-04 DIAGNOSIS — I5042 Chronic combined systolic (congestive) and diastolic (congestive) heart failure: Secondary | ICD-10-CM | POA: Insufficient documentation

## 2022-12-04 LAB — BASIC METABOLIC PANEL
Anion gap: 12 (ref 5–15)
BUN: 26 mg/dL — ABNORMAL HIGH (ref 8–23)
CO2: 26 mmol/L (ref 22–32)
Calcium: 8.2 mg/dL — ABNORMAL LOW (ref 8.9–10.3)
Chloride: 102 mmol/L (ref 98–111)
Creatinine, Ser: 1.68 mg/dL — ABNORMAL HIGH (ref 0.61–1.24)
GFR, Estimated: 39 mL/min — ABNORMAL LOW (ref >60)
Glucose, Bld: 108 mg/dL — ABNORMAL HIGH (ref 70–99)
Potassium: 4.6 mmol/L (ref 3.5–5.1)
Sodium: 140 mmol/L (ref 135–145)

## 2022-12-04 LAB — BRAIN NATRIURETIC PEPTIDE: B Natriuretic Peptide: 398.3 pg/mL — ABNORMAL HIGH (ref 0.0–100.0)

## 2022-12-04 NOTE — Telephone Encounter (Signed)
Attempted call to patient regarding ICM remote transmission and left detailed  message per DPR.  Advised to return call for any fluid symptoms or questions. Next ICM remote transmission scheduled 01/06/2023.

## 2022-12-04 NOTE — Progress Notes (Signed)
EPIC Encounter for ICM Monitoring  Patient Name: Justin [name] is a 89 y.o. male Date: 12/04/2022 Primary Care Physican: Nelwyn Salisbury, MD Primary Cardiologist: McAlhany/Sabharwal Electrophysiologist: Joycelyn Schmid Pacing: 84.7% Effective 83%  01/08/2022 Weight: 260 lbs 06/17/2022 Weight: 272 lbs (baseline 269 lbs) 08/13/2022 Weight: 272 lbs 09/27/2022 Weight: 270 lbs   Battery Longevity: 4 months   Since 25-Nov-2022 AT/AF  1 Time in AT/AF  24.0 hr/day (100.0%) Longest AT/AF  18 days   Attempted call to patient and unable to reach.  Left detailed message per DPR regarding transmission. Transmission reviewed.     Optivol thoracic impedance suggesting possible fluid accumulation starting 3/4 and returned to normal on 4/6.     Prescribed: Torsemide 20 mg take 2 tablets (40 mg total) every day.   Potassium 20 mEq take 1 tablet by mouth twice a day Metolazone 2.5 mg Take 1 tablet by mouth twice a week Wednesday and Saturday (increased on 3/29). Spironolactone 25 mg take 1 tablet daily   Labs: 11/20/2022 Creatinine 1.39, BUN 28, Potassium 3.2, Sodium 139, GFR 49  10/31/2022 Creatinine 1.81, BUN 44, Potassium 4.3, Sodium 132, GFR 36  10/22/2022 Creatinine 2.37, BUN 65, Potassium 4.2, Sodium 135  10/16/2022 Creatinine 1.54, BUN 31, Potassium 5.6, Sodium 134  10/10/2022 Creatinine 1.65, BUN 36, Potassium 4.6, Sodium 136, GFR 37.42  04/01/2022 Creatinine 1.58, BUN 36, Potassium 4.5, Sodium 135, GFR 43 02/19/2022 Creatinine 1.43, BUN 33, Potassium 4.9, Sodium 134, GFR 48  A complete set of results can be found in Results Review.   Recommendations: Left voice mail with ICM number and encouraged to call if experiencing any fluid symptoms.   Follow-up plan: ICM clinic phone appointment on 01/06/2023.   91 day device clinic remote transmission 01/09/2023.     EP/Cardiology Office Visits: 02/17/2023 with Otilio Saber, PA.   01/13/2023 with Dr Clifton James.  11/13/2022 with Dr Graciela Husbands.   Copy of ICM  check sent to Dr. Graciela Husbands.   3 month ICM trend: 12/02/2022.    12-14 Month ICM trend:     Karie Soda, RN 12/04/2022 10:29 AM

## 2022-12-04 NOTE — Telephone Encounter (Signed)
Remote ICM transmission received.  Attempted call to patient regarding ICM remote transmission and he was in the doctors office.  He requested call back.

## 2022-12-04 NOTE — Progress Notes (Signed)
Orders placed for labs  Unable to have done at office

## 2022-12-09 ENCOUNTER — Ambulatory Visit (INDEPENDENT_AMBULATORY_CARE_PROVIDER_SITE_OTHER): Payer: Medicare Other

## 2022-12-09 ENCOUNTER — Other Ambulatory Visit: Payer: Self-pay | Admitting: Family Medicine

## 2022-12-09 DIAGNOSIS — I5042 Chronic combined systolic (congestive) and diastolic (congestive) heart failure: Secondary | ICD-10-CM

## 2022-12-09 DIAGNOSIS — H353221 Exudative age-related macular degeneration, left eye, with active choroidal neovascularization: Secondary | ICD-10-CM | POA: Diagnosis not present

## 2022-12-10 LAB — CUP PACEART REMOTE DEVICE CHECK
Battery Remaining Longevity: 4 mo
Battery Voltage: 2.8 V
Brady Statistic RA Percent Paced: 1.18 %
Brady Statistic RV Percent Paced: 90.28 %
Date Time Interrogation Session: 20240415012204
HighPow Impedance: 75 Ohm
Implantable Lead Connection Status: 753985
Implantable Lead Connection Status: 753985
Implantable Lead Connection Status: 753985
Implantable Lead Implant Date: 20180907
Implantable Lead Implant Date: 20180907
Implantable Lead Implant Date: 20180907
Implantable Lead Location: 753858
Implantable Lead Location: 753859
Implantable Lead Location: 753860
Implantable Lead Model: 4396
Implantable Lead Model: 5076
Implantable Pulse Generator Implant Date: 20180907
Lead Channel Impedance Value: 304 Ohm
Lead Channel Impedance Value: 342 Ohm
Lead Channel Impedance Value: 342 Ohm
Lead Channel Impedance Value: 494 Ohm
Lead Channel Impedance Value: 627 Ohm
Lead Channel Impedance Value: 969 Ohm
Lead Channel Pacing Threshold Amplitude: 0.875 V
Lead Channel Pacing Threshold Amplitude: 1 V
Lead Channel Pacing Threshold Amplitude: 1.5 V
Lead Channel Pacing Threshold Pulse Width: 0.4 ms
Lead Channel Pacing Threshold Pulse Width: 0.4 ms
Lead Channel Pacing Threshold Pulse Width: 1 ms
Lead Channel Sensing Intrinsic Amplitude: 0.875 mV
Lead Channel Sensing Intrinsic Amplitude: 0.875 mV
Lead Channel Sensing Intrinsic Amplitude: 8.375 mV
Lead Channel Sensing Intrinsic Amplitude: 8.375 mV
Lead Channel Setting Pacing Amplitude: 1.75 V
Lead Channel Setting Pacing Amplitude: 2.25 V
Lead Channel Setting Pacing Amplitude: 2.5 V
Lead Channel Setting Pacing Pulse Width: 0.4 ms
Lead Channel Setting Pacing Pulse Width: 1 ms
Lead Channel Setting Sensing Sensitivity: 0.3 mV
Zone Setting Status: 755011

## 2022-12-11 NOTE — Addendum Note (Signed)
Addended by: Elease Etienne A on: 12/11/2022 12:29 PM   Modules accepted: Level of Service

## 2022-12-11 NOTE — Progress Notes (Signed)
Remote ICD transmission.   

## 2022-12-18 ENCOUNTER — Other Ambulatory Visit: Payer: Self-pay | Admitting: Family Medicine

## 2022-12-23 ENCOUNTER — Telehealth: Payer: Self-pay

## 2022-12-23 ENCOUNTER — Telehealth: Payer: Self-pay | Admitting: Family Medicine

## 2022-12-23 NOTE — Telephone Encounter (Signed)
Spoke with pt and advised per Dr Graciela Husbands TSH needs follow up.  Pt states Dr Clent Ridges manages his thyroid medication and will contact provider to make him aware. Will forward lab result to PCP as well.  Pt verbalizes understanding and thanked Charity fundraiser for the call.

## 2022-12-23 NOTE — Telephone Encounter (Signed)
-----   Message from Duke Salvia, MD sent at 12/16/2022  4:32 PM EDT ----- Please Inform Patient   Labs show somewhat lower TSH 26 (2/24) >>16.2    Who is managing his TSH  and was it adjusted following the level in February    Thanks

## 2022-12-23 NOTE — Telephone Encounter (Signed)
Patient wants to know if you would like to adjust this medication levothyroxine (SYNTHROID) 150 MCG tablet. Spoke with cardiologist and they said his levels were low.   Also requesting a refill

## 2022-12-25 ENCOUNTER — Other Ambulatory Visit: Payer: Self-pay

## 2022-12-25 ENCOUNTER — Telehealth: Payer: Self-pay

## 2022-12-25 DIAGNOSIS — E039 Hypothyroidism, unspecified: Secondary | ICD-10-CM

## 2022-12-25 MED ORDER — LEVOTHYROXINE SODIUM 200 MCG PO TABS: 200.0000 ug | ORAL_TABLET | Freq: Every day | ORAL | 3 refills | Status: DC

## 2022-12-25 NOTE — Telephone Encounter (Signed)
I have already addressed this on 12-23-22, but apparently it has not been taken care of. Please call in Synthroid 200 mcg daily, #90 with 3 rf. Recheck TSH, free T3, and free T4 in 90 days

## 2022-12-25 NOTE — Telephone Encounter (Signed)
Pt advise of change in his Thyroid medication, New Rx sent to pt pharmacy and future labs placed on  epic, Pt has a lab appointment scheduled in 3 months per Dr Clent Ridges

## 2022-12-25 NOTE — Telephone Encounter (Signed)
Pt future labs placed and lab appointment scheduled

## 2022-12-27 ENCOUNTER — Other Ambulatory Visit: Payer: Self-pay

## 2022-12-27 DIAGNOSIS — E039 Hypothyroidism, unspecified: Secondary | ICD-10-CM

## 2023-01-06 ENCOUNTER — Ambulatory Visit: Payer: Medicare Other | Attending: Internal Medicine

## 2023-01-06 DIAGNOSIS — I5042 Chronic combined systolic (congestive) and diastolic (congestive) heart failure: Secondary | ICD-10-CM | POA: Diagnosis not present

## 2023-01-06 DIAGNOSIS — Z95810 Presence of automatic (implantable) cardiac defibrillator: Secondary | ICD-10-CM

## 2023-01-07 DIAGNOSIS — Z961 Presence of intraocular lens: Secondary | ICD-10-CM | POA: Diagnosis not present

## 2023-01-07 LAB — HM DIABETES EYE EXAM

## 2023-01-08 ENCOUNTER — Telehealth: Payer: Self-pay

## 2023-01-08 NOTE — Progress Notes (Signed)
EPIC Encounter for ICM Monitoring  Patient Name: Justin [name] is a 89 y.o. male Date: 01/08/2023 Primary Care Physican: Nelwyn Salisbury, MD Primary Cardiologist: McAlhany/Sabharwal Electrophysiologist: Joycelyn Schmid Pacing: 85.9% Effective 83%  01/08/2022 Weight: 260 lbs 06/17/2022 Weight: 272 lbs (baseline 269 lbs) 08/13/2022 Weight: 272 lbs 09/27/2022 Weight: 270 lbs   Battery Longevity: 3 months   Since 09-Dec-2022 AT/AF  1 Time in AT/AF  24.0 hr/day (100.0%) Longest AT/AF  53 days   Attempted call to patient and unable to reach.  Left detailed message per DPR regarding transmission. Transmission reviewed.     Optivol thoracic impedance normal but was suggesting possible fluid from 4/30-5/7.     Prescribed: Torsemide 20 mg take 2 tablets (40 mg total) every other day.   Potassium 20 mEq take 1 tablet by mouth twice a day Metolazone 2.5 mg Take 1 tablet by mouth twice a week Wednesday and Saturday (increased on 3/29). Spironolactone 25 mg take 1 tablet daily   Labs: 12/04/2022 Creatinine 1.68, BUN 26, Potassium 4.6, Sodium 140, GFR 39 11/20/2022 Creatinine 1.39, BUN 28, Potassium 3.2, Sodium 139, GFR 49  10/31/2022 Creatinine 1.81, BUN 44, Potassium 4.3, Sodium 132, GFR 36  10/22/2022 Creatinine 2.37, BUN 65, Potassium 4.2, Sodium 135  10/16/2022 Creatinine 1.54, BUN 31, Potassium 5.6, Sodium 134  10/10/2022 Creatinine 1.65, BUN 36, Potassium 4.6, Sodium 136, GFR 37.42  04/01/2022 Creatinine 1.58, BUN 36, Potassium 4.5, Sodium 135, GFR 43 02/19/2022 Creatinine 1.43, BUN 33, Potassium 4.9, Sodium 134, GFR 48  A complete set of results can be found in Results Review.   Recommendations: Left voice mail with ICM number and encouraged to call if experiencing any fluid symptoms.   Follow-up plan: ICM clinic phone appointment on 02/10/2023.   91 day device clinic remote transmission 04/14/2023.     EP/Cardiology Office Visits: 02/17/2023 with Otilio Saber, PA.   01/13/2023 with Dr  Clifton James.     Copy of ICM check sent to Dr. Graciela Husbands.   3 month ICM trend: 01/06/2023.    12-14 Month ICM trend:     Karie Soda, RN 01/08/2023 12:59 PM

## 2023-01-08 NOTE — Telephone Encounter (Signed)
Remote ICM transmission received.  Attempted call to patient regarding ICM remote transmission and left detailed message per DPR.  Advised to return call for any fluid symptoms or questions. Next ICM remote transmission scheduled 02/10/2023.    

## 2023-01-09 ENCOUNTER — Ambulatory Visit (INDEPENDENT_AMBULATORY_CARE_PROVIDER_SITE_OTHER): Payer: Medicare Other

## 2023-01-09 DIAGNOSIS — I495 Sick sinus syndrome: Secondary | ICD-10-CM | POA: Diagnosis not present

## 2023-01-13 ENCOUNTER — Ambulatory Visit: Payer: Medicare Other | Attending: Cardiovascular Disease | Admitting: Cardiovascular Disease

## 2023-01-13 ENCOUNTER — Encounter: Payer: Self-pay | Admitting: Cardiovascular Disease

## 2023-01-13 VITALS — BP 112/70 | HR 93 | Ht 66.5 in | Wt 266.2 lb

## 2023-01-13 DIAGNOSIS — I2510 Atherosclerotic heart disease of native coronary artery without angina pectoris: Secondary | ICD-10-CM

## 2023-01-13 DIAGNOSIS — I255 Ischemic cardiomyopathy: Secondary | ICD-10-CM | POA: Diagnosis not present

## 2023-01-13 DIAGNOSIS — I5043 Acute on chronic combined systolic (congestive) and diastolic (congestive) heart failure: Secondary | ICD-10-CM

## 2023-01-13 DIAGNOSIS — Z952 Presence of prosthetic heart valve: Secondary | ICD-10-CM | POA: Diagnosis not present

## 2023-01-13 DIAGNOSIS — E7849 Other hyperlipidemia: Secondary | ICD-10-CM | POA: Diagnosis not present

## 2023-01-13 DIAGNOSIS — I4819 Other persistent atrial fibrillation: Secondary | ICD-10-CM

## 2023-01-13 LAB — CUP PACEART REMOTE DEVICE CHECK
Battery Remaining Longevity: 3 mo
Battery Voltage: 2.78 V
Brady Statistic RA Percent Paced: 0.6 %
Brady Statistic RV Percent Paced: 84.34 %
Date Time Interrogation Session: 20240520205323
HighPow Impedance: 94 Ohm
Implantable Lead Connection Status: 753985
Implantable Lead Connection Status: 753985
Implantable Lead Connection Status: 753985
Implantable Lead Implant Date: 20180907
Implantable Lead Implant Date: 20180907
Implantable Lead Implant Date: 20180907
Implantable Lead Location: 753858
Implantable Lead Location: 753859
Implantable Lead Location: 753860
Implantable Lead Model: 4396
Implantable Lead Model: 5076
Implantable Pulse Generator Implant Date: 20180907
Lead Channel Impedance Value: 1140 Ohm
Lead Channel Impedance Value: 380 Ohm
Lead Channel Impedance Value: 399 Ohm
Lead Channel Impedance Value: 456 Ohm
Lead Channel Impedance Value: 646 Ohm
Lead Channel Impedance Value: 665 Ohm
Lead Channel Pacing Threshold Amplitude: 0.875 V
Lead Channel Pacing Threshold Amplitude: 1.125 V
Lead Channel Pacing Threshold Amplitude: 1.625 V
Lead Channel Pacing Threshold Pulse Width: 0.4 ms
Lead Channel Pacing Threshold Pulse Width: 0.4 ms
Lead Channel Pacing Threshold Pulse Width: 1 ms
Lead Channel Sensing Intrinsic Amplitude: 1.25 mV
Lead Channel Sensing Intrinsic Amplitude: 1.25 mV
Lead Channel Sensing Intrinsic Amplitude: 11 mV
Lead Channel Sensing Intrinsic Amplitude: 11 mV
Lead Channel Setting Pacing Amplitude: 1.75 V
Lead Channel Setting Pacing Amplitude: 2.25 V
Lead Channel Setting Pacing Amplitude: 2.5 V
Lead Channel Setting Pacing Pulse Width: 0.4 ms
Lead Channel Setting Pacing Pulse Width: 1 ms
Lead Channel Setting Sensing Sensitivity: 0.3 mV
Zone Setting Status: 755011

## 2023-01-13 NOTE — Progress Notes (Signed)
Remote ICD transmission.   

## 2023-01-13 NOTE — Addendum Note (Signed)
Addended by: Geralyn Flash D on: 01/13/2023 01:19 PM   Modules accepted: Level of Service

## 2023-01-13 NOTE — Progress Notes (Signed)
Chief Complaint  Patient presents with   Follow-up    CAD, CHF   History of Present Illness: 89 yo male with history of HTN, HLD, severe aortic stenosis s/p TAVR, PAF, chronic diastolic and systolic CHF, CAD s/p 4V CABG, syncope, junctional bradycardia, PVCs, non-sustained VT and LBBB s/p ICD implantation who is here today for cardiac follow up. He is known to have CAD s/p 4V CABG in March 2007 (LIMA to LAD, SVG to Diagonal, SVG to OM, SVG to RCA). Last cardiac cath in January 2018 with 4/4 patent bypass grafts. He had progression of his aortic stenosis with worsened dyspnea and acute on chronic CHF in the spring of 2018. Echo May 2018 with mildly reduced LV systolic function, LVEF=40-45%. His aortic stenosis was severe. He underwent TAVR on 03/04/17 with a 29 mm Edwards Sapien 3 valve from the right transfemoral approach.  He developed atrial fibrillation following his procedure and was placed on IV amiodarone with conversion to sinus rhythm. Follow up visit in our office 03/19/17 and pt noted to have irregular rhythm with intraventricular conduction delay and was felt by our EP team to represent 2:1 conduction of competing fast and slow pathways. Cardiac monitor with sinus rhythm with periods of junctional rhythm, frequent PVCs, frequent PACs and runs of a wide complex tachycardia. ICD was implanted on 05/03/17. Echo September 2020 with LVEF=35-40%. Carotid artery dopplers March 2021 with mld bilateral carotid artery disease. He had trouble with fluid retention and we increased his Lasix and added metolazone 2 days per week. Device interrogation with persistent atrial fibrillation in August 2022 leading to a cardioversion. Echo January 2023 with LVEF=40-45%.  He converted back to atrial fib and was cardioverted to sinus March 2023. He had recurrent atrial flutter in April 2023, was loaded with amiodarone and was cardioverted again in June 2023. He converted back to atrial fib in July 2023 and was paced out  of it in the office. He had evidence of volume overload when seen by Dr. Graciela Husbands in March 2024 and Torsemide was increased. He was restated on amiodarone. Echo March 2024 LVEF=40-45%. AVR working well. He was seen in the Advanced Heart Failure clinic 11/25/22 by Dr. Gasper Lloyd and his volume status was felt to be ok. Continue on daily Torsemide 40 mg and metolazone 2.5 mg twice weekly. He was started on [place] and continued on Toprol, Imdur and aldactone.   He is here today for follow up. The patient denies any chest pain, palpitations, dizziness, near syncope or syncope. He is still having dyspnea at times but is overall happy with his current functional level. He has been taking Torsemide 40 mg every other day and his weight is stable at home. No LE edema or orthopnea.    Primary Care Physician: Nelwyn Salisbury, MD  Past Medical History:  Diagnosis Date   Age-related macular degeneration, dry, both eyes    Allergic    "24/7; 365 days/year; I'm allergic to pollens, dust, all southern grasses/trees, mold, mildue, cats, dogs" (01/07/2017)   Anal fissure    Asthma    sees Dr. Kendrick Fries    Benign prostatic hypertrophy    (sees Dr. Chester Holstein   CAD (coronary artery disease)    a. s/p CABG 2007. b. Cath 08/2016 - 4/4 patent grafts.   Carotid bruit    carotid u/s 10/10: 0.39% bilaterally   Chronic combined systolic and diastolic CHF (congestive heart failure) (HCC)    Complication of anesthesia 1980s   "  w/anal cyst OR, he gave me a saddle block then put a narcotic in spinal cord; had a severe reaction to that" (01/07/2017)   Congestive heart failure (CHF) [facility])    ED (erectile dysfunction)    Family history of adverse reaction to anesthesia    "daughter wakes up during OR" (01/07/2017)   GERD (gastroesophageal reflux disease)    Gout    HTN (hypertension)    Hx of colonic polyps    (sees Dr. Marina Goodell)   Hyperlipidemia    Hypothyroidism    Moderate to severe aortic stenosis    a. s/p TAVR  02/2017.   Myocardial infarction [facility]) ~ 2000   Obesity    Osteoarthritis    "was in my knees, hands" (01/07/2017 )   PAF (paroxysmal atrial fibrillation) (HCC)    a. documented post TAVR.   Precancerous skin lesion    (sees Dr. Terri Piedra)   Prostate cancer [facility]) dx'd ~ 2014   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   29 mm Edwards Sapien 3 transcatheter heart valve placed via percutaneous right transfemoral approach    Past Surgical History:  Procedure Laterality Date   ANUS SURGERY     "opened it back up cause it wouldn't heal; wound up w/a fissure" (01/07/2017)   BIV ICD INSERTION CRT-D N/A 05/02/2017   Procedure: BIV ICD INSERTION CRT-D;  Surgeon: Duke Salvia, MD;  Location: [facility] INVASIVE CV LAB;  Service: Cardiovascular;  Laterality: N/A;   CARDIAC CATHETERIZATION  10/29/2005   CARDIAC CATHETERIZATION N/A 08/27/2016   Procedure: Right/Left Heart Cath and Coronary/Graft Angiography;  Surgeon: Kathleene Hazel, MD;  Location: [facility] INVASIVE CV LAB;  Service: Cardiovascular;  Laterality: N/A;   CARDIOVERSION N/A 04/24/2021   Procedure: CARDIOVERSION;  Surgeon: Pricilla Riffle, MD;  Location: [facility] ENDOSCOPY;  Service: Cardiovascular;  Laterality: N/A;   CARDIOVERSION N/A 11/08/2021   Procedure: CARDIOVERSION;  Surgeon: Chrystie Nose, MD;  Location: [facility] ENDOSCOPY;  Service: Cardiovascular;  Laterality: N/A;   CARDIOVERSION N/A 02/06/2022   Procedure: CARDIOVERSION;  Surgeon: Wendall Stade, MD;  Location: [facility] ENDOSCOPY;  Service: Cardiovascular;  Laterality: N/A;   CATARACT EXTRACTION W/ INTRAOCULAR LENS  IMPLANT, BILATERAL Bilateral    CATARACT EXTRACTION, BILATERAL  2012   COLONOSCOPY  06/30/2008   no repeats needed    COLONOSCOPY     had 3 or 4 in the past    CORONARY ARTERY BYPASS GRAFT  2007   "CABG X4"   CYST EXCISION PERINEAL  1980s   HAMMER TOE SURGERY Bilateral    JOINT REPLACEMENT     KNEE ARTHROPLASTY  07/30/2011   Procedure: COMPUTER  ASSISTED TOTAL KNEE ARTHROPLASTY;  Surgeon: Cammy Copa;  Location: [facility] OR;  Service: Orthopedics;  Laterality: Left;  left total knee arthroplasty   MASTECTOMY SUBCUTANEOUS Bilateral    MULTIPLE TOOTH EXTRACTIONS     ORIF FINGER / THUMB FRACTURE Right ~ 1980   "repair of thumb injury"   PROSTATE BIOPSY     REPLACEMENT TOTAL KNEE BILATERAL Bilateral 2012   TEE WITHOUT CARDIOVERSION N/A 03/04/2017   Procedure: TRANSESOPHAGEAL ECHOCARDIOGRAM (TEE);  Surgeon: Kathleene Hazel, MD;  Location: [facility] OR;  Service: Open Heart Surgery;  Laterality: N/A;   TOTAL KNEE REVISION Right 05/18/2019   Procedure: RIGHT PATELLA REVISION/REMOVAL;  Surgeon: Cammy Copa, MD;  Location: [facility] OR;  Service: Orthopedics;  Laterality: Right;   TRANSCATHETER AORTIC VALVE REPLACEMENT, TRANSFEMORAL N/A 03/04/2017   Procedure:  TRANSCATHETER AORTIC VALVE REPLACEMENT, TRANSFEMORAL;  Surgeon: Kathleene Hazel, MD;  Location: [facility] OR;  Service: Open Heart Surgery;  Laterality: N/A;    Current Outpatient Medications  Medication Sig Dispense Refill   allopurinol (ZYLOPRIM) 100 MG tablet Take 1 tablet (100 mg total) by mouth daily. 90 tablet 3   amiodarone (PACERONE) 200 MG tablet Take 2 tablets by mouth twice daily x 2 weeks then decrease to 2 tablets by mouth daily x 2 weeks 84 tablet 0   aspirin 81 MG tablet Take 1 tablet (81 mg total) by mouth daily. 30 tablet 0   atorvastatin (LIPITOR) 20 MG tablet TAKE ONE TABLET ONCE DAILY 90 tablet 3   benzonatate (TESSALON) 200 MG capsule Take 1 capsule (200 mg total) by mouth 2 (two) times daily as needed for cough. 20 capsule 0   cetirizine (ZYRTEC) 10 MG tablet Take 10 mg by mouth 2 (two) times daily. [facility]     colchicine 0.6 MG tablet Take 1 tablet (0.6 mg total) by mouth every 6 (six) hours as needed (gout). 60 tablet 5   dabigatran (PRADAXA) 150 MG CAPS capsule TAKE 1 CAPSULE BY MOUTH TWICE  DAILY 180 capsule 3   dapagliflozin propanediol (FARXIGA) 10  MG TABS tablet Take 1 tablet (10 mg total) by mouth daily before breakfast. 30 tablet 5   diphenhydrAMINE (BENADRYL) 25 MG tablet Take 25 mg by mouth 2 (two) times daily.     EPINEPHrine 0.3 mg/0.3 mL IJ SOAJ injection Inject 0.3 mLs (0.3 mg total) into the muscle as needed for anaphylaxis. 2 Device 1   fluorouracil (EFUDEX) 5 % cream Apply 1 application  topically 2 (two) times daily as needed (rash).     isosorbide mononitrate (IMDUR) 30 MG 24 hr tablet TAKE ONE TABLET ONCE DAILY 90 tablet 0   ketotifen (ZADITOR) 0.025 % ophthalmic solution Apply 1 drop to eye daily as needed (itchy eyes).      levothyroxine (SYNTHROID) 200 MCG tablet Take 1 tablet (200 mcg total) by mouth daily. 90 tablet 3   methocarbamol (ROBAXIN) 500 MG tablet Take 1 tablet (500 mg total) by mouth 4 (four) times daily. (Patient taking differently: Take 500 mg by mouth 4 (four) times daily as needed.) 40 tablet 0   metoCLOPramide (REGLAN) 10 MG tablet TAKE ONE TABLET BY MOUTH EVERY MORNING WITH BREAKFAST 30 tablet 3   metolazone (ZAROXOLYN) 2.5 MG tablet Take 1 tablet (2.5 mg total) by mouth 2 (two) times a week. Wednesday and Saturday 24 tablet 3   metoprolol succinate (TOPROL-XL) 50 MG 24 hr tablet Take 75 mg by mouth daily. Take with or immediately following a meal.     montelukast (SINGULAIR) 10 MG tablet TAKE ONE TABLET BY MOUTH ONCE DAILY IN THE EVENING 90 tablet 3   Multiple Vitamins-Minerals (PRESERVISION AREDS PO) Take 2 tablets by mouth every morning.     omeprazole (PRILOSEC) 20 MG capsule TAKE ONE CAPSULE TWICE DAILY 180 capsule 0   potassium chloride SA (KLOR-CON M) 20 MEQ tablet Take 1 tablet (20 mEq total) by mouth 2 (two) times daily. 180 tablet 3   spironolactone (ALDACTONE) 25 MG tablet Take 1 tablet (25 mg total) by mouth daily. 90 tablet 3   torsemide (DEMADEX) 20 MG tablet Take 2 tablets (40 mg total) by mouth every other day. 30 tablet 15   triamcinolone cream (KENALOG) 0.1 % Apply 1 application  topically  daily as needed for dry skin.     valACYclovir (VALTREX)  500 MG tablet Take 500 mg by mouth 2 (two) times daily as needed (cold sores).     No current facility-administered medications for this visit.    Allergies  Allergen Reactions   Peanut-Containing Drug Products Anaphylaxis   Sulfonamide Derivatives Anaphylaxis   Amlodipine Swelling    Swelling in ankles   Eliquis [Apixaban] Other (See Comments)    Back/hip pain   Lisinopril Cough    cough   Xarelto [Rivaroxaban] Other (See Comments)    Back/hip pain    Social History   Socioeconomic History   Marital status: Married    Spouse name: Not on file   Number of children: 3   Years of education: Not on file   Highest education level: Not on file  Occupational History   Occupation: Product/process development scientist    Comment: builds malls  Tobacco Use   Smoking status: Former    Packs/day: 3.50    Years: 13.00    Additional pack years: 0.00    Total pack years: 45.50    Types: Cigarettes    Quit date: 1963    Years since quitting: 61.4   Smokeless tobacco: Never   Tobacco comments:    Former smoker 04/19/21  Vaping Use   Vaping Use: Never used  Substance and Sexual Activity   Alcohol use: Not Currently    Alcohol/week: 7.0 standard drinks of alcohol    Types: 7 Cans of beer per week    Comment: 1 beer daily after 5pm (04/19/21)   Drug use: No   Sexual activity: Never  Other Topics Concern   Not on file  Social History Narrative   FH of CAD, Male 1st degree relative less than age 43.   Social Determinants of Health   Financial Resource Strain: Medium Risk (07/31/2022)   Overall Financial Resource Strain (CARDIA)    Difficulty of Paying Living Expenses: Somewhat hard  Food Insecurity: No Food Insecurity (03/25/2022)   Hunger Vital Sign    Worried About Running Out of Food in the Last Year: Never true    Ran Out of Food in the Last Year: Never true  Transportation Needs: No Transportation Needs (03/25/2022)   PRAPARE -  Administrator, Civil Service (Medical): No    Lack of Transportation (Non-Medical): No  Physical Activity: Inactive (03/25/2022)   Exercise Vital Sign    Days of Exercise per Week: 0 days    Minutes of Exercise per Session: 0 min  Stress: No Stress Concern Present (03/25/2022)   [employer] of Occupational Health - Occupational Stress Questionnaire    Feeling of Stress : Not at all  Social Connections: Moderately Isolated (03/22/2021)   Social Connection and Isolation Panel [NHANES]    Frequency of Communication with Friends and Family: Three times a week    Frequency of Social Gatherings with Friends and Family: Three times a week    Attends Religious Services: Never    Active Member of Clubs or Organizations: No    Attends Banker Meetings: Never    Marital Status: Married  Catering manager Violence: Not At Risk (03/22/2021)   Humiliation, Afraid, Rape, and Kick questionnaire    Fear of Current or Ex-Partner: No    Emotionally Abused: No    Physically Abused: No    Sexually Abused: No    Family History  Problem Relation Age of Onset   Heart attack Father 59   Allergic rhinitis Father    Asthma Father  Heart failure Mother 32   Uterine cancer Mother    Breast cancer Mother    Colon cancer Neg Hx    Esophageal cancer Neg Hx     Review of Systems:  As stated in the HPI and otherwise negative.   BP 112/70   Pulse 93   Ht 5' 6.5" (1.689 m)   Wt 120.7 kg   SpO2 97%   BMI 42.32 kg/m   Physical Examination: General: Well developed, well nourished, NAD  HEENT: OP clear, mucus membranes moist  SKIN: warm, dry. No rashes. Neuro: No focal deficits  Musculoskeletal: Muscle strength 5/5 all ext  Psychiatric: Mood and affect normal  Neck: No JVD, no carotid bruits, no thyromegaly, no lymphadenopathy.  Lungs:Clear bilaterally, no wheezes, rhonci, crackles Cardiovascular: Regular rate and rhythm. Soft systolic murmur.  Abdomen:Soft. Bowel sounds  present. Non-tender.  Extremities: No lower extremity edema. Pulses are 2 + in the bilateral DP/PT.  Echo March 2024:  1. Windows are challenging. Left ventricular ejection fraction, by  estimation, is 40 to 45%. The left ventricle has mildly decreased  function. The left ventricle demonstrates global hypokinesis. There is  moderate concentric left ventricular hypertrophy.   Left ventricular diastolic parameters are indeterminate.   2. Right ventricular systolic function is mildly reduced. The right  ventricular size is mildly enlarged. There is normal pulmonary artery  systolic pressure.   3. No evidence of mitral valve regurgitation.   4. S/p TAVR 29 mm Edwards S3. No significant PVL. AV max 1.5 m/s. Normal  prosthesis. Aortic valve regurgitation is not visualized. Aortic valve  area, by VTI measures 2.88 cm.   5. Aneurysm of the ascending aorta, measuring 41 mm.   6. The inferior vena cava is normal in size with greater than 50%  respiratory variability, suggesting right atrial pressure of 3 mmHg.   EKG:  EKG is not ordered today The ekg ordered today demonstrates    Recent Labs: 10/10/2022: Hemoglobin 13.2; Platelets 228.0 10/16/2022: NT-Pro BNP 2,293 11/20/2022: ALT 18; TSH 16.200 12/04/2022: B Natriuretic Peptide 398.3; BUN 26; Creatinine, Ser 1.68; Potassium 4.6; Sodium 140   Lipid Panel    Component Value Date/Time   CHOL 141 10/10/2022 1143   CHOL 171 08/11/2018 0735   TRIG 148.0 10/10/2022 1143   TRIG 143 06/30/2006 1132   HDL 39.70 10/10/2022 1143   HDL 34 (L) 08/11/2018 0735   CHOLHDL 4 10/10/2022 1143   VLDL 29.6 10/10/2022 1143   LDLCALC 72 10/10/2022 1143   LDLCALC 68 03/21/2020 0943     Wt Readings from Last 3 Encounters:  01/13/23 120.7 kg  11/25/22 124.7 kg  11/13/22 127.3 kg    Assessment and Plan:   1. CAD s/p CABG without angina: No chest pain suggestive of angina. Continue ASA, statin, beta blocker and Imdur.         2. Aortic valve stenosis: He  is s/p TAVR in July 2018. AVR working well by echo in March 2024. No PVL. He will continue SBE prophylaxis as indicated.    3. HTN:BP is well controlled. No changes today  4. HLD: LDL near goal in February 5784. Continue statin   5. Acute on Chronic systolic and diastolic CHF/Ischemic cardiomyopathy:  Weight is stable. No volume overload on exam. Will continue Torsemide 40 mg every other day and Metolazone 2 days per week.  Continue Farxiga, aldactone and Toprol. Will likely be started on Entresto this week when he is seen back in the Advanced heart Failure  clinic.   6. Atrial fibrillation, persistent: Sinus today on exam. Continue amiodarone, Toprol and Pradaxa.   7. Carotid artery disease:  Mild bilateral carotid artery disease by dopplers March 2021.   Labs/ tests ordered today include:  No orders of the defined types were placed in this encounter.  Disposition:  Follow up with me in 6 months.   Signed, Verne Carrow, MD 01/13/2023 12:14 PM    [facility] [address] [place], [place], [place]  [address] Phone: [phone]; Fax: [phone]

## 2023-01-13 NOTE — Patient Instructions (Signed)
Medication Instructions:  No changes *If you need a refill on your cardiac medications before your next appointment, please call your pharmacy*   Lab Work: none If you have labs (blood work) drawn today and your tests are completely normal, you will receive your results only by: MyChart Message (if you have MyChart) OR A paper copy in the mail If you have any lab test that is abnormal or we need to change your treatment, we will call you to review the results.   Testing/Procedures: none   Follow-Up: At [facility], you and your health needs are our priority.  As part of our continuing mission to provide you with exceptional heart care, we have created designated Provider Care Teams.  These Care Teams include your primary Cardiologist (physician) and Advanced Practice Providers (APPs -  Physician Assistants and Nurse Practitioners) who all work together to provide you with the care you need, when you need it.   Your next appointment:   6 month(s)  Provider:   Christopher McAlhany, MD      

## 2023-01-15 DIAGNOSIS — H353221 Exudative age-related macular degeneration, left eye, with active choroidal neovascularization: Secondary | ICD-10-CM | POA: Diagnosis not present

## 2023-01-23 ENCOUNTER — Other Ambulatory Visit: Payer: Self-pay | Admitting: Family Medicine

## 2023-01-23 NOTE — Progress Notes (Signed)
Remote ICD transmission.   

## 2023-02-10 ENCOUNTER — Ambulatory Visit: Payer: Medicare Other | Attending: Internal Medicine

## 2023-02-10 ENCOUNTER — Ambulatory Visit: Payer: Medicare Other

## 2023-02-10 DIAGNOSIS — I255 Ischemic cardiomyopathy: Secondary | ICD-10-CM

## 2023-02-10 DIAGNOSIS — I495 Sick sinus syndrome: Secondary | ICD-10-CM

## 2023-02-10 DIAGNOSIS — I5042 Chronic combined systolic (congestive) and diastolic (congestive) heart failure: Secondary | ICD-10-CM | POA: Diagnosis not present

## 2023-02-10 DIAGNOSIS — Z95810 Presence of automatic (implantable) cardiac defibrillator: Secondary | ICD-10-CM

## 2023-02-11 LAB — CUP PACEART REMOTE DEVICE CHECK
Battery Remaining Longevity: 2 mo
Battery Voltage: 2.76 V
Brady Statistic RA Percent Paced: 0.57 %
Brady Statistic RV Percent Paced: 78.92 %
Date Time Interrogation Session: 20240617061607
HighPow Impedance: 93 Ohm
Implantable Lead Connection Status: 753985
Implantable Lead Connection Status: 753985
Implantable Lead Connection Status: 753985
Implantable Lead Implant Date: 20180907
Implantable Lead Implant Date: 20180907
Implantable Lead Implant Date: 20180907
Implantable Lead Location: 753858
Implantable Lead Location: 753859
Implantable Lead Location: 753860
Implantable Lead Model: 4396
Implantable Lead Model: 5076
Implantable Pulse Generator Implant Date: 20180907
Lead Channel Impedance Value: 1102 Ohm
Lead Channel Impedance Value: 342 Ohm
Lead Channel Impedance Value: 380 Ohm
Lead Channel Impedance Value: 437 Ohm
Lead Channel Impedance Value: 589 Ohm
Lead Channel Impedance Value: 703 Ohm
Lead Channel Pacing Threshold Amplitude: 0.875 V
Lead Channel Pacing Threshold Amplitude: 1 V
Lead Channel Pacing Threshold Amplitude: 1.625 V
Lead Channel Pacing Threshold Pulse Width: 0.4 ms
Lead Channel Pacing Threshold Pulse Width: 0.4 ms
Lead Channel Pacing Threshold Pulse Width: 1 ms
Lead Channel Sensing Intrinsic Amplitude: 1.125 mV
Lead Channel Sensing Intrinsic Amplitude: 1.125 mV
Lead Channel Sensing Intrinsic Amplitude: 11.375 mV
Lead Channel Sensing Intrinsic Amplitude: 11.375 mV
Lead Channel Setting Pacing Amplitude: 1.75 V
Lead Channel Setting Pacing Amplitude: 2.25 V
Lead Channel Setting Pacing Amplitude: 2.5 V
Lead Channel Setting Pacing Pulse Width: 0.4 ms
Lead Channel Setting Pacing Pulse Width: 1 ms
Lead Channel Setting Sensing Sensitivity: 0.3 mV
Zone Setting Status: 755011

## 2023-02-12 NOTE — Progress Notes (Signed)
EPIC Encounter for ICM Monitoring  Patient Name: Justin [name] is a 89 y.o. male Date: 02/12/2023 Primary Care Physican: Nelwyn Salisbury, MD Primary Cardiologist: McAlhany/Sabharwal Electrophysiologist: Joycelyn Schmid Pacing: 85.9% Effective 83%  01/08/2022 Weight: 260 lbs 06/17/2022 Weight: 272 lbs (baseline 269 lbs) 08/13/2022 Weight: 272 lbs 09/27/2022 Weight: 270 lbs 02/12/2023 Weight: 261 lbs   Battery Longevity: 2 months   Since 13-Jan-2023 AT/AF  1 Time in AT/AF  24.0 hr/day (100.0%) Longest AT/AF  88 days   Spoke with patient and heart failure questions reviewed.  Transmission results reviewed.  Pt asymptomatic for fluid accumulation.  Reports feeling well at this time and voices no complaints.      Diet:  Not strict with limiting salt.   Optivol thoracic impedance normal but was suggesting possible fluid from 6/11 and returned to normal 6/11.     Prescribed: Torsemide 20 mg take 2 tablets (40 mg total) every other day.   Potassium 20 mEq take 1 tablet by mouth twice a day Metolazone 2.5 mg Take 1 tablet by mouth twice a week Wednesday and Saturday (increased on 3/29). Spironolactone 25 mg take 1 tablet daily   Labs: 12/04/2022 Creatinine 1.68, BUN 26, Potassium 4.6, Sodium 140, GFR 39 11/20/2022 Creatinine 1.39, BUN 28, Potassium 3.2, Sodium 139, GFR 49  10/31/2022 Creatinine 1.81, BUN 44, Potassium 4.3, Sodium 132, GFR 36  10/22/2022 Creatinine 2.37, BUN 65, Potassium 4.2, Sodium 135  10/16/2022 Creatinine 1.54, BUN 31, Potassium 5.6, Sodium 134  10/10/2022 Creatinine 1.65, BUN 36, Potassium 4.6, Sodium 136, GFR 37.42  04/01/2022 Creatinine 1.58, BUN 36, Potassium 4.5, Sodium 135, GFR 43 02/19/2022 Creatinine 1.43, BUN 33, Potassium 4.9, Sodium 134, GFR 48  A complete set of results can be found in Results Review.   Recommendations: Recommendation to limit salt intake to 2000 mg daily and fluid intake to 64 oz daily.  Encouraged to call if experiencing any fluid  symptoms.    Follow-up plan: ICM clinic phone appointment on 03/17/2023.   91 day device clinic remote transmission 04/14/2023.     EP/Cardiology Office Visits: 02/17/2023 with Otilio Saber, PA.   01/13/2023 with Dr Clifton James.     Copy of ICM check sent to Dr. Graciela Husbands.    3 month ICM trend: 02/10/2023.    12-14 Month ICM trend:     Karie Soda, RN 02/12/2023 4:40 PM

## 2023-02-15 NOTE — Progress Notes (Unsigned)
  Electrophysiology Office Note:   ID:  Jishnu, Jenniges 1934-07-31, MRN [id number]  Primary Cardiologist: Verne Carrow, MD Electrophysiologist: Sherryl Manges, MD  {Click to update primary MD,subspecialty MD or APP then REFRESH:1}    History of Present Illness:   Justin [name] is a 89 y.o. male with h/o VT, SND s/p PPM, Persistent AF/AFL, s/p TAVR, obesity, PVCs, and chronic systolic CHF seen today for routine electrophysiology followup.  Since last being seen in our clinic the patient reports doing ***.  he denies chest pain, palpitations, dyspnea, PND, orthopnea, nausea, vomiting, dizziness, syncope, edema, weight gain, or early satiety.   Review of systems complete and found to be negative unless listed in HPI.   Device History: Medtronic BiV ICD implanted 04/2017 for syncope and new LBBB following TAVR History of appropriate therapy: No History of AAD therapy: No   Studies Reviewed:    ICD Interrogation-  reviewed in detail today,  See PACEART report.  {EKGtoday:28818}       Risk Assessment/Calculations:   {Does this patient have ATRIAL FIBRILLATION?:[phone]} No BP recorded.  {Refresh Note OR Click here to enter BP  :1}***        Physical Exam:   VS:  There were no vitals taken for this visit.   Wt Readings from Last 3 Encounters:  01/13/23 266 lb 3.2 oz (120.7 kg)  11/25/22 275 lb (124.7 kg)  11/13/22 280 lb 9.6 oz (127.3 kg)     GEN: Well nourished, well developed in no acute distress NECK: No JVD; No carotid bruits CARDIAC: {EPRHYTHM:28826}, no murmurs, rubs, gallops RESPIRATORY:  Clear to auscultation without rales, wheezing or rhonchi  ABDOMEN: Soft, non-tender, non-distended EXTREMITIES:  No edema; No deformity   ASSESSMENT AND PLAN:    Chronic systolic dysfunction s/p Medtronic CRT-D  Echo 11/12/2022 LVEF 40-45% euvolemic today Stable on an appropriate medical regimen Normal ICD function See Pace Art report No changes today  AKI on  CKD III Labs today on torsemide and metolazone ***  Permanent atrial fibrillation H/o AFL / AT Continue pradaxa  AS s/p TAVR Stable by echo 11/12/2022   CAD s/p CABG 2007 Denies s/s ischemia   Disposition:   Follow up with {EPPROVIDERS:28135} {EPFOLLOW UP:28173}   Signed, Graciella Freer, PA-C

## 2023-02-17 ENCOUNTER — Encounter: Payer: Self-pay | Admitting: Student

## 2023-02-17 ENCOUNTER — Ambulatory Visit: Payer: Medicare Other | Attending: Student | Admitting: Student

## 2023-02-17 VITALS — BP 102/64 | HR 90 | Ht 66.0 in | Wt 262.4 lb

## 2023-02-17 DIAGNOSIS — Z95810 Presence of automatic (implantable) cardiac defibrillator: Secondary | ICD-10-CM | POA: Diagnosis not present

## 2023-02-17 DIAGNOSIS — Z952 Presence of prosthetic heart valve: Secondary | ICD-10-CM

## 2023-02-17 DIAGNOSIS — I5042 Chronic combined systolic (congestive) and diastolic (congestive) heart failure: Secondary | ICD-10-CM

## 2023-02-17 DIAGNOSIS — I4819 Other persistent atrial fibrillation: Secondary | ICD-10-CM

## 2023-02-17 DIAGNOSIS — I2510 Atherosclerotic heart disease of native coronary artery without angina pectoris: Secondary | ICD-10-CM | POA: Diagnosis not present

## 2023-02-17 LAB — CUP PACEART INCLINIC DEVICE CHECK
Battery Remaining Longevity: 2 mo
Battery Voltage: 2.75 V
Brady Statistic RA Percent Paced: 0.75 %
Brady Statistic RV Percent Paced: 82.91 %
Date Time Interrogation Session: 20240624125731
HighPow Impedance: 104 Ohm
Implantable Lead Connection Status: 753985
Implantable Lead Connection Status: 753985
Implantable Lead Connection Status: 753985
Implantable Lead Implant Date: 20180907
Implantable Lead Implant Date: 20180907
Implantable Lead Implant Date: 20180907
Implantable Lead Location: 753858
Implantable Lead Location: 753859
Implantable Lead Location: 753860
Implantable Lead Model: 4396
Implantable Lead Model: 5076
Implantable Pulse Generator Implant Date: 20180907
Lead Channel Impedance Value: 1235 Ohm
Lead Channel Impedance Value: 399 Ohm
Lead Channel Impedance Value: 399 Ohm
Lead Channel Impedance Value: 494 Ohm
Lead Channel Impedance Value: 646 Ohm
Lead Channel Impedance Value: 817 Ohm
Lead Channel Pacing Threshold Amplitude: 0.875 V
Lead Channel Pacing Threshold Amplitude: 1.125 V
Lead Channel Pacing Threshold Amplitude: 1.75 V
Lead Channel Pacing Threshold Pulse Width: 0.4 ms
Lead Channel Pacing Threshold Pulse Width: 0.4 ms
Lead Channel Pacing Threshold Pulse Width: 1 ms
Lead Channel Sensing Intrinsic Amplitude: 0.625 mV
Lead Channel Sensing Intrinsic Amplitude: 1.25 mV
Lead Channel Sensing Intrinsic Amplitude: 10.125 mV
Lead Channel Sensing Intrinsic Amplitude: 14.25 mV
Lead Channel Setting Pacing Amplitude: 1.75 V
Lead Channel Setting Pacing Amplitude: 2.25 V
Lead Channel Setting Pacing Amplitude: 2.5 V
Lead Channel Setting Pacing Pulse Width: 0.4 ms
Lead Channel Setting Pacing Pulse Width: 1 ms
Lead Channel Setting Sensing Sensitivity: 0.3 mV
Zone Setting Status: 755011

## 2023-02-17 NOTE — Patient Instructions (Signed)
Medication Instructions:  Your physician recommends that you continue on your current medications as directed. Please refer to the Current Medication list given to you today.  *If you need a refill on your cardiac medications before your next appointment, please call your pharmacy*  Lab Work: CMET, TSH, FreeT4-TODAY If you have labs (blood work) drawn today and your tests are completely normal, you will receive your results only by: MyChart Message (if you have MyChart) OR A paper copy in the mail If you have any lab test that is abnormal or we need to change your treatment, we will call you to review the results.  Follow-Up: At [facility], you and your health needs are our priority.  As part of our continuing mission to provide you with exceptional heart care, we have created designated Provider Care Teams.  These Care Teams include your primary Cardiologist (physician) and Advanced Practice Providers (APPs -  Physician Assistants and Nurse Practitioners) who all work together to provide you with the care you need, when you need it.  Your next appointment:   3 month(s)  Provider:   Sherryl Manges, MD to discuss gen change

## 2023-02-18 ENCOUNTER — Telehealth: Payer: Self-pay

## 2023-02-18 DIAGNOSIS — I2510 Atherosclerotic heart disease of native coronary artery without angina pectoris: Secondary | ICD-10-CM

## 2023-02-18 DIAGNOSIS — I5042 Chronic combined systolic (congestive) and diastolic (congestive) heart failure: Secondary | ICD-10-CM

## 2023-02-18 LAB — COMPREHENSIVE METABOLIC PANEL
ALT: 18 IU/L (ref 0–44)
AST: 27 IU/L (ref 0–40)
Albumin: 3.8 g/dL (ref 3.7–4.7)
Alkaline Phosphatase: 119 IU/L (ref 44–121)
BUN/Creatinine Ratio: 22 (ref 10–24)
BUN: 42 mg/dL — ABNORMAL HIGH (ref 8–27)
Bilirubin Total: 0.6 mg/dL (ref 0.0–1.2)
CO2: 26 mmol/L (ref 20–29)
Calcium: 9.2 mg/dL (ref 8.6–10.2)
Chloride: 91 mmol/L — ABNORMAL LOW (ref 96–106)
Creatinine, Ser: 1.94 mg/dL — ABNORMAL HIGH (ref 0.76–1.27)
Globulin, Total: 3.5 g/dL (ref 1.5–4.5)
Glucose: 126 mg/dL — ABNORMAL HIGH (ref 70–99)
Potassium: 3.4 mmol/L — ABNORMAL LOW (ref 3.5–5.2)
Sodium: 136 mmol/L (ref 134–144)
Total Protein: 7.3 g/dL (ref 6.0–8.5)
eGFR: 33 mL/min/{1.73_m2} — ABNORMAL LOW (ref >59)

## 2023-02-18 LAB — T4, FREE: Free T4: 1.97 ng/dL — ABNORMAL HIGH (ref 0.82–1.77)

## 2023-02-18 LAB — TSH: TSH: 1.66 u[IU]/mL (ref 0.450–4.500)

## 2023-02-18 MED ORDER — POTASSIUM CHLORIDE CRYS ER 20 MEQ PO TBCR: 20.0000 meq | EXTENDED_RELEASE_TABLET | Freq: Two times a day (BID) | ORAL | 3 refills | Status: DC

## 2023-02-18 MED ORDER — METOLAZONE 2.5 MG PO TABS: 2.5000 mg | ORAL_TABLET | ORAL | 3 refills | Status: DC

## 2023-02-18 MED ORDER — TORSEMIDE 20 MG PO TABS: ORAL_TABLET | ORAL | 3 refills | Status: DC

## 2023-02-18 NOTE — Telephone Encounter (Signed)
  Graciella Freer, PA-C 02/18/2023  3:08 PM EDT Back to Top    Apologies, I left off he'll need repeat next week with the change. Would do early/Monday if possible given mild AKI and hypokalemia.   Patient aware, labs scheduled.

## 2023-02-18 NOTE — Addendum Note (Signed)
Addended by: Frutoso Schatz on: 02/18/2023 03:21 PM   Modules accepted: Orders

## 2023-02-18 NOTE — Telephone Encounter (Signed)
-----   Message from Graciella Freer, PA-C sent at 02/18/2023  1:46 PM EDT ----- AKI noted but diuretic regimen had been quite complicated with every other day torsemide and metolazone.   Please have him take an extra 40 meq of potassium today.   Would have him SKIP his metolazone tomorrow and change it to once weekly (Saturdays).    Would change torsemide to alternating daily doses of 40 mg and 20 mg (40 mg one day, 20 the next, 40 the following, and so on...)

## 2023-02-18 NOTE — Telephone Encounter (Signed)
The patient has been notified of the result and verbalized understanding.  All questions (if any) were answered. Frutoso Schatz, RN 02/18/2023 2:02 PM

## 2023-02-24 ENCOUNTER — Ambulatory Visit: Payer: Medicare Other

## 2023-02-25 ENCOUNTER — Ambulatory Visit: Payer: Medicare Other | Attending: Student

## 2023-02-25 DIAGNOSIS — I5042 Chronic combined systolic (congestive) and diastolic (congestive) heart failure: Secondary | ICD-10-CM | POA: Diagnosis not present

## 2023-02-25 DIAGNOSIS — I2510 Atherosclerotic heart disease of native coronary artery without angina pectoris: Secondary | ICD-10-CM | POA: Diagnosis not present

## 2023-02-26 ENCOUNTER — Other Ambulatory Visit: Payer: Self-pay | Admitting: *Deleted

## 2023-02-26 DIAGNOSIS — Z79899 Other long term (current) drug therapy: Secondary | ICD-10-CM

## 2023-02-26 LAB — BASIC METABOLIC PANEL
BUN/Creatinine Ratio: 19 (ref 10–24)
BUN: 38 mg/dL — ABNORMAL HIGH (ref 8–27)
CO2: 27 mmol/L (ref 20–29)
Calcium: 8.8 mg/dL (ref 8.6–10.2)
Chloride: 87 mmol/L — ABNORMAL LOW (ref 96–106)
Creatinine, Ser: 1.99 mg/dL — ABNORMAL HIGH (ref 0.76–1.27)
Glucose: 112 mg/dL — ABNORMAL HIGH (ref 70–99)
Potassium: 3.4 mmol/L — ABNORMAL LOW (ref 3.5–5.2)
Sodium: 134 mmol/L (ref 134–144)
eGFR: 32 mL/min/{1.73_m2} — ABNORMAL LOW (ref >59)

## 2023-03-03 NOTE — Addendum Note (Signed)
Addended by: Geralyn Flash D on: 03/03/2023 11:20 AM   Modules accepted: Level of Service

## 2023-03-03 NOTE — Progress Notes (Signed)
Remote ICD transmission.   

## 2023-03-07 ENCOUNTER — Ambulatory Visit: Payer: Medicare Other | Attending: Internal Medicine

## 2023-03-07 DIAGNOSIS — Z79899 Other long term (current) drug therapy: Secondary | ICD-10-CM

## 2023-03-08 LAB — BASIC METABOLIC PANEL
BUN/Creatinine Ratio: 19 (ref 10–24)
BUN: 28 mg/dL — ABNORMAL HIGH (ref 8–27)
CO2: 26 mmol/L (ref 20–29)
Calcium: 8.7 mg/dL (ref 8.6–10.2)
Chloride: 95 mmol/L — ABNORMAL LOW (ref 96–106)
Creatinine, Ser: 1.47 mg/dL — ABNORMAL HIGH (ref 0.76–1.27)
Glucose: 111 mg/dL — ABNORMAL HIGH (ref 70–99)
Potassium: 3.7 mmol/L (ref 3.5–5.2)
Sodium: 140 mmol/L (ref 134–144)
eGFR: 46 mL/min/{1.73_m2} — ABNORMAL LOW (ref >59)

## 2023-03-10 ENCOUNTER — Telehealth: Payer: Self-pay | Admitting: Student

## 2023-03-10 NOTE — Telephone Encounter (Signed)
Reviewed labs with patient. Per Otilio Saber, PA-C: Renal function has improved and Potassium has as well.   I think we can leave him on his current regiment with once weekly metolazone , as long as his symptoms and fluid status are OK.     He does need to take an extra 40 meq of potassium on his metolazone days ( He should take this amount today, Monday, if he didn't take it with his dose over the weekend)   Patient states he took his metolazone yesterday, he verbalized understanding of the above and expressed appreciation for call.

## 2023-03-10 NOTE — Telephone Encounter (Signed)
Follow Up:     Patient is returning call about her lab results.n

## 2023-03-12 LAB — CUP PACEART REMOTE DEVICE CHECK
Battery Remaining Longevity: 1 mo
Battery Voltage: 2.73 V
Brady Statistic RA Percent Paced: 0.64 %
Brady Statistic RV Percent Paced: 74.71 %
Date Time Interrogation Session: 20240717063427
HighPow Impedance: 84 Ohm
Implantable Lead Connection Status: 753985
Implantable Lead Connection Status: 753985
Implantable Lead Connection Status: 753985
Implantable Lead Implant Date: 20180907
Implantable Lead Implant Date: 20180907
Implantable Lead Implant Date: 20180907
Implantable Lead Location: 753858
Implantable Lead Location: 753859
Implantable Lead Location: 753860
Implantable Lead Model: 4396
Implantable Lead Model: 5076
Implantable Pulse Generator Implant Date: 20180907
Lead Channel Impedance Value: 1026 Ohm
Lead Channel Impedance Value: 323 Ohm
Lead Channel Impedance Value: 342 Ohm
Lead Channel Impedance Value: 380 Ohm
Lead Channel Impedance Value: 532 Ohm
Lead Channel Impedance Value: 646 Ohm
Lead Channel Pacing Threshold Amplitude: 0.875 V
Lead Channel Pacing Threshold Amplitude: 1 V
Lead Channel Pacing Threshold Amplitude: 1.5 V
Lead Channel Pacing Threshold Pulse Width: 0.4 ms
Lead Channel Pacing Threshold Pulse Width: 0.4 ms
Lead Channel Pacing Threshold Pulse Width: 1 ms
Lead Channel Sensing Intrinsic Amplitude: 0.875 mV
Lead Channel Sensing Intrinsic Amplitude: 0.875 mV
Lead Channel Sensing Intrinsic Amplitude: 10.125 mV
Lead Channel Sensing Intrinsic Amplitude: 10.125 mV
Lead Channel Setting Pacing Amplitude: 1.75 V
Lead Channel Setting Pacing Amplitude: 2.25 V
Lead Channel Setting Pacing Amplitude: 2.5 V
Lead Channel Setting Pacing Pulse Width: 0.4 ms
Lead Channel Setting Pacing Pulse Width: 1 ms
Lead Channel Setting Sensing Sensitivity: 0.3 mV
Zone Setting Status: 755011

## 2023-03-13 ENCOUNTER — Ambulatory Visit (INDEPENDENT_AMBULATORY_CARE_PROVIDER_SITE_OTHER): Payer: Medicare Other

## 2023-03-13 DIAGNOSIS — I255 Ischemic cardiomyopathy: Secondary | ICD-10-CM

## 2023-03-17 ENCOUNTER — Ambulatory Visit: Payer: Medicare Other | Attending: Internal Medicine

## 2023-03-17 ENCOUNTER — Other Ambulatory Visit: Payer: Self-pay | Admitting: Family Medicine

## 2023-03-17 DIAGNOSIS — K219 Gastro-esophageal reflux disease without esophagitis: Secondary | ICD-10-CM

## 2023-03-17 DIAGNOSIS — Z95810 Presence of automatic (implantable) cardiac defibrillator: Secondary | ICD-10-CM

## 2023-03-17 DIAGNOSIS — I5042 Chronic combined systolic (congestive) and diastolic (congestive) heart failure: Secondary | ICD-10-CM

## 2023-03-18 ENCOUNTER — Telehealth: Payer: Self-pay

## 2023-03-18 NOTE — Telephone Encounter (Signed)
Remote ICM transmission received.  Attempted call to patient regarding ICM remote transmission and left detailed message per DPR.  Left ICM phone number and advised to return call for any fluid symptoms or questions. Next ICM remote transmission scheduled 04/21/2023.

## 2023-03-18 NOTE — Progress Notes (Signed)
EPIC Encounter for ICM Monitoring  Patient Name: Justin [name] is a 89 y.o. male Date: 03/18/2023 Primary Care Physican: Nelwyn Salisbury, MD Primary Cardiologist: McAlhany/Sabharwal Electrophysiologist: Joycelyn Schmid Pacing: 77.1% Effective 74.1%  01/08/2022 Weight: 260 lbs 06/17/2022 Weight: 272 lbs (baseline 269 lbs) 08/13/2022 Weight: 272 lbs 09/27/2022 Weight: 270 lbs 02/12/2023 Weight: 261 lbs   Battery Longevity: 1 month   Since 12-Mar-2023 AT/AF  1 Time in AT/AF  24.0 hr/day (100.0%) Longest AT/AF  4 months   Attempted call to patient and unable to reach.  Left detailed message per DPR regarding transmission. Transmission reviewed.     Diet:  Not strict with limiting salt.    Optivol thoracic impedance suggesting possible fluid starting 7/9 and returning close to baseline 7/22.     Prescribed: Torsemide 20 mg take 2 tablets (40 mg total) every other day alternating with 1 tablet (20 mg total) every other day.   Potassium 20 mEq take 1 tablet by mouth twice a day Metolazone 2.5 mg Take 1 tablet by mouth once a week on Saturday (per 7/15 phone note should take extra 40 mEq of Potassium with Metolazone). Spironolactone 25 mg take 1 tablet daily   Labs: 03/07/2023 Creatinine 1.47, BUN 28, Potassium 3.7, Sodium 140, GFR 46  02/25/2023 Creatinine 1.99, BUN 38, Potassium 3.4, Sodium 134, GFR 32  02/17/2023 Creatinine 1.94, BUN 42, Potassium 3.4, Sodium 136, GFR 33  12/04/2022 Creatinine 1.68, BUN 26, Potassium 4.6, Sodium 140, GFR 39 11/20/2022 Creatinine 1.39, BUN 28, Potassium 3.2, Sodium 139, GFR 49  10/31/2022 Creatinine 1.81, BUN 44, Potassium 4.3, Sodium 132, GFR 36  A complete set of results can be found in Results Review.   Recommendations:   Left voice mail with ICM number and encouraged to call if experiencing any fluid symptoms.   Follow-up plan: ICM clinic phone appointment on 04/21/2023.   91 day device clinic remote transmission 04/14/2023.     EP/Cardiology Office  Visits: 05/26/2023 with Dr Graciela Husbands.    Copy of ICM check sent to Dr. Graciela Husbands.    3 month ICM trend: 03/17/2023.    12-14 Month ICM trend:     Karie Soda, RN 03/18/2023 12:28 PM

## 2023-03-20 ENCOUNTER — Telehealth: Payer: Self-pay

## 2023-03-20 DIAGNOSIS — H353221 Exudative age-related macular degeneration, left eye, with active choroidal neovascularization: Secondary | ICD-10-CM | POA: Diagnosis not present

## 2023-03-20 NOTE — Telephone Encounter (Signed)
Pt has been scheduled for 04/18/2023 with Otilio Saber to update H&P, EKG and labs.

## 2023-03-20 NOTE — Telephone Encounter (Signed)
ICD reached reached ERI 03/20/2023. Patient called to advise and advised I will forward to scheduling who contact patient to schedule apt with Dr. Graciela Husbands to discuss gen change. Patient appreciative of call.

## 2023-03-27 ENCOUNTER — Other Ambulatory Visit: Payer: Medicare Other

## 2023-03-27 NOTE — Progress Notes (Signed)
Remote ICD transmission.   

## 2023-03-28 ENCOUNTER — Other Ambulatory Visit: Payer: Medicare Other

## 2023-03-28 ENCOUNTER — Other Ambulatory Visit (INDEPENDENT_AMBULATORY_CARE_PROVIDER_SITE_OTHER): Payer: Medicare Other

## 2023-03-28 DIAGNOSIS — E039 Hypothyroidism, unspecified: Secondary | ICD-10-CM

## 2023-03-28 LAB — TSH: TSH: 1.21 u[IU]/mL (ref 0.35–5.50)

## 2023-03-28 LAB — T4, FREE: Free T4: 1.6 ng/dL (ref 0.60–1.60)

## 2023-03-28 LAB — T3, FREE: T3, Free: 2.7 pg/mL (ref 2.3–4.2)

## 2023-03-31 ENCOUNTER — Ambulatory Visit (INDEPENDENT_AMBULATORY_CARE_PROVIDER_SITE_OTHER): Payer: Medicare Other

## 2023-03-31 DIAGNOSIS — Z0000 Encounter for general adult medical examination without abnormal findings: Secondary | ICD-10-CM

## 2023-03-31 DIAGNOSIS — Z111 Encounter for screening for respiratory tuberculosis: Secondary | ICD-10-CM

## 2023-03-31 NOTE — Patient Instructions (Signed)
Health Maintenance, Male Adopting a healthy lifestyle and getting preventive care are important in promoting health and wellness. Ask your health care provider about: The right schedule for you to have regular tests and exams. Things you can do on your own to prevent diseases and keep yourself healthy. What should I know about diet, weight, and exercise? Eat a healthy diet  Eat a diet that includes plenty of vegetables, fruits, low-fat dairy products, and lean protein. Do not eat a lot of foods that are high in solid fats, added sugars, or sodium. Maintain a healthy weight Body mass index (BMI) is a measurement that can be used to identify possible weight problems. It estimates body fat based on height and weight. Your health care provider can help determine your BMI and help you achieve or maintain a healthy weight. Get regular exercise Get regular exercise. This is one of the most important things you can do for your health. Most adults should: Exercise for at least 150 minutes each week. The exercise should increase your heart rate and make you sweat (moderate-intensity exercise). Do strengthening exercises at least twice a week. This is in addition to the moderate-intensity exercise. Spend less time sitting. Even light physical activity can be beneficial. Watch cholesterol and blood lipids Have your blood tested for lipids and cholesterol at 87 years of age, then have this test every 5 years. You may need to have your cholesterol levels checked more often if: Your lipid or cholesterol levels are high. You are older than 87 years of age. You are at high risk for heart disease. What should I know about cancer screening? Many types of cancers can be detected early and may often be prevented. Depending on your health history and family history, you may need to have cancer screening at various ages. This may include screening for: Colorectal cancer. Prostate cancer. Skin cancer. Lung  cancer. What should I know about heart disease, diabetes, and high blood pressure? Blood pressure and heart disease High blood pressure causes heart disease and increases the risk of stroke. This is more likely to develop in people who have high blood pressure readings or are overweight. Talk with your health care provider about your target blood pressure readings. Have your blood pressure checked: Every 3-5 years if you are 18-39 years of age. Every year if you are 40 years old or older. If you are between the ages of 65 and 75 and are a current or former smoker, ask your health care provider if you should have a one-time screening for abdominal aortic aneurysm (AAA). Diabetes Have regular diabetes screenings. This checks your fasting blood sugar level. Have the screening done: Once every three years after age 45 if you are at a normal weight and have a low risk for diabetes. More often and at a younger age if you are overweight or have a high risk for diabetes. What should I know about preventing infection? Hepatitis B If you have a higher risk for hepatitis B, you should be screened for this virus. Talk with your health care provider to find out if you are at risk for hepatitis B infection. Hepatitis C Blood testing is recommended for: Everyone born from 1945 through 1965. Anyone with known risk factors for hepatitis C. Sexually transmitted infections (STIs) You should be screened each year for STIs, including gonorrhea and chlamydia, if: You are sexually active and are younger than 87 years of age. You are older than 87 years of age and your   health care provider tells you that you are at risk for this type of infection. Your sexual activity has changed since you were last screened, and you are at increased risk for chlamydia or gonorrhea. Ask your health care provider if you are at risk. Ask your health care provider about whether you are at high risk for HIV. Your health care provider  may recommend a prescription medicine to help prevent HIV infection. If you choose to take medicine to prevent HIV, you should first get tested for HIV. You should then be tested every 3 months for as long as you are taking the medicine. Follow these instructions at home: Alcohol use Do not drink alcohol if your health care provider tells you not to drink. If you drink alcohol: Limit how much you have to 0-2 drinks a day. Know how much alcohol is in your drink. In the [place] one drink equals one 12 oz bottle of beer (355 mL), one 5 oz glass of wine (148 mL), or one 1 oz glass of hard liquor (44 mL). Lifestyle Do not use any products that contain nicotine or tobacco. These products include cigarettes, chewing tobacco, and vaping devices, such as e-cigarettes. If you need help quitting, ask your health care provider. Do not use street drugs. Do not share needles. Ask your health care provider for help if you need support or information about quitting drugs. General instructions Schedule regular health, dental, and eye exams. Stay current with your vaccines. Tell your health care provider if: You often feel depressed. You have ever been abused or do not feel safe at home. Summary Adopting a healthy lifestyle and getting preventive care are important in promoting health and wellness. Follow your health care provider's instructions about healthy diet, exercising, and getting tested or screened for diseases. Follow your health care provider's instructions on monitoring your cholesterol and blood pressure. This information is not intended to replace advice given to you by your health care provider. Make sure you discuss any questions you have with your health care provider. Document Revised: 01/01/2021 Document Reviewed: 01/01/2021 [employer] Patient Education  2024 [employer]  

## 2023-03-31 NOTE — Progress Notes (Unsigned)
Subjective:   Justin [name] is a 89 y.o. male who presents for Medicare Annual/Subsequent preventive examination.  Visit Complete: Virtual  I connected with  Justin [name] on 03/31/23 by a audio enabled telemedicine application and verified that I am speaking with the correct person using two identifiers.  Patient Location: Other:  office  Provider Location: Home Office  I discussed the limitations of evaluation and management by telemedicine. The patient expressed understanding and agreed to proceed.  Patient Medicare AWV questionnaire was completed by the patient on 03/31/2023; I have confirmed that all information answered by patient is correct and no changes since this date.  Cardiac Risk Factors include: advanced age (>90men, >10 women);male gender     Objective:    Today's Vitals   There is no height or weight on file to calculate BMI.     03/31/2023   10:38 AM 03/25/2022   11:19 AM 02/06/2022   11:22 AM 11/08/2021    8:45 AM 04/24/2021   10:20 AM 03/22/2021    9:27 AM 05/19/2019    6:00 AM  Advanced Directives  Does Patient Have a Medical Advance Directive? Yes Yes Yes Yes Yes Yes Yes  Type of Estate agent of [place];Living will Healthcare Power of [place];Living will Healthcare Power of [place];Living will Living will Healthcare Power of [place];Living will Healthcare Power of [place];Living will Healthcare Power of [place];Living will  Does patient want to make changes to medical advance directive? No - Patient declined      No - Patient declined  Copy of Healthcare Power of Attorney in Chart? Yes - validated most recent copy scanned in chart (See row information) Yes - validated most recent copy scanned in chart (See row information) No - copy requested   No - copy requested Yes - validated most recent copy scanned in chart (See row information)    Current Medications (verified) Outpatient Encounter Medications as of 03/31/2023   Medication Sig   allopurinol (ZYLOPRIM) 100 MG tablet Take 1 tablet (100 mg total) by mouth daily.   amiodarone (PACERONE) 200 MG tablet Take 2 tablets by mouth twice daily x 2 weeks then decrease to 2 tablets by mouth daily x 2 weeks   aspirin 81 MG tablet Take 1 tablet (81 mg total) by mouth daily.   atorvastatin (LIPITOR) 20 MG tablet TAKE ONE TABLET ONCE DAILY   benzonatate (TESSALON) 200 MG capsule Take 1 capsule (200 mg total) by mouth 2 (two) times daily as needed for cough.   cetirizine (ZYRTEC) 10 MG tablet Take 10 mg by mouth 2 (two) times daily. [facility]   colchicine 0.6 MG tablet Take 1 tablet (0.6 mg total) by mouth every 6 (six) hours as needed (gout).   dabigatran (PRADAXA) 150 MG CAPS capsule TAKE 1 CAPSULE BY MOUTH TWICE  DAILY   dapagliflozin propanediol (FARXIGA) 10 MG TABS tablet Take 1 tablet (10 mg total) by mouth daily before breakfast.   diphenhydrAMINE (BENADRYL) 25 MG tablet Take 25 mg by mouth 2 (two) times daily.   EPINEPHrine 0.3 mg/0.3 mL IJ SOAJ injection Inject 0.3 mLs (0.3 mg total) into the muscle as needed for anaphylaxis.   fluorouracil (EFUDEX) 5 % cream Apply 1 application  topically 2 (two) times daily as needed (rash).   isosorbide mononitrate (IMDUR) 30 MG 24 hr tablet TAKE ONE TABLET ONCE DAILY   ketotifen (ZADITOR) 0.025 % ophthalmic solution Apply 1 drop to eye daily as needed (itchy eyes).  levothyroxine (SYNTHROID) 200 MCG tablet Take 1 tablet (200 mcg total) by mouth daily.   methocarbamol (ROBAXIN) 500 MG tablet Take 1 tablet (500 mg total) by mouth 4 (four) times daily.   metoCLOPramide (REGLAN) 10 MG tablet TAKE ONE TABLET BY MOUTH EVERY MORNING WITH BREAKFAST   metolazone (ZAROXOLYN) 2.5 MG tablet Take 1 tablet (2.5 mg total) by mouth once a week. Take 1 tablet every Saturday   metoprolol succinate (TOPROL-XL) 50 MG 24 hr tablet Take 75 mg by mouth daily. Take with or immediately following a meal.   montelukast (SINGULAIR) 10 MG tablet  TAKE ONE TABLET BY MOUTH ONCE DAILY IN THE EVENING   Multiple Vitamins-Minerals (PRESERVISION AREDS PO) Take 2 tablets by mouth every morning.   omeprazole (PRILOSEC) 20 MG capsule TAKE ONE CAPSULE TWICE DAILY   potassium chloride SA (KLOR-CON M) 20 MEQ tablet Take 1 tablet (20 mEq total) by mouth 2 (two) times daily.   spironolactone (ALDACTONE) 25 MG tablet Take 1 tablet (25 mg total) by mouth daily.   torsemide (DEMADEX) 20 MG tablet Take 2 tablets (40 mg total) every other day, alternating with 1 tablet (20 mg total) every other day.   triamcinolone cream (KENALOG) 0.1 % Apply 1 application  topically daily as needed for dry skin.   valACYclovir (VALTREX) 500 MG tablet Take 500 mg by mouth 2 (two) times daily as needed (cold sores).   No facility-administered encounter medications on file as of 03/31/2023.    Allergies (verified) Peanut-containing drug products, Sulfonamide derivatives, Amlodipine, Eliquis [apixaban], Lisinopril, and Xarelto [rivaroxaban]   History: Past Medical History:  Diagnosis Date   Age-related macular degeneration, dry, both eyes    Allergic    "24/7; 365 days/year; I'm allergic to pollens, dust, all southern grasses/trees, mold, mildue, cats, dogs" (01/07/2017)   Anal fissure    Asthma    sees Dr. Kendrick Fries    Benign prostatic hypertrophy    (sees Dr. Chester Holstein   CAD (coronary artery disease)    a. s/p CABG 2007. b. Cath 08/2016 - 4/4 patent grafts.   Carotid bruit    carotid u/s 10/10: 0.39% bilaterally   Chronic combined systolic and diastolic CHF (congestive heart failure) (HCC)    Complication of anesthesia 1980s   "w/anal cyst OR, he gave me a saddle block then put a narcotic in spinal cord; had a severe reaction to that" (01/07/2017)   Congestive heart failure (CHF) [facility])    ED (erectile dysfunction)    Family history of adverse reaction to anesthesia    "daughter wakes up during OR" (01/07/2017)   GERD (gastroesophageal reflux disease)    Gout    HTN  (hypertension)    Hx of colonic polyps    (sees Dr. Marina Goodell)   Hyperlipidemia    Hypothyroidism    Moderate to severe aortic stenosis    a. s/p TAVR 02/2017.   Myocardial infarction [facility]) ~ 2000   Obesity    Osteoarthritis    "was in my knees, hands" (01/07/2017 )   PAF (paroxysmal atrial fibrillation) (HCC)    a. documented post TAVR.   Precancerous skin lesion    (sees Dr. 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   29 mm Edwards Sapien 3 transcatheter heart valve placed via percutaneous right transfemoral approach   Past Surgical History:  Procedure Laterality Date   ANUS SURGERY     "  opened it back up cause it wouldn't heal; wound up w/a fissure" (01/07/2017)   BIV ICD INSERTION CRT-D N/A 05/02/2017   Procedure: BIV ICD INSERTION CRT-D;  Surgeon: Duke Salvia, MD;  Location: [facility] INVASIVE CV LAB;  Service: Cardiovascular;  Laterality: N/A;   CARDIAC CATHETERIZATION  10/29/2005   CARDIAC CATHETERIZATION N/A 08/27/2016   Procedure: Right/Left Heart Cath and Coronary/Graft Angiography;  Surgeon: Kathleene Hazel, MD;  Location: [facility] INVASIVE CV LAB;  Service: Cardiovascular;  Laterality: N/A;   CARDIOVERSION N/A 04/24/2021   Procedure: CARDIOVERSION;  Surgeon: Pricilla Riffle, MD;  Location[facility] ENDOSCOPY;  Service: Cardiovascular;  Laterality: N/A;   CARDIOVERSION N/A 11/08/2021   Procedure: CARDIOVERSION;  Surgeon: Chrystie Nose, MD;  Location: [facility] ENDOSCOPY;  Service: Cardiovascular;  Laterality: N/A;   CARDIOVERSION N/A 02/06/2022   Procedure: CARDIOVERSION;  Surgeon: Wendall Stade, MD;  Location: [facility] ENDOSCOPY;  Service: Cardiovascular;  Laterality: N/A;   CATARACT EXTRACTION W/ INTRAOCULAR LENS  IMPLANT, BILATERAL Bilateral    CATARACT EXTRACTION, BILATERAL  2012   COLONOSCOPY  06/30/2008   no repeats needed    COLONOSCOPY     had 3 or 4 in the past    CORONARY ARTERY BYPASS GRAFT  2007    "CABG X4"   CYST EXCISION PERINEAL  1980s   HAMMER TOE SURGERY Bilateral    JOINT REPLACEMENT     KNEE ARTHROPLASTY  07/30/2011   Procedure: COMPUTER ASSISTED TOTAL KNEE ARTHROPLASTY;  Surgeon: Cammy Copa;  Location: [facility] OR;  Service: Orthopedics;  Laterality: Left;  left total knee arthroplasty   MASTECTOMY SUBCUTANEOUS Bilateral    MULTIPLE TOOTH EXTRACTIONS     ORIF FINGER / THUMB FRACTURE Right ~ 1980   "repair of thumb injury"   PROSTATE BIOPSY     REPLACEMENT TOTAL KNEE BILATERAL Bilateral 2012   TEE WITHOUT CARDIOVERSION N/A 03/04/2017   Procedure: TRANSESOPHAGEAL ECHOCARDIOGRAM (TEE);  Surgeon: Kathleene Hazel, MD;  Location: [facility] OR;  Service: Open Heart Surgery;  Laterality: N/A;   TOTAL KNEE REVISION Right 05/18/2019   Procedure: RIGHT PATELLA REVISION/REMOVAL;  Surgeon: Cammy Copa, MD;  Location: [facility] OR;  Service: Orthopedics;  Laterality: Right;   TRANSCATHETER AORTIC VALVE REPLACEMENT, TRANSFEMORAL N/A 03/04/2017   Procedure: TRANSCATHETER AORTIC VALVE REPLACEMENT, TRANSFEMORAL;  Surgeon: Kathleene Hazel, MD;  Location: [facility] OR;  Service: Open Heart Surgery;  Laterality: N/A;   Family History  Problem Relation Age of Onset   Heart attack Father 68   Allergic rhinitis Father    Asthma Father    Heart failure Mother 21   Uterine cancer Mother    Breast cancer Mother    Colon cancer Neg Hx    Esophageal cancer Neg Hx    Social History   Socioeconomic History   Marital status: Married    Spouse name: Not on file   Number of children: 3   Years of education: Not on file   Highest education level: Not on file  Occupational History   Occupation: Product/process development scientist    Comment: builds malls  Tobacco Use   Smoking status: Former    Current packs/day: 0.00    Average packs/day: 3.5 packs/day for 13.0 years (45.5 ttl pk-yrs)    Types: Cigarettes    Start date: 96    Quit date: 1963    Years since quitting: 61.6   Smokeless tobacco: Never    Tobacco comments:    Former smoker 04/19/21  Vaping Use   Vaping status:  Never Used  Substance and Sexual Activity   Alcohol use: Not Currently    Alcohol/week: 7.0 standard drinks of alcohol    Types: 7 Cans of beer per week    Comment: 1 beer daily after 5pm (04/19/21)   Drug use: No   Sexual activity: Never  Other Topics Concern   Not on file  Social History Narrative   FH of CAD, Male 1st degree relative less than age 67.   Social Determinants of Health   Financial Resource Strain: Low Risk  (03/31/2023)   Overall Financial Resource Strain (CARDIA)    Difficulty of Paying Living Expenses: Not very hard  Food Insecurity: No Food Insecurity (03/31/2023)   Hunger Vital Sign    Worried About Running Out of Food in the Last Year: Never true    Ran Out of Food in the Last Year: Never true  Transportation Needs: No Transportation Needs (03/31/2023)   PRAPARE - Administrator, Civil Service (Medical): No    Lack of Transportation (Non-Medical): No  Physical Activity: Sufficiently Active (03/31/2023)   Exercise Vital Sign    Days of Exercise per Week: 7 days    Minutes of Exercise per Session: 30 min  Stress: No Stress Concern Present (03/31/2023)   [employer] of Occupational Health - Occupational Stress Questionnaire    Feeling of Stress : Not at all  Social Connections: Moderately Integrated (03/31/2023)   Social Connection and Isolation Panel [NHANES]    Frequency of Communication with Friends and Family: Three times a week    Frequency of Social Gatherings with Friends and Family: Three times a week    Attends Religious Services: Never    Active Member of Clubs or Organizations: Yes    Attends Banker Meetings: Never    Marital Status: Married    Tobacco Counseling Counseling given: Not Answered Tobacco comments: Former smoker 04/19/21   Clinical Intake:     Pain : No/denies pain     Diabetes: No  How often do you need to have someone  help you when you read instructions, pamphlets, or other written materials from your doctor or pharmacy?: 1 - Never What is the last grade level you completed in school?: bach degree  Interpreter Needed?: No      Activities of Daily Living    03/31/2023   10:44 AM  In your present state of health, do you have any difficulty performing the following activities:  Hearing? 0  Vision? 1  Difficulty concentrating or making decisions? 0  Walking or climbing stairs? 0  Dressing or bathing? 0  Doing errands, shopping? 0  Preparing Food and eating ? N  Using the Toilet? N  In the past six months, have you accidently leaked urine? N  Do you have problems with loss of bowel control? N  Managing your Medications? N  Managing your Finances? N  Housekeeping or managing your Housekeeping? N    Patient Care Team: Nelwyn Salisbury, MD as PCP - General [place] James Nile Dear, MD as PCP - Cardiology (Cardiology) Duke Salvia, MD as PCP - Electrophysiology (Cardiology) Jerilee Field, MD as Consulting Physician (Urology) Verner Chol, [facility] (Inactive) as Pharmacist (Pharmacist)  Indicate any recent Medical Services you may have received from other than [facility] providers in the past year (date may be approximate).     Assessment:   This is a routine wellness examination for Justin [name].  Hearing/Vision screen No results found.  Dietary issues and exercise  activities discussed:     Goals Addressed             This Visit's Progress    other       Moving into assisted living       Depression Screen    03/31/2023   10:41 AM 10/10/2022   11:30 AM 03/25/2022   11:20 AM 03/22/2021    9:28 AM 03/22/2021    9:22 AM 03/11/2019    8:04 AM 06/02/2017    9:22 AM  PHQ 2/9 Scores  PHQ - 2 Score 0 0 0 0 0 0 0  PHQ- 9 Score 0 0         Fall Risk    03/31/2023   10:48 AM 10/10/2022   11:28 AM 03/25/2022   11:20 AM 03/27/2021    9:01 AM 03/22/2021    9:28 AM  Fall Risk   Falls in the past  year? 0 1 0 1 1  Number falls in past yr: 0 1 0 0 0  Injury with Fall? 0 1 0 1 1  Comment  Back pain     Risk for fall due to : No Fall Risks No Fall Risks Medication side effect Impaired vision Impaired balance/gait  Follow up Falls evaluation completed Falls evaluation completed Falls evaluation completed;Education provided;Falls prevention discussed Follow up appointment Falls evaluation completed    MEDICARE RISK AT HOME:  Medicare Risk at Home - 03/31/23 1048     Any stairs in or around the home? Yes    If so, are there any without handrails? No    Home free of loose throw rugs in walkways, pet beds, electrical cords, etc? Yes    Adequate lighting in your home to reduce risk of falls? Yes    Life alert? No    Use of a cane, walker or w/c? No    Grab bars in the bathroom? Yes    Shower chair or bench in shower? Yes    Elevated toilet seat or a handicapped toilet? No             TIMED UP AND GO:  Was the test performed?  No    Cognitive Function:        03/31/2023   10:49 AM 03/25/2022   11:21 AM  6CIT Screen  What Year? 0 points 0 points  What month? 0 points 0 points  What time? 0 points 0 points  Count back from 20 2 points 4 points  Months in reverse 0 points 0 points  Repeat phrase 0 points 0 points  Total Score 2 points 4 points    Immunizations Immunization History  Administered Date(s) Administered   Fluad Quad(high Dose 65+) 05/06/2019, 05/30/2020, 06/18/2021   Hep A / Hep B 04/06/2012, 10/07/2012   Hepatitis B 05/07/2012   Influenza Split 06/28/2013   Influenza Whole 06/05/2007, 05/13/2008, 06/06/2009, 05/30/2010   Influenza, High Dose Seasonal PF 05/15/2018, 05/31/2022   Influenza,inj,Quad PF,6+ Mos 05/20/2016   Influenza-Unspecified 05/25/2014, 07/08/2015, 05/06/2017   Moderna Covid-19 Vaccine Bivalent Booster 13yrs & up 05/31/2022   PFIZER(Purple Top)SARS-COV-2 Vaccination 09/28/2019, 10/12/2019, 05/30/2020   Pfizer Covid-19 Vaccine Bivalent  Booster 78yrs & up 11/18/2021   Pneumococcal Conjugate-13 09/18/2015   Pneumococcal Polysaccharide-23 05/13/2008, 05/25/2014   Td 05/30/2010   Tdap 10/27/2020   Zoster Recombinant(Shingrix) 05/18/2018, 10/16/2018   Zoster, Live 10/21/2007    TDAP status: Up to date  Flu Vaccine status: Up to date  Pneumococcal vaccine status: Up to date  Covid-19 vaccine status: Information provided on how to obtain vaccines.   Qualifies for Shingles Vaccine? Yes   Zostavax completed Yes   Shingrix Completed?: Yes  Screening Tests Health Maintenance  Topic Date Due   INFLUENZA VACCINE  03/27/2023   COVID-19 Vaccine (6 - 2023-24 season) 10/24/2023 (Originally 07/26/2022)   Medicare Annual Wellness (AWV)  03/30/2024   DTaP/Tdap/Td (3 - Td or Tdap) 10/28/2030   Pneumonia Vaccine 76+ Years old  Completed   Zoster Vaccines- Shingrix  Completed   HPV VACCINES  Aged Out    Health Maintenance  Health Maintenance Due  Topic Date Due   INFLUENZA VACCINE  03/27/2023    Colorectal cancer screening: No longer required.   Lung Cancer Screening: (Low Dose CT Chest recommended if Age 33-80 years, 20 pack-year currently smoking OR have quit w/in 15years.) does not qualify.   Lung Cancer Screening Referral: na  Additional Screening:  Hepatitis C Screening: does qualify; Completed yes  Vision Screening: Recommended annual ophthalmology exams for early detection of glaucoma and other disorders of the eye. Is the patient up to date with their annual eye exam?  Yes  Who is the provider or what is the name of the office in which the patient attends annual eye exams?  If pt is not established with a provider, would they like to be referred to a provider to establish care?  Established  .   Dental Screening: Recommended annual dental exams for proper oral hygiene  Diabetic Foot Exam:   Community Resource Referral / Chronic Care Management: CRR required this visit?  No   CCM required this visit?   No     Plan:     I have personally reviewed and noted the following in the patient's chart:   Medical and social history Use of alcohol, tobacco or illicit drugs  Current medications and supplements including opioid prescriptions. Patient is not currently taking opioid prescriptions. Functional ability and status Nutritional status Physical activity Advanced directives List of other physicians Hospitalizations, surgeries, and ER visits in previous 12 months Vitals Screenings to include cognitive, depression, and falls Referrals and appointments  In addition, I have reviewed and discussed with patient certain preventive protocols, quality metrics, and best practice recommendations. A written personalized care plan for preventive services as well as general preventive health recommendations were provided to patient.     Delana Meyer   03/31/2023   After Visit Summary: (MyChart) Due to this being a telephonic visit, the after visit summary with patients personalized plan was offered to patient via MyChart   Nurse Notes: pt got off the phone while completing note due to wife leaving appt with Dr Clent Ridges

## 2023-04-02 ENCOUNTER — Ambulatory Visit: Payer: Medicare Other

## 2023-04-02 ENCOUNTER — Telehealth: Payer: Self-pay | Admitting: Family Medicine

## 2023-04-02 MED ORDER — SERTRALINE HCL 50 MG PO TABS: 50.0000 mg | ORAL_TABLET | Freq: Every day | ORAL | 2 refills | Status: DC

## 2023-04-02 NOTE — Progress Notes (Signed)
PPD Reading Note  PPD read and results entered in EpicCare.  Result: 0 mm induration.  Interpretation: Negative  If test not read within 48-72 hours of initial placement, patient advised to repeat in other arm 1-3 weeks after this test.  Allergic reaction: no

## 2023-04-02 NOTE — Telephone Encounter (Signed)
Patient's daughter states patient would like a prescription sent in for anxiety--discussed at last visit.   Pharmacy-  [facility]

## 2023-04-02 NOTE — Telephone Encounter (Signed)
Call in Sertraline 50 mg daily, #30 with 2 rf

## 2023-04-02 NOTE — Progress Notes (Addendum)
PPD Placement note Justin [name], 89 y.o. male is here today for placement of PPD test Reason for PPD test: Moving into a retirement Facility Pt taken PPD test before: no Verified in allergy area and with patient that they are not allergic to the products PPD is made of (Phenol or Tween). Yes Is patient taking any oral or IV steroid medication now or have they taken it in the last month? no Has the patient ever received the BCG vaccine?: no Has the patient been in recent contact with anyone known or suspected of having active TB disease?: no      Date of exposure (if applicable): N/A      Name of person they were exposed to (if applicable): N/A Patient's Country of origin?: [place] States O: Alert and oriented in NAD. P:  PPD placed on 04/02/2023.  Patient advised to return for reading within 48-72 hours.

## 2023-04-02 NOTE — Telephone Encounter (Signed)
Rx sent 

## 2023-04-13 ENCOUNTER — Other Ambulatory Visit: Payer: Self-pay | Admitting: Family Medicine

## 2023-04-13 ENCOUNTER — Other Ambulatory Visit: Payer: Self-pay | Admitting: Cardiovascular Disease

## 2023-04-14 ENCOUNTER — Other Ambulatory Visit: Payer: Self-pay | Admitting: Adult Health

## 2023-04-14 ENCOUNTER — Ambulatory Visit (INDEPENDENT_AMBULATORY_CARE_PROVIDER_SITE_OTHER): Payer: Medicare Other

## 2023-04-14 DIAGNOSIS — I255 Ischemic cardiomyopathy: Secondary | ICD-10-CM

## 2023-04-14 DIAGNOSIS — I495 Sick sinus syndrome: Secondary | ICD-10-CM

## 2023-04-14 LAB — CUP PACEART REMOTE DEVICE CHECK
Battery Remaining Longevity: 1 mo — CL
Battery Voltage: 2.67 V
Brady Statistic RA Percent Paced: 0.52 %
Brady Statistic RV Percent Paced: 78.43 %
Date Time Interrogation Session: 20240819061805
HighPow Impedance: 98 Ohm
Implantable Lead Connection Status: 753985
Implantable Lead Connection Status: 753985
Implantable Lead Connection Status: 753985
Implantable Lead Implant Date: 20180907
Implantable Lead Implant Date: 20180907
Implantable Lead Implant Date: 20180907
Implantable Lead Location: 753858
Implantable Lead Location: 753859
Implantable Lead Location: 753860
Implantable Lead Model: 4396
Implantable Lead Model: 5076
Implantable Pulse Generator Implant Date: 20180907
Lead Channel Impedance Value: 1159 Ohm
Lead Channel Impedance Value: 323 Ohm
Lead Channel Impedance Value: 399 Ohm
Lead Channel Impedance Value: 399 Ohm
Lead Channel Impedance Value: 589 Ohm
Lead Channel Impedance Value: 760 Ohm
Lead Channel Pacing Threshold Amplitude: 0.875 V
Lead Channel Pacing Threshold Amplitude: 1.125 V
Lead Channel Pacing Threshold Amplitude: 1.5 V
Lead Channel Pacing Threshold Pulse Width: 0.4 ms
Lead Channel Pacing Threshold Pulse Width: 0.4 ms
Lead Channel Pacing Threshold Pulse Width: 1 ms
Lead Channel Sensing Intrinsic Amplitude: 1.5 mV
Lead Channel Sensing Intrinsic Amplitude: 1.5 mV
Lead Channel Sensing Intrinsic Amplitude: 11.625 mV
Lead Channel Sensing Intrinsic Amplitude: 11.625 mV
Lead Channel Setting Pacing Amplitude: 1.75 V
Lead Channel Setting Pacing Amplitude: 2.25 V
Lead Channel Setting Pacing Amplitude: 2.5 V
Lead Channel Setting Pacing Pulse Width: 0.4 ms
Lead Channel Setting Pacing Pulse Width: 1 ms
Lead Channel Setting Sensing Sensitivity: 0.3 mV
Zone Setting Status: 755011

## 2023-04-18 ENCOUNTER — Encounter: Payer: Self-pay | Admitting: Student

## 2023-04-18 ENCOUNTER — Ambulatory Visit: Payer: Medicare Other | Admitting: Student

## 2023-04-18 ENCOUNTER — Encounter: Payer: Self-pay | Admitting: *Deleted

## 2023-04-18 VITALS — BP 108/68 | HR 105 | Ht 66.5 in | Wt 257.0 lb

## 2023-04-18 DIAGNOSIS — I5042 Chronic combined systolic (congestive) and diastolic (congestive) heart failure: Secondary | ICD-10-CM

## 2023-04-18 DIAGNOSIS — I4819 Other persistent atrial fibrillation: Secondary | ICD-10-CM | POA: Diagnosis not present

## 2023-04-18 DIAGNOSIS — I255 Ischemic cardiomyopathy: Secondary | ICD-10-CM

## 2023-04-18 DIAGNOSIS — I2510 Atherosclerotic heart disease of native coronary artery without angina pectoris: Secondary | ICD-10-CM

## 2023-04-18 LAB — CUP PACEART INCLINIC DEVICE CHECK
Battery Remaining Longevity: 1 mo — CL
Battery Voltage: 2.68 V
Brady Statistic RA Percent Paced: 0.58 %
Brady Statistic RV Percent Paced: 75.74 %
Date Time Interrogation Session: 20240823114529
HighPow Impedance: 88 Ohm
Implantable Lead Connection Status: 753985
Implantable Lead Connection Status: 753985
Implantable Lead Connection Status: 753985
Implantable Lead Implant Date: 20180907
Implantable Lead Implant Date: 20180907
Implantable Lead Implant Date: 20180907
Implantable Lead Location: 753858
Implantable Lead Location: 753859
Implantable Lead Location: 753860
Implantable Lead Model: 4396
Implantable Lead Model: 5076
Implantable Pulse Generator Implant Date: 20180907
Lead Channel Impedance Value: 1083 Ohm
Lead Channel Impedance Value: 380 Ohm
Lead Channel Impedance Value: 399 Ohm
Lead Channel Impedance Value: 456 Ohm
Lead Channel Impedance Value: 570 Ohm
Lead Channel Impedance Value: 722 Ohm
Lead Channel Pacing Threshold Amplitude: 0.875 V
Lead Channel Pacing Threshold Amplitude: 1.125 V
Lead Channel Pacing Threshold Amplitude: 1.5 V
Lead Channel Pacing Threshold Pulse Width: 0.4 ms
Lead Channel Pacing Threshold Pulse Width: 0.4 ms
Lead Channel Pacing Threshold Pulse Width: 1 ms
Lead Channel Sensing Intrinsic Amplitude: 1.125 mV
Lead Channel Sensing Intrinsic Amplitude: 1.25 mV
Lead Channel Sensing Intrinsic Amplitude: 11.125 mV
Lead Channel Sensing Intrinsic Amplitude: 12.625 mV
Lead Channel Setting Pacing Amplitude: 1.75 V
Lead Channel Setting Pacing Amplitude: 2.25 V
Lead Channel Setting Pacing Amplitude: 2.5 V
Lead Channel Setting Pacing Pulse Width: 0.4 ms
Lead Channel Setting Pacing Pulse Width: 1 ms
Lead Channel Setting Sensing Sensitivity: 0.3 mV
Zone Setting Status: 755011

## 2023-04-18 NOTE — Progress Notes (Signed)
  Electrophysiology Office Note:   ID:  Nygil, L[place] [birth year], MRN [id number]  Primary Cardiologist: Verne Carrow, MD Electrophysiologist: Sherryl Manges, MD      History of Present Illness:   Justin [name] is a 89 y.o. male with h/o VT, SND s/p PPM, Persistent AF/AFL, s/p TAVR, obesity, PVCs, and chronic systolic CHF  seen today for routine electrophysiology followup and visit prior to gen change  Since last being seen in our clinic the patient reports doing about the same. Mild SOB with moderate exertion. Moving to independent living to be closer to his wife in October. Wants to make sure he is able to walk through the buildings to get to her in the nursing area of [facility].SOB with inclines, but does OK around the house.  Edema has overall been well controlled.   Review of systems complete and found to be negative unless listed in HPI.   EP Information / Studies Reviewed:    EKG is ordered today. Personal review as below.  EKG Interpretation Date/Time:  Friday April 18 2023 11:03:22 EDT Ventricular Rate:  105 PR Interval:    QRS Duration:  116 QT Interval:  396 QTC Calculation: 523 R Axis:   226  Text Interpretation: Atrial fibrillation Ventricular-paced rhythm with premature ventricular or aberrantly conducted complexes Confirmed by Maxine Glenn [phone]) on 04/18/2023 11:53:09 AM    ICD Interrogation-  reviewed in detail today,  See PACEART report.  Device History: Medtronic BiV ICD implanted 04/2017 for syncope and new LBBB following TAVR History of appropriate therapy: No History of AAD therapy: No   Physical Exam:   VS:  BP 108/68   Pulse (!) 105   Ht 5' 6.5" (1.689 m)   Wt 257 lb (116.6 kg)   BMI 40.86 kg/m    Wt Readings from Last 3 Encounters:  04/18/23 257 lb (116.6 kg)  02/17/23 262 lb 6.4 oz (119 kg)  01/13/23 266 lb 3.2 oz (120.7 kg)     GEN: Well nourished, well developed in no acute distress NECK: No JVD; No carotid  bruits CARDIAC: Regular rate and rhythm, no murmurs, rubs, gallops RESPIRATORY:  Clear to auscultation without rales, wheezing or rhonchi  ABDOMEN: Soft, non-tender, non-distended EXTREMITIES:  No edema; No deformity   ASSESSMENT AND PLAN:    Chronic systolic dysfunction s/p Medtronic CRT-D  Echo 11/12/2022 LVEF 40-45% euvolemic today NYHA III symptoms. Stable on an appropriate medical regimen Normal ICD function for device at ERI as of 03/20/2023 See Pace Art report No changes today Pt and family wish to leave ICD therapies in tact. He is Full Code   VT, prolonged. Non-sustained Quiescent on amiodarone Continue amiodarone 200 mg daily Surveillance labs today.    Permanent atrial fibrillation H/o AFL / AT Continue pradaxa, hold for procedure.  Rates with slight R shift. ? If he would benefit from AV nodal ablation.  Limits BiV pacing.    AS s/p TAVR Stable by echo 11/12/2022    CAD s/p CABG 2007 Denies s/s ischemia   Frequent PVCs Chronically the "counters" have not been high but have been able to judge off of VP%.  Improved a good deal with amiodarone load.  BiV pacing ~75.6% today, but more likely in setting of permanent AF as above.    Disposition:   Follow up with Dr. Graciela Husbands as usual post procedure   Signed, Graciella Freer, PA-C

## 2023-04-18 NOTE — Patient Instructions (Signed)
Medication Instructions:  Your physician recommends that you continue on your current medications as directed. Please refer to the Current Medication list given to you today.  *If you need a refill on your cardiac medications before your next appointment, please call your pharmacy*  Lab Work: CMET, CBC, TSH, FreeT4-TODAY If you have labs (blood work) drawn today and your tests are completely normal, you will receive your results only by: MyChart Message (if you have MyChart) OR A paper copy in the mail If you have any lab test that is abnormal or we need to change your treatment, we will call you to review the results.  Testing/Procedures: See instruction letter  Follow-Up: At [facility], you and your health needs are our priority.  As part of our continuing mission to provide you with exceptional heart care, we have created designated Provider Care Teams.  These Care Teams include your primary Cardiologist (physician) and Advanced Practice Providers (APPs -  Physician Assistants and Nurse Practitioners) who all work together to provide you with the care you need, when you need it.   Your next appointment:   Follow up appointments will be scheduled for you and print out on your discharge summary after your procedure

## 2023-04-19 LAB — COMPREHENSIVE METABOLIC PANEL
ALT: 23 IU/L (ref 0–44)
AST: 27 IU/L (ref 0–40)
Albumin: 3.3 g/dL — ABNORMAL LOW (ref 3.7–4.7)
Alkaline Phosphatase: 122 IU/L — ABNORMAL HIGH (ref 44–121)
BUN/Creatinine Ratio: 22 (ref 10–24)
BUN: 29 mg/dL — ABNORMAL HIGH (ref 8–27)
Bilirubin Total: 0.8 mg/dL (ref 0.0–1.2)
CO2: 25 mmol/L (ref 20–29)
Calcium: 8.8 mg/dL (ref 8.6–10.2)
Chloride: 95 mmol/L — ABNORMAL LOW (ref 96–106)
Creatinine, Ser: 1.33 mg/dL — ABNORMAL HIGH (ref 0.76–1.27)
Globulin, Total: 3.4 g/dL (ref 1.5–4.5)
Glucose: 131 mg/dL — ABNORMAL HIGH (ref 70–99)
Potassium: 3.9 mmol/L (ref 3.5–5.2)
Sodium: 138 mmol/L (ref 134–144)
Total Protein: 6.7 g/dL (ref 6.0–8.5)
eGFR: 52 mL/min/{1.73_m2} — ABNORMAL LOW (ref >59)

## 2023-04-19 LAB — CBC
Hematocrit: 44.5 % (ref 37.5–51.0)
Hemoglobin: 14.1 g/dL (ref 13.0–17.7)
MCH: 26.6 pg (ref 26.6–33.0)
MCHC: 31.7 g/dL (ref 31.5–35.7)
MCV: 84 fL (ref 79–97)
Platelets: 311 10*3/uL (ref 150–450)
RBC: 5.31 x10E6/uL (ref 4.14–5.80)
RDW: 17.3 % — ABNORMAL HIGH (ref 11.6–15.4)
WBC: 12.8 10*3/uL — ABNORMAL HIGH (ref 3.4–10.8)

## 2023-04-19 LAB — T4, FREE: Free T4: 1.75 ng/dL (ref 0.82–1.77)

## 2023-04-19 LAB — TSH: TSH: 0.844 u[IU]/mL (ref 0.450–4.500)

## 2023-04-21 ENCOUNTER — Ambulatory Visit: Payer: Medicare Other

## 2023-04-21 DIAGNOSIS — I5042 Chronic combined systolic (congestive) and diastolic (congestive) heart failure: Secondary | ICD-10-CM | POA: Diagnosis not present

## 2023-04-21 DIAGNOSIS — Z95810 Presence of automatic (implantable) cardiac defibrillator: Secondary | ICD-10-CM

## 2023-04-24 ENCOUNTER — Telehealth: Payer: Self-pay

## 2023-04-24 NOTE — Progress Notes (Signed)
EPIC Encounter for ICM Monitoring  Patient Name: Justin [name] is a 89 y.o. male Date: 04/24/2023 Primary Care Physican: Nelwyn Salisbury, MD Electrophysiologist: Joycelyn Schmid Pacing: 77.1% Effective 74.1%  01/08/2022 Weight: 260 lbs 06/17/2022 Weight: 272 lbs (baseline 269 lbs) 08/13/2022 Weight: 272 lbs 09/27/2022 Weight: 270 lbs 02/12/2023 Weight: 261 lbs   Since 12-Mar-2023 AT/AF  1 Time in AT/AF  24.0 hr/day (100.0%) Longest AT/AF  4 months   Attempted call to patient and unable to reach.  Left detailed message per DPR regarding transmission. Transmission reviewed.    Device replacement scheduled 9/6.   Diet:  Not strict with limiting salt.    Optivol thoracic impedance suggesting normal fluid levels within the last month.     Prescribed: Torsemide 20 mg take 2 tablets (40 mg total) every other day alternating with 1 tablet (20 mg total) every other day.   Potassium 20 mEq take 1 tablet by mouth twice a day Metolazone 2.5 mg Take 1 tablet by mouth once a week on Saturday (per 7/15 phone note should take extra 40 mEq of Potassium with Metolazone). Spironolactone 25 mg take 1 tablet daily   Labs: 03/07/2023 Creatinine 1.47, BUN 28, Potassium 3.7, Sodium 140, GFR 46  02/25/2023 Creatinine 1.99, BUN 38, Potassium 3.4, Sodium 134, GFR 32  02/17/2023 Creatinine 1.94, BUN 42, Potassium 3.4, Sodium 136, GFR 33  12/04/2022 Creatinine 1.68, BUN 26, Potassium 4.6, Sodium 140, GFR 39 11/20/2022 Creatinine 1.39, BUN 28, Potassium 3.2, Sodium 139, GFR 49  10/31/2022 Creatinine 1.81, BUN 44, Potassium 4.3, Sodium 132, GFR 36  A complete set of results can be found in Results Review.   Recommendations:   Left voice mail with ICM number and encouraged to call if experiencing any fluid symptoms.   Follow-up plan: ICM clinic phone appointment on 06/09/2023.   91 day device clinic remote transmission pending device replacement.     EP/Cardiology Office Visits: 05/26/2023 with Dr Graciela Husbands.    Copy  of ICM check sent to Dr. Graciela Husbands.     3 month ICM trend: 04/19/2023.    12-14 Month ICM trend:     Karie Soda, RN 04/24/2023 3:49 PM

## 2023-04-24 NOTE — Telephone Encounter (Signed)
Remote ICM transmission received.  Attempted call to patient regarding ICM remote transmission and left detailed message per DPR.  Left ICM phone number and advised to return call for any fluid symptoms or questions. Next ICM remote transmission scheduled 06/10/2023.

## 2023-04-24 NOTE — Progress Notes (Signed)
Remote ICD transmission.   

## 2023-04-30 ENCOUNTER — Telehealth: Payer: Self-pay | Admitting: Internal Medicine

## 2023-04-30 DIAGNOSIS — I5042 Chronic combined systolic (congestive) and diastolic (congestive) heart failure: Secondary | ICD-10-CM

## 2023-04-30 NOTE — Telephone Encounter (Signed)
Spoke with pt's daughter and advised per Dr Graciela Husbands pt will need to have generator change rescheduled.  Will have EP procedure scheduler contact pt.  Pt's device reached ERI 03/22/2023 per Dr Graciela Husbands.

## 2023-04-30 NOTE — Telephone Encounter (Signed)
Patient's daughter states her husband has COVID and they live with patient. Patient does not have any symptoms and son in law is remaining in quarantine in a separate bedroom. Patient's daughter would like to know if patient needs to reschedule 9/06 procedure with Dr. Graciela Husbands. She states she can give him a COVID test Thursday night if needed.

## 2023-04-30 NOTE — Telephone Encounter (Signed)
EP scheduler, April advised of need to reschedule.  She will contact pt with new generator change date.

## 2023-04-30 NOTE — Telephone Encounter (Signed)
Spoke with pt's daughter who states pt has had a Covid exposure.  Pt currently has no symptoms.  She is asking if pt will need to reschedule generator change scheduled for 05/02/2023.  Advised will discuss with Dr Graciela Husbands and will let pt know what his recommendation is.  Pt's daughter verbalizes understanding and thanked Charity fundraiser for the call.

## 2023-05-01 DIAGNOSIS — H353221 Exudative age-related macular degeneration, left eye, with active choroidal neovascularization: Secondary | ICD-10-CM | POA: Diagnosis not present

## 2023-05-01 NOTE — Pre-Procedure Instructions (Signed)
Instructed patient on the following items: Arrival time 1100 Nothing to eat or drink after midnight No meds AM of procedure Responsible person to drive you home and stay with you for 24 hrs Wash with special soap night before and morning of procedure If on anti-coagulant drug instructions Pradaxa- hold 2 days.

## 2023-05-02 ENCOUNTER — Telehealth: Payer: Self-pay | Admitting: Family Medicine

## 2023-05-02 DIAGNOSIS — I5042 Chronic combined systolic (congestive) and diastolic (congestive) heart failure: Secondary | ICD-10-CM

## 2023-05-02 DIAGNOSIS — Z95810 Presence of automatic (implantable) cardiac defibrillator: Secondary | ICD-10-CM

## 2023-05-02 DIAGNOSIS — I495 Sick sinus syndrome: Secondary | ICD-10-CM

## 2023-05-02 NOTE — Addendum Note (Signed)
Addended by: Cleda Mccreedy on: 05/02/2023 11:07 AM   Modules accepted: Orders

## 2023-05-02 NOTE — Telephone Encounter (Signed)
I spoke with Pt's Daughter and the Pt and her have both tested positive for COVID.  He has been rescheduled for 10/4 at 7:30 AM. He will come to [facility] on 9/27 for labwork.  I will mail Instruction letter once completed.

## 2023-05-02 NOTE — Telephone Encounter (Signed)
[name] daughter is calling and her dad has been exposed to covid the family has covid. Pt is having cough and runny nose started last night and would like paxlovid sent to  [facility] [place], [place] - [address] Phone: [phone]  Fax: [phone]    Pt daughter said nancy told her if she need anything for her dad to call she mentioned he if got covid

## 2023-05-02 NOTE — Telephone Encounter (Signed)
FYI Spoke with pt advised to go to UC, Pt PCP unavailable and no opening at the clinic. Pt daughter verbalized udnerstanding

## 2023-05-06 ENCOUNTER — Encounter: Payer: Self-pay | Admitting: Family Medicine

## 2023-05-06 ENCOUNTER — Telehealth: Payer: Self-pay

## 2023-05-06 ENCOUNTER — Other Ambulatory Visit: Payer: Self-pay | Admitting: Family Medicine

## 2023-05-06 ENCOUNTER — Telehealth (INDEPENDENT_AMBULATORY_CARE_PROVIDER_SITE_OTHER): Payer: Medicare Other | Admitting: Family Medicine

## 2023-05-06 VITALS — Temp 97.1°F

## 2023-05-06 DIAGNOSIS — R300 Dysuria: Secondary | ICD-10-CM

## 2023-05-06 DIAGNOSIS — U071 COVID-19: Secondary | ICD-10-CM

## 2023-05-06 MED ORDER — BENZONATATE 200 MG PO CAPS: 200.0000 mg | ORAL_CAPSULE | Freq: Two times a day (BID) | ORAL | 2 refills | Status: DC | PRN

## 2023-05-06 MED ORDER — AZITHROMYCIN 250 MG PO TABS: ORAL_TABLET | ORAL | 0 refills | Status: DC

## 2023-05-06 MED ORDER — ALBUTEROL SULFATE HFA 108 (90 BASE) MCG/ACT IN AERS: 2.0000 | INHALATION_SPRAY | RESPIRATORY_TRACT | 5 refills | Status: AC | PRN

## 2023-05-06 NOTE — Telephone Encounter (Signed)
Cancel the Zpack and call in Augmentin 875 BID for 10 days

## 2023-05-06 NOTE — Progress Notes (Signed)
Subjective:    Patient ID: Justin [name], male    DOB: 07-Oct-1933, 89 y.o.   MRN: [id number]  HPI Virtual Visit via Video Note  I connected with the patient on 05/06/23 at 10:00 AM EDT by a video enabled telemedicine application and verified that I am speaking with the correct person using two identifiers.  Location patient: home Location provider:work or home office Persons participating in the virtual visit: patient, provider  I discussed the limitations of evaluation and management by telemedicine and the availability of in person appointments. The patient expressed understanding and agreed to proceed.   HPI: Here with his daughter for 5 days of coughing up clear sputum, mild SOB, and body aches. No chest pain or fever. He tested positive for the Covid virus yesterday. Several other family members have also tested positive. He is drinking fluids. His oxygen saturation this am is 97%.    ROS: See pertinent positives and negatives per HPI.  Past Medical History:  Diagnosis Date   Age-related macular degeneration, dry, both eyes    Allergic    "24/7; 365 days/year; I'm allergic to pollens, dust, all southern grasses/trees, mold, mildue, cats, dogs" (01/07/2017)   Anal fissure    Asthma    sees Dr. Kendrick Fries    Benign prostatic hypertrophy    (sees Dr. Chester Holstein   CAD (coronary artery disease)    a. s/p CABG 2007. b. Cath 08/2016 - 4/4 patent grafts.   Carotid bruit    carotid u/s 10/10: 0.39% bilaterally   Chronic combined systolic and diastolic CHF (congestive heart failure) (HCC)    Complication of anesthesia 1980s   "w/anal cyst OR, he gave me a saddle block then put a narcotic in spinal cord; had a severe reaction to that" (01/07/2017)   Congestive heart failure (CHF) [facility])    ED (erectile dysfunction)    Family history of adverse reaction to anesthesia    "daughter wakes up during OR" (01/07/2017)   GERD (gastroesophageal reflux disease)    Gout    HTN (hypertension)     Hx of colonic polyps    (sees Dr. Marina Goodell)   Hyperlipidemia    Hypothyroidism    Moderate to severe aortic stenosis    a. s/p TAVR 02/2017.   Myocardial infarction [facility]) ~ 2000   Obesity    Osteoarthritis    "was in my knees, hands" (01/07/2017 )   PAF (paroxysmal atrial fibrillation) (HCC)    a. documented post TAVR.   Precancerous skin lesion    (sees Dr. 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   29 mm Edwards Sapien 3 transcatheter heart valve placed via percutaneous right transfemoral approach    Past Surgical History:  Procedure Laterality Date   ANUS SURGERY     "opened it back up cause it wouldn't heal; wound up w/a fissure" (01/07/2017)   BIV ICD INSERTION CRT-D N/A 05/02/2017   Procedure: BIV ICD INSERTION CRT-D;  Surgeon: Duke Salvia, MD;  Location: [facility] INVASIVE CV LAB;  Service: Cardiovascular;  Laterality: N/A;   CARDIAC CATHETERIZATION  10/29/2005   CARDIAC CATHETERIZATION N/A 08/27/2016   Procedure: Right/Left Heart Cath and Coronary/Graft Angiography;  Surgeon: Kathleene Hazel, MD;  Location: [facility] INVASIVE CV LAB;  Service: Cardiovascular;  Laterality: N/A;   CARDIOVERSION N/A 04/24/2021   Procedure: CARDIOVERSION;  Surgeon: Pricilla Riffle, MD;  Location: [facility] ENDOSCOPY;  Service: Cardiovascular;  Laterality: N/A;   CARDIOVERSION N/A 11/08/2021   Procedure: CARDIOVERSION;  Surgeon: Chrystie Nose, MD;  Location: [facility] ENDOSCOPY;  Service: Cardiovascular;  Laterality: N/A;   CARDIOVERSION N/A 02/06/2022   Procedure: CARDIOVERSION;  Surgeon: Wendall Stade, MD;  Location: [facility] ENDOSCOPY;  Service: Cardiovascular;  Laterality: N/A;   CATARACT EXTRACTION W/ INTRAOCULAR LENS  IMPLANT, BILATERAL Bilateral    CATARACT EXTRACTION, BILATERAL  2012   COLONOSCOPY  06/30/2008   no repeats needed    COLONOSCOPY     had 3 or 4 in the past    CORONARY ARTERY BYPASS GRAFT  2007   "CABG X4"    CYST EXCISION PERINEAL  1980s   HAMMER TOE SURGERY Bilateral    JOINT REPLACEMENT     KNEE ARTHROPLASTY  07/30/2011   Procedure: COMPUTER ASSISTED TOTAL KNEE ARTHROPLASTY;  Surgeon: Cammy Copa;  Location: [facility] OR;  Service: Orthopedics;  Laterality: Left;  left total knee arthroplasty   MASTECTOMY SUBCUTANEOUS Bilateral    MULTIPLE TOOTH EXTRACTIONS     ORIF FINGER / THUMB FRACTURE Right ~ 1980   "repair of thumb injury"   PROSTATE BIOPSY     REPLACEMENT TOTAL KNEE BILATERAL Bilateral 2012   TEE WITHOUT CARDIOVERSION N/A 03/04/2017   Procedure: TRANSESOPHAGEAL ECHOCARDIOGRAM (TEE);  Surgeon: Kathleene Hazel, MD;  Location: [facility] OR;  Service: Open Heart Surgery;  Laterality: N/A;   TOTAL KNEE REVISION Right 05/18/2019   Procedure: RIGHT PATELLA REVISION/REMOVAL;  Surgeon: Cammy Copa, MD;  Location: [facility] OR;  Service: Orthopedics;  Laterality: Right;   TRANSCATHETER AORTIC VALVE REPLACEMENT, TRANSFEMORAL N/A 03/04/2017   Procedure: TRANSCATHETER AORTIC VALVE REPLACEMENT, TRANSFEMORAL;  Surgeon: Kathleene Hazel, MD;  Location: [facility] OR;  Service: Open Heart Surgery;  Laterality: N/A;    Family History  Problem Relation Age of Onset   Heart attack Father 47   Allergic rhinitis Father    Asthma Father    Heart failure Mother 87   Uterine cancer Mother    Breast cancer Mother    Colon cancer Neg Hx    Esophageal cancer Neg Hx      Current Outpatient Medications:    allopurinol (ZYLOPRIM) 100 MG tablet, Take 1 tablet (100 mg total) by mouth daily., Disp: 90 tablet, Rfl: 3   amiodarone (PACERONE) 200 MG tablet, Take 2 tablets by mouth twice daily x 2 weeks then decrease to 2 tablets by mouth daily x 2 weeks (Patient taking differently: Take 200 mg by mouth daily.), Disp: 84 tablet, Rfl: 0   aspirin 81 MG tablet, Take 1 tablet (81 mg total) by mouth daily., Disp: 30 tablet, Rfl: 0   atorvastatin (LIPITOR) 20 MG tablet, TAKE ONE TABLET ONCE DAILY, Disp: 90 tablet, Rfl:  3   benzonatate (TESSALON) 200 MG capsule, Take 1 capsule (200 mg total) by mouth 2 (two) times daily as needed for cough., Disp: 20 capsule, Rfl: 0   cetirizine (ZYRTEC) 10 MG tablet, Take 10 mg by mouth 2 (two) times daily. [employer], Disp: , Rfl:    colchicine 0.6 MG tablet, Take 1 tablet (0.6 mg total) by mouth every 6 (six) hours as needed (gout)., Disp: 60 tablet, Rfl: 5   dabigatran (PRADAXA) 150 MG CAPS capsule, TAKE 1 CAPSULE BY MOUTH TWICE  DAILY, Disp: 180 capsule, Rfl: 3   dapagliflozin propanediol (FARXIGA) 10 MG TABS tablet, Take 1 tablet (10 mg total) by mouth daily before breakfast., Disp: 30 tablet, Rfl: 5   diphenhydrAMINE (  BENADRYL) 25 MG tablet, Take 25 mg by mouth daily as needed for allergies., Disp: , Rfl:    fluorouracil (EFUDEX) 5 % cream, Apply 1 application  topically 2 (two) times daily as needed (rash)., Disp: , Rfl:    isosorbide mononitrate (IMDUR) 30 MG 24 hr tablet, TAKE ONE TABLET ONCE DAILY, Disp: 90 tablet, Rfl: 0   ketotifen (ZADITOR) 0.025 % ophthalmic solution, Apply 1 drop to eye daily as needed (itchy eyes). , Disp: , Rfl:    levothyroxine (SYNTHROID) 200 MCG tablet, Take 1 tablet (200 mcg total) by mouth daily., Disp: 90 tablet, Rfl: 3   methocarbamol (ROBAXIN) 500 MG tablet, Take 1 tablet (500 mg total) by mouth 4 (four) times daily. (Patient taking differently: Take 500 mg by mouth every 6 (six) hours as needed for muscle spasms.), Disp: 40 tablet, Rfl: 0   metoCLOPramide (REGLAN) 10 MG tablet, TAKE ONE TABLET BY MOUTH EVERY MORNING WITH BREAKFAST, Disp: 30 tablet, Rfl: 3   metolazone (ZAROXOLYN) 2.5 MG tablet, Take 1 tablet (2.5 mg total) by mouth once a week. Take 1 tablet every Saturday, Disp: 4 tablet, Rfl: 3   metoprolol succinate (TOPROL-XL) 50 MG 24 hr tablet, Take 1.5 tablets (75 mg total) by mouth daily. Take with or immediately following a meal., Disp: 135 tablet, Rfl: 2   montelukast (SINGULAIR) 10 MG tablet, TAKE ONE TABLET BY MOUTH ONCE  DAILY IN THE EVENING, Disp: 90 tablet, Rfl: 3   Multiple Minerals-Vitamins (CAL MAG ZINC +D3 PO), Take 2 tablets by mouth daily. 500 mg / 7 mg / 25 mcg, Disp: , Rfl:    Multiple Vitamin (MULTIVITAMIN) capsule, Take 2 capsules by mouth daily. gummy, Disp: , Rfl:    Multiple Vitamins-Minerals (ICAPS AREDS FORMULA PO), Take 1 tablet by mouth 2 (two) times daily., Disp: , Rfl:    omeprazole (PRILOSEC) 20 MG capsule, TAKE ONE CAPSULE TWICE DAILY, Disp: 180 capsule, Rfl: 0   potassium chloride SA (KLOR-CON M) 20 MEQ tablet, Take 1 tablet (20 mEq total) by mouth 2 (two) times daily., Disp: 180 tablet, Rfl: 3   sertraline (ZOLOFT) 50 MG tablet, Take 1 tablet (50 mg total) by mouth daily., Disp: 30 tablet, Rfl: 2   spironolactone (ALDACTONE) 25 MG tablet, Take 1 tablet (25 mg total) by mouth daily., Disp: 90 tablet, Rfl: 3   torsemide (DEMADEX) 20 MG tablet, Take 2 tablets (40 mg total) every other day, alternating with 1 tablet (20 mg total) every other day. (Patient taking differently: Take 20-40 mg by mouth See admin instructions. Take 2 tablets (40 mg total) every other day, alternating with 1 tablet (20 mg total) every other day.), Disp: 180 tablet, Rfl: 3   triamcinolone cream (KENALOG) 0.1 %, Apply 1 application  topically daily as needed for dry skin., Disp: , Rfl:    valACYclovir (VALTREX) 500 MG tablet, Take 500 mg by mouth 2 (two) times daily as needed (cold sores)., Disp: , Rfl:   EXAM:  VITALS per patient if applicable:  GENERAL: alert, oriented, appears well and in no acute distress  HEENT: atraumatic, conjunttiva clear, no obvious abnormalities on inspection of external nose and ears  NECK: normal movements of the head and neck  LUNGS: on inspection no signs of respiratory distress, breathing rate appears normal, no obvious gross SOB, gasping or wheezing  CV: no obvious cyanosis  MS: moves all visible extremities without noticeable abnormality  PSYCH/NEURO: pleasant and cooperative,  no obvious depression or anxiety, speech and thought processing grossly  intact  ASSESSMENT AND PLAN: Covid infection, treat with a Zpack. Use Benzonatate and albuterol inhaler as needed.  Gershon Crane, MD  Discussed the following assessment and plan:  No diagnosis found.     I discussed the assessment and treatment plan with the patient. The patient was provided an opportunity to ask questions and all were answered. The patient agreed with the plan and demonstrated an understanding of the instructions.   The patient was advised to call back or seek an in-person evaluation if the symptoms worsen or if the condition fails to improve as anticipated.      Review of Systems     Objective:   Physical Exam        Assessment & Plan:

## 2023-05-06 NOTE — Telephone Encounter (Signed)
Please advise 

## 2023-05-06 NOTE — Telephone Encounter (Signed)
Justin [name] from [facility] called and stated the antibiotic that was sent in today would interact with the heart medication, wants to know if it needs to be changed or will it be okay for pt to take?

## 2023-05-08 LAB — URINALYSIS
Bilirubin Urine: NEGATIVE
Hgb urine dipstick: NEGATIVE
Ketones, ur: NEGATIVE
Leukocytes,Ua: NEGATIVE
Nitrite: NEGATIVE
Specific Gravity, Urine: 1.02 (ref 1.000–1.030)
Total Protein, Urine: NEGATIVE
Urine Glucose: NEGATIVE
Urobilinogen, UA: 0.2 (ref 0.0–1.0)
pH: 7 (ref 5.0–8.0)

## 2023-05-08 NOTE — Telephone Encounter (Signed)
Spoke with Nehemiah Settle at [employer], informed of the med change, stated she will get it ready and let pt know.

## 2023-05-12 ENCOUNTER — Encounter: Payer: Self-pay | Admitting: Family Medicine

## 2023-05-14 MED ORDER — SILVER SULFADIAZINE 1 % EX CREA: 1.0000 | TOPICAL_CREAM | Freq: Two times a day (BID) | CUTANEOUS | 5 refills | Status: DC

## 2023-05-14 NOTE — Telephone Encounter (Signed)
Done

## 2023-05-15 ENCOUNTER — Ambulatory Visit (INDEPENDENT_AMBULATORY_CARE_PROVIDER_SITE_OTHER): Payer: Medicare Other

## 2023-05-15 ENCOUNTER — Ambulatory Visit: Payer: Medicare Other

## 2023-05-15 DIAGNOSIS — I255 Ischemic cardiomyopathy: Secondary | ICD-10-CM

## 2023-05-19 DIAGNOSIS — N3000 Acute cystitis without hematuria: Secondary | ICD-10-CM | POA: Diagnosis not present

## 2023-05-19 LAB — CUP PACEART REMOTE DEVICE CHECK
Battery Remaining Longevity: 1 mo — CL
Battery Voltage: 2.63 V
Brady Statistic RA Percent Paced: 0.41 %
Brady Statistic RV Percent Paced: 74.47 %
Date Time Interrogation Session: 20240923061034
HighPow Impedance: 83 Ohm
Implantable Lead Connection Status: 753985
Implantable Lead Connection Status: 753985
Implantable Lead Connection Status: 753985
Implantable Lead Implant Date: 20180907
Implantable Lead Implant Date: 20180907
Implantable Lead Implant Date: 20180907
Implantable Lead Location: 753858
Implantable Lead Location: 753859
Implantable Lead Location: 753860
Implantable Lead Model: 4396
Implantable Lead Model: 5076
Implantable Pulse Generator Implant Date: 20180907
Lead Channel Impedance Value: 1026 Ohm
Lead Channel Impedance Value: 342 Ohm
Lead Channel Impedance Value: 380 Ohm
Lead Channel Impedance Value: 399 Ohm
Lead Channel Impedance Value: 494 Ohm
Lead Channel Impedance Value: 722 Ohm
Lead Channel Pacing Threshold Amplitude: 0.875 V
Lead Channel Pacing Threshold Amplitude: 1.125 V
Lead Channel Pacing Threshold Amplitude: 1.625 V
Lead Channel Pacing Threshold Pulse Width: 0.4 ms
Lead Channel Pacing Threshold Pulse Width: 0.4 ms
Lead Channel Pacing Threshold Pulse Width: 1 ms
Lead Channel Sensing Intrinsic Amplitude: 1 mV
Lead Channel Sensing Intrinsic Amplitude: 1 mV
Lead Channel Sensing Intrinsic Amplitude: 12.875 mV
Lead Channel Sensing Intrinsic Amplitude: 12.875 mV
Lead Channel Setting Pacing Amplitude: 1.75 V
Lead Channel Setting Pacing Amplitude: 2.25 V
Lead Channel Setting Pacing Amplitude: 2.5 V
Lead Channel Setting Pacing Pulse Width: 0.4 ms
Lead Channel Setting Pacing Pulse Width: 1 ms
Lead Channel Setting Sensing Sensitivity: 0.3 mV
Zone Setting Status: 755011

## 2023-05-23 ENCOUNTER — Ambulatory Visit: Payer: Medicare Other | Attending: Internal Medicine

## 2023-05-23 DIAGNOSIS — I5042 Chronic combined systolic (congestive) and diastolic (congestive) heart failure: Secondary | ICD-10-CM

## 2023-05-24 LAB — CBC
Hematocrit: 45.2 % (ref 37.5–51.0)
Hemoglobin: 14 g/dL (ref 13.0–17.7)
MCH: 26.9 pg (ref 26.6–33.0)
MCHC: 31 g/dL — ABNORMAL LOW (ref 31.5–35.7)
MCV: 87 fL (ref 79–97)
Platelets: 415 10*3/uL (ref 150–450)
RBC: 5.21 x10E6/uL (ref 4.14–5.80)
RDW: 15.9 % — ABNORMAL HIGH (ref 11.6–15.4)
WBC: 16 10*3/uL — ABNORMAL HIGH (ref 3.4–10.8)

## 2023-05-24 LAB — BASIC METABOLIC PANEL
BUN/Creatinine Ratio: 24 (ref 10–24)
BUN: 29 mg/dL — ABNORMAL HIGH (ref 8–27)
CO2: 30 mmol/L — ABNORMAL HIGH (ref 20–29)
Calcium: 8.5 mg/dL — ABNORMAL LOW (ref 8.6–10.2)
Chloride: 90 mmol/L — ABNORMAL LOW (ref 96–106)
Creatinine, Ser: 1.21 mg/dL (ref 0.76–1.27)
Glucose: 85 mg/dL (ref 70–99)
Potassium: 3.6 mmol/L (ref 3.5–5.2)
Sodium: 136 mmol/L (ref 134–144)
eGFR: 58 mL/min/{1.73_m2} — ABNORMAL LOW (ref >59)

## 2023-05-24 LAB — SPECIMEN STATUS REPORT

## 2023-05-26 ENCOUNTER — Encounter: Payer: Medicare Other | Admitting: Internal Medicine

## 2023-05-27 NOTE — Progress Notes (Signed)
Remote ICD transmission.   

## 2023-05-27 NOTE — Addendum Note (Signed)
Addended by: Elease Etienne A on: 05/27/2023 10:49 AM   Modules accepted: Level of Service

## 2023-05-29 NOTE — Pre-Procedure Instructions (Signed)
Instructed patient on the following items: Arrival time 0515 Nothing to eat or drink after midnight No meds AM of procedure Responsible person to drive you home and stay with you for 24 hrs Wash with special soap night before and morning of procedure If on anti-coagulant drug instructions Pradaxa- last dose 10/1

## 2023-05-30 ENCOUNTER — Ambulatory Visit (HOSPITAL_COMMUNITY)
Admission: RE | Admit: 2023-05-30 | Discharge: 2023-05-30 | Disposition: A | Discharge status: Home / Self Care | Payer: Medicare Other | Attending: Internal Medicine | Admitting: Internal Medicine

## 2023-05-30 ENCOUNTER — Encounter (HOSPITAL_COMMUNITY)
Admission: RE | Disposition: A | Discharge status: Home / Self Care | Payer: Self-pay | Source: Home / Self Care | Attending: Internal Medicine

## 2023-05-30 ENCOUNTER — Other Ambulatory Visit: Payer: Self-pay | Admitting: Internal Medicine

## 2023-05-30 ENCOUNTER — Other Ambulatory Visit: Payer: Self-pay

## 2023-05-30 DIAGNOSIS — Z4502 Encounter for adjustment and management of automatic implantable cardiac defibrillator: Secondary | ICD-10-CM | POA: Diagnosis not present

## 2023-05-30 DIAGNOSIS — Z951 Presence of aortocoronary bypass graft: Secondary | ICD-10-CM | POA: Insufficient documentation

## 2023-05-30 DIAGNOSIS — Z9013 Acquired absence of bilateral breasts and nipples: Secondary | ICD-10-CM | POA: Insufficient documentation

## 2023-05-30 DIAGNOSIS — I472 Ventricular tachycardia, unspecified: Secondary | ICD-10-CM | POA: Insufficient documentation

## 2023-05-30 DIAGNOSIS — I447 Left bundle-branch block, unspecified: Secondary | ICD-10-CM | POA: Diagnosis not present

## 2023-05-30 DIAGNOSIS — N289 Disorder of kidney and ureter, unspecified: Secondary | ICD-10-CM | POA: Diagnosis not present

## 2023-05-30 DIAGNOSIS — I5042 Chronic combined systolic (congestive) and diastolic (congestive) heart failure: Secondary | ICD-10-CM | POA: Insufficient documentation

## 2023-05-30 DIAGNOSIS — I110 Hypertensive heart disease with heart failure: Secondary | ICD-10-CM | POA: Diagnosis not present

## 2023-05-30 DIAGNOSIS — Z96653 Presence of artificial knee joint, bilateral: Secondary | ICD-10-CM | POA: Insufficient documentation

## 2023-05-30 DIAGNOSIS — Z87891 Personal history of nicotine dependence: Secondary | ICD-10-CM | POA: Diagnosis not present

## 2023-05-30 DIAGNOSIS — I4821 Permanent atrial fibrillation: Secondary | ICD-10-CM | POA: Insufficient documentation

## 2023-05-30 DIAGNOSIS — Z7984 Long term (current) use of oral hypoglycemic drugs: Secondary | ICD-10-CM | POA: Insufficient documentation

## 2023-05-30 DIAGNOSIS — I495 Sick sinus syndrome: Secondary | ICD-10-CM

## 2023-05-30 DIAGNOSIS — Z79899 Other long term (current) drug therapy: Secondary | ICD-10-CM

## 2023-05-30 DIAGNOSIS — Z952 Presence of prosthetic heart valve: Secondary | ICD-10-CM | POA: Insufficient documentation

## 2023-05-30 DIAGNOSIS — Z95810 Presence of automatic (implantable) cardiac defibrillator: Secondary | ICD-10-CM

## 2023-05-30 DIAGNOSIS — Z8249 Family history of ischemic heart disease and other diseases of the circulatory system: Secondary | ICD-10-CM | POA: Insufficient documentation

## 2023-05-30 DIAGNOSIS — Z6841 Body Mass Index (BMI) 40.0 and over, adult: Secondary | ICD-10-CM | POA: Diagnosis not present

## 2023-05-30 HISTORY — PX: ICD GENERATOR CHANGEOUT: EP1231

## 2023-05-30 SURGERY — ICD GENERATOR CHANGEOUT
Anesthesia: LOCAL

## 2023-05-30 MED ORDER — ACETAMINOPHEN 325 MG PO TABS: 325.0000 mg | ORAL_TABLET | ORAL | Status: DC | PRN

## 2023-05-30 MED ORDER — FENTANYL CITRATE (PF) 100 MCG/2ML IJ SOLN
INTRAMUSCULAR | Status: AC
  Filled 2023-05-30: qty 2

## 2023-05-30 MED ORDER — CEFAZOLIN SODIUM-DEXTROSE 2-4 GM/100ML-% IV SOLN: 2.0000 g | INTRAVENOUS | Status: AC

## 2023-05-30 MED ORDER — SODIUM CHLORIDE 0.9 % IV SOLN
80.0000 mg | INTRAVENOUS | Status: AC
  Administered 2023-05-30: 80 mg

## 2023-05-30 MED ORDER — METOPROLOL SUCCINATE ER 50 MG PO TB24: 100.0000 mg | ORAL_TABLET | Freq: Every day | ORAL | 2 refills | Status: DC

## 2023-05-30 MED ORDER — MIDAZOLAM HCL 5 MG/5ML IJ SOLN
INTRAMUSCULAR | Status: DC | PRN
  Administered 2023-05-30 (×3): 1 mg via INTRAVENOUS

## 2023-05-30 MED ORDER — CEFAZOLIN SODIUM-DEXTROSE 2-4 GM/100ML-% IV SOLN
INTRAVENOUS | Status: AC
  Administered 2023-05-30: 2 g via INTRAVENOUS
  Filled 2023-05-30: qty 100

## 2023-05-30 MED ORDER — SODIUM CHLORIDE 0.9 % IV SOLN: INTRAVENOUS | Status: DC

## 2023-05-30 MED ORDER — FENTANYL CITRATE (PF) 100 MCG/2ML IJ SOLN
INTRAMUSCULAR | Status: DC | PRN
  Administered 2023-05-30 (×2): 25 ug via INTRAVENOUS

## 2023-05-30 MED ORDER — DIGOXIN 125 MCG PO TABS: 125.0000 ug | ORAL_TABLET | ORAL | 3 refills | Status: DC

## 2023-05-30 MED ORDER — LIDOCAINE HCL (PF) 1 % IJ SOLN
INTRAMUSCULAR | Status: DC | PRN
  Administered 2023-05-30: 60 mL

## 2023-05-30 MED ORDER — SODIUM CHLORIDE 0.9 % IV SOLN
INTRAVENOUS | Status: AC
  Filled 2023-05-30: qty 2

## 2023-05-30 MED ORDER — LIDOCAINE HCL 1 % IJ SOLN
INTRAMUSCULAR | Status: AC
  Filled 2023-05-30: qty 60

## 2023-05-30 MED ORDER — MIDAZOLAM HCL 5 MG/5ML IJ SOLN
INTRAMUSCULAR | Status: AC
  Filled 2023-05-30: qty 5

## 2023-05-30 MED ORDER — CHLORHEXIDINE GLUCONATE 4 % EX SOLN
4.0000 | Freq: Once | CUTANEOUS | Status: AC
  Administered 2023-05-30: 4 via TOPICAL
  Filled 2023-05-30: qty 60

## 2023-05-30 MED ORDER — SPIRONOLACTONE 25 MG PO TABS: 12.5000 mg | ORAL_TABLET | Freq: Every day | ORAL | 3 refills | Status: DC

## 2023-05-30 SURGICAL SUPPLY — 5 items
CABLE SURGICAL S-101-97-12 (CABLE) ×1 IMPLANT
ICD COBALT XT CRT DTPA2D4 (ICD Generator) IMPLANT
KIT WRENCH (KITS) IMPLANT
PAD DEFIB RADIO PHYSIO CONN (PAD) ×1 IMPLANT
TRAY PACEMAKER INSERTION (PACKS) ×1 IMPLANT

## 2023-05-30 NOTE — H&P (Signed)
Patient Care Team: Nelwyn Salisbury, MD as PCP - General Clif[place] James Nile Dear, MD as PCP - Cardiology (Cardiology) Duke Salvia, MD as PCP - Electrophysiology (Cardiology) Jerilee Field, MD as Consulting Physician (Urology) Verner Chol, [facility] [place] (Inactive) as Pharmacist (Pharmacist)   HPI  Justin [name] is a 89 y.o. male admitted for CRT generator replacement  Stable symptoms   Now permanent AFib, Prior TAVR  VTach on amio, tolerating       DATE TEST EF    1/18 LHC   Patent grafts  8/18 Echo   40-45 %    6/19 Echo   40-45 %    9/20 Echo  35-40%    1/23 Echo 40-45%    3/24 Echo  40-45%        DATE Cr K Hgb TSH LFTs  11/22  1.08 4.7 13.0(8/22)        3/23 1.3 4.3 14.6 4.72(4/23) 9(4/23)  }8/23 1.58 4.5 13.2      3/24 1.81<<2.37 4.3 13.2      8/24    0.844 23       Records and Results Reviewed   Past Medical History:  Diagnosis Date   Age-related macular degeneration, dry, both eyes    Allergic    "24/7; 365 days/year; I'm allergic to pollens, dust, all southern grasses/trees, mold, mildue, cats, dogs" (01/07/2017)   Anal fissure    Asthma    sees Dr. Kendrick Fries    Benign prostatic hypertrophy    (sees Dr. Chester Holstein   CAD (coronary artery disease)    a. s/p CABG 2007. b. Cath 08/2016 - 4/4 patent grafts.   Carotid bruit    carotid u/s 10/10: 0.39% bilaterally   Chronic combined systolic and diastolic CHF (congestive heart failure) (HCC)    Complication of anesthesia 1980s   "w/anal cyst OR, he gave me a saddle block then put a narcotic in spinal cord; had a severe reaction to that" (01/07/2017)   Congestive heart failure (CHF) [facility])    ED (erectile dysfunction)    Family history of adverse reaction to anesthesia    "daughter wakes up during OR" (01/07/2017)   GERD (gastroesophageal reflux disease)    Gout    HTN (hypertension)    Hx of colonic polyps    (sees Dr. Marina Goodell)   Hyperlipidemia    Hypothyroidism    Moderate to severe aortic  stenosis    a. s/p TAVR 02/2017.   Myocardial infarction [facility]) ~ 2000   Obesity    Osteoarthritis    "was in my knees, hands" (01/07/2017 )   PAF (paroxysmal atrial fibrillation) (HCC)    a. documented post TAVR.   Precancerous skin lesion    (sees Dr. 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   29 mm Edwards Sapien 3 transcatheter heart valve placed via percutaneous right transfemoral approach    Past Surgical History:  Procedure Laterality Date   ANUS SURGERY     "opened it back up cause it wouldn't heal; wound up w/a fissure" (01/07/2017)   BIV ICD INSERTION CRT-D N/A 05/02/2017   Procedure: BIV ICD INSERTION CRT-D;  Surgeon: Duke Salvia, MD;  Location: [facility] INVASIVE CV LAB;  Service: Cardiovascular;  Laterality: N/A;   CARDIAC CATHETERIZATION  10/29/2005   CARDIAC CATHETERIZATION N/A 08/27/2016   Procedure: Right/Left Heart Cath and Coronary/Graft Angiography;  Surgeon: Kathleene Hazel, MD;  Location: [facility] INVASIVE CV LAB;  Service: Cardiovascular;  Laterality: N/A;   CARDIOVERSION N/A 04/24/2021   Procedure: CARDIOVERSION;  Surgeon: Pricilla Riffle, MD;  Location: [facility] ENDOSCOPY;  Service: Cardiovascular;  Laterality: N/A;   CARDIOVERSION N/A 11/08/2021   Procedure: CARDIOVERSION;  Surgeon: Chrystie Nose, MD;  Location: [facility] ENDOSCOPY;  Service: Cardiovascular;  Laterality: N/A;   CARDIOVERSION N/A 02/06/2022   Procedure: CARDIOVERSION;  Surgeon: Wendall Stade, MD;  Location: [facility] ENDOSCOPY;  Service: Cardiovascular;  Laterality: N/A;   CATARACT EXTRACTION W/ INTRAOCULAR LENS  IMPLANT, BILATERAL Bilateral    CATARACT EXTRACTION, BILATERAL  2012   COLONOSCOPY  06/30/2008   no repeats needed    COLONOSCOPY     had 3 or 4 in the past    CORONARY ARTERY BYPASS GRAFT  2007   "CABG X4"   CYST EXCISION PERINEAL  1980s   HAMMER TOE SURGERY Bilateral    JOINT REPLACEMENT     KNEE ARTHROPLASTY   07/30/2011   Procedure: COMPUTER ASSISTED TOTAL KNEE ARTHROPLASTY;  Surgeon: Cammy Copa;  Location: [facility] OR;  Service: Orthopedics;  Laterality: Left;  left total knee arthroplasty   MASTECTOMY SUBCUTANEOUS Bilateral    MULTIPLE TOOTH EXTRACTIONS     ORIF FINGER / THUMB FRACTURE Right ~ 1980   "repair of thumb injury"   PROSTATE BIOPSY     REPLACEMENT TOTAL KNEE BILATERAL Bilateral 2012   TEE WITHOUT CARDIOVERSION N/A 03/04/2017   Procedure: TRANSESOPHAGEAL ECHOCARDIOGRAM (TEE);  Surgeon: Kathleene Hazel, MD;  Location: [facility] OR;  Service: Open Heart Surgery;  Laterality: N/A;   TOTAL KNEE REVISION Right 05/18/2019   Procedure: RIGHT PATELLA REVISION/REMOVAL;  Surgeon: Cammy Copa, MD;  Location: [facility] OR;  Service: Orthopedics;  Laterality: Right;   TRANSCATHETER AORTIC VALVE REPLACEMENT, TRANSFEMORAL N/A 03/04/2017   Procedure: TRANSCATHETER AORTIC VALVE REPLACEMENT, TRANSFEMORAL;  Surgeon: Kathleene Hazel, MD;  Location: [facility] OR;  Service: Open Heart Surgery;  Laterality: N/A;    Current Facility-Administered Medications  Medication Dose Route Frequency Provider Last Rate Last Admin   0.9 %  sodium chloride infusion   Intravenous Continuous Graciella Freer, PA-C 50 mL/hr at 05/30/23 0618 New Bag at 05/30/23 0618   ceFAZolin (ANCEF) 2-4 GM/100ML-% IVPB            ceFAZolin (ANCEF) IVPB 2g/100 mL premix  2 g Intravenous On Call Graciella Freer, PA-C       gentamicin (GARAMYCIN) 80 mg in sodium chloride 0.9 % 500 mL irrigation  80 mg Irrigation On Call Graciella Freer, PA-C       sodium chloride 0.9 % with gentamicin (GARAMYCIN) ADS Med             Allergies  Allergen Reactions   Peanut-Containing Drug Products Anaphylaxis   Sulfonamide Derivatives Anaphylaxis   Amlodipine Swelling    Swelling in ankles   Eliquis [Apixaban] Other (See Comments)    Back/hip pain   Lisinopril Cough    cough   Xarelto [Rivaroxaban] Other (See Comments)     Back/hip pain      Social History   Tobacco Use   Smoking status: Former    Current packs/day: 0.00    Average packs/day: 3.5 packs/day for 13.0 years (45.5 ttl pk-yrs)    Types: Cigarettes    Start date: 16    Quit date: 1963    Years since quitting: 61.8   Smokeless tobacco: Never   Tobacco comments:  Former smoker 04/19/21  Vaping Use   Vaping status: Never Used  Substance Use Topics   Alcohol use: Not Currently    Alcohol/week: 7.0 standard drinks of alcohol    Types: 7 Cans of beer per week    Comment: 1 beer daily after 5pm (04/19/21)   Drug use: No     Family History  Problem Relation Age of Onset   Heart attack Father 68   Allergic rhinitis Father    Asthma Father    Heart failure Mother 43   Uterine cancer Mother    Breast cancer Mother    Colon cancer Neg Hx    Esophageal cancer Neg Hx      Current Meds  Medication Sig   albuterol (VENTOLIN HFA) 108 (90 Base) MCG/ACT inhaler Inhale 2 puffs into the lungs every 4 (four) hours as needed for wheezing or shortness of breath.   amiodarone (PACERONE) 200 MG tablet Take 2 tablets by mouth twice daily x 2 weeks then decrease to 2 tablets by mouth daily x 2 weeks (Patient taking differently: Take 200 mg by mouth daily.)   aspirin 81 MG tablet Take 1 tablet (81 mg total) by mouth daily.   atorvastatin (LIPITOR) 20 MG tablet TAKE ONE TABLET ONCE DAILY   azithromycin (ZITHROMAX Z-PAK) 250 MG tablet As directed   benzonatate (TESSALON) 200 MG capsule Take 1 capsule (200 mg total) by mouth 2 (two) times daily as needed for cough.   cetirizine (ZYRTEC) 10 MG tablet Take 10 mg by mouth 2 (two) times daily. [employer]/ [facility]   colchicine 0.6 MG tablet Take 1 tablet (0.6 mg total) by mouth every 6 (six) hours as needed (gout).   dabigatran (PRADAXA) 150 MG CAPS capsule TAKE 1 CAPSULE BY MOUTH TWICE  DAILY   dapagliflozin propanediol (FARXIGA) 10 MG TABS tablet Take 1 tablet (10 mg total) by mouth daily before breakfast.    isosorbide mononitrate (IMDUR) 30 MG 24 hr tablet TAKE ONE TABLET ONCE DAILY   levothyroxine (SYNTHROID) 200 MCG tablet Take 1 tablet (200 mcg total) by mouth daily.   metolazone (ZAROXOLYN) 2.5 MG tablet Take 1 tablet (2.5 mg total) by mouth once a week. Take 1 tablet every Saturday   metoprolol succinate (TOPROL-XL) 50 MG 24 hr tablet Take 1.5 tablets (75 mg total) by mouth daily. Take with or immediately following a meal.   montelukast (SINGULAIR) 10 MG tablet TAKE ONE TABLET BY MOUTH ONCE DAILY IN THE EVENING   Multiple Minerals-Vitamins (CAL MAG ZINC +D3 PO) Take 2 tablets by mouth daily. 500 mg / 7 mg / 25 mcg   Multiple Vitamin (MULTIVITAMIN) capsule Take 2 capsules by mouth daily. gummy   Multiple Vitamins-Minerals (ICAPS AREDS FORMULA PO) Take 1 tablet by mouth 2 (two) times daily.   omeprazole (PRILOSEC) 20 MG capsule TAKE ONE CAPSULE TWICE DAILY   potassium chloride SA (KLOR-CON M) 20 MEQ tablet Take 1 tablet (20 mEq total) by mouth 2 (two) times daily.   sertraline (ZOLOFT) 50 MG tablet Take 1 tablet (50 mg total) by mouth daily.   silver sulfADIAZINE (SILVADENE) 1 % cream Apply 1 Application topically 2 (two) times daily.   spironolactone (ALDACTONE) 25 MG tablet Take 1 tablet (25 mg total) by mouth daily.   torsemide (DEMADEX) 20 MG tablet Take 2 tablets (40 mg total) every other day, alternating with 1 tablet (20 mg total) every other day. (Patient taking differently: Take 20-40 mg by mouth See admin instructions. Take 2 tablets (  40 mg total) every other day, alternating with 1 tablet (20 mg total) every other day.)   [DISCONTINUED] benzonatate (TESSALON) 200 MG capsule Take 1 capsule (200 mg total) by mouth 2 (two) times daily as needed for cough.     Review of Systems negative except from HPI and PMH  Physical Exam BP 125/65 (BP Location: Right Arm)   Pulse 77   Temp 97.8 F (36.6 C) (Oral)   Resp 18   Ht 5\' 6"  (1.676 m)   Wt 117.5 kg   SpO2 96%   BMI 41.80 kg/m  Well  developed and well nourished in no acute distress HENT normal E scleral and icterus clear Neck Supple JVP flat; carotids brisk and full Clear to ausculation Regular rate and rhythm, no murmurs gallops or rub Soft with active bowel sounds No clubbing cyanosis  Edema Alert and oriented, grossly normal motor and sensory function Skin Warm and Dry    Assessment and  Plan  Ventricular tachycardia- prolonged- nonsustained   PVCs   Atrial fibrillation-permanent   Post TAVR left bundle branch block/IVCD   Cardiomyopathy EF 40-45%   CRT- D Medtronic        CHF chronic systolic/diastolic    Morbidly obese   .  Renal insufficiency Estimated Creatinine Clearance: 37 mL/min (A) (by C-G formula based on SCr of 1.81 mg/dL (H)).    We have reviewed the benefits and risks of generator replacement.  These include but are not limited to lead fracture and infection.  The patient understands, agrees and is willing to proceed.    Prior discussion re [place] therapy to be retained-- he still desires HV therapy    Will increase bb and add dig, for rate control, but will discuss at followup AV ablation

## 2023-05-30 NOTE — Discharge Instructions (Addendum)
Dermabond will come off in next 10-14 days; if not will remove at wound check  Keep wound dry until tomorrow evening No Driving x 4 days   Implantable Cardiac Device Battery Change, Care After  This sheet gives you information about how to care for yourself after your procedure. Your health care provider may also give you more specific instructions. If you have problems or questions, contact your health care provider. What can I expect after the procedure? After your procedure, it is common to have: Pain or soreness at the site where the cardiac device was inserted. Swelling at the site where the cardiac device was inserted. You should received an information card for your new device in 4-8 weeks. Follow these instructions at home: Incision care  Keep the incision clean and dry. Do not take baths, swim, or use a hot tub until after your wound check.  Do not shower for at least 7 days, or as directed by your health care provider. Pat the area dry with a clean towel. Do not rub the area. This may cause bleeding. Follow instructions from your health care provider about how to take care of your incision. Make sure you: Check your incision area every day for signs of infection. Check for: More redness, swelling, or pain. More fluid or blood. Warmth. Pus or a bad smell. Activity Do not lift anything that is heavier than 10 lb (4.5 kg) until your health care provider says it is okay to do so. For the first week, or as long as told by your health care provider: Avoid lifting your affected arm higher than your shoulder. After 1 week, Be gentle when you move your arms over your head. It is okay to raise your arm to comb your hair. Avoid strenuous exercise. Ask your health care provider when it is okay to: Resume your normal activities. Return to work or school. Resume sexual activity. Eating and drinking Eat a heart-healthy diet. This should include plenty of fresh fruits and  vegetables, whole grains, low-fat dairy products, and lean protein like chicken and fish. Limit alcohol intake to no more than 1 drink a day for non-pregnant women and 2 drinks a day for men. One drink equals 12 oz of beer, 5 oz of wine, or 1 oz of hard liquor. Check ingredients and nutrition facts on packaged foods and beverages. Avoid the following types of food: Food that is high in salt (sodium). Food that is high in saturated fat, like full-fat dairy or red meat. Food that is high in trans fat, like fried food. Food and drinks that are high in sugar. Lifestyle Do not use any products that contain nicotine or tobacco, such as cigarettes and e-cigarettes. If you need help quitting, ask your health care provider. Take steps to manage and control your weight. Once cleared, get regular exercise. Aim for 150 minutes of moderate-intensity exercise (such as walking or yoga) or 75 minutes of vigorous exercise (such as running or swimming) each week. Manage other health problems, such as diabetes or high blood pressure. Ask your health care provider how you can manage these conditions. General instructions Do not drive for 24 hours after your procedure if you were given a medicine to help you relax (sedative). Take over-the-counter and prescription medicines only as told by your health care provider. Avoid putting pressure on the area where the cardiac device was placed. If you need an MRI after your cardiac device has been placed, be sure to  tell the health care provider who orders the MRI that you have a cardiac device. Avoid close and prolonged exposure to electrical devices that have strong magnetic fields. These include: Cell phones. Avoid keeping them in a pocket near the cardiac device, and try using the ear opposite the cardiac device. MP3 players. Household appliances, like microwaves. Metal detectors. Electric generators. High-tension wires. Keep all follow-up visits as directed by your  health care provider. This is important. Contact a health care provider if: You have pain at the incision site that is not relieved by over-the-counter or prescription medicines. You have any of these around your incision site or coming from it: More redness, swelling, or pain. Fluid or blood. Warmth to the touch. Pus or a bad smell. You have a fever. You feel brief, occasional palpitations, light-headedness, or any symptoms that you think might be related to your heart. Get help right away if: You experience chest pain that is different from the pain at the cardiac device site. You develop a red streak that extends above or below the incision site. You experience shortness of breath. You have palpitations or an irregular heartbeat. You have light-headedness that does not go away quickly. You faint or have dizzy spells. Your pulse suddenly drops or increases rapidly and does not return to normal. You begin to gain weight and your legs and ankles swell. Summary After your procedure, it is common to have pain, soreness, and some swelling where the cardiac device was inserted. Make sure to keep your incision clean and dry. Follow instructions from your health care provider about how to take care of your incision. Check your incision every day for signs of infection, such as more pain or swelling, pus or a bad smell, warmth, or leaking fluid and blood. Avoid strenuous exercise and lifting your left arm higher than your shoulder for 2 weeks, or as long as told by your health care provider. This information is not intended to replace advice given to you by your health care provider. Make sure you discuss any questions you have with your health care provider.

## 2023-06-02 ENCOUNTER — Encounter (HOSPITAL_COMMUNITY): Payer: Self-pay | Admitting: Internal Medicine

## 2023-06-03 ENCOUNTER — Other Ambulatory Visit: Payer: Self-pay | Admitting: Cardiovascular Disease

## 2023-06-03 ENCOUNTER — Encounter: Payer: Self-pay | Admitting: Internal Medicine

## 2023-06-03 ENCOUNTER — Encounter: Payer: Self-pay | Admitting: Family Medicine

## 2023-06-03 ENCOUNTER — Encounter: Payer: Self-pay | Admitting: Cardiovascular Disease

## 2023-06-03 ENCOUNTER — Other Ambulatory Visit (HOSPITAL_COMMUNITY): Payer: Self-pay | Admitting: Cardiology

## 2023-06-03 NOTE — Telephone Encounter (Signed)
Symptoms suggest orthostasis The patient's daughter was concerned that there was Sherryll Burger in his medicine collection, my reviewing the notes suggest that it was discontinued months ago and he is not on it.  In light of his orthostatic symptoms, it would not be appropriate to start at this time.  She will measure orthostatic blood pressures tomorrow and let us know.  There is lack of resolution regarding diuresis--continue metalazone 2.5 weekly but will hold furosemide until we get orthostatics

## 2023-06-04 ENCOUNTER — Encounter: Payer: Self-pay | Admitting: Internal Medicine

## 2023-06-04 ENCOUNTER — Encounter: Payer: Self-pay | Admitting: Family Medicine

## 2023-06-04 ENCOUNTER — Encounter: Payer: Self-pay | Admitting: Cardiovascular Disease

## 2023-06-04 NOTE — Telephone Encounter (Signed)
Spoke with patient and his daughter Binnie Kand. Binnie Kand says that she talked to Dr Graciela Husbands last night on the phone and he was supposed to be sending a message to you and or Dr Clifton James regarding patient. Please advise. Does he still need appt with the pharmacist?

## 2023-06-05 ENCOUNTER — Encounter: Payer: Self-pay | Admitting: Internal Medicine

## 2023-06-05 ENCOUNTER — Encounter: Payer: Self-pay | Admitting: Family Medicine

## 2023-06-05 ENCOUNTER — Encounter: Payer: Self-pay | Admitting: Cardiovascular Disease

## 2023-06-05 NOTE — Telephone Encounter (Signed)
FYI

## 2023-06-06 NOTE — Telephone Encounter (Signed)
The medications written by myself are correct, and the ones by Dr. Ardyth Harps are correct (she is my partner). For the ones from the cardiologists, they will need to address these

## 2023-06-10 MED ORDER — ISOSORBIDE MONONITRATE ER 30 MG PO TB24: 30.0000 mg | ORAL_TABLET | Freq: Every day | ORAL | Status: DC

## 2023-06-10 NOTE — Telephone Encounter (Signed)
I am concerned about his low blood pressure.  Lets have him take his isosorbide at night stop the spironolactone and stop his [place] place

## 2023-06-10 NOTE — Progress Notes (Signed)
ICM remote transmission rescheduled to 11/4 to allow time for fluid levels to develop after recent battery change.

## 2023-06-11 DIAGNOSIS — H353221 Exudative age-related macular degeneration, left eye, with active choroidal neovascularization: Secondary | ICD-10-CM | POA: Diagnosis not present

## 2023-06-11 NOTE — Telephone Encounter (Signed)
This is very common for older patients who get up in the middle of the night. The BP suddenly drops, but this is transient. The main thing is for them to GO SLOW. They should sit on the bed for 30 seconds, then when they stand up wait 30 more seconds before they start walking. It would be a good idea to have a walker on hand as well

## 2023-06-12 ENCOUNTER — Ambulatory Visit: Payer: Medicare Other | Attending: Internal Medicine

## 2023-06-12 ENCOUNTER — Ambulatory Visit: Payer: Medicare Other

## 2023-06-12 DIAGNOSIS — I255 Ischemic cardiomyopathy: Secondary | ICD-10-CM

## 2023-06-12 DIAGNOSIS — Z79899 Other long term (current) drug therapy: Secondary | ICD-10-CM | POA: Diagnosis not present

## 2023-06-12 NOTE — Patient Instructions (Signed)
   After Your ICD (Implantable Cardiac Defibrillator)    Monitor your defibrillator site for redness, swelling, and drainage. Call the device clinic at [phone] if you experience these symptoms or fever/chills.  Your incision was closed with Dermabond:  You may shower 1 day after your defibrillator implant and wash your incision with soap and water. Avoid lotions, ointments, or perfumes over your incision until it is well-healed.  You may use a hot tub or a pool after your wound check appointment if the incision is completely closed.  There are no other restrictions in arm movement after your wound check appointment.   We will have our pharmacist reach out to you for an appointment to process through all medications to assist with streamlining, following results from your lab work today.    Dr. Graciela Husbands will be your point person for all communication regarding dizziness, cardiac medication changes until we clarify origin.  Too many physicians involved in making changes may muddy the water.  You can reach Dr. Odessa Fleming device nursing team by either mychart or direct phone number above.    Your ICD is designed to protect you from life threatening heart rhythms. Because of this, you may receive a shock.   1 shock with no symptoms:  Call the office during business hours. 1 shock with symptoms (chest pain, chest pressure, dizziness, lightheadedness, shortness of breath, overall feeling unwell):  Call 911. If you experience 2 or more shocks in 24 hours:  Call 911. If you receive a shock, you should not drive.  [place] DMV - no driving for 6 months if you receive appropriate therapy from your ICD.   ICD Alerts:  Some alerts are vibratory and others beep. These are NOT emergencies. Please call our office to let us know. If this occurs at night or on weekends, it can wait until the next business day. Send a remote transmission.  If your device is capable of reading fluid status (for heart failure), you  will be offered monthly monitoring to review this with you.   Remote monitoring is used to monitor your ICD from home. This monitoring is scheduled every 91 days by our office. It allows [place] to keep an eye on the functioning of your device to ensure it is working properly. You will routinely see your Electrophysiologist annually (more often if necessary).

## 2023-06-13 LAB — BASIC METABOLIC PANEL WITH GFR
BUN/Creatinine Ratio: 16 (ref 10–24)
BUN: 22 mg/dL (ref 8–27)
CO2: 26 mmol/L (ref 20–29)
Calcium: 8.2 mg/dL — ABNORMAL LOW (ref 8.6–10.2)
Chloride: 100 mmol/L (ref 96–106)
Creatinine, Ser: 1.39 mg/dL — ABNORMAL HIGH (ref 0.76–1.27)
Glucose: 114 mg/dL — ABNORMAL HIGH (ref 70–99)
Potassium: 4.1 mmol/L (ref 3.5–5.2)
Sodium: 142 mmol/L (ref 134–144)
eGFR: 49 mL/min/1.73 — ABNORMAL LOW (ref >59)

## 2023-06-13 LAB — DIGOXIN LEVEL: Digoxin, Serum: <0.4 ng/mL — ABNORMAL LOW (ref 0.5–0.9)

## 2023-06-14 LAB — CUP PACEART INCLINIC DEVICE CHECK
Date Time Interrogation Session: 20241017103654
Implantable Lead Connection Status: 753985
Implantable Lead Connection Status: 753985
Implantable Lead Connection Status: 753985
Implantable Lead Implant Date: 20180907
Implantable Lead Implant Date: 20180907
Implantable Lead Implant Date: 20180907
Implantable Lead Location: 753858
Implantable Lead Location: 753859
Implantable Lead Location: 753860
Implantable Lead Model: 4396
Implantable Lead Model: 5076
Implantable Pulse Generator Implant Date: 20241004

## 2023-06-14 NOTE — Progress Notes (Signed)
Wound check appointment. Steri-strips removed. Wound without redness or edema. Wound healing within normal limits.  There is 1 small open area at proximal end of incision.  No stitch present, no signs of infection redness or drainage.  Wound care instructions given and to monitor if doesn't continue to heal, call our office.   Normal device function. Thresholds, sensing, impedance consistent with previous measurements.  Patient bi-ventricularly pacing 72% of the time. Patient in persistent AF with elevated intrinsic Vrates.  Reviewed with A. Tillery, PA-C.    Also, of note, patient experiencing significant dizziness since implant.  Device function normal.  There is noted polypharmacy and confusion with medications.  Dr. Graciela Husbands to be primary point for cardiac med control.  Patient is to stop farxiga, entresto and spiranolactone.  Daugther present and brought all meds in today.  Due to significant number of bottles and confusion, determined best to have pharmacist make appt to review all meds with patient/daughter to streamline moving forward.   Daughter instructed to only go throgh Dr. Graciela Husbands for medication changes and advice. Referral placed to pharmacist for outreach.    Device programmed for appropriate safey margin for chronic leads. Histogram distribution appropriate for patient and level of activity. AT/AF 100%.  Patient educated about wound care and shockk plan. There are no arm mobility or lifting restrictions..  ROV in 3 months with implanting physician.

## 2023-06-16 NOTE — Addendum Note (Signed)
Addended by: Kizzie Ide on: 06/16/2023 09:47 AM   Modules accepted: Orders

## 2023-06-30 ENCOUNTER — Other Ambulatory Visit: Payer: Self-pay

## 2023-06-30 ENCOUNTER — Ambulatory Visit: Payer: Medicare Other

## 2023-06-30 ENCOUNTER — Telehealth: Payer: Self-pay | Admitting: Cardiovascular Disease

## 2023-06-30 ENCOUNTER — Encounter: Payer: Self-pay | Admitting: Cardiovascular Disease

## 2023-06-30 ENCOUNTER — Emergency Department (HOSPITAL_COMMUNITY): Payer: Medicare Other

## 2023-06-30 ENCOUNTER — Encounter (HOSPITAL_COMMUNITY): Payer: Self-pay

## 2023-06-30 ENCOUNTER — Inpatient Hospital Stay (HOSPITAL_COMMUNITY)
Admission: EM | Admit: 2023-06-30 | Discharge: 2023-07-06 | DRG: 291 | Disposition: A | Discharge status: Home / Self Care | Payer: Medicare Other | Attending: Internal Medicine | Admitting: Internal Medicine

## 2023-06-30 DIAGNOSIS — I110 Hypertensive heart disease with heart failure: Secondary | ICD-10-CM | POA: Diagnosis not present

## 2023-06-30 DIAGNOSIS — Z888 Allergy status to other drugs, medicaments and biological substances status: Secondary | ICD-10-CM

## 2023-06-30 DIAGNOSIS — Z952 Presence of prosthetic heart valve: Secondary | ICD-10-CM | POA: Diagnosis not present

## 2023-06-30 DIAGNOSIS — I472 Ventricular tachycardia, unspecified: Secondary | ICD-10-CM | POA: Diagnosis not present

## 2023-06-30 DIAGNOSIS — J45909 Unspecified asthma, uncomplicated: Secondary | ICD-10-CM | POA: Diagnosis not present

## 2023-06-30 DIAGNOSIS — Z95810 Presence of automatic (implantable) cardiac defibrillator: Secondary | ICD-10-CM

## 2023-06-30 DIAGNOSIS — D649 Anemia, unspecified: Secondary | ICD-10-CM | POA: Diagnosis present

## 2023-06-30 DIAGNOSIS — Z87891 Personal history of nicotine dependence: Secondary | ICD-10-CM

## 2023-06-30 DIAGNOSIS — N1831 Chronic kidney disease, stage 3a: Secondary | ICD-10-CM | POA: Diagnosis not present

## 2023-06-30 DIAGNOSIS — I519 Heart disease, unspecified: Secondary | ICD-10-CM | POA: Diagnosis not present

## 2023-06-30 DIAGNOSIS — I517 Cardiomegaly: Secondary | ICD-10-CM | POA: Diagnosis not present

## 2023-06-30 DIAGNOSIS — Z7902 Long term (current) use of antithrombotics/antiplatelets: Secondary | ICD-10-CM

## 2023-06-30 DIAGNOSIS — I495 Sick sinus syndrome: Secondary | ICD-10-CM | POA: Diagnosis not present

## 2023-06-30 DIAGNOSIS — I252 Old myocardial infarction: Secondary | ICD-10-CM

## 2023-06-30 DIAGNOSIS — I4892 Unspecified atrial flutter: Secondary | ICD-10-CM | POA: Diagnosis present

## 2023-06-30 DIAGNOSIS — Z66 Do not resuscitate: Secondary | ICD-10-CM | POA: Diagnosis not present

## 2023-06-30 DIAGNOSIS — I509 Heart failure, unspecified: Secondary | ICD-10-CM

## 2023-06-30 DIAGNOSIS — Z8049 Family history of malignant neoplasm of other genital organs: Secondary | ICD-10-CM

## 2023-06-30 DIAGNOSIS — E785 Hyperlipidemia, unspecified: Secondary | ICD-10-CM | POA: Diagnosis present

## 2023-06-30 DIAGNOSIS — Z6841 Body Mass Index (BMI) 40.0 and over, adult: Secondary | ICD-10-CM

## 2023-06-30 DIAGNOSIS — I447 Left bundle-branch block, unspecified: Secondary | ICD-10-CM | POA: Diagnosis present

## 2023-06-30 DIAGNOSIS — E039 Hypothyroidism, unspecified: Secondary | ICD-10-CM | POA: Diagnosis not present

## 2023-06-30 DIAGNOSIS — I5043 Acute on chronic combined systolic (congestive) and diastolic (congestive) heart failure: Secondary | ICD-10-CM | POA: Diagnosis not present

## 2023-06-30 DIAGNOSIS — I129 Hypertensive chronic kidney disease with stage 1 through stage 4 chronic kidney disease, or unspecified chronic kidney disease: Secondary | ICD-10-CM | POA: Diagnosis not present

## 2023-06-30 DIAGNOSIS — Z860100 Personal history of colon polyps, unspecified: Secondary | ICD-10-CM

## 2023-06-30 DIAGNOSIS — I2510 Atherosclerotic heart disease of native coronary artery without angina pectoris: Secondary | ICD-10-CM

## 2023-06-30 DIAGNOSIS — Z953 Presence of xenogenic heart valve: Secondary | ICD-10-CM

## 2023-06-30 DIAGNOSIS — I5023 Acute on chronic systolic (congestive) heart failure: Secondary | ICD-10-CM

## 2023-06-30 DIAGNOSIS — Z825 Family history of asthma and other chronic lower respiratory diseases: Secondary | ICD-10-CM

## 2023-06-30 DIAGNOSIS — J90 Pleural effusion, not elsewhere classified: Secondary | ICD-10-CM | POA: Diagnosis not present

## 2023-06-30 DIAGNOSIS — Z79890 Hormone replacement therapy (postmenopausal): Secondary | ICD-10-CM

## 2023-06-30 DIAGNOSIS — K219 Gastro-esophageal reflux disease without esophagitis: Secondary | ICD-10-CM | POA: Diagnosis not present

## 2023-06-30 DIAGNOSIS — Z951 Presence of aortocoronary bypass graft: Secondary | ICD-10-CM | POA: Diagnosis not present

## 2023-06-30 DIAGNOSIS — I5082 Biventricular heart failure: Secondary | ICD-10-CM | POA: Diagnosis not present

## 2023-06-30 DIAGNOSIS — H353130 Nonexudative age-related macular degeneration, bilateral, stage unspecified: Secondary | ICD-10-CM | POA: Diagnosis present

## 2023-06-30 DIAGNOSIS — I130 Hypertensive heart and chronic kidney disease with heart failure and stage 1 through stage 4 chronic kidney disease, or unspecified chronic kidney disease: Secondary | ICD-10-CM | POA: Diagnosis not present

## 2023-06-30 DIAGNOSIS — Z950 Presence of cardiac pacemaker: Secondary | ICD-10-CM | POA: Diagnosis not present

## 2023-06-30 DIAGNOSIS — Z882 Allergy status to sulfonamides status: Secondary | ICD-10-CM

## 2023-06-30 DIAGNOSIS — I4729 Other ventricular tachycardia: Secondary | ICD-10-CM | POA: Diagnosis not present

## 2023-06-30 DIAGNOSIS — N179 Acute kidney failure, unspecified: Secondary | ICD-10-CM | POA: Diagnosis not present

## 2023-06-30 DIAGNOSIS — E669 Obesity, unspecified: Secondary | ICD-10-CM | POA: Diagnosis present

## 2023-06-30 DIAGNOSIS — M109 Gout, unspecified: Secondary | ICD-10-CM | POA: Diagnosis present

## 2023-06-30 DIAGNOSIS — Z8546 Personal history of malignant neoplasm of prostate: Secondary | ICD-10-CM

## 2023-06-30 DIAGNOSIS — Z7901 Long term (current) use of anticoagulants: Secondary | ICD-10-CM | POA: Diagnosis not present

## 2023-06-30 DIAGNOSIS — I4819 Other persistent atrial fibrillation: Secondary | ICD-10-CM | POA: Diagnosis present

## 2023-06-30 DIAGNOSIS — D72829 Elevated white blood cell count, unspecified: Secondary | ICD-10-CM | POA: Diagnosis present

## 2023-06-30 DIAGNOSIS — Z7982 Long term (current) use of aspirin: Secondary | ICD-10-CM

## 2023-06-30 DIAGNOSIS — J9811 Atelectasis: Secondary | ICD-10-CM | POA: Diagnosis not present

## 2023-06-30 DIAGNOSIS — R5383 Other fatigue: Secondary | ICD-10-CM | POA: Diagnosis present

## 2023-06-30 DIAGNOSIS — E66813 Obesity, class 3: Secondary | ICD-10-CM | POA: Diagnosis present

## 2023-06-30 DIAGNOSIS — Z96653 Presence of artificial knee joint, bilateral: Secondary | ICD-10-CM | POA: Diagnosis present

## 2023-06-30 DIAGNOSIS — Z803 Family history of malignant neoplasm of breast: Secondary | ICD-10-CM

## 2023-06-30 DIAGNOSIS — T502X5A Adverse effect of carbonic-anhydrase inhibitors, benzothiadiazides and other diuretics, initial encounter: Secondary | ICD-10-CM | POA: Diagnosis not present

## 2023-06-30 DIAGNOSIS — N183 Chronic kidney disease, stage 3 unspecified: Secondary | ICD-10-CM

## 2023-06-30 DIAGNOSIS — N400 Enlarged prostate without lower urinary tract symptoms: Secondary | ICD-10-CM | POA: Diagnosis present

## 2023-06-30 DIAGNOSIS — Z91010 Allergy to peanuts: Secondary | ICD-10-CM

## 2023-06-30 DIAGNOSIS — Z79899 Other long term (current) drug therapy: Secondary | ICD-10-CM

## 2023-06-30 DIAGNOSIS — I059 Rheumatic mitral valve disease, unspecified: Secondary | ICD-10-CM | POA: Diagnosis present

## 2023-06-30 DIAGNOSIS — R0602 Shortness of breath: Secondary | ICD-10-CM | POA: Diagnosis not present

## 2023-06-30 DIAGNOSIS — R0609 Other forms of dyspnea: Secondary | ICD-10-CM | POA: Diagnosis not present

## 2023-06-30 DIAGNOSIS — Z8249 Family history of ischemic heart disease and other diseases of the circulatory system: Secondary | ICD-10-CM

## 2023-06-30 LAB — IRON AND TIBC
Iron: 151 ug/dL (ref 45–182)
Saturation Ratios: 38 % (ref 17.9–39.5)
TIBC: 400 ug/dL (ref 250–450)
UIBC: 249 ug/dL

## 2023-06-30 LAB — CBC WITH DIFFERENTIAL/PLATELET
Abs Immature Granulocytes: 0.04 10*3/uL (ref 0.00–0.07)
Basophils Absolute: 0.1 10*3/uL (ref 0.0–0.1)
Basophils Relative: 1 %
Eosinophils Absolute: 0.2 10*3/uL (ref 0.0–0.5)
Eosinophils Relative: 2 %
HCT: 41.6 % (ref 39.0–52.0)
Hemoglobin: 12.5 g/dL — ABNORMAL LOW (ref 13.0–17.0)
Immature Granulocytes: 0 %
Lymphocytes Relative: 12 %
Lymphs Abs: 1.3 10*3/uL (ref 0.7–4.0)
MCH: 26.3 pg (ref 26.0–34.0)
MCHC: 30 g/dL (ref 30.0–36.0)
MCV: 87.6 fL (ref 80.0–100.0)
Monocytes Absolute: 1 10*3/uL (ref 0.1–1.0)
Monocytes Relative: 8 %
Neutro Abs: 8.9 10*3/uL — ABNORMAL HIGH (ref 1.7–7.7)
Neutrophils Relative %: 77 %
Platelets: 313 10*3/uL (ref 150–400)
RBC: 4.75 MIL/uL (ref 4.22–5.81)
RDW: 17.5 % — ABNORMAL HIGH (ref 11.5–15.5)
WBC: 11.5 10*3/uL — ABNORMAL HIGH (ref 4.0–10.5)
nRBC: 0 % (ref 0.0–0.2)

## 2023-06-30 LAB — COMPREHENSIVE METABOLIC PANEL
ALT: 26 U/L (ref 0–44)
AST: 30 U/L (ref 15–41)
Albumin: 2.7 g/dL — ABNORMAL LOW (ref 3.5–5.0)
Alkaline Phosphatase: 132 U/L — ABNORMAL HIGH (ref 38–126)
Anion gap: 9 (ref 5–15)
BUN: 14 mg/dL (ref 8–23)
CO2: 24 mmol/L (ref 22–32)
Calcium: 8.2 mg/dL — ABNORMAL LOW (ref 8.9–10.3)
Chloride: 107 mmol/L (ref 98–111)
Creatinine, Ser: 1.14 mg/dL (ref 0.61–1.24)
GFR, Estimated: >60 mL/min (ref >60)
Glucose, Bld: 133 mg/dL — ABNORMAL HIGH (ref 70–99)
Potassium: 4 mmol/L (ref 3.5–5.1)
Sodium: 140 mmol/L (ref 135–145)
Total Bilirubin: 1.1 mg/dL (ref <1.2)
Total Protein: 7 g/dL (ref 6.5–8.1)

## 2023-06-30 LAB — DIGOXIN LEVEL: Digoxin Level: <0.4 ng/mL — ABNORMAL LOW (ref 0.8–2.0)

## 2023-06-30 LAB — MAGNESIUM: Magnesium: 2.7 mg/dL — ABNORMAL HIGH (ref 1.7–2.4)

## 2023-06-30 LAB — TROPONIN I (HIGH SENSITIVITY)
Troponin I (High Sensitivity): 16 ng/L (ref <18)
Troponin I (High Sensitivity): 17 ng/L (ref <18)

## 2023-06-30 LAB — FERRITIN: Ferritin: 28 ng/mL (ref 24–336)

## 2023-06-30 LAB — BRAIN NATRIURETIC PEPTIDE: B Natriuretic Peptide: 392.9 pg/mL — ABNORMAL HIGH (ref 0.0–100.0)

## 2023-06-30 LAB — VITAMIN D 25 HYDROXY (VIT D DEFICIENCY, FRACTURES): Vit D, 25-Hydroxy: 28.24 ng/mL — ABNORMAL LOW (ref 30–100)

## 2023-06-30 LAB — TSH: TSH: 9.303 u[IU]/mL — ABNORMAL HIGH (ref 0.350–4.500)

## 2023-06-30 LAB — PHOSPHORUS: Phosphorus: 2.3 mg/dL — ABNORMAL LOW (ref 2.5–4.6)

## 2023-06-30 MED ORDER — METOCLOPRAMIDE HCL 10 MG PO TABS: 10.0000 mg | ORAL_TABLET | Freq: Every morning | ORAL | Status: DC

## 2023-06-30 MED ORDER — LEVOTHYROXINE SODIUM 100 MCG PO TABS
200.0000 ug | ORAL_TABLET | Freq: Every day | ORAL | Status: DC
Start: 2023-07-01 — End: 2023-07-06
  Administered 2023-07-01 – 2023-07-06 (×6): 200 ug via ORAL
  Filled 2023-06-30 (×6): qty 2

## 2023-06-30 MED ORDER — METOPROLOL SUCCINATE ER 50 MG PO TB24
100.0000 mg | ORAL_TABLET | Freq: Every day | ORAL | Status: DC
  Administered 2023-07-01 – 2023-07-06 (×6): 100 mg via ORAL
  Filled 2023-06-30 (×6): qty 2

## 2023-06-30 MED ORDER — AMIODARONE HCL 200 MG PO TABS
200.0000 mg | ORAL_TABLET | Freq: Every day | ORAL | Status: DC
  Administered 2023-07-01 – 2023-07-06 (×6): 200 mg via ORAL
  Filled 2023-06-30 (×6): qty 1

## 2023-06-30 MED ORDER — DABIGATRAN ETEXILATE MESYLATE 75 MG PO CAPS
150.0000 mg | ORAL_CAPSULE | Freq: Two times a day (BID) | ORAL | Status: DC
  Administered 2023-07-01 – 2023-07-06 (×12): 150 mg via ORAL
  Filled 2023-06-30: qty 2
  Filled 2023-06-30: qty 1
  Filled 2023-06-30 (×11): qty 2

## 2023-06-30 MED ORDER — SENNOSIDES-DOCUSATE SODIUM 8.6-50 MG PO TABS: 1.0000 | ORAL_TABLET | Freq: Every evening | ORAL | Status: DC | PRN

## 2023-06-30 MED ORDER — ALLOPURINOL 100 MG PO TABS
100.0000 mg | ORAL_TABLET | Freq: Every day | ORAL | Status: DC
  Administered 2023-06-30 – 2023-07-05 (×6): 100 mg via ORAL
  Filled 2023-06-30 (×6): qty 1

## 2023-06-30 MED ORDER — ISOSORBIDE MONONITRATE ER 30 MG PO TB24
30.0000 mg | ORAL_TABLET | Freq: Every day | ORAL | Status: DC
  Administered 2023-06-30 – 2023-07-05 (×6): 30 mg via ORAL
  Filled 2023-06-30 (×6): qty 1

## 2023-06-30 MED ORDER — ACETAMINOPHEN 650 MG RE SUPP: 650.0000 mg | Freq: Four times a day (QID) | RECTAL | Status: DC | PRN

## 2023-06-30 MED ORDER — FLUOROURACIL 5 % EX CREA: 1.0000 | TOPICAL_CREAM | Freq: Two times a day (BID) | CUTANEOUS | Status: DC | PRN

## 2023-06-30 MED ORDER — ALBUTEROL SULFATE (2.5 MG/3ML) 0.083% IN NEBU: 2.5000 mg | INHALATION_SOLUTION | RESPIRATORY_TRACT | Status: DC | PRN

## 2023-06-30 MED ORDER — POTASSIUM CHLORIDE CRYS ER 20 MEQ PO TBCR
20.0000 meq | EXTENDED_RELEASE_TABLET | Freq: Two times a day (BID) | ORAL | Status: DC
  Administered 2023-06-30 – 2023-07-05 (×10): 20 meq via ORAL
  Filled 2023-06-30 (×10): qty 1

## 2023-06-30 MED ORDER — ATORVASTATIN CALCIUM 20 MG PO TABS
20.0000 mg | ORAL_TABLET | Freq: Every day | ORAL | Status: DC
  Administered 2023-07-01 – 2023-07-06 (×6): 20 mg via ORAL
  Filled 2023-06-30 (×6): qty 1

## 2023-06-30 MED ORDER — ASPIRIN 81 MG PO CHEW
81.0000 mg | CHEWABLE_TABLET | Freq: Every day | ORAL | Status: DC
  Administered 2023-07-01 – 2023-07-06 (×6): 81 mg via ORAL
  Filled 2023-06-30 (×6): qty 1

## 2023-06-30 MED ORDER — LORATADINE 10 MG PO TABS
10.0000 mg | ORAL_TABLET | Freq: Every day | ORAL | Status: DC
  Administered 2023-07-01 – 2023-07-06 (×6): 10 mg via ORAL
  Filled 2023-06-30 (×6): qty 1

## 2023-06-30 MED ORDER — ADULT MULTIVITAMIN W/MINERALS CH
1.0000 | ORAL_TABLET | Freq: Every day | ORAL | Status: DC
  Administered 2023-07-01 – 2023-07-06 (×6): 1 via ORAL
  Filled 2023-06-30 (×6): qty 1

## 2023-06-30 MED ORDER — DIGOXIN 125 MCG PO TABS
125.0000 ug | ORAL_TABLET | ORAL | Status: DC
  Administered 2023-07-02 – 2023-07-04 (×2): 125 ug via ORAL
  Filled 2023-06-30 (×2): qty 1

## 2023-06-30 MED ORDER — FUROSEMIDE 10 MG/ML IJ SOLN
60.0000 mg | Freq: Once | INTRAMUSCULAR | Status: AC
  Administered 2023-06-30: 60 mg via INTRAVENOUS
  Filled 2023-06-30: qty 8

## 2023-06-30 MED ORDER — ONDANSETRON HCL 4 MG/2ML IJ SOLN: 4.0000 mg | Freq: Four times a day (QID) | INTRAMUSCULAR | Status: DC | PRN

## 2023-06-30 MED ORDER — PANTOPRAZOLE SODIUM 40 MG PO TBEC
40.0000 mg | DELAYED_RELEASE_TABLET | Freq: Every day | ORAL | Status: DC
  Administered 2023-07-01 – 2023-07-06 (×6): 40 mg via ORAL
  Filled 2023-06-30 (×6): qty 1

## 2023-06-30 MED ORDER — SERTRALINE HCL 50 MG PO TABS
50.0000 mg | ORAL_TABLET | Freq: Every day | ORAL | Status: DC
  Administered 2023-06-30 – 2023-07-05 (×6): 50 mg via ORAL
  Filled 2023-06-30 (×6): qty 1

## 2023-06-30 MED ORDER — BENZONATATE 100 MG PO CAPS: 200.0000 mg | ORAL_CAPSULE | Freq: Two times a day (BID) | ORAL | Status: DC | PRN

## 2023-06-30 MED ORDER — METHOCARBAMOL 500 MG PO TABS: 500.0000 mg | ORAL_TABLET | Freq: Four times a day (QID) | ORAL | Status: DC | PRN

## 2023-06-30 MED ORDER — ONDANSETRON HCL 4 MG PO TABS: 4.0000 mg | ORAL_TABLET | Freq: Four times a day (QID) | ORAL | Status: DC | PRN

## 2023-06-30 MED ORDER — DIPHENHYDRAMINE HCL 25 MG PO CAPS: 25.0000 mg | ORAL_CAPSULE | Freq: Every day | ORAL | Status: DC | PRN

## 2023-06-30 MED ORDER — KETOTIFEN FUMARATE 0.035 % OP SOLN: 1.0000 [drp] | Freq: Every day | OPHTHALMIC | Status: DC | PRN

## 2023-06-30 MED ORDER — ACETAMINOPHEN 325 MG PO TABS: 650.0000 mg | ORAL_TABLET | Freq: Four times a day (QID) | ORAL | Status: DC | PRN

## 2023-06-30 MED ORDER — MONTELUKAST SODIUM 10 MG PO TABS
10.0000 mg | ORAL_TABLET | Freq: Every day | ORAL | Status: DC
  Administered 2023-06-30 – 2023-07-05 (×6): 10 mg via ORAL
  Filled 2023-06-30 (×6): qty 1

## 2023-06-30 NOTE — ED Provider Notes (Signed)
[place] EMERGENCY DEPARTMENT AT [facility] Provider Note   CSN: [id number] Arrival date & time: 06/30/23  1250     History  Chief Complaint  Patient presents with   Shortness of Breath    Justin [name] is a 89 y.o. male.  HPI Patient presents with his daughters who provide much of the history. History is notable for multiple medical issues including pacemaker 1 month ago, heart failure, CAD.  He presents due to concern for dyspnea, fatigue, and currently his daughters hypersomnolence.  He has no pain, but notes that over the past few days he has been unable to perform previously unremarkable ADL secondary to dyspnea.  He notes ongoing substantial edema diffusely.  He has been taking his medication regularly. No fever, no cough, no asymmetry of the swelling.    Home Medications Prior to Admission medications   Medication Sig Start Date End Date Taking? Authorizing Provider  albuterol (VENTOLIN HFA) 108 (90 Base) MCG/ACT inhaler Inhale 2 puffs into the lungs every 4 (four) hours as needed for wheezing or shortness of breath. 05/06/23   Nelwyn Salisbury, MD  allopurinol (ZYLOPRIM) 100 MG tablet Take 1 tablet (100 mg total) by mouth daily. 10/18/22   Philip Aspen, Limmie Patricia, MD  amiodarone (PACERONE) 200 MG tablet Take 2 tablets by mouth twice daily x 2 weeks then decrease to 2 tablets by mouth daily x 2 weeks Patient taking differently: Take 200 mg by mouth daily. 11/13/22   Duke Salvia, MD  aspirin 81 MG tablet Take 1 tablet (81 mg total) by mouth daily. 05/19/19   Magnant, Charles L, PA-C  atorvastatin (LIPITOR) 20 MG tablet TAKE ONE TABLET ONCE DAILY 10/18/22   Philip Aspen, Limmie Patricia, MD  azithromycin [facility] Z-PAK) 250 MG tablet As directed 05/06/23   Nelwyn Salisbury, MD  benzonatate (TESSALON) 200 MG capsule Take 1 capsule (200 mg total) by mouth 2 (two) times daily as needed for cough. 05/06/23   Nelwyn Salisbury, MD  cetirizine (ZYRTEC) 10 MG tablet Take 10  mg by mouth 2 (two) times daily. [facility]    [provider]  colchicine 0.6 MG tablet Take 1 tablet (0.6 mg total) by mouth every 6 (six) hours as needed (gout). 10/30/21 11/25/23  Nelwyn Salisbury, MD  dabigatran (PRADAXA) 150 MG CAPS capsule TAKE 1 CAPSULE BY MOUTH TWICE  DAILY 12/18/22   Nelwyn Salisbury, MD  digoxin (LANOXIN) 0.125 MG tablet Take 1 tablet (125 mcg total) by mouth every other day. 05/30/23 05/29/24  Duke Salvia, MD  diphenhydrAMINE (BENADRYL) 25 MG tablet Take 25 mg by mouth daily as needed for allergies.    [provider]  fluorouracil (EFUDEX) 5 % cream Apply 1 application  topically 2 (two) times daily as needed (rash).    [provider]  isosorbide mononitrate (IMDUR) 30 MG 24 hr tablet Take 1 tablet (30 mg total) by mouth at bedtime. 06/10/23   Duke Salvia, MD  ketotifen (ZADITOR) 0.025 % ophthalmic solution Apply 1 drop to eye daily as needed (itchy eyes).     [provider]  levothyroxine (SYNTHROID) 200 MCG tablet Take 1 tablet (200 mcg total) by mouth daily. 12/25/22   Nelwyn Salisbury, MD  methocarbamol (ROBAXIN) 500 MG tablet Take 1 tablet (500 mg total) by mouth 4 (four) times daily. Patient taking differently: Take 500 mg by mouth every 6 (six) hours as needed for muscle spasms. 09/13/22   Christell Constant,  Tyrone Apple, MD  metoCLOPramide (REGLAN) 10 MG tablet TAKE ONE TABLET BY MOUTH EVERY MORNING WITH BREAKFAST 04/14/23   Nelwyn Salisbury, MD  metolazone (ZAROXOLYN) 2.5 MG tablet Take 1 tablet (2.5 mg total) by mouth once a week. Take 1 tablet every Saturday 02/18/23   Graciella Freer, PA-C  metoprolol succinate (TOPROL-XL) 50 MG 24 hr tablet Take 2 tablets (100 mg total) by mouth daily. Take with or immediately following a meal. 05/30/23   Duke Salvia, MD  montelukast (SINGULAIR) 10 MG tablet TAKE ONE TABLET BY MOUTH ONCE DAILY IN THE EVENING 08/20/22   Hunsucker, Lesia Sago, MD  Multiple Minerals-Vitamins (CAL MAG ZINC +D3 PO) Take 2  tablets by mouth daily. 500 mg / 7 mg / 25 mcg    [provider]  Multiple Vitamin (MULTIVITAMIN) capsule Take 2 capsules by mouth daily. gummy    [provider]  Multiple Vitamins-Minerals (ICAPS AREDS FORMULA PO) Take 1 tablet by mouth 2 (two) times daily.    [provider]  omeprazole (PRILOSEC) 20 MG capsule TAKE ONE CAPSULE TWICE DAILY 03/17/23   Nelwyn Salisbury, MD  potassium chloride SA (KLOR-CON M) 20 MEQ tablet Take 1 tablet (20 mEq total) by mouth 2 (two) times daily. 02/18/23   Graciella Freer, PA-C  sertraline (ZOLOFT) 50 MG tablet Take 1 tablet (50 mg total) by mouth daily. 04/02/23   Nelwyn Salisbury, MD  silver sulfADIAZINE (SILVADENE) 1 % cream Apply 1 Application topically 2 (two) times daily. 05/14/23   Nelwyn Salisbury, MD  torsemide (DEMADEX) 20 MG tablet Take 2 tablets (40 mg total) every other day, alternating with 1 tablet (20 mg total) every other day. Patient taking differently: Take 20-40 mg by mouth See admin instructions. Take 2 tablets (40 mg total) every other day, alternating with 1 tablet (20 mg total) every other day. 02/18/23   Graciella Freer, PA-C  triamcinolone cream (KENALOG) 0.1 % Apply 1 application  topically daily as needed for dry skin. 04/15/17   [provider]  valACYclovir (VALTREX) 500 MG tablet Take 500 mg by mouth 2 (two) times daily as needed (cold sores).    [provider]      Allergies    Peanut-containing drug products, Sulfonamide derivatives, Amlodipine, Eliquis [apixaban], Lisinopril, and Xarelto [rivaroxaban]    Review of Systems   Review of Systems  Physical Exam Updated Vital Signs BP 128/78   Pulse 81   Temp 97.8 F (36.6 C) (Oral)   Resp 18   SpO2 98%  Physical Exam Vitals and nursing note reviewed.  Constitutional:      General: He is not in acute distress.    Appearance: He is well-developed.  HENT:     Head: Normocephalic and atraumatic.  Eyes:      Conjunctiva/sclera: Conjunctivae normal.  Cardiovascular:     Rate and Rhythm: Normal rate and regular rhythm.  Pulmonary:     Effort: Pulmonary effort is normal. No respiratory distress.     Breath sounds: No stridor. Decreased breath sounds present.  Abdominal:     General: There is no distension.  Musculoskeletal:     Right lower leg: Edema present.     Left lower leg: Edema present.  Skin:    General: Skin is warm and dry.  Neurological:     Mental Status: He is alert and oriented to person, place, and time.     ED Results / Procedures / Treatments   Labs (  all labs ordered are listed, but only abnormal results are displayed) Labs Reviewed  CBC WITH DIFFERENTIAL/PLATELET - Abnormal; Notable for the following components:      Result Value   WBC 11.5 (*)    Hemoglobin 12.5 (*)    RDW 17.5 (*)    Neutro Abs 8.9 (*)    All other components within normal limits  COMPREHENSIVE METABOLIC PANEL - Abnormal; Notable for the following components:   Glucose, Bld 133 (*)    Calcium 8.2 (*)    Albumin 2.7 (*)    Alkaline Phosphatase 132 (*)    All other components within normal limits  BRAIN NATRIURETIC PEPTIDE - Abnormal; Notable for the following components:   B Natriuretic Peptide 392.9 (*)    All other components within normal limits  DIGOXIN LEVEL  TROPONIN I (HIGH SENSITIVITY)  TROPONIN I (HIGH SENSITIVITY)    EKG EKG Interpretation Date/Time:  Monday June 30 2023 13:00:52 EST Ventricular Rate:  92 PR Interval:    QRS Duration:  140 QT Interval:  462 QTC Calculation: 547 R Axis:   256  Text Interpretation: VENTRICULAR PACED RHYTHM Confirmed by Gerhard Munch [phone]) on 06/30/2023 1:41:38 PM  Radiology No results found.  Procedures Procedures    Medications Ordered in ED Medications  furosemide (LASIX) injection 60 mg (has no administration in time range)    ED Course/ Medical Decision Making/ A&P                                 Medical Decision  Making Elderly male with multiple medical issues including pacer, heart failure, presents with anasarca, increased work of breathing, concern for heart failure exacerbation versus pneumonia, less likely bacteremia or sepsis given the absence of fever, hypotension.  Patient has no current chest pain, ACS a consideration, though less likely. I discussed the patient's case with cardiology and they will follow as a consulting team.  Patient's labs notable for mild leukocytosis, right elevated BNP consistent with suspicion for heart failure.  Patient received IV Lasix, required mission to internal medicine.  Amount and/or Complexity of Data Reviewed Independent Historian:     Details: 2 daughters External Data Reviewed: notes. Labs: ordered. Decision-making details documented in ED Course. Radiology: ordered and independent interpretation performed. Decision-making details documented in ED Course. ECG/medicine tests: ordered and independent interpretation performed. Decision-making details documented in ED Course.    Details: Cardiomegaly  Risk Prescription drug management. Decision regarding hospitalization.   Echocardiogram November 12, 2022: 1. Windows are challenging. Left ventricular ejection fraction, by  estimation, is 40 to 45%. The left ventricle has mildly decreased  function. The left ventricle demonstrates global hypokinesis. There is  moderate concentric left ventricular hypertrophy.   Left ventricular diastolic parameters are indeterminate.   2. Right ventricular systolic function is mildly reduced. The right  ventricular size is mildly enlarged. There is normal pulmonary artery  systolic pressure.   3. No evidence of mitral valve regurgitation.   4. S/p TAVR 29 mm Edwards S3. No significant PVL. AV max 1.5 m/s. Normal  prosthesis. Aortic valve regurgitation is not visualized. Aortic valve  area, by VTI measures 2.88 cm.   5. Aneurysm of the ascending aorta, measuring 41 mm.    6. The inferior vena cava is normal in size with greater than 50%  respiratory variability, suggesting right atrial pressure of 3 mmHg.   Final Clinical Impression(s) / ED Diagnoses Final diagnoses:  Acute  on chronic combined systolic and diastolic heart failure (HCC)     Gerhard Munch, MD 06/30/23 1536

## 2023-06-30 NOTE — Progress Notes (Deleted)
Office Visit    Patient Name: Justin [name] Date of Encounter: 06/30/2023  Primary Care Provider:  Nelwyn Salisbury, MD Primary Cardiologist:  Verne Carrow, MD  Chief Complaint    Hypertension  Significant Past Medical History   AF On amiodarone, metoprolol Klekl  CHF      hypothyroid   gout     Allergies  Allergen Reactions   Peanut-Containing Drug Products Anaphylaxis   Sulfonamide Derivatives Anaphylaxis   Amlodipine Swelling    Swelling in ankles   Eliquis [Apixaban] Other (See Comments)    Back/hip pain   Lisinopril Cough    cough   Xarelto [Rivaroxaban] Other (See Comments)    Back/hip pain    History of Present Illness    Justin [name] is a 89 y.o. male patient of Dr Clifton James,  Asthma Gout Hld   Blood Pressure Goal:  130/80  Current Medications:    Previously tried:    Family Hx:     Social Hx:      Tobacco:  Alcohol:  Caffeine: Diet:      Exercise:   Home BP readings:      Adherence Assessment  Do you ever forget to take your medication? [] Yes [] No  Do you ever skip doses due to side effects? [] Yes [] No  Do you have trouble affording your medicines? [] Yes [] No  Are you ever unable to pick up your medication due to transportation difficulties? [] Yes [] No  Do you ever stop taking your medications because you don't believe they are helping? [] Yes [] No  Do you check your weight daily? [] Yes [] No   Adherence strategy: ***  Barriers to obtaining medications: ***     Accessory Clinical Findings    Lab Results  Component Value Date   CREATININE 1.39 (H) 06/12/2023   BUN 22 06/12/2023   NA 142 06/12/2023   K 4.1 06/12/2023   CL 100 06/12/2023   CO2 26 06/12/2023   Lab Results  Component Value Date   ALT 23 04/18/2023   AST 27 04/18/2023   ALKPHOS 122 (H) 04/18/2023   BILITOT 0.8 04/18/2023   Lab Results  Component Value Date   HGBA1C 6.4 10/10/2022    Home Medications    Current Outpatient  Medications  Medication Sig Dispense Refill   albuterol (VENTOLIN HFA) 108 (90 Base) MCG/ACT inhaler Inhale 2 puffs into the lungs every 4 (four) hours as needed for wheezing or shortness of breath. 8 g 5   allopurinol (ZYLOPRIM) 100 MG tablet Take 1 tablet (100 mg total) by mouth daily. 90 tablet 3   amiodarone (PACERONE) 200 MG tablet Take 2 tablets by mouth twice daily x 2 weeks then decrease to 2 tablets by mouth daily x 2 weeks (Patient taking differently: Take 200 mg by mouth daily.) 84 tablet 0   aspirin 81 MG tablet Take 1 tablet (81 mg total) by mouth daily. 30 tablet 0   atorvastatin (LIPITOR) 20 MG tablet TAKE ONE TABLET ONCE DAILY 90 tablet 3   azithromycin (ZITHROMAX Z-PAK) 250 MG tablet As directed 6 each 0   benzonatate (TESSALON) 200 MG capsule Take 1 capsule (200 mg total) by mouth 2 (two) times daily as needed for cough. 60 capsule 2   cetirizine (ZYRTEC) 10 MG tablet Take 10 mg by mouth 2 (two) times daily. [facility]     colchicine 0.6 MG tablet Take 1 tablet (0.6 mg total) by mouth every 6 (six) hours as needed (gout). 60 tablet  5   dabigatran (PRADAXA) 150 MG CAPS capsule TAKE 1 CAPSULE BY MOUTH TWICE  DAILY 180 capsule 3   digoxin (LANOXIN) 0.125 MG tablet Take 1 tablet (125 mcg total) by mouth every other day. 15 tablet 3   diphenhydrAMINE (BENADRYL) 25 MG tablet Take 25 mg by mouth daily as needed for allergies.     fluorouracil (EFUDEX) 5 % cream Apply 1 application  topically 2 (two) times daily as needed (rash).     isosorbide mononitrate (IMDUR) 30 MG 24 hr tablet Take 1 tablet (30 mg total) by mouth at bedtime.     ketotifen (ZADITOR) 0.025 % ophthalmic solution Apply 1 drop to eye daily as needed (itchy eyes).      levothyroxine (SYNTHROID) 200 MCG tablet Take 1 tablet (200 mcg total) by mouth daily. 90 tablet 3   methocarbamol (ROBAXIN) 500 MG tablet Take 1 tablet (500 mg total) by mouth 4 (four) times daily. (Patient taking differently: Take 500 mg by mouth  every 6 (six) hours as needed for muscle spasms.) 40 tablet 0   metoCLOPramide (REGLAN) 10 MG tablet TAKE ONE TABLET BY MOUTH EVERY MORNING WITH BREAKFAST 30 tablet 3   metolazone (ZAROXOLYN) 2.5 MG tablet Take 1 tablet (2.5 mg total) by mouth once a week. Take 1 tablet every Saturday 4 tablet 3   metoprolol succinate (TOPROL-XL) 50 MG 24 hr tablet Take 2 tablets (100 mg total) by mouth daily. Take with or immediately following a meal. 180 tablet 2   montelukast (SINGULAIR) 10 MG tablet TAKE ONE TABLET BY MOUTH ONCE DAILY IN THE EVENING 90 tablet 3   Multiple Minerals-Vitamins (CAL MAG ZINC +D3 PO) Take 2 tablets by mouth daily. 500 mg / 7 mg / 25 mcg     Multiple Vitamin (MULTIVITAMIN) capsule Take 2 capsules by mouth daily. gummy     Multiple Vitamins-Minerals (ICAPS AREDS FORMULA PO) Take 1 tablet by mouth 2 (two) times daily.     omeprazole (PRILOSEC) 20 MG capsule TAKE ONE CAPSULE TWICE DAILY 180 capsule 0   potassium chloride SA (KLOR-CON M) 20 MEQ tablet Take 1 tablet (20 mEq total) by mouth 2 (two) times daily. 180 tablet 3   sertraline (ZOLOFT) 50 MG tablet Take 1 tablet (50 mg total) by mouth daily. 30 tablet 2   silver sulfADIAZINE (SILVADENE) 1 % cream Apply 1 Application topically 2 (two) times daily. 50 g 5   torsemide (DEMADEX) 20 MG tablet Take 2 tablets (40 mg total) every other day, alternating with 1 tablet (20 mg total) every other day. (Patient taking differently: Take 20-40 mg by mouth See admin instructions. Take 2 tablets (40 mg total) every other day, alternating with 1 tablet (20 mg total) every other day.) 180 tablet 3   triamcinolone cream (KENALOG) 0.1 % Apply 1 application  topically daily as needed for dry skin.     valACYclovir (VALTREX) 500 MG tablet Take 500 mg by mouth 2 (two) times daily as needed (cold sores).     No current facility-administered medications for this visit.     No BP recorded.  {Refresh Note OR Click here to enter BP  :1}***   Assessment &  Plan    No problem-specific Assessment & Plan notes found for this encounter.   Phillips Hay PharmD CPP [facility]  [address] [place], [place] [address] [phone]

## 2023-06-30 NOTE — Progress Notes (Deleted)
Patient ID: DEKLIN BIELER                 DOB: 23-Oct-1933                      MRN: [id number]     HPI: Justin [name] is a 89 y.o. male referred by Marlaine Hind *** to pharmacy clinic for HF medication management. PMH is significant for ***. Most recent LVEF *** on ***.  Discussion with patient today included the following: cardiac medication indications, introduction to GDMT clinic, reasoning behind medication titration, importance of medication adherence, and patient engagement. . At last visit with MD ***. Symptomatically, she is feeling ***, *** dizziness, lightheadedness, and fatigue. *** chest pain or palpitations. Feels SOB when ***. Able to complete all ADLs. Activity level ***. She *** checks her weight at home (normal range *** - *** lbs). *** LEE, PND, or orthopnea. Appetite has been ***. She *** adheres to a low-salt diet.      Current CHF meds:  Previously tried:  Adherence Assessment  Do you ever forget to take your medication? [] Yes [] No  Do you ever skip doses due to side effects? [] Yes [] No  Do you have trouble affording your medicines? [] Yes [] No  Are you ever unable to pick up your medication due to transportation difficulties? [] Yes [] No  Do you ever stop taking your medications because you don't believe they are helping? [] Yes [] No  Do you check your weight daily? [] Yes [] No   Adherence strategy: ***  Barriers to obtaining medications: ***  BP goal:   Family History:   Social History:   Diet:   Exercise:   Home BP readings:   Wt Readings from Last 3 Encounters:  05/30/23 259 lb (117.5 kg)  04/18/23 257 lb (116.6 kg)  02/17/23 262 lb 6.4 oz (119 kg)   BP Readings from Last 3 Encounters:  05/30/23 (!) 108/57  04/18/23 108/68  02/17/23 102/64   Pulse Readings from Last 3 Encounters:  05/30/23 68  04/18/23 (!) 105  02/17/23 90    Renal function: CrCl cannot be calculated (Unknown ideal weight.).  Past Medical History:   Diagnosis Date   Age-related macular degeneration, dry, both eyes    Allergic    "24/7; 365 days/year; I'm allergic to pollens, dust, all southern grasses/trees, mold, mildue, cats, dogs" (01/07/2017)   Anal fissure    Asthma    sees Dr. Kendrick Fries    Benign prostatic hypertrophy    (sees Dr. Chester Holstein   CAD (coronary artery disease)    a. s/p CABG 2007. b. Cath 08/2016 - 4/4 patent grafts.   Carotid bruit    carotid u/s 10/10: 0.39% bilaterally   Chronic combined systolic and diastolic CHF (congestive heart failure) (HCC)    Complication of anesthesia 1980s   "w/anal cyst OR, he gave me a saddle block then put a narcotic in spinal cord; had a severe reaction to that" (01/07/2017)   Congestive heart failure (CHF) [facility])    ED (erectile dysfunction)    Family history of adverse reaction to anesthesia    "daughter wakes up during OR" (01/07/2017)   GERD (gastroesophageal reflux disease)    Gout    HTN (hypertension)    Hx of colonic polyps    (sees Dr. Marina Goodell)   Hyperlipidemia    Hypothyroidism    Moderate to severe aortic stenosis    a. s/p TAVR 02/2017.   Myocardial infarction (HCC) ~ 2000  Obesity    Osteoarthritis    "was in my knees, hands" (01/07/2017 )   PAF (paroxysmal atrial fibrillation) (HCC)    a. documented post TAVR.   Precancerous skin lesion    (sees Dr. 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 8 g 5   allopurinol (ZYLOPRIM) 100 MG tablet Take 1 tablet (100 mg total) by mouth daily. 90 tablet 3   amiodarone (PACERONE) 200 MG tablet Take  2 tablets by mouth twice daily x 2 weeks then decrease to 2 tablets by mouth daily x 2 weeks (Patient taking differently: Take 200 mg by mouth daily.) 84 tablet 0   aspirin 81 MG tablet Take 1 tablet (81 mg total) by mouth daily. 30 tablet 0   atorvastatin (LIPITOR) 20 MG tablet TAKE ONE TABLET ONCE DAILY 90 tablet 3   azithromycin (ZITHROMAX Z-PAK) 250 MG tablet As directed 6 each 0   benzonatate (TESSALON) 200 MG capsule Take 1 capsule (200 mg total) by mouth 2 (two) times daily as needed for cough. 60 capsule 2   cetirizine (ZYRTEC) 10 MG tablet Take 10 mg by mouth 2 (two) times daily. [facility]     colchicine 0.6 MG tablet Take 1 tablet (0.6 mg total) by mouth every 6 (six) hours as needed (gout). 60 tablet 5   dabigatran (PRADAXA) 150 MG CAPS capsule TAKE 1 CAPSULE BY MOUTH TWICE  DAILY 180 capsule 3   digoxin (LANOXIN) 0.125 MG tablet Take 1 tablet (125 mcg total) by mouth every other day. 15 tablet 3   diphenhydrAMINE (BENADRYL) 25 MG tablet Take 25 mg by mouth daily as needed for allergies.     fluorouracil (EFUDEX) 5 % cream Apply 1 application  topically 2 (two) times daily as needed (rash).     isosorbide mononitrate (IMDUR) 30 MG 24 hr tablet Take 1 tablet (30 mg total) by mouth at bedtime.     ketotifen (ZADITOR) 0.025 % ophthalmic solution Apply 1 drop to eye daily as needed (itchy eyes).      levothyroxine (SYNTHROID) 200 MCG tablet Take 1 tablet (200 mcg total) by mouth daily. 90 tablet 3   methocarbamol (ROBAXIN) 500 MG tablet Take 1 tablet (500 mg total) by mouth 4 (four) times daily. (Patient taking differently: Take 500 mg by mouth every 6 (six) hours as needed for muscle spasms.) 40 tablet 0   metoCLOPramide (REGLAN) 10 MG tablet TAKE ONE TABLET BY MOUTH EVERY MORNING WITH BREAKFAST 30 tablet 3   metolazone (ZAROXOLYN) 2.5 MG tablet Take 1 tablet (2.5 mg total) by mouth once a week. Take 1 tablet every Saturday 4 tablet 3   metoprolol succinate (TOPROL-XL) 50 MG 24 hr  tablet Take 2 tablets (100 mg total) by mouth daily. Take with or immediately following a meal. 180 tablet 2   montelukast (SINGULAIR) 10 MG tablet TAKE ONE TABLET BY MOUTH ONCE DAILY IN THE EVENING 90 tablet 3   Multiple Minerals-Vitamins (CAL MAG ZINC +D3 PO) Take 2 tablets by mouth daily. 500 mg / 7 mg /  25 mcg     Multiple Vitamin (MULTIVITAMIN) capsule Take 2 capsules by mouth daily. gummy     Multiple Vitamins-Minerals (ICAPS AREDS FORMULA PO) Take 1 tablet by mouth 2 (two) times daily.     omeprazole (PRILOSEC) 20 MG capsule TAKE ONE CAPSULE TWICE DAILY 180 capsule 0   potassium chloride SA (KLOR-CON M) 20 MEQ tablet Take 1 tablet (20 mEq total) by mouth 2 (two) times daily. 180 tablet 3   sertraline (ZOLOFT) 50 MG tablet Take 1 tablet (50 mg total) by mouth daily. 30 tablet 2   silver sulfADIAZINE (SILVADENE) 1 % cream Apply 1 Application topically 2 (two) times daily. 50 g 5   torsemide (DEMADEX) 20 MG tablet Take 2 tablets (40 mg total) every other day, alternating with 1 tablet (20 mg total) every other day. (Patient taking differently: Take 20-40 mg by mouth See admin instructions. Take 2 tablets (40 mg total) every other day, alternating with 1 tablet (20 mg total) every other day.) 180 tablet 3   triamcinolone cream (KENALOG) 0.1 % Apply 1 application  topically daily as needed for dry skin.     valACYclovir (VALTREX) 500 MG tablet Take 500 mg by mouth 2 (two) times daily as needed (cold sores).     No current facility-administered medications on file prior to visit.    Allergies  Allergen Reactions   Peanut-Containing Drug Products Anaphylaxis   Sulfonamide Derivatives Anaphylaxis   Amlodipine Swelling    Swelling in ankles   Eliquis [Apixaban] Other (See Comments)    Back/hip pain   Lisinopril Cough    cough   Xarelto [Rivaroxaban] Other (See Comments)    Back/hip pain     Assessment/Plan:  1. CHF -  No problem-specific Assessment & Plan notes found for this  encounter.  There are no diagnoses linked to this encounter.     Thank you   Carmela Hurt, Pharm.D [facility] A Division of [facility] [address], [place], [place] [address]  Phone: [phone]; Fax: [phone]

## 2023-06-30 NOTE — H&P (Signed)
History and Physical    Patient: Justin [name] BJS:[id number] DOB: May 24, 1934 DOA: 06/30/2023 DOS: the patient was seen and examined on 06/30/2023 PCP: Nelwyn Salisbury, MD  Patient coming from: Home  Chief Complaint:  Chief Complaint  Patient presents with   Shortness of Breath   HPI: Justin [name] is a 89 y.o. male with medical history significant of h h/o VT, SND s/p PPM, Persistent AF/AFL, aortic stenosis s/p TAVR, CAD s/p CABG, obesity, PVCs, age-related macular degeneration, BPH, hypothyroidism, HTN, HLD, chronic systolic CHF, GERD, gout and asthma who presented to the ED for evaluation of shortness of breath. Per daughter, since patient's pacemaker was replaced on October 4th, he has become more fatigue and dyspnea on exertion. Since Friday, he has had progressive dyspnea on exertion to the point where he gets out of breath just walking to the bathroom.  He has been more fatigued and often sleeping all day.  He also endorsed some wheezing and leg swelling but denies any chest pain, headaches, dizziness or abdominal pain, dysuria or nausea vomiting.  ED course: Hemodynamically stable.  Labs show WBC 11.5, Hgb 12.5, K+ 4.0, creatinine 1.14, BNP 392, troponin 16. CXR showed minimal bibasilar subsegmental atelectasis with minimal pleural effusions. Patient received IV Lasix 60 mg x 1. Cardiology was consulted for evaluation and hospitalist was consulted for admission.  Review of Systems: As mentioned in the history of present illness. All other systems reviewed and are negative. Past Medical History:  Diagnosis Date   Age-related macular degeneration, dry, both eyes    Allergic    "24/7; 365 days/year; I'm allergic to pollens, dust, all southern grasses/trees, mold, mildue, cats, dogs" (01/07/2017)   Anal fissure    Asthma    sees Dr. Kendrick Fries    Benign prostatic hypertrophy    (sees Dr. Chester Holstein   CAD (coronary artery disease)    a. s/p CABG 2007. b. Cath 08/2016 - 4/4 patent  grafts.   Carotid bruit    carotid u/s 10/10: 0.39% bilaterally   Chronic combined systolic and diastolic CHF (congestive heart failure) (HCC)    Complication of anesthesia 1980s   "w/anal cyst OR, he gave me a saddle block then put a narcotic in spinal cord; had a severe reaction to that" (01/07/2017)   Congestive heart failure (CHF) [facility])    ED (erectile dysfunction)    Family history of adverse reaction to anesthesia    "daughter wakes up during OR" (01/07/2017)   GERD (gastroesophageal reflux disease)    Gout    HTN (hypertension)    Hx of colonic polyps    (sees Dr. Marina Goodell)   Hyperlipidemia    Hypothyroidism    Moderate to severe aortic stenosis    a. s/p TAVR 02/2017.   Myocardial infarction [facility]) ~ 2000   Obesity    Osteoarthritis    "was in my knees, hands" (01/07/2017 )   PAF (paroxysmal atrial fibrillation) (HCC)    a. documented post TAVR.   Precancerous skin lesion    (sees Dr. 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   29 mm Edwards Sapien 3 transcatheter heart valve placed via percutaneous right transfemoral approach   Past Surgical History:  Procedure Laterality Date   ANUS SURGERY     "opened it back up cause it wouldn't heal; wound up w/a fissure" (01/07/2017)   BIV ICD INSERTION CRT-D N/A 05/02/2017  Procedure: BIV ICD INSERTION CRT-D;  Surgeon: Duke Salvia, MD;  Location: [facility] INVASIVE CV LAB;  Service: Cardiovascular;  Laterality: N/A;   CARDIAC CATHETERIZATION  10/29/2005   CARDIAC CATHETERIZATION N/A 08/27/2016   Procedure: Right/Left Heart Cath and Coronary/Graft Angiography;  Surgeon: Kathleene Hazel, MD;  Location: [facility] INVASIVE CV LAB;  Service: Cardiovascular;  Laterality: N/A;   CARDIOVERSION N/A 04/24/2021   Procedure: CARDIOVERSION;  Surgeon: Pricilla Riffle, MD;  Location: [facility] ENDOSCOPY;  Service: Cardiovascular;  Laterality: N/A;   CARDIOVERSION N/A 11/08/2021    Procedure: CARDIOVERSION;  Surgeon: Chrystie Nose, MD;  Location: [facility] ENDOSCOPY;  Service: Cardiovascular;  Laterality: N/A;   CARDIOVERSION N/A 02/06/2022   Procedure: CARDIOVERSION;  Surgeon: Wendall Stade, MD;  Location: [facility] ENDOSCOPY;  Service: Cardiovascular;  Laterality: N/A;   CATARACT EXTRACTION W/ INTRAOCULAR LENS  IMPLANT, BILATERAL Bilateral    CATARACT EXTRACTION, BILATERAL  2012   COLONOSCOPY  06/30/2008   no repeats needed    COLONOSCOPY     had 3 or 4 in the past    CORONARY ARTERY BYPASS GRAFT  2007   "CABG X4"   CYST EXCISION PERINEAL  1980s   HAMMER TOE SURGERY Bilateral    ICD GENERATOR CHANGEOUT N/A 05/30/2023   Procedure: ICD GENERATOR CHANGEOUT;  Surgeon: Duke Salvia, MD;  Location: [facility] INVASIVE CV LAB;  Service: Cardiovascular;  Laterality: N/A;   JOINT REPLACEMENT     KNEE ARTHROPLASTY  07/30/2011   Procedure: COMPUTER ASSISTED TOTAL KNEE ARTHROPLASTY;  Surgeon: Cammy Copa;  Location: [facility] OR;  Service: Orthopedics;  Laterality: Left;  left total knee arthroplasty   MASTECTOMY SUBCUTANEOUS Bilateral    MULTIPLE TOOTH EXTRACTIONS     ORIF FINGER / THUMB FRACTURE Right ~ 1980   "repair of thumb injury"   PROSTATE BIOPSY     REPLACEMENT TOTAL KNEE BILATERAL Bilateral 2012   TEE WITHOUT CARDIOVERSION N/A 03/04/2017   Procedure: TRANSESOPHAGEAL ECHOCARDIOGRAM (TEE);  Surgeon: Kathleene Hazel, MD;  Location: [facility] OR;  Service: Open Heart Surgery;  Laterality: N/A;   TOTAL KNEE REVISION Right 05/18/2019   Procedure: RIGHT PATELLA REVISION/REMOVAL;  Surgeon: Cammy Copa, MD;  Location: [facility] OR;  Service: Orthopedics;  Laterality: Right;   TRANSCATHETER AORTIC VALVE REPLACEMENT, TRANSFEMORAL N/A 03/04/2017   Procedure: TRANSCATHETER AORTIC VALVE REPLACEMENT, TRANSFEMORAL;  Surgeon: Kathleene Hazel, MD;  Location: [facility] OR;  Service: Open Heart Surgery;  Laterality: N/A;   Social History:  reports that he quit smoking about 61 years ago. His smoking  use included cigarettes. He started smoking about 74 years ago. He has a 45.5 pack-year smoking history. He has never used smokeless tobacco. He reports that he does not currently use alcohol after a past usage of about 7.0 standard drinks of alcohol per week. He reports that he does not use drugs.  Allergies  Allergen Reactions   Peanut-Containing Drug Products Anaphylaxis   Sulfonamide Derivatives Anaphylaxis   Amlodipine Swelling    Swelling in ankles   Eliquis [Apixaban] Other (See Comments)    Back/hip pain   Lisinopril Cough    cough   Xarelto [Rivaroxaban] Other (See Comments)    Back/hip pain    Family History  Problem Relation Age of Onset   Heart attack Father 24   Allergic rhinitis Father    Asthma Father    Heart failure Mother 38   Uterine cancer Mother    Breast cancer Mother    Colon cancer Neg Hx  Esophageal cancer Neg Hx     Prior to Admission medications   Medication Sig Start Date End Date Taking? Authorizing Provider  albuterol (VENTOLIN HFA) 108 (90 Base) MCG/ACT inhaler Inhale 2 puffs into the lungs every 4 (four) hours as needed for wheezing or shortness of breath. 05/06/23   Nelwyn Salisbury, MD  allopurinol (ZYLOPRIM) 100 MG tablet Take 1 tablet (100 mg total) by mouth daily. 10/18/22   Philip Aspen, Limmie Patricia, MD  amiodarone (PACERONE) 200 MG tablet Take 2 tablets by mouth twice daily x 2 weeks then decrease to 2 tablets by mouth daily x 2 weeks Patient taking differently: Take 200 mg by mouth daily. 11/13/22   Duke Salvia, MD  aspirin 81 MG tablet Take 1 tablet (81 mg total) by mouth daily. 05/19/19   Magnant, Charles L, PA-C  atorvastatin (LIPITOR) 20 MG tablet TAKE ONE TABLET ONCE DAILY 10/18/22   Philip Aspen, Limmie Patricia, MD  azithromycin [facility] Z-PAK) 250 MG tablet As directed 05/06/23   Nelwyn Salisbury, MD  benzonatate (TESSALON) 200 MG capsule Take 1 capsule (200 mg total) by mouth 2 (two) times daily as needed for cough. 05/06/23   Nelwyn Salisbury, MD  cetirizine (ZYRTEC) 10 MG tablet Take 10 mg by mouth 2 (two) times daily. [facility]    [provider]  colchicine 0.6 MG tablet Take 1 tablet (0.6 mg total) by mouth every 6 (six) hours as needed (gout). 10/30/21 11/25/23  Nelwyn Salisbury, MD  dabigatran (PRADAXA) 150 MG CAPS capsule TAKE 1 CAPSULE BY MOUTH TWICE  DAILY 12/18/22   Nelwyn Salisbury, MD  digoxin (LANOXIN) 0.125 MG tablet Take 1 tablet (125 mcg total) by mouth every other day. 05/30/23 05/29/24  Duke Salvia, MD  diphenhydrAMINE (BENADRYL) 25 MG tablet Take 25 mg by mouth daily as needed for allergies.    [provider]  fluorouracil (EFUDEX) 5 % cream Apply 1 application  topically 2 (two) times daily as needed (rash).    [provider]  isosorbide mononitrate (IMDUR) 30 MG 24 hr tablet Take 1 tablet (30 mg total) by mouth at bedtime. 06/10/23   Duke Salvia, MD  ketotifen (ZADITOR) 0.025 % ophthalmic solution Apply 1 drop to eye daily as needed (itchy eyes).     [provider]  levothyroxine (SYNTHROID) 200 MCG tablet Take 1 tablet (200 mcg total) by mouth daily. 12/25/22   Nelwyn Salisbury, MD  methocarbamol (ROBAXIN) 500 MG tablet Take 1 tablet (500 mg total) by mouth 4 (four) times daily. Patient taking differently: Take 500 mg by mouth every 6 (six) hours as needed for muscle spasms. 09/13/22   London Sheer, MD  metoCLOPramide (REGLAN) 10 MG tablet TAKE ONE TABLET BY MOUTH EVERY MORNING WITH BREAKFAST 04/14/23   Nelwyn Salisbury, MD  metolazone (ZAROXOLYN) 2.5 MG tablet Take 1 tablet (2.5 mg total) by mouth once a week. Take 1 tablet every Saturday 02/18/23   Graciella Freer, PA-C  metoprolol succinate (TOPROL-XL) 50 MG 24 hr tablet Take 2 tablets (100 mg total) by mouth daily. Take with or immediately following a meal. 05/30/23   Duke Salvia, MD  montelukast (SINGULAIR) 10 MG tablet TAKE ONE TABLET BY MOUTH ONCE DAILY IN THE EVENING 08/20/22   Hunsucker, Lesia Sago, MD   Multiple Minerals-Vitamins (CAL MAG ZINC +D3 PO) Take 2 tablets by mouth daily. 500 mg / 7 mg / 25 mcg    [provider]  Multiple Vitamin (MULTIVITAMIN) capsule Take 2 capsules by mouth daily. gummy    [provider]  Multiple Vitamins-Minerals (ICAPS AREDS FORMULA PO) Take 1 tablet by mouth 2 (two) times daily.    [provider]  omeprazole (PRILOSEC) 20 MG capsule TAKE ONE CAPSULE TWICE DAILY 03/17/23   Nelwyn Salisbury, MD  potassium chloride SA (KLOR-CON M) 20 MEQ tablet Take 1 tablet (20 mEq total) by mouth 2 (two) times daily. 02/18/23   Graciella Freer, PA-C  sertraline (ZOLOFT) 50 MG tablet Take 1 tablet (50 mg total) by mouth daily. 04/02/23   Nelwyn Salisbury, MD  silver sulfADIAZINE (SILVADENE) 1 % cream Apply 1 Application topically 2 (two) times daily. 05/14/23   Nelwyn Salisbury, MD  torsemide (DEMADEX) 20 MG tablet Take 2 tablets (40 mg total) every other day, alternating with 1 tablet (20 mg total) every other day. Patient taking differently: Take 20-40 mg by mouth See admin instructions. Take 2 tablets (40 mg total) every other day, alternating with 1 tablet (20 mg total) every other day. 02/18/23   Graciella Freer, PA-C  triamcinolone cream (KENALOG) 0.1 % Apply 1 application  topically daily as needed for dry skin. 04/15/17   [provider]  valACYclovir (VALTREX) 500 MG tablet Take 500 mg by mouth 2 (two) times daily as needed (cold sores).    [provider]    Physical Exam: Vitals:   06/30/23 1301 06/30/23 1415 06/30/23 1530  BP: (!) 131/90 135/67 128/78  Pulse: 81 80 81  Resp: 18 18 18   Temp: 97.8 F (36.6 C)    TempSrc: Oral    SpO2: 99% 99% 98%  General: Pleasant, well-appearing obese man laying in bed. No acute distress. Neck: Supple. JVD difficult to assess.  CV: Ventricularly paced rhythm. Mechanical heart sounds. 1-2+ BLE pitting edema up to knee. Pulmonary: Lungs CTAB. Normal effort. Decreased air movement  throughout. Bibasilar rales.  Mild expiratory wheezing. Abdominal: Soft, nontender, nondistended. Normal bowel sounds. Extremities: Palpable radial and DP pulses. Normal ROM. Skin: Warm and dry. No obvious rash or lesions. Neuro: A&Ox3. Moves all extremities. Normal sensation. No focal deficit. Psych: Normal mood and affect  Data Reviewed:  EKG shows ventricularly paced rhythm, HR 92 Labs show WBC 11.5, Hgb 12.5, K+ 4.0, creatinine 1.14, BNP 392, troponin 16. CXR showed minimal bibasilar subsegmental atelectasis with minimal pleural effusions. Patient received IV Lasix 60 mg x 1.    Assessment and Plan:  Justin [name] is a 89 y.o. male with medical history significant of h h/o VT, SND s/p PPM, Persistent AF/AFL, aortic stenosis s/p TAVR, CAD s/p CABG, obesity, PVCs, age-related macular degeneration, BPH, hypothyroidism, HTN, HLD, chronic systolic CHF, GERD, gout and asthma who presented to the ED for evaluation of shortness of breath and admitted for heart failure exacerbation.  # Acute exacerbation of systolic heart failure Patient with recent pacemaker replacement 1 month ago presented with progressive dyspnea on exertion and fatigue. He is compliant with his home torsemide 40 mg alternating with 20 mg every other day with metolazone 2.5 mg once weekly. He denies any dietary indiscretion. BNP elevated to about 400. Weight is 266 lbs on admission.  Unclear current dry weight but reported dry weight of 255 pounds about 4 years ago. Patient is fluid overloaded on exam but able to maintain O2 sats above 95% on room air. Cardiology has been consulted and plans to see patient in the morning. Will admit to telemetry bed  for IV diuresing. -Cardiology consulted, appreciate recs -IV Lasix 60 mg today, adjust in a.m. based on response -Continue metoprolol 100 mg daily -Continue KCl 20 mEq twice daily -Repeat TTE to assess systolic function -Strict I&O's, daily weights  # Sinus node dysfunction s/p  PPM # Persistent A-fib/A-flutter # Hx of frequent PVCs Pacemaker was exchanged about a month ago and since then, patient reports increased fatigue and a recent dyspnea on exertion. EKG with ventricularly paced rhythm.  Occasional extra beat on heart auscultation. Denies any syncope, chest pain, dizziness or palpitations.  No electrolyte abnormalities on lab. -Amiodarone 200 mg daily -Dabigatran 150 mg every 12 hours -Digoxin 125 mcg every other day -Follow-up mag, Phos  # HTN Normotensive with SBP in the 120s to 130s. -Imdur 30 mg daily at bedtime -Metoprolol 100 mg daily  # Normocytic anemia Patient's hemoglobin down to 12.5 from 14 about a month ago.  He is presenting with increased fatigue and dyspnea on exertion.  He denies any bleeding episodes.  Patient's anemia likely contributing to his current presentation.  Will check for iron deficiency anemia. -Check iron studies, ferritin -Trend CBC  # HLD # CAD s/p CABG Patient denies any chest pain.  Troponins are normal and flat. -Aspirin 81 mg daily -Atorvastatin 20 mg daily  # Asthma # Allergies Mild expiratory wheezing on exam. -Daily Singulair and loratadine -As needed Benadryl, Tessalon perle, DuoNebs and albuterol  # Hypothyroidism Repeat TSH and free T4 as patient is also on amiodarone. -Follow-up TSH, free T4 -Levothyroxine 200 mcg daily  # GERD -Protonix 40 mg daily  # Gout -Allopurinol 100 mg daily    Advance Care Planning:   Code Status: Limited: Do not attempt resuscitation (DNR) -DNR-LIMITED -Do Not Intubate/DNI    Consults: Cardiology  Family Communication: Discussed admission with patient's daughters at bedside.  Severity of Illness: The appropriate patient status for this patient is INPATIENT. Inpatient status is judged to be reasonable and necessary in order to provide the required intensity of service to ensure the patient's safety. The patient's presenting symptoms, physical exam findings, and  initial radiographic and laboratory data in the context of their chronic comorbidities is felt to place them at high risk for further clinical deterioration. Furthermore, it is not anticipated that the patient will be medically stable for discharge from the hospital within 2 midnights of admission.   * I certify that at the point of admission it is my clinical judgment that the patient will require inpatient hospital care spanning beyond 2 midnights from the point of admission due to high intensity of service, high risk for further deterioration and high frequency of surveillance required.*  Author: Steffanie Rainwater, MD 06/30/2023 4:27 PM  For on call review [email or web address].

## 2023-06-30 NOTE — Telephone Encounter (Signed)
Pt c/o Shortness Of Breath: STAT if SOB developed within the last 24 hours or pt is noticeably SOB on the phone  1. Are you currently SOB (can you hear that pt is SOB on the phone)? Yes, can not hear over the phone  2. How long have you been experiencing SOB? Has gotten progressively worse 3-4 days   3. Are you SOB when sitting or when up moving around? Moving around  4. Are you currently experiencing any other symptoms? Cough and constantly sleeping.   Patient's daughter states this was the same symptoms when patient previously had congestive heart failure. Please advise.

## 2023-06-30 NOTE — ED Triage Notes (Signed)
POV via wheelchair. C/o SOB, lethergic that has been progressively worse x 1 week s/p defib/pacemaker insertion 3 weeks ago with Dr. Clide Cliff. Unable to take a deep breath,Worse on exertion. Family says he slept all weekend. Did home Covid today, negative.

## 2023-06-30 NOTE — Telephone Encounter (Signed)
I spoke with patient's daughter.  She reports patient's shortness of breath started last Tuesday or Wednesday.  Increased Thursday and worsened on Friday.  Shortness of breath continued over the weekend.  Daughter reports patient slept all day Friday, Saturday and Sunday. He has shortness of breath when walking 5-6 feet.  Negative for covid.  No fever. Has been wheezing.  Using ventolin inhaler but it is not helping.  Does not weigh daily.  Has swelling in both lower legs.  Left greater than the right.  Cough is non productive. Remote pacer check scheduled for today. I told daughter I would check with device clinic to see if this has been received.  Has been taking all his heart medications.  Also has appointment with pharmacist later today to review medications.  Daughter feels patient may need to go to ED if he can't be seen today.  I explained to patient's daughter if patient was feeling that poorly he should go to ED for evaluation as he may need more treatment than could be provided in office.   Daughter reports patient just reported he is having pain on his left side and trouble catching his breath.  I advised her that patient needs to be evaluated in ED and to call 911 if needed. I asked her to have patient bring all his current medications with him so they would know what he was currently taking when seen in ED

## 2023-06-30 NOTE — ED Provider Triage Note (Signed)
Emergency Medicine Provider Triage Evaluation Note  Justin [name] , a 89 y.o. male  was evaluated in triage.  Pt complains of shortness of breath over the past 3 weeks with nonproductive cough that has been significantly worsened over the past 3 days with associated pedal edema.  Patient had his pacemaker changed 3 weeks ago.  Review of Systems  Positive: SOB, pedal edema Negative: Fever  Physical Exam  BP (!) 131/90 (BP Location: Left Arm)   Pulse 81   Temp 97.8 F (36.6 C) (Oral)   Resp 18   SpO2 99%  Gen:   Awake, no distress   Resp:  Normal effort  MSK:   Moves extremities without difficulty  Other:    Medical Decision Making  Medically screening exam initiated at 1:18 PM.  Appropriate orders placed.  Justin [name] was informed that the remainder of the evaluation will be completed by another provider, this initial triage assessment does not replace that evaluation, and the importance of remaining in the ED until their evaluation is complete.  Labwork ordered   Judithann Sheen, PA 06/30/23 1319

## 2023-06-30 NOTE — ED Provider Triage Note (Deleted)
Emergency Medicine Provider Triage Evaluation Note  Justin [name] , a 89 y.o. male  was evaluated in triage.  Pt complains of vaginal pruritus, vaginal bleeding over the past 3 weeks.  She reports that she was told that her ex-boyfriend has chlamydia 3 days ago and she has been exposed.  Review of Systems  Positive: Vaginal pruritus, vaginal bleeding Negative: Lightheadedness, syncope, seizures, fever, vaginal discharge  Physical Exam  BP (!) 131/90 (BP Location: Left Arm)   Pulse 81   Temp 97.8 F (36.6 C) (Oral)   Resp 18   SpO2 99%  Gen:   Awake, no distress   Resp:  Normal effort  MSK:   Moves extremities without difficulty  Other:    Medical Decision Making  Medically screening exam initiated at 1:16 PM.  Appropriate orders placed.  Justin [name] was informed that the remainder of the evaluation will be completed by another provider, this initial triage assessment does not replace that evaluation, and the importance of remaining in the ED until their evaluation is complete.  Labwork ordered   Judithann Sheen, PA 06/30/23 1318

## 2023-06-30 NOTE — ED Notes (Signed)
ED TO INPATIENT HANDOFF REPORT  ED Nurse Name and Phone #: Lona Kettle Name/Age/Gender Justin [name] 89 y.o. male Room/Bed: WA24/WA24  Code Status   Code Status: Limited: Do not attempt resuscitation (DNR) -DNR-LIMITED -Do Not Intubate/DNI   Home/SNF/Other Home Patient oriented to: self, place, time, and situation Is this baseline? Yes   Triage Complete: Triage complete  Chief Complaint Acute exacerbation of congestive heart failure (HCC) [I50.9]  Triage Note POV via wheelchair. C/o SOB, lethergic that has been progressively worse x 1 week s/p defib/pacemaker insertion 3 weeks ago with Dr. Clide Cliff. Unable to take a deep breath,Worse on exertion. Family says he slept all weekend. Did home Covid today, negative.   Allergies Allergies  Allergen Reactions   Peanut-Containing Drug Products Anaphylaxis   Sulfonamide Derivatives Anaphylaxis   Amlodipine Swelling    Swelling in ankles   Eliquis [Apixaban] Other (See Comments)    Back/hip pain   Lisinopril Cough    cough   Xarelto [Rivaroxaban] Other (See Comments)    Back/hip pain    Level of Care/Admitting Diagnosis ED Disposition     ED Disposition  Admit   Condition  --   Comment  Hospital Area: [facility] [[id number]]  Level of Care: Telemetry [5]  Admit to tele based on following criteria: Acute CHF  May admit patient to [facility] or [facility] if equivalent level of care is available:: No  Covid Evaluation: Asymptomatic - no recent exposure (last 10 days) testing not required  Diagnosis: Acute exacerbation of congestive heart failure [facility]) [[id number]]  Admitting Physician: Steffanie Rainwater [[id number]]  Attending Physician: Steffanie Rainwater [[id number]]  Certification:: I certify this patient will need inpatient services for at least 2 midnights  Expected Medical Readiness: 07/03/2023          B Medical/Surgery History Past Medical History:  Diagnosis Date   Age-related macular  degeneration, dry, both eyes    Allergic    "24/7; 365 days/year; I'm allergic to pollens, dust, all southern grasses/trees, mold, mildue, cats, dogs" (01/07/2017)   Anal fissure    Asthma    sees Dr. Kendrick Fries    Benign prostatic hypertrophy    (sees Dr. Chester Holstein   CAD (coronary artery disease)    a. s/p CABG 2007. b. Cath 08/2016 - 4/4 patent grafts.   Carotid bruit    carotid u/s 10/10: 0.39% bilaterally   Chronic combined systolic and diastolic CHF (congestive heart failure) (HCC)    Complication of anesthesia 1980s   "w/anal cyst OR, he gave me a saddle block then put a narcotic in spinal cord; had a severe reaction to that" (01/07/2017)   Congestive heart failure (CHF) [facility])    ED (erectile dysfunction)    Family history of adverse reaction to anesthesia    "daughter wakes up during OR" (01/07/2017)   GERD (gastroesophageal reflux disease)    Gout    HTN (hypertension)    Hx of colonic polyps    (sees Dr. Marina Goodell)   Hyperlipidemia    Hypothyroidism    Moderate to severe aortic stenosis    a. s/p TAVR 02/2017.   Myocardial infarction [facility]) ~ 2000   Obesity    Osteoarthritis    "was in my knees, hands" (01/07/2017 )   PAF (paroxysmal atrial fibrillation) (HCC)    a. documented post TAVR.   Precancerous skin lesion    (sees Dr. 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   29 mm Edwards Sapien 3 transcatheter heart valve placed via percutaneous right transfemoral approach   Past Surgical History:  Procedure Laterality Date   ANUS SURGERY     "opened it back up cause it wouldn't heal; wound up w/a fissure" (01/07/2017)   BIV ICD INSERTION CRT-D N/A 05/02/2017   Procedure: BIV ICD INSERTION CRT-D;  Surgeon: Duke Salvia, MD;  Location: [facility] INVASIVE CV LAB;  Service: Cardiovascular;  Laterality: N/A;   CARDIAC CATHETERIZATION  10/29/2005   CARDIAC CATHETERIZATION N/A 08/27/2016   Procedure:  Right/Left Heart Cath and Coronary/Graft Angiography;  Surgeon: Kathleene Hazel, MD;  Location: [facility] INVASIVE CV LAB;  Service: Cardiovascular;  Laterality: N/A;   CARDIOVERSION N/A 04/24/2021   Procedure: CARDIOVERSION;  Surgeon: Pricilla Riffle, MD;  Location: [facility] ENDOSCOPY;  Service: Cardiovascular;  Laterality: N/A;   CARDIOVERSION N/A 11/08/2021   Procedure: CARDIOVERSION;  Surgeon: Chrystie Nose, MD;  Location: [facility] ENDOSCOPY;  Service: Cardiovascular;  Laterality: N/A;   CARDIOVERSION N/A 02/06/2022   Procedure: CARDIOVERSION;  Surgeon: Wendall Stade, MD;  Location: [facility] ENDOSCOPY;  Service: Cardiovascular;  Laterality: N/A;   CATARACT EXTRACTION W/ INTRAOCULAR LENS  IMPLANT, BILATERAL Bilateral    CATARACT EXTRACTION, BILATERAL  2012   COLONOSCOPY  06/30/2008   no repeats needed    COLONOSCOPY     had 3 or 4 in the past    CORONARY ARTERY BYPASS GRAFT  2007   "CABG X4"   CYST EXCISION PERINEAL  1980s   HAMMER TOE SURGERY Bilateral    ICD GENERATOR CHANGEOUT N/A 05/30/2023   Procedure: ICD GENERATOR CHANGEOUT;  Surgeon: Duke Salvia, MD;  Location: [facility] INVASIVE CV LAB;  Service: Cardiovascular;  Laterality: N/A;   JOINT REPLACEMENT     KNEE ARTHROPLASTY  07/30/2011   Procedure: COMPUTER ASSISTED TOTAL KNEE ARTHROPLASTY;  Surgeon: Cammy Copa;  Location: [facility] OR;  Service: Orthopedics;  Laterality: Left;  left total knee arthroplasty   MASTECTOMY SUBCUTANEOUS Bilateral    MULTIPLE TOOTH EXTRACTIONS     ORIF FINGER / THUMB FRACTURE Right ~ 1980   "repair of thumb injury"   PROSTATE BIOPSY     REPLACEMENT TOTAL KNEE BILATERAL Bilateral 2012   TEE WITHOUT CARDIOVERSION N/A 03/04/2017   Procedure: TRANSESOPHAGEAL ECHOCARDIOGRAM (TEE);  Surgeon: Kathleene Hazel, MD;  Location: [facility] OR;  Service: Open Heart Surgery;  Laterality: N/A;   TOTAL KNEE REVISION Right 05/18/2019   Procedure: RIGHT PATELLA REVISION/REMOVAL;  Surgeon: Cammy Copa, MD;  Location: [facility] OR;  Service:  Orthopedics;  Laterality: Right;   TRANSCATHETER AORTIC VALVE REPLACEMENT, TRANSFEMORAL N/A 03/04/2017   Procedure: TRANSCATHETER AORTIC VALVE REPLACEMENT, TRANSFEMORAL;  Surgeon: Kathleene Hazel, MD;  Location: [facility] OR;  Service: Open Heart Surgery;  Laterality: N/A;     A IV Location/Drains/Wounds Patient Lines/Drains/Airways Status     Active Line/Drains/Airways     Name Placement date Placement time Site Days   Peripheral IV 06/30/23 20 G Left Antecubital 06/30/23  1400  Antecubital  less than 1            Intake/Output Last 24 hours No intake or output data in the 24 hours ending 06/30/23 1735  Labs/Imaging Results for orders placed or performed during the hospital encounter of 06/30/23 (from the past 48 hour(s))  CBC with Differential     Status: Abnormal   Collection Time: 06/30/23  1:21 PM  Result Value Ref Range  WBC 11.5 (H) 4.0 - 10.5 K/uL   RBC 4.75 4.22 - 5.81 MIL/uL   Hemoglobin 12.5 (L) 13.0 - 17.0 g/dL   HCT 40.9 81.1 - 91.4 %   MCV 87.6 80.0 - 100.0 fL   MCH 26.3 26.0 - 34.0 pg   MCHC 30.0 30.0 - 36.0 g/dL   RDW 78.2 (H) 95.6 - 21.3 %   Platelets 313 150 - 400 K/uL   nRBC 0.0 0.0 - 0.2 %   Neutrophils Relative % 77 %   Neutro Abs 8.9 (H) 1.7 - 7.7 K/uL   Lymphocytes Relative 12 %   Lymphs Abs 1.3 0.7 - 4.0 K/uL   Monocytes Relative 8 %   Monocytes Absolute 1.0 0.1 - 1.0 K/uL   Eosinophils Relative 2 %   Eosinophils Absolute 0.2 0.0 - 0.5 K/uL   Basophils Relative 1 %   Basophils Absolute 0.1 0.0 - 0.1 K/uL   Immature Granulocytes 0 %   Abs Immature Granulocytes 0.04 0.00 - 0.07 K/uL    Comment: Performed at [facility], [address], [place], [place] [address]  Comprehensive metabolic panel     Status: Abnormal   Collection Time: 06/30/23  1:21 PM  Result Value Ref Range   Sodium 140 135 - 145 mmol/L   Potassium 4.0 3.5 - 5.1 mmol/L   Chloride 107 98 - 111 mmol/L   CO2 24 22 - 32 mmol/L   Glucose, Bld 133 (H) 70 -  99 mg/dL    Comment: Glucose reference range applies only to samples taken after fasting for at least 8 hours.   BUN 14 8 - 23 mg/dL   Creatinine, Ser 8.46 0.61 - 1.24 mg/dL   Calcium 8.2 (L) 8.9 - 10.3 mg/dL   Total Protein 7.0 6.5 - 8.1 g/dL   Albumin 2.7 (L) 3.5 - 5.0 g/dL   AST 30 15 - 41 U/L   ALT 26 0 - 44 U/L   Alkaline Phosphatase 132 (H) 38 - 126 U/L   Total Bilirubin 1.1 <1.2 mg/dL   GFR, Estimated >96 >29 mL/min    Comment: (NOTE) Calculated using the CKD-EPI Creatinine Equation (2021)    Anion gap 9 5 - 15    Comment: Performed at [facility], [address], [place], [place] [address]  Brain natriuretic peptide     Status: Abnormal   Collection Time: 06/30/23  1:21 PM  Result Value Ref Range   B Natriuretic Peptide 392.9 (H) 0.0 - 100.0 pg/mL    Comment: Performed at [facility], [address], [place], [place] [address]  Troponin I (High Sensitivity)     Status: None   Collection Time: 06/30/23  1:21 PM  Result Value Ref Range   Troponin I (High Sensitivity) 16 <18 ng/L    Comment: (NOTE) Elevated high sensitivity troponin I (hsTnI) values and significant  changes across serial measurements may suggest ACS but many other  chronic and acute conditions are known to elevate hsTnI results.  Refer to the "Links" section for chest pain algorithms and additional  guidance. Performed at [name], [address], [place], [place] [address]   Digoxin level     Status: Abnormal   Collection Time: 06/30/23  2:01 PM  Result Value Ref Range   Digoxin Level <0.4 (L) 0.8 - 2.0 ng/mL    Comment: RESULT CONFIRMED BY MANUAL DILUTION Performed at [facility], [address] [place], [place] [address]    *  Note: Due to a large number of results and/or encounters for the requested time period, some results have not been displayed. A complete set of results can be found in Results Review.   DG  Chest 2 View  Result Date: 06/30/2023 CLINICAL DATA:  Shortness of breath. EXAM: CHEST - 2 VIEW COMPARISON:  October 27, 2020. FINDINGS: Stable cardiomegaly. Sternotomy wires are noted. Left-sided defibrillator is unchanged. Minimal bibasilar subsegmental atelectasis is noted with minimal pleural effusions. Bony thorax is unremarkable. IMPRESSION: Minimal bibasilar subsegmental atelectasis with minimal pleural effusions. Electronically Signed   By: Lupita Raider M.D.   On: 06/30/2023 16:01    Pending Labs Unresulted Labs (From admission, onward)     Start     Ordered   06/30/23 1645  VITAMIN D 25 Hydroxy (Vit-D Deficiency, Fractures)  Once,   R        06/30/23 1645   06/30/23 1630  TSH  Once,   R        06/30/23 1630   06/30/23 1630  T4, free  Once,   R        06/30/23 1630   06/30/23 1630  Iron and TIBC  Once,   R        06/30/23 1630   06/30/23 1630  Ferritin  Once,   R        06/30/23 1630   06/30/23 1616  Magnesium  Once,   R        06/30/23 1616   06/30/23 1616  Phosphorus  Once,   R        06/30/23 1616            Vitals/Pain Today's Vitals   06/30/23 1301 06/30/23 1415 06/30/23 1530 06/30/23 1630  BP: (!) 131/90 135/67 128/78 (!) 132/90  Pulse: 81 80 81 82  Resp: 18 18 18 18   Temp: 97.8 F (36.6 C)   97.9 F (36.6 C)  TempSrc: Oral     SpO2: 99% 99% 98% 97%  PainSc: 0-No pain       Isolation Precautions No active isolations  Medications Medications  acetaminophen (TYLENOL) tablet 650 mg (has no administration in time range)    Or  acetaminophen (TYLENOL) suppository 650 mg (has no administration in time range)  senna-docusate (Senokot-S) tablet 1 tablet (has no administration in time range)  ondansetron (ZOFRAN) tablet 4 mg (has no administration in time range)    Or  ondansetron (ZOFRAN) injection 4 mg (has no administration in time range)  albuterol (VENTOLIN HFA) 108 (90 Base) MCG/ACT inhaler 2 puff (has no administration in time range)  allopurinol  (ZYLOPRIM) tablet 100 mg (has no administration in time range)  amiodarone (PACERONE) tablet 200 mg (has no administration in time range)  aspirin tablet 81 mg (has no administration in time range)  atorvastatin (LIPITOR) tablet 20 mg (has no administration in time range)  benzonatate (TESSALON) capsule 200 mg (has no administration in time range)  loratadine (CLARITIN) tablet 10 mg (has no administration in time range)  dabigatran (PRADAXA) capsule 150 mg (has no administration in time range)  digoxin (LANOXIN) tablet 125 mcg (has no administration in time range)  diphenhydrAMINE (BENADRYL) tablet 25 mg (has no administration in time range)  fluorouracil (EFUDEX) 5 % cream 1 application  (has no administration in time range)  isosorbide mononitrate (IMDUR) 24 hr tablet 30 mg (has no administration in time range)  ketotifen (ZADITOR) 0.025 % ophthalmic solution 1 drop (has no administration in time  range)  levothyroxine (SYNTHROID) tablet 200 mcg (has no administration in time range)  methocarbamol (ROBAXIN) tablet 500 mg (has no administration in time range)  metoCLOPramide (REGLAN) tablet 10 mg (has no administration in time range)  metoprolol succinate (TOPROL-XL) 24 hr tablet 100 mg (has no administration in time range)  montelukast (SINGULAIR) tablet 10 mg (has no administration in time range)  multivitamin capsule 2 capsule (has no administration in time range)  pantoprazole (PROTONIX) EC tablet 40 mg (has no administration in time range)  potassium chloride SA (KLOR-CON M) CR tablet 20 mEq (has no administration in time range)  sertraline (ZOLOFT) tablet 50 mg (has no administration in time range)  furosemide (LASIX) injection 60 mg (60 mg Intravenous Given 06/30/23 1614)    Mobility walks with person assist     Focused Assessments Cardiac Assessment Handoff:    No results found for: "CKTOTAL", "CKMB", "CKMBINDEX", "TROPONINI" No results found for: "DDIMER" Does the Patient  currently have chest pain? No    R Recommendations: See Admitting Provider Note  Report given to:   Additional Notes: .

## 2023-06-30 NOTE — Telephone Encounter (Signed)
Remote transmission received and reviewed. Normal device function. No episodes noted. Af noted on presenting that started 05/30/23.

## 2023-07-01 ENCOUNTER — Inpatient Hospital Stay (HOSPITAL_COMMUNITY): Payer: Medicare Other

## 2023-07-01 DIAGNOSIS — R0609 Other forms of dyspnea: Secondary | ICD-10-CM

## 2023-07-01 DIAGNOSIS — I5043 Acute on chronic combined systolic (congestive) and diastolic (congestive) heart failure: Secondary | ICD-10-CM | POA: Diagnosis not present

## 2023-07-01 LAB — CBC
HCT: 36.9 % — ABNORMAL LOW (ref 39.0–52.0)
Hemoglobin: 11.2 g/dL — ABNORMAL LOW (ref 13.0–17.0)
MCH: 26.5 pg (ref 26.0–34.0)
MCHC: 30.4 g/dL (ref 30.0–36.0)
MCV: 87.4 fL (ref 80.0–100.0)
Platelets: 283 10*3/uL (ref 150–400)
RBC: 4.22 MIL/uL (ref 4.22–5.81)
RDW: 17.5 % — ABNORMAL HIGH (ref 11.5–15.5)
WBC: 11.9 10*3/uL — ABNORMAL HIGH (ref 4.0–10.5)
nRBC: 0 % (ref 0.0–0.2)

## 2023-07-01 LAB — T4, FREE: Free T4: 1.21 ng/dL — ABNORMAL HIGH (ref 0.61–1.12)

## 2023-07-01 LAB — PHOSPHORUS: Phosphorus: 3.3 mg/dL (ref 2.5–4.6)

## 2023-07-01 LAB — ECHOCARDIOGRAM COMPLETE
AR max vel: 2.53 cm2
AV Area VTI: 2.83 cm2
AV Area mean vel: 2.41 cm2
AV Mean grad: 7.6 mm[Hg]
AV Peak grad: 13.8 mm[Hg]
Ao pk vel: 1.86 m/s
Area-P 1/2: 3.88 cm2
Calc EF: 45.1 %
S' Lateral: 3.1 cm
Single Plane A2C EF: 30.7 %
Single Plane A4C EF: 54.2 %
Weight: 4326.4 [oz_av]

## 2023-07-01 LAB — GLUCOSE, CAPILLARY
Glucose-Capillary: 124 mg/dL — ABNORMAL HIGH (ref 70–99)
Glucose-Capillary: 109 mg/dL — ABNORMAL HIGH (ref 70–99)
Glucose-Capillary: 124 mg/dL — ABNORMAL HIGH (ref 70–99)

## 2023-07-01 LAB — BASIC METABOLIC PANEL
Anion gap: 10 (ref 5–15)
BUN: 14 mg/dL (ref 8–23)
CO2: 24 mmol/L (ref 22–32)
Calcium: 7.8 mg/dL — ABNORMAL LOW (ref 8.9–10.3)
Chloride: 106 mmol/L (ref 98–111)
Creatinine, Ser: 1.3 mg/dL — ABNORMAL HIGH (ref 0.61–1.24)
GFR, Estimated: 53 mL/min — ABNORMAL LOW (ref >60)
Glucose, Bld: 125 mg/dL — ABNORMAL HIGH (ref 70–99)
Potassium: 4.1 mmol/L (ref 3.5–5.1)
Sodium: 140 mmol/L (ref 135–145)

## 2023-07-01 MED ORDER — FUROSEMIDE 10 MG/ML IJ SOLN
60.0000 mg | Freq: Once | INTRAMUSCULAR | Status: AC
  Administered 2023-07-01: 60 mg via INTRAVENOUS
  Filled 2023-07-01: qty 6

## 2023-07-01 MED ORDER — POTASSIUM PHOSPHATES 15 MMOLE/5ML IV SOLN
30.0000 mmol | Freq: Once | INTRAVENOUS | Status: AC
  Administered 2023-07-01: 30 mmol via INTRAVENOUS
  Filled 2023-07-01: qty 10

## 2023-07-01 MED ORDER — PERFLUTREN LIPID MICROSPHERE
1.0000 mL | INTRAVENOUS | Status: AC | PRN
  Administered 2023-07-01: 6 mL via INTRAVENOUS

## 2023-07-01 MED ORDER — DAPAGLIFLOZIN PROPANEDIOL 10 MG PO TABS
10.0000 mg | ORAL_TABLET | Freq: Every day | ORAL | Status: DC
  Administered 2023-07-02 – 2023-07-06 (×5): 10 mg via ORAL
  Filled 2023-07-01 (×5): qty 1

## 2023-07-01 NOTE — Evaluation (Signed)
Physical Therapy Evaluation Patient Details Name: Justin [name] MRN: [id number] DOB: 1934/08/13 Today's Date: 07/01/2023  History of Present Illness  89 year old presented to the ER for progressive shortness of breath since he had pacemaker placed on October 4. c/o being  more fatigued and having dyspneic on exertion, and leg swelling. admitted with acute CHF. PMH: VT, sick sinus syndrome status post recent pacemaker placement, A-fib a flutter, aortic stenosis status post TAVR, CAD status post CABG, obesity, hypothyroidism and hypertension, chronic combined heart failure  Clinical Impression  Patient evaluated by Physical Therapy with no further acute PT needs identified. All education has been completed and the patient has no further questions.  Pt is independent at his baseline, other than DOE he feels mobility is at baseline; pt is in process of moving to [facility], living with dtr until room is ready. Wife is in SNF there currently. Pt amb ~ 350' without device, mild drifting noted however  no overt LOB. one brief standing rest, sat check with SpO2=97% on RA  See below for any follow-up Physical Therapy or equipment needs. PT is signing off. Thank you for this referral.         If plan is discharge home, recommend the following: Help with stairs or ramp for entrance   Can travel by private vehicle        Equipment Recommendations None recommended by PT  Recommendations for Other Services       Functional Status Assessment       Precautions / Restrictions Precautions Precautions: Fall Precaution Comments: reports one fall about one month ago      Mobility  Bed Mobility               General bed mobility comments: NT-pt in recliner and returned to same    Transfers     Transfers: Sit to/from Stand Sit to Stand: Modified independent (Device/Increase time)           General transfer comment: no physical assist, light use of UEs     Ambulation/Gait Ambulation/Gait assistance: Supervision Gait Distance (Feet): 350 Feet   Gait Pattern/deviations: Step-through pattern, Wide base of support, Drifts right/left Gait velocity: decr     General Gait Details: mild drifting, no overt LOB. one brief standing rest, sat check with SpO2=97% on RA  Stairs            Wheelchair Mobility     Tilt Bed    Modified Rankin (Stroke Patients Only)       Balance Overall balance assessment: Needs assistance         Standing balance support: No upper extremity supported Standing balance-Leahy Scale: Fair Standing balance comment: Fair+             High level balance activites: Side stepping, Sudden stops, Head turns High Level Balance Comments: close guarding, no LOB             Pertinent Vitals/Pain Pain Assessment Pain Assessment: No/denies pain    Home Living Family/patient expects to be discharged to:: Private residence Living Arrangements: Children;Other (Comment)   Type of Home: Independent living facility (pt is in the process of moving to [facility]) Home Access: Level entry       Home Layout: One level Home Equipment: Agricultural consultant (2 wheels);Cane - single point Additional Comments: pt living with his dtr until his "room" is ready and decorated at [facility]. pt's wife is in SNF at [facility]  Prior Function Prior Level of Function : Independent/Modified Independent                     Extremity/Trunk Assessment   Upper Extremity Assessment Upper Extremity Assessment: Overall WFL for tasks assessed    Lower Extremity Assessment Lower Extremity Assessment: Overall WFL for tasks assessed       Communication   Communication Communication: No apparent difficulties  Cognition Arousal: Alert Behavior During Therapy: WFL for tasks assessed/performed Overall Cognitive Status: Within Functional Limits for tasks assessed                                  General Comments: pleasant and cooperative        General Comments      Exercises     Assessment/Plan    PT Assessment Patient does not need any further PT services  PT Problem List         PT Treatment Interventions      PT Goals (Current goals can be found in the Care Plan section)  Acute Rehab PT Goals PT Goal Formulation: All assessment and education complete, DC therapy    Frequency       Co-evaluation               AM-PAC PT "6 Clicks" Mobility  Outcome Measure Help needed turning from your back to your side while in a flat bed without using bedrails?: A Little Help needed moving from lying on your back to sitting on the side of a flat bed without using bedrails?: None Help needed moving to and from a bed to a chair (including a wheelchair)?: None Help needed standing up from a chair using your arms (e.g., wheelchair or bedside chair)?: None Help needed to walk in hospital room?: None Help needed climbing 3-5 steps with a railing? : A Little 6 Click Score: 22    End of Session   Activity Tolerance: Patient tolerated treatment well Patient left: with call bell/phone within reach;in chair (no alarm on arrival)   PT Visit Diagnosis: Other abnormalities of gait and mobility (R26.89)    Time: 1610-9604 PT Time Calculation (min) (ACUTE ONLY): 16 min   Charges:   PT Evaluation $PT Eval Low Complexity: 1 Low   PT General Charges $$ ACUTE PT VISIT: 1 Visit         Tariq Pernell, PT  Acute Rehab Dept [facility]) [phone]  07/01/2023   [facility] 07/01/2023, 3:33 PM

## 2023-07-01 NOTE — Progress Notes (Signed)
Echocardiogram 2D Echocardiogram has been performed.  Justin [name] 07/01/2023, 9:14 AM

## 2023-07-01 NOTE — Progress Notes (Signed)
PROGRESS NOTE    KASPER MUDRICK  WJX:[id number] DOB: February 23, 1934 DOA: 06/30/2023 PCP: Nelwyn Salisbury, MD    Brief Narrative:  89 year old with history of VT, sick sinus syndrome status post recent pacemaker placement, A-fib a flutter, aortic stenosis status post TAVR, CAD status post CABG, obesity, hypothyroidism and hypertension, chronic combined heart failure presented to the ER for progressive shortness of breath since he had pacemaker placed on October 4.  He has been more fatigued and dyspneic on exertion.  Also had leg swelling.  In the emergency room hemodynamically stable.  On room air.  Creatinine 1.14.  BNP 392.  Troponin 16.  Chest x-ray with bibasilar subsegmental atelectasis with minimal pleural effusion.  Patient was admitted for CHF exacerbation.  Subjective: Patient seen and examined.  I examined patient in the morning rounds.  He tells me he feels slightly better than yesterday.  I helped him get to the chair from the bed and he did fairly well.  He was on room air.   Assessment & Plan:   Acute on chronic, right heart failure: Previous history of type I diastolic dysfunction.  Echocardiogram today shows ejection fraction 45 to 50%. Acute congestive heart failure: Presented with orthopnea, leg edema, cardiomegaly on x-ray.  Increasing shortness of breath. Patient is on IV diuretics and responding with Urine output not exactly measured however patient explained of multiple urinations. Redosing Lasix 60 mg once today. Patient is on digoxin, Imdur, metoprolol XL 100 mg.  Followed by cardiology.  Restricted GDMT.  Follow-up renal function test.  Slight worsening creatinine today.  Chronic medical issues including Persistent A-fib flutter, frequent PVCs, sick sinus syndrome status post pacemaker: Currently paced rhythm.  Stable. On amiodarone, Pradaxa, digoxin.  Electrolytes are adequate.  Keep on potassium replacement. Essential hypertension, stable on nitrates and  metoprolol. Hyperlipidemia, on atorvastatin 20 Coronary artery disease status post CABG, on aspirin as well as Pradaxa. Hypothyroidism, on levothyroxine 200 GERD, on Protonix Gout, on allopurinol  Mobilize with PT OT.     DVT prophylaxis:  dabigatran (PRADAXA) capsule 150 mg   Code Status: DNR Family Communication: None at the bedside Disposition Plan: Status is: Inpatient Remains inpatient appropriate because: Significant fluid overload, on IV diuresis     Consultants:  Cardiology  Procedures:  Echocardiogram  Antimicrobials:  None     Objective: Vitals:   07/01/23 0157 07/01/23 0526 07/01/23 0531 07/01/23 1232  BP: (!) 140/85 129/72  130/77  Pulse: (!) 103 98  75  Resp: 16 20  20   Temp: 97.9 F (36.6 C) 97.7 F (36.5 C)  97.6 F (36.4 C)  TempSrc: Oral Oral  Oral  SpO2: 94% 96%  98%  Weight:   122.7 kg     Intake/Output Summary (Last 24 hours) at 07/01/2023 1323 Last data filed at 07/01/2023 0819 Gross per 24 hour  Intake 446.43 ml  Output --  Net 446.43 ml   Filed Weights   06/30/23 1808 07/01/23 0531  Weight: 120.7 kg 122.7 kg    Examination:  General exam: Appears calm and comfortable on room air. Respiratory system: Clear to auscultation. Respiratory effort normal.  Difficult to appreciate due to obesity however no additional sounds. Cardiovascular system: S1 & S2 heard, RRR. No JVD, murmurs, rubs, gallops or clicks.  Bilateral pedal edema.  Left more than the right.  Some tenderness left calf. Gastrointestinal system: Abdomen is nondistended, soft and nontender. No organomegaly or masses felt. Normal bowel sounds heard.  Obese and pendulous. Central nervous  system: Alert and oriented. No focal neurological deficits.   Data Reviewed: I have personally reviewed following labs and imaging studies  CBC: Recent Labs  Lab 06/30/23 1321 07/01/23 0355  WBC 11.5* 11.9*  NEUTROABS 8.9*  --   HGB 12.5* 11.2*  HCT 41.6 36.9*  MCV 87.6 87.4   PLT 313 283   Basic Metabolic Panel: Recent Labs  Lab 06/30/23 1321 06/30/23 1616 07/01/23 0355  NA 140  --  140  K 4.0  --  4.1  CL 107  --  106  CO2 24  --  24  GLUCOSE 133*  --  125*  BUN 14  --  14  CREATININE 1.14  --  1.30*  CALCIUM 8.2*  --  7.8*  MG  --  2.7*  --   PHOS  --  2.3* 3.3   GFR: Estimated Creatinine Clearance: 49.5 mL/min (A) (by C-G formula based on SCr of 1.3 mg/dL (H)). Liver Function Tests: Recent Labs  Lab 06/30/23 1321  AST 30  ALT 26  ALKPHOS 132*  BILITOT 1.1  PROT 7.0  ALBUMIN 2.7*   No results for input(s): "LIPASE", "AMYLASE" in the last 168 hours. No results for input(s): "AMMONIA" in the last 168 hours. Coagulation Profile: No results for input(s): "INR", "PROTIME" in the last 168 hours. Cardiac Enzymes: No results for input(s): "CKTOTAL", "CKMB", "CKMBINDEX", "TROPONINI" in the last 168 hours. BNP (last 3 results) Recent Labs    10/16/22 1259  PROBNP 2,293*   HbA1C: No results for input(s): "HGBA1C" in the last 72 hours. CBG: Recent Labs  Lab 07/01/23 0730 07/01/23 1113  GLUCAP 124* 109*   Lipid Profile: No results for input(s): "CHOL", "HDL", "LDLCALC", "TRIG", "CHOLHDL", "LDLDIRECT" in the last 72 hours. Thyroid Function Tests: Recent Labs    06/30/23 1637  TSH 9.303*  FREET4 1.21*   Anemia Panel: Recent Labs    06/30/23 1637  FERRITIN 28  TIBC 400  IRON 151   Sepsis Labs: No results for input(s): "PROCALCITON", "LATICACIDVEN" in the last 168 hours.  No results found for this or any previous visit (from the past 240 hour(s)).       Radiology Studies: ECHOCARDIOGRAM COMPLETE  Result Date: 07/01/2023    ECHOCARDIOGRAM REPORT   Patient Name:   BARBARA KENG Date of Exam: 07/01/2023 Medical Rec #:  [id number]        Height:       66.0 in Accession #:    [id number]       Weight:       270.4 lb Date of Birth:  1934-05-19       BSA:          2.273 m Patient Age:    89 years         BP:           129/72  mmHg Patient Gender: M                HR:           85 bpm. Exam Location:  Inpatient Procedure: 2D Echo, Cardiac Doppler, Color Doppler and Intracardiac            Opacification Agent Indications:    Dyspnea  History:        Patient has prior history of Echocardiogram examinations, most                 recent 11/12/2022. CHF, CAD, Prior CABG, Prior Cardiac Surgery,  Pacemaker and Defibrillator, Arrythmias:Atrial Fibrillation;                 Risk Factors:Former Smoker.                 Aortic Valve: 29 mm Edwards Sapien prosthetic, stented (TAVR)                 valve is present in the aortic position. Procedure Date:                 03/04/2017.  Sonographer:    Karma Ganja Referring Phys: [id number] PROSPER M AMPONSAH  Sonographer Comments: Technically difficult study due to poor echo windows and patient is obese. IMPRESSIONS  1. LV appears mildly reduced with hypokinesis of the mid to apical inferoseptum and mid inferior segments. This could be related to pacing/postop septum. There is significant respiratory variation in septal movement (best seen on image 102) which can be  seen in pericardial constriction. Clinical correlation is recommended. Left ventricular ejection fraction, by estimation, is 40 to 45%. The left ventricle has mildly decreased function. The left ventricle demonstrates regional wall motion abnormalities (see scoring diagram/findings for description). Left ventricular diastolic function could not be evaluated.  2. Right ventricular systolic function is moderately reduced. The right ventricular size is moderately enlarged. There is normal pulmonary artery systolic pressure. The estimated right ventricular systolic pressure is 29.3 mmHg.  3. Left atrial size was severely dilated.  4. Right atrial size was severely dilated.  5. The mitral valve is degenerative. Trivial mitral valve regurgitation. No evidence of mitral stenosis. Moderate to severe mitral annular calcification.  6. The  aortic valve has been repaired/replaced. Aortic valve regurgitation is not visualized. There is a 29 mm Edwards Sapien prosthetic (TAVR) valve present in the aortic position. Procedure Date: 03/04/2017. Echo findings are consistent with normal structure and function of the aortic valve prosthesis. Aortic valve area, by VTI measures 2.83 cm. Aortic valve mean gradient measures 7.6 mmHg. Aortic valve Vmax measures 1.86 m/s.  7. The inferior vena cava is dilated in size with <50% respiratory variability, suggesting right atrial pressure of 15 mmHg. FINDINGS  Left Ventricle: LV appears mildly reduced with hypokinesis of the mid to apical inferoseptum and mid inferior segments. This could be related to pacing/postop septum. There is significant respiratory variation in septal movement (best seen on image 102)  which can be seen in pericardial constriction. Clinical correlation is recommended. Left ventricular ejection fraction, by estimation, is 40 to 45%. The left ventricle has mildly decreased function. The left ventricle demonstrates regional wall motion abnormalities. Definity contrast agent was given IV to delineate the left ventricular endocardial borders. The left ventricular internal cavity size was normal in size. There is no left ventricular hypertrophy. Abnormal (paradoxical) septal motion consistent with post-operative status. Left ventricular diastolic function could not be evaluated due to atrial fibrillation. Left ventricular diastolic function could not be evaluated.  LV Wall Scoring: The mid inferoseptal segment, apical septal segment, and mid inferior segment are hypokinetic. Right Ventricle: The right ventricular size is moderately enlarged. No increase in right ventricular wall thickness. Right ventricular systolic function is moderately reduced. There is normal pulmonary artery systolic pressure. The tricuspid regurgitant velocity is 1.89 m/s, and with an assumed right atrial pressure of 15 mmHg, the  estimated right ventricular systolic pressure is 29.3 mmHg. Left Atrium: Left atrial size was severely dilated. Right Atrium: Right atrial size was severely dilated. Pericardium: There is no evidence of pericardial effusion. Mitral  Valve: The mitral valve is degenerative in appearance. Moderate to severe mitral annular calcification. Trivial mitral valve regurgitation. No evidence of mitral valve stenosis. Tricuspid Valve: The tricuspid valve is grossly normal. Tricuspid valve regurgitation is trivial. No evidence of tricuspid stenosis. Aortic Valve: The aortic valve has been repaired/replaced. Aortic valve regurgitation is not visualized. Aortic valve mean gradient measures 7.6 mmHg. Aortic valve peak gradient measures 13.8 mmHg. Aortic valve area, by VTI measures 2.83 cm. There is a 29 mm Edwards Sapien prosthetic, stented (TAVR) valve present in the aortic position. Procedure Date: 03/04/2017. Echo findings are consistent with normal structure and function of the aortic valve prosthesis. Pulmonic Valve: The pulmonic valve was grossly normal. Pulmonic valve regurgitation is not visualized. No evidence of pulmonic stenosis. Aorta: The aortic root and ascending aorta are structurally normal, with no evidence of dilitation. Venous: The inferior vena cava is dilated in size with less than 50% respiratory variability, suggesting right atrial pressure of 15 mmHg. IAS/Shunts: The atrial septum is grossly normal. Additional Comments: A device lead is visualized in the right atrium and right ventricle.  LEFT VENTRICLE PLAX 2D LVIDd:         4.00 cm     Diastology LVIDs:         3.10 cm     LV e' medial:    5.40 cm/s LV PW:         1.20 cm     LV E/e' medial:  22.8 LV IVS:        1.10 cm     LV e' lateral:   7.65 cm/s LVOT diam:     2.70 cm     LV E/e' lateral: 16.1 LV SV:         88 LV SV Index:   39 LVOT Area:     5.73 cm  LV Volumes (MOD) LV vol d, MOD A2C: 63.9 ml LV vol d, MOD A4C: 80.5 ml LV vol s, MOD A2C: 44.3 ml  LV vol s, MOD A4C: 36.9 ml LV SV MOD A2C:     19.6 ml LV SV MOD A4C:     80.5 ml LV SV MOD BP:      33.2 ml RIGHT VENTRICLE            IVC RV Basal diam:  4.90 cm    IVC diam: 3.00 cm RV S prime:     5.22 cm/s TAPSE (M-mode): 1.8 cm LEFT ATRIUM              Index        RIGHT ATRIUM           Index LA diam:        5.40 cm  2.38 cm/m   RA Area:     31.60 cm LA Vol (A2C):   87.2 ml  38.36 ml/m  RA Volume:   113.00 ml 49.71 ml/m LA Vol (A4C):   192.0 ml 84.46 ml/m LA Biplane Vol: 131.0 ml 57.63 ml/m  AORTIC VALVE AV Area (Vmax):    2.53 cm AV Area (Vmean):   2.41 cm AV Area (VTI):     2.83 cm AV Vmax:           185.60 cm/s AV Vmean:          128.400 cm/s AV VTI:            0.311 m AV Peak Grad:      13.8 mmHg AV Mean Grad:  7.6 mmHg LVOT Vmax:         81.97 cm/s LVOT Vmean:        54.000 cm/s LVOT VTI:          0.154 m LVOT/AV VTI ratio: 0.49  AORTA Ao Root diam: 3.30 cm Ao Asc diam:  3.60 cm MITRAL VALVE                TRICUSPID VALVE MV Area (PHT): 3.88 cm     TR Peak grad:   14.3 mmHg MV Decel Time: 196 msec     TR Vmax:        189.00 cm/s MV E velocity: 123.33 cm/s                             SHUNTS                             Systemic VTI:  0.15 m                             Systemic Diam: 2.70 cm Lennie Odor MD Electronically signed by Lennie Odor MD Signature Date/Time: 07/01/2023/12:17:51 PM    Final    DG Chest 2 View  Result Date: 06/30/2023 CLINICAL DATA:  Shortness of breath. EXAM: CHEST - 2 VIEW COMPARISON:  October 27, 2020. FINDINGS: Stable cardiomegaly. Sternotomy wires are noted. Left-sided defibrillator is unchanged. Minimal bibasilar subsegmental atelectasis is noted with minimal pleural effusions. Bony thorax is unremarkable. IMPRESSION: Minimal bibasilar subsegmental atelectasis with minimal pleural effusions. Electronically Signed   By: Lupita Raider M.D.   On: 06/30/2023 16:01        Scheduled Meds:  allopurinol  100 mg Oral Daily   amiodarone  200 mg Oral Daily    aspirin  81 mg Oral Daily   atorvastatin  20 mg Oral Daily   dabigatran  150 mg Oral Q12H   [START ON 07/02/2023] dapagliflozin propanediol  10 mg Oral QAC breakfast   [START ON 07/02/2023] digoxin  125 mcg Oral QODAY   isosorbide mononitrate  30 mg Oral QHS   levothyroxine  200 mcg Oral Q0600   loratadine  10 mg Oral Daily   metoprolol succinate  100 mg Oral Daily   montelukast  10 mg Oral QHS   multivitamin with minerals  1 tablet Oral Daily   pantoprazole  40 mg Oral Daily   potassium chloride SA  20 mEq Oral BID   sertraline  50 mg Oral Daily   Continuous Infusions:   LOS: 1 day    Time spent: 40 minutes    Dorcas Carrow, MD [facility]

## 2023-07-01 NOTE — Progress Notes (Signed)
Mobility Specialist - Progress Note  Pre-mobility: 86 bpm HR,  During mobility: 84 bpm HR, Post-mobility: 85 bpm HR,   07/01/23 1124  Mobility  Activity Ambulated independently in hallway  Level of Assistance Standby assist, set-up cues, supervision of patient - no hands on  Assistive Device None  Distance Ambulated (ft) 350 ft  Range of Motion/Exercises Active  Activity Response Tolerated well  Mobility Referral Yes  $Mobility charge 1 Mobility  Mobility Specialist Start Time (ACUTE ONLY) 1115  Mobility Specialist Stop Time (ACUTE ONLY) 1124  Mobility Specialist Time Calculation (min) (ACUTE ONLY) 9 min   Pt was found on recliner chair and agreeable to ambulate. No complaints with session. At EOS returned to recliner chair with all needs met. Call bell in reach.  Billey Chang Mobility Specialist

## 2023-07-01 NOTE — Progress Notes (Signed)
Heart Failure Navigator Progress Note  Assessed for Heart & Vascular TOC clinic readiness.  Patient EF 40-45%, has a scheduled [facility] appointment with Dr. Clifton James on 07/07/2023. .  Navigator will sign off at this time.   Rhae Hammock, BSN, Scientist, clinical (histocompatibility and immunogenetics) Only

## 2023-07-01 NOTE — Consult Note (Signed)
Cardiology Consultation   Patient ID: Justin [name] MRN: [id number]; DOB: [birth year]  Admit date: 06/30/2023 Date of Consult: 07/01/2023  PCP:  Nelwyn Salisbury, MD   [facility] Providers Cardiologist:  Verne Carrow, MD  Electrophysiologist:  Sherryl Manges, MD   Patient Profile:   Justin [name] is a 89 y.o. male with a hx of HTN, HLD, severe aortic stenosis s/p TAVR, PAF, chronic diastolic and systolic CHF, CAD s/p CABG, syncope, junctional bradycardia, PVCs, NSVT and LBBB s/p ICD implantation who is being seen 07/01/2023 for the evaluation of CHF at the request of Dr. Jerral Ralph.  History of Present Illness:   Justin [name] is an 89 year old male with above medical history who is followed by Dr. Clifton James and Dr. Graciela Husbands.   Patient previously underwent CABG in 2007 with LIMA to LAD, SVG to Diagonal, SVG to OM, SVG to RCA. In 08/2016, patient underwent R/L heart catheterization that showed severe triple vessel CAD with occluded left main and occluded mid RCA, s/p 4V CABG with all 4 bypass grafts patent. Also showed moderate aortic valve stenosis. Echocardiogram in 12/2016 showed progression of aortic stenosis, EF 40-45%, no regional wall motion abnormalities, grade II DD. With reduced EF and progression of aortic stenosis to severe, he underwent TAVR on 03/04/17 with 29 mm Edwards Sapien 3 valve. After his procedure, patient developed atrial fibrillation. He was treated with IV amiodarone and converted to NSR. When seen in follow up in our clinic, it was noted that patient had irregular rhythm with IVCD. EP thought that patient had 2:1 conduction with competing fast and slow pathways. He wore a cardiac monitor that was resulted in 02/2018 and showed sinus rhythm with periods of junctional rhythm, frequent PVCs, frequent PACs, and a 45 beat run of wide-complex tachycardia (could not exclude VT). Patient was referred to EP Dr. Graciela Husbands, and underwent implantation of a BIV ICD on 05/02/2017.  Echocardiogram in 01/2018 showed EF 40-45%, grade II DD.   In 03/2021, device interrogation revealed persistent atrial fibrillation and patient underwent cardioversion. Later in 10/2021, patient had recurrence of atrial fibrillation and was cardioverted again. In 11/2021, patient had recurrent atrial flutter. He was loaded with amiodarone, underwent cardioversion in 01/2022. Had recurrence of afib in 02/2022, and he was paced out of it in the clinic.   Most recent echocardiogram from 11/12/22 showed EF 40-45%, moderate LVH, mildly reduced RV function, normal PA systolic pressure. S/p TAVR without PVL, regurgitation. There was dilation of the ascending aorta measuring 41 mm. Patient was seen in the [facility] in 11/2022, and he was felt to be euvolemic. Remained on torsemide 40 mg every other day, metolazone 2.5 mg twice weekly. Was started on entresto and farxiga. Remained on Toprol, Imdur, aldactone. He was seen by Dr. Clifton James in clinic on 01/13/23 and he was doing well at that time. Was euvolemic on exam and maintaining NSR. Underwent ICD gen change 05/30/23. Appears that since then, patient called the office with complaints of dizziness. Lasix was held. Instructed to take imdur at night. Spironolactone, farxiga, and entresto held.   Patient presented to the ED on 11/4 complaining of shortness of breath, lethargy that had been progressively worsening for 1 week. Initial vital signs in the ED showed heart rate 81 Bpm, BP 131/90, oxygen 99% on room air. Labs showed BNP 392, hsTn 16>17. K 4.0, creatinine 1.14, WBC 11.5, hemoglobin 12.5. CXR showed minimal bibasilar subsegmental atelectasis with minimal pleural effusions. EKG showed atrial  fibrillation with V-paced beats, HR 92 BPM.   On interview, patient reports that for the past month or so, he has been having progressive ankle swelling and shortness of breath. Also reports orthopnea, dry cough. Denies chest pain, palpitations. He received one  dose of IV lasix yesterday and noted good urine output. He reports some improvement in symptoms, but continues to feel short of breath and has ankle edema. Believes that he has more fluid to give. He is unsure of what his dry weight is. Patient's daughter helps with his medications, and he is not exactly sure what he is taking.   Past Medical History:  Diagnosis Date   Age-related macular degeneration, dry, both eyes    Allergic    "24/7; 365 days/year; I'm allergic to pollens, dust, all southern grasses/trees, mold, mildue, cats, dogs" (01/07/2017)   Anal fissure    Asthma    sees Dr. Kendrick Fries    Benign prostatic hypertrophy    (sees Dr. Chester Holstein   CAD (coronary artery disease)    a. s/p CABG 2007. b. Cath 08/2016 - 4/4 patent grafts.   Carotid bruit    carotid u/s 10/10: 0.39% bilaterally   Chronic combined systolic and diastolic CHF (congestive heart failure) (HCC)    Complication of anesthesia 1980s   "w/anal cyst OR, he gave me a saddle block then put a narcotic in spinal cord; had a severe reaction to that" (01/07/2017)   Congestive heart failure (CHF) [facility])    ED (erectile dysfunction)    Family history of adverse reaction to anesthesia    "daughter wakes up during OR" (01/07/2017)   GERD (gastroesophageal reflux disease)    Gout    HTN (hypertension)    Hx of colonic polyps    (sees Dr. Marina Goodell)   Hyperlipidemia    Hypothyroidism    Moderate to severe aortic stenosis    a. s/p TAVR 02/2017.   Myocardial infarction [facility]) ~ 2000   Obesity    Osteoarthritis    "was in my knees, hands" (01/07/2017 )   PAF (paroxysmal atrial fibrillation) (HCC)    a. documented post TAVR.   Precancerous skin lesion    (sees Dr. Terri Piedra)   Prostate cancer [facility]) dx'd ~ 2014   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   29 mm Edwards Sapien 3 transcatheter heart valve placed via percutaneous right transfemoral approach    Past Surgical History:   Procedure Laterality Date   ANUS SURGERY     "opened it back up cause it wouldn't heal; wound up w/a fissure" (01/07/2017)   BIV ICD INSERTION CRT-D N/A 05/02/2017   Procedure: BIV ICD INSERTION CRT-D;  Surgeon: Duke Salvia, MD;  Location: [facility] INVASIVE CV LAB;  Service: Cardiovascular;  Laterality: N/A;   CARDIAC CATHETERIZATION  10/29/2005   CARDIAC CATHETERIZATION N/A 08/27/2016   Procedure: Right/Left Heart Cath and Coronary/Graft Angiography;  Surgeon: Kathleene Hazel, MD;  Location: [facility] INVASIVE CV LAB;  Service: Cardiovascular;  Laterality: N/A;   CARDIOVERSION N/A 04/24/2021   Procedure: CARDIOVERSION;  Surgeon: Pricilla Riffle, MD;  Location: [facility] ENDOSCOPY;  Service: Cardiovascular;  Laterality: N/A;   CARDIOVERSION N/A 11/08/2021   Procedure: CARDIOVERSION;  Surgeon: Chrystie Nose, MD;  Location: [facility] ENDOSCOPY;  Service: Cardiovascular;  Laterality: N/A;   CARDIOVERSION N/A 02/06/2022   Procedure: CARDIOVERSION;  Surgeon: Wendall Stade, MD;  Location: [facility] ENDOSCOPY;  Service: Cardiovascular;  Laterality: N/A;   CATARACT EXTRACTION W/  INTRAOCULAR LENS  IMPLANT, BILATERAL Bilateral    CATARACT EXTRACTION, BILATERAL  2012   COLONOSCOPY  06/30/2008   no repeats needed    COLONOSCOPY     had 3 or 4 in the past    CORONARY ARTERY BYPASS GRAFT  2007   "CABG X4"   CYST EXCISION PERINEAL  1980s   HAMMER TOE SURGERY Bilateral    ICD GENERATOR CHANGEOUT N/A 05/30/2023   Procedure: ICD GENERATOR CHANGEOUT;  Surgeon: Duke Salvia, MD;  Location: [facility] INVASIVE CV LAB;  Service: Cardiovascular;  Laterality: N/A;   JOINT REPLACEMENT     KNEE ARTHROPLASTY  07/30/2011   Procedure: COMPUTER ASSISTED TOTAL KNEE ARTHROPLASTY;  Surgeon: Cammy Copa;  Location: [facility] OR;  Service: Orthopedics;  Laterality: Left;  left total knee arthroplasty   MASTECTOMY SUBCUTANEOUS Bilateral    MULTIPLE TOOTH EXTRACTIONS     ORIF FINGER / THUMB FRACTURE Right ~ 1980   "repair of thumb injury"   PROSTATE  BIOPSY     REPLACEMENT TOTAL KNEE BILATERAL Bilateral 2012   TEE WITHOUT CARDIOVERSION N/A 03/04/2017   Procedure: TRANSESOPHAGEAL ECHOCARDIOGRAM (TEE);  Surgeon: Kathleene Hazel, MD;  Location: [facility] OR;  Service: Open Heart Surgery;  Laterality: N/A;   TOTAL KNEE REVISION Right 05/18/2019   Procedure: RIGHT PATELLA REVISION/REMOVAL;  Surgeon: Cammy Copa, MD;  Location: [facility] OR;  Service: Orthopedics;  Laterality: Right;   TRANSCATHETER AORTIC VALVE REPLACEMENT, TRANSFEMORAL N/A 03/04/2017   Procedure: TRANSCATHETER AORTIC VALVE REPLACEMENT, TRANSFEMORAL;  Surgeon: Kathleene Hazel, MD;  Location: [facility] OR;  Service: Open Heart Surgery;  Laterality: N/A;     Home Medications:  Prior to Admission medications   Medication Sig Start Date End Date Taking? Authorizing Provider  albuterol (VENTOLIN HFA) 108 (90 Base) MCG/ACT inhaler Inhale 2 puffs into the lungs every 4 (four) hours as needed for wheezing or shortness of breath. 05/06/23  Yes Nelwyn Salisbury, MD  allopurinol (ZYLOPRIM) 100 MG tablet Take 1 tablet (100 mg total) by mouth daily. Patient taking differently: Take 100 mg by mouth daily with supper. 10/18/22  Yes Philip Aspen, Limmie Patricia, MD  amiodarone (PACERONE) 200 MG tablet Take 2 tablets by mouth twice daily x 2 weeks then decrease to 2 tablets by mouth daily x 2 weeks Patient taking differently: Take 200 mg by mouth daily. 11/13/22  Yes Duke Salvia, MD  aspirin 81 MG tablet Take 1 tablet (81 mg total) by mouth daily. 05/19/19  Yes Magnant, Charles L, PA-C  atorvastatin (LIPITOR) 20 MG tablet TAKE ONE TABLET ONCE DAILY Patient taking differently: Take 20 mg by mouth in the morning. 10/18/22  Yes Philip Aspen, Limmie Patricia, MD  benzonatate (TESSALON) 200 MG capsule Take 1 capsule (200 mg total) by mouth 2 (two) times daily as needed for cough. 05/06/23  Yes Nelwyn Salisbury, MD  cetirizine (ZYRTEC) 10 MG tablet Take 10 mg by mouth 2 (two) times daily.   Yes [provider]  colchicine 0.6 MG tablet Take 1 tablet (0.6 mg total) by mouth every 6 (six) hours as needed (gout). 10/30/21 11/25/23 Yes Nelwyn Salisbury, MD  dabigatran (PRADAXA) 150 MG CAPS capsule TAKE 1 CAPSULE BY MOUTH TWICE  DAILY Patient taking differently: Take 150 mg by mouth See admin instructions. Take 150 mg by mouth in the morning and with supper 12/18/22  Yes Nelwyn Salisbury, MD  dapagliflozin propanediol (FARXIGA) 10 MG TABS tablet Take 10 mg by mouth daily before breakfast.  Yes [provider]  digoxin (LANOXIN) 0.125 MG tablet Take 1 tablet (125 mcg total) by mouth every other day. 05/30/23 05/29/24 Yes Duke Salvia, MD  diphenhydrAMINE (BENADRYL) 25 MG tablet Take 25 mg by mouth daily as needed for allergies.   Yes [provider]  ENTRESTO 97-103 MG Take 1 tablet by mouth 2 (two) times daily.   Yes [provider]  fluorouracil (EFUDEX) 5 % cream Apply 1 application  topically 2 (two) times daily as needed (for actinic keratosis, OR AS OTHERWISE INSTRUCTED).   Yes [provider]  furosemide (LASIX) 80 MG tablet Take 80 mg by mouth in the morning.   Yes [provider]  isosorbide mononitrate (IMDUR) 30 MG 24 hr tablet Take 1 tablet (30 mg total) by mouth at bedtime. Patient taking differently: Take 30 mg by mouth daily with supper. 06/10/23  Yes Duke Salvia, MD  ketotifen (ZADITOR) 0.025 % ophthalmic solution Place 1 drop into both eyes daily as needed (for itching).   Yes [provider]  levothyroxine (SYNTHROID) 200 MCG tablet Take 1 tablet (200 mcg total) by mouth daily. Patient taking differently: Take 200 mcg by mouth daily before breakfast. 12/25/22  Yes Nelwyn Salisbury, MD  methocarbamol (ROBAXIN) 500 MG tablet Take 1 tablet (500 mg total) by mouth 4 (four) times daily. Patient taking differently: Take 500 mg by mouth 4 (four) times daily as needed for muscle spasms. 09/13/22  Yes London Sheer, MD  metoCLOPramide  (REGLAN) 10 MG tablet TAKE ONE TABLET BY MOUTH EVERY MORNING WITH BREAKFAST Patient taking differently: Take 10 mg by mouth daily with breakfast. 04/14/23  Yes Nelwyn Salisbury, MD  metolazone (ZAROXOLYN) 2.5 MG tablet Take 1 tablet (2.5 mg total) by mouth once a week. Take 1 tablet every Saturday Patient taking differently: Take 2.5 mg by mouth every Saturday. 02/18/23  Yes Graciella Freer, PA-C  metoprolol succinate (TOPROL-XL) 50 MG 24 hr tablet Take 2 tablets (100 mg total) by mouth daily. Take with or immediately following a meal. Patient taking differently: Take 50 mg by mouth 2 (two) times daily. Take with or immediately following a meal. 05/30/23  Yes Duke Salvia, MD  montelukast (SINGULAIR) 10 MG tablet TAKE ONE TABLET BY MOUTH ONCE DAILY IN THE EVENING Patient taking differently: Take 10 mg by mouth daily with supper. 08/20/22  Yes Hunsucker, Lesia Sago, MD  Multiple Minerals-Vitamins (CAL MAG ZINC +D3 PO) Take 2 tablets by mouth in the morning. 500 mg / 7 mg / 25 mcg   Yes [provider]  Multiple Vitamins-Minerals (ICAPS AREDS FORMULA PO) Take 1 capsule by mouth 2 (two) times daily.   Yes [provider]  Multiple Vitamins-Minerals (MULTI-VITAMIN GUMMIES) CHEW Chew 2 tablets by mouth See admin instructions. Chew 2 gummies by mouth in the morning   Yes [provider]  omeprazole (PRILOSEC) 20 MG capsule TAKE ONE CAPSULE TWICE DAILY Patient taking differently: Take 20 mg by mouth 2 (two) times daily. 03/17/23  Yes Nelwyn Salisbury, MD  potassium chloride SA (KLOR-CON M) 20 MEQ tablet Take 1 tablet (20 mEq total) by mouth 2 (two) times daily. 02/18/23  Yes Graciella Freer, PA-C  sertraline (ZOLOFT) 50 MG tablet Take 1 tablet (50 mg total) by mouth daily. Patient taking differently: Take 50 mg by mouth daily with supper. 04/02/23  Yes Nelwyn Salisbury, MD  silver sulfADIAZINE (SILVADENE) 1 % cream Apply 1 Application topically 2 (two) times daily. Patient  taking differently: Apply 1 Application topically See admin instructions. Apply to affected areas 2 times a day for wound care 05/14/23  Yes Nelwyn Salisbury, MD  spironolactone (ALDACTONE) 25 MG tablet Take 12.5 mg by mouth daily with supper.   Yes [provider]  torsemide (DEMADEX) 20 MG tablet Take 2 tablets (40 mg total) every other day, alternating with 1 tablet (20 mg total) every other day. Patient taking differently: Take 20-40 mg by mouth See admin instructions. Take 20 mg by mouth in the morning and with supper ALTERNATING WITH 20 mg only in the morning on the other days 02/18/23  Yes Tillery, Mariam Dollar, PA-C  triamcinolone cream (KENALOG) 0.1 % Apply 1 application  topically daily as needed for dry skin (affected areas). 04/15/17  Yes [provider]  valACYclovir (VALTREX) 500 MG tablet Take 500 mg by mouth 2 (two) times daily as needed (cold sores).   Yes [provider]    Inpatient Medications: Scheduled Meds:  allopurinol  100 mg Oral Daily   amiodarone  200 mg Oral Daily   aspirin  81 mg Oral Daily   atorvastatin  20 mg Oral Daily   dabigatran  150 mg Oral Q12H   [START ON 07/02/2023] dapagliflozin propanediol  10 mg Oral QAC breakfast   [START ON 07/02/2023] digoxin  125 mcg Oral QODAY   furosemide  60 mg Intravenous Once   isosorbide mononitrate  30 mg Oral QHS   levothyroxine  200 mcg Oral Q0600   loratadine  10 mg Oral Daily   metoprolol succinate  100 mg Oral Daily   montelukast  10 mg Oral QHS   multivitamin with minerals  1 tablet Oral Daily   pantoprazole  40 mg Oral Daily   potassium chloride SA  20 mEq Oral BID   sertraline  50 mg Oral Daily   Continuous Infusions:  PRN Meds: acetaminophen **OR** acetaminophen, albuterol, benzonatate, diphenhydrAMINE, fluorouracil, ketotifen, methocarbamol, ondansetron **OR** ondansetron (ZOFRAN) IV, perflutren lipid microspheres (DEFINITY) IV suspension, senna-docusate  Allergies:    Allergies   Allergen Reactions   Peanut-Containing Drug Products Anaphylaxis   Sulfonamide Derivatives Anaphylaxis   Amlodipine Swelling and Other (See Comments)    Swelling in ankles   Eliquis [Apixaban] Other (See Comments)    Back/hip pain   Lisinopril Cough   Xarelto [Rivaroxaban] Other (See Comments)    Back/hip pain    Social History:   Social History   Socioeconomic History   Marital status: Married    Spouse name: Not on file   Number of children: 3   Years of education: Not on file   Highest education level: Not on file  Occupational History   Occupation: Product/process development scientist    Comment: builds malls  Tobacco Use   Smoking status: Former    Current packs/day: 0.00    Average packs/day: 3.5 packs/day for 13.0 years (45.5 ttl pk-yrs)    Types: Cigarettes    Start date: 24    Quit date: 1963    Years since quitting: 61.8   Smokeless tobacco: Never   Tobacco comments:    Former smoker 04/19/21  Vaping Use   Vaping status: Never Used  Substance and Sexual Activity   Alcohol use: Not Currently    Alcohol/week: 7.0 standard drinks of alcohol    Types: 7 Cans of beer per week    Comment: 1 beer daily after 5pm (04/19/21)   Drug use: No   Sexual activity: Never  Other Topics  Concern   Not on file  Social History Narrative   FH of CAD, Male 1st degree relative less than age 57.   Social Determinants of Health   Financial Resource Strain: Low Risk  (03/31/2023)   Overall Financial Resource Strain (CARDIA)    Difficulty of Paying Living Expenses: Not very hard  Food Insecurity: No Food Insecurity (06/30/2023)   Hunger Vital Sign    Worried About Running Out of Food in the Last Year: Never true    Ran Out of Food in the Last Year: Never true  Transportation Needs: No Transportation Needs (06/30/2023)   PRAPARE - Administrator, Civil Service (Medical): No    Lack of Transportation (Non-Medical): No  Physical Activity: Sufficiently Active (03/31/2023)   Exercise  Vital Sign    Days of Exercise per Week: 7 days    Minutes of Exercise per Session: 30 min  Stress: No Stress Concern Present (03/31/2023)   [employer] of Occupational Health - Occupational Stress Questionnaire    Feeling of Stress : Not at all  Social Connections: Moderately Integrated (03/31/2023)   Social Connection and Isolation Panel [NHANES]    Frequency of Communication with Friends and Family: Three times a week    Frequency of Social Gatherings with Friends and Family: Three times a week    Attends Religious Services: Never    Active Member of Clubs or Organizations: Yes    Attends Banker Meetings: Never    Marital Status: Married  Catering manager Violence: Not At Risk (06/30/2023)   Humiliation, Afraid, Rape, and Kick questionnaire    Fear of Current or Ex-Partner: No    Emotionally Abused: No    Physically Abused: No    Sexually Abused: No    Family History:    Family History  Problem Relation Age of Onset   Heart attack Father 9   Allergic rhinitis Father    Asthma Father    Heart failure Mother 45   Uterine cancer Mother    Breast cancer Mother    Colon cancer Neg Hx    Esophageal cancer Neg Hx      ROS:  Please see the history of present illness.   All other ROS reviewed and negative.     Physical Exam/Data:   Vitals:   06/30/23 2216 07/01/23 0157 07/01/23 0526 07/01/23 0531  BP: 120/72 (!) 140/85 129/72   Pulse: 98 (!) 103 98   Resp: 18 16 20    Temp: 97.7 F (36.5 C) 97.9 F (36.6 C) 97.7 F (36.5 C)   TempSrc: Oral Oral Oral   SpO2: 97% 94% 96%   Weight:    122.7 kg    Intake/Output Summary (Last 24 hours) at 07/01/2023 1020 Last data filed at 07/01/2023 0819 Gross per 24 hour  Intake 446.43 ml  Output --  Net 446.43 ml      07/01/2023    5:31 AM 06/30/2023    6:08 PM 05/30/2023    6:00 AM  Last 3 Weights  Weight (lbs) 270 lb 6.4 oz 266 lb 1.5 oz 259 lb  Weight (kg) 122.653 kg 120.7 kg 117.482 kg     Body mass index  is 43.64 kg/m.  General:  Well nourished, well developed, in no acute distress. Sitting upright in the bed  HEENT: normal Neck: no JVD Vascular: Radial pulses 2+ bilaterally Cardiac:  normal S1, S2; RRR; no murmur  Lungs:  crackles in bilateral lung bases. Normal work of breathing  on room air  Abd: soft, nontender, mildly distended Ext: 1+ edema in BLE  Musculoskeletal:  No deformities, BUE and BLE strength normal and equal Skin: warm and dry  Neuro:  CNs 2-12 intact, no focal abnormalities noted Psych:  Normal affect   EKG:  The EKG was personally reviewed and demonstrates:  Atrial fibrillation with V-paced beats, HR 92 BPM  Telemetry:  Telemetry was personally reviewed and demonstrates:  Atrial fibrillation, V-pacing, occasional PVCs  Relevant CV Studies: Cardiac Studies & Procedures       ECHOCARDIOGRAM  ECHOCARDIOGRAM COMPLETE 11/12/2022  Narrative ECHOCARDIOGRAM REPORT    Patient Name:   Justin [name] Date of Exam: 11/12/2022 Medical Rec #:  [id number]        Height:       66.0 in Accession #:    [id number]       Weight:       278.8 lb Date of Birth:  24-Apr-1934       BSA:          2.303 m Patient Age:    88 years         BP:           82/56 mmHg Patient Gender: M                HR:           53 bpm. Exam Location:  [address]  Procedure: 2D Echo, Cardiac Doppler and Color Doppler  Indications:    I50.9 CHF  History:        Patient has prior history of Echocardiogram examinations, most recent 09/11/2021. CHF, CAD and Previous Myocardial Infarction, Defibrillator, Prior CABG and Abnormal ECG, Arrythmias:Atrial Fibrillation, Signs/Symptoms:Shortness of Breath; Risk Factors:Family History of Coronary Artery Disease, Hypertension, Dyslipidemia and Former Smoker. Obesity, Status Post TAVR (2018, 29mm Edwards S3), CHF (prior EF 40-45%).  Sonographer:    Farrel Conners RDCS Referring Phys: Graciella Freer   Sonographer Comments: Patient Opted out of  using Definity due to Arrythmia. Appointment with Dr. Graciela Husbands tomorrow, will evaluate A-Fib and return for Definity if needed when better controlled. IMPRESSIONS   1. Windows are challenging. Left ventricular ejection fraction, by estimation, is 40 to 45%. The left ventricle has mildly decreased function. The left ventricle demonstrates global hypokinesis. There is moderate concentric left ventricular hypertrophy. Left ventricular diastolic parameters are indeterminate. 2. Right ventricular systolic function is mildly reduced. The right ventricular size is mildly enlarged. There is normal pulmonary artery systolic pressure. 3. No evidence of mitral valve regurgitation. 4. S/p TAVR 29 mm Edwards S3. No significant PVL. AV max 1.5 m/s. Normal prosthesis. Aortic valve regurgitation is not visualized. Aortic valve area, by VTI measures 2.88 cm. 5. Aneurysm of the ascending aorta, measuring 41 mm. 6. The inferior vena cava is normal in size with greater than 50% respiratory variability, suggesting right atrial pressure of 3 mmHg.  Comparison(s): No significant change from prior study.  FINDINGS Left Ventricle: Windows are challenging. Left ventricular ejection fraction, by estimation, is 40 to 45%. The left ventricle has mildly decreased function. The left ventricle demonstrates global hypokinesis. The left ventricular internal cavity size was normal in size. There is moderate concentric left ventricular hypertrophy. Left ventricular diastolic parameters are indeterminate.  Right Ventricle: The right ventricular size is mildly enlarged. Right ventricular systolic function is mildly reduced. There is normal pulmonary artery systolic pressure. The tricuspid regurgitant velocity is 2.84 m/s, and with an assumed right atrial pressure of 3 mmHg,  the estimated right ventricular systolic pressure is 35.3 mmHg.  Left Atrium: Left atrial size was normal in size.  Right Atrium: Right atrial size was normal in  size.  Pericardium: There is no evidence of pericardial effusion.  Mitral Valve: No evidence of mitral valve regurgitation.  Tricuspid Valve: Tricuspid valve regurgitation is mild.  Aortic Valve: S/p TAVR 29 mm Edwards S3. No significant PVL. AV max 1.5 m/s. Normal prosthesis. Aortic valve regurgitation is not visualized. Aortic valve mean gradient measures 5.0 mmHg. Aortic valve peak gradient measures 9.5 mmHg. Aortic valve area, by VTI measures 2.88 cm.  Pulmonic Valve: Pulmonic valve regurgitation is mild.  Aorta: There is an aneurysm involving the ascending aorta measuring 41 mm.  Venous: The inferior vena cava is normal in size with greater than 50% respiratory variability, suggesting right atrial pressure of 3 mmHg.  IAS/Shunts: No atrial level shunt detected by color flow Doppler.  Additional Comments: A device lead is visualized.   LEFT VENTRICLE PLAX 2D LVIDd:         3.40 cm   Diastology LVIDs:         2.60 cm   LV e' medial:    9.68 cm/s LV PW:         1.50 cm   LV E/e' medial:  13.6 LV IVS:        1.40 cm   LV e' lateral:   11.70 cm/s LVOT diam:     2.50 cm   LV E/e' lateral: 11.2 LV SV:         89 LV SV Index:   39 LVOT Area:     4.91 cm   RIGHT VENTRICLE RV Basal diam:  4.40 cm RV Mid diam:    4.40 cm RV S prime:     7.40 cm/s TAPSE (M-mode): 0.9 cm RVSP:           35.3 mmHg  LEFT ATRIUM             Index        RIGHT ATRIUM           Index LA diam:        5.40 cm 2.34 cm/m   RA Pressure: 3.00 mmHg LA Vol (A2C):   93.7 ml 40.69 ml/m  RA Area:     24.30 cm LA Vol (A4C):   75.5 ml 32.78 ml/m  RA Volume:   72.10 ml  31.31 ml/m LA Biplane Vol: 87.4 ml 37.95 ml/m AORTIC VALVE AV Area (Vmax):    3.00 cm AV Area (Vmean):   2.92 cm AV Area (VTI):     2.88 cm AV Vmax:           154.50 cm/s AV Vmean:          107.500 cm/s AV VTI:            0.311 m AV Peak Grad:      9.5 mmHg AV Mean Grad:      5.0 mmHg LVOT Vmax:         94.38 cm/s LVOT Vmean:         63.860 cm/s LVOT VTI:          0.182 m LVOT/AV VTI ratio: 0.59  AORTA Ao Root diam: 3.50 cm Ao Asc diam:  4.10 cm  MITRAL VALVE                TRICUSPID VALVE MV Area (PHT): cm  TR Peak grad:   32.3 mmHg MV Decel Time: 236 msec     TR Vmax:        284.00 cm/s MV E velocity: 131.50 cm/s  Estimated RAP:  3.00 mmHg RVSP:           35.3 mmHg  SHUNTS Systemic VTI:  0.18 m Systemic Diam: 2.50 cm  Carolan Clines Electronically signed by Carolan Clines Signature Date/Time: 11/12/2022/10:45:45 AM    Final              Laboratory Data:  High Sensitivity Troponin:   Recent Labs  Lab 06/30/23 1321 06/30/23 1616  TROPONINIHS 16 17     Chemistry Recent Labs  Lab 06/30/23 1321 06/30/23 1616 07/01/23 0355  NA 140  --  140  K 4.0  --  4.1  CL 107  --  106  CO2 24  --  24  GLUCOSE 133*  --  125*  BUN 14  --  14  CREATININE 1.14  --  1.30*  CALCIUM 8.2*  --  7.8*  MG  --  2.7*  --   GFRNONAA >60  --  53*  ANIONGAP 9  --  10    Recent Labs  Lab 06/30/23 1321  PROT 7.0  ALBUMIN 2.7*  AST 30  ALT 26  ALKPHOS 132*  BILITOT 1.1   Lipids No results for input(s): "CHOL", "TRIG", "HDL", "LABVLDL", "LDLCALC", "CHOLHDL" in the last 168 hours.  Hematology Recent Labs  Lab 06/30/23 1321 07/01/23 0355  WBC 11.5* 11.9*  RBC 4.75 4.22  HGB 12.5* 11.2*  HCT 41.6 36.9*  MCV 87.6 87.4  MCH 26.3 26.5  MCHC 30.0 30.4  RDW 17.5* 17.5*  PLT 313 283   Thyroid  Recent Labs  Lab 06/30/23 1637  TSH 9.303*  FREET4 1.21*    BNP Recent Labs  Lab 06/30/23 1321  BNP 392.9*    DDimer No results for input(s): "DDIMER" in the last 168 hours.   Radiology/Studies:  DG Chest 2 View  Result Date: 06/30/2023 CLINICAL DATA:  Shortness of breath. EXAM: CHEST - 2 VIEW COMPARISON:  October 27, 2020. FINDINGS: Stable cardiomegaly. Sternotomy wires are noted. Left-sided defibrillator is unchanged. Minimal bibasilar subsegmental atelectasis is noted with minimal pleural  effusions. Bony thorax is unremarkable. IMPRESSION: Minimal bibasilar subsegmental atelectasis with minimal pleural effusions. Electronically Signed   By: Lupita Raider M.D.   On: 06/30/2023 16:01     Assessment and Plan:   Acute on Chronic HFmrEF  - Most recent echocardiogram from 10/2022 showed EF 40-45%, moderate LVH, mildly reduced RV function, normal PA systolic pressure - Patient's daughter recently called Dr. Graciela Husbands concerned that patient was experiencing dizziness, low BP. Entresto, spironolactone, farxiga, lasix were held. Remained on imdur, metolazone twice weekly  - Now, patient presented with shortness of breath, lethargy. BNP 392. CXR with minimal pleural effusions  - Was given IV lasix 60 mg yesterday- I/Os not recorded. Creatinine 1.14>1.30 today. Baseline creatinine around 1.3-1.6  - Continues to have ankle edema, crackles in lung bases. Ordered IV lasix 60 mg once today  - Strict I/Os, daily weights. BMP in AM to assess kidney function and electrolytes on lasix  - Continue imdur 30 mg every evening, metoprolol succinate 100 mg every AM.  - Resume farxiga  - With recent complaints of orthostatic hypotension, hold off on entresto or spironolactone for now.  - Echocardiogram this admission pending   Persistent Afib/flutter  Frequent PVCs  S/p ICD  -  Patient underwent gen change on 10/4. Has been in persistent afib since that time  - On amiodarone 200 mg daily, metoprolol succinate 100 mg daily, digoxin 125 mcg every other day  - Per telemetry, patient remains in rate controlled atrial fibrillation with V-pacing, HR in the 80s-90s. Occasional PVCs present. - Patient asymptomatic in afib. HR well controlled. Continue current regiment for now. Note, digoxin level <0.4 on presentation, not sure how much benefit patient is getting from digoxin as HR well controlled with very low serum digoxin levels. Will discuss with MD increasing digoxin dose vs stopping digoxin  - Continue pradaxa    HTN  - BP well controlled  CAD s/p CABG  - Patient denies chest pain. hsTn neg x2  - Continue ASA 81 mg daily, lipitor 20 mg daily, metoprolol succinate 100 mg daily, imdur 30 mg daily   HLD  - Lipid panel from 09/2022 showed LDL 72 - Continue lipitor 20 mg daily   Otherwise per primary  - Anemia  - Asthma, allergies - Hypothyroidism  - GERD  - Gout    Risk Assessment/Risk Scores:    New York Heart Association (NYHA) Functional Class NYHA Class IV  CHA2DS2-VASc Score = 5   This indicates a 7.2% annual risk of stroke. The patient's score is based upon: CHF History: 1 HTN History: 1 Diabetes History: 0 Stroke History: 0 Vascular Disease History: 1 Age Score: 2 Gender Score: 0 For questions or updates, please contact [facility] Please consult [email or web address] for contact info under    Signed, Jonita Albee, PA-C  07/01/2023 10:20 AM

## 2023-07-02 DIAGNOSIS — I5043 Acute on chronic combined systolic (congestive) and diastolic (congestive) heart failure: Secondary | ICD-10-CM | POA: Diagnosis not present

## 2023-07-02 LAB — BASIC METABOLIC PANEL
Anion gap: 8 (ref 5–15)
BUN: 16 mg/dL (ref 8–23)
CO2: 27 mmol/L (ref 22–32)
Calcium: 7.9 mg/dL — ABNORMAL LOW (ref 8.9–10.3)
Chloride: 101 mmol/L (ref 98–111)
Creatinine, Ser: 1.49 mg/dL — ABNORMAL HIGH (ref 0.61–1.24)
GFR, Estimated: 45 mL/min — ABNORMAL LOW (ref >60)
Glucose, Bld: 108 mg/dL — ABNORMAL HIGH (ref 70–99)
Potassium: 3.6 mmol/L (ref 3.5–5.1)
Sodium: 136 mmol/L (ref 135–145)

## 2023-07-02 MED ORDER — FUROSEMIDE 10 MG/ML IJ SOLN
60.0000 mg | Freq: Two times a day (BID) | INTRAMUSCULAR | Status: DC
  Administered 2023-07-02 – 2023-07-05 (×8): 60 mg via INTRAVENOUS
  Filled 2023-07-02 (×8): qty 6

## 2023-07-02 NOTE — Progress Notes (Signed)
PROGRESS NOTE    Justin [name]  ZOX:[id number] DOB: 1933-10-31 DOA: 06/30/2023 PCP: Nelwyn Salisbury, MD    Brief Narrative:  89 year old with history of VT, sick sinus syndrome status post recent pacemaker placement, A-fib a flutter, aortic stenosis status post TAVR, CAD status post CABG, obesity, hypothyroidism and hypertension, chronic combined heart failure presented to the ER for progressive shortness of breath since he had pacemaker placed on October 4.  He has been more fatigued and dyspneic on exertion.  Also had leg swelling.  In the emergency room hemodynamically stable.  On room air.  Creatinine 1.14.  BNP 392.  Troponin 16.  Chest x-ray with bibasilar subsegmental atelectasis with minimal pleural effusion.  Patient was admitted for CHF exacerbation.  Subjective:  Patient seen and examined.  He does feel overall better.  Urine output is not measured.  Creatinine is 1.49.  He is going to collect his urine now on.  Denies any chest pain.  He did mobilize in the hallway.  Still has edema on his legs.  Assessment & Plan:   Acute on chronic, right heart failure: Previous history of type I diastolic dysfunction.  Echocardiogram today shows ejection fraction 45 to 50%. Acute on chronic congestive heart failure: Presented with orthopnea, leg edema, cardiomegaly on x-ray.  Increasing shortness of breath. Patient is on IV diuretics and responding with Urine output not exactly measured however patient clinically improving.  Redosing IV Lasix today.  Followed by cardiology. Patient is on digoxin, Imdur, metoprolol XL 100 mg.  Followed by cardiology.  Restricted GDMT.  Follow-up renal function test.  Slight worsening creatinine today.  Chronic medical issues including Persistent A-fib flutter, frequent PVCs, sick sinus syndrome status post pacemaker: Currently paced rhythm.  Stable. On amiodarone, Pradaxa, digoxin.  Electrolytes are adequate.  Keep on potassium replacement. Essential  hypertension, stable on nitrates and metoprolol. Hyperlipidemia, on atorvastatin 20 Coronary artery disease status post CABG, on aspirin as well as Pradaxa. Hypothyroidism, on levothyroxine 200 GERD, on Protonix Gout, on allopurinol     DVT prophylaxis:  dabigatran (PRADAXA) capsule 150 mg   Code Status: DNR Family Communication: None at the bedside Disposition Plan: Status is: Inpatient Remains inpatient appropriate because: Significant fluid overload, on IV diuresis     Consultants:  Cardiology  Procedures:  Echocardiogram  Antimicrobials:  None     Objective: Vitals:   07/01/23 1922 07/01/23 2126 07/02/23 0427 07/02/23 0900  BP: 118/63 134/80 (!) 103/51   Pulse: 67 69 74   Resp: 20  20   Temp: 97.6 F (36.4 C)  97.9 F (36.6 C)   TempSrc: Oral  Oral   SpO2: 98%  95%   Weight:    123.4 kg    Intake/Output Summary (Last 24 hours) at 07/02/2023 1106 Last data filed at 07/02/2023 1000 Gross per 24 hour  Intake 480 ml  Output 300 ml  Net 180 ml   Filed Weights   06/30/23 1808 07/01/23 0531 07/02/23 0900  Weight: 120.7 kg 122.7 kg 123.4 kg    Examination:  General exam: Appears calm and comfortable sitting in chair. Respiratory system: Clear to auscultation. Respiratory effort normal.  Difficult to appreciate due to obesity however no additional sounds. Cardiovascular system: S1 & S2 heard, RRR.  Unable to appreciate any JVD.  No murmurs, rubs, gallops or clicks.  1+ pedal edema bilateral.  Gastrointestinal system: Abdomen is nondistended, soft and nontender. No organomegaly or masses felt. Normal bowel sounds heard.  Obese and pendulous. Central  nervous system: Alert and oriented. No focal neurological deficits.   Data Reviewed: I have personally reviewed following labs and imaging studies  CBC: Recent Labs  Lab 06/30/23 1321 07/01/23 0355  WBC 11.5* 11.9*  NEUTROABS 8.9*  --   HGB 12.5* 11.2*  HCT 41.6 36.9*  MCV 87.6 87.4  PLT 313 283    Basic Metabolic Panel: Recent Labs  Lab 06/30/23 1321 06/30/23 1616 07/01/23 0355 07/02/23 0415  NA 140  --  140 136  K 4.0  --  4.1 3.6  CL 107  --  106 101  CO2 24  --  24 27  GLUCOSE 133*  --  125* 108*  BUN 14  --  14 16  CREATININE 1.14  --  1.30* 1.49*  CALCIUM 8.2*  --  7.8* 7.9*  MG  --  2.7*  --   --   PHOS  --  2.3* 3.3  --    GFR: Estimated Creatinine Clearance: 43.3 mL/min (A) (by C-G formula based on SCr of 1.49 mg/dL (H)). Liver Function Tests: Recent Labs  Lab 06/30/23 1321  AST 30  ALT 26  ALKPHOS 132*  BILITOT 1.1  PROT 7.0  ALBUMIN 2.7*   No results for input(s): "LIPASE", "AMYLASE" in the last 168 hours. No results for input(s): "AMMONIA" in the last 168 hours. Coagulation Profile: No results for input(s): "INR", "PROTIME" in the last 168 hours. Cardiac Enzymes: No results for input(s): "CKTOTAL", "CKMB", "CKMBINDEX", "TROPONINI" in the last 168 hours. BNP (last 3 results) Recent Labs    10/16/22 1259  PROBNP 2,293*   HbA1C: No results for input(s): "HGBA1C" in the last 72 hours. CBG: Recent Labs  Lab 07/01/23 0730 07/01/23 1113 07/01/23 1615  GLUCAP 124* 109* 124*   Lipid Profile: No results for input(s): "CHOL", "HDL", "LDLCALC", "TRIG", "CHOLHDL", "LDLDIRECT" in the last 72 hours. Thyroid Function Tests: Recent Labs    06/30/23 1637  TSH 9.303*  FREET4 1.21*   Anemia Panel: Recent Labs    06/30/23 1637  FERRITIN 28  TIBC 400  IRON 151   Sepsis Labs: No results for input(s): "PROCALCITON", "LATICACIDVEN" in the last 168 hours.  No results found for this or any previous visit (from the past 240 hour(s)).       Radiology Studies: ECHOCARDIOGRAM COMPLETE  Result Date: 07/01/2023    ECHOCARDIOGRAM REPORT   Patient Name:   Justin [name] Date of Exam: 07/01/2023 Medical Rec #:  [id number]        Height:       66.0 in Accession #:    [id number]       Weight:       270.4 lb Date of Birth:  05-14-1934       BSA:           2.273 m Patient Age:    89 years         BP:           129/72 mmHg Patient Gender: M                HR:           85 bpm. Exam Location:  Inpatient Procedure: 2D Echo, Cardiac Doppler, Color Doppler and Intracardiac            Opacification Agent Indications:    Dyspnea  History:        Patient has prior history of Echocardiogram examinations, most  recent 11/12/2022. CHF, CAD, Prior CABG, Prior Cardiac Surgery,                 Pacemaker and Defibrillator, Arrythmias:Atrial Fibrillation;                 Risk Factors:Former Smoker.                 Aortic Valve: 29 mm Edwards Sapien prosthetic, stented (TAVR)                 valve is present in the aortic position. Procedure Date:                 03/04/2017.  Sonographer:    Karma Ganja Referring Phys: [id number] PROSPER M AMPONSAH  Sonographer Comments: Technically difficult study due to poor echo windows and patient is obese. IMPRESSIONS  1. LV appears mildly reduced with hypokinesis of the mid to apical inferoseptum and mid inferior segments. This could be related to pacing/postop septum. There is significant respiratory variation in septal movement (best seen on image 102) which can be  seen in pericardial constriction. Clinical correlation is recommended. Left ventricular ejection fraction, by estimation, is 40 to 45%. The left ventricle has mildly decreased function. The left ventricle demonstrates regional wall motion abnormalities (see scoring diagram/findings for description). Left ventricular diastolic function could not be evaluated.  2. Right ventricular systolic function is moderately reduced. The right ventricular size is moderately enlarged. There is normal pulmonary artery systolic pressure. The estimated right ventricular systolic pressure is 29.3 mmHg.  3. Left atrial size was severely dilated.  4. Right atrial size was severely dilated.  5. The mitral valve is degenerative. Trivial mitral valve regurgitation. No evidence of mitral  stenosis. Moderate to severe mitral annular calcification.  6. The aortic valve has been repaired/replaced. Aortic valve regurgitation is not visualized. There is a 29 mm Edwards Sapien prosthetic (TAVR) valve present in the aortic position. Procedure Date: 03/04/2017. Echo findings are consistent with normal structure and function of the aortic valve prosthesis. Aortic valve area, by VTI measures 2.83 cm. Aortic valve mean gradient measures 7.6 mmHg. Aortic valve Vmax measures 1.86 m/s.  7. The inferior vena cava is dilated in size with <50% respiratory variability, suggesting right atrial pressure of 15 mmHg. FINDINGS  Left Ventricle: LV appears mildly reduced with hypokinesis of the mid to apical inferoseptum and mid inferior segments. This could be related to pacing/postop septum. There is significant respiratory variation in septal movement (best seen on image 102)  which can be seen in pericardial constriction. Clinical correlation is recommended. Left ventricular ejection fraction, by estimation, is 40 to 45%. The left ventricle has mildly decreased function. The left ventricle demonstrates regional wall motion abnormalities. Definity contrast agent was given IV to delineate the left ventricular endocardial borders. The left ventricular internal cavity size was normal in size. There is no left ventricular hypertrophy. Abnormal (paradoxical) septal motion consistent with post-operative status. Left ventricular diastolic function could not be evaluated due to atrial fibrillation. Left ventricular diastolic function could not be evaluated.  LV Wall Scoring: The mid inferoseptal segment, apical septal segment, and mid inferior segment are hypokinetic. Right Ventricle: The right ventricular size is moderately enlarged. No increase in right ventricular wall thickness. Right ventricular systolic function is moderately reduced. There is normal pulmonary artery systolic pressure. The tricuspid regurgitant velocity is  1.89 m/s, and with an assumed right atrial pressure of 15 mmHg, the estimated right ventricular systolic pressure is 29.3 mmHg.  Left Atrium: Left atrial size was severely dilated. Right Atrium: Right atrial size was severely dilated. Pericardium: There is no evidence of pericardial effusion. Mitral Valve: The mitral valve is degenerative in appearance. Moderate to severe mitral annular calcification. Trivial mitral valve regurgitation. No evidence of mitral valve stenosis. Tricuspid Valve: The tricuspid valve is grossly normal. Tricuspid valve regurgitation is trivial. No evidence of tricuspid stenosis. Aortic Valve: The aortic valve has been repaired/replaced. Aortic valve regurgitation is not visualized. Aortic valve mean gradient measures 7.6 mmHg. Aortic valve peak gradient measures 13.8 mmHg. Aortic valve area, by VTI measures 2.83 cm. There is a 29 mm Edwards Sapien prosthetic, stented (TAVR) valve present in the aortic position. Procedure Date: 03/04/2017. Echo findings are consistent with normal structure and function of the aortic valve prosthesis. Pulmonic Valve: The pulmonic valve was grossly normal. Pulmonic valve regurgitation is not visualized. No evidence of pulmonic stenosis. Aorta: The aortic root and ascending aorta are structurally normal, with no evidence of dilitation. Venous: The inferior vena cava is dilated in size with less than 50% respiratory variability, suggesting right atrial pressure of 15 mmHg. IAS/Shunts: The atrial septum is grossly normal. Additional Comments: A device lead is visualized in the right atrium and right ventricle.  LEFT VENTRICLE PLAX 2D LVIDd:         4.00 cm     Diastology LVIDs:         3.10 cm     LV e' medial:    5.40 cm/s LV PW:         1.20 cm     LV E/e' medial:  22.8 LV IVS:        1.10 cm     LV e' lateral:   7.65 cm/s LVOT diam:     2.70 cm     LV E/e' lateral: 16.1 LV SV:         88 LV SV Index:   39 LVOT Area:     5.73 cm  LV Volumes (MOD) LV vol d, MOD  A2C: 63.9 ml LV vol d, MOD A4C: 80.5 ml LV vol s, MOD A2C: 44.3 ml LV vol s, MOD A4C: 36.9 ml LV SV MOD A2C:     19.6 ml LV SV MOD A4C:     80.5 ml LV SV MOD BP:      33.2 ml RIGHT VENTRICLE            IVC RV Basal diam:  4.90 cm    IVC diam: 3.00 cm RV S prime:     5.22 cm/s TAPSE (M-mode): 1.8 cm LEFT ATRIUM              Index        RIGHT ATRIUM           Index LA diam:        5.40 cm  2.38 cm/m   RA Area:     31.60 cm LA Vol (A2C):   87.2 ml  38.36 ml/m  RA Volume:   113.00 ml 49.71 ml/m LA Vol (A4C):   192.0 ml 84.46 ml/m LA Biplane Vol: 131.0 ml 57.63 ml/m  AORTIC VALVE AV Area (Vmax):    2.53 cm AV Area (Vmean):   2.41 cm AV Area (VTI):     2.83 cm AV Vmax:           185.60 cm/s AV Vmean:          128.400 cm/s AV VTI:  0.311 m AV Peak Grad:      13.8 mmHg AV Mean Grad:      7.6 mmHg LVOT Vmax:         81.97 cm/s LVOT Vmean:        54.000 cm/s LVOT VTI:          0.154 m LVOT/AV VTI ratio: 0.49  AORTA Ao Root diam: 3.30 cm Ao Asc diam:  3.60 cm MITRAL VALVE                TRICUSPID VALVE MV Area (PHT): 3.88 cm     TR Peak grad:   14.3 mmHg MV Decel Time: 196 msec     TR Vmax:        189.00 cm/s MV E velocity: 123.33 cm/s                             SHUNTS                             Systemic VTI:  0.15 m                             Systemic Diam: 2.70 cm Lennie Odor MD Electronically signed by Lennie Odor MD Signature Date/Time: 07/01/2023/12:17:51 PM    Final    DG Chest 2 View  Result Date: 06/30/2023 CLINICAL DATA:  Shortness of breath. EXAM: CHEST - 2 VIEW COMPARISON:  October 27, 2020. FINDINGS: Stable cardiomegaly. Sternotomy wires are noted. Left-sided defibrillator is unchanged. Minimal bibasilar subsegmental atelectasis is noted with minimal pleural effusions. Bony thorax is unremarkable. IMPRESSION: Minimal bibasilar subsegmental atelectasis with minimal pleural effusions. Electronically Signed   By: Lupita Raider M.D.   On: 06/30/2023 16:01        Scheduled Meds:   allopurinol  100 mg Oral Daily   amiodarone  200 mg Oral Daily   aspirin  81 mg Oral Daily   atorvastatin  20 mg Oral Daily   dabigatran  150 mg Oral Q12H   dapagliflozin propanediol  10 mg Oral QAC breakfast   digoxin  125 mcg Oral QODAY   furosemide  60 mg Intravenous BID   isosorbide mononitrate  30 mg Oral QHS   levothyroxine  200 mcg Oral Q0600   loratadine  10 mg Oral Daily   metoprolol succinate  100 mg Oral Daily   montelukast  10 mg Oral QHS   multivitamin with minerals  1 tablet Oral Daily   pantoprazole  40 mg Oral Daily   potassium chloride SA  20 mEq Oral BID   sertraline  50 mg Oral Daily   Continuous Infusions:   LOS: 2 days    Time spent: 40 minutes    Dorcas Carrow, MD [facility]

## 2023-07-02 NOTE — Progress Notes (Addendum)
Patient Name: Justin [name] Date of Encounter: 07/02/2023 [facility] Cardiologist: Verne Carrow, MD   Interval Summary  .    We have no I's and O's during this admission.  He states that his shortness of breath and peripheral edema are improving some but says he only really respond to the Lasix for about 4 hours.  Vital Signs .    Vitals:   07/01/23 1232 07/01/23 1922 07/01/23 2126 07/02/23 0427  BP: 130/77 118/63 134/80 (!) 103/51  Pulse: 75 67 69 74  Resp: 20 20  20   Temp: 97.6 F (36.4 C) 97.6 F (36.4 C)  97.9 F (36.6 C)  TempSrc: Oral Oral  Oral  SpO2: 98% 98%  95%  Weight:        Intake/Output Summary (Last 24 hours) at 07/02/2023 0820 Last data filed at 07/01/2023 1800 Gross per 24 hour  Intake 240 ml  Output --  Net 240 ml      07/01/2023    5:31 AM 06/30/2023    6:08 PM 05/30/2023    6:00 AM  Last 3 Weights  Weight (lbs) 270 lb 6.4 oz 266 lb 1.5 oz 259 lb  Weight (kg) 122.653 kg 120.7 kg 117.482 kg      Telemetry/ECG    Atrial fibrillation, ventricularly paced.  Heart rates in the 70s to 80s.- Personally Reviewed  CV Studies    Echocardiogram 07/01/2023  1. LV appears mildly reduced with hypokinesis of the mid to apical  inferoseptum and mid inferior segments. This could be related to  pacing/postop septum. There is significant respiratory variation in septal  movement (best seen on image 102) which can be   seen in pericardial constriction. Clinical correlation is recommended.  Left ventricular ejection fraction, by estimation, is 40 to 45%. The left  ventricle has mildly decreased function. The left ventricle demonstrates  regional wall motion abnormalities  (see scoring diagram/findings for description). Left ventricular diastolic  function could not be evaluated.   2. Right ventricular systolic function is moderately reduced. The right  ventricular size is moderately enlarged. There is normal pulmonary artery  systolic  pressure. The estimated right ventricular systolic pressure is  29.3 mmHg.   3. Left atrial size was severely dilated.   4. Right atrial size was severely dilated.   5. The mitral valve is degenerative. Trivial mitral valve regurgitation.  No evidence of mitral stenosis. Moderate to severe mitral annular  calcification.   6. The aortic valve has been repaired/replaced. Aortic valve  regurgitation is not visualized. There is a 29 mm Edwards Sapien  prosthetic (TAVR) valve present in the aortic position. Procedure Date:  03/04/2017. Echo findings are consistent with normal  structure and function of the aortic valve prosthesis. Aortic valve area,  by VTI measures 2.83 cm. Aortic valve mean gradient measures 7.6 mmHg.  Aortic valve Vmax measures 1.86 m/s.   7. The inferior vena cava is dilated in size with <50% respiratory  variability, suggesting right atrial pressure of 15 mmHg.    Physical Exam .   GEN: No acute distress.   Neck: Difficult to assess JVD Cardiac: Irregularly irregular, no murmurs.  Wheezing, crackles in the lower bases bilaterally Respiratory: Clear to auscultation bilaterally. GI: Distended soft abdomen MS: 2+ edema  Patient Profile    BOB DAVERSA is a 89 y.o. male has hx of  HTN, HLD, severe aortic stenosis s/p TAVR, PAF, chronic diastolic and systolic CHF, CAD s/p CABG, syncope, junctional bradycardia, PVCs,  NSVT and LBBB s/p ICD implantation  and admitted on 06/30/2023 for the evaluation of acute on chronic CHF  Assessment & Plan .     Acute on chronic heart failure with mildly reduced EF Moderately reduced RV Moderate to severe MAC Patient seen outpatient and had been experiencing dizziness, low blood pressure, dizziness.  His GDMT was held including Eual Fines, [place], Lasix.  Imdur and metolazone twice weekly were continued. Presenting significantly SOB, orthopnea. Diuresed on IV Lasix 60 mg daily with no I's and O's recorded.  Slow progress  however he feels that he is not responding to the Lasix after about 4 hours.  Echocardiogram during this admission shows EF 40 to 45%, similar to previous.  He had some hypokinesis of the mid to apical inferoseptum and inferior segments which may be correlated to his pacemaker and left bundle branch block.  Will discuss with MD.  Still has clear signs of volume on PE and dilated IVC, PA pressure 15. Will increase to IV Lasix 60 mg twice daily.  Have discussed with nursing to collect I's and O's and weights today.  Continue to supplement potassium as needed Continue Farxiga 10 mg, Toprol-XL 100 mg daily (okay to continue for now, but could consider halfing dose), Imdur 30 mg.  Currently holding his Entresto 97-23 mg, spironolactone 25 mg.  Suspect he will need to decrease his home doses.  Reports baseline to be weight to be around 260-262.  Weight here to 270. May consider checking impedance to assess volume status once closer to euvolemia.  CKD Has had fluctuating renal function previously.  Baseline seems to be approximately 1.3-1.9.  Creatinine on admission 1.14> 1.30> 1.49.  Will continue with diuresis plan as there is a greater need for decongestion.   Persistent atrial fibrillation/flutter Status post ICD Multiple attempts for DCCV but he reverts back. Underwent GEN change on 05/30/2023 and has been persistent since then.  He is rate controlled currently with heart rates between 70-80. Currently on Pradaxa, amiodarone 200 mg daily, Toprol-XL 100 mg daily, digoxin 125 mcg every other day.  Subclinical levels <.4. There was discussion previously whether or not to continue digoxin.  Will defer to MD.  CAD status post CABG 2007 Underwent right and left heart catheterization in 2018 that showed all grafts were patent.  No anginal symptoms here. Continue aspirin, atorvastatin 20 mg, Toprol-XL, Imdur LDL from February shows 72. Will repeat.   Aortic stenosis status post TAVR Stable function on  echocardiogram.  Valve area 2.83, mean gradient 7.6.   For questions or updates, please contact [facility] Please consult [email or web address] for contact info under        Signed, Abagail Kitchens, PA-C

## 2023-07-02 NOTE — Progress Notes (Signed)
OT Cancellation Note  Patient Details Name: Justin [name] MRN: [id number] DOB: 22-May-1934   Cancelled Treatment:    Reason Eval/Treat Not Completed: OT screened, no needs identified, will sign off Patient reported being independent in [place] in room. Patient was educated on skilled OT role in continuum of care. Patient reported he did not need OT at this time. OT to sign off.  Rosalio Loud, MS Acute Rehabilitation Department Office# [phone] 07/02/2023, 8:47 AM

## 2023-07-03 ENCOUNTER — Encounter: Payer: Self-pay | Admitting: Cardiovascular Disease

## 2023-07-03 DIAGNOSIS — I5043 Acute on chronic combined systolic (congestive) and diastolic (congestive) heart failure: Secondary | ICD-10-CM | POA: Diagnosis not present

## 2023-07-03 LAB — LIPID PANEL
Cholesterol: 74 mg/dL (ref 0–200)
HDL: 25 mg/dL — ABNORMAL LOW (ref >40)
LDL Cholesterol: 30 mg/dL (ref 0–99)
Total CHOL/HDL Ratio: 3 {ratio}
Triglycerides: 96 mg/dL (ref <150)
VLDL: 19 mg/dL (ref 0–40)

## 2023-07-03 LAB — BASIC METABOLIC PANEL
Anion gap: 11 (ref 5–15)
BUN: 20 mg/dL (ref 8–23)
CO2: 26 mmol/L (ref 22–32)
Calcium: 7.6 mg/dL — ABNORMAL LOW (ref 8.9–10.3)
Chloride: 98 mmol/L (ref 98–111)
Creatinine, Ser: 1.23 mg/dL (ref 0.61–1.24)
GFR, Estimated: 57 mL/min — ABNORMAL LOW (ref >60)
Glucose, Bld: 102 mg/dL — ABNORMAL HIGH (ref 70–99)
Potassium: 3.3 mmol/L — ABNORMAL LOW (ref 3.5–5.1)
Sodium: 135 mmol/L (ref 135–145)

## 2023-07-03 MED ORDER — POTASSIUM CHLORIDE CRYS ER 20 MEQ PO TBCR
40.0000 meq | EXTENDED_RELEASE_TABLET | Freq: Once | ORAL | Status: AC
  Administered 2023-07-03: 40 meq via ORAL
  Filled 2023-07-03: qty 2

## 2023-07-03 NOTE — Progress Notes (Signed)
PROGRESS NOTE    HATEM CULL  VZD:[id number] DOB: [birth year] DOA: 06/30/2023 PCP: Nelwyn Salisbury, MD    Brief Narrative:  89 year old with history of VT, sick sinus syndrome status post recent pacemaker placement, A-fib a flutter, aortic stenosis status post TAVR, CAD status post CABG, obesity, hypothyroidism and hypertension, chronic combined heart failure presented to the ER for progressive shortness of breath since he had pacemaker placed on October 4.  He has been more fatigued and dyspneic on exertion.  Also had leg swelling.  In the emergency room hemodynamically stable.  On room air.  Creatinine 1.14.  BNP 392.  Troponin 16.  Chest x-ray with bibasilar subsegmental atelectasis with minimal pleural effusion.  Patient was admitted for CHF exacerbation.  Subjective:  Patient seen and examined.  Feels better.  Legs are still swollen.  Creatinine down.  Urine output about 1.5 L overnight.  Assessment & Plan:   Acute on chronic, right heart failure: Previous history of type I diastolic dysfunction.  Echocardiogram today shows ejection fraction 45 to 50%. Presented with orthopnea, leg edema, cardiomegaly on x-ray.  Increasing shortness of breath. Patient is on IV diuretics and clinically responding.  Renal functions improving.  Continue IV Lasix with potassium replacement. Followed by cardiology. Patient is on digoxin, Imdur, metoprolol XL 100 mg.  Followed by cardiology. Follow-up renal function test.  Renal functions stabilizing.  Replace potassium.  Chronic medical issues including Persistent A-fib flutter, frequent PVCs, sick sinus syndrome status post pacemaker: Currently paced rhythm.  Stable. On amiodarone, Pradaxa, digoxin.  Electrolytes are adequate.  Keep on potassium replacement. Essential hypertension, stable on nitrates and metoprolol. Hyperlipidemia, on atorvastatin 20 Coronary artery disease status post CABG, on aspirin as well as Pradaxa. Hypothyroidism, on  levothyroxine 200 GERD, on Protonix Gout, on allopurinol     DVT prophylaxis:  dabigatran (PRADAXA) capsule 150 mg   Code Status: DNR Family Communication: None at the bedside Disposition Plan: Status is: Inpatient Remains inpatient appropriate because: Significant fluid overload, on IV diuresis     Consultants:  Cardiology  Procedures:  Echocardiogram  Antimicrobials:  None     Objective: Vitals:   07/03/23 0945 07/03/23 1053 07/03/23 1242 07/03/23 1344  BP:  118/65 115/63   Pulse:  63 71   Resp:   16   Temp:   97.7 F (36.5 C)   TempSrc:   Oral   SpO2:   99%   Weight: 121.8 kg     Height:    5\' 7"  (1.702 m)    Intake/Output Summary (Last 24 hours) at 07/03/2023 1359 Last data filed at 07/03/2023 0831 Gross per 24 hour  Intake 240 ml  Output 700 ml  Net -460 ml   Filed Weights   07/02/23 0900 07/03/23 0500 07/03/23 0945  Weight: 123.4 kg 124.6 kg 121.8 kg    Examination:  General exam: Appears calm and comfortable sitting in chair.  Currently on room air. Respiratory system: No added sounds.  On room air.   Cardiovascular system: S1 & S2 heard, RRR.  Unable to appreciate any JVD.  No murmurs, rubs, gallops or clicks.  1+ pedal edema bilateral.  Gastrointestinal system: Abdomen is nondistended, soft and nontender. No organomegaly or masses felt. Normal bowel sounds heard.  Obese and pendulous. Central nervous system: Alert and oriented. No focal neurological deficits.   Data Reviewed: I have personally reviewed following labs and imaging studies  CBC: Recent Labs  Lab 06/30/23 1321 07/01/23 0355  WBC 11.5* 11.9*  NEUTROABS 8.9*  --   HGB 12.5* 11.2*  HCT 41.6 36.9*  MCV 87.6 87.4  PLT 313 283   Basic Metabolic Panel: Recent Labs  Lab 06/30/23 1321 06/30/23 1616 07/01/23 0355 07/02/23 0415 07/03/23 0426  NA 140  --  140 136 135  K 4.0  --  4.1 3.6 3.3*  CL 107  --  106 101 98  CO2 24  --  24 27 26   GLUCOSE 133*  --  125* 108*  102*  BUN 14  --  14 16 20   CREATININE 1.14  --  1.30* 1.49* 1.23  CALCIUM 8.2*  --  7.8* 7.9* 7.6*  MG  --  2.7*  --   --   --   PHOS  --  2.3* 3.3  --   --    GFR: Estimated Creatinine Clearance: 52.9 mL/min (by C-G formula based on SCr of 1.23 mg/dL). Liver Function Tests: Recent Labs  Lab 06/30/23 1321  AST 30  ALT 26  ALKPHOS 132*  BILITOT 1.1  PROT 7.0  ALBUMIN 2.7*   No results for input(s): "LIPASE", "AMYLASE" in the last 168 hours. No results for input(s): "AMMONIA" in the last 168 hours. Coagulation Profile: No results for input(s): "INR", "PROTIME" in the last 168 hours. Cardiac Enzymes: No results for input(s): "CKTOTAL", "CKMB", "CKMBINDEX", "TROPONINI" in the last 168 hours. BNP (last 3 results) Recent Labs    10/16/22 1259  PROBNP 2,293*   HbA1C: No results for input(s): "HGBA1C" in the last 72 hours. CBG: Recent Labs  Lab 07/01/23 0730 07/01/23 1113 07/01/23 1615  GLUCAP 124* 109* 124*   Lipid Profile: Recent Labs    07/03/23 0426  CHOL 74  HDL 25*  LDLCALC 30  TRIG 96  CHOLHDL 3.0   Thyroid Function Tests: Recent Labs    06/30/23 1637  TSH 9.303*  FREET4 1.21*   Anemia Panel: Recent Labs    06/30/23 1637  FERRITIN 28  TIBC 400  IRON 151   Sepsis Labs: No results for input(s): "PROCALCITON", "LATICACIDVEN" in the last 168 hours.  No results found for this or any previous visit (from the past 240 hour(s)).       Radiology Studies: No results found.      Scheduled Meds:  allopurinol  100 mg Oral Daily   amiodarone  200 mg Oral Daily   aspirin  81 mg Oral Daily   atorvastatin  20 mg Oral Daily   dabigatran  150 mg Oral Q12H   dapagliflozin propanediol  10 mg Oral QAC breakfast   digoxin  125 mcg Oral QODAY   furosemide  60 mg Intravenous BID   isosorbide mononitrate  30 mg Oral QHS   levothyroxine  200 mcg Oral Q0600   loratadine  10 mg Oral Daily   metoprolol succinate  100 mg Oral Daily   montelukast  10 mg  Oral QHS   multivitamin with minerals  1 tablet Oral Daily   pantoprazole  40 mg Oral Daily   potassium chloride SA  20 mEq Oral BID   sertraline  50 mg Oral Daily   Continuous Infusions:   LOS: 3 days    Time spent: 40 minutes    Dorcas Carrow, MD [facility]

## 2023-07-03 NOTE — Progress Notes (Signed)
Mobility Specialist - Progress Note   07/03/23 1524  Mobility  Activity Ambulated independently in room  Level of Assistance Independent  Assistive Device None  Distance Ambulated (ft) 350 ft  Activity Response Tolerated well  Mobility Referral Yes  $Mobility charge 1 Mobility  Mobility Specialist Start Time (ACUTE ONLY) 1253  Mobility Specialist Stop Time (ACUTE ONLY) 1302  Mobility Specialist Time Calculation (min) (ACUTE ONLY) 9 min   Pt received in recliner and agreeable to mobility. No complaints during session. Pt to recliner after session with all needs met.    Pre-mobility: 89 HR, 96% SpO2 (RA) During mobility: 92% SpO2 (RA) Post-mobility: 97 HR, 95% SPO2 (RA)  Chief Technology Officer

## 2023-07-03 NOTE — Plan of Care (Signed)
  Problem: Education: Goal: Ability to demonstrate management of disease process will improve Outcome: Progressing Goal: Ability to verbalize understanding of medication therapies will improve Outcome: Progressing Goal: Individualized Educational Video(s) Outcome: Progressing   Problem: Activity: Goal: Capacity to carry out activities will improve Outcome: Progressing   Problem: Cardiac: Goal: Ability to achieve and maintain adequate cardiopulmonary perfusion will improve Outcome: Progressing   Problem: Education: Goal: Knowledge of General Education information will improve Description: Including pain rating scale, medication(s)/side effects and non-pharmacologic comfort measures Outcome: Progressing   Problem: Health Behavior/Discharge Planning: Goal: Ability to manage health-related needs will improve Outcome: Progressing   Problem: Clinical Measurements: Goal: Ability to maintain clinical measurements within normal limits will improve Outcome: Progressing Goal: Will remain free from infection Outcome: Progressing Goal: Diagnostic test results will improve Outcome: Progressing Goal: Respiratory complications will improve Outcome: Progressing Goal: Cardiovascular complication will be avoided Outcome: Progressing   Problem: Activity: Goal: Risk for activity intolerance will decrease Outcome: Progressing   Problem: Nutrition: Goal: Adequate nutrition will be maintained Outcome: Progressing   Problem: Coping: Goal: Level of anxiety will decrease Outcome: Progressing   Problem: Elimination: Goal: Will not experience complications related to bowel motility Outcome: Progressing Goal: Will not experience complications related to urinary retention Outcome: Progressing   Problem: Pain Management: Goal: General experience of comfort will improve Outcome: Progressing   Problem: Safety: Goal: Ability to remain free from injury will improve Outcome: Progressing    Problem: Skin Integrity: Goal: Risk for impaired skin integrity will decrease Outcome: Progressing

## 2023-07-03 NOTE — Progress Notes (Signed)
Patient Name: Justin [name] Date of Encounter: 07/03/2023 [facility] Cardiologist: Verne Carrow, MD   Interval Summary  .    Diuresed about 1.5 L yesterday.  Significant improvement in symptoms.  Still has considerable peripheral edema.  But overall feeling much better. Vital Signs .    Vitals:   07/02/23 2132 07/03/23 0409 07/03/23 0500 07/03/23 0800  BP: (!) 143/85 102/62    Pulse: 71 68    Resp:  18  17  Temp:  97.7 F (36.5 C)    TempSrc:  Oral    SpO2:  95%    Weight:   124.6 kg     Intake/Output Summary (Last 24 hours) at 07/03/2023 0935 Last data filed at 07/03/2023 0831 Gross per 24 hour  Intake 240 ml  Output 1500 ml  Net -1260 ml      07/03/2023    5:00 AM 07/02/2023    9:00 AM 07/01/2023    5:31 AM  Last 3 Weights  Weight (lbs) 274 lb 11.1 oz 272 lb 0.8 oz 270 lb 6.4 oz  Weight (kg) 124.6 kg 123.4 kg 122.653 kg      Telemetry/ECG    Atrial fibrillation, ventricularly paced.  Heart rates in the 70s to 80s.- Personally Reviewed  CV Studies    Echocardiogram 07/01/2023  1. LV appears mildly reduced with hypokinesis of the mid to apical  inferoseptum and mid inferior segments. This could be related to  pacing/postop septum. There is significant respiratory variation in septal  movement (best seen on image 102) which can be   seen in pericardial constriction. Clinical correlation is recommended.  Left ventricular ejection fraction, by estimation, is 40 to 45%. The left  ventricle has mildly decreased function. The left ventricle demonstrates  regional wall motion abnormalities  (see scoring diagram/findings for description). Left ventricular diastolic  function could not be evaluated.   2. Right ventricular systolic function is moderately reduced. The right  ventricular size is moderately enlarged. There is normal pulmonary artery  systolic pressure. The estimated right ventricular systolic pressure is  29.3 mmHg.   3. Left atrial  size was severely dilated.   4. Right atrial size was severely dilated.   5. The mitral valve is degenerative. Trivial mitral valve regurgitation.  No evidence of mitral stenosis. Moderate to severe mitral annular  calcification.   6. The aortic valve has been repaired/replaced. Aortic valve  regurgitation is not visualized. There is a 29 mm Edwards Sapien  prosthetic (TAVR) valve present in the aortic position. Procedure Date:  03/04/2017. Echo findings are consistent with normal  structure and function of the aortic valve prosthesis. Aortic valve area,  by VTI measures 2.83 cm. Aortic valve mean gradient measures 7.6 mmHg.  Aortic valve Vmax measures 1.86 m/s.   7. The inferior vena cava is dilated in size with <50% respiratory  variability, suggesting right atrial pressure of 15 mmHg.    Physical Exam .   GEN: No acute distress.   Neck: Difficult to assess JVD Cardiac: Irregularly irregular, no murmurs.  Wheezing, crackles in the lower bases bilaterally Respiratory: Clear to auscultation bilaterally. GI: Distended soft abdomen MS: 2+ edema  Patient Profile    Justin [name] is a 89 y.o. male has hx of  HTN, HLD, severe aortic stenosis s/p TAVR, PAF, chronic diastolic and systolic CHF, CAD s/p CABG, syncope, junctional bradycardia, PVCs, NSVT and LBBB s/p ICD implantation  and admitted on 06/30/2023 for the evaluation of acute on  chronic CHF  Assessment & Plan .     Acute on chronic heart failure with mildly reduced EF Moderately reduced RV Moderate to severe MAC Patient seen outpatient and had been experiencing dizziness, low blood pressure, dizziness.  His GDMT was held including Eual Fines, [place], Lasix.  Imdur and metolazone twice weekly were continued. Presenting significantly SOB, orthopnea. Echocardiogram during this admission shows EF 40 to 45%, similar to previous.  He had some hypokinesis of the mid to apical inferoseptum and inferior segments which may be  correlated to his pacemaker and left bundle branch block. Echo with dilated IVC, PA pressure 15.  We increase IV Lasix to 60 mg twice a day from daily, significant improvement in shortness of breath.  Diuresed 1.5 L.  Renal function actually improved.  Continue plan as below.   Continue IV Lasix 60 mg twice daily.  Continue to supplement potassium as needed Continue Farxiga 10 mg, Toprol-XL 100 mg daily, Imdur 30 mg.  Currently holding his Entresto 97-23 mg, spironolactone 25 mg.  Suspect he will need to decrease his home doses.  Reports baseline to be weight to be around 260-262.  Weight here has been increasing, will recheck today.  Doubt accuracy May consider checking impedance to assess volume status once closer to euvolemia.  CKD Has had fluctuating renal function previously.  Baseline seems to be approximately 1.3-1.9.  Creatinine on admission 1.14> 1.30> 1.49>1.23. Normalized today suggesting CRS.    Persistent atrial fibrillation/flutter Status post ICD Multiple attempts for DCCV but he reverts back. Underwent GEN change on 05/30/2023 and has been persistent since then.  He is rate controlled currently with heart rates between 70-80. Currently on Pradaxa, amiodarone 200 mg daily, Toprol-XL 100 mg daily, digoxin 125 mcg every other day.  Subclinical levels <.4. Per MD will continue for now.   CAD status post CABG 2007 Underwent right and left heart catheterization in 2018 that showed all grafts were patent.  No anginal symptoms here. Continue aspirin, atorvastatin 20 mg, Toprol-XL, Imdur LDL this admission shows 32.  Significant improvement from previous.  Aortic stenosis status post TAVR Stable function on echocardiogram.  Valve area 2.83, mean gradient 7.6.   For questions or updates, please contact [facility] Please consult [email or web address] for contact info under        Signed, Abagail Kitchens, PA-C

## 2023-07-04 DIAGNOSIS — Z950 Presence of cardiac pacemaker: Secondary | ICD-10-CM | POA: Diagnosis not present

## 2023-07-04 DIAGNOSIS — I5043 Acute on chronic combined systolic (congestive) and diastolic (congestive) heart failure: Secondary | ICD-10-CM | POA: Diagnosis not present

## 2023-07-04 DIAGNOSIS — I519 Heart disease, unspecified: Secondary | ICD-10-CM | POA: Diagnosis not present

## 2023-07-04 DIAGNOSIS — I129 Hypertensive chronic kidney disease with stage 1 through stage 4 chronic kidney disease, or unspecified chronic kidney disease: Secondary | ICD-10-CM | POA: Diagnosis not present

## 2023-07-04 LAB — BASIC METABOLIC PANEL
Anion gap: 10 (ref 5–15)
BUN: 20 mg/dL (ref 8–23)
CO2: 27 mmol/L (ref 22–32)
Calcium: 7.6 mg/dL — ABNORMAL LOW (ref 8.9–10.3)
Chloride: 99 mmol/L (ref 98–111)
Creatinine, Ser: 1.29 mg/dL — ABNORMAL HIGH (ref 0.61–1.24)
GFR, Estimated: 54 mL/min — ABNORMAL LOW (ref >60)
Glucose, Bld: 112 mg/dL — ABNORMAL HIGH (ref 70–99)
Potassium: 3.5 mmol/L (ref 3.5–5.1)
Sodium: 136 mmol/L (ref 135–145)

## 2023-07-04 MED ORDER — SPIRONOLACTONE 25 MG PO TABS
25.0000 mg | ORAL_TABLET | Freq: Every day | ORAL | Status: DC
  Administered 2023-07-04 – 2023-07-06 (×3): 25 mg via ORAL
  Filled 2023-07-04 (×3): qty 1

## 2023-07-04 NOTE — Progress Notes (Signed)
Mobility Specialist - Progress Note  Pre-mobility: 80 bpm HR,  During mobility: 95 bpm HR,  Post-mobility: 91 bpm HR,    07/04/23 1030  Mobility  Activity Ambulated independently in hallway  Level of Assistance Independent  Assistive Device None  Distance Ambulated (ft) 350 ft  Range of Motion/Exercises Active  Activity Response Tolerated well  Mobility Referral Yes  $Mobility charge 1 Mobility  Mobility Specialist Start Time (ACUTE ONLY) 1020  Mobility Specialist Stop Time (ACUTE ONLY) 1030  Mobility Specialist Time Calculation (min) (ACUTE ONLY) 10 min   Pt was found in room and agreeable to ambulate. Stated feeling a little SOB at EOS. Returned to recliner chair with all needs met. Call bell in reach.  Billey Chang Mobility Specialist

## 2023-07-04 NOTE — Plan of Care (Signed)
  Problem: Education: Goal: Ability to demonstrate management of disease process will improve Outcome: Progressing Goal: Ability to verbalize understanding of medication therapies will improve Outcome: Progressing Goal: Individualized Educational Video(s) Outcome: Progressing   Problem: Activity: Goal: Capacity to carry out activities will improve Outcome: Progressing   Problem: Cardiac: Goal: Ability to achieve and maintain adequate cardiopulmonary perfusion will improve Outcome: Progressing   Problem: Education: Goal: Knowledge of General Education information will improve Description: Including pain rating scale, medication(s)/side effects and non-pharmacologic comfort measures Outcome: Progressing   Problem: Health Behavior/Discharge Planning: Goal: Ability to manage health-related needs will improve Outcome: Progressing   Problem: Clinical Measurements: Goal: Ability to maintain clinical measurements within normal limits will improve Outcome: Progressing Goal: Will remain free from infection Outcome: Progressing Goal: Diagnostic test results will improve Outcome: Progressing Goal: Respiratory complications will improve Outcome: Progressing Goal: Cardiovascular complication will be avoided Outcome: Progressing   Problem: Activity: Goal: Risk for activity intolerance will decrease Outcome: Progressing   Problem: Nutrition: Goal: Adequate nutrition will be maintained Outcome: Progressing   Problem: Coping: Goal: Level of anxiety will decrease Outcome: Progressing   Problem: Elimination: Goal: Will not experience complications related to bowel motility Outcome: Progressing Goal: Will not experience complications related to urinary retention Outcome: Progressing   Problem: Pain Management: Goal: General experience of comfort will improve Outcome: Progressing   Problem: Safety: Goal: Ability to remain free from injury will improve Outcome: Progressing    Problem: Skin Integrity: Goal: Risk for impaired skin integrity will decrease Outcome: Progressing

## 2023-07-04 NOTE — Progress Notes (Signed)
PROGRESS NOTE    Justin [name]  MVH:[id number] DOB: 12/21/1933 DOA: 06/30/2023 PCP: Nelwyn Salisbury, MD    Brief Narrative:  89 year old with history of VT, sick sinus syndrome status post recent pacemaker placement, A-fib a flutter, aortic stenosis status post TAVR, CAD status post CABG, obesity, hypothyroidism and hypertension, chronic combined heart failure presented to the ER for progressive shortness of breath since he had pacemaker placed on October 4.  He has been more fatigued and dyspneic on exertion.  Also had leg swelling.  In the emergency room hemodynamically stable.  On room air.  Creatinine 1.14.  BNP 392.  Troponin 16.  Chest x-ray with bibasilar subsegmental atelectasis with minimal pleural effusion.  Patient was admitted for CHF exacerbation. Remains in the hospital on IV diuresis.  Subjective:  Patient seen and examined.  Daughter on the speaker phone.  Occasional wheezing feeling.  Legs are still swollen.  Overall feels better.  Assessment & Plan:   Acute on chronic, right heart failure: Previous history of type I diastolic dysfunction.  Echocardiogram today shows ejection fraction 45 to 50%. Presented with orthopnea, leg edema, cardiomegaly on x-ray.  Increasing shortness of breath. Patient is on IV diuretics and clinically responding.  Renal functions improving.  Continue IV Lasix with potassium replacement. Followed by cardiology. Patient is on digoxin, Imdur, metoprolol XL 100 mg.  Followed by cardiology. Follow-up renal function test.  Renal functions stabilizing and will need close monitoring.  Slight fluctuation today. Entresto on hold.  Chronic medical issues including Persistent A-fib flutter, frequent PVCs, sick sinus syndrome status post pacemaker: Currently paced rhythm.  Stable. On amiodarone, Pradaxa, digoxin.  Electrolytes are adequate.  Keep on potassium replacement. Essential hypertension, stable on nitrates and metoprolol. Hyperlipidemia, on  atorvastatin 20 Coronary artery disease status post CABG, on aspirin as well as Pradaxa. Hypothyroidism, on levothyroxine 200 GERD, on Protonix Gout, on allopurinol     DVT prophylaxis:  dabigatran (PRADAXA) capsule 150 mg   Code Status: DNR Family Communication: Daughter on the phone. Disposition Plan: Status is: Inpatient Remains inpatient appropriate because: Significant fluid overload, on IV diuresis     Consultants:  Cardiology  Procedures:  Echocardiogram  Antimicrobials:  None     Objective: Vitals:   07/03/23 2030 07/04/23 0319 07/04/23 0800 07/04/23 0859  BP: 114/77 129/89  126/65  Pulse: 89 75  73  Resp: 20 20 20 16   Temp: 97.9 F (36.6 C) 97.8 F (36.6 C)  97.6 F (36.4 C)  TempSrc: Oral Oral  Oral  SpO2: 96% 95%  97%  Weight:  120.9 kg    Height:        Intake/Output Summary (Last 24 hours) at 07/04/2023 1248 Last data filed at 07/04/2023 1100 Gross per 24 hour  Intake 840 ml  Output 950 ml  Net -110 ml   Filed Weights   07/03/23 0500 07/03/23 0945 07/04/23 0319  Weight: 124.6 kg 121.8 kg 120.9 kg    Examination:  General exam: Appears calm and comfortable.  On room air.  Pleasant to conversation. Respiratory system: No added sounds.  On room air.   Cardiovascular system: S1 & S2 heard, RRR.  Unable to appreciate any JVD.  No murmurs, rubs, gallops or clicks.  1+ pedal edema bilateral.  Gastrointestinal system: Abdomen is nondistended, soft and nontender. No organomegaly or masses felt. Normal bowel sounds heard.  Obese and pendulous. Central nervous system: Alert and oriented. No focal neurological deficits.   Data Reviewed: I have personally reviewed  following labs and imaging studies  CBC: Recent Labs  Lab 06/30/23 1321 07/01/23 0355  WBC 11.5* 11.9*  NEUTROABS 8.9*  --   HGB 12.5* 11.2*  HCT 41.6 36.9*  MCV 87.6 87.4  PLT 313 283   Basic Metabolic Panel: Recent Labs  Lab 06/30/23 1321 06/30/23 1616 07/01/23 0355  07/02/23 0415 07/03/23 0426 07/04/23 0412  NA 140  --  140 136 135 136  K 4.0  --  4.1 3.6 3.3* 3.5  CL 107  --  106 101 98 99  CO2 24  --  24 27 26 27   GLUCOSE 133*  --  125* 108* 102* 112*  BUN 14  --  14 16 20 20   CREATININE 1.14  --  1.30* 1.49* 1.23 1.29*  CALCIUM 8.2*  --  7.8* 7.9* 7.6* 7.6*  MG  --  2.7*  --   --   --   --   PHOS  --  2.3* 3.3  --   --   --    GFR: Estimated Creatinine Clearance: 50.2 mL/min (A) (by C-G formula based on SCr of 1.29 mg/dL (H)). Liver Function Tests: Recent Labs  Lab 06/30/23 1321  AST 30  ALT 26  ALKPHOS 132*  BILITOT 1.1  PROT 7.0  ALBUMIN 2.7*   No results for input(s): "LIPASE", "AMYLASE" in the last 168 hours. No results for input(s): "AMMONIA" in the last 168 hours. Coagulation Profile: No results for input(s): "INR", "PROTIME" in the last 168 hours. Cardiac Enzymes: No results for input(s): "CKTOTAL", "CKMB", "CKMBINDEX", "TROPONINI" in the last 168 hours. BNP (last 3 results) Recent Labs    10/16/22 1259  PROBNP 2,293*   HbA1C: No results for input(s): "HGBA1C" in the last 72 hours. CBG: Recent Labs  Lab 07/01/23 0730 07/01/23 1113 07/01/23 1615  GLUCAP 124* 109* 124*   Lipid Profile: Recent Labs    07/03/23 0426  CHOL 74  HDL 25*  LDLCALC 30  TRIG 96  CHOLHDL 3.0   Thyroid Function Tests: No results for input(s): "TSH", "T4TOTAL", "FREET4", "T3FREE", "THYROIDAB" in the last 72 hours.  Anemia Panel: No results for input(s): "VITAMINB12", "FOLATE", "FERRITIN", "TIBC", "IRON", "RETICCTPCT" in the last 72 hours.  Sepsis Labs: No results for input(s): "PROCALCITON", "LATICACIDVEN" in the last 168 hours.  No results found for this or any previous visit (from the past 240 hour(s)).       Radiology Studies: No results found.      Scheduled Meds:  allopurinol  100 mg Oral Daily   amiodarone  200 mg Oral Daily   aspirin  81 mg Oral Daily   atorvastatin  20 mg Oral Daily   dabigatran  150 mg  Oral Q12H   dapagliflozin propanediol  10 mg Oral QAC breakfast   digoxin  125 mcg Oral QODAY   furosemide  60 mg Intravenous BID   isosorbide mononitrate  30 mg Oral QHS   levothyroxine  200 mcg Oral Q0600   loratadine  10 mg Oral Daily   metoprolol succinate  100 mg Oral Daily   montelukast  10 mg Oral QHS   multivitamin with minerals  1 tablet Oral Daily   pantoprazole  40 mg Oral Daily   potassium chloride SA  20 mEq Oral BID   sertraline  50 mg Oral Daily   spironolactone  25 mg Oral Daily   Continuous Infusions:   LOS: 4 days    Time spent: 40 minutes    Dorcas Carrow,  MD [facility]

## 2023-07-04 NOTE — Progress Notes (Signed)
Patient Name: Justin [name] Date of Encounter: 07/04/2023  [facility] Cardiologist: Verne Carrow, MD   Interval Summary  .    We do not have any I/Os today.  Ambulated yesterday with some shortness of breath however still improving.  Diuresing slowly, but weight today is 2 pounds down.  Discussed need for urinating in the urinal.  Vital Signs .    Vitals:   07/03/23 2030 07/04/23 0319 07/04/23 0800 07/04/23 0859  BP: 114/77 129/89  126/65  Pulse: 89 75  73  Resp: 20 20 20 16   Temp: 97.9 F (36.6 C) 97.8 F (36.6 C)  97.6 F (36.4 C)  TempSrc: Oral Oral  Oral  SpO2: 96% 95%  97%  Weight:  120.9 kg    Height:        Intake/Output Summary (Last 24 hours) at 07/04/2023 0910 Last data filed at 07/03/2023 1500 Gross per 24 hour  Intake 600 ml  Output --  Net 600 ml      07/04/2023    3:19 AM 07/03/2023    9:45 AM 07/03/2023    5:00 AM  Last 3 Weights  Weight (lbs) 266 lb 8 oz 268 lb 8 oz 274 lb 11.1 oz  Weight (kg) 120.884 kg 121.791 kg 124.6 kg      Telemetry/ECG    Atrial fibrillation, ventricularly paced.  Heart rates in the 70s to 80s. PVCs.- Personally Reviewed  CV Studies    Echocardiogram 07/01/2023  1. LV appears mildly reduced with hypokinesis of the mid to apical  inferoseptum and mid inferior segments. This could be related to  pacing/postop septum. There is significant respiratory variation in septal  movement (best seen on image 102) which can be   seen in pericardial constriction. Clinical correlation is recommended.  Left ventricular ejection fraction, by estimation, is 40 to 45%. The left  ventricle has mildly decreased function. The left ventricle demonstrates  regional wall motion abnormalities  (see scoring diagram/findings for description). Left ventricular diastolic  function could not be evaluated.   2. Right ventricular systolic function is moderately reduced. The right  ventricular size is moderately enlarged. There is  normal pulmonary artery  systolic pressure. The estimated right ventricular systolic pressure is  29.3 mmHg.   3. Left atrial size was severely dilated.   4. Right atrial size was severely dilated.   5. The mitral valve is degenerative. Trivial mitral valve regurgitation.  No evidence of mitral stenosis. Moderate to severe mitral annular  calcification.   6. The aortic valve has been repaired/replaced. Aortic valve  regurgitation is not visualized. There is a 29 mm Edwards Sapien  prosthetic (TAVR) valve present in the aortic position. Procedure Date:  03/04/2017. Echo findings are consistent with normal  structure and function of the aortic valve prosthesis. Aortic valve area,  by VTI measures 2.83 cm. Aortic valve mean gradient measures 7.6 mmHg.  Aortic valve Vmax measures 1.86 m/s.   7. The inferior vena cava is dilated in size with <50% respiratory  variability, suggesting right atrial pressure of 15 mmHg.    Physical Exam .   GEN: No acute distress.   Neck: Difficult to assess JVD Cardiac: Irregularly irregular, no murmurs.   Respiratory: Clear to auscultation bilaterally. Improved  GI: Distended soft abdomen, improving  MS: 2+ edema, seems to be more distal today   Patient Profile    Justin [name] is a 89 y.o. male has hx of  HTN, HLD, severe aortic stenosis s/p  TAVR, PAF, chronic diastolic and systolic CHF, CAD s/p CABG, syncope, junctional bradycardia, PVCs, NSVT and LBBB s/p ICD implantation  and admitted on 06/30/2023 for the evaluation of acute on chronic CHF  Assessment & Plan .     Acute on chronic heart failure with mildly reduced EF Moderately reduced RV Moderate to severe MAC Patient seen outpatient and had been experiencing dizziness, low blood pressure, dizziness.  His GDMT was held including Eual Fines, [place], Lasix.  Imdur and metolazone twice weekly were continued. Presenting significantly SOB, orthopnea. Echocardiogram during this admission shows  EF 40 to 45%, similar to previous.  He had some hypokinesis of the mid to apical inferoseptum and inferior segments which may be correlated to his pacemaker and left bundle branch block. Echo with dilated IVC, PA pressure 15.  No I's and O's for today.  However symptoms and weight suggest that we are making decent progress.  Continue plan below.  Blood pressure looks better today so I will add back his spironolactone 25 mg.  Can consider adding back his Entresto at a lower dose given that he has had issues with dizziness and hypotension PTA.  Assess tomorrow what his potassium supplementation should be, will continue same regiment for now.  Continue IV Lasix 60 mg twice daily.  Continue to supplement potassium as needed Continue Farxiga 10 mg, Toprol-XL 100 mg daily, Imdur 30 mg. Adding back his spiro 25mg  Currently holding his Entresto 97-23 mg. Suspect he will need to decrease his home doses.  Reports baseline to be weight to be around 260-262.  266 today and improving May consider checking impedance to assess volume status once closer to euvolemia.  CKD Has had fluctuating renal function previously.  Baseline seems to be approximately 1.3-1.9.  Creatinine on admission 1.14> 1.30> 1.49>1.23>1.29   Persistent atrial fibrillation/flutter Status post ICD Multiple attempts for DCCV but he reverts back. Underwent GEN change on 05/30/2023 and has been persistent since then.  He is rate controlled currently with heart rates between 70-80. Currently on Pradaxa, amiodarone 200 mg daily, Toprol-XL 100 mg daily, digoxin 125 mcg every other day.  Subclinical levels <.4. Per MD will continue for now.   CAD status post CABG 2007 Underwent right and left heart catheterization in 2018 that showed all grafts were patent.  No anginal symptoms here. Continue aspirin, atorvastatin 20 mg, Toprol-XL, Imdur LDL this admission shows 32.  Significant improvement from previous.  Aortic stenosis status post  TAVR Stable function on echocardiogram.  Valve area 2.83, mean gradient 7.6.   For questions or updates, please contact [facility] Please consult [email or web address] for contact info under        Signed, Abagail Kitchens, PA-C

## 2023-07-04 NOTE — TOC CM/SW Note (Signed)
Transition of Care [facility]) - Inpatient Brief Assessment   Patient Details  Name: Justin [name] MRN: [id number] Date of Birth: 1934-08-09  [facility]) CM/SW Contact:    Larrie Kass, LCSW Phone Number: 07/04/2023, 12:07 PM   Clinical Narrative:  Spoke with pt's daughter, [facility], plan is to return. Pt uses rollator at baseline, no DME needs at this time. Pt's daughter reports she will proved transportation upon d/c.    Transition of Care Asessment: Insurance and Status: Insurance coverage has been reviewed Patient has primary care physician: Yes Home environment has been reviewed: independent living facility with self Prior level of function:: mod independent Prior/Current Home Services: No current home services Social Determinants of Health Reivew: SDOH reviewed no interventions necessary Readmission risk has been reviewed: Yes Transition of care needs: no transition of care needs at this time

## 2023-07-05 DIAGNOSIS — Z950 Presence of cardiac pacemaker: Secondary | ICD-10-CM

## 2023-07-05 DIAGNOSIS — I4819 Other persistent atrial fibrillation: Secondary | ICD-10-CM

## 2023-07-05 DIAGNOSIS — N1831 Chronic kidney disease, stage 3a: Secondary | ICD-10-CM

## 2023-07-05 DIAGNOSIS — Z951 Presence of aortocoronary bypass graft: Secondary | ICD-10-CM

## 2023-07-05 DIAGNOSIS — I2510 Atherosclerotic heart disease of native coronary artery without angina pectoris: Secondary | ICD-10-CM

## 2023-07-05 DIAGNOSIS — N183 Chronic kidney disease, stage 3 unspecified: Secondary | ICD-10-CM

## 2023-07-05 DIAGNOSIS — I519 Heart disease, unspecified: Secondary | ICD-10-CM

## 2023-07-05 DIAGNOSIS — I5043 Acute on chronic combined systolic (congestive) and diastolic (congestive) heart failure: Secondary | ICD-10-CM | POA: Diagnosis not present

## 2023-07-05 DIAGNOSIS — I129 Hypertensive chronic kidney disease with stage 1 through stage 4 chronic kidney disease, or unspecified chronic kidney disease: Secondary | ICD-10-CM | POA: Diagnosis not present

## 2023-07-05 DIAGNOSIS — Z952 Presence of prosthetic heart valve: Secondary | ICD-10-CM

## 2023-07-05 DIAGNOSIS — Z7901 Long term (current) use of anticoagulants: Secondary | ICD-10-CM

## 2023-07-05 DIAGNOSIS — I4729 Other ventricular tachycardia: Secondary | ICD-10-CM

## 2023-07-05 LAB — BASIC METABOLIC PANEL WITH GFR
Anion gap: 8 (ref 5–15)
BUN: 20 mg/dL (ref 8–23)
CO2: 30 mmol/L (ref 22–32)
Calcium: 8.2 mg/dL — ABNORMAL LOW (ref 8.9–10.3)
Chloride: 99 mmol/L (ref 98–111)
Creatinine, Ser: 1.34 mg/dL — ABNORMAL HIGH (ref 0.61–1.24)
GFR, Estimated: 51 mL/min — ABNORMAL LOW
Glucose, Bld: 99 mg/dL (ref 70–99)
Potassium: 3.3 mmol/L — ABNORMAL LOW (ref 3.5–5.1)
Sodium: 137 mmol/L (ref 135–145)

## 2023-07-05 MED ORDER — POTASSIUM CHLORIDE CRYS ER 20 MEQ PO TBCR
40.0000 meq | EXTENDED_RELEASE_TABLET | Freq: Two times a day (BID) | ORAL | Status: DC
  Administered 2023-07-05 – 2023-07-06 (×3): 40 meq via ORAL
  Filled 2023-07-05 (×3): qty 2

## 2023-07-05 MED ORDER — COLCHICINE 0.6 MG PO TABS
0.6000 mg | ORAL_TABLET | Freq: Every day | ORAL | Status: DC
  Administered 2023-07-05 – 2023-07-06 (×2): 0.6 mg via ORAL
  Filled 2023-07-05 (×2): qty 1

## 2023-07-05 NOTE — Progress Notes (Signed)
PROGRESS NOTE    Justin [name]  WJX:[id number] DOB: Jul 23, 1934 DOA: 06/30/2023 PCP: Nelwyn Salisbury, MD    Brief Narrative:  89 year old with history of VT, sick sinus syndrome status post recent pacemaker placement, A-fib a flutter, aortic stenosis status post TAVR, CAD status post CABG, obesity, hypothyroidism and hypertension, chronic combined heart failure presented to the ER for progressive shortness of breath since he had pacemaker placed on October 4.  He has been more fatigued and dyspneic on exertion.  Also had leg swelling.  In the emergency room hemodynamically stable.  On room air.  Creatinine 1.14.  BNP 392.  Troponin 16.  Chest x-ray with bibasilar subsegmental atelectasis with minimal pleural effusion.  Patient was admitted for CHF exacerbation. Remains in the hospital on IV diuresis.  Subjective:  Patient was seen and examined.  Urine output 3.5 L last 24 hours.  Negative balance 2 L since admission. Breathing is better.  Leg swelling is persistent. Creatinine 1.34.  Will recheck tomorrow morning.  Assessment & Plan:   Acute on chronic combined heart failure: Previous history of type I diastolic dysfunction.  Echocardiogram today shows ejection fraction 45 to 50%. Presented with orthopnea, leg edema, cardiomegaly on x-ray.  Increasing shortness of breath. Patient is on IV diuretics and clinically responding.  Renal functions fluctuate.  Will need very close monitoring.  Continue IV Lasix with potassium replacement. Followed by cardiology. Patient is on digoxin, Imdur, metoprolol XL 100 mg, Farxiga, Aldactone.  Holding off on [place] because of fluctuating renal functions. Compression stockings.  Chronic medical issues including Persistent A-fib flutter, frequent PVCs, sick sinus syndrome status post pacemaker: Currently paced rhythm.  Stable. On amiodarone, Pradaxa, digoxin.  Electrolytes are adequate.  Will increase dose of potassium. Essential hypertension, stable on  nitrates and metoprolol. Hyperlipidemia, on atorvastatin 20 Coronary artery disease status post CABG, on aspirin as well as Pradaxa. Hypothyroidism, on levothyroxine 200 GERD, on Protonix Gout, on allopurinol     DVT prophylaxis:  dabigatran (PRADAXA) capsule 150 mg   Code Status: DNR Family Communication: None today. Disposition Plan: Status is: Inpatient Remains inpatient appropriate because: Significant fluid overload, on IV diuresis     Consultants:  Cardiology  Procedures:  Echocardiogram  Antimicrobials:  None     Objective: Vitals:   07/04/23 1657 07/04/23 2045 07/05/23 0500 07/05/23 0538  BP: 110/74 121/72  115/71  Pulse: 79 71  67  Resp: 14 18  18   Temp: 98.3 F (36.8 C) 98 F (36.7 C)  97.9 F (36.6 C)  TempSrc: Oral Oral  Oral  SpO2: 98% 98%  100%  Weight:   118.7 kg   Height:        Intake/Output Summary (Last 24 hours) at 07/05/2023 1326 Last data filed at 07/05/2023 0900 Gross per 24 hour  Intake 720 ml  Output 2475 ml  Net -1755 ml   Filed Weights   07/03/23 0945 07/04/23 0319 07/05/23 0500  Weight: 121.8 kg 120.9 kg 118.7 kg    Examination:  General exam: Calm and comfortable.  On room air.  Sitting in chair. Respiratory system: No added sounds.  On room air.   Cardiovascular system: S1 & S2 heard, RRR.  Unable to appreciate any JVD.  No murmurs, rubs, gallops or clicks.  1+ pedal edema bilateral.  Gastrointestinal system: Abdomen is nondistended, soft and nontender. No organomegaly or masses felt. Normal bowel sounds heard.  Obese and pendulous. Central nervous system: Alert and oriented. No focal neurological deficits.  Data Reviewed: I have personally reviewed following labs and imaging studies  CBC: Recent Labs  Lab 06/30/23 1321 07/01/23 0355  WBC 11.5* 11.9*  NEUTROABS 8.9*  --   HGB 12.5* 11.2*  HCT 41.6 36.9*  MCV 87.6 87.4  PLT 313 283   Basic Metabolic Panel: Recent Labs  Lab 06/30/23 1616 07/01/23 0355  07/02/23 0415 07/03/23 0426 07/04/23 0412 07/05/23 1128  NA  --  140 136 135 136 137  K  --  4.1 3.6 3.3* 3.5 3.3*  CL  --  106 101 98 99 99  CO2  --  24 27 26 27 30   GLUCOSE  --  125* 108* 102* 112* 99  BUN  --  14 16 20 20 20   CREATININE  --  1.30* 1.49* 1.23 1.29* 1.34*  CALCIUM  --  7.8* 7.9* 7.6* 7.6* 8.2*  MG 2.7*  --   --   --   --   --   PHOS 2.3* 3.3  --   --   --   --    GFR: Estimated Creatinine Clearance: 47.8 mL/min (A) (by C-G formula based on SCr of 1.34 mg/dL (H)). Liver Function Tests: Recent Labs  Lab 06/30/23 1321  AST 30  ALT 26  ALKPHOS 132*  BILITOT 1.1  PROT 7.0  ALBUMIN 2.7*   No results for input(s): "LIPASE", "AMYLASE" in the last 168 hours. No results for input(s): "AMMONIA" in the last 168 hours. Coagulation Profile: No results for input(s): "INR", "PROTIME" in the last 168 hours. Cardiac Enzymes: No results for input(s): "CKTOTAL", "CKMB", "CKMBINDEX", "TROPONINI" in the last 168 hours. BNP (last 3 results) Recent Labs    10/16/22 1259  PROBNP 2,293*   HbA1C: No results for input(s): "HGBA1C" in the last 72 hours. CBG: Recent Labs  Lab 07/01/23 0730 07/01/23 1113 07/01/23 1615  GLUCAP 124* 109* 124*   Lipid Profile: Recent Labs    07/03/23 0426  CHOL 74  HDL 25*  LDLCALC 30  TRIG 96  CHOLHDL 3.0   Thyroid Function Tests: No results for input(s): "TSH", "T4TOTAL", "FREET4", "T3FREE", "THYROIDAB" in the last 72 hours.  Anemia Panel: No results for input(s): "VITAMINB12", "FOLATE", "FERRITIN", "TIBC", "IRON", "RETICCTPCT" in the last 72 hours.  Sepsis Labs: No results for input(s): "PROCALCITON", "LATICACIDVEN" in the last 168 hours.  No results found for this or any previous visit (from the past 240 hour(s)).       Radiology Studies: No results found.      Scheduled Meds:  allopurinol  100 mg Oral Daily   amiodarone  200 mg Oral Daily   aspirin  81 mg Oral Daily   atorvastatin  20 mg Oral Daily    colchicine  0.6 mg Oral Daily   dabigatran  150 mg Oral Q12H   dapagliflozin propanediol  10 mg Oral QAC breakfast   furosemide  60 mg Intravenous BID   isosorbide mononitrate  30 mg Oral QHS   levothyroxine  200 mcg Oral Q0600   loratadine  10 mg Oral Daily   metoprolol succinate  100 mg Oral Daily   montelukast  10 mg Oral QHS   multivitamin with minerals  1 tablet Oral Daily   pantoprazole  40 mg Oral Daily   potassium chloride SA  20 mEq Oral BID   sertraline  50 mg Oral Daily   spironolactone  25 mg Oral Daily   Continuous Infusions:   LOS: 5 days    Time spent: 40  minutes    Dorcas Carrow, MD [facility]

## 2023-07-05 NOTE — Progress Notes (Signed)
Mobility Specialist - Progress Note  Pre-mobility: 84 bpm HR,  During mobility: 117 bpm HR Post-mobility:  103 bpm HR,    07/05/23 1438  Mobility  Activity Ambulated independently in hallway  Level of Assistance Independent  Assistive Device None  Distance Ambulated (ft) 350 ft  Range of Motion/Exercises Active  Activity Response Tolerated well  Mobility Referral Yes  $Mobility charge 1 Mobility  Mobility Specialist Start Time (ACUTE ONLY) 1426  Mobility Specialist Stop Time (ACUTE ONLY) 1438  Mobility Specialist Time Calculation (min) (ACUTE ONLY) 12 min   Pt was found on recliner chair and agreeable to ambulate. No complaints with session. At EOS returned to recliner chair with all needs met. Call bell in reach.  Billey Chang Mobility Specialist

## 2023-07-05 NOTE — Progress Notes (Signed)
Patient Name: Justin [name] Date of Encounter: 07/05/2023 [facility] Cardiologist: Verne Carrow, MD   Interval Summary  .    Sitting up in chair.  No chest pain.  Shortness of breath is improving - so far by 30-35% +LE swelling  No events over nights.   Vital Signs .    Vitals:   07/04/23 1657 07/04/23 2045 07/05/23 0500 07/05/23 0538  BP: 110/74 121/72  115/71  Pulse: 79 71  67  Resp: 14 18  18   Temp: 98.3 F (36.8 C) 98 F (36.7 C)  97.9 F (36.6 C)  TempSrc: Oral Oral  Oral  SpO2: 98% 98%  100%  Weight:   118.7 kg   Height:        Intake/Output Summary (Last 24 hours) at 07/05/2023 0732 Last data filed at 07/05/2023 0515 Gross per 24 hour  Intake 960 ml  Output 3425 ml  Net -2465 ml      07/05/2023    5:00 AM 07/04/2023    3:19 AM 07/03/2023    9:45 AM  Last 3 Weights  Weight (lbs) 261 lb 11 oz 266 lb 8 oz 268 lb 8 oz  Weight (kg) 118.7 kg 120.884 kg 121.791 kg      Telemetry/ECG    Rate controlled Afib w/ V-paced. No ectopy. - Personally Reviewed  CV Studies    Echocardiogram 07/01/2023  1. LV appears mildly reduced with hypokinesis of the mid to apical  inferoseptum and mid inferior segments. This could be related to  pacing/postop septum. There is significant respiratory variation in septal  movement (best seen on image 102) which can be   seen in pericardial constriction. Clinical correlation is recommended.  Left ventricular ejection fraction, by estimation, is 40 to 45%. The left  ventricle has mildly decreased function. The left ventricle demonstrates  regional wall motion abnormalities  (see scoring diagram/findings for description). Left ventricular diastolic  function could not be evaluated.   2. Right ventricular systolic function is moderately reduced. The right  ventricular size is moderately enlarged. There is normal pulmonary artery  systolic pressure. The estimated right ventricular systolic pressure is  29.3  mmHg.   3. Left atrial size was severely dilated.   4. Right atrial size was severely dilated.   5. The mitral valve is degenerative. Trivial mitral valve regurgitation.  No evidence of mitral stenosis. Moderate to severe mitral annular  calcification.   6. The aortic valve has been repaired/replaced. Aortic valve  regurgitation is not visualized. There is a 29 mm Edwards Sapien  prosthetic (TAVR) valve present in the aortic position. Procedure Date:  03/04/2017. Echo findings are consistent with normal  structure and function of the aortic valve prosthesis. Aortic valve area,  by VTI measures 2.83 cm. Aortic valve mean gradient measures 7.6 mmHg.  Aortic valve Vmax measures 1.86 m/s.   7. The inferior vena cava is dilated in size with <50% respiratory  variability, suggesting right atrial pressure of 15 mmHg.    Physical Exam .   GEN: No acute distress.   Neck: Difficult to assess JVD due to short neck stature Ear: Homero Fellers sign  Cardiac: sternotomy site is well healed, Irregularly irregular, variable S1 and S2 no m / r /g.ICD site well healed.  Respiratory: Clear to auscultation bilaterally with reduced air exchange at bases.  GI: soft, non tender, non distended.  MS: 1+ edema, seems to be more distal today (mid shin to ankle)  Patient Profile  YAQOUB [name] is a 89 y.o. male has hx of  HTN, HLD, severe aortic stenosis s/p TAVR, PAF, chronic diastolic and systolic CHF, CAD s/p CABG, syncope, junctional bradycardia, PVCs, NSVT and LBBB s/p ICD implantation  and admitted on 06/30/2023 for the evaluation of acute on chronic CHF  Assessment & Plan .     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  Improving since admission  Net IO Since Admission: -2,198.57 mL [07/05/23 0735]  Morning labs pending   Started back on Aldactone 07/04/23  Continue Farxiga 10 mg p.o. daily. Currently on Lasix 60 mg IV push twice daily- home dose torsemide 20mg  po qother day and 40mg  po qother day. Metolazone 2.5mg  Saturday.  Continue Imdur 30 mg p.o. daily Continue Toprol-XL 100 mg p.o. daily Based on morning labs - restart low dose Entresto and monitor BP  Compression stockings bilaterally  Currently on room air  Ambulate for now.   CKD Baseline seems to be approximately 1.2.   Creatinine on admission 1.14 Morning labs pending  Persistent atrial fibrillation/flutter Multiple attempts for DCCV but he reverts back. Underwent GEN change on 05/30/2023 and has been persistent since then.  He is rate controlled currently with heart rates between 67-79 bpm Currently on Pradaxa, amiodarone 200 mg daily, Toprol-XL 100 mg daily, digoxin 125 mcg every other day.   Likely on digoxin due to reduced RV dysfunction; however, at the current dose not sure if its truly improving RV function. Will d/c digoxin - given his hx of renal insufficiency and exposure to drug to drug interaction (also on Amiodarone and Pradaxa). Overall benefit ? As serum levels are subtherapeutic. Can re-consider as outpatient.   Hx of NSVT  Quiescent on amiodarone.  Will need long-term surveillance as outpatient.   CAD status post CABG Underwent right and left heart catheterization in 2018 that showed all grafts were patent.  Denies angina pectoris. Continue aspirin 81 mg p.o. daily. Continue atorvastatin 20 mg p.o. daily. Continue antianginal therapy: Toprol-XL and Imdur.   LDL 32 mg/dL on admission, at goal.    Aortic stenosis status post TAVR Stable.  Is aware of needing antibiotic prophylaxis prior to procedures.  Monitor for now   For questions or updates, please contact [facility] Please consult [email or web address] for contact info under   Plan of care d/w patient and nursing staff.     Signed, Tessa Lerner, DO, [facility]  [facility]  [address] [place], [place] [address] Pager: [phone] Office: [phone] 07/05/23 8:16 AM

## 2023-07-05 NOTE — Plan of Care (Signed)
  Problem: Education: Goal: Ability to demonstrate management of disease process will improve Outcome: Progressing Goal: Ability to verbalize understanding of medication therapies will improve Outcome: Progressing Goal: Individualized Educational Video(s) Outcome: Progressing   Problem: Activity: Goal: Capacity to carry out activities will improve Outcome: Progressing   Problem: Cardiac: Goal: Ability to achieve and maintain adequate cardiopulmonary perfusion will improve Outcome: Progressing   Problem: Education: Goal: Knowledge of General Education information will improve Description: Including pain rating scale, medication(s)/side effects and non-pharmacologic comfort measures Outcome: Progressing   Problem: Health Behavior/Discharge Planning: Goal: Ability to manage health-related needs will improve Outcome: Progressing   Problem: Clinical Measurements: Goal: Ability to maintain clinical measurements within normal limits will improve Outcome: Progressing Goal: Will remain free from infection Outcome: Progressing Goal: Diagnostic test results will improve Outcome: Progressing Goal: Respiratory complications will improve Outcome: Progressing Goal: Cardiovascular complication will be avoided Outcome: Progressing   Problem: Activity: Goal: Risk for activity intolerance will decrease Outcome: Progressing   Problem: Nutrition: Goal: Adequate nutrition will be maintained Outcome: Progressing   Problem: Coping: Goal: Level of anxiety will decrease Outcome: Progressing   Problem: Elimination: Goal: Will not experience complications related to bowel motility Outcome: Progressing Goal: Will not experience complications related to urinary retention Outcome: Progressing   Problem: Pain Management: Goal: General experience of comfort will improve Outcome: Progressing   Problem: Safety: Goal: Ability to remain free from injury will improve Outcome: Progressing    Problem: Skin Integrity: Goal: Risk for impaired skin integrity will decrease Outcome: Progressing

## 2023-07-06 DIAGNOSIS — I129 Hypertensive chronic kidney disease with stage 1 through stage 4 chronic kidney disease, or unspecified chronic kidney disease: Secondary | ICD-10-CM | POA: Diagnosis not present

## 2023-07-06 DIAGNOSIS — I5043 Acute on chronic combined systolic (congestive) and diastolic (congestive) heart failure: Secondary | ICD-10-CM | POA: Diagnosis not present

## 2023-07-06 DIAGNOSIS — I519 Heart disease, unspecified: Secondary | ICD-10-CM | POA: Diagnosis not present

## 2023-07-06 DIAGNOSIS — Z950 Presence of cardiac pacemaker: Secondary | ICD-10-CM | POA: Diagnosis not present

## 2023-07-06 LAB — BASIC METABOLIC PANEL
Anion gap: 10 (ref 5–15)
BUN: 20 mg/dL (ref 8–23)
CO2: 27 mmol/L (ref 22–32)
Calcium: 8.2 mg/dL — ABNORMAL LOW (ref 8.9–10.3)
Chloride: 99 mmol/L (ref 98–111)
Creatinine, Ser: 1.44 mg/dL — ABNORMAL HIGH (ref 0.61–1.24)
GFR, Estimated: 47 mL/min — ABNORMAL LOW (ref >60)
Glucose, Bld: 113 mg/dL — ABNORMAL HIGH (ref 70–99)
Potassium: 3.4 mmol/L — ABNORMAL LOW (ref 3.5–5.1)
Sodium: 136 mmol/L (ref 135–145)

## 2023-07-06 MED ORDER — TORSEMIDE 20 MG PO TABS
20.0000 mg | ORAL_TABLET | ORAL | Status: DC
  Administered 2023-07-06: 20 mg via ORAL
  Filled 2023-07-06: qty 1

## 2023-07-06 MED ORDER — LOSARTAN POTASSIUM 25 MG PO TABS: 12.5000 mg | ORAL_TABLET | Freq: Every day | ORAL | 11 refills | Status: DC

## 2023-07-06 MED ORDER — TORSEMIDE 20 MG PO TABS: 40.0000 mg | ORAL_TABLET | ORAL | Status: DC

## 2023-07-06 MED ORDER — METOLAZONE 2.5 MG PO TABS: 2.5000 mg | ORAL_TABLET | ORAL | Status: DC

## 2023-07-06 MED ORDER — SPIRONOLACTONE 25 MG PO TABS: 25.0000 mg | ORAL_TABLET | Freq: Every day | ORAL | 0 refills | Status: DC

## 2023-07-06 NOTE — Discharge Summary (Signed)
Physician Discharge Summary  Justin [name] ZOX:[id number] DOB: Feb 23, 1934 DOA: 06/30/2023  PCP: Nelwyn Salisbury, MD  Admit date: 06/30/2023 Discharge date: 07/06/2023  Admitted From: Home Disposition: Home  Recommendations for Outpatient Follow-up:  Follow up with PCP in 1-2 weeks Please obtain BMP/CBC in one week Cardiology clinic to schedule follow-up  Home Health: N/A Equipment/Devices: N/A  Discharge Condition: Stable CODE STATUS: DNR with limited intervention Diet recommendation: Low-salt diet  Discharge summary: 89 year old with history of VT, sick sinus syndrome status post recent pacemaker placement, A-fib a flutter, aortic stenosis status post TAVR, CAD status post CABG, obesity, hypothyroidism and hypertension, chronic combined heart failure presented to the ER for progressive shortness of breath since he had pacemaker placed on October 4.  He has been more fatigued and dyspneic on exertion.  Also had leg swelling.  In the emergency room hemodynamically stable.  On room air.  Creatinine 1.14.  BNP 392.  Troponin 16.  Chest x-ray with bibasilar subsegmental atelectasis with minimal pleural effusion.  Patient was admitted for CHF exacerbation. Patient was admitted to the hospital and treated with aggressive diuresis including IV diuresis.  He had -4 L fluid balance with clinical improvement.  Seen and followed by cardiology.  Patient with slight worsening of renal functions today.  Creatinine 1.44.  Euvolemia achieved.  Acute on chronic combined heart failure: Ejection fraction 45 to 50% on recent echocardiogram.  Presented with orthopnea leg edema and cardiomegaly on chest x-ray.  Improved clinically with IV diuresis. Plan: As per cardiology recommendation he is going home on Torsemide 40 mg/20 mg daily on alternate day. Losartan 12.5 mg daily to start 11/11, currently not tolerating Entresto. Patient already on Farxiga and Aldactone.  Already on metoprolol and  nitrates. Digoxin discontinued. Is therapeutic on Pradaxa. Rate controlled on amiodarone. Other chronic medical issues remained stable. Recommended compression stockings for both legs. Stable for discharge home.  Cardiology to schedule follow-up in 2 weeks.  Discharge Diagnoses:  Principal Problem:   Acute exacerbation of congestive heart failure (HCC) Active Problems:   History of transcatheter aortic valve replacement (TAVR)   Acute on chronic combined systolic and diastolic heart failure (HCC)   Right ventricular dysfunction   Biventricular cardiac pacemaker in situ   Benign hypertension with CKD (chronic kidney disease) stage III (HCC)   Persistent atrial fibrillation (HCC)   Long term (current) use of anticoagulants   Hx of CABG   Atherosclerosis of native coronary artery of native heart without angina pectoris    Discharge Instructions  Discharge Instructions     (HEART FAILURE PATIENTS) Call MD:  Anytime you have any of the following symptoms: 1) 3 pound weight gain in 24 hours or 5 pounds in 1 week 2) shortness of breath, with or without a dry hacking cough 3) swelling in the hands, feet or stomach 4) if you have to sleep on extra pillows at night in order to breathe.   Complete by: As directed    Diet - low sodium heart healthy   Complete by: As directed    Discharge instructions   Complete by: As directed    Start taking water pills and new medicine Losartan tomorrow 11/11   Increase activity slowly   Complete by: As directed       Allergies as of 07/06/2023       Reactions   Peanut-containing Drug Products Anaphylaxis   Sulfonamide Derivatives Anaphylaxis   Amlodipine Swelling, Other (See Comments)   Swelling in ankles  Eliquis [apixaban] Other (See Comments)   Back/hip pain   Lisinopril Cough   Xarelto [rivaroxaban] Other (See Comments)   Back/hip pain        Medication List     STOP taking these medications    digoxin 0.125 MG  tablet Commonly known as: Lanoxin   Entresto 97-103 MG Generic drug: sacubitril-valsartan   furosemide 80 MG tablet Commonly known as: LASIX       TAKE these medications    albuterol 108 (90 Base) MCG/ACT inhaler Commonly known as: VENTOLIN HFA Inhale 2 puffs into the lungs every 4 (four) hours as needed for wheezing or shortness of breath.   allopurinol 100 MG tablet Commonly known as: ZYLOPRIM Take 1 tablet (100 mg total) by mouth daily. What changed: when to take this   amiodarone 200 MG tablet Commonly known as: PACERONE Take 2 tablets by mouth twice daily x 2 weeks then decrease to 2 tablets by mouth daily x 2 weeks What changed:  how much to take how to take this when to take this additional instructions   aspirin 81 MG tablet Take 1 tablet (81 mg total) by mouth daily.   atorvastatin 20 MG tablet Commonly known as: LIPITOR TAKE ONE TABLET ONCE DAILY What changed: when to take this   benzonatate 200 MG capsule Commonly known as: TESSALON Take 1 capsule (200 mg total) by mouth 2 (two) times daily as needed for cough.   CAL MAG ZINC +D3 PO Take 2 tablets by mouth in the morning. 500 mg / 7 mg / 25 mcg   cetirizine 10 MG tablet Commonly known as: ZYRTEC Take 10 mg by mouth 2 (two) times daily.   colchicine 0.6 MG tablet Take 1 tablet (0.6 mg total) by mouth every 6 (six) hours as needed (gout).   dabigatran 150 MG Caps capsule Commonly known as: PRADAXA TAKE 1 CAPSULE BY MOUTH TWICE  DAILY What changed:  when to take this additional instructions   diphenhydrAMINE 25 MG tablet Commonly known as: BENADRYL Take 25 mg by mouth daily as needed for allergies.   Farxiga 10 MG Tabs tablet Generic drug: dapagliflozin propanediol Take 10 mg by mouth daily before breakfast.   fluorouracil 5 % cream Commonly known as: EFUDEX Apply 1 application  topically 2 (two) times daily as needed (for actinic keratosis, OR AS OTHERWISE INSTRUCTED).   isosorbide  mononitrate 30 MG 24 hr tablet Commonly known as: IMDUR Take 1 tablet (30 mg total) by mouth at bedtime. What changed: when to take this   ketotifen 0.025 % ophthalmic solution Commonly known as: ZADITOR Place 1 drop into both eyes daily as needed (for itching).   levothyroxine 200 MCG tablet Commonly known as: SYNTHROID Take 1 tablet (200 mcg total) by mouth daily. What changed: when to take this   losartan 25 MG tablet Commonly known as: Cozaar Take 0.5 tablets (12.5 mg total) by mouth daily.   methocarbamol 500 MG tablet Commonly known as: ROBAXIN Take 1 tablet (500 mg total) by mouth 4 (four) times daily. What changed:  when to take this reasons to take this   metoCLOPramide 10 MG tablet Commonly known as: REGLAN TAKE ONE TABLET BY MOUTH EVERY MORNING WITH BREAKFAST What changed: See the new instructions.   metolazone 2.5 MG tablet Commonly known as: ZAROXOLYN Take 1 tablet (2.5 mg total) by mouth once a week. Take 1 tablet every Saturday What changed:  when to take this additional instructions   metoprolol succinate 50 MG  24 hr tablet Commonly known as: TOPROL-XL Take 2 tablets (100 mg total) by mouth daily. Take with or immediately following a meal. What changed:  how much to take when to take this   montelukast 10 MG tablet Commonly known as: SINGULAIR TAKE ONE TABLET BY MOUTH ONCE DAILY IN THE EVENING What changed:  how much to take how to take this when to take this additional instructions   Multi-Vitamin Gummies Chew Chew 2 tablets by mouth See admin instructions. Chew 2 gummies by mouth in the morning What changed: Another medication with the same name was removed. Continue taking this medication, and follow the directions you see here.   omeprazole 20 MG capsule Commonly known as: PRILOSEC TAKE ONE CAPSULE TWICE DAILY   potassium chloride SA 20 MEQ tablet Commonly known as: KLOR-CON M Take 1 tablet (20 mEq total) by mouth 2 (two) times  daily.   sertraline 50 MG tablet Commonly known as: ZOLOFT Take 1 tablet (50 mg total) by mouth daily. What changed: when to take this   silver sulfADIAZINE 1 % cream Commonly known as: Silvadene Apply 1 Application topically 2 (two) times daily. What changed:  when to take this additional instructions   spironolactone 25 MG tablet Commonly known as: ALDACTONE Take 1 tablet (25 mg total) by mouth daily. Start taking on: July 07, 2023 What changed:  how much to take when to take this   torsemide 20 MG tablet Commonly known as: DEMADEX Take 2 tablets (40 mg total) every other day, alternating with 1 tablet (20 mg total) every other day. What changed:  how much to take how to take this when to take this additional instructions   triamcinolone cream 0.1 % Commonly known as: KENALOG Apply 1 application  topically daily as needed for dry skin (affected areas).   valACYclovir 500 MG tablet Commonly known as: VALTREX Take 500 mg by mouth 2 (two) times daily as needed (cold sores).        Allergies  Allergen Reactions   Peanut-Containing Drug Products Anaphylaxis   Sulfonamide Derivatives Anaphylaxis   Amlodipine Swelling and Other (See Comments)    Swelling in ankles   Eliquis [Apixaban] Other (See Comments)    Back/hip pain   Lisinopril Cough   Xarelto [Rivaroxaban] Other (See Comments)    Back/hip pain    Consultations: Cardiology   Procedures/Studies: ECHOCARDIOGRAM COMPLETE  Result Date: 07/01/2023    ECHOCARDIOGRAM REPORT   Patient Name:   DYAMI MAMONE Date of Exam: 07/01/2023 Medical Rec #:  [id number]        Height:       66.0 in Accession #:    [id number]       Weight:       270.4 lb Date of Birth:  [birth year]       BSA:          2.273 m Patient Age:    89 years         BP:           129/72 mmHg Patient Gender: M                HR:           85 bpm. Exam Location:  Inpatient Procedure: 2D Echo, Cardiac Doppler, Color Doppler and Intracardiac             Opacification Agent Indications:    Dyspnea  History:        Patient has prior history  of Echocardiogram examinations, most                 recent 11/12/2022. CHF, CAD, Prior CABG, Prior Cardiac Surgery,                 Pacemaker and Defibrillator, Arrythmias:Atrial Fibrillation;                 Risk Factors:Former Smoker.                 Aortic Valve: 29 mm Edwards Sapien prosthetic, stented (TAVR)                 valve is present in the aortic position. Procedure Date:                 03/04/2017.  Sonographer:    Karma Ganja Referring Phys: [id number] PROSPER M AMPONSAH  Sonographer Comments: Technically difficult study due to poor echo windows and patient is obese. IMPRESSIONS  1. LV appears mildly reduced with hypokinesis of the mid to apical inferoseptum and mid inferior segments. This could be related to pacing/postop septum. There is significant respiratory variation in septal movement (best seen on image 102) which can be  seen in pericardial constriction. Clinical correlation is recommended. Left ventricular ejection fraction, by estimation, is 40 to 45%. The left ventricle has mildly decreased function. The left ventricle demonstrates regional wall motion abnormalities (see scoring diagram/findings for description). Left ventricular diastolic function could not be evaluated.  2. Right ventricular systolic function is moderately reduced. The right ventricular size is moderately enlarged. There is normal pulmonary artery systolic pressure. The estimated right ventricular systolic pressure is 29.3 mmHg.  3. Left atrial size was severely dilated.  4. Right atrial size was severely dilated.  5. The mitral valve is degenerative. Trivial mitral valve regurgitation. No evidence of mitral stenosis. Moderate to severe mitral annular calcification.  6. The aortic valve has been repaired/replaced. Aortic valve regurgitation is not visualized. There is a 29 mm Edwards Sapien prosthetic (TAVR) valve present in the  aortic position. Procedure Date: 03/04/2017. Echo findings are consistent with normal structure and function of the aortic valve prosthesis. Aortic valve area, by VTI measures 2.83 cm. Aortic valve mean gradient measures 7.6 mmHg. Aortic valve Vmax measures 1.86 m/s.  7. The inferior vena cava is dilated in size with <50% respiratory variability, suggesting right atrial pressure of 15 mmHg. FINDINGS  Left Ventricle: LV appears mildly reduced with hypokinesis of the mid to apical inferoseptum and mid inferior segments. This could be related to pacing/postop septum. There is significant respiratory variation in septal movement (best seen on image 102)  which can be seen in pericardial constriction. Clinical correlation is recommended. Left ventricular ejection fraction, by estimation, is 40 to 45%. The left ventricle has mildly decreased function. The left ventricle demonstrates regional wall motion abnormalities. Definity contrast agent was given IV to delineate the left ventricular endocardial borders. The left ventricular internal cavity size was normal in size. There is no left ventricular hypertrophy. Abnormal (paradoxical) septal motion consistent with post-operative status. Left ventricular diastolic function could not be evaluated due to atrial fibrillation. Left ventricular diastolic function could not be evaluated.  LV Wall Scoring: The mid inferoseptal segment, apical septal segment, and mid inferior segment are hypokinetic. Right Ventricle: The right ventricular size is moderately enlarged. No increase in right ventricular wall thickness. Right ventricular systolic function is moderately reduced. There is normal pulmonary artery systolic pressure. The tricuspid regurgitant velocity is 1.89 m/s,  and with an assumed right atrial pressure of 15 mmHg, the estimated right ventricular systolic pressure is 29.3 mmHg. Left Atrium: Left atrial size was severely dilated. Right Atrium: Right atrial size was severely  dilated. Pericardium: There is no evidence of pericardial effusion. Mitral Valve: The mitral valve is degenerative in appearance. Moderate to severe mitral annular calcification. Trivial mitral valve regurgitation. No evidence of mitral valve stenosis. Tricuspid Valve: The tricuspid valve is grossly normal. Tricuspid valve regurgitation is trivial. No evidence of tricuspid stenosis. Aortic Valve: The aortic valve has been repaired/replaced. Aortic valve regurgitation is not visualized. Aortic valve mean gradient measures 7.6 mmHg. Aortic valve peak gradient measures 13.8 mmHg. Aortic valve area, by VTI measures 2.83 cm. There is a 29 mm Edwards Sapien prosthetic, stented (TAVR) valve present in the aortic position. Procedure Date: 03/04/2017. Echo findings are consistent with normal structure and function of the aortic valve prosthesis. Pulmonic Valve: The pulmonic valve was grossly normal. Pulmonic valve regurgitation is not visualized. No evidence of pulmonic stenosis. Aorta: The aortic root and ascending aorta are structurally normal, with no evidence of dilitation. Venous: The inferior vena cava is dilated in size with less than 50% respiratory variability, suggesting right atrial pressure of 15 mmHg. IAS/Shunts: The atrial septum is grossly normal. Additional Comments: A device lead is visualized in the right atrium and right ventricle.  LEFT VENTRICLE PLAX 2D LVIDd:         4.00 cm     Diastology LVIDs:         3.10 cm     LV e' medial:    5.40 cm/s LV PW:         1.20 cm     LV E/e' medial:  22.8 LV IVS:        1.10 cm     LV e' lateral:   7.65 cm/s LVOT diam:     2.70 cm     LV E/e' lateral: 16.1 LV SV:         88 LV SV Index:   39 LVOT Area:     5.73 cm  LV Volumes (MOD) LV vol d, MOD A2C: 63.9 ml LV vol d, MOD A4C: 80.5 ml LV vol s, MOD A2C: 44.3 ml LV vol s, MOD A4C: 36.9 ml LV SV MOD A2C:     19.6 ml LV SV MOD A4C:     80.5 ml LV SV MOD BP:      33.2 ml RIGHT VENTRICLE            IVC RV Basal diam:   4.90 cm    IVC diam: 3.00 cm RV S prime:     5.22 cm/s TAPSE (M-mode): 1.8 cm LEFT ATRIUM              Index        RIGHT ATRIUM           Index LA diam:        5.40 cm  2.38 cm/m   RA Area:     31.60 cm LA Vol (A2C):   87.2 ml  38.36 ml/m  RA Volume:   113.00 ml 49.71 ml/m LA Vol (A4C):   192.0 ml 84.46 ml/m LA Biplane Vol: 131.0 ml 57.63 ml/m  AORTIC VALVE AV Area (Vmax):    2.53 cm AV Area (Vmean):   2.41 cm AV Area (VTI):     2.83 cm AV Vmax:           185.60  cm/s AV Vmean:          128.400 cm/s AV VTI:            0.311 m AV Peak Grad:      13.8 mmHg AV Mean Grad:      7.6 mmHg LVOT Vmax:         81.97 cm/s LVOT Vmean:        54.000 cm/s LVOT VTI:          0.154 m LVOT/AV VTI ratio: 0.49  AORTA Ao Root diam: 3.30 cm Ao Asc diam:  3.60 cm MITRAL VALVE                TRICUSPID VALVE MV Area (PHT): 3.88 cm     TR Peak grad:   14.3 mmHg MV Decel Time: 196 msec     TR Vmax:        189.00 cm/s MV E velocity: 123.33 cm/s                             SHUNTS                             Systemic VTI:  0.15 m                             Systemic Diam: 2.70 cm Lennie Odor MD Electronically signed by Lennie Odor MD Signature Date/Time: 07/01/2023/12:17:51 PM    Final    DG Chest 2 View  Result Date: 06/30/2023 CLINICAL DATA:  Shortness of breath. EXAM: CHEST - 2 VIEW COMPARISON:  October 27, 2020. FINDINGS: Stable cardiomegaly. Sternotomy wires are noted. Left-sided defibrillator is unchanged. Minimal bibasilar subsegmental atelectasis is noted with minimal pleural effusions. Bony thorax is unremarkable. IMPRESSION: Minimal bibasilar subsegmental atelectasis with minimal pleural effusions. Electronically Signed   By: Lupita Raider M.D.   On: 06/30/2023 16:01   CUP PACEART INCLINIC DEVICE CHECK  Result Date: 06/14/2023 Wound check appointment. Steri-strips removed. Wound without redness or edema. Wound healing within normal limits.  There is 1 small open area at proximal end of incision.  No stitch  present, no signs of infection redness or drainage.  Wound care instructions given and to monitor if doesn't continue to heal, call our office. Normal device function. Thresholds, sensing, impedance consistent with previous measurements.  Patient bi-ventricularly pacing 72% of the time. Patient in persistent AF with elevated intrinsic Vrates.  Reviewed with A. Tillery, PA-C.  Also, of note, patient experiencing significant dizziness since implant.  Device function normal.  There is noted polypharmacy and confusion with medications.  Dr. Graciela Husbands to be primary point for cardiac med control.  Patient is to stop farxiga, entresto and spiranolactone.  Daugther present and brought all meds in today.  Due to significant number of bottles and confusion, determined best to have pharmacist make appt to review all meds with patient/daughter to streamline moving forward.   Daughter instructed to only go throgh Dr. Graciela Husbands for medication changes and advice. Referral placed to pharmacist for outreach.  Device programmed for appropriate safey margin for chronic leads. Histogram distribution appropriate for patient and level of activity. AT/AF 100%.  Patient educated about wound care and shockk plan. There are no arm mobility or lifting restrictions..  ROV in 3 months with implanting physician.Syliva Overman, RN  (Echo, Carotid, EGD, Colonoscopy, ERCP)  Subjective: Patient seen and examined in the morning rounds.  Daughter was on the phone.  Verified medications and discussed about discharge planning.  Patient denies any complaints.  He still has some swelling of the legs but much better.  Comfortable with plan to go home.   Discharge Exam: Vitals:   07/06/23 0409 07/06/23 0818  BP: 121/65 116/66  Pulse: 67 74  Resp:  16  Temp: 97.7 F (36.5 C) 97.6 F (36.4 C)  SpO2: 100% 99%   Vitals:   07/05/23 1631 07/05/23 2207 07/06/23 0409 07/06/23 0818  BP: 127/72 132/80 121/65 116/66  Pulse: 75 81 67 74  Resp: 16   16   Temp: (!) 97.5 F (36.4 C) (!) 97.5 F (36.4 C) 97.7 F (36.5 C) 97.6 F (36.4 C)  TempSrc: Oral Oral Oral Oral  SpO2: 99% 98% 100% 99%  Weight:      Height:        General: Pt is alert, awake, not in acute distress Cardiovascular: RRR, S1/S2 +, no rubs, no gallops Respiratory: CTA bilaterally, no wheezing, no rhonchi Abdominal: Soft, NT, ND, bowel sounds + Extremities: Trace bilateral pedal edema.  No cyanosis    The results of significant diagnostics from this hospitalization (including imaging, microbiology, ancillary and laboratory) are listed below for reference.     Microbiology: No results found for this or any previous visit (from the past 240 hour(s)).   Labs: BNP (last 3 results) Recent Labs    12/04/22 1503 06/30/23 1321  BNP 398.3* 392.9*   Basic Metabolic Panel: Recent Labs  Lab 06/30/23 1616 07/01/23 0355 07/02/23 0415 07/03/23 0426 07/04/23 0412 07/05/23 1128 07/06/23 0357  NA  --  140 136 135 136 137 136  K  --  4.1 3.6 3.3* 3.5 3.3* 3.4*  CL  --  106 101 98 99 99 99  CO2  --  24 27 26 27 30 27   GLUCOSE  --  125* 108* 102* 112* 99 113*  BUN  --  14 16 20 20 20 20   CREATININE  --  1.30* 1.49* 1.23 1.29* 1.34* 1.44*  CALCIUM  --  7.8* 7.9* 7.6* 7.6* 8.2* 8.2*  MG 2.7*  --   --   --   --   --   --   PHOS 2.3* 3.3  --   --   --   --   --    Liver Function Tests: Recent Labs  Lab 06/30/23 1321  AST 30  ALT 26  ALKPHOS 132*  BILITOT 1.1  PROT 7.0  ALBUMIN 2.7*   No results for input(s): "LIPASE", "AMYLASE" in the last 168 hours. No results for input(s): "AMMONIA" in the last 168 hours. CBC: Recent Labs  Lab 06/30/23 1321 07/01/23 0355  WBC 11.5* 11.9*  NEUTROABS 8.9*  --   HGB 12.5* 11.2*  HCT 41.6 36.9*  MCV 87.6 87.4  PLT 313 283   Cardiac Enzymes: No results for input(s): "CKTOTAL", "CKMB", "CKMBINDEX", "TROPONINI" in the last 168 hours. BNP: Invalid input(s): "POCBNP" CBG: Recent Labs  Lab 07/01/23 0730  07/01/23 1113 07/01/23 1615  GLUCAP 124* 109* 124*   D-Dimer No results for input(s): "DDIMER" in the last 72 hours. Hgb A1c No results for input(s): "HGBA1C" in the last 72 hours. Lipid Profile No results for input(s): "CHOL", "HDL", "LDLCALC", "TRIG", "CHOLHDL", "LDLDIRECT" in the last 72 hours. Thyroid function studies No results for input(s): "TSH", "T4TOTAL", "T3FREE", "THYROIDAB" in the last 72 hours.  Invalid input(s): "  FREET3" Anemia work up No results for input(s): "VITAMINB12", "FOLATE", "FERRITIN", "TIBC", "IRON", "RETICCTPCT" in the last 72 hours. Urinalysis    Component Value Date/Time   COLORURINE YELLOW 05/07/2023 1415   APPEARANCEUR CLEAR 05/07/2023 1415   LABSPEC 1.020 05/07/2023 1415   PHURINE 7.0 05/07/2023 1415   GLUCOSEU NEGATIVE 05/07/2023 1415   HGBUR NEGATIVE 05/07/2023 1415   HGBUR negative 09/05/2010 0810   BILIRUBINUR NEGATIVE 05/07/2023 1415   BILIRUBINUR NEG 03/20/2022 1121   KETONESUR NEGATIVE 05/07/2023 1415   PROTEINUR Negative 03/20/2022 1121   PROTEINUR NEGATIVE 05/12/2019 1028   UROBILINOGEN 0.2 05/07/2023 1415   NITRITE NEGATIVE 05/07/2023 1415   LEUKOCYTESUR NEGATIVE 05/07/2023 1415   Sepsis Labs Recent Labs  Lab 06/30/23 1321 07/01/23 0355  WBC 11.5* 11.9*   Microbiology No results found for this or any previous visit (from the past 240 hour(s)).   Time coordinating discharge: 28 minutes  SIGNED:   Dorcas Carrow, MD  [facility] 07/06/2023, 9:39 AM

## 2023-07-06 NOTE — Progress Notes (Signed)
Patient Name: Justin [name] Date of Encounter: 07/06/2023 [facility] Cardiologist: Verne Carrow, MD   Interval Summary  .    No chest pain.  Shortness of breath better by 90-95% Sitting upright eating breakfast.  On RA  Vital Signs .    Vitals:   07/05/23 1415 07/05/23 1631 07/05/23 2207 07/06/23 0409  BP: 125/72 127/72 132/80 121/65  Pulse: 71 75 81 67  Resp: 16 16    Temp: (!) 97.5 F (36.4 C) (!) 97.5 F (36.4 C) (!) 97.5 F (36.4 C) 97.7 F (36.5 C)  TempSrc: Oral Oral Oral Oral  SpO2: 99% 99% 98% 100%  Weight:      Height:        Intake/Output Summary (Last 24 hours) at 07/06/2023 0758 Last data filed at 07/06/2023 0409 Gross per 24 hour  Intake 720 ml  Output 2800 ml  Net -2080 ml   Net IO Since Admission: -4,278.57 mL [07/06/23 0758]      07/05/2023    5:00 AM 07/04/2023    3:19 AM 07/03/2023    9:45 AM  Last 3 Weights  Weight (lbs) 261 lb 11 oz 266 lb 8 oz 268 lb 8 oz  Weight (kg) 118.7 kg 120.884 kg 121.791 kg      Telemetry/ECG    Vpaced rhythm - Personally Reviewed  CV Studies    Echocardiogram 07/01/2023  1. LV appears mildly reduced with hypokinesis of the mid to apical  inferoseptum and mid inferior segments. This could be related to  pacing/postop septum. There is significant respiratory variation in septal  movement (best seen on image 102) which can be   seen in pericardial constriction. Clinical correlation is recommended.  Left ventricular ejection fraction, by estimation, is 40 to 45%. The left  ventricle has mildly decreased function. The left ventricle demonstrates  regional wall motion abnormalities  (see scoring diagram/findings for description). Left ventricular diastolic  function could not be evaluated.   2. Right ventricular systolic function is moderately reduced. The right  ventricular size is moderately enlarged. There is normal pulmonary artery  systolic pressure. The estimated right ventricular  systolic pressure is  29.3 mmHg.   3. Left atrial size was severely dilated.   4. Right atrial size was severely dilated.   5. The mitral valve is degenerative. Trivial mitral valve regurgitation.  No evidence of mitral stenosis. Moderate to severe mitral annular  calcification.   6. The aortic valve has been repaired/replaced. Aortic valve  regurgitation is not visualized. There is a 29 mm Edwards Sapien  prosthetic (TAVR) valve present in the aortic position. Procedure Date:  03/04/2017. Echo findings are consistent with normal  structure and function of the aortic valve prosthesis. Aortic valve area,  by VTI measures 2.83 cm. Aortic valve mean gradient measures 7.6 mmHg.  Aortic valve Vmax measures 1.86 m/s.   7. The inferior vena cava is dilated in size with <50% respiratory  variability, suggesting right atrial pressure of 15 mmHg.    Physical Exam .   GEN: No acute distress.   Neck: Difficult to assess JVD due to short neck stature Ear: Homero Fellers sign  Cardiac: sternotomy site is well healed, Irregularly irregular, variable S1 and S2 no m / r /g.ICD site well healed.  Respiratory: Clear to auscultation bilaterally, no w/r/r GI: soft, non tender, non distended.  MS: 1+ edema, seems to be more distal today (mid shin to ankle)  Patient Profile    BODI LUMBARD is  a 89 y.o. male has hx of  HTN, HLD, severe aortic stenosis s/p TAVR, PAF, chronic diastolic and systolic CHF, CAD s/p CABG, syncope, junctional bradycardia, PVCs, NSVT and LBBB s/p ICD implantation  and admitted on 06/30/2023 for the evaluation of acute on chronic CHF  Assessment & Plan .     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  Improving since admission  Net IO Since Admission: -4,278.57 mL [07/06/23 0758]  Morning labs reviewed   Started back on Aldactone 07/04/23  Continue Farxiga 10 mg p.o. daily. Will transition to oral diuretics today. D/C Lasix 60 mg IV push twice daily. Home dose torsemide 20mg  po qother day and 40mg  po qother day. Metolazone 2.5mg  po every Saturday.  Continue Imdur 30 mg p.o. daily Continue Toprol-XL 100 mg p.o. daily Given the recent dizziness and soft BP and now AKI due to diuresis - will hold of restarting Entresto at this time (to do as outpatient). Start low dose losartan 12.5mg  po qday starting tomorrow 07/07/2023. Compression stockings not available per RN - start TED hose for now bilaterally  Currently on room air  Ambulate for now.   CKD Baseline seems to be approximately 1.2.   Creatinine on admission 1.14 Morning Cr 1.44 Likely secondary to diuresis - changing him from IV to home dosing   Persistent atrial fibrillation/flutter Multiple attempts for DCCV but he reverts back. Underwent GEN change on 05/30/2023 and has been persistent since then.  He is rate controlled currently with heart rates between 67-79 bpm Currently on Pradaxa, amiodarone 200 mg daily, Toprol-XL 100 mg daily, digoxin 125 mcg every other day.   Likely on digoxin due to reduced RV dysfunction; however, at the current dose not sure if its truly improving RV function. Will d/c digoxin - given his hx of renal insufficiency and exposure to drug to drug interaction (also on Amiodarone and Pradaxa). Overall benefit ? As serum levels are subtherapeutic. Can re-consider as outpatient.   Hx of NSVT  Quiescent on amiodarone.  Will need long-term surveillance as outpatient.   CAD status post CABG Underwent right and left heart catheterization in 2018 that showed all grafts were patent.  Denies angina pectoris. Continue aspirin 81 mg p.o. daily. Continue atorvastatin 20 mg p.o. daily. Continue antianginal therapy: Toprol-XL and Imdur.   LDL 32 mg/dL on admission, at goal.    Aortic stenosis status post TAVR Stable.    Is aware of needing antibiotic prophylaxis prior to procedures.   Monitor for now   For questions or updates, please contact [facility] Please consult [email or web address] for contact info under   Plan of care d/w patient, attending, and  nursing staff.   Recommend outpatient follow up w/ either primary cardiology or HF clinic TOC visit in 14 days. Due to the weekend will make the appt on weekday - patient is asked to call the office if they dont reach out in 48 hrs post discharge.     Signed, Tessa Lerner, DO, [facility]  [facility]  [address] [place], [place] [address] Pager: [phone] Office: [phone] 07/06/23 7:58 AM

## 2023-07-07 ENCOUNTER — Other Ambulatory Visit: Payer: Self-pay | Admitting: *Deleted

## 2023-07-07 ENCOUNTER — Encounter: Payer: Self-pay | Admitting: Cardiovascular Disease

## 2023-07-07 ENCOUNTER — Ambulatory Visit: Payer: Medicare Other | Attending: Cardiovascular Disease | Admitting: Cardiovascular Disease

## 2023-07-07 ENCOUNTER — Other Ambulatory Visit: Payer: Self-pay | Admitting: Orthopedic Surgery

## 2023-07-07 ENCOUNTER — Telehealth: Payer: Self-pay

## 2023-07-07 VITALS — BP 114/78 | HR 86 | Ht 67.0 in | Wt 261.0 lb

## 2023-07-07 DIAGNOSIS — I10 Essential (primary) hypertension: Secondary | ICD-10-CM | POA: Diagnosis not present

## 2023-07-07 DIAGNOSIS — I2510 Atherosclerotic heart disease of native coronary artery without angina pectoris: Secondary | ICD-10-CM | POA: Diagnosis not present

## 2023-07-07 DIAGNOSIS — I5042 Chronic combined systolic (congestive) and diastolic (congestive) heart failure: Secondary | ICD-10-CM

## 2023-07-07 DIAGNOSIS — I350 Nonrheumatic aortic (valve) stenosis: Secondary | ICD-10-CM

## 2023-07-07 DIAGNOSIS — I428 Other cardiomyopathies: Secondary | ICD-10-CM | POA: Diagnosis not present

## 2023-07-07 DIAGNOSIS — I4819 Other persistent atrial fibrillation: Secondary | ICD-10-CM

## 2023-07-07 DIAGNOSIS — I255 Ischemic cardiomyopathy: Secondary | ICD-10-CM

## 2023-07-07 DIAGNOSIS — E7849 Other hyperlipidemia: Secondary | ICD-10-CM

## 2023-07-07 NOTE — Transitions of Care (Post Inpatient/ED Visit) (Signed)
07/07/2023  Name: Justin [name] MRN: [id number] DOB: March 27, 1934  Today's TOC FU Call Status: Today's TOC FU Call Status:: Successful TOC FU Call Completed TOC FU Call Complete Date: 07/07/23 Patient's Name and Date of Birth confirmed.  Transition Care Management Follow-up Telephone Call Date of Discharge: 07/06/23 Discharge Facility: [facility]) Type of Discharge: Inpatient Admission Primary Inpatient Discharge Diagnosis:: Acute exacerbation of congestive heart failure How have you been since you were released from the hospital?: Better (Patient reports that he is doing well. Denies chest pain or shortness of breath.  Reports slight leg swelling. States that he and his daughter are on the way to the cardiology office.) Any questions or concerns?: Yes Patient Questions/Concerns:: Daughter has a question about why Robaxin is now scheduled 4 times a day when it was as needed. Patient Questions/Concerns Addressed: Other: (daughter reports she will ask cardiolgy at this afternoons visit.)  Items Reviewed: Did you receive and understand the discharge instructions provided?: Yes Medications obtained,verified, and reconciled?: Yes (Medications Reviewed) Any new allergies since your discharge?: No Dietary orders reviewed?: Yes Type of Diet Ordered:: low salt, heart healthy Do you have support at home?: Yes People in Home: child(ren), adult Name of Support/Comfort Primary Source: daughter  Medications Reviewed Today: Medications Reviewed Today     Reviewed by Earlie Server, RN (Registered Nurse) on 07/07/23 at 1401  Med List Status: <None>   Medication Order Taking? Sig Documenting Provider Last Dose Status Informant  albuterol (VENTOLIN HFA) 108 (90 Base) MCG/ACT inhaler [id number] Yes Inhale 2 puffs into the lungs every 4 (four) hours as needed for wheezing or shortness of breath. Nelwyn Salisbury, MD Taking Active Family Member  allopurinol (ZYLOPRIM) 100 MG tablet [id number] Yes  Take 1 tablet (100 mg total) by mouth daily.  Patient taking differently: Take 100 mg by mouth daily with supper.   Philip Aspen, Limmie Patricia, MD Taking Active Family Member  amiodarone (PACERONE) 200 MG tablet [id number] Yes Take 2 tablets by mouth twice daily x 2 weeks then decrease to 2 tablets by mouth daily x 2 weeks  Patient taking differently: Take 200 mg by mouth daily.   Duke Salvia, MD Taking Active Family Member  aspirin 81 MG tablet [id number] Yes Take 1 tablet (81 mg total) by mouth daily. Magnant, Joycie Peek, PA-C Taking Active Family Member  atorvastatin (LIPITOR) 20 MG tablet [id number] Yes TAKE ONE TABLET ONCE DAILY  Patient taking differently: Take 20 mg by mouth in the morning.   Philip Aspen, Limmie Patricia, MD Taking Active Family Member  benzonatate (TESSALON) 200 MG capsule [id number] No Take 1 capsule (200 mg total) by mouth 2 (two) times daily as needed for cough.  Patient not taking: Reported on 07/07/2023   Nelwyn Salisbury, MD Not Taking Active Family Member  cetirizine (ZYRTEC) 10 MG tablet [id number] Yes Take 10 mg by mouth 2 (two) times daily. [provider] Taking Active Family Member  colchicine 0.6 MG tablet [id number] No Take 1 tablet (0.6 mg total) by mouth every 6 (six) hours as needed (gout).  Patient not taking: Reported on 07/07/2023   Nelwyn Salisbury, MD Not Taking Active Family Member  dabigatran (PRADAXA) 150 MG CAPS capsule [id number] Yes TAKE 1 CAPSULE BY MOUTH TWICE  DAILY  Patient taking differently: Take 150 mg by mouth See admin instructions. Take 150 mg by mouth in the morning and with supper   Nelwyn Salisbury, MD Taking Active Family Member  dapagliflozin propanediol [facility])  10 MG TABS tablet [id number] Yes Take 10 mg by mouth daily before breakfast. [provider] Taking Active Family Member  diphenhydrAMINE (BENADRYL) 25 MG tablet [id number] No Take 25 mg by mouth daily as needed for allergies.  Patient not taking: Reported on  07/07/2023   [provider] Not Taking Active Family Member  fluorouracil (EFUDEX) 5 % cream [id number] No Apply 1 application  topically 2 (two) times daily as needed (for actinic keratosis, OR AS OTHERWISE INSTRUCTED).  Patient not taking: Reported on 07/07/2023   [provider] Not Taking Active Family Member  isosorbide mononitrate (IMDUR) 30 MG 24 hr tablet [id number] Yes Take 1 tablet (30 mg total) by mouth at bedtime.  Patient taking differently: Take 30 mg by mouth daily with supper.   Duke Salvia, MD Taking Active Family Member  ketotifen (ZADITOR) 0.025 % ophthalmic solution [id number] No Place 1 drop into both eyes daily as needed (for itching).  Patient not taking: Reported on 07/07/2023   [provider] Not Taking Active Family Member  levothyroxine (SYNTHROID) 200 MCG tablet [id number] Yes Take 1 tablet (200 mcg total) by mouth daily.  Patient taking differently: Take 200 mcg by mouth daily before breakfast.   Nelwyn Salisbury, MD Taking Active Family Member  losartan (COZAAR) 25 MG tablet [id number] Yes Take 0.5 tablets (12.5 mg total) by mouth daily. Dorcas Carrow, MD Taking Active   methocarbamol (ROBAXIN) 500 MG tablet [id number]  Take 1 tablet (500 mg total) by mouth 4 (four) times daily as needed for muscle spasms. London Sheer, MD  Active            Med Note (ROSE, Ralene Cork Jul 07, 2023  1:57 PM) Daughter has a question about the frequency of this medications. Prior to admission this medication was PRN  metoCLOPramide (REGLAN) 10 MG tablet [id number] Yes TAKE ONE TABLET BY MOUTH EVERY MORNING WITH BREAKFAST  Patient taking differently: Take 10 mg by mouth daily with breakfast.   Nelwyn Salisbury, MD Taking Active Family Member  metolazone (ZAROXOLYN) 2.5 MG tablet [id number] Yes Take 1 tablet (2.5 mg total) by mouth once a week. Take 1 tablet every Saturday  Patient taking differently: Take 2.5 mg by mouth every Saturday.   Graciella Freer, PA-C Taking Active Family Member  metoprolol succinate (TOPROL-XL) 50 MG 24 hr tablet [id number] Yes Take 2 tablets (100 mg total) by mouth daily. Take with or immediately following a meal.  Patient taking differently: Take 50 mg by mouth 2 (two) times daily. Take with or immediately following a meal.   Duke Salvia, MD Taking Active Family Member  montelukast (SINGULAIR) 10 MG tablet [id number] Yes TAKE ONE TABLET BY MOUTH ONCE DAILY IN THE EVENING  Patient taking differently: Take 10 mg by mouth daily with supper.   Hunsucker, Lesia Sago, MD Taking Active Family Member  Multiple Minerals-Vitamins (CAL MAG ZINC +D3 PO) [id number] Yes Take 2 tablets by mouth in the morning. 500 mg / 7 mg / 25 mcg [provider] Taking Active Family Member  Multiple Vitamins-Minerals (MULTI-VITAMIN GUMMIES) CHEW [id number] Yes Chew 2 tablets by mouth See admin instructions. Chew 2 gummies by mouth in the morning [provider] Taking Active Family Member  omeprazole (PRILOSEC) 20 MG capsule [id number] Yes TAKE ONE CAPSULE TWICE DAILY  Patient taking differently: Take 20 mg by mouth 2 (two) times daily.   Nelwyn Salisbury, MD Taking Active Family Member  potassium chloride  SA (KLOR-CON M) 20 MEQ tablet [id number] Yes Take 1 tablet (20 mEq total) by mouth 2 (two) times daily. Graciella Freer, PA-C Taking Active Family Member  sertraline (ZOLOFT) 50 MG tablet [id number] Yes Take 1 tablet (50 mg total) by mouth daily.  Patient taking differently: Take 50 mg by mouth daily with supper.   Nelwyn Salisbury, MD Taking Active Family Member  silver sulfADIAZINE (SILVADENE) 1 % cream [id number] Yes Apply 1 Application topically 2 (two) times daily.  Patient taking differently: Apply 1 Application topically See admin instructions. Apply to affected areas 2 times a day for wound care   Nelwyn Salisbury, MD Taking Active Family Member  spironolactone (ALDACTONE) 25 MG tablet [id number] Yes Take 1 tablet  (25 mg total) by mouth daily. Dorcas Carrow, MD Taking Active   torsemide (DEMADEX) 20 MG tablet [id number] Yes Take 2 tablets (40 mg total) every other day, alternating with 1 tablet (20 mg total) every other day.  Patient taking differently: Take 20-40 mg by mouth See admin instructions. Take 20 mg by mouth in the morning and with supper ALTERNATING WITH 20 mg only in the morning on the other days   Graciella Freer, PA-C Taking Active Family Member  triamcinolone cream (KENALOG) 0.1 % [id number] Yes Apply 1 application  topically daily as needed for dry skin (affected areas). [provider] Taking Active Family Member           Med Note Alphonzo Dublin   Fri Oct 09, 2018 12:40 PM)    valACYclovir (VALTREX) 500 MG tablet [id number] No Take 500 mg by mouth 2 (two) times daily as needed (cold sores).  Patient not taking: Reported on 07/07/2023   [provider] Not Taking Active Family Member            Home Care and Equipment/Supplies: Were Home Health Services Ordered?: NA Any new equipment or medical supplies ordered?: NA  Functional Questionnaire: Do you need assistance with bathing/showering or dressing?: No Do you need assistance with meal preparation?: No Do you need assistance with eating?: No Do you have difficulty maintaining continence: No Do you need assistance with getting out of bed/getting out of a chair/moving?: No Do you have difficulty managing or taking your medications?: No  Follow up appointments reviewed: PCP Follow-up appointment confirmed?: No (Encoruaged PCP follow up and daughter will call and make an appointment.) MD Provider Line Number:[phone] Given: No Specialist Hospital Follow-up appointment confirmed?: Yes Date of Specialist follow-up appointment?: 07/07/23 Follow-Up Specialty Provider:: cardiology Do you need transportation to your follow-up appointment?: No Do you understand care options if your condition(s)  worsen?: Yes-patient verbalized understanding  SDOH Interventions Today    Flowsheet Row Most Recent Value  SDOH Interventions   Food Insecurity Interventions Intervention Not Indicated  Housing Interventions Intervention Not Indicated  Transportation Interventions Intervention Not Indicated  Utilities Interventions Intervention Not Indicated     Spoke with patient and daughter as they are on their way to cardiology follow up.  Patient reports that he is doing well. Daughter bought a talking scale but it did not work this am.  Patient reports that he has some slight swelling in his legs.  Daughter manages medications. Patient is pending move in at [facility].    Reviewed patients need for labs in 1 week according to discharge note. Reviewed importance of low salt diet and daily weights. Encouraged patient to take his medications as prescribed.   Offered 30 day TOC program and  patient declined. Provided my contact information for patient to call me if needed.    Lonia Chimera, RN, BSN, CEN [employer]- Transition of Care Team.  [facility] [phone]

## 2023-07-07 NOTE — Progress Notes (Signed)
Chief Complaint  Patient presents with   Follow-up    Chronic systolic CHF   History of Present Illness: 89 yo male with history of HTN, HLD, severe aortic stenosis s/p TAVR, PAF, chronic diastolic and systolic CHF, CAD s/p 4V CABG, syncope, junctional bradycardia, PVCs, non-sustained VT and LBBB s/p ICD implantation who is here today for cardiac follow up. He is known to have CAD s/p 4V CABG in March 2007 (LIMA to LAD, SVG to Diagonal, SVG to OM, SVG to RCA). Last cardiac cath in January 2018 with 4/4 patent bypass grafts. He had progression of his aortic stenosis with worsened dyspnea and acute on chronic CHF in the spring of 2018. Echo May 2018 with mildly reduced LV systolic function, LVEF=40-45%. His aortic stenosis was severe. He underwent TAVR on 03/04/17 with a 29 mm Edwards Sapien 3 valve from the right transfemoral approach.  He developed atrial fibrillation following his procedure and was placed on IV amiodarone with conversion to sinus rhythm. Follow up visit in our office 03/19/17 and pt noted to have irregular rhythm with intraventricular conduction delay and was felt by our EP team to represent 2:1 conduction of competing fast and slow pathways. Cardiac monitor with sinus rhythm with periods of junctional rhythm, frequent PVCs, frequent PACs and runs of a wide complex tachycardia. ICD was implanted on 05/03/17. Echo September 2020 with LVEF=35-40%. Carotid artery dopplers March 2021 with mld bilateral carotid artery disease. He had trouble with fluid retention and we increased his Lasix and added metolazone 2 days per week. Device interrogation with persistent atrial fibrillation in August 2022 leading to a cardioversion. Echo January 2023 with LVEF=40-45%.  He converted back to atrial fib and was cardioverted to sinus March 2023. He had recurrent atrial flutter in April 2023, was loaded with amiodarone and was cardioverted again in June 2023. He converted back to atrial fib in July 2023 and  was paced out of it in the office. He had evidence of volume overload when seen by Dr. Graciela Husbands in March 2024 and Torsemide was increased. He was restated on amiodarone. Echo March 2024 LVEF=40-45%. AVR working well. He was seen in the Advanced Heart Failure clinic 11/25/22 by Dr. Gasper Lloyd and his volume status was felt to be ok. Continued on daily Torsemide 40 mg and metolazone 2.5 mg twice weekly. He was started on [place] and continued on Toprol, Imdur and aldactone. ICD generator change 05/30/23. He was admitted to [facility] with volume overload on 06/30/23. He was diuresed 4 liters of fluid with clinical improvement. Echo with LVEF=40-45%.   He is here today for follow up. The patient denies any chest pain, dyspnea, palpitations, lower extremity edema, orthopnea, PND, dizziness, near syncope or syncope. He is feeling much better since discharge yesterday.     Primary Care Physician: Nelwyn Salisbury, MD  Past Medical History:  Diagnosis Date   Age-related macular degeneration, dry, both eyes    Allergic    "24/7; 365 days/year; I'm allergic to pollens, dust, all southern grasses/trees, mold, mildue, cats, dogs" (01/07/2017)   Anal fissure    Asthma    sees Dr. Kendrick Fries    Benign prostatic hypertrophy    (sees Dr. Chester Holstein   CAD (coronary artery disease)    a. s/p CABG 2007. b. Cath 08/2016 - 4/4 patent grafts.   Carotid bruit    carotid u/s 10/10: 0.39% bilaterally   Chronic combined systolic and diastolic CHF (congestive heart failure) (HCC)  Complication of anesthesia 1980s   "w/anal cyst OR, he gave me a saddle block then put a narcotic in spinal cord; had a severe reaction to that" (01/07/2017)   Congestive heart failure (CHF) [facility])    ED (erectile dysfunction)    Family history of adverse reaction to anesthesia    "daughter wakes up during OR" (01/07/2017)   GERD (gastroesophageal reflux disease)    Gout    HTN (hypertension)    Hx of colonic polyps    (sees Dr.  Marina Goodell)   Hyperlipidemia    Hypothyroidism    Moderate to severe aortic stenosis    a. s/p TAVR 02/2017.   Myocardial infarction [facility]) ~ 2000   Obesity    Osteoarthritis    "was in my knees, hands" (01/07/2017 )   PAF (paroxysmal atrial fibrillation) (HCC)    a. documented post TAVR.   Precancerous skin lesion    (sees Dr. Terri Piedra)   Prostate cancer [facility]) dx'd ~ 2014   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   29 mm Edwards Sapien 3 transcatheter heart valve placed via percutaneous right transfemoral approach    Past Surgical History:  Procedure Laterality Date   ANUS SURGERY     "opened it back up cause it wouldn't heal; wound up w/a fissure" (01/07/2017)   BIV ICD INSERTION CRT-D N/A 05/02/2017   Procedure: BIV ICD INSERTION CRT-D;  Surgeon: Duke Salvia, MD;  Location: [facility] INVASIVE CV LAB;  Service: Cardiovascular;  Laterality: N/A;   CARDIAC CATHETERIZATION  10/29/2005   CARDIAC CATHETERIZATION N/A 08/27/2016   Procedure: Right/Left Heart Cath and Coronary/Graft Angiography;  Surgeon: Kathleene Hazel, MD;  Location: [facility] INVASIVE CV LAB;  Service: Cardiovascular;  Laterality: N/A;   CARDIOVERSION N/A 04/24/2021   Procedure: CARDIOVERSION;  Surgeon: Pricilla Riffle, MD;  Location: [facility] ENDOSCOPY;  Service: Cardiovascular;  Laterality: N/A;   CARDIOVERSION N/A 11/08/2021   Procedure: CARDIOVERSION;  Surgeon: Chrystie Nose, MD;  Location: [facility] ENDOSCOPY;  Service: Cardiovascular;  Laterality: N/A;   CARDIOVERSION N/A 02/06/2022   Procedure: CARDIOVERSION;  Surgeon: Wendall Stade, MD;  Location: [facility] ENDOSCOPY;  Service: Cardiovascular;  Laterality: N/A;   CATARACT EXTRACTION W/ INTRAOCULAR LENS  IMPLANT, BILATERAL Bilateral    CATARACT EXTRACTION, BILATERAL  2012   COLONOSCOPY  06/30/2008   no repeats needed    COLONOSCOPY     had 3 or 4 in the past    CORONARY ARTERY BYPASS GRAFT  2007   "CABG X4"   CYST EXCISION PERINEAL  1980s   HAMMER  TOE SURGERY Bilateral    ICD GENERATOR CHANGEOUT N/A 05/30/2023   Procedure: ICD GENERATOR CHANGEOUT;  Surgeon: Duke Salvia, MD;  Location: [facility] INVASIVE CV LAB;  Service: Cardiovascular;  Laterality: N/A;   JOINT REPLACEMENT     KNEE ARTHROPLASTY  07/30/2011   Procedure: COMPUTER ASSISTED TOTAL KNEE ARTHROPLASTY;  Surgeon: Cammy Copa;  Location: [facility] OR;  Service: Orthopedics;  Laterality: Left;  left total knee arthroplasty   MASTECTOMY SUBCUTANEOUS Bilateral    MULTIPLE TOOTH EXTRACTIONS     ORIF FINGER / THUMB FRACTURE Right ~ 1980   "repair of thumb injury"   PROSTATE BIOPSY     REPLACEMENT TOTAL KNEE BILATERAL Bilateral 2012   TEE WITHOUT CARDIOVERSION N/A 03/04/2017   Procedure: TRANSESOPHAGEAL ECHOCARDIOGRAM (TEE);  Surgeon: Kathleene Hazel, MD;  [facility] OR;  Service: Open Heart Surgery;  Laterality: N/A;   TOTAL KNEE  REVISION Right 05/18/2019   Procedure: RIGHT PATELLA REVISION/REMOVAL;  Surgeon: Cammy Copa, MD;  Location: [facility] OR;  Service: Orthopedics;  Laterality: Right;   TRANSCATHETER AORTIC VALVE REPLACEMENT, TRANSFEMORAL N/A 03/04/2017   Procedure: TRANSCATHETER AORTIC VALVE REPLACEMENT, TRANSFEMORAL;  Surgeon: Kathleene Hazel, MD;  Location: [facility] OR;  Service: Open Heart Surgery;  Laterality: N/A;    Current Outpatient Medications  Medication Sig Dispense Refill   albuterol (VENTOLIN HFA) 108 (90 Base) MCG/ACT inhaler Inhale 2 puffs into the lungs every 4 (four) hours as needed for wheezing or shortness of breath. 8 g 5   allopurinol (ZYLOPRIM) 100 MG tablet Take 1 tablet (100 mg total) by mouth daily. (Patient taking differently: Take 100 mg by mouth daily with supper.) 90 tablet 3   amiodarone (PACERONE) 200 MG tablet Take 2 tablets by mouth twice daily x 2 weeks then decrease to 2 tablets by mouth daily x 2 weeks (Patient taking differently: Take 200 mg by mouth daily.) 84 tablet 0   aspirin 81 MG tablet Take 1 tablet (81 mg total) by mouth  daily. 30 tablet 0   atorvastatin (LIPITOR) 20 MG tablet TAKE ONE TABLET ONCE DAILY (Patient taking differently: Take 20 mg by mouth in the morning.) 90 tablet 3   benzonatate (TESSALON) 200 MG capsule Take 1 capsule (200 mg total) by mouth 2 (two) times daily as needed for cough. 60 capsule 2   cetirizine (ZYRTEC) 10 MG tablet Take 10 mg by mouth 2 (two) times daily.     colchicine 0.6 MG tablet Take 1 tablet (0.6 mg total) by mouth every 6 (six) hours as needed (gout). 60 tablet 5   dabigatran (PRADAXA) 150 MG CAPS capsule TAKE 1 CAPSULE BY MOUTH TWICE  DAILY (Patient taking differently: Take 150 mg by mouth See admin instructions. Take 150 mg by mouth in the morning and with supper) 180 capsule 3   dapagliflozin propanediol (FARXIGA) 10 MG TABS tablet Take 10 mg by mouth daily before breakfast.     diphenhydrAMINE (BENADRYL) 25 MG tablet Take 25 mg by mouth daily as needed for allergies.     fluorouracil (EFUDEX) 5 % cream Apply 1 application  topically 2 (two) times daily as needed (for actinic keratosis, OR AS OTHERWISE INSTRUCTED).     isosorbide mononitrate (IMDUR) 30 MG 24 hr tablet Take 1 tablet (30 mg total) by mouth at bedtime. (Patient taking differently: Take 30 mg by mouth daily with supper.)     ketotifen (ZADITOR) 0.025 % ophthalmic solution Place 1 drop into both eyes daily as needed (for itching).     levothyroxine (SYNTHROID) 200 MCG tablet Take 1 tablet (200 mcg total) by mouth daily. (Patient taking differently: Take 200 mcg by mouth daily before breakfast.) 90 tablet 3   losartan (COZAAR) 25 MG tablet Take 0.5 tablets (12.5 mg total) by mouth daily. 30 tablet 11   methocarbamol (ROBAXIN) 500 MG tablet Take 1 tablet (500 mg total) by mouth 4 (four) times daily as needed for muscle spasms. 50 tablet 0   metoCLOPramide (REGLAN) 10 MG tablet TAKE ONE TABLET BY MOUTH EVERY MORNING WITH BREAKFAST (Patient taking differently: Take 10 mg by mouth daily with breakfast.) 30 tablet 3    metolazone (ZAROXOLYN) 2.5 MG tablet Take 1 tablet (2.5 mg total) by mouth once a week. Take 1 tablet every Saturday (Patient taking differently: Take 2.5 mg by mouth every Saturday.) 4 tablet 3   metoprolol succinate (TOPROL-XL) 50 MG 24 hr tablet Take 2  tablets (100 mg total) by mouth daily. Take with or immediately following a meal. (Patient taking differently: Take 50 mg by mouth 2 (two) times daily. Take with or immediately following a meal.) 180 tablet 2   montelukast (SINGULAIR) 10 MG tablet TAKE ONE TABLET BY MOUTH ONCE DAILY IN THE EVENING (Patient taking differently: Take 10 mg by mouth daily with supper.) 90 tablet 3   Multiple Minerals-Vitamins (CAL MAG ZINC +D3 PO) Take 2 tablets by mouth in the morning. 500 mg / 7 mg / 25 mcg     Multiple Vitamins-Minerals (MULTI-VITAMIN GUMMIES) CHEW Chew 2 tablets by mouth See admin instructions. Chew 2 gummies by mouth in the morning     omeprazole (PRILOSEC) 20 MG capsule TAKE ONE CAPSULE TWICE DAILY (Patient taking differently: Take 20 mg by mouth 2 (two) times daily.) 180 capsule 0   potassium chloride SA (KLOR-CON M) 20 MEQ tablet Take 1 tablet (20 mEq total) by mouth 2 (two) times daily. 180 tablet 3   sertraline (ZOLOFT) 50 MG tablet Take 1 tablet (50 mg total) by mouth daily. (Patient taking differently: Take 50 mg by mouth daily with supper.) 30 tablet 2   silver sulfADIAZINE (SILVADENE) 1 % cream Apply 1 Application topically 2 (two) times daily. (Patient taking differently: Apply 1 Application topically See admin instructions. Apply to affected areas 2 times a day for wound care) 50 g 5   spironolactone (ALDACTONE) 25 MG tablet Take 1 tablet (25 mg total) by mouth daily. 30 tablet 0   torsemide (DEMADEX) 20 MG tablet Take 2 tablets (40 mg total) every other day, alternating with 1 tablet (20 mg total) every other day. (Patient taking differently: Take 20-40 mg by mouth See admin instructions. Take 20 mg by mouth in the morning and with supper  ALTERNATING WITH 20 mg only in the morning on the other days) 180 tablet 3   triamcinolone cream (KENALOG) 0.1 % Apply 1 application  topically daily as needed for dry skin (affected areas).     valACYclovir (VALTREX) 500 MG tablet Take 500 mg by mouth 2 (two) times daily as needed (cold sores).     No current facility-administered medications for this visit.    Allergies  Allergen Reactions   Peanut-Containing Drug Products Anaphylaxis   Sulfonamide Derivatives Anaphylaxis   Amlodipine Swelling and Other (See Comments)    Swelling in ankles   Eliquis [Apixaban] Other (See Comments)    Back/hip pain   Lisinopril Cough   Xarelto [Rivaroxaban] Other (See Comments)    Back/hip pain    Social History   Socioeconomic History   Marital status: Married    Spouse name: Not on file   Number of children: 3   Years of education: Not on file   Highest education level: Not on file  Occupational History   Occupation: Product/process development scientist    Comment: builds malls  Tobacco Use   Smoking status: Former    Current packs/day: 0.00    Average packs/day: 3.5 packs/day for 13.0 years (45.5 ttl pk-yrs)    Types: Cigarettes    Start date: 47    Quit date: 1963    Years since quitting: 61.9   Smokeless tobacco: Never   Tobacco comments:    Former smoker 04/19/21  Vaping Use   Vaping status: Never Used  Substance and Sexual Activity   Alcohol use: Not Currently    Alcohol/week: 7.0 standard drinks of alcohol    Types: 7 Cans of beer per week  Comment: 1 beer daily after 5pm (04/19/21)   Drug use: No   Sexual activity: Never  Other Topics Concern   Not on file  Social History Narrative   FH of CAD, Male 1st degree relative less than age 42.   Social Determinants of Health   Financial Resource Strain: Low Risk  (03/31/2023)   Overall Financial Resource Strain (CARDIA)    Difficulty of Paying Living Expenses: Not very hard  Food Insecurity: No Food Insecurity (07/07/2023)   Hunger  Vital Sign    Worried About Running Out of Food in the Last Year: Never true    Ran Out of Food in the Last Year: Never true  Transportation Needs: No Transportation Needs (07/07/2023)   PRAPARE - Administrator, Civil Service (Medical): No    Lack of Transportation (Non-Medical): No  Physical Activity: Sufficiently Active (03/31/2023)   Exercise Vital Sign    Days of Exercise per Week: 7 days    Minutes of Exercise per Session: 30 min  Stress: No Stress Concern Present (03/31/2023)   [employer] of Occupational Health - Occupational Stress Questionnaire    Feeling of Stress : Not at all  Social Connections: Moderately Integrated (03/31/2023)   Social Connection and Isolation Panel [NHANES]    Frequency of Communication with Friends and Family: Three times a week    Frequency of Social Gatherings with Friends and Family: Three times a week    Attends Religious Services: Never    Active Member of Clubs or Organizations: Yes    Attends Banker Meetings: Never    Marital Status: Married  Catering manager Violence: Patient Unable To Answer (07/07/2023)   Humiliation, Afraid, Rape, and Kick questionnaire    Fear of Current or Ex-Partner: Patient unable to answer    Emotionally Abused: Patient unable to answer    Physically Abused: Patient unable to answer    Sexually Abused: Patient unable to answer    Family History  Problem Relation Age of Onset   Heart attack Father 76   Allergic rhinitis Father    Asthma Father    Heart failure Mother 76   Uterine cancer Mother    Breast cancer Mother    Colon cancer Neg Hx    Esophageal cancer Neg Hx     Review of Systems:  As stated in the HPI and otherwise negative.   BP 114/78   Pulse 86   Ht 5\' 7"  (1.702 m)   Wt 118.4 kg   SpO2 99%   BMI 40.88 kg/m   Physical Examination: General: Well developed, well nourished, NAD  HEENT: OP clear, mucus membranes moist  SKIN: warm, dry. No rashes. Neuro: No focal  deficits  Musculoskeletal: Muscle strength 5/5 all ext  Psychiatric: Mood and affect normal  Neck: No JVD, no carotid bruits, no thyromegaly, no lymphadenopathy.  Lungs:Clear bilaterally, no wheezes, rhonci, crackles Cardiovascular: Irregular. No murmurs, gallops or rubs. Abdomen:Soft. Bowel sounds present. Non-tender.  Extremities: Trace bilateral lower extremity edema.   EKG:  EKG is not ordered today The ekg ordered today demonstrates    Recent Labs: 10/16/2022: NT-Pro BNP 2,293 06/30/2023: ALT 26; B Natriuretic Peptide 392.9; Magnesium 2.7; TSH 9.303 07/01/2023: Hemoglobin 11.2; Platelets 283 07/06/2023: BUN 20; Creatinine, Ser 1.44; Potassium 3.4; Sodium 136   Lipid Panel    Component Value Date/Time   CHOL 74 07/03/2023 0426   CHOL 171 08/11/2018 0735   TRIG 96 07/03/2023 0426   TRIG 143 06/30/2006 1132  HDL 25 (L) 07/03/2023 0426   HDL 34 (L) 08/11/2018 0735   CHOLHDL 3.0 07/03/2023 0426   VLDL 19 07/03/2023 0426   LDLCALC 30 07/03/2023 0426   LDLCALC 68 03/21/2020 0943     Wt Readings from Last 3 Encounters:  07/07/23 118.4 kg  07/05/23 118.7 kg  05/30/23 117.5 kg    Assessment and Plan:   1. CAD s/p CABG without angina: No chest pain. Continue ASA, statin, Imdur and beta blocker.          2. Aortic valve stenosis: He is s/p TAVR in July 2018. AVR working well by echo this month. No PVL. He will continue SBE prophylaxis as indicated.    3. HTN: BP is controlled. No changees  4. HLD: LDL at goal in November 2024. Continue statin  5. Chronic systolic and diastolic CHF/Ischemic cardiomyopathy:  He is doing well following discharge yesterday. Continue torsemide 40 mg alternating with 20 mg every other day. Continue metolazone every Saturday. Continue Toprol, Losartan, Farxiga and aldactone. He will follow daily weights at home and use an extra 20 mg of Torsemide if needed for 2-3 pound weight gain. Follow up in the Advanced heart failure clinic in 3 months.   6.  Atrial fibrillation, persistent: Rate controlled today. Continue amiodarone, Toprol and Pradaxa.   7. Carotid artery disease:  Mild bilateral carotid artery disease by dopplers March 2021. Will not repeat dopplers given advanced age.   Labs/ tests ordered today include:   Orders Placed This Encounter  Procedures   Basic Metabolic Panel (BMET)   Disposition:  Follow up with me in 6 months.   Signed, Verne Carrow, MD 07/07/2023 3:31 PM    [facility] [address] [place], [place], [place]  [address] Phone: [phone]; Fax: [phone]

## 2023-07-07 NOTE — Patient Instructions (Signed)
Medication Instructions:  No changes *If you need a refill on your cardiac medications before your next appointment, please call your pharmacy*   Lab Work: Please return for blood work in about 7-10 days   Testing/Procedures: none   Follow-Up: At [facility], you and your health needs are our priority.  As part of our continuing mission to provide you with exceptional heart care, we have created designated Provider Care Teams.  These Care Teams include your primary Cardiologist (physician) and Advanced Practice Providers (APPs -  Physician Assistants and Nurse Practitioners) who all work together to provide you with the care you need, when you need it.   Your next appointment:   3 month(s)  Provider:   Advanced HF Clinic

## 2023-07-14 ENCOUNTER — Ambulatory Visit: Payer: Medicare Other | Attending: Internal Medicine

## 2023-07-14 DIAGNOSIS — Z95810 Presence of automatic (implantable) cardiac defibrillator: Secondary | ICD-10-CM | POA: Diagnosis not present

## 2023-07-14 DIAGNOSIS — I5042 Chronic combined systolic (congestive) and diastolic (congestive) heart failure: Secondary | ICD-10-CM | POA: Diagnosis not present

## 2023-07-15 NOTE — Progress Notes (Signed)
EPIC Encounter for ICM Monitoring  Patient Name: Justin [name] is a 89 y.o. male Date: 07/15/2023 Primary Care Physican: Nelwyn Salisbury, MD Primary Cardiologist: McAlhany/Sabharwal  Electrophysiologist: Joycelyn Schmid Pacing: 83.5%  01/08/2022 Weight: 260 lbs 06/17/2022 Weight: 272 lbs (baseline 269 lbs) 08/13/2022 Weight: 272 lbs 09/27/2022 Weight: 270 lbs 02/12/2023 Weight: 261 lbs 07/07/2023 Office Weight: 261 lbs  Time in AT/AF  24.0 hr/day (100.0%)   Transmission reviewed.       Diet:  Not strict with limiting salt.  Hospitalized 11/4-11/10.   Optivol thoracic impedance trending above baseline normal.     Prescribed: Torsemide 20 mg take 2 tablets (40 mg total) every other day alternating with 1 tablet (20 mg total) every other day.   Potassium 20 mEq take 1 tablet by mouth twice a day Metolazone 2.5 mg Take 1 tablet by mouth once a week on Saturday (per 7/15 phone note should take extra 40 mEq of Potassium with Metolazone). Spironolactone 25 mg take 1 tablet daily   Labs: 07/06/2023 Creatinine 1.44, BUN 20, Potassium 3.4, Sodium 136, GFR 47  07/05/2023 Creatinine 1.34, BUN 20, Potassium 3.3, Sodium 137, GFR 51  07/04/2023 Creatinine 1.29, BUN 20, Potassium 3.5, Sodium 136, GFR 54  07/03/2023 Creatinine 1.23, BUN 20, Potassium 3.3, Sodium 135, GFR 57 07/02/2023 Creatinine 1.49, BUN 16, Potassium 3.6, Sodium 136, GFR 45  07/01/2023 Creatinine 1.30, BUN 14, Potassium 4.1, Sodium 140, GFR 53  06/30/2023 Creatinine 1.14, BUN 14, Potassium 4.0, Sodium 140, GFR >60  A complete set of results can be found in Results Review.   Recommendations:   No changes.  Pt seen in office 11/11 after hospital discharge.    Follow-up plan: ICM clinic phone appointment on 08/25/2023.   91 day device clinic remote transmission 09/02/2023.     EP/Cardiology Office Visits: 09/01/2023 with Dr Graciela Husbands.    Copy of ICM check sent to Dr. Graciela Husbands.     3 month ICM trend: 07/14/2023.    12-14 Month ICM  trend:     Karie Soda, RN 07/15/2023 2:01 PM

## 2023-07-19 LAB — BASIC METABOLIC PANEL
BUN/Creatinine Ratio: 20 (ref 10–24)
BUN: 27 mg/dL (ref 8–27)
CO2: 25 mmol/L (ref 20–29)
Calcium: 8.4 mg/dL — ABNORMAL LOW (ref 8.6–10.2)
Chloride: 97 mmol/L (ref 96–106)
Creatinine, Ser: 1.38 mg/dL — ABNORMAL HIGH (ref 0.76–1.27)
Glucose: 90 mg/dL (ref 70–99)
Potassium: 3.8 mmol/L (ref 3.5–5.2)
Sodium: 140 mmol/L (ref 134–144)
eGFR: 49 mL/min/{1.73_m2} — ABNORMAL LOW (ref >59)

## 2023-07-28 DIAGNOSIS — J300 Vasomotor rhinitis: Secondary | ICD-10-CM | POA: Diagnosis not present

## 2023-07-28 DIAGNOSIS — J301 Allergic rhinitis due to pollen: Secondary | ICD-10-CM | POA: Diagnosis not present

## 2023-07-28 DIAGNOSIS — R052 Subacute cough: Secondary | ICD-10-CM | POA: Diagnosis not present

## 2023-07-28 DIAGNOSIS — J3089 Other allergic rhinitis: Secondary | ICD-10-CM | POA: Diagnosis not present

## 2023-07-29 ENCOUNTER — Ambulatory Visit: Payer: Medicare Other

## 2023-08-01 ENCOUNTER — Other Ambulatory Visit: Payer: Self-pay | Admitting: Family Medicine

## 2023-08-01 DIAGNOSIS — K219 Gastro-esophageal reflux disease without esophagitis: Secondary | ICD-10-CM

## 2023-08-01 NOTE — Telephone Encounter (Signed)
Pt is requesting refill on his Isosorbide ER 30 mg. Please advise

## 2023-08-04 DIAGNOSIS — Z91010 Allergy to peanuts: Secondary | ICD-10-CM | POA: Diagnosis not present

## 2023-08-04 DIAGNOSIS — J301 Allergic rhinitis due to pollen: Secondary | ICD-10-CM | POA: Diagnosis not present

## 2023-08-07 DIAGNOSIS — H353221 Exudative age-related macular degeneration, left eye, with active choroidal neovascularization: Secondary | ICD-10-CM | POA: Diagnosis not present

## 2023-08-21 ENCOUNTER — Ambulatory Visit: Payer: Medicare Other | Admitting: Podiatry

## 2023-08-26 ENCOUNTER — Ambulatory Visit: Payer: Medicare Other | Attending: Internal Medicine

## 2023-08-26 DIAGNOSIS — Z95810 Presence of automatic (implantable) cardiac defibrillator: Secondary | ICD-10-CM

## 2023-08-26 DIAGNOSIS — I5042 Chronic combined systolic (congestive) and diastolic (congestive) heart failure: Secondary | ICD-10-CM | POA: Diagnosis not present

## 2023-08-28 ENCOUNTER — Other Ambulatory Visit: Payer: Self-pay | Admitting: Family Medicine

## 2023-08-28 ENCOUNTER — Other Ambulatory Visit: Payer: Self-pay | Admitting: Pulmonary Disease

## 2023-08-29 ENCOUNTER — Encounter: Payer: Medicare Other | Admitting: Internal Medicine

## 2023-08-29 NOTE — Progress Notes (Signed)
 EPIC Encounter for ICM Monitoring  Patient Name: Justin [name] is a 89 y.o. male Date: 08/29/2023 Primary Care Physican: [name], MD Primary Cardiologist: McAlhany/Sabharwal  Electrophysiologist: Fernande Pore Pacing: 78.1%  01/08/2022 Weight: 260 lbs 06/17/2022 Weight: 272 lbs (baseline 269 lbs) 08/13/2022 Weight: 272 lbs 09/27/2022 Weight: 270 lbs 02/12/2023 Weight: 261 lbs 07/07/2023 Office Weight: 261 lbs   Time in AT/AF  24.0 hr/day (100.0%)   Transmission reviewed.       Diet:  Not strict with limiting salt.    Optivol thoracic impedance suggesting normal fluid levels.     Prescribed: Torsemide  20 mg take 2 tablets (40 mg total) every other day alternating with 1 tablet (20 mg total) every other day.   Potassium 20 mEq take 1 tablet by mouth twice a day Metolazone  2.5 mg Take 1 tablet by mouth once a week on Saturday (per 7/15 phone note should take extra 40 mEq of Potassium with Metolazone ). Spironolactone  25 mg take 1 tablet daily   Labs: 07/06/2023 Creatinine 1.44, BUN 20, Potassium 3.4, Sodium 136, GFR 47  07/05/2023 Creatinine 1.34, BUN 20, Potassium 3.3, Sodium 137, GFR 51  07/04/2023 Creatinine 1.29, BUN 20, Potassium 3.5, Sodium 136, GFR 54  07/03/2023 Creatinine 1.23, BUN 20, Potassium 3.3, Sodium 135, GFR 57 07/02/2023 Creatinine 1.49, BUN 16, Potassium 3.6, Sodium 136, GFR 45  07/01/2023 Creatinine 1.30, BUN 14, Potassium 4.1, Sodium 140, GFR 53  06/30/2023 Creatinine 1.14, BUN 14, Potassium 4.0, Sodium 140, GFR >60  A complete set of results can be found in Results Review.   Recommendations:   No changes.      Follow-up plan: ICM clinic phone appointment on 09/29/2023.   91 day device clinic remote transmission 12/02/2023.     EP/Cardiology Office Visits: 09/01/2023 with Dr Fernande.    Copy of ICM check sent to Dr. Fernande.  3 month ICM trend: 08/25/2023.    12-14 Month ICM trend:     [name], RN 08/29/2023 4:50 PM

## 2023-09-01 ENCOUNTER — Encounter: Payer: Medicare Other | Admitting: Internal Medicine

## 2023-09-01 DIAGNOSIS — I4821 Permanent atrial fibrillation: Secondary | ICD-10-CM | POA: Insufficient documentation

## 2023-09-02 ENCOUNTER — Ambulatory Visit: Payer: Medicare Other | Attending: Cardiology | Admitting: Student

## 2023-09-02 ENCOUNTER — Ambulatory Visit (INDEPENDENT_AMBULATORY_CARE_PROVIDER_SITE_OTHER): Payer: Medicare Other

## 2023-09-02 ENCOUNTER — Encounter: Payer: Self-pay | Admitting: Internal Medicine

## 2023-09-02 DIAGNOSIS — I5042 Chronic combined systolic (congestive) and diastolic (congestive) heart failure: Secondary | ICD-10-CM

## 2023-09-02 DIAGNOSIS — I428 Other cardiomyopathies: Secondary | ICD-10-CM

## 2023-09-02 DIAGNOSIS — I10 Essential (primary) hypertension: Secondary | ICD-10-CM

## 2023-09-02 DIAGNOSIS — I4819 Other persistent atrial fibrillation: Secondary | ICD-10-CM

## 2023-09-02 MED ORDER — ISOSORBIDE MONONITRATE ER 30 MG PO TB24
30.0000 mg | ORAL_TABLET | Freq: Every day | ORAL | 3 refills | Status: AC
Start: 2023-09-02 — End: 2024-12-22

## 2023-09-02 NOTE — Patient Instructions (Signed)
 Continue taking all meds as per our detail discussion.  Ask pharmacy for medications synchronization.

## 2023-09-02 NOTE — Progress Notes (Signed)
 Patient ID: FATIH STALVEY                 DOB: 08-Dec-1933                      MRN: [id number]      HPI: Justin [name] is a 89 y.o. male referred by Ozell Passey, PA to HTN clinic. PMH is significant for HTN, HLD, severe aortic stenosis s/p TAVR, PAF, chronic diastolic and systolic CHF, CAD s/p 4V CABG, syncope, junctional bradycardia, PVCs, non-sustained VT and LBBB s/p ICD implantation  Patient were hospitalized early November. polypharmacy  and meds confusion were reported by daughter. After recent hospital visit in early November, multiple medication was change so daughter wanted to create updated list so can set up the pill box correctly. Referred to PharmD.  Patient presented with his daughter today. They brought in all the medications. Went through all the medication and clarified indications and best time to take each of them. Patient denies swelling, chest pain, SOB, palpitation or dizziness. Living in senior living facility nurses checks BP and it ~115-120/70-80 range. He eats low alt diet and he no longer snacks on salty snacks. He stays active around the facilities.     Social History:  Alcohol: none  Smoking : none  Diet: low salt   Exercise: none but stays active around the living facility    Home BP readings: 115-120/70-80 recalls from memory    Wt Readings from Last 3 Encounters:  07/07/23 261 lb (118.4 kg)  07/05/23 261 lb 11 oz (118.7 kg)  05/30/23 259 lb (117.5 kg)   BP Readings from Last 3 Encounters:  07/07/23 114/78  07/06/23 116/66  05/30/23 (!) 108/57   Pulse Readings from Last 3 Encounters:  07/07/23 86  07/06/23 74  05/30/23 68    Renal function: CrCl cannot be calculated (Patient's most recent lab result is older than the maximum 21 days allowed.).  Past Medical History:  Diagnosis Date   Age-related macular degeneration, dry, both eyes    Allergic    24/7; 365 days/year; I'm allergic to pollens, dust, all southern grasses/trees,  mold, mildue, cats, dogs (01/07/2017)   Anal fissure    Asthma    sees Dr. Alaine    Benign prostatic hypertrophy    (sees Dr. Colan   CAD (coronary artery disease)    a. s/p CABG 2007. b. Cath 08/2016 - 4/4 patent grafts.   Carotid bruit    carotid u/s 10/10: 0.39% bilaterally   Chronic combined systolic and diastolic CHF (congestive heart failure) (HCC)    Complication of anesthesia 1980s   w/anal cyst OR, he gave me a saddle block then put a narcotic in spinal cord; had a severe reaction to that (01/07/2017)   Congestive heart failure (CHF) [facility])    ED (erectile dysfunction)    Family history of adverse reaction to anesthesia    daughter wakes up during OR (01/07/2017)   GERD (gastroesophageal reflux disease)    Gout    HTN (hypertension)    Hx of colonic polyps    (sees Dr. Abran)   Hyperlipidemia    Hypothyroidism    Moderate to severe aortic stenosis    a. s/p TAVR 02/2017.   Myocardial infarction [facility]) ~ 2000   Obesity    Osteoarthritis    was in my knees, hands (01/07/2017 )   PAF (paroxysmal atrial fibrillation) (HCC)    a. documented post TAVR.  Precancerous skin lesion    (sees Dr. 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 8 g 5   allopurinol  (ZYLOPRIM ) 100 MG tablet Take 1 tablet (100 mg total) by mouth daily. (Patient taking differently: Take 100 mg by mouth daily with supper.) 90 tablet 3   amiodarone  (PACERONE ) 200 MG tablet Take 2 tablets by mouth twice daily x 2 weeks then decrease to 2 tablets by mouth daily x 2 weeks  (Patient taking differently: Take 200 mg by mouth daily.) 84 tablet 0   aspirin  81 MG tablet Take 1 tablet (81 mg total) by mouth daily. 30 tablet 0   atorvastatin  (LIPITOR) 20 MG tablet TAKE ONE TABLET ONCE DAILY (Patient taking differently: Take 20 mg by mouth in the morning.) 90 tablet 3   benzonatate  (TESSALON ) 200 MG capsule Take 1 capsule (200 mg total) by mouth 2 (two) times daily as needed for cough. 60 capsule 2   cetirizine (ZYRTEC) 10 MG tablet Take 10 mg by mouth 2 (two) times daily.     colchicine  0.6 MG tablet Take 1 tablet (0.6 mg total) by mouth every 6 (six) hours as needed (gout). 60 tablet 5   dabigatran  (PRADAXA ) 150 MG CAPS capsule TAKE 1 CAPSULE BY MOUTH TWICE  DAILY (Patient taking differently: Take 150 mg by mouth See admin instructions. Take 150 mg by mouth in the morning and with supper) 180 capsule 3   dapagliflozin  propanediol (FARXIGA ) 10 MG TABS tablet Take 10 mg by mouth daily before breakfast.     diphenhydrAMINE  (BENADRYL ) 25 MG tablet Take 25 mg by mouth daily as needed for allergies.     fluorouracil  (EFUDEX ) 5 % cream Apply 1 application  topically 2 (two) times daily as needed (for actinic keratosis, OR AS OTHERWISE INSTRUCTED).     isosorbide  mononitrate (IMDUR ) 30 MG 24 hr tablet Take 1 tablet (30 mg total) by mouth at bedtime. (Patient taking differently: Take 30 mg by mouth daily with supper.)     ketotifen  (ZADITOR ) 0.025 % ophthalmic solution Place 1 drop into both eyes daily as needed (for itching).     levothyroxine  (SYNTHROID ) 200 MCG tablet Take 1 tablet (200 mcg total) by mouth daily. (Patient taking differently: Take 200 mcg by mouth daily before breakfast.) 90 tablet 3   losartan  (COZAAR ) 25 MG tablet Take 0.5 tablets (12.5 mg total) by mouth daily. 30 tablet 11   methocarbamol  (ROBAXIN ) 500 MG tablet Take 1 tablet (500 mg total) by mouth 4 (four) times daily as needed for muscle spasms. 50 tablet 0   metoCLOPramide  (REGLAN ) 10 MG tablet TAKE ONE  TABLET BY MOUTH EVERY MORNING WITH BREAKFAST 30 tablet 3   metolazone  (ZAROXOLYN ) 2.5 MG tablet Take 1 tablet (2.5 mg total) by mouth once a week. Take 1 tablet every Saturday (Patient taking differently: Take 2.5 mg by mouth every Saturday.) 4 tablet 3   metoprolol  succinate (TOPROL -XL) 50 MG 24 hr tablet Take 2 tablets (100 mg total) by mouth daily. Take with or immediately following a meal. (Patient taking differently:  Take 50 mg by mouth 2 (two) times daily. Take with or immediately following a meal.) 180 tablet 2   montelukast  (SINGULAIR ) 10 MG tablet TAKE ONE TABLET BY MOUTH ONCE DAILY IN THE EVENING (Patient taking differently: Take 10 mg by mouth daily with supper.) 90 tablet 3   Multiple Minerals-Vitamins (CAL MAG ZINC +D3 PO) Take 2 tablets by mouth in the morning. 500 mg / 7 mg / 25 mcg     Multiple Vitamins-Minerals (MULTI-VITAMIN GUMMIES) CHEW Chew 2 tablets by mouth See admin instructions. Chew 2 gummies by mouth in the morning     omeprazole  (PRILOSEC) 20 MG capsule TAKE ONE CAPSULE TWICE DAILY 180 capsule 0   potassium chloride  SA (KLOR-CON  M) 20 MEQ tablet Take 1 tablet (20 mEq total) by mouth 2 (two) times daily. 180 tablet 3   sertraline  (ZOLOFT ) 50 MG tablet Take 1 tablet (50 mg total) by mouth daily. 30 tablet 2   silver  sulfADIAZINE  (SILVADENE ) 1 % cream Apply 1 Application topically 2 (two) times daily. (Patient taking differently: Apply 1 Application topically See admin instructions. Apply to affected areas 2 times a day for wound care) 50 g 5   spironolactone  (ALDACTONE ) 25 MG tablet Take 1 tablet (25 mg total) by mouth daily. 30 tablet 0   torsemide  (DEMADEX ) 20 MG tablet Take 2 tablets (40 mg total) every other day, alternating with 1 tablet (20 mg total) every other day. (Patient taking differently: Take 20-40 mg by mouth See admin instructions. Take 20 mg by mouth in the morning and with supper ALTERNATING WITH 20 mg only in the morning on the other days) 180 tablet 3    triamcinolone  cream (KENALOG ) 0.1 % Apply 1 application  topically daily as needed for dry skin (affected areas).     valACYclovir  (VALTREX ) 500 MG tablet Take 500 mg by mouth 2 (two) times daily as needed (cold sores).     No current facility-administered medications on file prior to visit.    Allergies  Allergen Reactions   Peanut-Containing Drug Products Anaphylaxis   Sulfonamide Derivatives Anaphylaxis   Amlodipine  Swelling and Other (See Comments)    Swelling in ankles   Eliquis  [Apixaban ] Other (See Comments)    Back/hip pain   Lisinopril  Cough   Xarelto  [Rivaroxaban ] Other (See Comments)    Back/hip pain       Medication management  Patient and caretaker educated for each medication indication and best time to take them. Also  drug- drug- interaction were assessed. Patient wanted to know if he could get off of any medications. All heart medications are important and appropriate but long term use of PPI would be inappropriate. Long-term use of proton pump inhibitors (PPIs) in the elderly may lead to several potential disadvantages, including an increased risk of bone fractures, vitamin B12 deficiency, magnesium  deficiency, Clostridium difficile infection, community-acquired pneumonia, potential for kidney problems, and a possible association with dementia. Not knowing severity of GERD will defer to PCP to weigh risk vs benefits of long term PPI use Patient made aware of colchicine  and amiodarone  drug interaction. Reports he has not use colchicine  in long time he takes it PRN for gout flares. CrCl >60 ml/min as per recent BMP so Pradaxa  dose is appropriate denies any signs of bleeding.        Thank you  Robbi Blanch, Pharm.D [facility] of [facility] [address] [place], [place] [address]  Phone: [phone]; Fax: [phone]

## 2023-09-03 ENCOUNTER — Encounter: Payer: Self-pay | Admitting: Student

## 2023-09-03 LAB — CUP PACEART REMOTE DEVICE CHECK
Battery Remaining Longevity: 90 mo
Battery Voltage: 3 V
Brady Statistic RV Percent Paced: 77.29 %
Date Time Interrogation Session: 20250107210423
HighPow Impedance: 82 Ohm
Implantable Lead Connection Status: 753985
Implantable Lead Connection Status: 753985
Implantable Lead Connection Status: 753985
Implantable Lead Implant Date: 20180907
Implantable Lead Implant Date: 20180907
Implantable Lead Implant Date: 20180907
Implantable Lead Location: 753858
Implantable Lead Location: 753859
Implantable Lead Location: 753860
Implantable Lead Model: 4396
Implantable Lead Model: 5076
Implantable Pulse Generator Implant Date: 20241004
Lead Channel Impedance Value: 1235 Ohm
Lead Channel Impedance Value: 285 Ohm
Lead Channel Impedance Value: 361 Ohm
Lead Channel Impedance Value: 361 Ohm
Lead Channel Impedance Value: 608 Ohm
Lead Channel Impedance Value: 703 Ohm
Lead Channel Pacing Threshold Amplitude: 1 V
Lead Channel Pacing Threshold Amplitude: 2.75 V
Lead Channel Pacing Threshold Pulse Width: 0.4 ms
Lead Channel Pacing Threshold Pulse Width: 0.4 ms
Lead Channel Sensing Intrinsic Amplitude: 0.5 mV
Lead Channel Sensing Intrinsic Amplitude: 10.4 mV
Lead Channel Setting Pacing Amplitude: 1.75 V
Lead Channel Setting Pacing Amplitude: 2 V
Lead Channel Setting Pacing Amplitude: 3 V
Lead Channel Setting Pacing Pulse Width: 0.4 ms
Lead Channel Setting Pacing Pulse Width: 0.4 ms
Lead Channel Setting Sensing Sensitivity: 0.3 mV
Zone Setting Status: 755011
Zone Setting Status: 755011

## 2023-09-16 ENCOUNTER — Ambulatory Visit: Payer: Self-pay | Admitting: Family Medicine

## 2023-09-16 NOTE — Telephone Encounter (Signed)
2nd attempt to call patient. No answer. Left voicemail.   Copied from CRM [id number]. Topic: Clinical - Medical Advice >> Sep 16, 2023  3:15 PM Justin [name] C wrote: Reason for CRM: Medication for gout, and is having gout in his right wrist is requesting a prescription he will like a callback at [phone]

## 2023-09-16 NOTE — Telephone Encounter (Signed)
Attempt #3. Unable to reach pt. Left voicemail. Sending to PCP.

## 2023-09-16 NOTE — Telephone Encounter (Signed)
Copied from CRM [id number]. Topic: Clinical - Medical Advice >> Sep 16, 2023  3:15 PM Jon Gills C wrote: Reason for CRM: Medication for gout, and is having gout in his right wrist is requesting a prescription he will like a callback at [phone]   Called patient--No answer--Left a voicemail

## 2023-09-17 ENCOUNTER — Other Ambulatory Visit: Payer: Self-pay | Admitting: Family Medicine

## 2023-09-17 DIAGNOSIS — M109 Gout, unspecified: Secondary | ICD-10-CM

## 2023-09-18 MED ORDER — COLCHICINE 0.6 MG PO TABS: 0.6000 mg | ORAL_TABLET | Freq: Four times a day (QID) | ORAL | 5 refills | Status: DC | PRN

## 2023-09-18 NOTE — Telephone Encounter (Signed)
I sent in a refill for Colchicine

## 2023-09-18 NOTE — Telephone Encounter (Signed)
Spoke with pt daughter notified that refill for Colchicine was sent to the pharmacy

## 2023-09-18 NOTE — Addendum Note (Signed)
Addended by: Gershon Crane A on: 09/18/2023 08:01 AM   Modules accepted: Orders

## 2023-09-24 ENCOUNTER — Encounter: Payer: Self-pay | Admitting: Internal Medicine

## 2023-09-29 ENCOUNTER — Ambulatory Visit: Payer: Medicare Other | Attending: Internal Medicine

## 2023-09-29 DIAGNOSIS — Z95810 Presence of automatic (implantable) cardiac defibrillator: Secondary | ICD-10-CM | POA: Diagnosis not present

## 2023-09-29 DIAGNOSIS — I5042 Chronic combined systolic (congestive) and diastolic (congestive) heart failure: Secondary | ICD-10-CM

## 2023-09-30 ENCOUNTER — Telehealth: Payer: Self-pay

## 2023-09-30 NOTE — Telephone Encounter (Signed)
Remote ICM transmission received.  Attempted call to daughter per [facility] regarding ICM remote transmission and no answer.

## 2023-09-30 NOTE — Progress Notes (Signed)
EPIC Encounter for ICM Monitoring  Patient Name: Justin [name] is a 89 y.o. male Date: 09/30/2023 Primary Care Physican: Nelwyn Salisbury, MD Primary Cardiologist: McAlhany/Sabharwal  Electrophysiologist: Joycelyn Schmid Pacing: 75.6%  01/08/2022 Weight: 260 lbs 06/17/2022 Weight: 272 lbs (baseline 269 lbs) 08/13/2022 Weight: 272 lbs 09/27/2022 Weight: 270 lbs 02/12/2023 Weight: 261 lbs 07/07/2023 Office Weight: 261 lbs   Time in AT/AF  24.0 hr/day (100.0%)   Attempted call to daughter, Justin [name] per DPR and unable to reach.   Transmission results reviewed. Pt resides at [facility].   Optivol thoracic impedance suggesting possible fluid accumulation starting 1/9.   Prescribed: Torsemide 20 mg take 2 tablets (40 mg total) every other day alternating with 1 tablet (20 mg total) every other day.   Potassium 20 mEq take 1 tablet by mouth twice a day Metolazone 2.5 mg Take 1 tablet by mouth once a week on Saturday (per 7/15 phone note should take extra 40 mEq of Potassium with Metolazone). Spironolactone 25 mg take 1 tablet daily   Labs: 07/18/2023 Creatinine 1.38, BUN 27, Potassium 3.8, Sodium 140, GFR 49 07/06/2023 Creatinine 1.44, BUN 20, Potassium 3.4, Sodium 136, GFR 47  07/05/2023 Creatinine 1.34, BUN 20, Potassium 3.3, Sodium 137, GFR 51  07/04/2023 Creatinine 1.29, BUN 20, Potassium 3.5, Sodium 136, GFR 54  07/03/2023 Creatinine 1.23, BUN 20, Potassium 3.3, Sodium 135, GFR 57 07/02/2023 Creatinine 1.49, BUN 16, Potassium 3.6, Sodium 136, GFR 45  07/01/2023 Creatinine 1.30, BUN 14, Potassium 4.1, Sodium 140, GFR 53  06/30/2023 Creatinine 1.14, BUN 14, Potassium 4.0, Sodium 140, GFR >60  A complete set of results can be found in Results Review.   Recommendations:  Unable to reach.   Will send to Dr Gasper Lloyd for review if patient or daughter are reached.     Follow-up plan: ICM clinic phone appointment on 10/06/2023 to recheck fluid levels.   91 day device clinic remote  transmission 12/02/2023.     EP/Cardiology Office Visits: 10/24/2023 with Dr Graciela Husbands.   Recall 10/05/2023 with HF clinic   Copy of ICM check sent to Dr. Graciela Husbands.   3 month ICM trend: 09/29/2023.    12-14 Month ICM trend:     Karie Soda, RN 09/30/2023 12:39 PM

## 2023-10-06 ENCOUNTER — Ambulatory Visit: Payer: Medicare Other | Attending: Internal Medicine

## 2023-10-06 DIAGNOSIS — Z95810 Presence of automatic (implantable) cardiac defibrillator: Secondary | ICD-10-CM

## 2023-10-06 DIAGNOSIS — I5042 Chronic combined systolic (congestive) and diastolic (congestive) heart failure: Secondary | ICD-10-CM

## 2023-10-07 NOTE — Progress Notes (Signed)
 EPIC Encounter for ICM Monitoring  Patient Name: Justin [name] is a 89 y.o. male Date: 10/07/2023 Primary Care Physican: Nelwyn Salisbury, MD Primary Cardiologist: McAlhany/Sabharwal  Electrophysiologist: Joycelyn Schmid Pacing: 73.2%  01/08/2022 Weight: 260 lbs 06/17/2022 Weight: 272 lbs (baseline 269 lbs) 08/13/2022 Weight: 272 lbs 09/27/2022 Weight: 270 lbs 02/12/2023 Weight: 261 lbs 07/07/2023 Office Weight: 261 lbs 10/07/2023 Weight: 245-250 lbs   Time in AT/AF  24.0 hr/day (100.0%)   Spoke with daughter, Jareb Radoncic and heart failure questions reviewed.  Transmission results reviewed.  Pt reports feeling tired by has not reported any fluid symptoms.   He missed 2/8 Metolazone dosage.   Pt resides at [facility].   Optivol thoracic impedance suggesting possible fluid accumulation starting 1/9.   Prescribed: Torsemide 20 mg take 2 tablets (40 mg total) every other day alternating with 1 tablet (20 mg total) every other day.   Potassium 20 mEq take 1 tablet by mouth twice a day Metolazone 2.5 mg Take 1 tablet by mouth once a week on Saturday (per 7/15 phone note should take extra 40 mEq of Potassium with Metolazone). Spironolactone 25 mg take 1 tablet daily   Labs: 07/18/2023 Creatinine 1.38, BUN 27, Potassium 3.8, Sodium 140, GFR 49 07/06/2023 Creatinine 1.44, BUN 20, Potassium 3.4, Sodium 136, GFR 47  07/05/2023 Creatinine 1.34, BUN 20, Potassium 3.3, Sodium 137, GFR 51  07/04/2023 Creatinine 1.29, BUN 20, Potassium 3.5, Sodium 136, GFR 54  07/03/2023 Creatinine 1.23, BUN 20, Potassium 3.3, Sodium 135, GFR 57 07/02/2023 Creatinine 1.49, BUN 16, Potassium 3.6, Sodium 136, GFR 45  07/01/2023 Creatinine 1.30, BUN 14, Potassium 4.1, Sodium 140, GFR 53  06/30/2023 Creatinine 1.14, BUN 14, Potassium 4.0, Sodium 140, GFR >60  A complete set of results can be found in Results Review.   Recommendations:  Advised to take the missed 2/8 Metolazone dosage tomorrow, 2/12 and his  prescribed Saturday dosage as well.       Sent to Dr Gasper Lloyd for review and any further recommendations.     [facility] has [facility] appt on 2/13 and will evaluate if he is showing any fluid symptoms.     Follow-up plan: ICM clinic phone appointment on 10/13/2023 to recheck fluid levels.   91 day device clinic remote transmission 12/02/2023.     EP/Cardiology Office Visits: 10/24/2023 with Dr Graciela Husbands.   Recall 10/05/2023 with HF clinic.   Copy of ICM check sent to Dr. Graciela Husbands.   3 month ICM trend: 10/07/2023.    12-14 Month ICM trend:     Karie Soda, RN 10/07/2023 9:29 AM

## 2023-10-09 ENCOUNTER — Encounter: Payer: Self-pay | Admitting: Nurse Practitioner

## 2023-10-09 ENCOUNTER — Non-Acute Institutional Stay: Payer: Medicare Other | Admitting: Nurse Practitioner

## 2023-10-09 VITALS — BP 135/88 | HR 90 | Temp 96.9°F | Resp 18 | Ht 67.0 in | Wt 252.4 lb

## 2023-10-09 DIAGNOSIS — N1831 Chronic kidney disease, stage 3a: Secondary | ICD-10-CM | POA: Diagnosis not present

## 2023-10-09 DIAGNOSIS — K219 Gastro-esophageal reflux disease without esophagitis: Secondary | ICD-10-CM | POA: Diagnosis not present

## 2023-10-09 DIAGNOSIS — I4819 Other persistent atrial fibrillation: Secondary | ICD-10-CM | POA: Diagnosis not present

## 2023-10-09 DIAGNOSIS — I509 Heart failure, unspecified: Secondary | ICD-10-CM

## 2023-10-09 DIAGNOSIS — F329 Major depressive disorder, single episode, unspecified: Secondary | ICD-10-CM | POA: Insufficient documentation

## 2023-10-09 DIAGNOSIS — F339 Major depressive disorder, recurrent, unspecified: Secondary | ICD-10-CM

## 2023-10-09 DIAGNOSIS — I10 Essential (primary) hypertension: Secondary | ICD-10-CM | POA: Diagnosis not present

## 2023-10-09 DIAGNOSIS — H353221 Exudative age-related macular degeneration, left eye, with active choroidal neovascularization: Secondary | ICD-10-CM | POA: Diagnosis not present

## 2023-10-09 DIAGNOSIS — I25810 Atherosclerosis of coronary artery bypass graft(s) without angina pectoris: Secondary | ICD-10-CM | POA: Diagnosis not present

## 2023-10-09 DIAGNOSIS — M109 Gout, unspecified: Secondary | ICD-10-CM

## 2023-10-09 DIAGNOSIS — E039 Hypothyroidism, unspecified: Secondary | ICD-10-CM

## 2023-10-09 DIAGNOSIS — E785 Hyperlipidemia, unspecified: Secondary | ICD-10-CM | POA: Diagnosis not present

## 2023-10-09 NOTE — Assessment & Plan Note (Signed)
Bun/creat 27/1.38 07/18/23

## 2023-10-09 NOTE — Assessment & Plan Note (Signed)
blood pressure is controlled, on Losartan, Metoprolol, diuretics.

## 2023-10-09 NOTE — Assessment & Plan Note (Signed)
on ASA, Atorvastatin, LDL 30 07/03/23

## 2023-10-09 NOTE — Assessment & Plan Note (Signed)
on Levothyroxine, TSH 9.3 06/30/23, update TSH, CBC/diff, lipids, Vit B12, Vit D, Hgb A1c, CBC/diff, CMP/eGFR

## 2023-10-09 NOTE — Assessment & Plan Note (Signed)
started Sertraline 08/21/23, now 50mg  every day, feels sleeps too much and depressed.  Will update labs, may consider increase Sertraline.

## 2023-10-09 NOTE — Progress Notes (Signed)
Location:   Clinic [facility]   Place of Service:  Clinic (12) Provider: Arna Snipe Sherry Blackard NP  Breion Novacek X, NP  Patient Care Team: Zehava Turski X, NP as PCP - General (Internal Medicine) Kathleene Hazel, MD as PCP - Cardiology (Cardiology) Duke Salvia, MD as PCP - Electrophysiology (Cardiology) Jerilee Field, MD as Consulting Physician (Urology) Verner Chol, [facility] (Inactive) as Pharmacist (Pharmacist)  Extended Emergency Contact Information Primary Emergency Contact: Charlyne Quale Address: [address]          [place], [place] [address] [name] of [place] Home Phone: [phone] Mobile Phone: [phone] Relation: Daughter  Code Status: DNR Goals of care: Advanced Directive information    06/30/2023    9:11 PM  Advanced Directives  Does Patient Have a Medical Advance Directive? Yes  Type of Estate agent of [place];Living will  Does patient want to make changes to medical advance directive? No - Patient declined  Copy of Healthcare Power of Attorney in Chart? Yes - validated most recent copy scanned in chart (See row information)     Chief Complaint  Patient presents with   Establish Care     New patient     HPI:  Pt is a 89 y.o. male seen today for an acute visit for new patient establishment.   Gout, no flare up over a year, R+L wrist, only takes Allopurinol, uric acid 7.6 10/10/22  Afib, s/p pace maker, on Amiodarone, Pradaxa, Metoprolol  HLD on ASA, Atorvastatin, LDL 30 07/03/23  CAD, no chest pain, taking Isosorbide  CHF, mild edema BLE, RLE>LLE, on Farxiga, Metoprolol, Metolazone, Spironolactone, Torsemide  Hypothyroidism, on Levothyroxine, TSH 9.3 06/30/23  HTN, blood pressure is controlled, on Losartan, Metoprolol, diuretics.   GERD, stable, on Omeprazole, Hx of Reglan use  Depression, started Sertraline 08/21/23, now 50mg  every day, feels sleeps too much and depressed.   CKD Bun/creat 27/1.38 07/18/23   Past Medical  History:  Diagnosis Date   Age-related macular degeneration, dry, both eyes    Allergic    "24/7; 365 days/year; I'm allergic to pollens, dust, all southern grasses/trees, mold, mildue, cats, dogs" (01/07/2017)   Anal fissure    Asthma    sees Dr. Kendrick Fries    Benign prostatic hypertrophy    (sees Dr. Chester Holstein   CAD (coronary artery disease)    a. s/p CABG 2007. b. Cath 08/2016 - 4/4 patent grafts.   Carotid bruit    carotid u/s 10/10: 0.39% bilaterally   Chronic combined systolic and diastolic CHF (congestive heart failure) (HCC)    Complication of anesthesia 1980s   "w/anal cyst OR, he gave me a saddle block then put a narcotic in spinal cord; had a severe reaction to that" (01/07/2017)   Congestive heart failure (CHF) [facility])    ED (erectile dysfunction)    Family history of adverse reaction to anesthesia    "daughter wakes up during OR" (01/07/2017)   GERD (gastroesophageal reflux disease)    Gout    HTN (hypertension)    Hx of colonic polyps    (sees Dr. Marina Goodell)   Hyperlipidemia    Hypothyroidism    Moderate to severe aortic stenosis    a. s/p TAVR 02/2017.   Myocardial infarction [facility]) ~ 2000   Obesity    Osteoarthritis    "was in my knees, hands" (01/07/2017 )   PAF (paroxysmal atrial fibrillation) (HCC)    a. documented post TAVR.   Precancerous skin lesion    (sees Dr.  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   29 mm Edwards Sapien 3 transcatheter heart valve placed via percutaneous right transfemoral approach   Past Surgical History:  Procedure Laterality Date   ANUS SURGERY     "opened it back up cause it wouldn't heal; wound up w/a fissure" (01/07/2017)   BIV ICD INSERTION CRT-D N/A 05/02/2017   Procedure: BIV ICD INSERTION CRT-D;  Surgeon: Duke Salvia, MD;  Location: [facility] INVASIVE CV LAB;  Service: Cardiovascular;  Laterality: N/A;   CARDIAC CATHETERIZATION  10/29/2005   CARDIAC  CATHETERIZATION N/A 08/27/2016   Procedure: Right/Left Heart Cath and Coronary/Graft Angiography;  Surgeon: Kathleene Hazel, MD;  Location: [facility] INVASIVE CV LAB;  Service: Cardiovascular;  Laterality: N/A;   CARDIOVERSION N/A 04/24/2021   Procedure: CARDIOVERSION;  Surgeon: Pricilla Riffle, MD;  Location: [facility] ENDOSCOPY;  Service: Cardiovascular;  Laterality: N/A;   CARDIOVERSION N/A 11/08/2021   Procedure: CARDIOVERSION;  Surgeon: Chrystie Nose, MD;  Location: [facility] ENDOSCOPY;  Service: Cardiovascular;  Laterality: N/A;   CARDIOVERSION N/A 02/06/2022   Procedure: CARDIOVERSION;  Surgeon: Wendall Stade, MD;  Location: [facility] ENDOSCOPY;  Service: Cardiovascular;  Laterality: N/A;   CATARACT EXTRACTION W/ INTRAOCULAR LENS  IMPLANT, BILATERAL Bilateral    CATARACT EXTRACTION, BILATERAL  2012   COLONOSCOPY  06/30/2008   no repeats needed    COLONOSCOPY     had 3 or 4 in the past    CORONARY ARTERY BYPASS GRAFT  2007   "CABG X4"   CYST EXCISION PERINEAL  1980s   HAMMER TOE SURGERY Bilateral    ICD GENERATOR CHANGEOUT N/A 05/30/2023   Procedure: ICD GENERATOR CHANGEOUT;  Surgeon: Duke Salvia, MD;  Location: [facility] INVASIVE CV LAB;  Service: Cardiovascular;  Laterality: N/A;   JOINT REPLACEMENT     KNEE ARTHROPLASTY  07/30/2011   Procedure: COMPUTER ASSISTED TOTAL KNEE ARTHROPLASTY;  Surgeon: Cammy Copa;  Location: [facility] OR;  Service: Orthopedics;  Laterality: Left;  left total knee arthroplasty   MASTECTOMY SUBCUTANEOUS Bilateral    MULTIPLE TOOTH EXTRACTIONS     ORIF FINGER / THUMB FRACTURE Right ~ 1980   "repair of thumb injury"   PROSTATE BIOPSY     REPLACEMENT TOTAL KNEE BILATERAL Bilateral 2012   TEE WITHOUT CARDIOVERSION N/A 03/04/2017   Procedure: TRANSESOPHAGEAL ECHOCARDIOGRAM (TEE);  Surgeon: Kathleene Hazel, MD;  Location: [facility] OR;  Service: Open Heart Surgery;  Laterality: N/A;   TOTAL KNEE REVISION Right 05/18/2019   Procedure: RIGHT PATELLA REVISION/REMOVAL;  Surgeon: Cammy Copa, MD;  Location: [facility] OR;  Service: Orthopedics;  Laterality: Right;   TRANSCATHETER AORTIC VALVE REPLACEMENT, TRANSFEMORAL N/A 03/04/2017   Procedure: TRANSCATHETER AORTIC VALVE REPLACEMENT, TRANSFEMORAL;  Surgeon: Kathleene Hazel, MD;  Location: [facility] OR;  Service: Open Heart Surgery;  Laterality: N/A;    Allergies  Allergen Reactions   Peanut-Containing Drug Products Anaphylaxis   Sulfonamide Derivatives Anaphylaxis   Amlodipine Swelling and Other (See Comments)    Swelling in ankles   Eliquis [Apixaban] Other (See Comments)    Back/hip pain   Lisinopril Cough   Xarelto [Rivaroxaban] Other (See Comments)    Back/hip pain    Allergies as of 10/09/2023       Reactions   Peanut-containing Drug Products Anaphylaxis   Sulfonamide Derivatives Anaphylaxis   Amlodipine Swelling, Other (See Comments)   Swelling in ankles   Eliquis [  apixaban] Other (See Comments)   Back/hip pain   Lisinopril Cough   Xarelto [rivaroxaban] Other (See Comments)   Back/hip pain        Medication List        Accurate as of October 09, 2023  4:53 PM. If you have any questions, ask your nurse or doctor.          albuterol 108 (90 Base) MCG/ACT inhaler Commonly known as: VENTOLIN HFA Inhale 2 puffs into the lungs every 4 (four) hours as needed for wheezing or shortness of breath.   allopurinol 100 MG tablet Commonly known as: ZYLOPRIM Take 1 tablet (100 mg total) by mouth daily.   amiodarone 200 MG tablet Commonly known as: PACERONE Take 2 tablets by mouth twice daily x 2 weeks then decrease to 2 tablets by mouth daily x 2 weeks What changed:  how much to take how to take this when to take this additional instructions   aspirin 81 MG tablet Take 1 tablet (81 mg total) by mouth daily.   atorvastatin 20 MG tablet Commonly known as: LIPITOR TAKE ONE TABLET ONCE DAILY What changed: when to take this   benzonatate 200 MG capsule Commonly known as: TESSALON Take 1  capsule (200 mg total) by mouth 2 (two) times daily as needed for cough.   CAL MAG ZINC +D3 PO Take 2 tablets by mouth in the morning. 500 mg / 7 mg / 25 mcg   cetirizine 10 MG tablet Commonly known as: ZYRTEC Take 10 mg by mouth 2 (two) times daily.   colchicine 0.6 MG tablet Take 1 tablet (0.6 mg total) by mouth every 6 (six) hours as needed (gout).   dabigatran 150 MG Caps capsule Commonly known as: PRADAXA TAKE 1 CAPSULE BY MOUTH TWICE  DAILY What changed:  when to take this additional instructions   Farxiga 10 MG Tabs tablet Generic drug: dapagliflozin propanediol Take 10 mg by mouth daily before breakfast.   isosorbide mononitrate 30 MG 24 hr tablet Commonly known as: IMDUR Take 1 tablet (30 mg total) by mouth daily.   ketotifen 0.025 % ophthalmic solution Commonly known as: ZADITOR Place 1 drop into both eyes daily as needed (for itching).   levothyroxine 200 MCG tablet Commonly known as: SYNTHROID Take 1 tablet (200 mcg total) by mouth daily. What changed: when to take this   losartan 25 MG tablet Commonly known as: Cozaar Take 0.5 tablets (12.5 mg total) by mouth daily.   methocarbamol 500 MG tablet Commonly known as: ROBAXIN Take 1 tablet (500 mg total) by mouth 4 (four) times daily as needed for muscle spasms.   metoCLOPramide 10 MG tablet Commonly known as: REGLAN TAKE ONE TABLET BY MOUTH EVERY MORNING WITH BREAKFAST   metolazone 2.5 MG tablet Commonly known as: ZAROXOLYN Take 1 tablet (2.5 mg total) by mouth once a week. Take 1 tablet every Saturday What changed:  when to take this additional instructions   metoprolol succinate 50 MG 24 hr tablet Commonly known as: TOPROL-XL Take 2 tablets (100 mg total) by mouth daily. Take with or immediately following a meal. What changed:  how much to take when to take this   montelukast 10 MG tablet Commonly known as: SINGULAIR TAKE ONE TABLET BY MOUTH ONCE DAILY IN THE EVENING What changed:  how  much to take how to take this when to take this additional instructions   Multi-Vitamin Gummies Chew Chew 2 tablets by mouth See admin instructions. Chew 2 gummies by  mouth in the morning   PreserVision AREDS 2 Caps Take 1 capsule by mouth in the morning and at bedtime.   omeprazole 20 MG capsule Commonly known as: PRILOSEC TAKE ONE CAPSULE TWICE DAILY   potassium chloride SA 20 MEQ tablet Commonly known as: KLOR-CON M Take 1 tablet (20 mEq total) by mouth 2 (two) times daily.   sertraline 50 MG tablet Commonly known as: ZOLOFT Take 1 tablet (50 mg total) by mouth daily.   silver sulfADIAZINE 1 % cream Commonly known as: Silvadene Apply 1 Application topically 2 (two) times daily.   spironolactone 25 MG tablet Commonly known as: ALDACTONE Take 1 tablet (25 mg total) by mouth daily.   torsemide 20 MG tablet Commonly known as: DEMADEX Take 2 tablets (40 mg total) every other day, alternating with 1 tablet (20 mg total) every other day. What changed:  how much to take how to take this when to take this additional instructions   triamcinolone cream 0.1 % Commonly known as: KENALOG Apply 1 application  topically daily as needed for dry skin (affected areas).   valACYclovir 500 MG tablet Commonly known as: VALTREX Take 500 mg by mouth 2 (two) times daily as needed (cold sores).        Review of Systems  Constitutional:  Negative for appetite change, fatigue and fever.  HENT:  Negative for congestion and trouble swallowing.   Eyes:  Negative for visual disturbance.  Respiratory:  Negative for cough, shortness of breath and wheezing.   Cardiovascular:  Positive for leg swelling. Negative for chest pain and palpitations.  Gastrointestinal:  Negative for abdominal pain and constipation.  Genitourinary:  Positive for frequency. Negative for difficulty urinating and urgency.       Nocturnal urination about every 2-3 hours.   Musculoskeletal:  Positive for arthralgias  and gait problem.  Skin:  Negative for color change.  Neurological:  Negative for dizziness, speech difficulty and weakness.  Psychiatric/Behavioral:  Negative for confusion and sleep disturbance. The patient is not nervous/anxious.     Immunization History  Administered Date(s) Administered   Fluad Quad(high Dose 65+) 05/06/2019, 05/30/2020, 06/18/2021   Hep A / Hep B 04/06/2012, 10/07/2012   Hepatitis B 05/07/2012   Influenza Split 06/28/2013   Influenza Whole 06/05/2007, 05/13/2008, 06/06/2009, 05/30/2010   Influenza, High Dose Seasonal PF 05/15/2018, 05/31/2022   Influenza,inj,Quad PF,6+ Mos 05/20/2016   Influenza-Unspecified 05/25/2014, 07/08/2015, 05/06/2017   Moderna Covid-19 Vaccine Bivalent Booster 70yrs & up 05/31/2022   PFIZER(Purple Top)SARS-COV-2 Vaccination 09/28/2019, 10/12/2019, 05/30/2020   PPD Test 03/31/2023   Pfizer Covid-19 Vaccine Bivalent Booster 78yrs & up 11/18/2021   Pneumococcal Conjugate-13 09/18/2015   Pneumococcal Polysaccharide-23 05/13/2008, 05/25/2014   Td 05/30/2010   Tdap 10/27/2020   Zoster Recombinant(Shingrix) 05/18/2018, 10/16/2018   Zoster, Live 10/21/2007   Pertinent  Health Maintenance Due  Topic Date Due   INFLUENZA VACCINE  03/27/2023      02/06/2022   11:26 AM 03/25/2022   11:20 AM 10/10/2022   11:28 AM 03/31/2023   10:48 AM 10/09/2023    1:31 PM  Fall Risk  Falls in the past year?  0 1 0 0  Was there an injury with Fall?  0 1 0 0  Was there an injury with Fall? - Comments   Back pain    Fall Risk Category Calculator  0 3 0 0  Fall Risk Category (Retired)  Low     (RETIRED) Patient Fall Risk Level High fall risk Low fall risk  Patient at Risk for Falls Due to  Medication side effect No Fall Risks No Fall Risks History of fall(s)  Fall risk Follow up  Falls evaluation completed;Education provided;Falls prevention discussed Falls evaluation completed Falls evaluation completed Falls evaluation completed   Functional Status  Survey:    Vitals:   10/09/23 1323  BP: 135/88  Pulse: 90  Resp: 18  Temp: (!) 96.9 F (36.1 C)  SpO2: 97%  Weight: 252 lb 6.4 oz (114.5 kg)  Height: 5\' 7"  (1.702 m)   Body mass index is 39.53 kg/m. Physical Exam Vitals and nursing note reviewed.  Constitutional:      Appearance: He is obese.  HENT:     Head: Normocephalic and atraumatic.     Nose: Nose normal.  Eyes:     Extraocular Movements: Extraocular movements intact.     Conjunctiva/sclera: Conjunctivae normal.     Pupils: Pupils are equal, round, and reactive to light.  Cardiovascular:     Rate and Rhythm: Normal rate. Rhythm irregular.     Heart sounds: No murmur heard.    Comments: Left upper chest pace maker, defibrillator  Pulmonary:     Effort: Pulmonary effort is normal.     Breath sounds: No wheezing, rhonchi or rales.  Abdominal:     General: Bowel sounds are normal.     Palpations: Abdomen is soft.     Tenderness: There is no abdominal tenderness.  Musculoskeletal:     Cervical back: Normal range of motion and neck supple.     Right lower leg: Edema present.     Left lower leg: Edema present.     Comments: Mild edema BLE, R>L  Skin:    General: Skin is warm and dry.  Neurological:     General: No focal deficit present.     Mental Status: He is alert and oriented to person, place, and time. Mental status is at baseline.     Motor: No weakness.     Gait: Gait abnormal.  Psychiatric:        Mood and Affect: Mood normal.        Behavior: Behavior normal.        Thought Content: Thought content normal.        Judgment: Judgment normal.     Comments: Stated he feels difficulty adjusting to [facility]      Labs reviewed: Recent Labs    06/30/23 1616 07/01/23 0355 07/02/23 0415 07/05/23 1128 07/06/23 0357 07/18/23 1042  NA  --  140   < > 137 136 140  K  --  4.1   < > 3.3* 3.4* 3.8  CL  --  106   < > 99 99 97  CO2  --  24   < > 30 27 25   GLUCOSE  --  125*   < > 99 113* 90  BUN  --  14   < >  20 20 27   CREATININE  --  1.30*   < > 1.34* 1.44* 1.38*  CALCIUM  --  7.8*   < > 8.2* 8.2* 8.4*  MG 2.7*  --   --   --   --   --   PHOS 2.3* 3.3  --   --   --   --    < > = values in this interval not displayed.   Recent Labs    02/17/23 1231 04/18/23 1141 06/30/23 1321  AST 27 27 30   ALT 18 23 26  ALKPHOS 119 122* 132*  BILITOT 0.6 0.8 1.1  PROT 7.3 6.7 7.0  ALBUMIN 3.8 3.3* 2.7*   Recent Labs    10/10/22 1143 04/18/23 1141 05/23/23 1220 06/30/23 1321 07/01/23 0355  WBC 9.5   < > 16.0* 11.5* 11.9*  NEUTROABS 6.4  --   --  8.9*  --   HGB 13.2   < > 14.0 12.5* 11.2*  HCT 39.7   < > 45.2 41.6 36.9*  MCV 92.5   < > 87 87.6 87.4  PLT 228.0   < > 415 313 283   < > = values in this interval not displayed.   Lab Results  Component Value Date   TSH 9.303 (H) 06/30/2023   Lab Results  Component Value Date   HGBA1C 6.4 10/10/2022   Lab Results  Component Value Date   CHOL 74 07/03/2023   HDL 25 (L) 07/03/2023   LDLCALC 30 07/03/2023   TRIG 96 07/03/2023   CHOLHDL 3.0 07/03/2023    Significant Diagnostic Results in last 30 days:  No results found.  Assessment/Plan: Depression, recurrent (HCC)  started Sertraline 08/21/23, now 50mg  every day, feels sleeps too much and depressed.  Will update labs, may consider increase Sertraline.   GOUT no flare up over a year, R+L wrist, only takes Allopurinol, uric acid 7.6 10/10/22  Hypothyroidism  on Levothyroxine, TSH 9.3 06/30/23, update TSH, CBC/diff, lipids, Vit B12, Vit D, Hgb A1c, CBC/diff, CMP/eGFR  CKD (chronic kidney disease) stage 3, GFR 30-59 ml/min (HCC)  Bun/creat 27/1.38 07/18/23  Gastroesophageal reflux disease stable, on Omeprazole, Hx of Reglan use  Essential hypertension blood pressure is controlled, on Losartan, Metoprolol, diuretics.   Chronic congestive heart failure (HCC) mild edema BLE, RLE>LLE, on Farxiga, Metoprolol, Metolazone, Spironolactone, Torsemide, compensated clinically  CAD,  AUTOLOGOUS BYPASS GRAFT no chest pain, taking Isosorbide  Hyperlipidemia on ASA, Atorvastatin, LDL 30 07/03/23  Persistent atrial fibrillation (HCC) s/p pace maker, on Amiodarone, Pradaxa, Metoprolol    Family/ staff Communication: plan of care reviewed with the patient and the patient's HPOA daughter  Labs/tests ordered:  TSH, Vit B12, Vit D, CBC/diff, CMP/eGFR, lipids, Hgb A1c  F/u in clinic [facility] 2 weeks.

## 2023-10-09 NOTE — Assessment & Plan Note (Signed)
no flare up over a year, R+L wrist, only takes Allopurinol, uric acid 7.6 10/10/22

## 2023-10-09 NOTE — Assessment & Plan Note (Signed)
mild edema BLE, RLE>LLE, on Farxiga, Metoprolol, Metolazone, Spironolactone, Torsemide, compensated clinically

## 2023-10-09 NOTE — Assessment & Plan Note (Signed)
no chest pain, taking Isosorbide

## 2023-10-09 NOTE — Assessment & Plan Note (Signed)
s/p pace maker, on Amiodarone, Pradaxa, Metoprolol

## 2023-10-09 NOTE — Assessment & Plan Note (Signed)
stable, on Omeprazole, Hx of Reglan use

## 2023-10-10 NOTE — Progress Notes (Signed)
No Recommendations from Dr Gasper Lloyd.

## 2023-10-13 ENCOUNTER — Ambulatory Visit: Payer: Medicare Other | Attending: Internal Medicine

## 2023-10-13 ENCOUNTER — Other Ambulatory Visit: Payer: Self-pay

## 2023-10-13 DIAGNOSIS — I5042 Chronic combined systolic (congestive) and diastolic (congestive) heart failure: Secondary | ICD-10-CM

## 2023-10-13 DIAGNOSIS — Z95810 Presence of automatic (implantable) cardiac defibrillator: Secondary | ICD-10-CM

## 2023-10-13 DIAGNOSIS — N1831 Chronic kidney disease, stage 3a: Secondary | ICD-10-CM

## 2023-10-13 DIAGNOSIS — I4819 Other persistent atrial fibrillation: Secondary | ICD-10-CM

## 2023-10-13 DIAGNOSIS — F339 Major depressive disorder, recurrent, unspecified: Secondary | ICD-10-CM

## 2023-10-13 DIAGNOSIS — E039 Hypothyroidism, unspecified: Secondary | ICD-10-CM

## 2023-10-13 DIAGNOSIS — E785 Hyperlipidemia, unspecified: Secondary | ICD-10-CM

## 2023-10-13 NOTE — Progress Notes (Signed)
EPIC Encounter for ICM Monitoring  Patient Name: Justin [name] is a 89 y.o. male Date: 10/13/2023 Primary Care Physican: Mast, Man X, NP Primary Cardiologist: McAlhany/Sabharwal  Electrophysiologist: Joycelyn Schmid Pacing: 72.3%  09/27/2022 Weight: 270 lbs 02/12/2023 Weight: 261 lbs 07/07/2023 Office Weight: 261 lbs 10/07/2023 Weight: 245-250 lbs   Time in AT/AF  24.0 hr/day (100.0%)   Spoke with daughter, Justin [name] and heart failure questions reviewed.  Transmission results reviewed.  She reports no changes in condition.  He took the missed Metolazone dosage 2/13 and the usual prescribed dosage on 2/15 with good results.    Optivol thoracic impedance suggesting fluid returned to normal 2/17.   Prescribed: Torsemide 20 mg take 2 tablets (40 mg total) every other day alternating with 1 tablet (20 mg total) every other day.   Potassium 20 mEq take 1 tablet by mouth twice a day Metolazone 2.5 mg Take 1 tablet by mouth once a week on Saturday (per 7/15 phone note should take extra 40 mEq of Potassium with Metolazone). Spironolactone 25 mg take 1 tablet daily   Labs: 07/18/2023 Creatinine 1.38, BUN 27, Potassium 3.8, Sodium 140, GFR 49 07/06/2023 Creatinine 1.44, BUN 20, Potassium 3.4, Sodium 136, GFR 47  07/05/2023 Creatinine 1.34, BUN 20, Potassium 3.3, Sodium 137, GFR 51  07/04/2023 Creatinine 1.29, BUN 20, Potassium 3.5, Sodium 136, GFR 54  07/03/2023 Creatinine 1.23, BUN 20, Potassium 3.3, Sodium 135, GFR 57 07/02/2023 Creatinine 1.49, BUN 16, Potassium 3.6, Sodium 136, GFR 45  07/01/2023 Creatinine 1.30, BUN 14, Potassium 4.1, Sodium 140, GFR 53  06/30/2023 Creatinine 1.14, BUN 14, Potassium 4.0, Sodium 140, GFR >60  A complete set of results can be found in Results Review.   Recommendations:  No changes and encouraged to call if pt experiences any fluid symptoms.      Follow-up plan: ICM clinic phone appointment on 11/10/2023.   91 day device clinic remote transmission 12/02/2023.      EP/Cardiology Office Visits: 10/24/2023 with Dr Graciela Husbands.   Recall 10/05/2023 with HF clinic.   Copy of ICM check sent to Dr. Graciela Husbands.   3 month ICM trend: 10/13/2023.    12-14 Month ICM trend:     Karie Soda, RN 10/13/2023 3:12 PM

## 2023-10-14 ENCOUNTER — Other Ambulatory Visit: Payer: Medicare Other

## 2023-10-14 DIAGNOSIS — E785 Hyperlipidemia, unspecified: Secondary | ICD-10-CM | POA: Diagnosis not present

## 2023-10-14 DIAGNOSIS — E039 Hypothyroidism, unspecified: Secondary | ICD-10-CM | POA: Diagnosis not present

## 2023-10-14 DIAGNOSIS — N1831 Chronic kidney disease, stage 3a: Secondary | ICD-10-CM | POA: Diagnosis not present

## 2023-10-14 DIAGNOSIS — I4819 Other persistent atrial fibrillation: Secondary | ICD-10-CM | POA: Diagnosis not present

## 2023-10-14 NOTE — Addendum Note (Signed)
Addended by: Geralyn Flash D on: 10/14/2023 12:43 PM   Modules accepted: Orders

## 2023-10-14 NOTE — Progress Notes (Signed)
 Remote ICD transmission.

## 2023-10-16 ENCOUNTER — Other Ambulatory Visit: Payer: Self-pay | Admitting: Nurse Practitioner

## 2023-10-16 ENCOUNTER — Encounter: Payer: Self-pay | Admitting: Nurse Practitioner

## 2023-10-16 DIAGNOSIS — D72829 Elevated white blood cell count, unspecified: Secondary | ICD-10-CM

## 2023-10-18 LAB — COMPLETE METABOLIC PANEL WITH GFR
AG Ratio: 0.9 (calc) — ABNORMAL LOW (ref 1.0–2.5)
ALT: 24 U/L (ref 9–46)
AST: 30 U/L (ref 10–35)
Albumin: 3.1 g/dL — ABNORMAL LOW (ref 3.6–5.1)
Alkaline phosphatase (APISO): 140 U/L (ref 35–144)
BUN/Creatinine Ratio: 23 (calc) — ABNORMAL HIGH (ref 6–22)
BUN: 37 mg/dL — ABNORMAL HIGH (ref 7–25)
CO2: 34 mmol/L — ABNORMAL HIGH (ref 20–32)
Calcium: 8.4 mg/dL — ABNORMAL LOW (ref 8.6–10.3)
Chloride: 91 mmol/L — ABNORMAL LOW (ref 98–110)
Creat: 1.62 mg/dL — ABNORMAL HIGH (ref 0.70–1.22)
Globulin: 3.4 g/dL (ref 1.9–3.7)
Glucose, Bld: 108 mg/dL — ABNORMAL HIGH (ref 65–99)
Potassium: 3.6 mmol/L (ref 3.5–5.3)
Sodium: 134 mmol/L — ABNORMAL LOW (ref 135–146)
Total Bilirubin: 0.7 mg/dL (ref 0.2–1.2)
Total Protein: 6.5 g/dL (ref 6.1–8.1)
eGFR: 41 mL/min/{1.73_m2} — ABNORMAL LOW (ref >=60)

## 2023-10-18 LAB — CBC WITH DIFFERENTIAL/PLATELET
Absolute Lymphocytes: 1927 {cells}/uL (ref 850–3900)
Absolute Monocytes: 1096 {cells}/uL — ABNORMAL HIGH (ref 200–950)
Basophils Absolute: 106 {cells}/uL (ref 0–200)
Basophils Relative: 0.8 %
Eosinophils Absolute: 502 {cells}/uL — ABNORMAL HIGH (ref 15–500)
Eosinophils Relative: 3.8 %
HCT: 43.9 % (ref 38.5–50.0)
Hemoglobin: 13.7 g/dL (ref 13.2–17.1)
MCH: 26.3 pg — ABNORMAL LOW (ref 27.0–33.0)
MCHC: 31.2 g/dL — ABNORMAL LOW (ref 32.0–36.0)
MCV: 84.3 fL (ref 80.0–100.0)
MPV: 11.1 fL (ref 7.5–12.5)
Monocytes Relative: 8.3 %
Neutro Abs: 9570 {cells}/uL — ABNORMAL HIGH (ref 1500–7800)
Neutrophils Relative %: 72.5 %
Platelets: 342 10*3/uL (ref 140–400)
RBC: 5.21 10*6/uL (ref 4.20–5.80)
RDW: 17 % — ABNORMAL HIGH (ref 11.0–15.0)
Total Lymphocyte: 14.6 %
WBC: 13.2 10*3/uL — ABNORMAL HIGH (ref 3.8–10.8)

## 2023-10-18 LAB — VITAMIN D 1,25 DIHYDROXY
Vitamin D 1, 25 (OH)2 Total: 23 pg/mL (ref 18–72)
Vitamin D2 1, 25 (OH)2: <8 pg/mL
Vitamin D3 1, 25 (OH)2: 23 pg/mL

## 2023-10-18 LAB — LIPID PANEL
Cholesterol: 108 mg/dL (ref <200)
HDL: 33 mg/dL — ABNORMAL LOW (ref >=40)
LDL Cholesterol (Calc): 55 mg/dL
Non-HDL Cholesterol (Calc): 75 mg/dL (ref <130)
Total CHOL/HDL Ratio: 3.3 (calc) (ref <5.0)
Triglycerides: 113 mg/dL (ref <150)

## 2023-10-18 LAB — TSH: TSH: 3.27 m[IU]/L (ref 0.40–4.50)

## 2023-10-18 LAB — HEMOGLOBIN A1C
Hgb A1c MFr Bld: 6.8 %{Hb} — ABNORMAL HIGH (ref <5.7)
Mean Plasma Glucose: 148 mg/dL
eAG (mmol/L): 8.2 mmol/L

## 2023-10-18 LAB — VITAMIN B12: Vitamin B-12: 503 pg/mL (ref 200–1100)

## 2023-10-21 ENCOUNTER — Ambulatory Visit
Admission: RE | Admit: 2023-10-21 | Discharge: 2023-10-21 | Disposition: A | Discharge status: Home / Self Care | Payer: Medicare Other | Source: Ambulatory Visit | Attending: Nurse Practitioner | Admitting: Nurse Practitioner

## 2023-10-21 ENCOUNTER — Other Ambulatory Visit: Payer: Medicare Other

## 2023-10-21 DIAGNOSIS — J90 Pleural effusion, not elsewhere classified: Secondary | ICD-10-CM | POA: Diagnosis not present

## 2023-10-21 DIAGNOSIS — Z952 Presence of prosthetic heart valve: Secondary | ICD-10-CM | POA: Diagnosis not present

## 2023-10-21 DIAGNOSIS — D72829 Elevated white blood cell count, unspecified: Secondary | ICD-10-CM | POA: Diagnosis not present

## 2023-10-23 ENCOUNTER — Non-Acute Institutional Stay: Payer: Medicare Other | Admitting: Nurse Practitioner

## 2023-10-23 ENCOUNTER — Encounter: Payer: Self-pay | Admitting: Nurse Practitioner

## 2023-10-23 VITALS — BP 126/72 | HR 90 | Temp 98.1°F | Ht 67.0 in | Wt 251.0 lb

## 2023-10-23 DIAGNOSIS — E039 Hypothyroidism, unspecified: Secondary | ICD-10-CM

## 2023-10-23 DIAGNOSIS — E785 Hyperlipidemia, unspecified: Secondary | ICD-10-CM

## 2023-10-23 DIAGNOSIS — F339 Major depressive disorder, recurrent, unspecified: Secondary | ICD-10-CM

## 2023-10-23 DIAGNOSIS — I25810 Atherosclerosis of coronary artery bypass graft(s) without angina pectoris: Secondary | ICD-10-CM

## 2023-10-23 DIAGNOSIS — D72829 Elevated white blood cell count, unspecified: Secondary | ICD-10-CM

## 2023-10-23 DIAGNOSIS — K219 Gastro-esophageal reflux disease without esophagitis: Secondary | ICD-10-CM | POA: Diagnosis not present

## 2023-10-23 DIAGNOSIS — I10 Essential (primary) hypertension: Secondary | ICD-10-CM | POA: Diagnosis not present

## 2023-10-23 DIAGNOSIS — M109 Gout, unspecified: Secondary | ICD-10-CM

## 2023-10-23 DIAGNOSIS — I4821 Permanent atrial fibrillation: Secondary | ICD-10-CM

## 2023-10-23 DIAGNOSIS — E119 Type 2 diabetes mellitus without complications: Secondary | ICD-10-CM | POA: Insufficient documentation

## 2023-10-23 DIAGNOSIS — I509 Heart failure, unspecified: Secondary | ICD-10-CM

## 2023-10-23 DIAGNOSIS — R42 Dizziness and giddiness: Secondary | ICD-10-CM | POA: Diagnosis not present

## 2023-10-23 DIAGNOSIS — E1169 Type 2 diabetes mellitus with other specified complication: Secondary | ICD-10-CM | POA: Diagnosis not present

## 2023-10-23 MED ORDER — DEXCOM G6 SENSOR MISC: 1.0000 | Freq: Three times a day (TID) | 11 refills | Status: DC

## 2023-10-23 MED ORDER — VENLAFAXINE HCL ER 37.5 MG PO CP24: 37.5000 mg | ORAL_CAPSULE | Freq: Every day | ORAL | 1 refills | Status: DC

## 2023-10-23 MED ORDER — DEXCOM G6 TRANSMITTER MISC
1.0000 | Freq: Three times a day (TID) | 11 refills | Status: DC
Start: 2023-10-23 — End: 2024-02-13

## 2023-10-23 MED ORDER — PANTOPRAZOLE SODIUM 40 MG PO TBEC: 40.0000 mg | DELAYED_RELEASE_TABLET | Freq: Every day | ORAL | 3 refills | Status: DC

## 2023-10-23 MED ORDER — DEXCOM G6 RECEIVER DEVI
1.0000 | Freq: Three times a day (TID) | 11 refills | Status: DC
Start: 2023-10-23 — End: 2024-02-13

## 2023-10-23 NOTE — Assessment & Plan Note (Signed)
 s/p pace maker, defibrillator , on Amiodarone, Pradaxa, Metoprolol, followed by cardiology.

## 2023-10-23 NOTE — Assessment & Plan Note (Addendum)
 Cough at night, not sure is CHF vs GERD vs ?sleep apnea, dc Omeprazole, Hx of Reglan use. Protonix 40mg  every day.

## 2023-10-23 NOTE — Assessment & Plan Note (Signed)
 mild edema BLE, RLE>LLE, on Farxiga, Metoprolol, Metolazone, Spironolactone, Torsemide

## 2023-10-23 NOTE — Assessment & Plan Note (Signed)
 Bun/creat 37/1.62 10/14/23, diuretic med is contributory.

## 2023-10-23 NOTE — Assessment & Plan Note (Signed)
 on ASA, Atorvastatin, LDL 55 10/14/23

## 2023-10-23 NOTE — Assessment & Plan Note (Signed)
 Hgb A1c 6.8 10/14/23, desires Dexcom to monitor CBG, unable to finger stick CBG check.

## 2023-10-23 NOTE — Assessment & Plan Note (Signed)
 10/21/23 CXR Postop chest.  Defibrillator.  Status post TAVR. Tiny left effusion. wbc 13.2 10/14/23, CXR little effusion left, pending urine culture.

## 2023-10-23 NOTE — Assessment & Plan Note (Signed)
 no chest pain, taking Isosorbide

## 2023-10-23 NOTE — Assessment & Plan Note (Signed)
 no flare up over a year, R+L wrist, only takes Allopurinol, uric acid 7.6 10/10/22

## 2023-10-23 NOTE — Progress Notes (Signed)
 Location:   Clinic [facility]   Place of Service:  Clinic (12) Provider: Chipper Oman NP    Code Status: DNR Goals of Care:     10/23/2023   10:09 AM  Advanced Directives  Does Patient Have a Medical Advance Directive? Yes  Type of Estate agent of [place];Living will  Does patient want to make changes to medical advance directive? No - Patient declined  Copy of Healthcare Power of Attorney in Chart? Yes - validated most recent copy scanned in chart (See row information)     Chief Complaint  Patient presents with  . Dizziness    Patient was unable to verify medications as his daughter Babette Relic keeps up with them, however she was not present during the rooming process. Patient believes there is no changes in medications    HPI: Patient is a 89 y.o. male seen today for an acute visit for Discussed the use of AI scribe software for clinical note transcription with the patient, who gave verbal consent to proceed.  History of Present Illness   Justin EKSTEIN "[name]" is a 89 year old male who presents with vertigo and concerns about blood sugar levels. He is accompanied by his son, Luisa Hart, who is a Teacher, early years/pre.  He experiences vertigo, which he believes is localized to his left ear. There is uncertainty about the exact nature of the vertigo, and he is unsure if earwax buildup might be contributing to the symptoms.  His son, Luisa Hart, has expressed concerns about his blood sugar levels, noting that he is high and questioning if he might be dropping at times. This concern arises because he tends to sleep a lot, both at night and during the day, and then eats, which could be affecting his blood sugar levels.  Leukocytosis, wbc 13.2 10/14/23, CXR little effusion left, pending urine culture.     Gout, no flare up over a year, R+L wrist, only takes Allopurinol, uric acid 7.6 10/10/22             Afib, s/p pace maker, defibrillator , on Amiodarone, Pradaxa, Metoprolol, followed by  cardiology.              HLD on ASA, Atorvastatin, LDL 55 10/14/23             CAD, Hx of CABF, TAVR, no chest pain, taking Isosorbide             CHF, mild edema BLE, RLE>LLE, on Farxiga, Metoprolol, Metolazone, Spironolactone, Torsemide             Hypothyroidism, on Levothyroxine, TSH 3.27 10/14/23             HTN, blood pressure is controlled, on Losartan, Metoprolol, diuretics.              GERD, stable, on Omeprazole, Hx of Reglan use             Depression, started Sertraline 08/21/23, now 50mg  every day, feels sleeps too much and depressed.              CKD Bun/creat 37/1.62 10/14/23  T2DM, Hgb A1c 6.8 10/14/23, desires Dexcom to monitor CBG, unable to finger stick CBG check.     Past Medical History:  Diagnosis Date  . Age-related macular degeneration, dry, both eyes   . Allergic    "24/7; 365 days/year; I'm allergic to pollens, dust, all southern grasses/trees, mold, mildue, cats, dogs" (01/07/2017)  . Anal fissure   . Asthma  sees Dr. Kendrick Fries   . Benign prostatic hypertrophy    (sees Dr. Chester Holstein  . CAD (coronary artery disease)    a. s/p CABG 2007. b. Cath 08/2016 - 4/4 patent grafts.  . Carotid bruit    carotid u/s 10/10: 0.39% bilaterally  . Chronic combined systolic and diastolic CHF (congestive heart failure) (HCC)   . Complication of anesthesia 1980s   "w/anal cyst OR, he gave me a saddle block then put a narcotic in spinal cord; had a severe reaction to that" (01/07/2017)  . Congestive heart failure (CHF) (HCC)   . ED (erectile dysfunction)   . Family history of adverse reaction to anesthesia    "daughter wakes up during OR" (01/07/2017)  . GERD (gastroesophageal reflux disease)   . Gout   . HTN (hypertension)   . Hx of colonic polyps    (sees Dr. Marina Goodell)  . Hyperlipidemia   . Hypothyroidism   . Moderate to severe aortic stenosis    a. s/p TAVR 02/2017.  [name] Myocardial infarction (HCC) ~ 2000  . Obesity   . Osteoarthritis    "was in my knees, hands" (01/07/2017 )   . PAF (paroxysmal atrial fibrillation) (HCC)    a. documented post TAVR.  [name] Precancerous skin lesion    (sees Dr. Terri Piedra)  . Prostate cancer (HCC) dx'd ~ 2014  . 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 ANUS SURGERY     "opened it back up cause it wouldn't heal; wound up w/a fissure" (01/07/2017)  . BIV ICD INSERTION CRT-D N/A 05/02/2017   Procedure: BIV ICD INSERTION CRT-D;  Surgeon: Duke Salvia, MD;  Location: [facility] INVASIVE CV LAB;  Service: Cardiovascular;  Laterality: N/A;  . CARDIAC CATHETERIZATION  10/29/2005  . CARDIAC CATHETERIZATION N/A 08/27/2016   Procedure: Right/Left Heart Cath and Coronary/Graft Angiography;  Surgeon: Kathleene Hazel, MD;  Location: [facility] INVASIVE CV LAB;  Service: Cardiovascular;  Laterality: N/A;  . CARDIOVERSION N/A 04/24/2021   Procedure: CARDIOVERSION;  Surgeon: Pricilla Riffle, MD;  Location: [facility] ENDOSCOPY;  Service: Cardiovascular;  Laterality: N/A;  . CARDIOVERSION N/A 11/08/2021   Procedure: CARDIOVERSION;  Surgeon: Chrystie Nose, MD;  Location: [facility] ENDOSCOPY;  Service: Cardiovascular;  Laterality: N/A;  . CARDIOVERSION N/A 02/06/2022   Procedure: CARDIOVERSION;  Surgeon: Wendall Stade, MD;  Location: [facility] ENDOSCOPY;  Service: Cardiovascular;  Laterality: N/A;  . CATARACT EXTRACTION W/ INTRAOCULAR LENS  IMPLANT, BILATERAL Bilateral   . CATARACT EXTRACTION, BILATERAL  2012  . COLONOSCOPY  06/30/2008   no repeats needed   . COLONOSCOPY     had 3 or 4 in the past   . CORONARY ARTERY BYPASS GRAFT  2007   "CABG X4"  . CYST EXCISION PERINEAL  1980s  . HAMMER TOE SURGERY Bilateral   . ICD GENERATOR CHANGEOUT N/A 05/30/2023   Procedure: ICD GENERATOR CHANGEOUT;  Surgeon: Duke Salvia, MD;  Location: [facility] INVASIVE CV LAB;  Service: Cardiovascular;   Laterality: N/A;  . JOINT REPLACEMENT    . KNEE ARTHROPLASTY  07/30/2011   Procedure: COMPUTER ASSISTED TOTAL KNEE ARTHROPLASTY;  Surgeon: Cammy Copa;  Location: [facility] OR;  Service: Orthopedics;  Laterality: Left;  left total knee arthroplasty  . MASTECTOMY SUBCUTANEOUS Bilateral   . MULTIPLE TOOTH EXTRACTIONS    . ORIF  FINGER / THUMB FRACTURE Right ~ 1980   "repair of thumb injury"  . PROSTATE BIOPSY    . REPLACEMENT TOTAL KNEE BILATERAL Bilateral 2012  . TEE WITHOUT CARDIOVERSION N/A 03/04/2017   Procedure: TRANSESOPHAGEAL ECHOCARDIOGRAM (TEE);  Surgeon: Kathleene Hazel, MD;  Location: [facility] OR;  Service: Open Heart Surgery;  Laterality: N/A;  . TOTAL KNEE REVISION Right 05/18/2019   Procedure: RIGHT PATELLA REVISION/REMOVAL;  Surgeon: Cammy Copa, MD;  Location: [facility] OR;  Service: Orthopedics;  Laterality: Right;  . TRANSCATHETER AORTIC VALVE REPLACEMENT, TRANSFEMORAL N/A 03/04/2017   Procedure: TRANSCATHETER AORTIC VALVE REPLACEMENT, TRANSFEMORAL;  Surgeon: Kathleene Hazel, MD;  Location: [facility] OR;  Service: Open Heart Surgery;  Laterality: N/A;    Allergies  Allergen Reactions  . Peanut-Containing Drug Products Anaphylaxis  . Sulfonamide Derivatives Anaphylaxis  . Amlodipine Swelling and Other (See Comments)    Swelling in ankles  . Eliquis [Apixaban] Other (See Comments)    Back/hip pain  . Lisinopril Cough  . Xarelto [Rivaroxaban] Other (See Comments)    Back/hip pain    Allergies as of 10/23/2023       Reactions   Peanut-containing Drug Products Anaphylaxis   Sulfonamide Derivatives Anaphylaxis   Amlodipine Swelling, Other (See Comments)   Swelling in ankles   Eliquis [apixaban] Other (See Comments)   Back/hip pain   Lisinopril Cough   Xarelto [rivaroxaban] Other (See Comments)   Back/hip pain        Medication List        Accurate as of October 23, 2023  3:27 PM. If you have any questions, ask your nurse or doctor.          albuterol  108 (90 Base) MCG/ACT inhaler Commonly known as: VENTOLIN HFA Inhale 2 puffs into the lungs every 4 (four) hours as needed for wheezing or shortness of breath.   allopurinol 100 MG tablet Commonly known as: ZYLOPRIM Take 1 tablet (100 mg total) by mouth daily.   amiodarone 200 MG tablet Commonly known as: PACERONE Take 2 tablets by mouth twice daily x 2 weeks then decrease to 2 tablets by mouth daily x 2 weeks   aspirin 81 MG tablet Take 1 tablet (81 mg total) by mouth daily.   atorvastatin 20 MG tablet Commonly known as: LIPITOR TAKE ONE TABLET ONCE DAILY   benzonatate 200 MG capsule Commonly known as: TESSALON Take 1 capsule (200 mg total) by mouth 2 (two) times daily as needed for cough.   CAL MAG ZINC +D3 PO Take 2 tablets by mouth in the morning. 500 mg / 7 mg / 25 mcg   cetirizine 10 MG tablet Commonly known as: ZYRTEC Take 10 mg by mouth 2 (two) times daily.   colchicine 0.6 MG tablet Take 1 tablet (0.6 mg total) by mouth every 6 (six) hours as needed (gout).   dabigatran 150 MG Caps capsule Commonly known as: PRADAXA TAKE 1 CAPSULE BY MOUTH TWICE  DAILY   Dexcom G6 Receiver Devi 1 Device by Does not apply route 3 (three) times daily. E11.69 Started by: Rovena Hearld X Fajr Fife   Dexcom G6 Sensor Misc 1 Device by Does not apply route 3 (three) times daily. E11.69 Started by: Gamble Enderle X Michaelle Bottomley   Dexcom G6 Transmitter Misc 1 Device by Does not apply route 3 (three) times daily. E11.69 Started by: Parthena Fergeson X Modean Mccullum   Farxiga 10 MG Tabs tablet Generic drug: dapagliflozin propanediol Take 10 mg by mouth daily before breakfast.   isosorbide mononitrate 30  MG 24 hr tablet Commonly known as: IMDUR Take 1 tablet (30 mg total) by mouth daily.   ketotifen 0.025 % ophthalmic solution Commonly known as: ZADITOR Place 1 drop into both eyes daily as needed (for itching).   levothyroxine 200 MCG tablet Commonly known as: SYNTHROID Take 1 tablet (200 mcg total) by mouth daily.   losartan  25 MG tablet Commonly known as: Cozaar Take 0.5 tablets (12.5 mg total) by mouth daily.   methocarbamol 500 MG tablet Commonly known as: ROBAXIN Take 1 tablet (500 mg total) by mouth 4 (four) times daily as needed for muscle spasms.   metoCLOPramide 10 MG tablet Commonly known as: REGLAN TAKE ONE TABLET BY MOUTH EVERY MORNING WITH BREAKFAST   metolazone 2.5 MG tablet Commonly known as: ZAROXOLYN Take 1 tablet (2.5 mg total) by mouth once a week. Take 1 tablet every Saturday   metoprolol succinate 50 MG 24 hr tablet Commonly known as: TOPROL-XL Take 2 tablets (100 mg total) by mouth daily. Take with or immediately following a meal.   montelukast 10 MG tablet Commonly known as: SINGULAIR TAKE ONE TABLET BY MOUTH ONCE DAILY IN THE EVENING   Multi-Vitamin Gummies Chew Chew 2 tablets by mouth See admin instructions. Chew 2 gummies by mouth in the morning   PreserVision AREDS 2 Caps Take 1 capsule by mouth in the morning and at bedtime.   omeprazole 20 MG capsule Commonly known as: PRILOSEC TAKE ONE CAPSULE TWICE DAILY   pantoprazole 40 MG tablet Commonly known as: PROTONIX Take 1 tablet (40 mg total) by mouth daily. Started by: Clifford Benninger X Isayah Ignasiak   potassium chloride SA 20 MEQ tablet Commonly known as: KLOR-CON M Take 1 tablet (20 mEq total) by mouth 2 (two) times daily.   sertraline 50 MG tablet Commonly known as: ZOLOFT Take 1 tablet (50 mg total) by mouth daily.   silver sulfADIAZINE 1 % cream Commonly known as: Silvadene Apply 1 Application topically 2 (two) times daily.   spironolactone 25 MG tablet Commonly known as: ALDACTONE Take 1 tablet (25 mg total) by mouth daily.   torsemide 20 MG tablet Commonly known as: DEMADEX Take 2 tablets (40 mg total) every other day, alternating with 1 tablet (20 mg total) every other day.   triamcinolone cream 0.1 % Commonly known as: KENALOG Apply 1 application  topically daily as needed for dry skin (affected areas).    valACYclovir 500 MG tablet Commonly known as: VALTREX Take 500 mg by mouth 2 (two) times daily as needed (cold sores).   venlafaxine XR 37.5 MG 24 hr capsule Commonly known as: Effexor XR Take 1 capsule (37.5 mg total) by mouth daily with breakfast. Started by: Valory Wetherby X Jarielys Girardot        Review of Systems:  Review of Systems  Constitutional:  Negative for appetite change, fatigue and fever.  HENT:  Negative for congestion and trouble swallowing.   Eyes:  Negative for visual disturbance.  Respiratory:  Negative for cough, shortness of breath and wheezing.   Cardiovascular:  Positive for leg swelling. Negative for chest pain and palpitations.  Gastrointestinal:  Negative for abdominal pain and constipation.  Genitourinary:  Positive for frequency. Negative for difficulty urinating and urgency.       Nocturnal urination about every 2-3 hours.   Musculoskeletal:  Positive for arthralgias and gait problem.  Skin:  Negative for color change.  Neurological:  Negative for dizziness, speech difficulty and weakness.  Psychiatric/Behavioral:  Positive for dysphoric mood. Negative for confusion  and sleep disturbance. The patient is not nervous/anxious.     Health Maintenance  Topic Date Due  . FOOT EXAM  Never done  . OPHTHALMOLOGY EXAM  Never done  . COVID-19 Vaccine (6 - 2024-25 season) 05/26/2025 (Originally 04/27/2023)  . Medicare Annual Wellness (AWV)  03/30/2024  . HEMOGLOBIN A1C  04/12/2024  . DTaP/Tdap/Td (3 - Td or Tdap) 10/28/2030  . Pneumonia Vaccine 69+ Years old  Completed  . INFLUENZA VACCINE  Completed  . Zoster Vaccines- Shingrix  Completed  . HPV VACCINES  Aged Out    Physical Exam: Vitals:   10/23/23 1006  BP: 126/72  Pulse: 90  Temp: 98.1 F (36.7 C)  SpO2: 97%  Weight: 251 lb (113.9 kg)  Height: 5\' 7"  (1.702 m)   Body mass index is 39.31 kg/m. Physical Exam Vitals and nursing note reviewed.  Constitutional:      Appearance: He is obese.  HENT:     Head:  Normocephalic and atraumatic.     Ears:     Comments: Impacted cerumen removed with ear lavage.     Nose: Nose normal.  Eyes:     Extraocular Movements: Extraocular movements intact.     Conjunctiva/sclera: Conjunctivae normal.     Pupils: Pupils are equal, round, and reactive to light.  Cardiovascular:     Rate and Rhythm: Normal rate. Rhythm irregular.     Heart sounds: No murmur heard.    Comments: Left upper chest pace maker, defibrillator  Pulmonary:     Effort: Pulmonary effort is normal.     Breath sounds: Rales present.     Comments: Right base rales.  Abdominal:     General: Bowel sounds are normal.     Palpations: Abdomen is soft.     Tenderness: There is no abdominal tenderness.  Musculoskeletal:     Cervical back: Normal range of motion and neck supple.     Right lower leg: Edema present.     Left lower leg: Edema present.     Comments: Mild edema BLE, R>L  Skin:    General: Skin is warm and dry.  Neurological:     General: No focal deficit present.     Mental Status: He is alert and oriented to person, place, and time. Mental status is at baseline.     Motor: No weakness.     Gait: Gait abnormal.  Psychiatric:        Mood and Affect: Mood normal.        Behavior: Behavior normal.        Thought Content: Thought content normal.        Judgment: Judgment normal.     Comments: Stated he feels difficulty adjusting to [facility]     Labs reviewed: Basic Metabolic Panel: Recent Labs    04/18/23 1141 05/23/23 1220 06/30/23 1616 06/30/23 1637 07/01/23 0355 07/02/23 0415 07/06/23 0357 07/18/23 1042 10/14/23 0729  NA 138   < >  --   --  140   < > 136 140 134*  K 3.9   < >  --   --  4.1   < > 3.4* 3.8 3.6  CL 95*   < >  --   --  106   < > 99 97 91*  CO2 25   < >  --   --  24   < > 27 25 34*  GLUCOSE 131*   < >  --   --  125*   < >  113* 90 108*  BUN 29*   < >  --   --  14   < > 20 27 37*  CREATININE 1.33*   < >  --   --  1.30*   < > 1.44* 1.38* 1.62*  CALCIUM  8.8   < >  --   --  7.8*   < > 8.2* 8.4* 8.4*  MG  --   --  2.7*  --   --   --   --   --   --   PHOS  --   --  2.3*  --  3.3  --   --   --   --   TSH 0.844  --   --  9.303*  --   --   --   --  3.27   < > = values in this interval not displayed.   Liver Function Tests: Recent Labs    02/17/23 1231 04/18/23 1141 06/30/23 1321 10/14/23 0729  AST 27 27 30 30   ALT 18 23 26 24   ALKPHOS 119 122* 132*  --   BILITOT 0.6 0.8 1.1 0.7  PROT 7.3 6.7 7.0 6.5  ALBUMIN 3.8 3.3* 2.7*  --    No results for input(s): "LIPASE", "AMYLASE" in the last 8760 hours. No results for input(s): "AMMONIA" in the last 8760 hours. CBC: Recent Labs    06/30/23 1321 07/01/23 0355 10/14/23 0729  WBC 11.5* 11.9* 13.2*  NEUTROABS 8.9*  --  9,570*  HGB 12.5* 11.2* 13.7  HCT 41.6 36.9* 43.9  MCV 87.6 87.4 84.3  PLT 313 283 342   Lipid Panel: Recent Labs    07/03/23 0426 10/14/23 0729  CHOL 74 108  HDL 25* 33*  LDLCALC 30 55  TRIG 96 113  CHOLHDL 3.0 3.3   Lab Results  Component Value Date   HGBA1C 6.8 (H) 10/14/2023    Procedures since last visit: DG Chest 2 View Result Date: 10/21/2023 CLINICAL DATA:  Leukocytosis EXAM: CHEST - 2 VIEW COMPARISON:  X-ray 06/30/2023 FINDINGS: Sternal wires. Enlarged cardiopericardial silhouette. Status post TAVR. Left upper chest defibrillator. Tiny left pleural effusion. No pneumothorax, edema or consolidation. Degenerative changes along the spine. IMPRESSION: Postop chest.  Defibrillator.  Status post TAVR. Tiny left effusion. Electronically Signed   By: Karen Kays M.D.   On: 10/21/2023 13:08    Assessment/Plan Leukocytosis 10/21/23 CXR Postop chest.  Defibrillator.  Status post TAVR. Tiny left effusion. wbc 13.2 10/14/23, CXR little effusion left, pending urine culture.   GOUT no flare up over a year, R+L wrist, only takes Allopurinol, uric acid 7.6 10/10/22  Permanent atrial fibrillation (HCC) s/p pace maker, defibrillator , on Amiodarone, Pradaxa,  Metoprolol, followed by cardiology.   Hyperlipidemia on ASA, Atorvastatin, LDL 55 10/14/23  CAD, AUTOLOGOUS BYPASS GRAFT no chest pain, taking Isosorbide  Chronic congestive heart failure (HCC) mild edema BLE, RLE>LLE, on Farxiga, Metoprolol, Metolazone, Spironolactone, Torsemide  Hypothyroidism  on Levothyroxine, TSH 3.27 10/14/23  Essential hypertension blood pressure is controlled, on Losartan, Metoprolol, diuretics.   Gastroesophageal reflux disease Cough at night, not sure is CHF vs GERD vs ?sleep apnea, dc Omeprazole, Hx of Reglan use. Protonix 40mg  every day.   Depression, recurrent (HCC)  started Sertraline 08/21/23, now 50mg  every day, feels sleeps too much and depressed. Taper off Zoloft, will try Effexor.   CKD (chronic kidney disease) stage 3, GFR 30-59 ml/min (HCC) Bun/creat 37/1.62 10/14/23, diuretic med is contributory.   Vertigo Wants to monitor  CBG closely and sleep study.   Type 2 diabetes mellitus (HCC) Hgb A1c 6.8 10/14/23, desires Dexcom to monitor CBG, unable to finger stick CBG check.        Labs/tests ordered:  none Next appt:  10/30/2023

## 2023-10-23 NOTE — Assessment & Plan Note (Addendum)
 started Sertraline 08/21/23, now 50mg  every day, feels sleeps too much and depressed. Taper off Zoloft, will try Effexor.

## 2023-10-23 NOTE — Assessment & Plan Note (Signed)
 on Levothyroxine, TSH 3.27 10/14/23

## 2023-10-23 NOTE — Assessment & Plan Note (Signed)
 blood pressure is controlled, on Losartan, Metoprolol, diuretics.

## 2023-10-23 NOTE — Assessment & Plan Note (Signed)
 Wants to monitor CBG closely and sleep study.

## 2023-10-24 ENCOUNTER — Other Ambulatory Visit: Payer: Self-pay | Admitting: Cardiovascular Disease

## 2023-10-24 ENCOUNTER — Telehealth: Payer: Self-pay

## 2023-10-24 ENCOUNTER — Ambulatory Visit: Payer: Medicare Other | Attending: Internal Medicine | Admitting: Internal Medicine

## 2023-10-24 ENCOUNTER — Other Ambulatory Visit: Payer: Self-pay | Admitting: Family Medicine

## 2023-10-24 ENCOUNTER — Other Ambulatory Visit: Payer: Self-pay | Admitting: Internal Medicine

## 2023-10-24 ENCOUNTER — Encounter: Payer: Self-pay | Admitting: Internal Medicine

## 2023-10-24 ENCOUNTER — Encounter: Payer: Self-pay | Admitting: Nurse Practitioner

## 2023-10-24 VITALS — BP 110/64 | HR 88 | Ht 67.0 in | Wt 252.0 lb

## 2023-10-24 DIAGNOSIS — I493 Ventricular premature depolarization: Secondary | ICD-10-CM

## 2023-10-24 DIAGNOSIS — I4729 Other ventricular tachycardia: Secondary | ICD-10-CM | POA: Diagnosis not present

## 2023-10-24 DIAGNOSIS — Z95810 Presence of automatic (implantable) cardiac defibrillator: Secondary | ICD-10-CM

## 2023-10-24 DIAGNOSIS — I4821 Permanent atrial fibrillation: Secondary | ICD-10-CM

## 2023-10-24 LAB — CUP PACEART INCLINIC DEVICE CHECK
Date Time Interrogation Session: 20250228195213
Implantable Lead Connection Status: 753985
Implantable Lead Connection Status: 753985
Implantable Lead Connection Status: 753985
Implantable Lead Implant Date: 20180907
Implantable Lead Implant Date: 20180907
Implantable Lead Implant Date: 20180907
Implantable Lead Location: 753858
Implantable Lead Location: 753859
Implantable Lead Location: 753860
Implantable Lead Model: 4396
Implantable Lead Model: 5076
Implantable Pulse Generator Implant Date: 20241004

## 2023-10-24 LAB — CULTURE, URINE COMPREHENSIVE
MICRO NUMBER:: 16125694
SPECIMEN QUALITY:: ADEQUATE

## 2023-10-24 LAB — EXTRA URINE SPECIMEN

## 2023-10-24 NOTE — Progress Notes (Signed)
 The patient's urine culture showed positive for UTI, susceptible to Augmenting, will start Augmenting 875/125mg  pi q12 hrs x 7 days

## 2023-10-24 NOTE — Patient Instructions (Signed)
 Medication Instructions:  Your physician recommends that you continue on your current medications as directed. Please refer to the Current Medication list given to you today.  *If you need a refill on your cardiac medications before your next appointment, please call your pharmacy*   Lab Work: None ordered.  If you have labs (blood work) drawn today and your tests are completely normal, you will receive your results only by: MyChart Message (if you have MyChart) OR A paper copy in the mail If you have any lab test that is abnormal or we need to change your treatment, we will call you to review the results.   Testing/Procedures: None ordered.    Follow-Up: At [facility], you and your health needs are our priority.  As part of our continuing mission to provide you with exceptional heart care, we have created designated Provider Care Teams.  These Care Teams include your primary Cardiologist (physician) and Advanced Practice Providers (APPs -  Physician Assistants and Nurse Practitioners) who all work together to provide you with the care you need, when you need it.  We recommend signing up for the patient portal called "MyChart".  Sign up information is provided on this After Visit Summary.  MyChart is used to connect with patients for Virtual Visits (Telemedicine).  Patients are able to view lab/test results, encounter notes, upcoming appointments, etc.  Non-urgent messages can be sent to your provider as well.   To learn more about what you can do with MyChart, go to [email or web address].    Your next appointment:   6 months with Dr Graciela Husbands

## 2023-10-24 NOTE — Progress Notes (Signed)
 The patient's urine culture showed positive for UTI, susceptible to Augmenting, will start A

## 2023-10-24 NOTE — Progress Notes (Signed)
 Patient Care Team: Mast, Man X, NP as PCP - General (Internal Medicine) Kathleene Hazel, MD as PCP - Cardiology (Cardiology) Duke Salvia, MD as PCP - Electrophysiology (Cardiology) Jerilee Field, MD as Consulting Physician (Urology) Verner Chol, [facility] (Inactive) as Pharmacist (Pharmacist)   HPI  Justin [name] is a 89 y.o. male Seen for follow-up of a CRT ICD 9/18; gen change 10/22. implanted because of syncope  and new left bundle branch block following TAVR ; hx of Afib on dabigitran and ASA;  intercurrent Afib with DCCV 9/22   Initially significantly improved following CRT.  Now with repeated issues with exercise intolerance.  Some edema.  No chest pain.  Interval atrial fibrillation requiring repeat cardioversion 3/16 not associated with significant symptoms  Significant PVC burden which is affecting his CRT pacing percentage.  Repeat echo as noted below was surprisingly good with some interval improvement (may be)   Repeat cardioversion 3/23 and then started on amiodarone.  Seen 6/23 with persistent atrial flutter for about 2 months and then submitted for cardioversion.  When seen 2 weeks later, he was again out of rhythm.  7/23 seen in the office recurrent atrial tachycardia/flutter.  Underwent in office pace termination and ATP algorithms were activated. Amiodarone was discontinued because of failure to maintain sinus rhythm and then resumed at last visit because of increasing burden of PVCs..  Biggest complaint is fatigue, some swelling but better.  Mod dyspnea.  Adjusting to life at [facility],-- wife also there in nursing center  Patient denies symptoms of respiratory, GI intolerance, sun sensitivity, neurological symptoms attributable to amiodarone.           DATE TEST EF   1/18 LHC  Patent grafts  8/18 Echo   40-45 %   6/19 Echo   40-45 %   9/20 Echo  35-40%   1/23 Echo 40-45%   3/24 Echo  40-45%   11/24 Echo  40-45%     DATE Cr K  Hgb TSH LFTs  11/22  1.08 4.7 13.0(8/22)      3/23 1.3 4.3 14.6 4.72(4/23) 9(4/23)  }8/23 1.58 4.5 13.2    3/24 1.81<<2.37 4.3 13.2    2/25 1.62 3.9 13.7 3.27 24              Past Medical History:  Diagnosis Date   Age-related macular degeneration, dry, both eyes    Allergic    "24/7; 365 days/year; I'm allergic to pollens, dust, all southern grasses/trees, mold, mildue, cats, dogs" (01/07/2017)   Anal fissure    Asthma    sees Dr. Kendrick Fries    Benign prostatic hypertrophy    (sees Dr. Chester Holstein   CAD (coronary artery disease)    a. s/p CABG 2007. b. Cath 08/2016 - 4/4 patent grafts.   Carotid bruit    carotid u/s 10/10: 0.39% bilaterally   Chronic combined systolic and diastolic CHF (congestive heart failure) (HCC)    Complication of anesthesia 1980s   "w/anal cyst OR, he gave me a saddle block then put a narcotic in spinal cord; had a severe reaction to that" (01/07/2017)   Congestive heart failure (CHF) [facility])    ED (erectile dysfunction)    Family history of adverse reaction to anesthesia    "daughter wakes up during OR" (01/07/2017)   GERD (gastroesophageal reflux disease)    Gout    HTN (hypertension)    Hx of colonic polyps    (sees Dr. Marina Goodell)  Hyperlipidemia    Hypothyroidism    Moderate to severe aortic stenosis    a. s/p TAVR 02/2017.   Myocardial infarction [facility]) ~ 2000   Obesity    Osteoarthritis    "was in my knees, hands" (01/07/2017 )   PAF (paroxysmal atrial fibrillation) (HCC)    a. documented post TAVR.   Precancerous skin lesion    (sees Dr. 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   29 mm Edwards Sapien 3 transcatheter heart valve placed via percutaneous right transfemoral approach    Past Surgical History:  Procedure Laterality Date   ANUS SURGERY     "opened it back up cause it wouldn't heal; wound up w/a fissure" (01/07/2017)   BIV ICD INSERTION CRT-D N/A  05/02/2017   Procedure: BIV ICD INSERTION CRT-D;  Surgeon: Duke Salvia, MD;  Location: [facility] INVASIVE CV LAB;  Service: Cardiovascular;  Laterality: N/A;   CARDIAC CATHETERIZATION  10/29/2005   CARDIAC CATHETERIZATION N/A 08/27/2016   Procedure: Right/Left Heart Cath and Coronary/Graft Angiography;  Surgeon: Kathleene Hazel, MD;  Location: [facility] INVASIVE CV LAB;  Service: Cardiovascular;  Laterality: N/A;   CARDIOVERSION N/A 04/24/2021   Procedure: CARDIOVERSION;  Surgeon: Pricilla Riffle, MD;  Location: [facility] ENDOSCOPY;  Service: Cardiovascular;  Laterality: N/A;   CARDIOVERSION N/A 11/08/2021   Procedure: CARDIOVERSION;  Surgeon: Chrystie Nose, MD;  Location: [facility] ENDOSCOPY;  Service: Cardiovascular;  Laterality: N/A;   CARDIOVERSION N/A 02/06/2022   Procedure: CARDIOVERSION;  Surgeon: Wendall Stade, MD;  Location: [facility] ENDOSCOPY;  Service: Cardiovascular;  Laterality: N/A;   CATARACT EXTRACTION W/ INTRAOCULAR LENS  IMPLANT, BILATERAL Bilateral    CATARACT EXTRACTION, BILATERAL  2012   COLONOSCOPY  06/30/2008   no repeats needed    COLONOSCOPY     had 3 or 4 in the past    CORONARY ARTERY BYPASS GRAFT  2007   "CABG X4"   CYST EXCISION PERINEAL  1980s   HAMMER TOE SURGERY Bilateral    ICD GENERATOR CHANGEOUT N/A 05/30/2023   Procedure: ICD GENERATOR CHANGEOUT;  Surgeon: Duke Salvia, MD;  Location: [facility] INVASIVE CV LAB;  Service: Cardiovascular;  Laterality: N/A;   JOINT REPLACEMENT     KNEE ARTHROPLASTY  07/30/2011   Procedure: COMPUTER ASSISTED TOTAL KNEE ARTHROPLASTY;  Surgeon: Cammy Copa;  Location: [facility] OR;  Service: Orthopedics;  Laterality: Left;  left total knee arthroplasty   MASTECTOMY SUBCUTANEOUS Bilateral    MULTIPLE TOOTH EXTRACTIONS     ORIF FINGER / THUMB FRACTURE Right ~ 1980   "repair of thumb injury"   PROSTATE BIOPSY     REPLACEMENT TOTAL KNEE BILATERAL Bilateral 2012   TEE WITHOUT CARDIOVERSION N/A 03/04/2017   Procedure: TRANSESOPHAGEAL ECHOCARDIOGRAM (TEE);   Surgeon: Kathleene Hazel, MD;  Location: [facility] OR;  Service: Open Heart Surgery;  Laterality: N/A;   TOTAL KNEE REVISION Right 05/18/2019   Procedure: RIGHT PATELLA REVISION/REMOVAL;  Surgeon: Cammy Copa, MD;  Location: [facility] OR;  Service: Orthopedics;  Laterality: Right;   TRANSCATHETER AORTIC VALVE REPLACEMENT, TRANSFEMORAL N/A 03/04/2017   Procedure: TRANSCATHETER AORTIC VALVE REPLACEMENT, TRANSFEMORAL;  Surgeon: Kathleene Hazel, MD;  Location: [facility] OR;  Service: Open Heart Surgery;  Laterality: N/A;    Current Outpatient Medications  Medication Sig Dispense Refill   albuterol (VENTOLIN HFA) 108 (90 Base) MCG/ACT inhaler Inhale 2 puffs into the lungs every 4 (four) hours  as needed for wheezing or shortness of breath. 8 g 5   allopurinol (ZYLOPRIM) 100 MG tablet Take 1 tablet (100 mg total) by mouth daily. 90 tablet 3   amiodarone (PACERONE) 200 MG tablet Take 2 tablets by mouth twice daily x 2 weeks then decrease to 2 tablets by mouth daily x 2 weeks 84 tablet 0   aspirin 81 MG tablet Take 1 tablet (81 mg total) by mouth daily. 30 tablet 0   atorvastatin (LIPITOR) 20 MG tablet TAKE ONE TABLET ONCE DAILY 90 tablet 3   benzonatate (TESSALON) 200 MG capsule Take 1 capsule (200 mg total) by mouth 2 (two) times daily as needed for cough. 60 capsule 2   cetirizine (ZYRTEC) 10 MG tablet Take 10 mg by mouth 2 (two) times daily.     colchicine 0.6 MG tablet Take 1 tablet (0.6 mg total) by mouth every 6 (six) hours as needed (gout). 60 tablet 5   Continuous Glucose Receiver (DEXCOM G6 RECEIVER) DEVI 1 Device by Does not apply route 3 (three) times daily. E11.69 1 each 11   Continuous Glucose Sensor (DEXCOM G6 SENSOR) MISC 1 Device by Does not apply route 3 (three) times daily. E11.69 3 each 11   Continuous Glucose Transmitter (DEXCOM G6 TRANSMITTER) MISC 1 Device by Does not apply route 3 (three) times daily. E11.69 1 each 11   dabigatran (PRADAXA) 150 MG CAPS capsule TAKE 1 CAPSULE BY  MOUTH TWICE  DAILY 180 capsule 3   dapagliflozin propanediol (FARXIGA) 10 MG TABS tablet Take 10 mg by mouth daily before breakfast.     isosorbide mononitrate (IMDUR) 30 MG 24 hr tablet Take 1 tablet (30 mg total) by mouth daily. 90 tablet 3   ketotifen (ZADITOR) 0.025 % ophthalmic solution Place 1 drop into both eyes daily as needed (for itching).     levothyroxine (SYNTHROID) 200 MCG tablet Take 1 tablet (200 mcg total) by mouth daily. 90 tablet 3   losartan (COZAAR) 25 MG tablet Take 0.5 tablets (12.5 mg total) by mouth daily. 30 tablet 11   methocarbamol (ROBAXIN) 500 MG tablet Take 1 tablet (500 mg total) by mouth 4 (four) times daily as needed for muscle spasms. 50 tablet 0   metoCLOPramide (REGLAN) 10 MG tablet TAKE ONE TABLET BY MOUTH EVERY MORNING WITH BREAKFAST 30 tablet 3   metolazone (ZAROXOLYN) 2.5 MG tablet Take 1 tablet (2.5 mg total) by mouth once a week. Take 1 tablet every Saturday 4 tablet 3   metoprolol succinate (TOPROL-XL) 50 MG 24 hr tablet Take 2 tablets (100 mg total) by mouth daily. Take with or immediately following a meal. 180 tablet 2   montelukast (SINGULAIR) 10 MG tablet TAKE ONE TABLET BY MOUTH ONCE DAILY IN THE EVENING 90 tablet 3   Multiple Minerals-Vitamins (CAL MAG ZINC +D3 PO) Take 2 tablets by mouth in the morning. 500 mg / 7 mg / 25 mcg     Multiple Vitamins-Minerals (MULTI-VITAMIN GUMMIES) CHEW Chew 2 tablets by mouth See admin instructions. Chew 2 gummies by mouth in the morning     Multiple Vitamins-Minerals (PRESERVISION AREDS 2) CAPS Take 1 capsule by mouth in the morning and at bedtime.     omeprazole (PRILOSEC) 20 MG capsule TAKE ONE CAPSULE TWICE DAILY 180 capsule 0   pantoprazole (PROTONIX) 40 MG tablet Take 1 tablet (40 mg total) by mouth daily. 30 tablet 3   potassium chloride SA (KLOR-CON M) 20 MEQ tablet Take 1 tablet (20 mEq total) by mouth  2 (two) times daily. 180 tablet 3   sertraline (ZOLOFT) 50 MG tablet Take 1 tablet (50 mg total) by mouth  daily. 30 tablet 2   silver sulfADIAZINE (SILVADENE) 1 % cream Apply 1 Application topically 2 (two) times daily. 50 g 5   spironolactone (ALDACTONE) 25 MG tablet Take 1 tablet (25 mg total) by mouth daily. 30 tablet 0   torsemide (DEMADEX) 20 MG tablet Take 2 tablets (40 mg total) every other day, alternating with 1 tablet (20 mg total) every other day. 180 tablet 3   triamcinolone cream (KENALOG) 0.1 % Apply 1 application  topically daily as needed for dry skin (affected areas).     valACYclovir (VALTREX) 500 MG tablet Take 500 mg by mouth 2 (two) times daily as needed (cold sores).     venlafaxine XR (EFFEXOR XR) 37.5 MG 24 hr capsule Take 1 capsule (37.5 mg total) by mouth daily with breakfast. 30 capsule 1   No current facility-administered medications for this visit.    Allergies  Allergen Reactions   Peanut-Containing Drug Products Anaphylaxis   Sulfonamide Derivatives Anaphylaxis   Amlodipine Swelling and Other (See Comments)    Swelling in ankles   Eliquis [Apixaban] Other (See Comments)    Back/hip pain   Lisinopril Cough   Xarelto [Rivaroxaban] Other (See Comments)    Back/hip pain     Review of Systems negative except from HPI and PMH Please see the history of present illness. (+)  All other systems are reviewed and negative.     Physical Exam  BP 110/64   Pulse 88   Ht 5\' 7"  (1.702 m)   Wt 252 lb (114.3 kg)   SpO2 96%   BMI 39.47 kg/m  Well developed and Morbidly obese  in no acute distress HENT normal Neck supple   Clear Device pocket well healed; without hematoma or erythema.  There is no tethering  Regular rate and rhythm, no  gallop No murmur Abd-soft with active BS No Clubbing cyanosis 2+ edema Skin-warm and dry A & Oriented  Grossly normal sensory and motor function  ECG atrial fib  Ventricular pacing upright QRS lead V1 New QRS lead 1  Device function is normal. Programming changes chronic settings   See Paceart for details    Assessment  and  Plan  Ventricular tachycardia- prolonged- nonsustained  PVCs  Atrial fibrillation-permanent  Post TAVR left bundle branch block/IVCD  Cardiomyopathy EF 40-45%  CRT- D [employer]       CHF chronic systolic/diastolic   Morbidly obese  .  Renal insufficiency Estimated Creatinine Clearance: 38.8 mL/min (A) (by C-G formula based on SCr of 1.62 mg/dL (H)).    PVC burden less--with improved effective CRT 60>>75% HR average down form 100>>90s or so   Volume status is stable given renal insufficiency.  I think we will have to tolerate some edema given his tendency for his creatinine  No bleeding continue dabigitran ; also on ASA will reach out to Dr. Esmond Camper as to whether he needs it adjunctively.  With a UTI, I do not think he is a candidate for an SGLT2.  Discussed the negotiations of this aspect of life

## 2023-10-24 NOTE — Telephone Encounter (Signed)
-----   Message from Man X Mast sent at 10/24/2023  9:51 AM EST ----- Regarding: UTI Please let the patient know his urine culture is positive for UTI, susceptible to Augmenting. I have phoned in 7 day course of Augmentin 875/125mg  every 12 hours by mouth. Thank you

## 2023-10-24 NOTE — Telephone Encounter (Signed)
 Called and dicussed results

## 2023-10-24 NOTE — Telephone Encounter (Signed)
 Prescription refill request for Pradaxa received.  Indication:afib Last office visit:2/25 Weight:114.3  kg Age:89 Scr:1.62  2/25 CrCl:51.94  ml/min  Prescription refilled

## 2023-10-25 ENCOUNTER — Other Ambulatory Visit: Payer: Self-pay | Admitting: Cardiovascular Disease

## 2023-10-27 ENCOUNTER — Other Ambulatory Visit: Payer: Self-pay | Admitting: Internal Medicine

## 2023-10-28 ENCOUNTER — Other Ambulatory Visit (HOSPITAL_COMMUNITY): Payer: Self-pay

## 2023-10-28 ENCOUNTER — Telehealth: Payer: Self-pay | Admitting: Pharmacy Technician

## 2023-10-28 NOTE — Telephone Encounter (Signed)
 Pharmacy Patient Advocate Encounter   Received notification from Fax that prior authorization for Dabigatran Etexilate Mesylate 150MG  capsules is required/requested.   Insurance verification completed.   The patient is insured through [employer] .   Per test claim: PA required; PA submitted to above mentioned insurance via CoverMyMeds Key/confirmation #/EOC [facility] Status is pending

## 2023-10-29 ENCOUNTER — Other Ambulatory Visit (HOSPITAL_COMMUNITY): Payer: Self-pay

## 2023-10-29 NOTE — Telephone Encounter (Signed)
 Pharmacy Patient Advocate Encounter  Received notification from [facility] that Prior Authorization for Dabigatran Etexilate Mesylate 150MG  capsules has been APPROVED from 10/28/23 to 08/25/24. Ran test claim, Copay is $159.08. This test claim was processed through [facility]- copay amounts may vary at other pharmacies due to pharmacy/plan contracts, or as the patient moves through the different stages of their insurance plan.   PA #/Case ID/Reference #: [id number]

## 2023-10-30 ENCOUNTER — Non-Acute Institutional Stay: Payer: Medicare Other | Admitting: Nurse Practitioner

## 2023-10-30 ENCOUNTER — Encounter: Payer: Self-pay | Admitting: Nurse Practitioner

## 2023-10-30 VITALS — BP 118/66 | HR 83 | Temp 97.8°F | Resp 18 | Ht 67.0 in | Wt 250.4 lb

## 2023-10-30 DIAGNOSIS — D72829 Elevated white blood cell count, unspecified: Secondary | ICD-10-CM | POA: Diagnosis not present

## 2023-10-30 DIAGNOSIS — K219 Gastro-esophageal reflux disease without esophagitis: Secondary | ICD-10-CM

## 2023-10-30 DIAGNOSIS — M109 Gout, unspecified: Secondary | ICD-10-CM | POA: Diagnosis not present

## 2023-10-30 DIAGNOSIS — Z951 Presence of aortocoronary bypass graft: Secondary | ICD-10-CM | POA: Diagnosis not present

## 2023-10-30 DIAGNOSIS — E039 Hypothyroidism, unspecified: Secondary | ICD-10-CM | POA: Diagnosis not present

## 2023-10-30 DIAGNOSIS — I4821 Permanent atrial fibrillation: Secondary | ICD-10-CM

## 2023-10-30 DIAGNOSIS — I10 Essential (primary) hypertension: Secondary | ICD-10-CM | POA: Diagnosis not present

## 2023-10-30 DIAGNOSIS — N1831 Chronic kidney disease, stage 3a: Secondary | ICD-10-CM

## 2023-10-30 DIAGNOSIS — F339 Major depressive disorder, recurrent, unspecified: Secondary | ICD-10-CM

## 2023-10-30 DIAGNOSIS — I5022 Chronic systolic (congestive) heart failure: Secondary | ICD-10-CM

## 2023-10-30 DIAGNOSIS — E785 Hyperlipidemia, unspecified: Secondary | ICD-10-CM

## 2023-10-30 NOTE — Assessment & Plan Note (Signed)
 started Sertraline 08/21/23, now 50mg  every day, feels sleeps too much and depressed. May consider increase Sertraline to 75mg  every day for better mood control.

## 2023-10-30 NOTE — Assessment & Plan Note (Signed)
 no flare up over a year, R+L wrist, only takes Allopurinol, uric acid 7.6 10/10/22

## 2023-10-30 NOTE — Assessment & Plan Note (Signed)
 Bun/creat 37/1.62 10/14/23

## 2023-10-30 NOTE — Assessment & Plan Note (Signed)
 Hgb A1c 6.8 10/14/23, desires Dexcom to monitor CBG, unable to finger stick CBG check.

## 2023-10-30 NOTE — Assessment & Plan Note (Signed)
 wbc 13.2 10/14/23, CXR little effusion left. Positive for UTI, fully treated.

## 2023-10-30 NOTE — Assessment & Plan Note (Signed)
 blood pressure is controlled, on Losartan, Metoprolol, diuretics.

## 2023-10-30 NOTE — Assessment & Plan Note (Signed)
 on ASA, Atorvastatin, LDL 55 10/14/23

## 2023-10-30 NOTE — Assessment & Plan Note (Signed)
 Heart rate is in control, s/p pace maker, defibrillator , on Amiodarone, Pradaxa, Metoprolol, followed by cardiology.

## 2023-10-30 NOTE — Assessment & Plan Note (Signed)
 mild edema BLE, RLE>LLE, on Farxiga, Metoprolol, Metolazone, Spironolactone, Torsemide

## 2023-10-30 NOTE — Assessment & Plan Note (Signed)
 on Levothyroxine, TSH 3.27 10/14/23

## 2023-10-30 NOTE — Assessment & Plan Note (Signed)
 Hx of CABF, TAVR, no chest pain, taking Isosorbide

## 2023-10-30 NOTE — Progress Notes (Signed)
 Location:   Clinic [facility]   Place of Service:  Clinic (12) Provider: Arna Snipe Izaac Reisig NP  Nakaya Mishkin X, NP  Patient Care Team: Kathi Dohn X, NP as PCP - General (Internal Medicine) Kathleene Hazel, MD as PCP - Cardiology (Cardiology) Duke Salvia, MD as PCP - Electrophysiology (Cardiology) Jerilee Field, MD as Consulting Physician (Urology) Verner Chol, [facility] (Inactive) as Pharmacist (Pharmacist)  Extended Emergency Contact Information Primary Emergency Contact: [facility] Address: [address]          [place], [place] [address] [name] of [place] Phone: [phone] Relation: Daughter Secondary Emergency Contact: Hal Morales Address: [address]          [place], [place] [address] [name] of [place] Home Phone: [phone] Mobile Phone: [phone] Relation: Daughter  Code Status: DNR Goals of care: Advanced Directive information    10/30/2023    2:10 PM  Advanced Directives  Does Patient Have a Medical Advance Directive? Yes  Type of Estate agent of [employer];Living will  Does patient want to make changes to medical advance directive? No - Patient declined  Copy of Healthcare Power of Attorney in Chart? Yes - validated most recent copy scanned in chart (See row information)     Chief Complaint  Patient presents with  . Follow-up    2 week follow up and discuss eye exam and foot exam.    HPI:  Pt is a 89 y.o. male seen today for an acute visit for f/u CBGs   Leukocytosis, wbc 13.2 10/14/23, CXR little effusion left. Positive for UTI, fully treated.              Gout, no flare up over a year, R+L wrist, only takes Allopurinol, uric acid 7.6 10/10/22             Afib, s/p pace maker, defibrillator , on Amiodarone, Pradaxa, Metoprolol, followed by cardiology.              HLD on ASA, Atorvastatin, LDL 55 10/14/23             CAD, Hx of CABF, TAVR, no chest pain, taking Isosorbide             CHF, mild  edema BLE, RLE>LLE, on Farxiga, Metoprolol, Metolazone, Spironolactone, Torsemide             Hypothyroidism, on Levothyroxine, TSH 3.27 10/14/23             HTN, blood pressure is controlled, on Losartan, Metoprolol, diuretics.              GERD, stable, on Omeprazole, Hx of Reglan use             Depression, trying Effexor, failed Zoloft, feels sleeps too much and depressed. Too soon to eval.              CKD Bun/creat 37/1.62 10/14/23             T2DM, Hgb A1c 6.8 10/14/23, desires Dexcom to monitor CBG, unable to finger stick CBG check.      Past Medical History:  Diagnosis Date  . Age-related macular degeneration, dry, both eyes   . Allergic    "24/7; 365 days/year; I'm allergic to pollens, dust, all southern grasses/trees, mold, mildue, cats, dogs" (01/07/2017)  . Anal fissure   . Asthma    sees Dr. Kendrick Fries   . Benign prostatic hypertrophy    (sees Dr. Chester Holstein  . CAD (coronary artery disease)  a. s/p CABG 2007. b. Cath 08/2016 - 4/4 patent grafts.  . Carotid bruit    carotid u/s 10/10: 0.39% bilaterally  . Chronic combined systolic and diastolic CHF (congestive heart failure) (HCC)   . Complication of anesthesia 1980s   "w/anal cyst OR, he gave me a saddle block then put a narcotic in spinal cord; had a severe reaction to that" (01/07/2017)  . Congestive heart failure (CHF) (HCC)   . ED (erectile dysfunction)   . Family history of adverse reaction to anesthesia    "daughter wakes up during OR" (01/07/2017)  . GERD (gastroesophageal reflux disease)   . Gout   . HTN (hypertension)   . Hx of colonic polyps    (sees Dr. Marina Goodell)  . Hyperlipidemia   . Hypothyroidism   . Moderate to severe aortic stenosis    a. s/p TAVR 02/2017.  [name] Myocardial infarction (HCC) ~ 2000  . Obesity   . Osteoarthritis    "was in my knees, hands" (01/07/2017 )  . PAF (paroxysmal atrial fibrillation) (HCC)    a. documented post TAVR.  [name] Precancerous skin lesion    (sees Dr. Terri Piedra)  . Prostate cancer  (HCC) dx'd ~ 2014  . 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 ANUS SURGERY     "opened it back up cause it wouldn't heal; wound up w/a fissure" (01/07/2017)  . BIV ICD INSERTION CRT-D N/A 05/02/2017   Procedure: BIV ICD INSERTION CRT-D;  Surgeon: Duke Salvia, MD;  Location: [facility] INVASIVE CV LAB;  Service: Cardiovascular;  Laterality: N/A;  . CARDIAC CATHETERIZATION  10/29/2005  . CARDIAC CATHETERIZATION N/A 08/27/2016   Procedure: Right/Left Heart Cath and Coronary/Graft Angiography;  Surgeon: Kathleene Hazel, MD;  Location: [facility] INVASIVE CV LAB;  Service: Cardiovascular;  Laterality: N/A;  . CARDIOVERSION N/A 04/24/2021   Procedure: CARDIOVERSION;  Surgeon: Pricilla Riffle, MD;  Location: [facility] ENDOSCOPY;  Service: Cardiovascular;  Laterality: N/A;  . CARDIOVERSION N/A 11/08/2021   Procedure: CARDIOVERSION;  Surgeon: Chrystie Nose, MD;  Location: [facility] ENDOSCOPY;  Service: Cardiovascular;  Laterality: N/A;  . CARDIOVERSION N/A 02/06/2022   Procedure: CARDIOVERSION;  Surgeon: Wendall Stade, MD;  Location: [facility] ENDOSCOPY;  Service: Cardiovascular;  Laterality: N/A;  . CATARACT EXTRACTION W/ INTRAOCULAR LENS  IMPLANT, BILATERAL Bilateral   . CATARACT EXTRACTION, BILATERAL  2012  . COLONOSCOPY  06/30/2008   no repeats needed   . COLONOSCOPY     had 3 or 4 in the past   . CORONARY ARTERY BYPASS GRAFT  2007   "CABG X4"  . CYST EXCISION PERINEAL  1980s  . HAMMER TOE SURGERY Bilateral   . ICD GENERATOR CHANGEOUT N/A 05/30/2023   Procedure: ICD GENERATOR CHANGEOUT;  Surgeon: Duke Salvia, MD;  Location: [facility] INVASIVE CV LAB;  Service: Cardiovascular;  Laterality: N/A;  . JOINT REPLACEMENT    . KNEE ARTHROPLASTY  07/30/2011   Procedure: COMPUTER ASSISTED TOTAL KNEE ARTHROPLASTY;  Surgeon: Cammy Copa;  Location: [facility] OR;  Service: Orthopedics;  Laterality: Left;  left total knee arthroplasty  . MASTECTOMY SUBCUTANEOUS Bilateral   . MULTIPLE TOOTH EXTRACTIONS    . ORIF FINGER / THUMB FRACTURE Right ~ 1980   "repair of thumb injury"  . PROSTATE BIOPSY    . REPLACEMENT TOTAL KNEE BILATERAL  Bilateral 2012  . TEE WITHOUT CARDIOVERSION N/A 03/04/2017   Procedure: TRANSESOPHAGEAL ECHOCARDIOGRAM (TEE);  Surgeon: Kathleene Hazel, MD;  Location: [facility] OR;  Service: Open Heart Surgery;  Laterality: N/A;  . TOTAL KNEE REVISION Right 05/18/2019   Procedure: RIGHT PATELLA REVISION/REMOVAL;  Surgeon: Cammy Copa, MD;  Location: [facility] OR;  Service: Orthopedics;  Laterality: Right;  . TRANSCATHETER AORTIC VALVE REPLACEMENT, TRANSFEMORAL N/A 03/04/2017   Procedure: TRANSCATHETER AORTIC VALVE REPLACEMENT, TRANSFEMORAL;  Surgeon: Kathleene Hazel, MD;  Location: [facility] OR;  Service: Open Heart Surgery;  Laterality: N/A;    Allergies  Allergen Reactions  . Peanut-Containing Drug Products Anaphylaxis  . Sulfonamide Derivatives Anaphylaxis  . Amlodipine Swelling and Other (See Comments)    Swelling in ankles  . Eliquis [Apixaban] Other (See Comments)    Back/hip pain  . Lisinopril Cough  . Xarelto [Rivaroxaban] Other (See Comments)    Back/hip pain    Allergies as of 10/30/2023       Reactions   Peanut-containing Drug Products Anaphylaxis   Sulfonamide Derivatives Anaphylaxis   Amlodipine Swelling, Other (See Comments)   Swelling in ankles   Eliquis [apixaban] Other (See Comments)   Back/hip pain   Lisinopril Cough   Xarelto [rivaroxaban] Other (See Comments)   Back/hip pain        Medication List        Accurate as of October 30, 2023 11:59 PM. If you have any questions, ask your nurse or doctor.          albuterol 108 (90 Base) MCG/ACT inhaler Commonly known as: VENTOLIN HFA Inhale 2 puffs into the lungs every 4 (four) hours as needed for wheezing or shortness of  breath.   allopurinol 100 MG tablet Commonly known as: ZYLOPRIM Take 1 tablet (100 mg total) by mouth daily.   amiodarone 200 MG tablet Commonly known as: PACERONE Take 2 tablets by mouth twice daily x 2 weeks then decrease to 2 tablets by mouth daily x 2 weeks   aspirin 81 MG tablet Take 1 tablet (81 mg total) by mouth daily.   atorvastatin 20 MG tablet Commonly known as: LIPITOR TAKE ONE TABLET ONCE DAILY   benzonatate 200 MG capsule Commonly known as: TESSALON Take 1 capsule (200 mg total) by mouth 2 (two) times daily as needed for cough.   CAL MAG ZINC +D3 PO Take 2 tablets by mouth in the morning. 500 mg / 7 mg / 25 mcg   cetirizine 10 MG tablet Commonly known as: ZYRTEC Take 10 mg by mouth 2 (two) times daily.   colchicine 0.6 MG tablet Take 1 tablet (0.6 mg total) by mouth every 6 (six) hours as needed (gout).   dabigatran 150 MG Caps capsule Commonly known as: Pradaxa TAKE (1) CAPSULE TWICE DAILY.   Dexcom G6 Receiver Devi 1 Device by Does not apply route 3 (three) times daily. E11.69   Dexcom G6 Sensor Misc 1 Device by Does not apply route 3 (three) times daily. E11.69   Dexcom G6 Transmitter Misc 1 Device by Does not apply route 3 (three) times daily. E11.69   Farxiga 10 MG Tabs tablet Generic drug: dapagliflozin propanediol Take 10 mg by mouth daily before breakfast.   isosorbide mononitrate 30 MG 24 hr tablet Commonly known as: IMDUR Take 1 tablet (30 mg total) by mouth daily.   ketotifen 0.025 % ophthalmic solution Commonly known as: ZADITOR Place 1 drop into both eyes daily as needed (for itching).   levothyroxine 200 MCG  tablet Commonly known as: SYNTHROID Take 1 tablet (200 mcg total) by mouth daily.   losartan 25 MG tablet Commonly known as: Cozaar Take 0.5 tablets (12.5 mg total) by mouth daily.   methocarbamol 500 MG tablet Commonly known as: ROBAXIN Take 1 tablet (500 mg total) by mouth 4 (four) times daily as needed for muscle  spasms.   metoCLOPramide 10 MG tablet Commonly known as: REGLAN TAKE ONE TABLET BY MOUTH EVERY MORNING WITH BREAKFAST   metolazone 2.5 MG tablet Commonly known as: ZAROXOLYN Take 1 tablet (2.5 mg total) by mouth once a week every Saturday   metoprolol succinate 50 MG 24 hr tablet Commonly known as: TOPROL-XL Take 2 tablets (100 mg total) by mouth daily. Take with or immediately following a meal.   montelukast 10 MG tablet Commonly known as: SINGULAIR TAKE ONE TABLET BY MOUTH ONCE DAILY IN THE EVENING   Multi-Vitamin Gummies Chew Chew 2 tablets by mouth See admin instructions. Chew 2 gummies by mouth in the morning   PreserVision AREDS 2 Caps Take 1 capsule by mouth in the morning and at bedtime.   omeprazole 20 MG capsule Commonly known as: PRILOSEC TAKE ONE CAPSULE TWICE DAILY   pantoprazole 40 MG tablet Commonly known as: PROTONIX Take 1 tablet (40 mg total) by mouth daily.   potassium chloride SA 20 MEQ tablet Commonly known as: KLOR-CON M Take 1 tablet (20 mEq total) by mouth 2 (two) times daily.   sertraline 50 MG tablet Commonly known as: ZOLOFT Take 1 tablet (50 mg total) by mouth daily.   silver sulfADIAZINE 1 % cream Commonly known as: Silvadene Apply 1 Application topically 2 (two) times daily.   spironolactone 25 MG tablet Commonly known as: ALDACTONE Take 1 tablet (25 mg total) by mouth daily.   torsemide 20 MG tablet Commonly known as: DEMADEX Take 2 tablets (40 mg total) every other day, alternating with 1 tablet (20 mg total) every other day.   triamcinolone cream 0.1 % Commonly known as: KENALOG Apply 1 application  topically daily as needed for dry skin (affected areas).   valACYclovir 500 MG tablet Commonly known as: VALTREX Take 500 mg by mouth 2 (two) times daily as needed (cold sores).   venlafaxine XR 37.5 MG 24 hr capsule Commonly known as: Effexor XR Take 1 capsule (37.5 mg total) by mouth daily with breakfast.        Review  of Systems  Constitutional:  Negative for appetite change, fatigue and fever.  HENT:  Negative for congestion and trouble swallowing.   Eyes:  Negative for visual disturbance.  Respiratory:  Negative for cough, shortness of breath and wheezing.   Cardiovascular:  Positive for leg swelling. Negative for chest pain and palpitations.  Gastrointestinal:  Negative for abdominal pain and constipation.  Genitourinary:  Positive for frequency. Negative for difficulty urinating and urgency.       Nocturnal urination about every 2-3 hours.   Musculoskeletal:  Positive for arthralgias and gait problem.  Skin:  Negative for color change.  Neurological:  Negative for dizziness, speech difficulty and weakness.  Psychiatric/Behavioral:  Positive for dysphoric mood. Negative for confusion and sleep disturbance. The patient is not nervous/anxious.     Immunization History  Administered Date(s) Administered  . Fluad Quad(high Dose 65+) 05/06/2019, 05/30/2020, 06/18/2021, 05/27/2023  . Hep A / Hep B 04/06/2012, 10/07/2012  . Hepatitis B 05/07/2012  . Influenza Split 06/28/2013  . Influenza Whole 06/05/2007, 05/13/2008, 06/06/2009, 05/30/2010  . Influenza, High Dose Seasonal  PF 05/15/2018, 05/31/2022  . Influenza,inj,Quad PF,6+ Mos 05/20/2016  . Influenza-Unspecified 05/25/2014, 07/08/2015, 05/06/2017  . Moderna Covid-19 Vaccine Bivalent Booster 62yrs & up 05/31/2022  . PFIZER(Purple Top)SARS-COV-2 Vaccination 09/28/2019, 10/12/2019, 05/30/2020  . PPD Test 03/31/2023  . Research officer, trade [employer] 41yrs & up 11/18/2021  . Pneumococcal Conjugate-13 09/18/2015  . Pneumococcal Polysaccharide-23 05/13/2008, 05/25/2014  . Td 05/30/2010  . Tdap 10/27/2020  . Zoster Recombinant(Shingrix) 05/18/2018, 10/16/2018  . Zoster, Live 10/21/2007   Pertinent  Health Maintenance Due  Topic Date Due  . FOOT EXAM  Never done  . OPHTHALMOLOGY EXAM  Never done  . HEMOGLOBIN A1C  04/12/2024  . INFLUENZA  VACCINE  Completed      02/06/2022   11:26 AM 03/25/2022   11:20 AM 10/10/2022   11:28 AM 03/31/2023   10:48 AM 10/09/2023    1:31 PM  Fall Risk  Falls in the past year?  0 1 0 0  Was there an injury with Fall?  0 1 0 0  Was there an injury with Fall? - Comments   Back pain    Fall Risk Category Calculator  0 3 0 0  Fall Risk Category (Retired)  Low     (RETIRED) Patient Fall Risk Level High fall risk Low fall risk     Patient at Risk for Falls Due to  Medication side effect No Fall Risks No Fall Risks History of fall(s)  Fall risk Follow up  Falls evaluation completed;Education provided;Falls prevention discussed Falls evaluation completed Falls evaluation completed Falls evaluation completed   Functional Status Survey:    Vitals:   10/30/23 1410  BP: 118/66  Pulse: 83  Resp: 18  Temp: 97.8 F (36.6 C)  SpO2: 96%  Weight: 250 lb 6.4 oz (113.6 kg)  Height: 5\' 7"  (1.702 m)   Body mass index is 39.22 kg/m. Physical Exam Vitals and nursing note reviewed.  Constitutional:      Appearance: He is obese.  HENT:     Head: Normocephalic and atraumatic.     Ears:     Comments: Impacted cerumen removed with ear lavage.     Nose: Nose normal.  Eyes:     Extraocular Movements: Extraocular movements intact.     Conjunctiva/sclera: Conjunctivae normal.     Pupils: Pupils are equal, round, and reactive to light.  Cardiovascular:     Rate and Rhythm: Normal rate. Rhythm irregular.     Heart sounds: No murmur heard.    Comments: Left upper chest pace maker, defibrillator  Pulmonary:     Effort: Pulmonary effort is normal.     Breath sounds: Rales present.     Comments: Right base rales.  Abdominal:     General: Bowel sounds are normal.     Palpations: Abdomen is soft.     Tenderness: There is no abdominal tenderness.  Musculoskeletal:     Cervical back: Normal range of motion and neck supple.     Right lower leg: Edema present.     Left lower leg: Edema present.     Comments:  Trace edema BLE, R>L  Skin:    General: Skin is warm and dry.  Neurological:     General: No focal deficit present.     Mental Status: He is alert and oriented to person, place, and time. Mental status is at baseline.     Motor: No weakness.     Gait: Gait abnormal.  Psychiatric:        Mood  and Affect: Mood normal.        Behavior: Behavior normal.        Thought Content: Thought content normal.        Judgment: Judgment normal.     Comments: Stated he feels difficulty adjusting to [facility]     Labs reviewed: Recent Labs    06/30/23 1616 07/01/23 0355 07/02/23 0415 07/06/23 0357 07/18/23 1042 10/14/23 0729  NA  --  140   < > 136 140 134*  K  --  4.1   < > 3.4* 3.8 3.6  CL  --  106   < > 99 97 91*  CO2  --  24   < > 27 25 34*  GLUCOSE  --  125*   < > 113* 90 108*  BUN  --  14   < > 20 27 37*  CREATININE  --  1.30*   < > 1.44* 1.38* 1.62*  CALCIUM  --  7.8*   < > 8.2* 8.4* 8.4*  MG 2.7*  --   --   --   --   --   PHOS 2.3* 3.3  --   --   --   --    < > = values in this interval not displayed.   Recent Labs    02/17/23 1231 04/18/23 1141 06/30/23 1321 10/14/23 0729  AST 27 27 30 30   ALT 18 23 26 24   ALKPHOS 119 122* 132*  --   BILITOT 0.6 0.8 1.1 0.7  PROT 7.3 6.7 7.0 6.5  ALBUMIN 3.8 3.3* 2.7*  --    Recent Labs    06/30/23 1321 07/01/23 0355 10/14/23 0729  WBC 11.5* 11.9* 13.2*  NEUTROABS 8.9*  --  9,570*  HGB 12.5* 11.2* 13.7  HCT 41.6 36.9* 43.9  MCV 87.6 87.4 84.3  PLT 313 283 342   Lab Results  Component Value Date   TSH 3.27 10/14/2023   Lab Results  Component Value Date   HGBA1C 6.8 (H) 10/14/2023   Lab Results  Component Value Date   CHOL 108 10/14/2023   HDL 33 (L) 10/14/2023   LDLCALC 55 10/14/2023   TRIG 113 10/14/2023   CHOLHDL 3.3 10/14/2023    Significant Diagnostic Results in last 30 days:  CUP PACEART INCLINIC DEVICE CHECK Result Date: 10/24/2023 Normal in-clinic CRT-D check. Thresholds, sensing, impedance trend, and HF  diagnostics are within normal limits or stable for patient over time. AT/AF burden 100%.  Increased V rates on HG's.  Optivol/thoracic impedance suggests fluid on board.  Patient BiV pacing 75.8% of the time. Reviewed with Dr. Graciela Husbands.  Estimated longevity 7.3 YEARS . Pt enrolled in remote follow-up.Syliva Overman, RN  DG Chest 2 View Result Date: 10/21/2023 CLINICAL DATA:  Leukocytosis EXAM: CHEST - 2 VIEW COMPARISON:  X-ray 06/30/2023 FINDINGS: Sternal wires. Enlarged cardiopericardial silhouette. Status post TAVR. Left upper chest defibrillator. Tiny left pleural effusion. No pneumothorax, edema or consolidation. Degenerative changes along the spine. IMPRESSION: Postop chest.  Defibrillator.  Status post TAVR. Tiny left effusion. Electronically Signed   By: Karen Kays M.D.   On: 10/21/2023 13:08    Assessment/Plan: Leukocytosis wbc 13.2 10/14/23, CXR little effusion left. Positive for UTI, fully treated.   GOUT  no flare up over a year, R+L wrist, only takes Allopurinol, uric acid 7.6 10/10/22  Permanent atrial fibrillation (HCC) Heart rate is in control, s/p pace maker, defibrillator , on Amiodarone, Pradaxa, Metoprolol, followed by cardiology.   Hyperlipidemia  on ASA, Atorvastatin, LDL 55 10/14/23  Hx of CABG Hx of CABF, TAVR, no chest pain, taking Isosorbide  Systolic heart failure (HCC)  mild edema BLE, RLE>LLE, on Farxiga, Metoprolol, Metolazone, Spironolactone, Torsemide  Hypothyroidism on Levothyroxine, TSH 3.27 10/14/23  Essential hypertension  blood pressure is controlled, on Losartan, Metoprolol, diuretics.   Gastroesophageal reflux disease stable, on Omeprazole, Hx of Reglan use  Depression, recurrent (HCC) started Sertraline 08/21/23, now 50mg  every day, feels sleeps too much and depressed. May consider increase Sertraline to 75mg  every day for better mood control.   CKD (chronic kidney disease) stage 3, GFR 30-59 ml/min (HCC) Bun/creat 37/1.62 10/14/23  Type 2  diabetes mellitus (HCC)  Hgb A1c 6.8 10/14/23, desires Dexcom to monitor CBG, unable to finger stick CBG check.       Family/ staff Communication: plan of care reviewed with the patient, the patient's daughter.   Labs/tests ordered:  CMP/eGFR, CBC/ diff

## 2023-10-30 NOTE — Assessment & Plan Note (Signed)
 stable, on Omeprazole, Hx of Reglan use

## 2023-10-31 ENCOUNTER — Encounter: Payer: Self-pay | Admitting: Nurse Practitioner

## 2023-11-04 ENCOUNTER — Other Ambulatory Visit

## 2023-11-04 DIAGNOSIS — D72829 Elevated white blood cell count, unspecified: Secondary | ICD-10-CM | POA: Diagnosis not present

## 2023-11-04 LAB — CBC WITH DIFFERENTIAL/PLATELET
Absolute Lymphocytes: 1995 {cells}/uL (ref 850–3900)
Absolute Monocytes: 1410 {cells}/uL — ABNORMAL HIGH (ref 200–950)
Basophils Absolute: 90 {cells}/uL (ref 0–200)
Basophils Relative: 0.6 %
Eosinophils Absolute: 270 {cells}/uL (ref 15–500)
Eosinophils Relative: 1.8 %
HCT: 44.5 % (ref 38.5–50.0)
Hemoglobin: 13.9 g/dL (ref 13.2–17.1)
MCH: 26.9 pg — ABNORMAL LOW (ref 27.0–33.0)
MCHC: 31.2 g/dL — ABNORMAL LOW (ref 32.0–36.0)
MCV: 86.1 fL (ref 80.0–100.0)
MPV: 11.1 fL (ref 7.5–12.5)
Monocytes Relative: 9.4 %
Neutro Abs: 11235 {cells}/uL — ABNORMAL HIGH (ref 1500–7800)
Neutrophils Relative %: 74.9 %
Platelets: 416 10*3/uL — ABNORMAL HIGH (ref 140–400)
RBC: 5.17 10*6/uL (ref 4.20–5.80)
RDW: 16.5 % — ABNORMAL HIGH (ref 11.0–15.0)
Total Lymphocyte: 13.3 %
WBC: 15 10*3/uL — ABNORMAL HIGH (ref 3.8–10.8)

## 2023-11-06 ENCOUNTER — Encounter: Payer: Self-pay | Admitting: Nurse Practitioner

## 2023-11-06 ENCOUNTER — Ambulatory Visit: Admitting: Nurse Practitioner

## 2023-11-06 ENCOUNTER — Ambulatory Visit: Payer: Self-pay | Admitting: Nurse Practitioner

## 2023-11-06 VITALS — BP 120/60 | HR 92 | Temp 97.7°F | Ht 67.0 in | Wt 249.0 lb

## 2023-11-06 DIAGNOSIS — F339 Major depressive disorder, recurrent, unspecified: Secondary | ICD-10-CM

## 2023-11-06 DIAGNOSIS — E039 Hypothyroidism, unspecified: Secondary | ICD-10-CM

## 2023-11-06 DIAGNOSIS — N1831 Chronic kidney disease, stage 3a: Secondary | ICD-10-CM

## 2023-11-06 DIAGNOSIS — K219 Gastro-esophageal reflux disease without esophagitis: Secondary | ICD-10-CM

## 2023-11-06 DIAGNOSIS — E785 Hyperlipidemia, unspecified: Secondary | ICD-10-CM | POA: Diagnosis not present

## 2023-11-06 DIAGNOSIS — Z951 Presence of aortocoronary bypass graft: Secondary | ICD-10-CM

## 2023-11-06 DIAGNOSIS — I509 Heart failure, unspecified: Secondary | ICD-10-CM | POA: Diagnosis not present

## 2023-11-06 DIAGNOSIS — I10 Essential (primary) hypertension: Secondary | ICD-10-CM | POA: Diagnosis not present

## 2023-11-06 DIAGNOSIS — J40 Bronchitis, not specified as acute or chronic: Secondary | ICD-10-CM

## 2023-11-06 DIAGNOSIS — I4821 Permanent atrial fibrillation: Secondary | ICD-10-CM | POA: Diagnosis not present

## 2023-11-06 DIAGNOSIS — M109 Gout, unspecified: Secondary | ICD-10-CM | POA: Diagnosis not present

## 2023-11-06 NOTE — Telephone Encounter (Signed)
 Chief Complaint: Cough Symptoms: runny nose, mild crackles Frequency: Worsening x 3 days, ongoing x 1.5 wks Pertinent Negatives: Patient denies fever, SOB,  Disposition: [] ED /[] Urgent Care (no appt availability in office) / [x] Appointment(In office/virtual)/ []  [facility]/ [] Home Care/ [] Refused Recommended Disposition /[] [place] [facility]/ []  Follow-up with PCP Additional Notes: [name] reports pt has had ongoing cough for about 1.5 weeks that has worsened over the last 3 days. Cough is dry but she reports milk crackles, runny nose. Denies fever, worsening SOB. Pt has history of CHF, frequent hospitalization per dtr. [facility] visit scheduled today, spoke to CAL to confirm schedule. This RN educated pt on home care, new-worsening symptoms, when to call back/seek emergent care. Pt verbalized understanding and agrees to plan.    Copied from CRM [id number]. Topic: Clinical - Red Word Triage >> Nov 06, 2023  9:34 AM Maree Krabbe H wrote: Red Word that prompted transfer to Nurse Triage: Patients daughter called in because her dad has a cough, it has gotten worse over the last two days and he is known for congestive heart failure. Reason for Disposition  SEVERE coughing spells (e.g., whooping sound after coughing, vomiting after coughing)  Answer Assessment - Initial Assessment Questions 1. ONSET: "When did the cough begin?"      About 1.5 wks ago, worsening x3 days 2. SEVERITY: "How bad is the cough today?"      "Episodes" 3. SPUTUM: "Describe the color of your sputum" (none, dry cough; clear, white, yellow, green)     None 4. HEMOPTYSIS: "Are you coughing up any blood?" If so ask: "How much?" (flecks, streaks, tablespoons, etc.)     None 5. DIFFICULTY BREATHING: "Are you having difficulty breathing?" If Yes, ask: "How bad is it?" (e.g., mild, moderate, severe)    - MILD: No SOB at rest, mild SOB with walking, speaks normally in sentences, can lie down, no retractions, pulse <  100.    - MODERATE: SOB at rest, SOB with minimal exertion and prefers to sit, cannot lie down flat, speaks in phrases, mild retractions, audible wheezing, pulse 100-120.    - SEVERE: Very SOB at rest, speaks in single words, struggling to breathe, sitting hunched forward, retractions, pulse > 120      Ongoing, no worse than baseline 6. FEVER: "Do you have a fever?" If Yes, ask: "What is your temperature, how was it measured, and when did it start?"     None 7. CARDIAC HISTORY: "Do you have any history of heart disease?" (e.g., heart attack, congestive heart failure)      CHF, heart attack, pacer/defib, afib 8. LUNG HISTORY: "Do you have any history of lung disease?"  (e.g., pulmonary embolus, asthma, emphysema)     None 10. OTHER SYMPTOMS: "Do you have any other symptoms?" (e.g., runny nose, wheezing, chest pain)       Mild crackles, runny nose  Protocols used: Cough - Acute Non-Productive-A-AH

## 2023-11-06 NOTE — Assessment & Plan Note (Signed)
 Hx of CABF, TAVR, no chest pain, taking Isosorbide

## 2023-11-06 NOTE — Telephone Encounter (Signed)
 I believe this is you all patient, not ours

## 2023-11-06 NOTE — Assessment & Plan Note (Signed)
 no flare up over a year, R+L wrist, only takes Allopurinol, uric acid 7.6 10/10/22

## 2023-11-06 NOTE — Assessment & Plan Note (Signed)
 s/p pace maker, defibrillator , on Amiodarone, Pradaxa, Metoprolol, followed by cardiology.

## 2023-11-06 NOTE — Assessment & Plan Note (Signed)
 stable, on Omeprazole, Hx of Reglan use

## 2023-11-06 NOTE — Assessment & Plan Note (Signed)
 trying Effexor, failed Zoloft, feels sleeps too much and depressed. Too soon to eval.

## 2023-11-06 NOTE — Telephone Encounter (Signed)
 I called patient daughter [name] and she states that he got tested for Covid and Flu by Rosey Bath at [facility] today and results were negative. Message routed to PCP Mast, Man X, NP and Medical Assistant assigned to work with her today as Financial planner.

## 2023-11-06 NOTE — Assessment & Plan Note (Signed)
 on ASA, Atorvastatin, LDL 55 10/14/23

## 2023-11-06 NOTE — Assessment & Plan Note (Signed)
 Worsened cough, 10/21/23 CXR Postop chest.  Defibrillator.  Status post TAVR. Tiny left effusion.  Leukocytosis, wbc 13.2 10/14/23<<15.0 11/04/23 with neutrophils 74.9%, CXR little effusion left. Positive for UTI, fully treated.   Atrovent nasal spray prn, Augmentin 875mg  12hr x 7 days, Tessalon 100mg  tid prn.

## 2023-11-06 NOTE — Assessment & Plan Note (Signed)
 Hgb A1c 6.8 10/14/23, desires Dexcom to monitor CBG, unable to finger stick CBG check.

## 2023-11-06 NOTE — Assessment & Plan Note (Signed)
 blood pressure is controlled, on Losartan, Metoprolol, diuretics.

## 2023-11-06 NOTE — Assessment & Plan Note (Signed)
 on Levothyroxine, TSH 3.27 10/14/23

## 2023-11-06 NOTE — Assessment & Plan Note (Signed)
 Bun/creat 37/1.62 10/14/23

## 2023-11-06 NOTE — Progress Notes (Signed)
 Location:   [facility]   Place of Service:    Provider: Chipper Oman NP  Etoile Looman X, NP  Patient Care Team: Ioma Chismar X, NP as PCP - General (Internal Medicine) Kathleene Hazel, MD as PCP - Cardiology (Cardiology) Duke Salvia, MD as PCP - Electrophysiology (Cardiology) Jerilee Field, MD as Consulting Physician (Urology) Verner Chol, [facility] (Inactive) as Pharmacist (Pharmacist) Maris Berger, MD as Consulting Physician (Ophthalmology)  Extended Emergency Contact Information Primary Emergency Contact: [facility] Address: [address]          [place], [place] [address] [name] of [place] Phone: [phone] Relation: Daughter Secondary Emergency Contact: Hal Morales Address: [address]          [place], [place] [address] [name] of [place] Home Phone: [phone] Mobile Phone: [phone] Relation: Daughter  Code Status: DNR Goals of care: Advanced Directive information    10/30/2023    2:10 PM  Advanced Directives  Does Patient Have a Medical Advance Directive? Yes  Type of Estate agent of [place];Living will  Does patient want to make changes to medical advance directive? No - Patient declined  Copy of Healthcare Power of Attorney in Chart? Yes - validated most recent copy scanned in chart (See row information)     Chief Complaint  Patient presents with   Cough    Ongoing productive cough (clear), patient was recently tested for covid and flu and both were negative. Here with daughter [name]. Last eye exam requested from Dr.McCuen. Patient with episode of dizziness/light-headed last night and b/p running on the lower side of normal. Discuss status of Prior Auth for Dexacom     HPI:  Pt is a 89 y.o. male seen today for an acute visit for worsened cough,   Worsened cough, 10/21/23 CXR Postop chest.  Defibrillator.  Status post TAVR. Tiny left effusion.  Leukocytosis, wbc 13.2 10/14/23<<15.0  11/04/23 with neutrophils 74.9%, CXR little effusion left. Positive for UTI, fully treated.              Gout, no flare up over a year, R+L wrist, only takes Allopurinol, uric acid 7.6 10/10/22             Afib, s/p pace maker, defibrillator , on Amiodarone, Pradaxa, Metoprolol, followed by cardiology.              HLD on ASA, Atorvastatin, LDL 55 10/14/23             CAD, Hx of CABF, TAVR, no chest pain, taking Isosorbide             CHF, mild edema BLE, RLE>LLE, on Farxiga, Metoprolol, Metolazone, Spironolactone, Torsemide             Hypothyroidism, on Levothyroxine, TSH 3.27 10/14/23             HTN, blood pressure is controlled, on Losartan, Metoprolol, diuretics.              GERD, stable, on Omeprazole, Hx of Reglan use             Depression, trying Effexor, failed Zoloft, feels sleeps too much and depressed. Too soon to eval.              CKD Bun/creat 37/1.62 10/14/23             T2DM, Hgb A1c 6.8 10/14/23, desires Dexcom to monitor CBG, unable to finger stick CBG check.      Past Medical History:  Diagnosis Date   Age-related macular degeneration, dry, both eyes    Allergic    "24/7; 365 days/year; I'm allergic to pollens, dust, all southern grasses/trees, mold, mildue, cats, dogs" (01/07/2017)   Anal fissure    Asthma    sees Dr. Kendrick Fries    Benign prostatic hypertrophy    (sees Dr. Chester Holstein   CAD (coronary artery disease)    a. s/p CABG 2007. b. Cath 08/2016 - 4/4 patent grafts.   Carotid bruit    carotid u/s 10/10: 0.39% bilaterally   Chronic combined systolic and diastolic CHF (congestive heart failure) (HCC)    Complication of anesthesia 1980s   "w/anal cyst OR, he gave me a saddle block then put a narcotic in spinal cord; had a severe reaction to that" (01/07/2017)   Congestive heart failure (CHF) [facility])    ED (erectile dysfunction)    Family history of adverse reaction to anesthesia    "daughter wakes up during OR" (01/07/2017)   GERD (gastroesophageal reflux disease)     Gout    HTN (hypertension)    Hx of colonic polyps    (sees Dr. Marina Goodell)   Hyperlipidemia    Hypothyroidism    Moderate to severe aortic stenosis    a. s/p TAVR 02/2017.   Myocardial infarction [facility]) ~ 2000   Obesity    Osteoarthritis    "was in my knees, hands" (01/07/2017 )   PAF (paroxysmal atrial fibrillation) (HCC)    a. documented post TAVR.   Precancerous skin lesion    (sees Dr. 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   29 mm Edwards Sapien 3 transcatheter heart valve placed via percutaneous right transfemoral approach   Past Surgical History:  Procedure Laterality Date   ANUS SURGERY     "opened it back up cause it wouldn't heal; wound up w/a fissure" (01/07/2017)   BIV ICD INSERTION CRT-D N/A 05/02/2017   Procedure: BIV ICD INSERTION CRT-D;  Surgeon: Duke Salvia, MD;  Location: [facility] INVASIVE CV LAB;  Service: Cardiovascular;  Laterality: N/A;   CARDIAC CATHETERIZATION  10/29/2005   CARDIAC CATHETERIZATION N/A 08/27/2016   Procedure: Right/Left Heart Cath and Coronary/Graft Angiography;  Surgeon: Kathleene Hazel, MD;  Location: [facility] INVASIVE CV LAB;  Service: Cardiovascular;  Laterality: N/A;   CARDIOVERSION N/A 04/24/2021   Procedure: CARDIOVERSION;  Surgeon: Pricilla Riffle, MD;  Location: [facility] ENDOSCOPY;  Service: Cardiovascular;  Laterality: N/A;   CARDIOVERSION N/A 11/08/2021   Procedure: CARDIOVERSION;  Surgeon: Chrystie Nose, MD;  Location: [facility] ENDOSCOPY;  Service: Cardiovascular;  Laterality: N/A;   CARDIOVERSION N/A 02/06/2022   Procedure: CARDIOVERSION;  Surgeon: Wendall Stade, MD;  Location: [facility] ENDOSCOPY;  Service: Cardiovascular;  Laterality: N/A;   CATARACT EXTRACTION W/ INTRAOCULAR LENS  IMPLANT, BILATERAL Bilateral    CATARACT EXTRACTION, BILATERAL  2012   COLONOSCOPY  06/30/2008   no repeats needed    COLONOSCOPY     had 3 or 4 in the past    CORONARY ARTERY BYPASS  GRAFT  2007   "CABG X4"   CYST EXCISION PERINEAL  1980s   HAMMER TOE SURGERY Bilateral    ICD GENERATOR CHANGEOUT N/A 05/30/2023   Procedure: ICD GENERATOR CHANGEOUT;  Surgeon: Duke Salvia, MD;  Location: [facility] INVASIVE CV LAB;  Service: Cardiovascular;  Laterality: N/A;   JOINT REPLACEMENT     KNEE ARTHROPLASTY  07/30/2011  Procedure: COMPUTER ASSISTED TOTAL KNEE ARTHROPLASTY;  Surgeon: Cammy Copa;  Location: [facility] OR;  Service: Orthopedics;  Laterality: Left;  left total knee arthroplasty   MASTECTOMY SUBCUTANEOUS Bilateral    MULTIPLE TOOTH EXTRACTIONS     ORIF FINGER / THUMB FRACTURE Right ~ 1980   "repair of thumb injury"   PROSTATE BIOPSY     REPLACEMENT TOTAL KNEE BILATERAL Bilateral 2012   TEE WITHOUT CARDIOVERSION N/A 03/04/2017   Procedure: TRANSESOPHAGEAL ECHOCARDIOGRAM (TEE);  Surgeon: Kathleene Hazel, MD;  Location: [facility] OR;  Service: Open Heart Surgery;  Laterality: N/A;   TOTAL KNEE REVISION Right 05/18/2019   Procedure: RIGHT PATELLA REVISION/REMOVAL;  Surgeon: Cammy Copa, MD;  Location: [facility] OR;  Service: Orthopedics;  Laterality: Right;   TRANSCATHETER AORTIC VALVE REPLACEMENT, TRANSFEMORAL N/A 03/04/2017   Procedure: TRANSCATHETER AORTIC VALVE REPLACEMENT, TRANSFEMORAL;  Surgeon: Kathleene Hazel, MD;  Location: [facility] OR;  Service: Open Heart Surgery;  Laterality: N/A;    Allergies  Allergen Reactions   Peanut-Containing Drug Products Anaphylaxis   Sulfonamide Derivatives Anaphylaxis   Amlodipine Swelling and Other (See Comments)    Swelling in ankles   Eliquis [Apixaban] Other (See Comments)    Back/hip pain   Lisinopril Cough   Xarelto [Rivaroxaban] Other (See Comments)    Back/hip pain    Allergies as of 11/06/2023       Reactions   Peanut-containing Drug Products Anaphylaxis   Sulfonamide Derivatives Anaphylaxis   Amlodipine Swelling, Other (See Comments)   Swelling in ankles   Eliquis [apixaban] Other (See Comments)   Back/hip pain    Lisinopril Cough   Xarelto [rivaroxaban] Other (See Comments)   Back/hip pain        Medication List        Accurate as of November 06, 2023  2:27 PM. If you have any questions, ask your nurse or doctor.          albuterol 108 (90 Base) MCG/ACT inhaler Commonly known as: VENTOLIN HFA Inhale 2 puffs into the lungs every 4 (four) hours as needed for wheezing or shortness of breath.   allopurinol 100 MG tablet Commonly known as: ZYLOPRIM Take 1 tablet (100 mg total) by mouth daily.   amiodarone 200 MG tablet Commonly known as: PACERONE Take 2 tablets by mouth twice daily x 2 weeks then decrease to 2 tablets by mouth daily x 2 weeks   aspirin 81 MG tablet Take 1 tablet (81 mg total) by mouth daily.   atorvastatin 20 MG tablet Commonly known as: LIPITOR TAKE ONE TABLET ONCE DAILY   benzonatate 200 MG capsule Commonly known as: TESSALON Take 1 capsule (200 mg total) by mouth 2 (two) times daily as needed for cough.   CAL MAG ZINC +D3 PO Take 2 tablets by mouth in the morning. 500 mg / 7 mg / 25 mcg   cetirizine 10 MG tablet Commonly known as: ZYRTEC Take 10 mg by mouth 2 (two) times daily.   colchicine 0.6 MG tablet Take 1 tablet (0.6 mg total) by mouth every 6 (six) hours as needed (gout).   dabigatran 150 MG Caps capsule Commonly known as: Pradaxa TAKE (1) CAPSULE TWICE DAILY.   Dexcom G6 Receiver Devi 1 Device by Does not apply route 3 (three) times daily. E11.69   Dexcom G6 Sensor Misc 1 Device by Does not apply route 3 (three) times daily. E11.69   Dexcom G6 Transmitter Misc 1 Device by Does not apply route 3 (three) times daily. E11.69  Farxiga 10 MG Tabs tablet Generic drug: dapagliflozin propanediol Take 10 mg by mouth daily before breakfast.   isosorbide mononitrate 30 MG 24 hr tablet Commonly known as: IMDUR Take 1 tablet (30 mg total) by mouth daily.   ketotifen 0.025 % ophthalmic solution Commonly known as: ZADITOR Place 1 drop into both  eyes daily as needed (for itching).   levothyroxine 200 MCG tablet Commonly known as: SYNTHROID Take 1 tablet (200 mcg total) by mouth daily.   losartan 25 MG tablet Commonly known as: Cozaar Take 0.5 tablets (12.5 mg total) by mouth daily.   magic mouthwash Soln Take 5 mLs by mouth as directed. Suspension contains equal amounts of Maalox Extra Strength, nystatin, and diphenhydramine. RX'ed by dentist   methocarbamol 500 MG tablet Commonly known as: ROBAXIN Take 1 tablet (500 mg total) by mouth 4 (four) times daily as needed for muscle spasms.   metoCLOPramide 10 MG tablet Commonly known as: REGLAN TAKE ONE TABLET BY MOUTH EVERY MORNING WITH BREAKFAST   metolazone 2.5 MG tablet Commonly known as: ZAROXOLYN Take 1 tablet (2.5 mg total) by mouth once a week every Saturday   metoprolol succinate 50 MG 24 hr tablet Commonly known as: TOPROL-XL Take 2 tablets (100 mg total) by mouth daily. Take with or immediately following a meal.   montelukast 10 MG tablet Commonly known as: SINGULAIR TAKE ONE TABLET BY MOUTH ONCE DAILY IN THE EVENING   Multi-Vitamin Gummies Chew Chew 2 tablets by mouth See admin instructions. Chew 2 gummies by mouth in the morning   PreserVision AREDS 2 Caps Take 1 capsule by mouth in the morning and at bedtime.   omeprazole 20 MG capsule Commonly known as: PRILOSEC TAKE ONE CAPSULE TWICE DAILY   pantoprazole 40 MG tablet Commonly known as: PROTONIX Take 1 tablet (40 mg total) by mouth daily.   potassium chloride SA 20 MEQ tablet Commonly known as: KLOR-CON M Take 1 tablet (20 mEq total) by mouth 2 (two) times daily.   sertraline 50 MG tablet Commonly known as: ZOLOFT Take 1 tablet (50 mg total) by mouth daily.   silver sulfADIAZINE 1 % cream Commonly known as: Silvadene Apply 1 Application topically 2 (two) times daily.   spironolactone 25 MG tablet Commonly known as: ALDACTONE Take 1 tablet (25 mg total) by mouth daily.   torsemide 20 MG  tablet Commonly known as: DEMADEX Take 2 tablets (40 mg total) every other day, alternating with 1 tablet (20 mg total) every other day.   triamcinolone cream 0.1 % Commonly known as: KENALOG Apply 1 application  topically daily as needed for dry skin (affected areas).   valACYclovir 500 MG tablet Commonly known as: VALTREX Take 500 mg by mouth 2 (two) times daily as needed (cold sores).   venlafaxine XR 37.5 MG 24 hr capsule Commonly known as: Effexor XR Take 1 capsule (37.5 mg total) by mouth daily with breakfast.        Review of Systems  Constitutional:  Negative for appetite change, fatigue and fever.  HENT:  Negative for congestion and trouble swallowing.   Eyes:  Negative for visual disturbance.  Respiratory:  Positive for cough. Negative for shortness of breath and wheezing.   Cardiovascular:  Positive for leg swelling. Negative for chest pain and palpitations.  Gastrointestinal:  Negative for abdominal pain and constipation.  Genitourinary:  Positive for frequency. Negative for difficulty urinating and urgency.       Nocturnal urination about every 2-3 hours.   Musculoskeletal:  Positive for arthralgias and gait problem.  Skin:  Negative for color change.  Neurological:  Negative for dizziness, speech difficulty and weakness.  Psychiatric/Behavioral:  Positive for dysphoric mood. Negative for confusion and sleep disturbance. The patient is not nervous/anxious.     Immunization History  Administered Date(s) Administered   Fluad Quad(high Dose 65+) 05/06/2019, 05/30/2020, 06/18/2021, 05/27/2023   Hep A / Hep B 04/06/2012, 10/07/2012   Hepatitis B 05/07/2012   Influenza Split 06/28/2013   Influenza Whole 06/05/2007, 05/13/2008, 06/06/2009, 05/30/2010   Influenza, High Dose Seasonal PF 05/15/2018, 05/31/2022   Influenza,inj,Quad PF,6+ Mos 05/20/2016   Influenza-Unspecified 05/25/2014, 07/08/2015, 05/06/2017   Moderna Covid-19 Vaccine Bivalent Booster 17yrs & up  05/31/2022   PFIZER(Purple Top)SARS-COV-2 Vaccination 09/28/2019, 10/12/2019, 05/30/2020   PPD Test 03/31/2023   Pfizer Covid-19 Vaccine Bivalent Booster 9yrs & up 11/18/2021   Pneumococcal Conjugate-13 09/18/2015   Pneumococcal Polysaccharide-23 05/13/2008, 05/25/2014   Td 05/30/2010   Tdap 10/27/2020   Zoster Recombinant(Shingrix) 05/18/2018, 10/16/2018   Zoster, Live 10/21/2007   Pertinent  Health Maintenance Due  Topic Date Due   FOOT EXAM  Never done   OPHTHALMOLOGY EXAM  Never done   HEMOGLOBIN A1C  04/12/2024   INFLUENZA VACCINE  Completed      02/06/2022   11:26 AM 03/25/2022   11:20 AM 10/10/2022   11:28 AM 03/31/2023   10:48 AM 10/09/2023    1:31 PM  Fall Risk  Falls in the past year?  0 1 0 0  Was there an injury with Fall?  0 1 0 0  Was there an injury with Fall? - Comments   Back pain    Fall Risk Category Calculator  0 3 0 0  Fall Risk Category (Retired)  Low     (RETIRED) Patient Fall Risk Level High fall risk Low fall risk     Patient at Risk for Falls Due to  Medication side effect No Fall Risks No Fall Risks History of fall(s)  Fall risk Follow up  Falls evaluation completed;Education provided;Falls prevention discussed Falls evaluation completed Falls evaluation completed Falls evaluation completed   Functional Status Survey:    Vitals:   11/06/23 1135  BP: 120/60  Pulse: 92  Temp: 97.7 F (36.5 C)  TempSrc: Temporal  SpO2: 95%  Weight: 249 lb (112.9 kg)  Height: 5\' 7"  (1.702 m)   Body mass index is 39 kg/m. Physical Exam Vitals and nursing note reviewed.  Constitutional:      Appearance: He is obese.  HENT:     Head: Normocephalic and atraumatic.     Nose: Nose normal.  Eyes:     Extraocular Movements: Extraocular movements intact.     Conjunctiva/sclera: Conjunctivae normal.     Pupils: Pupils are equal, round, and reactive to light.  Cardiovascular:     Rate and Rhythm: Normal rate. Rhythm irregular.     Heart sounds: No murmur heard.     Comments: Left upper chest pace maker, defibrillator  Pulmonary:     Effort: Pulmonary effort is normal.     Breath sounds: Rales present.     Comments: Right base rales.  Abdominal:     General: Bowel sounds are normal.     Palpations: Abdomen is soft.     Tenderness: There is no abdominal tenderness.  Musculoskeletal:     Cervical back: Normal range of motion and neck supple.     Right lower leg: Edema present.     Left lower leg: Edema present.  Comments: Trace edema BLE, R>L  Skin:    General: Skin is warm and dry.  Neurological:     General: No focal deficit present.     Mental Status: He is alert and oriented to person, place, and time. Mental status is at baseline.     Motor: No weakness.     Gait: Gait abnormal.  Psychiatric:        Mood and Affect: Mood normal.        Behavior: Behavior normal.        Thought Content: Thought content normal.        Judgment: Judgment normal.     Comments: Stated he feels difficulty adjusting to [facility]      Labs reviewed: Recent Labs    06/30/23 1616 07/01/23 0355 07/02/23 0415 07/06/23 0357 07/18/23 1042 10/14/23 0729  NA  --  140   < > 136 140 134*  K  --  4.1   < > 3.4* 3.8 3.6  CL  --  106   < > 99 97 91*  CO2  --  24   < > 27 25 34*  GLUCOSE  --  125*   < > 113* 90 108*  BUN  --  14   < > 20 27 37*  CREATININE  --  1.30*   < > 1.44* 1.38* 1.62*  CALCIUM  --  7.8*   < > 8.2* 8.4* 8.4*  MG 2.7*  --   --   --   --   --   PHOS 2.3* 3.3  --   --   --   --    < > = values in this interval not displayed.   Recent Labs    02/17/23 1231 04/18/23 1141 06/30/23 1321 10/14/23 0729  AST 27 27 30 30   ALT 18 23 26 24   ALKPHOS 119 122* 132*  --   BILITOT 0.6 0.8 1.1 0.7  PROT 7.3 6.7 7.0 6.5  ALBUMIN 3.8 3.3* 2.7*  --    Recent Labs    06/30/23 1321 07/01/23 0355 10/14/23 0729 11/04/23 0728  WBC 11.5* 11.9* 13.2* 15.0*  NEUTROABS 8.9*  --  9,570* 11,235*  HGB 12.5* 11.2* 13.7 13.9  HCT 41.6 36.9* 43.9 44.5   MCV 87.6 87.4 84.3 86.1  PLT 313 283 342 416*   Lab Results  Component Value Date   TSH 3.27 10/14/2023   Lab Results  Component Value Date   HGBA1C 6.8 (H) 10/14/2023   Lab Results  Component Value Date   CHOL 108 10/14/2023   HDL 33 (L) 10/14/2023   LDLCALC 55 10/14/2023   TRIG 113 10/14/2023   CHOLHDL 3.3 10/14/2023    Significant Diagnostic Results in last 30 days:  CUP PACEART INCLINIC DEVICE CHECK Result Date: 10/24/2023 Normal in-clinic CRT-D check. Thresholds, sensing, impedance trend, and HF diagnostics are within normal limits or stable for patient over time. AT/AF burden 100%.  Increased V rates on HG's.  Optivol/thoracic impedance suggests fluid on board.  Patient BiV pacing 75.8% of the time. Reviewed with Dr. Graciela Husbands.  Estimated longevity 7.3 YEARS . Pt enrolled in remote follow-up.Syliva Overman, RN  DG Chest 2 View Result Date: 10/21/2023 CLINICAL DATA:  Leukocytosis EXAM: CHEST - 2 VIEW COMPARISON:  X-ray 06/30/2023 FINDINGS: Sternal wires. Enlarged cardiopericardial silhouette. Status post TAVR. Left upper chest defibrillator. Tiny left pleural effusion. No pneumothorax, edema or consolidation. Degenerative changes along the spine. IMPRESSION: Postop chest.  Defibrillator.  Status  post TAVR. Tiny left effusion. Electronically Signed   By: Karen Kays M.D.   On: 10/21/2023 13:08    Assessment/Plan: Bronchitis Worsened cough, 10/21/23 CXR Postop chest.  Defibrillator.  Status post TAVR. Tiny left effusion.  Leukocytosis, wbc 13.2 10/14/23<<15.0 11/04/23 with neutrophils 74.9%, CXR little effusion left. Positive for UTI, fully treated.   Atrovent nasal spray prn, Augmentin 875mg  12hr x 7 days, Tessalon 100mg  tid prn.   GOUT no flare up over a year, R+L wrist, only takes Allopurinol, uric acid 7.6 10/10/22  Permanent atrial fibrillation (HCC)  s/p pace maker, defibrillator , on Amiodarone, Pradaxa, Metoprolol, followed by cardiology.   Hyperlipidemia on ASA,  Atorvastatin, LDL 55 10/14/23  Hx of CABG Hx of CABF, TAVR, no chest pain, taking Isosorbide  Chronic congestive heart failure (HCC)  mild edema BLE, RLE>LLE, on Farxiga, Metoprolol, Metolazone, Spironolactone, Torsemide  Hypothyroidism  on Levothyroxine, TSH 3.27 10/14/23  Essential hypertension blood pressure is controlled, on Losartan, Metoprolol, diuretics.   Gastroesophageal reflux disease stable, on Omeprazole, Hx of Reglan use  Depression, recurrent (HCC) trying Effexor, failed Zoloft, feels sleeps too much and depressed. Too soon to eval.   CKD (chronic kidney disease) stage 3, GFR 30-59 ml/min (HCC) Bun/creat 37/1.62 10/14/23  Type 2 diabetes mellitus (HCC) Hgb A1c 6.8 10/14/23, desires Dexcom to monitor CBG, unable to finger stick CBG check.     Family/ staff Communication: plan of care reviewed with the patient and the patient's daughter  Labs/tests ordered:  none

## 2023-11-06 NOTE — Assessment & Plan Note (Signed)
 mild edema BLE, RLE>LLE, on Farxiga, Metoprolol, Metolazone, Spironolactone, Torsemide

## 2023-11-07 ENCOUNTER — Telehealth: Payer: Self-pay | Admitting: Nurse Practitioner

## 2023-11-07 ENCOUNTER — Telehealth: Payer: Self-pay

## 2023-11-07 MED ORDER — AMOXICILLIN-POT CLAVULANATE 875-125 MG PO TABS: 1.0000 | ORAL_TABLET | Freq: Two times a day (BID) | ORAL | 0 refills | Status: DC

## 2023-11-07 NOTE — Telephone Encounter (Signed)
 Copied from CRM [id number]. Topic: Clinical - Prescription Issue >> Nov 07, 2023  4:38 PM Antony Haste wrote: Reason for CRM: Justin [name], the patient's daughter states she contacted her father's pharmacy in regards to an antibiotic she was advised would be called in for his chest congestion and cough. She states during his acute visit yesterday, he was advised to take this antibiotic over the weekend and if his symptoms continued he would start Prednisone. [name] states the pharmacy has not received the order for his antibiotic, she is needing this called-in before the weekend?  Callback #:[phone]

## 2023-11-07 NOTE — Telephone Encounter (Signed)
 Pt was see on 11/06/2023 and told to start augmentin however medication was not sent to pharmacy, daughter called on call about getting antibiotic called into pharmacy so Mr Morrone could start medication. Rx [facility].

## 2023-11-10 ENCOUNTER — Institutional Professional Consult (permissible substitution): Admitting: Sleep Medicine

## 2023-11-10 ENCOUNTER — Ambulatory Visit: Payer: Medicare Other | Attending: Internal Medicine

## 2023-11-10 DIAGNOSIS — Z95810 Presence of automatic (implantable) cardiac defibrillator: Secondary | ICD-10-CM

## 2023-11-10 DIAGNOSIS — I5042 Chronic combined systolic (congestive) and diastolic (congestive) heart failure: Secondary | ICD-10-CM

## 2023-11-12 ENCOUNTER — Other Ambulatory Visit: Payer: Self-pay | Admitting: Nurse Practitioner

## 2023-11-12 ENCOUNTER — Ambulatory Visit: Payer: Self-pay | Admitting: Nurse Practitioner

## 2023-11-12 ENCOUNTER — Ambulatory Visit: Admitting: Sleep Medicine

## 2023-11-12 ENCOUNTER — Encounter: Payer: Self-pay | Admitting: Sleep Medicine

## 2023-11-12 VITALS — BP 112/72 | HR 87 | Resp 20 | Ht 67.0 in | Wt 249.0 lb

## 2023-11-12 DIAGNOSIS — I10 Essential (primary) hypertension: Secondary | ICD-10-CM

## 2023-11-12 DIAGNOSIS — I4891 Unspecified atrial fibrillation: Secondary | ICD-10-CM

## 2023-11-12 DIAGNOSIS — G4733 Obstructive sleep apnea (adult) (pediatric): Secondary | ICD-10-CM | POA: Diagnosis not present

## 2023-11-12 DIAGNOSIS — I5022 Chronic systolic (congestive) heart failure: Secondary | ICD-10-CM | POA: Diagnosis not present

## 2023-11-12 NOTE — Telephone Encounter (Signed)
 See triage note    Copied from CRM [id number]. Topic: Clinical - Medication Refill >> Nov 12, 2023  1:05 PM [name] wrote: Most Recent Primary Care Visit:  Provider: MAST, MAN X  Department: [facility]  Visit Type: ASSISTED LIVING  Date: 11/06/2023  Medication: Prednisone   Has the patient contacted their pharmacy? No (Agent: If no, request that the patient contact the pharmacy for the refill. If patient does not wish to contact the pharmacy document the reason why and proceed with request.) (Agent: If yes, when and what did the pharmacy advise?) Pt has seen the provider on 11/06/23 and was prescribed antibiotics. Provider told them that if the patient isn't feeling any better that they can call back and would be prescribed prednisone. Pt isn't feeling any better and he is still feeling very sleepy and tired as well so his daughter would like labs requested to check if he has gotten over the UTI.   Is this the correct pharmacy for this prescription? Yes If no, delete pharmacy and type the correct one.  This is the patient's preferred pharmacy:  [place], [place] - [address] [facility] [address] [place] [address] Phone: [phone] Fax: [phone]   Has the prescription been filled recently? No  Is the patient out of the medication? Pt hasn't been prescribed this medication yet.  Has the patient been seen for an appointment in the last year OR does the patient have an upcoming appointment? Yes  Can we respond through MyChart? No Give daughter Justin [name] a call back at [phone]  Agent: Please be advised that Rx refills may take up to 3 business days. We ask that you follow-up with your pharmacy.

## 2023-11-12 NOTE — Telephone Encounter (Signed)
 Mast, Man X, NP  You14 minutes ago (3:41 PM)    Please make an appointment with Dr. Jacquenette Shone 11/14/23 morning in the clinic @ [facility]. Thank you     Spoke with patients daughter and she opted to have patient seen tomorrow at [facility], Office  with [employer].

## 2023-11-12 NOTE — Telephone Encounter (Signed)
  Chief Complaint: UTI on antibiotics Symptoms: urinary frequency, fatigue, cough, burning with urination Frequency: since last week Pertinent Negatives: Patient denies fever, confusion Disposition: [] ED /[] Urgent Care (no appt availability in office) / [x] Appointment(In office/virtual)/ []  [facility]/ [] Home Care/ [x] Refused Recommended Disposition /[] [facility]/ []  Follow-up with PCP Additional Notes: Spoke with patients daughter who states patient was seen last week in office and diagnosed with UTI and bronchitis. Daughter reports patient is still having urinary frequency, fatigue, and burning with urination after antibiotics. Daughter also reports no change in cough or bronchitis symptoms since visit. Patient states she was advised by PCP to call back and ask for prednisone and a urine specimen to be ordered if symptoms unchanged after prior antibiotics. Attempted to schedule office visit per protocol, no availability until 3/26. Daughter requesting to speak with PCP to see if medication and urine order can be called in without appointment. Advised would forward to appropriate person. Advised to call back with worsening symptoms. Verbalized understanding.      Reason for Disposition  [1] Taking antibiotic > 72 hours (3 days) for UTI AND [2] painful urination or frequency is SAME (unchanged, not better)  Answer Assessment - Initial Assessment Questions 1. MAIN SYMPTOM: "What is the main symptom you are concerned about?" (e.g., painful urination, urine frequency)     frequency 2. BETTER-SAME-WORSE: "Are you getting better, staying the same, or getting worse compared to how you felt at your last visit to the doctor (most recent medical visit)?"     same 3. PAIN: "How bad is the pain?"  (e.g., Scale 1-10; mild, moderate, or severe)   - MILD (1-3): complains slightly about urination hurting   - MODERATE (4-7): interferes with normal activities     - SEVERE (8-10):  excruciating, unwilling or unable to urinate because of the pain      mild 4. FEVER: "Do you have a fever?" If Yes, ask: "What is it, how was it measured, and when did it start?"     denies 5. OTHER SYMPTOMS: "Do you have any other symptoms?" (e.g., blood in the urine, flank pain, scrotal pain or swelling)     cough 6. DIAGNOSIS: "When was the UTI diagnosed?" "By whom?" "Was it a kidney infection, bladder infection or both?"     UTI in office 7. ANTIBIOTIC: "What antibiotic(s) are you taking?" "How many times per day?"     augmention 8. ANTIBIOTIC - START DATE: "When did you start taking the antibiotic?"     3/13  Protocols used: Urinary Tract Infection on Antibiotic Follow-up Call - [facility]

## 2023-11-12 NOTE — Progress Notes (Signed)
 Name:Justin [name] MRN: [id number] DOB: 05-11-1934   CHIEF COMPLAINT:  EXCESSIVE DAYTIME SLEEPINESS   HISTORY OF PRESENT ILLNESS:  Justin [name] is an 89 y.o. w/ a h/o hyperlipidemia, obesity, gout, DMII, HTN, GERD, anxiety and depression who presents for c/o loud snoring and excessive daytime sleepiness which has been present for several years. Reports nocturnal awakenings due to nocturia, however does not have difficulty falling back to sleep. Reports a 40 lb weight loss over the last few months. Admits to dry mouth. Denies morning headaches, RLS symptoms, dream enactment, cataplexy, hypnagogic or hypnapompic hallucinations. Reports a family history of sleep apnea. Denies drowsy driving. Denies caffeine intake. Denies alcohol, tobacco or illicit drug use.   Bedtime 11-11:30 pm Sleep onset 5 mins Rise time 9:30 am   EPWORTH SLEEP SCORE 18    11/12/2023    1:24 PM  Results of the Epworth flowsheet  Sitting and reading 3  Watching TV 2  Sitting, inactive in a public place (e.g. a theatre or a meeting) 2  As a passenger in a car for an hour without a break 3  Lying down to rest in the afternoon when circumstances permit 3  Sitting and talking to someone 2  Sitting quietly after a lunch without alcohol 3  In a car, while stopped for a few minutes in traffic 0  Total score 18     PAST MEDICAL HISTORY :   has a past medical history of Age-related macular degeneration, dry, both eyes, Allergic, Anal fissure, Asthma, Benign prostatic hypertrophy, CAD (coronary artery disease), Carotid bruit, Chronic combined systolic and diastolic CHF (congestive heart failure) (HCC), Complication of anesthesia (1980s), Congestive heart failure (CHF) (HCC), ED (erectile dysfunction), Family history of adverse reaction to anesthesia, GERD (gastroesophageal reflux disease), Gout, HTN (hypertension), colonic polyps, Hyperlipidemia, Hypothyroidism, Moderate to severe aortic stenosis, Myocardial  infarction (HCC) (~ 2000), Obesity, Osteoarthritis, PAF (paroxysmal atrial fibrillation) (HCC), Precancerous skin lesion, Prostate cancer (HCC) (dx'd ~ 2014), 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  has a past surgical history that includes Colonoscopy (06/30/2008); Knee Arthroplasty (07/30/2011); Replacement total knee bilateral (Bilateral, 2012); Hammer toe surgery (Bilateral); Joint replacement; Mastectomy subcutaneous (Bilateral); ORIF finger / thumb fracture (Right, ~ 1980); Cataract extraction w/ intraocular lens  implant, bilateral (Bilateral); Cardiac catheterization (10/29/2005); Cardiac catheterization (N/A, 08/27/2016); Coronary artery bypass graft (2007); Cyst excision perineal (1980s); Anus surgery; Prostate biopsy; Transcatheter aortic valve replacement, transfemoral (N/A, 03/04/2017); TEE without cardioversion (N/A, 03/04/2017); Cataract extraction, bilateral (2012); BIV ICD INSERTION CRT-D (N/A, 05/02/2017); Colonoscopy; Multiple tooth extractions; Total knee revision (Right, 05/18/2019); Cardioversion (N/A, 04/24/2021); Cardioversion (N/A, 11/08/2021); Cardioversion (N/A, 02/06/2022); and ICD GENERATOR CHANGEOUT (N/A, 05/30/2023). Prior to Admission medications   Medication Sig Start Date End Date Taking? Authorizing Provider  albuterol (VENTOLIN HFA) 108 (90 Base) MCG/ACT inhaler Inhale 2 puffs into the lungs every 4 (four) hours as needed for wheezing or shortness of breath. 05/06/23  Yes Nelwyn Salisbury, MD  allopurinol (ZYLOPRIM) 100 MG tablet Take 1 tablet (100 mg total) by mouth daily. 09/18/23  Yes Nelwyn Salisbury, MD  amiodarone (PACERONE) 200 MG tablet Take 2 tablets by mouth twice daily x 2 weeks then decrease to 2 tablets by mouth daily x 2 weeks 11/13/22  Yes Duke Salvia, MD  amoxicillin-clavulanate (AUGMENTIN) 875-125 MG tablet Take 1 tablet by mouth 2 (two) times daily. 11/07/23  Yes Sharon Seller, NP  aspirin 81 MG tablet Take 1  tablet  (81 mg total) by mouth daily. 05/19/19  Yes Magnant, Charles L, PA-C  atorvastatin (LIPITOR) 20 MG tablet TAKE ONE TABLET ONCE DAILY 10/29/23  Yes Kathleene Hazel, MD  benzonatate (TESSALON) 200 MG capsule Take 1 capsule (200 mg total) by mouth 2 (two) times daily as needed for cough. 05/06/23  Yes Nelwyn Salisbury, MD  cetirizine (ZYRTEC) 10 MG tablet Take 10 mg by mouth 2 (two) times daily.   Yes [provider]  colchicine 0.6 MG tablet Take 1 tablet (0.6 mg total) by mouth every 6 (six) hours as needed (gout). 09/18/23 09/17/24 Yes Nelwyn Salisbury, MD  Continuous Glucose Receiver (DEXCOM G6 RECEIVER) DEVI 1 Device by Does not apply route 3 (three) times daily. E11.69 10/23/23  Yes Mast, Man X, NP  Continuous Glucose Sensor (DEXCOM G6 SENSOR) MISC 1 Device by Does not apply route 3 (three) times daily. E11.69 10/23/23  Yes Mast, Man X, NP  Continuous Glucose Transmitter (DEXCOM G6 TRANSMITTER) MISC 1 Device by Does not apply route 3 (three) times daily. E11.69 10/23/23  Yes Mast, Man X, NP  dabigatran (PRADAXA) 150 MG CAPS capsule TAKE (1) CAPSULE TWICE DAILY. 10/24/23  Yes Kathleene Hazel, MD  dapagliflozin propanediol (FARXIGA) 10 MG TABS tablet Take 10 mg by mouth daily before breakfast.   Yes [provider]  isosorbide mononitrate (IMDUR) 30 MG 24 hr tablet Take 1 tablet (30 mg total) by mouth daily. 09/02/23 12/01/23 Yes Kathleene Hazel, MD  ketotifen (ZADITOR) 0.025 % ophthalmic solution Place 1 drop into both eyes daily as needed (for itching).   Yes [provider]  levothyroxine (SYNTHROID) 200 MCG tablet Take 1 tablet (200 mcg total) by mouth daily. 12/25/22  Yes Nelwyn Salisbury, MD  losartan (COZAAR) 25 MG tablet Take 0.5 tablets (12.5 mg total) by mouth daily. 07/06/23 07/05/24 Yes Dorcas Carrow, MD  magic mouthwash SOLN Take 5 mLs by mouth as directed. Suspension contains equal amounts of Maalox Extra Strength, nystatin, and diphenhydramine. RX'ed by  dentist   Yes [provider]  methocarbamol (ROBAXIN) 500 MG tablet Take 1 tablet (500 mg total) by mouth 4 (four) times daily as needed for muscle spasms. 07/07/23  Yes London Sheer, MD  metoCLOPramide (REGLAN) 10 MG tablet TAKE ONE TABLET BY MOUTH EVERY MORNING WITH BREAKFAST 08/29/23  Yes Nelwyn Salisbury, MD  metolazone (ZAROXOLYN) 2.5 MG tablet Take 1 tablet (2.5 mg total) by mouth once a week every Saturday 10/27/23  Yes Duke Salvia, MD  metoprolol succinate (TOPROL-XL) 50 MG 24 hr tablet Take 2 tablets (100 mg total) by mouth daily. Take with or immediately following a meal. 05/30/23  Yes Duke Salvia, MD  montelukast (SINGULAIR) 10 MG tablet TAKE ONE TABLET BY MOUTH ONCE DAILY IN THE EVENING 08/20/22  Yes Hunsucker, Lesia Sago, MD  Multiple Minerals-Vitamins (CAL MAG ZINC +D3 PO) Take 2 tablets by mouth in the morning. 500 mg / 7 mg / 25 mcg   Yes [provider]  Multiple Vitamins-Minerals (MULTI-VITAMIN GUMMIES) CHEW Chew 2 tablets by mouth See admin instructions. Chew 2 gummies by mouth in the morning   Yes [provider]  Multiple Vitamins-Minerals (PRESERVISION AREDS 2) CAPS Take 1 capsule by mouth in the morning and at bedtime.   Yes [provider]  omeprazole (PRILOSEC) 20 MG capsule TAKE ONE CAPSULE TWICE DAILY 08/01/23  Yes Nelwyn Salisbury, MD  pantoprazole (PROTONIX) 40 MG tablet Take 1 tablet (40 mg  total) by mouth daily. 10/23/23  Yes Mast, Man X, NP  potassium chloride SA (KLOR-CON M) 20 MEQ tablet Take 1 tablet (20 mEq total) by mouth 2 (two) times daily. 02/18/23  Yes Graciella Freer, PA-C  sertraline (ZOLOFT) 50 MG tablet Take 1 tablet (50 mg total) by mouth daily. 08/01/23  Yes Nelwyn Salisbury, MD  silver sulfADIAZINE (SILVADENE) 1 % cream Apply 1 Application topically 2 (two) times daily. 05/14/23  Yes Nelwyn Salisbury, MD  spironolactone (ALDACTONE) 25 MG tablet Take 1 tablet (25 mg total) by mouth daily. 10/24/23  Yes Kathleene Hazel, MD  torsemide (DEMADEX) 20 MG tablet Take 2 tablets (40 mg total) every other day, alternating with 1 tablet (20 mg total) every other day. 02/18/23  Yes Graciella Freer, PA-C  triamcinolone cream (KENALOG) 0.1 % Apply 1 application  topically daily as needed for dry skin (affected areas). 04/15/17  Yes [provider]  valACYclovir (VALTREX) 500 MG tablet Take 500 mg by mouth 2 (two) times daily as needed (cold sores).   Yes [provider]  venlafaxine XR (EFFEXOR XR) 37.5 MG 24 hr capsule Take 1 capsule (37.5 mg total) by mouth daily with breakfast. 10/23/23  Yes Mast, Man X, NP   Allergies  Allergen Reactions   Peanut-Containing Drug Products Anaphylaxis   Sulfonamide Derivatives Anaphylaxis   Amlodipine Swelling and Other (See Comments)    Swelling in ankles   Eliquis [Apixaban] Other (See Comments)    Back/hip pain   Lisinopril Cough   Xarelto [Rivaroxaban] Other (See Comments)    Back/hip pain    FAMILY HISTORY:  family history includes Allergic rhinitis in his father; Asthma in his father; Breast cancer in his mother; Heart attack (age of onset: 102) in his father; Heart failure (age of onset: 30) in his mother; Uterine cancer in his mother. SOCIAL HISTORY:  reports that he quit smoking about 62 years ago. His smoking use included cigarettes. He started smoking about 75 years ago. He has a 45.5 pack-year smoking history. He has never used smokeless tobacco. He reports that he does not currently use alcohol after a past usage of about 7.0 standard drinks of alcohol per week. He reports that he does not use drugs.   Review of Systems:  Gen:  Denies  fever, sweats, chills weight loss  HEENT: Denies blurred vision, double vision, ear pain, eye pain, hearing loss, nose bleeds, sore throat Cardiac:  No dizziness, chest pain or heaviness, chest tightness,edema, No JVD Resp:   No cough, -sputum production, -shortness of breath,-wheezing, -hemoptysis,   Gi: Denies swallowing difficulty, stomach pain, nausea or vomiting, diarrhea, constipation, bowel incontinence Gu:  Denies bladder incontinence, burning urine Ext:   Denies Joint pain, stiffness or swelling Skin: Denies  skin rash, easy bruising or bleeding or hives Endoc:  Denies polyuria, polydipsia , polyphagia or weight change Psych:   Denies depression, insomnia or hallucinations  Other:  All other systems negative  VITAL SIGNS: BP 112/72   Pulse 87   Resp 20   Ht 5\' 7"  (1.702 m)   Wt 249 lb (112.9 kg)   SpO2 91%   BMI 39.00 kg/m    Physical Examination:   General Appearance: No distress  EYES PERRLA, EOM intact.   NECK Supple, No JVD Mallampati III Pulmonary: normal breath sounds, No wheezing.  Cardiovascular Irregular rhythm, No m/r/g.   Abdomen: Benign, Soft, non-tender. Skin:  warm, no rashes, no ecchymosis  Extremities: normal, no cyanosis,  clubbing. Neuro:without focal findings,  speech normal  PSYCHIATRIC: Mood, affect within normal limits.   ASSESSMENT AND PLAN  OSA I suspect that OSA is likely present due to clinical presentation. Discussed the consequences of untreated sleep apnea. Advised not to drive drowsy for safety of patient and others. Will complete further evaluation with a home sleep study and follow up to review results.    HTN Stable, on current management. Following with PCP.   Systolic Heart Failure Stable, following with cardiology.  Atrial fibrillation Stable, on current management.    MEDICATION ADJUSTMENTS/LABS AND TESTS ORDERED: Recommend Split Night Sleep Study   Patient  satisfied with Plan of action and management. All questions answered  Follow up to review PSG results and treatment plan.   I spent a total of 48 minutes reviewing chart data, face-to-face evaluation with the patient, counseling and coordination of care as detailed above.    Tempie Hoist, M.D.  Sleep Medicine [facility]

## 2023-11-13 ENCOUNTER — Telehealth: Payer: Self-pay

## 2023-11-13 ENCOUNTER — Encounter: Payer: Self-pay | Admitting: Adult Health

## 2023-11-13 ENCOUNTER — Ambulatory Visit: Admitting: Adult Health

## 2023-11-13 VITALS — BP 90/60 | HR 63 | Temp 97.9°F | Resp 20 | Ht 67.0 in | Wt 250.4 lb

## 2023-11-13 DIAGNOSIS — E1169 Type 2 diabetes mellitus with other specified complication: Secondary | ICD-10-CM | POA: Diagnosis not present

## 2023-11-13 DIAGNOSIS — N390 Urinary tract infection, site not specified: Secondary | ICD-10-CM

## 2023-11-13 DIAGNOSIS — J209 Acute bronchitis, unspecified: Secondary | ICD-10-CM

## 2023-11-13 DIAGNOSIS — I4819 Other persistent atrial fibrillation: Secondary | ICD-10-CM

## 2023-11-13 DIAGNOSIS — F339 Major depressive disorder, recurrent, unspecified: Secondary | ICD-10-CM | POA: Diagnosis not present

## 2023-11-13 DIAGNOSIS — I509 Heart failure, unspecified: Secondary | ICD-10-CM

## 2023-11-13 DIAGNOSIS — I25810 Atherosclerosis of coronary artery bypass graft(s) without angina pectoris: Secondary | ICD-10-CM

## 2023-11-13 LAB — POCT URINALYSIS DIPSTICK (MANUAL)
Nitrite, UA: NEGATIVE
Poct Bilirubin: NEGATIVE
Poct Blood: NEGATIVE
Poct Glucose: NORMAL mg/dL
Poct Protein: NEGATIVE mg/dL
Poct Urobilinogen: 1 mg/dL — AB
Spec Grav, UA: 1.01 (ref 1.010–1.025)
pH, UA: 6 (ref 5.0–8.0)

## 2023-11-13 MED ORDER — DM-GUAIFENESIN ER 30-600 MG PO TB12: 1.0000 | ORAL_TABLET | Freq: Two times a day (BID) | ORAL | 0 refills | Status: DC

## 2023-11-13 MED ORDER — DOXYCYCLINE HYCLATE 100 MG PO TABS: 100.0000 mg | ORAL_TABLET | Freq: Two times a day (BID) | ORAL | 0 refills | Status: DC

## 2023-11-13 MED ORDER — PREDNISONE 20 MG PO TABS: 20.0000 mg | ORAL_TABLET | Freq: Every day | ORAL | 0 refills | Status: DC

## 2023-11-13 NOTE — Telephone Encounter (Signed)
 Noted.

## 2023-11-13 NOTE — Progress Notes (Signed)
 [facility]  Provider:  Kenard Gower DNP  Code Status:  Limited DNR  Goals of Care:     11/13/2023    1:55 PM  Advanced Directives  Does Patient Have a Medical Advance Directive? Yes  Type of Estate agent of [place];Living will;Out of facility DNR (pink MOST or yellow form)  Does patient want to make changes to medical advance directive? No - Patient declined  Copy of Healthcare Power of Attorney in Chart? Yes - validated most recent copy scanned in chart (See row information)     Chief Complaint  Patient presents with   Dysuria    Patient is being seen for dysuria.   Discussed the use of AI scribe software for clinical note transcription with the patient, who gave verbal consent to proceed.  HPI:  Justin BENHAM "[name]" is an 89 year old male with macular degeneration, chronic kidney disease, and congestive heart failure who presents with fatigue and follow-up for urinary tract infection and bronchitis. He is accompanied by his daughter.  He is experiencing significant fatigue, sleeping approximately 15 hours a day. Despite completing a course of Augmentin for a urinary tract infection over a week ago, he continues to feel exhausted. No painful urination, fever, or chills are present, but he has difficulty assessing urine clarity due to macular degeneration.  He was diagnosed with bronchitis last week and was prescribed amoxicillin, which he completed on Monday. Despite this, his cough has worsened, and he experiences wheezing. He has been using an inhaler for relief but has not found relief with Tessalon Perles.  He has a history of severe depression, with a PHQ-9 score of 18. He recently switched from Zoloft to venlafaxine on October 23, 2023, to address his depressive symptoms. He feels depressed but denies any suicidal ideation.  He has chronic kidney disease stage 3B and congestive heart failure, for which he takes multiple medications  including Pradaxa and Metoprolol for atrial fibrillation,and torsemide and metolazone for CHF . He also has a history of coronary artery disease and underwent a quadruple bypass 20 years ago. He has a Electronics engineer in place. He denies chest pain   Past Medical History:  Diagnosis Date   Age-related macular degeneration, dry, both eyes    Allergic    "24/7; 365 days/year; I'm allergic to pollens, dust, all southern grasses/trees, mold, mildue, cats, dogs" (01/07/2017)   Anal fissure    Asthma    sees Dr. Kendrick Fries    Benign prostatic hypertrophy    (sees Dr. Chester Holstein   CAD (coronary artery disease)    a. s/p CABG 2007. b. Cath 08/2016 - 4/4 patent grafts.   Carotid bruit    carotid u/s 10/10: 0.39% bilaterally   Chronic combined systolic and diastolic CHF (congestive heart failure) (HCC)    Complication of anesthesia 1980s   "w/anal cyst OR, he gave me a saddle block then put a narcotic in spinal cord; had a severe reaction to that" (01/07/2017)   Congestive heart failure (CHF) [facility])    ED (erectile dysfunction)    Family history of adverse reaction to anesthesia    "daughter wakes up during OR" (01/07/2017)   GERD (gastroesophageal reflux disease)    Gout    HTN (hypertension)    Hx of colonic polyps    (sees Dr. Marina Goodell)   Hyperlipidemia    Hypothyroidism    Moderate to severe aortic stenosis    a. s/p TAVR 02/2017.   Myocardial infarction (HCC) ~  2000   Obesity    Osteoarthritis    "was in my knees, hands" (01/07/2017 )   PAF (paroxysmal atrial fibrillation) (HCC)    a. documented post TAVR.   Precancerous skin lesion    (sees Dr. 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   29 mm Edwards Sapien 3 transcatheter heart valve placed via percutaneous right transfemoral approach    Past Surgical History:  Procedure Laterality Date   ANUS SURGERY     "opened it back up cause it  wouldn't heal; wound up w/a fissure" (01/07/2017)   BIV ICD INSERTION CRT-D N/A 05/02/2017   Procedure: BIV ICD INSERTION CRT-D;  Surgeon: Duke Salvia, MD;  Location: [facility] INVASIVE CV LAB;  Service: Cardiovascular;  Laterality: N/A;   CARDIAC CATHETERIZATION  10/29/2005   CARDIAC CATHETERIZATION N/A 08/27/2016   Procedure: Right/Left Heart Cath and Coronary/Graft Angiography;  Surgeon: Kathleene Hazel, MD;  Location: [facility] INVASIVE CV LAB;  Service: Cardiovascular;  Laterality: N/A;   CARDIOVERSION N/A 04/24/2021   Procedure: CARDIOVERSION;  Surgeon: Pricilla Riffle, MD;  Location: [facility] ENDOSCOPY;  Service: Cardiovascular;  Laterality: N/A;   CARDIOVERSION N/A 11/08/2021   Procedure: CARDIOVERSION;  Surgeon: Chrystie Nose, MD;  Location: [facility] ENDOSCOPY;  Service: Cardiovascular;  Laterality: N/A;   CARDIOVERSION N/A 02/06/2022   Procedure: CARDIOVERSION;  Surgeon: Wendall Stade, MD;  Location: [facility] ENDOSCOPY;  Service: Cardiovascular;  Laterality: N/A;   CATARACT EXTRACTION W/ INTRAOCULAR LENS  IMPLANT, BILATERAL Bilateral    CATARACT EXTRACTION, BILATERAL  2012   COLONOSCOPY  06/30/2008   no repeats needed    COLONOSCOPY     had 3 or 4 in the past    CORONARY ARTERY BYPASS GRAFT  2007   "CABG X4"   CYST EXCISION PERINEAL  1980s   HAMMER TOE SURGERY Bilateral    ICD GENERATOR CHANGEOUT N/A 05/30/2023   Procedure: ICD GENERATOR CHANGEOUT;  Surgeon: Duke Salvia, MD;  Location: [facility] INVASIVE CV LAB;  Service: Cardiovascular;  Laterality: N/A;   JOINT REPLACEMENT     KNEE ARTHROPLASTY  07/30/2011   Procedure: COMPUTER ASSISTED TOTAL KNEE ARTHROPLASTY;  Surgeon: Cammy Copa;  Location: [facility] OR;  Service: Orthopedics;  Laterality: Left;  left total knee arthroplasty   MASTECTOMY SUBCUTANEOUS Bilateral    MULTIPLE TOOTH EXTRACTIONS     ORIF FINGER / THUMB FRACTURE Right ~ 1980   "repair of thumb injury"   PROSTATE BIOPSY     REPLACEMENT TOTAL KNEE BILATERAL Bilateral 2012   TEE WITHOUT  CARDIOVERSION N/A 03/04/2017   Procedure: TRANSESOPHAGEAL ECHOCARDIOGRAM (TEE);  Surgeon: Kathleene Hazel, MD;  Location: [facility] OR;  Service: Open Heart Surgery;  Laterality: N/A;   TOTAL KNEE REVISION Right 05/18/2019   Procedure: RIGHT PATELLA REVISION/REMOVAL;  Surgeon: Cammy Copa, MD;  Location: [facility] OR;  Service: Orthopedics;  Laterality: Right;   TRANSCATHETER AORTIC VALVE REPLACEMENT, TRANSFEMORAL N/A 03/04/2017   Procedure: TRANSCATHETER AORTIC VALVE REPLACEMENT, TRANSFEMORAL;  Surgeon: Kathleene Hazel, MD;  Location: [facility] OR;  Service: Open Heart Surgery;  Laterality: N/A;    Allergies  Allergen Reactions   Peanut-Containing Drug Products Anaphylaxis   Sulfonamide Derivatives Anaphylaxis   Amlodipine Swelling and Other (See Comments)    Swelling in ankles   Eliquis [Apixaban] Other (See Comments)    Back/hip pain   Lisinopril Cough   Xarelto [Rivaroxaban] Other (See Comments)  Back/hip pain    Outpatient Encounter Medications as of 11/13/2023  Medication Sig   albuterol (VENTOLIN HFA) 108 (90 Base) MCG/ACT inhaler Inhale 2 puffs into the lungs every 4 (four) hours as needed for wheezing or shortness of breath.   allopurinol (ZYLOPRIM) 100 MG tablet Take 1 tablet (100 mg total) by mouth daily.   amiodarone (PACERONE) 200 MG tablet Take 2 tablets by mouth twice daily x 2 weeks then decrease to 2 tablets by mouth daily x 2 weeks   amoxicillin-clavulanate (AUGMENTIN) 875-125 MG tablet Take 1 tablet by mouth 2 (two) times daily.   aspirin 81 MG tablet Take 1 tablet (81 mg total) by mouth daily.   atorvastatin (LIPITOR) 20 MG tablet TAKE ONE TABLET ONCE DAILY   benzonatate (TESSALON) 200 MG capsule Take 1 capsule (200 mg total) by mouth 2 (two) times daily as needed for cough.   cetirizine (ZYRTEC) 10 MG tablet Take 10 mg by mouth 2 (two) times daily.   colchicine 0.6 MG tablet Take 1 tablet (0.6 mg total) by mouth every 6 (six) hours as needed (gout).   Continuous  Glucose Receiver (DEXCOM G6 RECEIVER) DEVI 1 Device by Does not apply route 3 (three) times daily. E11.69   Continuous Glucose Sensor (DEXCOM G6 SENSOR) MISC 1 Device by Does not apply route 3 (three) times daily. E11.69   Continuous Glucose Transmitter (DEXCOM G6 TRANSMITTER) MISC 1 Device by Does not apply route 3 (three) times daily. E11.69   dabigatran (PRADAXA) 150 MG CAPS capsule TAKE (1) CAPSULE TWICE DAILY.   dapagliflozin propanediol (FARXIGA) 10 MG TABS tablet Take 10 mg by mouth daily before breakfast.   isosorbide mononitrate (IMDUR) 30 MG 24 hr tablet Take 1 tablet (30 mg total) by mouth daily.   ketotifen (ZADITOR) 0.025 % ophthalmic solution Place 1 drop into both eyes daily as needed (for itching).   levothyroxine (SYNTHROID) 200 MCG tablet Take 1 tablet (200 mcg total) by mouth daily.   losartan (COZAAR) 25 MG tablet Take 0.5 tablets (12.5 mg total) by mouth daily.   magic mouthwash SOLN Take 5 mLs by mouth as directed. Suspension contains equal amounts of Maalox Extra Strength, nystatin, and diphenhydramine. RX'ed by dentist   methocarbamol (ROBAXIN) 500 MG tablet Take 1 tablet (500 mg total) by mouth 4 (four) times daily as needed for muscle spasms.   metoCLOPramide (REGLAN) 10 MG tablet TAKE ONE TABLET BY MOUTH EVERY MORNING WITH BREAKFAST   metolazone (ZAROXOLYN) 2.5 MG tablet Take 1 tablet (2.5 mg total) by mouth once a week every Saturday   metoprolol succinate (TOPROL-XL) 50 MG 24 hr tablet Take 2 tablets (100 mg total) by mouth daily. Take with or immediately following a meal.   montelukast (SINGULAIR) 10 MG tablet TAKE ONE TABLET BY MOUTH ONCE DAILY IN THE EVENING   Multiple Minerals-Vitamins (CAL MAG ZINC +D3 PO) Take 2 tablets by mouth in the morning. 500 mg / 7 mg / 25 mcg   Multiple Vitamins-Minerals (MULTI-VITAMIN GUMMIES) CHEW Chew 2 tablets by mouth See admin instructions. Chew 2 gummies by mouth in the morning   Multiple Vitamins-Minerals (PRESERVISION AREDS 2) CAPS  Take 1 capsule by mouth in the morning and at bedtime.   omeprazole (PRILOSEC) 20 MG capsule TAKE ONE CAPSULE TWICE DAILY   pantoprazole (PROTONIX) 40 MG tablet Take 1 tablet (40 mg total) by mouth daily.   potassium chloride SA (KLOR-CON M) 20 MEQ tablet Take 1 tablet (20 mEq total) by mouth 2 (two) times daily.  sertraline (ZOLOFT) 50 MG tablet Take 1 tablet (50 mg total) by mouth daily.   silver sulfADIAZINE (SILVADENE) 1 % cream Apply 1 Application topically 2 (two) times daily.   spironolactone (ALDACTONE) 25 MG tablet Take 1 tablet (25 mg total) by mouth daily.   torsemide (DEMADEX) 20 MG tablet Take 2 tablets (40 mg total) every other day, alternating with 1 tablet (20 mg total) every other day.   triamcinolone cream (KENALOG) 0.1 % Apply 1 application  topically daily as needed for dry skin (affected areas).   valACYclovir (VALTREX) 500 MG tablet Take 500 mg by mouth 2 (two) times daily as needed (cold sores).   venlafaxine XR (EFFEXOR XR) 37.5 MG 24 hr capsule Take 1 capsule (37.5 mg total) by mouth daily with breakfast.   No facility-administered encounter medications on file as of 11/13/2023.    Review of Systems:  Review of Systems  Constitutional:  Negative for activity change, appetite change and fever.  HENT:  Negative for sore throat.   Eyes: Negative.   Respiratory:  Positive for cough and wheezing.   Cardiovascular:  Negative for chest pain and leg swelling.  Gastrointestinal:  Negative for abdominal distention, diarrhea and vomiting.  Genitourinary:  Negative for dysuria, frequency and urgency.  Skin:  Negative for color change.  Neurological:  Negative for dizziness and headaches.  Psychiatric/Behavioral:  Negative for behavioral problems and sleep disturbance. The patient is not nervous/anxious.     Health Maintenance  Topic Date Due   FOOT EXAM  Never done   COVID-19 Vaccine (6 - 2024-25 season) 05/26/2025 (Originally 04/27/2023)   OPHTHALMOLOGY EXAM  01/07/2024    Medicare Annual Wellness (AWV)  03/30/2024   HEMOGLOBIN A1C  04/12/2024   DTaP/Tdap/Td (3 - Td or Tdap) 10/28/2030   Pneumonia Vaccine 34+ Years old  Completed   INFLUENZA VACCINE  Completed   Zoster Vaccines- Shingrix  Completed   HPV VACCINES  Aged Out    Physical Exam: Vitals:   11/13/23 1347  BP: 90/60  Pulse: 63  Resp: 20  Temp: 97.9 F (36.6 C)  SpO2: 93%  Weight: 250 lb 6.4 oz (113.6 kg)  Height: 5\' 7"  (1.702 m)   Body mass index is 39.22 kg/m. Physical Exam Constitutional:      General: He is not in acute distress.    Appearance: He is obese.  HENT:     Head: Normocephalic and atraumatic.     Mouth/Throat:     Mouth: Mucous membranes are moist.  Eyes:     Conjunctiva/sclera: Conjunctivae normal.  Cardiovascular:     Rate and Rhythm: Normal rate and regular rhythm.     Pulses: Normal pulses.     Heart sounds: Normal heart sounds.  Pulmonary:     Effort: Pulmonary effort is normal.     Breath sounds: Wheezing present.  Abdominal:     General: Bowel sounds are normal.     Palpations: Abdomen is soft.  Musculoskeletal:        General: No swelling. Normal range of motion.     Cervical back: Normal range of motion.  Skin:    General: Skin is warm and dry.  Neurological:     General: No focal deficit present.     Mental Status: He is alert and oriented to person, place, and time.  Psychiatric:        Mood and Affect: Mood normal.        Behavior: Behavior normal.  Thought Content: Thought content normal.        Judgment: Judgment normal.     Labs reviewed: Basic Metabolic Panel: Recent Labs    04/18/23 1141 05/23/23 1220 06/30/23 1616 06/30/23 1637 07/01/23 0355 07/02/23 0415 07/06/23 0357 07/18/23 1042 10/14/23 0729  NA 138   < >  --   --  140   < > 136 140 134*  K 3.9   < >  --   --  4.1   < > 3.4* 3.8 3.6  CL 95*   < >  --   --  106   < > 99 97 91*  CO2 25   < >  --   --  24   < > 27 25 34*  GLUCOSE 131*   < >  --   --  125*   <  > 113* 90 108*  BUN 29*   < >  --   --  14   < > 20 27 37*  CREATININE 1.33*   < >  --   --  1.30*   < > 1.44* 1.38* 1.62*  CALCIUM 8.8   < >  --   --  7.8*   < > 8.2* 8.4* 8.4*  MG  --   --  2.7*  --   --   --   --   --   --   PHOS  --   --  2.3*  --  3.3  --   --   --   --   TSH 0.844  --   --  9.303*  --   --   --   --  3.27   < > = values in this interval not displayed.   Liver Function Tests: Recent Labs    02/17/23 1231 04/18/23 1141 06/30/23 1321 10/14/23 0729  AST 27 27 30 30   ALT 18 23 26 24   ALKPHOS 119 122* 132*  --   BILITOT 0.6 0.8 1.1 0.7  PROT 7.3 6.7 7.0 6.5  ALBUMIN 3.8 3.3* 2.7*  --    No results for input(s): "LIPASE", "AMYLASE" in the last 8760 hours. No results for input(s): "AMMONIA" in the last 8760 hours. CBC: Recent Labs    06/30/23 1321 07/01/23 0355 10/14/23 0729 11/04/23 0728  WBC 11.5* 11.9* 13.2* 15.0*  NEUTROABS 8.9*  --  9,570* 11,235*  HGB 12.5* 11.2* 13.7 13.9  HCT 41.6 36.9* 43.9 44.5  MCV 87.6 87.4 84.3 86.1  PLT 313 283 342 416*   Lipid Panel: Recent Labs    07/03/23 0426 10/14/23 0729  CHOL 74 108  HDL 25* 33*  LDLCALC 30 55  TRIG 96 113  CHOLHDL 3.0 3.3   Lab Results  Component Value Date   HGBA1C 6.8 (H) 10/14/2023    Procedures since last visit: CUP PACEART INCLINIC DEVICE CHECK Result Date: 10/24/2023 Normal in-clinic CRT-D check. Thresholds, sensing, impedance trend, and HF diagnostics are within normal limits or stable for patient over time. AT/AF burden 100%.  Increased V rates on HG's.  Optivol/thoracic impedance suggests fluid on board.  Patient BiV pacing 75.8% of the time. Reviewed with Dr. Graciela Husbands.  Estimated longevity 7.3 YEARS . Pt enrolled in remote follow-up.Syliva Overman, RN  DG Chest 2 View Result Date: 10/21/2023 CLINICAL DATA:  Leukocytosis EXAM: CHEST - 2 VIEW COMPARISON:  X-ray 06/30/2023 FINDINGS: Sternal wires. Enlarged cardiopericardial silhouette. Status post TAVR. Left upper chest defibrillator.  Tiny left pleural effusion. No pneumothorax,  edema or consolidation. Degenerative changes along the spine. IMPRESSION: Postop chest.  Defibrillator.  Status post TAVR. Tiny left effusion. Electronically Signed   By: Karen Kays M.D.   On: 10/21/2023 13:08    Assessment/Plan  1. Acute bronchitis, unspecified organism (Primary) -  Worsening cough and wheezing. Amoxicillin ineffective. No fever. Switched to doxycycline and added prednisone for inflammation and wheezing. - Prescribe doxycycline 100 mg twice daily for 7 days. - Prescribe prednisone 20 mg daily for 5 days. - Discontinue Tessalon Perles and prescribe Mucinex DM twice daily for 14 days. - Ensure use of inhaler during episodes of coughing and wheezing. - doxycycline (VIBRA-TABS) 100 MG tablet; Take 1 tablet (100 mg total) by mouth 2 (two) times daily for 7 days.  Dispense: 14 tablet; Refill: 0 - dextromethorphan-guaiFENesin (MUCINEX DM) 30-600 MG 12hr tablet; Take 1 tablet by mouth 2 (two) times daily for 14 days.  Dispense: 28 tablet; Refill: 0 - predniSONE (DELTASONE) 20 MG tablet; Take 1 tablet (20 mg total) by mouth daily with breakfast for 5 days.  Dispense: 5 tablet; Refill: 0  2. Urinary tract infection without hematuria, site unspecified -  Previously treated with Augmentin. Fatigue persists, possibly related to UTI. No dysuria or fever. - Order urine dipstick to check for UTI resolution. - Ensure completion of Augmentin course. - POCT Urinalysis Dip Manual - Urine Culture  3. Depression, recurrent (HCC) -  PHQ-9 score of 18. Switched from Zoloft to venlafaxine. Counseling desired. - Continue venlafaxine 37.5 mg daily. - Refer to psychiatrist for evaluation and potential counseling. - Ambulatory referral to Psychiatry  4. Chronic congestive heart failure, unspecified heart failure type (HCC) -  On torsemide, metolazone, and Farxiga. Significant fatigue. Low blood pressure requires re-evaluation. - Continue current heart  failure medications. - Monitor fluid status and adjust diuretics as needed.  5. Persistent atrial fibrillation (HCC) -  Managed with Pradaxa and metoprolol. Heart rate controll -  - Continue Pradaxa and metoprolol.  6. Type 2 diabetes mellitus with other specified complication, without long-term current use of insulin (HCC) -  A1c is 6.8. Not on medication.  7. 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 Managed with medications. - Continue current cardiac medications.     Labs/tests ordered:   POC urine dipstick, urine culture   No follow-ups on file.  Kenard Gower, NP

## 2023-11-13 NOTE — Telephone Encounter (Signed)
 I called patient daughter [name] to bring father back into the office today or tomorrow collect another urine specimen. She didn't answer the phone. Voicemail was left with office call back number. We will need to make another attempt to contact her. Message routed to both medical assistant and ordering provider.

## 2023-11-14 ENCOUNTER — Other Ambulatory Visit

## 2023-11-14 DIAGNOSIS — N390 Urinary tract infection, site not specified: Secondary | ICD-10-CM | POA: Diagnosis not present

## 2023-11-14 NOTE — Telephone Encounter (Signed)
 Please try contacting patient daughter this morning to see if she can bring him in for another specimen.

## 2023-11-14 NOTE — Progress Notes (Signed)
 EPIC Encounter for ICM Monitoring  Patient Name: Justin [name] is a 89 y.o. male Date: 11/14/2023 Primary Care Physican: Mast, Man X, NP Primary Cardiologist: McAlhany/Sabharwal  Electrophysiologist: Joycelyn Schmid Pacing: 78.2%  09/27/2022 Weight: 270 lbs 02/12/2023 Weight: 261 lbs 07/07/2023 Office Weight: 261 lbs 10/07/2023 Weight: 245-250 lbs 10/24/2023 Office Weight: 252 lbs   Time in AT/AF  24.0 hr/day (100.0%)   Transmission results reviewed.     Optivol thoracic impedance suggesting intermittent days with possible fluid accumulation since 2/17.   Prescribed: Torsemide 20 mg take 2 tablets (40 mg total) every other day alternating with 1 tablet (20 mg total) every other day.  Per 10/24/2023 Dr Graciela Husbands OV note may have to tolerate some edema given his tendency for his creatinine  Potassium 20 mEq take 1 tablet by mouth twice a day Metolazone 2.5 mg Take 1 tablet by mouth once a week on Saturday (per 7/15 phone note should take extra 40 mEq of Potassium with Metolazone). Spironolactone 25 mg take 1 tablet daily   Labs: 10/14/2023 Creatinine 1.62, BUN 37, Potassium 3.6, Sodium 134, GFR 41 A complete set of results can be found in Results Review.   Recommendations:  None     Follow-up plan: ICM clinic phone appointment on 12/15/2023.   91 day device clinic remote transmission 12/02/2023.     EP/Cardiology Office Visits:   Recall 04/21/2024 with Dr Graciela Husbands.   Recall 10/05/2023 with HF clinic.   Copy of ICM check sent to Dr. Graciela Husbands.   3 month ICM trend: 11/10/2023.    12-14 Month ICM trend:     Karie Soda, RN 11/14/2023 9:29 AM

## 2023-11-15 LAB — URINE CULTURE
MICRO NUMBER:: 16233530
Result:: NO GROWTH
SPECIMEN QUALITY:: ADEQUATE

## 2023-11-17 ENCOUNTER — Other Ambulatory Visit: Payer: Self-pay

## 2023-11-17 ENCOUNTER — Telehealth: Payer: Self-pay | Admitting: Internal Medicine

## 2023-11-17 ENCOUNTER — Telehealth: Payer: Self-pay

## 2023-11-17 ENCOUNTER — Inpatient Hospital Stay (HOSPITAL_COMMUNITY)
Admission: EM | Admit: 2023-11-17 | Discharge: 2023-11-27 | DRG: 871 | Disposition: A | Discharge status: Skilled Nursing Facility | Attending: Internal Medicine | Admitting: Internal Medicine

## 2023-11-17 ENCOUNTER — Encounter (HOSPITAL_COMMUNITY): Payer: Self-pay | Admitting: Emergency Medicine

## 2023-11-17 ENCOUNTER — Emergency Department (HOSPITAL_COMMUNITY)

## 2023-11-17 DIAGNOSIS — N1831 Chronic kidney disease, stage 3a: Secondary | ICD-10-CM | POA: Diagnosis present

## 2023-11-17 DIAGNOSIS — J45909 Unspecified asthma, uncomplicated: Secondary | ICD-10-CM | POA: Diagnosis present

## 2023-11-17 DIAGNOSIS — Z8249 Family history of ischemic heart disease and other diseases of the circulatory system: Secondary | ICD-10-CM

## 2023-11-17 DIAGNOSIS — E039 Hypothyroidism, unspecified: Secondary | ICD-10-CM | POA: Diagnosis not present

## 2023-11-17 DIAGNOSIS — B9620 Unspecified Escherichia coli [E. coli] as the cause of diseases classified elsewhere: Secondary | ICD-10-CM | POA: Diagnosis present

## 2023-11-17 DIAGNOSIS — I509 Heart failure, unspecified: Secondary | ICD-10-CM | POA: Diagnosis not present

## 2023-11-17 DIAGNOSIS — Z8546 Personal history of malignant neoplasm of prostate: Secondary | ICD-10-CM

## 2023-11-17 DIAGNOSIS — U071 COVID-19: Secondary | ICD-10-CM | POA: Diagnosis not present

## 2023-11-17 DIAGNOSIS — Z7982 Long term (current) use of aspirin: Secondary | ICD-10-CM

## 2023-11-17 DIAGNOSIS — Z953 Presence of xenogenic heart valve: Secondary | ICD-10-CM

## 2023-11-17 DIAGNOSIS — Z860100 Personal history of colon polyps, unspecified: Secondary | ICD-10-CM

## 2023-11-17 DIAGNOSIS — I480 Paroxysmal atrial fibrillation: Secondary | ICD-10-CM | POA: Diagnosis present

## 2023-11-17 DIAGNOSIS — R8271 Bacteriuria: Secondary | ICD-10-CM | POA: Diagnosis not present

## 2023-11-17 DIAGNOSIS — E66812 Obesity, class 2: Secondary | ICD-10-CM | POA: Diagnosis present

## 2023-11-17 DIAGNOSIS — R6520 Severe sepsis without septic shock: Secondary | ICD-10-CM | POA: Diagnosis not present

## 2023-11-17 DIAGNOSIS — I472 Ventricular tachycardia, unspecified: Secondary | ICD-10-CM | POA: Diagnosis present

## 2023-11-17 DIAGNOSIS — A419 Sepsis, unspecified organism: Secondary | ICD-10-CM | POA: Diagnosis not present

## 2023-11-17 DIAGNOSIS — R918 Other nonspecific abnormal finding of lung field: Secondary | ICD-10-CM | POA: Diagnosis not present

## 2023-11-17 DIAGNOSIS — Z66 Do not resuscitate: Secondary | ICD-10-CM | POA: Diagnosis not present

## 2023-11-17 DIAGNOSIS — I4821 Permanent atrial fibrillation: Secondary | ICD-10-CM | POA: Diagnosis not present

## 2023-11-17 DIAGNOSIS — E785 Hyperlipidemia, unspecified: Secondary | ICD-10-CM | POA: Diagnosis not present

## 2023-11-17 DIAGNOSIS — I130 Hypertensive heart and chronic kidney disease with heart failure and stage 1 through stage 4 chronic kidney disease, or unspecified chronic kidney disease: Secondary | ICD-10-CM | POA: Diagnosis present

## 2023-11-17 DIAGNOSIS — I5022 Chronic systolic (congestive) heart failure: Secondary | ICD-10-CM | POA: Diagnosis present

## 2023-11-17 DIAGNOSIS — Z7902 Long term (current) use of antithrombotics/antiplatelets: Secondary | ICD-10-CM

## 2023-11-17 DIAGNOSIS — J90 Pleural effusion, not elsewhere classified: Secondary | ICD-10-CM | POA: Diagnosis not present

## 2023-11-17 DIAGNOSIS — Z87440 Personal history of urinary (tract) infections: Secondary | ICD-10-CM

## 2023-11-17 DIAGNOSIS — Z9841 Cataract extraction status, right eye: Secondary | ICD-10-CM

## 2023-11-17 DIAGNOSIS — E1122 Type 2 diabetes mellitus with diabetic chronic kidney disease: Secondary | ICD-10-CM | POA: Diagnosis not present

## 2023-11-17 DIAGNOSIS — Z825 Family history of asthma and other chronic lower respiratory diseases: Secondary | ICD-10-CM

## 2023-11-17 DIAGNOSIS — I5043 Acute on chronic combined systolic (congestive) and diastolic (congestive) heart failure: Secondary | ICD-10-CM | POA: Diagnosis present

## 2023-11-17 DIAGNOSIS — Z951 Presence of aortocoronary bypass graft: Secondary | ICD-10-CM

## 2023-11-17 DIAGNOSIS — Z6838 Body mass index (BMI) 38.0-38.9, adult: Secondary | ICD-10-CM

## 2023-11-17 DIAGNOSIS — N179 Acute kidney failure, unspecified: Secondary | ICD-10-CM | POA: Diagnosis present

## 2023-11-17 DIAGNOSIS — R0603 Acute respiratory distress: Secondary | ICD-10-CM | POA: Diagnosis not present

## 2023-11-17 DIAGNOSIS — E119 Type 2 diabetes mellitus without complications: Secondary | ICD-10-CM

## 2023-11-17 DIAGNOSIS — J9811 Atelectasis: Secondary | ICD-10-CM | POA: Diagnosis not present

## 2023-11-17 DIAGNOSIS — M2609 Other specified anomalies of jaw size: Secondary | ICD-10-CM | POA: Diagnosis present

## 2023-11-17 DIAGNOSIS — Z882 Allergy status to sulfonamides status: Secondary | ICD-10-CM

## 2023-11-17 DIAGNOSIS — I700 Atherosclerosis of aorta: Secondary | ICD-10-CM | POA: Diagnosis present

## 2023-11-17 DIAGNOSIS — I447 Left bundle-branch block, unspecified: Secondary | ICD-10-CM | POA: Diagnosis present

## 2023-11-17 DIAGNOSIS — K219 Gastro-esophageal reflux disease without esophagitis: Secondary | ICD-10-CM | POA: Diagnosis not present

## 2023-11-17 DIAGNOSIS — Z96653 Presence of artificial knee joint, bilateral: Secondary | ICD-10-CM | POA: Diagnosis present

## 2023-11-17 DIAGNOSIS — I110 Hypertensive heart disease with heart failure: Secondary | ICD-10-CM | POA: Diagnosis not present

## 2023-11-17 DIAGNOSIS — Z7901 Long term (current) use of anticoagulants: Secondary | ICD-10-CM

## 2023-11-17 DIAGNOSIS — J189 Pneumonia, unspecified organism: Secondary | ICD-10-CM | POA: Diagnosis not present

## 2023-11-17 DIAGNOSIS — R0602 Shortness of breath: Secondary | ICD-10-CM | POA: Diagnosis not present

## 2023-11-17 DIAGNOSIS — F329 Major depressive disorder, single episode, unspecified: Secondary | ICD-10-CM | POA: Diagnosis present

## 2023-11-17 DIAGNOSIS — J129 Viral pneumonia, unspecified: Secondary | ICD-10-CM | POA: Diagnosis not present

## 2023-11-17 DIAGNOSIS — Z7952 Long term (current) use of systemic steroids: Secondary | ICD-10-CM

## 2023-11-17 DIAGNOSIS — Z79890 Hormone replacement therapy (postmenopausal): Secondary | ICD-10-CM

## 2023-11-17 DIAGNOSIS — Z87891 Personal history of nicotine dependence: Secondary | ICD-10-CM

## 2023-11-17 DIAGNOSIS — I350 Nonrheumatic aortic (valve) stenosis: Secondary | ICD-10-CM | POA: Diagnosis present

## 2023-11-17 DIAGNOSIS — I2510 Atherosclerotic heart disease of native coronary artery without angina pectoris: Secondary | ICD-10-CM | POA: Diagnosis not present

## 2023-11-17 DIAGNOSIS — Z95810 Presence of automatic (implantable) cardiac defibrillator: Secondary | ICD-10-CM

## 2023-11-17 DIAGNOSIS — A4189 Other specified sepsis: Principal | ICD-10-CM | POA: Diagnosis present

## 2023-11-17 DIAGNOSIS — J9601 Acute respiratory failure with hypoxia: Secondary | ICD-10-CM | POA: Diagnosis not present

## 2023-11-17 DIAGNOSIS — Z9842 Cataract extraction status, left eye: Secondary | ICD-10-CM

## 2023-11-17 DIAGNOSIS — I493 Ventricular premature depolarization: Secondary | ICD-10-CM | POA: Diagnosis present

## 2023-11-17 DIAGNOSIS — N400 Enlarged prostate without lower urinary tract symptoms: Secondary | ICD-10-CM | POA: Diagnosis present

## 2023-11-17 DIAGNOSIS — Z888 Allergy status to other drugs, medicaments and biological substances status: Secondary | ICD-10-CM

## 2023-11-17 DIAGNOSIS — B342 Coronavirus infection, unspecified: Secondary | ICD-10-CM

## 2023-11-17 DIAGNOSIS — R748 Abnormal levels of other serum enzymes: Secondary | ICD-10-CM | POA: Diagnosis present

## 2023-11-17 DIAGNOSIS — I10 Essential (primary) hypertension: Secondary | ICD-10-CM | POA: Diagnosis present

## 2023-11-17 DIAGNOSIS — G501 Atypical facial pain: Secondary | ICD-10-CM | POA: Diagnosis not present

## 2023-11-17 DIAGNOSIS — I252 Old myocardial infarction: Secondary | ICD-10-CM | POA: Diagnosis not present

## 2023-11-17 DIAGNOSIS — Z7984 Long term (current) use of oral hypoglycemic drugs: Secondary | ICD-10-CM

## 2023-11-17 DIAGNOSIS — Z79899 Other long term (current) drug therapy: Secondary | ICD-10-CM

## 2023-11-17 DIAGNOSIS — I499 Cardiac arrhythmia, unspecified: Secondary | ICD-10-CM | POA: Diagnosis not present

## 2023-11-17 DIAGNOSIS — B9729 Other coronavirus as the cause of diseases classified elsewhere: Secondary | ICD-10-CM | POA: Diagnosis present

## 2023-11-17 DIAGNOSIS — H353130 Nonexudative age-related macular degeneration, bilateral, stage unspecified: Secondary | ICD-10-CM | POA: Diagnosis present

## 2023-11-17 DIAGNOSIS — Z91010 Allergy to peanuts: Secondary | ICD-10-CM

## 2023-11-17 DIAGNOSIS — Z961 Presence of intraocular lens: Secondary | ICD-10-CM | POA: Diagnosis present

## 2023-11-17 DIAGNOSIS — R0989 Other specified symptoms and signs involving the circulatory and respiratory systems: Secondary | ICD-10-CM | POA: Diagnosis not present

## 2023-11-17 LAB — CBC WITH DIFFERENTIAL/PLATELET
Abs Immature Granulocytes: 0.36 10*3/uL — ABNORMAL HIGH (ref 0.00–0.07)
Basophils Absolute: 0 10*3/uL (ref 0.0–0.1)
Basophils Relative: 0 %
Eosinophils Absolute: 0 10*3/uL (ref 0.0–0.5)
Eosinophils Relative: 0 %
HCT: 41.4 % (ref 39.0–52.0)
Hemoglobin: 12.8 g/dL — ABNORMAL LOW (ref 13.0–17.0)
Immature Granulocytes: 1 %
Lymphocytes Relative: 11 %
Lymphs Abs: 3.2 10*3/uL (ref 0.7–4.0)
MCH: 27.1 pg (ref 26.0–34.0)
MCHC: 30.9 g/dL (ref 30.0–36.0)
MCV: 87.7 fL (ref 80.0–100.0)
Monocytes Absolute: 1.9 10*3/uL — ABNORMAL HIGH (ref 0.1–1.0)
Monocytes Relative: 7 %
Neutro Abs: 24.2 10*3/uL — ABNORMAL HIGH (ref 1.7–7.7)
Neutrophils Relative %: 81 %
Platelets: 347 10*3/uL (ref 150–400)
RBC: 4.72 MIL/uL (ref 4.22–5.81)
RDW: 18.7 % — ABNORMAL HIGH (ref 11.5–15.5)
WBC: 29.7 10*3/uL — ABNORMAL HIGH (ref 4.0–10.5)
nRBC: 0 % (ref 0.0–0.2)

## 2023-11-17 LAB — COMPREHENSIVE METABOLIC PANEL
ALT: 38 U/L (ref 0–44)
AST: 53 U/L — ABNORMAL HIGH (ref 15–41)
Albumin: 2.2 g/dL — ABNORMAL LOW (ref 3.5–5.0)
Alkaline Phosphatase: 135 U/L — ABNORMAL HIGH (ref 38–126)
Anion gap: 13 (ref 5–15)
BUN: 41 mg/dL — ABNORMAL HIGH (ref 8–23)
CO2: 24 mmol/L (ref 22–32)
Calcium: 8.3 mg/dL — ABNORMAL LOW (ref 8.9–10.3)
Chloride: 99 mmol/L (ref 98–111)
Creatinine, Ser: 1.42 mg/dL — ABNORMAL HIGH (ref 0.61–1.24)
GFR, Estimated: 48 mL/min — ABNORMAL LOW (ref >60)
Glucose, Bld: 127 mg/dL — ABNORMAL HIGH (ref 70–99)
Potassium: 4.3 mmol/L (ref 3.5–5.1)
Sodium: 136 mmol/L (ref 135–145)
Total Bilirubin: 1.7 mg/dL — ABNORMAL HIGH (ref 0.0–1.2)
Total Protein: 6.9 g/dL (ref 6.5–8.1)

## 2023-11-17 LAB — BLOOD GAS, ARTERIAL
Acid-Base Excess: 0.9 mmol/L (ref 0.0–2.0)
Bicarbonate: 23.8 mmol/L (ref 20.0–28.0)
Delivery systems: POSITIVE
Drawn by: 331471
Expiratory PAP: 6 cmH2O
Inspiratory PAP: 12 cmH2O
O2 Saturation: 97 %
Patient temperature: 37
RATE: 12 {breaths}/min
pCO2 arterial: 32 mmHg (ref 32–48)
pH, Arterial: 7.48 — ABNORMAL HIGH (ref 7.35–7.45)
pO2, Arterial: 74 mmHg — ABNORMAL LOW (ref 83–108)

## 2023-11-17 LAB — TROPONIN I (HIGH SENSITIVITY)
Troponin I (High Sensitivity): 14 ng/L (ref <18)
Troponin I (High Sensitivity): 17 ng/L (ref <18)

## 2023-11-17 LAB — BRAIN NATRIURETIC PEPTIDE: B Natriuretic Peptide: 880.6 pg/mL — ABNORMAL HIGH (ref 0.0–100.0)

## 2023-11-17 LAB — BLOOD GAS, VENOUS
Acid-Base Excess: 3.1 mmol/L — ABNORMAL HIGH (ref 0.0–2.0)
Bicarbonate: 29.5 mmol/L — ABNORMAL HIGH (ref 20.0–28.0)
O2 Saturation: 28.9 %
Patient temperature: 37
pCO2, Ven: 51 mmHg (ref 44–60)
pH, Ven: 7.37 (ref 7.25–7.43)
pO2, Ven: <31 mmHg — CL (ref 32–45)

## 2023-11-17 LAB — LACTIC ACID, PLASMA: Lactic Acid, Venous: 4.8 mmol/L (ref 0.5–1.9)

## 2023-11-17 MED ORDER — VENLAFAXINE HCL ER 37.5 MG PO CP24
37.5000 mg | ORAL_CAPSULE | Freq: Every day | ORAL | Status: DC
  Administered 2023-11-18 – 2023-11-27 (×10): 37.5 mg via ORAL
  Filled 2023-11-17 (×11): qty 1

## 2023-11-17 MED ORDER — BUDESONIDE 0.25 MG/2ML IN SUSP
0.2500 mg | Freq: Two times a day (BID) | RESPIRATORY_TRACT | Status: DC
  Administered 2023-11-17 – 2023-11-27 (×20): 0.25 mg via RESPIRATORY_TRACT
  Filled 2023-11-17 (×20): qty 2

## 2023-11-17 MED ORDER — ACETAMINOPHEN 325 MG PO TABS: 650.0000 mg | ORAL_TABLET | Freq: Four times a day (QID) | ORAL | Status: DC | PRN

## 2023-11-17 MED ORDER — ALBUTEROL SULFATE (2.5 MG/3ML) 0.083% IN NEBU
10.0000 mg/h | INHALATION_SOLUTION | Freq: Once | RESPIRATORY_TRACT | Status: AC
  Administered 2023-11-17: 10 mg/h via RESPIRATORY_TRACT
  Filled 2023-11-17: qty 12

## 2023-11-17 MED ORDER — PANTOPRAZOLE SODIUM 40 MG PO TBEC
40.0000 mg | DELAYED_RELEASE_TABLET | Freq: Every day | ORAL | Status: DC
  Administered 2023-11-18 – 2023-11-27 (×10): 40 mg via ORAL
  Filled 2023-11-17 (×10): qty 1

## 2023-11-17 MED ORDER — LEVALBUTEROL HCL 0.63 MG/3ML IN NEBU: 0.6300 mg | INHALATION_SOLUTION | Freq: Four times a day (QID) | RESPIRATORY_TRACT | Status: DC

## 2023-11-17 MED ORDER — FUROSEMIDE 10 MG/ML IJ SOLN
80.0000 mg | Freq: Once | INTRAMUSCULAR | Status: AC
  Administered 2023-11-17: 80 mg via INTRAVENOUS
  Filled 2023-11-17: qty 8

## 2023-11-17 MED ORDER — ATORVASTATIN CALCIUM 20 MG PO TABS
20.0000 mg | ORAL_TABLET | Freq: Every day | ORAL | Status: DC
  Administered 2023-11-17 – 2023-11-26 (×10): 20 mg via ORAL
  Filled 2023-11-17 (×11): qty 1

## 2023-11-17 MED ORDER — AZITHROMYCIN 250 MG PO TABS
500.0000 mg | ORAL_TABLET | Freq: Every day | ORAL | Status: DC
Start: 2023-11-17 — End: 2023-11-17

## 2023-11-17 MED ORDER — LOSARTAN POTASSIUM 25 MG PO TABS: 12.5000 mg | ORAL_TABLET | Freq: Every day | ORAL | Status: DC

## 2023-11-17 MED ORDER — ACETAMINOPHEN 650 MG RE SUPP: 650.0000 mg | Freq: Four times a day (QID) | RECTAL | Status: DC | PRN

## 2023-11-17 MED ORDER — NOREPINEPHRINE 4 MG/250ML-% IV SOLN: 0.0000 ug/min | INTRAVENOUS | Status: DC

## 2023-11-17 MED ORDER — ISOSORBIDE MONONITRATE ER 60 MG PO TB24: 30.0000 mg | ORAL_TABLET | Freq: Every day | ORAL | Status: DC

## 2023-11-17 MED ORDER — AMIODARONE HCL 200 MG PO TABS
200.0000 mg | ORAL_TABLET | Freq: Every day | ORAL | Status: DC
Start: 2023-11-18 — End: 2023-11-20
  Administered 2023-11-18 – 2023-11-20 (×3): 200 mg via ORAL
  Filled 2023-11-17 (×3): qty 1

## 2023-11-17 MED ORDER — LEVALBUTEROL HCL 0.63 MG/3ML IN NEBU
0.6300 mg | INHALATION_SOLUTION | Freq: Three times a day (TID) | RESPIRATORY_TRACT | Status: DC
  Administered 2023-11-18 – 2023-11-27 (×28): 0.63 mg via RESPIRATORY_TRACT
  Filled 2023-11-17 (×28): qty 3

## 2023-11-17 MED ORDER — ARFORMOTEROL TARTRATE 15 MCG/2ML IN NEBU
15.0000 ug | INHALATION_SOLUTION | Freq: Two times a day (BID) | RESPIRATORY_TRACT | Status: DC
  Administered 2023-11-17 – 2023-11-27 (×20): 15 ug via RESPIRATORY_TRACT
  Filled 2023-11-17 (×20): qty 2

## 2023-11-17 MED ORDER — LEVOTHYROXINE SODIUM 100 MCG PO TABS
200.0000 ug | ORAL_TABLET | Freq: Every day | ORAL | Status: DC
  Administered 2023-11-19: 200 ug via ORAL
  Filled 2023-11-17: qty 2

## 2023-11-17 MED ORDER — ONDANSETRON HCL 4 MG PO TABS
4.0000 mg | ORAL_TABLET | Freq: Four times a day (QID) | ORAL | Status: DC | PRN
  Filled 2023-11-17: qty 1

## 2023-11-17 MED ORDER — METOPROLOL SUCCINATE ER 25 MG PO TB24: 100.0000 mg | ORAL_TABLET | Freq: Every day | ORAL | Status: DC

## 2023-11-17 MED ORDER — ONDANSETRON HCL 4 MG/2ML IJ SOLN: 4.0000 mg | Freq: Four times a day (QID) | INTRAMUSCULAR | Status: DC | PRN

## 2023-11-17 MED ORDER — NOREPINEPHRINE 4 MG/250ML-% IV SOLN
2.0000 ug/min | INTRAVENOUS | Status: DC
  Filled 2023-11-17 (×2): qty 250

## 2023-11-17 MED ORDER — IPRATROPIUM-ALBUTEROL 0.5-2.5 (3) MG/3ML IN SOLN
3.0000 mL | Freq: Four times a day (QID) | RESPIRATORY_TRACT | Status: DC
  Administered 2023-11-17: 3 mL via RESPIRATORY_TRACT
  Filled 2023-11-17: qty 3

## 2023-11-17 MED ORDER — ASPIRIN 81 MG PO TBEC
81.0000 mg | DELAYED_RELEASE_TABLET | Freq: Every day | ORAL | Status: DC
  Administered 2023-11-18 – 2023-11-27 (×10): 81 mg via ORAL
  Filled 2023-11-17 (×10): qty 1

## 2023-11-17 MED ORDER — DABIGATRAN ETEXILATE MESYLATE 150 MG PO CAPS
150.0000 mg | ORAL_CAPSULE | Freq: Two times a day (BID) | ORAL | Status: DC
  Administered 2023-11-17 – 2023-11-18 (×2): 150 mg via ORAL
  Filled 2023-11-17 (×3): qty 1

## 2023-11-17 MED ORDER — SODIUM CHLORIDE 0.9 % IV SOLN: 250.0000 mL | INTRAVENOUS | Status: AC

## 2023-11-17 NOTE — ED Triage Notes (Signed)
 Pt here from [facility] home ( [facility] ) with c/o resp distress , pt has been on antibiotics for approx 10 days , found to have a productive cough and work of breathing today, received 125mg  solumedrol , albuterol and Atrovent and mag from ems , pt sats in the 70's on room air , placed on 5 liters n/c sats up to 90 %

## 2023-11-17 NOTE — Telephone Encounter (Signed)
 Pt is having some issues with his home transmitting equipment and is requesting a cb

## 2023-11-17 NOTE — Telephone Encounter (Signed)
 Daughter called back due to patient was insistent that he pushed button on his device and spoke with tech support person last night and they read him a book.  Advised the button on this home monitor is the only button that can be pressed and this is for sending remote transmissions.  There are no buttons on the monitor or device that can call a tech support person.  She verbalized understanding and just wanted to confirm the information.

## 2023-11-17 NOTE — Progress Notes (Signed)
 Patient taken off Bipap for awhile to see how he does off Bipap.  Patient is currently on 5L humidified; RN made aware.

## 2023-11-17 NOTE — Telephone Encounter (Signed)
 Patient urine specimen was obtained 11/14/2023.

## 2023-11-17 NOTE — ED Provider Notes (Signed)
 [facility] AT [facility] Provider Note   CSN: [id number] Arrival date & time: 11/17/23  1153     History  Chief Complaint  Patient presents with   Respiratory Distress    Justin [name] is a 89 y.o. male.  Patient is an 89 year old male with a history of CAD status post CABG, hyperlipidemia, hypertension, CHF, paroxysmal atrial fibrillation on Eliquis and status post pacemaker who is presenting today with complaints of shortness of breath.  Patient has had cough and shortness of breath over the last 3 weeks which has been gradually worsening.  He reports over the last 2 to days it has been severe and even with mild exertion he cannot catch his breath.  He reports being treated for a UTI with antibiotics and that did resolve but reports he is continued to have a cough.  He went on doxycycline last week and was given prednisone and feels like the mucus is breaking up but he is still feeling short of breath.  He has not had a fever but does report some occasional dry heaves.  No abdominal pain or chest pain.  He has not sure if he has had any swelling in his lower extremities.  He has been compliant with his medications including spironolactone and torsemide.  EMS reports when they arrived patient sats were in the 70s and he does not normally wear oxygen.  He received 5 of albuterol, 0.5 of Atrovent, 2 g of magnesium and Solu-Medrol with mild improvement.  The history is provided by the patient, a relative and medical records.       Home Medications Prior to Admission medications   Medication Sig Start Date End Date Taking? Authorizing Provider  albuterol (VENTOLIN HFA) 108 (90 Base) MCG/ACT inhaler Inhale 2 puffs into the lungs every 4 (four) hours as needed for wheezing or shortness of breath. 05/06/23   Nelwyn Salisbury, MD  allopurinol (ZYLOPRIM) 100 MG tablet Take 1 tablet (100 mg total) by mouth daily. 09/18/23   Nelwyn Salisbury, MD  amiodarone (PACERONE)  200 MG tablet Take 2 tablets by mouth twice daily x 2 weeks then decrease to 2 tablets by mouth daily x 2 weeks 11/13/22   Duke Salvia, MD  aspirin 81 MG tablet Take 1 tablet (81 mg total) by mouth daily. 05/19/19   Magnant, Charles L, PA-C  atorvastatin (LIPITOR) 20 MG tablet TAKE ONE TABLET ONCE DAILY 10/29/23   Kathleene Hazel, MD  benzonatate (TESSALON) 200 MG capsule Take 1 capsule (200 mg total) by mouth 2 (two) times daily as needed for cough. 05/06/23   Nelwyn Salisbury, MD  cetirizine (ZYRTEC) 10 MG tablet Take 10 mg by mouth 2 (two) times daily.    [provider]  colchicine 0.6 MG tablet Take 1 tablet (0.6 mg total) by mouth every 6 (six) hours as needed (gout). 09/18/23 09/17/24  Nelwyn Salisbury, MD  Continuous Glucose Receiver (DEXCOM G6 RECEIVER) DEVI 1 Device by Does not apply route 3 (three) times daily. E11.69 10/23/23   Mast, Man X, NP  Continuous Glucose Sensor (DEXCOM G6 SENSOR) MISC 1 Device by Does not apply route 3 (three) times daily. E11.69 10/23/23   Mast, Man X, NP  Continuous Glucose Transmitter (DEXCOM G6 TRANSMITTER) MISC 1 Device by Does not apply route 3 (three) times daily. E11.69 10/23/23   Mast, Man X, NP  dabigatran (PRADAXA) 150 MG CAPS capsule TAKE (1) CAPSULE TWICE DAILY. 10/24/23  Kathleene Hazel, MD  dapagliflozin propanediol (FARXIGA) 10 MG TABS tablet Take 10 mg by mouth daily before breakfast.    [provider]  dextromethorphan-guaiFENesin (MUCINEX DM) 30-600 MG 12hr tablet Take 1 tablet by mouth 2 (two) times daily for 14 days. 11/13/23 11/27/23  Medina-Vargas, Monina C, NP  doxycycline (VIBRA-TABS) 100 MG tablet Take 1 tablet (100 mg total) by mouth 2 (two) times daily for 7 days. 11/13/23 11/20/23  Medina-Vargas, Monina C, NP  isosorbide mononitrate (IMDUR) 30 MG 24 hr tablet Take 1 tablet (30 mg total) by mouth daily. 09/02/23 12/01/23  Kathleene Hazel, MD  ketotifen (ZADITOR) 0.025 % ophthalmic solution Place 1 drop into both  eyes daily as needed (for itching).    [provider]  levothyroxine (SYNTHROID) 200 MCG tablet Take 1 tablet (200 mcg total) by mouth daily. 12/25/22   Nelwyn Salisbury, MD  losartan (COZAAR) 25 MG tablet Take 0.5 tablets (12.5 mg total) by mouth daily. 07/06/23 07/05/24  Dorcas Carrow, MD  magic mouthwash SOLN Take 5 mLs by mouth as directed. Suspension contains equal amounts of Maalox Extra Strength, nystatin, and diphenhydramine. RX'ed by dentist    [provider]  methocarbamol (ROBAXIN) 500 MG tablet Take 1 tablet (500 mg total) by mouth 4 (four) times daily as needed for muscle spasms. 07/07/23   London Sheer, MD  metoCLOPramide (REGLAN) 10 MG tablet TAKE ONE TABLET BY MOUTH EVERY MORNING WITH BREAKFAST 08/29/23   Nelwyn Salisbury, MD  metolazone (ZAROXOLYN) 2.5 MG tablet Take 1 tablet (2.5 mg total) by mouth once a week every Saturday 10/27/23   Duke Salvia, MD  metoprolol succinate (TOPROL-XL) 50 MG 24 hr tablet Take 2 tablets (100 mg total) by mouth daily. Take with or immediately following a meal. 05/30/23   Duke Salvia, MD  montelukast (SINGULAIR) 10 MG tablet TAKE ONE TABLET BY MOUTH ONCE DAILY IN THE EVENING 08/20/22   Hunsucker, Lesia Sago, MD  Multiple Minerals-Vitamins (CAL MAG ZINC +D3 PO) Take 2 tablets by mouth in the morning. 500 mg / 7 mg / 25 mcg    [provider]  Multiple Vitamins-Minerals (MULTI-VITAMIN GUMMIES) CHEW Chew 2 tablets by mouth See admin instructions. Chew 2 gummies by mouth in the morning    [provider]  Multiple Vitamins-Minerals (PRESERVISION AREDS 2) CAPS Take 1 capsule by mouth in the morning and at bedtime.    [provider]  omeprazole (PRILOSEC) 20 MG capsule TAKE ONE CAPSULE TWICE DAILY 08/01/23   Nelwyn Salisbury, MD  pantoprazole (PROTONIX) 40 MG tablet Take 1 tablet (40 mg total) by mouth daily. 10/23/23   Mast, Man X, NP  potassium chloride SA (KLOR-CON M) 20 MEQ tablet Take 1 tablet (20 mEq total) by  mouth 2 (two) times daily. 02/18/23   Graciella Freer, PA-C  predniSONE (DELTASONE) 20 MG tablet Take 1 tablet (20 mg total) by mouth daily with breakfast for 5 days. 11/13/23 11/18/23  Medina-Vargas, Monina C, NP  silver sulfADIAZINE (SILVADENE) 1 % cream Apply 1 Application topically 2 (two) times daily. 05/14/23   Nelwyn Salisbury, MD  spironolactone (ALDACTONE) 25 MG tablet Take 1 tablet (25 mg total) by mouth daily. 10/24/23   Kathleene Hazel, MD  torsemide (DEMADEX) 20 MG tablet Take 2 tablets (40 mg total) every other day, alternating with 1 tablet (20 mg total) every other day. 02/18/23   Graciella Freer, PA-C  triamcinolone cream (KENALOG) 0.1 % Apply 1  application  topically daily as needed for dry skin (affected areas). 04/15/17   [provider]  valACYclovir (VALTREX) 500 MG tablet Take 500 mg by mouth 2 (two) times daily as needed (cold sores).    [provider]  venlafaxine XR (EFFEXOR XR) 37.5 MG 24 hr capsule Take 1 capsule (37.5 mg total) by mouth daily with breakfast. 10/23/23   Mast, Man X, NP      Allergies    Peanut-containing drug products, Sulfonamide derivatives, Amlodipine, Eliquis [apixaban], Lisinopril, and Xarelto [rivaroxaban]    Review of Systems   Review of Systems  Physical Exam Updated Vital Signs BP 119/68   Pulse 94   Temp 97.9 F (36.6 C) (Oral)   Resp (!) 33   SpO2 95%  Physical Exam Vitals and nursing note reviewed.  Constitutional:      General: He is not in acute distress.    Appearance: He is well-developed.  HENT:     Head: Normocephalic and atraumatic.     Mouth/Throat:     Mouth: Mucous membranes are dry.  Eyes:     Conjunctiva/sclera: Conjunctivae normal.     Pupils: Pupils are equal, round, and reactive to light.  Cardiovascular:     Rate and Rhythm: Tachycardia present. Rhythm irregular.     Pulses: Normal pulses.     Heart sounds: No murmur heard. Pulmonary:     Effort: Tachypnea and accessory  muscle usage present. No respiratory distress.     Breath sounds: Decreased air movement present. Wheezing present. No rales.  Abdominal:     General: There is no distension.     Palpations: Abdomen is soft.     Tenderness: There is no abdominal tenderness. There is no guarding or rebound.  Musculoskeletal:        General: No tenderness. Normal range of motion.     Cervical back: Normal range of motion and neck supple.     Right lower leg: Edema present.     Left lower leg: Edema present.     Comments: Pitting edema noted in bilateral ankles  Skin:    General: Skin is warm and dry.     Findings: No erythema or rash.  Neurological:     Mental Status: He is alert and oriented to person, place, and time. Mental status is at baseline.  Psychiatric:        Mood and Affect: Mood normal.        Behavior: Behavior normal.     ED Results / Procedures / Treatments   Labs (all labs ordered are listed, but only abnormal results are displayed) Labs Reviewed  COMPREHENSIVE METABOLIC PANEL - Abnormal; Notable for the following components:      Result Value   Glucose, Bld 127 (*)    BUN 41 (*)    Creatinine, Ser 1.42 (*)    Calcium 8.3 (*)    Albumin 2.2 (*)    AST 53 (*)    Alkaline Phosphatase 135 (*)    Total Bilirubin 1.7 (*)    GFR, Estimated 48 (*)    All other components within normal limits  BLOOD GAS, VENOUS - Abnormal; Notable for the following components:   pO2, Ven <31 (*)    Bicarbonate 29.5 (*)    Acid-Base Excess 3.1 (*)    All other components within normal limits  CBC WITH DIFFERENTIAL/PLATELET - Abnormal; Notable for the following components:   WBC 29.7 (*)    Hemoglobin 12.8 (*)  RDW 18.7 (*)    Neutro Abs 24.2 (*)    Monocytes Absolute 1.9 (*)    Abs Immature Granulocytes 0.36 (*)    All other components within normal limits  BRAIN NATRIURETIC PEPTIDE - Abnormal; Notable for the following components:   B Natriuretic Peptide 880.6 (*)    All other components  within normal limits  TROPONIN I (HIGH SENSITIVITY)  TROPONIN I (HIGH SENSITIVITY)    EKG EKG Interpretation Date/Time:  Monday November 17 2023 12:23:56 EDT Ventricular Rate:  94 PR Interval:    QRS Duration:  157 QT Interval:  452 QTC Calculation: 566 R Axis:   235  Text Interpretation: VENTRICULAR PACED RHYTHM Paired ventricular premature complexes Right bundle branch block No significant change since last tracing Confirmed by Gwyneth Sprout ([id number]) on 11/17/2023 1:48:43 PM  Radiology No results found.  Procedures Procedures    Medications Ordered in ED Medications  albuterol (PROVENTIL) (2.5 MG/3ML) 0.083% nebulizer solution (10 mg/hr Nebulization Given 11/17/23 1222)  furosemide (LASIX) injection 80 mg (80 mg Intravenous Given 11/17/23 1345)    ED Course/ Medical Decision Making/ A&P                                 Medical Decision Making Amount and/or Complexity of Data Reviewed Labs: ordered. Radiology: ordered.  Risk Prescription drug management. Decision regarding hospitalization.   Pt with multiple medical problems and comorbidities and presenting today with a complaint that caries a high risk for morbidity and mortality.  Here today with complaints of shortness of breath and hypoxia.  Concern for CHF exacerbation versus pneumonia versus COPD exacerbation.  Patient's daughter reports he has been very confused lately and could be related to his hypoxia or possible hypercarbia.  Patient has no abdominal pain or chest pain at this time however also concern for possible ACS.  Lower suspicion for PE as patient is anticoagulated for his atrial fibrillation. I independently interpreted patient's EKG and labs.  EKG shows a paced ventricular rhythm that is tachycardic with occasional PVCs, venous blood gas with hypoxia but no other acute abnormalities, BNP elevated today at 880 from his baseline in the 300s, CMP without significant findings and stable creatinine of 1.4, CBC  with leukocytosis of 29 from his baseline between 13 and 14 but could be related to recent steroid use, hemoglobin is stable, troponin is within normal limits today. I have independently visualized and interpreted pt's images today.  Chest x-ray today with significant pulmonary edema and radiology reports increased interstitial markings right greater than left which could be pulmonary edema versus atypical pneumonia.  Patient has been treated with 2 rounds of antibiotics and is not having specific infectious symptoms at this time and feel more likely this is fluid.  Also trace effusions noted.  On repeat evaluation patient was on high flow nasal cannula at 8 but still was desatting with increased work of breathing and was transition to BiPAP.  Patient is now breathing comfortably with sats at 95% and he is now sleeping.  He was given IV Lasix 80 mg admission for further care.  Findings discussed with the patient's daughter and she is comfortable with this plan.  Patient is a DNR/DNI  CRITICAL CARE Performed by: Liliya Fullenwider Total critical care time: 30 minutes Critical care time was exclusive of separately billable procedures and treating other patients. Critical care was necessary to treat or prevent imminent or life-threatening deterioration. Critical care  was time spent personally by me on the following activities: development of treatment plan with patient and/or surrogate as well as nursing, discussions with consultants, evaluation of patient's response to treatment, examination of patient, obtaining history from patient or surrogate, ordering and performing treatments and interventions, ordering and review of laboratory studies, ordering and review of radiographic studies, pulse oximetry and re-evaluation of patient's condition.           Final Clinical Impression(s) / ED Diagnoses Final diagnoses:  Acute on chronic congestive heart failure, unspecified heart failure type (HCC)  Acute  respiratory failure with hypoxia [facility])    Rx / DC Orders ED Discharge Orders     None         Gwyneth Sprout, MD 11/17/23 1450

## 2023-11-17 NOTE — H&P (Signed)
 History and Physical    Justin [name] ZOX:[id number] DOB: 1934/08/22 DOA: 11/17/2023  PCP: Mast, Man X, NP   Chief Complaint:  sob  HPI: Justin [name] is a 89 y.o. male with medical history significant of BPH, CAD status post CABG, CHF, hypertension, hyperlipidemia, hypothyroidism, aortic stenosis status post TAVR, paroxysmal A-fib who presented to the emergency department due to shortness of breath.  Patient has been having worsening fatigue and hypoxia at home.  He saw his primary care doctor who diagnosed him with bronchitis.  He was given amoxicillin and due to failure to improve was switched to doxycycline and prednisone.  He presented to the ER for further assessment.  On arrival he was hypoxic in the 70s and placed on nonrebreather.  Due to increased work of breathing he was transitioned to BiPAP.  Labs were obtained which showed creatinine 1.4, AST 53, ALT 38, total bilirubin 1.7, WBC 29.7, hemoglobin 12.8.  Troponin 14, pH 7.37, BNP 880.  Chest x-ray showed increased interstitial markings concerning for pneumonia/pulm edema.  Patient was given IV Lasix and started on antibiotics.   Review of Systems: Review of Systems  Constitutional:  Positive for malaise/fatigue. Negative for chills and fever.  HENT: Negative.    Eyes: Negative.   Respiratory:  Positive for sputum production and shortness of breath.   Cardiovascular:  Positive for palpitations.  Gastrointestinal: Negative.   Genitourinary: Negative.   Musculoskeletal: Negative.   Skin: Negative.   Neurological: Negative.   Endo/Heme/Allergies: Negative.   Psychiatric/Behavioral: Negative.    All other systems reviewed and are negative.    As per HPI otherwise 10 point review of systems negative.   Allergies  Allergen Reactions   Peanut-Containing Drug Products Anaphylaxis   Sulfonamide Derivatives Anaphylaxis   Amlodipine Swelling and Other (See Comments)    Swelling in ankles   Eliquis [Apixaban] Other (See  Comments)    Back/hip pain   Lisinopril Cough   Xarelto [Rivaroxaban] Other (See Comments)    Back/hip pain    Past Medical History:  Diagnosis Date   Age-related macular degeneration, dry, both eyes    Allergic    "24/7; 365 days/year; I'm allergic to pollens, dust, all southern grasses/trees, mold, mildue, cats, dogs" (01/07/2017)   Anal fissure    Asthma    sees Dr. Kendrick Fries    Benign prostatic hypertrophy    (sees Dr. Chester Holstein   CAD (coronary artery disease)    a. s/p CABG 2007. b. Cath 08/2016 - 4/4 patent grafts.   Carotid bruit    carotid u/s 10/10: 0.39% bilaterally   Chronic combined systolic and diastolic CHF (congestive heart failure) (HCC)    Complication of anesthesia 1980s   "w/anal cyst OR, he gave me a saddle block then put a narcotic in spinal cord; had a severe reaction to that" (01/07/2017)   Congestive heart failure (CHF) [facility])    ED (erectile dysfunction)    Family history of adverse reaction to anesthesia    "daughter wakes up during OR" (01/07/2017)   GERD (gastroesophageal reflux disease)    Gout    HTN (hypertension)    Hx of colonic polyps    (sees Dr. Marina Goodell)   Hyperlipidemia    Hypothyroidism    Moderate to severe aortic stenosis    a. s/p TAVR 02/2017.   Myocardial infarction [facility]) ~ 2000   Obesity    Osteoarthritis    "was in my knees, hands" (01/07/2017 )   PAF (paroxysmal atrial fibrillation) (HCC)  a. documented post TAVR.   Precancerous skin lesion    (sees Dr. 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   29 mm Edwards Sapien 3 transcatheter heart valve placed via percutaneous right transfemoral approach    Past Surgical History:  Procedure Laterality Date   ANUS SURGERY     "opened it back up cause it wouldn't heal; wound up w/a fissure" (01/07/2017)   BIV ICD INSERTION CRT-D N/A 05/02/2017   Procedure: BIV ICD INSERTION CRT-D;  Surgeon: Duke Salvia, MD;  Location: [facility] INVASIVE CV LAB;  Service: Cardiovascular;  Laterality: N/A;   CARDIAC CATHETERIZATION  10/29/2005   CARDIAC CATHETERIZATION N/A 08/27/2016   Procedure: Right/Left Heart Cath and Coronary/Graft Angiography;  Surgeon: Kathleene Hazel, MD;  Location: [facility] INVASIVE CV LAB;  Service: Cardiovascular;  Laterality: N/A;   CARDIOVERSION N/A 04/24/2021   Procedure: CARDIOVERSION;  Surgeon: Pricilla Riffle, MD;  Location: [facility] ENDOSCOPY;  Service: Cardiovascular;  Laterality: N/A;   CARDIOVERSION N/A 11/08/2021   Procedure: CARDIOVERSION;  Surgeon: Chrystie Nose, MD;  Location: [facility] ENDOSCOPY;  Service: Cardiovascular;  Laterality: N/A;   CARDIOVERSION N/A 02/06/2022   Procedure: CARDIOVERSION;  Surgeon: Wendall Stade, MD;  Location: [facility] ENDOSCOPY;  Service: Cardiovascular;  Laterality: N/A;   CATARACT EXTRACTION W/ INTRAOCULAR LENS  IMPLANT, BILATERAL Bilateral    CATARACT EXTRACTION, BILATERAL  2012   COLONOSCOPY  06/30/2008   no repeats needed    COLONOSCOPY     had 3 or 4 in the past    CORONARY ARTERY BYPASS GRAFT  2007   "CABG X4"   CYST EXCISION PERINEAL  1980s   HAMMER TOE SURGERY Bilateral    ICD GENERATOR CHANGEOUT N/A 05/30/2023   Procedure: ICD GENERATOR CHANGEOUT;  Surgeon: Duke Salvia, MD;  Location: [facility] INVASIVE CV LAB;  Service: Cardiovascular;  Laterality: N/A;   JOINT REPLACEMENT     KNEE ARTHROPLASTY  07/30/2011   Procedure: COMPUTER ASSISTED TOTAL KNEE ARTHROPLASTY;  Surgeon: Cammy Copa;  Location: [facility] OR;  Service: Orthopedics;  Laterality: Left;  left total knee arthroplasty   MASTECTOMY SUBCUTANEOUS Bilateral    MULTIPLE TOOTH EXTRACTIONS     ORIF FINGER / THUMB FRACTURE Right ~ 1980   "repair of thumb injury"   PROSTATE BIOPSY     REPLACEMENT TOTAL KNEE BILATERAL Bilateral 2012   TEE WITHOUT CARDIOVERSION N/A 03/04/2017   Procedure: TRANSESOPHAGEAL ECHOCARDIOGRAM (TEE);  Surgeon: Kathleene Hazel, MD;  Location[facility] OR;  Service: Open  Heart Surgery;  Laterality: N/A;   TOTAL KNEE REVISION Right 05/18/2019   Procedure: RIGHT PATELLA REVISION/REMOVAL;  Surgeon: Cammy Copa, MD;  Location: [facility] OR;  Service: Orthopedics;  Laterality: Right;   TRANSCATHETER AORTIC VALVE REPLACEMENT, TRANSFEMORAL N/A 03/04/2017   Procedure: TRANSCATHETER AORTIC VALVE REPLACEMENT, TRANSFEMORAL;  Surgeon: Kathleene Hazel, MD;  Location: [facility] OR;  Service: Open Heart Surgery;  Laterality: N/A;     reports that he quit smoking about 62 years ago. His smoking use included cigarettes. He started smoking about 75 years ago. He has a 45.5 pack-year smoking history. He has never used smokeless tobacco. He reports that he does not currently use alcohol after a past usage of about 7.0 standard drinks of alcohol per week. He reports that he does not use drugs.  Family History  Problem Relation Age of Onset   Heart attack Father 27  Allergic rhinitis Father    Asthma Father    Heart failure Mother 41   Uterine cancer Mother    Breast cancer Mother    Colon cancer Neg Hx    Esophageal cancer Neg Hx     Prior to Admission medications   Medication Sig Start Date End Date Taking? Authorizing Provider  albuterol (VENTOLIN HFA) 108 (90 Base) MCG/ACT inhaler Inhale 2 puffs into the lungs every 4 (four) hours as needed for wheezing or shortness of breath. 05/06/23   Nelwyn Salisbury, MD  allopurinol (ZYLOPRIM) 100 MG tablet Take 1 tablet (100 mg total) by mouth daily. 09/18/23   Nelwyn Salisbury, MD  amiodarone (PACERONE) 200 MG tablet Take 2 tablets by mouth twice daily x 2 weeks then decrease to 2 tablets by mouth daily x 2 weeks 11/13/22   Duke Salvia, MD  aspirin 81 MG tablet Take 1 tablet (81 mg total) by mouth daily. 05/19/19   Magnant, Charles L, PA-C  atorvastatin (LIPITOR) 20 MG tablet TAKE ONE TABLET ONCE DAILY 10/29/23   Kathleene Hazel, MD  benzonatate (TESSALON) 200 MG capsule Take 1 capsule (200 mg total) by mouth 2 (two) times daily  as needed for cough. 05/06/23   Nelwyn Salisbury, MD  cetirizine (ZYRTEC) 10 MG tablet Take 10 mg by mouth 2 (two) times daily.    [provider]  colchicine 0.6 MG tablet Take 1 tablet (0.6 mg total) by mouth every 6 (six) hours as needed (gout). 09/18/23 09/17/24  Nelwyn Salisbury, MD  Continuous Glucose Receiver (DEXCOM G6 RECEIVER) DEVI 1 Device by Does not apply route 3 (three) times daily. E11.69 10/23/23   Mast, Man X, NP  Continuous Glucose Sensor (DEXCOM G6 SENSOR) MISC 1 Device by Does not apply route 3 (three) times daily. E11.69 10/23/23   Mast, Man X, NP  Continuous Glucose Transmitter (DEXCOM G6 TRANSMITTER) MISC 1 Device by Does not apply route 3 (three) times daily. E11.69 10/23/23   Mast, Man X, NP  dabigatran (PRADAXA) 150 MG CAPS capsule TAKE (1) CAPSULE TWICE DAILY. 10/24/23   Kathleene Hazel, MD  dapagliflozin propanediol (FARXIGA) 10 MG TABS tablet Take 10 mg by mouth daily before breakfast.    [provider]  dextromethorphan-guaiFENesin (MUCINEX DM) 30-600 MG 12hr tablet Take 1 tablet by mouth 2 (two) times daily for 14 days. 11/13/23 11/27/23  Medina-Vargas, Monina C, NP  doxycycline (VIBRA-TABS) 100 MG tablet Take 1 tablet (100 mg total) by mouth 2 (two) times daily for 7 days. 11/13/23 11/20/23  Medina-Vargas, Monina C, NP  isosorbide mononitrate (IMDUR) 30 MG 24 hr tablet Take 1 tablet (30 mg total) by mouth daily. 09/02/23 12/01/23  Kathleene Hazel, MD  ketotifen (ZADITOR) 0.025 % ophthalmic solution Place 1 drop into both eyes daily as needed (for itching).    [provider]  levothyroxine (SYNTHROID) 200 MCG tablet Take 1 tablet (200 mcg total) by mouth daily. 12/25/22   Nelwyn Salisbury, MD  losartan (COZAAR) 25 MG tablet Take 0.5 tablets (12.5 mg total) by mouth daily. 07/06/23 07/05/24  Dorcas Carrow, MD  magic mouthwash SOLN Take 5 mLs by mouth as directed. Suspension contains equal amounts of Maalox Extra Strength, nystatin, and  diphenhydramine. RX'ed by dentist    [provider]  methocarbamol (ROBAXIN) 500 MG tablet Take 1 tablet (500 mg total) by mouth 4 (four) times daily as needed for muscle spasms. 07/07/23   London Sheer, MD  metoCLOPramide [facility])  10 MG tablet TAKE ONE TABLET BY MOUTH EVERY MORNING WITH BREAKFAST 08/29/23   Nelwyn Salisbury, MD  metolazone (ZAROXOLYN) 2.5 MG tablet Take 1 tablet (2.5 mg total) by mouth once a week every Saturday 10/27/23   Duke Salvia, MD  metoprolol succinate (TOPROL-XL) 50 MG 24 hr tablet Take 2 tablets (100 mg total) by mouth daily. Take with or immediately following a meal. 05/30/23   Duke Salvia, MD  montelukast (SINGULAIR) 10 MG tablet TAKE ONE TABLET BY MOUTH ONCE DAILY IN THE EVENING 08/20/22   Hunsucker, Lesia Sago, MD  Multiple Minerals-Vitamins (CAL MAG ZINC +D3 PO) Take 2 tablets by mouth in the morning. 500 mg / 7 mg / 25 mcg    [provider]  Multiple Vitamins-Minerals (MULTI-VITAMIN GUMMIES) CHEW Chew 2 tablets by mouth See admin instructions. Chew 2 gummies by mouth in the morning    [provider]  Multiple Vitamins-Minerals (PRESERVISION AREDS 2) CAPS Take 1 capsule by mouth in the morning and at bedtime.    [provider]  omeprazole (PRILOSEC) 20 MG capsule TAKE ONE CAPSULE TWICE DAILY 08/01/23   Nelwyn Salisbury, MD  pantoprazole (PROTONIX) 40 MG tablet Take 1 tablet (40 mg total) by mouth daily. 10/23/23   Mast, Man X, NP  potassium chloride SA (KLOR-CON M) 20 MEQ tablet Take 1 tablet (20 mEq total) by mouth 2 (two) times daily. 02/18/23   Graciella Freer, PA-C  predniSONE (DELTASONE) 20 MG tablet Take 1 tablet (20 mg total) by mouth daily with breakfast for 5 days. 11/13/23 11/18/23  Medina-Vargas, Monina C, NP  silver sulfADIAZINE (SILVADENE) 1 % cream Apply 1 Application topically 2 (two) times daily. 05/14/23   Nelwyn Salisbury, MD  spironolactone (ALDACTONE) 25 MG tablet Take 1 tablet (25 mg total) by mouth daily.  10/24/23   Kathleene Hazel, MD  torsemide (DEMADEX) 20 MG tablet Take 2 tablets (40 mg total) every other day, alternating with 1 tablet (20 mg total) every other day. 02/18/23   Graciella Freer, PA-C  triamcinolone cream (KENALOG) 0.1 % Apply 1 application  topically daily as needed for dry skin (affected areas). 04/15/17   [provider]  valACYclovir (VALTREX) 500 MG tablet Take 500 mg by mouth 2 (two) times daily as needed (cold sores).    [provider]  venlafaxine XR (EFFEXOR XR) 37.5 MG 24 hr capsule Take 1 capsule (37.5 mg total) by mouth daily with breakfast. 10/23/23   Mast, Man X, NP    Physical Exam: Vitals:   11/17/23 1231 11/17/23 1330 11/17/23 1410 11/17/23 1445  BP:  119/68  (!) 87/62  Pulse:  (!) 101 94 92  Resp:  (!) 28 (!) 33 (!) 21  Temp:      TempSrc:      SpO2: 95% 94% 95% 96%   Physical Exam Constitutional:      Appearance: He is normal weight.  HENT:     Head: Normocephalic.     Nose: Nose normal.     Mouth/Throat:     Mouth: Mucous membranes are moist.     Pharynx: Oropharynx is clear.  Eyes:     Conjunctiva/sclera: Conjunctivae normal.     Pupils: Pupils are equal, round, and reactive to light.  Cardiovascular:     Rate and Rhythm: Normal rate and regular rhythm.     Pulses: Normal pulses.     Heart sounds: Normal heart sounds.  Pulmonary:     Effort:  Pulmonary effort is normal. No respiratory distress.     Breath sounds: Normal breath sounds.  Abdominal:     General: Abdomen is flat. Bowel sounds are normal.  Musculoskeletal:        General: Normal range of motion.  Skin:    General: Skin is warm.     Capillary Refill: Capillary refill takes less than 2 seconds.  Neurological:     General: No focal deficit present.     Mental Status: He is alert. Mental status is at baseline.  Psychiatric:        Mood and Affect: Mood normal.        Behavior: Behavior normal.    Labs on Admission: I have personally reviewed  the patients's labs and imaging studies.  Assessment/Plan Principal Problem:   Acute hypoxic respiratory failure (HCC)   # Acute hypoxic respiratory failure most likely secondary to CHF exacerbation and/or nosocomial/bacterial community acquired pneumonia #Severe sepsis 2/2 CAP -Leukocytosis - Volume overload on exam - Opacities on chest x-ray - Will need to rule out PE - On torsemide, metolazone, Farxiga  Plan: Obtain CTA pulm embolism study Start vancomycin and cefepime Blood gas shows no hypercarbia so can take off BiPAP Diurese IV Lasix Patient   # Depression-continue venlafaxine  # CKD stage IIIb-trend creatinine  # Paroxysmal A-fib-on Pradaxa and metoprolol.  Will continue anticoagulation and hold metoprolol in setting of possible CHF exacerbation. Continue amiodarone  # CAD status post CABG-continue goal-directed medical therapy, continue asa, imdur  # Type 2 diabetes-patient has history of A1c however not on insulin.  Will continue to monitor  Admission status: Observation Progressive  Certification: The appropriate patient status for this patient is OBSERVATION. Observation status is judged to be reasonable and necessary in order to provide the required intensity of service to ensure the patient's safety. The patient's presenting symptoms, physical exam findings, and initial radiographic and laboratory data in the context of their medical condition is felt to place them at decreased risk for further clinical deterioration. Furthermore, it is anticipated that the patient will be medically stable for discharge from the hospital within 2 midnights of admission.     Alan Mulder MD [facility] If 7PM-7AM, please contact night-coverage [email or web address]  11/17/2023, 3:37 PM

## 2023-11-17 NOTE — Progress Notes (Signed)
 PATIENT TRANSPORTED ON BIPAP V60 TO 1404 WITH NO COMPLICATIONS. THE PATIENT IS STILL ON BIPAP AT THIS TIME.

## 2023-11-17 NOTE — Progress Notes (Signed)
 Had to place patient on [place] HFNC at 13L.  Current sat is 95%.  Will continue to monitor and probably will place patient back on Bipap tonight before sleeping.

## 2023-11-17 NOTE — ED Notes (Signed)
 Respiratory called

## 2023-11-17 NOTE — Telephone Encounter (Signed)
 Returned call to daughter Donnell Wion as requested by voice mail message.  She stated patient reported was hearing someone talk to him through his device last night between 1-4 AM and reading a book to him to help turn off the device noise.  She stated patient has been having some hallucinations but she was unsure what he was talking about regarding the device.  Advised the device can only make 2 sounds, one is a tone when the battery needs replacing and the other is like a siren sound if there is a problem. Advised no alerts have been received yesterday or today.  Advised a person can't talk to him through his device.  Pt is currently in ER being evaluated due to SOB, difficulty sleeping and having some hallucinations.  She will call back if any further information is needed. Advised the ED can do a device check while he is there being evaluated.

## 2023-11-18 ENCOUNTER — Other Ambulatory Visit: Payer: Self-pay

## 2023-11-18 ENCOUNTER — Inpatient Hospital Stay (HOSPITAL_COMMUNITY)

## 2023-11-18 ENCOUNTER — Observation Stay (HOSPITAL_COMMUNITY)

## 2023-11-18 DIAGNOSIS — I700 Atherosclerosis of aorta: Secondary | ICD-10-CM | POA: Diagnosis present

## 2023-11-18 DIAGNOSIS — R0602 Shortness of breath: Secondary | ICD-10-CM | POA: Diagnosis not present

## 2023-11-18 DIAGNOSIS — J9601 Acute respiratory failure with hypoxia: Secondary | ICD-10-CM | POA: Diagnosis not present

## 2023-11-18 DIAGNOSIS — I10 Essential (primary) hypertension: Secondary | ICD-10-CM | POA: Diagnosis not present

## 2023-11-18 DIAGNOSIS — I130 Hypertensive heart and chronic kidney disease with heart failure and stage 1 through stage 4 chronic kidney disease, or unspecified chronic kidney disease: Secondary | ICD-10-CM | POA: Diagnosis present

## 2023-11-18 DIAGNOSIS — J40 Bronchitis, not specified as acute or chronic: Secondary | ICD-10-CM | POA: Diagnosis not present

## 2023-11-18 DIAGNOSIS — E1122 Type 2 diabetes mellitus with diabetic chronic kidney disease: Secondary | ICD-10-CM | POA: Diagnosis present

## 2023-11-18 DIAGNOSIS — J984 Other disorders of lung: Secondary | ICD-10-CM | POA: Diagnosis not present

## 2023-11-18 DIAGNOSIS — R1312 Dysphagia, oropharyngeal phase: Secondary | ICD-10-CM | POA: Diagnosis not present

## 2023-11-18 DIAGNOSIS — A419 Sepsis, unspecified organism: Secondary | ICD-10-CM | POA: Diagnosis not present

## 2023-11-18 DIAGNOSIS — Z950 Presence of cardiac pacemaker: Secondary | ICD-10-CM | POA: Diagnosis not present

## 2023-11-18 DIAGNOSIS — E119 Type 2 diabetes mellitus without complications: Secondary | ICD-10-CM | POA: Diagnosis not present

## 2023-11-18 DIAGNOSIS — R0603 Acute respiratory distress: Secondary | ICD-10-CM | POA: Diagnosis not present

## 2023-11-18 DIAGNOSIS — I252 Old myocardial infarction: Secondary | ICD-10-CM | POA: Diagnosis not present

## 2023-11-18 DIAGNOSIS — J129 Viral pneumonia, unspecified: Secondary | ICD-10-CM | POA: Diagnosis not present

## 2023-11-18 DIAGNOSIS — N1831 Chronic kidney disease, stage 3a: Secondary | ICD-10-CM

## 2023-11-18 DIAGNOSIS — R140 Abdominal distension (gaseous): Secondary | ICD-10-CM | POA: Diagnosis not present

## 2023-11-18 DIAGNOSIS — E785 Hyperlipidemia, unspecified: Secondary | ICD-10-CM | POA: Diagnosis present

## 2023-11-18 DIAGNOSIS — J45998 Other asthma: Secondary | ICD-10-CM | POA: Diagnosis not present

## 2023-11-18 DIAGNOSIS — M109 Gout, unspecified: Secondary | ICD-10-CM | POA: Diagnosis not present

## 2023-11-18 DIAGNOSIS — K219 Gastro-esophageal reflux disease without esophagitis: Secondary | ICD-10-CM | POA: Diagnosis not present

## 2023-11-18 DIAGNOSIS — I2510 Atherosclerotic heart disease of native coronary artery without angina pectoris: Secondary | ICD-10-CM | POA: Diagnosis not present

## 2023-11-18 DIAGNOSIS — Z6838 Body mass index (BMI) 38.0-38.9, adult: Secondary | ICD-10-CM | POA: Diagnosis not present

## 2023-11-18 DIAGNOSIS — J45909 Unspecified asthma, uncomplicated: Secondary | ICD-10-CM | POA: Diagnosis present

## 2023-11-18 DIAGNOSIS — I517 Cardiomegaly: Secondary | ICD-10-CM | POA: Diagnosis not present

## 2023-11-18 DIAGNOSIS — I4821 Permanent atrial fibrillation: Secondary | ICD-10-CM | POA: Diagnosis not present

## 2023-11-18 DIAGNOSIS — J948 Other specified pleural conditions: Secondary | ICD-10-CM | POA: Diagnosis not present

## 2023-11-18 DIAGNOSIS — E1169 Type 2 diabetes mellitus with other specified complication: Secondary | ICD-10-CM | POA: Diagnosis not present

## 2023-11-18 DIAGNOSIS — I4891 Unspecified atrial fibrillation: Secondary | ICD-10-CM | POA: Diagnosis not present

## 2023-11-18 DIAGNOSIS — I5022 Chronic systolic (congestive) heart failure: Secondary | ICD-10-CM | POA: Diagnosis not present

## 2023-11-18 DIAGNOSIS — I480 Paroxysmal atrial fibrillation: Secondary | ICD-10-CM | POA: Diagnosis not present

## 2023-11-18 DIAGNOSIS — N179 Acute kidney failure, unspecified: Secondary | ICD-10-CM | POA: Diagnosis present

## 2023-11-18 DIAGNOSIS — Z7401 Bed confinement status: Secondary | ICD-10-CM | POA: Diagnosis not present

## 2023-11-18 DIAGNOSIS — K5900 Constipation, unspecified: Secondary | ICD-10-CM | POA: Diagnosis not present

## 2023-11-18 DIAGNOSIS — J189 Pneumonia, unspecified organism: Secondary | ICD-10-CM | POA: Diagnosis not present

## 2023-11-18 DIAGNOSIS — M1990 Unspecified osteoarthritis, unspecified site: Secondary | ICD-10-CM | POA: Diagnosis not present

## 2023-11-18 DIAGNOSIS — B342 Coronavirus infection, unspecified: Secondary | ICD-10-CM

## 2023-11-18 DIAGNOSIS — E876 Hypokalemia: Secondary | ICD-10-CM | POA: Diagnosis not present

## 2023-11-18 DIAGNOSIS — I350 Nonrheumatic aortic (valve) stenosis: Secondary | ICD-10-CM | POA: Diagnosis present

## 2023-11-18 DIAGNOSIS — I472 Ventricular tachycardia, unspecified: Secondary | ICD-10-CM | POA: Diagnosis present

## 2023-11-18 DIAGNOSIS — Z7901 Long term (current) use of anticoagulants: Secondary | ICD-10-CM | POA: Diagnosis not present

## 2023-11-18 DIAGNOSIS — U071 COVID-19: Secondary | ICD-10-CM | POA: Diagnosis present

## 2023-11-18 DIAGNOSIS — J9811 Atelectasis: Secondary | ICD-10-CM | POA: Diagnosis present

## 2023-11-18 DIAGNOSIS — J90 Pleural effusion, not elsewhere classified: Secondary | ICD-10-CM | POA: Diagnosis not present

## 2023-11-18 DIAGNOSIS — A4189 Other specified sepsis: Secondary | ICD-10-CM | POA: Diagnosis present

## 2023-11-18 DIAGNOSIS — I5043 Acute on chronic combined systolic (congestive) and diastolic (congestive) heart failure: Secondary | ICD-10-CM | POA: Diagnosis present

## 2023-11-18 DIAGNOSIS — Z66 Do not resuscitate: Secondary | ICD-10-CM | POA: Diagnosis present

## 2023-11-18 DIAGNOSIS — J309 Allergic rhinitis, unspecified: Secondary | ICD-10-CM | POA: Diagnosis not present

## 2023-11-18 DIAGNOSIS — R252 Cramp and spasm: Secondary | ICD-10-CM | POA: Diagnosis not present

## 2023-11-18 DIAGNOSIS — R918 Other nonspecific abnormal finding of lung field: Secondary | ICD-10-CM | POA: Diagnosis not present

## 2023-11-18 DIAGNOSIS — R6520 Severe sepsis without septic shock: Secondary | ICD-10-CM | POA: Diagnosis present

## 2023-11-18 DIAGNOSIS — F329 Major depressive disorder, single episode, unspecified: Secondary | ICD-10-CM | POA: Diagnosis present

## 2023-11-18 DIAGNOSIS — Z48813 Encounter for surgical aftercare following surgery on the respiratory system: Secondary | ICD-10-CM | POA: Diagnosis not present

## 2023-11-18 DIAGNOSIS — Z743 Need for continuous supervision: Secondary | ICD-10-CM | POA: Diagnosis not present

## 2023-11-18 DIAGNOSIS — R8271 Bacteriuria: Secondary | ICD-10-CM | POA: Diagnosis not present

## 2023-11-18 DIAGNOSIS — E039 Hypothyroidism, unspecified: Secondary | ICD-10-CM | POA: Diagnosis not present

## 2023-11-18 DIAGNOSIS — R0902 Hypoxemia: Secondary | ICD-10-CM | POA: Diagnosis not present

## 2023-11-18 HISTORY — DX: Coronavirus infection, unspecified: B34.2

## 2023-11-18 LAB — CBC
HCT: 42.2 % (ref 39.0–52.0)
HCT: 52.2 % — ABNORMAL HIGH (ref 39.0–52.0)
Hemoglobin: 13.2 g/dL (ref 13.0–17.0)
Hemoglobin: 15.4 g/dL (ref 13.0–17.0)
MCH: 27.3 pg (ref 26.0–34.0)
MCH: 27.6 pg (ref 26.0–34.0)
MCHC: 29.5 g/dL — ABNORMAL LOW (ref 30.0–36.0)
MCHC: 31.3 g/dL (ref 30.0–36.0)
MCV: 88.3 fL (ref 80.0–100.0)
MCV: 92.6 fL (ref 80.0–100.0)
Platelets: 331 10*3/uL (ref 150–400)
Platelets: 356 10*3/uL (ref 150–400)
RBC: 4.78 MIL/uL (ref 4.22–5.81)
RBC: 5.64 MIL/uL (ref 4.22–5.81)
RDW: 18.8 % — ABNORMAL HIGH (ref 11.5–15.5)
RDW: 19.9 % — ABNORMAL HIGH (ref 11.5–15.5)
WBC: 23.7 10*3/uL — ABNORMAL HIGH (ref 4.0–10.5)
WBC: 25.1 10*3/uL — ABNORMAL HIGH (ref 4.0–10.5)
nRBC: 0 % (ref 0.0–0.2)
nRBC: 0.1 % (ref 0.0–0.2)

## 2023-11-18 LAB — BASIC METABOLIC PANEL
Anion gap: 13 (ref 5–15)
Anion gap: 15 (ref 5–15)
BUN: 46 mg/dL — ABNORMAL HIGH (ref 8–23)
BUN: 47 mg/dL — ABNORMAL HIGH (ref 8–23)
CO2: 21 mmol/L — ABNORMAL LOW (ref 22–32)
CO2: 23 mmol/L (ref 22–32)
Calcium: 8.2 mg/dL — ABNORMAL LOW (ref 8.9–10.3)
Calcium: 8.2 mg/dL — ABNORMAL LOW (ref 8.9–10.3)
Chloride: 100 mmol/L (ref 98–111)
Chloride: 98 mmol/L (ref 98–111)
Creatinine, Ser: 1.38 mg/dL — ABNORMAL HIGH (ref 0.61–1.24)
Creatinine, Ser: 1.56 mg/dL — ABNORMAL HIGH (ref 0.61–1.24)
GFR, Estimated: 43 mL/min — ABNORMAL LOW (ref >60)
GFR, Estimated: 49 mL/min — ABNORMAL LOW (ref >60)
Glucose, Bld: 108 mg/dL — ABNORMAL HIGH (ref 70–99)
Glucose, Bld: 189 mg/dL — ABNORMAL HIGH (ref 70–99)
Potassium: 4.5 mmol/L (ref 3.5–5.1)
Potassium: 5 mmol/L (ref 3.5–5.1)
Sodium: 134 mmol/L — ABNORMAL LOW (ref 135–145)
Sodium: 136 mmol/L (ref 135–145)

## 2023-11-18 LAB — COMPREHENSIVE METABOLIC PANEL
ALT: 45 U/L — ABNORMAL HIGH (ref 0–44)
AST: 76 U/L — ABNORMAL HIGH (ref 15–41)
Albumin: 2.1 g/dL — ABNORMAL LOW (ref 3.5–5.0)
Alkaline Phosphatase: 147 U/L — ABNORMAL HIGH (ref 38–126)
Anion gap: 12 (ref 5–15)
BUN: 54 mg/dL — ABNORMAL HIGH (ref 8–23)
CO2: 27 mmol/L (ref 22–32)
Calcium: 8.2 mg/dL — ABNORMAL LOW (ref 8.9–10.3)
Chloride: 98 mmol/L (ref 98–111)
Creatinine, Ser: 1.57 mg/dL — ABNORMAL HIGH (ref 0.61–1.24)
GFR, Estimated: 42 mL/min — ABNORMAL LOW (ref >60)
Glucose, Bld: 167 mg/dL — ABNORMAL HIGH (ref 70–99)
Potassium: 3.8 mmol/L (ref 3.5–5.1)
Sodium: 137 mmol/L (ref 135–145)
Total Bilirubin: 0.8 mg/dL (ref 0.0–1.2)
Total Protein: 6.7 g/dL (ref 6.5–8.1)

## 2023-11-18 LAB — ECHOCARDIOGRAM LIMITED
AR max vel: 2.4 cm2
AV Area VTI: 2.69 cm2
AV Area mean vel: 2.23 cm2
AV Mean grad: 5 mmHg
AV Peak grad: 8.2 mmHg
Ao pk vel: 1.43 m/s
Calc EF: 56.2 %
Height: 66.5 in
S' Lateral: 3.3 cm
Single Plane A2C EF: 68.3 %
Single Plane A4C EF: 40.4 %
Weight: 3936.53 [oz_av]

## 2023-11-18 LAB — RESPIRATORY PANEL BY PCR

## 2023-11-18 LAB — RESP PANEL BY RT-PCR (RSV, FLU A&B, COVID)  RVPGX2
Influenza A by PCR: NEGATIVE
Influenza B by PCR: NEGATIVE
Resp Syncytial Virus by PCR: NEGATIVE
SARS Coronavirus 2 by RT PCR: NEGATIVE

## 2023-11-18 LAB — URINALYSIS, ROUTINE W REFLEX MICROSCOPIC
Bilirubin Urine: NEGATIVE
Glucose, UA: NEGATIVE mg/dL
Hgb urine dipstick: NEGATIVE
Ketones, ur: NEGATIVE mg/dL
Leukocytes,Ua: NEGATIVE
Nitrite: POSITIVE — AB
Protein, ur: NEGATIVE mg/dL
Specific Gravity, Urine: 1.009 (ref 1.005–1.030)
pH: 5 (ref 5.0–8.0)

## 2023-11-18 LAB — LACTIC ACID, PLASMA
Lactic Acid, Venous: 2.6 mmol/L (ref 0.5–1.9)
Lactic Acid, Venous: 2.8 mmol/L (ref 0.5–1.9)
Lactic Acid, Venous: 4.8 mmol/L (ref 0.5–1.9)

## 2023-11-18 LAB — MAGNESIUM
Magnesium: 2.4 mg/dL (ref 1.7–2.4)
Magnesium: 2.7 mg/dL — ABNORMAL HIGH (ref 1.7–2.4)

## 2023-11-18 LAB — BLOOD GAS, ARTERIAL
Acid-Base Excess: 3.4 mmol/L — ABNORMAL HIGH (ref 0.0–2.0)
Bicarbonate: 26.8 mmol/L (ref 20.0–28.0)
Drawn by: 20012
Expiratory PAP: 8 cmH2O
FIO2: 60 %
Inspiratory PAP: 14 cmH2O
Mode: POSITIVE
O2 Saturation: 97.8 %
Patient temperature: 36.3
RATE: 12 {breaths}/min
pCO2 arterial: 35 mmHg (ref 32–48)
pH, Arterial: 7.49 — ABNORMAL HIGH (ref 7.35–7.45)
pO2, Arterial: 72 mmHg — ABNORMAL LOW (ref 83–108)

## 2023-11-18 LAB — EXPECTORATED SPUTUM ASSESSMENT W GRAM STAIN, RFLX TO RESP C: Special Requests: NORMAL

## 2023-11-18 LAB — TSH: TSH: 0.169 u[IU]/mL — ABNORMAL LOW (ref 0.350–4.500)

## 2023-11-18 LAB — BRAIN NATRIURETIC PEPTIDE: B Natriuretic Peptide: 752.7 pg/mL — ABNORMAL HIGH (ref 0.0–100.0)

## 2023-11-18 LAB — PROCALCITONIN: Procalcitonin: 0.23 ng/mL

## 2023-11-18 LAB — GLUCOSE, CAPILLARY
Glucose-Capillary: 151 mg/dL — ABNORMAL HIGH (ref 70–99)
Glucose-Capillary: 161 mg/dL — ABNORMAL HIGH (ref 70–99)

## 2023-11-18 LAB — STREP PNEUMONIAE URINARY ANTIGEN: Strep Pneumo Urinary Antigen: NEGATIVE

## 2023-11-18 MED ORDER — CHLORHEXIDINE GLUCONATE CLOTH 2 % EX PADS
6.0000 | MEDICATED_PAD | Freq: Every day | CUTANEOUS | Status: DC
  Administered 2023-11-18: 6 via TOPICAL

## 2023-11-18 MED ORDER — INSULIN ASPART 100 UNIT/ML IJ SOLN
0.0000 [IU] | Freq: Three times a day (TID) | INTRAMUSCULAR | Status: DC
  Administered 2023-11-18: 3 [IU] via SUBCUTANEOUS
  Administered 2023-11-19: 2 [IU] via SUBCUTANEOUS
  Administered 2023-11-19: 3 [IU] via SUBCUTANEOUS
  Administered 2023-11-20 (×3): 2 [IU] via SUBCUTANEOUS
  Administered 2023-11-21: 3 [IU] via SUBCUTANEOUS
  Administered 2023-11-21 – 2023-11-24 (×7): 2 [IU] via SUBCUTANEOUS
  Administered 2023-11-26: 3 [IU] via SUBCUTANEOUS
  Administered 2023-11-27: 2 [IU] via SUBCUTANEOUS

## 2023-11-18 MED ORDER — FUROSEMIDE 10 MG/ML IJ SOLN
40.0000 mg | Freq: Once | INTRAMUSCULAR | Status: AC
  Administered 2023-11-18: 40 mg via INTRAVENOUS
  Filled 2023-11-18: qty 4

## 2023-11-18 MED ORDER — PERFLUTREN LIPID MICROSPHERE
1.0000 mL | INTRAVENOUS | Status: AC | PRN
  Administered 2023-11-18: 3 mL via INTRAVENOUS

## 2023-11-18 MED ORDER — HEPARIN SODIUM (PORCINE) 5000 UNIT/ML IJ SOLN: 5000.0000 [IU] | Freq: Three times a day (TID) | INTRAMUSCULAR | Status: DC

## 2023-11-18 MED ORDER — METHYLPREDNISOLONE SODIUM SUCC 125 MG IJ SOLR
125.0000 mg | Freq: Every day | INTRAMUSCULAR | Status: DC
  Administered 2023-11-18 – 2023-11-21 (×4): 125 mg via INTRAVENOUS
  Filled 2023-11-18 (×4): qty 2

## 2023-11-18 MED ORDER — SODIUM CHLORIDE 0.9 % IV SOLN
1.0000 g | INTRAVENOUS | Status: DC
  Administered 2023-11-18: 1 g via INTRAVENOUS
  Filled 2023-11-18: qty 10

## 2023-11-18 MED ORDER — DOXYCYCLINE HYCLATE 100 MG PO TABS
100.0000 mg | ORAL_TABLET | Freq: Two times a day (BID) | ORAL | Status: AC
  Administered 2023-11-18 – 2023-11-22 (×10): 100 mg via ORAL
  Filled 2023-11-18 (×10): qty 1

## 2023-11-18 MED ORDER — INSULIN ASPART 100 UNIT/ML IJ SOLN
0.0000 [IU] | Freq: Every day | INTRAMUSCULAR | Status: DC
  Administered 2023-11-19: 2 [IU] via SUBCUTANEOUS
  Administered 2023-11-22: 4 [IU] via SUBCUTANEOUS
  Administered 2023-11-25: 5 [IU] via SUBCUTANEOUS

## 2023-11-18 MED ORDER — SODIUM CHLORIDE 0.9 % IV SOLN
2.0000 g | INTRAVENOUS | Status: DC
  Administered 2023-11-18 – 2023-11-22 (×5): 2 g via INTRAVENOUS
  Filled 2023-11-18 (×5): qty 20

## 2023-11-18 NOTE — Progress Notes (Signed)
-    urine culture showed no growth, no UTI

## 2023-11-18 NOTE — Progress Notes (Signed)
 NAME:  Justin [name], MRN:  [id number], DOB:  13-Jul-1934, LOS: 0 ADMISSION DATE:  11/17/2023, CONSULTATION DATE:  11/18/23 REFERRING MD:  EDP, CHIEF COMPLAINT:  hypotension   History of Present Illness:  89 yo male presented to [facility]  early today after reporting increasing fatigue and hypoxia at home. Pt has been dealing with this for a few days, he states today his shortness of breath increased. He had previously been seen by his PCP for symptoms and was diagnosed with bronchitis. He was started on amoxicillin and without improvement changed to doxy and prednisone.   Upon presentation pt was found to be hypoxic in the 70's. Cxr c/w multilobar pna with possible superimposed edema. Wbc elevated as well as BNP.   Pt denies cp, dizziness, no fever chills at home, worsening exertional dyspnea.   At this time pt states he is comfortable but would prefer nasal cannula over NIV. No other complaints  Pertinent  Medical History  Cad s/p cabg H/o chf combined systolic/diastolic Htn Hyperlipidemia hypothyroidism Ao stenosis s/p TAVR Pafib bph  Significant Hospital Events: Including procedures, antibiotic start and stop dates in addition to other pertinent events   Admitted to [facility] 3/24 Transferred to ICU 3/25 2/2 increasing oxygen requirement and marginal pressure  Interim History / Subjective:  No pressor requirement, on Bipap  14/8, 60% this AM, alert and oriented RVP + for coronovirus, not covid   Objective   Blood pressure 119/69, pulse 93, temperature (!) 96.6 F (35.9 C), temperature source Axillary, resp. rate (!) 25, height 5' 6.5" (1.689 m), weight 111.6 kg, SpO2 (!) 85%.    FiO2 (%):  [50 %-60 %] 60 % Pressure Support:  [6 cmH20] 6 cmH20   Intake/Output Summary (Last 24 hours) at 11/18/2023 1037 Last data filed at 11/18/2023 0900 Gross per 24 hour  Intake 100 ml  Output 950 ml  Net -850 ml   Filed Weights   11/17/23 2244  Weight: 111.6 kg   General:  well nourished  elderly M resting on bipap without acute complaints  HEENT: MM pink/moist, bipap mask in place Neuro: alert, moving all extremities to command and oriented to person and place  CV: s1s2 rrr, no m/r/g PULM:  diminished air entry bilaterally with scattered rhonchi  GI: soft, bsx4 active  Extremities: warm/dry, trace pre-tibial  edema  Skin: no rashes or lesions  Labs: Lactic 4.8>2.8 WBC 25.1 Creat 1.38 (near baseline) UA +bacteria/nitrites  Lines/tubes/drains: Condom cath   I/O: UOP 550 yesterday Net -450   Micro: Legionella/strep pneumo pending  Covid/flu - RVP + coronavirus OC43 BC/UC pending   Resolved Hospital Problem list     Assessment & Plan:    Acute hypoxic resp failure Viral Pneumonia Sepsis with marginal pressures (not req pressors) Possible UTI RVP + for Coronavirus  -continue Bipap, trial HFNC today, if stable off bipap and is not hypotensive then likely stable for transfer off ICU service  -check blood and urine cultures -repeat lactic down-trended to 2.8, trend to <2 -continue Ceftriaxone and Doxycycline  -IS -CT chest pending  -continue pulmicort, brovana and xopanex -currently not requiring pressors   Acute on chronic decompensated systolic/diastolic hf Hypotension, not req pressors Atrial Fibrillation  -BNP 752 and trop negative -given Hx of HFrEF, last echo EF 40-45%, will repeat echo to eval for component of cardiogenic shock -may need further diuresed, lasix 80mg  given yesterday -hold bb, arb, imdur for now -continue po amio, Asa, statin,   Hypothyroidism -continue Synthroid  Best Practice (right click and "Reselect all SmartList Selections" daily)   Diet/type: NPO w/ oral meds DVT prophylaxis prophylactic heparin  Pressure ulcer(s): N/A GI prophylaxis: PPI Lines: N/A Foley:  N/A Code Status:  limited Last date of multidisciplinary goals of care discussion [per primary]  Labs   CBC: Recent Labs  Lab 11/17/23 1211  11/17/23 2333 11/18/23 0949  WBC 29.7* 23.7* 25.1*  NEUTROABS 24.2*  --   --   HGB 12.8* 13.2 15.4  HCT 41.4 42.2 52.2*  MCV 87.7 88.3 92.6  PLT 347 356 331    Basic Metabolic Panel: Recent Labs  Lab 11/17/23 1211 11/17/23 2333 11/18/23 0642  NA 136 134* 136  K 4.3 4.5 5.0  CL 99 98 100  CO2 24 23 21*  GLUCOSE 127* 189* 108*  BUN 41* 47* 46*  CREATININE 1.42* 1.56* 1.38*  CALCIUM 8.3* 8.2* 8.2*  MG  --  2.4  --    GFR: Estimated Creatinine Clearance: 44.6 mL/min (A) (by C-G formula based on SCr of 1.38 mg/dL (H)). Recent Labs  Lab 11/17/23 1211 11/17/23 2055 11/17/23 2333 11/18/23 0949  PROCALCITON  --   --  0.23  --   WBC 29.7*  --  23.7* 25.1*  LATICACIDVEN  --  4.8* 4.8*  --     Liver Function Tests: Recent Labs  Lab 11/17/23 1211  AST 53*  ALT 38  ALKPHOS 135*  BILITOT 1.7*  PROT 6.9  ALBUMIN 2.2*   No results for input(s): "LIPASE", "AMYLASE" in the last 168 hours. No results for input(s): "AMMONIA" in the last 168 hours.  ABG    Component Value Date/Time   PHART 7.48 (H) 11/17/2023 1832   PCO2ART 32 11/17/2023 1832   PO2ART 74 (L) 11/17/2023 1832   HCO3 23.8 11/17/2023 1832   TCO2 25 11/08/2021 0858   O2SAT 97 11/17/2023 1832     Coagulation Profile: No results for input(s): "INR", "PROTIME" in the last 168 hours.  Cardiac Enzymes: No results for input(s): "CKTOTAL", "CKMB", "CKMBINDEX", "TROPONINI" in the last 168 hours.  HbA1C: Hgb A1c MFr Bld  Date/Time Value Ref Range Status  10/14/2023 07:29 AM 6.8 (H) <5.7 % of total Hgb Final    Comment:    For someone without known diabetes, a hemoglobin A1c value of 6.5% or greater indicates that they may have  diabetes and this should be confirmed with a follow-up  test. . For someone with known diabetes, a value <7% indicates  that their diabetes is well controlled and a value  greater than or equal to 7% indicates suboptimal  control. A1c targets should be individualized based on   duration of diabetes, age, comorbid conditions, and  other considerations. . Currently, no consensus exists regarding use of hemoglobin A1c for diagnosis of diabetes for children. [name]   10/10/2022 11:43 AM 6.4 4.6 - 6.5 % Final    Comment:    Glycemic Control Guidelines for People with Diabetes:Non Diabetic:  <6%Goal of Therapy: <7%Additional Action Suggested:  >8%     CBG: No results for input(s): "GLUCAP" in the last 168 hours.  Review of Systems:   As per HPI  Past Medical History:  He,  has a past medical history of Age-related macular degeneration, dry, both eyes, Allergic, Anal fissure, Asthma, Benign prostatic hypertrophy, CAD (coronary artery disease), Carotid bruit, Chronic combined systolic and diastolic CHF (congestive heart failure) (HCC), Complication of anesthesia (1980s), Congestive heart failure (CHF) (HCC), ED (erectile dysfunction), Family history of adverse  reaction to anesthesia, GERD (gastroesophageal reflux disease), Gout, HTN (hypertension), colonic polyps, Hyperlipidemia, Hypothyroidism, Moderate to severe aortic stenosis, Myocardial infarction (HCC) (~ 2000), Obesity, Osteoarthritis, PAF (paroxysmal atrial fibrillation) (HCC), Precancerous skin lesion, Prostate cancer (HCC) (dx'd ~ 2014), 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   Surgical History:   Past Surgical History:  Procedure Laterality Date   ANUS SURGERY     "opened it back up cause it wouldn't heal; wound up w/a fissure" (01/07/2017)   BIV ICD INSERTION CRT-D N/A 05/02/2017   Procedure: BIV ICD INSERTION CRT-D;  Surgeon: Duke Salvia, MD;  Location: [facility] INVASIVE CV LAB;  Service: Cardiovascular;  Laterality: N/A;   CARDIAC CATHETERIZATION  10/29/2005   CARDIAC CATHETERIZATION N/A 08/27/2016   Procedure: Right/Left Heart Cath and Coronary/Graft Angiography;  Surgeon: Kathleene Hazel, MD;  Location: [facility] INVASIVE CV LAB;  Service: Cardiovascular;   Laterality: N/A;   CARDIOVERSION N/A 04/24/2021   Procedure: CARDIOVERSION;  Surgeon: Pricilla Riffle, MD;  Location: [facility] ENDOSCOPY;  Service: Cardiovascular;  Laterality: N/A;   CARDIOVERSION N/A 11/08/2021   Procedure: CARDIOVERSION;  Surgeon: Chrystie Nose, MD;  Location: [facility] ENDOSCOPY;  Service: Cardiovascular;  Laterality: N/A;   CARDIOVERSION N/A 02/06/2022   Procedure: CARDIOVERSION;  Surgeon: Wendall Stade, MD;  Location: [facility] ENDOSCOPY;  Service: Cardiovascular;  Laterality: N/A;   CATARACT EXTRACTION W/ INTRAOCULAR LENS  IMPLANT, BILATERAL Bilateral    CATARACT EXTRACTION, BILATERAL  2012   COLONOSCOPY  06/30/2008   no repeats needed    COLONOSCOPY     had 3 or 4 in the past    CORONARY ARTERY BYPASS GRAFT  2007   "CABG X4"   CYST EXCISION PERINEAL  1980s   HAMMER TOE SURGERY Bilateral    ICD GENERATOR CHANGEOUT N/A 05/30/2023   Procedure: ICD GENERATOR CHANGEOUT;  Surgeon: Duke Salvia, MD;  Location: [facility] INVASIVE CV LAB;  Service: Cardiovascular;  Laterality: N/A;   JOINT REPLACEMENT     KNEE ARTHROPLASTY  07/30/2011   Procedure: COMPUTER ASSISTED TOTAL KNEE ARTHROPLASTY;  Surgeon: Cammy Copa;  Location: [facility] OR;  Service: Orthopedics;  Laterality: Left;  left total knee arthroplasty   MASTECTOMY SUBCUTANEOUS Bilateral    MULTIPLE TOOTH EXTRACTIONS     ORIF FINGER / THUMB FRACTURE Right ~ 1980   "repair of thumb injury"   PROSTATE BIOPSY     REPLACEMENT TOTAL KNEE BILATERAL Bilateral 2012   TEE WITHOUT CARDIOVERSION N/A 03/04/2017   Procedure: TRANSESOPHAGEAL ECHOCARDIOGRAM (TEE);  Surgeon: Kathleene Hazel, MD;  Location: [facility] OR;  Service: Open Heart Surgery;  Laterality: N/A;   TOTAL KNEE REVISION Right 05/18/2019   Procedure: RIGHT PATELLA REVISION/REMOVAL;  Surgeon: Cammy Copa, MD;  Location: [facility] OR;  Service: Orthopedics;  Laterality: Right;   TRANSCATHETER AORTIC VALVE REPLACEMENT, TRANSFEMORAL N/A 03/04/2017   Procedure: TRANSCATHETER AORTIC VALVE  REPLACEMENT, TRANSFEMORAL;  Surgeon: Kathleene Hazel, MD;  Location: [facility] OR;  Service: Open Heart Surgery;  Laterality: N/A;     Social History:   reports that he quit smoking about 62 years ago. His smoking use included cigarettes. He started smoking about 75 years ago. He has a 45.5 pack-year smoking history. He has never used smokeless tobacco. He reports that he does not currently use alcohol after a past usage of about 7.0 standard drinks of alcohol per week. He reports that he does not use drugs.   Family History:  His family history includes Allergic rhinitis in  his father; Asthma in his father; Breast cancer in his mother; Heart attack (age of onset: 58) in his father; Heart failure (age of onset: 71) in his mother; Uterine cancer in his mother. There is no history of Colon cancer or Esophageal cancer.   Allergies Allergies  Allergen Reactions   Peanut-Containing Drug Products Anaphylaxis   Sulfonamide Derivatives Anaphylaxis   Amlodipine Swelling and Other (See Comments)    Swelling in ankles   Eliquis [Apixaban] Other (See Comments)    Back/hip pain   Lisinopril Cough   Xarelto [Rivaroxaban] Other (See Comments)    Back/hip pain     Home Medications  Prior to Admission medications   Medication Sig Start Date End Date Taking? Authorizing Provider  albuterol (VENTOLIN HFA) 108 (90 Base) MCG/ACT inhaler Inhale 2 puffs into the lungs every 4 (four) hours as needed for wheezing or shortness of breath. 05/06/23  Yes Nelwyn Salisbury, MD  allopurinol (ZYLOPRIM) 100 MG tablet Take 1 tablet (100 mg total) by mouth daily. 09/18/23  Yes Nelwyn Salisbury, MD  amiodarone (PACERONE) 200 MG tablet Take 2 tablets by mouth twice daily x 2 weeks then decrease to 2 tablets by mouth daily x 2 weeks 11/13/22  Yes Duke Salvia, MD  aspirin 81 MG tablet Take 1 tablet (81 mg total) by mouth daily. 05/19/19  Yes Magnant, Charles L, PA-C  atorvastatin (LIPITOR) 20 MG tablet TAKE ONE TABLET ONCE DAILY  10/29/23  Yes Kathleene Hazel, MD  cetirizine (ZYRTEC) 10 MG tablet Take 10 mg by mouth 2 (two) times daily.   Yes [provider]  colchicine 0.6 MG tablet Take 1 tablet (0.6 mg total) by mouth every 6 (six) hours as needed (gout). 09/18/23 09/17/24 Yes Nelwyn Salisbury, MD  Continuous Glucose Receiver (DEXCOM G6 RECEIVER) DEVI 1 Device by Does not apply route 3 (three) times daily. E11.69 10/23/23  Yes Mast, Man X, NP  Continuous Glucose Sensor (DEXCOM G6 SENSOR) MISC 1 Device by Does not apply route 3 (three) times daily. E11.69 10/23/23  Yes Mast, Man X, NP  Continuous Glucose Transmitter (DEXCOM G6 TRANSMITTER) MISC 1 Device by Does not apply route 3 (three) times daily. E11.69 10/23/23  Yes Mast, Man X, NP  dabigatran (PRADAXA) 150 MG CAPS capsule TAKE (1) CAPSULE TWICE DAILY. 10/24/23  Yes Kathleene Hazel, MD  dapagliflozin propanediol (FARXIGA) 10 MG TABS tablet Take 10 mg by mouth daily before breakfast.   Yes [provider]  dextromethorphan-guaiFENesin (MUCINEX DM) 30-600 MG 12hr tablet Take 1 tablet by mouth 2 (two) times daily for 14 days. 11/13/23 11/27/23 Yes Medina-Vargas, Monina C, NP  doxycycline (VIBRA-TABS) 100 MG tablet Take 1 tablet (100 mg total) by mouth 2 (two) times daily for 7 days. 11/13/23 11/20/23 Yes Medina-Vargas, Monina C, NP  isosorbide mononitrate (IMDUR) 30 MG 24 hr tablet Take 1 tablet (30 mg total) by mouth daily. 09/02/23 12/01/23 Yes Kathleene Hazel, MD  levothyroxine (SYNTHROID) 200 MCG tablet Take 1 tablet (200 mcg total) by mouth daily. 12/25/22  Yes Nelwyn Salisbury, MD  losartan (COZAAR) 25 MG tablet Take 0.5 tablets (12.5 mg total) by mouth daily. 07/06/23 07/05/24 Yes Dorcas Carrow, MD  magic mouthwash SOLN Take 5 mLs by mouth as directed. Suspension contains equal amounts of Maalox Extra Strength, nystatin, and diphenhydramine. RX'ed by dentist   Yes [provider]  methocarbamol (ROBAXIN) 500 MG tablet Take 1 tablet (500 mg  total) by mouth 4 (four) times daily as  needed for muscle spasms. 07/07/23  Yes London Sheer, MD  metoCLOPramide (REGLAN) 10 MG tablet TAKE ONE TABLET BY MOUTH EVERY MORNING WITH BREAKFAST 08/29/23  Yes Nelwyn Salisbury, MD  metolazone (ZAROXOLYN) 2.5 MG tablet Take 1 tablet (2.5 mg total) by mouth once a week every Saturday 10/27/23  Yes Duke Salvia, MD  metoprolol succinate (TOPROL-XL) 50 MG 24 hr tablet Take 2 tablets (100 mg total) by mouth daily. Take with or immediately following a meal. 05/30/23  Yes Duke Salvia, MD  montelukast (SINGULAIR) 10 MG tablet TAKE ONE TABLET BY MOUTH ONCE DAILY IN THE EVENING 08/20/22  Yes Hunsucker, Lesia Sago, MD  Multiple Minerals-Vitamins (CAL MAG ZINC +D3 PO) Take 2 tablets by mouth in the morning. 500 mg / 7 mg / 25 mcg   Yes [provider]  Multiple Vitamins-Minerals (MULTI-VITAMIN GUMMIES) CHEW Chew 2 tablets by mouth See admin instructions. Chew 2 gummies by mouth in the morning   Yes [provider]  Multiple Vitamins-Minerals (PRESERVISION AREDS 2) CAPS Take 1 capsule by mouth in the morning and at bedtime.   Yes [provider]  omeprazole (PRILOSEC) 20 MG capsule TAKE ONE CAPSULE TWICE DAILY 08/01/23  Yes Nelwyn Salisbury, MD  pantoprazole (PROTONIX) 40 MG tablet Take 1 tablet (40 mg total) by mouth daily. 10/23/23  Yes Mast, Man X, NP  potassium chloride SA (KLOR-CON M) 20 MEQ tablet Take 1 tablet (20 mEq total) by mouth 2 (two) times daily. 02/18/23  Yes Graciella Freer, PA-C  predniSONE (DELTASONE) 20 MG tablet Take 1 tablet (20 mg total) by mouth daily with breakfast for 5 days. 11/13/23 11/18/23 Yes Medina-Vargas, Monina C, NP  silver sulfADIAZINE (SILVADENE) 1 % cream Apply 1 Application topically 2 (two) times daily. 05/14/23  Yes Nelwyn Salisbury, MD  spironolactone (ALDACTONE) 25 MG tablet Take 1 tablet (25 mg total) by mouth daily. 10/24/23  Yes Kathleene Hazel, MD  torsemide (DEMADEX) 20 MG tablet Take 2  tablets (40 mg total) every other day, alternating with 1 tablet (20 mg total) every other day. 02/18/23  Yes Graciella Freer, PA-C  triamcinolone cream (KENALOG) 0.1 % Apply 1 application  topically daily as needed for dry skin (affected areas). 04/15/17  Yes [provider]  valACYclovir (VALTREX) 500 MG tablet Take 500 mg by mouth 2 (two) times daily as needed (cold sores).   Yes [provider]  venlafaxine XR (EFFEXOR XR) 37.5 MG 24 hr capsule Take 1 capsule (37.5 mg total) by mouth daily with breakfast. 10/23/23  Yes Mast, Man X, NP  benzonatate (TESSALON) 200 MG capsule Take 1 capsule (200 mg total) by mouth 2 (two) times daily as needed for cough. 05/06/23   Nelwyn Salisbury, MD  ketotifen (ZADITOR) 0.025 % ophthalmic solution Place 1 drop into both eyes daily as needed (for itching).    [provider]     Critical care time:  additional 35 mins       CRITICAL CARE Performed by: Darcella Gasman Oliveah Zwack   Total critical care time: additional 35 minutes  Critical care time was exclusive of separately billable procedures and treating other patients.  Critical care was necessary to treat or prevent imminent or life-threatening deterioration.  Critical care was time spent personally by me on the following activities: development of treatment plan with patient and/or surrogate as well as nursing, discussions with consultants, evaluation of patient's response to treatment, examination of patient, obtaining history from patient  or surrogate, ordering and performing treatments and interventions, ordering and review of laboratory studies, ordering and review of radiographic studies, pulse oximetry and re-evaluation of patient's condition.   Darcella Gasman Jc Veron, PA-C [facility] See Amion for pager If no response to pager , please call [phone] until 7pm After 7:00 pm call Elink  [phone]

## 2023-11-18 NOTE — Hospital Course (Addendum)
 89 yo male with PMH BPH, CAD s/p CABG, CHF, HTN, HLD, DMII, AS s/p TAVR, PAF, hypothyroidism who presented with shortness of breath who was recently seen by PCP and also given abx due to symptoms along with prednisone. Patient seen in the ED chest x-ray showed interstitial markings concerning for pneumonia/pulmonary edema troponin 14 BNP 880, due to increased work of breathing needed BiPAP and subsequently admitted for acute hypoxic aspiratory failure from coronavirus non-COVID infection along with background history of systolic CHF, pleural effusion. Seen by pulmonary and cardiology meds adjusted at this time-remains stable on 2 L [place] Remains weak and deconditioned plan for SNF today and auth approved for 11/27/23.  Consultation: Cardiology Pulmonary  Significant testing/procedure: CT chest without 3/26>Extensive airspace disease throughout the right lung compatible with pneumonia. Moderate sized left pleural effusion with compressive atelectasis in the left lower lobe.  Subjective: Seen and examined this morning On 2 L [place] overnight afebrile Labs reviewed WBC remains up 19-20 K creatinine trending up 1.1>1.3 No complaints.  He feels ready for discharge to SNF.  Discharge diagnoses:  Primary problem : acute hypoxic respiratory failure Coronavirus infection with  OC43 with pneumonia respiratory failure multifactorial- 2/2 coronavirus infection, pneumonia, pleural effusion, acute HFmrEF. Managed with multimodal approach with diuretics.Seen by cardiology GDMT adjusted. Seen by pulmonary, given steroids bronchodilators and antibiotics. S/p high-dose steroids x 5 days through 3/29> then on prednisone taper  40 mg daily x 5 days, 20 mg daily x 5 days, 10 mg for 5 days then stop.    Left pleural effusion: Thoracentesis w/ 1000 cc of bloody fluid removed exudative by LDH.Complete antibiotics.    Acute HFmREF: Cardiology managing, diuresed with IV Lasix off  3/30 w/ good negative net balance and  weight further down. Now on torsemide 20 mg dailuy cont same. GDMT with Toprol-XL, losartan.  No Jardiance due to history of UTI.  Device interrogated.  CAD with history of CABG times 11/2005 HTN HLD: No acute issues, PTA on aspirin, Lipitor continue the same.  BP fairly stable, on losartan, Toprol.  History of aortic stenosis status post TAVR 2018: Echo on 3/25 EF 40-45%.  Severe LAE RA, moderately reduced RV, abnormal wall motion in addition to RV pacing.  Normal function of aortic valve prosthesis.  Permanent A-fib LBBB/IVCD Frequent PVCs/NSVT: Rate controlled.on amiodarone 200 daily for nsvt suppression, on Pradaxa for anticoagulation.  Continue Toprol.Followed by EP cardiology outpatient..  Hypothyroidism: TSH 0.1 69 and  FT4 elevated 1.6-Synthroid dose decreased to 175 mcg needs repeat TSH and FT4 in 4 to 6 weeks to review adjust dose.  Asymptomatic bacteriuria: ESBL E. coli but UA with WBC 0-5 and asymptomatic.  No indication for treatment  CKD stage IIIa: Baseline 1.3-1.4, stable, monitor level fluctuating.  GERD: Continue PPI  Depression: Continue her home Effexor recently started  Type 2 diabetes mellitus:  A1c stable On sliding scale insulin here. Stable

## 2023-11-18 NOTE — Progress Notes (Addendum)
   11/18/23 2323  BiPAP/CPAP/SIPAP  BiPAP/CPAP/SIPAP Pt Type Adult  BiPAP/CPAP/SIPAP V60  Mask Type Full face mask  Dentures removed? Not applicable  Mask Size Large  Set Rate 12 breaths/min  Respiratory Rate 19 breaths/min  IPAP 14 cmH20  EPAP 8 cmH2O  FiO2 (%) (S)  50 % (spo2 99% on 60% fio2, weaned down to 50%, rn aware)  Minute Ventilation 12.2  Leak 9  Peak Inspiratory Pressure (PIP) 14  Tidal Volume (Vt) 605  Patient Home Machine No  Patient Home Mask No  Patient Home Tubing No  Auto Titrate No  Press High Alarm 25 cmH2O  Press Low Alarm 5 cmH2O  CPAP/SIPAP surface wiped down Yes  BiPAP/CPAP /SiPAP Vitals  Pulse Rate (!) 101  Resp 19  BP 96/62  Bilateral Breath Sounds Diminished   Some redness noted around the patient's nose, mepilex applied to the area.

## 2023-11-18 NOTE — Assessment & Plan Note (Addendum)
-   Initially presumed due to viral pna (see below); given slow improvement, other differentials have been considered including volume overload versus amiodarone toxicity versus viral lung injury - requiring HF; not on O2 at baseline at home; wean as able - steroids added 3/25; taper planned

## 2023-11-18 NOTE — Assessment & Plan Note (Signed)
-   Significant hypotension on admission borderline requiring pressors; antihypertensives on hold

## 2023-11-18 NOTE — Assessment & Plan Note (Signed)
 Continue Synthroid.  Check TSH.

## 2023-11-18 NOTE — Assessment & Plan Note (Signed)
-   A1c 6.8 % on 10/14/23 - continue on SSI and CBGs

## 2023-11-18 NOTE — Assessment & Plan Note (Addendum)
-   Continue Effexor; recently started about 3 to 4 weeks ago at friend's home; he will undergo follow-up upon discharge and dosage adjustment at that time -Discussed with patient and his daughters bedside, they are okay continuing on current dose

## 2023-11-18 NOTE — Assessment & Plan Note (Signed)
-   Continue Pradaxa and amiodarone

## 2023-11-18 NOTE — Progress Notes (Addendum)
   11/18/23 0009  BiPAP/CPAP/SIPAP  BiPAP/CPAP/SIPAP Pt Type Adult  BiPAP/CPAP/SIPAP V60  Mask Type Full face mask  Dentures removed? Not applicable  Mask Size Large  Set Rate 12 breaths/min  Respiratory Rate 18 breaths/min  IPAP 12 cmH20  EPAP 6 cmH2O  FiO2 (%) 50 %  Minute Ventilation 12.7  Leak 9  Peak Inspiratory Pressure (PIP) 12  Tidal Volume (Vt) 635  Patient Home Equipment No  Auto Titrate No  Press High Alarm 25 cmH2O  Press Low Alarm 5 cmH2O  CPAP/SIPAP surface wiped down Yes  BiPAP/CPAP /SiPAP Vitals  Pulse Rate 91  Resp 18  BP (!) 115/59  Bilateral Breath Sounds Clear   Redness noted on nose, mepilex applied to the area.

## 2023-11-18 NOTE — Assessment & Plan Note (Addendum)
-   patient has history of CKD3a. Baseline creat ~ 1.3 - 1.4, eGFR~ 50 - at baseline  -Monitor while on diuresis

## 2023-11-18 NOTE — Assessment & Plan Note (Signed)
-

## 2023-11-18 NOTE — Progress Notes (Signed)
 Patient's daughter at bedside with multiple questions (repeatedly asked how long patient would be in hospital), very anxious about her father at this time, reassured by this nurse about patient's current condition.

## 2023-11-18 NOTE — Progress Notes (Signed)
  Echocardiogram 2D Echocardiogram has been performed.  Justin [name] Justin [name] 11/18/2023, 11:16 AM

## 2023-11-18 NOTE — Consult Note (Signed)
 NAME:  Justin [name], MRN:  [id number], DOB:  05/30/1934, LOS: 0 ADMISSION DATE:  11/17/2023, CONSULTATION DATE:  11/18/23 REFERRING MD:  EDP, CHIEF COMPLAINT:  hypotension   History of Present Illness:  89 yo male presented to [facility]  early today after reporting increasing fatigue and hypoxia at home. Pt has been dealing with this for a few days, he states today his shortness of breath increased. He had previously been seen by his PCP for symptoms and was diagnosed with bronchitis. He was started on amoxicillin and without improvement changed to doxy and prednisone.   Upon presentation pt was found to be hypoxic in the 70's. Cxr c/w multilobar pna with possible superimposed edema. Wbc elevated as well as BNP.   Pt denies cp, dizziness, no fever chills at home, worsening exertional dyspnea.   At this time pt states he is comfortable but would prefer nasal cannula over NIV. No other complaints  Pertinent  Medical History  Cad s/p cabg H/o chf combined systolic/diastolic Htn Hyperlipidemia hypothyroidism Ao stenosis s/p TAVR Pafib bph  Significant Hospital Events: Including procedures, antibiotic start and stop dates in addition to other pertinent events   Admitted to [facility] 3/24 Transferred to ICU 3/25 2/2 increasing oxygen requirement and marginal pressure  Interim History / Subjective:    Objective   Blood pressure 105/61, pulse 89, temperature 98.7 F (37.1 C), temperature source Axillary, resp. rate (!) 29, height 5' 6.5" (1.689 m), weight 111.6 kg, SpO2 94%.    FiO2 (%):  [50 %] 50 %  No intake or output data in the 24 hours ending 11/18/23 0146 Filed Weights   11/17/23 2244  Weight: 111.6 kg    Examination: General: nad, resting comfortably in bed with NIV in place HENT: ncat, eomi, perrla,  Lungs: rhonchi bilaterally Cardiovascular: irreg Abdomen: soft, nt,nd BS+ Extremities: no c/c and only trace, non pitting edema Neuro: aaox3 without focal deficits GU:  deferred  Resolved Hospital Problem list     Assessment & Plan:  Acute hypoxic resp failure Multilobar pneumonia, gp/atypical or viral Sepsis with marginal pressures (not req pressors) Acute on chronic decompensated systolic/diastolic hf Hypotension, not req pressors hypothyroidism -hopefully can de-escalate oxygen requirement, consider trial of hhfnc -cont empiric abx -f/u cx, sputum sample if ever able to obtain -recheck echo -continue monitoring BP closely start pressors if map <65 -send rvp, strep/legionella ag -stop bb, arb, imdur -ct chest when more stable -start prophylactic heparin  Best Practice (right click and "Reselect all SmartList Selections" daily)   Diet/type: NPO w/ oral meds DVT prophylaxis prophylactic heparin  Pressure ulcer(s): N/A GI prophylaxis: PPI Lines: N/A Foley:  N/A Code Status:  limited Last date of multidisciplinary goals of care discussion [per primary]  Labs   CBC: Recent Labs  Lab 11/17/23 1211 11/17/23 2333  WBC 29.7* 23.7*  NEUTROABS 24.2*  --   HGB 12.8* 13.2  HCT 41.4 42.2  MCV 87.7 88.3  PLT 347 356    Basic Metabolic Panel: Recent Labs  Lab 11/17/23 1211 11/17/23 2333  NA 136 134*  K 4.3 4.5  CL 99 98  CO2 24 23  GLUCOSE 127* 189*  BUN 41* 47*  CREATININE 1.42* 1.56*  CALCIUM 8.3* 8.2*  MG  --  2.4   GFR: Estimated Creatinine Clearance: 39.4 mL/min (A) (by C-G formula based on SCr of 1.56 mg/dL (H)). Recent Labs  Lab 11/17/23 1211 11/17/23 2055 11/17/23 2333  WBC 29.7*  --  23.7*  LATICACIDVEN  --  4.8* 4.8*    Liver Function Tests: Recent Labs  Lab 11/17/23 1211  AST 53*  ALT 38  ALKPHOS 135*  BILITOT 1.7*  PROT 6.9  ALBUMIN 2.2*   No results for input(s): "LIPASE", "AMYLASE" in the last 168 hours. No results for input(s): "AMMONIA" in the last 168 hours.  ABG    Component Value Date/Time   PHART 7.48 (H) 11/17/2023 1832   PCO2ART 32 11/17/2023 1832   PO2ART 74 (L) 11/17/2023 1832    HCO3 23.8 11/17/2023 1832   TCO2 25 11/08/2021 0858   O2SAT 97 11/17/2023 1832     Coagulation Profile: No results for input(s): "INR", "PROTIME" in the last 168 hours.  Cardiac Enzymes: No results for input(s): "CKTOTAL", "CKMB", "CKMBINDEX", "TROPONINI" in the last 168 hours.  HbA1C: Hgb A1c MFr Bld  Date/Time Value Ref Range Status  10/14/2023 07:29 AM 6.8 (H) <5.7 % of total Hgb Final    Comment:    For someone without known diabetes, a hemoglobin A1c value of 6.5% or greater indicates that they may have  diabetes and this should be confirmed with a follow-up  test. . For someone with known diabetes, a value <7% indicates  that their diabetes is well controlled and a value  greater than or equal to 7% indicates suboptimal  control. A1c targets should be individualized based on  duration of diabetes, age, comorbid conditions, and  other considerations. . Currently, no consensus exists regarding use of hemoglobin A1c for diagnosis of diabetes for children. [name]   10/10/2022 11:43 AM 6.4 4.6 - 6.5 % Final    Comment:    Glycemic Control Guidelines for People with Diabetes:Non Diabetic:  <6%Goal of Therapy: <7%Additional Action Suggested:  >8%     CBG: No results for input(s): "GLUCAP" in the last 168 hours.  Review of Systems:   As per HPI  Past Medical History:  He,  has a past medical history of Age-related macular degeneration, dry, both eyes, Allergic, Anal fissure, Asthma, Benign prostatic hypertrophy, CAD (coronary artery disease), Carotid bruit, Chronic combined systolic and diastolic CHF (congestive heart failure) (HCC), Complication of anesthesia (1980s), Congestive heart failure (CHF) (HCC), ED (erectile dysfunction), Family history of adverse reaction to anesthesia, GERD (gastroesophageal reflux disease), Gout, HTN (hypertension), colonic polyps, Hyperlipidemia, Hypothyroidism, Moderate to severe aortic stenosis, Myocardial infarction (HCC) (~ 2000), Obesity,  Osteoarthritis, PAF (paroxysmal atrial fibrillation) (HCC), Precancerous skin lesion, Prostate cancer (HCC) (dx'd ~ 2014), 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   Surgical History:   Past Surgical History:  Procedure Laterality Date   ANUS SURGERY     "opened it back up cause it wouldn't heal; wound up w/a fissure" (01/07/2017)   BIV ICD INSERTION CRT-D N/A 05/02/2017   Procedure: BIV ICD INSERTION CRT-D;  Surgeon: Duke Salvia, MD;  Location: [facility] INVASIVE CV LAB;  Service: Cardiovascular;  Laterality: N/A;   CARDIAC CATHETERIZATION  10/29/2005   CARDIAC CATHETERIZATION N/A 08/27/2016   Procedure: Right/Left Heart Cath and Coronary/Graft Angiography;  Surgeon: Kathleene Hazel, MD;  Location: [facility] INVASIVE CV LAB;  Service: Cardiovascular;  Laterality: N/A;   CARDIOVERSION N/A 04/24/2021   Procedure: CARDIOVERSION;  Surgeon: Pricilla Riffle, MD;  Location: [facility] ENDOSCOPY;  Service: Cardiovascular;  Laterality: N/A;   CARDIOVERSION N/A 11/08/2021   Procedure: CARDIOVERSION;  Surgeon: Chrystie Nose, MD;  Location: [facility] ENDOSCOPY;  Service: Cardiovascular;  Laterality: N/A;   CARDIOVERSION N/A 02/06/2022   Procedure: CARDIOVERSION;  Surgeon:  Wendall Stade, MD;  Location: [facility] ENDOSCOPY;  Service: Cardiovascular;  Laterality: N/A;   CATARACT EXTRACTION W/ INTRAOCULAR LENS  IMPLANT, BILATERAL Bilateral    CATARACT EXTRACTION, BILATERAL  2012   COLONOSCOPY  06/30/2008   no repeats needed    COLONOSCOPY     had 3 or 4 in the past    CORONARY ARTERY BYPASS GRAFT  2007   "CABG X4"   CYST EXCISION PERINEAL  1980s   HAMMER TOE SURGERY Bilateral    ICD GENERATOR CHANGEOUT N/A 05/30/2023   Procedure: ICD GENERATOR CHANGEOUT;  Surgeon: Duke Salvia, MD;  Location: [facility] INVASIVE CV LAB;  Service: Cardiovascular;  Laterality: N/A;   JOINT REPLACEMENT     KNEE ARTHROPLASTY  07/30/2011   Procedure: COMPUTER ASSISTED TOTAL KNEE ARTHROPLASTY;  Surgeon:  Cammy Copa;  Location: [facility] OR;  Service: Orthopedics;  Laterality: Left;  left total knee arthroplasty   MASTECTOMY SUBCUTANEOUS Bilateral    MULTIPLE TOOTH EXTRACTIONS     ORIF FINGER / THUMB FRACTURE Right ~ 1980   "repair of thumb injury"   PROSTATE BIOPSY     REPLACEMENT TOTAL KNEE BILATERAL Bilateral 2012   TEE WITHOUT CARDIOVERSION N/A 03/04/2017   Procedure: TRANSESOPHAGEAL ECHOCARDIOGRAM (TEE);  Surgeon: Kathleene Hazel, MD;  Location: [facility] OR;  Service: Open Heart Surgery;  Laterality: N/A;   TOTAL KNEE REVISION Right 05/18/2019   Procedure: RIGHT PATELLA REVISION/REMOVAL;  Surgeon: Cammy Copa, MD;  Location: [facility] OR;  Service: Orthopedics;  Laterality: Right;   TRANSCATHETER AORTIC VALVE REPLACEMENT, TRANSFEMORAL N/A 03/04/2017   Procedure: TRANSCATHETER AORTIC VALVE REPLACEMENT, TRANSFEMORAL;  Surgeon: Kathleene Hazel, MD;  Location: [facility] OR;  Service: Open Heart Surgery;  Laterality: N/A;     Social History:   reports that he quit smoking about 62 years ago. His smoking use included cigarettes. He started smoking about 75 years ago. He has a 45.5 pack-year smoking history. He has never used smokeless tobacco. He reports that he does not currently use alcohol after a past usage of about 7.0 standard drinks of alcohol per week. He reports that he does not use drugs.   Family History:  His family history includes Allergic rhinitis in his father; Asthma in his father; Breast cancer in his mother; Heart attack (age of onset: 43) in his father; Heart failure (age of onset: 67) in his mother; Uterine cancer in his mother. There is no history of Colon cancer or Esophageal cancer.   Allergies Allergies  Allergen Reactions   Peanut-Containing Drug Products Anaphylaxis   Sulfonamide Derivatives Anaphylaxis   Amlodipine Swelling and Other (See Comments)    Swelling in ankles   Eliquis [Apixaban] Other (See Comments)    Back/hip pain   Lisinopril Cough   Xarelto  [Rivaroxaban] Other (See Comments)    Back/hip pain     Home Medications  Prior to Admission medications   Medication Sig Start Date End Date Taking? Authorizing Provider  albuterol (VENTOLIN HFA) 108 (90 Base) MCG/ACT inhaler Inhale 2 puffs into the lungs every 4 (four) hours as needed for wheezing or shortness of breath. 05/06/23  Yes Nelwyn Salisbury, MD  allopurinol (ZYLOPRIM) 100 MG tablet Take 1 tablet (100 mg total) by mouth daily. 09/18/23  Yes Nelwyn Salisbury, MD  amiodarone (PACERONE) 200 MG tablet Take 2 tablets by mouth twice daily x 2 weeks then decrease to 2 tablets by mouth daily x 2 weeks 11/13/22  Yes Duke Salvia, MD  aspirin 81 MG  tablet Take 1 tablet (81 mg total) by mouth daily. 05/19/19  Yes Magnant, Charles L, PA-C  atorvastatin (LIPITOR) 20 MG tablet TAKE ONE TABLET ONCE DAILY 10/29/23  Yes Kathleene Hazel, MD  cetirizine (ZYRTEC) 10 MG tablet Take 10 mg by mouth 2 (two) times daily.   Yes [provider]  colchicine 0.6 MG tablet Take 1 tablet (0.6 mg total) by mouth every 6 (six) hours as needed (gout). 09/18/23 09/17/24 Yes Nelwyn Salisbury, MD  Continuous Glucose Receiver (DEXCOM G6 RECEIVER) DEVI 1 Device by Does not apply route 3 (three) times daily. E11.69 10/23/23  Yes Mast, Man X, NP  Continuous Glucose Sensor (DEXCOM G6 SENSOR) MISC 1 Device by Does not apply route 3 (three) times daily. E11.69 10/23/23  Yes Mast, Man X, NP  Continuous Glucose Transmitter (DEXCOM G6 TRANSMITTER) MISC 1 Device by Does not apply route 3 (three) times daily. E11.69 10/23/23  Yes Mast, Man X, NP  dabigatran (PRADAXA) 150 MG CAPS capsule TAKE (1) CAPSULE TWICE DAILY. 10/24/23  Yes Kathleene Hazel, MD  dapagliflozin propanediol (FARXIGA) 10 MG TABS tablet Take 10 mg by mouth daily before breakfast.   Yes [provider]  dextromethorphan-guaiFENesin (MUCINEX DM) 30-600 MG 12hr tablet Take 1 tablet by mouth 2 (two) times daily for 14 days. 11/13/23 11/27/23 Yes  Medina-Vargas, Monina C, NP  doxycycline (VIBRA-TABS) 100 MG tablet Take 1 tablet (100 mg total) by mouth 2 (two) times daily for 7 days. 11/13/23 11/20/23 Yes Medina-Vargas, Monina C, NP  isosorbide mononitrate (IMDUR) 30 MG 24 hr tablet Take 1 tablet (30 mg total) by mouth daily. 09/02/23 12/01/23 Yes Kathleene Hazel, MD  levothyroxine (SYNTHROID) 200 MCG tablet Take 1 tablet (200 mcg total) by mouth daily. 12/25/22  Yes Nelwyn Salisbury, MD  losartan (COZAAR) 25 MG tablet Take 0.5 tablets (12.5 mg total) by mouth daily. 07/06/23 07/05/24 Yes Dorcas Carrow, MD  magic mouthwash SOLN Take 5 mLs by mouth as directed. Suspension contains equal amounts of Maalox Extra Strength, nystatin, and diphenhydramine. RX'ed by dentist   Yes [provider]  methocarbamol (ROBAXIN) 500 MG tablet Take 1 tablet (500 mg total) by mouth 4 (four) times daily as needed for muscle spasms. 07/07/23  Yes London Sheer, MD  metoCLOPramide (REGLAN) 10 MG tablet TAKE ONE TABLET BY MOUTH EVERY MORNING WITH BREAKFAST 08/29/23  Yes Nelwyn Salisbury, MD  metolazone (ZAROXOLYN) 2.5 MG tablet Take 1 tablet (2.5 mg total) by mouth once a week every Saturday 10/27/23  Yes Duke Salvia, MD  metoprolol succinate (TOPROL-XL) 50 MG 24 hr tablet Take 2 tablets (100 mg total) by mouth daily. Take with or immediately following a meal. 05/30/23  Yes Duke Salvia, MD  montelukast (SINGULAIR) 10 MG tablet TAKE ONE TABLET BY MOUTH ONCE DAILY IN THE EVENING 08/20/22  Yes Hunsucker, Lesia Sago, MD  Multiple Minerals-Vitamins (CAL MAG ZINC +D3 PO) Take 2 tablets by mouth in the morning. 500 mg / 7 mg / 25 mcg   Yes [provider]  Multiple Vitamins-Minerals (MULTI-VITAMIN GUMMIES) CHEW Chew 2 tablets by mouth See admin instructions. Chew 2 gummies by mouth in the morning   Yes [provider]  Multiple Vitamins-Minerals (PRESERVISION AREDS 2) CAPS Take 1 capsule by mouth in the morning and at bedtime.   Yes [provider]  omeprazole (PRILOSEC) 20 MG capsule TAKE ONE CAPSULE TWICE DAILY 08/01/23  Yes Nelwyn Salisbury, MD  pantoprazole (PROTONIX) 40 MG tablet  Take 1 tablet (40 mg total) by mouth daily. 10/23/23  Yes Mast, Man X, NP  potassium chloride SA (KLOR-CON M) 20 MEQ tablet Take 1 tablet (20 mEq total) by mouth 2 (two) times daily. 02/18/23  Yes Graciella Freer, PA-C  predniSONE (DELTASONE) 20 MG tablet Take 1 tablet (20 mg total) by mouth daily with breakfast for 5 days. 11/13/23 11/18/23 Yes Medina-Vargas, Monina C, NP  silver sulfADIAZINE (SILVADENE) 1 % cream Apply 1 Application topically 2 (two) times daily. 05/14/23  Yes Nelwyn Salisbury, MD  spironolactone (ALDACTONE) 25 MG tablet Take 1 tablet (25 mg total) by mouth daily. 10/24/23  Yes Kathleene Hazel, MD  torsemide (DEMADEX) 20 MG tablet Take 2 tablets (40 mg total) every other day, alternating with 1 tablet (20 mg total) every other day. 02/18/23  Yes Graciella Freer, PA-C  triamcinolone cream (KENALOG) 0.1 % Apply 1 application  topically daily as needed for dry skin (affected areas). 04/15/17  Yes [provider]  valACYclovir (VALTREX) 500 MG tablet Take 500 mg by mouth 2 (two) times daily as needed (cold sores).   Yes [provider]  venlafaxine XR (EFFEXOR XR) 37.5 MG 24 hr capsule Take 1 capsule (37.5 mg total) by mouth daily with breakfast. 10/23/23  Yes Mast, Man X, NP  benzonatate (TESSALON) 200 MG capsule Take 1 capsule (200 mg total) by mouth 2 (two) times daily as needed for cough. 05/06/23   Nelwyn Salisbury, MD  ketotifen (ZADITOR) 0.025 % ophthalmic solution Place 1 drop into both eyes daily as needed (for itching).    [provider]     Critical care time: 

## 2023-11-18 NOTE — Progress Notes (Addendum)
 Progress Note    Justin [name]   ZOX:[id number]  DOB: [birth year]  DOA: 11/17/2023     0 PCP: Mast, Man X, NP  Initial CC: SOB  Hospital Course: Mr. Gladden is an 89 yo male with PMH BPH, CAD s/p CABG, CHF, HTN, HLD, DMII, AS s/p TAVR, PAF, hypothyroidism who presented with shortness of breath.  He was recently seen by primary care and also given abx due to symptoms along with prednisone.  He required BIPAP on admission and became progressively more hypotensive also.  CXR showed asymmetric interstitial opacities concerning for atypical pneumonia versus pulmonary edema or both. RVP swab positive for coronavirus OC 43.  He also became more hypoxic requiring salter high flow. WBC elevated at 29.7 on admission and he was also started on antibiotics. Other notable labs included elevated BNP, 880 and he was given lasix as well.   Interval History:  Resting more comfortably in bed when seen this morning.  Still having ongoing cough.  Assessment and Plan: * Acute hypoxic respiratory failure (HCC) - due to viral pna (see below) - requiring salter HF; not on O2 at baseline at home; wean as able - steroids added 3/25  Coronavirus infection - RVP positive for coronavirus OC43 (non-covid) - CXR looks like diffuse GGO and he was very hypoxic and hypotensive on admission - continue nebs - add steroids given profound hypoxia  - continue droplet precautions   Type 2 diabetes mellitus (HCC) - A1c 6.8 % on 10/14/23 - continue on SSI and CBGs  MDD (major depressive disorder) - Continue Effexor  Chronic kidney disease, stage 3a (HCC) - patient has history of CKD3a. Baseline creat ~ 1.3 - 1.4, eGFR~ 50 - at baseline   Paroxysmal atrial fibrillation (HCC) - Continue Pradaxa and amiodarone  Gastroesophageal reflux disease - Continue Protonix  Hypothyroidism - Continue Synthroid -Check TSH  Essential hypertension - Significant hypotension on admission borderline requiring pressors;  antihypertensives on hold  Hyperlipidemia - Continue Lipitor   Old records reviewed in assessment of this patient  Antimicrobials: Rocephin 11/18/2023 >> current Doxycycline 11/18/2023 >> current  DVT prophylaxis:  SCDs Start: 11/17/23 1536 dabigatran (PRADAXA) capsule 150 mg   Code Status:   Code Status: Limited: Do not attempt resuscitation (DNR) -DNR-LIMITED -Do Not Intubate/DNI   Mobility Assessment (Last 72 Hours)     Mobility Assessment   No documentation.           Barriers to discharge: None Disposition Plan: Home HH orders placed:  Status is: Inpatient  Objective: Blood pressure (!) 121/58, pulse (!) 106, temperature (!) 97.4 F (36.3 C), temperature source Oral, resp. rate (!) 28, height 5' 6.5" (1.689 m), weight 111.6 kg, SpO2 (!) 88%.  Examination:  Physical Exam Constitutional:      General: He is not in acute distress.    Appearance: Normal appearance.  HENT:     Head: Normocephalic and atraumatic.     Mouth/Throat:     Mouth: Mucous membranes are moist.  Eyes:     Extraocular Movements: Extraocular movements intact.  Cardiovascular:     Rate and Rhythm: Normal rate and regular rhythm.  Pulmonary:     Effort: Pulmonary effort is normal. No respiratory distress.     Breath sounds: Wheezing and rhonchi present.  Abdominal:     General: Bowel sounds are normal. There is no distension.     Palpations: Abdomen is soft.     Tenderness: There is no abdominal tenderness.  Musculoskeletal:  General: Normal range of motion.     Cervical back: Normal range of motion and neck supple.  Skin:    General: Skin is warm and dry.  Neurological:     General: No focal deficit present.     Mental Status: He is alert.  Psychiatric:        Mood and Affect: Mood normal.        Behavior: Behavior normal.      Consultants:  Pulmonology  Procedures:    Data Reviewed: Results for orders placed or performed during the hospital encounter of 11/17/23  (from the past 24 hours)  Blood gas, arterial     Status: Abnormal   Collection Time: 11/17/23  6:32 PM  Result Value Ref Range   Delivery systems BILEVEL POSITIVE AIRWAY PRESSURE    RATE 12 resp/min   Inspiratory PAP 12 cmH2O   Expiratory PAP 6 cmH2O   pH, Arterial 7.48 (H) 7.35 - 7.45   pCO2 arterial 32 32 - 48 mmHg   pO2, Arterial 74 (L) 83 - 108 mmHg   Bicarbonate 23.8 20.0 - 28.0 mmol/L   Acid-Base Excess 0.9 0.0 - 2.0 mmol/L   O2 Saturation 97 %   Patient temperature 37.0    Collection site RIGHT RADIAL    Drawn by [id number]    Allens test (pass/fail) PASS PASS  Lactic acid, plasma     Status: Abnormal   Collection Time: 11/17/23  8:55 PM  Result Value Ref Range   Lactic Acid, Venous 4.8 (HH) 0.5 - 1.9 mmol/L  Lactic acid, plasma     Status: Abnormal   Collection Time: 11/17/23 11:33 PM  Result Value Ref Range   Lactic Acid, Venous 4.8 (HH) 0.5 - 1.9 mmol/L  Magnesium     Status: None   Collection Time: 11/17/23 11:33 PM  Result Value Ref Range   Magnesium 2.4 1.7 - 2.4 mg/dL  Basic metabolic panel     Status: Abnormal   Collection Time: 11/17/23 11:33 PM  Result Value Ref Range   Sodium 134 (L) 135 - 145 mmol/L   Potassium 4.5 3.5 - 5.1 mmol/L   Chloride 98 98 - 111 mmol/L   CO2 23 22 - 32 mmol/L   Glucose, Bld 189 (H) 70 - 99 mg/dL   BUN 47 (H) 8 - 23 mg/dL   Creatinine, Ser 0.08 (H) 0.61 - 1.24 mg/dL   Calcium 8.2 (L) 8.9 - 10.3 mg/dL   GFR, Estimated 43 (L) >60 mL/min   Anion gap 13 5 - 15  Brain natriuretic peptide     Status: Abnormal   Collection Time: 11/17/23 11:33 PM  Result Value Ref Range   B Natriuretic Peptide 752.7 (H) 0.0 - 100.0 pg/mL  CBC     Status: Abnormal   Collection Time: 11/17/23 11:33 PM  Result Value Ref Range   WBC 23.7 (H) 4.0 - 10.5 K/uL   RBC 4.78 4.22 - 5.81 MIL/uL   Hemoglobin 13.2 13.0 - 17.0 g/dL   HCT 67.6 19.5 - 09.3 %   MCV 88.3 80.0 - 100.0 fL   MCH 27.6 26.0 - 34.0 pg   MCHC 31.3 30.0 - 36.0 g/dL   RDW 26.7 (H) 12.4 -  15.5 %   Platelets 356 150 - 400 K/uL   nRBC 0.1 0.0 - 0.2 %  Procalcitonin     Status: None   Collection Time: 11/17/23 11:33 PM  Result Value Ref Range   Procalcitonin 0.23 ng/mL  Respiratory (~20  pathogens) panel by PCR     Status: Abnormal   Collection Time: 11/18/23  2:22 AM   Specimen: Nasopharyngeal Swab; Respiratory  Result Value Ref Range   Adenovirus NOT DETECTED NOT DETECTED   Coronavirus 229E NOT DETECTED NOT DETECTED   Coronavirus HKU1 NOT DETECTED NOT DETECTED   Coronavirus NL63 NOT DETECTED NOT DETECTED   Coronavirus OC43 DETECTED (A) NOT DETECTED   Metapneumovirus NOT DETECTED NOT DETECTED   Rhinovirus / Enterovirus NOT DETECTED NOT DETECTED   Influenza A NOT DETECTED NOT DETECTED   Influenza B NOT DETECTED NOT DETECTED   Parainfluenza Virus 1 NOT DETECTED NOT DETECTED   Parainfluenza Virus 2 NOT DETECTED NOT DETECTED   Parainfluenza Virus 3 NOT DETECTED NOT DETECTED   Parainfluenza Virus 4 NOT DETECTED NOT DETECTED   Respiratory Syncytial Virus NOT DETECTED NOT DETECTED   Bordetella pertussis NOT DETECTED NOT DETECTED   Bordetella Parapertussis NOT DETECTED NOT DETECTED   Chlamydophila pneumoniae NOT DETECTED NOT DETECTED   Mycoplasma pneumoniae NOT DETECTED NOT DETECTED  Resp panel by RT-PCR (RSV, Flu A&B, Covid) Anterior Nasal Swab     Status: None   Collection Time: 11/18/23  2:22 AM   Specimen: Anterior Nasal Swab  Result Value Ref Range   SARS Coronavirus 2 by RT PCR NEGATIVE NEGATIVE   Influenza A by PCR NEGATIVE NEGATIVE   Influenza B by PCR NEGATIVE NEGATIVE   Resp Syncytial Virus by PCR NEGATIVE NEGATIVE  Urinalysis, Routine w reflex microscopic -Urine, Clean Catch     Status: Abnormal   Collection Time: 11/18/23  2:28 AM  Result Value Ref Range   Color, Urine YELLOW YELLOW   APPearance CLEAR CLEAR   Specific Gravity, Urine 1.009 1.005 - 1.030   pH 5.0 5.0 - 8.0   Glucose, UA NEGATIVE NEGATIVE mg/dL   Hgb urine dipstick NEGATIVE NEGATIVE    Bilirubin Urine NEGATIVE NEGATIVE   Ketones, ur NEGATIVE NEGATIVE mg/dL   Protein, ur NEGATIVE NEGATIVE mg/dL   Nitrite POSITIVE (A) NEGATIVE   Leukocytes,Ua NEGATIVE NEGATIVE   RBC / HPF 0-5 0 - 5 RBC/hpf   WBC, UA 0-5 0 - 5 WBC/hpf   Bacteria, UA MANY (A) NONE SEEN   Squamous Epithelial / HPF 0-5 0 - 5 /HPF   Mucus PRESENT    Hyaline Casts, UA PRESENT   Basic metabolic panel     Status: Abnormal   Collection Time: 11/18/23  6:42 AM  Result Value Ref Range   Sodium 136 135 - 145 mmol/L   Potassium 5.0 3.5 - 5.1 mmol/L   Chloride 100 98 - 111 mmol/L   CO2 21 (L) 22 - 32 mmol/L   Glucose, Bld 108 (H) 70 - 99 mg/dL   BUN 46 (H) 8 - 23 mg/dL   Creatinine, Ser 1.61 (H) 0.61 - 1.24 mg/dL   Calcium 8.2 (L) 8.9 - 10.3 mg/dL   GFR, Estimated 49 (L) >60 mL/min   Anion gap 15 5 - 15  CBC     Status: Abnormal   Collection Time: 11/18/23  9:49 AM  Result Value Ref Range   WBC 25.1 (H) 4.0 - 10.5 K/uL   RBC 5.64 4.22 - 5.81 MIL/uL   Hemoglobin 15.4 13.0 - 17.0 g/dL   HCT 09.6 (H) 04.5 - 40.9 %   MCV 92.6 80.0 - 100.0 fL   MCH 27.3 26.0 - 34.0 pg   MCHC 29.5 (L) 30.0 - 36.0 g/dL   RDW 81.1 (H) 91.4 - 78.2 %  Platelets 331 150 - 400 K/uL   nRBC 0.0 0.0 - 0.2 %  Lactic acid, plasma     Status: Abnormal   Collection Time: 11/18/23  9:49 AM  Result Value Ref Range   Lactic Acid, Venous 2.8 (HH) 0.5 - 1.9 mmol/L  Expectorated Sputum Assessment w Gram Stain, Rflx to Resp Cult     Status: None   Collection Time: 11/18/23 12:30 PM   Specimen: Sputum  Result Value Ref Range   Specimen Description SPUTUM    Special Requests Normal    Sputum evaluation      THIS SPECIMEN IS ACCEPTABLE FOR SPUTUM CULTURE Performed at [facility], [address] [place], [place] [address]    Report Status 11/18/2023 FINAL    *Note: Due to a large number of results and/or encounters for the requested time period, some results have not been displayed. A complete set of results can be  found in Results Review.    I have reviewed pertinent nursing notes, vitals, labs, and images as necessary. I have ordered labwork to follow up on as indicated.  I have reviewed the last notes from staff over past 24 hours. I have discussed patient's care plan and test results with nursing staff, CM/SW, and other staff as appropriate.  Critical care time to evaluate and treat this patient was 55 minutes.  Independent of separate billable services  This patient is critically ill with the following life-threatening issues requiring my presence at the bedside: Hemodynamic instability requiring titration of medications Oxygenation/ventilation instability requiring frequent modifications of support Cardiac rhythm disturbances requiring evaluation and/or interventions Fluctuations in neurologic function requiring evaluation and/or interventions and/or fluid/volume titration    LOS: 0 days   Lewie Chamber, MD [facility] 11/18/2023, 2:51 PM

## 2023-11-18 NOTE — Assessment & Plan Note (Addendum)
-   RVP positive for coronavirus OC43 (non-covid) - CXR looks like diffuse GGO and he was very hypoxic and hypotensive on admission -CT chest obtained on 11/19/2023 showing significant involvement of right lung, sparing left lung for the most part but does have moderate-sized left pleural effusion - other considered differential per pulm was possible amio toxicity but I agree, would expect a bilateral effect rather than unilateral as being seen on CT chest  - continue nebs - added steroids given profound hypoxia; taper now planned - continue droplet precautions  -Diuresing intermittently with Lasix -Remains on Rocephin and doxycycline empirically

## 2023-11-18 NOTE — Plan of Care (Signed)

## 2023-11-18 NOTE — Assessment & Plan Note (Signed)
 -  Continue Lipitor

## 2023-11-19 ENCOUNTER — Other Ambulatory Visit (HOSPITAL_COMMUNITY)

## 2023-11-19 ENCOUNTER — Inpatient Hospital Stay (HOSPITAL_COMMUNITY)

## 2023-11-19 ENCOUNTER — Other Ambulatory Visit: Payer: Self-pay | Admitting: Family Medicine

## 2023-11-19 ENCOUNTER — Encounter (HOSPITAL_COMMUNITY): Payer: Self-pay | Admitting: Internal Medicine

## 2023-11-19 DIAGNOSIS — B342 Coronavirus infection, unspecified: Secondary | ICD-10-CM | POA: Diagnosis not present

## 2023-11-19 DIAGNOSIS — J9601 Acute respiratory failure with hypoxia: Secondary | ICD-10-CM | POA: Diagnosis not present

## 2023-11-19 DIAGNOSIS — J90 Pleural effusion, not elsewhere classified: Secondary | ICD-10-CM | POA: Diagnosis not present

## 2023-11-19 DIAGNOSIS — J129 Viral pneumonia, unspecified: Secondary | ICD-10-CM | POA: Diagnosis not present

## 2023-11-19 LAB — CBC WITH DIFFERENTIAL/PLATELET
Abs Immature Granulocytes: 0.24 10*3/uL — ABNORMAL HIGH (ref 0.00–0.07)
Basophils Absolute: 0 10*3/uL (ref 0.0–0.1)
Basophils Relative: 0 %
Eosinophils Absolute: 0 10*3/uL (ref 0.0–0.5)
Eosinophils Relative: 0 %
HCT: 42.7 % (ref 39.0–52.0)
Hemoglobin: 13.1 g/dL (ref 13.0–17.0)
Immature Granulocytes: 1 %
Lymphocytes Relative: 3 %
Lymphs Abs: 0.7 10*3/uL (ref 0.7–4.0)
MCH: 27.3 pg (ref 26.0–34.0)
MCHC: 30.7 g/dL (ref 30.0–36.0)
MCV: 89.1 fL (ref 80.0–100.0)
Monocytes Absolute: 1.3 10*3/uL — ABNORMAL HIGH (ref 0.1–1.0)
Monocytes Relative: 5 %
Neutro Abs: 22.6 10*3/uL — ABNORMAL HIGH (ref 1.7–7.7)
Neutrophils Relative %: 91 %
Platelets: 373 10*3/uL (ref 150–400)
RBC: 4.79 MIL/uL (ref 4.22–5.81)
RDW: 19.1 % — ABNORMAL HIGH (ref 11.5–15.5)
WBC: 24.8 10*3/uL — ABNORMAL HIGH (ref 4.0–10.5)
nRBC: 0 % (ref 0.0–0.2)

## 2023-11-19 LAB — GLUCOSE, CAPILLARY
Glucose-Capillary: 118 mg/dL — ABNORMAL HIGH (ref 70–99)
Glucose-Capillary: 154 mg/dL — ABNORMAL HIGH (ref 70–99)
Glucose-Capillary: 148 mg/dL — ABNORMAL HIGH (ref 70–99)
Glucose-Capillary: 215 mg/dL — ABNORMAL HIGH (ref 70–99)

## 2023-11-19 LAB — BODY FLUID CELL COUNT WITH DIFFERENTIAL
Lymphs, Fluid: 59 %
Monocyte-Macrophage-Serous Fluid: 19 % — ABNORMAL LOW (ref 50–90)
Neutrophil Count, Fluid: 22 % (ref 0–25)
Total Nucleated Cell Count, Fluid: 946 uL (ref 0–1000)

## 2023-11-19 LAB — LEGIONELLA PNEUMOPHILA SEROGP 1 UR AG: L. pneumophila Serogp 1 Ur Ag: NEGATIVE

## 2023-11-19 LAB — BASIC METABOLIC PANEL
Anion gap: 12 (ref 5–15)
BUN: 55 mg/dL — ABNORMAL HIGH (ref 8–23)
CO2: 26 mmol/L (ref 22–32)
Calcium: 8 mg/dL — ABNORMAL LOW (ref 8.9–10.3)
Chloride: 99 mmol/L (ref 98–111)
Creatinine, Ser: 1.52 mg/dL — ABNORMAL HIGH (ref 0.61–1.24)
GFR, Estimated: 44 mL/min — ABNORMAL LOW (ref >60)
Glucose, Bld: 146 mg/dL — ABNORMAL HIGH (ref 70–99)
Potassium: 3.8 mmol/L (ref 3.5–5.1)
Sodium: 137 mmol/L (ref 135–145)

## 2023-11-19 LAB — LACTATE DEHYDROGENASE, PLEURAL OR PERITONEAL FLUID: LD, Fluid: 182 U/L — ABNORMAL HIGH (ref 3–23)

## 2023-11-19 LAB — PROTEIN, PLEURAL OR PERITONEAL FLUID: Total protein, fluid: 3.2 g/dL

## 2023-11-19 LAB — T4, FREE: Free T4: 1.68 ng/dL — ABNORMAL HIGH (ref 0.61–1.12)

## 2023-11-19 LAB — LACTATE DEHYDROGENASE: LDH: 344 U/L — ABNORMAL HIGH (ref 98–192)

## 2023-11-19 LAB — GLUCOSE, PLEURAL OR PERITONEAL FLUID: Glucose, Fluid: 130 mg/dL

## 2023-11-19 LAB — MAGNESIUM: Magnesium: 2.7 mg/dL — ABNORMAL HIGH (ref 1.7–2.4)

## 2023-11-19 LAB — PROTEIN, TOTAL: Total Protein: 7.3 g/dL (ref 6.5–8.1)

## 2023-11-19 MED ORDER — ORAL CARE MOUTH RINSE
15.0000 mL | OROMUCOSAL | Status: DC
  Administered 2023-11-19 – 2023-11-20 (×3): 15 mL via OROMUCOSAL

## 2023-11-19 MED ORDER — LEVOTHYROXINE SODIUM 50 MCG PO TABS
175.0000 ug | ORAL_TABLET | Freq: Every day | ORAL | Status: DC
  Administered 2023-11-20 – 2023-11-27 (×8): 175 ug via ORAL
  Filled 2023-11-19 (×8): qty 1

## 2023-11-19 MED ORDER — FUROSEMIDE 10 MG/ML IJ SOLN
40.0000 mg | Freq: Once | INTRAMUSCULAR | Status: AC
  Administered 2023-11-19: 40 mg via INTRAVENOUS
  Filled 2023-11-19: qty 4

## 2023-11-19 MED ORDER — CHLORHEXIDINE GLUCONATE CLOTH 2 % EX PADS
6.0000 | MEDICATED_PAD | Freq: Every day | CUTANEOUS | Status: DC
Start: 2023-11-20 — End: 2023-11-27
  Administered 2023-11-20 – 2023-11-26 (×6): 6 via TOPICAL

## 2023-11-19 MED ORDER — ORAL CARE MOUTH RINSE: 15.0000 mL | OROMUCOSAL | Status: DC | PRN

## 2023-11-19 MED ORDER — SODIUM CHLORIDE (PF) 0.9 % IJ SOLN
INTRAMUSCULAR | Status: AC
  Filled 2023-11-19: qty 50

## 2023-11-19 NOTE — Progress Notes (Addendum)
 Progress Note    Justin [name]   ZOX:[id number]  DOB: August 09, 1934  DOA: 11/17/2023     1 PCP: Mast, Man X, NP  Initial CC: SOB  Hospital Course: Justin [name] is an 89 yo male with PMH BPH, CAD s/p CABG, CHF, HTN, HLD, DMII, AS s/p TAVR, PAF, hypothyroidism who presented with shortness of breath.  He was recently seen by primary care and also given abx due to symptoms along with prednisone.  He required BIPAP on admission and became progressively more hypotensive also.  CXR showed asymmetric interstitial opacities concerning for atypical pneumonia versus pulmonary edema or both. RVP swab positive for coronavirus OC 43.  He also became more hypoxic requiring salter high flow. WBC elevated at 29.7 on admission and he was also started on antibiotics. Other notable labs included elevated BNP, 880 and he was given lasix as well.   Interval History:  Persistent PVCs since yesterday.  Denies any chest pain or palpitations. Remains on salter high flow but does appear comfortable overall.  Assessment and Plan: * Acute hypoxic respiratory failure (HCC) - due to viral pna (see below) - requiring salter HF; not on O2 at baseline at home; wean as able - steroids added 3/25  Coronavirus infection - RVP positive for coronavirus OC43 (non-covid) - CXR looks like diffuse GGO and he was very hypoxic and hypotensive on admission -CT chest obtained on 11/19/2023 showing significant involvement of right lung, sparing left lung for the most part but does have moderate-sized left pleural effusion - continue nebs - add steroids given profound hypoxia  - continue droplet precautions  -Diuresing intermittently with Lasix  Type 2 diabetes mellitus (HCC) - A1c 6.8 % on 10/14/23 - continue on SSI and CBGs  MDD (major depressive disorder) - Continue Effexor  Chronic kidney disease, stage 3a (HCC) - patient has history of CKD3a. Baseline creat ~ 1.3 - 1.4, eGFR~ 50 - at baseline   Paroxysmal atrial  fibrillation (HCC) - Continue Pradaxa and amiodarone  Gastroesophageal reflux disease - Continue Protonix  Hypothyroidism - Continue Synthroid -TSH suppressed, 0.169 - Check free T4 and total T3  Essential hypertension - Significant hypotension on admission borderline requiring pressors; antihypertensives on hold  Hyperlipidemia - Continue Lipitor   Old records reviewed in assessment of this patient  Antimicrobials: Rocephin 11/18/2023 >> current Doxycycline 11/18/2023 >> current  DVT prophylaxis:  SCDs Start: 11/17/23 1536   Code Status:   Code Status: Limited: Do not attempt resuscitation (DNR) -DNR-LIMITED -Do Not Intubate/DNI   Mobility Assessment (Last 72 Hours)     Mobility Assessment   No documentation.           Barriers to discharge: None Disposition Plan: Home HH orders placed:  Status is: Inpatient  Objective: Blood pressure 110/68, pulse (!) 101, temperature 97.6 F (36.4 C), temperature source Oral, resp. rate (!) 32, height 5' 6.5" (1.689 m), weight 111.6 kg, SpO2 91%.  Examination:  Physical Exam Constitutional:      General: He is not in acute distress.    Appearance: Normal appearance.  HENT:     Head: Normocephalic and atraumatic.     Mouth/Throat:     Mouth: Mucous membranes are moist.  Eyes:     Extraocular Movements: Extraocular movements intact.  Cardiovascular:     Rate and Rhythm: Normal rate and regular rhythm.  Pulmonary:     Effort: Pulmonary effort is normal. No respiratory distress.     Breath sounds: Wheezing and rhonchi present.  Abdominal:  General: Bowel sounds are normal. There is no distension.     Palpations: Abdomen is soft.     Tenderness: There is no abdominal tenderness.  Musculoskeletal:        General: Normal range of motion.     Cervical back: Normal range of motion and neck supple.  Skin:    General: Skin is warm and dry.  Neurological:     General: No focal deficit present.     Mental Status: He  is alert.  Psychiatric:        Mood and Affect: Mood normal.        Behavior: Behavior normal.      Consultants:  Pulmonology  Procedures:    Data Reviewed: Results for orders placed or performed during the hospital encounter of 11/17/23 (from the past 24 hours)  Expectorated Sputum Assessment w Gram Stain, Rflx to Resp Cult     Status: None   Collection Time: 11/18/23 12:30 PM   Specimen: Sputum  Result Value Ref Range   Specimen Description SPUTUM    Special Requests Normal    Sputum evaluation      THIS SPECIMEN IS ACCEPTABLE FOR SPUTUM CULTURE Performed at [facility], [address], [place], [place] [address]    Report Status 11/18/2023 FINAL   Culture, Respiratory w Gram Stain     Status: None (Preliminary result)   Collection Time: 11/18/23 12:30 PM   Specimen: SPU  Result Value Ref Range   Specimen Description      SPUTUM Performed at [facility], [address], [place], [place] [address]    Special Requests      Normal Reflexed from [phone] Performed at [facility], [address] [place], [place] [address]    Gram Stain NO WBC SEEN NO ORGANISMS SEEN     Culture      CULTURE REINCUBATED FOR BETTER GROWTH Performed at [facility], [address], [place], [place] [address]    Report Status PENDING   Urine Culture (for pregnant, neutropenic or urologic patients or patients with an indwelling urinary catheter)     Status: Abnormal (Preliminary result)   Collection Time: 11/18/23 12:49 PM   Specimen: Urine, Clean Catch  Result Value Ref Range   Specimen Description      URINE, CLEAN CATCH Performed at [facility], [address] [place], [place] [address]    Special Requests      NONE Performed at [facility], [address] [place], [place] [address]    Culture (A)     >=100,000 COLONIES/mL GRAM NEGATIVE RODS SUSCEPTIBILITIES TO  FOLLOW Performed at [facility], [address] [place], [place] [address]    Report Status PENDING   TSH     Status: Abnormal   Collection Time: 11/18/23  3:56 PM  Result Value Ref Range   TSH 0.169 (L) 0.350 - 4.500 uIU/mL  Glucose, capillary     Status: Abnormal   Collection Time: 11/18/23  4:49 PM  Result Value Ref Range   Glucose-Capillary 151 (H) 70 - 99 mg/dL  Blood gas, arterial     Status: Abnormal   Collection Time: 11/18/23  5:25 PM  Result Value Ref Range   FIO2 60 %   Mode BILEVEL POSITIVE AIRWAY PRESSURE    RATE 12 resp/min   Inspiratory PAP 14 cmH2O   Expiratory PAP 8 cmH2O   pH, Arterial 7.49 (H) 7.35 -  7.45   pCO2 arterial 35 32 - 48 mmHg   pO2, Arterial 72 (L) 83 - 108 mmHg   Bicarbonate 26.8 20.0 - 28.0 mmol/L   Acid-Base Excess 3.4 (H) 0.0 - 2.0 mmol/L   O2 Saturation 97.8 %   Patient temperature 36.3    Collection site LEFT RADIAL    Drawn by [id number]    Allens test (pass/fail) PASS PASS  Lactic acid, plasma     Status: Abnormal   Collection Time: 11/18/23  6:00 PM  Result Value Ref Range   Lactic Acid, Venous 2.6 (HH) 0.5 - 1.9 mmol/L  Comprehensive metabolic panel     Status: Abnormal   Collection Time: 11/18/23  7:18 PM  Result Value Ref Range   Sodium 137 135 - 145 mmol/L   Potassium 3.8 3.5 - 5.1 mmol/L   Chloride 98 98 - 111 mmol/L   CO2 27 22 - 32 mmol/L   Glucose, Bld 167 (H) 70 - 99 mg/dL   BUN 54 (H) 8 - 23 mg/dL   Creatinine, Ser 4.40 (H) 0.61 - 1.24 mg/dL   Calcium 8.2 (L) 8.9 - 10.3 mg/dL   Total Protein 6.7 6.5 - 8.1 g/dL   Albumin 2.1 (L) 3.5 - 5.0 g/dL   AST 76 (H) 15 - 41 U/L   ALT 45 (H) 0 - 44 U/L   Alkaline Phosphatase 147 (H) 38 - 126 U/L   Total Bilirubin 0.8 0.0 - 1.2 mg/dL   GFR, Estimated 42 (L) >60 mL/min   Anion gap 12 5 - 15  Magnesium     Status: Abnormal   Collection Time: 11/18/23  7:18 PM  Result Value Ref Range   Magnesium 2.7 (H) 1.7 - 2.4 mg/dL  Glucose, capillary     Status: Abnormal   Collection  Time: 11/18/23  9:22 PM  Result Value Ref Range   Glucose-Capillary 161 (H) 70 - 99 mg/dL  CBC with Differential/Platelet     Status: Abnormal   Collection Time: 11/19/23  3:13 AM  Result Value Ref Range   WBC 24.8 (H) 4.0 - 10.5 K/uL   RBC 4.79 4.22 - 5.81 MIL/uL   Hemoglobin 13.1 13.0 - 17.0 g/dL   HCT 34.7 42.5 - 95.6 %   MCV 89.1 80.0 - 100.0 fL   MCH 27.3 26.0 - 34.0 pg   MCHC 30.7 30.0 - 36.0 g/dL   RDW 38.7 (H) 56.4 - 33.2 %   Platelets 373 150 - 400 K/uL   nRBC 0.0 0.0 - 0.2 %   Neutrophils Relative % 91 %   Neutro Abs 22.6 (H) 1.7 - 7.7 K/uL   Lymphocytes Relative 3 %   Lymphs Abs 0.7 0.7 - 4.0 K/uL   Monocytes Relative 5 %   Monocytes Absolute 1.3 (H) 0.1 - 1.0 K/uL   Eosinophils Relative 0 %   Eosinophils Absolute 0.0 0.0 - 0.5 K/uL   Basophils Relative 0 %   Basophils Absolute 0.0 0.0 - 0.1 K/uL   Immature Granulocytes 1 %   Abs Immature Granulocytes 0.24 (H) 0.00 - 0.07 K/uL  Basic metabolic panel     Status: Abnormal   Collection Time: 11/19/23  5:06 AM  Result Value Ref Range   Sodium 137 135 - 145 mmol/L   Potassium 3.8 3.5 - 5.1 mmol/L   Chloride 99 98 - 111 mmol/L   CO2 26 22 - 32 mmol/L   Glucose, Bld 146 (H) 70 - 99 mg/dL   BUN 55 (H)  8 - 23 mg/dL   Creatinine, Ser 0.98 (H) 0.61 - 1.24 mg/dL   Calcium 8.0 (L) 8.9 - 10.3 mg/dL   GFR, Estimated 44 (L) >60 mL/min   Anion gap 12 5 - 15  Magnesium     Status: Abnormal   Collection Time: 11/19/23  5:06 AM  Result Value Ref Range   Magnesium 2.7 (H) 1.7 - 2.4 mg/dL  Glucose, capillary     Status: Abnormal   Collection Time: 11/19/23  8:20 AM  Result Value Ref Range   Glucose-Capillary 118 (H) 70 - 99 mg/dL   Comment 1 Notify RN    Comment 2 Document in Chart   Glucose, capillary     Status: Abnormal   Collection Time: 11/19/23 12:03 PM  Result Value Ref Range   Glucose-Capillary 154 (H) 70 - 99 mg/dL   Comment 1 Notify RN    Comment 2 Document in Chart    *Note: Due to a large number of results  and/or encounters for the requested time period, some results have not been displayed. A complete set of results can be found in Results Review.    I have reviewed pertinent nursing notes, vitals, labs, and images as necessary. I have ordered labwork to follow up on as indicated.  I have reviewed the last notes from staff over past 24 hours. I have discussed patient's care plan and test results with nursing staff, CM/SW, and other staff as appropriate.  Critical care time to evaluate and treat this patient was 55 minutes.  Independent of separate billable services  This patient is critically ill with the following life-threatening issues requiring my presence at the bedside: Hemodynamic instability requiring titration of medications Oxygenation/ventilation instability requiring frequent modifications of support Cardiac rhythm disturbances requiring evaluation and/or interventions Fluctuations in neurologic function requiring evaluation and/or interventions and/or fluid/volume titration    LOS: 1 day   Lewie Chamber, MD [facility] 11/19/2023, 12:19 PM

## 2023-11-19 NOTE — TOC Initial Note (Signed)
 Transition of Care [facility]) - Initial/Assessment Note    Patient Details  Name: Justin [name] MRN: [id number] Date of Birth: 29-Jul-1934  Transition of Care [facility]) CM/SW Contact:    Diona Browner, LCSW Phone Number: 11/19/2023, 1:01 PM  Clinical Narrative:                 Pt from [facility]. Pt continues medical work up. TOC following for needs.     Barriers to Discharge: Continued Medical Work up   Patient Goals and CMS Choice Patient states their goals for this hospitalization and ongoing recovery are:: return to ILF CMS Medicare.gov Compare Post Acute Care list provided to::  (NA) Choice offered to / list presented to : NA [place] ownership interest in [facility].provided to::  (NA)    Expected Discharge Plan and Services In-house Referral: NA     Living arrangements for the past 2 months: Independent Living Facility                 DME Arranged: N/A DME Agency: NA       HH Arranged: NA HH Agency: NA        Prior Living Arrangements/Services Living arrangements for the past 2 months: Independent Living Facility Lives with:: Significant Other Patient language and need for interpreter reviewed:: Yes Do you feel safe going back to the place where you live?: Yes      Need for Family Participation in Patient Care: Yes (Comment) Care giver support system in place?: Yes (comment)   Criminal Activity/Legal Involvement Pertinent to Current Situation/Hospitalization: No - Comment as needed  Activities of Daily Living   ADL Screening (condition at time of admission) Independently performs ADLs?: Yes (appropriate for developmental age) Is the patient deaf or have difficulty hearing?: No Does the patient have difficulty seeing, even when wearing glasses/contacts?: Yes Does the patient have difficulty concentrating, remembering, or making decisions?: No  Permission Sought/Granted                  Emotional Assessment Appearance::  Appears stated age Attitude/Demeanor/Rapport: Engaged Affect (typically observed): Accepting Orientation: : Oriented to Self, Oriented to Place, Oriented to  Time, Oriented to Situation Alcohol / Substance Use: Not Applicable Psych Involvement: No (comment)  Admission diagnosis:  Acute respiratory failure with hypoxia (HCC) [J96.01] Acute on chronic congestive heart failure, unspecified heart failure type (HCC) [I50.9] Acute hypoxic respiratory failure (HCC) [J96.01] Acute hypoxemic respiratory failure (HCC) [J96.01] Patient Active Problem List   Diagnosis Date Noted   Acute hypoxemic respiratory failure (HCC) 11/18/2023   Coronavirus infection 11/18/2023   Acute hypoxic respiratory failure (HCC) 11/17/2023   Bronchitis 11/06/2023   Type 2 diabetes mellitus (HCC) 10/23/2023   Leukocytosis 10/16/2023   MDD (major depressive disorder) 10/09/2023   Permanent atrial fibrillation (HCC) 09/01/2023   Right ventricular dysfunction 07/05/2023   Biventricular cardiac pacemaker in situ 07/05/2023   Chronic kidney disease, stage 3a (HCC) 07/05/2023   Persistent atrial fibrillation (HCC) 07/05/2023   Long term (current) use of anticoagulants 07/05/2023   Hx of CABG 07/05/2023   Atherosclerosis of native coronary artery of native heart without angina pectoris 07/05/2023   Acute exacerbation of congestive heart failure (HCC) 06/30/2023   NSVT (nonsustained ventricular tachycardia) (HCC) 05/23/2022   Sinus node dysfunction (HCC) 05/23/2022   PVC's (premature ventricular contractions) 05/23/2022   Acute on chronic combined systolic and diastolic heart failure (HCC) 03/12/2022   Biventricular implantable cardioverter-defibrillator (ICD) in situ 03/12/2022   Atypical  atrial flutter (HCC) 01/30/2022   Paroxysmal atrial fibrillation (HCC)    Secondary hypercoagulable state (HCC) 04/19/2021   Raynaud's phenomenon without gangrene 07/11/2020   Vertigo 07/11/2020   Loosening of knee joint prosthesis  (HCC) 05/18/2019   Cough 04/30/2019   Gastroesophageal reflux disease 04/30/2019   S/P ICD (internal cardiac defibrillator) procedure 05/03/2017   NICM (nonischemic cardiomyopathy) (HCC) 05/02/2017   History of transcatheter aortic valve replacement (TAVR) 03/04/2017   Severe aortic valve stenosis 03/04/2017   Hypokalemia due to loss of potassium with diuresis 01/10/2017   Aortic stenosis 01/10/2017   Systolic heart failure (HCC) 01/10/2017   Chronic congestive heart failure (HCC) 01/07/2017   Hypothyroidism 12/23/2016   Prostate cancer (HCC)    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   Edema of both legs 09/01/2014   Asthma, intermittent 09/01/2014   Epiphora due to insufficient drainage 07/02/2011   Rosacea blepharoconjunctivitis 07/02/2011   Coronary artery disease involving native coronary artery of native heart without angina pectoris    GOUT 09/12/2009   CAD, AUTOLOGOUS BYPASS GRAFT 05/10/2009   MURMUR 05/08/2009   BRUIT 05/08/2009   MUSCLE CRAMPS 12/12/2008   KNEE SPRAIN 09/21/2008   Osteoarthritis 07/07/2008   BPH with urinary obstruction 02/01/2008   LOW BACK PAIN SYNDROME 02/01/2008   TESTOSTERONE DEFICIENCY 07/03/2007   Hyperlipidemia 07/03/2007   Essential hypertension 07/03/2007   MYOCARDIAL INFARCTION, HX OF 07/03/2007   Allergic rhinitis 07/03/2007   History of colonic polyps 07/03/2007   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   PCP:  Mast, Man X, NP Pharmacy:   [facility] [place], [place] - [address] [facility] [address] [place] [address] Phone: [phone] Fax: [phone]  [facility] - [place], [place] - [address] [place] [address] Phone: [phone] Fax: [phone]     Social Drivers of Health (SDOH) Social History: SDOH Screenings   Food Insecurity: No Food Insecurity (11/18/2023)  Housing: Low Risk  (11/18/2023)  Transportation Needs: No Transportation  Needs (11/18/2023)  Utilities: Not At Risk (11/18/2023)  Alcohol Screen: Low Risk  (03/31/2023)  Depression (PHQ2-9): High Risk (11/13/2023)  Financial Resource Strain: Low Risk  (03/31/2023)  Physical Activity: Sufficiently Active (03/31/2023)  Social Connections: Socially Integrated (11/18/2023)  Stress: No Stress Concern Present (03/31/2023)  Tobacco Use: Medium Risk (11/17/2023)  Health Literacy: Adequate Health Literacy (03/31/2023)   SDOH Interventions:     Readmission Risk Interventions    11/19/2023    1:00 PM  Readmission Risk Prevention Plan  Transportation Screening Complete  PCP or Specialist Appt within 3-5 Days Complete  HRI or Home Care Consult Complete  Social Work Consult for Recovery Care Planning/Counseling Complete  Palliative Care Screening Not Applicable  Medication Review Oceanographer) Complete

## 2023-11-19 NOTE — Progress Notes (Signed)
   11/19/23 2234  BiPAP/CPAP/SIPAP  BiPAP/CPAP/SIPAP Pt Type Adult  BiPAP/CPAP/SIPAP V60  Mask Type Full face mask  Dentures removed? Not applicable  Mask Size Large  Respiratory Rate 16 breaths/min  IPAP 14 cmH20  EPAP 8 cmH2O  FiO2 (%) (S)  40 %  Minute Ventilation 12.9  Leak 26  Peak Inspiratory Pressure (PIP) 15  Tidal Volume (Vt) 721  Patient Home Machine No  Patient Home Mask No  Patient Home Tubing No  Auto Titrate No  Press High Alarm 25 cmH2O  Press Low Alarm 5 cmH2O  CPAP/SIPAP surface wiped down Yes  BiPAP/CPAP /SiPAP Vitals  Pulse Rate (!) 104  Resp 16  BP 135/86  Bilateral Breath Sounds Diminished

## 2023-11-19 NOTE — Progress Notes (Signed)
 NAME:  Justin [name], MRN:  [id number], DOB:  05/10/1934, LOS: 1 ADMISSION DATE:  11/17/2023, CONSULTATION DATE:  11/18/23 REFERRING MD:  EDP, CHIEF COMPLAINT:  hypotension   History of Present Illness:  89 yo male presented to [facility]  early today after reporting increasing fatigue and hypoxia at home. Pt has been dealing with this for a few days, he states today his shortness of breath increased. He had previously been seen by his PCP for symptoms and was diagnosed with bronchitis. He was started on amoxicillin and without improvement changed to doxy and prednisone.   Upon presentation pt was found to be hypoxic in the 70's. Cxr c/w multilobar pna with possible superimposed edema. Wbc elevated as well as BNP.   Pt denies cp, dizziness, no fever chills at home, worsening exertional dyspnea.   At this time pt states he is comfortable but would prefer nasal cannula over NIV. No other complaints  Pertinent  Medical History  Cad s/p cabg H/o chf combined systolic/diastolic Htn Hyperlipidemia hypothyroidism Ao stenosis s/p TAVR Pafib bph  Significant Hospital Events: Including procedures, antibiotic start and stop dates in addition to other pertinent events   Admitted to [facility] 3/24 Transferred to ICU 3/25 2/2 increasing oxygen requirement and marginal pressure 3/26 improving, though still with significant O2 requirement, plan to obtain CT chest  Interim History / Subjective:  Pt is more alert today, states he had a good night and wore Bipap for about 5hours, remains on 15L HFNC  Echo yesterday with unchanged EF of 40-45% and L lateral pleural effusion   Objective   Blood pressure (!) 119/57, pulse 90, temperature 98.2 F (36.8 C), temperature source Axillary, resp. rate (!) 30, height 5' 6.5" (1.689 m), weight 111.6 kg, SpO2 94%.    FiO2 (%):  [50 %-60 %] 50 %   Intake/Output Summary (Last 24 hours) at 11/19/2023 4098 Last data filed at 11/19/2023 0300 Gross per 24 hour  Intake 220  ml  Output 1850 ml  Net -1630 ml   Filed Weights   11/17/23 2244  Weight: 111.6 kg   General:  well nourished elderly M sitting up in bed eating breakfast  HEENT: MM pink/moist, HFNC in place Neuro: alert, moving all extremities to command and oriented to person and place  CV: s1s2 rrr, no m/r/g PULM:  diminished air entry bilaterally with scattered crackles RLL GI: soft, bsx4 active  Extremities: warm/dry, trace pre-tibial  edema  Skin: no rashes or lesions  Labs: Lactic 4.8>2.8 WBC 25.1 Creat 1.38 (near baseline) UA +bacteria/nitrites  Lines/tubes/drains: Condom cath   I/O: UOP 1.8L yesterday Net -1.6L   Micro: Legionella>pending Strep pneumo>negative Covid/flu>negative RVP + coronavirus OC43 BC> UC>prelim growing >100k gm -rods   Resolved Hospital Problem list     Assessment & Plan:    Acute hypoxic resp failure Viral Pneumonia Sepsis with marginal pressures (not req pressors) Possible UTI RVP + for Coronavirus  UC growing gm negative rods -continue HFNC to maintain sats >92% -low likelihood PE as was anti-coagulated on Pradaxa, but obtain CT chest without contrast to further characterize infiltrate and pleural effusion, may need thoracentesis   -follow blood and urine cultures to completion -repeat lactic down-trending  -continue Ceftriaxone and Doxycycline  -IS -continue pulmicort, brovana and xopanex -currently not requiring pressors   Acute on chronic decompensated systolic/diastolic hf Hypotension, not req pressors Atrial Fibrillation  -BNP 752 and trop negative -repeat echo without worsening EF, remains 40-45% -responded well to Lasix yesterday, additional 40mg   today -hold bb, arb, imdur for now -continue po amio, Asa, statin,   Hypothyroidism -continue Synthroid    Best Practice (right click and "Reselect all SmartList Selections" daily)   Per primary  Labs   CBC: Recent Labs  Lab 11/17/23 1211 11/17/23 2333 11/18/23 0949  11/19/23 0313  WBC 29.7* 23.7* 25.1* 24.8*  NEUTROABS 24.2*  --   --  22.6*  HGB 12.8* 13.2 15.4 13.1  HCT 41.4 42.2 52.2* 42.7  MCV 87.7 88.3 92.6 89.1  PLT 347 356 331 373    Basic Metabolic Panel: Recent Labs  Lab 11/17/23 1211 11/17/23 2333 11/18/23 0642 11/18/23 1918 11/19/23 0506  NA 136 134* 136 137 137  K 4.3 4.5 5.0 3.8 3.8  CL 99 98 100 98 99  CO2 24 23 21* 27 26  GLUCOSE 127* 189* 108* 167* 146*  BUN 41* 47* 46* 54* 55*  CREATININE 1.42* 1.56* 1.38* 1.57* 1.52*  CALCIUM 8.3* 8.2* 8.2* 8.2* 8.0*  MG  --  2.4  --  2.7* 2.7*   GFR: Estimated Creatinine Clearance: 40.5 mL/min (A) (by C-G formula based on SCr of 1.52 mg/dL (H)). Recent Labs  Lab 11/17/23 1211 11/17/23 2055 11/17/23 2333 11/18/23 0949 11/18/23 1800 11/19/23 0313  PROCALCITON  --   --  0.23  --   --   --   WBC 29.7*  --  23.7* 25.1*  --  24.8*  LATICACIDVEN  --  4.8* 4.8* 2.8* 2.6*  --     Liver Function Tests: Recent Labs  Lab 11/17/23 1211 11/18/23 1918  AST 53* 76*  ALT 38 45*  ALKPHOS 135* 147*  BILITOT 1.7* 0.8  PROT 6.9 6.7  ALBUMIN 2.2* 2.1*   No results for input(s): "LIPASE", "AMYLASE" in the last 168 hours. No results for input(s): "AMMONIA" in the last 168 hours.  ABG    Component Value Date/Time   PHART 7.49 (H) 11/18/2023 1725   PCO2ART 35 11/18/2023 1725   PO2ART 72 (L) 11/18/2023 1725   HCO3 26.8 11/18/2023 1725   TCO2 25 11/08/2021 0858   O2SAT 97.8 11/18/2023 1725     Coagulation Profile: No results for input(s): "INR", "PROTIME" in the last 168 hours.  Cardiac Enzymes: No results for input(s): "CKTOTAL", "CKMB", "CKMBINDEX", "TROPONINI" in the last 168 hours.  HbA1C: Hgb A1c MFr Bld  Date/Time Value Ref Range Status  10/14/2023 07:29 AM 6.8 (H) <5.7 % of total Hgb Final    Comment:    For someone without known diabetes, a hemoglobin A1c value of 6.5% or greater indicates that they may have  diabetes and this should be confirmed with a follow-up   test. . For someone with known diabetes, a value <7% indicates  that their diabetes is well controlled and a value  greater than or equal to 7% indicates suboptimal  control. A1c targets should be individualized based on  duration of diabetes, age, comorbid conditions, and  other considerations. . Currently, no consensus exists regarding use of hemoglobin A1c for diagnosis of diabetes for children. [name]   10/10/2022 11:43 AM 6.4 4.6 - 6.5 % Final    Comment:    Glycemic Control Guidelines for People with Diabetes:Non Diabetic:  <6%Goal of Therapy: <7%Additional Action Suggested:  >8%     CBG: Recent Labs  Lab 11/18/23 1649 11/18/23 2122 11/19/23 0820  GLUCAP 151* 161* 118*    Review of Systems:   As per HPI  Past Medical History:  He,  has a past  medical history of Age-related macular degeneration, dry, both eyes, Allergic, Anal fissure, Asthma, Benign prostatic hypertrophy, CAD (coronary artery disease), Carotid bruit, Chronic combined systolic and diastolic CHF (congestive heart failure) (HCC), Complication of anesthesia (1980s), Congestive heart failure (CHF) (HCC), ED (erectile dysfunction), Family history of adverse reaction to anesthesia, GERD (gastroesophageal reflux disease), Gout, HTN (hypertension), colonic polyps, Hyperlipidemia, Hypothyroidism, Moderate to severe aortic stenosis, Myocardial infarction (HCC) (~ 2000), Obesity, Osteoarthritis, PAF (paroxysmal atrial fibrillation) (HCC), Precancerous skin lesion, Prostate cancer (HCC) (dx'd ~ 2014), 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   Surgical History:   Past Surgical History:  Procedure Laterality Date   ANUS SURGERY     "opened it back up cause it wouldn't heal; wound up w/a fissure" (01/07/2017)   BIV ICD INSERTION CRT-D N/A 05/02/2017   Procedure: BIV ICD INSERTION CRT-D;  Surgeon: Duke Salvia, MD;  Location: [name] INVASIVE CV LAB;  Service: Cardiovascular;   Laterality: N/A;   CARDIAC CATHETERIZATION  10/29/2005   CARDIAC CATHETERIZATION N/A 08/27/2016   Procedure: Right/Left Heart Cath and Coronary/Graft Angiography;  Surgeon: Kathleene Hazel, MD;  Location: [facility] INVASIVE CV LAB;  Service: Cardiovascular;  Laterality: N/A;   CARDIOVERSION N/A 04/24/2021   Procedure: CARDIOVERSION;  Surgeon: Pricilla Riffle, MD;  Location: [facility] ENDOSCOPY;  Service: Cardiovascular;  Laterality: N/A;   CARDIOVERSION N/A 11/08/2021   Procedure: CARDIOVERSION;  Surgeon: Chrystie Nose, MD;  Location: [facility] ENDOSCOPY;  Service: Cardiovascular;  Laterality: N/A;   CARDIOVERSION N/A 02/06/2022   Procedure: CARDIOVERSION;  Surgeon: Wendall Stade, MD;  Location: [facility] ENDOSCOPY;  Service: Cardiovascular;  Laterality: N/A;   CATARACT EXTRACTION W/ INTRAOCULAR LENS  IMPLANT, BILATERAL Bilateral    CATARACT EXTRACTION, BILATERAL  2012   COLONOSCOPY  06/30/2008   no repeats needed    COLONOSCOPY     had 3 or 4 in the past    CORONARY ARTERY BYPASS GRAFT  2007   "CABG X4"   CYST EXCISION PERINEAL  1980s   HAMMER TOE SURGERY Bilateral    ICD GENERATOR CHANGEOUT N/A 05/30/2023   Procedure: ICD GENERATOR CHANGEOUT;  Surgeon: Duke Salvia, MD;  Location: [facility] INVASIVE CV LAB;  Service: Cardiovascular;  Laterality: N/A;   JOINT REPLACEMENT     KNEE ARTHROPLASTY  07/30/2011   Procedure: COMPUTER ASSISTED TOTAL KNEE ARTHROPLASTY;  Surgeon: Cammy Copa;  Location: [facility] OR;  Service: Orthopedics;  Laterality: Left;  left total knee arthroplasty   MASTECTOMY SUBCUTANEOUS Bilateral    MULTIPLE TOOTH EXTRACTIONS     ORIF FINGER / THUMB FRACTURE Right ~ 1980   "repair of thumb injury"   PROSTATE BIOPSY     REPLACEMENT TOTAL KNEE BILATERAL Bilateral 2012   TEE WITHOUT CARDIOVERSION N/A 03/04/2017   Procedure: TRANSESOPHAGEAL ECHOCARDIOGRAM (TEE);  Surgeon: Kathleene Hazel, MD;  Location: [facility] OR;  Service: Open Heart Surgery;  Laterality: N/A;   TOTAL KNEE REVISION Right 05/18/2019    Procedure: RIGHT PATELLA REVISION/REMOVAL;  Surgeon: Cammy Copa, MD;  Location: [facility] OR;  Service: Orthopedics;  Laterality: Right;   TRANSCATHETER AORTIC VALVE REPLACEMENT, TRANSFEMORAL N/A 03/04/2017   Procedure: TRANSCATHETER AORTIC VALVE REPLACEMENT, TRANSFEMORAL;  Surgeon: Kathleene Hazel, MD;  Location: [facility] OR;  Service: Open Heart Surgery;  Laterality: N/A;     Social History:   reports that he quit smoking about 62 years ago. His smoking use included cigarettes. He started smoking about 75 years ago. He has a 45.5 pack-year smoking history.  He has never used smokeless tobacco. He reports that he does not currently use alcohol after a past usage of about 7.0 standard drinks of alcohol per week. He reports that he does not use drugs.   Family History:  His family history includes Allergic rhinitis in his father; Asthma in his father; Breast cancer in his mother; Heart attack (age of onset: 36) in his father; Heart failure (age of onset: 81) in his mother; Uterine cancer in his mother. There is no history of Colon cancer or Esophageal cancer.   Allergies Allergies  Allergen Reactions   Peanut-Containing Drug Products Anaphylaxis   Sulfonamide Derivatives Anaphylaxis   Amlodipine Swelling and Other (See Comments)    Swelling in ankles   Eliquis [Apixaban] Other (See Comments)    Back/hip pain   Lisinopril Cough   Xarelto [Rivaroxaban] Other (See Comments)    Back/hip pain     Home Medications  Prior to Admission medications   Medication Sig Start Date End Date Taking? Authorizing Provider  albuterol (VENTOLIN HFA) 108 (90 Base) MCG/ACT inhaler Inhale 2 puffs into the lungs every 4 (four) hours as needed for wheezing or shortness of breath. 05/06/23  Yes Nelwyn Salisbury, MD  allopurinol (ZYLOPRIM) 100 MG tablet Take 1 tablet (100 mg total) by mouth daily. 09/18/23  Yes Nelwyn Salisbury, MD  amiodarone (PACERONE) 200 MG tablet Take 2 tablets by mouth twice daily x 2 weeks  then decrease to 2 tablets by mouth daily x 2 weeks 11/13/22  Yes Duke Salvia, MD  aspirin 81 MG tablet Take 1 tablet (81 mg total) by mouth daily. 05/19/19  Yes Magnant, Charles L, PA-C  atorvastatin (LIPITOR) 20 MG tablet TAKE ONE TABLET ONCE DAILY 10/29/23  Yes Kathleene Hazel, MD  cetirizine (ZYRTEC) 10 MG tablet Take 10 mg by mouth 2 (two) times daily.   Yes [provider]  colchicine 0.6 MG tablet Take 1 tablet (0.6 mg total) by mouth every 6 (six) hours as needed (gout). 09/18/23 09/17/24 Yes Nelwyn Salisbury, MD  Continuous Glucose Receiver (DEXCOM G6 RECEIVER) DEVI 1 Device by Does not apply route 3 (three) times daily. E11.69 10/23/23  Yes Mast, Man X, NP  Continuous Glucose Sensor (DEXCOM G6 SENSOR) MISC 1 Device by Does not apply route 3 (three) times daily. E11.69 10/23/23  Yes Mast, Man X, NP  Continuous Glucose Transmitter (DEXCOM G6 TRANSMITTER) MISC 1 Device by Does not apply route 3 (three) times daily. E11.69 10/23/23  Yes Mast, Man X, NP  dabigatran (PRADAXA) 150 MG CAPS capsule TAKE (1) CAPSULE TWICE DAILY. 10/24/23  Yes Kathleene Hazel, MD  dapagliflozin propanediol (FARXIGA) 10 MG TABS tablet Take 10 mg by mouth daily before breakfast.   Yes [provider]  dextromethorphan-guaiFENesin (MUCINEX DM) 30-600 MG 12hr tablet Take 1 tablet by mouth 2 (two) times daily for 14 days. 11/13/23 11/27/23 Yes Medina-Vargas, Monina C, NP  doxycycline (VIBRA-TABS) 100 MG tablet Take 1 tablet (100 mg total) by mouth 2 (two) times daily for 7 days. 11/13/23 11/20/23 Yes Medina-Vargas, Monina C, NP  isosorbide mononitrate (IMDUR) 30 MG 24 hr tablet Take 1 tablet (30 mg total) by mouth daily. 09/02/23 12/01/23 Yes Kathleene Hazel, MD  levothyroxine (SYNTHROID) 200 MCG tablet Take 1 tablet (200 mcg total) by mouth daily. 12/25/22  Yes Nelwyn Salisbury, MD  losartan (COZAAR) 25 MG tablet Take 0.5 tablets (12.5 mg total) by mouth daily. 07/06/23 07/05/24 Yes Dorcas Carrow, MD   magic  mouthwash SOLN Take 5 mLs by mouth as directed. Suspension contains equal amounts of Maalox Extra Strength, nystatin, and diphenhydramine. RX'ed by dentist   Yes [provider]  methocarbamol (ROBAXIN) 500 MG tablet Take 1 tablet (500 mg total) by mouth 4 (four) times daily as needed for muscle spasms. 07/07/23  Yes London Sheer, MD  metoCLOPramide (REGLAN) 10 MG tablet TAKE ONE TABLET BY MOUTH EVERY MORNING WITH BREAKFAST 08/29/23  Yes Nelwyn Salisbury, MD  metolazone (ZAROXOLYN) 2.5 MG tablet Take 1 tablet (2.5 mg total) by mouth once a week every Saturday 10/27/23  Yes Duke Salvia, MD  metoprolol succinate (TOPROL-XL) 50 MG 24 hr tablet Take 2 tablets (100 mg total) by mouth daily. Take with or immediately following a meal. 05/30/23  Yes Duke Salvia, MD  montelukast (SINGULAIR) 10 MG tablet TAKE ONE TABLET BY MOUTH ONCE DAILY IN THE EVENING 08/20/22  Yes Hunsucker, Lesia Sago, MD  Multiple Minerals-Vitamins (CAL MAG ZINC +D3 PO) Take 2 tablets by mouth in the morning. 500 mg / 7 mg / 25 mcg   Yes [provider]  Multiple Vitamins-Minerals (MULTI-VITAMIN GUMMIES) CHEW Chew 2 tablets by mouth See admin instructions. Chew 2 gummies by mouth in the morning   Yes [provider]  Multiple Vitamins-Minerals (PRESERVISION AREDS 2) CAPS Take 1 capsule by mouth in the morning and at bedtime.   Yes [provider]  omeprazole (PRILOSEC) 20 MG capsule TAKE ONE CAPSULE TWICE DAILY 08/01/23  Yes Nelwyn Salisbury, MD  pantoprazole (PROTONIX) 40 MG tablet Take 1 tablet (40 mg total) by mouth daily. 10/23/23  Yes Mast, Man X, NP  potassium chloride SA (KLOR-CON M) 20 MEQ tablet Take 1 tablet (20 mEq total) by mouth 2 (two) times daily. 02/18/23  Yes Graciella Freer, PA-C  predniSONE (DELTASONE) 20 MG tablet Take 1 tablet (20 mg total) by mouth daily with breakfast for 5 days. 11/13/23 11/18/23 Yes Medina-Vargas, Monina C, NP  silver sulfADIAZINE (SILVADENE) 1 %  cream Apply 1 Application topically 2 (two) times daily. 05/14/23  Yes Nelwyn Salisbury, MD  spironolactone (ALDACTONE) 25 MG tablet Take 1 tablet (25 mg total) by mouth daily. 10/24/23  Yes Kathleene Hazel, MD  torsemide (DEMADEX) 20 MG tablet Take 2 tablets (40 mg total) every other day, alternating with 1 tablet (20 mg total) every other day. 02/18/23  Yes Graciella Freer, PA-C  triamcinolone cream (KENALOG) 0.1 % Apply 1 application  topically daily as needed for dry skin (affected areas). 04/15/17  Yes [provider]  valACYclovir (VALTREX) 500 MG tablet Take 500 mg by mouth 2 (two) times daily as needed (cold sores).   Yes [provider]  venlafaxine XR (EFFEXOR XR) 37.5 MG 24 hr capsule Take 1 capsule (37.5 mg total) by mouth daily with breakfast. 10/23/23  Yes Mast, Man X, NP  benzonatate (TESSALON) 200 MG capsule Take 1 capsule (200 mg total) by mouth 2 (two) times daily as needed for cough. 05/06/23   Nelwyn Salisbury, MD  ketotifen (ZADITOR) 0.025 % ophthalmic solution Place 1 drop into both eyes daily as needed (for itching).    [provider]     Critical care time:          Darcella Gasman Jamori Biggar, PA-C [facility] See Amion for pager If no response to pager , please call [phone] until 7pm After 7:00 pm call Elink  [phone]

## 2023-11-19 NOTE — Progress Notes (Signed)
   11/19/23 1958  BiPAP/CPAP/SIPAP  Reason BIPAP/CPAP not in use Other(comment)   Pt seen and given scheduled neb treatment which he tolerated well.  Pt awake, sitting up, no increased wob / respiratory distress noted or voiced by patient.  HR97, RR19, SPo2 97% on 9L hfnc.  Bipap remains in room on standby but not indicated at this time.  O2 weaned down to 8L post neb tx.

## 2023-11-19 NOTE — Plan of Care (Signed)

## 2023-11-19 NOTE — Procedures (Signed)
 Thoracentesis  Procedure Note  Justin [name]  [id number]  20-May-1934  Date:11/19/23  Time:5:48 PM   Provider Performing:Pheng Prokop V. Cheyanne Lamison   Procedure: Thoracentesis with imaging guidance (40981)  Indication(s) Pleural Effusion  Consent Risks of the procedure as well as the alternatives and risks of each were explained to the patient and/or caregiver.  Consent for the procedure was obtained and is signed in the bedside chart  Anesthesia Topical only with 1% lidocaine    Time Out Verified patient identification, verified procedure, site/side was marked, verified correct patient position, special equipment/implants available, medications/allergies/relevant history reviewed, required imaging and test results available.   Sterile Technique Maximal sterile technique including full sterile barrier drape, hand hygiene, sterile gown, sterile gloves, mask, hair covering, sterile ultrasound probe cover (if used).  Procedure Description Ultrasound was used to identify appropriate pleural anatomy for placement and overlying skin marked.  Area of drainage cleaned and draped in sterile fashion. Lidocaine was used to anesthetize the skin and subcutaneous tissue.  1000 cc's of bloody appearing fluid was drained from the left pleural space. Catheter then removed and bandaid applied to site.   Complications/Tolerance None; patient tolerated the procedure well. Chest X-ray is ordered to confirm no post-procedural complication.   EBL Minimal   Specimen(s) Pleural fluid   Justin [name] V. Justin Loll MD

## 2023-11-20 ENCOUNTER — Inpatient Hospital Stay (HOSPITAL_COMMUNITY)

## 2023-11-20 DIAGNOSIS — B342 Coronavirus infection, unspecified: Secondary | ICD-10-CM | POA: Diagnosis not present

## 2023-11-20 DIAGNOSIS — R8271 Bacteriuria: Secondary | ICD-10-CM

## 2023-11-20 DIAGNOSIS — E039 Hypothyroidism, unspecified: Secondary | ICD-10-CM

## 2023-11-20 DIAGNOSIS — J90 Pleural effusion, not elsewhere classified: Secondary | ICD-10-CM

## 2023-11-20 DIAGNOSIS — J9601 Acute respiratory failure with hypoxia: Secondary | ICD-10-CM | POA: Diagnosis not present

## 2023-11-20 DIAGNOSIS — I5022 Chronic systolic (congestive) heart failure: Secondary | ICD-10-CM | POA: Diagnosis not present

## 2023-11-20 DIAGNOSIS — J129 Viral pneumonia, unspecified: Secondary | ICD-10-CM | POA: Diagnosis not present

## 2023-11-20 LAB — GLUCOSE, CAPILLARY
Glucose-Capillary: 128 mg/dL — ABNORMAL HIGH (ref 70–99)
Glucose-Capillary: 134 mg/dL — ABNORMAL HIGH (ref 70–99)
Glucose-Capillary: 147 mg/dL — ABNORMAL HIGH (ref 70–99)
Glucose-Capillary: 185 mg/dL — ABNORMAL HIGH (ref 70–99)

## 2023-11-20 LAB — CBC WITH DIFFERENTIAL/PLATELET
Abs Immature Granulocytes: 0.13 10*3/uL — ABNORMAL HIGH (ref 0.00–0.07)
Basophils Absolute: 0 10*3/uL (ref 0.0–0.1)
Basophils Relative: 0 %
Eosinophils Absolute: 0.3 10*3/uL (ref 0.0–0.5)
Eosinophils Relative: 2 %
HCT: 45.2 % (ref 39.0–52.0)
Hemoglobin: 14 g/dL (ref 13.0–17.0)
Immature Granulocytes: 1 %
Lymphocytes Relative: 3 %
Lymphs Abs: 0.6 10*3/uL — ABNORMAL LOW (ref 0.7–4.0)
MCH: 27.2 pg (ref 26.0–34.0)
MCHC: 31 g/dL (ref 30.0–36.0)
MCV: 87.9 fL (ref 80.0–100.0)
Monocytes Absolute: 1.5 10*3/uL — ABNORMAL HIGH (ref 0.1–1.0)
Monocytes Relative: 8 %
Neutro Abs: 16.6 10*3/uL — ABNORMAL HIGH (ref 1.7–7.7)
Neutrophils Relative %: 86 %
Platelets: 396 10*3/uL (ref 150–400)
RBC: 5.14 MIL/uL (ref 4.22–5.81)
RDW: 19 % — ABNORMAL HIGH (ref 11.5–15.5)
WBC: 19.1 10*3/uL — ABNORMAL HIGH (ref 4.0–10.5)
nRBC: 0 % (ref 0.0–0.2)

## 2023-11-20 LAB — BASIC METABOLIC PANEL WITH GFR
Anion gap: 12 (ref 5–15)
BUN: 59 mg/dL — ABNORMAL HIGH (ref 8–23)
CO2: 27 mmol/L (ref 22–32)
Calcium: 8.3 mg/dL — ABNORMAL LOW (ref 8.9–10.3)
Chloride: 101 mmol/L (ref 98–111)
Creatinine, Ser: 1.46 mg/dL — ABNORMAL HIGH (ref 0.61–1.24)
GFR, Estimated: 46 mL/min — ABNORMAL LOW (ref >60)
Glucose, Bld: 124 mg/dL — ABNORMAL HIGH (ref 70–99)
Potassium: 3.9 mmol/L (ref 3.5–5.1)
Sodium: 140 mmol/L (ref 135–145)

## 2023-11-20 LAB — HEPATIC FUNCTION PANEL
ALT: 70 U/L — ABNORMAL HIGH (ref 0–44)
AST: 93 U/L — ABNORMAL HIGH (ref 15–41)
Albumin: 2.1 g/dL — ABNORMAL LOW (ref 3.5–5.0)
Alkaline Phosphatase: 151 U/L — ABNORMAL HIGH (ref 38–126)
Bilirubin, Direct: 0.4 mg/dL — ABNORMAL HIGH (ref 0.0–0.2)
Indirect Bilirubin: 0.4 mg/dL (ref 0.3–0.9)
Total Bilirubin: 0.8 mg/dL (ref 0.0–1.2)
Total Protein: 6.5 g/dL (ref 6.5–8.1)

## 2023-11-20 LAB — URINE CULTURE: Culture: >=100000 — AB

## 2023-11-20 LAB — CULTURE, RESPIRATORY W GRAM STAIN
Culture: NORMAL
Gram Stain: NONE SEEN
Special Requests: NORMAL

## 2023-11-20 LAB — T3: T3, Total: 70 ng/dL — ABNORMAL LOW (ref 71–180)

## 2023-11-20 LAB — MAGNESIUM: Magnesium: 2.6 mg/dL — ABNORMAL HIGH (ref 1.7–2.4)

## 2023-11-20 MED ORDER — DABIGATRAN ETEXILATE MESYLATE 150 MG PO CAPS
150.0000 mg | ORAL_CAPSULE | Freq: Two times a day (BID) | ORAL | Status: DC
  Administered 2023-11-21 – 2023-11-27 (×13): 150 mg via ORAL
  Filled 2023-11-20 (×14): qty 1

## 2023-11-20 NOTE — Progress Notes (Signed)
 Pt desatted to 81% despite increasing HFNC from 7L to 15L. Pt placed on bipap with previous settings 14/8 30%. Vernona Rieger Gleason, PA notified. BS diminished and clear on R side, fine crackles L side posteriorly. Pt resting comfortably, no resp distress or increased WOB. Spo2 92% at this time.

## 2023-11-20 NOTE — Telephone Encounter (Signed)
 I called the pt to help him with his monitor but no answer. I left a detailed message on his voicemail.  I also reached out to [employer]. They will try to call him three times to help him with his home remote monitor.

## 2023-11-20 NOTE — Assessment & Plan Note (Signed)
-   urine culture noted with ecoli esbl but UA with 0-5 WBC and patient asymptomatic - I would favor not treating this organism as doesn't appear infectious

## 2023-11-20 NOTE — Progress Notes (Signed)
 Progress Note    Justin [name]   WUJ:[id number]  DOB: [birth year]  DOA: 11/17/2023     2 PCP: Mast, Man X, NP  Initial CC: SOB  Hospital Course: Mr. Bagshaw is an 89 yo male with PMH BPH, CAD s/p CABG, CHF, HTN, HLD, DMII, AS s/p TAVR, PAF, hypothyroidism who presented with shortness of breath.  He was recently seen by primary care and also given abx due to symptoms along with prednisone.  He required BIPAP on admission and became progressively more hypotensive also.  CXR showed asymmetric interstitial opacities concerning for atypical pneumonia versus pulmonary edema or both. RVP swab positive for coronavirus OC 43.  He also became more hypoxic requiring salter high flow. WBC elevated at 29.7 on admission and he was also started on antibiotics. Other notable labs included elevated BNP, 880 and he was given lasix as well.   Interval History:  Underwent thoracentesis yesterday afternoon and oxygen requirements did decrease afterwards however going back up again today.  Otherwise he is comfortable with essentially no concerns or complaints.  Just remains on high levels of oxygen.  Assessment and Plan: * Acute respiratory failure with hypoxia (HCC) - due to viral pna (see below) - requiring HF; not on O2 at baseline at home; wean as able - steroids added 3/25  Pleural effusion - moderate left pleural effusion since admission; underwent thoracentesis with PCCM on 3/26 - follow fluid culture  Coronavirus infection - RVP positive for coronavirus OC43 (non-covid) - CXR looks like diffuse GGO and he was very hypoxic and hypotensive on admission -CT chest obtained on 11/19/2023 showing significant involvement of right lung, sparing left lung for the most part but does have moderate-sized left pleural effusion - other considered differential per pulm was possible amio toxicity but I agree, would expect a bilateral affect rather than unilateral as being seen on CT chest  - continue  nebs - added steroids given profound hypoxia  - continue droplet precautions  -Diuresing intermittently with Lasix  Hypothyroidism - Continue Synthroid -TSH suppressed, 0.169; was normal 10/14/23 but elevated 06/30/23. Also had mildly elevated FT4 on 06/30/23 at 1.21 but now more elevated on admission at 1.68 (dose has not changed on looking at fill history) - will decrease from 200 mcg daily to 175 mcg daily (correlates more with weight based dose too) and needs repeat TSH and FT4 in about 4-6 weeks  Asymptomatic bacteriuria - urine culture noted with ecoli esbl but UA with 0-5 WBC and patient asymptomatic - I would favor not treating this organism as doesn't appear infectious  Type 2 diabetes mellitus (HCC) - A1c 6.8 % on 10/14/23 - continue on SSI and CBGs  MDD (major depressive disorder) - Continue Effexor  Chronic kidney disease, stage 3a (HCC) - patient has history of CKD3a. Baseline creat ~ 1.3 - 1.4, eGFR~ 50 - at baseline   Paroxysmal atrial fibrillation (HCC) - Continue Pradaxa and amiodarone  Gastroesophageal reflux disease - Continue Protonix  Essential hypertension - Significant hypotension on admission borderline requiring pressors; antihypertensives on hold  Hyperlipidemia - Continue Lipitor   Old records reviewed in assessment of this patient  Antimicrobials: Rocephin 11/18/2023 >> current Doxycycline 11/18/2023 >> current  DVT prophylaxis:  SCDs Start: 11/17/23 1536   Code Status:   Code Status: Limited: Do not attempt resuscitation (DNR) -DNR-LIMITED -Do Not Intubate/DNI   Mobility Assessment (Last 72 Hours)     Mobility Assessment   No documentation.  Barriers to discharge: None Disposition Plan: Home HH orders placed:  Status is: Inpatient  Objective: Blood pressure (!) 152/84, pulse (!) 115, temperature (!) 97.2 F (36.2 C), temperature source Axillary, resp. rate (!) 28, height 5' 6.5" (1.689 m), weight 111.6 kg, SpO2 91%.   Examination:  Physical Exam Constitutional:      General: He is not in acute distress.    Appearance: Normal appearance.  HENT:     Head: Normocephalic and atraumatic.     Mouth/Throat:     Mouth: Mucous membranes are moist.  Eyes:     Extraocular Movements: Extraocular movements intact.  Cardiovascular:     Rate and Rhythm: Normal rate and regular rhythm.  Pulmonary:     Effort: Pulmonary effort is normal. No respiratory distress.     Breath sounds: Rhonchi present. No wheezing.  Abdominal:     General: Bowel sounds are normal. There is no distension.     Palpations: Abdomen is soft.     Tenderness: There is no abdominal tenderness.  Musculoskeletal:        General: Normal range of motion.     Cervical back: Normal range of motion and neck supple.  Skin:    General: Skin is warm and dry.  Neurological:     General: No focal deficit present.     Mental Status: He is alert.  Psychiatric:        Mood and Affect: Mood normal.        Behavior: Behavior normal.      Consultants:  Pulmonology  Procedures:  11/19/2023: Left thoracentesis  Data Reviewed: Results for orders placed or performed during the hospital encounter of 11/17/23 (from the past 24 hours)  T3     Status: Abnormal   Collection Time: 11/19/23  1:02 PM  Result Value Ref Range   T3, Total 70 (L) 71 - 180 ng/dL  T4, free     Status: Abnormal   Collection Time: 11/19/23  1:02 PM  Result Value Ref Range   Free T4 1.68 (H) 0.61 - 1.12 ng/dL  Glucose, capillary     Status: Abnormal   Collection Time: 11/19/23  4:24 PM  Result Value Ref Range   Glucose-Capillary 148 (H) 70 - 99 mg/dL  Lactate dehydrogenase (pleural or peritoneal fluid)     Status: Abnormal   Collection Time: 11/19/23  5:47 PM  Result Value Ref Range   LD, Fluid 182 (H) 3 - 23 U/L   Fluid Type-FLDH PLEURAL   Body fluid culture w Gram Stain     Status: None (Preliminary result)   Collection Time: 11/19/23  5:47 PM   Specimen: Pleural  Fluid  Result Value Ref Range   Specimen Description      PLEURAL LEFT LUNG Performed at [facility], [address] [place], [place] [address]    Special Requests      NONE Performed at [facility], [address], [place], [place] [address]    Gram Stain      RARE WBC PRESENT, PREDOMINANTLY MONONUCLEAR NO ORGANISMS SEEN    Culture      NO GROWTH < 24 HOURS Performed at [facility], [address], [place], [place] [address]    Report Status PENDING   Protein, pleural or peritoneal fluid     Status: None   Collection Time: 11/19/23  5:47 PM  Result Value Ref Range   Total protein, fluid 3.2 g/dL   Fluid Type-FTP  PLEURAL   Glucose, pleural or peritoneal fluid     Status: None   Collection Time: 11/19/23  5:47 PM  Result Value Ref Range   Glucose, Fluid 130 mg/dL   Fluid Type-FGLU PLEURAL   Body fluid cell count with differential     Status: Abnormal   Collection Time: 11/19/23  5:47 PM  Result Value Ref Range   Fluid Type-FCT PLEURAL    Color, Fluid RED    Appearance, Fluid HAZY (A) CLEAR   Total Nucleated Cell Count, Fluid 946 0 - 1,000 cu mm   Neutrophil Count, Fluid 22 0 - 25 %   Lymphs, Fluid 59 %   Monocyte-Macrophage-Serous Fluid 19 (L) 50 - 90 %   Other Cells, Fluid OTHER CELLS IDENTIFIED AS MESOTHELIAL CELLS %  Lactate dehydrogenase     Status: Abnormal   Collection Time: 11/19/23  6:44 PM  Result Value Ref Range   LDH 344 (H) 98 - 192 U/L  Protein, total     Status: None   Collection Time: 11/19/23  6:44 PM  Result Value Ref Range   Total Protein 7.3 6.5 - 8.1 g/dL  Glucose, capillary     Status: Abnormal   Collection Time: 11/19/23  9:37 PM  Result Value Ref Range   Glucose-Capillary 215 (H) 70 - 99 mg/dL  Basic metabolic panel     Status: Abnormal   Collection Time: 11/20/23  3:14 AM  Result Value Ref Range   Sodium 140 135 - 145 mmol/L   Potassium 3.9 3.5 - 5.1 mmol/L   Chloride 101 98 - 111 mmol/L    CO2 27 22 - 32 mmol/L   Glucose, Bld 124 (H) 70 - 99 mg/dL   BUN 59 (H) 8 - 23 mg/dL   Creatinine, Ser 8.29 (H) 0.61 - 1.24 mg/dL   Calcium 8.3 (L) 8.9 - 10.3 mg/dL   GFR, Estimated 46 (L) >60 mL/min   Anion gap 12 5 - 15  CBC with Differential/Platelet     Status: Abnormal   Collection Time: 11/20/23  3:14 AM  Result Value Ref Range   WBC 19.1 (H) 4.0 - 10.5 K/uL   RBC 5.14 4.22 - 5.81 MIL/uL   Hemoglobin 14.0 13.0 - 17.0 g/dL   HCT 56.2 13.0 - 86.5 %   MCV 87.9 80.0 - 100.0 fL   MCH 27.2 26.0 - 34.0 pg   MCHC 31.0 30.0 - 36.0 g/dL   RDW 78.4 (H) 69.6 - 29.5 %   Platelets 396 150 - 400 K/uL   nRBC 0.0 0.0 - 0.2 %   Neutrophils Relative % 86 %   Neutro Abs 16.6 (H) 1.7 - 7.7 K/uL   Lymphocytes Relative 3 %   Lymphs Abs 0.6 (L) 0.7 - 4.0 K/uL   Monocytes Relative 8 %   Monocytes Absolute 1.5 (H) 0.1 - 1.0 K/uL   Eosinophils Relative 2 %   Eosinophils Absolute 0.3 0.0 - 0.5 K/uL   Basophils Relative 0 %   Basophils Absolute 0.0 0.0 - 0.1 K/uL   Immature Granulocytes 1 %   Abs Immature Granulocytes 0.13 (H) 0.00 - 0.07 K/uL  Magnesium     Status: Abnormal   Collection Time: 11/20/23  3:14 AM  Result Value Ref Range   Magnesium 2.6 (H) 1.7 - 2.4 mg/dL  Glucose, capillary     Status: Abnormal   Collection Time: 11/20/23  7:36 AM  Result Value Ref Range   Glucose-Capillary 128 (H) 70 - 99 mg/dL  Glucose, capillary     Status: Abnormal   Collection Time: 11/20/23 11:33 AM  Result Value Ref Range   Glucose-Capillary 134 (H) 70 - 99 mg/dL   *Note: Due to a large number of results and/or encounters for the requested time period, some results have not been displayed. A complete set of results can be found in Results Review.    I have reviewed pertinent nursing notes, vitals, labs, and images as necessary. I have ordered labwork to follow up on as indicated.  I have reviewed the last notes from staff over past 24 hours. I have discussed patient's care plan and test results with  nursing staff, CM/SW, and other staff as appropriate.  Critical care time to evaluate and treat this patient was 55 minutes.  Independent of separate billable services  This patient is critically ill with the following life-threatening issues requiring my presence at the bedside: Hemodynamic instability requiring titration of medications Oxygenation/ventilation instability requiring frequent modifications of support Cardiac rhythm disturbances requiring evaluation and/or interventions Fluctuations in neurologic function requiring evaluation and/or interventions and/or fluid/volume titration    LOS: 2 days   Lewie Chamber, MD [facility] 11/20/2023, 12:31 PM

## 2023-11-20 NOTE — Consult Note (Addendum)
 Cardiology Consultation   Patient ID: BEN HABERMANN MRN: [id number]; DOB: Feb 10, 1934  Admit date: 11/17/2023 Date of Consult: 11/20/2023  PCP:  Mast, Man X, NP   [facility] Providers Cardiologist:  Verne Carrow, MD  Electrophysiologist:  Sherryl Manges, MD  {   Patient Profile:   CHALES PELISSIER is a 89 y.o. 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 Vassie Loll.  History of Present Illness:   Mr. Smalls is 89 YOM with  10/2005: CAD s/p 4V CABG (LIMA to LAD, SVG to Diagonal, SVG to OM, SVG to RCA) with last cath 08/2016 showing patent 4/4 bypass grafts,  12/2016: ECHO - mildly reduced LV systolic function, LVEF = 40 to 45% with severe aortic stenosis.   03/04/2017: TAVR with a 29 mm Edwards SAPIEN 3 valve from the right transfemoral approach.  Post TAVR developed A-fib and was placed on IV amiodarone with conversion to NSR and continued on p.o. amiodarone.  Serial echo showed persistent LVEF 40 to 45%.   03/19/17: Follow-up visit in heart care noted irregular rhythm with interventricular conduction delay and was felt by our EP team to represent 2:1 conduction of competing fast and slow pathways.   Cardiac monitor with sinus rhythm with periods of junctional rhythm, frequent PVCs, frequent PACs and runs of wide-complex tachycardia.  He also had LBBB and IVCD.  05/03/2017: ICD was implanted on (CRT-Medtronic).   04/2019 ECHO: LVEF 35 to 40%.  Diuretic regimen was titrated to include Lasix with metolazone twice per week.   03/2021: Device interrogation showed  persistent A-fib and he underwent OP DCCV.    08/2021: Follow-up echo showed LVEF 40 to 45%.   He converted back to A-fib and underwent repeat DCCV 10/2021 he had recurrent atrial flutter/2023 and was loaded with amiodarone and cardioverted again 01/2022.   Device Interrogations 09/2022: Persistent A-fib and amiodarone D/C.  However, Dr. Graciela Husbands felt PVCs were interfering with pacing on his device and he restarted amiodarone for PVC suppression. Last device interrogation did show increased thoracic impedance.    The pt says over the past couple months he has had increased DOE and fatigue, progressively worse of the past few days before admission.  Walking down hall at [facility] he had to stop and rest    Pt also notes decreased appetite   . Denies any orthopnea, PND, Chest pain, LE edema. He was recently switched to HFNC and notes significant improvement in SOB. He in an independent living facility where his medications are managed by his daughter and reports compliance. He report healthy diet. Denies any tobacco, ETOH or drug history.   Recently seen by PCP diagnosed and treated with OP ABX for bronchitis.  Pt presented to ED 11/17/23 for increased SOB, fatigue and hypoxia at home.ED exam found patient to be hypoxic in the 70s with CXR concerning for multilobular PNA with superimposed edema.  He was transferred to ICU secondary to increasing O2 requirements and marginal pressures.  Required BiPAP this admission and became progressively more hypertensive also. He was admitted for PNA.  Labs significant for ,  Cr 1.46 (baseline 1.3-1.5), Mg 2.6 WBC elevated 19.1.  Lactic acid elevated 2.6.  Procalcitonin WNL 0.23.  Positive for coronavirus OC43. TSH low 0.169, T3 low 70, T4 elevated 1.68. AST elevated 76, ALT elevated 45. BNP 880 > 752, HsTn negative x2, 14 >17 Chest CT: Extensive airspace disease in R lung compatible with PNA. Moderate L pleural effusion with compressive atelectasis in L l lower lobe. Aortic  Atherosclerosis. Severe calcification in abdominal aorta.  Left sided thoracentesis 11/19/23: 1000 cc bloody fluid CXR s/p thoracentesis: Cardiomegaly. Persistent patchy R lung airspace opacities.  Interval decrease in trace L pleural effusion.  Aortic atherosclerosis  ECHO (limited) this admission: LVEF 40-45%, LV mildly decreased function.  Abnormal wall motion adjacent to RV pacing lead.  RV function moderately reduced.  RV size moderately enlarged.  L/R atrial size severely dilated.  Large pleural effusion in the left lateral region.  Mild to moderate TVR.  Aortic valve prosthesis with normal structure and function.  EKG 11/18/23: Ventricular paced, HR 103, PVCs  Cardiology consulted to evaluate for  possible amiodarone toxicity.   Past Medical History:  Diagnosis Date   Age-related macular degeneration, dry, both eyes    Allergic    "24/7; 365 days/year; I'm allergic to pollens, dust, all southern grasses/trees, mold, mildue, cats, dogs" (01/07/2017)   Anal fissure    Asthma    sees Dr. Kendrick Fries    Benign prostatic hypertrophy    (sees Dr. Chester Holstein   CAD (coronary artery disease)    a. s/p CABG 2007. b. Cath 08/2016 - 4/4 patent grafts.   Carotid bruit    carotid u/s 10/10: 0.39% bilaterally   Chronic combined systolic and diastolic CHF (congestive heart failure) (HCC)    Complication of anesthesia 1980s   "w/anal cyst OR, he gave me a saddle block then put a narcotic in spinal cord; had a severe reaction to that" (01/07/2017)   Congestive heart failure (CHF) [facility])    ED (erectile dysfunction)    Family history of adverse reaction to anesthesia    "daughter wakes up during OR" (01/07/2017)   GERD (gastroesophageal reflux disease)    Gout    HTN (hypertension)    Hx of colonic polyps    (sees Dr. Marina Goodell)   Hyperlipidemia    Hypothyroidism    Moderate to severe aortic stenosis    a. s/p TAVR 02/2017.   Myocardial infarction [facility]) ~ 2000   Obesity    Osteoarthritis    "was in my  knees, hands" (01/07/2017 )   PAF (paroxysmal atrial fibrillation) (HCC)    a. documented post TAVR.   Precancerous skin lesion    (sees Dr. Terri Piedra)   Prostate cancer [facility]) dx'd ~ 2014   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   29 mm Edwards Sapien 3 transcatheter heart valve placed via percutaneous right transfemoral approach    Past Surgical History:  Procedure Laterality Date   ANUS SURGERY     "opened it back up cause it wouldn't heal; wound up w/a fissure" (01/07/2017)   BIV ICD INSERTION CRT-D N/A 05/02/2017   Procedure: BIV ICD INSERTION CRT-D;  Surgeon: Duke Salvia, MD;  Location: [facility] INVASIVE CV LAB;  Service: Cardiovascular;  Laterality: N/A;   CARDIAC CATHETERIZATION  10/29/2005   CARDIAC CATHETERIZATION N/A 08/27/2016   Procedure: Right/Left Heart Cath and Coronary/Graft Angiography;  Surgeon: Kathleene Hazel, MD;  Location: [facility] INVASIVE CV LAB;  Service: Cardiovascular;  Laterality:  N/A;   CARDIOVERSION N/A 04/24/2021   Procedure: CARDIOVERSION;  Surgeon: Pricilla Riffle, MD;  Location: [facility] ENDOSCOPY;  Service: Cardiovascular;  Laterality: N/A;   CARDIOVERSION N/A 11/08/2021   Procedure: CARDIOVERSION;  Surgeon: Chrystie Nose, MD;  Location: [facility] ENDOSCOPY;  Service: Cardiovascular;  Laterality: N/A;   CARDIOVERSION N/A 02/06/2022   Procedure: CARDIOVERSION;  Surgeon: Wendall Stade, MD;  Location: [facility] ENDOSCOPY;  Service: Cardiovascular;  Laterality: N/A;   CATARACT EXTRACTION W/ INTRAOCULAR LENS  IMPLANT, BILATERAL Bilateral    CATARACT EXTRACTION, BILATERAL  2012   COLONOSCOPY  06/30/2008   no repeats needed    COLONOSCOPY     had 3 or 4 in the past    CORONARY ARTERY BYPASS GRAFT  2007   "CABG X4"   CYST EXCISION PERINEAL  1980s   HAMMER TOE SURGERY Bilateral    ICD GENERATOR CHANGEOUT N/A 05/30/2023   Procedure: ICD GENERATOR CHANGEOUT;  Surgeon: Duke Salvia, MD;  Location: [facility] INVASIVE CV LAB;  Service: Cardiovascular;   Laterality: N/A;   JOINT REPLACEMENT     KNEE ARTHROPLASTY  07/30/2011   Procedure: COMPUTER ASSISTED TOTAL KNEE ARTHROPLASTY;  Surgeon: Cammy Copa;  Location: [facility] OR;  Service: Orthopedics;  Laterality: Left;  left total knee arthroplasty   MASTECTOMY SUBCUTANEOUS Bilateral    MULTIPLE TOOTH EXTRACTIONS     ORIF FINGER / THUMB FRACTURE Right ~ 1980   "repair of thumb injury"   PROSTATE BIOPSY     REPLACEMENT TOTAL KNEE BILATERAL Bilateral 2012   TEE WITHOUT CARDIOVERSION N/A 03/04/2017   Procedure: TRANSESOPHAGEAL ECHOCARDIOGRAM (TEE);  Surgeon: Kathleene Hazel, MD;  Location: [facility] OR;  Service: Open Heart Surgery;  Laterality: N/A;   TOTAL KNEE REVISION Right 05/18/2019   Procedure: RIGHT PATELLA REVISION/REMOVAL;  Surgeon: Cammy Copa, MD;  Location[facility] OR;  Service: Orthopedics;  Laterality: Right;   TRANSCATHETER AORTIC VALVE REPLACEMENT, TRANSFEMORAL N/A 03/04/2017   Procedure: TRANSCATHETER AORTIC VALVE REPLACEMENT, TRANSFEMORAL;  Surgeon: Kathleene Hazel, MD;  Location: [facility] OR;  Service: Open Heart Surgery;  Laterality: N/A;     Home Medications:  Prior to Admission medications   Medication Sig Start Date End Date Taking? Authorizing Provider  albuterol (VENTOLIN HFA) 108 (90 Base) MCG/ACT inhaler Inhale 2 puffs into the lungs every 4 (four) hours as needed for wheezing or shortness of breath. 05/06/23  Yes Nelwyn Salisbury, MD  allopurinol (ZYLOPRIM) 100 MG tablet Take 1 tablet (100 mg total) by mouth daily. 09/18/23  Yes Nelwyn Salisbury, MD  amiodarone (PACERONE) 200 MG tablet Take 2 tablets by mouth twice daily x 2 weeks then decrease to 2 tablets by mouth daily x 2 weeks 11/13/22  Yes Duke Salvia, MD  aspirin 81 MG tablet Take 1 tablet (81 mg total) by mouth daily. 05/19/19  Yes Magnant, Charles L, PA-C  atorvastatin (LIPITOR) 20 MG tablet TAKE ONE TABLET ONCE DAILY 10/29/23  Yes Kathleene Hazel, MD  cetirizine (ZYRTEC) 10 MG tablet Take 10 mg by mouth  2 (two) times daily.   Yes [provider]  colchicine 0.6 MG tablet Take 1 tablet (0.6 mg total) by mouth every 6 (six) hours as needed (gout). 09/18/23 09/17/24 Yes Nelwyn Salisbury, MD  Continuous Glucose Receiver (DEXCOM G6 RECEIVER) DEVI 1 Device by Does not apply route 3 (three) times daily. E11.69 10/23/23  Yes Mast, Man X, NP  Continuous Glucose Sensor (DEXCOM G6 SENSOR) MISC 1 Device by Does not apply route 3 (  three) times daily. E11.69 10/23/23  Yes Mast, Man X, NP  Continuous Glucose Transmitter (DEXCOM G6 TRANSMITTER) MISC 1 Device by Does not apply route 3 (three) times daily. E11.69 10/23/23  Yes Mast, Man X, NP  dabigatran (PRADAXA) 150 MG CAPS capsule TAKE (1) CAPSULE TWICE DAILY. 10/24/23  Yes Kathleene Hazel, MD  dapagliflozin propanediol (FARXIGA) 10 MG TABS tablet Take 10 mg by mouth daily before breakfast.   Yes [provider]  dextromethorphan-guaiFENesin (MUCINEX DM) 30-600 MG 12hr tablet Take 1 tablet by mouth 2 (two) times daily for 14 days. 11/13/23 11/27/23 Yes Medina-Vargas, Monina C, NP  doxycycline (VIBRA-TABS) 100 MG tablet Take 1 tablet (100 mg total) by mouth 2 (two) times daily for 7 days. 11/13/23 11/20/23 Yes Medina-Vargas, Monina C, NP  isosorbide mononitrate (IMDUR) 30 MG 24 hr tablet Take 1 tablet (30 mg total) by mouth daily. 09/02/23 12/01/23 Yes Kathleene Hazel, MD  levothyroxine (SYNTHROID) 200 MCG tablet Take 1 tablet (200 mcg total) by mouth daily. 12/25/22  Yes Nelwyn Salisbury, MD  losartan (COZAAR) 25 MG tablet Take 0.5 tablets (12.5 mg total) by mouth daily. 07/06/23 07/05/24 Yes Dorcas Carrow, MD  magic mouthwash SOLN Take 5 mLs by mouth as directed. Suspension contains equal amounts of Maalox Extra Strength, nystatin, and diphenhydramine. RX'ed by dentist   Yes [provider]  methocarbamol (ROBAXIN) 500 MG tablet Take 1 tablet (500 mg total) by mouth 4 (four) times daily as needed for muscle spasms. 07/07/23  Yes London Sheer, MD  metoCLOPramide (REGLAN) 10 MG tablet TAKE ONE TABLET BY MOUTH EVERY MORNING WITH BREAKFAST 08/29/23  Yes Nelwyn Salisbury, MD  metolazone (ZAROXOLYN) 2.5 MG tablet Take 1 tablet (2.5 mg total) by mouth once a week every Saturday 10/27/23  Yes Duke Salvia, MD  metoprolol succinate (TOPROL-XL) 50 MG 24 hr tablet Take 2 tablets (100 mg total) by mouth daily. Take with or immediately following a meal. 05/30/23  Yes Duke Salvia, MD  montelukast (SINGULAIR) 10 MG tablet TAKE ONE TABLET BY MOUTH ONCE DAILY IN THE EVENING 08/20/22  Yes Hunsucker, Lesia Sago, MD  Multiple Minerals-Vitamins (CAL MAG ZINC +D3 PO) Take 2 tablets by mouth in the morning. 500 mg / 7 mg / 25 mcg   Yes [provider]  Multiple Vitamins-Minerals (MULTI-VITAMIN GUMMIES) CHEW Chew 2 tablets by mouth See admin instructions. Chew 2 gummies by mouth in the morning   Yes [provider]  Multiple Vitamins-Minerals (PRESERVISION AREDS 2) CAPS Take 1 capsule by mouth in the morning and at bedtime.   Yes [provider]  omeprazole (PRILOSEC) 20 MG capsule TAKE ONE CAPSULE TWICE DAILY 08/01/23  Yes Nelwyn Salisbury, MD  pantoprazole (PROTONIX) 40 MG tablet Take 1 tablet (40 mg total) by mouth daily. 10/23/23  Yes Mast, Man X, NP  potassium chloride SA (KLOR-CON M) 20 MEQ tablet Take 1 tablet (20 mEq total) by mouth 2 (two) times daily. 02/18/23  Yes Graciella Freer, PA-C  silver sulfADIAZINE (SILVADENE) 1 % cream Apply 1 Application topically 2 (two) times daily. 05/14/23  Yes Nelwyn Salisbury, MD  spironolactone (ALDACTONE) 25 MG tablet Take 1 tablet (25 mg total) by mouth daily. 10/24/23  Yes Kathleene Hazel, MD  torsemide (DEMADEX) 20 MG tablet Take 2 tablets (40 mg total) every other day, alternating with 1 tablet (20 mg total) every other day. 02/18/23  Yes Graciella Freer, PA-C  triamcinolone cream (KENALOG) 0.1 % Apply  1 application  topically daily as needed for dry skin (affected areas).  04/15/17  Yes [provider]  valACYclovir (VALTREX) 500 MG tablet Take 500 mg by mouth 2 (two) times daily as needed (cold sores).   Yes [provider]  venlafaxine XR (EFFEXOR XR) 37.5 MG 24 hr capsule Take 1 capsule (37.5 mg total) by mouth daily with breakfast. 10/23/23  Yes Mast, Man X, NP  benzonatate (TESSALON) 200 MG capsule Take 1 capsule (200 mg total) by mouth 2 (two) times daily as needed for cough. 05/06/23   Nelwyn Salisbury, MD  ketotifen (ZADITOR) 0.025 % ophthalmic solution Place 1 drop into both eyes daily as needed (for itching).    [provider]    Inpatient Medications: Scheduled Meds:  amiodarone  200 mg Oral Daily   arformoterol  15 mcg Nebulization BID   aspirin EC  81 mg Oral Daily   atorvastatin  20 mg Oral Daily   budesonide (PULMICORT) nebulizer solution  0.25 mg Nebulization BID   Chlorhexidine Gluconate Cloth  6 each Topical Q2200   doxycycline  100 mg Oral Q12H   insulin aspart  0-15 Units Subcutaneous TID WC   insulin aspart  0-5 Units Subcutaneous QHS   levalbuterol  0.63 mg Nebulization TID   levothyroxine  175 mcg Oral QAC breakfast   methylPREDNISolone (SOLU-MEDROL) injection  125 mg Intravenous Daily   mouth rinse  15 mL Mouth Rinse 4 times per day   pantoprazole  40 mg Oral Daily   venlafaxine XR  37.5 mg Oral Q breakfast   Continuous Infusions:  cefTRIAXone (ROCEPHIN)  IV Stopped (11/19/23 2312)   PRN Meds: acetaminophen **OR** acetaminophen, ondansetron **OR** ondansetron (ZOFRAN) IV, mouth rinse  Allergies:    Allergies  Allergen Reactions   Peanut-Containing Drug Products Anaphylaxis   Sulfonamide Derivatives Anaphylaxis   Amlodipine Swelling and Other (See Comments)    Swelling in ankles   Eliquis [Apixaban] Other (See Comments)    Back/hip pain   Lisinopril Cough   Xarelto [Rivaroxaban] Other (See Comments)    Back/hip pain    Social History:   Social History   Socioeconomic History   Marital  status: Married    Spouse name: Not on file   Number of children: 3   Years of education: Not on file   Highest education level: Not on file  Occupational History   Occupation: Product/process development scientist    Comment: builds malls  Tobacco Use   Smoking status: Former    Current packs/day: 0.00    Average packs/day: 3.5 packs/day for 13.0 years (45.5 ttl pk-yrs)    Types: Cigarettes    Start date: 68    Quit date: 1963    Years since quitting: 62.2   Smokeless tobacco: Never   Tobacco comments:    Former smoker 04/19/21  Vaping Use   Vaping status: Never Used  Substance and Sexual Activity   Alcohol use: Not Currently    Alcohol/week: 7.0 standard drinks of alcohol    Types: 7 Cans of beer per week    Comment: 1 beer daily after 5pm (04/19/21)   Drug use: No   Sexual activity: Never  Other Topics Concern   Not on file  Social History Narrative   FH of CAD, Male 1st degree relative less than age 6.   Social Drivers of Health   Financial Resource Strain: Low Risk  (03/31/2023)   Overall Financial Resource Strain (CARDIA)    Difficulty of Paying  Living Expenses: Not very hard  Food Insecurity: No Food Insecurity (11/18/2023)   Hunger Vital Sign    Worried About Running Out of Food in the Last Year: Never true    Ran Out of Food in the Last Year: Never true  Transportation Needs: No Transportation Needs (11/18/2023)   PRAPARE - Administrator, Civil Service (Medical): No    Lack of Transportation (Non-Medical): No  Physical Activity: Sufficiently Active (03/31/2023)   Exercise Vital Sign    Days of Exercise per Week: 7 days    Minutes of Exercise per Session: 30 min  Stress: No Stress Concern Present (03/31/2023)   [employer] of Occupational Health - Occupational Stress Questionnaire    Feeling of Stress : Not at all  Social Connections: Socially Integrated (11/18/2023)   Social Connection and Isolation Panel [NHANES]    Frequency of Communication with Friends and  Family: Three times a week    Frequency of Social Gatherings with Friends and Family: Three times a week    Attends Religious Services: More than 4 times per year    Active Member of Clubs or Organizations: Yes    Attends Banker Meetings: More than 4 times per year    Marital Status: Married  Catering manager Violence: Not At Risk (11/17/2023)   Humiliation, Afraid, Rape, and Kick questionnaire    Fear of Current or Ex-Partner: No    Emotionally Abused: No    Physically Abused: No    Sexually Abused: No    Family History:  Family History  Problem Relation Age of Onset   Heart attack Father 90   Allergic rhinitis Father    Asthma Father    Heart failure Mother 6   Uterine cancer Mother    Breast cancer Mother    Colon cancer Neg Hx    Esophageal cancer Neg Hx      ROS:  Please see the history of present illness.  All other ROS reviewed and negative.     Physical Exam/Data:   Vitals:   11/20/23 1135 11/20/23 1141 11/20/23 1200 11/20/23 1359  BP:      Pulse: (!) 108 (!) 117 (!) 115 (!) 108  Resp: (!) 24 20 (!) 28 (!) 26  Temp:  (!) 97.2 F (36.2 C)    TempSrc:  Axillary    SpO2: 98% 94% 91% 98%  Weight:      Height:        Intake/Output Summary (Last 24 hours) at 11/20/2023 1415 Last data filed at 11/20/2023 0900 Gross per 24 hour  Intake 220 ml  Output 1050 ml  Net -830 ml      11/17/2023   10:44 PM 11/13/2023    1:47 PM 11/12/2023    1:21 PM  Last 3 Weights  Weight (lbs) 246 lb 0.5 oz 250 lb 6.4 oz 249 lb  Weight (kg) 111.6 kg 113.581 kg 112.946 kg     Body mass index is 39.12 kg/m.  General:  Sitting upright in chair, in no acute distress with HFNC; Sister is at bedside. HEENT: normal Neck: no JVD Vascular: No carotid bruits; Distal pulses 2+ bilaterally Cardiac:  normal S1, S2; RRR; no murmur Lungs:  diminished breath sounds Abd: soft, nontender, no hepatomegaly  Ext: trace edema Musculoskeletal:  No deformities, BUE and BLE strength  normal and equal Skin: warm and dry  Neuro:  CNs 2-12 intact, no focal abnormalities noted Psych:  Normal affect   EKG:  The  EKG was personally reviewed and demonstrates:  Ventricular paced, HR 103, PVCs Telemetry:  Telemetry was personally reviewed and demonstrates:  V paced, HR 90-100's, with frequent PVC's   Relevant CV Studies  ECHO 1. Left ventricular ejection fraction, by estimation, is 40 to 45%. The  left ventricle has mildly decreased function. Left ventricular diastolic  parameters are indeterminate. Abnormal wall motion adjacent to RV pacing  lead.   2. Right ventricular systolic function is moderately reduced. The right  ventricular size is moderately enlarged. There is normal pulmonary artery  systolic pressure.   3. Left atrial size was severely dilated.   4. Right atrial size was severely dilated.   5. Large pleural effusion in the left lateral region.   6. The mitral valve is grossly normal. No evidence of mitral valve  regurgitation. No evidence of mitral stenosis.   7. Tricuspid valve regurgitation is mild to moderate.   8. The aortic valve has been repaired/replaced. Aortic valve  regurgitation is not visualized. Echo findings are consistent with normal  structure and function of the aortic valve prosthesis. Aortic valve mean  gradient measures 5.0 mmHg.   Comparison(s): Prior images reviewed side by side. Pleural effusion is  new. Study not optimized to assess for pericardial constriction; if  concern for this CMR or heart cathterization can be considered.   Laboratory Data:  High Sensitivity Troponin:   Recent Labs  Lab 11/17/23 1211 11/17/23 1435  TROPONINIHS 14 17     Chemistry Recent Labs  Lab 11/18/23 1918 11/19/23 0506 11/20/23 0314  NA 137 137 140  K 3.8 3.8 3.9  CL 98 99 101  CO2 27 26 27   GLUCOSE 167* 146* 124*  BUN 54* 55* 59*  CREATININE 1.57* 1.52* 1.46*  CALCIUM 8.2* 8.0* 8.3*  MG 2.7* 2.7* 2.6*  GFRNONAA 42* 44* 46*  ANIONGAP  12 12 12     Recent Labs  Lab 11/17/23 1211 11/18/23 1918 11/19/23 1844 11/20/23 1317  PROT 6.9 6.7 7.3 6.5  ALBUMIN 2.2* 2.1*  --  2.1*  AST 53* 76*  --  93*  ALT 38 45*  --  70*  ALKPHOS 135* 147*  --  151*  BILITOT 1.7* 0.8  --  0.8   Lipids No results for input(s): "CHOL", "TRIG", "HDL", "LABVLDL", "LDLCALC", "CHOLHDL" in the last 168 hours.  Hematology Recent Labs  Lab 11/18/23 0949 11/19/23 0313 11/20/23 0314  WBC 25.1* 24.8* 19.1*  RBC 5.64 4.79 5.14  HGB 15.4 13.1 14.0  HCT 52.2* 42.7 45.2  MCV 92.6 89.1 87.9  MCH 27.3 27.3 27.2  MCHC 29.5* 30.7 31.0  RDW 19.9* 19.1* 19.0*  PLT 331 373 396   Thyroid  Recent Labs  Lab 11/18/23 1556 11/19/23 1302  TSH 0.169*  --   FREET4  --  1.68*    BNP Recent Labs  Lab 11/17/23 1223 11/17/23 2333  BNP 880.6* 752.7*    DDimer No results for input(s): "DDIMER" in the last 168 hours.   Radiology/Studies:  DG CHEST PORT 1 VIEW Result Date: 11/20/2023 CLINICAL DATA:  Hypoxia. EXAM: PORTABLE CHEST 1 VIEW COMPARISON:  November 19, 2023. FINDINGS: Stable cardiomegaly. Status post coronary artery bypass graft and transcatheter aortic valve repair. Left-sided defibrillator is unchanged. Minimal left basilar subsegmental atelectasis and effusion is noted. Stable mild diffuse right lung opacity is noted concerning for pneumonia. Bony thorax is unremarkable. IMPRESSION: Minimal left basilar subsegmental atelectasis and left pleural effusion. Stable mild right diffuse lung opacities as noted above. Electronically  Signed   By: Lupita Raider M.D.   On: 11/20/2023 12:00   DG CHEST PORT 1 VIEW Result Date: 11/19/2023 CLINICAL DATA:  [id number] S/P thoracentesis [id number] EXAM: PORTABLE CHEST 1 VIEW COMPARISON:  Chest x-ray 11/18/2023, CT chest 11/19/2023 FINDINGS: Left chest wall triple lead pacemaker defibrillator. The heart and mediastinal contours are unchanged. Atherosclerotic plaque. Aortic valve replacement. Persistent patchy right lung  airspace opacities. No pulmonary edema. Interval decrease in trace left pleural effusion. No definite right pleural effusion. No pneumothorax. No acute osseous abnormality.  Sternotomy wires are intact. IMPRESSION: 1. Persistent patchy right lung airspace opacities better evaluated on CT chest 11/19/2023. 2. Interval decrease in trace left pleural effusion. 3.  Aortic Atherosclerosis (ICD10-I70.0). Electronically Signed   By: Tish Frederickson M.D.   On: 11/19/2023 19:48   CT CHEST WO CONTRAST Result Date: 11/19/2023 CLINICAL DATA:  Pneumonia, complications suspected. EXAM: CT CHEST WITHOUT CONTRAST TECHNIQUE: Multidetector CT imaging of the chest was performed following the standard protocol without IV contrast. RADIATION DOSE REDUCTION: This exam was performed according to the departmental dose-optimization program which includes automated exposure control, adjustment of the mA and/or kV according to patient size and/or use of iterative reconstruction technique. COMPARISON:  Chest radiograph 11/18/2023 and cardiac CT 10/28/2016 FINDINGS: Cardiovascular: Left chest ICD. Patient has biventricular leads. Status post TAVR. Heart size is within normal limits. No significant pericardial effusion. Status post CABG. Atherosclerotic calcifications in thoracic aorta without aneurysm. Prominent atherosclerotic calcifications in the proximal abdominal aorta particularly near the origin of the celiac trunk and SMA. Again noted is a tortuous innominate artery without prominent calcifications involving the innominate artery and proximal right subclavian artery. Mediastinum/Nodes: Small mediastinal lymph nodes. No significant hilar lymphadenopathy but limited evaluation without intravascular contrast. No significant axillary lymph node enlargement. Lungs/Pleura: Moderate sized left pleural effusion with compressive atelectasis in left lower lobe. Trachea and mainstem bronchi are patent. Extensive airspace disease throughout the  right lung involving right upper lobe, right middle lobe and right lower lobe. Near complete collapse of the left lower lobe. Upper Abdomen: Images of the upper abdomen are unremarkable. Musculoskeletal: No acute bone abnormality. IMPRESSION: 1. Extensive airspace disease throughout the right lung compatible with pneumonia. 2. Moderate sized left pleural effusion with compressive atelectasis in the left lower lobe. 3. Aortic Atherosclerosis (ICD10-I70.0). Severe calcified plaque in the abdominal aorta at the origin of the visualized visceral arteries. Electronically Signed   By: Richarda Overlie M.D.   On: 11/19/2023 11:03   DG CHEST PORT 1 VIEW Result Date: 11/18/2023 CLINICAL DATA:  Hypoxia EXAM: PORTABLE CHEST 1 VIEW COMPARISON:  11/17/2023 FINDINGS: Left-sided implanted cardiac device. Stable cardiomegaly status post sternotomy, CABG, and TAVR. Aortic atherosclerosis. Airspace opacity throughout the right lung is unchanged. Improving aeration of the left lung base compared to prior. No appreciable pleural fluid collection. No pneumothorax. IMPRESSION: Airspace opacity throughout the right lung is unchanged. Improving aeration of the left lung base compared to prior. Electronically Signed   By: Duanne Guess D.O.   On: 11/18/2023 17:54   DG Abd 1 View Result Date: 11/18/2023 CLINICAL DATA:  Abdominal distension. EXAM: ABDOMEN - 1 VIEW COMPARISON:  Pelvic radiograph dated 02/27/2022. FINDINGS: Evaluation is limited due to body habitus. Moderate stool and air throughout the colon. No bowel dilatation or evidence of obstruction. Degenerative changes of the spine. No acute osseous pathology. IMPRESSION: Nonobstructive bowel gas pattern. Electronically Signed   By: Elgie Collard M.D.   On: 11/18/2023 15:14   ECHOCARDIOGRAM LIMITED  Result Date: 11/18/2023    ECHOCARDIOGRAM LIMITED REPORT   Patient Name:   NEVAEH CASILLAS Date of Exam: 11/18/2023 Medical Rec #:  [id number]        Height:       66.5 in Accession  #:    [id number]       Weight:       246.0 lb Date of Birth:  03-31-1934       BSA:          2.196 m Patient Age:    89 years         BP:           129/66 mmHg Patient Gender: M                HR:           101 bpm. Exam Location:  Inpatient Procedure: Limited Echo, Cardiac Doppler, Color Doppler and Intracardiac            Opacification Agent (Both Spectral and Color Flow Doppler were            utilized during procedure). Indications:    Acute Respiratory Distress  History:        Patient has prior history of Echocardiogram examinations, most                 recent 07/01/2023.  Sonographer:    Karma Ganja Referring Phys: [id number] RUTWIJ JOSHI  Sonographer Comments: Technically difficult study due to poor echo windows. IMPRESSIONS  1. Left ventricular ejection fraction, by estimation, is 40 to 45%. The left ventricle has mildly decreased function. Left ventricular diastolic parameters are indeterminate. Abnormal wall motion adjacent to RV pacing lead.  2. Right ventricular systolic function is moderately reduced. The right ventricular size is moderately enlarged. There is normal pulmonary artery systolic pressure.  3. Left atrial size was severely dilated.  4. Right atrial size was severely dilated.  5. Large pleural effusion in the left lateral region.  6. The mitral valve is grossly normal. No evidence of mitral valve regurgitation. No evidence of mitral stenosis.  7. Tricuspid valve regurgitation is mild to moderate.  8. The aortic valve has been repaired/replaced. Aortic valve regurgitation is not visualized. Echo findings are consistent with normal structure and function of the aortic valve prosthesis. Aortic valve mean gradient measures 5.0 mmHg. Comparison(s): Prior images reviewed side by side. Pleural effusion is new. Study not optimized to assess for pericardial constriction; if concern for this CMR or heart cathterization can be considered. FINDINGS  Left Ventricle: Respirophasic variation in the mid  septum. LV tissue Doppler imaging not performed. Left ventricular ejection fraction, by estimation, is 40 to 45%. The left ventricle has mildly decreased function. Definity contrast agent was given IV to delineate the left ventricular endocardial borders. The left ventricular internal cavity size was normal in size. There is no left ventricular hypertrophy. Left ventricular diastolic parameters are indeterminate.  LV Wall Scoring: The mid inferoseptal segment, apical septal segment, and apex are dyskinetic. Abnormal wall motion adjacent to RV pacing lead. Right Ventricle: The right ventricular size is moderately enlarged. Right ventricular systolic function is moderately reduced. There is normal pulmonary artery systolic pressure. The tricuspid regurgitant velocity is 2.84 m/s, and with an assumed right atrial pressure of 3 mmHg, the estimated right ventricular systolic pressure is 35.3 mmHg. Left Atrium: Left atrial size was severely dilated. Right Atrium: Right atrial size was severely dilated. Pericardium: Presence of epicardial fat layer. Mitral Valve:  The mitral valve is grossly normal. No evidence of mitral valve stenosis. Tricuspid Valve: The tricuspid valve is normal in structure. Tricuspid valve regurgitation is mild to moderate. No evidence of tricuspid stenosis. Aortic Valve: The aortic valve has been repaired/replaced. Aortic valve regurgitation is not visualized. Aortic valve mean gradient measures 5.0 mmHg. Aortic valve peak gradient measures 8.2 mmHg. Aortic valve area, by VTI measures 2.69 cm. There is a 29 mm Sapien prosthetic, stented (TAVR) valve present in the aortic position. Echo findings are consistent with normal structure and function of the aortic valve prosthesis. Aorta: The aortic root and ascending aorta are structurally normal, with no evidence of dilitation. Additional Comments: A is visualized in the right ventricle and right atrium. There is a large pleural effusion in the left  lateral region. Spectral Doppler performed. Color Doppler performed.  LEFT VENTRICLE PLAX 2D LVIDd:         4.40 cm LVIDs:         3.30 cm LV PW:         1.00 cm LV IVS:        1.00 cm LVOT diam:     2.70 cm LV SV:         62 LV SV Index:   28 LVOT Area:     5.73 cm  LV Volumes (MOD) LV vol d, MOD A2C: 111.0 ml LV vol d, MOD A4C: 69.5 ml LV vol s, MOD A2C: 35.2 ml LV vol s, MOD A4C: 41.4 ml LV SV MOD A2C:     75.8 ml LV SV MOD A4C:     69.5 ml LV SV MOD BP:      52.5 ml RIGHT VENTRICLE            IVC RV S prime:     5.33 cm/s  IVC diam: 2.60 cm LEFT ATRIUM            Index LA diam:      4.80 cm  2.19 cm/m LA Vol (A4C): 173.0 ml 78.78 ml/m  AORTIC VALVE AV Area (Vmax):    2.40 cm AV Area (Vmean):   2.23 cm AV Area (VTI):     2.69 cm AV Vmax:           143.00 cm/s AV Vmean:          97.700 cm/s AV VTI:            0.232 m AV Peak Grad:      8.2 mmHg AV Mean Grad:      5.0 mmHg LVOT Vmax:         60.00 cm/s LVOT Vmean:        38.100 cm/s LVOT VTI:          0.109 m LVOT/AV VTI ratio: 0.47  AORTA Ao Root diam: 3.70 cm TRICUSPID VALVE TR Peak grad:   32.3 mmHg TR Vmax:        284.00 cm/s  SHUNTS Systemic VTI:  0.11 m Systemic Diam: 2.70 cm Riley Lam MD Electronically signed by Riley Lam MD Signature Date/Time: 11/18/2023/2:34:51 PM    Final    DG Chest Port 1 View Result Date: 11/17/2023 CLINICAL DATA:  Cough.  Shortness of breath. EXAM: PORTABLE CHEST 1 VIEW COMPARISON:  10/21/2023. FINDINGS: Low lung volume. There are asymmetric moderately increased interstitial markings in the right lung when compared to the left lung. Note is made of left retrocardiac airspace opacity obscuring the left hemidiaphragm, descending thoracic aorta and blunting the  left lateral costophrenic angle, suggesting combination of left lung atelectasis and/or consolidation with pleural effusion. There is subtle blunting of right lateral costophrenic angle, concerning for trace right pleural effusion. No pneumothorax on  either side Moderately enlarged cardio-mediastinal silhouette, which is likely accentuated by low lung volume and AP technique. Prosthetic aortic valve noted. There are surgical staples along the heart border and sternotomy wires, status post CABG (coronary artery bypass graft). There is a left sided 3-lead pacemaker. No acute osseous abnormalities. The soft tissues are within normal limits. IMPRESSION: 1. Asymmetric moderately increased interstitial markings in the right lung when compared to the left lung, which may represent asymmetric pulmonary edema versus atypical pneumonia. 2. Left retrocardiac airspace opacity, which may represent combination of left lung atelectasis and/or consolidation with pleural effusion. 3. Trace right pleural effusion. Electronically Signed   By: Jules Schick M.D.   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     REmains on HFNC I will review with EP  Unusual to have CT findings limited to one side though effusion/compression may have masked L side signs    Can hold for a short time and this should not change his levels     Consider repeat CT at some point now that fluid gone     Will review with EP      Note device interrogation today    100% afib   73% V paced    increased thoracic impedance    Chronic HFmrEF \\ Pt with CRT ([employer]) Echo LVEF 40 to 45% on echo this admit  - He has been treated with IV lasix with 3.96 L out    Also s/p thoracentesis      GDMT therapy on hold due to hypotension   Pt had been on Toprol XL 100 mg, Losartan 25 mg, Spironolactone 25 mg,  Imdur 30 mg, Torsemide 20 mg.  Not a candidate to Jardiance due to UTI's   Permanent Afib  - 100% afib burden on device as above  Pt failed  cardioversion and amio in past   - Continue pradaxa. Currently on Amino 200 mg=  Keep on amiodarone for now    PVCs    As noted above   Has been on  CAD s/p CABG x 4 in 10/2005 (LIMA-LAD, SVG-D, SVG-OM, SVG-RCA) - Pt denies CP  - Continue ASA 81 mg, Lipitor 20 mg  AV dz    Pt s/p TAVR in 2018    Echo shows prosthesis functioning normally    CKD, S3a -  eGFR~ 46, Baseline Cr ~ 1.3 - 1.5, this am: 1.46  Liver   Mild elevation of tranaminses  Follow   Thyroid     TSH 0.16  Thyroid dose being adjusted     DM2 - A1C: 6.8 on 10/14/23     Risk Assessment/Risk Scores:  { New York Heart Association (NYHA) Functional Class NYHA Class III  CHA2DS2-VASc Score = 6  This indicates a 9.7% annual risk of stroke. The  patient's score is based upon: CHF History: 1 HTN History: 1 Diabetes History: 1 Stroke History: 0 Vascular Disease History: 1 Age Score: 2 Gender Score: 0   For questions or updates, please contact [facility] Please consult [email or web address] for contact info under    Signed, Basilio Cairo, PA-C  11/20/2023 2:15 PM   Pt seen and examined   I have amended note above by Lajuana Ripple to reflect my findings  Pt is an 89 yo with CAD, HFrEF, Afib, PVCs (on amiodarone) Admitted with few week hx of fatigue, SOB , FTT Found to have R sided infiltrates, L pleural effusion   + for coronavirus HE is being treated with ABX QUestion over possible  amio lung toxicity, though changes only noted on R  On exam Pt sitting in chair with HFNC   Says breathing is better but still short Lungs   Mild rales at bases Neck  Full Cardiac Irreg irreg  N oS3   II/VI systolic murmur  Abd  Obese  Ext with 1+ edema   See note above which I have amended  to reflect my findings Can hold amio for now     Consider CT now that he has had  thoracentesis Will review with EP  Dietrich Pates MD

## 2023-11-20 NOTE — Progress Notes (Signed)
 NAME:  Justin [name], MRN:  [id number], DOB:  [birth year], LOS: 2 ADMISSION DATE:  11/17/2023, CONSULTATION DATE:  11/18/23 REFERRING MD:  EDP, CHIEF COMPLAINT:  hypotension   History of Present Illness:  89 yo male presented to [facility]  early today after reporting increasing fatigue and hypoxia at home. Pt has been dealing with this for a few days, he states today his shortness of breath increased. He had previously been seen by his PCP for symptoms and was diagnosed with bronchitis. He was started on amoxicillin and without improvement changed to doxy and prednisone.   Upon presentation pt was found to be hypoxic in the 70's. Cxr c/w multilobar pna with possible superimposed edema. Wbc elevated as well as BNP.   Pt denies cp, dizziness, no fever chills at home, worsening exertional dyspnea.   At this time pt states he is comfortable but would prefer nasal cannula over NIV. No other complaints  Pertinent  Medical History  Cad s/p cabg H/o chf combined systolic/diastolic Htn Hyperlipidemia hypothyroidism Ao stenosis s/p TAVR Pafib bph  Significant Hospital Events: Including procedures, antibiotic start and stop dates in addition to other pertinent events   Admitted to [facility] 3/24 Transferred to ICU 3/25 2/2 increasing oxygen requirement and marginal pressure 3/26 improving, though still with significant O2 requirement, plan to obtain CT chest 3/27 ? Amio toxicity, cardiology consult O2 requirement improving   Interim History / Subjective:  Down to 7L [place] this morning, no acute events overnight, thoracentesis completed yesterday  Objective   Blood pressure (!) 150/89, pulse 96, temperature 97.9 F (36.6 C), temperature source Axillary, resp. rate (!) 31, height 5' 6.5" (1.689 m), weight 111.6 kg, SpO2 95%.    FiO2 (%):  [30 %-40 %] 30 %   Intake/Output Summary (Last 24 hours) at 11/20/2023 1191 Last data filed at 11/19/2023 2312 Gross per 24 hour  Intake 100 ml  Output 1750 ml   Net -1650 ml   Filed Weights   11/17/23 2244  Weight: 111.6 kg   General:  well nourished elderly M sitting up in bed eating breakfast  HEENT: MM pink/moist, HFNC in place Neuro: alert, moving all extremities to command and oriented to person and place  CV: s1s2 rrr, no m/r/g PULM:  diminished air entry bilaterally with scattered crackles RLL, no distress or tachypnea  GI: soft, bsx4 active  Extremities: warm/dry, no  Skin: no rashes or lesions  Labs: WBC improving 19.1 Creat 1.4 (near baseline) Mag 2.6  Lines/tubes/drains: Condom cath   I/O: UOP 1.7L yesterday Net -1.6L   Micro: Legionella>negative  Strep pneumo>negative Covid/flu>negative RVP + coronavirus OC43 BC>NGTD UC> 100+ ESBL E. coli  Resolved Hospital Problem list    Sepsis with marginal pressures (not req pressors)  Assessment & Plan:    Acute hypoxic resp failure Viral Pneumonia Possible UTI L Pleural Effusion  RVP + for Coronavirus  -continue HFNC to maintain sats >92% -low likelihood PE as was anti-coagulated on Pradaxa, -CT chest with extensive R airspace disease, unusual for viral pna to be unilateral, ? Atypical pattern of amiodarone toxicity.  Will ask cardiology to weight in regarding stopping amio in the setting of prior NSVT and Afib -continue Ceftriaxone and Doxycycline  -IS -continue pulmicort, brovana and xopanex -thoracentesis 3/26 consistent with exudative effusion, cytology is pending    ESBL E Coli UTI -treated one month ago, pt's sepsis physiology improving, ID pharmacy did not recommend treating  -contact precautions  Acute on chronic decompensated systolic/diastolic hf Atrial  Fibrillation  Hx TAVR and NSVT -BNP 752 and trop negative -repeat echo without worsening EF, remains 40-45% -has diuresed will with Lasix 40mg  daily and now net negative, appreciate cardiology consult  -hold bb, arb, imdur for now -continue po amio, Asa, statin,   Hypothyroidism -continue  Synthroid    Best Practice (right click and "Reselect all SmartList Selections" daily)   Per primary  Labs   CBC: Recent Labs  Lab 11/17/23 1211 11/17/23 2333 11/18/23 0949 11/19/23 0313 11/20/23 0314  WBC 29.7* 23.7* 25.1* 24.8* 19.1*  NEUTROABS 24.2*  --   --  22.6* 16.6*  HGB 12.8* 13.2 15.4 13.1 14.0  HCT 41.4 42.2 52.2* 42.7 45.2  MCV 87.7 88.3 92.6 89.1 87.9  PLT 347 356 331 373 396    Basic Metabolic Panel: Recent Labs  Lab 11/17/23 2333 11/18/23 0642 11/18/23 1918 11/19/23 0506 11/20/23 0314  NA 134* 136 137 137 140  K 4.5 5.0 3.8 3.8 3.9  CL 98 100 98 99 101  CO2 23 21* 27 26 27   GLUCOSE 189* 108* 167* 146* 124*  BUN 47* 46* 54* 55* 59*  CREATININE 1.56* 1.38* 1.57* 1.52* 1.46*  CALCIUM 8.2* 8.2* 8.2* 8.0* 8.3*  MG 2.4  --  2.7* 2.7* 2.6*   GFR: Estimated Creatinine Clearance: 42.1 mL/min (A) (by C-G formula based on SCr of 1.46 mg/dL (H)). Recent Labs  Lab 11/17/23 2055 11/17/23 2333 11/18/23 0949 11/18/23 1800 11/19/23 0313 11/20/23 0314  PROCALCITON  --  0.23  --   --   --   --   WBC  --  23.7* 25.1*  --  24.8* 19.1*  LATICACIDVEN 4.8* 4.8* 2.8* 2.6*  --   --     Liver Function Tests: Recent Labs  Lab 11/17/23 1211 11/18/23 1918 11/19/23 1844  AST 53* 76*  --   ALT 38 45*  --   ALKPHOS 135* 147*  --   BILITOT 1.7* 0.8  --   PROT 6.9 6.7 7.3  ALBUMIN 2.2* 2.1*  --    No results for input(s): "LIPASE", "AMYLASE" in the last 168 hours. No results for input(s): "AMMONIA" in the last 168 hours.  ABG    Component Value Date/Time   PHART 7.49 (H) 11/18/2023 1725   PCO2ART 35 11/18/2023 1725   PO2ART 72 (L) 11/18/2023 1725   HCO3 26.8 11/18/2023 1725   TCO2 25 11/08/2021 0858   O2SAT 97.8 11/18/2023 1725     Coagulation Profile: No results for input(s): "INR", "PROTIME" in the last 168 hours.  Cardiac Enzymes: No results for input(s): "CKTOTAL", "CKMB", "CKMBINDEX", "TROPONINI" in the last 168 hours.  HbA1C: Hgb A1c MFr  Bld  Date/Time Value Ref Range Status  10/14/2023 07:29 AM 6.8 (H) <5.7 % of total Hgb Final    Comment:    For someone without known diabetes, a hemoglobin A1c value of 6.5% or greater indicates that they may have  diabetes and this should be confirmed with a follow-up  test. . For someone with known diabetes, a value <7% indicates  that their diabetes is well controlled and a value  greater than or equal to 7% indicates suboptimal  control. A1c targets should be individualized based on  duration of diabetes, age, comorbid conditions, and  other considerations. . Currently, no consensus exists regarding use of hemoglobin A1c for diagnosis of diabetes for children. [name]   10/10/2022 11:43 AM 6.4 4.6 - 6.5 % Final    Comment:    Glycemic Control  Guidelines for People with Diabetes:Non Diabetic:  <6%Goal of Therapy: <7%Additional Action Suggested:  >8%     CBG: Recent Labs  Lab 11/19/23 0820 11/19/23 1203 11/19/23 1624 11/19/23 2137 11/20/23 0736  GLUCAP 118* 154* 148* 215* 128*    Review of Systems:   As per HPI  Past Medical History:  He,  has a past medical history of Age-related macular degeneration, dry, both eyes, Allergic, Anal fissure, Asthma, Benign prostatic hypertrophy, CAD (coronary artery disease), Carotid bruit, Chronic combined systolic and diastolic CHF (congestive heart failure) (HCC), Complication of anesthesia (1980s), Congestive heart failure (CHF) (HCC), ED (erectile dysfunction), Family history of adverse reaction to anesthesia, GERD (gastroesophageal reflux disease), Gout, HTN (hypertension), colonic polyps, Hyperlipidemia, Hypothyroidism, Moderate to severe aortic stenosis, Myocardial infarction (HCC) (~ 2000), Obesity, Osteoarthritis, PAF (paroxysmal atrial fibrillation) (HCC), Precancerous skin lesion, Prostate cancer (HCC) (dx'd ~ 2014), 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   Surgical History:    Past Surgical History:  Procedure Laterality Date   ANUS SURGERY     "opened it back up cause it wouldn't heal; wound up w/a fissure" (01/07/2017)   BIV ICD INSERTION CRT-D N/A 05/02/2017   Procedure: BIV ICD INSERTION CRT-D;  Surgeon: Duke Salvia, MD;  Location: [facility] INVASIVE CV LAB;  Service: Cardiovascular;  Laterality: N/A;   CARDIAC CATHETERIZATION  10/29/2005   CARDIAC CATHETERIZATION N/A 08/27/2016   Procedure: Right/Left Heart Cath and Coronary/Graft Angiography;  Surgeon: Kathleene Hazel, MD;  Location: [facility] INVASIVE CV LAB;  Service: Cardiovascular;  Laterality: N/A;   CARDIOVERSION N/A 04/24/2021   Procedure: CARDIOVERSION;  Surgeon: Pricilla Riffle, MD;  Location: [facility] ENDOSCOPY;  Service: Cardiovascular;  Laterality: N/A;   CARDIOVERSION N/A 11/08/2021   Procedure: CARDIOVERSION;  Surgeon: Chrystie Nose, MD;  Location: [facility] ENDOSCOPY;  Service: Cardiovascular;  Laterality: N/A;   CARDIOVERSION N/A 02/06/2022   Procedure: CARDIOVERSION;  Surgeon: Wendall Stade, MD;  Location: [facility] ENDOSCOPY;  Service: Cardiovascular;  Laterality: N/A;   CATARACT EXTRACTION W/ INTRAOCULAR LENS  IMPLANT, BILATERAL Bilateral    CATARACT EXTRACTION, BILATERAL  2012   COLONOSCOPY  06/30/2008   no repeats needed    COLONOSCOPY     had 3 or 4 in the past    CORONARY ARTERY BYPASS GRAFT  2007   "CABG X4"   CYST EXCISION PERINEAL  1980s   HAMMER TOE SURGERY Bilateral    ICD GENERATOR CHANGEOUT N/A 05/30/2023   Procedure: ICD GENERATOR CHANGEOUT;  Surgeon: Duke Salvia, MD;  Location: [facility] INVASIVE CV LAB;  Service: Cardiovascular;  Laterality: N/A;   JOINT REPLACEMENT     KNEE ARTHROPLASTY  07/30/2011   Procedure: COMPUTER ASSISTED TOTAL KNEE ARTHROPLASTY;  Surgeon: Cammy Copa;  Location: [facility] OR;  Service: Orthopedics;  Laterality: Left;  left total knee arthroplasty   MASTECTOMY SUBCUTANEOUS Bilateral    MULTIPLE TOOTH EXTRACTIONS     ORIF FINGER / THUMB FRACTURE Right ~ 1980   "repair of  thumb injury"   PROSTATE BIOPSY     REPLACEMENT TOTAL KNEE BILATERAL Bilateral 2012   TEE WITHOUT CARDIOVERSION N/A 03/04/2017   Procedure: TRANSESOPHAGEAL ECHOCARDIOGRAM (TEE);  Surgeon: Kathleene Hazel, MD;  Location: [facility] OR;  Service: Open Heart Surgery;  Laterality: N/A;   TOTAL KNEE REVISION Right 05/18/2019   Procedure: RIGHT PATELLA REVISION/REMOVAL;  Surgeon: Cammy Copa, MD;  Location: [facility] OR;  Service: Orthopedics;  Laterality: Right;   TRANSCATHETER AORTIC VALVE REPLACEMENT, TRANSFEMORAL N/A 03/04/2017  Procedure: TRANSCATHETER AORTIC VALVE REPLACEMENT, TRANSFEMORAL;  Surgeon: Kathleene Hazel, MD;  Location: [facility] OR;  Service: Open Heart Surgery;  Laterality: N/A;     Social History:   reports that he quit smoking about 62 years ago. His smoking use included cigarettes. He started smoking about 75 years ago. He has a 45.5 pack-year smoking history. He has never used smokeless tobacco. He reports that he does not currently use alcohol after a past usage of about 7.0 standard drinks of alcohol per week. He reports that he does not use drugs.   Family History:  His family history includes Allergic rhinitis in his father; Asthma in his father; Breast cancer in his mother; Heart attack (age of onset: 52) in his father; Heart failure (age of onset: 32) in his mother; Uterine cancer in his mother. There is no history of Colon cancer or Esophageal cancer.   Allergies Allergies  Allergen Reactions   Peanut-Containing Drug Products Anaphylaxis   Sulfonamide Derivatives Anaphylaxis   Amlodipine Swelling and Other (See Comments)    Swelling in ankles   Eliquis [Apixaban] Other (See Comments)    Back/hip pain   Lisinopril Cough   Xarelto [Rivaroxaban] Other (See Comments)    Back/hip pain     Home Medications  Prior to Admission medications   Medication Sig Start Date End Date Taking? Authorizing Provider  albuterol (VENTOLIN HFA) 108 (90 Base) MCG/ACT inhaler Inhale  2 puffs into the lungs every 4 (four) hours as needed for wheezing or shortness of breath. 05/06/23  Yes Nelwyn Salisbury, MD  allopurinol (ZYLOPRIM) 100 MG tablet Take 1 tablet (100 mg total) by mouth daily. 09/18/23  Yes Nelwyn Salisbury, MD  amiodarone (PACERONE) 200 MG tablet Take 2 tablets by mouth twice daily x 2 weeks then decrease to 2 tablets by mouth daily x 2 weeks 11/13/22  Yes Duke Salvia, MD  aspirin 81 MG tablet Take 1 tablet (81 mg total) by mouth daily. 05/19/19  Yes Magnant, Charles L, PA-C  atorvastatin (LIPITOR) 20 MG tablet TAKE ONE TABLET ONCE DAILY 10/29/23  Yes Kathleene Hazel, MD  cetirizine (ZYRTEC) 10 MG tablet Take 10 mg by mouth 2 (two) times daily.   Yes [provider]  colchicine 0.6 MG tablet Take 1 tablet (0.6 mg total) by mouth every 6 (six) hours as needed (gout). 09/18/23 09/17/24 Yes Nelwyn Salisbury, MD  Continuous Glucose Receiver (DEXCOM G6 RECEIVER) DEVI 1 Device by Does not apply route 3 (three) times daily. E11.69 10/23/23  Yes Mast, Man X, NP  Continuous Glucose Sensor (DEXCOM G6 SENSOR) MISC 1 Device by Does not apply route 3 (three) times daily. E11.69 10/23/23  Yes Mast, Man X, NP  Continuous Glucose Transmitter (DEXCOM G6 TRANSMITTER) MISC 1 Device by Does not apply route 3 (three) times daily. E11.69 10/23/23  Yes Mast, Man X, NP  dabigatran (PRADAXA) 150 MG CAPS capsule TAKE (1) CAPSULE TWICE DAILY. 10/24/23  Yes Kathleene Hazel, MD  dapagliflozin propanediol (FARXIGA) 10 MG TABS tablet Take 10 mg by mouth daily before breakfast.   Yes [provider]  dextromethorphan-guaiFENesin (MUCINEX DM) 30-600 MG 12hr tablet Take 1 tablet by mouth 2 (two) times daily for 14 days. 11/13/23 11/27/23 Yes Medina-Vargas, Monina C, NP  doxycycline (VIBRA-TABS) 100 MG tablet Take 1 tablet (100 mg total) by mouth 2 (two) times daily for 7 days. 11/13/23 11/20/23 Yes Medina-Vargas, Monina C, NP  isosorbide mononitrate (IMDUR) 30 MG 24 hr tablet Take 1  tablet (30 mg total) by mouth daily. 09/02/23 12/01/23 Yes Kathleene Hazel, MD  levothyroxine (SYNTHROID) 200 MCG tablet Take 1 tablet (200 mcg total) by mouth daily. 12/25/22  Yes Nelwyn Salisbury, MD  losartan (COZAAR) 25 MG tablet Take 0.5 tablets (12.5 mg total) by mouth daily. 07/06/23 07/05/24 Yes Dorcas Carrow, MD  magic mouthwash SOLN Take 5 mLs by mouth as directed. Suspension contains equal amounts of Maalox Extra Strength, nystatin, and diphenhydramine. RX'ed by dentist   Yes [provider]  methocarbamol (ROBAXIN) 500 MG tablet Take 1 tablet (500 mg total) by mouth 4 (four) times daily as needed for muscle spasms. 07/07/23  Yes London Sheer, MD  metoCLOPramide (REGLAN) 10 MG tablet TAKE ONE TABLET BY MOUTH EVERY MORNING WITH BREAKFAST 08/29/23  Yes Nelwyn Salisbury, MD  metolazone (ZAROXOLYN) 2.5 MG tablet Take 1 tablet (2.5 mg total) by mouth once a week every Saturday 10/27/23  Yes Duke Salvia, MD  metoprolol succinate (TOPROL-XL) 50 MG 24 hr tablet Take 2 tablets (100 mg total) by mouth daily. Take with or immediately following a meal. 05/30/23  Yes Duke Salvia, MD  montelukast (SINGULAIR) 10 MG tablet TAKE ONE TABLET BY MOUTH ONCE DAILY IN THE EVENING 08/20/22  Yes Hunsucker, Lesia Sago, MD  Multiple Minerals-Vitamins (CAL MAG ZINC +D3 PO) Take 2 tablets by mouth in the morning. 500 mg / 7 mg / 25 mcg   Yes [provider]  Multiple Vitamins-Minerals (MULTI-VITAMIN GUMMIES) CHEW Chew 2 tablets by mouth See admin instructions. Chew 2 gummies by mouth in the morning   Yes [provider]  Multiple Vitamins-Minerals (PRESERVISION AREDS 2) CAPS Take 1 capsule by mouth in the morning and at bedtime.   Yes [provider]  omeprazole (PRILOSEC) 20 MG capsule TAKE ONE CAPSULE TWICE DAILY 08/01/23  Yes Nelwyn Salisbury, MD  pantoprazole (PROTONIX) 40 MG tablet Take 1 tablet (40 mg total) by mouth daily. 10/23/23  Yes Mast, Man X, NP  potassium chloride SA  (KLOR-CON M) 20 MEQ tablet Take 1 tablet (20 mEq total) by mouth 2 (two) times daily. 02/18/23  Yes Graciella Freer, PA-C  predniSONE (DELTASONE) 20 MG tablet Take 1 tablet (20 mg total) by mouth daily with breakfast for 5 days. 11/13/23 11/18/23 Yes Medina-Vargas, Monina C, NP  silver sulfADIAZINE (SILVADENE) 1 % cream Apply 1 Application topically 2 (two) times daily. 05/14/23  Yes Nelwyn Salisbury, MD  spironolactone (ALDACTONE) 25 MG tablet Take 1 tablet (25 mg total) by mouth daily. 10/24/23  Yes Kathleene Hazel, MD  torsemide (DEMADEX) 20 MG tablet Take 2 tablets (40 mg total) every other day, alternating with 1 tablet (20 mg total) every other day. 02/18/23  Yes Graciella Freer, PA-C  triamcinolone cream (KENALOG) 0.1 % Apply 1 application  topically daily as needed for dry skin (affected areas). 04/15/17  Yes [provider]  valACYclovir (VALTREX) 500 MG tablet Take 500 mg by mouth 2 (two) times daily as needed (cold sores).   Yes [provider]  venlafaxine XR (EFFEXOR XR) 37.5 MG 24 hr capsule Take 1 capsule (37.5 mg total) by mouth daily with breakfast. 10/23/23  Yes Mast, Man X, NP  benzonatate (TESSALON) 200 MG capsule Take 1 capsule (200 mg total) by mouth 2 (two) times daily as needed for cough. 05/06/23   Nelwyn Salisbury, MD  ketotifen (ZADITOR) 0.025 % ophthalmic solution Place 1 drop into both eyes daily as needed (for itching).  [provider]     Critical care time:          Darcella Gasman Dainel Arcidiacono, PA-C [place] [facility] See Amion for pager If no response to pager , please call [phone] until 7pm After 7:00 pm call Elink  [phone]

## 2023-11-20 NOTE — Assessment & Plan Note (Addendum)
-   moderate left pleural effusion since admission; underwent thoracentesis with PCCM on 3/26 - follow fluid culture (remains negative); pathology negative too for malignant cells

## 2023-11-21 ENCOUNTER — Ambulatory Visit: Admitting: Adult Health

## 2023-11-21 DIAGNOSIS — B342 Coronavirus infection, unspecified: Secondary | ICD-10-CM | POA: Diagnosis not present

## 2023-11-21 DIAGNOSIS — E039 Hypothyroidism, unspecified: Secondary | ICD-10-CM | POA: Diagnosis not present

## 2023-11-21 DIAGNOSIS — J90 Pleural effusion, not elsewhere classified: Secondary | ICD-10-CM | POA: Diagnosis not present

## 2023-11-21 DIAGNOSIS — J9601 Acute respiratory failure with hypoxia: Secondary | ICD-10-CM | POA: Diagnosis not present

## 2023-11-21 LAB — CBC WITH DIFFERENTIAL/PLATELET
Abs Immature Granulocytes: 0.08 10*3/uL — ABNORMAL HIGH (ref 0.00–0.07)
Basophils Absolute: 0 10*3/uL (ref 0.0–0.1)
Basophils Relative: 0 %
Eosinophils Absolute: 0 10*3/uL (ref 0.0–0.5)
Eosinophils Relative: 0 %
HCT: 43.9 % (ref 39.0–52.0)
Hemoglobin: 13.7 g/dL (ref 13.0–17.0)
Immature Granulocytes: 1 %
Lymphocytes Relative: 3 %
Lymphs Abs: 0.5 10*3/uL — ABNORMAL LOW (ref 0.7–4.0)
MCH: 26.9 pg (ref 26.0–34.0)
MCHC: 31.2 g/dL (ref 30.0–36.0)
MCV: 86.1 fL (ref 80.0–100.0)
Monocytes Absolute: 1.2 10*3/uL — ABNORMAL HIGH (ref 0.1–1.0)
Monocytes Relative: 7 %
Neutro Abs: 15.2 10*3/uL — ABNORMAL HIGH (ref 1.7–7.7)
Neutrophils Relative %: 89 %
Platelets: 356 10*3/uL (ref 150–400)
RBC: 5.1 MIL/uL (ref 4.22–5.81)
RDW: 18.8 % — ABNORMAL HIGH (ref 11.5–15.5)
WBC: 17 10*3/uL — ABNORMAL HIGH (ref 4.0–10.5)
nRBC: 0 % (ref 0.0–0.2)

## 2023-11-21 LAB — GLUCOSE, CAPILLARY
Glucose-Capillary: 123 mg/dL — ABNORMAL HIGH (ref 70–99)
Glucose-Capillary: 109 mg/dL — ABNORMAL HIGH (ref 70–99)
Glucose-Capillary: 189 mg/dL — ABNORMAL HIGH (ref 70–99)
Glucose-Capillary: 187 mg/dL — ABNORMAL HIGH (ref 70–99)

## 2023-11-21 LAB — MAGNESIUM: Magnesium: 2.7 mg/dL — ABNORMAL HIGH (ref 1.7–2.4)

## 2023-11-21 LAB — BASIC METABOLIC PANEL WITH GFR
Anion gap: 13 (ref 5–15)
BUN: 57 mg/dL — ABNORMAL HIGH (ref 8–23)
CO2: 25 mmol/L (ref 22–32)
Calcium: 8 mg/dL — ABNORMAL LOW (ref 8.9–10.3)
Chloride: 99 mmol/L (ref 98–111)
Creatinine, Ser: 1.24 mg/dL (ref 0.61–1.24)
GFR, Estimated: 56 mL/min — ABNORMAL LOW (ref >60)
Glucose, Bld: 155 mg/dL — ABNORMAL HIGH (ref 70–99)
Potassium: 3.5 mmol/L (ref 3.5–5.1)
Sodium: 137 mmol/L (ref 135–145)

## 2023-11-21 LAB — CYTOLOGY - NON PAP

## 2023-11-21 LAB — MRSA NEXT GEN BY PCR, NASAL: MRSA by PCR Next Gen: NOT DETECTED

## 2023-11-21 MED ORDER — METOPROLOL SUCCINATE ER 25 MG PO TB24
25.0000 mg | ORAL_TABLET | Freq: Every day | ORAL | Status: DC
  Administered 2023-11-21 – 2023-11-24 (×4): 25 mg via ORAL
  Filled 2023-11-21 (×4): qty 1

## 2023-11-21 MED ORDER — ORAL CARE MOUTH RINSE: 15.0000 mL | OROMUCOSAL | Status: DC | PRN

## 2023-11-21 MED ORDER — AMIODARONE HCL 200 MG PO TABS
200.0000 mg | ORAL_TABLET | Freq: Every day | ORAL | Status: DC
  Administered 2023-11-21 – 2023-11-27 (×7): 200 mg via ORAL
  Filled 2023-11-21 (×7): qty 1

## 2023-11-21 NOTE — Progress Notes (Signed)
 NAME:  Justin [name], MRN:  [id number], DOB:  09/05/1933, LOS: 3 ADMISSION DATE:  11/17/2023, CONSULTATION DATE:  11/18/23 REFERRING MD:  EDP, CHIEF COMPLAINT:  hypotension   History of Present Illness:  89 yo male presented to [facility]  early today after reporting increasing fatigue and hypoxia at home. Pt has been dealing with this for a few days, he states today his shortness of breath increased. He had previously been seen by his PCP for symptoms and was diagnosed with bronchitis. He was started on amoxicillin and without improvement changed to doxy and prednisone.   Upon presentation pt was found to be hypoxic in the 70's. Cxr c/w multilobar pna with possible superimposed edema. Wbc elevated as well as BNP.   Pt denies cp, dizziness, no fever chills at home, worsening exertional dyspnea.   At this time pt states he is comfortable but would prefer nasal cannula over NIV. No other complaints  Pertinent  Medical History  Cad s/p cabg H/o chf combined systolic/diastolic Htn Hyperlipidemia hypothyroidism Ao stenosis s/p TAVR Pafib bph  Significant Hospital Events: Including procedures, antibiotic start and stop dates in addition to other pertinent events   3/24 Admitted to [facility]  3/25 Transferred to ICU 2/2 increasing oxygen requirement and marginal pressure 3/26 improving, though still with significant O2 requirement, left thoracentesis with bloody fluid removed. CT chest with extensive airspace disease of right lung comparable with PNA 3/27 ? Amio toxicity, cardiology consult O2 requirement improving  3/28 Oxygen requirement back up now requiring 40L HHFNC, reports non-productive cough    Micro: Legionella>negative  Strep pneumo>negative Covid/flu>negative RVP + coronavirus OC43 BC>NGTD UC> 100+ ESBL E. coli  Interim History / Subjective:  Seen sitting up in bed eating breakfast   Objective   Blood pressure 116/80, pulse (!) 103, temperature 97.6 F (36.4 C),  temperature source Oral, resp. rate 20, height 5' 6.5" (1.689 m), weight 108.8 kg, SpO2 95%.    FiO2 (%):  [30 %-55 %] 55 %   Intake/Output Summary (Last 24 hours) at 11/21/2023 7425 Last data filed at 11/21/2023 0600 Gross per 24 hour  Intake 368.19 ml  Output 1650 ml  Net -1281.81 ml   Filed Weights   11/17/23 2244 11/20/23 2242  Weight: 111.6 kg 108.8 kg   Physical Exam  General: Acute on chronically ill appearing elderly male lying in bed, in NAD HEENT: [place]/AT, MM pink/moist, PERRL,  Neuro: Alert and oriented x3, non-focal  CV: s1s2 regular rate and rhythm, no murmur, rubs, or gallops,  PULM:  Slightly diminished bilaterally no increased work of breathing, no added breath sounds  GI: soft, bowel sounds active in all 4 quadrants, non-tender, non-distended, tolerating oral diet Extremities: warm/dry, no edema  Skin: no rashes or lesions  Resolved Hospital Problem list   Sepsis with marginal pressures (not req pressors)  Assessment & Plan:   Acute hypoxic resp failure Viral Pneumonia -RVP + for Coronavirus  -CT chest with extensive R airspace disease, unusual for viral pna to be unilateral, ? Atypical pattern of amiodarone toxicity.  Will ask cardiology to weight in regarding stopping amio in the setting of prior NSVT and Afib L Pleural Effusion  -Sl/p thora 3/26 with bloody fluid removed  P: Continue HHFNC, wean as able  Remains on Ceftriaxone and Doxy  Head of bed elevated 30 degrees Follow intermittent chest x-ray and ABG.   Ensure adequate pulmonary hygiene  Follow cultures  Aspiration precautions  Monitor volume status Mobilize as able  Continue Pulmicort, Brovana, and Xopenex  High dose steroids, will discuss with attending timing to wean  ESBL E Coli UTI -treated one month ago, pt's sepsis physiology improving, ID pharmacy did not recommend treating  P: Contact precautions   Acute on chronic decompensated systolic/diastolic hf Atrial Fibrillation   Hx TAVR and NSVT -BNP 752 and trop negative -repeat echo without worsening EF, remains 40-45% P: Primary management per [facility]  Continuous telemetry  Amio on hold   Hypothyroidism P: Synthroid    Best Practice (right click and "Reselect all SmartList Selections" daily)   Per primary   Critical care time:    Mendi Constable D. Harris, NP-C [place] [facility] Personal contact information can be found on Amion  If no contact or response made please call [phone] 11/21/2023, 7:09 AM

## 2023-11-21 NOTE — Progress Notes (Signed)
 Progress Note    Justin [name]   UEA:[id number]  DOB: Jan 20, 1934  DOA: 11/17/2023     3 PCP: Mast, Man X, NP  Initial CC: SOB  Hospital Course: [name] is an 89 yo male with PMH BPH, CAD s/p CABG, CHF, HTN, HLD, DMII, AS s/p TAVR, PAF, hypothyroidism who presented with shortness of breath.  He was recently seen by primary care and also given abx due to symptoms along with prednisone.  He required BIPAP on admission and became progressively more hypotensive also.  CXR showed asymmetric interstitial opacities concerning for atypical pneumonia versus pulmonary edema or both. RVP swab positive for coronavirus OC 43.  He also became more hypoxic requiring salter high flow. WBC elevated at 29.7 on admission and he was also started on antibiotics. Other notable labs included elevated BNP, 880 and he was given lasix as well.   Interval History:  Although breathing comfortably and in no distress, he has now been escalated up to Optiflow.  Still having dry nonproductive cough.  Assessment and Plan: * Acute respiratory failure with hypoxia (HCC) - due to viral pna (see below) - requiring HF; not on O2 at baseline at home; wean as able - steroids added 3/25  Pleural effusion - moderate left pleural effusion since admission; underwent thoracentesis with PCCM on 3/26 - follow fluid culture (remains negative)  Coronavirus infection - RVP positive for coronavirus OC43 (non-covid) - CXR looks like diffuse GGO and he was very hypoxic and hypotensive on admission -CT chest obtained on 11/19/2023 showing significant involvement of right lung, sparing left lung for the most part but does have moderate-sized left pleural effusion - other considered differential per pulm was possible amio toxicity but I agree, would expect a bilateral effect rather than unilateral as being seen on CT chest  - continue nebs - added steroids given profound hypoxia  - continue droplet precautions  -Diuresing  intermittently with Lasix  Hypothyroidism - Continue Synthroid -TSH suppressed, 0.169; was normal 10/14/23 but elevated 06/30/23. Also had mildly elevated FT4 on 06/30/23 at 1.21 but now more elevated on admission at 1.68 (dose has not changed on looking at fill history) - will decrease from 200 mcg daily to 175 mcg daily (correlates more with weight based dose too) and needs repeat TSH and FT4 in about 4-6 weeks  Asymptomatic bacteriuria - urine culture noted with ecoli esbl but UA with 0-5 WBC and patient asymptomatic - I would favor not treating this organism as doesn't appear infectious  Type 2 diabetes mellitus (HCC) - A1c 6.8 % on 10/14/23 - continue on SSI and CBGs  MDD (major depressive disorder) - Continue Effexor  Chronic kidney disease, stage 3a (HCC) - patient has history of CKD3a. Baseline creat ~ 1.3 - 1.4, eGFR~ 50 - at baseline   Paroxysmal atrial fibrillation (HCC) - Continue Pradaxa and amiodarone  Gastroesophageal reflux disease - Continue Protonix  Essential hypertension - Significant hypotension on admission borderline requiring pressors; antihypertensives on hold  Hyperlipidemia - Continue Lipitor   Old records reviewed in assessment of this patient  Antimicrobials: Rocephin 11/18/2023 >> current Doxycycline 11/18/2023 >> current  DVT prophylaxis:  SCDs Start: 11/17/23 1536 dabigatran (PRADAXA) capsule 150 mg   Code Status:   Code Status: Limited: Do not attempt resuscitation (DNR) -DNR-LIMITED -Do Not Intubate/DNI   Mobility Assessment (Last 72 Hours)     Mobility Assessment   No documentation.           Barriers to discharge: None Disposition  Plan: Home HH orders placed:  Status is: Inpatient  Objective: Blood pressure 109/81, pulse (!) 107, temperature 97.6 F (36.4 C), temperature source Oral, resp. rate 20, height 5' 6.5" (1.689 m), weight 108.8 kg, SpO2 98%.  Examination:  Physical Exam Constitutional:      General: He is not  in acute distress.    Appearance: Normal appearance.  HENT:     Head: Normocephalic and atraumatic.     Mouth/Throat:     Mouth: Mucous membranes are moist.  Eyes:     Extraocular Movements: Extraocular movements intact.  Cardiovascular:     Rate and Rhythm: Normal rate and regular rhythm.  Pulmonary:     Effort: Pulmonary effort is normal. No respiratory distress.     Breath sounds: Rhonchi (Worse on right) present. No wheezing.  Abdominal:     General: Bowel sounds are normal. There is no distension.     Palpations: Abdomen is soft.     Tenderness: There is no abdominal tenderness.  Musculoskeletal:        General: Normal range of motion.     Cervical back: Normal range of motion and neck supple.     Right lower leg: No edema.     Left lower leg: No edema.  Skin:    General: Skin is warm and dry.  Neurological:     General: No focal deficit present.     Mental Status: He is alert.  Psychiatric:        Mood and Affect: Mood normal.        Behavior: Behavior normal.      Consultants:  Pulmonology Cardiology  Procedures:  11/19/2023: Left thoracentesis  Data Reviewed: Results for orders placed or performed during the hospital encounter of 11/17/23 (from the past 24 hours)  Hepatic function panel     Status: Abnormal   Collection Time: 11/20/23  1:17 PM  Result Value Ref Range   Total Protein 6.5 6.5 - 8.1 g/dL   Albumin 2.1 (L) 3.5 - 5.0 g/dL   AST 93 (H) 15 - 41 U/L   ALT 70 (H) 0 - 44 U/L   Alkaline Phosphatase 151 (H) 38 - 126 U/L   Total Bilirubin 0.8 0.0 - 1.2 mg/dL   Bilirubin, Direct 0.4 (H) 0.0 - 0.2 mg/dL   Indirect Bilirubin 0.4 0.3 - 0.9 mg/dL  Glucose, capillary     Status: Abnormal   Collection Time: 11/20/23  4:36 PM  Result Value Ref Range   Glucose-Capillary 147 (H) 70 - 99 mg/dL  Glucose, capillary     Status: Abnormal   Collection Time: 11/20/23 10:09 PM  Result Value Ref Range   Glucose-Capillary 185 (H) 70 - 99 mg/dL   Comment 1 Notify RN     Comment 2 Document in Chart   MRSA Next Gen by PCR, Nasal     Status: None   Collection Time: 11/20/23 11:10 PM   Specimen: Nasal Mucosa; Nasal Swab  Result Value Ref Range   MRSA by PCR Next Gen NOT DETECTED NOT DETECTED  Basic metabolic panel     Status: Abnormal   Collection Time: 11/21/23  3:01 AM  Result Value Ref Range   Sodium 137 135 - 145 mmol/L   Potassium 3.5 3.5 - 5.1 mmol/L   Chloride 99 98 - 111 mmol/L   CO2 25 22 - 32 mmol/L   Glucose, Bld 155 (H) 70 - 99 mg/dL   BUN 57 (H) 8 - 23 mg/dL  Creatinine, Ser 1.24 0.61 - 1.24 mg/dL   Calcium 8.0 (L) 8.9 - 10.3 mg/dL   GFR, Estimated 56 (L) >60 mL/min   Anion gap 13 5 - 15  CBC with Differential/Platelet     Status: Abnormal   Collection Time: 11/21/23  3:01 AM  Result Value Ref Range   WBC 17.0 (H) 4.0 - 10.5 K/uL   RBC 5.10 4.22 - 5.81 MIL/uL   Hemoglobin 13.7 13.0 - 17.0 g/dL   HCT 81.0 17.5 - 10.2 %   MCV 86.1 80.0 - 100.0 fL   MCH 26.9 26.0 - 34.0 pg   MCHC 31.2 30.0 - 36.0 g/dL   RDW 58.5 (H) 27.7 - 82.4 %   Platelets 356 150 - 400 K/uL   nRBC 0.0 0.0 - 0.2 %   Neutrophils Relative % 89 %   Neutro Abs 15.2 (H) 1.7 - 7.7 K/uL   Lymphocytes Relative 3 %   Lymphs Abs 0.5 (L) 0.7 - 4.0 K/uL   Monocytes Relative 7 %   Monocytes Absolute 1.2 (H) 0.1 - 1.0 K/uL   Eosinophils Relative 0 %   Eosinophils Absolute 0.0 0.0 - 0.5 K/uL   Basophils Relative 0 %   Basophils Absolute 0.0 0.0 - 0.1 K/uL   Immature Granulocytes 1 %   Abs Immature Granulocytes 0.08 (H) 0.00 - 0.07 K/uL  Magnesium     Status: Abnormal   Collection Time: 11/21/23  3:01 AM  Result Value Ref Range   Magnesium 2.7 (H) 1.7 - 2.4 mg/dL  Glucose, capillary     Status: Abnormal   Collection Time: 11/21/23  8:02 AM  Result Value Ref Range   Glucose-Capillary 123 (H) 70 - 99 mg/dL   Comment 1 Notify RN    Comment 2 Document in Chart   Glucose, capillary     Status: Abnormal   Collection Time: 11/21/23 11:51 AM  Result Value Ref Range    Glucose-Capillary 109 (H) 70 - 99 mg/dL   Comment 1 Notify RN    Comment 2 Document in Chart    *Note: Due to a large number of results and/or encounters for the requested time period, some results have not been displayed. A complete set of results can be found in Results Review.    I have reviewed pertinent nursing notes, vitals, labs, and images as necessary. I have ordered labwork to follow up on as indicated.  I have reviewed the last notes from staff over past 24 hours. I have discussed patient's care plan and test results with nursing staff, CM/SW, and other staff as appropriate.  Critical care time to evaluate and treat this patient was 55 minutes.  Independent of separate billable services  This patient is critically ill with the following life-threatening issues requiring my presence at the bedside: Hemodynamic instability requiring titration of medications Oxygenation/ventilation instability requiring frequent modifications of support Cardiac rhythm disturbances requiring evaluation and/or interventions Fluctuations in neurologic function requiring evaluation and/or interventions and/or fluid/volume titration    LOS: 3 days   Lewie Chamber, MD [facility] 11/21/2023, 12:54 PM

## 2023-11-21 NOTE — Plan of Care (Signed)
  Problem: Education: Goal: Knowledge of General Education information will improve Description: Including pain rating scale, medication(s)/side effects and non-pharmacologic comfort measures Outcome: Progressing   Problem: Health Behavior/Discharge Planning: Goal: Ability to manage health-related needs will improve Outcome: Progressing   Problem: Clinical Measurements: Goal: Ability to maintain clinical measurements within normal limits will improve Outcome: Progressing Goal: Will remain free from infection Outcome: Progressing Goal: Diagnostic test results will improve Outcome: Progressing Goal: Respiratory complications will improve Outcome: Progressing Goal: Cardiovascular complication will be avoided Outcome: Progressing   Problem: Activity: Goal: Risk for activity intolerance will decrease Outcome: Progressing   Problem: Nutrition: Goal: Adequate nutrition will be maintained Outcome: Progressing   Problem: Coping: Goal: Level of anxiety will decrease Outcome: Progressing   Problem: Elimination: Goal: Will not experience complications related to bowel motility Outcome: Progressing Goal: Will not experience complications related to urinary retention Outcome: Progressing   Problem: Pain Managment: Goal: General experience of comfort will improve and/or be controlled Outcome: Progressing   Problem: Safety: Goal: Ability to remain free from injury will improve Outcome: Progressing   Problem: Skin Integrity: Goal: Risk for impaired skin integrity will decrease Outcome: Progressing   Problem: Education: Goal: Ability to describe self-care measures that may prevent or decrease complications (Diabetes Survival Skills Education) will improve Outcome: Progressing   Problem: Coping: Goal: Ability to adjust to condition or change in health will improve Outcome: Progressing   Problem: Fluid Volume: Goal: Ability to maintain a balanced intake and output will  improve Outcome: Progressing   Problem: Health Behavior/Discharge Planning: Goal: Ability to identify and utilize available resources and services will improve Outcome: Progressing Goal: Ability to manage health-related needs will improve Outcome: Progressing   Problem: Metabolic: Goal: Ability to maintain appropriate glucose levels will improve Outcome: Progressing   Problem: Nutritional: Goal: Maintenance of adequate nutrition will improve Outcome: Progressing Goal: Progress toward achieving an optimal weight will improve Outcome: Progressing   Problem: Skin Integrity: Goal: Risk for impaired skin integrity will decrease Outcome: Progressing   Problem: Tissue Perfusion: Goal: Adequacy of tissue perfusion will improve Outcome: Progressing   Cindy S. Clelia Croft BSN, RN, [employer], CCRN 11/21/2023 2:51 AM

## 2023-11-21 NOTE — Progress Notes (Addendum)
 Rounding Note    Patient Name: Justin [name] Date of Encounter: 11/21/2023  [facility] Cardiologist: Verne Carrow, MD  Subjective   Reports doing better this am. Notes improvement in breathing. Able to sleep throughout the night without any complaints. Denies CP, dizziness.   Inpatient Medications    Scheduled Meds:  arformoterol  15 mcg Nebulization BID   aspirin EC  81 mg Oral Daily   atorvastatin  20 mg Oral Daily   budesonide (PULMICORT) nebulizer solution  0.25 mg Nebulization BID   Chlorhexidine Gluconate Cloth  6 each Topical Q2200   dabigatran  150 mg Oral BID   doxycycline  100 mg Oral Q12H   insulin aspart  0-15 Units Subcutaneous TID WC   insulin aspart  0-5 Units Subcutaneous QHS   levalbuterol  0.63 mg Nebulization TID   levothyroxine  175 mcg Oral QAC breakfast   methylPREDNISolone (SOLU-MEDROL) injection  125 mg Intravenous Daily   pantoprazole  40 mg Oral Daily   venlafaxine XR  37.5 mg Oral Q breakfast   Continuous Infusions:  cefTRIAXone (ROCEPHIN)  IV Stopped (11/20/23 2230)   PRN Meds: acetaminophen **OR** acetaminophen, ondansetron **OR** ondansetron (ZOFRAN) IV, mouth rinse   Vital Signs    Vitals:   11/21/23 0400 11/21/23 0740 11/21/23 0744 11/21/23 0800  BP: 116/80   129/72  Pulse: (!) 103   (!) 108  Resp: 20   (!) 24  Temp:      TempSrc:      SpO2: 95% 94% 94% 96%  Weight:      Height:        Intake/Output Summary (Last 24 hours) at 11/21/2023 0843 Last data filed at 11/21/2023 0600 Gross per 24 hour  Intake 368.19 ml  Output 1300 ml  Net -931.81 ml      11/20/2023   10:42 PM 11/17/2023   10:44 PM 11/13/2023    1:47 PM  Last 3 Weights  Weight (lbs) 239 lb 13.8 oz 246 lb 0.5 oz 250 lb 6.4 oz  Weight (kg) 108.8 kg 111.6 kg 113.581 kg      Telemetry    V paced, Sinus Tachy, HR 100-110's, PVCs - Personally Reviewed  ECG    None new - Personally Reviewed  Physical Exam   GEN: Sitting upright in  Recliner with No acute distress.  HFNC  Neck: No JVD Cardiac: normal S1, S2; RRR; no murmur  Respiratory:  diminished breath sound but improved from yesterday GI: Soft, nontender, non-distended  MS: No edema; No deformity. Neuro:  Nonfocal  Psych: Normal affect   Labs    High Sensitivity Troponin:   Recent Labs  Lab 11/17/23 1211 11/17/23 1435  TROPONINIHS 14 17     Chemistry Recent Labs  Lab 11/17/23 1211 11/17/23 2333 11/18/23 1918 11/19/23 0506 11/19/23 1844 11/20/23 0314 11/20/23 1317 11/21/23 0301  NA 136   < > 137 137  --  140  --  137  K 4.3   < > 3.8 3.8  --  3.9  --  3.5  CL 99   < > 98 99  --  101  --  99  CO2 24   < > 27 26  --  27  --  25  GLUCOSE 127*   < > 167* 146*  --  124*  --  155*  BUN 41*   < > 54* 55*  --  59*  --  57*  CREATININE 1.42*   < >  1.57* 1.52*  --  1.46*  --  1.24  CALCIUM 8.3*   < > 8.2* 8.0*  --  8.3*  --  8.0*  MG  --    < > 2.7* 2.7*  --  2.6*  --  2.7*  PROT 6.9  --  6.7  --  7.3  --  6.5  --   ALBUMIN 2.2*  --  2.1*  --   --   --  2.1*  --   AST 53*  --  76*  --   --   --  93*  --   ALT 38  --  45*  --   --   --  70*  --   ALKPHOS 135*  --  147*  --   --   --  151*  --   BILITOT 1.7*  --  0.8  --   --   --  0.8  --   GFRNONAA 48*   < > 42* 44*  --  46*  --  56*  ANIONGAP 13   < > 12 12  --  12  --  13   < > = values in this interval not displayed.    Lipids No results for input(s): "CHOL", "TRIG", "HDL", "LABVLDL", "LDLCALC", "CHOLHDL" in the last 168 hours.  Hematology Recent Labs  Lab 11/19/23 0313 11/20/23 0314 11/21/23 0301  WBC 24.8* 19.1* 17.0*  RBC 4.79 5.14 5.10  HGB 13.1 14.0 13.7  HCT 42.7 45.2 43.9  MCV 89.1 87.9 86.1  MCH 27.3 27.2 26.9  MCHC 30.7 31.0 31.2  RDW 19.1* 19.0* 18.8*  PLT 373 396 356   Thyroid  Recent Labs  Lab 11/18/23 1556 11/19/23 1302  TSH 0.169*  --   FREET4  --  1.68*    BNP Recent Labs  Lab 11/17/23 1223 11/17/23 2333  BNP 880.6* 752.7*    DDimer No results for  input(s): "DDIMER" in the last 168 hours.   Radiology    DG CHEST PORT 1 VIEW Result Date: 11/20/2023 CLINICAL DATA:  Hypoxia. EXAM: PORTABLE CHEST 1 VIEW COMPARISON:  November 19, 2023. FINDINGS: Stable cardiomegaly. Status post coronary artery bypass graft and transcatheter aortic valve repair. Left-sided defibrillator is unchanged. Minimal left basilar subsegmental atelectasis and effusion is noted. Stable mild diffuse right lung opacity is noted concerning for pneumonia. Bony thorax is unremarkable. IMPRESSION: Minimal left basilar subsegmental atelectasis and left pleural effusion. Stable mild right diffuse lung opacities as noted above. Electronically Signed   By: Lupita Raider M.D.   On: 11/20/2023 12:00   DG CHEST PORT 1 VIEW Result Date: 11/19/2023 CLINICAL DATA:  [id number] S/P thoracentesis [id number] EXAM: PORTABLE CHEST 1 VIEW COMPARISON:  Chest x-ray 11/18/2023, CT chest 11/19/2023 FINDINGS: Left chest wall triple lead pacemaker defibrillator. The heart and mediastinal contours are unchanged. Atherosclerotic plaque. Aortic valve replacement. Persistent patchy right lung airspace opacities. No pulmonary edema. Interval decrease in trace left pleural effusion. No definite right pleural effusion. No pneumothorax. No acute osseous abnormality.  Sternotomy wires are intact. IMPRESSION: 1. Persistent patchy right lung airspace opacities better evaluated on CT chest 11/19/2023. 2. Interval decrease in trace left pleural effusion. 3.  Aortic Atherosclerosis (ICD10-I70.0). Electronically Signed   By: Tish Frederickson M.D.   On: 11/19/2023 19:48   CT CHEST WO CONTRAST Result Date: 11/19/2023 CLINICAL DATA:  Pneumonia, complications suspected. EXAM: CT CHEST WITHOUT CONTRAST TECHNIQUE: Multidetector CT imaging of the chest was performed following the standard protocol  without IV contrast. RADIATION DOSE REDUCTION: This exam was performed according to the departmental dose-optimization program which includes  automated exposure control, adjustment of the mA and/or kV according to patient size and/or use of iterative reconstruction technique. COMPARISON:  Chest radiograph 11/18/2023 and cardiac CT 10/28/2016 FINDINGS: Cardiovascular: Left chest ICD. Patient has biventricular leads. Status post TAVR. Heart size is within normal limits. No significant pericardial effusion. Status post CABG. Atherosclerotic calcifications in thoracic aorta without aneurysm. Prominent atherosclerotic calcifications in the proximal abdominal aorta particularly near the origin of the celiac trunk and SMA. Again noted is a tortuous innominate artery without prominent calcifications involving the innominate artery and proximal right subclavian artery. Mediastinum/Nodes: Small mediastinal lymph nodes. No significant hilar lymphadenopathy but limited evaluation without intravascular contrast. No significant axillary lymph node enlargement. Lungs/Pleura: Moderate sized left pleural effusion with compressive atelectasis in left lower lobe. Trachea and mainstem bronchi are patent. Extensive airspace disease throughout the right lung involving right upper lobe, right middle lobe and right lower lobe. Near complete collapse of the left lower lobe. Upper Abdomen: Images of the upper abdomen are unremarkable. Musculoskeletal: No acute bone abnormality. IMPRESSION: 1. Extensive airspace disease throughout the right lung compatible with pneumonia. 2. Moderate sized left pleural effusion with compressive atelectasis in the left lower lobe. 3. Aortic Atherosclerosis (ICD10-I70.0). Severe calcified plaque in the abdominal aorta at the origin of the visualized visceral arteries. Electronically Signed   By: Richarda Overlie M.D.   On: 11/19/2023 11:03    Cardiac Studies  ECHO 1. Left ventricular ejection fraction, by estimation, is 40 to 45%. The  left ventricle has mildly decreased function. Left ventricular diastolic  parameters are indeterminate. Abnormal  wall motion adjacent to RV pacing  lead.   2. Right ventricular systolic function is moderately reduced. The right  ventricular size is moderately enlarged. There is normal pulmonary artery  systolic pressure.   3. Left atrial size was severely dilated.   4. Right atrial size was severely dilated.   5. Large pleural effusion in the left lateral region.   6. The mitral valve is grossly normal. No evidence of mitral valve  regurgitation. No evidence of mitral stenosis.   7. Tricuspid valve regurgitation is mild to moderate.   8. The aortic valve has been repaired/replaced. Aortic valve  regurgitation is not visualized. Echo findings are consistent with normal  structure and function of the aortic valve prosthesis. Aortic valve mean  gradient measures 5.0 mmHg.   Comparison(s): Prior images reviewed side by side. Pleural effusion is  new. Study not optimized to assess for pericardial constriction; if  concern for this CMR or heart cathterization can be considered.    Patient Profile     89 y.o. 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 Vassie Loll.  Assessment & Plan    Pulmonary    - Pt admitted with severe hypoxia, respiratory failure/ CT of chest showed moderate  L pleural effusion and extensive R sided airspaice disease   He is positive for coronavirus sugg of R sided pneumonia   PT on ABX  He is s/p thoracentesis of IL  bloody pleural fluid   CCM is following   Concern for possible amiodarone lung toxicity,   - PT was on amiodarone for PVC suppression.   (Not afib)   CCM  concerned about amio lung toxicity so amio was held  - REmains on HFNC but notes significant improvement with breathing since yesterday  after diuresis and thoracentesis It is unlikely that amio toxicity would respond so quickly    - It would also  be unlikely to have amio toxicity show CT findings limited to one side.  But, L lung compressed by effusion    Could not see    - Amino currently on hold,  Reviewed with EP   It is still in his system  Will need to address in near future       - Would repeat Chest CT at some point now that effusion gone and when pt improves        I would add amiodarone 200 mg back to regimen and follow    Chronic HFmrEF \\ Pt with CRT ([employer]) Echo LVEF 40 to 45% on echo this admit  - tele this am: V paced, Sinus Tachy, HR 100-110's, occasional PVC  - He has been treated with IV lasix with 5 L out. Also s/p thoracentesis      - Also weight down 246 to 239. Can convert to home PO torsemide  GDMT therapy on hold due to hypotension but had normalized now.  Pt had been on Toprol XL 100 mg, Losartan 25 mg, Spironolactone 25 mg, Imdur 30 mg, Torsemide 20 mg.  Not a candidate to Jardiance due to UTI's  -  Will add back low dose toprol to regimen  25 mg today    Follow BP   Slowly add back meds as bp tolerates .    Permanent Afib  - 100% afib burden on device as above  Pt failed  cardioversion and amio in past   - Continue pradaxa.    PVCs    As noted above   Has been on Amio but currently on hold    CAD s/p CABG x 4 in 10/2005 (LIMA-LAD, SVG-D, SVG-OM, SVG-RCA) - Pt denies CP  - Continue ASA 81 mg, Lipitor 20 mg   Aortic Stenosis  - s/p TAVR in 2018     - Echo shows prosthesis functioning normally     CKD, S3a -  eGFR~ 46, Baseline Cr ~ 1.3 - 1.5, this am: 1.24. Has normalized    Liver   Mild elevation of tranaminses  Follow    Thyroid     TSH 0.16  Thyroid dose being adjusted      DM2 - A1C: 6.8 on 10/14/23    For questions or updates, please  contact [facility] Please consult [email or web address] for contact info under     Signed, Basilio Cairo, PA-C  11/21/2023, 8:43 AM

## 2023-11-21 NOTE — TOC Progression Note (Addendum)
 Transition of Care [facility]) - Progression Note    Patient Details  Name: Justin [name] MRN: [id number] Date of Birth: 02-21-1934  Transition of Care [facility]) CM/SW Contact  Howell Rucks, RN Phone Number: 11/21/2023, 2:01 PM  Clinical Narrative: patient remains in ICU on  HF 02, continues medical treatment. TOC will continue to follow.     -2:42pm Call received from [facility] with [facility], updated on pt's medical condition pt remains in ICU with current expected dc date 12/01/23. Annice Pih reports will continue to call for updates.       Barriers to Discharge: Continued Medical Work up  Expected Discharge Plan and Services In-house Referral: NA     Living arrangements for the past 2 months: Independent Living Facility                 DME Arranged: N/A DME Agency: NA       HH Arranged: NA HH Agency: NA         Social Determinants of Health (SDOH) Interventions SDOH Screenings   Food Insecurity: No Food Insecurity (11/18/2023)  Housing: Low Risk  (11/18/2023)  Transportation Needs: No Transportation Needs (11/18/2023)  Utilities: Not At Risk (11/18/2023)  Alcohol Screen: Low Risk  (03/31/2023)  Depression (PHQ2-9): High Risk (11/13/2023)  Financial Resource Strain: Low Risk  (03/31/2023)  Physical Activity: Sufficiently Active (03/31/2023)  Social Connections: Socially Integrated (11/18/2023)  Stress: No Stress Concern Present (03/31/2023)  Tobacco Use: Medium Risk (11/17/2023)  Health Literacy: Adequate Health Literacy (03/31/2023)    Readmission Risk Interventions    11/19/2023    1:00 PM  Readmission Risk Prevention Plan  Transportation Screening Complete  PCP or Specialist Appt within 3-5 Days Complete  HRI or Home Care Consult Complete  Social Work Consult for Recovery Care Planning/Counseling Complete  Palliative Care Screening Not Applicable  Medication Review Ocean[employer]) Complete

## 2023-11-21 NOTE — Evaluation (Signed)
 Clinical/Bedside Swallow Evaluation Patient Details  Name: Justin [name] MRN: [id number] Date of Birth: 1934-03-27  Today's Date: 11/21/2023 Time: SLP Start Time (ACUTE ONLY): 1421 SLP Stop Time (ACUTE ONLY): 1446 SLP Time Calculation (min) (ACUTE ONLY): 25 min  Past Medical History:  Past Medical History:  Diagnosis Date   Age-related macular degeneration, dry, both eyes    Allergic    "24/7; 365 days/year; I'm allergic to pollens, dust, all southern grasses/trees, mold, mildue, cats, dogs" (01/07/2017)   Anal fissure    Asthma    sees Dr. Kendrick Fries    Benign prostatic hypertrophy    (sees Dr. Chester Holstein   CAD (coronary artery disease)    a. s/p CABG 2007. b. Cath 08/2016 - 4/4 patent grafts.   Carotid bruit    carotid u/s 10/10: 0.39% bilaterally   Chronic combined systolic and diastolic CHF (congestive heart failure) (HCC)    Complication of anesthesia 1980s   "w/anal cyst OR, he gave me a saddle block then put a narcotic in spinal cord; had a severe reaction to that" (01/07/2017)   Congestive heart failure (CHF) [facility])    ED (erectile dysfunction)    Family history of adverse reaction to anesthesia    "daughter wakes up during OR" (01/07/2017)   GERD (gastroesophageal reflux disease)    Gout    HTN (hypertension)    Hx of colonic polyps    (sees Dr. Marina Goodell)   Hyperlipidemia    Hypothyroidism    Moderate to severe aortic stenosis    a. s/p TAVR 02/2017.   Myocardial infarction [facility]) ~ 2000   Obesity    Osteoarthritis    "was in my knees, hands" (01/07/2017 )   PAF (paroxysmal atrial fibrillation) (HCC)    a. documented post TAVR.   Precancerous skin lesion    (sees Dr. 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   29 mm Edwards Sapien 3 transcatheter heart valve placed via percutaneous right transfemoral approach   Past Surgical History:  Past Surgical History:  Procedure Laterality  Date   ANUS SURGERY     "opened it back up cause it wouldn't heal; wound up w/a fissure" (01/07/2017)   BIV ICD INSERTION CRT-D N/A 05/02/2017   Procedure: BIV ICD INSERTION CRT-D;  Surgeon: Duke Salvia, MD;  Location: [facility] INVASIVE CV LAB;  Service: Cardiovascular;  Laterality: N/A;   CARDIAC CATHETERIZATION  10/29/2005   CARDIAC CATHETERIZATION N/A 08/27/2016   Procedure: Right/Left Heart Cath and Coronary/Graft Angiography;  Surgeon: Kathleene Hazel, MD;  Location: [facility] INVASIVE CV LAB;  Service: Cardiovascular;  Laterality: N/A;   CARDIOVERSION N/A 04/24/2021   Procedure: CARDIOVERSION;  Surgeon: Pricilla Riffle, MD;  Location: [facility] ENDOSCOPY;  Service: Cardiovascular;  Laterality: N/A;   CARDIOVERSION N/A 11/08/2021   Procedure: CARDIOVERSION;  Surgeon: Chrystie Nose, MD;  Location: [facility] ENDOSCOPY;  Service: Cardiovascular;  Laterality: N/A;   CARDIOVERSION N/A 02/06/2022   Procedure: CARDIOVERSION;  Surgeon: Wendall Stade, MD;  Location: [facility] ENDOSCOPY;  Service: Cardiovascular;  Laterality: N/A;   CATARACT EXTRACTION W/ INTRAOCULAR LENS  IMPLANT, BILATERAL Bilateral    CATARACT EXTRACTION, BILATERAL  2012   COLONOSCOPY  06/30/2008   no repeats needed    COLONOSCOPY     had 3 or 4 in the past    CORONARY ARTERY BYPASS GRAFT  2007   "CABG X4"   CYST EXCISION PERINEAL  1980s   HAMMER TOE SURGERY Bilateral    ICD GENERATOR CHANGEOUT N/A 05/30/2023   Procedure: ICD GENERATOR CHANGEOUT;  Surgeon: Duke Salvia, MD;  Location: [facility] INVASIVE CV LAB;  Service: Cardiovascular;  Laterality: N/A;   JOINT REPLACEMENT     KNEE ARTHROPLASTY  07/30/2011   Procedure: COMPUTER ASSISTED TOTAL KNEE ARTHROPLASTY;  Surgeon: Cammy Copa;  Location: [facility] OR;  Service: Orthopedics;  Laterality: Left;  left total knee arthroplasty   MASTECTOMY SUBCUTANEOUS Bilateral    MULTIPLE TOOTH EXTRACTIONS     ORIF FINGER / THUMB FRACTURE Right ~ 1980   "repair of thumb injury"   PROSTATE BIOPSY     REPLACEMENT  TOTAL KNEE BILATERAL Bilateral 2012   TEE WITHOUT CARDIOVERSION N/A 03/04/2017   Procedure: TRANSESOPHAGEAL ECHOCARDIOGRAM (TEE);  Surgeon: Kathleene Hazel, MD;  Location: [facility] OR;  Service: Open Heart Surgery;  Laterality: N/A;   TOTAL KNEE REVISION Right 05/18/2019   Procedure: RIGHT PATELLA REVISION/REMOVAL;  Surgeon: Cammy Copa, MD;  Location: [facility] OR;  Service: Orthopedics;  Laterality: Right;   TRANSCATHETER AORTIC VALVE REPLACEMENT, TRANSFEMORAL N/A 03/04/2017   Procedure: TRANSCATHETER AORTIC VALVE REPLACEMENT, TRANSFEMORAL;  Surgeon: Kathleene Hazel, MD;  Location: [facility] OR;  Service: Open Heart Surgery;  Laterality: N/A;   HPI:  89 yo presenting 3/24 with shortness of breath. CXR showed asymmetric interstitial opacities concerning for atypical pneumonia versus pulmonary edema or both. RVP swab positive for coronavirus OC 43.  He also became more hypoxic requiring salter high flow. PMH includes: GERD, BPH, CAD s/p CABG, CHF, HTN, HLD, DMII, AS s/p TAVR, PAF, hypothyroidism    Assessment / Plan / Recommendation  Clinical Impression  Pt describes an isolated incident this morning in which he was slouched in bed for breakfast, resulting in solids feeling stuck (pointing toward his stomach), prompting him to cough to try to clear it, but ultimately feeling relieved of his symptoms once repositioned OOB into a chair. Upon further questioning he does add that he has symptoms of reflux and his daughters endorse a h/o chronic cough not just with PO intake. Oral motor exam is WNL and his oropharyngeal swallow appears to be functional with symptoms not presenting themselves until he tried a cracker. Pt reported it to be dry and liquid wash resulted in a cough. Pt says he tends to get coughing when drinking via straw or when eating dry foods. Although further assessment of his swallowing via MBS may be beneficial, he is on too much supplemental O2 for this to be considered at this time. Pt  and family voice their understanding and agree to use of precautions on current diet. Education was provided about aspiration and esophageal precautions, encouraging him to select softer/more moist foods. Would also take breaks for respiratory status as needed. SLP will continue to follow and can consider further testing if still indicated once O2 needs are lower.   SLP Visit Diagnosis: Dysphagia, unspecified (R13.10)    Aspiration Risk       Diet Recommendation Regular;Thin liquid    Liquid Administration via: Cup;No straw Medication Administration: Whole meds with puree Supervision: Patient able to self feed;Intermittent supervision to cue for compensatory strategies Compensations: Slow rate;Small sips/bites;Follow solids with liquid Postural Changes: Seated upright at 90 degrees;Remain upright for at least 30 minutes after po intake    Other  Recommendations Oral Care Recommendations: Oral care BID    Recommendations for follow up therapy are one component of a multi-disciplinary discharge planning process, led by  the attending physician.  Recommendations may be updated based on patient status, additional functional criteria and insurance authorization.  Follow up Recommendations  (TBA)      Assistance Recommended at Discharge    Functional Status Assessment Patient has had a recent decline in their functional status and demonstrates the ability to make significant improvements in function in a reasonable and predictable amount of time.  Frequency and Duration min 2x/week  2 weeks       Prognosis Prognosis for improved oropharyngeal function: Good      Swallow Study   General HPI: 89 yo presenting 3/24 with shortness of breath. CXR showed asymmetric interstitial opacities concerning for atypical pneumonia versus pulmonary edema or both. RVP swab positive for coronavirus OC 43.  He also became more hypoxic requiring salter high flow. PMH includes: GERD, BPH, CAD s/p CABG, CHF,  HTN, HLD, DMII, AS s/p TAVR, PAF, hypothyroidism Type of Study: Bedside Swallow Evaluation Previous Swallow Assessment: none in chart Diet Prior to this Study: Regular;Thin liquids (Level 0) Temperature Spikes Noted: No Respiratory Status: Nasal cannula History of Recent Intubation: No Behavior/Cognition: Alert;Cooperative;Pleasant mood Oral Cavity Assessment: Within Functional Limits Oral Care Completed by SLP: No Oral Cavity - Dentition: Adequate natural dentition (has a broken molar on his L side, waiting to be fixed once he d/c) Vision: Functional for self-feeding (impaired, but able to self-feed) Self-Feeding Abilities: Able to feed self;Needs set up Patient Positioning: Upright in chair Baseline Vocal Quality: Normal Volitional Cough: Strong Volitional Swallow: Able to elicit    Oral/Motor/Sensory Function Overall Oral Motor/Sensory Function: Within functional limits   Ice Chips Ice chips: Not tested   Thin Liquid Thin Liquid: Impaired Presentation: Self Fed;Cup;Straw Pharyngeal  Phase Impairments: Cough - Immediate    Nectar Thick Nectar Thick Liquid: Not tested   Honey Thick Honey Thick Liquid: Not tested   Puree Puree: Within functional limits Presentation: Self Fed;Spoon   Solid     Solid: Impaired Presentation: Self Fed Pharyngeal Phase Impairments: Cough - Immediate      Mahala Menghini., M.A. CCC-SLP Acute Rehabilitation Services Office [phone]  Secure chat preferred  11/21/2023,2:53 PM

## 2023-11-22 DIAGNOSIS — J9601 Acute respiratory failure with hypoxia: Secondary | ICD-10-CM | POA: Diagnosis not present

## 2023-11-22 DIAGNOSIS — B342 Coronavirus infection, unspecified: Secondary | ICD-10-CM | POA: Diagnosis not present

## 2023-11-22 DIAGNOSIS — J90 Pleural effusion, not elsewhere classified: Secondary | ICD-10-CM | POA: Diagnosis not present

## 2023-11-22 DIAGNOSIS — J129 Viral pneumonia, unspecified: Secondary | ICD-10-CM | POA: Diagnosis not present

## 2023-11-22 LAB — BASIC METABOLIC PANEL WITH GFR
Anion gap: 12 (ref 5–15)
Anion gap: 10 (ref 5–15)
BUN: 52 mg/dL — ABNORMAL HIGH (ref 8–23)
BUN: 49 mg/dL — ABNORMAL HIGH (ref 8–23)
CO2: 25 mmol/L (ref 22–32)
CO2: 28 mmol/L (ref 22–32)
Calcium: 8 mg/dL — ABNORMAL LOW (ref 8.9–10.3)
Calcium: 7.8 mg/dL — ABNORMAL LOW (ref 8.9–10.3)
Chloride: 99 mmol/L (ref 98–111)
Chloride: 97 mmol/L — ABNORMAL LOW (ref 98–111)
Creatinine, Ser: 1.11 mg/dL (ref 0.61–1.24)
Creatinine, Ser: 1.16 mg/dL (ref 0.61–1.24)
GFR, Estimated: >60 mL/min (ref >60)
GFR, Estimated: >60 mL/min (ref >60)
Glucose, Bld: 137 mg/dL — ABNORMAL HIGH (ref 70–99)
Glucose, Bld: 205 mg/dL — ABNORMAL HIGH (ref 70–99)
Potassium: 3.7 mmol/L (ref 3.5–5.1)
Potassium: 3.8 mmol/L (ref 3.5–5.1)
Sodium: 136 mmol/L (ref 135–145)
Sodium: 135 mmol/L (ref 135–145)

## 2023-11-22 LAB — CBC WITH DIFFERENTIAL/PLATELET
Abs Immature Granulocytes: 0.12 10*3/uL — ABNORMAL HIGH (ref 0.00–0.07)
Basophils Absolute: 0 10*3/uL (ref 0.0–0.1)
Basophils Relative: 0 %
Eosinophils Absolute: 0 10*3/uL (ref 0.0–0.5)
Eosinophils Relative: 0 %
HCT: 42.4 % (ref 39.0–52.0)
Hemoglobin: 13.2 g/dL (ref 13.0–17.0)
Immature Granulocytes: 1 %
Lymphocytes Relative: 4 %
Lymphs Abs: 0.7 10*3/uL (ref 0.7–4.0)
MCH: 27 pg (ref 26.0–34.0)
MCHC: 31.1 g/dL (ref 30.0–36.0)
MCV: 86.7 fL (ref 80.0–100.0)
Monocytes Absolute: 1.7 10*3/uL — ABNORMAL HIGH (ref 0.1–1.0)
Monocytes Relative: 9 %
Neutro Abs: 15.6 10*3/uL — ABNORMAL HIGH (ref 1.7–7.7)
Neutrophils Relative %: 86 %
Platelets: 352 10*3/uL (ref 150–400)
RBC: 4.89 MIL/uL (ref 4.22–5.81)
RDW: 18.3 % — ABNORMAL HIGH (ref 11.5–15.5)
WBC: 18 10*3/uL — ABNORMAL HIGH (ref 4.0–10.5)
nRBC: 0 % (ref 0.0–0.2)

## 2023-11-22 LAB — SEDIMENTATION RATE: Sed Rate: 28 mm/h — ABNORMAL HIGH (ref 0–16)

## 2023-11-22 LAB — GLUCOSE, CAPILLARY
Glucose-Capillary: 104 mg/dL — ABNORMAL HIGH (ref 70–99)
Glucose-Capillary: 91 mg/dL (ref 70–99)
Glucose-Capillary: 130 mg/dL — ABNORMAL HIGH (ref 70–99)
Glucose-Capillary: 309 mg/dL — ABNORMAL HIGH (ref 70–99)

## 2023-11-22 LAB — CK TOTAL AND CKMB (NOT AT ARMC)
CK, MB: 4 ng/mL (ref 0.5–5.0)
Total CK: 29 U/L — ABNORMAL LOW (ref 49–397)

## 2023-11-22 LAB — LACTIC ACID, PLASMA: Lactic Acid, Venous: 2.1 mmol/L (ref 0.5–1.9)

## 2023-11-22 LAB — BRAIN NATRIURETIC PEPTIDE: B Natriuretic Peptide: 682 pg/mL — ABNORMAL HIGH (ref 0.0–100.0)

## 2023-11-22 LAB — MAGNESIUM: Magnesium: 2.8 mg/dL — ABNORMAL HIGH (ref 1.7–2.4)

## 2023-11-22 LAB — PROCALCITONIN: Procalcitonin: <0.1 ng/mL

## 2023-11-22 LAB — PARATHYROID HORMONE, INTACT (NO CA): PTH: 32 pg/mL (ref 15–65)

## 2023-11-22 MED ORDER — METHYLPREDNISOLONE SODIUM SUCC 125 MG IJ SOLR
125.0000 mg | Freq: Every day | INTRAMUSCULAR | Status: AC
  Administered 2023-11-22: 125 mg via INTRAVENOUS
  Filled 2023-11-22: qty 2

## 2023-11-22 MED ORDER — POTASSIUM CHLORIDE 20 MEQ PO PACK
40.0000 meq | PACK | Freq: Once | ORAL | Status: AC
  Administered 2023-11-22: 40 meq via ORAL
  Filled 2023-11-22: qty 2

## 2023-11-22 MED ORDER — CALCIUM CARBONATE ANTACID 500 MG PO CHEW
1.0000 | CHEWABLE_TABLET | Freq: Three times a day (TID) | ORAL | Status: DC | PRN
  Administered 2023-11-22 – 2023-11-27 (×14): 200 mg via ORAL
  Filled 2023-11-22 (×14): qty 1

## 2023-11-22 MED ORDER — PREDNISONE 20 MG PO TABS
40.0000 mg | ORAL_TABLET | Freq: Every day | ORAL | Status: AC
  Administered 2023-11-23 – 2023-11-27 (×5): 40 mg via ORAL
  Filled 2023-11-22 (×5): qty 2

## 2023-11-22 MED ORDER — FUROSEMIDE 10 MG/ML IJ SOLN
40.0000 mg | Freq: Four times a day (QID) | INTRAMUSCULAR | Status: AC
Start: 2023-11-22 — End: 2023-11-22
  Administered 2023-11-22 (×2): 40 mg via INTRAVENOUS
  Filled 2023-11-22 (×2): qty 4

## 2023-11-22 NOTE — Progress Notes (Signed)
 BIPAP and HHFNC is not needed at this time. Patient is on HFNC at 7 L sating 98%.

## 2023-11-22 NOTE — Progress Notes (Signed)
 RT TOOK PATIENT OFF HEATED HIGH FLOW AND PLACED ON 7L REGULAR HIGH FLOW PATIENT DOING WELL AT THIS TIME.

## 2023-11-22 NOTE — Progress Notes (Signed)
 NAME:  Justin [name], MRN:  [id number], DOB:  [birth year], LOS: 4 ADMISSION DATE:  11/17/2023, CONSULTATION DATE:  11/18/23 REFERRING MD:  EDP, CHIEF COMPLAINT:  hypotension   History of Present Illness:  89 yo male presented to [facility]  early today after reporting increasing fatigue and hypoxia at home. Pt has been dealing with this for a few days, he states today his shortness of breath increased. He had previously been seen by his PCP for symptoms and was diagnosed with bronchitis. He was started on amoxicillin and without improvement changed to doxy and prednisone.   Upon presentation pt was found to be hypoxic in the 70's. Cxr c/w multilobar pna with possible superimposed edema. Wbc elevated as well as BNP.   Pt denies cp, dizziness, no fever chills at home, worsening exertional dyspnea.   At this time pt states he is comfortable but would prefer nasal cannula over NIV. No other complaints  Pertinent  Medical History  Cad s/p cabg H/o chf combined systolic/diastolic Htn Hyperlipidemia hypothyroidism Ao stenosis s/p TAVR Pafib bph  Significant Hospital Events: Including procedures, antibiotic start and stop dates in addition to other pertinent events   3/24 Admitted to [facility]  3/25 Transferred to ICU 2/2 increasing oxygen requirement and marginal pressure 3/26 improving, though still with significant O2 requirement, left thoracentesis with bloody fluid removed. CT chest with extensive airspace disease of right lung comparable with PNA 3/27 ? Amio toxicity, cardiology consult O2 requirement improving  3/28 Oxygen requirement back up now requiring 40L HHFNC, reports non-productive cough    Micro: Legionella>negative  Strep pneumo>negative Covid/flu>negative RVP + coronavirus OC43 BC>NGTD UC> 100+ ESBL E. coli  Interim History / Subjective:  Seen sitting up in bed eating breakfast, he is feeling better  Objective   Blood pressure 132/86, pulse (!) 102, temperature 97.9  F (36.6 C), temperature source Oral, resp. rate (!) 21, height 5' 6.5" (1.689 m), weight 110 kg, SpO2 90%.    FiO2 (%):  [45 %-50 %] 45 %   Intake/Output Summary (Last 24 hours) at 11/22/2023 0819 Last data filed at 11/22/2023 0000 Gross per 24 hour  Intake 350 ml  Output 1100 ml  Net -750 ml   Filed Weights   11/17/23 2244 11/20/23 2242 11/21/23 2224  Weight: 111.6 kg 108.8 kg 110 kg   Physical Exam  General: Acute on chronically ill appearing elderly male sitting in bed, in NAD HEENT: [place]/AT, MM pink/moist, PERRL,  Neuro: Alert and oriented x3, non-focal  CV: s1s2 regular rate and rhythm, no murmur, rubs, or gallops,  PULM:  Slightly diminished bilaterally no increased work of breathing, no added breath sounds  GI: soft, bowel sounds active in all 4 quadrants, non-tender, non-distended, tolerating oral diet Extremities: warm/dry, no edema  Skin: no rashes or lesions  Resolved Hospital Problem list   Sepsis with marginal pressures (not req pressors)  Assessment & Plan:   Acute hypoxic resp failure Viral Pneumonia -RVP + for Coronavirus  -CT chest with extensive R airspace disease, unusual for viral pna to be unilateral, ? Atypical pattern of amiodarone toxicity - Cardiology has restarted amio PO, possible unilateral pulmonary edema albeit this is rare L Pleural Effusion  -Sl/p thora 3/26 with bloody fluid removed, exudative via LDH P: Continue HHFNC, wean as able, imrpoving Remains on Ceftriaxone and Doxy, plan 7 days Head of bed elevated 30 degrees Follow intermittent chest x-ray and ABG.   Mobilize as able  Continue neb treatments High dose steroids  x 5 days to end 3/29 to be followed by prednisone 40 mg daily x 5 days then will need subsequent taper Aggressive diuresis lasix 40 mg x2 3/29 - he looks volume overloaded and atypical but R infiltrates could represent unilateral pulmonary edema in setting of LV failure and volume removal should help with VQ mismatch  associated with RV failure  Best Practice (right click and "Reselect all SmartList Selections" daily)   Per primary   Critical care time:    Karren Burly, MD [facility] Personal contact information can be found on Amion  If no contact or response made please call [phone] 11/22/2023, 8:19 AM

## 2023-11-22 NOTE — Progress Notes (Signed)
 Progress Note    Justin [name]   ZOX:[id number]  DOB: 1933-11-28  DOA: 11/17/2023     4 PCP: Justin [name], Man X, NP  Initial CC: SOB  Hospital Course: [name] is an 89 yo male with PMH BPH, CAD s/p CABG, CHF, HTN, HLD, DMII, AS s/p TAVR, PAF, hypothyroidism who presented with shortness of breath.  He was recently seen by primary care and also given abx due to symptoms along with prednisone.  He required BIPAP on admission and became progressively more hypotensive also.  CXR showed asymmetric interstitial opacities concerning for atypical pneumonia versus pulmonary edema or both. RVP swab positive for coronavirus OC 43.  He also became more hypoxic requiring salter high flow. WBC elevated at 29.7 on admission and he was also started on antibiotics. Other notable labs included elevated BNP, 880 and he was given lasix as well.   Interval History:  Continues to remain on Optiflow but is awake, alert, and breathing comfortably.  Still having dry cough.  Assessment and Plan: * Acute respiratory failure with hypoxia (HCC) - Initially presumed due to viral pna (see below); given slow improvement, other differentials have been considered including volume overload versus amiodarone toxicity versus viral lung injury - requiring HF; not on O2 at baseline at home; wean as able - steroids added 3/25; taper planned  Pleural effusion - moderate left pleural effusion since admission; underwent thoracentesis with PCCM on 3/26 - follow fluid culture (remains negative)  Coronavirus infection - RVP positive for coronavirus OC43 (non-covid) - CXR looks like diffuse GGO and he was very hypoxic and hypotensive on admission -CT chest obtained on 11/19/2023 showing significant involvement of right lung, sparing left lung for the most part but does have moderate-sized left pleural effusion - other considered differential per pulm was possible amio toxicity but I agree, would expect a bilateral effect rather  than unilateral as being seen on CT chest  - continue nebs - added steroids given profound hypoxia; taper now planned - continue droplet precautions  -Diuresing intermittently with Lasix -Remains on Rocephin and doxycycline empirically  Hypothyroidism - Continue Synthroid -TSH suppressed, 0.169; was normal 10/14/23 but elevated 06/30/23. Also had mildly elevated FT4 on 06/30/23 at 1.21 but now more elevated on admission at 1.68 (dose has not changed on looking at fill history) - will decrease from 200 mcg daily to 175 mcg daily (correlates more with weight based dose too) and needs repeat TSH and FT4 in about 4-6 weeks  Asymptomatic bacteriuria - urine culture noted with ecoli esbl but UA with 0-5 WBC and patient asymptomatic - I would favor not treating this organism as doesn't appear infectious  Type 2 diabetes mellitus (HCC) - A1c 6.8 % on 10/14/23 - continue on SSI and CBGs  MDD (major depressive disorder) - Continue Effexor  Chronic kidney disease, stage 3a (HCC) - patient has history of CKD3a. Baseline creat ~ 1.3 - 1.4, eGFR~ 50 - at baseline   Paroxysmal atrial fibrillation (HCC) - Continue Pradaxa and amiodarone  Gastroesophageal reflux disease - Continue Protonix  Essential hypertension - Significant hypotension on admission borderline requiring pressors; antihypertensives on hold  Hyperlipidemia - Continue Lipitor   Old records reviewed in assessment of this patient  Antimicrobials: Rocephin 11/18/2023 >> current Doxycycline 11/18/2023 >> current  DVT prophylaxis:  SCDs Start: 11/17/23 1536 dabigatran (PRADAXA) capsule 150 mg   Code Status:   Code Status: Limited: Do not attempt resuscitation (DNR) -DNR-LIMITED -Do Not Intubate/DNI   Mobility Assessment (Last 72  Hours)     Mobility Assessment   No documentation.           Barriers to discharge: None Disposition Plan: Home HH orders placed:  Status is: Inpatient  Objective: Blood pressure (!)  139/99, pulse (!) 53, temperature 97.9 F (36.6 C), temperature source Oral, resp. rate 19, height 5' 6.5" (1.689 m), weight 110 kg, SpO2 96%.  Examination:  Physical Exam Constitutional:      General: He is not in acute distress.    Appearance: Normal appearance.  HENT:     Head: Normocephalic and atraumatic.     Mouth/Throat:     Mouth: Mucous membranes are moist.  Eyes:     Extraocular Movements: Extraocular movements intact.  Cardiovascular:     Rate and Rhythm: Normal rate and regular rhythm.  Pulmonary:     Effort: Pulmonary effort is normal. No respiratory distress.     Breath sounds: Rhonchi (Worse on right) present. No wheezing.  Abdominal:     General: Bowel sounds are normal. There is no distension.     Palpations: Abdomen is soft.     Tenderness: There is no abdominal tenderness.  Musculoskeletal:        General: Normal range of motion.     Cervical back: Normal range of motion and neck supple.     Right lower leg: No edema.     Left lower leg: No edema.  Skin:    General: Skin is warm and dry.  Neurological:     General: No focal deficit present.     Mental Status: He is alert.  Psychiatric:        Mood and Affect: Mood normal.        Behavior: Behavior normal.      Consultants:  Pulmonology Cardiology  Procedures:  11/19/2023: Left thoracentesis  Data Reviewed: Results for orders placed or performed during the hospital encounter of 11/17/23 (from the past 24 hours)  Glucose, capillary     Status: Abnormal   Collection Time: 11/21/23 11:51 AM  Result Value Ref Range   Glucose-Capillary 109 (H) 70 - 99 mg/dL   Comment 1 Notify RN    Comment 2 Document in Chart   Glucose, capillary     Status: Abnormal   Collection Time: 11/21/23  5:32 PM  Result Value Ref Range   Glucose-Capillary 189 (H) 70 - 99 mg/dL   Comment 1 Notify RN    Comment 2 Document in Chart   Glucose, capillary     Status: Abnormal   Collection Time: 11/21/23  9:24 PM  Result Value  Ref Range   Glucose-Capillary 187 (H) 70 - 99 mg/dL   Comment 1 Notify RN    Comment 2 Document in Chart   Basic metabolic panel     Status: Abnormal   Collection Time: 11/22/23  2:31 AM  Result Value Ref Range   Sodium 136 135 - 145 mmol/L   Potassium 3.7 3.5 - 5.1 mmol/L   Chloride 99 98 - 111 mmol/L   CO2 25 22 - 32 mmol/L   Glucose, Bld 137 (H) 70 - 99 mg/dL   BUN 52 (H) 8 - 23 mg/dL   Creatinine, Ser 7.89 0.61 - 1.24 mg/dL   Calcium 8.0 (L) 8.9 - 10.3 mg/dL   GFR, Estimated >38 >10 mL/min   Anion gap 12 5 - 15  CBC with Differential/Platelet     Status: Abnormal   Collection Time: 11/22/23  2:31 AM  Result Value  Ref Range   WBC 18.0 (H) 4.0 - 10.5 K/uL   RBC 4.89 4.22 - 5.81 MIL/uL   Hemoglobin 13.2 13.0 - 17.0 g/dL   HCT 16.1 09.6 - 04.5 %   MCV 86.7 80.0 - 100.0 fL   MCH 27.0 26.0 - 34.0 pg   MCHC 31.1 30.0 - 36.0 g/dL   RDW 40.9 (H) 81.1 - 91.4 %   Platelets 352 150 - 400 K/uL   nRBC 0.0 0.0 - 0.2 %   Neutrophils Relative % 86 %   Neutro Abs 15.6 (H) 1.7 - 7.7 K/uL   Lymphocytes Relative 4 %   Lymphs Abs 0.7 0.7 - 4.0 K/uL   Monocytes Relative 9 %   Monocytes Absolute 1.7 (H) 0.1 - 1.0 K/uL   Eosinophils Relative 0 %   Eosinophils Absolute 0.0 0.0 - 0.5 K/uL   Basophils Relative 0 %   Basophils Absolute 0.0 0.0 - 0.1 K/uL   Immature Granulocytes 1 %   Abs Immature Granulocytes 0.12 (H) 0.00 - 0.07 K/uL  Magnesium     Status: Abnormal   Collection Time: 11/22/23  2:31 AM  Result Value Ref Range   Magnesium 2.8 (H) 1.7 - 2.4 mg/dL  Procalcitonin     Status: None   Collection Time: 11/22/23  2:31 AM  Result Value Ref Range   Procalcitonin <0.10 ng/mL  Sedimentation rate     Status: Abnormal   Collection Time: 11/22/23  2:31 AM  Result Value Ref Range   Sed Rate 28 (H) 0 - 16 mm/hr  Brain natriuretic peptide     Status: Abnormal   Collection Time: 11/22/23  2:31 AM  Result Value Ref Range   B Natriuretic Peptide 682.0 (H) 0.0 - 100.0 pg/mL  CK total and  CKMB (cardiac)not at [facility]     Status: Abnormal   Collection Time: 11/22/23  2:31 AM  Result Value Ref Range   Total CK 29 (L) 49 - 397 U/L   CK, MB 4.0 0.5 - 5.0 ng/mL  Lactic acid, plasma     Status: Abnormal   Collection Time: 11/22/23  3:11 AM  Result Value Ref Range   Lactic Acid, Venous 2.1 (HH) 0.5 - 1.9 mmol/L  Glucose, capillary     Status: Abnormal   Collection Time: 11/22/23  7:29 AM  Result Value Ref Range   Glucose-Capillary 104 (H) 70 - 99 mg/dL   Comment 1 Notify RN    Comment 2 Document in Chart    *Note: Due to a large number of results and/or encounters for the requested time period, some results have not been displayed. A complete set of results can be found in Results Review.    I have reviewed pertinent nursing notes, vitals, labs, and images as necessary. I have ordered labwork to follow up on as indicated.  I have reviewed the last notes from staff over past 24 hours. I have discussed patient's care plan and test results with nursing staff, CM/SW, and other staff as appropriate.  Critical care time to evaluate and treat this patient was 55 minutes.  Independent of separate billable services  This patient is critically ill with the following life-threatening issues requiring my presence at the bedside: Hemodynamic instability requiring titration of medications Oxygenation/ventilation instability requiring frequent modifications of support Cardiac rhythm disturbances requiring evaluation and/or interventions Fluctuations in neurologic function requiring evaluation and/or interventions and/or fluid/volume titration    LOS: 4 days   Lewie Chamber, MD [facility] 11/22/2023, 11:22  AM

## 2023-11-22 NOTE — Progress Notes (Signed)
 PT order placed per patient/family request (Dr. Frederick Peers verbal order for placement). Patient requested strengthening bands (none on unit at this time).

## 2023-11-22 NOTE — Progress Notes (Signed)
 This nurse was informed by the patient's family (at bedside) that the patient is feeling sad and would benefit talking to someone (therapist or chaplain) while here in the hospital. Informed Dr. Frederick Peers of this request and placed an order for Chaplain services for continuity of care.

## 2023-11-23 DIAGNOSIS — B342 Coronavirus infection, unspecified: Secondary | ICD-10-CM | POA: Diagnosis not present

## 2023-11-23 DIAGNOSIS — J129 Viral pneumonia, unspecified: Secondary | ICD-10-CM | POA: Diagnosis not present

## 2023-11-23 DIAGNOSIS — J9601 Acute respiratory failure with hypoxia: Secondary | ICD-10-CM | POA: Diagnosis not present

## 2023-11-23 DIAGNOSIS — J90 Pleural effusion, not elsewhere classified: Secondary | ICD-10-CM | POA: Diagnosis not present

## 2023-11-23 LAB — BASIC METABOLIC PANEL WITH GFR
Anion gap: 10 (ref 5–15)
BUN: 51 mg/dL — ABNORMAL HIGH (ref 8–23)
CO2: 29 mmol/L (ref 22–32)
Calcium: 8 mg/dL — ABNORMAL LOW (ref 8.9–10.3)
Chloride: 96 mmol/L — ABNORMAL LOW (ref 98–111)
Creatinine, Ser: 1.32 mg/dL — ABNORMAL HIGH (ref 0.61–1.24)
GFR, Estimated: 52 mL/min — ABNORMAL LOW (ref >60)
Glucose, Bld: 128 mg/dL — ABNORMAL HIGH (ref 70–99)
Potassium: 3.9 mmol/L (ref 3.5–5.1)
Sodium: 135 mmol/L (ref 135–145)

## 2023-11-23 LAB — CBC WITH DIFFERENTIAL/PLATELET
Abs Immature Granulocytes: 0.11 10*3/uL — ABNORMAL HIGH (ref 0.00–0.07)
Basophils Absolute: 0 10*3/uL (ref 0.0–0.1)
Basophils Relative: 0 %
Eosinophils Absolute: 0 10*3/uL (ref 0.0–0.5)
Eosinophils Relative: 0 %
HCT: 44.8 % (ref 39.0–52.0)
Hemoglobin: 13.8 g/dL (ref 13.0–17.0)
Immature Granulocytes: 1 %
Lymphocytes Relative: 3 %
Lymphs Abs: 0.5 10*3/uL — ABNORMAL LOW (ref 0.7–4.0)
MCH: 26.8 pg (ref 26.0–34.0)
MCHC: 30.8 g/dL (ref 30.0–36.0)
MCV: 87.2 fL (ref 80.0–100.0)
Monocytes Absolute: 1.1 10*3/uL — ABNORMAL HIGH (ref 0.1–1.0)
Monocytes Relative: 7 %
Neutro Abs: 15.2 10*3/uL — ABNORMAL HIGH (ref 1.7–7.7)
Neutrophils Relative %: 89 %
Platelets: 364 10*3/uL (ref 150–400)
RBC: 5.14 MIL/uL (ref 4.22–5.81)
RDW: 18 % — ABNORMAL HIGH (ref 11.5–15.5)
WBC: 17 10*3/uL — ABNORMAL HIGH (ref 4.0–10.5)
nRBC: 0 % (ref 0.0–0.2)

## 2023-11-23 LAB — BODY FLUID CULTURE W GRAM STAIN: Culture: NO GROWTH

## 2023-11-23 LAB — CULTURE, BLOOD (ROUTINE X 2)
Culture: NO GROWTH
Culture: NO GROWTH

## 2023-11-23 LAB — GLUCOSE, CAPILLARY
Glucose-Capillary: 133 mg/dL — ABNORMAL HIGH (ref 70–99)
Glucose-Capillary: 125 mg/dL — ABNORMAL HIGH (ref 70–99)
Glucose-Capillary: 137 mg/dL — ABNORMAL HIGH (ref 70–99)
Glucose-Capillary: 175 mg/dL — ABNORMAL HIGH (ref 70–99)

## 2023-11-23 LAB — MAGNESIUM: Magnesium: 2.7 mg/dL — ABNORMAL HIGH (ref 1.7–2.4)

## 2023-11-23 MED ORDER — POTASSIUM CHLORIDE 20 MEQ PO PACK
20.0000 meq | PACK | Freq: Once | ORAL | Status: DC
  Filled 2023-11-23: qty 1

## 2023-11-23 MED ORDER — FUROSEMIDE 10 MG/ML IJ SOLN: 40.0000 mg | Freq: Four times a day (QID) | INTRAMUSCULAR | Status: DC

## 2023-11-23 MED ORDER — POTASSIUM CHLORIDE CRYS ER 20 MEQ PO TBCR
20.0000 meq | EXTENDED_RELEASE_TABLET | Freq: Once | ORAL | Status: AC
  Administered 2023-11-23: 20 meq via ORAL
  Filled 2023-11-23: qty 1

## 2023-11-23 NOTE — Progress Notes (Signed)
 NAME:  Justin [name], MRN:  [id number], DOB:  [birth year], LOS: 5 ADMISSION DATE:  11/17/2023, CONSULTATION DATE:  11/18/23 REFERRING MD:  EDP, CHIEF COMPLAINT:  hypotension   History of Present Illness:  89 yo male presented to [facility]  early today after reporting increasing fatigue and hypoxia at home. Pt has been dealing with this for a few days, he states today his shortness of breath increased. He had previously been seen by his PCP for symptoms and was diagnosed with bronchitis. He was started on amoxicillin and without improvement changed to doxy and prednisone.   Upon presentation pt was found to be hypoxic in the 70's. Cxr c/w multilobar pna with possible superimposed edema. Wbc elevated as well as BNP.   Pt denies cp, dizziness, no fever chills at home, worsening exertional dyspnea.   At this time pt states he is comfortable but would prefer nasal cannula over NIV. No other complaints  Pertinent  Medical History  Cad s/p cabg H/o chf combined systolic/diastolic Htn Hyperlipidemia hypothyroidism Ao stenosis s/p TAVR Pafib bph  Significant Hospital Events: Including procedures, antibiotic start and stop dates in addition to other pertinent events   3/24 Admitted to [facility]  3/25 Transferred to ICU 2/2 increasing oxygen requirement and marginal pressure 3/26 improving, though still with significant O2 requirement, left thoracentesis with bloody fluid removed. CT chest with extensive airspace disease of right lung comparable with PNA 3/27 ? Amio toxicity, cardiology consult O2 requirement improving  3/28 Oxygen requirement back up now requiring 40L HHFNC, reports non-productive cough    Micro: Legionella>negative  Strep pneumo>negative Covid/flu>negative RVP + coronavirus OC43 BC>NGTD UC> 100+ ESBL E. coli  Interim History / Subjective:  Weaning O2 and seems can wean more  Objective   Blood pressure 117/74, pulse 100, temperature (!) 96.3 F (35.7 C), temperature  source Axillary, resp. rate (!) 24, height 5' 6.5" (1.689 m), weight 110 kg, SpO2 98%.    FiO2 (%):  [42 %-45 %] 42 %   Intake/Output Summary (Last 24 hours) at 11/23/2023 0716 Last data filed at 11/23/2023 0000 Gross per 24 hour  Intake 100 ml  Output 1950 ml  Net -1850 ml   Filed Weights   11/17/23 2244 11/20/23 2242 11/21/23 2224  Weight: 111.6 kg 108.8 kg 110 kg   Physical Exam  General: Acute on chronically ill appearing elderly male sitting in bed, in NAD HEENT: [place]/AT, MM pink/moist, PERRL,  Neuro: Alert and oriented x3, non-focal  CV: s1s2 regular rate and rhythm, no murmur, rubs, or gallops,  PULM:  Slightly diminished bilaterally no increased work of breathing, no added breath sounds  GI: soft, bowel sounds active in all 4 quadrants, non-tender, non-distended, tolerating oral diet Extremities: warm/dry, no edema  Skin: no rashes or lesions  Resolved Hospital Problem list   Sepsis with marginal pressures (not req pressors)  Assessment & Plan:   Acute hypoxic resp failure Viral Pneumonia -RVP + for Coronavirus OC43 -CT chest with extensive R airspace disease, unusual for viral pna to be unilateral, ? Atypical pattern of amiodarone toxicity - Cardiology has restarted amio PO, possible unilateral pulmonary edema albeit this is rare L Pleural Effusion  -Sl/p thora 3/26 with bloody fluid removed, exudative via LDH P: Continue HHFNC, wean as able, imrpoving Remains on Ceftriaxone and Doxy, plan 7 days Continue neb treatments High dose steroids x 5 days to end 3/29 to be followed by prednisone 40 mg daily x 5 days, 20 mg daily x 5  days, 10 mg for 5 days then stop Continue aggressive diuresis IV lasix 40 mg x2 - he looks volume overloaded and atypical but R infiltrates could represent unilateral pulmonary edema in setting of LV failure and volume removal should help with VQ mismatch associated with RV failure - baseline Cr appears 1.4-1.6 - diurese with IV lasix until  euvolemic as kidney function and BP allows  PCCM is available as needed  Best Practice (right click and "Reselect all SmartList Selections" daily)   Per primary   Critical care time:  n/a  Karren Burly, MD [facility] Personal contact information can be found on Amion  If no contact or response made please call [phone] 11/23/2023, 7:16 AM

## 2023-11-23 NOTE — Assessment & Plan Note (Signed)
-   still having PVCs with elevated HR intermittently - Toprol further adjusted per cardiology

## 2023-11-23 NOTE — Evaluation (Signed)
 Physical Therapy Evaluation Patient Details Name: Justin [name] MRN: [id number] DOB: 1934-03-27 Today's Date: 11/23/2023  History of Present Illness  Pt admitted from IND living at [facility] 2* acute resepiratory failure, L pleural effusion (s/p thoracentesis 11/19/23) and non-covid coronavirus.  Pt with hx of macuar degneration with very limited vision, CAD, CABG, MI, TAVR, PAF, aortic stenosis, prostate CA, DM, MDD and pacemaker placement  Clinical Impression  Pt admitted as above and presenting with functional mobility limitations 2* generalized weakness, balance deficits and decreased activity tolerance.  Pt hopes to progress to dc back to IND living at [facility] but Patient will benefit from continued inpatient follow up therapy, <3 hours/day ([facility]) to maximize IND and safety prior to return to apartment with limited assist.         If plan is discharge home, recommend the following: A little help with walking and/or transfers;A little help with bathing/dressing/bathroom;Assistance with cooking/housework;Assist for transportation;Help with stairs or ramp for entrance   Can travel by private vehicle   No    Equipment Recommendations None recommended by PT  Recommendations for Other Services       Functional Status Assessment Patient has had a recent decline in their functional status and demonstrates the ability to make significant improvements in function in a reasonable and predictable amount of time.     Precautions / Restrictions Precautions Precautions: Fall Restrictions Weight Bearing Restrictions Per Provider Order: No      Mobility  Bed Mobility Overal bed mobility: Needs Assistance Bed Mobility: Supine to Sit     Supine to sit: Contact guard     General bed mobility comments: Increased time with use of rails and CGA for safety    Transfers Overall transfer level: Needs assistance Equipment used: Rolling walker (2 wheels) Transfers:  Sit to/from Stand Sit to Stand: Min assist, From elevated surface           General transfer comment: Cues for use of UEs to self assist    Ambulation/Gait Ambulation/Gait assistance: Min assist, +2 safety/equipment Gait Distance (Feet): 15 Feet Assistive device: Rolling walker (2 wheels) Gait Pattern/deviations: Step-to pattern, Decreased step length - right, Decreased step length - left, Shuffle, Trunk flexed Gait velocity: decr     General Gait Details: Increased time with multiple rests and cues for posture and position from RW; distance ltd by fatigue  Stairs            Wheelchair Mobility     Tilt Bed    Modified Rankin (Stroke Patients Only)       Balance Overall balance assessment: Needs assistance Sitting-balance support: No upper extremity supported, Feet supported Sitting balance-Leahy Scale: Good     Standing balance support: Bilateral upper extremity supported Standing balance-Leahy Scale: Poor                               Pertinent Vitals/Pain Pain Assessment Pain Assessment: No/denies pain    Home Living Family/patient expects to be discharged to:: Other (Comment)                   Additional Comments: Pt is resident of [facility]    Prior Function Prior Level of Function : Independent/Modified Independent             Mobility Comments: using rollator at all times for mobility       Extremity/Trunk Assessment   Upper Extremity Assessment  Upper Extremity Assessment: Generalized weakness    Lower Extremity Assessment Lower Extremity Assessment: Generalized weakness (Pt reports R LE seems weaker vs L with mobility)    Cervical / Trunk Assessment Cervical / Trunk Assessment: Kyphotic  Communication   Communication Communication: No apparent difficulties    Cognition Arousal: Alert Behavior During Therapy: WFL for tasks assessed/performed                             Following  commands: Intact       Cueing Cueing Techniques: Verbal cues     General Comments      Exercises     Assessment/Plan    PT Assessment Patient needs continued PT services  PT Problem List Decreased strength;Decreased activity tolerance;Decreased balance;Decreased mobility;Decreased knowledge of use of DME;Decreased skin integrity       PT Treatment Interventions DME instruction;Gait training;Functional mobility training;Therapeutic activities;Therapeutic exercise;Patient/family education    PT Goals (Current goals can be found in the Care Plan section)  Acute Rehab PT Goals Patient Stated Goal: REgaiN IND PT Goal Formulation: With patient Time For Goal Achievement: 12/07/23 Potential to Achieve Goals: Fair    Frequency Min 3X/week     Co-evaluation               AM-PAC PT "6 Clicks" Mobility  Outcome Measure Help needed turning from your back to your side while in a flat bed without using bedrails?: None Help needed moving from lying on your back to sitting on the side of a flat bed without using bedrails?: A Little Help needed moving to and from a bed to a chair (including a wheelchair)?: A Little Help needed standing up from a chair using your arms (e.g., wheelchair or bedside chair)?: A Little Help needed to walk in hospital room?: Total Help needed climbing 3-5 steps with a railing? : Total 6 Click Score: 15    End of Session Equipment Utilized During Treatment: Gait belt;Oxygen Activity Tolerance: Patient tolerated treatment well;Patient limited by fatigue Patient left: in chair;with call bell/phone within reach;with chair alarm set Nurse Communication: Mobility status PT Visit Diagnosis: Unsteadiness on feet (R26.81);Muscle weakness (generalized) (M62.81);Difficulty in walking, not elsewhere classified (R26.2)    Time: 1610-9604 PT Time Calculation (min) (ACUTE ONLY): 32 min   Charges:   PT Evaluation $PT Eval Low Complexity: 1 Low PT  Treatments $Gait Training: 8-22 mins PT General Charges $$ ACUTE PT VISIT: 1 Visit         Mauro Kaufmann PT Acute Rehabilitation Services Pager [phone] Office [phone]   Blenda Wisecup 11/23/2023, 1:27 PM

## 2023-11-23 NOTE — Progress Notes (Signed)
 Progress Note    Justin [name]   Justin [name]  DOB: [birth year]  DOA: 11/17/2023     5 PCP: Mast, Man X, NP  Initial CC: SOB  Hospital Course: Mr. Boehle is an 89 yo male with PMH BPH, CAD s/p CABG, CHF, HTN, HLD, DMII, AS s/p TAVR, PAF, hypothyroidism who presented with shortness of breath.  He was recently seen by primary care and also given abx due to symptoms along with prednisone.  He required BIPAP on admission and became progressively more hypotensive also.  CXR showed asymmetric interstitial opacities concerning for atypical pneumonia versus pulmonary edema or both. RVP swab positive for coronavirus OC 43.  He also became more hypoxic requiring salter high flow. WBC elevated at 29.7 on admission and he was also started on antibiotics. Other notable labs included elevated BNP, 880 and he was given lasix as well.   Interval History:  Was able to be weaned off of Optiflow since yesterday. Continues to breathe comfortably.  No concerns.  Assessment and Plan: * Acute respiratory failure with hypoxia (HCC) - Initially presumed due to viral pna (see below); given slow improvement, other differentials have been considered including volume overload versus amiodarone toxicity versus viral lung injury - requiring HF; not on O2 at baseline at home; wean as able - steroids added 3/25; taper planned  Pleural effusion - moderate left pleural effusion since admission; underwent thoracentesis with PCCM on 3/26 - follow fluid culture (remains negative)  Coronavirus infection - RVP positive for coronavirus OC43 (non-covid) - CXR looks like diffuse GGO and he was very hypoxic and hypotensive on admission -CT chest obtained on 11/19/2023 showing significant involvement of right lung, sparing left lung for the most part but does have moderate-sized left pleural effusion - other considered differential per pulm was possible amio toxicity but I agree, would expect a bilateral effect rather  than unilateral as being seen on CT chest  - continue nebs - added steroids given profound hypoxia; taper now planned - continue droplet precautions  -Diuresing intermittently with Lasix -Completed 5 days of Rocephin and doxycycline empirically  Hypothyroidism - Continue Synthroid -TSH suppressed, 0.169; was normal 10/14/23 but elevated 06/30/23. Also had mildly elevated FT4 on 06/30/23 at 1.21 but now more elevated on admission at 1.68 (dose has not changed on looking at fill history) - will decrease from 200 mcg daily to 175 mcg daily (correlates more with weight based dose too) and needs repeat TSH and FT4 in about 4-6 weeks  Asymptomatic bacteriuria - urine culture noted with ecoli esbl but UA with 0-5 WBC and patient asymptomatic - I would favor not treating this organism as doesn't appear infectious  Chronic kidney disease, stage 3a (HCC) - patient has history of CKD3a. Baseline creat ~ 1.3 - 1.4, eGFR~ 50 - at baseline  -Monitor while on diuresis  Frequent PVCs - started on Toprol per cardiology on 3/28 - still having PVCs with elevated HR intermittently   Type 2 diabetes mellitus (HCC) - A1c 6.8 % on 10/14/23 - continue on SSI and CBGs  MDD (major depressive disorder) - Continue Effexor  Paroxysmal atrial fibrillation (HCC) - Continue Pradaxa and amiodarone  Gastroesophageal reflux disease - Continue Protonix  Essential hypertension - Significant hypotension on admission borderline requiring pressors; antihypertensives on hold  Hyperlipidemia - Continue Lipitor   Old records reviewed in assessment of this patient  Antimicrobials: Rocephin 11/18/2023 >> 3/30 Doxycycline 11/18/2023 >> 3/29  DVT prophylaxis:  SCDs Start: 11/17/23 1536 dabigatran (PRADAXA) capsule  150 mg   Code Status:   Code Status: Limited: Do not attempt resuscitation (DNR) -DNR-LIMITED -Do Not Intubate/DNI   Mobility Assessment (Last 72 Hours)     Mobility Assessment   No  documentation.           Barriers to discharge: None Disposition Plan: Home HH orders placed:  Status is: Inpatient  Objective: Blood pressure (!) 98/54, pulse (!) 105, temperature 97.8 F (36.6 C), temperature source Axillary, resp. rate (!) 21, height 5' 6.5" (1.689 m), weight 110 kg, SpO2 96%.  Examination:  Physical Exam Constitutional:      General: He is not in acute distress.    Appearance: Normal appearance.  HENT:     Head: Normocephalic and atraumatic.     Mouth/Throat:     Mouth: Mucous membranes are moist.  Eyes:     Extraocular Movements: Extraocular movements intact.  Cardiovascular:     Rate and Rhythm: Normal rate and regular rhythm.  Pulmonary:     Effort: Pulmonary effort is normal. No respiratory distress.     Breath sounds: Rhonchi (Worse on right) present. No wheezing.  Abdominal:     General: Bowel sounds are normal. There is no distension.     Palpations: Abdomen is soft.     Tenderness: There is no abdominal tenderness.  Musculoskeletal:        General: Normal range of motion.     Cervical back: Normal range of motion and neck supple.     Right lower leg: No edema.     Left lower leg: No edema.  Skin:    General: Skin is warm and dry.  Neurological:     General: No focal deficit present.     Mental Status: He is alert.  Psychiatric:        Mood and Affect: Mood normal.        Behavior: Behavior normal.      Consultants:  Pulmonology Cardiology  Procedures:  11/19/2023: Left thoracentesis  Data Reviewed: Results for orders placed or performed during the hospital encounter of 11/17/23 (from the past 24 hours)  Basic metabolic panel     Status: Abnormal   Collection Time: 11/22/23  2:00 PM  Result Value Ref Range   Sodium 135 135 - 145 mmol/L   Potassium 3.8 3.5 - 5.1 mmol/L   Chloride 97 (L) 98 - 111 mmol/L   CO2 28 22 - 32 mmol/L   Glucose, Bld 205 (H) 70 - 99 mg/dL   BUN 49 (H) 8 - 23 mg/dL   Creatinine, Ser 5.62 0.61 - 1.24  mg/dL   Calcium 7.8 (L) 8.9 - 10.3 mg/dL   GFR, Estimated >13 >08 mL/min   Anion gap 10 5 - 15  Glucose, capillary     Status: Abnormal   Collection Time: 11/22/23  4:56 PM  Result Value Ref Range   Glucose-Capillary 130 (H) 70 - 99 mg/dL   Comment 1 Notify RN    Comment 2 Document in Chart   Glucose, capillary     Status: Abnormal   Collection Time: 11/22/23  8:56 PM  Result Value Ref Range   Glucose-Capillary 309 (H) 70 - 99 mg/dL   Comment 1 Document in Chart   Basic metabolic panel     Status: Abnormal   Collection Time: 11/23/23  2:44 AM  Result Value Ref Range   Sodium 135 135 - 145 mmol/L   Potassium 3.9 3.5 - 5.1 mmol/L   Chloride 96 (L) 98 -  111 mmol/L   CO2 29 22 - 32 mmol/L   Glucose, Bld 128 (H) 70 - 99 mg/dL   BUN 51 (H) 8 - 23 mg/dL   Creatinine, Ser 5.28 (H) 0.61 - 1.24 mg/dL   Calcium 8.0 (L) 8.9 - 10.3 mg/dL   GFR, Estimated 52 (L) >60 mL/min   Anion gap 10 5 - 15  CBC with Differential/Platelet     Status: Abnormal   Collection Time: 11/23/23  2:44 AM  Result Value Ref Range   WBC 17.0 (H) 4.0 - 10.5 K/uL   RBC 5.14 4.22 - 5.81 MIL/uL   Hemoglobin 13.8 13.0 - 17.0 g/dL   HCT 41.3 24.4 - 01.0 %   MCV 87.2 80.0 - 100.0 fL   MCH 26.8 26.0 - 34.0 pg   MCHC 30.8 30.0 - 36.0 g/dL   RDW 27.2 (H) 53.6 - 64.4 %   Platelets 364 150 - 400 K/uL   nRBC 0.0 0.0 - 0.2 %   Neutrophils Relative % 89 %   Neutro Abs 15.2 (H) 1.7 - 7.7 K/uL   Lymphocytes Relative 3 %   Lymphs Abs 0.5 (L) 0.7 - 4.0 K/uL   Monocytes Relative 7 %   Monocytes Absolute 1.1 (H) 0.1 - 1.0 K/uL   Eosinophils Relative 0 %   Eosinophils Absolute 0.0 0.0 - 0.5 K/uL   Basophils Relative 0 %   Basophils Absolute 0.0 0.0 - 0.1 K/uL   Immature Granulocytes 1 %   Abs Immature Granulocytes 0.11 (H) 0.00 - 0.07 K/uL  Magnesium     Status: Abnormal   Collection Time: 11/23/23  2:44 AM  Result Value Ref Range   Magnesium 2.7 (H) 1.7 - 2.4 mg/dL  Glucose, capillary     Status: Abnormal   Collection  Time: 11/23/23  7:44 AM  Result Value Ref Range   Glucose-Capillary 133 (H) 70 - 99 mg/dL  Glucose, capillary     Status: Abnormal   Collection Time: 11/23/23 11:18 AM  Result Value Ref Range   Glucose-Capillary 125 (H) 70 - 99 mg/dL   *Note: Due to a large number of results and/or encounters for the requested time period, some results have not been displayed. A complete set of results can be found in Results Review.    I have reviewed pertinent nursing notes, vitals, labs, and images as necessary. I have ordered labwork to follow up on as indicated.  I have reviewed the last notes from staff over past 24 hours. I have discussed patient's care plan and test results with nursing staff, CM/SW, and other staff as appropriate.  Critical care time to evaluate and treat this patient was 55 minutes.  Independent of separate billable services  This patient is critically ill with the following life-threatening issues requiring my presence at the bedside: Hemodynamic instability requiring titration of medications Oxygenation/ventilation instability requiring frequent modifications of support Cardiac rhythm disturbances requiring evaluation and/or interventions Fluctuations in neurologic function requiring evaluation and/or interventions and/or fluid/volume titration    LOS: 5 days   Lewie Chamber, MD [facility] 11/23/2023, 12:07 PM

## 2023-11-24 ENCOUNTER — Inpatient Hospital Stay (HOSPITAL_COMMUNITY)

## 2023-11-24 DIAGNOSIS — I480 Paroxysmal atrial fibrillation: Secondary | ICD-10-CM | POA: Diagnosis not present

## 2023-11-24 DIAGNOSIS — J9601 Acute respiratory failure with hypoxia: Secondary | ICD-10-CM | POA: Diagnosis not present

## 2023-11-24 DIAGNOSIS — B342 Coronavirus infection, unspecified: Secondary | ICD-10-CM | POA: Diagnosis not present

## 2023-11-24 DIAGNOSIS — J90 Pleural effusion, not elsewhere classified: Secondary | ICD-10-CM | POA: Diagnosis not present

## 2023-11-24 LAB — BASIC METABOLIC PANEL WITH GFR
Anion gap: 11 (ref 5–15)
BUN: 52 mg/dL — ABNORMAL HIGH (ref 8–23)
CO2: 27 mmol/L (ref 22–32)
Calcium: 7.9 mg/dL — ABNORMAL LOW (ref 8.9–10.3)
Chloride: 97 mmol/L — ABNORMAL LOW (ref 98–111)
Creatinine, Ser: 1.24 mg/dL (ref 0.61–1.24)
GFR, Estimated: 56 mL/min — ABNORMAL LOW (ref >60)
Glucose, Bld: 124 mg/dL — ABNORMAL HIGH (ref 70–99)
Potassium: 3.5 mmol/L (ref 3.5–5.1)
Sodium: 135 mmol/L (ref 135–145)

## 2023-11-24 LAB — CBC WITH DIFFERENTIAL/PLATELET
Abs Immature Granulocytes: 0.16 10*3/uL — ABNORMAL HIGH (ref 0.00–0.07)
Basophils Absolute: 0 10*3/uL (ref 0.0–0.1)
Basophils Relative: 0 %
Eosinophils Absolute: 0.1 10*3/uL (ref 0.0–0.5)
Eosinophils Relative: 1 %
HCT: 42.4 % (ref 39.0–52.0)
Hemoglobin: 13.7 g/dL (ref 13.0–17.0)
Immature Granulocytes: 1 %
Lymphocytes Relative: 5 %
Lymphs Abs: 1 10*3/uL (ref 0.7–4.0)
MCH: 27.2 pg (ref 26.0–34.0)
MCHC: 32.3 g/dL (ref 30.0–36.0)
MCV: 84.3 fL (ref 80.0–100.0)
Monocytes Absolute: 1.8 10*3/uL — ABNORMAL HIGH (ref 0.1–1.0)
Monocytes Relative: 8 %
Neutro Abs: 18.6 10*3/uL — ABNORMAL HIGH (ref 1.7–7.7)
Neutrophils Relative %: 85 %
Platelets: 355 10*3/uL (ref 150–400)
RBC: 5.03 MIL/uL (ref 4.22–5.81)
RDW: 18 % — ABNORMAL HIGH (ref 11.5–15.5)
WBC: 21.7 10*3/uL — ABNORMAL HIGH (ref 4.0–10.5)
nRBC: 0 % (ref 0.0–0.2)

## 2023-11-24 LAB — GLUCOSE, CAPILLARY
Glucose-Capillary: 74 mg/dL (ref 70–99)
Glucose-Capillary: 137 mg/dL — ABNORMAL HIGH (ref 70–99)
Glucose-Capillary: 147 mg/dL — ABNORMAL HIGH (ref 70–99)
Glucose-Capillary: 104 mg/dL — ABNORMAL HIGH (ref 70–99)

## 2023-11-24 LAB — MAGNESIUM: Magnesium: 2.7 mg/dL — ABNORMAL HIGH (ref 1.7–2.4)

## 2023-11-24 MED ORDER — LOSARTAN POTASSIUM 25 MG PO TABS
12.5000 mg | ORAL_TABLET | Freq: Every day | ORAL | Status: DC
  Administered 2023-11-24 – 2023-11-27 (×4): 12.5 mg via ORAL
  Filled 2023-11-24: qty 1
  Filled 2023-11-24: qty 0.5
  Filled 2023-11-24 (×2): qty 1

## 2023-11-24 MED ORDER — POTASSIUM CHLORIDE CRYS ER 20 MEQ PO TBCR
40.0000 meq | EXTENDED_RELEASE_TABLET | Freq: Once | ORAL | Status: AC
  Administered 2023-11-24: 40 meq via ORAL
  Filled 2023-11-24: qty 2

## 2023-11-24 MED ORDER — MAGIC MOUTHWASH
5.0000 mL | Freq: Four times a day (QID) | ORAL | Status: DC
  Administered 2023-11-24 – 2023-11-27 (×10): 5 mL via ORAL
  Filled 2023-11-24 (×15): qty 5

## 2023-11-24 MED ORDER — METOPROLOL SUCCINATE ER 50 MG PO TB24
50.0000 mg | ORAL_TABLET | Freq: Every day | ORAL | Status: DC
  Administered 2023-11-25 – 2023-11-27 (×3): 50 mg via ORAL
  Filled 2023-11-24 (×3): qty 1

## 2023-11-24 MED ORDER — METOPROLOL SUCCINATE ER 25 MG PO TB24
25.0000 mg | ORAL_TABLET | Freq: Once | ORAL | Status: AC
  Administered 2023-11-24: 25 mg via ORAL
  Filled 2023-11-24: qty 1

## 2023-11-24 NOTE — Progress Notes (Signed)
 Speech Language Pathology:    Patient Details Name: Justin [name] MRN: [id number] DOB: 08-15-1934 Today's Date: 11/24/2023     SLP spoke with patient in room regarding his swallowing and potential MBS to objectively assess his swallow function. He reports that he has been eating about 1/3 of meal trays and has been trying to eat slower. He endorsed some indigestion last night and points to approximately below sternum to indicate where he has pain/globus sensation. SLP spoke with his daughter, [name] on the phone and she wishes to proceed with MBS. SLP informed patient and he was in agreement as well.   Angela Nevin, MA, CCC-SLP Speech Therapy

## 2023-11-24 NOTE — TOC Progression Note (Addendum)
 Transition of Care [facility]) - Progression Note    Patient Details  Name: Justin [name] MRN: [id number] Date of Birth: [birth year]  [facility] CM/SW Contact  Howell Rucks, RN Phone Number: 11/24/2023, 11:15 AM  Clinical Narrative:  NCM called to pt's dtr Charlyne Quale) to introduce role of TOC/NCM and review for dc planning, [name] confirmed pt  resides at [facility] IL[facility]. PT recommendation for short term rehab/SNF, [name] agreeable, prefers SNF at [facility]. FL2 updated. TOC will continue to follow.    -12:28pm Call to [facility], update provided on patient medical status, remains in ICU with current expected dc date 12/01/2023. Per Annice Pih, patient will admit to short term rehab at [facility] [address]. Will need insurance auth, will wait closer to discharge to initiate. TOC will continue to follow.       Barriers to Discharge: Continued Medical Work up  Expected Discharge Plan and Services In-house Referral: NA     Living arrangements for the past 2 months: Independent Living Facility                 DME Arranged: N/A DME Agency: NA       HH Arranged: NA HH Agency: NA         Social Determinants of Health (SDOH) Interventions SDOH Screenings   Food Insecurity: No Food Insecurity (11/18/2023)  Housing: Low Risk  (11/18/2023)  Transportation Needs: No Transportation Needs (11/18/2023)  Utilities: Not At Risk (11/18/2023)  Alcohol Screen: Low Risk  (03/31/2023)  Depression (PHQ2-9): High Risk (11/13/2023)  Financial Resource Strain: Low Risk  (03/31/2023)  Physical Activity: Sufficiently Active (03/31/2023)  Social Connections: Socially Integrated (11/18/2023)  Stress: No Stress Concern Present (03/31/2023)  Tobacco Use: Medium Risk (11/17/2023)  Health Literacy: Adequate Health Literacy (03/31/2023)    Readmission Risk Interventions    11/19/2023    1:00 PM  Readmission Risk Prevention Plan  Transportation Screening Complete  PCP or  Specialist Appt within 3-5 Days Complete  HRI or Home Care Consult Complete  Social Work Consult for Recovery Care Planning/Counseling Complete  Palliative Care Screening Not Applicable  Medication Review Oceanographer) Complete

## 2023-11-24 NOTE — Assessment & Plan Note (Signed)
-  Cardiology followed during hospitalization, appreciate assistance -Notably, Justin [name] being discontinued due to recurrent UTIs; family also requesting for it to be discontinued and cardiology is aware -Last echo reviewed from 11/18/2023: EF 40 to 45%, indeterminate diastolic function -Cardiology interrogating device; see note -Does not appear grossly volume overloaded and creatinine did bump with persistent diuresis; has improved after stopping Lasix -OptiVol evaluation per cardiology suggested some volume overload and he is resumed back on torsemide

## 2023-11-24 NOTE — [place] FL2 (Signed)
 [place] MEDICAID FL2 LEVEL OF CARE FORM     IDENTIFICATION  Patient Name: Justin [name] Birthdate: June 15, 1934 Sex: male Admission Date (Current Location): 11/17/2023  [facility] and [place] Number:  Producer, television/film/video and Address:  [facility],  [address] [place], [place] [address]      Provider Number: [id number]  Attending Physician Name and Address:  Lewie Chamber, MD  Relative Name and Phone Number:  Desean, Heemstra (Daughter)  [phone] (Mobile)    Current Level of Care: Hospital Recommended Level of Care: Skilled Nursing Facility Prior Approval Number:    Date Approved/Denied:   PASRR Number: [id number]  Discharge Plan: SNF    Current Diagnoses: Patient Active Problem List   Diagnosis Date Noted   Pleural effusion 11/20/2023   Asymptomatic bacteriuria 11/20/2023   Viral pneumonia 11/19/2023   Acute hypoxemic respiratory failure (HCC) 11/18/2023   Coronavirus infection 11/18/2023   Acute respiratory failure with hypoxia (HCC) 11/17/2023   Bronchitis 11/06/2023   Type 2 diabetes mellitus (HCC) 10/23/2023   Leukocytosis 10/16/2023   MDD (major depressive disorder) 10/09/2023   Permanent atrial fibrillation (HCC) 09/01/2023   Right ventricular dysfunction 07/05/2023   Biventricular cardiac pacemaker in situ 07/05/2023   Chronic kidney disease, stage 3a (HCC) 07/05/2023   Persistent atrial fibrillation (HCC) 07/05/2023   Long term (current) use of anticoagulants 07/05/2023   Hx of CABG 07/05/2023   Atherosclerosis of native coronary artery of native heart without angina pectoris 07/05/2023   Acute exacerbation of congestive heart failure (HCC) 06/30/2023   NSVT (nonsustained ventricular tachycardia) (HCC) 05/23/2022   Sinus node dysfunction (HCC) 05/23/2022   Frequent PVCs 05/23/2022   Acute on chronic combined systolic and diastolic heart failure (HCC) 03/12/2022   Biventricular implantable cardioverter-defibrillator (ICD) in situ  03/12/2022   Atypical atrial flutter (HCC) 01/30/2022   Paroxysmal atrial fibrillation (HCC)    Secondary hypercoagulable state (HCC) 04/19/2021   Raynaud's phenomenon without gangrene 07/11/2020   Vertigo 07/11/2020   Loosening of knee joint prosthesis (HCC) 05/18/2019   Cough 04/30/2019   Gastroesophageal reflux disease 04/30/2019   S/P ICD (internal cardiac defibrillator) procedure 05/03/2017   NICM (nonischemic cardiomyopathy) (HCC) 05/02/2017   History of transcatheter aortic valve replacement (TAVR) 03/04/2017   Severe aortic valve stenosis 03/04/2017   Hypokalemia due to loss of potassium with diuresis 01/10/2017   Aortic stenosis 01/10/2017   Systolic heart failure (HCC) 01/10/2017   Chronic congestive heart failure (HCC) 01/07/2017   Hypothyroidism 12/23/2016   Prostate cancer (HCC)    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   Edema of both legs 09/01/2014   Asthma, intermittent 09/01/2014   Epiphora due to insufficient drainage 07/02/2011   Rosacea blepharoconjunctivitis 07/02/2011   Coronary artery disease involving native coronary artery of native heart without angina pectoris    GOUT 09/12/2009   CAD, AUTOLOGOUS BYPASS GRAFT 05/10/2009   MURMUR 05/08/2009   BRUIT 05/08/2009   MUSCLE CRAMPS 12/12/2008   KNEE SPRAIN 09/21/2008   Osteoarthritis 07/07/2008   BPH with urinary obstruction 02/01/2008   LOW BACK PAIN SYNDROME 02/01/2008   TESTOSTERONE DEFICIENCY 07/03/2007   Hyperlipidemia 07/03/2007   Essential hypertension 07/03/2007   MYOCARDIAL INFARCTION, HX OF 07/03/2007   Allergic rhinitis 07/03/2007   History of colonic polyps 07/03/2007   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    Orientation RESPIRATION BLADDER Height & Weight     Self, Time, Situation, Place  O2 Continent, External catheter Weight: 110 kg Height:  5' 6.5" (168.9 cm)  BEHAVIORAL SYMPTOMS/MOOD NEUROLOGICAL  BOWEL NUTRITION STATUS      Continent Diet (2 gm sodium)  AMBULATORY STATUS COMMUNICATION OF NEEDS  Skin   Limited Assist Verbally Other (Comment) (Eccymosis bilateral arms and legs; erythema face and bilateral buttocks)                       Personal Care Assistance Level of Assistance  Bathing, Feeding, Dressing Bathing Assistance: Limited assistance Feeding assistance: Limited assistance Dressing Assistance: Limited assistance     Functional Limitations Info  Sight, Hearing, Speech Sight Info: Impaired Hearing Info: Adequate Speech Info: Adequate    SPECIAL CARE FACTORS FREQUENCY  PT (By licensed PT), OT (By licensed OT)     PT Frequency: 5x/wk OT Frequency: 5x/wk            Contractures Contractures Info: Not present    Additional Factors Info  Code Status, Allergies, Psychotropic Code Status Info: DNR Allergies Info: Peanut-containing Drug Products, Sulfonamide Derivatives, Amlodipine, Eliquis (Apixaban), Lisinopril, Xarelto (Rivaroxaban Psychotropic Info: N/A         Current Medications (11/24/2023):  This is the current hospital active medication list Current Facility-Administered Medications  Medication Dose Route Frequency Provider Last Rate Last Admin   acetaminophen (TYLENOL) tablet 650 mg  650 mg Oral Q6H PRN Alan Mulder, MD       Or   acetaminophen (TYLENOL) suppository 650 mg  650 mg Rectal Q6H PRN Alan Mulder, MD       amiodarone (PACERONE) tablet 200 mg  200 mg Oral Daily Pricilla Riffle, MD   200 mg at 11/24/23 1018   arformoterol (BROVANA) nebulizer solution 15 mcg  15 mcg Nebulization BID Alan Mulder, MD   15 mcg at 11/24/23 1610   aspirin EC tablet 81 mg  81 mg Oral Daily Alan Mulder, MD   81 mg at 11/24/23 1018   atorvastatin (LIPITOR) tablet 20 mg  20 mg Oral Daily Dorrell, Molly Maduro, MD   20 mg at 11/23/23 2244   budesonide (PULMICORT) nebulizer solution 0.25 mg  0.25 mg Nebulization BID Alan Mulder, MD   0.25 mg at 11/24/23 9604   calcium carbonate (TUMS - dosed in mg elemental calcium) chewable tablet 200 mg of elemental  calcium  1 tablet Oral TID PRN Lewie Chamber, MD   200 mg of elemental calcium at 11/24/23 1018   Chlorhexidine Gluconate Cloth 2 % PADS 6 each  6 each Topical Q2200 Lewie Chamber, MD   6 each at 11/23/23 2245   dabigatran (PRADAXA) capsule 150 mg  150 mg Oral BID Lewie Chamber, MD   150 mg at 11/24/23 1019   insulin aspart (novoLOG) injection 0-15 Units  0-15 Units Subcutaneous TID WC Lewie Chamber, MD   2 Units at 11/23/23 1731   insulin aspart (novoLOG) injection 0-5 Units  0-5 Units Subcutaneous QHS Lewie Chamber, MD   4 Units at 11/22/23 2112   levalbuterol (XOPENEX) nebulizer solution 0.63 mg  0.63 mg Nebulization TID Alan Mulder, MD   0.63 mg at 11/24/23 5409   levothyroxine (SYNTHROID) tablet 175 mcg  175 mcg Oral QAC breakfast Lewie Chamber, MD   175 mcg at 11/24/23 0732   losartan (COZAAR) tablet 12.5 mg  12.5 mg Oral Daily Cyndi Bender, NP       metoprolol succinate (TOPROL-XL) 24 hr tablet 25 mg  25 mg Oral Daily Dietrich Pates V, MD   25 mg at 11/24/23 1018   ondansetron (ZOFRAN) tablet 4 mg  4 mg Oral Q6H PRN  Alan Mulder, MD       Or   ondansetron [facility]) injection 4 mg  4 mg Intravenous Q6H PRN Alan Mulder, MD       Oral care mouth rinse  15 mL Mouth Rinse PRN Lewie Chamber, MD       pantoprazole (PROTONIX) EC tablet 40 mg  40 mg Oral Daily Dorrell, Robert, MD   40 mg at 11/24/23 1018   predniSONE (DELTASONE) tablet 40 mg  40 mg Oral Q breakfast Hunsucker, Lesia Sago, MD   40 mg at 11/24/23 0732   venlafaxine XR (EFFEXOR-XR) 24 hr capsule 37.5 mg  37.5 mg Oral Q breakfast Alan Mulder, MD   37.5 mg at 11/24/23 4098     Discharge Medications: Please see discharge summary for a list of discharge medications.  Relevant Imaging Results:  Relevant Lab Results:   Additional Information SSN: [id number]  Howell Rucks, RN

## 2023-11-24 NOTE — Progress Notes (Signed)
 I met with Justin [name] and his 2 daughters to provide emotional support.  Justin [name] has been through many transitions and is able to name that he is grieving multiple losses including the loss of wife's memory and their ability to connect, his eyesight, not working anymore etc. I provided listening as he shared about how these have impacted him.  Before coming to the hospital, he was sleeping 14-15 hours per day because that was "the easiest thing to do."  He and his family are aware that this may be due to the infections already being present in his system, but they acknowledge that grief and depression may be a factor. I explained that grief can take time to heal and shared that I didn't want him to lose mental capacity due to not having much emotional capacity and reserve right now.  I encouraged them to speak with Justin [name]'s provider about whether increasing his depression medication might help. We also spoke about things that might be interesting to him when he is feeling better and has more physical and emotional energy. I will continue to check in on him while he is here in the hospital, but please also page or consult again if needs arise.

## 2023-11-24 NOTE — Progress Notes (Signed)
 Progress Note    Justin [name]   FAO:[id number]  DOB: 1934-06-19  DOA: 11/17/2023     6 PCP: Mast, Man X, NP  Initial CC: SOB  Hospital Course: Justin [name] is an 89 yo male with PMH BPH, CAD s/p CABG, CHF, HTN, HLD, DMII, AS s/p TAVR, PAF, hypothyroidism who presented with shortness of breath.  He was recently seen by primary care and also given abx due to symptoms along with prednisone.  He required BIPAP on admission and became progressively more hypotensive also.  CXR showed asymmetric interstitial opacities concerning for atypical pneumonia versus pulmonary edema or both. RVP swab positive for coronavirus OC 43.  He also became more hypoxic requiring salter high flow. WBC elevated at 29.7 on admission and he was also started on antibiotics. Other notable labs included elevated BNP, 880 and he was given lasix as well.   Interval History:  No events overnight.  Did have a little bit of GERD but treated with Tums.  Otherwise oxygen requirements are continuing to improve each day.  Assessment and Plan: * Acute respiratory failure with hypoxia (HCC) - Initially presumed due to viral pna (see below); given slow improvement, other differentials have been considered including volume overload versus amiodarone toxicity versus viral lung injury - requiring HF; not on O2 at baseline at home; wean as able - steroids added 3/25; taper planned  Pleural effusion - moderate left pleural effusion since admission; underwent thoracentesis with PCCM on 3/26 - follow fluid culture (remains negative); pathology negative too for malignant cells  Coronavirus infection - RVP positive for coronavirus OC43 (non-covid) - CXR looks like diffuse GGO and he was very hypoxic and hypotensive on admission -CT chest obtained on 11/19/2023 showing significant involvement of right lung, sparing left lung for the most part but does have moderate-sized left pleural effusion - other considered differential per  pulm was possible amio toxicity but I agree, would expect a bilateral effect rather than unilateral as being seen on CT chest  - continue nebs - added steroids given profound hypoxia; taper now planned - continue droplet precautions  -Diuresing intermittently with Lasix -Completed 5 days of Rocephin and doxycycline empirically  Hypothyroidism - Continue Synthroid -TSH suppressed, 0.169; was normal 10/14/23 but elevated 06/30/23. Also had mildly elevated FT4 on 06/30/23 at 1.21 but now more elevated on admission at 1.68 (dose has not changed on looking at fill history) - will decrease from 200 mcg daily to 175 mcg daily (correlates more with weight based dose too) and needs repeat TSH and FT4 in about 4-6 weeks  Asymptomatic bacteriuria - urine culture noted with ecoli esbl but UA with 0-5 WBC and patient asymptomatic - I would favor not treating this organism as doesn't appear infectious  Chronic kidney disease, stage 3a (HCC) - patient has history of CKD3a. Baseline creat ~ 1.3 - 1.4, eGFR~ 50 - at baseline  -Monitor while on diuresis  Chronic systolic CHF (congestive heart failure) (HCC) -Cardiology followed during hospitalization, appreciate assistance -Notably, Marcelline Deist being discontinued due to recurrent UTIs; family also requesting for it to be discontinued and cardiology is aware -Last echo reviewed from 11/18/2023: EF 40 to 45%, indeterminate diastolic function -Cardiology interrogating device; see note -Does not appear grossly volume overloaded and creatinine did bump with persistent diuresis; has improved after stopping Lasix  Frequent PVCs - started on Toprol per cardiology on 3/28 - still having PVCs with elevated HR intermittently   Type 2 diabetes mellitus (HCC) - A1c 6.8 %  on 10/14/23 - continue on SSI and CBGs  MDD (major depressive disorder) - Continue Effexor; recently started about 3 to 4 weeks ago at friend's home; he will undergo follow-up upon discharge and dosage  adjustment at that time -Discussed with patient and his daughters bedside, they are okay continuing on current dose  Paroxysmal atrial fibrillation (HCC) - Continue Pradaxa and amiodarone  Gastroesophageal reflux disease - Continue Protonix  Essential hypertension - Significant hypotension on admission borderline requiring pressors; antihypertensives on hold  Hyperlipidemia - Continue Lipitor   Old records reviewed in assessment of this patient  Antimicrobials: Rocephin 11/18/2023 >> 3/30 Doxycycline 11/18/2023 >> 3/29  DVT prophylaxis:  SCDs Start: 11/17/23 1536 dabigatran (PRADAXA) capsule 150 mg   Code Status:   Code Status: Limited: Do not attempt resuscitation (DNR) -DNR-LIMITED -Do Not Intubate/DNI   Mobility Assessment (Last 72 Hours)     Mobility Assessment     Row Name 11/24/23 0730 11/23/23 2000 11/23/23 1300       What is the highest level of mobility based on the progressive mobility assessment? Level 4 (Walks with assist in room) - Balance while marching in place and cannot step forward and back - Complete Level 4 (Walks with assist in room) - Balance while marching in place and cannot step forward and back - Complete Level 4 (Walks with assist in room) - Balance while marching in place and cannot step forward and back - Complete              Barriers to discharge: None Disposition Plan: Home HH orders placed:  Status is: Inpatient  Objective: Blood pressure 124/84, pulse 89, temperature 97.9 F (36.6 C), temperature source Axillary, resp. rate 19, height 5' 6.5" (1.689 m), weight 110 kg, SpO2 97%.  Examination:  Physical Exam Constitutional:      General: He is not in acute distress.    Appearance: Normal appearance.  HENT:     Head: Normocephalic and atraumatic.     Mouth/Throat:     Mouth: Mucous membranes are moist.  Eyes:     Extraocular Movements: Extraocular movements intact.  Cardiovascular:     Rate and Rhythm: Normal rate and regular  rhythm.  Pulmonary:     Effort: Pulmonary effort is normal. No respiratory distress.     Breath sounds: Rhonchi (Worse on right) present. No wheezing.  Abdominal:     General: Bowel sounds are normal. There is no distension.     Palpations: Abdomen is soft.     Tenderness: There is no abdominal tenderness.  Musculoskeletal:        General: Normal range of motion.     Cervical back: Normal range of motion and neck supple.     Right lower leg: No edema.     Left lower leg: No edema.  Skin:    General: Skin is warm and dry.  Neurological:     General: No focal deficit present.     Mental Status: He is alert.  Psychiatric:        Mood and Affect: Mood normal.        Behavior: Behavior normal.      Consultants:  Pulmonology Cardiology  Procedures:  11/19/2023: Left thoracentesis  Data Reviewed: Results for orders placed or performed during the hospital encounter of 11/17/23 (from the past 24 hours)  Glucose, capillary     Status: Abnormal   Collection Time: 11/23/23  4:45 PM  Result Value Ref Range   Glucose-Capillary 137 (H) 70 -  99 mg/dL  Glucose, capillary     Status: Abnormal   Collection Time: 11/23/23  9:33 PM  Result Value Ref Range   Glucose-Capillary 175 (H) 70 - 99 mg/dL  Basic metabolic panel with GFR     Status: Abnormal   Collection Time: 11/24/23  2:42 AM  Result Value Ref Range   Sodium 135 135 - 145 mmol/L   Potassium 3.5 3.5 - 5.1 mmol/L   Chloride 97 (L) 98 - 111 mmol/L   CO2 27 22 - 32 mmol/L   Glucose, Bld 124 (H) 70 - 99 mg/dL   BUN 52 (H) 8 - 23 mg/dL   Creatinine, Ser 1.61 0.61 - 1.24 mg/dL   Calcium 7.9 (L) 8.9 - 10.3 mg/dL   GFR, Estimated 56 (L) >60 mL/min   Anion gap 11 5 - 15  CBC with Differential/Platelet     Status: Abnormal   Collection Time: 11/24/23  2:42 AM  Result Value Ref Range   WBC 21.7 (H) 4.0 - 10.5 K/uL   RBC 5.03 4.22 - 5.81 MIL/uL   Hemoglobin 13.7 13.0 - 17.0 g/dL   HCT 09.6 04.5 - 40.9 %   MCV 84.3 80.0 - 100.0 fL    MCH 27.2 26.0 - 34.0 pg   MCHC 32.3 30.0 - 36.0 g/dL   RDW 81.1 (H) 91.4 - 78.2 %   Platelets 355 150 - 400 K/uL   nRBC 0.0 0.0 - 0.2 %   Neutrophils Relative % 85 %   Neutro Abs 18.6 (H) 1.7 - 7.7 K/uL   Lymphocytes Relative 5 %   Lymphs Abs 1.0 0.7 - 4.0 K/uL   Monocytes Relative 8 %   Monocytes Absolute 1.8 (H) 0.1 - 1.0 K/uL   Eosinophils Relative 1 %   Eosinophils Absolute 0.1 0.0 - 0.5 K/uL   Basophils Relative 0 %   Basophils Absolute 0.0 0.0 - 0.1 K/uL   Immature Granulocytes 1 %   Abs Immature Granulocytes 0.16 (H) 0.00 - 0.07 K/uL  Magnesium     Status: Abnormal   Collection Time: 11/24/23  2:42 AM  Result Value Ref Range   Magnesium 2.7 (H) 1.7 - 2.4 mg/dL  Glucose, capillary     Status: None   Collection Time: 11/24/23  8:09 AM  Result Value Ref Range   Glucose-Capillary 74 70 - 99 mg/dL   Comment 1 Notify RN    Comment 2 Document in Chart   Glucose, capillary     Status: Abnormal   Collection Time: 11/24/23 12:38 PM  Result Value Ref Range   Glucose-Capillary 137 (H) 70 - 99 mg/dL   Comment 1 Notify RN    Comment 2 Document in Chart    *Note: Due to a large number of results and/or encounters for the requested time period, some results have not been displayed. A complete set of results can be found in Results Review.    I have reviewed pertinent nursing notes, vitals, labs, and images as necessary. I have ordered labwork to follow up on as indicated.  I have reviewed the last notes from staff over past 24 hours. I have discussed patient's care plan and test results with nursing staff, CM/SW, and other staff as appropriate.  Critical care time to evaluate and treat this patient was 55 minutes.  Independent of separate billable services  This patient is critically ill with the following life-threatening issues requiring my presence at the bedside: Hemodynamic instability requiring titration of medications Oxygenation/ventilation instability requiring  frequent modifications of support Cardiac rhythm disturbances requiring evaluation and/or interventions Fluctuations in neurologic function requiring evaluation and/or interventions and/or fluid/volume titration    LOS: 6 days   Lewie Chamber, MD [facility] 11/24/2023, 2:00 PM

## 2023-11-24 NOTE — Procedures (Signed)
 Modified Barium Swallow Study  Patient Details  Name: DUVAL MACLEOD MRN: [id number] Date of Birth: 04-Aug-1934  Today's Date: 11/24/2023  Modified Barium Swallow completed.  Full report located under Chart Review in the Imaging Section.  History of Present Illness 89 yo presenting 3/24 with shortness of breath. CXR showed asymmetric interstitial opacities concerning for atypical pneumonia versus pulmonary edema or both. RVP swab positive for coronavirus OC 43.  He also became more hypoxic requiring salter high flow. PMH includes: GERD, BPH, CAD s/p CABG, CHF, HTN, HLD, DMII, AS s/p TAVR, PAF, hypothyroidism   Clinical Impression Patient presents with an oropharyngeal and an esophageal dysphagia as per this MBS. Mastication and oral transit of solid and puree boluses was [facility]. Swallow was initiated at level of vallecular sinus with puree and mechanical soft solids and honey thick liquids, initiated at level of posterior laryngeal surface of the epiglottis with nectar thick liquids and delayed at level of pyriform sinus with thin liquids. Anterior hyoid excursion, laryngeal elevation both partial in completion. With small, single sips of thin liquids, no aspiration observed but with consecutive sips, patient with sensed aspiration (PAS 7) with thin liquids secondary to inadequate and mistimed closure of laryngeal vestibule. Chin tuck posture was not beneficial to reduce aspiration risk or amount. Esophageal sweep revealed retrograde movement and stasis of barium in distal esophagus. SLP educated patient on results of this MBS, recommendation for small, single cup sips of thin liquids and not straws, no consecutive cup sips of thin liquids. SLP also recommending management of patient's GERD and for him to follow general GERD/Reflux precautions such as not laying down flat after eating, sitting upright and remaining upright for 45 minutes after meals. Patient is at risk for post-prandial aspiration. SLP  will continue to follow. Factors that may increase risk of adverse event in presence of aspiration [employer] 2021): Respiratory or GI disease;Poor general health and/or compromised immunity;Frail or deconditioned  Swallow Evaluation Recommendations Recommendations: PO diet PO Diet Recommendation: Regular;Thin liquids (Level 0) Liquid Administration via: No straw;Cup Medication Administration: Whole meds with puree Supervision: Patient able to self-feed Swallowing strategies  : Slow rate;Small bites/sips Postural changes: Position pt fully upright for meals;Stay upright 30-60 min after meals Oral care recommendations: Oral care BID (2x/day)      Angela Nevin, MA, CCC-SLP Speech Therapy

## 2023-11-24 NOTE — Progress Notes (Signed)
 Heart Failure Navigator Progress Note  Assessed for Heart & Vascular TOC clinic readiness.  Patient does not meet criteria due to plan at discharge for outpatient EP evaluation and SNF for rehab. .   Navigator will sign off at this time.    Rhae Hammock, BSN, Scientist, clinical (histocompatibility and immunogenetics) Only

## 2023-11-24 NOTE — Progress Notes (Addendum)
 Patient Name: Justin [name] Date of Encounter: 11/24/2023 [facility] Cardiologist: Verne Carrow, MD   Interval Summary  .    89 y.o. 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%, LBBB, PVCs, CRT-D 2018 (gen change 2024), CKD, HTN, HLD, DM2, hypothyroidism, BPH, who is admitted for acute hypoxic respiratory failure due to pneumonia, coronavirus infection, pleural effusion requiring thoracentesis, CHF require diuresis, cardiology was consulted 11/20/23 for concern of amiodarone lung toxicity, this was felt unlikely given his significant improvement of respiratory status after thoracentesis and diuresis, as well as asymmetric CT finding, amiodarone was initially held and now resumed at 200mg  daily for PVC suppression, plan is for further EP evaluation outpatient and repeat CT chest after acute treatment.    Patient feels he is getting better, able to ambulate some in the room yesterday, had some indigestion last night and got better with Tums, no chest pain, palpitation.   Vital Signs .    Vitals:   11/24/23 0400 11/24/23 0800 11/24/23 0831 11/24/23 0834  BP: (!) 110/59 (!) 120/102    Pulse: 87 (!) 102    Resp: (!) 22 19    Temp: 97.6 F (36.4 C)     TempSrc: Axillary     SpO2: 92% 97% 95% 98%  Weight:      Height:        Intake/Output Summary (Last 24 hours) at 11/24/2023 0838 Last data filed at 11/24/2023 0730 Gross per 24 hour  Intake 240 ml  Output 1450 ml  Net -1210 ml      11/21/2023   10:24 PM 11/20/2023   10:42 PM 11/17/2023   10:44 PM  Last 3 Weights  Weight (lbs) 242 lb 8.1 oz 239 lb 13.8 oz 246 lb 0.5 oz  Weight (kg) 110 kg 108.8 kg 111.6 kg      Telemetry/ECG    V paced, 90s, frequent PVCs  - Personally Reviewed  Physical Exam .   GEN: No acute distress.   Neck: No JVD Cardiac: Irregular,  no murmurs, rubs, or  gallops.  Respiratory: Clear but diminished at base to auscultation bilaterally. On 4LNC.  GI: Soft, nontender, non-distended  MS: Trace edema of LLE, no edema of RLE   Assessment & Plan .     Acute hypoxic respiratory failure  Pneumonia Coronavirus OC43 infection  Acute HFmrEF  Pleural effusion  Questionable amiodarone lung toxicity  - presented with hypoxic respiratory failure, multifactorial as mentioned in diagnosis above, wean from BIPAP to HFNC to [place] now  - CT 3/26 showed Extensive airspace disease throughout the right lung compatible with pneumonia. Moderate sized left pleural effusion with compressive atelectasis in the left lower lobe. - s/p antibiotic, steroid, thoracentesis, IV lasix per primary team, clinical exam slowly improving , repeat CXR 3/27 showed Minimal left basilar subsegmental atelectasis and left pleural effusion. Stable mild right diffuse lung opacities - Dr Tenny Craw had reviewed with EP team, given relative quick improvement of respiratory exam and asymmetric CT finding in the setting of infection, amiodarone lung toxicity was felt less likely, recommend to continue amiodarone for PVCs suppression, repeat CT chest in a month to re-evaluate upon resolution of infection, and EP follow up outpatient for alternative if any  - GDMT for HFmrEF: IV Lasix 40mg  Q6H stopped on 11/23/23, net -9L since admission, weight is down from 246 to 242  ib, currently on Toprol XL 25mg  daily, on PTA torsemide 40mg  every other day, metolazone 2.5mg  weekly,  spironolactone 25mg  daily, toprol XL 100mg  daily, losartan 12.5mg  daily, Farxiga 10mg  daily, Imdur 30mg  daily; all held due to hypotension so far; BP improving, AKI resolved, will add back losartan 12.5mg  daily today, will avoid  SGLT2I given hx of UTIs - will repeat device interrogation today to evaluate fluid status, pending result will determine loop diuretic dose, clinically he is not overt fluid overloaded today   Permanent A  fib LBBB/IVCD Frequent PVCs and NSVT on amiodarone for suppression  S/p CRT-D 04/2017 due to significant conduction disease - previous device interrogation 10/24/23 showed 100% A fib burden, BiV pacing 75.8% of the time, Estimated longevity 7.3 yrs, increased fluid status  - last seen by EP Dr. Graciela Husbands 10/24/23 , felt high PVC burden affecting with pacing, recommend amiodarone for suppression, had subsequent improved CRT pacing 60>>75% - continue Toprol XL 25mg  daily and amiodarone 200mg  daily  - continue Pradaxa 150mg  BID - EP follow up outpatient   CAD with hx of CABG x4 2007 - no acute issue - continue PTA ASA and lipitor   Hx of aortic stenosis s/p TAVR 2018  - Echo 3/25 LVEF 40-45% stable, Abnormal wall motion adjacent to RV pacing  lead. Mod reduced RV. Mod RV enlargement. Severe LAE and RAE.Large pleural effusion in the left lateral region. Mild to Mod TR. normal structure and function of the aortic valve prosthesis. Aortic valve mean gradient measures 5.0 mmHg.    Type 2 DM HTN  GERD Depression HLD - per primary team   For questions or updates, please contact [facility] Please consult [email or web address] for contact info under        Signed, Cyndi Bender, NP   Patient seen and examined with [facility] NP.  Agree as above, with the following exceptions and changes as noted below. Optivol reviewed, mildly above threshold. Gen: NAD, CV: Irregular no murmurs lungs: Coarse bilaterally, Abd: soft, Extrem: Warm, no edema, Neuro/Psych: alert and oriented x 3, normal mood and affect, notes decreased vision due to macular degeneration. All available labs, radiology testing, previous records reviewed.  Overall patient is slowly improving.  OptiVol suggests volume may be slightly elevated still, reasonable to continue his torsemide.  Overall stable with frequent PVCs on oral amiodarone.  PVC burden and BiV pacing can be addressed as outpatient when patient has recovered from acute illness.   Concern raised about elevated heart rates, okay to uptitrate metoprolol today, patient's home dose is 100 mg, we will increase to 50 mg daily.  Parke Poisson, MD 11/24/23 3:42 PM

## 2023-11-25 DIAGNOSIS — B342 Coronavirus infection, unspecified: Secondary | ICD-10-CM | POA: Diagnosis not present

## 2023-11-25 DIAGNOSIS — J90 Pleural effusion, not elsewhere classified: Secondary | ICD-10-CM | POA: Diagnosis not present

## 2023-11-25 DIAGNOSIS — E039 Hypothyroidism, unspecified: Secondary | ICD-10-CM | POA: Diagnosis not present

## 2023-11-25 DIAGNOSIS — J9601 Acute respiratory failure with hypoxia: Secondary | ICD-10-CM | POA: Diagnosis not present

## 2023-11-25 LAB — CBC WITH DIFFERENTIAL/PLATELET
Abs Immature Granulocytes: 0.18 10*3/uL — ABNORMAL HIGH (ref 0.00–0.07)
Basophils Absolute: 0 10*3/uL (ref 0.0–0.1)
Basophils Relative: 0 %
Eosinophils Absolute: 0.3 10*3/uL (ref 0.0–0.5)
Eosinophils Relative: 2 %
HCT: 41.5 % (ref 39.0–52.0)
Hemoglobin: 13.4 g/dL (ref 13.0–17.0)
Immature Granulocytes: 1 %
Lymphocytes Relative: 6 %
Lymphs Abs: 1.2 10*3/uL (ref 0.7–4.0)
MCH: 28 pg (ref 26.0–34.0)
MCHC: 32.3 g/dL (ref 30.0–36.0)
MCV: 86.6 fL (ref 80.0–100.0)
Monocytes Absolute: 1.5 10*3/uL — ABNORMAL HIGH (ref 0.1–1.0)
Monocytes Relative: 8 %
Neutro Abs: 16.4 10*3/uL — ABNORMAL HIGH (ref 1.7–7.7)
Neutrophils Relative %: 83 %
Platelets: 365 10*3/uL (ref 150–400)
RBC: 4.79 MIL/uL (ref 4.22–5.81)
RDW: 18 % — ABNORMAL HIGH (ref 11.5–15.5)
WBC: 19.5 10*3/uL — ABNORMAL HIGH (ref 4.0–10.5)
nRBC: 0 % (ref 0.0–0.2)

## 2023-11-25 LAB — BASIC METABOLIC PANEL WITH GFR
Anion gap: 9 (ref 5–15)
BUN: 41 mg/dL — ABNORMAL HIGH (ref 8–23)
CO2: 27 mmol/L (ref 22–32)
Calcium: 7.8 mg/dL — ABNORMAL LOW (ref 8.9–10.3)
Chloride: 98 mmol/L (ref 98–111)
Creatinine, Ser: 1.13 mg/dL (ref 0.61–1.24)
GFR, Estimated: >60 mL/min (ref >60)
Glucose, Bld: 87 mg/dL (ref 70–99)
Potassium: 4 mmol/L (ref 3.5–5.1)
Sodium: 134 mmol/L — ABNORMAL LOW (ref 135–145)

## 2023-11-25 LAB — GLUCOSE, CAPILLARY
Glucose-Capillary: 81 mg/dL (ref 70–99)
Glucose-Capillary: 96 mg/dL (ref 70–99)
Glucose-Capillary: 114 mg/dL — ABNORMAL HIGH (ref 70–99)
Glucose-Capillary: 216 mg/dL — ABNORMAL HIGH (ref 70–99)

## 2023-11-25 LAB — MAGNESIUM: Magnesium: 2.7 mg/dL — ABNORMAL HIGH (ref 1.7–2.4)

## 2023-11-25 MED ORDER — TORSEMIDE 20 MG PO TABS
20.0000 mg | ORAL_TABLET | Freq: Every day | ORAL | Status: DC
Start: 2023-11-25 — End: 2023-11-27
  Administered 2023-11-25 – 2023-11-27 (×3): 20 mg via ORAL
  Filled 2023-11-25 (×3): qty 1

## 2023-11-25 MED ORDER — PREDNISONE 20 MG PO TABS: 40.0000 mg | ORAL_TABLET | Freq: Every day | ORAL | Status: DC

## 2023-11-25 MED ORDER — AMIODARONE HCL 200 MG PO TABS
200.0000 mg | ORAL_TABLET | Freq: Every day | ORAL | Status: DC
Start: 2023-11-25 — End: 2024-01-14

## 2023-11-25 MED ORDER — METOPROLOL SUCCINATE ER 50 MG PO TB24
50.0000 mg | ORAL_TABLET | Freq: Every day | ORAL | Status: DC
Start: 2023-11-25 — End: 2024-04-05

## 2023-11-25 NOTE — Progress Notes (Signed)
 Progress Note    Justin [name]   ZOX:[id number]  DOB: 06/14/1934  DOA: 11/17/2023     7 PCP: Mast, Man X, NP  Initial CC: SOB  Hospital Course: Justin [name] is an 89 yo male with PMH BPH, CAD s/p CABG, CHF, HTN, HLD, DMII, AS s/p TAVR, PAF, hypothyroidism who presented with shortness of breath.  He was recently seen by primary care and also given abx due to symptoms along with prednisone.  He required BIPAP on admission and became progressively more hypotensive also.  CXR showed asymmetric interstitial opacities concerning for atypical pneumonia versus pulmonary edema or both. RVP swab positive for coronavirus OC 43.  He also became more hypoxic requiring salter high flow. WBC elevated at 29.7 on admission and he was also started on antibiotics. Other notable labs included elevated BNP, 880 and he was given lasix as well.   Interval History:  No events overnight.  Still continues to improve.  Down to 3 L this morning.  We discussed tentative plans for discharge to SNF on Thursday at this rate.  Assessment and Plan: * Acute respiratory failure with hypoxia (HCC) - Initially presumed due to viral pna (see below); given slow improvement, other differentials have been considered including volume overload versus amiodarone toxicity versus viral lung injury - overall responded to time, steroids, and some diuresis; suspect severe viral inflammation/cytokine storm and has slowly been improving - at one point was on maxed out optiflow almost - steroids added 3/25; steroid taper planned with prednisone -Continue weaning oxygen, currently down to 3 L  Pleural effusion - moderate left pleural effusion since admission; underwent thoracentesis with PCCM on 3/26 - follow fluid culture (remains negative); pathology negative too for malignant cells  Coronavirus infection - RVP positive for coronavirus OC43 (non-covid) - CXR looks like diffuse GGO and he was very hypoxic and hypotensive on  admission -CT chest obtained on 11/19/2023 showing significant involvement of right lung, sparing left lung for the most part but does have moderate-sized left pleural effusion - other considered differential per pulm was possible amio toxicity but I agree, would expect a bilateral effect rather than unilateral as being seen on CT chest  - continue nebs - added steroids given profound hypoxia; taper now planned - continue droplet precautions  -Diuresing intermittently with Lasix -Completed 5 days of Rocephin and doxycycline empirically  Hypothyroidism - Continue Synthroid -TSH suppressed, 0.169; was normal 10/14/23 but elevated 06/30/23. Also had mildly elevated FT4 on 06/30/23 at 1.21 but now more elevated on admission at 1.68 (dose has not changed on looking at fill history) - will decrease from 200 mcg daily to 175 mcg daily (correlates more with weight based dose too) and needs repeat TSH and FT4 in about 4-6 weeks  Asymptomatic bacteriuria - urine culture noted with ecoli esbl but UA with 0-5 WBC and patient asymptomatic - I would favor not treating this organism as doesn't appear infectious  Chronic kidney disease, stage 3a (HCC) - patient has history of CKD3a. Baseline creat ~ 1.3 - 1.4, eGFR~ 50 - at baseline  -Monitor while on diuresis  Chronic systolic CHF (congestive heart failure) (HCC) -Cardiology followed during hospitalization, appreciate assistance -Notably, Marcelline Deist being discontinued due to recurrent UTIs; family also requesting for it to be discontinued and cardiology is aware -Last echo reviewed from 11/18/2023: EF 40 to 45%, indeterminate diastolic function -Cardiology interrogating device; see note -Does not appear grossly volume overloaded and creatinine did bump with persistent diuresis; has improved after stopping  Lasix -OptiVol evaluation per cardiology suggested some volume overload and he is resumed back on torsemide  Frequent PVCs - still having PVCs with  elevated HR intermittently - Toprol further adjusted per cardiology  Type 2 diabetes mellitus (HCC) - A1c 6.8 % on 10/14/23 - continue on SSI and CBGs  MDD (major depressive disorder) - Continue Effexor; recently started about 3 to 4 weeks ago at friend's home; he will undergo follow-up upon discharge and dosage adjustment at that time -Discussed with patient and his daughters bedside, they are okay continuing on current dose  Paroxysmal atrial fibrillation (HCC) - Continue Pradaxa and amiodarone  Gastroesophageal reflux disease - Continue Protonix  Essential hypertension - Significant hypotension on admission borderline requiring pressors; antihypertensives on hold  Hyperlipidemia - Continue Lipitor   Old records reviewed in assessment of this patient  Antimicrobials: Rocephin 11/18/2023 >> 3/30 Doxycycline 11/18/2023 >> 3/29  DVT prophylaxis:  SCDs Start: 11/17/23 1536 dabigatran (PRADAXA) capsule 150 mg   Code Status:   Code Status: Limited: Do not attempt resuscitation (DNR) -DNR-LIMITED -Do Not Intubate/DNI   Mobility Assessment (Last 72 Hours)     Mobility Assessment     Row Name 11/25/23 0800 11/24/23 2015 11/24/23 0730 11/23/23 2000 11/23/23 1300   Does patient have an order for bedrest or is patient medically unstable No - Continue assessment No - Continue assessment -- -- --   What is the highest level of mobility based on the progressive mobility assessment? Level 3 (Stands with assist) - Balance while standing  and cannot march in place Level 4 (Walks with assist in room) - Balance while marching in place and cannot step forward and back - Complete Level 4 (Walks with assist in room) - Balance while marching in place and cannot step forward and back - Complete Level 4 (Walks with assist in room) - Balance while marching in place and cannot step forward and back - Complete Level 4 (Walks with assist in room) - Balance while marching in place and cannot step forward  and back - Complete   Is the above level different from baseline mobility prior to current illness? Yes - Recommend PT order -- -- -- --            Barriers to discharge: None Disposition Plan: SNF HH orders placed:  Status is: Inpatient  Objective: Blood pressure 104/69, pulse 83, temperature 97.8 F (36.6 C), temperature source Oral, resp. rate 18, height 5' 6.5" (1.689 m), weight 110 kg, SpO2 94%.  Examination:  Physical Exam Constitutional:      General: He is not in acute distress.    Appearance: Normal appearance.  HENT:     Head: Normocephalic and atraumatic.     Mouth/Throat:     Mouth: Mucous membranes are moist.  Eyes:     Extraocular Movements: Extraocular movements intact.  Cardiovascular:     Rate and Rhythm: Normal rate and regular rhythm.  Pulmonary:     Effort: Pulmonary effort is normal. No respiratory distress.     Breath sounds: Rhonchi (Worse on right but greatly improved) present. No wheezing.  Abdominal:     General: Bowel sounds are normal. There is no distension.     Palpations: Abdomen is soft.     Tenderness: There is no abdominal tenderness.  Musculoskeletal:        General: Normal range of motion.     Cervical back: Normal range of motion and neck supple.     Right lower leg: No  edema.     Left lower leg: No edema.  Skin:    General: Skin is warm and dry.  Neurological:     General: No focal deficit present.     Mental Status: He is alert.  Psychiatric:        Mood and Affect: Mood normal.        Behavior: Behavior normal.      Consultants:  Pulmonology Cardiology  Procedures:  11/19/2023: Left thoracentesis  Data Reviewed: Results for orders placed or performed during the hospital encounter of 11/17/23 (from the past 24 hours)  Glucose, capillary     Status: Abnormal   Collection Time: 11/24/23  4:37 PM  Result Value Ref Range   Glucose-Capillary 147 (H) 70 - 99 mg/dL  Glucose, capillary     Status: Abnormal   Collection  Time: 11/24/23  8:55 PM  Result Value Ref Range   Glucose-Capillary 104 (H) 70 - 99 mg/dL  CBC with Differential/Platelet     Status: Abnormal   Collection Time: 11/25/23  4:22 AM  Result Value Ref Range   WBC 19.5 (H) 4.0 - 10.5 K/uL   RBC 4.79 4.22 - 5.81 MIL/uL   Hemoglobin 13.4 13.0 - 17.0 g/dL   HCT 16.1 09.6 - 04.5 %   MCV 86.6 80.0 - 100.0 fL   MCH 28.0 26.0 - 34.0 pg   MCHC 32.3 30.0 - 36.0 g/dL   RDW 40.9 (H) 81.1 - 91.4 %   Platelets 365 150 - 400 K/uL   nRBC 0.0 0.0 - 0.2 %   Neutrophils Relative % 83 %   Neutro Abs 16.4 (H) 1.7 - 7.7 K/uL   Lymphocytes Relative 6 %   Lymphs Abs 1.2 0.7 - 4.0 K/uL   Monocytes Relative 8 %   Monocytes Absolute 1.5 (H) 0.1 - 1.0 K/uL   Eosinophils Relative 2 %   Eosinophils Absolute 0.3 0.0 - 0.5 K/uL   Basophils Relative 0 %   Basophils Absolute 0.0 0.0 - 0.1 K/uL   Immature Granulocytes 1 %   Abs Immature Granulocytes 0.18 (H) 0.00 - 0.07 K/uL  Basic metabolic panel with GFR     Status: Abnormal   Collection Time: 11/25/23  7:28 AM  Result Value Ref Range   Sodium 134 (L) 135 - 145 mmol/L   Potassium 4.0 3.5 - 5.1 mmol/L   Chloride 98 98 - 111 mmol/L   CO2 27 22 - 32 mmol/L   Glucose, Bld 87 70 - 99 mg/dL   BUN 41 (H) 8 - 23 mg/dL   Creatinine, Ser 7.82 0.61 - 1.24 mg/dL   Calcium 7.8 (L) 8.9 - 10.3 mg/dL   GFR, Estimated >95 >62 mL/min   Anion gap 9 5 - 15  Magnesium     Status: Abnormal   Collection Time: 11/25/23  7:28 AM  Result Value Ref Range   Magnesium 2.7 (H) 1.7 - 2.4 mg/dL  Glucose, capillary     Status: None   Collection Time: 11/25/23  8:16 AM  Result Value Ref Range   Glucose-Capillary 81 70 - 99 mg/dL  Glucose, capillary     Status: None   Collection Time: 11/25/23 11:30 AM  Result Value Ref Range   Glucose-Capillary 96 70 - 99 mg/dL   *Note: Due to a large number of results and/or encounters for the requested time period, some results have not been displayed. A complete set of results can be found in  Results Review.    I  have reviewed pertinent nursing notes, vitals, labs, and images as necessary. I have ordered labwork to follow up on as indicated.  I have reviewed the last notes from staff over past 24 hours. I have discussed patient's care plan and test results with nursing staff, CM/SW, and other staff as appropriate.  Time spent: Greater than 50% of the 55 minute visit was spent in counseling/coordination of care for the patient as laid out in the A&P.    LOS: 7 days   Lewie Chamber, MD [facility] 11/25/2023, 3:15 PM

## 2023-11-25 NOTE — Progress Notes (Signed)
 This was a follow up visit for Justin [name] to provide emotional support.  He has been reflecting on our conversation yesterday around grief and coming to terms with his new normal. He feels that once he is feeling better physically, he will be able to be more active in his senior community. He was appreciative of the visit.

## 2023-11-25 NOTE — Consult Note (Signed)
 [name] Liaison Consult Note   11/25/2023  Justin [name] 26-Nov-1933 [id number]  Insurance: [employer]  Primary Care Provider: Mast, Man X, NP with [facility], this provider is listed for the transition of care follow up appointments  and Community [facility] calls   [facility] Liaison screened the patient remotely at [facility].   [name] is an 89 yo male was screened for LOS 7-day  hospitalization with noted high risk score for unplanned readmission risk 2 hospital admissions in 6 months. Patient admitted with Acute respiratory failure with hypoxia with Pleural Effusion and Coronavirus infection.  The patient was assessed for potential [facility] Coordination service needs for post hospital transition for care coordination. Review of patient's electronic medical record reveals patient is from [facility] ILF and is being recommended for SNF and likely to transition to [facility] SNF for rehab.   Plan: [facility] Liaison will continue to follow progress and disposition to assess for post hospital community care coordination/management needs.  Referral request for community care coordination: None, If disposition is for SNF then post hospital community follow up with be at a SNF level of care.   [facility] Community [facility], Population Health does not replace or interfere with any arrangements made by the Inpatient Transition of Care team.   For questions contact:   Charlesetta Shanks, RN, BSN, CCM [place]  [facility] Liaison Direct Dial: [phone] or secure chat Email: [email or web address]

## 2023-11-25 NOTE — Progress Notes (Signed)
 Pt seen and given his scheduled neb treatments which he tolerated well.  HR83, RR20, SPO2 97% on 3l [place].  No increased wob / respiratory distress noted or voiced by patient.  Bipap not indicated at this time.

## 2023-11-25 NOTE — TOC Progression Note (Signed)
 Transition of Care [facility]) - Progression Note    Patient Details  Name: Justin [name] MRN: [id number] Date of Birth: 05/22/1934  Transition of Care [facility]) CM/SW Contact  Larrie Kass, LCSW Phone Number: 11/25/2023, 10:04 AM  Clinical Narrative:    Pt's insurance [employer] is pending. TOC to follow.        Expected Discharge Plan and Services In-house Referral: NA     Living arrangements for the past 2 months: Independent Living Facility                 DME Arranged: N/A DME Agency: NA       HH Arranged: NA HH Agency: NA         Social Determinants of Health (SDOH) Interventions SDOH Screenings   Food Insecurity: No Food Insecurity (11/18/2023)  Housing: Low Risk  (11/18/2023)  Transportation Needs: No Transportation Needs (11/18/2023)  Utilities: Not At Risk (11/18/2023)  Alcohol Screen: Low Risk  (03/31/2023)  Depression (PHQ2-9): High Risk (11/13/2023)  Financial Resource Strain: Low Risk  (03/31/2023)  Physical Activity: Sufficiently Active (03/31/2023)  Social Connections: Socially Integrated (11/18/2023)  Stress: No Stress Concern Present (03/31/2023)  Tobacco Use: Medium Risk (11/17/2023)  Health Literacy: Adequate Health Literacy (03/31/2023)    Readmission Risk Interventions    11/19/2023    1:00 PM  Readmission Risk Prevention Plan  Transportation Screening Complete  PCP or Specialist Appt within 3-5 Days Complete  HRI or Home Care Consult Complete  Social Work Consult for Recovery Care Planning/Counseling Complete  Palliative Care Screening Not Applicable  Medication Review Oceanographer) Complete

## 2023-11-25 NOTE — Progress Notes (Signed)
 Physical Therapy Treatment Patient Details Name: Justin [name] MRN: [id number] DOB: Apr 11, 1934 Today's Date: 11/25/2023   History of Present Illness Pt admitted from IND living at [facility] 2* acute resepiratory failure, L pleural effusion (s/p thoracentesis 11/19/23) and non-covid coronavirus.  Pt with hx of macuar degneration with very limited vision, CAD, CABG, MI, TAVR, PAF, aortic stenosis, prostate CA, DM, MDD and pacemaker placement    PT Comments  Pt agreeable to working with therapy-very motivated. Remained on  O2 during session-sats 93% with activity. Mobility remains limited by fatigue, weakness, decreased activity tolerance. Will continue to follow and progress activity as tolerated. Patient will benefit from continued inpatient follow up therapy, <3 hours/day     If plan is discharge home, recommend the following: A little help with walking and/or transfers;A little help with bathing/dressing/bathroom;Assistance with cooking/housework;Assist for transportation;Help with stairs or ramp for entrance   Can travel by private vehicle     No  Equipment Recommendations  None recommended by PT    Recommendations for Other Services       Precautions / Restrictions Precautions Precautions: Fall Precaution/Restrictions Comments: monitor O2 Restrictions Weight Bearing Restrictions Per Provider Order: No     Mobility  Bed Mobility               General bed mobility comments: oob in recliner    Transfers Overall transfer level: Needs assistance Equipment used: Rolling walker (2 wheels) Transfers: Sit to/from Stand Sit to Stand: Min assist           General transfer comment: Cues for use of UEs to self assist. Increased time.    Ambulation/Gait Ambulation/Gait assistance: Min assist, +2 safety/equipment Gait Distance (Feet): 35 Feet Assistive device: Rolling walker (2 wheels) Gait Pattern/deviations: Step-through pattern, Decreased stride length        General Gait Details: Cues for safety, RW proximity/positioning. Slow gait speed. Ambulation distance limited by fatigue. O2 93% on 3L. Pt also reports R LE feel weak.   Stairs             Wheelchair Mobility     Tilt Bed    Modified Rankin (Stroke Patients Only)       Balance Overall balance assessment: Needs assistance         Standing balance support: Bilateral upper extremity supported, During functional activity, Reliant on assistive device for balance Standing balance-Leahy Scale: Poor                              Communication Communication Communication: No apparent difficulties  Cognition Arousal: Alert Behavior During Therapy: WFL for tasks assessed/performed                             Following commands: Intact      Cueing Cueing Techniques: Verbal cues  Exercises      General Comments        Pertinent Vitals/Pain Pain Assessment Pain Assessment: No/denies pain    Home Living                          Prior Function            PT Goals (current goals can now be found in the care plan section) Progress towards PT goals: Progressing toward goals    Frequency    Min 2X/week  PT Plan      Co-evaluation              AM-PAC PT "6 Clicks" Mobility   Outcome Measure  Help needed turning from your back to your side while in a flat bed without using bedrails?: A Little Help needed moving from lying on your back to sitting on the side of a flat bed without using bedrails?: A Little Help needed moving to and from a bed to a chair (including a wheelchair)?: A Little Help needed standing up from a chair using your arms (e.g., wheelchair or bedside chair)?: A Little Help needed to walk in hospital room?: A Lot Help needed climbing 3-5 steps with a railing? : Total 6 Click Score: 15    End of Session Equipment Utilized During Treatment: Gait belt;Oxygen Activity Tolerance: Patient  tolerated treatment well;Patient limited by fatigue Patient left: in chair;with call bell/phone within reach;with chair alarm set   PT Visit Diagnosis: Unsteadiness on feet (R26.81);Muscle weakness (generalized) (M62.81);Difficulty in walking, not elsewhere classified (R26.2)     Time: 7846-9629 PT Time Calculation (min) (ACUTE ONLY): 15 min  Charges:    $Gait Training: 8-22 mins PT General Charges $$ ACUTE PT VISIT: 1 Visit                        [name] Ramsay, PT Acute Rehabilitation  Office: [phone]

## 2023-11-26 DIAGNOSIS — J9601 Acute respiratory failure with hypoxia: Secondary | ICD-10-CM | POA: Diagnosis not present

## 2023-11-26 LAB — CBC WITH DIFFERENTIAL/PLATELET
Abs Immature Granulocytes: 0.28 10*3/uL — ABNORMAL HIGH (ref 0.00–0.07)
Basophils Absolute: 0 10*3/uL (ref 0.0–0.1)
Basophils Relative: 0 %
Eosinophils Absolute: 0.2 10*3/uL (ref 0.0–0.5)
Eosinophils Relative: 1 %
HCT: 43 % (ref 39.0–52.0)
Hemoglobin: 13.4 g/dL (ref 13.0–17.0)
Immature Granulocytes: 2 %
Lymphocytes Relative: 5 %
Lymphs Abs: 1 10*3/uL (ref 0.7–4.0)
MCH: 26.9 pg (ref 26.0–34.0)
MCHC: 31.2 g/dL (ref 30.0–36.0)
MCV: 86.2 fL (ref 80.0–100.0)
Monocytes Absolute: 1.2 10*3/uL — ABNORMAL HIGH (ref 0.1–1.0)
Monocytes Relative: 6 %
Neutro Abs: 16.4 10*3/uL — ABNORMAL HIGH (ref 1.7–7.7)
Neutrophils Relative %: 86 %
Platelets: 345 10*3/uL (ref 150–400)
RBC: 4.99 MIL/uL (ref 4.22–5.81)
RDW: 17.8 % — ABNORMAL HIGH (ref 11.5–15.5)
WBC: 19.1 10*3/uL — ABNORMAL HIGH (ref 4.0–10.5)
nRBC: 0 % (ref 0.0–0.2)

## 2023-11-26 LAB — BASIC METABOLIC PANEL WITH GFR
Anion gap: 10 (ref 5–15)
BUN: 43 mg/dL — ABNORMAL HIGH (ref 8–23)
CO2: 27 mmol/L (ref 22–32)
Calcium: 7.8 mg/dL — ABNORMAL LOW (ref 8.9–10.3)
Chloride: 97 mmol/L — ABNORMAL LOW (ref 98–111)
Creatinine, Ser: 1.11 mg/dL (ref 0.61–1.24)
GFR, Estimated: >60 mL/min (ref >60)
Glucose, Bld: 94 mg/dL (ref 70–99)
Potassium: 3.8 mmol/L (ref 3.5–5.1)
Sodium: 134 mmol/L — ABNORMAL LOW (ref 135–145)

## 2023-11-26 LAB — MAGNESIUM: Magnesium: 2.6 mg/dL — ABNORMAL HIGH (ref 1.7–2.4)

## 2023-11-26 LAB — GLUCOSE, CAPILLARY
Glucose-Capillary: 77 mg/dL (ref 70–99)
Glucose-Capillary: 96 mg/dL (ref 70–99)
Glucose-Capillary: 185 mg/dL — ABNORMAL HIGH (ref 70–99)
Glucose-Capillary: 105 mg/dL — ABNORMAL HIGH (ref 70–99)

## 2023-11-26 NOTE — Plan of Care (Signed)

## 2023-11-26 NOTE — Progress Notes (Signed)
 Mobility Specialist - Progress Note   11/26/23 1351  Mobility  Activity Ambulated with assistance in hallway  Level of Assistance Standby assist, set-up cues, supervision of patient - no hands on  Assistive Device Front wheel walker  Distance Ambulated (ft) 80 ft  Activity Response Tolerated well  Mobility Referral Yes  Mobility visit 1 Mobility  Mobility Specialist Start Time (ACUTE ONLY) 1325  Mobility Specialist Stop Time (ACUTE ONLY) 1349  Mobility Specialist Time Calculation (min) (ACUTE ONLY) 24 min   Pt received in bed and agreeable to mobility. Pt took x3 standing rest breaks during session. Pt wheeled back into room in recliner d/t fatigue. Unable to record SpO2 reading d/t malfunctioning pulse ox. No complaints during session. Pt to recliner after session with all needs met. Chair alarm on.   Pre-mobility: 97% SpO2 (3L [place])  Chief Technology Officer

## 2023-11-26 NOTE — Progress Notes (Signed)
 PROGRESS NOTE BERNHARDT RIEMENSCHNEIDER  VOZ:[id number] DOB: [birth year] DOA: 11/17/2023 PCP: Mast, Man X, NP  Brief Narrative/Hospital Course: 89 yo male with PMH BPH, CAD s/p CABG, CHF, HTN, HLD, DMII, AS s/p TAVR, PAF, hypothyroidism who presented with shortness of breath who was recently seen by PCP and also given abx due to symptoms along with prednisone. Patient seen in the ED chest x-ray showed interstitial markings concerning for pneumonia/pulmonary edema troponin 14 BNP 880, due to increased work of breathing needed BiPAP and subsequently admitted for acute hypoxic aspiratory failure from coronavirus non-COVID infection along with background history of systolic CHF, pleural effusion,  Consultation: Cardiology Pulmonary  Significant testing/procedure: CT chest without 3/26>Extensive airspace disease throughout the right lung compatible with pneumonia. Moderate sized left pleural effusion with compressive atelectasis in the left lower lobe.  Subjective: Seen and examined this morning Resting comfortably Asking if he can go to SNF without oxygen Overnight on 4 L [place] BP 90s-110.  Labs reviewed stable BMP, CBC with persistent leukocytosis ~ 19k  Assessment and plan:  Acute hypoxic respiratory failure Coronavirus infection with  OC43 with pneumonia Hypoxic respiratory failure multifactorial due to coronavirus infection, pneumonia, pleural effusion, acute HFmrEF.  Managed with multimodal approach with diuretics, seen by cardiology GDMT adjusted.  Also seen by pulmonary, given steroids bronchodilators and antibiotics.  Initially on high-dose steroids x 5 days till 3/29> then prednisone taper  40 mg daily x 5 days, 20 mg daily x 5 days, 10 mg for 5 days then stop.    Left pleural effusion: Thoracentesis/26 with 1000 cc of bloody fluid removed exudative by LDH.  Complete antibiotics.  On a steroid taper  Acute HFmREF: Cardiology managing, diuresis with IV Lasix initially and every 6 hours stop 3/30  good negative net balance as below and weight further down.  Continue torsemide 20 from/1, GDMT with Toprol-XL, losartan.  CAD with history of CABG times 11/2005 HTN HLD: No acute issues, PTA on aspirin, Lipitor continue the same.  BP fairly stable, on losartan, Toprol.  History of aortic stenosis status post TAVR 2018: Echo on 3/25 EF 40-45%.  Severe LAE RA, moderately reduced RV, abnormal wall motion in addition to RV pacing.  Normal function of aortic valve prosthesis.  Permanent A-fib LBBB/IVCD Frequent PVCs/NSVT: Rate controlled.on amiodarone 200 daily for respiratory suppression, on Pradaxa for anticoagulation.  Continue Toprol.  Followed by EP cardiology outpatient..  Hypothyroidism: TSH 0.1 69 and  FT4 elevated 1.6-Synthroid dose decreased to 175 mcg needs repeat TSH and FT4 in 4 to 6 weeks to review adjust dose  Asymptomatic bacteriuria: ESBL E. coli but UA with WBC 0-5 and asymptomatic.  No indication for treatment  CKD stage IIIa: Baseline 1.3-1.4, stable, monitor  GERD: Continue PPI  Depression: Continue her home Effexor recently started  Type 2 diabetes mellitus:  A1c stable On sliding scale insulin Recent Labs  Lab 11/25/23 0816 11/25/23 1130 11/25/23 1640 11/25/23 2053 11/26/23 0725  GLUCAP 81 96 114* 216* 77    class  II  Obesity w/ Body mass index is 38.56 kg/m.: Will benefit with PCP follow-up, weight loss,healthy lifestyle and outpatient sleep eval if not done.   DVT prophylaxis: SCDs Start: 11/17/23 1536 Code Status:   Code Status: Limited: Do not attempt resuscitation (DNR) -DNR-LIMITED -Do Not Intubate/DNI  Family Communication: plan of care discussed with patient at bedside. Patient status is: Remains hospitalized because of severity of illness Level of care: Progressive   Dispo: The patient is from: facility  Anticipated disposition: SNF in 1-2 days if respiratory status better   Objective: Vitals last 24 hrs: Vitals:    11/25/23 2039 11/26/23 0509 11/26/23 0819 11/26/23 1213  BP: (!) 92/50 (!) 110/58  101/82  Pulse: 65 79  81  Resp: 16 19  16   Temp: 97.8 F (36.6 C) 97.8 F (36.6 C)  98 F (36.7 C)  TempSrc: Oral     SpO2: 98% 97% (!) 86% 96%  Weight:      Height:       Weight change:   Physical Examination: General exam: alert awake, older than stated age HEENT:Oral mucosa moist, Ear/Nose WNL grossly Respiratory system: Bilaterally diminished BS, no use of accessory muscle Cardiovascular system: S1 & S2 +. Gastrointestinal system: Abdomen soft, NT,ND,BS+ Nervous System: Alert, awake,following commands. Extremities: LE edema +, moving arms, warm legs Skin: No rashes,warm. MSK: Normal muscle bulk/tone.   Medications reviewed:  Scheduled Meds:  amiodarone  200 mg Oral Daily   arformoterol  15 mcg Nebulization BID   aspirin EC  81 mg Oral Daily   atorvastatin  20 mg Oral Daily   budesonide (PULMICORT) nebulizer solution  0.25 mg Nebulization BID   Chlorhexidine Gluconate Cloth  6 each Topical Q2200   dabigatran  150 mg Oral BID   insulin aspart  0-15 Units Subcutaneous TID WC   insulin aspart  0-5 Units Subcutaneous QHS   levalbuterol  0.63 mg Nebulization TID   levothyroxine  175 mcg Oral QAC breakfast   losartan  12.5 mg Oral Daily   magic mouthwash  5 mL Oral QID   metoprolol succinate  50 mg Oral Daily   pantoprazole  40 mg Oral Daily   predniSONE  40 mg Oral Q breakfast   torsemide  20 mg Oral Daily   venlafaxine XR  37.5 mg Oral Q breakfast  Continuous Infusions:   Diet Order             Diet 2 gram sodium Room service appropriate? Yes; Fluid consistency: Thin  Diet effective now                  Intake/Output Summary (Last 24 hours) at 11/26/2023 1253 Last data filed at 11/26/2023 0856 Gross per 24 hour  Intake 120 ml  Output 1350 ml  Net -1230 ml   Net IO Since Admission: -10,961.81 mL [11/26/23 1253]  Wt Readings from Last 3 Encounters:  11/21/23 110 kg   11/13/23 113.6 kg  11/12/23 112.9 kg     Unresulted Labs (From admission, onward)     Start     Ordered   11/24/23 0500  Basic metabolic panel with GFR  Daily,   R     Question:  Specimen collection method  Answer:  Lab=Lab collect   11/23/23 1350   11/24/23 0500  CBC with Differential/Platelet  Daily,   R     Question:  Specimen collection method  Answer:  Lab=Lab collect   11/23/23 1350   11/24/23 0500  Magnesium  Daily,   R     Question:  Specimen collection method  Answer:  Lab=Lab collect   11/23/23 1350          Data Reviewed: I have personally reviewed following labs and imaging studies ( see epic result tab) CBC: Recent Labs  Lab 11/22/23 0231 11/23/23 0244 11/24/23 0242 11/25/23 0422 11/26/23 0417  WBC 18.0* 17.0* 21.7* 19.5* 19.1*  NEUTROABS 15.6* 15.2* 18.6* 16.4* 16.4*  HGB 13.2 13.8 13.7 13.4  13.4  HCT 42.4 44.8 42.4 41.5 43.0  MCV 86.7 87.2 84.3 86.6 86.2  PLT 352 364 355 365 345   CMP: Recent Labs  Lab 11/22/23 0231 11/22/23 1400 11/23/23 0244 11/24/23 0242 11/25/23 0728 11/26/23 0417  NA 136 135 135 135 134* 134*  K 3.7 3.8 3.9 3.5 4.0 3.8  CL 99 97* 96* 97* 98 97*  CO2 25 28 29 27 27 27   GLUCOSE 137* 205* 128* 124* 87 94  BUN 52* 49* 51* 52* 41* 43*  CREATININE 1.11 1.16 1.32* 1.24 1.13 1.11  CALCIUM 8.0* 7.8* 8.0* 7.9* 7.8* 7.8*  MG 2.8*  --  2.7* 2.7* 2.7* 2.6*   GFR: Estimated Creatinine Clearance: 55 mL/min (by C-G formula based on SCr of 1.11 mg/dL). Recent Labs  Lab 11/19/23 1844 11/20/23 1317  AST  --  93*  ALT  --  70*  ALKPHOS  --  151*  BILITOT  --  0.8  PROT 7.3 6.5  ALBUMIN  --  2.1*  Sepsis Labs: Recent Labs  Lab 11/22/23 0231 11/22/23 0311  PROCALCITON <0.10  --   LATICACIDVEN  --  2.1*   Recent Results (from the past 240 hours)  Respiratory (~20 pathogens) panel by PCR     Status: Abnormal   Collection Time: 11/18/23  2:22 AM   Specimen: Nasopharyngeal Swab; Respiratory  Result Value Ref Range Status    Adenovirus NOT DETECTED NOT DETECTED Final   Coronavirus 229E NOT DETECTED NOT DETECTED Final    Comment: (NOTE) The Coronavirus on the Respiratory Panel, DOES NOT test for the novel  Coronavirus (2019 nCoV)    Coronavirus HKU1 NOT DETECTED NOT DETECTED Final   Coronavirus NL63 NOT DETECTED NOT DETECTED Final   Coronavirus OC43 DETECTED (A) NOT DETECTED Final   Metapneumovirus NOT DETECTED NOT DETECTED Final   Rhinovirus / Enterovirus NOT DETECTED NOT DETECTED Final   Influenza A NOT DETECTED NOT DETECTED Final   Influenza B NOT DETECTED NOT DETECTED Final   Parainfluenza Virus 1 NOT DETECTED NOT DETECTED Final   Parainfluenza Virus 2 NOT DETECTED NOT DETECTED Final   Parainfluenza Virus 3 NOT DETECTED NOT DETECTED Final   Parainfluenza Virus 4 NOT DETECTED NOT DETECTED Final   Respiratory Syncytial Virus NOT DETECTED NOT DETECTED Final   Bordetella pertussis NOT DETECTED NOT DETECTED Final   Bordetella Parapertussis NOT DETECTED NOT DETECTED Final   Chlamydophila pneumoniae NOT DETECTED NOT DETECTED Final   Mycoplasma pneumoniae NOT DETECTED NOT DETECTED Final    Comment: Performed at [facility], [address], [place], [place] [address]  Resp panel by RT-PCR (RSV, Flu A&B, Covid) Anterior Nasal Swab     Status: None   Collection Time: 11/18/23  2:22 AM   Specimen: Anterior Nasal Swab  Result Value Ref Range Status   SARS Coronavirus 2 by RT PCR NEGATIVE NEGATIVE Final    Comment: (NOTE) SARS-CoV-2 target nucleic acids are NOT DETECTED.  The SARS-CoV-2 RNA is generally detectable in upper respiratory specimens during the acute phase of infection. The lowest concentration of SARS-CoV-2 viral copies this assay can detect is 138 copies/mL. A negative result does not preclude SARS-Cov-2 infection and should not be used as the sole basis for treatment or other patient management decisions. A negative result may occur with  improper specimen collection/handling, submission of  specimen other than nasopharyngeal swab, presence of viral mutation(s) within the areas targeted by this assay, and inadequate number of viral copies(<138 copies/mL). A negative result  must be combined with clinical observations, patient history, and epidemiological information. The expected result is Negative.  Fact Sheet for Patients:  BloggerCourse.com  Fact Sheet for Healthcare Providers:  SeriousBroker.it  This test is no t yet approved or cleared by the [place] FDA and  has been authorized for detection and/or diagnosis of SARS-CoV-2 by FDA under an Emergency Use Authorization (EUA). This EUA will remain  in effect (meaning this test can be used) for the duration of the COVID-19 declaration under Section 564(b)(1) of the Act, 21 U.S.C.section 360bbb-3(b)(1), unless the authorization is terminated  or revoked sooner.       Influenza A by PCR NEGATIVE NEGATIVE Final   Influenza B by PCR NEGATIVE NEGATIVE Final    Comment: (NOTE) The Xpert Xpress SARS-CoV-2/FLU/RSV plus assay is intended as an aid in the diagnosis of influenza from Nasopharyngeal swab specimens and should not be used as a sole basis for treatment. Nasal washings and aspirates are unacceptable for Xpert Xpress SARS-CoV-2/FLU/RSV testing.  Fact Sheet for Patients: BloggerCourse.com  Fact Sheet for Healthcare Providers: SeriousBroker.it  This test is not yet approved or cleared by the [place] FDA and has been authorized for detection and/or diagnosis of SARS-CoV-2 by FDA under an Emergency Use Authorization (EUA). This EUA will remain in effect (meaning this test can be used) for the duration of the COVID-19 declaration under Section 564(b)(1) of the Act, 21 U.S.C. section 360bbb-3(b)(1), unless the authorization is terminated or revoked.     Resp Syncytial Virus by PCR NEGATIVE NEGATIVE Final     Comment: (NOTE) Fact Sheet for Patients: BloggerCourse.com  Fact Sheet for Healthcare Providers: SeriousBroker.it  This test is not yet approved or cleared by the [place] FDA and has been authorized for detection and/or diagnosis of SARS-CoV-2 by FDA under an Emergency Use Authorization (EUA). This EUA will remain in effect (meaning this test can be used) for the duration of the COVID-19 declaration under Section 564(b)(1) of the Act, 21 U.S.C. section 360bbb-3(b)(1), unless the authorization is terminated or revoked.  Performed at [facility], [address] [place], [place] [address]   Culture, blood (Routine X 2) w Reflex to ID Panel     Status: None   Collection Time: 11/18/23 10:12 AM   Specimen: BLOOD  Result Value Ref Range Status   Specimen Description   Final    BLOOD BLOOD RIGHT ARM AEROBIC BOTTLE ONLY ANAEROBIC BOTTLE ONLY Performed at [facility], [address] [place], [place] [address]    Special Requests   Final    BOTTLES DRAWN AEROBIC AND ANAEROBIC Blood Culture results may not be optimal due to an inadequate volume of blood received in culture bottles Performed at [facility], [address], [place], [place] [address]    Culture   Final    NO GROWTH 5 DAYS Performed at [facility], [address] [place], [place] [address]    Report Status 11/23/2023 FINAL  Final  Culture, blood (Routine X 2) w Reflex to ID Panel     Status: None   Collection Time: 11/18/23 10:12 AM   Specimen: BLOOD  Result Value Ref Range Status   Specimen Description   Final    BLOOD BLOOD RIGHT HAND AEROBIC BOTTLE ONLY ANAEROBIC BOTTLE ONLY Performed at [facility], [address], [place], [place] [address]    Special Requests   Final    BOTTLES DRAWN AEROBIC AND ANAEROBIC Blood  Culture results may not be optimal due to an inadequate  volume of blood received in culture bottles Performed at [facility], [address], [place], [place] [address]    Culture   Final    NO GROWTH 5 DAYS Performed at [facility], [address] [place], [place] [address]    Report Status 11/23/2023 FINAL  Final  Expectorated Sputum Assessment w Gram Stain, Rflx to Resp Cult     Status: None   Collection Time: 11/18/23 12:30 PM   Specimen: Sputum  Result Value Ref Range Status   Specimen Description SPUTUM  Final   Special Requests Normal  Final   Sputum evaluation   Final    THIS SPECIMEN IS ACCEPTABLE FOR SPUTUM CULTURE Performed at [facility], [address] [place], [place] [address]    Report Status 11/18/2023 FINAL  Final  Culture, Respiratory w Gram Stain     Status: None   Collection Time: 11/18/23 12:30 PM   Specimen: SPU  Result Value Ref Range Status   Specimen Description   Final    SPUTUM Performed at [facility], [address], [place], [place] [address]    Special Requests   Final    Normal Reflexed from [id number] Performed at [facility], [address], [place], [place] [address]    Gram Stain NO WBC SEEN NO ORGANISMS SEEN   Final   Culture   Final    Normal respiratory flora-no Staph aureus or Pseudomonas seen Performed at [facility], [address], [place], [place] [address]    Report Status 11/20/2023 FINAL  Final  Urine Culture (for pregnant, neutropenic or urologic patients or patients with an indwelling urinary catheter)     Status: Abnormal   Collection Time: 11/18/23 12:49 PM   Specimen: Urine, Clean Catch  Result Value Ref Range Status   Specimen Description   Final    URINE, CLEAN CATCH Performed at [facility], [address], [place], [place] [address]    Special Requests   Final    NONE Performed at [facility], [address] [place], [place] [address]     Culture (A)  Final    >=100,000 COLONIES/mL ESCHERICHIA COLI Confirmed Extended Spectrum Beta-Lactamase Producer (ESBL).  In bloodstream infections from ESBL organisms, carbapenems are preferred over piperacillin/tazobactam. They are shown to have a lower risk of mortality.    Report Status 11/20/2023 FINAL  Final   Organism ID, Bacteria ESCHERICHIA COLI (A)  Final      Susceptibility   Escherichia coli - MIC*    AMPICILLIN >=32 RESISTANT Resistant     CEFAZOLIN >=64 RESISTANT Resistant     CEFEPIME 16 RESISTANT Resistant     CEFTRIAXONE >=64 RESISTANT Resistant     CIPROFLOXACIN >=4 RESISTANT Resistant     GENTAMICIN >=16 RESISTANT Resistant     IMIPENEM <=0.25 SENSITIVE Sensitive     NITROFURANTOIN <=16 SENSITIVE Sensitive     TRIMETH/SULFA >=320 RESISTANT Resistant     AMPICILLIN/SULBACTAM >=32 RESISTANT Resistant     PIP/TAZO 64 INTERMEDIATE Intermediate ug/mL    * >=100,000 COLONIES/mL ESCHERICHIA COLI  Body fluid culture w Gram Stain     Status: None   Collection Time: 11/19/23  5:47 PM   Specimen: Pleural Fluid  Result Value Ref Range Status   Specimen Description   Final    PLEURAL LEFT LUNG Performed at [facility], [address] [place], [place] [address]    Special Requests   Final    NONE Performed at [facility], [address] [place], [place] [address]    Gram Stain   Final    RARE WBC PRESENT, PREDOMINANTLY MONONUCLEAR NO ORGANISMS SEEN    Culture   Final    NO GROWTH 3 DAYS Performed at [facility], [address] [place], [place] [address]    Report Status 11/23/2023 FINAL  Final  MRSA Next Gen by PCR, Nasal     Status: None   Collection Time: 11/20/23 11:10 PM   Specimen: Nasal Mucosa; Nasal Swab  Result Value Ref Range Status   MRSA by PCR Next Gen NOT DETECTED NOT DETECTED Final    Comment: (NOTE) The GeneXpert MRSA Assay (FDA approved for NASAL specimens only), is one component of a  comprehensive MRSA colonization surveillance program. It is not intended to diagnose MRSA infection nor to guide or monitor treatment for MRSA infections. Test performance is not FDA approved in patients less than 35 years old. Performed at [facility], [address], [place], [place] [address]   Antimicrobials/Microbiology: Anti-infectives (From admission, onward)    Start     Dose/Rate Route Frequency Ordered Stop   11/18/23 2200  cefTRIAXone (ROCEPHIN) 2 g in sodium chloride 0.9 % 100 mL IVPB  Status:  Discontinued        2 g 200 mL/hr over 30 Minutes Intravenous Every 24 hours 11/18/23 1107 11/23/23 1205   11/18/23 0200  doxycycline (VIBRA-TABS) tablet 100 mg        100 mg Oral Every 12 hours 11/18/23 0119 11/22/23 2100   11/18/23 0200  cefTRIAXone (ROCEPHIN) 1 g in sodium chloride 0.9 % 100 mL IVPB  Status:  Discontinued        1 g 200 mL/hr over 30 Minutes Intravenous Every 24 hours 11/18/23 0119 11/18/23 1107   11/17/23 1545  azithromycin (ZITHROMAX) tablet 500 mg  Status:  Discontinued        500 mg Oral Daily 11/17/23 1537 11/17/23 1609         Component Value Date/Time   SDES  11/19/2023 1747    PLEURAL LEFT LUNG Performed at [facility], [address] [place], [place] [address]    SPECREQUEST  11/19/2023 1747    NONE Performed at [facility], [address] [place], [place] [address]    CULT  11/19/2023 1747    NO GROWTH 3 DAYS Performed at [facility], [address], [place], [place] [address]    REPTSTATUS 11/23/2023 FINAL 11/19/2023 1747     Radiology Studies: DG Swallowing Func-Speech Pathology Result Date: 11/24/2023 Table formatting from the original result was not included. Modified Barium Swallow Study Patient Details Name: MACOY RODWELL MRN: [id number] Date of Birth: May 16, 1934 Today's Date: 11/24/2023 HPI/PMH: HPI: 89 yo presenting 3/24 with shortness of breath. CXR showed  asymmetric interstitial opacities concerning for atypical pneumonia versus pulmonary edema or both. RVP swab positive for coronavirus OC 43.  He also became more hypoxic requiring salter high flow. PMH includes: GERD, BPH, CAD s/p CABG, CHF, HTN, HLD, DMII, AS s/p TAVR, PAF, hypothyroidism Clinical Impression: Clinical Impression: Patient presents with an oropharyngeal and an esophageal dysphagia as per this MBS. Mastication and oral transit of solid and puree boluses was [facility]. Swallow was initiated at level of vallecular sinus with puree and mechanical soft solids  and honey thick liquids, initiated at level of posterior laryngeal surface of the epiglottis with nectar thick liquids and delayed at level of pyriform sinus with thin liquids. Anterior hyoid excursion, laryngeal elevation both partial in completion. With small, single sips of thin liquids, no aspiration observed but with consecutive sips, patient with sensed aspiration (PAS 7) with thin liquids secondary to inadequate and mistimed closure of laryngeal vestibule. Chin tuck posture was not beneficial to reduce aspiration risk or amount. Esophageal sweep revealed retrograde movement and stasis of barium in distal esophagus. SLP educated patient on results of this MBS, recommendation for small, single cup sips of thin liquids and not straws, no consecutive cup sips of thin liquids. SLP also recommending management of patient's GERD and for him to follow general GERD/Reflux precautions such as not laying down flat after eating, sitting upright and remaining upright for 45 minutes after meals. Patient is at risk for post-prandial aspiration. SLP will continue to follow. Factors that may increase risk of adverse event in presence of aspiration Rubye Oaks & Clearance Coots 2021): Factors that may increase risk of adverse event in presence of aspiration Rubye Oaks & Clearance Coots 2021): Respiratory or GI disease; Poor general health and/or compromised immunity; Frail or deconditioned  Recommendations/Plan: Swallowing Evaluation Recommendations Swallowing Evaluation Recommendations Recommendations: PO diet PO Diet Recommendation: Regular; Thin liquids (Level 0) Liquid Administration via: No straw; Cup Medication Administration: Whole meds with puree Supervision: Patient able to self-feed Swallowing strategies  : Slow rate; Small bites/sips Postural changes: Position pt fully upright for meals; Stay upright 30-60 min after meals Oral care recommendations: Oral care BID (2x/day) Treatment Plan Treatment Plan Treatment recommendations: Therapy as outlined in treatment plan below Follow-up recommendations: Skilled nursing-short term rehab (<3 hours/day) Functional status assessment: Patient has had a recent decline in their functional status and demonstrates the ability to make significant improvements in function in a reasonable and predictable amount of time. Treatment frequency: Min 2x/week Treatment duration: 1 week Interventions: Aspiration precaution training; Diet toleration management by SLP; Patient/family education Recommendations Recommendations for follow up therapy are one component of a multi-disciplinary discharge planning process, led by the attending physician.  Recommendations may be updated based on patient status, additional functional criteria and insurance authorization. Assessment: Orofacial Exam: No data recorded Anatomy: Anatomy: Prominent cricopharyngeus Boluses Administered: Boluses Administered Boluses Administered: Thin liquids (Level 0); Mildly thick liquids (Level 2, nectar thick); Moderately thick liquids (Level 3, honey thick); Puree; Solid  Oral Impairment Domain: Oral Impairment Domain Lip Closure: No labial escape Tongue control during bolus hold: Not tested Bolus preparation/mastication: Timely and efficient chewing and mashing Bolus transport/lingual motion: Brisk tongue motion Oral residue: Complete oral clearance Location of oral residue : N/A Initiation of  pharyngeal swallow : Valleculae; Pyriform sinuses  Pharyngeal Impairment Domain: Pharyngeal Impairment Domain Soft palate elevation: No bolus between soft palate (SP)/pharyngeal wall (PW) Laryngeal elevation: Partial superior movement of thyroid cartilage/partial approximation of arytenoids to epiglottic petiole Anterior hyoid excursion: Partial anterior movement Epiglottic movement: Complete inversion Laryngeal vestibule closure: Incomplete, narrow column air/contrast in laryngeal vestibule Pharyngeal stripping wave : Present - diminished Pharyngeal contraction (A/P view only): N/A Pharyngoesophageal segment opening: Complete distension and complete duration, no obstruction of flow Tongue base retraction: No contrast between tongue base and posterior pharyngeal wall (PPW) Pharyngeal residue: Collection of residue within or on pharyngeal structures Location of pharyngeal residue: Valleculae; Pyriform sinuses; Pharyngeal wall  Esophageal Impairment Domain: Esophageal Impairment Domain Esophageal clearance upright position: Complete clearance, esophageal coating Pill: Pill Consistency administered: Puree Puree:  [facility] Penetration/Aspiration Scale Score: Penetration/Aspiration Scale Score 1.  Material does not enter airway: Mildly thick liquids (Level 2, nectar thick); Moderately thick liquids (Level 3, honey thick); Puree; Solid 5.  Material enters airway, CONTACTS cords and not ejected out: Thin liquids (Level 0) 7.  Material enters airway, passes BELOW cords and not ejected out despite cough attempt by patient: Thin liquids (Level 0) Compensatory Strategies: Compensatory Strategies Compensatory strategies: Yes Chin tuck: Ineffective Ineffective Chin Tuck: Thin liquid (Level 0)   General Information: No data recorded Diet Prior to this Study: Regular; Thin liquids (Level 0)   Temperature : Normal   No data recorded  Supplemental O2: Nasal cannula   History of Recent Intubation: No  Behavior/Cognition: Alert;  Cooperative; Pleasant mood Self-Feeding Abilities: Able to self-feed Baseline vocal quality/speech: Normal Volitional Cough: Able to elicit Volitional Swallow: Able to elicit Exam Limitations: No limitations Goal Planning: Prognosis for improved oropharyngeal function: Good No data recorded No data recorded Patient/Family Stated Goal: plan is return to SNF Consulted and agree with results and recommendations: Patient Pain: Pain Assessment Pain Assessment: No/denies pain End of Session: Start Time:SLP Start Time (ACUTE ONLY): 1445 Stop Time: SLP Stop Time (ACUTE ONLY): 1505 Time Calculation:SLP Time Calculation (min) (ACUTE ONLY): 20 min Charges: SLP Evaluations $ SLP Speech Visit: 1 Visit SLP Evaluations $MBS Swallow: 1 Procedure SLP visit diagnosis: SLP Visit Diagnosis: Dysphagia, oropharyngeal phase (R13.12); Dysphagia, pharyngoesophageal phase (R13.14) Past Medical History: Past Medical History: Diagnosis Date  Age-related macular degeneration, dry, both eyes   Allergic   "24/7; 365 days/year; I'm allergic to pollens, dust, all [place] grasses/trees, mold, mildue, cats, dogs" (01/07/2017)  Anal fissure   Asthma   sees Dr. Kendrick Fries   Benign prostatic hypertrophy   (sees Dr. Chester Holstein  CAD (coronary artery disease)   a. s/p CABG 2007. b. Cath 08/2016 - 4/4 patent grafts.  Carotid bruit   carotid u/s 10/10: 0.39% bilaterally  Chronic combined systolic and diastolic CHF (congestive heart failure) (HCC)   Complication of anesthesia 1980s  "w/anal cyst OR, he gave me a saddle block then put a narcotic in spinal cord; had a severe reaction to that" (01/07/2017)  Congestive heart failure (CHF) [facility])   ED (erectile dysfunction)   Family history of adverse reaction to anesthesia   "daughter wakes up during OR" (01/07/2017)  GERD (gastroesophageal reflux disease)   Gout   HTN (hypertension)   Hx of colonic polyps   (sees Dr. Marina Goodell)  Hyperlipidemia   Hypothyroidism   Moderate to severe aortic stenosis   a. s/p TAVR 02/2017.   Myocardial infarction [facility]) ~ 2000  Obesity   Osteoarthritis   "was in my knees, hands" (01/07/2017 )  PAF (paroxysmal atrial fibrillation) (HCC)   a. documented post TAVR.  Precancerous skin lesion   (sees Dr. 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  29 mm Edwards Sapien 3 transcatheter heart valve placed via percutaneous right transfemoral approach Past Surgical History: Past Surgical History: Procedure Laterality Date  ANUS SURGERY    "opened it back up cause it wouldn't heal; wound up w/a fissure" (01/07/2017)  BIV ICD INSERTION CRT-D N/A 05/02/2017  Procedure: BIV ICD INSERTION CRT-D;  Surgeon: Duke Salvia, MD;  Location: [facility] INVASIVE CV LAB;  Service: Cardiovascular;  Laterality: N/A;  CARDIAC CATHETERIZATION  10/29/2005  CARDIAC CATHETERIZATION N/A 08/27/2016  Procedure: Right/Left Heart Cath and Coronary/Graft Angiography;  Surgeon: Kathleene Hazel,  MD;  Location: [facility] INVASIVE CV LAB;  Service: Cardiovascular;  Laterality: N/A;  CARDIOVERSION N/A 04/24/2021  Procedure: CARDIOVERSION;  Surgeon: Pricilla Riffle, MD;  Location: [facility] ENDOSCOPY;  Service: Cardiovascular;  Laterality: N/A;  CARDIOVERSION N/A 11/08/2021  Procedure: CARDIOVERSION;  Surgeon: Chrystie Nose, MD;  Location: [facility] ENDOSCOPY;  Service: Cardiovascular;  Laterality: N/A;  CARDIOVERSION N/A 02/06/2022  Procedure: CARDIOVERSION;  Surgeon: Wendall Stade, MD;  Location: [facility] ENDOSCOPY;  Service: Cardiovascular;  Laterality: N/A;  CATARACT EXTRACTION W/ INTRAOCULAR LENS  IMPLANT, BILATERAL Bilateral   CATARACT EXTRACTION, BILATERAL  2012  COLONOSCOPY  06/30/2008  no repeats needed   COLONOSCOPY    had 3 or 4 in the past   CORONARY ARTERY BYPASS GRAFT  2007  "CABG X4"  CYST EXCISION PERINEAL  1980s  HAMMER TOE SURGERY Bilateral   ICD GENERATOR CHANGEOUT N/A 05/30/2023  Procedure: ICD GENERATOR CHANGEOUT;  Surgeon: Duke Salvia, MD;  Location: [facility] INVASIVE CV LAB;   Service: Cardiovascular;  Laterality: N/A;  JOINT REPLACEMENT    KNEE ARTHROPLASTY  07/30/2011  Procedure: COMPUTER ASSISTED TOTAL KNEE ARTHROPLASTY;  Surgeon: Cammy Copa;  Location: [facility] OR;  Service: Orthopedics;  Laterality: Left;  left total knee arthroplasty  MASTECTOMY SUBCUTANEOUS Bilateral   MULTIPLE TOOTH EXTRACTIONS    ORIF FINGER / THUMB FRACTURE Right ~ 1980  "repair of thumb injury"  PROSTATE BIOPSY    REPLACEMENT TOTAL KNEE BILATERAL Bilateral 2012  TEE WITHOUT CARDIOVERSION N/A 03/04/2017  Procedure: TRANSESOPHAGEAL ECHOCARDIOGRAM (TEE);  Surgeon: Kathleene Hazel, MD;  Location: [facility] OR;  Service: Open Heart Surgery;  Laterality: N/A;  TOTAL KNEE REVISION Right 05/18/2019  Procedure: RIGHT PATELLA REVISION/REMOVAL;  Surgeon: Cammy Copa, MD;  Location: [facility] OR;  Service: Orthopedics;  Laterality: Right;  TRANSCATHETER AORTIC VALVE REPLACEMENT, TRANSFEMORAL N/A 03/04/2017  Procedure: TRANSCATHETER AORTIC VALVE REPLACEMENT, TRANSFEMORAL;  Surgeon: Kathleene Hazel, MD;  Location: [facility] OR;  Service: Open Heart Surgery;  Laterality: N/A; Angela Nevin, MA, CCC-SLP Speech Therapy   LOS: 8 days  Total time spent in review of labs and imaging, patient evaluation, formulation of plan, documentation and communication with patient/family: 35 minutes  Lanae Boast, MD [facility] 11/26/2023, 12:53 PM

## 2023-11-27 ENCOUNTER — Telehealth: Payer: Self-pay

## 2023-11-27 DIAGNOSIS — I428 Other cardiomyopathies: Secondary | ICD-10-CM | POA: Diagnosis not present

## 2023-11-27 DIAGNOSIS — K219 Gastro-esophageal reflux disease without esophagitis: Secondary | ICD-10-CM | POA: Diagnosis not present

## 2023-11-27 DIAGNOSIS — K5900 Constipation, unspecified: Secondary | ICD-10-CM | POA: Diagnosis not present

## 2023-11-27 DIAGNOSIS — I4729 Other ventricular tachycardia: Secondary | ICD-10-CM | POA: Diagnosis not present

## 2023-11-27 DIAGNOSIS — Z794 Long term (current) use of insulin: Secondary | ICD-10-CM | POA: Diagnosis not present

## 2023-11-27 DIAGNOSIS — Z79899 Other long term (current) drug therapy: Secondary | ICD-10-CM | POA: Diagnosis not present

## 2023-11-27 DIAGNOSIS — I480 Paroxysmal atrial fibrillation: Secondary | ICD-10-CM | POA: Diagnosis not present

## 2023-11-27 DIAGNOSIS — R0600 Dyspnea, unspecified: Secondary | ICD-10-CM | POA: Diagnosis not present

## 2023-11-27 DIAGNOSIS — D72829 Elevated white blood cell count, unspecified: Secondary | ICD-10-CM | POA: Diagnosis not present

## 2023-11-27 DIAGNOSIS — N390 Urinary tract infection, site not specified: Secondary | ICD-10-CM | POA: Diagnosis not present

## 2023-11-27 DIAGNOSIS — I493 Ventricular premature depolarization: Secondary | ICD-10-CM | POA: Diagnosis not present

## 2023-11-27 DIAGNOSIS — J90 Pleural effusion, not elsewhere classified: Secondary | ICD-10-CM | POA: Diagnosis not present

## 2023-11-27 DIAGNOSIS — Z950 Presence of cardiac pacemaker: Secondary | ICD-10-CM | POA: Diagnosis not present

## 2023-11-27 DIAGNOSIS — I509 Heart failure, unspecified: Secondary | ICD-10-CM | POA: Diagnosis not present

## 2023-11-27 DIAGNOSIS — R0689 Other abnormalities of breathing: Secondary | ICD-10-CM | POA: Diagnosis not present

## 2023-11-27 DIAGNOSIS — M109 Gout, unspecified: Secondary | ICD-10-CM | POA: Diagnosis not present

## 2023-11-27 DIAGNOSIS — J45998 Other asthma: Secondary | ICD-10-CM | POA: Diagnosis not present

## 2023-11-27 DIAGNOSIS — I4891 Unspecified atrial fibrillation: Secondary | ICD-10-CM | POA: Diagnosis not present

## 2023-11-27 DIAGNOSIS — M1990 Unspecified osteoarthritis, unspecified site: Secondary | ICD-10-CM | POA: Diagnosis not present

## 2023-11-27 DIAGNOSIS — N400 Enlarged prostate without lower urinary tract symptoms: Secondary | ICD-10-CM | POA: Diagnosis not present

## 2023-11-27 DIAGNOSIS — R1312 Dysphagia, oropharyngeal phase: Secondary | ICD-10-CM | POA: Diagnosis not present

## 2023-11-27 DIAGNOSIS — N1831 Chronic kidney disease, stage 3a: Secondary | ICD-10-CM | POA: Diagnosis not present

## 2023-11-27 DIAGNOSIS — Z951 Presence of aortocoronary bypass graft: Secondary | ICD-10-CM | POA: Diagnosis not present

## 2023-11-27 DIAGNOSIS — Z09 Encounter for follow-up examination after completed treatment for conditions other than malignant neoplasm: Secondary | ICD-10-CM | POA: Diagnosis not present

## 2023-11-27 DIAGNOSIS — Z95810 Presence of automatic (implantable) cardiac defibrillator: Secondary | ICD-10-CM | POA: Diagnosis not present

## 2023-11-27 DIAGNOSIS — I350 Nonrheumatic aortic (valve) stenosis: Secondary | ICD-10-CM | POA: Diagnosis not present

## 2023-11-27 DIAGNOSIS — J309 Allergic rhinitis, unspecified: Secondary | ICD-10-CM | POA: Diagnosis not present

## 2023-11-27 DIAGNOSIS — J9601 Acute respiratory failure with hypoxia: Secondary | ICD-10-CM | POA: Diagnosis not present

## 2023-11-27 DIAGNOSIS — E039 Hypothyroidism, unspecified: Secondary | ICD-10-CM | POA: Diagnosis not present

## 2023-11-27 DIAGNOSIS — E1169 Type 2 diabetes mellitus with other specified complication: Secondary | ICD-10-CM | POA: Diagnosis not present

## 2023-11-27 DIAGNOSIS — I4821 Permanent atrial fibrillation: Secondary | ICD-10-CM | POA: Diagnosis present

## 2023-11-27 DIAGNOSIS — Z87891 Personal history of nicotine dependence: Secondary | ICD-10-CM | POA: Diagnosis not present

## 2023-11-27 DIAGNOSIS — E119 Type 2 diabetes mellitus without complications: Secondary | ICD-10-CM | POA: Diagnosis not present

## 2023-11-27 DIAGNOSIS — N3000 Acute cystitis without hematuria: Secondary | ICD-10-CM | POA: Diagnosis not present

## 2023-11-27 DIAGNOSIS — B379 Candidiasis, unspecified: Secondary | ICD-10-CM | POA: Diagnosis not present

## 2023-11-27 DIAGNOSIS — E785 Hyperlipidemia, unspecified: Secondary | ICD-10-CM | POA: Diagnosis not present

## 2023-11-27 DIAGNOSIS — J129 Viral pneumonia, unspecified: Secondary | ICD-10-CM | POA: Diagnosis not present

## 2023-11-27 DIAGNOSIS — I5022 Chronic systolic (congestive) heart failure: Secondary | ICD-10-CM | POA: Diagnosis not present

## 2023-11-27 DIAGNOSIS — E876 Hypokalemia: Secondary | ICD-10-CM | POA: Diagnosis not present

## 2023-11-27 DIAGNOSIS — F330 Major depressive disorder, recurrent, mild: Secondary | ICD-10-CM | POA: Diagnosis not present

## 2023-11-27 DIAGNOSIS — R0602 Shortness of breath: Secondary | ICD-10-CM | POA: Diagnosis present

## 2023-11-27 DIAGNOSIS — I2510 Atherosclerotic heart disease of native coronary artery without angina pectoris: Secondary | ICD-10-CM | POA: Diagnosis not present

## 2023-11-27 DIAGNOSIS — Z7901 Long term (current) use of anticoagulants: Secondary | ICD-10-CM | POA: Diagnosis not present

## 2023-11-27 DIAGNOSIS — R051 Acute cough: Secondary | ICD-10-CM | POA: Diagnosis not present

## 2023-11-27 DIAGNOSIS — I5042 Chronic combined systolic (congestive) and diastolic (congestive) heart failure: Secondary | ICD-10-CM | POA: Diagnosis not present

## 2023-11-27 DIAGNOSIS — Z79890 Hormone replacement therapy (postmenopausal): Secondary | ICD-10-CM | POA: Diagnosis not present

## 2023-11-27 DIAGNOSIS — R110 Nausea: Secondary | ICD-10-CM | POA: Diagnosis not present

## 2023-11-27 DIAGNOSIS — I130 Hypertensive heart and chronic kidney disease with heart failure and stage 1 through stage 4 chronic kidney disease, or unspecified chronic kidney disease: Secondary | ICD-10-CM | POA: Diagnosis not present

## 2023-11-27 DIAGNOSIS — I10 Essential (primary) hypertension: Secondary | ICD-10-CM | POA: Diagnosis not present

## 2023-11-27 DIAGNOSIS — E1122 Type 2 diabetes mellitus with diabetic chronic kidney disease: Secondary | ICD-10-CM | POA: Diagnosis not present

## 2023-11-27 DIAGNOSIS — I517 Cardiomegaly: Secondary | ICD-10-CM | POA: Diagnosis not present

## 2023-11-27 DIAGNOSIS — R252 Cramp and spasm: Secondary | ICD-10-CM | POA: Diagnosis not present

## 2023-11-27 DIAGNOSIS — I25810 Atherosclerosis of coronary artery bypass graft(s) without angina pectoris: Secondary | ICD-10-CM | POA: Diagnosis not present

## 2023-11-27 DIAGNOSIS — Z7401 Bed confinement status: Secondary | ICD-10-CM | POA: Diagnosis not present

## 2023-11-27 DIAGNOSIS — R0982 Postnasal drip: Secondary | ICD-10-CM | POA: Diagnosis not present

## 2023-11-27 DIAGNOSIS — F339 Major depressive disorder, recurrent, unspecified: Secondary | ICD-10-CM | POA: Diagnosis not present

## 2023-11-27 DIAGNOSIS — Z952 Presence of prosthetic heart valve: Secondary | ICD-10-CM | POA: Diagnosis not present

## 2023-11-27 DIAGNOSIS — F3341 Major depressive disorder, recurrent, in partial remission: Secondary | ICD-10-CM | POA: Diagnosis not present

## 2023-11-27 DIAGNOSIS — J40 Bronchitis, not specified as acute or chronic: Secondary | ICD-10-CM | POA: Diagnosis not present

## 2023-11-27 DIAGNOSIS — Z743 Need for continuous supervision: Secondary | ICD-10-CM | POA: Diagnosis not present

## 2023-11-27 LAB — CBC WITH DIFFERENTIAL/PLATELET
Abs Immature Granulocytes: 0.25 10*3/uL — ABNORMAL HIGH (ref 0.00–0.07)
Basophils Absolute: 0 10*3/uL (ref 0.0–0.1)
Basophils Relative: 0 %
Eosinophils Absolute: 0.3 10*3/uL (ref 0.0–0.5)
Eosinophils Relative: 1 %
HCT: 41.9 % (ref 39.0–52.0)
Hemoglobin: 13.3 g/dL (ref 13.0–17.0)
Immature Granulocytes: 1 %
Lymphocytes Relative: 7 %
Lymphs Abs: 1.3 10*3/uL (ref 0.7–4.0)
MCH: 26.8 pg (ref 26.0–34.0)
MCHC: 31.7 g/dL (ref 30.0–36.0)
MCV: 84.5 fL (ref 80.0–100.0)
Monocytes Absolute: 1.7 10*3/uL — ABNORMAL HIGH (ref 0.1–1.0)
Monocytes Relative: 8 %
Neutro Abs: 16.9 10*3/uL — ABNORMAL HIGH (ref 1.7–7.7)
Neutrophils Relative %: 83 %
Platelets: 352 10*3/uL (ref 150–400)
RBC: 4.96 MIL/uL (ref 4.22–5.81)
RDW: 17.9 % — ABNORMAL HIGH (ref 11.5–15.5)
WBC: 20.4 10*3/uL — ABNORMAL HIGH (ref 4.0–10.5)
nRBC: 0 % (ref 0.0–0.2)

## 2023-11-27 LAB — BASIC METABOLIC PANEL WITH GFR
Anion gap: 9 (ref 5–15)
BUN: 46 mg/dL — ABNORMAL HIGH (ref 8–23)
CO2: 29 mmol/L (ref 22–32)
Calcium: 7.7 mg/dL — ABNORMAL LOW (ref 8.9–10.3)
Chloride: 96 mmol/L — ABNORMAL LOW (ref 98–111)
Creatinine, Ser: 1.32 mg/dL — ABNORMAL HIGH (ref 0.61–1.24)
GFR, Estimated: 52 mL/min — ABNORMAL LOW (ref >60)
Glucose, Bld: 106 mg/dL — ABNORMAL HIGH (ref 70–99)
Potassium: 3.8 mmol/L (ref 3.5–5.1)
Sodium: 134 mmol/L — ABNORMAL LOW (ref 135–145)

## 2023-11-27 LAB — MAGNESIUM: Magnesium: 2.5 mg/dL — ABNORMAL HIGH (ref 1.7–2.4)

## 2023-11-27 LAB — GLUCOSE, CAPILLARY
Glucose-Capillary: 88 mg/dL (ref 70–99)
Glucose-Capillary: 130 mg/dL — ABNORMAL HIGH (ref 70–99)

## 2023-11-27 MED ORDER — TORSEMIDE 20 MG PO TABS
20.0000 mg | ORAL_TABLET | Freq: Every day | ORAL | Status: DC
Start: 2023-11-28 — End: 2023-12-23

## 2023-11-27 MED ORDER — PREDNISONE 20 MG PO TABS: 20.0000 mg | ORAL_TABLET | Freq: Every day | ORAL | Status: DC

## 2023-11-27 MED ORDER — LEVOTHYROXINE SODIUM 175 MCG PO TABS: 175.0000 ug | ORAL_TABLET | Freq: Every day | ORAL | Status: DC

## 2023-11-27 MED ORDER — PREDNISONE 10 MG PO TABS: ORAL_TABLET | ORAL | Status: DC

## 2023-11-27 NOTE — Progress Notes (Signed)
 RN called receiving facility twice with no answer each time. Left phone number on voicemail for call back.

## 2023-11-27 NOTE — Discharge Summary (Signed)
 Physician Discharge Summary  Justin [name] JXB:[id number] DOB: Jun 10, 1934 DOA: 11/17/2023  PCP: Mast, Man X, NP  Admit date: 11/17/2023 Discharge date: 11/27/2023 Recommendations for Outpatient Follow-up:  Follow up with PCP in 1 weeks-call for appointment Please obtain BMP/CBC in one week Follow-up CT chest in 3 to 4 weeks TSH 4 wks  Discharge Dispo: SNF Discharge Condition: Stable Code Status:   Code Status: Limited: Do not attempt resuscitation (DNR) -DNR-LIMITED -Do Not Intubate/DNI  Diet recommendation:  Diet Order             Diet 2 gram sodium Room service appropriate? Yes; Fluid consistency: Thin  Diet effective now                    Brief/Interim Summary: 89 yo male with PMH BPH, CAD s/p CABG, CHF, HTN, HLD, DMII, AS s/p TAVR, PAF, hypothyroidism who presented with shortness of breath who was recently seen by PCP and also given abx due to symptoms along with prednisone. Patient seen in the ED chest x-ray showed interstitial markings concerning for pneumonia/pulmonary edema troponin 14 BNP 880, due to increased work of breathing needed BiPAP and subsequently admitted for acute hypoxic aspiratory failure from coronavirus non-COVID infection along with background history of systolic CHF, pleural effusion. Seen by pulmonary and cardiology meds adjusted at this time-remains stable on 2 L [place] Remains weak and deconditioned plan for SNF today and auth approved for 11/27/23.  Consultation: Cardiology Pulmonary  Significant testing/procedure: CT chest without 3/26>Extensive airspace disease throughout the right lung compatible with pneumonia. Moderate sized left pleural effusion with compressive atelectasis in the left lower lobe.  Subjective: Seen and examined this morning On 2 L [place] overnight afebrile Labs reviewed WBC remains up 19-20 K creatinine trending up 1.1>1.3 No complaints.  He feels ready for discharge to SNF.  Discharge diagnoses:  Primary problem : acute  hypoxic respiratory failure Coronavirus infection with  OC43 with pneumonia respiratory failure multifactorial- 2/2 coronavirus infection, pneumonia, pleural effusion, acute HFmrEF. Managed with multimodal approach with diuretics.Seen by cardiology GDMT adjusted. Seen by pulmonary, given steroids bronchodilators and antibiotics. S/p high-dose steroids x 5 days through 3/29> then on prednisone taper  40 mg daily x 5 days, 20 mg daily x 5 days, 10 mg for 5 days then stop.    Left pleural effusion: Thoracentesis w/ 1000 cc of bloody fluid removed exudative by LDH.Complete antibiotics.    Acute HFmREF: Cardiology managing, diuresed with IV Lasix off  3/30 w/ good negative net balance and weight further down. Now on torsemide 20 mg dailuy cont same. GDMT with Toprol-XL, losartan.  No Jardiance due to history of UTI.  Device interrogated.  CAD with history of CABG times 11/2005 HTN HLD: No acute issues, PTA on aspirin, Lipitor continue the same.  BP fairly stable, on losartan, Toprol.  History of aortic stenosis status post TAVR 2018: Echo on 3/25 EF 40-45%.  Severe LAE RA, moderately reduced RV, abnormal wall motion in addition to RV pacing.  Normal function of aortic valve prosthesis.  Permanent A-fib LBBB/IVCD Frequent PVCs/NSVT: Rate controlled.on amiodarone 200 daily for nsvt suppression, on Pradaxa for anticoagulation.  Continue Toprol.Followed by EP cardiology outpatient..  Hypothyroidism: TSH 0.1 69 and  FT4 elevated 1.6-Synthroid dose decreased to 175 mcg needs repeat TSH and FT4 in 4 to 6 weeks to review adjust dose.  Asymptomatic bacteriuria: ESBL E. coli but UA with WBC 0-5 and asymptomatic.  No indication for treatment  CKD stage IIIa: Baseline 1.3-1.4,  stable, monitor level fluctuating.  GERD: Continue PPI  Depression: Continue her home Effexor recently started  Type 2 diabetes mellitus:  A1c stable On sliding scale insulin here. Stable    Discharge Exam: Vitals:    11/27/23 0917 11/27/23 0921  BP:    Pulse:    Resp:    Temp:    SpO2: 97% 96%   General: Pt is alert, awake, not in acute distress Cardiovascular: RRR, S1/S2 +, no rubs, no gallops Respiratory: CTA bilaterally, no wheezing, no rhonchi Abdominal: Soft, NT, ND, bowel sounds + Extremities: no edema, no cyanosis  Discharge Instructions  Discharge Instructions     (HEART FAILURE PATIENTS) Call MD:  Anytime you have any of the following symptoms: 1) 3 pound weight gain in 24 hours or 5 pounds in 1 week 2) shortness of breath, with or without a dry hacking cough 3) swelling in the hands, feet or stomach 4) if you have to sleep on extra pillows at night in order to breathe.   Complete by: As directed    Discharge instructions   Complete by: As directed    Please call call MD or return to ER for similar or worsening recurring problem that brought you to hospital or if any fever,nausea/vomiting,abdominal pain, uncontrolled pain, chest pain,  shortness of breath or any other alarming symptoms.  Please follow-up your doctor as instructed in a week time and call the office for appointment.  Please avoid alcohol, smoking, or any other illicit substance and maintain healthy habits including taking your regular medications as prescribed.  You were cared for by a hospitalist during your hospital stay. If you have any questions about your discharge medications or the care you received while you were in the hospital after you are discharged, you can call the unit and ask to speak with the hospitalist on call if the hospitalist that took care of you is not available.  Once you are discharged, your primary care physician will handle any further medical issues. Please note that NO REFILLS for any discharge medications will be authorized once you are discharged, as it is imperative that you return to your primary care physician (or establish a relationship with a primary care physician if you do not have one)  for your aftercare needs so that they can reassess your need for medications and monitor your lab values   Increase activity slowly   Complete by: As directed       Allergies as of 11/27/2023       Reactions   Peanut-containing Drug Products Anaphylaxis   Sulfonamide Derivatives Anaphylaxis   Amlodipine Swelling, Other (See Comments)   Swelling in ankles   Eliquis [apixaban] Other (See Comments)   Back/hip pain   Lisinopril Cough   Xarelto [rivaroxaban] Other (See Comments)   Back/hip pain        Medication List     STOP taking these medications    benzonatate 200 MG capsule Commonly known as: TESSALON   dextromethorphan-guaiFENesin 30-600 MG 12hr tablet Commonly known as: MUCINEX DM   doxycycline 100 MG tablet Commonly known as: VIBRA-TABS   Farxiga 10 MG Tabs tablet Generic drug: dapagliflozin propanediol   omeprazole 20 MG capsule Commonly known as: PRILOSEC       TAKE these medications    albuterol 108 (90 Base) MCG/ACT inhaler Commonly known as: VENTOLIN HFA Inhale 2 puffs into the lungs every 4 (four) hours as needed for wheezing or shortness of breath.   allopurinol 100 MG  tablet Commonly known as: ZYLOPRIM Take 1 tablet (100 mg total) by mouth daily.   amiodarone 200 MG tablet Commonly known as: PACERONE Take 1 tablet (200 mg total) by mouth daily. What changed:  how much to take how to take this when to take this additional instructions   aspirin 81 MG tablet Take 1 tablet (81 mg total) by mouth daily.   atorvastatin 20 MG tablet Commonly known as: LIPITOR TAKE ONE TABLET ONCE DAILY   CAL MAG ZINC +D3 PO Take 2 tablets by mouth in the morning. 500 mg / 7 mg / 25 mcg   cetirizine 10 MG tablet Commonly known as: ZYRTEC Take 10 mg by mouth 2 (two) times daily.   colchicine 0.6 MG tablet Take 1 tablet (0.6 mg total) by mouth every 6 (six) hours as needed (gout).   dabigatran 150 MG Caps capsule Commonly known as: Pradaxa TAKE (1)  CAPSULE TWICE DAILY.   Dexcom G6 Receiver Devi 1 Device by Does not apply route 3 (three) times daily. E11.69   Dexcom G6 Sensor Misc 1 Device by Does not apply route 3 (three) times daily. E11.69   Dexcom G6 Transmitter Misc 1 Device by Does not apply route 3 (three) times daily. E11.69   isosorbide mononitrate 30 MG 24 hr tablet Commonly known as: IMDUR Take 1 tablet (30 mg total) by mouth daily.   ketotifen 0.025 % ophthalmic solution Commonly known as: ZADITOR Place 1 drop into both eyes daily as needed (for itching).   levothyroxine 175 MCG tablet Commonly known as: SYNTHROID Take 1 tablet (175 mcg total) by mouth daily before breakfast. Start taking on: November 28, 2023 What changed:  medication strength how much to take when to take this   losartan 25 MG tablet Commonly known as: Cozaar Take 0.5 tablets (12.5 mg total) by mouth daily.   magic mouthwash Soln Take 5 mLs by mouth as directed. Suspension contains equal amounts of Maalox Extra Strength, nystatin, and diphenhydramine. RX'ed by dentist   methocarbamol 500 MG tablet Commonly known as: ROBAXIN Take 1 tablet (500 mg total) by mouth 4 (four) times daily as needed for muscle spasms.   metoCLOPramide 10 MG tablet Commonly known as: REGLAN TAKE ONE TABLET BY MOUTH EVERY MORNING WITH BREAKFAST   metolazone 2.5 MG tablet Commonly known as: ZAROXOLYN Take 1 tablet (2.5 mg total) by mouth once a week every Saturday   metoprolol succinate 50 MG 24 hr tablet Commonly known as: TOPROL-XL Take 1 tablet (50 mg total) by mouth daily. Take with or immediately following a meal. What changed: how much to take   montelukast 10 MG tablet Commonly known as: SINGULAIR TAKE ONE TABLET BY MOUTH ONCE DAILY IN THE EVENING   Multi-Vitamin Gummies Chew Chew 2 tablets by mouth See admin instructions. Chew 2 gummies by mouth in the morning   PreserVision AREDS 2 Caps Take 1 capsule by mouth in the morning and at bedtime.    pantoprazole 40 MG tablet Commonly known as: PROTONIX Take 1 tablet (40 mg total) by mouth daily.   potassium chloride SA 20 MEQ tablet Commonly known as: KLOR-CON M Take 1 tablet (20 mEq total) by mouth 2 (two) times daily.   predniSONE 10 MG tablet Commonly known as: DELTASONE Take 2 tabs daily x 5 days then 1 tab daily x 5 days then stop Start taking on: November 28, 2023 What changed:  medication strength how much to take how to take this when to take this additional  instructions   silver sulfADIAZINE 1 % cream Commonly known as: Silvadene Apply 1 Application topically 2 (two) times daily.   spironolactone 25 MG tablet Commonly known as: ALDACTONE Take 1 tablet (25 mg total) by mouth daily.   torsemide 20 MG tablet Commonly known as: DEMADEX Take 1 tablet (20 mg total) by mouth daily. Start taking on: November 28, 2023 What changed:  how much to take how to take this when to take this additional instructions   triamcinolone cream 0.1 % Commonly known as: KENALOG Apply 1 application  topically daily as needed for dry skin (affected areas).   valACYclovir 500 MG tablet Commonly known as: VALTREX Take 500 mg by mouth 2 (two) times daily as needed (cold sores).   venlafaxine XR 37.5 MG 24 hr capsule Commonly known as: Effexor XR Take 1 capsule (37.5 mg total) by mouth daily with breakfast.        Contact information for follow-up providers     Mast, Man X, NP Follow up in 1 week(s).   Specialty: Internal Medicine Contact information: [address] [place] [address] [phone]              Contact information for after-discharge care     Destination     [facility] .   Service: Skilled Nursing Contact information: [address] [place] [address] [phone]                    Allergies  Allergen Reactions   Peanut-Containing Drug Products Anaphylaxis   Sulfonamide Derivatives  Anaphylaxis   Amlodipine Swelling and Other (See Comments)    Swelling in ankles   Eliquis [Apixaban] Other (See Comments)    Back/hip pain   Lisinopril Cough   Xarelto [Rivaroxaban] Other (See Comments)    Back/hip pain    The results of significant diagnostics from this hospitalization (including imaging, microbiology, ancillary and laboratory) are listed below for reference.    Microbiology: Recent Results (from the past 240 hours)  Respiratory (~20 pathogens) panel by PCR     Status: Abnormal   Collection Time: 11/18/23  2:22 AM   Specimen: Nasopharyngeal Swab; Respiratory  Result Value Ref Range Status   Adenovirus NOT DETECTED NOT DETECTED Final   Coronavirus 229E NOT DETECTED NOT DETECTED Final    Comment: (NOTE) The Coronavirus on the Respiratory Panel, DOES NOT test for the novel  Coronavirus (2019 nCoV)    Coronavirus HKU1 NOT DETECTED NOT DETECTED Final   Coronavirus NL63 NOT DETECTED NOT DETECTED Final   Coronavirus OC43 DETECTED (A) NOT DETECTED Final   Metapneumovirus NOT DETECTED NOT DETECTED Final   Rhinovirus / Enterovirus NOT DETECTED NOT DETECTED Final   Influenza A NOT DETECTED NOT DETECTED Final   Influenza B NOT DETECTED NOT DETECTED Final   Parainfluenza Virus 1 NOT DETECTED NOT DETECTED Final   Parainfluenza Virus 2 NOT DETECTED NOT DETECTED Final   Parainfluenza Virus 3 NOT DETECTED NOT DETECTED Final   Parainfluenza Virus 4 NOT DETECTED NOT DETECTED Final   Respiratory Syncytial Virus NOT DETECTED NOT DETECTED Final   Bordetella pertussis NOT DETECTED NOT DETECTED Final   Bordetella Parapertussis NOT DETECTED NOT DETECTED Final   Chlamydophila pneumoniae NOT DETECTED NOT DETECTED Final   Mycoplasma pneumoniae NOT DETECTED NOT DETECTED Final    Comment: Performed at [facility], [address], [place], [place] [address]  Resp panel by RT-PCR (RSV, Flu A&B, Covid) Anterior Nasal Swab  Status: None   Collection Time: 11/18/23  2:22 AM    Specimen: Anterior Nasal Swab  Result Value Ref Range Status   SARS Coronavirus 2 by RT PCR NEGATIVE NEGATIVE Final    Comment: (NOTE) SARS-CoV-2 target nucleic acids are NOT DETECTED.  The SARS-CoV-2 RNA is generally detectable in upper respiratory specimens during the acute phase of infection. The lowest concentration of SARS-CoV-2 viral copies this assay can detect is 138 copies/mL. A negative result does not preclude SARS-Cov-2 infection and should not be used as the sole basis for treatment or other patient management decisions. A negative result may occur with  improper specimen collection/handling, submission of specimen other than nasopharyngeal swab, presence of viral mutation(s) within the areas targeted by this assay, and inadequate number of viral copies(<138 copies/mL). A negative result must be combined with clinical observations, patient history, and epidemiological information. The expected result is Negative.  Fact Sheet for Patients:  BloggerCourse.com  Fact Sheet for Healthcare Providers:  SeriousBroker.it  This test is no t yet approved or cleared by the [place] FDA and  has been authorized for detection and/or diagnosis of SARS-CoV-2 by FDA under an Emergency Use Authorization (EUA). This EUA will remain  in effect (meaning this test can be used) for the duration of the COVID-19 declaration under Section 564(b)(1) of the Act, 21 U.S.C.section 360bbb-3(b)(1), unless the authorization is terminated  or revoked sooner.       Influenza A by PCR NEGATIVE NEGATIVE Final   Influenza B by PCR NEGATIVE NEGATIVE Final    Comment: (NOTE) The Xpert Xpress SARS-CoV-2/FLU/RSV plus assay is intended as an aid in the diagnosis of influenza from Nasopharyngeal swab specimens and should not be used as a sole basis for treatment. Nasal washings and aspirates are unacceptable for Xpert Xpress  SARS-CoV-2/FLU/RSV testing.  Fact Sheet for Patients: BloggerCourse.com  Fact Sheet for Healthcare Providers: SeriousBroker.it  This test is not yet approved or cleared by the [place] FDA and has been authorized for detection and/or diagnosis of SARS-CoV-2 by FDA under an Emergency Use Authorization (EUA). This EUA will remain in effect (meaning this test can be used) for the duration of the COVID-19 declaration under Section 564(b)(1) of the Act, 21 U.S.C. section 360bbb-3(b)(1), unless the authorization is terminated or revoked.     Resp Syncytial Virus by PCR NEGATIVE NEGATIVE Final    Comment: (NOTE) Fact Sheet for Patients: BloggerCourse.com  Fact Sheet for Healthcare Providers: SeriousBroker.it  This test is not yet approved or cleared by the [place] FDA and has been authorized for detection and/or diagnosis of SARS-CoV-2 by FDA under an Emergency Use Authorization (EUA). This EUA will remain in effect (meaning this test can be used) for the duration of the COVID-19 declaration under Section 564(b)(1) of the Act, 21 U.S.C. section 360bbb-3(b)(1), unless the authorization is terminated or revoked.  Performed at [facility], [address], [place], [place] [address]   Culture, blood (Routine X 2) w Reflex to ID Panel     Status: None   Collection Time: 11/18/23 10:12 AM   Specimen: BLOOD  Result Value Ref Range Status   Specimen Description   Final    BLOOD BLOOD RIGHT ARM AEROBIC BOTTLE ONLY ANAEROBIC BOTTLE ONLY Performed at [facility], [address], [place], [place] [address]    Special Requests   Final    BOTTLES DRAWN AEROBIC AND ANAEROBIC Blood Culture results may not be optimal due to an inadequate volume  of blood received in culture bottles Performed at [facility], [address],  [place], [place] [address]    Culture   Final    NO GROWTH 5 DAYS Performed at [facility], [address] [place], [place] [address]    Report Status 11/23/2023 FINAL  Final  Culture, blood (Routine X 2) w Reflex to ID Panel     Status: None   Collection Time: 11/18/23 10:12 AM   Specimen: BLOOD  Result Value Ref Range Status   Specimen Description   Final    BLOOD BLOOD RIGHT HAND AEROBIC BOTTLE ONLY ANAEROBIC BOTTLE ONLY Performed at [facility], [address] [place], [place] [address]    Special Requests   Final    BOTTLES DRAWN AEROBIC AND ANAEROBIC Blood Culture results may not be optimal due to an inadequate volume of blood received in culture bottles Performed at [facility], [address], [place], [place] [address]    Culture   Final    NO GROWTH 5 DAYS Performed at [facility], [address] [place], [place] [address]    Report Status 11/23/2023 FINAL  Final  Expectorated Sputum Assessment w Gram Stain, Rflx to Resp Cult     Status: None   Collection Time: 11/18/23 12:30 PM   Specimen: Sputum  Result Value Ref Range Status   Specimen Description SPUTUM  Final   Special Requests Normal  Final   Sputum evaluation   Final    THIS SPECIMEN IS ACCEPTABLE FOR SPUTUM CULTURE Performed at [facility], [address] [place], [place] [address]    Report Status 11/18/2023 FINAL  Final  Culture, Respiratory w Gram Stain     Status: None   Collection Time: 11/18/23 12:30 PM   Specimen: SPU  Result Value Ref Range Status   Specimen Description   Final    SPUTUM Performed at [facility], [address], [place], [place] [address]    Special Requests   Final    Normal Reflexed from [id number] Performed at [facility], [address], [place], [place] [address]    Gram Stain NO WBC SEEN NO ORGANISMS SEEN   Final   Culture   Final    Normal respiratory flora-no  Staph aureus or Pseudomonas seen Performed at [facility], [address], [place], [place] [address]    Report Status 11/20/2023 FINAL  Final  Urine Culture (for pregnant, neutropenic or urologic patients or patients with an indwelling urinary catheter)     Status: Abnormal   Collection Time: 11/18/23 12:49 PM   Specimen: Urine, Clean Catch  Result Value Ref Range Status   Specimen Description   Final    URINE, CLEAN CATCH Performed at [facility], [address] [place], [place] [address]    Special Requests   Final    NONE Performed at [facility], [address] [place], [place] [address]    Culture (A)  Final    >=100,000 COLONIES/mL ESCHERICHIA COLI Confirmed Extended Spectrum Beta-Lactamase Producer (ESBL).  In bloodstream infections from ESBL organisms, carbapenems are preferred over piperacillin/tazobactam. They are shown to have a lower risk of mortality.    Report Status 11/20/2023 FINAL  Final   Organism ID, Bacteria ESCHERICHIA COLI (A)  Final      Susceptibility   Escherichia coli - MIC*    AMPICILLIN >=32 RESISTANT Resistant  CEFAZOLIN >=64 RESISTANT Resistant     CEFEPIME 16 RESISTANT Resistant     CEFTRIAXONE >=64 RESISTANT Resistant     CIPROFLOXACIN >=4 RESISTANT Resistant     GENTAMICIN >=16 RESISTANT Resistant     IMIPENEM <=0.25 SENSITIVE Sensitive     NITROFURANTOIN <=16 SENSITIVE Sensitive     TRIMETH/SULFA >=320 RESISTANT Resistant     AMPICILLIN/SULBACTAM >=32 RESISTANT Resistant     PIP/TAZO 64 INTERMEDIATE Intermediate ug/mL    * >=100,000 COLONIES/mL ESCHERICHIA COLI  Body fluid culture w Gram Stain     Status: None   Collection Time: 11/19/23  5:47 PM   Specimen: Pleural Fluid  Result Value Ref Range Status   Specimen Description   Final    PLEURAL LEFT LUNG Performed at [facility], [address] [place], [place] [address]    Special Requests   Final    NONE Performed  at [facility], [address], [place], [place] [address]    Gram Stain   Final    RARE WBC PRESENT, PREDOMINANTLY MONONUCLEAR NO ORGANISMS SEEN    Culture   Final    NO GROWTH 3 DAYS Performed at [facility], [address] [place], [place] [address]    Report Status 11/23/2023 FINAL  Final  MRSA Next Gen by PCR, Nasal     Status: None   Collection Time: 11/20/23 11:10 PM   Specimen: Nasal Mucosa; Nasal Swab  Result Value Ref Range Status   MRSA by PCR Next Gen NOT DETECTED NOT DETECTED Final    Comment: (NOTE) The GeneXpert MRSA Assay (FDA approved for NASAL specimens only), is one component of a comprehensive MRSA colonization surveillance program. It is not intended to diagnose MRSA infection nor to guide or monitor treatment for MRSA infections. Test performance is not FDA approved in patients less than 50 years old. Performed at [facility], [address], [place], [place] [address]     Procedures/Studies: Varney Biles Swallowing Func-Speech Pathology Result Date: 11/24/2023 Table formatting from the original result was not included. Modified Barium Swallow Study Patient Details Name: Justin [name] MRN: [id number] Date of Birth: 01/01/1934 Today's Date: 11/24/2023 HPI/PMH: HPI: 89 yo presenting 3/24 with shortness of breath. CXR showed asymmetric interstitial opacities concerning for atypical pneumonia versus pulmonary edema or both. RVP swab positive for coronavirus OC 43.  He also became more hypoxic requiring salter high flow. PMH includes: GERD, BPH, CAD s/p CABG, CHF, HTN, HLD, DMII, AS s/p TAVR, PAF, hypothyroidism Clinical Impression: Clinical Impression: Patient presents with an oropharyngeal and an esophageal dysphagia as per this MBS. Mastication and oral transit of solid and puree boluses was [facility]. Swallow was initiated at level of vallecular sinus with puree and mechanical soft solids and honey thick liquids, initiated at level of  posterior laryngeal surface of the epiglottis with nectar thick liquids and delayed at level of pyriform sinus with thin liquids. Anterior hyoid excursion, laryngeal elevation both partial in completion. With small, single sips of thin liquids, no aspiration observed but with consecutive sips, patient with sensed aspiration (PAS 7) with thin liquids secondary to inadequate and mistimed closure of laryngeal vestibule. Chin tuck posture was not beneficial to reduce aspiration risk or amount. Esophageal sweep revealed retrograde movement and stasis of barium in distal esophagus. SLP educated patient on results of this MBS, recommendation for small, single cup sips of thin liquids and not straws, no consecutive cup sips of thin liquids. SLP also recommending  management of patient's GERD and for him to follow general GERD/Reflux precautions such as not laying down flat after eating, sitting upright and remaining upright for 45 minutes after meals. Patient is at risk for post-prandial aspiration. SLP will continue to follow. Factors that may increase risk of adverse event in presence of aspiration Rubye Oaks & Clearance Coots 2021): Factors that may increase risk of adverse event in presence of aspiration Rubye Oaks & Clearance Coots 2021): Respiratory or GI disease; Poor general health and/or compromised immunity; Frail or deconditioned Recommendations/Plan: Swallowing Evaluation Recommendations Swallowing Evaluation Recommendations Recommendations: PO diet PO Diet Recommendation: Regular; Thin liquids (Level 0) Liquid Administration via: No straw; Cup Medication Administration: Whole meds with puree Supervision: Patient able to self-feed Swallowing strategies  : Slow rate; Small bites/sips Postural changes: Position pt fully upright for meals; Stay upright 30-60 min after meals Oral care recommendations: Oral care BID (2x/day) Treatment Plan Treatment Plan Treatment recommendations: Therapy as outlined in treatment plan below Follow-up  recommendations: Skilled nursing-short term rehab (<3 hours/day) Functional status assessment: Patient has had a recent decline in their functional status and demonstrates the ability to make significant improvements in function in a reasonable and predictable amount of time. Treatment frequency: Min 2x/week Treatment duration: 1 week Interventions: Aspiration precaution training; Diet toleration management by SLP; Patient/family education Recommendations Recommendations for follow up therapy are one component of a multi-disciplinary discharge planning process, led by the attending physician.  Recommendations may be updated based on patient status, additional functional criteria and insurance authorization. Assessment: Orofacial Exam: No data recorded Anatomy: Anatomy: Prominent cricopharyngeus Boluses Administered: Boluses Administered Boluses Administered: Thin liquids (Level 0); Mildly thick liquids (Level 2, nectar thick); Moderately thick liquids (Level 3, honey thick); Puree; Solid  Oral Impairment Domain: Oral Impairment Domain Lip Closure: No labial escape Tongue control during bolus hold: Not tested Bolus preparation/mastication: Timely and efficient chewing and mashing Bolus transport/lingual motion: Brisk tongue motion Oral residue: Complete oral clearance Location of oral residue : N/A Initiation of pharyngeal swallow : Valleculae; Pyriform sinuses  Pharyngeal Impairment Domain: Pharyngeal Impairment Domain Soft palate elevation: No bolus between soft palate (SP)/pharyngeal wall (PW) Laryngeal elevation: Partial superior movement of thyroid cartilage/partial approximation of arytenoids to epiglottic petiole Anterior hyoid excursion: Partial anterior movement Epiglottic movement: Complete inversion Laryngeal vestibule closure: Incomplete, narrow column air/contrast in laryngeal vestibule Pharyngeal stripping wave : Present - diminished Pharyngeal contraction (A/P view only): N/A Pharyngoesophageal segment  opening: Complete distension and complete duration, no obstruction of flow Tongue base retraction: No contrast between tongue base and posterior pharyngeal wall (PPW) Pharyngeal residue: Collection of residue within or on pharyngeal structures Location of pharyngeal residue: Valleculae; Pyriform sinuses; Pharyngeal wall  Esophageal Impairment Domain: Esophageal Impairment Domain Esophageal clearance upright position: Complete clearance, esophageal coating Pill: Pill Consistency administered: Puree Puree: WFL Penetration/Aspiration Scale Score: Penetration/Aspiration Scale Score 1.  Material does not enter airway: Mildly thick liquids (Level 2, nectar thick); Moderately thick liquids (Level 3, honey thick); Puree; Solid 5.  Material enters airway, CONTACTS cords and not ejected out: Thin liquids (Level 0) 7.  Material enters airway, passes BELOW cords and not ejected out despite cough attempt by patient: Thin liquids (Level 0) Compensatory Strategies: Compensatory Strategies Compensatory strategies: Yes Chin tuck: Ineffective Ineffective Chin Tuck: Thin liquid (Level 0)   General Information: No data recorded Diet Prior to this Study: Regular; Thin liquids (Level 0)   Temperature : Normal   No data recorded  Supplemental O2: Nasal cannula   History of Recent Intubation: No  Behavior/Cognition: Alert; Cooperative; Pleasant mood Self-Feeding Abilities: Able to self-feed Baseline vocal quality/speech: Normal Volitional Cough: Able to elicit Volitional Swallow: Able to elicit Exam Limitations: No limitations Goal Planning: Prognosis for improved oropharyngeal function: Good No data recorded No data recorded Patient/Family Stated Goal: plan is return to SNF Consulted and agree with results and recommendations: Patient Pain: Pain Assessment Pain Assessment: No/denies pain End of Session: Start Time:SLP Start Time (ACUTE ONLY): 1445 Stop Time: SLP Stop Time (ACUTE ONLY): 1505 Time Calculation:SLP Time Calculation (min)  (ACUTE ONLY): 20 min Charges: SLP Evaluations $ SLP Speech Visit: 1 Visit SLP Evaluations $MBS Swallow: 1 Procedure SLP visit diagnosis: SLP Visit Diagnosis: Dysphagia, oropharyngeal phase (R13.12); Dysphagia, pharyngoesophageal phase (R13.14) Past Medical History: Past Medical History: Diagnosis Date  Age-related macular degeneration, dry, both eyes   Allergic   "24/7; 365 days/year; I'm allergic to pollens, dust, all southern grasses/trees, mold, mildue, cats, dogs" (01/07/2017)  Anal fissure   Asthma   sees Dr. Kendrick Fries   Benign prostatic hypertrophy   (sees Dr. Chester Holstein  CAD (coronary artery disease)   a. s/p CABG 2007. b. Cath 08/2016 - 4/4 patent grafts.  Carotid bruit   carotid u/s 10/10: 0.39% bilaterally  Chronic combined systolic and diastolic CHF (congestive heart failure) (HCC)   Complication of anesthesia 1980s  "w/anal cyst OR, he gave me a saddle block then put a narcotic in spinal cord; had a severe reaction to that" (01/07/2017)  Congestive heart failure (CHF) [facility])   ED (erectile dysfunction)   Family history of adverse reaction to anesthesia   "daughter wakes up during OR" (01/07/2017)  GERD (gastroesophageal reflux disease)   Gout   HTN (hypertension)   Hx of colonic polyps   (sees Dr. Marina Goodell)  Hyperlipidemia   Hypothyroidism   Moderate to severe aortic stenosis   a. s/p TAVR 02/2017.  Myocardial infarction [facility]) ~ 2000  Obesity   Osteoarthritis   "was in my knees, hands" (01/07/2017 )  PAF (paroxysmal atrial fibrillation) (HCC)   a. documented post TAVR.  Precancerous skin lesion   (sees Dr. 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  29 mm Edwards Sapien 3 transcatheter heart valve placed via percutaneous right transfemoral approach Past Surgical History: Past Surgical History: Procedure Laterality Date  ANUS SURGERY    "opened it back up cause it wouldn't heal; wound up w/a fissure" (01/07/2017)  BIV ICD INSERTION  CRT-D N/A 05/02/2017  Procedure: BIV ICD INSERTION CRT-D;  Surgeon: Duke Salvia, MD;  Location: [facility] INVASIVE CV LAB;  Service: Cardiovascular;  Laterality: N/A;  CARDIAC CATHETERIZATION  10/29/2005  CARDIAC CATHETERIZATION N/A 08/27/2016  Procedure: Right/Left Heart Cath and Coronary/Graft Angiography;  Surgeon: Kathleene Hazel, MD;  Location: [facility] INVASIVE CV LAB;  Service: Cardiovascular;  Laterality: N/A;  CARDIOVERSION N/A 04/24/2021  Procedure: CARDIOVERSION;  Surgeon: Pricilla Riffle, MD;  Location: [facility] ENDOSCOPY;  Service: Cardiovascular;  Laterality: N/A;  CARDIOVERSION N/A 11/08/2021  Procedure: CARDIOVERSION;  Surgeon: Chrystie Nose, MD;  Location: [facility] ENDOSCOPY;  Service: Cardiovascular;  Laterality: N/A;  CARDIOVERSION N/A 02/06/2022  Procedure: CARDIOVERSION;  Surgeon: Wendall Stade, MD;  Location: [facility] ENDOSCOPY;  Service: Cardiovascular;  Laterality: N/A;  CATARACT EXTRACTION W/ INTRAOCULAR LENS  IMPLANT, BILATERAL Bilateral   CATARACT EXTRACTION, BILATERAL  2012  COLONOSCOPY  06/30/2008  no repeats needed   COLONOSCOPY    had 3 or 4 in the past   CORONARY  ARTERY BYPASS GRAFT  2007  "CABG X4"  CYST EXCISION PERINEAL  1980s  HAMMER TOE SURGERY Bilateral   ICD GENERATOR CHANGEOUT N/A 05/30/2023  Procedure: ICD GENERATOR CHANGEOUT;  Surgeon: Duke Salvia, MD;  Location: [facility] INVASIVE CV LAB;  Service: Cardiovascular;  Laterality: N/A;  JOINT REPLACEMENT    KNEE ARTHROPLASTY  07/30/2011  Procedure: COMPUTER ASSISTED TOTAL KNEE ARTHROPLASTY;  Surgeon: Cammy Copa;  Location: [facility] OR;  Service: Orthopedics;  Laterality: Left;  left total knee arthroplasty  MASTECTOMY SUBCUTANEOUS Bilateral   MULTIPLE TOOTH EXTRACTIONS    ORIF FINGER / THUMB FRACTURE Right ~ 1980  "repair of thumb injury"  PROSTATE BIOPSY    REPLACEMENT TOTAL KNEE BILATERAL Bilateral 2012  TEE WITHOUT CARDIOVERSION N/A 03/04/2017  Procedure: TRANSESOPHAGEAL ECHOCARDIOGRAM (TEE);  Surgeon: Kathleene Hazel, MD;  Location: [facility] OR;   Service: Open Heart Surgery;  Laterality: N/A;  TOTAL KNEE REVISION Right 05/18/2019  Procedure: RIGHT PATELLA REVISION/REMOVAL;  Surgeon: Cammy Copa, MD;  Location: [facility] OR;  Service: Orthopedics;  Laterality: Right;  TRANSCATHETER AORTIC VALVE REPLACEMENT, TRANSFEMORAL N/A 03/04/2017  Procedure: TRANSCATHETER AORTIC VALVE REPLACEMENT, TRANSFEMORAL;  Surgeon: Kathleene Hazel, MD;  Location: [facility] OR;  Service: Open Heart Surgery;  Laterality: N/A; Angela Nevin, MA, CCC-SLP Speech Therapy   DG CHEST PORT 1 VIEW Result Date: 11/20/2023 CLINICAL DATA:  Hypoxia. EXAM: PORTABLE CHEST 1 VIEW COMPARISON:  November 19, 2023. FINDINGS: Stable cardiomegaly. Status post coronary artery bypass graft and transcatheter aortic valve repair. Left-sided defibrillator is unchanged. Minimal left basilar subsegmental atelectasis and effusion is noted. Stable mild diffuse right lung opacity is noted concerning for pneumonia. Bony thorax is unremarkable. IMPRESSION: Minimal left basilar subsegmental atelectasis and left pleural effusion. Stable mild right diffuse lung opacities as noted above. Electronically Signed   By: Lupita Raider M.D.   On: 11/20/2023 12:00   DG CHEST PORT 1 VIEW Result Date: 11/19/2023 CLINICAL DATA:  [id number] S/P thoracentesis [id number] EXAM: PORTABLE CHEST 1 VIEW COMPARISON:  Chest x-ray 11/18/2023, CT chest 11/19/2023 FINDINGS: Left chest wall triple lead pacemaker defibrillator. The heart and mediastinal contours are unchanged. Atherosclerotic plaque. Aortic valve replacement. Persistent patchy right lung airspace opacities. No pulmonary edema. Interval decrease in trace left pleural effusion. No definite right pleural effusion. No pneumothorax. No acute osseous abnormality.  Sternotomy wires are intact. IMPRESSION: 1. Persistent patchy right lung airspace opacities better evaluated on CT chest 11/19/2023. 2. Interval decrease in trace left pleural effusion. 3.  Aortic Atherosclerosis  (ICD10-I70.0). Electronically Signed   By: Tish Frederickson M.D.   On: 11/19/2023 19:48   CT CHEST WO CONTRAST Result Date: 11/19/2023 CLINICAL DATA:  Pneumonia, complications suspected. EXAM: CT CHEST WITHOUT CONTRAST TECHNIQUE: Multidetector CT imaging of the chest was performed following the standard protocol without IV contrast. RADIATION DOSE REDUCTION: This exam was performed according to the departmental dose-optimization program which includes automated exposure control, adjustment of the mA and/or kV according to patient size and/or use of iterative reconstruction technique. COMPARISON:  Chest radiograph 11/18/2023 and cardiac CT 10/28/2016 FINDINGS: Cardiovascular: Left chest ICD. Patient has biventricular leads. Status post TAVR. Heart size is within normal limits. No significant pericardial effusion. Status post CABG. Atherosclerotic calcifications in thoracic aorta without aneurysm. Prominent atherosclerotic calcifications in the proximal abdominal aorta particularly near the origin of the celiac trunk and SMA. Again noted is a tortuous innominate artery without prominent calcifications involving the innominate artery and proximal right subclavian artery. Mediastinum/Nodes: Small mediastinal lymph nodes. No significant hilar  lymphadenopathy but limited evaluation without intravascular contrast. No significant axillary lymph node enlargement. Lungs/Pleura: Moderate sized left pleural effusion with compressive atelectasis in left lower lobe. Trachea and mainstem bronchi are patent. Extensive airspace disease throughout the right lung involving right upper lobe, right middle lobe and right lower lobe. Near complete collapse of the left lower lobe. Upper Abdomen: Images of the upper abdomen are unremarkable. Musculoskeletal: No acute bone abnormality. IMPRESSION: 1. Extensive airspace disease throughout the right lung compatible with pneumonia. 2. Moderate sized left pleural effusion with compressive  atelectasis in the left lower lobe. 3. Aortic Atherosclerosis (ICD10-I70.0). Severe calcified plaque in the abdominal aorta at the origin of the visualized visceral arteries. Electronically Signed   By: Richarda Overlie M.D.   On: 11/19/2023 11:03   DG CHEST PORT 1 VIEW Result Date: 11/18/2023 CLINICAL DATA:  Hypoxia EXAM: PORTABLE CHEST 1 VIEW COMPARISON:  11/17/2023 FINDINGS: Left-sided implanted cardiac device. Stable cardiomegaly status post sternotomy, CABG, and TAVR. Aortic atherosclerosis. Airspace opacity throughout the right lung is unchanged. Improving aeration of the left lung base compared to prior. No appreciable pleural fluid collection. No pneumothorax. IMPRESSION: Airspace opacity throughout the right lung is unchanged. Improving aeration of the left lung base compared to prior. Electronically Signed   By: Duanne Guess D.O.   On: 11/18/2023 17:54   DG Abd 1 View Result Date: 11/18/2023 CLINICAL DATA:  Abdominal distension. EXAM: ABDOMEN - 1 VIEW COMPARISON:  Pelvic radiograph dated 02/27/2022. FINDINGS: Evaluation is limited due to body habitus. Moderate stool and air throughout the colon. No bowel dilatation or evidence of obstruction. Degenerative changes of the spine. No acute osseous pathology. IMPRESSION: Nonobstructive bowel gas pattern. Electronically Signed   By: Elgie Collard M.D.   On: 11/18/2023 15:14   ECHOCARDIOGRAM LIMITED Result Date: 11/18/2023    ECHOCARDIOGRAM LIMITED REPORT   Patient Name:   Justin [name] Date of Exam: 11/18/2023 Medical Rec #:  [id number]        Height:       66.5 in Accession #:    [id number]       Weight:       246.0 lb Date of Birth:  April 29, 1934       BSA:          2.196 m Patient Age:    89 years         BP:           129/66 mmHg Patient Gender: M                HR:           101 bpm. Exam Location:  Inpatient Procedure: Limited Echo, Cardiac Doppler, Color Doppler and Intracardiac            Opacification Agent (Both Spectral and Color Flow  Doppler were            utilized during procedure). Indications:    Acute Respiratory Distress  History:        Patient has prior history of Echocardiogram examinations, most                 recent 07/01/2023.  Sonographer:    Karma Ganja Referring Phys: [id number] RUTWIJ JOSHI  Sonographer Comments: Technically difficult study due to poor echo windows. IMPRESSIONS  1. Left ventricular ejection fraction, by estimation, is 40 to 45%. The left ventricle has mildly decreased function. Left ventricular diastolic parameters are indeterminate. Abnormal wall motion adjacent to RV pacing lead.  2. Right ventricular systolic function is moderately reduced. The right ventricular size is moderately enlarged. There is normal pulmonary artery systolic pressure.  3. Left atrial size was severely dilated.  4. Right atrial size was severely dilated.  5. Large pleural effusion in the left lateral region.  6. The mitral valve is grossly normal. No evidence of mitral valve regurgitation. No evidence of mitral stenosis.  7. Tricuspid valve regurgitation is mild to moderate.  8. The aortic valve has been repaired/replaced. Aortic valve regurgitation is not visualized. Echo findings are consistent with normal structure and function of the aortic valve prosthesis. Aortic valve mean gradient measures 5.0 mmHg. Comparison(s): Prior images reviewed side by side. Pleural effusion is new. Study not optimized to assess for pericardial constriction; if concern for this CMR or heart cathterization can be considered. FINDINGS  Left Ventricle: Respirophasic variation in the mid septum. LV tissue Doppler imaging not performed. Left ventricular ejection fraction, by estimation, is 40 to 45%. The left ventricle has mildly decreased function. Definity contrast agent was given IV to delineate the left ventricular endocardial borders. The left ventricular internal cavity size was normal in size. There is no left ventricular hypertrophy. Left ventricular  diastolic parameters are indeterminate.  LV Wall Scoring: The mid inferoseptal segment, apical septal segment, and apex are dyskinetic. Abnormal wall motion adjacent to RV pacing lead. Right Ventricle: The right ventricular size is moderately enlarged. Right ventricular systolic function is moderately reduced. There is normal pulmonary artery systolic pressure. The tricuspid regurgitant velocity is 2.84 m/s, and with an assumed right atrial pressure of 3 mmHg, the estimated right ventricular systolic pressure is 35.3 mmHg. Left Atrium: Left atrial size was severely dilated. Right Atrium: Right atrial size was severely dilated. Pericardium: Presence of epicardial fat layer. Mitral Valve: The mitral valve is grossly normal. No evidence of mitral valve stenosis. Tricuspid Valve: The tricuspid valve is normal in structure. Tricuspid valve regurgitation is mild to moderate. No evidence of tricuspid stenosis. Aortic Valve: The aortic valve has been repaired/replaced. Aortic valve regurgitation is not visualized. Aortic valve mean gradient measures 5.0 mmHg. Aortic valve peak gradient measures 8.2 mmHg. Aortic valve area, by VTI measures 2.69 cm. There is a 29 mm Sapien prosthetic, stented (TAVR) valve present in the aortic position. Echo findings are consistent with normal structure and function of the aortic valve prosthesis. Aorta: The aortic root and ascending aorta are structurally normal, with no evidence of dilitation. Additional Comments: A is visualized in the right ventricle and right atrium. There is a large pleural effusion in the left lateral region. Spectral Doppler performed. Color Doppler performed.  LEFT VENTRICLE PLAX 2D LVIDd:         4.40 cm LVIDs:         3.30 cm LV PW:         1.00 cm LV IVS:        1.00 cm LVOT diam:     2.70 cm LV SV:         62 LV SV Index:   28 LVOT Area:     5.73 cm  LV Volumes (MOD) LV vol d, MOD A2C: 111.0 ml LV vol d, MOD A4C: 69.5 ml LV vol s, MOD A2C: 35.2 ml LV vol s, MOD  A4C: 41.4 ml LV SV MOD A2C:     75.8 ml LV SV MOD A4C:     69.5 ml LV SV MOD BP:      52.5 ml RIGHT VENTRICLE  IVC RV S prime:     5.33 cm/s  IVC diam: 2.60 cm LEFT ATRIUM            Index LA diam:      4.80 cm  2.19 cm/m LA Vol (A4C): 173.0 ml 78.78 ml/m  AORTIC VALVE AV Area (Vmax):    2.40 cm AV Area (Vmean):   2.23 cm AV Area (VTI):     2.69 cm AV Vmax:           143.00 cm/s AV Vmean:          97.700 cm/s AV VTI:            0.232 m AV Peak Grad:      8.2 mmHg AV Mean Grad:      5.0 mmHg LVOT Vmax:         60.00 cm/s LVOT Vmean:        38.100 cm/s LVOT VTI:          0.109 m LVOT/AV VTI ratio: 0.47  AORTA Ao Root diam: 3.70 cm TRICUSPID VALVE TR Peak grad:   32.3 mmHg TR Vmax:        284.00 cm/s  SHUNTS Systemic VTI:  0.11 m Systemic Diam: 2.70 cm Riley Lam MD Electronically signed by Riley Lam MD Signature Date/Time: 11/18/2023/2:34:51 PM    Final    DG Chest Port 1 View Result Date: 11/17/2023 CLINICAL DATA:  Cough.  Shortness of breath. EXAM: PORTABLE CHEST 1 VIEW COMPARISON:  10/21/2023. FINDINGS: Low lung volume. There are asymmetric moderately increased interstitial markings in the right lung when compared to the left lung. Note is made of left retrocardiac airspace opacity obscuring the left hemidiaphragm, descending thoracic aorta and blunting the left lateral costophrenic angle, suggesting combination of left lung atelectasis and/or consolidation with pleural effusion. There is subtle blunting of right lateral costophrenic angle, concerning for trace right pleural effusion. No pneumothorax on either side Moderately enlarged cardio-mediastinal silhouette, which is likely accentuated by low lung volume and AP technique. Prosthetic aortic valve noted. There are surgical staples along the heart border and sternotomy wires, status post CABG (coronary artery bypass graft). There is a left sided 3-lead pacemaker. No acute osseous abnormalities. The soft tissues are within  normal limits. IMPRESSION: 1. Asymmetric moderately increased interstitial markings in the right lung when compared to the left lung, which may represent asymmetric pulmonary edema versus atypical pneumonia. 2. Left retrocardiac airspace opacity, which may represent combination of left lung atelectasis and/or consolidation with pleural effusion. 3. Trace right pleural effusion. Electronically Signed   By: Jules Schick M.D.   On: 11/17/2023 14:41    Labs: BNP (last 3 results) Recent Labs    11/17/23 1223 11/17/23 2333 11/22/23 0231  BNP 880.6* 752.7* 682.0*   Basic Metabolic Panel: Recent Labs  Lab 11/23/23 0244 11/24/23 0242 11/25/23 0728 11/26/23 0417 11/27/23 0410  NA 135 135 134* 134* 134*  K 3.9 3.5 4.0 3.8 3.8  CL 96* 97* 98 97* 96*  CO2 29 27 27 27 29   GLUCOSE 128* 124* 87 94 106*  BUN 51* 52* 41* 43* 46*  CREATININE 1.32* 1.24 1.13 1.11 1.32*  CALCIUM 8.0* 7.9* 7.8* 7.8* 7.7*  MG 2.7* 2.7* 2.7* 2.6* 2.5*   Liver Function Tests: Recent Labs  Lab 11/20/23 1317  AST 93*  ALT 70*  ALKPHOS 151*  BILITOT 0.8  PROT 6.5  ALBUMIN 2.1*   No results for input(s): "LIPASE", "AMYLASE" in the last 168 hours.  No results for input(s): "AMMONIA" in the last 168 hours. CBC: Recent Labs  Lab 11/23/23 0244 11/24/23 0242 11/25/23 0422 11/26/23 0417 11/27/23 0410  WBC 17.0* 21.7* 19.5* 19.1* 20.4*  NEUTROABS 15.2* 18.6* 16.4* 16.4* 16.9*  HGB 13.8 13.7 13.4 13.4 13.3  HCT 44.8 42.4 41.5 43.0 41.9  MCV 87.2 84.3 86.6 86.2 84.5  PLT 364 355 365 345 352  CBG: Recent Labs  Lab 11/26/23 0725 11/26/23 1209 11/26/23 1653 11/26/23 2149 11/27/23 0730  GLUCAP 77 96 185* 105* 88  Urinalysis    Component Value Date/Time   COLORURINE YELLOW 11/18/2023 0228   APPEARANCEUR CLEAR 11/18/2023 0228   LABSPEC 1.009 11/18/2023 0228   PHURINE 5.0 11/18/2023 0228   GLUCOSEU NEGATIVE 11/18/2023 0228   GLUCOSEU NEGATIVE 05/07/2023 1415   HGBUR NEGATIVE 11/18/2023 0228   HGBUR  negative 09/05/2010 0810   BILIRUBINUR NEGATIVE 11/18/2023 0228   BILIRUBINUR NEG 03/20/2022 1121   KETONESUR NEGATIVE 11/18/2023 0228   PROTEINUR NEGATIVE 11/18/2023 0228   UROBILINOGEN 0.2 05/07/2023 1415   NITRITE POSITIVE (A) 11/18/2023 0228   LEUKOCYTESUR NEGATIVE 11/18/2023 0228  Sepsis Labs Recent Labs  Lab 11/24/23 0242 11/25/23 0422 11/26/23 0417 11/27/23 0410  WBC 21.7* 19.5* 19.1* 20.4*   Microbiology Recent Results (from the past 240 hours)  Respiratory (~20 pathogens) panel by PCR     Status: Abnormal   Collection Time: 11/18/23  2:22 AM   Specimen: Nasopharyngeal Swab; Respiratory  Result Value Ref Range Status   Adenovirus NOT DETECTED NOT DETECTED Final   Coronavirus 229E NOT DETECTED NOT DETECTED Final    Comment: (NOTE) The Coronavirus on the Respiratory Panel, DOES NOT test for the novel  Coronavirus (2019 nCoV)    Coronavirus HKU1 NOT DETECTED NOT DETECTED Final   Coronavirus NL63 NOT DETECTED NOT DETECTED Final   Coronavirus OC43 DETECTED (A) NOT DETECTED Final   Metapneumovirus NOT DETECTED NOT DETECTED Final   Rhinovirus / Enterovirus NOT DETECTED NOT DETECTED Final   Influenza A NOT DETECTED NOT DETECTED Final   Influenza B NOT DETECTED NOT DETECTED Final   Parainfluenza Virus 1 NOT DETECTED NOT DETECTED Final   Parainfluenza Virus 2 NOT DETECTED NOT DETECTED Final   Parainfluenza Virus 3 NOT DETECTED NOT DETECTED Final   Parainfluenza Virus 4 NOT DETECTED NOT DETECTED Final   Respiratory Syncytial Virus NOT DETECTED NOT DETECTED Final   Bordetella pertussis NOT DETECTED NOT DETECTED Final   Bordetella Parapertussis NOT DETECTED NOT DETECTED Final   Chlamydophila pneumoniae NOT DETECTED NOT DETECTED Final   Mycoplasma pneumoniae NOT DETECTED NOT DETECTED Final    Comment: Performed at [facility], [address] [place], [place] [address]  Resp panel by RT-PCR (RSV, Flu A&B, Covid) Anterior Nasal Swab     Status: None   Collection Time:  11/18/23  2:22 AM   Specimen: Anterior Nasal Swab  Result Value Ref Range Status   SARS Coronavirus 2 by RT PCR NEGATIVE NEGATIVE Final    Comment: (NOTE) SARS-CoV-2 target nucleic acids are NOT DETECTED.  The SARS-CoV-2 RNA is generally detectable in upper respiratory specimens during the acute phase of infection. The lowest concentration of SARS-CoV-2 viral copies this assay can detect is 138 copies/mL. A negative result does not preclude SARS-Cov-2 infection and should not be used as the sole basis for treatment or other patient management decisions. A negative result may occur with  improper specimen collection/handling, submission of specimen other than nasopharyngeal swab, presence of viral mutation(s) within the areas  targeted by this assay, and inadequate number of viral copies(<138 copies/mL). A negative result must be combined with clinical observations, patient history, and epidemiological information. The expected result is Negative.  Fact Sheet for Patients:  BloggerCourse.com  Fact Sheet for Healthcare Providers:  SeriousBroker.it  This test is no t yet approved or cleared by the [place] FDA and  has been authorized for detection and/or diagnosis of SARS-CoV-2 by FDA under an Emergency Use Authorization (EUA). This EUA will remain  in effect (meaning this test can be used) for the duration of the COVID-19 declaration under Section 564(b)(1) of the Act, 21 U.S.C.section 360bbb-3(b)(1), unless the authorization is terminated  or revoked sooner.       Influenza A by PCR NEGATIVE NEGATIVE Final   Influenza B by PCR NEGATIVE NEGATIVE Final    Comment: (NOTE) The Xpert Xpress SARS-CoV-2/FLU/RSV plus assay is intended as an aid in the diagnosis of influenza from Nasopharyngeal swab specimens and should not be used as a sole basis for treatment. Nasal washings and aspirates are unacceptable for Xpert Xpress  SARS-CoV-2/FLU/RSV testing.  Fact Sheet for Patients: BloggerCourse.com  Fact Sheet for Healthcare Providers: SeriousBroker.it  This test is not yet approved or cleared by the [place] FDA and has been authorized for detection and/or diagnosis of SARS-CoV-2 by FDA under an Emergency Use Authorization (EUA). This EUA will remain in effect (meaning this test can be used) for the duration of the COVID-19 declaration under Section 564(b)(1) of the Act, 21 U.S.C. section 360bbb-3(b)(1), unless the authorization is terminated or revoked.     Resp Syncytial Virus by PCR NEGATIVE NEGATIVE Final    Comment: (NOTE) Fact Sheet for Patients: BloggerCourse.com  Fact Sheet for Healthcare Providers: SeriousBroker.it  This test is not yet approved or cleared by the [place] FDA and has been authorized for detection and/or diagnosis of SARS-CoV-2 by FDA under an Emergency Use Authorization (EUA). This EUA will remain in effect (meaning this test can be used) for the duration of the COVID-19 declaration under Section 564(b)(1) of the Act, 21 U.S.C. section 360bbb-3(b)(1), unless the authorization is terminated or revoked.  Performed at [facility], [address], [place], [place] [address]   Culture, blood (Routine X 2) w Reflex to ID Panel     Status: None   Collection Time: 11/18/23 10:12 AM   Specimen: BLOOD  Result Value Ref Range Status   Specimen Description   Final    BLOOD BLOOD RIGHT ARM AEROBIC BOTTLE ONLY ANAEROBIC BOTTLE ONLY Performed at [facility], [address], [place], [place] [address]    Special Requests   Final    BOTTLES DRAWN AEROBIC AND ANAEROBIC Blood Culture results may not be optimal due to an inadequate volume of blood received in culture bottles Performed at [facility], [address],  [place], [place] [address]    Culture   Final    NO GROWTH 5 DAYS Performed at [facility], [address], [place], [place] [address]    Report Status 11/23/2023 FINAL  Final  Culture, blood (Routine X 2) w Reflex to ID Panel     Status: None   Collection Time: 11/18/23 10:12 AM   Specimen: BLOOD  Result Value Ref Range Status   Specimen Description   Final    BLOOD BLOOD RIGHT HAND AEROBIC BOTTLE ONLY ANAEROBIC BOTTLE ONLY Performed at [facility], [address], [place], [place] [address]  Special Requests   Final    BOTTLES DRAWN AEROBIC AND ANAEROBIC Blood Culture results may not be optimal due to an inadequate volume of blood received in culture bottles Performed at [facility], [address] [place], [place] [address]    Culture   Final    NO GROWTH 5 DAYS Performed at [facility], [address], [place], [place] [address]    Report Status 11/23/2023 FINAL  Final  Expectorated Sputum Assessment w Gram Stain, Rflx to Resp Cult     Status: None   Collection Time: 11/18/23 12:30 PM   Specimen: Sputum  Result Value Ref Range Status   Specimen Description SPUTUM  Final   Special Requests Normal  Final   Sputum evaluation   Final    THIS SPECIMEN IS ACCEPTABLE FOR SPUTUM CULTURE Performed at [facility], [address] [place], [place] [address]    Report Status 11/18/2023 FINAL  Final  Culture, Respiratory w Gram Stain     Status: None   Collection Time: 11/18/23 12:30 PM   Specimen: SPU  Result Value Ref Range Status   Specimen Description   Final    SPUTUM Performed at [facility], [address] [place], [place] [address]    Special Requests   Final    Normal Reflexed from [id number] Performed at [facility], [address] [place], [place] [address]    Gram Stain NO WBC SEEN NO ORGANISMS SEEN   Final   Culture   Final    Normal respiratory flora-no  Staph aureus or Pseudomonas seen Performed at [facility], [address], [place], [place] [address]    Report Status 11/20/2023 FINAL  Final  Urine Culture (for pregnant, neutropenic or urologic patients or patients with an indwelling urinary catheter)     Status: Abnormal   Collection Time: 11/18/23 12:49 PM   Specimen: Urine, Clean Catch  Result Value Ref Range Status   Specimen Description   Final    URINE, CLEAN CATCH Performed at [facility], [address], [place], [place] [address]    Special Requests   Final    NONE Performed at [facility], [address], [place], [place] [address]    Culture (A)  Final    >=100,000 COLONIES/mL ESCHERICHIA COLI Confirmed Extended Spectrum Beta-Lactamase Producer (ESBL).  In bloodstream infections from ESBL organisms, carbapenems are preferred over piperacillin/tazobactam. They are shown to have a lower risk of mortality.    Report Status 11/20/2023 FINAL  Final   Organism ID, Bacteria ESCHERICHIA COLI (A)  Final      Susceptibility   Escherichia coli - MIC*    AMPICILLIN >=32 RESISTANT Resistant     CEFAZOLIN >=64 RESISTANT Resistant     CEFEPIME 16 RESISTANT Resistant     CEFTRIAXONE >=64 RESISTANT Resistant     CIPROFLOXACIN >=4 RESISTANT Resistant     GENTAMICIN >=16 RESISTANT Resistant     IMIPENEM <=0.25 SENSITIVE Sensitive     NITROFURANTOIN <=16 SENSITIVE Sensitive     TRIMETH/SULFA >=320 RESISTANT Resistant     AMPICILLIN/SULBACTAM >=32 RESISTANT Resistant     PIP/TAZO 64 INTERMEDIATE Intermediate ug/mL    * >=100,000 COLONIES/mL ESCHERICHIA COLI  Body fluid culture w Gram Stain     Status: None   Collection Time: 11/19/23  5:47 PM   Specimen: Pleural Fluid  Result Value Ref Range Status   Specimen Description  Final    PLEURAL LEFT LUNG Performed at [facility], [address] [place], [place] [address]    Special Requests   Final    NONE Performed  at [facility], [address], [place], [place] [address]    Gram Stain   Final    RARE WBC PRESENT, PREDOMINANTLY MONONUCLEAR NO ORGANISMS SEEN    Culture   Final    NO GROWTH 3 DAYS Performed at [facility], [address] [place], [place] [address]    Report Status 11/23/2023 FINAL  Final  MRSA Next Gen by PCR, Nasal     Status: None   Collection Time: 11/20/23 11:10 PM   Specimen: Nasal Mucosa; Nasal Swab  Result Value Ref Range Status   MRSA by PCR Next Gen NOT DETECTED NOT DETECTED Final    Comment: (NOTE) The GeneXpert MRSA Assay (FDA approved for NASAL specimens only), is one component of a comprehensive MRSA colonization surveillance program. It is not intended to diagnose MRSA infection nor to guide or monitor treatment for MRSA infections. Test performance is not FDA approved in patients less than 24 years old. Performed at [facility], [address], [place], [place] [address]    Time coordinating discharge: 35 minutes SIGNED: Lanae Boast, MD  [facility] 11/27/2023, 10:45 AM  If 7PM-7AM, please contact night-coverage [email or web address]

## 2023-11-27 NOTE — Care Management Important Message (Signed)
 Important Message  Patient Details IM Letter given. Name: ZACCAI CHAVARIN MRN: [id number] Date of Birth: [birth year]   Important Message Given:  Yes - Medicare IM     Caren Macadam 11/27/2023, 1:12 PM

## 2023-11-27 NOTE — Telephone Encounter (Signed)
 Returned call to daughter, Donye Campanelli.  She reports patient is returning to [facility] and his legs are swollen.  She wanted to check an remote transmission for fluid levels and assisted in sending report from patient's monitor.  11/27/2023 [employer] thoracic impedance suggesting normal fluid levels since 3/28.  Does not appear that he has fluid retention per [employer].  Pt has compression stockings for leg swelling if needed.  She appreciated the call back.

## 2023-11-27 NOTE — Plan of Care (Signed)

## 2023-11-27 NOTE — TOC Transition Note (Signed)
 Transition of Care [facility]) - Discharge Note   Patient Details  Name: Justin [name] MRN: [id number] Date of Birth: [birth year]  Transition of Care [facility]) CM/SW Contact:  Larrie Kass, LCSW Phone Number: 11/27/2023, 11:15 AM   Clinical Narrative:    Pt to d/c to SNF [facility] for SNF placement. Pt's room assignment [facility] , RN to call report to [phone] ex [phone]. CSW spoke with pt's daughter [name] who agrees with D/c plan . PTAR called , TOC sign off.    Final next level of care: Skilled Nursing Facility Barriers to Discharge: No Barriers Identified   Patient Goals and CMS Choice Patient states their goals for this hospitalization and ongoing recovery are:: SNF to get stonger CMS Medicare.gov Compare Post Acute Care list provided to::  (NA) Choice offered to / list presented to : NA [place] ownership interest in [facility].provided to::  (NA)    Discharge Placement                  Name of family member notified: [name], [name] (Daughter)  [phone] (Mobile) Patient and family notified of of transfer: 11/27/23  Discharge Plan and Services Additional resources added to the After Visit Summary for   In-house Referral: NA              DME Arranged: N/A DME Agency: NA       HH Arranged: NA HH Agency: NA        Social Drivers of Health (SDOH) Interventions SDOH Screenings   Food Insecurity: No Food Insecurity (11/18/2023)  Housing: Low Risk  (11/18/2023)  Transportation Needs: No Transportation Needs (11/18/2023)  Utilities: Not At Risk (11/18/2023)  Alcohol Screen: Low Risk  (03/31/2023)  Depression (PHQ2-9): High Risk (11/13/2023)  Financial Resource Strain: Low Risk  (03/31/2023)  Physical Activity: Sufficiently Active (03/31/2023)  Social Connections: Socially Integrated (11/18/2023)  Stress: No Stress Concern Present (03/31/2023)  Tobacco Use: Medium Risk (11/17/2023)  Health Literacy: Adequate Health Literacy (03/31/2023)      Readmission Risk Interventions    11/19/2023    1:00 PM  Readmission Risk Prevention Plan  Transportation Screening Complete  PCP or Specialist Appt within 3-5 Days Complete  HRI or Home Care Consult Complete  Social Work Consult for Recovery Care Planning/Counseling Complete  Palliative Care Screening Not Applicable  Medication Review Oceanographer) Complete

## 2023-11-28 ENCOUNTER — Encounter: Payer: Self-pay | Admitting: Sports Medicine

## 2023-11-28 ENCOUNTER — Non-Acute Institutional Stay (SKILLED_NURSING_FACILITY): Payer: Self-pay | Admitting: Sports Medicine

## 2023-11-28 DIAGNOSIS — Z09 Encounter for follow-up examination after completed treatment for conditions other than malignant neoplasm: Secondary | ICD-10-CM

## 2023-11-28 DIAGNOSIS — K5900 Constipation, unspecified: Secondary | ICD-10-CM

## 2023-11-28 DIAGNOSIS — N1831 Chronic kidney disease, stage 3a: Secondary | ICD-10-CM

## 2023-11-28 DIAGNOSIS — E1169 Type 2 diabetes mellitus with other specified complication: Secondary | ICD-10-CM

## 2023-11-28 DIAGNOSIS — I480 Paroxysmal atrial fibrillation: Secondary | ICD-10-CM | POA: Diagnosis not present

## 2023-11-28 DIAGNOSIS — J9601 Acute respiratory failure with hypoxia: Secondary | ICD-10-CM

## 2023-11-28 DIAGNOSIS — I5022 Chronic systolic (congestive) heart failure: Secondary | ICD-10-CM | POA: Diagnosis not present

## 2023-11-28 DIAGNOSIS — F330 Major depressive disorder, recurrent, mild: Secondary | ICD-10-CM

## 2023-11-28 NOTE — Progress Notes (Signed)
 Provider:  Dr. Venita Sheffield Location:  [facility] Place of Service:   Skilled care    PCP: Mast, Man X, NP Patient Care Team: Mast, Man X, NP as PCP - General (Internal Medicine) Justin Hazel, MD as PCP - Cardiology (Cardiology) Duke Salvia, MD as PCP - Electrophysiology (Cardiology) Justin Field, MD as Consulting Physician (Urology) Justin [name], [place] [name] (Inactive) as Pharmacist (Pharmacist) Justin Berger, MD as Consulting Physician (Ophthalmology)  Extended Emergency Contact Information Primary Emergency Contact: Justin [name] Address: [address]          [place], [place] [address] Justin [name] Phone: [phone] Relation: Daughter Secondary Emergency Contact: Justin [name] Address: [address]          [place], [place] [address] Justin [name] Home Phone: [phone] Mobile Phone: [phone] Relation: Daughter  Goals of Care: Advanced Directive information    11/18/2023   11:56 PM  Advanced Directives  Does Patient Have a Medical Advance Directive? Yes  Type of Advance Directive Healthcare Power of Attorney  Does patient want to make changes to medical advance directive? No - Patient declined      No chief complaint on file.      History of Present Illness 89 yo male with PMH BPH, CAD s/p CABG, CHF, HTN, HLD, DMII, AS s/p TAVR, PAF, hypothyroidism is evaluated for admission to skilled care after his recent hospitalization for Hypoxic respiratory failure.  Patient seen and examined in his room.  His wife and daughter at bedside. Patient is able to speak in full sentences, does not appear to be in distress . He is on supplemental oxygen, 2 L on nasal cannula. Patient complains of exertional shortness of breath, orthopnea and has lower extremity swelling.  He did physical therapy this morning and was able to walk with the help of the walker to the bathroom this morning. Denies  chest pain, dizzy, lightheadedness, abdominal pain, nausea, vomiting, dysuria, hematuria. Patient complains of constipation and had a bowel movement 2 days ago.  He is passing gas and able to tolerate p.o. food with no problems.  Daughter reports that patient had 2 urinary infections in the past and stopped [place].  He is currently on NovoLog sliding scale.  He is on prednisone taper dose.   As per d/c summary   '' Discharge diagnoses:  Primary problem : acute hypoxic respiratory failure Coronavirus infection with  OC43 with pneumonia respiratory failure multifactorial- 2/2 coronavirus infection, pneumonia, pleural effusion, acute HFmrEF. Managed with multimodal approach with diuretics.Seen by cardiology GDMT adjusted. Seen by pulmonary, given steroids bronchodilators and antibiotics. S/p high-dose steroids x 5 days through 3/29> then on prednisone taper  40 mg daily x 5 days, 20 mg daily x 5 days, 10 mg for 5 days then stop.     Left pleural effusion: Thoracentesis w/ 1000 cc of bloody fluid removed exudative by LDH.Complete antibiotics.  ''    Past Medical History:  Diagnosis Date   Age-related macular degeneration, dry, both eyes    Allergic    "24/7; 365 days/year; I'm allergic to pollens, dust, all southern grasses/trees, mold, mildue, cats, dogs" (01/07/2017)   Anal fissure    Asthma    sees Dr. Kendrick Fries    Benign prostatic hypertrophy    (sees Dr. Chester Holstein   CAD (coronary artery disease)    a. s/p CABG 2007. b. Cath 08/2016 - 4/4 patent grafts.   Carotid bruit    carotid u/s 10/10: 0.39% bilaterally  Chronic combined systolic and diastolic CHF (congestive heart failure) (HCC)    Complication of anesthesia 1980s   "w/anal cyst OR, he gave me a saddle block then put a narcotic in spinal cord; had a severe reaction to that" (01/07/2017)   Congestive heart failure (CHF) [name])    ED (erectile dysfunction)    Family history of adverse reaction to anesthesia    "daughter wakes up  during OR" (01/07/2017)   GERD (gastroesophageal reflux disease)    Gout    HTN (hypertension)    Hx of colonic polyps    (sees Dr. Marina Goodell)   Hyperlipidemia    Hypothyroidism    Moderate to severe aortic stenosis    a. s/p TAVR 02/2017.   Myocardial infarction [facility]) ~ 2000   Obesity    Osteoarthritis    "was in my knees, hands" (01/07/2017 )   PAF (paroxysmal atrial fibrillation) (HCC)    a. documented post TAVR.   Precancerous skin lesion    (sees Dr. 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   29 mm Edwards Sapien 3 transcatheter heart valve placed via percutaneous right transfemoral approach   Past Surgical History:  Procedure Laterality Date   ANUS SURGERY     "opened it back up cause it wouldn't heal; wound up w/a fissure" (01/07/2017)   BIV ICD INSERTION CRT-D N/A 05/02/2017   Procedure: BIV ICD INSERTION CRT-D;  Surgeon: Duke Salvia, MD;  Location: [facility] INVASIVE CV LAB;  Service: Cardiovascular;  Laterality: N/A;   CARDIAC CATHETERIZATION  10/29/2005   CARDIAC CATHETERIZATION N/A 08/27/2016   Procedure: Right/Left Heart Cath and Coronary/Graft Angiography;  Surgeon: Justin Hazel, MD;  Location: [name] INVAS[facility] CV LAB;  Service: Cardiovascular;  Laterality: N/A;   CARDIOVERSION N/A 04/24/2021   Procedure: CARDIOVERSION;  Surgeon: Pricilla Riffle, MD;  Location: [name] ENDOSCOPY;  Service: Cardiovascular;  Laterality: N/A;   CARDIOVERSION N/A 11/08/2021   Procedure: CARDIOVERSION;  Surgeon: Chrystie Nose, MD;  Location: [facility] ENDOSCOPY;  Service: Cardiovascular;  Laterality: N/A;   CARDIOVERSION N/A 02/06/2022   Procedure: CARDIOVERSION;  Surgeon: Wendall Stade, MD;  Location: [facility] ENDOSCOPY;  Service: Cardiovascular;  Laterality: N/A;   CATARACT EXTRACTION W/ INTRAOCULAR LENS  IMPLANT, BILATERAL Bilateral    CATARACT EXTRACTION, BILATERAL  2012   COLONOSCOPY  06/30/2008   no repeats needed     COLONOSCOPY     had 3 or 4 in the past    CORONARY ARTERY BYPASS GRAFT  2007   "CABG X4"   CYST EXCISION PERINEAL  1980s   HAMMER TOE SURGERY Bilateral    ICD GENERATOR CHANGEOUT N/A 05/30/2023   Procedure: ICD GENERATOR CHANGEOUT;  Surgeon: Duke Salvia, MD;  Location: [facility] INVASIVE CV LAB;  Service: Cardiovascular;  Laterality: N/A;   JOINT REPLACEMENT     KNEE ARTHROPLASTY  07/30/2011   Procedure: COMPUTER ASSISTED TOTAL KNEE ARTHROPLASTY;  Surgeon: Cammy Copa;  Location: [facility] OR;  Service: Orthopedics;  Laterality: Left;  left total knee arthroplasty   MASTECTOMY SUBCUTANEOUS Bilateral    MULTIPLE TOOTH EXTRACTIONS     ORIF FINGER / THUMB FRACTURE Right ~ 1980   "repair of thumb injury"   PROSTATE BIOPSY     REPLACEMENT TOTAL KNEE BILATERAL Bilateral 2012   TEE WITHOUT CARDIOVERSION N/A 03/04/2017   Procedure: TRANSESOPHAGEAL ECHOCARDIOGRAM (TEE);  Surgeon: Justin Hazel, MD;  Location: [facility] OR;  Service: Open Heart Surgery;  Laterality: N/A;   TOTAL KNEE REVISION Right 05/18/2019   Procedure: RIGHT PATELLA REVISION/REMOVAL;  Surgeon: Cammy Copa, MD;  Location: [facility] OR;  Service: Orthopedics;  Laterality: Right;   TRANSCATHETER AORTIC VALVE REPLACEMENT, TRANSFEMORAL N/A 03/04/2017   Procedure: TRANSCATHETER AORTIC VALVE REPLACEMENT, TRANSFEMORAL;  Surgeon: Justin Hazel, MD;  Location: [facility] OR;  Service: Open Heart Surgery;  Laterality: N/A;    reports that he quit smoking about 62 years ago. His smoking use included cigarettes. He started smoking about 75 years ago. He has a 45.5 pack-year smoking history. He has never used smokeless tobacco. He reports that he does not currently use alcohol after a past usage of about 7.0 standard drinks of alcohol per week. He reports that he does not use drugs. Social History   Socioeconomic History   Marital status: Married    Spouse name: Not on file   Number of children: 3   Years of education: Not on file   Highest  education level: Not on file  Occupational History   Occupation: Product/process development scientist    Comment: builds malls  Tobacco Use   Smoking status: Former    Current packs/day: 0.00    Average packs/day: 3.5 packs/day for 13.0 years (45.5 ttl pk-yrs)    Types: Cigarettes    Start date: 56    Quit date: 1963    Years since quitting: 62.2   Smokeless tobacco: Never   Tobacco comments:    Former smoker 04/19/21  Vaping Use   Vaping status: Never Used  Substance and Sexual Activity   Alcohol use: Not Currently    Alcohol/week: 7.0 standard drinks of alcohol    Types: 7 Cans of beer per week    Comment: 1 beer daily after 5pm (04/19/21)   Drug use: No   Sexual activity: Never  Other Topics Concern   Not on file  Social History Narrative   FH of CAD, Male 1st degree relative less than age 30.   Social Drivers of Corporate investment banker Strain: Low Risk  (03/31/2023)   Overall Financial Resource Strain (CARDIA)    Difficulty of Paying Living Expenses: Not very hard  Food Insecurity: No Food Insecurity (11/18/2023)   Hunger Vital Sign    Worried About Running Out of Food in the Last Year: Never true    Ran Out of Food in the Last Year: Never true  Transportation Needs: No Transportation Needs (11/18/2023)   PRAPARE - Administrator, Civil Service (Medical): No    Lack of Transportation (Non-Medical): No  Physical Activity: Sufficiently Active (03/31/2023)   Exercise Vital Sign    Days of Exercise per Week: 7 days    Minutes of Exercise per Session: 30 min  Stress: No Stress Concern Present (03/31/2023)   [employer] of Occupational Health - Occupational Stress Questionnaire    Feeling of Stress : Not at all  Social Connections: Socially Integrated (11/18/2023)   Social Connection and Isolation Panel [NHANES]    Frequency of Communication with Friends and Family: Three times a week    Frequency of Social Gatherings with Friends and Family: Three times a week     Attends Religious Services: More than 4 times per year    Active Member of Clubs or Organizations: Yes    Attends Banker Meetings: More than 4 times per year    Marital Status: Married  Intimate Partner Violence: Not At Risk (11/17/2023)  Humiliation, Afraid, Rape, and Kick questionnaire    Fear of Current or Ex-Partner: No    Emotionally Abused: No    Physically Abused: No    Sexually Abused: No    Functional Status Survey:    Family History  Problem Relation Age of Onset   Heart attack Father 61   Allergic rhinitis Father    Asthma Father    Heart failure Mother 5   Uterine cancer Mother    Breast cancer Mother    Colon cancer Neg Hx    Esophageal cancer Neg Hx     Health Maintenance  Topic Date Due   FOOT EXAM  Never done   COVID-19 Vaccine (6 - 2024-25 season) 05/26/2025 (Originally 04/27/2023)   OPHTHALMOLOGY EXAM  01/07/2024   INFLUENZA VACCINE  03/26/2024   Medicare Annual Wellness (AWV)  03/30/2024   HEMOGLOBIN A1C  04/12/2024   DTaP/Tdap/Td (3 - Td or Tdap) 10/28/2030   Pneumonia Vaccine 68+ Years old  Completed   Zoster Vaccines- Shingrix  Completed   HPV VACCINES  Aged Out    Allergies  Allergen Reactions   Peanut-Containing Drug Products Anaphylaxis   Sulfonamide Derivatives Anaphylaxis   Amlodipine Swelling and Other (See Comments)    Swelling in ankles   Eliquis [Apixaban] Other (See Comments)    Back/hip pain   Lisinopril Cough   Xarelto [Rivaroxaban] Other (See Comments)    Back/hip pain    Outpatient Encounter Medications as of 11/28/2023  Medication Sig   albuterol (VENTOLIN HFA) 108 (90 Base) MCG/ACT inhaler Inhale 2 puffs into the lungs every 4 (four) hours as needed for wheezing or shortness of breath.   allopurinol (ZYLOPRIM) 100 MG tablet Take 1 tablet (100 mg total) by mouth daily.   amiodarone (PACERONE) 200 MG tablet Take 1 tablet (200 mg total) by mouth daily.   aspirin 81 MG tablet Take 1 tablet (81 mg total) by mouth  daily.   atorvastatin (LIPITOR) 20 MG tablet TAKE ONE TABLET ONCE DAILY   cetirizine (ZYRTEC) 10 MG tablet Take 10 mg by mouth 2 (two) times daily.   colchicine 0.6 MG tablet Take 1 tablet (0.6 mg total) by mouth every 6 (six) hours as needed (gout).   Continuous Glucose Receiver (DEXCOM G6 RECEIVER) DEVI 1 Device by Does not apply route 3 (three) times daily. E11.69   Continuous Glucose Sensor (DEXCOM G6 SENSOR) MISC 1 Device by Does not apply route 3 (three) times daily. E11.69   Continuous Glucose Transmitter (DEXCOM G6 TRANSMITTER) MISC 1 Device by Does not apply route 3 (three) times daily. E11.69   dabigatran (PRADAXA) 150 MG CAPS capsule TAKE (1) CAPSULE TWICE DAILY.   isosorbide mononitrate (IMDUR) 30 MG 24 hr tablet Take 1 tablet (30 mg total) by mouth daily.   ketotifen (ZADITOR) 0.025 % ophthalmic solution Place 1 drop into both eyes daily as needed (for itching).   levothyroxine (SYNTHROID) 175 MCG tablet Take 1 tablet (175 mcg total) by mouth daily before breakfast.   losartan (COZAAR) 25 MG tablet Take 0.5 tablets (12.5 mg total) by mouth daily.   magic mouthwash SOLN Take 5 mLs by mouth as directed. Suspension contains equal amounts of Maalox Extra Strength, nystatin, and diphenhydramine. RX'ed by dentist   methocarbamol (ROBAXIN) 500 MG tablet Take 1 tablet (500 mg total) by mouth 4 (four) times daily as needed for muscle spasms.   metoCLOPramide (REGLAN) 10 MG tablet TAKE ONE TABLET BY MOUTH EVERY MORNING WITH BREAKFAST   metolazone (ZAROXOLYN) 2.5  MG tablet Take 1 tablet (2.5 mg total) by mouth once a week every Saturday   metoprolol succinate (TOPROL-XL) 50 MG 24 hr tablet Take 1 tablet (50 mg total) by mouth daily. Take with or immediately following a meal.   montelukast (SINGULAIR) 10 MG tablet TAKE ONE TABLET BY MOUTH ONCE DAILY IN THE EVENING   Multiple Minerals-Vitamins (CAL MAG ZINC +D3 PO) Take 2 tablets by mouth in the morning. 500 mg / 7 mg / 25 mcg   Multiple  Vitamins-Minerals (MULTI-VITAMIN GUMMIES) CHEW Chew 2 tablets by mouth See admin instructions. Chew 2 gummies by mouth in the morning   Multiple Vitamins-Minerals (PRESERVISION AREDS 2) CAPS Take 1 capsule by mouth in the morning and at bedtime.   pantoprazole (PROTONIX) 40 MG tablet Take 1 tablet (40 mg total) by mouth daily.   potassium chloride SA (KLOR-CON M) 20 MEQ tablet Take 1 tablet (20 mEq total) by mouth 2 (two) times daily.   predniSONE (DELTASONE) 10 MG tablet Take 2 tabs daily x 5 days then 1 tab daily x 5 days then stop   silver sulfADIAZINE (SILVADENE) 1 % cream Apply 1 Application topically 2 (two) times daily.   spironolactone (ALDACTONE) 25 MG tablet Take 1 tablet (25 mg total) by mouth daily.   torsemide (DEMADEX) 20 MG tablet Take 1 tablet (20 mg total) by mouth daily.   triamcinolone cream (KENALOG) 0.1 % Apply 1 application  topically daily as needed for dry skin (affected areas).   valACYclovir (VALTREX) 500 MG tablet Take 500 mg by mouth 2 (two) times daily as needed (cold sores).   venlafaxine XR (EFFEXOR XR) 37.5 MG 24 hr capsule Take 1 capsule (37.5 mg total) by mouth daily with breakfast.   No facility-administered encounter medications on file as of 11/28/2023.    Review of Systems Negative unless indicated in HPI.  There were no vitals filed for this visit. There is no height or weight on file to calculate BMI. BP Readings from Last 3 Encounters:  11/27/23 119/75  11/13/23 90/60  11/12/23 112/72   Wt Readings from Last 3 Encounters:  11/21/23 242 lb 8.1 oz (110 kg)  11/13/23 250 lb 6.4 oz (113.6 kg)  11/12/23 249 lb (112.9 kg)   Physical Exam  Labs reviewed: Basic Metabolic Panel: Recent Labs    06/30/23 1616 07/01/23 0355 07/02/23 0415 11/25/23 0728 11/26/23 0417 11/27/23 0410  NA  --  140   < > 134* 134* 134*  K  --  4.1   < > 4.0 3.8 3.8  CL  --  106   < > 98 97* 96*  CO2  --  24   < > 27 27 29   GLUCOSE  --  125*   < > 87 94 106*  BUN  --   14   < > 41* 43* 46*  CREATININE  --  1.30*   < > 1.13 1.11 1.32*  CALCIUM  --  7.8*   < > 7.8* 7.8* 7.7*  MG 2.7*  --    < > 2.7* 2.6* 2.5*  PHOS 2.3* 3.3  --   --   --   --    < > = values in this interval not displayed.   Liver Function Tests: Recent Labs    11/17/23 1211 11/18/23 1918 11/19/23 1844 11/20/23 1317  AST 53* 76*  --  93*  ALT 38 45*  --  70*  ALKPHOS 135* 147*  --  151*  BILITOT  1.7* 0.8  --  0.8  PROT 6.9 6.7 7.3 6.5  ALBUMIN 2.2* 2.1*  --  2.1*   No results for input(s): "LIPASE", "AMYLASE" in the last 8760 hours. No results for input(s): "AMMONIA" in the last 8760 hours. CBC: Recent Labs    11/25/23 0422 11/26/23 0417 11/27/23 0410  WBC 19.5* 19.1* 20.4*  NEUTROABS 16.4* 16.4* 16.9*  HGB 13.4 13.4 13.3  HCT 41.5 43.0 41.9  MCV 86.6 86.2 84.5  PLT 365 345 352   Cardiac Enzymes: Recent Labs    11/22/23 0231  CKTOTAL 29*  CKMB 4.0   BNP: Invalid input(s): "POCBNP" Lab Results  Component Value Date   HGBA1C 6.8 (H) 10/14/2023   Lab Results  Component Value Date   TSH 0.169 (L) 11/18/2023   Lab Results  Component Value Date   VITAMINB12 503 10/14/2023   No results found for: "FOLATE" Lab Results  Component Value Date   IRON 151 06/30/2023   TIBC 400 06/30/2023   FERRITIN 28 06/30/2023    Imaging and Procedures obtained prior to SNF admission: DG CHEST PORT 1 VIEW Result Date: 11/18/2023 CLINICAL DATA:  Hypoxia EXAM: PORTABLE CHEST 1 VIEW COMPARISON:  11/17/2023 FINDINGS: Left-sided implanted cardiac device. Stable cardiomegaly status post sternotomy, CABG, and TAVR. Aortic atherosclerosis. Airspace opacity throughout the right lung is unchanged. Improving aeration of the left lung base compared to prior. No appreciable pleural fluid collection. No pneumothorax. IMPRESSION: Airspace opacity throughout the right lung is unchanged. Improving aeration of the left lung base compared to prior. Electronically Signed   By: Duanne Guess  D.O.   On: 11/18/2023 17:54   DG Abd 1 View Result Date: 11/18/2023 CLINICAL DATA:  Abdominal distension. EXAM: ABDOMEN - 1 VIEW COMPARISON:  Pelvic radiograph dated 02/27/2022. FINDINGS: Evaluation is limited due to body habitus. Moderate stool and air throughout the colon. No bowel dilatation or evidence of obstruction. Degenerative changes of the spine. No acute osseous pathology. IMPRESSION: Nonobstructive bowel gas pattern. Electronically Signed   By: Elgie Collard M.D.   On: 11/18/2023 15:14   ECHOCARDIOGRAM LIMITED Result Date: 11/18/2023    ECHOCARDIOGRAM LIMITED REPORT   Patient Name:   Justin [name] Date of Exam: 11/18/2023 Medical Rec #:  [id number]        Height:       66.5 in Accession #:    [id number]       Weight:       246.0 lb Date of Birth:  1934-01-05       BSA:          2.196 m Patient Age:    89 years         BP:           129/66 mmHg Patient Gender: M                HR:           101 bpm. Exam Location:  Inpatient Procedure: Limited Echo, Cardiac Doppler, Color Doppler and Intracardiac            Opacification Agent (Both Spectral and Color Flow Doppler were            utilized during procedure). Indications:    Acute Respiratory Distress  History:        Patient has prior history of Echocardiogram examinations, most                 recent 07/01/2023.  Sonographer:    Casimiro Needle  [facility] Referring Phys: [id number] RUTWIJ JOSHI  Sonographer Comments: Technically difficult study due to poor echo windows. IMPRESSIONS  1. Left ventricular ejection fraction, by estimation, is 40 to 45%. The left ventricle has mildly decreased function. Left ventricular diastolic parameters are indeterminate. Abnormal wall motion adjacent to RV pacing lead.  2. Right ventricular systolic function is moderately reduced. The right ventricular size is moderately enlarged. There is normal pulmonary artery systolic pressure.  3. Left atrial size was severely dilated.  4. Right atrial size was severely dilated.  5. Large  pleural effusion in the left lateral region.  6. The mitral valve is grossly normal. No evidence of mitral valve regurgitation. No evidence of mitral stenosis.  7. Tricuspid valve regurgitation is mild to moderate.  8. The aortic valve has been repaired/replaced. Aortic valve regurgitation is not visualized. Echo findings are consistent with normal structure and function of the aortic valve prosthesis. Aortic valve mean gradient measures 5.0 mmHg. Comparison(s): Prior images reviewed side by side. Pleural effusion is new. Study not optimized to assess for pericardial constriction; if concern for this CMR or heart cathterization can be considered. FINDINGS  Left Ventricle: Respirophasic variation in the mid septum. LV tissue Doppler imaging not performed. Left ventricular ejection fraction, by estimation, is 40 to 45%. The left ventricle has mildly decreased function. Definity contrast agent was given IV to delineate the left ventricular endocardial borders. The left ventricular internal cavity size was normal in size. There is no left ventricular hypertrophy. Left ventricular diastolic parameters are indeterminate.  LV Wall Scoring: The mid inferoseptal segment, apical septal segment, and apex are dyskinetic. Abnormal wall motion adjacent to RV pacing lead. Right Ventricle: The right ventricular size is moderately enlarged. Right ventricular systolic function is moderately reduced. There is normal pulmonary artery systolic pressure. The tricuspid regurgitant velocity is 2.84 m/s, and with an assumed right atrial pressure of 3 mmHg, the estimated right ventricular systolic pressure is 35.3 mmHg. Left Atrium: Left atrial size was severely dilated. Right Atrium: Right atrial size was severely dilated. Pericardium: Presence of epicardial fat layer. Mitral Valve: The mitral valve is grossly normal. No evidence of mitral valve stenosis. Tricuspid Valve: The tricuspid valve is normal in structure. Tricuspid valve  regurgitation is mild to moderate. No evidence of tricuspid stenosis. Aortic Valve: The aortic valve has been repaired/replaced. Aortic valve regurgitation is not visualized. Aortic valve mean gradient measures 5.0 mmHg. Aortic valve peak gradient measures 8.2 mmHg. Aortic valve area, by VTI measures 2.69 cm. There is a 29 mm Sapien prosthetic, stented (TAVR) valve present in the aortic position. Echo findings are consistent with normal structure and function of the aortic valve prosthesis. Aorta: The aortic root and ascending aorta are structurally normal, with no evidence of dilitation. Additional Comments: A is visualized in the right ventricle and right atrium. There is a large pleural effusion in the left lateral region. Spectral Doppler performed. Color Doppler performed.  LEFT VENTRICLE PLAX 2D LVIDd:         4.40 cm LVIDs:         3.30 cm LV PW:         1.00 cm LV IVS:        1.00 cm LVOT diam:     2.70 cm LV SV:         62 LV SV Index:   28 LVOT Area:     5.73 cm  LV Volumes (MOD) LV vol d, MOD A2C: 111.0 ml LV vol d, MOD A4C:  69.5 ml LV vol s, MOD A2C: 35.2 ml LV vol s, MOD A4C: 41.4 ml LV SV MOD A2C:     75.8 ml LV SV MOD A4C:     69.5 ml LV SV MOD BP:      52.5 ml RIGHT VENTRICLE            IVC RV S prime:     5.33 cm/s  IVC diam: 2.60 cm LEFT ATRIUM            Index LA diam:      4.80 cm  2.19 cm/m LA Vol (A4C): 173.0 ml 78.78 ml/m  AORTIC VALVE AV Area (Vmax):    2.40 cm AV Area (Vmean):   2.23 cm AV Area (VTI):     2.69 cm AV Vmax:           143.00 cm/s AV Vmean:          97.700 cm/s AV VTI:            0.232 m AV Peak Grad:      8.2 mmHg AV Mean Grad:      5.0 mmHg LVOT Vmax:         60.00 cm/s LVOT Vmean:        38.100 cm/s LVOT VTI:          0.109 m LVOT/AV VTI ratio: 0.47  AORTA Ao Root diam: 3.70 cm TRICUSPID VALVE TR Peak grad:   32.3 mmHg TR Vmax:        284.00 cm/s  SHUNTS Systemic VTI:  0.11 m Systemic Diam: 2.70 cm Riley Lam MD Electronically signed by Riley Lam  MD Signature Date/Time: 11/18/2023/2:34:51 PM    Final    DG Chest Port 1 View Result Date: 11/17/2023 CLINICAL DATA:  Cough.  Shortness of breath. EXAM: PORTABLE CHEST 1 VIEW COMPARISON:  10/21/2023. FINDINGS: Low lung volume. There are asymmetric moderately increased interstitial markings in the right lung when compared to the left lung. Note is made of left retrocardiac airspace opacity obscuring the left hemidiaphragm, descending thoracic aorta and blunting the left lateral costophrenic angle, suggesting combination of left lung atelectasis and/or consolidation with pleural effusion. There is subtle blunting of right lateral costophrenic angle, concerning for trace right pleural effusion. No pneumothorax on either side Moderately enlarged cardio-mediastinal silhouette, which is likely accentuated by low lung volume and AP technique. Prosthetic aortic valve noted. There are surgical staples along the heart border and sternotomy wires, status post CABG (coronary artery bypass graft). There is a left sided 3-lead pacemaker. No acute osseous abnormalities. The soft tissues are within normal limits. IMPRESSION: 1. Asymmetric moderately increased interstitial markings in the right lung when compared to the left lung, which may represent asymmetric pulmonary edema versus atypical pneumonia. 2. Left retrocardiac airspace opacity, which may represent combination of left lung atelectasis and/or consolidation with pleural effusion. 3. Trace right pleural effusion. Electronically Signed   By: Jules Schick M.D.   On: 11/17/2023 14:41    Assessment and Plan Assessment & Plan  Hypoxic respiratory failure Left sided pleural effusion Patient does not appear to be in distress. Afebrile Lungs clear on auscultation  continue with supplemental oxygen Continue with prednisone taper Monitor blood sugars  Congestive heart failure Lungs clear Has chronic lower extremity swelling Continue with torsemide Instructed  patient to elevate his feet and use compression stockings   Diabetes mellitus Off Farxiga due to recurrent UTI Continue with a sliding scale insulin Monitor blood sugars  History of A-fib Rate controlled Continue with amiodarone, metoprolol Continue with Pradaxa Monitor for signs of bleeding   CKD stage III Will check labs next week   Constipation Will start Colace 100 mg twice a day Start MiraLAX as needed   Depression Continue with Effexor   35 min Total time spent for obtaining history,  performing a medically appropriate examination and evaluation, reviewing the tests,ordering  tests,  documenting clinical information in the electronic or other health record ,care coordination (not separately reported)

## 2023-11-29 LAB — CUP PACEART REMOTE DEVICE CHECK
Battery Remaining Longevity: 85 mo
Battery Voltage: 2.98 V
Brady Statistic RA Percent Paced: 0.06 %
Brady Statistic RV Percent Paced: 74.62 %
Date Time Interrogation Session: 20250403153750
HighPow Impedance: 87 Ohm
Implantable Lead Connection Status: 753985
Implantable Lead Connection Status: 753985
Implantable Lead Connection Status: 753985
Implantable Lead Implant Date: 20180907
Implantable Lead Implant Date: 20180907
Implantable Lead Implant Date: 20180907
Implantable Lead Location: 753858
Implantable Lead Location: 753859
Implantable Lead Location: 753860
Implantable Lead Model: 4396
Implantable Lead Model: 5076
Implantable Pulse Generator Implant Date: 20241004
Lead Channel Impedance Value: 1292 Ohm
Lead Channel Impedance Value: 285 Ohm
Lead Channel Impedance Value: 323 Ohm
Lead Channel Impedance Value: 342 Ohm
Lead Channel Impedance Value: 570 Ohm
Lead Channel Impedance Value: 760 Ohm
Lead Channel Pacing Threshold Amplitude: 1.125 V
Lead Channel Pacing Threshold Amplitude: 2.25 V
Lead Channel Pacing Threshold Pulse Width: 0.4 ms
Lead Channel Pacing Threshold Pulse Width: 0.4 ms
Lead Channel Sensing Intrinsic Amplitude: 0.9 mV
Lead Channel Sensing Intrinsic Amplitude: 10.8 mV
Lead Channel Setting Pacing Amplitude: 1.75 V
Lead Channel Setting Pacing Amplitude: 2 V
Lead Channel Setting Pacing Amplitude: 3 V
Lead Channel Setting Pacing Pulse Width: 0.4 ms
Lead Channel Setting Pacing Pulse Width: 0.4 ms
Lead Channel Setting Sensing Sensitivity: 0.3 mV
Zone Setting Status: 755011
Zone Setting Status: 755011

## 2023-12-02 ENCOUNTER — Ambulatory Visit (INDEPENDENT_AMBULATORY_CARE_PROVIDER_SITE_OTHER): Payer: Medicare Other

## 2023-12-02 DIAGNOSIS — I428 Other cardiomyopathies: Secondary | ICD-10-CM

## 2023-12-04 ENCOUNTER — Encounter: Admitting: Nurse Practitioner

## 2023-12-06 ENCOUNTER — Encounter: Payer: Self-pay | Admitting: Internal Medicine

## 2023-12-08 NOTE — Progress Notes (Signed)
 This encounter was created in error - please disregard.

## 2023-12-15 ENCOUNTER — Ambulatory Visit: Attending: Internal Medicine

## 2023-12-15 ENCOUNTER — Encounter: Payer: Self-pay | Admitting: Nurse Practitioner

## 2023-12-15 ENCOUNTER — Non-Acute Institutional Stay (SKILLED_NURSING_FACILITY): Payer: Self-pay | Admitting: Nurse Practitioner

## 2023-12-15 DIAGNOSIS — I480 Paroxysmal atrial fibrillation: Secondary | ICD-10-CM | POA: Diagnosis not present

## 2023-12-15 DIAGNOSIS — Z950 Presence of cardiac pacemaker: Secondary | ICD-10-CM | POA: Diagnosis not present

## 2023-12-15 DIAGNOSIS — N1831 Chronic kidney disease, stage 3a: Secondary | ICD-10-CM | POA: Diagnosis not present

## 2023-12-15 DIAGNOSIS — F3341 Major depressive disorder, recurrent, in partial remission: Secondary | ICD-10-CM

## 2023-12-15 DIAGNOSIS — I5042 Chronic combined systolic (congestive) and diastolic (congestive) heart failure: Secondary | ICD-10-CM

## 2023-12-15 DIAGNOSIS — I2510 Atherosclerotic heart disease of native coronary artery without angina pectoris: Secondary | ICD-10-CM | POA: Diagnosis not present

## 2023-12-15 DIAGNOSIS — M109 Gout, unspecified: Secondary | ICD-10-CM

## 2023-12-15 DIAGNOSIS — E039 Hypothyroidism, unspecified: Secondary | ICD-10-CM

## 2023-12-15 DIAGNOSIS — B379 Candidiasis, unspecified: Secondary | ICD-10-CM | POA: Insufficient documentation

## 2023-12-15 DIAGNOSIS — J90 Pleural effusion, not elsewhere classified: Secondary | ICD-10-CM

## 2023-12-15 DIAGNOSIS — Z95810 Presence of automatic (implantable) cardiac defibrillator: Secondary | ICD-10-CM

## 2023-12-15 DIAGNOSIS — K219 Gastro-esophageal reflux disease without esophagitis: Secondary | ICD-10-CM | POA: Diagnosis not present

## 2023-12-15 DIAGNOSIS — R0689 Other abnormalities of breathing: Secondary | ICD-10-CM | POA: Diagnosis not present

## 2023-12-15 DIAGNOSIS — I509 Heart failure, unspecified: Secondary | ICD-10-CM

## 2023-12-15 DIAGNOSIS — D72829 Elevated white blood cell count, unspecified: Secondary | ICD-10-CM

## 2023-12-15 DIAGNOSIS — I10 Essential (primary) hypertension: Secondary | ICD-10-CM

## 2023-12-15 DIAGNOSIS — E785 Hyperlipidemia, unspecified: Secondary | ICD-10-CM

## 2023-12-15 DIAGNOSIS — E1169 Type 2 diabetes mellitus with other specified complication: Secondary | ICD-10-CM

## 2023-12-15 DIAGNOSIS — I517 Cardiomegaly: Secondary | ICD-10-CM | POA: Diagnosis not present

## 2023-12-15 NOTE — Assessment & Plan Note (Signed)
 Hx of CABF, TAVR, no chest pain, taking Isosorbide

## 2023-12-15 NOTE — Assessment & Plan Note (Signed)
 on ASA, Atorvastatin, LDL 55 10/14/23

## 2023-12-15 NOTE — Assessment & Plan Note (Addendum)
 mild edema BLE, RLE>LLE, off Farxiga  due to recurrent UTI, on Metoprolol , Metolazone , Spironolactone , Torsemide  Repeat UA C/S to r/o UTI

## 2023-12-15 NOTE — Assessment & Plan Note (Signed)
 redness, irritation in groin areas and started Nystatin powder bid since 12/13/23 with little efficacy. Adding 0.1% Triamcinolone  cream bid to affected area until healed.

## 2023-12-15 NOTE — Assessment & Plan Note (Addendum)
 Bun/creat 46/1.32 11/27/23 Update CMP/eGFR

## 2023-12-15 NOTE — Assessment & Plan Note (Signed)
 on Levothyroxine, TSH 3.27 10/14/23

## 2023-12-15 NOTE — Assessment & Plan Note (Signed)
 no flare up over a year, R+L wrist, only takes Allopurinol, uric acid 7.6 10/10/22

## 2023-12-15 NOTE — Assessment & Plan Note (Signed)
 s/p pace maker, defibrillator , on Amiodarone, Pradaxa, Metoprolol, followed by cardiology.

## 2023-12-15 NOTE — Assessment & Plan Note (Signed)
 wbc 20.4 11/27/23, repeat CBC/diff.

## 2023-12-15 NOTE — Progress Notes (Addendum)
 Location:   S[facility] Room Number: 66B Place of Service:  SNF (31) Provider: Abner Hoffman Nechama Escutia NP  Vernia Teem X, NP  Patient Care Team: Lorana Maffeo X, NP as PCP - General (Internal Medicine) Odie Benne, MD as PCP - Cardiology (Cardiology) Verona Goodwill, MD as PCP - Electrophysiology (Cardiology) Christina Coyer, MD as Consulting Physician (Urology) Alver Austin, [facility] (Inactive) as Pharmacist (Pharmacist) Dema Filler, MD as Consulting Physician (Ophthalmology)  Extended Emergency Contact Information Primary Emergency Contact: [facility] Address: [address]          [place], [place] [address] [place] Phone: [phone] Relation: Daughter Secondary Emergency Contact: Bluford Burkitt Address: [address]          [place], [place] [address] [place] Home Phone: [phone] Mobile Phone: [phone] Relation: Daughter  Code Status: DNR Goals of care: Advanced Directive information    11/28/2023    2:14 PM  Advanced Directives  Does Patient Have a Medical Advance Directive? Yes  Type of Estate agent of [place];Living will;Out of facility DNR (pink MOST or yellow form)  Does patient want to make changes to medical advance directive? No - Patient declined  Copy of Healthcare Power of Attorney in Chart? Yes - validated most recent copy scanned in chart (See row information)     Chief Complaint  Patient presents with   Acute Visit    Rash in groin areas.     HPI:  Pt is a 89 y.o. male seen today for an acute visit for redness, irritation in groin areas and started Nystatin powder bid since 12/13/23 with little efficacy.     Hospitalized for hypoxic respiratory failure COVID/PNA and acute HRmrEF, left pleural effusion-s/p thoracentesis with 1000cc of bloody fluid removed exudative by LDH,  improved SOB, Orthopnea, and edema BLE, treated with Prednisone , bronchodilators,  antibiotics, and diuretics. Underwent evaluation of pulmonology, cardiology.   Leukocytosis, wbc 20.4 11/27/23             Gout, no flare up over a year, R+L wrist, only takes Allopurinol , uric acid 7.6 10/10/22             Afib, s/p pace maker, defibrillator , on Amiodarone , Pradaxa , Metoprolol , followed by cardiology.              HLD on ASA, Atorvastatin , LDL 55 10/14/23             CAD, Hx of CABF, TAVR, no chest pain, taking Isosorbide              CHF, mild edema BLE, RLE>LLE, off Farxiga  due to recurrent UTI, on Metoprolol , Metolazone , Spironolactone , Torsemide              Hypothyroidism, on Levothyroxine , TSH 3.27 10/14/23             HTN, blood pressure is controlled, on Losartan , Metoprolol , diuretics.              GERD, stable, on Omeprazole , Hx of Reglan  use             Depression, better with Effexor , failed Zoloft , feels sleeps too much and depressed.              CKD Bun/creat 46/1.32 11/27/23             T2DM, Hgb A1c 6.8 10/14/23, on insulin         Past Medical History:  Diagnosis Date   Age-related macular degeneration, dry, both  eyes    Allergic    "24/7; 365 days/year; I'm allergic to pollens, dust, all southern grasses/trees, mold, mildue, cats, dogs" (01/07/2017)   Anal fissure    Asthma    sees Dr. Elverna Hamman    Benign prostatic hypertrophy    (sees Dr. Verdel Gitelman   CAD (coronary artery disease)    a. s/p CABG 2007. b. Cath 08/2016 - 4/4 patent grafts.   Carotid bruit    carotid u/s 10/10: 0.39% bilaterally   Chronic combined systolic and diastolic CHF (congestive heart failure) (HCC)    Complication of anesthesia 1980s   "w/anal cyst OR, he gave me a saddle block then put a narcotic in spinal cord; had a severe reaction to that" (01/07/2017)   Congestive heart failure (CHF) [facility])    ED (erectile dysfunction)    Family history of adverse reaction to anesthesia    "daughter wakes up during OR" (01/07/2017)   GERD (gastroesophageal reflux disease)    Gout    HTN  (hypertension)    Hx of colonic polyps    (sees Dr. Elvin Hammer)   Hyperlipidemia    Hypothyroidism    Moderate to severe aortic stenosis    a. s/p TAVR 02/2017.   Myocardial infarction [facility]) ~ 2000   Obesity    Osteoarthritis    "was in my knees, hands" (01/07/2017 )   PAF (paroxysmal atrial fibrillation) (HCC)    a. documented post TAVR.   Precancerous skin lesion    (sees Dr. 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   29 mm Edwards Sapien 3 transcatheter heart valve placed via percutaneous right transfemoral approach   Past Surgical History:  Procedure Laterality Date   ANUS SURGERY     "opened it back up cause it wouldn't heal; wound up w/a fissure" (01/07/2017)   BIV ICD INSERTION CRT-D N/A 05/02/2017   Procedure: BIV ICD INSERTION CRT-D;  Surgeon: Verona Goodwill, MD;  Location: [facility] INVASIVE CV LAB;  Service: Cardiovascular;  Laterality: N/A;   CARDIAC CATHETERIZATION  10/29/2005   CARDIAC CATHETERIZATION N/A 08/27/2016   Procedure: Right/Left Heart Cath and Coronary/Graft Angiography;  Surgeon: Odie Benne, MD;  Location: [facility] INVASIVE CV LAB;  Service: Cardiovascular;  Laterality: N/A;   CARDIOVERSION N/A 04/24/2021   Procedure: CARDIOVERSION;  Surgeon: Elmyra Haggard, MD;  Location: [facility] ENDOSCOPY;  Service: Cardiovascular;  Laterality: N/A;   CARDIOVERSION N/A 11/08/2021   Procedure: CARDIOVERSION;  Surgeon: Hazle Lites, MD;  Location: [facility] ENDOSCOPY;  Service: Cardiovascular;  Laterality: N/A;   CARDIOVERSION N/A 02/06/2022   Procedure: CARDIOVERSION;  Surgeon: Loyde Rule, MD;  Location: [facility] ENDOSCOPY;  Service: Cardiovascular;  Laterality: N/A;   CATARACT EXTRACTION W/ INTRAOCULAR LENS  IMPLANT, BILATERAL Bilateral    CATARACT EXTRACTION, BILATERAL  2012   COLONOSCOPY  06/30/2008   no repeats needed    COLONOSCOPY     had 3 or 4 in the past    CORONARY ARTERY BYPASS GRAFT  2007    "CABG X4"   CYST EXCISION PERINEAL  1980s   HAMMER TOE SURGERY Bilateral    ICD GENERATOR CHANGEOUT N/A 05/30/2023   Procedure: ICD GENERATOR CHANGEOUT;  Surgeon: Verona Goodwill, MD;  Location: [facility] INVASIVE CV LAB;  Service: Cardiovascular;  Laterality: N/A;   JOINT REPLACEMENT     KNEE ARTHROPLASTY  07/30/2011   Procedure: COMPUTER ASSISTED TOTAL KNEE ARTHROPLASTY;  Surgeon:  Jasmine Mesi;  Location: [facility] OR;  Service: Orthopedics;  Laterality: Left;  left total knee arthroplasty   MASTECTOMY SUBCUTANEOUS Bilateral    MULTIPLE TOOTH EXTRACTIONS     ORIF FINGER / THUMB FRACTURE Right ~ 1980   "repair of thumb injury"   PROSTATE BIOPSY     REPLACEMENT TOTAL KNEE BILATERAL Bilateral 2012   TEE WITHOUT CARDIOVERSION N/A 03/04/2017   Procedure: TRANSESOPHAGEAL ECHOCARDIOGRAM (TEE);  Surgeon: Odie Benne, MD;  Location: [facility] OR;  Service: Open Heart Surgery;  Laterality: N/A;   TOTAL KNEE REVISION Right 05/18/2019   Procedure: RIGHT PATELLA REVISION/REMOVAL;  Surgeon: Jasmine Mesi, MD;  Location: [facility] OR;  Service: Orthopedics;  Laterality: Right;   TRANSCATHETER AORTIC VALVE REPLACEMENT, TRANSFEMORAL N/A 03/04/2017   Procedure: TRANSCATHETER AORTIC VALVE REPLACEMENT, TRANSFEMORAL;  Surgeon: Odie Benne, MD;  Location: [facility] OR;  Service: Open Heart Surgery;  Laterality: N/A;    Allergies  Allergen Reactions   Peanut-Containing Drug Products Anaphylaxis   Sulfonamide Derivatives Anaphylaxis   Amlodipine  Swelling and Other (See Comments)    Swelling in ankles   Eliquis  [Apixaban ] Other (See Comments)    Back/hip pain   Lisinopril  Cough   Xarelto  [Rivaroxaban ] Other (See Comments)    Back/hip pain    Allergies as of 12/15/2023       Reactions   Peanut-containing Drug Products Anaphylaxis   Sulfonamide Derivatives Anaphylaxis   Amlodipine  Swelling, Other (See Comments)   Swelling in ankles   Eliquis  [apixaban ] Other (See Comments)   Back/hip pain   Lisinopril   Cough   Xarelto  [rivaroxaban ] Other (See Comments)   Back/hip pain        Medication List        Accurate as of December 15, 2023  2:52 PM. If you have any questions, ask your nurse or doctor.          albuterol  108 (90 Base) MCG/ACT inhaler Commonly known as: VENTOLIN  HFA Inhale 2 puffs into the lungs every 4 (four) hours as needed for wheezing or shortness of breath.   allopurinol  100 MG tablet Commonly known as: ZYLOPRIM  Take 1 tablet (100 mg total) by mouth daily.   amiodarone  200 MG tablet Commonly known as: PACERONE  Take 1 tablet (200 mg total) by mouth daily.   aspirin  81 MG tablet Take 1 tablet (81 mg total) by mouth daily.   atorvastatin  20 MG tablet Commonly known as: LIPITOR TAKE ONE TABLET ONCE DAILY   CAL MAG ZINC +D3 PO Take 2 tablets by mouth in the morning. 500 mg / 7 mg / 25 mcg   cetirizine 10 MG tablet Commonly known as: ZYRTEC Take 10 mg by mouth 2 (two) times daily.   colchicine  0.6 MG tablet Take 1 tablet (0.6 mg total) by mouth every 6 (six) hours as needed (gout).   dabigatran  150 MG Caps capsule Commonly known as: Pradaxa  TAKE (1) CAPSULE TWICE DAILY.   Dexcom G6 Receiver Devi 1 Device by Does not apply route 3 (three) times daily. E11.69   Dexcom G6 Sensor Misc 1 Device by Does not apply route 3 (three) times daily. E11.69   Dexcom G6 Transmitter Misc 1 Device by Does not apply route 3 (three) times daily. E11.69   insulin  aspart 100 UNIT/ML injection Commonly known as: novoLOG  Inject 4 Units into the skin 3 (three) times daily before meals. Inject as per sliding scale: if 141 - 180 = 4 units Inject per sliding scale; 181 - 220 = 6 units Inject  per sliding scale; 221 - 260 = 8 units Inject per sliding scale; 261 - 300 = 10 units Inject per sliding scale; 301 - 350 = 12 units Inject per sliding scale; 351 - 400= 14 units Inject per sliding scale; 401+ = 16 units Inject per sliding scale, subcutaneously three times a day   isosorbide   mononitrate 30 MG 24 hr tablet Commonly known as: IMDUR  Take 1 tablet (30 mg total) by mouth daily.   ketotifen  0.025 % ophthalmic solution Commonly known as: ZADITOR  Place 1 drop into both eyes daily as needed (for itching).   levothyroxine  175 MCG tablet Commonly known as: SYNTHROID  Take 1 tablet (175 mcg total) by mouth daily before breakfast.   losartan  25 MG tablet Commonly known as: Cozaar  Take 0.5 tablets (12.5 mg total) by mouth daily.   magic mouthwash Soln Take 5 mLs by mouth as directed. Suspension contains equal amounts of Maalox Extra Strength, nystatin, and diphenhydramine . RX'ed by dentist   methocarbamol  500 MG tablet Commonly known as: ROBAXIN  Take 1 tablet (500 mg total) by mouth 4 (four) times daily as needed for muscle spasms.   metoCLOPramide  10 MG tablet Commonly known as: REGLAN  TAKE ONE TABLET BY MOUTH EVERY MORNING WITH BREAKFAST   metolazone  2.5 MG tablet Commonly known as: ZAROXOLYN  Take 1 tablet (2.5 mg total) by mouth once a week every Saturday   metoprolol  succinate 50 MG 24 hr tablet Commonly known as: TOPROL -XL Take 1 tablet (50 mg total) by mouth daily. Take with or immediately following a meal.   montelukast  10 MG tablet Commonly known as: SINGULAIR  TAKE ONE TABLET BY MOUTH ONCE DAILY IN THE EVENING   Multi-Vitamin Gummies Chew Chew 2 tablets by mouth See admin instructions. Chew 2 gummies by mouth in the morning   PreserVision AREDS 2 Caps Take 1 capsule by mouth in the morning and at bedtime.   pantoprazole  40 MG tablet Commonly known as: PROTONIX  Take 1 tablet (40 mg total) by mouth daily.   potassium chloride  SA 20 MEQ tablet Commonly known as: KLOR-CON  M Take 1 tablet (20 mEq total) by mouth 2 (two) times daily.   predniSONE  10 MG tablet Commonly known as: DELTASONE  Take 2 tabs daily x 5 days then 1 tab daily x 5 days then stop   silver  sulfADIAZINE  1 % cream Commonly known as: Silvadene  Apply 1 Application topically 2  (two) times daily.   spironolactone  25 MG tablet Commonly known as: ALDACTONE  Take 1 tablet (25 mg total) by mouth daily.   torsemide  20 MG tablet Commonly known as: DEMADEX  Take 1 tablet (20 mg total) by mouth daily.   triamcinolone  cream 0.1 % Commonly known as: KENALOG  Apply 1 application  topically daily as needed for dry skin (affected areas).   valACYclovir  500 MG tablet Commonly known as: VALTREX  Take 500 mg by mouth 2 (two) times daily as needed (cold sores).   venlafaxine  XR 37.5 MG 24 hr capsule Commonly known as: Effexor  XR Take 1 capsule (37.5 mg total) by mouth daily with breakfast.        Review of Systems  Constitutional:  Negative for appetite change, fatigue and fever.  HENT:  Negative for congestion and trouble swallowing.   Eyes:  Negative for visual disturbance.  Respiratory:  Positive for cough. Negative for shortness of breath and wheezing.   Cardiovascular:  Positive for leg swelling. Negative for chest pain and palpitations.  Gastrointestinal:  Negative for abdominal pain and constipation.  Genitourinary:  Positive for frequency.  Negative for difficulty urinating and urgency.       Nocturnal urination about every 2-3 hours.   Musculoskeletal:  Positive for arthralgias and gait problem.  Skin:  Positive for rash.  Neurological:  Negative for dizziness, speech difficulty and weakness.  Psychiatric/Behavioral:  Positive for dysphoric mood. Negative for confusion and sleep disturbance. The patient is not nervous/anxious.     Immunization History  Administered Date(s) Administered   Fluad Quad(high Dose 65+) 05/06/2019, 05/30/2020, 06/18/2021, 05/27/2023   Hep A / Hep B 04/06/2012, 10/07/2012   Hepatitis B 05/07/2012   Influenza Split 06/28/2013   Influenza Whole 06/05/2007, 05/13/2008, 06/06/2009, 05/30/2010   Influenza, High Dose Seasonal PF 05/15/2018, 05/31/2022   Influenza,inj,Quad PF,6+ Mos 05/20/2016   Influenza-Unspecified 05/25/2014,  07/08/2015, 05/06/2017   Moderna Covid-19 Vaccine Bivalent Booster 37yrs & up 05/31/2022   PFIZER(Purple Top)SARS-COV-2 Vaccination 09/28/2019, 10/12/2019, 05/30/2020   PPD Test 03/31/2023   Pfizer Covid-19 Vaccine Bivalent Booster 65yrs & up 11/18/2021   Pneumococcal Conjugate-13 09/18/2015   Pneumococcal Polysaccharide-23 05/13/2008, 05/25/2014   Td 05/30/2010   Tdap 10/27/2020   Zoster Recombinant(Shingrix) 05/18/2018, 10/16/2018   Zoster, Live 10/21/2007   Pertinent  Health Maintenance Due  Topic Date Due   FOOT EXAM  Never done   OPHTHALMOLOGY EXAM  01/07/2024   INFLUENZA VACCINE  03/26/2024   HEMOGLOBIN A1C  04/12/2024      03/25/2022   11:20 AM 10/10/2022   11:28 AM 03/31/2023   10:48 AM 10/09/2023    1:31 PM 11/13/2023    1:50 PM  Fall Risk  Falls in the past year? 0 1 0 0 0  Was there an injury with Fall? 0 1 0 0 0  Was there an injury with Fall? - Comments  Back pain     Fall Risk Category Calculator 0 3 0 0 0  Fall Risk Category (Retired) Low      (RETIRED) Patient Fall Risk Level Low fall risk      Patient at Risk for Falls Due to Medication side effect No Fall Risks No Fall Risks History of fall(s) Impaired balance/gait;Impaired mobility  Fall risk Follow up Falls evaluation completed;Education provided;Falls prevention discussed Falls evaluation completed Falls evaluation completed Falls evaluation completed Falls evaluation completed   Functional Status Survey:    Vitals:   12/15/23 1334  BP: 116/66  Pulse: 76  Resp: 19  Temp: (!) 97.3 F (36.3 C)  SpO2: 96%  Weight: 247 lb 3.2 oz (112.1 kg)   Body mass index is 39.3 kg/m. Physical Exam Vitals and nursing note reviewed.  Constitutional:      Appearance: He is obese.  HENT:     Head: Normocephalic and atraumatic.     Nose: Nose normal.  Eyes:     Extraocular Movements: Extraocular movements intact.     Conjunctiva/sclera: Conjunctivae normal.     Pupils: Pupils are equal, round, and reactive to  light.  Cardiovascular:     Rate and Rhythm: Normal rate. Rhythm irregular.     Heart sounds: No murmur heard.    Comments: Left upper chest pace maker, defibrillator  Pulmonary:     Effort: Pulmonary effort is normal.     Breath sounds: Rales present.     Comments: Right base rales.  Abdominal:     General: Bowel sounds are normal.     Palpations: Abdomen is soft.     Tenderness: There is no abdominal tenderness.  Musculoskeletal:     Cervical back: Normal range of motion and  neck supple.     Right lower leg: Edema present.     Left lower leg: Edema present.     Comments: 1+ edema BLE, R>L  Skin:    General: Skin is warm and dry.     Findings: Rash present.     Comments: Redness in groin area R+L  Neurological:     General: No focal deficit present.     Mental Status: He is alert and oriented to person, place, and time. Mental status is at baseline.     Motor: No weakness.     Gait: Gait abnormal.  Psychiatric:        Mood and Affect: Mood normal.        Behavior: Behavior normal.        Thought Content: Thought content normal.        Judgment: Judgment normal.     Comments: Stated he feels difficulty adjusting to [facility]      Labs reviewed: Recent Labs    06/30/23 1616 07/01/23 0355 07/02/23 0415 11/25/23 0728 11/26/23 0417 11/27/23 0410  NA  --  140   < > 134* 134* 134*  K  --  4.1   < > 4.0 3.8 3.8  CL  --  106   < > 98 97* 96*  CO2  --  24   < > 27 27 29   GLUCOSE  --  125*   < > 87 94 106*  BUN  --  14   < > 41* 43* 46*  CREATININE  --  1.30*   < > 1.13 1.11 1.32*  CALCIUM   --  7.8*   < > 7.8* 7.8* 7.7*  MG 2.7*  --    < > 2.7* 2.6* 2.5*  PHOS 2.3* 3.3  --   --   --   --    < > = values in this interval not displayed.   Recent Labs    11/17/23 1211 11/18/23 1918 11/19/23 1844 11/20/23 1317  AST 53* 76*  --  93*  ALT 38 45*  --  70*  ALKPHOS 135* 147*  --  151*  BILITOT 1.7* 0.8  --  0.8  PROT 6.9 6.7 7.3 6.5  ALBUMIN  2.2* 2.1*  --  2.1*    Recent Labs    11/25/23 0422 11/26/23 0417 11/27/23 0410  WBC 19.5* 19.1* 20.4*  NEUTROABS 16.4* 16.4* 16.9*  HGB 13.4 13.4 13.3  HCT 41.5 43.0 41.9  MCV 86.6 86.2 84.5  PLT 365 345 352   Lab Results  Component Value Date   TSH 0.169 (L) 11/18/2023   Lab Results  Component Value Date   HGBA1C 6.8 (H) 10/14/2023   Lab Results  Component Value Date   CHOL 108 10/14/2023   HDL 33 (L) 10/14/2023   LDLCALC 55 10/14/2023   TRIG 113 10/14/2023   CHOLHDL 3.3 10/14/2023    Significant Diagnostic Results in last 30 days:  CUP PACEART REMOTE DEVICE CHECK Result Date: 11/29/2023 Scheduled remote reviewed. Normal device function.  Presenting rhythm:  AF, BIV paced, intermittent VSR pacing.  AF burden 100%, known permanent on Pradaxa  per Epic.  BIV pacing 74.6%, consistent with historical trends and likely due to AF. Decrease in CRT during period of elevated average v. rate per trend.  HF diagnostics currently abnormal. Next remote 91 days. - CS, CVRS  DG Swallowing Func-Speech Pathology Result Date: 11/24/2023 Table formatting from the original result was not included. Modified Barium Swallow Study  Patient Details Name: Justin [name] MRN: [id number] Date of Birth: December 20, 1933 Today's Date: 11/24/2023 HPI/PMH: HPI: 89 yo presenting 3/24 with shortness of breath. CXR showed asymmetric interstitial opacities concerning for atypical pneumonia versus pulmonary edema or both. RVP swab positive for coronavirus OC 43.  He also became more hypoxic requiring salter high flow. PMH includes: GERD, BPH, CAD s/p CABG, CHF, HTN, HLD, DMII, AS s/p TAVR, PAF, hypothyroidism Clinical Impression: Clinical Impression: Patient presents with an oropharyngeal and an esophageal dysphagia as per this MBS. Mastication and oral transit of solid and puree boluses was [facility]. Swallow was initiated at level of vallecular sinus with puree and mechanical soft solids and honey thick liquids, initiated at level of posterior  laryngeal surface of the epiglottis with nectar thick liquids and delayed at level of pyriform sinus with thin liquids. Anterior hyoid excursion, laryngeal elevation both partial in completion. With small, single sips of thin liquids, no aspiration observed but with consecutive sips, patient with sensed aspiration (PAS 7) with thin liquids secondary to inadequate and mistimed closure of laryngeal vestibule. Chin tuck posture was not beneficial to reduce aspiration risk or amount. Esophageal sweep revealed retrograde movement and stasis of barium in distal esophagus. SLP educated patient on results of this MBS, recommendation for small, single cup sips of thin liquids and not straws, no consecutive cup sips of thin liquids. SLP also recommending management of patient's GERD and for him to follow general GERD/Reflux precautions such as not laying down flat after eating, sitting upright and remaining upright for 45 minutes after meals. Patient is at risk for post-prandial aspiration. SLP will continue to follow. Factors that may increase risk of adverse event in presence of aspiration Roderick Civatte & Jessy Morocco 2021): Factors that may increase risk of adverse event in presence of aspiration Roderick Civatte & Jessy Morocco 2021): Respiratory or GI disease; Poor general health and/or compromised immunity; Frail or deconditioned Recommendations/Plan: Swallowing Evaluation Recommendations Swallowing Evaluation Recommendations Recommendations: PO diet PO Diet Recommendation: Regular; Thin liquids (Level 0) Liquid Administration via: No straw; Cup Medication Administration: Whole meds with puree Supervision: Patient able to self-feed Swallowing strategies  : Slow rate; Small bites/sips Postural changes: Position pt fully upright for meals; Stay upright 30-60 min after meals Oral care recommendations: Oral care BID (2x/day) Treatment Plan Treatment Plan Treatment recommendations: Therapy as outlined in treatment plan below Follow-up  recommendations: Skilled nursing-short term rehab (<3 hours/day) Functional status assessment: Patient has had a recent decline in their functional status and demonstrates the ability to make significant improvements in function in a reasonable and predictable amount of time. Treatment frequency: Min 2x/week Treatment duration: 1 week Interventions: Aspiration precaution training; Diet toleration management by SLP; Patient/family education Recommendations Recommendations for follow up therapy are one component of a multi-disciplinary discharge planning process, led by the attending physician.  Recommendations may be updated based on patient status, additional functional criteria and insurance authorization. Assessment: Orofacial Exam: No data recorded Anatomy: Anatomy: Prominent cricopharyngeus Boluses Administered: Boluses Administered Boluses Administered: Thin liquids (Level 0); Mildly thick liquids (Level 2, nectar thick); Moderately thick liquids (Level 3, honey thick); Puree; Solid  Oral Impairment Domain: Oral Impairment Domain Lip Closure: No labial escape Tongue control during bolus hold: Not tested Bolus preparation/mastication: Timely and efficient chewing and mashing Bolus transport/lingual motion: Brisk tongue motion Oral residue: Complete oral clearance Location of oral residue : N/A Initiation of pharyngeal swallow : Valleculae; Pyriform sinuses  Pharyngeal Impairment Domain: Pharyngeal Impairment Domain Soft palate elevation: No bolus between soft palate (SP)/pharyngeal  wall (PW) Laryngeal elevation: Partial superior movement of thyroid  cartilage/partial approximation of arytenoids to epiglottic petiole Anterior hyoid excursion: Partial anterior movement Epiglottic movement: Complete inversion Laryngeal vestibule closure: Incomplete, narrow column air/contrast in laryngeal vestibule Pharyngeal stripping wave : Present - diminished Pharyngeal contraction (A/P view only): N/A Pharyngoesophageal segment  opening: Complete distension and complete duration, no obstruction of flow Tongue base retraction: No contrast between tongue base and posterior pharyngeal wall (PPW) Pharyngeal residue: Collection of residue within or on pharyngeal structures Location of pharyngeal residue: Valleculae; Pyriform sinuses; Pharyngeal wall  Esophageal Impairment Domain: Esophageal Impairment Domain Esophageal clearance upright position: Complete clearance, esophageal coating Pill: Pill Consistency administered: Puree Puree: WFL Penetration/Aspiration Scale Score: Penetration/Aspiration Scale Score 1.  Material does not enter airway: Mildly thick liquids (Level 2, nectar thick); Moderately thick liquids (Level 3, honey thick); Puree; Solid 5.  Material enters airway, CONTACTS cords and not ejected out: Thin liquids (Level 0) 7.  Material enters airway, passes BELOW cords and not ejected out despite cough attempt by patient: Thin liquids (Level 0) Compensatory Strategies: Compensatory Strategies Compensatory strategies: Yes Chin tuck: Ineffective Ineffective Chin Tuck: Thin liquid (Level 0)   General Information: No data recorded Diet Prior to this Study: Regular; Thin liquids (Level 0)   Temperature : Normal   No data recorded  Supplemental O2: Nasal cannula   History of Recent Intubation: No  Behavior/Cognition: Alert; Cooperative; Pleasant mood Self-Feeding Abilities: Able to self-feed Baseline vocal quality/speech: Normal Volitional Cough: Able to elicit Volitional Swallow: Able to elicit Exam Limitations: No limitations Goal Planning: Prognosis for improved oropharyngeal function: Good No data recorded No data recorded Patient/Family Stated Goal: plan is return to SNF Consulted and agree with results and recommendations: Patient Pain: Pain Assessment Pain Assessment: No/denies pain End of Session: Start Time:SLP Start Time (ACUTE ONLY): 1445 Stop Time: SLP Stop Time (ACUTE ONLY): 1505 Time Calculation:SLP Time Calculation (min)  (ACUTE ONLY): 20 min Charges: SLP Evaluations $ SLP Speech Visit: 1 Visit SLP Evaluations $MBS Swallow: 1 Procedure SLP visit diagnosis: SLP Visit Diagnosis: Dysphagia, oropharyngeal phase (R13.12); Dysphagia, pharyngoesophageal phase (R13.14) Past Medical History: Past Medical History: Diagnosis Date  Age-related macular degeneration, dry, both eyes   Allergic   "24/7; 365 days/year; I'm allergic to pollens, dust, all southern grasses/trees, mold, mildue, cats, dogs" (01/07/2017)  Anal fissure   Asthma   sees Dr. Elverna Hamman   Benign prostatic hypertrophy   (sees Dr. Verdel Gitelman  CAD (coronary artery disease)   a. s/p CABG 2007. b. Cath 08/2016 - 4/4 patent grafts.  Carotid bruit   carotid u/s 10/10: 0.39% bilaterally  Chronic combined systolic and diastolic CHF (congestive heart failure) (HCC)   Complication of anesthesia 1980s  "w/anal cyst OR, he gave me a saddle block then put a narcotic in spinal cord; had a severe reaction to that" (01/07/2017)  Congestive heart failure (CHF) [facility])   ED (erectile dysfunction)   Family history of adverse reaction to anesthesia   "daughter wakes up during OR" (01/07/2017)  GERD (gastroesophageal reflux disease)   Gout   HTN (hypertension)   Hx of colonic polyps   (sees Dr. Elvin Hammer)  Hyperlipidemia   Hypothyroidism   Moderate to severe aortic stenosis   a. s/p TAVR 02/2017.  Myocardial infarction [facility]) ~ 2000  Obesity   Osteoarthritis   "was in my knees, hands" (01/07/2017 )  PAF (paroxysmal atrial fibrillation) (HCC)   a. documented post TAVR.  Precancerous skin lesion   (sees Dr. 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  29 mm Edwards Sapien 3 transcatheter heart valve placed via percutaneous right transfemoral approach Past Surgical History: Past Surgical History: Procedure Laterality Date  ANUS SURGERY    "opened it back up cause it wouldn't heal; wound up w/a fissure" (01/07/2017)  BIV ICD INSERTION  CRT-D N/A 05/02/2017  Procedure: BIV ICD INSERTION CRT-D;  Surgeon: Verona Goodwill, MD;  Location: [facility] INVASIVE CV LAB;  Service: Cardiovascular;  Laterality: N/A;  CARDIAC CATHETERIZATION  10/29/2005  CARDIAC CATHETERIZATION N/A 08/27/2016  Procedure: Right/Left Heart Cath and Coronary/Graft Angiography;  Surgeon: Odie Benne, MD;  Location: [facility] INVASIVE CV LAB;  Service: Cardiovascular;  Laterality: N/A;  CARDIOVERSION N/A 04/24/2021  Procedure: CARDIOVERSION;  Surgeon: Elmyra Haggard, MD;  Location: [facility] ENDOSCOPY;  Service: Cardiovascular;  Laterality: N/A;  CARDIOVERSION N/A 11/08/2021  Procedure: CARDIOVERSION;  Surgeon: Hazle Lites, MD;  Location: [facility] ENDOSCOPY;  Service: Cardiovascular;  Laterality: N/A;  CARDIOVERSION N/A 02/06/2022  Procedure: CARDIOVERSION;  Surgeon: Loyde Rule, MD;  Location: [facility] ENDOSCOPY;  Service: Cardiovascular;  Laterality: N/A;  CATARACT EXTRACTION W/ INTRAOCULAR LENS  IMPLANT, BILATERAL Bilateral   CATARACT EXTRACTION, BILATERAL  2012  COLONOSCOPY  06/30/2008  no repeats needed   COLONOSCOPY    had 3 or 4 in the past   CORONARY ARTERY BYPASS GRAFT  2007  "CABG X4"  CYST EXCISION PERINEAL  1980s  HAMMER TOE SURGERY Bilateral   ICD GENERATOR CHANGEOUT N/A 05/30/2023  Procedure: ICD GENERATOR CHANGEOUT;  Surgeon: Verona Goodwill, MD;  Location: [facility] INVASIVE CV LAB;  Service: Cardiovascular;  Laterality: N/A;  JOINT REPLACEMENT    KNEE ARTHROPLASTY  07/30/2011  Procedure: COMPUTER ASSISTED TOTAL KNEE ARTHROPLASTY;  Surgeon: Jasmine Mesi;  Location: [facility] OR;  Service: Orthopedics;  Laterality: Left;  left total knee arthroplasty  MASTECTOMY SUBCUTANEOUS Bilateral   MULTIPLE TOOTH EXTRACTIONS    ORIF FINGER / THUMB FRACTURE Right ~ 1980  "repair of thumb injury"  PROSTATE BIOPSY    REPLACEMENT TOTAL KNEE BILATERAL Bilateral 2012  TEE WITHOUT CARDIOVERSION N/A 03/04/2017  Procedure: TRANSESOPHAGEAL ECHOCARDIOGRAM (TEE);  Surgeon: Odie Benne, MD;  Location: [facility] OR;   Service: Open Heart Surgery;  Laterality: N/A;  TOTAL KNEE REVISION Right 05/18/2019  Procedure: RIGHT PATELLA REVISION/REMOVAL;  Surgeon: Jasmine Mesi, MD;  Location: [facility] OR;  Service: Orthopedics;  Laterality: Right;  TRANSCATHETER AORTIC VALVE REPLACEMENT, TRANSFEMORAL N/A 03/04/2017  Procedure: TRANSCATHETER AORTIC VALVE REPLACEMENT, TRANSFEMORAL;  Surgeon: Odie Benne, MD;  Location: [facility] OR;  Service: Open Heart Surgery;  Laterality: N/A; Jacqualine Mater, MA, CCC-SLP Speech Therapy   DG CHEST PORT 1 VIEW Result Date: 11/20/2023 CLINICAL DATA:  Hypoxia. EXAM: PORTABLE CHEST 1 VIEW COMPARISON:  November 19, 2023. FINDINGS: Stable cardiomegaly. Status post coronary artery bypass graft and transcatheter aortic valve repair. Left-sided defibrillator is unchanged. Minimal left basilar subsegmental atelectasis and effusion is noted. Stable mild diffuse right lung opacity is noted concerning for pneumonia. Bony thorax is unremarkable. IMPRESSION: Minimal left basilar subsegmental atelectasis and left pleural effusion. Stable mild right diffuse lung opacities as noted above. Electronically Signed   By: Rosalene Colon M.D.   On: 11/20/2023 12:00   DG CHEST PORT 1 VIEW Result Date: 11/19/2023 CLINICAL DATA:  [id number] S/P thoracentesis [id number] EXAM: PORTABLE CHEST 1 VIEW COMPARISON:  Chest x-ray 11/18/2023, CT chest 11/19/2023 FINDINGS: Left chest wall triple lead pacemaker defibrillator. The heart and mediastinal  contours are unchanged. Atherosclerotic plaque. Aortic valve replacement. Persistent patchy right lung airspace opacities. No pulmonary edema. Interval decrease in trace left pleural effusion. No definite right pleural effusion. No pneumothorax. No acute osseous abnormality.  Sternotomy wires are intact. IMPRESSION: 1. Persistent patchy right lung airspace opacities better evaluated on CT chest 11/19/2023. 2. Interval decrease in trace left pleural effusion. 3.  Aortic Atherosclerosis  (ICD10-I70.0). Electronically Signed   By: Morgane  Naveau M.D.   On: 11/19/2023 19:48   CT CHEST WO CONTRAST Result Date: 11/19/2023 CLINICAL DATA:  Pneumonia, complications suspected. EXAM: CT CHEST WITHOUT CONTRAST TECHNIQUE: Multidetector CT imaging of the chest was performed following the standard protocol without IV contrast. RADIATION DOSE REDUCTION: This exam was performed according to the departmental dose-optimization program which includes automated exposure control, adjustment of the mA and/or kV according to patient size and/or use of iterative reconstruction technique. COMPARISON:  Chest radiograph 11/18/2023 and cardiac CT 10/28/2016 FINDINGS: Cardiovascular: Left chest ICD. Patient has biventricular leads. Status post TAVR. Heart size is within normal limits. No significant pericardial effusion. Status post CABG. Atherosclerotic calcifications in thoracic aorta without aneurysm. Prominent atherosclerotic calcifications in the proximal abdominal aorta particularly near the origin of the celiac trunk and SMA. Again noted is a tortuous innominate artery without prominent calcifications involving the innominate artery and proximal right subclavian artery. Mediastinum/Nodes: Small mediastinal lymph nodes. No significant hilar lymphadenopathy but limited evaluation without intravascular contrast. No significant axillary lymph node enlargement. Lungs/Pleura: Moderate sized left pleural effusion with compressive atelectasis in left lower lobe. Trachea and mainstem bronchi are patent. Extensive airspace disease throughout the right lung involving right upper lobe, right middle lobe and right lower lobe. Near complete collapse of the left lower lobe. Upper Abdomen: Images of the upper abdomen are unremarkable. Musculoskeletal: No acute bone abnormality. IMPRESSION: 1. Extensive airspace disease throughout the right lung compatible with pneumonia. 2. Moderate sized left pleural effusion with compressive  atelectasis in the left lower lobe. 3. Aortic Atherosclerosis (ICD10-I70.0). Severe calcified plaque in the abdominal aorta at the origin of the visualized visceral arteries. Electronically Signed   By: Elene Griffes M.D.   On: 11/19/2023 11:03   DG CHEST PORT 1 VIEW Result Date: 11/18/2023 CLINICAL DATA:  Hypoxia EXAM: PORTABLE CHEST 1 VIEW COMPARISON:  11/17/2023 FINDINGS: Left-sided implanted cardiac device. Stable cardiomegaly status post sternotomy, CABG, and TAVR. Aortic atherosclerosis. Airspace opacity throughout the right lung is unchanged. Improving aeration of the left lung base compared to prior. No appreciable pleural fluid collection. No pneumothorax. IMPRESSION: Airspace opacity throughout the right lung is unchanged. Improving aeration of the left lung base compared to prior. Electronically Signed   By: Leverne Reading D.O.   On: 11/18/2023 17:54   DG Abd 1 View Result Date: 11/18/2023 CLINICAL DATA:  Abdominal distension. EXAM: ABDOMEN - 1 VIEW COMPARISON:  Pelvic radiograph dated 02/27/2022. FINDINGS: Evaluation is limited due to body habitus. Moderate stool and air throughout the colon. No bowel dilatation or evidence of obstruction. Degenerative changes of the spine. No acute osseous pathology. IMPRESSION: Nonobstructive bowel gas pattern. Electronically Signed   By: Angus Bark M.D.   On: 11/18/2023 15:14   ECHOCARDIOGRAM LIMITED Result Date: 11/18/2023    ECHOCARDIOGRAM LIMITED REPORT   Patient Name:   TYREES CHOPIN Date of Exam: 11/18/2023 Medical Rec #:  [id number]        Height:       66.5 in Accession #:    [id number]       Weight:  246.0 lb Date of Birth:  [birth year]       BSA:          2.196 m Patient Age:    77 years         BP:           129/66 mmHg Patient Gender: M                HR:           101 bpm. Exam Location:  Inpatient Procedure: Limited Echo, Cardiac Doppler, Color Doppler and Intracardiac            Opacification Agent (Both Spectral and Color Flow  Doppler were            utilized during procedure). Indications:    Acute Respiratory Distress  History:        Patient has prior history of Echocardiogram examinations, most                 recent 07/01/2023.  Sonographer:    Reta Cassis Referring Phys: [id number] RUTWIJ JOSHI  Sonographer Comments: Technically difficult study due to poor echo windows. IMPRESSIONS  1. Left ventricular ejection fraction, by estimation, is 40 to 45%. The left ventricle has mildly decreased function. Left ventricular diastolic parameters are indeterminate. Abnormal wall motion adjacent to RV pacing lead.  2. Right ventricular systolic function is moderately reduced. The right ventricular size is moderately enlarged. There is normal pulmonary artery systolic pressure.  3. Left atrial size was severely dilated.  4. Right atrial size was severely dilated.  5. Large pleural effusion in the left lateral region.  6. The mitral valve is grossly normal. No evidence of mitral valve regurgitation. No evidence of mitral stenosis.  7. Tricuspid valve regurgitation is mild to moderate.  8. The aortic valve has been repaired/replaced. Aortic valve regurgitation is not visualized. Echo findings are consistent with normal structure and function of the aortic valve prosthesis. Aortic valve mean gradient measures 5.0 mmHg. Comparison(s): Prior images reviewed side by side. Pleural effusion is new. Study not optimized to assess for pericardial constriction; if concern for this CMR or heart cathterization can be considered. FINDINGS  Left Ventricle: Respirophasic variation in the mid septum. LV tissue Doppler imaging not performed. Left ventricular ejection fraction, by estimation, is 40 to 45%. The left ventricle has mildly decreased function. Definity  contrast agent was given IV to delineate the left ventricular endocardial borders. The left ventricular internal cavity size was normal in size. There is no left ventricular hypertrophy. Left ventricular  diastolic parameters are indeterminate.  LV Wall Scoring: The mid inferoseptal segment, apical septal segment, and apex are dyskinetic. Abnormal wall motion adjacent to RV pacing lead. Right Ventricle: The right ventricular size is moderately enlarged. Right ventricular systolic function is moderately reduced. There is normal pulmonary artery systolic pressure. The tricuspid regurgitant velocity is 2.84 m/s, and with an assumed right atrial pressure of 3 mmHg, the estimated right ventricular systolic pressure is 35.3 mmHg. Left Atrium: Left atrial size was severely dilated. Right Atrium: Right atrial size was severely dilated. Pericardium: Presence of epicardial fat layer. Mitral Valve: The mitral valve is grossly normal. No evidence of mitral valve stenosis. Tricuspid Valve: The tricuspid valve is normal in structure. Tricuspid valve regurgitation is mild to moderate. No evidence of tricuspid stenosis. Aortic Valve: The aortic valve has been repaired/replaced. Aortic valve regurgitation is not visualized. Aortic valve mean gradient measures 5.0 mmHg. Aortic valve peak gradient measures 8.2 mmHg. Aortic  valve area, by VTI measures 2.69 cm. There is a 29 mm Sapien prosthetic, stented (TAVR) valve present in the aortic position. Echo findings are consistent with normal structure and function of the aortic valve prosthesis. Aorta: The aortic root and ascending aorta are structurally normal, with no evidence of dilitation. Additional Comments: A is visualized in the right ventricle and right atrium. There is a large pleural effusion in the left lateral region. Spectral Doppler performed. Color Doppler performed.  LEFT VENTRICLE PLAX 2D LVIDd:         4.40 cm LVIDs:         3.30 cm LV PW:         1.00 cm LV IVS:        1.00 cm LVOT diam:     2.70 cm LV SV:         62 LV SV Index:   28 LVOT Area:     5.73 cm  LV Volumes (MOD) LV vol d, MOD A2C: 111.0 ml LV vol d, MOD A4C: 69.5 ml LV vol s, MOD A2C: 35.2 ml LV vol s, MOD  A4C: 41.4 ml LV SV MOD A2C:     75.8 ml LV SV MOD A4C:     69.5 ml LV SV MOD BP:      52.5 ml RIGHT VENTRICLE            IVC RV S prime:     5.33 cm/s  IVC diam: 2.60 cm LEFT ATRIUM            Index LA diam:      4.80 cm  2.19 cm/m LA Vol (A4C): 173.0 ml 78.78 ml/m  AORTIC VALVE AV Area (Vmax):    2.40 cm AV Area (Vmean):   2.23 cm AV Area (VTI):     2.69 cm AV Vmax:           143.00 cm/s AV Vmean:          97.700 cm/s AV VTI:            0.232 m AV Peak Grad:      8.2 mmHg AV Mean Grad:      5.0 mmHg LVOT Vmax:         60.00 cm/s LVOT Vmean:        38.100 cm/s LVOT VTI:          0.109 m LVOT/AV VTI ratio: 0.47  AORTA Ao Root diam: 3.70 cm TRICUSPID VALVE TR Peak grad:   32.3 mmHg TR Vmax:        284.00 cm/s  SHUNTS Systemic VTI:  0.11 m Systemic Diam: 2.70 cm Gloriann Larger MD Electronically signed by Gloriann Larger MD Signature Date/Time: 11/18/2023/2:34:51 PM    Final    DG Chest Port 1 View Result Date: 11/17/2023 CLINICAL DATA:  Cough.  Shortness of breath. EXAM: PORTABLE CHEST 1 VIEW COMPARISON:  10/21/2023. FINDINGS: Low lung volume. There are asymmetric moderately increased interstitial markings in the right lung when compared to the left lung. Note is made of left retrocardiac airspace opacity obscuring the left hemidiaphragm, descending thoracic aorta and blunting the left lateral costophrenic angle, suggesting combination of left lung atelectasis and/or consolidation with pleural effusion. There is subtle blunting of right lateral costophrenic angle, concerning for trace right pleural effusion. No pneumothorax on either side Moderately enlarged cardio-mediastinal silhouette, which is likely accentuated by low lung volume and AP technique. Prosthetic aortic valve noted. There are surgical staples along the heart border  and sternotomy wires, status post CABG (coronary artery bypass graft). There is a left sided 3-lead pacemaker. No acute osseous abnormalities. The soft tissues are within  normal limits. IMPRESSION: 1. Asymmetric moderately increased interstitial markings in the right lung when compared to the left lung, which may represent asymmetric pulmonary edema versus atypical pneumonia. 2. Left retrocardiac airspace opacity, which may represent combination of left lung atelectasis and/or consolidation with pleural effusion. 3. Trace right pleural effusion. Electronically Signed   By: Beula Brunswick M.D.   On: 11/17/2023 14:41    Assessment/Plan: Candidiasis  redness, irritation in groin areas and started Nystatin powder bid since 12/13/23 with little efficacy. Adding 0.1% Triamcinolone  cream bid to affected area until healed.   Leukocytosis  wbc 20.4 11/27/23, repeat CBC/diff.  GOUT no flare up over a year, R+L wrist, only takes Allopurinol , uric acid 7.6 10/10/22  Paroxysmal atrial fibrillation (HCC) s/p pace maker, defibrillator , on Amiodarone , Pradaxa , Metoprolol , followed by cardiology.   Hyperlipidemia  on ASA, Atorvastatin , LDL 55 10/14/23  Atherosclerosis of native coronary artery of native heart without angina pectoris Hx of CABF, TAVR, no chest pain, taking Isosorbide   Chronic congestive heart failure (HCC) mild edema BLE, RLE>LLE, off Farxiga  due to recurrent UTI, on Metoprolol , Metolazone , Spironolactone , Torsemide  Repeat UA C/S to r/o UTI  Hypothyroidism on Levothyroxine , TSH 3.27 10/14/23  Essential hypertension blood pressure is controlled, on Losartan , Metoprolol , diuretics.   Gastroesophageal reflux disease stable, on Omeprazole , Hx of Reglan  use  MDD (major depressive disorder)  better with Effexor , failed Zoloft , feels sleeps too much and depressed.   Chronic kidney disease, stage 3a (HCC) Bun/creat 46/1.32 11/27/23 Update CMP/eGFR  Type 2 diabetes mellitus (HCC) Hgb A1c 6.8 10/14/23 Continue insulin   Pleural effusion  left pleural effusion CT chest 11/19/23-s/p thoracentesis with 1000cc of bloody fluid removed exudative by [facility] CXR  11/09/33 showed improved left pleural effusion.  Repeat CXR ap/lateral to f/u.     Family/ staff Communication: plan of care reviewed with the patient and charge nurse.   Labs/tests ordered:  CBC/diff, CMP/eGFR, CXR ap/lateral, UA C/S

## 2023-12-15 NOTE — Assessment & Plan Note (Signed)
 blood pressure is controlled, on Losartan, Metoprolol, diuretics.

## 2023-12-15 NOTE — Assessment & Plan Note (Signed)
 better with Effexor , failed Zoloft , feels sleeps too much and depressed.

## 2023-12-15 NOTE — Assessment & Plan Note (Signed)
 Hgb A1c 6.8 10/14/23 Continue insulin 

## 2023-12-15 NOTE — Assessment & Plan Note (Signed)
 stable, on Omeprazole, Hx of Reglan use

## 2023-12-15 NOTE — Assessment & Plan Note (Signed)
 left pleural effusion CT chest 11/19/23-s/p thoracentesis with 1000cc of bloody fluid removed exudative by [facility] CXR 11/09/33 showed improved left pleural effusion.  Repeat CXR ap/lateral to f/u.

## 2023-12-16 LAB — HEPATIC FUNCTION PANEL
ALT: 33 U/L (ref 10–40)
AST: 33 (ref 14–40)
Alkaline Phosphatase: 160 — AB (ref 25–125)
Bilirubin, Total: 0.6

## 2023-12-16 LAB — BASIC METABOLIC PANEL WITH GFR
BUN: 25 — AB (ref 4–21)
CO2: 32 — AB (ref 13–22)
Chloride: 92 — AB (ref 99–108)
Creatinine: 1.3 (ref 0.6–1.3)
Glucose: 77
Potassium: 3.9 meq/L (ref 3.5–5.1)
Sodium: 134 — AB (ref 137–147)

## 2023-12-16 LAB — CBC AND DIFFERENTIAL
HCT: 41 (ref 41–53)
Hemoglobin: 12.8 — AB (ref 13.5–17.5)
Neutrophils Absolute: 5645
Platelets: 211 10*3/uL (ref 150–400)
WBC: 9.7

## 2023-12-16 LAB — COMPREHENSIVE METABOLIC PANEL WITH GFR
Albumin: 2.4 — AB (ref 3.5–5.0)
Calcium: 7.8 — AB (ref 8.7–10.7)
Globulin: 2.5
eGFR: 54

## 2023-12-16 LAB — CBC: RBC: 4.72 (ref 3.87–5.11)

## 2023-12-17 NOTE — Progress Notes (Unsigned)
 EPIC Encounter for ICM Monitoring  Patient Name: Justin [name] is a 89 y.o. male Date: 12/17/2023 Primary Care Physican: Mast, Man X, NP Primary Cardiologist: McAlhany/Sabharwal  Electrophysiologist: Victorino Grates Pacing: 76.4%  09/27/2022 Weight: 270 lbs 02/12/2023 Weight: 261 lbs 07/07/2023 Office Weight: 261 lbs 10/07/2023 Weight: 245-250 lbs 10/24/2023 Office Weight: 252 lbs   Time in AT/AF  24.0 hr/day (100.0%)   Spoke with daughter Justin [name] per DPR.   Pt continues to reside in nursing home and doing better.    Optivol thoracic impedance suggesting close to normal fluid levels since 3/27.   Prescribed: Torsemide  20 mg take 1 tablets (20 mg total) by mouth daily.   Potassium 20 mEq take 1 tablet by mouth twice a day Metolazone  2.5 mg Take 1 tablet by mouth once a week on Saturday (per 7/15 phone note should take extra 40 mEq of Potassium with Metolazone ). Spironolactone  25 mg take 1 tablet daily   Labs: 10/14/2023 Creatinine 1.62, BUN 37, Potassium 3.6, Sodium 134, GFR 41 A complete set of results can be found in Results Review.   Recommendations: No changes and encouraged to call if experiencing any fluid symptoms.   Follow-up plan: ICM clinic phone appointment on 01/20/2024.   91 day device clinic remote transmission 03/02/2024.     EP/Cardiology Office Visits:  12/23/2023 with Justin Laurence, NP.   12/23/2023 with HF clinic.  Recall 04/21/2024 with Dr Justin [name].      Copy of ICM check sent to Dr. Rodolfo [name].   3 month ICM trend: 12/15/2023.    12-14 Month ICM trend:     Justin Jain, RN 12/17/2023 3:50 PM

## 2023-12-18 DIAGNOSIS — N400 Enlarged prostate without lower urinary tract symptoms: Secondary | ICD-10-CM | POA: Diagnosis not present

## 2023-12-18 DIAGNOSIS — E785 Hyperlipidemia, unspecified: Secondary | ICD-10-CM | POA: Diagnosis not present

## 2023-12-18 DIAGNOSIS — I4891 Unspecified atrial fibrillation: Secondary | ICD-10-CM | POA: Diagnosis not present

## 2023-12-18 DIAGNOSIS — N390 Urinary tract infection, site not specified: Secondary | ICD-10-CM | POA: Diagnosis not present

## 2023-12-18 DIAGNOSIS — M6281 Muscle weakness (generalized): Secondary | ICD-10-CM | POA: Diagnosis not present

## 2023-12-18 DIAGNOSIS — J309 Allergic rhinitis, unspecified: Secondary | ICD-10-CM | POA: Diagnosis not present

## 2023-12-18 DIAGNOSIS — E119 Type 2 diabetes mellitus without complications: Secondary | ICD-10-CM | POA: Diagnosis not present

## 2023-12-18 DIAGNOSIS — K5900 Constipation, unspecified: Secondary | ICD-10-CM | POA: Diagnosis not present

## 2023-12-18 DIAGNOSIS — I130 Hypertensive heart and chronic kidney disease with heart failure and stage 1 through stage 4 chronic kidney disease, or unspecified chronic kidney disease: Secondary | ICD-10-CM | POA: Diagnosis not present

## 2023-12-18 DIAGNOSIS — I480 Paroxysmal atrial fibrillation: Secondary | ICD-10-CM | POA: Diagnosis not present

## 2023-12-18 DIAGNOSIS — M1990 Unspecified osteoarthritis, unspecified site: Secondary | ICD-10-CM | POA: Diagnosis not present

## 2023-12-18 DIAGNOSIS — R0600 Dyspnea, unspecified: Secondary | ICD-10-CM | POA: Diagnosis not present

## 2023-12-18 DIAGNOSIS — N3000 Acute cystitis without hematuria: Secondary | ICD-10-CM | POA: Diagnosis not present

## 2023-12-18 DIAGNOSIS — E1122 Type 2 diabetes mellitus with diabetic chronic kidney disease: Secondary | ICD-10-CM | POA: Diagnosis not present

## 2023-12-18 DIAGNOSIS — I493 Ventricular premature depolarization: Secondary | ICD-10-CM | POA: Diagnosis not present

## 2023-12-18 DIAGNOSIS — I5042 Chronic combined systolic (congestive) and diastolic (congestive) heart failure: Secondary | ICD-10-CM | POA: Diagnosis present

## 2023-12-18 DIAGNOSIS — E039 Hypothyroidism, unspecified: Secondary | ICD-10-CM | POA: Diagnosis not present

## 2023-12-18 DIAGNOSIS — Z87891 Personal history of nicotine dependence: Secondary | ICD-10-CM | POA: Diagnosis not present

## 2023-12-18 DIAGNOSIS — Z794 Long term (current) use of insulin: Secondary | ICD-10-CM | POA: Diagnosis not present

## 2023-12-18 DIAGNOSIS — N1831 Chronic kidney disease, stage 3a: Secondary | ICD-10-CM | POA: Diagnosis not present

## 2023-12-18 DIAGNOSIS — I2510 Atherosclerotic heart disease of native coronary artery without angina pectoris: Secondary | ICD-10-CM | POA: Diagnosis not present

## 2023-12-18 DIAGNOSIS — J40 Bronchitis, not specified as acute or chronic: Secondary | ICD-10-CM | POA: Diagnosis not present

## 2023-12-18 DIAGNOSIS — Z79899 Other long term (current) drug therapy: Secondary | ICD-10-CM | POA: Diagnosis not present

## 2023-12-18 DIAGNOSIS — J45998 Other asthma: Secondary | ICD-10-CM | POA: Diagnosis not present

## 2023-12-18 DIAGNOSIS — J129 Viral pneumonia, unspecified: Secondary | ICD-10-CM | POA: Diagnosis not present

## 2023-12-18 DIAGNOSIS — R051 Acute cough: Secondary | ICD-10-CM | POA: Diagnosis not present

## 2023-12-18 DIAGNOSIS — R252 Cramp and spasm: Secondary | ICD-10-CM | POA: Diagnosis not present

## 2023-12-18 DIAGNOSIS — F339 Major depressive disorder, recurrent, unspecified: Secondary | ICD-10-CM | POA: Diagnosis not present

## 2023-12-18 DIAGNOSIS — R110 Nausea: Secondary | ICD-10-CM | POA: Diagnosis not present

## 2023-12-18 DIAGNOSIS — J90 Pleural effusion, not elsewhere classified: Secondary | ICD-10-CM | POA: Diagnosis not present

## 2023-12-18 DIAGNOSIS — M109 Gout, unspecified: Secondary | ICD-10-CM | POA: Diagnosis not present

## 2023-12-18 DIAGNOSIS — I5022 Chronic systolic (congestive) heart failure: Secondary | ICD-10-CM | POA: Diagnosis not present

## 2023-12-18 DIAGNOSIS — R0982 Postnasal drip: Secondary | ICD-10-CM | POA: Diagnosis not present

## 2023-12-18 DIAGNOSIS — D72829 Elevated white blood cell count, unspecified: Secondary | ICD-10-CM | POA: Diagnosis not present

## 2023-12-18 DIAGNOSIS — Z952 Presence of prosthetic heart valve: Secondary | ICD-10-CM | POA: Diagnosis not present

## 2023-12-18 DIAGNOSIS — K219 Gastro-esophageal reflux disease without esophagitis: Secondary | ICD-10-CM | POA: Diagnosis not present

## 2023-12-18 DIAGNOSIS — E876 Hypokalemia: Secondary | ICD-10-CM | POA: Diagnosis not present

## 2023-12-18 DIAGNOSIS — Z950 Presence of cardiac pacemaker: Secondary | ICD-10-CM | POA: Diagnosis not present

## 2023-12-18 DIAGNOSIS — Z95810 Presence of automatic (implantable) cardiac defibrillator: Secondary | ICD-10-CM | POA: Diagnosis not present

## 2023-12-18 DIAGNOSIS — I10 Essential (primary) hypertension: Secondary | ICD-10-CM | POA: Diagnosis not present

## 2023-12-18 DIAGNOSIS — Z79890 Hormone replacement therapy (postmenopausal): Secondary | ICD-10-CM | POA: Diagnosis not present

## 2023-12-18 DIAGNOSIS — I4729 Other ventricular tachycardia: Secondary | ICD-10-CM | POA: Diagnosis not present

## 2023-12-18 DIAGNOSIS — J9601 Acute respiratory failure with hypoxia: Secondary | ICD-10-CM | POA: Diagnosis not present

## 2023-12-18 DIAGNOSIS — I25810 Atherosclerosis of coronary artery bypass graft(s) without angina pectoris: Secondary | ICD-10-CM | POA: Diagnosis not present

## 2023-12-18 DIAGNOSIS — E1169 Type 2 diabetes mellitus with other specified complication: Secondary | ICD-10-CM | POA: Diagnosis not present

## 2023-12-18 DIAGNOSIS — Z7901 Long term (current) use of anticoagulants: Secondary | ICD-10-CM | POA: Diagnosis not present

## 2023-12-18 DIAGNOSIS — I4821 Permanent atrial fibrillation: Secondary | ICD-10-CM | POA: Diagnosis present

## 2023-12-18 DIAGNOSIS — I350 Nonrheumatic aortic (valve) stenosis: Secondary | ICD-10-CM | POA: Diagnosis not present

## 2023-12-18 DIAGNOSIS — Z951 Presence of aortocoronary bypass graft: Secondary | ICD-10-CM | POA: Diagnosis not present

## 2023-12-18 DIAGNOSIS — I509 Heart failure, unspecified: Secondary | ICD-10-CM | POA: Diagnosis not present

## 2023-12-18 DIAGNOSIS — R1312 Dysphagia, oropharyngeal phase: Secondary | ICD-10-CM | POA: Diagnosis not present

## 2023-12-18 DIAGNOSIS — R0602 Shortness of breath: Secondary | ICD-10-CM | POA: Diagnosis present

## 2023-12-19 ENCOUNTER — Encounter: Payer: Self-pay | Admitting: Nurse Practitioner

## 2023-12-19 ENCOUNTER — Non-Acute Institutional Stay (SKILLED_NURSING_FACILITY): Payer: Self-pay | Admitting: Nurse Practitioner

## 2023-12-19 DIAGNOSIS — I10 Essential (primary) hypertension: Secondary | ICD-10-CM | POA: Diagnosis not present

## 2023-12-19 DIAGNOSIS — E039 Hypothyroidism, unspecified: Secondary | ICD-10-CM | POA: Diagnosis not present

## 2023-12-19 DIAGNOSIS — J90 Pleural effusion, not elsewhere classified: Secondary | ICD-10-CM

## 2023-12-19 DIAGNOSIS — E785 Hyperlipidemia, unspecified: Secondary | ICD-10-CM | POA: Diagnosis not present

## 2023-12-19 DIAGNOSIS — R1312 Dysphagia, oropharyngeal phase: Secondary | ICD-10-CM | POA: Diagnosis not present

## 2023-12-19 DIAGNOSIS — N1831 Chronic kidney disease, stage 3a: Secondary | ICD-10-CM

## 2023-12-19 DIAGNOSIS — F339 Major depressive disorder, recurrent, unspecified: Secondary | ICD-10-CM | POA: Insufficient documentation

## 2023-12-19 DIAGNOSIS — I25810 Atherosclerosis of coronary artery bypass graft(s) without angina pectoris: Secondary | ICD-10-CM | POA: Diagnosis not present

## 2023-12-19 DIAGNOSIS — K219 Gastro-esophageal reflux disease without esophagitis: Secondary | ICD-10-CM

## 2023-12-19 DIAGNOSIS — I509 Heart failure, unspecified: Secondary | ICD-10-CM | POA: Diagnosis not present

## 2023-12-19 DIAGNOSIS — M6281 Muscle weakness (generalized): Secondary | ICD-10-CM | POA: Diagnosis not present

## 2023-12-19 DIAGNOSIS — N390 Urinary tract infection, site not specified: Secondary | ICD-10-CM | POA: Diagnosis not present

## 2023-12-19 DIAGNOSIS — I480 Paroxysmal atrial fibrillation: Secondary | ICD-10-CM

## 2023-12-19 DIAGNOSIS — M109 Gout, unspecified: Secondary | ICD-10-CM | POA: Diagnosis not present

## 2023-12-19 DIAGNOSIS — D72829 Elevated white blood cell count, unspecified: Secondary | ICD-10-CM

## 2023-12-19 NOTE — Assessment & Plan Note (Signed)
 better with Effexor , failed Zoloft , feels sleeps too much and depressed.

## 2023-12-19 NOTE — Assessment & Plan Note (Signed)
 on ASA, Atorvastatin, LDL 55 10/14/23

## 2023-12-19 NOTE — Assessment & Plan Note (Signed)
 Hx of CABF, TAVR, no chest pain, taking Isosorbide

## 2023-12-19 NOTE — Assessment & Plan Note (Signed)
 12/15/23 CXR mild CHF, no gross focal lund consolidation, pleural effusion, pneumothorax

## 2023-12-19 NOTE — Assessment & Plan Note (Signed)
 stable, on Omeprazole , Hx of Reglan  use, Hgb 12.8 12/16/23

## 2023-12-19 NOTE — Assessment & Plan Note (Signed)
 mild edema BLE, RLE>LLE, off Farxiga  due to recurrent UTI, on Metoprolol , Metolazone , Spironolactone , Torsemide 

## 2023-12-19 NOTE — Assessment & Plan Note (Signed)
 no flare up over a year, R+L wrist, only takes Allopurinol, uric acid 7.6 10/10/22

## 2023-12-19 NOTE — Assessment & Plan Note (Addendum)
 urine culture 12/16/23 showed ESBL, susceptible to Nitrofurantoin, Ceftazidime, Cefepime. The patient is in his usual stated of health, he is afebrile.  C/o burning sensation and urinary frequency. Denied abd or lower back pain.  Will try Nitrofurantoin 100mg  bid x 7 days.

## 2023-12-19 NOTE — Assessment & Plan Note (Signed)
 Hgb A1c 6.8 10/14/23, on insulin 

## 2023-12-19 NOTE — Assessment & Plan Note (Signed)
 s/p pace maker, defibrillator , on Amiodarone, Pradaxa, Metoprolol, followed by cardiology.

## 2023-12-19 NOTE — Assessment & Plan Note (Signed)
 Bun/creat 25/1.29 12/16/23

## 2023-12-19 NOTE — Assessment & Plan Note (Signed)
 on Levothyroxine, TSH 3.27 10/14/23

## 2023-12-19 NOTE — Assessment & Plan Note (Signed)
 wbc 20.4 11/27/23<<9.7 12/16/23

## 2023-12-19 NOTE — Assessment & Plan Note (Signed)
 blood pressure is controlled, on Losartan, Metoprolol, diuretics.

## 2023-12-19 NOTE — Progress Notes (Signed)
 Location:   SNF [facility] Room Number: 66B Place of Service:  SNF (31) Provider: Abner Hoffman Amiyrah Lamere NP  Lukas Pelcher X, NP  Patient Care Team: Raylinn Kosar X, NP as PCP - General (Internal Medicine) Odie Benne, MD as PCP - Cardiology (Cardiology) Verona Goodwill, MD as PCP - Electrophysiology (Cardiology) Christina Coyer, MD as Consulting Physician (Urology) Alver Austin, [facility] (Inactive) as Pharmacist (Pharmacist) Dema Filler, MD as Consulting Physician (Ophthalmology)  Extended Emergency Contact Information Primary Emergency Contact: [facility] Address: [address]          [place], [place] [address] [place] Phone: [phone] Relation: Daughter Secondary Emergency Contact: Bluford Burkitt Address: [address]          [place], [place] [address] [place] Home Phone: [phone] Mobile Phone: [phone] Relation: Daughter  Code Status: DNR Goals of care: Advanced Directive information    11/28/2023    2:14 PM  Advanced Directives  Does Patient Have a Medical Advance Directive? Yes  Type of Estate agent of [place];Living will;Out of facility DNR (pink MOST or yellow form)  Does patient want to make changes to medical advance directive? No - Patient declined  Copy of Healthcare Power of Attorney in Chart? Yes - validated most recent copy scanned in chart (See row information)     Chief Complaint  Patient presents with  . Acute Visit    UTI    HPI:  Pt is a 89 y.o. male seen today for an acute visit for UTI, urine culture 12/16/23 showed ESBL, susceptible to Nitrofurantoin, Ceftazidime, Cefepime. The patient is in his usual stated of health, he is afebrile. C/o burning sensation and urinary frequency. Denied abd or lower back pain.    Hospitalized for hypoxic respiratory failure COVID/PNA and acute HRmrEF, left pleural effusion-s/p thoracentesis with 1000cc of bloody fluid  removed exudative by LDH,  improved SOB, Orthopnea, and edema BLE, treated with Prednisone , bronchodilators, antibiotics, and diuretics. Underwent evaluation of pulmonology, cardiology.   12/15/23 CXR mild CHF, no gross focal lund consolidation, pleural effusion, pneumothorax             Leukocytosis, wbc 20.4 11/27/23<<9.7 12/16/23             Gout, no flare up over a year, R+L wrist, only takes Allopurinol , uric acid 7.6 10/10/22             Afib, s/p pace maker, defibrillator , on Amiodarone , Pradaxa , Metoprolol , followed by cardiology.              HLD on ASA, Atorvastatin , LDL 55 10/14/23             CAD, Hx of CABF, TAVR, no chest pain, taking Isosorbide              CHF, mild edema BLE, RLE>LLE, off Farxiga  due to recurrent UTI, on Metoprolol , Metolazone , Spironolactone , Torsemide              Hypothyroidism, on Levothyroxine , TSH 3.27 10/14/23             HTN, blood pressure is controlled, on Losartan , Metoprolol , diuretics.              GERD, stable, on Omeprazole , Hx of Reglan  use, Hgb 12.8 12/16/23             Depression, better with Effexor , failed Zoloft , feels sleeps too much and depressed.  CKD Bun/creat 25/1.29 12/16/23             T2DM, Hgb A1c 6.8 10/14/23, on insulin          Past Medical History:  Diagnosis Date  . Age-related macular degeneration, dry, both eyes   . Allergic    "24/7; 365 days/year; I'm allergic to pollens, dust, all southern grasses/trees, mold, mildue, cats, dogs" (01/07/2017)  . Anal fissure   . Asthma    sees Dr. McQuaid   . Benign prostatic hypertrophy    (sees Dr. Verdel Gitelman  . CAD (coronary artery disease)    a. s/p CABG 2007. b. Cath 08/2016 - 4/4 patent grafts.  . Carotid bruit    carotid u/s 10/10: 0.39% bilaterally  . Chronic combined systolic and diastolic CHF (congestive heart failure) (HCC)   . Complication of anesthesia 1980s   "w/anal cyst OR, he gave me a saddle block then put a narcotic in spinal cord; had a severe reaction to  that" (01/07/2017)  . Congestive heart failure (CHF) (HCC)   . ED (erectile dysfunction)   . Family history of adverse reaction to anesthesia    "daughter wakes up during OR" (01/07/2017)  . GERD (gastroesophageal reflux disease)   . Gout   . HTN (hypertension)   . Hx of colonic polyps    (sees Dr. Elvin Hammer)  . Hyperlipidemia   . Hypothyroidism   . Moderate to severe aortic stenosis    a. s/p TAVR 02/2017.  Aaron Aas Myocardial infarction (HCC) ~ 2000  . Obesity   . Osteoarthritis    "was in my knees, hands" (01/07/2017 )  . PAF (paroxysmal atrial fibrillation) (HCC)    a. documented post TAVR.  Aaron Aas Precancerous skin lesion    (sees Dr. Fleurette Humbles)  . Prostate cancer (HCC) dx'd ~ 2014  . 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 ANUS SURGERY     "opened it back up cause it wouldn't heal; wound up w/a fissure" (01/07/2017)  . BIV ICD INSERTION CRT-D N/A 05/02/2017   Procedure: BIV ICD INSERTION CRT-D;  Surgeon: Verona Goodwill, MD;  [facility] INVASIVE CV LAB;  Service: Cardiovascular;  Laterality: N/A;  . CARDIAC CATHETERIZATION  10/29/2005  . CARDIAC CATHETERIZATION N/A 08/27/2016   Procedure: Right/Left Heart Cath and Coronary/Graft Angiography;  Surgeon: Odie Benne, MD;  Location: [facility] INVASIVE CV LAB;  Service: Cardiovascular;  Laterality: N/A;  . CARDIOVERSION N/A 04/24/2021   Procedure: CARDIOVERSION;  Surgeon: Elmyra Haggard, MD;  Location: [facility] ENDOSCOPY;  Service: Cardiovascular;  Laterality: N/A;  . CARDIOVERSION N/A 11/08/2021   Procedure: CARDIOVERSION;  Surgeon: Hazle Lites, MD;  Location: [facility] ENDOSCOPY;  Service: Cardiovascular;  Laterality: N/A;  . CARDIOVERSION N/A 02/06/2022   Procedure: CARDIOVERSION;  Surgeon: Loyde Rule, MD;  Location: [facility] ENDOSCOPY;  Service:  Cardiovascular;  Laterality: N/A;  . CATARACT EXTRACTION W/ INTRAOCULAR LENS  IMPLANT, BILATERAL Bilateral   . CATARACT EXTRACTION, BILATERAL  2012  . COLONOSCOPY  06/30/2008   no repeats needed   . COLONOSCOPY     had 3 or 4 in the past   . CORONARY ARTERY BYPASS GRAFT  2007   "CABG X4"  . CYST EXCISION PERINEAL  1980s  . HAMMER TOE SURGERY Bilateral   . ICD GENERATOR CHANGEOUT N/A 05/30/2023  Procedure: ICD GENERATOR CHANGEOUT;  Surgeon: Verona Goodwill, MD;  Location: [facility] INVASIVE CV LAB;  Service: Cardiovascular;  Laterality: N/A;  . JOINT REPLACEMENT    . KNEE ARTHROPLASTY  07/30/2011   Procedure: COMPUTER ASSISTED TOTAL KNEE ARTHROPLASTY;  Surgeon: Jasmine Mesi;  Location: [facility] OR;  Service: Orthopedics;  Laterality: Left;  left total knee arthroplasty  . MASTECTOMY SUBCUTANEOUS Bilateral   . MULTIPLE TOOTH EXTRACTIONS    . ORIF FINGER / THUMB FRACTURE Right ~ 1980   "repair of thumb injury"  . PROSTATE BIOPSY    . REPLACEMENT TOTAL KNEE BILATERAL Bilateral 2012  . TEE WITHOUT CARDIOVERSION N/A 03/04/2017   Procedure: TRANSESOPHAGEAL ECHOCARDIOGRAM (TEE);  Surgeon: Odie Benne, MD;  Location: [facility] OR;  Service: Open Heart Surgery;  Laterality: N/A;  . TOTAL KNEE REVISION Right 05/18/2019   Procedure: RIGHT PATELLA REVISION/REMOVAL;  Surgeon: Jasmine Mesi, MD;  Location: [facility] OR;  Service: Orthopedics;  Laterality: Right;  . TRANSCATHETER AORTIC VALVE REPLACEMENT, TRANSFEMORAL N/A 03/04/2017   Procedure: TRANSCATHETER AORTIC VALVE REPLACEMENT, TRANSFEMORAL;  Surgeon: Odie Benne, MD;  Location: [facility] OR;  Service: Open Heart Surgery;  Laterality: N/A;    Allergies  Allergen Reactions  . Peanut-Containing Drug Products Anaphylaxis  . Sulfonamide Derivatives Anaphylaxis  . Amlodipine  Swelling and Other (See Comments)    Swelling in ankles  . Eliquis  [Apixaban ] Other (See Comments)    Back/hip pain  . Lisinopril  Cough  . Xarelto  [Rivaroxaban ] Other (See  Comments)    Back/hip pain    Allergies as of 12/19/2023       Reactions   Peanut-containing Drug Products Anaphylaxis   Sulfonamide Derivatives Anaphylaxis   Amlodipine  Swelling, Other (See Comments)   Swelling in ankles   Eliquis  [apixaban ] Other (See Comments)   Back/hip pain   Lisinopril  Cough   Xarelto  [rivaroxaban ] Other (See Comments)   Back/hip pain        Medication List        Accurate as of December 19, 2023 11:59 PM. If you have any questions, ask your nurse or doctor.          albuterol  108 (90 Base) MCG/ACT inhaler Commonly known as: VENTOLIN  HFA Inhale 2 puffs into the lungs every 4 (four) hours as needed for wheezing or shortness of breath.   allopurinol  100 MG tablet Commonly known as: ZYLOPRIM  Take 1 tablet (100 mg total) by mouth daily.   amiodarone  200 MG tablet Commonly known as: PACERONE  Take 1 tablet (200 mg total) by mouth daily.   aspirin  81 MG tablet Take 1 tablet (81 mg total) by mouth daily.   atorvastatin  20 MG tablet Commonly known as: LIPITOR TAKE ONE TABLET ONCE DAILY   CAL MAG ZINC +D3 PO Take 2 tablets by mouth in the morning. 500 mg / 7 mg / 25 mcg   cetirizine 10 MG tablet Commonly known as: ZYRTEC Take 10 mg by mouth 2 (two) times daily.   colchicine  0.6 MG tablet Take 1 tablet (0.6 mg total) by mouth every 6 (six) hours as needed (gout).   dabigatran  150 MG Caps capsule Commonly known as: Pradaxa  TAKE (1) CAPSULE TWICE DAILY.   Dexcom G6 Receiver Devi 1 Device by Does not apply route 3 (three) times daily. E11.69   Dexcom G6 Sensor Misc 1 Device by Does not apply route 3 (three) times daily. E11.69   Dexcom G6 Transmitter Misc 1 Device by Does not apply route 3 (three) times daily. E11.69   insulin   aspart 100 UNIT/ML injection Commonly known as: novoLOG  Inject 4 Units into the skin 3 (three) times daily before meals. Inject as per sliding scale: if 141 - 180 = 4 units Inject per sliding scale; 181 - 220 = 6  units Inject per sliding scale; 221 - 260 = 8 units Inject per sliding scale; 261 - 300 = 10 units Inject per sliding scale; 301 - 350 = 12 units Inject per sliding scale; 351 - 400= 14 units Inject per sliding scale; 401+ = 16 units Inject per sliding scale, subcutaneously three times a day   isosorbide  mononitrate 30 MG 24 hr tablet Commonly known as: IMDUR  Take 1 tablet (30 mg total) by mouth daily.   ketotifen  0.025 % ophthalmic solution Commonly known as: ZADITOR  Place 1 drop into both eyes daily as needed (for itching).   levothyroxine  175 MCG tablet Commonly known as: SYNTHROID  Take 1 tablet (175 mcg total) by mouth daily before breakfast.   losartan  25 MG tablet Commonly known as: Cozaar  Take 0.5 tablets (12.5 mg total) by mouth daily.   magic mouthwash Soln Take 5 mLs by mouth as directed. Suspension contains equal amounts of Maalox Extra Strength, nystatin, and diphenhydramine . RX'ed by dentist   methocarbamol  500 MG tablet Commonly known as: ROBAXIN  Take 1 tablet (500 mg total) by mouth 4 (four) times daily as needed for muscle spasms.   metoCLOPramide  10 MG tablet Commonly known as: REGLAN  TAKE ONE TABLET BY MOUTH EVERY MORNING WITH BREAKFAST   metolazone  2.5 MG tablet Commonly known as: ZAROXOLYN  Take 1 tablet (2.5 mg total) by mouth once a week every Saturday   metoprolol  succinate 50 MG 24 hr tablet Commonly known as: TOPROL -XL Take 1 tablet (50 mg total) by mouth daily. Take with or immediately following a meal.   montelukast  10 MG tablet Commonly known as: SINGULAIR  TAKE ONE TABLET BY MOUTH ONCE DAILY IN THE EVENING   Multi-Vitamin Gummies Chew Chew 2 tablets by mouth See admin instructions. Chew 2 gummies by mouth in the morning   PreserVision AREDS 2 Caps Take 1 capsule by mouth in the morning and at bedtime.   pantoprazole  40 MG tablet Commonly known as: PROTONIX  Take 1 tablet (40 mg total) by mouth daily.   potassium chloride  SA 20 MEQ  tablet Commonly known as: KLOR-CON  M Take 1 tablet (20 mEq total) by mouth 2 (two) times daily.   predniSONE  10 MG tablet Commonly known as: DELTASONE  Take 2 tabs daily x 5 days then 1 tab daily x 5 days then stop   silver  sulfADIAZINE  1 % cream Commonly known as: Silvadene  Apply 1 Application topically 2 (two) times daily.   spironolactone  25 MG tablet Commonly known as: ALDACTONE  Take 1 tablet (25 mg total) by mouth daily.   torsemide  20 MG tablet Commonly known as: DEMADEX  Take 1 tablet (20 mg total) by mouth daily.   triamcinolone  cream 0.1 % Commonly known as: KENALOG  Apply 1 application  topically daily as needed for dry skin (affected areas).   valACYclovir  500 MG tablet Commonly known as: VALTREX  Take 500 mg by mouth 2 (two) times daily as needed (cold sores).   venlafaxine  XR 37.5 MG 24 hr capsule Commonly known as: Effexor  XR Take 1 capsule (37.5 mg total) by mouth daily with breakfast.        Review of Systems  Constitutional:  Negative for appetite change, fatigue and fever.  HENT:  Negative for congestion and trouble swallowing.   Eyes:  Negative for visual  disturbance.  Respiratory:  Positive for cough. Negative for shortness of breath and wheezing.   Cardiovascular:  Positive for leg swelling. Negative for chest pain and palpitations.  Gastrointestinal:  Negative for abdominal pain and constipation.  Genitourinary:  Positive for dysuria and frequency. Negative for urgency.       Nocturnal urination about every 2-3 hours.   Musculoskeletal:  Positive for arthralgias and gait problem.  Skin:  Positive for rash.  Neurological:  Negative for dizziness, speech difficulty and weakness.  Psychiatric/Behavioral:  Positive for dysphoric mood. Negative for confusion and sleep disturbance. The patient is not nervous/anxious.     Immunization History  Administered Date(s) Administered  . Fluad Quad(high Dose 65+) 05/06/2019, 05/30/2020, 06/18/2021, 05/27/2023   . Hep A / Hep B 04/06/2012, 10/07/2012  . Hepatitis B 05/07/2012  . Influenza Split 06/28/2013  . Influenza Whole 06/05/2007, 05/13/2008, 06/06/2009, 05/30/2010  . Influenza, High Dose Seasonal PF 05/15/2018, 05/31/2022  . Influenza,inj,Quad PF,6+ Mos 05/20/2016  . Influenza-Unspecified 05/25/2014, 07/08/2015, 05/06/2017  . Moderna Covid-19 Vaccine Bivalent Booster 34yrs & up 05/31/2022  . PFIZER(Purple Top)SARS-COV-2 Vaccination 09/28/2019, 10/12/2019, 05/30/2020  . PPD Test 03/31/2023  . Pfizer Covid-19 Vaccine Bivalent Booster 52yrs & up 11/18/2021  . Pneumococcal Conjugate-13 09/18/2015  . Pneumococcal Polysaccharide-23 05/13/2008, 05/25/2014  . Td 05/30/2010  . Tdap 10/27/2020  . Zoster Recombinant(Shingrix) 05/18/2018, 10/16/2018  . Zoster, Live 10/21/2007   Pertinent  Health Maintenance Due  Topic Date Due  . FOOT EXAM  Never done  . OPHTHALMOLOGY EXAM  01/07/2024  . INFLUENZA VACCINE  03/26/2024  . HEMOGLOBIN A1C  04/12/2024      03/25/2022   11:20 AM 10/10/2022   11:28 AM 03/31/2023   10:48 AM 10/09/2023    1:31 PM 11/13/2023    1:50 PM  Fall Risk  Falls in the past year? 0 1 0 0 0  Was there an injury with Fall? 0 1 0 0 0  Was there an injury with Fall? - Comments  Back pain     Fall Risk Category Calculator 0 3 0 0 0  Fall Risk Category (Retired) Low      (RETIRED) Patient Fall Risk Level Low fall risk      Patient at Risk for Falls Due to Medication side effect No Fall Risks No Fall Risks History of fall(s) Impaired balance/gait;Impaired mobility  Fall risk Follow up Falls evaluation completed;Education provided;Falls prevention discussed Falls evaluation completed Falls evaluation completed Falls evaluation completed Falls evaluation completed   Functional Status Survey:    Vitals:   12/19/23 1232  BP: 102/65  Pulse: 89  Resp: 18  Temp: (!) 97.3 F (36.3 C)  SpO2: 94%  Weight: 245 lb 12.8 oz (111.5 kg)   Body mass index is 39.08 kg/m. Physical  Exam Vitals and nursing note reviewed.  Constitutional:      Appearance: He is obese.  HENT:     Head: Normocephalic and atraumatic.     Nose: Nose normal.  Eyes:     Extraocular Movements: Extraocular movements intact.     Conjunctiva/sclera: Conjunctivae normal.     Pupils: Pupils are equal, round, and reactive to light.  Cardiovascular:     Rate and Rhythm: Normal rate. Rhythm irregular.     Heart sounds: No murmur heard.    Comments: Left upper chest pace maker, defibrillator  Pulmonary:     Effort: Pulmonary effort is normal.     Breath sounds: Rales present.     Comments: Right base rales.  Abdominal:     General: Bowel sounds are normal.     Palpations: Abdomen is soft.     Tenderness: There is no abdominal tenderness. There is no right CVA tenderness, left CVA tenderness, guarding or rebound.  Musculoskeletal:     Cervical back: Normal range of motion and neck supple.     Right lower leg: Edema present.     Left lower leg: Edema present.     Comments: 1+ edema BLE, R>L  Skin:    General: Skin is warm and dry.     Findings: Rash present.     Comments: Redness in groin area R+L  Neurological:     General: No focal deficit present.     Mental Status: He is alert and oriented to person, place, and time. Mental status is at baseline.     Motor: No weakness.     Gait: Gait abnormal.  Psychiatric:        Mood and Affect: Mood normal.        Behavior: Behavior normal.        Thought Content: Thought content normal.        Judgment: Judgment normal.     Comments: Stated he feels difficulty adjusting to [facility]     Labs reviewed: Recent Labs    06/30/23 1616 07/01/23 0355 07/02/23 0415 11/25/23 0728 11/26/23 0417 11/27/23 0410  NA  --  140   < > 134* 134* 134*  K  --  4.1   < > 4.0 3.8 3.8  CL  --  106   < > 98 97* 96*  CO2  --  24   < > 27 27 29   GLUCOSE  --  125*   < > 87 94 106*  BUN  --  14   < > 41* 43* 46*  CREATININE  --  1.30*   < > 1.13 1.11 1.32*   CALCIUM   --  7.8*   < > 7.8* 7.8* 7.7*  MG 2.7*  --    < > 2.7* 2.6* 2.5*  PHOS 2.3* 3.3  --   --   --   --    < > = values in this interval not displayed.   Recent Labs    11/17/23 1211 11/18/23 1918 11/19/23 1844 11/20/23 1317  AST 53* 76*  --  93*  ALT 38 45*  --  70*  ALKPHOS 135* 147*  --  151*  BILITOT 1.7* 0.8  --  0.8  PROT 6.9 6.7 7.3 6.5  ALBUMIN  2.2* 2.1*  --  2.1*   Recent Labs    11/25/23 0422 11/26/23 0417 11/27/23 0410  WBC 19.5* 19.1* 20.4*  NEUTROABS 16.4* 16.4* 16.9*  HGB 13.4 13.4 13.3  HCT 41.5 43.0 41.9  MCV 86.6 86.2 84.5  PLT 365 345 352   Lab Results  Component Value Date   TSH 0.169 (L) 11/18/2023   Lab Results  Component Value Date   HGBA1C 6.8 (H) 10/14/2023   Lab Results  Component Value Date   CHOL 108 10/14/2023   HDL 33 (L) 10/14/2023   LDLCALC 55 10/14/2023   TRIG 113 10/14/2023   CHOLHDL 3.3 10/14/2023    Significant Diagnostic Results in last 30 days:  CUP PACEART REMOTE DEVICE CHECK Result Date: 11/29/2023 Scheduled remote reviewed. Normal device function.  Presenting rhythm:  AF, BIV paced, intermittent VSR pacing.  AF burden 100%, known permanent on Pradaxa  per Epic.  BIV pacing 74.6%, consistent with  historical trends and likely due to AF. Decrease in CRT during period of elevated average v. rate per trend.  HF diagnostics currently abnormal. Next remote 91 days. - CS, CVRS  DG Swallowing Func-Speech Pathology Result Date: 11/24/2023 Table formatting from the original result was not included. Modified Barium Swallow Study Patient Details Name: ZAKIR HENNER MRN: [id number] Date of Birth: [birth year] Today's Date: 11/24/2023 HPI/PMH: HPI: 89 yo presenting 3/24 with shortness of breath. CXR showed asymmetric interstitial opacities concerning for atypical pneumonia versus pulmonary edema or both. RVP swab positive for coronavirus OC 43.  He also became more hypoxic requiring salter high flow. PMH includes: GERD, BPH, CAD s/p  CABG, CHF, HTN, HLD, DMII, AS s/p TAVR, PAF, hypothyroidism Clinical Impression: Clinical Impression: Patient presents with an oropharyngeal and an esophageal dysphagia as per this MBS. Mastication and oral transit of solid and puree boluses was [facility]. Swallow was initiated at level of vallecular sinus with puree and mechanical soft solids and honey thick liquids, initiated at level of posterior laryngeal surface of the epiglottis with nectar thick liquids and delayed at level of pyriform sinus with thin liquids. Anterior hyoid excursion, laryngeal elevation both partial in completion. With small, single sips of thin liquids, no aspiration observed but with consecutive sips, patient with sensed aspiration (PAS 7) with thin liquids secondary to inadequate and mistimed closure of laryngeal vestibule. Chin tuck posture was not beneficial to reduce aspiration risk or amount. Esophageal sweep revealed retrograde movement and stasis of barium in distal esophagus. SLP educated patient on results of this MBS, recommendation for small, single cup sips of thin liquids and not straws, no consecutive cup sips of thin liquids. SLP also recommending management of patient's GERD and for him to follow general GERD/Reflux precautions such as not laying down flat after eating, sitting upright and remaining upright for 45 minutes after meals. Patient is at risk for post-prandial aspiration. SLP will continue to follow. Factors that may increase risk of adverse event in presence of aspiration Roderick Civatte & Jessy Morocco 2021): Factors that may increase risk of adverse event in presence of aspiration Roderick Civatte & Jessy Morocco 2021): Respiratory or GI disease; Poor general health and/or compromised immunity; Frail or deconditioned Recommendations/Plan: Swallowing Evaluation Recommendations Swallowing Evaluation Recommendations Recommendations: PO diet PO Diet Recommendation: Regular; Thin liquids (Level 0) Liquid Administration via: No straw; Cup Medication  Administration: Whole meds with puree Supervision: Patient able to self-feed Swallowing strategies  : Slow rate; Small bites/sips Postural changes: Position pt fully upright for meals; Stay upright 30-60 min after meals Oral care recommendations: Oral care BID (2x/day) Treatment Plan Treatment Plan Treatment recommendations: Therapy as outlined in treatment plan below Follow-up recommendations: Skilled nursing-short term rehab (<3 hours/day) Functional status assessment: Patient has had a recent decline in their functional status and demonstrates the ability to make significant improvements in function in a reasonable and predictable amount of time. Treatment frequency: Min 2x/week Treatment duration: 1 week Interventions: Aspiration precaution training; Diet toleration management by SLP; Patient/family education Recommendations Recommendations for follow up therapy are one component of a multi-disciplinary discharge planning process, led by the attending physician.  Recommendations may be updated based on patient status, additional functional criteria and insurance authorization. Assessment: Orofacial Exam: No data recorded Anatomy: Anatomy: Prominent cricopharyngeus Boluses Administered: Boluses Administered Boluses Administered: Thin liquids (Level 0); Mildly thick liquids (Level 2, nectar thick); Moderately thick liquids (Level 3, honey thick); Puree; Solid  Oral Impairment Domain: Oral Impairment Domain Lip Closure: No labial escape Tongue control during bolus  hold: Not tested Bolus preparation/mastication: Timely and efficient chewing and mashing Bolus transport/lingual motion: Brisk tongue motion Oral residue: Complete oral clearance Location of oral residue : N/A Initiation of pharyngeal swallow : Valleculae; Pyriform sinuses  Pharyngeal Impairment Domain: Pharyngeal Impairment Domain Soft palate elevation: No bolus between soft palate (SP)/pharyngeal wall (PW) Laryngeal elevation: Partial superior movement  of thyroid  cartilage/partial approximation of arytenoids to epiglottic petiole Anterior hyoid excursion: Partial anterior movement Epiglottic movement: Complete inversion Laryngeal vestibule closure: Incomplete, narrow column air/contrast in laryngeal vestibule Pharyngeal stripping wave : Present - diminished Pharyngeal contraction (A/P view only): N/A Pharyngoesophageal segment opening: Complete distension and complete duration, no obstruction of flow Tongue base retraction: No contrast between tongue base and posterior pharyngeal wall (PPW) Pharyngeal residue: Collection of residue within or on pharyngeal structures Location of pharyngeal residue: Valleculae; Pyriform sinuses; Pharyngeal wall  Esophageal Impairment Domain: Esophageal Impairment Domain Esophageal clearance upright position: Complete clearance, esophageal coating Pill: Pill Consistency administered: Puree Puree: WFL Penetration/Aspiration Scale Score: Penetration/Aspiration Scale Score 1.  Material does not enter airway: Mildly thick liquids (Level 2, nectar thick); Moderately thick liquids (Level 3, honey thick); Puree; Solid 5.  Material enters airway, CONTACTS cords and not ejected out: Thin liquids (Level 0) 7.  Material enters airway, passes BELOW cords and not ejected out despite cough attempt by patient: Thin liquids (Level 0) Compensatory Strategies: Compensatory Strategies Compensatory strategies: Yes Chin tuck: Ineffective Ineffective Chin Tuck: Thin liquid (Level 0)   General Information: No data recorded Diet Prior to this Study: Regular; Thin liquids (Level 0)   Temperature : Normal   No data recorded  Supplemental O2: Nasal cannula   History of Recent Intubation: No  Behavior/Cognition: Alert; Cooperative; Pleasant mood Self-Feeding Abilities: Able to self-feed Baseline vocal quality/speech: Normal Volitional Cough: Able to elicit Volitional Swallow: Able to elicit Exam Limitations: No limitations Goal Planning: Prognosis for improved  oropharyngeal function: Good No data recorded No data recorded Patient/Family Stated Goal: plan is return to SNF Consulted and agree with results and recommendations: Patient Pain: Pain Assessment Pain Assessment: No/denies pain End of Session: Start Time:SLP Start Time (ACUTE ONLY): 1445 Stop Time: SLP Stop Time (ACUTE ONLY): 1505 Time Calculation:SLP Time Calculation (min) (ACUTE ONLY): 20 min Charges: SLP Evaluations $ SLP Speech Visit: 1 Visit SLP Evaluations $MBS Swallow: 1 Procedure SLP visit diagnosis: SLP Visit Diagnosis: Dysphagia, oropharyngeal phase (R13.12); Dysphagia, pharyngoesophageal phase (R13.14) Past Medical History: Past Medical History: Diagnosis Date . Age-related macular degeneration, dry, both eyes  . Allergic   "24/7; 365 days/year; I'm allergic to pollens, dust, all southern grasses/trees, mold, mildue, cats, dogs" (01/07/2017) . Anal fissure  . Asthma   sees Dr. McQuaid  . Benign prostatic hypertrophy   (sees Dr. Verdel Gitelman . CAD (coronary artery disease)   a. s/p CABG 2007. b. Cath 08/2016 - 4/4 patent grafts. . Carotid bruit   carotid u/s 10/10: 0.39% bilaterally . Chronic combined systolic and diastolic CHF (congestive heart failure) (HCC)  . Complication of anesthesia 1980s  "w/anal cyst OR, he gave me a saddle block then put a narcotic in spinal cord; had a severe reaction to that" (01/07/2017) . Congestive heart failure (CHF) (HCC)  . ED (erectile dysfunction)  . Family history of adverse reaction to anesthesia   "daughter wakes up during OR" (01/07/2017) . GERD (gastroesophageal reflux disease)  . Gout  . HTN (hypertension)  . Hx of colonic polyps   (sees Dr. Elvin Hammer) . Hyperlipidemia  . Hypothyroidism  . Moderate to severe aortic  stenosis   a. s/p TAVR 02/2017. Aaron Aas Myocardial infarction (HCC) ~ 2000 . Obesity  . Osteoarthritis   "was in my knees, hands" (01/07/2017 ) . PAF (paroxysmal atrial fibrillation) (HCC)   a. documented post TAVR. Aaron Aas Precancerous skin lesion   (sees Dr. Fleurette Humbles) .  Prostate cancer (HCC) dx'd ~ 2014 . 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 . 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 ANUS SURGERY    "opened it back up cause it wouldn't heal; wound up w/a fissure" (01/07/2017) . BIV ICD INSERTION CRT-D N/A 05/02/2017  Procedure: BIV ICD INSERTION CRT-D;  Surgeon: Verona Goodwill, MD;  Location: [facility] INVASIVE CV LAB;  Service: Cardiovascular;  Laterality: N/A; . CARDIAC CATHETERIZATION  10/29/2005 . CARDIAC CATHETERIZATION N/A 08/27/2016  Procedure: Right/Left Heart Cath and Coronary/Graft Angiography;  Surgeon: Odie Benne, MD;  Location: [facility] INVASIVE CV LAB;  Service: Cardiovascular;  Laterality: N/A; . CARDIOVERSION N/A 04/24/2021  Procedure: CARDIOVERSION;  Surgeon: Elmyra Haggard, MD;  Location: [facility] ENDOSCOPY;  Service: Cardiovascular;  Laterality: N/A; . CARDIOVERSION N/A 11/08/2021  Procedure: CARDIOVERSION;  Surgeon: Hazle Lites, MD;  Location: [facility] ENDOSCOPY;  Service: Cardiovascular;  Laterality: N/A; . CARDIOVERSION N/A 02/06/2022  Procedure: CARDIOVERSION;  Surgeon: Loyde Rule, MD;  Location: [facility] ENDOSCOPY;  Service: Cardiovascular;  Laterality: N/A; . CATARACT EXTRACTION W/ INTRAOCULAR LENS  IMPLANT, BILATERAL Bilateral  . CATARACT EXTRACTION, BILATERAL  2012 . COLONOSCOPY  06/30/2008  no repeats needed  . COLONOSCOPY    had 3 or 4 in the past  . CORONARY ARTERY BYPASS GRAFT  2007  "CABG X4" . CYST EXCISION PERINEAL  1980s . HAMMER TOE SURGERY Bilateral  . ICD GENERATOR CHANGEOUT N/A 05/30/2023  Procedure: ICD GENERATOR CHANGEOUT;  Surgeon: Verona Goodwill, MD;  Location: [facility] INVASIVE CV LAB;  Service: Cardiovascular;  Laterality: N/A; . JOINT REPLACEMENT   . KNEE ARTHROPLASTY  07/30/2011  Procedure: COMPUTER ASSISTED TOTAL KNEE ARTHROPLASTY;  Surgeon: Jasmine Mesi;  Location: [facility] OR;   Service: Orthopedics;  Laterality: Left;  left total knee arthroplasty . MASTECTOMY SUBCUTANEOUS Bilateral  . MULTIPLE TOOTH EXTRACTIONS   . ORIF FINGER / THUMB FRACTURE Right ~ 1980  "repair of thumb injury" . PROSTATE BIOPSY   . REPLACEMENT TOTAL KNEE BILATERAL Bilateral 2012 . TEE WITHOUT CARDIOVERSION N/A 03/04/2017  Procedure: TRANSESOPHAGEAL ECHOCARDIOGRAM (TEE);  Surgeon: Odie Benne, MD;  Location: [facility] OR;  Service: Open Heart Surgery;  Laterality: N/A; . TOTAL KNEE REVISION Right 05/18/2019  Procedure: RIGHT PATELLA REVISION/REMOVAL;  Surgeon: Jasmine Mesi, MD;  Location: [facility] OR;  Service: Orthopedics;  Laterality: Right; . TRANSCATHETER AORTIC VALVE REPLACEMENT, TRANSFEMORAL N/A 03/04/2017  Procedure: TRANSCATHETER AORTIC VALVE REPLACEMENT, TRANSFEMORAL;  Surgeon: Odie Benne, MD;  Location: [facility] OR;  Service: Open Heart Surgery;  Laterality: N/A; Jacqualine Mater, MA, CCC-SLP Speech Therapy    Assessment/Plan: UTI (urinary tract infection) urine culture 12/16/23 showed ESBL, susceptible to Nitrofurantoin, Ceftazidime, Cefepime. The patient is in his usual stated of health, he is afebrile.  C/o burning sensation and urinary frequency. Denied abd or lower back pain.  Will try Nitrofurantoin 100mg  bid x 7 days.   Pleural effusion 12/15/23 CXR mild CHF, no gross focal lund consolidation, pleural effusion, pneumothorax  Leukocytosis  wbc 20.4 11/27/23<<9.7 12/16/23  GOUT no flare up over a year, R+L wrist, only takes Allopurinol , uric acid  7.6 10/10/22  Paroxysmal atrial fibrillation (HCC) s/p pace maker, defibrillator , on Amiodarone , Pradaxa , Metoprolol , followed by cardiology.   Hyperlipidemia on ASA, Atorvastatin , LDL 55 10/14/23  CAD, AUTOLOGOUS BYPASS GRAFT Hx of CABF, TAVR, no chest pain, taking Isosorbide   Chronic congestive heart failure (HCC) mild edema BLE, RLE>LLE, off Farxiga  due to recurrent UTI, on Metoprolol , Metolazone , Spironolactone ,  Torsemide   Hypothyroidism on Levothyroxine , TSH 3.27 10/14/23  Essential hypertension blood pressure is controlled, on Losartan , Metoprolol , diuretics.   Gastroesophageal reflux disease stable, on Omeprazole , Hx of Reglan  use, Hgb 12.8 12/16/23  Recurrent depression (HCC) better with Effexor , failed Zoloft , feels sleeps too much and depressed.   Chronic kidney disease, stage 3a (HCC)  Bun/creat 25/1.29 12/16/23  Type 2 diabetes mellitus (HCC) Hgb A1c 6.8 10/14/23, on insulin     Family/ staff Communication: plan of care reviewed with the patient and charge nurse.   Labs/tests ordered:  none

## 2023-12-22 ENCOUNTER — Telehealth (HOSPITAL_COMMUNITY): Payer: Self-pay

## 2023-12-22 DIAGNOSIS — R1312 Dysphagia, oropharyngeal phase: Secondary | ICD-10-CM | POA: Diagnosis not present

## 2023-12-22 DIAGNOSIS — M6281 Muscle weakness (generalized): Secondary | ICD-10-CM | POA: Diagnosis not present

## 2023-12-22 NOTE — Progress Notes (Signed)
 ADVANCED HEART FAILURE CLINIC NOTE  Primary Care: Mast, Man X, NP Primary Cardiologist: Dr. Abel Hoe EP: Dr. Rodolfo Clan HF Cardiologist: Dr. Bruce Caper  HPI: Justin [name] is a 89 y.o. male with permanent atrial fibrillation, aortic stenosis s/p TAVR, medtronic BiV ICD in 2018 and after TAVR, CKD, CAD s/p CABG in 2007 presenting today to due to shortness of breath.   Interval hx:  Justin [name] states he is currently in the process of moving. He become quickly short of breath. He can walk roughly 10-50ft before becoming short of breath. During this time he often has to take a break which leads to quick improvement in his symptoms. Over the past several months he feels that these symptoms have progressed. He now feels short of breath with ADLs. He feels much better when he is well diuresed, however, struggles with rises in sCr at that time.   - Has followed with Dr. Abel Hoe and Dr. Rodolfo Clan. S/p ICD generator changed 10/24. EF 40-45% at the time. Enrolled in iCM monitoring.    He returns today for heart failure follow up. Overall feeling ***. NYHA ***. Reports {Symptoms; cardiac:12860::"dyspnea","fatigue"}. Denies {Symptoms; cardiac:12860::"chest pain","dyspnea","fatigue","near-syncope","orthopnea","palpitations","dizziness","abnormal bleeding"}. Able to perform ADLs. Appetite okay. Weight at home ***. BP at home***. Compliant with all medications.  SH: Lives in senior living facility with wife  Past Medical History:  Diagnosis Date   Age-related macular degeneration, dry, both eyes    Allergic    "24/7; 365 days/year; I'm allergic to pollens, dust, all southern grasses/trees, mold, mildue, cats, dogs" (01/07/2017)   Anal fissure    Asthma    sees Dr. Elverna Hamman    Benign prostatic hypertrophy    (sees Dr. Verdel Gitelman   CAD (coronary artery disease)    a. s/p CABG 2007. b. Cath 08/2016 - 4/4 patent grafts.   Carotid bruit    carotid u/s 10/10: 0.39% bilaterally   Chronic combined systolic and  diastolic CHF (congestive heart failure) (HCC)    Complication of anesthesia 1980s   "w/anal cyst OR, he gave me a saddle block then put a narcotic in spinal cord; had a severe reaction to that" (01/07/2017)   Congestive heart failure (CHF) [facility])    ED (erectile dysfunction)    Family history of adverse reaction to anesthesia    "daughter wakes up during OR" (01/07/2017)   GERD (gastroesophageal reflux disease)    Gout    HTN (hypertension)    Hx of colonic polyps    (sees Dr. Elvin Hammer)   Hyperlipidemia    Hypothyroidism    Moderate to severe aortic stenosis    a. s/p TAVR 02/2017.   Myocardial infarction [facility]) ~ 2000   Obesity    Osteoarthritis    "was in my knees, hands" (01/07/2017 )   PAF (paroxysmal atrial fibrillation) (HCC)    a. documented post TAVR.   Precancerous skin lesion    (sees Dr. 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 8 g 5   allopurinol  (ZYLOPRIM ) 100 MG tablet Take 1 tablet (100 mg total) by mouth daily. 90 tablet 3  amiodarone  (PACERONE ) 200 MG tablet Take 1 tablet (200 mg total) by mouth daily.     aspirin  81 MG tablet Take 1 tablet (81 mg total) by mouth daily. 30 tablet 0   atorvastatin  (LIPITOR) 20 MG tablet TAKE ONE TABLET ONCE DAILY 90 tablet 2   cetirizine (ZYRTEC) 10 MG tablet Take 10 mg by mouth 2 (two) times daily.     colchicine  0.6 MG tablet Take 1 tablet (0.6 mg total) by mouth every 6 (six) hours as needed (gout). 60 tablet 5   Continuous Glucose Receiver (DEXCOM G6 RECEIVER) DEVI 1 Device by Does not apply route 3 (three) times daily. E11.69  1 each 11   Continuous Glucose Sensor (DEXCOM G6 SENSOR) MISC 1 Device by Does not apply route 3 (three) times daily. E11.69 3 each 11   Continuous Glucose Transmitter (DEXCOM G6 TRANSMITTER) MISC 1 Device by Does not apply route 3 (three) times daily. E11.69 1 each 11   dabigatran  (PRADAXA ) 150 MG CAPS capsule TAKE (1) CAPSULE TWICE DAILY. 180 capsule 2   insulin  aspart (NOVOLOG ) 100 UNIT/ML injection Inject 4 Units into the skin 3 (three) times daily before meals. Inject as per sliding scale: if 141 - 180 = 4 units Inject per sliding scale; 181 - 220 = 6 units Inject per sliding scale; 221 - 260 = 8 units Inject per sliding scale; 261 - 300 = 10 units Inject per sliding scale; 301 - 350 = 12 units Inject per sliding scale; 351 - 400= 14 units Inject per sliding scale; 401+ = 16 units Inject per sliding scale, subcutaneously three times a day     isosorbide  mononitrate (IMDUR ) 30 MG 24 hr tablet Take 1 tablet (30 mg total) by mouth daily. 90 tablet 3   ketotifen  (ZADITOR ) 0.025 % ophthalmic solution Place 1 drop into both eyes daily as needed (for itching).     levothyroxine  (SYNTHROID ) 175 MCG tablet Take 1 tablet (175 mcg total) by mouth daily before breakfast.     losartan  (COZAAR ) 25 MG tablet Take 0.5 tablets (12.5 mg total) by mouth daily. 30 tablet 11   magic mouthwash SOLN Take 5 mLs by mouth as directed. Suspension contains equal amounts of Maalox Extra Strength, nystatin, and diphenhydramine . RX'ed by dentist (Patient not taking: Reported on 11/28/2023)     methocarbamol  (ROBAXIN ) 500 MG tablet Take 1 tablet (500 mg total) by mouth 4 (four) times daily as needed for muscle spasms. 50 tablet 0   metoCLOPramide  (REGLAN ) 10 MG tablet TAKE ONE TABLET BY MOUTH EVERY MORNING WITH BREAKFAST 30 tablet 3   metolazone  (ZAROXOLYN ) 2.5 MG tablet Take 1 tablet (2.5 mg total) by mouth once a week every Saturday (Patient not taking: Reported on 11/28/2023) 12 tablet 3   metoprolol  succinate (TOPROL -XL) 50 MG 24  hr tablet Take 1 tablet (50 mg total) by mouth daily. Take with or immediately following a meal.     montelukast  (SINGULAIR ) 10 MG tablet TAKE ONE TABLET BY MOUTH ONCE DAILY IN THE EVENING 90 tablet 3   Multiple Minerals-Vitamins (CAL MAG ZINC +D3 PO) Take 2 tablets by mouth in the morning. 500 mg / 7 mg / 25 mcg     Multiple Vitamins-Minerals (MULTI-VITAMIN GUMMIES) CHEW Chew 2 tablets by mouth See admin instructions. Chew 2 gummies by mouth in the morning     Multiple Vitamins-Minerals (PRESERVISION AREDS 2) CAPS Take 1 capsule by mouth in the morning and at bedtime.     pantoprazole  (PROTONIX ) 40 MG tablet  Take 1 tablet (40 mg total) by mouth daily. 30 tablet 3   potassium chloride  SA (KLOR-CON  M) 20 MEQ tablet Take 1 tablet (20 mEq total) by mouth 2 (two) times daily. 180 tablet 3   predniSONE  (DELTASONE ) 10 MG tablet Take 2 tabs daily x 5 days then 1 tab daily x 5 days then stop     silver  sulfADIAZINE  (SILVADENE ) 1 % cream Apply 1 Application topically 2 (two) times daily. 50 g 5   spironolactone  (ALDACTONE ) 25 MG tablet Take 1 tablet (25 mg total) by mouth daily. 30 tablet 9   torsemide  (DEMADEX ) 20 MG tablet Take 1 tablet (20 mg total) by mouth daily.     triamcinolone  cream (KENALOG ) 0.1 % Apply 1 application  topically daily as needed for dry skin (affected areas).     valACYclovir  (VALTREX ) 500 MG tablet Take 500 mg by mouth 2 (two) times daily as needed (cold sores).     venlafaxine  XR (EFFEXOR  XR) 37.5 MG 24 hr capsule Take 1 capsule (37.5 mg total) by mouth daily with breakfast. 30 capsule 1   No current facility-administered medications for this visit.    Allergies  Allergen Reactions   Peanut-Containing Drug Products Anaphylaxis   Sulfonamide Derivatives Anaphylaxis   Amlodipine  Swelling and Other (See Comments)    Swelling in ankles   Eliquis  [Apixaban ] Other (See Comments)    Back/hip pain   Lisinopril  Cough   Xarelto  [Rivaroxaban ] Other (See Comments)    Back/hip pain       Social History   Socioeconomic History   Marital status: Married    Spouse name: Not on file   Number of children: 3   Years of education: Not on file   Highest education level: Not on file  Occupational History   Occupation: Product/process development scientist    Comment: builds malls  Tobacco Use   Smoking status: Former    Current packs/day: 0.00    Average packs/day: 3.5 packs/day for 13.0 years (45.5 ttl pk-yrs)    Types: Cigarettes    Start date: 50    Quit date: 1963    Years since quitting: 62.3   Smokeless tobacco: Never   Tobacco comments:    Former smoker 04/19/21  Vaping Use   Vaping status: Never Used  Substance and Sexual Activity   Alcohol use: Not Currently    Alcohol/week: 7.0 standard drinks of alcohol    Types: 7 Cans of beer per week    Comment: 1 beer daily after 5pm (04/19/21)   Drug use: No   Sexual activity: Never  Other Topics Concern   Not on file  Social History Narrative   FH of CAD, Male 1st degree relative less than age 64.   Social Drivers of Corporate investment banker Strain: Low Risk  (03/31/2023)   Overall Financial Resource Strain (CARDIA)    Difficulty of Paying Living Expenses: Not very hard  Food Insecurity: No Food Insecurity (11/18/2023)   Hunger Vital Sign    Worried About Running Out of Food in the Last Year: Never true    Ran Out of Food in the Last Year: Never true  Transportation Needs: No Transportation Needs (11/18/2023)   PRAPARE - Administrator, Civil Service (Medical): No    Lack of Transportation (Non-Medical): No  Physical Activity: Sufficiently Active (03/31/2023)   Exercise Vital Sign    Days of Exercise per Week: 7 days    Minutes of Exercise per Session: 30 min  Stress: No Stress Concern Present (03/31/2023)   [employer] of Occupational Health - Occupational Stress Questionnaire    Feeling of Stress : Not at all  Social Connections: Socially Integrated (11/18/2023)   Social Connection and Isolation  Panel [NHANES]    Frequency of Communication with Friends and Family: Three times a week    Frequency of Social Gatherings with Friends and Family: Three times a week    Attends Religious Services: More than 4 times per year    Active Member of Clubs or Organizations: Yes    Attends Banker Meetings: More than 4 times per year    Marital Status: Married  Catering manager Violence: Not At Risk (11/17/2023)   Humiliation, Afraid, Rape, and Kick questionnaire    Fear of Current or Ex-Partner: No    Emotionally Abused: No    Physically Abused: No    Sexually Abused: No      Family History  Problem Relation Age of Onset   Heart attack Father 32   Allergic rhinitis Father    Asthma Father    Heart failure Mother 51   Uterine cancer Mother    Breast cancer Mother    Colon cancer Neg Hx    Esophageal cancer Neg Hx     PHYSICAL EXAM: There were no vitals filed for this visit.  There were no vitals filed for this visit.  General: Well appearing. No distress on RA Cardiac: JVP ***. S1 and S2 present. No murmurs or rub. Resp: Lung sounds clear and equal B/L Abdomen: Soft, non-tender, non-distended.  Extremities: Warm and dry.  *** edema.  Neuro: Alert and oriented x3. Affect pleasant. Moves all extremities without difficulty.  DATA REVIEW  ECG: AF 91 bpm (reviewed from 11/21/23)  ECHO: 11/12/22: LVEF 45-50%, RV function low normal.   CATH: LHC/RHC: 08/2016:  There is moderate aortic valve stenosis. SVG graft was visualized by angiography and is normal in caliber. 1st Mrg lesion, 65 %stenosed. Ost LM to LM lesion, 100 %stenosed. SVG graft was visualized by angiography and is normal in caliber. LIMA graft was visualized by angiography and is normal in caliber. Prox RCA to Mid RCA lesion, 100 %stenosed. SVG graft was visualized by angiography and is normal in caliber and anatomically normal. Hemodynamic findings consistent with mild pulmonary hypertension.   1.  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  2. The Left main is occluded at the ostium.  3. The LAD fills from the patent IMA graft and the Diagonal fills from the patent vein graft 4. The Circumflex fills from the patent vein graft to the OM. There is a moderate stenosis in the proximal segment of the OM branch proximal to the insertion of the vein graft.  5. The distal RCA fills from the patent vein graft.  6. Moderate AS 7. 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  NYHA class / AHA Stage: IIb-III Volume status & Diuretics:  Euvolemic, REDS 28. Continue torsemide  40mg  daily with metolazone  2.5 mg twice weekly Vasodilators: Continue Imdur  30mg , add entresto  at follow up.  Beta-Blocker: Continue Toprol -XL 75mg  daily MRA: Continue Spironolactone  25mg  daily Cardiometabolic: Continue farxiga  10mg  daily Devices therapies & Valvulopathies: CRT-D Advanced therapies: not a candidate  2. Atrial fibrillation & frequent PVCs  - Follow with EP, enrolled in iCM -  Continue amio 200 mg daily and pradaxa   3. CAD s/p CABG  - CABG in 2007; LHC in 2018  4. Aortic stenosis s/p TAVR - CRT-D placed due to syncope and TAVR  5. Hypothyroidism - synthroid   Follow up in *** with ***  [place] Mykiah Schmuck, NP 12/22/23

## 2023-12-22 NOTE — Progress Notes (Unsigned)
 Cardiology Office Note    Patient Name: Justin [name] Date of Encounter: 12/22/2023  Primary Care Provider:  Mast, Man X, NP Primary Cardiologist:  Justin Batman, MD Primary Electrophysiologist: Justin Chandler, MD   Past Medical History    Past Medical History:  Diagnosis Date   Age-related macular degeneration, dry, both eyes    Allergic    "24/7; 365 days/year; I'm allergic to pollens, dust, all southern grasses/trees, mold, mildue, cats, dogs" (01/07/2017)   Anal fissure    Asthma    sees Justin [name]    Benign prostatic hypertrophy    (sees Justin [name]   CAD (coronary artery disease)    a. s/p CABG 2007. b. Cath 08/2016 - 4/4 patent grafts.   Carotid bruit    carotid u/s 10/10: 0.39% bilaterally   Chronic combined systolic and diastolic CHF (congestive heart failure) (HCC)    Complication of anesthesia 1980s   "w/anal cyst OR, he gave me a saddle block then put a narcotic in spinal cord; had a severe reaction to that" (01/07/2017)   Congestive heart failure (CHF) [facility])    ED (erectile dysfunction)    Family history of adverse reaction to anesthesia    "daughter wakes up during OR" (01/07/2017)   GERD (gastroesophageal reflux disease)    Gout    HTN (hypertension)    Hx of colonic polyps    (sees Justin [name])   Hyperlipidemia    Hypothyroidism    Moderate to severe aortic stenosis    a. s/p TAVR 02/2017.   Myocardial infarction [facility]) ~ 2000   Obesity    Osteoarthritis    "was in my knees, hands" (01/07/2017 )   PAF (paroxysmal atrial fibrillation) (HCC)    a. documented post TAVR.   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 male with a PMH of CAD s/p CABG [name] 4 in 10/2005  (LIMA-LAD, SVG-D, SVG-OM, SVG-RCA), severe aortic stenosis s/p TAVR 2018, Permanent Afib, chronic HFmrEF with stable EF 40-45%, LBBB, PVCs, CRT-D 2018 (gen change 2024), CKD, HTN, HLD, DM2, hypothyroidism, BPH who presents today for 1 month follow-up  Justin [name] was previously followed by Justin [name] and is currently managed by Dr. Abel [name] for cardiac care.  He has a past cardiac history of CABG [name] 4 in 2007.  Completed carotid ultrasound in 2010 that showed bilateral (0-39%) and nuclear stress test in 2011 that showed inferior septal and apical defect consistent with scar with no evidence of ischemia.  He was seen initially by Dr. Abel [name] in 2013 and reported doing well with no changes in therapy.  He was seen in follow-up on 07/2016 with complaint of exertional chest pain and shortness of breath.  He underwent a R/LHC due to history of aortic stenosis that showed moderate aortic stenosis and elevated filling pressures and patent bypass grafts.  He was started on Lasix  daily and felt improvement with less lower extremity swelling.  He was also treated with Imdur .  He was seen in 12/2016 with continuous lower extremity edema and shortness of breath with minimal exertion.  He was admitted directly to [facility] from the office with brief diuresis of 5.5 L  over 4 days.  He was admitted on 03/04/2017 and underwent TAVR with Edwards Sapien 3 THV (size 29mm).  Follow-up postop AF with RVR was treated with IV amiodarone  with subsequent conversion to sinus rhythm.  He was placed on Xarelto  with discontinuation of Plavix  and discharged on aspirin  and Xarelto .  He was seen in follow-up and noted to have irregular rhythm with recommendations for event monitor that showed junctional rhythm with frequent PVCs and wide-complex tachycardia.  He was referred to EP for further evaluation with recommendation to undergo CRT ICD placement on 04/2017.  2D echo was completed on 04/2019 showing EF of 35 as 40% losartan  was switched to  Entresto .  He underwent device interrogations on 03/2021 that showed persistent AF.  Patient was referred to AF clinic with plans for DCCV which was completed on 04/24/2021.  He was seen in follow-up and reported less episodes of tachycardia but ongoing shortness of breath.  He underwent device interrogation that showed BiV pacing decreased AF burden increased from 5 to 99%.  He was evaluated by Dr. Rodolfo [name] who recommended DCCV which was completed on 11/08/2021.  And metolazone  was decreased to 2.5 mg once daily due to dehydration.  He was started on amiodarone  following repeat DCCV on 02/06/2022.  He was seen by Dr. Abel [name] and metolazone  was continued at 2 days a week with increase of Lasix  to 80 mg twice daily [name] 3 days then back to once daily. He underwent generator change out in 05/2023.  He was admitted to [facility] on 06/30/2023 for volume overload and diuresed 4 L with improvement.  He saw Dr. Abel [name] for hospital follow-up and patient denied any chest pain and was feeling much better.  He was advised to follow-up in the advanced heart failure clinic in 3 months.  He was last seen by Dr. Rodolfo [name] on 10/24/2023 and endorsed fatigue with some swelling but improvement.  He was admitted on 11/17/2023 with worsening shortness of breath and fatigue.  He was found to be hypoxic in the 70s with chest [name]-ray concerning for multilobar PNA.  BNP was completed and was 880 and patient was positive for COVID.  He required BiPAP and transferred to the ICU.  TTE was completed showing EF of 40-45% with mildly decreased LV function and moderately enlarged RV with severely dilated LA/RA and large pleural effusion.  He went thoracentesis with improvement to respiratory status with amiodarone  initially held due to possible toxicity but resumed due to improvement after thoracentesis.  He lost a net total of 9 L with admission.  He was discharged to SNF due to deconditioning and was seen most recently by PCP with UTI and underwent treatment  with antibiotics.  Mr. Divan presents today for hospital follow-up with his daughter. He has been experiencing persistent shortness of breath and fatigue since his recent discharge from the hospital. The shortness of breath is described as 'quick to happen and quick to go away,' with rapid recovery. No chest pain is noted during or after his hospital stay. Fatigue is also described as 'fairly quick to happen and fairly quick to go away.  He is on medications including Lasix , Entresto , and metolazone , with recent adjustments made by the AHF clinic.  He is currently residing in a skilled nursing facility due to deconditioning, he is undergoing physical, occupational, and swallowing therapy. Significant weakness, particularly in his legs, and difficulty with balance are reported, requiring assistance with activities such as getting into a car. Thirst has decreased since hospital  discharge, and excessive thirst experienced prior to hospitalization is no longer present. He has a history of UTIs, with the most recent treated with antibiotics. A history of diabetes, exacerbated by steroid use during recent hospitalization, is noted, but fast-acting insulin  has not been required in the past few days. He is on a regimen of multiple medications including torsemide , metolazone , and Entresto , with recent changes to his diuretic therapy. He drinks approximately 60-70 ounces of fluid daily and denies dizziness when moving from sitting to standing. Blood pressure has been low, with a recent reading of 100/42, and 98/48 on recheck.  But no dizziness or lightheadedness is experienced. A history of macular degeneration affects his vision, making it difficult to see faces and read text on screens. Vision is described as 'fuzzy,' but he is able to see the whole room and larger objects.  Discussed the use of AI scribe software for clinical note transcription with the patient, who gave verbal consent to proceed.  History of  Present Illness   Review of Systems  Please see the history of present illness.    All other systems reviewed and are otherwise negative except as noted above.  Physical Exam    Wt Readings from Last 3 Encounters:  12/19/23 245 lb 12.8 oz (111.5 kg)  12/15/23 247 lb 3.2 oz (112.1 kg)  11/28/23 242 lb (109.8 kg)   NW:GNFAO were no vitals filed for this visit.,There is no height or weight on file to calculate BMI. GEN: Well nourished, well developed in no acute distress Neck: No JVD; No carotid bruits Pulmonary: Clear to auscultation without rales, wheezing or rhonchi  Cardiovascular: Normal rate. Regular rhythm. Normal S1. Normal S2.   Murmurs: There is no murmur.  ABDOMEN: Soft, non-tender, non-distended EXTREMITIES: +1 trace lower extremity edema EKG/LABS/ Recent Cardiac Studies   ECG personally reviewed by me today -none completed today  Risk Assessment/Calculations:    CHA2DS2-VASc Score = 6   This indicates a 9.7% annual risk of stroke. The patient's score is based upon: CHF History: 1 HTN History: 1 Diabetes History: 1 Stroke History: 0 Vascular Disease History: 1 Age Score: 2 Gender Score: 0         Lab Results  Component Value Date   WBC 20.4 (H) 11/27/2023   HGB 13.3 11/27/2023   HCT 41.9 11/27/2023   MCV 84.5 11/27/2023   PLT 352 11/27/2023   Lab Results  Component Value Date   CREATININE 1.32 (H) 11/27/2023   BUN 46 (H) 11/27/2023   NA 134 (L) 11/27/2023   K 3.8 11/27/2023   CL 96 (L) 11/27/2023   CO2 29 11/27/2023   Lab Results  Component Value Date   CHOL 108 10/14/2023   HDL 33 (L) 10/14/2023   LDLCALC 55 10/14/2023   TRIG 113 10/14/2023   CHOLHDL 3.3 10/14/2023    Lab Results  Component Value Date   HGBA1C 6.8 (H) 10/14/2023   Assessment & Plan    1.  Chronic combined CHF: - Patient recently admitted with hypoxia secondary to COVID PNA with large left pleural effusion and 1000 cc removed and improvement to oxygenation. - Today  patient reports ongoing shortness of breath and fatigue but denies any chest pain. - He is also noted to have some hypotension but is asymptomatic -Hypotension likely due to diuretic therapy. BNP levels indicate fluid volume imbalance. Recent medication adjustments include increased torsemide  and initiation of Entresto  12/13 mg twice daily -Continue Zaroxolyn  2.5 mg once weekly, K-Dur 20 milliequivalents  twice daily, spironolactone  25 mg, - Ensure adequate fluid intake to prevent dehydration. - Monitor blood pressure regularly. - Perform daily weights. - Patient is scheduled to follow-up with advanced heart failure clinic in 1 month  2.  CAD: -s/p CABG [name] 4 in 2007 with last ischemic evaluation completed in 2018 - Patient reports no chest pain or angina since discharge. - Continue current GDMT with ASA 81 mg, Lipitor 20 mg, Imdur  30 mg, Toprol -XL 50 mg  3.  Permanent AF: - Patient currently on amiodarone  200 mg daily with controlled rate -Continue Toprol -XL 50 mg daily - Patient reports no bleeding issues and will continue Pradaxa  150 mg daily   4.  CKD stage IIIa: - Patient's last creatinine was elevated at 1.5 - Patient advised to stay hydrated and continue to monitor renal function in the context of diuretic therapy and heart failure management.  5.  History of aortic stenosis: -s/p TAVR 2018 with no current symptoms and last 2D echo completed 11/18/2023 showing stable prosthesis and gradient. - Continue routine follow-up and monitoring.  6.  History of NSVT: -s/p BiV ICD with no inappropriate shocks and normal histograms. - Continue Toprol -XL 50 mg daily  Disposition: Follow-up with Justin Batman, MD or APP in 1 months    Signed, Francene Ing, Retha Cast, NP 12/22/2023, 1:15 PM [facility]

## 2023-12-22 NOTE — Telephone Encounter (Signed)
 Called and spoke to pt and per pt's request lvm for his daughter Justin [name] for to confirm/remind patient of their appointment at the Advanced Heart Failure Clinic on 12/23/23.   Patient is not sure if he will be able to make it. However, a voice message was left on pt's daughter Tamara's phone to give our office a call at [phone] and press option # 3 to scheduled/reschedule.    Patient reminded to bring all medications and/or complete list.  Confirmed patient has transportation. Gave directions, instructed to utilize valet parking.

## 2023-12-23 ENCOUNTER — Encounter: Payer: Self-pay | Admitting: Nurse Practitioner

## 2023-12-23 ENCOUNTER — Other Ambulatory Visit (HOSPITAL_COMMUNITY): Payer: Self-pay

## 2023-12-23 ENCOUNTER — Ambulatory Visit (INDEPENDENT_AMBULATORY_CARE_PROVIDER_SITE_OTHER): Admitting: Nurse Practitioner

## 2023-12-23 ENCOUNTER — Encounter (HOSPITAL_COMMUNITY): Payer: Self-pay

## 2023-12-23 ENCOUNTER — Telehealth (HOSPITAL_COMMUNITY): Payer: Self-pay

## 2023-12-23 ENCOUNTER — Ambulatory Visit
Admission: RE | Admit: 2023-12-23 | Discharge: 2023-12-23 | Disposition: A | Discharge status: Home / Self Care | Source: Ambulatory Visit | Attending: Cardiology | Admitting: Cardiology

## 2023-12-23 VITALS — BP 122/64 | HR 76 | Wt 246.8 lb

## 2023-12-23 VITALS — BP 100/42 | HR 91 | Ht 66.5 in | Wt 247.0 lb

## 2023-12-23 DIAGNOSIS — Z79890 Hormone replacement therapy (postmenopausal): Secondary | ICD-10-CM | POA: Insufficient documentation

## 2023-12-23 DIAGNOSIS — Z7901 Long term (current) use of anticoagulants: Secondary | ICD-10-CM | POA: Insufficient documentation

## 2023-12-23 DIAGNOSIS — I130 Hypertensive heart and chronic kidney disease with heart failure and stage 1 through stage 4 chronic kidney disease, or unspecified chronic kidney disease: Secondary | ICD-10-CM | POA: Insufficient documentation

## 2023-12-23 DIAGNOSIS — I4729 Other ventricular tachycardia: Secondary | ICD-10-CM | POA: Diagnosis not present

## 2023-12-23 DIAGNOSIS — E039 Hypothyroidism, unspecified: Secondary | ICD-10-CM | POA: Diagnosis not present

## 2023-12-23 DIAGNOSIS — I5042 Chronic combined systolic (congestive) and diastolic (congestive) heart failure: Secondary | ICD-10-CM

## 2023-12-23 DIAGNOSIS — Z794 Long term (current) use of insulin: Secondary | ICD-10-CM | POA: Insufficient documentation

## 2023-12-23 DIAGNOSIS — Z87891 Personal history of nicotine dependence: Secondary | ICD-10-CM | POA: Insufficient documentation

## 2023-12-23 DIAGNOSIS — I5022 Chronic systolic (congestive) heart failure: Secondary | ICD-10-CM | POA: Insufficient documentation

## 2023-12-23 DIAGNOSIS — I350 Nonrheumatic aortic (valve) stenosis: Secondary | ICD-10-CM | POA: Diagnosis not present

## 2023-12-23 DIAGNOSIS — N1831 Chronic kidney disease, stage 3a: Secondary | ICD-10-CM | POA: Diagnosis not present

## 2023-12-23 DIAGNOSIS — I493 Ventricular premature depolarization: Secondary | ICD-10-CM | POA: Insufficient documentation

## 2023-12-23 DIAGNOSIS — N400 Enlarged prostate without lower urinary tract symptoms: Secondary | ICD-10-CM | POA: Insufficient documentation

## 2023-12-23 DIAGNOSIS — Z952 Presence of prosthetic heart valve: Secondary | ICD-10-CM | POA: Diagnosis not present

## 2023-12-23 DIAGNOSIS — R1312 Dysphagia, oropharyngeal phase: Secondary | ICD-10-CM | POA: Diagnosis not present

## 2023-12-23 DIAGNOSIS — I2510 Atherosclerotic heart disease of native coronary artery without angina pectoris: Secondary | ICD-10-CM | POA: Diagnosis not present

## 2023-12-23 DIAGNOSIS — Z951 Presence of aortocoronary bypass graft: Secondary | ICD-10-CM | POA: Insufficient documentation

## 2023-12-23 DIAGNOSIS — E1122 Type 2 diabetes mellitus with diabetic chronic kidney disease: Secondary | ICD-10-CM | POA: Insufficient documentation

## 2023-12-23 DIAGNOSIS — I4821 Permanent atrial fibrillation: Secondary | ICD-10-CM

## 2023-12-23 DIAGNOSIS — R0600 Dyspnea, unspecified: Secondary | ICD-10-CM

## 2023-12-23 DIAGNOSIS — M6281 Muscle weakness (generalized): Secondary | ICD-10-CM | POA: Diagnosis not present

## 2023-12-23 DIAGNOSIS — Z95810 Presence of automatic (implantable) cardiac defibrillator: Secondary | ICD-10-CM | POA: Diagnosis not present

## 2023-12-23 DIAGNOSIS — Z79899 Other long term (current) drug therapy: Secondary | ICD-10-CM | POA: Diagnosis not present

## 2023-12-23 DIAGNOSIS — R0602 Shortness of breath: Secondary | ICD-10-CM | POA: Diagnosis present

## 2023-12-23 LAB — BASIC METABOLIC PANEL WITH GFR
Anion gap: 13 (ref 5–15)
BUN: 20 mg/dL (ref 8–23)
CO2: 26 mmol/L (ref 22–32)
Calcium: 8.1 mg/dL — ABNORMAL LOW (ref 8.9–10.3)
Chloride: 93 mmol/L — ABNORMAL LOW (ref 98–111)
Creatinine, Ser: 1.58 mg/dL — ABNORMAL HIGH (ref 0.61–1.24)
GFR, Estimated: 42 mL/min — ABNORMAL LOW (ref >60)
Glucose, Bld: 92 mg/dL (ref 70–99)
Potassium: 3.7 mmol/L (ref 3.5–5.1)
Sodium: 132 mmol/L — ABNORMAL LOW (ref 135–145)

## 2023-12-23 LAB — BRAIN NATRIURETIC PEPTIDE: B Natriuretic Peptide: 579.7 pg/mL — ABNORMAL HIGH (ref 0.0–100.0)

## 2023-12-23 MED ORDER — ENTRESTO 24-26 MG PO TABS: ORAL_TABLET | ORAL | Status: DC

## 2023-12-23 MED ORDER — TORSEMIDE 20 MG PO TABS: 40.0000 mg | ORAL_TABLET | Freq: Every day | ORAL | Status: DC

## 2023-12-23 NOTE — Patient Instructions (Addendum)
 Changes:  STOP LOSARTAN    START ENTRESTO  12/13MG  TWICE DAILY (1/2) TABLET TWICE DAILY   INCREASE TORSEMIDE  TO 40MG  ONCE DAILY   Lab Work:  Labs done today, your results will be available in MyChart, we will contact you for abnormal readings.  THEN LABS AGAIN IN 7-10 DAYS AT THE FACILITY   Testing/Procedures:  Your physician has recommended that you have a pulmonary function test. Pulmonary Function Tests are a group of tests that measure how well air moves in and out of your lungs. ONCE APPROVED WITH INSURANCE SOMEONE WILL REACH OUT TO GET THIS SCHEDULED   Follow-Up in: AS SCHEDULED   At the Advanced Heart Failure Clinic, you and your health needs are our priority. We have a designated team specialized in the treatment of Heart Failure. This Care Team includes your primary Heart Failure Specialized Cardiologist (physician), Advanced Practice Providers (APPs- Physician Assistants and Nurse Practitioners), and Pharmacist who all work together to provide you with the care you need, when you need it.   You may see any of the following providers on your designated Care Team at your next follow up:  Dr. Jules Oar Dr. Peder Bourdon Dr. Alwin Baars Dr. Judyth Nunnery Nieves Bars, NP Ruddy Corral, [place] [facility], [place] Dennise Fitz, NP [place] Odai Wimmer, NP Luster Salters, PharmD   Please be sure to bring in all your medications bottles to every appointment.   Need to Contact Us :  If you have any questions or concerns before your next appointment please send us  a message through [place] or call our office at [phone].    TO LEAVE A MESSAGE FOR THE NURSE SELECT OPTION 2, PLEASE LEAVE A MESSAGE INCLUDING: YOUR NAME DATE OF BIRTH CALL BACK NUMBER REASON FOR CALL**this is important as we prioritize the call backs  YOU WILL RECEIVE A CALL BACK THE SAME DAY AS LONG AS YOU CALL BEFORE 4:00 PM

## 2023-12-23 NOTE — Telephone Encounter (Signed)
 Advanced Heart Failure Patient Advocate Encounter  Test billing for Entresto  returns $47 copay for 30 days, $141 copay for 90 days.  Kennis Peacock, CPhT Rx Patient Advocate Phone: [phone]

## 2023-12-23 NOTE — Patient Instructions (Signed)
 Medication Instructions:  Your physician recommends that you continue on your current medications as directed. Please refer to the Current Medication list given to you today. *If you need a refill on your cardiac medications before your next appointment, please call your pharmacy*  Lab Work: None ordered If you have labs (blood work) drawn today and your tests are completely normal, you will receive your results only by: MyChart Message (if you have MyChart) OR A paper copy in the mail If you have any lab test that is abnormal or we need to change your treatment, we will call you to review the results.  Testing/Procedures: None ordered  Follow-Up: At [facility], you and your health needs are our priority.  As part of our continuing mission to provide you with exceptional heart care, our providers are all part of one team.  This team includes your primary Cardiologist (physician) and Advanced Practice Providers or APPs (Physician Assistants and Nurse Practitioners) who all work together to provide you with the care you need, when you need it.  Your next appointment:   1 month(s)  Provider:   Antoinette Batman, MD    We recommend signing up for the patient portal called "MyChart".  Sign up information is provided on this After Visit Summary.  MyChart is used to connect with patients for Virtual Visits (Telemedicine).  Patients are able to view lab/test results, encounter notes, upcoming appointments, etc.  Non-urgent messages can be sent to your provider as well.   To learn more about what you can do with MyChart, go to [email or web address].   Other Instructions

## 2023-12-24 ENCOUNTER — Telehealth: Payer: Self-pay

## 2023-12-24 DIAGNOSIS — R1312 Dysphagia, oropharyngeal phase: Secondary | ICD-10-CM | POA: Diagnosis not present

## 2023-12-24 DIAGNOSIS — M6281 Muscle weakness (generalized): Secondary | ICD-10-CM | POA: Diagnosis not present

## 2023-12-24 NOTE — Telephone Encounter (Signed)
 Pharmacy needs clarification about pt's allergies and if the pt can take Entresto  with a lisinopril  allergy .  Copied from CRM [id number]. Topic: Clinical - Medication Question >> Dec 24, 2023  2:05 PM Jayson Michael wrote: Reason for CRM: [facility]- requesting clarification before administering Entresto . Patient has a documented prior reaction to lisinopril  (reaction listed as unknown).  Pharmacy would like to confirm if this impacts the ability to administer Entresto .  Called [facility] spoke with Alondra who transferred me to clinical intake but got VM- gave theresa the clinic fax number she said she would send over t fax over a formal request for clarification [phone]

## 2023-12-25 ENCOUNTER — Encounter: Payer: Self-pay | Admitting: Nurse Practitioner

## 2023-12-25 ENCOUNTER — Non-Acute Institutional Stay (SKILLED_NURSING_FACILITY): Payer: Self-pay | Admitting: Nurse Practitioner

## 2023-12-25 DIAGNOSIS — K219 Gastro-esophageal reflux disease without esophagitis: Secondary | ICD-10-CM | POA: Diagnosis not present

## 2023-12-25 DIAGNOSIS — E039 Hypothyroidism, unspecified: Secondary | ICD-10-CM

## 2023-12-25 DIAGNOSIS — R296 Repeated falls: Secondary | ICD-10-CM | POA: Diagnosis not present

## 2023-12-25 DIAGNOSIS — E785 Hyperlipidemia, unspecified: Secondary | ICD-10-CM

## 2023-12-25 DIAGNOSIS — R1312 Dysphagia, oropharyngeal phase: Secondary | ICD-10-CM | POA: Diagnosis not present

## 2023-12-25 DIAGNOSIS — M6281 Muscle weakness (generalized): Secondary | ICD-10-CM | POA: Diagnosis not present

## 2023-12-25 DIAGNOSIS — R29898 Other symptoms and signs involving the musculoskeletal system: Secondary | ICD-10-CM | POA: Diagnosis not present

## 2023-12-25 DIAGNOSIS — I480 Paroxysmal atrial fibrillation: Secondary | ICD-10-CM

## 2023-12-25 DIAGNOSIS — I25810 Atherosclerosis of coronary artery bypass graft(s) without angina pectoris: Secondary | ICD-10-CM

## 2023-12-25 DIAGNOSIS — F339 Major depressive disorder, recurrent, unspecified: Secondary | ICD-10-CM

## 2023-12-25 DIAGNOSIS — I10 Essential (primary) hypertension: Secondary | ICD-10-CM | POA: Diagnosis not present

## 2023-12-25 DIAGNOSIS — I5022 Chronic systolic (congestive) heart failure: Secondary | ICD-10-CM | POA: Diagnosis not present

## 2023-12-25 DIAGNOSIS — R2681 Unsteadiness on feet: Secondary | ICD-10-CM | POA: Diagnosis not present

## 2023-12-25 DIAGNOSIS — R2689 Other abnormalities of gait and mobility: Secondary | ICD-10-CM | POA: Diagnosis not present

## 2023-12-25 NOTE — Assessment & Plan Note (Signed)
 blood pressure is controlled, on Losartan, Metoprolol, diuretics.

## 2023-12-25 NOTE — Assessment & Plan Note (Signed)
 on ASA, Atorvastatin, LDL 55 10/14/23

## 2023-12-25 NOTE — Assessment & Plan Note (Signed)
 better with Effexor , failed Zoloft , feels sleeps too much and depressed.

## 2023-12-25 NOTE — Telephone Encounter (Signed)
 Noted! Thank you

## 2023-12-25 NOTE — Assessment & Plan Note (Signed)
 mild edema BLE, RLE>LLE, off Farxiga  due to recurrent UTI, on Metoprolol , Metolazone , Spironolactone , Torsemide 

## 2023-12-25 NOTE — Progress Notes (Signed)
 Location:  [facility] Room Number: N066-B Place of Service:  SNF (31) Provider:  Nylah Butkus,NP  Kalena Mander X, NP  Patient Care Team: Jaina Morin X, NP as PCP - General (Internal Medicine) Odie Benne, MD as PCP - Cardiology (Cardiology) Verona Goodwill, MD as PCP - Electrophysiology (Cardiology) Christina Coyer, MD as Consulting Physician (Urology) Alver Austin, [facility] (Inactive) as Pharmacist (Pharmacist) Dema Filler, MD as Consulting Physician (Ophthalmology)  Extended Emergency Contact Information Primary Emergency Contact: [facility] Address: [address]          [place], [place] [address] [place] Phone: [phone] Relation: Daughter Secondary Emergency Contact: Bluford Burkitt Address: [address]          [place], [place] [address] [place] Home Phone: [phone] Mobile Phone: [phone] Relation: Daughter  Code Status:  FULL Goals of care: Advanced Directive information    12/30/2023   10:42 AM  Advanced Directives  Does Patient Have a Medical Advance Directive? Yes  Type of Estate agent of [name];Living will  Does patient want to make changes to medical advance directive? No - Patient declined  Copy of Healthcare Power of Attorney in Chart? Yes - validated most recent copy scanned in chart (See row information)     Chief Complaint  Patient presents with   Medical Management of Chronic Issues    Routine Visit.    HPI:  Pt is a 89 y.o. [name] seen today for medical management of chronic diseases.       UTI, urine culture 12/16/23 showed ESBL, susceptible to Nitrofurantoin, Ceftazidime, Cefepime. The patient is in his usual stated of health, he is afebrile. C/o burning sensation and urinary frequency. Denied abd or lower back pain. Fully treated with Nitrofurantoin              Hospitalized 11/17/23-11/27/23 for hypoxic respiratory failure COVID/PNA  and acute HRmrEF, left pleural effusion-s/p thoracentesis with 1000cc of bloody fluid removed exudative by LDH,  improved SOB, Orthopnea, and edema BLE, treated with Prednisone , bronchodilators, antibiotics, and diuretics. Underwent evaluation of pulmonology, cardiology.              12/15/23 CXR mild CHF, no gross focal lund consolidation, pleural effusion, pneumothorax             Leukocytosis, wbc 20.4 11/27/23<<9.7 12/16/23             Gout, no flare up over a year, R+L wrist, only takes Allopurinol , uric acid 7.6 10/10/22             Afib, s/p pace maker, defibrillator , on Amiodarone , Pradaxa , Metoprolol , followed by cardiology.              HLD on ASA, Atorvastatin , LDL 55 10/14/23             CAD, Hx of CABF, TAVR, no chest pain, taking Isosorbide              CHF, mild edema BLE, RLE>LLE, off Farxiga  due to recurrent UTI, on Metoprolol , Metolazone , Spironolactone , Torsemide              Hypothyroidism, on Levothyroxine , TSH 3.27 10/14/23             HTN, blood pressure is controlled, on Losartan , Metoprolol , diuretics.              GERD, stable, on Omeprazole , Hx of Reglan  use, Hgb 12.8 12/16/23  Depression, better with Effexor , failed Zoloft , feels sleeps too much and depressed.              CKD Bun/creat 25/1.29 12/16/23             T2DM, Hgb A1c 6.8 10/14/23, on insulin         Past Medical History:  Diagnosis Date   Age-related macular degeneration, dry, both eyes    Allergic    "24/7; 365 days/year; I'm allergic to pollens, dust, all southern grasses/trees, mold, mildue, cats, dogs" (01/07/2017)   Anal fissure    Asthma    sees Dr. Elverna Hamman    Benign prostatic hypertrophy    (sees Dr. Verdel Gitelman   CAD (coronary artery disease)    a. s/p CABG 2007. b. Cath 08/2016 - 4/4 patent grafts.   Carotid bruit    carotid u/s 10/10: 0.39% bilaterally   Chronic combined systolic and diastolic CHF (congestive heart failure) (HCC)    Complication of anesthesia 1980s   "w/anal cyst OR, he  gave me a saddle block then put a narcotic in spinal cord; had a severe reaction to that" (01/07/2017)   Congestive heart failure (CHF) [facility])    ED (erectile dysfunction)    Family history of adverse reaction to anesthesia    "daughter wakes up during OR" (01/07/2017)   GERD (gastroesophageal reflux disease)    Gout    HTN (hypertension)    Hx of colonic polyps    (sees Dr. Elvin Hammer)   Hyperlipidemia    Hypothyroidism    Moderate to severe aortic stenosis    a. s/p TAVR 02/2017.   Myocardial infarction [facility]) ~ 2000   Obesity    Osteoarthritis    "was in my knees, hands" (01/07/2017 )   PAF (paroxysmal atrial fibrillation) (HCC)    a. documented post TAVR.   Precancerous skin lesion    (sees Dr. 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   29 mm Edwards Sapien 3 transcatheter heart valve placed via percutaneous right transfemoral approach   Past Surgical History:  Procedure Laterality Date   ANUS SURGERY     "opened it back up cause it wouldn't heal; wound up w/a fissure" (01/07/2017)   BIV ICD INSERTION CRT-D N/A 05/02/2017   Procedure: BIV ICD INSERTION CRT-D;  Surgeon: Verona Goodwill, MD;  Location: [facility] INVASIVE CV LAB;  Service: Cardiovascular;  Laterality: N/A;   CARDIAC CATHETERIZATION  10/29/2005   CARDIAC CATHETERIZATION N/A 08/27/2016   Procedure: Right/Left Heart Cath and Coronary/Graft Angiography;  Surgeon: Odie Benne, MD;  Location: [facility] INVASIVE CV LAB;  Service: Cardiovascular;  Laterality: N/A;   CARDIOVERSION N/A 04/24/2021   Procedure: CARDIOVERSION;  Surgeon: Elmyra Haggard, MD;  Location: [facility] ENDOSCOPY;  Service: Cardiovascular;  Laterality: N/A;   CARDIOVERSION N/A 11/08/2021   Procedure: CARDIOVERSION;  Surgeon: Hazle Lites, MD;  Location: [facility] ENDOSCOPY;  Service: Cardiovascular;  Laterality: N/A;   CARDIOVERSION N/A 02/06/2022   Procedure: CARDIOVERSION;  Surgeon: Loyde Rule, MD;  Location: [facility] ENDOSCOPY;  Service: Cardiovascular;  Laterality: N/A;   CATARACT EXTRACTION W/ INTRAOCULAR LENS  IMPLANT, BILATERAL Bilateral    CATARACT EXTRACTION, BILATERAL  2012   COLONOSCOPY  06/30/2008   no repeats needed    COLONOSCOPY     had 3 or 4 in the past    CORONARY ARTERY BYPASS GRAFT  2007   "CABG  X4"   CYST EXCISION PERINEAL  1980s   HAMMER TOE SURGERY Bilateral    ICD GENERATOR CHANGEOUT N/A 05/30/2023   Procedure: ICD GENERATOR CHANGEOUT;  Surgeon: Verona Goodwill, MD;  Location: [facility] INVASIVE CV LAB;  Service: Cardiovascular;  Laterality: N/A;   JOINT REPLACEMENT     KNEE ARTHROPLASTY  07/30/2011   Procedure: COMPUTER ASSISTED TOTAL KNEE ARTHROPLASTY;  Surgeon: Jasmine Mesi;  Location: [facility] OR;  Service: Orthopedics;  Laterality: Left;  left total knee arthroplasty   MASTECTOMY SUBCUTANEOUS Bilateral    MULTIPLE TOOTH EXTRACTIONS     ORIF FINGER / THUMB FRACTURE Right ~ 1980   "repair of thumb injury"   PROSTATE BIOPSY     REPLACEMENT TOTAL KNEE BILATERAL Bilateral 2012   TEE WITHOUT CARDIOVERSION N/A 03/04/2017   Procedure: TRANSESOPHAGEAL ECHOCARDIOGRAM (TEE);  Surgeon: Odie Benne, MD;  Location: [facility] OR;  Service: Open Heart Surgery;  Laterality: N/A;   TOTAL KNEE REVISION Right 05/18/2019   Procedure: RIGHT PATELLA REVISION/REMOVAL;  Surgeon: Jasmine Mesi, MD;  Location: [facility] OR;  Service: Orthopedics;  Laterality: Right;   TRANSCATHETER AORTIC VALVE REPLACEMENT, TRANSFEMORAL N/A 03/04/2017   Procedure: TRANSCATHETER AORTIC VALVE REPLACEMENT, TRANSFEMORAL;  Surgeon: Odie Benne, MD;  Location: [facility] OR;  Service: Open Heart Surgery;  Laterality: N/A;    Allergies  Allergen Reactions   Peanut-Containing Drug Products Anaphylaxis   Sulfonamide Derivatives Anaphylaxis   Amlodipine  Swelling and Other (See Comments)    Swelling in ankles   Eliquis  [Apixaban ] Other (See Comments)    Back/hip pain   Lisinopril  Cough   Xarelto   [Rivaroxaban ] Other (See Comments)    Back/hip pain    Outpatient Encounter Medications as of 12/25/2023  Medication Sig   albuterol  (VENTOLIN  HFA) 108 (90 Base) MCG/ACT inhaler Inhale 2 puffs into the lungs every 4 (four) hours as needed for wheezing or shortness of breath. (Patient not taking: Reported on 12/31/2023)   allopurinol  (ZYLOPRIM ) 100 MG tablet Take 1 tablet (100 mg total) by mouth daily.   amiodarone  (PACERONE ) 200 MG tablet Take 1 tablet (200 mg total) by mouth daily.   aspirin  81 MG tablet Take 1 tablet (81 mg total) by mouth daily.   atorvastatin  (LIPITOR) 20 MG tablet TAKE ONE TABLET ONCE DAILY   Baclofen 5 MG TABS Take 5 mg by mouth every 12 (twelve) hours as needed.   cetirizine (ZYRTEC) 10 MG tablet Take 10 mg by mouth 2 (two) times daily. As needed   colchicine  0.6 MG tablet Take 1 tablet (0.6 mg total) by mouth every 6 (six) hours as needed (gout).   Continuous Glucose Receiver (DEXCOM G6 RECEIVER) DEVI 1 Device by Does not apply route 3 (three) times daily. E11.69 (Patient not taking: Reported on 12/31/2023)   Continuous Glucose Sensor (DEXCOM G6 SENSOR) MISC 1 Device by Does not apply route 3 (three) times daily. E11.69 (Patient not taking: Reported on 12/31/2023)   Continuous Glucose Transmitter (DEXCOM G6 TRANSMITTER) MISC 1 Device by Does not apply route 3 (three) times daily. E11.69 (Patient not taking: Reported on 12/31/2023)   dabigatran  (PRADAXA ) 150 MG CAPS capsule TAKE (1) CAPSULE TWICE DAILY.   insulin  aspart (NOVOLOG ) 100 UNIT/ML injection Inject 4 Units into the skin 3 (three) times daily before meals. Inject as per sliding scale: if 141 - 180 = 4 units Inject per sliding scale; 181 - 220 = 6 units Inject per sliding scale; 221 - 260 = 8 units Inject per sliding scale; 261 - 300 =  10 units Inject per sliding scale; 301 - 350 = 12 units Inject per sliding scale; 351 - 400= 14 units Inject per sliding scale; 401+ = 16 units Inject per sliding scale, subcutaneously three  times a day   isosorbide  mononitrate (IMDUR ) 30 MG 24 hr tablet Take 1 tablet (30 mg total) by mouth daily.   ketotifen  (ZADITOR ) 0.025 % ophthalmic solution Place 1 drop into both eyes daily as needed (for itching).   levothyroxine  (SYNTHROID ) 25 MCG tablet Take 25 mcg by mouth daily.   levothyroxine  (SYNTHROID ) 75 MCG tablet Take 75 mcg by mouth daily. Take 2 tablets once daily   magic mouthwash SOLN Take 5 mLs by mouth as directed. Suspension contains equal amounts of Maalox Extra Strength, nystatin, and diphenhydramine . RX'ed by dentist   metolazone  (ZAROXOLYN ) 2.5 MG tablet Take 1 tablet (2.5 mg total) by mouth once a week every Saturday   metoprolol  succinate (TOPROL -XL) 50 MG 24 hr tablet Take 1 tablet (50 mg total) by mouth daily. Take with or immediately following a meal.   montelukast  (SINGULAIR ) 10 MG tablet TAKE ONE TABLET BY MOUTH ONCE DAILY IN THE EVENING   Multiple Minerals-Vitamins (CAL MAG ZINC +D3 PO) Take 2 tablets by mouth in the morning. 500 mg / 7 mg / 25 mcg   Multiple Vitamins-Minerals (MULTI-VITAMIN GUMMIES) CHEW Chew 2 tablets by mouth See admin instructions. Chew 2 gummies by mouth in the morning   Multiple Vitamins-Minerals (PRESERVISION AREDS 2) CAPS Take 1 capsule by mouth in the morning and at bedtime.   nitrofurantoin (MACRODANTIN) 100 MG capsule Take 100 mg by mouth 2 (two) times daily.   pantoprazole  (PROTONIX ) 40 MG tablet Take 1 tablet (40 mg total) by mouth daily.   potassium chloride  SA (KLOR-CON  M) 20 MEQ tablet Take 1 tablet (20 mEq total) by mouth 2 (two) times daily.   sacubitril-valsartan (ENTRESTO ) 24-26 MG TAKE 1/2 TABLET TWICE DAILY   spironolactone  (ALDACTONE ) 25 MG tablet Take 1 tablet (25 mg total) by mouth daily.   torsemide  (DEMADEX ) 20 MG tablet Take 2 tablets (40 mg total) by mouth daily.   triamcinolone  cream (KENALOG ) 0.1 % Apply 1 application  topically daily as needed for dry skin (affected areas).   valACYclovir  (VALTREX ) 500 MG tablet Take  500 mg by mouth 2 (two) times daily as needed (cold sores).   venlafaxine  XR (EFFEXOR  XR) 37.5 MG 24 hr capsule Take 1 capsule (37.5 mg total) by mouth daily with breakfast.   [DISCONTINUED] metoCLOPramide  (REGLAN ) 10 MG tablet TAKE ONE TABLET BY MOUTH EVERY MORNING WITH BREAKFAST   levothyroxine  (SYNTHROID ) 175 MCG tablet Take 1 tablet (175 mcg total) by mouth daily before breakfast.   methocarbamol  (ROBAXIN ) 500 MG tablet Take 1 tablet (500 mg total) by mouth 4 (four) times daily as needed for muscle spasms.   silver  sulfADIAZINE  (SILVADENE ) 1 % cream Apply 1 Application topically 2 (two) times daily.   No facility-administered encounter medications on file as of 12/25/2023.    Review of Systems  Constitutional:  Negative for appetite change, fatigue and fever.  HENT:  Negative for congestion and trouble swallowing.   Eyes:  Negative for visual disturbance.  Respiratory:  Positive for cough. Negative for shortness of breath and wheezing.   Cardiovascular:  Positive for leg swelling. Negative for chest pain and palpitations.  Gastrointestinal:  Negative for abdominal pain and constipation.  Genitourinary:  Positive for dysuria and frequency. Negative for urgency.       Nocturnal urination about every  2-3 hours.   Musculoskeletal:  Positive for arthralgias and gait problem.  Skin:  Positive for rash.  Neurological:  Negative for dizziness, speech difficulty and weakness.  Psychiatric/Behavioral:  Positive for dysphoric mood. Negative for confusion and sleep disturbance. The patient is not nervous/anxious.     Immunization History  Administered Date(s) Administered   Fluad Quad(high Dose 65+) 05/06/2019, 05/30/2020, 06/18/2021, 05/27/2023   Hep A / Hep B 04/06/2012, 10/07/2012   Hepatitis B 05/07/2012   Influenza Split 06/28/2013   Influenza Whole 06/05/2007, 05/13/2008, 06/06/2009, 05/30/2010   Influenza, High Dose Seasonal PF 05/15/2018, 05/31/2022   Influenza,inj,Quad PF,6+ Mos  05/20/2016   Influenza-Unspecified 05/25/2014, 07/08/2015, 05/06/2017, 07/29/2023   Moderna Covid-19 Vaccine Bivalent Booster 42yrs & up 05/31/2022   PFIZER(Purple Top)SARS-COV-2 Vaccination 09/28/2019, 10/12/2019, 05/30/2020   PPD Test 03/31/2023   Pfizer Covid-19 Vaccine Bivalent Booster 23yrs & up 11/18/2021, 07/29/2023   Pneumococcal Conjugate-13 09/18/2015   Pneumococcal Polysaccharide-23 05/13/2008, 05/25/2014   Td 05/30/2010   Tdap 10/27/2020   Zoster Recombinant(Shingrix) 05/18/2018, 10/16/2018   Zoster, Live 10/21/2007   Pertinent  Health Maintenance Due  Topic Date Due   FOOT EXAM  Never done   OPHTHALMOLOGY EXAM  01/07/2024   INFLUENZA VACCINE  03/26/2024   HEMOGLOBIN A1C  04/12/2024      10/10/2022   11:28 AM 03/31/2023   10:48 AM 10/09/2023    1:31 PM 11/13/2023    1:50 PM 12/31/2023    8:52 AM  Fall Risk  Falls in the past year? 1 0 0 0 0  Was there an injury with Fall? 1 0 0 0   Was there an injury with Fall? - Comments Back pain      Fall Risk Category Calculator 3 0 0 0   Patient at Risk for Falls Due to No Fall Risks No Fall Risks History of fall(s) Impaired balance/gait;Impaired mobility   Fall risk Follow up Falls evaluation completed Falls evaluation completed Falls evaluation completed Falls evaluation completed    Functional Status Survey:    Vitals:   12/25/23 1037  BP: 124/66  Pulse: 77  Resp: 18  Temp: (!) 97.3 F (36.3 C)  SpO2: 94%  Weight: 247 lb 3.2 oz (112.1 kg)  Height: 5' 6.5" (1.689 m)   Body mass index is 39.3 kg/m. Physical Exam Vitals and nursing note reviewed.  Constitutional:      Appearance: He is obese.  HENT:     Head: Normocephalic and atraumatic.     Nose: Nose normal.  Eyes:     Extraocular Movements: Extraocular movements intact.     Conjunctiva/sclera: Conjunctivae normal.     Pupils: Pupils are equal, round, and reactive to light.  Cardiovascular:     Rate and Rhythm: Normal rate. Rhythm irregular.     Heart  sounds: No murmur heard.    Comments: Left upper chest pace maker, defibrillator  Pulmonary:     Effort: Pulmonary effort is normal.     Breath sounds: Rales present.     Comments: Right base rales.  Abdominal:     General: Bowel sounds are normal.     Palpations: Abdomen is soft.     Tenderness: There is no abdominal tenderness. There is no right CVA tenderness, left CVA tenderness, guarding or rebound.  Musculoskeletal:     Cervical back: Normal range of motion and neck supple.     Right lower leg: Edema present.     Left lower leg: Edema present.     Comments:  1+ edema BLE, R>L  Skin:    General: Skin is warm and dry.     Findings: Rash present.     Comments: Redness in groin area R+L  Neurological:     General: No focal deficit present.     Mental Status: He is alert and oriented to person, place, and time. Mental status is at baseline.     Motor: No weakness.     Gait: Gait abnormal.  Psychiatric:        Mood and Affect: Mood normal.        Behavior: Behavior normal.        Thought Content: Thought content normal.        Judgment: Judgment normal.     Comments: Stated he feels difficulty adjusting to [facility]      Labs reviewed: Recent Labs    06/30/23 1616 07/01/23 0355 07/02/23 0415 11/25/23 0728 11/26/23 0417 11/27/23 0410 12/16/23 0000 12/23/23 1208  NA  --  140   < > 134* 134* 134* 134* 132*  K  --  4.1   < > 4.0 3.8 3.8 3.9 3.7  CL  --  106   < > 98 97* 96* 92* 93*  CO2  --  24   < > 27 27 29  32* 26  GLUCOSE  --  125*   < > 87 94 106*  --  92  BUN  --  14   < > 41* 43* 46* 25* 20  CREATININE  --  1.30*   < > 1.13 1.11 1.32* 1.3 1.58*  CALCIUM   --  7.8*   < > 7.8* 7.8* 7.7* 7.8* 8.1*  MG 2.7*  --    < > 2.7* 2.6* 2.5*  --   --   PHOS 2.3* 3.3  --   --   --   --   --   --    < > = values in this interval not displayed.   Recent Labs    11/17/23 1211 11/18/23 1918 11/19/23 1844 11/20/23 1317 12/16/23 0000  AST 53* 76*  --  93* 33  ALT 38 45*  --   70* 33  ALKPHOS 135* 147*  --  151* 160*  BILITOT 1.7* 0.8  --  0.8  --   PROT 6.9 6.7 7.3 6.5  --   ALBUMIN  2.2* 2.1*  --  2.1* 2.4*   Recent Labs    11/25/23 0422 11/26/23 0417 11/27/23 0410 12/16/23 0000  WBC 19.5* 19.1* 20.4* 9.7  NEUTROABS 16.4* 16.4* 16.9* 5,645.00  HGB 13.4 13.4 13.3 12.8*  HCT 41.5 43.0 41.9 41  MCV 86.6 86.2 84.5  --   PLT 365 345 352 211   Lab Results  Component Value Date   TSH 0.169 (L) 11/18/2023   Lab Results  Component Value Date   HGBA1C 6.8 (H) 10/14/2023   Lab Results  Component Value Date   CHOL 108 10/14/2023   HDL 33 (L) 10/14/2023   LDLCALC 55 10/14/2023   TRIG 113 10/14/2023   CHOLHDL 3.3 10/14/2023    Significant Diagnostic Results in last 30 days:  DG Chest 2 View Result Date: 01/01/2024 CLINICAL DATA:  Short of breath. EXAM: CHEST - 2 VIEW COMPARISON:  11/20/2023.  CT, 11/19/2023. FINDINGS: Stable changes from prior cardiac surgery. Cardiac silhouette is normal in size. Stable changes from an aortic valve replacement. Left anterior chest wall biventricular cardioverter-defibrillator is stable. No mediastinal or hilar masses. No evidence of adenopathy.  Area lungs demonstrate prominent bronchovascular markings. There is additional opacity at the lung bases with suspected small effusions and atelectasis. No lung consolidation. No pneumothorax. Skeletal structures are demineralized, grossly intact. IMPRESSION: 1. Possible mild degree of congestive heart failure with prominent vascular markings and small effusions. No overt edema. No evidence of pneumonia. Electronically Signed   By: Amanda Jungling M.D.   On: 01/01/2024 11:06    Assessment/Plan Paroxysmal atrial fibrillation (HCC) Heart rate is in control, s/p pace maker, defibrillator , on Amiodarone , Pradaxa , Metoprolol , followed by cardiology.   Hyperlipidemia on ASA, Atorvastatin , LDL 55 10/14/23  CAD, AUTOLOGOUS BYPASS GRAFT  Hx of CABF, TAVR, no chest pain, taking  Isosorbide   Chronic systolic CHF (congestive heart failure) (HCC)  mild edema BLE, RLE>LLE, off Farxiga  due to recurrent UTI, on Metoprolol , Metolazone , Spironolactone , Torsemide   Hypothyroidism on Levothyroxine , TSH 3.27 10/14/23  Essential hypertension blood pressure is controlled, on Losartan , Metoprolol , diuretics.   Gastroesophageal reflux disease  stable, on Omeprazole , Hx of Reglan  use, Hgb 12.8 12/16/23  Recurrent depression (HCC)  better with Effexor , failed Zoloft , feels sleeps too much and depressed.   Type 2 diabetes mellitus (HCC) Hgb A1c 6.8 10/14/23, on insulin     Family/ staff Communication: plan of care reviewed with the patient and charge nurse.   Labs/tests ordered:  none

## 2023-12-25 NOTE — Assessment & Plan Note (Signed)
 stable, on Omeprazole , Hx of Reglan  use, Hgb 12.8 12/16/23

## 2023-12-25 NOTE — Assessment & Plan Note (Signed)
 on Levothyroxine, TSH 3.27 10/14/23

## 2023-12-25 NOTE — Assessment & Plan Note (Signed)
 Hx of CABF, TAVR, no chest pain, taking Isosorbide

## 2023-12-25 NOTE — Assessment & Plan Note (Signed)
 Hgb A1c 6.8 10/14/23, on insulin 

## 2023-12-25 NOTE — Assessment & Plan Note (Signed)
 Heart rate is in control, s/p pace maker, defibrillator , on Amiodarone, Pradaxa, Metoprolol, followed by cardiology.

## 2023-12-25 NOTE — Telephone Encounter (Signed)
Message routed to PCP Mast, Man X, NP

## 2023-12-26 ENCOUNTER — Encounter: Payer: Self-pay | Admitting: Sports Medicine

## 2023-12-26 ENCOUNTER — Non-Acute Institutional Stay (SKILLED_NURSING_FACILITY): Admitting: Sports Medicine

## 2023-12-26 DIAGNOSIS — M6281 Muscle weakness (generalized): Secondary | ICD-10-CM | POA: Diagnosis not present

## 2023-12-26 DIAGNOSIS — R110 Nausea: Secondary | ICD-10-CM | POA: Diagnosis not present

## 2023-12-26 DIAGNOSIS — N3000 Acute cystitis without hematuria: Secondary | ICD-10-CM

## 2023-12-26 DIAGNOSIS — R051 Acute cough: Secondary | ICD-10-CM

## 2023-12-26 DIAGNOSIS — R1312 Dysphagia, oropharyngeal phase: Secondary | ICD-10-CM | POA: Diagnosis not present

## 2023-12-26 DIAGNOSIS — R2689 Other abnormalities of gait and mobility: Secondary | ICD-10-CM | POA: Diagnosis not present

## 2023-12-26 DIAGNOSIS — R0982 Postnasal drip: Secondary | ICD-10-CM

## 2023-12-26 DIAGNOSIS — R2681 Unsteadiness on feet: Secondary | ICD-10-CM | POA: Diagnosis not present

## 2023-12-26 DIAGNOSIS — R29898 Other symptoms and signs involving the musculoskeletal system: Secondary | ICD-10-CM | POA: Diagnosis not present

## 2023-12-26 DIAGNOSIS — R296 Repeated falls: Secondary | ICD-10-CM | POA: Diagnosis not present

## 2023-12-26 NOTE — Progress Notes (Unsigned)
 Provider:  Dr. Tye Gall Location:  [facility] Place of Service:  SNF (31)  PCP: Mast, Man X, NP Patient Care Team: Mast, Man X, NP as PCP - General (Internal Medicine) Odie Benne, MD as PCP - Cardiology (Cardiology) Verona Goodwill, MD as PCP - Electrophysiology (Cardiology) Christina Coyer, MD as Consulting Physician (Urology) Alver Austin, [facility] (Inactive) as Pharmacist (Pharmacist) Dema Filler, MD as Consulting Physician (Ophthalmology)  Extended Emergency Contact Information Primary Emergency Contact: [facility] Address: [address]          [place], [place] [address] [place] Phone: [phone] Relation: Daughter Secondary Emergency Contact: Bluford Burkitt Address: [address]          [place], [place] [address] [place] Home Phone: [phone] Mobile Phone: [phone] Relation: Daughter  Goals of Care: Advanced Directive information    12/25/2023   10:43 AM  Advanced Directives  Does Patient Have a Medical Advance Directive? Yes  Type of Estate agent of [place];Living will;Out of facility DNR (pink MOST or yellow form)  Does patient want to make changes to medical advance directive? No - Patient declined  Copy of Healthcare Power of Attorney in Chart? Yes - validated most recent copy scanned in chart (See row information)      Chief Complaint  Patient presents with   Emesis    vomiting        History of Present Illness  89 year old male with a past medical history of gout, A-fib, hyperlipidemia, CAD, CHF, hypothyroidism, hypertension is evaluated for an acute visit for nausea and vomitings.  Pt seen and examined in his room  Sitting in recliner chair Able to speak in full sentences, does not appear to be in distress  Nausea Pt c/o feeling nauseous for the past 2 days and had vomiting after dinner for the past 2 days. Denies abdominal  pain. Had bowel movement this morning    Dysuria  Pt is currently on abx  for UTI C/o increase urinary frequency Denies fevers  Cough C/o  cough with intermittent phlegm  Has exertional SOB  But no recent change Has lower extremity swelling    Past Medical History:  Diagnosis Date   Age-related macular degeneration, dry, both eyes    Allergic    "24/7; 365 days/year; I'm allergic to pollens, dust, all southern grasses/trees, mold, mildue, cats, dogs" (01/07/2017)   Anal fissure    Asthma    sees Dr. Elverna Hamman    Benign prostatic hypertrophy    (sees Dr. Verdel Gitelman   CAD (coronary artery disease)    a. s/p CABG 2007. b. Cath 08/2016 - 4/4 patent grafts.   Carotid bruit    carotid u/s 10/10: 0.39% bilaterally   Chronic combined systolic and diastolic CHF (congestive heart failure) (HCC)    Complication of anesthesia 1980s   "w/anal cyst OR, he gave me a saddle block then put a narcotic in spinal cord; had a severe reaction to that" (01/07/2017)   Congestive heart failure (CHF) [facility])    ED (erectile dysfunction)    Family history of adverse reaction to anesthesia    "daughter wakes up during OR" (01/07/2017)   GERD (gastroesophageal reflux disease)    Gout    HTN (hypertension)    Hx of colonic polyps    (sees Dr. Elvin Hammer)   Hyperlipidemia    Hypothyroidism    Moderate to severe aortic stenosis    a. s/p TAVR 02/2017.   Myocardial  infarction [facility]) ~ 2000   Obesity    Osteoarthritis    "was in my knees, hands" (01/07/2017 )   PAF (paroxysmal atrial fibrillation) (HCC)    a. documented post TAVR.   Precancerous skin lesion    (sees Dr. 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   29 mm Edwards Sapien 3 transcatheter heart valve placed via percutaneous right transfemoral approach   Past Surgical History:  Procedure Laterality Date   ANUS SURGERY     "opened it back up cause it wouldn't  heal; wound up w/a fissure" (01/07/2017)   BIV ICD INSERTION CRT-D N/A 05/02/2017   Procedure: BIV ICD INSERTION CRT-D;  Surgeon: Verona Goodwill, MD;  Location: [facility] INVASIVE CV LAB;  Service: Cardiovascular;  Laterality: N/A;   CARDIAC CATHETERIZATION  10/29/2005   CARDIAC CATHETERIZATION N/A 08/27/2016   Procedure: Right/Left Heart Cath and Coronary/Graft Angiography;  Surgeon: Odie Benne, MD;  Location: [facility] INVASIVE CV LAB;  Service: Cardiovascular;  Laterality: N/A;   CARDIOVERSION N/A 04/24/2021   Procedure: CARDIOVERSION;  Surgeon: Elmyra Haggard, MD;  Location: [facility] ENDOSCOPY;  Service: Cardiovascular;  Laterality: N/A;   CARDIOVERSION N/A 11/08/2021   Procedure: CARDIOVERSION;  Surgeon: Hazle Lites, MD;  Location: [facility] ENDOSCOPY;  Service: Cardiovascular;  Laterality: N/A;   CARDIOVERSION N/A 02/06/2022   Procedure: CARDIOVERSION;  Surgeon: Loyde Rule, MD;  Location: [facility] ENDOSCOPY;  Service: Cardiovascular;  Laterality: N/A;   CATARACT EXTRACTION W/ INTRAOCULAR LENS  IMPLANT, BILATERAL Bilateral    CATARACT EXTRACTION, BILATERAL  2012   COLONOSCOPY  06/30/2008   no repeats needed    COLONOSCOPY     had 3 or 4 in the past    CORONARY ARTERY BYPASS GRAFT  2007   "CABG X4"   CYST EXCISION PERINEAL  1980s   HAMMER TOE SURGERY Bilateral    ICD GENERATOR CHANGEOUT N/A 05/30/2023   Procedure: ICD GENERATOR CHANGEOUT;  Surgeon: Verona Goodwill, MD;  Location: [facility] INVASIVE CV LAB;  Service: Cardiovascular;  Laterality: N/A;   JOINT REPLACEMENT     KNEE ARTHROPLASTY  07/30/2011   Procedure: COMPUTER ASSISTED TOTAL KNEE ARTHROPLASTY;  Surgeon: Jasmine Mesi;  Location: [facility] OR;  Service: Orthopedics;  Laterality: Left;  left total knee arthroplasty   MASTECTOMY SUBCUTANEOUS Bilateral    MULTIPLE TOOTH EXTRACTIONS     ORIF FINGER / THUMB FRACTURE Right ~ 1980   "repair of thumb injury"   PROSTATE BIOPSY     REPLACEMENT TOTAL KNEE BILATERAL Bilateral 2012   TEE WITHOUT CARDIOVERSION  N/A 03/04/2017   Procedure: TRANSESOPHAGEAL ECHOCARDIOGRAM (TEE);  Surgeon: Odie Benne, MD;  Location: [facility] OR;  Service: Open Heart Surgery;  Laterality: N/A;   TOTAL KNEE REVISION Right 05/18/2019   Procedure: RIGHT PATELLA REVISION/REMOVAL;  Surgeon: Jasmine Mesi, MD;  Location: [facility] OR;  Service: Orthopedics;  Laterality: Right;   TRANSCATHETER AORTIC VALVE REPLACEMENT, TRANSFEMORAL N/A 03/04/2017   Procedure: TRANSCATHETER AORTIC VALVE REPLACEMENT, TRANSFEMORAL;  Surgeon: Odie Benne, MD;  Location: [facility] OR;  Service: Open Heart Surgery;  Laterality: N/A;    reports that he quit smoking about 62 years ago. His smoking use included cigarettes. He started smoking about 75 years ago. He has a 45.5 pack-year smoking history. He has never used smokeless tobacco. He reports that he does not currently use alcohol after a past usage of about 7.0 standard drinks  of alcohol per week. He reports that he does not use drugs. Social History   Socioeconomic History   Marital status: Married    Spouse name: Not on file   Number of children: 3   Years of education: Not on file   Highest education level: Not on file  Occupational History   Occupation: Product/process development scientist    Comment: builds malls  Tobacco Use   Smoking status: Former    Current packs/day: 0.00    Average packs/day: 3.5 packs/day for 13.0 years (45.5 ttl pk-yrs)    Types: Cigarettes    Start date: 83    Quit date: 1963    Years since quitting: 62.3   Smokeless tobacco: Never   Tobacco comments:    Former smoker 04/19/21  Vaping Use   Vaping status: Never Used  Substance and Sexual Activity   Alcohol use: Not Currently    Alcohol/week: 7.0 standard drinks of alcohol    Types: 7 Cans of beer per week    Comment: 1 beer daily after 5pm (04/19/21)   Drug use: No   Sexual activity: Never  Other Topics Concern   Not on file  Social History Narrative   FH of CAD, Male 1st degree relative less than age 13.    Social Drivers of Corporate investment banker Strain: Low Risk  (03/31/2023)   Overall Financial Resource Strain (CARDIA)    Difficulty of Paying Living Expenses: Not very hard  Food Insecurity: No Food Insecurity (11/18/2023)   Hunger Vital Sign    Worried About Running Out of Food in the Last Year: Never true    Ran Out of Food in the Last Year: Never true  Transportation Needs: No Transportation Needs (11/18/2023)   PRAPARE - Administrator, Civil Service (Medical): No    Lack of Transportation (Non-Medical): No  Physical Activity: Sufficiently Active (03/31/2023)   Exercise Vital Sign    Days of Exercise per Week: 7 days    Minutes of Exercise per Session: 30 min  Stress: No Stress Concern Present (03/31/2023)   [employer] of Occupational Health - Occupational Stress Questionnaire    Feeling of Stress : Not at all  Social Connections: Socially Integrated (11/18/2023)   Social Connection and Isolation Panel [NHANES]    Frequency of Communication with Friends and Family: Three times a week    Frequency of Social Gatherings with Friends and Family: Three times a week    Attends Religious Services: More than 4 times per year    Active Member of Clubs or Organizations: Yes    Attends Banker Meetings: More than 4 times per year    Marital Status: Married  Catering manager Violence: Not At Risk (11/17/2023)   Humiliation, Afraid, Rape, and Kick questionnaire    Fear of Current or Ex-Partner: No    Emotionally Abused: No    Physically Abused: No    Sexually Abused: No    Functional Status Survey:    Family History  Problem Relation Age of Onset   Heart attack Father 74   Allergic rhinitis Father    Asthma Father    Heart failure Mother 51   Uterine cancer Mother    Breast cancer Mother    Colon cancer Neg Hx    Esophageal cancer Neg Hx     Health Maintenance  Topic Date Due   FOOT EXAM  Never done   COVID-19 Vaccine (7 - 2024-25 season)  05/26/2025 (Originally  09/23/2023)   OPHTHALMOLOGY EXAM  01/07/2024   INFLUENZA VACCINE  03/26/2024   Medicare Annual Wellness (AWV)  03/30/2024   HEMOGLOBIN A1C  04/12/2024   DTaP/Tdap/Td (3 - Td or Tdap) 10/28/2030   Pneumonia Vaccine 51+ Years old  Completed   Zoster Vaccines- Shingrix  Completed   HPV VACCINES  Aged Out   Meningococcal B Vaccine  Aged Out    Allergies  Allergen Reactions   Peanut-Containing Drug Products Anaphylaxis   Sulfonamide Derivatives Anaphylaxis   Amlodipine  Swelling and Other (See Comments)    Swelling in ankles   Eliquis  [Apixaban ] Other (See Comments)    Back/hip pain   Lisinopril  Cough   Xarelto  [Rivaroxaban ] Other (See Comments)    Back/hip pain    Outpatient Encounter Medications as of 12/26/2023  Medication Sig   albuterol  (VENTOLIN  HFA) 108 (90 Base) MCG/ACT inhaler Inhale 2 puffs into the lungs every 4 (four) hours as needed for wheezing or shortness of breath.   allopurinol  (ZYLOPRIM ) 100 MG tablet Take 1 tablet (100 mg total) by mouth daily.   amiodarone  (PACERONE ) 200 MG tablet Take 1 tablet (200 mg total) by mouth daily.   aspirin  81 MG tablet Take 1 tablet (81 mg total) by mouth daily.   atorvastatin  (LIPITOR) 20 MG tablet TAKE ONE TABLET ONCE DAILY   Baclofen 5 MG TABS Take 5 mg by mouth every 12 (twelve) hours as needed.   cetirizine (ZYRTEC) 10 MG tablet Take 10 mg by mouth 2 (two) times daily. As needed   colchicine  0.6 MG tablet Take 1 tablet (0.6 mg total) by mouth every 6 (six) hours as needed (gout).   Continuous Glucose Receiver (DEXCOM G6 RECEIVER) DEVI 1 Device by Does not apply route 3 (three) times daily. E11.69   Continuous Glucose Sensor (DEXCOM G6 SENSOR) MISC 1 Device by Does not apply route 3 (three) times daily. E11.69   Continuous Glucose Transmitter (DEXCOM G6 TRANSMITTER) MISC 1 Device by Does not apply route 3 (three) times daily. E11.69   dabigatran  (PRADAXA ) 150 MG CAPS capsule TAKE (1) CAPSULE TWICE DAILY.    insulin  aspart (NOVOLOG ) 100 UNIT/ML injection Inject 4 Units into the skin 3 (three) times daily before meals. Inject as per sliding scale: if 141 - 180 = 4 units Inject per sliding scale; 181 - 220 = 6 units Inject per sliding scale; 221 - 260 = 8 units Inject per sliding scale; 261 - 300 = 10 units Inject per sliding scale; 301 - 350 = 12 units Inject per sliding scale; 351 - 400= 14 units Inject per sliding scale; 401+ = 16 units Inject per sliding scale, subcutaneously three times a day   isosorbide  mononitrate (IMDUR ) 30 MG 24 hr tablet Take 1 tablet (30 mg total) by mouth daily.   ketotifen  (ZADITOR ) 0.025 % ophthalmic solution Place 1 drop into both eyes daily as needed (for itching).   levothyroxine  (SYNTHROID ) 175 MCG tablet Take 1 tablet (175 mcg total) by mouth daily before breakfast. (Patient not taking: Reported on 12/25/2023)   levothyroxine  (SYNTHROID ) 25 MCG tablet Take 25 mcg by mouth daily.   levothyroxine  (SYNTHROID ) 75 MCG tablet Take 75 mcg by mouth daily. Take 2 tablets once daily   magic mouthwash SOLN Take 5 mLs by mouth as directed. Suspension contains equal amounts of Maalox Extra Strength, nystatin, and diphenhydramine . RX'ed by dentist   methocarbamol  (ROBAXIN ) 500 MG tablet Take 1 tablet (500 mg total) by mouth 4 (four) times daily as needed for muscle spasms. (  Patient not taking: Reported on 12/25/2023)   metoCLOPramide  (REGLAN ) 10 MG tablet TAKE ONE TABLET BY MOUTH EVERY MORNING WITH BREAKFAST   metolazone  (ZAROXOLYN ) 2.5 MG tablet Take 1 tablet (2.5 mg total) by mouth once a week every Saturday   metoprolol  succinate (TOPROL -XL) 50 MG 24 hr tablet Take 1 tablet (50 mg total) by mouth daily. Take with or immediately following a meal.   montelukast  (SINGULAIR ) 10 MG tablet TAKE ONE TABLET BY MOUTH ONCE DAILY IN THE EVENING   Multiple Minerals-Vitamins (CAL MAG ZINC +D3 PO) Take 2 tablets by mouth in the morning. 500 mg / 7 mg / 25 mcg   Multiple Vitamins-Minerals  (MULTI-VITAMIN GUMMIES) CHEW Chew 2 tablets by mouth See admin instructions. Chew 2 gummies by mouth in the morning   Multiple Vitamins-Minerals (PRESERVISION AREDS 2) CAPS Take 1 capsule by mouth in the morning and at bedtime.   nitrofurantoin (MACRODANTIN) 100 MG capsule Take 100 mg by mouth 2 (two) times daily.   pantoprazole  (PROTONIX ) 40 MG tablet Take 1 tablet (40 mg total) by mouth daily.   potassium chloride  SA (KLOR-CON  M) 20 MEQ tablet Take 1 tablet (20 mEq total) by mouth 2 (two) times daily.   sacubitril-valsartan (ENTRESTO ) 24-26 MG TAKE 1/2 TABLET TWICE DAILY   silver  sulfADIAZINE  (SILVADENE ) 1 % cream Apply 1 Application topically 2 (two) times daily. (Patient not taking: Reported on 12/25/2023)   spironolactone  (ALDACTONE ) 25 MG tablet Take 1 tablet (25 mg total) by mouth daily.   torsemide  (DEMADEX ) 20 MG tablet Take 2 tablets (40 mg total) by mouth daily.   triamcinolone  cream (KENALOG ) 0.1 % Apply 1 application  topically daily as needed for dry skin (affected areas).   valACYclovir  (VALTREX ) 500 MG tablet Take 500 mg by mouth 2 (two) times daily as needed (cold sores).   venlafaxine  XR (EFFEXOR  XR) 37.5 MG 24 hr capsule Take 1 capsule (37.5 mg total) by mouth daily with breakfast.   No facility-administered encounter medications on file as of 12/26/2023.    Review of Systems  Constitutional:  Negative for chills and fever.  HENT:  Negative for sore throat.   Respiratory:  Positive for cough. Negative for shortness of breath and wheezing.   Cardiovascular:  Negative for chest pain, palpitations and leg swelling.  Gastrointestinal:  Positive for nausea and vomiting. Negative for abdominal distention, abdominal pain, blood in stool, constipation and diarrhea.  Genitourinary:  Positive for dysuria.  Psychiatric/Behavioral:  Negative for confusion.    Negative unless indicated in HPI.  There were no vitals filed for this visit. There is no height or weight on file to calculate  BMI. BP Readings from Last 3 Encounters:  12/25/23 124/66  12/23/23 122/64  12/23/23 (!) 100/42   Wt Readings from Last 3 Encounters:  12/25/23 247 lb 3.2 oz (112.1 kg)  12/23/23 246 lb 12.8 oz (111.9 kg)  12/23/23 247 lb (112 kg)   Physical Exam Constitutional:      Appearance: Normal appearance.  HENT:     Head: Normocephalic and atraumatic.  Cardiovascular:     Rate and Rhythm: Normal rate and regular rhythm.     Pulses: Normal pulses.     Heart sounds: Normal heart sounds.  Pulmonary:     Effort: No respiratory distress.     Breath sounds: No stridor. No wheezing or rales.  Abdominal:     General: Bowel sounds are normal. There is no distension.     Palpations: Abdomen is soft.  Tenderness: There is no abdominal tenderness. There is no guarding.  Musculoskeletal:        General: Swelling present.  Neurological:     Mental Status: He is alert. Mental status is at baseline.     Motor: No weakness.     Labs reviewed: Basic Metabolic Panel: Recent Labs    06/30/23 1616 07/01/23 0355 07/02/23 0415 11/25/23 0728 11/26/23 0417 11/27/23 0410 12/16/23 0000 12/23/23 1208  NA  --  140   < > 134* 134* 134* 134* 132*  K  --  4.1   < > 4.0 3.8 3.8 3.9 3.7  CL  --  106   < > 98 97* 96* 92* 93*  CO2  --  24   < > 27 27 29  32* 26  GLUCOSE  --  125*   < > 87 94 106*  --  92  BUN  --  14   < > 41* 43* 46* 25* 20  CREATININE  --  1.30*   < > 1.13 1.11 1.32* 1.3 1.58*  CALCIUM   --  7.8*   < > 7.8* 7.8* 7.7* 7.8* 8.1*  MG 2.7*  --    < > 2.7* 2.6* 2.5*  --   --   PHOS 2.3* 3.3  --   --   --   --   --   --    < > = values in this interval not displayed.   Liver Function Tests: Recent Labs    11/17/23 1211 11/18/23 1918 11/19/23 1844 11/20/23 1317 12/16/23 0000  AST 53* 76*  --  93* 33  ALT 38 45*  --  70* 33  ALKPHOS 135* 147*  --  151* 160*  BILITOT 1.7* 0.8  --  0.8  --   PROT 6.9 6.7 7.3 6.5  --   ALBUMIN  2.2* 2.1*  --  2.1* 2.4*   No results for input(s):  "LIPASE", "AMYLASE" in the last 8760 hours. No results for input(s): "AMMONIA" in the last 8760 hours. CBC: Recent Labs    11/25/23 0422 11/26/23 0417 11/27/23 0410 12/16/23 0000  WBC 19.5* 19.1* 20.4* 9.7  NEUTROABS 16.4* 16.4* 16.9* 5,645.00  HGB 13.4 13.4 13.3 12.8*  HCT 41.5 43.0 41.9 41  MCV 86.6 86.2 84.5  --   PLT 365 345 352 211   Cardiac Enzymes: Recent Labs    11/22/23 0231  CKTOTAL 29*  CKMB 4.0   BNP: Invalid input(s): "POCBNP" Lab Results  Component Value Date   HGBA1C 6.8 (H) 10/14/2023   Lab Results  Component Value Date   TSH 0.169 (L) 11/18/2023   Lab Results  Component Value Date   VITAMINB12 503 10/14/2023   No results found for: "FOLATE" Lab Results  Component Value Date   IRON 151 06/30/2023   TIBC 400 06/30/2023   FERRITIN 28 06/30/2023    Imaging and Procedures obtained prior to SNF admission: No results found.  Assessment and Plan Assessment & Plan  1. Acute cystitis without hematuria (Primary) Pt still c/o dysuira, frequency  Will repeat UA , culture.  2. Nausea Will increase reglan  to bid Will check lft   3. Acute cough Lungs clear Probably related to post nasal drip  Will start allegra, flonase   4. Post-nasal drip Will start allegra, flonase    Other orders - metoCLOPramide  (REGLAN ) 10 MG tablet; Take 10 mg by mouth daily.    30 min Total time spent for obtaining history,  performing a medically appropriate examination  and evaluation, reviewing the tests,  documenting clinical information in the electronic or other health record,  ,care coordination (not separately reported)

## 2023-12-26 NOTE — Progress Notes (Unsigned)
 Justin [name]

## 2023-12-27 DIAGNOSIS — I10 Essential (primary) hypertension: Secondary | ICD-10-CM | POA: Diagnosis not present

## 2023-12-27 DIAGNOSIS — N390 Urinary tract infection, site not specified: Secondary | ICD-10-CM | POA: Diagnosis not present

## 2023-12-28 DIAGNOSIS — M6281 Muscle weakness (generalized): Secondary | ICD-10-CM | POA: Diagnosis not present

## 2023-12-28 DIAGNOSIS — R296 Repeated falls: Secondary | ICD-10-CM | POA: Diagnosis not present

## 2023-12-28 DIAGNOSIS — R29898 Other symptoms and signs involving the musculoskeletal system: Secondary | ICD-10-CM | POA: Diagnosis not present

## 2023-12-28 DIAGNOSIS — R1312 Dysphagia, oropharyngeal phase: Secondary | ICD-10-CM | POA: Diagnosis not present

## 2023-12-28 DIAGNOSIS — R2681 Unsteadiness on feet: Secondary | ICD-10-CM | POA: Diagnosis not present

## 2023-12-28 DIAGNOSIS — R2689 Other abnormalities of gait and mobility: Secondary | ICD-10-CM | POA: Diagnosis not present

## 2023-12-29 ENCOUNTER — Encounter: Payer: Self-pay | Admitting: Sports Medicine

## 2023-12-29 DIAGNOSIS — R2689 Other abnormalities of gait and mobility: Secondary | ICD-10-CM | POA: Diagnosis not present

## 2023-12-29 DIAGNOSIS — R2681 Unsteadiness on feet: Secondary | ICD-10-CM | POA: Diagnosis not present

## 2023-12-29 DIAGNOSIS — R1312 Dysphagia, oropharyngeal phase: Secondary | ICD-10-CM | POA: Diagnosis not present

## 2023-12-29 DIAGNOSIS — M6281 Muscle weakness (generalized): Secondary | ICD-10-CM | POA: Diagnosis not present

## 2023-12-29 DIAGNOSIS — R29898 Other symptoms and signs involving the musculoskeletal system: Secondary | ICD-10-CM | POA: Diagnosis not present

## 2023-12-29 DIAGNOSIS — R296 Repeated falls: Secondary | ICD-10-CM | POA: Diagnosis not present

## 2023-12-30 ENCOUNTER — Non-Acute Institutional Stay (SKILLED_NURSING_FACILITY): Admitting: Adult Health

## 2023-12-30 ENCOUNTER — Encounter: Payer: Self-pay | Admitting: Adult Health

## 2023-12-30 ENCOUNTER — Ambulatory Visit: Payer: Self-pay

## 2023-12-30 DIAGNOSIS — R1312 Dysphagia, oropharyngeal phase: Secondary | ICD-10-CM | POA: Diagnosis not present

## 2023-12-30 DIAGNOSIS — R29898 Other symptoms and signs involving the musculoskeletal system: Secondary | ICD-10-CM | POA: Diagnosis not present

## 2023-12-30 DIAGNOSIS — I5042 Chronic combined systolic (congestive) and diastolic (congestive) heart failure: Secondary | ICD-10-CM

## 2023-12-30 DIAGNOSIS — R2689 Other abnormalities of gait and mobility: Secondary | ICD-10-CM | POA: Diagnosis not present

## 2023-12-30 DIAGNOSIS — R296 Repeated falls: Secondary | ICD-10-CM | POA: Diagnosis not present

## 2023-12-30 DIAGNOSIS — N390 Urinary tract infection, site not specified: Secondary | ICD-10-CM | POA: Diagnosis not present

## 2023-12-30 DIAGNOSIS — I10 Essential (primary) hypertension: Secondary | ICD-10-CM | POA: Diagnosis not present

## 2023-12-30 DIAGNOSIS — R2681 Unsteadiness on feet: Secondary | ICD-10-CM | POA: Diagnosis not present

## 2023-12-30 DIAGNOSIS — E039 Hypothyroidism, unspecified: Secondary | ICD-10-CM | POA: Diagnosis not present

## 2023-12-30 DIAGNOSIS — M6281 Muscle weakness (generalized): Secondary | ICD-10-CM | POA: Diagnosis not present

## 2023-12-30 NOTE — Progress Notes (Signed)
 Location:  [facility] Room Number: N066-B Place of Service:  SNF (31) Provider:  Medina-Vargas, Domnic Vantol, DNP, FNP-BC  Patient Care Team: Mast, Man X, NP as PCP - General (Internal Medicine) Odie Benne, MD as PCP - Cardiology (Cardiology) Verona Goodwill, MD as PCP - Electrophysiology (Cardiology) Christina Coyer, MD as Consulting Physician (Urology) Alver Austin, [facility] (Inactive) as Pharmacist (Pharmacist) Dema Filler, MD as Consulting Physician (Ophthalmology)  Extended Emergency Contact Information Primary Emergency Contact: [facility] Address: [address]          [place], [place] [address] [place] Phone: [phone] Relation: Daughter Secondary Emergency Contact: Bluford Burkitt Address: [address]          [place], [place] [address] [place] Home Phone: [phone] Mobile Phone: [phone] Relation: Daughter  Code Status:  Full Code  Goals of care: Advanced Directive information    12/30/2023   10:42 AM  Advanced Directives  Does Patient Have a Medical Advance Directive? Yes  Type of Estate agent of [place];Living will  Does patient want to make changes to medical advance directive? No - Patient declined  Copy of Healthcare Power of Attorney in Chart? Yes - validated most recent copy scanned in chart (See row information)     Chief Complaint  Patient presents with   Labs Only     abnormal urine culture.    HPI:  Pt is a 89 y.o. male seen today for an acute visit regarding abnormal urine culture.  He is a resident of [facility]. He complains of burning sensation whenever he urinates.  Urine culture showed greater than 100,000 CFU/mL E. coli.  He has chronic combined systolic and diastolic heart failure.  Latest BNP 381.  He takes metolazone  2.5 mg daily on Saturdays, torsemide  20 mg 2 tablets daily, spironolactone  25 mg  daily, Entresto  24-26 mg half tablet twice a day. No shortness of breath.   He takes levothyroxine  175 mcg daily for hypothyroidism.  Latest TSH 0.169, taken 11/18/2023.    Past Medical History:  Diagnosis Date   Age-related macular degeneration, dry, both eyes    Allergic    "24/7; 365 days/year; I'm allergic to pollens, dust, all southern grasses/trees, mold, mildue, cats, dogs" (01/07/2017)   Anal fissure    Asthma    sees Dr. Elverna Hamman    Benign prostatic hypertrophy    (sees Dr. Verdel Gitelman   CAD (coronary artery disease)    a. s/p CABG 2007. b. Cath 08/2016 - 4/4 patent grafts.   Carotid bruit    carotid u/s 10/10: 0.39% bilaterally   Chronic combined systolic and diastolic CHF (congestive heart failure) (HCC)    Complication of anesthesia 1980s   "w/anal cyst OR, he gave me a saddle block then put a narcotic in spinal cord; had a severe reaction to that" (01/07/2017)   Congestive heart failure (CHF) [facility])    ED (erectile dysfunction)    Family history of adverse reaction to anesthesia    "daughter wakes up during OR" (01/07/2017)   GERD (gastroesophageal reflux disease)    Gout    HTN (hypertension)    Hx of colonic polyps    (sees Dr. Elvin Hammer)   Hyperlipidemia    Hypothyroidism    Moderate to severe aortic stenosis    a. s/p TAVR 02/2017.   Myocardial infarction (HCC) ~ 2000   Obesity    Osteoarthritis    "was in my knees, hands" (  01/07/2017 )   PAF (paroxysmal atrial fibrillation) (HCC)    a. documented post TAVR.   Precancerous skin lesion    (sees Dr. 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   29 mm Edwards Sapien 3 transcatheter heart valve placed via percutaneous right transfemoral approach   Past Surgical History:  Procedure Laterality Date   ANUS SURGERY     "opened it back up cause it wouldn't heal; wound up w/a fissure" (01/07/2017)   BIV ICD INSERTION CRT-D N/A 05/02/2017    Procedure: BIV ICD INSERTION CRT-D;  Surgeon: Verona Goodwill, MD;  Location: [facility] INVASIVE CV LAB;  Service: Cardiovascular;  Laterality: N/A;   CARDIAC CATHETERIZATION  10/29/2005   CARDIAC CATHETERIZATION N/A 08/27/2016   Procedure: Right/Left Heart Cath and Coronary/Graft Angiography;  Surgeon: Odie Benne, MD;  Location: [facility] INVASIVE CV LAB;  Service: Cardiovascular;  Laterality: N/A;   CARDIOVERSION N/A 04/24/2021   Procedure: CARDIOVERSION;  Surgeon: Elmyra Haggard, MD;  Location: [facility] ENDOSCOPY;  Service: Cardiovascular;  Laterality: N/A;   CARDIOVERSION N/A 11/08/2021   Procedure: CARDIOVERSION;  Surgeon: Hazle Lites, MD;  Location: [facility] ENDOSCOPY;  Service: Cardiovascular;  Laterality: N/A;   CARDIOVERSION N/A 02/06/2022   Procedure: CARDIOVERSION;  Surgeon: Loyde Rule, MD;  Location: [facility] ENDOSCOPY;  Service: Cardiovascular;  Laterality: N/A;   CATARACT EXTRACTION W/ INTRAOCULAR LENS  IMPLANT, BILATERAL Bilateral    CATARACT EXTRACTION, BILATERAL  2012   COLONOSCOPY  06/30/2008   no repeats needed    COLONOSCOPY     had 3 or 4 in the past    CORONARY ARTERY BYPASS GRAFT  2007   "CABG X4"   CYST EXCISION PERINEAL  1980s   HAMMER TOE SURGERY Bilateral    ICD GENERATOR CHANGEOUT N/A 05/30/2023   Procedure: ICD GENERATOR CHANGEOUT;  Surgeon: Verona Goodwill, MD;  Location: [facility] INVASIVE CV LAB;  Service: Cardiovascular;  Laterality: N/A;   JOINT REPLACEMENT     KNEE ARTHROPLASTY  07/30/2011   Procedure: COMPUTER ASSISTED TOTAL KNEE ARTHROPLASTY;  Surgeon: Jasmine Mesi;  Location: [facility] OR;  Service: Orthopedics;  Laterality: Left;  left total knee arthroplasty   MASTECTOMY SUBCUTANEOUS Bilateral    MULTIPLE TOOTH EXTRACTIONS     ORIF FINGER / THUMB FRACTURE Right ~ 1980   "repair of thumb injury"   PROSTATE BIOPSY     REPLACEMENT TOTAL KNEE BILATERAL Bilateral 2012   TEE WITHOUT CARDIOVERSION N/A 03/04/2017   Procedure: TRANSESOPHAGEAL ECHOCARDIOGRAM (TEE);  Surgeon: Odie Benne, MD;  Location: [facility] OR;  Service: Open Heart Surgery;  Laterality: N/A;   TOTAL KNEE REVISION Right 05/18/2019   Procedure: RIGHT PATELLA REVISION/REMOVAL;  Surgeon: Jasmine Mesi, MD;  Location: [facility] OR;  Service: Orthopedics;  Laterality: Right;   TRANSCATHETER AORTIC VALVE REPLACEMENT, TRANSFEMORAL N/A 03/04/2017   Procedure: TRANSCATHETER AORTIC VALVE REPLACEMENT, TRANSFEMORAL;  Surgeon: Odie Benne, MD;  Location: [facility] OR;  Service: Open Heart Surgery;  Laterality: N/A;    Allergies  Allergen Reactions   Peanut-Containing Drug Products Anaphylaxis   Sulfonamide Derivatives Anaphylaxis   Amlodipine  Swelling and Other (See Comments)    Swelling in ankles   Eliquis  [Apixaban ] Other (See Comments)    Back/hip pain   Lisinopril  Cough   Xarelto  [Rivaroxaban ] Other (See Comments)    Back/hip pain    Outpatient Encounter Medications as of 12/30/2023  Medication Sig  albuterol  (VENTOLIN  HFA) 108 (90 Base) MCG/ACT inhaler Inhale 2 puffs into the lungs every 4 (four) hours as needed for wheezing or shortness of breath.   allopurinol  (ZYLOPRIM ) 100 MG tablet Take 1 tablet (100 mg total) by mouth daily.   amiodarone  (PACERONE ) 200 MG tablet Take 1 tablet (200 mg total) by mouth daily.   aspirin  81 MG tablet Take 1 tablet (81 mg total) by mouth daily.   atorvastatin  (LIPITOR) 20 MG tablet TAKE ONE TABLET ONCE DAILY   Baclofen 5 MG TABS Take 5 mg by mouth every 12 (twelve) hours as needed.   benzonatate  (TESSALON ) 100 MG capsule Take 100 mg by mouth 2 (two) times daily.   calcium  carbonate (OS-CAL) 1250 (500 Ca) MG chewable tablet Chew 1 tablet by mouth daily.   cetirizine (ZYRTEC) 10 MG tablet Take 10 mg by mouth 2 (two) times daily. As needed   colchicine  0.6 MG tablet Take 1 tablet (0.6 mg total) by mouth every 6 (six) hours as needed (gout).   Continuous Glucose Receiver (DEXCOM G6 RECEIVER) DEVI 1 Device by Does not apply route 3 (three) times daily. E11.69    Continuous Glucose Sensor (DEXCOM G6 SENSOR) MISC 1 Device by Does not apply route 3 (three) times daily. E11.69   Continuous Glucose Transmitter (DEXCOM G6 TRANSMITTER) MISC 1 Device by Does not apply route 3 (three) times daily. E11.69   dabigatran  (PRADAXA ) 150 MG CAPS capsule TAKE (1) CAPSULE TWICE DAILY.   docusate sodium  (COLACE) 100 MG capsule Take 100 mg by mouth 2 (two) times daily.   fluticasone  (FLONASE ) 50 MCG/ACT nasal spray Place 2 sprays into both nostrils daily.   insulin  aspart (NOVOLOG ) 100 UNIT/ML injection Inject 4 Units into the skin 3 (three) times daily before meals. Inject as per sliding scale: if 141 - 180 = 4 units Inject per sliding scale; 181 - 220 = 6 units Inject per sliding scale; 221 - 260 = 8 units Inject per sliding scale; 261 - 300 = 10 units Inject per sliding scale; 301 - 350 = 12 units Inject per sliding scale; 351 - 400= 14 units Inject per sliding scale; 401+ = 16 units Inject per sliding scale, subcutaneously three times a day   isosorbide  mononitrate (IMDUR ) 30 MG 24 hr tablet Take 1 tablet (30 mg total) by mouth daily.   ketotifen  (ZADITOR ) 0.025 % ophthalmic solution Place 1 drop into both eyes daily as needed (for itching).   levothyroxine  (SYNTHROID ) 25 MCG tablet Take 25 mcg by mouth daily.   levothyroxine  (SYNTHROID ) 75 MCG tablet Take 75 mcg by mouth daily. Take 2 tablets once daily   metoCLOPramide  (REGLAN ) 10 MG tablet Take 10 mg by mouth daily.   metolazone  (ZAROXOLYN ) 2.5 MG tablet Take 1 tablet (2.5 mg total) by mouth once a week every Saturday   metoprolol  succinate (TOPROL -XL) 50 MG 24 hr tablet Take 1 tablet (50 mg total) by mouth daily. Take with or immediately following a meal.   montelukast  (SINGULAIR ) 10 MG tablet TAKE ONE TABLET BY MOUTH ONCE DAILY IN THE EVENING   Multiple Minerals-Vitamins (CAL MAG ZINC +D3 PO) Take 2 tablets by mouth in the morning. 500 mg / 7 mg / 25 mcg   Multiple Vitamins-Minerals (MULTI-VITAMIN GUMMIES) CHEW Chew 2  tablets by mouth See admin instructions. Chew 2 gummies by mouth in the morning   Multiple Vitamins-Minerals (PRESERVISION AREDS 2) CAPS Take 1 capsule by mouth in the morning and at bedtime.   nystatin (MYCOSTATIN/NYSTOP) powder Apply  1 Application topically 2 (two) times daily.   pantoprazole  (PROTONIX ) 40 MG tablet Take 1 tablet (40 mg total) by mouth daily.   polyethylene glycol powder (GLYCOLAX/MIRALAX) 17 GM/SCOOP powder Take by mouth every 12 (twelve) hours as needed for moderate constipation.   potassium chloride  SA (KLOR-CON  M) 20 MEQ tablet Take 1 tablet (20 mEq total) by mouth 2 (two) times daily.   sacubitril-valsartan (ENTRESTO ) 24-26 MG TAKE 1/2 TABLET TWICE DAILY   spironolactone  (ALDACTONE ) 25 MG tablet Take 1 tablet (25 mg total) by mouth daily.   torsemide  (DEMADEX ) 20 MG tablet Take 2 tablets (40 mg total) by mouth daily.   triamcinolone  cream (KENALOG ) 0.1 % Apply 1 application  topically daily as needed for dry skin (affected areas).   valACYclovir  (VALTREX ) 500 MG tablet Take 500 mg by mouth 2 (two) times daily as needed (cold sores).   venlafaxine  XR (EFFEXOR  XR) 37.5 MG 24 hr capsule Take 1 capsule (37.5 mg total) by mouth daily with breakfast.   levothyroxine  (SYNTHROID ) 175 MCG tablet Take 1 tablet (175 mcg total) by mouth daily before breakfast. (Patient not taking: Reported on 12/30/2023)   magic mouthwash SOLN Take 5 mLs by mouth as directed. Suspension contains equal amounts of Maalox Extra Strength, nystatin, and diphenhydramine . RX'ed by dentist (Patient not taking: Reported on 12/30/2023)   methocarbamol  (ROBAXIN ) 500 MG tablet Take 1 tablet (500 mg total) by mouth 4 (four) times daily as needed for muscle spasms. (Patient not taking: Reported on 12/26/2023)   nitrofurantoin (MACRODANTIN) 100 MG capsule Take 100 mg by mouth 2 (two) times daily.   silver  sulfADIAZINE  (SILVADENE ) 1 % cream Apply 1 Application topically 2 (two) times daily. (Patient not taking: Reported on  12/25/2023)   No facility-administered encounter medications on file as of 12/30/2023.    Review of Systems  Constitutional:  Negative for activity change, appetite change and fever.  HENT:  Negative for sore throat.   Eyes: Negative.   Cardiovascular:  Negative for chest pain and leg swelling.  Gastrointestinal:  Negative for abdominal distention, diarrhea and vomiting.  Genitourinary:  Positive for dysuria. Negative for frequency and urgency.  Skin:  Negative for color change.  Neurological:  Negative for dizziness and headaches.  Psychiatric/Behavioral:  Negative for behavioral problems and sleep disturbance. The patient is not nervous/anxious.       Immunization History  Administered Date(s) Administered   Fluad Quad(high Dose 65+) 05/06/2019, 05/30/2020, 06/18/2021, 05/27/2023   Hep A / Hep B 04/06/2012, 10/07/2012   Hepatitis B 05/07/2012   Influenza Split 06/28/2013   Influenza Whole 06/05/2007, 05/13/2008, 06/06/2009, 05/30/2010   Influenza, High Dose Seasonal PF 05/15/2018, 05/31/2022   Influenza,inj,Quad PF,6+ Mos 05/20/2016   Influenza-Unspecified 05/25/2014, 07/08/2015, 05/06/2017, 07/29/2023   Moderna Covid-19 Vaccine Bivalent Booster 1yrs & up 05/31/2022   PFIZER(Purple Top)SARS-COV-2 Vaccination 09/28/2019, 10/12/2019, 05/30/2020   PPD Test 03/31/2023   Pfizer Covid-19 Vaccine Bivalent Booster 14yrs & up 11/18/2021, 07/29/2023   Pneumococcal Conjugate-13 09/18/2015   Pneumococcal Polysaccharide-23 05/13/2008, 05/25/2014   Td 05/30/2010   Tdap 10/27/2020   Zoster Recombinant(Shingrix) 05/18/2018, 10/16/2018   Zoster, Live 10/21/2007   Pertinent  Health Maintenance Due  Topic Date Due   FOOT EXAM  Never done   OPHTHALMOLOGY EXAM  01/07/2024   INFLUENZA VACCINE  03/26/2024   HEMOGLOBIN A1C  04/12/2024      03/25/2022   11:20 AM 10/10/2022   11:28 AM 03/31/2023   10:48 AM 10/09/2023    1:31 PM 11/13/2023  1:50 PM  Fall Risk  Falls in the past year? 0 1 0 0 0   Was there an injury with Fall? 0 1 0 0 0  Was there an injury with Fall? - Comments  Back pain     Fall Risk Category Calculator 0 3 0 0 0  Fall Risk Category (Retired) Low      (RETIRED) Patient Fall Risk Level Low fall risk      Patient at Risk for Falls Due to Medication side effect No Fall Risks No Fall Risks History of fall(s) Impaired balance/gait;Impaired mobility  Fall risk Follow up Falls evaluation completed;Education provided;Falls prevention discussed Falls evaluation completed Falls evaluation completed Falls evaluation completed Falls evaluation completed     Vitals:   12/30/23 1038  BP: 105/68  Pulse: 92  Resp: 18  Temp: (!) 97.3 F (36.3 C)  SpO2: 93%  Weight: 246 lb 3.2 oz (111.7 kg)  Height: 5' 6.5" (1.689 m)   Body mass index is 39.14 kg/m.  Physical Exam Constitutional:      General: He is not in acute distress.    Appearance: He is obese.  HENT:     Head: Normocephalic and atraumatic.     Mouth/Throat:     Mouth: Mucous membranes are moist.  Eyes:     Conjunctiva/sclera: Conjunctivae normal.  Cardiovascular:     Rate and Rhythm: Normal rate and regular rhythm.     Pulses: Normal pulses.     Heart sounds: Normal heart sounds.  Pulmonary:     Effort: Pulmonary effort is normal.     Breath sounds: Normal breath sounds.  Abdominal:     General: Bowel sounds are normal.     Palpations: Abdomen is soft.  Musculoskeletal:        General: No swelling. Normal range of motion.     Cervical back: Normal range of motion.  Skin:    General: Skin is warm and dry.  Neurological:     General: No focal deficit present.     Mental Status: He is alert and oriented to person, place, and time.  Psychiatric:        Mood and Affect: Mood normal.        Behavior: Behavior normal.        Thought Content: Thought content normal.        Judgment: Judgment normal.        Labs reviewed: Recent Labs    06/30/23 1616 07/01/23 0355 07/02/23 0415  11/25/23 0728 11/26/23 0417 11/27/23 0410 12/16/23 0000 12/23/23 1208  NA  --  140   < > 134* 134* 134* 134* 132*  K  --  4.1   < > 4.0 3.8 3.8 3.9 3.7  CL  --  106   < > 98 97* 96* 92* 93*  CO2  --  24   < > 27 27 29  32* 26  GLUCOSE  --  125*   < > 87 94 106*  --  92  BUN  --  14   < > 41* 43* 46* 25* 20  CREATININE  --  1.30*   < > 1.13 1.11 1.32* 1.3 1.58*  CALCIUM   --  7.8*   < > 7.8* 7.8* 7.7* 7.8* 8.1*  MG 2.7*  --    < > 2.7* 2.6* 2.5*  --   --   PHOS 2.3* 3.3  --   --   --   --   --   --    < > =  values in this interval not displayed.   Recent Labs    11/17/23 1211 11/18/23 1918 11/19/23 1844 11/20/23 1317 12/16/23 0000  AST 53* 76*  --  93* 33  ALT 38 45*  --  70* 33  ALKPHOS 135* 147*  --  151* 160*  BILITOT 1.7* 0.8  --  0.8  --   PROT 6.9 6.7 7.3 6.5  --   ALBUMIN  2.2* 2.1*  --  2.1* 2.4*   Recent Labs    11/25/23 0422 11/26/23 0417 11/27/23 0410 12/16/23 0000  WBC 19.5* 19.1* 20.4* 9.7  NEUTROABS 16.4* 16.4* 16.9* 5,645.00  HGB 13.4 13.4 13.3 12.8*  HCT 41.5 43.0 41.9 41  MCV 86.6 86.2 84.5  --   PLT 365 345 352 211   Lab Results  Component Value Date   TSH 0.169 (L) 11/18/2023   Lab Results  Component Value Date   HGBA1C 6.8 (H) 10/14/2023   Lab Results  Component Value Date   CHOL 108 10/14/2023   HDL 33 (L) 10/14/2023   LDLCALC 55 10/14/2023   TRIG 113 10/14/2023   CHOLHDL 3.3 10/14/2023    Significant Diagnostic Results in last 30 days:  No results found.  Assessment/Plan  This SmartLink has not been configured with any valid records.  1. Urinary tract infection without hematuria, site unspecified (Primary) - Urine culture > 100,000 CFU/mL E. Coli (ESBL) - Will start on Macrobid 100 mg 1 tab twice a day  2. Chronic combined systolic and diastolic heart failure (HCC) -BNP 381, down from 579.7 - No shortness of breath - Continue metolazone  2.5 mg daily on Saturdays, torsemide  20 mg 2 tablets daily, spironolactone  25 mg daily,  Entresto  24-26 mg half tablet twice a day  3. Hypothyroidism, unspecified type Lab Results  Component Value Date   TSH 0.169 (L) 11/18/2023    - Continue current doses of levothyroxine  - For redraw of TSH    Family/ staff Communication: Discussed plan of care with resident and charge nurse  Labs/tests ordered: None    Inge Mangle, DNP, MSN, FNP-BC [facility] [phone] (Monday-Friday 8:00 a.m. - 5:00 p.m.) [phone] (after hours)

## 2023-12-30 NOTE — Telephone Encounter (Signed)
 No triage completed, pt is currently admitted at [facility] with 24 hours nursing care. Due to increasing SOB/hypoxia MD at facility advised pt follow up with pulm, OV scheduled. This RN educated pt on home care, new-worsening symptoms, when to call back/seek emergent care. Pt verbalized understanding and agrees to plan.   Copied from CRM [id number]. Topic: Clinical - Red Word Triage >> Dec 30, 2023  5:10 PM Eveleen Hinds B wrote: [facility] that prompted transfer to Nurse Triage: Breathing issues, low oxygen Reason for Disposition  Nursing judgment or information in reference  Answer Assessment - Initial Assessment Questions 1. REASON FOR CALL: "What is your main concern right now?"     Schedule Pulm FU appt 2. ONSET: "When did the symptoms start?"     Pt was recently discharged after 11 days inpt, currently in rehab 3. SEVERITY: "How bad is the symptoms?"     Hypoxia occurring more often with exertion 4. FEVER: "Do you have a fever?"     None 5. OTHER SYMPTOMS: "Do you have any other new symptoms?"     None 6. TREATMENTS AND RESPONSE: "What have you done so far to try to make this better? What medicines have you used?"     Pt currently has 24 hour nursing care  Protocols used: No Guideline Available-A-AH

## 2023-12-31 ENCOUNTER — Telehealth (HOSPITAL_BASED_OUTPATIENT_CLINIC_OR_DEPARTMENT_OTHER): Payer: Self-pay

## 2023-12-31 ENCOUNTER — Ambulatory Visit (INDEPENDENT_AMBULATORY_CARE_PROVIDER_SITE_OTHER)

## 2023-12-31 ENCOUNTER — Ambulatory Visit: Admitting: Pulmonary Disease

## 2023-12-31 VITALS — BP 106/69 | HR 92 | Ht 66.0 in | Wt 247.2 lb

## 2023-12-31 DIAGNOSIS — R2689 Other abnormalities of gait and mobility: Secondary | ICD-10-CM | POA: Diagnosis not present

## 2023-12-31 DIAGNOSIS — Z87891 Personal history of nicotine dependence: Secondary | ICD-10-CM | POA: Diagnosis not present

## 2023-12-31 DIAGNOSIS — R0602 Shortness of breath: Secondary | ICD-10-CM | POA: Diagnosis not present

## 2023-12-31 DIAGNOSIS — R296 Repeated falls: Secondary | ICD-10-CM | POA: Diagnosis not present

## 2023-12-31 DIAGNOSIS — M6281 Muscle weakness (generalized): Secondary | ICD-10-CM | POA: Diagnosis not present

## 2023-12-31 DIAGNOSIS — I4891 Unspecified atrial fibrillation: Secondary | ICD-10-CM

## 2023-12-31 DIAGNOSIS — I2510 Atherosclerotic heart disease of native coronary artery without angina pectoris: Secondary | ICD-10-CM | POA: Diagnosis not present

## 2023-12-31 DIAGNOSIS — R29898 Other symptoms and signs involving the musculoskeletal system: Secondary | ICD-10-CM | POA: Diagnosis not present

## 2023-12-31 DIAGNOSIS — R1312 Dysphagia, oropharyngeal phase: Secondary | ICD-10-CM | POA: Diagnosis not present

## 2023-12-31 DIAGNOSIS — R2681 Unsteadiness on feet: Secondary | ICD-10-CM | POA: Diagnosis not present

## 2023-12-31 NOTE — Patient Instructions (Addendum)
 Schedule for pulmonary function test  Chest x-ray today  Ambulatory oximetry to ascertain need for oxygen with activity  Make sure you follow-up with your sleep study  Make sure you stay active with physical therapy  Make sure you continue being compliant with your water pills  Follow-up in about 6 to 8 weeks

## 2023-12-31 NOTE — Telephone Encounter (Signed)
 Pt had appt today on 12/31/2023

## 2023-12-31 NOTE — Progress Notes (Signed)
 Justin [name]    [id number]    04/06/1934  Primary Care Physician:Mast, Man X, NP  Referring Physician: Mast, Man X, NP [address] [place],  [place] [address]  Chief complaint:   Cough and shortness of breath  HPI:  Shortness of breath Desaturations during physical therapy  History of heart failure, cardiomyopathy Recent evaluation for possible sleep apnea  Was recently hospitalized with respite tract infection, pleural effusion for which he required thoracentesis Was hospitalized for by 11 days  Has been noted to desaturate into the 80s with activity  He does have macular degeneration Needs a walker for stability He is able to walk for about 5 to 6 minutes Ongoing treatment for urinary tract infection  History of chronic systolic failure, coronary artery disease, CABG in 2007, aortic stenosis s/p TAVR, permanent A-fib, HFrEF, cardiac resynchronization therapy in 2024, chronic kidney disease, hypothyroidism  Reformed smoker quit over 60 years ago but was smoking about 3 and half packs a day  Recent evaluation with Dr. Kieran Pellet for sleep apnea, has a sleep study scheduled  No pertinent occupational history  Outpatient Encounter Medications as of 12/31/2023  Medication Sig   allopurinol  (ZYLOPRIM ) 100 MG tablet Take 1 tablet (100 mg total) by mouth daily.   amiodarone  (PACERONE ) 200 MG tablet Take 1 tablet (200 mg total) by mouth daily.   aspirin  81 MG tablet Take 1 tablet (81 mg total) by mouth daily.   atorvastatin  (LIPITOR) 20 MG tablet TAKE ONE TABLET ONCE DAILY   Baclofen 5 MG TABS Take 5 mg by mouth every 12 (twelve) hours as needed.   benzonatate  (TESSALON ) 100 MG capsule Take 100 mg by mouth 2 (two) times daily.   calcium  carbonate (OS-CAL) 1250 (500 Ca) MG chewable tablet Chew 1 tablet by mouth daily.   cetirizine (ZYRTEC) 10 MG tablet Take 10 mg by mouth 2 (two) times daily. As needed   colchicine  0.6 MG tablet Take 1 tablet (0.6 mg total) by  mouth every 6 (six) hours as needed (gout).   dabigatran  (PRADAXA ) 150 MG CAPS capsule TAKE (1) CAPSULE TWICE DAILY.   docusate sodium  (COLACE) 100 MG capsule Take 100 mg by mouth 2 (two) times daily.   fluticasone  (FLONASE ) 50 MCG/ACT nasal spray Place 2 sprays into both nostrils daily.   insulin  aspart (NOVOLOG ) 100 UNIT/ML injection Inject 4 Units into the skin 3 (three) times daily before meals. Inject as per sliding scale: if 141 - 180 = 4 units Inject per sliding scale; 181 - 220 = 6 units Inject per sliding scale; 221 - 260 = 8 units Inject per sliding scale; 261 - 300 = 10 units Inject per sliding scale; 301 - 350 = 12 units Inject per sliding scale; 351 - 400= 14 units Inject per sliding scale; 401+ = 16 units Inject per sliding scale, subcutaneously three times a day   isosorbide  mononitrate (IMDUR ) 30 MG 24 hr tablet Take 1 tablet (30 mg total) by mouth daily.   ketotifen  (ZADITOR ) 0.025 % ophthalmic solution Place 1 drop into both eyes daily as needed (for itching).   levothyroxine  (SYNTHROID ) 175 MCG tablet Take 1 tablet (175 mcg total) by mouth daily before breakfast.   levothyroxine  (SYNTHROID ) 25 MCG tablet Take 25 mcg by mouth daily.   levothyroxine  (SYNTHROID ) 75 MCG tablet Take 75 mcg by mouth daily. Take 2 tablets once daily   magic mouthwash SOLN Take 5 mLs by mouth as directed. Suspension contains  equal amounts of Maalox Extra Strength, nystatin, and diphenhydramine . RX'ed by dentist   methocarbamol  (ROBAXIN ) 500 MG tablet Take 1 tablet (500 mg total) by mouth 4 (four) times daily as needed for muscle spasms.   metoCLOPramide  (REGLAN ) 10 MG tablet Take 10 mg by mouth daily.   metolazone  (ZAROXOLYN ) 2.5 MG tablet Take 1 tablet (2.5 mg total) by mouth once a week every Saturday   metoprolol  succinate (TOPROL -XL) 50 MG 24 hr tablet Take 1 tablet (50 mg total) by mouth daily. Take with or immediately following a meal.   montelukast  (SINGULAIR ) 10 MG tablet TAKE ONE TABLET BY MOUTH  ONCE DAILY IN THE EVENING   Multiple Minerals-Vitamins (CAL MAG ZINC +D3 PO) Take 2 tablets by mouth in the morning. 500 mg / 7 mg / 25 mcg   Multiple Vitamins-Minerals (MULTI-VITAMIN GUMMIES) CHEW Chew 2 tablets by mouth See admin instructions. Chew 2 gummies by mouth in the morning   Multiple Vitamins-Minerals (PRESERVISION AREDS 2) CAPS Take 1 capsule by mouth in the morning and at bedtime.   nitrofurantoin (MACRODANTIN) 100 MG capsule Take 100 mg by mouth 2 (two) times daily.   nystatin (MYCOSTATIN/NYSTOP) powder Apply 1 Application topically 2 (two) times daily.   pantoprazole  (PROTONIX ) 40 MG tablet Take 1 tablet (40 mg total) by mouth daily.   polyethylene glycol powder (GLYCOLAX/MIRALAX) 17 GM/SCOOP powder Take by mouth every 12 (twelve) hours as needed for moderate constipation.   potassium chloride  SA (KLOR-CON  M) 20 MEQ tablet Take 1 tablet (20 mEq total) by mouth 2 (two) times daily.   sacubitril-valsartan (ENTRESTO ) 24-26 MG TAKE 1/2 TABLET TWICE DAILY   silver  sulfADIAZINE  (SILVADENE ) 1 % cream Apply 1 Application topically 2 (two) times daily.   spironolactone  (ALDACTONE ) 25 MG tablet Take 1 tablet (25 mg total) by mouth daily.   torsemide  (DEMADEX ) 20 MG tablet Take 2 tablets (40 mg total) by mouth daily.   triamcinolone  cream (KENALOG ) 0.1 % Apply 1 application  topically daily as needed for dry skin (affected areas).   valACYclovir  (VALTREX ) 500 MG tablet Take 500 mg by mouth 2 (two) times daily as needed (cold sores).   venlafaxine  XR (EFFEXOR  XR) 37.5 MG 24 hr capsule Take 1 capsule (37.5 mg total) by mouth daily with breakfast.   albuterol  (VENTOLIN  HFA) 108 (90 Base) MCG/ACT inhaler Inhale 2 puffs into the lungs every 4 (four) hours as needed for wheezing or shortness of breath. (Patient not taking: Reported on 12/31/2023)   Continuous Glucose Receiver (DEXCOM G6 RECEIVER) DEVI 1 Device by Does not apply route 3 (three) times daily. E11.69 (Patient not taking: Reported on  12/31/2023)   Continuous Glucose Sensor (DEXCOM G6 SENSOR) MISC 1 Device by Does not apply route 3 (three) times daily. E11.69 (Patient not taking: Reported on 12/31/2023)   Continuous Glucose Transmitter (DEXCOM G6 TRANSMITTER) MISC 1 Device by Does not apply route 3 (three) times daily. E11.69 (Patient not taking: Reported on 12/31/2023)   No facility-administered encounter medications on file as of 12/31/2023.    Allergies as of 12/31/2023 - Review Complete 12/31/2023  Allergen Reaction Noted   Peanut-containing drug products Anaphylaxis 07/03/2007   Sulfonamide derivatives Anaphylaxis 07/03/2007   Amlodipine  Swelling and Other (See Comments) 08/23/2014   Eliquis  [apixaban ] Other (See Comments) 05/23/2017   Lisinopril  Cough 07/25/2014   Xarelto  [rivaroxaban ] Other (See Comments) 04/01/2017    Past Medical History:  Diagnosis Date   Age-related macular degeneration, dry, both eyes    Allergic    "24/7; 365  days/year; I'm allergic to pollens, dust, all southern grasses/trees, mold, mildue, cats, dogs" (01/07/2017)   Anal fissure    Asthma    sees Dr. Elverna Hamman    Benign prostatic hypertrophy    (sees Dr. Verdel Gitelman   CAD (coronary artery disease)    a. s/p CABG 2007. b. Cath 08/2016 - 4/4 patent grafts.   Carotid bruit    carotid u/s 10/10: 0.39% bilaterally   Chronic combined systolic and diastolic CHF (congestive heart failure) (HCC)    Complication of anesthesia 1980s   "w/anal cyst OR, he gave me a saddle block then put a narcotic in spinal cord; had a severe reaction to that" (01/07/2017)   Congestive heart failure (CHF) [facility])    ED (erectile dysfunction)    Family history of adverse reaction to anesthesia    "daughter wakes up during OR" (01/07/2017)   GERD (gastroesophageal reflux disease)    Gout    HTN (hypertension)    Hx of colonic polyps    (sees Dr. Elvin Hammer)   Hyperlipidemia    Hypothyroidism    Moderate to severe aortic stenosis    a. s/p TAVR 02/2017.   Myocardial  infarction [facility]) ~ 2000   Obesity    Osteoarthritis    "was in my knees, hands" (01/07/2017 )   PAF (paroxysmal atrial fibrillation) (HCC)    a. documented post TAVR.   Precancerous skin lesion    (sees Dr. 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   29 mm Edwards Sapien 3 transcatheter heart valve placed via percutaneous right transfemoral approach    Past Surgical History:  Procedure Laterality Date   ANUS SURGERY     "opened it back up cause it wouldn't heal; wound up w/a fissure" (01/07/2017)   BIV ICD INSERTION CRT-D N/A 05/02/2017   Procedure: BIV ICD INSERTION CRT-D;  Surgeon: Verona Goodwill, MD;  Location: [facility] INVASIVE CV LAB;  Service: Cardiovascular;  Laterality: N/A;   CARDIAC CATHETERIZATION  10/29/2005   CARDIAC CATHETERIZATION N/A 08/27/2016   Procedure: Right/Left Heart Cath and Coronary/Graft Angiography;  Surgeon: Odie Benne, MD;  Location: [facility] INVASIVE CV LAB;  Service: Cardiovascular;  Laterality: N/A;   CARDIOVERSION N/A 04/24/2021   Procedure: CARDIOVERSION;  Surgeon: Elmyra Haggard, MD;  Location: [facility] ENDOSCOPY;  Service: Cardiovascular;  Laterality: N/A;   CARDIOVERSION N/A 11/08/2021   Procedure: CARDIOVERSION;  Surgeon: Hazle Lites, MD;  Location: [facility] ENDOSCOPY;  Service: Cardiovascular;  Laterality: N/A;   CARDIOVERSION N/A 02/06/2022   Procedure: CARDIOVERSION;  Surgeon: Loyde Rule, MD;  Location: [facility] ENDOSCOPY;  Service: Cardiovascular;  Laterality: N/A;   CATARACT EXTRACTION W/ INTRAOCULAR LENS  IMPLANT, BILATERAL Bilateral    CATARACT EXTRACTION, BILATERAL  2012   COLONOSCOPY  06/30/2008   no repeats needed    COLONOSCOPY     had 3 or 4 in the past    CORONARY ARTERY BYPASS GRAFT  2007   "CABG X4"   CYST EXCISION PERINEAL  1980s   HAMMER TOE SURGERY Bilateral    ICD GENERATOR CHANGEOUT N/A 05/30/2023   Procedure: ICD GENERATOR CHANGEOUT;  Surgeon: Verona Goodwill, MD;  Location: [facility] INVASIVE CV LAB;  Service: Cardiovascular;  Laterality: N/A;   JOINT REPLACEMENT     KNEE ARTHROPLASTY  07/30/2011   Procedure: COMPUTER ASSISTED TOTAL KNEE ARTHROPLASTY;  Surgeon: Jasmine Mesi;  Location: [facility] OR;  Service:  Orthopedics;  Laterality: Left;  left total knee arthroplasty   MASTECTOMY SUBCUTANEOUS Bilateral    MULTIPLE TOOTH EXTRACTIONS     ORIF FINGER / THUMB FRACTURE Right ~ 1980   "repair of thumb injury"   PROSTATE BIOPSY     REPLACEMENT TOTAL KNEE BILATERAL Bilateral 2012   TEE WITHOUT CARDIOVERSION N/A 03/04/2017   Procedure: TRANSESOPHAGEAL ECHOCARDIOGRAM (TEE);  Surgeon: Odie Benne, MD;  Location: [facility] OR;  Service: Open Heart Surgery;  Laterality: N/A;   TOTAL KNEE REVISION Right 05/18/2019   Procedure: RIGHT PATELLA REVISION/REMOVAL;  Surgeon: Jasmine Mesi, MD;  Location: [facility] OR;  Service: Orthopedics;  Laterality: Right;   TRANSCATHETER AORTIC VALVE REPLACEMENT, TRANSFEMORAL N/A 03/04/2017   Procedure: TRANSCATHETER AORTIC VALVE REPLACEMENT, TRANSFEMORAL;  Surgeon: Odie Benne, MD;  Location: [facility] OR;  Service: Open Heart Surgery;  Laterality: N/A;    Family History  Problem Relation Age of Onset   Heart attack Father 22   Allergic rhinitis Father    Asthma Father    Heart failure Mother 40   Uterine cancer Mother    Breast cancer Mother    Colon cancer Neg Hx    Esophageal cancer Neg Hx     Social History   Socioeconomic History   Marital status: Married    Spouse name: Not on file   Number of children: 3   Years of education: Not on file   Highest education level: Not on file  Occupational History   Occupation: Product/process development scientist    Comment: builds malls  Tobacco Use   Smoking status: Former    Current packs/day: 0.00    Average packs/day: 3.5 packs/day for 13.0 years (45.5 ttl pk-yrs)    Types: Cigarettes    Start date: 18    Quit date: 1963    Years since quitting: 62.3   Smokeless  tobacco: Never   Tobacco comments:    Former smoker 04/19/21  Vaping Use   Vaping status: Never Used  Substance and Sexual Activity   Alcohol use: Not Currently    Alcohol/week: 7.0 standard drinks of alcohol    Types: 7 Cans of beer per week    Comment: 1 beer daily after 5pm (04/19/21)   Drug use: No   Sexual activity: Never  Other Topics Concern   Not on file  Social History Narrative   FH of CAD, Male 1st degree relative less than age 12.   Social Drivers of Corporate investment banker Strain: Low Risk  (03/31/2023)   Overall Financial Resource Strain (CARDIA)    Difficulty of Paying Living Expenses: Not very hard  Food Insecurity: No Food Insecurity (11/18/2023)   Hunger Vital Sign    Worried About Running Out of Food in the Last Year: Never true    Ran Out of Food in the Last Year: Never true  Transportation Needs: No Transportation Needs (11/18/2023)   PRAPARE - Administrator, Civil Service (Medical): No    Lack of Transportation (Non-Medical): No  Physical Activity: Sufficiently Active (03/31/2023)   Exercise Vital Sign    Days of Exercise per Week: 7 days    Minutes of Exercise per Session: 30 min  Stress: No Stress Concern Present (03/31/2023)   [employer] of Occupational Health - Occupational Stress Questionnaire    Feeling of Stress : Not at all  Social Connections: Socially Integrated (11/18/2023)   Social Connection and Isolation Panel [NHANES]    Frequency of Communication with Friends and  Family: Three times a week    Frequency of Social Gatherings with Friends and Family: Three times a week    Attends Religious Services: More than 4 times per year    Active Member of Clubs or Organizations: Yes    Attends Banker Meetings: More than 4 times per year    Marital Status: Married  Catering manager Violence: Not At Risk (11/17/2023)   Humiliation, Afraid, Rape, and Kick questionnaire    Fear of Current or Ex-Partner: No    Emotionally  Abused: No    Physically Abused: No    Sexually Abused: No    Review of Systems  Constitutional:  Positive for fatigue.  Respiratory:  Positive for cough and shortness of breath.   Psychiatric/Behavioral:  Positive for sleep disturbance.     Vitals:   12/31/23 0850  BP: 106/69  Pulse: 92  SpO2: 92%     Physical Exam Constitutional:      Appearance: He is obese.  HENT:     Head: Normocephalic.     Mouth/Throat:     Mouth: Mucous membranes are moist.  Eyes:     General: No scleral icterus. Cardiovascular:     Rate and Rhythm: Normal rate and regular rhythm.     Heart sounds: No murmur heard.    No friction rub.  Pulmonary:     Effort: No respiratory distress.     Breath sounds: No stridor. No wheezing or rhonchi.  Musculoskeletal:     Cervical back: No rigidity or tenderness.  Neurological:     Mental Status: He is alert.  Psychiatric:        Mood and Affect: Mood normal.      Data Reviewed: Records reviewed including recent visit with Dr. Kieran Pellet for evaluation of sleep apnea Most recent cardiology visit reviewed  Recent hospitalization record in March reviewed  Assessment:  Shortness of breath is likely multifactorial Deconditioning likely playing a role He does have a history of heart failure with reduced ejection fraction which is being optimized  Recent hospitalization with significant deconditioning  Exercise desaturations  Atrial fibrillation  Coronary artery disease  Aortic stenosis s/p intervention  Plan/Recommendations:  Obtain chest x-ray today  Exercise oximetry  Follow-up with your sleep study  Graded activities as tolerated  Try and ensure that you continue with physical therapy as tolerated  Continue to optimize treatment for heart failure  Follow-up in 6 to 8 weeks  Was ambulated in the office today and did desaturate with exercise Requires 4 L of oxygen via portable oxygen concentrator, 2 L of continuous  flow    Myer Artis MD [facility] 12/31/2023, 8:52 AM  CC: Mast, Man X, NP  Carin Charleston, CMA Wed Dec 31, 2023 11:32 AM   SATURATION QUALIFICATIONS: (This note is used to comply with regulatory documentation for home oxygen)   Patient Saturations on Room Air at Rest = 96%   Patient Saturations on [employer] while Ambulating = 86%   Patient Saturations on 2 Liters of continuous and 4 Liters POC oxygen while Ambulating = 97%   Please briefly explain why patient needs home oxygen: Pt qualified for 2L continuous and 2L POC to obtain sats above 88-90% Qualification walk 12/31/23 Melodee Spruce CMA

## 2023-12-31 NOTE — Telephone Encounter (Signed)
   I see referral order was sent to a different DME beside [facility] can you advise on whether he can use [facility] looks like order is waiting to be signed also.  Copied from CRM [id number]. Topic: Clinical - Medication Question >> Dec 31, 2023 12:48 PM Eveleen Hinds B wrote: Reason for CRM: Patient daughter [name] [phone] called. Patient  saw Dr Gaynell Keeler today. Daughter would like a call regarding ordering his oxygen. Home where patient stays uses oxygen from [facility] and wanted to know if this is the supply Dr Gaynell Keeler will place order. Please call.

## 2024-01-01 DIAGNOSIS — M6281 Muscle weakness (generalized): Secondary | ICD-10-CM | POA: Diagnosis not present

## 2024-01-01 DIAGNOSIS — R2689 Other abnormalities of gait and mobility: Secondary | ICD-10-CM | POA: Diagnosis not present

## 2024-01-01 DIAGNOSIS — R1312 Dysphagia, oropharyngeal phase: Secondary | ICD-10-CM | POA: Diagnosis not present

## 2024-01-01 DIAGNOSIS — R296 Repeated falls: Secondary | ICD-10-CM | POA: Diagnosis not present

## 2024-01-01 DIAGNOSIS — R29898 Other symptoms and signs involving the musculoskeletal system: Secondary | ICD-10-CM | POA: Diagnosis not present

## 2024-01-01 DIAGNOSIS — R2681 Unsteadiness on feet: Secondary | ICD-10-CM | POA: Diagnosis not present

## 2024-01-02 DIAGNOSIS — M6281 Muscle weakness (generalized): Secondary | ICD-10-CM | POA: Diagnosis not present

## 2024-01-02 DIAGNOSIS — R29898 Other symptoms and signs involving the musculoskeletal system: Secondary | ICD-10-CM | POA: Diagnosis not present

## 2024-01-02 DIAGNOSIS — R2689 Other abnormalities of gait and mobility: Secondary | ICD-10-CM | POA: Diagnosis not present

## 2024-01-02 DIAGNOSIS — R2681 Unsteadiness on feet: Secondary | ICD-10-CM | POA: Diagnosis not present

## 2024-01-02 DIAGNOSIS — R1312 Dysphagia, oropharyngeal phase: Secondary | ICD-10-CM | POA: Diagnosis not present

## 2024-01-02 DIAGNOSIS — R296 Repeated falls: Secondary | ICD-10-CM | POA: Diagnosis not present

## 2024-01-05 ENCOUNTER — Encounter: Payer: Self-pay | Admitting: Nurse Practitioner

## 2024-01-05 DIAGNOSIS — R2689 Other abnormalities of gait and mobility: Secondary | ICD-10-CM | POA: Diagnosis not present

## 2024-01-05 DIAGNOSIS — R296 Repeated falls: Secondary | ICD-10-CM | POA: Diagnosis not present

## 2024-01-05 DIAGNOSIS — R2681 Unsteadiness on feet: Secondary | ICD-10-CM | POA: Diagnosis not present

## 2024-01-05 DIAGNOSIS — R1312 Dysphagia, oropharyngeal phase: Secondary | ICD-10-CM | POA: Diagnosis not present

## 2024-01-05 DIAGNOSIS — R29898 Other symptoms and signs involving the musculoskeletal system: Secondary | ICD-10-CM | POA: Diagnosis not present

## 2024-01-05 DIAGNOSIS — M6281 Muscle weakness (generalized): Secondary | ICD-10-CM | POA: Diagnosis not present

## 2024-01-06 DIAGNOSIS — R1312 Dysphagia, oropharyngeal phase: Secondary | ICD-10-CM | POA: Diagnosis not present

## 2024-01-06 DIAGNOSIS — R296 Repeated falls: Secondary | ICD-10-CM | POA: Diagnosis not present

## 2024-01-06 DIAGNOSIS — M6281 Muscle weakness (generalized): Secondary | ICD-10-CM | POA: Diagnosis not present

## 2024-01-06 DIAGNOSIS — R2681 Unsteadiness on feet: Secondary | ICD-10-CM | POA: Diagnosis not present

## 2024-01-06 DIAGNOSIS — R29898 Other symptoms and signs involving the musculoskeletal system: Secondary | ICD-10-CM | POA: Diagnosis not present

## 2024-01-06 DIAGNOSIS — R2689 Other abnormalities of gait and mobility: Secondary | ICD-10-CM | POA: Diagnosis not present

## 2024-01-07 DIAGNOSIS — R2689 Other abnormalities of gait and mobility: Secondary | ICD-10-CM | POA: Diagnosis not present

## 2024-01-07 DIAGNOSIS — R2681 Unsteadiness on feet: Secondary | ICD-10-CM | POA: Diagnosis not present

## 2024-01-07 DIAGNOSIS — R1312 Dysphagia, oropharyngeal phase: Secondary | ICD-10-CM | POA: Diagnosis not present

## 2024-01-07 DIAGNOSIS — R29898 Other symptoms and signs involving the musculoskeletal system: Secondary | ICD-10-CM | POA: Diagnosis not present

## 2024-01-07 DIAGNOSIS — R296 Repeated falls: Secondary | ICD-10-CM | POA: Diagnosis not present

## 2024-01-07 DIAGNOSIS — M6281 Muscle weakness (generalized): Secondary | ICD-10-CM | POA: Diagnosis not present

## 2024-01-08 ENCOUNTER — Ambulatory Visit: Admitting: Orthopedic Surgery

## 2024-01-08 ENCOUNTER — Encounter: Payer: Self-pay | Admitting: Internal Medicine

## 2024-01-08 DIAGNOSIS — R1312 Dysphagia, oropharyngeal phase: Secondary | ICD-10-CM | POA: Diagnosis not present

## 2024-01-08 DIAGNOSIS — R29898 Other symptoms and signs involving the musculoskeletal system: Secondary | ICD-10-CM | POA: Diagnosis not present

## 2024-01-08 DIAGNOSIS — R2681 Unsteadiness on feet: Secondary | ICD-10-CM | POA: Diagnosis not present

## 2024-01-08 DIAGNOSIS — R2689 Other abnormalities of gait and mobility: Secondary | ICD-10-CM | POA: Diagnosis not present

## 2024-01-08 DIAGNOSIS — M6281 Muscle weakness (generalized): Secondary | ICD-10-CM | POA: Diagnosis not present

## 2024-01-08 DIAGNOSIS — R296 Repeated falls: Secondary | ICD-10-CM | POA: Diagnosis not present

## 2024-01-09 DIAGNOSIS — M6281 Muscle weakness (generalized): Secondary | ICD-10-CM | POA: Diagnosis not present

## 2024-01-09 DIAGNOSIS — R2689 Other abnormalities of gait and mobility: Secondary | ICD-10-CM | POA: Diagnosis not present

## 2024-01-09 DIAGNOSIS — R2681 Unsteadiness on feet: Secondary | ICD-10-CM | POA: Diagnosis not present

## 2024-01-09 DIAGNOSIS — R1312 Dysphagia, oropharyngeal phase: Secondary | ICD-10-CM | POA: Diagnosis not present

## 2024-01-09 DIAGNOSIS — R296 Repeated falls: Secondary | ICD-10-CM | POA: Diagnosis not present

## 2024-01-09 DIAGNOSIS — R29898 Other symptoms and signs involving the musculoskeletal system: Secondary | ICD-10-CM | POA: Diagnosis not present

## 2024-01-12 ENCOUNTER — Non-Acute Institutional Stay (SKILLED_NURSING_FACILITY): Payer: Self-pay | Admitting: Sports Medicine

## 2024-01-12 ENCOUNTER — Encounter: Payer: Self-pay | Admitting: Sports Medicine

## 2024-01-12 DIAGNOSIS — I480 Paroxysmal atrial fibrillation: Secondary | ICD-10-CM

## 2024-01-12 DIAGNOSIS — J3089 Other allergic rhinitis: Secondary | ICD-10-CM | POA: Diagnosis not present

## 2024-01-12 DIAGNOSIS — M6281 Muscle weakness (generalized): Secondary | ICD-10-CM | POA: Diagnosis not present

## 2024-01-12 DIAGNOSIS — R2689 Other abnormalities of gait and mobility: Secondary | ICD-10-CM | POA: Diagnosis not present

## 2024-01-12 DIAGNOSIS — E039 Hypothyroidism, unspecified: Secondary | ICD-10-CM

## 2024-01-12 DIAGNOSIS — I10 Essential (primary) hypertension: Secondary | ICD-10-CM

## 2024-01-12 DIAGNOSIS — J9691 Respiratory failure, unspecified with hypoxia: Secondary | ICD-10-CM | POA: Diagnosis not present

## 2024-01-12 DIAGNOSIS — R29898 Other symptoms and signs involving the musculoskeletal system: Secondary | ICD-10-CM | POA: Diagnosis not present

## 2024-01-12 DIAGNOSIS — R2681 Unsteadiness on feet: Secondary | ICD-10-CM | POA: Diagnosis not present

## 2024-01-12 DIAGNOSIS — R1312 Dysphagia, oropharyngeal phase: Secondary | ICD-10-CM | POA: Diagnosis not present

## 2024-01-12 DIAGNOSIS — I509 Heart failure, unspecified: Secondary | ICD-10-CM

## 2024-01-12 DIAGNOSIS — R296 Repeated falls: Secondary | ICD-10-CM | POA: Diagnosis not present

## 2024-01-12 NOTE — Progress Notes (Signed)
 Provider:  Dr. Tye Gall Location:  [facility] Place of Service:   skilled care   PCP: Mast, Man X, NP Patient Care Team: Mast, Man X, NP as PCP - General (Internal Medicine) Odie Benne, MD as PCP - Cardiology (Cardiology) Verona Goodwill, MD as PCP - Electrophysiology (Cardiology) Christina Coyer, MD as Consulting Physician (Urology) Alver Austin, [facility] (Inactive) as Pharmacist (Pharmacist) Dema Filler, MD as Consulting Physician (Ophthalmology)  Extended Emergency Contact Information Primary Emergency Contact: [facility] Address: [address]          [place], [place] [address] [place] Phone: [phone] Relation: Daughter Secondary Emergency Contact: Bluford Burkitt Address: [address]          [place], [place] [address] [place] Home Phone: [phone] Mobile Phone: [phone] Relation: Daughter  Goals of care:  Advanced Directive information    12/30/2023   10:42 AM  Advanced Directives  Does Patient Have a Medical Advance Directive? Yes  Type of Estate agent of [place];Living will  Does patient want to make changes to medical advance directive? No - Patient declined  Copy of Healthcare Power of Attorney in Chart? Yes - validated most recent copy scanned in chart (See row information)     Allergies  Allergen Reactions   Peanut-Containing Drug Products Anaphylaxis   Sulfonamide Derivatives Anaphylaxis   Amlodipine  Swelling and Other (See Comments)    Swelling in ankles   Eliquis  [Apixaban ] Other (See Comments)    Back/hip pain   Lisinopril  Cough   Xarelto  [Rivaroxaban ] Other (See Comments)    Back/hip pain    No chief complaint on file.      History of Present Illness  89 year old male with a past medical history of gout, A-fib, hyperlipidemia, CAD, CHF, hypothyroidism, hypertension is seen today for discharge. Pt seen and examined in  his room, he is on supplemental o2 2 lit, able to speak in full sentences and does not appear to be in distress.  Pt c/o intermittent cough but says that his cough is improving. Pt is ambulating with a walker and is able to transfer independently.     Past Medical History:  Diagnosis Date   Age-related macular degeneration, dry, both eyes    Allergic    "24/7; 365 days/year; I'm allergic to pollens, dust, all southern grasses/trees, mold, mildue, cats, dogs" (01/07/2017)   Anal fissure    Asthma    sees Dr. Elverna Hamman    Benign prostatic hypertrophy    (sees Dr. Verdel Gitelman   CAD (coronary artery disease)    a. s/p CABG 2007. b. Cath 08/2016 - 4/4 patent grafts.   Carotid bruit    carotid u/s 10/10: 0.39% bilaterally   Chronic combined systolic and diastolic CHF (congestive heart failure) (HCC)    Complication of anesthesia 1980s   "w/anal cyst OR, he gave me a saddle block then put a narcotic in spinal cord; had a severe reaction to that" (01/07/2017)   Congestive heart failure (CHF) [facility])    ED (erectile dysfunction)    Family history of adverse reaction to anesthesia    "daughter wakes up during OR" (01/07/2017)   GERD (gastroesophageal reflux disease)    Gout    HTN (hypertension)    Hx of colonic polyps    (sees Dr. Elvin Hammer)   Hyperlipidemia    Hypothyroidism    Moderate to severe aortic stenosis    a. s/p TAVR 02/2017.   Myocardial infarction (HCC) ~  2000   Obesity    Osteoarthritis    "was in my knees, hands" (01/07/2017 )   PAF (paroxysmal atrial fibrillation) (HCC)    a. documented post TAVR.   Precancerous skin lesion    (sees Dr. 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   29 mm Edwards Sapien 3 transcatheter heart valve placed via percutaneous right transfemoral approach    Past Surgical History:  Procedure Laterality Date   ANUS SURGERY     "opened it back up cause it wouldn't  heal; wound up w/a fissure" (01/07/2017)   BIV ICD INSERTION CRT-D N/A 05/02/2017   Procedure: BIV ICD INSERTION CRT-D;  Surgeon: Verona Goodwill, MD;  Location: [facility] INVASIVE CV LAB;  Service: Cardiovascular;  Laterality: N/A;   CARDIAC CATHETERIZATION  10/29/2005   CARDIAC CATHETERIZATION N/A 08/27/2016   Procedure: Right/Left Heart Cath and Coronary/Graft Angiography;  Surgeon: Odie Benne, MD;  Location: [facility] INVASIVE CV LAB;  Service: Cardiovascular;  Laterality: N/A;   CARDIOVERSION N/A 04/24/2021   Procedure: CARDIOVERSION;  Surgeon: Elmyra Haggard, MD;  Location: [facility] ENDOSCOPY;  Service: Cardiovascular;  Laterality: N/A;   CARDIOVERSION N/A 11/08/2021   Procedure: CARDIOVERSION;  Surgeon: Hazle Lites, MD;  Location: [facility] ENDOSCOPY;  Service: Cardiovascular;  Laterality: N/A;   CARDIOVERSION N/A 02/06/2022   Procedure: CARDIOVERSION;  Surgeon: Loyde Rule, MD;  Location: [facility] ENDOSCOPY;  Service: Cardiovascular;  Laterality: N/A;   CATARACT EXTRACTION W/ INTRAOCULAR LENS  IMPLANT, BILATERAL Bilateral    CATARACT EXTRACTION, BILATERAL  2012   COLONOSCOPY  06/30/2008   no repeats needed    COLONOSCOPY     had 3 or 4 in the past    CORONARY ARTERY BYPASS GRAFT  2007   "CABG X4"   CYST EXCISION PERINEAL  1980s   HAMMER TOE SURGERY Bilateral    ICD GENERATOR CHANGEOUT N/A 05/30/2023   Procedure: ICD GENERATOR CHANGEOUT;  Surgeon: Verona Goodwill, MD;  Location: [facility] INVASIVE CV LAB;  Service: Cardiovascular;  Laterality: N/A;   JOINT REPLACEMENT     KNEE ARTHROPLASTY  07/30/2011   Procedure: COMPUTER ASSISTED TOTAL KNEE ARTHROPLASTY;  Surgeon: Jasmine Mesi;  Location: [facility] OR;  Service: Orthopedics;  Laterality: Left;  left total knee arthroplasty   MASTECTOMY SUBCUTANEOUS Bilateral    MULTIPLE TOOTH EXTRACTIONS     ORIF FINGER / THUMB FRACTURE Right ~ 1980   "repair of thumb injury"   PROSTATE BIOPSY     REPLACEMENT TOTAL KNEE BILATERAL Bilateral 2012   TEE WITHOUT CARDIOVERSION  N/A 03/04/2017   Procedure: TRANSESOPHAGEAL ECHOCARDIOGRAM (TEE);  Surgeon: Odie Benne, MD;  Location: [facility] OR;  Service: Open Heart Surgery;  Laterality: N/A;   TOTAL KNEE REVISION Right 05/18/2019   Procedure: RIGHT PATELLA REVISION/REMOVAL;  Surgeon: Jasmine Mesi, MD;  Location: [facility] OR;  Service: Orthopedics;  Laterality: Right;   TRANSCATHETER AORTIC VALVE REPLACEMENT, TRANSFEMORAL N/A 03/04/2017   Procedure: TRANSCATHETER AORTIC VALVE REPLACEMENT, TRANSFEMORAL;  Surgeon: Odie Benne, MD;  Location: [facility] OR;  Service: Open Heart Surgery;  Laterality: N/A;      reports that he quit smoking about 62 years ago. His smoking use included cigarettes. He started smoking about 75 years ago. He has a 45.5 pack-year smoking history. He has never used smokeless tobacco. He reports that he does not currently use alcohol after a past usage of about 7.0 standard drinks  of alcohol per week. He reports that he does not use drugs. Social History   Socioeconomic History   Marital status: Married    Spouse name: Not on file   Number of children: 3   Years of education: Not on file   Highest education level: Not on file  Occupational History   Occupation: Product/process development scientist    Comment: builds malls  Tobacco Use   Smoking status: Former    Current packs/day: 0.00    Average packs/day: 3.5 packs/day for 13.0 years (45.5 ttl pk-yrs)    Types: Cigarettes    Start date: 30    Quit date: 1963    Years since quitting: 62.4   Smokeless tobacco: Never   Tobacco comments:    Former smoker 04/19/21  Vaping Use   Vaping status: Never Used  Substance and Sexual Activity   Alcohol use: Not Currently    Alcohol/week: 7.0 standard drinks of alcohol    Types: 7 Cans of beer per week    Comment: 1 beer daily after 5pm (04/19/21)   Drug use: No   Sexual activity: Never  Other Topics Concern   Not on file  Social History Narrative   FH of CAD, Male 1st degree relative less than age  52.   Social Drivers of Corporate investment banker Strain: Low Risk  (03/31/2023)   Overall Financial Resource Strain (CARDIA)    Difficulty of Paying Living Expenses: Not very hard  Food Insecurity: No Food Insecurity (11/18/2023)   Hunger Vital Sign    Worried About Running Out of Food in the Last Year: Never true    Ran Out of Food in the Last Year: Never true  Transportation Needs: No Transportation Needs (11/18/2023)   PRAPARE - Administrator, Civil Service (Medical): No    Lack of Transportation (Non-Medical): No  Physical Activity: Sufficiently Active (03/31/2023)   Exercise Vital Sign    Days of Exercise per Week: 7 days    Minutes of Exercise per Session: 30 min  Stress: No Stress Concern Present (03/31/2023)   [employer] of Occupational Health - Occupational Stress Questionnaire    Feeling of Stress : Not at all  Social Connections: Socially Integrated (11/18/2023)   Social Connection and Isolation Panel [NHANES]    Frequency of Communication with Friends and Family: Three times a week    Frequency of Social Gatherings with Friends and Family: Three times a week    Attends Religious Services: More than 4 times per year    Active Member of Clubs or Organizations: Yes    Attends Banker Meetings: More than 4 times per year    Marital Status: Married  Catering manager Violence: Not At Risk (11/17/2023)   Humiliation, Afraid, Rape, and Kick questionnaire    Fear of Current or Ex-Partner: No    Emotionally Abused: No    Physically Abused: No    Sexually Abused: No   Functional Status Survey:    Allergies  Allergen Reactions   Peanut-Containing Drug Products Anaphylaxis   Sulfonamide Derivatives Anaphylaxis   Amlodipine  Swelling and Other (See Comments)    Swelling in ankles   Eliquis  [Apixaban ] Other (See Comments)    Back/hip pain   Lisinopril  Cough   Xarelto  [Rivaroxaban ] Other (See Comments)    Back/hip pain    Pertinent  Health  Maintenance Due  Topic Date Due   FOOT EXAM  Never done   OPHTHALMOLOGY EXAM  01/07/2024  INFLUENZA VACCINE  03/26/2024   HEMOGLOBIN A1C  04/12/2024    Medications: Outpatient Encounter Medications as of 01/12/2024  Medication Sig   albuterol  (VENTOLIN  HFA) 108 (90 Base) MCG/ACT inhaler Inhale 2 puffs into the lungs every 4 (four) hours as needed for wheezing or shortness of breath. (Patient not taking: Reported on 12/31/2023)   allopurinol  (ZYLOPRIM ) 100 MG tablet Take 1 tablet (100 mg total) by mouth daily.   amiodarone  (PACERONE ) 200 MG tablet Take 1 tablet (200 mg total) by mouth daily.   aspirin  81 MG tablet Take 1 tablet (81 mg total) by mouth daily.   atorvastatin  (LIPITOR) 20 MG tablet TAKE ONE TABLET ONCE DAILY   Baclofen 5 MG TABS Take 5 mg by mouth every 12 (twelve) hours as needed.   benzonatate  (TESSALON ) 100 MG capsule Take 100 mg by mouth 2 (two) times daily.   calcium  carbonate (OS-CAL) 1250 (500 Ca) MG chewable tablet Chew 1 tablet by mouth daily.   cetirizine (ZYRTEC) 10 MG tablet Take 10 mg by mouth 2 (two) times daily. As needed   colchicine  0.6 MG tablet Take 1 tablet (0.6 mg total) by mouth every 6 (six) hours as needed (gout).   Continuous Glucose Receiver (DEXCOM G6 RECEIVER) DEVI 1 Device by Does not apply route 3 (three) times daily. E11.69 (Patient not taking: Reported on 12/31/2023)   Continuous Glucose Sensor (DEXCOM G6 SENSOR) MISC 1 Device by Does not apply route 3 (three) times daily. E11.69 (Patient not taking: Reported on 12/31/2023)   Continuous Glucose Transmitter (DEXCOM G6 TRANSMITTER) MISC 1 Device by Does not apply route 3 (three) times daily. E11.69 (Patient not taking: Reported on 12/31/2023)   dabigatran  (PRADAXA ) 150 MG CAPS capsule TAKE (1) CAPSULE TWICE DAILY.   docusate sodium  (COLACE) 100 MG capsule Take 100 mg by mouth 2 (two) times daily.   fluticasone  (FLONASE ) 50 MCG/ACT nasal spray Place 2 sprays into both nostrils daily.   insulin  aspart  (NOVOLOG ) 100 UNIT/ML injection Inject 4 Units into the skin 3 (three) times daily before meals. Inject as per sliding scale: if 141 - 180 = 4 units Inject per sliding scale; 181 - 220 = 6 units Inject per sliding scale; 221 - 260 = 8 units Inject per sliding scale; 261 - 300 = 10 units Inject per sliding scale; 301 - 350 = 12 units Inject per sliding scale; 351 - 400= 14 units Inject per sliding scale; 401+ = 16 units Inject per sliding scale, subcutaneously three times a day   isosorbide  mononitrate (IMDUR ) 30 MG 24 hr tablet Take 1 tablet (30 mg total) by mouth daily.   ketotifen  (ZADITOR ) 0.025 % ophthalmic solution Place 1 drop into both eyes daily as needed (for itching).   levothyroxine  (SYNTHROID ) 175 MCG tablet Take 1 tablet (175 mcg total) by mouth daily before breakfast.   levothyroxine  (SYNTHROID ) 25 MCG tablet Take 25 mcg by mouth daily.   levothyroxine  (SYNTHROID ) 75 MCG tablet Take 75 mcg by mouth daily. Take 2 tablets once daily   magic mouthwash SOLN Take 5 mLs by mouth as directed. Suspension contains equal amounts of Maalox Extra Strength, nystatin, and diphenhydramine . RX'ed by dentist   methocarbamol  (ROBAXIN ) 500 MG tablet Take 1 tablet (500 mg total) by mouth 4 (four) times daily as needed for muscle spasms.   metoCLOPramide  (REGLAN ) 10 MG tablet Take 10 mg by mouth daily.   metolazone  (ZAROXOLYN ) 2.5 MG tablet Take 1 tablet (2.5 mg total) by mouth once a week every Saturday  metoprolol  succinate (TOPROL -XL) 50 MG 24 hr tablet Take 1 tablet (50 mg total) by mouth daily. Take with or immediately following a meal.   montelukast  (SINGULAIR ) 10 MG tablet TAKE ONE TABLET BY MOUTH ONCE DAILY IN THE EVENING   Multiple Minerals-Vitamins (CAL MAG ZINC +D3 PO) Take 2 tablets by mouth in the morning. 500 mg / 7 mg / 25 mcg   Multiple Vitamins-Minerals (MULTI-VITAMIN GUMMIES) CHEW Chew 2 tablets by mouth See admin instructions. Chew 2 gummies by mouth in the morning   Multiple  Vitamins-Minerals (PRESERVISION AREDS 2) CAPS Take 1 capsule by mouth in the morning and at bedtime.   nitrofurantoin (MACRODANTIN) 100 MG capsule Take 100 mg by mouth 2 (two) times daily.   nystatin (MYCOSTATIN/NYSTOP) powder Apply 1 Application topically 2 (two) times daily.   pantoprazole  (PROTONIX ) 40 MG tablet Take 1 tablet (40 mg total) by mouth daily.   polyethylene glycol powder (GLYCOLAX/MIRALAX) 17 GM/SCOOP powder Take by mouth every 12 (twelve) hours as needed for moderate constipation.   potassium chloride  SA (KLOR-CON  M) 20 MEQ tablet Take 1 tablet (20 mEq total) by mouth 2 (two) times daily.   sacubitril-valsartan (ENTRESTO ) 24-26 MG TAKE 1/2 TABLET TWICE DAILY   silver  sulfADIAZINE  (SILVADENE ) 1 % cream Apply 1 Application topically 2 (two) times daily.   spironolactone  (ALDACTONE ) 25 MG tablet Take 1 tablet (25 mg total) by mouth daily.   torsemide  (DEMADEX ) 20 MG tablet Take 2 tablets (40 mg total) by mouth daily.   triamcinolone  cream (KENALOG ) 0.1 % Apply 1 application  topically daily as needed for dry skin (affected areas).   valACYclovir  (VALTREX ) 500 MG tablet Take 500 mg by mouth 2 (two) times daily as needed (cold sores).   venlafaxine  XR (EFFEXOR  XR) 37.5 MG 24 hr capsule Take 1 capsule (37.5 mg total) by mouth daily with breakfast.   No facility-administered encounter medications on file as of 01/12/2024.    Review of Systems  Constitutional:  Negative for fever.  Respiratory:  Positive for cough and shortness of breath. Negative for wheezing.   Cardiovascular:  Positive for leg swelling. Negative for chest pain and palpitations.  Gastrointestinal:  Negative for abdominal distention, abdominal pain, blood in stool, constipation, diarrhea, nausea and vomiting.  Genitourinary:  Negative for dysuria.  Neurological:  Negative for dizziness.    There were no vitals filed for this visit. There is no height or weight on file to calculate BMI. Physical  Exam Constitutional:      Appearance: Normal appearance.  HENT:     Head: Normocephalic and atraumatic.  Cardiovascular:     Rate and Rhythm: Normal rate and regular rhythm.     Pulses: Normal pulses.     Heart sounds: Normal heart sounds.  Pulmonary:     Effort: No respiratory distress.     Breath sounds: No stridor. Rales (rt lung base) present. No wheezing.  Abdominal:     General: Bowel sounds are normal. There is no distension.     Palpations: Abdomen is soft.     Tenderness: There is no abdominal tenderness. There is no right CVA tenderness or guarding.  Musculoskeletal:        General: Swelling (wearing compression stockings) present.  Neurological:     Mental Status: He is alert. Mental status is at baseline.     Sensory: No sensory deficit.     Motor: No weakness.    Labs reviewed: Basic Metabolic Panel: Recent Labs    06/30/23 1616 07/01/23 0355  07/02/23 0415 11/25/23 0728 11/26/23 0417 11/27/23 0410 12/16/23 0000 12/23/23 1208  NA  --  140   < > 134* 134* 134* 134* 132*  K  --  4.1   < > 4.0 3.8 3.8 3.9 3.7  CL  --  106   < > 98 97* 96* 92* 93*  CO2  --  24   < > 27 27 29  32* 26  GLUCOSE  --  125*   < > 87 94 106*  --  92  BUN  --  14   < > 41* 43* 46* 25* 20  CREATININE  --  1.30*   < > 1.13 1.11 1.32* 1.3 1.58*  CALCIUM   --  7.8*   < > 7.8* 7.8* 7.7* 7.8* 8.1*  MG 2.7*  --    < > 2.7* 2.6* 2.5*  --   --   PHOS 2.3* 3.3  --   --   --   --   --   --    < > = values in this interval not displayed.   Liver Function Tests: Recent Labs    11/17/23 1211 11/18/23 1918 11/19/23 1844 11/20/23 1317 12/16/23 0000  AST 53* 76*  --  93* 33  ALT 38 45*  --  70* 33  ALKPHOS 135* 147*  --  151* 160*  BILITOT 1.7* 0.8  --  0.8  --   PROT 6.9 6.7 7.3 6.5  --   ALBUMIN  2.2* 2.1*  --  2.1* 2.4*   No results for input(s): "LIPASE", "AMYLASE" in the last 8760 hours. No results for input(s): "AMMONIA" in the last 8760 hours. CBC: Recent Labs    11/25/23 0422  11/26/23 0417 11/27/23 0410 12/16/23 0000  WBC 19.5* 19.1* 20.4* 9.7  NEUTROABS 16.4* 16.4* 16.9* 5,645.00  HGB 13.4 13.4 13.3 12.8*  HCT 41.5 43.0 41.9 41  MCV 86.6 86.2 84.5  --   PLT 365 345 352 211   Cardiac Enzymes: Recent Labs    11/22/23 0231  CKTOTAL 29*  CKMB 4.0   BNP: Invalid input(s): "POCBNP" CBG: Recent Labs    11/26/23 2149 11/27/23 0730 11/27/23 1146  GLUCAP 105* 88 130*    Procedures and Imaging Studies During Stay: DG Chest 2 View Result Date: 01/01/2024 CLINICAL DATA:  Short of breath. EXAM: CHEST - 2 VIEW COMPARISON:  11/20/2023.  CT, 11/19/2023. FINDINGS: Stable changes from prior cardiac surgery. Cardiac silhouette is normal in size. Stable changes from an aortic valve replacement. Left anterior chest wall biventricular cardioverter-defibrillator is stable. No mediastinal or hilar masses. No evidence of adenopathy. Area lungs demonstrate prominent bronchovascular markings. There is additional opacity at the lung bases with suspected small effusions and atelectasis. No lung consolidation. No pneumothorax. Skeletal structures are demineralized, grossly intact. IMPRESSION: 1. Possible mild degree of congestive heart failure with prominent vascular markings and small effusions. No overt edema. No evidence of pneumonia. Electronically Signed   By: Amanda Jungling M.D.   On: 01/01/2024 11:06    Assessment and Plan Assessment & Plan  1. Chronic congestive heart failure, unspecified heart failure type (HCC) (Primary) Rt ling basal crackles Cont with spironolactone , torsemide , metolazone   2. Paroxysmal atrial fibrillation (HCC) No signs of bleeding Cont with amiodarone , pradax  3. Cont with Essential hypertension entresto , imdur , metoprolol   4. Non-seasonal allergic rhinitis due to other allergic trigger Ont with  singulair , flonase   5. Respiratory failure with hypoxia, unspecified chronicity (HCC) Pt on supplemental o2 2 lit  Followed with  pulmonology  6. Hypothyroidism, unspecified type Cont with synthyroid   Pt will be moving to Assisted living   35 min Total time spent for obtaining history,  performing a medically appropriate examination and evaluation, reviewing the tests,   documenting clinical information in the electronic or other health record,  discharge care coordination (not separately reported)

## 2024-01-13 DIAGNOSIS — R2681 Unsteadiness on feet: Secondary | ICD-10-CM | POA: Diagnosis not present

## 2024-01-13 DIAGNOSIS — R8271 Bacteriuria: Secondary | ICD-10-CM | POA: Diagnosis not present

## 2024-01-13 DIAGNOSIS — R2689 Other abnormalities of gait and mobility: Secondary | ICD-10-CM | POA: Diagnosis not present

## 2024-01-13 DIAGNOSIS — R1312 Dysphagia, oropharyngeal phase: Secondary | ICD-10-CM | POA: Diagnosis not present

## 2024-01-13 DIAGNOSIS — M6281 Muscle weakness (generalized): Secondary | ICD-10-CM | POA: Diagnosis not present

## 2024-01-13 DIAGNOSIS — R29898 Other symptoms and signs involving the musculoskeletal system: Secondary | ICD-10-CM | POA: Diagnosis not present

## 2024-01-13 DIAGNOSIS — R296 Repeated falls: Secondary | ICD-10-CM | POA: Diagnosis not present

## 2024-01-13 DIAGNOSIS — R300 Dysuria: Secondary | ICD-10-CM | POA: Diagnosis not present

## 2024-01-14 ENCOUNTER — Other Ambulatory Visit: Payer: Self-pay | Admitting: Internal Medicine

## 2024-01-14 ENCOUNTER — Ambulatory Visit: Attending: Sleep Medicine

## 2024-01-14 ENCOUNTER — Other Ambulatory Visit: Payer: Self-pay | Admitting: Family Medicine

## 2024-01-14 DIAGNOSIS — G4761 Periodic limb movement disorder: Secondary | ICD-10-CM | POA: Diagnosis not present

## 2024-01-14 DIAGNOSIS — G4736 Sleep related hypoventilation in conditions classified elsewhere: Secondary | ICD-10-CM | POA: Insufficient documentation

## 2024-01-14 DIAGNOSIS — G4733 Obstructive sleep apnea (adult) (pediatric): Secondary | ICD-10-CM | POA: Diagnosis not present

## 2024-01-14 DIAGNOSIS — R0683 Snoring: Secondary | ICD-10-CM | POA: Diagnosis not present

## 2024-01-14 NOTE — Progress Notes (Signed)
 ADVANCED HE[facility] FAILURE CLINIC NOTE  Primary Care: Mast, Man X, NP Primary Cardiologist: Dr. Abel Hoe EP: Dr. Rodolfo Clan HF Cardiologist: Dr. Bruce Caper  HPI: Justin [name] is a 89 y.o. male with permanent atrial fibrillation, aortic stenosis s/p TAVR, medtronic BiV ICD in 2018 and after TAVR, CKD, CAD s/p CABG in 2007.  Has followed with Dr. Abel Hoe and Dr. Rodolfo Clan. S/p ICD generator changed 10/24. EF 40-45% at the time. Enrolled in iCM monitoring.  Admitted 3/25 for pneumonia and compressive atelectasis 2/2 COVID and flu infection c/b UTI . S/p thoracentesis 1L. EF 40-45% with mod reduced RV, normal AoV prosthetic. He was discharged to SNF.   Interval hx:  Today he returns for HF follow up with his daughter. Overall feeling fatigued. He walks on flat ground with his walker without dyspnea. Occasional dizziness with positional changes. Denies palpitations, abnormal bleeding, CP, edema, or PND/Orthopnea. Appetite ok. Taking all medications provided by facility. Lives at [facility], transitioning from SNF to ALF today! Has sleep study in progress. Typically wears compression hose.  SH: Lives in senior living facility with wife  Past Medical History:  Diagnosis Date   Age-related macular degeneration, dry, both eyes    Allergic    "24/7; 365 days/year; I'm allergic to pollens, dust, all southern grasses/trees, mold, mildue, cats, dogs" (01/07/2017)   Anal fissure    Asthma    sees Dr. Elverna Hamman    Benign prostatic hypertrophy    (sees Dr. Verdel Gitelman   CAD (coronary artery disease)    a. s/p CABG 2007. b. Cath 08/2016 - 4/4 patent grafts.   Carotid bruit    carotid u/s 10/10: 0.39% bilaterally   Chronic combined systolic and diastolic CHF (congestive heart failure) (HCC)    Complication of anesthesia 1980s   "w/anal cyst OR, he gave me a saddle block then put a narcotic in spinal cord; had a severe reaction to that" (01/07/2017)   Congestive heart failure (CHF) [facility])    ED (erectile  dysfunction)    Family history of adverse reaction to anesthesia    "daughter wakes up during OR" (01/07/2017)   GERD (gastroesophageal reflux disease)    Gout    HTN (hypertension)    Hx of colonic polyps    (sees Dr. Elvin Hammer)   Hyperlipidemia    Hypothyroidism    Moderate to severe aortic stenosis    a. s/p TAVR 02/2017.   Myocardial infarction [facility]) ~ 2000   Obesity    Osteoarthritis    "was in my knees, hands" (01/07/2017 )   PAF (paroxysmal atrial fibrillation) (HCC)    a. documented post TAVR.   Precancerous skin lesion    (sees Dr. Fleurette Humbles)   Prostate cancer [facility]) dx'd ~ 2014   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   29 mm Edwards Sapien 3 transcatheter heart valve placed via percutaneous right transfemoral approach    Current Outpatient Medications  Medication Sig Dispense Refill   albuterol  (VENTOLIN  HFA) 108 (90 Base) MCG/ACT inhaler Inhale 2 puffs into the lungs every 4 (four) hours as needed for wheezing or shortness of breath. (Patient not taking: Reported on 12/31/2023) 8 g 5   allopurinol  (ZYLOPRIM ) 100 MG tablet Take 1 tablet (100 mg total) by mouth daily. 90 tablet 3   amiodarone  (PACERONE ) 200 MG tablet Take 1 tablet (200 mg total) by mouth daily. 90 tablet 3   aspirin  81 MG tablet Take 1 tablet (81 mg total) by  mouth daily. 30 tablet 0   atorvastatin  (LIPITOR) 20 MG tablet TAKE ONE TABLET ONCE DAILY 90 tablet 2   Baclofen 5 MG TABS Take 5 mg by mouth every 12 (twelve) hours as needed.     benzonatate  (TESSALON ) 100 MG capsule Take 100 mg by mouth 2 (two) times daily.     calcium  carbonate (OS-CAL) 1250 (500 Ca) MG chewable tablet Chew 1 tablet by mouth daily.     cetirizine (ZYRTEC) 10 MG tablet Take 10 mg by mouth 2 (two) times daily. As needed     colchicine  0.6 MG tablet Take 1 tablet (0.6 mg total) by mouth every 6 (six) hours as needed (gout). 60 tablet 5   Continuous Glucose Receiver (DEXCOM G6 RECEIVER) DEVI 1  Device by Does not apply route 3 (three) times daily. E11.69 (Patient not taking: Reported on 12/31/2023) 1 each 11   Continuous Glucose Sensor (DEXCOM G6 SENSOR) MISC 1 Device by Does not apply route 3 (three) times daily. E11.69 (Patient not taking: Reported on 12/31/2023) 3 each 11   Continuous Glucose Transmitter (DEXCOM G6 TRANSMITTER) MISC 1 Device by Does not apply route 3 (three) times daily. E11.69 (Patient not taking: Reported on 12/31/2023) 1 each 11   dabigatran  (PRADAXA ) 150 MG CAPS capsule TAKE (1) CAPSULE TWICE DAILY. 180 capsule 2   docusate sodium  (COLACE) 100 MG capsule Take 100 mg by mouth 2 (two) times daily.     fluticasone  (FLONASE ) 50 MCG/ACT nasal spray Place 2 sprays into both nostrils daily.     insulin  aspart (NOVOLOG ) 100 UNIT/ML injection Inject 4 Units into the skin 3 (three) times daily before meals. Inject as per sliding scale: if 141 - 180 = 4 units Inject per sliding scale; 181 - 220 = 6 units Inject per sliding scale; 221 - 260 = 8 units Inject per sliding scale; 261 - 300 = 10 units Inject per sliding scale; 301 - 350 = 12 units Inject per sliding scale; 351 - 400= 14 units Inject per sliding scale; 401+ = 16 units Inject per sliding scale, subcutaneously three times a day     isosorbide  mononitrate (IMDUR ) 30 MG 24 hr tablet Take 1 tablet (30 mg total) by mouth daily. 90 tablet 3   ketotifen  (ZADITOR ) 0.025 % ophthalmic solution Place 1 drop into both eyes daily as needed (for itching).     levothyroxine  (SYNTHROID ) 175 MCG tablet Take 1 tablet (175 mcg total) by mouth daily before breakfast.     levothyroxine  (SYNTHROID ) 25 MCG tablet Take 25 mcg by mouth daily.     levothyroxine  (SYNTHROID ) 75 MCG tablet Take 75 mcg by mouth daily. Take 2 tablets once daily     magic mouthwash SOLN Take 5 mLs by mouth as directed. Suspension contains equal amounts of Maalox Extra Strength, nystatin, and diphenhydramine . RX'ed by dentist     methocarbamol  (ROBAXIN ) 500 MG tablet Take 1  tablet (500 mg total) by mouth 4 (four) times daily as needed for muscle spasms. 50 tablet 0   metoCLOPramide  (REGLAN ) 10 MG tablet Take 10 mg by mouth daily.     metolazone  (ZAROXOLYN ) 2.5 MG tablet Take 1 tablet (2.5 mg total) by mouth once a week every Saturday 12 tablet 3   metoprolol  succinate (TOPROL -XL) 50 MG 24 hr tablet Take 1 tablet (50 mg total) by mouth daily. Take with or immediately following a meal.     montelukast  (SINGULAIR ) 10 MG tablet TAKE ONE TABLET BY MOUTH ONCE DAILY IN THE EVENING  90 tablet 3   Multiple Minerals-Vitamins (CAL MAG ZINC +D3 PO) Take 2 tablets by mouth in the morning. 500 mg / 7 mg / 25 mcg     Multiple Vitamins-Minerals (MULTI-VITAMIN GUMMIES) CHEW Chew 2 tablets by mouth See admin instructions. Chew 2 gummies by mouth in the morning     Multiple Vitamins-Minerals (PRESERVISION AREDS 2) CAPS Take 1 capsule by mouth in the morning and at bedtime.     nitrofurantoin (MACRODANTIN) 100 MG capsule Take 100 mg by mouth 2 (two) times daily.     nystatin (MYCOSTATIN/NYSTOP) powder Apply 1 Application topically 2 (two) times daily.     pantoprazole  (PROTONIX ) 40 MG tablet Take 1 tablet (40 mg total) by mouth daily. 30 tablet 3   polyethylene glycol powder (GLYCOLAX/MIRALAX) 17 GM/SCOOP powder Take by mouth every 12 (twelve) hours as needed for moderate constipation.     potassium chloride  SA (KLOR-CON  M) 20 MEQ tablet Take 1 tablet (20 mEq total) by mouth 2 (two) times daily. 180 tablet 3   sacubitril-valsartan (ENTRESTO ) 24-26 MG TAKE 1/2 TABLET TWICE DAILY     silver  sulfADIAZINE  (SILVADENE ) 1 % cream Apply 1 Application topically 2 (two) times daily. 50 g 5   spironolactone  (ALDACTONE ) 25 MG tablet Take 1 tablet (25 mg total) by mouth daily. 30 tablet 9   torsemide  (DEMADEX ) 20 MG tablet Take 2 tablets (40 mg total) by mouth daily.     triamcinolone  cream (KENALOG ) 0.1 % Apply 1 application  topically daily as needed for dry skin (affected areas).     valACYclovir   (VALTREX ) 500 MG tablet Take 500 mg by mouth 2 (two) times daily as needed (cold sores).     venlafaxine  XR (EFFEXOR  XR) 37.5 MG 24 hr capsule Take 1 capsule (37.5 mg total) by mouth daily with breakfast. 30 capsule 1   No current facility-administered medications for this encounter.   Allergies  Allergen Reactions   Peanut-Containing Drug Products Anaphylaxis   Sulfonamide Derivatives Anaphylaxis   Amlodipine  Swelling and Other (See Comments)    Swelling in ankles   Eliquis  [Apixaban ] Other (See Comments)    Back/hip pain   Lisinopril  Cough   Xarelto  [Rivaroxaban ] Other (See Comments)    Back/hip pain   Social History   Socioeconomic History   Marital status: Married    Spouse name: Not on file   Number of children: 3   Years of education: Not on file   Highest education level: Not on file  Occupational History   Occupation: Product/process development scientist    Comment: builds malls  Tobacco Use   Smoking status: Former    Current packs/day: 0.00    Average packs/day: 3.5 packs/day for 13.0 years (45.5 ttl pk-yrs)    Types: Cigarettes    Start date: 74    Quit date: 1963    Years since quitting: 62.4   Smokeless tobacco: Never   Tobacco comments:    Former smoker 04/19/21  Vaping Use   Vaping status: Never Used  Substance and Sexual Activity   Alcohol use: Not Currently    Alcohol/week: 7.0 standard drinks of alcohol    Types: 7 Cans of beer per week    Comment: 1 beer daily after 5pm (04/19/21)   Drug use: No   Sexual activity: Never  Other Topics Concern   Not on file  Social History Narrative   FH of CAD, Male 1st degree relative less than age 31.   Social Drivers of Dispensing optician  Resource Strain: Low Risk  (03/31/2023)   Overall Financial Resource Strain (CARDIA)    Difficulty of Paying Living Expenses: Not very hard  Food Insecurity: No Food Insecurity (11/18/2023)   Hunger Vital Sign    Worried About Running Out of Food in the Last Year: Never true    Ran Out  of Food in the Last Year: Never true  Transportation Needs: No Transportation Needs (11/18/2023)   PRAPARE - Administrator, Civil Service (Medical): No    Lack of Transportation (Non-Medical): No  Physical Activity: Sufficiently Active (03/31/2023)   Exercise Vital Sign    Days of Exercise per Week: 7 days    Minutes of Exercise per Session: 30 min  Stress: No Stress Concern Present (03/31/2023)   [employer] of Occupational Health - Occupational Stress Questionnaire    Feeling of Stress : Not at all  Social Connections: Socially Integrated (11/18/2023)   Social Connection and Isolation Panel [NHANES]    Frequency of Communication with Friends and Family: Three times a week    Frequency of Social Gatherings with Friends and Family: Three times a week    Attends Religious Services: More than 4 times per year    Active Member of Clubs or Organizations: Yes    Attends Banker Meetings: More than 4 times per year    Marital Status: Married  Catering manager Violence: Not At Risk (11/17/2023)   Humiliation, Afraid, Rape, and Kick questionnaire    Fear of Current or Ex-Partner: No    Emotionally Abused: No    Physically Abused: No    Sexually Abused: No    Family History  Problem Relation Age of Onset   Heart attack Father 45   Allergic rhinitis Father    Asthma Father    Heart failure Mother 19   Uterine cancer Mother    Breast cancer Mother    Colon cancer Neg Hx    Esophageal cancer Neg Hx    Wt Readings from Last 3 Encounters:  01/16/24 113.4 kg (250 lb)  01/12/24 111.7 kg (246 lb 3.2 oz)  12/31/23 112.1 kg (247 lb 3.2 oz)   BP 112/72   Pulse (!) 105   Wt 113.4 kg (250 lb)   SpO2 94% Comment: Patient is on 2-liters of his oxygen.  BMI 40.35 kg/m   PHYSICAL EXAM: General:  NAD. No resp difficulty, walked into clinic with RW on oxygen, elderly HEENT: Normal Neck: Supple. JVP 10-12 Cor: irregular rate & rhythm. No rubs, gallops or  murmurs. Lungs: Clear Abdomen: Soft, obese, nontender, nondistended.  Extremities: No cyanosis, clubbing, rash, 1-2+ BLE edema to thighs Neuro: Alert & oriented x 3, moves all 4 extremities w/o difficulty. Affect pleasant.  DATA REVIEW  Device interrogation (personally reviewed): OptiVol up, thoracic impedence down, 100% AF, 1.9 hr/day activity, 67% VP, no VT  ECHO: 11/12/22: LVEF 45-50%, RV function low normal.  11/18/23: LVEF 40-45%, RV mod reduced, BAE  CATH: LHC/RHC: 08/2016:  There is moderate aortic valve stenosis. SVG graft was visualized by angiography and is normal in caliber. 1st Mrg lesion, 65 %stenosed. Ost LM to LM lesion, 100 %stenosed. SVG graft was visualized by angiography and is normal in caliber. LIMA graft was visualized by angiography and is normal in caliber. Prox RCA to Mid RCA lesion, 100 %stenosed. SVG graft was visualized by angiography and is normal in caliber and anatomically normal. Hemodynamic findings consistent with mild pulmonary hypertension.   1. Severe triple vessel CAD  with occluded left main and occluded mid RCA s/p 4V CABG with 4/4 patent bypass grafts.  2. The Left main is occluded at the ostium.  3. The LAD fills from the patent IMA graft and the Diagonal fills from the patent vein graft 4. The Circumflex fills from the patent vein graft to the OM. There is a moderate stenosis in the proximal segment of the OM branch proximal to the insertion of the vein graft.  5. The distal RCA fills from the patent vein graft.  6. Moderate AS 7. 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  NYHA class / AHA Stage: III, functional class confounded by debility. However, he is VP 67%  Volume status & Diuretics: Volume up on exam and OptiVol, continue torsemide  40 mg daily; + metolazone  2.5 mg/extra 40 KCL on Saturdays. Will  have him take addition metolazone  2.5/extra 40 KCL x 1 today.  Vasodilators: Continue Entresto  12/13 mg bid. Beta-Blocker: Continue Toprol  XL 50 mg daily. MRA: Continue Spironolactone  25 mg daily. Cardiometabolic: Off SGLT2i with UTIs. Devices therapies & Valvulopathies: s/p CRT-D, interrogation as above. Advanced therapies: not a candidate - Labs today. - I will ask device RN to send transmission in 1 week to follow thoracic impedence.  2. Permanent Atrial fibrillation & frequent PVCs  - Follow with EP, enrolled in iCM. - Continue amio 200 mg daily. - Continue Pradaxa  150 mg bid. - 67% VP on device interrogation today, ? If PVC burden up. Due for EP follow up, will send office a message.  3. CAD s/p CABG  - CABG in 2007; LHC in 2018. - No chest pain. - Continue ASA. - Continue atorvastatin  20 mg daily.  4. Aortic stenosis s/p TAVR - CRT-D placed due to syncope and TAVR. - Interrogation as above.  5. Hypothyroidism - on synthroid   - Per PCP  6. Dyspnea - Likely multifactorial. - PFTs have been ordered - Diurese as above.  Keep follow up next month with Dr. Bruce Caper, as scheduled.   Arlice Bene [place], FNP 01/16/24

## 2024-01-15 ENCOUNTER — Telehealth (HOSPITAL_COMMUNITY): Payer: Self-pay

## 2024-01-15 NOTE — Telephone Encounter (Signed)
 Called to confirm/remind patient of their appointment at the [facility] on 01/16/24.   Appointment:   [] Confirmed  [x] Left mess   [] No answer/No voice mail  [] VM Full/unable to leave message  [] Phone not in service  And to bring in all medications and/or complete list.

## 2024-01-16 ENCOUNTER — Encounter (HOSPITAL_COMMUNITY): Payer: Self-pay

## 2024-01-16 ENCOUNTER — Ambulatory Visit (HOSPITAL_COMMUNITY)
Admission: RE | Admit: 2024-01-16 | Discharge: 2024-01-16 | Disposition: A | Discharge status: Home / Self Care | Source: Ambulatory Visit | Attending: Family Medicine | Admitting: Family Medicine

## 2024-01-16 ENCOUNTER — Ambulatory Visit (HOSPITAL_COMMUNITY): Payer: Self-pay | Admitting: Family Medicine

## 2024-01-16 VITALS — BP 112/72 | HR 105 | Wt 250.0 lb

## 2024-01-16 DIAGNOSIS — Z7902 Long term (current) use of antithrombotics/antiplatelets: Secondary | ICD-10-CM | POA: Diagnosis not present

## 2024-01-16 DIAGNOSIS — Z951 Presence of aortocoronary bypass graft: Secondary | ICD-10-CM | POA: Diagnosis not present

## 2024-01-16 DIAGNOSIS — I493 Ventricular premature depolarization: Secondary | ICD-10-CM | POA: Diagnosis not present

## 2024-01-16 DIAGNOSIS — Z4502 Encounter for adjustment and management of automatic implantable cardiac defibrillator: Secondary | ICD-10-CM | POA: Diagnosis not present

## 2024-01-16 DIAGNOSIS — I130 Hypertensive heart and chronic kidney disease with heart failure and stage 1 through stage 4 chronic kidney disease, or unspecified chronic kidney disease: Secondary | ICD-10-CM | POA: Insufficient documentation

## 2024-01-16 DIAGNOSIS — Z87891 Personal history of nicotine dependence: Secondary | ICD-10-CM | POA: Diagnosis not present

## 2024-01-16 DIAGNOSIS — E039 Hypothyroidism, unspecified: Secondary | ICD-10-CM | POA: Diagnosis not present

## 2024-01-16 DIAGNOSIS — I2510 Atherosclerotic heart disease of native coronary artery without angina pectoris: Secondary | ICD-10-CM | POA: Insufficient documentation

## 2024-01-16 DIAGNOSIS — Z952 Presence of prosthetic heart valve: Secondary | ICD-10-CM | POA: Insufficient documentation

## 2024-01-16 DIAGNOSIS — N189 Chronic kidney disease, unspecified: Secondary | ICD-10-CM | POA: Diagnosis not present

## 2024-01-16 DIAGNOSIS — Z87440 Personal history of urinary (tract) infections: Secondary | ICD-10-CM | POA: Insufficient documentation

## 2024-01-16 DIAGNOSIS — R0600 Dyspnea, unspecified: Secondary | ICD-10-CM | POA: Diagnosis not present

## 2024-01-16 DIAGNOSIS — Z79890 Hormone replacement therapy (postmenopausal): Secondary | ICD-10-CM | POA: Insufficient documentation

## 2024-01-16 DIAGNOSIS — Z79899 Other long term (current) drug therapy: Secondary | ICD-10-CM | POA: Insufficient documentation

## 2024-01-16 DIAGNOSIS — I350 Nonrheumatic aortic (valve) stenosis: Secondary | ICD-10-CM | POA: Diagnosis not present

## 2024-01-16 DIAGNOSIS — Z7982 Long term (current) use of aspirin: Secondary | ICD-10-CM | POA: Diagnosis not present

## 2024-01-16 DIAGNOSIS — I5022 Chronic systolic (congestive) heart failure: Secondary | ICD-10-CM | POA: Insufficient documentation

## 2024-01-16 DIAGNOSIS — I4821 Permanent atrial fibrillation: Secondary | ICD-10-CM | POA: Insufficient documentation

## 2024-01-16 LAB — BRAIN NATRIURETIC PEPTIDE: B Natriuretic Peptide: 823.5 pg/mL — ABNORMAL HIGH (ref 0.0–100.0)

## 2024-01-16 NOTE — Patient Instructions (Addendum)
 Thank you for coming in today  If you had labs drawn today, any labs that are abnormal the clinic will call you No news is good news  Medications: Take extra dose of Metolazone  2.5 mg and extra dose of Potassium 40 meq today Take Metolazone  tomorrow 01/17/2024 as you do every Saturday    Follow up appointments:  Your physician recommends that you schedule a follow-up appointment in:  Keep follow up on 02/13/2024 With Dr. Bruce Caper    Do the following things EVERYDAY: Weigh yourself in the morning before breakfast. Write it down and keep it in a log. Take your medicines as prescribed Eat low salt foods--Limit salt (sodium) to 2000 mg per day.  Stay as active as you can everyday Limit all fluids for the day to less than 2 liters   At the [facility], you and your health needs are our priority. As part of our continuing mission to provide you with exceptional heart care, we have created designated Provider Care Teams. These Care Teams include your primary Cardiologist (physician) and Advanced Practice Providers (APPs- Physician Assistants and Nurse Practitioners) who all work together to provide you with the care you need, when you need it.   You may see any of the following providers on your designated Care Team at your next follow up: Dr Jules Oar Dr Peder Bourdon Dr. Mimi Alt, NP Ruddy Corral, [place] [facility] [place], [place] Dennise Fitz, NP Luster Salters, PharmD   Please be sure to bring in all your medications bottles to every appointment.    Thank you for choosing [facility]  If you have any questions or concerns before your next appointment please send us  a message through [place] or call our office at [phone].    TO LEAVE A MESSAGE FOR THE NURSE SELECT OPTION 2, PLEASE LEAVE A MESSAGE INCLUDING: YOUR NAME DATE OF BIRTH CALL BACK NUMBER REASON FOR CALL**this is  important as we prioritize the call backs  YOU WILL RECEIVE A CALL BACK THE SAME DAY AS LONG AS YOU CALL BEFORE 4:00 PM

## 2024-01-20 ENCOUNTER — Encounter (HOSPITAL_COMMUNITY): Payer: Self-pay | Admitting: Emergency Medicine

## 2024-01-20 ENCOUNTER — Ambulatory Visit: Attending: Cardiology

## 2024-01-20 ENCOUNTER — Emergency Department (HOSPITAL_COMMUNITY)
Admission: EM | Admit: 2024-01-20 | Discharge: 2024-01-20 | Disposition: A | Discharge status: Home / Self Care | Attending: Emergency Medicine | Admitting: Emergency Medicine

## 2024-01-20 ENCOUNTER — Emergency Department (HOSPITAL_COMMUNITY)

## 2024-01-20 ENCOUNTER — Other Ambulatory Visit: Payer: Self-pay

## 2024-01-20 DIAGNOSIS — R109 Unspecified abdominal pain: Secondary | ICD-10-CM | POA: Diagnosis not present

## 2024-01-20 DIAGNOSIS — W010XXA Fall on same level from slipping, tripping and stumbling without subsequent striking against object, initial encounter: Secondary | ICD-10-CM | POA: Diagnosis not present

## 2024-01-20 DIAGNOSIS — Z91010 Allergy to peanuts: Secondary | ICD-10-CM | POA: Diagnosis not present

## 2024-01-20 DIAGNOSIS — Z7982 Long term (current) use of aspirin: Secondary | ICD-10-CM | POA: Diagnosis not present

## 2024-01-20 DIAGNOSIS — Z95810 Presence of automatic (implantable) cardiac defibrillator: Secondary | ICD-10-CM

## 2024-01-20 DIAGNOSIS — I517 Cardiomegaly: Secondary | ICD-10-CM | POA: Diagnosis not present

## 2024-01-20 DIAGNOSIS — R0781 Pleurodynia: Secondary | ICD-10-CM | POA: Diagnosis not present

## 2024-01-20 DIAGNOSIS — I5042 Chronic combined systolic (congestive) and diastolic (congestive) heart failure: Secondary | ICD-10-CM | POA: Diagnosis not present

## 2024-01-20 DIAGNOSIS — G4733 Obstructive sleep apnea (adult) (pediatric): Secondary | ICD-10-CM | POA: Diagnosis not present

## 2024-01-20 DIAGNOSIS — K820 Obstruction of gallbladder: Secondary | ICD-10-CM | POA: Diagnosis not present

## 2024-01-20 DIAGNOSIS — W19XXXA Unspecified fall, initial encounter: Secondary | ICD-10-CM

## 2024-01-20 DIAGNOSIS — K760 Fatty (change of) liver, not elsewhere classified: Secondary | ICD-10-CM | POA: Diagnosis not present

## 2024-01-20 DIAGNOSIS — R1011 Right upper quadrant pain: Secondary | ICD-10-CM | POA: Insufficient documentation

## 2024-01-20 LAB — COMPREHENSIVE METABOLIC PANEL WITH GFR
ALT: 31 U/L (ref 0–44)
AST: 66 U/L — ABNORMAL HIGH (ref 15–41)
Albumin: 2.6 g/dL — ABNORMAL LOW (ref 3.5–5.0)
Alkaline Phosphatase: 130 U/L — ABNORMAL HIGH (ref 38–126)
Anion gap: 12 (ref 5–15)
BUN: 32 mg/dL — ABNORMAL HIGH (ref 8–23)
CO2: 26 mmol/L (ref 22–32)
Calcium: 8.3 mg/dL — ABNORMAL LOW (ref 8.9–10.3)
Chloride: 91 mmol/L — ABNORMAL LOW (ref 98–111)
Creatinine, Ser: 1.54 mg/dL — ABNORMAL HIGH (ref 0.61–1.24)
GFR, Estimated: 43 mL/min — ABNORMAL LOW (ref >60)
Glucose, Bld: 131 mg/dL — ABNORMAL HIGH (ref 70–99)
Potassium: 3.4 mmol/L — ABNORMAL LOW (ref 3.5–5.1)
Sodium: 129 mmol/L — ABNORMAL LOW (ref 135–145)
Total Bilirubin: 0.7 mg/dL (ref 0.0–1.2)
Total Protein: 6.9 g/dL (ref 6.5–8.1)

## 2024-01-20 LAB — CBC WITH DIFFERENTIAL/PLATELET
Abs Immature Granulocytes: 0.18 10*3/uL — ABNORMAL HIGH (ref 0.00–0.07)
Basophils Absolute: 0.1 10*3/uL (ref 0.0–0.1)
Basophils Relative: 1 %
Eosinophils Absolute: 0.4 10*3/uL (ref 0.0–0.5)
Eosinophils Relative: 2 %
HCT: 44.2 % (ref 39.0–52.0)
Hemoglobin: 14 g/dL (ref 13.0–17.0)
Immature Granulocytes: 1 %
Lymphocytes Relative: 16 %
Lymphs Abs: 2.9 10*3/uL (ref 0.7–4.0)
MCH: 27.8 pg (ref 26.0–34.0)
MCHC: 31.7 g/dL (ref 30.0–36.0)
MCV: 87.9 fL (ref 80.0–100.0)
Monocytes Absolute: 1.2 10*3/uL — ABNORMAL HIGH (ref 0.1–1.0)
Monocytes Relative: 7 %
Neutro Abs: 12.9 10*3/uL — ABNORMAL HIGH (ref 1.7–7.7)
Neutrophils Relative %: 73 %
Platelets: 345 10*3/uL (ref 150–400)
RBC: 5.03 MIL/uL (ref 4.22–5.81)
RDW: 19.3 % — ABNORMAL HIGH (ref 11.5–15.5)
WBC: 17.7 10*3/uL — ABNORMAL HIGH (ref 4.0–10.5)
nRBC: 0 % (ref 0.0–0.2)

## 2024-01-20 MED ORDER — FENTANYL CITRATE PF 50 MCG/ML IJ SOSY
50.0000 ug | PREFILLED_SYRINGE | Freq: Once | INTRAMUSCULAR | Status: AC
  Administered 2024-01-20: 50 ug via INTRAVENOUS
  Filled 2024-01-20: qty 1

## 2024-01-20 MED ORDER — ACETAMINOPHEN 325 MG PO TABS: 650.0000 mg | ORAL_TABLET | Freq: Once | ORAL | Status: DC

## 2024-01-20 MED ORDER — IOHEXOL 300 MG/ML  SOLN
100.0000 mL | Freq: Once | INTRAMUSCULAR | Status: AC | PRN
  Administered 2024-01-20: 100 mL via INTRAVENOUS

## 2024-01-20 NOTE — ED Notes (Signed)
 650 mLs urine removed from bedside

## 2024-01-20 NOTE — Progress Notes (Signed)
 Remote ICD transmission.

## 2024-01-20 NOTE — Discharge Instructions (Signed)
 As we discussed, all of your labs and imaging are normal.  This is likely just muscular bruising.  You can take Tylenol  as needed for pain.  You can follow-up with your primary care doctor in 1 week for further evaluation.  You may return to the emergency department for any worsening symptoms.

## 2024-01-20 NOTE — ED Triage Notes (Signed)
 Pt. BIBEMS from SNF [facility] for a witnessed fall. Pt c/o right side, rib pain. No LOC. Pt. Did not hit his head. Pt is on thinners. Pt. Has pacemaker. Pt. Is currently on O2 @ 2 liters. Pt. Wears O2 PRN at home.

## 2024-01-20 NOTE — ED Notes (Signed)
 Called facility to let them know that patient was returning back at this time. Daughter transported patient. DNR form handed to daughter and placed in folder in which she took with her.

## 2024-01-20 NOTE — ED Provider Notes (Signed)
 [place] [facility] AT [facility] Provider Note   CSN: [id number] Arrival date & time: 01/20/24  1551     History Chief Complaint  Patient presents with   Fall    Flank pain    Justin [name] is a 89 y.o. male patient who presents to the emergency department today after mechanical trip and fall.  Patient states that he has macular degeneration was getting out of the car and could not really see where he was stepping down to and tripped and fell onto a patch of grass.  Since then has been having some right upper quadrant and right flank tenderness.  Patient does take Pradaxa .  He did not hit his head or lose consciousness.   Fall       Home Medications Prior to Admission medications   Medication Sig Start Date End Date Taking? Authorizing Provider  albuterol  (VENTOLIN  HFA) 108 (90 Base) MCG/ACT inhaler Inhale 2 puffs into the lungs every 4 (four) hours as needed for wheezing or shortness of breath. 05/06/23   Donley Furth, MD  allopurinol  (ZYLOPRIM ) 100 MG tablet Take 1 tablet (100 mg total) by mouth daily. 09/18/23   Donley Furth, MD  amiodarone  (PACERONE ) 200 MG tablet Take 1 tablet (200 mg total) by mouth daily. 01/14/24   Verona Goodwill, MD  aspirin  81 MG tablet Take 1 tablet (81 mg total) by mouth daily. 05/19/19   Magnant, Justice Olp, PA-C  atorvastatin  (LIPITOR) 20 MG tablet TAKE ONE TABLET ONCE DAILY 10/29/23   Odie Benne, MD  Baclofen 5 MG TABS Take 5 mg by mouth every 12 (twelve) hours as needed.    [provider]  benzonatate  (TESSALON ) 100 MG capsule Take 100 mg by mouth 2 (two) times daily.    [provider]  calcium  carbonate (OS-CAL) 1250 (500 Ca) MG chewable tablet Chew 1 tablet by mouth daily.    [provider]  cetirizine (ZYRTEC) 10 MG tablet Take 10 mg by mouth 2 (two) times daily. As needed    [provider]  colchicine  0.6 MG tablet Take 1 tablet (0.6 mg total) by mouth every 6 (six)  hours as needed (gout). 09/18/23 09/17/24  Donley Furth, MD  Continuous Glucose Receiver (DEXCOM G6 RECEIVER) DEVI 1 Device by Does not apply route 3 (three) times daily. E11.69 Patient not taking: Reported on 12/31/2023 10/23/23   Mast, Man X, NP  Continuous Glucose Sensor (DEXCOM G6 SENSOR) MISC 1 Device by Does not apply route 3 (three) times daily. E11.69 Patient not taking: Reported on 12/31/2023 10/23/23   Mast, Man X, NP  Continuous Glucose Transmitter (DEXCOM G6 TRANSMITTER) MISC 1 Device by Does not apply route 3 (three) times daily. E11.69 Patient not taking: Reported on 12/31/2023 10/23/23   Mast, Man X, NP  dabigatran  (PRADAXA ) 150 MG CAPS capsule TAKE (1) CAPSULE TWICE DAILY. 10/24/23   Odie Benne, MD  docusate sodium  (COLACE) 100 MG capsule Take 100 mg by mouth 2 (two) times daily.    [provider]  fluticasone  (FLONASE ) 50 MCG/ACT nasal spray Place 2 sprays into both nostrils daily. 12/26/23   [provider]  insulin  aspart (NOVOLOG ) 100 UNIT/ML injection Inject 4 Units into the skin 3 (three) times daily before meals. Inject as per sliding scale: if 141 - 180 = 4 units Inject per sliding scale; 181 - 220 = 6 units Inject per sliding scale; 221 - 260 = 8 units Inject per sliding  scale; 261 - 300 = 10 units Inject per sliding scale; 301 - 350 = 12 units Inject per sliding scale; 351 - 400= 14 units Inject per sliding scale; 401+ = 16 units Inject per sliding scale, subcutaneously three times a day    [provider]  isosorbide  mononitrate (IMDUR ) 30 MG 24 hr tablet Take 1 tablet (30 mg total) by mouth daily. 09/02/23 12/22/24  Odie Benne, MD  ketotifen  (ZADITOR ) 0.025 % ophthalmic solution Place 1 drop into both eyes daily as needed (for itching).    [provider]  levothyroxine  (SYNTHROID ) 25 MCG tablet Take 25 mcg by mouth daily.    [provider]  levothyroxine  (SYNTHROID ) 75 MCG tablet Take 75 mcg by mouth daily. Take 2  tablets once daily    [provider]  magic mouthwash SOLN Take 5 mLs by mouth as directed. Suspension contains equal amounts of Maalox Extra Strength, nystatin, and diphenhydramine . RX'ed by dentist    [provider]  methocarbamol  (ROBAXIN ) 500 MG tablet Take 1 tablet (500 mg total) by mouth 4 (four) times daily as needed for muscle spasms. 07/07/23   Diedra Fowler, MD  metoCLOPramide  (REGLAN ) 10 MG tablet Take 10 mg by mouth daily.    [provider]  metolazone  (ZAROXOLYN ) 2.5 MG tablet Take 1 tablet (2.5 mg total) by mouth once a week every Saturday 10/27/23   Verona Goodwill, MD  metoprolol  succinate (TOPROL -XL) 50 MG 24 hr tablet Take 1 tablet (50 mg total) by mouth daily. Take with or immediately following a meal. 11/25/23   Faith Homes, MD  montelukast  (SINGULAIR ) 10 MG tablet TAKE ONE TABLET BY MOUTH ONCE DAILY IN THE EVENING 08/20/22   Hunsucker, Archer Kobs, MD  Multiple Minerals-Vitamins (CAL MAG ZINC +D3 PO) Take 2 tablets by mouth in the morning. 500 mg / 7 mg / 25 mcg    [provider]  Multiple Vitamins-Minerals (MULTI-VITAMIN GUMMIES) CHEW Chew 2 tablets by mouth See admin instructions. Chew 2 gummies by mouth in the morning    [provider]  Multiple Vitamins-Minerals (PRESERVISION AREDS 2) CAPS Take 1 capsule by mouth in the morning and at bedtime.    [provider]  nitrofurantoin (MACRODANTIN) 100 MG capsule Take 100 mg by mouth 2 (two) times daily.    [provider]  nystatin (MYCOSTATIN/NYSTOP) powder Apply 1 Application topically 2 (two) times daily.    [provider]  pantoprazole  (PROTONIX ) 40 MG tablet Take 1 tablet (40 mg total) by mouth daily. 10/23/23   Mast, Man X, NP  polyethylene glycol powder (GLYCOLAX/MIRALAX) 17 GM/SCOOP powder Take by mouth every 12 (twelve) hours as needed for moderate constipation.    [provider]  potassium chloride  SA (KLOR-CON  M) 20 MEQ tablet Take 1  tablet (20 mEq total) by mouth 2 (two) times daily. 02/18/23   Tylene Galla, PA-C  sacubitril-valsartan (ENTRESTO ) 24-26 MG TAKE 1/2 TABLET TWICE DAILY 12/23/23   Lee, [place], NP  silver  sulfADIAZINE  (SILVADENE ) 1 % cream Apply 1 Application topically 2 (two) times daily. 05/14/23   Donley Furth, MD  spironolactone  (ALDACTONE ) 25 MG tablet Take 1 tablet (25 mg total) by mouth daily. 10/24/23   Odie Benne, MD  torsemide  (DEMADEX ) 20 MG tablet Take 2 tablets (40 mg total) by mouth daily. 12/23/23   Lee, [place], NP  triamcinolone  cream (KENALOG ) 0.1 % Apply 1 application  topically daily as needed for dry skin (affected areas). 04/15/17  [provider]  valACYclovir  (VALTREX ) 500 MG tablet Take 500 mg by mouth 2 (two) times daily as needed (cold sores).    [provider]  venlafaxine  XR (EFFEXOR  XR) 37.5 MG 24 hr capsule Take 1 capsule (37.5 mg total) by mouth daily with breakfast. 10/23/23   Mast, Man X, NP      Allergies    Peanut-containing drug products, Sulfonamide derivatives, Amlodipine , Eliquis  [apixaban ], Lisinopril , and Xarelto  [rivaroxaban ]    Review of Systems   Review of Systems  All other systems reviewed and are negative.   Physical Exam Updated Vital Signs BP 110/77 (BP Location: Right Arm)   Pulse (!) 104   Temp 98.8 F (37.1 C) (Oral)   Resp 18   Ht 5\' 6"  (1.676 m)   Wt 113.4 kg   SpO2 100%   BMI 40.35 kg/m  Physical Exam Vitals and nursing note reviewed.  Constitutional:      General: He is not in acute distress.    Appearance: Normal appearance.  HENT:     Head: Normocephalic and atraumatic.  Eyes:     General:        Right eye: No discharge.        Left eye: No discharge.  Cardiovascular:     Comments: Regular rate and rhythm.  S1/S2 are distinct without any evidence of murmur, rubs, or gallops.  Radial pulses are 2+ bilaterally.  Dorsalis pedis pulses are 2+ bilaterally.  No evidence of pedal edema. Pulmonary:      Comments: Clear to auscultation bilaterally.  Normal effort.  No respiratory distress.  No evidence of wheezes, rales, or rhonchi heard throughout. Abdominal:     General: Abdomen is flat. Bowel sounds are normal. There is no distension.     Tenderness: There is abdominal tenderness in the right upper quadrant. There is right CVA tenderness. There is no guarding or rebound.  Musculoskeletal:        General: Normal range of motion.     Cervical back: Neck supple.  Skin:    General: Skin is warm and dry.     Findings: No rash.  Neurological:     General: No focal deficit present.     Mental Status: He is alert.  Psychiatric:        Mood and Affect: Mood normal.        Behavior: Behavior normal.     ED Results / Procedures / Treatments   Labs (all labs ordered are listed, but only abnormal results are displayed) Labs Reviewed  CBC WITH DIFFERENTIAL/PLATELET - Abnormal; Notable for the following components:      Result Value   WBC 17.7 (*)    RDW 19.3 (*)    Neutro Abs 12.9 (*)    Monocytes Absolute 1.2 (*)    Abs Immature Granulocytes 0.18 (*)    All other components within normal limits  COMPREHENSIVE METABOLIC PANEL WITH GFR - Abnormal; Notable for the following components:   Sodium 129 (*)    Potassium 3.4 (*)    Chloride 91 (*)    Glucose, Bld 131 (*)    BUN 32 (*)    Creatinine, Ser 1.54 (*)    Calcium  8.3 (*)    Albumin  2.6 (*)    AST 66 (*)    Alkaline Phosphatase 130 (*)    GFR, Estimated 43 (*)    All other components within normal limits    EKG None  Radiology DG Ribs Unilateral W/Chest Right Result  Date: 01/20/2024 CLINICAL DATA:  R rib pain sp fall EXAM: RIGHT RIBS AND CHEST - 3+ VIEW COMPARISON:  Dec 31, 2023 FINDINGS: Fine bony detail is degraded by patient body habitus. Central pulmonary vascular congestion. No focal airspace consolidation, pleural effusion, or pneumothorax. Moderate cardiomegaly. Aortic valve replacement. Left chest pacemaker/AICD with  leads terminating in the right atrium, right ventricle, and coronary sinus. Sternotomy wires. No acute, displaced rib fracture or destructive lesion. IMPRESSION: 1. Fine bony detail is degraded by patient body habitus. No acute, displaced rib fracture visualized. 2. Moderate cardiomegaly with central pulmonary vascular congestion. Electronically Signed   By: Rance Burrows M.D.   On: 01/20/2024 20:10   CT ABDOMEN PELVIS W CONTRAST Result Date: 01/20/2024 CLINICAL DATA:  Abdominal pain, acute, nonlocalized witnessed fall. Pt c/o right side, rib pain. No LOC. Pt. Did not hit his head. Pt is on thinners. Pt. Has pacemaker. Pt. Is currently on O2 @ 2 liters. Pt. Wears O2 PRN at home. EXAM: CT ABDOMEN AND PELVIS WITH CONTRAST TECHNIQUE: Multidetector CT imaging of the abdomen and pelvis was performed using the standard protocol following bolus administration of intravenous contrast. RADIATION DOSE REDUCTION: This exam was performed according to the departmental dose-optimization program which includes automated exposure control, adjustment of the mA and/or kV according to patient size and/or use of iterative reconstruction technique. CONTRAST:  OMNIPAQUE  IOHEXOL  300 MG/ML  SOLN COMPARISON:  Chest x-ray 11/19/2023. FINDINGS: Lower chest: Bilateral trace, left greater than right, pleural effusions. Bilateral lower lobe atelectasis. Vague interlobular wall thickening and Peri bronchovascular ground-glass airspace opacities. Aortic valve replacement. Cardiac leads noted. Coronary artery calcification. Trace hiatal hernia. Cardiomegaly. Hepatobiliary: The liver is enlarged measuring up to 20.5 cm. The hepatic parenchyma is diffusely hypodense compared to the splenic parenchyma consistent with fatty infiltration. Contracted gallbladder. No focal liver abnormality. No gallstones, gallbladder wall thickening, or pericholecystic fluid. No biliary dilatation.No laceration or subcapsular hematoma. Pancreas: Normal  pancreatic contour. No main pancreatic duct dilatation. Spleen: Not enlarged. No focal lesion. No laceration, subcapsular hematoma, or vascular injury. Adrenals/Urinary Tract: No nodularity bilaterally. Bilateral kidneys enhance symmetrically. No hydronephrosis. No contusion, laceration, or subcapsular hematoma. No injury to the vascular structures or collecting systems. No hydroureter. The urinary bladder is unremarkable. Stomach/Bowel: No small or large bowel wall thickening or dilatation. Colonic diverticulosis. The appendix is unremarkable. Vasculature/Lymphatics: Severe atherosclerotic plaque. No abdominal aorta or iliac aneurysm. No active contrast extravasation or pseudoaneurysm. No abdominal, pelvic, inguinal lymphadenopathy. Reproductive: Normal. Other: No simple free fluid ascites. No pneumoperitoneum. No hemoperitoneum. No mesenteric hematoma identified. No organized fluid collection. Musculoskeletal: No significant soft tissue hematoma. No acute pelvic fracture. No acute spinal fracture. Similar-appearing mild compression fracture of the L2 inferior endplate. Grade 1 anterolisthesis of L4 on L5. Mild retrolisthesis of L2 on L3 and L3 on L4. Other ports and devices: None. IMPRESSION: 1.  No acute intra-abdominal, intrapelvic traumatic injury. 2. No acute fracture or traumatic malalignment of the lumbar spine. Other imaging findings of potential clinical significance: 1. Cardiomegaly with bilateral trace pleural effusions. Bilateral lower lobe atelectasis with question superimposed edema or developing infection/inflammation. Correlate with chest x-ray PA and lateral view. 2. Trace hiatal hernia. 3. Hepatomegaly and hepatic steatosis. 4. Colonic diverticulosis with no acute diverticulitis. 5.  Aortic Atherosclerosis (ICD10-I70.0)-severe. Electronically Signed   By: Morgane  Naveau M.D.   On: 01/20/2024 19:09    Procedures Procedures    Medications Ordered in ED Medications  iohexol  (OMNIPAQUE ) 300  MG/ML solution 100 mL (100 mLs Intravenous  Contrast Given 01/20/24 1811)  fentaNYL  (SUBLIMAZE ) injection 50 mcg (50 mcg Intravenous Given 01/20/24 1935)    ED Course/ Medical Decision Making/ A&P Clinical Course as of 01/20/24 2053  Tue Jan 20, 2024  2029 CBC with Differential(!) There is evidence of leukocytosis with this seems to be improved from the patient's baseline. [CF]  2031 Comprehensive metabolic panel(!) Mild hyponatremia and hypokalemia and hypochloremia.  Elevated creatinine.  All of these seem to be at the patient's baseline.  Sodium is slightly worse.  No evidence of altered mental status. [CF]  2032 CT ABDOMEN PELVIS W CONTRAST I personally ordered and interpreted the study.  I will see any evidence of acute abdomen.  I do agree with the radiologist interpretation. [CF]    Clinical Course User Index [CF] Darletta Ehrich, PA-C   {   Click here for ABCD2, HEART and other calculators  Medical Decision Making ANDRZEJ SCULLY is a 89 y.o. male patient who presents to the emergency department today for further evaluation of mechanical trip and fall.  Patient is having some tenderness over the right upper quadrant and right flank area.  Will likely get a CT scan of the abdomen given the patient's anticoagulation status.  All labs and imaging are reassuring.  Will have him treat conservatively with Tylenol .  Likely muscular contusion from the fall.  No signs of acute abdomen.  No signs of rib fractures or obvious pneumothorax.  Strict turn precautions were discussed.  I will have him follow-up with his primary care doctor for further evaluation.  He is safe for discharge at this time.  Amount and/or Complexity of Data Reviewed Labs: ordered. Decision-making details documented in ED Course. Radiology: ordered. Decision-making details documented in ED Course.  Risk Prescription drug management.    Final Clinical Impression(s) / ED Diagnoses Final diagnoses:  Fall, initial  encounter  Flank pain    Rx / DC Orders ED Discharge Orders     None         Olympia Bianchi 01/20/24 2053    Ninetta Basket, MD 01/23/24 [phone]

## 2024-01-20 NOTE — ED Notes (Signed)
 Provider at bedside speaking with patient at this time for update

## 2024-01-21 ENCOUNTER — Telehealth (HOSPITAL_COMMUNITY): Payer: Self-pay | Admitting: Cardiology

## 2024-01-21 ENCOUNTER — Encounter: Payer: Self-pay | Admitting: Adult Health

## 2024-01-21 ENCOUNTER — Non-Acute Institutional Stay: Payer: Self-pay | Admitting: Adult Health

## 2024-01-21 ENCOUNTER — Encounter (HOSPITAL_COMMUNITY)

## 2024-01-21 DIAGNOSIS — R2681 Unsteadiness on feet: Secondary | ICD-10-CM | POA: Diagnosis not present

## 2024-01-21 DIAGNOSIS — M6281 Muscle weakness (generalized): Secondary | ICD-10-CM | POA: Diagnosis not present

## 2024-01-21 DIAGNOSIS — R296 Repeated falls: Secondary | ICD-10-CM | POA: Diagnosis not present

## 2024-01-21 DIAGNOSIS — K219 Gastro-esophageal reflux disease without esophagitis: Secondary | ICD-10-CM

## 2024-01-21 DIAGNOSIS — I5022 Chronic systolic (congestive) heart failure: Secondary | ICD-10-CM

## 2024-01-21 DIAGNOSIS — E039 Hypothyroidism, unspecified: Secondary | ICD-10-CM | POA: Diagnosis not present

## 2024-01-21 DIAGNOSIS — E1169 Type 2 diabetes mellitus with other specified complication: Secondary | ICD-10-CM

## 2024-01-21 DIAGNOSIS — M549 Dorsalgia, unspecified: Secondary | ICD-10-CM

## 2024-01-21 DIAGNOSIS — R1312 Dysphagia, oropharyngeal phase: Secondary | ICD-10-CM | POA: Diagnosis not present

## 2024-01-21 DIAGNOSIS — R29898 Other symptoms and signs involving the musculoskeletal system: Secondary | ICD-10-CM | POA: Diagnosis not present

## 2024-01-21 DIAGNOSIS — R2689 Other abnormalities of gait and mobility: Secondary | ICD-10-CM | POA: Diagnosis not present

## 2024-01-21 DIAGNOSIS — F339 Major depressive disorder, recurrent, unspecified: Secondary | ICD-10-CM

## 2024-01-21 DIAGNOSIS — M1A0490 Idiopathic chronic gout, unspecified hand, without tophus (tophi): Secondary | ICD-10-CM

## 2024-01-21 DIAGNOSIS — Z794 Long term (current) use of insulin: Secondary | ICD-10-CM

## 2024-01-21 DIAGNOSIS — R748 Abnormal levels of other serum enzymes: Secondary | ICD-10-CM

## 2024-01-21 DIAGNOSIS — I4819 Other persistent atrial fibrillation: Secondary | ICD-10-CM | POA: Diagnosis not present

## 2024-01-21 NOTE — Telephone Encounter (Signed)
 Patients daughter left VM on triage line  Reports pt fell 5/27-seen in ER-CXR completed and fluid seen in lung(s)   Daughter questioned if additional diuretic changes are needed  Aware to take super pill x 2 however fluid still present on CXR after doses  Please advise

## 2024-01-21 NOTE — Progress Notes (Incomplete)
 Location:  [facility] Room Number: 913 A Place of Service:  ALF (13) Provider:  Medina-Vargas, Burnie Hank, DNP, FNP-BC  Patient Care Team: Mast, Man X, NP as PCP - General (Internal Medicine) Odie Benne, MD as PCP - Cardiology (Cardiology) Verona Goodwill, MD as PCP - Electrophysiology (Cardiology) Christina Coyer, MD as Consulting Physician (Urology) Alver Austin, [facility] (Inactive) as Pharmacist (Pharmacist) Dema Filler, MD as Consulting Physician (Ophthalmology)  Extended Emergency Contact Information Primary Emergency Contact: [facility] Address: [address]          [place], [place] [address] [place] Phone: [phone] Relation: Daughter Secondary Emergency Contact: Bluford Burkitt Address: [address]          [place], [place] [address] [place] Home Phone: [phone] Mobile Phone: [phone] Relation: Daughter  Code Status:   Full Code  Goals of care: Advanced Directive information    01/20/2024    4:07 PM  Advanced Directives  Does Patient Have a Medical Advance Directive? Yes  Type of Estate agent of [place];Living will  Does patient want to make changes to medical advance directive? No - Patient declined     Chief Complaint  Patient presents with  . Acute Visit    Follow up ED visit    HPI:  Pt is a 89 y.o. male seen today to follow up ED visit. He is a resident of [facility]. He had a mechanical trip and fall and was brought to the ED on 01/20/2024.  He had a fall while getting out of the car and was having some right upper quadrant and right flank tenderness.  He takes Pradaxa  and denies hitting his head or loss of consciousness.  CT abdomen done showed cardiomegaly with bilateral trace pleural effusions.  He has hepatomegaly and hepatic steatosis.   Past Medical History:  Diagnosis Date  . Age-related macular  degeneration, dry, both eyes   . Allergic    "24/7; 365 days/year; I'm allergic to pollens, dust, all southern grasses/trees, mold, mildue, cats, dogs" (01/07/2017)  . Anal fissure   . Asthma    sees Dr. McQuaid   . Benign prostatic hypertrophy    (sees Dr. Verdel Gitelman  . CAD (coronary artery disease)    a. s/p CABG 2007. b. Cath 08/2016 - 4/4 patent grafts.  . Carotid bruit    carotid u/s 10/10: 0.39% bilaterally  . Chronic combined systolic and diastolic CHF (congestive heart failure) (HCC)   . Complication of anesthesia 1980s   "w/anal cyst OR, he gave me a saddle block then put a narcotic in spinal cord; had a severe reaction to that" (01/07/2017)  . Congestive heart failure (CHF) (HCC)   . ED (erectile dysfunction)   . Family history of adverse reaction to anesthesia    "daughter wakes up during OR" (01/07/2017)  . GERD (gastroesophageal reflux disease)   . Gout   . HTN (hypertension)   . Hx of colonic polyps    (sees Dr. Elvin Hammer)  . Hyperlipidemia   . Hypothyroidism   . Moderate to severe aortic stenosis    a. s/p TAVR 02/2017.  Aaron Aas Myocardial infarction (HCC) ~ 2000  . Obesity   . Osteoarthritis    "was in my knees, hands" (01/07/2017 )  . PAF (paroxysmal atrial fibrillation) (HCC)    a. documented post TAVR.  Aaron Aas Precancerous skin lesion    (sees Dr. Fleurette Humbles)  . Prostate cancer (HCC) dx'd ~  2014  . 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 ANUS SURGERY     "opened it back up cause it wouldn't heal; wound up w/a fissure" (01/07/2017)  . BIV ICD INSERTION CRT-D N/A 05/02/2017   Procedure: BIV ICD INSERTION CRT-D;  Surgeon: Verona Goodwill, MD;  Location: [facility] INVASIVE CV LAB;  Service: Cardiovascular;  Laterality: N/A;  . CARDIAC CATHETERIZATION  10/29/2005  . CARDIAC CATHETERIZATION N/A  08/27/2016   Procedure: Right/Left Heart Cath and Coronary/Graft Angiography;  Surgeon: Odie Benne, MD;  Location: [facility] INVASIVE CV LAB;  Service: Cardiovascular;  Laterality: N/A;  . CARDIOVERSION N/A 04/24/2021   Procedure: CARDIOVERSION;  Surgeon: Elmyra Haggard, MD;  Location: [facility] ENDOSCOPY;  Service: Cardiovascular;  Laterality: N/A;  . CARDIOVERSION N/A 11/08/2021   Procedure: CARDIOVERSION;  Surgeon: Hazle Lites, MD;  Location: [facility] ENDOSCOPY;  Service: Cardiovascular;  Laterality: N/A;  . CARDIOVERSION N/A 02/06/2022   Procedure: CARDIOVERSION;  Surgeon: Loyde Rule, MD;  Location: [facility] ENDOSCOPY;  Service: Cardiovascular;  Laterality: N/A;  . CATARACT EXTRACTION W/ INTRAOCULAR LENS  IMPLANT, BILATERAL Bilateral   . CATARACT EXTRACTION, BILATERAL  2012  . COLONOSCOPY  06/30/2008   no repeats needed   . COLONOSCOPY     had 3 or 4 in the past   . CORONARY ARTERY BYPASS GRAFT  2007   "CABG X4"  . CYST EXCISION PERINEAL  1980s  . HAMMER TOE SURGERY Bilateral   . ICD GENERATOR CHANGEOUT N/A 05/30/2023   Procedure: ICD GENERATOR CHANGEOUT;  Surgeon: Verona Goodwill, MD;  Location: [facility] INVASIVE CV LAB;  Service: Cardiovascular;  Laterality: N/A;  . JOINT REPLACEMENT    . KNEE ARTHROPLASTY  07/30/2011   Procedure: COMPUTER ASSISTED TOTAL KNEE ARTHROPLASTY;  Surgeon: Jasmine Mesi;  Location: [facility] OR;  Service: Orthopedics;  Laterality: Left;  left total knee arthroplasty  . MASTECTOMY SUBCUTANEOUS Bilateral   . MULTIPLE TOOTH EXTRACTIONS    . ORIF FINGER / THUMB FRACTURE Right ~ 1980   "repair of thumb injury"  . PROSTATE BIOPSY    . REPLACEMENT TOTAL KNEE BILATERAL Bilateral 2012  . TEE WITHOUT CARDIOVERSION N/A 03/04/2017   Procedure: TRANSESOPHAGEAL ECHOCARDIOGRAM (TEE);  Surgeon: Odie Benne, MD;  Location: [facility] OR;  Service: Open Heart Surgery;  Laterality: N/A;  . TOTAL KNEE REVISION Right 05/18/2019   Procedure: RIGHT PATELLA REVISION/REMOVAL;  Surgeon: Jasmine Mesi, MD;  Location[facility] OR;  Service: Orthopedics;  Laterality: Right;  . TRANSCATHETER AORTIC VALVE REPLACEMENT, TRANSFEMORAL N/A 03/04/2017   Procedure: TRANSCATHETER AORTIC VALVE REPLACEMENT, TRANSFEMORAL;  Surgeon: Odie Benne, MD;  Location: [facility] OR;  Service: Open Heart Surgery;  Laterality: N/A;    Allergies  Allergen Reactions  . Peanut-Containing Drug Products Anaphylaxis  . Sulfonamide Derivatives Anaphylaxis  . Amlodipine  Swelling and Other (See Comments)    Swelling in ankles  . Eliquis  [Apixaban ] Other (See Comments)    Back/hip pain  . Lisinopril  Cough  . Xarelto  [Rivaroxaban ] Other (See Comments)    Back/hip pain    Outpatient Encounter Medications as of 01/21/2024  Medication Sig  . albuterol  (VENTOLIN  HFA) 108 (90 Base) MCG/ACT inhaler Inhale 2 puffs into the lungs every 4 (four) hours as needed for wheezing or shortness of breath.  . allopurinol  (ZYLOPRIM ) 100 MG tablet Take  1 tablet (100 mg total) by mouth daily.  . amiodarone  (PACERONE ) 200 MG tablet Take 1 tablet (200 mg total) by mouth daily.  . aspirin  81 MG tablet Take 1 tablet (81 mg total) by mouth daily.  . atorvastatin  (LIPITOR) 20 MG tablet TAKE ONE TABLET ONCE DAILY  . Baclofen 5 MG TABS Take 5 mg by mouth every 12 (twelve) hours as needed.  . benzonatate  (TESSALON ) 100 MG capsule Take 100 mg by mouth 2 (two) times daily.  . calcium  carbonate (OS-CAL) 1250 (500 Ca) MG chewable tablet Chew 1 tablet by mouth daily.  . cetirizine (ZYRTEC) 10 MG tablet Take 10 mg by mouth 2 (two) times daily. As needed  . colchicine  0.6 MG tablet Take 1 tablet (0.6 mg total) by mouth every 6 (six) hours as needed (gout).  . Continuous Glucose Receiver (DEXCOM G6 RECEIVER) DEVI 1 Device by Does not apply route 3 (three) times daily. E11.69 (Patient not taking: Reported on 12/31/2023)  . Continuous Glucose Sensor (DEXCOM G6 SENSOR) MISC 1 Device by Does not apply route 3 (three) times daily. E11.69 (Patient not  taking: Reported on 12/31/2023)  . Continuous Glucose Transmitter (DEXCOM G6 TRANSMITTER) MISC 1 Device by Does not apply route 3 (three) times daily. E11.69 (Patient not taking: Reported on 12/31/2023)  . dabigatran  (PRADAXA ) 150 MG CAPS capsule TAKE (1) CAPSULE TWICE DAILY.  . docusate sodium  (COLACE) 100 MG capsule Take 100 mg by mouth 2 (two) times daily.  . fluticasone  (FLONASE ) 50 MCG/ACT nasal spray Place 2 sprays into both nostrils daily.  . insulin  aspart (NOVOLOG ) 100 UNIT/ML injection Inject 4 Units into the skin 3 (three) times daily before meals. Inject as per sliding scale: if 141 - 180 = 4 units Inject per sliding scale; 181 - 220 = 6 units Inject per sliding scale; 221 - 260 = 8 units Inject per sliding scale; 261 - 300 = 10 units Inject per sliding scale; 301 - 350 = 12 units Inject per sliding scale; 351 - 400= 14 units Inject per sliding scale; 401+ = 16 units Inject per sliding scale, subcutaneously three times a day  . isosorbide  mononitrate (IMDUR ) 30 MG 24 hr tablet Take 1 tablet (30 mg total) by mouth daily.  . ketotifen  (ZADITOR ) 0.025 % ophthalmic solution Place 1 drop into both eyes daily as needed (for itching).  . levothyroxine  (SYNTHROID ) 25 MCG tablet Take 25 mcg by mouth daily.  . levothyroxine  (SYNTHROID ) 75 MCG tablet Take 75 mcg by mouth daily. Take 2 tablets once daily  . magic mouthwash SOLN Take 5 mLs by mouth as directed. Suspension contains equal amounts of Maalox Extra Strength, nystatin, and diphenhydramine . RX'ed by dentist  . methocarbamol  (ROBAXIN ) 500 MG tablet Take 1 tablet (500 mg total) by mouth 4 (four) times daily as needed for muscle spasms.  . metoCLOPramide  (REGLAN ) 10 MG tablet Take 10 mg by mouth daily.  . metolazone  (ZAROXOLYN ) 2.5 MG tablet Take 1 tablet (2.5 mg total) by mouth once a week every Saturday  . metoprolol  succinate (TOPROL -XL) 50 MG 24 hr tablet Take 1 tablet (50 mg total) by mouth daily. Take with or immediately following a meal.  .  montelukast  (SINGULAIR ) 10 MG tablet TAKE ONE TABLET BY MOUTH ONCE DAILY IN THE EVENING  . Multiple Minerals-Vitamins (CAL MAG ZINC +D3 PO) Take 2 tablets by mouth in the morning. 500 mg / 7 mg / 25 mcg  . Multiple Vitamins-Minerals (MULTI-VITAMIN GUMMIES) CHEW Chew 2 tablets by mouth See admin  instructions. Chew 2 gummies by mouth in the morning  . Multiple Vitamins-Minerals (PRESERVISION AREDS 2) CAPS Take 1 capsule by mouth in the morning and at bedtime.  . nitrofurantoin (MACRODANTIN) 100 MG capsule Take 100 mg by mouth 2 (two) times daily.  Aaron Aas nystatin (MYCOSTATIN/NYSTOP) powder Apply 1 Application topically 2 (two) times daily.  . pantoprazole  (PROTONIX ) 40 MG tablet Take 1 tablet (40 mg total) by mouth daily.  . polyethylene glycol powder (GLYCOLAX/MIRALAX) 17 GM/SCOOP powder Take by mouth every 12 (twelve) hours as needed for moderate constipation.  . potassium chloride  SA (KLOR-CON  M) 20 MEQ tablet Take 1 tablet (20 mEq total) by mouth 2 (two) times daily.  . sacubitril-valsartan (ENTRESTO ) 24-26 MG TAKE 1/2 TABLET TWICE DAILY  . silver  sulfADIAZINE  (SILVADENE ) 1 % cream Apply 1 Application topically 2 (two) times daily.  . spironolactone  (ALDACTONE ) 25 MG tablet Take 1 tablet (25 mg total) by mouth daily.  . torsemide  (DEMADEX ) 20 MG tablet Take 2 tablets (40 mg total) by mouth daily.  . triamcinolone  cream (KENALOG ) 0.1 % Apply 1 application  topically daily as needed for dry skin (affected areas).  . valACYclovir  (VALTREX ) 500 MG tablet Take 500 mg by mouth 2 (two) times daily as needed (cold sores).  . venlafaxine  XR (EFFEXOR  XR) 37.5 MG 24 hr capsule Take 1 capsule (37.5 mg total) by mouth daily with breakfast.   No facility-administered encounter medications on file as of 01/21/2024.    Review of Systems  Constitutional:  Negative for activity change, appetite change and fever.  HENT:  Negative for sore throat.   Eyes: Negative.   Cardiovascular:  Negative for chest pain and leg  swelling.  Gastrointestinal:  Negative for abdominal distention, diarrhea and vomiting.  Genitourinary:  Negative for dysuria, frequency and urgency.  Musculoskeletal:  Positive for back pain.       Right mid back pain, rated 6-7/10   Skin:  Negative for color change.  Neurological:  Negative for dizziness and headaches.  Psychiatric/Behavioral:  Negative for behavioral problems and sleep disturbance. The patient is not nervous/anxious.      Immunization History  Administered Date(s) Administered  . Fluad Quad(high Dose 65+) 05/06/2019, 05/30/2020, 06/18/2021, 05/27/2023  . Hep A / Hep B 04/06/2012, 10/07/2012  . Hepatitis B 05/07/2012  . Influenza Split 06/28/2013  . Influenza Whole 06/05/2007, 05/13/2008, 06/06/2009, 05/30/2010  . Influenza, High Dose Seasonal PF 05/15/2018, 05/31/2022  . Influenza,inj,Quad PF,6+ Mos 05/20/2016  . Influenza-Unspecified 05/25/2014, 07/08/2015, 05/06/2017, 07/29/2023  . Moderna Covid-19 Vaccine Bivalent Booster 12yrs & up 05/31/2022  . PFIZER(Purple Top)SARS-COV-2 Vaccination 09/28/2019, 10/12/2019, 05/30/2020  . PPD Test 03/31/2023  . Pfizer Covid-19 Vaccine Bivalent Booster 28yrs & up 11/18/2021, 07/29/2023  . Pneumococcal Conjugate-13 09/18/2015  . Pneumococcal Polysaccharide-23 05/13/2008, 05/25/2014  . Td 05/30/2010  . Tdap 10/27/2020  . Zoster Recombinant(Shingrix) 05/18/2018, 10/16/2018  . Zoster, Live 10/21/2007   Pertinent  Health Maintenance Due  Topic Date Due  . FOOT EXAM  Never done  . OPHTHALMOLOGY EXAM  01/07/2024  . INFLUENZA VACCINE  03/26/2024  . HEMOGLOBIN A1C  04/12/2024      10/10/2022   11:28 AM 03/31/2023   10:48 AM 10/09/2023    1:31 PM 11/13/2023    1:50 PM 12/31/2023    8:52 AM  Fall Risk  Falls in the past year? 1 0 0 0 0  Was there an injury with Fall? 1 0 0 0   Was there an injury with Fall? - Comments Back pain  Fall Risk Category Calculator 3 0 0 0   Patient at Risk for Falls Due to No Fall Risks No Fall  Risks History of fall(s) Impaired balance/gait;Impaired mobility   Fall risk Follow up Falls evaluation completed Falls evaluation completed Falls evaluation completed Falls evaluation completed      Vitals:   01/21/24 1429  BP: 114/70  Pulse: (!) 101  Resp: 20  Temp: 97.7 F (36.5 C)  SpO2: 95%  Weight: 244 lb (110.7 kg)  Height: 5' 6.5" (1.689 m)   Body mass index is 38.79 kg/m.  Physical Exam Constitutional:      Appearance: He is obese.  HENT:     Head: Normocephalic and atraumatic.     Mouth/Throat:     Mouth: Mucous membranes are moist.  Eyes:     Conjunctiva/sclera: Conjunctivae normal.  Cardiovascular:     Rate and Rhythm: Normal rate and regular rhythm.     Pulses: Normal pulses.     Heart sounds: Normal heart sounds.  Pulmonary:     Effort: Pulmonary effort is normal.     Breath sounds: Normal breath sounds.  Abdominal:     General: Bowel sounds are normal.     Palpations: Abdomen is soft.  Musculoskeletal:        General: No swelling. Normal range of motion.     Cervical back: Normal range of motion.  Skin:    General: Skin is warm and dry.  Neurological:     General: No focal deficit present.     Mental Status: He is alert and oriented to person, place, and time.  Psychiatric:        Mood and Affect: Mood normal.        Behavior: Behavior normal.        Thought Content: Thought content normal.        Judgment: Judgment normal.      Labs reviewed: Recent Labs    06/30/23 1616 07/01/23 0355 07/02/23 0415 11/25/23 0728 11/26/23 0417 11/27/23 0410 12/16/23 0000 12/23/23 1208 01/20/24 1702  NA  --  140   < > 134* 134* 134* 134* 132* 129*  K  --  4.1   < > 4.0 3.8 3.8 3.9 3.7 3.4*  CL  --  106   < > 98 97* 96* 92* 93* 91*  CO2  --  24   < > 27 27 29  32* 26 26  GLUCOSE  --  125*   < > 87 94 106*  --  92 131*  BUN  --  14   < > 41* 43* 46* 25* 20 32*  CREATININE  --  1.30*   < > 1.13 1.11 1.32* 1.3 1.58* 1.54*  CALCIUM   --  7.8*   < > 7.8*  7.8* 7.7* 7.8* 8.1* 8.3*  MG 2.7*  --    < > 2.7* 2.6* 2.5*  --   --   --   PHOS 2.3* 3.3  --   --   --   --   --   --   --    < > = values in this interval not displayed.   Recent Labs    11/18/23 1918 11/19/23 1844 11/20/23 1317 12/16/23 0000 01/20/24 1702  AST 76*  --  93* 33 66*  ALT 45*  --  70* 33 31  ALKPHOS 147*  --  151* 160* 130*  BILITOT 0.8  --  0.8  --  0.7  PROT 6.7 7.3  6.5  --  6.9  ALBUMIN  2.1*  --  2.1* 2.4* 2.6*   Recent Labs    11/26/23 0417 11/27/23 0410 12/16/23 0000 01/20/24 1702  WBC 19.1* 20.4* 9.7 17.7*  NEUTROABS 16.4* 16.9* 5,645.00 12.9*  HGB 13.4 13.3 12.8* 14.0  HCT 43.0 41.9 41 44.2  MCV 86.2 84.5  --  87.9  PLT 345 352 211 345   Lab Results  Component Value Date   TSH 0.169 (L) 11/18/2023   Lab Results  Component Value Date   HGBA1C 6.8 (H) 10/14/2023   Lab Results  Component Value Date   CHOL 108 10/14/2023   HDL 33 (L) 10/14/2023   LDLCALC 55 10/14/2023   TRIG 113 10/14/2023   CHOLHDL 3.3 10/14/2023    Significant Diagnostic Results in last 30 days:  DG Ribs Unilateral W/Chest Right Result Date: 01/20/2024 CLINICAL DATA:  R rib pain sp fall EXAM: RIGHT RIBS AND CHEST - 3+ VIEW COMPARISON:  Dec 31, 2023 FINDINGS: Fine bony detail is degraded by patient body habitus. Central pulmonary vascular congestion. No focal airspace consolidation, pleural effusion, or pneumothorax. Moderate cardiomegaly. Aortic valve replacement. Left chest pacemaker/AICD with leads terminating in the right atrium, right ventricle, and coronary sinus. Sternotomy wires. No acute, displaced rib fracture or destructive lesion. IMPRESSION: 1. Fine bony detail is degraded by patient body habitus. No acute, displaced rib fracture visualized. 2. Moderate cardiomegaly with central pulmonary vascular congestion. Electronically Signed   By: Rance Burrows M.D.   On: 01/20/2024 20:10   CT ABDOMEN PELVIS W CONTRAST Result Date: 01/20/2024 CLINICAL DATA:  Abdominal pain,  acute, nonlocalized witnessed fall. Pt c/o right side, rib pain. No LOC. Pt. Did not hit his head. Pt is on thinners. Pt. Has pacemaker. Pt. Is currently on O2 @ 2 liters. Pt. Wears O2 PRN at home. EXAM: CT ABDOMEN AND PELVIS WITH CONTRAST TECHNIQUE: Multidetector CT imaging of the abdomen and pelvis was performed using the standard protocol following bolus administration of intravenous contrast. RADIATION DOSE REDUCTION: This exam was performed according to the departmental dose-optimization program which includes automated exposure control, adjustment of the mA and/or kV according to patient size and/or use of iterative reconstruction technique. CONTRAST:  OMNIPAQUE  IOHEXOL  300 MG/ML  SOLN COMPARISON:  Chest x-ray 11/19/2023. FINDINGS: Lower chest: Bilateral trace, left greater than right, pleural effusions. Bilateral lower lobe atelectasis. Vague interlobular wall thickening and Peri bronchovascular ground-glass airspace opacities. Aortic valve replacement. Cardiac leads noted. Coronary artery calcification. Trace hiatal hernia. Cardiomegaly. Hepatobiliary: The liver is enlarged measuring up to 20.5 cm. The hepatic parenchyma is diffusely hypodense compared to the splenic parenchyma consistent with fatty infiltration. Contracted gallbladder. No focal liver abnormality. No gallstones, gallbladder wall thickening, or pericholecystic fluid. No biliary dilatation.No laceration or subcapsular hematoma. Pancreas: Normal pancreatic contour. No main pancreatic duct dilatation. Spleen: Not enlarged. No focal lesion. No laceration, subcapsular hematoma, or vascular injury. Adrenals/Urinary Tract: No nodularity bilaterally. Bilateral kidneys enhance symmetrically. No hydronephrosis. No contusion, laceration, or subcapsular hematoma. No injury to the vascular structures or collecting systems. No hydroureter. The urinary bladder is unremarkable. Stomach/Bowel: No small or large bowel wall thickening or dilatation.  Colonic diverticulosis. The appendix is unremarkable. Vasculature/Lymphatics: Severe atherosclerotic plaque. No abdominal aorta or iliac aneurysm. No active contrast extravasation or pseudoaneurysm. No abdominal, pelvic, inguinal lymphadenopathy. Reproductive: Normal. Other: No simple free fluid ascites. No pneumoperitoneum. No hemoperitoneum. No mesenteric hematoma identified. No organized fluid collection. Musculoskeletal: No significant soft tissue hematoma. No acute pelvic fracture. No acute  spinal fracture. Similar-appearing mild compression fracture of the L2 inferior endplate. Grade 1 anterolisthesis of L4 on L5. Mild retrolisthesis of L2 on L3 and L3 on L4. Other ports and devices: None. IMPRESSION: 1.  No acute intra-abdominal, intrapelvic traumatic injury. 2. No acute fracture or traumatic malalignment of the lumbar spine. Other imaging findings of potential clinical significance: 1. Cardiomegaly with bilateral trace pleural effusions. Bilateral lower lobe atelectasis with question superimposed edema or developing infection/inflammation. Correlate with chest x-ray PA and lateral view. 2. Trace hiatal hernia. 3. Hepatomegaly and hepatic steatosis. 4. Colonic diverticulosis with no acute diverticulitis. 5.  Aortic Atherosclerosis (ICD10-I70.0)-severe. Electronically Signed   By: Morgane  Naveau M.D.   On: 01/20/2024 19:09   DG Chest 2 View Result Date: 01/01/2024 CLINICAL DATA:  Short of breath. EXAM: CHEST - 2 VIEW COMPARISON:  11/20/2023.  CT, 11/19/2023. FINDINGS: Stable changes from prior cardiac surgery. Cardiac silhouette is normal in size. Stable changes from an aortic valve replacement. Left anterior chest wall biventricular cardioverter-defibrillator is stable. No mediastinal or hilar masses. No evidence of adenopathy. Area lungs demonstrate prominent bronchovascular markings. There is additional opacity at the lung bases with suspected small effusions and atelectasis. No lung consolidation. No  pneumothorax. Skeletal structures are demineralized, grossly intact. IMPRESSION: 1. Possible mild degree of congestive heart failure with prominent vascular markings and small effusions. No overt edema. No evidence of pneumonia. Electronically Signed   By: Amanda Jungling M.D.   On: 01/01/2024 11:06    Assessment/Plan  1. Mid-back pain, acute (Primary) - X-ray was negative for fracture  2. Elevated liver enzymes -     3. Type 2 diabetes mellitus with other specified complication, with long-term current use of insulin  (HCC) ***  4. Hypothyroidism, unspecified type ***  5. Chronic gout of hand, unspecified cause, unspecified laterality ***  6. Persistent atrial fibrillation (HCC) ***  7. Gastroesophageal reflux disease, unspecified whether esophagitis present ***  8. Recurrent depression (HCC) ***  9. Chronic systolic CHF (congestive heart failure) (HCC) ***     Family/ staff Communication: Discussed plan of care with resident and charge nurse  Labs/tests ordered:     Fabyan Loughmiller Medina-Vargas, DNP, MSN, FNP-BC [facility] [phone] (Monday-Friday 8:00 a.m. - 5:00 p.m.) [phone] (after hours)

## 2024-01-21 NOTE — Progress Notes (Unsigned)
 Location:  [facility] Room Number: 913 [id number] Place of Service:  ALF (13) Provider:  Medina-Vargas, Rolin Schult, DNP, FNP-BC  Patient Care Team: Mast, Man X, NP as PCP - General (Internal Medicine) Odie Benne, MD as PCP - Cardiology (Cardiology) Verona Goodwill, MD as PCP - Electrophysiology (Cardiology) Christina Coyer, MD as Consulting Physician (Urology) Alver Austin, [facility] (Inactive) as Pharmacist (Pharmacist) Dema Filler, MD as Consulting Physician (Ophthalmology)  Extended Emergency Contact Information Primary Emergency Contact: [facility] Address: [address]          [place], [place] [address] [place] Phone: [phone] Relation: Daughter Secondary Emergency Contact: Bluford Burkitt Address: [address]          [place], [place] [address] [place] Home Phone: [phone] Mobile Phone: [phone] Relation: Daughter  Code Status:   Full Code  Goals of care: Advanced Directive information    01/20/2024    4:07 PM  Advanced Directives  Does Patient Have a Medical Advance Directive? Yes  Type of Estate agent of [place];Living will  Does patient want to make changes to medical advance directive? No - Patient declined     Chief Complaint  Patient presents with   Acute Visit    Follow up ED visit    HPI:  Pt is a 89 y.o. male seen today to follow up ED visit. He is a resident of [facility]. He had a mechanical trip and fall and was brought to the ED on 01/20/2024.  He had a fall while getting out of the car and was having some right upper quadrant and right flank tenderness.  He takes Pradaxa  and denies hitting his head or loss of consciousness.  CT abdomen done showed cardiomegaly with bilateral trace pleural effusions.  He has hepatomegaly and hepatic steatosis.   Past Medical History:  Diagnosis Date   Age-related macular  degeneration, dry, both eyes    Allergic    "24/7; 365 days/year; I'm allergic to pollens, dust, all southern grasses/trees, mold, mildue, cats, dogs" (01/07/2017)   Anal fissure    Asthma    sees Dr. Elverna Hamman    Benign prostatic hypertrophy    (sees Dr. Verdel Gitelman   CAD (coronary artery disease)    a. s/p CABG 2007. b. Cath 08/2016 - 4/4 patent grafts.   Carotid bruit    carotid u/s 10/10: 0.39% bilaterally   Chronic combined systolic and diastolic CHF (congestive heart failure) (HCC)    Complication of anesthesia 1980s   "w/anal cyst OR, he gave me a saddle block then put a narcotic in spinal cord; had a severe reaction to that" (01/07/2017)   Congestive heart failure (CHF) [facility])    ED (erectile dysfunction)    Family history of adverse reaction to anesthesia    "daughter wakes up during OR" (01/07/2017)   GERD (gastroesophageal reflux disease)    Gout    HTN (hypertension)    Hx of colonic polyps    (sees Dr. Elvin Hammer)   Hyperlipidemia    Hypothyroidism    Moderate to severe aortic stenosis    a. s/p TAVR 02/2017.   Myocardial infarction [facility]) ~ 2000   Obesity    Osteoarthritis    "was in my knees, hands" (01/07/2017 )   PAF (paroxysmal atrial fibrillation) (HCC)    a. documented post TAVR.   Precancerous skin lesion    (sees Dr. 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   29 mm Edwards Sapien 3 transcatheter heart valve placed via percutaneous right transfemoral approach   Past Surgical History:  Procedure Laterality Date   ANUS SURGERY     "opened it back up cause it wouldn't heal; wound up w/a fissure" (01/07/2017)   BIV ICD INSERTION CRT-D N/A 05/02/2017   Procedure: BIV ICD INSERTION CRT-D;  Surgeon: Verona Goodwill, MD;  Location: [facility] INVASIVE CV LAB;  Service: Cardiovascular;  Laterality: N/A;   CARDIAC CATHETERIZATION  10/29/2005   CARDIAC CATHETERIZATION N/A 08/27/2016   Procedure:  Right/Left Heart Cath and Coronary/Graft Angiography;  Surgeon: Odie Benne, MD;  Location: [facility] INVASIVE CV LAB;  Service: Cardiovascular;  Laterality: N/A;   CARDIOVERSION N/A 04/24/2021   Procedure: CARDIOVERSION;  Surgeon: Elmyra Haggard, MD;  Location: [facility] ENDOSCOPY;  Service: Cardiovascular;  Laterality: N/A;   CARDIOVERSION N/A 11/08/2021   Procedure: CARDIOVERSION;  Surgeon: Hazle Lites, MD;  Location: [facility] ENDOSCOPY;  Service: Cardiovascular;  Laterality: N/A;   CARDIOVERSION N/A 02/06/2022   Procedure: CARDIOVERSION;  Surgeon: Loyde Rule, MD;  Location: [facility] ENDOSCOPY;  Service: Cardiovascular;  Laterality: N/A;   CATARACT EXTRACTION W/ INTRAOCULAR LENS  IMPLANT, BILATERAL Bilateral    CATARACT EXTRACTION, BILATERAL  2012   COLONOSCOPY  06/30/2008   no repeats needed    COLONOSCOPY     had 3 or 4 in the past    CORONARY ARTERY BYPASS GRAFT  2007   "CABG X4"   CYST EXCISION PERINEAL  1980s   HAMMER TOE SURGERY Bilateral    ICD GENERATOR CHANGEOUT N/A 05/30/2023   Procedure: ICD GENERATOR CHANGEOUT;  Surgeon: Verona Goodwill, MD;  Location: [facility] INVASIVE CV LAB;  Service: Cardiovascular;  Laterality: N/A;   JOINT REPLACEMENT     KNEE ARTHROPLASTY  07/30/2011   Procedure: COMPUTER ASSISTED TOTAL KNEE ARTHROPLASTY;  Surgeon: Jasmine Mesi;  Location: [facility] OR;  Service: Orthopedics;  Laterality: Left;  left total knee arthroplasty   MASTECTOMY SUBCUTANEOUS Bilateral    MULTIPLE TOOTH EXTRACTIONS     ORIF FINGER / THUMB FRACTURE Right ~ 1980   "repair of thumb injury"   PROSTATE BIOPSY     REPLACEMENT TOTAL KNEE BILATERAL Bilateral 2012   TEE WITHOUT CARDIOVERSION N/A 03/04/2017   Procedure: TRANSESOPHAGEAL ECHOCARDIOGRAM (TEE);  Surgeon: Odie Benne, MD;  Location: [facility] OR;  Service: Open Heart Surgery;  Laterality: N/A;   TOTAL KNEE REVISION Right 05/18/2019   Procedure: RIGHT PATELLA REVISION/REMOVAL;  Surgeon: Jasmine Mesi, MD;  Location: [facility] OR;  Service:  Orthopedics;  Laterality: Right;   TRANSCATHETER AORTIC VALVE REPLACEMENT, TRANSFEMORAL N/A 03/04/2017   Procedure: TRANSCATHETER AORTIC VALVE REPLACEMENT, TRANSFEMORAL;  Surgeon: Odie Benne, MD;  Location: [facility] OR;  Service: Open Heart Surgery;  Laterality: N/A;    Allergies  Allergen Reactions   Peanut-Containing Drug Products Anaphylaxis   Sulfonamide Derivatives Anaphylaxis   Amlodipine  Swelling and Other (See Comments)    Swelling in ankles   Eliquis  [Apixaban ] Other (See Comments)    Back/hip pain   Lisinopril  Cough   Xarelto  [Rivaroxaban ] Other (See Comments)    Back/hip pain    Outpatient Encounter Medications as of 01/21/2024  Medication Sig   albuterol  (VENTOLIN  HFA) 108 (90 Base) MCG/ACT inhaler Inhale 2 puffs into the lungs every 4 (four) hours as needed for wheezing or shortness of breath.   allopurinol  (ZYLOPRIM ) 100 MG tablet Take  1 tablet (100 mg total) by mouth daily.   amiodarone  (PACERONE ) 200 MG tablet Take 1 tablet (200 mg total) by mouth daily.   aspirin  81 MG tablet Take 1 tablet (81 mg total) by mouth daily.   atorvastatin  (LIPITOR) 20 MG tablet TAKE ONE TABLET ONCE DAILY   Baclofen 5 MG TABS Take 5 mg by mouth every 12 (twelve) hours as needed.   benzonatate  (TESSALON ) 100 MG capsule Take 100 mg by mouth 2 (two) times daily.   calcium  carbonate (OS-CAL) 1250 (500 Ca) MG chewable tablet Chew 1 tablet by mouth daily.   cetirizine (ZYRTEC) 10 MG tablet Take 10 mg by mouth 2 (two) times daily. As needed   colchicine  0.6 MG tablet Take 1 tablet (0.6 mg total) by mouth every 6 (six) hours as needed (gout).   Continuous Glucose Receiver (DEXCOM G6 RECEIVER) DEVI 1 Device by Does not apply route 3 (three) times daily. E11.69 (Patient not taking: Reported on 12/31/2023)   Continuous Glucose Sensor (DEXCOM G6 SENSOR) MISC 1 Device by Does not apply route 3 (three) times daily. E11.69 (Patient not taking: Reported on 12/31/2023)   Continuous Glucose Transmitter  (DEXCOM G6 TRANSMITTER) MISC 1 Device by Does not apply route 3 (three) times daily. E11.69 (Patient not taking: Reported on 12/31/2023)   dabigatran  (PRADAXA ) 150 MG CAPS capsule TAKE (1) CAPSULE TWICE DAILY.   docusate sodium  (COLACE) 100 MG capsule Take 100 mg by mouth 2 (two) times daily.   fluticasone  (FLONASE ) 50 MCG/ACT nasal spray Place 2 sprays into both nostrils daily.   insulin  aspart (NOVOLOG ) 100 UNIT/ML injection Inject 4 Units into the skin 3 (three) times daily before meals. Inject as per sliding scale: if 141 - 180 = 4 units Inject per sliding scale; 181 - 220 = 6 units Inject per sliding scale; 221 - 260 = 8 units Inject per sliding scale; 261 - 300 = 10 units Inject per sliding scale; 301 - 350 = 12 units Inject per sliding scale; 351 - 400= 14 units Inject per sliding scale; 401+ = 16 units Inject per sliding scale, subcutaneously three times a day   isosorbide  mononitrate (IMDUR ) 30 MG 24 hr tablet Take 1 tablet (30 mg total) by mouth daily.   ketotifen  (ZADITOR ) 0.025 % ophthalmic solution Place 1 drop into both eyes daily as needed (for itching).   levothyroxine  (SYNTHROID ) 25 MCG tablet Take 25 mcg by mouth daily.   levothyroxine  (SYNTHROID ) 75 MCG tablet Take 75 mcg by mouth daily. Take 2 tablets once daily   magic mouthwash SOLN Take 5 mLs by mouth as directed. Suspension contains equal amounts of Maalox Extra Strength, nystatin, and diphenhydramine . RX'ed by dentist   methocarbamol  (ROBAXIN ) 500 MG tablet Take 1 tablet (500 mg total) by mouth 4 (four) times daily as needed for muscle spasms.   metoCLOPramide  (REGLAN ) 10 MG tablet Take 10 mg by mouth daily.   metolazone  (ZAROXOLYN ) 2.5 MG tablet Take 1 tablet (2.5 mg total) by mouth once a week every Saturday   metoprolol  succinate (TOPROL -XL) 50 MG 24 hr tablet Take 1 tablet (50 mg total) by mouth daily. Take with or immediately following a meal.   montelukast  (SINGULAIR ) 10 MG tablet TAKE ONE TABLET BY MOUTH ONCE DAILY IN THE  EVENING   Multiple Minerals-Vitamins (CAL MAG ZINC +D3 PO) Take 2 tablets by mouth in the morning. 500 mg / 7 mg / 25 mcg   Multiple Vitamins-Minerals (MULTI-VITAMIN GUMMIES) CHEW Chew 2 tablets by mouth See admin  instructions. Chew 2 gummies by mouth in the morning   Multiple Vitamins-Minerals (PRESERVISION AREDS 2) CAPS Take 1 capsule by mouth in the morning and at bedtime.   nitrofurantoin (MACRODANTIN) 100 MG capsule Take 100 mg by mouth 2 (two) times daily.   nystatin (MYCOSTATIN/NYSTOP) powder Apply 1 Application topically 2 (two) times daily.   pantoprazole  (PROTONIX ) 40 MG tablet Take 1 tablet (40 mg total) by mouth daily.   polyethylene glycol powder (GLYCOLAX/MIRALAX) 17 GM/SCOOP powder Take by mouth every 12 (twelve) hours as needed for moderate constipation.   potassium chloride  SA (KLOR-CON  M) 20 MEQ tablet Take 1 tablet (20 mEq total) by mouth 2 (two) times daily.   sacubitril-valsartan (ENTRESTO ) 24-26 MG TAKE 1/2 TABLET TWICE DAILY   silver  sulfADIAZINE  (SILVADENE ) 1 % cream Apply 1 Application topically 2 (two) times daily.   spironolactone  (ALDACTONE ) 25 MG tablet Take 1 tablet (25 mg total) by mouth daily.   torsemide  (DEMADEX ) 20 MG tablet Take 2 tablets (40 mg total) by mouth daily.   triamcinolone  cream (KENALOG ) 0.1 % Apply 1 application  topically daily as needed for dry skin (affected areas).   valACYclovir  (VALTREX ) 500 MG tablet Take 500 mg by mouth 2 (two) times daily as needed (cold sores).   venlafaxine  XR (EFFEXOR  XR) 37.5 MG 24 hr capsule Take 1 capsule (37.5 mg total) by mouth daily with breakfast.   No facility-administered encounter medications on file as of 01/21/2024.    Review of Systems  Constitutional:  Negative for activity change, appetite change and fever.  HENT:  Negative for sore throat.   Eyes: Negative.   Cardiovascular:  Negative for chest pain and leg swelling.  Gastrointestinal:  Negative for abdominal distention, diarrhea and vomiting.   Genitourinary:  Negative for dysuria, frequency and urgency.  Musculoskeletal:  Positive for back pain.       Right mid back pain, rated 6-7/10   Skin:  Negative for color change.  Neurological:  Negative for dizziness and headaches.  Psychiatric/Behavioral:  Negative for behavioral problems and sleep disturbance. The patient is not nervous/anxious.      Immunization History  Administered Date(s) Administered   Fluad Quad(high Dose 65+) 05/06/2019, 05/30/2020, 06/18/2021, 05/27/2023   Hep A / Hep B 04/06/2012, 10/07/2012   Hepatitis B 05/07/2012   Influenza Split 06/28/2013   Influenza Whole 06/05/2007, 05/13/2008, 06/06/2009, 05/30/2010   Influenza, High Dose Seasonal PF 05/15/2018, 05/31/2022   Influenza,inj,Quad PF,6+ Mos 05/20/2016   Influenza-Unspecified 05/25/2014, 07/08/2015, 05/06/2017, 07/29/2023   Moderna Covid-19 Vaccine Bivalent Booster 46yrs & up 05/31/2022   PFIZER(Purple Top)SARS-COV-2 Vaccination 09/28/2019, 10/12/2019, 05/30/2020   PPD Test 03/31/2023   Pfizer Covid-19 Vaccine Bivalent Booster 89yrs & up 11/18/2021, 07/29/2023   Pneumococcal Conjugate-13 09/18/2015   Pneumococcal Polysaccharide-23 05/13/2008, 05/25/2014   Td 05/30/2010   Tdap 10/27/2020   Zoster Recombinant(Shingrix) 05/18/2018, 10/16/2018   Zoster, Live 10/21/2007   Pertinent  Health Maintenance Due  Topic Date Due   FOOT EXAM  Never done   OPHTHALMOLOGY EXAM  01/07/2024   INFLUENZA VACCINE  03/26/2024   HEMOGLOBIN A1C  04/12/2024      10/10/2022   11:28 AM 03/31/2023   10:48 AM 10/09/2023    1:31 PM 11/13/2023    1:50 PM 12/31/2023    8:52 AM  Fall Risk  Falls in the past year? 1 0 0 0 0  Was there an injury with Fall? 1 0 0 0   Was there an injury with Fall? - Comments Back pain  Fall Risk Category Calculator 3 0 0 0   Patient at Risk for Falls Due to No Fall Risks No Fall Risks History of fall(s) Impaired balance/gait;Impaired mobility   Fall risk Follow up Falls evaluation  completed Falls evaluation completed Falls evaluation completed Falls evaluation completed      Vitals:   01/21/24 1429  BP: 114/70  Pulse: (!) 101  Resp: 20  Temp: 97.7 F (36.5 C)  SpO2: 95%  Weight: 244 lb (110.7 kg)  Height: 5' 6.5" (1.689 m)   Body mass index is 38.79 kg/m.  Physical Exam Constitutional:      Appearance: He is obese.  HENT:     Head: Normocephalic and atraumatic.     Mouth/Throat:     Mouth: Mucous membranes are moist.  Eyes:     Conjunctiva/sclera: Conjunctivae normal.  Cardiovascular:     Rate and Rhythm: Normal rate and regular rhythm.     Pulses: Normal pulses.     Heart sounds: Normal heart sounds.  Pulmonary:     Effort: Pulmonary effort is normal.     Breath sounds: Normal breath sounds.  Abdominal:     General: Bowel sounds are normal.     Palpations: Abdomen is soft.  Musculoskeletal:        General: No swelling. Normal range of motion.     Cervical back: Normal range of motion.  Skin:    General: Skin is warm and dry.  Neurological:     General: No focal deficit present.     Mental Status: He is alert and oriented to person, place, and time.  Psychiatric:        Mood and Affect: Mood normal.        Behavior: Behavior normal.        Thought Content: Thought content normal.        Judgment: Judgment normal.      Labs reviewed: Recent Labs    06/30/23 1616 07/01/23 0355 07/02/23 0415 11/25/23 0728 11/26/23 0417 11/27/23 0410 12/16/23 0000 12/23/23 1208 01/20/24 1702  NA  --  140   < > 134* 134* 134* 134* 132* 129*  K  --  4.1   < > 4.0 3.8 3.8 3.9 3.7 3.4*  CL  --  106   < > 98 97* 96* 92* 93* 91*  CO2  --  24   < > 27 27 29  32* 26 26  GLUCOSE  --  125*   < > 87 94 106*  --  92 131*  BUN  --  14   < > 41* 43* 46* 25* 20 32*  CREATININE  --  1.30*   < > 1.13 1.11 1.32* 1.3 1.58* 1.54*  CALCIUM   --  7.8*   < > 7.8* 7.8* 7.7* 7.8* 8.1* 8.3*  MG 2.7*  --    < > 2.7* 2.6* 2.5*  --   --   --   PHOS 2.3* 3.3  --   --   --    --   --   --   --    < > = values in this interval not displayed.   Recent Labs    11/18/23 1918 11/19/23 1844 11/20/23 1317 12/16/23 0000 01/20/24 1702  AST 76*  --  93* 33 66*  ALT 45*  --  70* 33 31  ALKPHOS 147*  --  151* 160* 130*  BILITOT 0.8  --  0.8  --  0.7  PROT 6.7 7.3  6.5  --  6.9  ALBUMIN  2.1*  --  2.1* 2.4* 2.6*   Recent Labs    11/26/23 0417 11/27/23 0410 12/16/23 0000 01/20/24 1702  WBC 19.1* 20.4* 9.7 17.7*  NEUTROABS 16.4* 16.9* 5,645.00 12.9*  HGB 13.4 13.3 12.8* 14.0  HCT 43.0 41.9 41 44.2  MCV 86.2 84.5  --  87.9  PLT 345 352 211 345   Lab Results  Component Value Date   TSH 0.169 (L) 11/18/2023   Lab Results  Component Value Date   HGBA1C 6.8 (H) 10/14/2023   Lab Results  Component Value Date   CHOL 108 10/14/2023   HDL 33 (L) 10/14/2023   LDLCALC 55 10/14/2023   TRIG 113 10/14/2023   CHOLHDL 3.3 10/14/2023    Significant Diagnostic Results in last 30 days:  DG Ribs Unilateral W/Chest Right Result Date: 01/20/2024 CLINICAL DATA:  R rib pain sp fall EXAM: RIGHT RIBS AND CHEST - 3+ VIEW COMPARISON:  Dec 31, 2023 FINDINGS: Fine bony detail is degraded by patient body habitus. Central pulmonary vascular congestion. No focal airspace consolidation, pleural effusion, or pneumothorax. Moderate cardiomegaly. Aortic valve replacement. Left chest pacemaker/AICD with leads terminating in the right atrium, right ventricle, and coronary sinus. Sternotomy wires. No acute, displaced rib fracture or destructive lesion. IMPRESSION: 1. Fine bony detail is degraded by patient body habitus. No acute, displaced rib fracture visualized. 2. Moderate cardiomegaly with central pulmonary vascular congestion. Electronically Signed   By: Rance Burrows M.D.   On: 01/20/2024 20:10   CT ABDOMEN PELVIS W CONTRAST Result Date: 01/20/2024 CLINICAL DATA:  Abdominal pain, acute, nonlocalized witnessed fall. Pt c/o right side, rib pain. No LOC. Pt. Did not hit his head. Pt is  on thinners. Pt. Has pacemaker. Pt. Is currently on O2 @ 2 liters. Pt. Wears O2 PRN at home. EXAM: CT ABDOMEN AND PELVIS WITH CONTRAST TECHNIQUE: Multidetector CT imaging of the abdomen and pelvis was performed using the standard protocol following bolus administration of intravenous contrast. RADIATION DOSE REDUCTION: This exam was performed according to the departmental dose-optimization program which includes automated exposure control, adjustment of the mA and/or kV according to patient size and/or use of iterative reconstruction technique. CONTRAST:  OMNIPAQUE IOHEXOL 300 MG/ML  SOLN COMPARISON:  Chest x-ray 11/19/2023. FINDINGS: Lower chest: Bilateral trace, left greater than right, pleural effusions. Bilateral lower lobe atelectasis. Vague interlobular wall thickening and Peri bronchovascular ground-glass airspace opacities. Aortic valve replacement. Cardiac leads noted. Coronary artery calcification. Trace hiatal hernia. Cardiomegaly. Hepatobiliary: The liver is enlarged measuring up to 20.5 cm. The hepatic parenchyma is diffusely hypodense compared to the splenic parenchyma consistent with fatty infiltration. Contracted gallbladder. No focal liver abnormality. No gallstones, gallbladder wall thickening, or pericholecystic fluid. No biliary dilatation.No laceration or subcapsular hematoma. Pancreas: Normal pancreatic contour. No main pancreatic duct dilatation. Spleen: Not enlarged. No focal lesion. No laceration, subcapsular hematoma, or vascular injury. Adrenals/Urinary Tract: No nodularity bilaterally. Bilateral kidneys enhance symmetrically. No hydronephrosis. No contusion, laceration, or subcapsular hematoma. No injury to the vascular structures or collecting systems. No hydroureter. The urinary bladder is unremarkable. Stomach/Bowel: No small or large bowel wall thickening or dilatation. Colonic diverticulosis. The appendix is unremarkable. Vasculature/Lymphatics: Severe atherosclerotic plaque.  No abdominal aorta or iliac aneurysm. No active contrast extravasation or pseudoaneurysm. No abdominal, pelvic, inguinal lymphadenopathy. Reproductive: Normal. Other: No simple free fluid ascites. No pneumoperitoneum. No hemoperitoneum. No mesenteric hematoma identified. No organized fluid collection. Musculoskeletal: No significant soft tissue hematoma. No acute pelvic fracture. No acute  spinal fracture. Similar-appearing mild compression fracture of the L2 inferior endplate. Grade 1 anterolisthesis of L4 on L5. Mild retrolisthesis of L2 on L3 and L3 on L4. Other ports and devices: None. IMPRESSION: 1.  No acute intra-abdominal, intrapelvic traumatic injury. 2. No acute fracture or traumatic malalignment of the lumbar spine. Other imaging findings of potential clinical significance: 1. Cardiomegaly with bilateral trace pleural effusions. Bilateral lower lobe atelectasis with question superimposed edema or developing infection/inflammation. Correlate with chest x-ray PA and lateral view. 2. Trace hiatal hernia. 3. Hepatomegaly and hepatic steatosis. 4. Colonic diverticulosis with no acute diverticulitis. 5.  Aortic Atherosclerosis (ICD10-I70.0)-severe. Electronically Signed   By: Morgane  Naveau M.D.   On: 01/20/2024 19:09   DG Chest 2 View Result Date: 01/01/2024 CLINICAL DATA:  Short of breath. EXAM: CHEST - 2 VIEW COMPARISON:  11/20/2023.  CT, 11/19/2023. FINDINGS: Stable changes from prior cardiac surgery. Cardiac silhouette is normal in size. Stable changes from an aortic valve replacement. Left anterior chest wall biventricular cardioverter-defibrillator is stable. No mediastinal or hilar masses. No evidence of adenopathy. Area lungs demonstrate prominent bronchovascular markings. There is additional opacity at the lung bases with suspected small effusions and atelectasis. No lung consolidation. No pneumothorax. Skeletal structures are demineralized, grossly intact. IMPRESSION: 1. Possible mild degree of  congestive heart failure with prominent vascular markings and small effusions. No overt edema. No evidence of pneumonia. Electronically Signed   By: Amanda Jungling M.D.   On: 01/01/2024 11:06    Assessment/Plan  1. Mid-back pain, acute (Primary) -  S/P fall - X-ray was negative for fracture -   Pain level on the right mid back is 6-7/10 -   Will apply Biofreeze 4% to right mid back topically 4 times a day   2. Elevated liver enzymes Lab Results  Component Value Date   ALT 31 01/20/2024   AST 66 (H) 01/20/2024   ALKPHOS 130 (H) 01/20/2024   BILITOT 0.7 01/20/2024    -   CT abdomen showed hepatic steatosis -   Will recheck on 01/27/2024 -   DC acetaminophen   3. Type 2 diabetes mellitus with other specified complication, with long-term current use of insulin  (HCC) Lab Results  Component Value Date   HGBA1C 6.8 (H) 10/14/2023    -   Continue NovoLog  sliding scale subcu with meals  4. Hypothyroidism, unspecified type Lab Results  Component Value Date   TSH 0.169 (L) 11/18/2023    - Continue levothyroxine  25 mcg +2 tabs of 75 mcg = 175 mcg daily  5. Chronic gout of hand, unspecified cause, unspecified laterality -   No recent gout flares -   Continue allopurinol  100 mg daily  6. Persistent atrial fibrillation (HCC) -   Continue amiodarone  200 mg daily and metoprolol  succinate ER 50 mg daily for rate control and Pradaxa  150 mg twice a day for anticoagulation  7. Gastroesophageal reflux disease, unspecified whether esophagitis present -    Stable - Continue Reglan  10 mg twice a day and pantoprazole  40 mg twice a day  8. Recurrent depression (HCC) -Mood is stable   - Continue venlafaxine  37.5 mg daily  9. Chronic systolic CHF (congestive heart failure) (HCC) -   Cardiology increase torsemide  to 60 mg daily and increase KCl to 60 mEq twice a day due to  x-ray showed pulmonary vascular congestion -Continue Entresto  24-26 mg half tab twice a day   Family/ staff  Communication: Discussed plan of care with resident, daughter and charge nurse  Labs/tests  ordered: BMP, CBC with differentials and liver enzymes on 01/27/2024    Collins Dimaria Medina-Vargas, DNP, MSN, FNP-BC [facility] [phone] (Monday-Friday 8:00 a.m. - 5:00 p.m.) [phone] (after hours)

## 2024-01-22 NOTE — Telephone Encounter (Signed)
 Daughter aware   [facility] aware Deana Eves) and orders faxed to [phone]

## 2024-01-23 ENCOUNTER — Ambulatory Visit (INDEPENDENT_AMBULATORY_CARE_PROVIDER_SITE_OTHER): Payer: Self-pay | Admitting: Sleep Medicine

## 2024-01-23 ENCOUNTER — Encounter (HOSPITAL_COMMUNITY)

## 2024-01-23 DIAGNOSIS — M6281 Muscle weakness (generalized): Secondary | ICD-10-CM | POA: Diagnosis not present

## 2024-01-23 DIAGNOSIS — R29898 Other symptoms and signs involving the musculoskeletal system: Secondary | ICD-10-CM | POA: Diagnosis not present

## 2024-01-23 DIAGNOSIS — R2681 Unsteadiness on feet: Secondary | ICD-10-CM | POA: Diagnosis not present

## 2024-01-23 DIAGNOSIS — R1312 Dysphagia, oropharyngeal phase: Secondary | ICD-10-CM | POA: Diagnosis not present

## 2024-01-23 DIAGNOSIS — R2689 Other abnormalities of gait and mobility: Secondary | ICD-10-CM | POA: Diagnosis not present

## 2024-01-23 DIAGNOSIS — R296 Repeated falls: Secondary | ICD-10-CM | POA: Diagnosis not present

## 2024-01-23 NOTE — Progress Notes (Signed)
 EPIC Encounter for ICM Monitoring  Patient Name: Justin [name] is a 89 y.o. male Date: 01/23/2024 Primary Care Physican: Mast, Man X, NP Primary Cardiologist: McAlhany/Sabharwal  Electrophysiologist:  Gareth Junes Pacing: 76.4%  09/27/2022 Weight: 270 lbs 02/12/2023 Weight: 261 lbs 07/07/2023 Office Weight: 261 lbs 10/07/2023 Weight: 245-250 lbs 10/24/2023 Office Weight: 252 lbs   Time in AT/AF  24.0 hr/day (100.0%)   Transmission results reviewed.  Per 5/23 OV note, instructed to take extra Metolazone  with 40 KCL that week.   Per 5/28 Phone note, Ruddy Corral, PA increased Torsemide  to 60 mg daily, KCL to 60 mEq bid and BMP & BNP in a week due to elevated BNP on 5/27.   Optivol thoracic impedance suggesting possible fluid accumulation starting 5/15 but trending closer to baseline on 5/25.   Prescribed: Torsemide  20 mg take 3 tablets (60 mg total) by mouth daily (increased 5/28).   Potassium 20 mEq take 3 tablets (60mEq total) by mouth twice a day Metolazone  2.5 mg Take 1 tablet by mouth once a week on Saturday (per 7/15 phone note should take extra 40 mEq of Potassium with Metolazone ).   Spironolactone  25 mg take 1 tablet daily   Labs: 01/20/2024 Creatinine 1.54, BUN 32, Potassium 3.4, Sodium 129, GFR 43  01/16/2024 BNP 823.5 12/23/2023 Creatinine 1.58, BUN 20, Potassium 3.7, Sodium 132, GFR 42, BNP 579.7 12/16/2023 Creatinine 1.3,   BUN 25, Potassium 3.9, Sodium 134  11/27/2023 Creatinine 1.32, BUN 46, Potassium 3.8, Sodium 134, GFR 52 A complete set of results can be found in Results Review.   Recommendations:   Recommendations given by Ruddy Corral, PA on 5/28 to increase Torsemide  & KCL.   Follow-up plan: ICM clinic phone appointment on 02/02/2024 to recheck fluid levels.   91 day device clinic remote transmission 03/02/2024.     EP/Cardiology Office Visits:    02/13/2024 with Dr Bruce Caper.   Recall 04/21/2024 with Dr Rodolfo Clan.      Copy of ICM check sent to Dr Daneil Dunker.   3  month ICM trend: 01/19/2024.    12-14 Month ICM trend:     Almyra Jain, RN 01/23/2024 12:54 PM

## 2024-01-23 NOTE — Telephone Encounter (Signed)
 Please notify patient that PSG revealed severe OSA, recommend proceeding with APAP therapy set to 4-20 cm H2O, EPR 3 with the Airtouch N30i nasal mask. Please also schedule a 3 month follow up visit if patient wishes to proceed with CPAP therapy. Thanks

## 2024-01-26 ENCOUNTER — Other Ambulatory Visit: Payer: Self-pay

## 2024-01-26 ENCOUNTER — Ambulatory Visit: Admitting: Orthopedic Surgery

## 2024-01-26 ENCOUNTER — Other Ambulatory Visit (INDEPENDENT_AMBULATORY_CARE_PROVIDER_SITE_OTHER)

## 2024-01-26 ENCOUNTER — Encounter: Payer: Self-pay | Admitting: Orthopedic Surgery

## 2024-01-26 VITALS — BP 112/73 | HR 103 | Ht 66.5 in | Wt 244.0 lb

## 2024-01-26 DIAGNOSIS — M545 Low back pain, unspecified: Secondary | ICD-10-CM

## 2024-01-26 DIAGNOSIS — R0781 Pleurodynia: Secondary | ICD-10-CM | POA: Diagnosis not present

## 2024-01-26 DIAGNOSIS — J300 Vasomotor rhinitis: Secondary | ICD-10-CM | POA: Diagnosis not present

## 2024-01-26 DIAGNOSIS — R0602 Shortness of breath: Secondary | ICD-10-CM

## 2024-01-26 DIAGNOSIS — J3089 Other allergic rhinitis: Secondary | ICD-10-CM | POA: Diagnosis not present

## 2024-01-26 DIAGNOSIS — G4733 Obstructive sleep apnea (adult) (pediatric): Secondary | ICD-10-CM

## 2024-01-26 DIAGNOSIS — J301 Allergic rhinitis due to pollen: Secondary | ICD-10-CM | POA: Diagnosis not present

## 2024-01-26 DIAGNOSIS — R052 Subacute cough: Secondary | ICD-10-CM | POA: Diagnosis not present

## 2024-01-26 MED ORDER — OXYCODONE HCL 5 MG PO TABS: 2.5000 mg | ORAL_TABLET | ORAL | 0 refills | Status: DC | PRN

## 2024-01-26 MED ORDER — DICLOFENAC SODIUM 1 % EX GEL: 4.0000 g | Freq: Three times a day (TID) | CUTANEOUS | 0 refills | Status: AC | PRN

## 2024-01-26 NOTE — Progress Notes (Signed)
 Orthopedic Surgery Progress Note   Assessment: Patient is a 89 y.o. male with right chest wall pain. Given the trauma and ecchymosis over the lateral thoracic cavity, my working diagnosis would be rib fracture or contusion of the ribs   Plan: -Ordered a CT of the chest to evaluate for rib fractures -Continue with symptomatic treatment for now -Can continue with tylenol . Use voltaren  gel over the painful area. Prescribed oxycodone  for additional pain relief -Weight bearing as tolerated -Follow up in 3 weeks, x-rays at next visit: none  ___________________________________________________________________________  History: Patient had a fall down a hill and landed on his right side. Noted immediate onset of right sided chest wall pain. He went to the ER where he got a CT abdomen pelvis and was told there was not any fracture. His daughter was concerned that his pain was higher up than the lumbar spine. His pain has not changed since this happened on 01/20/2024. No radiating leg pain. No back pain. Has been using tylenol  and lidocaine  patches. Feels pain is worse with deep breaths or coughing.    Physical Exam:  General: no acute distress, appears stated age Neurologic: alert, answering questions appropriately, following commands Respiratory: unlabored breathing on room air, symmetric chest rise Psychiatric: appropriate affect, normal cadence to speech  MSK:   -TTP over the right thoracic chest wall on the lateral and posterior aspect of the ribs, there is ecchymosis and redness around the lateral aspect of the ribs around T6 and T7.    Imaging: CT of the abdomen and pelvis from 01/20/2024 was independently reviewed and interpreted, showing vacuum disc phenomenon at L2/3, L3/4, and L5/S1. Bridging syndesmophytes in the lower thoracic spine. No acute fracture seen. No dislocation seen.   Patient name: Justin [name] Patient MRN: [id number] Date: 01/26/24

## 2024-01-26 NOTE — Telephone Encounter (Signed)
 Order placed for [employer].

## 2024-01-27 ENCOUNTER — Ambulatory Visit
Admission: RE | Admit: 2024-01-27 | Discharge: 2024-01-27 | Disposition: A | Discharge status: Home / Self Care | Source: Ambulatory Visit | Attending: Orthopedic Surgery | Admitting: Orthopedic Surgery

## 2024-01-27 DIAGNOSIS — J90 Pleural effusion, not elsewhere classified: Secondary | ICD-10-CM | POA: Diagnosis not present

## 2024-01-27 DIAGNOSIS — M6281 Muscle weakness (generalized): Secondary | ICD-10-CM | POA: Diagnosis not present

## 2024-01-27 DIAGNOSIS — R0781 Pleurodynia: Secondary | ICD-10-CM | POA: Diagnosis not present

## 2024-01-27 DIAGNOSIS — R296 Repeated falls: Secondary | ICD-10-CM | POA: Diagnosis not present

## 2024-01-27 DIAGNOSIS — R2689 Other abnormalities of gait and mobility: Secondary | ICD-10-CM | POA: Diagnosis not present

## 2024-01-27 DIAGNOSIS — R2681 Unsteadiness on feet: Secondary | ICD-10-CM | POA: Diagnosis not present

## 2024-01-27 DIAGNOSIS — J9811 Atelectasis: Secondary | ICD-10-CM | POA: Diagnosis not present

## 2024-01-27 DIAGNOSIS — S2241XA Multiple fractures of ribs, right side, initial encounter for closed fracture: Secondary | ICD-10-CM | POA: Diagnosis not present

## 2024-01-28 ENCOUNTER — Telehealth: Payer: Self-pay | Admitting: Pulmonary Disease

## 2024-01-28 ENCOUNTER — Other Ambulatory Visit: Payer: Self-pay | Admitting: Adult Health

## 2024-01-28 ENCOUNTER — Encounter: Payer: Self-pay | Admitting: Orthopedic Surgery

## 2024-01-28 DIAGNOSIS — M6281 Muscle weakness (generalized): Secondary | ICD-10-CM | POA: Diagnosis not present

## 2024-01-28 DIAGNOSIS — R296 Repeated falls: Secondary | ICD-10-CM | POA: Diagnosis not present

## 2024-01-28 DIAGNOSIS — M549 Dorsalgia, unspecified: Secondary | ICD-10-CM

## 2024-01-28 DIAGNOSIS — R2681 Unsteadiness on feet: Secondary | ICD-10-CM | POA: Diagnosis not present

## 2024-01-28 DIAGNOSIS — R2689 Other abnormalities of gait and mobility: Secondary | ICD-10-CM | POA: Diagnosis not present

## 2024-01-28 MED ORDER — OXYCODONE HCL 5 MG PO TABS: 2.5000 mg | ORAL_TABLET | ORAL | 0 refills | Status: DC | PRN

## 2024-01-28 NOTE — Telephone Encounter (Signed)
 Cmn received from [facility] for O2 concentrator.

## 2024-01-29 DIAGNOSIS — I10 Essential (primary) hypertension: Secondary | ICD-10-CM | POA: Diagnosis not present

## 2024-01-29 DIAGNOSIS — R0602 Shortness of breath: Secondary | ICD-10-CM | POA: Diagnosis not present

## 2024-01-29 DIAGNOSIS — N1831 Chronic kidney disease, stage 3a: Secondary | ICD-10-CM | POA: Diagnosis not present

## 2024-01-30 ENCOUNTER — Other Ambulatory Visit (HOSPITAL_COMMUNITY): Payer: Self-pay | Admitting: Internal Medicine

## 2024-01-30 DIAGNOSIS — R296 Repeated falls: Secondary | ICD-10-CM | POA: Diagnosis not present

## 2024-01-30 DIAGNOSIS — R2689 Other abnormalities of gait and mobility: Secondary | ICD-10-CM | POA: Diagnosis not present

## 2024-01-30 DIAGNOSIS — M6281 Muscle weakness (generalized): Secondary | ICD-10-CM | POA: Diagnosis not present

## 2024-01-30 DIAGNOSIS — R8271 Bacteriuria: Secondary | ICD-10-CM | POA: Diagnosis not present

## 2024-01-30 DIAGNOSIS — R2681 Unsteadiness on feet: Secondary | ICD-10-CM | POA: Diagnosis not present

## 2024-01-30 NOTE — Telephone Encounter (Signed)
 Rc'd signed copy and will fax to [phone]

## 2024-02-02 ENCOUNTER — Inpatient Hospital Stay (HOSPITAL_COMMUNITY): Admission: RE | Admit: 2024-02-02 | Source: Ambulatory Visit

## 2024-02-02 ENCOUNTER — Ambulatory Visit: Attending: Cardiology

## 2024-02-02 DIAGNOSIS — Z95810 Presence of automatic (implantable) cardiac defibrillator: Secondary | ICD-10-CM

## 2024-02-02 DIAGNOSIS — M6281 Muscle weakness (generalized): Secondary | ICD-10-CM | POA: Diagnosis not present

## 2024-02-02 DIAGNOSIS — I5042 Chronic combined systolic (congestive) and diastolic (congestive) heart failure: Secondary | ICD-10-CM

## 2024-02-02 DIAGNOSIS — R296 Repeated falls: Secondary | ICD-10-CM | POA: Diagnosis not present

## 2024-02-02 DIAGNOSIS — R2689 Other abnormalities of gait and mobility: Secondary | ICD-10-CM | POA: Diagnosis not present

## 2024-02-02 DIAGNOSIS — R2681 Unsteadiness on feet: Secondary | ICD-10-CM | POA: Diagnosis not present

## 2024-02-03 NOTE — Progress Notes (Signed)
 EPIC Encounter for ICM Monitoring  Patient Name: Justin [name] is a 89 y.o. male Date: 02/03/2024 Primary Care Physican: Mast, Man X, NP Primary Cardiologist: McAlhany/Sabharwal  Electrophysiologist:  Gareth Junes Pacing: 74.6%  09/27/2022 Weight: 270 lbs 02/12/2023 Weight: 261 lbs 07/07/2023 Office Weight: 261 lbs 10/07/2023 Weight: 245-250 lbs 10/24/2023 Office Weight: 252 lbs   Time in AT/AF  24.0 hr/day (100.0%)   Spoke with daughter Bard Haupert, per [facility] and heart failure questions reviewed.  She reports he seems to be doing okay.     Optivol thoracic impedance suggesting possible fluid accumulation starting 5/15 (slight improvement for a few days after Torsemide  increased to 60 mg daily on 5/28).   Prescribed: Torsemide  20 mg take 2 tablets (40 mg total) by mouth daily (per 5/28 phone note increased to 60 mg daily but not updated in EPIC).   Potassium 20 mEq take 1 tablet by mouth twice a day.  (per 5/28 phone note increased to 60 mEq bid but not updated in EPIC).    Metolazone  2.5 mg Take 1 tablet by mouth once a week on Saturday (per 7/15 phone note should take extra 40 mEq of Potassium with Metolazone ).   Spironolactone  25 mg take 1 tablet daily   Labs: 01/29/2024 Creatinine 1.25, BUN 20, Potassium 4.8, Sodium 135, GFR 56, BNP 454 01/20/2024 Creatinine 1.54, BUN 32, Potassium 3.4, Sodium 129, GFR 43  01/16/2024 BNP 823.5 12/23/2023 Creatinine 1.58, BUN 20, Potassium 3.7, Sodium 132, GFR 42, BNP 579.7 12/16/2023 Creatinine 1.3,   BUN 25, Potassium 3.9, Sodium 134  11/27/2023 Creatinine 1.32, BUN 46, Potassium 3.8, Sodium 134, GFR 52 A complete set of results can be found in Results Review.   Recommendations:  Confirmed he is taking Torsemide  60 mg daily.  Any orders will need to be faxed to [facility] by HF clinic.  Copy sent to Vernia Good, NP at [facility] clinic for review and recommendations.     Follow-up plan: ICM clinic phone appointment on 03/08/2024 (fluid levels will  be checked in HF clinic office 6/20).   91 day device clinic remote transmission 03/02/2024.     EP/Cardiology Office Visits:    02/13/2024 with Dr Bruce Caper.   Recall 04/21/2024 with Dr Rodolfo Clan.      Copy of ICM check sent to Dr Daneil Dunker.   3 month ICM trend: 02/02/2024.    12-14 Month ICM trend:     Almyra Jain, RN 02/03/2024 8:59 AM

## 2024-02-04 DIAGNOSIS — M6281 Muscle weakness (generalized): Secondary | ICD-10-CM | POA: Diagnosis not present

## 2024-02-04 DIAGNOSIS — R2689 Other abnormalities of gait and mobility: Secondary | ICD-10-CM | POA: Diagnosis not present

## 2024-02-04 DIAGNOSIS — R296 Repeated falls: Secondary | ICD-10-CM | POA: Diagnosis not present

## 2024-02-04 DIAGNOSIS — R2681 Unsteadiness on feet: Secondary | ICD-10-CM | POA: Diagnosis not present

## 2024-02-05 ENCOUNTER — Encounter: Payer: Self-pay | Admitting: Cardiovascular Disease

## 2024-02-05 ENCOUNTER — Ambulatory Visit: Attending: Cardiovascular Disease | Admitting: Cardiovascular Disease

## 2024-02-05 ENCOUNTER — Ambulatory Visit: Admitting: Orthopedic Surgery

## 2024-02-05 VITALS — BP 92/50 | HR 72 | Ht 66.5 in | Wt 255.0 lb

## 2024-02-05 DIAGNOSIS — I10 Essential (primary) hypertension: Secondary | ICD-10-CM | POA: Diagnosis not present

## 2024-02-05 DIAGNOSIS — I2510 Atherosclerotic heart disease of native coronary artery without angina pectoris: Secondary | ICD-10-CM

## 2024-02-05 DIAGNOSIS — I255 Ischemic cardiomyopathy: Secondary | ICD-10-CM

## 2024-02-05 DIAGNOSIS — R296 Repeated falls: Secondary | ICD-10-CM | POA: Diagnosis not present

## 2024-02-05 DIAGNOSIS — R2681 Unsteadiness on feet: Secondary | ICD-10-CM | POA: Diagnosis not present

## 2024-02-05 DIAGNOSIS — E7849 Other hyperlipidemia: Secondary | ICD-10-CM | POA: Diagnosis not present

## 2024-02-05 DIAGNOSIS — I5042 Chronic combined systolic (congestive) and diastolic (congestive) heart failure: Secondary | ICD-10-CM | POA: Diagnosis not present

## 2024-02-05 DIAGNOSIS — R2689 Other abnormalities of gait and mobility: Secondary | ICD-10-CM | POA: Diagnosis not present

## 2024-02-05 DIAGNOSIS — Z952 Presence of prosthetic heart valve: Secondary | ICD-10-CM

## 2024-02-05 DIAGNOSIS — I4821 Permanent atrial fibrillation: Secondary | ICD-10-CM | POA: Diagnosis not present

## 2024-02-05 DIAGNOSIS — M6281 Muscle weakness (generalized): Secondary | ICD-10-CM | POA: Diagnosis not present

## 2024-02-05 NOTE — Progress Notes (Signed)
 Chief Complaint  Patient presents with   Follow-up    Aortic stenosis, CAD   History of Present Illness: 89 yo male with history of HTN, HLD, severe aortic stenosis s/p TAVR, PAF, chronic diastolic and systolic CHF, CAD s/p 4V CABG, syncope, junctional bradycardia, PVCs, non-sustained VT and LBBB s/p ICD implantation who is here today for cardiac follow up. He is known to have CAD s/p 4V CABG in March 2007 (LIMA to LAD, SVG to Diagonal, SVG to OM, SVG to RCA). Last cardiac cath in January 2018 with 4/4 patent bypass grafts. He had progression of his aortic stenosis with worsened dyspnea and acute on chronic CHF in the spring of 2018. Echo May 2018 with mildly reduced LV systolic function, LVEF=40-45%. His aortic stenosis was severe. He underwent TAVR on 03/04/17 with a 29 mm Edwards Sapien 3 valve from the right transfemoral approach.  He developed atrial fibrillation following his procedure and was placed on IV amiodarone  with conversion to sinus rhythm. Follow up visit in our office 03/19/17 and pt noted to have irregular rhythm with intraventricular conduction delay and was felt by our EP team to represent 2:1 conduction of competing fast and slow pathways. Cardiac monitor with sinus rhythm with periods of junctional rhythm, frequent PVCs, frequent PACs and runs of a wide complex tachycardia. ICD was implanted on 05/03/17. Echo September 2020 with LVEF=35-40%. Carotid artery dopplers March 2021 with mld bilateral carotid artery disease. Echo January 2023 with LVEF=40-45%.  He converted back to atrial fib and was cardioverted to sinus March 2023. He had recurrent atrial flutter in April 2023, was loaded with amiodarone  and was cardioverted again in June 2023. He converted back to atrial fib in July 2023 and was paced out of it in the office. He had evidence of volume overload when seen by Dr. Rodolfo Clan in March 2024 and Torsemide  was increased. He was restarted on amiodarone . Echo March 2024 LVEF=40-45%. AVR  working well. He was seen in the Advanced Heart Failure clinic 11/25/22 by Dr. Bruce Caper and his volume status was felt to be ok. Continued on daily Torsemide  40 mg and metolazone  2.5 mg twice weekly. He was started on Entresto  and Farxiga  and continued on Toprol , Imdur  and aldactone . ICD generator change 05/30/23. He was admitted to [facility] with volume overload on 06/30/23. He was diuresed 4 liters of fluid with clinical improvement. Echo with LVEF=40-45%. He was admitted to [facility] in March 2025 with pneumonia. Echo March 2025 with LVEF=40-45%. Large pleural effusion and thoracentesis performed. He was seen in the Advanced Heart Failure clinic 01/16/24 and was doing well.   He is here today for follow up. The patient denies any chest pain, palpitations, orthopnea, PND, dizziness, near syncope or syncope. He has gained a few lbs and is having more dyspnea.     Primary Care Physician: Mast, Man X, NP  Past Medical History:  Diagnosis Date   Age-related macular degeneration, dry, both eyes    Allergic    24/7; 365 days/year; I'm allergic to pollens, dust, all southern grasses/trees, mold, mildue, cats, dogs (01/07/2017)   Anal fissure    Asthma    sees Dr. Elverna Hamman    Benign prostatic hypertrophy    (sees Dr. Verdel Gitelman   CAD (coronary artery disease)    a. s/p CABG 2007. b. Cath 08/2016 - 4/4 patent grafts.   Carotid bruit    carotid u/s 10/10: 0.39% bilaterally   Chronic combined systolic and diastolic CHF (congestive heart failure) (  HCC)    Complication of anesthesia 1980s   w/anal cyst OR, he gave me a saddle block then put a narcotic in spinal cord; had a severe reaction to that (01/07/2017)   Congestive heart failure (CHF) [facility])    ED (erectile dysfunction)    Family history of adverse reaction to anesthesia    daughter wakes up during OR (01/07/2017)   GERD (gastroesophageal reflux disease)    Gout    HTN (hypertension)    Hx of colonic polyps    (sees Dr. Elvin Hammer)    Hyperlipidemia    Hypothyroidism    Moderate to severe aortic stenosis    a. s/p TAVR 02/2017.   Myocardial infarction [facility]) ~ 2000   Obesity    Osteoarthritis    was in my knees, hands (01/07/2017 )   PAF (paroxysmal atrial fibrillation) (HCC)    a. documented post TAVR.   Precancerous skin lesion    (sees Dr. Fleurette Humbles)   Prostate cancer [facility]) dx'd ~ 2014   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   29 mm Edwards Sapien 3 transcatheter heart valve placed via percutaneous right transfemoral approach    Past Surgical History:  Procedure Laterality Date   ANUS SURGERY     opened it back up cause it wouldn't heal; wound up w/a fissure (01/07/2017)   BIV ICD INSERTION CRT-D N/A 05/02/2017   Procedure: BIV ICD INSERTION CRT-D;  Surgeon: Verona Goodwill, MD;  Location: [facility] INVASIVE CV LAB;  Service: Cardiovascular;  Laterality: N/A;   CARDIAC CATHETERIZATION  10/29/2005   CARDIAC CATHETERIZATION N/A 08/27/2016   Procedure: Right/Left Heart Cath and Coronary/Graft Angiography;  Surgeon: Odie Benne, MD;  Location: [facility] INVASIVE CV LAB;  Service: Cardiovascular;  Laterality: N/A;   CARDIOVERSION N/A 04/24/2021   Procedure: CARDIOVERSION;  Surgeon: Elmyra Haggard, MD;  Location: [facility] ENDOSCOPY;  Service: Cardiovascular;  Laterality: N/A;   CARDIOVERSION N/A 11/08/2021   Procedure: CARDIOVERSION;  Surgeon: Hazle Lites, MD;  Location: [facility] ENDOSCOPY;  Service: Cardiovascular;  Laterality: N/A;   CARDIOVERSION N/A 02/06/2022   Procedure: CARDIOVERSION;  Surgeon: Loyde Rule, MD;  Location: [facility] ENDOSCOPY;  Service: Cardiovascular;  Laterality: N/A;   CATARACT EXTRACTION W/ INTRAOCULAR LENS  IMPLANT, BILATERAL Bilateral    CATARACT EXTRACTION, BILATERAL  2012   COLONOSCOPY  06/30/2008   no repeats needed    COLONOSCOPY     had 3 or 4 in the past    CORONARY ARTERY BYPASS GRAFT  2007   CABG X4   CYST EXCISION PERINEAL  1980s   HAMMER TOE SURGERY  Bilateral    ICD GENERATOR CHANGEOUT N/A 05/30/2023   Procedure: ICD GENERATOR CHANGEOUT;  Surgeon: Verona Goodwill, MD;  Location: [facility] INVASIVE CV LAB;  Service: Cardiovascular;  Laterality: N/A;   JOINT REPLACEMENT     KNEE ARTHROPLASTY  07/30/2011   Procedure: COMPUTER ASSISTED TOTAL KNEE ARTHROPLASTY;  Surgeon: Jasmine Mesi;  Location: [facility] OR;  Service: Orthopedics;  Laterality: Left;  left total knee arthroplasty   MASTECTOMY SUBCUTANEOUS Bilateral    MULTIPLE TOOTH EXTRACTIONS     ORIF FINGER / THUMB FRACTURE Right ~ 1980   repair of thumb injury   PROSTATE BIOPSY     REPLACEMENT TOTAL KNEE BILATERAL Bilateral 2012   TEE WITHOUT CARDIOVERSION N/A 03/04/2017   Procedure: TRANSESOPHAGEAL ECHOCARDIOGRAM (TEE);  Surgeon: Odie Benne, MD;  Location[facility] OR;  Service: Open Heart Surgery;  Laterality: N/A;  TOTAL KNEE REVISION Right 05/18/2019   Procedure: RIGHT PATELLA REVISION/REMOVAL;  Surgeon: Jasmine Mesi, MD;  Location: [facility] OR;  Service: Orthopedics;  Laterality: Right;   TRANSCATHETER AORTIC VALVE REPLACEMENT, TRANSFEMORAL N/A 03/04/2017   Procedure: TRANSCATHETER AORTIC VALVE REPLACEMENT, TRANSFEMORAL;  Surgeon: Odie Benne, MD;  Location: [facility] OR;  Service: Open Heart Surgery;  Laterality: N/A;    Current Outpatient Medications  Medication Sig Dispense Refill   albuterol  (VENTOLIN  HFA) 108 (90 Base) MCG/ACT inhaler Inhale 2 puffs into the lungs every 4 (four) hours as needed for wheezing or shortness of breath. 8 g 5   allopurinol  (ZYLOPRIM ) 100 MG tablet Take 1 tablet (100 mg total) by mouth daily. 90 tablet 3   amiodarone  (PACERONE ) 200 MG tablet Take 1 tablet (200 mg total) by mouth daily. 90 tablet 3   aspirin  81 MG tablet Take 1 tablet (81 mg total) by mouth daily. 30 tablet 0   atorvastatin  (LIPITOR) 20 MG tablet TAKE ONE TABLET ONCE DAILY 90 tablet 2   Baclofen 5 MG TABS Take 5 mg by mouth every 12 (twelve) hours as needed.     benzonatate   (TESSALON ) 100 MG capsule Take 100 mg by mouth 2 (two) times daily.     budesonide -formoterol  (SYMBICORT ) 80-4.5 MCG/ACT inhaler Inhale 2 puffs into the lungs.     calcium  carbonate (OS-CAL) 1250 (500 Ca) MG chewable tablet Chew 1 tablet by mouth daily.     cetirizine (ZYRTEC) 10 MG tablet Take 10 mg by mouth 2 (two) times daily. As needed     colchicine  0.6 MG tablet Take 1 tablet (0.6 mg total) by mouth every 6 (six) hours as needed (gout). 60 tablet 5   Continuous Glucose Receiver (DEXCOM G6 RECEIVER) DEVI 1 Device by Does not apply route 3 (three) times daily. E11.69 1 each 11   Continuous Glucose Sensor (DEXCOM G6 SENSOR) MISC 1 Device by Does not apply route 3 (three) times daily. E11.69 3 each 11   Continuous Glucose Transmitter (DEXCOM G6 TRANSMITTER) MISC 1 Device by Does not apply route 3 (three) times daily. E11.69 1 each 11   dabigatran  (PRADAXA ) 150 MG CAPS capsule TAKE (1) CAPSULE TWICE DAILY. 180 capsule 2   diclofenac  Sodium (VOLTAREN ) 1 % GEL Apply 4 g topically 3 (three) times daily as needed (pain). 50 g 0   docusate sodium  (COLACE) 100 MG capsule Take 100 mg by mouth 2 (two) times daily.     fluticasone  (FLONASE ) 50 MCG/ACT nasal spray Place 2 sprays into both nostrils daily.     Fluticasone  Furoate (ARNUITY ELLIPTA) 100 MCG/ACT AEPB 1 puff.     insulin  aspart (NOVOLOG ) 100 UNIT/ML injection Inject 4 Units into the skin 3 (three) times daily before meals. Inject as per sliding scale: if 141 - 180 = 4 units Inject per sliding scale; 181 - 220 = 6 units Inject per sliding scale; 221 - 260 = 8 units Inject per sliding scale; 261 - 300 = 10 units Inject per sliding scale; 301 - 350 = 12 units Inject per sliding scale; 351 - 400= 14 units Inject per sliding scale; 401+ = 16 units Inject per sliding scale, subcutaneously three times a day     ipratropium (ATROVENT ) 0.03 % nasal spray Place 2 sprays into both nostrils 2 (two) times daily.     isosorbide  mononitrate (IMDUR ) 30 MG 24 hr  tablet Take 1 tablet (30 mg total) by mouth daily. 90 tablet 3   ketotifen  (ZADITOR ) 0.025 % ophthalmic  solution Place 1 drop into both eyes daily as needed (for itching).     levothyroxine  (SYNTHROID ) 25 MCG tablet Take 25 mcg by mouth daily.     levothyroxine  (SYNTHROID ) 75 MCG tablet Take 75 mcg by mouth daily. Take 2 tablets once daily     magic mouthwash SOLN Take 5 mLs by mouth as directed. Suspension contains equal amounts of Maalox Extra Strength, nystatin, and diphenhydramine . RX'ed by dentist     methocarbamol  (ROBAXIN ) 500 MG tablet Take 1 tablet (500 mg total) by mouth 4 (four) times daily as needed for muscle spasms. 50 tablet 0   metoCLOPramide  (REGLAN ) 10 MG tablet Take 10 mg by mouth daily.     metolazone  (ZAROXOLYN ) 2.5 MG tablet Take 1 tablet (2.5 mg total) by mouth once a week every Saturday 12 tablet 3   metoprolol  succinate (TOPROL -XL) 50 MG 24 hr tablet Take 1 tablet (50 mg total) by mouth daily. Take with or immediately following a meal.     montelukast  (SINGULAIR ) 10 MG tablet TAKE ONE TABLET BY MOUTH ONCE DAILY IN THE EVENING 90 tablet 3   Multiple Minerals-Vitamins (CAL MAG ZINC +D3 PO) Take 2 tablets by mouth in the morning. 500 mg / 7 mg / 25 mcg     Multiple Vitamins-Minerals (MULTI-VITAMIN GUMMIES) CHEW Chew 2 tablets by mouth See admin instructions. Chew 2 gummies by mouth in the morning     Multiple Vitamins-Minerals (PRESERVISION AREDS 2) CAPS Take 1 capsule by mouth in the morning and at bedtime.     nitrofurantoin (MACRODANTIN) 100 MG capsule Take 100 mg by mouth 2 (two) times daily.     nystatin (MYCOSTATIN/NYSTOP) powder Apply 1 Application topically 2 (two) times daily.     oxyCODONE  (ROXICODONE ) 5 MG immediate release tablet Take 0.5 tablets (2.5 mg total) by mouth every 4 (four) hours as needed for severe pain (pain score 7-10). 30 tablet 0   pantoprazole  (PROTONIX ) 40 MG tablet Take 1 tablet (40 mg total) by mouth daily. 30 tablet 3   polyethylene glycol  powder (GLYCOLAX/MIRALAX) 17 GM/SCOOP powder Take by mouth every 12 (twelve) hours as needed for moderate constipation.     potassium chloride  SA (KLOR-CON  M) 20 MEQ tablet Take 1 tablet (20 mEq total) by mouth 2 (two) times daily. 180 tablet 3   sacubitril-valsartan (ENTRESTO ) 24-26 MG TAKE 1/2 TABLET TWICE DAILY     silver  sulfADIAZINE  (SILVADENE ) 1 % cream Apply 1 Application topically 2 (two) times daily. 50 g 5   spironolactone  (ALDACTONE ) 25 MG tablet Take 1 tablet (25 mg total) by mouth daily. 30 tablet 9   torsemide  (DEMADEX ) 20 MG tablet Take 2 tablets (40 mg total) by mouth daily.     triamcinolone  cream (KENALOG ) 0.1 % Apply 1 application  topically daily as needed for dry skin (affected areas).     valACYclovir  (VALTREX ) 500 MG tablet Take 500 mg by mouth 2 (two) times daily as needed (cold sores).     venlafaxine  XR (EFFEXOR  XR) 37.5 MG 24 hr capsule Take 1 capsule (37.5 mg total) by mouth daily with breakfast. 30 capsule 1   No current facility-administered medications for this visit.    Allergies  Allergen Reactions   Peanut-Containing Drug Products Anaphylaxis   Sulfonamide Derivatives Anaphylaxis   Amlodipine  Swelling and Other (See Comments)    Swelling in ankles   Eliquis  [Apixaban ] Other (See Comments)    Back/hip pain   Lisinopril  Cough   Xarelto  [Rivaroxaban ] Other (See Comments)  Back/hip pain    Social History   Socioeconomic History   Marital status: Married    Spouse name: Not on file   Number of children: 3   Years of education: Not on file   Highest education level: Not on file  Occupational History   Occupation: Product/process development scientist    Comment: builds malls  Tobacco Use   Smoking status: Former    Current packs/day: 0.00    Average packs/day: 3.5 packs/day for 13.0 years (45.5 ttl pk-yrs)    Types: Cigarettes    Start date: 36    Quit date: 1963    Years since quitting: 62.4   Smokeless tobacco: Never   Tobacco comments:    Former smoker  04/19/21  Vaping Use   Vaping status: Never Used  Substance and Sexual Activity   Alcohol use: Not Currently    Alcohol/week: 7.0 standard drinks of alcohol    Types: 7 Cans of beer per week    Comment: 1 beer daily after 5pm (04/19/21)   Drug use: No   Sexual activity: Never  Other Topics Concern   Not on file  Social History Narrative   FH of CAD, Male 1st degree relative less than age 52.   Social Drivers of Corporate investment banker Strain: Low Risk  (03/31/2023)   Overall Financial Resource Strain (CARDIA)    Difficulty of Paying Living Expenses: Not very hard  Food Insecurity: No Food Insecurity (11/18/2023)   Hunger Vital Sign    Worried About Running Out of Food in the Last Year: Never true    Ran Out of Food in the Last Year: Never true  Transportation Needs: No Transportation Needs (11/18/2023)   PRAPARE - Administrator, Civil Service (Medical): No    Lack of Transportation (Non-Medical): No  Physical Activity: Sufficiently Active (03/31/2023)   Exercise Vital Sign    Days of Exercise per Week: 7 days    Minutes of Exercise per Session: 30 min  Stress: No Stress Concern Present (03/31/2023)   [employer] of Occupational Health - Occupational Stress Questionnaire    Feeling of Stress : Not at all  Social Connections: Socially Integrated (11/18/2023)   Social Connection and Isolation Panel    Frequency of Communication with Friends and Family: Three times a week    Frequency of Social Gatherings with Friends and Family: Three times a week    Attends Religious Services: More than 4 times per year    Active Member of Clubs or Organizations: Yes    Attends Banker Meetings: More than 4 times per year    Marital Status: Married  Catering manager Violence: Not At Risk (11/17/2023)   Humiliation, Afraid, Rape, and Kick questionnaire    Fear of Current or Ex-Partner: No    Emotionally Abused: No    Physically Abused: No    Sexually Abused: No     Family History  Problem Relation Age of Onset   Heart attack Father 66   Allergic rhinitis Father    Asthma Father    Heart failure Mother 34   Uterine cancer Mother    Breast cancer Mother    Colon cancer Neg Hx    Esophageal cancer Neg Hx     Review of Systems:  As stated in the HPI and otherwise negative.   BP (!) 92/50   Pulse 72   Ht 5' 6.5 (1.689 m)   Wt 255 lb (115.7 kg)  SpO2 97%   BMI 40.54 kg/m   Physical Examination: General: Well developed, well nourished, NAD  HEENT: OP clear, mucus membranes moist  SKIN: warm, dry. No rashes. Neuro: No focal deficits  Musculoskeletal: Muscle strength 5/5 all ext  Psychiatric: Mood and affect normal  Neck: No JVD, no carotid bruits, no thyromegaly, no lymphadenopathy.  Lungs:Clear bilaterally, no wheezes, rhonci, crackles Cardiovascular: Regular rate and rhythm. Soft systolic murmur.  Abdomen:Soft. Bowel sounds present. Non-tender.  Extremities: 1+ bilateral lower extremity edema. Pulses are 2 + in the bilateral DP/PT.  EKG:  EKG is not ordered today The ekg ordered today demonstrates    Recent Labs: 11/18/2023: TSH 0.169 11/27/2023: Magnesium  2.5 01/16/2024: B Natriuretic Peptide 823.5 01/20/2024: ALT 31; BUN 32; Creatinine, Ser 1.54; Hemoglobin 14.0; Platelets 345; Potassium 3.4; Sodium 129   Lipid Panel    Component Value Date/Time   CHOL 108 10/14/2023 0729   CHOL 171 08/11/2018 0735   TRIG 113 10/14/2023 0729   TRIG 143 06/30/2006 1132   HDL 33 (L) 10/14/2023 0729   HDL 34 (L) 08/11/2018 0735   CHOLHDL 3.3 10/14/2023 0729   VLDL 19 07/03/2023 0426   LDLCALC 55 10/14/2023 0729     Wt Readings from Last 3 Encounters:  02/05/24 255 lb (115.7 kg)  01/26/24 244 lb (110.7 kg)  01/21/24 244 lb (110.7 kg)    Assessment and Plan:   1. CAD s/p CABG without angina: No chest pain suggestive of angina. Continue ASA, statin, beta blocker and Imdur .           2. Aortic valve stenosis: He is s/p TAVR in July  2018. AVR working well by echo in March 2025. No PVL. He will continue SBE prophylaxis as indicated.    3. HTN: BP is controlled. Continue current therapy  4. HLD: LDL at goal in November 2024. Continue statin  5. Chronic systolic and diastolic CHF/Ischemic cardiomyopathy:  He is volume overloaded. Continue torsemide  40 mg daily. Extra metolazone  2.5 mg once today then continue metolazone  every Saturday. Continue Toprol , Losartan , Farxiga  and aldactone .  Follow up in the Advanced heart failure clinic as planned later this month.    6. Atrial fibrillation, persistent: Rate controlled today. Continue amiodarone , Toprol  and Pradaxa .   7. Carotid artery disease:  Mild bilateral carotid artery disease by dopplers March 2021. Will not repeat dopplers given advanced age.   Labs/ tests ordered today include:   No orders of the defined types were placed in this encounter.  Disposition:  Follow up with me in 6 months.   Signed, Antoinette Batman, MD 02/05/2024 4:38 PM    [facility] [address] [place], [place], [place]  [address] Phone: [phone]; Fax: [phone]

## 2024-02-05 NOTE — Patient Instructions (Signed)
 Medication Instructions:  No changes - --take metolazone  today and take next dose on Sunday  *If you need a refill on your cardiac medications before your next appointment, please call your pharmacy*  Lab Work: none   Testing/Procedures: none  Follow-Up: At [facility], you and your health needs are our priority.  As part of our continuing mission to provide you with exceptional heart care, our providers are all part of one team.  This team includes your primary Cardiologist (physician) and Advanced Practice Providers or APPs (Physician Assistants and Nurse Practitioners) who all work together to provide you with the care you need, when you need it.  Your next appointment:   6 month(s)  Provider:   Antoinette Batman, MD

## 2024-02-06 ENCOUNTER — Ambulatory Visit: Admitting: Orthopedic Surgery

## 2024-02-06 ENCOUNTER — Encounter: Payer: Self-pay | Admitting: Orthopedic Surgery

## 2024-02-06 ENCOUNTER — Other Ambulatory Visit (INDEPENDENT_AMBULATORY_CARE_PROVIDER_SITE_OTHER)

## 2024-02-06 ENCOUNTER — Encounter: Payer: Self-pay | Admitting: Cardiology

## 2024-02-06 DIAGNOSIS — M25561 Pain in right knee: Secondary | ICD-10-CM

## 2024-02-06 DIAGNOSIS — M25562 Pain in left knee: Secondary | ICD-10-CM

## 2024-02-06 NOTE — Progress Notes (Signed)
 Pt had office visit with Dr Abel Hoe on 6/12 and per note he was overloaded by exam, wt gain and dyspnea  Was instructed to take extra Metolazone  on 6/12.    Copy of update sent to Vernia Good, NP at [facility] clinic.  Pt has office visit on 6/20 with Dr Bruce Caper.

## 2024-02-06 NOTE — Progress Notes (Unsigned)
 Office Visit Note   Patient: Justin [name]           Date of Birth: 1934-02-15           MRN: [id number] Visit Date: 02/06/2024 Requested by: Mast, Man X, NP [id number] [place],  [place] [address] PCP: Mast, Man X, NP  Subjective: Chief Complaint  Patient presents with   Other    Bilateral knee pain, weakness, giving way, popping and instability. Left worse than right. I feel like my knees are going to pop out    HPI: Justin [name] is a 89 y.o. male who presents to the office reporting left knee pain.  He feels like the knee popped out of joint.  Had 3 clicks and then it went back in the place.  Pain is anterior lateral.  Has a history of right knee patellar revision.  Right knee is irritated but he denies any mechanical symptoms..                ROS: All systems reviewed are negative as they relate to the chief complaint within the history of present illness.  Patient denies fevers or chills.  Assessment & Plan: Visit Diagnoses:  1. Pain in both knees, unspecified chronicity     Plan: Impression is left knee pain which is primarily patellofemoral in origin.  No instability and radiographs are on revealing.  Need CT scan to evaluate the patella.  1 make sure there is no loosening.  Unlike his right knee problems 5 years ago there is no effusion in the left knee today.  Will see what the CT scan shows.  Follow-Up Instructions: No follow-ups on file.   Orders:  Orders Placed This Encounter  Procedures   XR Knee 1-2 Views Right   XR Knee 1-2 Views Left   No orders of the defined types were placed in this encounter.     Procedures: No procedures performed   Clinical Data: No additional findings.  Objective: Vital Signs: There were no vitals taken for this visit.  Physical Exam:  Constitutional: Patient appears well-developed HEENT:  Head: Normocephalic Eyes:EOM are normal Neck: Normal range of motion Cardiovascular: Normal rate Pulmonary/chest:  Effort normal Neurologic: Patient is alert Skin: Skin is warm Psychiatric: Patient has normal mood and affect  Ortho Exam: Ortho exam demonstrates no effusion in the left or right knee.  Extensor mechanism is intact.  No patellar apprehension on the left.  Collaterals are stable to varus valgus stress at 0 and 30 degrees.  Range of motion is about 3 to 100 degrees.  Not too much in terms of coarseness with passive or active range of motion in that left knee patellofemoral joint.  Specialty Comments:  No specialty comments available.  Imaging: XR Knee 1-2 Views Right Result Date: 02/06/2024 AP lateral radiographs right knee reviewed.  Total knee prosthesis in good position alignment with no complicating features.  XR Knee 1-2 Views Left Result Date: 02/06/2024 AP lateral radiographs left knee reviewed.  Total knee prosthesis in good position alignment no complicating features.    PMFS History: Patient Active Problem List   Diagnosis Date Noted   UTI (urinary tract infection) 12/19/2023   Recurrent depression (HCC) 12/19/2023   Candidiasis 12/15/2023   Pleural effusion 11/20/2023   Asymptomatic bacteriuria 11/20/2023   Viral pneumonia 11/19/2023   Acute hypoxemic respiratory failure (HCC) 11/18/2023   Coronavirus infection 11/18/2023   Acute respiratory failure with hypoxia (HCC) 11/17/2023   Bronchitis 11/06/2023  Type 2 diabetes mellitus (HCC) 10/23/2023   Leukocytosis 10/16/2023   MDD (major depressive disorder) 10/09/2023   Permanent atrial fibrillation (HCC) 09/01/2023   Right ventricular dysfunction 07/05/2023   Biventricular cardiac pacemaker in situ 07/05/2023   Chronic kidney disease, stage 3a (HCC) 07/05/2023   Persistent atrial fibrillation (HCC) 07/05/2023   Long term (current) use of anticoagulants 07/05/2023   Hx of CABG 07/05/2023   Atherosclerosis of native coronary artery of native heart without angina pectoris 07/05/2023   Acute exacerbation of congestive  heart failure (HCC) 06/30/2023   NSVT (nonsustained ventricular tachycardia) (HCC) 05/23/2022   Sinus node dysfunction (HCC) 05/23/2022   Frequent PVCs 05/23/2022   Acute on chronic combined systolic and diastolic heart failure (HCC) 03/12/2022   Biventricular implantable cardioverter-defibrillator (ICD) in situ 03/12/2022   Atypical atrial flutter (HCC) 01/30/2022   Paroxysmal atrial fibrillation (HCC)    Secondary hypercoagulable state (HCC) 04/19/2021   Raynaud's phenomenon without gangrene 07/11/2020   Vertigo 07/11/2020   Loosening of knee joint prosthesis (HCC) 05/18/2019   Cough 04/30/2019   Gastroesophageal reflux disease 04/30/2019   S/P ICD (internal cardiac defibrillator) procedure 05/03/2017   NICM (nonischemic cardiomyopathy) (HCC) 05/02/2017   History of transcatheter aortic valve replacement (TAVR) 03/04/2017   Severe aortic valve stenosis 03/04/2017   Hypokalemia due to loss of potassium with diuresis 01/10/2017   Aortic stenosis 01/10/2017   Chronic systolic CHF (congestive heart failure) (HCC) 01/10/2017   Chronic congestive heart failure (HCC) 01/07/2017   Hypothyroidism 12/23/2016   Prostate cancer (HCC)    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   Edema of both legs 09/01/2014   Asthma, intermittent 09/01/2014   Epiphora due to insufficient drainage 07/02/2011   Rosacea blepharoconjunctivitis 07/02/2011   Coronary artery disease involving native coronary artery of native heart without angina pectoris    GOUT 09/12/2009   CAD, AUTOLOGOUS BYPASS GRAFT 05/10/2009   MURMUR 05/08/2009   BRUIT 05/08/2009   MUSCLE CRAMPS 12/12/2008   KNEE SPRAIN 09/21/2008   Osteoarthritis 07/07/2008   BPH with urinary obstruction 02/01/2008   LOW BACK PAIN SYNDROME 02/01/2008   TESTOSTERONE DEFICIENCY 07/03/2007   Hyperlipidemia 07/03/2007   Essential hypertension 07/03/2007   MYOCARDIAL INFARCTION, HX OF 07/03/2007   Allergic rhinitis 07/03/2007   History of colonic polyps  07/03/2007   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   Past Medical History:  Diagnosis Date   Age-related macular degeneration, dry, both eyes    Allergic    24/7; 365 days/year; I'm allergic to pollens, dust, all southern grasses/trees, mold, mildue, cats, dogs (01/07/2017)   Anal fissure    Asthma    sees Dr. Elverna Hamman    Benign prostatic hypertrophy    (sees Dr. Verdel Gitelman   CAD (coronary artery disease)    a. s/p CABG 2007. b. Cath 08/2016 - 4/4 patent grafts.   Carotid bruit    carotid u/s 10/10: 0.39% bilaterally   Chronic combined systolic and diastolic CHF (congestive heart failure) (HCC)    Complication of anesthesia 1980s   w/anal cyst OR, he gave me a saddle block then put a narcotic in spinal cord; had a severe reaction to that (01/07/2017)   Congestive heart failure (CHF) [facility])    ED (erectile dysfunction)    Family history of adverse reaction to anesthesia    daughter wakes up during OR (01/07/2017)   GERD (gastroesophageal reflux disease)    Gout    HTN (hypertension)    Hx of colonic polyps    (sees Dr. Elvin Hammer)  Hyperlipidemia    Hypothyroidism    Moderate to severe aortic stenosis    a. s/p TAVR 02/2017.   Myocardial infarction [facility]) ~ 2000   Obesity    Osteoarthritis    was in my knees, hands (01/07/2017 )   PAF (paroxysmal atrial fibrillation) (HCC)    a. documented post TAVR.   Precancerous skin lesion    (sees Dr. 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   29 mm Edwards Sapien 3 transcatheter heart valve placed via percutaneous right transfemoral approach    Family History  Problem Relation Age of Onset   Heart attack Father 87   Allergic rhinitis Father    Asthma Father    Heart failure Mother 43   Uterine cancer Mother    Breast cancer Mother    Colon cancer Neg Hx    Esophageal cancer Neg Hx     Past Surgical History:  Procedure Laterality Date   ANUS SURGERY      opened it back up cause it wouldn't heal; wound up w/a fissure (01/07/2017)   BIV ICD INSERTION CRT-D N/A 05/02/2017   Procedure: BIV ICD INSERTION CRT-D;  Surgeon: Verona Goodwill, MD;  Location: [facility] INVASIVE CV LAB;  Service: Cardiovascular;  Laterality: N/A;   CARDIAC CATHETERIZATION  10/29/2005   CARDIAC CATHETERIZATION N/A 08/27/2016   Procedure: Right/Left Heart Cath and Coronary/Graft Angiography;  Surgeon: Odie Benne, MD;  Location: [facility] INVASIVE CV LAB;  Service: Cardiovascular;  Laterality: N/A;   CARDIOVERSION N/A 04/24/2021   Procedure: CARDIOVERSION;  Surgeon: Elmyra Haggard, MD;  Location: [facility] ENDOSCOPY;  Service: Cardiovascular;  Laterality: N/A;   CARDIOVERSION N/A 11/08/2021   Procedure: CARDIOVERSION;  Surgeon: Hazle Lites, MD;  Location: [facility] ENDOSCOPY;  Service: Cardiovascular;  Laterality: N/A;   CARDIOVERSION N/A 02/06/2022   Procedure: CARDIOVERSION;  Surgeon: Loyde Rule, MD;  Location: [facility] ENDOSCOPY;  Service: Cardiovascular;  Laterality: N/A;   CATARACT EXTRACTION W/ INTRAOCULAR LENS  IMPLANT, BILATERAL Bilateral    CATARACT EXTRACTION, BILATERAL  2012   COLONOSCOPY  06/30/2008   no repeats needed    COLONOSCOPY     had 3 or 4 in the past    CORONARY ARTERY BYPASS GRAFT  2007   CABG X4   CYST EXCISION PERINEAL  1980s   HAMMER TOE SURGERY Bilateral    ICD GENERATOR CHANGEOUT N/A 05/30/2023   Procedure: ICD GENERATOR CHANGEOUT;  Surgeon: Verona Goodwill, MD;  Location: [facility] INVASIVE CV LAB;  Service: Cardiovascular;  Laterality: N/A;   JOINT REPLACEMENT     KNEE ARTHROPLASTY  07/30/2011   Procedure: COMPUTER ASSISTED TOTAL KNEE ARTHROPLASTY;  Surgeon: Jasmine Mesi;  Location: [facility] OR;  Service: Orthopedics;  Laterality: Left;  left total knee arthroplasty   MASTECTOMY SUBCUTANEOUS Bilateral    MULTIPLE TOOTH EXTRACTIONS     ORIF FINGER / THUMB FRACTURE Right ~ 1980   repair of thumb injury   PROSTATE BIOPSY     REPLACEMENT TOTAL KNEE BILATERAL  Bilateral 2012   TEE WITHOUT CARDIOVERSION N/A 03/04/2017   Procedure: TRANSESOPHAGEAL ECHOCARDIOGRAM (TEE);  Surgeon: Odie Benne, MD;  Location: [facility] OR;  Service: Open Heart Surgery;  Laterality: N/A;   TOTAL KNEE REVISION Right 05/18/2019   Procedure: RIGHT PATELLA REVISION/REMOVAL;  Surgeon: Jasmine Mesi, MD;  Location[facility] OR;  Service: Orthopedics;  Laterality: Right;   TRANSCATHETER AORTIC VALVE REPLACEMENT,  TRANSFEMORAL N/A 03/04/2017   Procedure: TRANSCATHETER AORTIC VALVE REPLACEMENT, TRANSFEMORAL;  Surgeon: Odie Benne, MD;  Location: [facility] OR;  Service: Open Heart Surgery;  Laterality: N/A;   Social History   Occupational History   Occupation: Product/process development scientist    Comment: builds malls  Tobacco Use   Smoking status: Former    Current packs/day: 0.00    Average packs/day: 3.5 packs/day for 13.0 years (45.5 ttl pk-yrs)    Types: Cigarettes    Start date: 47    Quit date: 1963    Years since quitting: 62.4   Smokeless tobacco: Never   Tobacco comments:    Former smoker 04/19/21  Vaping Use   Vaping status: Never Used  Substance and Sexual Activity   Alcohol use: Not Currently    Alcohol/week: 7.0 standard drinks of alcohol    Types: 7 Cans of beer per week    Comment: 1 beer daily after 5pm (04/19/21)   Drug use: No   Sexual activity: Never

## 2024-02-09 ENCOUNTER — Encounter: Payer: Self-pay | Admitting: Sports Medicine

## 2024-02-09 NOTE — Progress Notes (Unsigned)
 Provider:  Dr. Nathaneil Bakes Location:   [facility]ursing Home Room Number: 913 [id number] Place of Service:  ALF (13)  PCP: Mast, Man X, NP Patient Care Team: Mast, Man X, NP as PCP - General (Internal Medicine) Odie Benne, MD as PCP - Cardiology (Cardiology) Verona Goodwill, MD as PCP - Electrophysiology (Cardiology) Christina Coyer, MD as Consulting Physician (Urology) Alver Austin, [facility] (Inactive) as Pharmacist (Pharmacist) Dema Filler, MD as Consulting Physician (Ophthalmology)  Extended Emergency Contact Information Primary Emergency Contact: [facility] Address: [address]          [place], [place] [address] [place] Phone: [phone] Relation: Daughter Secondary Emergency Contact: Bluford Burkitt Address: [address]          [place], [place] [address] [place] Home Phone: [phone] Mobile Phone: [phone] Relation: Daughter  Code Status: Full Code  Goals of Care: Advanced Directive information    02/09/2024    1:54 PM  Advanced Directives  Does Patient Have a Medical Advance Directive? Yes  Type of Estate agent of [place];Living will  Does patient want to make changes to medical advance directive? No - Patient declined  Copy of Healthcare Power of Attorney in Chart? Yes - validated most recent copy scanned in chart (See row information)      Chief Complaint  Patient presents with   OTHER    New admission to ALF     HPI: Patient is a 89 y.o. male seen today for admission to  Past Medical History:  Diagnosis Date   Age-related macular degeneration, dry, both eyes    Allergic    24/7; 365 days/year; I'm allergic to pollens, dust, all southern grasses/trees, mold, mildue, cats, dogs (01/07/2017)   Anal fissure    Asthma    sees Dr. Elverna Hamman    Benign prostatic hypertrophy    (sees Dr. Verdel Gitelman   CAD (coronary artery disease)    a. s/p CABG 2007.  b. Cath 08/2016 - 4/4 patent grafts.   Carotid bruit    carotid u/s 10/10: 0.39% bilaterally   Chronic combined systolic and diastolic CHF (congestive heart failure) (HCC)    Complication of anesthesia 1980s   w/anal cyst OR, he gave me a saddle block then put a narcotic in spinal cord; had a severe reaction to that (01/07/2017)   Congestive heart failure (CHF) [facility])    ED (erectile dysfunction)    Family history of adverse reaction to anesthesia    daughter wakes up during OR (01/07/2017)   GERD (gastroesophageal reflux disease)    Gout    HTN (hypertension)    Hx of colonic polyps    (sees Dr. Elvin Hammer)   Hyperlipidemia    Hypothyroidism    Moderate to severe aortic stenosis    a. s/p TAVR 02/2017.   Myocardial infarction [facility]) ~ 2000   Obesity    Osteoarthritis    was in my knees, hands (01/07/2017 )   PAF (paroxysmal atrial fibrillation) (HCC)    a. documented post TAVR.   Precancerous skin lesion    (sees Dr. 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   29 mm Edwards Sapien 3 transcatheter heart valve placed via percutaneous right transfemoral approach   Past Surgical History:  Procedure Laterality Date   ANUS SURGERY     opened it back up cause it wouldn't  heal; wound up w/a fissure (01/07/2017)   BIV ICD INSERTION CRT-D N/A 05/02/2017   Procedure: BIV ICD INSERTION CRT-D;  Surgeon: Verona Goodwill, MD;  Location: [facility] INVASIVE CV LAB;  Service: Cardiovascular;  Laterality: N/A;   CARDIAC CATHETERIZATION  10/29/2005   CARDIAC CATHETERIZATION N/A 08/27/2016   Procedure: Right/Left Heart Cath and Coronary/Graft Angiography;  Surgeon: Odie Benne, MD;  Location: [facility] INVASIVE CV LAB;  Service: Cardiovascular;  Laterality: N/A;   CARDIOVERSION N/A 04/24/2021   Procedure: CARDIOVERSION;  Surgeon: Elmyra Haggard, MD;  Location: [facility] ENDOSCOPY;  Service: Cardiovascular;  Laterality: N/A;    CARDIOVERSION N/A 11/08/2021   Procedure: CARDIOVERSION;  Surgeon: Hazle Lites, MD;  Location: [facility] ENDOSCOPY;  Service: Cardiovascular;  Laterality: N/A;   CARDIOVERSION N/A 02/06/2022   Procedure: CARDIOVERSION;  Surgeon: Loyde Rule, MD;  Location: [facility] ENDOSCOPY;  Service: Cardiovascular;  Laterality: N/A;   CATARACT EXTRACTION W/ INTRAOCULAR LENS  IMPLANT, BILATERAL Bilateral    CATARACT EXTRACTION, BILATERAL  2012   COLONOSCOPY  06/30/2008   no repeats needed    COLONOSCOPY     had 3 or 4 in the past    CORONARY ARTERY BYPASS GRAFT  2007   CABG X4   CYST EXCISION PERINEAL  1980s   HAMMER TOE SURGERY Bilateral    ICD GENERATOR CHANGEOUT N/A 05/30/2023   Procedure: ICD GENERATOR CHANGEOUT;  Surgeon: Verona Goodwill, MD;  Location: [facility] INVASIVE CV LAB;  Service: Cardiovascular;  Laterality: N/A;   JOINT REPLACEMENT     KNEE ARTHROPLASTY  07/30/2011   Procedure: COMPUTER ASSISTED TOTAL KNEE ARTHROPLASTY;  Surgeon: Jasmine Mesi;  Location: [facility] OR;  Service: Orthopedics;  Laterality: Left;  left total knee arthroplasty   MASTECTOMY SUBCUTANEOUS Bilateral    MULTIPLE TOOTH EXTRACTIONS     ORIF FINGER / THUMB FRACTURE Right ~ 1980   repair of thumb injury   PROSTATE BIOPSY     REPLACEMENT TOTAL KNEE BILATERAL Bilateral 2012   TEE WITHOUT CARDIOVERSION N/A 03/04/2017   Procedure: TRANSESOPHAGEAL ECHOCARDIOGRAM (TEE);  Surgeon: Odie Benne, MD;  Location: [facility] OR;  Service: Open Heart Surgery;  Laterality: N/A;   TOTAL KNEE REVISION Right 05/18/2019   Procedure: RIGHT PATELLA REVISION/REMOVAL;  Surgeon: Jasmine Mesi, MD;  Location: [facility] OR;  Service: Orthopedics;  Laterality: Right;   TRANSCATHETER AORTIC VALVE REPLACEMENT, TRANSFEMORAL N/A 03/04/2017   Procedure: TRANSCATHETER AORTIC VALVE REPLACEMENT, TRANSFEMORAL;  Surgeon: Odie Benne, MD;  Location: [facility] OR;  Service: Open Heart Surgery;  Laterality: N/A;    reports that he quit smoking about 62 years  ago. His smoking use included cigarettes. He started smoking about 75 years ago. He has a 45.5 pack-year smoking history. He has never used smokeless tobacco. He reports that he does not currently use alcohol after a past usage of about 7.0 standard drinks of alcohol per week. He reports that he does not use drugs. Social History   Socioeconomic History   Marital status: Married    Spouse name: Not on file   Number of children: 3   Years of education: Not on file   Highest education level: Not on file  Occupational History   Occupation: Product/process development scientist    Comment: builds malls  Tobacco Use   Smoking status: Former    Current packs/day: 0.00    Average packs/day: 3.5 packs/day for 13.0 years (45.5 ttl pk-yrs)    Types: Cigarettes    Start date: 17    Quit date:  1963    Years since quitting: 62.4   Smokeless tobacco: Never   Tobacco comments:    Former smoker 04/19/21  Vaping Use   Vaping status: Never Used  Substance and Sexual Activity   Alcohol use: Not Currently    Alcohol/week: 7.0 standard drinks of alcohol    Types: 7 Cans of beer per week    Comment: 1 beer daily after 5pm (04/19/21)   Drug use: No   Sexual activity: Never  Other Topics Concern   Not on file  Social History Narrative   FH of CAD, Male 1st degree relative less than age 46.   Social Drivers of Corporate investment banker Strain: Low Risk  (03/31/2023)   Overall Financial Resource Strain (CARDIA)    Difficulty of Paying Living Expenses: Not very hard  Food Insecurity: No Food Insecurity (11/18/2023)   Hunger Vital Sign    Worried About Running Out of Food in the Last Year: Never true    Ran Out of Food in the Last Year: Never true  Transportation Needs: No Transportation Needs (11/18/2023)   PRAPARE - Administrator, Civil Service (Medical): No    Lack of Transportation (Non-Medical): No  Physical Activity: Sufficiently Active (03/31/2023)   Exercise Vital Sign    Days of Exercise per  Week: 7 days    Minutes of Exercise per Session: 30 min  Stress: No Stress Concern Present (03/31/2023)   [employer] of Occupational Health - Occupational Stress Questionnaire    Feeling of Stress : Not at all  Social Connections: Socially Integrated (11/18/2023)   Social Connection and Isolation Panel    Frequency of Communication with Friends and Family: Three times a week    Frequency of Social Gatherings with Friends and Family: Three times a week    Attends Religious Services: More than 4 times per year    Active Member of Clubs or Organizations: Yes    Attends Banker Meetings: More than 4 times per year    Marital Status: Married  Catering manager Violence: Not At Risk (11/17/2023)   Humiliation, Afraid, Rape, and Kick questionnaire    Fear of Current or Ex-Partner: No    Emotionally Abused: No    Physically Abused: No    Sexually Abused: No    Functional Status Survey:    Family History  Problem Relation Age of Onset   Heart attack Father 62   Allergic rhinitis Father    Asthma Father    Heart failure Mother 13   Uterine cancer Mother    Breast cancer Mother    Colon cancer Neg Hx    Esophageal cancer Neg Hx     Health Maintenance  Topic Date Due   FOOT EXAM  Never done   OPHTHALMOLOGY EXAM  01/07/2024   Medicare Annual Wellness (AWV)  03/30/2024   COVID-19 Vaccine (7 - 2024-25 season) 05/26/2025 (Originally 09/23/2023)   INFLUENZA VACCINE  03/26/2024   HEMOGLOBIN A1C  04/12/2024   DTaP/Tdap/Td (3 - Td or Tdap) 10/28/2030   Pneumococcal Vaccine: 50+ Years  Completed   Zoster Vaccines- Shingrix  Completed   HPV VACCINES  Aged Out   Meningococcal B Vaccine  Aged Out    Allergies  Allergen Reactions   Peanut-Containing Drug Products Anaphylaxis   Sulfonamide Derivatives Anaphylaxis   Amlodipine  Swelling and Other (See Comments)    Swelling in ankles   Eliquis  [Apixaban ] Other (See Comments)    Back/hip pain  Lisinopril  Cough   Xarelto   [Rivaroxaban ] Other (See Comments)    Back/hip pain    Allergies as of 02/09/2024       Reactions   Peanut-containing Drug Products Anaphylaxis   Sulfonamide Derivatives Anaphylaxis   Amlodipine  Swelling, Other (See Comments)   Swelling in ankles   Eliquis  [apixaban ] Other (See Comments)   Back/hip pain   Lisinopril  Cough   Xarelto  [rivaroxaban ] Other (See Comments)   Back/hip pain        Medication List        Accurate as of February 09, 2024  2:49 PM. If you have any questions, ask your nurse or doctor.          albuterol  108 (90 Base) MCG/ACT inhaler Commonly known as: VENTOLIN  HFA Inhale 2 puffs into the lungs every 4 (four) hours as needed for wheezing or shortness of breath.   allopurinol  100 MG tablet Commonly known as: ZYLOPRIM  Take 1 tablet (100 mg total) by mouth daily.   amiodarone  200 MG tablet Commonly known as: PACERONE  Take 1 tablet (200 mg total) by mouth daily.   Arnuity Ellipta 100 MCG/ACT Aepb Generic drug: Fluticasone  Furoate 1 puff.   aspirin  81 MG tablet Take 1 tablet (81 mg total) by mouth daily.   atorvastatin  20 MG tablet Commonly known as: LIPITOR TAKE ONE TABLET ONCE DAILY   Baclofen 5 MG Tabs Take 5 mg by mouth every 12 (twelve) hours as needed.   benzonatate  100 MG capsule Commonly known as: TESSALON  Take 100 mg by mouth 2 (two) times daily.   Biofreeze Cool The Pain 4 % Gel Generic drug: Menthol  (Topical Analgesic) Apply 4 % topically in the morning, at noon, in the evening, and at bedtime.   CAL MAG ZINC +D3 PO Take 2 tablets by mouth in the morning. 500 mg / 7 mg / 25 mcg   calcium  carbonate 1250 (500 Ca) MG chewable tablet Commonly known as: OS-CAL Chew 1 tablet by mouth daily.   CALCIUM -MAGNESIUM -ZINC-D3 PO Take by mouth daily.   cetirizine 10 MG tablet Commonly known as: ZYRTEC Take 10 mg by mouth 2 (two) times daily. As needed   colchicine  0.6 MG tablet Take 1 tablet (0.6 mg total) by mouth every 6 (six)  hours as needed (gout).   dabigatran  150 MG Caps capsule Commonly known as: Pradaxa  TAKE (1) CAPSULE TWICE DAILY.   Debrox 6.5 % OTIC solution Generic drug: carbamide peroxide Place 3 drops into both ears daily. Until 02/11/2024   Dexcom G6 Receiver Devi 1 Device by Does not apply route 3 (three) times daily. E11.69   Dexcom G6 Sensor Misc 1 Device by Does not apply route 3 (three) times daily. E11.69   Dexcom G6 Transmitter Misc 1 Device by Does not apply route 3 (three) times daily. E11.69   diclofenac  Sodium 1 % Gel Commonly known as: Voltaren  Apply 4 g topically 3 (three) times daily as needed (pain).   docusate sodium  100 MG capsule Commonly known as: COLACE Take 100 mg by mouth 2 (two) times daily.   Entresto  24-26 MG Generic drug: sacubitril-valsartan TAKE 1/2 TABLET TWICE DAILY   fluticasone  50 MCG/ACT nasal spray Commonly known as: FLONASE  Place 2 sprays into both nostrils daily.   insulin  aspart 100 UNIT/ML injection Commonly known as: novoLOG  Inject 4 Units into the skin 3 (three) times daily before meals. Inject as per sliding scale: if 141 - 180 = 4 units Inject per sliding scale; 181 - 220 = 6 units Inject  per sliding scale; 221 - 260 = 8 units Inject per sliding scale; 261 - 300 = 10 units Inject per sliding scale; 301 - 350 = 12 units Inject per sliding scale; 351 - 400= 14 units Inject per sliding scale; 401+ = 16 units Inject per sliding scale, subcutaneously three times a day   ipratropium 0.03 % nasal spray Commonly known as: ATROVENT  Place 2 sprays into both nostrils 2 (two) times daily.   isosorbide  mononitrate 30 MG 24 hr tablet Commonly known as: IMDUR  Take 1 tablet (30 mg total) by mouth daily.   ketotifen  0.025 % ophthalmic solution Commonly known as: ZADITOR  Place 1 drop into both eyes daily as needed (for itching).   levothyroxine  25 MCG tablet Commonly known as: SYNTHROID  Take 25 mcg by mouth daily.   levothyroxine  75 MCG  tablet Commonly known as: SYNTHROID  Take 75 mcg by mouth daily. Take 2 tablets once daily   levothyroxine  175 MCG tablet Commonly known as: SYNTHROID  Take 175 mcg by mouth daily.   magic mouthwash Soln Take 5 mLs by mouth as directed. Suspension contains equal amounts of Maalox Extra Strength, nystatin, and diphenhydramine . RX'ed by dentist   methocarbamol  500 MG tablet Commonly known as: ROBAXIN  Take 1 tablet (500 mg total) by mouth 4 (four) times daily as needed for muscle spasms.   metoCLOPramide  10 MG tablet Commonly known as: REGLAN  Take 10 mg by mouth daily.   metolazone  2.5 MG tablet Commonly known as: ZAROXOLYN  Take 1 tablet (2.5 mg total) by mouth once a week every Saturday   metoprolol  succinate 50 MG 24 hr tablet Commonly known as: TOPROL -XL Take 1 tablet (50 mg total) by mouth daily. Take with or immediately following a meal.   montelukast  10 MG tablet Commonly known as: SINGULAIR  TAKE ONE TABLET BY MOUTH ONCE DAILY IN THE EVENING   Multi-Vitamin Gummies Chew Chew 2 tablets by mouth See admin instructions. Chew 2 gummies by mouth in the morning   PreserVision AREDS 2 Caps Take 1 capsule by mouth in the morning and at bedtime.   nitrofurantoin 100 MG capsule Commonly known as: MACRODANTIN Take 100 mg by mouth 2 (two) times daily.   nystatin powder Commonly known as: MYCOSTATIN/NYSTOP Apply 1 Application topically 2 (two) times daily.   oxyCODONE  5 MG immediate release tablet Commonly known as: Roxicodone  Take 0.5 tablets (2.5 mg total) by mouth every 4 (four) hours as needed for severe pain (pain score 7-10).   pantoprazole  40 MG tablet Commonly known as: PROTONIX  Take 1 tablet (40 mg total) by mouth daily.   polyethylene glycol powder 17 GM/SCOOP powder Commonly known as: GLYCOLAX/MIRALAX Take by mouth every 12 (twelve) hours as needed for moderate constipation.   potassium chloride  SA 20 MEQ tablet Commonly known as: KLOR-CON  M Take 1 tablet (20  mEq total) by mouth 2 (two) times daily.   saccharomyces boulardii 250 MG capsule Commonly known as: FLORASTOR Take 250 mg by mouth once.   silver  sulfADIAZINE  1 % cream Commonly known as: Silvadene  Apply 1 Application topically 2 (two) times daily.   spironolactone  25 MG tablet Commonly known as: ALDACTONE  Take 1 tablet (25 mg total) by mouth daily.   Symbicort  80-4.5 MCG/ACT inhaler Generic drug: budesonide -formoterol  Inhale 2 puffs into the lungs.   tamsulosin 0.4 MG Caps capsule Commonly known as: FLOMAX Take 0.4 mg by mouth daily.   Torsemide  60 MG Tabs Take 60 mg by mouth daily.   torsemide  20 MG tablet Commonly known as: DEMADEX  Take 2  tablets (40 mg total) by mouth daily.   triamcinolone  cream 0.1 % Commonly known as: KENALOG  Apply 1 application  topically daily as needed for dry skin (affected areas).   valACYclovir  500 MG tablet Commonly known as: VALTREX  Take 500 mg by mouth 2 (two) times daily as needed (cold sores).   venlafaxine  XR 37.5 MG 24 hr capsule Commonly known as: Effexor  XR Take 1 capsule (37.5 mg total) by mouth daily with breakfast.        Review of Systems  Vitals:   02/09/24 1349  BP: 112/64  Pulse: 77  Resp: 20  Temp: (!) 97.5 F (36.4 C)  SpO2: 95%  Weight: 243 lb (110.2 kg)  Height: 5' 6.5 (1.689 m)   Body mass index is 38.63 kg/m. Physical Exam  Labs reviewed: Basic Metabolic Panel: Recent Labs    06/30/23 1616 07/01/23 0355 07/02/23 0415 11/25/23 0728 11/26/23 0417 11/27/23 0410 12/16/23 0000 12/23/23 1208 01/20/24 1702  NA  --  140   < > 134* 134* 134* 134* 132* 129*  K  --  4.1   < > 4.0 3.8 3.8 3.9 3.7 3.4*  CL  --  106   < > 98 97* 96* 92* 93* 91*  CO2  --  24   < > 27 27 29  32* 26 26  GLUCOSE  --  125*   < > 87 94 106*  --  92 131*  BUN  --  14   < > 41* 43* 46* 25* 20 32*  CREATININE  --  1.30*   < > 1.13 1.11 1.32* 1.3 1.58* 1.54*  CALCIUM   --  7.8*   < > 7.8* 7.8* 7.7* 7.8* 8.1* 8.3*  MG 2.7*   --    < > 2.7* 2.6* 2.5*  --   --   --   PHOS 2.3* 3.3  --   --   --   --   --   --   --    < > = values in this interval not displayed.   Liver Function Tests: Recent Labs    11/18/23 1918 11/19/23 1844 11/20/23 1317 12/16/23 0000 01/20/24 1702  AST 76*  --  93* 33 66*  ALT 45*  --  70* 33 31  ALKPHOS 147*  --  151* 160* 130*  BILITOT 0.8  --  0.8  --  0.7  PROT 6.7 7.3 6.5  --  6.9  ALBUMIN  2.1*  --  2.1* 2.4* 2.6*   No results for input(s): LIPASE, AMYLASE in the last 8760 hours. No results for input(s): AMMONIA in the last 8760 hours. CBC: Recent Labs    11/26/23 0417 11/27/23 0410 12/16/23 0000 01/20/24 1702  WBC 19.1* 20.4* 9.7 17.7*  NEUTROABS 16.4* 16.9* 5,645.00 12.9*  HGB 13.4 13.3 12.8* 14.0  HCT 43.0 41.9 41 44.2  MCV 86.2 84.5  --  87.9  PLT 345 352 211 345   Cardiac Enzymes: Recent Labs    11/22/23 0231  CKTOTAL 29*  CKMB 4.0   BNP: Invalid input(s): POCBNP Lab Results  Component Value Date   HGBA1C 6.8 (H) 10/14/2023   Lab Results  Component Value Date   TSH 0.169 (L) 11/18/2023   Lab Results  Component Value Date   VITAMINB12 503 10/14/2023   No results found for: FOLATE Lab Results  Component Value Date   IRON 151 06/30/2023   TIBC 400 06/30/2023   FERRITIN 28 06/30/2023    Imaging and Procedures  obtained prior to SNF admission: CT CHEST WO CONTRAST Result Date: 01/27/2024 CLINICAL DATA:  Blunt chest trauma, fall, right rib pain EXAM: CT CHEST WITHOUT CONTRAST TECHNIQUE: Multidetector CT imaging of the chest was performed following the standard protocol without IV contrast. RADIATION DOSE REDUCTION: This exam was performed according to the departmental dose-optimization program which includes automated exposure control, adjustment of the mA and/or kV according to patient size and/or use of iterative reconstruction technique. COMPARISON:  None Available. FINDINGS: Cardiovascular: Status post coronary artery bypass grafting and  transcatheter aortic valve replacement. Left subclavian 3 lead pacemaker in place with leads within the left ventricular venous outflow, right atrium and right ventricle. Mild global cardiomegaly. No pericardial effusion. Central pulmonary arteries are enlarged in keeping with changes of pulmonary arterial hypertension. Moderate atherosclerotic calcification within the thoracic aorta. Bulky calcification results in at least 50 percent stenosis of the proximal right subclavian artery though this is not optimally assessed on this noncontrast examination. Mediastinum/Nodes: No enlarged mediastinal or axillary lymph nodes. Thyroid  gland, trachea, and esophagus demonstrate no significant findings. Lungs/Pleura: Small bilateral pleural effusions are present with associated bibasilar atelectasis. Left pleural effusion has decreased and right pleural effusion has increased in size since prior exam. Previously noted extensive airspace infiltrate within right lung, in keeping with acute infection, has largely resolved though a small amount of residual reticulation and inflammatory infiltrate persists. No pneumothorax. No central obstructing lesion. Upper Abdomen: No acute abnormality. Musculoskeletal: Acute fractures of the right 9th and 10th ribs are seen laterally. Minimal displacement. Osseous structures are otherwise age-appropriate. No lytic or blastic bone lesion. IMPRESSION: 1. Acute minimally displaced fractures of the right 9th and 10th ribs laterally. No pneumothorax. 2. Small bilateral pleural effusions with associated bibasilar atelectasis. 3. Previously noted extensive airspace infiltrate within the right lung, in keeping with acute infection, has largely resolved though a small amount of residual reticulation and inflammatory infiltrate persists. 4. Status post coronary artery bypass grafting and transcatheter aortic valve replacement. 5. Enlargement of the central pulmonary arteries in keeping with changes of  pulmonary arterial hypertension. 6. Bulky calcification results in at least 50 percent stenosis of the proximal right subclavian artery though this is not optimally assessed on this noncontrast examination. 7. Aortic atherosclerosis. Aortic Atherosclerosis (ICD10-I70.0). Electronically Signed   By: Worthy Heads M.D.   On: 01/27/2024 22:49    Assessment/Plan There are no diagnoses linked to this encounter.   Family/ staff Communication:   Labs/tests ordered:

## 2024-02-10 ENCOUNTER — Ambulatory Visit: Admitting: Internal Medicine

## 2024-02-10 ENCOUNTER — Other Ambulatory Visit: Payer: Self-pay

## 2024-02-10 ENCOUNTER — Encounter: Payer: Self-pay | Admitting: Internal Medicine

## 2024-02-10 VITALS — BP 100/64 | HR 93 | Resp 16 | Ht 66.5 in | Wt 247.4 lb

## 2024-02-10 DIAGNOSIS — M6281 Muscle weakness (generalized): Secondary | ICD-10-CM | POA: Diagnosis not present

## 2024-02-10 DIAGNOSIS — R8271 Bacteriuria: Secondary | ICD-10-CM | POA: Diagnosis not present

## 2024-02-10 DIAGNOSIS — R296 Repeated falls: Secondary | ICD-10-CM | POA: Diagnosis not present

## 2024-02-10 DIAGNOSIS — M25562 Pain in left knee: Secondary | ICD-10-CM

## 2024-02-10 DIAGNOSIS — R2681 Unsteadiness on feet: Secondary | ICD-10-CM | POA: Diagnosis not present

## 2024-02-10 DIAGNOSIS — R2689 Other abnormalities of gait and mobility: Secondary | ICD-10-CM | POA: Diagnosis not present

## 2024-02-10 NOTE — Patient Instructions (Signed)
 We discuss the concept of lower urinary tract infection, assymptomatic bacteria presence in urine, versus true urinary tract infection  We reviewed the recent admission 10/2023 for heart failure and presence of assymptomatic bacteria in urine   Unless you have sign of UTI, lower urinary tract symptoms, flank pain, blood in urine, with fever, chill. If you have baseline minor lower urinary tract symptoms, an episode of uti would be much worse symptoms for a few days progressing to kidney infection (flank pain) with fever. Chronic minor symptoms do not suggest chronic uti.   There is no such thing as chronic uti

## 2024-02-10 NOTE — Progress Notes (Signed)
 [facility]  Reason for Consult:assymptomatic bacteriuria Referring Provider: Christina Coyer    Patient Active Problem List   Diagnosis Date Noted   UTI (urinary tract infection) 12/19/2023   Recurrent depression (HCC) 12/19/2023   Candidiasis 12/15/2023   Pleural effusion 11/20/2023   Asymptomatic bacteriuria 11/20/2023   Viral pneumonia 11/19/2023   Acute hypoxemic respiratory failure (HCC) 11/18/2023   Coronavirus infection 11/18/2023   Acute respiratory failure with hypoxia (HCC) 11/17/2023   Bronchitis 11/06/2023   Type 2 diabetes mellitus (HCC) 10/23/2023   Leukocytosis 10/16/2023   MDD (major depressive disorder) 10/09/2023   Permanent atrial fibrillation (HCC) 09/01/2023   Right ventricular dysfunction 07/05/2023   Biventricular cardiac pacemaker in situ 07/05/2023   Chronic kidney disease, stage 3a (HCC) 07/05/2023   Persistent atrial fibrillation (HCC) 07/05/2023   Long term (current) use of anticoagulants 07/05/2023   Hx of CABG 07/05/2023   Atherosclerosis of native coronary artery of native heart without angina pectoris 07/05/2023   Acute exacerbation of congestive heart failure (HCC) 06/30/2023   NSVT (nonsustained ventricular tachycardia) (HCC) 05/23/2022   Sinus node dysfunction (HCC) 05/23/2022   Frequent PVCs 05/23/2022   Acute on chronic combined systolic and diastolic heart failure (HCC) 03/12/2022   Biventricular implantable cardioverter-defibrillator (ICD) in situ 03/12/2022   Atypical atrial flutter (HCC) 01/30/2022   Paroxysmal atrial fibrillation (HCC)    Secondary hypercoagulable state (HCC) 04/19/2021   Raynaud's phenomenon without gangrene 07/11/2020   Vertigo 07/11/2020   Loosening of knee joint prosthesis (HCC) 05/18/2019   Cough 04/30/2019   Gastroesophageal reflux disease 04/30/2019   S/P ICD (internal cardiac defibrillator) procedure 05/03/2017   NICM (nonischemic cardiomyopathy) (HCC) 05/02/2017    History of transcatheter aortic valve replacement (TAVR) 03/04/2017   Severe aortic valve stenosis 03/04/2017   Hypokalemia due to loss of potassium with diuresis 01/10/2017   Aortic stenosis 01/10/2017   Chronic systolic CHF (congestive heart failure) (HCC) 01/10/2017   Chronic congestive heart failure (HCC) 01/07/2017   Hypothyroidism 12/23/2016   Prostate cancer (HCC)    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   Edema of both legs 09/01/2014   Asthma, intermittent 09/01/2014   Epiphora due to insufficient drainage 07/02/2011   Rosacea blepharoconjunctivitis 07/02/2011   Coronary artery disease involving native coronary artery of native heart without angina pectoris    GOUT 09/12/2009   CAD, AUTOLOGOUS BYPASS GRAFT 05/10/2009   MURMUR 05/08/2009   BRUIT 05/08/2009   MUSCLE CRAMPS 12/12/2008   KNEE SPRAIN 09/21/2008   Osteoarthritis 07/07/2008   BPH with urinary obstruction 02/01/2008   LOW BACK PAIN SYNDROME 02/01/2008   TESTOSTERONE DEFICIENCY 07/03/2007   Hyperlipidemia 07/03/2007   Essential hypertension 07/03/2007   MYOCARDIAL INFARCTION, HX OF 07/03/2007   Allergic rhinitis 07/03/2007   History of colonic polyps 07/03/2007   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      HPI: Justin [name] is a 89 y.o. male s/p cabg, chf, hx lbbb,  htn, hlp, dm2, as s/p tavr, pafib on pradaxa , hypothyroidism, prostate cancer gleason 7, bph with luts, referred by dr Marchelle Sessions from [facility] urology for recurrent cystitis  Reviewed chart sent 01/13/24 urology note Reviewed 11/17/2023 [place] note h&p chf admission. The family thinks he was in for pneumonia Patient found to have assymptomatic bacteriuria -- not treated, as appropriate 11/18/2023 ucx esbl ecoli (S nitrofurantoin, imipenem)  Patient has had a few courses of antibiotics since early march  Most recent antibiotics nitrofurantoin He received augmentin  mid  march prior to the admission  Patient never had lower urinary tract  symptoms during these episodes mentioned in march.  Patient is currently at his normal state of health  Review of Systems: ROS All other ros negative     Past Medical History:  Diagnosis Date   Age-related macular degeneration, dry, both eyes    Allergic    24/7; 365 days/year; I'm allergic to pollens, dust, all southern grasses/trees, mold, mildue, cats, dogs (01/07/2017)   Anal fissure    Asthma    sees Dr. Elverna Hamman    Benign prostatic hypertrophy    (sees Dr. Verdel Gitelman   CAD (coronary artery disease)    a. s/p CABG 2007. b. Cath 08/2016 - 4/4 patent grafts.   Carotid bruit    carotid u/s 10/10: 0.39% bilaterally   Chronic combined systolic and diastolic CHF (congestive heart failure) (HCC)    Complication of anesthesia 1980s   w/anal cyst OR, he gave me a saddle block then put a narcotic in spinal cord; had a severe reaction to that (01/07/2017)   Congestive heart failure (CHF) [facility])    ED (erectile dysfunction)    Family history of adverse reaction to anesthesia    daughter wakes up during OR (01/07/2017)   GERD (gastroesophageal reflux disease)    Gout    HTN (hypertension)    Hx of colonic polyps    (sees Dr. Elvin Hammer)   Hyperlipidemia    Hypothyroidism    Moderate to severe aortic stenosis    a. s/p TAVR 02/2017.   Myocardial infarction [facility]) ~ 2000   Obesity    Osteoarthritis    was in my knees, hands (01/07/2017 )   PAF (paroxysmal atrial fibrillation) (HCC)    a. documented post TAVR.   Precancerous skin lesion    (sees Dr. Fleurette Humbles)   Prostate cancer [facility]) dx'd ~ 2014   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   29 mm Edwards Sapien 3 transcatheter heart valve placed via percutaneous right transfemoral approach    Social History   Tobacco Use   Smoking status: Former    Current packs/day: 0.00    Average packs/day: 3.5 packs/day for 13.0 years (45.5 ttl pk-yrs)    Types: Cigarettes    Start date: 67     Quit date: 1963    Years since quitting: 62.5   Smokeless tobacco: Never   Tobacco comments:    Former smoker 04/19/21  Vaping Use   Vaping status: Never Used  Substance Use Topics   Alcohol use: Not Currently    Alcohol/week: 7.0 standard drinks of alcohol    Types: 7 Cans of beer per week    Comment: 1 beer daily after 5pm (04/19/21)   Drug use: No    Family History  Problem Relation Age of Onset   Heart attack Father 69   Allergic rhinitis Father    Asthma Father    Heart failure Mother 30   Uterine cancer Mother    Breast cancer Mother    Colon cancer Neg Hx    Esophageal cancer Neg Hx     Allergies  Allergen Reactions   Peanut-Containing Drug Products Anaphylaxis   Sulfonamide Derivatives Anaphylaxis   Amlodipine  Swelling and Other (See Comments)    Swelling in ankles   Eliquis  [Apixaban ] Other (See Comments)    Back/hip pain   Lisinopril  Cough   Xarelto  [Rivaroxaban ] Other (See Comments)    Back/hip pain  OBJECTIVE: Vitals:   02/10/24 0905  Resp: 16  Weight: 247 lb 6.4 oz (112.2 kg)  Height: 5' 6.5 (1.689 m)   Body mass index is 39.33 kg/m.   Physical Exam General/constitutional: no distress, pleasant HEENT: Normocephalic, PER, Conj Clear, EOMI, Oropharynx clear Neck supple CV: rrr no mrg Lungs: clear to auscultation, normal respiratory effort Abd: Soft, Nontender Ext: no edema Skin: No Rash Neuro: nonfocal MSK: no peripheral joint swelling/tenderness/warmth; back spines nontender   Lab: Lab Results  Component Value Date   WBC 17.7 (H) 01/20/2024   HGB 14.0 01/20/2024   HCT 44.2 01/20/2024   MCV 87.9 01/20/2024   PLT 345 01/20/2024   Last metabolic panel Lab Results  Component Value Date   GLUCOSE 131 (H) 01/20/2024   NA 129 (L) 01/20/2024   K 3.4 (L) 01/20/2024   CL 91 (L) 01/20/2024   CO2 26 01/20/2024   BUN 32 (H) 01/20/2024   CREATININE 1.54 (H) 01/20/2024   GFRNONAA 43 (L) 01/20/2024   CALCIUM  8.3 (L) 01/20/2024   PHOS  3.3 07/01/2023   PROT 6.9 01/20/2024   ALBUMIN  2.6 (L) 01/20/2024   LABGLOB 3.4 04/18/2023   AGRATIO 1.5 11/20/2022   BILITOT 0.7 01/20/2024   ALKPHOS 130 (H) 01/20/2024   AST 66 (H) 01/20/2024   ALT 31 01/20/2024   ANIONGAP 12 01/20/2024    Microbiology:  Serology:  Imaging:   Assessment/plan: Problem List Items Addressed This Visit     Asymptomatic bacteriuria - Primary      Assymptomatic bacteriuria  Discuss spectrum from uti to assymptomatic bacteriuria Discuss avoiding urine testing if no sign/symptoms of uti Discuss recent systematic review arguing against uti in isolated ams  Recent labs from outside chf Cr 1.23 Lft 44/33/140/0.8 Cbc 11.6/12.3/341  Hand out given for criteria of uti vs other non-uti conditions spectrum  No antibiotics indicated Nitrofurantoin unclear if will get in urine enough concentration   Will consider methenamine/vit c if true recurrent uti  Discuss with his daughter, wife, and patient  Follow up as needed    Follow-up: Return if symptoms worsen or fail to improve.  Jamesetta Mcbride, MD [facility] 02/10/2024, 9:15 AM

## 2024-02-11 DIAGNOSIS — R2681 Unsteadiness on feet: Secondary | ICD-10-CM | POA: Diagnosis not present

## 2024-02-11 DIAGNOSIS — R2689 Other abnormalities of gait and mobility: Secondary | ICD-10-CM | POA: Diagnosis not present

## 2024-02-11 DIAGNOSIS — M6281 Muscle weakness (generalized): Secondary | ICD-10-CM | POA: Diagnosis not present

## 2024-02-11 DIAGNOSIS — R296 Repeated falls: Secondary | ICD-10-CM | POA: Diagnosis not present

## 2024-02-11 NOTE — Progress Notes (Signed)
 This encounter was created in error - please disregard.

## 2024-02-12 ENCOUNTER — Telehealth (HOSPITAL_COMMUNITY): Payer: Self-pay | Admitting: Cardiology

## 2024-02-12 NOTE — Progress Notes (Signed)
 [facility] NOTE  Primary Care: Mast, Man X, NP Primary Cardiologist: Dr. Abel Hoe EP: Dr. Rodolfo Clan HF Cardiologist: Dr. Bruce Caper  CC: Heart Failure  HPI: Justin [name] is a 89 y.o. male with permanent atrial fibrillation, aortic stenosis s/p TAVR, medtronic BiV ICD in 2018 and after TAVR, CKD, CAD s/p CABG in 2007.  Has followed with Dr. Abel Hoe and Dr. Rodolfo Clan. S/p ICD generator changed 10/24. EF 40-45% at the time. Enrolled in iCM monitoring.  Admitted 3/25 for pneumonia and compressive atelectasis 2/2 COVID and flu infection c/b UTI . S/p thoracentesis 1L. EF 40-45% with mod reduced RV, normal AoV prosthetic. He was discharged to SNF.   Interval hx:  Today he returns for HF follow up with his daughter. Overall feeling fatigued. He walks on flat ground with his walker without dyspnea. Occasional dizziness with positional changes. Denies palpitations, abnormal bleeding, CP, edema, or PND/Orthopnea. Appetite ok. Taking all medications provided by facility. Lives at [facility], transitioning from SNF to ALF today! Has sleep study in progress. Typically wears compression hose.  SH: Lives in senior living facility with wife    Current Outpatient Medications  Medication Sig Dispense Refill   albuterol  (VENTOLIN  HFA) 108 (90 Base) MCG/ACT inhaler Inhale 2 puffs into the lungs every 4 (four) hours as needed for wheezing or shortness of breath. 8 g 5   allopurinol  (ZYLOPRIM ) 100 MG tablet Take 1 tablet (100 mg total) by mouth daily. 90 tablet 3   amiodarone  (PACERONE ) 200 MG tablet Take 1 tablet (200 mg total) by mouth daily. 90 tablet 3   aspirin  81 MG tablet Take 1 tablet (81 mg total) by mouth daily. 30 tablet 0   atorvastatin  (LIPITOR) 20 MG tablet TAKE ONE TABLET ONCE DAILY 90 tablet 2   Baclofen 5 MG TABS Take 5 mg by mouth every 12 (twelve) hours as needed.     benzonatate  (TESSALON ) 100 MG capsule Take 100 mg by mouth 2 (two) times daily. (Patient not taking:  Reported on 02/10/2024)     budesonide -formoterol  (SYMBICORT ) 80-4.5 MCG/ACT inhaler Inhale 2 puffs into the lungs. (Patient not taking: Reported on 02/10/2024)     calcium  carbonate (OS-CAL) 1250 (500 Ca) MG chewable tablet Chew 1 tablet by mouth daily.     Calcium -Magnesium -Zinc-Vit D3 (CALCIUM -MAGNESIUM -ZINC-D3 PO) Take by mouth daily.     carbamide peroxide (DEBROX) 6.5 % OTIC solution Place 3 drops into both ears daily. Until 02/11/2024     cetirizine (ZYRTEC) 10 MG tablet Take 10 mg by mouth 2 (two) times daily. As needed     colchicine  0.6 MG tablet Take 1 tablet (0.6 mg total) by mouth every 6 (six) hours as needed (gout). 60 tablet 5   Continuous Glucose Receiver (DEXCOM G6 RECEIVER) DEVI 1 Device by Does not apply route 3 (three) times daily. E11.69 (Patient not taking: Reported on 02/10/2024) 1 each 11   Continuous Glucose Sensor (DEXCOM G6 SENSOR) MISC 1 Device by Does not apply route 3 (three) times daily. E11.69 (Patient not taking: Reported on 02/10/2024) 3 each 11   Continuous Glucose Transmitter (DEXCOM G6 TRANSMITTER) MISC 1 Device by Does not apply route 3 (three) times daily. E11.69 (Patient not taking: Reported on 02/10/2024) 1 each 11   dabigatran  (PRADAXA ) 150 MG CAPS capsule TAKE (1) CAPSULE TWICE DAILY. 180 capsule 2   diclofenac  Sodium (VOLTAREN ) 1 % GEL Apply 4 g topically 3 (three) times daily as needed (pain). 50 g 0   docusate sodium  (COLACE) 100  MG capsule Take 100 mg by mouth 2 (two) times daily.     fluticasone  (FLONASE ) 50 MCG/ACT nasal spray Place 2 sprays into both nostrils daily.     Fluticasone  Furoate (ARNUITY ELLIPTA) 100 MCG/ACT AEPB 1 puff. (Patient not taking: Reported on 02/10/2024)     insulin  aspart (NOVOLOG ) 100 UNIT/ML injection Inject 4 Units into the skin 3 (three) times daily before meals. Inject as per sliding scale: if 141 - 180 = 4 units Inject per sliding scale; 181 - 220 = 6 units Inject per sliding scale; 221 - 260 = 8 units Inject per sliding scale;  261 - 300 = 10 units Inject per sliding scale; 301 - 350 = 12 units Inject per sliding scale; 351 - 400= 14 units Inject per sliding scale; 401+ = 16 units Inject per sliding scale, subcutaneously three times a day     ipratropium (ATROVENT ) 0.03 % nasal spray Place 2 sprays into both nostrils 2 (two) times daily.     isosorbide  mononitrate (IMDUR ) 30 MG 24 hr tablet Take 1 tablet (30 mg total) by mouth daily. 90 tablet 3   ketotifen  (ZADITOR ) 0.025 % ophthalmic solution Place 1 drop into both eyes daily as needed (for itching).     levothyroxine  (SYNTHROID ) 175 MCG tablet Take 175 mcg by mouth daily.     levothyroxine  (SYNTHROID ) 25 MCG tablet Take 25 mcg by mouth daily. (Patient not taking: Reported on 02/10/2024)     levothyroxine  (SYNTHROID ) 75 MCG tablet Take 75 mcg by mouth daily. Take 2 tablets once daily     magic mouthwash SOLN Take 5 mLs by mouth as directed. Suspension contains equal amounts of Maalox Extra Strength, nystatin, and diphenhydramine . RX'ed by dentist (Patient not taking: Reported on 02/10/2024)     Menthol , Topical Analgesic, (BIOFREEZE COOL THE PAIN) 4 % GEL Apply 4 % topically in the morning, at noon, in the evening, and at bedtime. (Patient not taking: Reported on 02/10/2024)     methocarbamol  (ROBAXIN ) 500 MG tablet Take 1 tablet (500 mg total) by mouth 4 (four) times daily as needed for muscle spasms. (Patient not taking: Reported on 02/10/2024) 50 tablet 0   metoCLOPramide  (REGLAN ) 10 MG tablet Take 10 mg by mouth daily.     metolazone  (ZAROXOLYN ) 2.5 MG tablet Take 1 tablet (2.5 mg total) by mouth once a week every Saturday 12 tablet 3   metoprolol  succinate (TOPROL -XL) 50 MG 24 hr tablet Take 1 tablet (50 mg total) by mouth daily. Take with or immediately following a meal.     montelukast  (SINGULAIR ) 10 MG tablet TAKE ONE TABLET BY MOUTH ONCE DAILY IN THE EVENING 90 tablet 3   Multiple Minerals-Vitamins (CAL MAG ZINC +D3 PO) Take 2 tablets by mouth in the morning. 500 mg /  7 mg / 25 mcg (Patient not taking: Reported on 02/10/2024)     Multiple Vitamins-Minerals (MULTI-VITAMIN GUMMIES) CHEW Chew 2 tablets by mouth See admin instructions. Chew 2 gummies by mouth in the morning     Multiple Vitamins-Minerals (PRESERVISION AREDS 2) CAPS Take 1 capsule by mouth in the morning and at bedtime.     nitrofurantoin (MACRODANTIN) 100 MG capsule Take 100 mg by mouth 2 (two) times daily. (Patient not taking: Reported on 02/10/2024)     nystatin (MYCOSTATIN/NYSTOP) powder Apply 1 Application topically 2 (two) times daily.     oxyCODONE  (ROXICODONE ) 5 MG immediate release tablet Take 0.5 tablets (2.5 mg total) by mouth every 4 (four) hours as needed for  severe pain (pain score 7-10). 30 tablet 0   pantoprazole  (PROTONIX ) 40 MG tablet Take 1 tablet (40 mg total) by mouth daily. 30 tablet 3   polyethylene glycol powder (GLYCOLAX/MIRALAX) 17 GM/SCOOP powder Take by mouth every 12 (twelve) hours as needed for moderate constipation.     potassium chloride  SA (KLOR-CON  M) 20 MEQ tablet Take 1 tablet (20 mEq total) by mouth 2 (two) times daily. (Patient not taking: Reported on 02/10/2024) 180 tablet 3   saccharomyces boulardii (FLORASTOR) 250 MG capsule Take 250 mg by mouth once.     sacubitril-valsartan (ENTRESTO ) 24-26 MG TAKE 1/2 TABLET TWICE DAILY     silver  sulfADIAZINE  (SILVADENE ) 1 % cream Apply 1 Application topically 2 (two) times daily. 50 g 5   spironolactone  (ALDACTONE ) 25 MG tablet Take 1 tablet (25 mg total) by mouth daily. 30 tablet 9   tamsulosin (FLOMAX) 0.4 MG CAPS capsule Take 0.4 mg by mouth daily.     torsemide  (DEMADEX ) 20 MG tablet Take 2 tablets (40 mg total) by mouth daily. (Patient not taking: Reported on 02/10/2024)     Torsemide  60 MG TABS Take 60 mg by mouth daily.     triamcinolone  cream (KENALOG ) 0.1 % Apply 1 application  topically daily as needed for dry skin (affected areas).     valACYclovir  (VALTREX ) 500 MG tablet Take 500 mg by mouth 2 (two) times daily as  needed (cold sores).     venlafaxine  XR (EFFEXOR  XR) 37.5 MG 24 hr capsule Take 1 capsule (37.5 mg total) by mouth daily with breakfast. 30 capsule 1   No current facility-administered medications for this visit.     Wt Readings from Last 3 Encounters:  02/10/24 112.2 kg (247 lb 6.4 oz)  02/09/24 110.2 kg (243 lb)  02/05/24 115.7 kg (255 lb)   There were no vitals taken for this visit.  PHYSICAL EXAM: There were no vitals filed for this visit. GENERAL: NAD Lungs- *** CARDIAC:  JVP: *** cm          Normal rate with regular rhythm. *** murmur.  Pulses ***. *** edema.  ABDOMEN: Soft, non-tender, non-distended.  EXTREMITIES: Warm and well perfused.  NEUROLOGIC: No obvious FND   DATA REVIEW  Device interrogation (personally reviewed): OptiVol up, thoracic impedence down, 100% AF, 1.9 hr/day activity, 67% VP, no VT  ECHO: 11/12/22: LVEF 45-50%, RV function low normal.  11/18/23: LVEF 40-45%, RV mod reduced, BAE  CATH: LHC/RHC: 08/2016:  There is moderate aortic valve stenosis. SVG graft was visualized by angiography and is normal in caliber. 1st Mrg lesion, 65 %stenosed. Ost LM to LM lesion, 100 %stenosed. SVG graft was visualized by angiography and is normal in caliber. LIMA graft was visualized by angiography and is normal in caliber. Prox RCA to Mid RCA lesion, 100 %stenosed. SVG graft was visualized by angiography and is normal in caliber and anatomically normal. Hemodynamic findings consistent with mild pulmonary hypertension.   1. 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  2. The Left main is occluded at the ostium.  3. The LAD fills from the patent IMA graft and the Diagonal fills from the patent vein graft 4. The Circumflex fills from the patent vein graft to the OM. There is a moderate stenosis in the proximal segment of the OM branch proximal to the insertion of the vein graft.  5. The distal RCA  fills from the patent vein graft.  6. Moderate AS 7. 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  NYHA class / AHA Stage: III, functional class confounded by debility. However, he is VP 67%  Volume status & Diuretics: Volume up on exam and OptiVol, continue torsemide  40 mg daily; + metolazone  2.5 mg/extra 40 KCL on Saturdays. Will have him take addition metolazone  2.5/extra 40 KCL x 1 today.  Vasodilators: Continue Entresto  12/13 mg bid. Beta-Blocker: Continue Toprol  XL 50 mg daily. MRA: Continue Spironolactone  25 mg daily. Cardiometabolic: Off SGLT2i with UTIs. Devices therapies & Valvulopathies: s/p CRT-D, interrogation as above. Advanced therapies: not a candidate - Labs today. - I will ask device RN to send transmission in 1 week to follow thoracic impedence.  2. Permanent Atrial fibrillation & frequent PVCs  - Follow with EP, enrolled in iCM. - Continue amio 200 mg daily. - Continue Pradaxa  150 mg bid. - 67% VP on device interrogation today, ? If PVC burden up. Due for EP follow up, will send office a message.  3. CAD s/p CABG  - CABG in 2007; LHC in 2018. - No chest pain. - Continue ASA. - Continue atorvastatin  20 mg daily.  4. Aortic stenosis s/p TAVR - CRT-D placed due to syncope and TAVR. - Interrogation as above.  5. Hypothyroidism - on synthroid   - Per PCP  6. Dyspnea - Likely multifactorial. - PFTs have been ordered - Diurese as above.  Keep follow up next month with Dr. Bruce Caper, as scheduled.   Alwin Baars, DO 02/12/24

## 2024-02-12 NOTE — Telephone Encounter (Signed)
 Called to confirm/remind patient of their appointment at the [facility] on 02/12/24.   Appointment:   [] Confirmed  [] Left mess   [] No answer/No voice mail  [] VM Full/unable to leave message  [] Phone not in service  Patient reminded to bring all medications and/or complete list.  Confirmed patient has transportation. Gave directions, instructed to utilize valet parking.

## 2024-02-13 ENCOUNTER — Encounter (HOSPITAL_COMMUNITY): Payer: Self-pay | Admitting: Cardiology

## 2024-02-13 ENCOUNTER — Ambulatory Visit (HOSPITAL_COMMUNITY)
Admission: RE | Admit: 2024-02-13 | Discharge: 2024-02-13 | Disposition: A | Discharge status: Home / Self Care | Source: Ambulatory Visit | Attending: Cardiology | Admitting: Cardiology

## 2024-02-13 VITALS — BP 104/60 | HR 107 | Wt 250.6 lb

## 2024-02-13 DIAGNOSIS — E8770 Fluid overload, unspecified: Secondary | ICD-10-CM | POA: Diagnosis not present

## 2024-02-13 DIAGNOSIS — I350 Nonrheumatic aortic (valve) stenosis: Secondary | ICD-10-CM | POA: Diagnosis not present

## 2024-02-13 DIAGNOSIS — Z951 Presence of aortocoronary bypass graft: Secondary | ICD-10-CM | POA: Diagnosis not present

## 2024-02-13 DIAGNOSIS — R296 Repeated falls: Secondary | ICD-10-CM | POA: Diagnosis not present

## 2024-02-13 DIAGNOSIS — R2681 Unsteadiness on feet: Secondary | ICD-10-CM | POA: Diagnosis not present

## 2024-02-13 DIAGNOSIS — Z79899 Other long term (current) drug therapy: Secondary | ICD-10-CM | POA: Insufficient documentation

## 2024-02-13 DIAGNOSIS — Z952 Presence of prosthetic heart valve: Secondary | ICD-10-CM | POA: Diagnosis not present

## 2024-02-13 DIAGNOSIS — I5042 Chronic combined systolic (congestive) and diastolic (congestive) heart failure: Secondary | ICD-10-CM

## 2024-02-13 DIAGNOSIS — I4821 Permanent atrial fibrillation: Secondary | ICD-10-CM | POA: Diagnosis not present

## 2024-02-13 DIAGNOSIS — Z7902 Long term (current) use of antithrombotics/antiplatelets: Secondary | ICD-10-CM | POA: Diagnosis not present

## 2024-02-13 DIAGNOSIS — Z95810 Presence of automatic (implantable) cardiac defibrillator: Secondary | ICD-10-CM | POA: Diagnosis not present

## 2024-02-13 DIAGNOSIS — I493 Ventricular premature depolarization: Secondary | ICD-10-CM | POA: Insufficient documentation

## 2024-02-13 DIAGNOSIS — Z794 Long term (current) use of insulin: Secondary | ICD-10-CM | POA: Diagnosis not present

## 2024-02-13 DIAGNOSIS — I509 Heart failure, unspecified: Secondary | ICD-10-CM | POA: Insufficient documentation

## 2024-02-13 DIAGNOSIS — E039 Hypothyroidism, unspecified: Secondary | ICD-10-CM | POA: Diagnosis not present

## 2024-02-13 DIAGNOSIS — I2510 Atherosclerotic heart disease of native coronary artery without angina pectoris: Secondary | ICD-10-CM | POA: Diagnosis not present

## 2024-02-13 DIAGNOSIS — Z87440 Personal history of urinary (tract) infections: Secondary | ICD-10-CM | POA: Diagnosis not present

## 2024-02-13 DIAGNOSIS — I5020 Unspecified systolic (congestive) heart failure: Secondary | ICD-10-CM | POA: Insufficient documentation

## 2024-02-13 DIAGNOSIS — N189 Chronic kidney disease, unspecified: Secondary | ICD-10-CM | POA: Diagnosis not present

## 2024-02-13 DIAGNOSIS — R2689 Other abnormalities of gait and mobility: Secondary | ICD-10-CM | POA: Diagnosis not present

## 2024-02-13 DIAGNOSIS — Z79890 Hormone replacement therapy (postmenopausal): Secondary | ICD-10-CM | POA: Insufficient documentation

## 2024-02-13 DIAGNOSIS — Z7901 Long term (current) use of anticoagulants: Secondary | ICD-10-CM | POA: Insufficient documentation

## 2024-02-13 DIAGNOSIS — M6281 Muscle weakness (generalized): Secondary | ICD-10-CM | POA: Diagnosis not present

## 2024-02-13 MED ORDER — TORSEMIDE 20 MG PO TABS: 80.0000 mg | ORAL_TABLET | Freq: Every day | ORAL | 3 refills | Status: DC

## 2024-02-13 NOTE — Patient Instructions (Signed)
 Medication Changes:  TAKE TORSEMIDE  80MG  TWICE DAILY FOR 4 DAYS THEN RETURN TO 80MG  ONCE DAILY THEREAFTER   Lab Work:  PLEASE HAVE LABS DRAWN IN 1 WEEKS   Follow-Up in: 2 WEEKS AS SCHEDULED   At the [facility], you and your health needs are our priority. We have a designated team specialized in the treatment of Heart Failure. This Care Team includes your primary Heart Failure Specialized Cardiologist (physician), Advanced Practice Providers (APPs- Physician Assistants and Nurse Practitioners), and Pharmacist who all work together to provide you with the care you need, when you need it.   You may see any of the following providers on your designated Care Team at your next follow up:  Dr. Jules Oar Dr. Peder Bourdon Dr. Alwin Baars Dr. Judyth Nunnery Nieves Bars, NP Ruddy Corral, [place] [facility] [place], [place] Dennise Fitz, NP [place] Lee, NP Luster Salters, PharmD   Please be sure to bring in all your medications bottles to every appointment.   Need to Contact Us :  If you have any questions or concerns before your next appointment please send us  a message through [place] or call our office at [phone].    TO LEAVE A MESSAGE FOR THE NURSE SELECT OPTION 2, PLEASE LEAVE A MESSAGE INCLUDING: YOUR NAME DATE OF BIRTH CALL BACK NUMBER REASON FOR CALL**this is important as we prioritize the call backs  YOU WILL RECEIVE A CALL BACK THE SAME DAY AS LONG AS YOU CALL BEFORE 4:00 PM

## 2024-02-14 DIAGNOSIS — R2689 Other abnormalities of gait and mobility: Secondary | ICD-10-CM | POA: Diagnosis not present

## 2024-02-14 DIAGNOSIS — R296 Repeated falls: Secondary | ICD-10-CM | POA: Diagnosis not present

## 2024-02-14 DIAGNOSIS — R2681 Unsteadiness on feet: Secondary | ICD-10-CM | POA: Diagnosis not present

## 2024-02-14 DIAGNOSIS — M6281 Muscle weakness (generalized): Secondary | ICD-10-CM | POA: Diagnosis not present

## 2024-02-16 ENCOUNTER — Encounter (HOSPITAL_COMMUNITY)

## 2024-02-16 ENCOUNTER — Non-Acute Institutional Stay: Admitting: Nurse Practitioner

## 2024-02-16 ENCOUNTER — Encounter: Payer: Self-pay | Admitting: Nurse Practitioner

## 2024-02-16 DIAGNOSIS — R296 Repeated falls: Secondary | ICD-10-CM | POA: Diagnosis not present

## 2024-02-16 DIAGNOSIS — E1169 Type 2 diabetes mellitus with other specified complication: Secondary | ICD-10-CM

## 2024-02-16 DIAGNOSIS — N1831 Chronic kidney disease, stage 3a: Secondary | ICD-10-CM

## 2024-02-16 DIAGNOSIS — I4819 Other persistent atrial fibrillation: Secondary | ICD-10-CM

## 2024-02-16 DIAGNOSIS — R2681 Unsteadiness on feet: Secondary | ICD-10-CM | POA: Diagnosis not present

## 2024-02-16 DIAGNOSIS — Z794 Long term (current) use of insulin: Secondary | ICD-10-CM

## 2024-02-16 DIAGNOSIS — N390 Urinary tract infection, site not specified: Secondary | ICD-10-CM | POA: Diagnosis not present

## 2024-02-16 DIAGNOSIS — I509 Heart failure, unspecified: Secondary | ICD-10-CM

## 2024-02-16 DIAGNOSIS — I10 Essential (primary) hypertension: Secondary | ICD-10-CM

## 2024-02-16 DIAGNOSIS — E039 Hypothyroidism, unspecified: Secondary | ICD-10-CM | POA: Diagnosis not present

## 2024-02-16 DIAGNOSIS — M150 Primary generalized (osteo)arthritis: Secondary | ICD-10-CM

## 2024-02-16 DIAGNOSIS — D72829 Elevated white blood cell count, unspecified: Secondary | ICD-10-CM | POA: Diagnosis not present

## 2024-02-16 DIAGNOSIS — I2510 Atherosclerotic heart disease of native coronary artery without angina pectoris: Secondary | ICD-10-CM | POA: Diagnosis not present

## 2024-02-16 DIAGNOSIS — M1A0490 Idiopathic chronic gout, unspecified hand, without tophus (tophi): Secondary | ICD-10-CM

## 2024-02-16 DIAGNOSIS — K219 Gastro-esophageal reflux disease without esophagitis: Secondary | ICD-10-CM

## 2024-02-16 DIAGNOSIS — E871 Hypo-osmolality and hyponatremia: Secondary | ICD-10-CM | POA: Insufficient documentation

## 2024-02-16 DIAGNOSIS — M6281 Muscle weakness (generalized): Secondary | ICD-10-CM | POA: Diagnosis not present

## 2024-02-16 DIAGNOSIS — R2689 Other abnormalities of gait and mobility: Secondary | ICD-10-CM | POA: Diagnosis not present

## 2024-02-16 DIAGNOSIS — E785 Hyperlipidemia, unspecified: Secondary | ICD-10-CM

## 2024-02-16 DIAGNOSIS — F339 Major depressive disorder, recurrent, unspecified: Secondary | ICD-10-CM

## 2024-02-16 NOTE — Assessment & Plan Note (Signed)
 no flare up over a year, R+L wrist, takes Allopurinol , colchicine ,  uric acid 7.6 10/10/22

## 2024-02-16 NOTE — Progress Notes (Signed)
 Location:   [facility]  Nursing Home Room Number: 913-A Place of Service:  ALF [phone]) Provider:  Kelcy Baeten, NP  PCP: Ashleigh Arya X, NP  Patient Care Team: Karmelo Bass X, NP as PCP - General (Internal Medicine) [name], MD as PCP - Cardiology (Cardiology) [name], MD as PCP - Electrophysiology (Cardiology) Nieves Cough, MD as Consulting Physician (Urology) Liane Sharyne MATSU, [facility] (Inactive) as Pharmacist (Pharmacist) Leslee Reusing, MD as Consulting Physician (Ophthalmology)  Extended Emergency Contact Information Primary Emergency Contact: [facility] Address: [address]          [place], [place] [address] [place] Phone: [phone] Relation: Daughter Secondary Emergency Contact: Billie Risen Address: [address]          [place], [place] [address] [place] Home Phone: [phone] Mobile Phone: [phone] Relation: Daughter  Code Status:  FULL CODE Goals of care: Advanced Directive information    02/16/2024   11:59 AM  Advanced Directives  Does Patient Have a Medical Advance Directive? Yes  Type of Advance Directive Living will;Healthcare Power of Attorney  Does patient want to make changes to medical advance directive? No - Patient declined  Copy of Healthcare Power of Attorney in Chart? Yes - validated most recent copy scanned in chart (See row information)     Chief Complaint  Patient presents with   Medical Management of Chronic Issues    Routine Visit. Discuss the need for Eye exam, and Foot exam.    HPI:  Pt is a 89 y.o. male seen today for medical management of chronic diseases.    OA, s/p left replacement, feels weak and clicks sometimes, pending MRI, ambulates with walker, risk of falling. [place] when outing and resulted left arm/back of hand multiple skin tear.  `  UTI, treated with Fosfomycin by Urology, 01/16/24 urology Fosfomycin 3gm po q48hr x 2 wks.                Hospitalized 11/17/23-11/27/23 for hypoxic respiratory failure COVID/PNA and acute HRmrEF, left pleural effusion-s/p thoracentesis with 1000cc of bloody fluid removed exudative by LDH,  improved SOB, Orthopnea, and edema BLE, treated with Prednisone , bronchodilators, antibiotics, and diuretics. Underwent evaluation of pulmonology, cardiology.              12/15/23 CXR mild CHF, no gross focal lund consolidation, pleural effusion, pneumothorax             Leukocytosis, chronic, wbc 17.7, neutrophils 73% 01/20/24             Gout, no flare up over a year, R+L wrist, takes Allopurinol , colchicine ,  uric acid 7.6 10/10/22             Afib, s/p pace maker, defibrillator , on Amiodarone , Pradaxa , Metoprolol , followed by cardiology.              HLD on ASA, Atorvastatin , LDL 55 10/14/23             CAD, Hx of CABF, TAVR, no chest pain, taking Isosorbide              CHF, mild edema BLE, RLE>LLE, off Farxiga  due to recurrent UTI, on Entresto , Metoprolol , Metolazone , Spironolactone , Torsemide , BNP 824 01/16/24  Hyponatremia, Na 129, K 3.4 01/20/24  CKD Bun/creat 32/1.54 01/20/24             Hypothyroidism, on Levothyroxine , TSH 3.27 10/14/23             HTN, blood  pressure is controlled, on Entresto , Metoprolol , diuretics.              GERD, stable, on Omeprazole , Hx of Reglan  use, Hgb 14 01/20/24             Depression, better with Effexor , failed Zoloft , feels sleeps too much and depressed.               T2DM, Hgb A1c 6.8 10/14/23, on insulin          Past Medical History:  Diagnosis Date   Age-related macular degeneration, dry, both eyes    Allergic    24/7; 365 days/year; I'm allergic to pollens, dust, all southern grasses/trees, mold, mildue, cats, dogs (01/07/2017)   Anal fissure    Asthma    sees Dr. Alaine    Benign prostatic hypertrophy    (sees Dr. Colan   CAD (coronary artery disease)    a. s/p CABG 2007. b. Cath 08/2016 - 4/4 patent grafts.   Carotid bruit    carotid u/s 10/10: 0.39%  bilaterally   Chronic combined systolic and diastolic CHF (congestive heart failure) (HCC)    Complication of anesthesia 1980s   w/anal cyst OR, he gave me a saddle block then put a narcotic in spinal cord; had a severe reaction to that (01/07/2017)   Congestive heart failure (CHF) [facility])    ED (erectile dysfunction)    Family history of adverse reaction to anesthesia    daughter wakes up during OR (01/07/2017)   GERD (gastroesophageal reflux disease)    Gout    HTN (hypertension)    Hx of colonic polyps    (sees Dr. Abran)   Hyperlipidemia    Hypothyroidism    Moderate to severe aortic stenosis    a. s/p TAVR 02/2017.   Myocardial infarction [facility]) ~ 2000   Obesity    Osteoarthritis    was in my knees, hands (01/07/2017 )   PAF (paroxysmal atrial fibrillation) (HCC)    a. documented post TAVR.   Precancerous skin lesion    (sees Dr. 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   29 mm Edwards Sapien 3 transcatheter heart valve placed via percutaneous right transfemoral approach   Past Surgical History:  Procedure Laterality Date   ANUS SURGERY     opened it back up cause it wouldn't heal; wound up w/a fissure (01/07/2017)   BIV ICD INSERTION CRT-D N/A 05/02/2017   Procedure: BIV ICD INSERTION CRT-D;  Surgeon: [name], MD;  Location: [facility] INVASIVE CV LAB;  Service: Cardiovascular;  Laterality: N/A;   CARDIAC CATHETERIZATION  10/29/2005   CARDIAC CATHETERIZATION N/A 08/27/2016   Procedure: Right/Left Heart Cath and Coronary/Graft Angiography;  Surgeon: Lonni JONETTA Cash, MD;  Location: [facility] INVASIVE CV LAB;  Service: Cardiovascular;  Laterality: N/A;   CARDIOVERSION N/A 04/24/2021   Procedure: CARDIOVERSION;  Surgeon: Okey Vina GAILS, MD;  Location: [facility] ENDOSCOPY;  Service: Cardiovascular;  Laterality: N/A;   CARDIOVERSION N/A 11/08/2021   Procedure: CARDIOVERSION;  Surgeon: Mona Vinie BROCKS,  MD;  Location: [facility] ENDOSCOPY;  Service: Cardiovascular;  Laterality: N/A;   CARDIOVERSION N/A 02/06/2022   Procedure: CARDIOVERSION;  Surgeon: Delford Maude BROCKS, MD;  Location: [facility] ENDOSCOPY;  Service: Cardiovascular;  Laterality: N/A;   CATARACT EXTRACTION W/ INTRAOCULAR LENS  IMPLANT, BILATERAL Bilateral    CATARACT EXTRACTION, BILATERAL  2012   COLONOSCOPY  06/30/2008   no  repeats needed    COLONOSCOPY     had 3 or 4 in the past    CORONARY ARTERY BYPASS GRAFT  2007   CABG X4   CYST EXCISION PERINEAL  1980s   HAMMER TOE SURGERY Bilateral    ICD GENERATOR CHANGEOUT N/A 05/30/2023   Procedure: ICD GENERATOR CHANGEOUT;  Surgeon: [name], MD;  Location: [facility] INVASIVE CV LAB;  Service: Cardiovascular;  Laterality: N/A;   JOINT REPLACEMENT     KNEE ARTHROPLASTY  07/30/2011   Procedure: COMPUTER ASSISTED TOTAL KNEE ARTHROPLASTY;  Surgeon: Cordella Glendia Hutchinson;  Location: [facility] OR;  Service: Orthopedics;  Laterality: Left;  left total knee arthroplasty   MASTECTOMY SUBCUTANEOUS Bilateral    MULTIPLE TOOTH EXTRACTIONS     ORIF FINGER / THUMB FRACTURE Right ~ 1980   repair of thumb injury   PROSTATE BIOPSY     REPLACEMENT TOTAL KNEE BILATERAL Bilateral 2012   TEE WITHOUT CARDIOVERSION N/A 03/04/2017   Procedure: TRANSESOPHAGEAL ECHOCARDIOGRAM (TEE);  Surgeon: [name], MD;  Location: [facility] OR;  Service: Open Heart Surgery;  Laterality: N/A;   TOTAL KNEE REVISION Right 05/18/2019   Procedure: RIGHT PATELLA REVISION/REMOVAL;  Surgeon: Hutchinson Cordella Glendia, MD;  Location: [facility] OR;  Service: Orthopedics;  Laterality: Right;   TRANSCATHETER AORTIC VALVE REPLACEMENT, TRANSFEMORAL N/A 03/04/2017   Procedure: TRANSCATHETER AORTIC VALVE REPLACEMENT, TRANSFEMORAL;  Surgeon: [name], MD;  Location: [facility] OR;  Service: Open Heart Surgery;  Laterality: N/A;    Allergies  Allergen Reactions   Peanut-Containing Drug Products Anaphylaxis   Sulfonamide Derivatives Anaphylaxis   Amlodipine   Swelling and Other (See Comments)    Swelling in ankles   Eliquis  [Apixaban ] Other (See Comments)    Back/hip pain   Lisinopril  Cough   Xarelto  [Rivaroxaban ] Other (See Comments)    Back/hip pain    Allergies as of 02/16/2024       Reactions   Peanut-containing Drug Products Anaphylaxis   Sulfonamide Derivatives Anaphylaxis   Amlodipine  Swelling, Other (See Comments)   Swelling in ankles   Eliquis  [apixaban ] Other (See Comments)   Back/hip pain   Lisinopril  Cough   Xarelto  [rivaroxaban ] Other (See Comments)   Back/hip pain        Medication List        Accurate as of February 16, 2024  2:53 PM. If you have any questions, ask your nurse or doctor.          STOP taking these medications    Arnuity Ellipta 100 MCG/ACT Aepb Generic drug: Fluticasone  Furoate Stopped by: Thana Ramp X Fabrizio Filip   CAL MAG ZINC +D3 PO Stopped by: Raymundo Rout X Kaiven Vester   methocarbamol  500 MG tablet Commonly known as: ROBAXIN  Stopped by: Allizon Woznick X Davan Nawabi   Symbicort  80-4.5 MCG/ACT inhaler Generic drug: budesonide -formoterol  Stopped by: Danity Schmelzer X Kooper Godshall       TAKE these medications    albuterol  108 (90 Base) MCG/ACT inhaler Commonly known as: VENTOLIN  HFA Inhale 2 puffs into the lungs every 4 (four) hours as needed for wheezing or shortness of breath.   allopurinol  100 MG tablet Commonly known as: ZYLOPRIM  Take 1 tablet (100 mg total) by mouth daily.   amiodarone  200 MG tablet Commonly known as: PACERONE  Take 1 tablet (200 mg total) by mouth daily.   aspirin  81 MG tablet Take 1 tablet (81 mg total) by mouth daily.   atorvastatin  20 MG tablet Commonly known as: LIPITOR TAKE ONE TABLET ONCE DAILY   Baclofen 5 MG Tabs Take 5  mg by mouth every 12 (twelve) hours as needed.   Biofreeze Cool The Pain 4 % Gel Generic drug: Menthol  (Topical Analgesic) Apply 4 % topically in the morning, at noon, in the evening, and at bedtime.   calcium  carbonate 1250 (500 Ca) MG chewable tablet Commonly known as: OS-CAL Chew  1 tablet by mouth daily.   CALCIUM -MAGNESIUM -ZINC-D3 PO Take 2 tablets by mouth daily.   cetirizine 10 MG tablet Commonly known as: ZYRTEC Take 10 mg by mouth 2 (two) times daily. As needed   colchicine  0.6 MG tablet Take 1 tablet (0.6 mg total) by mouth every 6 (six) hours as needed (gout).   dabigatran  150 MG Caps capsule Commonly known as: Pradaxa  TAKE (1) CAPSULE TWICE DAILY.   Debrox 6.5 % OTIC solution Generic drug: carbamide peroxide Place 3 drops into both ears daily. Until 02/11/2024   diclofenac  Sodium 1 % Gel Commonly known as: Voltaren  Apply 4 g topically 3 (three) times daily as needed (pain).   docusate sodium  100 MG capsule Commonly known as: COLACE Take 100 mg by mouth 2 (two) times daily.   Entresto  24-26 MG Generic drug: sacubitril-valsartan Take 0.5 tablets by mouth 2 (two) times daily. What changed: Another medication with the same name was removed. Continue taking this medication, and follow the directions you see here. Changed by: Yalena Colon X Keionte Swicegood   fluticasone  50 MCG/ACT nasal spray Commonly known as: FLONASE  Place 2 sprays into both nostrils daily.   insulin  aspart 100 UNIT/ML injection Commonly known as: novoLOG  Inject 4-16 Units into the skin as directed. Inject as per sliding scale: if 141 - 180 = 4 units Inject per sliding scale; 181 - 220 = 6 units Inject per sliding scale; 221 - 260 = 8 units Inject per sliding scale; 261 - 300 = 10 units Inject per sliding scale; 301 - 350 = 12 units Inject per sliding scale; 351 - 400= 14 units Inject per sliding scale; 401+ = 16 units Inject per sliding scale, subcutaneously three times a day   ipratropium 0.03 % nasal spray Commonly known as: ATROVENT  Place 2 sprays into both nostrils 2 (two) times daily.   isosorbide  mononitrate 30 MG 24 hr tablet Commonly known as: IMDUR  Take 1 tablet (30 mg total) by mouth daily.   ketotifen  0.025 % ophthalmic solution Commonly known as: ZADITOR  Place 1 drop into both  eyes daily as needed (for itching).   levothyroxine  175 MCG tablet Commonly known as: SYNTHROID  Take 175 mcg by mouth daily.   metoCLOPramide  10 MG tablet Commonly known as: REGLAN  Take 10 mg by mouth 2 (two) times daily.   metolazone  2.5 MG tablet Commonly known as: ZAROXOLYN  Take 1 tablet (2.5 mg total) by mouth once a week every Saturday   metoprolol  succinate 50 MG 24 hr tablet Commonly known as: TOPROL -XL Take 1 tablet (50 mg total) by mouth daily. Take with or immediately following a meal.   montelukast  10 MG tablet Commonly known as: SINGULAIR  TAKE ONE TABLET BY MOUTH ONCE DAILY IN THE EVENING   Multi-Vitamin Gummies Chew Chew 2 Pieces by mouth daily.   PreserVision AREDS 2 Caps Take 2 capsules by mouth in the morning and at bedtime.   nystatin powder Commonly known as: MYCOSTATIN/NYSTOP Apply 1 Application topically 2 (two) times daily.   oxyCODONE  5 MG immediate release tablet Commonly known as: Roxicodone  Take 0.5 tablets (2.5 mg total) by mouth every 4 (four) hours as needed for severe pain (pain score 7-10).   pantoprazole  40  MG tablet Commonly known as: PROTONIX  Take 40 mg by mouth 2 (two) times daily. What changed: Another medication with the same name was removed. Continue taking this medication, and follow the directions you see here. Changed by: Aleks Nawrot X Geniya Fulgham   polyethylene glycol powder 17 GM/SCOOP powder Commonly known as: GLYCOLAX/MIRALAX Take 17 g by mouth daily as needed for moderate constipation.   POTASSIUM CHLORIDE  ER PO Take 60 mEq by mouth 2 (two) times daily.   potassium chloride  SA 20 MEQ tablet Commonly known as: KLOR-CON  M Take 1 tablet (20 mEq total) by mouth 2 (two) times daily.   PROBIOTIC PO Take 1 capsule by mouth daily.   spironolactone  25 MG tablet Commonly known as: ALDACTONE  Take 1 tablet (25 mg total) by mouth daily.   tamsulosin 0.4 MG Caps capsule Commonly known as: FLOMAX Take 0.4 mg by mouth daily.   Torsemide   40 MG Tabs Take 80 mg by mouth daily.   Torsemide  40 MG Tabs Take 80 mg by mouth 2 (two) times daily.   torsemide  20 MG tablet Commonly known as: DEMADEX  Take 4 tablets (80 mg total) by mouth daily. Take 80mg  twice daily for 4 days only and then return to 80mg  once daily   triamcinolone  cream 0.1 % Commonly known as: KENALOG  Apply 1 Application topically every 12 (twelve) hours as needed.   triamcinolone  cream 0.1 % Commonly known as: KENALOG  Apply 1 application  topically daily as needed for dry skin (affected areas).   valACYclovir  500 MG tablet Commonly known as: VALTREX  Take 500 mg by mouth every 12 (twelve) hours as needed (cold sores).   venlafaxine  XR 37.5 MG 24 hr capsule Commonly known as: Effexor  XR Take 1 capsule (37.5 mg total) by mouth daily with breakfast.        Review of Systems  Constitutional:  Negative for appetite change, fatigue and fever.  HENT:  Negative for congestion and trouble swallowing.   Eyes:  Negative for visual disturbance.  Respiratory:  Negative for cough, shortness of breath and wheezing.   Cardiovascular:  Positive for leg swelling. Negative for chest pain and palpitations.  Gastrointestinal:  Negative for abdominal pain and constipation.  Genitourinary:  Positive for frequency. Negative for dysuria and urgency.       Nocturnal urination about every 2-3 hours.   Musculoskeletal:  Positive for arthralgias and gait problem.       S/p L knee replacement, feels weak and clicks   Skin:  Positive for wound.       Multiple skin tears left forearm, back of hand  Neurological:  Negative for dizziness, speech difficulty and weakness.  Psychiatric/Behavioral:  Negative for confusion, dysphoric mood and sleep disturbance. The patient is not nervous/anxious.     Immunization History  Administered Date(s) Administered   Fluad Quad(high Dose 65+) 05/06/2019, 05/30/2020, 06/18/2021, 05/27/2023   Hep A / Hep B 04/06/2012, 10/07/2012   Hepatitis B  05/07/2012   Influenza Split 06/28/2013   Influenza Whole 06/05/2007, 05/13/2008, 06/06/2009, 05/30/2010   Influenza, High Dose Seasonal PF 05/15/2018, 05/31/2022   Influenza,inj,Quad PF,6+ Mos 05/20/2016   Influenza-Unspecified 05/25/2014, 07/08/2015, 05/06/2017, 07/29/2023   Moderna Covid-19 Vaccine Bivalent Booster 9yrs & up 05/31/2022   PFIZER(Purple Top)SARS-COV-2 Vaccination 09/28/2019, 10/12/2019, 05/30/2020   PPD Test 03/31/2023   Pfizer Covid-19 Vaccine Bivalent Booster 27yrs & up 11/18/2021, 07/29/2023   Pneumococcal Conjugate-13 09/18/2015   Pneumococcal Polysaccharide-23 05/13/2008, 05/25/2014   Td 05/30/2010   Tdap 10/27/2020   Zoster Recombinant(Shingrix) 05/18/2018, 10/16/2018  Zoster, Live 10/21/2007   Pertinent  Health Maintenance Due  Topic Date Due   FOOT EXAM  Never done   INFLUENZA VACCINE  03/26/2024   HEMOGLOBIN A1C  04/12/2024   OPHTHALMOLOGY EXAM  11/06/2024      03/31/2023   10:48 AM 10/09/2023    1:31 PM 11/13/2023    1:50 PM 12/31/2023    8:52 AM 02/10/2024    9:05 AM  Fall Risk  Falls in the past year? 0 0 0 0 0  Was there an injury with Fall? 0 0 0  0  Fall Risk Category Calculator 0 0 0  0  Patient at Risk for Falls Due to No Fall Risks History of fall(s) Impaired balance/gait;Impaired mobility    Fall risk Follow up Falls evaluation completed Falls evaluation completed Falls evaluation completed     Functional Status Survey:    Vitals:   02/16/24 1135  BP: 113/64  Pulse: 89  Resp: 19  Temp: (!) 97.2 F (36.2 C)  SpO2: 97%  Weight: 244 lb (110.7 kg)  Height: 5' 6.5 (1.689 m)   Body mass index is 38.79 kg/m. Physical Exam Vitals and nursing note reviewed.  Constitutional:      Appearance: He is obese.  HENT:     Head: Normocephalic and atraumatic.     Nose: Nose normal.   Eyes:     Extraocular Movements: Extraocular movements intact.     Conjunctiva/sclera: Conjunctivae normal.     Pupils: Pupils are equal, round, and reactive  to light.    Cardiovascular:     Rate and Rhythm: Normal rate. Rhythm irregular.     Heart sounds: No murmur heard.    Comments: Left upper chest pace maker, defibrillator  Pulmonary:     Effort: Pulmonary effort is normal.     Breath sounds: Rales present.     Comments: Right base rales.  Abdominal:     General: Bowel sounds are normal.     Palpations: Abdomen is soft.     Tenderness: There is no abdominal tenderness. There is no right CVA tenderness, left CVA tenderness, guarding or rebound.   Musculoskeletal:     Cervical back: Normal range of motion and neck supple.     Right lower leg: Edema present.     Left lower leg: Edema present.     Comments: trace edema BLE   Skin:    General: Skin is warm and dry.     Findings: Bruising present.     Comments: Left fore arm, left dorsum of hand multiple skin tears, closed with steri strips, no s/s of infection or active bleeding    Neurological:     General: No focal deficit present.     Mental Status: He is alert and oriented to person, place, and time. Mental status is at baseline.     Motor: No weakness.     Gait: Gait abnormal.   Psychiatric:        Mood and Affect: Mood normal.        Behavior: Behavior normal.        Thought Content: Thought content normal.        Judgment: Judgment normal.     Comments: Stated he feels difficulty adjusting to [facility]      Labs reviewed: Recent Labs    06/30/23 1616 07/01/23 0355 07/02/23 0415 11/25/23 0728 11/26/23 0417 11/27/23 0410 12/16/23 0000 12/23/23 1208 01/20/24 1702  NA  --  140   < >  134* 134* 134* 134* 132* 129*  K  --  4.1   < > 4.0 3.8 3.8 3.9 3.7 3.4*  CL  --  106   < > 98 97* 96* 92* 93* 91*  CO2  --  24   < > 27 27 29  32* 26 26  GLUCOSE  --  125*   < > 87 94 106*  --  92 131*  BUN  --  14   < > 41* 43* 46* 25* 20 32*  CREATININE  --  1.30*   < > 1.13 1.11 1.32* 1.3 1.58* 1.54*  CALCIUM   --  7.8*   < > 7.8* 7.8* 7.7* 7.8* 8.1* 8.3*  MG 2.7*  --    < > 2.7*  2.6* 2.5*  --   --   --   PHOS 2.3* 3.3  --   --   --   --   --   --   --    < > = values in this interval not displayed.   Recent Labs    11/18/23 1918 11/19/23 1844 11/20/23 1317 12/16/23 0000 01/20/24 1702  AST 76*  --  93* 33 66*  ALT 45*  --  70* 33 31  ALKPHOS 147*  --  151* 160* 130*  BILITOT 0.8  --  0.8  --  0.7  PROT 6.7 7.3 6.5  --  6.9  ALBUMIN  2.1*  --  2.1* 2.4* 2.6*   Recent Labs    11/26/23 0417 11/27/23 0410 12/16/23 0000 01/20/24 1702  WBC 19.1* 20.4* 9.7 17.7*  NEUTROABS 16.4* 16.9* 5,645.00 12.9*  HGB 13.4 13.3 12.8* 14.0  HCT 43.0 41.9 41 44.2  MCV 86.2 84.5  --  87.9  PLT 345 352 211 345   Lab Results  Component Value Date   TSH 0.169 (L) 11/18/2023   Lab Results  Component Value Date   HGBA1C 6.8 (H) 10/14/2023   Lab Results  Component Value Date   CHOL 108 10/14/2023   HDL 33 (L) 10/14/2023   LDLCALC 55 10/14/2023   TRIG 113 10/14/2023   CHOLHDL 3.3 10/14/2023    Significant Diagnostic Results in last 30 days:  XR Knee 1-2 Views Right Result Date: 02/06/2024 AP lateral radiographs right knee reviewed.  Total knee prosthesis in good position alignment with no complicating features.  XR Knee 1-2 Views Left Result Date: 02/06/2024 AP lateral radiographs left knee reviewed.  Total knee prosthesis in good position alignment no complicating features.  CT CHEST WO CONTRAST Result Date: 01/27/2024 CLINICAL DATA:  Blunt chest trauma, fall, right rib pain EXAM: CT CHEST WITHOUT CONTRAST TECHNIQUE: Multidetector CT imaging of the chest was performed following the standard protocol without IV contrast. RADIATION DOSE REDUCTION: This exam was performed according to the departmental dose-optimization program which includes automated exposure control, adjustment of the mA and/or kV according to patient size and/or use of iterative reconstruction technique. COMPARISON:  None Available. FINDINGS: Cardiovascular: Status post coronary artery bypass grafting  and transcatheter aortic valve replacement. Left subclavian 3 lead pacemaker in place with leads within the left ventricular venous outflow, right atrium and right ventricle. Mild global cardiomegaly. No pericardial effusion. Central pulmonary arteries are enlarged in keeping with changes of pulmonary arterial hypertension. Moderate atherosclerotic calcification within the thoracic aorta. Bulky calcification results in at least 50 percent stenosis of the proximal right subclavian artery though this is not optimally assessed on this noncontrast examination. Mediastinum/Nodes: No enlarged mediastinal or axillary lymph nodes.  Thyroid  gland, trachea, and esophagus demonstrate no significant findings. Lungs/Pleura: Small bilateral pleural effusions are present with associated bibasilar atelectasis. Left pleural effusion has decreased and right pleural effusion has increased in size since prior exam. Previously noted extensive airspace infiltrate within right lung, in keeping with acute infection, has largely resolved though a small amount of residual reticulation and inflammatory infiltrate persists. No pneumothorax. No central obstructing lesion. Upper Abdomen: No acute abnormality. Musculoskeletal: Acute fractures of the right 9th and 10th ribs are seen laterally. Minimal displacement. Osseous structures are otherwise age-appropriate. No lytic or blastic bone lesion. IMPRESSION: 1. Acute minimally displaced fractures of the right 9th and 10th ribs laterally. No pneumothorax. 2. Small bilateral pleural effusions with associated bibasilar atelectasis. 3. Previously noted extensive airspace infiltrate within the right lung, in keeping with acute infection, has largely resolved though a small amount of residual reticulation and inflammatory infiltrate persists. 4. Status post coronary artery bypass grafting and transcatheter aortic valve replacement. 5. Enlargement of the central pulmonary arteries in keeping with changes  of pulmonary arterial hypertension. 6. Bulky calcification results in at least 50 percent stenosis of the proximal right subclavian artery though this is not optimally assessed on this noncontrast examination. 7. Aortic atherosclerosis. Aortic Atherosclerosis (ICD10-I70.0). Electronically Signed   By: Dorethia Molt M.D.   On: 01/27/2024 22:49   XR Lumbar Spine Complete Result Date: 01/26/2024 XRs of the lumbar spine from 01/26/2024 were independently reviewed and interpreted, showing disc height loss with anterior osteophyte formation at L1/2, L2/3, and L3/4. Decreased lumbar lordosis. Grade 1 spondylolisthesis at L4/5. No fracture or dislocation seen. No evidence of instability on flexion/extension views.   DG Ribs Unilateral W/Chest Right Result Date: 01/20/2024 CLINICAL DATA:  R rib pain sp fall EXAM: RIGHT RIBS AND CHEST - 3+ VIEW COMPARISON:  Dec 31, 2023 FINDINGS: Fine bony detail is degraded by patient body habitus. Central pulmonary vascular congestion. No focal airspace consolidation, pleural effusion, or pneumothorax. Moderate cardiomegaly. Aortic valve replacement. Left chest pacemaker/AICD with leads terminating in the right atrium, right ventricle, and coronary sinus. Sternotomy wires. No acute, displaced rib fracture or destructive lesion. IMPRESSION: 1. Fine bony detail is degraded by patient body habitus. No acute, displaced rib fracture visualized. 2. Moderate cardiomegaly with central pulmonary vascular congestion. Electronically Signed   By: Rogelia Myers M.D.   On: 01/20/2024 20:10   CT ABDOMEN PELVIS W CONTRAST Result Date: 01/20/2024 CLINICAL DATA:  Abdominal pain, acute, nonlocalized witnessed fall. Pt c/o right side, rib pain. No LOC. Pt. Did not hit his head. Pt is on thinners. Pt. Has pacemaker. Pt. Is currently on O2 @ 2 liters. Pt. Wears O2 PRN at home. EXAM: CT ABDOMEN AND PELVIS WITH CONTRAST TECHNIQUE: Multidetector CT imaging of the abdomen and pelvis was performed using the  standard protocol following bolus administration of intravenous contrast. RADIATION DOSE REDUCTION: This exam was performed according to the departmental dose-optimization program which includes automated exposure control, adjustment of the mA and/or kV according to patient size and/or use of iterative reconstruction technique. CONTRAST:  OMNIPAQUE  IOHEXOL  300 MG/ML  SOLN COMPARISON:  Chest x-ray 11/19/2023. FINDINGS: Lower chest: Bilateral trace, left greater than right, pleural effusions. Bilateral lower lobe atelectasis. Vague interlobular wall thickening and Peri bronchovascular ground-glass airspace opacities. Aortic valve replacement. Cardiac leads noted. Coronary artery calcification. Trace hiatal hernia. Cardiomegaly. Hepatobiliary: The liver is enlarged measuring up to 20.5 cm. The hepatic parenchyma is diffusely hypodense compared to the splenic parenchyma consistent with fatty infiltration. Contracted gallbladder.  No focal liver abnormality. No gallstones, gallbladder wall thickening, or pericholecystic fluid. No biliary dilatation.No laceration or subcapsular hematoma. Pancreas: Normal pancreatic contour. No main pancreatic duct dilatation. Spleen: Not enlarged. No focal lesion. No laceration, subcapsular hematoma, or vascular injury. Adrenals/Urinary Tract: No nodularity bilaterally. Bilateral kidneys enhance symmetrically. No hydronephrosis. No contusion, laceration, or subcapsular hematoma. No injury to the vascular structures or collecting systems. No hydroureter. The urinary bladder is unremarkable. Stomach/Bowel: No small or large bowel wall thickening or dilatation. Colonic diverticulosis. The appendix is unremarkable. Vasculature/Lymphatics: Severe atherosclerotic plaque. No abdominal aorta or iliac aneurysm. No active contrast extravasation or pseudoaneurysm. No abdominal, pelvic, inguinal lymphadenopathy. Reproductive: Normal. Other: No simple free fluid ascites. No pneumoperitoneum. No  hemoperitoneum. No mesenteric hematoma identified. No organized fluid collection. Musculoskeletal: No significant soft tissue hematoma. No acute pelvic fracture. No acute spinal fracture. Similar-appearing mild compression fracture of the L2 inferior endplate. Grade 1 anterolisthesis of L4 on L5. Mild retrolisthesis of L2 on L3 and L3 on L4. Other ports and devices: None. IMPRESSION: 1.  No acute intra-abdominal, intrapelvic traumatic injury. 2. No acute fracture or traumatic malalignment of the lumbar spine. Other imaging findings of potential clinical significance: 1. Cardiomegaly with bilateral trace pleural effusions. Bilateral lower lobe atelectasis with question superimposed edema or developing infection/inflammation. Correlate with chest x-ray PA and lateral view. 2. Trace hiatal hernia. 3. Hepatomegaly and hepatic steatosis. 4. Colonic diverticulosis with no acute diverticulitis. 5.  Aortic Atherosclerosis (ICD10-I70.0)-severe. Electronically Signed   By: Morgane  Naveau M.D.   On: 01/20/2024 19:09    Assessment/Plan Osteoarthritis s/p left replacement, feels weak and clicks sometimes, pending MRI, ambulates with walker, risk of falling. Fell when outing and resulted left arm/back of hand multiple skin tear.   UTI (urinary tract infection) treated with Fosfomycin by Urology, 01/16/24 urology Fosfomycin 3gm po q48hr x 2 wks.   Leukocytosis  chronic, wbc 17.7, neutrophils 73% 01/20/24  GOUT no flare up over a year, R+L wrist, takes Allopurinol , colchicine ,  uric acid 7.6 10/10/22  Persistent atrial fibrillation (HCC) s/p pace maker, defibrillator , on Amiodarone , Pradaxa , Metoprolol , followed by cardiology.   Hyperlipidemia on ASA, Atorvastatin , LDL 55 10/14/23  Coronary artery disease involving native coronary artery of native heart without angina pectoris Hx of CABF, TAVR, no chest pain, taking Isosorbide   Chronic congestive heart failure (HCC)  mild edema BLE, RLE>LLE, off Farxiga  due  to recurrent UTI, on Entresto , Metoprolol , Metolazone , Spironolactone , Torsemide , BNP 824 01/16/24 Pending BNP, BNP  Hyponatremia Na 129, K 3.4 01/20/24  Chronic kidney disease, stage 3a (HCC) Bun/creat 32/1.54 01/20/24  Hypothyroidism  on Levothyroxine , TSH 3.27 10/14/23  Essential hypertension  blood pressure is controlled, on Entresto , Metoprolol , diuretics.   Gastroesophageal reflux disease  stable, on Omeprazole , Hx of Reglan  use, Hgb 14 01/20/24  Recurrent depression (HCC) better with Effexor , failed Zoloft , feels sleeps too much and depressed.      Family/ staff Communication: plan of care reviewed with the patient and charge nurse.   Labs/tests ordered:  pending BMP, BNP ordered by Cardiology.

## 2024-02-16 NOTE — Assessment & Plan Note (Signed)
 mild edema BLE, RLE>LLE, off Farxiga  due to recurrent UTI, on Entresto , Metoprolol , Metolazone , Spironolactone , Torsemide , BNP 824 01/16/24 Pending BNP, BNP

## 2024-02-16 NOTE — Assessment & Plan Note (Signed)
 s/p left replacement, feels weak and clicks sometimes, pending MRI, ambulates with walker, risk of falling. Fell when outing and resulted left arm/back of hand multiple skin tear.

## 2024-02-16 NOTE — Assessment & Plan Note (Signed)
 Na 129, K 3.4 01/20/24

## 2024-02-16 NOTE — Assessment & Plan Note (Signed)
 s/p pace maker, defibrillator , on Amiodarone, Pradaxa, Metoprolol, followed by cardiology.

## 2024-02-16 NOTE — Assessment & Plan Note (Signed)
 Hx of CABF, TAVR, no chest pain, taking Isosorbide

## 2024-02-16 NOTE — Assessment & Plan Note (Signed)
 on Levothyroxine, TSH 3.27 10/14/23

## 2024-02-16 NOTE — Assessment & Plan Note (Addendum)
 treated with Fosfomycin by Urology, 01/16/24 urology Fosfomycin 3gm po q48hr x 2 wks.

## 2024-02-16 NOTE — Assessment & Plan Note (Signed)
 Bun/creat 32/1.54 01/20/24

## 2024-02-16 NOTE — Assessment & Plan Note (Signed)
 blood pressure is controlled, on Entresto , Metoprolol , diuretics.

## 2024-02-16 NOTE — Assessment & Plan Note (Signed)
 Hgb A1c 6.8 10/14/23, on insulin 

## 2024-02-16 NOTE — Assessment & Plan Note (Signed)
 better with Effexor , failed Zoloft , feels sleeps too much and depressed.

## 2024-02-16 NOTE — Assessment & Plan Note (Signed)
 stable, on Omeprazole , Hx of Reglan  use, Hgb 14 01/20/24

## 2024-02-16 NOTE — Assessment & Plan Note (Signed)
 chronic, wbc 17.7, neutrophils 73% 01/20/24

## 2024-02-16 NOTE — Assessment & Plan Note (Signed)
 on ASA, Atorvastatin, LDL 55 10/14/23

## 2024-02-17 ENCOUNTER — Ambulatory Visit (HOSPITAL_BASED_OUTPATIENT_CLINIC_OR_DEPARTMENT_OTHER): Admitting: Pulmonary Disease

## 2024-02-17 ENCOUNTER — Telehealth: Payer: Self-pay

## 2024-02-17 ENCOUNTER — Encounter (HOSPITAL_BASED_OUTPATIENT_CLINIC_OR_DEPARTMENT_OTHER): Payer: Self-pay | Admitting: Primary Care

## 2024-02-17 ENCOUNTER — Ambulatory Visit (HOSPITAL_BASED_OUTPATIENT_CLINIC_OR_DEPARTMENT_OTHER): Admitting: Primary Care

## 2024-02-17 VITALS — BP 112/70 | HR 101 | Ht 66.5 in | Wt 243.0 lb

## 2024-02-17 DIAGNOSIS — R0602 Shortness of breath: Secondary | ICD-10-CM | POA: Diagnosis not present

## 2024-02-17 DIAGNOSIS — J449 Chronic obstructive pulmonary disease, unspecified: Secondary | ICD-10-CM

## 2024-02-17 DIAGNOSIS — G4733 Obstructive sleep apnea (adult) (pediatric): Secondary | ICD-10-CM

## 2024-02-17 DIAGNOSIS — I5022 Chronic systolic (congestive) heart failure: Secondary | ICD-10-CM | POA: Diagnosis not present

## 2024-02-17 DIAGNOSIS — Z87891 Personal history of nicotine dependence: Secondary | ICD-10-CM

## 2024-02-17 LAB — PULMONARY FUNCTION TEST
DL/VA % pred: 95 %
DL/VA: 3.67 ml/min/mmHg/L
DLCO cor % pred: 62 %
DLCO cor: 13.29 ml/min/mmHg
DLCO unc % pred: 61 %
DLCO unc: 13.06 ml/min/mmHg
FEF 25-75 Post: 1.94 L/s
FEF 25-75 Pre: 1.44 L/s
FEF2575-%Change-Post: 34 %
FEF2575-%Pred-Post: 143 %
FEF2575-%Pred-Pre: 106 %
FEV1-%Change-Post: 6 %
FEV1-%Pred-Post: 80 %
FEV1-%Pred-Pre: 75 %
FEV1-Post: 1.77 L
FEV1-Pre: 1.67 L
FEV1FVC-%Change-Post: 0 %
FEV1FVC-%Pred-Pre: 111 %
FEV6-%Change-Post: 6 %
FEV6-%Pred-Post: 76 %
FEV6-%Pred-Pre: 71 %
FEV6-Post: 2.26 L
FEV6-Pre: 2.12 L
FEV6FVC-%Pred-Post: 109 %
FEV6FVC-%Pred-Pre: 109 %
FVC-%Change-Post: 6 %
FVC-%Pred-Post: 70 %
FVC-%Pred-Pre: 65 %
FVC-Post: 2.26 L
FVC-Pre: 2.13 L
Post FEV1/FVC ratio: 78 %
Post FEV6/FVC ratio: 100 %
Pre FEV1/FVC ratio: 78 %
Pre FEV6/FVC Ratio: 100 %
RV % pred: 62 %
RV: 1.66 L
TLC % pred: 65 %
TLC: 4.25 L

## 2024-02-17 MED ORDER — SPIRIVA RESPIMAT 2.5 MCG/ACT IN AERS: 2.0000 | INHALATION_SPRAY | Freq: Every day | RESPIRATORY_TRACT | 5 refills | Status: DC

## 2024-02-17 MED ORDER — SPIRIVA RESPIMAT 2.5 MCG/ACT IN AERS: 2.0000 | INHALATION_SPRAY | Freq: Every day | RESPIRATORY_TRACT | Status: AC

## 2024-02-17 NOTE — Telephone Encounter (Signed)
 Sure but the falling likely not knee related thx

## 2024-02-17 NOTE — Addendum Note (Signed)
 Addended by: HOPE ALMARIE ORN on: 02/17/2024 05:06 PM   Modules accepted: Orders

## 2024-02-17 NOTE — Progress Notes (Signed)
 Full PFT performed today.

## 2024-02-17 NOTE — Progress Notes (Addendum)
 @Patient  ID: Justin [name], male    DOB: Nov 19, 1933, 89 y.o.   MRN: [id number]  No chief complaint on file.   Referring provider: Mast, Man X, NP  HPI: 89 year old, former smoker. PMH significant for CHF, CAD, MI, aortic stenosis s/p TAVR, afib, hypothyroidism, type 2 diabetes, irritable larynx syndrome, asthma, viral pneumonia, CKD, CABG x4, hyperlipidemia.  Previous [facility] pulmonary encounter:  11/12/23- Dr. Jess Mr. Pallas is an 89 y.o. w/ a h/o hyperlipidemia, obesity, gout, DMII, HTN, GERD, anxiety and depression who presents for c/o loud snoring and excessive daytime sleepiness which has been present for several years. Reports nocturnal awakenings due to nocturia, however does not have difficulty falling back to sleep. Reports a 40 lb weight loss over the last few months. Admits to dry mouth. Denies morning headaches, RLS symptoms, dream enactment, cataplexy, hypnagogic or hypnapompic hallucinations. Reports a family history of sleep apnea. Denies drowsy driving. Denies caffeine intake. Denies alcohol, tobacco or illicit drug use.    Bedtime 11-11:30 pm Sleep onset 5 mins Rise time 9:30 am  12/31/23- Dr. Neda   Chief complaint:   Cough and shortness of breath   HPI:   Shortness of breath Desaturations during physical therapy   History of heart failure, cardiomyopathy Recent evaluation for possible sleep apnea   Was recently hospitalized with respite tract infection, pleural effusion for which he required thoracentesis Was hospitalized for by 11 days   Has been noted to desaturate into the 80s with activity   He does have macular degeneration Needs a walker for stability He is able to walk for about 5 to 6 minutes Ongoing treatment for urinary tract infection   History of chronic systolic failure, coronary artery disease, CABG in 2007, aortic stenosis s/p TAVR, permanent A-fib, HFrEF, cardiac resynchronization therapy in 2024, chronic kidney disease,  hypothyroidism   Reformed smoker quit over 60 years ago but was smoking about 3 and half packs a day   Recent evaluation with Dr. Jess for sleep apnea, has a sleep study scheduled   No pertinent occupational history  Assessment:  Shortness of breath is likely multifactorial Deconditioning likely playing a role He does have a history of heart failure with reduced ejection fraction which is being optimized   Recent hospitalization with significant deconditioning   Exercise desaturations   Atrial fibrillation   Coronary artery disease   Aortic stenosis s/p intervention   Plan/Recommendations:   Obtain chest x-ray today   Exercise oximetry   Follow-up with your sleep study   Graded activities as tolerated   Try and ensure that you continue with physical therapy as tolerated   Continue to optimize treatment for heart failure   Follow-up in 6 to 8 weeks   Was ambulated in the office today and did desaturate with exercise Requires 4 L of oxygen via portable oxygen concentrator, 2 L of continuous flow     02/17/2024- Interim hx  Discussed the use of AI scribe software for clinical note transcription with the patient, who gave verbal consent to proceed.  History of Present Illness   Justin [name] is an 89 year old male with congestive heart failure and chronic obstructive pulmonary disease who presents for a follow-up breathing test. He is accompanied by his daughter.   He is here for a follow-up breathing test after being hospitalized for 11 days in March due to respiratory failure, multifocal pneumonia and heart failure. A sleep study revealed severe obstructive sleep apnea. Auto CPAP  therapy was recommended but delayed due to follow-up.   He has a significant cardiac history including congestive heart failure, coronary artery disease, aortic stenosis, and atrial fibrillation. He underwent CABG in 2007 and a transaortic valve replacement. He is currently on  amiodarone , and his diuretic dosage was recently increased to manage fluid overload, noted in his lungs, legs, and jugular vein.  He has a history of smoking, having quit 62 years ago after smoking three and a half packs a day. He experiences chronic shortness of breath and a cough, described as 'just clearing my throat.' Lung function measured at 80% post-bronchodilator with curvature on flow volume loop indicating mild obstructive lung disease.  He uses oxygen therapy, with four liters during the day when using portable concentrator and two liters when using continuous flow oxygen. He is 'always short of breath'. His weight has fluctuated due to fluid retention, and he monitors it closely.  No recent flare-ups or other concerning symptoms.      Pulmonary function testing 02/17/24>. FVC 2.26 (70%), FEV1 1.77 (80%), ratio 78, TLC 65%, DLCOunc 13.06 (61%)  Mild obstruction with curvature on flow volume loop. No BD response. Moderate diffusion defect.   Allergies  Allergen Reactions   Peanut-Containing Drug Products Anaphylaxis   Sulfonamide Derivatives Anaphylaxis   Amlodipine  Swelling and Other (See Comments)    Swelling in ankles   Eliquis  [Apixaban ] Other (See Comments)    Back/hip pain   Lisinopril  Cough   Xarelto  [Rivaroxaban ] Other (See Comments)    Back/hip pain    Immunization History  Administered Date(s) Administered   Fluad Quad(high Dose 65+) 05/06/2019, 05/30/2020, 06/18/2021, 05/27/2023   Hep A / Hep B 04/06/2012, 10/07/2012   Hepatitis B 05/07/2012   Influenza Split 06/28/2013   Influenza Whole 06/05/2007, 05/13/2008, 06/06/2009, 05/30/2010   Influenza, High Dose Seasonal PF 05/15/2018, 05/31/2022   Influenza,inj,Quad PF,6+ Mos 05/20/2016   Influenza-Unspecified 05/25/2014, 07/08/2015, 05/06/2017, 07/29/2023   Moderna Covid-19 Vaccine Bivalent Booster 71yrs & up 05/31/2022   PFIZER(Purple Top)SARS-COV-2 Vaccination 09/28/2019, 10/12/2019, 05/30/2020   PPD Test  03/31/2023   Pfizer Covid-19 Vaccine Bivalent Booster 86yrs & up 11/18/2021, 07/29/2023   Pneumococcal Conjugate-13 09/18/2015   Pneumococcal Polysaccharide-23 05/13/2008, 05/25/2014   Td 05/30/2010   Tdap 10/27/2020   Zoster Recombinant(Shingrix) 05/18/2018, 10/16/2018   Zoster, Live 10/21/2007    Past Medical History:  Diagnosis Date   Age-related macular degeneration, dry, both eyes    Allergic    24/7; 365 days/year; I'm allergic to pollens, dust, all [place] grasses/trees, mold, mildue, cats, dogs (01/07/2017)   Anal fissure    Asthma    sees Dr. Alaine    Benign prostatic hypertrophy    (sees Dr. Colan   CAD (coronary artery disease)    a. s/p CABG 2007. b. Cath 08/2016 - 4/4 patent grafts.   Carotid bruit    carotid u/s 10/10: 0.39% bilaterally   Chronic combined systolic and diastolic CHF (congestive heart failure) (HCC)    Complication of anesthesia 1980s   w/anal cyst OR, he gave me a saddle block then put a narcotic in spinal cord; had a severe reaction to that (01/07/2017)   Congestive heart failure (CHF) [facility])    ED (erectile dysfunction)    Family history of adverse reaction to anesthesia    daughter wakes up during OR (01/07/2017)   GERD (gastroesophageal reflux disease)    Gout    HTN (hypertension)    Hx of colonic polyps    (sees Dr. Abran)  Hyperlipidemia    Hypothyroidism    Moderate to severe aortic stenosis    a. s/p TAVR 02/2017.   Myocardial infarction [facility]) ~ 2000   Obesity    Osteoarthritis    was in my knees, hands (01/07/2017 )   PAF (paroxysmal atrial fibrillation) (HCC)    a. documented post TAVR.   Precancerous skin lesion    (sees Dr. 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   29 mm Edwards Sapien 3 transcatheter heart valve placed via percutaneous right transfemoral approach    Tobacco History: Social History   Tobacco Use   Smoking Status Former   Current packs/day: 0.00   Average packs/day: 3.5 packs/day for 13.0 years (45.5 ttl pk-yrs)   Types: Cigarettes   Start date: 30   Quit date: 1963   Years since quitting: 62.5  Smokeless Tobacco Never  Tobacco Comments   Former smoker 04/19/21   Counseling given: Not Answered Tobacco comments: Former smoker 04/19/21   Outpatient Medications Prior to Visit  Medication Sig Dispense Refill   albuterol  (VENTOLIN  HFA) 108 (90 Base) MCG/ACT inhaler Inhale 2 puffs into the lungs every 4 (four) hours as needed for wheezing or shortness of breath. 8 g 5   allopurinol  (ZYLOPRIM ) 100 MG tablet Take 1 tablet (100 mg total) by mouth daily. 90 tablet 3   amiodarone  (PACERONE ) 200 MG tablet Take 1 tablet (200 mg total) by mouth daily. 90 tablet 3   aspirin  81 MG tablet Take 1 tablet (81 mg total) by mouth daily. 30 tablet 0   atorvastatin  (LIPITOR) 20 MG tablet TAKE ONE TABLET ONCE DAILY 90 tablet 2   Baclofen 5 MG TABS Take 5 mg by mouth every 12 (twelve) hours as needed.     calcium  carbonate (OS-CAL) 1250 (500 Ca) MG chewable tablet Chew 1 tablet by mouth daily.     Calcium -Magnesium -Zinc-Vit D3 (CALCIUM -MAGNESIUM -ZINC-D3 PO) Take 2 tablets by mouth daily.     carbamide peroxide (DEBROX) 6.5 % OTIC solution Place 3 drops into both ears daily. Until 02/11/2024 (Patient not taking: Reported on 02/16/2024)     cetirizine (ZYRTEC) 10 MG tablet Take 10 mg by mouth 2 (two) times daily. As needed     colchicine  0.6 MG tablet Take 1 tablet (0.6 mg total) by mouth every 6 (six) hours as needed (gout). 60 tablet 5   dabigatran  (PRADAXA ) 150 MG CAPS capsule TAKE (1) CAPSULE TWICE DAILY. 180 capsule 2   diclofenac  Sodium (VOLTAREN ) 1 % GEL Apply 4 g topically 3 (three) times daily as needed (pain). 50 g 0   docusate sodium  (COLACE) 100 MG capsule Take 100 mg by mouth 2 (two) times daily.     fluticasone  (FLONASE ) 50 MCG/ACT nasal spray Place 2 sprays into both nostrils daily.      insulin  aspart (NOVOLOG ) 100 UNIT/ML injection Inject 4-16 Units into the skin as directed. Inject as per sliding scale: if 141 - 180 = 4 units Inject per sliding scale; 181 - 220 = 6 units Inject per sliding scale; 221 - 260 = 8 units Inject per sliding scale; 261 - 300 = 10 units Inject per sliding scale; 301 - 350 = 12 units Inject per sliding scale; 351 - 400= 14 units Inject per sliding scale; 401+ = 16 units Inject per sliding scale, subcutaneously three times a day     ipratropium (ATROVENT ) 0.03 % nasal spray  Place 2 sprays into both nostrils 2 (two) times daily.     isosorbide  mononitrate (IMDUR ) 30 MG 24 hr tablet Take 1 tablet (30 mg total) by mouth daily. 90 tablet 3   ketotifen  (ZADITOR ) 0.025 % ophthalmic solution Place 1 drop into both eyes daily as needed (for itching).     levothyroxine  (SYNTHROID ) 175 MCG tablet Take 175 mcg by mouth daily.     Menthol , Topical Analgesic, (BIOFREEZE COOL THE PAIN) 4 % GEL Apply 4 % topically in the morning, at noon, in the evening, and at bedtime.     metoCLOPramide  (REGLAN ) 10 MG tablet Take 10 mg by mouth 2 (two) times daily.     metolazone  (ZAROXOLYN ) 2.5 MG tablet Take 1 tablet (2.5 mg total) by mouth once a week every Saturday 12 tablet 3   metoprolol  succinate (TOPROL -XL) 50 MG 24 hr tablet Take 1 tablet (50 mg total) by mouth daily. Take with or immediately following a meal.     montelukast  (SINGULAIR ) 10 MG tablet TAKE ONE TABLET BY MOUTH ONCE DAILY IN THE EVENING 90 tablet 3   Multiple Vitamins-Minerals (MULTI-VITAMIN GUMMIES) CHEW Chew 2 Pieces by mouth daily.     Multiple Vitamins-Minerals (PRESERVISION AREDS 2) CAPS Take 2 capsules by mouth in the morning and at bedtime.     nystatin (MYCOSTATIN/NYSTOP) powder Apply 1 Application topically 2 (two) times daily.     oxyCODONE  (ROXICODONE ) 5 MG immediate release tablet Take 0.5 tablets (2.5 mg total) by mouth every 4 (four) hours as needed for severe pain (pain score 7-10). 30 tablet 0    pantoprazole  (PROTONIX ) 40 MG tablet Take 40 mg by mouth 2 (two) times daily.     polyethylene glycol powder (GLYCOLAX/MIRALAX) 17 GM/SCOOP powder Take 17 g by mouth daily as needed for moderate constipation.     POTASSIUM CHLORIDE  ER PO Take 60 mEq by mouth 2 (two) times daily.     potassium chloride  SA (KLOR-CON  M) 20 MEQ tablet Take 1 tablet (20 mEq total) by mouth 2 (two) times daily. 180 tablet 3   Probiotic Product (PROBIOTIC PO) Take 1 capsule by mouth daily.     sacubitril-valsartan (ENTRESTO ) 24-26 MG Take 0.5 tablets by mouth 2 (two) times daily.     spironolactone  (ALDACTONE ) 25 MG tablet Take 1 tablet (25 mg total) by mouth daily. 30 tablet 9   tamsulosin (FLOMAX) 0.4 MG CAPS capsule Take 0.4 mg by mouth daily.     torsemide  (DEMADEX ) 20 MG tablet Take 4 tablets (80 mg total) by mouth daily. Take 80mg  twice daily for 4 days only and then return to 80mg  once daily (Patient not taking: Reported on 02/16/2024) 120 tablet 3   Torsemide  40 MG TABS Take 80 mg by mouth daily.     Torsemide  40 MG TABS Take 80 mg by mouth 2 (two) times daily.     triamcinolone  cream (KENALOG ) 0.1 % Apply 1 application  topically daily as needed for dry skin (affected areas).     triamcinolone  cream (KENALOG ) 0.1 % Apply 1 Application topically every 12 (twelve) hours as needed.     valACYclovir  (VALTREX ) 500 MG tablet Take 500 mg by mouth every 12 (twelve) hours as needed (cold sores).     venlafaxine  XR (EFFEXOR  XR) 37.5 MG 24 hr capsule Take 1 capsule (37.5 mg total) by mouth daily with breakfast. 30 capsule 1   No facility-administered medications prior to visit.   Review of Systems  Review of Systems  Constitutional: Negative.  HENT: Negative.    Respiratory:  Positive for shortness of breath. Negative for cough and wheezing.   Cardiovascular:  Positive for leg swelling.   Physical Exam  There were no vitals taken for this visit. Physical Exam Constitutional:      General: He is not in acute  distress.    Appearance: Normal appearance. He is not ill-appearing.  HENT:     Head: Normocephalic and atraumatic.   Cardiovascular:     Rate and Rhythm: Normal rate. Rhythm irregular.     Comments: Trace BLE edema Pulmonary:     Effort: Pulmonary effort is normal.     Breath sounds: Normal breath sounds. No wheezing or rhonchi.     Comments: Faint rale left base, otherwise clear  Musculoskeletal:        General: Normal range of motion.   Skin:    General: Skin is warm and dry.   Neurological:     General: No focal deficit present.     Mental Status: He is alert and oriented to person, place, and time. Mental status is at baseline.   Psychiatric:        Mood and Affect: Mood normal.        Behavior: Behavior normal.        Thought Content: Thought content normal.        Judgment: Judgment normal.      Lab Results:  CBC    Component Value Date/Time   WBC 17.7 (H) 01/20/2024 1702   RBC 5.03 01/20/2024 1702   HGB 14.0 01/20/2024 1702   HGB 14.0 05/23/2023 1220   HCT 44.2 01/20/2024 1702   HCT 45.2 05/23/2023 1220   PLT 345 01/20/2024 1702   PLT 415 05/23/2023 1220   MCV 87.9 01/20/2024 1702   MCV 87 05/23/2023 1220   MCH 27.8 01/20/2024 1702   MCHC 31.7 01/20/2024 1702   RDW 19.3 (H) 01/20/2024 1702   RDW 15.9 (H) 05/23/2023 1220   LYMPHSABS 2.9 01/20/2024 1702   LYMPHSABS 2.0 04/23/2017 1012   MONOABS 1.2 (H) 01/20/2024 1702   EOSABS 0.4 01/20/2024 1702   EOSABS 0.3 04/23/2017 1012   BASOSABS 0.1 01/20/2024 1702   BASOSABS 0.1 04/23/2017 1012    BMET    Component Value Date/Time   NA 129 (L) 01/20/2024 1702   NA 134 (A) 12/16/2023 0000   K 3.4 (L) 01/20/2024 1702   CL 91 (L) 01/20/2024 1702   CO2 26 01/20/2024 1702   GLUCOSE 131 (H) 01/20/2024 1702   GLUCOSE 87 06/30/2006 1132   BUN 32 (H) 01/20/2024 1702   BUN 25 (A) 12/16/2023 0000   CREATININE 1.54 (H) 01/20/2024 1702   CREATININE 1.62 (H) 10/14/2023 0729   CALCIUM  8.3 (L) 01/20/2024 1702    GFRNONAA 43 (L) 01/20/2024 1702   GFRAA 66 04/04/2020 1132    BNP    Component Value Date/Time   BNP 823.5 (H) 01/16/2024 1223    ProBNP    Component Value Date/Time   PROBNP 2,293 (H) 10/16/2022 1259   PROBNP 354.0 (H) 03/28/2021 0730    Imaging: XR Knee 1-2 Views Right Result Date: 02/06/2024 AP lateral radiographs right knee reviewed.  Total knee prosthesis in good position alignment with no complicating features.  XR Knee 1-2 Views Left Result Date: 02/06/2024 AP lateral radiographs left knee reviewed.  Total knee prosthesis in good position alignment no complicating features.  CT CHEST WO CONTRAST Result Date: 01/27/2024 CLINICAL DATA:  Blunt chest trauma, fall, right  rib pain EXAM: CT CHEST WITHOUT CONTRAST TECHNIQUE: Multidetector CT imaging of the chest was performed following the standard protocol without IV contrast. RADIATION DOSE REDUCTION: This exam was performed according to the departmental dose-optimization program which includes automated exposure control, adjustment of the mA and/or kV according to patient size and/or use of iterative reconstruction technique. COMPARISON:  None Available. FINDINGS: Cardiovascular: Status post coronary artery bypass grafting and transcatheter aortic valve replacement. Left subclavian 3 lead pacemaker in place with leads within the left ventricular venous outflow, right atrium and right ventricle. Mild global cardiomegaly. No pericardial effusion. Central pulmonary arteries are enlarged in keeping with changes of pulmonary arterial hypertension. Moderate atherosclerotic calcification within the thoracic aorta. Bulky calcification results in at least 50 percent stenosis of the proximal right subclavian artery though this is not optimally assessed on this noncontrast examination. Mediastinum/Nodes: No enlarged mediastinal or axillary lymph nodes. Thyroid  gland, trachea, and esophagus demonstrate no significant findings. Lungs/Pleura: Small  bilateral pleural effusions are present with associated bibasilar atelectasis. Left pleural effusion has decreased and right pleural effusion has increased in size since prior exam. Previously noted extensive airspace infiltrate within right lung, in keeping with acute infection, has largely resolved though a small amount of residual reticulation and inflammatory infiltrate persists. No pneumothorax. No central obstructing lesion. Upper Abdomen: No acute abnormality. Musculoskeletal: Acute fractures of the right 9th and 10th ribs are seen laterally. Minimal displacement. Osseous structures are otherwise age-appropriate. No lytic or blastic bone lesion. IMPRESSION: 1. Acute minimally displaced fractures of the right 9th and 10th ribs laterally. No pneumothorax. 2. Small bilateral pleural effusions with associated bibasilar atelectasis. 3. Previously noted extensive airspace infiltrate within the right lung, in keeping with acute infection, has largely resolved though a small amount of residual reticulation and inflammatory infiltrate persists. 4. Status post coronary artery bypass grafting and transcatheter aortic valve replacement. 5. Enlargement of the central pulmonary arteries in keeping with changes of pulmonary arterial hypertension. 6. Bulky calcification results in at least 50 percent stenosis of the proximal right subclavian artery though this is not optimally assessed on this noncontrast examination. 7. Aortic atherosclerosis. Aortic Atherosclerosis (ICD10-I70.0). Electronically Signed   By: Dorethia Molt M.D.   On: 01/27/2024 22:49   XR Lumbar Spine Complete Result Date: 01/26/2024 XRs of the lumbar spine from 01/26/2024 were independently reviewed and interpreted, showing disc height loss with anterior osteophyte formation at L1/2, L2/3, and L3/4. Decreased lumbar lordosis. Grade 1 spondylolisthesis at L4/5. No fracture or dislocation seen. No evidence of instability on flexion/extension views.   DG  Ribs Unilateral W/Chest Right Result Date: 01/20/2024 CLINICAL DATA:  R rib pain sp fall EXAM: RIGHT RIBS AND CHEST - 3+ VIEW COMPARISON:  Dec 31, 2023 FINDINGS: Fine bony detail is degraded by patient body habitus. Central pulmonary vascular congestion. No focal airspace consolidation, pleural effusion, or pneumothorax. Moderate cardiomegaly. Aortic valve replacement. Left chest pacemaker/AICD with leads terminating in the right atrium, right ventricle, and coronary sinus. Sternotomy wires. No acute, displaced rib fracture or destructive lesion. IMPRESSION: 1. Fine bony detail is degraded by patient body habitus. No acute, displaced rib fracture visualized. 2. Moderate cardiomegaly with central pulmonary vascular congestion. Electronically Signed   By: Rogelia Myers M.D.   On: 01/20/2024 20:10   CT ABDOMEN PELVIS W CONTRAST Result Date: 01/20/2024 CLINICAL DATA:  Abdominal pain, acute, nonlocalized witnessed fall. Pt c/o right side, rib pain. No LOC. Pt. Did not hit his head. Pt is on thinners. Pt. Has pacemaker. Pt. Is  currently on O2 @ 2 liters. Pt. Wears O2 PRN at home. EXAM: CT ABDOMEN AND PELVIS WITH CONTRAST TECHNIQUE: Multidetector CT imaging of the abdomen and pelvis was performed using the standard protocol following bolus administration of intravenous contrast. RADIATION DOSE REDUCTION: This exam was performed according to the departmental dose-optimization program which includes automated exposure control, adjustment of the mA and/or kV according to patient size and/or use of iterative reconstruction technique. CONTRAST:  OMNIPAQUE  IOHEXOL  300 MG/ML  SOLN COMPARISON:  Chest x-ray 11/19/2023. FINDINGS: Lower chest: Bilateral trace, left greater than right, pleural effusions. Bilateral lower lobe atelectasis. Vague interlobular wall thickening and Peri bronchovascular ground-glass airspace opacities. Aortic valve replacement. Cardiac leads noted. Coronary artery calcification. Trace hiatal  hernia. Cardiomegaly. Hepatobiliary: The liver is enlarged measuring up to 20.5 cm. The hepatic parenchyma is diffusely hypodense compared to the splenic parenchyma consistent with fatty infiltration. Contracted gallbladder. No focal liver abnormality. No gallstones, gallbladder wall thickening, or pericholecystic fluid. No biliary dilatation.No laceration or subcapsular hematoma. Pancreas: Normal pancreatic contour. No main pancreatic duct dilatation. Spleen: Not enlarged. No focal lesion. No laceration, subcapsular hematoma, or vascular injury. Adrenals/Urinary Tract: No nodularity bilaterally. Bilateral kidneys enhance symmetrically. No hydronephrosis. No contusion, laceration, or subcapsular hematoma. No injury to the vascular structures or collecting systems. No hydroureter. The urinary bladder is unremarkable. Stomach/Bowel: No small or large bowel wall thickening or dilatation. Colonic diverticulosis. The appendix is unremarkable. Vasculature/Lymphatics: Severe atherosclerotic plaque. No abdominal aorta or iliac aneurysm. No active contrast extravasation or pseudoaneurysm. No abdominal, pelvic, inguinal lymphadenopathy. Reproductive: Normal. Other: No simple free fluid ascites. No pneumoperitoneum. No hemoperitoneum. No mesenteric hematoma identified. No organized fluid collection. Musculoskeletal: No significant soft tissue hematoma. No acute pelvic fracture. No acute spinal fracture. Similar-appearing mild compression fracture of the L2 inferior endplate. Grade 1 anterolisthesis of L4 on L5. Mild retrolisthesis of L2 on L3 and L3 on L4. Other ports and devices: None. IMPRESSION: 1.  No acute intra-abdominal, intrapelvic traumatic injury. 2. No acute fracture or traumatic malalignment of the lumbar spine. Other imaging findings of potential clinical significance: 1. Cardiomegaly with bilateral trace pleural effusions. Bilateral lower lobe atelectasis with question superimposed edema or developing  infection/inflammation. Correlate with chest x-ray PA and lateral view. 2. Trace hiatal hernia. 3. Hepatomegaly and hepatic steatosis. 4. Colonic diverticulosis with no acute diverticulitis. 5.  Aortic Atherosclerosis (ICD10-I70.0)-severe. Electronically Signed   By: Morgane  Naveau M.D.   On: 01/20/2024 19:09     Assessment & Plan:   1. OSA (obstructive sleep apnea) (Primary) - Ambulatory Referral for DME  2. Chronic obstructive pulmonary disease, unspecified COPD type (HCC)  3. Shortness of breath  4. Chronic systolic heart failure (HCC)  Assessment and Plan    Severe obstructive sleep apnea (OSA) (ICD-10: G47.33) The sleep study conducted last month confirmed severe OSA. Due to existing oxygen requirements, blending two liters of oxygen into the CPAP is advised. CPAP usage for a minimum of four hours per night is necessary to meet insurance requirements of 70% usage.  - Start auto CPAP 4 to 20 cm H2O with a nasal mask - Coordinate CPAP and oxygen adapter delivery - Blend 2 liters of oxygen into CPAP for nighttime use - Ensure CPAP usage for a minimum of 4-6 hours per night, 70% of the time - Follow up with Doctor Jess regarding CPAP and oxygen requirements  Mild obstructive lung disease (COPD) (ICD-10: J44.9) Pulmonary function tests indicate mild obstructive lung disease with lung function at 80% post-bronchodilator, no  significant BD response, curvature on flow volume loop. His history of smoking contributed to COPD development. Recommend trial scheduled bronchodilator, Spiriva Respimat 2.5mcg two puffs once daily, to help alleviate dyspnea symptoms - Initiate Spiriva Respimat 2.5mcg daily - Monitor for improvement in dyspnea and activity tolerance - Report changes or lack of improvement via MyChart  Chronic respiratory failure - Continue 2L supplement oxygen to maintain O2 >88-90%  Congestive heart failure (CHF) (ICD-10: I50.9) He has chronic systolic and diastolic heart  failure, coronary artery disease, CABG in 2007, and aortic stenosis with transcatheter aortic valve replacement. Recent hospitalization for respiratory failure, multifocal pneumonia, and decompensated heart failure was managed with antibiotics, steroids, and diuretics. CT scan showed small bilateral pleural effusions and pulmonary artery enlargement, possibly due to sleep apnea or respiratory failure. Chronic dyspnea is likely multifactorial, including mild COPD, cardiac issues, and deconditioning. Diuretic dosage increased from 60 mg once daily to 80 mg twice daily due to fluid retention in legs, abdomen, and jugular vein distension. Weight decreased from 250 lbs to 240 lbs since June 20th, indicating reduced fluid retention. - Continue Torsemide  80 mg twice daily - Monitor weight regularly  Follow-up 6-8 weeks for CPAP compliance and assess response to Spiriva    Recording duration: 21 minutes      Almarie LELON Ferrari, NP 02/17/2024

## 2024-02-17 NOTE — Patient Instructions (Signed)
 Full PFT performed today.

## 2024-02-17 NOTE — Telephone Encounter (Signed)
 Patients daughter called and said patient fell due to knee giving way.  He has a CT scan on his left knee scheduled for Thursday and is wanting to know if we can order a right knee CT as well?  Please advise.

## 2024-02-17 NOTE — Patient Instructions (Addendum)
 VISIT SUMMARY: During your visit, we discussed your recent hospitalization and the results of your sleep study, which confirmed severe obstructive sleep apnea. We also reviewed your lung function and heart health. We discussed the use of a CPAP machine to help manage your sleep apnea and the potential addition of oxygen to this device. We also talked about your medications and the importance of monitoring your weight to manage fluid retention.  YOUR PLAN: -SEVERE OBSTRUCTIVE SLEEP APNEA: This is a condition where your breathing repeatedly stops and starts during sleep. We plan to start you on a CPAP machine, which will provide pressured air to keep your airway open while you sleep. We will also blend two liters of oxygen into the CPAP machine for nighttime use. It's important to use the CPAP machine for at least four hours per night, 70% of the time, to see the best results. Make sure to wear your oxygen with exertion, you need 2L   -MILD OBSTRUCTIVE LUNG DISEASE: This is a condition where your lungs are partially blocked, making it harder for you to breathe. We plan to start you on a medication called Spiriva, which should help relax your airways and improve your lung function. We will monitor your breathing and activity tolerance to see if this medication is helping.  -CONGESTIVE HEART FAILURE: This is a condition where your heart doesn't pump blood as well as it should. We will continue your current medication, Torsemide , to help manage fluid retention. It's important to monitor your weight regularly to ensure this medication is working effectively.  INSTRUCTIONS: Aim to wear CPAP nightly with oxygen for minimum 4-6 hours or longer. Monitor your response to the CPAP therapy and the Spiriva medication, and report any changes or lack of improvement via MyChart. We will schedule a follow-up appointment in six weeks to assess the effectiveness of the CPAP and bronchodilator.  Follow-up 6 weeks with  Dr. Jess or Landry NP   Tiotropium Dry Powder Inhaler (DPI) What is this medication? TIOTROPIUM (tee oh TRO pee um) treats chronic obstructive pulmonary disease (COPD). It works by opening the airways of the lungs, making it easier to breathe. It is often called a controller inhaler. Do not use it to treat a sudden COPD flare-up. This medicine may be used for other purposes; ask your health care provider or pharmacist if you have questions. COMMON BRAND NAME(S): Spiriva HandiHaler What should I tell my care team before I take this medication? They need to know if you have any of these conditions: Glaucoma Kidney disease Prostate disease An unusual or allergic reaction to tiotropium, lactose, other medications, foods, dyes, or preservatives Pregnant or trying to get pregnant Breastfeeding How should I use this medication? This medication is inhaled through the mouth. Do NOT swallow the capsules. Take it as directed on the prescription label at the same time every day. Do not use it more often than directed. Keep taking it unless your care team tells you to stop. A patient package insert for the product will be given with each prescription and refill. Be sure to read this information carefully each time. The sheet may change often. Talk to your care team about the use of this medication in children. Special care may be needed. Overdosage: If you think you have taken too much of this medicine contact a poison control center or emergency room at once. NOTE: This medicine is only for you. Do not share this medicine with others. What if I miss a dose? If  you miss a dose, take it as soon as you can. If it is almost time for your next dose, take only that dose. Do not take double or extra doses. What may interact with this medication? Antihistamines for allergy , cough, and cold Atropine Certain medications for bladder problems, such as oxybutynin, tolterodine Certain medications for Parkinson  disease, such as benztropine, trihexyphenidyl Certain medications for stomach problems, such as dicyclomine, hyoscyamine Certain medications for travel sickness, such as scopolamine Ipratropium This list may not describe all possible interactions. Give your health care provider a list of all the medicines, herbs, non-prescription drugs, or dietary supplements you use. Also tell them if you smoke, drink alcohol, or use illegal drugs. Some items may interact with your medicine. What should I watch for while using this medication? Visit your care team for regular checks on your progress. Tell your care team if your symptoms do not start to get better or if they get worse. Never use this medication for an acute asthma attack. You should use your short-acting rescue inhaler for an acute attack. If your symptoms get worse or if you need your short-acting inhalers more often, call your care team right away. Your mouth may get dry. Chewing sugarless gum or sucking hard candy and drinking plenty of water may help. Contact your care team if the problem does not go away or is severe. This medication may affect your coordination, reaction time, or judgment. Do not drive or operate machinery until you know how this medication affects you. Sit up or stand slowly to reduce the risk of dizzy or fainting spells. Drinking alcohol with this medication can increase the risk of these side effects. What side effects may I notice from receiving this medication? Side effects that you should report to your care team as soon as possible: Allergic reactions--skin rash, itching, hives, swelling of the face, lips, tongue, or throat Sudden eye pain or change in vision such as blurry vision, seeing halos around lights, vision loss Trouble passing urine Wheezing or trouble breathing that is worse after use Side effects that usually do not require medical attention (report to your care team if they continue or are  bothersome): Constipation Dry mouth Runny or stuffy nose Sore throat Upset stomach This list may not describe all possible side effects. Call your doctor for medical advice about side effects. You may report side effects to FDA at 1-800-FDA-1088. Where should I keep my medication? Keep out of the reach of children and pets. Store at room temperature between 20 and 25 degrees C (68 and 77 degrees F). Keep this medication in the original packaging until you are ready to use it. Get rid of any unused medication after the expiration date. To get rid of medications that are no longer needed or have expired: Take the medication to a medication take-back program. Check with your pharmacy or law enforcement to find a location. If you cannot return the medication, ask your pharmacist or care team how to get rid of this medication safely. NOTE: This sheet is a summary. It may not cover all possible information. If you have questions about this medicine, talk to your doctor, pharmacist, or health care provider.  2024 Elsevier/Gold Standard (2022-02-21 00:00:00)   Chronic Obstructive Pulmonary Disease  Chronic obstructive pulmonary disease (COPD) is a long-term (chronic) lung problem. When you have COPD, it can feel harder to breathe in or out. The condition may get worse over time. There are things you can do to keep  yourself as healthy as possible. What are the causes? Smoking. This is the most common cause. Breathing in fumes, smoke, or chemicals for a long time. Genes that are inherited, which means they are passed down from parent to child. What are the signs or symptoms? Shortness of breath. This may happen all the time. This may get worse when you move your body. This may get worse over time. You may have times when this becomes much worse all of a sudden. These are called flare-ups or exacerbations. A long-term cough, with or without thick mucus. Wheezing. Chest tightness. Feeling  tired. Not being able to do activities like you used to do. How is this diagnosed? This condition is diagnosed based on: Your medical history. A physical exam. Lung (pulmonary) function tests. You may have a test that measures the air flow out of the lungs when you breathe out. You may also have tests, including: Chest X-ray. CT scan. Blood tests. How is this treated? This condition may be treated by: Quitting smoking, if you smoke. Using oxygen. Taking medicines. These may include: Inhalers. These have medicines in them that you breathe in. Daily inhalers. These help to prevent symptoms from happening. They are usually taken every day to prevent COPD flare-ups. Quick relief inhalers. These act fast to relieve symptoms. They are used only when needed and provide short-term relief. Other medicines that you breathe in or swallow. These may be used to open the airways, thin mucus, or treat infections. Breathing exercises to help you control or catch your breath. A mucus clearing device, if you have a lot of thick mucus. Pulmonary rehab. A place where you will learn about your condition and the best ways for you to manage it. Surgery. Follow these instructions at home: Medicines Take your medicines as told by your health care provider. Talk to your provider before taking any cough or allergy  medicines. You may need to avoid medicines that cause your lungs to be dry. Lifestyle Several times a day, wash your hands with soap and water for at least 20 seconds. If you cannot use soap and water, use hand sanitizer. This may help keep you from getting an infection. Avoid being around crowds or people who are sick. Do not smoke or use any products that contain nicotine or tobacco. If you need help quitting, ask your provider. Stay active. Learn how to pace your activity during the day. Learn how to breathe to control your stress and catch your breath. Drink enough fluid to keep your pee  (urine) pale yellow, unless you have been told not to. Eat healthy foods. Eat smaller meals more often. Get enough sleep. Most adults need 7 or more hours per night. General instructions Make a COPD action plan with your provider. This helps you to know what to do if you feel worse than usual. Make sure you get all the shots, also called vaccines, that your provider recommends. Ask your provider about a flu shot and a pneumonia shot. If you need home oxygen therapy, ask your provider how often to check your oxygen level with a device called an oximeter. Keep all follow-up visits to review your COPD action plan. Your provider will want to check on your condition often to keep you healthy and out of the hospital. Contact a health care provider if: You are coughing up more mucus than usual. There is a change in the color or thickness of the mucus. It is harder to breathe than usual or you are  short of breath while you are resting. You need to use your quick relief inhaler more often. You have trouble doing your normal activities such as getting dressed or walking in the house. Your skin color or fingernails turn blue. You have a fever or chills. Get help right away if: You are short of breath and cannot: Talk in full sentences. Do normal activities. You have chest pain. You feel confused. These symptoms may be an emergency. Call 911 right away. Do not wait to see if the symptoms will go away. Do not drive yourself to the hospital. This information is not intended to replace advice given to you by your health care provider. Make sure you discuss any questions you have with your health care provider. Document Revised: 05/15/2023 Document Reviewed: 10/28/2022 Elsevier Patient Education  2024 [employer].

## 2024-02-18 ENCOUNTER — Ambulatory Visit: Admitting: Orthopedic Surgery

## 2024-02-18 ENCOUNTER — Other Ambulatory Visit: Payer: Self-pay

## 2024-02-18 DIAGNOSIS — M25561 Pain in right knee: Secondary | ICD-10-CM

## 2024-02-19 ENCOUNTER — Encounter (HOSPITAL_COMMUNITY): Admitting: Internal Medicine

## 2024-02-19 ENCOUNTER — Ambulatory Visit
Admission: RE | Admit: 2024-02-19 | Discharge: 2024-02-19 | Disposition: A | Discharge status: Home / Self Care | Source: Ambulatory Visit | Attending: Orthopedic Surgery | Admitting: Orthopedic Surgery

## 2024-02-19 ENCOUNTER — Ambulatory Visit: Admitting: Orthopedic Surgery

## 2024-02-19 DIAGNOSIS — M25461 Effusion, right knee: Secondary | ICD-10-CM | POA: Diagnosis not present

## 2024-02-19 DIAGNOSIS — I10 Essential (primary) hypertension: Secondary | ICD-10-CM | POA: Diagnosis not present

## 2024-02-19 DIAGNOSIS — M25462 Effusion, left knee: Secondary | ICD-10-CM | POA: Diagnosis not present

## 2024-02-19 DIAGNOSIS — M1712 Unilateral primary osteoarthritis, left knee: Secondary | ICD-10-CM | POA: Diagnosis not present

## 2024-02-19 DIAGNOSIS — M25762 Osteophyte, left knee: Secondary | ICD-10-CM | POA: Diagnosis not present

## 2024-02-19 DIAGNOSIS — M25562 Pain in left knee: Secondary | ICD-10-CM

## 2024-02-19 DIAGNOSIS — Z96653 Presence of artificial knee joint, bilateral: Secondary | ICD-10-CM | POA: Diagnosis not present

## 2024-02-19 DIAGNOSIS — I5042 Chronic combined systolic (congestive) and diastolic (congestive) heart failure: Secondary | ICD-10-CM | POA: Diagnosis not present

## 2024-02-19 DIAGNOSIS — M25561 Pain in right knee: Secondary | ICD-10-CM

## 2024-02-19 DIAGNOSIS — N1831 Chronic kidney disease, stage 3a: Secondary | ICD-10-CM | POA: Diagnosis not present

## 2024-02-20 ENCOUNTER — Telehealth: Payer: Self-pay | Admitting: Radiology

## 2024-02-20 NOTE — Telephone Encounter (Signed)
 Patient's daughter requests call regarding CT results of bilateral knees. I have changed these exams to STAT in [place] to get read sooner.

## 2024-02-23 ENCOUNTER — Telehealth: Payer: Self-pay

## 2024-02-23 DIAGNOSIS — Z792 Long term (current) use of antibiotics: Secondary | ICD-10-CM

## 2024-02-23 MED ORDER — AZITHROMYCIN 250 MG PO TABS: 500.0000 mg | ORAL_TABLET | Freq: Every day | ORAL | Status: DC

## 2024-02-23 NOTE — Telephone Encounter (Signed)
 I called patient's daughter on Friday.  There is a therapy prescription at the front desk for her to pick up today

## 2024-02-23 NOTE — Telephone Encounter (Signed)
 noted

## 2024-02-23 NOTE — Telephone Encounter (Signed)
   Pre-operative Risk Assessment    Patient Name: Justin [name]  DOB: 02/22/1934 MRN: [id number]   Date of last office visit: 02/05/24 Justin CASH, MD Date of next office visit: NONE  Request for Surgical Clearance    Procedure:  Dental Extraction - Amount of Teeth to be Pulled:  2 TEETH  Date of Surgery:  Clearance TBD                                Surgeon:  DR [facility] Surgeon's Group or Practice Name:  [facility] Phone number:  [phone] Fax number:  [phone]   Type of Clearance Requested:   - Medical  - Pharmacy:  Hold Aspirin  and Dabigatran  (Pradaxa )     Type of Anesthesia:  Local    Additional requests/questions:    SignedLucie DELENA [name]   02/23/2024, 4:51 PM

## 2024-02-23 NOTE — Telephone Encounter (Signed)
   Patient Name: Justin [name]  DOB: [birth year] MRN: [id number]  Primary Cardiologist: [name], MD  Chart reviewed as part of pre-operative protocol coverage.   Simple dental extractions (i.e. 1-2 teeth) are considered low risk procedures per guidelines and generally do not require any specific cardiac clearance. It is also generally accepted that for simple extractions and dental cleanings, there is no need to interrupt blood thinner therapy.   SBE prophylaxis is required for the patient from a cardiac standpoint - Azithromycin  500 mg 30 to 60 minutes prior to procedure  I will route this recommendation to the requesting party via Epic fax function and remove from pre-op pool.  Please call with questions.  Wyn Raddle, Jackee Shove, NP 02/23/2024, 5:08 PM

## 2024-03-01 ENCOUNTER — Other Ambulatory Visit (HOSPITAL_COMMUNITY): Payer: Self-pay | Admitting: Cardiology

## 2024-03-01 ENCOUNTER — Ambulatory Visit (HOSPITAL_COMMUNITY)
Admission: RE | Admit: 2024-03-01 | Discharge: 2024-03-01 | Disposition: A | Discharge status: Home / Self Care | Source: Ambulatory Visit | Attending: Adult Health | Admitting: Adult Health

## 2024-03-01 VITALS — BP 90/50 | HR 107 | Wt 252.2 lb

## 2024-03-01 DIAGNOSIS — Z951 Presence of aortocoronary bypass graft: Secondary | ICD-10-CM | POA: Insufficient documentation

## 2024-03-01 DIAGNOSIS — I493 Ventricular premature depolarization: Secondary | ICD-10-CM | POA: Diagnosis not present

## 2024-03-01 DIAGNOSIS — I4821 Permanent atrial fibrillation: Secondary | ICD-10-CM

## 2024-03-01 DIAGNOSIS — Z95810 Presence of automatic (implantable) cardiac defibrillator: Secondary | ICD-10-CM | POA: Diagnosis not present

## 2024-03-01 DIAGNOSIS — R0600 Dyspnea, unspecified: Secondary | ICD-10-CM | POA: Diagnosis not present

## 2024-03-01 DIAGNOSIS — E039 Hypothyroidism, unspecified: Secondary | ICD-10-CM | POA: Diagnosis not present

## 2024-03-01 DIAGNOSIS — Z7902 Long term (current) use of antithrombotics/antiplatelets: Secondary | ICD-10-CM | POA: Insufficient documentation

## 2024-03-01 DIAGNOSIS — Z4502 Encounter for adjustment and management of automatic implantable cardiac defibrillator: Secondary | ICD-10-CM | POA: Insufficient documentation

## 2024-03-01 DIAGNOSIS — Z79899 Other long term (current) drug therapy: Secondary | ICD-10-CM | POA: Diagnosis not present

## 2024-03-01 DIAGNOSIS — Z7901 Long term (current) use of anticoagulants: Secondary | ICD-10-CM | POA: Insufficient documentation

## 2024-03-01 DIAGNOSIS — Z7982 Long term (current) use of aspirin: Secondary | ICD-10-CM | POA: Diagnosis not present

## 2024-03-01 DIAGNOSIS — Z952 Presence of prosthetic heart valve: Secondary | ICD-10-CM | POA: Insufficient documentation

## 2024-03-01 DIAGNOSIS — Z794 Long term (current) use of insulin: Secondary | ICD-10-CM | POA: Insufficient documentation

## 2024-03-01 DIAGNOSIS — I350 Nonrheumatic aortic (valve) stenosis: Secondary | ICD-10-CM | POA: Insufficient documentation

## 2024-03-01 DIAGNOSIS — Z79890 Hormone replacement therapy (postmenopausal): Secondary | ICD-10-CM | POA: Diagnosis not present

## 2024-03-01 DIAGNOSIS — I5042 Chronic combined systolic (congestive) and diastolic (congestive) heart failure: Secondary | ICD-10-CM | POA: Diagnosis not present

## 2024-03-01 DIAGNOSIS — I2510 Atherosclerotic heart disease of native coronary artery without angina pectoris: Secondary | ICD-10-CM | POA: Insufficient documentation

## 2024-03-01 DIAGNOSIS — Z87440 Personal history of urinary (tract) infections: Secondary | ICD-10-CM | POA: Diagnosis not present

## 2024-03-01 DIAGNOSIS — I5022 Chronic systolic (congestive) heart failure: Secondary | ICD-10-CM | POA: Diagnosis not present

## 2024-03-01 DIAGNOSIS — I482 Chronic atrial fibrillation, unspecified: Secondary | ICD-10-CM | POA: Diagnosis not present

## 2024-03-01 DIAGNOSIS — N189 Chronic kidney disease, unspecified: Secondary | ICD-10-CM | POA: Diagnosis not present

## 2024-03-01 NOTE — Patient Instructions (Addendum)
 TAKE 80 mg  ( 4 Tabs) of Torsemide  Daily   Blood work on 03/09/24 at Facility   Your physician recommends that you schedule a follow-up appointment in: 2-3 weeks.  If you have any questions or concerns before your next appointment please send us  a message through [place] or call our office at [phone].    TO LEAVE A MESSAGE FOR THE NURSE SELECT OPTION 2, PLEASE LEAVE A MESSAGE INCLUDING: YOUR NAME DATE OF BIRTH CALL BACK NUMBER REASON FOR CALL**this is important as we prioritize the call backs  YOU WILL RECEIVE A CALL BACK THE SAME DAY AS LONG AS YOU CALL BEFORE 4:00 PM  At the [facility], you and your health needs are our priority. As part of our continuing mission to provide you with exceptional heart care, we have created designated Provider Care Teams. These Care Teams include your primary Cardiologist (physician) and Advanced Practice Providers (APPs- Physician Assistants and Nurse Practitioners) who all work together to provide you with the care you need, when you need it.   You may see any of the following providers on your designated Care Team at your next follow up: Dr Toribio Fuel Dr Ezra Shuck Dr. Ria Commander Dr. Morene Brownie Amy Lenetta, NP Caffie Shed, [place] [facility] [place], [place] Beckey Coe, NP [place] Lee, NP Ellouise Class, NP Tinnie Redman, PharmD Jaun Bash, PharmD   Please be sure to bring in all your medications bottles to every appointment.    Thank you for choosing [facility]

## 2024-03-01 NOTE — Progress Notes (Signed)
 ADVANCED HEART FAILURE CLINIC NOTE  Primary Care: Mast, Man X, NP Primary Cardiologist: Dr. Verlin EP: Dr. Fernande HF Cardiologist: Dr. Gardenia  CC: Heart Failure  HPI: Justin [name] is a 89 y.o. male with permanent atrial fibrillation, aortic stenosis s/p TAVR, medtronic BiV ICD in 2018 and after TAVR, CKD, CAD s/p CABG in 2007.  Has followed with Dr. Verlin and Dr. Fernande. S/p ICD generator changed 10/24. EF 40-45% at the time. Enrolled in iCM monitoring.  Admitted 3/25 for pneumonia and compressive atelectasis 2/2 COVID and flu infection c/b UTI . S/p thoracentesis 1L. EF 40-45% with mod reduced RV, normal AoV prosthetic. He was discharged to SNF.   Last visit torsemide  was increased to 80 mg twice a day x 4 days then back to torsemide  80 mg daily and metolazone  weekly. I reviewed Assisted Living medication list and he has been on torsemide  40 mg daily.   Interval hx:  - Today he returns for HF follow up with his daughter. Overall feeling fine. Has had a fall since his last visit. LUE skin tear and RUE skin tear. Mild SOB with exertion. Denies PND/Orthopnea. Appetite ok. No fever or chills. Taking all medications provided Assisted Living.   Current Outpatient Medications  Medication Sig Dispense Refill   albuterol  (VENTOLIN  HFA) 108 (90 Base) MCG/ACT inhaler Inhale 2 puffs into the lungs every 4 (four) hours as needed for wheezing or shortness of breath. 8 g 5   allopurinol  (ZYLOPRIM ) 100 MG tablet Take 1 tablet (100 mg total) by mouth daily. 90 tablet 3   amiodarone  (PACERONE ) 200 MG tablet Take 1 tablet (200 mg total) by mouth daily. 90 tablet 3   aspirin  81 MG tablet Take 1 tablet (81 mg total) by mouth daily. 30 tablet 0   atorvastatin  (LIPITOR) 20 MG tablet TAKE ONE TABLET ONCE DAILY 90 tablet 2   Baclofen 5 MG TABS Take 5 mg by mouth every 12 (twelve) hours as needed.     calcium  carbonate (OS-CAL) 1250 (500 Ca) MG chewable tablet Chew 1 tablet by mouth daily.      Calcium -Magnesium -Zinc-Vit D3 (CALCIUM -MAGNESIUM -ZINC-D3 PO) Take 2 tablets by mouth daily.     cetirizine (ZYRTEC) 10 MG tablet Take 10 mg by mouth 2 (two) times daily. As needed     colchicine  0.6 MG tablet Take 1 tablet (0.6 mg total) by mouth every 6 (six) hours as needed (gout). 60 tablet 5   dabigatran  (PRADAXA ) 150 MG CAPS capsule TAKE (1) CAPSULE TWICE DAILY. 180 capsule 2   diclofenac  Sodium (VOLTAREN ) 1 % GEL Apply 4 g topically 3 (three) times daily as needed (pain). 50 g 0   docusate sodium  (COLACE) 100 MG capsule Take 100 mg by mouth 2 (two) times daily.     fluticasone  (FLONASE ) 50 MCG/ACT nasal spray Place 2 sprays into both nostrils daily.     insulin  aspart (NOVOLOG ) 100 UNIT/ML injection Inject 4-16 Units into the skin as directed. Inject as per sliding scale: if 141 - 180 = 4 units Inject per sliding scale; 181 - 220 = 6 units Inject per sliding scale; 221 - 260 = 8 units Inject per sliding scale; 261 - 300 = 10 units Inject per sliding scale; 301 - 350 = 12 units Inject per sliding scale; 351 - 400= 14 units Inject per sliding scale; 401+ = 16 units Inject per sliding scale, subcutaneously three times a day     ipratropium (ATROVENT ) 0.03 % nasal spray Place 2 sprays into  both nostrils 2 (two) times daily.     isosorbide  mononitrate (IMDUR ) 30 MG 24 hr tablet Take 1 tablet (30 mg total) by mouth daily. 90 tablet 3   ketotifen  (ZADITOR ) 0.025 % ophthalmic solution Place 1 drop into both eyes daily as needed (for itching).     levothyroxine  (SYNTHROID ) 175 MCG tablet Take 175 mcg by mouth daily.     Menthol , Topical Analgesic, (BIOFREEZE COOL THE PAIN) 4 % GEL Apply 4 % topically in the morning, at noon, in the evening, and at bedtime.     metoCLOPramide  (REGLAN ) 10 MG tablet Take 10 mg by mouth 2 (two) times daily.     metolazone  (ZAROXOLYN ) 2.5 MG tablet Take 1 tablet (2.5 mg total) by mouth once a week every Saturday 12 tablet 3   metoprolol  succinate (TOPROL -XL) 50 MG 24 hr  tablet Take 1 tablet (50 mg total) by mouth daily. Take with or immediately following a meal.     montelukast  (SINGULAIR ) 10 MG tablet TAKE ONE TABLET BY MOUTH ONCE DAILY IN THE EVENING 90 tablet 3   Multiple Vitamins-Minerals (MULTI-VITAMIN GUMMIES) CHEW Chew 2 Pieces by mouth daily.     Multiple Vitamins-Minerals (PRESERVISION AREDS 2) CAPS Take 2 capsules by mouth in the morning and at bedtime.     nystatin (MYCOSTATIN/NYSTOP) powder Apply 1 Application topically 2 (two) times daily.     oxyCODONE  (ROXICODONE ) 5 MG immediate release tablet Take 0.5 tablets (2.5 mg total) by mouth every 4 (four) hours as needed for severe pain (pain score 7-10). 30 tablet 0   pantoprazole  (PROTONIX ) 40 MG tablet Take 40 mg by mouth 2 (two) times daily.     polyethylene glycol powder (GLYCOLAX/MIRALAX) 17 GM/SCOOP powder Take 17 g by mouth daily as needed for moderate constipation.     POTASSIUM CHLORIDE  ER PO Take 60 mEq by mouth 2 (two) times daily.     Probiotic Product (PROBIOTIC PO) Take 1 capsule by mouth daily.     sacubitril-valsartan (ENTRESTO ) 24-26 MG Take 0.5 tablets by mouth 2 (two) times daily.     spironolactone  (ALDACTONE ) 25 MG tablet Take 1 tablet (25 mg total) by mouth daily. 30 tablet 9   tamsulosin (FLOMAX) 0.4 MG CAPS capsule Take 0.4 mg by mouth daily.     Tiotropium Bromide Monohydrate  (SPIRIVA  RESPIMAT) 2.5 MCG/ACT AERS Inhale 2 puffs into the lungs daily.     torsemide  (DEMADEX ) 20 MG tablet Take 4 tablets (80 mg total) by mouth daily. Take 80mg  twice daily for 4 days only and then return to 80mg  once daily 120 tablet 3   Torsemide  40 MG TABS Take 80 mg by mouth 2 (two) times daily.     triamcinolone  cream (KENALOG ) 0.1 % Apply 1 application  topically daily as needed for dry skin (affected areas).     valACYclovir  (VALTREX ) 500 MG tablet Take 500 mg by mouth every 12 (twelve) hours as needed (cold sores).     venlafaxine  XR (EFFEXOR  XR) 37.5 MG 24 hr capsule Take 1 capsule (37.5 mg  total) by mouth daily with breakfast. 30 capsule 1   Current Facility-Administered Medications  Medication Dose Route Frequency Provider Last Rate Last Admin   azithromycin  (ZITHROMAX ) tablet 500 mg  500 mg Oral Daily          Wt Readings from Last 3 Encounters:  03/01/24 114.4 kg (252 lb 3.2 oz)  02/17/24 110.2 kg (243 lb)  02/16/24 110.7 kg (244 lb)   BP (!) 90/50  Pulse (!) 107   Wt 114.4 kg (252 lb 3.2 oz)   SpO2 98% Comment: 2l n/c  BMI 40.10 kg/m   PHYSICAL EXAM: Vitals:   03/01/24 1337  BP: (!) 90/50  Pulse: (!) 107  SpO2: 98%   Wt Readings from Last 3 Encounters:  03/01/24 114.4 kg (252 lb 3.2 oz)  02/17/24 110.2 kg (243 lb)  02/16/24 110.7 kg (244 lb)     General:   No resp difficulty. Walked in the clinic with a walker.  Neck: supple. no JVD.  Cor: PMI nondisplaced. Regular rate & rhythm. No rubs, gallops or murmurs. Lungs: clear Abdomen: soft, nontender, nondistended.  Extremities: no cyanosis, clubbing, rash, R and LLE 1 +edema. LUE dressing RUE dressing.  Neuro: alert & oriented x3  DATA REVIEW  Device interrogation- Impedance trending down. Trending above fluid index. Activity 1.3 hours per day   ECHO: 11/12/22: LVEF 45-50%, RV function low normal.  11/18/23: LVEF 40-45%, RV mod reduced, BAE  CATH: LHC/RHC: 08/2016:  There is moderate aortic valve stenosis. SVG graft was visualized by angiography and is normal in caliber. 1st Mrg lesion, 65 %stenosed. Ost LM to LM lesion, 100 %stenosed. SVG graft was visualized by angiography and is normal in caliber. LIMA graft was visualized by angiography and is normal in caliber. Prox RCA to Mid RCA lesion, 100 %stenosed. SVG graft was visualized by angiography and is normal in caliber and anatomically normal. Hemodynamic findings consistent with mild pulmonary hypertension.   1. 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  2. The Left  main is occluded at the ostium.  3. The LAD fills from the patent IMA graft and the Diagonal fills from the patent vein graft 4. The Circumflex fills from the patent vein graft to the OM. There is a moderate stenosis in the proximal segment of the OM branch proximal to the insertion of the vein graft.  5. The distal RCA fills from the patent vein graft.  6. Moderate AS 7. 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  NYHA class / AHA Stage: III, functional class confounded by debility.  Optivol- Fluid index trending back up. Impedance trending down. VP pacing less than 90%, Chronic A fib. Activity ~ 1.3 hours.  Volume status & Diuretics: Picking up again. Increase Torsemide  80 mg daily. Check BMET in 03/09/24 at Assisted Living. Fax results [phone].  Vasodilators: Continue Entresto  12/13 mg bid. Beta-Blocker: Continue Toprol  XL 50 mg daily. MRA: Continue Spironolactone  25 mg daily. Cardiometabolic: Off SGLT2i with UTIs. Devices therapies & Valvulopathies: s/p CRT-D, interrogation as above. Advanced therapies: not a candidate  2. Permanent Atrial fibrillation & frequent PVCs  - Follow with EP, enrolled in iCM. -Rate controlled.  - Continue amio 200 mg daily. - Continue Pradaxa  150 mg bid. - No significant change  3. CAD s/p CABG  - CABG in 2007; LHC in 2018. - No chest pain. - Continue ASA. - Continue atorvastatin  20 mg daily.  4. Aortic stenosis s/p TAVR - CRT-D placed due to syncope and TAVR. - Interrogation as above.  5. Hypothyroidism - on synthroid   - Per PCP  6. Dyspnea - Likely multifactorial. - PFTs have been ordered    New Recommendations  Torsemide  80 mg daily. Check BMET  in 03/09/24 at Assisted Living. Fax result [phone]  Follow up in 2-3 weeks with APP     Greig Mosses, NP 03/01/24

## 2024-03-02 ENCOUNTER — Ambulatory Visit: Payer: Medicare Other

## 2024-03-02 DIAGNOSIS — I255 Ischemic cardiomyopathy: Secondary | ICD-10-CM | POA: Diagnosis not present

## 2024-03-04 LAB — CUP PACEART REMOTE DEVICE CHECK
Battery Remaining Longevity: 79 mo
Battery Voltage: 2.97 V
Brady Statistic RA Percent Paced: 0.18 %
Brady Statistic RV Percent Paced: 68.16 %
Date Time Interrogation Session: 20250708060719
HighPow Impedance: 77 Ohm
Implantable Lead Connection Status: 753985
Implantable Lead Connection Status: 753985
Implantable Lead Connection Status: 753985
Implantable Lead Implant Date: 20180907
Implantable Lead Implant Date: 20180907
Implantable Lead Implant Date: 20180907
Implantable Lead Location: 753858
Implantable Lead Location: 753859
Implantable Lead Location: 753860
Implantable Lead Model: 4396
Implantable Lead Model: 5076
Implantable Pulse Generator Implant Date: 20241004
Lead Channel Impedance Value: 1026 Ohm
Lead Channel Impedance Value: 304 Ohm
Lead Channel Impedance Value: 342 Ohm
Lead Channel Impedance Value: 380 Ohm
Lead Channel Impedance Value: 494 Ohm
Lead Channel Impedance Value: 589 Ohm
Lead Channel Pacing Threshold Amplitude: 1.125 V
Lead Channel Pacing Threshold Amplitude: 2.125 V
Lead Channel Pacing Threshold Pulse Width: 0.4 ms
Lead Channel Pacing Threshold Pulse Width: 0.4 ms
Lead Channel Sensing Intrinsic Amplitude: 0.4 mV
Lead Channel Sensing Intrinsic Amplitude: 12 mV
Lead Channel Setting Pacing Amplitude: 1.75 V
Lead Channel Setting Pacing Amplitude: 2 V
Lead Channel Setting Pacing Amplitude: 3 V
Lead Channel Setting Pacing Pulse Width: 0.4 ms
Lead Channel Setting Pacing Pulse Width: 0.4 ms
Lead Channel Setting Sensing Sensitivity: 0.3 mV
Zone Setting Status: 755011
Zone Setting Status: 755011

## 2024-03-08 ENCOUNTER — Ambulatory Visit: Admitting: Orthopedic Surgery

## 2024-03-08 ENCOUNTER — Ambulatory Visit: Attending: Cardiology

## 2024-03-08 DIAGNOSIS — I5042 Chronic combined systolic (congestive) and diastolic (congestive) heart failure: Secondary | ICD-10-CM

## 2024-03-08 DIAGNOSIS — R2681 Unsteadiness on feet: Secondary | ICD-10-CM | POA: Diagnosis not present

## 2024-03-08 DIAGNOSIS — Z95810 Presence of automatic (implantable) cardiac defibrillator: Secondary | ICD-10-CM

## 2024-03-08 DIAGNOSIS — M6281 Muscle weakness (generalized): Secondary | ICD-10-CM | POA: Diagnosis not present

## 2024-03-09 DIAGNOSIS — N1831 Chronic kidney disease, stage 3a: Secondary | ICD-10-CM | POA: Diagnosis not present

## 2024-03-12 DIAGNOSIS — M6281 Muscle weakness (generalized): Secondary | ICD-10-CM | POA: Diagnosis not present

## 2024-03-12 DIAGNOSIS — R2681 Unsteadiness on feet: Secondary | ICD-10-CM | POA: Diagnosis not present

## 2024-03-12 NOTE — Progress Notes (Signed)
 EPIC Encounter for ICM Monitoring  Patient Name: Justin [name] is a 89 y.o. male Date: 03/12/2024 Primary Care Physican: Mast, Man X, NP Primary Cardiologist: McAlhany/Sabharwal  Electrophysiologist:  [name] Pore Pacing: 72.9%  09/27/2022 Weight: 270 lbs 02/12/2023 Weight: 261 lbs 07/07/2023 Office Weight: 261 lbs 10/07/2023 Weight: 245-250 lbs 10/24/2023 Office Weight: 252 lbs 03/01/2024 Office Weight: 252 lbs   Time in AT/AF  24.0 hr/day (100.0%)   Transmission results reviewed.   Optivol thoracic impedance suggesting possible fluid accumulation starting 6/15 and returned close to normal 7/14.   Prescribed: Torsemide  20 mg take 4 tablets (80 mg total) by mouth daily    Potassium 20 mEq take 3 tablet(s) (60 mEq total) by mouth twice a day.     Metolazone  2.5 mg Take 1 tablet by mouth once a week every Saturday (per 7/15 phone note should take extra 40 mEq of Potassium with Metolazone ).   Spironolactone  25 mg take 1 tablet daily   Labs: 02/19/2024 Creatinine 1.21, BUN 29, Potassium 4.3, Sodium 132, GFR 58 01/29/2024 Creatinine 1.25, BUN 20, Potassium 4.8, Sodium 135, GFR 56, BNP 454 01/20/2024 Creatinine 1.54, BUN 32, Potassium 3.4, Sodium 129, GFR 43  01/16/2024 BNP 823.5 12/23/2023 Creatinine 1.58, BUN 20, Potassium 3.7, Sodium 132, GFR 42, BNP 579.7 12/16/2023 Creatinine 1.3,   BUN 25, Potassium 3.9, Sodium 134  11/27/2023 Creatinine 1.32, BUN 46, Potassium 3.8, Sodium 134, GFR 52 A complete set of results can be found in Results Review.   Recommendations:  No changes.     Follow-up plan: ICM clinic phone appointment on 04/12/2024.   91 day device clinic remote transmission 06/01/2024.     EP/Cardiology Office Visits:    03/17/2024 with HF clinic.   Recall 04/21/2024 with Dr Fernande.      Copy of ICM check sent to Dr [name].   3 month ICM trend: 03/08/2024.    12-14 Month ICM trend:     [name], RN 03/12/2024 12:39 PM

## 2024-03-14 ENCOUNTER — Ambulatory Visit: Payer: Self-pay | Admitting: Cardiology

## 2024-03-15 ENCOUNTER — Other Ambulatory Visit (HOSPITAL_COMMUNITY): Payer: Self-pay | Admitting: Cardiology

## 2024-03-16 DIAGNOSIS — I10 Essential (primary) hypertension: Secondary | ICD-10-CM | POA: Diagnosis not present

## 2024-03-16 DIAGNOSIS — H548 Legal blindness, as defined in USA: Secondary | ICD-10-CM | POA: Diagnosis not present

## 2024-03-16 DIAGNOSIS — H353134 Nonexudative age-related macular degeneration, bilateral, advanced atrophic with subfoveal involvement: Secondary | ICD-10-CM | POA: Diagnosis not present

## 2024-03-16 DIAGNOSIS — Z961 Presence of intraocular lens: Secondary | ICD-10-CM | POA: Diagnosis not present

## 2024-03-16 LAB — HEPATIC FUNCTION PANEL
ALT: 15 U/L (ref 10–40)
AST: 26 (ref 14–40)
Alkaline Phosphatase: 155 — AB (ref 25–125)
Bilirubin, Direct: 0.2 (ref 0.01–0.4)
Bilirubin, Total: 0.8

## 2024-03-16 LAB — COMPREHENSIVE METABOLIC PANEL WITH GFR
Albumin: 3 — AB (ref 3.5–5.0)
Globulin: 3.7

## 2024-03-16 LAB — TSH: TSH: 2.24 (ref 0.41–5.90)

## 2024-03-17 ENCOUNTER — Ambulatory Visit (HOSPITAL_COMMUNITY)
Admission: RE | Admit: 2024-03-17 | Discharge: 2024-03-17 | Disposition: A | Discharge status: Home / Self Care | Source: Ambulatory Visit | Attending: Family Medicine | Admitting: Family Medicine

## 2024-03-17 ENCOUNTER — Encounter (HOSPITAL_COMMUNITY): Payer: Self-pay

## 2024-03-17 ENCOUNTER — Ambulatory Visit: Admitting: Orthopedic Surgery

## 2024-03-17 VITALS — BP 110/62 | HR 96 | Ht 66.5 in | Wt 253.8 lb

## 2024-03-17 DIAGNOSIS — Z7901 Long term (current) use of anticoagulants: Secondary | ICD-10-CM | POA: Diagnosis not present

## 2024-03-17 DIAGNOSIS — E039 Hypothyroidism, unspecified: Secondary | ICD-10-CM | POA: Diagnosis not present

## 2024-03-17 DIAGNOSIS — Z7982 Long term (current) use of aspirin: Secondary | ICD-10-CM | POA: Insufficient documentation

## 2024-03-17 DIAGNOSIS — Z79899 Other long term (current) drug therapy: Secondary | ICD-10-CM | POA: Diagnosis not present

## 2024-03-17 DIAGNOSIS — I2510 Atherosclerotic heart disease of native coronary artery without angina pectoris: Secondary | ICD-10-CM | POA: Diagnosis not present

## 2024-03-17 DIAGNOSIS — Z951 Presence of aortocoronary bypass graft: Secondary | ICD-10-CM | POA: Insufficient documentation

## 2024-03-17 DIAGNOSIS — Z8616 Personal history of COVID-19: Secondary | ICD-10-CM | POA: Insufficient documentation

## 2024-03-17 DIAGNOSIS — I350 Nonrheumatic aortic (valve) stenosis: Secondary | ICD-10-CM | POA: Insufficient documentation

## 2024-03-17 DIAGNOSIS — R0600 Dyspnea, unspecified: Secondary | ICD-10-CM

## 2024-03-17 DIAGNOSIS — Z95810 Presence of automatic (implantable) cardiac defibrillator: Secondary | ICD-10-CM | POA: Insufficient documentation

## 2024-03-17 DIAGNOSIS — Z794 Long term (current) use of insulin: Secondary | ICD-10-CM | POA: Diagnosis not present

## 2024-03-17 DIAGNOSIS — N189 Chronic kidney disease, unspecified: Secondary | ICD-10-CM | POA: Insufficient documentation

## 2024-03-17 DIAGNOSIS — Z952 Presence of prosthetic heart valve: Secondary | ICD-10-CM | POA: Insufficient documentation

## 2024-03-17 DIAGNOSIS — H903 Sensorineural hearing loss, bilateral: Secondary | ICD-10-CM | POA: Diagnosis not present

## 2024-03-17 DIAGNOSIS — Z79890 Hormone replacement therapy (postmenopausal): Secondary | ICD-10-CM | POA: Diagnosis not present

## 2024-03-17 DIAGNOSIS — Z9981 Dependence on supplemental oxygen: Secondary | ICD-10-CM | POA: Insufficient documentation

## 2024-03-17 DIAGNOSIS — I4821 Permanent atrial fibrillation: Secondary | ICD-10-CM | POA: Diagnosis not present

## 2024-03-17 DIAGNOSIS — I5022 Chronic systolic (congestive) heart failure: Secondary | ICD-10-CM | POA: Insufficient documentation

## 2024-03-17 DIAGNOSIS — I493 Ventricular premature depolarization: Secondary | ICD-10-CM | POA: Insufficient documentation

## 2024-03-17 MED ORDER — POTASSIUM CHLORIDE ER 20 MEQ PO TBCR: 80.0000 meq | EXTENDED_RELEASE_TABLET | Freq: Two times a day (BID) | ORAL | Status: DC

## 2024-03-17 MED ORDER — TORSEMIDE 20 MG PO TABS: ORAL_TABLET | ORAL | 3 refills | Status: DC

## 2024-03-17 NOTE — Patient Instructions (Addendum)
 Good to see you today!  INCREASE torsemide  to 80 mg in the am and 40 mg in the evening  INCREASE potassium  to 80 meq Twice daily  Labs done today, your results will be available in MyChart, we will contact you for abnormal readings.  REPEAT lab work in a week  Your physician recommends that you schedule a follow-up appointment as scheduled   Please follow up with electrophysiology for  visit  If you have any questions or concerns before your next appointment please send us  a message through [place] or call our office at [phone].    TO LEAVE A MESSAGE FOR THE NURSE SELECT OPTION 2, PLEASE LEAVE A MESSAGE INCLUDING: YOUR NAME DATE OF BIRTH CALL BACK NUMBER REASON FOR CALL**this is important as we prioritize the call backs  YOU WILL RECEIVE A CALL BACK THE SAME DAY AS LONG AS YOU CALL BEFORE 4:00 PM At the [facility], you and your health needs are our priority. As part of our continuing mission to provide you with exceptional heart care, we have created designated Provider Care Teams. These Care Teams include your primary Cardiologist (physician) and Advanced Practice Providers (APPs- Physician Assistants and Nurse Practitioners) who all work together to provide you with the care you need, when you need it.   You may see any of the following providers on your designated Care Team at your next follow up: Dr Toribio Fuel Dr Ezra Shuck Dr. Ria Commander Dr. Morene Brownie Amy Lenetta, NP Caffie Shed, [place] [facility] [place], [place] Beckey Coe, NP [place] Lee, NP Ellouise Class, NP Tinnie Redman, PharmD Jaun Bash, PharmD   Please be sure to bring in all your medications bottles to every appointment.    Thank you for choosing [facility]

## 2024-03-17 NOTE — Progress Notes (Signed)
 ADVANCED HEART FAILURE CLINIC NOTE  Primary Care: Mast, Man X, NP Primary Cardiologist: Dr. Verlin EP: Dr. Fernande HF Cardiologist: Dr. Gardenia  HPI: Justin [name] is a 89 y.o. male with permanent atrial fibrillation, aortic stenosis s/p TAVR, medtronic BiV ICD in 2018 and after TAVR, CKD, CAD s/p CABG in 2007.  Has followed with Dr. Verlin and Dr. Fernande. S/p ICD generator changed 10/24. EF 40-45% at the time. Enrolled in iCM monitoring.  Admitted 3/25 for pneumonia and compressive atelectasis 2/2 COVID and flu infection c/b UTI. S/p thoracentesis 1L. EF 40-45% with mod reduced RV, normal AoV prosthetic. He was discharged to SNF.   Interval hx:  Today he returns for HF follow up with his daughter. Overall feeling fine. Feels fluid is down. Wears CPAP at night, wears 4L oxygen when walking. He is not SOB walking on flat ground. [place]elling has improved, urinates briskly on Saturdays when he is given metolazone . Denies palpitations, abnormal bleeding, CP, dizziness, edema, or PND/Orthopnea. Appetite ok. Last weight at facility was 252 lbs. Taking all medications provided Assisted Living.   Current Outpatient Medications  Medication Sig Dispense Refill   albuterol  (VENTOLIN  HFA) 108 (90 Base) MCG/ACT inhaler Inhale 2 puffs into the lungs every 4 (four) hours as needed for wheezing or shortness of breath. 8 g 5   allopurinol  (ZYLOPRIM ) 100 MG tablet Take 1 tablet (100 mg total) by mouth daily. 90 tablet 3   amiodarone  (PACERONE ) 200 MG tablet Take 1 tablet (200 mg total) by mouth daily. 90 tablet 3   aspirin  81 MG tablet Take 1 tablet (81 mg total) by mouth daily. 30 tablet 0   atorvastatin  (LIPITOR) 20 MG tablet TAKE ONE TABLET ONCE DAILY 90 tablet 2   Baclofen 5 MG TABS Take 5 mg by mouth every 12 (twelve) hours as needed.     calcium  carbonate (OS-CAL) 1250 (500 Ca) MG chewable tablet Chew 1 tablet by mouth daily.     Calcium -Magnesium -Zinc-Vit D3 (CALCIUM -MAGNESIUM -ZINC-D3 PO) Take 2  tablets by mouth daily.     cetirizine (ZYRTEC) 10 MG tablet Take 10 mg by mouth 2 (two) times daily. As needed     colchicine  0.6 MG tablet Take 1 tablet (0.6 mg total) by mouth every 6 (six) hours as needed (gout). 60 tablet 5   dabigatran  (PRADAXA ) 150 MG CAPS capsule TAKE (1) CAPSULE TWICE DAILY. 180 capsule 2   diclofenac  Sodium (VOLTAREN ) 1 % GEL Apply 4 g topically 3 (three) times daily as needed (pain). 50 g 0   docusate sodium  (COLACE) 100 MG capsule Take 100 mg by mouth 2 (two) times daily.     fluticasone  (FLONASE ) 50 MCG/ACT nasal spray Place 2 sprays into both nostrils daily.     insulin  aspart (NOVOLOG ) 100 UNIT/ML injection Inject 4-16 Units into the skin as directed. Inject as per sliding scale: if 141 - 180 = 4 units Inject per sliding scale; 181 - 220 = 6 units Inject per sliding scale; 221 - 260 = 8 units Inject per sliding scale; 261 - 300 = 10 units Inject per sliding scale; 301 - 350 = 12 units Inject per sliding scale; 351 - 400= 14 units Inject per sliding scale; 401+ = 16 units Inject per sliding scale, subcutaneously three times a day     ipratropium (ATROVENT ) 0.03 % nasal spray Place 2 sprays into both nostrils 2 (two) times daily.     isosorbide  mononitrate (IMDUR ) 30 MG 24 hr tablet Take 1 tablet (30 mg  total) by mouth daily. 90 tablet 3   ketotifen  (ZADITOR ) 0.025 % ophthalmic solution Place 1 drop into both eyes daily as needed (for itching).     levothyroxine  (SYNTHROID ) 175 MCG tablet Take 175 mcg by mouth daily.     Menthol , Topical Analgesic, (BIOFREEZE COOL THE PAIN) 4 % GEL Apply 4 % topically in the morning, at noon, in the evening, and at bedtime.     metoCLOPramide  (REGLAN ) 10 MG tablet Take 10 mg by mouth 2 (two) times daily.     metolazone  (ZAROXOLYN ) 2.5 MG tablet Take 1 tablet (2.5 mg total) by mouth once a week every Saturday 12 tablet 3   metoprolol  succinate (TOPROL -XL) 50 MG 24 hr tablet Take 1 tablet (50 mg total) by mouth daily. Take with or  immediately following a meal.     montelukast  (SINGULAIR ) 10 MG tablet TAKE ONE TABLET BY MOUTH ONCE DAILY IN THE EVENING 90 tablet 3   Multiple Vitamins-Minerals (MULTI-VITAMIN GUMMIES) CHEW Chew 2 Pieces by mouth daily.     Multiple Vitamins-Minerals (PRESERVISION AREDS 2) CAPS Take 2 capsules by mouth in the morning and at bedtime.     nystatin (MYCOSTATIN/NYSTOP) powder Apply 1 Application topically 2 (two) times daily.     pantoprazole  (PROTONIX ) 40 MG tablet Take 40 mg by mouth 2 (two) times daily.     polyethylene glycol powder (GLYCOLAX/MIRALAX) 17 GM/SCOOP powder Take 17 g by mouth daily as needed for moderate constipation.     POTASSIUM CHLORIDE  ER PO Take 60 mEq by mouth 2 (two) times daily.     Probiotic Product (PROBIOTIC PO) Take 1 capsule by mouth daily.     sacubitril-valsartan (ENTRESTO ) 24-26 MG Take 0.5 tablets by mouth 2 (two) times daily.     spironolactone  (ALDACTONE ) 25 MG tablet Take 1 tablet (25 mg total) by mouth daily. 30 tablet 9   tamsulosin (FLOMAX) 0.4 MG CAPS capsule Take 0.4 mg by mouth daily.     Tiotropium Bromide Monohydrate  (SPIRIVA  RESPIMAT) 2.5 MCG/ACT AERS Inhale 2 puffs into the lungs daily.     torsemide  (DEMADEX ) 20 MG tablet Take 4 tablets (80 mg total) by mouth daily. Take 80mg  twice daily for 4 days only and then return to 80mg  once daily 120 tablet 3   triamcinolone  cream (KENALOG ) 0.1 % Apply 1 application  topically daily as needed for dry skin (affected areas).     valACYclovir  (VALTREX ) 500 MG tablet Take 500 mg by mouth every 12 (twelve) hours as needed (cold sores).     venlafaxine  XR (EFFEXOR  XR) 37.5 MG 24 hr capsule Take 1 capsule (37.5 mg total) by mouth daily with breakfast. 30 capsule 1   oxyCODONE  (ROXICODONE ) 5 MG immediate release tablet Take 0.5 tablets (2.5 mg total) by mouth every 4 (four) hours as needed for severe pain (pain score 7-10). (Patient not taking: Reported on 03/17/2024) 30 tablet 0   Current Facility-Administered  Medications  Medication Dose Route Frequency Provider Last Rate Last Admin   azithromycin  (ZITHROMAX ) tablet 500 mg  500 mg Oral Daily        Wt Readings from Last 3 Encounters:  03/17/24 115.1 kg (253 lb 12.8 oz)  03/01/24 114.4 kg (252 lb 3.2 oz)  02/17/24 110.2 kg (243 lb)   BP 110/62   Pulse 96   Ht 5' 6.5 (1.689 m)   Wt 115.1 kg (253 lb 12.8 oz)   SpO2 97%   BMI 40.35 kg/m   PHYSICAL EXAM: General:  NAD. No  resp difficulty, walked into clinic, elderly HEENT: Normal Neck: Supple. JVP 10 Cor: irregular rate & rhythm. No rubs, gallops or murmurs. Lungs: Clear Abdomen: Soft, obese, nontender, nondistended.  Extremities: No cyanosis, clubbing, rash, trace LE edema Neuro: Alert & oriented x 3, moves all 4 extremities w/o difficulty. Affect pleasant.  Device interrogation (personally reviewed): OptiVol up, thoracic impedence down, 100% AF, 1.5 hr/day activity, 69.5% BiV pacing  DATA REVIEW  ECHO: 11/12/22: LVEF 45-50%, RV function low normal.  11/18/23: LVEF 40-45%, RV mod reduced, BAE  CATH: LHC/RHC: 08/2016:  There is moderate aortic valve stenosis. SVG graft was visualized by angiography and is normal in caliber. 1st Mrg lesion, 65 %stenosed. Ost LM to LM lesion, 100 %stenosed. SVG graft was visualized by angiography and is normal in caliber. LIMA graft was visualized by angiography and is normal in caliber. Prox RCA to Mid RCA lesion, 100 %stenosed. SVG graft was visualized by angiography and is normal in caliber and anatomically normal. Hemodynamic findings consistent with mild pulmonary hypertension.   1. 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  2. The Left main is occluded at the ostium.  3. The LAD fills from the patent IMA graft and the Diagonal fills from the patent vein graft 4. The Circumflex fills from the patent vein graft to the OM. There is a moderate stenosis in the proximal segment  of the OM branch proximal to the insertion of the vein graft.  5. The distal RCA fills from the patent vein graft.  6. Moderate AS 7. 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  NYHA class / AHA Stage: II-III, functional class confounded by debility.  Volume status & Diuretics: Volume up on device, weight & symptoms stable. - Increase torsemide  to 80 mg q am and 40 mg q pm. - Increase KCL to 80 bid. - Continue metolazone  2.5 mg/extra 40 KCL on Saturdays Vasodilators: Continue Entresto  12/13 mg bid. Beta-Blocker: Continue Toprol  XL 50 mg daily. MRA: Continue spironolactone  25 mg daily. Cardiometabolic: Off SGLT2i with UTIs. Devices therapies & Valvulopathies: s/p CRT-D, interrogation as above. Advanced therapies: not a candidate Reviewed labs from 03/09/24: SCr 1.43 K 3.5 - BiV % down, will send message to EP - Repeat BMET in 1 week at facility.  2. Permanent Atrial fibrillation & frequent PVCs  - Follow with EP, enrolled in iCM. - Rate controlled.  - Continue amio 200 mg daily. - Continue Pradaxa  150 mg bid. - No significant change.  3. CAD s/p CABG  - CABG in 2007 - LHC in 2018. - No chest pain. - Continue ASA. - Continue atorvastatin  20 mg daily.  4. Aortic stenosis  - s/p TAVR - CRT-D placed due to syncope and TAVR. - Interrogation as above.  5. Hypothyroidism - on synthroid   - Per PCP  6. Dyspnea - Likely multifactorial. - Continue home oxygen - PFTs suggest interstitial process with mild diffusion defect; DLCO 60% pred  Follow up in 6 weeks with APP.  [name] [place], FNP 03/17/24

## 2024-03-18 ENCOUNTER — Telehealth (HOSPITAL_COMMUNITY): Payer: Self-pay

## 2024-03-18 DIAGNOSIS — R2681 Unsteadiness on feet: Secondary | ICD-10-CM | POA: Diagnosis not present

## 2024-03-18 DIAGNOSIS — M6281 Muscle weakness (generalized): Secondary | ICD-10-CM | POA: Diagnosis not present

## 2024-03-18 MED ORDER — TORSEMIDE 20 MG PO TABS: ORAL_TABLET | ORAL | 3 refills | Status: DC

## 2024-03-18 NOTE — Telephone Encounter (Signed)
 [facility] called to confirm patients dosing of torsemide  and potassium from yesterday's visit.   Advised them of the following   -Increase torsemide  to 80 mg q am and 40 mg q pm. - Increase KCL to 80 bid.  Nurse verbalized understanding--ensured medication list reflected changes.

## 2024-03-19 DIAGNOSIS — M6281 Muscle weakness (generalized): Secondary | ICD-10-CM | POA: Diagnosis not present

## 2024-03-19 DIAGNOSIS — R2681 Unsteadiness on feet: Secondary | ICD-10-CM | POA: Diagnosis not present

## 2024-03-22 DIAGNOSIS — M6281 Muscle weakness (generalized): Secondary | ICD-10-CM | POA: Diagnosis not present

## 2024-03-22 DIAGNOSIS — R2681 Unsteadiness on feet: Secondary | ICD-10-CM | POA: Diagnosis not present

## 2024-03-23 ENCOUNTER — Ambulatory Visit: Attending: Cardiology

## 2024-03-23 DIAGNOSIS — Z95810 Presence of automatic (implantable) cardiac defibrillator: Secondary | ICD-10-CM

## 2024-03-23 DIAGNOSIS — I5042 Chronic combined systolic (congestive) and diastolic (congestive) heart failure: Secondary | ICD-10-CM

## 2024-03-24 NOTE — Progress Notes (Signed)
 Message Received: Today [place], [name], FNP  Joyanna Kleman, Mitzie RAMAN, RN Thanks Mitzie, no changes for now

## 2024-03-24 NOTE — Progress Notes (Signed)
 EPIC Encounter for ICM Monitoring  Patient Name: Justin [name] is a 89 y.o. male Date: 03/24/2024 Primary Care Physican: Mast, Man X, NP Primary Cardiologist: McAlhany/Sabharwal  Electrophysiologist:  [name] Pore Pacing: 65.7%  09/27/2022 Weight: 270 lbs 02/12/2023 Weight: 261 lbs 07/07/2023 Office Weight: 261 lbs 10/07/2023 Weight: 245-250 lbs 10/24/2023 Office Weight: 252 lbs 03/01/2024 Office Weight: 252 lbs 03/17/2024 Office Weight: 253 lbs   Time in AT/AF  24.0 hr/day (100.0%)   Transmission results reviewed.   Optivol thoracic impedance suggesting fluid levels returned to normal 7/27.   Prescribed: Torsemide  20 mg take 4 tablets (80 mg total) by mouth every morning and 2 tablets (40 mg total) every afternoon    Potassium 20 mEq take 4 tablet(s) (80 mEq total) by mouth twice a day.     Metolazone  2.5 mg Take 1 tablet by mouth once a week every Saturday (per 7/15 phone note should take extra 40 mEq of Potassium with Metolazone ).   Spironolactone  25 mg take 1 tablet daily   Labs: 03/09/2024 Creatinine 1.43, BUN 32, Potassium 3.5, Sodium 131, GFR 47 02/19/2024 Creatinine 1.21, BUN 29, Potassium 4.3, Sodium 132, GFR 58 01/29/2024 Creatinine 1.25, BUN 20, Potassium 4.8, Sodium 135, GFR 56, BNP 454 01/20/2024 Creatinine 1.54, BUN 32, Potassium 3.4, Sodium 129, GFR 43  01/16/2024 BNP 823.5 12/23/2023 Creatinine 1.58, BUN 20, Potassium 3.7, Sodium 132, GFR 42, BNP 579.7 12/16/2023 Creatinine 1.3,   BUN 25, Potassium 3.9, Sodium 134  11/27/2023 Creatinine 1.32, BUN 46, Potassium 3.8, Sodium 134, GFR 52 A complete set of results can be found in Results Review.   Recommendations:  No changes.  Copy sent to [name], NP at [facility] clinic following 03/17/2024 OV.     Follow-up plan: ICM clinic phone appointment on 04/12/2024.   91 day device clinic remote transmission 06/01/2024.     EP/Cardiology Office Visits:    04/28/2024 with HF clinic.   Recall 04/21/2024 with Dr Fernande.      Copy of  ICM check sent to Dr [name].   3 month ICM trend: 03/22/2024.    12-14 Month ICM trend:     [name], RN 03/24/2024 1:21 PM

## 2024-03-25 ENCOUNTER — Ambulatory Visit: Admitting: Surgical

## 2024-03-25 ENCOUNTER — Other Ambulatory Visit: Payer: Self-pay

## 2024-03-25 DIAGNOSIS — N1831 Chronic kidney disease, stage 3a: Secondary | ICD-10-CM | POA: Diagnosis not present

## 2024-03-25 DIAGNOSIS — M7051 Other bursitis of knee, right knee: Secondary | ICD-10-CM | POA: Diagnosis not present

## 2024-03-25 DIAGNOSIS — M25561 Pain in right knee: Secondary | ICD-10-CM

## 2024-03-25 LAB — COMPREHENSIVE METABOLIC PANEL WITH GFR
Calcium: 8.1 — AB (ref 8.7–10.7)
eGFR: 47

## 2024-03-25 LAB — BASIC METABOLIC PANEL WITH GFR
BUN: 31 — AB (ref 4–21)
CO2: 27 — AB (ref 13–22)
Chloride: 96 — AB (ref 99–108)
Creatinine: 1.5 — AB (ref 0.6–1.3)
Glucose: 96
Potassium: 4 meq/L (ref 3.5–5.1)
Sodium: 132 — AB (ref 137–147)

## 2024-03-26 ENCOUNTER — Encounter: Payer: Self-pay | Admitting: Surgical

## 2024-03-26 MED ORDER — LIDOCAINE HCL 1 % IJ SOLN
5.0000 mL | INTRAMUSCULAR | Status: AC | PRN
  Administered 2024-03-25: 5 mL

## 2024-03-26 MED ORDER — BUPIVACAINE HCL 0.25 % IJ SOLN
1.0000 mL | INTRAMUSCULAR | Status: AC | PRN
  Administered 2024-03-25: 1 mL via INTRA_ARTICULAR

## 2024-03-26 NOTE — Progress Notes (Signed)
 Follow-up Office Visit Note   Patient: Justin [name]           Date of Birth: 02-Sep-1933           MRN: [id number] Visit Date: 03/25/2024 Requested by: Mast, Man X, NP [address] [place],  [place] [address] PCP: Mast, Man X, NP  Subjective: Chief Complaint  Patient presents with   Right Knee - Pain   Left Knee - Pain    HPI: Justin [name] is a 89 y.o. male who returns to the office for follow-up visit.    Plan at last visit was: Impression is left knee pain which is primarily patellofemoral in origin. No instability and radiographs are on revealing. Need CT scan to evaluate the patella. 1 make sure there is no loosening. Unlike his right knee problems 5 years ago there is no effusion in the left knee today. Will see what the CT scan shows.   Since then, patient notes his left knee is not causing any discomfort aside from clicking sensation rarely.  His right knee is causing the most pain for him and he describes pain on the lateral aspect of the knee.  States that pain is worse with getting up from a seated position.  No pain at rest.  He has difficulty laying on his lateral knee at night.  Ambulates with rolling walker.  Does not take any medications for pain.  Had CT scans demonstrating no fracture or any abnormality regarding implants of either knee.              ROS: All systems reviewed are negative as they relate to the chief complaint within the history of present illness.  Patient denies fevers or chills.  Assessment & Plan: Visit Diagnoses:  1. Right knee pain, unspecified chronicity     Plan: Justin [name] is a 89 y.o. male who returns to the office for follow-up visit.  Plan from last visit was noted above in HPI.  They now return with primarily right knee pain.  Localizes laterally around Gerdy's tubercle.  Ultrasound exam demonstrates trace effusion in the right knee with no significant distention of the IT bursa.  No abnormality regarding the extensor  mechanism.  We discussed options to patient.  He would like to try at the bursa injection.  This was administered after sterile prepping with alcohol, Betadine , ChloraPrep.  Tolerated injection of 1 cc Toradol  mixed with 1 cc Marcaine  well.  He did have near 100% symptomatic improvement after procedure upon walking out of the clinic.  Recommended he follow-up with the office as needed if symptoms return.  Follow-Up Instructions: Return if symptoms worsen or fail to improve.   Orders:  Orders Placed This Encounter  Procedures   US  Guided Needle Placement - No Linked Charges   No orders of the defined types were placed in this encounter.     Procedures: Right knee IT bursa injection on 03/25/2024 11:14 AM Indications: diagnostic evaluation, joint swelling and pain Details: 22 G 1.5 in needle, ultrasound-guided lateral approach  Arthrogram: No  Medications: 5 mL lidocaine  1 %; 1 mL bupivacaine  0.25 % Outcome: tolerated well, no immediate complications Procedure, treatment alternatives, risks and benefits explained, specific risks discussed. Consent was given by the patient. Immediately prior to procedure a time out was called to verify the correct patient, procedure, equipment, support staff and site/side marked as required. Patient was prepped and draped in the usual sterile fashion.  Clinical Data: No additional findings.  Objective: Vital Signs: There were no vitals taken for this visit.  Physical Exam:  Constitutional: Patient appears well-developed HEENT:  Head: Normocephalic Eyes:EOM are normal Neck: Normal range of motion Cardiovascular: Normal rate Pulmonary/chest: Effort normal Neurologic: Patient is alert Skin: Skin is warm Psychiatric: Patient has normal mood and affect  Ortho Exam: Ortho exam demonstrates right knee with no significant palpable effusion.  He has incision that is well-healed.  No cellulitis or skin changes.  There is no increased warmth.  No  calf tenderness.  Negative Homans' sign.  Able to perform straight leg raise without extensor lag.  Stable to varus and valgus stress.  No gross mid flexion instability compared to the contralateral side.  Has tenderness primarily over Gerdy's tubercle.  No pain with hip range of motion.  Negative Stinchfield sign.  Negative FADIR sign.  Specialty Comments:  No specialty comments available.  Imaging: US  Guided Needle Placement - No Linked Charges Result Date: 03/26/2024 Ultrasound imaging demonstrates needle placement deep to the iliotibial band with injection of fluid and no complication    PMFS History: Patient Active Problem List   Diagnosis Date Noted   Hyponatremia 02/16/2024   UTI (urinary tract infection) 12/19/2023   Recurrent depression (HCC) 12/19/2023   Candidiasis 12/15/2023   Pleural effusion 11/20/2023   Asymptomatic bacteriuria 11/20/2023   Viral pneumonia 11/19/2023   Acute hypoxemic respiratory failure (HCC) 11/18/2023   Coronavirus infection 11/18/2023   Acute respiratory failure with hypoxia (HCC) 11/17/2023   Bronchitis 11/06/2023   Type 2 diabetes mellitus (HCC) 10/23/2023   Leukocytosis 10/16/2023   MDD (major depressive disorder) 10/09/2023   Permanent atrial fibrillation (HCC) 09/01/2023   Right ventricular dysfunction 07/05/2023   Biventricular cardiac pacemaker in situ 07/05/2023   Chronic kidney disease, stage 3a (HCC) 07/05/2023   Persistent atrial fibrillation (HCC) 07/05/2023   Long term (current) use of anticoagulants 07/05/2023   Hx of CABG 07/05/2023   Atherosclerosis of native coronary artery of native heart without angina pectoris 07/05/2023   Acute exacerbation of congestive heart failure (HCC) 06/30/2023   NSVT (nonsustained ventricular tachycardia) (HCC) 05/23/2022   Sinus node dysfunction (HCC) 05/23/2022   Frequent PVCs 05/23/2022   Acute on chronic combined systolic and diastolic heart failure (HCC) 03/12/2022   Biventricular  implantable cardioverter-defibrillator (ICD) in situ 03/12/2022   Atypical atrial flutter (HCC) 01/30/2022   Paroxysmal atrial fibrillation (HCC)    Secondary hypercoagulable state (HCC) 04/19/2021   Raynaud's phenomenon without gangrene 07/11/2020   Vertigo 07/11/2020   Loosening of knee joint prosthesis (HCC) 05/18/2019   Cough 04/30/2019   Gastroesophageal reflux disease 04/30/2019   S/P ICD (internal cardiac defibrillator) procedure 05/03/2017   NICM (nonischemic cardiomyopathy) (HCC) 05/02/2017   History of transcatheter aortic valve replacement (TAVR) 03/04/2017   Severe aortic valve stenosis 03/04/2017   Hypokalemia due to loss of potassium with diuresis 01/10/2017   Aortic stenosis 01/10/2017   Chronic systolic CHF (congestive heart failure) (HCC) 01/10/2017   Chronic congestive heart failure (HCC) 01/07/2017   Hypothyroidism 12/23/2016   Prostate cancer (HCC)    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   Edema of both legs 09/01/2014   Asthma, intermittent 09/01/2014   Epiphora due to insufficient drainage 07/02/2011   Rosacea blepharoconjunctivitis 07/02/2011   Coronary artery disease involving native coronary artery of native heart without angina pectoris    GOUT 09/12/2009   CAD, AUTOLOGOUS BYPASS GRAFT 05/10/2009   MURMUR 05/08/2009   BRUIT 05/08/2009   MUSCLE CRAMPS  12/12/2008   KNEE SPRAIN 09/21/2008   Osteoarthritis 07/07/2008   BPH with urinary obstruction 02/01/2008   LOW BACK PAIN SYNDROME 02/01/2008   TESTOSTERONE DEFICIENCY 07/03/2007   Hyperlipidemia 07/03/2007   Essential hypertension 07/03/2007   MYOCARDIAL INFARCTION, HX OF 07/03/2007   Allergic rhinitis 07/03/2007   History of colonic polyps 07/03/2007   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   Past Medical History:  Diagnosis Date   Age-related macular degeneration, dry, both eyes    Allergic    24/7; 365 days/year; I'm allergic to pollens, dust, all southern grasses/trees, mold, mildue, cats, dogs (01/07/2017)    Anal fissure    Asthma    sees Dr. Alaine    Benign prostatic hypertrophy    (sees Dr. Colan   CAD (coronary artery disease)    a. s/p CABG 2007. b. Cath 08/2016 - 4/4 patent grafts.   Carotid bruit    carotid u/s 10/10: 0.39% bilaterally   Chronic combined systolic and diastolic CHF (congestive heart failure) (HCC)    Complication of anesthesia 1980s   w/anal cyst OR, he gave me a saddle block then put a narcotic in spinal cord; had a severe reaction to that (01/07/2017)   Congestive heart failure (CHF) [facility])    ED (erectile dysfunction)    Family history of adverse reaction to anesthesia    daughter wakes up during OR (01/07/2017)   GERD (gastroesophageal reflux disease)    Gout    HTN (hypertension)    Hx of colonic polyps    (sees Dr. Abran)   Hyperlipidemia    Hypothyroidism    Moderate to severe aortic stenosis    a. s/p TAVR 02/2017.   Myocardial infarction [facility]) ~ 2000   Obesity    Osteoarthritis    was in my knees, hands (01/07/2017 )   PAF (paroxysmal atrial fibrillation) (HCC)    a. documented post TAVR.   Precancerous skin lesion    (sees Dr. Ivin)   Prostate cancer [facility]) dx'd ~ 2014   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   29 mm Edwards Sapien 3 transcatheter heart valve placed via percutaneous right transfemoral approach    Family History  Problem Relation Age of Onset   Heart attack Father 88   Allergic rhinitis Father    Asthma Father    Heart failure Mother 66   Uterine cancer Mother    Breast cancer Mother    Colon cancer Neg Hx    Esophageal cancer Neg Hx     Past Surgical History:  Procedure Laterality Date   ANUS SURGERY     opened it back up cause it wouldn't heal; wound up w/a fissure (01/07/2017)   BIV ICD INSERTION CRT-D N/A 05/02/2017   Procedure: BIV ICD INSERTION CRT-D;  Surgeon: [name], MD;  Location: [facility] INVASIVE CV LAB;  Service: Cardiovascular;  Laterality: N/A;    CARDIAC CATHETERIZATION  10/29/2005   CARDIAC CATHETERIZATION N/A 08/27/2016   Procedure: Right/Left Heart Cath and Coronary/Graft Angiography;  Surgeon: Lonni JONETTA Cash, MD;  Location: [facility] INVASIVE CV LAB;  Service: Cardiovascular;  Laterality: N/A;   CARDIOVERSION N/A 04/24/2021   Procedure: CARDIOVERSION;  Surgeon: Okey Vina GAILS, MD;  Location: [facility] ENDOSCOPY;  Service: Cardiovascular;  Laterality: N/A;   CARDIOVERSION N/A 11/08/2021   Procedure: CARDIOVERSION;  Surgeon: Mona Vinie BROCKS, MD;  Location: [facility] ENDOSCOPY;  Service: Cardiovascular;  Laterality: N/A;   CARDIOVERSION N/A 02/06/2022   Procedure: CARDIOVERSION;  Surgeon: Delford Maude BROCKS, MD;  Location: [facility] ENDOSCOPY;  Service: Cardiovascular;  Laterality: N/A;   CATARACT EXTRACTION W/ INTRAOCULAR LENS  IMPLANT, BILATERAL Bilateral    CATARACT EXTRACTION, BILATERAL  2012   COLONOSCOPY  06/30/2008   no repeats needed    COLONOSCOPY     had 3 or 4 in the past    CORONARY ARTERY BYPASS GRAFT  2007   CABG X4   CYST EXCISION PERINEAL  1980s   HAMMER TOE SURGERY Bilateral    ICD GENERATOR CHANGEOUT N/A 05/30/2023   Procedure: ICD GENERATOR CHANGEOUT;  Surgeon: [name], MD;  Location: [facility] INVASIVE CV LAB;  Service: Cardiovascular;  Laterality: N/A;   JOINT REPLACEMENT     KNEE ARTHROPLASTY  07/30/2011   Procedure: COMPUTER ASSISTED TOTAL KNEE ARTHROPLASTY;  Surgeon: Cordella Glendia Hutchinson;  Location: [facility] OR;  Service: Orthopedics;  Laterality: Left;  left total knee arthroplasty   MASTECTOMY SUBCUTANEOUS Bilateral    MULTIPLE TOOTH EXTRACTIONS     ORIF FINGER / THUMB FRACTURE Right ~ 1980   repair of thumb injury   PROSTATE BIOPSY     REPLACEMENT TOTAL KNEE BILATERAL Bilateral 2012   TEE WITHOUT CARDIOVERSION N/A 03/04/2017   Procedure: TRANSESOPHAGEAL ECHOCARDIOGRAM (TEE);  Surgeon: [name], MD;  Location: [facility] OR;  Service: Open Heart Surgery;  Laterality: N/A;   TOTAL KNEE REVISION Right 05/18/2019   Procedure: RIGHT  PATELLA REVISION/REMOVAL;  Surgeon: Hutchinson Cordella Glendia, MD;  Location: [facility] OR;  Service: Orthopedics;  Laterality: Right;   TRANSCATHETER AORTIC VALVE REPLACEMENT, TRANSFEMORAL N/A 03/04/2017   Procedure: TRANSCATHETER AORTIC VALVE REPLACEMENT, TRANSFEMORAL;  Surgeon: [name], MD;  Location: [facility] OR;  Service: Open Heart Surgery;  Laterality: N/A;   Social History   Occupational History   Occupation: Product/process development scientist    Comment: builds malls  Tobacco Use   Smoking status: Former    Current packs/day: 0.00    Average packs/day: 3.5 packs/day for 13.0 years (45.5 ttl pk-yrs)    Types: Cigarettes    Start date: 55    Quit date: 1963    Years since quitting: 62.6   Smokeless tobacco: Never   Tobacco comments:    Former smoker 04/19/21  Vaping Use   Vaping status: Never Used  Substance and Sexual Activity   Alcohol use: Not Currently    Alcohol/week: 7.0 standard drinks of alcohol    Types: 7 Cans of beer per week    Comment: 1 beer daily after 5pm (04/19/21)   Drug use: No   Sexual activity: Never

## 2024-04-01 ENCOUNTER — Encounter: Payer: Self-pay | Admitting: Cardiology

## 2024-04-04 NOTE — Progress Notes (Signed)
 Electrophysiology Office Note:   Date:  04/05/2024  ID:  Justin [name], DOB 09-May-1934, MRN [id number]  Primary Cardiologist: [name], MD Electrophysiologist: Elspeth Sage, MD      History of Present Illness:   Justin [name] is a 89 y.o. male with h/o permanent atrial fibrillation, aortic stenosis s/p TAVR, LBBB after TAVR and s/p Medtronic BiV ICD in 2018, CAD s/p CABG in 2007 who is being seen today for ICD follow up.  Discussed the use of AI scribe software for clinical note transcription with the patient, who gave verbal consent to proceed.  History of Present Illness Justin [name] is an 89 year old male with atrial fibrillation who presents for follow-up regarding his cardiac device management. He was referred by Dr. Sage due to the doctor's retirement.  He has a history of atrial fibrillation and has been in Afib on his device since his recent generator change on June 19, 2023. He has a Medtronic Multilead ICD with a battery life of 6.5 years remaining. He is currently on amiodarone  and metoprolol .  He experiences shortness of breath with minimal exertion, such as showering and dressing.  He follows with pulmonary and they have told him that this is likely secondary to his heart failure.  He uses oxygen at night with a CPAP machine, set at two liters per minute. He is adjusting to the CPAP and typically uses it from 9:30 PM to 4:00 AM.  He reports a history of hallucinations, which were attributed to a combination of low oxygen levels, a urinary tract infection, and pneumonia. During this episode, he experienced visual hallucinations and auditory hallucinations, which he described as 'things were talking to me.'  He has a history of macular degeneration, which significantly impairs his vision, and he relies on others for medication management. He also reports bilateral knee replacements, with ongoing issues of pain and clicking, which are being managed by  another physician.   Review of systems complete and found to be negative unless listed in HPI.   EP Information / Studies Reviewed:    EKG is ordered today. Personal review as below.  EKG Interpretation Date/Time:  Monday April 05 2024 09:28:35 EDT Ventricular Rate:  83 PR Interval:    QRS Duration:  150 QT Interval:  498 QTC Calculation: 585 R Axis:   261  Text Interpretation: Ventricular-paced rhythm When compared with ECG of 23-Dec-2023 11:27, Vent. rate has decreased BY  20 BPM Confirmed by [name] Chew [phone]) on 04/05/2024 9:31:37 AM   Echo 11/18/23:   1. Left ventricular ejection fraction, by estimation, is 40 to 45%. The  left ventricle has mildly decreased function. Left ventricular diastolic  parameters are indeterminate. Abnormal wall motion adjacent to RV pacing  lead.   2. Right ventricular systolic function is moderately reduced. The right  ventricular size is moderately enlarged. There is normal pulmonary artery  systolic pressure.   3. Left atrial size was severely dilated.   4. Right atrial size was severely dilated.   5. Large pleural effusion in the left lateral region.   6. The mitral valve is grossly normal. No evidence of mitral valve  regurgitation. No evidence of mitral stenosis.   7. Tricuspid valve regurgitation is mild to moderate.   8. The aortic valve has been repaired/replaced. Aortic valve  regurgitation is not visualized. Echo findings are consistent with normal  structure and function of the aortic valve prosthesis. Aortic valve mean  gradient measures 5.0 mmHg.  Risk Assessment/Calculations:    CHA2DS2-VASc Score = 6   This indicates a 9.7% annual risk of stroke. The patient's score is based upon: CHF History: 1 HTN History: 1 Diabetes History: 1 Stroke History: 0 Vascular Disease History: 1 Age Score: 2 Gender Score: 0             Physical Exam:   VS:  BP 116/68 (BP Location: Left Arm, Patient Position: Sitting, Cuff Size:  Large)   Pulse 93   Ht 5' 6.5 (1.689 m)   Wt 243 lb (110.2 kg)   SpO2 96%   BMI 38.63 kg/m    Wt Readings from Last 3 Encounters:  04/05/24 243 lb (110.2 kg)  04/05/24 245 lb 12.8 oz (111.5 kg)  03/17/24 253 lb 12.8 oz (115.1 kg)     GEN: Well nourished, well developed in no acute distress NECK: No JVD CARDIAC: Normal rate, irregular rhythm.  Well-healed left chest ICD pocket. RESPIRATORY:  Clear to auscultation without rales, wheezing or rhonchi  ABDOMEN: Soft, non-distended EXTREMITIES:  No edema; No deformity   ASSESSMENT AND PLAN:    #S/p CRT-D: BiV pacing percentage below goal, due to AF and PVCs.  -Interrogation performed today.  Appropriate device function and stable lead parameters.  BiV pacing percentage was in the 60s. -Patient's LV threshold was 1.75 V at 0.8 ms.  We programmed LV at 2.5 V at 0.8 ms in efforts to conserve battery. - Increase lower rate limit to 75 bpm in efforts to increase BiV pacing percentage. - Increase metoprolol  to 100 mg once daily.  #Permanent atrial fibrillation #High risk medication use: Amiodarone .  He is on thyroid  supplementation.  Continue follow-up with PCP.  LFTs normal 02/2024. - Continue amiodarone  200 mg daily. -Increase metoprolol  to 100 mg once daily.  #Hypercoagulable state due to AF:  - Continue Pradaxa  150 mg twice daily.  #PVCs:Likely contributing to reduced BiV pacing percentage. - Continue amiodarone  200 mg daily. -Increase metoprolol  to 100 mg once daily.  #Chronic systolic heart failure: Well compensated on exam today.  There is concern that his AF and suboptimal BiV pacing could be contributing to decompensated heart failure. #CAD and ischemic cardiomyopathy: -Continue GDMT regimen of spironolactone , Entresto , metoprolol  and follow-up with HF clinic.  Continue torsemide  for volume management.  Follow-up in 3 months with EP APP.  Will reevaluate BiV pacing percentage at that visit, hopefully will have improved with  increasing lower rate limit and uptitrating metoprolol .  If remains below goal, then further increase metoprolol .  Maximum daily dose 200 mg.  If no significant improvement in BiV pacing percentage despite maximum metoprolol  and amiodarone  or development of worsening heart failure, then AV node ablation could be considered.  Would like to know definitively that increasing BiV pacing percentage would translate to symptomatic improvement prior to undertaking invasive procedures at his age.  Signed, Fonda Kitty, MD

## 2024-04-05 ENCOUNTER — Encounter: Payer: Self-pay | Admitting: Cardiology

## 2024-04-05 ENCOUNTER — Non-Acute Institutional Stay: Payer: Self-pay | Admitting: Nurse Practitioner

## 2024-04-05 ENCOUNTER — Encounter: Payer: Self-pay | Admitting: Nurse Practitioner

## 2024-04-05 ENCOUNTER — Ambulatory Visit: Attending: Cardiology | Admitting: Cardiology

## 2024-04-05 VITALS — BP 116/68 | HR 93 | Ht 66.5 in | Wt 243.0 lb

## 2024-04-05 DIAGNOSIS — Z0000 Encounter for general adult medical examination without abnormal findings: Secondary | ICD-10-CM | POA: Diagnosis not present

## 2024-04-05 DIAGNOSIS — I4821 Permanent atrial fibrillation: Secondary | ICD-10-CM

## 2024-04-05 DIAGNOSIS — I255 Ischemic cardiomyopathy: Secondary | ICD-10-CM | POA: Diagnosis not present

## 2024-04-05 DIAGNOSIS — Z95810 Presence of automatic (implantable) cardiac defibrillator: Secondary | ICD-10-CM

## 2024-04-05 DIAGNOSIS — I5022 Chronic systolic (congestive) heart failure: Secondary | ICD-10-CM

## 2024-04-05 DIAGNOSIS — Z79899 Other long term (current) drug therapy: Secondary | ICD-10-CM | POA: Diagnosis not present

## 2024-04-05 DIAGNOSIS — D6869 Other thrombophilia: Secondary | ICD-10-CM

## 2024-04-05 LAB — CUP PACEART INCLINIC DEVICE CHECK
Date Time Interrogation Session: 20250811124942
Implantable Lead Connection Status: 753985
Implantable Lead Connection Status: 753985
Implantable Lead Connection Status: 753985
Implantable Lead Implant Date: 20180907
Implantable Lead Implant Date: 20180907
Implantable Lead Implant Date: 20180907
Implantable Lead Location: 753858
Implantable Lead Location: 753859
Implantable Lead Location: 753860
Implantable Lead Model: 4396
Implantable Lead Model: 5076
Implantable Pulse Generator Implant Date: 20241004

## 2024-04-05 MED ORDER — METOPROLOL SUCCINATE ER 100 MG PO TB24: 100.0000 mg | ORAL_TABLET | Freq: Every day | ORAL | 3 refills | Status: DC

## 2024-04-05 NOTE — Patient Instructions (Addendum)
 eMedication Instructions:  Your physician has recommended you make the following change in your medication:  1) INCREASE Toprol  XL (metoprolol  succinate) to 100 mg once daily   *If you need a refill on your cardiac medications before your next appointment, please call your pharmacy*  Follow-Up: At [facility], you and your health needs are our priority.  As part of our continuing mission to provide you with exceptional heart care, our providers are all part of one team.  This team includes your primary Cardiologist (physician) and Advanced Practice Providers or APPs (Physician Assistants and Nurse Practitioners) who all work together to provide you with the care you need, when you need it.  Your next appointment:   3 months  Provider:   You will see one of the following Advanced Practice Providers on your designated Care Team:   Charlies Arthur, PA-C Michael Andy Tillery, PA-C Suzann Riddle, NP Daphne Barrack, NP

## 2024-04-05 NOTE — Progress Notes (Signed)
 Subjective:   Justin [name] is a 89 y.o. male who presents for Medicare Annual/Subsequent preventive examination [facility]  Visit Complete: In person  Patient Medicare AWV questionnaire was completed by the patient on 04/05/24; I have confirmed that all information answered by patient is correct and no changes since this date.        Objective:    Today's Vitals   04/05/24 0944 04/05/24 1616  BP: 126/73   Pulse: 99   Resp: 18   Temp: (!) 97.2 F (36.2 C)   SpO2: 98%   Weight: 245 lb 12.8 oz (111.5 kg)   Height: 5' 6.5 (1.689 m)   PainSc: 0-No pain 2    Body mass index is 39.08 kg/m.     02/16/2024   11:59 AM 02/09/2024    1:54 PM 01/20/2024    4:07 PM 12/30/2023   10:42 AM 12/26/2023    2:54 PM 12/25/2023   10:43 AM 11/28/2023    2:14 PM  Advanced Directives  Does Patient Have a Medical Advance Directive? Yes Yes Yes Yes Yes Yes Yes  Type of Advance Directive Living will;Healthcare Power of [employer] Power of [place];Living will Healthcare Power of [place];Living will Healthcare Power of [place];Living will Healthcare Power of [place];Living will Healthcare Power of [place];Living will;Out of facility DNR (pink MOST or yellow form) Healthcare Power of [place];Living will;Out of facility DNR (pink MOST or yellow form)  Does patient want to make changes to medical advance directive? No - Patient declined No - Patient declined No - Patient declined No - Patient declined No - Patient declined No - Patient declined No - Patient declined  Copy of Healthcare Power of Attorney in Chart? Yes - validated most recent copy scanned in chart (See row information) Yes - validated most recent copy scanned in chart (See row information)  Yes - validated most recent copy scanned in chart (See row information) Yes - validated most recent copy scanned in chart (See row information) Yes - validated most recent copy scanned in chart (See row information) Yes - validated most recent copy  scanned in chart (See row information)    Current Medications (verified) Outpatient Encounter Medications as of 04/05/2024  Medication Sig   albuterol  (VENTOLIN  HFA) 108 (90 Base) MCG/ACT inhaler Inhale 2 puffs into the lungs every 4 (four) hours as needed for wheezing or shortness of breath.   allopurinol  (ZYLOPRIM ) 100 MG tablet Take 1 tablet (100 mg total) by mouth daily.   amiodarone  (PACERONE ) 200 MG tablet Take 1 tablet (200 mg total) by mouth daily.   aspirin  81 MG tablet Take 1 tablet (81 mg total) by mouth daily.   atorvastatin  (LIPITOR) 20 MG tablet TAKE ONE TABLET ONCE DAILY   Baclofen 5 MG TABS Take 5 mg by mouth every 12 (twelve) hours as needed.   calcium  carbonate (OS-CAL) 1250 (500 Ca) MG chewable tablet Chew 1 tablet by mouth daily.   Calcium -Magnesium -Zinc-Vit D3 (CALCIUM -MAGNESIUM -ZINC-D3 PO) Take 2 tablets by mouth daily.   colchicine  0.6 MG tablet Take 1 tablet (0.6 mg total) by mouth every 6 (six) hours as needed (gout).   dabigatran  (PRADAXA ) 150 MG CAPS capsule TAKE (1) CAPSULE TWICE DAILY.   diclofenac  Sodium (VOLTAREN ) 1 % GEL Apply 4 g topically 3 (three) times daily as needed (pain).   docusate sodium  (COLACE) 100 MG capsule Take 100 mg by mouth 2 (two) times daily.   fluticasone  (FLONASE ) 50 MCG/ACT nasal spray Place 2 sprays into both nostrils daily.  insulin  aspart (NOVOLOG ) 100 UNIT/ML injection Inject 4-16 Units into the skin as directed. Inject as per sliding scale: if 141 - 180 = 4 units Inject per sliding scale; 181 - 220 = 6 units Inject per sliding scale; 221 - 260 = 8 units Inject per sliding scale; 261 - 300 = 10 units Inject per sliding scale; 301 - 350 = 12 units Inject per sliding scale; 351 - 400= 14 units Inject per sliding scale; 401+ = 16 units Inject per sliding scale, subcutaneously three times a day   isosorbide  mononitrate (IMDUR ) 30 MG 24 hr tablet Take 1 tablet (30 mg total) by mouth daily.   ketotifen  (ZADITOR ) 0.025 % ophthalmic solution  Place 1 drop into both eyes daily as needed (for itching).   levothyroxine  (SYNTHROID ) 175 MCG tablet Take 175 mcg by mouth daily.   metoCLOPramide  (REGLAN ) 10 MG tablet Take 10 mg by mouth 2 (two) times daily.   metolazone  (ZAROXOLYN ) 2.5 MG tablet Take 1 tablet (2.5 mg total) by mouth once a week every Saturday   metoprolol  succinate (TOPROL -XL) 50 MG 24 hr tablet Take 50 mg by mouth daily. Take with or immediately following a meal.   montelukast  (SINGULAIR ) 10 MG tablet TAKE ONE TABLET BY MOUTH ONCE DAILY IN THE EVENING   Multiple Vitamins-Minerals (MULTI-VITAMIN GUMMIES) CHEW Chew 2 Pieces by mouth daily.   Multiple Vitamins-Minerals (PRESERVISION AREDS 2) CAPS Take 1 capsule by mouth in the morning and at bedtime.   OXYGEN Inhale 2 L into the lungs as directed. Resident to wear O2 when ambulating around the unit and during therapy every shift   pantoprazole  (PROTONIX ) 40 MG tablet Take 40 mg by mouth 2 (two) times daily.   polyethylene glycol powder (GLYCOLAX/MIRALAX) 17 GM/SCOOP powder Take 17 g by mouth daily as needed for moderate constipation.   Potassium Chloride  ER 20 MEQ TBCR Take 4 tablets (80 mEq total) by mouth 2 (two) times daily.   Probiotic Product (PROBIOTIC PO) Take 1 capsule by mouth daily.   sacubitril-valsartan (ENTRESTO ) 24-26 MG Take 0.5 tablets by mouth 2 (two) times daily.   spironolactone  (ALDACTONE ) 25 MG tablet Take 1 tablet (25 mg total) by mouth daily.   tamsulosin (FLOMAX) 0.4 MG CAPS capsule Take 0.4 mg by mouth daily.   Tiotropium Bromide Monohydrate  (SPIRIVA  RESPIMAT) 2.5 MCG/ACT AERS Inhale 2 puffs into the lungs daily.   torsemide  (DEMADEX ) 20 MG tablet Take 4 tablets (80 mg total) by mouth in the morning AND 2 tablets (40 mg total) every evening.   triamcinolone  cream (KENALOG ) 0.1 % Apply 1 application  topically daily as needed for dry skin (affected areas).   valACYclovir  (VALTREX ) 500 MG tablet Take 500 mg by mouth every 12 (twelve) hours as needed (cold  sores).   venlafaxine  XR (EFFEXOR  XR) 37.5 MG 24 hr capsule Take 1 capsule (37.5 mg total) by mouth daily with breakfast.   cetirizine (ZYRTEC) 10 MG tablet Take 10 mg by mouth 2 (two) times daily. As needed   [DISCONTINUED] ipratropium (ATROVENT ) 0.03 % nasal spray Place 2 sprays into both nostrils 2 (two) times daily. (Patient not taking: Reported on 04/05/2024)   [DISCONTINUED] Menthol , Topical Analgesic, (BIOFREEZE COOL THE PAIN) 4 % GEL Apply 4 % topically in the morning, at noon, in the evening, and at bedtime. (Patient not taking: Reported on 04/05/2024)   [DISCONTINUED] metoprolol  succinate (TOPROL -XL) 100 MG 24 hr tablet Take 1 tablet (100 mg total) by mouth daily. Take with or immediately following a meal. (  Patient not taking: Reported on 04/05/2024)   [DISCONTINUED] metoprolol  succinate (TOPROL -XL) 50 MG 24 hr tablet Take 1 tablet (50 mg total) by mouth daily. Take with or immediately following a meal.   [DISCONTINUED] nystatin (MYCOSTATIN/NYSTOP) powder Apply 1 Application topically 2 (two) times daily. (Patient not taking: Reported on 04/05/2024)   [DISCONTINUED] oxyCODONE  (ROXICODONE ) 5 MG immediate release tablet Take 0.5 tablets (2.5 mg total) by mouth every 4 (four) hours as needed for severe pain (pain score 7-10). (Patient not taking: Reported on 04/05/2024)   Facility-Administered Encounter Medications as of 04/05/2024  Medication   azithromycin  (ZITHROMAX ) tablet 500 mg    Allergies (verified) Peanut-containing drug products, Sulfonamide derivatives, Amlodipine , Eliquis  [apixaban ], Lisinopril , and Xarelto  [rivaroxaban ]   History: Past Medical History:  Diagnosis Date   Age-related macular degeneration, dry, both eyes    Allergic    24/7; 365 days/year; I'm allergic to pollens, dust, all southern grasses/trees, mold, mildue, cats, dogs (01/07/2017)   Anal fissure    Asthma    sees Dr. Alaine    Benign prostatic hypertrophy    (sees Dr. Colan   CAD (coronary artery  disease)    a. s/p CABG 2007. b. Cath 08/2016 - 4/4 patent grafts.   Carotid bruit    carotid u/s 10/10: 0.39% bilaterally   Chronic combined systolic and diastolic CHF (congestive heart failure) (HCC)    Complication of anesthesia 1980s   w/anal cyst OR, he gave me a saddle block then put a narcotic in spinal cord; had a severe reaction to that (01/07/2017)   Congestive heart failure (CHF) [facility])    ED (erectile dysfunction)    Family history of adverse reaction to anesthesia    daughter wakes up during OR (01/07/2017)   GERD (gastroesophageal reflux disease)    Gout    HTN (hypertension)    Hx of colonic polyps    (sees Dr. Abran)   Hyperlipidemia    Hypothyroidism    Moderate to severe aortic stenosis    a. s/p TAVR 02/2017.   Myocardial infarction [facility]) ~ 2000   Obesity    Osteoarthritis    was in my knees, hands (01/07/2017 )   PAF (paroxysmal atrial fibrillation) (HCC)    a. documented post TAVR.   Precancerous skin lesion    (sees Dr. 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   29 mm Edwards Sapien 3 transcatheter heart valve placed via percutaneous right transfemoral approach   Past Surgical History:  Procedure Laterality Date   ANUS SURGERY     opened it back up cause it wouldn't heal; wound up w/a fissure (01/07/2017)   BIV ICD INSERTION CRT-D N/A 05/02/2017   Procedure: BIV ICD INSERTION CRT-D;  Surgeon: [name], MD;  Location: [facility] INVASIVE CV LAB;  Service: Cardiovascular;  Laterality: N/A;   CARDIAC CATHETERIZATION  10/29/2005   CARDIAC CATHETERIZATION N/A 08/27/2016   Procedure: Right/Left Heart Cath and Coronary/Graft Angiography;  Surgeon: Lonni JONETTA Cash, MD;  Location: [facility] INVASIVE CV LAB;  Service: Cardiovascular;  Laterality: N/A;   CARDIOVERSION N/A 04/24/2021   Procedure: CARDIOVERSION;  Surgeon: Okey Vina GAILS, MD;  Location: [facility] ENDOSCOPY;  Service:  Cardiovascular;  Laterality: N/A;   CARDIOVERSION N/A 11/08/2021   Procedure: CARDIOVERSION;  Surgeon: Mona Vinie BROCKS, MD;  Location: [facility] ENDOSCOPY;  Service: Cardiovascular;  Laterality: N/A;   CARDIOVERSION N/A 02/06/2022   Procedure: CARDIOVERSION;  Surgeon: Delford Maude BROCKS, MD;  Location: [facility] ENDOSCOPY;  Service: Cardiovascular;  Laterality: N/A;   CATARACT EXTRACTION W/ INTRAOCULAR LENS  IMPLANT, BILATERAL Bilateral    CATARACT EXTRACTION, BILATERAL  2012   COLONOSCOPY  06/30/2008   no repeats needed    COLONOSCOPY     had 3 or 4 in the past    CORONARY ARTERY BYPASS GRAFT  2007   CABG X4   CYST EXCISION PERINEAL  1980s   HAMMER TOE SURGERY Bilateral    ICD GENERATOR CHANGEOUT N/A 05/30/2023   Procedure: ICD GENERATOR CHANGEOUT;  Surgeon: [name], MD;  Location: [facility] INVASIVE CV LAB;  Service: Cardiovascular;  Laterality: N/A;   JOINT REPLACEMENT     KNEE ARTHROPLASTY  07/30/2011   Procedure: COMPUTER ASSISTED TOTAL KNEE ARTHROPLASTY;  Surgeon: Cordella Glendia Hutchinson;  Location: [facility] OR;  Service: Orthopedics;  Laterality: Left;  left total knee arthroplasty   MASTECTOMY SUBCUTANEOUS Bilateral    MULTIPLE TOOTH EXTRACTIONS     ORIF FINGER / THUMB FRACTURE Right ~ 1980   repair of thumb injury   PROSTATE BIOPSY     REPLACEMENT TOTAL KNEE BILATERAL Bilateral 2012   TEE WITHOUT CARDIOVERSION N/A 03/04/2017   Procedure: TRANSESOPHAGEAL ECHOCARDIOGRAM (TEE);  Surgeon: [name], MD;  Location: [facility] OR;  Service: Open Heart Surgery;  Laterality: N/A;   TOTAL KNEE REVISION Right 05/18/2019   Procedure: RIGHT PATELLA REVISION/REMOVAL;  Surgeon: Hutchinson Cordella Glendia, MD;  Location[facility] OR;  Service: Orthopedics;  Laterality: Right;   TRANSCATHETER AORTIC VALVE REPLACEMENT, TRANSFEMORAL N/A 03/04/2017   Procedure: TRANSCATHETER AORTIC VALVE REPLACEMENT, TRANSFEMORAL;  Surgeon: [name], MD;  Location: [facility] OR;  Service: Open Heart Surgery;  Laterality: N/A;   Family  History  Problem Relation Age of Onset   Heart attack Father 45   Allergic rhinitis Father    Asthma Father    Heart failure Mother 32   Uterine cancer Mother    Breast cancer Mother    Colon cancer Neg Hx    Esophageal cancer Neg Hx    Social History   Socioeconomic History   Marital status: Married    Spouse name: Not on file   Number of children: 3   Years of education: Not on file   Highest education level: Not on file  Occupational History   Occupation: Product/process development scientist    Comment: builds malls  Tobacco Use   Smoking status: Former    Current packs/day: 0.00    Average packs/day: 3.5 packs/day for 13.0 years (45.5 ttl pk-yrs)    Types: Cigarettes    Start date: 89    Quit date: 1963    Years since quitting: 62.6   Smokeless tobacco: Never   Tobacco comments:    Former smoker 04/19/21  Vaping Use   Vaping status: Never Used  Substance and Sexual Activity   Alcohol use: Not Currently    Alcohol/week: 7.0 standard drinks of alcohol    Types: 7 Cans of beer per week    Comment: 1 beer daily after 5pm (04/19/21)   Drug use: No   Sexual activity: Never  Other Topics Concern   Not on file  Social History Narrative   FH of CAD, Male 1st degree relative less than age 37.   Social Drivers of Corporate investment banker Strain: Low Risk  (03/31/2023)   Overall Financial Resource Strain (CARDIA)    Difficulty of Paying Living Expenses: Not very hard  Food Insecurity: No  Food Insecurity (11/18/2023)   Hunger Vital Sign    Worried About Running Out of Food in the Last Year: Never true    Ran Out of Food in the Last Year: Never true  Transportation Needs: No Transportation Needs (11/18/2023)   PRAPARE - Administrator, Civil Service (Medical): No    Lack of Transportation (Non-Medical): No  Physical Activity: Sufficiently Active (03/31/2023)   Exercise Vital Sign    Days of Exercise per Week: 7 days    Minutes of Exercise per Session: 30 min  Stress: No  Stress Concern Present (03/31/2023)   [employer] of Occupational Health - Occupational Stress Questionnaire    Feeling of Stress : Not at all  Social Connections: Socially Integrated (11/18/2023)   Social Connection and Isolation Panel    Frequency of Communication with Friends and Family: Three times a week    Frequency of Social Gatherings with Friends and Family: Three times a week    Attends Religious Services: More than 4 times per year    Active Member of Clubs or Organizations: Yes    Attends Banker Meetings: More than 4 times per year    Marital Status: Married    Tobacco Counseling Counseling given: Not Answered Tobacco comments: Former smoker 04/19/21   Clinical Intake:  Pre-visit preparation completed: Yes  Pain : 0-10 Pain Score: 2  Pain Location: Knee Pain Orientation: Right Pain Descriptors / Indicators: Aching Pain Onset: Other (comment) Pain Frequency: Intermittent Pain Relieving Factors: s/p inj Effect of Pain on Daily Activities: less walking, uses motorized scooter for mobility  Pain Relieving Factors: s/p inj  BMI - recorded: 39.08 Nutritional Status: BMI > 30  Obese Nutritional Risks: None Diabetes: Yes CBG done?: Yes CBG resulted in Enter/ Edit results?: No Did pt. bring in CBG monitor from home?: No  How often do you need to have someone help you when you read instructions, pamphlets, or other written materials from your doctor or pharmacy?: 3 - Sometimes What is the last grade level you completed in school?: college  Interpreter Needed?: No  Information entered by :: Kamy Poinsett Lorenda Hark NP   Activities of Daily Living    04/05/2024    4:17 PM 04/05/2024    2:56 PM  In your present state of health, do you have any difficulty performing the following activities:  Hearing? 1 1  Comment  hearing aids  Vision? 1 0  Comment macular degeneration, cannot see details.   Difficulty concentrating or making decisions?  1  Walking or  climbing stairs?  1  Dressing or bathing?  1  Doing errands, shopping?  1    Patient Care Team: Verdia Bolt X, NP as PCP - General (Internal Medicine) [name], MD as PCP - Cardiology (Cardiology) [name], MD as PCP - Electrophysiology (Cardiology) Nieves Cough, MD as Consulting Physician (Urology) Liane Sharyne MATSU, [facility] (Inactive) as Pharmacist (Pharmacist) Leslee Reusing, MD as Consulting Physician (Ophthalmology)  Indicate any recent Medical Services you may have received from other than [facility] providers in the past year (date may be approximate).     Assessment:   This is a routine wellness examination for [place].  Hearing/Vision screen No results found.   Goals Addressed             This Visit's Progress    Functional Decline Minimized       Evidence-based guidance:  Assess fall risk, including balance and gait impairment, muscle weakness, diminished  vision or hearing, environmental hazards, effects of medication and dehydration.  Assess and review gait speed, decreased muscle strength or impaired functional mobility; consider use of validated tool if available.  Prepare patient for rehabilitation services to develop an individualized activity and exercise program as indicated, such as progressive resistance strengthening, balance and activity tolerance training or home exercise program.  Prevent falls with environmental adjustment; encourage compliance with exercise program, management of incontinence and adequate vitamin D  and calcium  intake from food or supplements.  Promote gradual increase in intensity of activity and exercise as tolerated, such as duration, frequency, exercise repetition and sets and intensity.  Provide guidance and interventions aimed at fall prevention.  Review or assess presence of reduced muscle mass or results of dual-energy x-ray absorptiometry.   Notes:        Depression Screen    04/05/2024    4:17 PM  04/05/2024    2:57 PM 02/10/2024    9:05 AM 11/13/2023    1:50 PM 10/09/2023    1:31 PM 03/31/2023   10:41 AM 10/10/2022   11:30 AM  PHQ 2/9 Scores  PHQ - 2 Score 0 0 0 5 4 0 0  PHQ- 9 Score    18 10 0 0    Fall Risk    02/10/2024    9:05 AM 12/31/2023    8:52 AM 11/13/2023    1:50 PM 10/09/2023    1:31 PM 03/31/2023   10:48 AM  Fall Risk   Falls in the past year? 0 0 0 0 0  Number falls in past yr: 0  0 0 0  Injury with Fall? 0  0 0 0  Risk for fall due to :   Impaired balance/gait;Impaired mobility History of fall(s) No Fall Risks  Follow up   Falls evaluation completed Falls evaluation completed Falls evaluation completed    MEDICARE RISK AT HOME: Medicare Risk at Home Any stairs in or around the home?: Yes If so, are there any without handrails?: No Adequate lighting in your home to reduce risk of falls?: Yes Life alert?: No Use of a cane, walker or w/c?: Yes Grab bars in the bathroom?: Yes Shower chair or bench in shower?: Yes Elevated toilet seat or a handicapped toilet?: Yes  TIMED UP AND GO:  Was the test performed?  No    Cognitive Function:        03/31/2023   10:49 AM 03/25/2022   11:21 AM  6CIT Screen  What Year? 0 points 0 points  What month? 0 points 0 points  What time? 0 points 0 points  Count back from 20 2 points 4 points  Months in reverse 0 points 0 points  Repeat phrase 0 points 0 points  Total Score 2 points 4 points    Immunizations Immunization History  Administered Date(s) Administered   Fluad Quad(high Dose 65+) 05/06/2019, 05/30/2020, 06/18/2021, 05/27/2023   Hep A / Hep B 04/06/2012, 10/07/2012   Hepatitis B 05/07/2012   Influenza Split 06/28/2013   Influenza Whole 06/05/2007, 05/13/2008, 06/06/2009, 05/30/2010   Influenza, High Dose Seasonal PF 05/15/2018, 05/31/2022   Influenza,inj,Quad PF,6+ Mos 05/20/2016   Influenza-Unspecified 05/25/2014, 07/08/2015, 05/06/2017, 07/29/2023   Moderna Covid-19 Vaccine Bivalent Booster 38yrs & up  05/31/2022   PFIZER(Purple Top)SARS-COV-2 Vaccination 09/28/2019, 10/12/2019, 05/30/2020   PPD Test 03/31/2023   Pfizer Covid-19 Vaccine Bivalent Booster 92yrs & up 11/18/2021, 07/29/2023   Pneumococcal Conjugate-13 09/18/2015   Pneumococcal Polysaccharide-23 05/13/2008, 05/25/2014   Td 05/30/2010  Tdap 10/27/2020   Zoster Recombinant(Shingrix) 05/18/2018, 10/16/2018   Zoster, Live 10/21/2007    TDAP status: Up to date  Flu Vaccine status: Up to date  Pneumococcal vaccine status: Up to date  Covid-19 vaccine status: Information provided on how to obtain vaccines.   Qualifies for Shingles Vaccine? Yes   Zostavax completed Yes   Shingrix Completed?: Yes  Screening Tests Health Maintenance  Topic Date Due   INFLUENZA VACCINE  04/26/2024 (Originally 03/26/2024)   COVID-19 Vaccine (7 - 2024-25 season) 05/26/2025 (Originally 09/23/2023)   HEMOGLOBIN A1C  04/12/2024   OPHTHALMOLOGY EXAM  11/06/2024   FOOT EXAM  04/05/2025   Medicare Annual Wellness (AWV)  04/05/2025   DTaP/Tdap/Td (3 - Td or Tdap) 10/28/2030   Pneumococcal Vaccine: 50+ Years  Completed   Hepatitis B Vaccines  Completed   Zoster Vaccines- Shingrix  Completed   HPV VACCINES  Aged Out   Meningococcal B Vaccine  Aged Out    Health Maintenance  There are no preventive care reminders to display for this patient.   Colorectal cancer screening: No longer required.   Lung Cancer Screening: (Low Dose CT Chest recommended if Age 5-80 years, 20 pack-year currently smoking OR have quit w/in 15years.) does not qualify.   Lung Cancer Screening Referral: NA  Additional Screening:  Hepatitis C Screening: does not qualify;   Vision Screening: Recommended annual ophthalmology exams for early detection of glaucoma and other disorders of the eye. Is the patient up to date with their annual eye exam?  Yes  Who is the provider or what is the name of the office in which the patient attends annual eye exams? Dr. Mcuan.  If  pt is not established with a provider, would they like to be referred to a provider to establish care? No .   Dental Screening: Recommended annual dental exams for proper oral hygiene  Diabetic Foot Exam: Diabetic Foot Exam: Completed 04/05/24  Community Resource Referral / Chronic Care Management: CRR required this visit?  No   CCM required this visit?  No     Plan:     I have personally reviewed and noted the following in the patient's chart:   Medical and social history Use of alcohol, tobacco or illicit drugs  Current medications and supplements including opioid prescriptions. Patient is not currently taking opioid prescriptions. Functional ability and status Nutritional status Physical activity Advanced directives List of other physicians Hospitalizations, surgeries, and ER visits in previous 12 months Vitals Screenings to include cognitive, depression, and falls Referrals and appointments  In addition, I have reviewed and discussed with patient certain preventive protocols, quality metrics, and best practice recommendations. A written personalized care plan for preventive services as well as general preventive health recommendations were provided to patient.     Lasonia Casino X Cervando Durnin, NP   04/06/2024   After Visit Summary: (In Person-Declined) Patient declined AVS at this time.

## 2024-04-08 ENCOUNTER — Ambulatory Visit: Payer: Self-pay | Admitting: Cardiology

## 2024-04-08 ENCOUNTER — Other Ambulatory Visit (HOSPITAL_COMMUNITY): Payer: Self-pay | Admitting: Cardiology

## 2024-04-12 ENCOUNTER — Ambulatory Visit: Attending: Cardiology

## 2024-04-12 DIAGNOSIS — I5042 Chronic combined systolic (congestive) and diastolic (congestive) heart failure: Secondary | ICD-10-CM | POA: Diagnosis not present

## 2024-04-12 DIAGNOSIS — Z95810 Presence of automatic (implantable) cardiac defibrillator: Secondary | ICD-10-CM

## 2024-04-13 NOTE — Progress Notes (Signed)
 EPIC Encounter for ICM Monitoring  Patient Name: Justin [name] is a 89 y.o. male Date: 04/13/2024 Primary Care Physican: Mast, Man X, NP Primary Cardiologist: McAlhany/Sabharwal  Electrophysiologist:  [name] Pore Pacing: 77.4%  09/27/2022 Weight: 270 lbs 02/12/2023 Weight: 261 lbs 07/07/2023 Office Weight: 261 lbs 10/07/2023 Weight: 245-250 lbs 10/24/2023 Office Weight: 252 lbs 03/01/2024 Office Weight: 252 lbs 03/17/2024 Office Weight: 253 lbs   Time in AT/AF  24.0 hr/day (100.0%)   Transmission results reviewed.   Optivol thoracic impedance suggesting possible fluid accumulation 8/12 and trending back close to baseline.   Prescribed: Torsemide  20 mg take 4 tablets (80 mg total) by mouth every morning and 2 tablets (40 mg total) every afternoon    Potassium 20 mEq take 4 tablet(s) (80 mEq total) by mouth twice a day.     Metolazone  2.5 mg Take 1 tablet by mouth once a week every Saturday (per 7/15 phone note should take extra 40 mEq of Potassium with Metolazone ).   Spironolactone  25 mg take 1 tablet daily   Labs: 03/25/2024 Creatinine 1.45, BUN 31, Potassium 4.0, Sodium 132, GFR 47 03/09/2024 Creatinine 1.43, BUN 32, Potassium 3.5, Sodium 131, GFR 47 02/19/2024 Creatinine 1.21, BUN 29, Potassium 4.3, Sodium 132, GFR 58 01/29/2024 Creatinine 1.25, BUN 20, Potassium 4.8, Sodium 135, GFR 56, BNP 454 01/20/2024 Creatinine 1.54, BUN 32, Potassium 3.4, Sodium 129, GFR 43  01/16/2024 BNP 823.5 12/23/2023 Creatinine 1.58, BUN 20, Potassium 3.7, Sodium 132, GFR 42, BNP 579.7 12/16/2023 Creatinine 1.3,   BUN 25, Potassium 3.9, Sodium 134  11/27/2023 Creatinine 1.32, BUN 46, Potassium 3.8, Sodium 134, GFR 52 A complete set of results can be found in Results Review.   Recommendations:  No changes.     Follow-up plan: ICM clinic phone appointment on 05/17/2024.   91 day device clinic remote transmission 06/01/2024.     EP/Cardiology Office Visits:    04/28/2024 with HF clinic.  07/06/2024 with  [name], PA.  08/03/2024 with [name].        Copy of ICM check sent to Dr [name].    3 month ICM trend: 04/12/2024.    12-14 Month ICM trend:     [name], RN 04/13/2024 10:11 AM

## 2024-04-14 ENCOUNTER — Ambulatory Visit (HOSPITAL_BASED_OUTPATIENT_CLINIC_OR_DEPARTMENT_OTHER): Admitting: Primary Care

## 2024-04-14 ENCOUNTER — Encounter (HOSPITAL_BASED_OUTPATIENT_CLINIC_OR_DEPARTMENT_OTHER): Payer: Self-pay | Admitting: Primary Care

## 2024-04-14 VITALS — BP 97/66 | HR 98 | Ht 66.5 in | Wt 258.4 lb

## 2024-04-14 DIAGNOSIS — G4733 Obstructive sleep apnea (adult) (pediatric): Secondary | ICD-10-CM

## 2024-04-14 DIAGNOSIS — I5022 Chronic systolic (congestive) heart failure: Secondary | ICD-10-CM

## 2024-04-14 NOTE — Progress Notes (Signed)
 @Patient  ID: Justin [name], male    DOB: 1933-10-04, 89 y.o.   MRN: [id number]  No chief complaint on file.   Referring provider: Mast, Man X, NP  HPI: 89 year old, former smoker. PMH significant for CHF, CAD, MI, aortic stenosis s/p TAVR, afib, hypothyroidism, type 2 diabetes, irritable larynx syndrome, asthma, viral pneumonia, CKD, CABG x4, hyperlipidemia.  Previous [facility] pulmonary encounter:  11/12/23- Dr. Jess Mr. Riedesel is an 89 y.o. w/ a h/o hyperlipidemia, obesity, gout, DMII, HTN, GERD, anxiety and depression who presents for c/o loud snoring and excessive daytime sleepiness which has been present for several years. Reports nocturnal awakenings due to nocturia, however does not have difficulty falling back to sleep. Reports a 40 lb weight loss over the last few months. Admits to dry mouth. Denies morning headaches, RLS symptoms, dream enactment, cataplexy, hypnagogic or hypnapompic hallucinations. Reports a family history of sleep apnea. Denies drowsy driving. Denies caffeine intake. Denies alcohol, tobacco or illicit drug use.    Bedtime 11-11:30 pm Sleep onset 5 mins Rise time 9:30 am  12/31/23- Dr. Neda   Chief complaint:   Cough and shortness of breath   HPI:   Shortness of breath Desaturations during physical therapy   History of heart failure, cardiomyopathy Recent evaluation for possible sleep apnea   Was recently hospitalized with respite tract infection, pleural effusion for which he required thoracentesis Was hospitalized for by 11 days   Has been noted to desaturate into the 80s with activity   He does have macular degeneration Needs a walker for stability He is able to walk for about 5 to 6 minutes Ongoing treatment for urinary tract infection   History of chronic systolic failure, coronary artery disease, CABG in 2007, aortic stenosis s/p TAVR, permanent A-fib, HFrEF, cardiac resynchronization therapy in 2024, chronic kidney disease,  hypothyroidism   Reformed smoker quit over 60 years ago but was smoking about 3 and half packs a day   Recent evaluation with Dr. Jess for sleep apnea, has a sleep study scheduled   No pertinent occupational history  Assessment:  Shortness of breath is likely multifactorial Deconditioning likely playing a role He does have a history of heart failure with reduced ejection fraction which is being optimized   Recent hospitalization with significant deconditioning   Exercise desaturations   Atrial fibrillation   Coronary artery disease   Aortic stenosis s/p intervention   Plan/Recommendations:   Obtain chest x-ray today   Exercise oximetry   Follow-up with your sleep study   Graded activities as tolerated   Try and ensure that you continue with physical therapy as tolerated   Continue to optimize treatment for heart failure   Follow-up in 6 to 8 weeks   Was ambulated in the office today and did desaturate with exercise Requires 4 L of oxygen via portable oxygen concentrator, 2 L of continuous flow     02/17/2024 Discussed the use of AI scribe software for clinical note transcription with the patient, who gave verbal consent to proceed.  History of Present Illness   Justin [name] is an 89 year old male with congestive heart failure and chronic obstructive pulmonary disease who presents for a follow-up breathing test. He is accompanied by his daughter.   He is here for a follow-up breathing test after being hospitalized for 11 days in March due to respiratory failure, multifocal pneumonia and heart failure. A sleep study revealed severe obstructive sleep apnea. Auto CPAP therapy was recommended  but delayed due to follow-up.   He has a significant cardiac history including congestive heart failure, coronary artery disease, aortic stenosis, and atrial fibrillation. He underwent CABG in 2007 and a transaortic valve replacement. He is currently on amiodarone , and his  diuretic dosage was recently increased to manage fluid overload, noted in his lungs, legs, and jugular vein.  He has a history of smoking, having quit 62 years ago after smoking three and a half packs a day. He experiences chronic shortness of breath and a cough, described as 'just clearing my throat.' Lung function measured at 80% post-bronchodilator with curvature on flow volume loop indicating mild obstructive lung disease.  He uses oxygen therapy, with four liters during the day when using portable concentrator and two liters when using continuous flow oxygen. He is 'always short of breath'. His weight has fluctuated due to fluid retention, and he monitors it closely.  No recent flare-ups or other concerning symptoms.     Severe obstructive sleep apnea (OSA) (ICD-10: G47.33) The sleep study conducted last month confirmed severe OSA. Due to existing oxygen requirements, blending two liters of oxygen into the CPAP is advised. CPAP usage for a minimum of four hours per night is necessary to meet insurance requirements of 70% usage.  - Start auto CPAP 4 to 20 cm H2O with a nasal mask - Coordinate CPAP and oxygen adapter delivery - Blend 2 liters of oxygen into CPAP for nighttime use - Ensure CPAP usage for a minimum of 4-6 hours per night, 70% of the time - Follow up with Doctor Jess regarding CPAP and oxygen requirements   Mild obstructive lung disease (COPD) (ICD-10: J44.9) Pulmonary function tests indicate mild obstructive lung disease with lung function at 80% post-bronchodilator, no significant BD response, curvature on flow volume loop. His history of smoking contributed to COPD development. Recommend trial scheduled bronchodilator, Spiriva  Respimat 2.5mcg two puffs once daily, to help alleviate dyspnea symptoms - Initiate Spiriva  Respimat 2.5mcg daily - Monitor for improvement in dyspnea and activity tolerance - Report changes or lack of improvement via MyChart   Chronic respiratory  failure - Continue 2L supplement oxygen to maintain O2 >88-90%    Follow-up 6-8 weeks for CPAP compliance and assess response to Spiriva      04/14/2024- Interim hx  Discussed the use of AI scribe software for clinical note transcription with the patient, who gave verbal consent to proceed.  History of Present Illness Justin [name] is an 89 year old male with mild obstructive lung disease and severe sleep apnea who presents for follow-up regarding his respiratory status and CPAP compliance.  He had a breathing test that showed 80% lung function. He was started on Spiriva  Respimat due to his smoking history, having quit smoking 62 years ago after smoking three and a half packs a day. He uses the inhaler once a day but experiences choking. No significant change in breathing or energy levels has been noted. He manages short walks but experiences heavy breathing, which he attributes to deep breathing exercises learned in therapy.  He underwent a sleep study and uses an auto CPAP machine with oxygen connected at night. He uses the CPAP from around 9:30-10:00 PM until 4-5 AM, achieving more than four hours of usage per night. There was a break in therapy for about two weeks in July due to pain from a tooth extraction, which prevented CPAP use. He wakes up frequently to urinate, attributed to diuretic use, but returns to sleep quickly. He  feels he is getting restful sleep despite these interruptions.  He has experienced a weight gain of approximately 13 pounds since June. His current medications include torsemide  80 mg in the morning and 40 mg in the evening, potassium 80 mg twice a day, metolazone  2.5 mg on Saturdays with extra potassium, spironolactone  25 mg daily, Entresto  twice a day, and metoprolol . His daughter notes that he is always 'a few days away from a hospital visit' due to fluid issues.  Airview download 03/15/24-04/13/24 Usage days 20/30 days (67%) Average usage days used 6 hours 28  mins Pressure 5-20cm h20 (10.2cm h20-95%) Airleaks 17.7L/min (95%) AHI 1.0   Pulmonary function testing 02/17/24>. FVC 2.26 (70%), FEV1 1.77 (80%), ratio 78, TLC 65%, DLCOunc 13.06 (61%) -Mild obstruction with curvature on flow volume loop. No BD response. Moderate diffusion defect.    Allergies  Allergen Reactions   Peanut-Containing Drug Products Anaphylaxis   Sulfonamide Derivatives Anaphylaxis   Amlodipine  Swelling and Other (See Comments)    Swelling in ankles   Eliquis  [Apixaban ] Other (See Comments)    Back/hip pain   Lisinopril  Cough   Xarelto  [Rivaroxaban ] Other (See Comments)    Back/hip pain    Immunization History  Administered Date(s) Administered   Fluad Quad(high Dose 65+) 05/06/2019, 05/30/2020, 06/18/2021, 05/27/2023   Hep A / Hep B 04/06/2012, 10/07/2012   Hepatitis B 05/07/2012   Influenza Split 06/28/2013   Influenza Whole 06/05/2007, 05/13/2008, 06/06/2009, 05/30/2010   Influenza, High Dose Seasonal PF 05/15/2018, 05/31/2022   Influenza,inj,Quad PF,6+ Mos 05/20/2016   Influenza-Unspecified 05/25/2014, 07/08/2015, 05/06/2017, 07/29/2023   Moderna Covid-19 Vaccine Bivalent Booster 15yrs & up 05/31/2022   PFIZER(Purple Top)SARS-COV-2 Vaccination 09/28/2019, 10/12/2019, 05/30/2020   PPD Test 03/31/2023   Pfizer Covid-19 Vaccine Bivalent Booster 36yrs & up 11/18/2021, 07/29/2023   Pneumococcal Conjugate-13 09/18/2015   Pneumococcal Polysaccharide-23 05/13/2008, 05/25/2014   Td 05/30/2010   Tdap 10/27/2020   Zoster Recombinant(Shingrix) 05/18/2018, 10/16/2018   Zoster, Live 10/21/2007    Past Medical History:  Diagnosis Date   Age-related macular degeneration, dry, both eyes    Allergic    24/7; 365 days/year; I'm allergic to pollens, dust, all [place] grasses/trees, mold, mildue, cats, dogs (01/07/2017)   Anal fissure    Asthma    sees Dr. Alaine    Benign prostatic hypertrophy    (sees Dr. Colan   CAD (coronary artery disease)    a. s/p  CABG 2007. b. Cath 08/2016 - 4/4 patent grafts.   Carotid bruit    carotid u/s 10/10: 0.39% bilaterally   Chronic combined systolic and diastolic CHF (congestive heart failure) (HCC)    Complication of anesthesia 1980s   w/anal cyst OR, he gave me a saddle block then put a narcotic in spinal cord; had a severe reaction to that (01/07/2017)   Congestive heart failure (CHF) [facility])    ED (erectile dysfunction)    Family history of adverse reaction to anesthesia    daughter wakes up during OR (01/07/2017)   GERD (gastroesophageal reflux disease)    Gout    HTN (hypertension)    Hx of colonic polyps    (sees Dr. Abran)   Hyperlipidemia    Hypothyroidism    Moderate to severe aortic stenosis    a. s/p TAVR 02/2017.   Myocardial infarction [facility]) ~ 2000   Obesity    Osteoarthritis    was in my knees, hands (01/07/2017 )   PAF (paroxysmal atrial fibrillation) (HCC)    a. documented post TAVR.  Precancerous skin lesion    (sees Dr. 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   29 mm Edwards Sapien 3 transcatheter heart valve placed via percutaneous right transfemoral approach    Tobacco History: Social History   Tobacco Use  Smoking Status Former   Current packs/day: 0.00   Average packs/day: 3.5 packs/day for 13.0 years (45.5 ttl pk-yrs)   Types: Cigarettes   Start date: 43   Quit date: 1963   Years since quitting: 62.6  Smokeless Tobacco Never  Tobacco Comments   Former smoker 04/19/21   Counseling given: Not Answered Tobacco comments: Former smoker 04/19/21   Outpatient Medications Prior to Visit  Medication Sig Dispense Refill   albuterol  (VENTOLIN  HFA) 108 (90 Base) MCG/ACT inhaler Inhale 2 puffs into the lungs every 4 (four) hours as needed for wheezing or shortness of breath. 8 g 5   allopurinol  (ZYLOPRIM ) 100 MG tablet Take 1 tablet (100 mg total) by mouth daily. 90 tablet 3    amiodarone  (PACERONE ) 200 MG tablet Take 1 tablet (200 mg total) by mouth daily. 90 tablet 3   aspirin  81 MG tablet Take 1 tablet (81 mg total) by mouth daily. 30 tablet 0   atorvastatin  (LIPITOR) 20 MG tablet TAKE ONE TABLET ONCE DAILY 90 tablet 2   Baclofen 5 MG TABS Take 5 mg by mouth every 12 (twelve) hours as needed.     calcium  carbonate (OS-CAL) 1250 (500 Ca) MG chewable tablet Chew 1 tablet by mouth daily.     Calcium -Magnesium -Zinc-Vit D3 (CALCIUM -MAGNESIUM -ZINC-D3 PO) Take 2 tablets by mouth daily.     cetirizine (ZYRTEC) 10 MG tablet Take 10 mg by mouth 2 (two) times daily. As needed     colchicine  0.6 MG tablet Take 1 tablet (0.6 mg total) by mouth every 6 (six) hours as needed (gout). 60 tablet 5   dabigatran  (PRADAXA ) 150 MG CAPS capsule TAKE (1) CAPSULE TWICE DAILY. 180 capsule 2   diclofenac  Sodium (VOLTAREN ) 1 % GEL Apply 4 g topically 3 (three) times daily as needed (pain). 50 g 0   docusate sodium  (COLACE) 100 MG capsule Take 100 mg by mouth 2 (two) times daily.     fluticasone  (FLONASE ) 50 MCG/ACT nasal spray Place 2 sprays into both nostrils daily.     insulin  aspart (NOVOLOG ) 100 UNIT/ML injection Inject 4-16 Units into the skin as directed. Inject as per sliding scale: if 141 - 180 = 4 units Inject per sliding scale; 181 - 220 = 6 units Inject per sliding scale; 221 - 260 = 8 units Inject per sliding scale; 261 - 300 = 10 units Inject per sliding scale; 301 - 350 = 12 units Inject per sliding scale; 351 - 400= 14 units Inject per sliding scale; 401+ = 16 units Inject per sliding scale, subcutaneously three times a day     isosorbide  mononitrate (IMDUR ) 30 MG 24 hr tablet Take 1 tablet (30 mg total) by mouth daily. 90 tablet 3   ketotifen  (ZADITOR ) 0.025 % ophthalmic solution Place 1 drop into both eyes daily as needed (for itching).     levothyroxine  (SYNTHROID ) 175 MCG tablet Take 175 mcg by mouth daily.     metoCLOPramide  (REGLAN ) 10 MG tablet Take 10 mg by mouth 2 (two)  times daily.     metolazone  (ZAROXOLYN ) 2.5 MG tablet Take 1 tablet (2.5 mg total) by mouth once a week  every Saturday 12 tablet 3   metoprolol  succinate (TOPROL -XL) 50 MG 24 hr tablet Take 50 mg by mouth daily. Take with or immediately following a meal.     montelukast  (SINGULAIR ) 10 MG tablet TAKE ONE TABLET BY MOUTH ONCE DAILY IN THE EVENING 90 tablet 3   Multiple Vitamins-Minerals (MULTI-VITAMIN GUMMIES) CHEW Chew 2 Pieces by mouth daily.     Multiple Vitamins-Minerals (PRESERVISION AREDS 2) CAPS Take 1 capsule by mouth in the morning and at bedtime.     OXYGEN Inhale 2 L into the lungs as directed. Resident to wear O2 when ambulating around the unit and during therapy every shift     pantoprazole  (PROTONIX ) 40 MG tablet Take 40 mg by mouth 2 (two) times daily.     polyethylene glycol powder (GLYCOLAX/MIRALAX) 17 GM/SCOOP powder Take 17 g by mouth daily as needed for moderate constipation.     Potassium Chloride  ER 20 MEQ TBCR Take 4 tablets (80 mEq total) by mouth 2 (two) times daily.     Probiotic Product (PROBIOTIC PO) Take 1 capsule by mouth daily.     sacubitril-valsartan (ENTRESTO ) 24-26 MG Take 0.5 tablets by mouth 2 (two) times daily.     spironolactone  (ALDACTONE ) 25 MG tablet Take 1 tablet (25 mg total) by mouth daily. 30 tablet 9   tamsulosin (FLOMAX) 0.4 MG CAPS capsule Take 0.4 mg by mouth daily.     Tiotropium Bromide Monohydrate  (SPIRIVA  RESPIMAT) 2.5 MCG/ACT AERS Inhale 2 puffs into the lungs daily.     torsemide  (DEMADEX ) 20 MG tablet Take 4 tablets (80 mg total) by mouth in the morning AND 2 tablets (40 mg total) every evening. 120 tablet 3   triamcinolone  cream (KENALOG ) 0.1 % Apply 1 application  topically daily as needed for dry skin (affected areas).     valACYclovir  (VALTREX ) 500 MG tablet Take 500 mg by mouth every 12 (twelve) hours as needed (cold sores).     venlafaxine  XR (EFFEXOR  XR) 37.5 MG 24 hr capsule Take 1 capsule (37.5 mg total) by mouth daily with breakfast.  30 capsule 1   Facility-Administered Medications Prior to Visit  Medication Dose Route Frequency Provider Last Rate Last Admin   azithromycin  (ZITHROMAX ) tablet 500 mg  500 mg Oral Daily        Review of Systems  Review of Systems  Constitutional: Negative.   HENT: Negative.    Respiratory:  Positive for shortness of breath. Negative for cough, chest tightness and wheezing.   Cardiovascular:  Positive for leg swelling.     Physical Exam  There were no vitals taken for this visit. Physical Exam Constitutional:      General: He is not in acute distress.    Appearance: Normal appearance. He is obese. He is not ill-appearing.  HENT:     Head: Normocephalic and atraumatic.  Cardiovascular:     Rate and Rhythm: Normal rate and regular rhythm.     Comments: Trace- +1 pitting edema BLE  Pulmonary:     Effort: Pulmonary effort is normal. No respiratory distress.     Breath sounds: Normal breath sounds. No wheezing or rhonchi.     Comments: Isolated rales at lung bases  Neurological:     General: No focal deficit present.     Mental Status: He is alert and oriented to person, place, and time. Mental status is at baseline.  Psychiatric:        Mood and Affect: Mood normal.  Behavior: Behavior normal.        Thought Content: Thought content normal.        Judgment: Judgment normal.      Lab Results:  CBC    Component Value Date/Time   WBC 17.7 (H) 01/20/2024 1702   RBC 5.03 01/20/2024 1702   HGB 14.0 01/20/2024 1702   HGB 14.0 05/23/2023 1220   HCT 44.2 01/20/2024 1702   HCT 45.2 05/23/2023 1220   PLT 345 01/20/2024 1702   PLT 415 05/23/2023 1220   MCV 87.9 01/20/2024 1702   MCV 87 05/23/2023 1220   MCH 27.8 01/20/2024 1702   MCHC 31.7 01/20/2024 1702   RDW 19.3 (H) 01/20/2024 1702   RDW 15.9 (H) 05/23/2023 1220   LYMPHSABS 2.9 01/20/2024 1702   LYMPHSABS 2.0 04/23/2017 1012   MONOABS 1.2 (H) 01/20/2024 1702   EOSABS 0.4 01/20/2024 1702   EOSABS 0.3  04/23/2017 1012   BASOSABS 0.1 01/20/2024 1702   BASOSABS 0.1 04/23/2017 1012    BMET    Component Value Date/Time   NA 132 (A) 03/25/2024 0000   K 4.0 03/25/2024 0000   CL 96 (A) 03/25/2024 0000   CO2 27 (A) 03/25/2024 0000   GLUCOSE 131 (H) 01/20/2024 1702   GLUCOSE 87 06/30/2006 1132   BUN 31 (A) 03/25/2024 0000   CREATININE 1.5 (A) 03/25/2024 0000   CREATININE 1.54 (H) 01/20/2024 1702   CREATININE 1.62 (H) 10/14/2023 0729   CALCIUM  8.1 (A) 03/25/2024 0000   GFRNONAA 43 (L) 01/20/2024 1702   GFRAA 66 04/04/2020 1132    BNP    Component Value Date/Time   BNP 823.5 (H) 01/16/2024 1223    ProBNP    Component Value Date/Time   PROBNP 2,293 (H) 10/16/2022 1259   PROBNP 354.0 (H) 03/28/2021 0730    Imaging: CUP PACEART INCLINIC DEVICE CHECK Result Date: 04/05/2024 Normal in-clinic CRT-D (multi-lead) check. Presenting Rhythm: AF/BiVP. Routine testing was performed. Thresholds, sensing, impedance trend were stable and no changes were required. HF diagnostics are stable. Ongoing AF since 05/30/23. Pt on Pradaxa . No treated arrhythmias. Patient BiV pacing 67% of the time (chronically low). Estimated longevity 6.5 years. Pt enrolled in remote follow-up. MD aware of low BiV pacing percentage and discussed with patient today possible treatment options to help improve that number. Changes made during today's visit: LV pulse width 0.4 mS ---> 0.8 mS LV output 3.0 V @ 0.4 mS ---> 2.5 V @0 .8 Lower Rate Limit 60 ---> 75Drew Brien, BSN, RN  US  Guided Needle Placement - No Linked Charges Result Date: 03/26/2024 Ultrasound imaging demonstrates needle placement deep to the iliotibial band with injection of fluid and no complication    Assessment & Plan:    1. Chronic systolic heart failure (HCC) (Primary) - B Nat Peptide - Basic Metabolic Panel (BMET)  2. OSA (obstructive sleep apnea)   Assessment and Plan Assessment & Plan Heart failure with fluid overload Recent weight gain of  approximately 10-13 pounds indicates fluid retention along with trace-+1 BLE pitting edema. Recent adjustment of diuretics by cardiologist, but fluid retention persists. No significant dietary changes reported. - Continue Torsemide  80mg  every morning and 40mg  every evening; spirolactone 25mg  daily and zaroxolyn  once a week on Saturday  - Order lab work to assess kidney function and BNP - Consider adjustment of diuretics based on lab results  Severe obstructive sleep apnea on CPAP and nocturnal oxygen Severe obstructive sleep apnea well-controlled with CPAP and nocturnal oxygen. Compliance with CPAP usage is  67%, needs to be over 70%. Recent break in therapy due to tooth extraction, but compliance has resumed. CPAP effectively reduces apneic events to less than one per hour. - Continue CPAP and nocturnal oxygen - Schedule virtual follow-up in 4-6 weeks to reassess compliance - Document tooth extraction as reason for break in therapy  Mild chronic obstructive pulmonary disease (COPD) Mild obstructive lung disease based on curvature to flow volume loop on spirometry, FEV1 80%. Former heavy smoker, quit 62 years ago. Currently using Spiriva  Respimat 2.5mcg once daily. No significant change in breathing or energy levels reported. - Continue Spiriva  Respimat 2.5mcg once daily for now. We can consider discontinue at follow-up if no improvement with daily BD. Dyspne largely related to heart failure and obesity.   Chronic hypoxemic respiratory failure on supplemental oxygen Chronic hypoxemic respiratory failure managed with supplemental oxygen. Oxygen used consistently with CPAP at night. Oxygen saturation levels are stable with deep breathing exercises. O2 97% RA today. Advised to use oxygen during moderate exertion to maintain O2 level >88-90% - Continue supplemental oxygen use, especially during exertion   Almarie LELON Ferrari, NP 04/14/2024

## 2024-04-14 NOTE — Patient Instructions (Signed)
  VISIT SUMMARY: Today, you had a follow-up appointment to discuss your respiratory status and CPAP compliance. We reviewed your recent breathing test, your use of Spiriva  Respimat, and your CPAP therapy. We also discussed your recent weight gain and fluid retention issues.  YOUR PLAN: -HEART FAILURE WITH FLUID OVERLOAD: Heart failure with fluid overload means your heart is not pumping blood as well as it should, causing fluid to build up in your body. You have gained about 13 pounds recently, which suggests fluid retention. We will order lab work to check your kidney function and BNP levels, and we will send a note to your primary care physician about these issues. Depending on the lab results, we may need to adjust your diuretics.  -SEVERE OBSTRUCTIVE SLEEP APNEA ON CPAP AND NOCTURNAL OXYGEN: Severe obstructive sleep apnea is a condition where your airway becomes blocked during sleep, causing breathing pauses. Your CPAP therapy is helping to control this, but your compliance needs to be over 70%. You had a break in therapy due to a tooth extraction but have since resumed. Continue using your CPAP and nocturnal oxygen, and we will have a virtual follow-up in 4-6 weeks to reassess your compliance.  -MILD CHRONIC OBSTRUCTIVE PULMONARY DISEASE (COPD): Mild COPD is a lung condition that makes it hard to breathe due to obstructed airflow. Your lung function is at 80%, and you are using Spiriva  Respimat once daily. Continue using this inhaler as prescribed.  -CHRONIC HYPOXEMIC RESPIRATORY FAILURE ON SUPPLEMENTAL OXYGEN: Chronic hypoxemic respiratory failure means your body is not getting enough oxygen, which is managed with supplemental oxygen. Your oxygen levels are stable with deep breathing exercises, but you should continue using supplemental oxygen, especially during physical activity.  INSTRUCTIONS: We will order lab work to assess your kidney function and BNP levels. Please continue using your CPAP  and nocturnal oxygen, and we will schedule a virtual follow-up in 4-6 weeks to reassess your compliance. Continue using Spiriva  Respimat once daily and use supplemental oxygen during exertion.  Follow-up 6 week virtual visit with [name] NP for CPAP compliance

## 2024-04-15 ENCOUNTER — Ambulatory Visit: Admitting: Orthopedic Surgery

## 2024-04-15 ENCOUNTER — Telehealth (HOSPITAL_COMMUNITY): Payer: Self-pay | Admitting: Cardiology

## 2024-04-15 DIAGNOSIS — M25561 Pain in right knee: Secondary | ICD-10-CM

## 2024-04-15 LAB — BASIC METABOLIC PANEL WITH GFR
BUN/Creatinine Ratio: 22 (ref 10–24)
BUN: 33 mg/dL — ABNORMAL HIGH (ref 8–27)
CO2: 24 mmol/L (ref 20–29)
Calcium: 8.3 mg/dL — ABNORMAL LOW (ref 8.6–10.2)
Chloride: 93 mmol/L — ABNORMAL LOW (ref 96–106)
Creatinine, Ser: 1.5 mg/dL — ABNORMAL HIGH (ref 0.76–1.27)
Glucose: 99 mg/dL (ref 70–99)
Potassium: 4.4 mmol/L (ref 3.5–5.2)
Sodium: 133 mmol/L — ABNORMAL LOW (ref 134–144)
eGFR: 45 mL/min/1.73 — ABNORMAL LOW (ref >59)

## 2024-04-15 LAB — BRAIN NATRIURETIC PEPTIDE: BNP: 489.4 pg/mL — ABNORMAL HIGH (ref 0.0–100.0)

## 2024-04-15 NOTE — Telephone Encounter (Signed)
 Per Manuelita F,PA Pt should take an additonal 40 meq of potassium today with metolazone    Pts daughter Waymond aware Damien RN with [facility] aware  Order faxed to [phone] and scanned to [email or web address]

## 2024-04-15 NOTE — Telephone Encounter (Signed)
 Patients daughter called to reports Weight gain Normal home weight 240-245 Weight at visit 253 with pulm 04/14/24  Reports SOB however SOB is always present,wears oxygen Reports BLE edema Denies CP or coughing  Seen EP recently and medication changes were made as well as pacemaker adjustments -daughter unsure or details  Diuretics are managed by [facility] Torsemide  80/40 Spiro 25   Diet and fluid restrictions are managed the best they can at [facility]  Daughter is concerned with weight gain as patient can accumulate fluid quickly Please advise

## 2024-04-15 NOTE — Telephone Encounter (Signed)
 Is he still taking metolazone  once a week on Saturday? Can have him take an extra dose today. Please confirm he is taking potassium, will need extra with metolazone . It was marked as not taking on 08/20

## 2024-04-16 ENCOUNTER — Ambulatory Visit: Payer: Self-pay | Admitting: Primary Care

## 2024-04-16 NOTE — Progress Notes (Signed)
 Office Visit Note   Patient: Justin [name]           Date of Birth: 06-08-1934           MRN: [id number] Visit Date: 04/15/2024 Requested by: Mast, Man X, NP [address] [place],  [place] [address] PCP: Mast, Man X, NP  Subjective: Chief Complaint  Patient presents with   Right Knee - Pain, Follow-up    HPI: Justin [name] is a 89 y.o. male who presents to the office reporting right knee pain.  He has had a CT scan which was unremarkable.  Last time he was in clinic he had an injection around the iliotibial band which gave him about 1-1/2 to 2 days of complete relief.  Has had recurrent symptoms since that time.  Does not really report much in the way of back pain.  No radicular symptoms..                ROS: All systems reviewed are negative as they relate to the chief complaint within the history of present illness.  Patient denies fevers or chills.  Assessment & Plan: Visit Diagnoses:  1. Right knee pain, unspecified chronicity     Plan: Impression is right knee pain with no actionable pathology present.  Will continue to observe.  Iliotibial band stretching with physical therapy requested that his assisted living facility.  Follow-Up Instructions: No follow-ups on file.   Orders:  No orders of the defined types were placed in this encounter.  No orders of the defined types were placed in this encounter.     Procedures: No procedures performed   Clinical Data: No additional findings.  Objective: Vital Signs: There were no vitals taken for this visit.  Physical Exam:  Constitutional: Patient appears well-developed HEENT:  Head: Normocephalic Eyes:EOM are normal Neck: Normal range of motion Cardiovascular: Normal rate Pulmonary/chest: Effort normal Neurologic: Patient is alert Skin: Skin is warm Psychiatric: Patient has normal mood and affect  Ortho Exam: Ortho exam demonstrates no coarseness or grinding with active or passive motion of the right  knee.  Does have a little lateral sided tenderness no patellar apprehension collaterals are stable no effusion or warmth in that right knee.  Specialty Comments:  No specialty comments available.  Imaging: No results found.   PMFS History: Patient Active Problem List   Diagnosis Date Noted   Hyponatremia 02/16/2024   UTI (urinary tract infection) 12/19/2023   Recurrent depression (HCC) 12/19/2023   Candidiasis 12/15/2023   Pleural effusion 11/20/2023   Asymptomatic bacteriuria 11/20/2023   Viral pneumonia 11/19/2023   Acute hypoxemic respiratory failure (HCC) 11/18/2023   Coronavirus infection 11/18/2023   Acute respiratory failure with hypoxia (HCC) 11/17/2023   Bronchitis 11/06/2023   Type 2 diabetes mellitus (HCC) 10/23/2023   Leukocytosis 10/16/2023   MDD (major depressive disorder) 10/09/2023   Permanent atrial fibrillation (HCC) 09/01/2023   Right ventricular dysfunction 07/05/2023   Biventricular cardiac pacemaker in situ 07/05/2023   Chronic kidney disease, stage 3a (HCC) 07/05/2023   Persistent atrial fibrillation (HCC) 07/05/2023   Long term (current) use of anticoagulants 07/05/2023   Hx of CABG 07/05/2023   Atherosclerosis of native coronary artery of native heart without angina pectoris 07/05/2023   Acute exacerbation of congestive heart failure (HCC) 06/30/2023   NSVT (nonsustained ventricular tachycardia) (HCC) 05/23/2022   Sinus node dysfunction (HCC) 05/23/2022   Frequent PVCs 05/23/2022   Acute on chronic combined systolic and diastolic heart failure (HCC) 03/12/2022  Biventricular implantable cardioverter-defibrillator (ICD) in situ 03/12/2022   Atypical atrial flutter (HCC) 01/30/2022   Paroxysmal atrial fibrillation (HCC)    Secondary hypercoagulable state (HCC) 04/19/2021   Raynaud's phenomenon without gangrene 07/11/2020   Vertigo 07/11/2020   Loosening of knee joint prosthesis (HCC) 05/18/2019   Cough 04/30/2019   Gastroesophageal reflux disease  04/30/2019   S/P ICD (internal cardiac defibrillator) procedure 05/03/2017   NICM (nonischemic cardiomyopathy) (HCC) 05/02/2017   History of transcatheter aortic valve replacement (TAVR) 03/04/2017   Severe aortic valve stenosis 03/04/2017   Hypokalemia due to loss of potassium with diuresis 01/10/2017   Aortic stenosis 01/10/2017   Chronic systolic CHF (congestive heart failure) (HCC) 01/10/2017   Chronic congestive heart failure (HCC) 01/07/2017   Hypothyroidism 12/23/2016   Prostate cancer (HCC)    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   Edema of both legs 09/01/2014   Asthma, intermittent 09/01/2014   Epiphora due to insufficient drainage 07/02/2011   Rosacea blepharoconjunctivitis 07/02/2011   Coronary artery disease involving native coronary artery of native heart without angina pectoris    GOUT 09/12/2009   CAD, AUTOLOGOUS BYPASS GRAFT 05/10/2009   MURMUR 05/08/2009   BRUIT 05/08/2009   MUSCLE CRAMPS 12/12/2008   KNEE SPRAIN 09/21/2008   Osteoarthritis 07/07/2008   BPH with urinary obstruction 02/01/2008   LOW BACK PAIN SYNDROME 02/01/2008   TESTOSTERONE DEFICIENCY 07/03/2007   Hyperlipidemia 07/03/2007   Essential hypertension 07/03/2007   MYOCARDIAL INFARCTION, HX OF 07/03/2007   Allergic rhinitis 07/03/2007   History of colonic polyps 07/03/2007   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   Past Medical History:  Diagnosis Date   Age-related macular degeneration, dry, both eyes    Allergic    24/7; 365 days/year; I'm allergic to pollens, dust, all southern grasses/trees, mold, mildue, cats, dogs (01/07/2017)   Anal fissure    Asthma    sees Dr. Alaine    Benign prostatic hypertrophy    (sees Dr. Colan   CAD (coronary artery disease)    a. s/p CABG 2007. b. Cath 08/2016 - 4/4 patent grafts.   Carotid bruit    carotid u/s 10/10: 0.39% bilaterally   Chronic combined systolic and diastolic CHF (congestive heart failure) (HCC)    Complication of anesthesia 1980s   w/anal  cyst OR, he gave me a saddle block then put a narcotic in spinal cord; had a severe reaction to that (01/07/2017)   Congestive heart failure (CHF) [facility])    ED (erectile dysfunction)    Family history of adverse reaction to anesthesia    daughter wakes up during OR (01/07/2017)   GERD (gastroesophageal reflux disease)    Gout    HTN (hypertension)    Hx of colonic polyps    (sees Dr. Abran)   Hyperlipidemia    Hypothyroidism    Moderate to severe aortic stenosis    a. s/p TAVR 02/2017.   Myocardial infarction [facility]) ~ 2000   Obesity    Osteoarthritis    was in my knees, hands (01/07/2017 )   PAF (paroxysmal atrial fibrillation) (HCC)    a. documented post TAVR.   Precancerous skin lesion    (sees Dr. 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   29 mm Edwards Sapien 3 transcatheter heart valve placed via percutaneous right transfemoral approach    Family History  Problem Relation Age of Onset   Heart attack Father 109  Allergic rhinitis Father    Asthma Father    Heart failure Mother 29   Uterine cancer Mother    Breast cancer Mother    Colon cancer Neg Hx    Esophageal cancer Neg Hx     Past Surgical History:  Procedure Laterality Date   ANUS SURGERY     opened it back up cause it wouldn't heal; wound up w/a fissure (01/07/2017)   BIV ICD INSERTION CRT-D N/A 05/02/2017   Procedure: BIV ICD INSERTION CRT-D;  Surgeon: [name], MD;  Location: [facility] INVASIVE CV LAB;  Service: Cardiovascular;  Laterality: N/A;   CARDIAC CATHETERIZATION  10/29/2005   CARDIAC CATHETERIZATION N/A 08/27/2016   Procedure: Right/Left Heart Cath and Coronary/Graft Angiography;  Surgeon: Lonni JONETTA Cash, MD;  Location: [facility] INVASIVE CV LAB;  Service: Cardiovascular;  Laterality: N/A;   CARDIOVERSION N/A 04/24/2021   Procedure: CARDIOVERSION;  Surgeon: Okey Vina GAILS, MD;  Location: [facility] ENDOSCOPY;  Service:  Cardiovascular;  Laterality: N/A;   CARDIOVERSION N/A 11/08/2021   Procedure: CARDIOVERSION;  Surgeon: Mona Vinie BROCKS, MD;  Location: [facility] ENDOSCOPY;  Service: Cardiovascular;  Laterality: N/A;   CARDIOVERSION N/A 02/06/2022   Procedure: CARDIOVERSION;  Surgeon: Delford Maude BROCKS, MD;  Location: [facility] ENDOSCOPY;  Service: Cardiovascular;  Laterality: N/A;   CATARACT EXTRACTION W/ INTRAOCULAR LENS  IMPLANT, BILATERAL Bilateral    CATARACT EXTRACTION, BILATERAL  2012   COLONOSCOPY  06/30/2008   no repeats needed    COLONOSCOPY     had 3 or 4 in the past    CORONARY ARTERY BYPASS GRAFT  2007   CABG X4   CYST EXCISION PERINEAL  1980s   HAMMER TOE SURGERY Bilateral    ICD GENERATOR CHANGEOUT N/A 05/30/2023   Procedure: ICD GENERATOR CHANGEOUT;  Surgeon: [name], MD;  Location: [facility] INVASIVE CV LAB;  Service: Cardiovascular;  Laterality: N/A;   JOINT REPLACEMENT     KNEE ARTHROPLASTY  07/30/2011   Procedure: COMPUTER ASSISTED TOTAL KNEE ARTHROPLASTY;  Surgeon: Cordella Glendia Hutchinson;  Location: [facility] OR;  Service: Orthopedics;  Laterality: Left;  left total knee arthroplasty   MASTECTOMY SUBCUTANEOUS Bilateral    MULTIPLE TOOTH EXTRACTIONS     ORIF FINGER / THUMB FRACTURE Right ~ 1980   repair of thumb injury   PROSTATE BIOPSY     REPLACEMENT TOTAL KNEE BILATERAL Bilateral 2012   TEE WITHOUT CARDIOVERSION N/A 03/04/2017   Procedure: TRANSESOPHAGEAL ECHOCARDIOGRAM (TEE);  Surgeon: Cash Lonni JONETTA, MD;  Location: [facility] OR;  Service: Open Heart Surgery;  Laterality: N/A;   TOTAL KNEE REVISION Right 05/18/2019   Procedure: RIGHT PATELLA REVISION/REMOVAL;  Surgeon: Hutchinson Cordella Glendia, MD;  Location: [facility] OR;  Service: Orthopedics;  Laterality: Right;   TRANSCATHETER AORTIC VALVE REPLACEMENT, TRANSFEMORAL N/A 03/04/2017   Procedure: TRANSCATHETER AORTIC VALVE REPLACEMENT, TRANSFEMORAL;  Surgeon: Cash Lonni JONETTA, MD;  Location: [facility] OR;  Service: Open Heart Surgery;  Laterality: N/A;   Social  History   Occupational History   Occupation: Product/process development scientist    Comment: builds malls  Tobacco Use   Smoking status: Former    Current packs/day: 0.00    Average packs/day: 3.5 packs/day for 13.0 years (45.5 ttl pk-yrs)    Types: Cigarettes    Start date: 81    Quit date: 1963    Years since quitting: 62.6   Smokeless tobacco: Never   Tobacco comments:    Former smoker 04/19/21  Vaping Use   Vaping status: Never Used  Substance and Sexual Activity  Alcohol use: Not Currently    Alcohol/week: 7.0 standard drinks of alcohol    Types: 7 Cans of beer per week    Comment: 1 beer daily after 5pm (04/19/21)   Drug use: No   Sexual activity: Never

## 2024-04-16 NOTE — Progress Notes (Signed)
 Please let patient know BNP was 489, recommend he reach out to cardiology to re-adjust his diuretics. Kidney function is elevated but baseline. Let me know if they need guidance in anyway

## 2024-04-17 ENCOUNTER — Encounter: Payer: Self-pay | Admitting: Orthopedic Surgery

## 2024-04-23 ENCOUNTER — Non-Acute Institutional Stay: Payer: Self-pay | Admitting: Nurse Practitioner

## 2024-04-23 DIAGNOSIS — I484 Atypical atrial flutter: Secondary | ICD-10-CM

## 2024-04-23 DIAGNOSIS — F339 Major depressive disorder, recurrent, unspecified: Secondary | ICD-10-CM

## 2024-04-23 DIAGNOSIS — D72829 Elevated white blood cell count, unspecified: Secondary | ICD-10-CM | POA: Diagnosis not present

## 2024-04-23 DIAGNOSIS — E039 Hypothyroidism, unspecified: Secondary | ICD-10-CM | POA: Diagnosis not present

## 2024-04-23 DIAGNOSIS — E871 Hypo-osmolality and hyponatremia: Secondary | ICD-10-CM | POA: Diagnosis not present

## 2024-04-23 DIAGNOSIS — E785 Hyperlipidemia, unspecified: Secondary | ICD-10-CM

## 2024-04-23 DIAGNOSIS — N1831 Chronic kidney disease, stage 3a: Secondary | ICD-10-CM | POA: Diagnosis not present

## 2024-04-23 DIAGNOSIS — I10 Essential (primary) hypertension: Secondary | ICD-10-CM

## 2024-04-23 DIAGNOSIS — M150 Primary generalized (osteo)arthritis: Secondary | ICD-10-CM | POA: Diagnosis not present

## 2024-04-23 DIAGNOSIS — Z951 Presence of aortocoronary bypass graft: Secondary | ICD-10-CM

## 2024-04-23 DIAGNOSIS — M1A0490 Idiopathic chronic gout, unspecified hand, without tophus (tophi): Secondary | ICD-10-CM | POA: Diagnosis not present

## 2024-04-23 DIAGNOSIS — K219 Gastro-esophageal reflux disease without esophagitis: Secondary | ICD-10-CM

## 2024-04-23 DIAGNOSIS — I5022 Chronic systolic (congestive) heart failure: Secondary | ICD-10-CM | POA: Diagnosis not present

## 2024-04-23 NOTE — Progress Notes (Signed)
 Location:   [facility] Room Number: 913 Place of Service:  ALF (13) Provider: Larwance Masson Nalepa NP  Merin Borjon X, NP  Patient Care Team: Donye Campanelli X, NP as PCP - General (Internal Medicine) [name], MD as PCP - Cardiology (Cardiology) [name], MD as PCP - Electrophysiology (Cardiology) Nieves Cough, MD as Consulting Physician (Urology) Liane Sharyne MATSU, [facility] (Inactive) as Pharmacist (Pharmacist) Leslee Reusing, MD as Consulting Physician (Ophthalmology)  Extended Emergency Contact Information Primary Emergency Contact: [facility] Address: [address]          [place], [place] [address] [place] Phone: [phone] Relation: Daughter Secondary Emergency Contact: Billie Risen Address: [address]          [place], [place] [address] [place] Home Phone: [phone] Mobile Phone: [phone] Relation: Daughter  Code Status: DNR Goals of care: Advanced Directive information    02/16/2024   11:59 AM  Advanced Directives  Does Patient Have a Medical Advance Directive? Yes  Type of Advance Directive Living will;Healthcare Power of Attorney  Does patient want to make changes to medical advance directive? No - Patient declined  Copy of Healthcare Power of Attorney in Chart? Yes - validated most recent copy scanned in chart (See row information)     Chief Complaint  Patient presents with   Acute Visit    Left knee pain    HPI:  Pt is a 89 y.o. male seen today for an acute visit for the right  knee pain, reported severe pain last night, topical Voltaren  gel was not effective, declined Tylenol  due to concern of liver function, wants to resume oxycodone  as needed.  The patient stated his pain is much better today.  The patient and his H POA declined x-ray and desire following up with orthopedics.  No redness, swelling, signs symptoms of trauma, or deformity in the R knee.   OA, s/p left  replacement, chronic left knee pain, followed by orthopedics, ambulates with walker, risk of falling. `            UTI, treated with Fosfomycin by Urology, 01/16/24 urology Fosfomycin 3gm po q48hr x 2 wks.               Hospitalized 11/17/23-11/27/23 for hypoxic respiratory failure COVID/PNA and acute HRmrEF, left pleural effusion-s/p thoracentesis with 1000cc of bloody fluid removed exudative by LDH,  improved SOB, Orthopnea, and edema BLE, treated with Prednisone , bronchodilators, antibiotics, and diuretics. Underwent evaluation of pulmonology, cardiology.              12/15/23 CXR mild CHF, no gross focal lund consolidation, pleural effusion, pneumothorax             Leukocytosis, chronic, wbc 17.7, neutrophils 73% 01/20/24             Gout, no flare up, R+L wrist, takes Allopurinol , colchicine ,  uric acid 7.6 10/10/22             Afib, s/p pace maker, defibrillator , on Amiodarone , Pradaxa , Metoprolol , followed by cardiology.              HLD on ASA, Atorvastatin , LDL 55 10/14/23             CAD, Hx of CABF, TAVR, no chest pain, taking Isosorbide              CHF, mild edema BLE, RLE>LLE, off Farxiga  due to recurrent UTI, on Entresto , Metoprolol , Metolazone , Spironolactone , Torsemide , BNP 824 01/16/24>>489.5  04/14/24             Hyponatremia, Na 133 04/14/24             CKD Bun/creat 33/1.5 04/14/24             Hypothyroidism, on Levothyroxine , TSH 2.24 03/16/24             HTN, blood pressure is controlled, on Entresto , Metoprolol , diuretics.              GERD, stable, on Omeprazole , Hx of Reglan  use, Hgb 14 01/20/24             Depression, better with Effexor , failed Zoloft , feels sleeps too much and depressed.               T2DM, Hgb A1c 6.8 10/14/23, on insulin     Past Medical History:  Diagnosis Date   Age-related macular degeneration, dry, both eyes    Allergic    24/7; 365 days/year; I'm allergic to pollens, dust, all southern grasses/trees, mold, mildue, cats, dogs (01/07/2017)   Anal fissure     Asthma    sees Dr. Alaine    Benign prostatic hypertrophy    (sees Dr. Colan   CAD (coronary artery disease)    a. s/p CABG 2007. b. Cath 08/2016 - 4/4 patent grafts.   Carotid bruit    carotid u/s 10/10: 0.39% bilaterally   Chronic combined systolic and diastolic CHF (congestive heart failure) (HCC)    Complication of anesthesia 1980s   w/anal cyst OR, he gave me a saddle block then put a narcotic in spinal cord; had a severe reaction to that (01/07/2017)   Congestive heart failure (CHF) [facility])    ED (erectile dysfunction)    Family history of adverse reaction to anesthesia    daughter wakes up during OR (01/07/2017)   GERD (gastroesophageal reflux disease)    Gout    HTN (hypertension)    Hx of colonic polyps    (sees Dr. Abran)   Hyperlipidemia    Hypothyroidism    Moderate to severe aortic stenosis    a. s/p TAVR 02/2017.   Myocardial infarction [facility]) ~ 2000   Obesity    Osteoarthritis    was in my knees, hands (01/07/2017 )   PAF (paroxysmal atrial fibrillation) (HCC)    a. documented post TAVR.   Precancerous skin lesion    (sees Dr. 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   29 mm Edwards Sapien 3 transcatheter heart valve placed via percutaneous right transfemoral approach   Past Surgical History:  Procedure Laterality Date   ANUS SURGERY     opened it back up cause it wouldn't heal; wound up w/a fissure (01/07/2017)   BIV ICD INSERTION CRT-D N/A 05/02/2017   Procedure: BIV ICD INSERTION CRT-D;  Surgeon: [name], MD;  Location: [facility] INVASIVE CV LAB;  Service: Cardiovascular;  Laterality: N/A;   CARDIAC CATHETERIZATION  10/29/2005   CARDIAC CATHETERIZATION N/A 08/27/2016   Procedure: Right/Left Heart Cath and Coronary/Graft Angiography;  Surgeon: Lonni JONETTA Cash, MD;  Location: [facility] INVASIVE CV LAB;  Service: Cardiovascular;  Laterality: N/A;   CARDIOVERSION N/A  04/24/2021   Procedure: CARDIOVERSION;  Surgeon: Okey Vina GAILS, MD;  Location: [facility] ENDOSCOPY;  Service: Cardiovascular;  Laterality: N/A;   CARDIOVERSION N/A 11/08/2021   Procedure: CARDIOVERSION;  Surgeon: Mona Vinie BROCKS, MD;  Location:  [facility] ENDOSCOPY;  Service: Cardiovascular;  Laterality: N/A;   CARDIOVERSION N/A 02/06/2022   Procedure: CARDIOVERSION;  Surgeon: Delford Maude BROCKS, MD;  Location: [facility] ENDOSCOPY;  Service: Cardiovascular;  Laterality: N/A;   CATARACT EXTRACTION W/ INTRAOCULAR LENS  IMPLANT, BILATERAL Bilateral    CATARACT EXTRACTION, BILATERAL  2012   COLONOSCOPY  06/30/2008   no repeats needed    COLONOSCOPY     had 3 or 4 in the past    CORONARY ARTERY BYPASS GRAFT  2007   CABG X4   CYST EXCISION PERINEAL  1980s   HAMMER TOE SURGERY Bilateral    ICD GENERATOR CHANGEOUT N/A 05/30/2023   Procedure: ICD GENERATOR CHANGEOUT;  Surgeon: [name], MD;  Location: [facility] INVASIVE CV LAB;  Service: Cardiovascular;  Laterality: N/A;   JOINT REPLACEMENT     KNEE ARTHROPLASTY  07/30/2011   Procedure: COMPUTER ASSISTED TOTAL KNEE ARTHROPLASTY;  Surgeon: Cordella Glendia Hutchinson;  Location: [facility] OR;  Service: Orthopedics;  Laterality: Left;  left total knee arthroplasty   MASTECTOMY SUBCUTANEOUS Bilateral    MULTIPLE TOOTH EXTRACTIONS     ORIF FINGER / THUMB FRACTURE Right ~ 1980   repair of thumb injury   PROSTATE BIOPSY     REPLACEMENT TOTAL KNEE BILATERAL Bilateral 2012   TEE WITHOUT CARDIOVERSION N/A 03/04/2017   Procedure: TRANSESOPHAGEAL ECHOCARDIOGRAM (TEE);  Surgeon: [name], MD;  Location: [facility] OR;  Service: Open Heart Surgery;  Laterality: N/A;   TOTAL KNEE REVISION Right 05/18/2019   Procedure: RIGHT PATELLA REVISION/REMOVAL;  Surgeon: Hutchinson Cordella Glendia, MD;  Location: [facility] OR;  Service: Orthopedics;  Laterality: Right;   TRANSCATHETER AORTIC VALVE REPLACEMENT, TRANSFEMORAL N/A 03/04/2017   Procedure: TRANSCATHETER AORTIC VALVE REPLACEMENT, TRANSFEMORAL;  Surgeon:  [name], MD;  Location: [facility] OR;  Service: Open Heart Surgery;  Laterality: N/A;    Allergies  Allergen Reactions   Peanut-Containing Drug Products Anaphylaxis   Sulfonamide Derivatives Anaphylaxis   Amlodipine  Swelling and Other (See Comments)    Swelling in ankles   Eliquis  [Apixaban ] Other (See Comments)    Back/hip pain   Lisinopril  Cough   Xarelto  [Rivaroxaban ] Other (See Comments)    Back/hip pain    Allergies as of 04/23/2024       Reactions   Peanut-containing Drug Products Anaphylaxis   Sulfonamide Derivatives Anaphylaxis   Amlodipine  Swelling, Other (See Comments)   Swelling in ankles   Eliquis  [apixaban ] Other (See Comments)   Back/hip pain   Lisinopril  Cough   Xarelto  [rivaroxaban ] Other (See Comments)   Back/hip pain        Medication List        Accurate as of April 23, 2024 11:59 PM. If you have any questions, ask your nurse or doctor.          STOP taking these medications    metoprolol  succinate 50 MG 24 hr tablet Commonly known as: TOPROL -XL   potassium chloride  SA 20 MEQ tablet Commonly known as: KLOR-CON  M       TAKE these medications    albuterol  108 (90 Base) MCG/ACT inhaler Commonly known as: VENTOLIN  HFA Inhale 2 puffs into the lungs every 4 (four) hours as needed for wheezing or shortness of breath.   allopurinol  100 MG tablet Commonly known as: ZYLOPRIM  Take 1 tablet (100 mg total) by mouth daily.   amiodarone  200 MG tablet Commonly known as: PACERONE  Take 1 tablet (200 mg total) by mouth daily.   aspirin  81 MG tablet Take 1 tablet (81  mg total) by mouth daily.   atorvastatin  20 MG tablet Commonly known as: LIPITOR TAKE ONE TABLET ONCE DAILY   Baclofen 5 MG Tabs Take 5 mg by mouth every 12 (twelve) hours as needed.   calcium  carbonate 1250 (500 Ca) MG chewable tablet Commonly known as: OS-CAL Chew 1 tablet by mouth daily.   CALCIUM -MAGNESIUM -ZINC-D3 PO Take 2 tablets by mouth daily.    cetirizine 10 MG tablet Commonly known as: ZYRTEC Take 10 mg by mouth 2 (two) times daily. As needed   colchicine  0.6 MG tablet Take 1 tablet (0.6 mg total) by mouth every 6 (six) hours as needed (gout).   dabigatran  150 MG Caps capsule Commonly known as: Pradaxa  TAKE (1) CAPSULE TWICE DAILY.   diclofenac  Sodium 1 % Gel Commonly known as: Voltaren  Apply 4 g topically 3 (three) times daily as needed (pain).   docusate sodium  100 MG capsule Commonly known as: COLACE Take 100 mg by mouth 2 (two) times daily.   Entresto  24-26 MG Generic drug: sacubitril-valsartan Take 0.5 tablets by mouth 2 (two) times daily.   fluticasone  50 MCG/ACT nasal spray Commonly known as: FLONASE  Place 2 sprays into both nostrils daily.   insulin  aspart 100 UNIT/ML injection Commonly known as: novoLOG  Inject 4-16 Units into the skin as directed. Inject as per sliding scale: if 141 - 180 = 4 units Inject per sliding scale; 181 - 220 = 6 units Inject per sliding scale; 221 - 260 = 8 units Inject per sliding scale; 261 - 300 = 10 units Inject per sliding scale; 301 - 350 = 12 units Inject per sliding scale; 351 - 400= 14 units Inject per sliding scale; 401+ = 16 units Inject per sliding scale, subcutaneously three times a day   isosorbide  mononitrate 30 MG 24 hr tablet Commonly known as: IMDUR  Take 1 tablet (30 mg total) by mouth daily.   ketotifen  0.025 % ophthalmic solution Commonly known as: ZADITOR  Place 1 drop into both eyes daily as needed (for itching).   levothyroxine  175 MCG tablet Commonly known as: SYNTHROID  Take 175 mcg by mouth daily.   metoCLOPramide  10 MG tablet Commonly known as: REGLAN  Take 10 mg by mouth 2 (two) times daily.   metolazone  2.5 MG tablet Commonly known as: ZAROXOLYN  Take 1 tablet (2.5 mg total) by mouth once a week every Saturday   montelukast  10 MG tablet Commonly known as: SINGULAIR  TAKE ONE TABLET BY MOUTH ONCE DAILY IN THE EVENING   Multi-Vitamin Gummies  Chew Chew 2 Pieces by mouth daily.   PreserVision AREDS 2 Caps Take 1 capsule by mouth in the morning and at bedtime.   OXYGEN Inhale 2 L into the lungs as directed. Resident to wear O2 when ambulating around the unit and during therapy every shift   pantoprazole  40 MG tablet Commonly known as: PROTONIX  Take 40 mg by mouth 2 (two) times daily.   polyethylene glycol powder 17 GM/SCOOP powder Commonly known as: GLYCOLAX/MIRALAX Take 17 g by mouth daily as needed for moderate constipation.   Potassium Chloride  ER 20 MEQ Tbcr Take 4 tablets (80 mEq total) by mouth 2 (two) times daily.   PROBIOTIC PO Take 1 capsule by mouth daily.   Spiriva  Respimat 2.5 MCG/ACT Aers Generic drug: Tiotropium Bromide Monohydrate  Inhale 2 puffs into the lungs daily.   spironolactone  25 MG tablet Commonly known as: ALDACTONE  Take 1 tablet (25 mg total) by mouth daily.   tamsulosin 0.4 MG Caps capsule Commonly known as: FLOMAX Take 0.4 mg  by mouth daily.   torsemide  20 MG tablet Commonly known as: DEMADEX  Take 4 tablets (80 mg total) by mouth in the morning AND 2 tablets (40 mg total) every evening.   triamcinolone  cream 0.1 % Commonly known as: KENALOG  Apply 1 application  topically daily as needed for dry skin (affected areas).   valACYclovir  500 MG tablet Commonly known as: VALTREX  Take 500 mg by mouth every 12 (twelve) hours as needed (cold sores).   venlafaxine  XR 37.5 MG 24 hr capsule Commonly known as: Effexor  XR Take 1 capsule (37.5 mg total) by mouth daily with breakfast.        Review of Systems  Constitutional:  Negative for appetite change, fatigue and fever.  HENT:  Negative for congestion and trouble swallowing.   Eyes:  Negative for visual disturbance.  Respiratory:  Negative for cough, shortness of breath and wheezing.   Cardiovascular:  Positive for leg swelling. Negative for chest pain and palpitations.  Gastrointestinal:  Negative for abdominal pain and  constipation.  Genitourinary:  Positive for frequency. Negative for dysuria and urgency.       Nocturnal urination about every 2-3 hours.   Musculoskeletal:  Positive for arthralgias and gait problem.       S/p L knee replacement, feels weak and clicks, left knee pain is chronic  Skin:  Positive for wound.       Multiple skin tears left forearm, back of hand  Neurological:  Negative for dizziness, speech difficulty and weakness.  Psychiatric/Behavioral:  Negative for confusion, dysphoric mood and sleep disturbance. The patient is not nervous/anxious.     Immunization History  Administered Date(s) Administered   Fluad Quad(high Dose 65+) 05/06/2019, 05/30/2020, 06/18/2021, 05/27/2023   Hep A / Hep B 04/06/2012, 10/07/2012   Hepatitis B 05/07/2012   INFLUENZA, HIGH DOSE SEASONAL PF 05/15/2018, 05/31/2022   Influenza Split 06/28/2013   Influenza Whole 06/05/2007, 05/13/2008, 06/06/2009, 05/30/2010   Influenza,inj,Quad PF,6+ Mos 05/20/2016   Influenza-Unspecified 05/25/2014, 07/08/2015, 05/06/2017, 07/29/2023   Moderna Covid-19 Vaccine Bivalent Booster 67yrs & up 05/31/2022   PFIZER(Purple Top)SARS-COV-2 Vaccination 09/28/2019, 10/12/2019, 05/30/2020   PPD Test 03/31/2023   Pfizer Covid-19 Vaccine Bivalent Booster 51yrs & up 11/18/2021, 07/29/2023   Pneumococcal Conjugate-13 09/18/2015   Pneumococcal Polysaccharide-23 05/13/2008, 05/25/2014   Td 05/30/2010   Tdap 10/27/2020   Zoster Recombinant(Shingrix) 05/18/2018, 10/16/2018   Zoster, Live 10/21/2007   Pertinent  Health Maintenance Due  Topic Date Due   INFLUENZA VACCINE  03/26/2024   HEMOGLOBIN A1C  04/12/2024   OPHTHALMOLOGY EXAM  11/06/2024   FOOT EXAM  04/05/2025      03/31/2023   10:48 AM 10/09/2023    1:31 PM 11/13/2023    1:50 PM 12/31/2023    8:52 AM 02/10/2024    9:05 AM  Fall Risk  Falls in the past year? 0 0 0 0 0  Was there an injury with Fall? 0 0 0  0  Fall Risk Category Calculator 0 0 0  0  Patient at Risk for  Falls Due to No Fall Risks History of fall(s) Impaired balance/gait;Impaired mobility    Fall risk Follow up Falls evaluation completed Falls evaluation completed Falls evaluation completed     Functional Status Survey:    Vitals:   04/23/24 1327  BP: 120/65  Pulse: 70  Resp: 20  Temp: 97.6 F (36.4 C)  SpO2: 97%  Weight: 244 lb 6.4 oz (110.9 kg)   Body mass index is 38.86 kg/m. Physical Exam Vitals and  nursing note reviewed.  Constitutional:      Appearance: He is obese.  HENT:     Head: Normocephalic and atraumatic.     Nose: Nose normal.  Eyes:     Extraocular Movements: Extraocular movements intact.     Conjunctiva/sclera: Conjunctivae normal.     Pupils: Pupils are equal, round, and reactive to light.  Cardiovascular:     Rate and Rhythm: Normal rate. Rhythm irregular.     Heart sounds: No murmur heard.    Comments: Left upper chest pace maker, defibrillator  Pulmonary:     Effort: Pulmonary effort is normal.     Breath sounds: Rales present.     Comments: Right base rales.  Abdominal:     General: Bowel sounds are normal.     Palpations: Abdomen is soft.     Tenderness: There is no abdominal tenderness. There is no right CVA tenderness, left CVA tenderness, guarding or rebound.  Musculoskeletal:        General: Tenderness present.     Cervical back: Normal range of motion and neck supple.     Right lower leg: Edema present.     Left lower leg: Edema present.     Comments: trace edema BLE No redness, warmth, deformity, or swelling R knee Pain was in the lateral aspect, better now Noted baker's cyst R popliteal area.   Skin:    General: Skin is warm and dry.     Findings: Bruising present.     Comments: Left fore arm, left dorsum of hand multiple skin tears, closed with steri strips, no s/s of infection or active bleeding   Neurological:     General: No focal deficit present.     Mental Status: He is alert and oriented to person, place, and time. Mental  status is at baseline.     Motor: No weakness.     Gait: Gait abnormal.  Psychiatric:        Mood and Affect: Mood normal.        Behavior: Behavior normal.        Thought Content: Thought content normal.        Judgment: Judgment normal.     Comments: Stated he feels difficulty adjusting to [facility]      Labs reviewed: Recent Labs    06/30/23 1616 07/01/23 0355 07/02/23 0415 11/25/23 0728 11/26/23 0417 11/27/23 0410 12/16/23 0000 12/23/23 1208 01/20/24 1702 03/25/24 0000 04/14/24 1637  NA  --  140   < > 134* 134* 134*   < > 132* 129* 132* 133*  K  --  4.1   < > 4.0 3.8 3.8   < > 3.7 3.4* 4.0 4.4  CL  --  106   < > 98 97* 96*   < > 93* 91* 96* 93*  CO2  --  24   < > 27 27 29    < > 26 26 27* 24  GLUCOSE  --  125*   < > 87 94 106*  --  92 131*  --  99  BUN  --  14   < > 41* 43* 46*   < > 20 32* 31* 33*  CREATININE  --  1.30*   < > 1.13 1.11 1.32*   < > 1.58* 1.54* 1.5* 1.50*  CALCIUM   --  7.8*   < > 7.8* 7.8* 7.7*   < > 8.1* 8.3* 8.1* 8.3*  MG 2.7*  --    < >  2.7* 2.6* 2.5*  --   --   --   --   --   PHOS 2.3* 3.3  --   --   --   --   --   --   --   --   --    < > = values in this interval not displayed.   Recent Labs    11/18/23 1918 11/19/23 1844 11/20/23 1317 12/16/23 0000 01/20/24 1702 03/16/24 0000  AST 76*  --  93* 33 66* 26  ALT 45*  --  70* 33 31 15  ALKPHOS 147*  --  151* 160* 130* 155*  BILITOT 0.8  --  0.8  --  0.7  --   PROT 6.7 7.3 6.5  --  6.9  --   ALBUMIN  2.1*  --  2.1* 2.4* 2.6* 3.0*   Recent Labs    11/26/23 0417 11/27/23 0410 12/16/23 0000 01/20/24 1702  WBC 19.1* 20.4* 9.7 17.7*  NEUTROABS 16.4* 16.9* 5,645.00 12.9*  HGB 13.4 13.3 12.8* 14.0  HCT 43.0 41.9 41 44.2  MCV 86.2 84.5  --  87.9  PLT 345 352 211 345   Lab Results  Component Value Date   TSH 2.24 03/16/2024   Lab Results  Component Value Date   HGBA1C 6.8 (H) 10/14/2023   Lab Results  Component Value Date   CHOL 108 10/14/2023   HDL 33 (L) 10/14/2023   LDLCALC 55  10/14/2023   TRIG 113 10/14/2023   CHOLHDL 3.3 10/14/2023    Significant Diagnostic Results in last 30 days:  CUP PACEART INCLINIC DEVICE CHECK Result Date: 04/05/2024 Normal in-clinic CRT-D (multi-lead) check. Presenting Rhythm: AF/BiVP. Routine testing was performed. Thresholds, sensing, impedance trend were stable and no changes were required. HF diagnostics are stable. Ongoing AF since 05/30/23. Pt on Pradaxa . No treated arrhythmias. Patient BiV pacing 67% of the time (chronically low). Estimated longevity 6.5 years. Pt enrolled in remote follow-up. MD aware of low BiV pacing percentage and discussed with patient today possible treatment options to help improve that number. Changes made during today's visit: LV pulse width 0.4 mS ---> 0.8 mS LV output 3.0 V @ 0.4 mS ---> 2.5 V @0 .8 Lower Rate Limit 60 ---> 75Drew Brien, BSN, RN   Assessment/Plan: Osteoarthritis   the right  knee pain, reported severe pain last night, topical Voltaren  gel was not effective, declined Tylenol  due to concern of liver function, wants to resume oxycodone  as needed.  The patient stated his pain is much better today.  The patient and his H POA declined x-ray and desire following up with orthopedics.  No redness, swelling, signs symptoms of trauma, or deformity in the R knee.  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   Hyperlipidemia on ASA, Atorvastatin , LDL 55 10/14/23  Hx of CABG Hx of CABF, TAVR, no chest pain, taking Isosorbide   Chronic systolic CHF (congestive heart failure) (HCC) mild edema BLE, RLE>LLE, off Farxiga  due to recurrent UTI, on Entresto , Metoprolol , Metolazone , Spironolactone , Torsemide , BNP 824 01/16/24>>489.5 04/14/24  Hyponatremia Na 133 04/14/24  Chronic kidney  disease, stage 3a (HCC) Bun/creat 33/1.5 04/14/24  Hypothyroidism  on Levothyroxine , TSH 2.24 03/16/24  Essential hypertension blood pressure is controlled, on Entresto , Metoprolol , diuretics.   Gastroesophageal reflux disease stable, on Omeprazole , Hx of Reglan  use,  Hgb 14 01/20/24  Recurrent depression (HCC) better with Effexor , failed Zoloft , feels sleeps too much and depressed.   Type 2 diabetes mellitus (HCC) Hgb A1c 6.8 10/14/23, on insulin     Family/ staff Communication: Plan of care reviewed with the patient, the patient's daughter-HPOA, and  charge nurse  Labs/tests ordered: None

## 2024-04-23 NOTE — Assessment & Plan Note (Signed)
 Na 133 04/14/24

## 2024-04-23 NOTE — Assessment & Plan Note (Signed)
 on Levothyroxine , TSH 2.24 03/16/24

## 2024-04-23 NOTE — Assessment & Plan Note (Signed)
 blood pressure is controlled, on Entresto , Metoprolol , diuretics.

## 2024-04-23 NOTE — Assessment & Plan Note (Signed)
 better with Effexor , failed Zoloft , feels sleeps too much and depressed.

## 2024-04-23 NOTE — Assessment & Plan Note (Signed)
 chronic, wbc 17.7, neutrophils 73% 01/20/24

## 2024-04-23 NOTE — Assessment & Plan Note (Signed)
 mild edema BLE, RLE>LLE, off Farxiga  due to recurrent UTI, on Entresto , Metoprolol , Metolazone , Spironolactone , Torsemide , BNP 824 01/16/24>>489.5 04/14/24

## 2024-04-23 NOTE — Assessment & Plan Note (Signed)
 Bun/creat 33/1.5 04/14/24

## 2024-04-23 NOTE — Assessment & Plan Note (Signed)
 Hx of CABF, TAVR, no chest pain, taking Isosorbide

## 2024-04-23 NOTE — Assessment & Plan Note (Signed)
 stable, on Omeprazole , Hx of Reglan  use, Hgb 14 01/20/24

## 2024-04-23 NOTE — Assessment & Plan Note (Addendum)
   the right  knee pain, reported severe pain last night, topical Voltaren  gel was not effective, declined Tylenol  due to concern of liver function, wants to resume oxycodone  as needed.  The patient stated his pain is much better today.  The patient and his H POA declined x-ray and desire following up with orthopedics.  No redness, swelling, signs symptoms of trauma, or deformity in the R knee.

## 2024-04-23 NOTE — Assessment & Plan Note (Signed)
 no flare up, R+L wrist, takes Allopurinol , colchicine ,  uric acid 7.6 10/10/22

## 2024-04-23 NOTE — Assessment & Plan Note (Signed)
 s/p pace maker, defibrillator , on Amiodarone, Pradaxa, Metoprolol, followed by cardiology.

## 2024-04-23 NOTE — Assessment & Plan Note (Signed)
 Hgb A1c 6.8 10/14/23, on insulin 

## 2024-04-23 NOTE — Assessment & Plan Note (Signed)
 on ASA, Atorvastatin, LDL 55 10/14/23

## 2024-04-27 ENCOUNTER — Telehealth (HOSPITAL_COMMUNITY): Payer: Self-pay

## 2024-04-27 ENCOUNTER — Encounter: Payer: Self-pay | Admitting: Nurse Practitioner

## 2024-04-27 NOTE — Telephone Encounter (Signed)
 Called to confirm/remind patient of their appointment [facility] on 04/28/24.   Appointment:   [] Confirmed  [x] Left mess   [] No answer/No voice mail  [] VM Full/unable to leave message  [] Phone not in service  And to bring in all medications and/or complete list.

## 2024-04-28 ENCOUNTER — Ambulatory Visit (HOSPITAL_COMMUNITY)
Admission: RE | Admit: 2024-04-28 | Discharge: 2024-04-28 | Disposition: A | Discharge status: Home / Self Care | Source: Ambulatory Visit | Attending: Family Medicine | Admitting: Family Medicine

## 2024-04-28 ENCOUNTER — Encounter (HOSPITAL_COMMUNITY): Payer: Self-pay

## 2024-04-28 VITALS — BP 102/60 | HR 97 | Wt 252.2 lb

## 2024-04-28 DIAGNOSIS — Z79890 Hormone replacement therapy (postmenopausal): Secondary | ICD-10-CM | POA: Insufficient documentation

## 2024-04-28 DIAGNOSIS — R0609 Other forms of dyspnea: Secondary | ICD-10-CM | POA: Insufficient documentation

## 2024-04-28 DIAGNOSIS — I350 Nonrheumatic aortic (valve) stenosis: Secondary | ICD-10-CM | POA: Diagnosis not present

## 2024-04-28 DIAGNOSIS — Z7901 Long term (current) use of anticoagulants: Secondary | ICD-10-CM | POA: Insufficient documentation

## 2024-04-28 DIAGNOSIS — Z952 Presence of prosthetic heart valve: Secondary | ICD-10-CM | POA: Insufficient documentation

## 2024-04-28 DIAGNOSIS — I493 Ventricular premature depolarization: Secondary | ICD-10-CM | POA: Diagnosis not present

## 2024-04-28 DIAGNOSIS — I5042 Chronic combined systolic (congestive) and diastolic (congestive) heart failure: Secondary | ICD-10-CM | POA: Diagnosis not present

## 2024-04-28 DIAGNOSIS — Z95810 Presence of automatic (implantable) cardiac defibrillator: Secondary | ICD-10-CM | POA: Diagnosis not present

## 2024-04-28 DIAGNOSIS — I4821 Permanent atrial fibrillation: Secondary | ICD-10-CM | POA: Diagnosis not present

## 2024-04-28 DIAGNOSIS — Z951 Presence of aortocoronary bypass graft: Secondary | ICD-10-CM | POA: Diagnosis not present

## 2024-04-28 DIAGNOSIS — I2510 Atherosclerotic heart disease of native coronary artery without angina pectoris: Secondary | ICD-10-CM | POA: Insufficient documentation

## 2024-04-28 DIAGNOSIS — Z794 Long term (current) use of insulin: Secondary | ICD-10-CM | POA: Diagnosis not present

## 2024-04-28 DIAGNOSIS — I5022 Chronic systolic (congestive) heart failure: Secondary | ICD-10-CM | POA: Diagnosis not present

## 2024-04-28 DIAGNOSIS — E039 Hypothyroidism, unspecified: Secondary | ICD-10-CM | POA: Diagnosis not present

## 2024-04-28 DIAGNOSIS — N189 Chronic kidney disease, unspecified: Secondary | ICD-10-CM | POA: Insufficient documentation

## 2024-04-28 NOTE — Addendum Note (Signed)
 Encounter addended by: Glena [name], FNP on: 04/28/2024 11:35 AM  Actions taken: Level of Service modified, Clinical Note Signed

## 2024-04-28 NOTE — Progress Notes (Addendum)
 AD[facility]ED [facility] FAILURE CLINIC NOTE  Primary Care: Mast, Man X, NP Primary Cardiologist: Dr. Verlin EP: Dr. Fernande HF Cardiologist: Dr. Gardenia  HPI: Justin [name] is a 89 y.o. male with permanent atrial fibrillation, aortic stenosis s/p TAVR, medtronic BiV ICD in 2018 and after TAVR, CKD, CAD s/p CABG in 2007.  Has followed with Dr. Verlin and Dr. Fernande. S/p ICD generator changed 10/24. EF 40-45% at the time. Enrolled in iCM monitoring.  Admitted 3/25 for pneumonia and compressive atelectasis 2/2 COVID and flu infection c/b UTI. S/p thoracentesis 1L. EF 40-45% with mod reduced RV, normal AoV prosthetic. He was discharged to SNF.   Interval hx:  Today he returns for HF follow up with his daughter. Overall feeling fine. He wears his oxygen PRN. He is not SOB walking on flat ground with his rolling walker. Occasional dizziness, no falls or syncope. Swelling improved. Denies palpitations, abnormal bleeding, CP, edema, or PND/Orthopnea. Appetite ok. Weight at home 247 pounds. Taking all medications. Taking all medications provided [facility]. Wears CPAP + oxygen at night. Facility does not provide low-salt dietary options, daughter and patient frustrated.   Current Outpatient Medications  Medication Sig Dispense Refill   albuterol  (VENTOLIN  HFA) 108 (90 Base) MCG/ACT inhaler Inhale 2 puffs into the lungs every 4 (four) hours as needed for wheezing or shortness of breath. 8 g 5   allopurinol  (ZYLOPRIM ) 100 MG tablet Take 1 tablet (100 mg total) by mouth daily. 90 tablet 3   amiodarone  (PACERONE ) 200 MG tablet Take 1 tablet (200 mg total) by mouth daily. 90 tablet 3   aspirin  81 MG tablet Take 1 tablet (81 mg total) by mouth daily. 30 tablet 0   atorvastatin  (LIPITOR) 20 MG tablet TAKE ONE TABLET ONCE DAILY 90 tablet 2   Baclofen 5 MG TABS Take 5 mg by mouth every 12 (twelve) hours as needed.     calcium  carbonate (OS-CAL) 1250 (500 Ca) MG chewable tablet Chew 1 tablet by mouth  daily.     Calcium -Magnesium -Zinc-Vit D3 (CALCIUM -MAGNESIUM -ZINC-D3 PO) Take 2 tablets by mouth daily.     cetirizine (ZYRTEC) 10 MG tablet Take 10 mg by mouth 2 (two) times daily. As needed     colchicine  0.6 MG tablet Take 1 tablet (0.6 mg total) by mouth every 6 (six) hours as needed (gout). 60 tablet 5   dabigatran  (PRADAXA ) 150 MG CAPS capsule TAKE (1) CAPSULE TWICE DAILY. 180 capsule 2   diclofenac  Sodium (VOLTAREN ) 1 % GEL Apply 4 g topically 3 (three) times daily as needed (pain). 50 g 0   docusate sodium  (COLACE) 100 MG capsule Take 100 mg by mouth 2 (two) times daily.     fluticasone  (FLONASE ) 50 MCG/ACT nasal spray Place 2 sprays into both nostrils daily.     insulin  aspart (NOVOLOG ) 100 UNIT/ML injection Inject 4-16 Units into the skin as directed. Inject as per sliding scale: if 141 - 180 = 4 units Inject per sliding scale; 181 - 220 = 6 units Inject per sliding scale; 221 - 260 = 8 units Inject per sliding scale; 261 - 300 = 10 units Inject per sliding scale; 301 - 350 = 12 units Inject per sliding scale; 351 - 400= 14 units Inject per sliding scale; 401+ = 16 units Inject per sliding scale, subcutaneously three times a day     isosorbide  mononitrate (IMDUR ) 30 MG 24 hr tablet Take 1 tablet (30 mg total) by mouth daily. 90 tablet 3   ketotifen  (ZADITOR )  0.025 % ophthalmic solution Place 1 drop into both eyes daily as needed (for itching).     levothyroxine  (SYNTHROID ) 175 MCG tablet Take 175 mcg by mouth daily.     metoCLOPramide  (REGLAN ) 10 MG tablet Take 10 mg by mouth 2 (two) times daily.     metolazone  (ZAROXOLYN ) 2.5 MG tablet Take 1 tablet (2.5 mg total) by mouth once a week every Saturday 12 tablet 3   metoprolol  succinate (TOPROL -XL) 100 MG 24 hr tablet Take 100 mg by mouth daily. Take with or immediately following a meal.     montelukast  (SINGULAIR ) 10 MG tablet TAKE ONE TABLET BY MOUTH ONCE DAILY IN THE EVENING 90 tablet 3   Multiple Vitamins-Minerals (MULTI-VITAMIN GUMMIES)  CHEW Chew 2 Pieces by mouth daily.     Multiple Vitamins-Minerals (PRESERVISION AREDS 2) CAPS Take 1 capsule by mouth in the morning and at bedtime.     oxycodone  (OXY-IR) 5 MG capsule Take 5 mg by mouth every 8 (eight) hours as needed.     OXYGEN Inhale 2 L into the lungs as directed. Resident to wear O2 when ambulating around the unit and during therapy every shift     pantoprazole  (PROTONIX ) 40 MG tablet Take 40 mg by mouth 2 (two) times daily.     polyethylene glycol powder (GLYCOLAX/MIRALAX) 17 GM/SCOOP powder Take 17 g by mouth daily as needed for moderate constipation.     Potassium Chloride  ER 20 MEQ TBCR Take 4 tablets (80 mEq total) by mouth 2 (two) times daily.     Probiotic Product (PROBIOTIC PO) Take 1 capsule by mouth daily.     sacubitril-valsartan (ENTRESTO ) 24-26 MG Take 0.5 tablets by mouth 2 (two) times daily.     spironolactone  (ALDACTONE ) 25 MG tablet Take 1 tablet (25 mg total) by mouth daily. 30 tablet 9   tamsulosin (FLOMAX) 0.4 MG CAPS capsule Take 0.4 mg by mouth daily.     Tiotropium Bromide Monohydrate  (SPIRIVA  RESPIMAT) 2.5 MCG/ACT AERS Inhale 2 puffs into the lungs daily.     torsemide  (DEMADEX ) 20 MG tablet Take 4 tablets (80 mg total) by mouth in the morning AND 2 tablets (40 mg total) every evening. 120 tablet 3   triamcinolone  cream (KENALOG ) 0.1 % Apply 1 application  topically daily as needed for dry skin (affected areas).     valACYclovir  (VALTREX ) 500 MG tablet Take 500 mg by mouth every 12 (twelve) hours as needed (cold sores).     venlafaxine  XR (EFFEXOR  XR) 37.5 MG 24 hr capsule Take 1 capsule (37.5 mg total) by mouth daily with breakfast. 30 capsule 1   Current Facility-Administered Medications  Medication Dose Route Frequency Provider Last Rate Last Admin   azithromycin  (ZITHROMAX ) tablet 500 mg  500 mg Oral Daily        Wt Readings from Last 3 Encounters:  04/28/24 114.4 kg (252 lb 3.2 oz)  04/23/24 110.9 kg (244 lb 6.4 oz)  04/14/24 117.2 kg (258  lb 6.4 oz)   BP 102/60   Pulse 97   Wt 114.4 kg (252 lb 3.2 oz)   SpO2 98%   BMI 40.10 kg/m   PHYSICAL EXAM: General:  NAD. No resp difficulty, walked into clinic with rolling walker, elderly HEENT: Normal Neck: Supple. No JVD. Thick neck Cor: Irregular rate & rhythm. No rubs, gallops or murmurs. Lungs: Diminished throughout Abdomen: Soft, obese, nontender, nondistended.  Extremities: No cyanosis, clubbing, rash, edema Neuro: Alert & oriented x 3, moves all 4 extremities w/o difficulty.  Affect pleasant.  Device interrogation (personally reviewed): OptiVol stable, thoracic impedence down, 100% AF, 1.2 hr/day activity, 74.8% BiV pacing  DATA REVIEW  ECHO: 11/12/22: LVEF 45-50%, RV function low normal.  11/18/23: LVEF 40-45%, RV mod reduced, BAE  CATH: LHC/RHC: 08/2016:  There is moderate aortic valve stenosis. SVG graft was visualized by angiography and is normal in caliber. 1st Mrg lesion, 65 %stenosed. Ost LM to LM lesion, 100 %stenosed. SVG graft was visualized by angiography and is normal in caliber. LIMA graft was visualized by angiography and is normal in caliber. Prox RCA to Mid RCA lesion, 100 %stenosed. SVG graft was visualized by angiography and is normal in caliber and anatomically normal. Hemodynamic findings consistent with mild pulmonary hypertension.   1. 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  2. The Left main is occluded at the ostium.  3. The LAD fills from the patent IMA graft and the Diagonal fills from the patent vein graft 4. The Circumflex fills from the patent vein graft to the OM. There is a moderate stenosis in the proximal segment of the OM branch proximal to the insertion of the vein graft.  5. The distal RCA fills from the patent vein graft.  6. Moderate AS 7. Elevated filling pressures  ASSESSMENT & PLAN:  Chronic Heart failure with reduced EF Etiology of HF: likely  secondary ischemic cardiomyopathy & atrial fibrillation w/ frequent PVCs.  NYHA class / AHA Stage: II-III, functional class confounded by debility.  Volume status & Diuretics: Volume stable on exam and by device interrogation - Continue torsemide  80 mg q am and 40 mg q pm. - Continue KCL 80 bid. - Continue metolazone  2.5 mg/extra 40 KCL on Saturdays Vasodilators: Continue Entresto  12/13 mg bid. Beta-Blocker: Continue Toprol  XL 100 mg daily. MRA: Continue spironolactone  25 mg daily. Cardiometabolic: Off SGLT2i with UTIs. Devices therapies & Valvulopathies: s/p CRT-D, interrogation as above. Advanced therapies: not a candidate Reviewed labs from 04/14/24: SCr 1.5 K 4.4.  2. Permanent Atrial fibrillation & frequent PVCs  - Follow with EP, enrolled in iCM. - Rate controlled.  - Continue amio 200 mg daily. - Continue Pradaxa  150 mg bid.  3. CAD s/p CABG  - CABG in 2007 - LHC in 2018. - No chest pain. - Continue ASA. - Continue atorvastatin  20 mg daily.  4. Aortic stenosis  - s/p TAVR - CRT-D placed due to syncope and TAVR. - Interrogation as above.  5. Hypothyroidism - on synthroid   - Per PCP  6. Dyspnea - Likely multifactorial. Recently improved - Continue home oxygen - PFTs suggest interstitial process with mild diffusion defect; DLCO 60% pred  Follow up in 4 months with Dr. Gardenia. Provided patient and daughter a note for facility requesting low-salt diet for patient.   [name] [place], FNP 04/28/24

## 2024-04-28 NOTE — Patient Instructions (Signed)
 There has been no changes to your medications.  PLEASE FOLLOW A LOW SODIUM DIET. THIS IS IMPERATIVE FOR HIS HEALTH.  Your physician recommends that you schedule a follow-up appointment in: 4 months ( January 2026) ** PLEASE CALL THE OFFICE IN NOVEMBER TO ARRANGE YOUR FOLLOW UP APPOINTMENT.**  If you have any questions or concerns before your next appointment please send us  a message through [place] or call our office at [phone].    TO LEAVE A MESSAGE FOR THE NURSE SELECT OPTION 2, PLEASE LEAVE A MESSAGE INCLUDING: YOUR NAME DATE OF BIRTH CALL BACK NUMBER REASON FOR CALL**this is important as we prioritize the call backs  YOU WILL RECEIVE A CALL BACK THE SAME DAY AS LONG AS YOU CALL BEFORE 4:00 PM  At the [facility], you and your health needs are our priority. As part of our continuing mission to provide you with exceptional heart care, we have created designated Provider Care Teams. These Care Teams include your primary Cardiologist (physician) and Advanced Practice Providers (APPs- Physician Assistants and Nurse Practitioners) who all work together to provide you with the care you need, when you need it.   You may see any of the following providers on your designated Care Team at your next follow up: Dr Toribio Fuel Dr Ezra Shuck Dr. Ria Commander Dr. Morene Brownie Amy Lenetta, NP Caffie Shed, [place] [facility] [place], [place] Beckey Coe, NP [place] Lee, NP Ellouise Class, NP Tinnie Redman, PharmD Jaun Bash, PharmD   Please be sure to bring in all your medications bottles to every appointment.    Thank you for choosing [facility]

## 2024-05-07 ENCOUNTER — Encounter: Payer: Self-pay | Admitting: Family Medicine

## 2024-05-14 NOTE — Progress Notes (Signed)
 ICM remote transmission rescheduled for 05/24/2024

## 2024-05-14 NOTE — Telephone Encounter (Signed)
Expired request

## 2024-05-17 ENCOUNTER — Non-Acute Institutional Stay: Payer: Self-pay | Admitting: Sports Medicine

## 2024-05-17 ENCOUNTER — Encounter

## 2024-05-17 DIAGNOSIS — K219 Gastro-esophageal reflux disease without esophagitis: Secondary | ICD-10-CM

## 2024-05-17 DIAGNOSIS — N400 Enlarged prostate without lower urinary tract symptoms: Secondary | ICD-10-CM

## 2024-05-17 DIAGNOSIS — I509 Heart failure, unspecified: Secondary | ICD-10-CM

## 2024-05-17 DIAGNOSIS — M1A0490 Idiopathic chronic gout, unspecified hand, without tophus (tophi): Secondary | ICD-10-CM

## 2024-05-17 DIAGNOSIS — I480 Paroxysmal atrial fibrillation: Secondary | ICD-10-CM

## 2024-05-17 DIAGNOSIS — E039 Hypothyroidism, unspecified: Secondary | ICD-10-CM | POA: Diagnosis not present

## 2024-05-17 NOTE — Progress Notes (Unsigned)
 Provider:  Dr. Jackalyn Blazing Location:  [facility] Place of Service:    Assisted living   PCP: Mast, Man X, NP Patient Care Team: Mast, Man X, NP as PCP - General (Internal Medicine) [name], MD as PCP - Cardiology (Cardiology) [name], MD as PCP - Electrophysiology (Cardiology) Nieves Cough, MD as Consulting Physician (Urology) Liane Sharyne MATSU, [facility] (Inactive) as Pharmacist (Pharmacist) Leslee Reusing, MD as Consulting Physician (Ophthalmology)  Extended Emergency Contact Information Primary Emergency Contact: [facility] Address: [address]          [place], [place] [address] [place] Phone: [phone] Relation: Daughter Secondary Emergency Contact: Billie Risen Address: [address]          [place], [place] [address] [place] Home Phone: [phone] Mobile Phone: [phone] Relation: Daughter  Goals of Care: Advanced Directive information    02/16/2024   11:59 AM  Advanced Directives  Does Patient Have a Medical Advance Directive? Yes  Type of Advance Directive Living will;Healthcare Power of Attorney  Does patient want to make changes to medical advance directive? No - Patient declined  Copy of Healthcare Power of Attorney in Chart? Yes - validated most recent copy scanned in chart (See row information)      No chief complaint on file.      History of Present Illness  89 yr old M with PMH of OA, CHF, gout, Afib, CAD, CKD, Hypothyroidism ,GERD, Depression is seen today for chronic disease management Pt seen and examined in his room  He seems pleasant and comfortable and does not appear to be in distress. Uses  walker for short distances and wheel chair for long distances Pt denies chest pain, sob, palpitations, abdominal pain, nausea, vomiting, dysuria, hematuria, dizzy or lightheadedness.  Pt followed with cardiology recently.     Latest Ref Rng &  Units 01/20/2024    5:02 PM 12/16/2023   12:00 AM 11/27/2023    4:10 AM  CBC  WBC 4.0 - 10.5 K/uL 17.7  9.7     20.4   Hemoglobin 13.0 - 17.0 g/dL 85.9  87.1     86.6   Hematocrit 39.0 - 52.0 % 44.2  41     41.9   Platelets 150 - 400 K/uL 345  211     352      This result is from an external source.       Latest Ref Rng & Units 04/14/2024    4:37 PM 03/25/2024   12:00 AM 01/20/2024    5:02 PM  BMP  Glucose 70 - 99 mg/dL 99   868   BUN 8 - 27 mg/dL 33  31     32   Creatinine 0.76 - 1.27 mg/dL 8.49  1.5     8.45   BUN/Creat Ratio 10 - 24 22     Sodium 134 - 144 mmol/L 133  132     129   Potassium 3.5 - 5.2 mmol/L 4.4  4.0     3.4   Chloride 96 - 106 mmol/L 93  96     91   CO2 20 - 29 mmol/L 24  27     26    Calcium  8.6 - 10.2 mg/dL 8.3  8.1     8.3      This result is from an external source.      Past Medical History:  Diagnosis Date   Age-related macular degeneration, dry, both eyes  Allergic    24/7; 365 days/year; I'm allergic to pollens, dust, all southern grasses/trees, mold, mildue, cats, dogs (01/07/2017)   Anal fissure    Asthma    sees Dr. Alaine    Benign prostatic hypertrophy    (sees Dr. Colan   CAD (coronary artery disease)    a. s/p CABG 2007. b. Cath 08/2016 - 4/4 patent grafts.   Carotid bruit    carotid u/s 10/10: 0.39% bilaterally   Chronic combined systolic and diastolic CHF (congestive heart failure) (HCC)    Complication of anesthesia 1980s   w/anal cyst OR, he gave me a saddle block then put a narcotic in spinal cord; had a severe reaction to that (01/07/2017)   Congestive heart failure (CHF) [facility])    ED (erectile dysfunction)    Family history of adverse reaction to anesthesia    daughter wakes up during OR (01/07/2017)   GERD (gastroesophageal reflux disease)    Gout    HTN (hypertension)    Hx of colonic polyps    (sees Dr. Abran)   Hyperlipidemia    Hypothyroidism    Moderate to severe aortic stenosis    a. s/p TAVR 02/2017.    Myocardial infarction [facility]) ~ 2000   Obesity    Osteoarthritis    was in my knees, hands (01/07/2017 )   PAF (paroxysmal atrial fibrillation) (HCC)    a. documented post TAVR.   Precancerous skin lesion    (sees Dr. 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   29 mm Edwards Sapien 3 transcatheter heart valve placed via percutaneous right transfemoral approach   Past Surgical History:  Procedure Laterality Date   ANUS SURGERY     opened it back up cause it wouldn't heal; wound up w/a fissure (01/07/2017)   BIV ICD INSERTION CRT-D N/A 05/02/2017   Procedure: BIV ICD INSERTION CRT-D;  Surgeon: [name], MD;  Location: [facility] INVASIVE CV LAB;  Service: Cardiovascular;  Laterality: N/A;   CARDIAC CATHETERIZATION  10/29/2005   CARDIAC CATHETERIZATION N/A 08/27/2016   Procedure: Right/Left Heart Cath and Coronary/Graft Angiography;  Surgeon: Lonni JONETTA Cash, MD;  Location: [facility] INVASIVE CV LAB;  Service: Cardiovascular;  Laterality: N/A;   CARDIOVERSION N/A 04/24/2021   Procedure: CARDIOVERSION;  Surgeon: Okey Vina GAILS, MD;  Location: [facility] ENDOSCOPY;  Service: Cardiovascular;  Laterality: N/A;   CARDIOVERSION N/A 11/08/2021   Procedure: CARDIOVERSION;  Surgeon: Mona Vinie BROCKS, MD;  Location: [facility] ENDOSCOPY;  Service: Cardiovascular;  Laterality: N/A;   CARDIOVERSION N/A 02/06/2022   Procedure: CARDIOVERSION;  Surgeon: Delford Maude BROCKS, MD;  Location: [facility] ENDOSCOPY;  Service: Cardiovascular;  Laterality: N/A;   CATARACT EXTRACTION W/ INTRAOCULAR LENS  IMPLANT, BILATERAL Bilateral    CATARACT EXTRACTION, BILATERAL  2012   COLONOSCOPY  06/30/2008   no repeats needed    COLONOSCOPY     had 3 or 4 in the past    CORONARY ARTERY BYPASS GRAFT  2007   CABG X4   CYST EXCISION PERINEAL  1980s   HAMMER TOE SURGERY Bilateral    ICD GENERATOR CHANGEOUT N/A 05/30/2023   Procedure: ICD GENERATOR CHANGEOUT;  Surgeon:  [name], MD;  Location: [facility] INVASIVE CV LAB;  Service: Cardiovascular;  Laterality: N/A;   JOINT REPLACEMENT     KNEE ARTHROPLASTY  07/30/2011   Procedure: COMPUTER ASSISTED TOTAL KNEE ARTHROPLASTY;  Surgeon: Cordella Glendia Hutchinson;  Location: [facility] OR;  Service: Orthopedics;  Laterality: Left;  left total knee arthroplasty   MASTECTOMY SUBCUTANEOUS Bilateral    MULTIPLE TOOTH EXTRACTIONS     ORIF FINGER / THUMB FRACTURE Right ~ 1980   repair of thumb injury   PROSTATE BIOPSY     REPLACEMENT TOTAL KNEE BILATERAL Bilateral 2012   TEE WITHOUT CARDIOVERSION N/A 03/04/2017   Procedure: TRANSESOPHAGEAL ECHOCARDIOGRAM (TEE);  Surgeon: [name], MD;  Location: [facility] OR;  Service: Open Heart Surgery;  Laterality: N/A;   TOTAL KNEE REVISION Right 05/18/2019   Procedure: RIGHT PATELLA REVISION/REMOVAL;  Surgeon: Addie Cordella Hamilton, MD;  Location[facility] OR;  Service: Orthopedics;  Laterality: Right;   TRANSCATHETER AORTIC VALVE REPLACEMENT, TRANSFEMORAL N/A 03/04/2017   Procedure: TRANSCATHETER AORTIC VALVE REPLACEMENT, TRANSFEMORAL;  Surgeon: [name], MD;  Location: [facility] OR;  Service: Open Heart Surgery;  Laterality: N/A;    reports that he quit smoking about 62 years ago. His smoking use included cigarettes. He started smoking about 75 years ago. He has a 45.5 pack-year smoking history. He has never used smokeless tobacco. He reports that he does not currently use alcohol after a past usage of about 7.0 standard drinks of alcohol per week. He reports that he does not use drugs. Social History   Socioeconomic History   Marital status: Married    Spouse name: Not on file   Number of children: 3   Years of education: Not on file   Highest education level: Not on file  Occupational History   Occupation: Product/process development scientist    Comment: builds malls  Tobacco Use   Smoking status: Former    Current packs/day: 0.00    Average packs/day: 3.5 packs/day for 13.0 years (45.5 ttl  pk-yrs)    Types: Cigarettes    Start date: 81    Quit date: 1963    Years since quitting: 62.7   Smokeless tobacco: Never   Tobacco comments:    Former smoker 04/19/21  Vaping Use   Vaping status: Never Used  Substance and Sexual Activity   Alcohol use: Not Currently    Alcohol/week: 7.0 standard drinks of alcohol    Types: 7 Cans of beer per week    Comment: 1 beer daily after 5pm (04/19/21)   Drug use: No   Sexual activity: Never  Other Topics Concern   Not on file  Social History Narrative   FH of CAD, Male 1st degree relative less than age 35.   Social Drivers of Corporate investment banker Strain: Low Risk  (03/31/2023)   Overall Financial Resource Strain (CARDIA)    Difficulty of Paying Living Expenses: Not very hard  Food Insecurity: No Food Insecurity (11/18/2023)   Hunger Vital Sign    Worried About Running Out of Food in the Last Year: Never true    Ran Out of Food in the Last Year: Never true  Transportation Needs: No Transportation Needs (11/18/2023)   PRAPARE - Administrator, Civil Service (Medical): No    Lack of Transportation (Non-Medical): No  Physical Activity: Sufficiently Active (03/31/2023)   Exercise Vital Sign    Days of Exercise per Week: 7 days    Minutes of Exercise per Session: 30 min  Stress: No Stress Concern Present (03/31/2023)   [employer] of Occupational Health - Occupational Stress Questionnaire    Feeling of Stress : Not at all  Social Connections: Socially Integrated (11/18/2023)   Social Connection and Isolation Panel  Frequency of Communication with Friends and Family: Three times a week    Frequency of Social Gatherings with Friends and Family: Three times a week    Attends Religious Services: More than 4 times per year    Active Member of Clubs or Organizations: Yes    Attends Banker Meetings: More than 4 times per year    Marital Status: Married  Catering manager Violence: Not At Risk (11/17/2023)    Humiliation, Afraid, Rape, and Kick questionnaire    Fear of Current or Ex-Partner: No    Emotionally Abused: No    Physically Abused: No    Sexually Abused: No    Functional Status Survey:    Family History  Problem Relation Age of Onset   Heart attack Father 19   Allergic rhinitis Father    Asthma Father    Heart failure Mother 44   Uterine cancer Mother    Breast cancer Mother    Colon cancer Neg Hx    Esophageal cancer Neg Hx     Health Maintenance  Topic Date Due   Influenza Vaccine  03/26/2024   HEMOGLOBIN A1C  04/12/2024   COVID-19 Vaccine (7 - 2025-26 season) 04/26/2024   OPHTHALMOLOGY EXAM  11/06/2024   FOOT EXAM  04/05/2025   Medicare Annual Wellness (AWV)  04/05/2025   DTaP/Tdap/Td (3 - Td or Tdap) 10/28/2030   Pneumococcal Vaccine: 50+ Years  Completed   Zoster Vaccines- Shingrix  Completed   HPV VACCINES  Aged Out   Meningococcal B Vaccine  Aged Out   Hepatitis B Vaccines 19-59 Average Risk  Discontinued    Allergies  Allergen Reactions   Peanut-Containing Drug Products Anaphylaxis   Sulfonamide Derivatives Anaphylaxis   Amlodipine  Swelling and Other (See Comments)    Swelling in ankles   Eliquis  [Apixaban ] Other (See Comments)    Back/hip pain   Lisinopril  Cough   Xarelto  [Rivaroxaban ] Other (See Comments)    Back/hip pain    Outpatient Encounter Medications as of 05/17/2024  Medication Sig   albuterol  (VENTOLIN  HFA) 108 (90 Base) MCG/ACT inhaler Inhale 2 puffs into the lungs every 4 (four) hours as needed for wheezing or shortness of breath.   allopurinol  (ZYLOPRIM ) 100 MG tablet Take 1 tablet (100 mg total) by mouth daily.   amiodarone  (PACERONE ) 200 MG tablet Take 1 tablet (200 mg total) by mouth daily.   aspirin  81 MG tablet Take 1 tablet (81 mg total) by mouth daily.   atorvastatin  (LIPITOR) 20 MG tablet TAKE ONE TABLET ONCE DAILY   Baclofen 5 MG TABS Take 5 mg by mouth every 12 (twelve) hours as needed.   calcium  carbonate (OS-CAL) 1250  (500 Ca) MG chewable tablet Chew 1 tablet by mouth daily.   Calcium -Magnesium -Zinc-Vit D3 (CALCIUM -MAGNESIUM -ZINC-D3 PO) Take 2 tablets by mouth daily.   cetirizine (ZYRTEC) 10 MG tablet Take 10 mg by mouth 2 (two) times daily. As needed   colchicine  0.6 MG tablet Take 1 tablet (0.6 mg total) by mouth every 6 (six) hours as needed (gout).   dabigatran  (PRADAXA ) 150 MG CAPS capsule TAKE (1) CAPSULE TWICE DAILY.   diclofenac  Sodium (VOLTAREN ) 1 % GEL Apply 4 g topically 3 (three) times daily as needed (pain).   docusate sodium  (COLACE) 100 MG capsule Take 100 mg by mouth 2 (two) times daily.   fluticasone  (FLONASE ) 50 MCG/ACT nasal spray Place 2 sprays into both nostrils daily.   insulin  aspart (NOVOLOG ) 100 UNIT/ML injection Inject 4-16 Units into the skin  as directed. Inject as per sliding scale: if 141 - 180 = 4 units Inject per sliding scale; 181 - 220 = 6 units Inject per sliding scale; 221 - 260 = 8 units Inject per sliding scale; 261 - 300 = 10 units Inject per sliding scale; 301 - 350 = 12 units Inject per sliding scale; 351 - 400= 14 units Inject per sliding scale; 401+ = 16 units Inject per sliding scale, subcutaneously three times a day   isosorbide  mononitrate (IMDUR ) 30 MG 24 hr tablet Take 1 tablet (30 mg total) by mouth daily.   ketotifen  (ZADITOR ) 0.025 % ophthalmic solution Place 1 drop into both eyes daily as needed (for itching).   levothyroxine  (SYNTHROID ) 175 MCG tablet Take 175 mcg by mouth daily.   metoCLOPramide  (REGLAN ) 10 MG tablet Take 10 mg by mouth 2 (two) times daily.   metolazone  (ZAROXOLYN ) 2.5 MG tablet Take 1 tablet (2.5 mg total) by mouth once a week every Saturday   metoprolol  succinate (TOPROL -XL) 100 MG 24 hr tablet Take 100 mg by mouth daily. Take with or immediately following a meal.   montelukast  (SINGULAIR ) 10 MG tablet TAKE ONE TABLET BY MOUTH ONCE DAILY IN THE EVENING   Multiple Vitamins-Minerals (MULTI-VITAMIN GUMMIES) CHEW Chew 2 Pieces by mouth daily.    Multiple Vitamins-Minerals (PRESERVISION AREDS 2) CAPS Take 1 capsule by mouth in the morning and at bedtime.   oxycodone  (OXY-IR) 5 MG capsule Take 5 mg by mouth every 8 (eight) hours as needed.   OXYGEN Inhale 2 L into the lungs as directed. Resident to wear O2 when ambulating around the unit and during therapy every shift   pantoprazole  (PROTONIX ) 40 MG tablet Take 40 mg by mouth 2 (two) times daily.   polyethylene glycol powder (GLYCOLAX/MIRALAX) 17 GM/SCOOP powder Take 17 g by mouth daily as needed for moderate constipation.   Potassium Chloride  ER 20 MEQ TBCR Take 4 tablets (80 mEq total) by mouth 2 (two) times daily.   Probiotic Product (PROBIOTIC PO) Take 1 capsule by mouth daily.   sacubitril-valsartan (ENTRESTO ) 24-26 MG Take 0.5 tablets by mouth 2 (two) times daily.   spironolactone  (ALDACTONE ) 25 MG tablet Take 1 tablet (25 mg total) by mouth daily.   tamsulosin (FLOMAX) 0.4 MG CAPS capsule Take 0.4 mg by mouth daily.   Tiotropium Bromide Monohydrate  (SPIRIVA  RESPIMAT) 2.5 MCG/ACT AERS Inhale 2 puffs into the lungs daily.   torsemide  (DEMADEX ) 20 MG tablet Take 4 tablets (80 mg total) by mouth in the morning AND 2 tablets (40 mg total) every evening.   triamcinolone  cream (KENALOG ) 0.1 % Apply 1 application  topically daily as needed for dry skin (affected areas).   valACYclovir  (VALTREX ) 500 MG tablet Take 500 mg by mouth every 12 (twelve) hours as needed (cold sores).   venlafaxine  XR (EFFEXOR  XR) 37.5 MG 24 hr capsule Take 1 capsule (37.5 mg total) by mouth daily with breakfast.   Facility-Administered Encounter Medications as of 05/17/2024  Medication   azithromycin  (ZITHROMAX ) tablet 500 mg    Review of Systems Negative unless indicated in HPI.  There were no vitals filed for this visit. There is no height or weight on file to calculate BMI. BP Readings from Last 3 Encounters:  04/28/24 102/60  04/23/24 120/65  04/14/24 97/66   Wt Readings from Last 3 Encounters:   04/28/24 252 lb 3.2 oz (114.4 kg)  04/23/24 244 lb 6.4 oz (110.9 kg)  04/14/24 258 lb 6.4 oz (117.2 kg)   Physical  Exam Constitutional:      Appearance: Normal appearance.  HENT:     Head: Normocephalic and atraumatic.  Cardiovascular:     Rate and Rhythm: Normal rate and regular rhythm.     Pulses: Normal pulses.     Heart sounds: Normal heart sounds.  Pulmonary:     Effort: No respiratory distress.     Breath sounds: No stridor. No wheezing or rales.  Abdominal:     General: Bowel sounds are normal. There is no distension.     Palpations: Abdomen is soft.     Tenderness: There is no abdominal tenderness. There is no guarding.  Musculoskeletal:        General: Swelling present.     Comments: 1+ pitting odema  Neurological:     Mental Status: He is alert. Mental status is at baseline.     Motor: No weakness.     Labs reviewed: Basic Metabolic Panel: Recent Labs    06/30/23 1616 07/01/23 0355 07/02/23 0415 11/25/23 0728 11/26/23 0417 11/27/23 0410 12/16/23 0000 12/23/23 1208 01/20/24 1702 03/25/24 0000 04/14/24 1637  NA  --  140   < > 134* 134* 134*   < > 132* 129* 132* 133*  K  --  4.1   < > 4.0 3.8 3.8   < > 3.7 3.4* 4.0 4.4  CL  --  106   < > 98 97* 96*   < > 93* 91* 96* 93*  CO2  --  24   < > 27 27 29    < > 26 26 27* 24  GLUCOSE  --  125*   < > 87 94 106*  --  92 131*  --  99  BUN  --  14   < > 41* 43* 46*   < > 20 32* 31* 33*  CREATININE  --  1.30*   < > 1.13 1.11 1.32*   < > 1.58* 1.54* 1.5* 1.50*  CALCIUM   --  7.8*   < > 7.8* 7.8* 7.7*   < > 8.1* 8.3* 8.1* 8.3*  MG 2.7*  --    < > 2.7* 2.6* 2.5*  --   --   --   --   --   PHOS 2.3* 3.3  --   --   --   --   --   --   --   --   --    < > = values in this interval not displayed.   Liver Function Tests: Recent Labs    11/18/23 1918 11/19/23 1844 11/20/23 1317 12/16/23 0000 01/20/24 1702 03/16/24 0000  AST 76*  --  93* 33 66* 26  ALT 45*  --  70* 33 31 15  ALKPHOS 147*  --  151* 160* 130* 155*   BILITOT 0.8  --  0.8  --  0.7  --   PROT 6.7 7.3 6.5  --  6.9  --   ALBUMIN  2.1*  --  2.1* 2.4* 2.6* 3.0*   No results for input(s): LIPASE, AMYLASE in the last 8760 hours. No results for input(s): AMMONIA in the last 8760 hours. CBC: Recent Labs    11/26/23 0417 11/27/23 0410 12/16/23 0000 01/20/24 1702  WBC 19.1* 20.4* 9.7 17.7*  NEUTROABS 16.4* 16.9* 5,645.00 12.9*  HGB 13.4 13.3 12.8* 14.0  HCT 43.0 41.9 41 44.2  MCV 86.2 84.5  --  87.9  PLT 345 352 211 345   Cardiac Enzymes: Recent Labs  11/22/23 0231  CKTOTAL 29*  CKMB 4.0   BNP: Invalid input(s): POCBNP Lab Results  Component Value Date   HGBA1C 6.8 (H) 10/14/2023   Lab Results  Component Value Date   TSH 2.24 03/16/2024   Lab Results  Component Value Date   VITAMINB12 503 10/14/2023   No results found for: FOLATE Lab Results  Component Value Date   IRON 151 06/30/2023   TIBC 400 06/30/2023   FERRITIN 28 06/30/2023    Imaging and Procedures obtained prior to SNF admission: No results found.  Assessment and Plan Assessment & Plan  CHF Euvolemic on exam  Avoid salty foods Cont with aspirin , entresto , metoprolol ,spironolactone , torsemide , Imdur    Afib  No signs of bleeding  Cont with amiodarone , pradaxa   Hypothyroidism  Cont with levothyroxine    GERD  Cont with pantoprazole    BPH  Cont with flomax  Seasonal allergies Cont with allegra, flonase   Gout  Cont with allopurinol 

## 2024-05-21 ENCOUNTER — Encounter: Payer: Self-pay | Admitting: Sports Medicine

## 2024-05-24 ENCOUNTER — Telehealth (HOSPITAL_COMMUNITY): Payer: Self-pay | Admitting: Cardiology

## 2024-05-24 ENCOUNTER — Telehealth: Payer: Self-pay

## 2024-05-24 ENCOUNTER — Ambulatory Visit: Attending: Cardiology

## 2024-05-24 DIAGNOSIS — Z95810 Presence of automatic (implantable) cardiac defibrillator: Secondary | ICD-10-CM

## 2024-05-24 DIAGNOSIS — I5042 Chronic combined systolic (congestive) and diastolic (congestive) heart failure: Secondary | ICD-10-CM | POA: Diagnosis not present

## 2024-05-24 NOTE — Progress Notes (Unsigned)
 EPIC Encounter for ICM Monitoring  Patient Name: Justin [name] is a 89 y.o. male Date: 05/24/2024 Primary Care Physican: Mast, Man X, NP Primary Cardiologist: McAlhany/Sabharwal  Electrophysiologist:  [name] Pore Pacing: 77.1%  09/27/2022 Weight: 270 lbs 02/12/2023 Weight: 261 lbs 07/07/2023 Office Weight: 261 lbs 10/07/2023 Weight: 245-250 lbs 10/24/2023 Office Weight: 252 lbs 03/01/2024 Office Weight: 252 lbs 03/17/2024 Office Weight: 253 lbs   Time in AT/AF  24.0 hr/day (100.0%)   Spoke with daughter, [name] and heart failure questions reviewed.  Transmission results reviewed.  She reports pt said he has gained 3-4 lbs over the last few days but unsure of exact weight.  She has spoken at length to the facility dietician, chef and supervisor at his assisted living facility to ensure he is getting a low salt diet.    Since 04/12/2024 ICM Remote Transmission, [employer] thoracic impedance suggesting possible fluid accumulation starting 04/30/2024 but trending slightly back toward baseline.   Prescribed: Torsemide  20 mg take 4 tablets (80 mg total) by mouth every morning and 2 tablets (40 mg total) every afternoon    Potassium 20 mEq take 4 tablet(s) (80 mEq total) by mouth twice a day.     Metolazone  2.5 mg Take 1 tablet by mouth once a week every Saturday (per 7/15 phone note should take extra 40 mEq of Potassium with Metolazone ).   Spironolactone  25 mg take 1 tablet daily   Labs: 04/14/2024 Creatinine 1.50, BUN 33, Potassium 4.4, Sodium 133, GFR 45, BNP 489.4 03/25/2024 Creatinine 1.45, BUN 31, Potassium 4.0, Sodium 132, GFR 47 03/09/2024 Creatinine 1.43, BUN 32, Potassium 3.5, Sodium 131, GFR 47 02/19/2024 Creatinine 1.21, BUN 29, Potassium 4.3, Sodium 132, GFR 58 01/29/2024 Creatinine 1.25, BUN 20, Potassium 4.8, Sodium 135, GFR 56, BNP 454 01/20/2024 Creatinine 1.54, BUN 32, Potassium 3.4, Sodium 129, GFR 43  01/16/2024 BNP 823.5 12/23/2023 Creatinine 1.58, BUN 20, Potassium 3.7,  Sodium 132, GFR 42, BNP 579.7 12/16/2023 Creatinine 1.3,   BUN 25, Potassium 3.9, Sodium 134  11/27/2023 Creatinine 1.32, BUN 46, Potassium 3.8, Sodium 134, GFR 52 A complete set of results can be found in Results Review.   Recommendations:  Copy sent to [name], NP for review and recommendations if needed.    Follow-up plan: ICM clinic phone appointment on 05/27/2024 to recheck fluid levels.   91 day device clinic remote transmission 06/01/2024.     EP/Cardiology Office Visits:   07/06/2024 with [name], PA.  08/03/2024 with [name].   Recall 09/14/2024 with [name].   Copy of ICM check sent to Dr [name].    Remote monitoring is medically necessary for Heart Failure Management.    90 day Daily Thoracic Impedance ICM trend: 02/23/2024 through 05/24/2024.    12-14 Month Thoracic Impedance ICM trend:     [name], RN 05/24/2024 3:03 PM

## 2024-05-24 NOTE — Telephone Encounter (Signed)
-----   Message from Harlene CHRISTELLA Gainer sent at 05/24/2024  4:19 PM EDT ----- Sure, will add Chantal to see if she can send order. Thanks! ----- Message ----- From: Armanda Mitzie RAMAN, RN Sent: 05/24/2024   4:03 PM EDT To: Harlene CHRISTELLA Gainer, FNP  Harlene,  I am unable to fax and I believe the facility requires a faxed order. Can Rendy Lazard or someone there fax the order? ----- Message ----- From: Gainer Harlene CHRISTELLA, FNP Sent: 05/24/2024   3:54 PM EDT To: Mitzie RAMAN Armanda, RN  Take extra dose of metolazone  2.5 mg + extra 40 KCL x 1 today ----- Message ----- From: Armanda Mitzie RAMAN, RN Sent: 05/24/2024   3:31 PM EDT To: Harlene CHRISTELLA Gainer, FNP  Sent for review and recommendations if needed.  Thank you!

## 2024-05-24 NOTE — Telephone Encounter (Signed)
 Remote ICM transmission received.  Attempted call to daughter, Yonah Tangeman, regarding ICM remote transmission.  Left message to return call.

## 2024-05-24 NOTE — Telephone Encounter (Signed)
 [facility]  Order faxed to [phone]

## 2024-05-24 NOTE — Progress Notes (Unsigned)
 Daughter called back and left message to report patients weight increased from 257 lbs to 259 lbs today.

## 2024-05-24 NOTE — Progress Notes (Unsigned)
  Received: Today [name], [name], FNP  Rhyan Radler, Mitzie RAMAN, RN Take extra dose of metolazone  2.5 mg + extra 40 KCL x 1 today         Message sent back to Harlene asking if HF can fax the order to the facility since unable to fax.     Received: Today [place], [name], FNP  Meggin Ola, Mitzie RAMAN, RN; Jerona Dalton HERO, CMA Sure, will add Chantal to see if she can send order. Thanks!

## 2024-05-25 DIAGNOSIS — I10 Essential (primary) hypertension: Secondary | ICD-10-CM | POA: Diagnosis not present

## 2024-05-25 NOTE — Progress Notes (Signed)
 Per phone note, Waynette Lyon faxed order to [facility] for patient to take metolazone  2.5 mg + extra 40 KCL.    Spoke with daughter, Jet Armbrust per DPR and advised [name] at [facility] clinic recommended patient take Metolazone  today with 40 mEq of potassium.  She verbalized understanding.  Will recheck fluid levels on 10/2 and if normal she does not need a call back.  She appreciated the info and quick response.

## 2024-05-26 ENCOUNTER — Telehealth: Payer: Self-pay

## 2024-05-26 ENCOUNTER — Encounter: Payer: Self-pay | Admitting: Primary Care

## 2024-05-26 ENCOUNTER — Ambulatory Visit (INDEPENDENT_AMBULATORY_CARE_PROVIDER_SITE_OTHER): Admitting: Primary Care

## 2024-05-26 VITALS — BP 126/64 | HR 90 | Temp 97.7°F | Ht 66.0 in | Wt 264.2 lb

## 2024-05-26 DIAGNOSIS — J9611 Chronic respiratory failure with hypoxia: Secondary | ICD-10-CM | POA: Diagnosis not present

## 2024-05-26 DIAGNOSIS — J4520 Mild intermittent asthma, uncomplicated: Secondary | ICD-10-CM

## 2024-05-26 DIAGNOSIS — J984 Other disorders of lung: Secondary | ICD-10-CM

## 2024-05-26 DIAGNOSIS — G4733 Obstructive sleep apnea (adult) (pediatric): Secondary | ICD-10-CM

## 2024-05-26 DIAGNOSIS — J90 Pleural effusion, not elsewhere classified: Secondary | ICD-10-CM

## 2024-05-26 NOTE — Telephone Encounter (Signed)
 Returned call to daughter as requested by voice mail message. Daughter stated the fax for Metolazone  and KCL were not received according to [facility].   She stated she thinks patient is in Room 918.  She was provided a fax number of [phone] and may call Porter that is a Interior and spatial designer at [phone].

## 2024-05-26 NOTE — Patient Instructions (Signed)
  VISIT SUMMARY: Today, we reviewed your fluid management and respiratory status. You have been experiencing fluid retention, and your weight has fluctuated recently. We discussed your current medications, including adjustments to your diuretic regimen. Your breathing is stable, and you are using your CPAP machine consistently. We also reviewed your COPD management and your low-salt diet.  YOUR PLAN: -CONGESTIVE HEART FAILURE WITH FLUID OVERLOAD: Congestive heart failure means your heart is not pumping blood as well as it should, leading to fluid buildup in your body. Cardiology adjusted your diuretic medication to help reduce this fluid. Please continue your current diuretic regimen, monitor your weight daily, check your lab work tomorrow, and maintain your low-salt diet.  -CHRONIC RESPIRATORY FAILURE WITH HYPOXIA: Chronic respiratory failure with hypoxia means your lungs are not getting enough oxygen into your blood. You are currently using supplemental oxygen at night or with exertion, which is helping to keep your oxygen levels stable. Please continue using your supplemental oxygen at two liters as needed.  -OBSTRUCTIVE SLEEP APNEA: Obstructive sleep apnea is a condition where your breathing stops and starts during sleep. You are using your CPAP machine consistently, which is helping to keep your condition well controlled. Please continue using your CPAP therapy with the current settings.  -CHRONIC OBSTRUCTIVE PULMONARY DISEASE (COPD): COPD is a chronic lung disease that makes it hard to breathe. You are managing it well with Spiriva  Respimat, and there are no signs of worsening symptoms. Please continue taking Spiriva  Respimat once daily.  INSTRUCTIONS: Please monitor your weight daily and check your lab work tomorrow. Continue with your current medications and therapies as discussed. Maintain your low-salt diet and follow up with cardiology as needed.  Follow-up 3 months with Dr. Jess or Landry  NP

## 2024-05-26 NOTE — Progress Notes (Signed)
 @Patient  ID: Justin [name], male    DOB: 1934-04-05, 89 y.o.   MRN: [id number]  Chief Complaint  Patient presents with   Asthma    Referring provider: Mast, Man X, NP  HPI: 89 year old, former smoker. PMH significant for CHF, CAD, MI, aortic stenosis s/p TAVR, afib, hypothyroidism, type 2 diabetes, irritable larynx syndrome, asthma, viral pneumonia, CKD, CABG x4, hyperlipidemia.  Previous [facility] pulmonary encounter:  02/17/2024 Discussed the use of AI scribe software for clinical note transcription with the patient, who gave verbal consent to proceed.  History of Present Illness   Justin [name] is an 89 year old male with congestive heart failure and chronic obstructive pulmonary disease who presents for a follow-up breathing test. He is accompanied by his daughter.   He is here for a follow-up breathing test after being hospitalized for 11 days in March due to respiratory failure, multifocal pneumonia and heart failure. A sleep study revealed severe obstructive sleep apnea. Auto CPAP therapy was recommended but delayed due to follow-up.   He has a significant cardiac history including congestive heart failure, coronary artery disease, aortic stenosis, and atrial fibrillation. He underwent CABG in 2007 and a transaortic valve replacement. He is currently on amiodarone , and his diuretic dosage was recently increased to manage fluid overload, noted in his lungs, legs, and jugular vein.  He has a history of smoking, having quit 62 years ago after smoking three and a half packs a day. He experiences chronic shortness of breath and a cough, described as 'just clearing my throat.' Lung function measured at 80% post-bronchodilator with curvature on flow volume loop indicating mild obstructive lung disease.  He uses oxygen therapy, with four liters during the day when using portable concentrator and two liters when using continuous flow oxygen. He is 'always short of breath'. His weight  has fluctuated due to fluid retention, and he monitors it closely.  No recent flare-ups or other concerning symptoms.     Severe obstructive sleep apnea (OSA) (ICD-10: G47.33) The sleep study conducted last month confirmed severe OSA. Due to existing oxygen requirements, blending two liters of oxygen into the CPAP is advised. CPAP usage for a minimum of four hours per night is necessary to meet insurance requirements of 70% usage.  - Start auto CPAP 4 to 20 cm H2O with a nasal mask - Coordinate CPAP and oxygen adapter delivery - Blend 2 liters of oxygen into CPAP for nighttime use - Ensure CPAP usage for a minimum of 4-6 hours per night, 70% of the time - Follow up with Doctor Jess regarding CPAP and oxygen requirements   Mild obstructive lung disease (COPD) (ICD-10: J44.9) Pulmonary function tests indicate mild obstructive lung disease with lung function at 80% post-bronchodilator, no significant BD response, curvature on flow volume loop. His history of smoking contributed to COPD development. Recommend trial scheduled bronchodilator, Spiriva  Respimat 2.5mcg two puffs once daily, to help alleviate dyspnea symptoms - Initiate Spiriva  Respimat 2.5mcg daily - Monitor for improvement in dyspnea and activity tolerance - Report changes or lack of improvement via MyChart   Chronic respiratory failure - Continue 2L supplement oxygen to maintain O2 >88-90%    Follow-up 6-8 weeks for CPAP compliance and assess response to Spiriva      04/14/2024 Discussed the use of AI scribe software for clinical note transcription with the patient, who gave verbal consent to proceed.  History of Present Illness Justin [name] is an 89 year old male with  mild obstructive lung disease and severe sleep apnea who presents for follow-up regarding his respiratory status and CPAP compliance.  He had a breathing test that showed 80% lung function. He was started on Spiriva  Respimat due to his smoking history,  having quit smoking 62 years ago after smoking three and a half packs a day. He uses the inhaler once a day but experiences choking. No significant change in breathing or energy levels has been noted. He manages short walks but experiences heavy breathing, which he attributes to deep breathing exercises learned in therapy.  He underwent a sleep study and uses an auto CPAP machine with oxygen connected at night. He uses the CPAP from around 9:30-10:00 PM until 4-5 AM, achieving more than four hours of usage per night. There was a break in therapy for about two weeks in July due to pain from a tooth extraction, which prevented CPAP use. He wakes up frequently to urinate, attributed to diuretic use, but returns to sleep quickly. He feels he is getting restful sleep despite these interruptions.  He has experienced a weight gain of approximately 13 pounds since June. His current medications include torsemide  80 mg in the morning and 40 mg in the evening, potassium 80 mg twice a day, metolazone  2.5 mg on Saturdays with extra potassium, spironolactone  25 mg daily, Entresto  twice a day, and metoprolol . His daughter notes that he is always 'a few days away from a hospital visit' due to fluid issues.  Airview download 03/15/24-04/13/24 Usage days 20/30 days (67%) Average usage days used 6 hours 28 mins Pressure 5-20cm h20 (10.2cm h20-95%) Airleaks 17.7L/min (95%) AHI 1.0     1. Chronic systolic heart failure (HCC) (Primary) - B Nat Peptide - Basic Metabolic Panel (BMET)   2. OSA (obstructive sleep apnea)   Assessment & Plan Heart failure with fluid overload Recent weight gain of approximately 10-13 pounds indicates fluid retention along with trace-+1 BLE pitting edema. Recent adjustment of diuretics by cardiologist, but fluid retention persists. No significant dietary changes reported. - Continue Torsemide  80mg  every morning and 40mg  every evening; spirolactone 25mg  daily and zaroxolyn  once a week on  Saturday  - Order lab work to assess kidney function and BNP - Consider adjustment of diuretics based on lab results   Severe obstructive sleep apnea on CPAP and nocturnal oxygen Severe obstructive sleep apnea well-controlled with CPAP and nocturnal oxygen. Compliance with CPAP usage is 67%, needs to be over 70%. Recent break in therapy due to tooth extraction, but compliance has resumed. CPAP effectively reduces apneic events to less than one per hour. - Continue CPAP and nocturnal oxygen - Schedule virtual follow-up in 4-6 weeks to reassess compliance - Document tooth extraction as reason for break in therapy   Mild chronic obstructive pulmonary disease (COPD) Mild obstructive lung disease based on curvature to flow volume loop on spirometry, FEV1 80%. Former heavy smoker, quit 62 years ago. Currently using Spiriva  Respimat 2.5mcg once daily. No significant change in breathing or energy levels reported. - Continue Spiriva  Respimat 2.5mcg once daily for now. We can consider discontinue at follow-up if no improvement with daily BD. Dyspne largely related to heart failure and obesity.    Chronic hypoxemic respiratory failure on supplemental oxygen Chronic hypoxemic respiratory failure managed with supplemental oxygen. Oxygen used consistently with CPAP at night. Oxygen saturation levels are stable with deep breathing exercises. O2 97% RA today. Advised to use oxygen during moderate exertion to maintain O2 level >88-90% - Continue supplemental oxygen use,  especially during exertion    05/26/2024- Interim hx  Discussed the use of AI scribe software for clinical note transcription with the patient, who gave verbal consent to proceed.  History of Present Illness Zyden Suman is an 89 year old male with congestive heart failure and COPD who presents for follow-up regarding fluid management and respiratory status.  He has a history of congestive heart failure and is currently experiencing  fluid retention. His weight has fluctuated recently, with readings of 259 lbs two days ago, 257 lbs yesterday, and 259 lbs today. He is on torsemide , previously taking 80 mg in the morning and 40 mg in the evening, with an additional dose on Saturdays. Recently, an extra dose of torsemide  was added to his regimen due to weight gain, and he is also taking 40 MEQs of potassium. He experiences increased urination, especially after the additional dose, with frequency every 30 minutes during the day and hourly at night.  He has a history of sleep apnea and uses a CPAP machine with supplemental oxygen at night. His CPAP usage is consistent, with 100% adherence over the last 30 days, and his apnea score is well controlled at 2. His breathing is currently stable, and he has not experienced any significant respiratory distress recently.  He has COPD and is a former smoker, having quit 62 years ago. He is on Spiriva  Respimat once daily for his COPD and uses supplemental oxygen at night or with exertion. No significant exacerbations of his COPD symptoms have occurred recently.  He is on a low-salt diet, coordinated with the assistance of a dietitian and the kitchen staff at his residence, to manage his heart failure. He actively monitors his salt intake and has had discussions with the food director and dietitian to ensure compliance with dietary recommendations.  No wheezing or significant respiratory distress. Breathing is stable without exacerbations of COPD symptoms.      Allergies  Allergen Reactions   Peanut-Containing Drug Products Anaphylaxis   Sulfonamide Derivatives Anaphylaxis   Amlodipine  Swelling and Other (See Comments)    Swelling in ankles   Eliquis  [Apixaban ] Other (See Comments)    Back/hip pain   Lisinopril  Cough   Xarelto  [Rivaroxaban ] Other (See Comments)    Back/hip pain    Immunization History  Administered Date(s) Administered   Fluad Quad(high Dose 65+) 05/06/2019,  05/30/2020, 06/18/2021, 05/27/2023   Hep A / Hep B 04/06/2012, 10/07/2012   Hepatitis B 05/07/2012   INFLUENZA, HIGH DOSE SEASONAL PF 05/15/2018, 05/31/2022   Influenza Split 06/28/2013   Influenza Whole 06/05/2007, 05/13/2008, 06/06/2009, 05/30/2010   Influenza,inj,Quad PF,6+ Mos 05/20/2016   Influenza-Unspecified 05/25/2014, 07/08/2015, 05/06/2017, 07/29/2023   Moderna Covid-19 Vaccine Bivalent Booster 83yrs & up 05/31/2022   PFIZER(Purple Top)SARS-COV-2 Vaccination 09/28/2019, 10/12/2019, 05/30/2020   PPD Test 03/31/2023   Pfizer Covid-19 Vaccine Bivalent Booster 44yrs & up 11/18/2021, 07/29/2023   Pneumococcal Conjugate-13 09/18/2015   Pneumococcal Polysaccharide-23 05/13/2008, 05/25/2014   Td 05/30/2010   Tdap 10/27/2020   Zoster Recombinant(Shingrix) 05/18/2018, 10/16/2018   Zoster, Live 10/21/2007    Past Medical History:  Diagnosis Date   Age-related macular degeneration, dry, both eyes    Allergic    24/7; 365 days/year; I'm allergic to pollens, dust, all [place] grasses/trees, mold, mildue, cats, dogs (01/07/2017)   Anal fissure    Asthma    sees Dr. Alaine    Benign prostatic hypertrophy    (sees Dr. Colan   CAD (coronary artery disease)    a. s/p  CABG 2007. b. Cath 08/2016 - 4/4 patent grafts.   Carotid bruit    carotid u/s 10/10: 0.39% bilaterally   Chronic combined systolic and diastolic CHF (congestive heart failure) (HCC)    Complication of anesthesia 1980s   w/anal cyst OR, he gave me a saddle block then put a narcotic in spinal cord; had a severe reaction to that (01/07/2017)   Congestive heart failure (CHF) [facility])    ED (erectile dysfunction)    Family history of adverse reaction to anesthesia    daughter wakes up during OR (01/07/2017)   GERD (gastroesophageal reflux disease)    Gout    HTN (hypertension)    Hx of colonic polyps    (sees Dr. Abran)   Hyperlipidemia    Hypothyroidism    Moderate to severe aortic stenosis    a. s/p TAVR  02/2017.   Myocardial infarction [facility] ~ 2000   Obesity    Osteoarthritis    was in my knees, hands (01/07/2017 )   PAF (paroxysmal atrial fibrillation) (HCC)    a. documented post TAVR.   Precancerous skin lesion    (sees Dr. 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   29 mm Edwards Sapien 3 transcatheter heart valve placed via percutaneous right transfemoral approach    Tobacco History: Social History   Tobacco Use  Smoking Status Former   Current packs/day: 0.00   Average packs/day: 3.5 packs/day for 13.0 years (45.5 ttl pk-yrs)   Types: Cigarettes   Start date: 15   Quit date: 1963   Years since quitting: 62.7  Smokeless Tobacco Never  Tobacco Comments   Former smoker 04/19/21   Counseling given: Not Answered Tobacco comments: Former smoker 04/19/21   Outpatient Medications Prior to Visit  Medication Sig Dispense Refill   albuterol  (VENTOLIN  HFA) 108 (90 Base) MCG/ACT inhaler Inhale 2 puffs into the lungs every 4 (four) hours as needed for wheezing or shortness of breath. 8 g 5   allopurinol  (ZYLOPRIM ) 100 MG tablet Take 1 tablet (100 mg total) by mouth daily. 90 tablet 3   amiodarone  (PACERONE ) 200 MG tablet Take 1 tablet (200 mg total) by mouth daily. 90 tablet 3   aspirin  81 MG tablet Take 1 tablet (81 mg total) by mouth daily. 30 tablet 0   atorvastatin  (LIPITOR) 20 MG tablet TAKE ONE TABLET ONCE DAILY 90 tablet 2   Baclofen 5 MG TABS Take 5 mg by mouth every 12 (twelve) hours as needed.     calcium  carbonate (OS-CAL) 1250 (500 Ca) MG chewable tablet Chew 1 tablet by mouth daily.     Calcium -Magnesium -Zinc-Vit D3 (CALCIUM -MAGNESIUM -ZINC-D3 PO) Take 2 tablets by mouth daily.     cetirizine (ZYRTEC) 10 MG tablet Take 10 mg by mouth 2 (two) times daily. As needed     colchicine  0.6 MG tablet Take 1 tablet (0.6 mg total) by mouth every 6 (six) hours as needed (gout). 60 tablet 5    dabigatran  (PRADAXA ) 150 MG CAPS capsule TAKE (1) CAPSULE TWICE DAILY. 180 capsule 2   diclofenac  Sodium (VOLTAREN ) 1 % GEL Apply 4 g topically 3 (three) times daily as needed (pain). 50 g 0   docusate sodium  (COLACE) 100 MG capsule Take 100 mg by mouth 2 (two) times daily.     fluticasone  (FLONASE ) 50 MCG/ACT nasal spray Place 2 sprays into both nostrils daily.     insulin   aspart (NOVOLOG ) 100 UNIT/ML injection Inject 4-16 Units into the skin as directed. Inject as per sliding scale: if 141 - 180 = 4 units Inject per sliding scale; 181 - 220 = 6 units Inject per sliding scale; 221 - 260 = 8 units Inject per sliding scale; 261 - 300 = 10 units Inject per sliding scale; 301 - 350 = 12 units Inject per sliding scale; 351 - 400= 14 units Inject per sliding scale; 401+ = 16 units Inject per sliding scale, subcutaneously three times a day     isosorbide  mononitrate (IMDUR ) 30 MG 24 hr tablet Take 1 tablet (30 mg total) by mouth daily. 90 tablet 3   ketotifen  (ZADITOR ) 0.025 % ophthalmic solution Place 1 drop into both eyes daily as needed (for itching).     levothyroxine  (SYNTHROID ) 175 MCG tablet Take 175 mcg by mouth daily.     metoCLOPramide  (REGLAN ) 10 MG tablet Take 10 mg by mouth 2 (two) times daily.     metolazone  (ZAROXOLYN ) 2.5 MG tablet Take 1 tablet (2.5 mg total) by mouth once a week every Saturday 12 tablet 3   metoprolol  succinate (TOPROL -XL) 100 MG 24 hr tablet Take 100 mg by mouth daily. Take with or immediately following a meal.     montelukast  (SINGULAIR ) 10 MG tablet TAKE ONE TABLET BY MOUTH ONCE DAILY IN THE EVENING 90 tablet 3   Multiple Vitamins-Minerals (MULTI-VITAMIN GUMMIES) CHEW Chew 2 Pieces by mouth daily.     Multiple Vitamins-Minerals (PRESERVISION AREDS 2) CAPS Take 1 capsule by mouth in the morning and at bedtime.     oxycodone  (OXY-IR) 5 MG capsule Take 5 mg by mouth every 8 (eight) hours as needed.     OXYGEN Inhale 2 L into the lungs as directed. Resident to wear O2  when ambulating around the unit and during therapy every shift     pantoprazole  (PROTONIX ) 40 MG tablet Take 40 mg by mouth 2 (two) times daily.     polyethylene glycol powder (GLYCOLAX/MIRALAX) 17 GM/SCOOP powder Take 17 g by mouth daily as needed for moderate constipation.     Potassium Chloride  ER 20 MEQ TBCR Take 4 tablets (80 mEq total) by mouth 2 (two) times daily.     Probiotic Product (PROBIOTIC PO) Take 1 capsule by mouth daily.     sacubitril-valsartan (ENTRESTO ) 24-26 MG Take 0.5 tablets by mouth 2 (two) times daily.     spironolactone  (ALDACTONE ) 25 MG tablet Take 1 tablet (25 mg total) by mouth daily. 30 tablet 9   tamsulosin (FLOMAX) 0.4 MG CAPS capsule Take 0.4 mg by mouth daily.     Tiotropium Bromide Monohydrate  (SPIRIVA  RESPIMAT) 2.5 MCG/ACT AERS Inhale 2 puffs into the lungs daily.     torsemide  (DEMADEX ) 20 MG tablet Take 4 tablets (80 mg total) by mouth in the morning AND 2 tablets (40 mg total) every evening. 120 tablet 3   triamcinolone  cream (KENALOG ) 0.1 % Apply 1 application  topically daily as needed for dry skin (affected areas).     valACYclovir  (VALTREX ) 500 MG tablet Take 500 mg by mouth every 12 (twelve) hours as needed (cold sores).     venlafaxine  XR (EFFEXOR  XR) 37.5 MG 24 hr capsule Take 1 capsule (37.5 mg total) by mouth daily with breakfast. 30 capsule 1   Facility-Administered Medications Prior to Visit  Medication Dose Route Frequency Provider Last Rate Last Admin   azithromycin  (ZITHROMAX ) tablet 500 mg  500 mg Oral Daily  Review of Systems  Review of Systems  Constitutional: Negative.   HENT: Negative.    Respiratory: Negative.  Negative for cough, shortness of breath and wheezing.   Cardiovascular:  Negative for leg swelling.   Physical Exam  BP 126/64   Pulse 90   Temp 97.7 F (36.5 C)   Ht 5' 6 (1.676 m)   Wt 264 lb 3.2 oz (119.8 kg)   SpO2 95% Comment: RA  BMI 42.64 kg/m  Physical Exam Constitutional:      General: He is  not in acute distress.    Appearance: Normal appearance. He is obese. He is not ill-appearing.  HENT:     Head: Normocephalic and atraumatic.  Cardiovascular:     Rate and Rhythm: Normal rate and regular rhythm.     Heart sounds: No murmur heard.    Comments: Trace LE edema Pulmonary:     Effort: Pulmonary effort is normal.     Breath sounds: Normal breath sounds. No wheezing, rhonchi or rales.  Abdominal:     Palpations: Abdomen is soft. There is no mass.     Tenderness: There is no abdominal tenderness. There is no guarding or rebound.  Skin:    General: Skin is warm and dry.  Neurological:     General: No focal deficit present.     Mental Status: He is alert and oriented to person, place, and time. Mental status is at baseline.  Psychiatric:        Mood and Affect: Mood normal.        Behavior: Behavior normal.        Thought Content: Thought content normal.        Judgment: Judgment normal.     Lab Results:  CBC    Component Value Date/Time   WBC 17.7 (H) 01/20/2024 1702   RBC 5.03 01/20/2024 1702   HGB 14.0 01/20/2024 1702   HGB 14.0 05/23/2023 1220   HCT 44.2 01/20/2024 1702   HCT 45.2 05/23/2023 1220   PLT 345 01/20/2024 1702   PLT 415 05/23/2023 1220   MCV 87.9 01/20/2024 1702   MCV 87 05/23/2023 1220   MCH 27.8 01/20/2024 1702   MCHC 31.7 01/20/2024 1702   RDW 19.3 (H) 01/20/2024 1702   RDW 15.9 (H) 05/23/2023 1220   LYMPHSABS 2.9 01/20/2024 1702   LYMPHSABS 2.0 04/23/2017 1012   MONOABS 1.2 (H) 01/20/2024 1702   EOSABS 0.4 01/20/2024 1702   EOSABS 0.3 04/23/2017 1012   BASOSABS 0.1 01/20/2024 1702   BASOSABS 0.1 04/23/2017 1012    BMET    Component Value Date/Time   NA 133 (L) 04/14/2024 1637   K 4.4 04/14/2024 1637   CL 93 (L) 04/14/2024 1637   CO2 24 04/14/2024 1637   GLUCOSE 99 04/14/2024 1637   GLUCOSE 131 (H) 01/20/2024 1702   GLUCOSE 87 06/30/2006 1132   BUN 33 (H) 04/14/2024 1637   CREATININE 1.50 (H) 04/14/2024 1637   CREATININE 1.62  (H) 10/14/2023 0729   CALCIUM  8.3 (L) 04/14/2024 1637   GFRNONAA 43 (L) 01/20/2024 1702   GFRAA 66 04/04/2020 1132    BNP    Component Value Date/Time   BNP 489.4 (H) 04/14/2024 1637   BNP 823.5 (H) 01/16/2024 1223    ProBNP    Component Value Date/Time   PROBNP 2,293 (H) 10/16/2022 1259   PROBNP 354.0 (H) 03/28/2021 0730    Imaging: No results found.   Assessment & Plan:    1. OSA (obstructive  sleep apnea) (Primary)  2. Restrictive lung disease  3. Chronic respiratory failure with hypoxia (HCC)  Assessment & Plan Congestive heart failure with fluid overload He has experienced weight gain, indicating fluid overload, which is being managed by cardiology. Diuretics have been adjusted, and he is currently on a high dose of torsemide  80mg  qam and 40mg qpm along with Zaroxolyn  2.5mg  weekly. Reports increased urination following the extra dose of diuretics, indicating effective diuresis. - Continue current diuretic regimen as adjusted by cardiology - Monitor weight daily - Check lab work tomorrow - Maintain low-salt diet  Chronic respiratory failure with hypoxia He is on supplemental oxygen, primarily at night or with exertion, at a rate of two liters. Oxygen saturation is currently 95% RA, indicating stable respiratory status. - Continue supplemental oxygen at two liters as needed  Obstructive sleep apnea He is using CPAP therapy consistently, with 100% usage over the last 30 days. CPAP settings are on auto, with a pressure range of 5 to 20, and his apnea score is 2, indicating well-controlled obstructive sleep apnea. - Continue CPAP therapy with current settings  Dyspnea Former smoker, having quit 62 years ago. Pulmonary function testing showed mild restrictive lung disease with diffusion defect. There was also mild curvature to flow volume loop. Currently on Spiriva  Respimat once a day. No evidence of COPD exacerbation at this time.  Dyspne largely related to heart  failure and obesity.   - Continue Spiriva  Respimat once daily  Hx pneumonia Patient was hospitalized in March for acute hypoxic respiratory failure secondary to covid and multifocal pneumonia. Right side infiltrate largely resolved on CT imaging in June, small amount of reticulation remains.   Pleural effusion - S/p thoracentesis in March with 1000cc bloody fluid removed exucative by LDH. Completed abx. Small bilateral pleural effusion on CT imaging with associated bibasilar atelectasis   Almarie LELON Ferrari, NP 05/26/2024

## 2024-05-26 NOTE — Telephone Encounter (Signed)
 Order faxed to [phone] [facility]

## 2024-05-26 NOTE — Telephone Encounter (Addendum)
 Call to director, Porter [phone] at [facility].  Explained [facility] needs fax number to fax medication order regarding pt Justin [name].  She stated the orders can be faxed to [phone].  Thanked her for the information and advised fax will be send shortly.  Provided fax number to Dalton Lyon at [facility] to refax the med order as recommended by [name], NP at [facility] clinic.

## 2024-05-27 ENCOUNTER — Encounter: Payer: Self-pay | Admitting: Nurse Practitioner

## 2024-05-27 ENCOUNTER — Telehealth: Payer: Self-pay

## 2024-05-27 ENCOUNTER — Ambulatory Visit: Attending: Cardiology

## 2024-05-27 DIAGNOSIS — M25561 Pain in right knee: Secondary | ICD-10-CM | POA: Diagnosis not present

## 2024-05-27 DIAGNOSIS — I5042 Chronic combined systolic (congestive) and diastolic (congestive) heart failure: Secondary | ICD-10-CM

## 2024-05-27 DIAGNOSIS — Z95810 Presence of automatic (implantable) cardiac defibrillator: Secondary | ICD-10-CM

## 2024-05-27 DIAGNOSIS — R2681 Unsteadiness on feet: Secondary | ICD-10-CM | POA: Diagnosis not present

## 2024-05-27 DIAGNOSIS — M25562 Pain in left knee: Secondary | ICD-10-CM | POA: Diagnosis not present

## 2024-05-27 DIAGNOSIS — J449 Chronic obstructive pulmonary disease, unspecified: Secondary | ICD-10-CM | POA: Insufficient documentation

## 2024-05-27 NOTE — Telephone Encounter (Signed)
 Spoke with daughter [name] Kops per DPR.  Requested to have patient send a manual transmission today for review of fluid levels.  She stated he may need assistance since he has trouble seeing and will call back if he needs assistance.

## 2024-05-27 NOTE — Telephone Encounter (Signed)
 Received call back from daughter and patient's facility director will call back for assistance in sending remote transmission.

## 2024-05-27 NOTE — Progress Notes (Signed)
 Spoke with daughter to advise of Brittainy Simmon's recommendation to increase torsemide  to 80 mg bid x 2 days, then reduce back to 80 qam/40 qpm. Needs BMP in 1 wk.     Advised I do not have the capability to fax the order to [facility] and have asked HF clinic to fax the order.   She will call the office in the morning to make sure the order gets faxed since I will not be in the office tomorrow.

## 2024-05-27 NOTE — Progress Notes (Signed)
 EPIC Encounter for ICM Monitoring  Patient Name: Justin [name] is a 89 y.o. male Date: 05/27/2024 Primary Care Physican: Mast, Man X, NP Primary Cardiologist: McAlhany/Sabharwal  Electrophysiologist:  [name] Pore Pacing: 77.1%  09/27/2022 Weight: 270 lbs 02/12/2023 Weight: 261 lbs 07/07/2023 Office Weight: 261 lbs 10/07/2023 Weight: 245-250 lbs 10/24/2023 Office Weight: 252 lbs 03/01/2024 Office Weight: 252 lbs 03/17/2024 Office Weight: 253 lbs 05/25/2024 Weight: 259 lbs 05/27/2024 Weight: 257 lbs after taking 05/26/2024 Metolazone    Time in AT/AF  24.0 hr/day (100.0%)   Spoke with daughter, Elward Nocera and also spoke with patient.  heart failure questions reviewed.  Transmission results reviewed. Pt reports he lost 2 pounds after taking extra Metolazone  & Potassium dosage on 05/26/2024.  He denies any fluid symptoms of swelling or SOB.     Diet: Restricting salt intake  Since 05/24/2024 ICM Remote Transmission, Optivol thoracic impedance suggesting no change after taking 05/26/2024 extra Metolazone  dosage. Continues to suggest possible fluid accumulation starting 04/30/2024.   Prescribed: Torsemide  20 mg take 4 tablets (80 mg total) by mouth every morning and 2 tablets (40 mg total) every afternoon    Potassium 20 mEq take 4 tablet(s) (80 mEq total) by mouth twice a day.     Metolazone  2.5 mg Take 1 tablet by mouth once a week every Saturday (per 7/15 phone note should take extra 40 mEq of Potassium with Metolazone ).   Spironolactone  25 mg take 1 tablet daily   Labs: 04/14/2024 Creatinine 1.50, BUN 33, Potassium 4.4, Sodium 133, GFR 45, BNP 489.4 03/25/2024 Creatinine 1.45, BUN 31, Potassium 4.0, Sodium 132, GFR 47 03/09/2024 Creatinine 1.43, BUN 32, Potassium 3.5, Sodium 131, GFR 47 02/19/2024 Creatinine 1.21, BUN 29, Potassium 4.3, Sodium 132, GFR 58 01/29/2024 Creatinine 1.25, BUN 20, Potassium 4.8, Sodium 135, GFR 56, BNP 454 01/20/2024 Creatinine 1.54, BUN 32, Potassium 3.4, Sodium  129, GFR 43  01/16/2024 BNP 823.5 12/23/2023 Creatinine 1.58, BUN 20, Potassium 3.7, Sodium 132, GFR 42, BNP 579.7 12/16/2023 Creatinine 1.3,   BUN 25, Potassium 3.9, Sodium 134  11/27/2023 Creatinine 1.32, BUN 46, Potassium 3.8, Sodium 134, GFR 52 A complete set of results can be found in Results Review.   Recommendations:  Copy sent to Caffie Shed, PA for review and recommendations if needed.  HF clinic will fax any orders to [facility] Fax number [phone].    Follow-up plan: ICM clinic phone appointment on 06/07/2024 to recheck fluid levels.   91 day device clinic remote transmission 06/01/2024.     EP/Cardiology Office Visits:   07/06/2024 with [name], PA.  08/03/2024 with [name].   Recall 09/14/2024 with [name].   Copy of ICM check sent to Dr [name].    Remote monitoring is medically necessary for Heart Failure Management.    90 day Daily Thoracic Impedance ICM trend: 02/26/2024 through 05/27/2024.    12-14 Month Thoracic Impedance ICM trend:     [name], RN 05/27/2024 11:37 AM

## 2024-05-27 NOTE — Progress Notes (Signed)
  Received: Today Marcine Caffie HERO, PA-C  Tylon Kemmerling, Mitzie RAMAN, RN; [place], [name], FNP Increase torsemide  to 80 mg bid x 2 days, then reduce back to 80 qam/40 qpm. Needs BMP in 1 wk.

## 2024-05-27 NOTE — Progress Notes (Signed)
 Brittainy I don't have the capability to fax the change to [facility] as they require.  Can you have someone at HF clinic fax the recommendation?  He is at [facility].  Fax number is [phone].

## 2024-05-28 NOTE — Progress Notes (Signed)
 Order faxed to [facility] @ [phone]

## 2024-05-31 DIAGNOSIS — M25562 Pain in left knee: Secondary | ICD-10-CM | POA: Diagnosis not present

## 2024-05-31 DIAGNOSIS — M25561 Pain in right knee: Secondary | ICD-10-CM | POA: Diagnosis not present

## 2024-05-31 DIAGNOSIS — R2681 Unsteadiness on feet: Secondary | ICD-10-CM | POA: Diagnosis not present

## 2024-06-01 ENCOUNTER — Telehealth: Payer: Self-pay | Admitting: *Deleted

## 2024-06-01 ENCOUNTER — Ambulatory Visit (INDEPENDENT_AMBULATORY_CARE_PROVIDER_SITE_OTHER): Payer: Medicare Other

## 2024-06-01 DIAGNOSIS — I5042 Chronic combined systolic (congestive) and diastolic (congestive) heart failure: Secondary | ICD-10-CM | POA: Diagnosis not present

## 2024-06-01 NOTE — Telephone Encounter (Signed)
 Per 05/26/24 AVS: INSTRUCTIONS: Please monitor your weight daily and check your lab work tomorrow. Continue with your current medications and therapies as discussed. Maintain your low-salt diet and follow up with cardiology as needed.  Copied from CRM #[id number]. Topic: Clinical - Request for Lab/Test Order >> May 26, 2024  4:44 PM Devaughn RAMAN wrote: Reason for CRM: Tameka with [facility] called regarding lab work for patient, Renea stated the AVS stated the patient needs to check lab work tomorrow. No order for labs currently in chart. Tameka stated a lab order is typically sent over and the patient completes the lab at [facility] to draw his labs as she stated they do not have an order for labs for the patient. Renea can be contacted at [phone] ext: [phone] or [phone] ext: [phone]

## 2024-06-01 NOTE — Telephone Encounter (Signed)
 ATC Tameka x1.  Left detailed message on her VM letting her know what Almarie Ferrari NP had stated.  If they have any further questions they can call us  back at [phone].

## 2024-06-01 NOTE — Telephone Encounter (Signed)
 The labs should have been ordered by advance HF clinic per note on 05/26/24, they manage his diuretics and recently adjusted his medication and wanted follow-up lab work. Would recommend they reach out to HF clinic and speak with Waynette Kenner or Mitzie Garner

## 2024-06-02 ENCOUNTER — Telehealth: Payer: Self-pay | Admitting: *Deleted

## 2024-06-02 NOTE — Telephone Encounter (Signed)
 Copied from CRM #[id number]. Topic: Clinical - Request for Lab/Test Order >> May 26, 2024  4:44 PM Devaughn RAMAN wrote: Reason for CRM: Tameka with [facility] called regarding lab work for patient, Renea stated the AVS stated the patient needs to check lab work tomorrow. No order for labs currently in chart. Tameka stated a lab order is typically sent over and the patient completes the lab at [facility] to draw his labs as she stated they do not have an order for labs for the patient. Renea can be contacted at [phone]: [phone] or [phone] ext: [phone]  Duplicate  See phone note 06/01/24

## 2024-06-02 NOTE — Telephone Encounter (Signed)
 Called [facility] spoke to [facility] who is aware HF clinic will manage patient's labs. NFN

## 2024-06-03 DIAGNOSIS — I509 Heart failure, unspecified: Secondary | ICD-10-CM | POA: Diagnosis not present

## 2024-06-04 DIAGNOSIS — R2681 Unsteadiness on feet: Secondary | ICD-10-CM | POA: Diagnosis not present

## 2024-06-04 DIAGNOSIS — M25561 Pain in right knee: Secondary | ICD-10-CM | POA: Diagnosis not present

## 2024-06-04 DIAGNOSIS — M25562 Pain in left knee: Secondary | ICD-10-CM | POA: Diagnosis not present

## 2024-06-04 NOTE — Progress Notes (Signed)
 Remote ICD Transmission

## 2024-06-07 ENCOUNTER — Ambulatory Visit: Attending: Cardiology

## 2024-06-07 DIAGNOSIS — Z95810 Presence of automatic (implantable) cardiac defibrillator: Secondary | ICD-10-CM

## 2024-06-07 DIAGNOSIS — I5042 Chronic combined systolic (congestive) and diastolic (congestive) heart failure: Secondary | ICD-10-CM

## 2024-06-07 LAB — CUP PACEART REMOTE DEVICE CHECK
Battery Remaining Longevity: 70 mo
Battery Voltage: 2.97 V
Brady Statistic RV Percent Paced: 78.91 %
Date Time Interrogation Session: 20251010164130
HighPow Impedance: 74 Ohm
Implantable Lead Connection Status: 753985
Implantable Lead Connection Status: 753985
Implantable Lead Connection Status: 753985
Implantable Lead Implant Date: 20180907
Implantable Lead Implant Date: 20180907
Implantable Lead Implant Date: 20180907
Implantable Lead Location: 753858
Implantable Lead Location: 753859
Implantable Lead Location: 753860
Implantable Lead Model: 4396
Implantable Lead Model: 5076
Implantable Pulse Generator Implant Date: 20241004
Lead Channel Impedance Value: 1102 Ohm
Lead Channel Impedance Value: 247 Ohm
Lead Channel Impedance Value: 323 Ohm
Lead Channel Impedance Value: 342 Ohm
Lead Channel Impedance Value: 532 Ohm
Lead Channel Impedance Value: 627 Ohm
Lead Channel Pacing Threshold Amplitude: 1.125 V
Lead Channel Pacing Threshold Amplitude: 1.625 V
Lead Channel Pacing Threshold Pulse Width: 0.4 ms
Lead Channel Pacing Threshold Pulse Width: 0.8 ms
Lead Channel Sensing Intrinsic Amplitude: 0.6 mV
Lead Channel Sensing Intrinsic Amplitude: 16.8 mV
Lead Channel Setting Pacing Amplitude: 1.75 V
Lead Channel Setting Pacing Amplitude: 2 V
Lead Channel Setting Pacing Amplitude: 2.5 V
Lead Channel Setting Pacing Pulse Width: 0.4 ms
Lead Channel Setting Pacing Pulse Width: 0.8 ms
Lead Channel Setting Sensing Sensitivity: 0.3 mV
Zone Setting Status: 755011
Zone Setting Status: 755011

## 2024-06-07 NOTE — Progress Notes (Signed)
 EPIC Encounter for ICM Monitoring  Patient Name: Justin [name] is a 89 y.o. male Date: 06/07/2024 Primary Care Physican: Mast, Man X, NP Primary Cardiologist: McAlhany/Sabharwal  Electrophysiologist:  [name] Pore Pacing: 85.0%  09/27/2022 Weight: 270 lbs 02/12/2023 Weight: 261 lbs 07/07/2023 Office Weight: 261 lbs 10/07/2023 Weight: 245-250 lbs 10/24/2023 Office Weight: 252 lbs 03/01/2024 Office Weight: 252 lbs 03/17/2024 Office Weight: 253 lbs 05/25/2024 Weight: 259 lbs 05/27/2024 Weight: 257 lbs after taking 05/26/2024 Metolazone    Time in AT/AF  24.0 hr/day (100.0%)   Spoke with daughter, [name] and also spoke with patient.  heart failure questions reviewed.  Transmission results reviewed.   Pt was a little SOB on 06/06/2024 but quickly resolved.    Diet: Restricting salt intake   Since 05/24/2024 ICM Remote Transmission, Optivol thoracic impedance suggesting fluid levels returned to normal 05/27/2024 after receiving extra Metolazone  dosage.    Prescribed: Torsemide  20 mg take 4 tablets (80 mg total) by mouth every morning and 2 tablets (40 mg total) every afternoon    Potassium 20 mEq take 4 tablet(s) (80 mEq total) by mouth twice a day.     Metolazone  2.5 mg Take 1 tablet by mouth once a week every Saturday (per 7/15 phone note should take extra 40 mEq of Potassium with Metolazone ).   Spironolactone  25 mg take 1 tablet daily   Labs: 04/14/2024 Creatinine 1.50, BUN 33, Potassium 4.4, Sodium 133, GFR 45, BNP 489.4 03/25/2024 Creatinine 1.45, BUN 31, Potassium 4.0, Sodium 132, GFR 47 03/09/2024 Creatinine 1.43, BUN 32, Potassium 3.5, Sodium 131, GFR 47 A complete set of results can be found in Results Review.   Recommendations:   No changes and encouraged to call if experiencing any fluid symptoms.   Follow-up plan: ICM clinic phone appointment on 06/21/2024 to check stability of fluid levels.   91 day device clinic remote transmission 08/31/2024.     EP/Cardiology Office Visits:    07/06/2024 with [name], PA.  08/03/2024 with [name].   Recall 09/14/2024 with [name].   Copy of ICM check sent to Dr [name].     Remote monitoring is medically necessary for Heart Failure Management.    90 day Daily Thoracic Impedance ICM trend: 03/08/2024 through 06/07/2024.    12-14 Month Thoracic Impedance ICM trend:     [name], RN 06/07/2024 4:07 PM

## 2024-06-08 DIAGNOSIS — M25562 Pain in left knee: Secondary | ICD-10-CM | POA: Diagnosis not present

## 2024-06-08 DIAGNOSIS — M25561 Pain in right knee: Secondary | ICD-10-CM | POA: Diagnosis not present

## 2024-06-08 DIAGNOSIS — R2681 Unsteadiness on feet: Secondary | ICD-10-CM | POA: Diagnosis not present

## 2024-06-08 NOTE — Progress Notes (Signed)
 Remote ICD Transmission

## 2024-06-10 ENCOUNTER — Other Ambulatory Visit (HOSPITAL_COMMUNITY): Payer: Self-pay | Admitting: Cardiology

## 2024-06-10 DIAGNOSIS — R2681 Unsteadiness on feet: Secondary | ICD-10-CM | POA: Diagnosis not present

## 2024-06-10 DIAGNOSIS — M25562 Pain in left knee: Secondary | ICD-10-CM | POA: Diagnosis not present

## 2024-06-10 DIAGNOSIS — M25561 Pain in right knee: Secondary | ICD-10-CM | POA: Diagnosis not present

## 2024-06-15 DIAGNOSIS — M25561 Pain in right knee: Secondary | ICD-10-CM | POA: Diagnosis not present

## 2024-06-15 DIAGNOSIS — M25562 Pain in left knee: Secondary | ICD-10-CM | POA: Diagnosis not present

## 2024-06-15 DIAGNOSIS — R2681 Unsteadiness on feet: Secondary | ICD-10-CM | POA: Diagnosis not present

## 2024-06-17 DIAGNOSIS — R2681 Unsteadiness on feet: Secondary | ICD-10-CM | POA: Diagnosis not present

## 2024-06-17 DIAGNOSIS — M25562 Pain in left knee: Secondary | ICD-10-CM | POA: Diagnosis not present

## 2024-06-17 DIAGNOSIS — M25561 Pain in right knee: Secondary | ICD-10-CM | POA: Diagnosis not present

## 2024-06-19 ENCOUNTER — Ambulatory Visit: Payer: Self-pay | Admitting: Cardiology

## 2024-06-21 ENCOUNTER — Ambulatory Visit: Attending: Cardiology

## 2024-06-21 DIAGNOSIS — I5042 Chronic combined systolic (congestive) and diastolic (congestive) heart failure: Secondary | ICD-10-CM

## 2024-06-21 DIAGNOSIS — Z95810 Presence of automatic (implantable) cardiac defibrillator: Secondary | ICD-10-CM

## 2024-06-22 NOTE — Progress Notes (Signed)
 EPIC Encounter for ICM Monitoring  Patient Name: Justin [name] is a 89 y.o. male Date: 06/22/2024 Primary Care Physican: Mast, Man X, NP Primary Cardiologist: McAlhany/Sabharwal  Electrophysiologist:  [name] Pore Pacing: 76.2%  09/27/2022 Weight: 270 lbs 02/12/2023 Weight: 261 lbs 07/07/2023 Office Weight: 261 lbs 10/07/2023 Weight: 245-250 lbs 10/24/2023 Office Weight: 252 lbs 03/01/2024 Office Weight: 252 lbs 03/17/2024 Office Weight: 253 lbs 05/25/2024 Weight: 259 lbs 05/27/2024 Weight: 257 lbs after taking 05/26/2024 Metolazone    Time in AT/AF  24.0 hr/day (100.0%)   Transmission results reviewed.       Diet: Restricting salt intake   Since 06/07/2024 ICM Remote Transmission: Optivol thoracic impedance suggesting intermittent days with possible fluid accumulation since 06/01/2024.    Prescribed: Torsemide  20 mg take 4 tablets (80 mg total) by mouth every morning and 2 tablets (40 mg total) every afternoon    Potassium 20 mEq take 4 tablet(s) (80 mEq total) by mouth twice a day.     Metolazone  2.5 mg Take 1 tablet by mouth once a week every Saturday (per 7/15 phone note should take extra 40 mEq of Potassium with Metolazone ).   Spironolactone  25 mg take 1 tablet daily   Labs: 06/03/2024 Creatinine 1.78, BUN 46, Potassium 5.1, Sodium 136, GFR 36 04/14/2024 Creatinine 1.50, BUN 33, Potassium 4.4, Sodium 133, GFR 45, BNP 489.4 03/25/2024 Creatinine 1.45, BUN 31, Potassium 4.0, Sodium 132, GFR 47 03/09/2024 Creatinine 1.43, BUN 32, Potassium 3.5, Sodium 131, GFR 47 A complete set of results can be found in Results Review.   Recommendations:   No changes.   Follow-up plan: ICM clinic phone appointment on 07/05/2024.   91 day device clinic remote transmission 08/31/2024.     EP/Cardiology Office Visits:   07/06/2024 with [name], PA.  08/03/2024 with [name].   Recall 09/14/2024 with [name].   Copy of ICM check sent to Dr [name].    Remote monitoring is medically  necessary for Heart Failure Management.    Daily Thoracic Impedance ICM trend: 03/22/2024 through 06/21/2024.    12-14 Month Thoracic Impedance ICM trend:     [name], RN 06/22/2024 10:41 AM

## 2024-06-28 ENCOUNTER — Encounter: Payer: Self-pay | Admitting: Radiology

## 2024-07-05 ENCOUNTER — Ambulatory Visit: Attending: Cardiology

## 2024-07-05 DIAGNOSIS — Z95810 Presence of automatic (implantable) cardiac defibrillator: Secondary | ICD-10-CM

## 2024-07-05 DIAGNOSIS — I5042 Chronic combined systolic (congestive) and diastolic (congestive) heart failure: Secondary | ICD-10-CM

## 2024-07-05 NOTE — Progress Notes (Signed)
 EPIC Encounter for ICM Monitoring  Patient Name: Justin [name] is a 89 y.o. male Date: 07/05/2024 Primary Care Physican: Mast, Man X, NP Primary Cardiologist: McAlhany/Sabharwal  Electrophysiologist:  [name] Pore Pacing: 74.2%  09/27/2022 Weight: 270 lbs 02/12/2023 Weight: 261 lbs 07/07/2023 Office Weight: 261 lbs 10/07/2023 Weight: 245-250 lbs 10/24/2023 Office Weight: 252 lbs 03/01/2024 Office Weight: 252 lbs 03/17/2024 Office Weight: 253 lbs 05/25/2024 Weight: 259 lbs 05/27/2024 Weight: 257 lbs after taking 05/26/2024 Metolazone    Time in AT/AF  24.0 hr/day (100.0%)   Transmission results reviewed.     Diet: Restricting salt intake   Since 06/07/2024 ICM Remote Transmission: Optivol thoracic impedance suggesting intermittent days with possible fluid accumulation since 06/23/2024 but trending back to baseline.    Prescribed: Torsemide  20 mg take 4 tablets (80 mg total) by mouth every morning and 2 tablets (40 mg total) every afternoon    Potassium 20 mEq take 4 tablet(s) (80 mEq total) by mouth twice a day.     Metolazone  2.5 mg Take 1 tablet by mouth once a week every Saturday (per 7/15 phone note should take extra 40 mEq of Potassium with Metolazone ).   Spironolactone  25 mg take 1 tablet daily   Labs: 06/03/2024 Creatinine 1.78, BUN 46, Potassium 5.1, Sodium 136, GFR 36 04/14/2024 Creatinine 1.50, BUN 33, Potassium 4.4, Sodium 133, GFR 45, BNP 489.4 03/25/2024 Creatinine 1.45, BUN 31, Potassium 4.0, Sodium 132, GFR 47 03/09/2024 Creatinine 1.43, BUN 32, Potassium 3.5, Sodium 131, GFR 47 A complete set of results can be found in Results Review.   Recommendations:  Pt has in office defib check on 07/06/2024 and any recommendations will be given at that time.    Follow-up plan: ICM clinic phone appointment on 07/12/2024 to recheck fluid levels.   91 day device clinic remote transmission 08/31/2024.     EP/Cardiology Office Visits:   07/06/2024 with [name], PA.  08/03/2024 with  [name].   Recall 09/14/2024 with [name].   Copy of ICM check sent to Dr [name].    Remote monitoring is medically necessary for Heart Failure Management.    Daily Thoracic Impedance ICM trend: 04/05/2024 through 07/05/2024.    12-14 Month Thoracic Impedance ICM trend:     [name], RN 07/05/2024 1:04 PM

## 2024-07-06 ENCOUNTER — Ambulatory Visit: Attending: Student | Admitting: Student

## 2024-07-06 NOTE — Progress Notes (Deleted)
  Electrophysiology Office Note:   ID:  Kailan, Laws May 08, 1934, MRN [id number]  Primary Cardiologist: [name], MD Electrophysiologist: Fonda Kitty, MD  {Click to update primary MD,subspecialty MD or APP then REFRESH:1}    History of Present Illness:   Justin [name] is a 89 y.o. male with h/o VT, SND s/p PPM, Persistent AF/AFL, s/p TAVR, obesity, PVCs, and chronic systolic CH seen today for routine electrophysiology followup.   Since last being seen in our clinic the patient reports doing ***.  he denies chest pain, palpitations, dyspnea, PND, orthopnea, nausea, vomiting, dizziness, syncope, edema, weight gain, or early satiety.   Review of systems complete and found to be negative unless listed in HPI.   EP Information / Studies Reviewed:    EKG is not ordered today. EKG from 04/05/2024 reviewed which showed V pacing at 83 bpm, underlying AF       ICD Interrogation-  reviewed in detail today,  See PACEART report.  Arrhythmia/Device History Medtronic BiV ICD implanted 04/2017 for syncope and new LBBB following TAVR Gen change 05/2023   Physical Exam:   VS:  There were no vitals taken for this visit.   Wt Readings from Last 3 Encounters:  05/26/24 264 lb 3.2 oz (119.8 kg)  05/17/24 254 lb 12.8 oz (115.6 kg)  04/28/24 252 lb 3.2 oz (114.4 kg)     GEN: No acute distress *** NECK: No JVD; No carotid bruits CARDIAC: {EPRHYTHM:28826}, no murmurs, rubs, gallops RESPIRATORY:  Clear to auscultation without rales, wheezing or rhonchi  ABDOMEN: Soft, non-tender, non-distended EXTREMITIES:  {EDEMA LEVEL:28147::No} edema; No deformity   ASSESSMENT AND PLAN:    Chronic systolic CHF  s/p Medtronic CRT-D  euvolemic today Stable on an appropriate medical regimen Normal ICD function See Pace Art report No changes today  Permanent AF Secondary hypercoagulable state High risk medication monitoring - Amiodarone  Continue amiodarone  200 mg daily Interferes  with BiV pacing%.   At higher LRL, still only BiV pacing ***% of the time Increase toprol  *** Will tentatively need AV nodal ablation if continued BB titration fails to improve BIV%.   PVCs Further confounds BIV%.  Disposition:   Follow up with {EPPROVIDERS:28135::EP Team} {EPFOLLOW UP:28173}   Signed, Ozell Prentice Passey, PA-C

## 2024-07-07 ENCOUNTER — Telehealth: Payer: Self-pay

## 2024-07-07 NOTE — Telephone Encounter (Signed)
 Returned call to daughter Denzel Etienne per DPR and voice mail message.  She provided 3 way call to speak with patient.   He reports more SOB over the last few days and his weight has increased from 257 lbs to 268 lbs over the past 3 weeks. Assisted in sending remote transmission for review of fluid levels.  Optivol thoracic impedance suggesting possible fluid accumulation starting 06/23/2024 and worsening on 07/02/2024.    Optivol suggests difficulty maintaining balance fluid levels.  Confirmed he continues to take Metolazone  2.5 mg every Saturday and has not missed any doses.  At the direction of HF clinic he did take extra Metolazone  on 05/26/2024.     Sent to [name] NP at [facility] clinic for review and recommendations.     Please fax orders to [facility] at [phone].  Sent to HF clinic triage so any orders can be faxed if needed.

## 2024-07-07 NOTE — Telephone Encounter (Signed)
 Orders faxed to facility and they have been called and verbal order given as well.

## 2024-07-07 NOTE — Telephone Encounter (Signed)
 Spoke with daughter with [facility] and advised of Jessica [name]'s recommendations. Advised the orders have been faxed to the facility.  She changed the metolazone  to 5 mg dose and scheduled for every Sunday and Wednesday. 40 KCL with be given metolazone  doses. He will have a BMET in 1 week. She appreciated the call back.  Fluid levels will be checked 07/12/2024.

## 2024-07-12 ENCOUNTER — Ambulatory Visit: Attending: Cardiology

## 2024-07-12 DIAGNOSIS — Z95810 Presence of automatic (implantable) cardiac defibrillator: Secondary | ICD-10-CM

## 2024-07-12 DIAGNOSIS — I5042 Chronic combined systolic (congestive) and diastolic (congestive) heart failure: Secondary | ICD-10-CM

## 2024-07-12 MED ORDER — POTASSIUM CHLORIDE ER 20 MEQ PO TBCR: 80.0000 meq | EXTENDED_RELEASE_TABLET | Freq: Two times a day (BID) | ORAL | 2 refills | Status: DC

## 2024-07-12 MED ORDER — METOLAZONE 5 MG PO TABS: ORAL_TABLET | ORAL | 1 refills | Status: DC

## 2024-07-12 NOTE — Progress Notes (Unsigned)
  Received: Today [name], [name], FNP  Thornton Dohrmann, Mitzie RAMAN, RN; [facility] Clinical Pool Yeah lets get him in soon with APP (this week or next, APP or swing) for acute visit

## 2024-07-12 NOTE — Progress Notes (Unsigned)
 Spoke with daughter, Aidan Moten and advised recommendation from [name], NP at [facility] clinic and Dr [name] is for patient to be seen in HF clinic this week or next week.   She agreed and hopes to have his fluid levels resolved before the Thanksgiving holiday.

## 2024-07-12 NOTE — Progress Notes (Unsigned)
 EPIC Encounter for ICM Monitoring  Patient Name: Justin [name] is a 89 y.o. male Date: 07/12/2024 Primary Care Physican: Mast, Man X, NP Primary Cardiologist: McAlhany/Sabharwal  Electrophysiologist:  [name] Pore Pacing: 80.2%  09/27/2022 Weight: 270 lbs 02/12/2023 Weight: 261 lbs 07/07/2023 Office Weight: 261 lbs 10/07/2023 Weight: 245-250 lbs 10/24/2023 Office Weight: 252 lbs 03/01/2024 Office Weight: 252 lbs 03/17/2024 Office Weight: 253 lbs 05/25/2024 Weight: 259 lbs 05/27/2024 Weight: 257 lbs after taking 05/26/2024 Metolazone    Time in AT/AF  24.0 hr/day (100.0%)   Spoke with daughter Emilo Gras and heart failure questions reviewed.  Transmission results reviewed.  She reports she spoke to patient today and he was very SOB walking from the bathroom and she thinks he still has fluid.  She confirmed he he had Metolazone  last Wednesday and yesterday, 07/11/2024.   Diet: Restricting salt intake   Since 06/07/2024 ICM Remote Transmission: Optivol thoracic impedance suggesting possible fluid accumulation continues after Metolazone  dosage increased to 5 mg twice a week (starting 07/07/2024).    Prescribed: Torsemide  20 mg take 4 tablets (80 mg total) by mouth every morning and 2 tablets (40 mg total) every afternoon    Potassium 20 mEq take 4 tablet(s) (80 mEq total) by mouth twice a day. Additional 40 mEq with each Metolazone  dose.     Metolazone  5 mg Take 1 tablet by mouth twice a week every Sunday & Wednesday with 40 mEq of Potassium with each Metolazone  dose (starting 07/07/2024) .   Spironolactone  25 mg take 1 tablet daily   Labs: 06/03/2024 Creatinine 1.78, BUN 46, Potassium 5.1, Sodium 136, GFR 36 04/14/2024 Creatinine 1.50, BUN 33, Potassium 4.4, Sodium 133, GFR 45, BNP 489.4 03/25/2024 Creatinine 1.45, BUN 31, Potassium 4.0, Sodium 132, GFR 47 03/09/2024 Creatinine 1.43, BUN 32, Potassium 3.5, Sodium 131, GFR 47 A complete set of results can be found in Results Review.    Recommendations:  Advised will send copy to [facility] NP and check if option for patient to be seen in the office since higher dose of Metolazone  5 mg has not improved fluid levels.    Follow-up plan: ICM clinic phone appointment on 07/19/2024 to recheck fluid levels.  91 day device clinic remote transmission 08/31/2024.     EP/Cardiology Office Visits:   07/16/2024 with [name], PA.  08/03/2024 with [name].   Recall 09/14/2024 with [name].   Copy of ICM check sent to Dr [name].    Remote monitoring is medically necessary for Heart Failure Management.    Daily Thoracic Impedance ICM trend: 04/12/2024 through 07/12/2024.    12-14 Month Thoracic Impedance ICM trend:     [name], RN 07/12/2024 8:55 AM

## 2024-07-12 NOTE — Progress Notes (Unsigned)
  Received: Today [name] Chew, MD  Ralph Brouwer, Mitzie RAMAN, RN Agree with clinic visit with HF team.

## 2024-07-13 NOTE — Progress Notes (Signed)
 Pt is scheduled for 07/15/2024 HF clinic office visit.

## 2024-07-14 ENCOUNTER — Telehealth (HOSPITAL_COMMUNITY): Payer: Self-pay

## 2024-07-14 NOTE — Telephone Encounter (Signed)
 Called to confirm/remind patient of their appointment at the [facility] on 07/15/24.   Appointment:   [x] Confirmed  [] Left mess   [] No answer/No voice mail  [] VM Full/unable to leave message  [] Phone not in service  Patient reminded to bring all medications and/or complete list.  Confirmed patient has transportation. Gave directions, instructed to utilize valet parking.

## 2024-07-15 ENCOUNTER — Ambulatory Visit (HOSPITAL_COMMUNITY): Payer: Self-pay | Admitting: Cardiology

## 2024-07-15 ENCOUNTER — Ambulatory Visit (HOSPITAL_COMMUNITY)
Admission: RE | Admit: 2024-07-15 | Discharge: 2024-07-15 | Disposition: A | Discharge status: Home / Self Care | Source: Ambulatory Visit | Attending: Cardiology | Admitting: Cardiology

## 2024-07-15 ENCOUNTER — Encounter (HOSPITAL_COMMUNITY): Payer: Self-pay

## 2024-07-15 VITALS — BP 92/62 | HR 99 | Ht 66.0 in | Wt 269.4 lb

## 2024-07-15 DIAGNOSIS — Z95810 Presence of automatic (implantable) cardiac defibrillator: Secondary | ICD-10-CM | POA: Diagnosis not present

## 2024-07-15 DIAGNOSIS — E8770 Fluid overload, unspecified: Secondary | ICD-10-CM | POA: Diagnosis not present

## 2024-07-15 DIAGNOSIS — Z952 Presence of prosthetic heart valve: Secondary | ICD-10-CM | POA: Diagnosis not present

## 2024-07-15 DIAGNOSIS — Z951 Presence of aortocoronary bypass graft: Secondary | ICD-10-CM | POA: Insufficient documentation

## 2024-07-15 DIAGNOSIS — Z79899 Other long term (current) drug therapy: Secondary | ICD-10-CM | POA: Insufficient documentation

## 2024-07-15 DIAGNOSIS — I4821 Permanent atrial fibrillation: Secondary | ICD-10-CM | POA: Diagnosis not present

## 2024-07-15 DIAGNOSIS — Z79890 Hormone replacement therapy (postmenopausal): Secondary | ICD-10-CM | POA: Insufficient documentation

## 2024-07-15 DIAGNOSIS — I5022 Chronic systolic (congestive) heart failure: Secondary | ICD-10-CM | POA: Diagnosis present

## 2024-07-15 DIAGNOSIS — I350 Nonrheumatic aortic (valve) stenosis: Secondary | ICD-10-CM | POA: Diagnosis not present

## 2024-07-15 DIAGNOSIS — I5042 Chronic combined systolic (congestive) and diastolic (congestive) heart failure: Secondary | ICD-10-CM | POA: Diagnosis not present

## 2024-07-15 DIAGNOSIS — I493 Ventricular premature depolarization: Secondary | ICD-10-CM | POA: Diagnosis not present

## 2024-07-15 DIAGNOSIS — E039 Hypothyroidism, unspecified: Secondary | ICD-10-CM | POA: Diagnosis not present

## 2024-07-15 DIAGNOSIS — Z7982 Long term (current) use of aspirin: Secondary | ICD-10-CM | POA: Insufficient documentation

## 2024-07-15 DIAGNOSIS — N189 Chronic kidney disease, unspecified: Secondary | ICD-10-CM | POA: Diagnosis not present

## 2024-07-15 DIAGNOSIS — Z8616 Personal history of COVID-19: Secondary | ICD-10-CM | POA: Insufficient documentation

## 2024-07-15 DIAGNOSIS — Z7901 Long term (current) use of anticoagulants: Secondary | ICD-10-CM | POA: Insufficient documentation

## 2024-07-15 DIAGNOSIS — I2510 Atherosclerotic heart disease of native coronary artery without angina pectoris: Secondary | ICD-10-CM | POA: Insufficient documentation

## 2024-07-15 DIAGNOSIS — R0600 Dyspnea, unspecified: Secondary | ICD-10-CM | POA: Insufficient documentation

## 2024-07-15 LAB — BASIC METABOLIC PANEL WITH GFR
Anion gap: 13 (ref 5–15)
BUN: 52 mg/dL — ABNORMAL HIGH (ref 8–23)
CO2: 28 mmol/L (ref 22–32)
Calcium: 8.3 mg/dL — ABNORMAL LOW (ref 8.9–10.3)
Chloride: 91 mmol/L — ABNORMAL LOW (ref 98–111)
Creatinine, Ser: 2.27 mg/dL — ABNORMAL HIGH (ref 0.61–1.24)
GFR, Estimated: 27 mL/min — ABNORMAL LOW (ref >60)
Glucose, Bld: 90 mg/dL (ref 70–99)
Potassium: 4.3 mmol/L (ref 3.5–5.1)
Sodium: 132 mmol/L — ABNORMAL LOW (ref 135–145)

## 2024-07-15 LAB — LAB REPORT - SCANNED: EGFR: 30

## 2024-07-15 LAB — BRAIN NATRIURETIC PEPTIDE: B Natriuretic Peptide: 653.6 pg/mL — ABNORMAL HIGH (ref 0.0–100.0)

## 2024-07-15 MED ORDER — TORSEMIDE 20 MG PO TABS
ORAL_TABLET | ORAL | Status: AC
Start: 2024-07-15 — End: ?

## 2024-07-15 NOTE — Telephone Encounter (Signed)
 Pts daughter Emmie and Tillman with SNF [facility] aware) and voiced understanding

## 2024-07-15 NOTE — Patient Instructions (Addendum)
 Medication Changes:  INCREASE TORSEMIDE  TO 80MG  TWICE DAILY   TAKE METOLAZONE  5MG  TODAY AND AGAIN ON SATURDAY---PLEASE TAKE AN EXTRA OF POTASSIUM WITH THIS   PLEASE RESUME NORMAL METOLAZONE  DOSING TWICE WEEKLY ON SUNDAY/WEDNESDAY FOLLOWING THE ABOVE CHANGES   Lab Work:  Labs done today, your results will be available in MyChart, we will contact you for abnormal readings.  Special Instructions // Education:  PLEASE RESTRICT FLUID INTAKE  Follow-Up in: 4 WEEKS AS SCHEDULED   At the [facility], you and your health needs are our priority. We have a designated team specialized in the treatment of Heart Failure. This Care Team includes your primary Heart Failure Specialized Cardiologist (physician), Advanced Practice Providers (APPs- Physician Assistants and Nurse Practitioners), and Pharmacist who all work together to provide you with the care you need, when you need it.   You may see any of the following providers on your designated Care Team at your next follow up:  Dr. Toribio Fuel Dr. Ezra Shuck Dr. Odis Brownie Greig Mosses, NP Caffie Shed, [place] [facility], [place] Beckey Coe, NP Jordan Lee, NP Tinnie Redman, PharmD   Please be sure to bring in all your medications bottles to every appointment.   Need to Contact Us :  If you have any questions or concerns before your next appointment please send us  a message through [place] or call our office at [phone].    TO LEAVE A MESSAGE FOR THE NURSE SELECT OPTION 2, PLEASE LEAVE A MESSAGE INCLUDING: YOUR NAME DATE OF BIRTH CALL BACK NUMBER REASON FOR CALL**this is important as we prioritize the call backs  YOU WILL RECEIVE A CALL BACK THE SAME DAY AS LONG AS YOU CALL BEFORE 4:00 PM

## 2024-07-15 NOTE — Progress Notes (Signed)
  Electrophysiology Office Note:   ID:  Abass, Misener Mar 29, 1934, MRN [id number]  Primary Cardiologist: [name], MD Electrophysiologist: Fonda Kitty, MD      History of Present Illness:   USTIN CRUICKSHANK is a 89 y.o. male with h/o VT, SND s/p PPM, Persistent AF/AFL, s/p TAVR, obesity, PVCs, and chronic systolic CH seen today for routine electrophysiology followup.   Since last being seen in our clinic the patient reports doing OK. He has been holding on to fluid for about 2 weeks. Seen in HF clinic yesterday and torsemide  and metolazone  adjusted. BMET with AKI and BNP elevated. Plans for more diuretics over weekend and recheck lab on Tuesday.   Review of systems complete and found to be negative unless listed in HPI.   EP Information / Studies Reviewed:    EKG is not ordered today. EKG from 04/05/2024 reviewed which showed V pacing at 83 bpm, underlying AF       ICD Interrogation-  Brief check only. BiV % 76-80%.   Arrhythmia/Device History Medtronic BiV ICD implanted 04/2017 for syncope and new LBBB following TAVR Gen change 05/2023   Physical Exam:   VS:  BP (!) 87/56   Pulse (!) 104   Ht 5' 6 (1.676 m)   Wt 263 lb (119.3 kg)   SpO2 96%   BMI 42.45 kg/m    Wt Readings from Last 3 Encounters:  07/16/24 263 lb (119.3 kg)  07/15/24 269 lb 6.4 oz (122.2 kg)  05/26/24 264 lb 3.2 oz (119.8 kg)     GEN: No acute distress  NECK: No JVD; No carotid bruits CARDIAC: Irregularly irregular rate and rhythm, no murmurs, rubs, gallops RESPIRATORY:  Clear to auscultation without rales, wheezing or rhonchi  ABDOMEN: Soft, non-tender, non-distended EXTREMITIES:  1-2+ edema; No deformity   ASSESSMENT AND PLAN:    Acute on Chronic systolic CHF  s/p Medtronic CRT-D  Volume status elevated, diuretics adjusted yesterday by HF team.  Stable on an appropriate medical regimen Formal check not completed. Fluid remains elevated from 11/17 remote. No therapies.  No  changes today  Permanent AF Secondary hypercoagulable state High risk medication monitoring - Amiodarone  Continue amiodarone  200 mg daily Interferes with BiV pacing%.    V pacing better at 76-80%.  Continue toprol  100 mg daily. Will not increase for now with A/C CHF and adjustments to diuretics.  Will tentatively need AV nodal ablation if continued BB titration fails to improve BIV%.   PVCs Further confounds BIV%.  AKI Hope that this will improve with diuresis.  Encouraged assessment in ER if pt develops sudden decrease in urine output, confusion, worsening SOB, or chest pain.   Disposition:   Follow up with Dr. Kitty in 3 months to further assess BiV% and plans.    Signed, Ozell Prentice Passey, PA-C

## 2024-07-15 NOTE — Progress Notes (Signed)
 ADVANCED HEART FAILURE CLINIC NOTE  Primary Care: Mast, Man X, NP Primary Cardiologist: Dr. Verlin EP: Dr. Fernande HF Cardiologist: Dr. Gardenia  Reason for visit: F/u for heart failure   HPI: Justin [name] is a 89 y.o. male with permanent atrial fibrillation, aortic stenosis s/p TAVR, medtronic BiV ICD in 2018 after TAVR, CKD, CAD s/p CABG in 2007.  Has followed with Dr. Verlin and Dr. Fernande. S/p ICD generator changed 10/24. EF 40-45% at the time. Enrolled in iCM monitoring.  Admitted 3/25 for pneumonia and compressive atelectasis 2/2 COVID and flu infection c/b UTI. S/p thoracentesis 1L. EF 40-45% with mod reduced RV, normal AoV prosthetic. He was discharged to SNF.   Interval hx:  Today he returns for HF follow up and for fluid check. He is followed in [facility] clinic and recent interrogation showed elevated optivol, suggesting ongoing fluid accumulation over the last several wks. He was instructed to increase metolazone  usage from once weekly to twice a wk.   Here w/ daughter. C/w significant volume overload. Wt up 20 lb from dry wt. He reports full med compliance and good UOP w/ torsemide . Robust UOP on metolazone  days. He admits to dietary indiscretion. He resides at a ALF and has minimal control over their food offerings. He does however drink a lot of fluids throughout the day. Also eating a lot of soup. Breathing has worsened, NYHA Class III. + orthopnea.   We attempted device interrogation but unable to obtain (transmitter not working).     Current Outpatient Medications  Medication Sig Dispense Refill   albuterol  (VENTOLIN  HFA) 108 (90 Base) MCG/ACT inhaler Inhale 2 puffs into the lungs every 4 (four) hours as needed for wheezing or shortness of breath. 8 g 5   allopurinol  (ZYLOPRIM ) 100 MG tablet Take 1 tablet (100 mg total) by mouth daily. 90 tablet 3   amiodarone  (PACERONE ) 200 MG tablet Take 1 tablet (200 mg total) by mouth daily. 90 tablet 3   aspirin  81 MG tablet  Take 1 tablet (81 mg total) by mouth daily. 30 tablet 0   atorvastatin  (LIPITOR) 20 MG tablet TAKE ONE TABLET ONCE DAILY 90 tablet 2   calcium  carbonate (OS-CAL) 1250 (500 Ca) MG chewable tablet Chew 1 tablet by mouth daily.     Calcium -Magnesium -Zinc-Vit D3 (CALCIUM -MAGNESIUM -ZINC-D3 PO) Take 2 tablets by mouth daily.     fluticasone  (FLONASE ) 50 MCG/ACT nasal spray Place 2 sprays into both nostrils daily.     isosorbide  mononitrate (IMDUR ) 30 MG 24 hr tablet Take 1 tablet (30 mg total) by mouth daily. 90 tablet 3   levothyroxine  (SYNTHROID ) 175 MCG tablet Take 175 mcg by mouth daily.     metoCLOPramide  (REGLAN ) 10 MG tablet Take 10 mg by mouth 2 (two) times daily.     metolazone  (ZAROXOLYN ) 5 MG tablet Take 1 tablet (5 mg total) every Sunday and Wednesday with extra 40 of Potassium with each metolazone  doses. 24 tablet 1   metoprolol  succinate (TOPROL -XL) 100 MG 24 hr tablet Take 100 mg by mouth daily. Take with or immediately following a meal.     montelukast  (SINGULAIR ) 10 MG tablet TAKE ONE TABLET BY MOUTH ONCE DAILY IN THE EVENING 90 tablet 3   Multiple Vitamins-Minerals (MULTI-VITAMIN GUMMIES) CHEW Chew 2 Pieces by mouth daily.     Multiple Vitamins-Minerals (PRESERVISION AREDS 2) CAPS Take 1 capsule by mouth in the morning and at bedtime.     oxycodone  (OXY-IR) 5 MG capsule Take 5 mg by mouth every  8 (eight) hours as needed.     OXYGEN Inhale 2 L into the lungs as directed. Resident to wear O2 when ambulating around the unit and during therapy every shift     pantoprazole  (PROTONIX ) 40 MG tablet Take 40 mg by mouth 2 (two) times daily.     polyethylene glycol powder (GLYCOLAX/MIRALAX) 17 GM/SCOOP powder Take 17 g by mouth daily as needed for moderate constipation.     Potassium Chloride  ER 20 MEQ TBCR Take 4 tablets (80 mEq total) by mouth 2 (two) times daily. Take additional 40 mEq with each Metolazone  dose. 556 tablet 2   Probiotic Product (PROBIOTIC PO) Take 1 capsule by mouth daily.      sacubitril-valsartan (ENTRESTO ) 24-26 MG Take 0.5 tablets by mouth 2 (two) times daily.     spironolactone  (ALDACTONE ) 25 MG tablet Take 1 tablet (25 mg total) by mouth daily. 30 tablet 9   tamsulosin (FLOMAX) 0.4 MG CAPS capsule Take 0.4 mg by mouth daily.     triamcinolone  cream (KENALOG ) 0.1 % Apply 1 application  topically daily as needed for dry skin (affected areas).     valACYclovir  (VALTREX ) 500 MG tablet Take 500 mg by mouth every 12 (twelve) hours as needed (cold sores).     venlafaxine  XR (EFFEXOR  XR) 37.5 MG 24 hr capsule Take 1 capsule (37.5 mg total) by mouth daily with breakfast. 30 capsule 1   Baclofen 5 MG TABS Take 5 mg by mouth every 12 (twelve) hours as needed.     cetirizine (ZYRTEC) 10 MG tablet Take 10 mg by mouth 2 (two) times daily. As needed (Patient not taking: Reported on 07/15/2024)     colchicine  0.6 MG tablet Take 1 tablet (0.6 mg total) by mouth every 6 (six) hours as needed (gout). (Patient not taking: Reported on 07/15/2024) 60 tablet 5   dabigatran  (PRADAXA ) 150 MG CAPS capsule TAKE (1) CAPSULE TWICE DAILY. 180 capsule 2   diclofenac  Sodium (VOLTAREN ) 1 % GEL Apply 4 g topically 3 (three) times daily as needed (pain). 50 g 0   docusate sodium  (COLACE) 100 MG capsule Take 100 mg by mouth 2 (two) times daily.     insulin  aspart (NOVOLOG ) 100 UNIT/ML injection Inject 4-16 Units into the skin as directed. Inject as per sliding scale: if 141 - 180 = 4 units Inject per sliding scale; 181 - 220 = 6 units Inject per sliding scale; 221 - 260 = 8 units Inject per sliding scale; 261 - 300 = 10 units Inject per sliding scale; 301 - 350 = 12 units Inject per sliding scale; 351 - 400= 14 units Inject per sliding scale; 401+ = 16 units Inject per sliding scale, subcutaneously three times a day     ketotifen  (ZADITOR ) 0.025 % ophthalmic solution Place 1 drop into both eyes daily as needed (for itching).     Tiotropium Bromide Monohydrate  (SPIRIVA  RESPIMAT) 2.5 MCG/ACT AERS Inhale  2 puffs into the lungs daily.     torsemide  (DEMADEX ) 20 MG tablet Take 4 tablets (80 mg total) by mouth in the morning AND 4 tablets (80 mg total) every evening.     Current Facility-Administered Medications  Medication Dose Route Frequency Provider Last Rate Last Admin   azithromycin  (ZITHROMAX ) tablet 500 mg  500 mg Oral Daily        Wt Readings from Last 3 Encounters:  07/15/24 122.2 kg (269 lb 6.4 oz)  05/26/24 119.8 kg (264 lb 3.2 oz)  05/17/24 115.6 kg (254 lb  12.8 oz)   BP 92/62   Pulse 99   Ht 5' 6 (1.676 m)   Wt 122.2 kg (269 lb 6.4 oz)   SpO2 97%   BMI 43.48 kg/m   PHYSICAL EXAM: GENERAL: obese, elderly male NAD Lungs- diminished at bases  CARDIAC:  JVP 10 cm          Irregularly irregular rhythm and rate. No MRG, 1+ b/l LEE  ABDOMEN: obese and distended. NT  EXTREMITIES: Warm and well perfused.  NEUROLOGIC: No obvious FND   Device interrogation attempted interrogation but unable to obtain (transmitter not working), remove interrogation from 11/17 demonstrated decreased impedence/ elevated fluid index suggesting ongoing fluid overload since early October   DATA REVIEW  ECHO: 11/12/22: LVEF 45-50%, RV function low normal.  11/18/23: LVEF 40-45%, RV mod reduced, BAE  CATH: LHC/RHC: 08/2016:  There is moderate aortic valve stenosis. SVG graft was visualized by angiography and is normal in caliber. 1st Mrg lesion, 65 %stenosed. Ost LM to LM lesion, 100 %stenosed. SVG graft was visualized by angiography and is normal in caliber. LIMA graft was visualized by angiography and is normal in caliber. Prox RCA to Mid RCA lesion, 100 %stenosed. SVG graft was visualized by angiography and is normal in caliber and anatomically normal. Hemodynamic findings consistent with mild pulmonary hypertension.   1. 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  2. The Left main is occluded at the ostium.  3. The LAD  fills from the patent IMA graft and the Diagonal fills from the patent vein graft 4. The Circumflex fills from the patent vein graft to the OM. There is a moderate stenosis in the proximal segment of the OM branch proximal to the insertion of the vein graft.  5. The distal RCA fills from the patent vein graft.  6. Moderate AS 7. 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  NYHA class / AHA Stage: III, worsened over recent wks w/ volume overload  Volume status & Diuretics:  volume overloaded on exam and by recent device interrogation  - Increase Torsemide  from 80/40 to 80 mg bid  - Instructed to take extra metolazone , 2.5 mg, today and again on Saturday w/ Extra 40 mEq of KCl - continue w/ twice wkly metolazone  2.5 mg next wk and further on, Wed + Sundays + extra 40 mEq KCl - Continue KCL 80 bid on daily basis  Vasodilators: Continue Entresto  12/13 mg bid. BP too soft for up titration  Beta-Blocker: Continue Toprol  XL 100 mg daily. MRA: Continue spironolactone  25 mg daily. Cardiometabolic: Off SGLT2i with UTIs. Devices therapies & Valvulopathies: s/p CRT-D, scheduled to see EP tomorrow (they can interrogate device)  Advanced therapies: not a candidate - Obtain BMP and BNP today and will need f/u BMP in 7 wk w/ diuretic increase   2. Permanent Atrial fibrillation & frequent PVCs  - Follow with EP, enrolled in iCM. - Rate controlled.  - Continue amio 200 mg daily. - Continue Pradaxa  150 mg bid.  3. CAD s/p CABG  - CABG in 2007 - LHC in 2018. - denies CP  - Continue ASA. - Continue atorvastatin  20 mg daily.  4. Aortic stenosis  - s/p TAVR - CRT-D placed due to syncope and TAVR. . 5. Hypothyroidism - on synthroid   - Per PCP  6. Dyspnea - Likely multifactorial. Recently worsened in setting of volume overload (see plan above) - Continue  home oxygen - PFTs suggest interstitial process with mild diffusion defect; DLCO 60% pred. Will need to follow annually while on amiodarone    F/u w/ APP in 3-4 wks. Continue remote monitoring through ICM clinic.     Caffie Shed, PA-C 07/15/24

## 2024-07-16 ENCOUNTER — Ambulatory Visit: Attending: Student | Admitting: Student

## 2024-07-16 ENCOUNTER — Encounter: Payer: Self-pay | Admitting: Student

## 2024-07-16 VITALS — BP 87/56 | HR 104 | Ht 66.0 in | Wt 263.0 lb

## 2024-07-16 DIAGNOSIS — I493 Ventricular premature depolarization: Secondary | ICD-10-CM

## 2024-07-16 DIAGNOSIS — I4821 Permanent atrial fibrillation: Secondary | ICD-10-CM | POA: Diagnosis not present

## 2024-07-16 DIAGNOSIS — I5042 Chronic combined systolic (congestive) and diastolic (congestive) heart failure: Secondary | ICD-10-CM

## 2024-07-16 DIAGNOSIS — Z95810 Presence of automatic (implantable) cardiac defibrillator: Secondary | ICD-10-CM

## 2024-07-16 NOTE — Patient Instructions (Signed)
 Medication Instructions:  No medication changes today. *If you need a refill on your cardiac medications before your next appointment, please call your pharmacy*  Lab Work: No labwork ordered today. If you have labs (blood work) drawn today and your tests are completely normal, you will receive your results only by: MyChart Message (if you have MyChart) OR A paper copy in the mail If you have any lab test that is abnormal or we need to change your treatment, we will call you to review the results.  Testing/Procedures: No testing ordered today  Follow-Up: At [facility], you and your health needs are our priority.  As part of our continuing mission to provide you with exceptional heart care, our providers are all part of one team.  This team includes your primary Cardiologist (physician) and Advanced Practice Providers or APPs (Physician Assistants and Nurse Practitioners) who all work together to provide you with the care you need, when you need it.  Your next appointment:   3-4 months  Provider:   Dr. [name] specifically  We recommend signing up for the patient portal called MyChart.  Sign up information is provided on this After Visit Summary.  MyChart is used to connect with patients for Virtual Visits (Telemedicine).  Patients are able to view lab/test results, encounter notes, upcoming appointments, etc.  Non-urgent messages can be sent to your provider as well.   To learn more about what you can do with MyChart, go to [email or web address].

## 2024-07-19 ENCOUNTER — Ambulatory Visit: Attending: Cardiology

## 2024-07-19 DIAGNOSIS — Z95810 Presence of automatic (implantable) cardiac defibrillator: Secondary | ICD-10-CM

## 2024-07-19 DIAGNOSIS — I5042 Chronic combined systolic (congestive) and diastolic (congestive) heart failure: Secondary | ICD-10-CM

## 2024-07-19 NOTE — Progress Notes (Signed)
 EPIC Encounter for ICM Monitoring  Patient Name: Justin [name] is a 89 y.o. male Date: 07/19/2024 Primary Care Physican: Mast, Man X, NP Primary Cardiologist: [place]/HF clinic Electrophysiologist:  [name] Pore Pacing: 77.1%  09/27/2022 Weight: 270 lbs 02/12/2023 Weight: 261 lbs 07/07/2023 Office Weight: 261 lbs 10/07/2023 Weight: 245-250 lbs 10/24/2023 Office Weight: 252 lbs 03/01/2024 Office Weight: 252 lbs 03/17/2024 Office Weight: 253 lbs 05/25/2024 Weight: 259 lbs 05/27/2024 Weight: 257 lbs after taking 05/26/2024 Metolazone  07/15/2024 Office Weight: 269.6 lbs 07/16/2024 Office Weight: 263 lbs   Since 16-Jul-2024 Time in AT/AF  24.0 hr/day (100.0%)   Spoke with daughter Justin [name] and heart failure questions reviewed.  Transmission results reviewed.  She reports his breathing has improved since office visit.   Diet: Restricting salt intake   Since 06/07/2024 ICM Remote Transmission: Optivol thoracic impedance suggesting fluid levels returned to baseline.    Prescribed: Torsemide  20 mg take 4 tablets (80 mg total) by mouth twice a day (increased 07/15/2024)    Potassium 20 mEq take 4 tablet(s) (80 mEq total) by mouth twice a day. Additional 40 mEq with each Metolazone  dose.     Metolazone  5 mg Take 1 tablet by mouth twice a week every Sunday & Wednesday with 40 mEq of Potassium with each Metolazone  dose (starting 07/07/2024) .   Spironolactone  25 mg take 1 tablet daily   Labs: 07/20/2024 BMET Scheduled at HF clinic 07/15/2024 Creatinine 2.27, BUN 52, Potassium 4.3, Sodium 132, GFR 27 06/03/2024 Creatinine 1.78, BUN 46, Potassium 5.1, Sodium 136, GFR 36 04/14/2024 Creatinine 1.50, BUN 33, Potassium 4.4, Sodium 133, GFR 45, BNP 489.4 03/25/2024 Creatinine 1.45, BUN 31, Potassium 4.0, Sodium 132, GFR 47 03/09/2024 Creatinine 1.43, BUN 32, Potassium 3.5, Sodium 131, GFR 47 A complete set of results can be found in Results Review.   Recommendations:  No changes and encouraged  to call if pt is experiencing any fluid symptoms.   Follow-up plan: ICM clinic phone appointment on 08/23/2024.  91 day device clinic remote transmission 08/31/2024.     EP/Cardiology Office Visits:   08/03/2024 with [name].   08/13/2024 with HF clinic.   Copy of ICM check sent to Dr [name].    Remote monitoring is medically necessary for Heart Failure Management.    Daily Thoracic Impedance ICM trend: 04/19/2024 through 07/19/2024.    12-14 Month Thoracic Impedance ICM trend:     [name], RN 07/19/2024 2:53 PM

## 2024-07-20 ENCOUNTER — Ambulatory Visit (HOSPITAL_COMMUNITY)

## 2024-07-21 ENCOUNTER — Ambulatory Visit (HOSPITAL_COMMUNITY)
Admission: RE | Admit: 2024-07-21 | Discharge: 2024-07-21 | Disposition: A | Discharge status: Home / Self Care | Source: Ambulatory Visit | Attending: Cardiology | Admitting: Cardiology

## 2024-07-21 DIAGNOSIS — I5022 Chronic systolic (congestive) heart failure: Secondary | ICD-10-CM | POA: Diagnosis present

## 2024-07-21 LAB — BASIC METABOLIC PANEL WITH GFR
Anion gap: 11 (ref 5–15)
BUN: 49 mg/dL — ABNORMAL HIGH (ref 8–23)
CO2: 26 mmol/L (ref 22–32)
Calcium: 8.3 mg/dL — ABNORMAL LOW (ref 8.9–10.3)
Chloride: 97 mmol/L — ABNORMAL LOW (ref 98–111)
Creatinine, Ser: 2.33 mg/dL — ABNORMAL HIGH (ref 0.61–1.24)
GFR, Estimated: 26 mL/min — ABNORMAL LOW (ref >60)
Glucose, Bld: 111 mg/dL — ABNORMAL HIGH (ref 70–99)
Potassium: 4.6 mmol/L (ref 3.5–5.1)
Sodium: 134 mmol/L — ABNORMAL LOW (ref 135–145)

## 2024-07-21 LAB — BRAIN NATRIURETIC PEPTIDE: B Natriuretic Peptide: 716.3 pg/mL — ABNORMAL HIGH (ref 0.0–100.0)

## 2024-07-29 ENCOUNTER — Ambulatory Visit (HOSPITAL_COMMUNITY): Payer: Self-pay | Admitting: Cardiology

## 2024-07-30 ENCOUNTER — Non-Acute Institutional Stay: Payer: Self-pay | Admitting: Nurse Practitioner

## 2024-07-30 ENCOUNTER — Encounter: Payer: Self-pay | Admitting: Nurse Practitioner

## 2024-07-30 DIAGNOSIS — E039 Hypothyroidism, unspecified: Secondary | ICD-10-CM

## 2024-07-30 DIAGNOSIS — F339 Major depressive disorder, recurrent, unspecified: Secondary | ICD-10-CM

## 2024-07-30 DIAGNOSIS — E785 Hyperlipidemia, unspecified: Secondary | ICD-10-CM

## 2024-07-30 DIAGNOSIS — I480 Paroxysmal atrial fibrillation: Secondary | ICD-10-CM

## 2024-07-30 DIAGNOSIS — I10 Essential (primary) hypertension: Secondary | ICD-10-CM | POA: Diagnosis not present

## 2024-07-30 DIAGNOSIS — I509 Heart failure, unspecified: Secondary | ICD-10-CM | POA: Diagnosis not present

## 2024-07-30 DIAGNOSIS — J449 Chronic obstructive pulmonary disease, unspecified: Secondary | ICD-10-CM

## 2024-07-30 DIAGNOSIS — K219 Gastro-esophageal reflux disease without esophagitis: Secondary | ICD-10-CM | POA: Diagnosis not present

## 2024-07-30 DIAGNOSIS — Z951 Presence of aortocoronary bypass graft: Secondary | ICD-10-CM | POA: Diagnosis not present

## 2024-07-30 DIAGNOSIS — Z794 Long term (current) use of insulin: Secondary | ICD-10-CM

## 2024-07-30 DIAGNOSIS — E1169 Type 2 diabetes mellitus with other specified complication: Secondary | ICD-10-CM | POA: Diagnosis not present

## 2024-07-30 DIAGNOSIS — N1831 Chronic kidney disease, stage 3a: Secondary | ICD-10-CM | POA: Diagnosis not present

## 2024-07-30 DIAGNOSIS — E871 Hypo-osmolality and hyponatremia: Secondary | ICD-10-CM

## 2024-07-30 DIAGNOSIS — M1A0490 Idiopathic chronic gout, unspecified hand, without tophus (tophi): Secondary | ICD-10-CM

## 2024-07-30 DIAGNOSIS — L853 Xerosis cutis: Secondary | ICD-10-CM | POA: Insufficient documentation

## 2024-07-30 DIAGNOSIS — M150 Primary generalized (osteo)arthritis: Secondary | ICD-10-CM

## 2024-07-30 NOTE — Assessment & Plan Note (Signed)
 Mild, baseline sodium is 130s

## 2024-07-30 NOTE — Assessment & Plan Note (Signed)
 Heart rate is controlled  s/p pace maker, defibrillator , on Amiodarone , Pradaxa , Metoprolol , followed by cardiology.

## 2024-07-30 NOTE — Assessment & Plan Note (Signed)
 no flare up, R+L wrist, takes Allopurinol , colchicine ,  uric acid 7.6 10/10/22

## 2024-07-30 NOTE — Assessment & Plan Note (Signed)
 on Levothyroxine , TSH 2.24 03/16/24

## 2024-07-30 NOTE — Assessment & Plan Note (Signed)
 Hx of CABG, TAVR, no chest pain, taking Isosorbide 

## 2024-07-30 NOTE — Assessment & Plan Note (Signed)
 Stable 05/26/2024 pulmonology, COPD, CHF, chronic respiratory failure with hypoxia, OSA

## 2024-07-30 NOTE — Assessment & Plan Note (Signed)
 mild edema BLE, RLE>LLE, off Farxiga  due to recurrent UTI, on Entresto , Metoprolol , Metolazone , Spironolactone , Torsemide , BNP 824 01/16/24>>489.5 04/14/24 Followed by cardiologist

## 2024-07-30 NOTE — Progress Notes (Signed)
 Location:   [facility] Room Number: 913 Place of Service:  ALF (13) Provider: Larwance Lacy Taglieri NP  Donnisha Besecker X, NP  Patient Care Team: Manaal Mandala X, NP as PCP - General (Internal Medicine) [name], MD as PCP - Cardiology (Cardiology) [name] Chew, MD as PCP - Electrophysiology (Cardiology) Nieves Cough, MD as Consulting Physician (Urology) Liane Sharyne MATSU, [facility] (Inactive) as Pharmacist (Pharmacist) Leslee Reusing, MD as Consulting Physician (Ophthalmology)  Extended Emergency Contact Information Primary Emergency Contact: [facility] Address: [address]          [place], [place] [address] [place] Phone: [phone] Relation: Daughter Secondary Emergency Contact: Billie Risen Address: [address]          [place], [place] [address] [place] Home Phone: [phone] Mobile Phone: [phone] Relation: Daughter  Code Status:  DNR Goals of care: Advanced Directive information    02/16/2024   11:59 AM  Advanced Directives  Does Patient Have a Medical Advance Directive? Yes  Type of Advance Directive Living will;Healthcare Power of Attorney  Does patient want to make changes to medical advance directive? No - Patient declined  Copy of Healthcare Power of Attorney in Chart? Yes - validated most recent copy scanned in chart (See row information)     Chief Complaint  Patient presents with   Medical Management of Chronic Issues    HPI:  Pt is a 89 y.o. male seen today for medical management of chronic diseases.     OA, s/p left replacement, chronic left knee pain, followed by orthopedics, ambulates with walker, risk of falling. Prn Oxy available to him.  `            UTI, treated with Fosfomycin by Urology, 01/16/24 urology Fosfomycin 3gm po q48hr x 2 wks.               Hospitalized 11/17/23-11/27/23 for hypoxic respiratory failure COVID/PNA and acute HRmrEF, left pleural effusion-s/p  thoracentesis with 1000cc of bloody fluid removed exudative by LDH,  improved SOB, Orthopnea, and edema BLE, treated with Prednisone , bronchodilators, antibiotics, and diuretics. Underwent evaluation of pulmonology, cardiology.              12/15/23 CXR mild CHF, no gross focal lund consolidation, pleural effusion, pneumothorax             Leukocytosis, chronic, wbc 17.7, neutrophils 73% 01/20/24             Gout, no flare up, R+L wrist, takes Allopurinol , colchicine ,  uric acid 7.6 10/10/22             Afib, s/p pace maker, defibrillator , on Amiodarone , Pradaxa , Metoprolol , followed by cardiology.              HLD on ASA, Atorvastatin , LDL 55 10/14/23             CAD, Hx of CABG, TAVR, no chest pain, taking Isosorbide              CHF, mild edema BLE, RLE>LLE, off Farxiga  due to recurrent UTI, on Entresto , Metoprolol , Metolazone , Spironolactone , Torsemide , BNP 824 01/16/24>>489.5 04/14/24, followed by cardiologist             Hyponatremia, Na 133 04/14/24             CKD Bun/creat 33/1.5 04/14/24<<52/2.06 07/15/24<<49/2.33 07/21/24             Hypothyroidism, on Levothyroxine , TSH 2.24 03/16/24  HTN, blood pressure is controlled, on Entresto , Metoprolol , diuretics.              GERD, stable, on PPI, Hx of Reglan  use, Hgb 14 01/20/24             Depression, mood is better with Effexor , failed Zoloft               T2DM, Hgb A1c 6.8 10/14/23, on insulin   Urinary frequency, taking tamsulosin      Past Medical History:  Diagnosis Date   Age-related macular degeneration, dry, both eyes    Allergic    24/7; 365 days/year; I'm allergic to pollens, dust, all southern grasses/trees, mold, mildue, cats, dogs (01/07/2017)   Anal fissure    Asthma    sees Dr. Alaine    Benign prostatic hypertrophy    (sees Dr. Colan   CAD (coronary artery disease)    a. s/p CABG 2007. b. Cath 08/2016 - 4/4 patent grafts.   Carotid bruit    carotid u/s 10/10: 0.39% bilaterally   Chronic combined systolic and  diastolic CHF (congestive heart failure) (HCC)    Complication of anesthesia 1980s   w/anal cyst OR, he gave me a saddle block then put a narcotic in spinal cord; had a severe reaction to that (01/07/2017)   Congestive heart failure (CHF) [facility])    ED (erectile dysfunction)    Family history of adverse reaction to anesthesia    daughter wakes up during OR (01/07/2017)   GERD (gastroesophageal reflux disease)    Gout    HTN (hypertension)    Hx of colonic polyps    (sees Dr. Abran)   Hyperlipidemia    Hypothyroidism    Moderate to severe aortic stenosis    a. s/p TAVR 02/2017.   Myocardial infarction [facility]) ~ 2000   Obesity    Osteoarthritis    was in my knees, hands (01/07/2017 )   PAF (paroxysmal atrial fibrillation) (HCC)    a. documented post TAVR.   Precancerous skin lesion    (sees Dr. 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   29 mm Edwards Sapien 3 transcatheter heart valve placed via percutaneous right transfemoral approach   Past Surgical History:  Procedure Laterality Date   ANUS SURGERY     opened it back up cause it wouldn't heal; wound up w/a fissure (01/07/2017)   BIV ICD INSERTION CRT-D N/A 05/02/2017   Procedure: BIV ICD INSERTION CRT-D;  Surgeon: [name], MD;  Location: [facility] INVASIVE CV LAB;  Service: Cardiovascular;  Laterality: N/A;   CARDIAC CATHETERIZATION  10/29/2005   CARDIAC CATHETERIZATION N/A 08/27/2016   Procedure: Right/Left Heart Cath and Coronary/Graft Angiography;  Surgeon: Lonni JONETTA Cash, MD;  Location: [facility] INVASIVE CV LAB;  Service: Cardiovascular;  Laterality: N/A;   CARDIOVERSION N/A 04/24/2021   Procedure: CARDIOVERSION;  Surgeon: Okey Vina GAILS, MD;  Location: [facility] ENDOSCOPY;  Service: Cardiovascular;  Laterality: N/A;   CARDIOVERSION N/A 11/08/2021   Procedure: CARDIOVERSION;  Surgeon: Mona Vinie BROCKS, MD;  Location: [facility] ENDOSCOPY;  Service:  Cardiovascular;  Laterality: N/A;   CARDIOVERSION N/A 02/06/2022   Procedure: CARDIOVERSION;  Surgeon: Delford Maude BROCKS, MD;  Location: [facility] ENDOSCOPY;  Service: Cardiovascular;  Laterality: N/A;   CATARACT EXTRACTION W/ INTRAOCULAR LENS  IMPLANT, BILATERAL Bilateral    CATARACT EXTRACTION, BILATERAL  2012   COLONOSCOPY  06/30/2008   no repeats  needed    COLONOSCOPY     had 3 or 4 in the past    CORONARY ARTERY BYPASS GRAFT  2007   CABG X4   CYST EXCISION PERINEAL  1980s   HAMMER TOE SURGERY Bilateral    ICD GENERATOR CHANGEOUT N/A 05/30/2023   Procedure: ICD GENERATOR CHANGEOUT;  Surgeon: [name], MD;  Location: [facility] INVASIVE CV LAB;  Service: Cardiovascular;  Laterality: N/A;   JOINT REPLACEMENT     KNEE ARTHROPLASTY  07/30/2011   Procedure: COMPUTER ASSISTED TOTAL KNEE ARTHROPLASTY;  Surgeon: Cordella Glendia Hutchinson;  Location: [facility] OR;  Service: Orthopedics;  Laterality: Left;  left total knee arthroplasty   MASTECTOMY SUBCUTANEOUS Bilateral    MULTIPLE TOOTH EXTRACTIONS     ORIF FINGER / THUMB FRACTURE Right ~ 1980   repair of thumb injury   PROSTATE BIOPSY     REPLACEMENT TOTAL KNEE BILATERAL Bilateral 2012   TEE WITHOUT CARDIOVERSION N/A 03/04/2017   Procedure: TRANSESOPHAGEAL ECHOCARDIOGRAM (TEE);  Surgeon: [name], MD;  Location: [facility] OR;  Service: Open Heart Surgery;  Laterality: N/A;   TOTAL KNEE REVISION Right 05/18/2019   Procedure: RIGHT PATELLA REVISION/REMOVAL;  Surgeon: Hutchinson Cordella Glendia, MD;  Location: [facility] OR;  Service: Orthopedics;  Laterality: Right;   TRANSCATHETER AORTIC VALVE REPLACEMENT, TRANSFEMORAL N/A 03/04/2017   Procedure: TRANSCATHETER AORTIC VALVE REPLACEMENT, TRANSFEMORAL;  Surgeon: [name], MD;  Location: [facility] OR;  Service: Open Heart Surgery;  Laterality: N/A;    Allergies  Allergen Reactions   Peanut-Containing Drug Products Anaphylaxis   Sulfonamide Derivatives Anaphylaxis   Amlodipine  Swelling and Other (See Comments)     Swelling in ankles   Eliquis  [Apixaban ] Other (See Comments)    Back/hip pain   Lisinopril  Cough   Xarelto  [Rivaroxaban ] Other (See Comments)    Back/hip pain    Allergies as of 07/30/2024       Reactions   Peanut-containing Drug Products Anaphylaxis   Sulfonamide Derivatives Anaphylaxis   Amlodipine  Swelling, Other (See Comments)   Swelling in ankles   Eliquis  [apixaban ] Other (See Comments)   Back/hip pain   Lisinopril  Cough   Xarelto  [rivaroxaban ] Other (See Comments)   Back/hip pain        Medication List        Accurate as of July 30, 2024  3:06 PM. If you have any questions, ask your nurse or doctor.          albuterol  108 (90 Base) MCG/ACT inhaler Commonly known as: VENTOLIN  HFA Inhale 2 puffs into the lungs every 4 (four) hours as needed for wheezing or shortness of breath.   allopurinol  100 MG tablet Commonly known as: ZYLOPRIM  Take 1 tablet (100 mg total) by mouth daily.   amiodarone  200 MG tablet Commonly known as: PACERONE  Take 1 tablet (200 mg total) by mouth daily.   aspirin  81 MG tablet Take 1 tablet (81 mg total) by mouth daily.   atorvastatin  20 MG tablet Commonly known as: LIPITOR TAKE ONE TABLET ONCE DAILY   Baclofen 5 MG Tabs Take 5 mg by mouth every 12 (twelve) hours as needed.   calcium  carbonate 1250 (500 Ca) MG chewable tablet Commonly known as: OS-CAL Chew 1 tablet by mouth daily.   CALCIUM -MAGNESIUM -ZINC-D3 PO Take 2 tablets by mouth daily.   cetirizine 10 MG tablet Commonly known as: ZYRTEC Take 10 mg by mouth 2 (two) times daily. As needed   colchicine  0.6 MG tablet Take 1 tablet (0.6 mg total) by mouth every  6 (six) hours as needed (gout).   dabigatran  150 MG Caps capsule Commonly known as: Pradaxa  TAKE (1) CAPSULE TWICE DAILY.   diclofenac  Sodium 1 % Gel Commonly known as: Voltaren  Apply 4 g topically 3 (three) times daily as needed (pain).   docusate sodium  100 MG capsule Commonly known as: COLACE Take  100 mg by mouth 2 (two) times daily.   Entresto  24-26 MG Generic drug: sacubitril-valsartan Take 0.5 tablets by mouth 2 (two) times daily.   fluticasone  50 MCG/ACT nasal spray Commonly known as: FLONASE  Place 2 sprays into both nostrils daily.   insulin  aspart 100 UNIT/ML injection Commonly known as: novoLOG  Inject 4-16 Units into the skin as directed. Inject as per sliding scale: if 141 - 180 = 4 units Inject per sliding scale; 181 - 220 = 6 units Inject per sliding scale; 221 - 260 = 8 units Inject per sliding scale; 261 - 300 = 10 units Inject per sliding scale; 301 - 350 = 12 units Inject per sliding scale; 351 - 400= 14 units Inject per sliding scale; 401+ = 16 units Inject per sliding scale, subcutaneously three times a day   isosorbide  mononitrate 30 MG 24 hr tablet Commonly known as: IMDUR  Take 1 tablet (30 mg total) by mouth daily.   ketotifen  0.025 % ophthalmic solution Commonly known as: ZADITOR  Place 1 drop into both eyes daily as needed (for itching).   levothyroxine  175 MCG tablet Commonly known as: SYNTHROID  Take 175 mcg by mouth daily.   metoCLOPramide  10 MG tablet Commonly known as: REGLAN  Take 10 mg by mouth 2 (two) times daily.   metolazone  5 MG tablet Commonly known as: ZAROXOLYN  Take 1 tablet (5 mg total) every Sunday and Wednesday with extra 40 of Potassium with each metolazone  doses.   metoprolol  succinate 100 MG 24 hr tablet Commonly known as: TOPROL -XL Take 100 mg by mouth daily. Take with or immediately following a meal.   montelukast  10 MG tablet Commonly known as: SINGULAIR  TAKE ONE TABLET BY MOUTH ONCE DAILY IN THE EVENING   Multi-Vitamin Gummies Chew Chew 2 Pieces by mouth daily.   PreserVision AREDS 2 Caps Take 1 capsule by mouth in the morning and at bedtime.   oxycodone  5 MG capsule Commonly known as: OXY-IR Take 5 mg by mouth every 8 (eight) hours as needed.   OXYGEN Inhale 2 L into the lungs as directed. Resident to wear O2 when  ambulating around the unit and during therapy every shift   pantoprazole  40 MG tablet Commonly known as: PROTONIX  Take 40 mg by mouth 2 (two) times daily.   polyethylene glycol powder 17 GM/SCOOP powder Commonly known as: GLYCOLAX/MIRALAX Take 17 g by mouth daily as needed for moderate constipation.   Potassium Chloride  ER 20 MEQ Tbcr Take 4 tablets (80 mEq total) by mouth 2 (two) times daily. Take additional 40 mEq with each Metolazone  dose.   PROBIOTIC PO Take 1 capsule by mouth daily.   Spiriva  Respimat 2.5 MCG/ACT Aers Generic drug: Tiotropium Bromide Inhale 2 puffs into the lungs daily.   spironolactone  25 MG tablet Commonly known as: ALDACTONE  Take 1 tablet (25 mg total) by mouth daily.   tamsulosin 0.4 MG Caps capsule Commonly known as: FLOMAX Take 0.4 mg by mouth daily.   torsemide  20 MG tablet Commonly known as: DEMADEX  Take 4 tablets (80 mg total) by mouth in the morning AND 4 tablets (80 mg total) every evening.   triamcinolone  cream 0.5 % Commonly known as: KENALOG  Apply  topically daily as needed for dry skin (affected areas).   valACYclovir  500 MG tablet Commonly known as: VALTREX  Take 500 mg by mouth every 12 (twelve) hours as needed (cold sores).   venlafaxine  XR 37.5 MG 24 hr capsule Commonly known as: Effexor  XR Take 1 capsule (37.5 mg total) by mouth daily with breakfast.        Review of Systems  Constitutional:  Negative for appetite change, fatigue and fever.  HENT:  Negative for congestion and trouble swallowing.   Eyes:  Negative for visual disturbance.  Respiratory:  Negative for cough, shortness of breath and wheezing.   Cardiovascular:  Positive for leg swelling. Negative for chest pain and palpitations.  Gastrointestinal:  Negative for abdominal pain and constipation.  Genitourinary:  Positive for frequency. Negative for dysuria and urgency.       Nocturnal urination about every 2-3 hours.   Musculoskeletal:  Positive for arthralgias  and gait problem.       S/p L knee replacement, feels weak and clicks, left knee pain is chronic  Skin:  Negative for color change.  Neurological:  Negative for dizziness, speech difficulty and weakness.  Psychiatric/Behavioral:  Negative for confusion, dysphoric mood and sleep disturbance. The patient is not nervous/anxious.     Immunization History  Administered Date(s) Administered   Fluad Quad(high Dose 65+) 05/06/2019, 05/30/2020, 06/18/2021, 05/27/2023   Hep A / Hep B 04/06/2012, 10/07/2012   Hepatitis B 05/07/2012   INFLUENZA, HIGH DOSE SEASONAL PF 05/15/2018, 05/31/2022   Influenza Split 06/28/2013   Influenza Whole 06/05/2007, 05/13/2008, 06/06/2009, 05/30/2010   Influenza,inj,Quad PF,6+ Mos 05/20/2016   Influenza-Unspecified 05/25/2014, 07/08/2015, 05/06/2017, 07/29/2023   Moderna Covid-19 Vaccine Bivalent Booster 32yrs & up 05/31/2022   PFIZER(Purple Top)SARS-COV-2 Vaccination 09/28/2019, 10/12/2019, 05/30/2020   PPD Test 03/31/2023   Pfizer Covid-19 Vaccine Bivalent Booster 42yrs & up 11/18/2021, 07/29/2023   Pneumococcal Conjugate-13 09/18/2015   Pneumococcal Polysaccharide-23 05/13/2008, 05/25/2014   Td 05/30/2010   Tdap 10/27/2020   Zoster Recombinant(Shingrix) 05/18/2018, 10/16/2018   Zoster, Live 10/21/2007   Pertinent  Health Maintenance Due  Topic Date Due   HEMOGLOBIN A1C  04/12/2024   Influenza Vaccine  11/23/2024 (Originally 03/26/2024)   OPHTHALMOLOGY EXAM  03/16/2025   FOOT EXAM  04/05/2025      03/31/2023   10:48 AM 10/09/2023    1:31 PM 11/13/2023    1:50 PM 12/31/2023    8:52 AM 02/10/2024    9:05 AM  Fall Risk  Falls in the past year? 0 0 0 0 0  Was there an injury with Fall? 0  0  0   0   Fall Risk Category Calculator 0 0 0  0  Patient at Risk for Falls Due to No Fall Risks History of fall(s) Impaired balance/gait;Impaired mobility    Fall risk Follow up Falls evaluation completed Falls evaluation completed Falls evaluation completed       Data  saved with a previous flowsheet row definition   Functional Status Survey:    Vitals:   07/30/24 1325  BP: 110/70  Pulse: 76  Resp: 20  Temp: (!) 97.5 F (36.4 C)  SpO2: 97%  Weight: 263 lb 3.2 oz (119.4 kg)   Body mass index is 42.48 kg/m. Physical Exam Vitals and nursing note reviewed.  Constitutional:      Appearance: He is obese.  HENT:     Head: Normocephalic and atraumatic.     Nose: Nose normal.  Eyes:     Extraocular Movements: Extraocular  movements intact.     Conjunctiva/sclera: Conjunctivae normal.     Pupils: Pupils are equal, round, and reactive to light.  Cardiovascular:     Rate and Rhythm: Normal rate. Rhythm irregular.     Heart sounds: No murmur heard.    Comments: Left upper chest pace maker, defibrillator  Pulmonary:     Effort: Pulmonary effort is normal.     Breath sounds: Rales present.     Comments: Right base rales.  Abdominal:     General: Bowel sounds are normal.     Palpations: Abdomen is soft.     Tenderness: There is no abdominal tenderness. There is no right CVA tenderness, left CVA tenderness, guarding or rebound.  Musculoskeletal:        General: No tenderness.     Cervical back: Normal range of motion and neck supple.     Right lower leg: Edema present.     Left lower leg: Edema present.     Comments: trace edema BLE Noted baker's cyst R popliteal area.   Skin:    General: Skin is warm and dry.     Findings: Rash present.     Comments: Dry skin dermatitis, varies in location, redness in groin area sometimes, triamcinolone  worked  Neurological:     General: No focal deficit present.     Mental Status: He is alert and oriented to person, place, and time. Mental status is at baseline.     Motor: No weakness.     Gait: Gait abnormal.  Psychiatric:        Mood and Affect: Mood normal.        Behavior: Behavior normal.        Thought Content: Thought content normal.        Judgment: Judgment normal.     Comments: Stated he feels  difficulty adjusting to [facility]      Labs reviewed: Recent Labs    11/25/23 0728 11/26/23 0417 11/27/23 0410 12/16/23 0000 04/14/24 1637 07/15/24 1227 07/21/24 1615  NA 134* 134* 134*   < > 133* 132* 134*  K 4.0 3.8 3.8   < > 4.4 4.3 4.6  CL 98 97* 96*   < > 93* 91* 97*  CO2 27 27 29    < > 24 28 26   GLUCOSE 87 94 106*   < > 99 90 111*  BUN 41* 43* 46*   < > 33* 52* 49*  CREATININE 1.13 1.11 1.32*   < > 1.50* 2.27* 2.33*  CALCIUM  7.8* 7.8* 7.7*   < > 8.3* 8.3* 8.3*  MG 2.7* 2.6* 2.5*  --   --   --   --    < > = values in this interval not displayed.   Recent Labs    11/18/23 1918 11/19/23 1844 11/20/23 1317 12/16/23 0000 01/20/24 1702 03/16/24 0000  AST 76*  --  93* 33 66* 26  ALT 45*  --  70* 33 31 15  ALKPHOS 147*  --  151* 160* 130* 155*  BILITOT 0.8  --  0.8  --  0.7  --   PROT 6.7 7.3 6.5  --  6.9  --   ALBUMIN  2.1*  --  2.1* 2.4* 2.6* 3.0*   Recent Labs    11/26/23 0417 11/27/23 0410 12/16/23 0000 01/20/24 1702  WBC 19.1* 20.4* 9.7 17.7*  NEUTROABS 16.4* 16.9* 5,645.00 12.9*  HGB 13.4 13.3 12.8* 14.0  HCT 43.0 41.9 41 44.2  MCV 86.2 84.5  --  87.9  PLT 345 352 211 345   Lab Results  Component Value Date   TSH 2.24 03/16/2024   Lab Results  Component Value Date   HGBA1C 6.8 (H) 10/14/2023   Lab Results  Component Value Date   CHOL 108 10/14/2023   HDL 33 (L) 10/14/2023   LDLCALC 55 10/14/2023   TRIG 113 10/14/2023   CHOLHDL 3.3 10/14/2023    Significant Diagnostic Results in last 30 days:  No results found.  Assessment/Plan  Chronic kidney disease, stage 3a (HCC) Bun/creat 33/1.5 04/14/24<<52/2.06 07/15/24<<49/2.33 07/21/24 New baseline creatinine 2s  Hypothyroidism on Levothyroxine , TSH 2.24 03/16/24  Essential hypertension blood pressure is controlled, on Entresto , Metoprolol , diuretics.   Gastroesophageal reflux disease stable, on PPI, Hx of Reglan  use, Hgb 14 01/20/24  Recurrent depression  mood is better with Effexor ,  failed Zoloft   Type 2 diabetes mellitus (HCC) Hgb A1c 6.8 10/14/23, on insulin   Chronic congestive heart failure (HCC) mild edema BLE, RLE>LLE, off Farxiga  due to recurrent UTI, on Entresto , Metoprolol , Metolazone , Spironolactone , Torsemide , BNP 824 01/16/24>>489.5 04/14/24 Followed by cardiologist  Hyponatremia Mild, baseline sodium is 130s  Hx of CABG Hx of CABG, TAVR, no chest pain, taking Isosorbide   Hyperlipidemia on ASA, Atorvastatin , LDL 55 10/14/23  COPD (chronic obstructive pulmonary disease) (HCC) Stable 05/26/2024 pulmonology, COPD, CHF, chronic respiratory failure with hypoxia, OSA  Paroxysmal atrial fibrillation (HCC) Heart rate is controlled  s/p pace maker, defibrillator , on Amiodarone , Pradaxa , Metoprolol , followed by cardiology.   GOUT no flare up, R+L wrist, takes Allopurinol , colchicine ,  uric acid 7.6 10/10/22  Osteoarthritis  s/p left replacement, chronic left knee pain, followed by orthopedics, ambulates with walker, motorized wheelchair to go further, risk of falling.  Dry skin dermatitis Encourage moisturizer using Dry skin dermatitis, varies in location, redness in groin area sometimes, triamcinolone  worked We will have the patient self administer 0.5% triamcinolone  cream as needed to affected area once failed moisturizer, usually has a scratched injuries.   Family/ staff Communication: Plan of care reviewed with the patient and charge nurse  Labs/tests ordered: None

## 2024-07-30 NOTE — Assessment & Plan Note (Signed)
 Hgb A1c 6.8 10/14/23, on insulin 

## 2024-07-30 NOTE — Assessment & Plan Note (Signed)
 Bun/creat 33/1.5 04/14/24<<52/2.06 07/15/24<<49/2.33 07/21/24 New baseline creatinine 2s

## 2024-07-30 NOTE — Assessment & Plan Note (Signed)
 on ASA, Atorvastatin, LDL 55 10/14/23

## 2024-07-30 NOTE — Assessment & Plan Note (Signed)
 stable, on PPI, Hx of Reglan  use, Hgb 14 01/20/24

## 2024-07-30 NOTE — Assessment & Plan Note (Signed)
 Encourage moisturizer using Dry skin dermatitis, varies in location, redness in groin area sometimes, triamcinolone  worked We will have the patient self administer 0.5% triamcinolone  cream as needed to affected area once failed moisturizer, usually has a scratched injuries.

## 2024-07-30 NOTE — Assessment & Plan Note (Signed)
 mood is better with Effexor , failed Zoloft 

## 2024-07-30 NOTE — Assessment & Plan Note (Signed)
 blood pressure is controlled, on Entresto , Metoprolol , diuretics.

## 2024-07-30 NOTE — Assessment & Plan Note (Signed)
 s/p left replacement, chronic left knee pain, followed by orthopedics, ambulates with walker, motorized wheelchair to go further, risk of falling.

## 2024-08-02 ENCOUNTER — Encounter: Payer: Self-pay | Admitting: Nurse Practitioner

## 2024-08-02 DIAGNOSIS — G3184 Mild cognitive impairment, so stated: Secondary | ICD-10-CM | POA: Insufficient documentation

## 2024-08-02 NOTE — Progress Notes (Unsigned)
 No chief complaint on file.  History of Present Illness: 89 yo male with history of HTN, HLD, severe aortic stenosis s/p TAVR, persistent atrial fibrillation, chronic diastolic and systolic CHF, CAD s/p 4V CABG, syncope, junctional bradycardia, PVCs, non-sustained VT and LBBB s/p ICD implantation who is here today for cardiac follow up. He is known to have CAD s/p 4V CABG in March 2007 (LIMA to LAD, SVG to Diagonal, SVG to OM, SVG to RCA). Last cardiac cath in January 2018 with 4/4 patent bypass grafts. He had progression of his aortic stenosis with worsened dyspnea and acute on chronic CHF in the spring of 2018. Echo May 2018 with mildly reduced LV systolic function, LVEF=40-45%. His aortic stenosis was severe. He underwent TAVR on 03/04/17 with a 29 mm Edwards Sapien 3 valve from the right transfemoral approach.  He developed atrial fibrillation following his procedure and was placed on IV amiodarone  with conversion to sinus rhythm. Follow up visit in our office 03/19/17 and pt noted to have irregular rhythm with intraventricular conduction delay and was felt by our EP team to represent 2:1 conduction of competing fast and slow pathways. Cardiac monitor with sinus rhythm with periods of junctional rhythm, frequent PVCs, frequent PACs and runs of a wide complex tachycardia. ICD was implanted on 05/03/17. Echo September 2020 with LVEF=35-40%. Carotid artery dopplers March 2021 with mld bilateral carotid artery disease. Echo March 2024 LVEF=40-45%. AVR working well. He was seen in the Advanced Heart Failure clinic in April 2024. ICD generator change 05/30/23. He was admitted to [facility] with volume overload on 06/30/23. He was diuresed 4 liters of fluid with clinical improvement. Echo with LVEF=40-45%. He was admitted to [facility] in March 2025 with pneumonia. Echo March 2025 with LVEF=40-45%. Large pleural effusion and thoracentesis performed. He was seen in the Advanced Heart Failure clinic 07/15/24 and  was felt to be volume overloaded. His torsemide  was increased.   He is here today for follow up. The patient denies any chest pain, dyspnea, palpitations, lower extremity edema, orthopnea, PND, dizziness, near syncope or syncope.     Primary Care Physician: Mast, Man X, NP  Past Medical History:  Diagnosis Date   Age-related macular degeneration, dry, both eyes    Allergic    24/7; 365 days/year; I'm allergic to pollens, dust, all southern grasses/trees, mold, mildue, cats, dogs (01/07/2017)   Anal fissure    Asthma    sees Dr. Alaine    Benign prostatic hypertrophy    (sees Dr. Colan   CAD (coronary artery disease)    a. s/p CABG 2007. b. Cath 08/2016 - 4/4 patent grafts.   Carotid bruit    carotid u/s 10/10: 0.39% bilaterally   Chronic combined systolic and diastolic CHF (congestive heart failure) (HCC)    Complication of anesthesia 1980s   w/anal cyst OR, he gave me a saddle block then put a narcotic in spinal cord; had a severe reaction to that (01/07/2017)   Congestive heart failure (CHF) [facility])    ED (erectile dysfunction)    Family history of adverse reaction to anesthesia    daughter wakes up during OR (01/07/2017)   GERD (gastroesophageal reflux disease)    Gout    HTN (hypertension)    Hx of colonic polyps    (sees Dr. Abran)   Hyperlipidemia    Hypothyroidism    Moderate to severe aortic stenosis    a. s/p TAVR 02/2017.   Myocardial infarction (HCC) ~ 2000  Obesity    Osteoarthritis    was in my knees, hands (01/07/2017 )   PAF (paroxysmal atrial fibrillation) (HCC)    a. documented post TAVR.   Precancerous skin lesion    (sees Dr. Ivin)   Prostate cancer [facility]) dx'd ~ 2014   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   29 mm Edwards Sapien 3 transcatheter heart valve placed via percutaneous right transfemoral approach    Past Surgical History:  Procedure Laterality Date   ANUS SURGERY     opened it  back up cause it wouldn't heal; wound up w/a fissure (01/07/2017)   BIV ICD INSERTION CRT-D N/A 05/02/2017   Procedure: BIV ICD INSERTION CRT-D;  Surgeon: [name], MD;  Location: [facility] INVASIVE CV LAB;  Service: Cardiovascular;  Laterality: N/A;   CARDIAC CATHETERIZATION  10/29/2005   CARDIAC CATHETERIZATION N/A 08/27/2016   Procedure: Right/Left Heart Cath and Coronary/Graft Angiography;  Surgeon: Lonni JONETTA Cash, MD;  Location: [facility] INVASIVE CV LAB;  Service: Cardiovascular;  Laterality: N/A;   CARDIOVERSION N/A 04/24/2021   Procedure: CARDIOVERSION;  Surgeon: Okey Vina GAILS, MD;  Location: [facility] ENDOSCOPY;  Service: Cardiovascular;  Laterality: N/A;   CARDIOVERSION N/A 11/08/2021   Procedure: CARDIOVERSION;  Surgeon: Mona Vinie BROCKS, MD;  Location: [facility] ENDOSCOPY;  Service: Cardiovascular;  Laterality: N/A;   CARDIOVERSION N/A 02/06/2022   Procedure: CARDIOVERSION;  Surgeon: Delford Maude BROCKS, MD;  Location: [facility] ENDOSCOPY;  Service: Cardiovascular;  Laterality: N/A;   CATARACT EXTRACTION W/ INTRAOCULAR LENS  IMPLANT, BILATERAL Bilateral    CATARACT EXTRACTION, BILATERAL  2012   COLONOSCOPY  06/30/2008   no repeats needed    COLONOSCOPY     had 3 or 4 in the past    CORONARY ARTERY BYPASS GRAFT  2007   CABG X4   CYST EXCISION PERINEAL  1980s   HAMMER TOE SURGERY Bilateral    ICD GENERATOR CHANGEOUT N/A 05/30/2023   Procedure: ICD GENERATOR CHANGEOUT;  Surgeon: [name], MD;  Location: [facility] INVASIVE CV LAB;  Service: Cardiovascular;  Laterality: N/A;   JOINT REPLACEMENT     KNEE ARTHROPLASTY  07/30/2011   Procedure: COMPUTER ASSISTED TOTAL KNEE ARTHROPLASTY;  Surgeon: Cordella Glendia Hutchinson;  Location: [facility] OR;  Service: Orthopedics;  Laterality: Left;  left total knee arthroplasty   MASTECTOMY SUBCUTANEOUS Bilateral    MULTIPLE TOOTH EXTRACTIONS     ORIF FINGER / THUMB FRACTURE Right ~ 1980   repair of thumb injury   PROSTATE BIOPSY     REPLACEMENT TOTAL KNEE BILATERAL Bilateral 2012    TEE WITHOUT CARDIOVERSION N/A 03/04/2017   Procedure: TRANSESOPHAGEAL ECHOCARDIOGRAM (TEE);  Surgeon: Cash Lonni JONETTA, MD;  Location: [facility] OR;  Service: Open Heart Surgery;  Laterality: N/A;   TOTAL KNEE REVISION Right 05/18/2019   Procedure: RIGHT PATELLA REVISION/REMOVAL;  Surgeon: Hutchinson Cordella Glendia, MD;  Location: [facility] OR;  Service: Orthopedics;  Laterality: Right;   TRANSCATHETER AORTIC VALVE REPLACEMENT, TRANSFEMORAL N/A 03/04/2017   Procedure: TRANSCATHETER AORTIC VALVE REPLACEMENT, TRANSFEMORAL;  Surgeon: Cash Lonni JONETTA, MD;  Location: [facility] OR;  Service: Open Heart Surgery;  Laterality: N/A;    Current Outpatient Medications  Medication Sig Dispense Refill   albuterol  (VENTOLIN  HFA) 108 (90 Base) MCG/ACT inhaler Inhale 2 puffs into the lungs every 4 (four) hours as needed for wheezing or shortness of breath. 8 g 5   allopurinol  (ZYLOPRIM ) 100 MG tablet Take 1 tablet (100 mg total) by mouth daily. 90 tablet  3   amiodarone  (PACERONE ) 200 MG tablet Take 1 tablet (200 mg total) by mouth daily. 90 tablet 3   aspirin  81 MG tablet Take 1 tablet (81 mg total) by mouth daily. 30 tablet 0   atorvastatin  (LIPITOR) 20 MG tablet TAKE ONE TABLET ONCE DAILY 90 tablet 2   Baclofen 5 MG TABS Take 5 mg by mouth every 12 (twelve) hours as needed.     calcium  carbonate (OS-CAL) 1250 (500 Ca) MG chewable tablet Chew 1 tablet by mouth daily.     Calcium -Magnesium -Zinc-Vit D3 (CALCIUM -MAGNESIUM -ZINC-D3 PO) Take 2 tablets by mouth daily.     cetirizine (ZYRTEC) 10 MG tablet Take 10 mg by mouth 2 (two) times daily. As needed     colchicine  0.6 MG tablet Take 1 tablet (0.6 mg total) by mouth every 6 (six) hours as needed (gout). 60 tablet 5   dabigatran  (PRADAXA ) 150 MG CAPS capsule TAKE (1) CAPSULE TWICE DAILY. 180 capsule 2   diclofenac  Sodium (VOLTAREN ) 1 % GEL Apply 4 g topically 3 (three) times daily as needed (pain). 50 g 0   docusate sodium  (COLACE) 100 MG capsule Take 100 mg by mouth 2 (two)  times daily.     fluticasone  (FLONASE ) 50 MCG/ACT nasal spray Place 2 sprays into both nostrils daily.     insulin  aspart (NOVOLOG ) 100 UNIT/ML injection Inject 4-16 Units into the skin as directed. Inject as per sliding scale: if 141 - 180 = 4 units Inject per sliding scale; 181 - 220 = 6 units Inject per sliding scale; 221 - 260 = 8 units Inject per sliding scale; 261 - 300 = 10 units Inject per sliding scale; 301 - 350 = 12 units Inject per sliding scale; 351 - 400= 14 units Inject per sliding scale; 401+ = 16 units Inject per sliding scale, subcutaneously three times a day     isosorbide  mononitrate (IMDUR ) 30 MG 24 hr tablet Take 1 tablet (30 mg total) by mouth daily. 90 tablet 3   ketotifen  (ZADITOR ) 0.025 % ophthalmic solution Place 1 drop into both eyes daily as needed (for itching).     levothyroxine  (SYNTHROID ) 175 MCG tablet Take 175 mcg by mouth daily.     metoCLOPramide  (REGLAN ) 10 MG tablet Take 10 mg by mouth 2 (two) times daily.     metolazone  (ZAROXOLYN ) 5 MG tablet Take 1 tablet (5 mg total) every Sunday and Wednesday with extra 40 of Potassium with each metolazone  doses. 24 tablet 1   metoprolol  succinate (TOPROL -XL) 100 MG 24 hr tablet Take 100 mg by mouth daily. Take with or immediately following a meal.     montelukast  (SINGULAIR ) 10 MG tablet TAKE ONE TABLET BY MOUTH ONCE DAILY IN THE EVENING 90 tablet 3   Multiple Vitamins-Minerals (MULTI-VITAMIN GUMMIES) CHEW Chew 2 Pieces by mouth daily.     Multiple Vitamins-Minerals (PRESERVISION AREDS 2) CAPS Take 1 capsule by mouth in the morning and at bedtime.     oxycodone  (OXY-IR) 5 MG capsule Take 5 mg by mouth every 8 (eight) hours as needed.     OXYGEN Inhale 2 L into the lungs as directed. Resident to wear O2 when ambulating around the unit and during therapy every shift     pantoprazole  (PROTONIX ) 40 MG tablet Take 40 mg by mouth 2 (two) times daily.     polyethylene glycol powder (GLYCOLAX/MIRALAX) 17 GM/SCOOP powder Take 17 g  by mouth daily as needed for moderate constipation.     Potassium Chloride  ER 20  MEQ TBCR Take 4 tablets (80 mEq total) by mouth 2 (two) times daily. Take additional 40 mEq with each Metolazone  dose. 556 tablet 2   Probiotic Product (PROBIOTIC PO) Take 1 capsule by mouth daily.     sacubitril-valsartan (ENTRESTO ) 24-26 MG Take 0.5 tablets by mouth 2 (two) times daily.     spironolactone  (ALDACTONE ) 25 MG tablet Take 1 tablet (25 mg total) by mouth daily. 30 tablet 9   tamsulosin (FLOMAX) 0.4 MG CAPS capsule Take 0.4 mg by mouth daily.     Tiotropium Bromide Monohydrate  (SPIRIVA  RESPIMAT) 2.5 MCG/ACT AERS Inhale 2 puffs into the lungs daily.     torsemide  (DEMADEX ) 20 MG tablet Take 4 tablets (80 mg total) by mouth in the morning AND 4 tablets (80 mg total) every evening.     triamcinolone  cream (KENALOG ) 0.5 % Apply topically daily as needed for dry skin (affected areas).     valACYclovir  (VALTREX ) 500 MG tablet Take 500 mg by mouth every 12 (twelve) hours as needed (cold sores).     venlafaxine  XR (EFFEXOR  XR) 37.5 MG 24 hr capsule Take 1 capsule (37.5 mg total) by mouth daily with breakfast. 30 capsule 1   Current Facility-Administered Medications  Medication Dose Route Frequency Provider Last Rate Last Admin   azithromycin  (ZITHROMAX ) tablet 500 mg  500 mg Oral Daily         Allergies  Allergen Reactions   Peanut-Containing Drug Products Anaphylaxis   Sulfonamide Derivatives Anaphylaxis   Amlodipine  Swelling and Other (See Comments)    Swelling in ankles   Eliquis  [Apixaban ] Other (See Comments)    Back/hip pain   Lisinopril  Cough   Xarelto  [Rivaroxaban ] Other (See Comments)    Back/hip pain    Social History   Socioeconomic History   Marital status: Married    Spouse name: Not on file   Number of children: 3   Years of education: Not on file   Highest education level: Not on file  Occupational History   Occupation: product/process development scientist    Comment: builds malls  Tobacco Use    Smoking status: Former    Current packs/day: 0.00    Average packs/day: 3.5 packs/day for 13.0 years (45.5 ttl pk-yrs)    Types: Cigarettes    Start date: 52    Quit date: 1963    Years since quitting: 62.9   Smokeless tobacco: Never   Tobacco comments:    Former smoker 04/19/21  Vaping Use   Vaping status: Never Used  Substance and Sexual Activity   Alcohol use: Not Currently    Alcohol/week: 7.0 standard drinks of alcohol    Types: 7 Cans of beer per week    Comment: 1 beer daily after 5pm (04/19/21)   Drug use: No   Sexual activity: Never  Other Topics Concern   Not on file  Social History Narrative   FH of CAD, Male 1st degree relative less than age 108.   Social Drivers of Corporate Investment Banker Strain: Low Risk  (03/31/2023)   Overall Financial Resource Strain (CARDIA)    Difficulty of Paying Living Expenses: Not very hard  Food Insecurity: No Food Insecurity (11/18/2023)   Hunger Vital Sign    Worried About Running Out of Food in the Last Year: Never true    Ran Out of Food in the Last Year: Never true  Transportation Needs: No Transportation Needs (11/18/2023)   PRAPARE - Administrator, Civil Service (Medical): No  Lack of Transportation (Non-Medical): No  Physical Activity: Sufficiently Active (03/31/2023)   Exercise Vital Sign    Days of Exercise per Week: 7 days    Minutes of Exercise per Session: 30 min  Stress: No Stress Concern Present (03/31/2023)   [employer] of Occupational Health - Occupational Stress Questionnaire    Feeling of Stress : Not at all  Social Connections: Socially Integrated (11/18/2023)   Social Connection and Isolation Panel    Frequency of Communication with Friends and Family: Three times a week    Frequency of Social Gatherings with Friends and Family: Three times a week    Attends Religious Services: More than 4 times per year    Active Member of Clubs or Organizations: Yes    Attends Banker  Meetings: More than 4 times per year    Marital Status: Married  Catering Manager Violence: Not At Risk (11/17/2023)   Humiliation, Afraid, Rape, and Kick questionnaire    Fear of Current or Ex-Partner: No    Emotionally Abused: No    Physically Abused: No    Sexually Abused: No    Family History  Problem Relation Age of Onset   Heart attack Father 61   Allergic rhinitis Father    Asthma Father    Heart failure Mother 72   Uterine cancer Mother    Breast cancer Mother    Colon cancer Neg Hx    Esophageal cancer Neg Hx     Review of Systems:  As stated in the HPI and otherwise negative.   There were no vitals taken for this visit.  Physical Examination: General: Well developed, well nourished, NAD  HEENT: OP clear, mucus membranes moist  SKIN: warm, dry. No rashes. Neuro: No focal deficits  Musculoskeletal: Muscle strength 5/5 all ext  Psychiatric: Mood and affect normal  Neck: No JVD, no carotid bruits, no thyromegaly, no lymphadenopathy.  Lungs:Clear bilaterally, no wheezes, rhonci, crackles Cardiovascular: Regular rate and rhythm. No murmurs, gallops or rubs. Abdomen:Soft. Bowel sounds present. Non-tender.  Extremities: No lower extremity edema. Pulses are 2 + in the bilateral DP/PT.  EKG:  EKG is not *** ordered today The ekg ordered today demonstrates    Recent Labs: 11/27/2023: Magnesium  2.5 01/20/2024: Hemoglobin 14.0; Platelets 345 03/16/2024: ALT 15; TSH 2.24 07/21/2024: B Natriuretic Peptide 716.3; BUN 49; Creatinine, Ser 2.33; Potassium 4.6; Sodium 134   Lipid Panel    Component Value Date/Time   CHOL 108 10/14/2023 0729   CHOL 171 08/11/2018 0735   TRIG 113 10/14/2023 0729   TRIG 143 06/30/2006 1132   HDL 33 (L) 10/14/2023 0729   HDL 34 (L) 08/11/2018 0735   CHOLHDL 3.3 10/14/2023 0729   VLDL 19 07/03/2023 0426   LDLCALC 55 10/14/2023 0729     Wt Readings from Last 3 Encounters:  07/30/24 263 lb 3.2 oz (119.4 kg)  07/16/24 263 lb (119.3 kg)   07/15/24 269 lb 6.4 oz (122.2 kg)    Assessment and Plan:   1. CAD s/p CABG without angina: No chest pain. Continue ASA, statin, Imdur  and beta blocker.            2. Aortic valve stenosis: He is s/p TAVR in July 2018. AVR working well by echo in March 2025. No PVL. Continue ASA and SBE prophylaxis as indicated.     3. HTN: BP is well controlled. Continue current therapy  4. HLD: LDL 55 in February 2025. Continue statin  5. Chronic systolic and diastolic  CHF/Ischemic cardiomyopathy:  Wt is stable. He is followed in the Advanced Heart Failure clinic.  -Continue Torsemide , metolazone , Toprol , Entresto , aldactone .     6. Atrial fibrillation, persistent: Atrial fib today with good rate control. Continue amiodarone , Toprol  and Pradaxa .    7. Carotid artery disease:  Mild bilateral carotid artery disease by dopplers March 2021. Will not repeat dopplers given advanced age.   Labs/ tests ordered today include:   No orders of the defined types were placed in this encounter.  Disposition:  Follow up with me in 6 months.   Signed, [name], MD 08/02/2024 11:11 AM    [facility] [address] [place], [place], [place]  [address] Phone: [phone]; Fax: [phone]

## 2024-08-03 ENCOUNTER — Other Ambulatory Visit: Payer: Self-pay

## 2024-08-03 ENCOUNTER — Encounter: Payer: Self-pay | Admitting: Cardiovascular Disease

## 2024-08-03 ENCOUNTER — Ambulatory Visit: Attending: Cardiovascular Disease | Admitting: Cardiovascular Disease

## 2024-08-03 ENCOUNTER — Encounter (HOSPITAL_COMMUNITY): Payer: Self-pay

## 2024-08-03 ENCOUNTER — Emergency Department (HOSPITAL_COMMUNITY)

## 2024-08-03 ENCOUNTER — Inpatient Hospital Stay (HOSPITAL_COMMUNITY)
Admission: EM | Admit: 2024-08-03 | Discharge: 2024-08-05 | DRG: 312 | Source: Ambulatory Visit | Attending: Internal Medicine | Admitting: Internal Medicine

## 2024-08-03 VITALS — BP 64/38 | HR 95 | Ht 60.0 in | Wt 264.0 lb

## 2024-08-03 DIAGNOSIS — N401 Enlarged prostate with lower urinary tract symptoms: Secondary | ICD-10-CM | POA: Diagnosis present

## 2024-08-03 DIAGNOSIS — K219 Gastro-esophageal reflux disease without esophagitis: Secondary | ICD-10-CM | POA: Diagnosis not present

## 2024-08-03 DIAGNOSIS — I255 Ischemic cardiomyopathy: Secondary | ICD-10-CM

## 2024-08-03 DIAGNOSIS — E876 Hypokalemia: Secondary | ICD-10-CM | POA: Diagnosis not present

## 2024-08-03 DIAGNOSIS — I350 Nonrheumatic aortic (valve) stenosis: Secondary | ICD-10-CM

## 2024-08-03 DIAGNOSIS — M109 Gout, unspecified: Secondary | ICD-10-CM | POA: Diagnosis present

## 2024-08-03 DIAGNOSIS — J449 Chronic obstructive pulmonary disease, unspecified: Secondary | ICD-10-CM | POA: Diagnosis not present

## 2024-08-03 DIAGNOSIS — I2510 Atherosclerotic heart disease of native coronary artery without angina pectoris: Secondary | ICD-10-CM | POA: Diagnosis present

## 2024-08-03 DIAGNOSIS — G4733 Obstructive sleep apnea (adult) (pediatric): Secondary | ICD-10-CM | POA: Diagnosis present

## 2024-08-03 DIAGNOSIS — R531 Weakness: Secondary | ICD-10-CM | POA: Diagnosis present

## 2024-08-03 DIAGNOSIS — E7849 Other hyperlipidemia: Secondary | ICD-10-CM

## 2024-08-03 DIAGNOSIS — Z66 Do not resuscitate: Secondary | ICD-10-CM | POA: Diagnosis present

## 2024-08-03 DIAGNOSIS — Z95810 Presence of automatic (implantable) cardiac defibrillator: Secondary | ICD-10-CM | POA: Diagnosis not present

## 2024-08-03 DIAGNOSIS — I130 Hypertensive heart and chronic kidney disease with heart failure and stage 1 through stage 4 chronic kidney disease, or unspecified chronic kidney disease: Secondary | ICD-10-CM | POA: Diagnosis present

## 2024-08-03 DIAGNOSIS — Z953 Presence of xenogenic heart valve: Secondary | ICD-10-CM | POA: Diagnosis not present

## 2024-08-03 DIAGNOSIS — I10 Essential (primary) hypertension: Secondary | ICD-10-CM | POA: Diagnosis not present

## 2024-08-03 DIAGNOSIS — I5020 Unspecified systolic (congestive) heart failure: Secondary | ICD-10-CM | POA: Diagnosis not present

## 2024-08-03 DIAGNOSIS — Z952 Presence of prosthetic heart valve: Secondary | ICD-10-CM | POA: Diagnosis not present

## 2024-08-03 DIAGNOSIS — I959 Hypotension, unspecified: Principal | ICD-10-CM | POA: Insufficient documentation

## 2024-08-03 DIAGNOSIS — I428 Other cardiomyopathies: Secondary | ICD-10-CM | POA: Diagnosis present

## 2024-08-03 DIAGNOSIS — N179 Acute kidney failure, unspecified: Secondary | ICD-10-CM

## 2024-08-03 DIAGNOSIS — I4819 Other persistent atrial fibrillation: Secondary | ICD-10-CM | POA: Diagnosis not present

## 2024-08-03 DIAGNOSIS — C61 Malignant neoplasm of prostate: Secondary | ICD-10-CM | POA: Diagnosis present

## 2024-08-03 DIAGNOSIS — N1831 Chronic kidney disease, stage 3a: Secondary | ICD-10-CM | POA: Diagnosis present

## 2024-08-03 DIAGNOSIS — F339 Major depressive disorder, recurrent, unspecified: Secondary | ICD-10-CM | POA: Diagnosis present

## 2024-08-03 DIAGNOSIS — I5042 Chronic combined systolic (congestive) and diastolic (congestive) heart failure: Secondary | ICD-10-CM | POA: Diagnosis present

## 2024-08-03 DIAGNOSIS — E039 Hypothyroidism, unspecified: Secondary | ICD-10-CM | POA: Diagnosis present

## 2024-08-03 DIAGNOSIS — E1122 Type 2 diabetes mellitus with diabetic chronic kidney disease: Secondary | ICD-10-CM | POA: Diagnosis present

## 2024-08-03 DIAGNOSIS — I4821 Permanent atrial fibrillation: Secondary | ICD-10-CM | POA: Diagnosis present

## 2024-08-03 DIAGNOSIS — Z7901 Long term (current) use of anticoagulants: Secondary | ICD-10-CM | POA: Diagnosis not present

## 2024-08-03 DIAGNOSIS — I951 Orthostatic hypotension: Secondary | ICD-10-CM | POA: Insufficient documentation

## 2024-08-03 DIAGNOSIS — N1832 Chronic kidney disease, stage 3b: Secondary | ICD-10-CM | POA: Diagnosis present

## 2024-08-03 DIAGNOSIS — I952 Hypotension due to drugs: Secondary | ICD-10-CM | POA: Diagnosis present

## 2024-08-03 DIAGNOSIS — G3184 Mild cognitive impairment, so stated: Secondary | ICD-10-CM | POA: Diagnosis present

## 2024-08-03 DIAGNOSIS — E785 Hyperlipidemia, unspecified: Secondary | ICD-10-CM | POA: Diagnosis present

## 2024-08-03 DIAGNOSIS — D72829 Elevated white blood cell count, unspecified: Secondary | ICD-10-CM | POA: Diagnosis present

## 2024-08-03 DIAGNOSIS — N138 Other obstructive and reflux uropathy: Secondary | ICD-10-CM | POA: Diagnosis present

## 2024-08-03 DIAGNOSIS — Z951 Presence of aortocoronary bypass graft: Secondary | ICD-10-CM

## 2024-08-03 DIAGNOSIS — I5082 Biventricular heart failure: Secondary | ICD-10-CM

## 2024-08-03 DIAGNOSIS — Z794 Long term (current) use of insulin: Secondary | ICD-10-CM | POA: Diagnosis not present

## 2024-08-03 DIAGNOSIS — J4489 Other specified chronic obstructive pulmonary disease: Secondary | ICD-10-CM | POA: Diagnosis present

## 2024-08-03 DIAGNOSIS — H3530 Unspecified macular degeneration: Secondary | ICD-10-CM | POA: Diagnosis present

## 2024-08-03 LAB — COMPREHENSIVE METABOLIC PANEL WITH GFR
ALT: 15 U/L (ref 0–44)
AST: 26 U/L (ref 15–41)
Albumin: 2.3 g/dL — ABNORMAL LOW (ref 3.5–5.0)
Alkaline Phosphatase: 87 U/L (ref 38–126)
Anion gap: 11 (ref 5–15)
BUN: 52 mg/dL — ABNORMAL HIGH (ref 8–23)
CO2: 27 mmol/L (ref 22–32)
Calcium: 8.1 mg/dL — ABNORMAL LOW (ref 8.9–10.3)
Chloride: 96 mmol/L — ABNORMAL LOW (ref 98–111)
Creatinine, Ser: 2.56 mg/dL — ABNORMAL HIGH (ref 0.61–1.24)
GFR, Estimated: 23 mL/min — ABNORMAL LOW (ref >60)
Glucose, Bld: 100 mg/dL — ABNORMAL HIGH (ref 70–99)
Potassium: 4.1 mmol/L (ref 3.5–5.1)
Sodium: 134 mmol/L — ABNORMAL LOW (ref 135–145)
Total Bilirubin: 1 mg/dL (ref 0.0–1.2)
Total Protein: 6.3 g/dL — ABNORMAL LOW (ref 6.5–8.1)

## 2024-08-03 LAB — CBC WITH DIFFERENTIAL/PLATELET
Abs Immature Granulocytes: 0.04 K/uL (ref 0.00–0.07)
Basophils Absolute: 0.1 K/uL (ref 0.0–0.1)
Basophils Relative: 1 %
Eosinophils Absolute: 0.4 K/uL (ref 0.0–0.5)
Eosinophils Relative: 3 %
HCT: 35.7 % — ABNORMAL LOW (ref 39.0–52.0)
Hemoglobin: 11.6 g/dL — ABNORMAL LOW (ref 13.0–17.0)
Immature Granulocytes: 0 %
Lymphocytes Relative: 17 %
Lymphs Abs: 2 K/uL (ref 0.7–4.0)
MCH: 28.9 pg (ref 26.0–34.0)
MCHC: 32.5 g/dL (ref 30.0–36.0)
MCV: 88.8 fL (ref 80.0–100.0)
Monocytes Absolute: 1.4 K/uL — ABNORMAL HIGH (ref 0.1–1.0)
Monocytes Relative: 12 %
Neutro Abs: 7.9 K/uL — ABNORMAL HIGH (ref 1.7–7.7)
Neutrophils Relative %: 67 %
Platelets: 272 K/uL (ref 150–400)
RBC: 4.02 MIL/uL — ABNORMAL LOW (ref 4.22–5.81)
RDW: 16.6 % — ABNORMAL HIGH (ref 11.5–15.5)
WBC: 11.8 K/uL — ABNORMAL HIGH (ref 4.0–10.5)
nRBC: 0 % (ref 0.0–0.2)

## 2024-08-03 LAB — T4, FREE: Free T4: 1.32 ng/dL — ABNORMAL HIGH (ref 0.61–1.12)

## 2024-08-03 LAB — TROPONIN I (HIGH SENSITIVITY)
Troponin I (High Sensitivity): 16 ng/L (ref <18)
Troponin I (High Sensitivity): 15 ng/L (ref <18)

## 2024-08-03 LAB — TSH: TSH: 1.969 u[IU]/mL (ref 0.350–4.500)

## 2024-08-03 LAB — LACTIC ACID, PLASMA: Lactic Acid, Venous: 1.5 mmol/L (ref 0.5–1.9)

## 2024-08-03 LAB — BRAIN NATRIURETIC PEPTIDE: B Natriuretic Peptide: 674.3 pg/mL — ABNORMAL HIGH (ref 0.0–100.0)

## 2024-08-03 MED ORDER — SENNOSIDES-DOCUSATE SODIUM 8.6-50 MG PO TABS: 1.0000 | ORAL_TABLET | Freq: Every evening | ORAL | Status: DC | PRN

## 2024-08-03 MED ORDER — SODIUM CHLORIDE 0.9% FLUSH
3.0000 mL | Freq: Two times a day (BID) | INTRAVENOUS | Status: DC
  Administered 2024-08-03 – 2024-08-05 (×5): 3 mL via INTRAVENOUS

## 2024-08-03 MED ORDER — ACETAMINOPHEN 650 MG RE SUPP: 650.0000 mg | Freq: Four times a day (QID) | RECTAL | Status: DC | PRN

## 2024-08-03 MED ORDER — SODIUM CHLORIDE 0.9 % IV BOLUS: 500.0000 mL | Freq: Once | INTRAVENOUS | Status: DC

## 2024-08-03 MED ORDER — DABIGATRAN ETEXILATE MESYLATE 150 MG PO CAPS: 150.0000 mg | ORAL_CAPSULE | Freq: Two times a day (BID) | ORAL | Status: DC

## 2024-08-03 MED ORDER — LEVOTHYROXINE SODIUM 75 MCG PO TABS
175.0000 ug | ORAL_TABLET | Freq: Every day | ORAL | Status: DC
  Administered 2024-08-04 – 2024-08-05 (×2): 175 ug via ORAL
  Filled 2024-08-03 (×2): qty 1

## 2024-08-03 MED ORDER — SODIUM CHLORIDE 0.9 % IV SOLN: INTRAVENOUS | Status: DC

## 2024-08-03 MED ORDER — DABIGATRAN ETEXILATE MESYLATE 75 MG PO CAPS
75.0000 mg | ORAL_CAPSULE | Freq: Two times a day (BID) | ORAL | Status: DC
  Administered 2024-08-03 – 2024-08-05 (×3): 75 mg via ORAL
  Filled 2024-08-03 (×5): qty 1

## 2024-08-03 MED ORDER — SODIUM CHLORIDE 0.9 % IV BOLUS
500.0000 mL | Freq: Once | INTRAVENOUS | Status: AC
  Administered 2024-08-03: 500 mL via INTRAVENOUS

## 2024-08-03 MED ORDER — ACETAMINOPHEN 325 MG PO TABS: 650.0000 mg | ORAL_TABLET | Freq: Four times a day (QID) | ORAL | Status: DC | PRN

## 2024-08-03 MED ORDER — MONTELUKAST SODIUM 10 MG PO TABS
10.0000 mg | ORAL_TABLET | Freq: Every day | ORAL | Status: DC
  Administered 2024-08-03 – 2024-08-04 (×2): 10 mg via ORAL
  Filled 2024-08-03 (×2): qty 1

## 2024-08-03 MED ORDER — ASPIRIN 81 MG PO TBEC
81.0000 mg | DELAYED_RELEASE_TABLET | Freq: Every day | ORAL | Status: DC
  Administered 2024-08-04 – 2024-08-05 (×2): 81 mg via ORAL
  Filled 2024-08-03 (×2): qty 1

## 2024-08-03 MED ORDER — AMIODARONE HCL 200 MG PO TABS
200.0000 mg | ORAL_TABLET | Freq: Every day | ORAL | Status: DC
  Administered 2024-08-04 – 2024-08-05 (×2): 200 mg via ORAL
  Filled 2024-08-03 (×2): qty 1

## 2024-08-03 NOTE — ED Notes (Signed)
 Called CCMD

## 2024-08-03 NOTE — ED Notes (Addendum)
 Received following response from MD [facility] is consulting, can you please reach out to them. Let me know if you do not hear back from cardiology Notified MD that this RN had US  page cardio for me.

## 2024-08-03 NOTE — Consult Note (Signed)
 Advanced Heart Failure Team Consult Note   Primary Physician: Mast, Man X, NP Cardiologist:  [name], MD  Reason for Consultation: Hypotension /Heart Failure   HPI:    Justin [name] is seen today for evaluation of hypotension/heart failure at the request of Dr Elnor .   Justin [name] is a 89 year old with a history of permanent A fib, AS S/P TAVR, medtronic BiV ICD, CKD Stage IIIa, CAD, CABG 2007, and chronic HFrEF.   Followed in the HF clinic and was last seen on 07/15/24. Volume overloaded at the time. Optivol fluid index well above threshold.  Torsemide  increased to 80 mg twice a day and instructed to take extra dose of metolazone . There is some question that he may have been taking metolazone  everyday instead of twice a week. Since that time he reported increased urination. Over the last week he has been getting tired easily when walking. Uses a rollator or electric wheel chair. Lives at [facility].   Today he was seen by Dr Cash in the office.  Complaining of fatigue. Hypotension, BP 64/38.  Sent to ED via EMS. CXR with ? Mild vascular congestion. Worsening renal function. Creatinine up to 2.6, WBC 11.8, Hgb 11.6 and HS Trop 16. . Given 500 cc IV fluids in the ED. SBP now >100.   Optivol Interrogation - Impedance up. Fluid index is well below threshold. Suggesting he is not volume overloaded.    Home Medications Prior to Admission medications   Medication Sig Start Date End Date Taking? Authorizing Provider  albuterol  (VENTOLIN  HFA) 108 (90 Base) MCG/ACT inhaler Inhale 2 puffs into the lungs every 4 (four) hours as needed for wheezing or shortness of breath. 05/06/23   [name], MD  allopurinol  (ZYLOPRIM ) 100 MG tablet Take 1 tablet (100 mg total) by mouth daily. 09/18/23   [name], MD  amiodarone  (PACERONE ) 200 MG tablet Take 1 tablet (200 mg total) by mouth daily. 01/14/24   [name], MD  aspirin  81 MG tablet Take 1 tablet (81 mg total) by  mouth daily. 05/19/19   Magnant, Charles L, PA-C  atorvastatin  (LIPITOR) 20 MG tablet TAKE ONE TABLET ONCE DAILY 10/29/23   Cash [name], MD  Baclofen 5 MG TABS Take 5 mg by mouth every 12 (twelve) hours as needed (Muscle Spasms).    [provider]  calcium  carbonate (OS-CAL) 1250 (500 Ca) MG chewable tablet Chew 1 tablet by mouth daily.    [provider]  Calcium -Magnesium -Zinc-Vit D3 (CALCIUM -MAGNESIUM -ZINC-D3 PO) Take 2 tablets by mouth daily.    [provider]  cetirizine (ZYRTEC) 10 MG tablet Take 10 mg by mouth daily as needed for allergies.    [provider]  colchicine  0.6 MG tablet Take 1 tablet (0.6 mg total) by mouth every 6 (six) hours as needed (gout). 09/18/23 09/17/24  [name], MD  dabigatran  (PRADAXA ) 150 MG CAPS capsule TAKE (1) CAPSULE TWICE DAILY. 10/24/23   Cash [name], MD  diclofenac  Sodium (VOLTAREN ) 1 % GEL Apply 4 g topically 3 (three) times daily as needed (pain). 01/26/24   [name], MD  docusate sodium  (COLACE) 100 MG capsule Take 100 mg by mouth 2 (two) times daily.    [provider]  fluticasone  (FLONASE ) 50 MCG/ACT nasal spray Place 2 sprays into both nostrils daily. 12/26/23   [provider]  insulin  aspart (NOVOLOG ) 100 UNIT/ML injection Inject 4-16 Units into the skin as directed. Inject  as per sliding scale: if 141 - 180 = 4 units Inject per sliding scale; 181 - 220 = 6 units Inject per sliding scale; 221 - 260 = 8 units Inject per sliding scale; 261 - 300 = 10 units Inject per sliding scale; 301 - 350 = 12 units Inject per sliding scale; 351 - 400= 14 units Inject per sliding scale; 401+ = 16 units Inject per sliding scale, subcutaneously three times a day    [provider]  isosorbide  mononitrate (IMDUR ) 30 MG 24 hr tablet Take 1 tablet (30 mg total) by mouth daily. 09/02/23 12/22/24  [name], MD  ketotifen  (ZADITOR ) 0.025 % ophthalmic solution Place 1 drop  into both eyes daily as needed (for itching).    [provider]  levothyroxine  (SYNTHROID ) 175 MCG tablet Take 175 mcg by mouth daily.    [provider]  metoCLOPramide  (REGLAN ) 10 MG tablet Take 10 mg by mouth 2 (two) times daily.    [provider]  metolazone  (ZAROXOLYN ) 2.5 MG tablet Take 2.5 mg by mouth See admin instructions. Sunday and Wednesday's. 07/16/24   [provider]  metolazone  (ZAROXOLYN ) 5 MG tablet Take 1 tablet (5 mg total) every Sunday and Wednesday with extra 40 of Potassium with each metolazone  doses. Patient taking differently: Take 2.5 mg by mouth daily. Take 1 tablet (5 mg total) every Sunday and Wednesday with extra 40 of Potassium with each metolazone  doses. 07/07/24   [name], [name], FNP  metoprolol  succinate (TOPROL -XL) 100 MG 24 hr tablet Take 100 mg by mouth daily. Take with or immediately following a meal.    [provider]  montelukast  (SINGULAIR ) 10 MG tablet TAKE ONE TABLET BY MOUTH ONCE DAILY IN THE EVENING 08/20/22   Hunsucker, Donnice SAUNDERS, MD  Multiple Vitamins-Minerals (MULTI-VITAMIN GUMMIES) CHEW Chew 2 Pieces by mouth daily.    [provider]  Multiple Vitamins-Minerals (PRESERVISION AREDS 2) CAPS Take 1 capsule by mouth in the morning and at bedtime.    [provider]  oxycodone  (OXY-IR) 5 MG capsule Take 5 mg by mouth every 8 (eight) hours as needed.    [provider]  OXYGEN Inhale 2 L into the lungs as directed. Resident to wear O2 when ambulating around the unit and during therapy every shift    [provider]  pantoprazole  (PROTONIX ) 40 MG tablet Take 40 mg by mouth 2 (two) times daily.    [provider]  polyethylene glycol powder (GLYCOLAX/MIRALAX) 17 GM/SCOOP powder Take 17 g by mouth daily as needed for moderate constipation.    [provider]  potassium chloride  SA (KLOR-CON  M) 20 MEQ tablet Take 80 mEq by mouth 2 (two) times daily. Take  additional 40 mEq with each Metolazone  dose. 07/28/24   [provider]  Probiotic Product (PROBIOTIC PO) Take 1 capsule by mouth daily.    [provider]  sacubitril-valsartan (ENTRESTO ) 24-26 MG Take 0.5 tablets by mouth 2 (two) times daily.    [provider]  spironolactone  (ALDACTONE ) 25 MG tablet Take 1 tablet (25 mg total) by mouth daily. 10/24/23   [name], MD  tamsulosin (FLOMAX) 0.4 MG CAPS capsule Take 0.4 mg by mouth daily.    [provider]  Tiotropium Bromide Monohydrate  (SPIRIVA  RESPIMAT) 2.5 MCG/ACT AERS Inhale 2 puffs into the lungs daily. 02/17/24   Hope Almarie ORN, NP  torsemide  (DEMADEX ) 20 MG tablet Take 4 tablets (80 mg total) by mouth in the morning AND 4  tablets (80 mg total) every evening. 07/15/24   Marcine Catalan M, PA-C  triamcinolone  cream (KENALOG ) 0.5 % Apply topically daily as needed for dry skin (affected areas). 04/15/17   [provider]  valACYclovir  (VALTREX ) 500 MG tablet Take 500 mg by mouth every 12 (twelve) hours as needed (cold sores).    [provider]  venlafaxine  XR (EFFEXOR  XR) 37.5 MG 24 hr capsule Take 1 capsule (37.5 mg total) by mouth daily with breakfast. 10/23/23   Mast, Man X, NP    Past Medical History: Past Medical History:  Diagnosis Date   Age-related macular degeneration, dry, both eyes    Allergic    24/7; 365 days/year; I'm allergic to pollens, dust, all southern grasses/trees, mold, mildue, cats, dogs (01/07/2017)   Anal fissure    Asthma    sees Dr. Alaine    Benign prostatic hypertrophy    (sees Dr. Colan   CAD (coronary artery disease)    a. s/p CABG 2007. b. Cath 08/2016 - 4/4 patent grafts.   Carotid bruit    carotid u/s 10/10: 0.39% bilaterally   Chronic combined systolic and diastolic CHF (congestive heart failure) (HCC)    Complication of anesthesia 1980s   w/anal cyst OR, he gave me a saddle block then put a narcotic in spinal cord; had a  severe reaction to that (01/07/2017)   Congestive heart failure (CHF) [facility])    ED (erectile dysfunction)    Family history of adverse reaction to anesthesia    daughter wakes up during OR (01/07/2017)   GERD (gastroesophageal reflux disease)    Gout    HTN (hypertension)    Hx of colonic polyps    (sees Dr. Abran)   Hyperlipidemia    Hypothyroidism    Moderate to severe aortic stenosis    a. s/p TAVR 02/2017.   Myocardial infarction [facility]) ~ 2000   Obesity    Osteoarthritis    was in my knees, hands (01/07/2017 )   PAF (paroxysmal atrial fibrillation) (HCC)    a. documented post TAVR.   Precancerous skin lesion    (sees Dr. Ivin)   Prostate cancer [facility]) dx'd ~ 2014   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   29 mm Edwards Sapien 3 transcatheter heart valve placed via percutaneous right transfemoral approach    Past Surgical History: Past Surgical History:  Procedure Laterality Date   ANUS SURGERY     opened it back up cause it wouldn't heal; wound up w/a fissure (01/07/2017)   BIV ICD INSERTION CRT-D N/A 05/02/2017   Procedure: BIV ICD INSERTION CRT-D;  Surgeon: [name], MD;  Location: [facility] INVASIVE CV LAB;  Service: Cardiovascular;  Laterality: N/A;   CARDIAC CATHETERIZATION  10/29/2005   CARDIAC CATHETERIZATION N/A 08/27/2016   Procedure: Right/Left Heart Cath and Coronary/Graft Angiography;  Surgeon: Lonni JONETTA Cash, MD;  Location: [facility] INVASIVE CV LAB;  Service: Cardiovascular;  Laterality: N/A;   CARDIOVERSION N/A 04/24/2021   Procedure: CARDIOVERSION;  Surgeon: Okey Vina GAILS, MD;  Location: [facility] ENDOSCOPY;  Service: Cardiovascular;  Laterality: N/A;   CARDIOVERSION N/A 11/08/2021   Procedure: CARDIOVERSION;  Surgeon: Mona Vinie BROCKS, MD;  Location: [facility] ENDOSCOPY;  Service: Cardiovascular;  Laterality: N/A;   CARDIOVERSION N/A 02/06/2022   Procedure: CARDIOVERSION;  Surgeon: Delford Maude BROCKS, MD;  Location: [facility] ENDOSCOPY;   Service: Cardiovascular;  Laterality: N/A;   CATARACT EXTRACTION W/ INTRAOCULAR LENS  IMPLANT, BILATERAL Bilateral  CATARACT EXTRACTION, BILATERAL  2012   COLONOSCOPY  06/30/2008   no repeats needed    COLONOSCOPY     had 3 or 4 in the past    CORONARY ARTERY BYPASS GRAFT  2007   CABG X4   CYST EXCISION PERINEAL  1980s   HAMMER TOE SURGERY Bilateral    ICD GENERATOR CHANGEOUT N/A 05/30/2023   Procedure: ICD GENERATOR CHANGEOUT;  Surgeon: [name], MD;  Location: [facility] INVASIVE CV LAB;  Service: Cardiovascular;  Laterality: N/A;   JOINT REPLACEMENT     KNEE ARTHROPLASTY  07/30/2011   Procedure: COMPUTER ASSISTED TOTAL KNEE ARTHROPLASTY;  Surgeon: Cordella Glendia Hutchinson;  Location: [facility] OR;  Service: Orthopedics;  Laterality: Left;  left total knee arthroplasty   MASTECTOMY SUBCUTANEOUS Bilateral    MULTIPLE TOOTH EXTRACTIONS     ORIF FINGER / THUMB FRACTURE Right ~ 1980   repair of thumb injury   PROSTATE BIOPSY     REPLACEMENT TOTAL KNEE BILATERAL Bilateral 2012   TEE WITHOUT CARDIOVERSION N/A 03/04/2017   Procedure: TRANSESOPHAGEAL ECHOCARDIOGRAM (TEE);  Surgeon: [name], MD;  Location: [facility] OR;  Service: Open Heart Surgery;  Laterality: N/A;   TOTAL KNEE REVISION Right 05/18/2019   Procedure: RIGHT PATELLA REVISION/REMOVAL;  Surgeon: Hutchinson Cordella Glendia, MD;  Location: [facility] OR;  Service: Orthopedics;  Laterality: Right;   TRANSCATHETER AORTIC VALVE REPLACEMENT, TRANSFEMORAL N/A 03/04/2017   Procedure: TRANSCATHETER AORTIC VALVE REPLACEMENT, TRANSFEMORAL;  Surgeon: [name], MD;  Location: [facility] OR;  Service: Open Heart Surgery;  Laterality: N/A;    Family History: Family History  Problem Relation Age of Onset   Heart attack Father 75   Allergic rhinitis Father    Asthma Father    Heart failure Mother 51   Uterine cancer Mother    Breast cancer Mother    Colon cancer Neg Hx    Esophageal cancer Neg Hx     Social History: Social History    Socioeconomic History   Marital status: Married    Spouse name: Not on file   Number of children: 3   Years of education: Not on file   Highest education level: Not on file  Occupational History   Occupation: product/process development scientist    Comment: builds malls  Tobacco Use   Smoking status: Former    Current packs/day: 0.00    Average packs/day: 3.5 packs/day for 13.0 years (45.5 ttl pk-yrs)    Types: Cigarettes    Start date: 25    Quit date: 1963    Years since quitting: 62.9   Smokeless tobacco: Never   Tobacco comments:    Former smoker 04/19/21  Vaping Use   Vaping status: Never Used  Substance and Sexual Activity   Alcohol use: Not Currently    Alcohol/week: 7.0 standard drinks of alcohol    Types: 7 Cans of beer per week    Comment: 1 beer daily after 5pm (04/19/21)   Drug use: No   Sexual activity: Never  Other Topics Concern   Not on file  Social History Narrative   FH of CAD, Male 1st degree relative less than age 10.   Social Drivers of Corporate Investment Banker Strain: Low Risk  (03/31/2023)   Overall Financial Resource Strain (CARDIA)    Difficulty of Paying Living Expenses: Not very hard  Food Insecurity: No Food Insecurity (11/18/2023)   Hunger Vital Sign    Worried About Running Out of Food in the Last Year: Never true  Ran Out of Food in the Last Year: Never true  Transportation Needs: No Transportation Needs (11/18/2023)   PRAPARE - Administrator, Civil Service (Medical): No    Lack of Transportation (Non-Medical): No  Physical Activity: Sufficiently Active (03/31/2023)   Exercise Vital Sign    Days of Exercise per Week: 7 days    Minutes of Exercise per Session: 30 min  Stress: No Stress Concern Present (03/31/2023)   [employer] of Occupational Health - Occupational Stress Questionnaire    Feeling of Stress : Not at all  Social Connections: Socially Integrated (11/18/2023)   Social Connection and Isolation Panel    Frequency of  Communication with Friends and Family: Three times a week    Frequency of Social Gatherings with Friends and Family: Three times a week    Attends Religious Services: More than 4 times per year    Active Member of Clubs or Organizations: Yes    Attends Banker Meetings: More than 4 times per year    Marital Status: Married    Allergies:  Allergies  Allergen Reactions   Peanut-Containing Drug Products Anaphylaxis   Sulfonamide Derivatives Anaphylaxis   Amlodipine  Swelling and Other (See Comments)    Swelling in ankles   Eliquis  [Apixaban ] Other (See Comments)    Back/hip pain   Lisinopril  Cough   Xarelto  [Rivaroxaban ] Other (See Comments)    Back/hip pain    Objective:    Vital Signs:   Temp:  [97.9 F (36.6 C)] 97.9 F (36.6 C) (12/09 1147) Pulse Rate:  [86-94] 94 (12/09 1303) Resp:  [15-17] 15 (12/09 1303) BP: (78-110)/(57-78) 110/78 (12/09 1303) SpO2:  [96 %-100 %] 100 % (12/09 1303)    Weight change: There were no vitals filed for this visit.  Intake/Output:  No intake or output data in the 24 hours ending 08/03/24 1314    Physical Exam    General:   No resp difficulty Neck: JVP 7-8  Cor: Irregular rate & rhythm.  Lungs: clear Abdomen: soft, nontender, nondistended.  Extremities: no  edema Neuro: alert & oriented x3    Telemetry  A Fib   EKG     Afib 81 bpm   Labs   Basic Metabolic Panel: Recent Labs  Lab 08/03/24 1200  NA 134*  K 4.1  CL 96*  CO2 27  GLUCOSE 100*  BUN 52*  CREATININE 2.56*  CALCIUM  8.1*    Liver Function Tests: Recent Labs  Lab 08/03/24 1200  AST 26  ALT 15  ALKPHOS 87  BILITOT 1.0  PROT 6.3*  ALBUMIN  2.3*   No results for input(s): LIPASE, AMYLASE in the last 168 hours. No results for input(s): AMMONIA in the last 168 hours.  CBC: Recent Labs  Lab 08/03/24 1200  WBC 11.8*  NEUTROABS 7.9*  HGB 11.6*  HCT 35.7*  MCV 88.8  PLT 272    Cardiac Enzymes: No results for input(s):  CKTOTAL, CKMB, CKMBINDEX, TROPONINI in the last 168 hours.  BNP: BNP (last 3 results) Recent Labs    04/14/24 1637 07/15/24 1227 07/21/24 1615  BNP 489.4* 653.6* 716.3*    ProBNP (last 3 results) No results for input(s): PROBNP in the last 8760 hours.   CBG: No results for input(s): GLUCAP in the last 168 hours.  Coagulation Studies: No results for input(s): LABPROT, INR in the last 72 hours.   Imaging   DG Chest Port 1 View Result Date: 08/03/2024 CLINICAL DATA:  Weakness. EXAM: PORTABLE  CHEST 1 VIEW COMPARISON:  01/20/2024. FINDINGS: Cardiomegaly, unchanged. Status post TAVR, CABG, and median sternotomy. Stable left chest wall AICD. Pulmonary vascular congestion with possible mild interstitial edema. No focal consolidation, sizeable pleural effusion, or pneumothorax. No acute osseous abnormality. IMPRESSION: Cardiomegaly with pulmonary vascular congestion and possible mild interstitial edema. Electronically Signed   By: Harrietta Sherry M.D.   On: 08/03/2024 12:55     Medications:     Current Medications:  azithromycin   500 mg Oral Daily    Infusions:     Patient Profile   Justin [name] is a 89 year old with a history of permanent A fib, AS S/P TAVR, medtronic BiV ICD, CKD Stage IIIa, CAD, CABG 2007, and chronic HFrEF.   Admitting with hypotension & AKI on CKD Stage IIIa.  Assessment/Plan  #AKI on CKD Stage IIIa #Hypotension  Suspect he is over diuresed. Given IV fluids. Hold diuretics and entresto  for now.  I am not sure if he is taking metolazone  daily. For now will hold.  Repeat BMET in am.   # Chronic Biventricular HFrEF, ICM/NICM  Echo 10/2023 LVEF 40-45% RV moderately reduced  Has CRT-D Medtronic - Optivol, fluid index well below threshold. Impedance up. On exam he does not appear volume overloaded. Hold torsemide  and entresto , and Toprol  XL.   # Permanent A fib/Frequent PVCs  Rate controlled. Restart amio 200 mg daily. Hold Toprol  XL    Restart pradaxa  150 twice a day   #Hypothyroid TSH 1.9 Free T4 1.3  # AS H/O TAVR 2018   #DNR   HF Team will follow .   Length of Stay: 0  Greig Mosses, NP  08/03/2024, 1:14 PM    Advanced Heart Failure Team Pager [phone] (M-F; 7a - 5p)  Please contact [facility] for night-coverage after hours (4p -7a ) and weekends on [email or web address]

## 2024-08-03 NOTE — ED Notes (Signed)
 Cleaned patients soiled linen, patient clean and dry. Denies other need at this time.

## 2024-08-03 NOTE — ED Provider Notes (Addendum)
 [place] EMERGENCY DEPARTMENT AT [facility] Provider Note  CSN: [id number] Arrival date & time: 08/03/24 1133  Chief Complaint(s) Weakness  HPI Justin [name] is a 89 y.o. male with past medical history as below, significant for CAD status post CABG 2007, combined heart failure, GERD, hypertension, asthma who presents to the ED with complaint of weakness, hypotension  Patient reports some generalized weakness over the past few days, he was seen at cardiology office this morning and noted to have significant hypotension.  He was sent to the ER for evaluation.  Pressure 60s over 40s, EMS provided small bolus of fluid and pressure did improved to 80s over 50s.  Patient reports some generalized weakness and exercise intolerance.  Increased fatigue over the past few days.  No chest pain or dyspnea, no nausea or vomiting, no change to bowel or bladder function, no dietary changes.  F/w Dr. Verlin cardiology  Past Medical History Past Medical History:  Diagnosis Date   Age-related macular degeneration, dry, both eyes    Allergic    24/7; 365 days/year; I'm allergic to pollens, dust, all southern grasses/trees, mold, mildue, cats, dogs (01/07/2017)   Anal fissure    Asthma    sees Dr. Alaine    Benign prostatic hypertrophy    (sees Dr. Colan   CAD (coronary artery disease)    a. s/p CABG 2007. b. Cath 08/2016 - 4/4 patent grafts.   Carotid bruit    carotid u/s 10/10: 0.39% bilaterally   Chronic combined systolic and diastolic CHF (congestive heart failure) (HCC)    Complication of anesthesia 1980s   w/anal cyst OR, he gave me a saddle block then put a narcotic in spinal cord; had a severe reaction to that (01/07/2017)   Congestive heart failure (CHF) (HCC)    Coronavirus infection 11/18/2023   ED (erectile dysfunction)    Family history of adverse reaction to anesthesia    daughter wakes up during OR (01/07/2017)   GERD (gastroesophageal reflux disease)    Gout     HTN (hypertension)    Hx of colonic polyps    (sees Dr. Abran)   Hyperlipidemia    Hypothyroidism    Moderate to severe aortic stenosis    a. s/p TAVR 02/2017.   Myocardial infarction [facility]) ~ 2000   Obesity    Osteoarthritis    was in my knees, hands (01/07/2017 )   PAF (paroxysmal atrial fibrillation) (HCC)    a. documented post TAVR.   Precancerous skin lesion    (sees Dr. 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   29 mm Edwards Sapien 3 transcatheter heart valve placed via percutaneous right transfemoral approach   Patient Active Problem List   Diagnosis Date Noted   Hypotension 08/03/2024   Mild cognitive impairment 08/02/2024   Dry skin dermatitis 07/30/2024   COPD (chronic obstructive pulmonary disease) (HCC) 05/27/2024   Hyponatremia 02/16/2024   UTI (urinary tract infection) 12/19/2023   Recurrent depression 12/19/2023   Candidiasis 12/15/2023   Pleural effusion 11/20/2023   Asymptomatic bacteriuria 11/20/2023   Bronchitis 11/06/2023   Type 2 diabetes mellitus (HCC) 10/23/2023   Leukocytosis 10/16/2023   Right ventricular dysfunction 07/05/2023   Chronic kidney disease, stage 3a (HCC) 07/05/2023   Persistent atrial fibrillation (HCC) 07/05/2023   Long term (current) use of anticoagulants 07/05/2023   NSVT (nonsustained ventricular tachycardia) (HCC) 05/23/2022  Sinus node dysfunction (HCC) 05/23/2022   Frequent PVCs 05/23/2022   Biventricular implantable cardioverter-defibrillator (ICD) in situ 03/12/2022   Atypical atrial flutter (HCC) 01/30/2022   Secondary hypercoagulable state 04/19/2021   Raynaud's phenomenon without gangrene 07/11/2020   Vertigo 07/11/2020   Loosening of knee joint prosthesis 05/18/2019   Cough 04/30/2019   Gastroesophageal reflux disease 04/30/2019   S/P ICD (internal cardiac defibrillator) procedure 05/03/2017   NICM (nonischemic  cardiomyopathy) (HCC) 05/02/2017   History of transcatheter aortic valve replacement (TAVR) 03/04/2017   Severe aortic valve stenosis 03/04/2017   Hypokalemia due to loss of potassium with diuresis 01/10/2017   Aortic stenosis 01/10/2017   Chronic systolic CHF (congestive heart failure) (HCC) 01/10/2017   Chronic congestive heart failure (HCC) 01/07/2017   Hypothyroidism 12/23/2016   Prostate cancer (HCC)    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   Edema of both legs 09/01/2014   Asthma, intermittent 09/01/2014   Epiphora due to insufficient drainage 07/02/2011   Rosacea blepharoconjunctivitis 07/02/2011   Coronary artery disease involving native coronary artery of native heart without angina pectoris    GOUT 09/12/2009   CAD, AUTOLOGOUS BYPASS GRAFT 05/10/2009   MURMUR 05/08/2009   BRUIT 05/08/2009   MUSCLE CRAMPS 12/12/2008   KNEE SPRAIN 09/21/2008   Osteoarthritis 07/07/2008   BPH with urinary obstruction 02/01/2008   LOW BACK PAIN SYNDROME 02/01/2008   TESTOSTERONE DEFICIENCY 07/03/2007   Hyperlipidemia 07/03/2007   Essential hypertension 07/03/2007   MYOCARDIAL INFARCTION, HX OF 07/03/2007   Allergic rhinitis 07/03/2007   History of colonic polyps 07/03/2007   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   Home Medication(s) Prior to Admission medications   Medication Sig Start Date End Date Taking? Authorizing Provider  albuterol  (VENTOLIN  HFA) 108 (90 Base) MCG/ACT inhaler Inhale 2 puffs into the lungs every 4 (four) hours as needed for wheezing or shortness of breath. 05/06/23   [name], MD  allopurinol  (ZYLOPRIM ) 100 MG tablet Take 1 tablet (100 mg total) by mouth daily. 09/18/23   [name], MD  amiodarone  (PACERONE ) 200 MG tablet Take 1 tablet (200 mg total) by mouth daily. 01/14/24   [name], MD  aspirin  81 MG tablet Take 1 tablet (81 mg total) by mouth daily. 05/19/19   Magnant, Charles L, PA-C  Baclofen 5 MG TABS Take 5 mg by mouth every 12 (twelve) hours as needed  (Muscle Spasms).    [provider]  calcium  carbonate (OS-CAL) 1250 (500 Ca) MG chewable tablet Chew 1 tablet by mouth daily.    [provider]  Calcium -Magnesium -Zinc-Vit D3 (CALCIUM -MAGNESIUM -ZINC-D3 PO) Take 2 tablets by mouth daily.    [provider]  cetirizine (ZYRTEC) 10 MG tablet Take 10 mg by mouth daily as needed for allergies.    [provider]  colchicine  0.6 MG tablet Take 1 tablet (0.6 mg total) by mouth every 6 (six) hours as needed (gout). 09/18/23 09/17/24  [name], MD  dabigatran  (PRADAXA ) 150 MG CAPS capsule TAKE (1) CAPSULE TWICE DAILY. 10/24/23   [name], MD  diclofenac  Sodium (VOLTAREN ) 1 % GEL Apply 4 g topically 3 (three) times daily as needed (pain). 01/26/24   [name], MD  docusate sodium  (COLACE) 100 MG capsule Take 100 mg by mouth 2 (two) times daily.    [provider]  fluticasone  (FLONASE ) 50 MCG/ACT nasal spray Place 2 sprays into both nostrils daily. 12/26/23   [provider]  insulin  aspart (NOVOLOG ) 100 UNIT/ML injection Inject 4-16 Units  into the skin as directed. Inject as per sliding scale: if 141 - 180 = 4 units Inject per sliding scale; 181 - 220 = 6 units Inject per sliding scale; 221 - 260 = 8 units Inject per sliding scale; 261 - 300 = 10 units Inject per sliding scale; 301 - 350 = 12 units Inject per sliding scale; 351 - 400= 14 units Inject per sliding scale; 401+ = 16 units Inject per sliding scale, subcutaneously three times a day    [provider]  isosorbide  mononitrate (IMDUR ) 30 MG 24 hr tablet Take 1 tablet (30 mg total) by mouth daily. 09/02/23 12/22/24  [name], MD  ketotifen  (ZADITOR ) 0.025 % ophthalmic solution Place 1 drop into both eyes daily as needed (for itching).    [provider]  levothyroxine  (SYNTHROID ) 175 MCG tablet Take 175 mcg by mouth daily.    [provider]  metoCLOPramide  (REGLAN ) 10 MG tablet Take 10 mg  by mouth 2 (two) times daily.    [provider]  metolazone  (ZAROXOLYN ) 2.5 MG tablet Take 2.5 mg by mouth See admin instructions. Sunday and Wednesday's. 07/16/24   [provider]  metolazone  (ZAROXOLYN ) 5 MG tablet Take 1 tablet (5 mg total) every Sunday and Wednesday with extra 40 of Potassium with each metolazone  doses. Patient taking differently: Take 2.5 mg by mouth daily. Take 1 tablet (5 mg total) every Sunday and Wednesday with extra 40 of Potassium with each metolazone  doses. 07/07/24   Glena [name], FNP  metoprolol  succinate (TOPROL -XL) 100 MG 24 hr tablet Take 100 mg by mouth daily. Take with or immediately following a meal.    [provider]  montelukast  (SINGULAIR ) 10 MG tablet TAKE ONE TABLET BY MOUTH ONCE DAILY IN THE EVENING 08/20/22   Hunsucker, Donnice SAUNDERS, MD  Multiple Vitamins-Minerals (PRESERVISION AREDS 2) CAPS Take 1 capsule by mouth in the morning and at bedtime.    [provider]  OXYGEN Inhale 2 L into the lungs as directed. Resident to wear O2 when ambulating around the unit and during therapy every shift    [provider]  pantoprazole  (PROTONIX ) 40 MG tablet Take 40 mg by mouth 2 (two) times daily.    [provider]  polyethylene glycol powder (GLYCOLAX/MIRALAX) 17 GM/SCOOP powder Take 17 g by mouth daily as needed for moderate constipation.    [provider]  potassium chloride  SA (KLOR-CON  M) 20 MEQ tablet Take 80 mEq by mouth 2 (two) times daily. Take additional 40 mEq with each Metolazone  dose. 07/28/24   [provider]  Probiotic Product (PROBIOTIC PO) Take 1 capsule by mouth daily.    [provider]  sacubitril-valsartan (ENTRESTO ) 24-26 MG Take 0.5 tablets by mouth 2 (two) times daily.    [provider]  spironolactone  (ALDACTONE ) 25 MG tablet Take 1 tablet (25 mg total) by mouth daily. 10/24/23   [name], MD  tamsulosin (FLOMAX) 0.4 MG CAPS capsule  Take 0.4 mg by mouth daily.    [provider]  Tiotropium Bromide Monohydrate  (SPIRIVA  RESPIMAT) 2.5 MCG/ACT AERS Inhale 2 puffs into the lungs daily. 02/17/24   Hope Almarie ORN, NP  torsemide  (DEMADEX ) 20 MG tablet Take 4 tablets (80 mg total) by mouth in the morning AND 4 tablets (80 mg total) every evening. 07/15/24   Marcine Catalan M, PA-C  triamcinolone  cream (KENALOG ) 0.5 % Apply topically daily as needed for dry skin (affected areas). 04/15/17   [provider]  valACYclovir  (VALTREX ) 500 MG tablet Take 500 mg by mouth every 12 (twelve) hours as needed (cold sores).    [provider]  venlafaxine  XR (EFFEXOR  XR) 37.5 MG 24 hr capsule Take 1 capsule (37.5 mg total) by mouth daily with breakfast. 10/23/23   Mast, Man X, NP                                                                                                                                    Past Surgical History Past Surgical History:  Procedure Laterality Date   ANUS SURGERY     opened it back up cause it wouldn't heal; wound up w/a fissure (01/07/2017)   BIV ICD INSERTION CRT-D N/A 05/02/2017   Procedure: BIV ICD INSERTION CRT-D;  Surgeon: [name], MD;  Location: [facility] INVASIVE CV LAB;  Service: Cardiovascular;  Laterality: N/A;   CARDIAC CATHETERIZATION  10/29/2005   CARDIAC CATHETERIZATION N/A 08/27/2016   Procedure: Right/Left Heart Cath and Coronary/Graft Angiography;  Surgeon: Lonni JONETTA Cash, MD;  Location: [facility] INVASIVE CV LAB;  Service: Cardiovascular;  Laterality: N/A;   CARDIOVERSION N/A 04/24/2021   Procedure: CARDIOVERSION;  Surgeon: Okey Vina GAILS, MD;  Location: [facility] ENDOSCOPY;  Service: Cardiovascular;  Laterality: N/A;   CARDIOVERSION N/A 11/08/2021   Procedure: CARDIOVERSION;  Surgeon: Mona Vinie BROCKS, MD;  Location: [facility] ENDOSCOPY;  Service: Cardiovascular;  Laterality: N/A;   CARDIOVERSION N/A 02/06/2022   Procedure: CARDIOVERSION;  Surgeon: Delford Maude BROCKS, MD;  Location: [facility]  ENDOSCOPY;  Service: Cardiovascular;  Laterality: N/A;   CATARACT EXTRACTION W/ INTRAOCULAR LENS  IMPLANT, BILATERAL Bilateral    CATARACT EXTRACTION, BILATERAL  2012   COLONOSCOPY  06/30/2008   no repeats needed    COLONOSCOPY     had 3 or 4 in the past    CORONARY ARTERY BYPASS GRAFT  2007   CABG X4   CYST EXCISION PERINEAL  1980s   HAMMER TOE SURGERY Bilateral    ICD GENERATOR CHANGEOUT N/A 05/30/2023   Procedure: ICD GENERATOR CHANGEOUT;  Surgeon: [name], MD;  Location: [facility] INVASIVE CV LAB;  Service: Cardiovascular;  Laterality: N/A;   JOINT REPLACEMENT     KNEE ARTHROPLASTY  07/30/2011   Procedure: COMPUTER ASSISTED TOTAL KNEE ARTHROPLASTY;  Surgeon: Cordella Glendia Hutchinson;  Location: [facility] OR;  Service: Orthopedics;  Laterality: Left;  left total knee arthroplasty   MASTECTOMY SUBCUTANEOUS Bilateral    MULTIPLE TOOTH EXTRACTIONS     ORIF FINGER / THUMB FRACTURE Right ~ 1980   repair of thumb injury   PROSTATE BIOPSY     REPLACEMENT TOTAL KNEE BILATERAL Bilateral 2012   TEE WITHOUT CARDIOVERSION N/A 03/04/2017   Procedure: TRANSESOPHAGEAL ECHOCARDIOGRAM (TEE);  Surgeon: Cash Lonni JONETTA, MD;  Location: [facility] OR;  Service: Open Heart Surgery;  Laterality: N/A;   TOTAL KNEE REVISION Right 05/18/2019   Procedure: RIGHT PATELLA REVISION/REMOVAL;  Surgeon: Hutchinson Cordella Glendia, MD;  Location: [facility]  OR;  Service: Orthopedics;  Laterality: Right;   TRANSCATHETER AORTIC VALVE REPLACEMENT, TRANSFEMORAL N/A 03/04/2017   Procedure: TRANSCATHETER AORTIC VALVE REPLACEMENT, TRANSFEMORAL;  Surgeon: [name], MD;  Location: [facility] OR;  Service: Open Heart Surgery;  Laterality: N/A;   Family History Family History  Problem Relation Age of Onset   Heart attack Father 61   Allergic rhinitis Father    Asthma Father    Heart failure Mother 60   Uterine cancer Mother    Breast cancer Mother    Colon cancer Neg Hx    Esophageal cancer Neg Hx     Social History Social History    Tobacco Use   Smoking status: Former    Current packs/day: 0.00    Average packs/day: 3.5 packs/day for 13.0 years (45.5 ttl pk-yrs)    Types: Cigarettes    Start date: 74    Quit date: 1963    Years since quitting: 62.9   Smokeless tobacco: Never   Tobacco comments:    Former smoker 04/19/21  Vaping Use   Vaping status: Never Used  Substance Use Topics   Alcohol use: Not Currently    Alcohol/week: 7.0 standard drinks of alcohol    Types: 7 Cans of beer per week    Comment: 1 beer daily after 5pm (04/19/21)   Drug use: No   Allergies Peanut (diagnostic), Peanut-containing drug products, Sulfa antibiotics, Sulfonamide derivatives, Eliquis  [apixaban ], Norvasc  Athenios.bern ], Xarelto  [rivaroxaban ], Zestril  [lisinopril ], and Lactose  Review of Systems A thorough review of systems was obtained and all systems are negative except as noted in the HPI and PMH.   Physical Exam Vital Signs  I have reviewed the triage vital signs BP 110/78   Pulse 94   Temp 97.9 F (36.6 C) (Oral)   Resp 15   SpO2 100%  Physical Exam Vitals and nursing note reviewed.  Constitutional:      General: He is not in acute distress.    Appearance: He is well-developed.  HENT:     Head: Normocephalic and atraumatic.     Right Ear: External ear normal.     Left Ear: External ear normal.     Mouth/Throat:     Mouth: Mucous membranes are dry.  Eyes:     General: No scleral icterus. Cardiovascular:     Rate and Rhythm: Normal rate. Rhythm irregular.     Pulses: Normal pulses.     Heart sounds: Normal heart sounds.  Pulmonary:     Effort: Pulmonary effort is normal. No respiratory distress.     Breath sounds: Normal breath sounds.  Abdominal:     General: Abdomen is flat.     Palpations: Abdomen is soft.     Tenderness: There is no abdominal tenderness.  Musculoskeletal:     Cervical back: No rigidity.     Right lower leg: No edema.     Left lower leg: No edema.  Skin:    General: Skin is  warm and dry.     Capillary Refill: Capillary refill takes less than 2 seconds.  Neurological:     Mental Status: He is alert.  Psychiatric:        Mood and Affect: Mood normal.        Behavior: Behavior normal.     ED Results and Treatments Labs (all labs ordered are listed, but only abnormal results are displayed) Labs Reviewed  BRAIN NATRIURETIC PEPTIDE - Abnormal; Notable for the following components:      Result  Value   B Natriuretic Peptide 674.3 (*)    All other components within normal limits  COMPREHENSIVE METABOLIC PANEL WITH GFR - Abnormal; Notable for the following components:   Sodium 134 (*)    Chloride 96 (*)    Glucose, Bld 100 (*)    BUN 52 (*)    Creatinine, Ser 2.56 (*)    Calcium  8.1 (*)    Total Protein 6.3 (*)    Albumin  2.3 (*)    GFR, Estimated 23 (*)    All other components within normal limits  CBC WITH DIFFERENTIAL/PLATELET - Abnormal; Notable for the following components:   WBC 11.8 (*)    RBC 4.02 (*)    Hemoglobin 11.6 (*)    HCT 35.7 (*)    RDW 16.6 (*)    Neutro Abs 7.9 (*)    Monocytes Absolute 1.4 (*)    All other components within normal limits  T4, FREE - Abnormal; Notable for the following components:   Free T4 1.32 (*)    All other components within normal limits  TSH  TROPONIN I (HIGH SENSITIVITY)  TROPONIN I (HIGH SENSITIVITY)                                                                                                                          Radiology DG Chest Port 1 View Result Date: 08/03/2024 CLINICAL DATA:  Weakness. EXAM: PORTABLE CHEST 1 VIEW COMPARISON:  01/20/2024. FINDINGS: Cardiomegaly, unchanged. Status post TAVR, CABG, and median sternotomy. Stable left chest wall AICD. Pulmonary vascular congestion with possible mild interstitial edema. No focal consolidation, sizeable pleural effusion, or pneumothorax. No acute osseous abnormality. IMPRESSION: Cardiomegaly with pulmonary vascular congestion and possible mild  interstitial edema. Electronically Signed   By: Harrietta Sherry M.D.   On: 08/03/2024 12:55    Pertinent labs & imaging results that were available during my care of the patient were reviewed by me and considered in my medical decision making (see MDM for details).  Medications Ordered in ED Medications  sodium chloride  0.9 % bolus 500 mL (500 mLs Intravenous New Bag/Given 08/03/24 1152)                                                                                                                                     Procedures .Critical Care  Performed by: Elnor Jayson LABOR, DO Authorized by: Elnor Jayson LABOR, DO  Critical care provider statement:    Critical care time (minutes):  30   Critical care time was exclusive of:  Separately billable procedures and treating other patients   Critical care was necessary to treat or prevent imminent or life-threatening deterioration of the following conditions:  Circulatory failure   Critical care was time spent personally by me on the following activities:  Development of treatment plan with patient or surrogate, discussions with consultants, evaluation of patient's response to treatment, examination of patient, ordering and review of laboratory studies, ordering and review of radiographic studies, ordering and performing treatments and interventions, pulse oximetry, re-evaluation of patient's condition, review of old charts and obtaining history from patient or surrogate   Care discussed with: admitting provider     (including critical care time)  Medical Decision Making / ED Course    Medical Decision Making:    NYLES MITTON is a 89 y.o. male  with past medical history as below, significant for CAD status post CABG 2007, combined heart failure, GERD, hypertension, asthma who presents to the ED with complaint of weakness, hypotension. The complaint involves an extensive differential diagnosis and also carries with it a high risk of  complications and morbidity.  Serious etiology was considered. Ddx includes but is not limited to: Overdiuresis, volume loss, electrolyte derangement, medication effect, ACS, infection, thyroid  dysfunction, etc.  Complete initial physical exam performed, notably the patient was in no acute distress, he is quite hypotensive.    Reviewed and confirmed nursing documentation for past medical history, family history, social history.  Vital signs reviewed.    Hypotension> - No vomiting or diarrhea, no significant change to p.o. intake.  He is on torsemide  concern for possible overdiuresis - Blood pressure 60s over 40s, on arrival 78/57.  Received 500 mL fluid bolus prior to arrival, will give another 500 mL. - BP improved with fluids - AKI noted - likely over-diuresed - EF is low, judicious fluids - he has no cp or dib, EKG paced/afib - spoke w/ cardiology - admit [facility]   Clinical Course as of 08/03/24 1412  Tue Aug 03, 2024  1139 2D echo 3/25 with LVEF 40 to 45% [SG]  1316 BP: 110/78 BP improving  [SG]  1316 Creatinine(!): 2.56 Worsening over last 3 mos, ?2/2 over-diuresis [SG]  1337 Consulted cardiology/ recommend medicine admit [SG]    Clinical Course User Index [SG] Elnor Jayson LABOR, DO                    Additional history obtained: -Additional history obtained from ems -External records from outside source obtained and reviewed including: Chart review including previous notes, labs, imaging, consultation notes including  Recent cardiology office note Prior echo Home meds   Lab Tests: -I ordered, reviewed, and interpreted labs.   The pertinent results include:   Labs Reviewed  BRAIN NATRIURETIC PEPTIDE - Abnormal; Notable for the following components:      Result Value   B Natriuretic Peptide 674.3 (*)    All other components within normal limits  COMPREHENSIVE METABOLIC PANEL WITH GFR - Abnormal; Notable for the following components:   Sodium 134 (*)     Chloride 96 (*)    Glucose, Bld 100 (*)    BUN 52 (*)    Creatinine, Ser 2.56 (*)    Calcium  8.1 (*)    Total Protein 6.3 (*)    Albumin  2.3 (*)    GFR, Estimated 23 (*)    All other  components within normal limits  CBC WITH DIFFERENTIAL/PLATELET - Abnormal; Notable for the following components:   WBC 11.8 (*)    RBC 4.02 (*)    Hemoglobin 11.6 (*)    HCT 35.7 (*)    RDW 16.6 (*)    Neutro Abs 7.9 (*)    Monocytes Absolute 1.4 (*)    All other components within normal limits  T4, FREE - Abnormal; Notable for the following components:   Free T4 1.32 (*)    All other components within normal limits  TSH  TROPONIN I (HIGH SENSITIVITY)  TROPONIN I (HIGH SENSITIVITY)    Notable for aki, bun +  EKG   EKG Interpretation Date/Time:    Ventricular Rate:    PR Interval:    QRS Duration:    QT Interval:    QTC Calculation:   R Axis:      Text Interpretation:           Imaging Studies ordered: I ordered imaging studies including CXR I independently visualized the following imaging with scope of interpretation limited to determining acute life threatening conditions related to emergency care; findings noted above I agree with the radiologist interpretation If any imaging was obtained with contrast I closely monitored patient for any possible adverse reaction a/w contrast administration in the emergency department   Medicines ordered and prescription drug management: Meds ordered this encounter  Medications   sodium chloride  0.9 % bolus 500 mL    -I have reviewed the patients home medicines and have made adjustments as needed   Consultations Obtained: I requested consultation with the cardiology service, [facility],  and discussed lab and imaging findings as well as pertinent plan    Cardiac Monitoring: The patient was maintained on a cardiac monitor.  I personally viewed and interpreted the cardiac monitored which showed an underlying rhythm of: afib/paced  Continuous  pulse oximetry interpreted by myself, 99% on RA.    Social Determinants of Health:  Diagnosis or treatment significantly limited by social determinants of health: former smoker and obesity   Reevaluation: After the interventions noted above, I reevaluated the patient and found that they have improved  Co morbidities that complicate the patient evaluation  Past Medical History:  Diagnosis Date   Age-related macular degeneration, dry, both eyes    Allergic    24/7; 365 days/year; I'm allergic to pollens, dust, all southern grasses/trees, mold, mildue, cats, dogs (01/07/2017)   Anal fissure    Asthma    sees Dr. Alaine    Benign prostatic hypertrophy    (sees Dr. Colan   CAD (coronary artery disease)    a. s/p CABG 2007. b. Cath 08/2016 - 4/4 patent grafts.   Carotid bruit    carotid u/s 10/10: 0.39% bilaterally   Chronic combined systolic and diastolic CHF (congestive heart failure) (HCC)    Complication of anesthesia 1980s   w/anal cyst OR, he gave me a saddle block then put a narcotic in spinal cord; had a severe reaction to that (01/07/2017)   Congestive heart failure (CHF) (HCC)    Coronavirus infection 11/18/2023   ED (erectile dysfunction)    Family history of adverse reaction to anesthesia    daughter wakes up during OR (01/07/2017)   GERD (gastroesophageal reflux disease)    Gout    HTN (hypertension)    Hx of colonic polyps    (sees Dr. Abran)   Hyperlipidemia    Hypothyroidism    Moderate to severe aortic stenosis  a. s/p TAVR 02/2017.   Myocardial infarction [facility]) ~ 2000   Obesity    Osteoarthritis    was in my knees, hands (01/07/2017 )   PAF (paroxysmal atrial fibrillation) (HCC)    a. documented post TAVR.   Precancerous skin lesion    (sees Dr. 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    Final Clinical Impression(s) / ED Diagnoses Final diagnoses:  Hypotension, unspecified hypotension type  Weakness  AKI (acute kidney injury)        Elnor Jayson LABOR, DO 08/03/24 1412    Elnor Jayson LABOR, DO 08/03/24 1415

## 2024-08-03 NOTE — H&P (Signed)
 History and Physical   Justin [name] FMW:[id number] DOB: 04-29-1934 DOA: 08/03/2024  PCP: Mast, Man X, NP   Patient coming from: Cardiology clinic  Chief Complaint: Hypotension  HPI: Justin [name] is a 89 y.o. male with medical history significant of hypertension, hyperlipidemia, GERD, NSVT, atrial fibrillation, CAD status post CABG, hypothyroidism, chronic biventricular heart failure, depression, mild cognitive impairment, prostate cancer, BPH, gout, COPD, asthma, CKD 3A, status post TAVR, ICD in place presenting with weakness and hypotension.  Patient seen in cardiology clinic this morning.  Noted to be hypotensive and recommended transport to the ED.  Patient in the 60s systolic initially and EMS gave 300 to 500 cc bolus and route with improvement to the 80s systolic.  Patient had been seen at heart failure clinic on 11/20 and was volume overloaded at that time.  Torsemide  dose was increased to 80 mg twice daily at that time.  Patient denies fevers, chills, chest pain, abdominal pain, constipation, diarrhea, nausea, vomiting.  ED Course: All signs in ED notable for blood pressure initially in the 60s systolic as above, improved to the 80s systolic initially and then after further IV fluids has improved to the 100s-110 systolic.  Lab workup included CMP with sodium 134, chloride 96, BUN 52, creatinine elevated to 2.56 from baseline of 1.5, glucose 100, calcium  8.1, protein 6.3, albumin  2.3.  CBC with mild largely stable leukocytosis to 11.8, hemoglobin 11.6 which is down from baseline of 13.  Troponin normal.  BNP pending.  Chest x-ray showed cardiomegaly with vascular congestion and possible mild edema.  Patient received 500 cc IV fluid in the ED, cardiology/heart failure team consulted and are following.  Review of Systems: As per HPI otherwise all other systems reviewed and are negative.  Past Medical History:  Diagnosis Date   Age-related macular degeneration, dry, both eyes     Allergic    24/7; 365 days/year; I'm allergic to pollens, dust, all southern grasses/trees, mold, mildue, cats, dogs (01/07/2017)   Anal fissure    Asthma    sees Dr. Alaine    Benign prostatic hypertrophy    (sees Dr. Colan   CAD (coronary artery disease)    a. s/p CABG 2007. b. Cath 08/2016 - 4/4 patent grafts.   Carotid bruit    carotid u/s 10/10: 0.39% bilaterally   Chronic combined systolic and diastolic CHF (congestive heart failure) (HCC)    Complication of anesthesia 1980s   w/anal cyst OR, he gave me a saddle block then put a narcotic in spinal cord; had a severe reaction to that (01/07/2017)   Congestive heart failure (CHF) (HCC)    Coronavirus infection 11/18/2023   ED (erectile dysfunction)    Family history of adverse reaction to anesthesia    daughter wakes up during OR (01/07/2017)   GERD (gastroesophageal reflux disease)    Gout    HTN (hypertension)    Hx of colonic polyps    (sees Dr. Abran)   Hyperlipidemia    Hypothyroidism    Moderate to severe aortic stenosis    a. s/p TAVR 02/2017.   Myocardial infarction [facility]) ~ 2000   Obesity    Osteoarthritis    was in my knees, hands (01/07/2017 )   PAF (paroxysmal atrial fibrillation) (HCC)    a. documented post TAVR.   Precancerous skin lesion    (sees Dr. Ivin)   Prostate cancer [facility]) dx'd ~ 2014   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   S/P TAVR (transcatheter aortic valve  replacement) 03/04/2017   29 mm Edwards Sapien 3 transcatheter heart valve placed via percutaneous right transfemoral approach    Past Surgical History:  Procedure Laterality Date   ANUS SURGERY     opened it back up cause it wouldn't heal; wound up w/a fissure (01/07/2017)   BIV ICD INSERTION CRT-D N/A 05/02/2017   Procedure: BIV ICD INSERTION CRT-D;  Surgeon: [name], MD;  Location: [facility] INVASIVE CV LAB;  Service: Cardiovascular;  Laterality: N/A;   CARDIAC CATHETERIZATION  10/29/2005   CARDIAC CATHETERIZATION N/A 08/27/2016    Procedure: Right/Left Heart Cath and Coronary/Graft Angiography;  Surgeon: Lonni JONETTA Cash, MD;  Location: [facility] INVASIVE CV LAB;  Service: Cardiovascular;  Laterality: N/A;   CARDIOVERSION N/A 04/24/2021   Procedure: CARDIOVERSION;  Surgeon: Okey Vina GAILS, MD;  Location: [facility] ENDOSCOPY;  Service: Cardiovascular;  Laterality: N/A;   CARDIOVERSION N/A 11/08/2021   Procedure: CARDIOVERSION;  Surgeon: Mona Vinie BROCKS, MD;  Location: [facility] ENDOSCOPY;  Service: Cardiovascular;  Laterality: N/A;   CARDIOVERSION N/A 02/06/2022   Procedure: CARDIOVERSION;  Surgeon: Delford Maude BROCKS, MD;  Location: [facility] ENDOSCOPY;  Service: Cardiovascular;  Laterality: N/A;   CATARACT EXTRACTION W/ INTRAOCULAR LENS  IMPLANT, BILATERAL Bilateral    CATARACT EXTRACTION, BILATERAL  2012   COLONOSCOPY  06/30/2008   no repeats needed    COLONOSCOPY     had 3 or 4 in the past    CORONARY ARTERY BYPASS GRAFT  2007   CABG X4   CYST EXCISION PERINEAL  1980s   HAMMER TOE SURGERY Bilateral    ICD GENERATOR CHANGEOUT N/A 05/30/2023   Procedure: ICD GENERATOR CHANGEOUT;  Surgeon: [name], MD;  Location: [facility] INVASIVE CV LAB;  Service: Cardiovascular;  Laterality: N/A;   JOINT REPLACEMENT     KNEE ARTHROPLASTY  07/30/2011   Procedure: COMPUTER ASSISTED TOTAL KNEE ARTHROPLASTY;  Surgeon: Cordella Glendia Hutchinson;  Location: [facility] OR;  Service: Orthopedics;  Laterality: Left;  left total knee arthroplasty   MASTECTOMY SUBCUTANEOUS Bilateral    MULTIPLE TOOTH EXTRACTIONS     ORIF FINGER / THUMB FRACTURE Right ~ 1980   repair of thumb injury   PROSTATE BIOPSY     REPLACEMENT TOTAL KNEE BILATERAL Bilateral 2012   TEE WITHOUT CARDIOVERSION N/A 03/04/2017   Procedure: TRANSESOPHAGEAL ECHOCARDIOGRAM (TEE);  Surgeon: Cash Lonni JONETTA, MD;  Location: [facility] OR;  Service: Open Heart Surgery;  Laterality: N/A;   TOTAL KNEE REVISION Right 05/18/2019   Procedure: RIGHT PATELLA REVISION/REMOVAL;  Surgeon: Hutchinson Cordella Glendia, MD;  Location: [facility]  OR;  Service: Orthopedics;  Laterality: Right;   TRANSCATHETER AORTIC VALVE REPLACEMENT, TRANSFEMORAL N/A 03/04/2017   Procedure: TRANSCATHETER AORTIC VALVE REPLACEMENT, TRANSFEMORAL;  Surgeon: Cash Lonni JONETTA, MD;  Location: [facility] OR;  Service: Open Heart Surgery;  Laterality: N/A;    Social History  reports that he quit smoking about 62 years ago. His smoking use included cigarettes. He started smoking about 75 years ago. He has a 45.5 pack-year smoking history. He has never used smokeless tobacco. He reports that he does not currently use alcohol after a past usage of about 7.0 standard drinks of alcohol per week. He reports that he does not use drugs.  Allergies  Allergen Reactions   Peanut (Diagnostic) Anaphylaxis   Peanut-Containing Drug Products Anaphylaxis   Sulfa Antibiotics Anaphylaxis   Sulfonamide Derivatives Anaphylaxis   Eliquis  [Apixaban ] Other (See Comments)    Back/hip pain   Norvasc  [Amlodipine ] Swelling    *lower extremity swelling   Xarelto  [  Rivaroxaban ] Other (See Comments)    Back/hip pain   Zestril  [Lisinopril ] Cough   Lactose Other (See Comments)    Unknown reaction    Family History  Problem Relation Age of Onset   Heart attack Father 16   Allergic rhinitis Father    Asthma Father    Heart failure Mother 28   Uterine cancer Mother    Breast cancer Mother    Colon cancer Neg Hx    Esophageal cancer Neg Hx   Reviewed on admission  Prior to Admission medications   Medication Sig Start Date End Date Taking? Authorizing Provider  albuterol  (VENTOLIN  HFA) 108 (90 Base) MCG/ACT inhaler Inhale 2 puffs into the lungs every 4 (four) hours as needed for wheezing or shortness of breath. 05/06/23   [name], MD  allopurinol  (ZYLOPRIM ) 100 MG tablet Take 1 tablet (100 mg total) by mouth daily. 09/18/23   [name], MD  amiodarone  (PACERONE ) 200 MG tablet Take 1 tablet (200 mg total) by mouth daily. 01/14/24   [name], MD  aspirin  81 MG tablet  Take 1 tablet (81 mg total) by mouth daily. 05/19/19   Magnant, Charles L, PA-C  Baclofen 5 MG TABS Take 5 mg by mouth every 12 (twelve) hours as needed (Muscle Spasms).    [provider]  calcium  carbonate (OS-CAL) 1250 (500 Ca) MG chewable tablet Chew 1 tablet by mouth daily.    [provider]  Calcium -Magnesium -Zinc-Vit D3 (CALCIUM -MAGNESIUM -ZINC-D3 PO) Take 2 tablets by mouth daily.    [provider]  cetirizine (ZYRTEC) 10 MG tablet Take 10 mg by mouth daily as needed for allergies.    [provider]  colchicine  0.6 MG tablet Take 1 tablet (0.6 mg total) by mouth every 6 (six) hours as needed (gout). 09/18/23 09/17/24  [name], MD  dabigatran  (PRADAXA ) 150 MG CAPS capsule TAKE (1) CAPSULE TWICE DAILY. 10/24/23   [name], MD  diclofenac  Sodium (VOLTAREN ) 1 % GEL Apply 4 g topically 3 (three) times daily as needed (pain). 01/26/24   [name], MD  docusate sodium  (COLACE) 100 MG capsule Take 100 mg by mouth 2 (two) times daily.    [provider]  fluticasone  (FLONASE ) 50 MCG/ACT nasal spray Place 2 sprays into both nostrils daily. 12/26/23   [provider]  insulin  aspart (NOVOLOG ) 100 UNIT/ML injection Inject 4-16 Units into the skin as directed. Inject as per sliding scale: if 141 - 180 = 4 units Inject per sliding scale; 181 - 220 = 6 units Inject per sliding scale; 221 - 260 = 8 units Inject per sliding scale; 261 - 300 = 10 units Inject per sliding scale; 301 - 350 = 12 units Inject per sliding scale; 351 - 400= 14 units Inject per sliding scale; 401+ = 16 units Inject per sliding scale, subcutaneously three times a day    [provider]  isosorbide  mononitrate (IMDUR ) 30 MG 24 hr tablet Take 1 tablet (30 mg total) by mouth daily. 09/02/23 12/22/24  [name], MD  ketotifen  (ZADITOR ) 0.025 % ophthalmic solution Place 1 drop into both eyes daily as needed (for itching).    [provider]  levothyroxine  (SYNTHROID ) 175 MCG tablet Take 175 mcg by mouth daily.    [provider]  metoCLOPramide  (REGLAN ) 10 MG tablet Take 10 mg by mouth 2 (two) times daily.    [provider]  metolazone  (ZAROXOLYN ) 2.5 MG tablet Take 2.5 mg  by mouth See admin instructions. Sunday and Wednesday's. 07/16/24   [provider]  metolazone  (ZAROXOLYN ) 5 MG tablet Take 1 tablet (5 mg total) every Sunday and Wednesday with extra 40 of Potassium with each metolazone  doses. Patient taking differently: Take 2.5 mg by mouth daily. Take 1 tablet (5 mg total) every Sunday and Wednesday with extra 40 of Potassium with each metolazone  doses. 07/07/24   Glena [name], FNP  metoprolol  succinate (TOPROL -XL) 100 MG 24 hr tablet Take 100 mg by mouth daily. Take with or immediately following a meal.    [provider]  montelukast  (SINGULAIR ) 10 MG tablet TAKE ONE TABLET BY MOUTH ONCE DAILY IN THE EVENING 08/20/22   Hunsucker, Donnice SAUNDERS, MD  Multiple Vitamins-Minerals (PRESERVISION AREDS 2) CAPS Take 1 capsule by mouth in the morning and at bedtime.    [provider]  OXYGEN Inhale 2 L into the lungs as directed. Resident to wear O2 when ambulating around the unit and during therapy every shift    [provider]  pantoprazole  (PROTONIX ) 40 MG tablet Take 40 mg by mouth 2 (two) times daily.    [provider]  polyethylene glycol powder (GLYCOLAX/MIRALAX) 17 GM/SCOOP powder Take 17 g by mouth daily as needed for moderate constipation.    [provider]  potassium chloride  SA (KLOR-CON  M) 20 MEQ tablet Take 80 mEq by mouth 2 (two) times daily. Take additional 40 mEq with each Metolazone  dose. 07/28/24   [provider]  Probiotic Product (PROBIOTIC PO) Take 1 capsule by mouth daily.    [provider]  sacubitril-valsartan (ENTRESTO ) 24-26 MG Take 0.5 tablets by mouth 2 (two) times daily.    [provider]   spironolactone  (ALDACTONE ) 25 MG tablet Take 1 tablet (25 mg total) by mouth daily. 10/24/23   [name], MD  tamsulosin (FLOMAX) 0.4 MG CAPS capsule Take 0.4 mg by mouth daily.    [provider]  Tiotropium Bromide Monohydrate  (SPIRIVA  RESPIMAT) 2.5 MCG/ACT AERS Inhale 2 puffs into the lungs daily. 02/17/24   Hope Almarie ORN, NP  torsemide  (DEMADEX ) 20 MG tablet Take 4 tablets (80 mg total) by mouth in the morning AND 4 tablets (80 mg total) every evening. 07/15/24   Marcine Catalan M, PA-C  triamcinolone  cream (KENALOG ) 0.5 % Apply topically daily as needed for dry skin (affected areas). 04/15/17   [provider]  valACYclovir  (VALTREX ) 500 MG tablet Take 500 mg by mouth every 12 (twelve) hours as needed (cold sores).    [provider]  venlafaxine  XR (EFFEXOR  XR) 37.5 MG 24 hr capsule Take 1 capsule (37.5 mg total) by mouth daily with breakfast. 10/23/23   Mast, Man X, NP    Physical Exam: Vitals:   08/03/24 1147 08/03/24 1300 08/03/24 1303  BP: (!) 78/57 110/78 110/78  Pulse: 86 91 94  Resp: 17 17 15   Temp: 97.9 F (36.6 C)    TempSrc: Oral    SpO2: 96% 100% 100%    Physical Exam Constitutional:      General: He is not in acute distress.    Appearance: Normal appearance. He is obese.  HENT:     Head: Normocephalic and atraumatic.     Mouth/Throat:     Mouth: Mucous membranes are moist.     Pharynx: Oropharynx is clear.  Eyes:     Extraocular Movements: Extraocular movements intact.     Pupils: Pupils are equal, round, and reactive to light.  Cardiovascular:  Rate and Rhythm: Normal rate and regular rhythm.     Pulses: Normal pulses.     Heart sounds: Normal heart sounds.  Pulmonary:     Effort: Pulmonary effort is normal. No respiratory distress.     Breath sounds: Normal breath sounds.  Abdominal:     General: Bowel sounds are normal. There is no distension.     Palpations: Abdomen is soft.     Tenderness: There is no  abdominal tenderness.  Musculoskeletal:        General: No swelling or deformity.  Skin:    General: Skin is warm and dry.  Neurological:     General: No focal deficit present.     Mental Status: Mental status is at baseline.    Labs on Admission: I have personally reviewed following labs and imaging studies  CBC: Recent Labs  Lab 08/03/24 1200  WBC 11.8*  NEUTROABS 7.9*  HGB 11.6*  HCT 35.7*  MCV 88.8  PLT 272    Basic Metabolic Panel: Recent Labs  Lab 08/03/24 1200  NA 134*  K 4.1  CL 96*  CO2 27  GLUCOSE 100*  BUN 52*  CREATININE 2.56*  CALCIUM  8.1*    GFR: Estimated Creatinine Clearance: 22 mL/min (A) (by C-G formula based on SCr of 2.56 mg/dL (H)).  Liver Function Tests: Recent Labs  Lab 08/03/24 1200  AST 26  ALT 15  ALKPHOS 87  BILITOT 1.0  PROT 6.3*  ALBUMIN  2.3*    Urine analysis:    Component Value Date/Time   COLORURINE YELLOW 11/18/2023 0228   APPEARANCEUR CLEAR 11/18/2023 0228   LABSPEC 1.009 11/18/2023 0228   PHURINE 5.0 11/18/2023 0228   GLUCOSEU NEGATIVE 11/18/2023 0228   GLUCOSEU NEGATIVE 05/07/2023 1415   HGBUR NEGATIVE 11/18/2023 0228   HGBUR negative 09/05/2010 0810   BILIRUBINUR NEGATIVE 11/18/2023 0228   BILIRUBINUR NEG 03/20/2022 1121   KETONESUR NEGATIVE 11/18/2023 0228   PROTEINUR NEGATIVE 11/18/2023 0228   UROBILINOGEN 0.2 05/07/2023 1415   NITRITE POSITIVE (A) 11/18/2023 0228   LEUKOCYTESUR NEGATIVE 11/18/2023 0228    Radiological Exams on Admission: DG Chest Port 1 View Result Date: 08/03/2024 CLINICAL DATA:  Weakness. EXAM: PORTABLE CHEST 1 VIEW COMPARISON:  01/20/2024. FINDINGS: Cardiomegaly, unchanged. Status post TAVR, CABG, and median sternotomy. Stable left chest wall AICD. Pulmonary vascular congestion with possible mild interstitial edema. No focal consolidation, sizeable pleural effusion, or pneumothorax. No acute osseous abnormality. IMPRESSION: Cardiomegaly with pulmonary vascular congestion and possible  mild interstitial edema. Electronically Signed   By: Harrietta Sherry M.D.   On: 08/03/2024 12:55   EKG: Independently reviewed.  Atrial fibrillation versus paced rhythm at 81 beats minute.  Nonspecific T wave changes.  Right bundle branch block.  QTc prolonged at 543.  Assessment/Plan Principal Problem:   Acute renal failure superimposed on stage 3a chronic kidney disease (HCC) Active Problems:   History of transcatheter aortic valve replacement (TAVR)   Hypothyroidism   Chronic kidney disease, stage 3a (HCC)   Hyperlipidemia   GOUT   Essential hypertension   BPH with urinary obstruction   Coronary artery disease involving native coronary artery of native heart without angina pectoris   Prostate cancer (HCC)   S/P CABG x 4   S/P ICD (internal cardiac defibrillator) procedure   Gastroesophageal reflux disease   Biventricular implantable cardioverter-defibrillator (ICD) in situ   Persistent atrial fibrillation (HCC)   Recurrent depression   COPD (chronic obstructive pulmonary disease) (HCC)   Mild cognitive impairment  Hypotension   AKI on CKD 3A Hypotension > Presented from cardiology clinic with blood pressure in 60s systolic.  Improved to the 80s systolic after 500 cc by EMS and further improved to the 100s-110 systolic with additional 500 cc in the ED. > Creatinine elevated to 2.5 from baseline of 1.5. > Concern for possible overdiuresis as patient did have diuretic dose increased after being found to be volume overloaded at heart failure clinic follow-up visit on 11/20. > Received IV fluids as above, cardiology consulted in the ED and are following. - Continue with gentle IV fluids - Appreciate cardiology recommendations and assistance - Trend renal function and electrolytes ADDENDUM > Repeat low blood pressure in the 70s-80s systolic.  Patient has urinated over liter since he has been here. - Additional 500 cc IV fluid bolus - Started on a rate of 75 cc an hour - Close  monitoring  Chronic biventricular CHF > Last echo in March showed EF 40-45%, indeterminate diastolic function, moderately reduced RV function.  Large left pleural effusion noted at that time.  Patient is status post TAVR and has ICD/pacemaker in place. > Cardiology consulted in the ED - Appreciate cardiology recommendations and assistance - Holding torsemide , spironolactone , metolazone , Imdur , Entresto , metoprolol  in the setting of above - Restart medications as tolerated - Gentle IV fluids as above  Hypertension - Holding antihypertensives in the setting of hypotension and AKI as above  History of NSVT Atrial fibrillation > A-fib versus paced rhythm in the ED. - Continue home amiodarone  and Pradaxa   CAD > Status post CABG - Continue ASA, Pradaxa  - Holding Entresto , metoprolol  as above  Hypothyroidism > TSH normal in the ED.  T4 borderline elevated at 1.32. - Continue home Synthroid   Depression Mild cognitive impairment - Continue home venlafaxine   History of prostate cancer BPH - Holding tamsulosin for now  Gout - Holding allopurinol   COPD Asthma - Continue home Spiriva , albuterol , Singulair   DVT prophylaxis: Pradaxa  Code Status:   DNR/DNI Family Communication:  Updated at bedside Disposition Plan:   Patient is from:  Home  Anticipated DC to:  Home  Anticipated DC date:  2 to 4 days  Anticipated DC barriers: None  Consults called:  Cardiology Admission status:  Inpatient, progressive  Severity of Illness: The appropriate patient status for this patient is INPATIENT. Inpatient status is judged to be reasonable and necessary in order to provide the required intensity of service to ensure the patient's safety. The patient's presenting symptoms, physical exam findings, and initial radiographic and laboratory data in the context of their chronic comorbidities is felt to place them at high risk for further clinical deterioration. Furthermore, it is not anticipated  that the patient will be medically stable for discharge from the hospital within 2 midnights of admission.   * I certify that at the point of admission it is my clinical judgment that the patient will require inpatient hospital care spanning beyond 2 midnights from the point of admission due to high intensity of service, high risk for further deterioration and high frequency of surveillance required.Justin [name] Justin KATHEE Seena MD [facility]  How to contact the [facility] Attending or Consulting provider 7A - 7P or covering provider during after hours 7P -7A, for this patient?   Check the care team in [facility] and look for a) attending/consulting [facility] provider listed and b) the [facility] team listed Log into [email or web address] and use [facility] universal password to access. If you do not have the password, please contact  the hospital operator. Locate the [facility] provider you are looking for under [facility] and page to a number that you can be directly reached. If you still have difficulty reaching the provider, please page the [facility] (Director on Call) for the Hospitalists listed on amion for assistance.  08/03/2024, 2:26 PM

## 2024-08-03 NOTE — ED Triage Notes (Signed)
 Pt was BIB [facility] from the heart and vascular center from a a check up because he was Hypotensive at 62/44 Manually. He had 400 of NS which brought his pressure to 86/60 Manually. Patient has a demand pacer and a hx of afib.   96 HR AFIB 98% RA  86/60 BP  18 GA LFA

## 2024-08-03 NOTE — ED Notes (Addendum)
 Received call back from MD [facility]. Informed MD of patients low B/P's  and family's concern of patient being on NS affecting CHF.

## 2024-08-03 NOTE — ED Notes (Signed)
 Seena, MD, made aware of recent blood pressure readings via secure chat.

## 2024-08-03 NOTE — ED Notes (Signed)
 Requested US  to page cardiology for patient per MD [facility].

## 2024-08-04 LAB — CBC
HCT: 34.9 % — ABNORMAL LOW (ref 39.0–52.0)
Hemoglobin: 11.2 g/dL — ABNORMAL LOW (ref 13.0–17.0)
MCH: 28.3 pg (ref 26.0–34.0)
MCHC: 32.1 g/dL (ref 30.0–36.0)
MCV: 88.1 fL (ref 80.0–100.0)
Platelets: 273 K/uL (ref 150–400)
RBC: 3.96 MIL/uL — ABNORMAL LOW (ref 4.22–5.81)
RDW: 16.6 % — ABNORMAL HIGH (ref 11.5–15.5)
WBC: 12.3 K/uL — ABNORMAL HIGH (ref 4.0–10.5)
nRBC: 0 % (ref 0.0–0.2)

## 2024-08-04 LAB — COMPREHENSIVE METABOLIC PANEL WITH GFR
ALT: 15 U/L (ref 0–44)
AST: 25 U/L (ref 15–41)
Albumin: 2.2 g/dL — ABNORMAL LOW (ref 3.5–5.0)
Alkaline Phosphatase: 83 U/L (ref 38–126)
Anion gap: 11 (ref 5–15)
BUN: 54 mg/dL — ABNORMAL HIGH (ref 8–23)
CO2: 27 mmol/L (ref 22–32)
Calcium: 7.9 mg/dL — ABNORMAL LOW (ref 8.9–10.3)
Chloride: 98 mmol/L (ref 98–111)
Creatinine, Ser: 2.33 mg/dL — ABNORMAL HIGH (ref 0.61–1.24)
GFR, Estimated: 26 mL/min — ABNORMAL LOW (ref >60)
Glucose, Bld: 102 mg/dL — ABNORMAL HIGH (ref 70–99)
Potassium: 3.6 mmol/L (ref 3.5–5.1)
Sodium: 136 mmol/L (ref 135–145)
Total Bilirubin: 1 mg/dL (ref 0.0–1.2)
Total Protein: 6.2 g/dL — ABNORMAL LOW (ref 6.5–8.1)

## 2024-08-04 LAB — CBG MONITORING, ED: Glucose-Capillary: 118 mg/dL — ABNORMAL HIGH (ref 70–99)

## 2024-08-04 LAB — HEMOGLOBIN A1C
Hgb A1c MFr Bld: 6.2 % — ABNORMAL HIGH (ref 4.8–5.6)
Mean Plasma Glucose: 131.24 mg/dL

## 2024-08-04 LAB — GLUCOSE, CAPILLARY
Glucose-Capillary: 121 mg/dL — ABNORMAL HIGH (ref 70–99)
Glucose-Capillary: 108 mg/dL — ABNORMAL HIGH (ref 70–99)

## 2024-08-04 LAB — LACTIC ACID, PLASMA: Lactic Acid, Venous: 1.1 mmol/L (ref 0.5–1.9)

## 2024-08-04 MED ORDER — METOCLOPRAMIDE HCL 5 MG PO TABS
10.0000 mg | ORAL_TABLET | Freq: Two times a day (BID) | ORAL | Status: DC
  Administered 2024-08-04: 10 mg via ORAL
  Filled 2024-08-04 (×2): qty 2

## 2024-08-04 MED ORDER — FLUTICASONE PROPIONATE 50 MCG/ACT NA SUSP
2.0000 | Freq: Every day | NASAL | Status: DC
  Filled 2024-08-04: qty 16

## 2024-08-04 MED ORDER — INSULIN ASPART 100 UNIT/ML IJ SOLN: 0.0000 [IU] | Freq: Three times a day (TID) | INTRAMUSCULAR | Status: DC

## 2024-08-04 MED ORDER — ATORVASTATIN CALCIUM 10 MG PO TABS
20.0000 mg | ORAL_TABLET | Freq: Every day | ORAL | Status: DC
  Administered 2024-08-04 – 2024-08-05 (×2): 20 mg via ORAL
  Filled 2024-08-04 (×2): qty 2

## 2024-08-04 MED ORDER — PANTOPRAZOLE SODIUM 40 MG PO TBEC
40.0000 mg | DELAYED_RELEASE_TABLET | Freq: Two times a day (BID) | ORAL | Status: DC
  Administered 2024-08-04 – 2024-08-05 (×3): 40 mg via ORAL
  Filled 2024-08-04 (×3): qty 1

## 2024-08-04 MED ORDER — SPIRONOLACTONE 25 MG PO TABS
25.0000 mg | ORAL_TABLET | Freq: Every day | ORAL | Status: DC
  Administered 2024-08-04 – 2024-08-05 (×2): 25 mg via ORAL
  Filled 2024-08-04 (×2): qty 1

## 2024-08-04 MED ORDER — METOPROLOL SUCCINATE ER 25 MG PO TB24
25.0000 mg | ORAL_TABLET | Freq: Every day | ORAL | Status: DC
  Administered 2024-08-04: 25 mg via ORAL
  Filled 2024-08-04: qty 1

## 2024-08-04 MED ORDER — TIOTROPIUM BROMIDE 2.5 MCG/ACT IN AERS: 2.0000 | INHALATION_SPRAY | Freq: Every day | RESPIRATORY_TRACT | Status: DC

## 2024-08-04 MED ORDER — LORATADINE 10 MG PO TABS
10.0000 mg | ORAL_TABLET | Freq: Every day | ORAL | Status: DC
  Administered 2024-08-04 – 2024-08-05 (×2): 10 mg via ORAL
  Filled 2024-08-04 (×2): qty 1

## 2024-08-04 MED ORDER — DOCUSATE SODIUM 100 MG PO CAPS
100.0000 mg | ORAL_CAPSULE | Freq: Two times a day (BID) | ORAL | Status: DC
  Administered 2024-08-04 – 2024-08-05 (×3): 100 mg via ORAL
  Filled 2024-08-04 (×3): qty 1

## 2024-08-04 MED ORDER — UMECLIDINIUM BROMIDE 62.5 MCG/ACT IN AEPB
1.0000 | INHALATION_SPRAY | Freq: Every day | RESPIRATORY_TRACT | Status: DC
  Administered 2024-08-05: 1 via RESPIRATORY_TRACT
  Filled 2024-08-04: qty 7

## 2024-08-04 MED ORDER — ALLOPURINOL 100 MG PO TABS
100.0000 mg | ORAL_TABLET | Freq: Every day | ORAL | Status: DC
  Administered 2024-08-04 – 2024-08-05 (×2): 100 mg via ORAL
  Filled 2024-08-04 (×2): qty 1

## 2024-08-04 MED ORDER — VENLAFAXINE HCL ER 37.5 MG PO CP24
37.5000 mg | ORAL_CAPSULE | Freq: Every day | ORAL | Status: DC
  Administered 2024-08-05: 37.5 mg via ORAL
  Filled 2024-08-04 (×3): qty 1

## 2024-08-04 NOTE — ED Notes (Signed)
 Stop action deferred on normal saline drip for cardiology assessment per [facility] MD per face to face conversation.

## 2024-08-04 NOTE — Progress Notes (Signed)
 Advanced Heart Failure Rounding Note  Cardiologist: [name], MD  Chief Complaint: AKI Subjective:    Volume resuscitated overnight. Feeling well this morning. No shortness of breath.  Objective:   Weight Range:   There is no height or weight on file to calculate BMI.   Vital Signs:   Temp:  [97.6 F (36.4 C)-98.2 F (36.8 C)] 98.2 F (36.8 C) (12/10 0549) Pulse Rate:  [65-102] 84 (12/10 0700) Resp:  [12-26] 19 (12/10 0700) BP: (76-116)/(33-87) 106/65 (12/10 0700) SpO2:  [92 %-100 %] 95 % (12/10 0720) Last BM Date : 08/02/24  Weight change: There were no vitals filed for this visit.  Intake/Output:   Intake/Output Summary (Last 24 hours) at 08/04/2024 0934 Last data filed at 08/04/2024 9344 Gross per 24 hour  Intake 2307.81 ml  Output 400 ml  Net 1907.81 ml      Physical Exam    General: Elderly appearing. No distress  Cardiac: JVP flat. No murmurs  Extremities: Warm and dry.  No peripherla edema.  Neuro: A&O x3. Affect pleasant.   Telemetry   SR 90s w PVCs  Labs    CBC Recent Labs    08/03/24 1200 08/04/24 0246  WBC 11.8* 12.3*  NEUTROABS 7.9*  --   HGB 11.6* 11.2*  HCT 35.7* 34.9*  MCV 88.8 88.1  PLT 272 273   Basic Metabolic Panel Recent Labs    87/90/74 1200 08/04/24 0246  NA 134* 136  K 4.1 3.6  CL 96* 98  CO2 27 27  GLUCOSE 100* 102*  BUN 52* 54*  CREATININE 2.56* 2.33*  CALCIUM  8.1* 7.9*   Liver Function Tests Recent Labs    08/03/24 1200 08/04/24 0246  AST 26 25  ALT 15 15  ALKPHOS 87 83  BILITOT 1.0 1.0  PROT 6.3* 6.2*  ALBUMIN  2.3* 2.2*   BNP (last 3 results) Recent Labs    07/15/24 1227 07/21/24 1615 08/03/24 1200  BNP 653.6* 716.3* 674.3*   Medications:    Scheduled Medications:  allopurinol   100 mg Oral Daily   amiodarone   200 mg Oral Daily   aspirin  EC  81 mg Oral Daily   atorvastatin   20 mg Oral Daily   azithromycin   500 mg Oral Daily   dabigatran   75 mg Oral Q12H   docusate  sodium  100 mg Oral BID   fluticasone   2 spray Each Nare Daily   insulin  aspart  0-9 Units Subcutaneous TID WC   levothyroxine   175 mcg Oral Q0600   loratadine   10 mg Oral Daily   metoCLOPramide   10 mg Oral BID AC   montelukast   10 mg Oral QHS   pantoprazole   40 mg Oral BID   sodium chloride  flush  3 mL Intravenous Q12H   Tiotropium Bromide  2 puff Inhalation Daily   venlafaxine  XR  37.5 mg Oral Daily    Infusions:   PRN Medications: acetaminophen  **OR** acetaminophen , senna-docusate  Patient Profile   Justin [name] is a 89 year old with a history of permanent A fib, AS S/P TAVR, medtronic BiV ICD, CKD Stage IIIa, CAD, CABG 2007, and chronic HFrEF. Admitting with hypotension & AKI on CKD Stage IIIa.  Assessment/Plan   AKI on CKD Stage IIIa, Hypotension - prerenal due to over diuresis and hypotension  - baseline Cr ~1.3-1.5. This admit sCr 2.56>2.33  Chronic Biventricular HFrEF, ICM/NICM  - Echo 3/25: EF 40-45% RV moderately reduced  - Has CRT-D Medtronic. Optivol fluid index below threshold  with impedence - euvolemic on exam, stop IVF - restart spiro 25 mg daily - restart toprol  25 mg daily at bedtime - stop entresto  - add back diuretics tomorrow   Permanent Afib/Frequent PVCs  - Rate controlled - continue amio 200 mg daily. Hold Toprol  XL   - continue pradaxa  150 BID   CAD s/p CABG x4 2007 - patent grafts on cath 1/18 with residual disease throughout - continue statin/ASA   Hypothyroid: thyroid  labs ok - continue levothyrox   AS: s/p TAVR 2018   DNR    Length of Stay: 1  Justin Luckman, NP  08/04/2024, 9:34 AM  Advanced Heart Failure Team Pager [phone] (M-F; 7a - 5p)  Please contact [facility] for night-coverage after hours (5p -7a ) and weekends on [email or web address]

## 2024-08-04 NOTE — Progress Notes (Signed)
° °  Brief Progress Note   _____________________________________________________________________________________________________________  Patient Name: Justin [name] Patient DOB: 10-22-1933 Date: @TODAY @      Data: Reviewed vital signs, labs, and notes.    Action: No action required at this time. No progressive beds available at this time.    Response:    _____________________________________________________________________________________________________________  [facility] RN Expeditor Jacon Whetzel S Travoris Bushey Please contact us  directly via secure chat (search for [facility]) or by calling us  at [phone] [facility]).

## 2024-08-04 NOTE — Evaluation (Signed)
 Physical Therapy Evaluation Patient Details Name: TORIN MODICA MRN: [id number] DOB: [birth year] Today's Date: 08/04/2024  History of Present Illness  Pt is an 89 y.o. M presenting to [facility] on 08/03/24 following hypotensive episode at cardiology clinic. PMH is significant for HTN, HLD, GERD, CKD3, s/p TAVR, ICD, s/p CABG, and HF.  Clinical Impression  Prior to admittance, pt was mobilizing mod I utilizing rollator for mobility and occasionally utilizing power chair (?) for longer distances. Pt was able to ambulate with AD and no physical assistance given. Pt would benefit from further gait, LE strengthening, and balance training. PT will continue to treat pt while he is admitted. Recommending HHPT at discharge to address remaining mobility deficits and optimize return to PLOF.    BP supine: 89/59 BP seated: 88/61 BP standing: 105/67    If plan is discharge home, recommend the following: A little help with walking and/or transfers;Assist for transportation   Can travel by private vehicle        Equipment Recommendations None recommended by PT  Recommendations for Other Services       Functional Status Assessment Patient has had a recent decline in their functional status and demonstrates the ability to make significant improvements in function in a reasonable and predictable amount of time.     Precautions / Restrictions Precautions Precautions: Fall Recall of Precautions/Restrictions: Intact Restrictions Weight Bearing Restrictions Per Provider Order: No      Mobility  Bed Mobility Overal bed mobility: Needs Assistance Bed Mobility: Supine to Sit, Sit to Supine     Supine to sit: Mod assist, Used rails Sit to supine: Contact guard assist   General bed mobility comments: Pt requiring mod A for trunk for initial supine to sit with HOB elevated. Pt then completed sit to supine with HOB flat and no physical assistance needed. VC given for sequencing; increased time to  complete.    Transfers Overall transfer level: Needs assistance Equipment used: Rolling walker (2 wheels) Transfers: Sit to/from Stand Sit to Stand: Contact guard assist           General transfer comment: Pt completed sit to stand from EOB w/ RW and no physical assistance. VC given to push up from bed with BUE as pt was pulling from walker. Increased time and effort to complete.    Ambulation/Gait Ambulation/Gait assistance: Contact guard assist Gait Distance (Feet): 150 Feet Assistive device: Rolling walker (2 wheels) Gait Pattern/deviations: Step-through pattern, Decreased stride length, Trunk flexed Gait velocity: reduced Gait velocity interpretation: <1.8 ft/sec, indicate of risk for recurrent falls   General Gait Details: Pt ambulates with increased reliance on RW for stability, demonstrating reciprocal gait pattern and reduced stride length.  Stairs            Wheelchair Mobility     Tilt Bed    Modified Rankin (Stroke Patients Only)       Balance Overall balance assessment: Needs assistance Sitting-balance support: No upper extremity supported, Feet supported Sitting balance-Leahy Scale: Good Sitting balance - Comments: seated EOB, but does display posterior trunk throughout LE MMT. VC given to increase abdominal activation with pt having difficulty sustaining throughout Postural control: Posterior lean Standing balance support: Bilateral upper extremity supported, During functional activity, Reliant on assistive device for balance Standing balance-Leahy Scale: Poor Standing balance comment: reliant on external support                             Pertinent  Vitals/Pain Pain Assessment Pain Assessment: No/denies pain    Home Living Family/patient expects to be discharged to:: Other (Comment)                   Additional Comments: Pt is resident of [facility]. Pt reports occasionally using portable oxygen on 2L when ambulating  but he tends to leave it at home.    Prior Function Prior Level of Function : Needs assist       Physical Assist : ADLs (physical)   ADLs (physical): Bathing Mobility Comments: pt reports utilizing rollator for mobility when ambulating to dining hall and around his home. For longer distances, pt uses a power chair. ADLs Comments: Pt reports needing assistance with bathing     Extremity/Trunk Assessment   Upper Extremity Assessment Upper Extremity Assessment: Generalized weakness    Lower Extremity Assessment Lower Extremity Assessment: Generalized weakness (4+/5 throughout)    Cervical / Trunk Assessment Cervical / Trunk Assessment: Kyphotic  Communication   Communication Communication: No apparent difficulties    Cognition Arousal: Alert Behavior During Therapy: WFL for tasks assessed/performed   PT - Cognitive impairments: No apparent impairments                         Following commands: Intact       Cueing Cueing Techniques: Verbal cues     General Comments General comments (skin integrity, edema, etc.): supine BP: 89/59, seated BP: 88/61, standing BP 105/67; SpO2 97% following bout of ambulation with no reports of SOB    Exercises     Assessment/Plan    PT Assessment Patient needs continued PT services  PT Problem List Decreased strength;Decreased activity tolerance;Decreased balance;Decreased mobility;Decreased coordination;Decreased knowledge of use of DME;Cardiopulmonary status limiting activity;Pain       PT Treatment Interventions DME instruction;Gait training;Functional mobility training;Therapeutic activities;Therapeutic exercise;Balance training;Patient/family education;Wheelchair mobility training;Manual techniques    PT Goals (Current goals can be found in the Care Plan section)  Acute Rehab PT Goals Patient Stated Goal: to get stronger PT Goal Formulation: With patient/family Time For Goal Achievement: 08/18/24 Potential to  Achieve Goals: Good    Frequency Min 2X/week     Co-evaluation               AM-PAC PT 6 Clicks Mobility  Outcome Measure Help needed turning from your back to your side while in a flat bed without using bedrails?: A Little Help needed moving from lying on your back to sitting on the side of a flat bed without using bedrails?: A Lot Help needed moving to and from a bed to a chair (including a wheelchair)?: A Little Help needed standing up from a chair using your arms (e.g., wheelchair or bedside chair)?: A Little Help needed to walk in hospital room?: A Little Help needed climbing 3-5 steps with a railing? : Total 6 Click Score: 15    End of Session Equipment Utilized During Treatment: Gait belt Activity Tolerance: Patient tolerated treatment well Patient left: in bed;with call bell/phone within reach;with family/visitor present Nurse Communication: Mobility status PT Visit Diagnosis: Unsteadiness on feet (R26.81);Other abnormalities of gait and mobility (R26.89);Muscle weakness (generalized) (M62.81);Pain Pain - Right/Left: Right Pain - part of body: Knee    Time: 8482-8452 PT Time Calculation (min) (ACUTE ONLY): 30 min   Charges:   PT Evaluation $PT Eval Moderate Complexity: 1 Mod   PT General Charges $$ ACUTE PT VISIT: 1 Visit  Leontine Hilt DPT Acute Rehab Services [phone] Prefer contact via chat   Leontine KATHEE Hilt 08/04/2024, 4:24 PM

## 2024-08-04 NOTE — ED Notes (Signed)
Transport called for pt.

## 2024-08-04 NOTE — Progress Notes (Addendum)
 PROGRESS NOTE   Justin [name]  FMW:[id number]    DOB: October 10, 1933    DOA: 08/03/2024  PCP: Mast, Man X, NP   I have briefly reviewed patients previous medical records in [facility].   Brief Hospital Course:  89 y.o. male with medical history significant of hypertension, hyperlipidemia, GERD, NSVT, atrial fibrillation, CAD status post CABG, hypothyroidism, chronic biventricular heart failure, depression, mild cognitive impairment, prostate cancer, BPH, gout, COPD, asthma, CKD 3A, status post TAVR, ICD in place, seen in the cardiology clinic on morning of admission, noted to be hypotensive with SBP's in the 60s, MS gave IVF bolus 300-500 mL with improvement to SBP in the 80s, admitted for hypotension and slight worsening of his CKD.  Cardiology/advanced heart failure team consulting.   Assessment & Plan:   Hypotension Suspected due to overdiuresis for his CHF diagnosis. On morning of hospital admission, SBP in the 60s and cardiology office and directed to the ED. Diuretics and GDMT meds on hold (holding Imdur , metolazone , Toprol -XL, Entresto , Aldactone  and Demadex ) S/p 1 L IV fluid bolus on admission followed by maintenance fluids 75 mL/h with improvement in his hypotension.  SBP in the 100s.  Creatinine has returned to his recent baseline.  IV fluids have ended this morning. Monitor closely.  Stage IIIb CKD (may have been 3A in August 2025 but not now) Baseline creatinine recently in the 2.2-2.3 range.  Mildly increased on admission to 2.56 but this does not qualify for AKI diagnosis.  Post hydration, creatinine has improved and returned to baseline. Follow BMP closely  Chronic biventricular HFrEF ICM/NICM, CAD s/p CABG S/p CRT-D Medtronic TTE 10/2023: LVEF 40-45%. Currently clinically euvolemic or even on the slightly dry side.  Holding diuretics and GDMT.  Management per advanced heart failure team Continue aspirin  81 mg daily and Pradaxa   Permanent A-fib Frequent  PVCs Continue amiodarone  200 mg daily.  Noted some PVCs on telemetry along with V paced rhythm. Continue aspirin  and Pradaxa .  Essential hypertension See above under hypotension  Hypothyroidism Continue levothyroxine  TSH 1.969.  Free T4 mildly elevated at 1.32, recommend repeating full TFTs in 4 weeks.  Clinically euthyroid.  Aortic stenosis S/p TAVR 2018  Depression Mild cognitive impairment - Continue home venlafaxine    History of prostate cancer BPH - Resumed tamsulosin   Gout - Resume allopurinol     COPD Asthma OSA on CPAP - Continue home Spiriva , albuterol , Singulair  - Continue nightly CPAP.  As needed albuterol  nebs.  Type II DM A1c 6.8 in February, requested repeat Sensitive SSI.   There is no height or weight on file to calculate BMI.   DVT prophylaxis:   Currently on aspirin  and Pradaxa    Code Status: Limited: Do not attempt resuscitation (DNR) -DNR-LIMITED -Do Not Intubate/DNI :  Family Communication: None at bedside Disposition:  Status is: Inpatient Remains inpatient appropriate because: Ongoing soft blood pressure despite IV fluid resuscitation, tenuous renal function, will need close monitoring and interventions as needed     Consultants:   Advanced heart failure team.  Procedures:     Subjective:  Seen this morning while still in ED.  Reports that he feels better.  Weakness has significantly improved and states that it may be close to his baseline.  No dyspnea, chest pain or palpitations.  States that he is visually impaired from macular degeneration.  Objective:   Vitals:   08/04/24 0600 08/04/24 0630 08/04/24 0700 08/04/24 0720  BP: 116/61 111/61 106/65   Pulse: 98 75 84  Resp: 19 15 19    Temp:      TempSrc:      SpO2: 100% 93% 95% 95%    General exam: Elderly male, moderately built and morbidly obese lying propped up in bed getting ready to eat his breakfast. Respiratory system: Clear to auscultation. Respiratory effort  normal. Cardiovascular system: S1 & S2 heard, RRR. No JVD, murmurs, rubs, gallops or clicks. No pedal edema.  Telemetry personally reviewed at bedside: V paced rhythm with occasional intermittent PVCs. Gastrointestinal system: Abdomen is nondistended but protuberant, soft and nontender. No organomegaly or masses felt. Normal bowel sounds heard. Central nervous system: Alert and oriented. No focal neurological deficits. Extremities: Symmetric 5 x 5 power. Skin: No rashes, lesions or ulcers Psychiatry: Judgement and insight appear normal. Mood & affect appropriate.     Data Reviewed:   I have personally reviewed following labs and imaging studies   CBC: Recent Labs  Lab 08/03/24 1200 08/04/24 0246  WBC 11.8* 12.3*  NEUTROABS 7.9*  --   HGB 11.6* 11.2*  HCT 35.7* 34.9*  MCV 88.8 88.1  PLT 272 273    Basic Metabolic Panel: Recent Labs  Lab 08/03/24 1200 08/04/24 0246  NA 134* 136  K 4.1 3.6  CL 96* 98  CO2 27 27  GLUCOSE 100* 102*  BUN 52* 54*  CREATININE 2.56* 2.33*  CALCIUM  8.1* 7.9*    Liver Function Tests: Recent Labs  Lab 08/03/24 1200 08/04/24 0246  AST 26 25  ALT 15 15  ALKPHOS 87 83  BILITOT 1.0 1.0  PROT 6.3* 6.2*  ALBUMIN  2.3* 2.2*    CBG: No results for input(s): GLUCAP in the last 168 hours.  Microbiology Studies:  No results found for this or any previous visit (from the past 240 hours).  Radiology Studies:  DG Chest Port 1 View Result Date: 08/03/2024 CLINICAL DATA:  Weakness. EXAM: PORTABLE CHEST 1 VIEW COMPARISON:  01/20/2024. FINDINGS: Cardiomegaly, unchanged. Status post TAVR, CABG, and median sternotomy. Stable left chest wall AICD. Pulmonary vascular congestion with possible mild interstitial edema. No focal consolidation, sizeable pleural effusion, or pneumothorax. No acute osseous abnormality. IMPRESSION: Cardiomegaly with pulmonary vascular congestion and possible mild interstitial edema. Electronically Signed   By: Harrietta Sherry  M.D.   On: 08/03/2024 12:55    Scheduled Meds:    amiodarone   200 mg Oral Daily   aspirin  EC  81 mg Oral Daily   azithromycin   500 mg Oral Daily   dabigatran   75 mg Oral Q12H   levothyroxine   175 mcg Oral Q0600   montelukast   10 mg Oral QHS   sodium chloride  flush  3 mL Intravenous Q12H    Continuous Infusions:     LOS: 1 day     Trenda Mar, MD,  FACP, [facility], [facility], [facility]   [facility] [place]      To contact the attending provider between 7A-7P or the covering provider during after hours 7P-7A, please log into the web site [email or web address] and access using universal [place] password for that web site. If you do not have the password, please call the hospital operator.  08/04/2024, 9:17 AM

## 2024-08-05 ENCOUNTER — Other Ambulatory Visit (HOSPITAL_COMMUNITY): Payer: Self-pay

## 2024-08-05 DIAGNOSIS — I959 Hypotension, unspecified: Secondary | ICD-10-CM | POA: Diagnosis not present

## 2024-08-05 LAB — BASIC METABOLIC PANEL WITH GFR
Anion gap: 7 (ref 5–15)
BUN: 43 mg/dL — ABNORMAL HIGH (ref 8–23)
CO2: 29 mmol/L (ref 22–32)
Calcium: 8.2 mg/dL — ABNORMAL LOW (ref 8.9–10.3)
Chloride: 100 mmol/L (ref 98–111)
Creatinine, Ser: 1.96 mg/dL — ABNORMAL HIGH (ref 0.61–1.24)
GFR, Estimated: 32 mL/min — ABNORMAL LOW (ref >60)
Glucose, Bld: 120 mg/dL — ABNORMAL HIGH (ref 70–99)
Potassium: 3.4 mmol/L — ABNORMAL LOW (ref 3.5–5.1)
Sodium: 136 mmol/L (ref 135–145)

## 2024-08-05 LAB — CBC
HCT: 33.6 % — ABNORMAL LOW (ref 39.0–52.0)
Hemoglobin: 10.7 g/dL — ABNORMAL LOW (ref 13.0–17.0)
MCH: 27.9 pg (ref 26.0–34.0)
MCHC: 31.8 g/dL (ref 30.0–36.0)
MCV: 87.5 fL (ref 80.0–100.0)
Platelets: 264 K/uL (ref 150–400)
RBC: 3.84 MIL/uL — ABNORMAL LOW (ref 4.22–5.81)
RDW: 16.6 % — ABNORMAL HIGH (ref 11.5–15.5)
WBC: 11.9 K/uL — ABNORMAL HIGH (ref 4.0–10.5)
nRBC: 0 % (ref 0.0–0.2)

## 2024-08-05 LAB — GLUCOSE, CAPILLARY
Glucose-Capillary: 110 mg/dL — ABNORMAL HIGH (ref 70–99)
Glucose-Capillary: 95 mg/dL (ref 70–99)

## 2024-08-05 MED ORDER — POTASSIUM CHLORIDE CRYS ER 20 MEQ PO TBCR
40.0000 meq | EXTENDED_RELEASE_TABLET | Freq: Once | ORAL | Status: AC
  Administered 2024-08-05: 40 meq via ORAL

## 2024-08-05 MED ORDER — METOPROLOL SUCCINATE ER 50 MG PO TB24: 50.0000 mg | ORAL_TABLET | Freq: Every day | ORAL | Status: DC

## 2024-08-05 MED ORDER — TORSEMIDE 40 MG PO TABS
80.0000 mg | ORAL_TABLET | Freq: Every day | ORAL | 0 refills | Status: DC
  Filled 2024-08-05: qty 90, fill #0

## 2024-08-05 MED ORDER — METOPROLOL SUCCINATE ER 100 MG PO TB24
50.0000 mg | ORAL_TABLET | Freq: Every day | ORAL | 0 refills | Status: DC
  Filled 2024-08-05: qty 30, 60d supply, fill #0

## 2024-08-05 MED ORDER — TORSEMIDE 20 MG PO TABS
80.0000 mg | ORAL_TABLET | Freq: Every day | ORAL | Status: DC
  Administered 2024-08-05: 80 mg via ORAL
  Filled 2024-08-05: qty 4

## 2024-08-05 MED ORDER — TORSEMIDE 20 MG PO TABS
40.0000 mg | ORAL_TABLET | ORAL | 6 refills | Status: DC | PRN
  Filled 2024-08-05: qty 60, 30d supply, fill #0

## 2024-08-05 MED ORDER — SENNOSIDES-DOCUSATE SODIUM 8.6-50 MG PO TABS
1.0000 | ORAL_TABLET | Freq: Every evening | ORAL | 1 refills | Status: DC | PRN
  Filled 2024-08-05: qty 30, 30d supply, fill #0

## 2024-08-05 MED ORDER — DABIGATRAN ETEXILATE MESYLATE 150 MG PO CAPS
150.0000 mg | ORAL_CAPSULE | Freq: Two times a day (BID) | ORAL | Status: DC
  Filled 2024-08-05: qty 1

## 2024-08-05 MED ORDER — METOPROLOL SUCCINATE ER 100 MG PO TB24
100.0000 mg | ORAL_TABLET | Freq: Every day | ORAL | 6 refills | Status: DC
  Filled 2024-08-05: qty 0.5, 1d supply, fill #0

## 2024-08-05 MED ORDER — POTASSIUM CHLORIDE CRYS ER 20 MEQ PO TBCR
40.0000 meq | EXTENDED_RELEASE_TABLET | Freq: Two times a day (BID) | ORAL | 1 refills | Status: DC
  Filled 2024-08-05: qty 120, 30d supply, fill #0

## 2024-08-05 MED ORDER — POTASSIUM CHLORIDE CRYS ER 20 MEQ PO TBCR
40.0000 meq | EXTENDED_RELEASE_TABLET | Freq: Two times a day (BID) | ORAL | 6 refills | Status: DC
  Filled 2024-08-05 (×2): qty 120, 30d supply, fill #0

## 2024-08-05 MED ORDER — TORSEMIDE 20 MG PO TABS
ORAL_TABLET | ORAL | 6 refills | Status: DC
  Filled 2024-08-05: qty 150, 30d supply, fill #0

## 2024-08-05 NOTE — Progress Notes (Signed)
 Physical Therapy Treatment Patient Details Name: Justin [name] MRN: [id number] DOB: 06-28-1934 Today's Date: 08/05/2024   History of Present Illness Pt is an 89 y.o. M presenting to [facility] on 08/03/24 following hypotensive episode at cardiology clinic. PMH is significant for HTN, HLD, GERD, CKD3, s/p TAVR, ICD, s/p CABG, and HF.    PT Comments  Pt tolerated mobility progressions well, progressing distance ambulated with AD and no physical assistance, with no reports of SOB. Pt does require standing rest break following prolonged bout due to elevated HR. Pt would benefit from further gait training, and balance training. PT will continue to treat pt while he is admitted. Recommending HHPT at discharge to address remaining mobility deficits.    BP: 99/67   If plan is discharge home, recommend the following: A little help with walking and/or transfers;Assist for transportation   Can travel by private vehicle        Equipment Recommendations  None recommended by PT    Recommendations for Other Services       Precautions / Restrictions Precautions Precautions: Fall Recall of Precautions/Restrictions: Intact Precaution/Restrictions Comments: watch HR Restrictions Weight Bearing Restrictions Per Provider Order: No     Mobility  Bed Mobility               General bed mobility comments: pt received in recliner and returned to recliner    Transfers Overall transfer level: Needs assistance Equipment used: Rollator (4 wheels) Transfers: Sit to/from Stand Sit to Stand: Supervision           General transfer comment: Pt completed 2 STS with rollator and closeby supervision. VC given for rollator management. Increased time to complete.    Ambulation/Gait Ambulation/Gait assistance: Contact guard assist Gait Distance (Feet): 450 Feet Assistive device: Rollator (4 wheels) Gait Pattern/deviations: Step-through pattern, Decreased stride length, Trunk flexed Gait velocity:  reduced Gait velocity interpretation: <1.8 ft/sec, indicate of risk for recurrent falls   General Gait Details: Pt ambulates with increased reliance on RW for stability, demonstrating reciprocal gait pattern and reduced stride length. After ambulating ~225' pt's HR jumped to 144 bpm. Pt took standing rest break with cues for deep breathing and HR immediately jumped back down to 124 bpm.   Stairs             Wheelchair Mobility     Tilt Bed    Modified Rankin (Stroke Patients Only)       Balance Overall balance assessment: Needs assistance Sitting-balance support: No upper extremity supported, Feet supported Sitting balance-Leahy Scale: Good Sitting balance - Comments: seated edge of recliner   Standing balance support: Bilateral upper extremity supported, During functional activity, Reliant on assistive device for balance Standing balance-Leahy Scale: Poor Standing balance comment: reliant on external support                            Communication Communication Communication: No apparent difficulties  Cognition Arousal: Alert Behavior During Therapy: WFL for tasks assessed/performed   PT - Cognitive impairments: No apparent impairments                         Following commands: Intact      Cueing Cueing Techniques: Verbal cues  Exercises      General Comments General comments (skin integrity, edema, etc.): HR 117 at rest, jumped up to 144 following prolonged bout of ambulation but immediately jumped back down following standing rest break  with pt's HR remaining at 124 bpm for the remainder of the session      Pertinent Vitals/Pain Pain Assessment Pain Assessment: No/denies pain    Home Living                          Prior Function            PT Goals (current goals can now be found in the care plan section) Acute Rehab PT Goals Patient Stated Goal: to get stronger PT Goal Formulation: With patient/family Time For  Goal Achievement: 08/18/24 Potential to Achieve Goals: Good Progress towards PT goals: Progressing toward goals    Frequency    Min 2X/week      PT Plan      Co-evaluation              AM-PAC PT 6 Clicks Mobility   Outcome Measure  Help needed turning from your back to your side while in a flat bed without using bedrails?: A Little Help needed moving from lying on your back to sitting on the side of a flat bed without using bedrails?: A Little Help needed moving to and from a bed to a chair (including a wheelchair)?: A Little Help needed standing up from a chair using your arms (e.g., wheelchair or bedside chair)?: A Little Help needed to walk in hospital room?: A Little Help needed climbing 3-5 steps with a railing? : Total 6 Click Score: 16    End of Session Equipment Utilized During Treatment: Gait belt Activity Tolerance: Patient tolerated treatment well Patient left: in chair;with call bell/phone within reach;Other (comment) (with MD Odelia)) Nurse Communication: Mobility status PT Visit Diagnosis: Unsteadiness on feet (R26.81);Other abnormalities of gait and mobility (R26.89);Muscle weakness (generalized) (M62.81);Pain Pain - Right/Left: Right Pain - part of body: Knee     Time: 9040-8984 PT Time Calculation (min) (ACUTE ONLY): 16 min  Charges:    $Gait Training: 8-22 mins PT General Charges $$ ACUTE PT VISIT: 1 Visit                     Leontine Hilt DPT Acute Rehab Services [phone] Prefer contact via chat    Leontine NOVAK Mattye Verdone 08/05/2024, 11:21 AM

## 2024-08-05 NOTE — Progress Notes (Signed)
 Reviewed AVS, patient expressed understanding of medications, MD follow up reviewed.   Removed IV, Site clean, dry and intact.  See LDA for information on wounds at discharge. Patient states all belongings brought to the hospital at time of admission are accounted for and packed to take home.  Picked up medications from [facility] pharmacy. Nursing staff contacted to transport patient to entrance A, where family member was waiting in vehicle to transport home.

## 2024-08-05 NOTE — Evaluation (Signed)
 Occupational Therapy Evaluation and Discharge Patient Details Name: Justin [name] MRN: [id number] DOB: [birth year] Today's Date: 08/05/2024   History of Present Illness   Pt is an 89 y.o. M presenting to [facility] on 08/03/24 following hypotensive episode at cardiology clinic. PMH is significant for HTN, HLD, GERD, CKD3, s/p TAVR, ICD, s/p CABG, and HF.     Clinical Impressions Pt is a resident of [facility]. He walks with a rollator or uses a motorized w/c for mobility at baseline and is assisted for showering, LB dressing and all IADLs. Pt with macular degeneration. Ambulated in room with supervision and RW. Pt is at his baseline in ADLs. No further OT needs.      If plan is discharge home, recommend the following:   A little help with walking and/or transfers;A lot of help with bathing/dressing/bathroom;Assistance with cooking/housework;Direct supervision/assist for medications management;Direct supervision/assist for financial management;Assist for transportation     Functional Status Assessment   Patient has not had a recent decline in their functional status     Equipment Recommendations   None recommended by OT     Recommendations for Other Services         Precautions/Restrictions   Precautions Precautions: Fall Recall of Precautions/Restrictions: Intact Precaution/Restrictions Comments: watch HR Restrictions Weight Bearing Restrictions Per Provider Order: No     Mobility Bed Mobility               General bed mobility comments: in chair    Transfers Overall transfer level: Needs assistance Equipment used: Rolling walker (2 wheels) Transfers: Sit to/from Stand Sit to Stand: Supervision           General transfer comment: increased time, no physical assist      Balance Overall balance assessment: Needs assistance   Sitting balance-Leahy Scale: Good     Standing balance support: Bilateral upper extremity supported, During  functional activity, Reliant on assistive device for balance Standing balance-Leahy Scale: Poor                             ADL either performed or assessed with clinical judgement   ADL Overall ADL's : At baseline                                             Vision Baseline Vision/History: 6 Macular Degeneration Ability to See in Adequate Light: 3 Highly impaired Patient Visual Report: No change from baseline       Perception         Praxis         Pertinent Vitals/Pain Pain Assessment Pain Assessment: No/denies pain     Extremity/Trunk Assessment Upper Extremity Assessment Upper Extremity Assessment: Overall WFL for tasks assessed;Right hand dominant   Lower Extremity Assessment Lower Extremity Assessment: Defer to PT evaluation   Cervical / Trunk Assessment Cervical / Trunk Assessment: Kyphotic;Other exceptions (obesity)   Communication Communication Communication: No apparent difficulties   Cognition Arousal: Alert Behavior During Therapy: WFL for tasks assessed/performed Cognition: No apparent impairments                               Following commands: Intact       Cueing  General Comments   Cueing Techniques: Verbal cues  HR 117 at  rest, jumped up to 144 following prolonged bout of ambulation but immediately jumped back down following standing rest break with pt's HR remaining at 124 bpm for the remainder of the session   Exercises     Shoulder Instructions      Home Living Family/patient expects to be discharged to:: Other (Comment) ([facility])                             Home Equipment: Rollator (4 wheels);Shower seat;Grab bars - toilet;Grab bars - tub/shower;Hand held shower head   Additional Comments: Pt is resident of [facility]. Pt reports occasionally using portable oxygen on 2L when ambulating but he tends to leave it at home.      Prior Functioning/Environment Prior  Level of Function : Needs assist             Mobility Comments: pt reports utilizing rollator for mobility when ambulating to dining hall and around his home. For longer distances, pt uses a power chair. ADLs Comments: Pt reports needing assistance with showering, LB dressing and all IADLs.    OT Problem List:     OT Treatment/Interventions:        OT Goals(Current goals can be found in the care plan section)       OT Frequency:       Co-evaluation              AM-PAC OT 6 Clicks Daily Activity     Outcome Measure Help from another person eating meals?: None Help from another person taking care of personal grooming?: A Little Help from another person toileting, which includes using toliet, bedpan, or urinal?: A Little Help from another person bathing (including washing, rinsing, drying)?: A Lot Help from another person to put on and taking off regular upper body clothing?: A Little Help from another person to put on and taking off regular lower body clothing?: A Lot 6 Click Score: 17   End of Session Equipment Utilized During Treatment: Gait belt;Rolling walker (2 wheels)  Activity Tolerance: Patient tolerated treatment well Patient left: in chair;with call bell/phone within reach  OT Visit Diagnosis: Unsteadiness on feet (R26.81);Low vision, both eyes (H54.2);Other (comment) (decreased activity tolerance)                Time: 1211-1229 OT Time Calculation (min): 18 min Charges:  OT General Charges $OT Visit: 1 Visit OT Evaluation $OT Eval Moderate Complexity: 1 Mod  [name], OTR/L Acute Rehabilitation Services Office: [phone]   Kennth Mliss Helling 08/05/2024, 1:14 PM

## 2024-08-05 NOTE — Progress Notes (Signed)
 REDS Clip  READING (normal 20-35%) = 33  CHEST RULER (in) =D Clip Station = 37   Patient tolerated well. Jordan Lee, NP with Advanced Heart Failure Team was updated on the results.   Stephane Haddock, BSN, Scientist, Clinical (histocompatibility And Immunogenetics) Only

## 2024-08-05 NOTE — TOC Initial Note (Signed)
 Transition of Care [facility]) - Initial/Assessment Note    Patient Details  Name: Justin [name] MRN: [id number] Date of Birth: 08-26-1934  Transition of [facility] CM/SW Contact:    Justina Delcia Czar, RN Phone Number: [phone] 08/05/2024, 11:59 AM  Clinical Narrative:                 HF CM spoke to [facility] coordinator, [place]. Requested dc summary fax to [phone]. States they have PT with [facility] and their RN will see patient when he returns to ALF.   Waiting dc summary, will fax when available. Scheduled dc today.   Expected Discharge Plan: Home w Home Health Services Barriers to Discharge: No Barriers Identified   Patient Goals and CMS Choice            Expected Discharge Plan and Services In-house Referral: Clinical Social Work Discharge Planning Services: CM Consult   Living arrangements for the past 2 months: Assisted Living Facility                                      Prior Living Arrangements/Services Living arrangements for the past 2 months: Assisted Living Facility Lives with:: Spouse   Do you feel safe going back to the place where you live?: Yes      Need for Family Participation in Patient Care: Yes (Comment) Care giver support system in place?: Yes (comment)      Activities of Daily Living   ADL Screening (condition at time of admission) Independently performs ADLs?: Yes (appropriate for developmental age) Is the patient deaf or have difficulty hearing?: No Does the patient have difficulty seeing, even when wearing glasses/contacts?: Yes Does the patient have difficulty concentrating, remembering, or making decisions?: No  Permission Sought/Granted Permission sought to share information with : Case Manager, Family Supports, PCP Permission granted to share information with : Yes, Verbal Permission Granted  Share Information with NAME: Avry Roedl  Permission granted to share info w AGENCY: Home Health, DME,  PCP  Permission granted to share info w Relationship: daughter  Permission granted to share info w Contact Information: [phone]  Emotional Assessment              Admission diagnosis:  Weakness [R53.1] AKI (acute kidney injury) [N17.9] Hypotension, unspecified hypotension type [I95.9] Patient Active Problem List   Diagnosis Date Noted   Hypotension 08/03/2024   Acute renal failure superimposed on stage 3a chronic kidney disease (HCC) 08/03/2024   Mild cognitive impairment 08/02/2024   Dry skin dermatitis 07/30/2024   COPD (chronic obstructive pulmonary disease) (HCC) 05/27/2024   Hyponatremia 02/16/2024   UTI (urinary tract infection) 12/19/2023   Recurrent depression 12/19/2023   Candidiasis 12/15/2023   Pleural effusion 11/20/2023   Asymptomatic bacteriuria 11/20/2023   Bronchitis 11/06/2023   Type 2 diabetes mellitus (HCC) 10/23/2023   Leukocytosis 10/16/2023   Right ventricular dysfunction 07/05/2023   Chronic kidney disease, stage 3a (HCC) 07/05/2023   Persistent atrial fibrillation (HCC) 07/05/2023   Long term (current) use of anticoagulants 07/05/2023   NSVT (nonsustained ventricular tachycardia) (HCC) 05/23/2022   Sinus node dysfunction (HCC) 05/23/2022   Frequent PVCs 05/23/2022   Biventricular implantable cardioverter-defibrillator (ICD) in situ 03/12/2022   Atypical atrial flutter (HCC) 01/30/2022   Secondary hypercoagulable state 04/19/2021   Raynaud's phenomenon without gangrene 07/11/2020   Vertigo 07/11/2020   Loosening of knee joint prosthesis 05/18/2019  Cough 04/30/2019   Gastroesophageal reflux disease 04/30/2019   S/P ICD (internal cardiac defibrillator) procedure 05/03/2017   NICM (nonischemic cardiomyopathy) (HCC) 05/02/2017   History of transcatheter aortic valve replacement (TAVR) 03/04/2017   Severe aortic valve stenosis 03/04/2017   Hypokalemia due to loss of potassium with diuresis 01/10/2017   Aortic stenosis 01/10/2017   Chronic  systolic CHF (congestive heart failure) (HCC) 01/10/2017   Chronic congestive heart failure (HCC) 01/07/2017   Hypothyroidism 12/23/2016   Prostate cancer (HCC)    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   Edema of both legs 09/01/2014   Asthma, intermittent 09/01/2014   Epiphora due to insufficient drainage 07/02/2011   Rosacea blepharoconjunctivitis 07/02/2011   Coronary artery disease involving native coronary artery of native heart without angina pectoris    GOUT 09/12/2009   CAD, AUTOLOGOUS BYPASS GRAFT 05/10/2009   MURMUR 05/08/2009   BRUIT 05/08/2009   MUSCLE CRAMPS 12/12/2008   KNEE SPRAIN 09/21/2008   Osteoarthritis 07/07/2008   BPH with urinary obstruction 02/01/2008   LOW BACK PAIN SYNDROME 02/01/2008   TESTOSTERONE DEFICIENCY 07/03/2007   Hyperlipidemia 07/03/2007   Essential hypertension 07/03/2007   MYOCARDIAL INFARCTION, HX OF 07/03/2007   Allergic rhinitis 07/03/2007   History of colonic polyps 07/03/2007   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   PCP:  Mast, Man X, NP Pharmacy:   [facility] [place], [place] - [address] [place] [address] Phone: [phone] Fax: [phone]     Social Drivers of Health (SDOH) Social History: SDOH Screenings   Food Insecurity: No Food Insecurity (11/18/2023)  Housing: Low Risk (11/18/2023)  Transportation Needs: No Transportation Needs (11/18/2023)  Utilities: Not At Risk (11/18/2023)  Alcohol Screen: Low Risk (03/31/2023)  Depression (PHQ2-9): Low Risk (04/05/2024)  Financial Resource Strain: Low Risk (03/31/2023)  Physical Activity: Sufficiently Active (03/31/2023)  Social Connections: Socially Integrated (11/18/2023)  Stress: No Stress Concern Present (03/31/2023)  Tobacco Use: Medium Risk (08/03/2024)  Health Literacy: Adequate Health Literacy (03/31/2023)   SDOH Interventions:     Readmission Risk Interventions    08/05/2024   11:56 AM 11/19/2023    1:00 PM  Readmission Risk  Prevention Plan  Transportation Screening Complete Complete  PCP or Specialist Appt within 3-5 Days  Complete  HRI or Home Care Consult  Complete  Social Work Consult for Recovery Care Planning/Counseling  Complete  Palliative Care Screening  Not Applicable  Medication Review Oceanographer) Complete Complete  PCP or Specialist appointment within 3-5 days of discharge Complete   HRI or Home Care Consult Complete   SW Recovery Care/Counseling Consult Complete   Palliative Care Screening Not Applicable   Skilled Nursing Facility Not Applicable

## 2024-08-05 NOTE — Discharge Instructions (Signed)

## 2024-08-05 NOTE — Plan of Care (Signed)

## 2024-08-05 NOTE — Discharge Summary (Signed)
 Physician Discharge Summary  Justin [name] FMW:[id number] DOB: 1934/07/13  PCP: Mast, Man X, NP  Admitted from: [facility] Discharged to: [facility]  Admit date: 08/03/2024 Discharge date: 08/05/2024  Recommendations for Outpatient Follow-up:    Follow-up Information     [facility] Follow up on 08/13/2024.   Specialty: Cardiology Why: at 12:00 pm Contact information: [address] [place]  [phone]        Mast, Man X, NP. Schedule an appointment as soon as possible for a visit in 1 week(s).   Specialty: Internal Medicine Why: To be seen with repeat labs (CBC & BMP). Contact information: [address] [place] [address] [phone]                  Home Health: Home Health Orders (From admission, onward)     Start     Ordered   08/05/24 1147  Home Health  At discharge       Question Answer Comment  To provide the following care/treatments PT   To provide the following care/treatments RN      08/05/24 1146             Equipment/Devices: None.  Patient has all DME per TOC including walker, electric scooter, CPAP and nightly oxygen.    Discharge Condition: Improved and stable.   Code Status: Limited: Do not attempt resuscitation (DNR) -DNR-LIMITED -Do Not Intubate/DNI  Diet recommendation:  Discharge Diet Orders (From admission, onward)     Start     Ordered   08/05/24 0000  Diet - low sodium heart healthy        08/05/24 1304   08/05/24 0000  Diet Carb Modified        08/05/24 1304             Discharge Diagnoses:  Principal Problem:   Acute renal failure superimposed on stage 3a chronic kidney disease (HCC) Active Problems:   History of transcatheter aortic valve replacement (TAVR)   Hypothyroidism   Chronic kidney disease, stage 3a (HCC)   Hyperlipidemia   GOUT   Essential hypertension   BPH with urinary obstruction   Coronary  artery disease involving native coronary artery of native heart without angina pectoris   Prostate cancer (HCC)   S/P CABG x 4   S/P ICD (internal cardiac defibrillator) procedure   Gastroesophageal reflux disease   Biventricular implantable cardioverter-defibrillator (ICD) in situ   Persistent atrial fibrillation (HCC)   Recurrent depression   COPD (chronic obstructive pulmonary disease) (HCC)   Mild cognitive impairment   Hypotension   Brief Hospital Course:  89 y.o. male with medical history significant of hypertension, hyperlipidemia, GERD, NSVT, atrial fibrillation, CAD status post CABG, hypothyroidism, chronic biventricular heart failure, depression, mild cognitive impairment, prostate cancer, BPH, gout, COPD, asthma, CKD 3A, status post TAVR, ICD in place, seen in the cardiology clinic on morning of admission, noted to be hypotensive with SBP's in the 60s, MS gave IVF bolus 300-500 mL with improvement to SBP in the 80s, admitted for hypotension and slight worsening of his CKD.  Cardiology/advanced heart failure team consulted.     Assessment & Plan:    Hypotension Suspected due to overdiuresis for his CHF diagnosis. On morning of hospital admission, SBP in the 60s in cardiology office and directed to the ED. Diuretics and GDMT were briefly held.  Treated with a liter IV fluid bolus and brief maintenance  IV fluids at 75 mL/h with improvement. As hypotension improved, cardiac meds were gradually initiated by the cardiology team.  Blood pressures have now normalized.  Imdur , metolazone  and Entresto  indefinitely discontinued.  See list below for discharge meds.   Stage IIIb CKD (may have been 3A in August 2025 but not now) Baseline creatinine recently in the 2.2-2.3 range.  Mildly increased on admission to 2.56 but this does not qualify for AKI diagnosis.  Post hydration, creatinine has improved and returned to baseline, 2.33 > 1.96. Follow BMP closely as outpatient   Chronic  biventricular HFrEF ICM/NICM, CAD s/p CABG S/p CRT-D Medtronic TTE 10/2023: LVEF 40-45%. Diuretics and GDMT were briefly held and has has hypotension and volume status improved, cardiology have reinitiated his cardiac medications and made adjustments as noted below.  Management per advanced heart failure team.  They have seen patient today and cleared patient for DC home and have arranged close outpatient follow-up.  HF Team Medication Recommendations for Home: - amio 200 mg daily - aspirin  81 mg daily - atorvastain 20 mg daily - pradaxa  150 mg bid - synthroid  175 mcg daily - toprol  50 mg daily at bedtime - spiro 25 mg daily - torsemide  80 mg daily - potassium 40 mg bid As confirmed with Jordan Lee, NP, they also recommend torsemide  40 mg as needed as noted below.   Permanent A-fib Frequent PVCs Continue amiodarone  200 mg daily.  Noted some PVCs on telemetry along with V paced rhythm. Continue aspirin  Pradaxa  and Toprol -XL.   Essential hypertension See above under hypotension.   Hypothyroidism Continue levothyroxine  TSH 1.969.  Free T4 mildly elevated at 1.32, recommend repeating full TFTs in 4 weeks.  Clinically euthyroid.   Aortic stenosis S/p TAVR 2018   Depression Mild cognitive impairment - Continue home venlafaxine    History of prostate cancer BPH - Continue tamsulosin   Gout - Continue allopurinol     COPD Asthma OSA on CPAP - Continue home Spiriva , albuterol , Singulair  - Continue nightly CPAP and nightly oxygen at 2 L/min via [place].    Type II DM A1c 6.8 in February, repeat A1c 6.2 indicating very tight control in this very elderly male and at risk for complications.  Patient did not require any SSI while hospitalized with good inpatient CBG controlled.  At DC, discontinued SSI but continue to monitor CBGs closely and consider resuming meds as deemed necessary.  Hypokalemia Replaced prior to discharge.  P.o. supplements as outpatient adjusted.  Follow BMP  closely as outpatient.   There is no height or weight on file to calculate BMI.      Consultants:   Advanced heart failure team.   Procedures:       Discharge Instructions  Discharge Instructions     (HEART FAILURE PATIENTS) Call MD:  Anytime you have any of the following symptoms: 1) 3 pound weight gain in 24 hours or 5 pounds in 1 week 2) shortness of breath, with or without a dry hacking cough 3) swelling in the hands, feet or stomach 4) if you have to sleep on extra pillows at night in order to breathe.   Complete by: As directed    Call MD for:  difficulty breathing, headache or visual disturbances   Complete by: As directed    Call MD for:  extreme fatigue   Complete by: As directed    Call MD for:  persistant dizziness or light-headedness   Complete by: As directed    Call MD for:  persistant nausea  and vomiting   Complete by: As directed    Call MD for:  severe uncontrolled pain   Complete by: As directed    Call MD for:  temperature >100.4   Complete by: As directed    Diet - low sodium heart healthy   Complete by: As directed    Diet Carb Modified   Complete by: As directed    Discharge instructions   Complete by: As directed    Take BP every morning 1 hour after morning medications. Notify daughter  if SBP < 70. Take daily weights; refer to PRN torsemide  order for weight gain instruction. Notify HF clinic [phone]) for weight gain of >10lb total from 263 lbs.   Discharge instructions   Complete by: As directed    Continue to check fingerstick CBGs 3 times daily AC and at bedtime and contact MD if CBGs consistently >180.   Increase activity slowly   Complete by: As directed         Medication List     STOP taking these medications    Entresto  24-26 MG Generic drug: sacubitril-valsartan   insulin  aspart 100 UNIT/ML injection Commonly known as: novoLOG    isosorbide  mononitrate 30 MG 24 hr tablet Commonly known as: IMDUR    metolazone  2.5 MG  tablet Commonly known as: ZAROXOLYN    metolazone  5 MG tablet Commonly known as: ZAROXOLYN    PROBIOTIC PO   triamcinolone  cream 0.5 % Commonly known as: KENALOG        TAKE these medications    albuterol  108 (90 Base) MCG/ACT inhaler Commonly known as: VENTOLIN  HFA Inhale 2 puffs into the lungs every 4 (four) hours as needed for wheezing or shortness of breath.   allopurinol  100 MG tablet Commonly known as: ZYLOPRIM  Take 1 tablet (100 mg total) by mouth daily.   amiodarone  200 MG tablet Commonly known as: PACERONE  Take 1 tablet (200 mg total) by mouth daily.   aspirin  81 MG chewable tablet Chew 81 mg by mouth daily.   atorvastatin  20 MG tablet Commonly known as: LIPITOR Take 20 mg by mouth daily.   cetirizine 10 MG tablet Commonly known as: ZYRTEC Take 5 mg by mouth daily.   dabigatran  150 MG Caps capsule Commonly known as: Pradaxa  TAKE (1) CAPSULE TWICE DAILY.   diclofenac  Sodium 1 % Gel Commonly known as: Voltaren  Apply 4 g topically 3 (three) times daily as needed (pain). What changed:  how much to take when to take this additional instructions   docusate sodium  100 MG capsule Commonly known as: COLACE Take 100 mg by mouth in the morning and at bedtime.   fluticasone  50 MCG/ACT nasal spray Commonly known as: FLONASE  Place 2 sprays into both nostrils daily.   levothyroxine  175 MCG tablet Commonly known as: SYNTHROID  Take 175 mcg by mouth daily before breakfast.   metoCLOPramide  10 MG tablet Commonly known as: REGLAN  Take 10 mg by mouth 2 (two) times daily before a meal.   metoprolol  succinate 100 MG 24 hr tablet Commonly known as: TOPROL -XL Take 0.5 tablets (50 mg total) by mouth at bedtime. What changed:  how much to take when to take this   montelukast  10 MG tablet Commonly known as: SINGULAIR  TAKE ONE TABLET BY MOUTH ONCE DAILY IN THE EVENING What changed:  how much to take how to take this when to take this additional instructions    Multivitamin Gummies Mens Chew Chew 2 each by mouth daily. What changed: Another medication with the same name was removed. Continue  taking this medication, and follow the directions you see here.   oxyCODONE  5 MG immediate release tablet Commonly known as: Oxy IR/ROXICODONE  Take 5 mg by mouth every 8 (eight) hours as needed (knee pain).   OXYGEN Inhale 2 L/min into the lungs continuous.   pantoprazole  40 MG tablet Commonly known as: PROTONIX  Take 40 mg by mouth in the morning and at bedtime.   potassium chloride  SA 20 MEQ tablet Commonly known as: KLOR-CON  M Take 2 tablets (40 mEq total) by mouth 2 (two) times daily. What changed:  how much to take when to take this additional instructions   Spiriva  Respimat 2.5 MCG/ACT Aers Generic drug: Tiotropium Bromide Inhale 2 puffs into the lungs daily.   spironolactone  25 MG tablet Commonly known as: ALDACTONE  Take 1 tablet (25 mg total) by mouth daily. What changed:  when to take this additional instructions   tamsulosin 0.4 MG Caps capsule Commonly known as: FLOMAX Take 0.4 mg by mouth at bedtime.   torsemide  20 MG tablet Commonly known as: DEMADEX  Take 4 tablets (80 mg) by mouth every morning and in addition, take 2 tablets (40 mg total) by mouth as needed  (at 7pm) if has a weight gain of 3 lbs in 1 day or 5 lbs in a week. Check weight in the morning daily. What changed: See the new instructions.   UNABLE TO FIND Inhale 1 Device into the lungs at bedtime. CPAP; Apply CPAP mask at bedtime.   venlafaxine  XR 37.5 MG 24 hr capsule Commonly known as: EFFEXOR -XR Take 37.5 mg by mouth daily.       Allergies[1]    Procedures/Studies: DG Chest Port 1 View Result Date: 08/03/2024 CLINICAL DATA:  Weakness. EXAM: PORTABLE CHEST 1 VIEW COMPARISON:  01/20/2024. FINDINGS: Cardiomegaly, unchanged. Status post TAVR, CABG, and median sternotomy. Stable left chest wall AICD. Pulmonary vascular congestion with possible mild  interstitial edema. No focal consolidation, sizeable pleural effusion, or pneumothorax. No acute osseous abnormality. IMPRESSION: Cardiomegaly with pulmonary vascular congestion and possible mild interstitial edema. Electronically Signed   By: Harrietta Sherry M.D.   On: 08/03/2024 12:55      Subjective: Seen this morning.  Denies complaints.  Reports that he is back to his baseline strength.  No dyspnea, chest pain, palpitations, dizziness or lightheadedness reported.  Discharge Exam:  Vitals:   08/05/24 0500 08/05/24 0600 08/05/24 0700 08/05/24 1104  BP:    116/63  Pulse: 96 100 96 90  Resp: 18 15 15 17   Temp:    (!) 97.5 F (36.4 C)  TempSrc:    Oral  SpO2: 97% 99% 94% 97%  Weight:        General exam: Elderly male, moderately built and morbidly obese sitting up at edge of bed without distress. Respiratory system: Clear to auscultation. Respiratory effort normal. Cardiovascular system: S1 & S2 heard, RRR. No JVD, murmurs, rubs, gallops or clicks. No pedal edema.  Telemetry personally reviewed at bedside: V paced rhythm with occasional intermittent PVCs.  Stable. Gastrointestinal system: Abdomen is nondistended but protuberant, soft and nontender. No organomegaly or masses felt. Normal bowel sounds heard. Central nervous system: Alert and oriented. No focal neurological deficits. Extremities: Symmetric 5 x 5 power. Skin: No rashes, lesions or ulcers Psychiatry: Judgement and insight appear normal. Mood & affect appropriate.     The results of significant diagnostics from this hospitalization (including imaging, microbiology, ancillary and laboratory) are listed below for reference.     Microbiology: No results found for this  or any previous visit (from the past 240 hours).   Labs: CBC: Recent Labs  Lab 08/03/24 1200 08/04/24 0246 08/05/24 0227  WBC 11.8* 12.3* 11.9*  NEUTROABS 7.9*  --   --   HGB 11.6* 11.2* 10.7*  HCT 35.7* 34.9* 33.6*  MCV 88.8 88.1 87.5  PLT  272 273 264    Basic Metabolic Panel: Recent Labs  Lab 08/03/24 1200 08/04/24 0246 08/05/24 0227  NA 134* 136 136  K 4.1 3.6 3.4*  CL 96* 98 100  CO2 27 27 29   GLUCOSE 100* 102* 120*  BUN 52* 54* 43*  CREATININE 2.56* 2.33* 1.96*  CALCIUM  8.1* 7.9* 8.2*    Liver Function Tests: Recent Labs  Lab 08/03/24 1200 08/04/24 0246  AST 26 25  ALT 15 15  ALKPHOS 87 83  BILITOT 1.0 1.0  PROT 6.3* 6.2*  ALBUMIN  2.3* 2.2*    CBG: Recent Labs  Lab 08/04/24 1129 08/04/24 1506 08/04/24 2129 08/05/24 0559 08/05/24 1132  GLUCAP 118* 121* 108* 110* 95    Hgb A1c Recent Labs    08/04/24 0246  HGBA1C 6.2*    Lipid Profile No results for input(s): CHOL, HDL, LDLCALC, TRIG, CHOLHDL, LDLDIRECT in the last 72 hours.  Thyroid  function studies Recent Labs    08/03/24 1201  TSH 1.969    Discussed in detail with patient's daughter via phone, updated care and answered all questions.   Time coordinating discharge: 45 minutes  SIGNED:  Trenda Mar, MD,  FACP, [facility], [facility], [facility]   [facility] [place]     To contact the attending provider between 7A-7P or the covering provider during after hours 7P-7A, please log into the web site [email or web address] and access using universal  password for that web site. If you do not have the password, please call the hospital operator.    [1]  Allergies Allergen Reactions   Peanut (Diagnostic) Anaphylaxis   Peanut-Containing Drug Products Anaphylaxis   Sulfa Antibiotics Anaphylaxis   Sulfonamide Derivatives Anaphylaxis   Eliquis  [email or web address] ] Other (See Comments)    Back/hip pain   Norvasc  [Amlodipine ] Swelling    *lower extremity swelling   Xarelto  [Rivaroxaban ] Other (See Comments)    Back/hip pain   Zestril  [Lisinopril ] Cough   Lactose Other (See Comments)    Unknown reaction

## 2024-08-05 NOTE — Progress Notes (Addendum)
 Advanced Heart Failure Rounding Note  Cardiologist: [name], MD  Chief Complaint: AKI Subjective:    Some soft BP readings, overall improving. sCr 2.56>2.33>1.96  Feeling well this morning. No SOB. Eating/drinking okay. Walked with PT yesterday.  Objective:    Weight Range: 119.6 kg Body mass index is 51.49 kg/m.   Vital Signs:   Temp:  [97.7 F (36.5 C)-98.2 F (36.8 C)] 98.2 F (36.8 C) (12/11 0342) Pulse Rate:  [87-105] 96 (12/11 0700) Resp:  [15-28] 15 (12/11 0700) BP: (87-121)/(55-75) 121/75 (12/11 0342) SpO2:  [92 %-100 %] 94 % (12/11 0700) Weight:  [119.6 kg] 119.6 kg (12/11 0431) Last BM Date : 08/04/24  Weight change: Filed Weights   08/04/24 0500 08/05/24 0431  Weight: 119.6 kg 119.6 kg   Intake/Output:  Intake/Output Summary (Last 24 hours) at 08/05/2024 0743 Last data filed at 08/05/2024 0156 Gross per 24 hour  Intake 360 ml  Output 600 ml  Net -240 ml    Physical Exam    General: Elderly appearing. No distress  Cardiac: JVP ~8cm. No murmurs  Resp: fine crackles in lower lobes Abdomen: Soft, non-distended.  Extremities: Warm and dry.  No peripheral edema.  Neuro: A&O x3. Affect pleasant.   Telemetry   SR 90s with frequent PVCs (personally reviewed)  Labs    CBC Recent Labs    08/03/24 1200 08/04/24 0246 08/05/24 0227  WBC 11.8* 12.3* 11.9*  NEUTROABS 7.9*  --   --   HGB 11.6* 11.2* 10.7*  HCT 35.7* 34.9* 33.6*  MCV 88.8 88.1 87.5  PLT 272 273 264   Basic Metabolic Panel Recent Labs    87/89/74 0246 08/05/24 0227  NA 136 136  K 3.6 3.4*  CL 98 100  CO2 27 29  GLUCOSE 102* 120*  BUN 54* 43*  CREATININE 2.33* 1.96*  CALCIUM  7.9* 8.2*   Liver Function Tests Recent Labs    08/03/24 1200 08/04/24 0246  AST 26 25  ALT 15 15  ALKPHOS 87 83  BILITOT 1.0 1.0  PROT 6.3* 6.2*  ALBUMIN  2.3* 2.2*   BNP (last 3 results) Recent Labs    07/15/24 1227 07/21/24 1615 08/03/24 1200  BNP 653.6* 716.3*  674.3*   Medications:    Scheduled Medications:  allopurinol   100 mg Oral Daily   amiodarone   200 mg Oral Daily   aspirin  EC  81 mg Oral Daily   atorvastatin   20 mg Oral Daily   dabigatran   75 mg Oral Q12H   docusate sodium   100 mg Oral BID   fluticasone   2 spray Each Nare Daily   insulin  aspart  0-9 Units Subcutaneous TID WC   levothyroxine   175 mcg Oral Q0600   loratadine   10 mg Oral Daily   metoCLOPramide   10 mg Oral BID AC   metoprolol  succinate  50 mg Oral QHS   montelukast   10 mg Oral QHS   pantoprazole   40 mg Oral BID   potassium chloride   40 mEq Oral Once   sodium chloride  flush  3 mL Intravenous Q12H   spironolactone   25 mg Oral Daily   umeclidinium bromide   1 puff Inhalation Daily   venlafaxine  XR  37.5 mg Oral Daily    Infusions:   PRN Medications: acetaminophen  **OR** acetaminophen , senna-docusate  Patient Profile   [name] is a 89 year old with a history of permanent A fib, AS S/P TAVR, medtronic BiV ICD, CKD Stage IIIa, CAD, CABG 2007, and chronic HFrEF. Admitting  with hypotension & AKI on CKD Stage IIIa.  Assessment/Plan   AKI on CKD Stage IIIa, Hypotension - prerenal due to over diuresis and hypotension  - baseline Cr ~1.3-1.5. This admit sCr 2.56. - improving, 1.93 today  Chronic Biventricular HFrEF, ICM/NICM  - Echo 3/25: EF 40-45% RV moderately reduced  - Has CRT-D Medtronic. Optivol fluid index below threshold with impedence - volume looks ok, check ReDs - continue spiro 25 mg daily - increase toprol  to 50 mg daily at bedtime - stop entresto  - restart torsemide  at 80 mg daily (has taken 80/40 for long time, may need to increase at follow up)   Permanent Afib/Frequent PVCs  - Rate controlled - continue amio 200 mg daily. Hold Toprol  XL   - continue pradaxa  75 BID   CAD s/p CABG x4 2007 - patent grafts on cath 1/18 with residual disease throughout - continue statin/ASA   Hypothyroid: thyroid  labs ok - continue levothyrox   AS: s/p  TAVR 2018   DNR    Anticipate discharge in the next 24h.  HF Team Medication Recommendations for Home: - amio 200 mg daily - aspirin  81 mg daily - atorvastain 20 mg daily - pradaxa  150 mg bid - synthroid  175 mcg daily - toprol  50 mg daily at bedtime - spiro 25 mg daily - torsemide  80 mg daily - potassium 40 mg bid  HF follow up scheduled for 08/13/24 at 12:00 pm  Length of Stay: 2  Lismary Kiehn, NP  08/05/2024, 7:43 AM  Advanced Heart Failure Team Pager [phone] (M-F; 7a - 5p)  Please contact [facility] for night-coverage after hours (5p -7a ) and weekends on [email or web address]

## 2024-08-05 NOTE — TOC Initial Note (Addendum)
 Transition of Care [facility]) - Initial/Assessment Note    Patient Details  Name: Justin [name] MRN: [id number] Date of Birth: 17-May-1934  Transition of Care [facility]) CM/SW Contact:    Arlana JINNY Nicholaus [place] Phone Number: [phone] 08/05/2024, 11:50 AM  Clinical Narrative:   HF CSW met with patient at bedside. Patient stated that he is [facility]. Patient stated that wife lives there as well, but on the other side. Patient stated that he has past history with [facility] services. Patient stated that he uses a walker, electric scooter, CPAP, and oxygen. Patient stated that he has a scale. Patient stated that he has a PCP. CSW explained that a hospital follow up appointment is typically scheduled closer towards dc. Patient is agreeable and requested an afternoon appointment. Daughter will provider transportation at dc.   Hospital follow up appointment scheduled for Thursday, August 25, 2024 at 2:00 PM.   HF CSW/CM will continue to follow and monitor for dc readiness.                       Patient Goals and CMS Choice            Expected Discharge Plan and Services                                              Prior Living Arrangements/Services                       Activities of Daily Living   ADL Screening (condition at time of admission) Independently performs ADLs?: Yes (appropriate for developmental age) Is the patient deaf or have difficulty hearing?: No Does the patient have difficulty seeing, even when wearing glasses/contacts?: Yes Does the patient have difficulty concentrating, remembering, or making decisions?: No  Permission Sought/Granted                  Emotional Assessment              Admission diagnosis:  Weakness [R53.1] AKI (acute kidney injury) [N17.9] Hypotension, unspecified hypotension type [I95.9] Patient Active Problem List   Diagnosis Date Noted   Hypotension 08/03/2024   Acute renal failure superimposed on  stage 3a chronic kidney disease (HCC) 08/03/2024   Mild cognitive impairment 08/02/2024   Dry skin dermatitis 07/30/2024   COPD (chronic obstructive pulmonary disease) (HCC) 05/27/2024   Hyponatremia 02/16/2024   UTI (urinary tract infection) 12/19/2023   Recurrent depression 12/19/2023   Candidiasis 12/15/2023   Pleural effusion 11/20/2023   Asymptomatic bacteriuria 11/20/2023   Bronchitis 11/06/2023   Type 2 diabetes mellitus (HCC) 10/23/2023   Leukocytosis 10/16/2023   Right ventricular dysfunction 07/05/2023   Chronic kidney disease, stage 3a (HCC) 07/05/2023   Persistent atrial fibrillation (HCC) 07/05/2023   Long term (current) use of anticoagulants 07/05/2023   NSVT (nonsustained ventricular tachycardia) (HCC) 05/23/2022   Sinus node dysfunction (HCC) 05/23/2022   Frequent PVCs 05/23/2022   Biventricular implantable cardioverter-defibrillator (ICD) in situ 03/12/2022   Atypical atrial flutter (HCC) 01/30/2022   Secondary hypercoagulable state 04/19/2021   Raynaud's phenomenon without gangrene 07/11/2020   Vertigo 07/11/2020   Loosening of knee joint prosthesis 05/18/2019   Cough 04/30/2019   Gastroesophageal reflux disease 04/30/2019   S/P ICD (internal cardiac defibrillator) procedure 05/03/2017   NICM (nonischemic cardiomyopathy) (HCC) 05/02/2017   History of  transcatheter aortic valve replacement (TAVR) 03/04/2017   Severe aortic valve stenosis 03/04/2017   Hypokalemia due to loss of potassium with diuresis 01/10/2017   Aortic stenosis 01/10/2017   Chronic systolic CHF (congestive heart failure) (HCC) 01/10/2017   Chronic congestive heart failure (HCC) 01/07/2017   Hypothyroidism 12/23/2016   Prostate cancer (HCC)    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   Edema of both legs 09/01/2014   Asthma, intermittent 09/01/2014   Epiphora due to insufficient drainage 07/02/2011   Rosacea blepharoconjunctivitis 07/02/2011   Coronary artery disease involving native coronary  artery of native heart without angina pectoris    GOUT 09/12/2009   CAD, AUTOLOGOUS BYPASS GRAFT 05/10/2009   MURMUR 05/08/2009   BRUIT 05/08/2009   MUSCLE CRAMPS 12/12/2008   KNEE SPRAIN 09/21/2008   Osteoarthritis 07/07/2008   BPH with urinary obstruction 02/01/2008   LOW BACK PAIN SYNDROME 02/01/2008   TESTOSTERONE DEFICIENCY 07/03/2007   Hyperlipidemia 07/03/2007   Essential hypertension 07/03/2007   MYOCARDIAL INFARCTION, HX OF 07/03/2007   Allergic rhinitis 07/03/2007   History of colonic polyps 07/03/2007   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   PCP:  Mast, Man X, NP Pharmacy:   [facility] - [place], [place] - [address] [place] [address] Phone: [phone] Fax: [phone]     Social Drivers of Health (SDOH) Social History: SDOH Screenings   Food Insecurity: No Food Insecurity (11/18/2023)  Housing: Low Risk (11/18/2023)  Transportation Needs: No Transportation Needs (11/18/2023)  Utilities: Not At Risk (11/18/2023)  Alcohol Screen: Low Risk (03/31/2023)  Depression (PHQ2-9): Low Risk (04/05/2024)  Financial Resource Strain: Low Risk (03/31/2023)  Physical Activity: Sufficiently Active (03/31/2023)  Social Connections: Socially Integrated (11/18/2023)  Stress: No Stress Concern Present (03/31/2023)  Tobacco Use: Medium Risk (08/03/2024)  Health Literacy: Adequate Health Literacy (03/31/2023)   SDOH Interventions:     Readmission Risk Interventions    11/19/2023    1:00 PM  Readmission Risk Prevention Plan  Transportation Screening Complete  PCP or Specialist Appt within 3-5 Days Complete  HRI or Home Care Consult Complete  Social Work Consult for Recovery Care Planning/Counseling Complete  Palliative Care Screening Not Applicable  Medication Review Oceanographer) Complete

## 2024-08-05 NOTE — Progress Notes (Signed)
 Nursing Discharge Note   Name: Justin [name] MRN: [id number] DOB: [birth year]    Admit Date:  08/03/2024  Discharge Date:  08/05/2024   Justin [name] is to be discharged home per MD order.  AVS completed. Reviewed with patient and family at bedside by Christopher Larve LPN. Highlighted copy provided for patient to take home.  Patient/caregiver able to verbalize understanding of discharge instructions. PIV removed. Patient stable upon discharge.     Discharge Instructions      Additional Discharge Instructions   Please get your medications reviewed and adjusted by your Primary MD.  Please request your Primary MD to go over all Hospital Tests and Procedure/Radiological results at the follow up, please get all Hospital records sent to your Primary MD by signing hospital release before you go home.  If you had Pneumonia of Lung problems at the Hospital: Please get a 2 view Chest X ray done in approximately 4 weeks after hospital discharge or sooner if instructed by your Primary MD.  If you have Congestive Heart Failure: Please call your Cardiologist or Primary MD anytime you have any of the following symptoms:  1) 3 pound weight gain in 24 hours or 5 pounds in 1 week  2) shortness of breath, with or without a dry hacking cough  3) swelling in the hands, feet or stomach  4) if you have to sleep on extra pillows at night in order to breathe  Follow cardiac low salt diet and 1.5 lit/day fluid restriction.  If you have diabetes Accuchecks 4 times/day, Once in AM empty stomach and then before each meal. Log in all results and show them to your primary doctor at your next visit. If any glucose reading is under 80 or above 300 call your primary MD immediately.  If you have Seizure/Convulsions/Epilepsy: Please do not drive, operate heavy machinery, participate in activities at heights or participate in high speed sports until you have seen by Primary MD or a Neurologist and  advised to do so again. Per [place]  DMV statutes, patients with seizures are not allowed to drive until they have been seizure-free for six months.  Use caution when using heavy equipment or power tools. Avoid working on ladders or at heights. Take showers instead of baths. Ensure the water temperature is not too high on the home water heater. Do not go swimming alone. Do not lock yourself in a room alone (i.e. bathroom). When caring for infants or small children, sit down when holding, feeding, or changing them to minimize risk of injury to the child in the event you have a seizure. Maintain good sleep hygiene. Avoid alcohol.   If you had Gastrointestinal Bleeding: Please ask your Primary MD to check a complete blood count within one week of discharge or at your next visit. Your endoscopic/colonoscopic biopsies that are pending at the time of discharge, will also need to followed by your Primary MD.  Get Medicines reviewed and adjusted. Please take all your medications with you for your next visit with your Primary MD  Please request your Primary MD to go over all hospital tests and procedure/radiological results at the follow up, please ask your Primary MD to get all Hospital records sent to his/her office.  If you experience worsening of your admission symptoms, develop shortness of breath, life threatening emergency, suicidal or homicidal thoughts you must seek medical attention immediately by calling 911 or calling your MD immediately  if symptoms less severe.  You must read  complete instructions/literature along with all the possible adverse reactions/side effects for all the Medicines you take and that have been prescribed to you. Take any new Medicines after you have completely understood and accpet all the possible adverse reactions/side effects.   Do not drive or operate heavy machinery when taking Pain medications.   Do not take more than prescribed Pain, Sleep and Anxiety  Medications  Special Instructions: If you have smoked or chewed Tobacco  in the last 2 yrs please stop smoking, stop any regular Alcohol  and or any Recreational drug use.  Wear Seat belts while driving.  Please note You were cared for by a hospitalist during your hospital stay. If you have any questions about your discharge medications or the care you received while you were in the hospital after you are discharged, you can call the unit and asked to speak with the hospitalist on call if the hospitalist that took care of you is not available. Once you are discharged, your primary care physician will handle any further medical issues. Please note that NO REFILLS for any discharge medications will be authorized once you are discharged, as it is imperative that you return to your primary care physician (or establish a relationship with a primary care physician if you do not have one) for your aftercare needs so that they can reassess your need for medications and monitor your lab values.  You can reach the hospitalist office at phone [phone] or fax [phone]   If you do not have a primary care physician, you can call [phone] for a physician referral.       Discharge Instructions     (HEART FAILURE PATIENTS) Call MD:  Anytime you have any of the following symptoms: 1) 3 pound weight gain in 24 hours or 5 pounds in 1 week 2) shortness of breath, with or without a dry hacking cough 3) swelling in the hands, feet or stomach 4) if you have to sleep on extra pillows at night in order to breathe.   Complete by: As directed    Call MD for:  difficulty breathing, headache or visual disturbances   Complete by: As directed    Call MD for:  extreme fatigue   Complete by: As directed    Call MD for:  persistant dizziness or light-headedness   Complete by: As directed    Call MD for:  persistant nausea and vomiting   Complete by: As directed    Call MD for:  severe uncontrolled pain   Complete  by: As directed    Call MD for:  temperature >100.4   Complete by: As directed    Diet - low sodium heart healthy   Complete by: As directed    Diet Carb Modified   Complete by: As directed    Discharge instructions   Complete by: As directed    Take BP every morning 1 hour after morning medications. Notify daughter  if SBP < 70. Take daily weights; refer to PRN torsemide  order for weight gain instruction. Notify HF clinic [phone]) for weight gain of >10lb total from 263 lbs.   Discharge instructions   Complete by: As directed    Continue to check fingerstick CBGs 3 times daily AC and at bedtime and contact MD if CBGs consistently >180.   Increase activity slowly   Complete by: As directed         Allergies as of 08/05/2024       Reactions   Peanut (  diagnostic) Anaphylaxis   Peanut-containing Drug Products Anaphylaxis   Sulfa Antibiotics Anaphylaxis   Sulfonamide Derivatives Anaphylaxis   Eliquis  [apixaban ] Other (See Comments)   Back/hip pain   Norvasc  [amlodipine ] Swelling   *lower extremity swelling   Xarelto  [rivaroxaban ] Other (See Comments)   Back/hip pain   Zestril  [lisinopril ] Cough   Lactose Other (See Comments)   Unknown reaction        Medication List     STOP taking these medications    Entresto  24-26 MG Generic drug: sacubitril-valsartan   insulin  aspart 100 UNIT/ML injection Commonly known as: novoLOG    isosorbide  mononitrate 30 MG 24 hr tablet Commonly known as: IMDUR    metolazone  2.5 MG tablet Commonly known as: ZAROXOLYN    metolazone  5 MG tablet Commonly known as: ZAROXOLYN    PROBIOTIC PO   triamcinolone  cream 0.5 % Commonly known as: KENALOG        TAKE these medications    albuterol  108 (90 Base) MCG/ACT inhaler Commonly known as: VENTOLIN  HFA Inhale 2 puffs into the lungs every 4 (four) hours as needed for wheezing or shortness of breath.   allopurinol  100 MG tablet Commonly known as: ZYLOPRIM  Take 1 tablet (100 mg  total) by mouth daily.   amiodarone  200 MG tablet Commonly known as: PACERONE  Take 1 tablet (200 mg total) by mouth daily.   aspirin  81 MG chewable tablet Chew 81 mg by mouth daily.   atorvastatin  20 MG tablet Commonly known as: LIPITOR Take 20 mg by mouth daily.   cetirizine 10 MG tablet Commonly known as: ZYRTEC Take 5 mg by mouth daily.   dabigatran  150 MG Caps capsule Commonly known as: Pradaxa  TAKE (1) CAPSULE TWICE DAILY.   diclofenac  Sodium 1 % Gel Commonly known as: Voltaren  Apply 4 g topically 3 (three) times daily as needed (pain). What changed:  how much to take when to take this additional instructions   docusate sodium  100 MG capsule Commonly known as: COLACE Take 100 mg by mouth in the morning and at bedtime.   fluticasone  50 MCG/ACT nasal spray Commonly known as: FLONASE  Place 2 sprays into both nostrils daily.   levothyroxine  175 MCG tablet Commonly known as: SYNTHROID  Take 175 mcg by mouth daily before breakfast.   metoCLOPramide  10 MG tablet Commonly known as: REGLAN  Take 10 mg by mouth 2 (two) times daily before a meal.   metoprolol  succinate 100 MG 24 hr tablet Commonly known as: TOPROL -XL Take 0.5 tablets (50 mg total) by mouth at bedtime. What changed:  how much to take when to take this   montelukast  10 MG tablet Commonly known as: SINGULAIR  TAKE ONE TABLET BY MOUTH ONCE DAILY IN THE EVENING What changed:  how much to take how to take this when to take this additional instructions   Multivitamin Gummies Mens Chew Chew 2 each by mouth daily. What changed: Another medication with the same name was removed. Continue taking this medication, and follow the directions you see here.   oxyCODONE  5 MG immediate release tablet Commonly known as: Oxy IR/ROXICODONE  Take 5 mg by mouth every 8 (eight) hours as needed (knee pain).   OXYGEN Inhale 2 L/min into the lungs continuous.   pantoprazole  40 MG tablet Commonly known as:  PROTONIX  Take 40 mg by mouth in the morning and at bedtime.   potassium chloride  SA 20 MEQ tablet Commonly known as: KLOR-CON  M Take 2 tablets (40 mEq total) by mouth 2 (two) times daily. What changed:  how much to take when  to take this additional instructions   Spiriva  Respimat 2.5 MCG/ACT Aers Generic drug: Tiotropium Bromide Inhale 2 puffs into the lungs daily.   spironolactone  25 MG tablet Commonly known as: ALDACTONE  Take 1 tablet (25 mg total) by mouth daily. What changed:  when to take this additional instructions   tamsulosin 0.4 MG Caps capsule Commonly known as: FLOMAX Take 0.4 mg by mouth at bedtime.   torsemide  20 MG tablet Commonly known as: DEMADEX  Take 4 tablets (80 mg) by mouth every morning and in addition, take 2 tablets (40 mg total) by mouth as needed  (at 7pm) if has a weight gain of 3 lbs in 1 day or 5 lbs in a week. Check weight in the morning daily. What changed: See the new instructions.   UNABLE TO FIND Inhale 1 Device into the lungs at bedtime. CPAP; Apply CPAP mask at bedtime.   venlafaxine  XR 37.5 MG 24 hr capsule Commonly known as: EFFEXOR -XR Take 37.5 mg by mouth daily.         Discharge Instructions/ Education: An After Visit Summary was printed and given to the patient. Discharge instructions given to patient/family with verbalized understanding. Discharge education completed with patient/family including: follow up instructions, medication list, discharge activities, and limitations if indicated.  Additional discharge instructions as indicated by discharging provider also reviewed.  Patient and family able to verbalize understanding, all questions fully answered. Patient instructed to return to Emergency Department, call 911, or call MD for any changes in condition.   Patient escorted via wheelchair to lobby and discharged home via private automobile.

## 2024-08-06 ENCOUNTER — Non-Acute Institutional Stay: Payer: Self-pay | Admitting: Family Medicine

## 2024-08-06 DIAGNOSIS — I4819 Other persistent atrial fibrillation: Secondary | ICD-10-CM | POA: Diagnosis not present

## 2024-08-06 DIAGNOSIS — I959 Hypotension, unspecified: Secondary | ICD-10-CM | POA: Diagnosis not present

## 2024-08-06 DIAGNOSIS — I5022 Chronic systolic (congestive) heart failure: Secondary | ICD-10-CM | POA: Diagnosis not present

## 2024-08-06 NOTE — Progress Notes (Signed)
 Provider:  Garnette Pinal, MD Location:      Place of Service:     PCP: Mast, Man X, NP Patient Care Team: Mast, Man X, NP as PCP - General (Internal Medicine) [name], MD as PCP - Cardiology (Cardiology) [name] Chew, MD as PCP - Electrophysiology (Cardiology) Nieves Cough, MD as Consulting Physician (Urology) Liane Sharyne MATSU, [facility] (Inactive) as Pharmacist (Pharmacist) Leslee Reusing, MD as Consulting Physician (Ophthalmology)  Extended Emergency Contact Information Primary Emergency Contact: [facility] Address: [address]          [place], [place] [address] [place] Phone: [phone] Relation: Daughter Secondary Emergency Contact: Billie Risen Address: [address]          [place], [place] [address] [place] Home Phone: [phone] Mobile Phone: [phone] Relation: Daughter  Code Status:  Goals of Care: Advanced Directive information    08/04/2024    3:14 PM  Advanced Directives  Does patient want to make changes to medical advance directive? No - Patient declined      HPI: Patient is a 89 y.o. male seen today for admission to [facility] assisted living.  This is actually a readmission.  He was hospitalized from 08/03/2024 to 08/05/2024 with hypotension probably the result of too vigorous diuresis.  He has a history of heart disease, status post CABG, chronic biventricular heart failure, atrial fibrillation, and status post TAVR with an ICD in place. After the hypotension was noted in his cardiology office he was sent to the emergency room where he received IV fluid bolus of 3 to 500 mL with improvement in his blood pressure with systolic in the 80s.  Diuretics were briefly held and some of his cardiac medicines including Imdur  and Entresto  were indefinitely discontinued.  Creatinine was slightly increased on admission but did not meet standards for acute kidney Failure.  Post  hydration creatinine improved and returned to baseline Past Medical History:  Diagnosis Date   Age-related macular degeneration, dry, both eyes    Allergic    24/7; 365 days/year; I'm allergic to pollens, dust, all southern grasses/trees, mold, mildue, cats, dogs (01/07/2017)   Anal fissure    Asthma    sees Dr. Alaine    Benign prostatic hypertrophy    (sees Dr. Colan   CAD (coronary artery disease)    a. s/p CABG 2007. b. Cath 08/2016 - 4/4 patent grafts.   Carotid bruit    carotid u/s 10/10: 0.39% bilaterally   Chronic combined systolic and diastolic CHF (congestive heart failure) (HCC)    Complication of anesthesia 1980s   w/anal cyst OR, he gave me a saddle block then put a narcotic in spinal cord; had a severe reaction to that (01/07/2017)   Congestive heart failure (CHF) (HCC)    Coronavirus infection 11/18/2023   ED (erectile dysfunction)    Family history of adverse reaction to anesthesia    daughter wakes up during OR (01/07/2017)   GERD (gastroesophageal reflux disease)    Gout    HTN (hypertension)    Hx of colonic polyps    (sees Dr. Abran)   Hyperlipidemia    Hypothyroidism    Moderate to severe aortic stenosis    a. s/p TAVR 02/2017.   Myocardial infarction [facility]) ~ 2000   Obesity    Osteoarthritis    was in my knees, hands (01/07/2017 )   PAF (paroxysmal atrial fibrillation) (HCC)    a. documented post TAVR.   Precancerous skin lesion    (  sees Dr. 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   29 mm Edwards Sapien 3 transcatheter heart valve placed via percutaneous right transfemoral approach   Past Surgical History:  Procedure Laterality Date   ANUS SURGERY     opened it back up cause it wouldn't heal; wound up w/a fissure (01/07/2017)   BIV ICD INSERTION CRT-D N/A 05/02/2017   Procedure: BIV ICD INSERTION CRT-D;  Surgeon: [name], MD;  Location: [facility]  INVASIVE CV LAB;  Service: Cardiovascular;  Laterality: N/A;   CARDIAC CATHETERIZATION  10/29/2005   CARDIAC CATHETERIZATION N/A 08/27/2016   Procedure: Right/Left Heart Cath and Coronary/Graft Angiography;  Surgeon: Lonni JONETTA Cash, MD;  Location: [facility] INVASIVE CV LAB;  Service: Cardiovascular;  Laterality: N/A;   CARDIOVERSION N/A 04/24/2021   Procedure: CARDIOVERSION;  Surgeon: Okey Vina GAILS, MD;  Location: [facility] ENDOSCOPY;  Service: Cardiovascular;  Laterality: N/A;   CARDIOVERSION N/A 11/08/2021   Procedure: CARDIOVERSION;  Surgeon: Mona Vinie BROCKS, MD;  Location: [facility] ENDOSCOPY;  Service: Cardiovascular;  Laterality: N/A;   CARDIOVERSION N/A 02/06/2022   Procedure: CARDIOVERSION;  Surgeon: Delford Maude BROCKS, MD;  Location: [facility] ENDOSCOPY;  Service: Cardiovascular;  Laterality: N/A;   CATARACT EXTRACTION W/ INTRAOCULAR LENS  IMPLANT, BILATERAL Bilateral    CATARACT EXTRACTION, BILATERAL  2012   COLONOSCOPY  06/30/2008   no repeats needed    COLONOSCOPY     had 3 or 4 in the past    CORONARY ARTERY BYPASS GRAFT  2007   CABG X4   CYST EXCISION PERINEAL  1980s   HAMMER TOE SURGERY Bilateral    ICD GENERATOR CHANGEOUT N/A 05/30/2023   Procedure: ICD GENERATOR CHANGEOUT;  Surgeon: [name], MD;  Location[facility] INVASIVE CV LAB;  Service: Cardiovascular;  Laterality: N/A;   JOINT REPLACEMENT     KNEE ARTHROPLASTY  07/30/2011   Procedure: COMPUTER ASSISTED TOTAL KNEE ARTHROPLASTY;  Surgeon: Cordella Glendia Hutchinson;  Location: [facility] OR;  Service: Orthopedics;  Laterality: Left;  left total knee arthroplasty   MASTECTOMY SUBCUTANEOUS Bilateral    MULTIPLE TOOTH EXTRACTIONS     ORIF FINGER / THUMB FRACTURE Right ~ 1980   repair of thumb injury   PROSTATE BIOPSY     REPLACEMENT TOTAL KNEE BILATERAL Bilateral 2012   TEE WITHOUT CARDIOVERSION N/A 03/04/2017   Procedure: TRANSESOPHAGEAL ECHOCARDIOGRAM (TEE);  Surgeon: Cash Lonni JONETTA, MD;  Location: [facility] OR;  Service: Open Heart Surgery;  Laterality:  N/A;   TOTAL KNEE REVISION Right 05/18/2019   Procedure: RIGHT PATELLA REVISION/REMOVAL;  Surgeon: Hutchinson Cordella Glendia, MD;  Location: [facility] OR;  Service: Orthopedics;  Laterality: Right;   TRANSCATHETER AORTIC VALVE REPLACEMENT, TRANSFEMORAL N/A 03/04/2017   Procedure: TRANSCATHETER AORTIC VALVE REPLACEMENT, TRANSFEMORAL;  Surgeon: Cash Lonni JONETTA, MD;  Location: [facility] OR;  Service: Open Heart Surgery;  Laterality: N/A;    reports that he quit smoking about 62 years ago. His smoking use included cigarettes. He started smoking about 75 years ago. He has a 45.5 pack-year smoking history. He has never used smokeless tobacco. He reports that he does not currently use alcohol after a past usage of about 7.0 standard drinks of alcohol per week. He reports that he does not use drugs. Social History   Socioeconomic History   Marital status: Married    Spouse name: Not on file   Number of children: 3   Years of education: Not  on file   Highest education level: Not on file  Occupational History   Occupation: product/process development scientist    Comment: builds malls  Tobacco Use   Smoking status: Former    Current packs/day: 0.00    Average packs/day: 3.5 packs/day for 13.0 years (45.5 ttl pk-yrs)    Types: Cigarettes    Start date: 54    Quit date: 1963    Years since quitting: 62.9   Smokeless tobacco: Never   Tobacco comments:    Former smoker 04/19/21  Vaping Use   Vaping status: Never Used  Substance and Sexual Activity   Alcohol use: Not Currently    Alcohol/week: 7.0 standard drinks of alcohol    Types: 7 Cans of beer per week    Comment: 1 beer daily after 5pm (04/19/21)   Drug use: No   Sexual activity: Never  Other Topics Concern   Not on file  Social History Narrative   FH of CAD, Male 1st degree relative less than age 19.   Social Drivers of Health   Tobacco Use: Medium Risk (08/03/2024)   Patient History    Smoking Tobacco Use: Former    Smokeless Tobacco Use: Never    Passive  Exposure: Not on file  Financial Resource Strain: Low Risk (03/31/2023)   Overall Financial Resource Strain (CARDIA)    Difficulty of Paying Living Expenses: Not very hard  Food Insecurity: Patient Declined (08/05/2024)   Epic    Worried About Programme Researcher, Broadcasting/film/video in the Last Year: Patient declined    Barista in the Last Year: Patient declined  Transportation Needs: Patient Declined (08/05/2024)   Epic    Lack of Transportation (Medical): Patient declined    Lack of Transportation (Non-Medical): Patient declined  Physical Activity: Sufficiently Active (03/31/2023)   Exercise Vital Sign    Days of Exercise per Week: 7 days    Minutes of Exercise per Session: 30 min  Stress: No Stress Concern Present (03/31/2023)   [employer] of Occupational Health - Occupational Stress Questionnaire    Feeling of Stress : Not at all  Social Connections: Patient Declined (08/05/2024)   Social Connection and Isolation Panel    Frequency of Communication with Friends and Family: Patient declined    Frequency of Social Gatherings with Friends and Family: Patient declined    Attends Religious Services: Patient declined    Active Member of Clubs or Organizations: Patient declined    Attends Banker Meetings: Patient declined    Marital Status: Patient declined  Intimate Partner Violence: Patient Declined (08/05/2024)   Epic    Fear of Current or Ex-Partner: Patient declined    Emotionally Abused: Patient declined    Physically Abused: Patient declined    Sexually Abused: Patient declined  Depression (PHQ2-9): Low Risk (04/05/2024)   Depression (PHQ2-9)    PHQ-2 Score: 0  Alcohol Screen: Low Risk (03/31/2023)   Alcohol Screen    Last Alcohol Screening Score (AUDIT): 0  Housing: Unknown (08/05/2024)   Epic    Unable to Pay for Housing in the Last Year: Patient declined    Number of Times Moved in the Last Year: 0    Homeless in the Last Year: Patient declined  Utilities: Not At  Risk (11/18/2023)   AHC Utilities    Threatened with loss of utilities: No  Health Literacy: Adequate Health Literacy (03/31/2023)   B1300 Health Literacy    Frequency of need for help with medical instructions: Never  Functional Status Survey:    Family History  Problem Relation Age of Onset   Heart attack Father 19   Allergic rhinitis Father    Asthma Father    Heart failure Mother 9   Uterine cancer Mother    Breast cancer Mother    Colon cancer Neg Hx    Esophageal cancer Neg Hx     Health Maintenance  Topic Date Due   COVID-19 Vaccine (7 - 2025-26 season) 04/26/2024   Influenza Vaccine  11/23/2024 (Originally 03/26/2024)   HEMOGLOBIN A1C  02/02/2025   OPHTHALMOLOGY EXAM  03/16/2025   FOOT EXAM  04/05/2025   Medicare Annual Wellness (AWV)  04/05/2025   DTaP/Tdap/Td (3 - Td or Tdap) 10/28/2030   Pneumococcal Vaccine: 50+ Years  Completed   Zoster Vaccines- Shingrix  Completed   Meningococcal B Vaccine  Aged Out   Hepatitis B Vaccines 19-59 Average Risk  Discontinued    Allergies[1]  Outpatient Encounter Medications as of 08/06/2024  Medication Sig   albuterol  (VENTOLIN  HFA) 108 (90 Base) MCG/ACT inhaler Inhale 2 puffs into the lungs every 4 (four) hours as needed for wheezing or shortness of breath.   allopurinol  (ZYLOPRIM ) 100 MG tablet Take 1 tablet (100 mg total) by mouth daily.   amiodarone  (PACERONE ) 200 MG tablet Take 1 tablet (200 mg total) by mouth daily.   aspirin  81 MG chewable tablet Chew 81 mg by mouth daily.   atorvastatin  (LIPITOR) 20 MG tablet Take 20 mg by mouth daily.   cetirizine (ZYRTEC) 10 MG tablet Take 5 mg by mouth daily.   dabigatran  (PRADAXA ) 150 MG CAPS capsule TAKE (1) CAPSULE TWICE DAILY.   diclofenac  Sodium (VOLTAREN ) 1 % GEL Apply 4 g topically 3 (three) times daily as needed (pain). (Patient taking differently: Apply 1 Application topically 2 (two) times daily. apply to right lateral knee)   docusate sodium  (COLACE) 100 MG capsule Take  100 mg by mouth in the morning and at bedtime.   fluticasone  (FLONASE ) 50 MCG/ACT nasal spray Place 2 sprays into both nostrils daily.   levothyroxine  (SYNTHROID ) 175 MCG tablet Take 175 mcg by mouth daily before breakfast.   metoCLOPramide  (REGLAN ) 10 MG tablet Take 10 mg by mouth 2 (two) times daily before a meal.   metoprolol  succinate (TOPROL -XL) 100 MG 24 hr tablet Take 0.5 tablets (50 mg total) by mouth at bedtime.   montelukast  (SINGULAIR ) 10 MG tablet TAKE ONE TABLET BY MOUTH ONCE DAILY IN THE EVENING (Patient taking differently: Take 10 mg by mouth at bedtime.)   Multiple Vitamins-Minerals (MULTIVITAMIN GUMMIES MENS) CHEW Chew 2 each by mouth daily.   oxyCODONE  (OXY IR/ROXICODONE ) 5 MG immediate release tablet Take 5 mg by mouth every 8 (eight) hours as needed (knee pain).   OXYGEN Inhale 2 L/min into the lungs continuous.   pantoprazole  (PROTONIX ) 40 MG tablet Take 40 mg by mouth in the morning and at bedtime.   potassium chloride  SA (KLOR-CON  M) 20 MEQ tablet Take 2 tablets (40 mEq total) by mouth 2 (two) times daily.   spironolactone  (ALDACTONE ) 25 MG tablet Take 1 tablet (25 mg total) by mouth daily. (Patient taking differently: Take 25 mg by mouth See admin instructions. Give 1 tablet (25mg ) by mouth in the afternoon at 1600.)   tamsulosin (FLOMAX) 0.4 MG CAPS capsule Take 0.4 mg by mouth at bedtime.   Tiotropium Bromide Monohydrate  (SPIRIVA  RESPIMAT) 2.5 MCG/ACT AERS Inhale 2 puffs into the lungs daily.   torsemide  (DEMADEX ) 20 MG tablet Take 4  tablets (80 mg) by mouth every morning and in addition, take 2 tablets (40 mg total) by mouth as needed  (at 7pm) if has a weight gain of 3 lbs in 1 day or 5 lbs in a week. Check weight in the morning daily.   UNABLE TO FIND Inhale 1 Device into the lungs at bedtime. CPAP; Apply CPAP mask at bedtime.   venlafaxine  XR (EFFEXOR -XR) 37.5 MG 24 hr capsule Take 37.5 mg by mouth daily.   No facility-administered encounter medications on file as of  08/06/2024.    Review of Systems  Constitutional: Negative.   HENT: Negative.    Respiratory: Negative.    Cardiovascular: Negative.   Gastrointestinal: Negative.   Genitourinary: Negative.   Musculoskeletal:  Positive for arthralgias.  Neurological: Negative.   Psychiatric/Behavioral: Negative.    All other systems reviewed and are negative.   There were no vitals filed for this visit. There is no height or weight on file to calculate BMI. Physical Exam Vitals and nursing note reviewed.  Constitutional:      Appearance: Normal appearance. He is obese.  HENT:     Mouth/Throat:     Pharynx: Oropharynx is clear.  Cardiovascular:     Rate and Rhythm: Normal rate. Rhythm irregular.  Pulmonary:     Effort: Pulmonary effort is normal.     Breath sounds: Normal breath sounds.  Abdominal:     Palpations: Abdomen is soft.  Musculoskeletal:     Comments: Status post 2 knee replacement  Skin:    General: Skin is warm and dry.     Findings: Rash present.  Neurological:     General: No focal deficit present.     Mental Status: He is alert and oriented to person, place, and time.  Psychiatric:        Mood and Affect: Mood normal.        Behavior: Behavior normal.     Labs reviewed: Basic Metabolic Panel: Recent Labs    11/25/23 0728 11/26/23 0417 11/27/23 0410 12/16/23 0000 08/03/24 1200 08/04/24 0246 08/05/24 0227  NA 134* 134* 134*   < > 134* 136 136  K 4.0 3.8 3.8   < > 4.1 3.6 3.4*  CL 98 97* 96*   < > 96* 98 100  CO2 27 27 29    < > 27 27 29   GLUCOSE 87 94 106*   < > 100* 102* 120*  BUN 41* 43* 46*   < > 52* 54* 43*  CREATININE 1.13 1.11 1.32*   < > 2.56* 2.33* 1.96*  CALCIUM  7.8* 7.8* 7.7*   < > 8.1* 7.9* 8.2*  MG 2.7* 2.6* 2.5*  --   --   --   --    < > = values in this interval not displayed.   Liver Function Tests: Recent Labs    01/20/24 1702 03/16/24 0000 08/03/24 1200 08/04/24 0246  AST 66* 26 26 25   ALT 31 15 15 15   ALKPHOS 130* 155* 87 83   BILITOT 0.7  --  1.0 1.0  PROT 6.9  --  6.3* 6.2*  ALBUMIN  2.6* 3.0* 2.3* 2.2*   No results for input(s): LIPASE, AMYLASE in the last 8760 hours. No results for input(s): AMMONIA in the last 8760 hours. CBC: Recent Labs    12/16/23 0000 01/20/24 1702 08/03/24 1200 08/04/24 0246 08/05/24 0227  WBC 9.7 17.7* 11.8* 12.3* 11.9*  NEUTROABS 5,645.00 12.9* 7.9*  --   --   HGB 12.8* 14.0  11.6* 11.2* 10.7*  HCT 41 44.2 35.7* 34.9* 33.6*  MCV  --  87.9 88.8 88.1 87.5  PLT 211 345 272 273 264   Cardiac Enzymes: Recent Labs    11/22/23 0231  CKTOTAL 29*  CKMB 4.0   BNP: Invalid input(s): POCBNP Lab Results  Component Value Date   HGBA1C 6.2 (H) 08/04/2024   Lab Results  Component Value Date   TSH 1.969 08/03/2024   Lab Results  Component Value Date   VITAMINB12 503 10/14/2023   No results found for: FOLATE Lab Results  Component Value Date   IRON 151 06/30/2023   TIBC 400 06/30/2023   FERRITIN 28 06/30/2023    Imaging and Procedures obtained prior to SNF admission: DG Chest Port 1 View Result Date: 08/03/2024 CLINICAL DATA:  Weakness. EXAM: PORTABLE CHEST 1 VIEW COMPARISON:  01/20/2024. FINDINGS: Cardiomegaly, unchanged. Status post TAVR, CABG, and median sternotomy. Stable left chest wall AICD. Pulmonary vascular congestion with possible mild interstitial edema. No focal consolidation, sizeable pleural effusion, or pneumothorax. No acute osseous abnormality. IMPRESSION: Cardiomegaly with pulmonary vascular congestion and possible mild interstitial edema. Electronically Signed   By: Harrietta Sherry M.D.   On: 08/03/2024 12:55    Assessment/Plan There are no diagnoses linked to this encounter.   Family/ staff Communication:   Labs/tests ordered:  Garnette HERO. Cleotilde, MD [facility] [address] [place], [place] [address] Office [phone]      [1]  Allergies Allergen Reactions   Peanut (Diagnostic) Anaphylaxis   Peanut-Containing  Drug Products Anaphylaxis   Sulfa Antibiotics Anaphylaxis   Sulfonamide Derivatives Anaphylaxis   Eliquis  [Apixaban ] Other (See Comments)    Back/hip pain   Norvasc  [Amlodipine ] Swelling    *lower extremity swelling   Xarelto  [Rivaroxaban ] Other (See Comments)    Back/hip pain   Zestril  [Lisinopril ] Cough   Lactose Other (See Comments)    Unknown reaction

## 2024-08-06 NOTE — Assessment & Plan Note (Signed)
 Patient continues with amiodarone  and Pradaxa 

## 2024-08-06 NOTE — Assessment & Plan Note (Signed)
 Today seems well compensated.  No wheezes or rales; continue with torsemide  , spironolactone  and potassium supplement does have 1+ dependent edema

## 2024-08-06 NOTE — Assessment & Plan Note (Signed)
 Patient treated in hospital with fluid resuscitation and discontinuation of some of his cardiac meds while less vigorous diuresis.  Will follow to see his cardiologist 1 week

## 2024-08-09 ENCOUNTER — Telehealth: Payer: Self-pay

## 2024-08-09 ENCOUNTER — Other Ambulatory Visit (HOSPITAL_COMMUNITY): Payer: Self-pay | Admitting: Cardiology

## 2024-08-09 MED ORDER — TORSEMIDE 20 MG PO TABS: 80.0000 mg | ORAL_TABLET | Freq: Two times a day (BID) | ORAL | 6 refills | Status: DC

## 2024-08-09 NOTE — Telephone Encounter (Signed)
 Received incoming call from daughter Jentzen Minasyan.  She stated patient was transferred from Dr Santa office to hospital at 08/03/2024 office visit due to low BP.  He was discharged 08/05/2024.  Daughter reports she spoke with patient this morning.  He reports SOB and has swelling of fingers (ring is tight) which is his indication he has fluid accumulation.   She is going to [facility] now to visit and will have patient sent remote transmission while she is there for review.

## 2024-08-09 NOTE — Telephone Encounter (Signed)
 Spoke with nurse, Renea at [facility] and advised Torsemide  will be increased to 80 mg twice a day.  She will need a faxed order to make the Torsemide  dosage change.  Advised I am unable to fax the order but HF clinic will fax it to her.   Confirmed fax number is [phone].  She will be looking for the order so his medication can be increased this evening.

## 2024-08-09 NOTE — Telephone Encounter (Signed)
 Attempted call to daughter Aengus Sauceda.  Left message regarding updated Torsemide  dosage and advised the order has been faxed to [facility] by the HF clinic.

## 2024-08-09 NOTE — Telephone Encounter (Signed)
 Any changes in meds will need to be faxed to [facility] by HF clinic.

## 2024-08-09 NOTE — Telephone Encounter (Signed)
 Spoke with daughter with [facility] who is currently with patient.   Daughter states Pt is having the following symptoms today. SOB with wheezing  swelling in fingers (ring is tight).   Weight gain of 10 lbs since 08/05/2024 hospital discharge.  No increase in urine output with 7 PM PRN Torsemide  40 mg.   Weights:  08/07/2024 258 lbs AM  08/08/2024 267 lbs AM 08/09/2024 168.8 lbs    Prescribed at hospital discharge: Torsemide  20 mg Take 4 tablets (80 mg) by mouth every morning and in addition, take 2 tablets (40 mg total) by mouth as needed (at 7pm) if has a weight gain of 3 lbs in 1 day or 5 lbs in a week. Check weight in the morning daily.   08/09/2024 Optivol thoracic impedance suggesting possible fluid accumulation starting 08/05/2024 (day of hospital discharge).  Confirmed Torsemide  dosage with [facility] nurse Renea and daughter since hospital discharge. Taking Torsemide  80 mg every AM Received PRN 7 PM dose of 40 mg Saturday (08/07/2024) and Sunday (08/08/2024).   Advanced HF clinic scheduled 08/13/2024.  Seen in the hospital by Jordan Lee, NP.

## 2024-08-09 NOTE — Telephone Encounter (Signed)
 Lee, Jordan, NP to Me  [facility] Triage Pool  (Selected Message)   08/09/24  3:44 PM Increase Torsemide  to 80 mg bid.

## 2024-08-11 NOTE — Progress Notes (Signed)
 ADVANCED HEART FAILURE CLINIC NOTE  Primary Care: Mast, Man X, NP Primary Cardiologist: Dr. Verlin EP: Dr. Fernande HF Cardiologist: Dr. Gardenia  Reason for visit: F/u for heart failure   HPI: Justin [name] is a 89 y.o. male with permanent atrial fibrillation, aortic stenosis s/p TAVR, medtronic BiV ICD in 2018 after TAVR, CKD, CAD s/p CABG in 2007.  Has followed with Dr. Verlin and Dr. Fernande. S/p ICD generator changed 10/24. EF 40-45% at the time. Enrolled in iCM monitoring.  Admitted 3/25 for pneumonia and compressive atelectasis 2/2 COVID and flu infection c/b UTI. S/p thoracentesis 1L. EF 40-45% with mod reduced RV, normal AoV prosthetic. He was discharged to SNF.   Interval hx:  Today he returns for HF follow up and for fluid check. He is followed in [facility] clinic and recent interrogation showed elevated optivol, suggesting ongoing fluid accumulation over the last several wks. He was instructed to increase metolazone  usage from once weekly to twice a wk.   Here w/ daughter. C/w significant volume overload. Wt up 20 lb from dry wt. He reports full med compliance and good UOP w/ torsemide . Robust UOP on metolazone  days. He admits to dietary indiscretion. He resides at a ALF and has minimal control over their food offerings. He does however drink a lot of fluids throughout the day. Also eating a lot of soup. Breathing has worsened, NYHA Class III. + orthopnea.   We attempted device interrogation but unable to obtain (transmitter not working).     Current Outpatient Medications  Medication Sig Dispense Refill   albuterol  (VENTOLIN  HFA) 108 (90 Base) MCG/ACT inhaler Inhale 2 puffs into the lungs every 4 (four) hours as needed for wheezing or shortness of breath. 8 g 5   allopurinol  (ZYLOPRIM ) 100 MG tablet Take 1 tablet (100 mg total) by mouth daily. 90 tablet 3   amiodarone  (PACERONE ) 200 MG tablet Take 1 tablet (200 mg total) by mouth daily. 90 tablet 3   aspirin  81 MG chewable  tablet Chew 81 mg by mouth daily.     atorvastatin  (LIPITOR) 20 MG tablet Take 20 mg by mouth daily.     cetirizine (ZYRTEC) 10 MG tablet Take 5 mg by mouth daily.     dabigatran  (PRADAXA ) 150 MG CAPS capsule TAKE (1) CAPSULE TWICE DAILY. 180 capsule 2   diclofenac  Sodium (VOLTAREN ) 1 % GEL Apply 4 g topically 3 (three) times daily as needed (pain). (Patient taking differently: Apply 1 Application topically 2 (two) times daily. apply to right lateral knee) 50 g 0   docusate sodium  (COLACE) 100 MG capsule Take 100 mg by mouth in the morning and at bedtime.     fluticasone  (FLONASE ) 50 MCG/ACT nasal spray Place 2 sprays into both nostrils daily.     levothyroxine  (SYNTHROID ) 175 MCG tablet Take 175 mcg by mouth daily before breakfast.     metoCLOPramide  (REGLAN ) 10 MG tablet Take 10 mg by mouth 2 (two) times daily before a meal.     metoprolol  succinate (TOPROL -XL) 100 MG 24 hr tablet Take 0.5 tablets (50 mg total) by mouth at bedtime. 60 tablet 0   montelukast  (SINGULAIR ) 10 MG tablet TAKE ONE TABLET BY MOUTH ONCE DAILY IN THE EVENING (Patient taking differently: Take 10 mg by mouth at bedtime.) 90 tablet 3   Multiple Vitamins-Minerals (MULTIVITAMIN GUMMIES MENS) CHEW Chew 2 each by mouth daily.     oxyCODONE  (OXY IR/ROXICODONE ) 5 MG immediate release tablet Take 5 mg by mouth every 8 (  eight) hours as needed (knee pain).     OXYGEN Inhale 2 L/min into the lungs continuous.     pantoprazole  (PROTONIX ) 40 MG tablet Take 40 mg by mouth in the morning and at bedtime.     potassium chloride  SA (KLOR-CON  M) 20 MEQ tablet Take 2 tablets (40 mEq total) by mouth 2 (two) times daily. 120 tablet 1   spironolactone  (ALDACTONE ) 25 MG tablet Take 1 tablet (25 mg total) by mouth daily. (Patient taking differently: Take 25 mg by mouth See admin instructions. Give 1 tablet (25mg ) by mouth in the afternoon at 1600.) 30 tablet 9   tamsulosin (FLOMAX) 0.4 MG CAPS capsule Take 0.4 mg by mouth at bedtime.     Tiotropium  Bromide Monohydrate (SPIRIVA  RESPIMAT) 2.5 MCG/ACT AERS Inhale 2 puffs into the lungs daily.     torsemide  (DEMADEX ) 20 MG tablet Take 4 tablets (80 mg total) by mouth 2 (two) times daily. 150 tablet 6   UNABLE TO FIND Inhale 1 Device into the lungs at bedtime. CPAP; Apply CPAP mask at bedtime.     venlafaxine  XR (EFFEXOR -XR) 37.5 MG 24 hr capsule Take 37.5 mg by mouth daily.     No current facility-administered medications for this visit.   Wt Readings from Last 3 Encounters:  08/05/24 119.6 kg (263 lb 10.7 oz)  08/03/24 119.7 kg (264 lb)  07/30/24 119.4 kg (263 lb 3.2 oz)   There were no vitals taken for this visit.  PHYSICAL EXAM: GENERAL: obese, elderly male NAD Lungs- diminished at bases  CARDIAC:  JVP 10 cm          Irregularly irregular rhythm and rate. No MRG, 1+ b/l LEE  ABDOMEN: obese and distended. NT  EXTREMITIES: Warm and well perfused.  NEUROLOGIC: No obvious FND   Device interrogation attempted interrogation but unable to obtain (transmitter not working), remove interrogation from 11/17 demonstrated decreased impedence/ elevated fluid index suggesting ongoing fluid overload since early October   DATA REVIEW  ECHO: 11/12/22: LVEF 45-50%, RV function low normal.  11/18/23: LVEF 40-45%, RV mod reduced, BAE  CATH: LHC/RHC: 08/2016:  There is moderate aortic valve stenosis. SVG graft was visualized by angiography and is normal in caliber. 1st Mrg lesion, 65 %stenosed. Ost LM to LM lesion, 100 %stenosed. SVG graft was visualized by angiography and is normal in caliber. LIMA graft was visualized by angiography and is normal in caliber. Prox RCA to Mid RCA lesion, 100 %stenosed. SVG graft was visualized by angiography and is normal in caliber and anatomically normal. Hemodynamic findings consistent with mild pulmonary hypertension.   1. 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  2. The Left main is  occluded at the ostium.  3. The LAD fills from the patent IMA graft and the Diagonal fills from the patent vein graft 4. The Circumflex fills from the patent vein graft to the OM. There is a moderate stenosis in the proximal segment of the OM branch proximal to the insertion of the vein graft.  5. The distal RCA fills from the patent vein graft.  6. Moderate AS 7. 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  NYHA class / AHA Stage: III, worsened over recent wks w/ volume overload  Volume status & Diuretics:  volume overloaded on exam and by recent device  interrogation  - Increase Torsemide  from 80/40 to 80 mg bid  - Instructed to take extra metolazone , 2.5 mg, today and again on Saturday w/ Extra 40 mEq of KCl - continue w/ twice wkly metolazone  2.5 mg next wk and further on, Wed + Sundays + extra 40 mEq KCl - Continue KCL 80 bid on daily basis  Vasodilators: Continue Entresto  12/13 mg bid. BP too soft for up titration  Beta-Blocker: Continue Toprol  XL 100 mg daily. MRA: Continue spironolactone  25 mg daily. Cardiometabolic: Off SGLT2i with UTIs. Devices therapies & Valvulopathies: s/p CRT-D, scheduled to see EP tomorrow (they can interrogate device)  Advanced therapies: not a candidate - Obtain BMP and BNP today and will need f/u BMP in 7 wk w/ diuretic increase   2. Permanent Atrial fibrillation & frequent PVCs  - Follow with EP, enrolled in iCM. - Rate controlled.  - Continue amio 200 mg daily. - Continue Pradaxa  150 mg bid.  3. CAD s/p CABG  - CABG in 2007 - LHC in 2018. - denies CP  - Continue ASA. - Continue atorvastatin  20 mg daily.  4. Aortic stenosis  - s/p TAVR - CRT-D placed due to syncope and TAVR. . 5. Hypothyroidism - on synthroid   - Per PCP  6. Dyspnea - Likely multifactorial. Recently worsened in setting of volume  overload (see plan above) - Continue home oxygen - PFTs suggest interstitial process with mild diffusion defect; DLCO 60% pred. Will need to follow annually while on amiodarone    F/u w/ APP in 3-4 wks. Continue remote monitoring through ICM clinic.     [name] Milan, FNP 08/11/2024

## 2024-08-12 ENCOUNTER — Telehealth (HOSPITAL_COMMUNITY): Payer: Self-pay

## 2024-08-12 NOTE — Telephone Encounter (Signed)
 Called to confirm/remind patient of their appointment at the [facility] on 08/13/24.   Appointment:   [x] Confirmed  [] Left mess   [] No answer/No voice mail  [] VM Full/unable to leave message  [] Phone not in service  Patient reminded to bring all medications and/or complete list.  Confirmed patient has transportation. Gave directions, instructed to utilize valet parking.

## 2024-08-13 ENCOUNTER — Ambulatory Visit (HOSPITAL_COMMUNITY)
Admission: RE | Admit: 2024-08-13 | Discharge: 2024-08-13 | Disposition: A | Discharge status: Home / Self Care | Source: Ambulatory Visit | Attending: Family Medicine | Admitting: Family Medicine

## 2024-08-13 ENCOUNTER — Encounter (HOSPITAL_COMMUNITY): Payer: Self-pay

## 2024-08-13 ENCOUNTER — Ambulatory Visit (HOSPITAL_COMMUNITY)
Admission: RE | Admit: 2024-08-13 | Discharge: 2024-08-13 | Disposition: A | Source: Ambulatory Visit | Attending: Family Medicine

## 2024-08-13 ENCOUNTER — Ambulatory Visit (HOSPITAL_COMMUNITY): Payer: Self-pay | Admitting: Family Medicine

## 2024-08-13 VITALS — BP 118/70 | HR 96 | Ht 60.0 in | Wt 272.0 lb

## 2024-08-13 DIAGNOSIS — Z791 Long term (current) use of non-steroidal anti-inflammatories (NSAID): Secondary | ICD-10-CM | POA: Insufficient documentation

## 2024-08-13 DIAGNOSIS — I4821 Permanent atrial fibrillation: Secondary | ICD-10-CM | POA: Diagnosis not present

## 2024-08-13 DIAGNOSIS — Z7901 Long term (current) use of anticoagulants: Secondary | ICD-10-CM | POA: Diagnosis not present

## 2024-08-13 DIAGNOSIS — Z7982 Long term (current) use of aspirin: Secondary | ICD-10-CM | POA: Diagnosis not present

## 2024-08-13 DIAGNOSIS — Z79890 Hormone replacement therapy (postmenopausal): Secondary | ICD-10-CM | POA: Diagnosis not present

## 2024-08-13 DIAGNOSIS — I2510 Atherosclerotic heart disease of native coronary artery without angina pectoris: Secondary | ICD-10-CM | POA: Insufficient documentation

## 2024-08-13 DIAGNOSIS — Z951 Presence of aortocoronary bypass graft: Secondary | ICD-10-CM | POA: Diagnosis not present

## 2024-08-13 DIAGNOSIS — I493 Ventricular premature depolarization: Secondary | ICD-10-CM | POA: Insufficient documentation

## 2024-08-13 DIAGNOSIS — I5023 Acute on chronic systolic (congestive) heart failure: Secondary | ICD-10-CM | POA: Insufficient documentation

## 2024-08-13 DIAGNOSIS — E039 Hypothyroidism, unspecified: Secondary | ICD-10-CM | POA: Diagnosis not present

## 2024-08-13 DIAGNOSIS — Z66 Do not resuscitate: Secondary | ICD-10-CM | POA: Insufficient documentation

## 2024-08-13 DIAGNOSIS — Z952 Presence of prosthetic heart valve: Secondary | ICD-10-CM | POA: Diagnosis not present

## 2024-08-13 DIAGNOSIS — N189 Chronic kidney disease, unspecified: Secondary | ICD-10-CM | POA: Diagnosis not present

## 2024-08-13 DIAGNOSIS — Z79899 Other long term (current) drug therapy: Secondary | ICD-10-CM | POA: Insufficient documentation

## 2024-08-13 DIAGNOSIS — Z95810 Presence of automatic (implantable) cardiac defibrillator: Secondary | ICD-10-CM | POA: Insufficient documentation

## 2024-08-13 DIAGNOSIS — R0600 Dyspnea, unspecified: Secondary | ICD-10-CM | POA: Diagnosis not present

## 2024-08-13 LAB — BASIC METABOLIC PANEL WITH GFR
Anion gap: 12 (ref 5–15)
BUN: 32 mg/dL — ABNORMAL HIGH (ref 8–23)
CO2: 29 mmol/L (ref 22–32)
Calcium: 8.8 mg/dL — ABNORMAL LOW (ref 8.9–10.3)
Chloride: 97 mmol/L — ABNORMAL LOW (ref 98–111)
Creatinine, Ser: 2 mg/dL — ABNORMAL HIGH (ref 0.61–1.24)
GFR, Estimated: 32 mL/min — ABNORMAL LOW
Glucose, Bld: 98 mg/dL (ref 70–99)
Potassium: 3.6 mmol/L (ref 3.5–5.1)
Sodium: 138 mmol/L (ref 135–145)

## 2024-08-13 LAB — CBC
HCT: 36.2 % — ABNORMAL LOW (ref 39.0–52.0)
Hemoglobin: 11.8 g/dL — ABNORMAL LOW (ref 13.0–17.0)
MCH: 28.9 pg (ref 26.0–34.0)
MCHC: 32.6 g/dL (ref 30.0–36.0)
MCV: 88.7 fL (ref 80.0–100.0)
Platelets: 300 K/uL (ref 150–400)
RBC: 4.08 MIL/uL — ABNORMAL LOW (ref 4.22–5.81)
RDW: 16.6 % — ABNORMAL HIGH (ref 11.5–15.5)
WBC: 13.2 K/uL — ABNORMAL HIGH (ref 4.0–10.5)
nRBC: 0 % (ref 0.0–0.2)

## 2024-08-13 LAB — PRO BRAIN NATRIURETIC PEPTIDE: Pro Brain Natriuretic Peptide: 4624 pg/mL — ABNORMAL HIGH

## 2024-08-13 MED ORDER — METOLAZONE 2.5 MG PO TABS: 2.5000 mg | ORAL_TABLET | ORAL | 3 refills | Status: DC

## 2024-08-13 MED ORDER — FUROSEMIDE 10 MG/ML IJ SOLN
INTRAMUSCULAR | Status: AC
  Filled 2024-08-13: qty 8

## 2024-08-13 MED ORDER — FUROSEMIDE 10 MG/ML IJ SOLN
80.0000 mg | Freq: Once | INTRAMUSCULAR | Status: AC
  Administered 2024-08-13: 80 mg via INTRAVENOUS

## 2024-08-13 MED ORDER — POTASSIUM CHLORIDE CRYS ER 20 MEQ PO TBCR
40.0000 meq | EXTENDED_RELEASE_TABLET | Freq: Once | ORAL | Status: AC
  Administered 2024-08-13: 40 meq via ORAL

## 2024-08-13 NOTE — Patient Instructions (Signed)
 START Metolazone  2.5 mg every Wednesday with an extra 40 mEq ( 2 Tabs) of potassium.  Labs done today, your results will be available in MyChart, we will contact you for abnormal readings.  Your physician recommends that you schedule a follow-up appointment in: 2 weeks.  If you have any questions or concerns before your next appointment please send us  a message through [place] or call our office at [phone].    TO LEAVE A MESSAGE FOR THE NURSE SELECT OPTION 2, PLEASE LEAVE A MESSAGE INCLUDING: YOUR NAME DATE OF BIRTH CALL BACK NUMBER REASON FOR CALL**this is important as we prioritize the call backs  YOU WILL RECEIVE A CALL BACK THE SAME DAY AS LONG AS YOU CALL BEFORE 4:00 PM  At the [facility], you and your health needs are our priority. As part of our continuing mission to provide you with exceptional heart care, we have created designated Provider Care Teams. These Care Teams include your primary Cardiologist (physician) and Advanced Practice Providers (APPs- Physician Assistants and Nurse Practitioners) who all work together to provide you with the care you need, when you need it.   You may see any of the following providers on your designated Care Team at your next follow up: Dr Toribio Fuel Dr Ezra Shuck Dr. Morene Brownie Greig Mosses, NP Caffie Shed, [place] [facility] [place], [place] Beckey Coe, NP Jordan Lee, NP Ellouise Class, NP Tinnie Redman, PharmD Jaun Bash, PharmD   Please be sure to bring in all your medications bottles to every appointment.    Thank you for choosing [facility]

## 2024-08-23 ENCOUNTER — Ambulatory Visit: Attending: Cardiology

## 2024-08-23 DIAGNOSIS — Z95810 Presence of automatic (implantable) cardiac defibrillator: Secondary | ICD-10-CM

## 2024-08-23 DIAGNOSIS — I5042 Chronic combined systolic (congestive) and diastolic (congestive) heart failure: Secondary | ICD-10-CM

## 2024-08-24 NOTE — Progress Notes (Signed)
 EPIC Encounter for ICM Monitoring  Patient Name: Justin [name] is a 89 y.o. male Date: 08/24/2024 Primary Care Physican: Mast, Man X, NP Primary Cardiologist: [facility]/HF clinic Electrophysiologist:  [name] Pore Pacing: 65.9%  09/27/2022 Weight: 270 lbs 02/12/2023 Weight: 261 lbs 07/07/2023 Office Weight: 261 lbs 10/07/2023 Weight: 245-250 lbs 10/24/2023 Office Weight: 252 lbs 03/01/2024 Office Weight: 252 lbs 03/17/2024 Office Weight: 253 lbs 05/25/2024 Weight: 259 lbs 05/27/2024 Weight: 257 lbs after taking 05/26/2024 Metolazone  07/15/2024 Office Weight: 269.6 lbs 07/16/2024 Office Weight: 263 lbs   Since 13-Aug-2024 Time in AT/AF  24.0 hr/day (100.0%)   Spoke with daughter Justin [name] and heart failure questions reviewed.  Transmission results reviewed.  Pt has been feeling well since 08/13/2024 HF clinic visit where he was given IV lasix .     Diet: Restricting salt intake   Since 08/09/2024 ICM Remote Transmission (see phone note): Optivol thoracic impedance suggesting possible fluid accumulation from 08/06/2024-08/16/2024.    Prescribed: Torsemide  20 mg take 4 tablets (80 mg total) by mouth twice a day   Potassium 20 mEq take 4 tablet(s) (80 mEq total) by mouth twice a day. Additional 40 mEq with each Metolazone  dose.     Metolazone  5 mg Take 1 tablet by mouth every Wednesday with 40 mEq of Potassium with each Metolazone  dose  Spironolactone  25 mg take 1 tablet daily   Labs: 08/13/2024 Creatinine 2.00, BUN 32, Potassium 3.6, Sodium 138, GFR  08/05/2024 Creatinine 1.96, BUN 43, Potassium 3.4, Sodium 136, GFR 32 08/04/2024 Creatinine 2.33, BUN 54, Potassium 3.6, Sodium 136, GFR 26 A complete set of results can be found in Results Review.   Recommendations:  No changes and encouraged to call if pt is experiencing any fluid symptoms.   Follow-up plan: ICM clinic phone appointment on 09/13/2024 to recheck fluid levels.  91 day device clinic remote transmission 08/31/2024.      EP/Cardiology Office Visits:     09/01/2024 with Dr Zenaida.  10/27/2024 with Dr [name].   Copy of ICM check sent to Dr [name].      Remote monitoring is medically necessary for Heart Failure Management.    Daily Thoracic Impedance ICM trend: 05/24/2024 through 08/23/2024.    12-14 Month Thoracic Impedance ICM trend:     Justin GORMAN Garner, RN 08/24/2024 4:25 PM

## 2024-08-27 ENCOUNTER — Non-Acute Institutional Stay: Payer: Self-pay | Admitting: Family Medicine

## 2024-08-27 ENCOUNTER — Encounter: Payer: Self-pay | Admitting: Family Medicine

## 2024-08-27 DIAGNOSIS — M1A0490 Idiopathic chronic gout, unspecified hand, without tophus (tophi): Secondary | ICD-10-CM | POA: Diagnosis not present

## 2024-08-27 NOTE — Assessment & Plan Note (Addendum)
 With history of gout and acute presentation today of sudden onset of right knee pain with no fever but redness and warmth likely is an acute flare of gout.  Less likely diagnosis would be septic joint.  Patient has chronic kidney disease, stage IIIa so would probably not be appropriate to use NSAIDs. Plan begin prednisone  40 mg a day for 5 days with rapid taper for over the next 3 days Hold allopurinol  and restart after flare has resolved

## 2024-08-27 NOTE — Progress Notes (Signed)
 " Provider:  Garnette Pinal, MD Location:      Place of Service:     PCP: Mast, Man X, NP Patient Care Team: Mast, Man X, NP as PCP - General (Internal Medicine) [name], MD as PCP - Cardiology (Cardiology) [name] Chew, MD as PCP - Electrophysiology (Cardiology) Nieves Cough, MD as Consulting Physician (Urology) Liane Sharyne MATSU, [facility] (Inactive) as Pharmacist (Pharmacist) Leslee Reusing, MD as Consulting Physician (Ophthalmology)  Extended Emergency Contact Information Primary Emergency Contact: [facility] Address: [address]          [place], [place] [address] [place] Phone: [phone] Relation: Daughter Secondary Emergency Contact: Billie Risen Address: [address]          [place], [place] [address] [place] Home Phone: [phone] Mobile Phone: [phone] Relation: Daughter  Code Status:  Goals of Care: Advanced Directive information    08/04/2024    3:14 PM  Advanced Directives  Does patient want to make changes to medical advance directive? No - Patient declined     HPI: Patient is a 89 y.o. male seen today for medical management of  Acute gouty flare.  Patient resides in assisted living [facility].  He was visiting his wife who is a resident in skilled care and informed the nurse that his knee was red swollen and painful.  He has a history of gout and takes allopurinol , but only 100 mg a day prophylactically.  He has had bilateral knee replacements and his only symptoms now are in his right knee.  Even though he has generally good recall he does not recall the last flare he had. Past Medical History:  Diagnosis Date   Age-related macular degeneration, dry, both eyes    Allergic    24/7; 365 days/year; I'm allergic to pollens, dust, all southern grasses/trees, mold, mildue, cats, dogs (01/07/2017)   Anal fissure    Asthma    sees Dr. Alaine    Benign prostatic hypertrophy     (sees Dr. Colan   CAD (coronary artery disease)    a. s/p CABG 2007. b. Cath 08/2016 - 4/4 patent grafts.   Carotid bruit    carotid u/s 10/10: 0.39% bilaterally   Chronic combined systolic and diastolic CHF (congestive heart failure) (HCC)    Complication of anesthesia 1980s   w/anal cyst OR, he gave me a saddle block then put a narcotic in spinal cord; had a severe reaction to that (01/07/2017)   Congestive heart failure (CHF) (HCC)    Coronavirus infection 11/18/2023   ED (erectile dysfunction)    Family history of adverse reaction to anesthesia    daughter wakes up during OR (01/07/2017)   GERD (gastroesophageal reflux disease)    Gout    HTN (hypertension)    Hx of colonic polyps    (sees Dr. Abran)   Hyperlipidemia    Hypothyroidism    Moderate to severe aortic stenosis    a. s/p TAVR 02/2017.   Myocardial infarction [name]) ~ 2000   Obesity    Osteoarthritis    was in my knees, hands (01/07/2017 )   PAF (paroxysmal atrial fibrillation) (HCC)    a. documented post TAVR.   Precancerous skin lesion    (sees Dr. 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   29 mm Edwards Sapien 3 transcatheter heart valve placed via percutaneous right  transfemoral approach   Past Surgical History:  Procedure Laterality Date   ANUS SURGERY     opened it back up cause it wouldn't heal; wound up w/a fissure (01/07/2017)   BIV ICD INSERTION CRT-D N/A 05/02/2017   Procedure: BIV ICD INSERTION CRT-D;  Surgeon: [name], MD;  Location: [facility] INVASIVE CV LAB;  Service: Cardiovascular;  Laterality: N/A;   CARDIAC CATHETERIZATION  10/29/2005   CARDIAC CATHETERIZATION N/A 08/27/2016   Procedure: Right/Left Heart Cath and Coronary/Graft Angiography;  Surgeon: Lonni JONETTA Cash, MD;  Location: [facility] INVASIVE CV LAB;  Service: Cardiovascular;  Laterality: N/A;   CARDIOVERSION N/A 04/24/2021   Procedure:  CARDIOVERSION;  Surgeon: Okey Vina GAILS, MD;  Location: [facility] ENDOSCOPY;  Service: Cardiovascular;  Laterality: N/A;   CARDIOVERSION N/A 11/08/2021   Procedure: CARDIOVERSION;  Surgeon: Mona Vinie BROCKS, MD;  Location: [facility] ENDOSCOPY;  Service: Cardiovascular;  Laterality: N/A;   CARDIOVERSION N/A 02/06/2022   Procedure: CARDIOVERSION;  Surgeon: Delford Maude BROCKS, MD;  Location: [facility] ENDOSCOPY;  Service: Cardiovascular;  Laterality: N/A;   CATARACT EXTRACTION W/ INTRAOCULAR LENS  IMPLANT, BILATERAL Bilateral    CATARACT EXTRACTION, BILATERAL  2012   COLONOSCOPY  06/30/2008   no repeats needed    COLONOSCOPY     had 3 or 4 in the past    CORONARY ARTERY BYPASS GRAFT  2007   CABG X4   CYST EXCISION PERINEAL  1980s   HAMMER TOE SURGERY Bilateral    ICD GENERATOR CHANGEOUT N/A 05/30/2023   Procedure: ICD GENERATOR CHANGEOUT;  Surgeon: [name], MD;  Location: [facility] INVASIVE CV LAB;  Service: Cardiovascular;  Laterality: N/A;   JOINT REPLACEMENT     KNEE ARTHROPLASTY  07/30/2011   Procedure: COMPUTER ASSISTED TOTAL KNEE ARTHROPLASTY;  Surgeon: Cordella Glendia Hutchinson;  Location: [facility] OR;  Service: Orthopedics;  Laterality: Left;  left total knee arthroplasty   MASTECTOMY SUBCUTANEOUS Bilateral    MULTIPLE TOOTH EXTRACTIONS     ORIF FINGER / THUMB FRACTURE Right ~ 1980   repair of thumb injury   PROSTATE BIOPSY     REPLACEMENT TOTAL KNEE BILATERAL Bilateral 2012   TEE WITHOUT CARDIOVERSION N/A 03/04/2017   Procedure: TRANSESOPHAGEAL ECHOCARDIOGRAM (TEE);  Surgeon: Cash Lonni JONETTA, MD;  Location: [facility] OR;  Service: Open Heart Surgery;  Laterality: N/A;   TOTAL KNEE REVISION Right 05/18/2019   Procedure: RIGHT PATELLA REVISION/REMOVAL;  Surgeon: Hutchinson Cordella Glendia, MD;  Location: [facility] OR;  Service: Orthopedics;  Laterality: Right;   TRANSCATHETER AORTIC VALVE REPLACEMENT, TRANSFEMORAL N/A 03/04/2017   Procedure: TRANSCATHETER AORTIC VALVE REPLACEMENT, TRANSFEMORAL;  Surgeon: Cash Lonni JONETTA, MD;   Location: [facility] OR;  Service: Open Heart Surgery;  Laterality: N/A;    reports that he quit smoking about 63 years ago. His smoking use included cigarettes. He started smoking about 76 years ago. He has a 45.5 pack-year smoking history. He has never used smokeless tobacco. He reports that he does not currently use alcohol after a past usage of about 7.0 standard drinks of alcohol per week. He reports that he does not use drugs. Social History   Socioeconomic History   Marital status: Married    Spouse name: Not on file   Number of children: 3   Years of education: Not on file   Highest education level: Not on file  Occupational History   Occupation: product/process development scientist    Comment: builds malls  Tobacco Use   Smoking status: Former    Current packs/day: 0.00  Average packs/day: 3.5 packs/day for 13.0 years (45.5 ttl pk-yrs)    Types: Cigarettes    Start date: 67    Quit date: 1963    Years since quitting: 63.0   Smokeless tobacco: Never   Tobacco comments:    Former smoker 04/19/21  Vaping Use   Vaping status: Never Used  Substance and Sexual Activity   Alcohol use: Not Currently    Alcohol/week: 7.0 standard drinks of alcohol    Types: 7 Cans of beer per week    Comment: 1 beer daily after 5pm (04/19/21)   Drug use: No   Sexual activity: Never  Other Topics Concern   Not on file  Social History Narrative   FH of CAD, Male 1st degree relative less than age 36.   Social Drivers of Health   Tobacco Use: Medium Risk (08/13/2024)   Patient History    Smoking Tobacco Use: Former    Smokeless Tobacco Use: Never    Passive Exposure: Not on file  Financial Resource Strain: Low Risk (03/31/2023)   Overall Financial Resource Strain (CARDIA)    Difficulty of Paying Living Expenses: Not very hard  Food Insecurity: Patient Declined (08/05/2024)   Epic    Worried About Programme Researcher, Broadcasting/film/video in the Last Year: Patient declined    Barista in the Last Year: Patient declined   Transportation Needs: Patient Declined (08/05/2024)   Epic    Lack of Transportation (Medical): Patient declined    Lack of Transportation (Non-Medical): Patient declined  Physical Activity: Sufficiently Active (03/31/2023)   Exercise Vital Sign    Days of Exercise per Week: 7 days    Minutes of Exercise per Session: 30 min  Stress: No Stress Concern Present (03/31/2023)   [employer] of Occupational Health - Occupational Stress Questionnaire    Feeling of Stress : Not at all  Social Connections: Patient Declined (08/05/2024)   Social Connection and Isolation Panel    Frequency of Communication with Friends and Family: Patient declined    Frequency of Social Gatherings with Friends and Family: Patient declined    Attends Religious Services: Patient declined    Active Member of Clubs or Organizations: Patient declined    Attends Banker Meetings: Patient declined    Marital Status: Patient declined  Intimate Partner Violence: Patient Declined (08/05/2024)   Epic    Fear of Current or Ex-Partner: Patient declined    Emotionally Abused: Patient declined    Physically Abused: Patient declined    Sexually Abused: Patient declined  Depression (PHQ2-9): Low Risk (04/05/2024)   Depression (PHQ2-9)    PHQ-2 Score: 0  Alcohol Screen: Low Risk (03/31/2023)   Alcohol Screen    Last Alcohol Screening Score (AUDIT): 0  Housing: Unknown (08/05/2024)   Epic    Unable to Pay for Housing in the Last Year: Patient declined    Number of Times Moved in the Last Year: 0    Homeless in the Last Year: Patient declined  Utilities: Not At Risk (11/18/2023)   AHC Utilities    Threatened with loss of utilities: No  Health Literacy: Adequate Health Literacy (03/31/2023)   B1300 Health Literacy    Frequency of need for help with medical instructions: Never    Functional Status Survey:    Family History  Problem Relation Age of Onset   Heart attack Father 70   Allergic rhinitis Father     Asthma Father    Heart failure Mother 68  Uterine cancer Mother    Breast cancer Mother    Colon cancer Neg Hx    Esophageal cancer Neg Hx     Health Maintenance  Topic Date Due   COVID-19 Vaccine (7 - 2025-26 season) 04/26/2024   Influenza Vaccine  11/23/2024 (Originally 03/26/2024)   HEMOGLOBIN A1C  02/02/2025   OPHTHALMOLOGY EXAM  03/16/2025   FOOT EXAM  04/05/2025   Medicare Annual Wellness (AWV)  04/05/2025   DTaP/Tdap/Td (3 - Td or Tdap) 10/28/2030   Pneumococcal Vaccine: 50+ Years  Completed   Zoster Vaccines- Shingrix  Completed   Meningococcal B Vaccine  Aged Out   Hepatitis B Vaccines 19-59 Average Risk  Discontinued    Allergies[1]  Outpatient Encounter Medications as of 08/27/2024  Medication Sig   albuterol  (VENTOLIN  HFA) 108 (90 Base) MCG/ACT inhaler Inhale 2 puffs into the lungs every 4 (four) hours as needed for wheezing or shortness of breath.   allopurinol  (ZYLOPRIM ) 100 MG tablet Take 1 tablet (100 mg total) by mouth daily.   amiodarone  (PACERONE ) 200 MG tablet Take 1 tablet (200 mg total) by mouth daily.   aspirin  81 MG chewable tablet Chew 81 mg by mouth daily.   atorvastatin  (LIPITOR) 20 MG tablet Take 20 mg by mouth daily.   cetirizine (ZYRTEC) 10 MG tablet Take 5 mg by mouth daily.   dabigatran  (PRADAXA ) 150 MG CAPS capsule TAKE (1) CAPSULE TWICE DAILY.   diclofenac  Sodium (VOLTAREN ) 1 % GEL Apply 4 g topically 3 (three) times daily as needed (pain). (Patient taking differently: Apply 1 Application topically 2 (two) times daily. apply to right lateral knee)   docusate sodium  (COLACE) 100 MG capsule Take 100 mg by mouth in the morning and at bedtime.   fluticasone  (FLONASE ) 50 MCG/ACT nasal spray Place 2 sprays into both nostrils daily.   levothyroxine  (SYNTHROID ) 175 MCG tablet Take 175 mcg by mouth daily before breakfast.   metoCLOPramide  (REGLAN ) 10 MG tablet Take 10 mg by mouth 2 (two) times daily before a meal.   metolazone  (ZAROXOLYN ) 2.5 MG tablet  Take 1 tablet (2.5 mg total) by mouth once a week. Every WednesdayTake 40 mEq of Potassium with the metolazone    metoprolol  succinate (TOPROL -XL) 100 MG 24 hr tablet Take 0.5 tablets (50 mg total) by mouth at bedtime.   montelukast  (SINGULAIR ) 10 MG tablet TAKE ONE TABLET BY MOUTH ONCE DAILY IN THE EVENING (Patient taking differently: Take 10 mg by mouth at bedtime.)   Multiple Vitamins-Minerals (MULTIVITAMIN GUMMIES MENS) CHEW Chew 2 each by mouth daily.   oxyCODONE  (OXY IR/ROXICODONE ) 5 MG immediate release tablet Take 5 mg by mouth every 8 (eight) hours as needed (knee pain).   OXYGEN Inhale 2 L/min into the lungs continuous.   pantoprazole  (PROTONIX ) 40 MG tablet Take 40 mg by mouth in the morning and at bedtime.   potassium chloride  SA (KLOR-CON  M) 20 MEQ tablet Take 2 tablets (40 mEq total) by mouth 2 (two) times daily.   spironolactone  (ALDACTONE ) 25 MG tablet Take 1 tablet (25 mg total) by mouth daily. (Patient taking differently: Take 25 mg by mouth See admin instructions. Give 1 tablet (25mg ) by mouth in the afternoon at 1600.)   tamsulosin (FLOMAX) 0.4 MG CAPS capsule Take 0.4 mg by mouth at bedtime.   Tiotropium Bromide  Monohydrate (SPIRIVA  RESPIMAT) 2.5 MCG/ACT AERS Inhale 2 puffs into the lungs daily.   torsemide  (DEMADEX ) 20 MG tablet Take 4 tablets (80 mg total) by mouth 2 (two) times daily.  UNABLE TO FIND Inhale 1 Device into the lungs at bedtime. CPAP; Apply CPAP mask at bedtime.   venlafaxine  XR (EFFEXOR -XR) 37.5 MG 24 hr capsule Take 37.5 mg by mouth daily.   No facility-administered encounter medications on file as of 08/27/2024.    Review of Systems  Constitutional: Negative.   HENT: Negative.    Respiratory: Negative.    Cardiovascular: Negative.   Genitourinary: Negative.   Musculoskeletal:  Positive for arthralgias.  Neurological: Negative.   Psychiatric/Behavioral: Negative.    All other systems reviewed and are negative.   There were no vitals filed for this  visit. There is no height or weight on file to calculate BMI. Physical Exam Vitals reviewed.  Constitutional:      Appearance: Normal appearance.  Cardiovascular:     Rate and Rhythm: Normal rate and regular rhythm.  Pulmonary:     Effort: Pulmonary effort is normal.  Musculoskeletal:     Comments: R knee; red and warm to touch Scar present(S/P replacement) Afebrile  Neurological:     General: No focal deficit present.     Mental Status: He is alert and oriented to person, place, and time.  Psychiatric:        Mood and Affect: Mood normal.        Behavior: Behavior normal.     Labs reviewed: Basic Metabolic Panel: Recent Labs    11/25/23 0728 11/26/23 0417 11/27/23 0410 12/16/23 0000 08/04/24 0246 08/05/24 0227 08/13/24 1248  NA 134* 134* 134*   < > 136 136 138  K 4.0 3.8 3.8   < > 3.6 3.4* 3.6  CL 98 97* 96*   < > 98 100 97*  CO2 27 27 29    < > 27 29 29   GLUCOSE 87 94 106*   < > 102* 120* 98  BUN 41* 43* 46*   < > 54* 43* 32*  CREATININE 1.13 1.11 1.32*   < > 2.33* 1.96* 2.00*  CALCIUM  7.8* 7.8* 7.7*   < > 7.9* 8.2* 8.8*  MG 2.7* 2.6* 2.5*  --   --   --   --    < > = values in this interval not displayed.   Liver Function Tests: Recent Labs    01/20/24 1702 03/16/24 0000 08/03/24 1200 08/04/24 0246  AST 66* 26 26 25   ALT 31 15 15 15   ALKPHOS 130* 155* 87 83  BILITOT 0.7  --  1.0 1.0  PROT 6.9  --  6.3* 6.2*  ALBUMIN  2.6* 3.0* 2.3* 2.2*   No results for input(s): LIPASE, AMYLASE in the last 8760 hours. No results for input(s): AMMONIA in the last 8760 hours. CBC: Recent Labs    12/16/23 0000 01/20/24 1702 08/03/24 1200 08/04/24 0246 08/05/24 0227 08/13/24 1248  WBC 9.7 17.7* 11.8* 12.3* 11.9* 13.2*  NEUTROABS 5,645.00 12.9* 7.9*  --   --   --   HGB 12.8* 14.0 11.6* 11.2* 10.7* 11.8*  HCT 41 44.2 35.7* 34.9* 33.6* 36.2*  MCV  --  87.9 88.8 88.1 87.5 88.7  PLT 211 345 272 273 264 300   Cardiac Enzymes: Recent Labs    11/22/23 0231   CKTOTAL 29*  CKMB 4.0   BNP: Invalid input(s): POCBNP Lab Results  Component Value Date   HGBA1C 6.2 (H) 08/04/2024   Lab Results  Component Value Date   TSH 1.969 08/03/2024   Lab Results  Component Value Date   VITAMINB12 503 10/14/2023   No results found for:  FOLATE Lab Results  Component Value Date   IRON 151 06/30/2023   TIBC 400 06/30/2023   FERRITIN 28 06/30/2023    Imaging and Procedures obtained prior to SNF admission: No results found.  Assessment/Plan Assessment & Plan Chronic gout of hand, unspecified cause, unspecified laterality With history of gout and acute presentation today of sudden onset of right knee pain with no fever but redness and warmth likely is an acute flare of gout.  Less likely diagnosis would be septic joint.  Patient has chronic kidney disease, stage IIIa so would probably not be appropriate to use NSAIDs. Plan begin prednisone  40 mg a day for 5 days with rapid taper for over the next 3 days Hold allopurinol  and restart after flare has resolved  Family/ staff Communication:   Labs/tests ordered:  .smmsi    [1]  Allergies Allergen Reactions   Peanut (Diagnostic) Anaphylaxis   Peanut-Containing Drug Products Anaphylaxis   Sulfa Antibiotics Anaphylaxis   Sulfonamide Derivatives Anaphylaxis   Eliquis  [Apixaban ] Other (See Comments)    Back/hip pain   Norvasc  [Amlodipine ] Swelling    *lower extremity swelling   Xarelto  [Rivaroxaban ] Other (See Comments)    Back/hip pain   Zestril  [Lisinopril ] Cough   Lactose Other (See Comments)    Unknown reaction   "

## 2024-08-30 ENCOUNTER — Ambulatory Visit (INDEPENDENT_AMBULATORY_CARE_PROVIDER_SITE_OTHER)

## 2024-08-30 ENCOUNTER — Encounter: Payer: Self-pay | Admitting: Primary Care

## 2024-08-30 ENCOUNTER — Ambulatory Visit: Admitting: Primary Care

## 2024-08-30 ENCOUNTER — Ambulatory Visit: Payer: Self-pay | Admitting: Primary Care

## 2024-08-30 VITALS — BP 116/60 | HR 87 | Temp 97.6°F | Ht 71.0 in | Wt 266.0 lb

## 2024-08-30 DIAGNOSIS — Z8709 Personal history of other diseases of the respiratory system: Secondary | ICD-10-CM

## 2024-08-30 DIAGNOSIS — M109 Gout, unspecified: Secondary | ICD-10-CM | POA: Diagnosis not present

## 2024-08-30 DIAGNOSIS — J449 Chronic obstructive pulmonary disease, unspecified: Secondary | ICD-10-CM

## 2024-08-30 DIAGNOSIS — J90 Pleural effusion, not elsewhere classified: Secondary | ICD-10-CM

## 2024-08-30 DIAGNOSIS — J4520 Mild intermittent asthma, uncomplicated: Secondary | ICD-10-CM

## 2024-08-30 DIAGNOSIS — G4733 Obstructive sleep apnea (adult) (pediatric): Secondary | ICD-10-CM

## 2024-08-30 DIAGNOSIS — J9611 Chronic respiratory failure with hypoxia: Secondary | ICD-10-CM

## 2024-08-30 DIAGNOSIS — Z87891 Personal history of nicotine dependence: Secondary | ICD-10-CM | POA: Diagnosis not present

## 2024-08-30 DIAGNOSIS — I5022 Chronic systolic (congestive) heart failure: Secondary | ICD-10-CM

## 2024-08-30 DIAGNOSIS — I509 Heart failure, unspecified: Secondary | ICD-10-CM | POA: Diagnosis not present

## 2024-08-30 LAB — BASIC METABOLIC PANEL WITH GFR
BUN: 43 mg/dL — ABNORMAL HIGH (ref 6–23)
CO2: 31 meq/L (ref 19–32)
Calcium: 8.5 mg/dL (ref 8.4–10.5)
Chloride: 92 meq/L — ABNORMAL LOW (ref 96–112)
Creatinine, Ser: 1.65 mg/dL — ABNORMAL HIGH (ref 0.40–1.50)
GFR: 36.93 mL/min — ABNORMAL LOW
Glucose, Bld: 90 mg/dL (ref 70–99)
Potassium: 3.7 meq/L (ref 3.5–5.1)
Sodium: 134 meq/L — ABNORMAL LOW (ref 135–145)

## 2024-08-30 NOTE — Patient Instructions (Addendum)
" °  VISIT SUMMARY: During your visit, we discussed your recent chest constriction and cough, which you believe may be related to your allergies. We reviewed your history of sleep apnea, chronic respiratory failure, and your current medication regimen. We also addressed your fluid retention and recent weight changes.  YOUR PLAN: -CHRONIC OBSTRUCTIVE PULMONARY DISEASE: Chronic obstructive pulmonary disease (COPD) is a chronic inflammatory lung disease that causes obstructed airflow from the lungs. You reported chest constriction and congestion without a productive cough. Your lungs are clear but with diminished breath sounds. We have ordered a chest x-ray to evaluate the congestion, and you should continue using your Spiriva  inhaler once daily. Additionally, you are prescribed Mucinex  DM twice daily for 5 days, then as needed.  -CHRONIC RESPIRATORY FAILURE WITH HYPOXEMIA AND OBSTRUCTIVE SLEEP APNEA: Chronic respiratory failure with hypoxemia means your lungs are not getting enough oxygen into your blood. Obstructive sleep apnea is a condition where your breathing stops and starts during sleep. You use a CPAP machine every night but often remove it after a few hours due to discomfort. We encourage you to use the CPAP consistently throughout the night and continue using oxygen with the CPAP at night.  -PLEURAL EFFUSION: Pleural effusion is the buildup of excess fluid between the layers of the pleura outside the lungs. Your previous right-sided pleural effusion has largely resolved after treatment with antibiotics and thoracentesis.  INSTRUCTIONS: Please follow up with the chest x-ray as ordered. Continue using your Spiriva  inhaler daily and take Mucinex  DM as prescribed. Use your CPAP machine consistently throughout the night and continue using oxygen with it. Monitor your weight daily and report any significant changes.   Follow-up 3 months with Dr. Neda  "

## 2024-08-30 NOTE — Telephone Encounter (Signed)
 Please let patient know CXR showed small chronic bilateral pleural effusion, continue diuretics as directed. No indication for repeat thoracentesis. O2 levels were normal on room air.

## 2024-08-30 NOTE — Progress Notes (Signed)
 "  @Patient  ID: Justin [name], male    DOB: 09-19-1933, 89 y.o.   MRN: [id number]  Chief Complaint  Patient presents with   Sleep Apnea   COPD   respiratory failure    Congested in chest for 2 days.  Blowing nose, denies any fever, chills or body aches.    Referring provider: Mast, Man X, NP  HPI: 89 year old, former smoker. PMH significant for CHF, CAD, MI, aortic stenosis s/p TAVR, afib, hypothyroidism, type 2 diabetes, irritable larynx syndrome, asthma, viral pneumonia, CKD, CABG x4, hyperlipidemia.  Previous [facility] pulmonary encounter:  04/14/2024 Discussed the use of AI scribe software for clinical note transcription with the patient, who gave verbal consent to proceed.  History of Present Illness Justin [name] is an 89 year old male with mild obstructive lung disease and severe sleep apnea who presents for follow-up regarding his respiratory status and CPAP compliance.  He had a breathing test that showed 80% lung function. He was started on Spiriva  Respimat due to his smoking history, having quit smoking 62 years ago after smoking three and a half packs a day. He uses the inhaler once a day but experiences choking. No significant change in breathing or energy levels has been noted. He manages short walks but experiences heavy breathing, which he attributes to deep breathing exercises learned in therapy.  He underwent a sleep study and uses an auto CPAP machine with oxygen connected at night. He uses the CPAP from around 9:30-10:00 PM until 4-5 AM, achieving more than four hours of usage per night. There was a break in therapy for about two weeks in July due to pain from a tooth extraction, which prevented CPAP use. He wakes up frequently to urinate, attributed to diuretic use, but returns to sleep quickly. He feels he is getting restful sleep despite these interruptions.  He has experienced a weight gain of approximately 13 pounds since June. His current medications include  torsemide  80 mg in the morning and 40 mg in the evening, potassium 80 mg twice a day, metolazone  2.5 mg on Saturdays with extra potassium, spironolactone  25 mg daily, Entresto  twice a day, and metoprolol . His daughter notes that he is always 'a few days away from a hospital visit' due to fluid issues.  Airview download 03/15/24-04/13/24 Usage days 20/30 days (67%) Average usage days used 6 hours 28 mins Pressure 5-20cm h20 (10.2cm h20-95%) Airleaks 17.7L/min (95%) AHI 1.0     1. Chronic systolic heart failure (HCC) (Primary) - B Nat Peptide - Basic Metabolic Panel (BMET)   2. OSA (obstructive sleep apnea)   Assessment & Plan Heart failure with fluid overload Recent weight gain of approximately 10-13 pounds indicates fluid retention along with trace-+1 BLE pitting edema. Recent adjustment of diuretics by cardiologist, but fluid retention persists. No significant dietary changes reported. - Continue Torsemide  80mg  every morning and 40mg  every evening; spirolactone 25mg  daily and zaroxolyn  once a week on Saturday  - Order lab work to assess kidney function and BNP - Consider adjustment of diuretics based on lab results   Severe obstructive sleep apnea on CPAP and nocturnal oxygen Severe obstructive sleep apnea well-controlled with CPAP and nocturnal oxygen. Compliance with CPAP usage is 67%, needs to be over 70%. Recent break in therapy due to tooth extraction, but compliance has resumed. CPAP effectively reduces apneic events to less than one per hour. - Continue CPAP and nocturnal oxygen - Schedule virtual follow-up in 4-6 weeks to reassess compliance -  Document tooth extraction as reason for break in therapy   Mild chronic obstructive pulmonary disease (COPD) Mild obstructive lung disease based on curvature to flow volume loop on spirometry, FEV1 80%. Former heavy smoker, quit 62 years ago. Currently using Spiriva  Respimat 2.5mcg once daily. No significant change in breathing or energy  levels reported. - Continue Spiriva  Respimat 2.5mcg once daily for now. We can consider discontinue at follow-up if no improvement with daily BD. Dyspne largely related to heart failure and obesity.    Chronic hypoxemic respiratory failure on supplemental oxygen Chronic hypoxemic respiratory failure managed with supplemental oxygen. Oxygen used consistently with CPAP at night. Oxygen saturation levels are stable with deep breathing exercises. O2 97% RA today. Advised to use oxygen during moderate exertion to maintain O2 level >88-90% - Continue supplemental oxygen use, especially during exertion    05/26/2024 Discussed the use of AI scribe software for clinical note transcription with the patient, who gave verbal consent to proceed.  History of Present Illness Justin [name] is an 89 year old male with congestive heart failure and COPD who presents for follow-up regarding fluid management and respiratory status.  He has a history of congestive heart failure and is currently experiencing fluid retention. His weight has fluctuated recently, with readings of 259 lbs two days ago, 257 lbs yesterday, and 259 lbs today. He is on torsemide , previously taking 80 mg in the morning and 40 mg in the evening, with an additional dose on Saturdays. Recently, an extra dose of torsemide  was added to his regimen due to weight gain, and he is also taking 40 MEQs of potassium. He experiences increased urination, especially after the additional dose, with frequency every 30 minutes during the day and hourly at night.  He has a history of sleep apnea and uses a CPAP machine with supplemental oxygen at night. His CPAP usage is consistent, with 100% adherence over the last 30 days, and his apnea score is well controlled at 2. His breathing is currently stable, and he has not experienced any significant respiratory distress recently.  He has COPD and is a former smoker, having quit 62 years ago. He is on Spiriva   Respimat once daily for his COPD and uses supplemental oxygen at night or with exertion. No significant exacerbations of his COPD symptoms have occurred recently.  He is on a low-salt diet, coordinated with the assistance of a dietitian and the kitchen staff at his residence, to manage his heart failure. He actively monitors his salt intake and has had discussions with the food director and dietitian to ensure compliance with dietary recommendations.  No wheezing or significant respiratory distress. Breathing is stable without exacerbations of COPD symptoms.    08/30/2024- interim hx  Discussed the use of AI scribe software for clinical note transcription with the patient, who gave verbal consent to proceed.  History of Present Illness Justin [name] is an 89 year old male with sleep apnea and chronic respiratory failure who presents with chest constriction and cough.  Followed by our office for OSA, chronic respiratory failure, hx pneumonia and pleural effusion. S/p thoracentesis in March 2025. Right sided infiltrate largelt resolved on CT imaging in June. Maintained on CPAP, spiriva  respimat and 2L supplemental oxygen at bedtime and as needed.    He experienced chest constriction last night and has had a cough for the past few days. He can breathe through his nose and out through his mouth without difficulty and is not producing any  sputum. He describes the sensation as similar to catching a cold but was able to clear his nasal passages this morning. He attributes some of his symptoms to his allergies, which tend to worsen this time of year.  He has a history of sleep apnea and chronic respiratory failure. He uses a CPAP machine nightly but sometimes removes it after three to four hours due to discomfort. He is also on two liters of oxygen as needed. His sleep is disrupted by frequent urination, as he gets up every hour at night.  His medication regimen includes amiodarone , aspirin ,  atorvastatin , Pradaxa , metoprolol , spironolactone , torsemide , and potassium. He is currently on a steroid for a gout flare in his right knee and uses a nasal spray and inhaler daily. He also takes Singulair  for allergies and uses a Spiriva  inhaler once a day.  He was admitted in December for hypotension secondary to acute renal failure from over-diuresis of his CHF. During that admission, his cardiac medications were adjusted, and he was advised to discontinue Imdur , metolazone , and Entresto . However, he resumed metolazone  2.5 mg on Wednesdays following a cardiology visit on December 19th due to volume overload and a weight increase of nine pounds in one week. His weight has since decreased from 272 pounds on December 19th to 266 pounds today.  He reports that he retains fluid in his stomach and sometimes in the back of his legs. He is being weighed daily and is maintaining his current weight.   Allergies[1]  Immunization History  Administered Date(s) Administered   Fluad Quad(high Dose 65+) 05/06/2019, 05/30/2020, 06/18/2021, 05/27/2023, 06/09/2024   Hep A / Hep B 04/06/2012, 10/07/2012   Hepatitis B 05/07/2012   INFLUENZA, HIGH DOSE SEASONAL PF 05/15/2018, 05/31/2022   Influenza Split 06/28/2013   Influenza Whole 06/05/2007, 05/13/2008, 06/06/2009, 05/30/2010   Influenza,inj,Quad PF,6+ Mos 05/20/2016   Influenza-Unspecified 05/25/2014, 07/08/2015, 05/06/2017, 07/29/2023   Moderna Covid-19 Vaccine Bivalent Booster 22yrs & up 05/31/2022   PFIZER(Purple Top)SARS-COV-2 Vaccination 09/28/2019, 10/12/2019, 05/30/2020   PPD Test 03/31/2023   Pfizer Covid-19 Vaccine Bivalent Booster 63yrs & up 11/18/2021, 07/29/2023, 06/23/2024   Pneumococcal Conjugate-13 09/18/2015   Pneumococcal Polysaccharide-23 05/13/2008, 05/25/2014   Td 05/30/2010   Tdap 10/27/2020   Zoster Recombinant(Shingrix) 05/18/2018, 10/16/2018   Zoster, Live 10/21/2007    Past Medical History:  Diagnosis Date   Age-related  macular degeneration, dry, both eyes    Allergic    24/7; 365 days/year; I'm allergic to pollens, dust, all [place] grasses/trees, mold, mildue, cats, dogs (01/07/2017)   Anal fissure    Asthma    sees Dr. Alaine    Benign prostatic hypertrophy    (sees Dr. Colan   CAD (coronary artery disease)    a. s/p CABG 2007. b. Cath 08/2016 - 4/4 patent grafts.   Carotid bruit    carotid u/s 10/10: 0.39% bilaterally   Chronic combined systolic and diastolic CHF (congestive heart failure) (HCC)    Complication of anesthesia 1980s   w/anal cyst OR, he gave me a saddle block then put a narcotic in spinal cord; had a severe reaction to that (01/07/2017)   Congestive heart failure (CHF) (HCC)    Coronavirus infection 11/18/2023   ED (erectile dysfunction)    Family history of adverse reaction to anesthesia    daughter wakes up during OR (01/07/2017)   GERD (gastroesophageal reflux disease)    Gout    HTN (hypertension)    Hx of colonic polyps    (sees Dr. Abran)  Hyperlipidemia    Hypothyroidism    Moderate to severe aortic stenosis    a. s/p TAVR 02/2017.   Myocardial infarction [facility]) ~ 2000   Obesity    Osteoarthritis    was in my knees, hands (01/07/2017 )   PAF (paroxysmal atrial fibrillation) (HCC)    a. documented post TAVR.   Precancerous skin lesion    (sees Dr. Ivin)   Prostate cancer 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 8 g 5   allopurinol  (ZYLOPRIM ) 100  MG tablet Take 1 tablet (100 mg total) by mouth daily. 90 tablet 3   amiodarone  (PACERONE ) 200 MG tablet Take 1 tablet (200 mg total) by mouth daily. 90 tablet 3   aspirin  81 MG chewable tablet Chew 81 mg by mouth daily.     atorvastatin  (LIPITOR) 20 MG tablet Take 20 mg by mouth daily.     cetirizine (ZYRTEC) 10 MG tablet Take 5 mg by mouth daily.     dabigatran  (PRADAXA ) 150 MG CAPS capsule TAKE (1) CAPSULE TWICE DAILY. 180 capsule 2   diclofenac  Sodium (VOLTAREN ) 1 % GEL Apply 4 g topically 3 (three) times daily as needed (pain). 50 g 0   docusate sodium  (COLACE) 100 MG capsule Take 100 mg by mouth in the morning and at bedtime.     fluticasone  (FLONASE ) 50 MCG/ACT nasal spray Place 2 sprays into both nostrils daily.     levothyroxine  (SYNTHROID ) 175 MCG tablet Take 175 mcg by mouth daily before breakfast.     metoCLOPramide  (REGLAN ) 10 MG tablet Take 10 mg by mouth 2 (two) times daily before a meal.     metolazone  (ZAROXOLYN ) 2.5 MG tablet Take 1 tablet (2.5 mg total) by mouth once a week. Every WednesdayTake 40 mEq of Potassium with the metolazone  90 tablet 3   metoprolol  succinate (TOPROL -XL) 100 MG 24 hr tablet Take 0.5 tablets (50 mg total) by mouth at bedtime. 60 tablet 0   montelukast  (SINGULAIR ) 10 MG tablet TAKE ONE TABLET BY MOUTH ONCE DAILY IN THE EVENING 90 tablet 3   Multiple Vitamins-Minerals (MULTIVITAMIN GUMMIES MENS) CHEW Chew 2 each by mouth daily.     oxyCODONE  (OXY IR/ROXICODONE ) 5 MG immediate release tablet Take 5 mg by mouth every 8 (eight) hours as needed (knee pain).     OXYGEN Inhale 2 L/min into the lungs continuous.     pantoprazole  (PROTONIX ) 40 MG tablet Take 40 mg by mouth in the morning and at bedtime.     potassium chloride  SA (KLOR-CON  M) 20 MEQ tablet Take 2 tablets (40 mEq total) by mouth 2 (two) times daily. 120 tablet 1   spironolactone  (ALDACTONE ) 25 MG tablet Take 1 tablet (25 mg total) by mouth daily. 30 tablet 9   tamsulosin (FLOMAX) 0.4 MG CAPS  capsule Take 0.4 mg by mouth at bedtime.     Tiotropium Bromide  Monohydrate (SPIRIVA  RESPIMAT) 2.5 MCG/ACT AERS Inhale 2 puffs into the lungs daily.     torsemide  (DEMADEX ) 20 MG  tablet Take 4 tablets (80 mg total) by mouth 2 (two) times daily. 150 tablet 6   triamcinolone  cream (KENALOG ) 0.5 % Apply 1 Application topically as directed.     UNABLE TO FIND Inhale 1 Device into the lungs at bedtime. CPAP; Apply CPAP mask at bedtime.     venlafaxine  XR (EFFEXOR -XR) 37.5 MG 24 hr capsule Take 37.5 mg by mouth daily.     No facility-administered medications prior to visit.      Review of Systems  Review of Systems  Constitutional: Negative.  Negative for unexpected weight change.  HENT: Negative.    Respiratory:  Positive for cough. Negative for shortness of breath and wheezing.   Cardiovascular:  Negative for leg swelling.   Physical Exam  BP 116/60 (BP Location: Right Arm, Patient Position: Sitting, Cuff Size: Normal)   Pulse 87   Temp 97.6 F (36.4 C) (Oral)   Ht 5' 11 (1.803 m)   Wt 266 lb (120.7 kg)   SpO2 97% Comment: RA  BMI 37.10 kg/m  Physical Exam Constitutional:      General: He is not in acute distress.    Appearance: Normal appearance. He is well-developed. He is obese. He is not ill-appearing.  HENT:     Head: Normocephalic and atraumatic.     Mouth/Throat:     Mouth: Mucous membranes are moist.     Pharynx: Oropharynx is clear.  Cardiovascular:     Rate and Rhythm: Normal rate and regular rhythm.     Heart sounds: Normal heart sounds.  Pulmonary:     Effort: Pulmonary effort is normal. No respiratory distress.     Breath sounds: Normal breath sounds. No wheezing or rhonchi.     Comments: CTA Abdominal:     Comments: Large, soft, distended  Musculoskeletal:        General: Normal range of motion.     Cervical back: Normal range of motion and neck supple.     Right lower leg: No edema.     Left lower leg: No edema.  Skin:    General: Skin is warm and  dry.     Findings: No erythema or rash.  Neurological:     General: No focal deficit present.     Mental Status: He is alert and oriented to person, place, and time. Mental status is at baseline.  Psychiatric:        Mood and Affect: Mood normal.        Behavior: Behavior normal.        Thought Content: Thought content normal.        Judgment: Judgment normal.      Lab Results:  CBC    Component Value Date/Time   WBC 13.2 (H) 08/13/2024 1248   RBC 4.08 (L) 08/13/2024 1248   HGB 11.8 (L) 08/13/2024 1248   HGB 14.0 05/23/2023 1220   HCT 36.2 (L) 08/13/2024 1248   HCT 45.2 05/23/2023 1220   PLT 300 08/13/2024 1248   PLT 415 05/23/2023 1220   MCV 88.7 08/13/2024 1248   MCV 87 05/23/2023 1220   MCH 28.9 08/13/2024 1248   MCHC 32.6 08/13/2024 1248   RDW 16.6 (H) 08/13/2024 1248   RDW 15.9 (H) 05/23/2023 1220   LYMPHSABS 2.0 08/03/2024 1200   LYMPHSABS 2.0 04/23/2017 1012   MONOABS 1.4 (H) 08/03/2024 1200   EOSABS 0.4 08/03/2024 1200   EOSABS 0.3 04/23/2017 1012   BASOSABS 0.1 08/03/2024 1200   BASOSABS 0.1 04/23/2017 1012  BMET    Component Value Date/Time   NA 138 08/13/2024 1248   NA 133 (L) 04/14/2024 1637   K 3.6 08/13/2024 1248   CL 97 (L) 08/13/2024 1248   CO2 29 08/13/2024 1248   GLUCOSE 98 08/13/2024 1248   GLUCOSE 87 06/30/2006 1132   BUN 32 (H) 08/13/2024 1248   BUN 33 (H) 04/14/2024 1637   CREATININE 2.00 (H) 08/13/2024 1248   CREATININE 1.62 (H) 10/14/2023 0729   CALCIUM  8.8 (L) 08/13/2024 1248   GFRNONAA 32 (L) 08/13/2024 1248   GFRAA 66 04/04/2020 1132    BNP    Component Value Date/Time   BNP 674.3 (H) 08/03/2024 1200    ProBNP    Component Value Date/Time   PROBNP 4,624.0 (H) 08/13/2024 1248   PROBNP 2,293 (H) 10/16/2022 1259   PROBNP 354.0 (H) 03/28/2021 0730    Imaging: DG Chest Port 1 View Result Date: 08/03/2024 CLINICAL DATA:  Weakness. EXAM: PORTABLE CHEST 1 VIEW COMPARISON:  01/20/2024. FINDINGS: Cardiomegaly, unchanged.  Status post TAVR, CABG, and median sternotomy. Stable left chest wall AICD. Pulmonary vascular congestion with possible mild interstitial edema. No focal consolidation, sizeable pleural effusion, or pneumothorax. No acute osseous abnormality. IMPRESSION: Cardiomegaly with pulmonary vascular congestion and possible mild interstitial edema. Electronically Signed   By: Harrietta Sherry M.D.   On: 08/03/2024 12:55     Assessment & Plan:   1. Intermittent asthma, unspecified asthma severity, unspecified whether complicated (Primary)  2. Pleural effusion  3. Chronic obstructive pulmonary disease, unspecified COPD type (HCC) - DG Chest 2 View; Future  4. Chronic systolic heart failure (HCC) - Pro b natriuretic peptide; Future - Basic metabolic panel with GFR; Future - Basic metabolic panel with GFR - Pro b natriuretic peptide   Assessment and Plan Assessment & Plan Chronic obstructive pulmonary disease History of tobacco abuse. PFTs in June 2025 showed mild restriction and mild diffusion defect. Curvature noted on flow volume loop. Reports chest constriction and congestion, but no productive cough. Lungs are clear with diminished breath sounds. Continues to use Spiriva  inhaler once daily. - Ordered chest x-ray to evaluate congestion - Continue Spiriva  inhaler once daily - Prescribed Mucinex  DM twice daily for 5 days, then as needed  Chronic respiratory failure with hypoxemia and obstructive sleep apnea Severe obstructive sleep apnea well-controlled with CPAP and nocturnal oxygen. Uses CPAP every night but reports discomfort and removes it after 3.5 to 4 hours. Oxygen is used with CPAP at night. CPAP pressure settings are at 5-20 with an apnea score of 2.6. Reports nocturia, which affects CPAP use. - Encouraged consistent use of CPAP throughout the night - Continue oxygen with CPAP at night  Hx pleural effusion Previous right-sided pleural effusion/CAP largely resolved on CT imaging after  treatment with antibiotics and thoracentesis. Small residual bilateral pleural effusion with associated bibasilar atelectasis noted in June 2025.   Heart failure Admitted in December for hypotension secondary to over diuresis. His cardiac medications were adjusted on discharge, and he was advised to discontinue Imdur , metolazone , and Entresto . Torsemide  was increased to 80mg  BID on 08/06/24 and started back on metolazone  2.5mg  once a week on Wednesdays on 12/19 by cardiology.  - Checking, pro-BNP, bmet and CXR today  Gout -Patient is currently on prednisone  for gout flare   I personally spent a total of 40 minutes in the care of the patient today including performing a medically appropriate exam/evaluation, counseling and educating, placing orders, documenting clinical information in the EHR, and independently interpreting  results.   Almarie LELON Ferrari, NP 08/30/2024     [1]  Allergies Allergen Reactions   Peanut (Diagnostic) Anaphylaxis   Peanut-Containing Drug Products Anaphylaxis   Sulfa Antibiotics Anaphylaxis   Sulfonamide Derivatives Anaphylaxis   Eliquis  [Apixaban ] Other (See Comments)    Back/hip pain   Norvasc  [Amlodipine ] Swelling    *lower extremity swelling   Xarelto  [Rivaroxaban ] Other (See Comments)    Back/hip pain   Zestril  [Lisinopril ] Cough   Lactose Other (See Comments)    Unknown reaction  [2]  Social History Tobacco Use  Smoking Status Former   Current packs/day: 0.00   Average packs/day: 3.5 packs/day for 13.0 years (45.5 ttl pk-yrs)   Types: Cigarettes   Start date: 16   Quit date: 1963   Years since quitting: 63.0  Smokeless Tobacco Never  Tobacco Comments   Former smoker 04/19/21   "

## 2024-08-31 ENCOUNTER — Ambulatory Visit: Payer: Medicare Other

## 2024-08-31 ENCOUNTER — Telehealth (HOSPITAL_COMMUNITY): Payer: Self-pay | Admitting: Cardiology

## 2024-08-31 DIAGNOSIS — I5042 Chronic combined systolic (congestive) and diastolic (congestive) heart failure: Secondary | ICD-10-CM

## 2024-08-31 LAB — PRO B NATRIURETIC PEPTIDE: NT-Pro BNP: 4372 pg/mL — ABNORMAL HIGH (ref 0–486)

## 2024-08-31 NOTE — Progress Notes (Addendum)
 "  ADVANCED HEART FAILURE FOLLOW UP CLINIC NOTE  Referring Physician: Mast, Justin X, NP  Primary Care: Justin [name], Justin X, NP Primary Cardiologist:  HPI: Justin [name] is a 89 y.o. male with permanent atrial fibrillation, aortic stenosis s/p TAVR, medtronic BiV ICD in 2018 after TAVR, CKD, CAD s/p CABG in 2007 who presents for follow up of chronic systolic heart failure.     S/p ICD generator changed 10/24. EF 40-45% at the time. Enrolled in iCM monitoring.   Admitted 3/25 for pneumonia and compressive atelectasis 2/2 COVID and flu infection c/b UTI. S/p thoracentesis 1L. EF 40-45% with mod reduced RV, normal AoV prosthetic. He was discharged to SNF.    Follow up 08/03/24 with Dr. Verlin, BP 65/38, EMS called and he was sent to ED where he was admitted with AKI. GDMT held, SCr 2.56>>1.93. Meds gradually re-started as BP and renal function improved. He remained DNR. He was discharged back to facility, weight 263 lbs.     SUBJECTIVE:  Patient reports that he has not been doing well the past few days. He had to sleep sitting up last night due to shortness of breath. He has been taking his diuretics as scheduled but has been having worsening symptoms. He is accompanied by his daughter. We discussed furosicx as a way to get fluid off. Called his facility and they will review the medication information.   PMH, current medications, allergies, social history, and family history reviewed in epic.  PHYSICAL EXAM: Vitals:   09/01/24 1340  BP: 120/78  Pulse: (!) 114  SpO2: 98%   GENERAL: chronically ill appearing, elderly PULM:  Normal work of breathing, clear to auscultation bilaterally. Respirations are unlabored.  CARDIAC:  JVP: moderately elevated         Irregular rate and rhythm. No murmurs, rubs or gallops.  1+ edema. Warm and well perfused extremities. ABDOMEN: Soft, non-tender, non-distended. NEUROLOGIC: Patient is oriented x3 with no focal or lateralizing neurologic deficits.     DATA REVIEW  ECHO: 11/12/22: LVEF 45-50%, RV function low normal.  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  - Previously seen in clinic, volume up, worsening symptoms - NYHA class IV, hypervolemic today - Unfortunately near end stage, based on initial discussions today not yet considering palliative care - Increase torsemide  to 100mg  daily - Give metolazone  5mg  daily for the next 3 days then increase to 5mg  twice weekly, increase KcL replacement on those days - Bring back in 1-2 weeks to discuss furosicx after facility has a chance to review medication information - Labs at next visit - Continue spironolactone  25mg  daily, metoprolol  50mg  nightly - No SGLT2 with UTI, no RAASi with CKD   Permanent Atrial fibrillation & frequent PVCs  - Follow with EP, enrolled in iCM. - Rate controlled.  - Continue amiodarone  200 mg daily. Follow LFTs and TSH - Continue Pradaxa  150 mg bid. No bleeding issues.   CAD   - CABG in 2007 - LHC in 2018. - No chest pain - Stop aspirin  at next visit - Continue atorvastatin  20 mg daily.   Aortic stenosis  - s/p TAVR - CRT-D placed due to syncope and TAVR.   Hypothyroidism - on synthroid   - Per PCP   Dyspnea -  Likely multifactorial.   - Continue home oxygen - PFTs suggest interstitial process with mild diffusion defect; DLCO 60% pred. Will need to follow annually while on amiodarone    Follow up in 1-2 weeks  I spent 45 minutes caring for this patient today including face to face time, ordering and reviewing labs, discussing subcutaneous furosemide  with facility, seeing the patient, documenting in the record, and arranging follow ups.   Justin Brownie, MD Advanced Heart  Failure Mechanical Circulatory Support 09/06/2024 "

## 2024-08-31 NOTE — Progress Notes (Signed)
 Let patient know pro-bnp was elevated, I think he is seeing cardiology this week. They may need to adjust diuretics

## 2024-08-31 NOTE — Telephone Encounter (Signed)
 Called to confirm/remind patient of their appointment at the [facility] on 08/31/24.   Appointment:   [] Confirmed  [x] Left mess   [] No answer/No voice mail  [] VM Full/unable to leave message  [] Phone not in service  Patient reminded to bring all medications and/or complete list.  Confirmed patient has transportation. Gave directions, instructed to utilize valet parking.

## 2024-09-01 ENCOUNTER — Ambulatory Visit (HOSPITAL_COMMUNITY)
Admission: RE | Admit: 2024-09-01 | Discharge: 2024-09-01 | Disposition: A | Discharge status: Home / Self Care | Source: Ambulatory Visit | Attending: Cardiology | Admitting: Cardiology

## 2024-09-01 ENCOUNTER — Encounter (HOSPITAL_COMMUNITY): Payer: Self-pay | Admitting: Cardiology

## 2024-09-01 VITALS — BP 120/78 | HR 114 | Wt 270.8 lb

## 2024-09-01 DIAGNOSIS — I2510 Atherosclerotic heart disease of native coronary artery without angina pectoris: Secondary | ICD-10-CM | POA: Insufficient documentation

## 2024-09-01 DIAGNOSIS — Z87440 Personal history of urinary (tract) infections: Secondary | ICD-10-CM | POA: Diagnosis not present

## 2024-09-01 DIAGNOSIS — E039 Hypothyroidism, unspecified: Secondary | ICD-10-CM | POA: Diagnosis not present

## 2024-09-01 DIAGNOSIS — Z66 Do not resuscitate: Secondary | ICD-10-CM | POA: Insufficient documentation

## 2024-09-01 DIAGNOSIS — Z7901 Long term (current) use of anticoagulants: Secondary | ICD-10-CM | POA: Insufficient documentation

## 2024-09-01 DIAGNOSIS — I4821 Permanent atrial fibrillation: Secondary | ICD-10-CM | POA: Insufficient documentation

## 2024-09-01 DIAGNOSIS — I493 Ventricular premature depolarization: Secondary | ICD-10-CM | POA: Diagnosis not present

## 2024-09-01 DIAGNOSIS — Z952 Presence of prosthetic heart valve: Secondary | ICD-10-CM | POA: Diagnosis not present

## 2024-09-01 DIAGNOSIS — E8770 Fluid overload, unspecified: Secondary | ICD-10-CM | POA: Insufficient documentation

## 2024-09-01 DIAGNOSIS — I5023 Acute on chronic systolic (congestive) heart failure: Secondary | ICD-10-CM | POA: Insufficient documentation

## 2024-09-01 DIAGNOSIS — Z951 Presence of aortocoronary bypass graft: Secondary | ICD-10-CM | POA: Diagnosis not present

## 2024-09-01 DIAGNOSIS — Z9981 Dependence on supplemental oxygen: Secondary | ICD-10-CM | POA: Diagnosis not present

## 2024-09-01 DIAGNOSIS — Z79890 Hormone replacement therapy (postmenopausal): Secondary | ICD-10-CM | POA: Insufficient documentation

## 2024-09-01 DIAGNOSIS — Z79899 Other long term (current) drug therapy: Secondary | ICD-10-CM | POA: Insufficient documentation

## 2024-09-01 DIAGNOSIS — I5042 Chronic combined systolic (congestive) and diastolic (congestive) heart failure: Secondary | ICD-10-CM

## 2024-09-01 DIAGNOSIS — Z95810 Presence of automatic (implantable) cardiac defibrillator: Secondary | ICD-10-CM | POA: Diagnosis not present

## 2024-09-01 DIAGNOSIS — I350 Nonrheumatic aortic (valve) stenosis: Secondary | ICD-10-CM | POA: Insufficient documentation

## 2024-09-01 DIAGNOSIS — R0600 Dyspnea, unspecified: Secondary | ICD-10-CM | POA: Insufficient documentation

## 2024-09-01 DIAGNOSIS — N189 Chronic kidney disease, unspecified: Secondary | ICD-10-CM | POA: Diagnosis not present

## 2024-09-01 DIAGNOSIS — Z8616 Personal history of COVID-19: Secondary | ICD-10-CM | POA: Diagnosis not present

## 2024-09-01 LAB — CUP PACEART REMOTE DEVICE CHECK
Battery Remaining Longevity: 66 mo
Battery Voltage: 2.97 V
Brady Statistic RV Percent Paced: 64.67 %
Date Time Interrogation Session: 20260105203549
HighPow Impedance: 78 Ohm
Implantable Lead Connection Status: 753985
Implantable Lead Connection Status: 753985
Implantable Lead Connection Status: 753985
Implantable Lead Implant Date: 20180907
Implantable Lead Implant Date: 20180907
Implantable Lead Implant Date: 20180907
Implantable Lead Location: 753858
Implantable Lead Location: 753859
Implantable Lead Location: 753860
Implantable Lead Model: 4396
Implantable Lead Model: 5076
Implantable Pulse Generator Implant Date: 20241004
Lead Channel Impedance Value: 1083 Ohm
Lead Channel Impedance Value: 247 Ohm
Lead Channel Impedance Value: 323 Ohm
Lead Channel Impedance Value: 342 Ohm
Lead Channel Impedance Value: 494 Ohm
Lead Channel Impedance Value: 608 Ohm
Lead Channel Pacing Threshold Amplitude: 1.25 V
Lead Channel Pacing Threshold Amplitude: 1.75 V
Lead Channel Pacing Threshold Pulse Width: 0.4 ms
Lead Channel Pacing Threshold Pulse Width: 0.8 ms
Lead Channel Sensing Intrinsic Amplitude: 0.4 mV
Lead Channel Sensing Intrinsic Amplitude: 9.8 mV
Lead Channel Setting Pacing Amplitude: 1.75 V
Lead Channel Setting Pacing Amplitude: 2 V
Lead Channel Setting Pacing Amplitude: 2.5 V
Lead Channel Setting Pacing Pulse Width: 0.4 ms
Lead Channel Setting Pacing Pulse Width: 0.8 ms
Lead Channel Setting Sensing Sensitivity: 0.3 mV
Zone Setting Status: 755011
Zone Setting Status: 755011

## 2024-09-01 MED ORDER — METOLAZONE 5 MG PO TABS: 5.0000 mg | ORAL_TABLET | ORAL | 3 refills | Status: DC

## 2024-09-01 MED ORDER — TORSEMIDE 100 MG PO TABS: 100.0000 mg | ORAL_TABLET | Freq: Two times a day (BID) | ORAL | 3 refills | Status: DC

## 2024-09-01 NOTE — Progress Notes (Signed)
 Called and spoke to pt and his daughter. Informed of lab results per Landry Ferrari, NP. They said they just left cardiology and the diuretics were adjusted. NFN.

## 2024-09-01 NOTE — Patient Instructions (Signed)
 CHANGE Torsemide  to 100 mg Twice daily  CHANGE Metolazone  to 5 mg daily for the next 3 days with an extra 40 mEq of Potassium. Then change to 5 mg every Wednesday and Sunday.   Your physician recommends that you schedule a follow-up appointment in: 2 weeks.  If you have any questions or concerns before your next appointment please send us  a message through [place] or call our office at [phone].    TO LEAVE A MESSAGE FOR THE NURSE SELECT OPTION 2, PLEASE LEAVE A MESSAGE INCLUDING: YOUR NAME DATE OF BIRTH CALL BACK NUMBER REASON FOR CALL**this is important as we prioritize the call backs  YOU WILL RECEIVE A CALL BACK THE SAME DAY AS LONG AS YOU CALL BEFORE 4:00 PM  At the [facility], you and your health needs are our priority. As part of our continuing mission to provide you with exceptional heart care, we have created designated Provider Care Teams. These Care Teams include your primary Cardiologist (physician) and Advanced Practice Providers (APPs- Physician Assistants and Nurse Practitioners) who all work together to provide you with the care you need, when you need it.   You may see any of the following providers on your designated Care Team at your next follow up: Dr Toribio Fuel Dr Ezra Shuck Dr. Morene Brownie Greig Mosses, NP Caffie Shed, [place] [facility] [place], [place] Beckey Coe, NP Jordan Lee, NP Ellouise Class, NP Tinnie Redman, PharmD Jaun Bash, PharmD   Please be sure to bring in all your medications bottles to every appointment.    Thank you for choosing [facility]

## 2024-09-04 ENCOUNTER — Ambulatory Visit: Payer: Self-pay | Admitting: Cardiology

## 2024-09-06 ENCOUNTER — Telehealth (HOSPITAL_COMMUNITY): Payer: Self-pay

## 2024-09-06 ENCOUNTER — Non-Acute Institutional Stay: Payer: Self-pay | Admitting: Nurse Practitioner

## 2024-09-06 ENCOUNTER — Telehealth: Payer: Self-pay

## 2024-09-06 ENCOUNTER — Encounter: Payer: Self-pay | Admitting: Nurse Practitioner

## 2024-09-06 DIAGNOSIS — I10 Essential (primary) hypertension: Secondary | ICD-10-CM

## 2024-09-06 DIAGNOSIS — I484 Atypical atrial flutter: Secondary | ICD-10-CM | POA: Diagnosis not present

## 2024-09-06 DIAGNOSIS — N138 Other obstructive and reflux uropathy: Secondary | ICD-10-CM

## 2024-09-06 DIAGNOSIS — I5022 Chronic systolic (congestive) heart failure: Secondary | ICD-10-CM | POA: Diagnosis not present

## 2024-09-06 DIAGNOSIS — E1169 Type 2 diabetes mellitus with other specified complication: Secondary | ICD-10-CM | POA: Diagnosis not present

## 2024-09-06 DIAGNOSIS — R42 Dizziness and giddiness: Secondary | ICD-10-CM | POA: Diagnosis not present

## 2024-09-06 DIAGNOSIS — E039 Hypothyroidism, unspecified: Secondary | ICD-10-CM | POA: Diagnosis not present

## 2024-09-06 DIAGNOSIS — K219 Gastro-esophageal reflux disease without esophagitis: Secondary | ICD-10-CM | POA: Diagnosis not present

## 2024-09-06 DIAGNOSIS — N1831 Chronic kidney disease, stage 3a: Secondary | ICD-10-CM

## 2024-09-06 DIAGNOSIS — M150 Primary generalized (osteo)arthritis: Secondary | ICD-10-CM | POA: Diagnosis not present

## 2024-09-06 DIAGNOSIS — Z794 Long term (current) use of insulin: Secondary | ICD-10-CM | POA: Diagnosis not present

## 2024-09-06 DIAGNOSIS — I25810 Atherosclerosis of coronary artery bypass graft(s) without angina pectoris: Secondary | ICD-10-CM | POA: Diagnosis not present

## 2024-09-06 DIAGNOSIS — I951 Orthostatic hypotension: Secondary | ICD-10-CM

## 2024-09-06 DIAGNOSIS — F339 Major depressive disorder, recurrent, unspecified: Secondary | ICD-10-CM

## 2024-09-06 DIAGNOSIS — M1A0490 Idiopathic chronic gout, unspecified hand, without tophus (tophi): Secondary | ICD-10-CM | POA: Diagnosis not present

## 2024-09-06 NOTE — Assessment & Plan Note (Signed)
 stable, on PPI, Hx of Reglan  use, Hgb 11.8 08/13/24

## 2024-09-06 NOTE — Telephone Encounter (Addendum)
 His OptiVol suggests volume depletion starting 01/08.   Takes metolazone  twice a week. Hold that for now.   Torsemide  was just increased to 100 mg BID. Hold Torsemide  today and tomorrow then restart at 80 mg BID. Continue KCL 40 BID  Can drink a little more fluid the next couple of days.   Get labs done when able.

## 2024-09-06 NOTE — Assessment & Plan Note (Signed)
 Hx of CABG, TAVR, no chest pain, off Isosorbide 

## 2024-09-06 NOTE — Assessment & Plan Note (Signed)
 on Levothyroxine , TSH 2.24 03/16/24

## 2024-09-06 NOTE — Assessment & Plan Note (Signed)
 Hgb A1c 6.2 08/04/24,  on insulin  No urinary retention

## 2024-09-06 NOTE — Telephone Encounter (Signed)
 Received remote transmission.   Spoke with daughter Emmie and results reviewed.  Optivol thoracic impedance suggesting possible dryness starting 09/02/2024.    Prescribed: Torsemide  20 mg take 5 tablets (100 mg total) by mouth twice a day   Potassium 20 mEq take 2 tablet(s) (40 mEq total) by mouth twice a day. Additional 40 mEq with each Metolazone  dose.     Metolazone  5 mg Take 1 tablet by mouth 2 times a week.  Every Wednesday and Sunday. Take 40 mEq of Potassium with each Metolazone  dose  Spironolactone  25 mg take 1 tablet daily  Advised daughter to have facility to hold evening Dose of Torsemide  today until HF clinic provides further recommendations.    Daughter stated the facility unable to draw labs yet due to patient difficult to stick which may be related to dehyrdration.   Report sent to Dr Zenaida at [facility] clinic for review and recommendations if needed.  Pt last seen in office 09/01/2024.

## 2024-09-06 NOTE — Assessment & Plan Note (Signed)
 mood is better with Effexor , failed Zoloft 

## 2024-09-06 NOTE — Assessment & Plan Note (Signed)
 s/p left replacement, chronic left knee pain, lower back pain, followed by orthopedics, ambulates with walker, risk of falling. Prn Oxy available to him.

## 2024-09-06 NOTE — Assessment & Plan Note (Signed)
 Hyponatremia, Na 134 08/30/24             CKD Bun/creat 43/1.65 08/30/24

## 2024-09-06 NOTE — Assessment & Plan Note (Signed)
 Hgb A1c 6.2 08/04/24,  on insulin 

## 2024-09-06 NOTE — Telephone Encounter (Signed)
 Spoke with daughter.  Advised of recommendations given by Manuelita Dutch PA at HF clinic (see other phone note).  Explained HF clinic will call the facility since they will need to fax orders to the facility.  Next ICM Remote transmission scheduled for 09/13/2024.  She appreciated the updated information.

## 2024-09-06 NOTE — Progress Notes (Signed)
 Remote ICD Transmission

## 2024-09-06 NOTE — Progress Notes (Unsigned)
 " Location:   [facility] Room Number: 913 Place of Service:  ALF (13) Provider: Larwance Londa Mackowski NP  Antionne Enrique X, NP  Patient Care Team: Carlin Attridge X, NP as PCP - General (Internal Medicine) [name], MD as PCP - Cardiology (Cardiology) [name] Chew, MD as PCP - Electrophysiology (Cardiology) Nieves Cough, MD as Consulting Physician (Urology) Liane Sharyne MATSU, [facility] (Inactive) as Pharmacist (Pharmacist) Leslee Reusing, MD as Consulting Physician (Ophthalmology)  Extended Emergency Contact Information Primary Emergency Contact: [facility] Address: [address]          [place], [place] [address] [place] Phone: [phone] Relation: Daughter Secondary Emergency Contact: Billie Risen Address: [address]          [place], [place] [address] [place] Home Phone: [phone] Mobile Phone: [phone] Relation: Daughter  Code Status: DNR Goals of care: Advanced Directive information    08/04/2024    3:14 PM  Advanced Directives  Does patient want to make changes to medical advance directive? No - Patient declined     Chief Complaint  Patient presents with   Acute Visit    Cough, vertigo    HPI:  Pt is a 89 y.o. male seen today for an acute visit for reported a vertigo episode that he almost could not make it to the bathroom, complaining of nausea and a coughing, taking torsemide  100 mg twice daily per Cardiology.  The patient is skin appears to dry, flushed facial appearance, no swelling BLE.   hospitalized from 08/03/2024 to 08/05/2024 with hypotension probably the result of too vigorous diuresis.  He has a history of heart disease, status post CABG, chronic biventricular heart failure, atrial fibrillation, and status post TAVR with an ICD in place. After the hypotension was noted in his cardiology office he was sent to the emergency room where he received IV fluid bolus of 3 to 500 mL with  improvement in his blood pressure with systolic in the 80s.  Diuretics were briefly held and some of his cardiac medicines including Imdur  and Entresto  were indefinitely discontinued.  Creatinine was slightly increased on admission but did not meet standards for acute kidney Failure.  Post hydration creatinine improved and returned to baseline    OA, s/p left replacement, chronic left knee pain, lower back pain, followed by orthopedics, ambulates with walker, risk of falling. Prn Oxy available to him.  `            UTI, treated with Fosfomycin by Urology, 01/16/24 urology Fosfomycin 3gm po q48hr x 2 wks.               Hospitalized 11/17/23-11/27/23 for hypoxic respiratory failure COVID/PNA and acute HRmrEF, left pleural effusion-s/p thoracentesis with 1000cc of bloody fluid removed exudative by LDH,  improved SOB, Orthopnea, and edema BLE, treated with Prednisone , bronchodilators, antibiotics, and diuretics. Underwent evaluation of pulmonology, cardiology.              12/15/23 CXR mild CHF, no gross focal lund consolidation, pleural effusion, pneumothorax             Leukocytosis, chronic, wbc 17.7, neutrophils 73% 01/20/24             Gout, left knee flare up recently, treated with Prednisone ,  R+L wrist, takes Allopurinol , colchicine ,  uric acid 7.6 10/10/22             Afib, s/p pace maker, defibrillator , on Amiodarone , Pradaxa , Metoprolol , followed by cardiology.  HLD on ASA, Atorvastatin , LDL 55 10/14/23             CAD, Hx of CABG, TAVR, no chest pain, off Isosorbide              CHF, no edema BLE today, off Farxiga  due to recurrent UTI, on Entresto , Metoprolol , Metolazone , Spironolactone , Torsemide , pro BNP 4372 08/30/24, followed by cardiologist             Hyponatremia, Na 134 08/30/24             CKD Bun/creat 43/1.65 08/30/24             Hypothyroidism, on Levothyroxine , TSH 2.24 03/16/24             HTN, blood pressure runs low,  off Entresto , on Metoprolol , diuretics.              GERD,  stable, on PPI, Hx of Reglan  use, Hgb 11.8 08/13/24             Depression, mood is better with Effexor , failed Zoloft               T2DM, Hgb A1c 6.2 08/04/24,  on insulin              Urinary frequency, taking tamsulosin      Past Medical History:  Diagnosis Date   Age-related macular degeneration, dry, both eyes    Allergic    24/7; 365 days/year; I'm allergic to pollens, dust, all southern grasses/trees, mold, mildue, cats, dogs (01/07/2017)   Anal fissure    Asthma    sees Dr. Alaine    Benign prostatic hypertrophy    (sees Dr. Colan   CAD (coronary artery disease)    a. s/p CABG 2007. b. Cath 08/2016 - 4/4 patent grafts.   Carotid bruit    carotid u/s 10/10: 0.39% bilaterally   Chronic combined systolic and diastolic CHF (congestive heart failure) (HCC)    Complication of anesthesia 1980s   w/anal cyst OR, he gave me a saddle block then put a narcotic in spinal cord; had a severe reaction to that (01/07/2017)   Congestive heart failure (CHF) (HCC)    Coronavirus infection 11/18/2023   ED (erectile dysfunction)    Family history of adverse reaction to anesthesia    daughter wakes up during OR (01/07/2017)   GERD (gastroesophageal reflux disease)    Gout    HTN (hypertension)    Hx of colonic polyps    (sees Dr. Abran)   Hyperlipidemia    Hypothyroidism    Moderate to severe aortic stenosis    a. s/p TAVR 02/2017.   Myocardial infarction [facility]) ~ 2000   Obesity    Osteoarthritis    was in my knees, hands (01/07/2017 )   PAF (paroxysmal atrial fibrillation) (HCC)    a. documented post TAVR.   Precancerous skin lesion    (sees Dr. 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 INVASIVE CV LAB;  Service: Cardiovascular;  Laterality: N/A;   CARDIAC CATHETERIZATION  10/29/2005   CARDIAC CATHETERIZATION N/A 08/27/2016   Procedure: Right/Left Heart Cath and Coronary/Graft Angiography;  Surgeon: Lonni JONETTA Cash, MD;  Location: [facility] INVASIVE CV LAB;  Service: Cardiovascular;  Laterality: N/A;   CARDIOVERSION N/A 04/24/2021   Procedure: CARDIOVERSION;  Surgeon: Okey Vina GAILS, MD;  Location: [facility] ENDOSCOPY;  Service: Cardiovascular;  Laterality: N/A;   CARDIOVERSION N/A 11/08/2021   Procedure: CARDIOVERSION;  Surgeon: Mona Vinie BROCKS, MD;  Location: [facility] ENDOSCOPY;  Service: Cardiovascular;  Laterality: N/A;   CARDIOVERSION N/A 02/06/2022   Procedure: CARDIOVERSION;  Surgeon: Delford Maude BROCKS, MD;  Location: [facility] ENDOSCOPY;  Service: Cardiovascular;  Laterality: N/A;   CATARACT EXTRACTION W/ INTRAOCULAR LENS  IMPLANT, BILATERAL Bilateral    CATARACT EXTRACTION, BILATERAL  2012   COLONOSCOPY  06/30/2008   no repeats needed    COLONOSCOPY     had 3 or 4 in the past    CORONARY ARTERY BYPASS GRAFT  2007   CABG X4   CYST EXCISION PERINEAL  1980s   HAMMER TOE SURGERY Bilateral    ICD GENERATOR CHANGEOUT N/A 05/30/2023   Procedure: ICD GENERATOR CHANGEOUT;  Surgeon: [name], MD;  Location: [facility] INVASIVE CV LAB;  Service: Cardiovascular;  Laterality: N/A;   JOINT REPLACEMENT     KNEE ARTHROPLASTY  07/30/2011   Procedure: COMPUTER ASSISTED TOTAL KNEE ARTHROPLASTY;  Surgeon: Cordella Glendia Hutchinson;  Location: [facility] OR;  Service: Orthopedics;  Laterality: Left;  left total knee arthroplasty   MASTECTOMY SUBCUTANEOUS Bilateral    MULTIPLE TOOTH EXTRACTIONS     ORIF FINGER / THUMB FRACTURE Right ~ 1980   repair of thumb injury   PROSTATE BIOPSY     REPLACEMENT TOTAL  KNEE BILATERAL Bilateral 2012   TEE WITHOUT CARDIOVERSION N/A 03/04/2017   Procedure: TRANSESOPHAGEAL ECHOCARDIOGRAM (TEE);  Surgeon: Cash Lonni JONETTA, MD;  Location: [facility] OR;  Service: Open Heart Surgery;  Laterality: N/A;   TOTAL KNEE REVISION Right 05/18/2019   Procedure: RIGHT PATELLA REVISION/REMOVAL;  Surgeon: Hutchinson Cordella Glendia, MD;  Location: [facility] OR;  Service: Orthopedics;  Laterality: Right;   TRANSCATHETER AORTIC VALVE REPLACEMENT, TRANSFEMORAL N/A 03/04/2017   Procedure: TRANSCATHETER AORTIC VALVE REPLACEMENT, TRANSFEMORAL;  Surgeon: Cash Lonni JONETTA, MD;  Location: [facility] OR;  Service: Open Heart Surgery;  Laterality: N/A;    Allergies[1]  Allergies as of 09/06/2024       Reactions   Peanut (diagnostic) Anaphylaxis   Peanut-containing Drug Products Anaphylaxis   Sulfa Antibiotics Anaphylaxis   Sulfonamide Derivatives Anaphylaxis   Eliquis  [apixaban ] Other (See Comments)   Back/hip pain   Norvasc  [amlodipine ] Swelling   *lower extremity swelling   Xarelto  [rivaroxaban ] Other (See Comments)   Back/hip pain   Zestril  [lisinopril ] Cough   Lactose Other (See Comments)   Unknown reaction        Medication List        Accurate as of September 06, 2024  4:18 PM. If you have any questions, ask your nurse or doctor.          albuterol  108 (90 Base) MCG/ACT inhaler Commonly known as: VENTOLIN  HFA Inhale 2 puffs into the lungs every 4 (four) hours as needed for wheezing or shortness of breath.   allopurinol  100 MG tablet Commonly known as: ZYLOPRIM  Take 1 tablet (100 mg total) by mouth daily.   amiodarone  200 MG tablet Commonly known as: PACERONE  Take 1 tablet (200 mg total) by mouth daily.   aspirin  81 MG chewable tablet Chew  81 mg by mouth daily.   atorvastatin  20 MG tablet Commonly known as: LIPITOR Take 20 mg by mouth daily.   cetirizine 10 MG tablet Commonly known as: ZYRTEC Take 5 mg by mouth daily.   dabigatran  150 MG Caps capsule Commonly  known as: Pradaxa  TAKE (1) CAPSULE TWICE DAILY.   diclofenac  Sodium 1 % Gel Commonly known as: Voltaren  Apply 4 g topically 3 (three) times daily as needed (pain).   docusate sodium  100 MG capsule Commonly known as: COLACE Take 100 mg by mouth in the morning and at bedtime.   fluticasone  50 MCG/ACT nasal spray Commonly known as: FLONASE  Place 2 sprays into both nostrils daily.   levothyroxine  175 MCG tablet Commonly known as: SYNTHROID  Take 175 mcg by mouth daily before breakfast.   metoCLOPramide  10 MG tablet Commonly known as: REGLAN  Take 10 mg by mouth 2 (two) times daily before a meal.   metolazone  5 MG tablet Commonly known as: ZAROXOLYN  Take 1 tablet (5 mg total) by mouth 2 (two) times a week. Every Wednesday and SundayTake 40 mEq of Potassium with the metolazone    metoprolol  succinate 100 MG 24 hr tablet Commonly known as: TOPROL -XL Take 0.5 tablets (50 mg total) by mouth at bedtime.   montelukast  10 MG tablet Commonly known as: SINGULAIR  TAKE ONE TABLET BY MOUTH ONCE DAILY IN THE EVENING   Multivitamin Gummies Mens Chew Chew 2 each by mouth daily.   oxyCODONE  5 MG immediate release tablet Commonly known as: Oxy IR/ROXICODONE  Take 5 mg by mouth every 8 (eight) hours as needed (knee pain).   OXYGEN Inhale 2 L/min into the lungs continuous.   pantoprazole  40 MG tablet Commonly known as: PROTONIX  Take 40 mg by mouth in the morning and at bedtime.   potassium chloride  SA 20 MEQ tablet Commonly known as: KLOR-CON  M Take 2 tablets (40 mEq total) by mouth 2 (two) times daily.   Spiriva  Respimat 2.5 MCG/ACT Aers Generic drug: Tiotropium Bromide  Inhale 2 puffs into the lungs daily.   spironolactone  25 MG tablet Commonly known as: ALDACTONE  Take 1 tablet (25 mg total) by mouth daily.   tamsulosin  0.4 MG Caps capsule Commonly known as: FLOMAX  Take 0.4 mg by mouth at bedtime.   torsemide  100 MG tablet Commonly known as: DEMADEX  Take 1 tablet (100 mg  total) by mouth 2 (two) times daily.   triamcinolone  cream 0.5 % Commonly known as: KENALOG  Apply 1 Application topically as directed.   UNABLE TO FIND Inhale 1 Device into the lungs at bedtime. CPAP; Apply CPAP mask at bedtime.   venlafaxine  XR 37.5 MG 24 hr capsule Commonly known as: EFFEXOR -XR Take 37.5 mg by mouth daily.        Review of Systems  Constitutional:  Positive for fatigue. Negative for appetite change and fever.  HENT:  Negative for congestion and trouble swallowing.   Eyes:  Negative for visual disturbance.  Respiratory:  Positive for cough. Negative for shortness of breath and wheezing.   Cardiovascular:  Negative for chest pain, palpitations and leg swelling.  Gastrointestinal:  Positive for nausea. Negative for abdominal pain and constipation.  Genitourinary:  Positive for frequency. Negative for dysuria and urgency.       Nocturnal urination about every 2-3 hours.   Musculoskeletal:  Positive for arthralgias and gait problem.       S/p L knee replacement, feels weak and clicks, left knee pain is chronic  Skin:  Negative for color change.  Neurological:  Positive for dizziness.  Negative for speech difficulty, weakness and headaches.       Vertigo episode last night.   Psychiatric/Behavioral:  Negative for confusion, dysphoric mood and sleep disturbance. The patient is not nervous/anxious.     Immunization History  Administered Date(s) Administered   Fluad Quad(high Dose 65+) 05/06/2019, 05/30/2020, 06/18/2021, 05/27/2023, 06/09/2024   Hep A / Hep B 04/06/2012, 10/07/2012   Hepatitis B 05/07/2012   INFLUENZA, HIGH DOSE SEASONAL PF 05/15/2018, 05/31/2022   Influenza Split 06/28/2013   Influenza Whole 06/05/2007, 05/13/2008, 06/06/2009, 05/30/2010   Influenza,inj,Quad PF,6+ Mos 05/20/2016   Influenza-Unspecified 05/25/2014, 07/08/2015, 05/06/2017, 07/29/2023   Moderna Covid-19 Vaccine Bivalent Booster 31yrs & up 05/31/2022   PFIZER(Purple  Top)SARS-COV-2 Vaccination 09/28/2019, 10/12/2019, 05/30/2020   PPD Test 03/31/2023   Pfizer Covid-19 Vaccine Bivalent Booster 53yrs & up 11/18/2021, 07/29/2023, 06/23/2024   Pneumococcal Conjugate-13 09/18/2015   Pneumococcal Polysaccharide-23 05/13/2008, 05/25/2014   Td 05/30/2010   Tdap 10/27/2020   Zoster Recombinant(Shingrix) 05/18/2018, 10/16/2018   Zoster, Live 10/21/2007   Pertinent  Health Maintenance Due  Topic Date Due   HEMOGLOBIN A1C  02/02/2025   OPHTHALMOLOGY EXAM  03/16/2025   FOOT EXAM  04/05/2025   Influenza Vaccine  Completed      03/31/2023   10:48 AM 10/09/2023    1:31 PM 11/13/2023    1:50 PM 12/31/2023    8:52 AM 02/10/2024    9:05 AM  Fall Risk  Falls in the past year? 0 0 0 0 0  Was there an injury with Fall? 0  0  0   0   Fall Risk Category Calculator 0 0 0  0  Patient at Risk for Falls Due to No Fall Risks History of fall(s) Impaired balance/gait;Impaired mobility    Fall risk Follow up Falls evaluation completed Falls evaluation completed Falls evaluation completed       Data saved with a previous flowsheet row definition   Functional Status Survey:    Vitals:   09/06/24 1316  BP: 104/71  Pulse: 99  Resp: 20  Temp: (!) 97.3 F (36.3 C)  SpO2: 97%  Weight: 250 lb (113.4 kg)   Body mass index is 34.87 kg/m. Physical Exam Vitals and nursing note reviewed.  Constitutional:      Appearance: He is obese.     Comments: fatigue  HENT:     Head: Normocephalic and atraumatic.     Nose: Nose normal.  Eyes:     Extraocular Movements: Extraocular movements intact.     Conjunctiva/sclera: Conjunctivae normal.     Pupils: Pupils are equal, round, and reactive to light.  Cardiovascular:     Rate and Rhythm: Normal rate. Rhythm irregular.     Heart sounds: No murmur heard.    Comments: Left upper chest pace maker, defibrillator  Pulmonary:     Effort: Pulmonary effort is normal.     Breath sounds: Rales present.     Comments: Right  base rales.  Abdominal:     General: Bowel sounds are normal.     Palpations: Abdomen is soft.     Tenderness: There is no abdominal tenderness. There is no right CVA tenderness, left CVA tenderness, guarding or rebound.     Comments: Last BM yesterday  Musculoskeletal:        General: No tenderness.     Cervical back: Normal range of motion and neck supple.     Right lower leg: No edema.     Left lower leg: No edema.  Comments: trace edema BLE Noted baker's cyst R popliteal area.   Skin:    General: Skin is warm and dry.     Comments: Dry skin dermatitis Flushed facial appearance.   Neurological:     General: No focal deficit present.     Mental Status: He is alert and oriented to person, place, and time. Mental status is at baseline.     Motor: No weakness.     Gait: Gait abnormal.  Psychiatric:        Mood and Affect: Mood normal.        Behavior: Behavior normal.        Thought Content: Thought content normal.     Comments: Stated he feels difficulty adjusting to [facility]      Labs reviewed: Recent Labs    11/25/23 0728 11/26/23 0417 11/27/23 0410 12/16/23 0000 08/05/24 0227 08/13/24 1248 08/30/24 1150  NA 134* 134* 134*   < > 136 138 134*  K 4.0 3.8 3.8   < > 3.4* 3.6 3.7  CL 98 97* 96*   < > 100 97* 92*  CO2 27 27 29    < > 29 29 31   GLUCOSE 87 94 106*   < > 120* 98 90  BUN 41* 43* 46*   < > 43* 32* 43*  CREATININE 1.13 1.11 1.32*   < > 1.96* 2.00* 1.65*  CALCIUM  7.8* 7.8* 7.7*   < > 8.2* 8.8* 8.5  MG 2.7* 2.6* 2.5*  --   --   --   --    < > = values in this interval not displayed.   Recent Labs    01/20/24 1702 03/16/24 0000 08/03/24 1200 08/04/24 0246  AST 66* 26 26 25   ALT 31 15 15 15   ALKPHOS 130* 155* 87 83  BILITOT 0.7  --  1.0 1.0  PROT 6.9  --  6.3* 6.2*  ALBUMIN  2.6* 3.0* 2.3* 2.2*   Recent Labs    12/16/23 0000 01/20/24 1702 08/03/24 1200 08/04/24 0246 08/05/24 0227 08/13/24 1248  WBC 9.7 17.7* 11.8* 12.3* 11.9* 13.2*  NEUTROABS  5,645.00 12.9* 7.9*  --   --   --   HGB 12.8* 14.0 11.6* 11.2* 10.7* 11.8*  HCT 41 44.2 35.7* 34.9* 33.6* 36.2*  MCV  --  87.9 88.8 88.1 87.5 88.7  PLT 211 345 272 273 264 300   Lab Results  Component Value Date   TSH 1.969 08/03/2024   Lab Results  Component Value Date   HGBA1C 6.2 (H) 08/04/2024   Lab Results  Component Value Date   CHOL 108 10/14/2023   HDL 33 (L) 10/14/2023   LDLCALC 55 10/14/2023   TRIG 113 10/14/2023   CHOLHDL 3.3 10/14/2023    Significant Diagnostic Results in last 30 days:  CUP PACEART REMOTE DEVICE CHECK Result Date: 09/01/2024 ICD: Scheduled remote reviewed. Normal device function.  Presenting rhythm: AF/BiVP Permanent AF, Pradaxa , good V-rate control Next remote transmission per protocol. ML, CVRS  DG Chest 2 View Result Date: 08/30/2024 CLINICAL DATA:  Chest congestion. EXAM: CHEST - 2 VIEW COMPARISON:  08/03/2024 and CT chest 01/27/2024. FINDINGS: Trachea is midline. Heart is enlarged, stable. Pacemaker and ICD lead tips are stable in position. Aortic valve replacement. Biapical pleural thickening. Mild mid and lower lung zone scarring, as on CT chest 01/27/2024. No airspace consolidation. Probable small chronic bilateral pleural effusions. IMPRESSION: 1. Probable small chronic bilateral pleural effusions. 2. Otherwise, no acute findings. Electronically Signed  By: Newell Eke M.D.   On: 08/30/2024 13:09    Assessment/Plan: Vertigo   a vertigo episode last night that he almost could not make it to the bathroom, complaining of nausea and a coughing, taking torsemide  100 mg twice daily per Cardiology.  The patient is skin appears to dry, flushed facial appearance, no swelling BLE.  Obtain orthostatic blood pressure 3 sets, hold torsemide /KCl, vital signs and SAT O2 every shift for 72 hours, obtain CBC/differential, CMP/eGFR, CXR AP/lateral  Benzonatate  100 mg 3 times daily for 10 days  Osteoarthritis s/p left replacement, chronic left knee pain,  lower back pain, followed by orthopedics, ambulates with walker, risk of falling. Prn Oxy available to him.   GOUT eft knee flare up recently, treated with Prednisone ,  R+L wrist, takes Allopurinol , colchicine ,  uric acid 7.6 10/10/22  Atypical atrial flutter (HCC)  s/p pace maker, defibrillator , on Amiodarone , Pradaxa , Metoprolol , followed by cardiology.   CAD, AUTOLOGOUS BYPASS GRAFT Hx of CABG, TAVR, no chest pain, off Isosorbide   Chronic systolic CHF (congestive heart failure) (HCC) no edema BLE today, off Farxiga  due to recurrent UTI, on Entresto , Metoprolol , Metolazone , Spironolactone , Torsemide , pro BNP 4372 08/30/24, followed by cardiologist  Chronic kidney disease, stage 3a (HCC) Hyponatremia, Na 134 08/30/24             CKD Bun/creat 43/1.65 08/30/24  Hypothyroidism on Levothyroxine , TSH 2.24 03/16/24  Essential hypertension blood pressure runs low,  off Entresto , on Metoprolol , diuretics.  Hold Torsemide , monitor Bp, orthostatic Bp  Gastroesophageal reflux disease stable, on PPI, Hx of Reglan  use, Hgb 11.8 08/13/24  Recurrent depression mood is better with Effexor , failed Zoloft   Type 2 diabetes mellitus (HCC) Hgb A1c 6.2 08/04/24,  on insulin     Family/ staff Communication: Plan of care reviewed with the patient and charge nurse  Labs/tests ordered: CBC/differential, CMP/eGFR, CXR ap/lateral.        [1] Allergies Allergen Reactions   Peanut (Diagnostic) Anaphylaxis   Peanut-Containing Drug Products Anaphylaxis   Sulfa Antibiotics Anaphylaxis   Sulfonamide Derivatives Anaphylaxis   Eliquis  [Apixaban ] Other (See Comments)    Back/hip pain   Norvasc  [Amlodipine ] Swelling    *lower extremity swelling   Xarelto  [Rivaroxaban ] Other (See Comments)    Back/hip pain   Zestril  [Lisinopril ] Cough   Lactose Other (See Comments)    Unknown reaction  "

## 2024-09-06 NOTE — Telephone Encounter (Signed)
 Patient daughter called stating he is dehydrated and they suggest stopping torsemide  and Potassium. Man X, NP confirmed he was dehydrated also and they have tried to get blood drawn but they are having a difficult time being he is so dehydrated a Phlebotomist is coming in tomorrow to try and get labs done and they need to know what all they need to do moving forward as far as fluid intake and how to get back hydrated without in taking too much fluid being the daughter has stopped fluid pills. BP has been in a normal range this morning his BP was 104/71. Please advise on next steps.

## 2024-09-06 NOTE — Assessment & Plan Note (Addendum)
 no edema BLE today, off Farxiga  due to recurrent UTI, on Entresto , Metoprolol , Metolazone , Spironolactone , Torsemide , pro BNP 4372 08/30/24, followed by cardiologist

## 2024-09-06 NOTE — Assessment & Plan Note (Signed)
 s/p pace maker, defibrillator , on Amiodarone, Pradaxa, Metoprolol, followed by cardiology.

## 2024-09-06 NOTE — Assessment & Plan Note (Signed)
 blood pressure runs low,  off Entresto , on Metoprolol , diuretics.  Hold Torsemide , monitor Bp, orthostatic Bp

## 2024-09-06 NOTE — Assessment & Plan Note (Signed)
 eft knee flare up recently, treated with Prednisone ,  R+L wrist, takes Allopurinol , colchicine ,  uric acid 7.6 10/10/22

## 2024-09-06 NOTE — Telephone Encounter (Signed)
 Spoke with daughter Justin [name], per [facility].  She reports father is not feeling well.  He is dizzy, abdomen is distended and has rib pain from coughing so much.  [facility] thinks he may be dehydrated but could possibly have fluid in abdominal area.  [facility] is doing stat blood work and chest x-ray.   She will have him send a carelink report to review fluid levels.  Advised will call back with the results.     Dr Zenaida recommended at 09/01/2024 office visit to take Metolazone  twice a week and if that is not helping, he may need to start Furoscix .

## 2024-09-06 NOTE — Assessment & Plan Note (Signed)
"    a vertigo episode last night that he almost could not make it to the bathroom, complaining of nausea and a coughing, taking torsemide  100 mg twice daily per Cardiology.  The patient is skin appears to dry, flushed facial appearance, no swelling BLE.  Obtain orthostatic blood pressure 3 sets, hold torsemide /KCl, vital signs and SAT O2 every shift for 72 hours, obtain CBC/differential, CMP/eGFR, CXR AP/lateral  Benzonatate  100 mg 3 times daily for 10 days "

## 2024-09-07 ENCOUNTER — Emergency Department (HOSPITAL_COMMUNITY)

## 2024-09-07 ENCOUNTER — Inpatient Hospital Stay (HOSPITAL_COMMUNITY)
Admission: EM | Admit: 2024-09-07 | Discharge: 2024-09-14 | DRG: 872 | Disposition: A | Discharge status: Skilled Nursing Facility | Source: Skilled Nursing Facility | Attending: Internal Medicine | Admitting: Internal Medicine

## 2024-09-07 DIAGNOSIS — M109 Gout, unspecified: Secondary | ICD-10-CM

## 2024-09-07 DIAGNOSIS — D509 Iron deficiency anemia, unspecified: Secondary | ICD-10-CM | POA: Insufficient documentation

## 2024-09-07 DIAGNOSIS — I10 Essential (primary) hypertension: Secondary | ICD-10-CM | POA: Diagnosis present

## 2024-09-07 DIAGNOSIS — Z951 Presence of aortocoronary bypass graft: Secondary | ICD-10-CM

## 2024-09-07 DIAGNOSIS — Z1612 Extended spectrum beta lactamase (ESBL) resistance: Secondary | ICD-10-CM | POA: Diagnosis present

## 2024-09-07 DIAGNOSIS — Z7901 Long term (current) use of anticoagulants: Secondary | ICD-10-CM

## 2024-09-07 DIAGNOSIS — Z952 Presence of prosthetic heart valve: Secondary | ICD-10-CM

## 2024-09-07 DIAGNOSIS — Z794 Long term (current) use of insulin: Secondary | ICD-10-CM

## 2024-09-07 DIAGNOSIS — Z1152 Encounter for screening for COVID-19: Secondary | ICD-10-CM

## 2024-09-07 DIAGNOSIS — E1169 Type 2 diabetes mellitus with other specified complication: Secondary | ICD-10-CM

## 2024-09-07 DIAGNOSIS — G4733 Obstructive sleep apnea (adult) (pediatric): Secondary | ICD-10-CM | POA: Diagnosis not present

## 2024-09-07 DIAGNOSIS — Z7982 Long term (current) use of aspirin: Secondary | ICD-10-CM

## 2024-09-07 DIAGNOSIS — Z95810 Presence of automatic (implantable) cardiac defibrillator: Secondary | ICD-10-CM

## 2024-09-07 DIAGNOSIS — E039 Hypothyroidism, unspecified: Secondary | ICD-10-CM | POA: Diagnosis not present

## 2024-09-07 DIAGNOSIS — Z8049 Family history of malignant neoplasm of other genital organs: Secondary | ICD-10-CM

## 2024-09-07 DIAGNOSIS — K30 Functional dyspepsia: Secondary | ICD-10-CM | POA: Diagnosis present

## 2024-09-07 DIAGNOSIS — N400 Enlarged prostate without lower urinary tract symptoms: Secondary | ICD-10-CM | POA: Diagnosis present

## 2024-09-07 DIAGNOSIS — Z79899 Other long term (current) drug therapy: Secondary | ICD-10-CM

## 2024-09-07 DIAGNOSIS — K219 Gastro-esophageal reflux disease without esophagitis: Secondary | ICD-10-CM | POA: Diagnosis present

## 2024-09-07 DIAGNOSIS — I480 Paroxysmal atrial fibrillation: Secondary | ICD-10-CM | POA: Diagnosis present

## 2024-09-07 DIAGNOSIS — E872 Acidosis, unspecified: Secondary | ICD-10-CM | POA: Diagnosis present

## 2024-09-07 DIAGNOSIS — B9620 Unspecified Escherichia coli [E. coli] as the cause of diseases classified elsewhere: Secondary | ICD-10-CM | POA: Diagnosis present

## 2024-09-07 DIAGNOSIS — N390 Urinary tract infection, site not specified: Secondary | ICD-10-CM | POA: Diagnosis present

## 2024-09-07 DIAGNOSIS — E871 Hypo-osmolality and hyponatremia: Secondary | ICD-10-CM | POA: Diagnosis present

## 2024-09-07 DIAGNOSIS — Z79890 Hormone replacement therapy (postmenopausal): Secondary | ICD-10-CM

## 2024-09-07 DIAGNOSIS — I350 Nonrheumatic aortic (valve) stenosis: Secondary | ICD-10-CM | POA: Diagnosis present

## 2024-09-07 DIAGNOSIS — Z9842 Cataract extraction status, left eye: Secondary | ICD-10-CM

## 2024-09-07 DIAGNOSIS — E876 Hypokalemia: Secondary | ICD-10-CM | POA: Diagnosis present

## 2024-09-07 DIAGNOSIS — Z87891 Personal history of nicotine dependence: Secondary | ICD-10-CM

## 2024-09-07 DIAGNOSIS — I5022 Chronic systolic (congestive) heart failure: Secondary | ICD-10-CM | POA: Diagnosis present

## 2024-09-07 DIAGNOSIS — E66811 Obesity, class 1: Secondary | ICD-10-CM | POA: Diagnosis present

## 2024-09-07 DIAGNOSIS — N179 Acute kidney failure, unspecified: Secondary | ICD-10-CM | POA: Diagnosis not present

## 2024-09-07 DIAGNOSIS — E119 Type 2 diabetes mellitus without complications: Secondary | ICD-10-CM

## 2024-09-07 DIAGNOSIS — R0789 Other chest pain: Secondary | ICD-10-CM | POA: Diagnosis present

## 2024-09-07 DIAGNOSIS — R0682 Tachypnea, not elsewhere classified: Secondary | ICD-10-CM | POA: Diagnosis present

## 2024-09-07 DIAGNOSIS — Z6834 Body mass index (BMI) 34.0-34.9, adult: Secondary | ICD-10-CM

## 2024-09-07 DIAGNOSIS — I5042 Chronic combined systolic (congestive) and diastolic (congestive) heart failure: Secondary | ICD-10-CM | POA: Diagnosis present

## 2024-09-07 DIAGNOSIS — E860 Dehydration: Secondary | ICD-10-CM | POA: Diagnosis present

## 2024-09-07 DIAGNOSIS — R6520 Severe sepsis without septic shock: Secondary | ICD-10-CM | POA: Diagnosis present

## 2024-09-07 DIAGNOSIS — Z96653 Presence of artificial knee joint, bilateral: Secondary | ICD-10-CM | POA: Diagnosis present

## 2024-09-07 DIAGNOSIS — K5900 Constipation, unspecified: Secondary | ICD-10-CM | POA: Diagnosis present

## 2024-09-07 DIAGNOSIS — E611 Iron deficiency: Secondary | ICD-10-CM | POA: Diagnosis present

## 2024-09-07 DIAGNOSIS — Z803 Family history of malignant neoplasm of breast: Secondary | ICD-10-CM

## 2024-09-07 DIAGNOSIS — I130 Hypertensive heart and chronic kidney disease with heart failure and stage 1 through stage 4 chronic kidney disease, or unspecified chronic kidney disease: Secondary | ICD-10-CM | POA: Diagnosis present

## 2024-09-07 DIAGNOSIS — Z9841 Cataract extraction status, right eye: Secondary | ICD-10-CM

## 2024-09-07 DIAGNOSIS — A419 Sepsis, unspecified organism: Principal | ICD-10-CM | POA: Diagnosis present

## 2024-09-07 DIAGNOSIS — N1831 Chronic kidney disease, stage 3a: Secondary | ICD-10-CM | POA: Diagnosis present

## 2024-09-07 DIAGNOSIS — I951 Orthostatic hypotension: Secondary | ICD-10-CM | POA: Diagnosis present

## 2024-09-07 DIAGNOSIS — Z8249 Family history of ischemic heart disease and other diseases of the circulatory system: Secondary | ICD-10-CM

## 2024-09-07 DIAGNOSIS — Z961 Presence of intraocular lens: Secondary | ICD-10-CM | POA: Diagnosis present

## 2024-09-07 DIAGNOSIS — J45909 Unspecified asthma, uncomplicated: Secondary | ICD-10-CM | POA: Diagnosis present

## 2024-09-07 DIAGNOSIS — Z825 Family history of asthma and other chronic lower respiratory diseases: Secondary | ICD-10-CM

## 2024-09-07 DIAGNOSIS — Z66 Do not resuscitate: Secondary | ICD-10-CM | POA: Diagnosis present

## 2024-09-07 DIAGNOSIS — E1122 Type 2 diabetes mellitus with diabetic chronic kidney disease: Secondary | ICD-10-CM | POA: Diagnosis present

## 2024-09-07 DIAGNOSIS — I2510 Atherosclerotic heart disease of native coronary artery without angina pectoris: Secondary | ICD-10-CM | POA: Diagnosis present

## 2024-09-07 LAB — CBC WITH DIFFERENTIAL/PLATELET

## 2024-09-07 LAB — RESP PANEL BY RT-PCR (RSV, FLU A&B, COVID)  RVPGX2
Influenza A by PCR: NEGATIVE
Influenza B by PCR: NEGATIVE
Resp Syncytial Virus by PCR: NEGATIVE
SARS Coronavirus 2 by RT PCR: NEGATIVE

## 2024-09-07 LAB — I-STAT CHEM 8, ED
BUN: 87 mg/dL — ABNORMAL HIGH (ref 8–23)
Calcium, Ion: 0.92 mmol/L — ABNORMAL LOW (ref 1.15–1.40)
Chloride: 79 mmol/L — ABNORMAL LOW (ref 98–111)
Creatinine, Ser: 2.7 mg/dL — ABNORMAL HIGH (ref 0.61–1.24)
Glucose, Bld: 174 mg/dL — ABNORMAL HIGH (ref 70–99)
HCT: 47 % (ref 39.0–52.0)
Hemoglobin: 16 g/dL (ref 13.0–17.0)
Potassium: 3 mmol/L — ABNORMAL LOW (ref 3.5–5.1)
Sodium: 125 mmol/L — ABNORMAL LOW (ref 135–145)
TCO2: 32 mmol/L (ref 22–32)

## 2024-09-07 LAB — BASIC METABOLIC PANEL WITH GFR
Anion gap: 13 (ref 5–15)
BUN: 85 mg/dL — ABNORMAL HIGH (ref 8–23)
CO2: 35 mmol/L — ABNORMAL HIGH (ref 22–32)
Calcium: 8.7 mg/dL — ABNORMAL LOW (ref 8.9–10.3)
Chloride: 80 mmol/L — ABNORMAL LOW (ref 98–111)
Creatinine, Ser: 2.54 mg/dL — ABNORMAL HIGH (ref 0.61–1.24)
GFR, Estimated: 24 mL/min — ABNORMAL LOW
Glucose, Bld: 190 mg/dL — ABNORMAL HIGH (ref 70–99)
Potassium: 2.7 mmol/L — CL (ref 3.5–5.1)
Sodium: 127 mmol/L — ABNORMAL LOW (ref 135–145)

## 2024-09-07 LAB — PRO BRAIN NATRIURETIC PEPTIDE: Pro Brain Natriuretic Peptide: 2178 pg/mL — ABNORMAL HIGH

## 2024-09-07 LAB — I-STAT CG4 LACTIC ACID, ED: Lactic Acid, Venous: 3.2 mmol/L (ref 0.5–1.9)

## 2024-09-07 LAB — TROPONIN T, HIGH SENSITIVITY: Troponin T High Sensitivity: 52 ng/L — ABNORMAL HIGH (ref 0–19)

## 2024-09-07 MED ORDER — POTASSIUM CHLORIDE 10 MEQ/100ML IV SOLN
10.0000 meq | Freq: Once | INTRAVENOUS | Status: AC
  Administered 2024-09-08: 10 meq via INTRAVENOUS
  Filled 2024-09-07: qty 100

## 2024-09-07 MED ORDER — SODIUM CHLORIDE 0.9 % IV SOLN
2.0000 g | Freq: Once | INTRAVENOUS | Status: AC
  Administered 2024-09-08: 2 g via INTRAVENOUS
  Filled 2024-09-07: qty 20

## 2024-09-07 MED ORDER — POTASSIUM CHLORIDE CRYS ER 20 MEQ PO TBCR
40.0000 meq | EXTENDED_RELEASE_TABLET | Freq: Once | ORAL | Status: AC
  Administered 2024-09-07: 40 meq via ORAL
  Filled 2024-09-07: qty 2

## 2024-09-07 MED ORDER — AZITHROMYCIN 250 MG PO TABS
500.0000 mg | ORAL_TABLET | Freq: Once | ORAL | Status: AC
  Administered 2024-09-08: 500 mg via ORAL
  Filled 2024-09-07: qty 2

## 2024-09-07 MED ORDER — ACETAMINOPHEN 325 MG PO TABS
650.0000 mg | ORAL_TABLET | Freq: Once | ORAL | Status: AC
  Administered 2024-09-08: 650 mg via ORAL
  Filled 2024-09-07: qty 2

## 2024-09-07 MED ORDER — SODIUM CHLORIDE 0.9 % IV BOLUS
1000.0000 mL | Freq: Once | INTRAVENOUS | Status: AC
  Administered 2024-09-08: 1000 mL via INTRAVENOUS

## 2024-09-07 MED ORDER — POTASSIUM CHLORIDE 10 MEQ/100ML IV SOLN
10.0000 meq | Freq: Once | INTRAVENOUS | Status: AC
  Administered 2024-09-07: 10 meq via INTRAVENOUS
  Filled 2024-09-07: qty 100

## 2024-09-07 NOTE — ED Provider Triage Note (Signed)
 Emergency Medicine Provider Triage Evaluation Note  Justin [name] , a 89 y.o. male  was evaluated in triage.  Pt complains of shortness of breath and intermittent dizziness.  Has been going on for about a week.  He states the dizziness only occurs when he stands up.  Also intermittently short of breath but no chest pain.  Recent labs showed that his white blood cell count was very low.  He denies cough fever urinary symptoms nausea and vomiting.  Review of Systems  Positive: See above Negative: See above  Physical Exam  BP 117/78   Pulse 80   Temp 97.6 F (36.4 C) (Oral)   Resp 14   SpO2 98%  Gen:   Awake, no distress   Resp:  Normal effort  MSK:   Moves extremities without difficulty  Other:    Medical Decision Making  Medically screening exam initiated at 4:56 PM.  Appropriate orders placed.  Justin [name] was informed that the remainder of the evaluation will be completed by another provider, this initial triage assessment does not replace that evaluation, and the importance of remaining in the ED until their evaluation is complete.  Work up started   Justin Norleen POUR, PA-C 09/07/24 1657

## 2024-09-07 NOTE — ED Notes (Signed)
 Family concerned about the wait in the waiting room since the pt is elderly.  I advised her that it was because we are so busy and she stated that he came in on the ambulance and I advised her that usually we would have a bed for an elderly pt but we are not able to provide that at this time due to how busy we are.  She talked about taking him back to [facility] if they had to wait too long.

## 2024-09-07 NOTE — Assessment & Plan Note (Signed)
 Better since Torsemide  decreased 09/07/24 80mg  bid/100mg  bid per cardiology.  Likelihood vigorous diureses is contributory to ortho stasis and episodes of vertigo when stood up  09/07/24 Bp lying 112/68, standing 87/56 with c/o feeling dizzy

## 2024-09-07 NOTE — ED Provider Notes (Signed)
 " [facility] EMERGENC[facility] DEPARTMENT AT [facility] Provider Note   CSN: [id number] Arrival date & time: 09/07/24  1551     Patient presents with: Fatigue and Abnormal Lab   Justin [name] is a 89 y.o. male.  {Add pertinent medical, surgical, social history, OB history to YEP:67052} Patient has a history of coronary artery disease bypass surgery congestive heart failure.  He complains of weakness and a cough.  No fever no chills  The history is provided by the patient and medical records. No language interpreter was used.  Weakness Severity:  Moderate Onset quality:  Sudden Timing:  Constant Progression:  Worsening Chronicity:  Recurrent Context: not alcohol use   Relieved by:  Nothing Worsened by:  Nothing Ineffective treatments:  None tried Associated symptoms: cough   Associated symptoms: no abdominal pain, no chest pain, no diarrhea, no frequency, no headaches and no seizures        Prior to Admission medications  Medication Sig Start Date End Date Taking? Authorizing Provider  albuterol  (VENTOLIN  HFA) 108 (90 Base) MCG/ACT inhaler Inhale 2 puffs into the lungs every 4 (four) hours as needed for wheezing or shortness of breath. 05/06/23   [name], MD  allopurinol  (ZYLOPRIM ) 100 MG tablet Take 1 tablet (100 mg total) by mouth daily. 09/18/23   [name], MD  amiodarone  (PACERONE ) 200 MG tablet Take 1 tablet (200 mg total) by mouth daily. 01/14/24   [name], MD  aspirin  81 MG chewable tablet Chew 81 mg by mouth daily.    [provider]  atorvastatin  (LIPITOR) 20 MG tablet Take 20 mg by mouth daily.    [provider]  cetirizine (ZYRTEC) 10 MG tablet Take 5 mg by mouth daily.    [provider]  dabigatran  (PRADAXA ) 150 MG CAPS capsule TAKE (1) CAPSULE TWICE DAILY. 10/24/23   [name], MD  diclofenac  Sodium (VOLTAREN ) 1 % GEL Apply 4 g topically 3 (three) times daily as needed (pain). 01/26/24   [name], MD  docusate sodium  (COLACE) 100 MG capsule Take 100 mg by mouth in the morning and at bedtime.    [provider]  fluticasone  (FLONASE ) 50 MCG/ACT nasal spray Place 2 sprays into both nostrils daily. 12/26/23   [provider]  levothyroxine  (SYNTHROID ) 175 MCG tablet Take 175 mcg by mouth daily before breakfast.    [provider]  metoCLOPramide  (REGLAN ) 10 MG tablet Take 10 mg by mouth 2 (two) times daily before a meal.    [provider]  metolazone  (ZAROXOLYN ) 5 MG tablet Take 1 tablet (5 mg total) by mouth 2 (two) times a week. Every Wednesday and SundayTake 40 mEq of Potassium with the metolazone  09/02/24   Zenaida Morene PARAS, MD  metoprolol  succinate (TOPROL -XL) 100 MG 24 hr tablet Take 0.5 tablets (50 mg total) by mouth at bedtime. 08/05/24   Hongalgi, Anand D, MD  montelukast  (SINGULAIR ) 10 MG tablet TAKE ONE TABLET BY MOUTH ONCE DAILY IN THE EVENING 08/20/22   Hunsucker, Donnice SAUNDERS, MD  Multiple Vitamins-Minerals (MULTIVITAMIN GUMMIES MENS) CHEW Chew 2 each by mouth daily.    [provider]  oxyCODONE  (OXY IR/ROXICODONE ) 5 MG immediate release tablet Take 5 mg by mouth every 8 (eight) hours as needed (knee pain).    [provider]  OXYGEN Inhale 2 L/min into the lungs continuous.    [provider]  pantoprazole  (PROTONIX ) 40 MG tablet Take 40 mg by mouth  in the morning and at bedtime.    [provider]  potassium chloride  SA (KLOR-CON  M) 20 MEQ tablet Take 2 tablets (40 mEq total) by mouth 2 (two) times daily. 08/05/24   Hongalgi, Anand D, MD  spironolactone  (ALDACTONE ) 25 MG tablet Take 1 tablet (25 mg total) by mouth daily. 10/24/23   [name], MD  tamsulosin  (FLOMAX ) 0.4 MG CAPS capsule Take 0.4 mg by mouth at bedtime.    [provider]  Tiotropium Bromide  Monohydrate (SPIRIVA  RESPIMAT) 2.5 MCG/ACT AERS Inhale 2 puffs into the lungs daily. 02/17/24   Hope Almarie ORN, NP  torsemide   (DEMADEX ) 100 MG tablet Take 1 tablet (100 mg total) by mouth 2 (two) times daily. 09/01/24   Zenaida Morene PARAS, MD  triamcinolone  cream (KENALOG ) 0.5 % Apply 1 Application topically as directed. 08/06/24   [provider]  UNABLE TO FIND Inhale 1 Device into the lungs at bedtime. CPAP; Apply CPAP mask at bedtime.    [provider]  venlafaxine  XR (EFFEXOR -XR) 37.5 MG 24 hr capsule Take 37.5 mg by mouth daily.    [provider]    Allergies: Peanut (diagnostic), Peanut-containing drug products, Sulfa antibiotics, Sulfonamide derivatives, Eliquis  [apixaban ], Norvasc  [amlodipine ], Xarelto  [rivaroxaban ], Zestril  [lisinopril ], and Lactose    Review of Systems  Constitutional:  Negative for appetite change and fatigue.  HENT:  Negative for congestion, ear discharge and sinus pressure.   Eyes:  Negative for discharge.  Respiratory:  Positive for cough.   Cardiovascular:  Negative for chest pain.  Gastrointestinal:  Negative for abdominal pain and diarrhea.  Genitourinary:  Negative for frequency and hematuria.  Musculoskeletal:  Negative for back pain.  Skin:  Negative for rash.  Neurological:  Positive for weakness. Negative for seizures and headaches.  Psychiatric/Behavioral:  Negative for hallucinations.     Updated Vital Signs BP (!) 118/91   Pulse 98   Temp 97.6 F (36.4 C) (Oral)   Resp 20   SpO2 99%   Physical Exam Vitals and nursing note reviewed.  Constitutional:      Appearance: He is well-developed.  HENT:     Head: Normocephalic.     Nose: Nose normal.  Eyes:     General: No scleral icterus.    Conjunctiva/sclera: Conjunctivae normal.  Neck:     Thyroid : No thyromegaly.  Cardiovascular:     Rate and Rhythm: Normal rate and regular rhythm.     Heart sounds: No murmur heard.    No friction rub. No gallop.  Pulmonary:     Breath sounds: No stridor. No wheezing or rales.  Chest:     Chest wall: No tenderness.  Abdominal:     General:  There is no distension.     Tenderness: There is no abdominal tenderness. There is no rebound.  Musculoskeletal:        General: Normal range of motion.     Cervical back: Neck supple.  Lymphadenopathy:     Cervical: No cervical adenopathy.  Skin:    Findings: No erythema or rash.  Neurological:     Mental Status: He is alert and oriented to person, place, and time.     Motor: No abnormal muscle tone.     Coordination: Coordination normal.  Psychiatric:        Behavior: Behavior normal.     (all labs ordered are listed, but only abnormal results are displayed) Labs Reviewed  BASIC METABOLIC PANEL WITH GFR - Abnormal; Notable for the following components:  Result Value   Sodium 127 (*)    Potassium 2.7 (*)    Chloride 80 (*)    CO2 35 (*)    Glucose, Bld 190 (*)    BUN 85 (*)    Creatinine, Ser 2.54 (*)    Calcium  8.7 (*)    GFR, Estimated 24 (*)    All other components within normal limits  PRO BRAIN NATRIURETIC PEPTIDE - Abnormal; Notable for the following components:   Pro Brain Natriuretic Peptide 2,178.0 (*)    All other components within normal limits  CBC WITH DIFFERENTIAL/PLATELET - Abnormal; Notable for the following components:   WBC 26.3 (*)    RDW 16.1 (*)    Platelets 427 (*)    All other components within normal limits  TROPONIN T, HIGH SENSITIVITY - Abnormal; Notable for the following components:   Troponin T High Sensitivity 52 (*)    All other components within normal limits  RESP PANEL BY RT-PCR (RSV, FLU A&B, COVID)  RVPGX2  CULTURE, BLOOD (ROUTINE X 2)  CULTURE, BLOOD (ROUTINE X 2)  URINALYSIS, ROUTINE W REFLEX MICROSCOPIC  I-STAT CHEM 8, ED  I-STAT CG4 LACTIC ACID, ED  TROPONIN T, HIGH SENSITIVITY    EKG: EKG Interpretation Date/Time:  Tuesday September 07 2024 16:27:25 EST Ventricular Rate:  100 PR Interval:  156 QRS Duration:  162 QT Interval:  436 QTC Calculation: 557 R Axis:   249  Text Interpretation: Sinus tachycardia Paired  ventricular premature complexes Right bundle branch block Confirmed by Suzette Pac [phone]) on 09/07/2024 11:24:19 PM  Radiology: [facility] Chest 2 View Result Date: 09/07/2024 EXAM: 2 VIEW(S) XRAY OF THE CHEST 09/07/2024 05:12:00 PM COMPARISON: 08/30/2024 CLINICAL HISTORY: FINDINGS: LUNGS AND PLEURA: No focal pulmonary opacity. No pleural effusion. No pneumothorax. HEART AND MEDIASTINUM: Heart size at upper limits. Stable left AICD. Aortic valve noted. Aortic atherosclerosis (ICD10-170.0). BONES AND SOFT TISSUES: Sternotomy wires noted. Vertebral endplate spurring at multiple levels in lower thoracic spine. IMPRESSION: 1. No acute findings. 2. Postsurgical changes of prior sternotomy with aortic valve replacement and stable left AICD. 3. Aortic atherosclerosis. 4. Heart size at the upper limits of normal. 5. Chronic vertebral endplate spurring in the lower thoracic spine. Electronically signed by: Dorethia Molt MD 09/07/2024 05:21 PM EST RP Workstation: [id number]    {Document cardiac monitor, telemetry assessment procedure when appropriate:32947} Procedures   Medications Ordered in the ED  potassium chloride  10 mEq in 100 mL IVPB (has no administration in time range)  potassium chloride  10 mEq in 100 mL IVPB (10 mEq Intravenous New Bag/Given 09/07/24 2327)  cefTRIAXone  (ROCEPHIN ) 2 g in sodium chloride  0.9 % 100 mL IVPB (has no administration in time range)  azithromycin  (ZITHROMAX ) tablet 500 mg (has no administration in time range)  potassium chloride  SA (KLOR-CON  M) CR tablet 40 mEq (40 mEq Oral Given 09/07/24 2322)  Patient's white count is 26,000.  His potassium 2.7 and creatinine is 2.5.    {Click here for ABCD2, HEART and other calculators REFRESH Note before signing:1}                              Medical Decision Making Amount and/or Complexity of Data Reviewed Labs: ordered. ECG/medicine tests: ordered.  Risk Prescription drug management.   Patient has an AKI and hypokalemia.   Patient is most likely septic and will be cultured and given antibiotics  {Document critical care time when appropriate  Document review of labs  and clinical decision tools ie CHADS2VASC2, etc  Document your independent review of radiology images and any outside records  Document your discussion with family members, caretakers and with consultants  Document social determinants of health affecting pt's care  Document your decision making why or why not admission, treatments were needed:32947:::1}   Final diagnoses:  None    ED Discharge Orders     None        "

## 2024-09-07 NOTE — ED Notes (Signed)
 Pt. I-stat Chem 8 results potassium 3.0 and I-stat CG4 Lactic Acid results 3.24. EDP, Zammit made aware.

## 2024-09-07 NOTE — ED Triage Notes (Signed)
 Patient BIBA coming from assisted living, elevated WBC/fatigue, denies pain, BP 118/68 HR70 98% O2 364 CBG hx diabetes.

## 2024-09-07 NOTE — ED Notes (Signed)
 Called Lab about pts blood work not running and was told that they are running it now that there was a problem that insurance kicked it off?  Anyway, this is just a note.

## 2024-09-07 NOTE — H&P (Incomplete)
 " History and Physical    Patient: Justin [name] FMW:[id number] DOB: 1934-07-12 DOA: 09/07/2024 DOS: the patient was seen and examined on 09/07/2024 PCP: Mast, Man X, NP  Patient coming from: {Point_of_Origin:[address]}  Chief Complaint:  Chief Complaint  Patient presents with   Fatigue   Abnormal Lab   HPI: Justin [name] is a 89 y.o. male with medical history significant of ***         Review of Systems: As mentioned in the history of present illness. All other systems reviewed and are negative. Past Medical History:  Diagnosis Date   Age-related macular degeneration, dry, both eyes    Allergic    24/7; 365 days/year; I'm allergic to pollens, dust, all southern grasses/trees, mold, mildue, cats, dogs (01/07/2017)   Anal fissure    Asthma    sees Dr. Alaine    Benign prostatic hypertrophy    (sees Dr. Colan   CAD (coronary artery disease)    a. s/p CABG 2007. b. Cath 08/2016 - 4/4 patent grafts.   Carotid bruit    carotid u/s 10/10: 0.39% bilaterally   Chronic combined systolic and diastolic CHF (congestive heart failure) (HCC)    Complication of anesthesia 1980s   w/anal cyst OR, he gave me a saddle block then put a narcotic in spinal cord; had a severe reaction to that (01/07/2017)   Congestive heart failure (CHF) (HCC)    Coronavirus infection 11/18/2023   ED (erectile dysfunction)    Family history of adverse reaction to anesthesia    daughter wakes up during OR (01/07/2017)   GERD (gastroesophageal reflux disease)    Gout    HTN (hypertension)    Hx of colonic polyps    (sees Dr. Abran)   Hyperlipidemia    Hypothyroidism    Moderate to severe aortic stenosis    a. s/p TAVR 02/2017.   Myocardial infarction [facility]) ~ 2000   Obesity    Osteoarthritis    was in my knees, hands (01/07/2017 )   PAF (paroxysmal atrial fibrillation) (HCC)    a. documented post TAVR.   Precancerous skin lesion    (sees Dr. 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   29 mm Edwards Sapien 3 transcatheter heart valve placed via percutaneous right transfemoral approach   Past Surgical History:  Procedure Laterality Date   ANUS SURGERY     opened it back up cause it wouldn't heal; wound up w/a fissure (01/07/2017)   BIV ICD INSERTION CRT-D N/A 05/02/2017   Procedure: BIV ICD INSERTION CRT-D;  Surgeon: [name], MD;  Location: [facility] INVASIVE CV LAB;  Service: Cardiovascular;  Laterality: N/A;   CARDIAC CATHETERIZATION  10/29/2005   CARDIAC CATHETERIZATION N/A 08/27/2016   Procedure: Right/Left Heart Cath and Coronary/Graft Angiography;  Surgeon: Lonni JONETTA Cash, MD;  Location: [facility] INVASIVE CV LAB;  Service: Cardiovascular;  Laterality: N/A;   CARDIOVERSION N/A 04/24/2021   Procedure: CARDIOVERSION;  Surgeon: Okey Vina GAILS, MD;  Location: [facility] ENDOSCOPY;  Service: Cardiovascular;  Laterality: N/A;   CARDIOVERSION N/A 11/08/2021   Procedure: CARDIOVERSION;  Surgeon: Mona Vinie BROCKS, MD;  Location: [facility] ENDOSCOPY;  Service: Cardiovascular;  Laterality: N/A;   CARDIOVERSION N/A 02/06/2022   Procedure: CARDIOVERSION;  Surgeon: Delford Maude BROCKS, MD;  Location: [facility] ENDOSCOPY;  Service: Cardiovascular;  Laterality: N/A;   CATARACT EXTRACTION W/ INTRAOCULAR LENS  IMPLANT, BILATERAL Bilateral  CATARACT EXTRACTION, BILATERAL  2012   COLONOSCOPY  06/30/2008   no repeats needed    COLONOSCOPY     had 3 or 4 in the past    CORONARY ARTERY BYPASS GRAFT  2007   CABG X4   CYST EXCISION PERINEAL  1980s   HAMMER TOE SURGERY Bilateral    ICD GENERATOR CHANGEOUT N/A 05/30/2023   Procedure: ICD GENERATOR CHANGEOUT;  Surgeon: [name], MD;  Location: [facility] INVASIVE CV LAB;  Service: Cardiovascular;  Laterality: N/A;   JOINT REPLACEMENT     KNEE ARTHROPLASTY  07/30/2011   Procedure: COMPUTER ASSISTED TOTAL KNEE ARTHROPLASTY;  Surgeon: Cordella Glendia Hutchinson;  Location: [facility] OR;  Service:  Orthopedics;  Laterality: Left;  left total knee arthroplasty   MASTECTOMY SUBCUTANEOUS Bilateral    MULTIPLE TOOTH EXTRACTIONS     ORIF FINGER / THUMB FRACTURE Right ~ 1980   repair of thumb injury   PROSTATE BIOPSY     REPLACEMENT TOTAL KNEE BILATERAL Bilateral 2012   TEE WITHOUT CARDIOVERSION N/A 03/04/2017   Procedure: TRANSESOPHAGEAL ECHOCARDIOGRAM (TEE);  Surgeon: [name], MD;  Location[facility] OR;  Service: Open Heart Surgery;  Laterality: N/A;   TOTAL KNEE REVISION Right 05/18/2019   Procedure: RIGHT PATELLA REVISION/REMOVAL;  Surgeon: Hutchinson Cordella Glendia, MD;  Location: [facility] OR;  Service: Orthopedics;  Laterality: Right;   TRANSCATHETER AORTIC VALVE REPLACEMENT, TRANSFEMORAL N/A 03/04/2017   Procedure: TRANSCATHETER AORTIC VALVE REPLACEMENT, TRANSFEMORAL;  Surgeon: [name], MD;  Location: [facility] OR;  Service: Open Heart Surgery;  Laterality: N/A;   Social History:  reports that he quit smoking about 63 years ago. His smoking use included cigarettes. He started smoking about 76 years ago. He has a 45.5 pack-year smoking history. He has never used smokeless tobacco. He reports that he does not currently use alcohol after a past usage of about 7.0 standard drinks of alcohol per week. He reports that he does not use drugs.  Allergies[1]  Family History  Problem Relation Age of Onset   Heart attack Father 66   Allergic rhinitis Father    Asthma Father    Heart failure Mother 90   Uterine cancer Mother    Breast cancer Mother    Colon cancer Neg Hx    Esophageal cancer Neg Hx     Prior to Admission medications  Medication Sig Start Date End Date Taking? Authorizing Provider  albuterol  (VENTOLIN  HFA) 108 (90 Base) MCG/ACT inhaler Inhale 2 puffs into the lungs every 4 (four) hours as needed for wheezing or shortness of breath. 05/06/23   [name], MD  allopurinol  (ZYLOPRIM ) 100 MG tablet Take 1 tablet (100 mg total) by mouth daily. 09/18/23   [name],  MD  amiodarone  (PACERONE ) 200 MG tablet Take 1 tablet (200 mg total) by mouth daily. 01/14/24   [name], MD  aspirin  81 MG chewable tablet Chew 81 mg by mouth daily.    [provider]  atorvastatin  (LIPITOR) 20 MG tablet Take 20 mg by mouth daily.    [provider]  cetirizine (ZYRTEC) 10 MG tablet Take 5 mg by mouth daily.    [provider]  dabigatran  (PRADAXA ) 150 MG CAPS capsule TAKE (1) CAPSULE TWICE DAILY. 10/24/23   [name], MD  diclofenac  Sodium (VOLTAREN ) 1 % GEL Apply 4 g topically 3 (three) times daily as needed (pain). 01/26/24   [name], MD  docusate sodium  (COLACE) 100 MG capsule Take 100 mg  by mouth in the morning and at bedtime.    [provider]  fluticasone  (FLONASE ) 50 MCG/ACT nasal spray Place 2 sprays into both nostrils daily. 12/26/23   [provider]  levothyroxine  (SYNTHROID ) 175 MCG tablet Take 175 mcg by mouth daily before breakfast.    [provider]  metoCLOPramide  (REGLAN ) 10 MG tablet Take 10 mg by mouth 2 (two) times daily before a meal.    [provider]  metolazone  (ZAROXOLYN ) 5 MG tablet Take 1 tablet (5 mg total) by mouth 2 (two) times a week. Every Wednesday and SundayTake 40 mEq of Potassium with the metolazone  09/02/24   Zenaida Morene PARAS, MD  metoprolol  succinate (TOPROL -XL) 100 MG 24 hr tablet Take 0.5 tablets (50 mg total) by mouth at bedtime. 08/05/24   Hongalgi, Anand D, MD  montelukast  (SINGULAIR ) 10 MG tablet TAKE ONE TABLET BY MOUTH ONCE DAILY IN THE EVENING 08/20/22   Hunsucker, Donnice SAUNDERS, MD  Multiple Vitamins-Minerals (MULTIVITAMIN GUMMIES MENS) CHEW Chew 2 each by mouth daily.    [provider]  oxyCODONE  (OXY IR/ROXICODONE ) 5 MG immediate release tablet Take 5 mg by mouth every 8 (eight) hours as needed (knee pain).    [provider]  OXYGEN Inhale 2 L/min into the lungs continuous.    [provider]  pantoprazole  (PROTONIX )  40 MG tablet Take 40 mg by mouth in the morning and at bedtime.    [provider]  potassium chloride  SA (KLOR-CON  M) 20 MEQ tablet Take 2 tablets (40 mEq total) by mouth 2 (two) times daily. 08/05/24   Hongalgi, Anand D, MD  spironolactone  (ALDACTONE ) 25 MG tablet Take 1 tablet (25 mg total) by mouth daily. 10/24/23   [name], MD  tamsulosin  (FLOMAX ) 0.4 MG CAPS capsule Take 0.4 mg by mouth at bedtime.    [provider]  Tiotropium Bromide  Monohydrate (SPIRIVA  RESPIMAT) 2.5 MCG/ACT AERS Inhale 2 puffs into the lungs daily. 02/17/24   Hope Almarie ORN, NP  torsemide  (DEMADEX ) 100 MG tablet Take 1 tablet (100 mg total) by mouth 2 (two) times daily. 09/01/24   Zenaida Morene PARAS, MD  triamcinolone  cream (KENALOG ) 0.5 % Apply 1 Application topically as directed. 08/06/24   [provider]  UNABLE TO FIND Inhale 1 Device into the lungs at bedtime. CPAP; Apply CPAP mask at bedtime.    [provider]  venlafaxine  XR (EFFEXOR -XR) 37.5 MG 24 hr capsule Take 37.5 mg by mouth daily.    [provider]    Physical Exam: Vitals:   09/07/24 1607 09/07/24 2330  BP: 117/78 (!) 118/91  Pulse: 80 98  Resp: 14 20  Temp: 97.6 F (36.4 C)   TempSrc: Oral   SpO2: 98% 99%   *** Data Reviewed: {Tip this will not be part of the note when signed- Document your independent interpretation of telemetry tracing, EKG, lab, Radiology test or any other diagnostic tests. Add any new diagnostic test ordered today. (Optional):26781} Results for orders placed or performed during the hospital encounter of 09/07/24 (from the past 24 hours)  Basic metabolic panel     Status: Abnormal   Collection Time: 09/07/24  4:55 PM  Result Value Ref Range   Sodium 127 (L) 135 - 145 mmol/L   Potassium 2.7 (LL) 3.5 - 5.1 mmol/L   Chloride 80 (L) 98 - 111 mmol/L   CO2 35 (H) 22 - 32 mmol/L   Glucose, Bld 190 (H) 70 - 99 mg/dL   BUN  85 (H) 8 - 23 mg/dL   Creatinine, Ser  7.45 (H) 0.61 - 1.24 mg/dL   Calcium  8.7 (L) 8.9 - 10.3 mg/dL   GFR, Estimated 24 (L) >60 mL/min   Anion gap 13 5 - 15  Pro Brain natriuretic peptide     Status: Abnormal   Collection Time: 09/07/24  4:55 PM  Result Value Ref Range   Pro Brain Natriuretic Peptide 2,178.0 (H) <300.0 pg/mL  Resp panel by RT-PCR (RSV, Flu A&B, Covid) Anterior Nasal Swab     Status: None   Collection Time: 09/07/24  4:55 PM   Specimen: Anterior Nasal Swab  Result Value Ref Range   SARS Coronavirus 2 by RT PCR NEGATIVE NEGATIVE   Influenza A by PCR NEGATIVE NEGATIVE   Influenza B by PCR NEGATIVE NEGATIVE   Resp Syncytial Virus by PCR NEGATIVE NEGATIVE  Troponin T, High Sensitivity     Status: Abnormal   Collection Time: 09/07/24  4:55 PM  Result Value Ref Range   Troponin T High Sensitivity 52 (H) 0 - 19 ng/L  CBC with Differential     Status: Abnormal (Preliminary result)   Collection Time: 09/07/24 11:11 PM  Result Value Ref Range   WBC 26.3 (H) 4.0 - 10.5 K/uL   RBC 5.41 4.22 - 5.81 MIL/uL   Hemoglobin 14.6 13.0 - 17.0 g/dL   HCT 55.0 60.9 - 47.9 %   MCV 83.0 80.0 - 100.0 fL   MCH 27.0 26.0 - 34.0 pg   MCHC 32.5 30.0 - 36.0 g/dL   RDW 83.8 (H) 88.4 - 84.4 %   Platelets 427 (H) 150 - 400 K/uL   nRBC 0.0 0.0 - 0.2 %   Neutrophils Relative % PENDING %   Neutro Abs PENDING 1.7 - 7.7 K/uL   Band Neutrophils PENDING %   Lymphocytes Relative PENDING %   Lymphs Abs PENDING 0.7 - 4.0 K/uL   Monocytes Relative PENDING %   Monocytes Absolute PENDING 0.1 - 1.0 K/uL   Eosinophils Relative PENDING %   Eosinophils Absolute PENDING 0.0 - 0.5 K/uL   Basophils Relative PENDING %   Basophils Absolute PENDING 0.0 - 0.1 K/uL   WBC Morphology PENDING    RBC Morphology PENDING    Smear Review PENDING    Other PENDING %   nRBC PENDING 0 /100 WBC   Metamyelocytes Relative PENDING %   Myelocytes PENDING %   Promyelocytes Relative PENDING %   Blasts PENDING %   Immature Granulocytes PENDING %   Abs Immature  Granulocytes PENDING 0.00 - 0.07 K/uL  I-stat chem 8, ED (not at [facility], [facility] or [facility])     Status: Abnormal   Collection Time: 09/07/24 11:38 PM  Result Value Ref Range   Sodium 125 (L) 135 - 145 mmol/L   Potassium 3.0 (L) 3.5 - 5.1 mmol/L   Chloride 79 (L) 98 - 111 mmol/L   BUN 87 (H) 8 - 23 mg/dL   Creatinine, Ser 7.29 (H) 0.61 - 1.24 mg/dL   Glucose, Bld 825 (H) 70 - 99 mg/dL   Calcium , Ion 0.92 (L) 1.15 - 1.40 mmol/L   TCO2 32 22 - 32 mmol/L   Hemoglobin 16.0 13.0 - 17.0 g/dL   HCT 52.9 60.9 - 47.9 %  I-Stat Lactic Acid     Status: Abnormal   Collection Time: 09/07/24 11:39 PM  Result Value Ref Range   Lactic Acid, Venous 3.2 (HH) 0.5 - 1.9 mmol/L   Comment NOTIFIED PHYSICIAN    *  Note: Due to a large number of results and/or encounters for the requested time period, some results have not been displayed. A complete set of results can be found in Results Review.     Assessment and Plan: No notes have been filed under this hospital service. Service: Hospitalist     Advance Care Planning:   Code Status: Prior ***  Consults:   Family Communication: ***  Severity of Illness: {Observation/Inpatient:21159}  Author: ARTHEA CHILD, MD 09/07/2024 11:54 PM  For on call review [email or web address].     [1]  Allergies Allergen Reactions   Peanut (Diagnostic) Anaphylaxis   Peanut-Containing Drug Products Anaphylaxis   Sulfa Antibiotics Anaphylaxis   Sulfonamide Derivatives Anaphylaxis   Eliquis  [Apixaban ] Other (See Comments)    Back/hip pain   Norvasc  [Amlodipine ] Swelling    *lower extremity swelling   Xarelto  [Rivaroxaban ] Other (See Comments)    Back/hip pain   Zestril  [Lisinopril ] Cough   Lactose Other (See Comments)    Unknown reaction   "

## 2024-09-08 ENCOUNTER — Other Ambulatory Visit: Payer: Self-pay

## 2024-09-08 DIAGNOSIS — N390 Urinary tract infection, site not specified: Secondary | ICD-10-CM | POA: Diagnosis present

## 2024-09-08 DIAGNOSIS — J45909 Unspecified asthma, uncomplicated: Secondary | ICD-10-CM | POA: Diagnosis present

## 2024-09-08 DIAGNOSIS — N1831 Chronic kidney disease, stage 3a: Secondary | ICD-10-CM

## 2024-09-08 DIAGNOSIS — Z951 Presence of aortocoronary bypass graft: Secondary | ICD-10-CM | POA: Diagnosis not present

## 2024-09-08 DIAGNOSIS — I10 Essential (primary) hypertension: Secondary | ICD-10-CM | POA: Diagnosis not present

## 2024-09-08 DIAGNOSIS — I5022 Chronic systolic (congestive) heart failure: Secondary | ICD-10-CM

## 2024-09-08 DIAGNOSIS — I350 Nonrheumatic aortic (valve) stenosis: Secondary | ICD-10-CM | POA: Diagnosis present

## 2024-09-08 DIAGNOSIS — I951 Orthostatic hypotension: Secondary | ICD-10-CM | POA: Diagnosis not present

## 2024-09-08 DIAGNOSIS — I130 Hypertensive heart and chronic kidney disease with heart failure and stage 1 through stage 4 chronic kidney disease, or unspecified chronic kidney disease: Secondary | ICD-10-CM | POA: Diagnosis present

## 2024-09-08 DIAGNOSIS — E1169 Type 2 diabetes mellitus with other specified complication: Secondary | ICD-10-CM | POA: Diagnosis not present

## 2024-09-08 DIAGNOSIS — N179 Acute kidney failure, unspecified: Secondary | ICD-10-CM | POA: Diagnosis present

## 2024-09-08 DIAGNOSIS — E039 Hypothyroidism, unspecified: Secondary | ICD-10-CM | POA: Diagnosis present

## 2024-09-08 DIAGNOSIS — E871 Hypo-osmolality and hyponatremia: Secondary | ICD-10-CM | POA: Diagnosis present

## 2024-09-08 DIAGNOSIS — A498 Other bacterial infections of unspecified site: Secondary | ICD-10-CM | POA: Diagnosis not present

## 2024-09-08 DIAGNOSIS — Z952 Presence of prosthetic heart valve: Secondary | ICD-10-CM | POA: Diagnosis not present

## 2024-09-08 DIAGNOSIS — Z1152 Encounter for screening for COVID-19: Secondary | ICD-10-CM | POA: Diagnosis not present

## 2024-09-08 DIAGNOSIS — I480 Paroxysmal atrial fibrillation: Secondary | ICD-10-CM | POA: Diagnosis present

## 2024-09-08 DIAGNOSIS — R6520 Severe sepsis without septic shock: Secondary | ICD-10-CM | POA: Diagnosis present

## 2024-09-08 DIAGNOSIS — G4733 Obstructive sleep apnea (adult) (pediatric): Secondary | ICD-10-CM | POA: Diagnosis present

## 2024-09-08 DIAGNOSIS — Z7901 Long term (current) use of anticoagulants: Secondary | ICD-10-CM | POA: Diagnosis not present

## 2024-09-08 DIAGNOSIS — E1122 Type 2 diabetes mellitus with diabetic chronic kidney disease: Secondary | ICD-10-CM | POA: Diagnosis present

## 2024-09-08 DIAGNOSIS — Z794 Long term (current) use of insulin: Secondary | ICD-10-CM | POA: Diagnosis not present

## 2024-09-08 DIAGNOSIS — B9620 Unspecified Escherichia coli [E. coli] as the cause of diseases classified elsewhere: Secondary | ICD-10-CM | POA: Diagnosis present

## 2024-09-08 DIAGNOSIS — Z1612 Extended spectrum beta lactamase (ESBL) resistance: Secondary | ICD-10-CM | POA: Diagnosis present

## 2024-09-08 DIAGNOSIS — Z95810 Presence of automatic (implantable) cardiac defibrillator: Secondary | ICD-10-CM | POA: Diagnosis not present

## 2024-09-08 DIAGNOSIS — E872 Acidosis, unspecified: Secondary | ICD-10-CM | POA: Diagnosis present

## 2024-09-08 DIAGNOSIS — E876 Hypokalemia: Secondary | ICD-10-CM | POA: Diagnosis present

## 2024-09-08 DIAGNOSIS — E860 Dehydration: Secondary | ICD-10-CM | POA: Diagnosis present

## 2024-09-08 DIAGNOSIS — E66811 Obesity, class 1: Secondary | ICD-10-CM | POA: Diagnosis present

## 2024-09-08 DIAGNOSIS — Z66 Do not resuscitate: Secondary | ICD-10-CM | POA: Diagnosis present

## 2024-09-08 DIAGNOSIS — I5042 Chronic combined systolic (congestive) and diastolic (congestive) heart failure: Secondary | ICD-10-CM | POA: Diagnosis present

## 2024-09-08 DIAGNOSIS — D508 Other iron deficiency anemias: Secondary | ICD-10-CM | POA: Diagnosis not present

## 2024-09-08 DIAGNOSIS — A419 Sepsis, unspecified organism: Secondary | ICD-10-CM | POA: Diagnosis present

## 2024-09-08 LAB — COMPREHENSIVE METABOLIC PANEL WITH GFR
ALT: 32 U/L (ref 0–44)
AST: 28 U/L (ref 15–41)
Albumin: 2.8 g/dL — ABNORMAL LOW (ref 3.5–5.0)
Alkaline Phosphatase: 137 U/L — ABNORMAL HIGH (ref 38–126)
Anion gap: 14 (ref 5–15)
BUN: 76 mg/dL — ABNORMAL HIGH (ref 8–23)
CO2: 28 mmol/L (ref 22–32)
Calcium: 8.1 mg/dL — ABNORMAL LOW (ref 8.9–10.3)
Chloride: 85 mmol/L — ABNORMAL LOW (ref 98–111)
Creatinine, Ser: 2.36 mg/dL — ABNORMAL HIGH (ref 0.61–1.24)
GFR, Estimated: 26 mL/min — ABNORMAL LOW
Glucose, Bld: 146 mg/dL — ABNORMAL HIGH (ref 70–99)
Potassium: 3.2 mmol/L — ABNORMAL LOW (ref 3.5–5.1)
Sodium: 127 mmol/L — ABNORMAL LOW (ref 135–145)
Total Bilirubin: 0.5 mg/dL (ref 0.0–1.2)
Total Protein: 6.8 g/dL (ref 6.5–8.1)

## 2024-09-08 LAB — CBC WITH DIFFERENTIAL/PLATELET
Basophils Relative: 0.1 % (ref 0.0–0.1)
Eosinophils Absolute: 0 % (ref 0.0–0.5)
Eosinophils Relative: 0.1 % (ref 0.0–0.5)
HCT: 44.9 % (ref 39.0–52.0)
Hemoglobin: 14.6 g/dL (ref 13.0–17.0)
Lymphocytes Relative: 11 %
Lymphs Abs: 2.8 % (ref 0.7–4.0)
MCH: 27 pg (ref 26.0–34.0)
MCHC: 32.5 g/dL (ref 30.0–36.0)
MCV: 83 fL (ref 80.0–100.0)
Monocytes Absolute: 1 % (ref 0.1–1.0)
Monocytes Relative: 1.7 % — AB (ref 0.1–1.0)
Monocytes Relative: 6 % (ref 0.7–4.0)
Neutro Abs: 21.4 K/uL — AB (ref 1.7–7.7)
Neutrophils Relative %: 81 %
Other: 0.28 % — AB (ref 0.00–0.07)
Platelets: 427 K/uL — ABNORMAL HIGH (ref 150–400)
RBC: 5.41 MIL/uL (ref 4.22–5.81)
RDW: 16.1 % — ABNORMAL HIGH (ref 11.5–15.5)
Smear Review: 1 %
Smear Review: NORMAL
WBC: 26.3 K/uL — ABNORMAL HIGH (ref 4.0–10.5)
nRBC: 0 % (ref 0.0–0.2)

## 2024-09-08 LAB — CBC
HCT: 40.3 % (ref 39.0–52.0)
Hemoglobin: 13.3 g/dL (ref 13.0–17.0)
MCH: 27.1 pg (ref 26.0–34.0)
MCHC: 33 g/dL (ref 30.0–36.0)
MCV: 82.2 fL (ref 80.0–100.0)
Platelets: 365 K/uL (ref 150–400)
RBC: 4.9 MIL/uL (ref 4.22–5.81)
RDW: 15.9 % — ABNORMAL HIGH (ref 11.5–15.5)
WBC: 22.1 K/uL — ABNORMAL HIGH (ref 4.0–10.5)
nRBC: 0 % (ref 0.0–0.2)

## 2024-09-08 LAB — URINALYSIS, ROUTINE W REFLEX MICROSCOPIC
Bilirubin Urine: NEGATIVE
Glucose, UA: NEGATIVE mg/dL
Ketones, ur: NEGATIVE mg/dL
Nitrite: NEGATIVE
Protein, ur: 30 mg/dL — AB
Specific Gravity, Urine: 1.011 (ref 1.005–1.030)
WBC, UA: >50 WBC/hpf (ref 0–5)
pH: 5 (ref 5.0–8.0)

## 2024-09-08 LAB — LACTIC ACID, PLASMA
Lactic Acid, Venous: 3.7 mmol/L (ref 0.5–1.9)
Lactic Acid, Venous: 2.5 mmol/L (ref 0.5–1.9)

## 2024-09-08 LAB — TROPONIN T, HIGH SENSITIVITY: Troponin T High Sensitivity: 47 ng/L — ABNORMAL HIGH (ref 0–19)

## 2024-09-08 MED ORDER — DABIGATRAN ETEXILATE MESYLATE 150 MG PO CAPS
150.0000 mg | ORAL_CAPSULE | Freq: Two times a day (BID) | ORAL | Status: DC
  Administered 2024-09-08 – 2024-09-14 (×14): 150 mg via ORAL
  Filled 2024-09-08 (×16): qty 1

## 2024-09-08 MED ORDER — ALLOPURINOL 100 MG PO TABS
100.0000 mg | ORAL_TABLET | Freq: Every day | ORAL | Status: DC
  Administered 2024-09-08 – 2024-09-14 (×7): 100 mg via ORAL
  Filled 2024-09-08 (×7): qty 1

## 2024-09-08 MED ORDER — ONDANSETRON HCL 4 MG PO TABS: 4.0000 mg | ORAL_TABLET | Freq: Four times a day (QID) | ORAL | Status: DC | PRN

## 2024-09-08 MED ORDER — PANTOPRAZOLE SODIUM 40 MG PO TBEC
40.0000 mg | DELAYED_RELEASE_TABLET | Freq: Every day | ORAL | Status: DC
  Administered 2024-09-08 – 2024-09-10 (×3): 40 mg via ORAL
  Filled 2024-09-08 (×3): qty 1

## 2024-09-08 MED ORDER — DOCUSATE SODIUM 100 MG PO CAPS
100.0000 mg | ORAL_CAPSULE | Freq: Two times a day (BID) | ORAL | Status: DC
  Administered 2024-09-08 – 2024-09-14 (×13): 100 mg via ORAL
  Filled 2024-09-08 (×13): qty 1

## 2024-09-08 MED ORDER — ATORVASTATIN CALCIUM 20 MG PO TABS
20.0000 mg | ORAL_TABLET | Freq: Every day | ORAL | Status: DC
  Administered 2024-09-08 – 2024-09-14 (×7): 20 mg via ORAL
  Filled 2024-09-08: qty 2
  Filled 2024-09-08 (×4): qty 1
  Filled 2024-09-08: qty 2
  Filled 2024-09-08: qty 1

## 2024-09-08 MED ORDER — AZITHROMYCIN 250 MG PO TABS: 500.0000 mg | ORAL_TABLET | Freq: Every day | ORAL | Status: DC

## 2024-09-08 MED ORDER — ASPIRIN 81 MG PO CHEW
81.0000 mg | CHEWABLE_TABLET | Freq: Every day | ORAL | Status: DC
  Administered 2024-09-08 – 2024-09-14 (×7): 81 mg via ORAL
  Filled 2024-09-08 (×7): qty 1

## 2024-09-08 MED ORDER — BENZONATATE 100 MG PO CAPS
100.0000 mg | ORAL_CAPSULE | Freq: Three times a day (TID) | ORAL | Status: DC
  Administered 2024-09-08 – 2024-09-14 (×19): 100 mg via ORAL
  Filled 2024-09-08 (×19): qty 1

## 2024-09-08 MED ORDER — SODIUM CHLORIDE 0.9 % IV SOLN
2.0000 g | INTRAVENOUS | Status: DC
  Administered 2024-09-08 – 2024-09-09 (×2): 2 g via INTRAVENOUS
  Filled 2024-09-08 (×2): qty 20

## 2024-09-08 MED ORDER — BISACODYL 5 MG PO TBEC
5.0000 mg | DELAYED_RELEASE_TABLET | Freq: Every day | ORAL | Status: DC | PRN
  Administered 2024-09-10: 5 mg via ORAL
  Filled 2024-09-08: qty 1

## 2024-09-08 MED ORDER — LEVOTHYROXINE SODIUM 50 MCG PO TABS
175.0000 ug | ORAL_TABLET | Freq: Every day | ORAL | Status: DC
  Administered 2024-09-08 – 2024-09-14 (×7): 175 ug via ORAL
  Filled 2024-09-08 (×7): qty 1

## 2024-09-08 MED ORDER — SODIUM CHLORIDE 0.9% FLUSH
3.0000 mL | Freq: Two times a day (BID) | INTRAVENOUS | Status: DC
  Administered 2024-09-08 – 2024-09-14 (×14): 3 mL via INTRAVENOUS

## 2024-09-08 MED ORDER — AMIODARONE HCL 200 MG PO TABS
200.0000 mg | ORAL_TABLET | Freq: Every day | ORAL | Status: DC
  Administered 2024-09-08 – 2024-09-14 (×7): 200 mg via ORAL
  Filled 2024-09-08 (×7): qty 1

## 2024-09-08 MED ORDER — VENLAFAXINE HCL ER 37.5 MG PO CP24
37.5000 mg | ORAL_CAPSULE | Freq: Every day | ORAL | Status: DC
  Administered 2024-09-08 – 2024-09-14 (×7): 37.5 mg via ORAL
  Filled 2024-09-08 (×7): qty 1

## 2024-09-08 MED ORDER — OXYCODONE HCL 5 MG PO TABS: 5.0000 mg | ORAL_TABLET | Freq: Three times a day (TID) | ORAL | Status: DC | PRN

## 2024-09-08 MED ORDER — ACETAMINOPHEN 650 MG RE SUPP: 650.0000 mg | Freq: Four times a day (QID) | RECTAL | Status: DC | PRN

## 2024-09-08 MED ORDER — ACETAMINOPHEN 325 MG PO TABS: 650.0000 mg | ORAL_TABLET | Freq: Four times a day (QID) | ORAL | Status: DC | PRN

## 2024-09-08 MED ORDER — UMECLIDINIUM BROMIDE 62.5 MCG/ACT IN AEPB
1.0000 | INHALATION_SPRAY | Freq: Every day | RESPIRATORY_TRACT | Status: DC
  Administered 2024-09-08 – 2024-09-14 (×7): 1 via RESPIRATORY_TRACT
  Filled 2024-09-08 (×2): qty 7

## 2024-09-08 MED ORDER — TAMSULOSIN HCL 0.4 MG PO CAPS
0.4000 mg | ORAL_CAPSULE | Freq: Every day | ORAL | Status: DC
  Administered 2024-09-08 – 2024-09-13 (×7): 0.4 mg via ORAL
  Filled 2024-09-08 (×7): qty 1

## 2024-09-08 MED ORDER — ALBUTEROL SULFATE (2.5 MG/3ML) 0.083% IN NEBU
2.5000 mg | INHALATION_SOLUTION | RESPIRATORY_TRACT | Status: DC | PRN
  Administered 2024-09-09: 2.5 mg via RESPIRATORY_TRACT
  Filled 2024-09-08 (×2): qty 3

## 2024-09-08 MED ORDER — GUAIFENESIN ER 600 MG PO TB12
600.0000 mg | ORAL_TABLET | Freq: Two times a day (BID) | ORAL | Status: DC
  Administered 2024-09-08 – 2024-09-14 (×14): 600 mg via ORAL
  Filled 2024-09-08 (×13): qty 1

## 2024-09-08 MED ORDER — ONDANSETRON HCL 4 MG/2ML IJ SOLN: 4.0000 mg | Freq: Four times a day (QID) | INTRAMUSCULAR | Status: DC | PRN

## 2024-09-08 MED ORDER — TIOTROPIUM BROMIDE 2.5 MCG/ACT IN AERS: 2.0000 | INHALATION_SPRAY | Freq: Every day | RESPIRATORY_TRACT | Status: DC

## 2024-09-08 NOTE — Progress Notes (Addendum)
 Heart Failure Navigator Progress Note  Assessed for Heart & Vascular TOC clinic readiness.  Patient does not meet criteria due to he is an Advanced Heart Failure team patient of Dr. Zenaida .   Navigator will sign off at this time.   Stephane Haddock, BSN, Scientist, Clinical (histocompatibility And Immunogenetics) Only

## 2024-09-08 NOTE — Progress Notes (Signed)
 " Progress Note   Patient: Justin [name] FMW:[id number] DOB: Feb 10, 1934 DOA: 09/07/2024     0 DOS: the patient was seen and examined on 09/08/2024   Brief hospital course: Justin [name] is a 89 y.o. male with medical history significant for AICD placement, status post TAVR, history of atrial fibrillation on Pradaxa , OSA on CPAP, history of left pleural effusion status post thoracentesis, gout, CKD, baseline creatinine 1.65, DMT2, macular degeneration, hypertension, coronary artery disease, and hypothyroidism resident of assisted living seen by the provider yesterday because of an acute episode where he had dizziness and could not make it to the bathroom found to be orthostatic, diuretic held, blood work was obtained showed white blood cell count found to be 26k presented to ED for further evaluation.   In the emergency department patient white count 26K, lactic acid 3.2, potassium 3, creatinine 2.7, BNP 2178.  COVID flu RSV negative, patient was started on IV Rocephin  and azithromycin  admitted to hospitalist service for further management evaluation.  Assessment and Plan: AKI in the setting of dehydration, hypotension- Continue to hold diuretics, blood pressure medications. He was orthostatic in ED. Patient got IV fluid bolus, gentle IV fluids in the emergency department. Kidney function improving. Encourage oral hydration. Continue to monitor daily renal function.  Avoid nephrotoxic drugs.  Leukocytosis Elevated lactic acid level- Rule out infection. GI concerning for UTI.  Continue Rocephin  therapy. Chest x-ray unremarkable for infiltrate. Follow culture results.  Trend lactic acid level. If white count does not trend down will get CT chest abdomen pelvis and consider CLL/CML workup.  Hypokalemia: Continue oral repletion.  Hyponatremia- Sodium 127.  This seems chronic in the setting of CHF. Continue to trend sodium.  Type 2 diabetes mellitus: Continue Accu-Cheks, sliding scale  insulin .  CHF with mildly reduced EF: Caution with fluid overload.  Currently medications on hold due to hypotension.  A-fib: Continue amiodarone , Pradaxa .  Hold metoprolol  and diuretics due to low blood pressures.  OSA continue CPAP at night.  Obesity class I: BMI 34.87 Encourage diet, exercise and weight reduction.  Out of bed to chair. Incentive spirometry. Nursing supportive care. Fall, aspiration precautions. Diet:  Diet Orders (From admission, onward)     Start     Ordered   09/08/24 0203  Diet Carb Modified Room service appropriate? Yes  Diet effective now       Question Answer Comment  Diet-HS Snack? Nothing   Calorie Level Medium 1600-2000   Fluid consistency: Thin   Room service appropriate? Yes      09/08/24 0204           DVT prophylaxis:  dabigatran  (PRADAXA ) capsule 150 mg  Level of care: Telemetry   Code Status: Limited: Do not attempt resuscitation (DNR) -DNR-LIMITED -Do Not Intubate/DNI   Subjective: Patient is seen and examined today morning.  He is eating his breakfast, currently on 2 L supplemental oxygen.  Denies any abdominal pain, nausea or vomiting.  Has dry cough.  Physical Exam: Vitals:   09/08/24 0733 09/08/24 1030 09/08/24 1300 09/08/24 1458  BP: 119/80 113/62 131/77   Pulse: 94 97 94   Resp: 18 17 20    Temp:  (!) 97.4 F (36.3 C)  (!) 97.5 F (36.4 C)  TempSrc:      SpO2: 93% 94% 94%     General - Elderly obese Caucasian male, no apparent distress HEENT - PERRLA, EOMI, atraumatic head, non tender sinuses. Lung - Clear, basal rales, rhonchi, no wheezes. Heart - S1,  S2 heard, no murmurs, rubs, trace pedal edema. Abdomen - Soft, non tender, obese, bowel sounds heard Neuro - Alert, awake and oriented, non focal exam. Skin - Warm and dry.  Data Reviewed:      Latest Ref Rng & Units 09/08/2024    6:56 AM 09/07/2024   11:38 PM 09/07/2024   11:11 PM  CBC  WBC 4.0 - 10.5 K/uL 22.1   26.3   Hemoglobin 13.0 - 17.0 g/dL 86.6  83.9   85.3   Hematocrit 39.0 - 52.0 % 40.3  47.0  44.9   Platelets 150 - 400 K/uL 365   427       Latest Ref Rng & Units 09/08/2024    6:56 AM 09/07/2024   11:38 PM 09/07/2024    4:55 PM  BMP  Glucose 70 - 99 mg/dL 853  825  809   BUN 8 - 23 mg/dL 76  87  85   Creatinine 0.61 - 1.24 mg/dL 7.63  7.29  7.45   Sodium 135 - 145 mmol/L 127  125  127   Potassium 3.5 - 5.1 mmol/L 3.2  3.0  2.7   Chloride 98 - 111 mmol/L 85  79  80   CO2 22 - 32 mmol/L 28   35   Calcium  8.9 - 10.3 mg/dL 8.1   8.7    DG Chest 2 View Result Date: 09/07/2024 EXAM: 2 VIEW(S) XRAY OF THE CHEST 09/07/2024 05:12:00 PM COMPARISON: 08/30/2024 CLINICAL HISTORY: FINDINGS: LUNGS AND PLEURA: No focal pulmonary opacity. No pleural effusion. No pneumothorax. HEART AND MEDIASTINUM: Heart size at upper limits. Stable left AICD. Aortic valve noted. Aortic atherosclerosis (ICD10-170.0). BONES AND SOFT TISSUES: Sternotomy wires noted. Vertebral endplate spurring at multiple levels in lower thoracic spine. IMPRESSION: 1. No acute findings. 2. Postsurgical changes of prior sternotomy with aortic valve replacement and stable left AICD. 3. Aortic atherosclerosis. 4. Heart size at the upper limits of normal. 5. Chronic vertebral endplate spurring in the lower thoracic spine. Electronically signed by: Dorethia Molt MD 09/07/2024 05:21 PM EST RP Workstation: [id number]    Family Communication: Discussed with patient, understand and agree. All questions answered.  Disposition: Status is: Inpatient Remains inpatient appropriate because: IV antibiotics, workup for elevated white count, monitor kidney function, electrolytes  Planned Discharge Destination: Rehab     Time spent: 46 minutes  Author: Concepcion Riser, MD 09/08/2024 3:10 PM Secure chat 7am to 7pm For on call review [email or web address].    "

## 2024-09-09 ENCOUNTER — Encounter: Payer: Self-pay | Admitting: Cardiovascular Disease

## 2024-09-09 DIAGNOSIS — R6520 Severe sepsis without septic shock: Secondary | ICD-10-CM

## 2024-09-09 DIAGNOSIS — I5022 Chronic systolic (congestive) heart failure: Secondary | ICD-10-CM | POA: Diagnosis not present

## 2024-09-09 DIAGNOSIS — N390 Urinary tract infection, site not specified: Secondary | ICD-10-CM

## 2024-09-09 DIAGNOSIS — E039 Hypothyroidism, unspecified: Secondary | ICD-10-CM | POA: Diagnosis not present

## 2024-09-09 DIAGNOSIS — A419 Sepsis, unspecified organism: Secondary | ICD-10-CM | POA: Diagnosis not present

## 2024-09-09 DIAGNOSIS — N179 Acute kidney failure, unspecified: Secondary | ICD-10-CM | POA: Diagnosis not present

## 2024-09-09 DIAGNOSIS — B9620 Unspecified Escherichia coli [E. coli] as the cause of diseases classified elsewhere: Secondary | ICD-10-CM

## 2024-09-09 LAB — RESPIRATORY PANEL BY PCR

## 2024-09-09 LAB — BASIC METABOLIC PANEL WITH GFR
Anion gap: 11 (ref 5–15)
BUN: 66 mg/dL — ABNORMAL HIGH (ref 8–23)
CO2: 30 mmol/L (ref 22–32)
Calcium: 8.7 mg/dL — ABNORMAL LOW (ref 8.9–10.3)
Chloride: 90 mmol/L — ABNORMAL LOW (ref 98–111)
Creatinine, Ser: 2.19 mg/dL — ABNORMAL HIGH (ref 0.61–1.24)
GFR, Estimated: 28 mL/min — ABNORMAL LOW
Glucose, Bld: 158 mg/dL — ABNORMAL HIGH (ref 70–99)
Potassium: 3.7 mmol/L (ref 3.5–5.1)
Sodium: 130 mmol/L — ABNORMAL LOW (ref 135–145)

## 2024-09-09 LAB — CBC
HCT: 40 % (ref 39.0–52.0)
Hemoglobin: 12.7 g/dL — ABNORMAL LOW (ref 13.0–17.0)
MCH: 27 pg (ref 26.0–34.0)
MCHC: 31.8 g/dL (ref 30.0–36.0)
MCV: 84.9 fL (ref 80.0–100.0)
Platelets: 370 K/uL (ref 150–400)
RBC: 4.71 MIL/uL (ref 4.22–5.81)
RDW: 16.4 % — ABNORMAL HIGH (ref 11.5–15.5)
WBC: 19.7 K/uL — ABNORMAL HIGH (ref 4.0–10.5)
nRBC: 0 % (ref 0.0–0.2)

## 2024-09-09 LAB — LACTIC ACID, PLASMA
Lactic Acid, Venous: 2 mmol/L (ref 0.5–1.9)
Lactic Acid, Venous: 3.6 mmol/L (ref 0.5–1.9)

## 2024-09-09 MED ORDER — ALUM & MAG HYDROXIDE-SIMETH 200-200-20 MG/5ML PO SUSP
30.0000 mL | ORAL | Status: DC | PRN
  Administered 2024-09-09 – 2024-09-11 (×3): 30 mL via ORAL
  Filled 2024-09-09 (×4): qty 30

## 2024-09-09 MED ORDER — CALCIUM CARBONATE ANTACID 500 MG PO CHEW
1.0000 | CHEWABLE_TABLET | Freq: Three times a day (TID) | ORAL | Status: DC
  Administered 2024-09-09 – 2024-09-14 (×15): 200 mg via ORAL
  Filled 2024-09-09 (×14): qty 1

## 2024-09-09 NOTE — Progress Notes (Signed)
" °   09/09/24 2315  BiPAP/CPAP/SIPAP  BiPAP/CPAP/SIPAP Pt Type Adult  BiPAP/CPAP/SIPAP Resmed  Mask Type Full face mask  Dentures removed? Not applicable  Respiratory Rate 17 breaths/min  Flow Rate 2 lpm (bled into circuit)  Patient Home Machine Yes  Safety Check Completed by RT for Home Unit Yes, no issues noted  Patient Home Mask Yes  Patient Home Tubing Yes  CPAP/SIPAP surface wiped down Yes  Device Plugged into RED Power Outlet Yes    "

## 2024-09-09 NOTE — Progress Notes (Signed)
 " Progress Note   Patient: Justin [name] FMW:[id number] DOB: [birth year] DOA: 09/07/2024     1 DOS: the patient was seen and examined on 09/09/2024   Brief hospital course: Justin [name] is a 89 y.o. male with medical history significant for AICD placement, status post TAVR, history of atrial fibrillation on Pradaxa , OSA on CPAP, history of left pleural effusion status post thoracentesis, gout, CKD, baseline creatinine 1.65, DMT2, macular degeneration, hypertension, coronary artery disease, and hypothyroidism resident of assisted living seen by the provider yesterday because of an acute episode where he had dizziness and could not make it to the bathroom found to be orthostatic, diuretic held, blood work was obtained showed white blood cell count found to be 26k presented to ED for further evaluation.   In the emergency department patient white count 26K, lactic acid 3.2, potassium 3, creatinine 2.7, BNP 2178.  COVID flu RSV negative, patient was started on IV Rocephin  and azithromycin  admitted to hospitalist service for further management evaluation.  Assessment and Plan: Acute on CKD stage 3a- in the setting of dehydration, hypotension- Continue to hold diuretics, blood pressure medications. He was orthostatic in ED. Patient got IV fluid bolus, gentle IV fluids in the emergency department. Kidney function improving, creatinine 2.19 today. Baseline seems around 1.5 to 2.  Encourage oral hydration. Continue to monitor daily renal function.  Avoid nephrotoxic drugs.  Severe sepsis- E coli UTI.  Patient presented with tachycardia, tachypnea, elevated white count, lactic acid Urine cultures grew E. coli.  Continue Rocephin  therapy. Chest x-ray unremarkable for infiltrate. But he does have severe dry cough. Check 20 patch respiratory panel. WBC trending down. Trend lactic acid.  Follow blood cultures, final urine susceptibilities.  Hypokalemia: Improved with oral  repletion.  Hyponatremia- Sodium 130. This seems chronic in the setting of CHF. Continue to trend sodium.  Type 2 diabetes mellitus: A1c 6.2. Continue accu-cheks, sliding scale insulin .  CHF with mildly reduced EF: Caution with fluid overload. Currently medications on hold due to hypotension.  A-fib: Continue amiodarone , Pradaxa .  Hold metoprolol  and diuretics due to low blood pressures.  OSA continue CPAP at night.  Obesity class I: BMI 34.87 Encourage diet, exercise and weight reduction.  Out of bed to chair. Incentive spirometry. Nursing supportive care. Fall, aspiration precautions.  Diet:  Diet Orders (From admission, onward)     Start     Ordered   09/08/24 0203  Diet Carb Modified Room service appropriate? Yes  Diet effective now       Question Answer Comment  Diet-HS Snack? Nothing   Calorie Level Medium 1600-2000   Fluid consistency: Thin   Room service appropriate? Yes      09/08/24 0204           DVT prophylaxis:  dabigatran  (PRADAXA ) capsule 150 mg  Level of care: Telemetry   Code Status: Limited: Do not attempt resuscitation (DNR) -DNR-LIMITED -Do Not Intubate/DNI   Subjective: Patient is seen and examined today morning.  He is getting out of bed with help. Has dry cough. Eating fair. Currently on room air. No overnight issues.  Physical Exam: Vitals:   09/09/24 1030 09/09/24 1106 09/09/24 1107 09/09/24 1252  BP: 124/82 128/77  121/83  Pulse:  99  93  Resp: 16 (!) 25  14  Temp: 98.8 F (37.1 C)  (!) 97.5 F (36.4 C) 98.1 F (36.7 C)  TempSrc: Oral  Oral   SpO2: 96% 100%  100%    General - Elderly obese  Caucasian male, distress due to cough HEENT - PERRLA, EOMI, atraumatic head, non tender sinuses. Lung - Clear, basal rales, rhonchi, no wheezes. Heart - S1, S2 heard, no murmurs, rubs, trace pedal edema. Abdomen - Soft, non tender, obese, bowel sounds heard Neuro - Alert, awake and oriented, non focal exam. Skin - Warm and dry.  Data  Reviewed:      Latest Ref Rng & Units 09/09/2024    7:15 AM 09/08/2024    6:56 AM 09/07/2024   11:38 PM  CBC  WBC 4.0 - 10.5 K/uL 19.7  22.1    Hemoglobin 13.0 - 17.0 g/dL 87.2  86.6  83.9   Hematocrit 39.0 - 52.0 % 40.0  40.3  47.0   Platelets 150 - 400 K/uL 370  365        Latest Ref Rng & Units 09/09/2024    7:15 AM 09/08/2024    6:56 AM 09/07/2024   11:38 PM  BMP  Glucose 70 - 99 mg/dL 841  853  825   BUN 8 - 23 mg/dL 66  76  87   Creatinine 0.61 - 1.24 mg/dL 7.80  7.63  7.29   Sodium 135 - 145 mmol/L 130  127  125   Potassium 3.5 - 5.1 mmol/L 3.7  3.2  3.0   Chloride 98 - 111 mmol/L 90  85  79   CO2 22 - 32 mmol/L 30  28    Calcium  8.9 - 10.3 mg/dL 8.7  8.1     DG Chest 2 View Result Date: 09/07/2024 EXAM: 2 VIEW(S) XRAY OF THE CHEST 09/07/2024 05:12:00 PM COMPARISON: 08/30/2024 CLINICAL HISTORY: FINDINGS: LUNGS AND PLEURA: No focal pulmonary opacity. No pleural effusion. No pneumothorax. HEART AND MEDIASTINUM: Heart size at upper limits. Stable left AICD. Aortic valve noted. Aortic atherosclerosis (ICD10-170.0). BONES AND SOFT TISSUES: Sternotomy wires noted. Vertebral endplate spurring at multiple levels in lower thoracic spine. IMPRESSION: 1. No acute findings. 2. Postsurgical changes of prior sternotomy with aortic valve replacement and stable left AICD. 3. Aortic atherosclerosis. 4. Heart size at the upper limits of normal. 5. Chronic vertebral endplate spurring in the lower thoracic spine. Electronically signed by: Dorethia Molt MD 09/07/2024 05:21 PM EST RP Workstation: [id number]    Family Communication: Discussed with patient, understand and agree. All questions answered.  Disposition: Status is: Inpatient Remains inpatient appropriate because: IV antibiotics, sepsis, monitor kidney function, electrolytes  Planned Discharge Destination: Rehab     Time spent: 46 minutes  Author: Concepcion Riser, MD 09/09/2024 12:57 PM Secure chat 7am to 7pm For on call  review [email or web address]    "

## 2024-09-09 NOTE — Progress Notes (Signed)
" °   09/09/24 0013  BiPAP/CPAP/SIPAP  BiPAP/CPAP/SIPAP Pt Type Adult  BiPAP/CPAP/SIPAP Resmed  Mask Type Full face mask  Dentures removed? Not applicable  Respiratory Rate 18 breaths/min  Flow Rate 2 lpm (bled into circut)  Patient Home Machine Yes  Safety Check Completed by RT for Home Unit Yes, no issues noted  Patient Home Mask Yes  Patient Home Tubing Yes  CPAP/SIPAP surface wiped down Yes  Device Plugged into RED Power Outlet Yes  BiPAP/CPAP /SiPAP Vitals  Pulse Rate (!) 101  Resp 18  SpO2 100 %  MEWS Score/Color  MEWS Score 1  MEWS Score Color Green    "

## 2024-09-09 NOTE — Progress Notes (Signed)
 I called and spoke to [name], DPR. (Pt's daughter) [name] informed of [name]'s note and verbalized understanding. [name] also stated that the pt is in the hospital and would like [name] to take a look into his test results and give her opinion and advise on it.

## 2024-09-10 ENCOUNTER — Inpatient Hospital Stay (HOSPITAL_COMMUNITY)

## 2024-09-10 DIAGNOSIS — B9620 Unspecified Escherichia coli [E. coli] as the cause of diseases classified elsewhere: Secondary | ICD-10-CM | POA: Diagnosis not present

## 2024-09-10 DIAGNOSIS — E871 Hypo-osmolality and hyponatremia: Secondary | ICD-10-CM | POA: Diagnosis not present

## 2024-09-10 DIAGNOSIS — R6520 Severe sepsis without septic shock: Secondary | ICD-10-CM | POA: Diagnosis not present

## 2024-09-10 DIAGNOSIS — I5022 Chronic systolic (congestive) heart failure: Secondary | ICD-10-CM | POA: Diagnosis not present

## 2024-09-10 DIAGNOSIS — E039 Hypothyroidism, unspecified: Secondary | ICD-10-CM | POA: Diagnosis not present

## 2024-09-10 DIAGNOSIS — E876 Hypokalemia: Secondary | ICD-10-CM | POA: Diagnosis not present

## 2024-09-10 DIAGNOSIS — N390 Urinary tract infection, site not specified: Secondary | ICD-10-CM | POA: Diagnosis not present

## 2024-09-10 DIAGNOSIS — E1169 Type 2 diabetes mellitus with other specified complication: Secondary | ICD-10-CM | POA: Diagnosis not present

## 2024-09-10 DIAGNOSIS — N179 Acute kidney failure, unspecified: Secondary | ICD-10-CM | POA: Diagnosis not present

## 2024-09-10 DIAGNOSIS — N1831 Chronic kidney disease, stage 3a: Secondary | ICD-10-CM | POA: Diagnosis not present

## 2024-09-10 DIAGNOSIS — A419 Sepsis, unspecified organism: Secondary | ICD-10-CM | POA: Diagnosis not present

## 2024-09-10 DIAGNOSIS — I10 Essential (primary) hypertension: Secondary | ICD-10-CM | POA: Diagnosis not present

## 2024-09-10 LAB — URINE CULTURE: Culture: >=100000 — AB

## 2024-09-10 LAB — BASIC METABOLIC PANEL WITH GFR
Anion gap: 11 (ref 5–15)
BUN: 53 mg/dL — ABNORMAL HIGH (ref 8–23)
CO2: 31 mmol/L (ref 22–32)
Calcium: 8.6 mg/dL — ABNORMAL LOW (ref 8.9–10.3)
Chloride: 90 mmol/L — ABNORMAL LOW (ref 98–111)
Creatinine, Ser: 1.91 mg/dL — ABNORMAL HIGH (ref 0.61–1.24)
GFR, Estimated: 33 mL/min — ABNORMAL LOW
Glucose, Bld: 143 mg/dL — ABNORMAL HIGH (ref 70–99)
Potassium: 3.1 mmol/L — ABNORMAL LOW (ref 3.5–5.1)
Sodium: 132 mmol/L — ABNORMAL LOW (ref 135–145)

## 2024-09-10 LAB — CBC
HCT: 38.8 % — ABNORMAL LOW (ref 39.0–52.0)
Hemoglobin: 12.4 g/dL — ABNORMAL LOW (ref 13.0–17.0)
MCH: 26.7 pg (ref 26.0–34.0)
MCHC: 32 g/dL (ref 30.0–36.0)
MCV: 83.4 fL (ref 80.0–100.0)
Platelets: 330 K/uL (ref 150–400)
RBC: 4.65 MIL/uL (ref 4.22–5.81)
RDW: 16.6 % — ABNORMAL HIGH (ref 11.5–15.5)
WBC: 16.4 K/uL — ABNORMAL HIGH (ref 4.0–10.5)
nRBC: 0 % (ref 0.0–0.2)

## 2024-09-10 LAB — TROPONIN T, HIGH SENSITIVITY
Troponin T High Sensitivity: 47 ng/L — ABNORMAL HIGH (ref 0–19)
Troponin T High Sensitivity: 46 ng/L — ABNORMAL HIGH (ref 0–19)

## 2024-09-10 MED ORDER — POTASSIUM CHLORIDE CRYS ER 20 MEQ PO TBCR
40.0000 meq | EXTENDED_RELEASE_TABLET | Freq: Two times a day (BID) | ORAL | Status: AC
  Administered 2024-09-10 – 2024-09-11 (×4): 40 meq via ORAL
  Filled 2024-09-10 (×4): qty 2

## 2024-09-10 MED ORDER — METOPROLOL SUCCINATE ER 25 MG PO TB24
25.0000 mg | ORAL_TABLET | Freq: Every day | ORAL | Status: DC
  Administered 2024-09-10 – 2024-09-11 (×2): 25 mg via ORAL
  Filled 2024-09-10 (×2): qty 1

## 2024-09-10 MED ORDER — SODIUM CHLORIDE 0.9 % IV SOLN
1.0000 g | Freq: Two times a day (BID) | INTRAVENOUS | Status: DC
  Administered 2024-09-10 – 2024-09-14 (×9): 1 g via INTRAVENOUS
  Filled 2024-09-10 (×9): qty 20

## 2024-09-10 MED ORDER — PANTOPRAZOLE SODIUM 40 MG PO TBEC
40.0000 mg | DELAYED_RELEASE_TABLET | Freq: Two times a day (BID) | ORAL | Status: DC
  Administered 2024-09-10 – 2024-09-14 (×8): 40 mg via ORAL
  Filled 2024-09-10 (×8): qty 1

## 2024-09-10 NOTE — Progress Notes (Signed)
" °   09/10/24 2305  BiPAP/CPAP/SIPAP  BiPAP/CPAP/SIPAP Pt Type Adult  BiPAP/CPAP/SIPAP Resmed  Mask Type Full face mask  Dentures removed? Not applicable  Mask Size Large  Respiratory Rate 18 breaths/min  Flow Rate 2 lpm  Patient Home Machine Yes  Safety Check Completed by RT for Home Unit Yes, no issues noted  Patient Home Mask Yes  Patient Home Tubing Yes  Auto Titrate Yes  Minimum cmH2O 5 cmH2O  Maximum cmH2O 20 cmH2O  Nasal massage performed Yes  Device Plugged into RED Power Outlet Yes  BiPAP/CPAP /SiPAP Vitals  Resp 18  SpO2 99 %  Bilateral Breath Sounds Clear;Diminished    "

## 2024-09-10 NOTE — Progress Notes (Signed)
 " Progress Note   Patient: Justin [name] FMW:[id number] DOB: 27-Apr-1934 DOA: 09/07/2024     2 DOS: the patient was seen and examined on 09/10/2024   Brief hospital course: Justin [name] is a 89 y.o. male with medical history significant for AICD placement, status post TAVR, history of atrial fibrillation on Pradaxa , OSA on CPAP, history of left pleural effusion status post thoracentesis, gout, CKD, baseline creatinine 1.65, DMT2, macular degeneration, hypertension, coronary artery disease, and hypothyroidism resident of assisted living seen by the provider yesterday because of an acute episode where he had dizziness and could not make it to the bathroom found to be orthostatic, diuretic held, blood work was obtained showed white blood cell count found to be 26k presented to ED for further evaluation.   In the emergency department patient white count 26K, lactic acid 3.2, potassium 3, creatinine 2.7, BNP 2178.  COVID flu RSV negative, patient was started on IV Rocephin  and azithromycin  admitted to hospitalist service for further management evaluation.  Assessment and Plan: Acute on CKD stage 3a- in the setting of dehydration, hypotension- Continue to hold diuretics, blood pressure medications. He was orthostatic in ED. Patient got IV fluid bolus, gentle IV fluids in the emergency department. Kidney function improving, creatinine 2.19 today. Baseline seems around 1.5 to 2.  Encourage oral hydration. Continue to monitor daily renal function.  Avoid nephrotoxic drugs.  Severe sepsis- ESBL E coli UTI.  Patient presented with tachycardia, tachypnea, elevated white count, lactic acid Urine cultures grew ESBL E. coli. Discussed with pharmacy, Rocephin  changed to Meropenem . CT abdomen/ pelvis added to for kidney involvement. Chest x-ray unremarkable for infiltrate. But he does have severe dry cough. 20 path respiratory panel negative. Continue antitussives. WBC trending down. Lactic acid remains  high.   Hypokalemia: Improved with oral repletion.  Hyponatremia- Sodium 132. This seems chronic in the setting of CHF. Continue to trend sodium.  Type 2 diabetes mellitus: A1c 6.2. Continue accu-cheks, sliding scale insulin .  CHF with mildly reduced EF: Caution with fluid overload. Resumed beta blocker. Given his chest discomfort which he attributed to acid reflux, will get EKG, tropsx2 flat. Currently aldactone  on hold due to hypotension.  A-fib: HR 50-120. Continue amiodarone , Pradaxa .  25mg  Toprol  resumed, he takes 50mg  at home. Monitor BP.  OSA continue CPAP at night.  GERD - increased PPI to bid, Continue tums PRN.  Obesity class I: BMI 34.87 Encourage diet, exercise and weight reduction.  PT/ OT eval for dc plan. Out of bed to chair. Incentive spirometry. Nursing supportive care. Fall, aspiration precautions.  Diet:  Diet Orders (From admission, onward)     Start     Ordered   09/08/24 0203  Diet Carb Modified Room service appropriate? Yes  Diet effective now       Question Answer Comment  Diet-HS Snack? Nothing   Calorie Level Medium 1600-2000   Fluid consistency: Thin   Room service appropriate? Yes      09/08/24 0204           DVT prophylaxis:  dabigatran  (PRADAXA ) capsule 150 mg  Level of care: Telemetry   Code Status: Limited: Do not attempt resuscitation (DNR) -DNR-LIMITED -Do Not Intubate/DNI   Subjective: Patient is seen and examined today morning.  He is complaining of heart burn attributes to indigestion. Did not relieve with tums. Unable to eat.  Physical Exam: Vitals:   09/10/24 0335 09/10/24 0754 09/10/24 0936 09/10/24 1000  BP: (!) 110/91  139/73   Pulse: ROLLEN)  56  (!) 113 (!) 50  Resp: 15  20   Temp: 97.8 F (36.6 C)  98.2 F (36.8 C)   TempSrc:      SpO2: 96% 95% 100%     General - Elderly obese Caucasian male, distress due to pain HEENT - PERRLA, EOMI, atraumatic head, non tender sinuses. Lung - Clear, basal rales, rhonchi,  no wheezes. Heart - S1, S2 heard, no murmurs, rubs, trace pedal edema. Abdomen - Soft, non tender, obese, bowel sounds heard Neuro - Alert, awake and oriented, non focal exam. Skin - Warm and dry.  Data Reviewed:      Latest Ref Rng & Units 09/10/2024    5:13 AM 09/09/2024    7:15 AM 09/08/2024    6:56 AM  CBC  WBC 4.0 - 10.5 K/uL 16.4  19.7  22.1   Hemoglobin 13.0 - 17.0 g/dL 87.5  87.2  86.6   Hematocrit 39.0 - 52.0 % 38.8  40.0  40.3   Platelets 150 - 400 K/uL 330  370  365       Latest Ref Rng & Units 09/10/2024    5:13 AM 09/09/2024    7:15 AM 09/08/2024    6:56 AM  BMP  Glucose 70 - 99 mg/dL 856  841  853   BUN 8 - 23 mg/dL 53  66  76   Creatinine 0.61 - 1.24 mg/dL 8.08  7.80  7.63   Sodium 135 - 145 mmol/L 132  130  127   Potassium 3.5 - 5.1 mmol/L 3.1  3.7  3.2   Chloride 98 - 111 mmol/L 90  90  85   CO2 22 - 32 mmol/L 31  30  28    Calcium  8.9 - 10.3 mg/dL 8.6  8.7  8.1    No results found.   Family Communication: Discussed with patient, understand and agree. All questions answered.  Disposition: Status is: Inpatient Remains inpatient appropriate because: IV antibiotics, sepsis, monitor kidney function, electrolytes  Planned Discharge Destination: Rehab     Time spent: 44 minutes  Author: Concepcion Riser, MD 09/10/2024 1:17 PM Secure chat 7am to 7pm For on call review [email or web address]    "

## 2024-09-10 NOTE — Care Management Important Message (Signed)
 Important Message  Patient Details IM Letter given. Name: Justin [name] MRN: [id number] Date of Birth: [birth year]   Important Message Given:  Yes - Medicare IM     Melba Ates 09/10/2024, 4:03 PM

## 2024-09-10 NOTE — Plan of Care (Signed)
   Problem: Activity: Goal: Risk for activity intolerance will decrease Outcome: Progressing   Problem: Nutrition: Goal: Adequate nutrition will be maintained Outcome: Progressing

## 2024-09-11 DIAGNOSIS — A419 Sepsis, unspecified organism: Secondary | ICD-10-CM | POA: Diagnosis not present

## 2024-09-11 DIAGNOSIS — N179 Acute kidney failure, unspecified: Secondary | ICD-10-CM | POA: Diagnosis not present

## 2024-09-11 DIAGNOSIS — E039 Hypothyroidism, unspecified: Secondary | ICD-10-CM | POA: Diagnosis not present

## 2024-09-11 DIAGNOSIS — N390 Urinary tract infection, site not specified: Secondary | ICD-10-CM | POA: Diagnosis not present

## 2024-09-11 LAB — CBC
HCT: 38.3 % — ABNORMAL LOW (ref 39.0–52.0)
Hemoglobin: 12.3 g/dL — ABNORMAL LOW (ref 13.0–17.0)
MCH: 27.3 pg (ref 26.0–34.0)
MCHC: 32.1 g/dL (ref 30.0–36.0)
MCV: 85.1 fL (ref 80.0–100.0)
Platelets: 339 K/uL (ref 150–400)
RBC: 4.5 MIL/uL (ref 4.22–5.81)
RDW: 17.1 % — ABNORMAL HIGH (ref 11.5–15.5)
WBC: 14.4 K/uL — ABNORMAL HIGH (ref 4.0–10.5)
nRBC: 0 % (ref 0.0–0.2)

## 2024-09-11 LAB — LACTIC ACID, PLASMA: Lactic Acid, Venous: 1.6 mmol/L (ref 0.5–1.9)

## 2024-09-11 LAB — BASIC METABOLIC PANEL WITH GFR
Anion gap: 9 (ref 5–15)
BUN: 42 mg/dL — ABNORMAL HIGH (ref 8–23)
CO2: 33 mmol/L — ABNORMAL HIGH (ref 22–32)
Calcium: 9.2 mg/dL (ref 8.9–10.3)
Chloride: 95 mmol/L — ABNORMAL LOW (ref 98–111)
Creatinine, Ser: 1.65 mg/dL — ABNORMAL HIGH (ref 0.61–1.24)
GFR, Estimated: 40 mL/min — ABNORMAL LOW
Glucose, Bld: 134 mg/dL — ABNORMAL HIGH (ref 70–99)
Potassium: 3.8 mmol/L (ref 3.5–5.1)
Sodium: 136 mmol/L (ref 135–145)

## 2024-09-11 MED ORDER — POLYETHYLENE GLYCOL 3350 17 G PO PACK
17.0000 g | PACK | Freq: Every day | ORAL | Status: DC
  Administered 2024-09-11 – 2024-09-14 (×4): 17 g via ORAL
  Filled 2024-09-11 (×4): qty 1

## 2024-09-11 NOTE — Progress Notes (Signed)
 " Progress Note   Patient: Justin [name] FMW:[id number] DOB: [birth year] DOA: 09/07/2024     3 DOS: the patient was seen and examined on 09/11/2024   Brief hospital course: Justin [name] is a 89 y.o. male with medical history significant for AICD placement, status post TAVR, history of atrial fibrillation on Pradaxa , OSA on CPAP, history of left pleural effusion status post thoracentesis, gout, CKD, baseline creatinine 1.65, DMT2, macular degeneration, hypertension, coronary artery disease, and hypothyroidism resident of assisted living seen by the provider yesterday because of an acute episode where he had dizziness and could not make it to the bathroom found to be orthostatic, diuretic held, blood work was obtained showed white blood cell count found to be 26k presented to ED for further evaluation.   In the emergency department patient white count 26K, lactic acid 3.2, potassium 3, creatinine 2.7, BNP 2178.  COVID flu RSV negative, patient was started on IV Rocephin  and azithromycin  admitted to hospitalist service for further management evaluation.  Assessment and Plan: Acute on CKD stage 3a- in the setting of dehydration, sepsis, hypotension- Hold diuretics, blood pressure medications. He is orthostatic. Eating fair, will avoid IV fluids due to CHF. Kidney function improving, creatinine 1.65 Today. Baseline seems around 1.5 to 2.  Continue to monitor daily renal function.  Avoid nephrotoxic drugs.  Severe sepsis- ESBL E coli UTI.  Patient presented with tachycardia, tachypnea, elevated white count, lactic acid, AKI. Urine cultures grew ESBL E. coli. Continue Meropenem . CT abdomen/ pelvis show no pyelonephritis. WBC improving and lactic acid trended down. Chest x-ray unremarkable for infiltrate. Continue antitussives.  Hypokalemia: Improved with oral repletion.  Hyponatremia- Sodium 132. This seems chronic in the setting of CHF. Continue to trend sodium.  Type 2 diabetes  mellitus: A1c 6.2. Continue accu-cheks, sliding scale insulin .  CHF with mildly reduced EF: Caution with fluid overload. Resumed beta blocker. Given his chest discomfort which he attributed to acid reflux, will get EKG, tropsx2 flat. Currently aldactone  on hold due to hypotension.  A-fib: HR 50-120. Continue amiodarone , Pradaxa .  25mg  Toprol  resumed, he takes 50mg  at home. Monitor BP.  OSA continue CPAP at night.  GERD - Continue PPI to bid, tums PRN. Started miralax , continue colace, senna.  Obesity class I: BMI 34.87 Encourage diet, exercise and weight reduction.  PT/ OT eval advised outpatient PT. Out of bed to chair. Incentive spirometry. Nursing supportive care. Fall, aspiration precautions.  Diet:  Diet Orders (From admission, onward)     Start     Ordered   09/08/24 0203  Diet Carb Modified Room service appropriate? Yes  Diet effective now       Question Answer Comment  Diet-HS Snack? Nothing   Calorie Level Medium 1600-2000   Fluid consistency: Thin   Room service appropriate? Yes      09/08/24 0204           DVT prophylaxis:  dabigatran  (PRADAXA ) capsule 150 mg  Level of care: Telemetry   Code Status: Limited: Do not attempt resuscitation (DNR) -DNR-LIMITED -Do Not Intubate/DNI   Subjective: Patient is seen and examined today morning.  He is sitting in chair, complaining of heart burn and constipation. Eating fair. Discussed with daughter over his phone.  Physical Exam: Vitals:   09/10/24 1420 09/10/24 2136 09/10/24 2305 09/11/24 0438  BP: 132/72 125/86  118/74  Pulse: 80 (!) 105  95  Resp: 16 18 18 18   Temp: 98.3 F (36.8 C) 97.7 F (36.5 C)  97.6 F (  36.4 C)  TempSrc: Oral Oral  Oral  SpO2: 100% 100% 99% 97%    General - Elderly obese Caucasian male, distress due to pain HEENT - PERRLA, EOMI, atraumatic head, non tender sinuses. Lung - Clear, basal rales, rhonchi, no wheezes. Heart - S1, S2 heard, no murmurs, rubs, trace pedal  edema. Abdomen - Soft, non tender, obese, bowel sounds heard Neuro - Alert, awake and oriented, non focal exam. Skin - Warm and dry.  Data Reviewed:      Latest Ref Rng & Units 09/11/2024    4:57 AM 09/10/2024    5:13 AM 09/09/2024    7:15 AM  CBC  WBC 4.0 - 10.5 K/uL 14.4  16.4  19.7   Hemoglobin 13.0 - 17.0 g/dL 87.6  87.5  87.2   Hematocrit 39.0 - 52.0 % 38.3  38.8  40.0   Platelets 150 - 400 K/uL 339  330  370       Latest Ref Rng & Units 09/11/2024    4:57 AM 09/10/2024    5:13 AM 09/09/2024    7:15 AM  BMP  Glucose 70 - 99 mg/dL 865  856  841   BUN 8 - 23 mg/dL 42  53  66   Creatinine 0.61 - 1.24 mg/dL 8.34  8.08  7.80   Sodium 135 - 145 mmol/L 136  132  130   Potassium 3.5 - 5.1 mmol/L 3.8  3.1  3.7   Chloride 98 - 111 mmol/L 95  90  90   CO2 22 - 32 mmol/L 33  31  30   Calcium  8.9 - 10.3 mg/dL 9.2  8.6  8.7    CT ABDOMEN PELVIS WO CONTRAST Result Date: 09/10/2024 CLINICAL DATA:  Sepsis EXAM: CT ABDOMEN AND PELVIS WITHOUT CONTRAST TECHNIQUE: Multidetector CT imaging of the abdomen and pelvis was performed following the standard protocol without IV contrast. RADIATION DOSE REDUCTION: This exam was performed according to the departmental dose-optimization program which includes automated exposure control, adjustment of the mA and/or kV according to patient size and/or use of iterative reconstruction technique. COMPARISON:  01/20/2024 FINDINGS: Lower chest: Bilateral lower lobe interstitial thickening with architectural distortion and volume loss. Trace left pleural fluid or thickening. Mild cardiomegaly with prior TAVR. Median sternotomy with native coronary artery calcification. Pacer/ICD. Hepatobiliary: Normal liver. Normal gallbladder, without biliary ductal dilatation. Pancreas: Normal, without mass or ductal dilatation. Spleen: Normal in size, without focal abnormality. Adrenals/Urinary Tract: Normal adrenal glands. Mild renal cortical thinning bilaterally. No renal calculi or  hydronephrosis. No hydroureter or ureteric calculi. No bladder calculi. Stomach/Bowel: Tiny hiatal hernia. Colonic stool burden suggests constipation. Normal terminal ileum. Normal small bowel. Vascular/Lymphatic: Advanced aortic and branch vessel atherosclerosis. No abdominopelvic adenopathy. Reproductive: Normal prostate. Other: No free intraperitoneal air.  No significant free fluid. Musculoskeletal: Advanced lumbosacral spondylosis. IMPRESSION: 1. No acute process in the abdomen or pelvis. 2. Possible constipation. 3.  Aortic Atherosclerosis (ICD10-I70.0). Electronically Signed   By: Rockey Kilts M.D.   On: 09/10/2024 15:43     Family Communication: Discussed with patient, daughter over phone. He understand and agree. All questions answered.  Disposition: Status is: Inpatient Remains inpatient appropriate because: IV antibiotics, sepsis, monitor kidney function, electrolytes  Planned Discharge Destination: Rehab     Time spent: 45 minutes  Author: Concepcion Riser, MD 09/11/2024 12:53 PM Secure chat 7am to 7pm For on call review [email or web address]    "

## 2024-09-11 NOTE — Evaluation (Signed)
 Physical Therapy Evaluation Patient Details Name: Justin [name] MRN: [id number] DOB: Dec 22, 1933 Today's Date: 09/11/2024  History of Present Illness  Pt is an 89yo male presenting from his provider's office found to be orthostatic with standing BP of 87/56 symptomatic; with hypokalemia, hyponatremia, with overlying ESBL e Coli UTI; CT abdomen negative for acute findings. PMH: BPH, CAD, CHF, GERD, gout, HTN, HLD, hypothyroidism, hx of MI, PAF, hx of prostate cancer, s/p CABGx4 and TAVR, b/l hammer toe surgery, b/l TKA with R revision, hx of L PE s/p thoracentesis, s/p AICD placement, BM, OSA on CPAP  Clinical Impression  Pt admitted with above diagnosis; pt is a resident of [facility] utilizing rollator to walk in room/to meals and scooter for longer distances, assist with medication management, no physical assist nor falls lately. Pt currently with functional limitations due to the deficits listed below (see PT Problem List). Pt mod IND with bedmobility, CGA with transfers with RW, and CGA for ambulation with RW. Pt with positive and symptomatic orthostatic BP (see table below, informed RN); BP after seated recovery period with ankle pumps and BLE elevated 103/73, HR77. Pt will benefit from acute skilled PT to increase their independence and safety with mobility to allow discharge.    09/11/24 1135  Vital Signs  Patient Position (if appropriate) Orthostatic Vitals  Orthostatic Sitting  BP- Sitting 130/64  Pulse- Sitting 96 (100%)  Orthostatic Standing at 0 minutes  BP- Standing at 0 minutes 100/69  Pulse- Standing at 0 minutes 100  Orthostatic Standing at 3 minutes  BP- Standing at 3 minutes (!) 75/60  Pulse- Standing at 3 minutes 102          If plan is discharge home, recommend the following: A little help with walking and/or transfers;A little help with bathing/dressing/bathroom;Assistance with cooking/housework;Assist for transportation;Help with stairs or ramp for entrance    Can travel by private vehicle        Equipment Recommendations None recommended by PT  Recommendations for Other Services       Functional Status Assessment Patient has had a recent decline in their functional status and demonstrates the ability to make significant improvements in function in a reasonable and predictable amount of time.     Precautions / Restrictions Precautions Precautions: Fall Restrictions Weight Bearing Restrictions Per Provider Order: No      Mobility  Bed Mobility Overal bed mobility: Modified Independent                  Transfers Overall transfer level: Needs assistance Equipment used: Rolling walker (2 wheels) Transfers: Sit to/from Stand Sit to Stand: Contact guard assist           General transfer comment: Pt guided in STS from recliner, toilet, CGA to RW, no physical assist required.    Ambulation/Gait Ambulation/Gait assistance: Contact guard assist Gait Distance (Feet): 50 Feet Assistive device: Rolling walker (2 wheels) Gait Pattern/deviations: Trunk flexed, Wide base of support, Shuffle, Decreased stride length Gait velocity: decreased     General Gait Details: Pt instructd in gait training with RW and CGA for 87ft, then 77ft with 2-90deg and 1-180deg turn, shuffling steps with high degree of trunk flexion and cervical flexion, wtih increased distance experiencing increased WOB and SOB that recovers with PLB and recovery period.  Stairs            Wheelchair Mobility     Tilt Bed    Modified Rankin (Stroke Patients Only)  Balance Overall balance assessment: Needs assistance Sitting-balance support: Feet supported, No upper extremity supported Sitting balance-Leahy Scale: Good     Standing balance support: Reliant on assistive device for balance, During functional activity, Bilateral upper extremity supported Standing balance-Leahy Scale: Poor                               Pertinent  Vitals/Pain Pain Assessment Pain Assessment: No/denies pain    Home Living Family/patient expects to be discharged to:: Assisted living                 Home Equipment: Rollator (4 wheels);Shower seat;Grab bars - toilet;Grab bars - tub/shower;Hand held shower head Additional Comments: Pt is resident of [facility]. Pt reports occasionally using portable oxygen on 2L when ambulating but he tends to leave it at home. Pt reporting he receives PT at the ALF. Pt walks to meals with walker, electric scooter to visit wife.    Prior Function Prior Level of Function : Needs assist             Mobility Comments: pt reports utilizing rollator for mobility when ambulating to dining hall and around his home. For longer distances, pt uses a power chair. ADLs Comments: Pt reports needing assistance with showering, LB dressing and all IADLs.     Extremity/Trunk Assessment   Upper Extremity Assessment Upper Extremity Assessment: Defer to OT evaluation    Lower Extremity Assessment Lower Extremity Assessment: Overall WFL for tasks assessed (grossly 5/5)    Cervical / Trunk Assessment Cervical / Trunk Assessment: Normal  Communication   Communication Communication: No apparent difficulties    Cognition Arousal: Alert Behavior During Therapy: WFL for tasks assessed/performed   PT - Cognitive impairments: No apparent impairments, No family/caregiver present to determine baseline                         Following commands: Intact       Cueing Cueing Techniques: Verbal cues     General Comments      Exercises General Exercises - Lower Extremity Ankle Circles/Pumps: AROM, Both, 20 reps Hip Flexion/Marching: AROM, Both, 10 reps, Standing (3x10)   Assessment/Plan    PT Assessment Patient needs continued PT services  PT Problem List Decreased activity tolerance;Decreased balance;Decreased mobility;Cardiopulmonary status limiting activity       PT Treatment  Interventions DME instruction;Gait training;Functional mobility training;Therapeutic activities;Therapeutic exercise;Balance training;Patient/family education    PT Goals (Current goals can be found in the Care Plan section)  Acute Rehab PT Goals Patient Stated Goal: To get back to [facility] PT Goal Formulation: With patient Time For Goal Achievement: 09/25/24 Potential to Achieve Goals: Good Additional Goals Additional Goal #1: Pt will perform standing static and/or dynamic balanace activities >/=85min with no more than CGA without symptomatic drop in BP.    Frequency Min 1X/week     Co-evaluation               AM-PAC PT 6 Clicks Mobility  Outcome Measure Help needed turning from your back to your side while in a flat bed without using bedrails?: None Help needed moving from lying on your back to sitting on the side of a flat bed without using bedrails?: None Help needed moving to and from a bed to a chair (including a wheelchair)?: A Little Help needed standing up from a chair using your arms (e.g., wheelchair or bedside chair)?:  A Little Help needed to walk in hospital room?: A Little Help needed climbing 3-5 steps with a railing? : Total 6 Click Score: 18    End of Session Equipment Utilized During Treatment: Gait belt Activity Tolerance: Patient tolerated treatment well;No increased pain Patient left: in chair;with call bell/phone within reach;with chair alarm set Nurse Communication: Mobility status;Other (comment) (+ [place]) PT Visit Diagnosis: Dizziness and giddiness (R42)    Time: 8875-8843 PT Time Calculation (min) (ACUTE ONLY): 32 min   Charges:   PT Evaluation $PT Eval Low Complexity: 1 Low PT Treatments $Gait Training: 8-22 mins PT General Charges $$ ACUTE PT VISIT: 1 Visit         Elsie Grieves, PT, DPT [facility] Rehabilitation Department Office: [phone]  Elsie Grieves 09/11/2024, 11:57 AM

## 2024-09-11 NOTE — Evaluation (Signed)
 Occupational Therapy Evaluation Patient Details Name: Justin [name] MRN: [id number] DOB: 27-Oct-1933 Today's Date: 09/11/2024   History of Present Illness   Pt is an 89yo male presenting from his provider's office found to be orthostatic with standing BP of 87/56 symptomatic; with hypokalemia, hyponatremia, with overlying ESBL e Coli UTI; CT abdomen negative for acute findings. PMH: BPH, CAD, CHF, GERD, gout, HTN, HLD, hypothyroidism, hx of MI, PAF, hx of prostate cancer, s/p CABGx4 and TAVR, b/l hammer toe surgery, b/l TKA with R revision, hx of L PE s/p thoracentesis, s/p AICD placement, BM, OSA on CPAP     Clinical Impressions Pt typically walks in his ALF with a rollator and uses a scooter to visit his wife in another facility within [facility]. He is assisted to shower, for LB ADLs and all IADLs. Pt with low vision due to macular degeneration. Pt is likely very near his baseline. Provided CGA for short distance ambulation this date due to earlier orthostatic hypotension and poor vision.Will follow acutely, do not anticipate pt will require post acute OT.      If plan is discharge home, recommend the following:   A little help with walking and/or transfers;A lot of help with bathing/dressing/bathroom;Assistance with cooking/housework;Direct supervision/assist for medications management;Direct supervision/assist for financial management;Assist for transportation;Help with stairs or ramp for entrance     Functional Status Assessment   Patient has had a recent decline in their functional status and demonstrates the ability to make significant improvements in function in a reasonable and predictable amount of time.     Equipment Recommendations   None recommended by OT     Recommendations for Other Services         Precautions/Restrictions   Precautions Precautions: Fall Precaution/Restrictions Comments: watch for orthostatic hypotension Restrictions Weight  Bearing Restrictions Per Provider Order: No     Mobility Bed Mobility               General bed mobility comments: received and returned to chair    Transfers Overall transfer level: Needs assistance Equipment used: Rolling walker (2 wheels) Transfers: Sit to/from Stand Sit to Stand: Contact guard assist           General transfer comment: guided due to low vision and unfamiliar environment      Balance Overall balance assessment: Needs assistance   Sitting balance-Leahy Scale: Good     Standing balance support: Reliant on assistive device for balance, During functional activity, Bilateral upper extremity supported Standing balance-Leahy Scale: Poor                             ADL either performed or assessed with clinical judgement   ADL Overall ADL's : Needs assistance/impaired Eating/Feeding: Minimal assistance;Sitting Eating/Feeding Details (indicate cue type and reason): assist to open containers, set up tray Grooming: Oral care;Wash/dry hands;Sitting;Set up Grooming Details (indicate cue type and reason): set up to brush teeth and wash hands prior to lunch Upper Body Bathing: Minimal assistance;Sitting   Lower Body Bathing: Moderate assistance;Sit to/from stand   Upper Body Dressing : Minimal assistance;Sitting   Lower Body Dressing: Maximal assistance;Sit to/from stand   Toilet Transfer: Contact guard assist;Ambulation;Rolling walker (2 wheels)           Functional mobility during ADLs: Contact guard assist;Rolling walker (2 wheels)       Vision Baseline Vision/History: 6 Macular Degeneration Ability to See in Adequate Light: 3 Highly impaired Patient Visual  Report: No change from baseline Additional Comments: cannot see detail to read     Perception         Praxis         Pertinent Vitals/Pain Pain Assessment Pain Assessment: No/denies pain     Extremity/Trunk Assessment Upper Extremity Assessment Upper Extremity  Assessment: Overall WFL for tasks assessed;Right hand dominant   Lower Extremity Assessment Lower Extremity Assessment: Defer to PT evaluation   Cervical / Trunk Assessment Cervical / Trunk Assessment: Normal   Communication Communication Communication: No apparent difficulties   Cognition Arousal: Alert Behavior During Therapy: WFL for tasks assessed/performed Cognition: No apparent impairments                               Following commands: Intact       Cueing  General Comments   Cueing Techniques: Verbal cues      Exercises     Shoulder Instructions      Home Living Family/patient expects to be discharged to:: Assisted living                             Home Equipment: Rollator (4 wheels);Shower seat;Grab bars - toilet;Grab bars - tub/shower;Hand held shower head   Additional Comments: Pt is resident of [facility]. Pt reports occasionally using portable oxygen on 2L when ambulating but he tends to leave it at home. Pt reporting he receives PT at the ALF. Pt walks to meals with walker, electric scooter to visit wife.      Prior Functioning/Environment Prior Level of Function : Needs assist             Mobility Comments: pt reports utilizing rollator for mobility when ambulating to dining hall and around his home. For longer distances, pt uses a power chair. ADLs Comments: Pt reports needing assistance with showering, LB dressing and all IADLs.    OT Problem List: Decreased activity tolerance;Impaired balance (sitting and/or standing);Impaired vision/perception   OT Treatment/Interventions: Self-care/ADL training;DME and/or AE instruction;Patient/family education;Balance training;Therapeutic activities      OT Goals(Current goals can be found in the care plan section)   Acute Rehab OT Goals OT Goal Formulation: With patient Time For Goal Achievement: 09/25/24 Potential to Achieve Goals: Good ADL Goals Pt Will Perform  Grooming: with supervision;standing Pt Will Transfer to Toilet: with supervision;ambulating Pt Will Perform Toileting - Clothing Manipulation and hygiene: with supervision;sit to/from stand   OT Frequency:  Min 2X/week    Co-evaluation              AM-PAC OT 6 Clicks Daily Activity     Outcome Measure Help from another person eating meals?: A Little Help from another person taking care of personal grooming?: A Little Help from another person toileting, which includes using toliet, bedpan, or urinal?: A Little Help from another person bathing (including washing, rinsing, drying)?: A Lot Help from another person to put on and taking off regular upper body clothing?: A Little Help from another person to put on and taking off regular lower body clothing?: A Lot 6 Click Score: 16   End of Session Equipment Utilized During Treatment: Gait belt;Rolling walker (2 wheels)  Activity Tolerance: Patient tolerated treatment well Patient left: in chair;with call bell/phone within reach;with chair alarm set  OT Visit Diagnosis: Unsteadiness on feet (R26.81);Other abnormalities of gait and mobility (R26.89);Low vision, both eyes (H54.2);Dizziness and giddiness (R42)  Time: 8786-8765 OT Time Calculation (min): 21 min Charges:  OT General Charges $OT Visit: 1 Visit OT Evaluation $OT Eval Moderate Complexity: 1 Mod  [name], OTR/L Acute Rehabilitation Services Office: [phone]  Kennth Mliss Helling 09/11/2024, 12:56 PM

## 2024-09-11 NOTE — Plan of Care (Signed)

## 2024-09-12 DIAGNOSIS — A419 Sepsis, unspecified organism: Secondary | ICD-10-CM | POA: Diagnosis not present

## 2024-09-12 DIAGNOSIS — E039 Hypothyroidism, unspecified: Secondary | ICD-10-CM | POA: Diagnosis not present

## 2024-09-12 DIAGNOSIS — N179 Acute kidney failure, unspecified: Secondary | ICD-10-CM | POA: Diagnosis not present

## 2024-09-12 DIAGNOSIS — N390 Urinary tract infection, site not specified: Secondary | ICD-10-CM | POA: Diagnosis not present

## 2024-09-12 LAB — CBC
HCT: 39.6 % (ref 39.0–52.0)
Hemoglobin: 12.3 g/dL — ABNORMAL LOW (ref 13.0–17.0)
MCH: 27 pg (ref 26.0–34.0)
MCHC: 31.1 g/dL (ref 30.0–36.0)
MCV: 86.8 fL (ref 80.0–100.0)
Platelets: 340 K/uL (ref 150–400)
RBC: 4.56 MIL/uL (ref 4.22–5.81)
RDW: 17.1 % — ABNORMAL HIGH (ref 11.5–15.5)
WBC: 17.9 K/uL — ABNORMAL HIGH (ref 4.0–10.5)
nRBC: 0 % (ref 0.0–0.2)

## 2024-09-12 LAB — BASIC METABOLIC PANEL WITH GFR
Anion gap: 7 (ref 5–15)
BUN: 40 mg/dL — ABNORMAL HIGH (ref 8–23)
CO2: 30 mmol/L (ref 22–32)
Calcium: 9.1 mg/dL (ref 8.9–10.3)
Chloride: 95 mmol/L — ABNORMAL LOW (ref 98–111)
Creatinine, Ser: 1.72 mg/dL — ABNORMAL HIGH (ref 0.61–1.24)
GFR, Estimated: 38 mL/min — ABNORMAL LOW
Glucose, Bld: 140 mg/dL — ABNORMAL HIGH (ref 70–99)
Potassium: 5.2 mmol/L — ABNORMAL HIGH (ref 3.5–5.1)
Sodium: 132 mmol/L — ABNORMAL LOW (ref 135–145)

## 2024-09-12 NOTE — TOC Initial Note (Addendum)
 Transition of Care [facility]) - Initial/Assessment Note    Patient Details  Name: Justin [name] MRN: [id number] Date of Birth: 1934-07-04  [facility] CM/SW Contact:    Doneta Glenys DASEN, RN Phone Number: 09/12/2024, 10:18 AM  Clinical Narrative:                 [facility] [place]. CM called to confirm residence. CM spoke with [facility], [facility] POA [phone]). DME-rollator,scooter,oxygen. Emmie will transport at discharge. FL2 started. IP CM following  Expected Discharge Plan: Assisted Living ([facility]) Barriers to Discharge: Continued Medical Work up   Patient Goals and CMS Choice Patient states their goals for this hospitalization and ongoing recovery are:: [facility] [place] CMS Medicare.gov Compare Post Acute Care list provided to:: Patient Represenative (must comment) [facility] POA) Choice offered to / list presented to : [facility] POA / Guardian [place] ownership interest in [facility].provided to:: [facility] POA / Guardian    Expected Discharge Plan and Services In-house Referral: NA Discharge Planning Services: CM Consult   Living arrangements for the past 2 months: Assisted Living Facility                 DME Arranged: N/A DME Agency: NA       HH Arranged: NA HH Agency: NA        Prior Living Arrangements/Services Living arrangements for the past 2 months: Assisted Living Facility Lives with:: Facility Resident Patient language and need for interpreter reviewed:: Yes Do you feel safe going back to the place where you live?: Yes      Need for Family Participation in Patient Care: Yes (Comment) Care giver support system in place?: Yes (comment) Current home services: DME (rollator, scooter, oxygen) Criminal Activity/Legal Involvement Pertinent to Current Situation/Hospitalization: No - Comment as needed  Activities of Daily Living   ADL Screening (condition at time of admission) Independently performs ADLs?: Yes (appropriate  for developmental age)  Permission Sought/Granted Permission sought to share information with : Case Manager Permission granted to share information with : Yes, Verbal Permission Granted  Share Information with NAME: Mariano, Doshi  Daughter, Emergency Contact  [phone] (Mobile)  Permission granted to share info w AGENCY: [facility]        Emotional Assessment Appearance:: Appears stated age Attitude/Demeanor/Rapport: Engaged Affect (typically observed): Appropriate Orientation: : Oriented to Situation, Oriented to  Time, Oriented to Place, Oriented to Self Alcohol / Substance Use: Not Applicable Psych Involvement: No (comment)  Admission diagnosis:  AKI (acute kidney injury) [N17.9] Acute gout of left hand, unspecified cause [M10.9] Acute sepsis (HCC) [A41.9] Patient Active Problem List   Diagnosis Date Noted   Severe sepsis (HCC) 09/09/2024   AKI (acute kidney injury) 09/08/2024   Hypokalemia 09/07/2024   Orthostatic hypotension 08/03/2024   Acute renal failure superimposed on stage 3a chronic kidney disease (HCC) 08/03/2024   Mild cognitive impairment 08/02/2024   Dry skin dermatitis 07/30/2024   COPD (chronic obstructive pulmonary disease) (HCC) 05/27/2024   Hyponatremia 02/16/2024   E. coli UTI 12/19/2023   Recurrent depression 12/19/2023   Candidiasis 12/15/2023   Pleural effusion 11/20/2023   Asymptomatic bacteriuria 11/20/2023   Bronchitis 11/06/2023   Type 2 diabetes mellitus (HCC) 10/23/2023   Leukocytosis 10/16/2023   Right ventricular dysfunction 07/05/2023   Chronic kidney disease, stage 3a (HCC) 07/05/2023   Persistent atrial fibrillation (HCC) 07/05/2023   Long term (current) use of anticoagulants 07/05/2023   NSVT (nonsustained ventricular tachycardia) (HCC) 05/23/2022   Sinus  node dysfunction (HCC) 05/23/2022   Frequent PVCs 05/23/2022   Biventricular implantable cardioverter-defibrillator (ICD) in situ 03/12/2022   Atypical atrial flutter (HCC)  01/30/2022   Secondary hypercoagulable state 04/19/2021   Raynaud's phenomenon without gangrene 07/11/2020   Vertigo 07/11/2020   Loosening of knee joint prosthesis 05/18/2019   Cough 04/30/2019   Gastroesophageal reflux disease 04/30/2019   S/P ICD (internal cardiac defibrillator) procedure 05/03/2017   NICM (nonischemic cardiomyopathy) (HCC) 05/02/2017   History of transcatheter aortic valve replacement (TAVR) 03/04/2017   Severe aortic valve stenosis 03/04/2017   Hypokalemia due to loss of potassium with diuresis 01/10/2017   Aortic stenosis 01/10/2017   Chronic systolic CHF (congestive heart failure) (HCC) 01/10/2017   Chronic congestive heart failure (HCC) 01/07/2017   Hypothyroidism 12/23/2016   Prostate cancer (HCC)    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   Edema of both legs 09/01/2014   Asthma, intermittent 09/01/2014   Epiphora due to insufficient drainage 07/02/2011   Rosacea blepharoconjunctivitis 07/02/2011   Coronary artery disease involving native coronary artery of native heart without angina pectoris    GOUT 09/12/2009   CAD, AUTOLOGOUS BYPASS GRAFT 05/10/2009   MURMUR 05/08/2009   BRUIT 05/08/2009   MUSCLE CRAMPS 12/12/2008   KNEE SPRAIN 09/21/2008   Osteoarthritis 07/07/2008   BPH with urinary obstruction 02/01/2008   LOW BACK PAIN SYNDROME 02/01/2008   TESTOSTERONE DEFICIENCY 07/03/2007   Hyperlipidemia 07/03/2007   Essential hypertension 07/03/2007   MYOCARDIAL INFARCTION, HX OF 07/03/2007   Allergic rhinitis 07/03/2007   History of colonic polyps 07/03/2007   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   PCP:  Mast, Man X, NP Pharmacy:   [facility] [place], [place] - [address] [place] [address] Phone: [phone] Fax: [phone]     Social Drivers of Health (SDOH) Social History: SDOH Screenings   Food Insecurity: No Food Insecurity (09/09/2024)  Housing: Unknown (09/09/2024)  Transportation Needs:  Patient Declined (09/09/2024)  Utilities: Not At Risk (09/09/2024)  Alcohol Screen: Low Risk (03/31/2023)  Depression (PHQ2-9): Low Risk (04/05/2024)  Financial Resource Strain: Low Risk (03/31/2023)  Physical Activity: Sufficiently Active (03/31/2023)  Social Connections: Patient Declined (09/09/2024)  Stress: No Stress Concern Present (03/31/2023)  Tobacco Use: Medium Risk (09/06/2024)  Health Literacy: Adequate Health Literacy (03/31/2023)   SDOH Interventions:     Readmission Risk Interventions    09/12/2024   10:13 AM 08/05/2024   11:56 AM 11/19/2023    1:00 PM  Readmission Risk Prevention Plan  Transportation Screening Complete Complete Complete  PCP or Specialist Appt within 3-5 Days   Complete  HRI or Home Care Consult   Complete  Social Work Consult for Recovery Care Planning/Counseling   Complete  Palliative Care Screening   Not Applicable  Medication Review Oceanographer) Complete Complete Complete  PCP or Specialist appointment within 3-5 days of discharge Complete Complete   HRI or Home Care Consult Complete Complete   SW Recovery Care/Counseling Consult Complete Complete   Palliative Care Screening Not Applicable Not Applicable   Skilled Nursing Facility Complete Not Applicable

## 2024-09-12 NOTE — Progress Notes (Signed)
 Physical Therapy Treatment Patient Details Name: Justin [name] MRN: [id number] DOB: 1934-06-09 Today's Date: 09/12/2024   History of Present Illness Pt is an 89yo male presenting from his provider's office found to be orthostatic with standing BP of 87/56 symptomatic; with hypokalemia, hyponatremia, with overlying ESBL e Coli UTI; CT abdomen negative for acute findings. PMH: BPH, CAD, CHF, GERD, gout, HTN, HLD, hypothyroidism, hx of MI, PAF, hx of prostate cancer, s/p CABGx4 and TAVR, b/l hammer toe surgery, b/l TKA with R revision, hx of L PE s/p thoracentesis, s/p AICD placement, BM, OSA on CPAP    PT Comments  Pt very cooperative this am and with noted improvement in activity tolerance.  Pt up to ambulate 200' with RW and CGA for safety.  Pt denies dizziness with BP supine 111/69; sitting 116/71, standing 102/67; standing 3 min 102/64 and after ambulating and settling in bathroom 127/101.  Pt hopeful for dc home soon.    If plan is discharge home, recommend the following: A little help with walking and/or transfers;A little help with bathing/dressing/bathroom;Assistance with cooking/housework;Assist for transportation;Help with stairs or ramp for entrance   Can travel by private vehicle        Equipment Recommendations  None recommended by PT    Recommendations for Other Services       Precautions / Restrictions Precautions Precautions: Fall Precaution/Restrictions Comments: watch for orthostatic hypotension Restrictions Weight Bearing Restrictions Per Provider Order: No     Mobility  Bed Mobility               General bed mobility comments: received and returned to chair    Transfers Overall transfer level: Needs assistance Equipment used: Rolling walker (2 wheels) Transfers: Sit to/from Stand Sit to Stand: Supervision           General transfer comment: Min cues for use of UEs    Ambulation/Gait Ambulation/Gait assistance: Contact guard assist Gait  Distance (Feet): 200 Feet Assistive device: Rolling walker (2 wheels) Gait Pattern/deviations: Trunk flexed, Wide base of support, Shuffle, Decreased stride length Gait velocity: decreased     General Gait Details: CUes for posture and position from RW.   Stairs             Wheelchair Mobility     Tilt Bed    Modified Rankin (Stroke Patients Only)       Balance Overall balance assessment: Needs assistance Sitting-balance support: Feet supported, No upper extremity supported Sitting balance-Leahy Scale: Good     Standing balance support: No upper extremity supported Standing balance-Leahy Scale: Fair                              Hotel Manager: No apparent difficulties  Cognition Arousal: Alert Behavior During Therapy: WFL for tasks assessed/performed   PT - Cognitive impairments: No apparent impairments, No family/caregiver present to determine baseline                         Following commands: Intact      Cueing Cueing Techniques: Verbal cues  Exercises      General Comments        Pertinent Vitals/Pain Pain Assessment Pain Assessment: No/denies pain    Home Living                          Prior Function  PT Goals (current goals can now be found in the care plan section) Acute Rehab PT Goals Patient Stated Goal: To get back to [facility] PT Goal Formulation: With patient Time For Goal Achievement: 09/25/24 Potential to Achieve Goals: Good Progress towards PT goals: Progressing toward goals    Frequency    Min 1X/week      PT Plan      Co-evaluation              AM-PAC PT 6 Clicks Mobility   Outcome Measure  Help needed turning from your back to your side while in a flat bed without using bedrails?: None Help needed moving from lying on your back to sitting on the side of a flat bed without using bedrails?: None Help needed moving to and from a  bed to a chair (including a wheelchair)?: A Little Help needed standing up from a chair using your arms (e.g., wheelchair or bedside chair)?: A Little Help needed to walk in hospital room?: A Little Help needed climbing 3-5 steps with a railing? : Total 6 Click Score: 18    End of Session Equipment Utilized During Treatment: Gait belt Activity Tolerance: Patient tolerated treatment well;No increased pain Patient left: Other (comment) (bathroom with CNA aware) Nurse Communication: Mobility status;Other (comment) (pt in bathroom) PT Visit Diagnosis: Unsteadiness on feet (R26.81);Difficulty in walking, not elsewhere classified (R26.2)     Time: 9046-8977 PT Time Calculation (min) (ACUTE ONLY): 29 min  Charges:    $Gait Training: 23-37 mins PT General Charges $$ ACUTE PT VISIT: 1 Visit                     [facility] PT Acute Rehabilitation Services Office [phone]    Delsin Copen 09/12/2024, 2:41 PM

## 2024-09-12 NOTE — Progress Notes (Signed)
 " Progress Note   Patient: Justin [name] FMW:[id number] DOB: 10/03/1933 DOA: 09/07/2024     4 DOS: the patient was seen and examined on 09/12/2024   Brief hospital course: Justin [name] is a 89 y.o. male with medical history significant for AICD placement, status post TAVR, history of atrial fibrillation on Pradaxa , OSA on CPAP, history of left pleural effusion status post thoracentesis, gout, CKD, baseline creatinine 1.65, DMT2, macular degeneration, hypertension, coronary artery disease, and hypothyroidism resident of assisted living seen by the provider yesterday because of an acute episode where he had dizziness and could not make it to the bathroom found to be orthostatic, diuretic held, blood work was obtained showed white blood cell count found to be 26k presented to ED for further evaluation.   In the emergency department patient white count 26K, lactic acid 3.2, potassium 3, creatinine 2.7, BNP 2178.  COVID flu RSV negative, patient was started on IV Rocephin  and azithromycin  admitted to hospitalist service for further management evaluation.  Assessment and Plan: Acute on CKD stage 3a- in the setting of dehydration, sepsis, hypotension- Hold diuretics, blood pressure medications. He is persistently orthostatic. Eating fair, no more IV fluids. Hold antihypertensives. Kidney function improving, creatinine 1.79 today. Baseline seems around 1.5 to 2.  Continue to monitor daily renal function.  Avoid nephrotoxic drugs.  Severe sepsis- ESBL E coli UTI.  Patient presented with tachycardia, tachypnea, elevated white count, lactic acid, AKI. Urine cultures grew ESBL E. coli. Continue Meropenem  for total 5 days. CT abdomen/ pelvis show no pyelonephritis. WBC improving and lactic acid trended down. Chest x-ray unremarkable for infiltrate. Continue antitussives.  Hypokalemia: K today elevated. No more supplements needed.  Hyponatremia- Sodium 132. Chronic in the setting of  CHF. Continue to trend sodium.  Type 2 diabetes mellitus: A1c 6.2. Continue accu-cheks, sliding scale insulin .  CHF with mildly reduced EF: Caution with fluid overload.  Currently beta blocker, aldactone  on hold due to hypotension.  A-fib: HR 50-120. Continue amiodarone , Pradaxa .  Due to orthostasis held beta blocker. Monitor BP.  OSA continue CPAP at night.  GERD - Continue PPI to bid, tums PRN. Started miralax , continue colace, senna.  Obesity class I: BMI 34.87 Encourage diet, exercise and weight reduction.  PT/ OT eval advised outpatient PT. Out of bed to chair. Incentive spirometry. Nursing supportive care. Fall, aspiration precautions.  Diet:  Diet Orders (From admission, onward)     Start     Ordered   09/08/24 0203  Diet Carb Modified Room service appropriate? Yes  Diet effective now       Question Answer Comment  Diet-HS Snack? Nothing   Calorie Level Medium 1600-2000   Fluid consistency: Thin   Room service appropriate? Yes      09/08/24 0204           DVT prophylaxis:  dabigatran  (PRADAXA ) capsule 150 mg  Level of care: Telemetry   Code Status: Limited: Do not attempt resuscitation (DNR) -DNR-LIMITED -Do Not Intubate/DNI   Subjective: Patient is seen and examined today morning.  He is sitting in chair. Eating fair. No complaints of chest pain or epigastric pain.  Physical Exam: Vitals:   09/11/24 2046 09/12/24 0400 09/12/24 0912 09/12/24 1137  BP: (!) 140/93 (!) 150/80  115/68  Pulse: (!) 50 82  82  Resp: 18     Temp: 97.8 F (36.6 C) (!) 97.5 F (36.4 C)    TempSrc: Oral Oral    SpO2: 100% 98% 98% 100%  General - Elderly obese Caucasian male, no acute distress. HEENT - PERRLA, EOMI, atraumatic head, non tender sinuses. Lung - Clear, basal rales, rhonchi, no wheezes. Heart - S1, S2 heard, no murmurs, rubs, trace pedal edema. Abdomen - Soft, non tender, obese, bowel sounds heard Neuro - Alert, awake and oriented, non focal exam. Skin  - Warm and dry.  Data Reviewed:      Latest Ref Rng & Units 09/12/2024    5:03 AM 09/11/2024    4:57 AM 09/10/2024    5:13 AM  CBC  WBC 4.0 - 10.5 K/uL 17.9  14.4  16.4   Hemoglobin 13.0 - 17.0 g/dL 87.6  87.6  87.5   Hematocrit 39.0 - 52.0 % 39.6  38.3  38.8   Platelets 150 - 400 K/uL 340  339  330       Latest Ref Rng & Units 09/12/2024    5:03 AM 09/11/2024    4:57 AM 09/10/2024    5:13 AM  BMP  Glucose 70 - 99 mg/dL 859  865  856   BUN 8 - 23 mg/dL 40  42  53   Creatinine 0.61 - 1.24 mg/dL 8.27  8.34  8.08   Sodium 135 - 145 mmol/L 132  136  132   Potassium 3.5 - 5.1 mmol/L 5.2  3.8  3.1   Chloride 98 - 111 mmol/L 95  95  90   CO2 22 - 32 mmol/L 30  33  31   Calcium  8.9 - 10.3 mg/dL 9.1  9.2  8.6    CT ABDOMEN PELVIS WO CONTRAST Result Date: 09/10/2024 CLINICAL DATA:  Sepsis EXAM: CT ABDOMEN AND PELVIS WITHOUT CONTRAST TECHNIQUE: Multidetector CT imaging of the abdomen and pelvis was performed following the standard protocol without IV contrast. RADIATION DOSE REDUCTION: This exam was performed according to the departmental dose-optimization program which includes automated exposure control, adjustment of the mA and/or kV according to patient size and/or use of iterative reconstruction technique. COMPARISON:  01/20/2024 FINDINGS: Lower chest: Bilateral lower lobe interstitial thickening with architectural distortion and volume loss. Trace left pleural fluid or thickening. Mild cardiomegaly with prior TAVR. Median sternotomy with native coronary artery calcification. Pacer/ICD. Hepatobiliary: Normal liver. Normal gallbladder, without biliary ductal dilatation. Pancreas: Normal, without mass or ductal dilatation. Spleen: Normal in size, without focal abnormality. Adrenals/Urinary Tract: Normal adrenal glands. Mild renal cortical thinning bilaterally. No renal calculi or hydronephrosis. No hydroureter or ureteric calculi. No bladder calculi. Stomach/Bowel: Tiny hiatal hernia. Colonic stool  burden suggests constipation. Normal terminal ileum. Normal small bowel. Vascular/Lymphatic: Advanced aortic and branch vessel atherosclerosis. No abdominopelvic adenopathy. Reproductive: Normal prostate. Other: No free intraperitoneal air.  No significant free fluid. Musculoskeletal: Advanced lumbosacral spondylosis. IMPRESSION: 1. No acute process in the abdomen or pelvis. 2. Possible constipation. 3.  Aortic Atherosclerosis (ICD10-I70.0). Electronically Signed   By: Rockey Kilts M.D.   On: 09/10/2024 15:43     Family Communication: Discussed with patient. He understand and agree. All questions answered.  Disposition: Status is: Inpatient Remains inpatient appropriate because: IV antibiotics, sepsis, monitor kidney function, electrolytes  Planned Discharge Destination: Rehab     Time spent: 46 minutes  Author: Concepcion Riser, MD 09/12/2024 2:53 PM Secure chat 7am to 7pm For on call review [email or web address]    "

## 2024-09-12 NOTE — Plan of Care (Signed)
   Problem: Activity: Goal: Risk for activity intolerance will decrease Outcome: Progressing   Problem: Nutrition: Goal: Adequate nutrition will be maintained Outcome: Progressing   Problem: Coping: Goal: Level of anxiety will decrease Outcome: Progressing   Problem: Elimination: Goal: Will not experience complications related to bowel motility Outcome: Progressing

## 2024-09-12 NOTE — Plan of Care (Signed)

## 2024-09-13 ENCOUNTER — Ambulatory Visit

## 2024-09-13 DIAGNOSIS — I5022 Chronic systolic (congestive) heart failure: Secondary | ICD-10-CM | POA: Diagnosis not present

## 2024-09-13 DIAGNOSIS — E876 Hypokalemia: Secondary | ICD-10-CM | POA: Diagnosis not present

## 2024-09-13 DIAGNOSIS — E871 Hypo-osmolality and hyponatremia: Secondary | ICD-10-CM | POA: Diagnosis not present

## 2024-09-13 DIAGNOSIS — E1169 Type 2 diabetes mellitus with other specified complication: Secondary | ICD-10-CM | POA: Diagnosis not present

## 2024-09-13 DIAGNOSIS — A419 Sepsis, unspecified organism: Secondary | ICD-10-CM | POA: Diagnosis not present

## 2024-09-13 DIAGNOSIS — E039 Hypothyroidism, unspecified: Secondary | ICD-10-CM | POA: Diagnosis not present

## 2024-09-13 DIAGNOSIS — N179 Acute kidney failure, unspecified: Secondary | ICD-10-CM | POA: Diagnosis not present

## 2024-09-13 DIAGNOSIS — B9620 Unspecified Escherichia coli [E. coli] as the cause of diseases classified elsewhere: Secondary | ICD-10-CM | POA: Diagnosis not present

## 2024-09-13 DIAGNOSIS — N390 Urinary tract infection, site not specified: Secondary | ICD-10-CM | POA: Diagnosis not present

## 2024-09-13 DIAGNOSIS — N1831 Chronic kidney disease, stage 3a: Secondary | ICD-10-CM | POA: Diagnosis not present

## 2024-09-13 DIAGNOSIS — R6520 Severe sepsis without septic shock: Secondary | ICD-10-CM | POA: Diagnosis not present

## 2024-09-13 DIAGNOSIS — I10 Essential (primary) hypertension: Secondary | ICD-10-CM | POA: Diagnosis not present

## 2024-09-13 LAB — BASIC METABOLIC PANEL WITH GFR
Anion gap: 8 (ref 5–15)
BUN: 34 mg/dL — ABNORMAL HIGH (ref 8–23)
CO2: 28 mmol/L (ref 22–32)
Calcium: 9.1 mg/dL (ref 8.9–10.3)
Chloride: 96 mmol/L — ABNORMAL LOW (ref 98–111)
Creatinine, Ser: 1.52 mg/dL — ABNORMAL HIGH (ref 0.61–1.24)
GFR, Estimated: 44 mL/min — ABNORMAL LOW
Glucose, Bld: 118 mg/dL — ABNORMAL HIGH (ref 70–99)
Potassium: 4.3 mmol/L (ref 3.5–5.1)
Sodium: 133 mmol/L — ABNORMAL LOW (ref 135–145)

## 2024-09-13 LAB — RETICULOCYTES
Immature Retic Fract: 26.5 % — ABNORMAL HIGH (ref 2.3–15.9)
RBC.: 4.83 MIL/uL (ref 4.22–5.81)
Retic Count, Absolute: 136.2 K/uL (ref 19.0–186.0)
Retic Ct Pct: 2.8 % (ref 0.4–3.1)

## 2024-09-13 LAB — VITAMIN B12: Vitamin B-12: 1244 pg/mL — ABNORMAL HIGH (ref 180–914)

## 2024-09-13 LAB — CULTURE, BLOOD (ROUTINE X 2)
Culture: NO GROWTH
Culture: NO GROWTH
Special Requests: ADEQUATE
Special Requests: ADEQUATE

## 2024-09-13 LAB — FOLATE: Folate: 13.3 ng/mL

## 2024-09-13 LAB — CBC
HCT: 39 % (ref 39.0–52.0)
Hemoglobin: 12.3 g/dL — ABNORMAL LOW (ref 13.0–17.0)
MCH: 27.2 pg (ref 26.0–34.0)
MCHC: 31.5 g/dL (ref 30.0–36.0)
MCV: 86.3 fL (ref 80.0–100.0)
Platelets: 304 K/uL (ref 150–400)
RBC: 4.52 MIL/uL (ref 4.22–5.81)
RDW: 17.2 % — ABNORMAL HIGH (ref 11.5–15.5)
WBC: 13.3 K/uL — ABNORMAL HIGH (ref 4.0–10.5)
nRBC: 0 % (ref 0.0–0.2)

## 2024-09-13 LAB — IRON AND TIBC
Iron: 45 ug/dL (ref 45–182)
Saturation Ratios: 13 % — ABNORMAL LOW (ref 17.9–39.5)
TIBC: 353 ug/dL (ref 250–450)
UIBC: 308 ug/dL

## 2024-09-13 LAB — FERRITIN: Ferritin: 217 ng/mL (ref 24–336)

## 2024-09-13 MED ORDER — FERROUS SULFATE 325 (65 FE) MG PO TABS
325.0000 mg | ORAL_TABLET | Freq: Every day | ORAL | Status: DC
  Administered 2024-09-14: 325 mg via ORAL
  Filled 2024-09-13: qty 1

## 2024-09-13 NOTE — Progress Notes (Signed)
 Looks like he is currently admitted for acute on chronic kidney disease and sepsis secondary to UTI. WBC and kidney function is improving. Hospitalist saw him today.

## 2024-09-13 NOTE — Progress Notes (Signed)
 " Progress Note   Patient: Justin [name] FMW:[id number] DOB: [birth year] DOA: 09/07/2024     5 DOS: the patient was seen and examined on 09/13/2024   Brief hospital course: Justin [name] is a 89 y.o. male with medical history significant for AICD placement, status post TAVR, history of atrial fibrillation on Pradaxa , OSA on CPAP, history of left pleural effusion status post thoracentesis, gout, CKD, baseline creatinine 1.65, DMT2, macular degeneration, hypertension, coronary artery disease, and hypothyroidism resident of assisted living seen by the provider yesterday because of an acute episode where he had dizziness and could not make it to the bathroom found to be orthostatic, diuretic held, blood work was obtained showed white blood cell count found to be 26k presented to ED for further evaluation.   In the emergency department patient white count 26K, lactic acid 3.2, potassium 3, creatinine 2.7, BNP 2178.  COVID flu RSV negative, patient was started on IV Rocephin  and azithromycin  admitted to hospitalist service for further management evaluation.  Assessment and Plan: Acute on CKD stage 3a- in the setting of dehydration, sepsis, hypotension- Hold diuretics, blood pressure medications. He is persistently orthostatic. Eating fair, no more IV fluids. Hold antihypertensives. Kidney function improved and back to baseline around 1.5 to 2.  Continue to monitor daily renal function.  Avoid nephrotoxic drugs.  Severe sepsis- ESBL E coli UTI.  Patient presented with tachycardia, tachypnea, elevated white count, lactic acid, AKI. Urine cultures grew ESBL E. coli. Continue Meropenem  for total 5 days. CT abdomen/ pelvis show no pyelonephritis. WBC improving and lactic acid trended down. Chest x-ray unremarkable for infiltrate. Continue antitussives.  Hypokalemia: K improved. No more supplements needed.  Hyponatremia- Sodium 133. Chronic in the setting of CHF. Continue to trend  sodium.  Type 2 diabetes mellitus: A1c 6.2. Continue accu-cheks, sliding scale insulin .  CHF with mildly reduced EF: Caution with fluid overload.  Currently beta blocker, aldactone  on hold due to hypotension.  A-fib: HR 50-120. Continue amiodarone , Pradaxa .  Continue to hold beta blocker. Monitor BP.  OSA continue CPAP at night.  GERD - Continue PPI to bid, tums PRN. Started miralax , continue colace, senna.  Obesity class I: BMI 34.87 Encourage diet, exercise and weight reduction.  PT/ OT eval advised outpatient PT. Out of bed to chair. Incentive spirometry. Nursing supportive care. Fall, aspiration precautions.  Diet:  Diet Orders (From admission, onward)     Start     Ordered   09/08/24 0203  Diet Carb Modified Room service appropriate? Yes  Diet effective now       Question Answer Comment  Diet-HS Snack? Nothing   Calorie Level Medium 1600-2000   Fluid consistency: Thin   Room service appropriate? Yes      09/08/24 0204           DVT prophylaxis:  dabigatran  (PRADAXA ) capsule 150 mg  Level of care: Telemetry   Code Status: Limited: Do not attempt resuscitation (DNR) -DNR-LIMITED -Do Not Intubate/DNI   Subjective: Patient is seen and examined today morning.  He is sitting on edge of bed, eating fair. Denies any complaints.  Physical Exam: Vitals:   09/12/24 1137 09/12/24 2006 09/13/24 0443 09/13/24 0813  BP: 115/68 135/84 (!) 156/88   Pulse: 82 (!) 55 78   Resp:  18 18   Temp:  97.7 F (36.5 C)    TempSrc:  Oral    SpO2: 100% 100% 100% 96%    General - Elderly obese Caucasian male, no acute distress. HEENT -  PERRLA, EOMI, atraumatic head, non tender sinuses. Lung - Clear, basal rales, rhonchi, no wheezes. Heart - S1, S2 heard, no murmurs, rubs, trace pedal edema. Abdomen - Soft, non tender, obese, bowel sounds heard Neuro - Alert, awake and oriented, non focal exam. Skin - Warm and dry.  Data Reviewed:      Latest Ref Rng & Units 09/13/2024     4:18 AM 09/12/2024    5:03 AM 09/11/2024    4:57 AM  CBC  WBC 4.0 - 10.5 K/uL 13.3  17.9  14.4   Hemoglobin 13.0 - 17.0 g/dL 87.6  87.6  87.6   Hematocrit 39.0 - 52.0 % 39.0  39.6  38.3   Platelets 150 - 400 K/uL 304  340  339       Latest Ref Rng & Units 09/13/2024    4:18 AM 09/12/2024    5:03 AM 09/11/2024    4:57 AM  BMP  Glucose 70 - 99 mg/dL 881  859  865   BUN 8 - 23 mg/dL 34  40  42   Creatinine 0.61 - 1.24 mg/dL 8.47  8.27  8.34   Sodium 135 - 145 mmol/L 133  132  136   Potassium 3.5 - 5.1 mmol/L 4.3  5.2  3.8   Chloride 98 - 111 mmol/L 96  95  95   CO2 22 - 32 mmol/L 28  30  33   Calcium  8.9 - 10.3 mg/dL 9.1  9.1  9.2    No results found.    Family Communication: Discussed with patient, daughter over phone. He understand and agree. All questions answered.  Disposition: Status is: Inpatient Remains inpatient appropriate because: IV antibiotics, sepsis, monitor kidney function, electrolytes  Planned Discharge Destination: Rehab     Time spent: 44 minutes  Author: Concepcion Riser, MD 09/13/2024 2:06 PM Secure chat 7am to 7pm For on call review [email or web address]    "

## 2024-09-13 NOTE — Progress Notes (Signed)
" °   09/13/24 0136  BiPAP/CPAP/SIPAP  BiPAP/CPAP/SIPAP Pt Type Adult  BiPAP/CPAP/SIPAP Resmed  Mask Type Full face mask  Dentures removed? Not applicable  Mask Size Large  Respiratory Rate 18 breaths/min  Flow Rate 2 lpm  Patient Home Machine Yes  Safety Check Completed by RT for Home Unit Yes, no issues noted  Patient Home Mask Yes  Patient Home Tubing Yes  Auto Titrate Yes  Minimum cmH2O 5 cmH2O  Maximum cmH2O 20 cmH2O  Device Plugged into [employer] Power Outlet Yes    "

## 2024-09-13 NOTE — Progress Notes (Signed)
 Occupational Therapy Treatment Patient Details Name: Justin [name] MRN: [id number] DOB: March 26, 1934 Today's Date: 09/13/2024   History of present illness Pt is an 89yo male presenting from his provider's office found to be orthostatic with standing BP of 87/56 symptomatic; with hypokalemia, hyponatremia, with overlying ESBL e Coli UTI; CT abdomen negative for acute findings. PMH: BPH, CAD, CHF, GERD, gout, HTN, HLD, hypothyroidism, hx of MI, PAF, hx of prostate cancer, s/p CABGx4 and TAVR, b/l hammer toe surgery, b/l TKA with R revision, hx of L PE s/p thoracentesis, s/p AICD placement, BM, OSA on CPAP   OT comments  Patient seen for skilled OT session. Progressing and open to all therapy presented. See below for current status and VSR post ADL and hallway amb. Cues and education for energy conservation, breathing integration and pacing. Patient requires continued Acute care hospital level OT services to progress safety and functional performance and allow for discharge. Anticipate no follow up OT needs post acute hospital stay as patient approaching baseline for ADL's.        If plan is discharge home, recommend the following:  A little help with walking and/or transfers;A lot of help with bathing/dressing/bathroom;Assistance with cooking/housework;Direct supervision/assist for medications management;Direct supervision/assist for financial management;Assist for transportation;Help with stairs or ramp for entrance   Equipment Recommendations  None recommended by OT       Precautions / Restrictions Precautions Precautions: Fall Precaution/Restrictions Comments: watch for orthostatic hypotension Restrictions Weight Bearing Restrictions Per Provider Order: No       Mobility Bed Mobility Overal bed mobility:  (was up in recliner at start of session)                  Transfers Overall transfer level: Needs assistance Equipment used: Rolling walker (2 wheels) Transfers: Sit  to/from Stand, Bed to chair/wheelchair/BSC Sit to Stand: Supervision     Step pivot transfers: Supervision     General transfer comment: use of commode with min cues for RW navigation and hand placement     Balance Overall balance assessment: Needs assistance Sitting-balance support: Feet supported, No upper extremity supported Sitting balance-Leahy Scale: Good     Standing balance support: No upper extremity supported Standing balance-Leahy Scale: Fair                             ADL either performed or assessed with clinical judgement   ADL Overall ADL's : Needs assistance/impaired Eating/Feeding: Set up;Sitting Eating/Feeding Details (indicate cue type and reason): assist to open containers Grooming: Wash/dry hands;Wash/dry face;Oral care;Set up;Sitting Grooming Details (indicate cue type and reason): assist to manage opening items Upper Body Bathing: Contact guard assist;Sitting   Lower Body Bathing: Minimal assistance;Moderate assistance;Sit to/from stand   Upper Body Dressing : Contact guard assist;Sitting   Lower Body Dressing: Moderate assistance;Maximal assistance;Sit to/from stand Lower Body Dressing Details (indicate cue type and reason): decreased reach to feet Toilet Transfer: Supervision/safety;Rolling walker (2 wheels)   Toileting- Clothing Manipulation and Hygiene: Contact guard assist;Sitting/lateral lean       Functional mobility during ADLs: Supervision/safety;Rolling walker (2 wheels) General ADL Comments: educated on ECT and 5 P's with breathing integration    Extremity/Trunk Assessment Upper Extremity Assessment Upper Extremity Assessment: Overall WFL for tasks assessed   Lower Extremity Assessment Lower Extremity Assessment: Defer to PT evaluation        Vision   Additional Comments: loss of central vision due to mac degen and cannot see  detail         Communication Communication Communication: No apparent difficulties    Cognition Arousal: Alert Behavior During Therapy: WFL for tasks assessed/performed Cognition: No apparent impairments                               Following commands: Intact        Cueing   Cueing Techniques: Verbal cues  Exercises      Shoulder Instructions       General Comments LE's elevated pre and post session, HR 88 and BP 156/76, 100% SpO2 on RA and RR 16; post amb in room and 75 ft in hallway BP 152/72, HR 100, SpO2 98% on RA and RR 24 with cues for pacing and breathing down to 18 with 2 minute recovery rest    Pertinent Vitals/ Pain       Pain Assessment Pain Assessment: No/denies pain   Frequency  Min 2X/week        Progress Toward Goals  OT Goals(current goals can now be found in the care plan section)  Progress towards OT goals: Progressing toward goals  Acute Rehab OT Goals OT Goal Formulation: With patient Time For Goal Achievement: 09/25/24 Potential to Achieve Goals: Good ADL Goals Pt Will Perform Grooming: with supervision;standing Pt Will Transfer to Toilet: with supervision;ambulating Pt Will Perform Toileting - Clothing Manipulation and hygiene: with supervision;sit to/from stand  Plan         AM-PAC OT 6 Clicks Daily Activity     Outcome Measure   Help from another person eating meals?: A Little Help from another person taking care of personal grooming?: A Little Help from another person toileting, which includes using toliet, bedpan, or urinal?: A Little Help from another person bathing (including washing, rinsing, drying)?: A Lot Help from another person to put on and taking off regular upper body clothing?: A Little Help from another person to put on and taking off regular lower body clothing?: A Lot 6 Click Score: 16    End of Session Equipment Utilized During Treatment: Gait belt;Rolling walker (2 wheels)  OT Visit Diagnosis: Unsteadiness on feet (R26.81);Other abnormalities of gait and mobility (R26.89);Low  vision, both eyes (H54.2);Dizziness and giddiness (R42)   Activity Tolerance Patient tolerated treatment well   Patient Left in chair;with call bell/phone within reach;with chair alarm set   Nurse Communication Mobility status        Time: 8694-8664 OT Time Calculation (min): 30 min  Charges: OT General Charges $OT Visit: 1 Visit OT Treatments $Self Care/Home Management : 8-22 mins $Therapeutic Activity: 8-22 mins  Justin [name] OT/L Acute Rehabilitation Department  [phone]  09/13/2024, 3:53 PM

## 2024-09-13 NOTE — Progress Notes (Signed)
 Mobility Specialist Progress Note:   09/13/24 1231  Mobility  Activity Pivoted/transferred from bed to chair  Level of Assistance Minimal assist, patient does 75% or more  Assistive Device Front wheel walker  Distance Ambulated (ft) 3 ft  Activity Response Tolerated well  Mobility Referral Yes  Mobility visit 1 Mobility  Mobility Specialist Start Time (ACUTE ONLY) 1136  Mobility Specialist Stop Time (ACUTE ONLY) 1147  Mobility Specialist Time Calculation (min) (ACUTE ONLY) 11 min   Pt was received at the edge of bed and agreed to mobility. Min A sit to stand. Returned to recliner with all needs met. Call bell in reach.  [employer] - Mobility Specialist

## 2024-09-14 ENCOUNTER — Encounter: Payer: Self-pay | Admitting: Internal Medicine

## 2024-09-14 ENCOUNTER — Telehealth (HOSPITAL_COMMUNITY): Payer: Self-pay

## 2024-09-14 ENCOUNTER — Telehealth: Payer: Self-pay

## 2024-09-14 DIAGNOSIS — Z794 Long term (current) use of insulin: Secondary | ICD-10-CM | POA: Diagnosis not present

## 2024-09-14 DIAGNOSIS — I5022 Chronic systolic (congestive) heart failure: Secondary | ICD-10-CM | POA: Diagnosis not present

## 2024-09-14 DIAGNOSIS — Z95810 Presence of automatic (implantable) cardiac defibrillator: Secondary | ICD-10-CM | POA: Diagnosis not present

## 2024-09-14 DIAGNOSIS — E1169 Type 2 diabetes mellitus with other specified complication: Secondary | ICD-10-CM | POA: Diagnosis not present

## 2024-09-14 DIAGNOSIS — Z1612 Extended spectrum beta lactamase (ESBL) resistance: Secondary | ICD-10-CM | POA: Diagnosis not present

## 2024-09-14 DIAGNOSIS — D508 Other iron deficiency anemias: Secondary | ICD-10-CM | POA: Diagnosis not present

## 2024-09-14 DIAGNOSIS — I951 Orthostatic hypotension: Secondary | ICD-10-CM | POA: Diagnosis not present

## 2024-09-14 DIAGNOSIS — N1831 Chronic kidney disease, stage 3a: Secondary | ICD-10-CM | POA: Diagnosis not present

## 2024-09-14 DIAGNOSIS — A498 Other bacterial infections of unspecified site: Secondary | ICD-10-CM

## 2024-09-14 DIAGNOSIS — E039 Hypothyroidism, unspecified: Secondary | ICD-10-CM | POA: Diagnosis not present

## 2024-09-14 DIAGNOSIS — D509 Iron deficiency anemia, unspecified: Secondary | ICD-10-CM | POA: Insufficient documentation

## 2024-09-14 DIAGNOSIS — N179 Acute kidney failure, unspecified: Secondary | ICD-10-CM | POA: Diagnosis not present

## 2024-09-14 DIAGNOSIS — Z951 Presence of aortocoronary bypass graft: Secondary | ICD-10-CM | POA: Diagnosis not present

## 2024-09-14 LAB — BASIC METABOLIC PANEL WITH GFR
Anion gap: 10 (ref 5–15)
BUN: 28 mg/dL — ABNORMAL HIGH (ref 8–23)
CO2: 26 mmol/L (ref 22–32)
Calcium: 8.9 mg/dL (ref 8.9–10.3)
Chloride: 97 mmol/L — ABNORMAL LOW (ref 98–111)
Creatinine, Ser: 1.5 mg/dL — ABNORMAL HIGH (ref 0.61–1.24)
GFR, Estimated: 45 mL/min — ABNORMAL LOW
Glucose, Bld: 120 mg/dL — ABNORMAL HIGH (ref 70–99)
Potassium: 4.2 mmol/L (ref 3.5–5.1)
Sodium: 133 mmol/L — ABNORMAL LOW (ref 135–145)

## 2024-09-14 LAB — CBC
HCT: 38.2 % — ABNORMAL LOW (ref 39.0–52.0)
Hemoglobin: 12.4 g/dL — ABNORMAL LOW (ref 13.0–17.0)
MCH: 27 pg (ref 26.0–34.0)
MCHC: 32.5 g/dL (ref 30.0–36.0)
MCV: 83.2 fL (ref 80.0–100.0)
Platelets: 320 K/uL (ref 150–400)
RBC: 4.59 MIL/uL (ref 4.22–5.81)
RDW: 17.2 % — ABNORMAL HIGH (ref 11.5–15.5)
WBC: 13.8 K/uL — ABNORMAL HIGH (ref 4.0–10.5)
nRBC: 0 % (ref 0.0–0.2)

## 2024-09-14 MED ORDER — SODIUM CHLORIDE 0.9 % IV SOLN
1.0000 g | Freq: Two times a day (BID) | INTRAVENOUS | Status: DC
  Filled 2024-09-14: qty 20

## 2024-09-14 MED ORDER — ERTAPENEM SODIUM 1 G IJ SOLR: 1.0000 g | INTRAMUSCULAR | 0 refills | Status: AC

## 2024-09-14 MED ORDER — FERROUS SULFATE 325 (65 FE) MG PO TABS: 325.0000 mg | ORAL_TABLET | Freq: Every day | ORAL | 1 refills | Status: AC

## 2024-09-14 NOTE — Progress Notes (Signed)
" °   09/14/24 0009  BiPAP/CPAP/SIPAP  $ Non-Invasive Home Ventilator  Subsequent  BiPAP/CPAP/SIPAP Pt Type Adult  BiPAP/CPAP/SIPAP Resmed  Mask Type Full face mask  Dentures removed? Not applicable  Mask Size Large  Respiratory Rate 18 breaths/min  Flow Rate 2 lpm  Patient Home Machine Yes  Safety Check Completed by RT for Home Unit Yes, no issues noted  Patient Home Mask Yes  Patient Home Tubing Yes  Auto Titrate Yes  Minimum cmH2O 5 cmH2O  Maximum cmH2O 20 cmH2O  Device Plugged into RED Power Outlet Yes    "

## 2024-09-14 NOTE — Telephone Encounter (Signed)
 Called to confirm/remind patient of their appointment [facility] on 09/15/[facility].   Appointment:   [] Confirmed  [x] Left mess   [] No answer/No voice mail  [] VM Full/unable to leave message  [] Phone not in service  And to bring in all medications and/or complete list.

## 2024-09-14 NOTE — TOC Transition Note (Signed)
 Transition of Care [facility]) - Discharge Note   Patient Details  Name: DENVER BENTSON MRN: [id number] Date of Birth: July 08, 1934  Transition of Care [facility]) CM/SW Contact:  NORMAN ASPEN, LCSW Phone Number: 09/14/2024, 4:38 PM   Clinical Narrative:     Pt is medically cleared for dc today and PT has recommended return to ALF as patient has made good progress to return to baseline with mobilitiy.  Plan confirmed with pt/daughter and [facility].  Daughter to provide dc transportation.   HH therapy orders sent to facility via hub.  No further IP CM needs.  Final next level of care: Assisted Living Barriers to Discharge: Barriers Resolved   Patient Goals and CMS Choice Patient states their goals for this hospitalization and ongoing recovery are:: [facility] [place] CMS Medicare.gov Compare Post Acute Care list provided to:: Patient Represenative (must comment) [place] POA) Choice offered to / list presented to : [facility] POA / Guardian [place] ownership interest in [facility].provided to:: [facility] POA / Guardian    Discharge Placement                       Discharge Plan and Services Additional resources added to the After Visit Summary for   In-house Referral: NA Discharge Planning Services: CM Consult            DME Arranged: N/A DME Agency: NA       HH Arranged: NA HH Agency: NA        Social Drivers of Health (SDOH) Interventions SDOH Screenings   Food Insecurity: No Food Insecurity (09/09/2024)  Housing: Unknown (09/09/2024)  Transportation Needs: Patient Declined (09/09/2024)  Utilities: Not At Risk (09/09/2024)  Alcohol Screen: Low Risk (03/31/2023)  Depression (PHQ2-9): Low Risk (04/05/2024)  Financial Resource Strain: Low Risk (03/31/2023)  Physical Activity: Sufficiently Active (03/31/2023)  Social Connections: Patient Declined (09/09/2024)  Stress: No Stress Concern Present (03/31/2023)  Tobacco Use: Medium Risk (09/06/2024)  Health Literacy: Adequate  Health Literacy (03/31/2023)     Readmission Risk Interventions    09/12/2024   10:13 AM 08/05/2024   11:56 AM 11/19/2023    1:00 PM  Readmission Risk Prevention Plan  Transportation Screening Complete Complete Complete  PCP or Specialist Appt within 3-5 Days   Complete  HRI or Home Care Consult   Complete  Social Work Consult for Recovery Care Planning/Counseling   Complete  Palliative Care Screening   Not Applicable  Medication Review Oceanographer) Complete Complete Complete  PCP or Specialist appointment within 3-5 days of discharge Complete Complete   HRI or Home Care Consult Complete Complete   SW Recovery Care/Counseling Consult Complete Complete   Palliative Care Screening Not Applicable Not Applicable   Skilled Nursing Facility Complete Not Applicable

## 2024-09-14 NOTE — Telephone Encounter (Signed)
 Returned call to daughter Franklin Baumbach.  Pt is being discharged today from the hospital.  He admitted for infection and dehydration. Hospital physician has stopped Metolazone  and Torsemide  at discharge.  He has an appt with HF clinic tomorrow, 09/15/2024 and will discuss fluid management.    09/13/2024 Optivol Thoracic impedance suggesting possible dryness starting 08/30/2024 and is returning to baseline on 09/13/2024.

## 2024-09-14 NOTE — Plan of Care (Signed)

## 2024-09-14 NOTE — NC FL2 (Signed)
 " [place]  MEDICAID FL2 LEVEL OF CARE FORM     IDENTIFICATION  Patient Name: Justin [name] Birthdate: 10-Mar-1934 Sex: male Admission Date (Current Location): 09/07/2024  [facility] and [place] Number:  Producer, Television/film/video and Address:  [facility],  [address] [place], [place] [address]      Provider Number: [id number]  Attending Physician Name and Address:  Darci Pore, MD  Relative Name and Phone Number:  Decari, Duggar  Daughter, Emergency Contact  [phone]    Current Level of Care: Hospital Recommended Level of Care: Assisted Living Facility Prior Approval Number:    Date Approved/Denied:   PASRR Number: [id number]  Discharge Plan: Other (Comment) (Assisted Living)    Current Diagnoses: Patient Active Problem List   Diagnosis Date Noted   Severe sepsis (HCC) 09/09/2024   AKI (acute kidney injury) 09/08/2024   Hypokalemia 09/07/2024   Orthostatic hypotension 08/03/2024   Acute renal failure superimposed on stage 3a chronic kidney disease (HCC) 08/03/2024   Mild cognitive impairment 08/02/2024   Dry skin dermatitis 07/30/2024   COPD (chronic obstructive pulmonary disease) (HCC) 05/27/2024   Hyponatremia 02/16/2024   E. coli UTI 12/19/2023   Recurrent depression 12/19/2023   Candidiasis 12/15/2023   Pleural effusion 11/20/2023   Asymptomatic bacteriuria 11/20/2023   Bronchitis 11/06/2023   Type 2 diabetes mellitus (HCC) 10/23/2023   Leukocytosis 10/16/2023   Right ventricular dysfunction 07/05/2023   Chronic kidney disease, stage 3a (HCC) 07/05/2023   Persistent atrial fibrillation (HCC) 07/05/2023   Long term (current) use of anticoagulants 07/05/2023   NSVT (nonsustained ventricular tachycardia) (HCC) 05/23/2022   Sinus node dysfunction (HCC) 05/23/2022   Frequent PVCs 05/23/2022   Biventricular implantable cardioverter-defibrillator (ICD) in situ 03/12/2022   Atypical atrial flutter (HCC) 01/30/2022   Secondary hypercoagulable  state 04/19/2021   Raynaud's phenomenon without gangrene 07/11/2020   Vertigo 07/11/2020   Loosening of knee joint prosthesis 05/18/2019   Cough 04/30/2019   Gastroesophageal reflux disease 04/30/2019   S/P ICD (internal cardiac defibrillator) procedure 05/03/2017   NICM (nonischemic cardiomyopathy) (HCC) 05/02/2017   History of transcatheter aortic valve replacement (TAVR) 03/04/2017   Severe aortic valve stenosis 03/04/2017   Hypokalemia due to loss of potassium with diuresis 01/10/2017   Aortic stenosis 01/10/2017   Chronic systolic CHF (congestive heart failure) (HCC) 01/10/2017   Chronic congestive heart failure (HCC) 01/07/2017   Hypothyroidism 12/23/2016   Prostate cancer (HCC)    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   Edema of both legs 09/01/2014   Asthma, intermittent 09/01/2014   Epiphora due to insufficient drainage 07/02/2011   Rosacea blepharoconjunctivitis 07/02/2011   Coronary artery disease involving native coronary artery of native heart without angina pectoris    GOUT 09/12/2009   CAD, AUTOLOGOUS BYPASS GRAFT 05/10/2009   MURMUR 05/08/2009   BRUIT 05/08/2009   MUSCLE CRAMPS 12/12/2008   KNEE SPRAIN 09/21/2008   Osteoarthritis 07/07/2008   BPH with urinary obstruction 02/01/2008   LOW BACK PAIN SYNDROME 02/01/2008   TESTOSTERONE DEFICIENCY 07/03/2007   Hyperlipidemia 07/03/2007   Essential hypertension 07/03/2007   MYOCARDIAL INFARCTION, HX OF 07/03/2007   Allergic rhinitis 07/03/2007   History of colonic polyps 07/03/2007   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    Orientation RESPIRATION BLADDER Height & Weight     Self, Time, Situation, Place  Normal Continent Weight:   Height:     BEHAVIORAL SYMPTOMS/MOOD NEUROLOGICAL BOWEL NUTRITION STATUS      Continent Diet  AMBULATORY STATUS COMMUNICATION OF NEEDS Skin   Independent  Verbally Normal                       Personal Care Assistance Level of Assistance  Bathing, Feeding, Dressing Bathing Assistance:  Independent Feeding assistance: Independent Dressing Assistance: Independent     Functional Limitations Info  Sight, Hearing, Speech Sight Info: Adequate Hearing Info: Adequate Speech Info: Adequate    SPECIAL CARE FACTORS FREQUENCY  PT (By licensed PT)     PT Frequency: 2x weekly              Contractures Contractures Info: Not present    Additional Factors Info  Code Status, Allergies, Isolation Precautions Code Status Info: DNR-Limited Allergies Info: Peanut (Diagnostic), Peanut-containing Drug Products, Sulfa Antibiotics, Sulfonamide Derivatives, Eliquis  (Apixaban ), Norvasc  (Amlodipine ), Xarelto  (Rivaroxaban ), Zestril  (Lisinopril ), Lactose     Isolation Precautions Info: Contact - ESBL(Extended spectrum beta lactamase  Specimen information: Urine)     Current Medications (09/14/2024):  This is the current hospital active medication list Current Facility-Administered Medications  Medication Dose Route Frequency Provider Last Rate Last Admin   acetaminophen  (TYLENOL ) tablet 650 mg  650 mg Oral Q6H PRN Arthea Child, MD       Or   acetaminophen  (TYLENOL ) suppository 650 mg  650 mg Rectal Q6H PRN Arthea Child, MD       albuterol  (PROVENTIL ) (2.5 MG/3ML) 0.083% nebulizer solution 2.5 mg  2.5 mg Nebulization Q2H PRN Arthea Child, MD   2.5 mg at 09/09/24 1429   allopurinol  (ZYLOPRIM ) tablet 100 mg  100 mg Oral Daily Claiborne, Claudia, MD   100 mg at 09/14/24 0922   alum & mag hydroxide-simeth (MAALOX/MYLANTA) 200-200-20 MG/5ML suspension 30 mL  30 mL Oral Q4H PRN Darci Pore, MD   30 mL at 09/11/24 0518   amiodarone  (PACERONE ) tablet 200 mg  200 mg Oral Daily Claiborne, Claudia, MD   200 mg at 09/14/24 9077   aspirin  chewable tablet 81 mg  81 mg Oral Daily Claiborne, Claudia, MD   81 mg at 09/14/24 9076   atorvastatin  (LIPITOR) tablet 20 mg  20 mg Oral Daily Claiborne, Claudia, MD   20 mg at 09/14/24 9077   benzonatate  (TESSALON ) capsule 100 mg   100 mg Oral TID Claiborne, Claudia, MD   100 mg at 09/14/24 9076   bisacodyl  (DULCOLAX) EC tablet 5 mg  5 mg Oral Daily PRN Claiborne, Claudia, MD   5 mg at 09/10/24 1026   calcium  carbonate (TUMS - dosed in mg elemental calcium ) chewable tablet 200 mg of elemental calcium   1 tablet Oral TID WC Sreeram, Narendranath, MD   200 mg of elemental calcium  at 09/14/24 1147   dabigatran  (PRADAXA ) capsule 150 mg  150 mg Oral Q12H Claiborne, Claudia, MD   150 mg at 09/14/24 9077   docusate sodium  (COLACE) capsule 100 mg  100 mg Oral BID Claiborne, Claudia, MD   100 mg at 09/14/24 9076   ferrous sulfate  tablet 325 mg  325 mg Oral Q breakfast Sreeram, Narendranath, MD   325 mg at 09/14/24 9076   guaiFENesin  (MUCINEX ) 12 hr tablet 600 mg  600 mg Oral BID Claiborne, Claudia, MD   600 mg at 09/14/24 9076   levothyroxine  (SYNTHROID ) tablet 175 mcg  175 mcg Oral Q0600 Claiborne, Claudia, MD   175 mcg at 09/14/24 9379   meropenem  (MERREM ) 1 g in sodium chloride  0.9 % 100 mL IVPB  1 g Intravenous Q12H Darci Pore, MD       ondansetron  (ZOFRAN ) tablet 4  mg  4 mg Oral Q6H PRN Arthea Child, MD       Or   ondansetron  (ZOFRAN ) injection 4 mg  4 mg Intravenous Q6H PRN Arthea Child, MD       oxyCODONE  (Oxy IR/ROXICODONE ) immediate release tablet 5 mg  5 mg Oral Q8H PRN Claiborne, Claudia, MD       pantoprazole  (PROTONIX ) EC tablet 40 mg  40 mg Oral BID Sreeram, Narendranath, MD   40 mg at 09/14/24 9077   polyethylene glycol (MIRALAX  / GLYCOLAX ) packet 17 g  17 g Oral Daily Sreeram, Narendranath, MD   17 g at 09/14/24 9077   sodium chloride  flush (NS) 0.9 % injection 3 mL  3 mL Intravenous Q12H Arthea Child, MD   3 mL at 09/14/24 0930   tamsulosin  (FLOMAX ) capsule 0.4 mg  0.4 mg Oral QHS Arthea Child, MD   0.4 mg at 09/13/24 2232   umeclidinium bromide  (INCRUSE ELLIPTA ) 62.5 MCG/ACT 1 puff  1 puff Inhalation Daily Zammit, Joseph, MD   1 puff at 09/14/24 9256   venlafaxine  XR (EFFEXOR -XR) 24  hr capsule 37.5 mg  37.5 mg Oral Daily Claiborne, Claudia, MD   37.5 mg at 09/14/24 9077     Discharge Medications: Please see discharge summary for a list of discharge medications.  Relevant Imaging Results:  Relevant Lab Results:   Additional Information SSN  [id number]  NORMAN ASPEN, LCSW     "

## 2024-09-14 NOTE — Progress Notes (Signed)
 Physical Therapy Treatment Patient Details Name: Justin [name] MRN: [id number] DOB: 03/10/1934 Today's Date: 09/14/2024   History of Present Illness Pt is an 89yo male presenting from his provider's office found to be orthostatic with standing BP of 87/56 symptomatic; with hypokalemia, hyponatremia, with overlying ESBL e Coli UTI; CT abdomen negative for acute findings. PMH: BPH, CAD, CHF, GERD, gout, HTN, HLD, hypothyroidism, hx of MI, PAF, hx of prostate cancer, s/p CABGx4 and TAVR, b/l hammer toe surgery, b/l TKA with R revision, hx of L PE s/p thoracentesis, s/p AICD placement, BM, OSA on CPAP    PT Comments   Pt admitted with above diagnosis.  Pt currently with functional limitations due to the deficits listed below (see PT Problem List). Pt seated in recliner when PT arrived. Pt agreeable to therapy session and reported he is ready to go home. Pt states he has some assist for lower body dressing at ALF, medication management and meal prep. Pt was mod I (set up for placement of RW) for sit to stand  from recliner, close S and progressed to mod I for gait tasks in hallway with RW, min cues and no overt LOB, no reports of pain, dizziness nor SOB with functional mobility tasks. Pt elected to return to recliner and all needs in place. Pt will benefit from acute skilled PT to increase their independence and safety with mobility to allow discharge.      If plan is discharge home, recommend the following: A little help with walking and/or transfers;A little help with bathing/dressing/bathroom;Assistance with cooking/housework;Assist for transportation;Help with stairs or ramp for entrance   Can travel by private vehicle        Equipment Recommendations  None recommended by PT    Recommendations for Other Services       Precautions / Restrictions Precautions Precautions: Fall Precaution/Restrictions Comments: watch for orthostatic hypotension Restrictions Weight Bearing Restrictions Per  Provider Order: No     Mobility  Bed Mobility               General bed mobility comments: received and returned to chair    Transfers Overall transfer level: Needs assistance Equipment used: Rolling walker (2 wheels) Transfers: Sit to/from Stand, Bed to chair/wheelchair/BSC Sit to Stand: Modified independent (Device/Increase time)           General transfer comment: no cues, set up for placement of RW, pt able to manage leg rest on recliner no reports of dizziness with positional changes    Ambulation/Gait Ambulation/Gait assistance: Modified independent (Device/Increase time), Supervision Gait Distance (Feet): 250 Feet Assistive device: Rolling walker (2 wheels) Gait Pattern/deviations: Trunk flexed, Wide base of support, Decreased stride length Gait velocity: decreased     General Gait Details: min cues for safety, posture and RW management no overt LOB, no reports of SOB nor dizziness   Stairs             Wheelchair Mobility     Tilt Bed    Modified Rankin (Stroke Patients Only)       Balance Overall balance assessment: Needs assistance Sitting-balance support: Feet supported, No upper extremity supported Sitting balance-Leahy Scale: Good     Standing balance support: Bilateral upper extremity supported, During functional activity, Reliant on assistive device for balance Standing balance-Leahy Scale: Fair Standing balance comment: static standing no UE support  Communication Communication Communication: No apparent difficulties  Cognition Arousal: Alert Behavior During Therapy: WFL for tasks assessed/performed   PT - Cognitive impairments: No apparent impairments, No family/caregiver present to determine baseline                         Following commands: Intact      Cueing Cueing Techniques: Verbal cues  Exercises      General Comments        Pertinent Vitals/Pain Pain  Assessment Pain Assessment: No/denies pain    Home Living                          Prior Function            PT Goals (current goals can now be found in the care plan section) Acute Rehab PT Goals Patient Stated Goal: To get back to [facility] PT Goal Formulation: With patient Time For Goal Achievement: 09/25/24 Potential to Achieve Goals: Good Progress towards PT goals: Progressing toward goals    Frequency    Min 1X/week      PT Plan      Co-evaluation              AM-PAC PT 6 Clicks Mobility   Outcome Measure  Help needed turning from your back to your side while in a flat bed without using bedrails?: None Help needed moving from lying on your back to sitting on the side of a flat bed without using bedrails?: None Help needed moving to and from a bed to a chair (including a wheelchair)?: None Help needed standing up from a chair using your arms (e.g., wheelchair or bedside chair)?: None Help needed to walk in hospital room?: A Little Help needed climbing 3-5 steps with a railing? : Total 6 Click Score: 20    End of Session Equipment Utilized During Treatment: Gait belt Activity Tolerance: Patient tolerated treatment well;No increased pain Patient left: in chair;with call bell/phone within reach Nurse Communication: Mobility status PT Visit Diagnosis: Unsteadiness on feet (R26.81);Difficulty in walking, not elsewhere classified (R26.2)     Time: 8674-8664 PT Time Calculation (min) (ACUTE ONLY): 10 min  Charges:    $Gait Training: 8-22 mins PT General Charges $$ ACUTE PT VISIT: 1 Visit                     [place], PT Acute Rehab    [place] VEAR Drone 09/14/2024, 1:40 PM

## 2024-09-14 NOTE — Discharge Summary (Signed)
 " Physician Discharge Summary   Patient: Justin [name] MRN: [id number] DOB: 1934-01-14  Admit date:     09/07/2024  Discharge date: 09/14/24  Discharge Physician: Concepcion Riser   PCP: Mast, Man X, NP   Recommendations at discharge:    PCP follow up in 1 week. Cardiology follow up as scheduled.  Discharge Diagnoses: Principal Problem:   Acute renal failure superimposed on stage 3a chronic kidney disease (HCC) Active Problems:   Hypothyroidism   Chronic systolic CHF (congestive heart failure) (HCC)   Essential hypertension   Coronary artery disease involving native coronary artery of native heart without angina pectoris   Aortic stenosis   S/P ICD (internal cardiac defibrillator) procedure   Type 2 diabetes mellitus (HCC)   E. coli UTI   Hypokalemia   AKI (acute kidney injury)   Severe sepsis (HCC)  Resolved Problems:   * No resolved hospital problems. *  Hospital Course: Justin [name] is a 89 y.o. male with medical history significant for AICD placement, status post TAVR, history of atrial fibrillation on Pradaxa , OSA on CPAP, history of left pleural effusion status post thoracentesis, gout, CKD, baseline creatinine 1.65, DMT2, macular degeneration, hypertension, coronary artery disease, and hypothyroidism resident of assisted living seen by the provider yesterday because of an acute episode where he had dizziness and could not make it to the bathroom found to be orthostatic, diuretic held, blood work was obtained showed white blood cell count found to be 26k presented to ED for further evaluation.    In the emergency department patient white count 26K, lactic acid 3.2, potassium 3, creatinine 2.7, BNP 2178.  COVID flu RSV negative, patient was started on IV Rocephin  and azithromycin  admitted to hospitalist service for further management evaluation.   Assessment and Plan: Severe sepsis- ESBL E coli UTI.  Patient presented with tachycardia, tachypnea, elevated white  count, lactic acid, AKI. Urine cultures grew ESBL E. coli. Finished Meropenem  for total 5 days.  CT abdomen/ pelvis show no pyelonephritis. WBC improving and lactic acid trended down.  WBC today 13.8. Invanz  for 2 more days prescribed. Advised PCP follow up in 1 week.  Acute on CKD stage 3a- in the setting of dehydration, sepsis, hypotension- Hold diuretics, blood pressure medications. He is persistently orthostatic. Eating fair, no more IV fluids.  Kidney function improved and back to baseline around 1.5 to 2.  Continue to monitor renal function as outpatient.   Hypokalemia: K higher side noted. As he is not getting diuretics, stopped kcl supplements.   Hyponatremia- Sodium 133. Chronic in the setting of CHF. Continue to trend sodium as outpatient.   Type 2 diabetes mellitus: A1c 6.2. diet controlled. He is not on meds.   CHF with mildly reduced EF: Caution with fluid overload.  Currently beta blocker, aldactone , torsemide  on hold due to hypotension. Outpatient cardiology follow up.   A-fib: HR 50-90. Continue amiodarone , Pradaxa .  Hold beta blocker due to low BP. Discussed with Cardiology regarding further recommendations.  Iron deficiency- Oral iron replacement prescribed.   OSA continue CPAP at night.   GERD - Continue PPI to bid.   Obesity class I: BMI 34.9 Encourage diet, exercise and weight reduction.      Consultants: none Procedures performed: none  Disposition: Assisted living Diet recommendation:  Discharge Diet Orders (From admission, onward)     Start     Ordered   09/14/24 0000  Diet - low sodium heart healthy        09/14/24 1509  Cardiac diet DISCHARGE MEDICATION: Allergies as of 09/14/2024       Reactions   Peanut (diagnostic) Anaphylaxis   Peanut-containing Drug Products Anaphylaxis   Sulfa Antibiotics Anaphylaxis   Sulfonamide Derivatives Anaphylaxis   Eliquis  [apixaban ] Other (See Comments)   Back/hip pain   Norvasc   [amlodipine ] Swelling   *lower extremity swelling   Xarelto  [rivaroxaban ] Other (See Comments)   Back/hip pain   Zestril  [lisinopril ] Cough   Lactose Other (See Comments)   Unknown reaction        Medication List     STOP taking these medications    metolazone  5 MG tablet Commonly known as: ZAROXOLYN    metoprolol  succinate 100 MG 24 hr tablet Commonly known as: TOPROL -XL   potassium chloride  SA 20 MEQ tablet Commonly known as: KLOR-CON  M   predniSONE  10 MG tablet Commonly known as: DELTASONE    predniSONE  20 MG tablet Commonly known as: DELTASONE    spironolactone  25 MG tablet Commonly known as: ALDACTONE    torsemide  100 MG tablet Commonly known as: DEMADEX        TAKE these medications    albuterol  108 (90 Base) MCG/ACT inhaler Commonly known as: VENTOLIN  HFA Inhale 2 puffs into the lungs every 4 (four) hours as needed for wheezing or shortness of breath.   allopurinol  100 MG tablet Commonly known as: ZYLOPRIM  Take 1 tablet (100 mg total) by mouth daily.   amiodarone  200 MG tablet Commonly known as: PACERONE  Take 1 tablet (200 mg total) by mouth daily.   aspirin  81 MG chewable tablet Chew 81 mg by mouth daily.   atorvastatin  20 MG tablet Commonly known as: LIPITOR Take 20 mg by mouth daily.   benzonatate  100 MG capsule Commonly known as: TESSALON  Take 100 mg by mouth 3 (three) times daily.   cetirizine 10 MG tablet Commonly known as: ZYRTEC Take 5 mg by mouth daily.   dabigatran  150 MG Caps capsule Commonly known as: Pradaxa  TAKE (1) CAPSULE TWICE DAILY.   diclofenac  Sodium 1 % Gel Commonly known as: Voltaren  Apply 4 g topically 3 (three) times daily as needed (pain).   docusate sodium  100 MG capsule Commonly known as: COLACE Take 100 mg by mouth in the morning and at bedtime.   ertapenem  1 g injection Commonly known as: INVANZ  Inject 1,000 mg (1 g total) into the muscle daily for 2 days.   ferrous sulfate  325 (65 FE) MG tablet Take  1 tablet (325 mg total) by mouth daily with breakfast. Start taking on: September 15, 2024   fluticasone  50 MCG/ACT nasal spray Commonly known as: FLONASE  Place 2 sprays into both nostrils daily.   levothyroxine  175 MCG tablet Commonly known as: SYNTHROID  Take 175 mcg by mouth daily before breakfast.   metoCLOPramide  10 MG tablet Commonly known as: REGLAN  Take 10 mg by mouth 2 (two) times daily before a meal.   montelukast  10 MG tablet Commonly known as: SINGULAIR  TAKE ONE TABLET BY MOUTH ONCE DAILY IN THE EVENING   Multivitamin Gummies Mens Chew Chew 2 each by mouth daily.   oxyCODONE  5 MG immediate release tablet Commonly known as: Oxy IR/ROXICODONE  Take 5 mg by mouth every 8 (eight) hours as needed (knee pain).   OXYGEN Inhale 2 L/min into the lungs continuous.   pantoprazole  40 MG tablet Commonly known as: PROTONIX  Take 40 mg by mouth in the morning and at bedtime.   Spiriva  Respimat 2.5 MCG/ACT Aers Generic drug: Tiotropium Bromide  Inhale 2 puffs into the lungs daily.   tamsulosin  0.4  MG Caps capsule Commonly known as: FLOMAX  Take 0.4 mg by mouth at bedtime.   triamcinolone  cream 0.5 % Commonly known as: KENALOG  Apply 1 Application topically as directed.   UNABLE TO FIND Inhale 1 Device into the lungs at bedtime. CPAP; Apply CPAP mask at bedtime.   venlafaxine  XR 37.5 MG 24 hr capsule Commonly known as: EFFEXOR -XR Take 37.5 mg by mouth daily.        Contact information for after-discharge care     Destination     [facility] .   Service: Assisted Living Contact information: [address] [place]  [address] [phone]                    Discharge Exam:    09/14/2024    5:16 AM 09/14/2024    5:12 AM 09/13/2024    7:20 PM  Vitals with BMI  Systolic 105 94 122  Diastolic 59 59 54  Pulse 92 98   General - Elderly obese Caucasian male, no acute distress. HEENT - PERRLA, EOMI, atraumatic head, non tender  sinuses. Lung - Clear, basal rales, rhonchi, no wheezes. Heart - S1, S2 heard, no murmurs, rubs, trace pedal edema. Abdomen - Soft, non tender, obese, bowel sounds heard Neuro - Alert, awake and oriented, non focal exam. Skin - Warm and dry.  Condition at discharge: stable  The results of significant diagnostics from this hospitalization (including imaging, microbiology, ancillary and laboratory) are listed below for reference.   Imaging Studies: CT ABDOMEN PELVIS WO CONTRAST Result Date: 09/10/2024 CLINICAL DATA:  Sepsis EXAM: CT ABDOMEN AND PELVIS WITHOUT CONTRAST TECHNIQUE: Multidetector CT imaging of the abdomen and pelvis was performed following the standard protocol without IV contrast. RADIATION DOSE REDUCTION: This exam was performed according to the departmental dose-optimization program which includes automated exposure control, adjustment of the mA and/or kV according to patient size and/or use of iterative reconstruction technique. COMPARISON:  01/20/2024 FINDINGS: Lower chest: Bilateral lower lobe interstitial thickening with architectural distortion and volume loss. Trace left pleural fluid or thickening. Mild cardiomegaly with prior TAVR. Median sternotomy with native coronary artery calcification. Pacer/ICD. Hepatobiliary: Normal liver. Normal gallbladder, without biliary ductal dilatation. Pancreas: Normal, without mass or ductal dilatation. Spleen: Normal in size, without focal abnormality. Adrenals/Urinary Tract: Normal adrenal glands. Mild renal cortical thinning bilaterally. No renal calculi or hydronephrosis. No hydroureter or ureteric calculi. No bladder calculi. Stomach/Bowel: Tiny hiatal hernia. Colonic stool burden suggests constipation. Normal terminal ileum. Normal small bowel. Vascular/Lymphatic: Advanced aortic and branch vessel atherosclerosis. No abdominopelvic adenopathy. Reproductive: Normal prostate. Other: No free intraperitoneal air.  No significant free fluid.  Musculoskeletal: Advanced lumbosacral spondylosis. IMPRESSION: 1. No acute process in the abdomen or pelvis. 2. Possible constipation. 3.  Aortic Atherosclerosis (ICD10-I70.0). Electronically Signed   By: Rockey Kilts M.D.   On: 09/10/2024 15:43   DG Chest 2 View Result Date: 09/07/2024 EXAM: 2 VIEW(S) XRAY OF THE CHEST 09/07/2024 05:12:00 PM COMPARISON: 08/30/2024 CLINICAL HISTORY: FINDINGS: LUNGS AND PLEURA: No focal pulmonary opacity. No pleural effusion. No pneumothorax. HEART AND MEDIASTINUM: Heart size at upper limits. Stable left AICD. Aortic valve noted. Aortic atherosclerosis (ICD10-170.0). BONES AND SOFT TISSUES: Sternotomy wires noted. Vertebral endplate spurring at multiple levels in lower thoracic spine. IMPRESSION: 1. No acute findings. 2. Postsurgical changes of prior sternotomy with aortic valve replacement and stable left AICD. 3. Aortic atherosclerosis. 4. Heart size at the upper limits of normal. 5. Chronic vertebral endplate spurring in the lower thoracic spine.  Electronically signed by: Dorethia Molt MD 09/07/2024 05:21 PM EST RP Workstation: [id number]   CUP PACEART REMOTE DEVICE CHECK Result Date: 09/01/2024 ICD: Scheduled remote reviewed. Normal device function.  Presenting rhythm: AF/BiVP Permanent AF, Pradaxa , good V-rate control Next remote transmission per protocol. ML, CVRS  DG Chest 2 View Result Date: 08/30/2024 CLINICAL DATA:  Chest congestion. EXAM: CHEST - 2 VIEW COMPARISON:  08/03/2024 and CT chest 01/27/2024. FINDINGS: Trachea is midline. Heart is enlarged, stable. Pacemaker and ICD lead tips are stable in position. Aortic valve replacement. Biapical pleural thickening. Mild mid and lower lung zone scarring, as on CT chest 01/27/2024. No airspace consolidation. Probable small chronic bilateral pleural effusions. IMPRESSION: 1. Probable small chronic bilateral pleural effusions. 2. Otherwise, no acute findings. Electronically Signed   By: Newell Eke M.D.   On:  08/30/2024 13:09    Microbiology: Results for orders placed or performed during the hospital encounter of 09/07/24  Resp panel by RT-PCR (RSV, Flu A&B, Covid) Anterior Nasal Swab     Status: None   Collection Time: 09/07/24  4:55 PM   Specimen: Anterior Nasal Swab  Result Value Ref Range Status   SARS Coronavirus 2 by RT PCR NEGATIVE NEGATIVE Final    Comment: (NOTE) SARS-CoV-2 target nucleic acids are NOT DETECTED.  The SARS-CoV-2 RNA is generally detectable in upper respiratory specimens during the acute phase of infection. The lowest concentration of SARS-CoV-2 viral copies this assay can detect is 138 copies/mL. A negative result does not preclude SARS-Cov-2 infection and should not be used as the sole basis for treatment or other patient management decisions. A negative result may occur with  improper specimen collection/handling, submission of specimen other than nasopharyngeal swab, presence of viral mutation(s) within the areas targeted by this assay, and inadequate number of viral copies(<138 copies/mL). A negative result must be combined with clinical observations, patient history, and epidemiological information. The expected result is Negative.  Fact Sheet for Patients:  bloggercourse.com  Fact Sheet for Healthcare Providers:  seriousbroker.it  This test is no t yet approved or cleared by the [place]  FDA and  has been authorized for detection and/or diagnosis of SARS-CoV-2 by FDA under an Emergency Use Authorization (EUA). This EUA will remain  in effect (meaning this test can be used) for the duration of the COVID-19 declaration under Section 564(b)(1) of the Act, 21 U.S.C.section 360bbb-3(b)(1), unless the authorization is terminated  or revoked sooner.       Influenza A by PCR NEGATIVE NEGATIVE Final   Influenza B by PCR NEGATIVE NEGATIVE Final    Comment: (NOTE) The Xpert Xpress SARS-CoV-2/FLU/RSV plus  assay is intended as an aid in the diagnosis of influenza from Nasopharyngeal swab specimens and should not be used as a sole basis for treatment. Nasal washings and aspirates are unacceptable for Xpert Xpress SARS-CoV-2/FLU/RSV testing.  Fact Sheet for Patients: bloggercourse.com  Fact Sheet for Healthcare Providers: seriousbroker.it  This test is not yet approved or cleared by the [place]  FDA and has been authorized for detection and/or diagnosis of SARS-CoV-2 by FDA under an Emergency Use Authorization (EUA). This EUA will remain in effect (meaning this test can be used) for the duration of the COVID-19 declaration under Section 564(b)(1) of the Act, 21 U.S.C. section 360bbb-3(b)(1), unless the authorization is terminated or revoked.     Resp Syncytial Virus by PCR NEGATIVE NEGATIVE Final    Comment: (NOTE) Fact Sheet for Patients: bloggercourse.com  Fact Sheet for Healthcare Providers: seriousbroker.it  This test  is not yet approved or cleared by the [place]  FDA and has been authorized for detection and/or diagnosis of SARS-CoV-2 by FDA under an Emergency Use Authorization (EUA). This EUA will remain in effect (meaning this test can be used) for the duration of the COVID-19 declaration under Section 564(b)(1) of the Act, 21 U.S.C. section 360bbb-3(b)(1), unless the authorization is terminated or revoked.  Performed at [facility], [address], [place], [place] [address]   Blood culture (routine x 2)     Status: None   Collection Time: 09/08/24 12:01 AM   Specimen: BLOOD  Result Value Ref Range Status   Specimen Description   Final    BLOOD RIGHT ANTECUBITAL Performed at [facility], [address], [place], [place] [address]    Special Requests   Final    BOTTLES DRAWN AEROBIC AND ANAEROBIC Blood Culture adequate  volume Performed at [facility], [address], [place], [place] [address]    Culture   Final    NO GROWTH 5 DAYS Performed at [facility], [address], [place], [place] [address]    Report Status 09/13/2024 FINAL  Final  Blood culture (routine x 2)     Status: None   Collection Time: 09/08/24  6:56 AM   Specimen: BLOOD  Result Value Ref Range Status   Specimen Description   Final    BLOOD BLOOD LEFT ARM Performed at [facility], [address], [place], [place] [address]    Special Requests   Final    BOTTLES DRAWN AEROBIC AND ANAEROBIC Blood Culture adequate volume Performed at [facility], [address], [place], [place] [address]    Culture   Final    NO GROWTH 5 DAYS Performed at [facility], [address], [place], [place] [address]    Report Status 09/13/2024 FINAL  Final  Urine Culture (for pregnant, neutropenic or urologic patients or patients with an indwelling urinary catheter)     Status: Abnormal   Collection Time: 09/08/24 11:14 AM   Specimen: Urine, Clean Catch  Result Value Ref Range Status   Specimen Description   Final    URINE, CLEAN CATCH Performed at [facility], [address] [place], [place] [address]    Special Requests   Final    NONE Performed at [facility], [address] [place], [place] [address]    Culture (A)  Final    >=100,000 COLONIES/mL ESCHERICHIA COLI Confirmed Extended Spectrum Beta-Lactamase Producer (ESBL).  In bloodstream infections from ESBL organisms, carbapenems are preferred over piperacillin/tazobactam. They are shown to have a lower risk of mortality.    Report Status 09/10/2024 FINAL  Final   Organism ID, Bacteria ESCHERICHIA COLI (A)  Final      Susceptibility   Escherichia coli - MIC*    AMPICILLIN >=32 RESISTANT Resistant     CEFAZOLIN  (URINE) Value in next row Resistant      >=32 RESISTANTThis is  a modified FDA-approved test that has been validated and its performance characteristics determined by the reporting laboratory.  This laboratory is certified under the Clinical Laboratory Improvement Amendments CLIA as qualified to perform high complexity clinical laboratory testing.    CEFEPIME Value in next row Resistant      >=32 RESISTANTThis is a modified FDA-approved test that has been validated and its performance characteristics determined by the reporting laboratory.  This laboratory is certified  under the Clinical Laboratory Improvement Amendments CLIA as qualified to perform high complexity clinical laboratory testing.    ERTAPENEM  Value in next row Sensitive      >=32 RESISTANTThis is a modified FDA-approved test that has been validated and its performance characteristics determined by the reporting laboratory.  This laboratory is certified under the Clinical Laboratory Improvement Amendments CLIA as qualified to perform high complexity clinical laboratory testing.    CEFTRIAXONE  Value in next row Resistant      >=32 RESISTANTThis is a modified FDA-approved test that has been validated and its performance characteristics determined by the reporting laboratory.  This laboratory is certified under the Clinical Laboratory Improvement Amendments CLIA as qualified to perform high complexity clinical laboratory testing.    CIPROFLOXACIN  Value in next row Resistant      >=32 RESISTANTThis is a modified FDA-approved test that has been validated and its performance characteristics determined by the reporting laboratory.  This laboratory is certified under the Clinical Laboratory Improvement Amendments CLIA as qualified to perform high complexity clinical laboratory testing.    GENTAMICIN  Value in next row Sensitive      >=32 RESISTANTThis is a modified FDA-approved test that has been validated and its performance characteristics determined by the reporting laboratory.  This laboratory is certified  under the Clinical Laboratory Improvement Amendments CLIA as qualified to perform high complexity clinical laboratory testing.    NITROFURANTOIN Value in next row Sensitive      >=32 RESISTANTThis is a modified FDA-approved test that has been validated and its performance characteristics determined by the reporting laboratory.  This laboratory is certified under the Clinical Laboratory Improvement Amendments CLIA as qualified to perform high complexity clinical laboratory testing.    TRIMETH/SULFA Value in next row Sensitive      >=32 RESISTANTThis is a modified FDA-approved test that has been validated and its performance characteristics determined by the reporting laboratory.  This laboratory is certified under the Clinical Laboratory Improvement Amendments CLIA as qualified to perform high complexity clinical laboratory testing.    AMPICILLIN/SULBACTAM Value in next row Sensitive      >=32 RESISTANTThis is a modified FDA-approved test that has been validated and its performance characteristics determined by the reporting laboratory.  This laboratory is certified under the Clinical Laboratory Improvement Amendments CLIA as qualified to perform high complexity clinical laboratory testing.    PIP/TAZO Value in next row Sensitive      <=4 SENSITIVEThis is a modified FDA-approved test that has been validated and its performance characteristics determined by the reporting laboratory.  This laboratory is certified under the Clinical Laboratory Improvement Amendments CLIA as qualified to perform high complexity clinical laboratory testing.    MEROPENEM  Value in next row Sensitive      <=4 SENSITIVEThis is a modified FDA-approved test that has been validated and its performance characteristics determined by the reporting laboratory.  This laboratory is certified under the Clinical Laboratory Improvement Amendments CLIA as qualified to perform high complexity clinical laboratory testing.    * >=100,000  COLONIES/mL ESCHERICHIA COLI  Respiratory (~20 pathogens) panel by PCR     Status: None   Collection Time: 09/09/24  1:24 PM   Specimen: Nasopharyngeal Swab; Respiratory  Result Value Ref Range Status   Adenovirus NOT DETECTED NOT DETECTED Final   Coronavirus 229E NOT DETECTED NOT DETECTED Final    Comment: (NOTE) The Coronavirus on the Respiratory Panel, DOES NOT test for the novel  Coronavirus (2019 nCoV)    Coronavirus HKU1 NOT DETECTED NOT  DETECTED Final   Coronavirus NL63 NOT DETECTED NOT DETECTED Final   Coronavirus OC43 NOT DETECTED NOT DETECTED Final   Metapneumovirus NOT DETECTED NOT DETECTED Final   Rhinovirus / Enterovirus NOT DETECTED NOT DETECTED Final   Influenza A NOT DETECTED NOT DETECTED Final   Influenza B NOT DETECTED NOT DETECTED Final   Parainfluenza Virus 1 NOT DETECTED NOT DETECTED Final   Parainfluenza Virus 2 NOT DETECTED NOT DETECTED Final   Parainfluenza Virus 3 NOT DETECTED NOT DETECTED Final   Parainfluenza Virus 4 NOT DETECTED NOT DETECTED Final   Respiratory Syncytial Virus NOT DETECTED NOT DETECTED Final   Bordetella pertussis NOT DETECTED NOT DETECTED Final   Bordetella Parapertussis NOT DETECTED NOT DETECTED Final   Chlamydophila pneumoniae NOT DETECTED NOT DETECTED Final   Mycoplasma pneumoniae NOT DETECTED NOT DETECTED Final    Comment: Performed at [facility], [address], [place], [place] [address]   *Note: Due to a large number of results and/or encounters for the requested time period, some results have not been displayed. A complete set of results can be found in Results Review.    Labs: CBC: Recent Labs  Lab 09/07/24 2311 09/07/24 2338 09/10/24 0513 09/11/24 0457 09/12/24 0503 09/13/24 0418 09/14/24 0432  WBC 26.3*   < > 16.4* 14.4* 17.9* 13.3* 13.8*  NEUTROABS 21.4*  --   --   --   --   --   --   HGB 14.6   < > 12.4* 12.3* 12.3* 12.3* 12.4*  HCT 44.9   < > 38.8* 38.3* 39.6 39.0 38.2*  MCV 83.0   < > 83.4 85.1 86.8  86.3 83.2  PLT 427*   < > 330 339 340 304 320   < > = values in this interval not displayed.   Basic Metabolic Panel: Recent Labs  Lab 09/10/24 0513 09/11/24 0457 09/12/24 0503 09/13/24 0418 09/14/24 0432  NA 132* 136 132* 133* 133*  K 3.1* 3.8 5.2* 4.3 4.2  CL 90* 95* 95* 96* 97*  CO2 31 33* 30 28 26   GLUCOSE 143* 134* 140* 118* 120*  BUN 53* 42* 40* 34* 28*  CREATININE 1.91* 1.65* 1.72* 1.52* 1.50*  CALCIUM  8.6* 9.2 9.1 9.1 8.9   Liver Function Tests: Recent Labs  Lab 09/08/24 0656  AST 28  ALT 32  ALKPHOS 137*  BILITOT 0.5  PROT 6.8  ALBUMIN  2.8*   CBG: No results for input(s): GLUCAP in the last 168 hours.  Discharge time spent: 38 minutes.  Signed: Concepcion Riser, MD [facility] 09/14/2024 "

## 2024-09-15 ENCOUNTER — Encounter (HOSPITAL_COMMUNITY): Payer: Self-pay

## 2024-09-15 ENCOUNTER — Encounter: Payer: Self-pay | Admitting: Adult Health

## 2024-09-15 ENCOUNTER — Ambulatory Visit (HOSPITAL_COMMUNITY)
Admission: RE | Admit: 2024-09-15 | Discharge: 2024-09-15 | Disposition: A | Discharge status: Home / Self Care | Source: Ambulatory Visit | Attending: Family Medicine | Admitting: Family Medicine

## 2024-09-15 ENCOUNTER — Non-Acute Institutional Stay: Admitting: Adult Health

## 2024-09-15 ENCOUNTER — Ambulatory Visit: Admitting: Physician Assistant

## 2024-09-15 VITALS — BP 128/82 | HR 108 | Ht 66.0 in | Wt 257.0 lb

## 2024-09-15 DIAGNOSIS — N390 Urinary tract infection, site not specified: Secondary | ICD-10-CM

## 2024-09-15 DIAGNOSIS — N1831 Chronic kidney disease, stage 3a: Secondary | ICD-10-CM

## 2024-09-15 DIAGNOSIS — Z4502 Encounter for adjustment and management of automatic implantable cardiac defibrillator: Secondary | ICD-10-CM | POA: Diagnosis not present

## 2024-09-15 DIAGNOSIS — N179 Acute kidney failure, unspecified: Secondary | ICD-10-CM | POA: Insufficient documentation

## 2024-09-15 DIAGNOSIS — D508 Other iron deficiency anemias: Secondary | ICD-10-CM | POA: Diagnosis not present

## 2024-09-15 DIAGNOSIS — I5022 Chronic systolic (congestive) heart failure: Secondary | ICD-10-CM

## 2024-09-15 DIAGNOSIS — E1169 Type 2 diabetes mellitus with other specified complication: Secondary | ICD-10-CM | POA: Diagnosis not present

## 2024-09-15 DIAGNOSIS — I4819 Other persistent atrial fibrillation: Secondary | ICD-10-CM | POA: Diagnosis not present

## 2024-09-15 DIAGNOSIS — I2510 Atherosclerotic heart disease of native coronary artery without angina pectoris: Secondary | ICD-10-CM | POA: Diagnosis not present

## 2024-09-15 DIAGNOSIS — Z952 Presence of prosthetic heart valve: Secondary | ICD-10-CM | POA: Insufficient documentation

## 2024-09-15 DIAGNOSIS — R9431 Abnormal electrocardiogram [ECG] [EKG]: Secondary | ICD-10-CM | POA: Insufficient documentation

## 2024-09-15 DIAGNOSIS — Z66 Do not resuscitate: Secondary | ICD-10-CM | POA: Insufficient documentation

## 2024-09-15 DIAGNOSIS — E039 Hypothyroidism, unspecified: Secondary | ICD-10-CM | POA: Insufficient documentation

## 2024-09-15 DIAGNOSIS — K219 Gastro-esophageal reflux disease without esophagitis: Secondary | ICD-10-CM | POA: Diagnosis not present

## 2024-09-15 DIAGNOSIS — Z951 Presence of aortocoronary bypass graft: Secondary | ICD-10-CM | POA: Insufficient documentation

## 2024-09-15 DIAGNOSIS — I255 Ischemic cardiomyopathy: Secondary | ICD-10-CM | POA: Diagnosis not present

## 2024-09-15 DIAGNOSIS — Z1612 Extended spectrum beta lactamase (ESBL) resistance: Secondary | ICD-10-CM | POA: Diagnosis not present

## 2024-09-15 DIAGNOSIS — Z79899 Other long term (current) drug therapy: Secondary | ICD-10-CM | POA: Diagnosis not present

## 2024-09-15 DIAGNOSIS — Z7901 Long term (current) use of anticoagulants: Secondary | ICD-10-CM | POA: Insufficient documentation

## 2024-09-15 DIAGNOSIS — G4733 Obstructive sleep apnea (adult) (pediatric): Secondary | ICD-10-CM | POA: Diagnosis not present

## 2024-09-15 DIAGNOSIS — B9629 Other Escherichia coli [E. coli] as the cause of diseases classified elsewhere: Secondary | ICD-10-CM

## 2024-09-15 DIAGNOSIS — Z79890 Hormone replacement therapy (postmenopausal): Secondary | ICD-10-CM | POA: Diagnosis not present

## 2024-09-15 DIAGNOSIS — I4821 Permanent atrial fibrillation: Secondary | ICD-10-CM | POA: Insufficient documentation

## 2024-09-15 DIAGNOSIS — N183 Chronic kidney disease, stage 3 unspecified: Secondary | ICD-10-CM | POA: Diagnosis not present

## 2024-09-15 DIAGNOSIS — Z9981 Dependence on supplemental oxygen: Secondary | ICD-10-CM | POA: Diagnosis not present

## 2024-09-15 DIAGNOSIS — I493 Ventricular premature depolarization: Secondary | ICD-10-CM | POA: Insufficient documentation

## 2024-09-15 DIAGNOSIS — B9620 Unspecified Escherichia coli [E. coli] as the cause of diseases classified elsewhere: Secondary | ICD-10-CM | POA: Diagnosis not present

## 2024-09-15 DIAGNOSIS — I350 Nonrheumatic aortic (valve) stenosis: Secondary | ICD-10-CM | POA: Diagnosis not present

## 2024-09-15 MED ORDER — POTASSIUM CHLORIDE CRYS ER 20 MEQ PO TBCR: 40.0000 meq | EXTENDED_RELEASE_TABLET | Freq: Every day | ORAL | 3 refills | Status: DC

## 2024-09-15 MED ORDER — METOPROLOL SUCCINATE ER 25 MG PO TB24: 25.0000 mg | ORAL_TABLET | Freq: Every evening | ORAL | 3 refills | Status: AC

## 2024-09-15 MED ORDER — TORSEMIDE 20 MG PO TABS: 40.0000 mg | ORAL_TABLET | Freq: Every day | ORAL | 11 refills | Status: DC

## 2024-09-15 NOTE — Patient Instructions (Signed)
 Thank you for coming in today  If you had labs drawn today, any labs that are abnormal the clinic will call you No news is good news  Medications: Restart torsemide  40 mg 2 tablets daily  Restart potassium 40 meq 2 tablets daily Restart Toprol  XL 25 mg 1 tablet nightly   Follow up appointments:  Your physician recommends that you schedule a follow-up appointment in:  1 week in clinic 09/23/2024   Do the following things EVERYDAY: Weigh yourself in the morning before breakfast. Write it down and keep it in a log. Take your medicines as prescribed Eat low salt foods--Limit salt (sodium) to 2000 mg per day.  Stay as active as you can everyday Limit all fluids for the day to less than 2 liters   At the [facility], you and your health needs are our priority. As part of our continuing mission to provide you with exceptional heart care, we have created designated Provider Care Teams. These Care Teams include your primary Cardiologist (physician) and Advanced Practice Providers (APPs- Physician Assistants and Nurse Practitioners) who all work together to provide you with the care you need, when you need it.   You may see any of the following providers on your designated Care Team at your next follow up: Dr Toribio Fuel Dr Ezra Shuck Dr. Ria Gardenia Greig Lenetta, NP Caffie Shed, [place] [facility] [place], [place] Beckey Coe, NP Tinnie Redman, PharmD   Please be sure to bring in all your medications bottles to every appointment.    Thank you for choosing [facility]  If you have any questions or concerns before your next appointment please send us  a message through [facility] or call our office at [phone].    TO LEAVE A MESSAGE FOR THE NURSE SELECT OPTION 2, PLEASE LEAVE A MESSAGE INCLUDING: YOUR NAME DATE OF BIRTH CALL BACK NUMBER REASON FOR CALL**this is important as we prioritize the call  backs  YOU WILL RECEIVE A CALL BACK THE SAME DAY AS LONG AS YOU CALL BEFORE 4:00 PM

## 2024-09-15 NOTE — Progress Notes (Signed)
 "  ADVANCED HEART FAILURE FOLLOW UP CLINIC NOTE  Primary Care: Mast, Man X, NP Primary Cardiologist: Dr. Zenaida  HPI: Justin [name] is a 89 y.o. male with permanent atrial fibrillation, aortic stenosis s/p TAVR, medtronic BiV ICD in 2018 after TAVR, CKD, CAD s/p CABG in 2007 who presents for follow up of chronic systolic heart failure.     S/p ICD generator changed 10/24. EF 40-45% at the time. Enrolled in iCM monitoring.   Admitted 3/25 for pneumonia and compressive atelectasis 2/2 COVID and flu infection c/b UTI. S/p thoracentesis 1L. EF 40-45% with mod reduced RV, normal AoV prosthetic. He was discharged to SNF.    Follow up 08/03/24 with Dr. Verlin, BP 65/38, EMS called and he was sent to ED where he was admitted with AKI. GDMT held, SCr 2.56>>1.93. Meds gradually re-started as BP and renal function improved. He remained DNR. He was discharged back to facility, weight 263 lbs.  Admitted 1/26 with ESBL E Coli urosepsis, complicated by AKI on CKD3.  Treated with abx, most GDMT stopped due to AKI. Discharged back to SNF, weight 249 lbs.     SUBJECTIVE:  Today he returns for post hospital HF follow up with his daughter. Overall feeling fine. Was discharged last night. He is SOB walking further distances on flat ground. Sleeps in adjustable bed, sleeps on 1 pillow. Daughter says he should be able to use Furscix at ALF if needed. Denies palpitations, abnormal bleeding, CP, dizziness, edema, or PND.SABRA Appetite ok. Off torsemide , Toprol , spiro and metolazone .  PMH, current medications, allergies, social history, and family history reviewed in epic.  Wt Readings from Last 3 Encounters:  09/15/24 116.6 kg (257 lb)  09/15/24 113.2 kg (249 lb 9.6 oz)  09/06/24 113.4 kg (250 lb)   BP 128/82   Pulse (!) 108   Ht 5' 6 (1.676 m)   Wt 116.6 kg (257 lb)   SpO2 99%   BMI 41.48 kg/m   PHYSICAL EXAM: General:  NAD. No resp difficulty, arrived in [facility], elderly and chronically-ill  appearing HEENT: Normal Neck: Supple. No JVD. Thick neck Cor: Regular rate & rhythm. No rubs, gallops or murmurs. Lungs: Clear Abdomen: Soft, obese, nontender, nondistended.  Extremities: No cyanosis, clubbing, rash, trace BLE edema Neuro: Alert & oriented x 3, moves all 4 extremities w/o difficulty. Affect pleasant.  ECG (personally reviewed): V paced with PVCs, 102 bpm  ReDs reading: 40 %, abnormal  Device interrogation (personally reviewed): OptiVol looks good, stable thoracici mpedence, 100% AF, no VT, 0.3 hr/day activity, 65.2% BV pacing  DATA REVIEW  ECHO: 11/12/22: LVEF 45-50%, RV function low normal.  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  - NYHA class IIIb, volume ok but suspect he will start becoming hypervolemic soon. Off all diuretics 2/2 to AKI. - Restart torsemide  40 mg daily + 40 KCL daily (previously on 100 bid) - Restart Toprol  XL 25 mg at bedtime - Add back spiro next visit if able. - Will likely require torsemide  100 bid + 1-2x week metolazone  to keep volume stable - Daughter says ALF agreeable to using PRN Furoscix , will need to figure out logistics next visit. - No SGLT2 with UTI, no RAASi with CKD - Unfortunately near end stage, not yet considering  palliative care - Labs today   Permanent Atrial fibrillation & frequent PVCs  - Follow with EP, enrolled in iCM. - Rate mildly elevated today - Restart Toprol  as above - Continue amiodarone  200 mg daily. Follow LFTs and TSH - Continue Pradaxa  150 mg bid. No bleeding issues.   CAD   - CABG in 2007. - LHC in 2018. - No chest pain - Stop ASA at next visit - Continue atorvastatin  20 mg daily.   Aortic stenosis  - s/p TAVR - CRT-D placed due to syncope and  TAVR. - no change.   Hypothyroidism - on synthroid   - Per PCP   Dyspnea - Likely multifactorial.   - Continue home oxygen - PFTs suggest interstitial process with mild diffusion defect; DLCO 60% pred. Will need to follow annually while on amiodarone    Follow up in 1 week with APP for GDMT titration  [name], FNP-BC [facility] 09/15/24 "

## 2024-09-15 NOTE — Progress Notes (Signed)
 "  Location:  [facility] Room Number: [place] [address] Floor [id number] Place of Service:  ALF [id number]) Provider:  Medina-Vargas, Jumana Paccione, DNP, FNP-BC  Patient Care Team: Mast, Man X, NP as PCP - General (Internal Medicine) [name], MD as PCP - Cardiology (Cardiology) [name] Chew, MD as PCP - Electrophysiology (Cardiology) Nieves Cough, MD as Consulting Physician (Urology) Liane Sharyne MATSU, [facility] (Inactive) as Pharmacist (Pharmacist) Leslee Reusing, MD as Consulting Physician (Ophthalmology)  Extended Emergency Contact Information Primary Emergency Contact: [facility] Address: [address]          [place], [place] [address] [place] Home Phone: [phone] Mobile Phone: [phone] Relation: Daughter Secondary Emergency Contact: Billie Risen Address: [address]          [place], [place] [address] [place] Home Phone: [phone] Mobile Phone: [phone] Relation: Daughter  Code Status:  DNR  Goals of care: Advanced Directive information    09/15/2024    9:55 AM  Advanced Directives  Does Patient Have a Medical Advance Directive? Yes  Type of Advance Directive Out of facility DNR (pink MOST or yellow form)  Does patient want to make changes to medical advance directive? No - Patient declined     Chief Complaint  Patient presents with   ADMISSION    [facility]F     HPI:  Pt is a 89 y.o. male seen today for hospitalization follow up. He was hospitalized 09/07/24 to 09/14/24. He was re-admitted to [facility] on 09/14/24. He was hospitalized due to severe sepsis/ESBL E. Coli UTI. He completed Meropenem  for a total of 5 days. CT abdomen/pelvis showed no pyelonephritis. He was discharged with Invanz  for 2 more days. WBC trended down from 26K to 13.8.  He has acute on CKD stage 3A in the setting of dehydration, sepsis and hypotension. Held diuretics and blood pressure  medications.  He was seen in his room today. He is ambulatory with use of walker. No noted shortness of breath.    Past Medical History:  Diagnosis Date   Age-related macular degeneration, dry, both eyes    Allergic    24/7; 365 days/year; I'm allergic to pollens, dust, all southern grasses/trees, mold, mildue, cats, dogs (01/07/2017)   Anal fissure    Asthma    sees Dr. Alaine    Benign prostatic hypertrophy    (sees Dr. Colan   CAD (coronary artery disease)    a. s/p CABG 2007. b. Cath 08/2016 - 4/4 patent grafts.   Carotid bruit    carotid u/s 10/10: 0.39% bilaterally   Chronic combined systolic and diastolic CHF (congestive heart failure) (HCC)    Complication of anesthesia 1980s   w/anal cyst OR, he gave me a saddle block then put a narcotic in spinal cord; had a severe reaction to that (01/07/2017)   Congestive heart failure (CHF) (HCC)    Coronavirus infection 11/18/2023   ED (erectile dysfunction)    Family history of adverse reaction to anesthesia    daughter wakes up during OR (01/07/2017)   GERD (gastroesophageal reflux disease)    Gout    HTN (hypertension)    Hx of colonic polyps    (sees Dr. Abran)   Hyperlipidemia    Hypothyroidism    Moderate to severe aortic stenosis    a. s/p TAVR 02/2017.   Myocardial infarction [facility]) ~ 2000   Obesity    Osteoarthritis    was in my knees, hands (01/07/2017 )  PAF (paroxysmal atrial fibrillation) (HCC)    a. documented post TAVR.   Precancerous skin lesion    (sees Dr. 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   29 mm Edwards Sapien 3 transcatheter heart valve placed via percutaneous right transfemoral approach   Past Surgical History:  Procedure Laterality Date   ANUS SURGERY     opened it back up cause it wouldn't heal; wound up w/a fissure (01/07/2017)   BIV ICD INSERTION CRT-D N/A 05/02/2017   Procedure: BIV  ICD INSERTION CRT-D;  Surgeon: [name], MD;  Location: [facility] INVASIVE CV LAB;  Service: Cardiovascular;  Laterality: N/A;   CARDIAC CATHETERIZATION  10/29/2005   CARDIAC CATHETERIZATION N/A 08/27/2016   Procedure: Right/Left Heart Cath and Coronary/Graft Angiography;  Surgeon: Lonni JONETTA Cash, MD;  Location: [facility] INVASIVE CV LAB;  Service: Cardiovascular;  Laterality: N/A;   CARDIOVERSION N/A 04/24/2021   Procedure: CARDIOVERSION;  Surgeon: Okey Vina GAILS, MD;  Location: [facility] ENDOSCOPY;  Service: Cardiovascular;  Laterality: N/A;   CARDIOVERSION N/A 11/08/2021   Procedure: CARDIOVERSION;  Surgeon: Mona Vinie BROCKS, MD;  Location: [facility] ENDOSCOPY;  Service: Cardiovascular;  Laterality: N/A;   CARDIOVERSION N/A 02/06/2022   Procedure: CARDIOVERSION;  Surgeon: Delford Maude BROCKS, MD;  Location: [facility] ENDOSCOPY;  Service: Cardiovascular;  Laterality: N/A;   CATARACT EXTRACTION W/ INTRAOCULAR LENS  IMPLANT, BILATERAL Bilateral    CATARACT EXTRACTION, BILATERAL  2012   COLONOSCOPY  06/30/2008   no repeats needed    COLONOSCOPY     had 3 or 4 in the past    CORONARY ARTERY BYPASS GRAFT  2007   CABG X4   CYST EXCISION PERINEAL  1980s   HAMMER TOE SURGERY Bilateral    ICD GENERATOR CHANGEOUT N/A 05/30/2023   Procedure: ICD GENERATOR CHANGEOUT;  Surgeon: [name], MD;  Location: [facility] INVASIVE CV LAB;  Service: Cardiovascular;  Laterality: N/A;   JOINT REPLACEMENT     KNEE ARTHROPLASTY  07/30/2011   Procedure: COMPUTER ASSISTED TOTAL KNEE ARTHROPLASTY;  Surgeon: Cordella Glendia Hutchinson;  Location: [facility] OR;  Service: Orthopedics;  Laterality: Left;  left total knee arthroplasty   MASTECTOMY SUBCUTANEOUS Bilateral    MULTIPLE TOOTH EXTRACTIONS     ORIF FINGER / THUMB FRACTURE Right ~ 1980   repair of thumb injury   PROSTATE BIOPSY     REPLACEMENT TOTAL KNEE BILATERAL Bilateral 2012   TEE WITHOUT CARDIOVERSION N/A 03/04/2017   Procedure: TRANSESOPHAGEAL ECHOCARDIOGRAM (TEE);  Surgeon: Cash Lonni JONETTA,  MD;  Location: [facility] OR;  Service: Open Heart Surgery;  Laterality: N/A;   TOTAL KNEE REVISION Right 05/18/2019   Procedure: RIGHT PATELLA REVISION/REMOVAL;  Surgeon: Hutchinson Cordella Glendia, MD;  Location: [facility] OR;  Service: Orthopedics;  Laterality: Right;   TRANSCATHETER AORTIC VALVE REPLACEMENT, TRANSFEMORAL N/A 03/04/2017   Procedure: TRANSCATHETER AORTIC VALVE REPLACEMENT, TRANSFEMORAL;  Surgeon: Cash Lonni JONETTA, MD;  Location: [facility] OR;  Service: Open Heart Surgery;  Laterality: N/A;    Allergies[1]  Outpatient Encounter Medications as of 09/15/2024  Medication Sig   albuterol  (VENTOLIN  HFA) 108 (90 Base) MCG/ACT inhaler Inhale 2 puffs into the lungs every 4 (four) hours as needed for wheezing or shortness of breath.   allopurinol  (ZYLOPRIM ) 100 MG tablet Take 1 tablet (100 mg total) by mouth daily.   amiodarone  (PACERONE ) 200 MG tablet Take 1 tablet (200 mg total) by mouth daily.   aspirin  81  MG chewable tablet Chew 81 mg by mouth daily.   atorvastatin  (LIPITOR) 20 MG tablet Take 20 mg by mouth daily.   benzonatate  (TESSALON ) 100 MG capsule Take 100 mg by mouth 3 (three) times daily.   cetirizine (ZYRTEC) 10 MG tablet Take 5 mg by mouth daily.   dabigatran  (PRADAXA ) 150 MG CAPS capsule TAKE (1) CAPSULE TWICE DAILY.   diclofenac  Sodium (VOLTAREN ) 1 % GEL Apply 4 g topically 3 (three) times daily as needed (pain).   docusate sodium  (COLACE) 100 MG capsule Take 100 mg by mouth in the morning and at bedtime.   ertapenem  (INVANZ ) 1 g injection Inject 1,000 mg (1 g total) into the muscle daily for 2 days.   ferrous sulfate  325 (65 FE) MG tablet Take 1 tablet (325 mg total) by mouth daily with breakfast.   fluticasone  (FLONASE ) 50 MCG/ACT nasal spray Place 2 sprays into both nostrils daily.   levothyroxine  (SYNTHROID ) 175 MCG tablet Take 175 mcg by mouth daily before breakfast.   metoCLOPramide  (REGLAN ) 10 MG tablet Take 10 mg by mouth 2 (two) times daily before a meal.   montelukast  (SINGULAIR )  10 MG tablet TAKE ONE TABLET BY MOUTH ONCE DAILY IN THE EVENING   Multiple Vitamins-Minerals (MULTIVITAMIN GUMMIES MENS) CHEW Chew 2 each by mouth daily.   oxyCODONE  (OXY IR/ROXICODONE ) 5 MG immediate release tablet Take 5 mg by mouth every 8 (eight) hours as needed (knee pain).   pantoprazole  (PROTONIX ) 40 MG tablet Take 40 mg by mouth in the morning and at bedtime.   tamsulosin  (FLOMAX ) 0.4 MG CAPS capsule Take 0.4 mg by mouth at bedtime.   Tiotropium Bromide  Monohydrate (SPIRIVA  RESPIMAT) 2.5 MCG/ACT AERS Inhale 2 puffs into the lungs daily.   triamcinolone  cream (KENALOG ) 0.5 % Apply 1 Application topically as directed. (Patient taking differently: Apply 1 Application topically 2 (two) times daily. Apply to back topically two times a day for rash)   UNABLE TO FIND Inhale 1 Device into the lungs at bedtime. CPAP; Apply CPAP mask at bedtime.   venlafaxine  XR (EFFEXOR -XR) 37.5 MG 24 hr capsule Take 37.5 mg by mouth daily.   OXYGEN Inhale 2 L/min into the lungs continuous. (Patient not taking: Reported on 09/15/2024)   No facility-administered encounter medications on file as of 09/15/2024.    Review of Systems  Constitutional:  Negative for activity change, appetite change and fever.  HENT:  Negative for sore throat.   Eyes: Negative.   Cardiovascular:  Negative for chest pain and leg swelling.  Gastrointestinal:  Negative for abdominal distention, diarrhea and vomiting.  Genitourinary:  Negative for dysuria, frequency and urgency.  Skin:  Negative for color change.  Neurological:  Negative for dizziness and headaches.  Psychiatric/Behavioral:  Negative for behavioral problems and sleep disturbance. The patient is not nervous/anxious.      Immunization History  Administered Date(s) Administered   Fluad Quad(high Dose 65+) 05/06/2019, 05/30/2020, 06/18/2021, 05/27/2023, 06/09/2024   Hep A / Hep B 04/06/2012, 10/07/2012   Hepatitis B 05/07/2012   INFLUENZA, HIGH DOSE SEASONAL PF  05/15/2018, 05/31/2022   Influenza Split 06/28/2013   Influenza Whole 06/05/2007, 05/13/2008, 06/06/2009, 05/30/2010   Influenza,inj,Quad PF,6+ Mos 05/20/2016   Influenza-Unspecified 05/25/2014, 07/08/2015, 05/06/2017, 07/29/2023   Moderna Covid-19 Vaccine Bivalent Booster 54yrs & up 05/31/2022   PFIZER(Purple Top)SARS-COV-2 Vaccination 09/28/2019, 10/12/2019, 05/30/2020   PPD Test 03/31/2023   Pfizer Covid-19 Vaccine Bivalent Booster 31yrs & up 11/18/2021, 07/29/2023, 06/23/2024   Pneumococcal Conjugate-13 09/18/2015   Pneumococcal Polysaccharide-23 05/13/2008, 05/25/2014  Td 05/30/2010   Tdap 10/27/2020   Zoster Recombinant(Shingrix) 05/18/2018, 10/16/2018   Zoster, Live 10/21/2007   Pertinent  Health Maintenance Due  Topic Date Due   HEMOGLOBIN A1C  02/02/2025   OPHTHALMOLOGY EXAM  03/16/2025   FOOT EXAM  04/05/2025   Influenza Vaccine  Completed      03/31/2023   10:48 AM 10/09/2023    1:31 PM 11/13/2023    1:50 PM 12/31/2023    8:52 AM 02/10/2024    9:05 AM  Fall Risk  Falls in the past year? 0 0 0 0 0  Was there an injury with Fall? 0  0  0   0   Fall Risk Category Calculator 0 0 0  0  Patient at Risk for Falls Due to No Fall Risks History of fall(s) Impaired balance/gait;Impaired mobility    Fall risk Follow up Falls evaluation completed Falls evaluation completed Falls evaluation completed       Data saved with a previous flowsheet row definition     Vitals:   09/15/24 0939 09/15/24 0950  BP: (!) 146/78 (!) 141/69  Pulse: 92   Resp: 18   Temp: (!) 97.4 F (36.3 C)   SpO2: 99%   Weight: 249 lb 9.6 oz (113.2 kg)    Body mass index is 34.81 kg/m.  Physical Exam Constitutional:      Appearance: Normal appearance. He is obese.  HENT:     Head: Normocephalic and atraumatic.     Mouth/Throat:     Mouth: Mucous membranes are moist.  Eyes:     Conjunctiva/sclera: Conjunctivae normal.  Cardiovascular:     Rate and Rhythm: Normal rate. Rhythm irregular.      Pulses: Normal pulses.     Heart sounds: Normal heart sounds.     Comments: AICD on left chest Pulmonary:     Effort: Pulmonary effort is normal.     Breath sounds: Normal breath sounds.  Abdominal:     General: Bowel sounds are normal.     Palpations: Abdomen is soft.  Musculoskeletal:        General: No swelling. Normal range of motion.     Cervical back: Normal range of motion.  Skin:    General: Skin is warm and dry.  Neurological:     General: No focal deficit present.     Mental Status: He is alert and oriented to person, place, and time.  Psychiatric:        Mood and Affect: Mood normal.        Behavior: Behavior normal.        Thought Content: Thought content normal.        Judgment: Judgment normal.        Labs reviewed: Recent Labs    11/25/23 0728 11/26/23 0417 11/27/23 0410 12/16/23 0000 09/12/24 0503 09/13/24 0418 09/14/24 0432  NA 134* 134* 134*   < > 132* 133* 133*  K 4.0 3.8 3.8   < > 5.2* 4.3 4.2  CL 98 97* 96*   < > 95* 96* 97*  CO2 27 27 29    < > 30 28 26   GLUCOSE 87 94 106*   < > 140* 118* 120*  BUN 41* 43* 46*   < > 40* 34* 28*  CREATININE 1.13 1.11 1.32*   < > 1.72* 1.52* 1.50*  CALCIUM  7.8* 7.8* 7.7*   < > 9.1 9.1 8.9  MG 2.7* 2.6* 2.5*  --   --   --   --    < > =  values in this interval not displayed.   Recent Labs    08/03/24 1200 08/04/24 0246 09/08/24 0656  AST 26 25 28   ALT 15 15 32  ALKPHOS 87 83 137*  BILITOT 1.0 1.0 0.5  PROT 6.3* 6.2* 6.8  ALBUMIN  2.3* 2.2* 2.8*   Recent Labs    01/20/24 1702 08/03/24 1200 08/04/24 0246 09/07/24 2311 09/07/24 2338 09/12/24 0503 09/13/24 0418 09/14/24 0432  WBC 17.7* 11.8*   < > 26.3*   < > 17.9* 13.3* 13.8*  NEUTROABS 12.9* 7.9*  --  21.4*  --   --   --   --   HGB 14.0 11.6*   < > 14.6   < > 12.3* 12.3* 12.4*  HCT 44.2 35.7*   < > 44.9   < > 39.6 39.0 38.2*  MCV 87.9 88.8   < > 83.0   < > 86.8 86.3 83.2  PLT 345 272   < > 427*   < > 340 304 320   < > = values in this interval  not displayed.   Lab Results  Component Value Date   TSH 1.969 08/03/2024   Lab Results  Component Value Date   HGBA1C 6.2 (H) 08/04/2024   Lab Results  Component Value Date   CHOL 108 10/14/2023   HDL 33 (L) 10/14/2023   LDLCALC 55 10/14/2023   TRIG 113 10/14/2023   CHOLHDL 3.3 10/14/2023    Significant Diagnostic Results in last 30 days:  CT ABDOMEN PELVIS WO CONTRAST Result Date: 09/10/2024 CLINICAL DATA:  Sepsis EXAM: CT ABDOMEN AND PELVIS WITHOUT CONTRAST TECHNIQUE: Multidetector CT imaging of the abdomen and pelvis was performed following the standard protocol without IV contrast. RADIATION DOSE REDUCTION: This exam was performed according to the departmental dose-optimization program which includes automated exposure control, adjustment of the mA and/or kV according to patient size and/or use of iterative reconstruction technique. COMPARISON:  01/20/2024 FINDINGS: Lower chest: Bilateral lower lobe interstitial thickening with architectural distortion and volume loss. Trace left pleural fluid or thickening. Mild cardiomegaly with prior TAVR. Median sternotomy with native coronary artery calcification. Pacer/ICD. Hepatobiliary: Normal liver. Normal gallbladder, without biliary ductal dilatation. Pancreas: Normal, without mass or ductal dilatation. Spleen: Normal in size, without focal abnormality. Adrenals/Urinary Tract: Normal adrenal glands. Mild renal cortical thinning bilaterally. No renal calculi or hydronephrosis. No hydroureter or ureteric calculi. No bladder calculi. Stomach/Bowel: Tiny hiatal hernia. Colonic stool burden suggests constipation. Normal terminal ileum. Normal small bowel. Vascular/Lymphatic: Advanced aortic and branch vessel atherosclerosis. No abdominopelvic adenopathy. Reproductive: Normal prostate. Other: No free intraperitoneal air.  No significant free fluid. Musculoskeletal: Advanced lumbosacral spondylosis. IMPRESSION: 1. No acute process in the abdomen or  pelvis. 2. Possible constipation. 3.  Aortic Atherosclerosis (ICD10-I70.0). Electronically Signed   By: Rockey Kilts M.D.   On: 09/10/2024 15:43   DG Chest 2 View Result Date: 09/07/2024 EXAM: 2 VIEW(S) XRAY OF THE CHEST 09/07/2024 05:12:00 PM COMPARISON: 08/30/2024 CLINICAL HISTORY: FINDINGS: LUNGS AND PLEURA: No focal pulmonary opacity. No pleural effusion. No pneumothorax. HEART AND MEDIASTINUM: Heart size at upper limits. Stable left AICD. Aortic valve noted. Aortic atherosclerosis (ICD10-170.0). BONES AND SOFT TISSUES: Sternotomy wires noted. Vertebral endplate spurring at multiple levels in lower thoracic spine. IMPRESSION: 1. No acute findings. 2. Postsurgical changes of prior sternotomy with aortic valve replacement and stable left AICD. 3. Aortic atherosclerosis. 4. Heart size at the upper limits of normal. 5. Chronic vertebral endplate spurring in the lower thoracic spine. Electronically signed by: Dorethia Molt  MD 09/07/2024 05:21 PM EST RP Workstation: [id number]   CUP PACEART REMOTE DEVICE CHECK Result Date: 09/01/2024 ICD: Scheduled remote reviewed. Normal device function.  Presenting rhythm: AF/BiVP Permanent AF, Pradaxa , good V-rate control Next remote transmission per protocol. ML, CVRS  DG Chest 2 View Result Date: 08/30/2024 CLINICAL DATA:  Chest congestion. EXAM: CHEST - 2 VIEW COMPARISON:  08/03/2024 and CT chest 01/27/2024. FINDINGS: Trachea is midline. Heart is enlarged, stable. Pacemaker and ICD lead tips are stable in position. Aortic valve replacement. Biapical pleural thickening. Mild mid and lower lung zone scarring, as on CT chest 01/27/2024. No airspace consolidation. Probable small chronic bilateral pleural effusions. IMPRESSION: 1. Probable small chronic bilateral pleural effusions. 2. Otherwise, no acute findings. Electronically Signed   By: Newell Eke M.D.   On: 08/30/2024 13:09    Assessment/Plan  1. UTI due to extended-spectrum beta lactamase (ESBL) producing  Escherichia coli (Primary) -  completed Meropenem  for a total of 5 days. CT abdomen/pelvis showed no pyelonephritis. He was discharged with Invanz  for 2 more days. WBC trended down from 26K to 13.8.  2. Acute renal failure superimposed on stage 3a chronic kidney disease, unspecified acute renal failure type (HCC) -  Held diuretics and blood pressure medications.  3. Type 2 diabetes mellitus with other specified complication, without long-term current use of insulin  (HCC) Lab Results  Component Value Date   HGBA1C 6.2 (H) 08/04/2024    -  diet-controlled  4. Chronic systolic CHF (congestive heart failure) (HCC) -  no shortness of breath  -  Beta blocker, Aldactone , Torsemide  on hold due to hypotension -  will follow up with cardiology today, 09/15/24  5. Persistent atrial fibrillation (HCC) -  HR 92 -  continue Pradaxa  150 mg BID -  Continue Amiodarone  200 mg daily -  Beta blocker on hold due to hypotension -  will follow up with cardiology  6. Other iron deficiency anemia Lab Results  Component Value Date   HGB 12.4 (L) 09/14/2024    -  continue FeSO4 325 mg daily  7. OSA on CPAP -  continue CPAP at bedtime  8. Gastroesophageal reflux disease without esophagitis -  continue Reglan  10 mg BID -  continue Pantoprazole  40 mg BID    Family/ staff Communication: Discussed plan of care with resident and charge nurse.  Labs/tests ordered: None    Suhey Radford Medina-Vargas, DNP, MSN, FNP-BC [facility] [phone] (Monday-Friday 8:00 a.m. - 5:00 p.m.) [phone] (after hours)      [1]  Allergies Allergen Reactions   Peanut (Diagnostic) Anaphylaxis   Peanut-Containing Drug Products Anaphylaxis   Sulfa Antibiotics Anaphylaxis   Sulfonamide Derivatives Anaphylaxis   Eliquis  [Apixaban ] Other (See Comments)    Back/hip pain   Norvasc  [Amlodipine ] Swelling    *lower extremity swelling   Xarelto  [Rivaroxaban ] Other (See Comments)    Back/hip  pain   Zestril  [Lisinopril ] Cough   Lactose Other (See Comments)    Unknown reaction   "

## 2024-09-15 NOTE — Addendum Note (Signed)
 Addended by: PHYLLIS MUTTER C on: 09/15/2024 12:19 PM   Modules accepted: Level of Service

## 2024-09-16 ENCOUNTER — Telehealth: Payer: Self-pay

## 2024-09-16 ENCOUNTER — Telehealth (HOSPITAL_COMMUNITY): Payer: Self-pay

## 2024-09-16 LAB — LAB REPORT - SCANNED: EGFR: 55

## 2024-09-16 NOTE — Telephone Encounter (Signed)
 Received call from daughter Quinntin Malter, per [facility].  Pt sent a remote transmission to check fluid levels. He was discharged from hospital on 09/14/2024 and had HF clinic office visit on 09/15/2024.  She stated patient is very listless and does not seem to be recovering.  He has been taking antibiotic for UTI and thinks maybe he is not feeling well from that aspect.  He has returned to [facility] which is where he resides.    She will have him transported back to the hospital if he gets worse.   Optivol thoracic impedance has returned to normal after being treated for dehydration in the hospital.

## 2024-09-16 NOTE — Telephone Encounter (Addendum)
 Patient daughter called in stating patient is claiming to not feel well very fatigue plus loss of appetite completely different than he was from yesterday at his office visit, she is concerned about what to do being she does not want to be trying to get him to the ED over the weekend during a winter storm. He is getting a CBC and BMET at [facility] and she will call when results come in his vitals  were Weight- 264.8 lbs  BP -152/87 Temp - 97.3  HR -97 Resp- 20 O2 - 97%

## 2024-09-17 ENCOUNTER — Encounter (HOSPITAL_COMMUNITY): Payer: Self-pay

## 2024-09-17 ENCOUNTER — Ambulatory Visit (HOSPITAL_COMMUNITY): Payer: Self-pay | Admitting: Family Medicine

## 2024-09-17 NOTE — Telephone Encounter (Signed)
 Fax sent over with new orders and request for labs daughter also contacted just to be aware of changes made.

## 2024-09-20 ENCOUNTER — Ambulatory Visit

## 2024-09-20 ENCOUNTER — Telehealth (HOSPITAL_COMMUNITY): Payer: Self-pay | Admitting: Cardiology

## 2024-09-20 ENCOUNTER — Telehealth: Payer: Self-pay

## 2024-09-20 ENCOUNTER — Telehealth: Payer: Self-pay | Admitting: Home Health

## 2024-09-20 DIAGNOSIS — Z95810 Presence of automatic (implantable) cardiac defibrillator: Secondary | ICD-10-CM

## 2024-09-20 DIAGNOSIS — I5022 Chronic systolic (congestive) heart failure: Secondary | ICD-10-CM

## 2024-09-20 MED ORDER — TORSEMIDE 100 MG PO TABS: 100.0000 mg | ORAL_TABLET | Freq: Every day | ORAL | 6 refills | Status: AC

## 2024-09-20 MED ORDER — POTASSIUM CHLORIDE CRYS ER 20 MEQ PO TBCR: 80.0000 meq | EXTENDED_RELEASE_TABLET | Freq: Every day | ORAL | 6 refills | Status: AC

## 2024-09-20 NOTE — Telephone Encounter (Signed)
 Received notification from [facility] on-call NP that patient's daughter was concerned about fluid build up.   iCM monitoring from today reviewed, showing possible fluid accumulation and thoracic impedence.  Called daughter to discuss. Will increase torsemide  to 100 mg daily and KCL to 80 mg daily.  Patient is currently residing at [facility].  Called and changes relayed to Tamika, RN and new prescriptions sent to [facility].  We will re-evaluate Optivol and labs at next appointment 09/23/24.

## 2024-09-20 NOTE — Telephone Encounter (Signed)
.  Attempted call to daughter [name] Genao per DPR and unable to reach.  Left message for return call.

## 2024-09-20 NOTE — Telephone Encounter (Signed)
 Forwarded page to [facility] team Jama NP for call back to patient's daughter Holley regarding concern of fluid build up

## 2024-09-20 NOTE — Progress Notes (Signed)
 EPIC Encounter for ICM Monitoring  Patient Name: Justin [name] is a 89 y.o. male Date: 09/20/2024 Primary Care Physican: Mast, Man X, NP Primary Cardiologist: Justin [name] (HF Clinic) Electrophysiologist:  Justin [name] Pacing: 66.49%  09/27/2022 Weight: 270 lbs 02/12/2023 Weight: 261 lbs 07/07/2023 Office Weight: 261 lbs 10/07/2023 Weight: 245-250 lbs 10/24/2023 Office Weight: 252 lbs 03/01/2024 Office Weight: 252 lbs 03/17/2024 Office Weight: 253 lbs 05/25/2024 Weight: 259 lbs 05/27/2024 Weight: 257 lbs after taking 05/26/2024 Metolazone  07/15/2024 Office Weight: 269.6 lbs 07/16/2024 Office Weight: 263 lbs 09/15/2024 Office Weight: 257 lbs   Since 16-Sep-2024 Time in AT/AF  24.0 hr/day (100.0%)   Spoke with patient and heart failure questions reviewed.  Transmission results reviewed.  He denies any fluid symptoms and states he is feeling fine.   Also spoke with daughter Justin [name], per [facility].  She feels like patient is doing okay at his time and he has not expressed any fluid symptoms yet.     Was admitted to hospital 09/07/2024 and discharged 09/14/2024.  Had HF clinic visit 09/15/2024.   Diet: Restricting salt intake at facility   Since 09/14/2024  ICM Remote Transmission (see phone note): Optivol thoracic impedance suggesting possible fluid accumulation 09/17/2024.    Prescribed: Torsemide  20 mg take 2 tablets (40 mg total) by mouth daily  (Per 09/15/2024 Office note reads he will likely require Torsemide  100 mg + 1-2 [name] Metolazone  a week to keep volume stable but was not ordered in EPIC.   Also Justin Gainer, NP discussed with daughter using PRN Furosix at [facility]). Potassium 20 mEq take 2 tablet(s) (40 mEq total) by mouth twice a day. Additional 40 mEq with each Metolazone  dose.     Metolazone  5 mg - no currently on med list   Labs: 09/14/2024 Creatinine 1.50, BUN 28, Potassium 4.2, Sodium 133, GFR 45  09/13/2024 Creatinine 1.52, BUN 34, Potassium 4.3, Sodium 133, GFR  44  09/12/2024 Creatinine 1.72, BUN 40, Potassium 5.2, Sodium 132, GFR 38  09/11/2024 Creatinine 1.65, BUN 42, Potassium 3.8, Sodium 136, GFR 40  09/10/2024 Creatinine 1.91, BUN 53, Potassium 3.1, Sodium 132, GFR 33  A complete set of results can be found in Results Review.   Recommendations: Advised will send copy to [facility], NP but explained the office is closed today due to inclement weather so any recommendations will not be given until office reopens.  Advised daughter she can call HF clinic to speak with on call person if needed before the office reopens for business or use ER if needed.    Follow-up plan: ICM clinic phone appointment on 10/04/2024.  (Will have fluid levels checked on 09/23/2024 at HF clinic).  91 day device clinic remote transmission 11/30/2024.     EP/Cardiology Office Visits:   09/23/2024 with HF clinic.  10/27/2024 with Dr Justin.   Copy of ICM check sent to Dr Justin.      Remote monitoring is medically necessary for Heart Failure Management.    Daily Thoracic Impedance ICM trend: 06/20/2024 through 09/19/2024.    12-14 Month Thoracic Impedance ICM trend:     Justin GORMAN Garner, RN 09/20/2024 9:53 AM

## 2024-09-21 NOTE — Progress Notes (Signed)
 Daughter Justin [name] spoke with HF clinic on call Justin Lee NP on 09/20/2024.  Recommendation to increase Torsemide  to 100 mg daily and KCL to 80 mg daily.    Justin Gainer, NP at [facility] clinic requested remote transmission to check fluid levels in a week.  ICM Remote transmission scheduled for 09/29/2024.

## 2024-09-22 ENCOUNTER — Telehealth (HOSPITAL_COMMUNITY): Payer: Self-pay

## 2024-09-22 NOTE — Telephone Encounter (Signed)
 Called to confirm/remind patient of their appointment at the [facility] on 09/23/24.   Appointment:   [x] Confirmed  [] Left mess   [] No answer/No voice mail  [] VM Full/unable to leave message  [] Phone not in service  Patient reminded to bring all medications and/or complete list.  Confirmed patient has transportation. Gave directions, instructed to utilize valet parking.

## 2024-09-22 NOTE — Progress Notes (Signed)
 Appt was canceled

## 2024-09-23 ENCOUNTER — Emergency Department (HOSPITAL_COMMUNITY)

## 2024-09-23 ENCOUNTER — Ambulatory Visit (HOSPITAL_COMMUNITY): Admission: RE | Admit: 2024-09-23 | Discharge: 2024-09-23 | Disposition: A | Source: Ambulatory Visit

## 2024-09-23 ENCOUNTER — Encounter (HOSPITAL_COMMUNITY): Payer: Self-pay | Admitting: Emergency Medicine

## 2024-09-23 ENCOUNTER — Other Ambulatory Visit: Payer: Self-pay

## 2024-09-23 ENCOUNTER — Inpatient Hospital Stay (HOSPITAL_COMMUNITY)
Admission: EM | Admit: 2024-09-23 | Source: Home / Self Care | Attending: Internal Medicine | Admitting: Internal Medicine

## 2024-09-23 ENCOUNTER — Telehealth: Payer: Self-pay

## 2024-09-23 DIAGNOSIS — N401 Enlarged prostate with lower urinary tract symptoms: Secondary | ICD-10-CM | POA: Diagnosis not present

## 2024-09-23 DIAGNOSIS — E1169 Type 2 diabetes mellitus with other specified complication: Secondary | ICD-10-CM

## 2024-09-23 DIAGNOSIS — R7989 Other specified abnormal findings of blood chemistry: Secondary | ICD-10-CM | POA: Diagnosis not present

## 2024-09-23 DIAGNOSIS — E876 Hypokalemia: Secondary | ICD-10-CM | POA: Diagnosis not present

## 2024-09-23 DIAGNOSIS — E119 Type 2 diabetes mellitus without complications: Secondary | ICD-10-CM

## 2024-09-23 DIAGNOSIS — N138 Other obstructive and reflux uropathy: Secondary | ICD-10-CM | POA: Diagnosis present

## 2024-09-23 DIAGNOSIS — R296 Repeated falls: Secondary | ICD-10-CM | POA: Diagnosis not present

## 2024-09-23 DIAGNOSIS — Z952 Presence of prosthetic heart valve: Secondary | ICD-10-CM | POA: Diagnosis not present

## 2024-09-23 DIAGNOSIS — E039 Hypothyroidism, unspecified: Secondary | ICD-10-CM | POA: Diagnosis present

## 2024-09-23 DIAGNOSIS — R531 Weakness: Secondary | ICD-10-CM | POA: Diagnosis not present

## 2024-09-23 DIAGNOSIS — N1831 Chronic kidney disease, stage 3a: Secondary | ICD-10-CM | POA: Diagnosis present

## 2024-09-23 DIAGNOSIS — J449 Chronic obstructive pulmonary disease, unspecified: Secondary | ICD-10-CM | POA: Diagnosis present

## 2024-09-23 DIAGNOSIS — I10 Essential (primary) hypertension: Secondary | ICD-10-CM | POA: Diagnosis present

## 2024-09-23 DIAGNOSIS — J690 Pneumonitis due to inhalation of food and vomit: Secondary | ICD-10-CM | POA: Diagnosis not present

## 2024-09-23 DIAGNOSIS — I5043 Acute on chronic combined systolic (congestive) and diastolic (congestive) heart failure: Secondary | ICD-10-CM | POA: Diagnosis not present

## 2024-09-23 DIAGNOSIS — I484 Atypical atrial flutter: Secondary | ICD-10-CM | POA: Diagnosis not present

## 2024-09-23 DIAGNOSIS — Z95810 Presence of automatic (implantable) cardiac defibrillator: Secondary | ICD-10-CM | POA: Diagnosis present

## 2024-09-23 DIAGNOSIS — I4729 Other ventricular tachycardia: Secondary | ICD-10-CM | POA: Diagnosis present

## 2024-09-23 DIAGNOSIS — I5042 Chronic combined systolic (congestive) and diastolic (congestive) heart failure: Secondary | ICD-10-CM

## 2024-09-23 DIAGNOSIS — R42 Dizziness and giddiness: Secondary | ICD-10-CM

## 2024-09-23 DIAGNOSIS — E785 Hyperlipidemia, unspecified: Secondary | ICD-10-CM | POA: Diagnosis present

## 2024-09-23 DIAGNOSIS — R9431 Abnormal electrocardiogram [ECG] [EKG]: Secondary | ICD-10-CM | POA: Diagnosis present

## 2024-09-23 DIAGNOSIS — I25810 Atherosclerosis of coronary artery bypass graft(s) without angina pectoris: Secondary | ICD-10-CM | POA: Diagnosis present

## 2024-09-23 LAB — COMPREHENSIVE METABOLIC PANEL WITH GFR
ALT: 22 U/L (ref 0–44)
AST: 40 U/L (ref 15–41)
Albumin: 2.9 g/dL — ABNORMAL LOW (ref 3.5–5.0)
Alkaline Phosphatase: 180 U/L — ABNORMAL HIGH (ref 38–126)
Anion gap: 11 (ref 5–15)
BUN: 20 mg/dL (ref 8–23)
CO2: 31 mmol/L (ref 22–32)
Calcium: 8.4 mg/dL — ABNORMAL LOW (ref 8.9–10.3)
Chloride: 99 mmol/L (ref 98–111)
Creatinine, Ser: 1.33 mg/dL — ABNORMAL HIGH (ref 0.61–1.24)
GFR, Estimated: 51 mL/min — ABNORMAL LOW
Glucose, Bld: 102 mg/dL — ABNORMAL HIGH (ref 70–99)
Potassium: 3.4 mmol/L — ABNORMAL LOW (ref 3.5–5.1)
Sodium: 141 mmol/L (ref 135–145)
Total Bilirubin: 0.7 mg/dL (ref 0.0–1.2)
Total Protein: 6.6 g/dL (ref 6.5–8.1)

## 2024-09-23 LAB — I-STAT VENOUS BLOOD GAS, ED
Acid-Base Excess: 5 mmol/L — ABNORMAL HIGH (ref 0.0–2.0)
Bicarbonate: 31.9 mmol/L — ABNORMAL HIGH (ref 20.0–28.0)
Calcium, Ion: 1.09 mmol/L — ABNORMAL LOW (ref 1.15–1.40)
HCT: 35 % — ABNORMAL LOW (ref 39.0–52.0)
Hemoglobin: 11.9 g/dL — ABNORMAL LOW (ref 13.0–17.0)
O2 Saturation: 77 %
Potassium: 3.3 mmol/L — ABNORMAL LOW (ref 3.5–5.1)
Sodium: 141 mmol/L (ref 135–145)
TCO2: 34 mmol/L — ABNORMAL HIGH (ref 22–32)
pCO2, Ven: 57.4 mmHg (ref 44–60)
pH, Ven: 7.353 (ref 7.25–7.43)
pO2, Ven: 45 mmHg (ref 32–45)

## 2024-09-23 LAB — URINALYSIS, W/ REFLEX TO CULTURE (INFECTION SUSPECTED)
Bilirubin Urine: NEGATIVE
Glucose, UA: NEGATIVE mg/dL
Hgb urine dipstick: NEGATIVE
Ketones, ur: NEGATIVE mg/dL
Leukocytes,Ua: NEGATIVE
Nitrite: NEGATIVE
Protein, ur: NEGATIVE mg/dL
Specific Gravity, Urine: 1.005 (ref 1.005–1.030)
pH: 5 (ref 5.0–8.0)

## 2024-09-23 LAB — CBC WITH DIFFERENTIAL/PLATELET
Abs Immature Granulocytes: 0.04 10*3/uL (ref 0.00–0.07)
Basophils Absolute: 0.1 10*3/uL (ref 0.0–0.1)
Basophils Relative: 1 %
Eosinophils Absolute: 0.5 10*3/uL (ref 0.0–0.5)
Eosinophils Relative: 4 %
HCT: 35.1 % — ABNORMAL LOW (ref 39.0–52.0)
Hemoglobin: 11 g/dL — ABNORMAL LOW (ref 13.0–17.0)
Immature Granulocytes: 0 %
Lymphocytes Relative: 17 %
Lymphs Abs: 1.8 10*3/uL (ref 0.7–4.0)
MCH: 27 pg (ref 26.0–34.0)
MCHC: 31.3 g/dL (ref 30.0–36.0)
MCV: 86 fL (ref 80.0–100.0)
Monocytes Absolute: 1.2 10*3/uL — ABNORMAL HIGH (ref 0.1–1.0)
Monocytes Relative: 12 %
Neutro Abs: 6.7 10*3/uL (ref 1.7–7.7)
Neutrophils Relative %: 66 %
Platelets: 312 10*3/uL (ref 150–400)
RBC: 4.08 MIL/uL — ABNORMAL LOW (ref 4.22–5.81)
RDW: 18.2 % — ABNORMAL HIGH (ref 11.5–15.5)
WBC: 10.3 10*3/uL (ref 4.0–10.5)
nRBC: 0 % (ref 0.0–0.2)

## 2024-09-23 LAB — I-STAT CHEM 8, ED
BUN: 21 mg/dL (ref 8–23)
Calcium, Ion: 1.06 mmol/L — ABNORMAL LOW (ref 1.15–1.40)
Chloride: 97 mmol/L — ABNORMAL LOW (ref 98–111)
Creatinine, Ser: 1.4 mg/dL — ABNORMAL HIGH (ref 0.61–1.24)
Glucose, Bld: 101 mg/dL — ABNORMAL HIGH (ref 70–99)
HCT: 35 % — ABNORMAL LOW (ref 39.0–52.0)
Hemoglobin: 11.9 g/dL — ABNORMAL LOW (ref 13.0–17.0)
Potassium: 3.3 mmol/L — ABNORMAL LOW (ref 3.5–5.1)
Sodium: 141 mmol/L (ref 135–145)
TCO2: 30 mmol/L (ref 22–32)

## 2024-09-23 LAB — I-STAT CG4 LACTIC ACID, ED
Lactic Acid, Venous: 2.4 mmol/L (ref 0.5–1.9)
Lactic Acid, Venous: 2.1 mmol/L (ref 0.5–1.9)

## 2024-09-23 LAB — PRO BRAIN NATRIURETIC PEPTIDE: Pro Brain Natriuretic Peptide: 3327 pg/mL — ABNORMAL HIGH

## 2024-09-23 LAB — CBG MONITORING, ED: Glucose-Capillary: 96 mg/dL (ref 70–99)

## 2024-09-23 LAB — TROPONIN T, HIGH SENSITIVITY
Troponin T High Sensitivity: 43 ng/L — ABNORMAL HIGH (ref 0–19)
Troponin T High Sensitivity: 43 ng/L — ABNORMAL HIGH (ref 0–19)

## 2024-09-23 LAB — LACTIC ACID, PLASMA: Lactic Acid, Venous: 2.2 mmol/L (ref 0.5–1.9)

## 2024-09-23 LAB — MAGNESIUM: Magnesium: 1.9 mg/dL (ref 1.7–2.4)

## 2024-09-23 LAB — PROCALCITONIN: Procalcitonin: <0.1 ng/mL

## 2024-09-23 MED ORDER — POTASSIUM CHLORIDE CRYS ER 20 MEQ PO TBCR
20.0000 meq | EXTENDED_RELEASE_TABLET | Freq: Once | ORAL | Status: AC
  Administered 2024-09-23: 20 meq via ORAL
  Filled 2024-09-23: qty 1

## 2024-09-23 MED ORDER — DOXYCYCLINE HYCLATE 100 MG PO TABS
100.0000 mg | ORAL_TABLET | Freq: Two times a day (BID) | ORAL | Status: DC
  Administered 2024-09-23: 100 mg via ORAL
  Filled 2024-09-23: qty 1

## 2024-09-23 MED ORDER — SODIUM CHLORIDE 0.9 % IV SOLN
3.0000 g | Freq: Three times a day (TID) | INTRAVENOUS | Status: DC
  Administered 2024-09-23: 3 g via INTRAVENOUS
  Filled 2024-09-23: qty 8

## 2024-09-23 MED ORDER — METOPROLOL TARTRATE 12.5 MG HALF TABLET
12.5000 mg | ORAL_TABLET | Freq: Four times a day (QID) | ORAL | Status: DC
  Administered 2024-09-24: 12.5 mg via ORAL
  Filled 2024-09-23: qty 1

## 2024-09-23 MED ORDER — SODIUM CHLORIDE 0.9 % IV SOLN: 100.0000 mg | Freq: Two times a day (BID) | INTRAVENOUS | Status: DC

## 2024-09-23 MED ORDER — FUROSEMIDE 10 MG/ML IJ SOLN
80.0000 mg | Freq: Once | INTRAMUSCULAR | Status: AC
  Administered 2024-09-23: 80 mg via INTRAVENOUS
  Filled 2024-09-23: qty 8

## 2024-09-23 MED ORDER — IOHEXOL 350 MG/ML SOLN
75.0000 mL | Freq: Once | INTRAVENOUS | Status: AC | PRN
  Administered 2024-09-23: 75 mL via INTRAVENOUS

## 2024-09-23 MED ORDER — INSULIN ASPART 100 UNIT/ML IJ SOLN
0.0000 [IU] | INTRAMUSCULAR | Status: DC
  Administered 2024-09-24: 2 [IU] via SUBCUTANEOUS
  Administered 2024-09-24 – 2024-09-25 (×4): 1 [IU] via SUBCUTANEOUS
  Administered 2024-09-26: 2 [IU] via SUBCUTANEOUS
  Filled 2024-09-23 (×2): qty 1
  Filled 2024-09-23 (×2): qty 2
  Filled 2024-09-23: qty 1

## 2024-09-23 NOTE — Assessment & Plan Note (Signed)
 Can interrogate tomorrow

## 2024-09-23 NOTE — Telephone Encounter (Signed)
 Returned call to daughter Tymar Polyak, per [facility].  She stated patient is in ED due to he was able to walk to bathroom, looks very pale and generally not doing well since last hospitalization.  She is currently in ED with him.    Optivol thoracic impedance suggesting possible fluid accumulation starting 09/17/2024.

## 2024-09-23 NOTE — ED Notes (Signed)
 Nurse attempted to obtain second set of lab cultures but was unsuccessful

## 2024-09-23 NOTE — TOC CM/SW Note (Signed)
 TOC consult received for d/c planning needs. Per chart, patient resides at the [facility]. Message sent via Epic to confirm. Awaiting response. Patient closely followed by HF clinic. Follow-up to be completed with patient as appropriate.   Merilee Batty, MSN, RN Case Management [phone]

## 2024-09-23 NOTE — Assessment & Plan Note (Signed)
 Chronic stable

## 2024-09-23 NOTE — ED Provider Notes (Addendum)
 Signed out that CTs pending to r/o trauma, and plan to admit to medicine/hospitalists re chf, optivol device showing fluid overload, pt with elevated bnp, orthopnea, swelling.   CTs neg for acute trauma.   Lasix  dose iv.   Hospitalists consulted for admission. Discussed pt - they will admit. Hosp requests cardiology consult. Cardiology consulted - discussed pt, they will see in consult, for now indicate hospitalist admission, diuresis.     Bernard Drivers, MD 09/23/24 1859

## 2024-09-23 NOTE — Assessment & Plan Note (Signed)
 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

## 2024-09-23 NOTE — ED Provider Notes (Signed)
 " [place] EMERGENCY DEPARTMENT AT [facility] Provider Note   CSN: [id number] Arrival date & time: 09/23/24  1130     Patient presents with: Fall, Weakness, and Shortness of Breath   Justin [name] is a 89 y.o. male.   HPI 89 year old male presents with weakness and falls.  The patient has been having leg weakness and his legs are giving out from him even with his walker over the last few days.  He has lost strength in his legs and fallen about 4 different times over the last 3 days.  Today when it happened he also noted to have some mild hypoxia and was put on oxygen.  His monitoring system has indicated he might have increased fluid as well.  He is been feeling some increased fluid in his abdomen and does not accumulate fluid in his legs.  Started up with his right leg being weak from his knee and now both legs are weak but his right is worse.  Daughter, who provides most of the history, indicates he is had a little bit of confusion and a little bit of increased fatigue.  No fevers, cough, chest pain, shortness of breath, abdominal pain or vomiting/diarrhea.  He has been urinating less and his diuretic dosage has been adjusted as an outpatient.  Initially, there was indication of no injuries from these falls but on exam, the patient tells me that he hit his head yesterday and he also injured his right upper back and there is some bruising noted.  No injuries from the fall today.  Patient has numerous comorbidities including CABG, TAVR, CAD, CHF, hypertension, paroxysmal A-fib on Pradaxa , and other comorbidities.  Prior to Admission medications  Medication Sig Start Date End Date Taking? Authorizing Provider  albuterol  (VENTOLIN  HFA) 108 (90 Base) MCG/ACT inhaler Inhale 2 puffs into the lungs every 4 (four) hours as needed for wheezing or shortness of breath. 05/06/23  Yes [name], MD  allopurinol  (ZYLOPRIM ) 100 MG tablet Take 1 tablet (100 mg total) by mouth daily. 09/18/23   Yes [name], MD  amiodarone  (PACERONE ) 200 MG tablet Take 1 tablet (200 mg total) by mouth daily. 01/14/24  Yes [name], MD  aspirin  81 MG chewable tablet Chew 81 mg by mouth daily.   Yes [provider]  atorvastatin  (LIPITOR) 20 MG tablet Take 20 mg by mouth daily.   Yes [provider]  benzonatate  (TESSALON ) 100 MG capsule Take 100 mg by mouth 3 (three) times daily.   Yes [provider]  cetirizine (ZYRTEC) 5 MG tablet Take 5 mg by mouth daily.   Yes [provider]  dabigatran  (PRADAXA ) 150 MG CAPS capsule TAKE (1) CAPSULE TWICE DAILY. Patient taking differently: Take 150 mg by mouth 2 (two) times daily. 10/24/23  Yes [name], MD  diclofenac  Sodium (VOLTAREN ) 1 % GEL Apply 4 g topically 3 (three) times daily as needed (pain). 01/26/24  Yes [name], MD  docusate sodium  (COLACE) 100 MG capsule Take 100 mg by mouth in the morning and at bedtime.   Yes [provider]  ferrous sulfate  325 (65 FE) MG tablet Take 1 tablet (325 mg total) by mouth daily with breakfast. 09/15/24  Yes Darci Pore, MD  fluticasone  (FLONASE ) 50 MCG/ACT nasal spray Place 2 sprays into both nostrils daily. 12/26/23  Yes [provider]  levothyroxine  (SYNTHROID ) 175 MCG tablet Take 175 mcg by mouth daily before breakfast.   Yes [provider]  metoCLOPramide  (REGLAN ) 10 MG tablet Take 10 mg by mouth 2 (two) times daily before a meal.   Yes [provider]  metoprolol  succinate (TOPROL  XL) 25 MG 24 hr tablet Take 1 tablet (25 mg total) by mouth at bedtime. 09/15/24  Yes [name], [name], FNP  montelukast  (SINGULAIR ) 10 MG tablet TAKE ONE TABLET BY MOUTH ONCE DAILY IN THE EVENING 08/20/22  Yes Hunsucker, Donnice SAUNDERS, MD  Multiple Vitamins-Minerals (MULTIVITAMIN GUMMIES MENS) CHEW Chew 2 each by mouth daily.   Yes [provider]  oxyCODONE  (OXY IR/ROXICODONE ) 5 MG immediate release tablet Take 5 mg by  mouth every 8 (eight) hours as needed (knee pain).   Yes [provider]  pantoprazole  (PROTONIX ) 40 MG tablet Take 40 mg by mouth in the morning and at bedtime.   Yes [provider]  potassium chloride  SA (KLOR-CON  M) 20 MEQ tablet Take 4 tablets (80 mEq total) by mouth daily. 09/20/24  Yes Lee, Jordan, NP  tamsulosin  (FLOMAX ) 0.4 MG CAPS capsule Take 0.4 mg by mouth at bedtime.   Yes [provider]  Tiotropium Bromide  Monohydrate (SPIRIVA  RESPIMAT) 2.5 MCG/ACT AERS Inhale 2 puffs into the lungs daily. 02/17/24  Yes Hope Almarie ORN, NP  torsemide  (DEMADEX ) 100 MG tablet Take 1 tablet (100 mg total) by mouth daily. 09/20/24  Yes Lee, Jordan, NP  triamcinolone  cream (KENALOG ) 0.5 % Apply 1 Application topically as directed. Patient taking differently: Apply 1 Application topically 2 (two) times daily. Apply to back 08/06/24  Yes [provider]  venlafaxine  XR (EFFEXOR -XR) 37.5 MG 24 hr capsule Take 37.5 mg by mouth daily.   Yes [provider]  ertapenem  (INVANZ ) IVPB Inject 1 g into the vein in the morning and at bedtime. Patient not taking: Reported on 09/23/2024    [provider]  OXYGEN Inhale 2 L/min into the lungs continuous.    [provider]  predniSONE  (DELTASONE ) 20 MG tablet Take 40 mg by mouth daily with breakfast. Patient not taking: Reported on 09/23/2024    [provider]  UNABLE TO FIND Inhale 1 Device into the lungs at bedtime. CPAP; Apply CPAP mask at bedtime.    [provider]    Allergies: Peanut (diagnostic), Peanut-containing drug products, Sulfa antibiotics, Sulfonamide derivatives, Eliquis  [apixaban ], Norvasc  [amlodipine ], Xarelto  [rivaroxaban ], Zestril  [lisinopril ], and Lactose    Review of Systems  Constitutional:  Positive for fatigue. Negative for fever.  Respiratory:  Negative for cough and shortness of breath.   Cardiovascular:  Negative for chest pain.  Gastrointestinal:   Positive for abdominal distention. Negative for abdominal pain.  Genitourinary:  Positive for decreased urine volume.  Musculoskeletal:  Negative for neck pain.  Neurological:  Positive for weakness. Negative for headaches.    Updated Vital Signs BP (!) 108/59   Pulse 85   Temp 98.4 F (36.9 C) (Oral)   Resp 16   SpO2 100%   Physical Exam Vitals and nursing note reviewed.  Constitutional:      General: He is not in acute distress.    Appearance: He is well-developed. He is obese. He is not ill-appearing or diaphoretic.  HENT:     Head: Normocephalic and atraumatic.  Cardiovascular:     Rate and Rhythm: Normal rate and regular rhythm.     Pulses:          Dorsalis pedis pulses are 2+ on the right side and 2+ on the left side.     Heart sounds:  Normal heart sounds.  Pulmonary:     Effort: Pulmonary effort is normal.     Breath sounds: Normal breath sounds. No wheezing.  Abdominal:     General: There is no distension.     Palpations: Abdomen is soft.     Tenderness: There is no abdominal tenderness.  Musculoskeletal:     Cervical back: No tenderness. No spinous process tenderness or muscular tenderness.     Thoracic back: No tenderness.     Lumbar back: No tenderness.       Back:     Right knee: Swelling (mild) present. No deformity, erythema or ecchymosis. Decreased range of motion (mild). Tenderness present.  Skin:    General: Skin is warm and dry.  Neurological:     Mental Status: He is alert.     Comments: Awake, alert, no facial droop.  5/5 strength in both upper extremities.  4/5 strength in both lower extremities.     (all labs ordered are listed, but only abnormal results are displayed) Labs Reviewed  URINALYSIS, W/ REFLEX TO CULTURE (INFECTION SUSPECTED) - Abnormal; Notable for the following components:      Result Value   Color, Urine STRAW (*)    Bacteria, UA RARE (*)    All other components within normal limits  COMPREHENSIVE METABOLIC PANEL WITH GFR -  Abnormal; Notable for the following components:   Potassium 3.4 (*)    Glucose, Bld 102 (*)    Creatinine, Ser 1.33 (*)    Calcium  8.4 (*)    Albumin  2.9 (*)    Alkaline Phosphatase 180 (*)    GFR, Estimated 51 (*)    All other components within normal limits  PRO BRAIN NATRIURETIC PEPTIDE - Abnormal; Notable for the following components:   Pro Brain Natriuretic Peptide 3,327.0 (*)    All other components within normal limits  CBC WITH DIFFERENTIAL/PLATELET - Abnormal; Notable for the following components:   RBC 4.08 (*)    Hemoglobin 11.0 (*)    HCT 35.1 (*)    RDW 18.2 (*)    Monocytes Absolute 1.2 (*)    All other components within normal limits  I-STAT CHEM 8, ED - Abnormal; Notable for the following components:   Potassium 3.3 (*)    Chloride 97 (*)    Creatinine, Ser 1.40 (*)    Glucose, Bld 101 (*)    Calcium , Ion 1.06 (*)    Hemoglobin 11.9 (*)    HCT 35.0 (*)    All other components within normal limits  I-STAT CG4 LACTIC ACID, ED - Abnormal; Notable for the following components:   Lactic Acid, Venous 2.4 (*)    All other components within normal limits  I-STAT VENOUS BLOOD GAS, ED - Abnormal; Notable for the following components:   Bicarbonate 31.9 (*)    TCO2 34 (*)    Acid-Base Excess 5.0 (*)    Potassium 3.3 (*)    Calcium , Ion 1.09 (*)    HCT 35.0 (*)    Hemoglobin 11.9 (*)    All other components within normal limits  TROPONIN T, HIGH SENSITIVITY - Abnormal; Notable for the following components:   Troponin T High Sensitivity 43 (*)    All other components within normal limits  CULTURE, BLOOD (ROUTINE X 2)  CULTURE, BLOOD (ROUTINE X 2)  MAGNESIUM   I-STAT CG4 LACTIC ACID, ED  TROPONIN T, HIGH SENSITIVITY    EKG: EKG Interpretation Date/Time:  Thursday September 23 2024 14:08:22 EST Ventricular Rate:  39  PR Interval:    QRS Duration:  154 QT Interval:  488 QTC Calculation: 563 R Axis:   260  Text Interpretation: Atrial fibrillation Ventricular  premature complex Right bundle branch block Anterior infarct, old Confirmed by Freddi Hamilton [phone]) on 09/23/2024 2:40:33 PM  Radiology: [facility]COLA Knee Complete 4 Views Right Result Date: 09/23/2024 EXAM: 4 VIEW(S) XRAY OF THE RIGHT KNEE 09/23/2024 01:53:00 PM COMPARISON: None available. CLINICAL HISTORY: Right knee pain and swelling. 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 Resurfaced patella with increased sclerosis and stable inferior pole patchy lucency with increased spur formation and possible increased heterotopic ossification along the proximal patellar tendon. Small joint effusion. No acute fracture. No malalignment. SOFT TISSUES: Vascular calcifications. Diffuse soft tissue swelling. IMPRESSION: 1. Small joint effusion and diffuse soft tissue swelling. 2. 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 3. Resurfaced patella with increased sclerosis, stable inferior pole patchy lucency, increased spur formation, and possible increased heterotopic ossification along the proximal patellar tendon. 4. Vascular calcifications. Electronically signed by: Ryan Salvage MD 09/23/2024 02:58 PM EST RP Workstation: [id number]   DG Chest Portable 1 View Result Date: 09/23/2024 CLINICAL DATA:  Shortness of breath. EXAM: PORTABLE CHEST 1 VIEW COMPARISON:  Chest radiograph dated 09/07/2024. FINDINGS: Cardiomegaly with vascular congestion. No focal consolidation, pleural effusion or pneumothorax. Left pectoral AICD device. Median sternotomy wires. No acute osseous pathology. IMPRESSION: Cardiomegaly with vascular congestion. Electronically Signed   By: Vanetta Chou M.D.   On: 09/23/2024 14:47     Procedures   Medications Ordered in the ED  iohexol  (OMNIPAQUE ) 350 MG/ML injection 75 mL (75 mLs Intravenous Contrast Given 09/23/24 1529)                                    Medical Decision Making Amount and/or Complexity of Data Reviewed Labs: ordered.    Details: Elevated BNP and  troponin, troponin flat compared to others. Radiology: ordered and independent interpretation performed.    Details: Cardiomegaly ECG/medicine tests: ordered and independent interpretation performed.    Details: Stable compared to baseline  Risk Prescription drug management.   Patient presents with weakness, primarily bilateral leg weakness.  Does have some knee pain and probably a small effusion but no erythema, fever, etc.  Lactate had been obtained given some soft pressures in triage but pressures are now normal.  No signs of infection at this time.  Concerned based on his outpatient CHF testing and presentation that he might actually be more fluid overloaded, will hold on fluids for now.  Will need multiple CTs given that he fell while on a blood thinner and did hit his head (seems minor) and has a bruising to his thoracic back.  Care transferred to Dr. Bernard.     Final diagnoses:  None    ED Discharge Orders     None          Freddi Hamilton, MD 09/23/24 1601  "

## 2024-09-23 NOTE — Assessment & Plan Note (Signed)
Order sliding scale  

## 2024-09-23 NOTE — Assessment & Plan Note (Signed)
 Check bladder scan Continue Flomax  0.4 mg daily

## 2024-09-23 NOTE — Assessment & Plan Note (Addendum)
 CT imaging worrisome for aspiration pneumonia Patient did not endorse any fevers or cough but he has been somewhat more fatigued no white blood cell count noted but he does have lactic acidosis.  For tonight cover with Unasyn  Doxycycline  (prolonged qtc) and order SLP consult check procalcitonin

## 2024-09-23 NOTE — Assessment & Plan Note (Signed)
-   Check TSH continue home medications Synthroid at 12mcg po q day

## 2024-09-23 NOTE — ED Triage Notes (Addendum)
 Pt BIB [facility] from [facility] for mechanical fall on thinners. Went to the restroom using walker, bent over to pick up a clothes hanger and fell  4th. fall this week. A&O x 4. Did not hit head, no LOC, no obvious injuries. Pt mostly bed bound. Pt a little difficult to understand.  156/87 98% (3L at baseline) HR 107, RR 18

## 2024-09-23 NOTE — Subjective & Objective (Signed)
 Patient has been having increased falls last 1 was today when he was in the restroom while using a walker apparently bent over and fell this was his fourth fall this week.  Denies hitting head.  Patient is on blood thinners he does not ambulate much on arrival blood pressure 156/87 satting 98% on 3 L which is his baseline between 2 to 3 L heart rate noted to be at 107.  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 coli ESBL urosepsis resulting in AKI his discharge weight was 249 pounds diuretics had to be stopped when he went back to the clinic follow-up with cardiology on 21st he noted to be fluid overloaded and torsemide  was restarted 40 mg a day as well as his Toprol  was restarted despite that patient continued to retain fluid and torsemide  was increased up to 100 mg daily.  Unfortunately patient continues to be short of breath with exertion.  He has been doing worse since his previous hospitalization. Feels like he is accumulating fluid in his abdomen and legs somewhat confused and more fatigued.  Feels like his legs give out from underneath him.  Patient denies hitting his head during the last fall feels like he mainly fell on the back Family denied any fevers no chest pain no nausea no vomiting  In the ER proBNP 3327 potassium 3.3 creatinine 1.4 the lactic acid 2.4 troponin 40 stable x 2 EKG showing atrial fibrillation Imaging showed no evidence of trauma Patient was given a dose of Lasix 

## 2024-09-23 NOTE — Assessment & Plan Note (Signed)
 Continue Lipitor 20 mg a day.

## 2024-09-23 NOTE — Consult Note (Signed)
 "  Cardiology Consultation   Patient ID: Justin [name] MRN: [id number]; DOB: 01-06-1934  Admit date: 09/23/2024 Date of Consult: 09/23/2024  PCP:  Mast, Man X, NP   [facility] Providers Cardiologist:  [name], MD  Electrophysiologist:  Fonda Kitty, MD       Patient Profile: Justin [name] is a 89 y.o. male with a hx of permanent atrial fibrillation, NSVT, frequent PVCs, aortic stenosis s/p TAVR, Medtronic BiV ICD in 2018 after TAVR, with gen change 05/2023, CKD stage 3, chronic HFrEF, CAD s/p CABG in 2007, COPD, chronic hypoxic respiratory failure on home O2, OSA, iron deficiency, GERD, hyponatremia, UTI, old strokes on CT head who is being seen 09/23/2024 for the evaluation of CHF at the request of Dr. Silvester.  History of Present Illness: Justin [name] follows with Dr. Cash, Dr. Kitty, and CHF team in setting of the above. He had several notable admissions in 2025: 10/2023 (pneumonia and compressive atelectasis 2/2 COVID and flu infection c/b UTI. S/p thoracentesis 1L. Echo showed EF 40-45% with mod reduced RV, normal AoV prosthetic. DC to SNF), 07/2024 (sent from office with BP 65/38, admitted with AKI. GDMT held, SCr 2.56>>1.93. Meds gradually re-started as BP and renal function improved), then more recently 1/13-1/20 with ESBL E Coli urosepsis, complicated by AKI on CKD3 with peak Cr 2.70, hyponatremia, hypokalemia, iron deficiency. He was treated with abx and most GDMT stopped due to AKI. Discharged back to SNF, weight 249 lbs. SGLT2i deferred due to UTI, no RAASi with CKD. He was felt to be unfortunately near end stage, but not yet considering palliative care. He was seen day after discharge with concern he would begin becoming hypervolemic soon. Torsemide  40mg  + Kcl 40meq daily + Toprol  25mg  at bedtime restarted (previously on torsemide  100mg  BID). He was also continued on home O2 with prior PFTs showing interstitial process with mild diffusion defect; DLCO  60% pred. with plan to continue to follow on chronic amiodarone  (on this in setting of frequent PVCs/NSVT; his atrial fib is felt permanent). CHF clinic reviewed optivol and spoke with daughter/facility on 1/26 to increase torsemide  to 100mg  daily and KCl to 80meq daily due to concern of continued volume overload. Of note, EP previously raised question of potentially pursuing AVN ablation if BB titration failed to improve BiV pacing percentage. Last interrogation 1/5 showed 64.7% V pacing.  He presented back to the hospital for mechanical fall, the 4th fall this week. He bent over to pick up a clothes hanger and fell. EMS VS showed HR 107, BP 156/87, POx 98% 3L at baseline though ED BP down to 94/51 on arrival. He is admitted to the medicine service for this. In the interim, telephone notes also indicate Optivol showed continued fluid accumulation since 1/23 therefore cardiology asked to assist with med management. K 3.4, lactate 2.4, Cr 1.33, albumin  2.9, pBNP 3327, troponin 43-43, Hgb 11s. Imaging notable for small right and trace pleural effusions as well as nodular opacities LLL, cannot exclude developing PNA or aspiration, small bilateral chronic cerebellar infarcts. He denies any chest pain. He reports ongoing chronic DOE with minimal activity which he states is pretty much baseline for him. He reports they gave him a super pee pill yesterday at the facility which helped produce UOP; unable to confirm at this time. He denies any CP, palpitations or syncope.   Past Medical History:  Diagnosis Date   Age-related macular degeneration, dry, both eyes    Allergic    24/7;  365 days/year; I'm allergic to pollens, dust, all southern grasses/trees, mold, mildue, cats, dogs (01/07/2017)   Anal fissure    Asthma    sees Dr. Alaine    Benign prostatic hypertrophy    (sees Dr. Colan   CAD (coronary artery disease)    a. s/p CABG 2007. b. Cath 08/2016 - 4/4 patent grafts.   Carotid bruit    carotid  u/s 10/10: 0.39% bilaterally   Chronic combined systolic and diastolic CHF (congestive heart failure) (HCC)    Complication of anesthesia 1980s   w/anal cyst OR, he gave me a saddle block then put a narcotic in spinal cord; had a severe reaction to that (01/07/2017)   Congestive heart failure (CHF) (HCC)    Coronavirus infection 11/18/2023   ED (erectile dysfunction)    Family history of adverse reaction to anesthesia    daughter wakes up during OR (01/07/2017)   GERD (gastroesophageal reflux disease)    Gout    HTN (hypertension)    Hx of colonic polyps    (sees Dr. Abran)   Hyperlipidemia    Hypothyroidism    Moderate to severe aortic stenosis    a. s/p TAVR 02/2017.   Myocardial infarction [facility]) ~ 2000   Obesity    Osteoarthritis    was in my knees, hands (01/07/2017 )   PAF (paroxysmal atrial fibrillation) (HCC)    a. documented post TAVR.   Precancerous skin lesion    (sees Dr. Ivin)   Prostate cancer [facility]) dx'd ~ 2014   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   29 mm Edwards Sapien 3 transcatheter heart valve placed via percutaneous right transfemoral approach    Past Surgical History:  Procedure Laterality Date   ANUS SURGERY     opened it back up cause it wouldn't heal; wound up w/a fissure (01/07/2017)   BIV ICD INSERTION CRT-D N/A 05/02/2017   Procedure: BIV ICD INSERTION CRT-D;  Surgeon: [name], MD;  Location: [facility] INVASIVE CV LAB;  Service: Cardiovascular;  Laterality: N/A;   CARDIAC CATHETERIZATION  10/29/2005   CARDIAC CATHETERIZATION N/A 08/27/2016   Procedure: Right/Left Heart Cath and Coronary/Graft Angiography;  Surgeon: Lonni JONETTA Cash, MD;  Location: [facility] INVASIVE CV LAB;  Service: Cardiovascular;  Laterality: N/A;   CARDIOVERSION N/A 04/24/2021   Procedure: CARDIOVERSION;  Surgeon: Okey Vina GAILS, MD;  Location: [facility] ENDOSCOPY;  Service: Cardiovascular;  Laterality: N/A;   CARDIOVERSION N/A 11/08/2021    Procedure: CARDIOVERSION;  Surgeon: Mona Vinie BROCKS, MD;  Location: [facility] ENDOSCOPY;  Service: Cardiovascular;  Laterality: N/A;   CARDIOVERSION N/A 02/06/2022   Procedure: CARDIOVERSION;  Surgeon: Delford Maude BROCKS, MD;  Location: [facility] ENDOSCOPY;  Service: Cardiovascular;  Laterality: N/A;   CATARACT EXTRACTION W/ INTRAOCULAR LENS  IMPLANT, BILATERAL Bilateral    CATARACT EXTRACTION, BILATERAL  2012   COLONOSCOPY  06/30/2008   no repeats needed    COLONOSCOPY     had 3 or 4 in the past    CORONARY ARTERY BYPASS GRAFT  2007   CABG X4   CYST EXCISION PERINEAL  1980s   HAMMER TOE SURGERY Bilateral    ICD GENERATOR CHANGEOUT N/A 05/30/2023   Procedure: ICD GENERATOR CHANGEOUT;  Surgeon: [name], MD;  Location: [facility] INVASIVE CV LAB;  Service: Cardiovascular;  Laterality: N/A;   JOINT REPLACEMENT     KNEE ARTHROPLASTY  07/30/2011   Procedure: COMPUTER ASSISTED TOTAL KNEE ARTHROPLASTY;  Surgeon: Cordella Glendia Hutchinson;  Location: [facility] OR;  Service: Orthopedics;  Laterality: Left;  left total knee arthroplasty   MASTECTOMY SUBCUTANEOUS Bilateral    MULTIPLE TOOTH EXTRACTIONS     ORIF FINGER / THUMB FRACTURE Right ~ 1980   repair of thumb injury   PROSTATE BIOPSY     REPLACEMENT TOTAL KNEE BILATERAL Bilateral 2012   TEE WITHOUT CARDIOVERSION N/A 03/04/2017   Procedure: TRANSESOPHAGEAL ECHOCARDIOGRAM (TEE);  Surgeon: [name], MD;  Location: [facility] OR;  Service: Open Heart Surgery;  Laterality: N/A;   TOTAL KNEE REVISION Right 05/18/2019   Procedure: RIGHT PATELLA REVISION/REMOVAL;  Surgeon: Addie Cordella Hamilton, MD;  Location: [facility] OR;  Service: Orthopedics;  Laterality: Right;   TRANSCATHETER AORTIC VALVE REPLACEMENT, TRANSFEMORAL N/A 03/04/2017   Procedure: TRANSCATHETER AORTIC VALVE REPLACEMENT, TRANSFEMORAL;  Surgeon: [name], MD;  Location: [facility] OR;  Service: Open Heart Surgery;  Laterality: N/A;     Home Medications:  Prior to Admission medications  Medication Sig  Start Date End Date Taking? Authorizing Provider  albuterol  (VENTOLIN  HFA) 108 (90 Base) MCG/ACT inhaler Inhale 2 puffs into the lungs every 4 (four) hours as needed for wheezing or shortness of breath. 05/06/23  Yes [name], MD  allopurinol  (ZYLOPRIM ) 100 MG tablet Take 1 tablet (100 mg total) by mouth daily. 09/18/23  Yes [name], MD  amiodarone  (PACERONE ) 200 MG tablet Take 1 tablet (200 mg total) by mouth daily. 01/14/24  Yes [name], MD  aspirin  81 MG chewable tablet Chew 81 mg by mouth daily.   Yes [provider]  atorvastatin  (LIPITOR) 20 MG tablet Take 20 mg by mouth daily.   Yes [provider]  benzonatate  (TESSALON ) 100 MG capsule Take 100 mg by mouth 3 (three) times daily.   Yes [provider]  cetirizine (ZYRTEC) 5 MG tablet Take 5 mg by mouth daily.   Yes [provider]  dabigatran  (PRADAXA ) 150 MG CAPS capsule TAKE (1) CAPSULE TWICE DAILY. Patient taking differently: Take 150 mg by mouth 2 (two) times daily. 10/24/23  Yes [name], MD  diclofenac  Sodium (VOLTAREN ) 1 % GEL Apply 4 g topically 3 (three) times daily as needed (pain). 01/26/24  Yes [name], MD  docusate sodium  (COLACE) 100 MG capsule Take 100 mg by mouth in the morning and at bedtime.   Yes [provider]  ferrous sulfate  325 (65 FE) MG tablet Take 1 tablet (325 mg total) by mouth daily with breakfast. 09/15/24  Yes Darci Pore, MD  fluticasone  (FLONASE ) 50 MCG/ACT nasal spray Place 2 sprays into both nostrils daily. 12/26/23  Yes [provider]  levothyroxine  (SYNTHROID ) 175 MCG tablet Take 175 mcg by mouth daily before breakfast.   Yes [provider]  metoCLOPramide  (REGLAN ) 10 MG tablet Take 10 mg by mouth 2 (two) times daily before a meal.   Yes [provider]  metoprolol  succinate (TOPROL  XL) 25 MG 24 hr tablet Take 1 tablet (25 mg total) by mouth at bedtime. 09/15/24  Yes [name], [name],  FNP  montelukast  (SINGULAIR ) 10 MG tablet TAKE ONE TABLET BY MOUTH ONCE DAILY IN THE EVENING 08/20/22  Yes Hunsucker, Donnice SAUNDERS, MD  Multiple Vitamins-Minerals (MULTIVITAMIN GUMMIES MENS) CHEW Chew 2 each by mouth daily.   Yes [provider]  oxyCODONE  (OXY IR/ROXICODONE ) 5 MG immediate release tablet Take 5 mg by mouth every 8 (eight) hours as needed (knee pain).   Yes [provider]  pantoprazole  (PROTONIX ) 40 MG tablet  Take 40 mg by mouth in the morning and at bedtime.   Yes [provider]  potassium chloride  SA (KLOR-CON  M) 20 MEQ tablet Take 4 tablets (80 mEq total) by mouth daily. 09/20/24  Yes Lee, Jordan, NP  tamsulosin  (FLOMAX ) 0.4 MG CAPS capsule Take 0.4 mg by mouth at bedtime.   Yes [provider]  Tiotropium Bromide  Monohydrate (SPIRIVA  RESPIMAT) 2.5 MCG/ACT AERS Inhale 2 puffs into the lungs daily. 02/17/24  Yes Hope Almarie ORN, NP  torsemide  (DEMADEX ) 100 MG tablet Take 1 tablet (100 mg total) by mouth daily. 09/20/24  Yes Lee, Jordan, NP  triamcinolone  cream (KENALOG ) 0.5 % Apply 1 Application topically as directed. Patient taking differently: Apply 1 Application topically 2 (two) times daily. Apply to back 08/06/24  Yes [provider]  venlafaxine  XR (EFFEXOR -XR) 37.5 MG 24 hr capsule Take 37.5 mg by mouth daily.   Yes [provider]  ertapenem  (INVANZ ) IVPB Inject 1 g into the vein in the morning and at bedtime. Patient not taking: Reported on 09/23/2024    [provider]  OXYGEN Inhale 2 L/min into the lungs continuous.    [provider]  predniSONE  (DELTASONE ) 20 MG tablet Take 40 mg by mouth daily with breakfast. Patient not taking: Reported on 09/23/2024    [provider]  UNABLE TO FIND Inhale 1 Device into the lungs at bedtime. CPAP; Apply CPAP mask at bedtime.    [provider]    Scheduled Meds:  Continuous Infusions:  PRN Meds:   Allergies:   Allergies[1]  Social  History:   Social History   Socioeconomic History   Marital status: Married    Spouse name: Not on file   Number of children: 3   Years of education: Not on file   Highest education level: Not on file  Occupational History   Occupation: product/process development scientist    Comment: builds malls  Tobacco Use   Smoking status: Former    Current packs/day: 0.00    Average packs/day: 3.5 packs/day for 13.0 years (45.5 ttl pk-yrs)    Types: Cigarettes    Start date: 56    Quit date: 1963    Years since quitting: 63.1   Smokeless tobacco: Never   Tobacco comments:    Former smoker 04/19/21  Vaping Use   Vaping status: Never Used  Substance and Sexual Activity   Alcohol use: Not Currently    Alcohol/week: 7.0 standard drinks of alcohol    Types: 7 Cans of beer per week    Comment: 1 beer daily after 5pm (04/19/21)   Drug use: No   Sexual activity: Never  Other Topics Concern   Not on file  Social History Narrative   FH of CAD, Male 1st degree relative less than age 38.   Social Drivers of Health   Tobacco Use: Medium Risk (09/23/2024)   Patient History    Smoking Tobacco Use: Former    Smokeless Tobacco Use: Never    Passive Exposure: Not on file  Financial Resource Strain: Low Risk (03/31/2023)   Overall Financial Resource Strain (CARDIA)    Difficulty of Paying Living Expenses: Not very hard  Food Insecurity: No Food Insecurity (09/09/2024)   Epic    Worried About Programme Researcher, Broadcasting/film/video in the Last Year: Never true    Ran Out of Food in the Last Year: Never true  Transportation Needs: Patient Declined (09/09/2024)   Epic    Lack of Transportation (Medical): Patient declined  Lack of Transportation (Non-Medical): Patient declined  Physical Activity: Sufficiently Active (03/31/2023)   Exercise Vital Sign    Days of Exercise per Week: 7 days    Minutes of Exercise per Session: 30 min  Stress: No Stress Concern Present (03/31/2023)   [employer] of Occupational Health - Occupational  Stress Questionnaire    Feeling of Stress : Not at all  Social Connections: Patient Declined (09/09/2024)   Social Connection and Isolation Panel    Frequency of Communication with Friends and Family: Patient declined    Frequency of Social Gatherings with Friends and Family: Patient declined    Attends Religious Services: Patient declined    Active Member of Clubs or Organizations: Patient declined    Attends Banker Meetings: Patient declined    Marital Status: Patient declined  Intimate Partner Violence: Patient Declined (09/09/2024)   Epic    Fear of Current or Ex-Partner: Patient declined    Emotionally Abused: Patient declined    Physically Abused: Patient declined    Sexually Abused: Patient declined  Depression (PHQ2-9): Low Risk (04/05/2024)   Depression (PHQ2-9)    PHQ-2 Score: 0  Alcohol Screen: Low Risk (03/31/2023)   Alcohol Screen    Last Alcohol Screening Score (AUDIT): 0  Housing: Unknown (09/09/2024)   Epic    Unable to Pay for Housing in the Last Year: Patient declined    Number of Times Moved in the Last Year: 0    Homeless in the Last Year: Patient declined  Utilities: Not At Risk (09/09/2024)   Epic    Threatened with loss of utilities: No  Health Literacy: Adequate Health Literacy (03/31/2023)   B1300 Health Literacy    Frequency of need for help with medical instructions: Never    Family History:    Family History  Problem Relation Age of Onset   Heart attack Father 45   Allergic rhinitis Father    Asthma Father    Heart failure Mother 76   Uterine cancer Mother    Breast cancer Mother    Colon cancer Neg Hx    Esophageal cancer Neg Hx      ROS:  Please see the history of present illness.  All other ROS reviewed and negative.     Physical Exam/Data: Vitals:   09/23/24 1140 09/23/24 1415 09/23/24 1609  BP: (!) 94/51 (!) 108/59 113/72  Pulse: 85 85 81  Resp: 20 16 16   Temp: 98.4 F (36.9 C)  97.6 F (36.4 C)  TempSrc: Oral  Oral   SpO2: 100% 100% 100%   No intake or output data in the 24 hours ending 09/23/24 1905    09/15/2024   10:46 AM 09/15/2024    9:39 AM 09/06/2024    1:16 PM  Last 3 Weights  Weight (lbs) 257 lb 249 lb 9.6 oz 250 lb  Weight (kg) 116.574 kg 113.218 kg 113.399 kg     There is no height or weight on file to calculate BMI.  General: Well developed, well nourished, in no acute distress. Head: Normocephalic, atraumatic, sclera non-icteric, no xanthomas, nares are without discharge. Neck: Negative for carotid bruits. JVP not elevated. Lungs: Clear bilaterally to auscultation without wheezes, rales, or rhonchi. Breathing is unlabored. Heart: Irregular, S1 S2 without murmurs, rubs, or gallops.  Abdomen: Soft, non-tender, non-distended with normoactive bowel sounds. No rebound/guarding. Extremities: No clubbing or cyanosis. No edema. Distal pedal pulses are 2+ and equal bilaterally. Neuro: Alert and oriented X 3. Moves all extremities spontaneously.  Psych:  Responds to questions appropriately with a normal affect.   EKG:  The EKG was personally reviewed and demonstrates:  atrial fib with V pacing 85bpm Telemetry:  Telemetry was personally reviewed and demonstrates:  atrial fib, at times native rhythm other times V pacing, occasional PVCs  Relevant CV Studies: Echo 10/2023   1. Left ventricular ejection fraction, by estimation, is 40 to 45%. The  left ventricle has mildly decreased function. Left ventricular diastolic  parameters are indeterminate. Abnormal wall motion adjacent to RV pacing  lead.   2. Right ventricular systolic function is moderately reduced. The right  ventricular size is moderately enlarged. There is normal pulmonary artery  systolic pressure.   3. Left atrial size was severely dilated.   4. Right atrial size was severely dilated.   5. Large pleural effusion in the left lateral region.   6. The mitral valve is grossly normal. No evidence of mitral valve  regurgitation. No  evidence of mitral stenosis.   7. Tricuspid valve regurgitation is mild to moderate.   8. The aortic valve has been repaired/replaced. Aortic valve  regurgitation is not visualized. Echo findings are consistent with normal  structure and function of the aortic valve prosthesis. Aortic valve mean  gradient measures 5.0 mmHg.    Laboratory Data: High Sensitivity Troponin:  No results for input(s): TROPONINIHS in the last 720 hours.  Recent Labs  Lab 09/08/24 0656 09/10/24 1332 09/10/24 1439 09/23/24 1418 09/23/24 1646  TRNPT 47* 47* 46* 43* 43*      Chemistry Recent Labs  Lab 09/23/24 1418 09/23/24 1430  NA 141 141  141  K 3.4* 3.3*  3.3*  CL 99 97*  CO2 31  --   GLUCOSE 102* 101*  BUN 20 21  CREATININE 1.33* 1.40*  CALCIUM  8.4*  --   MG 1.9  --   GFRNONAA 51*  --   ANIONGAP 11  --     Recent Labs  Lab 09/23/24 1418  PROT 6.6  ALBUMIN  2.9*  AST 40  ALT 22  ALKPHOS 180*  BILITOT 0.7   Lipids No results for input(s): CHOL, TRIG, HDL, LABVLDL, LDLCALC, CHOLHDL in the last 168 hours.  Hematology Recent Labs  Lab 09/23/24 1418 09/23/24 1430  WBC 10.3  --   RBC 4.08*  --   HGB 11.0* 11.9*  11.9*  HCT 35.1* 35.0*  35.0*  MCV 86.0  --   MCH 27.0  --   MCHC 31.3  --   RDW 18.2*  --   PLT 312  --    Thyroid  No results for input(s): TSH, FREET4 in the last 168 hours.  BNP Recent Labs  Lab 09/23/24 1418  PROBNP 3,327.0*    DDimer No results for input(s): DDIMER in the last 168 hours.  Radiology/Studies:  CT CHEST ABDOMEN PELVIS W CONTRAST Result Date: 09/23/2024 EXAM: CT CHEST, ABDOMEN AND PELVIS WITH CONTRAST 09/23/2024 03:33:22 PM TECHNIQUE: CT of the chest, abdomen and pelvis was performed with the administration of 75 mL iohexol  (OMNIPAQUE ) 350 MG/ML injection. Multiplanar reformatted images are provided for review. Automated exposure control, iterative reconstruction, and/or weight based adjustment of the mA/kV was utilized to  reduce the radiation dose to as low as reasonably achievable. COMPARISON: 09/23/2024, 09/10/2024, 01/27/2024 CLINICAL HISTORY: Polytrauma, blunt. FINDINGS: CHEST: MEDIASTINUM AND LYMPH NODES: Moderate cardiomegaly. Endovascular aortic valve replacement. Left chest pacemaker/automatic implantable cardioverter-defibrillator (AICD) with leads terminating in the right atrium, right ventricle, and coronary sinus. Coronary artery bypass graft (CABG) changes.  Diffuse aortic atherosclerosis. Moderate narrowing of the proximal right subclavian artery due to a chunky calcified plaque (axial 6). The central airways are clear. No mediastinal, hilar or axillary lymphadenopathy. LUNGS AND PLEURA: Small right and trace left pleural effusions, decreased in size in the interim. Similar appearance of the scattered reticular opacities throughout the lungs. A couple of nodular opacities are noted in the medial left lower lobe. No focal consolidation or pulmonary edema. No pneumothorax. ABDOMEN AND PELVIS: LIVER: Unremarkable. GALLBLADDER AND BILE DUCTS: Unremarkable. No biliary ductal dilatation. SPLEEN: No acute abnormality. PANCREAS: Mild pancreatic parenchymal fatty atrophy. ADRENAL GLANDS: No acute abnormality. KIDNEYS, URETERS AND BLADDER: Mild renal cortical atrophy bilaterally. No stones in the kidneys or ureters. No hydronephrosis. No perinephric or periureteral stranding. Urinary bladder is unremarkable. GI AND BOWEL: Stomach demonstrates no acute abnormality. Normal appendix. Descending and sigmoid colon diverticulosis. No changes of acute diverticulitis. There is no bowel obstruction. REPRODUCTIVE ORGANS: No acute abnormality. PERITONEUM AND RETROPERITONEUM: No ascites. No free air. VASCULATURE: Aorta is normal in caliber. Diffuse aortoiliac atherosclerosis. Severe stenoses at both the celiac and superior mesenteric artery (SMA) ostia from calcified plaque. ABDOMINAL AND PELVIS LYMPH NODES: No lymphadenopathy. BONES AND SOFT  TISSUES: Diffuse osteopenia. Multilevel degenerative disc disease of the spine. Sternotomy wires. Subtle cortical irregularity along the right anterior 5th rib, which could represent a remote fracture. Correlation with point tenderness recommended to exclude an acute buckle type fracture. No focal soft tissue abnormality. IMPRESSION: 1. Subtle cortical irregularity along the right anterior 5th rib, which could represent a remote fracture. Correlation with point tenderness recommended to exclude an acute, buckled type rib fracture. 2. Small right and trace left pleural effusions, decreased in size in the interim. A couple of nodular opacities noted in the left lower lobe, which may represent changes of a developing bronchial pneumonia or aspiration. 3. Descending and sigmoid colonic diverticulosis. No changes of acute diverticulitis. Electronically signed by: Rogelia Myers MD 09/23/2024 04:33 PM EST RP Workstation: [id number]   CT Head Wo Contrast Result Date: 09/23/2024 EXAM: CT HEAD WITHOUT CONTRAST 09/23/2024 03:33:22 PM TECHNIQUE: CT of the head was performed without the administration of intravenous contrast. Automated exposure control, iterative reconstruction, and/or weight based adjustment of the mA/kV was utilized to reduce the radiation dose to as low as reasonably achievable. COMPARISON: Head CT 10/27/2020. CLINICAL HISTORY: Head trauma, minor (Age 68 years or older). Mechanical fall on blood thinners. FINDINGS: BRAIN AND VENTRICLES: There is no evidence of an acute infarct, intracranial hemorrhage, mass, midline shift, hydrocephalus, or extra-axial fluid collection. There is mild cerebral atrophy. Patchy to confluent hypodensities in the cerebral white matter bilaterally have progressed and are nonspecific but compatible with moderate chronic small vessel ischemic disease. There are small chronic bilateral cerebellar infarcts. Calcified atherosclerosis at the skull base. ORBITS: Bilateral cataract  extraction. SINUSES: No acute abnormality. SOFT TISSUES AND SKULL: No acute soft tissue abnormality. No skull fracture. IMPRESSION: 1. No acute intracranial abnormality. 2. Moderate chronic small vessel ischemic disease. 3. Small chronic bilateral cerebellar infarcts. 4. Cerebral Atrophy (ICD10-G31.9). Electronically signed by: Dasie Hamburg MD 09/23/2024 04:06 PM EST RP Workstation: [id number]   CT Cervical Spine Wo Contrast Result Date: 09/23/2024 EXAM: CT CERVICAL SPINE WITH CONTRAST 09/23/2024 03:33:22 PM TECHNIQUE: CT of the cervical spine was performed with the administration of intravenous contrast (75 mL iohexol  (OMNIPAQUE ) 350 MG/ML injection). Multiplanar reformatted images are provided for review. Automated exposure control, iterative reconstruction, and/or weight based adjustment of the mA/kV was utilized to reduce the radiation  dose to as low as reasonably achievable. COMPARISON: None available. CLINICAL HISTORY: Neck trauma. Mechanical fall. On blood thinners. FINDINGS: BONES AND ALIGNMENT: Straightening of the normal cervical lordosis. No significant listhesis. No acute fracture or suspicious lesion. DEGENERATIVE CHANGES: Moderate diffuse cervical disc degeneration and advanced facet arthrosis. Bilateral facet and partial interbody ankylosis at C2-C3. Moderate to severe left neural foraminal stenosis at C3-C4. No evidence of high grade spinal canal stenosis. SOFT TISSUES: No prevertebral soft tissue swelling. IMPRESSION: 1. No acute cervical spine fracture or traumatic malalignment. Electronically signed by: Dasie Hamburg MD 09/23/2024 04:03 PM EST RP Workstation: [id number]   DG Knee Complete 4 Views Right Result Date: 09/23/2024 EXAM: 4 VIEW(S) XRAY OF THE RIGHT KNEE 09/23/2024 01:53:00 PM COMPARISON: None available. CLINICAL HISTORY: Right knee pain and swelling. 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 Resurfaced patella with increased sclerosis and stable inferior  pole patchy lucency with increased spur formation and possible increased heterotopic ossification along the proximal patellar tendon. Small joint effusion. No acute fracture. No malalignment. SOFT TISSUES: Vascular calcifications. Diffuse soft tissue swelling. IMPRESSION: 1. Small joint effusion and diffuse soft tissue swelling. 2. 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 3. Resurfaced patella with increased sclerosis, stable inferior pole patchy lucency, increased spur formation, and possible increased heterotopic ossification along the proximal patellar tendon. 4. Vascular calcifications. Electronically signed by: Ryan Salvage MD 09/23/2024 02:58 PM EST RP Workstation: [id number]   DG Chest Portable 1 View Result Date: 09/23/2024 CLINICAL DATA:  Shortness of breath. EXAM: PORTABLE CHEST 1 VIEW COMPARISON:  Chest radiograph dated 09/07/2024. FINDINGS: Cardiomegaly with vascular congestion. No focal consolidation, pleural effusion or pneumothorax. Left pectoral AICD device. Median sternotomy wires. No acute osseous pathology. IMPRESSION: Cardiomegaly with vascular congestion. Electronically Signed   By: Vanetta Chou M.D.   On: 09/23/2024 14:47     Assessment and Plan:  1. Recurrent falls - suspect multifactorial  2. Acute on chronic HFrEF - recent concern patient was nearing end-stage, with recent medication adjustment due to AKI, UTI, electrolyte abnormalities - Optivol suggests volume overload per notes - patient has been feeling SOB with minimal exertion - CT chest with small right and trace left pleural effusions, decreased in size in the interim, cannot exclude developing PNA vs aspiration - PTA GDMT: Toprol  25mg  daily, PTA torsemide  dosing variable - previously on 100mg  BID then 40mg  BID then 100mg  daily - GDMT here: got 80mg  IV Lasix  x 1 + KCl. Given lactic acidosis and soft BP, will hold beta blocker for now - will review further dosing with MD - avoid SGLT2i, ACE, ARB, ARNI  given issues above - hold off escalating BB dose given decompensation - appears warm/perfused presently - would benefit from HF team picking up in rounds in AM; sent msg to cardmaster inbasket to reach out to them in AM  3. Permanent atrial fibrillation - rates OK, on Toprol  25mg  daily and amiodarone  200mg  daily - on Pradaxa  150mg  BID, CrCl 59.85 so OK for present dose  4. 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 [name], therapies are active per his review)  5. Lactic acidosis - manage in context above - CT cannot exclude  developing PNA vs aspiration, abx mgmt per primary team  6. CKD stage 3b - Cr baseline is quite variable recently due to readmissions with AKI, but had settled to around 2 in 07/2024 - follow, anticipate increase with diuresis - avoid additional renal stressors  7. CAD s/p CABG 2007 - hsTroponins low/flat similar to prior values, suspected demand ischemia - CHF team had planned to stop aspirin  at next OV, would hold given frequent falls  8. Chronic respiratory failure on home O2 - also on CPAP qhs  Risk Assessment/Risk Scores:       [place]  Heart Association (NYHA) Functional Class NYHA Class III  CHA2DS2-VASc Score = 6   This indicates a 9.7% annual risk of stroke. The patient's score is based upon: CHF History: 1 HTN History: 1 Diabetes History: 1 Stroke History: 0 Vascular Disease History: 1 Age Score: 2 Gender Score: 0      For questions or updates, please contact [facility] Please consult [email or web address] for contact info under      Signed, Solon Alban N Antawn Sison, PA-C  09/23/2024 7:05 PM      [1]  Allergies Allergen  Reactions   Peanut (Diagnostic) Anaphylaxis   Peanut-Containing Drug Products Anaphylaxis   Sulfa Antibiotics Anaphylaxis   Sulfonamide Derivatives Anaphylaxis   Eliquis  [Apixaban ] Other (See Comments)    Back/hip pain   Norvasc  [Amlodipine ] Swelling    *lower extremity swelling   Xarelto  [Rivaroxaban ] Other (See Comments)    Back/hip pain   Zestril  [Lisinopril ] Cough   Lactose Other (See Comments)    Unknown reaction   "

## 2024-09-23 NOTE — Assessment & Plan Note (Signed)
 Will replete and recheck

## 2024-09-23 NOTE — Assessment & Plan Note (Signed)
 Continue amiodarone 200mg  daily

## 2024-09-23 NOTE — ED Notes (Signed)
 Phlebotomy attempted to obtain second set of lab cultures but was unsuccessful

## 2024-09-23 NOTE — H&P (Signed)
 "    Justin [name] FMW:[id number] DOB: Jan 19, 1934 DOA: 09/23/2024     PCP: Mast, Man X, NP   Outpatient Specialists:   CARDS:  Dr. [name], MD   Patient arrived to ER on 09/23/24 at 1130 Referred by Attending Silvester Ales, MD   Patient coming from:    From facility   [place], [facility]    Chief Complaint:   Chief Complaint  Patient presents with   Fall   Weakness   Shortness of Breath    HPI: Justin [name] is a 89 y.o. male with medical history significant of  AICD placement, status post TAVR, history of atrial fibrillation on Pradaxa , OSA on CPAP, history of left pleural effusion status post thoracentesis, gout, CKD, baseline creatinine 1.65, DMT2, macular degeneration, hypertension, coronary artery disease, and hypothyroidism, hx of CVA, ESBL urosepsis    Presented with  fall on blood thinners Patient has been having increased falls last 1 was today when he was in the restroom while using a walker apparently bent over and fell this was his fourth fall this week.  Denies hitting head.  Patient is on blood thinners he does not ambulate much on arrival blood pressure 156/87 satting 98% on 3 L which is his baseline between 2 to 3 L heart rate noted to be at 107.  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 coli ESBL urosepsis resulting in AKI his discharge weight was 249 pounds diuretics had to be stopped when he went back to the clinic follow-up with cardiology on 21st he noted to be fluid overloaded and torsemide  was restarted 40 mg a day as well as his Toprol  was restarted despite that patient continued to retain fluid and torsemide  was increased up to 100 mg daily.  Unfortunately patient  continues to be short of breath with exertion.  He has been doing worse since his previous hospitalization. Feels like he is accumulating fluid in his abdomen and legs somewhat confused and more fatigued.  Feels like his legs give out from underneath him.  Patient denies hitting his head during the last fall feels like he mainly fell on the back Family denied any fevers no chest pain no nausea no vomiting  In the ER proBNP 3327 potassium 3.3 creatinine 1.4 the lactic acid 2.4 troponin 40 stable x 2 EKG showing atrial fibrillation Imaging showed no evidence of trauma Patient was given a dose of Lasix     After further discussion patient is actually unsure if he did hit his head but CT head non acute He is stating he is able to urinate Reports some constipation Patient does have episode of vertigo although he is not able to provide detailed history regarding this.   Denies significant ETOH intake   Does not smoke     Regarding pertinent Chronic problems:    Hyperlipidemia - on statins Lipitor (atorvastatin )  Lipid Panel     Component Value Date/Time   CHOL 108 10/14/2023 0729   CHOL 171 08/11/2018 0735   TRIG 113 10/14/2023 0729   TRIG 143 06/30/2006 1132   HDL 33 (L) 10/14/2023 0729   HDL 34 (L) 08/11/2018 0735   CHOLHDL 3.3 10/14/2023  0729   VLDL 19 07/03/2023 0426   LDLCALC 55 10/14/2023 0729   LABVLDL 64 (H) 08/11/2018 0735     HTN on Toprol    chronic CHF  systolic - last echo  Recent Results (from the past 56199 hours)  ECHOCARDIOGRAM COMPLETE   Collection Time: 07/01/23  9:13 AM  Result Value   Weight 4,326.4   BP 129/72   Single Plane A2C EF 30.7   Single Plane A4C EF 54.2   Calc EF 45.1   S' Lateral 3.10   AR max vel 2.53   AV Area VTI 2.83   AV Mean grad 7.6   AV Peak grad 13.8   Ao pk vel 1.86   Area-P 1/2 3.88   AV Area mean vel 2.41   Est EF 40 - 45%   Narrative      ECHOCARDIOGRAM REPORT      1. LV appears mildly reduced with hypokinesis of the mid  to apical inferoseptum and mid inferior segments. This could be related to pacing/postop septum. There is significant respiratory variation in septal movement (best seen on image 102) which can be  seen in pericardial constriction. Clinical correlation is recommended. Left ventricular ejection fraction, by estimation, is 40 to 45%. The left ventricle has mildly decreased function. The left ventricle demonstrates regional wall motion abnormalities  (see scoring diagram/findings for description). Left ventricular diastolic function could not be evaluated.  2. Right ventricular systolic function is moderately reduced. The right ventricular size is moderately enlarged. There is normal pulmonary artery systolic pressure. The estimated right ventricular systolic pressure is 29.3 mmHg.  3. Left atrial size was severely dilated.  4. Right atrial size was severely dilated.  5. The mitral valve is degenerative. Trivial mitral valve regurgitation. No evidence of mitral stenosis. Moderate to severe mitral annular calcification.  6. The aortic valve has been repaired/replaced. Aortic valve regurgitation is not visualized. There is a 29 mm Edwards Sapien prosthetic (TAVR) valve present in the aortic position. Procedure Date: 03/04/2017. Echo findings are consistent with normal  structure and function of the aortic valve prosthesis. Aortic valve area, by VTI measures 2.83 cm. Aortic valve mean gradient measures 7.6 mmHg. Aortic valve Vmax measures 1.86 m/s.  7. The inferior vena cava is dilated in size with <50% respiratory variability, suggesting right atrial pressure of 15 mmHg.            CAD  - On Aspirin , statin, betablocker                 -  followed by cardiology                     DM 2 -  Lab Results  Component Value Date   HGBA1C 6.2 (H) 08/04/2024   diet controlled   Hypothyroidism:   Lab Results  Component Value Date   TSH 1.969 08/03/2024   T3TOTAL 70 (L) 11/19/2023   on synthroid      Morbid obesity-   BMI Readings from Last 1 Encounters:  09/15/24 41.48 kg/m                    COPD  on baseline oxygen 2-3L,      OSA -on nocturnal  CPAP     Hx of CVA -  with/out residual deficits on Aspirin  81 mg    A. Fib -   atrial fibrillation CHA2DS2 vas score      6  current  on anticoagulation with Pradaxa      -  Rate control:  Currently controlled with  Toprolol,           - Rhythm control:   amiodarone ,       CKD stage IIIa   baseline Cr 1.4 Estimated Creatinine Clearance: 43.8 mL/min (A) (by C-G formula based on SCr of 1.4 mg/dL (H)).  Lab Results  Component Value Date   CREATININE 1.40 (H) 09/23/2024   CREATININE 1.33 (H) 09/23/2024   CREATININE 1.50 (H) 09/14/2024   Lab Results  Component Value Date   NA 141 09/23/2024   NA 141 09/23/2024   CL 97 (L) 09/23/2024   K 3.3 (L) 09/23/2024   K 3.3 (L) 09/23/2024   CO2 31 09/23/2024   BUN 21 09/23/2024   CREATININE 1.40 (H) 09/23/2024   GFRNONAA 51 (L) 09/23/2024   CALCIUM  8.4 (L) 09/23/2024   PHOS 3.3 07/01/2023   ALBUMIN  2.9 (L) 09/23/2024   GLUCOSE 101 (H) 09/23/2024          Chronic anemia - baseline hg Hemoglobin & Hematocrit  Recent Labs    09/14/24 0432 09/23/24 1418 09/23/24 1430  HGB 12.4* 11.0* 11.9*  11.9*   Iron/TIBC/Ferritin/ %Sat    Component Value Date/Time   IRON 45 09/13/2024 1211   TIBC 353 09/13/2024 1211   FERRITIN 217 09/13/2024 1211   IRONPCTSAT 13 (L) 09/13/2024 1211    GERD PPI twice daily  While in ER:     Cardiology has been consulted and seen patient in ER they recommended to hold off on further diuretics after the initial 80 mg of Lasix  was given given soft blood pressures Recommending holding aspirin  and repeating echo  Lab Orders         Culture, blood (routine x 2)         Urinalysis, w/ Reflex to Culture (Infection Suspected) -Urine, Clean Catch         Comprehensive metabolic panel         Pro Brain natriuretic peptide         CBC with  Differential         Magnesium          I-stat chem 8, ED         I-Stat Lactic Acid         I-Stat venous blood gas, ED     Right Knee 4 views - Small joint effusion and diffuse soft tissue swelling.  CT HEAD   NON acute Small chronic bilateral cerebellar infarcts    CT neck non acute CXR - Cardiomegaly with vascular congestion.   CTabd/pelvis chest -   Small right and trace left pleural effusions, decreased in size in the interim.   nodular opacities noted in the left lower lobe, which may represent changes of a developing bronchial pneumonia or aspiration.  Following Medications were ordered in ER: Medications  potassium chloride  SA (KLOR-CON  M) CR tablet 20 mEq (has no administration in time range)  iohexol  (OMNIPAQUE ) 350 MG/ML injection 75 mL (75 mLs Intravenous Contrast Given 09/23/24 1529)  furosemide  (LASIX ) injection 80 mg (80 mg Intravenous Given 09/23/24 1900)    _______________________________________________________ ER Provider Called:    Cardiology      They Recommend admit to medicine    SEEN in ER   ED Triage Vitals [09/23/24 1140]  Encounter Vitals Group     BP (!) 94/51     Girls Systolic BP Percentile  Girls Diastolic BP Percentile      Boys Systolic BP Percentile      Boys Diastolic BP Percentile      Pulse Rate 85     Resp 20     Temp 98.4 F (36.9 C)     Temp Source Oral     SpO2 100 %     Weight      Height      Head Circumference      Peak Flow      Pain Score      Pain Loc      Pain Education      Exclude from Growth Chart   UFJK(75)@     _________________________________________ Significant initial  Findings: Abnormal Labs Reviewed  URINALYSIS, W/ REFLEX TO CULTURE (INFECTION SUSPECTED) - Abnormal; Notable for the following components:      Result Value   Color, Urine STRAW (*)    Bacteria, UA RARE (*)    All other components within normal limits  COMPREHENSIVE METABOLIC PANEL WITH GFR - Abnormal; Notable for the following  components:   Potassium 3.4 (*)    Glucose, Bld 102 (*)    Creatinine, Ser 1.33 (*)    Calcium  8.4 (*)    Albumin  2.9 (*)    Alkaline Phosphatase 180 (*)    GFR, Estimated 51 (*)    All other components within normal limits  PRO BRAIN NATRIURETIC PEPTIDE - Abnormal; Notable for the following components:   Pro Brain Natriuretic Peptide 3,327.0 (*)    All other components within normal limits  CBC WITH DIFFERENTIAL/PLATELET - Abnormal; Notable for the following components:   RBC 4.08 (*)    Hemoglobin 11.0 (*)    HCT 35.1 (*)    RDW 18.2 (*)    Monocytes Absolute 1.2 (*)    All other components within normal limits  I-STAT CHEM 8, ED - Abnormal; Notable for the following components:   Potassium 3.3 (*)    Chloride 97 (*)    Creatinine, Ser 1.40 (*)    Glucose, Bld 101 (*)    Calcium , Ion 1.06 (*)    Hemoglobin 11.9 (*)    HCT 35.0 (*)    All other components within normal limits  I-STAT CG4 LACTIC ACID, ED - Abnormal; Notable for the following components:   Lactic Acid, Venous 2.4 (*)    All other components within normal limits  I-STAT VENOUS BLOOD GAS, ED - Abnormal; Notable for the following components:   Bicarbonate 31.9 (*)    TCO2 34 (*)    Acid-Base Excess 5.0 (*)    Potassium 3.3 (*)    Calcium , Ion 1.09 (*)    HCT 35.0 (*)    Hemoglobin 11.9 (*)    All other components within normal limits  I-STAT CG4 LACTIC ACID, ED - Abnormal; Notable for the following components:   Lactic Acid, Venous 2.1 (*)    All other components within normal limits  TROPONIN T, HIGH SENSITIVITY - Abnormal; Notable for the following components:   Troponin T High Sensitivity 43 (*)    All other components within normal limits  TROPONIN T, HIGH SENSITIVITY - Abnormal; Notable for the following components:   Troponin T High Sensitivity 43 (*)    All other components within normal limits      _________________________ Troponin  43- 43  ECG: Ordered Personally reviewed and interpreted  by me showing: HR : 85 Rhythm: Atrial fibrillation Ventricular premature complex Right bundle branch block  QTC 563  BNP (last 3 results) Recent Labs    07/15/24 1227 07/21/24 1615 08/03/24 1200  BNP 653.6* 716.3* 674.3*     The recent clinical data is shown below. Vitals:   09/23/24 1140 09/23/24 1415 09/23/24 1609  BP: (!) 94/51 (!) 108/59 113/72  Pulse: 85 85 81  Resp: 20 16 16   Temp: 98.4 F (36.9 C)  97.6 F (36.4 C)  TempSrc: Oral  Oral  SpO2: 100% 100% 100%    WBC     Component Value Date/Time   WBC 10.3 09/23/2024 1418   LYMPHSABS 1.8 09/23/2024 1418   LYMPHSABS 2.0 04/23/2017 1012   MONOABS 1.2 (H) 09/23/2024 1418   EOSABS 0.5 09/23/2024 1418   EOSABS 0.3 04/23/2017 1012   BASOSABS 0.1 09/23/2024 1418   BASOSABS 0.1 04/23/2017 1012    Lactic Acid, Venous    Component Value Date/Time   LATICACIDVEN 2.1 (HH) 09/23/2024 1710     Lactic Acid, Venous    Component Value Date/Time   LATICACIDVEN 2.1 (HH) 09/23/2024 1710    Procalcitonin   Ordered      UA   no evidence of UTI      Urine analysis:    Component Value Date/Time   COLORURINE STRAW (A) 09/23/2024 1359   APPEARANCEUR CLEAR 09/23/2024 1359   LABSPEC 1.005 09/23/2024 1359   PHURINE 5.0 09/23/2024 1359   GLUCOSEU NEGATIVE 09/23/2024 1359   GLUCOSEU NEGATIVE 05/07/2023 1415   HGBUR NEGATIVE 09/23/2024 1359   HGBUR negative 09/05/2010 0810   BILIRUBINUR NEGATIVE 09/23/2024 1359   BILIRUBINUR NEG 03/20/2022 1121   KETONESUR NEGATIVE 09/23/2024 1359   PROTEINUR NEGATIVE 09/23/2024 1359   UROBILINOGEN 0.2 05/07/2023 1415   NITRITE NEGATIVE 09/23/2024 1359   LEUKOCYTESUR NEGATIVE 09/23/2024 1359      Susceptibility data from last 90 days. Collected Specimen Info Organism AMPICILLIN  AMPICILLIN /SULBACTAM CEFAZOLIN  (URINE) CEFEPIME CEFTRIAXONE  Ciprofloxacin  Ertapenem  Gentamicin  Susc lslt Meropenem  Nitrofurantoin Susc lslt Piperacillin + Tazobactam  09/08/24 Urine, Clean Catch Escherichia  coli  R  S R  R  R  R  S  S  S  S S   Collected Specimen Info Organism Trimethoprim/Sulfa  09/08/24 Urine, Clean Catch Escherichia coli  S      __________________________________________________________ Recent Labs  Lab 09/23/24 1418 09/23/24 1430  NA 141 141  141  K 3.4* 3.3*  3.3*  CO2 31  --   GLUCOSE 102* 101*  BUN 20 21  CREATININE 1.33* 1.40*  CALCIUM  8.4*  --   MG 1.9  --     Cr  * stable,  Up from baseline see below Lab Results  Component Value Date   CREATININE 1.40 (H) 09/23/2024   CREATININE 1.33 (H) 09/23/2024   CREATININE 1.50 (H) 09/14/2024    Recent Labs  Lab 09/23/24 1418  AST 40  ALT 22  ALKPHOS 180*  BILITOT 0.7  PROT 6.6  ALBUMIN  2.9*   Lab Results  Component Value Date   CALCIUM  8.4 (L) 09/23/2024   PHOS 3.3 07/01/2023        Plt: Lab Results  Component Value Date   PLT 312 09/23/2024      Recent Labs  Lab 09/23/24 1418 09/23/24 1430  WBC 10.3  --   NEUTROABS 6.7  --   HGB 11.0* 11.9*  11.9*  HCT 35.1* 35.0*  35.0*  MCV 86.0  --   PLT 312  --     HG/HCT  stable,     Component Value Date/Time  HGB 11.9 (L) 09/23/2024 1430   HGB 11.9 (L) 09/23/2024 1430   HGB 14.0 05/23/2023 1220   HCT 35.0 (L) 09/23/2024 1430   HCT 35.0 (L) 09/23/2024 1430   HCT 45.2 05/23/2023 1220   MCV 86.0 09/23/2024 1418   MCV 87 05/23/2023 1220    _______________________________________________ Hospitalist was called for admission for   Acute on chronic combined systolic and diastolic CHF   Frequent falls   Hypokalemia    The following Work up has been ordered so far:  Orders Placed This Encounter  Procedures   Culture, blood (routine x 2)   DG Chest Portable 1 View   DG Knee Complete 4 Views Right   CT Head Wo Contrast   CT Cervical Spine Wo Contrast   CT CHEST ABDOMEN PELVIS W CONTRAST   Urinalysis, w/ Reflex to Culture (Infection Suspected) -Urine, Clean Catch   Comprehensive metabolic panel   Pro Brain natriuretic peptide    CBC with Differential   Magnesium    ED Cardiac monitoring   Cardiac Monitoring - Continuous Indefinite   Consult to hospitalist   Inpatient consult to Cardiology   I-stat chem 8, ED   I-Stat Lactic Acid   I-Stat venous blood gas, ED   ED EKG   Admit to Inpatient (patient's expected length of stay will be greater than 2 midnights or inpatient only procedure)     OTHER Significant initial  Findings:  labs showing:     DM  labs:  HbA1C: Recent Labs    10/14/23 0729 08/04/24 0246  HGBA1C 6.8* 6.2*       CBG (last 3)  No results for input(s): GLUCAP in the last 72 hours.        Cultures:    Component Value Date/Time   SDES  09/08/2024 1114    URINE, CLEAN CATCH Performed at [facility], [address], [place], [place] [address]    SPECREQUEST  09/08/2024 1114    NONE Performed at [facility], [address] [place], [place] [address]    CULT (A) 09/08/2024 1114    >=100,000 COLONIES/mL ESCHERICHIA COLI Confirmed Extended Spectrum Beta-Lactamase Producer (ESBL).  In bloodstream infections from ESBL organisms, carbapenems are preferred over piperacillin/tazobactam. They are shown to have a lower risk of mortality.    REPTSTATUS 09/10/2024 FINAL 09/08/2024 1114     Radiological Exams on Admission: CT CHEST ABDOMEN PELVIS W CONTRAST Result Date: 09/23/2024 EXAM: CT CHEST, ABDOMEN AND PELVIS WITH CONTRAST 09/23/2024 03:33:22 PM TECHNIQUE: CT of the chest, abdomen and pelvis was performed with the administration of 75 mL iohexol  (OMNIPAQUE ) 350 MG/ML injection. Multiplanar reformatted images are provided for review. Automated exposure control, iterative reconstruction, and/or weight based adjustment of the mA/kV was utilized to reduce the radiation dose to as low as reasonably achievable. COMPARISON: 09/23/2024, 09/10/2024, 01/27/2024 CLINICAL HISTORY: Polytrauma, blunt. FINDINGS: CHEST: MEDIASTINUM AND LYMPH NODES: Moderate  cardiomegaly. Endovascular aortic valve replacement. Left chest pacemaker/automatic implantable cardioverter-defibrillator (AICD) with leads terminating in the right atrium, right ventricle, and coronary sinus. Coronary artery bypass graft (CABG) changes. Diffuse aortic atherosclerosis. Moderate narrowing of the proximal right subclavian artery due to a chunky calcified plaque (axial 6). The central airways are clear. No mediastinal, hilar or axillary lymphadenopathy. LUNGS AND PLEURA: Small right and trace left pleural effusions, decreased in size in the interim. Similar appearance of the scattered reticular opacities throughout the lungs. A couple of nodular opacities are noted in the medial left lower lobe. No focal  consolidation or pulmonary edema. No pneumothorax. ABDOMEN AND PELVIS: LIVER: Unremarkable. GALLBLADDER AND BILE DUCTS: Unremarkable. No biliary ductal dilatation. SPLEEN: No acute abnormality. PANCREAS: Mild pancreatic parenchymal fatty atrophy. ADRENAL GLANDS: No acute abnormality. KIDNEYS, URETERS AND BLADDER: Mild renal cortical atrophy bilaterally. No stones in the kidneys or ureters. No hydronephrosis. No perinephric or periureteral stranding. Urinary bladder is unremarkable. GI AND BOWEL: Stomach demonstrates no acute abnormality. Normal appendix. Descending and sigmoid colon diverticulosis. No changes of acute diverticulitis. There is no bowel obstruction. REPRODUCTIVE ORGANS: No acute abnormality. PERITONEUM AND RETROPERITONEUM: No ascites. No free air. VASCULATURE: Aorta is normal in caliber. Diffuse aortoiliac atherosclerosis. Severe stenoses at both the celiac and superior mesenteric artery (SMA) ostia from calcified plaque. ABDOMINAL AND PELVIS LYMPH NODES: No lymphadenopathy. BONES AND SOFT TISSUES: Diffuse osteopenia. Multilevel degenerative disc disease of the spine. Sternotomy wires. Subtle cortical irregularity along the right anterior 5th rib, which could represent a remote  fracture. Correlation with point tenderness recommended to exclude an acute buckle type fracture. No focal soft tissue abnormality. IMPRESSION: 1. Subtle cortical irregularity along the right anterior 5th rib, which could represent a remote fracture. Correlation with point tenderness recommended to exclude an acute, buckled type rib fracture. 2. Small right and trace left pleural effusions, decreased in size in the interim. A couple of nodular opacities noted in the left lower lobe, which may represent changes of a developing bronchial pneumonia or aspiration. 3. Descending and sigmoid colonic diverticulosis. No changes of acute diverticulitis. Electronically signed by: Rogelia Myers MD 09/23/2024 04:33 PM EST RP Workstation: [id number]   CT Head Wo Contrast Result Date: 09/23/2024 EXAM: CT HEAD WITHOUT CONTRAST 09/23/2024 03:33:22 PM TECHNIQUE: CT of the head was performed without the administration of intravenous contrast. Automated exposure control, iterative reconstruction, and/or weight based adjustment of the mA/kV was utilized to reduce the radiation dose to as low as reasonably achievable. COMPARISON: Head CT 10/27/2020. CLINICAL HISTORY: Head trauma, minor (Age 38 years or older). Mechanical fall on blood thinners. FINDINGS: BRAIN AND VENTRICLES: There is no evidence of an acute infarct, intracranial hemorrhage, mass, midline shift, hydrocephalus, or extra-axial fluid collection. There is mild cerebral atrophy. Patchy to confluent hypodensities in the cerebral white matter bilaterally have progressed and are nonspecific but compatible with moderate chronic small vessel ischemic disease. There are small chronic bilateral cerebellar infarcts. Calcified atherosclerosis at the skull base. ORBITS: Bilateral cataract extraction. SINUSES: No acute abnormality. SOFT TISSUES AND SKULL: No acute soft tissue abnormality. No skull fracture. IMPRESSION: 1. No acute intracranial abnormality. 2. Moderate chronic small  vessel ischemic disease. 3. Small chronic bilateral cerebellar infarcts. 4. Cerebral Atrophy (ICD10-G31.9). Electronically signed by: Dasie Hamburg MD 09/23/2024 04:06 PM EST RP Workstation: [id number]   CT Cervical Spine Wo Contrast Result Date: 09/23/2024 EXAM: CT CERVICAL SPINE WITH CONTRAST 09/23/2024 03:33:22 PM TECHNIQUE: CT of the cervical spine was performed with the administration of intravenous contrast (75 mL iohexol  (OMNIPAQUE ) 350 MG/ML injection). Multiplanar reformatted images are provided for review. Automated exposure control, iterative reconstruction, and/or weight based adjustment of the mA/kV was utilized to reduce the radiation dose to as low as reasonably achievable. COMPARISON: None available. CLINICAL HISTORY: Neck trauma. Mechanical fall. On blood thinners. FINDINGS: BONES AND ALIGNMENT: Straightening of the normal cervical lordosis. No significant listhesis. No acute fracture or suspicious lesion. DEGENERATIVE CHANGES: Moderate diffuse cervical disc degeneration and advanced facet arthrosis. Bilateral facet and partial interbody ankylosis at C2-C3. Moderate to severe left neural foraminal stenosis at C3-C4. No evidence of high grade  spinal canal stenosis. SOFT TISSUES: No prevertebral soft tissue swelling. IMPRESSION: 1. No acute cervical spine fracture or traumatic malalignment. Electronically signed by: Dasie Hamburg MD 09/23/2024 04:03 PM EST RP Workstation: [id number]   DG Knee Complete 4 Views Right Result Date: 09/23/2024 EXAM: 4 VIEW(S) XRAY OF THE RIGHT KNEE 09/23/2024 01:53:00 PM COMPARISON: None available. CLINICAL HISTORY: Right knee pain and swelling. 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 Resurfaced patella with increased sclerosis and stable inferior pole patchy lucency with increased spur formation and possible increased heterotopic ossification along the proximal patellar tendon. Small joint effusion. No acute fracture. No malalignment. SOFT  TISSUES: Vascular calcifications. Diffuse soft tissue swelling. IMPRESSION: 1. Small joint effusion and diffuse soft tissue swelling. 2. 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 3. Resurfaced patella with increased sclerosis, stable inferior pole patchy lucency, increased spur formation, and possible increased heterotopic ossification along the proximal patellar tendon. 4. Vascular calcifications. Electronically signed by: Ryan Salvage MD 09/23/2024 02:58 PM EST RP Workstation: [id number]   DG Chest Portable 1 View Result Date: 09/23/2024 CLINICAL DATA:  Shortness of breath. EXAM: PORTABLE CHEST 1 VIEW COMPARISON:  Chest radiograph dated 09/07/2024. FINDINGS: Cardiomegaly with vascular congestion. No focal consolidation, pleural effusion or pneumothorax. Left pectoral AICD device. Median sternotomy wires. No acute osseous pathology. IMPRESSION: Cardiomegaly with vascular congestion. Electronically Signed   By: Vanetta Chou M.D.   On: 09/23/2024 14:47   _______________________________________________________________________________________________________ Latest  Blood pressure 113/72, pulse 81, temperature 97.6 F (36.4 C), temperature source Oral, resp. rate 16, SpO2 100%.   Vitals  labs and radiology finding personally reviewed  Review of Systems:    Pertinent positives include:   fatigue,   Constitutional:  No weight loss, night sweats, Fevers, chills,weight loss  HEENT:  No headaches, Difficulty swallowing,Tooth/dental problems,Sore throat,  No sneezing, itching, ear ache, nasal congestion, post nasal drip,  Cardio-vascular:  No chest pain, Orthopnea, PND, anasarca, dizziness, palpitations.no Bilateral lower extremity swelling  GI:  No heartburn, indigestion, abdominal pain, nausea, vomiting, diarrhea, change in bowel habits, loss of appetite, melena, blood in stool, hematemesis Resp:  no shortness of breath at rest. No dyspnea on exertion, No excess mucus, no productive cough, No  non-productive cough, No coughing up of blood.No change in color of mucus.No wheezing. Skin:  no rash or lesions. No jaundice GU:  no dysuria, change in color of urine, no urgency or frequency. No straining to urinate.  No flank pain.  Musculoskeletal:  No joint pain or no joint swelling. No decreased range of motion. No back pain.  Psych:  No change in mood or affect. No depression or anxiety. No memory loss.  Neuro: no localizing neurological complaints, no tingling, no weakness, no double vision, no gait abnormality, no slurred speech, no confusion  All systems reviewed and apart from HOPI all are negative _______________________________________________________________________________________________ Past Medical History:   Past Medical History:  Diagnosis Date   Age-related macular degeneration, dry, both eyes    Allergic    24/7; 365 days/year; I'm allergic to pollens, dust, all southern grasses/trees, mold, mildue, cats, dogs (01/07/2017)   Anal fissure    Asthma    sees Dr. Alaine    Benign prostatic hypertrophy    (sees Dr. Colan   CAD (coronary artery disease)    a. s/p CABG 2007. b. Cath 08/2016 - 4/4 patent grafts.   Carotid bruit    carotid u/s 10/10: 0.39% bilaterally   Chronic combined systolic and diastolic CHF (congestive heart failure) (HCC)  Complication of anesthesia 1980s   w/anal cyst OR, he gave me a saddle block then put a narcotic in spinal cord; had a severe reaction to that (01/07/2017)   Congestive heart failure (CHF) (HCC)    Coronavirus infection 11/18/2023   ED (erectile dysfunction)    Family history of adverse reaction to anesthesia    daughter wakes up during OR (01/07/2017)   GERD (gastroesophageal reflux disease)    Gout    HTN (hypertension)    Hx of colonic polyps    (sees Dr. Abran)   Hyperlipidemia    Hypothyroidism    Moderate to severe aortic stenosis    a. s/p TAVR 02/2017.   Myocardial infarction [facility]) ~ 2000   Obesity     Osteoarthritis    was in my knees, hands (01/07/2017 )   PAF (paroxysmal atrial fibrillation) (HCC)    a. documented post TAVR.   Precancerous skin lesion    (sees Dr. 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   29 mm Edwards Sapien 3 transcatheter heart valve placed via percutaneous right transfemoral approach      Past Surgical History:  Procedure Laterality Date   ANUS SURGERY     opened it back up cause it wouldn't heal; wound up w/a fissure (01/07/2017)   BIV ICD INSERTION CRT-D N/A 05/02/2017   Procedure: BIV ICD INSERTION CRT-D;  Surgeon: [name], MD;  Location: [facility] INVASIVE CV LAB;  Service: Cardiovascular;  Laterality: N/A;   CARDIAC CATHETERIZATION  10/29/2005   CARDIAC CATHETERIZATION N/A 08/27/2016   Procedure: Right/Left Heart Cath and Coronary/Graft Angiography;  Surgeon: Lonni JONETTA Cash, MD;  Location: [facility] INVASIVE CV LAB;  Service: Cardiovascular;  Laterality: N/A;   CARDIOVERSION N/A 04/24/2021   Procedure: CARDIOVERSION;  Surgeon: Okey Vina GAILS, MD;  Location: [facility] ENDOSCOPY;  Service: Cardiovascular;  Laterality: N/A;   CARDIOVERSION N/A 11/08/2021   Procedure: CARDIOVERSION;  Surgeon: Mona Vinie BROCKS, MD;  Location: [facility] ENDOSCOPY;  Service: Cardiovascular;  Laterality: N/A;   CARDIOVERSION N/A 02/06/2022   Procedure: CARDIOVERSION;  Surgeon: Delford Maude BROCKS, MD;  Location: [facility] ENDOSCOPY;  Service: Cardiovascular;  Laterality: N/A;   CATARACT EXTRACTION W/ INTRAOCULAR LENS  IMPLANT, BILATERAL Bilateral    CATARACT EXTRACTION, BILATERAL  2012   COLONOSCOPY  06/30/2008   no repeats needed    COLONOSCOPY     had 3 or 4 in the past    CORONARY ARTERY BYPASS GRAFT  2007   CABG X4   CYST EXCISION PERINEAL  1980s   HAMMER TOE SURGERY Bilateral    ICD GENERATOR CHANGEOUT N/A 05/30/2023   Procedure: ICD GENERATOR CHANGEOUT;  Surgeon: [name], MD;  Location: [facility]  INVASIVE CV LAB;  Service: Cardiovascular;  Laterality: N/A;   JOINT REPLACEMENT     KNEE ARTHROPLASTY  07/30/2011   Procedure: COMPUTER ASSISTED TOTAL KNEE ARTHROPLASTY;  Surgeon: Cordella Glendia Hutchinson;  Location: [facility] OR;  Service: Orthopedics;  Laterality: Left;  left total knee arthroplasty   MASTECTOMY SUBCUTANEOUS Bilateral    MULTIPLE TOOTH EXTRACTIONS     ORIF FINGER / THUMB FRACTURE Right ~ 1980   repair of thumb injury   PROSTATE BIOPSY     REPLACEMENT TOTAL KNEE BILATERAL Bilateral 2012   TEE WITHOUT CARDIOVERSION N/A 03/04/2017   Procedure: TRANSESOPHAGEAL ECHOCARDIOGRAM (TEE);  Surgeon: Cash Lonni JONETTA, MD;  Location: [facility] OR;  Service: Open Heart Surgery;  Laterality: N/A;   TOTAL KNEE REVISION Right 05/18/2019   Procedure: RIGHT PATELLA REVISION/REMOVAL;  Surgeon: Addie Cordella Hamilton, MD;  Location: [facility] OR;  Service: Orthopedics;  Laterality: Right;   TRANSCATHETER AORTIC VALVE REPLACEMENT, TRANSFEMORAL N/A 03/04/2017   Procedure: TRANSCATHETER AORTIC VALVE REPLACEMENT, TRANSFEMORAL;  Surgeon: [name], MD;  Location: [facility] OR;  Service: Open Heart Surgery;  Laterality: N/A;    Social History:  Ambulatory  walker       reports that he quit smoking about 63 years ago. His smoking use included cigarettes. He started smoking about 76 years ago. He has a 45.5 pack-year smoking history. He has never used smokeless tobacco. He reports that he does not currently use alcohol after a past usage of about 7.0 standard drinks of alcohol per week. He reports that he does not use drugs.     Family History:   Family History  Problem Relation Age of Onset   Heart attack Father 36   Allergic rhinitis Father    Asthma Father    Heart failure Mother 75   Uterine cancer Mother    Breast cancer Mother    Colon cancer Neg Hx    Esophageal cancer Neg Hx     ______________________________________________________________________________________________ Allergies: Allergies[1]   Prior to Admission medications  Medication Sig Start Date End Date Taking? Authorizing Provider  albuterol  (VENTOLIN  HFA) 108 (90 Base) MCG/ACT inhaler Inhale 2 puffs into the lungs every 4 (four) hours as needed for wheezing or shortness of breath. 05/06/23  Yes [name], MD  allopurinol  (ZYLOPRIM ) 100 MG tablet Take 1 tablet (100 mg total) by mouth daily. 09/18/23  Yes [name], MD  amiodarone  (PACERONE ) 200 MG tablet Take 1 tablet (200 mg total) by mouth daily. 01/14/24  Yes [name], MD  aspirin  81 MG chewable tablet Chew 81 mg by mouth daily.   Yes [provider]  atorvastatin  (LIPITOR) 20 MG tablet Take 20 mg by mouth daily.   Yes [provider]  benzonatate  (TESSALON ) 100 MG capsule Take 100 mg by mouth 3 (three) times daily.   Yes [provider]  cetirizine (ZYRTEC) 5 MG tablet Take 5 mg by mouth daily.   Yes [provider]  dabigatran  (PRADAXA ) 150 MG CAPS capsule TAKE (1) CAPSULE TWICE DAILY. Patient taking differently: Take 150 mg by mouth 2 (two) times daily. 10/24/23  Yes [name], MD  diclofenac  Sodium (VOLTAREN ) 1 % GEL Apply 4 g topically 3 (three) times daily as needed (pain). 01/26/24  Yes [name], MD  docusate sodium  (COLACE) 100 MG capsule Take 100 mg by mouth in the morning and at bedtime.   Yes [provider]  ferrous sulfate  325 (65 FE) MG tablet Take 1 tablet (325 mg total) by mouth daily with breakfast. 09/15/24  Yes Darci Pore, MD  fluticasone  (FLONASE ) 50 MCG/ACT nasal spray Place 2 sprays into both nostrils daily. 12/26/23  Yes [provider]  levothyroxine  (SYNTHROID ) 175 MCG tablet Take 175 mcg by mouth daily before breakfast.   Yes [provider]  metoCLOPramide  (REGLAN ) 10 MG tablet Take 10 mg by mouth 2 (two) times daily before  a meal.   Yes [provider]  metoprolol  succinate (TOPROL  XL) 25 MG 24 hr tablet Take 1 tablet (25 mg total) by mouth at bedtime. 09/15/24  Yes [name], [name], FNP  montelukast  (SINGULAIR ) 10 MG tablet TAKE ONE TABLET BY MOUTH ONCE DAILY IN THE EVENING 08/20/22  Yes Hunsucker, Donnice SAUNDERS, MD  Multiple Vitamins-Minerals (MULTIVITAMIN GUMMIES MENS) CHEW Chew 2 each by mouth daily.   Yes [provider]  oxyCODONE  (OXY IR/ROXICODONE ) 5 MG immediate release tablet Take 5 mg by mouth every 8 (eight) hours as needed (knee pain).   Yes [provider]  pantoprazole  (PROTONIX ) 40 MG tablet Take 40 mg by mouth in the morning and at bedtime.   Yes [provider]  potassium chloride  SA (KLOR-CON  M) 20 MEQ tablet Take 4 tablets (80 mEq total) by mouth daily. 09/20/24  Yes Lee, Jordan, NP  tamsulosin  (FLOMAX ) 0.4 MG CAPS capsule Take 0.4 mg by mouth at bedtime.   Yes [provider]  Tiotropium Bromide  Monohydrate (SPIRIVA  RESPIMAT) 2.5 MCG/ACT AERS Inhale 2 puffs into the lungs daily. 02/17/24  Yes Hope Almarie ORN, NP  torsemide  (DEMADEX ) 100 MG tablet Take 1 tablet (100 mg total) by mouth daily. 09/20/24  Yes Lee, Jordan, NP  triamcinolone  cream (KENALOG ) 0.5 % Apply 1 Application topically as directed. Patient taking differently: Apply 1 Application topically 2 (two) times daily. Apply to back 08/06/24  Yes [provider]  venlafaxine  XR (EFFEXOR -XR) 37.5 MG 24 hr capsule Take 37.5 mg by mouth daily.   Yes [provider]  ertapenem  (INVANZ ) IVPB Inject 1 g into the vein in the morning and at bedtime. Patient not taking: Reported on 09/23/2024    [provider]  OXYGEN Inhale 2 L/min into the lungs continuous.    [provider]  predniSONE  (DELTASONE ) 20 MG tablet Take 40 mg by mouth daily with breakfast. Patient not taking: Reported on 09/23/2024    [provider]  UNABLE TO FIND Inhale 1 Device into the lungs  at bedtime. CPAP; Apply CPAP mask at bedtime.    [provider]    ___________________________________________________________________________________________________ Physical Exam:    09/23/2024    4:09 PM 09/23/2024    2:15 PM 09/23/2024   11:40 AM  Vitals with BMI  Systolic 113 108 94  Diastolic 72 59 51  Pulse 81 85 85     1. General:  in No  Acute distress    Chronically ill  -appearing 2. Psychological: Alert and   Oriented to self and situation 3. Head/ENT:   Moist  Mucous Membranes                          Head Non traumatic, neck supple                           Poor Dentition 4. SKIN: normal  Skin turgor,  Skin clean Dry and intact no rash    5. Heart: Regular rate and rhythm no  Murmur, no Rub or gallop 6. Lungs:  , no wheezes or crackles   7. Abdomen: Soft,  non-tender,  distended   obese  bowel sounds present 8. Lower extremities: no clubbing, cyanosis,  trace edema 9. Neurologically Grossly intact, moving all 4 extremities equally   10. MSK: Normal range of motion    Chart has been reviewed  ______________________________________________________________________________________________  Assessment/Plan  89 y.o. male with medical history significant of  AICD placement, status post TAVR, history of atrial fibrillation on Pradaxa , OSA on CPAP, history of left pleural effusion status post thoracentesis, gout, CKD, baseline creatinine 1.65, DMT2, macular degeneration, hypertension, coronary artery disease, and hypothyroidism, hx of CVA, ESBL urosepsis    Admitted for for  Acute on chronic combined systolic and diastolic CHF   Frequent falls   Hypokalemia   Present on Admission:  Acute on chronic combined systolic and diastolic CHF (congestive heart failure) (HCC)  Prolonged QT interval  History of transcatheter aortic valve replacement (TAVR)  Hypothyroidism  Chronic kidney disease, stage 3a (HCC)  Aspiration pneumonia (HCC)  Biventricular  implantable cardioverter-defibrillator (ICD) in situ  Atypical atrial flutter (HCC)  BPH with urinary obstruction  CAD, AUTOLOGOUS BYPASS GRAFT  COPD (chronic obstructive pulmonary disease) (HCC)  Essential hypertension  Hyperlipidemia  Hypokalemia  NSVT (nonsustained ventricular tachycardia) (HCC)     Prolonged QT interval - will monitor on tele avoid QT prolonging medications, rehydrate correct electrolytes   History of transcatheter aortic valve replacement (TAVR) Chronic stable  Hypothyroidism - Check TSH continue home medications Synthroid  at 175mcg po q day   Chronic kidney disease, stage 3a (HCC)  -chronic avoid nephrotoxic medications such as NSAIDs, Vanco Zosyn combo,  avoid hypotension, continue to follow renal function   Aspiration pneumonia (HCC) CT imaging worrisome for aspiration pneumonia Patient did not endorse any fevers or cough but he has been somewhat more fatigued no white blood cell count noted but he does have lactic acidosis.  For tonight cover with Unasyn  Doxycycline  (prolonged qtc) and order SLP consult check procalcitonin  Biventricular implantable cardioverter-defibrillator (ICD) in situ Can interrogate tomorrow  Atypical atrial flutter (HCC) Appreciate care urology input Patient has known history of permanent atrial fibrillation continue amiodarone  200 mg daily and Pradaxa  150 mg twice daily  BPH with urinary obstruction Check bladder scan Continue Flomax  0.4 mg daily  CAD, AUTOLOGOUS BYPASS GRAFT Continue Lipitor 20 mg daily hold aspirin  given falls hold Toprol  given soft blood pressures  COPD (chronic obstructive pulmonary disease) (HCC) Chronic stable continue home meds Continue oxygen  Essential hypertension Allow permissive hypertension for today  Hyperlipidemia Continue Lipitor 20 mg a day  Hypokalemia Will replete and recheck  NSVT (nonsustained ventricular tachycardia) (HCC) Continue amiodarone  200 mg daily  Type 2  diabetes mellitus (HCC) Order sliding scale  Vertigo PT OT evaluation CT head showed chronic bilateral cerebellar infarcts Patient is currently on maximal therapy   Other plan as per orders.  DVT prophylaxis:  pradaxa    Code Status: DNR/DNI as per patient   I had personally discussed CODE STATUS with patient  and paperwork ACP   none     Family Communication:   Family not at  Bedside    Diet heart healthy/diabetic   Disposition Plan:                             Back to current facility when stable     Following barriers for discharge:                                                         Electrolytes corrected                                                         Pain controlled with PO medications  Will need consultants to evaluate patient prior to discharge                              Consult Orders  (From admission, onward)           Start     Ordered   09/23/24 1814  Consult to hospitalist  Paged By Nichole sent18:13  Once       Provider:  (Not yet assigned)  Question Answer Comment  Place call to: [facility]   Reason for Consult Admit      09/23/24 1813                               Would benefit from PT/OT eval prior to DC  Ordered                   Swallow eval - SLP ordered                                       Transition of care consulted               Consults called: Cardiology   Admission status:  ED Disposition     ED Disposition  Admit   Condition  --   Comment  Hospital Area: [facility] [[id number]]  Level of Care: Progressive [102]  Admit to Progressive based on following criteria: GI, ENDOCRINE disease patients with GI bleeding, acute liver failure or pancreatitis, stable with diabetic ketoacidosis or thyrotoxicosis (hypothyroid) state.  May admit patient to [facility] or [facility] if equivalent level of care is available:: No  Diagnosis: Acute on  chronic combined systolic and diastolic CHF (congestive heart failure) [facility]) [[id number]]  Admitting Physician: Rhythm Gubbels [[id number]]  Attending Physician: Gunnar Hereford [[id number]]  Certification:: I certify this patient will need inpatient services for at least 2 midnights  Expected Medical Readiness: 09/25/2024             inpatient     I Expect 2 midnight stay secondary to severity of patient's current illness need for inpatient interventions justified by the following:  hemodynamic instability despite optimal treatment ( hypotension  hypoxia, )   Severe lab/radiological/exam abnormalities including:    Acute on chronic combined systolic and diastolic CHF   Frequent falls   Hypokalemia    and extensive comorbidities including: DM2    CHF  CKD  Chronic anticoagulation  That are currently affecting medical management.   I expect  patient to be hospitalized for 2 midnights requiring inpatient medical care.  Patient is at high risk for adverse outcome (such as loss of life or disability) if not treated.  Indication for inpatient stay as follows:   New or worsening hypoxia    Need for IV antibiotics,   Frequent labs    Level of care     tele  For  24H   Keashia Haskins 09/23/2024, 10:26 PM    [facility]     after 2 AM please page floor coverage   If 7AM-7PM, please contact the day team taking care of the patient using [email or web address]        [1]  Allergies Allergen Reactions   Peanut (Diagnostic) Anaphylaxis   Peanut-Containing Drug Products Anaphylaxis   Sulfa Antibiotics Anaphylaxis  Sulfonamide Derivatives Anaphylaxis   Eliquis  [Apixaban ] Other (See Comments)    Back/hip pain   Norvasc  [Amlodipine ] Swelling    *lower extremity swelling   Xarelto  [Rivaroxaban ] Other (See Comments)    Back/hip pain   Zestril  [Lisinopril ] Cough   Lactose Other (See Comments)    Unknown reaction   "

## 2024-09-23 NOTE — Assessment & Plan Note (Signed)
-  will monitor on tele avoid QT prolonging medications, rehydrate correct electrolytes ? ?

## 2024-09-23 NOTE — Assessment & Plan Note (Signed)
 Allow permissive hypertension for today ?

## 2024-09-23 NOTE — ED Notes (Addendum)
 Rachel lab tech was unsuccessful with 2nd blood culture collection. Will notify Doutova MD

## 2024-09-23 NOTE — Assessment & Plan Note (Addendum)
 Chronic stable continue home meds Continue oxygen

## 2024-09-23 NOTE — Assessment & Plan Note (Signed)
-  chronic avoid nephrotoxic medications such as NSAIDs, Vanco Zosyn combo,  avoid hypotension, continue to follow renal function

## 2024-09-23 NOTE — ED Notes (Signed)
 RN spoke to [employer] to assist with collecting 2nd set of blood cultures

## 2024-09-23 NOTE — Assessment & Plan Note (Signed)
 Continue Lipitor 20 mg daily hold aspirin  given falls hold Toprol  given soft blood pressures

## 2024-09-23 NOTE — ED Notes (Signed)
 Provider made aware that only 1 set of cultures were obtained.

## 2024-09-23 NOTE — Assessment & Plan Note (Signed)
 Appreciate care urology input Patient has known history of permanent atrial fibrillation continue amiodarone  200 mg daily and Pradaxa  150 mg twice daily

## 2024-09-24 ENCOUNTER — Inpatient Hospital Stay (HOSPITAL_COMMUNITY)

## 2024-09-24 DIAGNOSIS — I5020 Unspecified systolic (congestive) heart failure: Secondary | ICD-10-CM | POA: Diagnosis not present

## 2024-09-24 DIAGNOSIS — I5021 Acute systolic (congestive) heart failure: Secondary | ICD-10-CM

## 2024-09-24 DIAGNOSIS — I5043 Acute on chronic combined systolic (congestive) and diastolic (congestive) heart failure: Secondary | ICD-10-CM | POA: Diagnosis not present

## 2024-09-24 LAB — ECHOCARDIOGRAM COMPLETE
AR max vel: 2.64 cm2
AV Area VTI: 3.08 cm2
AV Area mean vel: 2.53 cm2
AV Mean grad: 5.5 mmHg
AV Peak grad: 8.9 mmHg
Ao pk vel: 1.49 m/s
Area-P 1/2: 3.51 cm2
Calc EF: 42.3 %
S' Lateral: 3.4 cm
Single Plane A2C EF: 48.1 %
Single Plane A4C EF: 40.2 %

## 2024-09-24 LAB — CBC
HCT: 35.3 % — ABNORMAL LOW (ref 39.0–52.0)
Hemoglobin: 10.9 g/dL — ABNORMAL LOW (ref 13.0–17.0)
MCH: 27.1 pg (ref 26.0–34.0)
MCHC: 30.9 g/dL (ref 30.0–36.0)
MCV: 87.8 fL (ref 80.0–100.0)
Platelets: 297 10*3/uL (ref 150–400)
RBC: 4.02 MIL/uL — ABNORMAL LOW (ref 4.22–5.81)
RDW: 18.5 % — ABNORMAL HIGH (ref 11.5–15.5)
WBC: 10.2 10*3/uL (ref 4.0–10.5)
nRBC: 0 % (ref 0.0–0.2)

## 2024-09-24 LAB — COMPREHENSIVE METABOLIC PANEL WITH GFR
ALT: 21 U/L (ref 0–44)
AST: 30 U/L (ref 15–41)
Albumin: 2.5 g/dL — ABNORMAL LOW (ref 3.5–5.0)
Alkaline Phosphatase: 132 U/L — ABNORMAL HIGH (ref 38–126)
Anion gap: 13 (ref 5–15)
BUN: 18 mg/dL (ref 8–23)
CO2: 29 mmol/L (ref 22–32)
Calcium: 8.3 mg/dL — ABNORMAL LOW (ref 8.9–10.3)
Chloride: 100 mmol/L (ref 98–111)
Creatinine, Ser: 1.26 mg/dL — ABNORMAL HIGH (ref 0.61–1.24)
GFR, Estimated: 55 mL/min — ABNORMAL LOW
Glucose, Bld: 106 mg/dL — ABNORMAL HIGH (ref 70–99)
Potassium: 3.3 mmol/L — ABNORMAL LOW (ref 3.5–5.1)
Sodium: 141 mmol/L (ref 135–145)
Total Bilirubin: 0.8 mg/dL (ref 0.0–1.2)
Total Protein: 6.2 g/dL — ABNORMAL LOW (ref 6.5–8.1)

## 2024-09-24 LAB — GLUCOSE, CAPILLARY
Glucose-Capillary: 114 mg/dL — ABNORMAL HIGH (ref 70–99)
Glucose-Capillary: 115 mg/dL — ABNORMAL HIGH (ref 70–99)
Glucose-Capillary: 122 mg/dL — ABNORMAL HIGH (ref 70–99)
Glucose-Capillary: 122 mg/dL — ABNORMAL HIGH (ref 70–99)
Glucose-Capillary: 144 mg/dL — ABNORMAL HIGH (ref 70–99)
Glucose-Capillary: 94 mg/dL (ref 70–99)

## 2024-09-24 LAB — LACTIC ACID, PLASMA
Lactic Acid, Venous: 2.5 mmol/L (ref 0.5–1.9)
Lactic Acid, Venous: 3 mmol/L (ref 0.5–1.9)

## 2024-09-24 LAB — VITAMIN B12: Vitamin B-12: 842 pg/mL (ref 180–914)

## 2024-09-24 LAB — RESP PANEL BY RT-PCR (RSV, FLU A&B, COVID)  RVPGX2
Influenza A by PCR: NEGATIVE
Influenza B by PCR: NEGATIVE
Resp Syncytial Virus by PCR: NEGATIVE
SARS Coronavirus 2 by RT PCR: NEGATIVE

## 2024-09-24 LAB — PHOSPHORUS: Phosphorus: 3.4 mg/dL (ref 2.5–4.6)

## 2024-09-24 LAB — CBG MONITORING, ED: Glucose-Capillary: 109 mg/dL — ABNORMAL HIGH (ref 70–99)

## 2024-09-24 LAB — TSH: TSH: 1.68 u[IU]/mL (ref 0.350–4.500)

## 2024-09-24 LAB — MAGNESIUM: Magnesium: 1.8 mg/dL (ref 1.7–2.4)

## 2024-09-24 LAB — STREP PNEUMONIAE URINARY ANTIGEN: Strep Pneumo Urinary Antigen: NEGATIVE

## 2024-09-24 MED ORDER — POLYETHYLENE GLYCOL 3350 17 G PO PACK: 17.0000 g | PACK | Freq: Every day | ORAL | Status: AC | PRN

## 2024-09-24 MED ORDER — ASPIRIN 81 MG PO CHEW
81.0000 mg | CHEWABLE_TABLET | Freq: Every day | ORAL | Status: DC
  Administered 2024-09-24: 81 mg via ORAL
  Filled 2024-09-24: qty 1

## 2024-09-24 MED ORDER — BISACODYL 10 MG RE SUPP: 10.0000 mg | Freq: Every day | RECTAL | Status: AC | PRN

## 2024-09-24 MED ORDER — PANTOPRAZOLE SODIUM 40 MG PO TBEC
40.0000 mg | DELAYED_RELEASE_TABLET | Freq: Two times a day (BID) | ORAL | Status: AC
  Administered 2024-09-24 – 2024-10-01 (×14): 40 mg via ORAL
  Filled 2024-09-24 (×15): qty 1

## 2024-09-24 MED ORDER — AMIODARONE HCL 200 MG PO TABS
200.0000 mg | ORAL_TABLET | Freq: Every day | ORAL | Status: DC
  Administered 2024-09-24 – 2024-09-27 (×4): 200 mg via ORAL
  Filled 2024-09-24 (×4): qty 1

## 2024-09-24 MED ORDER — TAMSULOSIN HCL 0.4 MG PO CAPS
0.4000 mg | ORAL_CAPSULE | Freq: Every day | ORAL | Status: DC
  Administered 2024-09-24 – 2024-09-29 (×6): 0.4 mg via ORAL
  Filled 2024-09-24 (×6): qty 1

## 2024-09-24 MED ORDER — MAGNESIUM SULFATE 2 GM/50ML IV SOLN
2.0000 g | Freq: Once | INTRAVENOUS | Status: AC
  Administered 2024-09-24: 2 g via INTRAVENOUS
  Filled 2024-09-24: qty 50

## 2024-09-24 MED ORDER — SODIUM CHLORIDE 0.9 % IV SOLN: 250.0000 mL | INTRAVENOUS | Status: AC | PRN

## 2024-09-24 MED ORDER — DABIGATRAN ETEXILATE MESYLATE 150 MG PO CAPS
150.0000 mg | ORAL_CAPSULE | Freq: Two times a day (BID) | ORAL | Status: AC
  Administered 2024-09-24 – 2024-09-30 (×14): 150 mg via ORAL
  Filled 2024-09-24 (×17): qty 1

## 2024-09-24 MED ORDER — FUROSEMIDE 10 MG/ML IJ SOLN: 80.0000 mg | Freq: Once | INTRAMUSCULAR | Status: DC

## 2024-09-24 MED ORDER — FUROSEMIDE 10 MG/ML IJ SOLN
80.0000 mg | Freq: Two times a day (BID) | INTRAMUSCULAR | Status: DC
  Administered 2024-09-24 – 2024-09-27 (×5): 80 mg via INTRAVENOUS
  Filled 2024-09-24 (×6): qty 8

## 2024-09-24 MED ORDER — ATORVASTATIN CALCIUM 10 MG PO TABS
20.0000 mg | ORAL_TABLET | Freq: Every day | ORAL | Status: DC
  Administered 2024-09-24 – 2024-09-30 (×7): 20 mg via ORAL
  Filled 2024-09-24 (×7): qty 2

## 2024-09-24 MED ORDER — ACETAMINOPHEN 650 MG RE SUPP: 650.0000 mg | Freq: Four times a day (QID) | RECTAL | Status: AC | PRN

## 2024-09-24 MED ORDER — PERFLUTREN LIPID MICROSPHERE
1.0000 mL | INTRAVENOUS | Status: AC | PRN
  Administered 2024-09-24: 2 mL via INTRAVENOUS

## 2024-09-24 MED ORDER — SPIRONOLACTONE 25 MG PO TABS
25.0000 mg | ORAL_TABLET | Freq: Every day | ORAL | Status: DC
  Administered 2024-09-24 – 2024-09-27 (×4): 25 mg via ORAL
  Filled 2024-09-24 (×4): qty 1

## 2024-09-24 MED ORDER — METOCLOPRAMIDE HCL 5 MG PO TABS
10.0000 mg | ORAL_TABLET | Freq: Two times a day (BID) | ORAL | Status: DC
  Administered 2024-09-24: 10 mg via ORAL
  Filled 2024-09-24: qty 2

## 2024-09-24 MED ORDER — DOCUSATE SODIUM 100 MG PO CAPS
100.0000 mg | ORAL_CAPSULE | Freq: Two times a day (BID) | ORAL | Status: DC
  Administered 2024-09-24 – 2024-09-30 (×13): 100 mg via ORAL
  Filled 2024-09-24 (×13): qty 1

## 2024-09-24 MED ORDER — POTASSIUM CHLORIDE CRYS ER 20 MEQ PO TBCR
40.0000 meq | EXTENDED_RELEASE_TABLET | Freq: Two times a day (BID) | ORAL | Status: AC
  Administered 2024-09-24 (×2): 40 meq via ORAL
  Filled 2024-09-24 (×2): qty 2

## 2024-09-24 MED ORDER — FUROSEMIDE 10 MG/ML IJ SOLN
80.0000 mg | Freq: Once | INTRAMUSCULAR | Status: AC
  Administered 2024-09-24: 80 mg via INTRAVENOUS
  Filled 2024-09-24: qty 8

## 2024-09-24 MED ORDER — SODIUM CHLORIDE 0.9% FLUSH
3.0000 mL | Freq: Two times a day (BID) | INTRAVENOUS | Status: AC
  Administered 2024-09-24 – 2024-10-01 (×15): 3 mL via INTRAVENOUS

## 2024-09-24 MED ORDER — ALLOPURINOL 100 MG PO TABS
100.0000 mg | ORAL_TABLET | Freq: Every day | ORAL | Status: DC
  Administered 2024-09-24 – 2024-09-30 (×7): 100 mg via ORAL
  Filled 2024-09-24 (×7): qty 1

## 2024-09-24 MED ORDER — VENLAFAXINE HCL ER 37.5 MG PO CP24
37.5000 mg | ORAL_CAPSULE | Freq: Every day | ORAL | Status: DC
  Administered 2024-09-24 – 2024-09-30 (×7): 37.5 mg via ORAL
  Filled 2024-09-24 (×7): qty 1

## 2024-09-24 MED ORDER — LEVOTHYROXINE SODIUM 75 MCG PO TABS
175.0000 ug | ORAL_TABLET | Freq: Every day | ORAL | Status: DC
  Administered 2024-09-24 – 2024-09-30 (×7): 175 ug via ORAL
  Filled 2024-09-24 (×7): qty 1

## 2024-09-24 MED ORDER — ALBUTEROL SULFATE (2.5 MG/3ML) 0.083% IN NEBU
2.5000 mg | INHALATION_SOLUTION | RESPIRATORY_TRACT | Status: AC | PRN
  Administered 2024-09-30: 2.5 mg via RESPIRATORY_TRACT
  Filled 2024-09-24: qty 3

## 2024-09-24 MED ORDER — MONTELUKAST SODIUM 10 MG PO TABS
10.0000 mg | ORAL_TABLET | Freq: Every day | ORAL | Status: DC
  Administered 2024-09-24 – 2024-09-29 (×6): 10 mg via ORAL
  Filled 2024-09-24 (×6): qty 1

## 2024-09-24 MED ORDER — FERROUS SULFATE 325 (65 FE) MG PO TABS
325.0000 mg | ORAL_TABLET | Freq: Every day | ORAL | Status: DC
  Administered 2024-09-24 – 2024-09-30 (×7): 325 mg via ORAL
  Filled 2024-09-24 (×7): qty 1

## 2024-09-24 MED ORDER — ACETAMINOPHEN 325 MG PO TABS: 650.0000 mg | ORAL_TABLET | Freq: Four times a day (QID) | ORAL | Status: AC | PRN

## 2024-09-24 MED ORDER — SODIUM CHLORIDE 0.9% FLUSH: 3.0000 mL | INTRAVENOUS | Status: AC | PRN

## 2024-09-24 MED ORDER — POTASSIUM CHLORIDE CRYS ER 20 MEQ PO TBCR: 20.0000 meq | EXTENDED_RELEASE_TABLET | Freq: Once | ORAL | Status: DC

## 2024-09-24 MED ORDER — HYDROCODONE-ACETAMINOPHEN 5-325 MG PO TABS
1.0000 | ORAL_TABLET | ORAL | Status: DC | PRN
  Filled 2024-09-24: qty 1

## 2024-09-24 NOTE — ED Notes (Signed)
 Doutova MD approved antibiotics before collection of 2nd blood culture due to pt being a hard stick

## 2024-09-24 NOTE — Evaluation (Signed)
 Physical Therapy Evaluation Patient Details Name: Justin [name] MRN: [id number] DOB: Oct 10, 1933 Today's Date: 09/24/2024  History of Present Illness  Pt is an 89 y.o. M presenting to [facility] on 09/23/24 following a fall on blood thinners. CT of abdomen shows small right and left pleural effusions. Pt admitted with acute on chronic systolic and diastolic CHF.  Clinical Impression  Prior to admission, pt was mod I with mobility, ambulating short distances with rollator and was needing assistance with bathing, dressing, toileting, and grooming. Pt presents to evaluation with deficits in mobility, power, strength, activity tolerance, and balance. Pt completed stand with moderate physical assistance with AD and then perform stand-step transfer to recliner with AD and minimal physical assistance. PT will continue to treat pt while he is admitted. Recommending follow-up PT at [facility] at D/C to address remaining mobility deficits and optimize return to PLOF.         If plan is discharge home, recommend the following: A lot of help with walking and/or transfers;A lot of help with bathing/dressing/bathroom;Assist for transportation   Can travel by private vehicle   No    Equipment Recommendations None recommended by PT  Recommendations for Other Services  OT consult    Functional Status Assessment Patient has had a recent decline in their functional status and demonstrates the ability to make significant improvements in function in a reasonable and predictable amount of time.     Precautions / Restrictions Precautions Precautions: Fall Recall of Precautions/Restrictions: Intact Restrictions Weight Bearing Restrictions Per Provider Order: No      Mobility  Bed Mobility Overal bed mobility: Needs Assistance             General bed mobility comments: received sitting EOB and left in recliner at end of session    Transfers Overall transfer level: Needs assistance Equipment  used: Rolling walker (2 wheels) Transfers: Sit to/from Stand, Bed to chair/wheelchair/BSC Sit to Stand: Mod assist, From elevated surface   Step pivot transfers: Min assist       General transfer comment: STS from elevated EOB with RW and moderate physical assistance for initial power-up. Pt then completed stand-step transfer to recliner on the L with RW and minimal physical assistance for stability. Pt demonstrates reduced knee flexion and ankle DF bilaterally, dragging feet to advance. Step by step cues for sequencing.    Ambulation/Gait                  Stairs            Wheelchair Mobility     Tilt Bed    Modified Rankin (Stroke Patients Only)       Balance Overall balance assessment: Needs assistance Sitting-balance support: No upper extremity supported, Feet supported Sitting balance-Leahy Scale: Good Sitting balance - Comments: seated EOB, able to perform reciprocal scoot towards EOB for foot flat position with no LOB or signs of instability   Standing balance support: Bilateral upper extremity supported, During functional activity, Reliant on assistive device for balance Standing balance-Leahy Scale: Poor Standing balance comment: reliant on external support for stability                             Pertinent Vitals/Pain Pain Assessment Pain Assessment: No/denies pain    Home Living Family/patient expects to be discharged to:: Assisted living                 Home Equipment: Rollator (4  wheels);Shower seat;Grab bars - toilet;Grab bars - tub/shower;Hand held shower head Additional Comments: Pt is resident of [facility]. Pt reports occasionally using oxygen on 2L at night time. Pt reporting he receives PT at the ALF.    Prior Function Prior Level of Function : Needs assist       Physical Assist : ADLs (physical)   ADLs (physical): Grooming;Bathing;Dressing;Toileting;IADLs Mobility Comments: pt reports utilizing rollator for  mobility when ambulating to dining hall and around his home. For longer distances, pt uses a power chair. Pt reports 4 falls within the past couple of weeks, 2 of them being sliding out of bed and 2 of them his legs gave out. ADLs Comments: Pt reports needing assistance with showering, LB dressing and all IADLs.     Extremity/Trunk Assessment   Upper Extremity Assessment Upper Extremity Assessment: Generalized weakness;Defer to OT evaluation    Lower Extremity Assessment Lower Extremity Assessment: Generalized weakness    Cervical / Trunk Assessment Cervical / Trunk Assessment: Kyphotic  Communication   Communication Communication: No apparent difficulties    Cognition Arousal: Alert Behavior During Therapy: WFL for tasks assessed/performed   PT - Cognitive impairments: No apparent impairments, No family/caregiver present to determine baseline                         Following commands: Intact       Cueing Cueing Techniques: Verbal cues, Gestural cues     General Comments General comments (skin integrity, edema, etc.): SpO2 92-96% on RA    Exercises     Assessment/Plan    PT Assessment Patient needs continued PT services  PT Problem List Decreased strength;Decreased activity tolerance;Decreased balance;Decreased mobility;Decreased coordination       PT Treatment Interventions DME instruction;Gait training;Functional mobility training;Therapeutic activities;Therapeutic exercise;Balance training;Patient/family education;Wheelchair mobility training;Manual techniques;Modalities    PT Goals (Current goals can be found in the Care Plan section)  Acute Rehab PT Goals Patient Stated Goal: To get back to [facility] PT Goal Formulation: With patient Time For Goal Achievement: 10/08/24 Potential to Achieve Goals: Good    Frequency Min 2X/week     Co-evaluation               AM-PAC PT 6 Clicks Mobility  Outcome Measure Help needed turning from  your back to your side while in a flat bed without using bedrails?: A Little Help needed moving from lying on your back to sitting on the side of a flat bed without using bedrails?: A Little Help needed moving to and from a bed to a chair (including a wheelchair)?: A Little Help needed standing up from a chair using your arms (e.g., wheelchair or bedside chair)?: A Lot Help needed to walk in hospital room?: Total Help needed climbing 3-5 steps with a railing? : Total 6 Click Score: 13    End of Session Equipment Utilized During Treatment: Gait belt Activity Tolerance: Patient tolerated treatment well Patient left: in chair;with call bell/phone within reach;with chair alarm set;with nursing/sitter in room Nurse Communication: Mobility status (+2 with AD for transfers) PT Visit Diagnosis: Unsteadiness on feet (R26.81);Other abnormalities of gait and mobility (R26.89);Muscle weakness (generalized) (M62.81);Difficulty in walking, not elsewhere classified (R26.2);History of falling (Z91.81)    Time: 9196-9169 PT Time Calculation (min) (ACUTE ONLY): 27 min   Charges:   PT Evaluation $PT Eval Moderate Complexity: 1 Mod   PT General Charges $$ ACUTE PT VISIT: 1 Visit  Leontine Hilt DPT Acute Rehab Services [phone] Prefer contact via chat   Leontine NOVAK Schwanda Zima 09/24/2024, 9:06 AM

## 2024-09-24 NOTE — ED Notes (Signed)
 RN on 3E ready for patient

## 2024-09-24 NOTE — Consult Note (Signed)
 "                                                  Palliative Care Consult Note                                  Date: 09/24/2024   Patient Name: IMANUEL PRUIETT  DOB: August 28, 1933  MRN: [id number]  Age / Sex: 89 y.o., male  PCP: Mast, Man X, NP Referring Physician: Fairy Frames, MD  Reason for Consultation: Establishing goals of care  HPI/Patient Profile: 89 y.o. male  with past medical history of chronic HFrEF, permanent atrial fibrillation, aortic stenosis s/p TAVR in 2018, Medtronic BiV ICD, CKD 3, and CAD s/p CABG in 2007.  He presented to the ED on 09/23/2024 with weakness, recurrent falls, and orthostatic symptoms.  He is admitted with acute on chronic HFrEF.  Past Medical History:  Diagnosis Date   Age-related macular degeneration, dry, both eyes    Allergic    24/7; 365 days/year; I'm allergic to pollens, dust, all southern grasses/trees, mold, mildue, cats, dogs (01/07/2017)   Anal fissure    Asthma    sees Dr. Alaine    Benign prostatic hypertrophy    (sees Dr. Colan   CAD (coronary artery disease)    a. s/p CABG 2007. b. Cath 08/2016 - 4/4 patent grafts.   Carotid bruit    carotid u/s 10/10: 0.39% bilaterally   Chronic combined systolic and diastolic CHF (congestive heart failure) (HCC)    Complication of anesthesia 1980s   w/anal cyst OR, he gave me a saddle block then put a narcotic in spinal cord; had a severe reaction to that (01/07/2017)   Congestive heart failure (CHF) (HCC)    Coronavirus infection 11/18/2023   ED (erectile dysfunction)    Family history of adverse reaction to anesthesia    daughter wakes up during OR (01/07/2017)   GERD (gastroesophageal reflux disease)    Gout    HTN (hypertension)    Hx of colonic polyps    (sees Dr. Abran)   Hyperlipidemia    Hypothyroidism    Moderate to severe aortic stenosis    a. s/p TAVR 02/2017.   Myocardial infarction [facility]) ~ 2000   Obesity    Osteoarthritis    was in my knees,  hands (01/07/2017 )   PAF (paroxysmal atrial fibrillation) (HCC)    a. documented post TAVR.   Precancerous skin lesion    (sees Dr. 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 Patient and family are faced with anticipatory care needs and complex medical decision making.   Clinical Assessment and Goals of Care:   Extensive chart review has been completed prior to meeting with patient/family including labs, vital signs, imaging, progress/consult notes, orders, medications and available advance directive documents.    I met with *** to discuss diagnosis, prognosis, GOC, EOL wishes, disposition, and options.  I introduced Palliative Medicine as specialized medical care for people living with serious illness. It focuses on  providing relief from the symptoms and stress of a serious illness.   Created space and opportunity for patient and family to express thoughts and feelings regarding current medical situation. Values and goals of care were attempted to be elicited.  A discussion was had today regarding advanced directives. We discussed code status and scopes of care. We discussed the difference between full scope versus limited interventions versus comfort care. The MOST form was introduced and discussed.   Life Review: ***  Functional Status: ***  Patient/Family Understanding of Illness: ***  Discussion: *** We discussed patient's current illness and what it means in the larger context of his/her ongoing co-morbidities. Current clinical status was reviewed. Natural disease trajectory of *** was discussed.  Discussed that patient's heart failure is advanced and concerns that he may be approaching end-stage.   Patient acknowledges feeling that his heart failure is worsening and is refractory to management.  Discussed the importance of continued conversation with the medical team regarding overall plan of care and treatment options, ensuring decisions are within the context of the patients values and GOCs.  Questions and concerns addressed. Patient/family encouraged to call with questions or concerns.   Review of Systems  Objective:   Primary Diagnoses: Present on Admission:  Acute on chronic combined systolic and diastolic CHF (congestive heart failure) (HCC)  Prolonged QT interval  History of transcatheter aortic valve replacement (TAVR)  Hypothyroidism  Chronic kidney disease, stage 3a (HCC)  Aspiration pneumonia (HCC)  Biventricular implantable cardioverter-defibrillator (ICD) in situ  Atypical atrial flutter (HCC)  BPH with urinary obstruction  CAD, AUTOLOGOUS BYPASS GRAFT  COPD (chronic obstructive pulmonary disease) (HCC)  Essential hypertension  Hyperlipidemia  Hypokalemia  NSVT (nonsustained ventricular tachycardia) (HCC)   Physical Exam  Vital Signs:  BP 122/61 (BP Location: Right Arm)   Pulse 98   Temp (!) 97.3 F (36.3 C) (Oral)   Resp 20   SpO2 99%   Palliative Assessment/Data: ***     Assessment & Plan:   SUMMARY OF RECOMMENDATIONS   ***  Primary Decision Maker: {Primary Decision Fjxzm:78612}  Existing Vynca/ACP Documentation:   Code Status/Advance Care Planning: {Palliative Code status:23503}  Symptom Management:  ***  Prognosis:  {Palliative Care Prognosis:23504}  Discharge Planning:  {Palliative dispostion:23505}   Discussed with: ***    Thank you for allowing us  to participate in the care of Jsoeph W Hussey   Time Total: ***  Greater than 50%  of this time was spent counseling and coordinating care related to the above assessment and plan.  Signed by: Recardo Loll, NP Palliative Medicine Team  Team Phone  # [phone]  For individual providers, please see AMION                "

## 2024-09-24 NOTE — Progress Notes (Signed)
 Heart Failure Navigator Progress Note  Assessed for Heart & Vascular TOC clinic readiness.  Patient does not meet criteria due to he has a Advanced Heart Failure Team consult. .   Navigator will sign off at this time.   Stephane Haddock, BSN, Scientist, Clinical (histocompatibility And Immunogenetics) Only

## 2024-09-24 NOTE — Plan of Care (Signed)
   Problem: Coping: Goal: Ability to adjust to condition or change in health will improve Outcome: Progressing

## 2024-09-24 NOTE — TOC Initial Note (Addendum)
 Transition of Care [facility]) - Initial/Assessment Note    Patient Details  Name: Justin [name] MRN: [id number] Date of Birth: 07/02/1934  Transition of Care [facility] CM/SW Contact:    Arlana JINNY Nicholaus [place] Phone Number: [phone] 09/24/2024, 2:48 PM  Clinical Narrative:  HF CSW met with patient at bedside. Patient stated that he and his wife are from [facility]. Patient stated that has great support form his daughter. Patient stated that [facility] provides [facility] services and equipment. Patient stated that his PCP is on site.   HF CSW received a call from Amber at the patients ALF. Amber stated that they have some concerns with the patients weakness and they are considering dispo to SNF vs ALF. Amber will follow up with CSW on Monday.   HF CSW/CM will continue to follow and monitor for dc readiness.                  Expected Discharge Plan: Assisted Living ([facility]) Barriers to Discharge: Continued Medical Work up   Patient Goals and CMS Choice Patient states their goals for this hospitalization and ongoing recovery are:: Return to [facility] ALF CMS Medicare.gov Compare Post Acute Care list provided to:: Patient Choice offered to / list presented to : Patient [place] ownership interest in [facility].provided to:: Patient    Expected Discharge Plan and Services       Living arrangements for the past 2 months: Assisted Living Facility ([facility])                                      Prior Living Arrangements/Services Living arrangements for the past 2 months: Assisted Living Facility ([facility]) Lives with:: Facility Resident Patient language and need for interpreter reviewed:: Yes Do you feel safe going back to the place where you live?: Yes      Need for Family Participation in Patient Care: Yes (Comment) Care giver support system in place?: Yes (comment)   Criminal Activity/Legal Involvement Pertinent to Current  Situation/Hospitalization: No - Comment as needed  Activities of Daily Living   ADL Screening (condition at time of admission) Independently performs ADLs?: No Does the patient have a NEW difficulty with bathing/dressing/toileting/self-feeding that is expected to last >3 days?: Yes (Initiates electronic notice to provider for possible OT consult) Does the patient have a NEW difficulty with getting in/out of bed, walking, or climbing stairs that is expected to last >3 days?: Yes (Initiates electronic notice to provider for possible PT consult) Does the patient have a NEW difficulty with communication that is expected to last >3 days?: No Is the patient deaf or have difficulty hearing?: No Does the patient have difficulty seeing, even when wearing glasses/contacts?: Yes Does the patient have difficulty concentrating, remembering, or making decisions?: No  Permission Sought/Granted Permission sought to share information with : Case Manager, Family Supports, PCP Permission granted to share information with : Yes, Verbal Permission Granted  Share Information with NAME: Raekwan, [place] Daughter, Emergency Contact [phone]  Permission granted to share info w AGENCY: [facility]        Emotional Assessment Appearance:: Appears stated age Attitude/Demeanor/Rapport: Engaged Affect (typically observed): Appropriate Orientation: : Oriented to Self, Oriented to Place, Oriented to  Time, Oriented to Situation Alcohol  / Substance Use: Not Applicable Psych Involvement: No (comment)  Admission diagnosis:  Hypokalemia [E87.6] Elevated brain natriuretic peptide (BNP) level [R79.89] Generalized  weakness [R53.1] Frequent falls [R29.6] Acute on chronic combined systolic and diastolic CHF (congestive heart failure) (HCC) [I50.43] Patient Active Problem List   Diagnosis Date Noted   Acute on chronic combined systolic and diastolic CHF (congestive heart failure) (HCC) 09/23/2024   Prolonged QT  interval 09/23/2024   Aspiration pneumonia (HCC) 09/23/2024   Iron deficiency anemia 09/14/2024   Severe sepsis (HCC) 09/09/2024   AKI (acute kidney injury) 09/08/2024   Hypokalemia 09/07/2024   Orthostatic hypotension 08/03/2024   Acute renal failure superimposed on stage 3a chronic kidney disease (HCC) 08/03/2024   Mild cognitive impairment 08/02/2024   Dry skin dermatitis 07/30/2024   COPD (chronic obstructive pulmonary disease) (HCC) 05/27/2024   Hyponatremia 02/16/2024   E. coli UTI 12/19/2023   Recurrent depression 12/19/2023   Candidiasis 12/15/2023   Pleural effusion 11/20/2023   Asymptomatic bacteriuria 11/20/2023   Bronchitis 11/06/2023   Type 2 diabetes mellitus (HCC) 10/23/2023   Leukocytosis 10/16/2023   Right ventricular dysfunction 07/05/2023   Chronic kidney disease, stage 3a (HCC) 07/05/2023   Persistent atrial fibrillation (HCC) 07/05/2023   Long term (current) use of anticoagulants 07/05/2023   NSVT (nonsustained ventricular tachycardia) (HCC) 05/23/2022   Sinus node dysfunction (HCC) 05/23/2022   Frequent PVCs 05/23/2022   Biventricular implantable cardioverter-defibrillator (ICD) in situ 03/12/2022   Atypical atrial flutter (HCC) 01/30/2022   Secondary hypercoagulable state 04/19/2021   Raynaud's phenomenon without gangrene 07/11/2020   Vertigo 07/11/2020   Loosening of knee joint prosthesis 05/18/2019   Cough 04/30/2019   Gastroesophageal reflux disease 04/30/2019   S/P ICD (internal cardiac defibrillator) procedure 05/03/2017   NICM (nonischemic cardiomyopathy) (HCC) 05/02/2017   History of transcatheter aortic valve replacement (TAVR) 03/04/2017   Severe aortic valve stenosis 03/04/2017   Hypokalemia due to loss of potassium with diuresis 01/10/2017   Aortic stenosis 01/10/2017   Chronic systolic CHF (congestive heart failure) (HCC) 01/10/2017   Chronic congestive heart failure (HCC) 01/07/2017   Hypothyroidism 12/23/2016   Prostate cancer (HCC)     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   Edema of both legs 09/01/2014   Asthma, intermittent 09/01/2014   Epiphora due to insufficient drainage 07/02/2011   Rosacea blepharoconjunctivitis 07/02/2011   Coronary artery disease involving native coronary artery of native heart without angina pectoris    GOUT 09/12/2009   CAD, AUTOLOGOUS BYPASS GRAFT 05/10/2009   MURMUR 05/08/2009   BRUIT 05/08/2009   MUSCLE CRAMPS 12/12/2008   KNEE SPRAIN 09/21/2008   Osteoarthritis 07/07/2008   BPH with urinary obstruction 02/01/2008   LOW BACK PAIN SYNDROME 02/01/2008   TESTOSTERONE DEFICIENCY 07/03/2007   Hyperlipidemia 07/03/2007   Essential hypertension 07/03/2007   MYOCARDIAL INFARCTION, HX OF 07/03/2007   Allergic rhinitis 07/03/2007   History of colonic polyps 07/03/2007   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   PCP:  Mast, Man X, NP Pharmacy:  No Pharmacies Listed    Social Drivers of Health (SDOH) Social History: SDOH Screenings   Food Insecurity: No Food Insecurity (09/24/2024)  Housing: Low Risk (09/24/2024)  Transportation Needs: No Transportation Needs (09/24/2024)  Utilities: Not At Risk (09/24/2024)  Alcohol  Screen: Low Risk (03/31/2023)  Depression (PHQ2-9): Low Risk (04/05/2024)  Financial Resource Strain: Low Risk (03/31/2023)  Physical Activity: Sufficiently Active (03/31/2023)  Social Connections: Unknown (09/24/2024)  Stress: No Stress Concern Present (03/31/2023)  Tobacco Use: Medium Risk (09/23/2024)  Health Literacy: Adequate Health Literacy (03/31/2023)   SDOH Interventions:     Readmission Risk Interventions    09/12/2024   10:13 AM 08/05/2024  11:56 AM 11/19/2023    1:00 PM  Readmission Risk Prevention Plan  Transportation Screening Complete Complete Complete  PCP or Specialist Appt within 3-5 Days   Complete  HRI or Home Care Consult   Complete  Social Work Consult for Recovery Care Planning/Counseling   Complete  Palliative Care Screening   Not Applicable  Medication Review Special Educational Needs Teacher) Complete Complete Complete  PCP or Specialist appointment within 3-5 days of discharge Complete Complete   HRI or Home Care Consult Complete Complete   SW Recovery Care/Counseling Consult Complete Complete   Palliative Care Screening Not Applicable Not Applicable   Skilled Nursing Facility Complete Not Applicable

## 2024-09-24 NOTE — Progress Notes (Signed)
 Orthopedic Tech Progress Note Patient Details:  Justin [name] 1933/08/29 [id number]  Ortho Devices Type of Ortho Device: Nonie boot Ortho Device/Splint Location: BLE Ortho Device/Splint Interventions: Ordered, Application, Adjustment   Post Interventions Patient Tolerated: Well Instructions Provided: Care of device  Ramsey Guadamuz [name] 09/24/2024, 6:04 PM

## 2024-09-24 NOTE — Evaluation (Signed)
 Clinical/Bedside Swallow Evaluation Patient Details  Name: Justin [name] MRN: [id number] Date of Birth: 05-20-1934  Today's Date: 09/24/2024 Time: SLP Start Time (ACUTE ONLY): 1350 SLP Stop Time (ACUTE ONLY): 1401 SLP Time Calculation (min) (ACUTE ONLY): 11 min  Past Medical History:  Past Medical History:  Diagnosis Date   Age-related macular degeneration, dry, both eyes    Allergic    24/7; 365 days/year; I'm allergic to pollens, dust, all southern grasses/trees, mold, mildue, cats, dogs (01/07/2017)   Anal fissure    Asthma    sees Dr. Alaine    Benign prostatic hypertrophy    (sees Dr. Colan   CAD (coronary artery disease)    a. s/p CABG 2007. b. Cath 08/2016 - 4/4 patent grafts.   Carotid bruit    carotid u/s 10/10: 0.39% bilaterally   Chronic combined systolic and diastolic CHF (congestive heart failure) (HCC)    Complication of anesthesia 1980s   w/anal cyst OR, he gave me a saddle block then put a narcotic in spinal cord; had a severe reaction to that (01/07/2017)   Congestive heart failure (CHF) (HCC)    Coronavirus infection 11/18/2023   ED (erectile dysfunction)    Family history of adverse reaction to anesthesia    daughter wakes up during OR (01/07/2017)   GERD (gastroesophageal reflux disease)    Gout    HTN (hypertension)    Hx of colonic polyps    (sees Dr. Abran)   Hyperlipidemia    Hypothyroidism    Moderate to severe aortic stenosis    a. s/p TAVR 02/2017.   Myocardial infarction [facility]) ~ 2000   Obesity    Osteoarthritis    was in my knees, hands (01/07/2017 )   PAF (paroxysmal atrial fibrillation) (HCC)    a. documented post TAVR.   Precancerous skin lesion    (sees Dr. 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   29 mm Edwards Sapien 3 transcatheter heart valve placed via percutaneous right transfemoral approach   Past Surgical History:  Past  Surgical History:  Procedure Laterality Date   ANUS SURGERY     opened it back up cause it wouldn't heal; wound up w/a fissure (01/07/2017)   BIV ICD INSERTION CRT-D N/A 05/02/2017   Procedure: BIV ICD INSERTION CRT-D;  Surgeon: [name], MD;  Location: [facility] INVASIVE CV LAB;  Service: Cardiovascular;  Laterality: N/A;   CARDIAC CATHETERIZATION  10/29/2005   CARDIAC CATHETERIZATION N/A 08/27/2016   Procedure: Right/Left Heart Cath and Coronary/Graft Angiography;  Surgeon: Lonni JONETTA Cash, MD;  Location: [facility] INVASIVE CV LAB;  Service: Cardiovascular;  Laterality: N/A;   CARDIOVERSION N/A 04/24/2021   Procedure: CARDIOVERSION;  Surgeon: Okey Vina GAILS, MD;  Location: [facility] ENDOSCOPY;  Service: Cardiovascular;  Laterality: N/A;   CARDIOVERSION N/A 11/08/2021   Procedure: CARDIOVERSION;  Surgeon: Mona Vinie BROCKS, MD;  Location: [facility] ENDOSCOPY;  Service: Cardiovascular;  Laterality: N/A;   CARDIOVERSION N/A 02/06/2022   Procedure: CARDIOVERSION;  Surgeon: Delford Maude BROCKS, MD;  Location: [facility] ENDOSCOPY;  Service: Cardiovascular;  Laterality: N/A;   CATARACT EXTRACTION W/ INTRAOCULAR LENS  IMPLANT, BILATERAL Bilateral    CATARACT EXTRACTION, BILATERAL  2012   COLONOSCOPY  06/30/2008   no repeats needed    COLONOSCOPY     had 3 or 4 in the past    CORONARY ARTERY BYPASS GRAFT  2007   CABG X4  CYST EXCISION PERINEAL  1980s   HAMMER TOE SURGERY Bilateral    ICD GENERATOR CHANGEOUT N/A 05/30/2023   Procedure: ICD GENERATOR CHANGEOUT;  Surgeon: [name], MD;  Location: [facility] INVASIVE CV LAB;  Service: Cardiovascular;  Laterality: N/A;   JOINT REPLACEMENT     KNEE ARTHROPLASTY  07/30/2011   Procedure: COMPUTER ASSISTED TOTAL KNEE ARTHROPLASTY;  Surgeon: Cordella Glendia Hutchinson;  Location: [facility] OR;  Service: Orthopedics;  Laterality: Left;  left total knee arthroplasty   MASTECTOMY SUBCUTANEOUS Bilateral    MULTIPLE TOOTH EXTRACTIONS     ORIF FINGER / THUMB FRACTURE Right ~ 1980   repair of thumb injury    PROSTATE BIOPSY     REPLACEMENT TOTAL KNEE BILATERAL Bilateral 2012   TEE WITHOUT CARDIOVERSION N/A 03/04/2017   Procedure: TRANSESOPHAGEAL ECHOCARDIOGRAM (TEE);  Surgeon: [name], MD;  Location[facility] OR;  Service: Open Heart Surgery;  Laterality: N/A;   TOTAL KNEE REVISION Right 05/18/2019   Procedure: RIGHT PATELLA REVISION/REMOVAL;  Surgeon: Hutchinson Cordella Glendia, MD;  Location: [facility] OR;  Service: Orthopedics;  Laterality: Right;   TRANSCATHETER AORTIC VALVE REPLACEMENT, TRANSFEMORAL N/A 03/04/2017   Procedure: TRANSCATHETER AORTIC VALVE REPLACEMENT, TRANSFEMORAL;  Surgeon: [name], MD;  Location: [facility] OR;  Service: Open Heart Surgery;  Laterality: N/A;   HPI:  [name] is an 89 yo male admitted on 09/23/24 with acute on chronic combined systolic and diastolic CHF. PmHx of AICD placement, status post TAVR, AF, OSA on CPAP, gout, CKD,  DMT2, macular degeneration, hypertension, coronary artery disease, and hypothyroidism, hx of CVA, ESBL urosepsis. SLP consulted for a clinical swallow assessment.    Assessment / Plan / Recommendation  Clinical Impression   Pt presents with a grossly functional oropharyngeal swallow per clinical swallow assessment completed today. Oral prep and transit prompt with complete oral clearance. Pharyngeal swallow initiation appeared timely with laryngeal elevation noted. Observed immediate throat clear with x1 out of 5+ sip of thin liquids. Pt verbalized awareness that he is supposed to avoid straws; however, he reported they make drinking easier while in the hospital. We discussed trying single straw sips as well as other aspiration precautions. Good understanding verbalized.   Recommend continue current diet at tolerated and PO meds as tolerated. No acute SLP needs identified at this time. SLP will sign off. Please re-consult if pt exhibits concerns for aspiration with PO intake.   SLP Visit Diagnosis: Dysphagia, unspecified (R13.10)     Aspiration Risk  Mild aspiration risk    Diet Recommendation Regular;Thin liquid    Liquid Administration via: Cup;Straw (single sips) Medication Administration: Whole meds with liquid Supervision: Patient able to self feed Compensations: Slow rate;Small sips/bites Postural Changes: Remain upright for at least 30 minutes after po intake    Other Recommendations Oral Care Recommendations: Oral care BID         Functional Status Assessment Patient has not had a recent decline in their functional status         Prognosis Prognosis for improved oropharyngeal function: Good      Swallow Study   General Date of Onset: 09/24/24 HPI: [name] is an 89 yo male admitted on 09/23/24 with acute on chronic combined systolic and diastolic CHF. PmHx of AICD placement, status post TAVR, AF, OSA on CPAP, gout, CKD,  DMT2, macular degeneration, hypertension, coronary artery disease, and hypothyroidism, hx of CVA, ESBL urosepsis. SLP consulted for a clinical swallow assessment. Type of Study: Bedside Swallow Evaluation Previous Swallow Assessment: MBS 11/24/23  with recommendations of a regular diet and thin liquids; GERD precautions Diet Prior to this Study: Regular;Thin liquids (Level 0) Temperature Spikes Noted: No Respiratory Status: Room air History of Recent Intubation: No Behavior/Cognition: Alert;Cooperative Oral Cavity Assessment: Within Functional Limits Oral Care Completed by SLP: Yes Vision: Functional for self-feeding Self-Feeding Abilities: Able to feed self Patient Positioning: Upright in bed Baseline Vocal Quality: Normal Volitional Swallow: Able to elicit    Oral/Motor/Sensory Function Overall Oral Motor/Sensory Function: Within functional limits   Ice Chips Ice chips: Not tested   Thin Liquid Thin Liquid: Impaired Presentation: Self Fed Pharyngeal  Phase Impairments: Throat Clearing - Immediate    Nectar Thick Nectar Thick Liquid: Not tested   Honey Thick Honey Thick  Liquid: Not tested   Puree Puree: Not tested   Solid     Solid: Within functional limits Presentation: Self Fed      Peyton JINNY Rummer 09/24/2024,3:02 PM

## 2024-09-24 NOTE — Evaluation (Signed)
 Occupational Therapy Evaluation Patient Details Name: Justin [name] MRN: [id number] DOB: [birth year] Today's Date: 09/24/2024   History of Present Illness   Pt is an 89 y.o. M presenting to [name] on 09/23/24 following a fall on blood thinners. CT of abdomen shows small right and left pleural effusions. Pt admitted with acute on chronic systolic and diastolic CHF.     Clinical Impressions Prior to this admission, patient living at [facility], and receiving assist for lower body ADLs and with bathing. Patient will typcially walk 230ft to the dining hall, but uses his power chair for longer distances. Currently, patient presenting with decreased activity tolerance and weakness. Patient also endorsing 4 falls in the last two weeks. Patient endorses hitting his head, however no deficits reported or noted. Patient min A to sit on the EOB, however extremely effortful. Patient about to complete ambulation, however had already completed mulitple bouts of ambulation and deferred due to fatigue. Patient set up with lunch on EOB and NT informed. OT recommending HHOT at discharge, OT will continue to follow acutely.      If plan is discharge home, recommend the following:   A lot of help with bathing/dressing/bathroom;Assistance with cooking/housework;Direct supervision/assist for medications management;Direct supervision/assist for financial management;Assist for transportation;Help with stairs or ramp for entrance;A lot of help with walking and/or transfers     Functional Status Assessment   Patient has had a recent decline in their functional status and demonstrates the ability to make significant improvements in function in a reasonable and predictable amount of time.     Equipment Recommendations   None recommended by OT     Recommendations for Other Services         Precautions/Restrictions   Precautions Precautions: Fall Recall of Precautions/Restrictions:  Intact Restrictions Weight Bearing Restrictions Per Provider Order: No     Mobility Bed Mobility Overal bed mobility: Needs Assistance Bed Mobility: Rolling, Sidelying to Sit Rolling: Min assist Sidelying to sit: Min assist       General bed mobility comments: min A to sequence and to transition to fully upright position    Transfers Overall transfer level: Needs assistance                 General transfer comment: deferred as patient had completed multiple bouts of mobility      Balance Overall balance assessment: Needs assistance Sitting-balance support: No upper extremity supported, Feet supported Sitting balance-Leahy Scale: Good Sitting balance - Comments: seated EOB, able to perform reciprocal scoot towards EOB for foot flat position with no LOB or signs of instability                                   ADL either performed or assessed with clinical judgement   ADL Overall ADL's : Needs assistance/impaired Eating/Feeding: Set up;Sitting   Grooming: Wash/dry hands;Wash/dry face;Set up;Sitting   Upper Body Bathing: Contact guard assist;Sitting   Lower Body Bathing: Moderate assistance;Sit to/from stand;Sitting/lateral leans   Upper Body Dressing : Contact guard assist;Sitting   Lower Body Dressing: Moderate assistance;Maximal assistance;Sit to/from Market Researcher Details (indicate cue type and reason): deferred due to fatigue         Functional mobility during ADLs: Moderate assistance;Cueing for safety;Cueing for sequencing;Rolling walker (2 wheels) General ADL Comments: Prior to this admission, patient living at [facility], and receiving assist for lower body ADLs and with bathing.  Patient will typcially walk 263ft to the dining hall, but uses his power chair for longer distances. Currently, patient presenting with decreased activity tolerance and weakness. Patient also endorsing 4 falls in the last two weeks. Patient min A  to sit on the EOB, however extremely effortful. Patient about to complete ambulation, however had already completed mulitple bouts of ambulation and deferred due to fatigue. Patient set up with lunch on EOB and NT informed. OT recommending HHOT at discharge, OT will continue to follow acutely.     Vision Baseline Vision/History: 6 Macular Degeneration Ability to See in Adequate Light: 3 Highly impaired Patient Visual Report: No change from baseline Vision Assessment?: No apparent visual deficits Additional Comments: cannot see faces, states he is at his baseline     Perception Perception: Not tested       Praxis Praxis: Not tested       Pertinent Vitals/Pain Pain Assessment Pain Assessment: No/denies pain     Extremity/Trunk Assessment Upper Extremity Assessment Upper Extremity Assessment: Generalized weakness;Right hand dominant   Lower Extremity Assessment Lower Extremity Assessment: Defer to PT evaluation   Cervical / Trunk Assessment Cervical / Trunk Assessment: Kyphotic   Communication Communication Communication: No apparent difficulties   Cognition Arousal: Alert Behavior During Therapy: WFL for tasks assessed/performed Cognition: No apparent impairments                               Following commands: Intact       Cueing  General Comments   Cueing Techniques: Verbal cues;Gestural cues  VSS on RA   Exercises     Shoulder Instructions      Home Living Family/patient expects to be discharged to:: Assisted living                             Home Equipment: Rollator (4 wheels);Shower seat;Grab bars - toilet;Grab bars - tub/shower;Hand held shower head   Additional Comments: Pt is resident of [facility]. Pt reports occasionally using oxygen on 2L at night time. Pt reporting he receives PT at the ALF.      Prior Functioning/Environment Prior Level of Function : Needs assist       Physical Assist : ADLs (physical)    ADLs (physical): Grooming;Bathing;Dressing;Toileting;IADLs Mobility Comments: pt reports utilizing rollator for mobility when ambulating to dining hall and around his home. For longer distances, pt uses a power chair. Pt reports 4 falls within the past couple of weeks, 2 of them being sliding out of bed and 2 of them his legs gave out. ADLs Comments: Pt reports needing assistance with showering, LB dressing and all IADLs.    OT Problem List: Decreased activity tolerance;Impaired balance (sitting and/or standing);Impaired vision/perception   OT Treatment/Interventions: Self-care/ADL training;DME and/or AE instruction;Patient/family education;Balance training;Therapeutic activities      OT Goals(Current goals can be found in the care plan section)   Acute Rehab OT Goals Patient Stated Goal: to get better OT Goal Formulation: With patient Time For Goal Achievement: 10/08/24 Potential to Achieve Goals: Good ADL Goals Pt Will Perform Lower Body Bathing: with min assist;sit to/from stand;sitting/lateral leans Pt Will Perform Lower Body Dressing: with min assist;sitting/lateral leans;sit to/from stand Pt Will Transfer to Toilet: with min assist;ambulating;regular height toilet Pt Will Perform Toileting - Clothing Manipulation and hygiene: with min assist;sitting/lateral leans;sit to/from stand Additional ADL Goal #1: Patient will be able to complete functional  task in standing for 3 minutes prior to needing seated rest break in order to increase overall activity tolerance.   OT Frequency:  Min 2X/week    Co-evaluation              AM-PAC OT 6 Clicks Daily Activity     Outcome Measure Help from another person eating meals?: A Little Help from another person taking care of personal grooming?: A Little Help from another person toileting, which includes using toliet, bedpan, or urinal?: A Lot Help from another person bathing (including washing, rinsing, drying)?: A Lot Help from  another person to put on and taking off regular upper body clothing?: A Little Help from another person to put on and taking off regular lower body clothing?: A Lot 6 Click Score: 15   End of Session Nurse Communication: Mobility status  Activity Tolerance: Patient tolerated treatment well Patient left: in bed;with call bell/phone within reach;with bed alarm set (sitting EOB)  OT Visit Diagnosis: Unsteadiness on feet (R26.81);Other abnormalities of gait and mobility (R26.89);Low vision, both eyes (H54.2);Dizziness and giddiness (R42)                Time: 8674-8654 OT Time Calculation (min): 20 min Charges:  OT General Charges $OT Visit: 1 Visit OT Evaluation $OT Eval Moderate Complexity: 1 Mod  Ronal Gift E. Jago Carton, OTR/L Acute Rehabilitation Services [phone]   Ronal Gift Salt 09/24/2024, 3:38 PM

## 2024-09-25 DIAGNOSIS — I5020 Unspecified systolic (congestive) heart failure: Secondary | ICD-10-CM

## 2024-09-25 LAB — BASIC METABOLIC PANEL WITH GFR
Anion gap: 12 (ref 5–15)
BUN: 19 mg/dL (ref 8–23)
CO2: 28 mmol/L (ref 22–32)
Calcium: 8.1 mg/dL — ABNORMAL LOW (ref 8.9–10.3)
Chloride: 99 mmol/L (ref 98–111)
Creatinine, Ser: 1.27 mg/dL — ABNORMAL HIGH (ref 0.61–1.24)
GFR, Estimated: 54 mL/min — ABNORMAL LOW
Glucose, Bld: 117 mg/dL — ABNORMAL HIGH (ref 70–99)
Potassium: 3.6 mmol/L (ref 3.5–5.1)
Sodium: 138 mmol/L (ref 135–145)

## 2024-09-25 LAB — GLUCOSE, CAPILLARY
Glucose-Capillary: 109 mg/dL — ABNORMAL HIGH (ref 70–99)
Glucose-Capillary: 119 mg/dL — ABNORMAL HIGH (ref 70–99)
Glucose-Capillary: 133 mg/dL — ABNORMAL HIGH (ref 70–99)
Glucose-Capillary: 107 mg/dL — ABNORMAL HIGH (ref 70–99)
Glucose-Capillary: 134 mg/dL — ABNORMAL HIGH (ref 70–99)
Glucose-Capillary: 88 mg/dL (ref 70–99)

## 2024-09-25 LAB — CBC
HCT: 33.3 % — ABNORMAL LOW (ref 39.0–52.0)
Hemoglobin: 10.4 g/dL — ABNORMAL LOW (ref 13.0–17.0)
MCH: 27.2 pg (ref 26.0–34.0)
MCHC: 31.2 g/dL (ref 30.0–36.0)
MCV: 86.9 fL (ref 80.0–100.0)
Platelets: 281 10*3/uL (ref 150–400)
RBC: 3.83 MIL/uL — ABNORMAL LOW (ref 4.22–5.81)
RDW: 18.4 % — ABNORMAL HIGH (ref 11.5–15.5)
WBC: 9.6 10*3/uL (ref 4.0–10.5)
nRBC: 0 % (ref 0.0–0.2)

## 2024-09-25 LAB — LACTIC ACID, PLASMA
Lactic Acid, Venous: 4.6 mmol/L (ref 0.5–1.9)
Lactic Acid, Venous: 3.4 mmol/L (ref 0.5–1.9)

## 2024-09-25 NOTE — Plan of Care (Signed)
" °  Problem: Health Behavior/Discharge Planning: Goal: Ability to manage health-related needs will improve Outcome: Progressing   Problem: Metabolic: Goal: Ability to maintain appropriate glucose levels will improve Outcome: Progressing   Problem: Health Behavior/Discharge Planning: Goal: Ability to manage health-related needs will improve Outcome: Progressing   Problem: Clinical Measurements: Goal: Respiratory complications will improve Outcome: Progressing Goal: Cardiovascular complication will be avoided Outcome: Progressing   Problem: Respiratory: Goal: Ability to maintain adequate ventilation will improve Outcome: Progressing   "

## 2024-09-25 NOTE — Progress Notes (Signed)
 " PROGRESS NOTE    Justin [name]  FMW:[id number] DOB: 1934-08-16 DOA: 09/23/2024 PCP: Mast, Man X, NP  89/M with chronic systolic CHF with RV failure, permanent atrial fibrillation, TAVR, [employer] ICD, CAD/CABG, CKD 3, recently admitted with ESBL E. coli urosepsis, AKI CKD 3, GDMT was discontinued, seen in heart failure clinic 1/26 noted to have fluid retention, restarted on torsemide . - Back in the ED 1/29 with recurrent falls, volume overload by OptiVol, labs noted creatinine of 1.3, proBNP 3300, troponin 43, lactate 2.4, CT head with chronic disease.  Patient reports 2 of his falls were where he slid down from a chair and last episode yesterday was a true fall, history of orthostatic symptoms as well   Subjective: - Breathing a little better, swelling improving  Assessment and Plan:  Acute on chronic systolic CHF - Last echo 3/25 with EF 40-45%, moderately reduced RV - Recently management complicated by AKI, orthostasis etc. - Followed by CHF team,  concerns that he may be nearing end-stage, he is DNR, palliative consulted -Cardiology following, urine output inaccurate, continue IV Lasix , Aldactone  , holding beta-blocker, lactate was elevated, but poor candidate for Inotropes etc - Clinically do not suspect pneumonia based on symptoms, no fever or leukocytosis, procalcitonin<0.1, DC ABX and monitor - Increase activity, BMP in a.m.  Frequent falls - Does not have any localizing signs, CT head with chronic disease - Has some orthostatic symptoms as well, check orthostatic vitals and B12 - PT OT eval> may need SNF - Suspect his falls are multifactorial,   Permanent A-fib -With RVR - Beta-blocker on hold now, continue amiodarone , Pradaxa  - Resume beta-blocker when volume status is better  CAD/CABG - Continue atorvastatin , Pradaxa   TAVR  Hypothyroidism  COPD - Stable continue home nebs  Type 2 diabetes mellitus - Recent A1c was 6.2, SSI for now  CKD 3A - Stable,  monitor, recent admission with AKI  DVT prophylaxis: Pradaxa  Code Status: DNR Family Communication: No family at bedside, will call and update daughter Disposition Plan: Need SNF  Consultants:    Procedures:   Antimicrobials:    Objective: Vitals:   09/24/24 2348 09/25/24 0340 09/25/24 0829 09/25/24 1123  BP: 129/75 126/75 124/61 117/74  Pulse: 87 (!) 111 65 (!) 107  Resp: 19 17  18   Temp: 98.1 F (36.7 C) 97.7 F (36.5 C)  97.8 F (36.6 C)  TempSrc: Oral Oral  Oral  SpO2:  96% 93% 96%  Weight:  124 kg      Intake/Output Summary (Last 24 hours) at 09/25/2024 1151 Last data filed at 09/25/2024 1115 Gross per 24 hour  Intake 1794 ml  Output 976 ml  Net 818 ml   Filed Weights   09/25/24 0340  Weight: 124 kg    Examination:  General exam: Appears calm and comfortable, AAO x 3 Respiratory system: Decreased breath sounds at the bases Cardiovascular system: S1 & S2 heard, irregular Abd: nondistended, soft and nontender.Normal bowel sounds heard. Central nervous system: Alert and oriented. No focal neurological deficits. Extremities:  1-2+ edema Skin: No rashes Psychiatry:  Mood & affect appropriate.     Data Reviewed:   CBC: Recent Labs  Lab 09/23/24 1418 09/23/24 1430 09/24/24 0405 09/25/24 0238  WBC 10.3  --  10.2 9.6  NEUTROABS 6.7  --   --   --   HGB 11.0* 11.9*  11.9* 10.9* 10.4*  HCT 35.1* 35.0*  35.0* 35.3* 33.3*  MCV 86.0  --  87.8 86.9  PLT 312  --  297 281   Basic Metabolic Panel: Recent Labs  Lab 09/23/24 1418 09/23/24 1430 09/24/24 0405 09/25/24 0238  NA 141 141  141 141 138  K 3.4* 3.3*  3.3* 3.3* 3.6  CL 99 97* 100 99  CO2 31  --  29 28  GLUCOSE 102* 101* 106* 117*  BUN 20 21 18 19   CREATININE 1.33* 1.40* 1.26* 1.27*  CALCIUM  8.4*  --  8.3* 8.1*  MG 1.9  --  1.8  --   PHOS  --   --  3.4  --    GFR: Estimated Creatinine Clearance: 50 mL/min (A) (by C-G formula based on SCr of 1.27 mg/dL (H)). Liver Function  Tests: Recent Labs  Lab 09/23/24 1418 09/24/24 0405  AST 40 30  ALT 22 21  ALKPHOS 180* 132*  BILITOT 0.7 0.8  PROT 6.6 6.2*  ALBUMIN  2.9* 2.5*   No results for input(s): LIPASE, AMYLASE in the last 168 hours. No results for input(s): AMMONIA in the last 168 hours. Coagulation Profile: No results for input(s): INR, PROTIME in the last 168 hours. Cardiac Enzymes: No results for input(s): CKTOTAL, CKMB, CKMBINDEX, TROPONINI in the last 168 hours. BNP (last 3 results) Recent Labs    08/30/24 1150 09/07/24 1655 09/23/24 1418  PROBNP 4,372* 2,178.0* 3,327.0*   HbA1C: No results for input(s): HGBA1C in the last 72 hours. CBG: Recent Labs  Lab 09/24/24 2121 09/24/24 2348 09/25/24 0604 09/25/24 0737 09/25/24 1119  GLUCAP 114* 115* 109* 119* 133*   Lipid Profile: No results for input(s): CHOL, HDL, LDLCALC, TRIG, CHOLHDL, LDLDIRECT in the last 72 hours. Thyroid  Function Tests: Recent Labs    09/24/24 0405  TSH 1.680   Anemia Panel: Recent Labs    09/24/24 0856  VITAMINB12 842   Urine analysis:    Component Value Date/Time   COLORURINE STRAW (A) 09/23/2024 1359   APPEARANCEUR CLEAR 09/23/2024 1359   LABSPEC 1.005 09/23/2024 1359   PHURINE 5.0 09/23/2024 1359   GLUCOSEU NEGATIVE 09/23/2024 1359   GLUCOSEU NEGATIVE 05/07/2023 1415   HGBUR NEGATIVE 09/23/2024 1359   HGBUR negative 09/05/2010 0810   BILIRUBINUR NEGATIVE 09/23/2024 1359   BILIRUBINUR NEG 03/20/2022 1121   KETONESUR NEGATIVE 09/23/2024 1359   PROTEINUR NEGATIVE 09/23/2024 1359   UROBILINOGEN 0.2 05/07/2023 1415   NITRITE NEGATIVE 09/23/2024 1359   LEUKOCYTESUR NEGATIVE 09/23/2024 1359   Sepsis Labs: @LABRCNTIP (procalcitonin:4,lacticidven:4)  ) Recent Results (from the past 240 hours)  Culture, blood (routine x 2)     Status: None (Preliminary result)   Collection Time: 09/23/24  2:10 PM   Specimen: BLOOD LEFT ARM  Result Value Ref Range Status   Specimen  Description BLOOD LEFT ARM  Final   Special Requests   Final    BOTTLES DRAWN AEROBIC AND ANAEROBIC Blood Culture adequate volume   Culture   Final    NO GROWTH 2 DAYS Performed at [facility], [address], [place], [place] [address]    Report Status PENDING  Incomplete  Resp panel by RT-PCR (RSV, Flu A&B, Covid) Anterior Nasal Swab     Status: None   Collection Time: 09/23/24 11:26 PM   Specimen: Anterior Nasal Swab  Result Value Ref Range Status   SARS Coronavirus 2 by RT PCR NEGATIVE NEGATIVE Final   Influenza A by PCR NEGATIVE NEGATIVE Final   Influenza B by PCR NEGATIVE NEGATIVE Final    Comment: (NOTE) The Xpert Xpress SARS-CoV-2/FLU/RSV plus assay is intended as an aid in the  diagnosis of influenza from Nasopharyngeal swab specimens and should not be used as a sole basis for treatment. Nasal washings and aspirates are unacceptable for Xpert Xpress SARS-CoV-2/FLU/RSV testing.  Fact Sheet for Patients: bloggercourse.com  Fact Sheet for Healthcare Providers: seriousbroker.it  This test is not yet approved or cleared by the [place]  FDA and has been authorized for detection and/or diagnosis of SARS-CoV-2 by FDA under an Emergency Use Authorization (EUA). This EUA will remain in effect (meaning this test can be used) for the duration of the COVID-19 declaration under Section 564(b)(1) of the Act, 21 U.S.C. section 360bbb-3(b)(1), unless the authorization is terminated or revoked.     Resp Syncytial Virus by PCR NEGATIVE NEGATIVE Final    Comment: (NOTE) Fact Sheet for Patients: bloggercourse.com  Fact Sheet for Healthcare Providers: seriousbroker.it  This test is not yet approved or cleared by the [place]  FDA and has been authorized for detection and/or diagnosis of SARS-CoV-2 by FDA under an Emergency Use Authorization (EUA). This EUA will remain in  effect (meaning this test can be used) for the duration of the COVID-19 declaration under Section 564(b)(1) of the Act, 21 U.S.C. section 360bbb-3(b)(1), unless the authorization is terminated or revoked.  Performed at [facility], [address], [place], [place] [address]   Culture, blood (routine x 2)     Status: None (Preliminary result)   Collection Time: 09/23/24 11:29 PM   Specimen: BLOOD LEFT HAND  Result Value Ref Range Status   Specimen Description BLOOD LEFT HAND  Final   Special Requests   Final    BOTTLES DRAWN AEROBIC ONLY Blood Culture adequate volume   Culture   Final    NO GROWTH 2 DAYS Performed at [facility], [address] [place], [place] [address]    Report Status PENDING  Incomplete     Radiology Studies: ECHOCARDIOGRAM COMPLETE Result Date: 09/24/2024    ECHOCARDIOGRAM REPORT   Patient Name:   Justin [name] Date of Exam: 09/24/2024 Medical Rec #:  [id number]        Height:       66.0 in Accession #:    [id number]       Weight:       257.0 lb Date of Birth:  [birth year]       BSA:          2.225 m Patient Age:    89 years         BP:           122/61 mmHg Patient Gender: M                HR:           92 bpm. Exam Location:  Inpatient Procedure: 2D Echo, Cardiac Doppler, Color Doppler and Intracardiac            Opacification Agent (Both Spectral and Color Flow Doppler were            utilized during procedure). Indications:    CHF-Acute Systolic I50.21  History:        Patient has prior history of Echocardiogram examinations, most                 recent 11/18/2023. CHF, CAD; Risk Factors:Hypertension.                 Aortic Valve: 29 mm Sapien prosthetic, stented (TAVR) valve is  present in the aortic position. Procedure Date: 03/04/2017.  Sonographer:    Sydnee Wilson RDCS Referring Phys: [id number] ANASTASSIA DOUTOVA  Sonographer Comments: Technically difficult study due to poor echo windows. IMPRESSIONS  1. This study was somewhat limited by  poor sound transmission. The RV systolic function appears to be severely reduced, but it was overall poorly visualized.  2. Left ventricular ejection fraction, by estimation, is 35 to 40%. Left ventricular ejection fraction by 2D MOD biplane is 42.3 %. The left ventricle has moderately decreased function. The left ventricle demonstrates regional wall motion abnormalities (see scoring diagram/findings for description). Left ventricular diastolic parameters are consistent with Grade III diastolic dysfunction (restrictive). Abnormal septal bounce due to pacing.  3. The IVC was not well visualized for RVSP assessment. Right ventricular systolic function is severely reduced. The right ventricular size is not well visualized.  4. Left atrial size was severely dilated.  5. The mitral valve is degenerative. Trivial mitral valve regurgitation. No evidence of mitral stenosis. Moderate mitral annular calcification.  6. Tricuspid valve regurgitation is mild to moderate.  7. The aortic valve has been repaired/replaced. Aortic valve regurgitation is not visualized. No aortic stenosis is present. There is a 29 mm Sapien prosthetic (TAVR) valve present in the aortic position. Procedure Date: 03/04/2017. Echo findings are consistent with normal structure and function of the aortic valve prosthesis. Aortic valve mean gradient measures 5.5 mmHg. Aortic valve Vmax measures 1.49 m/s. Comparison(s): Changes from prior study are noted. Severe reduction in RV systolic function. Conclusion(s)/Recommendation(s): Consider cardiac MRI if better visualization of the RV is desired. FINDINGS  Left Ventricle: Left ventricular ejection fraction, by estimation, is 35 to 40%. Left ventricular ejection fraction by 2D MOD biplane is 42.3 %. The left ventricle has moderately decreased function. The left ventricle demonstrates regional wall motion abnormalities. Definity  contrast agent was given IV to delineate the left ventricular endocardial borders.  The left ventricular internal cavity size was normal in size. There is no left ventricular hypertrophy. Abnormal (paradoxical) septal motion, consistent with RV pacemaker. Left ventricular diastolic parameters are consistent with Grade III diastolic dysfunction (restrictive).  LV Wall Scoring: Abnormal septal bounce due to pacing. Right Ventricle: The IVC was not well visualized for RVSP assessment. The right ventricular size is not well visualized. Right vetricular wall thickness was not well visualized. Right ventricular systolic function is severely reduced. Left Atrium: Left atrial size was severely dilated. Right Atrium: Right atrial size was normal in size. Pericardium: There is no evidence of pericardial effusion. Mitral Valve: The mitral valve is degenerative in appearance. There is mild thickening of the mitral valve leaflet(s). Moderate mitral annular calcification. Trivial mitral valve regurgitation. No evidence of mitral valve stenosis. Tricuspid Valve: The tricuspid valve is normal in structure. Tricuspid valve regurgitation is mild to moderate. No evidence of tricuspid stenosis. Aortic Valve: The aortic valve has been repaired/replaced. Aortic valve regurgitation is not visualized. No aortic stenosis is present. Aortic valve mean gradient measures 5.5 mmHg. Aortic valve peak gradient measures 8.9 mmHg. Aortic valve area, by VTI measures 3.08 cm. There is a 29 mm Sapien prosthetic, stented (TAVR) valve present in the aortic position. Procedure Date: 03/04/2017. Echo findings are consistent with normal structure and function of the aortic valve prosthesis. Pulmonic Valve: The pulmonic valve was normal in structure. Pulmonic valve regurgitation is mild. No evidence of pulmonic stenosis. Aorta: The aortic root and ascending aorta are structurally normal, with no evidence of dilitation. Venous: The inferior vena cava was not well visualized.  IAS/Shunts: No atrial level shunt detected by color flow Doppler.  Additional Comments: A device lead is visualized.  LEFT VENTRICLE PLAX 2D                        Biplane EF (MOD) LVIDd:         4.30 cm         LV Biplane EF:   Left LVIDs:         3.40 cm                          ventricular LV PW:         1.10 cm                          ejection LV IVS:        0.90 cm                          fraction by LVOT diam:     2.00 cm                          2D MOD LV SV:         81                               biplane is LV SV Index:   36                               42.3 %. LVOT Area:     3.14 cm                                Diastology                                LV e' medial:    6.09 cm/s LV Volumes (MOD)               LV E/e' medial:  17.6 LV vol d, MOD    158.0 ml      LV e' lateral:   6.20 cm/s A2C:                           LV E/e' lateral: 17.3 LV vol d, MOD    129.0 ml A4C: LV vol s, MOD    82.0 ml A2C: LV vol s, MOD    77.2 ml A4C: LV SV MOD A2C:   76.0 ml LV SV MOD A4C:   129.0 ml LV SV MOD BP:    61.5 ml RIGHT VENTRICLE RV S prime:     5.00 cm/s TAPSE (M-mode): 0.8 cm LEFT ATRIUM              Index        RIGHT ATRIUM           Index LA diam:        5.50 cm  2.47 cm/m   RA Area:     18.10 cm LA Vol (A2C):   104.0 ml 46.75 ml/m  RA Volume:  40.10 ml  18.02 ml/m LA Vol (A4C):   112.0 ml 50.34 ml/m LA Biplane Vol: 112.0 ml 50.34 ml/m  AORTIC VALVE AV Area (Vmax):    2.64 cm AV Area (Vmean):   2.53 cm AV Area (VTI):     3.08 cm AV Vmax:           148.75 cm/s AV Vmean:          108.625 cm/s AV VTI:            0.264 m AV Peak Grad:      8.9 mmHg AV Mean Grad:      5.5 mmHg LVOT Vmax:         125.00 cm/s LVOT Vmean:        87.400 cm/s LVOT VTI:          0.258 m LVOT/AV VTI ratio: 0.98  AORTA Ao Root diam: 3.10 cm Ao Asc diam:  3.20 cm MITRAL VALVE                TRICUSPID VALVE MV Area (PHT): 3.51 cm     TR Peak grad:   41.2 mmHg MV Decel Time: 216 msec     TR Vmax:        321.00 cm/s MV E velocity: 107.00 cm/s MV A velocity: 33.00 cm/s   SHUNTS MV E/A ratio:   3.24         Systemic VTI:  0.26 m                             Systemic Diam: 2.00 cm Georganna Archer Electronically signed by Georganna Archer Signature Date/Time: 09/24/2024/2:11:45 PM    Final    CT CHEST ABDOMEN PELVIS W CONTRAST Result Date: 09/23/2024 EXAM: CT CHEST, ABDOMEN AND PELVIS WITH CONTRAST 09/23/2024 03:33:22 PM TECHNIQUE: CT of the chest, abdomen and pelvis was performed with the administration of 75 mL iohexol  (OMNIPAQUE ) 350 MG/ML injection. Multiplanar reformatted images are provided for review. Automated exposure control, iterative reconstruction, and/or weight based adjustment of the mA/kV was utilized to reduce the radiation dose to as low as reasonably achievable. COMPARISON: 09/23/2024, 09/10/2024, 01/27/2024 CLINICAL HISTORY: Polytrauma, blunt. FINDINGS: CHEST: MEDIASTINUM AND LYMPH NODES: Moderate cardiomegaly. Endovascular aortic valve replacement. Left chest pacemaker/automatic implantable cardioverter-defibrillator (AICD) with leads terminating in the right atrium, right ventricle, and coronary sinus. Coronary artery bypass graft (CABG) changes. Diffuse aortic atherosclerosis. Moderate narrowing of the proximal right subclavian artery due to a chunky calcified plaque (axial 6). The central airways are clear. No mediastinal, hilar or axillary lymphadenopathy. LUNGS AND PLEURA: Small right and trace left pleural effusions, decreased in size in the interim. Similar appearance of the scattered reticular opacities throughout the lungs. A couple of nodular opacities are noted in the medial left lower lobe. No focal consolidation or pulmonary edema. No pneumothorax. ABDOMEN AND PELVIS: LIVER: Unremarkable. GALLBLADDER AND BILE DUCTS: Unremarkable. No biliary ductal dilatation. SPLEEN: No acute abnormality. PANCREAS: Mild pancreatic parenchymal fatty atrophy. ADRENAL GLANDS: No acute abnormality. KIDNEYS, URETERS AND BLADDER: Mild renal cortical atrophy bilaterally. No stones in the kidneys or  ureters. No hydronephrosis. No perinephric or periureteral stranding. Urinary bladder is unremarkable. GI AND BOWEL: Stomach demonstrates no acute abnormality. Normal appendix. Descending and sigmoid colon diverticulosis. No changes of acute diverticulitis. There is no bowel obstruction. REPRODUCTIVE ORGANS: No acute abnormality. PERITONEUM AND RETROPERITONEUM: No ascites. No free air. VASCULATURE: Aorta is normal in caliber. Diffuse aortoiliac atherosclerosis. Severe stenoses  at both the celiac and superior mesenteric artery (SMA) ostia from calcified plaque. ABDOMINAL AND PELVIS LYMPH NODES: No lymphadenopathy. BONES AND SOFT TISSUES: Diffuse osteopenia. Multilevel degenerative disc disease of the spine. Sternotomy wires. Subtle cortical irregularity along the right anterior 5th rib, which could represent a remote fracture. Correlation with point tenderness recommended to exclude an acute buckle type fracture. No focal soft tissue abnormality. IMPRESSION: 1. Subtle cortical irregularity along the right anterior 5th rib, which could represent a remote fracture. Correlation with point tenderness recommended to exclude an acute, buckled type rib fracture. 2. Small right and trace left pleural effusions, decreased in size in the interim. A couple of nodular opacities noted in the left lower lobe, which may represent changes of a developing bronchial pneumonia or aspiration. 3. Descending and sigmoid colonic diverticulosis. No changes of acute diverticulitis. Electronically signed by: Rogelia Myers MD 09/23/2024 04:33 PM EST RP Workstation: [id number]   CT Head Wo Contrast Result Date: 09/23/2024 EXAM: CT HEAD WITHOUT CONTRAST 09/23/2024 03:33:22 PM TECHNIQUE: CT of the head was performed without the administration of intravenous contrast. Automated exposure control, iterative reconstruction, and/or weight based adjustment of the mA/kV was utilized to reduce the radiation dose to as low as reasonably achievable.  COMPARISON: Head CT 10/27/2020. CLINICAL HISTORY: Head trauma, minor (Age 64 years or older). Mechanical fall on blood thinners. FINDINGS: BRAIN AND VENTRICLES: There is no evidence of an acute infarct, intracranial hemorrhage, mass, midline shift, hydrocephalus, or extra-axial fluid collection. There is mild cerebral atrophy. Patchy to confluent hypodensities in the cerebral white matter bilaterally have progressed and are nonspecific but compatible with moderate chronic small vessel ischemic disease. There are small chronic bilateral cerebellar infarcts. Calcified atherosclerosis at the skull base. ORBITS: Bilateral cataract extraction. SINUSES: No acute abnormality. SOFT TISSUES AND SKULL: No acute soft tissue abnormality. No skull fracture. IMPRESSION: 1. No acute intracranial abnormality. 2. Moderate chronic small vessel ischemic disease. 3. Small chronic bilateral cerebellar infarcts. 4. Cerebral Atrophy (ICD10-G31.9). Electronically signed by: Dasie Hamburg MD 09/23/2024 04:06 PM EST RP Workstation: [id number]   CT Cervical Spine Wo Contrast Result Date: 09/23/2024 EXAM: CT CERVICAL SPINE WITH CONTRAST 09/23/2024 03:33:22 PM TECHNIQUE: CT of the cervical spine was performed with the administration of intravenous contrast (75 mL iohexol  (OMNIPAQUE ) 350 MG/ML injection). Multiplanar reformatted images are provided for review. Automated exposure control, iterative reconstruction, and/or weight based adjustment of the mA/kV was utilized to reduce the radiation dose to as low as reasonably achievable. COMPARISON: None available. CLINICAL HISTORY: Neck trauma. Mechanical fall. On blood thinners. FINDINGS: BONES AND ALIGNMENT: Straightening of the normal cervical lordosis. No significant listhesis. No acute fracture or suspicious lesion. DEGENERATIVE CHANGES: Moderate diffuse cervical disc degeneration and advanced facet arthrosis. Bilateral facet and partial interbody ankylosis at C2-C3. Moderate to severe left  neural foraminal stenosis at C3-C4. No evidence of high grade spinal canal stenosis. SOFT TISSUES: No prevertebral soft tissue swelling. IMPRESSION: 1. No acute cervical spine fracture or traumatic malalignment. Electronically signed by: Dasie Hamburg MD 09/23/2024 04:03 PM EST RP Workstation: [id number]   DG Knee Complete 4 Views Right Result Date: 09/23/2024 EXAM: 4 VIEW(S) XRAY OF THE RIGHT KNEE 09/23/2024 01:53:00 PM COMPARISON: None available. CLINICAL HISTORY: Right knee pain and swelling. 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 Resurfaced patella with increased sclerosis and stable inferior pole patchy lucency with increased spur formation and possible increased heterotopic ossification along the proximal patellar tendon. Small joint effusion. No acute fracture. No malalignment. SOFT TISSUES: Vascular  calcifications. Diffuse soft tissue swelling. IMPRESSION: 1. Small joint effusion and diffuse soft tissue swelling. 2. 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 3. Resurfaced patella with increased sclerosis, stable inferior pole patchy lucency, increased spur formation, and possible increased heterotopic ossification along the proximal patellar tendon. 4. Vascular calcifications. Electronically signed by: Ryan Salvage MD 09/23/2024 02:58 PM EST RP Workstation: [id number]   DG Chest Portable 1 View Result Date: 09/23/2024 CLINICAL DATA:  Shortness of breath. EXAM: PORTABLE CHEST 1 VIEW COMPARISON:  Chest radiograph dated 09/07/2024. FINDINGS: Cardiomegaly with vascular congestion. No focal consolidation, pleural effusion or pneumothorax. Left pectoral AICD device. Median sternotomy wires. No acute osseous pathology. IMPRESSION: Cardiomegaly with vascular congestion. Electronically Signed   By: Vanetta Chou M.D.   On: 09/23/2024 14:47     Scheduled Meds:  allopurinol   100 mg Oral Daily   amiodarone   200 mg Oral Daily   atorvastatin   20 mg Oral Daily   dabigatran   150  mg Oral BID   docusate sodium   100 mg Oral BID   ferrous sulfate   325 mg Oral Q breakfast   furosemide   80 mg Intravenous BID   insulin  aspart  0-9 Units Subcutaneous Q4H   levothyroxine   175 mcg Oral QAC breakfast   montelukast   10 mg Oral QHS   pantoprazole   40 mg Oral BID   sodium chloride  flush  3 mL Intravenous Q12H   spironolactone   25 mg Oral Daily   tamsulosin   0.4 mg Oral QHS   venlafaxine  XR  37.5 mg Oral Daily   Continuous Infusions:     LOS: 2 days    Time spent:    Sigurd Pac, MD [facility]   09/25/2024, 11:51 AM    "

## 2024-09-25 NOTE — Progress Notes (Signed)
 "   Progress Note  Patient Name: Justin [name] Date of Encounter: 09/25/2024  Primary Cardiologist: [name], MD  Interval Summary  Chart reviewed.  Urine output incomplete.  No specific change in symptoms.  Tells me that he mainly came in because of recent falls.  He is in atrial fibrillation by telemetry, heart rate ranging from 90s to 120s.  GDMT limited as discussed in the HF consultation.  Vital Signs  Vitals:   09/24/24 2215 09/24/24 2348 09/25/24 0340 09/25/24 0829  BP: 116/66 129/75 126/75 124/61  Pulse: 99 87 (!) 111 65  Resp:  19 17   Temp: 98.2 F (36.8 C) 98.1 F (36.7 C) 97.7 F (36.5 C)   TempSrc: Oral Oral Oral   SpO2:   96% 93%  Weight:   124 kg     Intake/Output Summary (Last 24 hours) at 09/25/2024 0929 Last data filed at 09/25/2024 0602 Gross per 24 hour  Intake 1794 ml  Output 775 ml  Net 1019 ml   Filed Weights   09/25/24 0340  Weight: 124 kg    Physical Exam  GEN: Elderly male, no acute distress.   Neck: No JVD. Cardiac: Indistinct PMI, irregularly irregular without gallop.  Respiratory: Nonlabored. Clear to auscultation bilaterally. GI: Soft, nontender, bowel sounds present. MS: Peripheral edema.  ECG/Telemetry  Telemetry reviewed showing atrial fibrillation with intermittent ventricular pacing, also NSVT.  Labs  Chemistry Recent Labs  Lab 09/23/24 1418 09/23/24 1430 09/24/24 0405 09/25/24 0238  NA 141 141  141 141 138  K 3.4* 3.3*  3.3* 3.3* 3.6  CL 99 97* 100 99  CO2 31  --  29 28  GLUCOSE 102* 101* 106* 117*  BUN 20 21 18 19   CREATININE 1.33* 1.40* 1.26* 1.27*  CALCIUM  8.4*  --  8.3* 8.1*  PROT 6.6  --  6.2*  --   ALBUMIN  2.9*  --  2.5*  --   AST 40  --  30  --   ALT 22  --  21  --   ALKPHOS 180*  --  132*  --   BILITOT 0.7  --  0.8  --   GFRNONAA 51*  --  55* 54*  ANIONGAP 11  --  13 12    Hematology Recent Labs  Lab 09/23/24 1418 09/23/24 1430 09/24/24 0405 09/25/24 0238  WBC 10.3  --  10.2  9.6  RBC 4.08*  --  4.02* 3.83*  HGB 11.0* 11.9*  11.9* 10.9* 10.4*  HCT 35.1* 35.0*  35.0* 35.3* 33.3*  MCV 86.0  --  87.8 86.9  MCH 27.0  --  27.1 27.2  MCHC 31.3  --  30.9 31.2  RDW 18.2*  --  18.5* 18.4*  PLT 312  --  297 281   Cardiac Enzymes Recent Labs  Lab 09/08/24 0656 09/10/24 1332 09/10/24 1439 09/23/24 1418 09/23/24 1646  TRNPT 47* 47* 46* 43* 43*    Lipid Panel     Component Value Date/Time   CHOL 108 10/14/2023 0729   CHOL 171 08/11/2018 0735   TRIG 113 10/14/2023 0729   TRIG 143 06/30/2006 1132   HDL 33 (L) 10/14/2023 0729   HDL 34 (L) 08/11/2018 0735   CHOLHDL 3.3 10/14/2023 0729   VLDL 19 07/03/2023 0426   LDLCALC 55 10/14/2023 0729   LABVLDL 64 (H) 08/11/2018 0735    Cardiac Studies  Echocardiogram 09/24/2024:  1. This study was somewhat limited by poor sound transmission. The RV  systolic  function appears to be severely reduced, but it was overall  poorly visualized.   2. Left ventricular ejection fraction, by estimation, is 35 to 40%. Left  ventricular ejection fraction by 2D MOD biplane is 42.3 %. The left  ventricle has moderately decreased function. The left ventricle  demonstrates regional wall motion abnormalities  (see scoring diagram/findings for description). Left ventricular diastolic  parameters are consistent with Grade III diastolic dysfunction  (restrictive). Abnormal septal bounce due to pacing.   3. The IVC was not well visualized for RVSP assessment. Right ventricular  systolic function is severely reduced. The right ventricular size is not  well visualized.   4. Left atrial size was severely dilated.   5. The mitral valve is degenerative. Trivial mitral valve regurgitation.  No evidence of mitral stenosis. Moderate mitral annular calcification.   6. Tricuspid valve regurgitation is mild to moderate.   7. The aortic valve has been repaired/replaced. Aortic valve  regurgitation is not visualized. No aortic stenosis is  present. There is a  29 mm Sapien prosthetic (TAVR) valve present in the aortic position.  Procedure Date: 03/04/2017. Echo findings are  consistent with normal structure and function of the aortic valve  prosthesis. Aortic valve mean gradient measures 5.5 mmHg. Aortic valve  Vmax measures 1.49 m/s.   Assessment & Plan  1.  Acute on chronic HFrEF, LVEF 35 to 40% with restrictive diastolic filling pattern and significant RV dysfunction by follow-up echocardiogram.  GDMT currently limited to Aldactone  25 mg daily and Lasix  80 mg IV twice daily.  Beta-blocker held with elevated lactate.  2.  History of aortic stenosis status post TAVR, 29 mm SAPIEN prosthesis stable with mean AV gradient 5.5 mmHg and no PVL.  3.  Multivessel CAD status post CABG in 2007, no active angina.  Continues on Lipitor 20 mg daily.  4.  Permanent atrial fibrillation and frequent PVCs.  CHA2DS2-VASc score is 5.  Currently on Pradaxa  for stroke prophylaxis.  Also amiodarone  for rate control.  Continue Lasix  80 mg IV twice daily and Aldactone  25 mg daily.  As fluid status improves would likely add back beta-blocker to assist with rate control of atrial fibrillation.  Other GDMT limited as already indicated in HF consultation note.  Not candidate for advanced HF strategies.  For questions or updates, please contact [facility] Please consult [email or web address] for contact info under   Signed, Jayson Sierras, MD  09/25/2024, 9:29 AM    "

## 2024-09-26 DIAGNOSIS — I5020 Unspecified systolic (congestive) heart failure: Secondary | ICD-10-CM | POA: Diagnosis not present

## 2024-09-26 LAB — BASIC METABOLIC PANEL WITH GFR
Anion gap: 13 (ref 5–15)
BUN: 19 mg/dL (ref 8–23)
CO2: 27 mmol/L (ref 22–32)
Calcium: 8 mg/dL — ABNORMAL LOW (ref 8.9–10.3)
Chloride: 97 mmol/L — ABNORMAL LOW (ref 98–111)
Creatinine, Ser: 1.36 mg/dL — ABNORMAL HIGH (ref 0.61–1.24)
GFR, Estimated: 50 mL/min — ABNORMAL LOW
Glucose, Bld: 107 mg/dL — ABNORMAL HIGH (ref 70–99)
Potassium: 3.2 mmol/L — ABNORMAL LOW (ref 3.5–5.1)
Sodium: 138 mmol/L (ref 135–145)

## 2024-09-26 LAB — GLUCOSE, CAPILLARY
Glucose-Capillary: 101 mg/dL — ABNORMAL HIGH (ref 70–99)
Glucose-Capillary: 160 mg/dL — ABNORMAL HIGH (ref 70–99)
Glucose-Capillary: 98 mg/dL (ref 70–99)
Glucose-Capillary: 113 mg/dL — ABNORMAL HIGH (ref 70–99)

## 2024-09-26 LAB — LEGIONELLA PNEUMOPHILA SEROGP 1 UR AG: L. pneumophila Serogp 1 Ur Ag: NEGATIVE

## 2024-09-26 LAB — MAGNESIUM: Magnesium: 2 mg/dL (ref 1.7–2.4)

## 2024-09-26 MED ORDER — INSULIN ASPART 100 UNIT/ML IJ SOLN
0.0000 [IU] | Freq: Three times a day (TID) | INTRAMUSCULAR | Status: DC
  Administered 2024-09-27: 1 [IU] via SUBCUTANEOUS
  Administered 2024-09-27: 2 [IU] via SUBCUTANEOUS
  Administered 2024-09-27 – 2024-09-28 (×3): 1 [IU] via SUBCUTANEOUS
  Administered 2024-09-28 – 2024-09-29 (×2): 2 [IU] via SUBCUTANEOUS
  Administered 2024-09-29: 1 [IU] via SUBCUTANEOUS
  Administered 2024-09-29 – 2024-09-30 (×2): 2 [IU] via SUBCUTANEOUS
  Administered 2024-09-30: 1 [IU] via SUBCUTANEOUS
  Filled 2024-09-26: qty 1
  Filled 2024-09-26: qty 2
  Filled 2024-09-26 (×3): qty 1
  Filled 2024-09-26 (×2): qty 2
  Filled 2024-09-26 (×2): qty 1
  Filled 2024-09-26: qty 7
  Filled 2024-09-26: qty 2

## 2024-09-26 MED ORDER — POTASSIUM CHLORIDE CRYS ER 20 MEQ PO TBCR
40.0000 meq | EXTENDED_RELEASE_TABLET | Freq: Two times a day (BID) | ORAL | Status: AC
  Administered 2024-09-26 (×2): 40 meq via ORAL
  Filled 2024-09-26 (×2): qty 2

## 2024-09-26 NOTE — Progress Notes (Signed)
 "   Progress Note  Patient Name: Justin [name] Date of Encounter: 09/26/2024  Primary Cardiologist: [name], MD  Interval Summary  Chart reviewed.  Patient in bedside chair today.  States that he feels somewhat better.  No shortness of breath at rest and no sense of palpitations.  He remains in atrial fibrillation.  Net urine output approximately 1100 cc last 24 hours.  Creatinine 1.36 up from 1.27.  Systolic running 899-879.  Vital Signs  Vitals:   09/25/24 1854 09/25/24 2029 09/25/24 2301 09/26/24 0355  BP: 116/65 (!) 98/59 (!) 128/59 120/77  Pulse:  81 100 (!) 105  Resp:  20 13 20   Temp:  97.6 F (36.4 C) 98.1 F (36.7 C) 97.7 F (36.5 C)  TempSrc:  Oral Oral Oral  SpO2:   94% 94%  Weight:    124.7 kg    Intake/Output Summary (Last 24 hours) at 09/26/2024 9061 Last data filed at 09/26/2024 9374 Gross per 24 hour  Intake 240 ml  Output 1300 ml  Net -1060 ml   Filed Weights   09/25/24 0340 09/26/24 0355  Weight: 124 kg 124.7 kg    Physical Exam  GEN: Elderly male, no acute distress.   Neck: No JVD. Cardiac: Indistinct PMI, irregularly irregular without gallop.  Respiratory: Nonlabored. Clear to auscultation bilaterally. GI: Soft, nontender, bowel sounds present. MS: Peripheral edema, Unna boots placed.  ECG/Telemetry  Telemetry reviewed showing atrial fibrillation with intermittent ventricular pacing.  Labs  Chemistry Recent Labs  Lab 09/23/24 1418 09/23/24 1430 09/24/24 0405 09/25/24 0238 09/26/24 0140  NA 141   < > 141 138 138  K 3.4*   < > 3.3* 3.6 3.2*  CL 99   < > 100 99 97*  CO2 31  --  29 28 27   GLUCOSE 102*   < > 106* 117* 107*  BUN 20   < > 18 19 19   CREATININE 1.33*   < > 1.26* 1.27* 1.36*  CALCIUM  8.4*  --  8.3* 8.1* 8.0*  PROT 6.6  --  6.2*  --   --   ALBUMIN  2.9*  --  2.5*  --   --   AST 40  --  30  --   --   ALT 22  --  21  --   --   ALKPHOS 180*  --  132*  --   --   BILITOT 0.7  --  0.8  --   --   GFRNONAA 51*  --   55* 54* 50*  ANIONGAP 11  --  13 12 13    < > = values in this interval not displayed.    Hematology Recent Labs  Lab 09/23/24 1418 09/23/24 1430 09/24/24 0405 09/25/24 0238  WBC 10.3  --  10.2 9.6  RBC 4.08*  --  4.02* 3.83*  HGB 11.0* 11.9*  11.9* 10.9* 10.4*  HCT 35.1* 35.0*  35.0* 35.3* 33.3*  MCV 86.0  --  87.8 86.9  MCH 27.0  --  27.1 27.2  MCHC 31.3  --  30.9 31.2  RDW 18.2*  --  18.5* 18.4*  PLT 312  --  297 281   Cardiac Enzymes Recent Labs  Lab 09/08/24 0656 09/10/24 1332 09/10/24 1439 09/23/24 1418 09/23/24 1646  TRNPT 47* 47* 46* 43* 43*    Lipid Panel     Component Value Date/Time   CHOL 108 10/14/2023 0729   CHOL 171 08/11/2018 0735   TRIG 113 10/14/2023 0729  TRIG 143 06/30/2006 1132   HDL 33 (L) 10/14/2023 0729   HDL 34 (L) 08/11/2018 0735   CHOLHDL 3.3 10/14/2023 0729   VLDL 19 07/03/2023 0426   LDLCALC 55 10/14/2023 0729   LABVLDL 64 (H) 08/11/2018 0735    Cardiac Studies  Echocardiogram 09/24/2024:  1. This study was somewhat limited by poor sound transmission. The RV  systolic function appears to be severely reduced, but it was overall  poorly visualized.   2. Left ventricular ejection fraction, by estimation, is 35 to 40%. Left  ventricular ejection fraction by 2D MOD biplane is 42.3 %. The left  ventricle has moderately decreased function. The left ventricle  demonstrates regional wall motion abnormalities  (see scoring diagram/findings for description). Left ventricular diastolic  parameters are consistent with Grade III diastolic dysfunction  (restrictive). Abnormal septal bounce due to pacing.   3. The IVC was not well visualized for RVSP assessment. Right ventricular  systolic function is severely reduced. The right ventricular size is not  well visualized.   4. Left atrial size was severely dilated.   5. The mitral valve is degenerative. Trivial mitral valve regurgitation.  No evidence of mitral stenosis. Moderate mitral  annular calcification.   6. Tricuspid valve regurgitation is mild to moderate.   7. The aortic valve has been repaired/replaced. Aortic valve  regurgitation is not visualized. No aortic stenosis is present. There is a  29 mm Sapien prosthetic (TAVR) valve present in the aortic position.  Procedure Date: 03/04/2017. Echo findings are  consistent with normal structure and function of the aortic valve  prosthesis. Aortic valve mean gradient measures 5.5 mmHg. Aortic valve  Vmax measures 1.49 m/s.   Assessment & Plan  1.  Acute on chronic HFrEF, LVEF 35 to 40% with restrictive diastolic filling pattern and significant RV dysfunction by follow-up echocardiogram.  GDMT currently limited to Aldactone  and Lasix .  Beta-blocker held initially with elevated lactate.  2.  History of aortic stenosis status post TAVR, 29 mm SAPIEN prosthesis stable with mean AV gradient 5.5 mmHg and no PVL.  3.  Multivessel CAD status post CABG in 2007, no active angina.  Continues on Lipitor 20 mg daily.  4.  Permanent atrial fibrillation and frequent PVCs.  CHA2DS2-VASc score is 5.  Currently on Pradaxa  for stroke prophylaxis.  Also amiodarone  for rate control.  Continue Lasix  80 mg IV twice daily, Aldactone  25 mg daily, amiodarone  200 mg daily, and Pradaxa  150 mg twice daily.  Recheck lactate, may be able to eventually add low-dose bisoprolol for heart rate control of atrial fibrillation.  Other GDMT limited as already indicated in HF consultation note.  Not candidate for advanced HF strategies.  For questions or updates, please contact [facility] Please consult [email or web address] for contact info under   Signed, Jayson Sierras, MD  09/26/2024, 9:38 AM    "

## 2024-09-26 NOTE — Progress Notes (Addendum)
 " PROGRESS NOTE    Justin [name]  FMW:[id number] DOB: 1933-11-06 DOA: 09/23/2024 PCP: Mast, Man X, NP  89/M with chronic systolic CHF with RV failure, permanent atrial fibrillation, TAVR, [employer] ICD, CAD/CABG, CKD 3, recently admitted with ESBL E. coli urosepsis, AKI CKD 3, GDMT was discontinued, seen in heart failure clinic 1/26 noted to have fluid retention, restarted on torsemide . - Back in the ED 1/29 with recurrent falls, volume overload by OptiVol, labs noted creatinine of 1.3, proBNP 3300, troponin 43, lactate 2.4, CT head with chronic disease.  Patient reports 2 of his falls were where he slid down from a chair and last episode yesterday was a true fall, history of orthostatic symptoms as well - Admitted, started on diuretics, cards following - Palliative care consulted  Subjective: - Feels better overall, breathing continues to improve, concerned about his leg weakness  Assessment and Plan:  Acute on chronic systolic CHF - Last echo 3/25 with EF 40-45%, moderately reduced RV - Repeat echo this admission with EF 35-40%, grade 3 DD, restrictive, severely reduced RV - Recently management complicated by AKI, orthostasis etc. - Followed by CHF team,  concerns that he may be nearing end-stage, he is DNR, palliative consulted -Cardiology following, urine output inaccurate,  - Improving with diuresis, continue IV Lasix , Aldactone  , holding beta-blocker, lactate was elevated, discussed with cards, poor candidate for Inotropes etc - Increase activity, PT eval, BMP in a.m. - Discussed poor prognosis with patient and daughter, seen by palliative care, goal is for medical optimization, continue to treat the treatable, outpatient palliative care referral  Frequent falls - Does not have any localizing signs, CT head, C-spine with chronic disease - Has some orthostatic symptoms as well, recheck orthostatic vitals, B12 is normal - PT OT eval> may need SNF - Suspect his falls are  multifactorial,   Permanent A-fib -With RVR - Beta-blocker on hold now, continue amiodarone , Pradaxa  - Resume beta-blocker when volume status is better  CAD/CABG - Continue atorvastatin , Pradaxa   TAVR  Hypothyroidism  COPD - Stable continue home nebs  Type 2 diabetes mellitus - Recent A1c was 6.2, SSI for now  CKD 3A - Stable, monitor, recent admission with AKI  DVT prophylaxis: Pradaxa  Code Status: DNR Family Communication: No family at bedside, updated daughter yesterday Disposition Plan: Need SNF  Consultants:    Procedures:   Antimicrobials:    Objective: Vitals:   09/25/24 2301 09/26/24 0355 09/26/24 0940 09/26/24 1056  BP: (!) 128/59 120/77 122/77 116/87  Pulse: 100 (!) 105 (!) 116 100  Resp: 13 20 (!) 24 (!) 21  Temp: 98.1 F (36.7 C) 97.7 F (36.5 C) 97.7 F (36.5 C) 98.2 F (36.8 C)  TempSrc: Oral Oral Oral Oral  SpO2: 94% 94% 97% 96%  Weight:  124.7 kg      Intake/Output Summary (Last 24 hours) at 09/26/2024 1205 Last data filed at 09/26/2024 0954 Gross per 24 hour  Intake 483 ml  Output 1200 ml  Net -717 ml   Filed Weights   09/25/24 0340 09/26/24 0355  Weight: 124 kg 124.7 kg    Examination:  General exam: Appears calm and comfortable, AAO x 3, no distress Respiratory system: Improving air movement, decreased at the bases Cardiovascular system: S1 & S2 heard, irregular Abd: nondistended, soft and nontender.Normal bowel sounds heard. Central nervous system: Alert and oriented. No focal neurological deficits. Extremities: 1+ edema Skin: No rashes Psychiatry:  Mood & affect appropriate.     Data Reviewed:  CBC: Recent Labs  Lab 09/23/24 1418 09/23/24 1430 09/24/24 0405 09/25/24 0238  WBC 10.3  --  10.2 9.6  NEUTROABS 6.7  --   --   --   HGB 11.0* 11.9*  11.9* 10.9* 10.4*  HCT 35.1* 35.0*  35.0* 35.3* 33.3*  MCV 86.0  --  87.8 86.9  PLT 312  --  297 281   Basic Metabolic Panel: Recent Labs  Lab 09/23/24 1418  09/23/24 1430 09/24/24 0405 09/25/24 0238 09/26/24 0139 09/26/24 0140  NA 141 141  141 141 138  --  138  K 3.4* 3.3*  3.3* 3.3* 3.6  --  3.2*  CL 99 97* 100 99  --  97*  CO2 31  --  29 28  --  27  GLUCOSE 102* 101* 106* 117*  --  107*  BUN 20 21 18 19   --  19  CREATININE 1.33* 1.40* 1.26* 1.27*  --  1.36*  CALCIUM  8.4*  --  8.3* 8.1*  --  8.0*  MG 1.9  --  1.8  --  2.0  --   PHOS  --   --  3.4  --   --   --    GFR: Estimated Creatinine Clearance: 46.8 mL/min (A) (by C-G formula based on SCr of 1.36 mg/dL (H)). Liver Function Tests: Recent Labs  Lab 09/23/24 1418 09/24/24 0405  AST 40 30  ALT 22 21  ALKPHOS 180* 132*  BILITOT 0.7 0.8  PROT 6.6 6.2*  ALBUMIN  2.9* 2.5*   No results for input(s): LIPASE, AMYLASE in the last 168 hours. No results for input(s): AMMONIA in the last 168 hours. Coagulation Profile: No results for input(s): INR, PROTIME in the last 168 hours. Cardiac Enzymes: No results for input(s): CKTOTAL, CKMB, CKMBINDEX, TROPONINI in the last 168 hours. BNP (last 3 results) Recent Labs    08/30/24 1150 09/07/24 1655 09/23/24 1418  PROBNP 4,372* 2,178.0* 3,327.0*   HbA1C: No results for input(s): HGBA1C in the last 72 hours. CBG: Recent Labs  Lab 09/25/24 1648 09/25/24 2032 09/25/24 2302 09/26/24 0354 09/26/24 1054  GLUCAP 107* 134* 88 101* 160*   Lipid Profile: No results for input(s): CHOL, HDL, LDLCALC, TRIG, CHOLHDL, LDLDIRECT in the last 72 hours. Thyroid  Function Tests: Recent Labs    09/24/24 0405  TSH 1.680   Anemia Panel: Recent Labs    09/24/24 0856  VITAMINB12 842   Urine analysis:    Component Value Date/Time   COLORURINE STRAW (A) 09/23/2024 1359   APPEARANCEUR CLEAR 09/23/2024 1359   LABSPEC 1.005 09/23/2024 1359   PHURINE 5.0 09/23/2024 1359   GLUCOSEU NEGATIVE 09/23/2024 1359   GLUCOSEU NEGATIVE 05/07/2023 1415   HGBUR NEGATIVE 09/23/2024 1359   HGBUR negative 09/05/2010 0810    BILIRUBINUR NEGATIVE 09/23/2024 1359   BILIRUBINUR NEG 03/20/2022 1121   KETONESUR NEGATIVE 09/23/2024 1359   PROTEINUR NEGATIVE 09/23/2024 1359   UROBILINOGEN 0.2 05/07/2023 1415   NITRITE NEGATIVE 09/23/2024 1359   LEUKOCYTESUR NEGATIVE 09/23/2024 1359   Sepsis Labs: @LABRCNTIP (procalcitonin:4,lacticidven:4)  ) Recent Results (from the past 240 hours)  Culture, blood (routine x 2)     Status: None (Preliminary result)   Collection Time: 09/23/24  2:10 PM   Specimen: BLOOD LEFT ARM  Result Value Ref Range Status   Specimen Description BLOOD LEFT ARM  Final   Special Requests   Final    BOTTLES DRAWN AEROBIC AND ANAEROBIC Blood Culture adequate volume   Culture   Final  NO GROWTH 3 DAYS Performed at [facility], [address], [place], [place] [address]    Report Status PENDING  Incomplete  Resp panel by RT-PCR (RSV, Flu A&B, Covid) Anterior Nasal Swab     Status: None   Collection Time: 09/23/24 11:26 PM   Specimen: Anterior Nasal Swab  Result Value Ref Range Status   SARS Coronavirus 2 by RT PCR NEGATIVE NEGATIVE Final   Influenza A by PCR NEGATIVE NEGATIVE Final   Influenza B by PCR NEGATIVE NEGATIVE Final    Comment: (NOTE) The Xpert Xpress SARS-CoV-2/FLU/RSV plus assay is intended as an aid in the diagnosis of influenza from Nasopharyngeal swab specimens and should not be used as a sole basis for treatment. Nasal washings and aspirates are unacceptable for Xpert Xpress SARS-CoV-2/FLU/RSV testing.  Fact Sheet for Patients: bloggercourse.com  Fact Sheet for Healthcare Providers: seriousbroker.it  This test is not yet approved or cleared by the [place]  FDA and has been authorized for detection and/or diagnosis of SARS-CoV-2 by FDA under an Emergency Use Authorization (EUA). This EUA will remain in effect (meaning this test can be used) for the duration of the COVID-19 declaration under Section  564(b)(1) of the Act, 21 U.S.C. section 360bbb-3(b)(1), unless the authorization is terminated or revoked.     Resp Syncytial Virus by PCR NEGATIVE NEGATIVE Final    Comment: (NOTE) Fact Sheet for Patients: bloggercourse.com  Fact Sheet for Healthcare Providers: seriousbroker.it  This test is not yet approved or cleared by the [place]  FDA and has been authorized for detection and/or diagnosis of SARS-CoV-2 by FDA under an Emergency Use Authorization (EUA). This EUA will remain in effect (meaning this test can be used) for the duration of the COVID-19 declaration under Section 564(b)(1) of the Act, 21 U.S.C. section 360bbb-3(b)(1), unless the authorization is terminated or revoked.  Performed at [facility], [address] [place], [place] [address]   Culture, blood (routine x 2)     Status: None (Preliminary result)   Collection Time: 09/23/24 11:29 PM   Specimen: BLOOD LEFT HAND  Result Value Ref Range Status   Specimen Description BLOOD LEFT HAND  Final   Special Requests   Final    BOTTLES DRAWN AEROBIC ONLY Blood Culture adequate volume   Culture   Final    NO GROWTH 3 DAYS Performed at [facility], [address], [place], [place] [address]    Report Status PENDING  Incomplete     Radiology Studies: No results found.    Scheduled Meds:  allopurinol   100 mg Oral Daily   amiodarone   200 mg Oral Daily   atorvastatin   20 mg Oral Daily   dabigatran   150 mg Oral BID   docusate sodium   100 mg Oral BID   ferrous sulfate   325 mg Oral Q breakfast   furosemide   80 mg Intravenous BID   insulin  aspart  0-9 Units Subcutaneous Q4H   levothyroxine   175 mcg Oral QAC breakfast   montelukast   10 mg Oral QHS   pantoprazole   40 mg Oral BID   potassium chloride   40 mEq Oral BID   sodium chloride  flush  3 mL Intravenous Q12H   spironolactone   25 mg Oral Daily   tamsulosin   0.4 mg Oral QHS   venlafaxine  XR   37.5 mg Oral Daily   Continuous Infusions:     LOS: 3 days    Time spent:    Sigurd Pac, MD  [facility]   09/26/2024, 12:05 PM    "

## 2024-09-26 NOTE — Plan of Care (Signed)
   Problem: Education: Goal: Knowledge of General Education information will improve Description: Including pain rating scale, medication(s)/side effects and non-pharmacologic comfort measures Outcome: Progressing   Problem: Safety: Goal: Ability to remain free from injury will improve Outcome: Progressing

## 2024-09-26 NOTE — Plan of Care (Signed)
   Problem: Education: Goal: Ability to describe self-care measures that may prevent or decrease complications (Diabetes Survival Skills Education) will improve Outcome: Progressing Goal: Individualized Educational Video(s) Outcome: Progressing   Problem: Coping: Goal: Ability to adjust to condition or change in health will improve Outcome: Progressing   Problem: Fluid Volume: Goal: Ability to maintain a balanced intake and output will improve Outcome: Progressing   Problem: Health Behavior/Discharge Planning: Goal: Ability to identify and utilize available resources and services will improve Outcome: Progressing Goal: Ability to manage health-related needs will improve Outcome: Progressing   Problem: Metabolic: Goal: Ability to maintain appropriate glucose levels will improve Outcome: Progressing   Problem: Nutritional: Goal: Maintenance of adequate nutrition will improve Outcome: Progressing Goal: Progress toward achieving an optimal weight will improve Outcome: Progressing   Problem: Skin Integrity: Goal: Risk for impaired skin integrity will decrease Outcome: Progressing   Problem: Tissue Perfusion: Goal: Adequacy of tissue perfusion will improve Outcome: Progressing   Problem: Education: Goal: Knowledge of General Education information will improve Description: Including pain rating scale, medication(s)/side effects and non-pharmacologic comfort measures Outcome: Progressing   Problem: Health Behavior/Discharge Planning: Goal: Ability to manage health-related needs will improve Outcome: Progressing   Problem: Clinical Measurements: Goal: Ability to maintain clinical measurements within normal limits will improve Outcome: Progressing Goal: Will remain free from infection Outcome: Progressing Goal: Diagnostic test results will improve Outcome: Progressing Goal: Respiratory complications will improve Outcome: Progressing Goal: Cardiovascular complication will  be avoided Outcome: Progressing   Problem: Activity: Goal: Risk for activity intolerance will decrease Outcome: Progressing   Problem: Nutrition: Goal: Adequate nutrition will be maintained Outcome: Progressing   Problem: Coping: Goal: Level of anxiety will decrease Outcome: Progressing   Problem: Elimination: Goal: Will not experience complications related to bowel motility Outcome: Progressing Goal: Will not experience complications related to urinary retention Outcome: Progressing   Problem: Pain Managment: Goal: General experience of comfort will improve and/or be controlled Outcome: Progressing   Problem: Safety: Goal: Ability to remain free from injury will improve Outcome: Progressing   Problem: Skin Integrity: Goal: Risk for impaired skin integrity will decrease Outcome: Progressing   Problem: Education: Goal: Ability to demonstrate management of disease process will improve Outcome: Progressing Goal: Ability to verbalize understanding of medication therapies will improve Outcome: Progressing Goal: Individualized Educational Video(s) Outcome: Progressing   Problem: Activity: Goal: Capacity to carry out activities will improve Outcome: Progressing   Problem: Cardiac: Goal: Ability to achieve and maintain adequate cardiopulmonary perfusion will improve Outcome: Progressing   Problem: Activity: Goal: Ability to tolerate increased activity will improve Outcome: Progressing   Problem: Clinical Measurements: Goal: Ability to maintain a body temperature in the normal range will improve Outcome: Progressing   Problem: Respiratory: Goal: Ability to maintain adequate ventilation will improve Outcome: Progressing Goal: Ability to maintain a clear airway will improve Outcome: Progressing

## 2024-09-27 ENCOUNTER — Other Ambulatory Visit: Payer: Self-pay

## 2024-09-27 ENCOUNTER — Inpatient Hospital Stay (HOSPITAL_COMMUNITY)

## 2024-09-27 DIAGNOSIS — I5020 Unspecified systolic (congestive) heart failure: Secondary | ICD-10-CM | POA: Diagnosis not present

## 2024-09-27 LAB — BASIC METABOLIC PANEL WITH GFR
Anion gap: 14 (ref 5–15)
Anion gap: 10 (ref 5–15)
BUN: 19 mg/dL (ref 8–23)
BUN: 17 mg/dL (ref 8–23)
CO2: 27 mmol/L (ref 22–32)
CO2: 29 mmol/L (ref 22–32)
Calcium: 8 mg/dL — ABNORMAL LOW (ref 8.9–10.3)
Calcium: 7.8 mg/dL — ABNORMAL LOW (ref 8.9–10.3)
Chloride: 96 mmol/L — ABNORMAL LOW (ref 98–111)
Chloride: 97 mmol/L — ABNORMAL LOW (ref 98–111)
Creatinine, Ser: 1.45 mg/dL — ABNORMAL HIGH (ref 0.61–1.24)
Creatinine, Ser: 1.43 mg/dL — ABNORMAL HIGH (ref 0.61–1.24)
GFR, Estimated: 46 mL/min — ABNORMAL LOW
GFR, Estimated: 47 mL/min — ABNORMAL LOW
Glucose, Bld: 114 mg/dL — ABNORMAL HIGH (ref 70–99)
Glucose, Bld: 134 mg/dL — ABNORMAL HIGH (ref 70–99)
Potassium: 3.3 mmol/L — ABNORMAL LOW (ref 3.5–5.1)
Potassium: 3.2 mmol/L — ABNORMAL LOW (ref 3.5–5.1)
Sodium: 136 mmol/L (ref 135–145)
Sodium: 136 mmol/L (ref 135–145)

## 2024-09-27 LAB — COOXEMETRY PANEL
Carboxyhemoglobin: 1.8 % — ABNORMAL HIGH (ref 0.5–1.5)
Methemoglobin: <0.7 % (ref 0.0–1.5)
O2 Saturation: 59.8 %
Total hemoglobin: 10.4 g/dL — ABNORMAL LOW (ref 12.0–16.0)

## 2024-09-27 LAB — GLUCOSE, CAPILLARY
Glucose-Capillary: 127 mg/dL — ABNORMAL HIGH (ref 70–99)
Glucose-Capillary: 125 mg/dL — ABNORMAL HIGH (ref 70–99)
Glucose-Capillary: 154 mg/dL — ABNORMAL HIGH (ref 70–99)
Glucose-Capillary: 119 mg/dL — ABNORMAL HIGH (ref 70–99)

## 2024-09-27 LAB — LACTIC ACID, PLASMA: Lactic Acid, Venous: 4.1 mmol/L (ref 0.5–1.9)

## 2024-09-27 MED ORDER — POTASSIUM CHLORIDE CRYS ER 20 MEQ PO TBCR
40.0000 meq | EXTENDED_RELEASE_TABLET | ORAL | Status: AC
  Administered 2024-09-27 – 2024-09-28 (×3): 40 meq via ORAL
  Filled 2024-09-27 (×3): qty 2

## 2024-09-27 MED ORDER — AMIODARONE HCL IN DEXTROSE 360-4.14 MG/200ML-% IV SOLN
60.0000 mg/h | INTRAVENOUS | Status: DC
  Administered 2024-09-27: 30 mg/h via INTRAVENOUS
  Administered 2024-09-28 (×2): 60 mg/h via INTRAVENOUS
  Administered 2024-09-28: 30 mg/h via INTRAVENOUS
  Administered 2024-09-28 – 2024-09-30 (×6): 60 mg/h via INTRAVENOUS
  Filled 2024-09-27 (×14): qty 200

## 2024-09-27 MED ORDER — FUROSEMIDE 10 MG/ML IJ SOLN
15.0000 mg/h | INTRAVENOUS | Status: AC
  Administered 2024-09-27 – 2024-09-28 (×2): 15 mg/h via INTRAVENOUS
  Filled 2024-09-27 (×2): qty 20

## 2024-09-27 MED ORDER — MILRINONE LACTATE IN DEXTROSE 20-5 MG/100ML-% IV SOLN
0.2500 ug/kg/min | INTRAVENOUS | Status: DC
  Administered 2024-09-27 – 2024-09-30 (×7): 0.25 ug/kg/min via INTRAVENOUS
  Filled 2024-09-27 (×8): qty 100

## 2024-09-27 MED ORDER — METOLAZONE 2.5 MG PO TABS
2.5000 mg | ORAL_TABLET | Freq: Once | ORAL | Status: AC
  Administered 2024-09-27: 2.5 mg via ORAL
  Filled 2024-09-27: qty 1

## 2024-09-27 MED ORDER — SODIUM CHLORIDE 0.9% FLUSH
10.0000 mL | Freq: Two times a day (BID) | INTRAVENOUS | Status: AC
  Administered 2024-09-27 – 2024-10-01 (×9): 10 mL

## 2024-09-27 MED ORDER — CHLORHEXIDINE GLUCONATE CLOTH 2 % EX PADS
6.0000 | MEDICATED_PAD | Freq: Every day | CUTANEOUS | Status: DC
  Administered 2024-09-27 – 2024-10-01 (×5): 6 via TOPICAL

## 2024-09-27 MED ORDER — POTASSIUM CHLORIDE CRYS ER 20 MEQ PO TBCR
40.0000 meq | EXTENDED_RELEASE_TABLET | ORAL | Status: AC
  Administered 2024-09-27 (×2): 40 meq via ORAL
  Filled 2024-09-27 (×2): qty 2

## 2024-09-27 MED ORDER — SODIUM CHLORIDE 0.9% FLUSH: 10.0000 mL | INTRAVENOUS | Status: AC | PRN

## 2024-09-27 NOTE — TOC Progression Note (Addendum)
 Transition of Care [facility]) - Progression Note    Patient Details  Name: Justin [name] MRN: [id number] Date of Birth: 04/01/1934  Transition of Care [facility]) CM/SW Contact  Arlana JINNY Nicholaus [place] Phone Number: [phone] 09/27/2024, 1:29 PM  Clinical Narrative:   Per chart review, patient is not medically stable for dc. Plan is for patient to return to [facility] once ready. ICM will continue to follow.   HF CSW/Unit CM will continue to follow and monitor for dc readiness.     Expected Discharge Plan: Assisted Living ([facility]) Barriers to Discharge: Continued Medical Work up               Expected Discharge Plan and Services       Living arrangements for the past 2 months: Assisted Living Facility ([facility])                                       Social Drivers of Health (SDOH) Interventions SDOH Screenings   Food Insecurity: No Food Insecurity (09/24/2024)  Housing: Low Risk (09/24/2024)  Transportation Needs: No Transportation Needs (09/24/2024)  Utilities: Not At Risk (09/24/2024)  Alcohol  Screen: Low Risk (03/31/2023)  Depression (PHQ2-9): Low Risk (04/05/2024)  Financial Resource Strain: Low Risk (03/31/2023)  Physical Activity: Sufficiently Active (03/31/2023)  Social Connections: Unknown (09/24/2024)  Stress: No Stress Concern Present (03/31/2023)  Tobacco Use: Medium Risk (09/23/2024)  Health Literacy: Adequate Health Literacy (03/31/2023)    Readmission Risk Interventions    09/12/2024   10:13 AM 08/05/2024   11:56 AM 11/19/2023    1:00 PM  Readmission Risk Prevention Plan  Transportation Screening Complete Complete Complete  PCP or Specialist Appt within 3-5 Days   Complete  HRI or Home Care Consult   Complete  Social Work Consult for Recovery Care Planning/Counseling   Complete  Palliative Care Screening   Not Applicable  Medication Review Oceanographer) Complete Complete Complete  PCP or Specialist appointment within 3-5 days  of discharge Complete Complete   HRI or Home Care Consult Complete Complete   SW Recovery Care/Counseling Consult Complete Complete   Palliative Care Screening Not Applicable Not Applicable   Skilled Nursing Facility Complete Not Applicable

## 2024-09-27 NOTE — Plan of Care (Signed)
   Problem: Education: Goal: Knowledge of General Education information will improve Description: Including pain rating scale, medication(s)/side effects and non-pharmacologic comfort measures Outcome: Progressing   Problem: Clinical Measurements: Goal: Cardiovascular complication will be avoided Outcome: Progressing

## 2024-09-28 ENCOUNTER — Inpatient Hospital Stay (HOSPITAL_COMMUNITY)

## 2024-09-28 LAB — COOXEMETRY PANEL
Carboxyhemoglobin: 2.5 % — ABNORMAL HIGH (ref 0.5–1.5)
Carboxyhemoglobin: 1.9 % — ABNORMAL HIGH (ref 0.5–1.5)
Methemoglobin: <0.7 % (ref 0.0–1.5)
Methemoglobin: <0.7 % (ref 0.0–1.5)
O2 Saturation: 63.8 %
O2 Saturation: 66.7 %
Total hemoglobin: 10.4 g/dL — ABNORMAL LOW (ref 12.0–16.0)
Total hemoglobin: 10.1 g/dL — ABNORMAL LOW (ref 12.0–16.0)

## 2024-09-28 LAB — CULTURE, BLOOD (ROUTINE X 2)
Culture: NO GROWTH
Culture: NO GROWTH
Special Requests: ADEQUATE
Special Requests: ADEQUATE

## 2024-09-28 LAB — MAGNESIUM: Magnesium: 2.1 mg/dL (ref 1.7–2.4)

## 2024-09-28 LAB — BASIC METABOLIC PANEL WITH GFR
Anion gap: 10 (ref 5–15)
Anion gap: 11 (ref 5–15)
BUN: 16 mg/dL (ref 8–23)
BUN: 17 mg/dL (ref 8–23)
CO2: 31 mmol/L (ref 22–32)
CO2: 31 mmol/L (ref 22–32)
Calcium: 8.4 mg/dL — ABNORMAL LOW (ref 8.9–10.3)
Calcium: 8.3 mg/dL — ABNORMAL LOW (ref 8.9–10.3)
Chloride: 96 mmol/L — ABNORMAL LOW (ref 98–111)
Chloride: 93 mmol/L — ABNORMAL LOW (ref 98–111)
Creatinine, Ser: 1.51 mg/dL — ABNORMAL HIGH (ref 0.61–1.24)
Creatinine, Ser: 1.53 mg/dL — ABNORMAL HIGH (ref 0.61–1.24)
GFR, Estimated: 44 mL/min — ABNORMAL LOW
GFR, Estimated: 43 mL/min — ABNORMAL LOW
Glucose, Bld: 140 mg/dL — ABNORMAL HIGH (ref 70–99)
Glucose, Bld: 221 mg/dL — ABNORMAL HIGH (ref 70–99)
Potassium: 3.4 mmol/L — ABNORMAL LOW (ref 3.5–5.1)
Potassium: 3.2 mmol/L — ABNORMAL LOW (ref 3.5–5.1)
Sodium: 137 mmol/L (ref 135–145)
Sodium: 135 mmol/L (ref 135–145)

## 2024-09-28 LAB — LACTIC ACID, PLASMA: Lactic Acid, Venous: 1.8 mmol/L (ref 0.5–1.9)

## 2024-09-28 LAB — GLUCOSE, CAPILLARY
Glucose-Capillary: 136 mg/dL — ABNORMAL HIGH (ref 70–99)
Glucose-Capillary: 139 mg/dL — ABNORMAL HIGH (ref 70–99)
Glucose-Capillary: 179 mg/dL — ABNORMAL HIGH (ref 70–99)

## 2024-09-28 MED ORDER — FUROSEMIDE 10 MG/ML IJ SOLN: 15.0000 mg/h | INTRAVENOUS | Status: DC

## 2024-09-28 MED ORDER — FUROSEMIDE 10 MG/ML IJ SOLN
15.0000 mg/h | INTRAVENOUS | Status: AC
  Administered 2024-09-28 – 2024-09-29 (×2): 15 mg/h via INTRAVENOUS
  Filled 2024-09-28: qty 20
  Filled 2024-09-28: qty 200

## 2024-09-28 MED ORDER — SPIRONOLACTONE 25 MG PO TABS
50.0000 mg | ORAL_TABLET | Freq: Every day | ORAL | Status: DC
  Administered 2024-09-28 – 2024-09-30 (×3): 50 mg via ORAL
  Filled 2024-09-28 (×4): qty 2

## 2024-09-28 MED ORDER — PROCHLORPERAZINE EDISYLATE 10 MG/2ML IJ SOLN
5.0000 mg | Freq: Once | INTRAMUSCULAR | Status: AC
  Administered 2024-09-28: 5 mg via INTRAVENOUS
  Filled 2024-09-28: qty 2

## 2024-09-28 MED ORDER — POTASSIUM CHLORIDE CRYS ER 20 MEQ PO TBCR
40.0000 meq | EXTENDED_RELEASE_TABLET | Freq: Four times a day (QID) | ORAL | Status: DC
  Administered 2024-09-28 (×2): 40 meq via ORAL
  Filled 2024-09-28 (×2): qty 2

## 2024-09-28 NOTE — Plan of Care (Signed)
" °  Problem: Fluid Volume: Goal: Ability to maintain a balanced intake and output will improve Outcome: Progressing   Problem: Metabolic: Goal: Ability to maintain appropriate glucose levels will improve Outcome: Progressing   Problem: Skin Integrity: Goal: Risk for impaired skin integrity will decrease Outcome: Progressing   Problem: Tissue Perfusion: Goal: Adequacy of tissue perfusion will improve Outcome: Progressing   Problem: Clinical Measurements: Goal: Ability to maintain clinical measurements within normal limits will improve Outcome: Progressing Goal: Respiratory complications will improve Outcome: Progressing   Problem: Safety: Goal: Ability to remain free from injury will improve Outcome: Progressing   Problem: Education: Goal: Ability to demonstrate management of disease process will improve Outcome: Progressing Goal: Ability to verbalize understanding of medication therapies will improve Outcome: Progressing   Problem: Respiratory: Goal: Ability to maintain adequate ventilation will improve Outcome: Progressing   "

## 2024-09-28 NOTE — Progress Notes (Signed)
 " PROGRESS NOTE    Justin [name]  FMW:[id number] DOB: [birth year] DOA: 09/23/2024 PCP: Mast, Man X, NP  89/M with chronic systolic CHF with RV failure, permanent atrial fibrillation, TAVR, [employer] ICD, CAD/CABG, CKD 3, recently admitted with ESBL E. coli urosepsis, AKI CKD 3, GDMT was discontinued, seen in heart failure clinic 1/26 noted to have fluid retention, restarted on torsemide . - Back in the ED 1/29 with recurrent falls, volume overload by OptiVol, labs noted creatinine of 1.3, proBNP 3300, troponin 43, lactate 2.4, CT head with chronic disease.  Patient reports 2 of his falls were where he slid down from a chair and last episode yesterday was a true fall, history of orthostatic symptoms as well - Admitted, started on diuretics, cards following - Palliative care consulted - 2/1, lactate trending up - 2/2 started on milrinone  and Lasix  gtt.  Subjective: - Feels better overall, sitting up in the recliner, breathing slowly improving  Assessment and Plan:  Acute on chronic systolic CHF - Last echo 3/25 with EF 40-45%, moderately reduced RV - Repeat echo this admission with EF 35-40%, grade 3 DD, restrictive, severely reduced RV - Recently management complicated by AKI, orthostasis etc. - Followed by CHF team,  concerns that he may be nearing end-stage, he is DNR, palliative consulted - Cards following,, lactate remains elevated, beta-blocker on hold, started Lasix  and milrinone  yesterday - This is his third hospitalization in 1 to 2 months, palliative consulted and following - Discussed with daughter again today  Frequent falls - Does not have any localizing signs, CT head, C-spine with chronic disease - Has some orthostatic symptoms as well, B12 is normal - PT OT eval> may need SNF - Suspect his falls are multifactorial,   Permanent A-fib -With RVR - Beta-blocker on hold now, continue amiodarone , Pradaxa  - Resume beta-blocker when volume status is better - Now starting  Amio gtt.  CAD/CABG - Continue atorvastatin , Pradaxa   TAVR  Hypothyroidism  COPD - Stable continue home nebs  Type 2 diabetes mellitus - Recent A1c was 6.2, SSI for now  CKD 3A - Stable, monitor, recent admission with AKI  DVT prophylaxis: Pradaxa  Code Status: DNR Family Communication: No family at bedside, updated daughter Sherrilyn Disposition Plan: Need SNF  Consultants:    Procedures:   Antimicrobials:    Objective: Vitals:   09/28/24 0324 09/28/24 0400 09/28/24 0706 09/28/24 0846  BP: 96/73   (!) 103/55  Pulse: 97   (!) 114  Resp: 20 (!) 28 20 20   Temp: 97.7 F (36.5 C)   97.9 F (36.6 C)  TempSrc: Oral  Oral Oral  SpO2: 93%   94%  Weight: 112.9 kg     Height:    5' 6 (1.676 m)    Intake/Output Summary (Last 24 hours) at 09/28/2024 0948 Last data filed at 09/28/2024 9161 Gross per 24 hour  Intake 664.8 ml  Output 1400 ml  Net -735.2 ml   Filed Weights   09/26/24 0355 09/27/24 0312 09/28/24 0324  Weight: 124.7 kg 126.5 kg 112.9 kg    Examination:  General exam: Appears calm and comfortable, AAO x 3, no distress Respiratory system: Improving air movement, decreased at the bases Cardiovascular system: S1 & S2 heard, irregular Abd: nondistended, soft and nontender.Normal bowel sounds heard. Central nervous system: Alert and oriented. No focal neurological deficits. Extremities: 1+ edema Skin: No rashes Psychiatry:  Mood & affect appropriate.     Data Reviewed:   CBC: Recent Labs  Lab 09/23/24 1418 09/23/24  1430 09/24/24 0405 09/25/24 0238  WBC 10.3  --  10.2 9.6  NEUTROABS 6.7  --   --   --   HGB 11.0* 11.9*  11.9* 10.9* 10.4*  HCT 35.1* 35.0*  35.0* 35.3* 33.3*  MCV 86.0  --  87.8 86.9  PLT 312  --  297 281   Basic Metabolic Panel: Recent Labs  Lab 09/23/24 1418 09/23/24 1430 09/24/24 0405 09/25/24 0238 09/26/24 0139 09/26/24 0140 09/27/24 0117 09/27/24 1500 09/28/24 0440  NA 141   < > 141 138  --  138 136 136 137  K  3.4*   < > 3.3* 3.6  --  3.2* 3.3* 3.2* 3.4*  CL 99   < > 100 99  --  97* 96* 97* 96*  CO2 31  --  29 28  --  27 27 29 31   GLUCOSE 102*   < > 106* 117*  --  107* 114* 134* 140*  BUN 20   < > 18 19  --  19 19 17 16   CREATININE 1.33*   < > 1.26* 1.27*  --  1.36* 1.45* 1.43* 1.51*  CALCIUM  8.4*  --  8.3* 8.1*  --  8.0* 8.0* 7.8* 8.4*  MG 1.9  --  1.8  --  2.0  --   --   --  2.1  PHOS  --   --  3.4  --   --   --   --   --   --    < > = values in this interval not displayed.   GFR: Estimated Creatinine Clearance: 39.9 mL/min (A) (by C-G formula based on SCr of 1.51 mg/dL (H)). Liver Function Tests: Recent Labs  Lab 09/23/24 1418 09/24/24 0405  AST 40 30  ALT 22 21  ALKPHOS 180* 132*  BILITOT 0.7 0.8  PROT 6.6 6.2*  ALBUMIN  2.9* 2.5*   No results for input(s): LIPASE, AMYLASE in the last 168 hours. No results for input(s): AMMONIA in the last 168 hours. Coagulation Profile: No results for input(s): INR, PROTIME in the last 168 hours. Cardiac Enzymes: No results for input(s): CKTOTAL, CKMB, CKMBINDEX, TROPONINI in the last 168 hours. BNP (last 3 results) Recent Labs    08/30/24 1150 09/07/24 1655 09/23/24 1418  PROBNP 4,372* 2,178.0* 3,327.0*   HbA1C: No results for input(s): HGBA1C in the last 72 hours. CBG: Recent Labs  Lab 09/27/24 0614 09/27/24 1118 09/27/24 1631 09/27/24 2122 09/28/24 0610  GLUCAP 127* 125* 154* 119* 136*   Lipid Profile: No results for input(s): CHOL, HDL, LDLCALC, TRIG, CHOLHDL, LDLDIRECT in the last 72 hours. Thyroid  Function Tests: No results for input(s): TSH, T4TOTAL, FREET4, T3FREE, THYROIDAB in the last 72 hours.  Anemia Panel: No results for input(s): VITAMINB12, FOLATE, FERRITIN, TIBC, IRON, RETICCTPCT in the last 72 hours.  Urine analysis:    Component Value Date/Time   COLORURINE STRAW (A) 09/23/2024 1359   APPEARANCEUR CLEAR 09/23/2024 1359   LABSPEC 1.005 09/23/2024 1359    PHURINE 5.0 09/23/2024 1359   GLUCOSEU NEGATIVE 09/23/2024 1359   GLUCOSEU NEGATIVE 05/07/2023 1415   HGBUR NEGATIVE 09/23/2024 1359   HGBUR negative 09/05/2010 0810   BILIRUBINUR NEGATIVE 09/23/2024 1359   BILIRUBINUR NEG 03/20/2022 1121   KETONESUR NEGATIVE 09/23/2024 1359   PROTEINUR NEGATIVE 09/23/2024 1359   UROBILINOGEN 0.2 05/07/2023 1415   NITRITE NEGATIVE 09/23/2024 1359   LEUKOCYTESUR NEGATIVE 09/23/2024 1359   Sepsis Labs: @LABRCNTIP (procalcitonin:4,lacticidven:4)  ) Recent Results (from the past 240 hours)  Culture, blood (routine x 2)     Status: None   Collection Time: 09/23/24  2:10 PM   Specimen: BLOOD LEFT ARM  Result Value Ref Range Status   Specimen Description BLOOD LEFT ARM  Final   Special Requests   Final    BOTTLES DRAWN AEROBIC AND ANAEROBIC Blood Culture adequate volume   Culture   Final    NO GROWTH 5 DAYS Performed at [facility], [address], [place], [place] [address]    Report Status 09/28/2024 FINAL  Final  Resp panel by RT-PCR (RSV, Flu A&B, Covid) Anterior Nasal Swab     Status: None   Collection Time: 09/23/24 11:26 PM   Specimen: Anterior Nasal Swab  Result Value Ref Range Status   SARS Coronavirus 2 by RT PCR NEGATIVE NEGATIVE Final   Influenza A by PCR NEGATIVE NEGATIVE Final   Influenza B by PCR NEGATIVE NEGATIVE Final    Comment: (NOTE) The Xpert Xpress SARS-CoV-2/FLU/RSV plus assay is intended as an aid in the diagnosis of influenza from Nasopharyngeal swab specimens and should not be used as a sole basis for treatment. Nasal washings and aspirates are unacceptable for Xpert Xpress SARS-CoV-2/FLU/RSV testing.  Fact Sheet for Patients: bloggercourse.com  Fact Sheet for Healthcare Providers: seriousbroker.it  This test is not yet approved or cleared by the [place]  FDA and has been authorized for detection and/or diagnosis of SARS-CoV-2 by FDA under an  Emergency Use Authorization (EUA). This EUA will remain in effect (meaning this test can be used) for the duration of the COVID-19 declaration under Section 564(b)(1) of the Act, 21 U.S.C. section 360bbb-3(b)(1), unless the authorization is terminated or revoked.     Resp Syncytial Virus by PCR NEGATIVE NEGATIVE Final    Comment: (NOTE) Fact Sheet for Patients: bloggercourse.com  Fact Sheet for Healthcare Providers: seriousbroker.it  This test is not yet approved or cleared by the [place]  FDA and has been authorized for detection and/or diagnosis of SARS-CoV-2 by FDA under an Emergency Use Authorization (EUA). This EUA will remain in effect (meaning this test can be used) for the duration of the COVID-19 declaration under Section 564(b)(1) of the Act, 21 U.S.C. section 360bbb-3(b)(1), unless the authorization is terminated or revoked.  Performed at [facility], [address] [place], [place] [address]   Culture, blood (routine x 2)     Status: None   Collection Time: 09/23/24 11:29 PM   Specimen: BLOOD LEFT HAND  Result Value Ref Range Status   Specimen Description BLOOD LEFT HAND  Final   Special Requests   Final    BOTTLES DRAWN AEROBIC ONLY Blood Culture adequate volume   Culture   Final    NO GROWTH 5 DAYS Performed at [facility], [address] [place], [place] [address]    Report Status 09/28/2024 FINAL  Final     Radiology Studies: DG Chest Port 1 View Result Date: 09/27/2024 EXAM: 1 VIEW XRAY OF THE CHEST 09/27/2024 01:36:00 PM COMPARISON: 09/23/2024 CLINICAL HISTORY: PICC (peripherally inserted central catheter) in place. FINDINGS: LINES, TUBES AND DEVICES: Right PICC (peripherally inserted central catheter) with distal tip at superior cavoatrial junction. Left cardiac device stable in place. LUNGS AND PLEURA: Similar appearance of mild pulmonary vascular congestion. Left basilar opacity is  favored to reflect atelectasis. No pleural effusion. No pneumothorax. HEART AND MEDIASTINUM: Stable cardiomegaly. Aortic atherosclerosis. Status post median sternotomy and TAVR (Transcatheter Aortic Valve Replacement). BONES AND SOFT TISSUES: Status post  median sternotomy. IMPRESSION: 1. Right PICC in place with distal tip at the superior cavoatrial junction. 2. Mild pulmonary vascular congestion. 3. Left basilar opacity, favored to reflect atelectasis. Electronically signed by: Donnice Mania MD 09/27/2024 01:51 PM EST RP Workstation: [id number]   US  EKG SITE RITE Result Date: 09/27/2024 If Site Rite image not attached, placement could not be confirmed due to current cardiac rhythm.     Scheduled Meds:  allopurinol   100 mg Oral Daily   atorvastatin   20 mg Oral Daily   Chlorhexidine  Gluconate Cloth  6 each Topical Daily   dabigatran   150 mg Oral BID   docusate sodium   100 mg Oral BID   ferrous sulfate   325 mg Oral Q breakfast   insulin  aspart  0-9 Units Subcutaneous TID WC   levothyroxine   175 mcg Oral QAC breakfast   montelukast   10 mg Oral QHS   pantoprazole   40 mg Oral BID   potassium chloride   40 mEq Oral Q6H   sodium chloride  flush  10-40 mL Intracatheter Q12H   sodium chloride  flush  3 mL Intravenous Q12H   spironolactone   50 mg Oral Daily   tamsulosin   0.4 mg Oral QHS   venlafaxine  XR  37.5 mg Oral Daily   Continuous Infusions:  amiodarone  60 mg/hr (09/28/24 0823)   furosemide  (LASIX ) 200 mg in dextrose  5 % 100 mL (2 mg/mL) infusion 15 mg/hr (09/28/24 0207)   milrinone  0.25 mcg/kg/min (09/28/24 0202)      LOS: 5 days    Time spent:    Sigurd Pac, MD [facility]   09/28/2024, 9:48 AM    "

## 2024-09-28 NOTE — TOC Progression Note (Addendum)
 Transition of Care [facility]) - Progression Note    Patient Details  Name: Justin [name] MRN: [id number] Date of Birth: 1934-05-14  Transition [facility] Care [facility]) CM/SW Contact  Arlana JINNY Nicholaus [place] Phone Number: [phone] 09/28/2024, 2:01 PM  Clinical Narrative:   HF CSW called and spoke with the patients daughter to discuss SNF process and dispo plan. Patients daughter stated that she had a lot of questions and did not understand why SNF was needed if the patient has reached end stage. CSW notified HFNP that the patient has questions. Will updated about SNF.   HF CSW called and spoke with Lonell who stated if the patient has hospice services setup they will have to pay for SNF OOP. If the patient uses insurance and auth approved they could use ins. W/ palliative and later arrange hospice services.   HF CSW called the patient daughter back and left a VM asking to be called back.   HF CSW received a call back and CSW shared all of the details with the patients daughter [name]. Family is agreeable to SNF. Will start auht when patient is closer to being medically ready for dc.   HF CSW/Unit CM will continue to follow and monitor for dc readiness.   Expected Discharge Plan: Assisted Living ([facility]) Barriers to Discharge: Continued Medical Work up               Expected Discharge Plan and Services       Living arrangements for the past 2 months: Assisted Living Facility ([facility])                                       Social Drivers of Health (SDOH) Interventions SDOH Screenings   Food Insecurity: No Food Insecurity (09/24/2024)  Housing: Low Risk (09/24/2024)  Transportation Needs: No Transportation Needs (09/24/2024)  Utilities: Not At Risk (09/24/2024)  Alcohol  Screen: Low Risk (03/31/2023)  Depression (PHQ2-9): Low Risk (04/05/2024)  Financial Resource Strain: Low Risk (03/31/2023)  Physical Activity: Sufficiently Active (03/31/2023)  Social Connections: Unknown  (09/24/2024)  Stress: No Stress Concern Present (03/31/2023)  Tobacco Use: Medium Risk (09/23/2024)  Health Literacy: Adequate Health Literacy (03/31/2023)    Readmission Risk Interventions    09/12/2024   10:13 AM 08/05/2024   11:56 AM 11/19/2023    1:00 PM  Readmission Risk Prevention Plan  Transportation Screening Complete Complete Complete  PCP or Specialist Appt within 3-5 Days   Complete  HRI or Home Care Consult   Complete  Social Work Consult for Recovery Care Planning/Counseling   Complete  Palliative Care Screening   Not Applicable  Medication Review Oceanographer) Complete Complete Complete  PCP or Specialist appointment within 3-5 days of discharge Complete Complete   HRI or Home Care Consult Complete Complete   SW Recovery Care/Counseling Consult Complete Complete   Palliative Care Screening Not Applicable Not Applicable   Skilled Nursing Facility Complete Not Applicable

## 2024-09-29 ENCOUNTER — Ambulatory Visit: Admitting: Orthopedic Surgery

## 2024-09-29 ENCOUNTER — Ambulatory Visit

## 2024-09-29 LAB — CBC
HCT: 31.3 % — ABNORMAL LOW (ref 39.0–52.0)
Hemoglobin: 10 g/dL — ABNORMAL LOW (ref 13.0–17.0)
MCH: 27.4 pg (ref 26.0–34.0)
MCHC: 31.9 g/dL (ref 30.0–36.0)
MCV: 85.8 fL (ref 80.0–100.0)
Platelets: 304 10*3/uL (ref 150–400)
RBC: 3.65 MIL/uL — ABNORMAL LOW (ref 4.22–5.81)
RDW: 19 % — ABNORMAL HIGH (ref 11.5–15.5)
WBC: 12.7 10*3/uL — ABNORMAL HIGH (ref 4.0–10.5)
nRBC: 0 % (ref 0.0–0.2)

## 2024-09-29 LAB — BASIC METABOLIC PANEL WITH GFR
Anion gap: 13 (ref 5–15)
Anion gap: 11 (ref 5–15)
Anion gap: 10 (ref 5–15)
BUN: 16 mg/dL (ref 8–23)
BUN: 16 mg/dL (ref 8–23)
BUN: 16 mg/dL (ref 8–23)
CO2: 30 mmol/L (ref 22–32)
CO2: 32 mmol/L (ref 22–32)
CO2: 31 mmol/L (ref 22–32)
Calcium: 8.1 mg/dL — ABNORMAL LOW (ref 8.9–10.3)
Calcium: 8.2 mg/dL — ABNORMAL LOW (ref 8.9–10.3)
Calcium: 8.1 mg/dL — ABNORMAL LOW (ref 8.9–10.3)
Chloride: 92 mmol/L — ABNORMAL LOW (ref 98–111)
Chloride: 92 mmol/L — ABNORMAL LOW (ref 98–111)
Chloride: 91 mmol/L — ABNORMAL LOW (ref 98–111)
Creatinine, Ser: 1.48 mg/dL — ABNORMAL HIGH (ref 0.61–1.24)
Creatinine, Ser: 1.57 mg/dL — ABNORMAL HIGH (ref 0.61–1.24)
Creatinine, Ser: 1.55 mg/dL — ABNORMAL HIGH (ref 0.61–1.24)
GFR, Estimated: 45 mL/min — ABNORMAL LOW
GFR, Estimated: 42 mL/min — ABNORMAL LOW
GFR, Estimated: 43 mL/min — ABNORMAL LOW
Glucose, Bld: 212 mg/dL — ABNORMAL HIGH (ref 70–99)
Glucose, Bld: 153 mg/dL — ABNORMAL HIGH (ref 70–99)
Glucose, Bld: 131 mg/dL — ABNORMAL HIGH (ref 70–99)
Potassium: 2.7 mmol/L — CL (ref 3.5–5.1)
Potassium: 2.9 mmol/L — ABNORMAL LOW (ref 3.5–5.1)
Potassium: 3.1 mmol/L — ABNORMAL LOW (ref 3.5–5.1)
Sodium: 135 mmol/L (ref 135–145)
Sodium: 134 mmol/L — ABNORMAL LOW (ref 135–145)
Sodium: 133 mmol/L — ABNORMAL LOW (ref 135–145)

## 2024-09-29 LAB — COOXEMETRY PANEL
Carboxyhemoglobin: 1.9 % — ABNORMAL HIGH (ref 0.5–1.5)
Methemoglobin: <0.7 % (ref 0.0–1.5)
O2 Saturation: 76.9 %
Total hemoglobin: 10.3 g/dL — ABNORMAL LOW (ref 12.0–16.0)

## 2024-09-29 LAB — GLUCOSE, CAPILLARY
Glucose-Capillary: 147 mg/dL — ABNORMAL HIGH (ref 70–99)
Glucose-Capillary: 194 mg/dL — ABNORMAL HIGH (ref 70–99)
Glucose-Capillary: 160 mg/dL — ABNORMAL HIGH (ref 70–99)
Glucose-Capillary: 158 mg/dL — ABNORMAL HIGH (ref 70–99)

## 2024-09-29 LAB — MAGNESIUM: Magnesium: 1.9 mg/dL (ref 1.7–2.4)

## 2024-09-29 MED ORDER — POTASSIUM CHLORIDE 10 MEQ/100ML IV SOLN
10.0000 meq | INTRAVENOUS | Status: AC
  Administered 2024-09-30 (×4): 10 meq via INTRAVENOUS
  Filled 2024-09-29 (×4): qty 100

## 2024-09-29 MED ORDER — POTASSIUM CHLORIDE CRYS ER 20 MEQ PO TBCR
40.0000 meq | EXTENDED_RELEASE_TABLET | Freq: Once | ORAL | Status: AC
  Administered 2024-09-29: 40 meq via ORAL
  Filled 2024-09-29: qty 2

## 2024-09-29 MED ORDER — POTASSIUM CHLORIDE CRYS ER 20 MEQ PO TBCR
20.0000 meq | EXTENDED_RELEASE_TABLET | Freq: Once | ORAL | Status: AC
  Administered 2024-09-30: 20 meq via ORAL
  Filled 2024-09-29: qty 1

## 2024-09-29 MED ORDER — POTASSIUM CHLORIDE CRYS ER 20 MEQ PO TBCR
20.0000 meq | EXTENDED_RELEASE_TABLET | Freq: Once | ORAL | Status: AC
  Administered 2024-09-29: 20 meq via ORAL
  Filled 2024-09-29: qty 1

## 2024-09-29 MED ORDER — POTASSIUM CHLORIDE 10 MEQ/100ML IV SOLN
10.0000 meq | INTRAVENOUS | Status: AC
  Administered 2024-09-29 (×3): 10 meq via INTRAVENOUS
  Filled 2024-09-29 (×3): qty 100

## 2024-09-29 MED ORDER — POTASSIUM CHLORIDE 10 MEQ/50ML IV SOLN
10.0000 meq | INTRAVENOUS | Status: DC
  Filled 2024-09-29 (×4): qty 50

## 2024-09-29 MED ORDER — MAGNESIUM SULFATE 2 GM/50ML IV SOLN
2.0000 g | Freq: Once | INTRAVENOUS | Status: AC
  Administered 2024-09-29: 2 g via INTRAVENOUS
  Filled 2024-09-29: qty 50

## 2024-09-29 MED ORDER — ALUM & MAG HYDROXIDE-SIMETH 200-200-20 MG/5ML PO SUSP
30.0000 mL | ORAL | Status: AC | PRN
  Administered 2024-09-29 – 2024-09-30 (×2): 30 mL via ORAL
  Filled 2024-09-29 (×2): qty 30

## 2024-09-29 MED ORDER — POTASSIUM CHLORIDE CRYS ER 20 MEQ PO TBCR
40.0000 meq | EXTENDED_RELEASE_TABLET | ORAL | Status: AC
  Administered 2024-09-29 (×2): 40 meq via ORAL
  Filled 2024-09-29 (×2): qty 2

## 2024-09-29 MED ORDER — FUROSEMIDE 10 MG/ML IJ SOLN
15.0000 mg/h | INTRAVENOUS | Status: DC
  Administered 2024-09-29 – 2024-09-30 (×3): 15 mg/h via INTRAVENOUS
  Filled 2024-09-29: qty 20
  Filled 2024-09-29: qty 200

## 2024-09-29 MED ORDER — POTASSIUM CHLORIDE 10 MEQ/100ML IV SOLN
10.0000 meq | INTRAVENOUS | Status: AC
  Administered 2024-09-29 (×4): 10 meq via INTRAVENOUS
  Filled 2024-09-29 (×4): qty 100

## 2024-09-29 NOTE — Plan of Care (Signed)
" °  Problem: Education: Goal: Ability to describe self-care measures that may prevent or decrease complications (Diabetes Survival Skills Education) will improve 09/29/2024 1114 by Elaine Verdel SAILOR, RN Outcome: Progressing 09/29/2024 1015 by Elaine Verdel SAILOR, RN Outcome: Progressing   Problem: Coping: Goal: Ability to adjust to condition or change in health will improve 09/29/2024 1114 by Elaine Verdel SAILOR, RN Outcome: Progressing 09/29/2024 1015 by Elaine Verdel SAILOR, RN Outcome: Progressing   Problem: Fluid Volume: Goal: Ability to maintain a balanced intake and output will improve 09/29/2024 1114 by Elaine Verdel SAILOR, RN Outcome: Progressing 09/29/2024 1015 by Elaine Verdel SAILOR, RN Outcome: Progressing   Problem: Tissue Perfusion: Goal: Adequacy of tissue perfusion will improve 09/29/2024 1114 by Elaine Verdel SAILOR, RN Outcome: Progressing 09/29/2024 1015 by Elaine Verdel SAILOR, RN Outcome: Progressing   Problem: Skin Integrity: Goal: Risk for impaired skin integrity will decrease 09/29/2024 1114 by Elaine Verdel SAILOR, RN Outcome: Progressing 09/29/2024 1015 by Elaine Verdel SAILOR, RN Outcome: Progressing   "

## 2024-09-29 NOTE — Progress Notes (Signed)
 Physical Therapy Treatment Patient Details Name: Justin [name] MRN: [id number] DOB: 08/03/1934 Today's Date: 09/29/2024   History of Present Illness 89 y.o. male admitted 09/23/24 with recurrent falls, volume overload. Workup for acute on chronic CHF, afib with RVR. PMH includes CHF, afib, s/p ICD, s/p TAVR, CAD s/p CABG, CKD, CKD, orthostatic hypotension.    PT Comments  Currently, pt is mod A for bed mobility needing tactile cues at the hips to get to EOB. Pt is max A for transfers and pt experiences increased difficulty to fully stand needing tactile and verbal cues for upright posture. Pt is unable to ambulate more than 58ft before pt begins to experience loss of balance further complicated due to R knee pain needing mod A x2 w/ RW for support to recover. Pt is still limited by activity tolerance, fatigue, endurance, mobility, and balance. Pt would likely benefit from inpatient rehab <3hrs/day to d/c home. Will continue to follow acutely.    If plan is discharge home, recommend the following: A lot of help with walking and/or transfers;Assistance with cooking/housework;A lot of help with bathing/dressing/bathroom;Assist for transportation;Direct supervision/assist for medications management;Direct supervision/assist for financial management;Help with stairs or ramp for entrance   Can travel by private vehicle     No  Equipment Recommendations  Wheelchair (measurements PT);Wheelchair cushion (measurements PT);Rolling walker (2 wheels);BSC/3in1    Recommendations for Other Services       Precautions / Restrictions Precautions Precautions: Fall;Other (comment) Recall of Precautions/Restrictions: Intact Precaution/Restrictions Comments: h/o orthostatic hypotension, frequent falls Required Braces or Orthoses: Other Brace Other Brace: Unna boot Restrictions Weight Bearing Restrictions Per Provider Order: No     Mobility  Bed Mobility Overal bed mobility: Needs Assistance Bed  Mobility: Supine to Sit     Supine to sit: Mod assist, HOB elevated, Used rails     General bed mobility comments: Pt was mod A at the trunk to scoot hips toward EOB. Pt required tactile cues for hand placement and pt demonstrating posterior leaning.    Transfers Overall transfer level: Needs assistance Equipment used: Rolling walker (2 wheels) Transfers: Sit to/from Stand, Bed to chair/wheelchair/BSC Sit to Stand: Max assist   Step pivot transfers: Mod assist, +2 physical assistance       General transfer comment: Pt was max assist for sit to stand requiring tactile cues for hand placement and had increased difficulty to come to a full stand needing verbal cues to stand upright. Pt was mod A +2 for a step pivot from bed to chair needing extensive tactile cues at the trunk to tuck the buttock in and tactile cues to advance the walker.    Ambulation/Gait Ambulation/Gait assistance: Mod assist, +2 physical assistance Gait Distance (Feet): 8 Feet (2 bouts of 65ft each bout) Assistive device: Rolling walker (2 wheels) Gait Pattern/deviations: Trunk flexed, Leaning posteriorly, Decreased stride length, Step-to pattern, Decreased step length - right, Decreased step length - left, Decreased weight shift to right, Decreased weight shift to left, Knee flexed in stance - right, Knee flexed in stance - left, Knees buckling, Narrow base of support Gait velocity: decreased Gait velocity interpretation: <1.8 ft/sec, indicate of risk for recurrent falls   General Gait Details: Pt was unable to ambulate further due to inability to maintain an upright posture and excessive R knee flexion when ambulating. Pt displayed a narrow BOS and pt needed cues for safety with the RW. Pt had a difficult time with maintaning a step to pattern and began to cross feet in front  of the other needing cues for safety.   Stairs             Wheelchair Mobility     Tilt Bed    Modified Rankin (Stroke Patients  Only)       Balance Overall balance assessment: Needs assistance Sitting-balance support: Bilateral upper extremity supported, Feet supported Sitting balance-Leahy Scale: Poor Sitting balance - Comments: Pt was posterior leaning intially and would at times lean back needing CGA at the back for support. Pt was able to lean back to move cord underneath leg but needed bilateral UE support using the handrail and CGA for support. Postural control: Posterior lean Standing balance support: Bilateral upper extremity supported, During functional activity, Reliant on assistive device for balance Standing balance-Leahy Scale: Poor Standing balance comment: Requires increased time and reliant on RW.                            Communication Communication Communication: No apparent difficulties  Cognition Arousal: Alert Behavior During Therapy: WFL for tasks assessed/performed, Impulsive   PT - Cognitive impairments: Sequencing, Problem solving, Safety/Judgement, Awareness                       PT - Cognition Comments: Pt movitated to participate but needing extensive cueing for sequencing, problem solving, and decreased awareness of safety and sequencing. Following commands: Impaired Following commands impaired: Follows one step commands inconsistently    Cueing Cueing Techniques: Verbal cues, Gestural cues, Tactile cues  Exercises      General Comments General comments (skin integrity, edema, etc.): VSS on RA. Supine BP: 123/61, Sitting: 114/71, sitting after transfer to chair: 96/53. Sitting for 3 minutes: 120/56.      Pertinent Vitals/Pain Pain Assessment Pain Assessment: Faces Faces Pain Scale: Hurts little more Pain Location: R knee and L hip Pain Descriptors / Indicators: Cramping, Discomfort, Tiring Pain Intervention(s): Limited activity within patient's tolerance, Monitored during session    Home Living                          Prior Function             PT Goals (current goals can now be found in the care plan section) Acute Rehab PT Goals Patient Stated Goal: to go live with wife PT Goal Formulation: With patient Time For Goal Achievement: 10/08/24 Potential to Achieve Goals: Good Progress towards PT goals: Progressing toward goals    Frequency    Min 2X/week      PT Plan      Co-evaluation              AM-PAC PT 6 Clicks Mobility   Outcome Measure  Help needed turning from your back to your side while in a flat bed without using bedrails?: A Lot Help needed moving from lying on your back to sitting on the side of a flat bed without using bedrails?: A Lot Help needed moving to and from a bed to a chair (including a wheelchair)?: Total Help needed standing up from a chair using your arms (e.g., wheelchair or bedside chair)?: A Lot Help needed to walk in hospital room?: Total Help needed climbing 3-5 steps with a railing? : Total 6 Click Score: 9    End of Session Equipment Utilized During Treatment: Gait belt Activity Tolerance: Patient tolerated treatment well;Patient limited by fatigue;Patient limited by pain Patient left: in  chair;with call bell/phone within reach;with chair alarm set Nurse Communication: Mobility status;Other (comment);Need for lift equipment (Pt needed to use BSC in an hour) PT Visit Diagnosis: Unsteadiness on feet (R26.81);Other abnormalities of gait and mobility (R26.89);Muscle weakness (generalized) (M62.81);Difficulty in walking, not elsewhere classified (R26.2);History of falling (Z91.81);Pain Pain - Right/Left: Right (L hip) Pain - part of body: Knee;Hip     Time: 9069-8982 PT Time Calculation (min) (ACUTE ONLY): 47 min  Charges:    $Gait Training: 8-22 mins $Therapeutic Activity: 23-37 mins PT General Charges $$ ACUTE PT VISIT: 1 Visit                     Murtis CHRISTELLA Ferries, SPT    Ibeth Fahmy 09/29/2024, 2:18 PM

## 2024-09-29 NOTE — Progress Notes (Signed)
 "                                                                                                                                                                                                         Palliative Medicine Progress Note   Patient Name: Justin [name]       Date: 09/29/2024 DOB: 02-13-1934  Age: 89 y.o. MRN#: [id number] Attending Physician: Fairy Frames, MD Primary Care Physician: Mast, Man X, NP Admit Date: 09/23/2024   HPI/Patient Profile: 89 y.o. male  with past medical history of chronic HFrEF, permanent atrial fibrillation, aortic stenosis s/p TAVR in 2018, Medtronic BiV ICD, CKD 3, and CAD s/p CABG in 2007.  He presented to the ED on 09/23/2024 with weakness, recurrent falls, and orthostatic symptoms.  He is admitted with acute on chronic HFrEF.   Palliative Medicine has been consulted for goals of care discussions and complex medical decision making.   Subjective: Chart reviewed. Per HF progress note from today, patient is volume overloaded with CVP 14.    Objective:   amiodarone  60 mg/hr (09/29/24 0542)   furosemide  (LASIX ) 200 mg in dextrose  5 % 100 mL (2 mg/mL) infusion 15 mg/hr (09/29/24 9077)   magnesium  sulfate bolus IVPB 2 g (09/29/24 1025)   milrinone  0.25 mcg/kg/min (09/29/24 0918)     Physical Exam          Vital Signs: BP (!) 120/56   Pulse (!) 118   Temp 97.7 F (36.5 C) (Oral)   Resp 18   Ht 5' 6 (1.676 m)   Wt 121.7 kg   SpO2 91%   BMI 43.30 kg/m  SpO2: SpO2: 91 % O2 Device: O2 Device: Room Air O2 Flow Rate: O2 Flow Rate (L/min): 2 L/min  Intake/output summary:  Intake/Output Summary (Last 24 hours) at 09/29/2024 1115 Last data filed at 09/29/2024 0900 Gross per 24 hour  Intake 600 ml  Output 2650 ml  Net -2050 ml    LBM: Last BM Date : 09/28/24     Palliative Assessment/Data: ***     Palliative Medicine Assessment & Plan   Assessment: Principal Problem:   Acute on chronic combined systolic and diastolic CHF  (congestive heart failure) (HCC) Active Problems:   Hyperlipidemia   Essential hypertension   CAD, AUTOLOGOUS BYPASS GRAFT   BPH with urinary obstruction   Hypothyroidism   History of transcatheter aortic valve replacement (TAVR)   Vertigo   Atypical atrial flutter (HCC)   Biventricular implantable cardioverter-defibrillator (ICD) in situ   NSVT (nonsustained ventricular tachycardia) (HCC)  Chronic kidney disease, stage 3a (HCC)   Type 2 diabetes mellitus (HCC)   COPD (chronic obstructive pulmonary disease) (HCC)   Hypokalemia   Prolonged QT interval   Aspiration pneumonia (HCC)    Recommendations/Plan: ***  Medical Decision Making: Primary decision maker - patient Daughter/Justin [name] is HCPOA but includes her sister in all major decisions   Code Status/Advance Care Planning: DNR - Limited  Prognosis:  {Palliative Care Prognosis:23504}  Discharge Planning: {Palliative dispostion:23505}  Care plan was discussed with ***  Thank you for allowing the Palliative Medicine Team to assist in the care of this patient.   ***   Recardo KATHEE Loll, NP   Please contact Palliative Medicine Team phone at [phone] for questions and concerns.  For individual providers, please see AMION.      "

## 2024-09-29 NOTE — Progress Notes (Addendum)
 "    Advanced Heart Failure Rounding Note  Cardiologist: [name], MD  [facility] Cardiologist: Dr. Zenaida  Chief Complaint: a/c systolic heart failure  Patient Profile   Justin [name] is a 89 y.o. male with chronic systolic heart failure, CAD s/p CABG, permanent afib and AS s/p TAVR. Patient with a third recurrent admission in the last two months. Initially with overdiuresis and inability to tolerate GDMT, prior with urosepsis, now with acute on chronic systolic heart failure and afib w/ RVR. Also w/ recurrent falls. NYHA class IV on arrival w/ elevated LA.    Significant events:   1/30: Echo EF 35-40%, GIIIDD, RV severely reduced, stable TAVR prosthesis   Subjective:    Remains on Milrinone  0.25 mcg/kg/min. Co-ox 77% 2.7L in UOP yesterday; CVP 12-14   Scr stable at 1.5 K 2.7, Mg 1.9. Lasix  gtt paused as of 7 am   No dyspnea, main complaint is poor sleep and vertigo.   Objective:    Weight Range: 121.7 kg Body mass index is 43.3 kg/m.   Vital Signs:   Temp:  [97.5 F (36.4 C)-98.3 F (36.8 C)] 97.9 F (36.6 C) (02/04 0300) Pulse Rate:  [98-114] 109 (02/03 2300) Resp:  [17-20] 20 (02/04 0300) BP: (99-120)/(55-75) 99/61 (02/04 0300) SpO2:  [90 %-99 %] 90 % (02/04 0300) Weight:  [121.7 kg] 121.7 kg (02/04 0300) Last BM Date : 09/28/24  Weight change: Filed Weights   09/27/24 0312 09/28/24 0324 09/29/24 0300  Weight: 126.5 kg 112.9 kg 121.7 kg   Intake/Output:  Intake/Output Summary (Last 24 hours) at 09/29/2024 0817 Last data filed at 09/29/2024 0300 Gross per 24 hour  Intake 600 ml  Output 2650 ml  Net -2050 ml    Physical Exam  CVP 12-14  GENERAL: elderly, fatigued appearing male, NAD Lungs- diminished at bases  CARDIAC:  JVP 12 cm          Irregularly irregular rate and rhythm. No murmur. 1+ b/l edema to knees/thighs ABDOMEN: Soft, non-tender, non-distended.  EXTREMITIES: Warm and well perfused.  NEUROLOGIC: No obvious FND  Telemetry   Afib  110s (personally reviewed)  Labs   CBC Recent Labs    09/29/24 0528  WBC 12.7*  HGB 10.0*  HCT 31.3*  MCV 85.8  PLT 304    Basic Metabolic Panel Recent Labs    97/96/73 0440 09/28/24 1630 09/29/24 0528  NA 137 135 135  K 3.4* 3.2* 2.7*  CL 96* 93* 92*  CO2 31 31 30   GLUCOSE 140* 221* 212*  BUN 16 17 16   CREATININE 1.51* 1.53* 1.48*  CALCIUM  8.4* 8.3* 8.1*  MG 2.1  --  1.9   BNP (last 3 results) Recent Labs    07/15/24 1227 07/21/24 1615 08/03/24 1200  BNP 653.6* 716.3* 674.3*   ProBNP (last 3 results) Recent Labs    08/30/24 1150 09/07/24 1655 09/23/24 1418  PROBNP 4,372* 2,178.0* 3,327.0*   Medications:    Scheduled Medications:  allopurinol   100 mg Oral Daily   atorvastatin   20 mg Oral Daily   Chlorhexidine  Gluconate Cloth  6 each Topical Daily   dabigatran   150 mg Oral BID   docusate sodium   100 mg Oral BID   ferrous sulfate   325 mg Oral Q breakfast   insulin  aspart  0-9 Units Subcutaneous TID WC   levothyroxine   175 mcg Oral QAC breakfast   montelukast   10 mg Oral QHS   pantoprazole   40 mg Oral BID   sodium chloride   flush  10-40 mL Intracatheter Q12H   sodium chloride  flush  3 mL Intravenous Q12H   spironolactone   50 mg Oral Daily   tamsulosin   0.4 mg Oral QHS   venlafaxine  XR  37.5 mg Oral Daily    Infusions:  amiodarone  60 mg/hr (09/29/24 0542)   magnesium  sulfate bolus IVPB     milrinone  0.25 mcg/kg/min (09/28/24 2156)   potassium chloride  10 mEq (09/29/24 0815)    PRN Medications: acetaminophen  **OR** acetaminophen , albuterol , bisacodyl , HYDROcodone -acetaminophen , polyethylene glycol, sodium chloride  flush, sodium chloride  flush  Assessment/Plan   1. End-stage, Acute on chronic systolic CHF: Medtronic CRT-D.  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  Echo this admission with EF 35-40%, severe RV dysfunction, TAVR valve stable. Suspect end stage biventricular failure, now requiring  milrinone  to aid diuresis. Pt and family now open to home hospice support once diuresed.   - remains milrinone  0.25. Co-ox 77%. Remains volume up on exam. CVP 12-14  - restart lasix  gtt at 15/hr and give aggressive K supp. Repeat BMP this afternoon. If K stable, will give dose of Diamox to augment diuresis  - continue milrinone  until better diuresed, then will begin wean  - Continue Spironolactone  50 mg daily (watch SCr)  - Off ARB/ARNI for now with soft BP.  - No Farxiga  with recent UTI/urosepsis.  - Continue unna boots  - Consult to palliaitve care for GOC. Family would want home hospice  2. Atrial fibrillation: Permanent. HR elevated to 120-130s at times.  - Continue amio gtt for rate control while on milrinone . - Continue Pradaxa .  3. CAD: CABG 2007. Denies CP   - Continue statin.  - No ASA with Pradaxa  use.  4. PVCs: Frequent. - Amiodarone  gtt for now.  - give K and Mg supp  5. AS s/p TAVR: Valve stable on echo this admission.  6. Hypokalemia: K 2.7 - supp w/ IV KCl - continue spiro 50 mg daily   Length of Stay: [address] PA-C  09/29/2024, 8:17 AM  Advanced Heart Failure Team Pager [phone] (M-F; 7a - 5p)   Please visit [email or web address]: For overnight coverage please call cardiology fellow first. If fellow not available call Shock/ECMO MD on call.  For ECMO / Mechanical Support (Impella, IABP, LVAD) issues call Shock / ECMO MD on call.    Patient seen with PA, I formulated the plan and agree with the above note.   I/Os net negative 2L with weight trending down.  Creatinine 1.52 => 1.48. K low at 2.7.  Co-ox stable 77% on milrinone  0.25.  CVP 14 on my read today.   HR continues to be elevated in AF on amiodarone  gtt for rate control.   Occasional wheezing, otherwise stable at rest.   General: NAD Neck: JVP 12-14, no thyromegaly or thyroid  nodule.  Lungs: Clear to auscultation bilaterally with normal respiratory effort. CV: Nondisplaced PMI.  Heart tachy, irregular  S1/S2, no S3/S4, no murmur.  No peripheral edema.   Abdomen: Soft, nontender, no hepatosplenomegaly, no distention.  Skin: Intact without lesions or rashes.  Neurologic: Alert and oriented x 3.  Psych: Normal affect. Extremities: No clubbing or cyanosis.  HEENT: Normal.   Suspect end stage biventricular failure, now requiring milrinone  to aid diuresis. Co-ox 77% today. He is still volume overloaded with CVP 14.  Good diuresis yesterday, creatinine stable at 1.48.  - Continue milrinone  0.25 mcg/kg/min, wean when diuresis is completed.  - Would keep Lasix  gtt 15 mg/hr today and will  give acetazolamide 500 mg IV x 1 this afternoon after K is repleted (check early afternoon BMET).  - Continue spironolactone .    Patient and daughter are interested in home with hospice once he is diuresed and ready for home.  Will consult palliative care service.   Need to get out of bed, work with PT.   Ezra Shuck 09/29/2024 9:09 AM    "

## 2024-09-29 NOTE — Plan of Care (Signed)
" °  Problem: Education: Goal: Ability to describe self-care measures that may prevent or decrease complications (Diabetes Survival Skills Education) will improve Outcome: Progressing   Problem: Coping: Goal: Ability to adjust to condition or change in health will improve Outcome: Progressing   Problem: Fluid Volume: Goal: Ability to maintain a balanced intake and output will improve Outcome: Progressing   Problem: Metabolic: Goal: Ability to maintain appropriate glucose levels will improve Outcome: Progressing   Problem: Skin Integrity: Goal: Risk for impaired skin integrity will decrease Outcome: Progressing   Problem: Activity: Goal: Risk for activity intolerance will decrease Outcome: Progressing   Problem: Clinical Measurements: Goal: Cardiovascular complication will be avoided Outcome: Progressing   "

## 2024-09-29 NOTE — Progress Notes (Signed)
 " PROGRESS NOTE    Justin [name]  FMW:[id number] DOB: Dec 28, 1933 DOA: 09/23/2024 PCP: Mast, Man X, NP  89/M with chronic systolic CHF with RV failure, permanent atrial fibrillation, TAVR, Medtronic ICD, CAD/CABG, CKD 3, recently admitted with ESBL E. coli urosepsis, AKI CKD 3, GDMT was discontinued, seen in heart failure clinic 1/26 noted to have fluid retention, restarted on torsemide . - Back in the ED 1/29 with recurrent falls, volume overload by OptiVol, labs noted creatinine of 1.3, proBNP 3300, troponin 43, lactate 2.4, CT head with chronic disease.  Patient reports 2 of his falls were where he slid down from a chair and last episode yesterday was a true fall, history of orthostatic symptoms as well - Admitted, started on diuretics, cards following - Palliative care consulted - 2/1, lactate trending up - 2/2 started on milrinone  and Lasix  gtt.  Subjective: - Feels better overall, breathing is improving, reports increased cough after meals  Assessment and Plan:  Acute on chronic systolic CHF - Last echo 3/25 with EF 40-45%, moderately reduced RV - Repeat echo this admission with EF 35-40%, grade 3 DD, restrictive, severely reduced RV - Recently management complicated by AKI, orthostasis etc. - Followed by CHF team,  concerns that he may be nearing end-stage, he is DNR, palliative consulted - Cards following,, lactate remains elevated, beta-blocker on hold, started Lasix  and milrinone  2/2 - This is his third hospitalization in 1 to 2 months, palliative consulted and following - Discussed with daughter yesterday, she is considering getting hospice involved at discharge when optimized medically  Frequent falls - Does not have any localizing signs, CT head, C-spine with chronic disease - Has some orthostatic symptoms as well, B12 is normal, orthostatics negative -Likely related to right heart failure, A-fib RVR and general debility - PT OT eval> may need SNF - Suspect his falls are  multifactorial,   Permanent A-fib -With RVR - Beta-blocker on hold now, continue amiodarone , Pradaxa  - Resume beta-blocker when volume status is better - Now on  Amio gtt.  CAD/CABG - Continue atorvastatin , Pradaxa   Hypokalemia Repleted  TAVR  Hypothyroidism  COPD - Stable continue home nebs  Type 2 diabetes mellitus - Recent A1c was 6.2, SSI for now  CKD 3A - Stable, monitor, recent admission with AKI  DVT prophylaxis: Pradaxa  Code Status: DNR Family Communication: No family at bedside, updated daughter Sherrilyn yesterday Disposition Plan: Need SNF  Consultants:    Procedures:   Antimicrobials:    Objective: Vitals:   09/29/24 0824 09/29/24 0900 09/29/24 1000 09/29/24 1018  BP:  109/62  (!) 120/56  Pulse:  (!) 118    Resp: 20 (!) 23 20 18   Temp:  97.7 F (36.5 C)    TempSrc:  Oral    SpO2:  91%    Weight:      Height:        Intake/Output Summary (Last 24 hours) at 09/29/2024 1113 Last data filed at 09/29/2024 0900 Gross per 24 hour  Intake 600 ml  Output 2650 ml  Net -2050 ml   Filed Weights   09/27/24 0312 09/28/24 0324 09/29/24 0300  Weight: 126.5 kg 112.9 kg 121.7 kg    Examination:  General exam: Early frail chronically ill sitting up in bed, AO x 3, no distress Respiratory system: Improving air movement, decreased at the bases Cardiovascular system: S1 & S2 heard, irregular Abd: nondistended, soft and nontender.Normal bowel sounds heard. Central nervous system: Alert and oriented. No focal neurological deficits. Extremities: 1+ edema  Skin: No rashes Psychiatry:  Mood & affect appropriate.     Data Reviewed:   CBC: Recent Labs  Lab 09/23/24 1418 09/23/24 1430 09/24/24 0405 09/25/24 0238 09/29/24 0528  WBC 10.3  --  10.2 9.6 12.7*  NEUTROABS 6.7  --   --   --   --   HGB 11.0* 11.9*  11.9* 10.9* 10.4* 10.0*  HCT 35.1* 35.0*  35.0* 35.3* 33.3* 31.3*  MCV 86.0  --  87.8 86.9 85.8  PLT 312  --  297 281 304   Basic Metabolic  Panel: Recent Labs  Lab 09/23/24 1418 09/23/24 1430 09/24/24 0405 09/25/24 0238 09/26/24 0139 09/26/24 0140 09/27/24 0117 09/27/24 1500 09/28/24 0440 09/28/24 1630 09/29/24 0528  NA 141   < > 141   < >  --    < > 136 136 137 135 135  K 3.4*   < > 3.3*   < >  --    < > 3.3* 3.2* 3.4* 3.2* 2.7*  CL 99   < > 100   < >  --    < > 96* 97* 96* 93* 92*  CO2 31  --  29   < >  --    < > 27 29 31 31 30   GLUCOSE 102*   < > 106*   < >  --    < > 114* 134* 140* 221* 212*  BUN 20   < > 18   < >  --    < > 19 17 16 17 16   CREATININE 1.33*   < > 1.26*   < >  --    < > 1.45* 1.43* 1.51* 1.53* 1.48*  CALCIUM  8.4*  --  8.3*   < >  --    < > 8.0* 7.8* 8.4* 8.3* 8.1*  MG 1.9  --  1.8  --  2.0  --   --   --  2.1  --  1.9  PHOS  --   --  3.4  --   --   --   --   --   --   --   --    < > = values in this interval not displayed.   GFR: Estimated Creatinine Clearance: 42.5 mL/min (A) (by C-G formula based on SCr of 1.48 mg/dL (H)). Liver Function Tests: Recent Labs  Lab 09/23/24 1418 09/24/24 0405  AST 40 30  ALT 22 21  ALKPHOS 180* 132*  BILITOT 0.7 0.8  PROT 6.6 6.2*  ALBUMIN  2.9* 2.5*   No results for input(s): LIPASE, AMYLASE in the last 168 hours. No results for input(s): AMMONIA in the last 168 hours. Coagulation Profile: No results for input(s): INR, PROTIME in the last 168 hours. Cardiac Enzymes: No results for input(s): CKTOTAL, CKMB, CKMBINDEX, TROPONINI in the last 168 hours. BNP (last 3 results) Recent Labs    08/30/24 1150 09/07/24 1655 09/23/24 1418  PROBNP 4,372* 2,178.0* 3,327.0*   HbA1C: No results for input(s): HGBA1C in the last 72 hours. CBG: Recent Labs  Lab 09/28/24 0610 09/28/24 1112 09/28/24 1610 09/29/24 0626 09/29/24 1052  GLUCAP 136* 139* 179* 147* 194*   Lipid Profile: No results for input(s): CHOL, HDL, LDLCALC, TRIG, CHOLHDL, LDLDIRECT in the last 72 hours. Thyroid  Function Tests: No results for input(s): TSH,  T4TOTAL, FREET4, T3FREE, THYROIDAB in the last 72 hours.  Anemia Panel: No results for input(s): VITAMINB12, FOLATE, FERRITIN, TIBC, IRON, RETICCTPCT in the last 72 hours.  Urine  analysis:    Component Value Date/Time   COLORURINE STRAW (A) 09/23/2024 1359   APPEARANCEUR CLEAR 09/23/2024 1359   LABSPEC 1.005 09/23/2024 1359   PHURINE 5.0 09/23/2024 1359   GLUCOSEU NEGATIVE 09/23/2024 1359   GLUCOSEU NEGATIVE 05/07/2023 1415   HGBUR NEGATIVE 09/23/2024 1359   HGBUR negative 09/05/2010 0810   BILIRUBINUR NEGATIVE 09/23/2024 1359   BILIRUBINUR NEG 03/20/2022 1121   KETONESUR NEGATIVE 09/23/2024 1359   PROTEINUR NEGATIVE 09/23/2024 1359   UROBILINOGEN 0.2 05/07/2023 1415   NITRITE NEGATIVE 09/23/2024 1359   LEUKOCYTESUR NEGATIVE 09/23/2024 1359   Sepsis Labs: @LABRCNTIP (procalcitonin:4,lacticidven:4)  ) Recent Results (from the past 240 hours)  Culture, blood (routine x 2)     Status: None   Collection Time: 09/23/24  2:10 PM   Specimen: BLOOD LEFT ARM  Result Value Ref Range Status   Specimen Description BLOOD LEFT ARM  Final   Special Requests   Final    BOTTLES DRAWN AEROBIC AND ANAEROBIC Blood Culture adequate volume   Culture   Final    NO GROWTH 5 DAYS Performed at [facility], [address] [place], [place] [address]    Report Status 09/28/2024 FINAL  Final  Resp panel by RT-PCR (RSV, Flu A&B, Covid) Anterior Nasal Swab     Status: None   Collection Time: 09/23/24 11:26 PM   Specimen: Anterior Nasal Swab  Result Value Ref Range Status   SARS Coronavirus 2 by RT PCR NEGATIVE NEGATIVE Final   Influenza A by PCR NEGATIVE NEGATIVE Final   Influenza B by PCR NEGATIVE NEGATIVE Final    Comment: (NOTE) The Xpert Xpress SARS-CoV-2/FLU/RSV plus assay is intended as an aid in the diagnosis of influenza from Nasopharyngeal swab specimens and should not be used as a sole basis for treatment. Nasal washings and aspirates are unacceptable for  Xpert Xpress SARS-CoV-2/FLU/RSV testing.  Fact Sheet for Patients: bloggercourse.com  Fact Sheet for Healthcare Providers: seriousbroker.it  This test is not yet approved or cleared by the [place]  FDA and has been authorized for detection and/or diagnosis of SARS-CoV-2 by FDA under an Emergency Use Authorization (EUA). This EUA will remain in effect (meaning this test can be used) for the duration of the COVID-19 declaration under Section 564(b)(1) of the Act, 21 U.S.C. section 360bbb-3(b)(1), unless the authorization is terminated or revoked.     Resp Syncytial Virus by PCR NEGATIVE NEGATIVE Final    Comment: (NOTE) Fact Sheet for Patients: bloggercourse.com  Fact Sheet for Healthcare Providers: seriousbroker.it  This test is not yet approved or cleared by the [place]  FDA and has been authorized for detection and/or diagnosis of SARS-CoV-2 by FDA under an Emergency Use Authorization (EUA). This EUA will remain in effect (meaning this test can be used) for the duration of the COVID-19 declaration under Section 564(b)(1) of the Act, 21 U.S.C. section 360bbb-3(b)(1), unless the authorization is terminated or revoked.  Performed at [facility], [address] [place], [place] [address]   Culture, blood (routine x 2)     Status: None   Collection Time: 09/23/24 11:29 PM   Specimen: BLOOD LEFT HAND  Result Value Ref Range Status   Specimen Description BLOOD LEFT HAND  Final   Special Requests   Final    BOTTLES DRAWN AEROBIC ONLY Blood Culture adequate volume   Culture   Final    NO GROWTH 5 DAYS Performed at [facility], [address] [place], [place] [address]  Report Status 09/28/2024 FINAL  Final     Radiology Studies: DG CHEST PORT 1 VIEW Result Date: 09/28/2024 EXAM: 1 VIEW(S) XRAY OF THE CHEST 09/28/2024 06:54:00 PM COMPARISON:  09/27/2024 CLINICAL HISTORY: Hypoxia. FINDINGS: LINES, TUBES AND DEVICES: Right upper extremity PICC in place with tip at superior cavoatrial junction. LUNGS AND PLEURA: Bilateral increased interstitial markings. Trace bilateral pleural effusions. No pneumothorax. HEART AND MEDIASTINUM: Left chest AICD/pacemaker noted. Status post TAVR. Stable cardiomegaly. Intact sternotomy wires. BONES AND SOFT TISSUES: Intact sternotomy wires. No acute osseous abnormality. IMPRESSION: 1. Bilateral increased interstitial markings with trace bilateral pleural effusions. 2. Stable cardiomegaly. Electronically signed by: Greig Pique MD 09/28/2024 09:09 PM EST RP Workstation: [id number]   DG Chest Port 1 View Result Date: 09/27/2024 EXAM: 1 VIEW XRAY OF THE CHEST 09/27/2024 01:36:00 PM COMPARISON: 09/23/2024 CLINICAL HISTORY: PICC (peripherally inserted central catheter) in place. FINDINGS: LINES, TUBES AND DEVICES: Right PICC (peripherally inserted central catheter) with distal tip at superior cavoatrial junction. Left cardiac device stable in place. LUNGS AND PLEURA: Similar appearance of mild pulmonary vascular congestion. Left basilar opacity is favored to reflect atelectasis. No pleural effusion. No pneumothorax. HEART AND MEDIASTINUM: Stable cardiomegaly. Aortic atherosclerosis. Status post median sternotomy and TAVR (Transcatheter Aortic Valve Replacement). BONES AND SOFT TISSUES: Status post median sternotomy. IMPRESSION: 1. Right PICC in place with distal tip at the superior cavoatrial junction. 2. Mild pulmonary vascular congestion. 3. Left basilar opacity, favored to reflect atelectasis. Electronically signed by: Donnice Mania MD 09/27/2024 01:51 PM EST RP Workstation: [id number]      Scheduled Meds:  allopurinol   100 mg Oral Daily   atorvastatin   20 mg Oral Daily   Chlorhexidine  Gluconate Cloth  6 each Topical Daily   dabigatran   150 mg Oral BID   docusate sodium   100 mg Oral BID   ferrous sulfate   325 mg Oral  Q breakfast   insulin  aspart  0-9 Units Subcutaneous TID WC   levothyroxine   175 mcg Oral QAC breakfast   montelukast   10 mg Oral QHS   pantoprazole   40 mg Oral BID   sodium chloride  flush  10-40 mL Intracatheter Q12H   sodium chloride  flush  3 mL Intravenous Q12H   spironolactone   50 mg Oral Daily   tamsulosin   0.4 mg Oral QHS   venlafaxine  XR  37.5 mg Oral Daily   Continuous Infusions:  amiodarone  60 mg/hr (09/29/24 0542)   furosemide  (LASIX ) 200 mg in dextrose  5 % 100 mL (2 mg/mL) infusion 15 mg/hr (09/29/24 0922)   magnesium  sulfate bolus IVPB 2 g (09/29/24 1025)   milrinone  0.25 mcg/kg/min (09/29/24 0918)      LOS: 6 days    Time spent:    Sigurd Pac, MD [facility]   09/29/2024, 11:13 AM    "

## 2024-09-29 NOTE — TOC Progression Note (Signed)
 Transition of Care [facility]) - Progression Note    Patient Details  Name: Justin [name] MRN: [id number] Date of Birth: 05/01/1934  Transition [facility]) CM/SW Contact  Arlana JINNY Nicholaus [place] Phone Number: [phone] 09/29/2024, 4:28 PM  Clinical Narrative:   CSW started patients auth for 10/01/24, auth pending [id number].   HF CSW/Unit CM will continue to follow and monitor for dc readiness.     Expected Discharge Plan: Assisted Living ([facility]) Barriers to Discharge: Continued Medical Work up               Expected Discharge Plan and Services       Living arrangements for the past 2 months: Assisted Living Facility ([facility])                                       Social Drivers of Health (SDOH) Interventions SDOH Screenings   Food Insecurity: No Food Insecurity (09/24/2024)  Housing: Low Risk (09/24/2024)  Transportation Needs: No Transportation Needs (09/24/2024)  Utilities: Not At Risk (09/24/2024)  Alcohol  Screen: Low Risk (03/31/2023)  Depression (PHQ2-9): Low Risk (04/05/2024)  Financial Resource Strain: Low Risk (03/31/2023)  Physical Activity: Sufficiently Active (03/31/2023)  Social Connections: Unknown (09/24/2024)  Stress: No Stress Concern Present (03/31/2023)  Tobacco Use: Medium Risk (09/23/2024)  Health Literacy: Adequate Health Literacy (03/31/2023)    Readmission Risk Interventions    09/12/2024   10:13 AM 08/05/2024   11:56 AM 11/19/2023    1:00 PM  Readmission Risk Prevention Plan  Transportation Screening Complete Complete Complete  PCP or Specialist Appt within 3-5 Days   Complete  HRI or Home Care Consult   Complete  Social Work Consult for Recovery Care Planning/Counseling   Complete  Palliative Care Screening   Not Applicable  Medication Review Oceanographer) Complete Complete Complete  PCP or Specialist appointment within 3-5 days of discharge Complete Complete   HRI or Home Care Consult Complete Complete   SW Recovery  Care/Counseling Consult Complete Complete   Palliative Care Screening Not Applicable Not Applicable   Skilled Nursing Facility Complete Not Applicable

## 2024-09-29 NOTE — Progress Notes (Signed)
 Mobility Specialist: Progress Note   09/29/24 1500  Mobility  Activity Pivoted/transferred from chair to bed  Level of Assistance +2 (takes two people)  Press Photographer wheel walker  Activity Response Tolerated well  Mobility Referral Yes  Mobility visit 1 Mobility  Mobility Specialist Start Time (ACUTE ONLY) 1137  Mobility Specialist Stop Time (ACUTE ONLY) 1148  Mobility Specialist Time Calculation (min) (ACUTE ONLY) 11 min    Pt received in chair, requesting assistance back to bed. Heavy modA+2 for STS and stand pivot to bed. Pt with heavy posterior lean requiring physcial assist to correct. Returned to supine with minA+2. Left in bed with all needs met, call bell in reach. Family and NP present.   Ileana Lute Mobility Specialist Please contact via SecureChat or Rehab office at [phone]

## 2024-09-29 NOTE — Plan of Care (Signed)

## 2024-09-30 LAB — BASIC METABOLIC PANEL WITH GFR
Anion gap: 10 (ref 5–15)
Anion gap: 11 (ref 5–15)
BUN: 16 mg/dL (ref 8–23)
BUN: 17 mg/dL (ref 8–23)
CO2: 30 mmol/L (ref 22–32)
CO2: 28 mmol/L (ref 22–32)
Calcium: 8 mg/dL — ABNORMAL LOW (ref 8.9–10.3)
Calcium: 7.8 mg/dL — ABNORMAL LOW (ref 8.9–10.3)
Chloride: 92 mmol/L — ABNORMAL LOW (ref 98–111)
Chloride: 90 mmol/L — ABNORMAL LOW (ref 98–111)
Creatinine, Ser: 1.63 mg/dL — ABNORMAL HIGH (ref 0.61–1.24)
Creatinine, Ser: 1.84 mg/dL — ABNORMAL HIGH (ref 0.61–1.24)
GFR, Estimated: 40 mL/min — ABNORMAL LOW
GFR, Estimated: 35 mL/min — ABNORMAL LOW
Glucose, Bld: 144 mg/dL — ABNORMAL HIGH (ref 70–99)
Glucose, Bld: 198 mg/dL — ABNORMAL HIGH (ref 70–99)
Potassium: 4.2 mmol/L (ref 3.5–5.1)
Potassium: 3.9 mmol/L (ref 3.5–5.1)
Sodium: 132 mmol/L — ABNORMAL LOW (ref 135–145)
Sodium: 129 mmol/L — ABNORMAL LOW (ref 135–145)

## 2024-09-30 LAB — CBC
HCT: 30.2 % — ABNORMAL LOW (ref 39.0–52.0)
Hemoglobin: 9.8 g/dL — ABNORMAL LOW (ref 13.0–17.0)
MCH: 27.5 pg (ref 26.0–34.0)
MCHC: 32.5 g/dL (ref 30.0–36.0)
MCV: 84.8 fL (ref 80.0–100.0)
Platelets: 335 10*3/uL (ref 150–400)
RBC: 3.56 MIL/uL — ABNORMAL LOW (ref 4.22–5.81)
RDW: 19 % — ABNORMAL HIGH (ref 11.5–15.5)
WBC: 13.5 10*3/uL — ABNORMAL HIGH (ref 4.0–10.5)
nRBC: 0 % (ref 0.0–0.2)

## 2024-09-30 LAB — COOXEMETRY PANEL
Carboxyhemoglobin: 1.8 % — ABNORMAL HIGH (ref 0.5–1.5)
Methemoglobin: <0.7 % (ref 0.0–1.5)
O2 Saturation: 72.1 %
Total hemoglobin: 10.2 g/dL — ABNORMAL LOW (ref 12.0–16.0)

## 2024-09-30 LAB — BLOOD GAS, ARTERIAL
Acid-Base Excess: 6.7 mmol/L — ABNORMAL HIGH (ref 0.0–2.0)
Bicarbonate: 30.4 mmol/L — ABNORMAL HIGH (ref 20.0–28.0)
O2 Saturation: 97 %
Patient temperature: 36.4
pCO2 arterial: 38 mmHg (ref 32–48)
pH, Arterial: 7.51 — ABNORMAL HIGH (ref 7.35–7.45)
pO2, Arterial: 83 mmHg (ref 83–108)

## 2024-09-30 LAB — GLUCOSE, CAPILLARY
Glucose-Capillary: 141 mg/dL — ABNORMAL HIGH (ref 70–99)
Glucose-Capillary: 170 mg/dL — ABNORMAL HIGH (ref 70–99)

## 2024-09-30 LAB — MAGNESIUM: Magnesium: 2.4 mg/dL (ref 1.7–2.4)

## 2024-09-30 MED ORDER — POTASSIUM CHLORIDE CRYS ER 20 MEQ PO TBCR
40.0000 meq | EXTENDED_RELEASE_TABLET | Freq: Four times a day (QID) | ORAL | Status: AC
  Administered 2024-09-30: 40 meq via ORAL
  Filled 2024-09-30 (×2): qty 2

## 2024-09-30 MED ORDER — DIPHENHYDRAMINE HCL 50 MG/ML IJ SOLN
INTRAMUSCULAR | Status: AC
  Administered 2024-09-30: 12.5 mg via INTRAVENOUS
  Filled 2024-09-30: qty 1

## 2024-09-30 MED ORDER — GLYCOPYRROLATE 0.2 MG/ML IJ SOLN: 0.2000 mg | INTRAMUSCULAR | Status: AC | PRN

## 2024-09-30 MED ORDER — HALOPERIDOL LACTATE 2 MG/ML PO CONC: 0.5000 mg | ORAL | Status: AC | PRN

## 2024-09-30 MED ORDER — ONDANSETRON HCL 4 MG/2ML IJ SOLN: 4.0000 mg | Freq: Four times a day (QID) | INTRAMUSCULAR | Status: AC | PRN

## 2024-09-30 MED ORDER — POTASSIUM CHLORIDE CRYS ER 20 MEQ PO TBCR
20.0000 meq | EXTENDED_RELEASE_TABLET | Freq: Once | ORAL | Status: AC
  Filled 2024-09-30: qty 1

## 2024-09-30 MED ORDER — HALOPERIDOL LACTATE 5 MG/ML IJ SOLN: 0.5000 mg | INTRAMUSCULAR | Status: AC | PRN

## 2024-09-30 MED ORDER — GLYCOPYRROLATE 1 MG PO TABS: 1.0000 mg | ORAL_TABLET | ORAL | Status: AC | PRN

## 2024-09-30 MED ORDER — HALOPERIDOL 0.5 MG PO TABS: 0.5000 mg | ORAL_TABLET | ORAL | Status: AC | PRN

## 2024-09-30 MED ORDER — HYDROMORPHONE HCL 1 MG/ML IJ SOLN
0.5000 mg | INTRAMUSCULAR | Status: DC | PRN
  Administered 2024-09-30: 1 mg via INTRAVENOUS
  Filled 2024-09-30: qty 1

## 2024-09-30 MED ORDER — FENTANYL CITRATE (PF) 50 MCG/ML IJ SOSY
12.5000 ug | PREFILLED_SYRINGE | INTRAMUSCULAR | Status: DC | PRN
  Administered 2024-10-01 (×3): 12.5 ug via INTRAVENOUS
  Filled 2024-09-30 (×3): qty 1

## 2024-09-30 MED ORDER — FUROSEMIDE 10 MG/ML IJ SOLN
40.0000 mg | Freq: Once | INTRAMUSCULAR | Status: AC
  Administered 2024-09-30: 40 mg via INTRAVENOUS
  Filled 2024-09-30: qty 4

## 2024-09-30 MED ORDER — DIPHENHYDRAMINE HCL 50 MG/ML IJ SOLN: 12.5000 mg | Freq: Once | INTRAMUSCULAR | Status: AC

## 2024-09-30 MED ORDER — POLYVINYL ALCOHOL 1.4 % OP SOLN: 1.0000 [drp] | Freq: Four times a day (QID) | OPHTHALMIC | Status: AC | PRN

## 2024-09-30 MED ORDER — LORAZEPAM 2 MG/ML IJ SOLN
1.0000 mg | INTRAMUSCULAR | Status: AC | PRN
  Administered 2024-09-30 – 2024-10-01 (×6): 1 mg via INTRAVENOUS
  Filled 2024-09-30 (×6): qty 1

## 2024-09-30 MED ORDER — LORAZEPAM 2 MG/ML IJ SOLN
0.5000 mg | INTRAMUSCULAR | Status: DC | PRN
  Administered 2024-09-30: 0.5 mg via INTRAVENOUS
  Filled 2024-09-30: qty 1

## 2024-09-30 MED ORDER — ONDANSETRON 4 MG PO TBDP: 4.0000 mg | ORAL_TABLET | Freq: Four times a day (QID) | ORAL | Status: AC | PRN

## 2024-09-30 MED ORDER — METOLAZONE 5 MG PO TABS
5.0000 mg | ORAL_TABLET | Freq: Once | ORAL | Status: AC
  Administered 2024-09-30: 5 mg via ORAL
  Filled 2024-09-30: qty 1

## 2024-09-30 NOTE — Progress Notes (Signed)
" °   09/30/24 2304  BiPAP/CPAP/SIPAP  $ Non-Invasive Home Ventilator  Initial  BiPAP/CPAP/SIPAP Pt Type Adult  BiPAP/CPAP/SIPAP Resmed  Mask Type Full face mask  Dentures removed? Not applicable  EPAP 5 cmH2O  FiO2 (%) 28 %  Flow Rate 2 lpm  Patient Home Machine Yes  Safety Check Completed by RT for Home Unit Yes, no issues noted  Patient Home Mask Yes  Patient Home Tubing Yes  Auto Titrate No  CPAP/SIPAP surface wiped down Yes  Device Plugged into RED Power Outlet Yes    "

## 2024-09-30 NOTE — Progress Notes (Signed)
 "                                                                                                                                                                                                         Palliative Medicine Progress Note   Patient Name: Justin [name]       Date: 09/30/2024 DOB: 11-28-1933  Age: 89 y.o. MRN#: [id number] Attending Physician: Caleen Colander, MD Primary Care Physician: Mast, Man X, NP Admit Date: 09/23/2024  Reason for Consultation/Follow-up: {Reason for Consult:23484}  HPI/Patient Profile: 89 y.o. male  with past medical history of chronic HFrEF, permanent atrial fibrillation, aortic stenosis s/p TAVR in 2018, Medtronic BiV ICD, CKD 3, and CAD s/p CABG in 2007.  He presented to the ED on 09/23/2024 with weakness, recurrent falls, and orthostatic symptoms.  He is admitted with acute on chronic HFrEF.   Palliative Medicine has been consulted for goals of care discussions and complex medical decision making.   Subjective:   16:30 - I personally helped transport patient to 6N  Objective:  Physical Exam Vitals reviewed.  Constitutional:      General: He is not in acute distress.    Appearance: He is ill-appearing.  Pulmonary:     Effort: No respiratory distress.  Neurological:     Mental Status: He is lethargic and confused.             Vital Signs: BP 115/63 (BP Location: Left Arm)   Pulse 97   Temp 98.1 F (36.7 C) (Oral)   Resp 20   Ht 5' 6 (1.676 m)   Wt 122.7 kg   SpO2 97%   BMI 43.66 kg/m  SpO2: SpO2: 97 % O2 Device: O2 Device: Nasal Cannula O2 Flow Rate: O2 Flow Rate (L/min): 2 L/min  Intake/output summary:  Intake/Output Summary (Last 24 hours) at 09/30/2024 1635 Last data filed at 09/30/2024 1132 Gross per 24 hour  Intake 240 ml  Output 1000 ml  Net -760 ml    LBM: Last BM Date : 09/28/24     Palliative Assessment/Data: ***     Palliative Medicine Assessment & Plan   Assessment: Principal Problem:   Acute on chronic  combined systolic and diastolic CHF (congestive heart failure) (HCC) Active Problems:   Hyperlipidemia   Essential hypertension   CAD, AUTOLOGOUS BYPASS GRAFT   BPH with urinary obstruction   Hypothyroidism   History of transcatheter aortic valve replacement (TAVR)   Vertigo   Atypical atrial flutter (HCC)   Biventricular implantable cardioverter-defibrillator (ICD) in situ   NSVT (nonsustained  ventricular tachycardia) (HCC)   Chronic kidney disease, stage 3a (HCC)   Type 2 diabetes mellitus (HCC)   COPD (chronic obstructive pulmonary disease) (HCC)   Hypokalemia   Prolonged QT interval   Aspiration pneumonia (HCC)    Recommendations/Plan: ***  Goals of Care and Additional Recommendations: Limitations on Scope of Treatment: {Recommended Scope and Preferences:21019}  Code Status:   Prognosis:  {Palliative Care Prognosis:23504}  Discharge Planning: {Palliative dispostion:23505}  Care plan was discussed with ***  Thank you for allowing the Palliative Medicine Team to assist in the care of this patient.   ***   Recardo KATHEE Loll, NP   Please contact Palliative Medicine Team phone at [phone] for questions and concerns.  For individual providers, please see AMION.      "

## 2024-09-30 NOTE — Progress Notes (Signed)
 Physical Therapy Treatment Patient Details Name: Justin [name] MRN: [id number] DOB: 04/27/1934 Today's Date: 09/30/2024   History of Present Illness 89 y.o. male admitted 09/23/24 with recurrent falls, volume overload. Workup for acute on chronic CHF, afib with RVR. PMH includes CHF, afib, s/p ICD, s/p TAVR, CAD s/p CABG, CKD, CKD, orthostatic hypotension.   PT Comments  Pt with significant DOE and quick to fatigue with bed mobility and attempts at sitting up; notable increase in confusion compared to prior sessions (pt stating he is home, then getting telegrams from a general; does state [facility] when asked specifically). Pt also not interested in eating; bed placed in modified chair position, assist for set up, pt eating ice pop and drinking juice. A few sessions prior, pt states plan to live in LTC SNF with his wife. Continue to recommend SNF pending pt's activity tolerance.    Reached out to Hospitalist, RN and SW regarding current status     If plan is discharge home, recommend the following: Two people to help with walking and/or transfers;A lot of help with bathing/dressing/bathroom;Assistance with cooking/housework;Assistance with feeding;Direct supervision/assist for medications management;Direct supervision/assist for financial management;Assist for transportation;Help with stairs or ramp for entrance;Supervision due to cognitive status   Can travel by private vehicle     No  Equipment Recommendations  Wheelchair (measurements PT);Wheelchair cushion (measurements PT);Rolling walker (2 wheels);BSC/3in1;Hoyer lift;Hospital bed    Recommendations for Other Services       Precautions / Restrictions Precautions Precautions: Fall;Other (comment) Recall of Precautions/Restrictions: Impaired Precaution/Restrictions Comments: h/o orthostatic hypotension, frequent falls Restrictions Weight Bearing Restrictions Per Provider Order: No     Mobility  Bed Mobility Overal bed  mobility: Needs Assistance Bed Mobility: Supine to Sit, Rolling Rolling: Mod assist   Supine to sit: Mod assist, HOB elevated, Used rails     General bed mobility comments: initiated supine>sit, heavy use of bed rails for trunk and elevation and modA for scooting hips, pt unable to come to full sitting before needing to return to supine due to DOE, fatigue; also c/o verigo (no nystagmus noted)    Transfers                   General transfer comment:  (unable this session)    Ambulation/Gait                   Stairs             Wheelchair Mobility     Tilt Bed    Modified Rankin (Stroke Patients Only)       Balance Overall balance assessment: Needs assistance                                          Communication Communication Communication: No apparent difficulties  Cognition Arousal: Alert Behavior During Therapy: Restless   PT - Cognitive impairments: Sequencing, Problem solving, Safety/Judgement, Awareness, Orientation, Memory, Attention, Initiation                       PT - Cognition Comments: pt with significant increased in confusion compared to prior sessions, states he is at home and other places but when asked he states [facility]; the general over there sent me a telegram... Following commands: Impaired Following commands impaired: Follows one step commands inconsistently    Cueing Cueing Techniques: Verbal cues,  Gestural cues, Tactile cues  Exercises      General Comments General comments (skin integrity, edema, etc.): SpO2 98% on 2L O2 [place], HR 100s-120s, BP 127/58. pt too SOB and fatigued to complete supine>sit transfers; bed placed in modified chair position and pt encouraged to eat, initially declining due to no appetite; assisted to eat ice pop and drink juice with tremulous BUEs; pt with DOE while eating. reached out to Hospitalist and SW regarding d/c plans, home with hospice vs SNF rehab  pending pt's tolerance      Pertinent Vitals/Pain Pain Assessment Pain Assessment: Faces Faces Pain Scale: Hurts little more Pain Location: knees Pain Descriptors / Indicators: Sore Pain Intervention(s): Monitored during session    Home Living                          Prior Function            PT Goals (current goals can now be found in the care plan section) Progress towards PT goals: Not progressing toward goals - comment (worsening tolerance)    Frequency    Min 2X/week      PT Plan      Co-evaluation              AM-PAC PT 6 Clicks Mobility   Outcome Measure  Help needed turning from your back to your side while in a flat bed without using bedrails?: A Lot Help needed moving from lying on your back to sitting on the side of a flat bed without using bedrails?: A Lot Help needed moving to and from a bed to a chair (including a wheelchair)?: Total Help needed standing up from a chair using your arms (e.g., wheelchair or bedside chair)?: A Lot Help needed to walk in hospital room?: Total Help needed climbing 3-5 steps with a railing? : Total 6 Click Score: 9    End of Session   Activity Tolerance: Patient limited by fatigue;Treatment limited secondary to medical complications (Comment) Patient left: in bed;with call bell/phone within reach;with bed alarm set Nurse Communication: Mobility status PT Visit Diagnosis: Unsteadiness on feet (R26.81);Other abnormalities of gait and mobility (R26.89);Muscle weakness (generalized) (M62.81);Difficulty in walking, not elsewhere classified (R26.2);History of falling (Z91.81);Pain     Time: 9253-9198 PT Time Calculation (min) (ACUTE ONLY): 15 min  Charges:    $Therapeutic Activity: 8-22 mins PT General Charges $$ ACUTE PT VISIT: 1 Visit                     Darice Almas, PT, DPT Acute Rehabilitation Services  Personal: Secure Chat Rehab Office: [phone]  Kylah Maresh L Chasidy Janak 09/30/2024, 9:09  AM

## 2024-09-30 NOTE — Significant Event (Signed)
"  ° °      CROSS COVER NOTE  NAME: Justin [name] MRN: [id number] DOB : 12/21/1933 ATTENDING PHYSICIAN: Caleen Colander, MD    Date of Service   09/30/2024   HPI/Events of Note   [facility] Cross Cover Message received from nurse patient with itching reaction to hydromorphone  HPI history and clinical course reviewed. Patient under comfort measure care  Interventions   Assessment/Plan: Benadryl  12.5 mg IV x 1 Change hydromorphone  to fentanyl  12.5 mcg IV every 2h PRN moderate to severe pain        Erminio LITTIE Cone NP [facility] Cross Cover 7pm-7am - check amion for availability Pager [phone]  "

## 2024-09-30 NOTE — Progress Notes (Addendum)
 "    Advanced Heart Failure Rounding Note  Cardiologist: [name], MD  [facility] Cardiologist: Dr. Zenaida  Chief Complaint: a/c systolic heart failure  Patient Profile   Justin [name] is a 89 y.o. male with chronic systolic heart failure, CAD s/p CABG, permanent afib and AS s/p TAVR. Patient with a third recurrent admission in the last two months. Initially with overdiuresis and inability to tolerate GDMT, prior with urosepsis, now with acute on chronic systolic heart failure and afib w/ RVR. Also w/ recurrent falls. NYHA class IV on arrival w/ elevated LA.    Significant events:   1/30: Echo EF 35-40%, GIIIDD, RV severely reduced, stable TAVR prosthesis   Subjective:    Remains on Milrinone  0.25 mcg/kg/min. Co-ox 72%   1.2L in UOP yesterday w/ Lasix  gtt at 15/hr. CVP 14 today SCr 1.55>>1.63  Hypokalemia resolved, K 4.2   Laying supine in bed. No dyspnea/orthopnea. Mildly confused this morning.   Objective:    Weight Range: 122.7 kg Body mass index is 43.66 kg/m.   Vital Signs:   Temp:  [97.7 F (36.5 C)-98.3 F (36.8 C)] 98.1 F (36.7 C) (02/05 0340) Pulse Rate:  [100-118] 100 (02/05 0340) Resp:  [18-24] 24 (02/05 0340) BP: (109-126)/(51-79) 116/51 (02/05 0340) SpO2:  [91 %-96 %] 92 % (02/05 0340) Weight:  [122.7 kg] 122.7 kg (02/05 0340) Last BM Date : 09/28/24  Weight change: Filed Weights   09/28/24 0324 09/29/24 0300 09/30/24 0340  Weight: 112.9 kg 121.7 kg 122.7 kg   Intake/Output:  Intake/Output Summary (Last 24 hours) at 09/30/2024 9286 Last data filed at 09/30/2024 0022 Gross per 24 hour  Intake 480 ml  Output 1250 ml  Net -770 ml    Physical Exam   GENERAL: Elderly, fatigued male, NAD Lungs- diminished at the bases  CARDIAC:  JVP 12 cm           Irregularly irregular rate and rhythm. No MRG. 1+ b/l knee/thigh edema  ABDOMEN: Soft, non-tender, non-distended.  EXTREMITIES: Warm and well perfused.  NEUROLOGIC: No obvious FND   Telemetry    Afib 110s (personally reviewed)  Labs   CBC Recent Labs    09/29/24 0528  WBC 12.7*  HGB 10.0*  HCT 31.3*  MCV 85.8  PLT 304    Basic Metabolic Panel Recent Labs    97/95/73 0528 09/29/24 1338 09/29/24 2205 09/30/24 0520  NA 135   < > 133* 132*  K 2.7*   < > 3.1* 4.2  CL 92*   < > 91* 92*  CO2 30   < > 31 30  GLUCOSE 212*   < > 131* 144*  BUN 16   < > 16 16  CREATININE 1.48*   < > 1.55* 1.63*  CALCIUM  8.1*   < > 8.1* 8.0*  MG 1.9  --   --  2.4   < > = values in this interval not displayed.   BNP (last 3 results) Recent Labs    07/15/24 1227 07/21/24 1615 08/03/24 1200  BNP 653.6* 716.3* 674.3*   ProBNP (last 3 results) Recent Labs    08/30/24 1150 09/07/24 1655 09/23/24 1418  PROBNP 4,372* 2,178.0* 3,327.0*   Medications:    Scheduled Medications:  allopurinol   100 mg Oral Daily   atorvastatin   20 mg Oral Daily   Chlorhexidine  Gluconate Cloth  6 each Topical Daily   dabigatran   150 mg Oral BID   docusate sodium   100 mg Oral BID  ferrous sulfate   325 mg Oral Q breakfast   insulin  aspart  0-9 Units Subcutaneous TID WC   levothyroxine   175 mcg Oral QAC breakfast   montelukast   10 mg Oral QHS   pantoprazole   40 mg Oral BID   sodium chloride  flush  10-40 mL Intracatheter Q12H   sodium chloride  flush  3 mL Intravenous Q12H   spironolactone   50 mg Oral Daily   tamsulosin   0.4 mg Oral QHS   venlafaxine  XR  37.5 mg Oral Daily    Infusions:  amiodarone  60 mg/hr (09/30/24 0213)   furosemide  (LASIX ) 200 mg in dextrose  5 % 100 mL (2 mg/mL) infusion 15 mg/hr (09/29/24 1956)   milrinone  0.25 mcg/kg/min (09/29/24 2032)    PRN Medications: acetaminophen  **OR** acetaminophen , albuterol , alum & mag hydroxide-simeth, bisacodyl , HYDROcodone -acetaminophen , polyethylene glycol, sodium chloride  flush, sodium chloride  flush  Assessment/Plan   1. End-stage, Acute on chronic systolic CHF: Medtronic CRT-D.  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  Echo this admission with EF 35-40%, severe RV dysfunction, TAVR valve stable. Suspect end stage biventricular failure, now requiring milrinone  to aid diuresis. Pt and family now open to home hospice support once diuresed.   - remains milrinone  0.25. Co-ox 72%. CVP 14  - continue Lasix  gtt at 15/hr. Now that hypokalemia has been corrected, will push diuresis. Add metolazone  5 mg po x 1 to augment - continue milrinone  until better diuresed, then will begin wean  - Continue Spironolactone  50 mg daily (watch SCr)  - Off ARB/ARNI for now with soft BP.  - No Farxiga  with recent UTI/urosepsis.  - Continue unna boots  - Palliative Care team following for GOC. Family would want home hospice  2. Atrial fibrillation: Permanent. HR elevated to 120-130s at times. 110s today  - Continue amio gtt for rate control while on milrinone . - Continue Pradaxa .  3. CAD: CABG 2007. Denies CP   - Continue statin.  - No ASA with Pradaxa  use.  4. PVCs: Frequent. - Amiodarone  gtt for now.  5. AS s/p TAVR: Valve stable on echo this admission.  6. Hypokalemia: corrected  - monitor w/ diuresis  - continue spiro 50 mg daily   Length of Stay: [address] PA-C  09/30/2024, 7:13 AM  Advanced Heart Failure Team Pager [phone] (M-F; 7a - 5p)   Please visit [email or web address]: For overnight coverage please call cardiology fellow first. If fellow not available call Shock/ECMO MD on call.  For ECMO / Mechanical Support (Impella, IABP, LVAD) issues call Shock / ECMO MD on call.    Patient seen with PA, I formulated the plan and agree with the above note.   He remains on milrinone  0.25, co-ox 72% this morning.  K now normalized.  Creatinine stable 1.63.  CVP still elevated at 14.  He did not receive metolazone  yesterday due to low K.   He is confused this morning, knows he is in [place] but not the hospital.    Still in AF with RVR 100s-110s on amiodarone  gtt for rate control.    General: NAD Neck: JVP 14 cm, no thyromegaly or thyroid  nodule.  Lungs: Clear to auscultation bilaterally with normal respiratory effort. CV: Nondisplaced PMI.  Heart tachy, irregular S1/S2, no S3/S4, no murmur.  No peripheral edema.   Abdomen: Soft, nontender, no hepatosplenomegaly, no distention.  Skin: Intact without lesions or rashes.  Neurologic: Alert but mildly confused.   Psych: Normal affect. Extremities: No clubbing or cyanosis.  HEENT: Normal.  Suspect end stage biventricular failure, now requiring milrinone  to aid diuresis. Co-ox 72% today. He is still volume overloaded with CVP 14.  Creatinine stable 1.6.   - Continue milrinone  0.25 mcg/kg/min, wean when diuresis is completed.  - Would keep Lasix  gtt 15 mg/hr today and will give metolazone  5 mg x 1 to augment diuresis.  He will need repeat BMET in pm to follow K.  - Continue spironolactone .    Patient and daughter are interested in home with hospice once he is diuresed and ready for home.  Have consulted palliative care service.    More confused today, check ABG and NH3. Afebrile, check CBC for WBCs.   Ezra Shuck 09/30/2024 9:31 AM     "

## 2024-09-30 NOTE — Progress Notes (Signed)
 " PROGRESS NOTE    Justin [name]  FMW:[id number] DOB: [birth year] DOA: 09/23/2024 PCP: Mast, Man X, NP  Subjective: Patient confused and mumbling most of his words. Told me that he was at home.   Hospital Course: 89/M with chronic systolic CHF with RV failure, permanent atrial fibrillation, TAVR, Medtronic ICD, CAD/CABG, CKD 3, recently admitted with ESBL E. coli urosepsis, AKI CKD 3, GDMT was discontinued, seen in heart failure clinic 1/26 noted to have fluid retention, restarted on torsemide . - Back in the ED 1/29 with recurrent falls, volume overload by OptiVol, labs noted creatinine of 1.3, proBNP 3300, troponin 43, lactate 2.4, CT head with chronic disease.  Patient reports 2 of his falls were where he slid down from a chair and last episode yesterday was a true fall, history of orthostatic symptoms as well - Admitted, started on diuretics, cards following - Palliative care consulted - 2/1, lactate trending up - 2/2 started on milrinone  and Lasix  gtt - 2/5 - made comfort care after discussing with family    Assessment and Plan:  Acute on chronic systolic CHF - Last echo 3/25 with EF 40-45%, moderately reduced RV. Repeat echo this admission with EF 35-40%, grade 3 DD, restrictive, severely reduced RV - Followed by CHF team,  concerns that he may be nearing end-stage, he is DNR - This is his third hospitalization in 1 to 2 months, palliative consulted and following - milrinone  was started to air diuresis, continue - continue lasix  gtt, metolazone  x1 added today to augment diuresis - continue aldactone  - palliative discussed with daughters, and opted for comfort care measures at this point. They would like patient's spouse to visit him, but she has dementia, therefore being moved to 5N    Frequent falls - Does not have any localizing signs, CT head, C-spine with chronic disease - Has some orthostatic symptoms as well, B12 is normal, orthostatics negative - Likely related to right  heart failure, A-fib RVR and general debility   Permanent A-fib -With RVR - Beta-blocker on hold now, continue amiodarone  drip, Pradaxa    CAD/CABG - Continue atorvastatin , Pradaxa    Hypokalemia Repleted and improved now, recheck afternoon BMP   TAVR - valve stable on TTE this admission   Hypothyroidism - continue levothyroxine     COPD - Stable continue home nebs   Type 2 diabetes mellitus - Recent A1c was 6.2, SSI for now   CKD 3A - Stable, monitor, recent admission with AKI    DVT prophylaxis:  dabigatran  (PRADAXA ) capsule 150 mg     Code Status: Do not attempt resuscitation (DNR) - Comfort care Disposition Plan: Hospice Reason for continuing need for hospitalization: Not med ready   Objective: Vitals:   09/30/24 0340 09/30/24 0736 09/30/24 1125 09/30/24 1128  BP: (!) 116/51 (!) 127/58 107/60 107/60  Pulse: 100 (!) 112 (!) 106 97  Resp: (!) 24 20 20 20   Temp: 98.1 F (36.7 C) (!) 97.5 F (36.4 C) 98.1 F (36.7 C)   TempSrc: Oral Oral Oral   SpO2: 92% 91% 97% 97%  Weight: 122.7 kg     Height:        Intake/Output Summary (Last 24 hours) at 09/30/2024 1447 Last data filed at 09/30/2024 1132 Gross per 24 hour  Intake 240 ml  Output 1000 ml  Net -760 ml   Filed Weights   09/28/24 0324 09/29/24 0300 09/30/24 0340  Weight: 112.9 kg 121.7 kg 122.7 kg    Examination:  Physical Exam Vitals and nursing  note reviewed.  Constitutional:      General: He is not in acute distress.    Appearance: He is ill-appearing.  Cardiovascular:     Rate and Rhythm: Normal rate.  Pulmonary:     Effort: No respiratory distress.  Abdominal:     General: There is distension.  Musculoskeletal:     Right lower leg: Edema present.     Left lower leg: Edema present.  Neurological:     Mental Status: He is disoriented.     Data Reviewed: I have personally reviewed following labs and imaging studies  CBC: Recent Labs  Lab 09/24/24 0405 09/25/24 0238 09/29/24 0528  09/30/24 1006  WBC 10.2 9.6 12.7* 13.5*  HGB 10.9* 10.4* 10.0* 9.8*  HCT 35.3* 33.3* 31.3* 30.2*  MCV 87.8 86.9 85.8 84.8  PLT 297 281 304 335   Basic Metabolic Panel: Recent Labs  Lab 09/24/24 0405 09/25/24 0238 09/26/24 0139 09/26/24 0140 09/28/24 0440 09/28/24 1630 09/29/24 0528 09/29/24 1338 09/29/24 2205 09/30/24 0520  NA 141   < >  --    < > 137 135 135 134* 133* 132*  K 3.3*   < >  --    < > 3.4* 3.2* 2.7* 2.9* 3.1* 4.2  CL 100   < >  --    < > 96* 93* 92* 92* 91* 92*  CO2 29   < >  --    < > 31 31 30  32 31 30  GLUCOSE 106*   < >  --    < > 140* 221* 212* 153* 131* 144*  BUN 18   < >  --    < > 16 17 16 16 16 16   CREATININE 1.26*   < >  --    < > 1.51* 1.53* 1.48* 1.57* 1.55* 1.63*  CALCIUM  8.3*   < >  --    < > 8.4* 8.3* 8.1* 8.2* 8.1* 8.0*  MG 1.8  --  2.0  --  2.1  --  1.9  --   --  2.4  PHOS 3.4  --   --   --   --   --   --   --   --   --    < > = values in this interval not displayed.   GFR: Estimated Creatinine Clearance: 38.7 mL/min (A) (by C-G formula based on SCr of 1.63 mg/dL (H)). Liver Function Tests: Recent Labs  Lab 09/24/24 0405  AST 30  ALT 21  ALKPHOS 132*  BILITOT 0.8  PROT 6.2*  ALBUMIN  2.5*   No results for input(s): LIPASE, AMYLASE in the last 168 hours. No results for input(s): AMMONIA in the last 168 hours. Coagulation Profile: No results for input(s): INR, PROTIME in the last 168 hours. Cardiac Enzymes: No results for input(s): CKTOTAL, CKMB, CKMBINDEX, TROPONINI in the last 168 hours. ProBNP, BNP (last 5 results) Recent Labs    01/16/24 1223 04/14/24 1637 07/15/24 1227 07/21/24 1615 08/03/24 1200 08/13/24 1248 08/30/24 1150 09/07/24 1655 09/23/24 1418  PROBNP  --   --   --   --   --  4,624.0* 4,372* 2,178.0* 3,327.0*  BNP 823.5* 489.4* 653.6* 716.3* 674.3*  --   --   --   --    HbA1C: No results for input(s): HGBA1C in the last 72 hours. CBG: Recent Labs  Lab 09/29/24 1052 09/29/24 1649  09/29/24 2051 09/30/24 0620 09/30/24 1124  GLUCAP 194* 160* 158* 141* 170*  Lipid Profile: No results for input(s): CHOL, HDL, LDLCALC, TRIG, CHOLHDL, LDLDIRECT in the last 72 hours. Thyroid  Function Tests: No results for input(s): TSH, T4TOTAL, FREET4, T3FREE, THYROIDAB in the last 72 hours. Anemia Panel: No results for input(s): VITAMINB12, FOLATE, FERRITIN, TIBC, IRON, RETICCTPCT in the last 72 hours. Sepsis Labs: Recent Labs  Lab 09/23/24 1646 09/23/24 1710 09/25/24 1002 09/25/24 1216 09/27/24 0859 09/28/24 0440  PROCALCITON <0.10  --   --   --   --   --   LATICACIDVEN  --    < > 4.6* 3.4* 4.1* 1.8   < > = values in this interval not displayed.    Recent Results (from the past 240 hours)  Culture, blood (routine x 2)     Status: None   Collection Time: 09/23/24  2:10 PM   Specimen: BLOOD LEFT ARM  Result Value Ref Range Status   Specimen Description BLOOD LEFT ARM  Final   Special Requests   Final    BOTTLES DRAWN AEROBIC AND ANAEROBIC Blood Culture adequate volume   Culture   Final    NO GROWTH 5 DAYS Performed at [facility], [address] [place], [place] [address]    Report Status 09/28/2024 FINAL  Final  Resp panel by RT-PCR (RSV, Flu A&B, Covid) Anterior Nasal Swab     Status: None   Collection Time: 09/23/24 11:26 PM   Specimen: Anterior Nasal Swab  Result Value Ref Range Status   SARS Coronavirus 2 by RT PCR NEGATIVE NEGATIVE Final   Influenza A by PCR NEGATIVE NEGATIVE Final   Influenza B by PCR NEGATIVE NEGATIVE Final    Comment: (NOTE) The Xpert Xpress SARS-CoV-2/FLU/RSV plus assay is intended as an aid in the diagnosis of influenza from Nasopharyngeal swab specimens and should not be used as a sole basis for treatment. Nasal washings and aspirates are unacceptable for Xpert Xpress SARS-CoV-2/FLU/RSV testing.  Fact Sheet for Patients: bloggercourse.com  Fact Sheet for Healthcare  Providers: seriousbroker.it  This test is not yet approved or cleared by the [place]  FDA and has been authorized for detection and/or diagnosis of SARS-CoV-2 by FDA under an Emergency Use Authorization (EUA). This EUA will remain in effect (meaning this test can be used) for the duration of the COVID-19 declaration under Section 564(b)(1) of the Act, 21 U.S.C. section 360bbb-3(b)(1), unless the authorization is terminated or revoked.     Resp Syncytial Virus by PCR NEGATIVE NEGATIVE Final    Comment: (NOTE) Fact Sheet for Patients: bloggercourse.com  Fact Sheet for Healthcare Providers: seriousbroker.it  This test is not yet approved or cleared by the [place]  FDA and has been authorized for detection and/or diagnosis of SARS-CoV-2 by FDA under an Emergency Use Authorization (EUA). This EUA will remain in effect (meaning this test can be used) for the duration of the COVID-19 declaration under Section 564(b)(1) of the Act, 21 U.S.C. section 360bbb-3(b)(1), unless the authorization is terminated or revoked.  Performed at [facility], [address], [place], [place] [address]   Culture, blood (routine x 2)     Status: None   Collection Time: 09/23/24 11:29 PM   Specimen: BLOOD LEFT HAND  Result Value Ref Range Status   Specimen Description BLOOD LEFT HAND  Final   Special Requests   Final    BOTTLES DRAWN AEROBIC ONLY Blood Culture adequate volume   Culture   Final    NO GROWTH 5 DAYS Performed at [facility],  [address] [place], [place] [address]    Report Status 09/28/2024 FINAL  Final     Radiology Studies: DG CHEST PORT 1 VIEW Result Date: 09/28/2024 EXAM: 1 VIEW(S) XRAY OF THE CHEST 09/28/2024 06:54:00 PM COMPARISON: 09/27/2024 CLINICAL HISTORY: Hypoxia. FINDINGS: LINES, TUBES AND DEVICES: Right upper extremity PICC in place with tip at superior cavoatrial  junction. LUNGS AND PLEURA: Bilateral increased interstitial markings. Trace bilateral pleural effusions. No pneumothorax. HEART AND MEDIASTINUM: Left chest AICD/pacemaker noted. Status post TAVR. Stable cardiomegaly. Intact sternotomy wires. BONES AND SOFT TISSUES: Intact sternotomy wires. No acute osseous abnormality. IMPRESSION: 1. Bilateral increased interstitial markings with trace bilateral pleural effusions. 2. Stable cardiomegaly. Electronically signed by: Greig Pique MD 09/28/2024 09:09 PM EST RP Workstation: [id number]    Scheduled Meds:  Chlorhexidine  Gluconate Cloth  6 each Topical Daily   dabigatran   150 mg Oral BID   pantoprazole   40 mg Oral BID   potassium chloride   40 mEq Oral Q6H   sodium chloride  flush  10-40 mL Intracatheter Q12H   sodium chloride  flush  3 mL Intravenous Q12H   spironolactone   50 mg Oral Daily   tamsulosin   0.4 mg Oral QHS   Continuous Infusions:  milrinone  0.25 mcg/kg/min (09/30/24 0741)     LOS: 7 days   Time spent: 40 minutes  Casimer Dare, MD  [facility]  09/30/2024, 2:47 PM   "

## 2024-09-30 NOTE — Progress Notes (Signed)
 Received pt from 3E-pt settled in room-placed on 2L o2-family at bedside

## 2024-09-30 NOTE — Evaluation (Signed)
 Clinical/Bedside Swallow Evaluation Patient Details  Name: Justin [name] MRN: [id number] Date of Birth: 05-07-1934  Today's Date: 09/30/2024 Time: SLP Start Time (ACUTE ONLY): 1030 SLP Stop Time (ACUTE ONLY): 1100 SLP Time Calculation (min) (ACUTE ONLY): 30 min  Past Medical History:  Past Medical History:  Diagnosis Date   Age-related macular degeneration, dry, both eyes    Allergic    24/7; 365 days/year; I'm allergic to pollens, dust, all southern grasses/trees, mold, mildue, cats, dogs (01/07/2017)   Anal fissure    Asthma    sees Dr. Alaine    Benign prostatic hypertrophy    (sees Dr. Colan   CAD (coronary artery disease)    a. s/p CABG 2007. b. Cath 08/2016 - 4/4 patent grafts.   Carotid bruit    carotid u/s 10/10: 0.39% bilaterally   Chronic combined systolic and diastolic CHF (congestive heart failure) (HCC)    Complication of anesthesia 1980s   w/anal cyst OR, he gave me a saddle block then put a narcotic in spinal cord; had a severe reaction to that (01/07/2017)   Congestive heart failure (CHF) (HCC)    Coronavirus infection 11/18/2023   ED (erectile dysfunction)    Family history of adverse reaction to anesthesia    daughter wakes up during OR (01/07/2017)   GERD (gastroesophageal reflux disease)    Gout    HTN (hypertension)    Hx of colonic polyps    (sees Dr. Abran)   Hyperlipidemia    Hypothyroidism    Moderate to severe aortic stenosis    a. s/p TAVR 02/2017.   Myocardial infarction [facility]) ~ 2000   Obesity    Osteoarthritis    was in my knees, hands (01/07/2017 )   PAF (paroxysmal atrial fibrillation) (HCC)    a. documented post TAVR.   Precancerous skin lesion    (sees Dr. 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   29 mm Edwards Sapien 3 transcatheter heart valve placed via percutaneous right transfemoral approach   Past Surgical History:  Past  Surgical History:  Procedure Laterality Date   ANUS SURGERY     opened it back up cause it wouldn't heal; wound up w/a fissure (01/07/2017)   BIV ICD INSERTION CRT-D N/A 05/02/2017   Procedure: BIV ICD INSERTION CRT-D;  Surgeon: [name], MD;  Location: [facility] INVASIVE CV LAB;  Service: Cardiovascular;  Laterality: N/A;   CARDIAC CATHETERIZATION  10/29/2005   CARDIAC CATHETERIZATION N/A 08/27/2016   Procedure: Right/Left Heart Cath and Coronary/Graft Angiography;  Surgeon: Lonni JONETTA Cash, MD;  Location: [facility] INVASIVE CV LAB;  Service: Cardiovascular;  Laterality: N/A;   CARDIOVERSION N/A 04/24/2021   Procedure: CARDIOVERSION;  Surgeon: Okey Vina GAILS, MD;  Location: [facility] ENDOSCOPY;  Service: Cardiovascular;  Laterality: N/A;   CARDIOVERSION N/A 11/08/2021   Procedure: CARDIOVERSION;  Surgeon: Mona Vinie BROCKS, MD;  Location: [facility] ENDOSCOPY;  Service: Cardiovascular;  Laterality: N/A;   CARDIOVERSION N/A 02/06/2022   Procedure: CARDIOVERSION;  Surgeon: Delford Maude BROCKS, MD;  Location: [facility] ENDOSCOPY;  Service: Cardiovascular;  Laterality: N/A;   CATARACT EXTRACTION W/ INTRAOCULAR LENS  IMPLANT, BILATERAL Bilateral    CATARACT EXTRACTION, BILATERAL  2012   COLONOSCOPY  06/30/2008   no repeats needed    COLONOSCOPY     had 3 or 4 in the past    CORONARY ARTERY BYPASS GRAFT  2007   CABG X4  CYST EXCISION PERINEAL  1980s   HAMMER TOE SURGERY Bilateral    ICD GENERATOR CHANGEOUT N/A 05/30/2023   Procedure: ICD GENERATOR CHANGEOUT;  Surgeon: [name], MD;  Location: [facility] INVASIVE CV LAB;  Service: Cardiovascular;  Laterality: N/A;   JOINT REPLACEMENT     KNEE ARTHROPLASTY  07/30/2011   Procedure: COMPUTER ASSISTED TOTAL KNEE ARTHROPLASTY;  Surgeon: Cordella Glendia Hutchinson;  Location: [facility] OR;  Service: Orthopedics;  Laterality: Left;  left total knee arthroplasty   MASTECTOMY SUBCUTANEOUS Bilateral    MULTIPLE TOOTH EXTRACTIONS     ORIF FINGER / THUMB FRACTURE Right ~ 1980   repair of thumb injury    PROSTATE BIOPSY     REPLACEMENT TOTAL KNEE BILATERAL Bilateral 2012   TEE WITHOUT CARDIOVERSION N/A 03/04/2017   Procedure: TRANSESOPHAGEAL ECHOCARDIOGRAM (TEE);  Surgeon: [name], MD;  Location: [facility] OR;  Service: Open Heart Surgery;  Laterality: N/A;   TOTAL KNEE REVISION Right 05/18/2019   Procedure: RIGHT PATELLA REVISION/REMOVAL;  Surgeon: Hutchinson Cordella Glendia, MD;  Location: [facility] OR;  Service: Orthopedics;  Laterality: Right;   TRANSCATHETER AORTIC VALVE REPLACEMENT, TRANSFEMORAL N/A 03/04/2017   Procedure: TRANSCATHETER AORTIC VALVE REPLACEMENT, TRANSFEMORAL;  Surgeon: [name], MD;  Location: [facility] OR;  Service: Open Heart Surgery;  Laterality: N/A;   HPI:  Justin [name] is an 89 y.o. male who presented to the ED on 09/23/2024 with weakness, recurrent falls, and orthostatic symptoms. He is admitted with acute on chronic HFrEF. This is his third recent admission.  Seen by SLP 1/30 with recommendations for regular/thin with no further ST needs. Reconsulted 2/5, hospitalist note 2/4: Feels better overall, breathing is improving, reports increased cough after meals  Pt with past medical history of chronic HFrEF, permanent atrial fibrillation, aortic stenosis s/p TAVR in 2018, Medtronic BiV ICD, CKD 3, and CAD s/p CABG in 2007.    Assessment / Plan / Recommendation  Clinical Impression  Pt presents with clinical indicators of pharyngeal dysphagia.  Pt exhibited intermittent throat clear following thin liquids, typically delayed with both cup and straw presentation.  Pt with hx GERD and symptoms seem consistent with reflux cough.  Family arrived during session.  They report frequent throat clearing at baseline, increased post nasal drip, and no pna since admission in March of last year when MBSS was completed.  Pt does not use straws at home, but has needed them while in the hospital, but still taking single sips. Pt's swallow function is seemingly consistent with baseline level  of function and with evaluation completed on 09/24/24.    Recommend continuing regular teet with thin liquids.    Family report concerns for changes to speech and would like formal dysarthria evaluation. Head CT negative for acute findings, but with B chronic cerebellar infarcts.  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  Pt may not be compensating as well for chronic issues with current acute illness.  Family reports changes to speech have been persistent for about 3 weeks.  Will reach out to medical team to request assessment orders if within the goals of care.   SLP Visit Diagnosis: Dysphagia, unspecified (R13.10)    Aspiration Risk  Mild aspiration risk    Diet Recommendation Regular;Thin liquid    Liquid Administration via: Cup;Straw (single sips) Medication Administration: Whole meds with puree Supervision: Patient able to self feed Compensations: Slow rate;Small sips/bites Postural Changes: Seated upright at 90 degrees;Remain upright for at least 30 minutes after po  intake    Other Recommendations Oral Care Recommendations: Oral care BID      Functional Status Assessment Patient has had a recent decline in their functional status and demonstrates the ability to make significant improvements in function in a reasonable and predictable amount of time.  Frequency and Duration min 2x/week  2 weeks       Prognosis Prognosis for improved oropharyngeal function: Good      Swallow Study   General HPI: Justin Walbert is an 89 y.o. male who presented to the ED on 09/23/2024 with weakness, recurrent falls, and orthostatic symptoms. He is admitted with acute on chronic HFrEF. This is his third recent admission.  Seen by SLP 1/30 with recommendations for regular/thin with no further ST needs. Reconsulted 2/5, hospitalist note 2/4: Feels better overall, breathing is improving, reports increased cough after meals  Pt with past medical history of  chronic HFrEF, permanent atrial fibrillation, aortic stenosis s/p TAVR in 2018, Medtronic BiV ICD, CKD 3, and CAD s/p CABG in 2007. Type of Study: Bedside Swallow Evaluation Previous Swallow Assessment: BSE 09/24/24. MBS 11/24/23 with recommendations of a regular diet and thin liquids  recommendation for small, single cup sips of thin liquids and not straws, no consecutive cup sips of thin liquids; GERD precautions Diet Prior to this Study: Regular;Thin liquids (Level 0) Temperature Spikes Noted: No Respiratory Status: Nasal cannula History of Recent Intubation: No Behavior/Cognition: Alert;Cooperative;Confused Oral Cavity Assessment: Within Functional Limits Oral Care Completed by SLP: No Oral Cavity - Dentition: Adequate natural dentition Vision: Functional for self-feeding Self-Feeding Abilities: Able to feed self Baseline Vocal Quality: Normal Volitional Cough: Strong Volitional Swallow: Able to elicit    Oral/Motor/Sensory Function Overall Oral Motor/Sensory Function: Within functional limits   Ice Chips Ice chips: Not tested   Thin Liquid Thin Liquid: Impaired Presentation: Cup;Straw Pharyngeal  Phase Impairments: Throat Clearing - Delayed    Nectar Thick Nectar Thick Liquid: Not tested   Honey Thick Honey Thick Liquid: Not tested   Puree Puree: Within functional limits   Solid     Solid: Within functional limits Presentation: Self Fed      Anette FORBES Grippe, MA, CCC-SLP Acute Rehabilitation Services Office: [phone] 09/30/2024,11:59 AM

## 2024-09-30 NOTE — Plan of Care (Signed)
   Problem: Education: Goal: Ability to describe self-care measures that may prevent or decrease complications (Diabetes Survival Skills Education) will improve Outcome: Progressing Goal: Individualized Educational Video(s) Outcome: Progressing   Problem: Coping: Goal: Ability to adjust to condition or change in health will improve Outcome: Progressing   Problem: Fluid Volume: Goal: Ability to maintain a balanced intake and output will improve Outcome: Progressing   Problem: Health Behavior/Discharge Planning: Goal: Ability to identify and utilize available resources and services will improve Outcome: Progressing Goal: Ability to manage health-related needs will improve Outcome: Progressing   Problem: Metabolic: Goal: Ability to maintain appropriate glucose levels will improve Outcome: Progressing   Problem: Nutritional: Goal: Maintenance of adequate nutrition will improve Outcome: Progressing Goal: Progress toward achieving an optimal weight will improve Outcome: Progressing   Problem: Skin Integrity: Goal: Risk for impaired skin integrity will decrease Outcome: Progressing   Problem: Tissue Perfusion: Goal: Adequacy of tissue perfusion will improve Outcome: Progressing   Problem: Education: Goal: Knowledge of General Education information will improve Description: Including pain rating scale, medication(s)/side effects and non-pharmacologic comfort measures Outcome: Progressing   Problem: Health Behavior/Discharge Planning: Goal: Ability to manage health-related needs will improve Outcome: Progressing   Problem: Clinical Measurements: Goal: Ability to maintain clinical measurements within normal limits will improve Outcome: Progressing Goal: Will remain free from infection Outcome: Progressing Goal: Diagnostic test results will improve Outcome: Progressing Goal: Respiratory complications will improve Outcome: Progressing Goal: Cardiovascular complication will  be avoided Outcome: Progressing   Problem: Activity: Goal: Risk for activity intolerance will decrease Outcome: Progressing   Problem: Nutrition: Goal: Adequate nutrition will be maintained Outcome: Progressing   Problem: Coping: Goal: Level of anxiety will decrease Outcome: Progressing   Problem: Elimination: Goal: Will not experience complications related to bowel motility Outcome: Progressing Goal: Will not experience complications related to urinary retention Outcome: Progressing   Problem: Pain Managment: Goal: General experience of comfort will improve and/or be controlled Outcome: Progressing   Problem: Safety: Goal: Ability to remain free from injury will improve Outcome: Progressing   Problem: Skin Integrity: Goal: Risk for impaired skin integrity will decrease Outcome: Progressing   Problem: Education: Goal: Ability to demonstrate management of disease process will improve Outcome: Progressing Goal: Ability to verbalize understanding of medication therapies will improve Outcome: Progressing Goal: Individualized Educational Video(s) Outcome: Progressing   Problem: Activity: Goal: Capacity to carry out activities will improve Outcome: Progressing   Problem: Cardiac: Goal: Ability to achieve and maintain adequate cardiopulmonary perfusion will improve Outcome: Progressing   Problem: Activity: Goal: Ability to tolerate increased activity will improve Outcome: Progressing   Problem: Clinical Measurements: Goal: Ability to maintain a body temperature in the normal range will improve Outcome: Progressing   Problem: Respiratory: Goal: Ability to maintain adequate ventilation will improve Outcome: Progressing Goal: Ability to maintain a clear airway will improve Outcome: Progressing

## 2024-10-01 LAB — AMMONIA: Ammonia: 25 umol/L (ref 9–35)

## 2024-10-01 LAB — MAGNESIUM: Magnesium: 2.4 mg/dL (ref 1.7–2.4)

## 2024-10-01 MED ORDER — FUROSEMIDE 10 MG/ML IJ SOLN
40.0000 mg | Freq: Once | INTRAMUSCULAR | Status: AC
  Administered 2024-10-01: 40 mg via INTRAVENOUS
  Filled 2024-10-01: qty 4

## 2024-10-01 MED ORDER — FENTANYL 2500MCG IN NS 250ML (10MCG/ML) PREMIX INFUSION
25.0000 ug/h | INTRAVENOUS | Status: AC
  Administered 2024-10-01: 25 ug/h via INTRAVENOUS
  Filled 2024-10-01: qty 250

## 2024-10-01 MED ORDER — FENTANYL BOLUS VIA INFUSION: 25.0000 ug | INTRAVENOUS | Status: AC | PRN

## 2024-10-01 NOTE — Progress Notes (Signed)
 Respectfully, due to patient on Comfort Care Measures, Important Message Letter will be mailed to patient address on file.

## 2024-10-01 NOTE — Progress Notes (Signed)
 Patient ID: Justin [name], male   DOB: [birth year], 89 y.o.   MRN: [id number]     Advanced Heart Failure Rounding Note  Cardiologist: [name], MD  [facility] Cardiologist: Dr. Zenaida  Chief Complaint: a/c systolic heart failure  Patient Profile   Justin [name] is a 89 y.o. male with chronic systolic heart failure, CAD s/p CABG, permanent afib and AS s/p TAVR. Patient with a third recurrent admission in the last two months. Initially with overdiuresis and inability to tolerate GDMT, prior with urosepsis, now with acute on chronic systolic heart failure and afib w/ RVR. Also w/ recurrent falls. NYHA class IV on arrival w/ elevated LA.    Significant events:   1/30: Echo EF 35-40%, GIIIDD, RV severely reduced, stable TAVR prosthesis   Subjective:    Transition made to full comfort care yesterday and off milrinone .  He is minimally responsive this morning, has oxygen by face mask.   Objective:    Weight Range: 122.7 kg Body mass index is 43.66 kg/m.   Vital Signs:   Temp:  [98.1 F (36.7 C)-98.4 F (36.9 C)] 98.4 F (36.9 C) (02/06 0615) Pulse Rate:  [97-106] 104 (02/06 0615) Resp:  [19-20] 19 (02/06 0615) BP: (107-130)/(60-75) 130/75 (02/06 0615) SpO2:  [97 %] 97 % (02/06 0615) FiO2 (%):  [28 %] 28 % (02/05 2304) Last BM Date : 09/26/24  Weight change: Filed Weights   09/28/24 0324 09/29/24 0300 09/30/24 0340  Weight: 112.9 kg 121.7 kg 122.7 kg   Intake/Output:  Intake/Output Summary (Last 24 hours) at 10/01/2024 1045 Last data filed at 10/01/2024 0447 Gross per 24 hour  Intake 0 ml  Output 800 ml  Net -800 ml    Physical Exam   General: NAD Neck: JVP 12 cm, no thyromegaly or thyroid  nodule.  Lungs: Clear to auscultation bilaterally with normal respiratory effort. CV: Nondisplaced PMI.  Heart irregular S1/S2, no S3/S4, no murmur.  1+ ankle edema.   Abdomen: Soft, nontender, no hepatosplenomegaly, no distention.  Skin: Intact without lesions or rashes.   Neurologic: Obtunded.  Extremities: No clubbing or cyanosis.  HEENT: Normal.   Telemetry   No longer on telemetry  Labs   CBC Recent Labs    09/29/24 0528 09/30/24 1006  WBC 12.7* 13.5*  HGB 10.0* 9.8*  HCT 31.3* 30.2*  MCV 85.8 84.8  PLT 304 335    Basic Metabolic Panel Recent Labs    97/94/73 0520 09/30/24 1428 10/01/24 0016  NA 132* 129*  --   K 4.2 3.9  --   CL 92* 90*  --   CO2 30 28  --   GLUCOSE 144* 198*  --   BUN 16 17  --   CREATININE 1.63* 1.84*  --   CALCIUM  8.0* 7.8*  --   MG 2.4  --  2.4   BNP (last 3 results) Recent Labs    07/15/24 1227 07/21/24 1615 08/03/24 1200  BNP 653.6* 716.3* 674.3*   ProBNP (last 3 results) Recent Labs    08/30/24 1150 09/07/24 1655 09/23/24 1418  PROBNP 4,372* 2,178.0* 3,327.0*   Medications:    Scheduled Medications:  Chlorhexidine  Gluconate Cloth  6 each Topical Daily   dabigatran   150 mg Oral BID   pantoprazole   40 mg Oral BID   potassium chloride   20 mEq Oral Once   sodium chloride  flush  10-40 mL Intracatheter Q12H   sodium chloride  flush  3 mL Intravenous Q12H   spironolactone   50 mg  Oral Daily   tamsulosin   0.4 mg Oral QHS    Infusions:    PRN Medications: acetaminophen  **OR** acetaminophen , albuterol , alum & mag hydroxide-simeth, artificial tears, bisacodyl , fentaNYL  (SUBLIMAZE ) injection, glycopyrrolate  **OR** glycopyrrolate  **OR** glycopyrrolate , haloperidol  **OR** haloperidol  **OR** haloperidol  lactate, LORazepam , ondansetron  **OR** ondansetron  (ZOFRAN ) IV, polyethylene glycol, sodium chloride  flush, sodium chloride  flush  Assessment/Plan   Suspect end stage biventricular failure, he is now off milrinone  and has transitioned to full comfort care.  He is not responsive this morning but appears comfortable.  Cardiology will sign off.   Justin [name] 10/01/2024 10:45 AM

## 2024-10-01 NOTE — Progress Notes (Incomplete)
 "                                                                                                                                                                                                         Palliative Medicine Progress Note   Patient Name: Justin [name]       Date: 10/01/2024 DOB: [birth year]  Age: 89 y.o. MRN#: [id number] Attending Physician: Drusilla Sabas RAMAN, MD Primary Care Physician: Mast, Man X, NP Admit Date: 09/23/2024  Reason for Consultation/Follow-up: {Reason for Consult:23484}  HPI/Patient Profile: 89 y.o. male  with past medical history of chronic HFrEF, permanent atrial fibrillation, aortic stenosis s/p TAVR in 2018, Medtronic BiV ICD, CKD 3, and CAD s/p CABG in 2007.  He presented to the ED on 09/23/2024 with weakness, recurrent falls, and orthostatic symptoms.  He is admitted with acute on chronic HFrEF.   Palliative Medicine has been consulted for goals of care discussions and complex medical decision making.   Subjective: Per MAR, in the past 24 hours patient has received IV dilaudid  x 1 dose and prn ativan  x 7 doses.   Objective:  Physical Exam          Vital Signs: BP 130/75 (BP Location: Left Arm)   Pulse (!) 104   Temp 98.4 F (36.9 C) (Oral)   Resp 19   Ht 5' 6 (1.676 m)   Wt 122.7 kg   SpO2 97%   BMI 43.66 kg/m  SpO2: SpO2: 97 % O2 Device: O2 Device: NRB O2 Flow Rate: O2 Flow Rate (L/min): 2 L/min  Intake/output summary:  Intake/Output Summary (Last 24 hours) at 10/01/2024 1423 Last data filed at 10/01/2024 0447 Gross per 24 hour  Intake 0 ml  Output 600 ml  Net -600 ml    LBM: Last BM Date : 09/26/24     Palliative Assessment/Data: ***     Palliative Medicine Assessment & Plan   Assessment: Principal Problem:   Acute on chronic combined systolic and diastolic CHF (congestive heart failure) (HCC) Active Problems:   Hyperlipidemia   Essential hypertension   CAD, AUTOLOGOUS BYPASS GRAFT   BPH with urinary obstruction    Hypothyroidism   History of transcatheter aortic valve replacement (TAVR)   Vertigo   Atypical atrial flutter (HCC)   Biventricular implantable cardioverter-defibrillator (ICD) in situ   NSVT (nonsustained ventricular tachycardia) (HCC)   Chronic kidney disease, stage 3a (HCC)   Type 2 diabetes mellitus (HCC)   COPD (chronic obstructive pulmonary disease) (HCC)   Hypokalemia   Prolonged QT interval   Aspiration  pneumonia (HCC)    Recommendations/Plan: Continue comfort care Lasix  40 mg IV x 1 dose PRN medications are available as per below for symptom management at end-of-life PMT will continue to follow and support  Symptom Management:  Dilaudid  prn for pain or dyspnea Lorazepam  (ATIVAN ) prn for anxiety Haloperidol  (HALDOL ) prn for agitation  Glycopyrrolate  (ROBINUL ) for excessive secretions Ondansetron  (ZOFRAN ) prn for nausea Polyvinyl alcohol  (LIQUIFILM TEARS) prn for dry eyes Antiseptic oral rinse (BIOTENE) prn for dry mouth   Goals of Care and Additional Recommendations: Limitations on Scope of Treatment: {Recommended Scope and Preferences:21019}  Code Status:   Prognosis:  {Palliative Care Prognosis:23504}  Discharge Planning: {Palliative dispostion:23505}  Care plan was discussed with ***  Thank you for allowing the Palliative Medicine Team to assist in the care of this patient.   ***   Recardo KATHEE Loll, NP   Please contact Palliative Medicine Team phone at [phone] for questions and concerns.  For individual providers, please see AMION.      "

## 2024-10-01 NOTE — Progress Notes (Signed)
 OT Cancellation Note  Patient Details Name: Justin [name] MRN: [id number] DOB: October 24, 1933   Cancelled Treatment:    Reason Eval/Treat Not Completed: Other (comment) (Patient transitioning to comfort care, OT signing off.)  Jeb LITTIE Laine 10/01/2024, 12:33 PM  Dick Laine, OTA Acute Rehabilitation Services  Office [phone]

## 2024-10-01 NOTE — Progress Notes (Signed)
 Per pts family--pt would like to be cremated after he passes  [employer]---[phone]--Erin Lander

## 2024-10-01 NOTE — Care Management Important Message (Signed)
 Important Message  Patient Details  Name: Justin [name] MRN: [id number] Date of Birth: 05/31/1934   Important Message Given:  No     [name] 10/01/2024, 10:21 AM

## 2024-10-01 NOTE — Progress Notes (Signed)
 SLP Cancellation and D/C Note  Patient Details Name: Justin [name] MRN: [id number] DOB: April 19, 1934   Cancelled treatment:  Per notes, pt has transitioned to comfort care.  SLP will respectfully sign off.  Quanell Loughney L. Vona, MA CCC/SLP Clinical Specialist - Acute Care SLP Acute Rehabilitation Services Office number [phone]          Justin [name] 10/01/2024, 11:34 AM

## 2024-10-01 NOTE — Progress Notes (Signed)
 [facility]  PROGRESS NOTE  Justin [name] FMW:[id number] DOB: [birth year] DOA: 09/23/2024 PCP: Mast, Man X, NP   Brief HPI:   89/M with chronic systolic CHF with RV failure, permanent atrial fibrillation, TAVR, Medtronic ICD, CAD/CABG, CKD 3, recently admitted with ESBL E. coli urosepsis, AKI CKD 3, GDMT was discontinued, seen in heart failure clinic 1/26 noted to have fluid retention, restarted on torsemide . - Back in the ED 1/29 with recurrent falls, volume overload by OptiVol, labs noted creatinine of 1.3, proBNP 3300, troponin 43, lactate 2.4, CT head with chronic disease.  Patient reports 2 of his falls were where he slid down from a chair and last episode yesterday was a true fall, history of orthostatic symptoms as well - Admitted, started on diuretics, cards following - Palliative care consulted - 2/1, lactate trending up - 2/2 started on milrinone  and Lasix  gtt - 2/5 - made comfort care after discussing with family     Assessment/Plan:   Acute on chronic systolic CHF - Last echo 3/25 with EF 40-45%, moderately reduced RV. Repeat echo this admission with EF 35-40%, grade 3 DD, restrictive, severely reduced RV - Followed by CHF team,  concerns that he may be nearing end-stage, he is DNR - This is his third hospitalization in 1 to 2 months, palliative consulted  - milrinone  was started to aid diuresis -Patient was made full comfort care, off milrinone  - palliative discussed with daughters, and opted for comfort care measures at this point   Frequent falls - Does not have any localizing signs, CT head, C-spine with chronic disease - Has some orthostatic symptoms as well, B12 is normal, orthostatics negative - Likely related to right heart failure, A-fib RVR and general debility   Permanent A-fib -With RVR - Beta-blocker on hold now, continue amiodarone  drip, Pradaxa    CAD/CABG - Continue atorvastatin , Pradaxa    Hypokalemia Repleted and improved now, recheck  afternoon BMP   TAVR - valve stable on TTE this admission   Hypothyroidism - continue levothyroxine     COPD - Stable continue home nebs   Type 2 diabetes mellitus - Recent A1c was 6.2, SSI for now   CKD 3A - Stable, monitor, recent admission with AKI        DVT prophylaxis: Pradaxa   Medications     Chlorhexidine  Gluconate Cloth  6 each Topical Daily   dabigatran   150 mg Oral BID   pantoprazole   40 mg Oral BID   potassium chloride   20 mEq Oral Once   sodium chloride  flush  10-40 mL Intracatheter Q12H   sodium chloride  flush  3 mL Intravenous Q12H   spironolactone   50 mg Oral Daily   tamsulosin   0.4 mg Oral QHS     Data Reviewed:   CBG:  Recent Labs  Lab 09/29/24 1052 09/29/24 1649 09/29/24 2051 09/30/24 0620 09/30/24 1124  GLUCAP 194* 160* 158* 141* 170*    SpO2: 97 % O2 Flow Rate (L/min): 2 L/min FiO2 (%): 28 %    Vitals:   09/30/24 1125 09/30/24 1128 09/30/24 1624 10/01/24 0615  BP: 107/60 107/60 115/63 130/75  Pulse: (!) 106 97  (!) 104  Resp: 20 20 20 19   Temp: 98.1 F (36.7 C)   98.4 F (36.9 C)  TempSrc: Oral   Oral  SpO2: 97% 97%  97%  Weight:      Height:          Data Reviewed:  Basic Metabolic Panel: Recent Labs  Lab 09/26/24 0139 09/26/24  0140 09/28/24 0440 09/28/24 1630 09/29/24 0528 09/29/24 1338 09/29/24 2205 09/30/24 0520 09/30/24 1428 10/01/24 0016  NA  --    < > 137   < > 135 134* 133* 132* 129*  --   K  --    < > 3.4*   < > 2.7* 2.9* 3.1* 4.2 3.9  --   CL  --    < > 96*   < > 92* 92* 91* 92* 90*  --   CO2  --    < > 31   < > 30 32 31 30 28   --   GLUCOSE  --    < > 140*   < > 212* 153* 131* 144* 198*  --   BUN  --    < > 16   < > 16 16 16 16 17   --   CREATININE  --    < > 1.51*   < > 1.48* 1.57* 1.55* 1.63* 1.84*  --   CALCIUM   --    < > 8.4*   < > 8.1* 8.2* 8.1* 8.0* 7.8*  --   MG 2.0  --  2.1  --  1.9  --   --  2.4  --  2.4   < > = values in this interval not displayed.    CBC: Recent Labs  Lab  09/25/24 0238 09/29/24 0528 09/30/24 1006  WBC 9.6 12.7* 13.5*  HGB 10.4* 10.0* 9.8*  HCT 33.3* 31.3* 30.2*  MCV 86.9 85.8 84.8  PLT 281 304 335    LFT No results for input(s): AST, ALT, ALKPHOS, BILITOT, PROT, ALBUMIN  in the last 168 hours.   Antibiotics: Anti-infectives (From admission, onward)    Start     Dose/Rate Route Frequency Ordered Stop   09/23/24 2230  doxycycline  (VIBRA -TABS) tablet 100 mg  Status:  Discontinued        100 mg Oral Every 12 hours 09/23/24 2219 09/24/24 0755   09/23/24 2230  Ampicillin -Sulbactam (UNASYN ) 3 g in sodium chloride  0.9 % 100 mL IVPB  Status:  Discontinued        3 g 200 mL/hr over 30 Minutes Intravenous Every 8 hours 09/23/24 2221 09/24/24 0755   09/23/24 2215  doxycycline  (VIBRAMYCIN ) 100 mg in sodium chloride  0.9 % 250 mL IVPB  Status:  Discontinued        100 mg 125 mL/hr over 120 Minutes Intravenous Every 12 hours 09/23/24 2213 09/23/24 2218        CONSULTS cardiology, palliative care  Code Status: DNR  Family Communication: No family at bedside     Subjective   Patient seen and examined, lethargic.   Objective    Physical Examination:   Lethargic, in no acute distress S1-S2, regular Lungs clear to auscultation bilaterally Abdomen is soft, nontender, no organomegaly             Matasha Smigelski S Ahley Bulls   [facility] If 7PM-7AM, please contact night-coverage at [email or web address], Office  [phone]   10/01/2024, 11:26 AM  LOS: 8 days

## 2024-10-01 NOTE — TOC Progression Note (Signed)
 Transition of Care [facility]) - Progression Note    Patient Details  Name: Justin [name] MRN: [id number] Date of Birth: 10-05-1933  Transition of Care [facility]) CM/SW Contact  Arlana JINNY Nicholaus [place] Phone Number: [phone]  10/01/2024, 4:18 PM  Clinical Narrative:   Call from [facility] ID- [id number]; additional clinical info needed showing medical stability, updated MD note due by 10/01/24 at 1:30 PM. Can be faxed in for review [phone].   Call from [facility]- requesting P2P, [phone] selection option 5, by 10/04/24 by 9:30 AM. CSW notified current attending via secure chat.      [facility] CSW received a call from [facility] about patients auth status.   [facility] CSW/Unit CM will continue to follow and monitor for dc readiness.     Expected Discharge Plan: Assisted Living ([facility]) Barriers to Discharge: Continued Medical Work up               Expected Discharge Plan and Services       Living arrangements for the past 2 months: Assisted Living Facility ([facility])                                       Social Drivers of Health (SDOH) Interventions SDOH Screenings   Food Insecurity: No Food Insecurity (09/24/2024)  Housing: Low Risk (09/24/2024)  Transportation Needs: No Transportation Needs (09/24/2024)  Utilities: Not At Risk (09/24/2024)  Alcohol  Screen: Low Risk (03/31/2023)  Depression (PHQ2-9): Low Risk (04/05/2024)  Financial Resource Strain: Low Risk (03/31/2023)  Physical Activity: Sufficiently Active (03/31/2023)  Social Connections: Unknown (09/24/2024)  Stress: No Stress Concern Present (03/31/2023)  Tobacco Use: Medium Risk (09/23/2024)  Health Literacy: Adequate Health Literacy (03/31/2023)    Readmission Risk Interventions    09/12/2024   10:13 AM 08/05/2024   11:56 AM 11/19/2023    1:00 PM  Readmission Risk Prevention Plan  Transportation Screening Complete Complete Complete  PCP or Specialist Appt within 3-5 Days   Complete  HRI or Home Care Consult   Complete   Social Work Consult for Recovery Care Planning/Counseling   Complete  Palliative Care Screening   Not Applicable  Medication Review Oceanographer) Complete Complete Complete  PCP or Specialist appointment within 3-5 days of discharge Complete Complete   HRI or Home Care Consult Complete Complete   SW Recovery Care/Counseling Consult Complete Complete   Palliative Care Screening Not Applicable Not Applicable   Skilled Nursing Facility Complete Not Applicable

## 2024-10-04 ENCOUNTER — Ambulatory Visit

## 2024-10-27 ENCOUNTER — Ambulatory Visit: Admitting: Cardiology

## 2024-12-13 ENCOUNTER — Ambulatory Visit: Admitting: Pulmonary Disease

## 2025-08-25 ENCOUNTER — Inpatient Hospital Stay: Admitting: Orthopedic Surgery
